# Patient Record
Sex: Female | Born: 1960 | Race: Black or African American | Hispanic: No | Marital: Single | Smoking: Former smoker
Health system: Southern US, Community
[De-identification: ages and names within clinical notes are randomized; demographics above are authoritative.]

## PROBLEM LIST (undated history)

## (undated) DIAGNOSIS — L8990 Pressure ulcer of unspecified site, unspecified stage: Secondary | ICD-10-CM

## (undated) DIAGNOSIS — I10 Essential (primary) hypertension: Secondary | ICD-10-CM

## (undated) DIAGNOSIS — G35 Multiple sclerosis: Secondary | ICD-10-CM

## (undated) DIAGNOSIS — F32A Depression, unspecified: Secondary | ICD-10-CM

## (undated) DIAGNOSIS — I428 Other cardiomyopathies: Secondary | ICD-10-CM

## (undated) DIAGNOSIS — E2740 Unspecified adrenocortical insufficiency: Secondary | ICD-10-CM

## (undated) DIAGNOSIS — F329 Major depressive disorder, single episode, unspecified: Secondary | ICD-10-CM

## (undated) DIAGNOSIS — I5020 Unspecified systolic (congestive) heart failure: Secondary | ICD-10-CM

## (undated) DIAGNOSIS — I5022 Chronic systolic (congestive) heart failure: Secondary | ICD-10-CM

## (undated) DIAGNOSIS — E119 Type 2 diabetes mellitus without complications: Secondary | ICD-10-CM

## (undated) DIAGNOSIS — D638 Anemia in other chronic diseases classified elsewhere: Secondary | ICD-10-CM

## (undated) DIAGNOSIS — I471 Supraventricular tachycardia, unspecified: Secondary | ICD-10-CM

## (undated) DIAGNOSIS — I429 Cardiomyopathy, unspecified: Secondary | ICD-10-CM

## (undated) DIAGNOSIS — I700 Atherosclerosis of aorta: Secondary | ICD-10-CM

## (undated) DIAGNOSIS — H409 Unspecified glaucoma: Secondary | ICD-10-CM

## (undated) HISTORY — PX: ABLATION: SHX5711

## (undated) HISTORY — DX: Chronic systolic (congestive) heart failure: I50.22

## (undated) HISTORY — DX: Cardiomyopathy, unspecified: I42.9

---

## 1997-02-03 ENCOUNTER — Encounter (INDEPENDENT_AMBULATORY_CARE_PROVIDER_SITE_OTHER): Payer: Self-pay | Admitting: Nurse Practitioner

## 1997-02-03 LAB — CONVERTED CEMR LAB

## 1997-10-04 ENCOUNTER — Other Ambulatory Visit: Admission: RE | Admit: 1997-10-04 | Discharge: 1997-10-04 | Payer: Self-pay | Admitting: Family Medicine

## 1997-11-01 ENCOUNTER — Other Ambulatory Visit: Admission: RE | Admit: 1997-11-01 | Discharge: 1997-11-01 | Payer: Self-pay

## 1999-05-12 ENCOUNTER — Inpatient Hospital Stay (HOSPITAL_COMMUNITY): Admission: EM | Admit: 1999-05-12 | Discharge: 1999-05-17 | Payer: Self-pay

## 1999-05-13 ENCOUNTER — Encounter: Payer: Self-pay | Admitting: Neurology

## 2000-01-11 ENCOUNTER — Ambulatory Visit (HOSPITAL_COMMUNITY): Admission: RE | Admit: 2000-01-11 | Discharge: 2000-01-11 | Payer: Self-pay | Admitting: Internal Medicine

## 2000-01-18 ENCOUNTER — Encounter: Payer: Self-pay | Admitting: Internal Medicine

## 2000-01-18 ENCOUNTER — Encounter: Admission: RE | Admit: 2000-01-18 | Discharge: 2000-01-18 | Payer: Self-pay | Admitting: Internal Medicine

## 2000-01-30 ENCOUNTER — Emergency Department (HOSPITAL_COMMUNITY): Admission: EM | Admit: 2000-01-30 | Discharge: 2000-01-30 | Payer: Self-pay

## 2000-02-20 ENCOUNTER — Ambulatory Visit (HOSPITAL_COMMUNITY): Admission: RE | Admit: 2000-02-20 | Discharge: 2000-02-20 | Payer: Self-pay | Admitting: *Deleted

## 2000-02-20 ENCOUNTER — Encounter: Payer: Self-pay | Admitting: *Deleted

## 2000-07-22 ENCOUNTER — Encounter: Payer: Self-pay | Admitting: Internal Medicine

## 2000-07-22 ENCOUNTER — Encounter: Admission: RE | Admit: 2000-07-22 | Discharge: 2000-07-22 | Payer: Self-pay | Admitting: Internal Medicine

## 2000-09-10 ENCOUNTER — Encounter: Admission: RE | Admit: 2000-09-10 | Discharge: 2000-09-10 | Payer: Self-pay | Admitting: Obstetrics & Gynecology

## 2000-11-29 ENCOUNTER — Encounter: Admission: RE | Admit: 2000-11-29 | Discharge: 2000-11-29 | Payer: Self-pay | Admitting: Obstetrics & Gynecology

## 2000-12-30 ENCOUNTER — Ambulatory Visit (HOSPITAL_COMMUNITY): Admission: RE | Admit: 2000-12-30 | Discharge: 2000-12-30 | Payer: Self-pay | Admitting: Obstetrics & Gynecology

## 2001-02-13 ENCOUNTER — Encounter: Payer: Self-pay | Admitting: Family Medicine

## 2001-02-13 ENCOUNTER — Encounter: Admission: RE | Admit: 2001-02-13 | Discharge: 2001-02-13 | Payer: Self-pay | Admitting: Family Medicine

## 2002-02-20 ENCOUNTER — Encounter: Admission: RE | Admit: 2002-02-20 | Discharge: 2002-02-20 | Payer: Self-pay | Admitting: Family Medicine

## 2002-02-20 ENCOUNTER — Encounter: Payer: Self-pay | Admitting: Family Medicine

## 2003-04-01 ENCOUNTER — Encounter: Admission: RE | Admit: 2003-04-01 | Discharge: 2003-04-01 | Payer: Self-pay | Admitting: Family Medicine

## 2003-04-01 ENCOUNTER — Encounter: Payer: Self-pay | Admitting: Family Medicine

## 2003-09-13 ENCOUNTER — Encounter: Admission: RE | Admit: 2003-09-13 | Discharge: 2003-09-13 | Payer: Self-pay | Admitting: Obstetrics & Gynecology

## 2004-04-07 ENCOUNTER — Encounter: Admission: RE | Admit: 2004-04-07 | Discharge: 2004-04-07 | Payer: Self-pay | Admitting: Family Medicine

## 2005-01-12 ENCOUNTER — Ambulatory Visit: Payer: Self-pay | Admitting: Internal Medicine

## 2005-01-19 ENCOUNTER — Ambulatory Visit: Payer: Self-pay | Admitting: Internal Medicine

## 2005-03-14 ENCOUNTER — Ambulatory Visit: Payer: Self-pay | Admitting: Nurse Practitioner

## 2005-10-26 ENCOUNTER — Encounter: Admission: RE | Admit: 2005-10-26 | Discharge: 2005-10-26 | Payer: Self-pay | Admitting: Family Medicine

## 2005-11-20 ENCOUNTER — Other Ambulatory Visit: Admission: RE | Admit: 2005-11-20 | Discharge: 2005-11-20 | Payer: Self-pay | Admitting: Obstetrics and Gynecology

## 2006-04-18 ENCOUNTER — Ambulatory Visit: Payer: Self-pay | Admitting: Nurse Practitioner

## 2006-06-01 ENCOUNTER — Ambulatory Visit (HOSPITAL_COMMUNITY): Admission: RE | Admit: 2006-06-01 | Discharge: 2006-06-01 | Payer: Self-pay | Admitting: Neurology

## 2007-04-24 ENCOUNTER — Ambulatory Visit: Payer: Self-pay | Admitting: Family Medicine

## 2007-04-24 ENCOUNTER — Encounter (INDEPENDENT_AMBULATORY_CARE_PROVIDER_SITE_OTHER): Payer: Self-pay | Admitting: Nurse Practitioner

## 2007-04-24 LAB — CONVERTED CEMR LAB
ALT: 17 units/L (ref 0–35)
AST: 15 units/L (ref 0–37)
Albumin: 4.1 g/dL (ref 3.5–5.2)
Alkaline Phosphatase: 88 units/L (ref 39–117)
BUN: 12 mg/dL (ref 6–23)
Basophils Absolute: 0.1 10*3/uL (ref 0.0–0.1)
Chloride: 105 meq/L (ref 96–112)
Eosinophils Relative: 6 % — ABNORMAL HIGH (ref 0–5)
HCT: 32.4 % — ABNORMAL LOW (ref 36.0–46.0)
MCHC: 34.3 g/dL (ref 30.0–36.0)
MCV: 79 fL (ref 78.0–100.0)
Monocytes Relative: 10 % (ref 3–11)
Neutro Abs: 3.2 10*3/uL (ref 1.7–7.7)
Neutrophils Relative %: 64 % (ref 43–77)
Sodium: 142 meq/L (ref 135–145)
Total Protein: 7.4 g/dL (ref 6.0–8.3)
WBC: 5.1 10*3/uL (ref 4.0–10.5)

## 2007-06-16 ENCOUNTER — Encounter: Admission: RE | Admit: 2007-06-16 | Discharge: 2007-06-16 | Payer: Self-pay | Admitting: Family Medicine

## 2008-04-11 ENCOUNTER — Emergency Department (HOSPITAL_COMMUNITY): Admission: EM | Admit: 2008-04-11 | Discharge: 2008-04-11 | Payer: Self-pay | Admitting: Emergency Medicine

## 2008-07-23 ENCOUNTER — Encounter: Admission: RE | Admit: 2008-07-23 | Discharge: 2008-07-23 | Payer: Self-pay | Admitting: Nephrology

## 2008-08-11 ENCOUNTER — Encounter (INDEPENDENT_AMBULATORY_CARE_PROVIDER_SITE_OTHER): Payer: Self-pay | Admitting: Nurse Practitioner

## 2008-08-16 ENCOUNTER — Encounter (INDEPENDENT_AMBULATORY_CARE_PROVIDER_SITE_OTHER): Payer: Self-pay | Admitting: Nurse Practitioner

## 2008-10-12 ENCOUNTER — Encounter: Admission: RE | Admit: 2008-10-12 | Discharge: 2009-01-10 | Payer: Self-pay | Admitting: Neurology

## 2008-11-30 ENCOUNTER — Encounter (INDEPENDENT_AMBULATORY_CARE_PROVIDER_SITE_OTHER): Payer: Self-pay | Admitting: Nurse Practitioner

## 2008-11-30 DIAGNOSIS — D539 Nutritional anemia, unspecified: Secondary | ICD-10-CM

## 2008-11-30 DIAGNOSIS — G35 Multiple sclerosis: Secondary | ICD-10-CM | POA: Insufficient documentation

## 2008-11-30 DIAGNOSIS — L259 Unspecified contact dermatitis, unspecified cause: Secondary | ICD-10-CM | POA: Insufficient documentation

## 2008-11-30 DIAGNOSIS — I110 Hypertensive heart disease with heart failure: Secondary | ICD-10-CM

## 2008-11-30 DIAGNOSIS — E669 Obesity, unspecified: Secondary | ICD-10-CM

## 2009-05-19 ENCOUNTER — Emergency Department (HOSPITAL_COMMUNITY): Admission: EM | Admit: 2009-05-19 | Discharge: 2009-05-19 | Payer: Self-pay | Admitting: Emergency Medicine

## 2009-08-12 ENCOUNTER — Encounter: Admission: RE | Admit: 2009-08-12 | Discharge: 2009-08-12 | Payer: Self-pay | Admitting: Nephrology

## 2009-10-10 ENCOUNTER — Ambulatory Visit (HOSPITAL_COMMUNITY): Admission: RE | Admit: 2009-10-10 | Discharge: 2009-10-10 | Payer: Self-pay | Admitting: Obstetrics and Gynecology

## 2010-03-31 ENCOUNTER — Encounter: Admission: RE | Admit: 2010-03-31 | Discharge: 2010-04-14 | Payer: Self-pay | Admitting: Neurology

## 2010-07-23 ENCOUNTER — Encounter: Payer: Self-pay | Admitting: Obstetrics & Gynecology

## 2010-07-31 ENCOUNTER — Other Ambulatory Visit: Payer: Self-pay | Admitting: Nephrology

## 2010-07-31 DIAGNOSIS — Z1239 Encounter for other screening for malignant neoplasm of breast: Secondary | ICD-10-CM

## 2010-08-21 ENCOUNTER — Ambulatory Visit: Payer: Self-pay

## 2010-09-05 ENCOUNTER — Ambulatory Visit (HOSPITAL_COMMUNITY)
Admission: RE | Admit: 2010-09-05 | Discharge: 2010-09-05 | Disposition: A | Discharge status: Home / Self Care | Payer: Medicare Other | Source: Ambulatory Visit | Attending: Nephrology | Admitting: Nephrology

## 2010-09-05 DIAGNOSIS — Z79899 Other long term (current) drug therapy: Secondary | ICD-10-CM | POA: Insufficient documentation

## 2010-09-05 DIAGNOSIS — E785 Hyperlipidemia, unspecified: Secondary | ICD-10-CM | POA: Insufficient documentation

## 2010-09-05 DIAGNOSIS — I10 Essential (primary) hypertension: Secondary | ICD-10-CM | POA: Insufficient documentation

## 2010-09-05 DIAGNOSIS — G35 Multiple sclerosis: Secondary | ICD-10-CM | POA: Insufficient documentation

## 2010-09-20 LAB — HCG, SERUM, QUALITATIVE: Preg, Serum: NEGATIVE

## 2010-09-20 LAB — CBC
HCT: 33.6 % — ABNORMAL LOW (ref 36.0–46.0)
MCV: 78.6 fL (ref 78.0–100.0)
Platelets: 271 10*3/uL (ref 150–400)

## 2010-10-04 LAB — CBC
HCT: 31 % — ABNORMAL LOW (ref 36.0–46.0)
RDW: 16.9 % — ABNORMAL HIGH (ref 11.5–15.5)

## 2010-10-04 LAB — COMPREHENSIVE METABOLIC PANEL
ALT: 14 U/L (ref 0–35)
Alkaline Phosphatase: 94 U/L (ref 39–117)
BUN: 8 mg/dL (ref 6–23)
Calcium: 8.7 mg/dL (ref 8.4–10.5)
Creatinine, Ser: 0.78 mg/dL (ref 0.4–1.2)
GFR calc non Af Amer: >60 mL/min (ref >60)
Glucose, Bld: 152 mg/dL — ABNORMAL HIGH (ref 70–99)
Sodium: 138 mEq/L (ref 135–145)
Total Bilirubin: 0.4 mg/dL (ref 0.3–1.2)

## 2010-10-04 LAB — URINALYSIS, ROUTINE W REFLEX MICROSCOPIC
Bilirubin Urine: NEGATIVE
Glucose, UA: NEGATIVE mg/dL
Hgb urine dipstick: NEGATIVE
Specific Gravity, Urine: 1.014 (ref 1.005–1.030)
Urobilinogen, UA: 0.2 mg/dL (ref 0.0–1.0)
pH: 6.5 (ref 5.0–8.0)

## 2010-10-04 LAB — CK TOTAL AND CKMB (NOT AT ARMC)
CK, MB: 2 ng/mL (ref 0.3–4.0)
Relative Index: 1.9 (ref 0.0–2.5)

## 2010-10-04 LAB — DIFFERENTIAL
Eosinophils Relative: 8 % — ABNORMAL HIGH (ref 0–5)
Lymphs Abs: 1.3 10*3/uL (ref 0.7–4.0)
Monocytes Absolute: 0.5 10*3/uL (ref 0.1–1.0)
Monocytes Relative: 7 % (ref 3–12)
Neutrophils Relative %: 64 % (ref 43–77)

## 2010-12-27 ENCOUNTER — Ambulatory Visit (HOSPITAL_COMMUNITY)
Admission: RE | Admit: 2010-12-27 | Discharge: 2010-12-27 | Disposition: A | Discharge status: Home / Self Care | Payer: PRIVATE HEALTH INSURANCE | Source: Ambulatory Visit | Attending: Neurology | Admitting: Neurology

## 2010-12-27 DIAGNOSIS — C50919 Malignant neoplasm of unspecified site of unspecified female breast: Secondary | ICD-10-CM | POA: Insufficient documentation

## 2010-12-27 DIAGNOSIS — I10 Essential (primary) hypertension: Secondary | ICD-10-CM | POA: Insufficient documentation

## 2010-12-27 DIAGNOSIS — I359 Nonrheumatic aortic valve disorder, unspecified: Secondary | ICD-10-CM

## 2010-12-27 DIAGNOSIS — Z01818 Encounter for other preprocedural examination: Secondary | ICD-10-CM | POA: Insufficient documentation

## 2010-12-27 DIAGNOSIS — I080 Rheumatic disorders of both mitral and aortic valves: Secondary | ICD-10-CM | POA: Insufficient documentation

## 2011-04-03 LAB — POCT I-STAT, CHEM 8
BUN: 7
Creatinine, Ser: 0.9
Glucose, Bld: 91
HCT: 36
Potassium: 3.2 — ABNORMAL LOW
Sodium: 142
TCO2: 30

## 2011-04-03 LAB — URINALYSIS, ROUTINE W REFLEX MICROSCOPIC
Bilirubin Urine: NEGATIVE
Hgb urine dipstick: NEGATIVE
Ketones, ur: NEGATIVE
Protein, ur: NEGATIVE
Specific Gravity, Urine: 1.015
pH: 6

## 2011-04-03 LAB — URINE CULTURE

## 2011-04-03 LAB — URINE MICROSCOPIC-ADD ON

## 2011-04-18 ENCOUNTER — Emergency Department (HOSPITAL_COMMUNITY)
Admission: EM | Admit: 2011-04-18 | Discharge: 2011-04-18 | Disposition: A | Discharge status: Home / Self Care | Payer: PRIVATE HEALTH INSURANCE | Attending: Emergency Medicine | Admitting: Emergency Medicine

## 2011-04-18 ENCOUNTER — Emergency Department (HOSPITAL_COMMUNITY): Payer: PRIVATE HEALTH INSURANCE

## 2011-04-18 DIAGNOSIS — Y92009 Unspecified place in unspecified non-institutional (private) residence as the place of occurrence of the external cause: Secondary | ICD-10-CM | POA: Insufficient documentation

## 2011-04-18 DIAGNOSIS — Z79899 Other long term (current) drug therapy: Secondary | ICD-10-CM | POA: Insufficient documentation

## 2011-04-18 DIAGNOSIS — I10 Essential (primary) hypertension: Secondary | ICD-10-CM | POA: Insufficient documentation

## 2011-04-18 DIAGNOSIS — S82899A Other fracture of unspecified lower leg, initial encounter for closed fracture: Secondary | ICD-10-CM | POA: Insufficient documentation

## 2011-04-18 DIAGNOSIS — W19XXXA Unspecified fall, initial encounter: Secondary | ICD-10-CM | POA: Insufficient documentation

## 2011-04-18 DIAGNOSIS — Y998 Other external cause status: Secondary | ICD-10-CM | POA: Insufficient documentation

## 2011-04-18 LAB — COMPREHENSIVE METABOLIC PANEL
ALT: 20 U/L (ref 0–35)
AST: 20 U/L (ref 0–37)
Albumin: 3.3 g/dL — ABNORMAL LOW (ref 3.5–5.2)
Alkaline Phosphatase: 131 U/L — ABNORMAL HIGH (ref 39–117)
Potassium: 3 mEq/L — ABNORMAL LOW (ref 3.5–5.1)
Sodium: 141 mEq/L (ref 135–145)
Total Protein: 7 g/dL (ref 6.0–8.3)

## 2011-04-18 LAB — DIFFERENTIAL
Basophils Absolute: 0 10*3/uL (ref 0.0–0.1)
Lymphocytes Relative: 12 % (ref 12–46)
Lymphs Abs: 0.6 10*3/uL — ABNORMAL LOW (ref 0.7–4.0)
Monocytes Absolute: 0.3 10*3/uL (ref 0.1–1.0)
Neutro Abs: 3.6 10*3/uL (ref 1.7–7.7)

## 2011-04-18 LAB — CBC
HCT: 33.8 % — ABNORMAL LOW (ref 36.0–46.0)
Hemoglobin: 11.4 g/dL — ABNORMAL LOW (ref 12.0–15.0)
MCV: 79.7 fL (ref 78.0–100.0)
WBC: 4.9 10*3/uL (ref 4.0–10.5)

## 2011-04-18 LAB — URINALYSIS, ROUTINE W REFLEX MICROSCOPIC
Glucose, UA: NEGATIVE mg/dL
Hgb urine dipstick: NEGATIVE
Specific Gravity, Urine: 1.008 (ref 1.005–1.030)

## 2011-04-18 LAB — CK TOTAL AND CKMB (NOT AT ARMC)
CK, MB: 3.4 ng/mL (ref 0.3–4.0)
Relative Index: 1.1 (ref 0.0–2.5)
Total CK: 303 U/L — ABNORMAL HIGH (ref 7–177)

## 2011-06-01 ENCOUNTER — Encounter: Payer: Self-pay | Admitting: Emergency Medicine

## 2011-06-01 ENCOUNTER — Emergency Department (HOSPITAL_COMMUNITY)
Admission: EM | Admit: 2011-06-01 | Discharge: 2011-06-01 | Disposition: A | Discharge status: Home / Self Care | Payer: Medicare Other | Attending: Emergency Medicine | Admitting: Emergency Medicine

## 2011-06-01 ENCOUNTER — Emergency Department (HOSPITAL_COMMUNITY): Payer: Medicare Other

## 2011-06-01 DIAGNOSIS — G35 Multiple sclerosis: Secondary | ICD-10-CM | POA: Insufficient documentation

## 2011-06-01 DIAGNOSIS — M25569 Pain in unspecified knee: Secondary | ICD-10-CM | POA: Insufficient documentation

## 2011-06-01 DIAGNOSIS — W19XXXA Unspecified fall, initial encounter: Secondary | ICD-10-CM

## 2011-06-01 DIAGNOSIS — W06XXXA Fall from bed, initial encounter: Secondary | ICD-10-CM | POA: Insufficient documentation

## 2011-06-01 DIAGNOSIS — M25579 Pain in unspecified ankle and joints of unspecified foot: Secondary | ICD-10-CM | POA: Insufficient documentation

## 2011-06-01 DIAGNOSIS — S82409A Unspecified fracture of shaft of unspecified fibula, initial encounter for closed fracture: Secondary | ICD-10-CM

## 2011-06-01 DIAGNOSIS — R51 Headache: Secondary | ICD-10-CM | POA: Insufficient documentation

## 2011-06-01 DIAGNOSIS — S82899A Other fracture of unspecified lower leg, initial encounter for closed fracture: Secondary | ICD-10-CM | POA: Insufficient documentation

## 2011-06-01 MED ORDER — OXYCODONE-ACETAMINOPHEN 5-325 MG PO TABS
2.0000 | ORAL_TABLET | Freq: Once | ORAL | Status: AC
  Administered 2011-06-01: 2 via ORAL
  Filled 2011-06-01: qty 2

## 2011-06-01 MED ORDER — HYDROCODONE-ACETAMINOPHEN 5-325 MG PO TABS: 2.0000 | ORAL_TABLET | ORAL | Status: AC | PRN

## 2011-06-01 NOTE — ED Notes (Signed)
Left Knee immobilizer applied--Pt. Tolerated well

## 2011-06-01 NOTE — ED Notes (Signed)
Pt. Has MS and per home health nurse-she fell trying to get out of bed--pt. Is alert and oriented---c/o left knee, right ankle pain and headache from hitting head on the furniture during fall.  No obvious areas of injury and no LOC

## 2011-06-01 NOTE — ED Notes (Signed)
Patient transported to X-ray 

## 2011-06-01 NOTE — ED Notes (Signed)
Ambulance called to transport pt home

## 2011-06-01 NOTE — ED Provider Notes (Signed)
History     CSN: [id number] Arrival date & time: 06/01/2011  2:43 PM   First MD Initiated Contact with Patient 06/01/11 1453      Chief Complaint  Patient presents with  . Fall    (Consider location/radiation/quality/duration/timing/severity/associated sxs/prior treatment) HPI Comments: Presents after mechanical fall trying to get out of bed. She has multiple sclerosis. Her aide Asked her to sit down but she lost balance and she fell before she could do so. She hit her head on the dresser is uncertain if he lost consciousness. His pain the back of her head, left knee and right ankle.  She denies any nausea, vomiting, chest pain, shortness of breath, lightheadedness or dizziness. She is minimally ambulatory at baseline with a walker and scooter.  She is not using any Coumadin or aspirin.  Denies any weakness, numbness, tingling, bowel or bladder incontinence. She does not have any C-spine pain, back pain, abdominal pain or chest pain. She has not tried to walk since the fall. She took some naproxen for pain which was minimal relief.  She has a previous fracture of R distal fibula.  The history is provided by the patient and the EMS personnel.    History reviewed. No pertinent past medical history.  History reviewed. No pertinent past surgical history.  History reviewed. No pertinent family history.  History  Substance Use Topics  . Smoking status: Not on file  . Smokeless tobacco: Not on file  . Alcohol Use: Not on file    OB History    Grav Para Term Preterm Abortions TAB SAB Ect Mult Living                  Review of Systems  All other systems reviewed and are negative.    Allergies  Sulfonamide derivatives and Penicillins  Home Medications   Current Outpatient Rx  Name Route Sig Dispense Refill  . AMANTADINE HCL 100 MG PO CAPS Oral Take 100 mg by mouth 2 (two) times daily.      Marland Kitchen VITAMIN D3 PO Oral Take 1 tablet by mouth daily.      Marland Kitchen CLONAZEPAM 1 MG PO TABS  Oral Take 2 mg by mouth at bedtime.      Marland Kitchen VITAMIN B12 PO Oral Take 1 tablet by mouth daily.      . FUROSEMIDE 40 MG PO TABS Oral Take 40 mg by mouth daily.      Marland Kitchen GABAPENTIN 600 MG PO TABS Oral Take 600 mg by mouth 3 (three) times daily.      . IBUPROFEN 200 MG PO TABS Oral Take 400 mg by mouth every 8 (eight) hours as needed. For pain.     . IMIPRAMINE HCL 50 MG PO TABS Oral Take 100 mg by mouth at bedtime.      . SOLIFENACIN SUCCINATE 10 MG PO TABS Oral Take 5 mg by mouth 2 (two) times daily.      . TRAVOPROST (BAK FREE) 0.004 % OP SOLN  1 drop at bedtime.      Marland Kitchen ZOLPIDEM TARTRATE 5 MG PO TABS Oral Take 5 mg by mouth at bedtime.      Marland Kitchen HYDROCODONE-ACETAMINOPHEN 5-325 MG PO TABS Oral Take 2 tablets by mouth every 4 (four) hours as needed for pain. 10 tablet 0    BP 160/103  Pulse 89  Temp(Src) 98.4 F (36.9 C) (Oral)  Resp 22  SpO2 98%  Physical Exam  Constitutional: She is oriented to person, place, and  time. She appears well-developed and well-nourished. No distress.  HENT:  Head: Normocephalic and atraumatic.  Right Ear: External ear normal.  Left Ear: External ear normal.  Mouth/Throat: Oropharynx is clear and moist. No oropharyngeal exudate.       No septal hematoma or hemotympanum  Eyes: Conjunctivae and EOM are normal. Pupils are equal, round, and reactive to light.  Neck: Normal range of motion. Neck supple.       No C-spine pain, step-off or deformity  Cardiovascular: Normal rate, regular rhythm and normal heart sounds.   Pulmonary/Chest: Effort normal and breath sounds normal. No respiratory distress.  Abdominal: Soft. There is no tenderness. There is no rebound and no guarding.  Musculoskeletal: Normal range of motion. She exhibits tenderness.       Tenderness to palpation diffusely over left patella. Her range of motion is limited at baseline given her MS. She is able to passively flex and extend the knee and she is able to actively extend it. +2 DP and PT pulses.  No  appreciable ligament instability but limited by body habitus.  Tenderness palpation of the right lateral malleolus without deformity. His foot is neurovascularly intact with +2 DP and PT pulses  Neurological: She is alert and oriented to person, place, and time. No cranial nerve deficit.  Skin: Skin is warm.    ED Course  Procedures (including critical care time)  Labs Reviewed - No data to display Dg Ankle Complete Right  06/01/2011  *RADIOLOGY REPORT*  Clinical Data: Larey Seat today with pain  RIGHT ANKLE - COMPLETE 3+ VIEW  Comparison: Right ankle films of 04/18/2011  Findings: There appears be widening of the fracture line through the distal right fibula with slight displacement.  This may indicate refracture through the previous fracture site.  The ankle joint appears normal.  IMPRESSION: Suspect refracture through the oblique distal right fibular fracture site.  Original Report Authenticated By: Juline Patch, M.D.   Ct Head Wo Contrast  06/01/2011  *RADIOLOGY REPORT*  Clinical Data: Fall striking back of head, headache, weakness, history of multiple sclerosis  CT HEAD WITHOUT CONTRAST  Technique:  Contiguous axial images were obtained from the base of the skull through the vertex without contrast.  Comparison: None Correlation:  MRI brain 06/01/2006  Findings: Minimal atrophy. Normal ventricular morphology. No midline shift or mass effect. Extensive low attenuation throughout deep cerebral white matter. This could be related to small vessel chronic ischemic changes or multiple sclerosis.  No intracranial hemorrhage, mass lesion or evidence of acute infarction. No extra-axial fluid collections. Visualized paranasal sinuses and mastoid air cells clear. Skull intact.  IMPRESSION: Extensive white matter hypoattenuation which may represent small vessel chronic ischemic changes or could be related to patient's history of multiple sclerosis, with similar changes noted on prior MR. No other intracranial  abnormalities.  Original Report Authenticated By: Lollie Marrow, M.D.   Dg Knee Complete 4 Views Left  06/01/2011  *RADIOLOGY REPORT*  Clinical Data: Fall, left knee pain.  LEFT KNEE - COMPLETE 4+ VIEW  Comparison: None  Findings: No acute bony abnormality.  Specifically, no fracture, subluxation, or dislocation.  Soft tissues are intact.  No joint effusion.  IMPRESSION: No acute bony abnormality.  Original Report Authenticated By: Cyndie Chime, M.D.     1. Fibula fracture   2. Fall       MDM  Mechanical fall with questionable loss of consciousness, knee pain and ankle pain. Patient is alert and oriented and at her neurological  baseline. Check plain films of her knee and ankle and treat her pain.   Results discussed with patient including refracture of R fibula with slight displacement.  Patient has seen Dr. Elvin So of podiatry regarding this fracture.  I discussed this with Dr. Elvin So. As patient is minimally ambulatory at baseline will not pursue aggressive fracture management.  He agrees with fracture boot and outpatient followup.  Patient has followup appointment on Dec 12.   Glynn Octave, MD 06/01/11 [phone]

## 2011-11-19 ENCOUNTER — Encounter (HOSPITAL_COMMUNITY): Payer: Self-pay | Admitting: Emergency Medicine

## 2011-11-19 ENCOUNTER — Emergency Department (HOSPITAL_COMMUNITY)
Admission: EM | Admit: 2011-11-19 | Discharge: 2011-11-19 | Disposition: A | Discharge status: Home / Self Care | Payer: Medicare Other | Attending: Emergency Medicine | Admitting: Emergency Medicine

## 2011-11-19 ENCOUNTER — Emergency Department (HOSPITAL_COMMUNITY): Payer: Medicare Other

## 2011-11-19 DIAGNOSIS — K0889 Other specified disorders of teeth and supporting structures: Secondary | ICD-10-CM

## 2011-11-19 DIAGNOSIS — H409 Unspecified glaucoma: Secondary | ICD-10-CM | POA: Insufficient documentation

## 2011-11-19 DIAGNOSIS — K029 Dental caries, unspecified: Secondary | ICD-10-CM | POA: Insufficient documentation

## 2011-11-19 DIAGNOSIS — G35 Multiple sclerosis: Secondary | ICD-10-CM | POA: Insufficient documentation

## 2011-11-19 DIAGNOSIS — K1370 Unspecified lesions of oral mucosa: Secondary | ICD-10-CM | POA: Insufficient documentation

## 2011-11-19 DIAGNOSIS — R221 Localized swelling, mass and lump, neck: Secondary | ICD-10-CM | POA: Insufficient documentation

## 2011-11-19 DIAGNOSIS — E876 Hypokalemia: Secondary | ICD-10-CM | POA: Insufficient documentation

## 2011-11-19 DIAGNOSIS — K089 Disorder of teeth and supporting structures, unspecified: Secondary | ICD-10-CM | POA: Insufficient documentation

## 2011-11-19 DIAGNOSIS — I10 Essential (primary) hypertension: Secondary | ICD-10-CM | POA: Insufficient documentation

## 2011-11-19 DIAGNOSIS — R220 Localized swelling, mass and lump, head: Secondary | ICD-10-CM | POA: Insufficient documentation

## 2011-11-19 HISTORY — DX: Multiple sclerosis: G35

## 2011-11-19 HISTORY — DX: Major depressive disorder, single episode, unspecified: F32.9

## 2011-11-19 HISTORY — DX: Depression, unspecified: F32.A

## 2011-11-19 HISTORY — DX: Essential (primary) hypertension: I10

## 2011-11-19 LAB — DIFFERENTIAL
Basophils Absolute: 0 10*3/uL (ref 0.0–0.1)
Basophils Relative: 1 % (ref 0–1)
Eosinophils Relative: 5 % (ref 0–5)
Monocytes Absolute: 0.2 10*3/uL (ref 0.1–1.0)
Monocytes Relative: 5 % (ref 3–12)

## 2011-11-19 LAB — CBC
HCT: 34 % — ABNORMAL LOW (ref 36.0–46.0)
MCH: 28.1 pg (ref 26.0–34.0)
MCHC: 34.4 g/dL (ref 30.0–36.0)
MCV: 81.7 fL (ref 78.0–100.0)
RDW: 14.9 % (ref 11.5–15.5)

## 2011-11-19 LAB — BASIC METABOLIC PANEL
BUN: 14 mg/dL (ref 6–23)
Calcium: 8.1 mg/dL — ABNORMAL LOW (ref 8.4–10.5)
Creatinine, Ser: 0.83 mg/dL (ref 0.50–1.10)
GFR calc Af Amer: >90 mL/min (ref >90)

## 2011-11-19 MED ORDER — OXYCODONE-ACETAMINOPHEN 5-325 MG PO TABS
1.0000 | ORAL_TABLET | Freq: Once | ORAL | Status: AC
  Administered 2011-11-19: 1 via ORAL
  Filled 2011-11-19: qty 1

## 2011-11-19 MED ORDER — CLINDAMYCIN HCL 150 MG PO CAPS: 150.0000 mg | ORAL_CAPSULE | Freq: Four times a day (QID) | ORAL | Status: AC

## 2011-11-19 MED ORDER — IOHEXOL 300 MG/ML  SOLN
100.0000 mL | Freq: Once | INTRAMUSCULAR | Status: AC | PRN
  Administered 2011-11-19: 100 mL via INTRAVENOUS

## 2011-11-19 MED ORDER — SODIUM CHLORIDE 0.9 % IV SOLN
Freq: Once | INTRAVENOUS | Status: AC
  Administered 2011-11-19: 18:00:00 via INTRAVENOUS

## 2011-11-19 MED ORDER — CLINDAMYCIN PHOSPHATE 600 MG/50ML IV SOLN
600.0000 mg | Freq: Once | INTRAVENOUS | Status: AC
  Administered 2011-11-19: 600 mg via INTRAVENOUS
  Filled 2011-11-19: qty 50

## 2011-11-19 MED ORDER — POTASSIUM CHLORIDE CRYS ER 20 MEQ PO TBCR
40.0000 meq | EXTENDED_RELEASE_TABLET | Freq: Once | ORAL | Status: AC
  Administered 2011-11-19: 40 meq via ORAL
  Filled 2011-11-19: qty 2

## 2011-11-19 MED ORDER — OXYCODONE-ACETAMINOPHEN 5-325 MG PO TABS: 1.0000 | ORAL_TABLET | ORAL | Status: AC | PRN

## 2011-11-19 MED ORDER — DEXAMETHASONE SODIUM PHOSPHATE 10 MG/ML IJ SOLN
10.0000 mg | Freq: Once | INTRAMUSCULAR | Status: AC
  Administered 2011-11-19: 10 mg via INTRAVENOUS
  Filled 2011-11-19: qty 1

## 2011-11-19 NOTE — ED Notes (Signed)
Social called informed that pt having difficulty getting home d/t pt has motorized w/c. Spoke with social work and stated they would work on getting pt home.

## 2011-11-19 NOTE — ED Provider Notes (Signed)
History     CSN: [id number]  Arrival date & time 11/19/11  1527   First MD Initiated Contact with Patient 11/19/11 1705      Chief Complaint  Patient presents with  . Oral Swelling    pt reports 3 day hx of swelling on l/side of mouth    (Consider location/radiation/quality/duration/timing/severity/associated sxs/prior treatment) Patient is a 51 y.o. female presenting with tooth pain. The history is provided by the patient.  Dental PainThe primary symptoms include mouth pain and oral lesions. Primary symptoms do not include dental injury, headaches, fever, shortness of breath, sore throat, angioedema or cough. The symptoms began 3 to 5 days ago. The symptoms are worsening. The symptoms are new. The symptoms occur constantly.  Affected locations include: cheek(s).  Additional symptoms include: gum tenderness, trismus and facial swelling. Additional symptoms do not include: trouble swallowing, pain with swallowing, drooling and swollen glands. Medical issues include: periodontal disease.    Past Medical History  Diagnosis Date  . MS (multiple sclerosis)   . Hypertension   . Depression   . Glaucoma     Past Surgical History  Procedure Date  . Oblation     uterine    Family History  Problem Relation Age of Onset  . Hypertension Mother     History  Substance Use Topics  . Smoking status: Never Smoker   . Smokeless tobacco: Not on file  . Alcohol Use: Yes    OB History    Grav Para Term Preterm Abortions TAB SAB Ect Mult Living                  Review of Systems  Constitutional: Negative for fever and chills.  HENT: Positive for facial swelling and dental problem. Negative for sore throat, drooling and trouble swallowing.   Respiratory: Negative for cough and shortness of breath.   Gastrointestinal: Negative for nausea and vomiting.  Skin: Negative.   Neurological: Negative for headaches.    Allergies  Sulfonamide derivatives and Penicillins  Home  Medications   Current Outpatient Rx  Name Route Sig Dispense Refill  . AMANTADINE HCL 100 MG PO CAPS Oral Take 100 mg by mouth 2 (two) times daily.      Marland Kitchen VITAMIN D3 PO Oral Take 1 tablet by mouth daily.      Marland Kitchen CLONAZEPAM 1 MG PO TABS Oral Take 2 mg by mouth at bedtime.      Marland Kitchen VITAMIN B12 PO Oral Take 1 tablet by mouth daily.      . FUROSEMIDE 40 MG PO TABS Oral Take 40 mg by mouth daily.      Marland Kitchen GABAPENTIN 600 MG PO TABS Oral Take 600 mg by mouth 3 (three) times daily.      . IMIPRAMINE HCL 50 MG PO TABS Oral Take 100 mg by mouth at bedtime.      Marland Kitchen PRESCRIPTION MEDICATION Oral Take 1 tablet by mouth 2 (two) times daily. Tecfidera 240mg     . SOLIFENACIN SUCCINATE 10 MG PO TABS Oral Take 5 mg by mouth 2 (two) times daily.      . TRAVOPROST (BAK FREE) 0.004 % OP SOLN Both Eyes Place 1 drop into both eyes at bedtime.     Marland Kitchen ZOLPIDEM TARTRATE 5 MG PO TABS Oral Take 5 mg by mouth at bedtime.        BP 155/68  Pulse 80  Temp(Src) 97.8 F (36.6 C) (Oral)  Resp 20  SpO2 96%  Physical Exam  Nursing  note and vitals reviewed. Constitutional: She appears well-developed and well-nourished. No distress.       afebrile  HENT:  Head: Normocephalic and atraumatic.  Mouth/Throat:         Significant dental decay; tooth in question is broken No post oropharynx/tongue edema, no obvious abscess Slight trismus - pt only able to open mouth about 3 fingerbreadths Slight tenderness to palpation to floor of mouth Significant swelling to L cheek with possible small area of fluctuance along jaw line Mildly tender to palpation to submental area Nontender to palpation of neck without abscess    Eyes: EOM are normal. Pupils are equal, round, and reactive to light.  Neck: Normal range of motion.  Cardiovascular: Normal rate, regular rhythm and normal heart sounds.   Pulmonary/Chest: Effort normal and breath sounds normal.  Musculoskeletal: Normal range of motion.  Lymphadenopathy:    She has no  cervical adenopathy.  Neurological: She is alert.  Skin: Skin is warm and dry. She is not diaphoretic.  Psychiatric: She has a normal mood and affect.    ED Course  Procedures (including critical care time)  Labs Reviewed  CBC - Abnormal; Notable for the following:    Hemoglobin 11.7 (*)    HCT 34.0 (*)    All other components within normal limits  BASIC METABOLIC PANEL - Abnormal; Notable for the following:    Potassium 2.8 (*)    Glucose, Bld 167 (*)    Calcium 8.1 (*)    GFR calc non Af Amer 81 (*)    All other components within normal limits  DIFFERENTIAL  LAB REPORT - SCANNED   Ct Soft Tissue Neck W Contrast  11/19/2011  *RADIOLOGY REPORT*  Clinical Data: Left cheek swelling and tenderness of the oral cavity.  CT NECK WITH CONTRAST  Technique:  Multidetector CT imaging of the neck was performed with intravenous contrast.  Contrast: OMNIPAQUE IOHEXOL 300 MG/ML  SOLN  Comparison: None.  Findings: No inflammatory process or abnormal fluid collection is identified.  Scattered small bilateral cervical lymph nodes are within normal limits.  No focal mass lesions.  The visualized airway shows normal patency and no evidence of localized soft tissue abnormality.  Opacified vasculature is unremarkable.  The visualized cervical spine demonstrates moderate spondylosis. Paranasal sinuses and mastoid air cells are normally aerated.  IMPRESSION: No acute findings.  Original Report Authenticated By: Reola Calkins, M.D.     1. Pain, dental   2. Facial swelling   3. Hypokalemia       MDM  Pt with significant swelling to L cheek and slight trismus/tenderness to floor of mouth. She is afebrile, handling secretions. Was concerned for large abscess so obtained imaging which shows no acute findings. No leukocytosis. Pt given IV clindamycin and decadron and stated she felt better with this. Labs significant for hypoK (which is likely 2/2 lasix) - repleted. Instructed to call PCP to inquire  re: need for supplementation as she has been hypoK on previous visits as well. Dental resources given. Strict return precautions discussed.        Grant Fontana, [place] 11/20/11 1920

## 2011-11-19 NOTE — ED Notes (Signed)
Pt reports swelling and pain on l/side of lower mouth

## 2011-11-19 NOTE — ED Notes (Signed)
ED CSW contacted [facility] in [place] who stated that they have dispatched a driver to the main entrance of the hospital and will transport the pt home. No further CSW needs identified at this time.

## 2011-11-19 NOTE — Discharge Instructions (Signed)
Your imaging did not show signs of a worrisome abscess at this time. You have been given a prescription for an antibiotic and pain medication. Please take ALL of the antibiotic until gone. Please see the attached dental resources.  Your potassium was low. You have been given medicine to treat this. You may need to be on a daily potassium medication as your Lasix can cause your potassium to get low. Please call your doctor in the morning about this. Please try to eat potassium rich foods over the next several days. Return to the ED for worsening condition.  RESOURCE GUIDE  Dental Problems  Patients with Medicaid: [facility] [phone]                                           [phone] [address] [employer] [address] Phone:  [phone]                                                  Phone:  [phone]  If unable to pay or uninsured, contact:  [facility] or [facility]. to become qualified for the adult dental clinic.  Chronic Pain Problems Contact [facility]  [phone] Patients need to be referred by their primary care doctor.  Insufficient Money for Medicine Contact [employer]:  call "211" or [facility] [phone].  No Primary Care Doctor Call [facility]  [phone] Other agencies that provide inexpensive medical care    [facility]  [phone]    [facility]  [phone]    [facility]  [phone]    [facility]  [phone]    [facility]  [phone]    [facility]  [phone]  [facility]  [phone] [facility]  [phone] [facility]   [phone] (emergency services [phone])  Substance Abuse Resources [facility]  ([phone] [facility] (201)113-2639 [facility] 838-046-8025 Floydene Flock ([phone] [phone] Residential & Outpatient Substance Abuse  Program  ([phone]  Abuse/Neglect [facility] Child Abuse Hotline 463-833-3617 [facility] Child Abuse Hotline 720-530-4638 (After Hours)  Emergency Shelter [facility] 6605132283  M[facility] [phone] [facility] [phone]  MRSA Hotline #:   309-640-6888    [facility] Resources  [facility]     [employer]                          [facility] Dept. [address] [place]                       [address]      [address]  [address]  Cristobal Goldmann Phone:  [phone]                                   Phone:  [phone]                 Phone:  [phone]  [facility] Phone:  [phone]  [facility] Child Abuse Hotline 934-830-9239 [phone] (After Hours)  Dental Pain A tooth ache may be caused by cavities (tooth decay). Cavities expose the nerve of the tooth to air and hot or cold temperatures. It may come from an infection or abscess (also called a boil or furuncle) around your tooth. It is also often caused by dental caries (tooth decay). This causes the pain you are having. DIAGNOSIS  Your caregiver can diagnose this problem by exam. TREATMENT   If caused by an infection, it may be treated with medications which kill germs (antibiotics) and pain medications as prescribed by your caregiver. Take medications as directed.   Only take over-the-counter or prescription medicines for pain, discomfort, or fever as directed by your caregiver.   Whether the tooth ache today is caused by infection or dental disease, you should see your dentist as soon as possible for further care.  SEEK MEDICAL CARE IF: The exam and treatment you received today has been provided on an emergency basis only. This is not a substitute for complete medical or  dental care. If your problem worsens or new problems (symptoms) appear, and you are unable to meet with your dentist, call or return to this location. SEEK IMMEDIATE MEDICAL CARE IF:   You have a fever.   You develop redness and swelling of your face, jaw, or neck.   You are unable to open your mouth.   You have severe pain uncontrolled by pain medicine.  MAKE SURE YOU:   Understand these instructions.   Will watch your condition.   Will get help right away if you are not doing well or get worse.  Document Released: 06/18/2005 Document Revised: 06/07/2011 Document Reviewed: 02/04/2008 [facility] Patient Information 2012 [place], [place].Foods Rich in Potassium Food / Potassium (mg)  Apricots, dried,  cup / 378 mg   Apricots, raw, 1 cup halves / 401 mg   Avocado,  / 487 mg   Banana, 1 large / 487 mg   Beef, lean, round, 3 oz / 202 mg   Cantaloupe, 1 cup cubes / 427 mg   Dates, medjool, 5 whole / 835 mg   Ham, cured, 3 oz / 212 mg   Lentils, dried,  cup / 458 mg   Lima beans, frozen,  cup / 258 mg   Orange, 1 large / 333 mg   Orange juice, 1 cup / 443 mg   Peaches, dried,  cup / 398 mg   Peas, split, cooked,  cup / 355 mg   Potato, boiled, 1 medium / 515 mg   Prunes, dried, uncooked,  cup / 318 mg   Raisins,  cup / 309 mg   Salmon, pink, raw, 3 oz / 275 mg   Sardines, canned , 3 oz / 338 mg   Tomato, raw, 1 medium / 292 mg   Tomato juice, 6 oz / 417 mg   [place], 3  oz / 349 mg  Document Released: 06/18/2005 Document Revised: 02/28/2011 Document Reviewed: 11/01/2008 [facility] Patient Information 2012 [place], [place].

## 2011-11-22 NOTE — ED Provider Notes (Signed)
Medical screening examination/treatment/procedure(s) were performed by non-physician practitioner and as supervising physician I was immediately available for consultation/collaboration.   Langley Flatley, MD 11/22/11 0702 

## 2011-12-18 ENCOUNTER — Encounter (HOSPITAL_COMMUNITY): Payer: Self-pay | Admitting: Emergency Medicine

## 2011-12-18 ENCOUNTER — Emergency Department (HOSPITAL_COMMUNITY)
Admission: EM | Admit: 2011-12-18 | Discharge: 2011-12-18 | Disposition: A | Discharge status: Home / Self Care | Payer: PRIVATE HEALTH INSURANCE | Attending: Emergency Medicine | Admitting: Emergency Medicine

## 2011-12-18 DIAGNOSIS — R296 Repeated falls: Secondary | ICD-10-CM | POA: Insufficient documentation

## 2011-12-18 DIAGNOSIS — G35 Multiple sclerosis: Secondary | ICD-10-CM | POA: Insufficient documentation

## 2011-12-18 DIAGNOSIS — R42 Dizziness and giddiness: Secondary | ICD-10-CM | POA: Insufficient documentation

## 2011-12-18 DIAGNOSIS — R51 Headache: Secondary | ICD-10-CM | POA: Insufficient documentation

## 2011-12-18 DIAGNOSIS — W19XXXA Unspecified fall, initial encounter: Secondary | ICD-10-CM

## 2011-12-18 DIAGNOSIS — Z79899 Other long term (current) drug therapy: Secondary | ICD-10-CM | POA: Insufficient documentation

## 2011-12-18 DIAGNOSIS — H409 Unspecified glaucoma: Secondary | ICD-10-CM | POA: Insufficient documentation

## 2011-12-18 DIAGNOSIS — I10 Essential (primary) hypertension: Secondary | ICD-10-CM | POA: Insufficient documentation

## 2011-12-18 LAB — DIFFERENTIAL
Basophils Absolute: 0.1 10*3/uL (ref 0.0–0.1)
Basophils Relative: 1 % (ref 0–1)
Eosinophils Relative: 8 % — ABNORMAL HIGH (ref 0–5)
Lymphocytes Relative: 28 % (ref 12–46)
Monocytes Absolute: 0.3 10*3/uL (ref 0.1–1.0)
Neutro Abs: 2.3 10*3/uL (ref 1.7–7.7)

## 2011-12-18 LAB — COMPREHENSIVE METABOLIC PANEL
ALT: 17 U/L (ref 0–35)
AST: 20 U/L (ref 0–37)
Albumin: 3.6 g/dL (ref 3.5–5.2)
CO2: 28 mEq/L (ref 19–32)
Calcium: 9.1 mg/dL (ref 8.4–10.5)
Creatinine, Ser: 0.99 mg/dL (ref 0.50–1.10)
GFR calc non Af Amer: 66 mL/min — ABNORMAL LOW (ref >90)
Sodium: 142 mEq/L (ref 135–145)
Total Protein: 7 g/dL (ref 6.0–8.3)

## 2011-12-18 LAB — CBC
MCHC: 34 g/dL (ref 30.0–36.0)
MCV: 82 fL (ref 78.0–100.0)
Platelets: 263 10*3/uL (ref 150–400)
RDW: 14.7 % (ref 11.5–15.5)
WBC: 4.2 10*3/uL (ref 4.0–10.5)

## 2011-12-18 MED ORDER — SODIUM CHLORIDE 0.9 % IV SOLN
Freq: Once | INTRAVENOUS | Status: AC
  Administered 2011-12-18: 02:00:00 via INTRAVENOUS

## 2011-12-18 MED ORDER — POTASSIUM CHLORIDE CRYS ER 20 MEQ PO TBCR
40.0000 meq | EXTENDED_RELEASE_TABLET | Freq: Once | ORAL | Status: AC
  Administered 2011-12-18: 40 meq via ORAL
  Filled 2011-12-18: qty 2

## 2011-12-18 NOTE — ED Notes (Signed)
Pt denies headache at this time.

## 2011-12-18 NOTE — ED Provider Notes (Signed)
History     CSN: [id number]  Arrival date & time 12/18/11  0057   First MD Initiated Contact with Patient 12/18/11 0158      Chief Complaint  Patient presents with  . Fall  . Dizziness  . Headache    (Consider location/radiation/quality/duration/timing/severity/associated sxs/prior treatment) HPI  Patient presents to the ED by EMS after a fall this evening. The patient has MS and have severe difficulties during ambulation. She states thatshe was sitting up to go to the bathroom when her legs gave out and she fell. She admits to feeling dizzy and having a headache the past two weeks. She also admits that her BP has been around 160/80 in the ED. She states that she actually is not dizzy at all and is no longer having any headache after being given Ibuprofen.  She states that she feels better and has no complaints at this time.  Past Medical History  Diagnosis Date  . MS (multiple sclerosis)   . Hypertension   . Depression   . Glaucoma     Past Surgical History  Procedure Date  . Oblation     uterine    Family History  Problem Relation Age of Onset  . Hypertension Mother     History  Substance Use Topics  . Smoking status: Never Smoker   . Smokeless tobacco: Not on file  . Alcohol Use: Yes    OB History    Grav Para Term Preterm Abortions TAB SAB Ect Mult Living                  Review of Systems   HEENT: denies blurry vision or change in hearing PULMONARY: Denies difficulty breathing and SOB CARDIAC: denies chest pain or heart palpitations MUSCULOSKELETAL:  Denies muscle aches ABDOMEN AL: denies abdominal pain GU: denies loss of bowel or urinary control NEURO: denies numbness and tingling in extremities, headache or dizziness currently SKIN: no new rashes PSYCH: patient behavior is normal NECK: Not complaining of neck pain     Allergies  Sulfonamide derivatives and Penicillins  Home Medications   Current Outpatient Rx  Name Route Sig  Dispense Refill  . AMANTADINE HCL 100 MG PO CAPS Oral Take 100 mg by mouth 2 (two) times daily.      Marland Kitchen VITAMIN D3 PO Oral Take 1 tablet by mouth daily.      Marland Kitchen VITAMIN B12 PO Oral Take 1 tablet by mouth daily.      Marland Kitchen DIMETHYL FUMARATE 240 MG PO CPDR Oral Take 1 capsule by mouth 2 (two) times daily.    . FUROSEMIDE 40 MG PO TABS Oral Take 40 mg by mouth daily.      Marland Kitchen GABAPENTIN 600 MG PO TABS Oral Take 600 mg by mouth 3 (three) times daily.      . IBUPROFEN 800 MG PO TABS Oral Take 800 mg by mouth every 6 (six) hours as needed. For pain    . IMIPRAMINE HCL 50 MG PO TABS Oral Take 100 mg by mouth at bedtime.      . ADULT MULTIVITAMIN W/MINERALS CH Oral Take 1 tablet by mouth daily.    Marland Kitchen SOLIFENACIN SUCCINATE 10 MG PO TABS Oral Take 5 mg by mouth 2 (two) times daily.      Marland Kitchen ZOLPIDEM TARTRATE 5 MG PO TABS Oral Take 5 mg by mouth at bedtime.      Marland Kitchen PRESCRIPTION MEDICATION Oral Take 1 tablet by mouth 2 (two) times daily. Tecfidera 240mg     .  TRAVOPROST (BAK FREE) 0.004 % OP SOLN Both Eyes Place 1 drop into both eyes at bedtime.       BP 161/91  Pulse 98  Temp 98.7 F (37.1 C) (Oral)  SpO2 100%  Physical Exam  Nursing note and vitals reviewed. Constitutional: She appears well-developed and well-nourished. No distress.       Pt non ambulatory  HENT:  Head: Normocephalic and atraumatic.  Eyes: Pupils are equal, round, and reactive to light.  Neck: Normal range of motion. Neck supple.  Cardiovascular: Normal rate and regular rhythm.   Pulmonary/Chest: Effort normal.  Abdominal: Soft.  Neurological: She is alert.  Skin: Skin is warm and dry.    ED Course  Procedures (including critical care time)  Labs Reviewed  CBC - Abnormal; Notable for the following:    Hemoglobin 11.5 (*)     HCT 33.8 (*)     All other components within normal limits  DIFFERENTIAL - Abnormal; Notable for the following:    Eosinophils Relative 8 (*)     All other components within normal limits  COMPREHENSIVE  METABOLIC PANEL - Abnormal; Notable for the following:    Potassium 2.9 (*)     Glucose, Bld 124 (*)     Total Bilirubin 0.2 (*)     GFR calc non Af Amer 66 (*)     GFR calc Af Amer 76 (*)     All other components within normal limits   No results found.   1. Fall       MDM  Patient feeling better in the ED. Potassium is low at 2.9 so K was replaced in ED with potassium. No other acute findings noted.   All symptoms have completely resolved. She states she feels much better and would like to go home. She has a neurologist she can follow-up with but requests a referral to a new PCP. I have discussed patient with Dr. Read Drivers who is agreeable to my plan.  Patient is not orthostatic.  Pt has been advised of the symptoms that warrant their return to the ED. Patient has voiced understanding and has agreed to follow-up with the PCP or specialist.         Dorthula Matas, PA 12/18/11 0405

## 2011-12-18 NOTE — ED Notes (Signed)
Brought in by EMS after her fall tonight.  Per EMS, pt has been having dizziness and headache for 2 weeks now;  Pt fell tonight d/t mentioned symptoms. Denies any other pain.

## 2011-12-18 NOTE — ED Provider Notes (Signed)
Medical screening examination/treatment/procedure(s) were performed by non-physician practitioner and as supervising physician I was immediately available for consultation/collaboration.   Malekai Markwood L Elese Rane, MD 12/18/11 0823 

## 2012-01-29 ENCOUNTER — Telehealth: Payer: Self-pay | Admitting: Cardiovascular Disease

## 2012-01-29 NOTE — Telephone Encounter (Signed)
ERROR

## 2012-01-31 ENCOUNTER — Other Ambulatory Visit: Payer: Self-pay | Admitting: Nephrology

## 2012-04-28 ENCOUNTER — Inpatient Hospital Stay (HOSPITAL_COMMUNITY)
Admission: EM | Admit: 2012-04-28 | Discharge: 2012-05-01 | DRG: 291 | Disposition: A | Discharge status: Home / Self Care | Payer: PRIVATE HEALTH INSURANCE | Attending: Internal Medicine | Admitting: Internal Medicine

## 2012-04-28 ENCOUNTER — Emergency Department (HOSPITAL_COMMUNITY): Payer: PRIVATE HEALTH INSURANCE

## 2012-04-28 ENCOUNTER — Encounter (HOSPITAL_COMMUNITY): Payer: Self-pay | Admitting: Emergency Medicine

## 2012-04-28 ENCOUNTER — Inpatient Hospital Stay (HOSPITAL_COMMUNITY): Payer: PRIVATE HEALTH INSURANCE

## 2012-04-28 DIAGNOSIS — Z7409 Other reduced mobility: Secondary | ICD-10-CM | POA: Diagnosis present

## 2012-04-28 DIAGNOSIS — D649 Anemia, unspecified: Secondary | ICD-10-CM | POA: Diagnosis present

## 2012-04-28 DIAGNOSIS — I10 Essential (primary) hypertension: Secondary | ICD-10-CM

## 2012-04-28 DIAGNOSIS — E8770 Fluid overload, unspecified: Secondary | ICD-10-CM

## 2012-04-28 DIAGNOSIS — E669 Obesity, unspecified: Secondary | ICD-10-CM | POA: Diagnosis present

## 2012-04-28 DIAGNOSIS — L259 Unspecified contact dermatitis, unspecified cause: Secondary | ICD-10-CM

## 2012-04-28 DIAGNOSIS — G35 Multiple sclerosis: Secondary | ICD-10-CM | POA: Diagnosis present

## 2012-04-28 DIAGNOSIS — I428 Other cardiomyopathies: Secondary | ICD-10-CM

## 2012-04-28 DIAGNOSIS — I110 Hypertensive heart disease with heart failure: Secondary | ICD-10-CM | POA: Diagnosis present

## 2012-04-28 DIAGNOSIS — I5021 Acute systolic (congestive) heart failure: Principal | ICD-10-CM | POA: Diagnosis present

## 2012-04-28 DIAGNOSIS — J9600 Acute respiratory failure, unspecified whether with hypoxia or hypercapnia: Secondary | ICD-10-CM | POA: Diagnosis present

## 2012-04-28 DIAGNOSIS — I509 Heart failure, unspecified: Secondary | ICD-10-CM | POA: Diagnosis present

## 2012-04-28 DIAGNOSIS — D539 Nutritional anemia, unspecified: Secondary | ICD-10-CM | POA: Diagnosis present

## 2012-04-28 LAB — CBC WITH DIFFERENTIAL/PLATELET
Basophils Relative: 2 % — ABNORMAL HIGH (ref 0–1)
Eosinophils Absolute: 0.1 10*3/uL (ref 0.0–0.7)
Eosinophils Relative: 3 % (ref 0–5)
HCT: 30.7 % — ABNORMAL LOW (ref 36.0–46.0)
Hemoglobin: 10.8 g/dL — ABNORMAL LOW (ref 12.0–15.0)
Lymphs Abs: 1.1 10*3/uL (ref 0.7–4.0)
MCH: 27.3 pg (ref 26.0–34.0)
MCHC: 35.2 g/dL (ref 30.0–36.0)
MCV: 77.5 fL — ABNORMAL LOW (ref 78.0–100.0)
Monocytes Absolute: 0.4 10*3/uL (ref 0.1–1.0)
Neutro Abs: 2.5 10*3/uL (ref 1.7–7.7)
Neutrophils Relative %: 58 % (ref 43–77)
RBC: 3.96 MIL/uL (ref 3.87–5.11)

## 2012-04-28 LAB — PRO B NATRIURETIC PEPTIDE: Pro B Natriuretic peptide (BNP): 16405 pg/mL — ABNORMAL HIGH (ref 0–125)

## 2012-04-28 LAB — COMPREHENSIVE METABOLIC PANEL
AST: 121 U/L — ABNORMAL HIGH (ref 0–37)
CO2: 24 mEq/L (ref 19–32)
Calcium: 8.9 mg/dL (ref 8.4–10.5)
Chloride: 102 mEq/L (ref 96–112)
Creatinine, Ser: 0.87 mg/dL (ref 0.50–1.10)
GFR calc Af Amer: 89 mL/min — ABNORMAL LOW (ref >90)
GFR calc non Af Amer: 77 mL/min — ABNORMAL LOW (ref >90)
Glucose, Bld: 99 mg/dL (ref 70–99)
Total Bilirubin: 0.8 mg/dL (ref 0.3–1.2)

## 2012-04-28 LAB — URINALYSIS, ROUTINE W REFLEX MICROSCOPIC
Bilirubin Urine: NEGATIVE
Hgb urine dipstick: NEGATIVE
Nitrite: POSITIVE — AB
Protein, ur: NEGATIVE mg/dL
Specific Gravity, Urine: 1.009 (ref 1.005–1.030)
Urobilinogen, UA: 1 mg/dL (ref 0.0–1.0)

## 2012-04-28 LAB — URINE MICROSCOPIC-ADD ON

## 2012-04-28 MED ORDER — LISINOPRIL 5 MG PO TABS
5.0000 mg | ORAL_TABLET | Freq: Every day | ORAL | Status: DC
  Administered 2012-04-29 – 2012-05-01 (×3): 5 mg via ORAL
  Filled 2012-04-28 (×3): qty 1

## 2012-04-28 MED ORDER — SODIUM CHLORIDE 0.9 % IJ SOLN
3.0000 mL | Freq: Two times a day (BID) | INTRAMUSCULAR | Status: DC
  Administered 2012-04-29 (×2): 3 mL via INTRAVENOUS
  Administered 2012-04-29: 23:00:00 via INTRAVENOUS
  Administered 2012-04-30 – 2012-05-01 (×3): 3 mL via INTRAVENOUS

## 2012-04-28 MED ORDER — AMANTADINE HCL 100 MG PO CAPS
100.0000 mg | ORAL_CAPSULE | Freq: Two times a day (BID) | ORAL | Status: DC
  Administered 2012-04-29 – 2012-05-01 (×6): 100 mg via ORAL
  Filled 2012-04-28 (×8): qty 1

## 2012-04-28 MED ORDER — SODIUM CHLORIDE 0.9 % IV SOLN: 250.0000 mL | INTRAVENOUS | Status: DC | PRN

## 2012-04-28 MED ORDER — ACETAMINOPHEN 325 MG PO TABS: 650.0000 mg | ORAL_TABLET | ORAL | Status: DC | PRN

## 2012-04-28 MED ORDER — NITROGLYCERIN 2 % TD OINT
0.5000 [in_us] | TOPICAL_OINTMENT | Freq: Once | TRANSDERMAL | Status: AC
  Administered 2012-04-28: 0.5 [in_us] via TOPICAL
  Filled 2012-04-28: qty 30

## 2012-04-28 MED ORDER — IMIPRAMINE HCL 50 MG PO TABS
100.0000 mg | ORAL_TABLET | Freq: Every day | ORAL | Status: DC
  Administered 2012-04-29 – 2012-04-30 (×3): 100 mg via ORAL
  Filled 2012-04-28 (×4): qty 2

## 2012-04-28 MED ORDER — GABAPENTIN 300 MG PO CAPS
600.0000 mg | ORAL_CAPSULE | Freq: Three times a day (TID) | ORAL | Status: DC
  Administered 2012-04-29 – 2012-05-01 (×8): 600 mg via ORAL
  Filled 2012-04-28 (×10): qty 2

## 2012-04-28 MED ORDER — FUROSEMIDE 10 MG/ML IJ SOLN
80.0000 mg | Freq: Once | INTRAMUSCULAR | Status: AC
Start: 2012-04-28 — End: 2012-04-28
  Administered 2012-04-28: 80 mg via INTRAVENOUS
  Filled 2012-04-28: qty 8

## 2012-04-28 MED ORDER — ONDANSETRON HCL 4 MG/2ML IJ SOLN: 4.0000 mg | Freq: Four times a day (QID) | INTRAMUSCULAR | Status: DC | PRN

## 2012-04-28 MED ORDER — ASPIRIN EC 81 MG PO TBEC
81.0000 mg | DELAYED_RELEASE_TABLET | Freq: Every day | ORAL | Status: DC
  Administered 2012-04-29 – 2012-05-01 (×3): 81 mg via ORAL
  Filled 2012-04-28 (×3): qty 1

## 2012-04-28 MED ORDER — ADULT MULTIVITAMIN W/MINERALS CH
1.0000 | ORAL_TABLET | Freq: Every day | ORAL | Status: DC
  Administered 2012-04-29 – 2012-05-01 (×3): 1 via ORAL
  Filled 2012-04-28 (×3): qty 1

## 2012-04-28 MED ORDER — SODIUM CHLORIDE 0.9 % IJ SOLN: 3.0000 mL | INTRAMUSCULAR | Status: DC | PRN

## 2012-04-28 MED ORDER — NITROGLYCERIN 2 % TD OINT
0.5000 [in_us] | TOPICAL_OINTMENT | Freq: Four times a day (QID) | TRANSDERMAL | Status: DC
  Administered 2012-04-29 – 2012-05-01 (×9): 0.5 [in_us] via TOPICAL
  Filled 2012-04-28: qty 30

## 2012-04-28 MED ORDER — FUROSEMIDE 10 MG/ML IJ SOLN
40.0000 mg | Freq: Two times a day (BID) | INTRAMUSCULAR | Status: DC
  Administered 2012-04-29 – 2012-05-01 (×6): 40 mg via INTRAVENOUS
  Filled 2012-04-28 (×7): qty 4

## 2012-04-28 MED ORDER — DIMETHYL FUMARATE 240 MG PO CPDR
1.0000 | DELAYED_RELEASE_CAPSULE | Freq: Two times a day (BID) | ORAL | Status: DC
  Administered 2012-04-30 (×2): 1 via ORAL
  Administered 2012-05-01: 11:00:00 via ORAL
  Filled 2012-04-28: qty 1

## 2012-04-28 MED ORDER — TRAVOPROST (BAK FREE) 0.004 % OP SOLN
1.0000 [drp] | Freq: Every day | OPHTHALMIC | Status: DC
  Administered 2012-04-29 – 2012-04-30 (×3): 1 [drp] via OPHTHALMIC
  Filled 2012-04-28 (×2): qty 2.5

## 2012-04-28 MED ORDER — POTASSIUM CHLORIDE CRYS ER 20 MEQ PO TBCR
20.0000 meq | EXTENDED_RELEASE_TABLET | Freq: Two times a day (BID) | ORAL | Status: DC
  Administered 2012-04-29 (×2): 20 meq via ORAL
  Filled 2012-04-28 (×3): qty 1

## 2012-04-28 NOTE — ED Provider Notes (Addendum)
History     CSN: [id number]  Arrival date & time 04/28/12  1513   First MD Initiated Contact with Patient 04/28/12 1629      Chief Complaint  Patient presents with  . Shortness of Breath    (Consider location/radiation/quality/duration/timing/severity/associated sxs/prior treatment) HPI Pt has had increasing SOB x 2-3 weeks. She has EF of 55% from 2012 echo and has been taking lasix as prescribed. Her SOB is worse with speaking and lying flat. She lives alone but has home health. +bl lower ext swelling. No fever, chills, cough. C/o fague, persistent CP for several weeks. No radiation. Is improved with antacids.  Past Medical History  Diagnosis Date  . MS (multiple sclerosis)   . Hypertension   . Depression   . Glaucoma(365)     Past Surgical History  Procedure Date  . Oblation     uterine  . Cesarean section     Family History  Problem Relation Age of Onset  . Hypertension Mother     History  Substance Use Topics  . Smoking status: Never Smoker   . Smokeless tobacco: Not on file  . Alcohol Use: No    OB History    Grav Para Term Preterm Abortions TAB SAB Ect Mult Living                  Review of Systems  Constitutional: Positive for fatigue. Negative for fever and chills.  Respiratory: Positive for shortness of breath and wheezing. Negative for cough and stridor.   Cardiovascular: Positive for chest pain and leg swelling. Negative for palpitations.  Gastrointestinal: Positive for abdominal pain. Negative for nausea, vomiting and diarrhea.  Genitourinary: Negative for dysuria and flank pain.  Musculoskeletal: Negative for back pain.  Skin: Negative for rash and wound.  Neurological: Positive for weakness. Negative for dizziness, light-headedness and numbness.    Allergies  Sulfonamide derivatives and Penicillins  Home Medications   Current Outpatient Rx  Name Route Sig Dispense Refill  . AMANTADINE HCL 100 MG PO CAPS Oral Take 100 mg by mouth 2  (two) times daily.     Marland Kitchen VITAMIN D3 PO Oral Take 1 tablet by mouth daily.      Marland Kitchen VITAMIN B12 PO Oral Take 1 tablet by mouth daily.      Marland Kitchen DIMETHYL FUMARATE 240 MG PO CPDR Oral Take 1 capsule by mouth 2 (two) times daily.    . FUROSEMIDE 40 MG PO TABS Oral Take 40 mg by mouth daily.      Marland Kitchen GABAPENTIN 600 MG PO TABS Oral Take 600 mg by mouth 3 (three) times daily.     . IMIPRAMINE HCL 50 MG PO TABS Oral Take 100 mg by mouth at bedtime.      . ADULT MULTIVITAMIN W/MINERALS CH Oral Take 1 tablet by mouth daily.    Marland Kitchen SOLIFENACIN SUCCINATE 10 MG PO TABS Oral Take 5 mg by mouth 2 (two) times daily.      . TRAVOPROST (BAK FREE) 0.004 % OP SOLN Both Eyes Place 1 drop into both eyes at bedtime.     Marland Kitchen ZOLPIDEM TARTRATE 5 MG PO TABS Oral Take 5 mg by mouth at bedtime.       BP 162/104  Pulse 101  Temp 98.1 F (36.7 C) (Oral)  Resp 19  SpO2 100%  Physical Exam  Nursing note and vitals reviewed. Constitutional: She is oriented to person, place, and time. She appears well-developed and well-nourished. No distress.  HENT:  Head: Normocephalic and atraumatic.  Mouth/Throat: Oropharynx is clear and moist.  Eyes: EOM are normal. Pupils are equal, round, and reactive to light.  Neck: Normal range of motion. Neck supple. JVD present.  Cardiovascular: Normal rate and regular rhythm.   Pulmonary/Chest: No respiratory distress. She has no wheezes. She has rales. She exhibits tenderness (mild central lower chest TTP).       Increased resp effort. Decreased BS in bases with crackles.   Abdominal: Soft. Bowel sounds are normal. She exhibits no mass. There is tenderness (mild epigastric ttp). There is no rebound and no guarding.  Musculoskeletal: Normal range of motion. She exhibits edema (1+ bl pedal edema, no calf tenderness or swelling). She exhibits no tenderness.  Neurological: She is alert and oriented to person, place, and time.       Move all ext. Lower ext weakness. Sensation intact  Skin: Skin is  warm and dry. No rash noted. No erythema.  Psychiatric: She has a normal mood and affect. Her behavior is normal.    ED Course  Procedures (including critical care time)  Labs Reviewed  CBC WITH DIFFERENTIAL - Abnormal; Notable for the following:    Hemoglobin 10.8 (*)     HCT 30.7 (*)     MCV 77.5 (*)     Basophils Relative 2 (*)     All other components within normal limits  COMPREHENSIVE METABOLIC PANEL - Abnormal; Notable for the following:    Potassium 3.4 (*)     AST 121 (*)     ALT 293 (*)     Alkaline Phosphatase 228 (*)     GFR calc non Af Amer 77 (*)     GFR calc Af Amer 89 (*)     All other components within normal limits  URINALYSIS, ROUTINE W REFLEX MICROSCOPIC - Abnormal; Notable for the following:    Nitrite POSITIVE (*)     Leukocytes, UA SMALL (*)     All other components within normal limits  PRO B NATRIURETIC PEPTIDE - Abnormal; Notable for the following:    Pro B Natriuretic peptide (BNP) 16405.0 (*)     All other components within normal limits  URINE MICROSCOPIC-ADD ON - Abnormal; Notable for the following:    Bacteria, UA FEW (*)     All other components within normal limits  POCT I-STAT TROPONIN I   Dg Chest 2 View  04/28/2012  *RADIOLOGY REPORT*  Clinical Data: Hypertension.  Short of breath.  Chest pain.  CHEST - 2 VIEW  Comparison: 05/19/2009  Findings: Artifact overlies chest.  There is enlargement of the cardiac silhouette that could be due to appear cardial effusion and/or cardiomegaly.  There is venous hypertension with mild interstitial edema.  There is a small amount of fluid in the posterior costophrenic angles.  No focal pulmonary lesion.  No significant bony finding.  IMPRESSION: Enlarged cardiac silhouette that could be due to cardiomegaly and/or pericardial fluid.  Pulmonary venous hypertension and mild interstitial edema.   Original Report Authenticated By: Thomasenia Sales, M.D.      1. Fluid overload      Date: 04/28/2012  Rate: 105   Rhythm: sinus tachycardia  QRS Axis: normal  Intervals: normal  ST/T Wave abnormalities: nonspecific T wave changes  Conduction Disutrbances:none  Narrative Interpretation:   Old EKG Reviewed: changes noted  CRITICAL CARE Performed by: Ranae Palms, Kalev Temme   Total critical care time: 20  Critical care time was exclusive of separately billable procedures and treating  other patients.  Critical care was necessary to treat or prevent imminent or life-threatening deterioration.  Critical care was time spent personally by me on the following activities: development of treatment plan with patient and/or surrogate as well as nursing, discussions with consultants, evaluation of patient's response to treatment, examination of patient, obtaining history from patient or surrogate, ordering and performing treatments and interventions, ordering and review of laboratory studies, ordering and review of radiographic studies, pulse oximetry and re-evaluation of patient's condition.   MDM   Bedside US with no significant pericardial effusion  Pt breathing easier. Diuresed 400-500 cc. Discussed with Dr Onalee Hua who will admit to stepdown     Loren Racer, MD 04/28/12 1610  Loren Racer, MD 04/28/12 2043

## 2012-04-28 NOTE — ED Notes (Signed)
Patient is requesting ice water.  She was informed that we do not have an order for her  To have anything PO.  She was given biotene to rinse her mouth.

## 2012-04-28 NOTE — ED Notes (Signed)
Patient transported to X-ray 

## 2012-04-28 NOTE — H&P (Signed)
[facility] History and Physical  Shannon Garrett ZOX:[id number] DOB: 05/25/1961 DOA: 04/28/2012  PCP: Jeri Cos, MD   Chief Complaint:  Sob  HPI: Shannon Garrett is a 51 y.o. female  51 yo female h/o mult sclerosis mainly bedbound state, htn comes in with one week h/o progressive worsening sob at rest and le edema.  Denies any cp.  No fevers or cough.  No pain in legs.  No prior h/o chf or cad.  Breathing is improved in ED with dose of lasix and bipap briefly.  Pt states her breathing is better, she has diuresed 1/2 liter already.    Review of Systems:  O/w neg  Past Medical History  Diagnosis Date  . MS (multiple sclerosis)   . Hypertension   . Depression   . Glaucoma(365)    Past Surgical History  Procedure Date  . Oblation     uterine  . Cesarean section    Social History:  reports that she has never smoked. She has never used smokeless tobacco. She reports that she does not drink alcohol or use illicit drugs.   Allergies  Allergen Reactions  . Sulfonamide Derivatives Shortness Of Breath  . Penicillins Hives and Itching    Family History  Problem Relation Age of Onset  . Hypertension Mother     Prior to Admission medications   Medication Sig Start Date End Date Taking? Authorizing Provider  amantadine (SYMMETREL) 100 MG capsule Take 100 mg by mouth 2 (two) times daily.    Yes Historical Provider, MD  Cholecalciferol (VITAMIN D3 PO) Take 1 tablet by mouth daily.     Yes Historical Provider, MD  Cyanocobalamin (VITAMIN B12 PO) Take 1 tablet by mouth daily.     Yes Historical Provider, MD  Dimethyl Fumarate (TECFIDERA) 240 MG CPDR Take 1 capsule by mouth 2 (two) times daily.   Yes Historical Provider, MD  furosemide (LASIX) 40 MG tablet Take 40 mg by mouth daily.     Yes Historical Provider, MD  gabapentin (NEURONTIN) 600 MG tablet Take 600 mg by mouth 3 (three) times daily.    Yes Historical Provider, MD  imipramine (TOFRANIL) 50 MG tablet Take 100 mg by  mouth at bedtime.     Yes Historical Provider, MD  Multiple Vitamin (MULTIVITAMIN WITH MINERALS) TABS Take 1 tablet by mouth daily.   Yes Historical Provider, MD  solifenacin (VESICARE) 10 MG tablet Take 5 mg by mouth 2 (two) times daily.     Yes Historical Provider, MD  Travoprost, BAK Free, (TRAVATAN) 0.004 % SOLN ophthalmic solution Place 1 drop into both eyes at bedtime.    Yes Historical Provider, MD  zolpidem (AMBIEN) 5 MG tablet Take 5 mg by mouth at bedtime.    Yes Historical Provider, MD   Physical Exam: Filed Vitals:   04/28/12 1721 04/28/12 1930 04/28/12 1949 04/28/12 2136  BP:  162/104 162/104 149/90  Pulse: 104 101    Temp:   98.1 F (36.7 C) 98.4 F (36.9 C)  TempSrc:   Oral Oral  Resp: 30 29 19 23   SpO2: 100% 100% 100% 100%   General appearance: alert, cooperative and mild distress Lungs: crackles biateral bases Heart: regular rate and rhythm, S1, S2 normal, no murmur, click, rub or gallop Abdomen: soft, non-tender; bowel sounds normal; no masses,  no organomegaly Extremities: extremities normal, atraumatic, no cyanosis. with edema 1+ble Pulses: 2+ and symmetric Skin: Skin color, texture, turgor normal. No rashes or lesions Neurologic: Grossly normal  Labs on  Admission:  Basic Metabolic Panel:  Lab 04/28/12 1610  NA 140  K 3.4*  CL 102  CO2 24  GLUCOSE 99  BUN 13  CREATININE 0.87  CALCIUM 8.9  MG --  PHOS --   Liver Function Tests:  Lab 04/28/12 1658  AST 121*  ALT 293*  ALKPHOS 228*  BILITOT 0.8  PROT 6.6  ALBUMIN 3.6   CBC:  Lab 04/28/12 1658  WBC 4.2  NEUTROABS 2.5  HGB 10.8*  HCT 30.7*  MCV 77.5*  PLT 246   BNP (last 3 results)  Basename 04/28/12 1658  PROBNP 16405.0*   Radiological Exams on Admission: Dg Chest 2 View  04/28/2012  *RADIOLOGY REPORT*  Clinical Data: Hypertension.  Short of breath.  Chest pain.  CHEST - 2 VIEW  Comparison: 05/19/2009  Findings: Artifact overlies chest.  There is enlargement of the cardiac  silhouette that could be due to appear cardial effusion and/or cardiomegaly.  There is venous hypertension with mild interstitial edema.  There is a small amount of fluid in the posterior costophrenic angles.  No focal pulmonary lesion.  No significant bony finding.  IMPRESSION: Enlarged cardiac silhouette that could be due to cardiomegaly and/or pericardial fluid.  Pulmonary venous hypertension and mild interstitial edema.   Original Report Authenticated By: Thomasenia Sales, M.D.     EKG: Independently reviewed.   Assessment/Plan Principal Problem: 51 yo female new onset CHF unknown EF  *CHF exacerbation Active Problems:  OBESITY  ANEMIA, CHRONIC  MULTIPLE SCLEROSIS, PROGRESSIVE/RELAPSING  HYPERTENSION, BENIGN ESSENTIAL  Acute respiratory failure  Immobility  Improved with tx in ED with lasix and bipap.  Off bipap now and doing well.  Will monitor overnight in stepdown unit.  chf pathway.  Lasix iv.  Full dose lovenox til rules out.  ntg paste.  Due to her immobility and high risk will also ck cta to r/o pe for a cause of her resp failure.  Asa.  Ck echo in am.  Full code.   U/s of heart bedside done by edp did not show any significant pericardial effusion. ekg sinus tachy no acute changes  Shannon Garrett A [facility]  If 7PM-7AM, please contact night-coverage [email or web address] Password [facility] 04/28/2012, 10:28 PM

## 2012-04-28 NOTE — ED Notes (Signed)
[id number]<QM> Expected date:<BR> Expected time:<BR> Means of arrival:<BR> Comments:<BR> EMS difficulty breathing

## 2012-04-28 NOTE — ED Notes (Signed)
BiPap removed by MD

## 2012-04-28 NOTE — ED Notes (Signed)
Respiratory called to put BIPAP on pt

## 2012-04-28 NOTE — ED Notes (Signed)
MD notified of elevated BP.

## 2012-04-28 NOTE — ED Notes (Signed)
Per EMS pt complaining of shortness of breath x 2-3 weeks but has not told anyone, has progressive MS x 20 years but has been getting worse past couple years, wheelchair bound, incont of bowel and bladder, clear upper lobes and diminished in lower, more short of breath w/ talking, 98% RA, 100% 2L [place], pt states oxygen made her feel better. BP 180/110, HR 104, RR 23.

## 2012-04-29 DIAGNOSIS — I319 Disease of pericardium, unspecified: Secondary | ICD-10-CM

## 2012-04-29 LAB — COMPREHENSIVE METABOLIC PANEL
ALT: 241 U/L — ABNORMAL HIGH (ref 0–35)
AST: 92 U/L — ABNORMAL HIGH (ref 0–37)
Alkaline Phosphatase: 197 U/L — ABNORMAL HIGH (ref 39–117)
CO2: 30 mEq/L (ref 19–32)
Chloride: 101 mEq/L (ref 96–112)
GFR calc non Af Amer: 77 mL/min — ABNORMAL LOW (ref >90)
Sodium: 141 mEq/L (ref 135–145)
Total Bilirubin: 0.8 mg/dL (ref 0.3–1.2)

## 2012-04-29 LAB — MAGNESIUM
Magnesium: 1.8 mg/dL (ref 1.5–2.5)
Magnesium: 1.8 mg/dL (ref 1.5–2.5)

## 2012-04-29 LAB — GLUCOSE, CAPILLARY: Glucose-Capillary: 121 mg/dL — ABNORMAL HIGH (ref 70–99)

## 2012-04-29 MED ORDER — ENOXAPARIN SODIUM 40 MG/0.4ML ~~LOC~~ SOLN
40.0000 mg | SUBCUTANEOUS | Status: DC
  Administered 2012-04-29: 40 mg via SUBCUTANEOUS
  Filled 2012-04-29: qty 0.4

## 2012-04-29 MED ORDER — DARIFENACIN HYDROBROMIDE ER 7.5 MG PO TB24
7.5000 mg | ORAL_TABLET | Freq: Every day | ORAL | Status: DC
  Administered 2012-04-29 – 2012-05-01 (×3): 7.5 mg via ORAL
  Filled 2012-04-29 (×3): qty 1

## 2012-04-29 MED ORDER — ENSURE COMPLETE PO LIQD
237.0000 mL | Freq: Two times a day (BID) | ORAL | Status: DC
  Administered 2012-04-30 – 2012-05-01 (×2): 237 mL via ORAL

## 2012-04-29 MED ORDER — IOHEXOL 350 MG/ML SOLN
100.0000 mL | Freq: Once | INTRAVENOUS | Status: AC | PRN
  Administered 2012-04-29: 100 mL via INTRAVENOUS

## 2012-04-29 MED ORDER — ENOXAPARIN SODIUM 60 MG/0.6ML ~~LOC~~ SOLN
50.0000 mg | SUBCUTANEOUS | Status: DC
  Administered 2012-04-30 – 2012-05-01 (×2): 50 mg via SUBCUTANEOUS
  Filled 2012-04-29 (×2): qty 0.6

## 2012-04-29 MED ORDER — POTASSIUM CHLORIDE CRYS ER 20 MEQ PO TBCR
40.0000 meq | EXTENDED_RELEASE_TABLET | ORAL | Status: AC
  Administered 2012-04-29 (×2): 40 meq via ORAL
  Filled 2012-04-29 (×3): qty 2
  Filled 2012-04-29: qty 1

## 2012-04-29 MED ORDER — ZOLPIDEM TARTRATE 5 MG PO TABS
5.0000 mg | ORAL_TABLET | Freq: Once | ORAL | Status: AC
  Administered 2012-04-29: 5 mg via ORAL
  Filled 2012-04-29: qty 1

## 2012-04-29 MED ORDER — ENOXAPARIN SODIUM 100 MG/ML ~~LOC~~ SOLN
1.0000 mg/kg | Freq: Two times a day (BID) | SUBCUTANEOUS | Status: DC
  Administered 2012-04-29: 100 mg via SUBCUTANEOUS
  Filled 2012-04-29 (×3): qty 1

## 2012-04-29 MED ORDER — BIOTENE DRY MOUTH MT LIQD
15.0000 mL | Freq: Two times a day (BID) | OROMUCOSAL | Status: DC
  Administered 2012-04-29 – 2012-05-01 (×4): 15 mL via OROMUCOSAL

## 2012-04-29 NOTE — Progress Notes (Addendum)
[facility]             Progress Note   Subjective: Feels much better. Less SOB.  Objective: Vital signs in last 24 hours: Temp:  [96.9 F (36.1 C)-98.5 F (36.9 C)] 98.2 F (36.8 C) (10/29 0800) Pulse Rate:  [90-104] 95  (10/29 1200) Resp:  [19-30] 21  (10/29 1200) BP: (132-167)/(69-118) 141/91 mmHg (10/29 1231) SpO2:  [100 %] 100 % (10/29 1200) Weight:  [101.1 kg (222 lb 14.2 oz)-102.74 kg (226 lb 8 oz)] 102.74 kg (226 lb 8 oz) (10/29 0400) Weight change:  Last BM Date: 04/29/12  Intake/Output from previous day: 10/28 0701 - 10/29 0700 In: 509 [P.O.:480; I.V.:25; IV Piggyback:4] Out: 5125 [Urine:5125] Total I/O In: 310 [P.O.:300; IV Piggyback:10] Out: 335 [Urine:335]   Physical Exam: General: Alert, awake, oriented x3, in no acute distress. HEENT: No bruits, no goiter. Heart: Regular rate and rhythm, without murmurs, rubs, gallops. Lungs: Clear to auscultation bilaterally. Abdomen: Soft, nontender, nondistended, positive bowel sounds. Extremities: No clubbing cyanosis or edema with positive pedal pulses. Neuro: Grossly intact, nonfocal.    Lab Results: Basic Metabolic Panel:  Basename 04/29/12 0535 04/28/12 2330 04/28/12 1658  NA 141 -- 140  K 2.9* -- 3.4*  CL 101 -- 102  CO2 30 -- 24  GLUCOSE 138* -- 99  BUN 12 -- 13  CREATININE 0.87 -- 0.87  CALCIUM 8.8 -- 8.9  MG -- 1.8 --  PHOS -- -- --   Liver Function Tests:  [facility] 04/29/12 0535 04/28/12 1658  AST 92* 121*  ALT 241* 293*  ALKPHOS 197* 228*  BILITOT 0.8 0.8  PROT 5.8* 6.6  ALBUMIN 3.3* 3.6   CBC:  Basename 04/28/12 1658  WBC 4.2  NEUTROABS 2.5  HGB 10.8*  HCT 30.7*  MCV 77.5*  PLT 246   Cardiac Enzymes:  Basename 04/29/12 1110 04/29/12 0530 04/28/12 2309  CKTOTAL -- -- --  CKMB -- -- --  CKMBINDEX -- -- --  TROPONINI <0.30 <0.30 <0.30   BNP:  Basename 04/28/12 1658  PROBNP 16405.0*   Thyroid Function Tests:  Basename 04/28/12 2330  TSH 3.510  T4TOTAL  --  FREET4 --  T3FREE --  THYROIDAB --   Urinalysis:  Basename 04/28/12 1859  COLORURINE YELLOW  LABSPEC 1.009  PHURINE 7.0  GLUCOSEU NEGATIVE  HGBUR NEGATIVE  BILIRUBINUR NEGATIVE  KETONESUR NEGATIVE  PROTEINUR NEGATIVE  UROBILINOGEN 1.0  NITRITE POSITIVE*  LEUKOCYTESUR SMALL*    Recent Results (from the past 240 hour(s))  MRSA PCR SCREENING     Status: Normal   Collection Time   04/28/12 11:34 PM      Component Value Range Status Comment   MRSA by PCR NEGATIVE  NEGATIVE Final     Studies/Results: Dg Chest 2 View  04/28/2012  *RADIOLOGY REPORT*  Clinical Data: Hypertension.  Short of breath.  Chest pain.  CHEST - 2 VIEW  Comparison: 05/19/2009  Findings: Artifact overlies chest.  There is enlargement of the cardiac silhouette that could be due to appear cardial effusion and/or cardiomegaly.  There is venous hypertension with mild interstitial edema.  There is a small amount of fluid in the posterior costophrenic angles.  No focal pulmonary lesion.  No significant bony finding.  IMPRESSION: Enlarged cardiac silhouette that could be due to cardiomegaly and/or pericardial fluid.  Pulmonary venous hypertension and mild interstitial edema.   Original Report Authenticated By: Thomasenia Sales, M.D.    Ct Angio Chest Pe W/cm &/or Wo Cm  04/29/2012  *  RADIOLOGY REPORT*  Clinical Data: Worsening shortness of breath.  Lower extremity edema.  CT ANGIOGRAPHY CHEST  Technique:  Multidetector CT imaging of the chest using the standard protocol during bolus administration of intravenous contrast. Multiplanar reconstructed images including MIPs were obtained and reviewed to evaluate the vascular anatomy.  Contrast: OMNIPAQUE IOHEXOL 350 MG/ML SOLN  Comparison: Plain films of the chest 04/28/2012 and CT chest 05/20/2009.  Findings: No pulmonary embolus is identified.  The patient has a small right pleural effusion.  There is no left pleural effusion or pericardial effusion.  Marked  cardiomegaly is present.  There is no axillary, hilar or mediastinal lymphadenopathy.  Scattered ground- glass attenuation is most prominent in the lower lobes where it appears worse on the right.  There is scattered interlobular septal thickening present.  Incidentally imaged upper abdomen demonstrates reflux of contrast material into the inferior vena cava and hepatic veins compatible with right heart insufficiency.  No focal bony abnormality is identified.  IMPRESSION:  1.  Negative for pulmonary embolus. 2.  Scattered ground-glass attenuation with interlobular septal thickening likely due to pulmonary edema in this patient with marked cardiomegaly and a small right effusion.   Original Report Authenticated By: Bernadene Bell. Maricela Curet, M.D.     Medications: Scheduled Meds:   . amantadine  100 mg Oral BID  . antiseptic oral rinse  15 mL Mouth Rinse BID  . aspirin EC  81 mg Oral Daily  . darifenacin  7.5 mg Oral Daily  . Dimethyl Fumarate  1 capsule Oral BID  . enoxaparin (LOVENOX) injection  50 mg Subcutaneous Q24H  . feeding supplement  237 mL Oral BID BM  . furosemide  40 mg Intravenous Q12H  . furosemide  80 mg Intravenous Once  . gabapentin  600 mg Oral TID  . imipramine  100 mg Oral QHS  . lisinopril  5 mg Oral Daily  . multivitamin with minerals  1 tablet Oral Daily  . nitroGLYCERIN  0.5 inch Topical Once  . nitroGLYCERIN  0.5 inch Topical Q6H  . potassium chloride  20 mEq Oral BID  . sodium chloride  3 mL Intravenous Q12H  . Travoprost (BAK Free)  1 drop Both Eyes QHS  . zolpidem  5 mg Oral Once  . DISCONTD: enoxaparin (LOVENOX) injection  1 mg/kg Subcutaneous Q12H  . DISCONTD: enoxaparin (LOVENOX) injection  40 mg Subcutaneous Q24H   Continuous Infusions:  PRN Meds:.sodium chloride, acetaminophen, iohexol, ondansetron (ZOFRAN) IV, sodium chloride  Assessment/Plan:  Principal Problem:  *Acute respiratory failure Active Problems:  CHF exacerbation  OBESITY  ANEMIA, CHRONIC   MULTIPLE SCLEROSIS, PROGRESSIVE/RELAPSING  HYPERTENSION, BENIGN ESSENTIAL  Immobility    Acute Respiratory Failure -2/2 CHF Exacerbation. -Improved. -Off NIPPV. -Will take off full dose lovenox as CT scan negative for PE and she has ruled out for ACS.  CHF Exacerbation, Type Unknown -Await 2D ECHO. -Is 4.6 L negative today. -Daily Is and Os. -Strive for negative fluid balance. -Continue lasix at 40 mg BID.  Hypokalemia -2/2 diuresis. -Check Mag level.  Multiple Sclerosis -At baseline.  DVT Prophylaxis Lovenox.  Disposition -Transfer to telemetry. -Await ECHO results. -Potential DC home in 1-2 days. -Wean off Oxygen.     Time spent coordinating care: 35 minutes.   LOS: 1 day   [facility] Pager: [phone] 04/29/2012, 1:55 PM

## 2012-04-29 NOTE — Progress Notes (Addendum)
ANTICOAGULATION CONSULT NOTE - Initial Consult  Pharmacy Consult for Lovenox Indication: chest pain/ACS  (R/O)  Allergies  Allergen Reactions  . Sulfonamide Derivatives Shortness Of Breath  . Penicillins Hives and Itching    Patient Measurements: Height: 5\' 5"  (165.1 cm) Weight: 222 lb 14.2 oz (101.1 kg) IBW/kg (Calculated) : 57    Vital Signs: Temp: 98.4 F (36.9 C) (10/28 2136) Temp src: Oral (10/28 2136) BP: 149/90 mmHg (10/28 2136) Pulse Rate: 101  (10/28 1930)  Labs:  Basename 04/28/12 1658  HGB 10.8*  HCT 30.7*  PLT 246  APTT --  LABPROT --  INR --  HEPARINUNFRC --  CREATININE 0.87  CKTOTAL --  CKMB --  TROPONINI --    Estimated Creatinine Clearance: 92.1 ml/min (by C-G formula based on Cr of 0.87).   Medical History: Past Medical History  Diagnosis Date  . MS (multiple sclerosis)   . Hypertension   . Depression   . Glaucoma(365)     Medications:  Scheduled:    . amantadine  100 mg Oral BID  . aspirin EC  81 mg Oral Daily  . Dimethyl Fumarate  1 capsule Oral BID  . enoxaparin (LOVENOX) injection  1 mg/kg Subcutaneous Q12H  . furosemide  40 mg Intravenous Q12H  . furosemide  80 mg Intravenous Once  . gabapentin  600 mg Oral TID  . imipramine  100 mg Oral QHS  . lisinopril  5 mg Oral Daily  . multivitamin with minerals  1 tablet Oral Daily  . nitroGLYCERIN  0.5 inch Topical Once  . nitroGLYCERIN  0.5 inch Topical Q6H  . potassium chloride  20 mEq Oral BID  . sodium chloride  3 mL Intravenous Q12H  . Travoprost (BAK Free)  1 drop Both Eyes QHS  . zolpidem  5 mg Oral Once   Infusions:    Assessment: 51 yo admitted with SOB, CHF, MD ordering full dose Lovenox until ACS and PE r/o. Goal of Therapy:  Anti X = 0.6-1.2 units/ml     Plan:   Lovenox 100mg  sq q12h.  F/U SCr/ levels if needed.  Susanne Greenhouse R 04/29/2012,12:22 AM

## 2012-04-29 NOTE — Progress Notes (Signed)
CARE MANAGEMENT NOTE 04/29/2012  Patient:  CALAH, GERSHMAN   Account Number:  [id number]  Date Initiated:  04/29/2012  Documentation initiated by:  Paislea Hatton  Subjective/Objective Assessment:   patient with hx of ms and bed bound, presented with pulmonary edema and r/o pul. emboli, diuresis with iv lasix and some improvement     Action/Plan:   from home  has home health aide [facility] loving care, needs heart failure program upon dishcarge   Anticipated DC Date:  05/02/2012   Anticipated DC Plan:  HOME W HOME HEALTH SERVICES  In-house referral  NA      DC Planning Services  CM consult      [facility] Choice  HOME HEALTH   Choice offered to / List presented to:             Status of service:  In process, will continue to follow Medicare Important Message given?   (If response is "NO", the following Medicare IM given date fields will be blank) Date Medicare IM given:   Date Additional Medicare IM given:    Discharge Disposition:    Per UR Regulation:  Reviewed for med. necessity/level of care/duration of stay  If discussed at Long Length of Stay Meetings, dates discussed:    Comments:  [id number] Earlene Plater, RN, BSN, CCM: CHART REVIEWED AND UPDATED. NO DISCHARGE NEEDS PRESENT AT THIS TIME. CASE MANAGEMENT [phone]

## 2012-04-29 NOTE — Progress Notes (Signed)
INITIAL ADULT NUTRITION ASSESSMENT Date: 04/29/2012   Time: 11:33 AM Reason for Assessment: Nutrition Risk   ASSESSMENT: Female 51 y.o.  Dx: Acute respiratory failure  Hx:  Past Medical History  Diagnosis Date  . MS (multiple sclerosis)   . Hypertension   . Depression   . Glaucoma(365)     Related Meds:  Scheduled Meds:   . amantadine  100 mg Oral BID  . antiseptic oral rinse  15 mL Mouth Rinse BID  . aspirin EC  81 mg Oral Daily  . darifenacin  7.5 mg Oral Daily  . Dimethyl Fumarate  1 capsule Oral BID  . enoxaparin (LOVENOX) injection  40 mg Subcutaneous Q24H  . furosemide  40 mg Intravenous Q12H  . furosemide  80 mg Intravenous Once  . gabapentin  600 mg Oral TID  . imipramine  100 mg Oral QHS  . lisinopril  5 mg Oral Daily  . multivitamin with minerals  1 tablet Oral Daily  . nitroGLYCERIN  0.5 inch Topical Once  . nitroGLYCERIN  0.5 inch Topical Q6H  . potassium chloride  20 mEq Oral BID  . sodium chloride  3 mL Intravenous Q12H  . Travoprost (BAK Free)  1 drop Both Eyes QHS  . zolpidem  5 mg Oral Once  . DISCONTD: enoxaparin (LOVENOX) injection  1 mg/kg Subcutaneous Q12H   Continuous Infusions:  PRN Meds:.sodium chloride, acetaminophen, iohexol, ondansetron (ZOFRAN) IV, sodium chloride   Ht: 5\' 5"  (165.1 cm)  Wt: 226 lb 8 oz (102.74 kg)  Ideal Wt: 57 kg  % Ideal Wt: 180% Wt Readings from Last 10 Encounters:  04/29/12 226 lb 8 oz (102.74 kg)  Per nutrition risk screen and pt reported 25 lb weight loss over the past few months.   Body mass index is 37.69 kg/(m^2). (Obesity class II)  Food/Nutrition Related Hx: Patient reported a fair appetite. She reported PTA she would eat some of two meals daily, depending on how much food she could get down. From patient report pt likely < 75% of estimated energy needs. Noted per MD, patient with +1 edema. Patient agreed to try Ensure to increase PO intake.   Labs:  CMP     Component Value Date/Time   NA 141  04/29/2012 0535   K 2.9* 04/29/2012 0535   CL 101 04/29/2012 0535   CO2 30 04/29/2012 0535   GLUCOSE 138* 04/29/2012 0535   BUN 12 04/29/2012 0535   CREATININE 0.87 04/29/2012 0535   CALCIUM 8.8 04/29/2012 0535   PROT 5.8* 04/29/2012 0535   ALBUMIN 3.3* 04/29/2012 0535   AST 92* 04/29/2012 0535   ALT 241* 04/29/2012 0535   ALKPHOS 197* 04/29/2012 0535   BILITOT 0.8 04/29/2012 0535   GFRNONAA 77* 04/29/2012 0535   GFRAA 89* 04/29/2012 0535     Intake/Output Summary (Last 24 hours) at 04/29/12 1139 Last data filed at 04/29/12 0600  Gross per 24 hour  Intake    509 ml  Output   5125 ml  Net  -4616 ml      Diet Order: Cardiac  Supplements/Tube Feeding: None at this time   IVF:    Estimated Nutritional Needs:   Kcal: 7846-9629 Protein: 113-123 grams  Fluid: 1 ml per kcal intake   NUTRITION DIAGNOSIS: -Inadequate oral intake (NI-2.1).  Status: Ongoing  RELATED TO: poor appetite and weight loss  AS EVIDENCE BY: pt report of intake meets < 75% of estimated energy needs and weight loss of 25 lb over the  past few months.   MONITORING/EVALUATION(Goals): PO intake, weights, labs 1. PO intake > 75% of estimated energy needs 2. Minimize weight loss  EDUCATION NEEDS: -No education needs identified at this time  INTERVENTION: 1. Will order patient Chocolate Ensure BID, provides 500 kcal and 18 grams of protein daily.  2. RD to follow for nutrition plan of care.   Dietitian [phone]  DOCUMENTATION CODES Per approved criteria  -Obesity Unspecified    Iven Finn [facility] 04/29/2012, 11:33 AM

## 2012-04-29 NOTE — Progress Notes (Signed)
  Echocardiogram 2D Echocardiogram has been performed.  Shannon Garrett 04/29/2012, 3:10 PM

## 2012-04-30 ENCOUNTER — Encounter (HOSPITAL_COMMUNITY): Payer: Self-pay | Admitting: Physician Assistant

## 2012-04-30 DIAGNOSIS — I5021 Acute systolic (congestive) heart failure: Principal | ICD-10-CM

## 2012-04-30 DIAGNOSIS — L259 Unspecified contact dermatitis, unspecified cause: Secondary | ICD-10-CM

## 2012-04-30 DIAGNOSIS — E8779 Other fluid overload: Secondary | ICD-10-CM

## 2012-04-30 DIAGNOSIS — J9600 Acute respiratory failure, unspecified whether with hypoxia or hypercapnia: Secondary | ICD-10-CM

## 2012-04-30 LAB — RAPID URINE DRUG SCREEN, HOSP PERFORMED
Benzodiazepines: NOT DETECTED
Opiates: NOT DETECTED

## 2012-04-30 LAB — CBC
MCV: 78.8 fL (ref 78.0–100.0)
Platelets: 227 10*3/uL (ref 150–400)
RDW: 15.5 % (ref 11.5–15.5)
WBC: 4.1 10*3/uL (ref 4.0–10.5)

## 2012-04-30 LAB — BASIC METABOLIC PANEL
CO2: 32 mEq/L (ref 19–32)
Calcium: 9 mg/dL (ref 8.4–10.5)
Creatinine, Ser: 0.99 mg/dL (ref 0.50–1.10)
GFR calc Af Amer: 76 mL/min — ABNORMAL LOW (ref >90)
GFR calc non Af Amer: 66 mL/min — ABNORMAL LOW (ref >90)

## 2012-04-30 LAB — PRO B NATRIURETIC PEPTIDE: Pro B Natriuretic peptide (BNP): 10618 pg/mL — ABNORMAL HIGH (ref 0–125)

## 2012-04-30 MED ORDER — SPIRONOLACTONE 12.5 MG HALF TABLET
12.5000 mg | ORAL_TABLET | Freq: Every day | ORAL | Status: DC
  Administered 2012-04-30 – 2012-05-01 (×2): 12.5 mg via ORAL
  Filled 2012-04-30 (×2): qty 1

## 2012-04-30 MED ORDER — CARVEDILOL 3.125 MG PO TABS
3.1250 mg | ORAL_TABLET | Freq: Two times a day (BID) | ORAL | Status: DC
  Administered 2012-04-30 – 2012-05-01 (×2): 3.125 mg via ORAL
  Filled 2012-04-30 (×4): qty 1

## 2012-04-30 NOTE — Progress Notes (Signed)
Patient ID: Shannon Garrett, female   DOB: 1960-12-09, 51 y.o.   MRN: [id number]  [facility] PROGRESS NOTE  Shannon Garrett DOB: 1961/04/20 DOA: 04/28/2012 PCP: Jeri Cos, MD  Brief narrative: Pt is 51 y/o female with with hx of multiple sclerosis, HTN and who has no known hx of CAD or CHF. Pt presented to [facility] ED this time for further evaluation of progressively worsening shortness of breath, associated with 3 pillow orthopnea and lower extremity edema, decreased urine output. She reports usually taking Lasix at home 40 mg tablet daily but that did not seem to alleviate the problems. Pt reported similar problem in the past as says 2 D ECHO at that time was unremarkable as far as she knows.  Principal Problem:  *Acute respiratory failure - secondary to systolic CHF exacerbation - 2 D ECHO indicates severely reduced systolic function 25% - appreciate cardiology input - will continue Lasix IV as recommended and will plan to transition to PO in AM - continue to monitor daily weights and I's/O's - BMP in AM Active Problems:  OBESITY - nutrition recommendations provided  ANEMIA, CHRONIC - Hg and Hct are stable and at pt's baseline  MULTIPLE SCLEROSIS, PROGRESSIVE/RELAPSING - currently stable  HYPERTENSION, BENIGN ESSENTIAL - stable - will continue to monitor vitals per floor protocol  CHF exacerbation - secondary to systolic CHF - continue Lasix as noted above - will follow upon cardiology recommendations Transaminitis - LFT's trending down - will repeat CMET in AM  Consultants:  Cardiology  Procedures/Studies: Ct Angio Chest Pe W/cm &/or Wo Cm 04/29/2012   IMPRESSION:   1.  Negative for pulmonary embolus.  2.  Scattered ground-glass attenuation with interlobular septal thickening likely due to pulmonary edema in this patient with marked cardiomegaly and a small right effusion.    Antibiotics:  None  Code Status: Full Family Communication: Pt at  bedside Disposition Plan: Home when medically stable  HPI/Subjective: No events overnight.   Objective: Filed Vitals:   04/29/12 2000 04/30/12 0512 04/30/12 0931 04/30/12 1506  BP: 134/86 140/88 132/68 129/70  Pulse: 103 102 96 98  Temp: 98.1 F (36.7 C) 97.7 F (36.5 C) 98.8 F (37.1 C) 99.3 F (37.4 C)  TempSrc: Oral Oral Oral Axillary  Resp: 20 22 24 18   Height: 5\' 5"  (1.651 m)     Weight: 98.839 kg (217 lb 14.4 oz)     SpO2: 100% 100% 100% 100%    Intake/Output Summary (Last 24 hours) at 04/30/12 1729 Last data filed at 04/30/12 1507  Gross per 24 hour  Intake   1320 ml  Output   5975 ml  Net  -4655 ml    Exam:   General:  Pt is alert, follows commands appropriately, not in acute distress  Cardiovascular: Regular rhythm, tachycardic, S1/S2, no murmurs, no rubs, no gallops  Respiratory: Clear to auscultation bilaterally with bibasilar crackels  Abdomen: Soft, non tender, non distended, bowel sounds present, no guarding  Extremities: Bilateral lower extremity edema, pulses DP and PT palpable bilaterally  Neuro: Grossly nonfocal  Data Reviewed: Basic Metabolic Panel:  Lab 04/30/12 5784 04/29/12 1110 04/29/12 0535 04/28/12 2330 04/28/12 1658  NA 142 -- 141 -- 140  K 4.2 -- 2.9* -- 3.4*  CL 103 -- 101 -- 102  CO2 32 -- 30 -- 24  GLUCOSE 101* -- 138* -- 99  BUN 11 -- 12 -- 13  CREATININE 0.99 -- 0.87 -- 0.87  CALCIUM 9.0 -- 8.8 -- 8.9  MG --  1.8 -- 1.8 --  PHOS -- -- -- -- --   Liver Function Tests:  Lab 04/29/12 0535 04/28/12 1658  AST 92* 121*  ALT 241* 293*  ALKPHOS 197* 228*  BILITOT 0.8 0.8  PROT 5.8* 6.6  ALBUMIN 3.3* 3.6   No results found for this basename: LIPASE:5,AMYLASE:5 in the last 168 hours No results found for this basename: AMMONIA:5 in the last 168 hours CBC:  Lab 04/30/12 0455 04/28/12 1658  WBC 4.1 4.2  NEUTROABS -- 2.5  HGB 10.3* 10.8*  HCT 28.6* 30.7*  MCV 78.8 77.5*  PLT 227 246   Cardiac Enzymes:  Lab 04/29/12  1110 04/29/12 0530 04/28/12 2309  CKTOTAL -- -- --  CKMB -- -- --  CKMBINDEX -- -- --  TROPONINI <0.30 <0.30 <0.30   BNP: No components found with this basename: POCBNP:5 CBG:  Lab 04/29/12 1626  GLUCAP 121*    Recent Results (from the past 240 hour(s))  MRSA PCR SCREENING     Status: Normal   Collection Time   04/28/12 11:34 PM      Component Value Range Status Comment   MRSA by PCR NEGATIVE  NEGATIVE Final      Scheduled Meds:   . amantadine  100 mg Oral BID  . antiseptic oral rinse  15 mL Mouth Rinse BID  . aspirin EC  81 mg Oral Daily  . carvedilol  3.125 mg Oral BID WC  . darifenacin  7.5 mg Oral Daily  . Dimethyl Fumarate  1 capsule Oral BID  . enoxaparin (LOVENOX) injection  50 mg Subcutaneous Q24H  . feeding supplement  237 mL Oral BID BM  . furosemide  40 mg Intravenous Q12H  . gabapentin  600 mg Oral TID  . imipramine  100 mg Oral QHS  . lisinopril  5 mg Oral Daily  . multivitamin with minerals  1 tablet Oral Daily  . nitroGLYCERIN  0.5 inch Topical Q6H  . potassium chloride  40 mEq Oral Q4H  . sodium chloride  3 mL Intravenous Q12H  . spironolactone  12.5 mg Oral Daily  . Travoprost (BAK Free)  1 drop Both Eyes QHS   Continuous Infusions:    Debbora Presto, MD  [facility] Pager [phone]  If 7PM-7AM, please contact night-coverage [email or web address] Password [email or web address] 04/30/2012, 5:29 PM   LOS: 2 days

## 2012-04-30 NOTE — Consult Note (Signed)
CARDIOLOGY CONSULT NOTE  Patient ID: Shannon Garrett, MRN: [id number], DOB/AGE: February 13, 1961 51 y.o. Admit date: 04/28/2012   Date of Consult: 04/30/2012 Primary Physician: Jeri Cos, MD Primary Cardiologist: new  Chief Complaint: SOB Reason for Consult: CHF, EF now 20-25% in setting of patient with MS previously treated with chemotherapy  HPI: 50 y/o F with hx of multiple sclerosis mostly scooter-bound, HTN who has no known hx of CAD or CHF. 2D echo 12/2010 showed normal LV/RV (pt states done for SOB). She was admitted to [facility] 04/28/12 with 2-week hx of progressive worsening SOB, LE edema, orthopnea, and decreased UOP. Usually takes Lasix 40mg  at home but felt this wasn't working. BP 156/104 on admit, P generally around 100. She was treated with bipap in the ED. CT angio showed no PE but +ground-glass attenuation with interlobular septal thickening likely due to pulmonary edema with marked cardiomegaly and a small right effusion; incidentally imaged upper abdomen demonstrates reflux of contrast material into IVC & hepatic veins compatible with right heart insufficiency. LFTs elevated, Hgb 10.3, CEs neg x 4. She was started on Lasix 40mg  IV BID - neg 8L so far (weights inconsistent in computer).She feels markedly better and is asking if she can go home tomorrow. She does endorse a sense of chest soreness when she was struggling to breathe at home, but no major precipitating event leading up to onset of her SOB. She does not exert herself due to her MS.   She was previously treated with Novantrone, Tysabri, and Copaxone but she believes this was before her June 2012 echo. In the interim she's been on Symmetrel and Dimethyl Fumerate.  Past Medical History  Diagnosis Date  . MS (multiple sclerosis)     a. Dx'd late 20's. b. Tx with Novantrone, Tysabri, Copaxone previously.  Marland Kitchen Hypertension   . Depression   . Glaucoma(365)       Most Recent Cardiac Studies: 2D Echo 04/29/12 Study Conclusions -  Left ventricle: The cavity size was mildly dilated. Wall thickness was increased in a pattern of mild LVH. Systolic function was severely reduced. The estimated ejection fraction was in the range of 20% to 25%. Diffuse hypokinesis. Doppler parameters are consistent with high ventricular filling pressure. - Aortic valve: Mild regurgitation. - Mitral valve: Moderate regurgitation. - Left atrium: The atrium was moderately dilated. - Right ventricle: The cavity size was mildly dilated. - Right atrium: The atrium was mildly dilated. - Tricuspid valve: Moderate regurgitation. - Pulmonary arteries: Systolic pressure was mildly increased. PA peak pressure: 51mm Hg (S). - Pericardium, extracardiac: A small pericardial effusion was identified.  12/2010 Echo Study Conclusions - Left ventricle: The cavity size was normal. Wall thickness was   normal. Systolic function was normal. The estimated ejection fraction was 55%. Although no diagnostic regional wall motion   abnormality was identified, this possibility cannot be completely   excluded on the basis of this study. Doppler parameters are consistent with abnormal left ventricular relaxation (grade 1   diastolic dysfunction). - Aortic valve: Mild regurgitation. - Mitral valve: Trivial regurgitation. - Left atrium: The atrium was mildly dilated. - Right ventricle: The cavity size was normal. Systolic function was normal. - Pulmonary arteries: No complete TR doppler jet so unable to estimate PA systolic pressure. - Inferior vena cava: The vessel was normal in size; the respirophasic diameter changes were in the normal range (= 50%); findings are consistent with normal central venous pressure. Impressions:- Normal LV size and systolic function, EF 55%. Normal RV size  and systolic function. Mild aortic insufficiency.   Surgical History:  Past Surgical History  Procedure Date  . Ablation     uterine  . Cesarean section      Home Meds: Prior to  Admission medications   Medication Sig Start Date End Date Taking? Authorizing Provider  amantadine (SYMMETREL) 100 MG capsule Take 100 mg by mouth 2 (two) times daily.    Yes Historical Provider, MD  Cholecalciferol (VITAMIN D3 PO) Take 1 tablet by mouth daily.     Yes Historical Provider, MD  Cyanocobalamin (VITAMIN B12 PO) Take 1 tablet by mouth daily.     Yes Historical Provider, MD  Dimethyl Fumarate (TECFIDERA) 240 MG CPDR Take 1 capsule by mouth 2 (two) times daily.   Yes Historical Provider, MD  furosemide (LASIX) 40 MG tablet Take 40 mg by mouth daily.     Yes Historical Provider, MD  gabapentin (NEURONTIN) 600 MG tablet Take 600 mg by mouth 3 (three) times daily.    Yes Historical Provider, MD  imipramine (TOFRANIL) 50 MG tablet Take 100 mg by mouth at bedtime.     Yes Historical Provider, MD  Multiple Vitamin (MULTIVITAMIN WITH MINERALS) TABS Take 1 tablet by mouth daily.   Yes Historical Provider, MD  solifenacin (VESICARE) 10 MG tablet Take 5 mg by mouth 2 (two) times daily.     Yes Historical Provider, MD  Travoprost, BAK Free, (TRAVATAN) 0.004 % SOLN ophthalmic solution Place 1 drop into both eyes at bedtime.    Yes Historical Provider, MD  zolpidem (AMBIEN) 5 MG tablet Take 5 mg by mouth at bedtime.    Yes Historical Provider, MD    Inpatient Medications:     . amantadine  100 mg Oral BID  . antiseptic oral rinse  15 mL Mouth Rinse BID  . aspirin EC  81 mg Oral Daily  . darifenacin  7.5 mg Oral Daily  . Dimethyl Fumarate  1 capsule Oral BID  . enoxaparin (LOVENOX) injection  50 mg Subcutaneous Q24H  . feeding supplement  237 mL Oral BID BM  . furosemide  40 mg Intravenous Q12H  . gabapentin  600 mg Oral TID  . imipramine  100 mg Oral QHS  . lisinopril  5 mg Oral Daily  . multivitamin with minerals  1 tablet Oral Daily  . nitroGLYCERIN  0.5 inch Topical Q6H  . potassium chloride  40 mEq Oral Q4H  . sodium chloride  3 mL Intravenous Q12H  . Travoprost (BAK Free)  1 drop  Both Eyes QHS    Allergies:  Allergies  Allergen Reactions  . Sulfonamide Derivatives Shortness Of Breath  . Penicillins Hives and Itching    History   Social History  . Marital Status: Single    Spouse Name: N/A    Number of Children: N/A  . Years of Education: N/A   Occupational History  . Not on file.   Social History Main Topics  . Smoking status: Never Smoker   . Smokeless tobacco: Never Used  . Alcohol Use: No  . Drug Use: No  . Sexually Active:    Other Topics Concern  . Not on file   Social History Narrative  . No narrative on file     Family History  Problem Relation Age of Onset  . Hypertension Mother      Review of Systems General: negative for chills, fever, night sweats Cardiovascular: see above. No palpitations.  Dermatological: negative for rash Respiratory: negative for cough  or wheezing Urologic: negative for hematuria Abdominal: negative for nausea, vomiting, diarrhea, bright red blood per rectum, melena, or hematemesis Neurologic: negative for visual changes, syncope, or dizziness. General weakness from MS All other systems reviewed and are otherwise negative except as noted above.  Labs:  [facility] 04/29/12 1110 04/29/12 0530 04/28/12 2309  CKTOTAL -- -- --  CKMB -- -- --  TROPONINI <0.30 <0.30 <0.30   Lab Results  Component Value Date   WBC 4.1 04/30/2012   HGB 10.3* 04/30/2012   HCT 28.6* 04/30/2012   MCV 78.8 04/30/2012   PLT 227 04/30/2012     Lab 04/30/12 0455 04/29/12 0535  NA 142 --  K 4.2 --  CL 103 --  CO2 32 --  BUN 11 --  CREATININE 0.99 --  CALCIUM 9.0 --  PROT -- 5.8*  BILITOT -- 0.8  ALKPHOS -- 197*  ALT -- 241*  AST -- 92*  GLUCOSE 101* --    Radiology/Studies:   See above for echo  Dg Chest 2 View10/28/2013  *RADIOLOGY REPORT*  Clinical Data: Hypertension.  Short of breath.  Chest pain.  CHEST - 2 VIEW  Comparison: 05/19/2009  Findings: Artifact overlies chest.  There is enlargement of the cardiac  silhouette that could be due to appear cardial effusion and/or cardiomegaly.  There is venous hypertension with mild interstitial edema.  There is a small amount of fluid in the posterior costophrenic angles.  No focal pulmonary lesion.  No significant bony finding.  IMPRESSION: Enlarged cardiac silhouette that could be due to cardiomegaly and/or pericardial fluid.  Pulmonary venous hypertension and mild interstitial edema.   Original Report Authenticated By: Thomasenia Sales, M.D.    Ct Angio Chest Pe W/cm &/or Wo Cm 04/29/2012  *RADIOLOGY REPORT*  Clinical Data: Worsening shortness of breath.  Lower extremity edema.  CT ANGIOGRAPHY CHEST  Technique:  Multidetector CT imaging of the chest using the standard protocol during bolus administration of intravenous contrast. Multiplanar reconstructed images including MIPs were obtained and reviewed to evaluate the vascular anatomy.  Contrast: OMNIPAQUE IOHEXOL 350 MG/ML SOLN  Comparison: Plain films of the chest 04/28/2012 and CT chest 05/20/2009.  Findings: No pulmonary embolus is identified.  The patient has a small right pleural effusion.  There is no left pleural effusion or pericardial effusion.  Marked cardiomegaly is present.  There is no axillary, hilar or mediastinal lymphadenopathy.  Scattered ground- glass attenuation is most prominent in the lower lobes where it appears worse on the right.  There is scattered interlobular septal thickening present.  Incidentally imaged upper abdomen demonstrates reflux of contrast material into the inferior vena cava and hepatic veins compatible with right heart insufficiency.  No focal bony abnormality is identified.  IMPRESSION:  1.  Negative for pulmonary embolus. 2.  Scattered ground-glass attenuation with interlobular septal thickening likely due to pulmonary edema in this patient with marked cardiomegaly and a small right effusion.   Original Report Authenticated By: Bernadene Bell. D'ALESSIO, M.D.    EKG: sinus tach  105bpm, biatrial abnormalities, nonspecific ST-T changes No prior to compare to  Physical Exam: Blood pressure 132/68, pulse 96, temperature 98.8 F (37.1 C), temperature source Oral, resp. rate 24, height 5\' 5"  (1.651 m), weight 217 lb 14.4 oz (98.839 kg), SpO2 100.00%. General: Well developed AAF in no acute distress. Head: Normocephalic, atraumatic, sclera non-icteric, no xanthomas, nares are without discharge.  Neck: Negative for carotid bruits. JVD not elevated. Lungs: Clear bilaterally to auscultation without wheezes, rales, or  rhonchi. Breathing is unlabored. Heart: RRR with S1 S2. No murmurs, rubs, or gallops appreciated. Abdomen: Soft, non-tender, non-distended with normoactive bowel sounds. No hepatomegaly. No rebound/guarding. No obvious abdominal masses. Msk:  Strength and tone appear normal for age. Extremities: No clubbing or cyanosis. 1+ soft LE (L>R) edema. Distal pedal pulses are 2+ and equal bilaterally. Neuro: Alert and oriented X 3. No facial asymmetry. No focal deficit. Moves all extremities spontaneously. Psych:  Responds to questions appropriately with a normal affect.   Assessment and Plan:  1. Acute respiratory failure, improved 2. Acute systolic CHF with EF 20-25%, global hypokinesis 3. Multiple sclerosis, previously tx with multiple chemotherapeutic agents including novantrone 4. Small pericardial effusion by echo - will follow clinically 5. Pulm HTN 6. Nitrite positive UTI - will defer to IM 7. Anemia, appears chronic  CHF is likely nonischemic --she has been exposed to multiple chemotherapeutic agents which can precipitate heart failure, including novantrone which can cause CHF months to years after discontinuation. She is also on symmetrel which has been implicated thus would recommend f/u with neurology to discuss changing to an alternative agent if possible. She has had no symptoms to suggest angina although she does not exert herself - will discuss ischemic  eval with MD. Check lipid panel for completeness of risk stratification. Continue ACE. Would initiate BB therapy now that she is no longer acutely decompensated. Would continue IV Lasix through tonight and consider changing to oral in AM. Add low dose spironolactone. Follow BP.  Signed, Ronie Spies PA-C 04/30/2012, 2:16 PM  Patient seen, examined. Available data reviewed. Agree with findings, assessment, and plan as outlined by Ronie Spies, PA-C. Pt independently interviewed and examined. Physical exam is pertinent for an obese woman in NAD. Cardiac exam shows tachycardia with an S3 gallop. As outlined above, I think this patient has a high-probability of NICM based on chemo exposure, global LV dysfunction, and lack of CAD risk factors. She will be treated medically with addition of carvedilol, spironolactone, and continuation of IV lasix. She is close to being able to convert to oral furosemide and will require continued med titration as an outpatient. I would not recommend an eval for ischemia as per discussion above. Findings and plan discussed with patient who understands.  Tonny Bollman, M.D. 04/30/2012 2:45 PM

## 2012-05-01 DIAGNOSIS — D539 Nutritional anemia, unspecified: Secondary | ICD-10-CM

## 2012-05-01 DIAGNOSIS — I428 Other cardiomyopathies: Secondary | ICD-10-CM

## 2012-05-01 LAB — LIPID PANEL
HDL: 23 mg/dL — ABNORMAL LOW (ref >39)
Total CHOL/HDL Ratio: 6.3 RATIO
Triglycerides: 72 mg/dL (ref <150)
VLDL: 14 mg/dL (ref 0–40)

## 2012-05-01 LAB — CBC
HCT: 30.4 % — ABNORMAL LOW (ref 36.0–46.0)
Hemoglobin: 10.3 g/dL — ABNORMAL LOW (ref 12.0–15.0)
WBC: 4.7 10*3/uL (ref 4.0–10.5)

## 2012-05-01 LAB — COMPREHENSIVE METABOLIC PANEL
BUN: 10 mg/dL (ref 6–23)
Calcium: 9.1 mg/dL (ref 8.4–10.5)
GFR calc Af Amer: 74 mL/min — ABNORMAL LOW (ref >90)
Glucose, Bld: 92 mg/dL (ref 70–99)
Sodium: 140 mEq/L (ref 135–145)
Total Protein: 5.8 g/dL — ABNORMAL LOW (ref 6.0–8.3)

## 2012-05-01 MED ORDER — SPIRONOLACTONE 12.5 MG HALF TABLET: 12.5000 mg | ORAL_TABLET | Freq: Every day | ORAL | Status: DC

## 2012-05-01 MED ORDER — ASPIRIN 81 MG PO TBEC: 81.0000 mg | DELAYED_RELEASE_TABLET | Freq: Every day | ORAL | Status: DC

## 2012-05-01 MED ORDER — CARVEDILOL 3.125 MG PO TABS: 3.1250 mg | ORAL_TABLET | Freq: Two times a day (BID) | ORAL | Status: DC

## 2012-05-01 MED ORDER — LISINOPRIL 5 MG PO TABS: 5.0000 mg | ORAL_TABLET | Freq: Every day | ORAL | Status: DC

## 2012-05-01 MED ORDER — FUROSEMIDE 40 MG PO TABS: 40.0000 mg | ORAL_TABLET | Freq: Two times a day (BID) | ORAL | Status: DC

## 2012-05-01 NOTE — Discharge Summary (Signed)
Physician Discharge Summary  Shannon Garrett WUJ:[id number] DOB: 1960-11-26 DOA: 04/28/2012  PCP: Jeri Cos, MD  Admit date: 04/28/2012 Discharge date: 05/01/2012  Recommendations for Outpatient Follow-up:  1. Pt will need to follow up with PCP in 2-3 weeks post discharge 2. Please obtain BMP to evaluate electrolytes and kidney function 3. Please also check CBC to evaluate Hg and Hct levels 4. Please note that pt was advised to follow up with cardiologist in 1 week post discharge and the information was provided  Discharge Diagnoses: Acute respiratory allure secondary to systolic CHF failure Principal Problem:  *Acute respiratory failure Active Problems:  OBESITY  ANEMIA, CHRONIC  MULTIPLE SCLEROSIS, PROGRESSIVE/RELAPSING  HYPERTENSION, BENIGN ESSENTIAL  CHF exacerbation  Immobility  Other primary cardiomyopathies   Discharge Condition: Stable  Diet recommendation: Heart healthy diet discussed in details   Brief narrative:  Pt is 51 y/o female with with hx of multiple sclerosis, HTN and who has no known hx of CAD or CHF. Pt presented to [facility] ED this time for further evaluation of progressively worsening shortness of breath, associated with 3 pillow orthopnea and lower extremity edema, decreased urine output. She reports usually taking Lasix at home 40 mg tablet daily but that did not seem to alleviate the problems. Pt reported similar problem in the past as says 2 D ECHO at that time was unremarkable as far as she knows.   Principal Problem:  *Acute respiratory failure  - secondary to systolic CHF exacerbation  - 2 D ECHO indicates severely reduced systolic function 25%  - appreciate cardiology input  - we continued Lasix IV as recommended and transitioned to PO  - continued to monitor daily weights and I's/O's  - pt tolerating diuresis well and with no shortness of breath this AM Active Problems:  OBESITY  - nutrition recommendations provided  ANEMIA, CHRONIC  - Hg  and Hct are stable and at pt's baseline  MULTIPLE SCLEROSIS, PROGRESSIVE/RELAPSING  - currently stable  HYPERTENSION, BENIGN ESSENTIAL  - stable  - will continue to monitor vitals per floor protocol  CHF exacerbation  - secondary to systolic CHF  - continue Lasix as noted above  - cardiology recommendations followed Transaminitis  - LFT's trending down  - will repeat CMET in AM   Consultants:  Cardiology  Procedures/Studies:  Ct Angio Chest Pe W/cm &/or Wo Cm  04/29/2012  IMPRESSION:  1. Negative for pulmonary embolus.  2. Scattered ground-glass attenuation with interlobular septal thickening likely due to pulmonary edema in this patient with marked cardiomegaly and a small right effusion.   Antibiotics:  None  Code Status: Full  Family Communication: Pt at bedside   Discharge Exam: Filed Vitals:   05/01/12 1052  BP: 132/66  Pulse:   Temp:   Resp:    Filed Vitals:   04/30/12 1506 04/30/12 1900 05/01/12 0529 05/01/12 1052  BP: 129/70 130/95 110/76 132/66  Pulse: 98 97 96   Temp: 99.3 F (37.4 C) 98.7 F (37.1 C) 97.7 F (36.5 C)   TempSrc: Axillary Oral Oral   Resp: 18 20 20    Height:      Weight:   97.1 kg (214 lb 1.1 oz)   SpO2: 100% 100% 100%     General: Pt is alert, follows commands appropriately, not in acute distress Cardiovascular: Regular rate and rhythm, S1/S2 +, no murmurs, no rubs, no gallops Respiratory: Clear to auscultation bilaterally, no wheezing, no crackles, no rhonchi Abdominal: Soft, non tender, non distended, bowel sounds +, no guarding  Extremities: no edema, no cyanosis, pulses palpable bilaterally DP and PT Neuro: Grossly nonfocal  Discharge Instructions  Discharge Orders    Future Orders Please Complete By Expires   Diet - low sodium heart healthy      Increase activity slowly          Medication List     As of 05/01/2012 11:18 AM    TAKE these medications         amantadine 100 MG capsule   Commonly known as:  SYMMETREL   Take 100 mg by mouth 2 (two) times daily.      aspirin 81 MG EC tablet   Take 1 tablet (81 mg total) by mouth daily.      carvedilol 3.125 MG tablet   Commonly known as: COREG   Take 1 tablet (3.125 mg total) by mouth 2 (two) times daily with a meal.      furosemide 40 MG tablet   Commonly known as: LASIX   Take 1 tablet (40 mg total) by mouth 2 (two) times daily.      gabapentin 600 MG tablet   Commonly known as: NEURONTIN   Take 600 mg by mouth 3 (three) times daily.      imipramine 50 MG tablet   Commonly known as: TOFRANIL   Take 100 mg by mouth at bedtime.      lisinopril 5 MG tablet   Commonly known as: PRINIVIL,ZESTRIL   Take 1 tablet (5 mg total) by mouth daily.      multivitamin with minerals Tabs   Take 1 tablet by mouth daily.      solifenacin 10 MG tablet   Commonly known as: VESICARE   Take 5 mg by mouth 2 (two) times daily.      spironolactone 12.5 mg Tabs   Commonly known as: ALDACTONE   Take 0.5 tablets (12.5 mg total) by mouth daily.      TECFIDERA 240 MG Cpdr   Generic drug: Dimethyl Fumarate   Take 1 capsule by mouth 2 (two) times daily.      Travoprost (BAK Free) 0.004 % Soln ophthalmic solution   Commonly known as: TRAVATAN   Place 1 drop into both eyes at bedtime.      VITAMIN B12 PO   Take 1 tablet by mouth daily.      VITAMIN D3 PO   Take 1 tablet by mouth daily.      zolpidem 5 MG tablet   Commonly known as: AMBIEN   Take 5 mg by mouth at bedtime.           Follow-up Information    Follow up with Jeri Cos, MD.   Contact information:   [address] [place] [address] [phone]       Follow up with Tonny Bollman, MD.   Contact information:   [address] [place] [address] [phone]           The results of significant diagnostics from this hospitalization (including imaging, microbiology, ancillary and laboratory) are listed below for  reference.     Microbiology: Recent Results (from the past 240 hour(s))  MRSA PCR SCREENING     Status: Normal   Collection Time   04/28/12 11:34 PM      Component Value Range Status Comment   MRSA by PCR NEGATIVE  NEGATIVE Final      Labs: Basic Metabolic Panel:  Lab 05/01/12 5784  04/30/12 0455 04/29/12 1110 04/29/12 0535 04/28/12 2330 04/28/12 1658  NA 140 142 -- 141 -- 140  K 3.9 4.2 -- 2.9* -- 3.4*  CL 100 103 -- 101 -- 102  CO2 32 32 -- 30 -- 24  GLUCOSE 92 101* -- 138* -- 99  BUN 10 11 -- 12 -- 13  CREATININE 1.02 0.99 -- 0.87 -- 0.87  CALCIUM 9.1 9.0 -- 8.8 -- 8.9  MG -- -- 1.8 -- 1.8 --  PHOS -- -- -- -- -- --   Liver Function Tests:  Lab 05/01/12 0510 04/29/12 0535 04/28/12 1658  AST 47* 92* 121*  ALT 150* 241* 293*  ALKPHOS 174* 197* 228*  BILITOT 0.5 0.8 0.8  PROT 5.8* 5.8* 6.6  ALBUMIN 3.0* 3.3* 3.6   No results found for this basename: LIPASE:5,AMYLASE:5 in the last 168 hours No results found for this basename: AMMONIA:5 in the last 168 hours CBC:  Lab 05/01/12 0510 04/30/12 0455 04/28/12 1658  WBC 4.7 4.1 4.2  NEUTROABS -- -- 2.5  HGB 10.3* 10.3* 10.8*  HCT 30.4* 28.6* 30.7*  MCV 80.9 78.8 77.5*  PLT 235 227 246   Cardiac Enzymes:  Lab 04/29/12 1110 04/29/12 0530 04/28/12 2309  CKTOTAL -- -- --  CKMB -- -- --  CKMBINDEX -- -- --  TROPONINI <0.30 <0.30 <0.30   BNP: BNP (last 3 results)  Basename 04/30/12 0455 04/28/12 1658  PROBNP 10618.0* 16405.0*   CBG:  Lab 04/29/12 1626  GLUCAP 121*     SIGNED: Time coordinating discharge: Over 30 minutes  Debbora Presto, MD  [facility] 05/01/2012, 11:18 AM Pager [phone]  If 7PM-7AM, please contact night-coverage [email or web address] Password [email or web address]

## 2012-05-01 NOTE — Progress Notes (Signed)
   Subjective:  Denies CP or dyspnea   Objective:  Filed Vitals:   04/30/12 0931 04/30/12 1506 04/30/12 1900 05/01/12 0529  BP: 132/68 129/70 130/95 110/76  Pulse: 96 98 97 96  Temp: 98.8 F (37.1 C) 99.3 F (37.4 C) 98.7 F (37.1 C) 97.7 F (36.5 C)  TempSrc: Oral Axillary Oral Oral  Resp: 24 18 20 20   Height:      Weight:    214 lb 1.1 oz (97.1 kg)  SpO2: 100% 100% 100% 100%    Intake/Output from previous day:  Intake/Output Summary (Last 24 hours) at 05/01/12 0755 Last data filed at 05/01/12 0533  Gross per 24 hour  Intake    732 ml  Output   3900 ml  Net  -3168 ml    Physical Exam: Physical exam: Well-developed well-nourished in no acute distress.  Skin is warm and dry.  HEENT is normal.  Neck is supple.  Chest is clear to auscultation with normal expansion.  Cardiovascular exam is regular rate and rhythm.  Abdominal exam nontender or distended. No masses palpated. Extremities show no edema. neuro grossly intact    Lab Results: Basic Metabolic Panel:  Basename 05/01/12 0510 04/30/12 0455 04/29/12 1110 04/28/12 2330  NA 140 142 -- --  K 3.9 4.2 -- --  CL 100 103 -- --  CO2 32 32 -- --  GLUCOSE 92 101* -- --  BUN 10 11 -- --  CREATININE 1.02 0.99 -- --  CALCIUM 9.1 9.0 -- --  MG -- -- 1.8 1.8  PHOS -- -- -- --   CBC:  Basename 05/01/12 0510 04/30/12 0455 04/28/12 1658  WBC 4.7 4.1 --  NEUTROABS -- -- 2.5  HGB 10.3* 10.3* --  HCT 30.4* 28.6* --  MCV 80.9 78.8 --  PLT 235 227 --   Cardiac Enzymes:  Basename 04/29/12 1110 04/29/12 0530 04/28/12 2309  CKTOTAL -- -- --  CKMB -- -- --  CKMBINDEX -- -- --  TROPONINI <0.30 <0.30 <0.30     Assessment/Plan:  1 acute systolic congestive heart failure-volume status is much improved. Would change Lasix to 40 mg by mouth twice a day. Continue spironolactone. Check BMET 4 days after DC. 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 Continue ACE inhibitor and beta blocker. Followup with Dr. Excell Seltzer 2 weeks  following discharge. She will need continued titration of medications and once fully titrated repeat echocardiogram 3 months later. If LV function remains less than 35% would need to consider ICD. I would favor nuclear study after discharge to screen for ischemia but she will discuss this further with Dr. Excell Seltzer. The patient also needs to followup with neurology for considering alternative medications 3 Hypertension-blood pressure is presently controlled.  4 multiple sclerosis-management per primary care.  Shannon Garrett 05/01/2012, 7:55 AM

## 2012-05-01 NOTE — Care Management Note (Signed)
    Page 1 of 2   05/01/2012     2:29:13 PM   CARE MANAGEMENT NOTE 05/01/2012  Patient:  Shannon Garrett, Shannon Garrett   Account Number:  [id number]  Date Initiated:  04/29/2012  Documentation initiated by:  DAVIS,RHONDA  Subjective/Objective Assessment:   patient with hx of ms and bed bound, presented with pulmonary edema and r/o pul. emboli, diuresis with iv lasix and some improvement     Action/Plan:   from home  has home health aide [facility], needs heart failure program upon dishcarge   Anticipated DC Date:  05/01/2012   Anticipated DC Plan:  HOME W HOME HEALTH SERVICES  In-house referral  NA      DC Planning Services  CM consult      [facility]  HOME HEALTH   Choice offered to / List presented to:  [name]        HH arranged  HH-1 RN  HH-10 DISEASE MANAGEMENT      HH agency  [facility]   Status of service:  Completed, signed off Medicare Important Message given?   (If response is "NO", the following Medicare IM given date fields will be blank) Date Medicare IM given:   Date Additional Medicare IM given:    Discharge Disposition:  HOME W HOME HEALTH SERVICES  Per UR Regulation:  Reviewed for med. necessity/level of care/duration of stay  If discussed at Long Length of Stay Meetings, dates discussed:    Comments:  05/01/12 Khelani Kops MAHBIR RN,BSN NCM [phone] [facility] SUSAN(LIAS[place]) INFORMED OF D/C & HH CHF PROTOCAL.[facility] TEL#[phone],FAX#[phone],W/COFIRMATION OF D/C SUMMARY FOR NURSE'S AIDE SERVICES TO BE RESUMED.  [id number] Earlene Plater, RN, BSN, CCM: CHART REVIEWED AND UPDATED. NO DISCHARGE NEEDS PRESENT AT THIS TIME. CASE MANAGEMENT [phone]

## 2012-05-15 ENCOUNTER — Encounter: Payer: Self-pay | Admitting: Physician Assistant

## 2012-05-15 ENCOUNTER — Ambulatory Visit (INDEPENDENT_AMBULATORY_CARE_PROVIDER_SITE_OTHER): Payer: PRIVATE HEALTH INSURANCE | Admitting: Physician Assistant

## 2012-05-15 VITALS — BP 144/89 | HR 96 | Ht 65.0 in | Wt 219.0 lb

## 2012-05-15 DIAGNOSIS — I5023 Acute on chronic systolic (congestive) heart failure: Secondary | ICD-10-CM

## 2012-05-15 DIAGNOSIS — I428 Other cardiomyopathies: Secondary | ICD-10-CM

## 2012-05-15 DIAGNOSIS — D649 Anemia, unspecified: Secondary | ICD-10-CM

## 2012-05-15 DIAGNOSIS — G35 Multiple sclerosis: Secondary | ICD-10-CM

## 2012-05-15 DIAGNOSIS — I10 Essential (primary) hypertension: Secondary | ICD-10-CM

## 2012-05-15 DIAGNOSIS — R7989 Other specified abnormal findings of blood chemistry: Secondary | ICD-10-CM

## 2012-05-15 LAB — HEPATIC FUNCTION PANEL
ALT: 49 U/L — ABNORMAL HIGH (ref 0–35)
Total Bilirubin: 0.7 mg/dL (ref 0.3–1.2)

## 2012-05-15 LAB — CBC WITH DIFFERENTIAL/PLATELET
Basophils Absolute: 0.1 10*3/uL (ref 0.0–0.1)
Eosinophils Relative: 6.9 % — ABNORMAL HIGH (ref 0.0–5.0)
HCT: 32.5 % — ABNORMAL LOW (ref 36.0–46.0)
Lymphs Abs: 0.8 10*3/uL (ref 0.7–4.0)
MCV: 83.4 fl (ref 78.0–100.0)
Monocytes Absolute: 0.3 10*3/uL (ref 0.1–1.0)
Platelets: 222 10*3/uL (ref 150.0–400.0)
RDW: 17.2 % — ABNORMAL HIGH (ref 11.5–14.6)

## 2012-05-15 LAB — BASIC METABOLIC PANEL
BUN: 13 mg/dL (ref 6–23)
CO2: 29 mEq/L (ref 19–32)
Chloride: 104 mEq/L (ref 96–112)
Glucose, Bld: 115 mg/dL — ABNORMAL HIGH (ref 70–99)
Potassium: 3.2 mEq/L — ABNORMAL LOW (ref 3.5–5.1)

## 2012-05-15 MED ORDER — FUROSEMIDE 40 MG PO TABS: 60.0000 mg | ORAL_TABLET | Freq: Two times a day (BID) | ORAL | Status: DC

## 2012-05-15 MED ORDER — SPIRONOLACTONE 25 MG PO TABS: 25.0000 mg | ORAL_TABLET | Freq: Every day | ORAL | Status: DC

## 2012-05-15 NOTE — Progress Notes (Signed)
[address] [place], [place]  [address] Phone: [phone], Fax:  [phone]  Date:  05/15/2012   Name:  Shannon Garrett   DOB:  1960/11/29   MRN:  [id number]  PCP:  Jeri Cos, MD  Primary Cardiologist:  Dr. Tonny Bollman  Primary Electrophysiologist:  None    History of Present Illness: Shannon Garrett is a 51 y.o. female returns for a recent admission to the hospital and on acute systolic congestive heart.  History of multiple sclerosis, HTN and depression. She is limited by her multiple sclerosis and is "scooter-bound."  Echo in 6/12 demonstrated normal LVF.  She has been treated with Novantrone, Tysabri, Copaxone, Symmetrel and Dimethyl Fumerate for her MS.  she was admitted 10/28-10/31. She presented with a two-week history of progressively worsening shortness of breath, LE edema, orthopnea and decreased urine output. CT was negative for pulmonary embolism. Findings were suggestive of pulmonary edema. Echo 04/29/12: Mild LVH, EF 20-25%, mild AI, moderate MR, moderate LAE, mild RAE, mild RVE, moderate TR, PASP 51, small pericardial effusion. She was evaluated by cardiology. 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 She has been exposed to multiple chemotherapeutic agents in the past including Novantrone which can cause CHF months to years after discontinuation. Medical therapy was advanced for her congestive heart failure. She was diuresed. Ischemic evaluation was not felt to be necessary.    Patient is doing ok since d/c.  She is here with her caregiver.  She does not weigh herself at home.  She notes DOE.  She is quite limited from her MS but does note dyspnea with upper body exercise.  She still notes orthopnea, but this is improved.  She also notes PND.  LE edema is improved.  LLE is always > RLE.  No chest pain.  No syncope.    Labs (10/13):    K 3.9, creatinine 1.02, ALT 293 => 150, Alb 3, TP 5.8, BNP 10,618, LDL 108, Hgb 10.3,  TSH 3.510  Wt Readings from Last 3 Encounters:  05/15/12 219 lb (99.338 kg)  05/01/12 214 lb 1.1 oz (97.1 kg)     Past Medical History  Diagnosis Date  . MS (multiple sclerosis)     a. Dx'd late 20's. b. Tx with Novantrone, Tysabri, Copaxone previously.  Marland Kitchen Hypertension   . Depression   . Glaucoma(365)   . Cardiomyopathy     a.  Echo 04/29/12: Mild LVH, EF 20-25%, mild AI, moderate MR, moderate LAE, mild RAE, mild RVE, moderate TR, PASP 51, small pericardial effusion;   b. probably non-ischemic given multiple chemo-Tx agents used for MS and global LV dysfn on echo  . Chronic systolic heart failure     Current Outpatient Prescriptions  Medication Sig Dispense Refill  . amantadine (SYMMETREL) 100 MG capsule Take 100 mg by mouth 2 (two) times daily.       Marland Kitchen aspirin EC 81 MG EC tablet Take 1 tablet (81 mg total) by mouth daily.  30 tablet  1  . carvedilol (COREG) 3.125 MG tablet Take 1 tablet (3.125 mg total) by mouth 2 (two) times daily with a meal.  60 tablet  1  . Cholecalciferol (VITAMIN D3 PO) Take 1 tablet by mouth daily.        . Cyanocobalamin (VITAMIN B12 PO) Take 1 tablet by mouth daily.        . Dimethyl Fumarate (TECFIDERA) 240 MG CPDR Take 1 capsule by mouth 2 (two) times daily.      Marland Kitchen  furosemide (LASIX) 40 MG tablet Take 1 tablet (40 mg total) by mouth 2 (two) times daily.  60 tablet  1  . gabapentin (NEURONTIN) 600 MG tablet Take 600 mg by mouth 3 (three) times daily.       Marland Kitchen imipramine (TOFRANIL) 50 MG tablet Take 100 mg by mouth at bedtime.        Marland Kitchen lisinopril (PRINIVIL,ZESTRIL) 5 MG tablet Take 1 tablet (5 mg total) by mouth daily.  30 tablet  1  . Multiple Vitamin (MULTIVITAMIN WITH MINERALS) TABS Take 1 tablet by mouth daily.      . solifenacin (VESICARE) 10 MG tablet Take 5 mg by mouth 2 (two) times daily.        Marland Kitchen spironolactone (ALDACTONE) 12.5 mg TABS Take 6.25 mg by mouth daily.      . Travoprost, BAK Free, (TRAVATAN) 0.004 % SOLN ophthalmic solution Place 1 drop  into both eyes at bedtime.       Marland Kitchen zolpidem (AMBIEN) 5 MG tablet Take 5 mg by mouth at bedtime.       . [DISCONTINUED] spironolactone (ALDACTONE) 12.5 mg TABS Take 0.5 tablets (12.5 mg total) by mouth daily.  30 tablet  1    Allergies: Allergies  Allergen Reactions  . Sulfonamide Derivatives Shortness Of Breath  . Penicillins Hives and Itching    Social History:  The patient  reports that she has never smoked. She has never used smokeless tobacco. She reports that she does not drink alcohol or use illicit drugs.   ROS:  Please see the history of present illness.   She notes dark urine today.  She has no sensation and therefore cannot report dysuria.   All other systems reviewed and negative.   PHYSICAL EXAM: VS:  BP 144/89  Pulse 96  Ht 5\' 5"  (1.651 m)  Wt 219 lb (99.338 kg)  BMI 36.44 kg/m2 Well nourished, well developed, in no acute distress HEENT: normal Neck: minimal JVD at 90 Cardiac:  normal S1, S2; RRR; no murmur Lungs:  clear to auscultation bilaterally, no wheezing, rhonchi or rales Abd: soft, nontender Ext: Trace to 1+ bilateral LE edema, L>R Skin: warm and dry Neuro:  CNs 2-12 intact, no focal abnormalities noted  EKG:  NSR, HR 92, normal axis, interventricular conduction delay, nonspecific ST-T wave changes, no change from prior tracing      ASSESSMENT AND PLAN:  1. Acute on Chronic Systolic CHF:   She is still volume overloaded. I will adjust her Lasix to 60 mg twice daily. I will also adjust her spironolactone to 25 mg daily. Check a basic metabolic panel today. She will have a repeat basic metabolic panel in one week. I will see her back in 2 weeks.  2. Cardiomyopathy:   Likely nonischemic. I reviewed her case today with Dr. Excell Seltzer. Given her significant comorbid illness, she is not a good candidate for ICD therapy. At this point we also do not plan pursuing ischemic workup. Medical therapy appears to be the best option. Continue current dose of carvedilol and  lisinopril. Adjust diuretics above as noted. Attempt titration of medications and followup in 2 weeks.  3. Normocytic Anemia:   This appears to be chronic. I will obtain a repeat CBC today in followup.  4. Transaminitis:   This was likely related to hepatic congestion from CHF. Obtain followup hepatic panel today.  5. Multiple Sclerosis:   Followup with neurology.  Patient was under the impression she was to have HHOT.  We  will request an eval with [facility].    6. Neurogenic Bladder:   Patient notes change in urine color. She follows with urology chronically. I have asked her to followup with her urologist should her urine discoloration continued. She has a history of frequent UTIs.  Signed, Tereso Newcomer, PA-C  2:18 PM 05/15/2012

## 2012-05-15 NOTE — Patient Instructions (Addendum)
INCREASE LASIX TO 60 MG TWICE DAILY  INCREASE ALDACTONE 25 MG DAILY  LAB TODAY BMET, CBC W/DIFF, LFT'S  REPEAT BMET IN 1 WEEK  FOLLOW UP IN 2 WEEKS WITH EITHER DR. Excell Seltzer, IF NOT AVAILABLE THEN WITH SCOTT WEAVER, PAC SAME DAY DR. Excell Seltzer IS IN THE OFFICE

## 2012-05-16 ENCOUNTER — Telehealth: Payer: Self-pay | Admitting: *Deleted

## 2012-05-16 DIAGNOSIS — I5023 Acute on chronic systolic (congestive) heart failure: Secondary | ICD-10-CM

## 2012-05-16 MED ORDER — POTASSIUM CHLORIDE CRYS ER 20 MEQ PO TBCR: 20.0000 meq | EXTENDED_RELEASE_TABLET | Freq: Every day | ORAL | Status: DC

## 2012-05-16 NOTE — Telephone Encounter (Signed)
Message copied by Tarri Fuller on Fri May 16, 2012  8:34 AM ------      Message from: Alex[place], [place] T      Created: Thu May 15, 2012  5:03 PM       Hgb stable.      LFTs improved.      K+ low.      Add K+ 20 mEq QD.      Check BMET again in 4 days instead of one week.      Tereso Newcomer, PA-C  5:02 PM 05/15/2012

## 2012-05-16 NOTE — Telephone Encounter (Signed)
lmptcb to go over lab results and med change w/repeat bmet to be done

## 2012-05-16 NOTE — Telephone Encounter (Signed)
pt notified about lab results and to start K+ 20 qd, pt will come in 11/19 for repeat bmet instead of 11/22 but will still f/u 11/22 w/PA; pt verbalized understanding

## 2012-05-20 ENCOUNTER — Other Ambulatory Visit: Payer: PRIVATE HEALTH INSURANCE

## 2012-05-22 ENCOUNTER — Telehealth: Payer: Self-pay | Admitting: Physician Assistant

## 2012-05-22 ENCOUNTER — Ambulatory Visit (HOSPITAL_COMMUNITY)
Admission: RE | Admit: 2012-05-22 | Discharge: 2012-05-22 | Disposition: A | Discharge status: Home / Self Care | Payer: PRIVATE HEALTH INSURANCE | Source: Ambulatory Visit | Attending: Internal Medicine | Admitting: Internal Medicine

## 2012-05-22 ENCOUNTER — Encounter (HOSPITAL_COMMUNITY): Payer: Self-pay

## 2012-05-22 VITALS — BP 146/108 | HR 94 | Wt 221.0 lb

## 2012-05-22 DIAGNOSIS — I5042 Chronic combined systolic (congestive) and diastolic (congestive) heart failure: Secondary | ICD-10-CM | POA: Insufficient documentation

## 2012-05-22 DIAGNOSIS — I10 Essential (primary) hypertension: Secondary | ICD-10-CM | POA: Insufficient documentation

## 2012-05-22 DIAGNOSIS — I5022 Chronic systolic (congestive) heart failure: Secondary | ICD-10-CM | POA: Insufficient documentation

## 2012-05-22 DIAGNOSIS — I5023 Acute on chronic systolic (congestive) heart failure: Secondary | ICD-10-CM

## 2012-05-22 LAB — BASIC METABOLIC PANEL
CO2: 30 mEq/L (ref 19–32)
Calcium: 9 mg/dL (ref 8.4–10.5)
Chloride: 99 mEq/L (ref 96–112)
Creatinine, Ser: 0.99 mg/dL (ref 0.50–1.10)
Glucose, Bld: 99 mg/dL (ref 70–99)

## 2012-05-22 MED ORDER — LISINOPRIL 5 MG PO TABS: 5.0000 mg | ORAL_TABLET | Freq: Two times a day (BID) | ORAL | Status: DC

## 2012-05-22 MED ORDER — FUROSEMIDE 40 MG PO TABS: 80.0000 mg | ORAL_TABLET | Freq: Two times a day (BID) | ORAL | Status: DC

## 2012-05-22 NOTE — Progress Notes (Signed)
Referring Physician: Lorin Picket , PA at [facility] Primary Care: Dr. Jeri Cos  Primary Cardiologist: Dr Tonny Bollman  Weight Range: Baseline proBNP:  HPI: Shannon Garrett is a 51 y.o. female with history of multiple sclerosis (diagnosed in late 72s).  She has been treated with Novantrone, Tysabri, Copaxone, Symmetrel and Dimethyl Fumerate for her MS.  She is scooter-bound with long distances but can ambulate short distances around the house.  She also has systolic heart failure, EF 25% no prior ischemic work up.  She has hypertension and depression as well.      Admitted 10/28-10/31 to [facility] for a two-week history of progressively worsening shortness of breath, LE edema, orthopnea and decreased urine output. CT was negative for pulmonary embolism. Findings were suggestive of pulmonary edema. Echo 04/29/12: Mild LVH, EF 20-25%, mild AI, moderate MR, moderate LAE, mild RAE, mild RVE, moderate TR, PASP 51, small pericardial effusion.  She was diuresed and discharged home.  Last week she saw Tereso Newcomer, PA at [facility] and lasix increased 60 mg BID and spiro 25 added due to fluid on board.  She has been referred to the HF clinic for further treatment.  She is is here with her caregiver.  She currently is not weighing because she is waiting on scales to come in the mail from [facility].  She has been watching her fluid intake but unsure sodium restrictions.  She has L>R lower extremity edema.  Dyspnea with exertion is minimal.  No orthopnea/PND.  Her caregiver fixes meals for her.      Review of Systems: [y] = yes, [ ]  = no   General: Weight gain [place].Etienne ]; Weight loss [ ] ; Anorexia [ ] ; Fatigue [ ] ; Fever [ ] ; Chills [ ] ; Weakness [ ]   Cardiac: Chest pain/pressure [ ] ; Resting SOB [ ] ; Exertional SOB [ ] ; Orthopnea [ ] ; Pedal Edema [ ] ; Palpitations [ ] ; Syncope [ ] ; Presyncope [ ] ; Paroxysmal nocturnal dyspnea[ ]   Pulmonary: Cough [ ] ; Wheezing[ ] ; Hemoptysis[ ] ; Sputum [ ] ; Snoring [ ]   GI: Vomiting[ ] ; Dysphagia[  ]; Melena[ ] ; Hematochezia [ ] ; Heartburn[ ] ; Abdominal pain [ ] ; Constipation [ ] ; Diarrhea [ ] ; BRBPR [ ]   GU: Hematuria[ ] ; Dysuria [ ] ; Nocturia[ ]   Vascular: Pain in legs with walking [ ] ; Pain in feet with lying flat [ ] ; Non-healing sores [ ] ; Stroke [ ] ; TIA [ ] ; Slurred speech [ ] ;  Neuro: Headaches[ ] ; Vertigo[ ] ; Seizures[ ] ; Paresthesias[ ] ;Blurred vision [ ] ; Diplopia [ ] ; Vision changes [ ]   Ortho/Skin: Arthritis [ ] ; Joint pain [ ] ; Muscle pain [ ] ; Joint swelling [ ] ; Back Pain [ ] ; Rash [ ]   Psych: Depression[ ] ; Anxiety[ ]   Heme: Bleeding problems [ ] ; Clotting disorders [ ] ; Anemia [ ]   Endocrine: Diabetes [ ] ; Thyroid dysfunction[ ]    Past Medical History  Diagnosis Date  . MS (multiple sclerosis)     a. Dx'd late 20's. b. Tx with Novantrone, Tysabri, Copaxone previously.  Marland Kitchen Hypertension   . Depression   . Glaucoma(365)   . Cardiomyopathy     a.  Echo 04/29/12: Mild LVH, EF 20-25%, mild AI, moderate MR, moderate LAE, mild RAE, mild RVE, moderate TR, PASP 51, small pericardial effusion;   b. probably non-ischemic given multiple chemo-Tx agents used for MS and global LV dysfn on echo  . Chronic systolic heart failure     Current Outpatient Prescriptions  Medication Sig Dispense Refill  . amantadine (  SYMMETREL) 100 MG capsule Take 100 mg by mouth 2 (two) times daily.       Marland Kitchen aspirin EC 81 MG EC tablet Take 1 tablet (81 mg total) by mouth daily.  30 tablet  1  . carvedilol (COREG) 3.125 MG tablet Take 1 tablet (3.125 mg total) by mouth 2 (two) times daily with a meal.  60 tablet  1  . Cholecalciferol (VITAMIN D3 PO) Take 1 tablet by mouth daily.        . Cyanocobalamin (VITAMIN B12 PO) Take 1 tablet by mouth daily.        . Dimethyl Fumarate (TECFIDERA) 240 MG CPDR Take 1 capsule by mouth 2 (two) times daily.      . furosemide (LASIX) 40 MG tablet Take 1.5 tablets (60 mg total) by mouth 2 (two) times daily.  90 tablet  11  . gabapentin (NEURONTIN) 600 MG tablet Take 600  mg by mouth 3 (three) times daily.       Marland Kitchen imipramine (TOFRANIL) 50 MG tablet Take 100 mg by mouth at bedtime.        Marland Kitchen lisinopril (PRINIVIL,ZESTRIL) 5 MG tablet Take 1 tablet (5 mg total) by mouth daily.  30 tablet  1  . Multiple Vitamin (MULTIVITAMIN WITH MINERALS) TABS Take 1 tablet by mouth daily.      . potassium chloride SA (K-DUR,KLOR-CON) 20 MEQ tablet Take 1 tablet (20 mEq total) by mouth daily.  30 tablet  11  . solifenacin (VESICARE) 10 MG tablet Take 5 mg by mouth 2 (two) times daily.        Marland Kitchen spironolactone (ALDACTONE) 25 MG tablet Take 1 tablet (25 mg total) by mouth daily.  30 tablet  11  . Travoprost, BAK Free, (TRAVATAN) 0.004 % SOLN ophthalmic solution Place 1 drop into both eyes at bedtime.       Marland Kitchen zolpidem (AMBIEN) 5 MG tablet Take 5 mg by mouth at bedtime.         Allergies  Allergen Reactions  . Sulfonamide Derivatives Shortness Of Breath  . Penicillins Hives and Itching    History   Social History  . Marital Status: Single    Spouse Name: N/A    Number of Children: N/A  . Years of Education: N/A   Occupational History  . Not on file.   Social History Main Topics  . Smoking status: Never Smoker   . Smokeless tobacco: Never Used  . Alcohol Use: No  . Drug Use: No  . Sexually Active:    Other Topics Concern  . Not on file   Social History Narrative  . No narrative on file    Family History  Problem Relation Age of Onset  . Hypertension Mother     PHYSICAL EXAM: Filed Vitals:   05/22/12 1139  BP: 146/108  Pulse: 94  Weight: 221 lb (100.245 kg)  SpO2: 100%    General:  Well appearing. No respiratory difficulty.  In wheelchair  HEENT: normal Neck: supple. JVP 9 with +HJR. Carotids 2+ bilat; no bruits. No lymphadenopathy or thryomegaly appreciated. Cor: PMI nondisplaced. Regular rate & rhythm. No rubs, gallops or murmurs. Lungs: clear Abdomen: soft, nontender, nondistended. No hepatosplenomegaly. No bruits or masses. Good bowel  sounds. Extremities: no cyanosis, clubbing, rash, 1+ edema on left Neuro: alert & oriented x 3, cranial nerves grossly intact. moves all 4 extremities w/o difficulty. Affect pleasant.   ASSESSMENT & PLAN:

## 2012-05-22 NOTE — Telephone Encounter (Signed)
Hi Scott,  See message below from [facility]. Let me know what you want to do.  Thank you Jene Every 05/22/2012 3:46 PM Signed  Selena Batten home health nurse, needs additional orders to see pt another 3 weeks once a week, Also pt was to have bmet tomorrow but was cxl if needs to be done if she can get order she can draw when/if due

## 2012-05-22 NOTE — Patient Instructions (Addendum)
Increase lisinopril 5 mg twice daily.  Increase lasix 80 mg twice daily.  Follow up 2 weeks.  Labs today.

## 2012-05-22 NOTE — Assessment & Plan Note (Signed)
As above, increase lisinopril 5 mg BID.

## 2012-05-22 NOTE — Telephone Encounter (Signed)
She is now seen in CHF clinic - seen today for 1st visit. They checked labs. Probably need to have them renew [facility] and manage blood draws. Tereso Newcomer, PA-C  5:44 PM 05/22/2012

## 2012-05-22 NOTE — Telephone Encounter (Signed)
Kim home health nurse, needs additional orders to see pt another 3 weeks once a week,  Also pt was to have bmet tomorrow but was cxl if needs to be done if she can get order she can draw when/if due

## 2012-05-22 NOTE — Assessment & Plan Note (Addendum)
Have explained the role of the heart failure clinic in full.  NYHA III.  Volume status elevated will increase lasix 80 mg BID, have discussed sliding scale lasix and she will begin weighing once her scale arrives.  Will also increase afterload reduction with lisinopril 5 mg BID.  Labs today.  Will defer ischemic work up at this time unless she has ischemic symptoms. Follow up 2 weeks for further med titration.

## 2012-05-23 ENCOUNTER — Other Ambulatory Visit: Payer: PRIVATE HEALTH INSURANCE

## 2012-05-23 NOTE — Telephone Encounter (Signed)
Left message for Kim to call back.  

## 2012-05-23 NOTE — Telephone Encounter (Signed)
Hi Heather,  See message below from [place] in reference to message from [facility]. Let me know if you need anything else.

## 2012-05-27 MED ORDER — POTASSIUM CHLORIDE CRYS ER 20 MEQ PO TBCR: 40.0000 meq | EXTENDED_RELEASE_TABLET | Freq: Every day | ORAL | Status: DC

## 2012-05-27 NOTE — Telephone Encounter (Signed)
Spoke w/Kim gave verbal to continue visits, also she will advise pt to increase KCL to 40 and will recheck bmet next week

## 2012-05-28 ENCOUNTER — Other Ambulatory Visit: Payer: PRIVATE HEALTH INSURANCE

## 2012-05-30 ENCOUNTER — Other Ambulatory Visit: Payer: PRIVATE HEALTH INSURANCE

## 2012-06-02 ENCOUNTER — Ambulatory Visit: Payer: PRIVATE HEALTH INSURANCE | Admitting: Physician Assistant

## 2012-06-03 ENCOUNTER — Encounter: Payer: Self-pay | Admitting: Cardiovascular Disease

## 2012-06-04 ENCOUNTER — Other Ambulatory Visit: Payer: Self-pay

## 2012-06-05 ENCOUNTER — Ambulatory Visit (HOSPITAL_COMMUNITY)
Admission: RE | Admit: 2012-06-05 | Discharge: 2012-06-05 | Disposition: A | Discharge status: Home / Self Care | Payer: PRIVATE HEALTH INSURANCE | Source: Ambulatory Visit | Attending: Internal Medicine | Admitting: Internal Medicine

## 2012-06-05 VITALS — BP 142/68 | HR 99 | Wt 200.0 lb

## 2012-06-05 DIAGNOSIS — I5022 Chronic systolic (congestive) heart failure: Secondary | ICD-10-CM | POA: Insufficient documentation

## 2012-06-05 DIAGNOSIS — I10 Essential (primary) hypertension: Secondary | ICD-10-CM

## 2012-06-05 MED ORDER — CARVEDILOL 3.125 MG PO TABS: 6.2500 mg | ORAL_TABLET | Freq: Two times a day (BID) | ORAL | Status: DC

## 2012-06-05 NOTE — Progress Notes (Signed)
Referring Physician: Lorin Picket , PA at [facility] Primary Care: Dr. Jeri Cos  Primary Cardiologist: Dr Tonny Bollman  Weight Range: Baseline proBNP:  HPI: Ms. Garretson is a 51 y.o. female with history of multiple sclerosis (diagnosed in late 50s).  She has been treated with Novantrone, Tysabri, Copaxone, Symmetrel and Dimethyl Fumerate for her MS.  She is scooter-bound with long distances but can ambulate short distances around the house.  She also has systolic heart failure, EF 25% no prior ischemic work up.  She has hypertension and depression as well.      Admitted 10/28-10/31 to [facility] for a two-week history of progressively worsening shortness of breath, LE edema, orthopnea and decreased urine output. CT was negative for pulmonary embolism. Findings were suggestive of pulmonary edema. Echo 04/29/12: Mild LVH, EF 20-25%, mild AI, moderate MR, moderate LAE, mild RAE, mild RVE, moderate TR, PASP 51, small pericardial effusion.  She was diuresed and discharged home.   She returns for follow up today with her caregiver.  She feels pretty good.  She has just gotten her scale in the mail and will set it up today or tomorrow.  She is trying to watch her fluid intake as well as her sodium intake.  Her caregiver prepares her meals.  She has noted improvement in her edema with increased lasix 80 mg BID at last visit.  She denies orthopnea/PND.  No chest pain.      Review of Systems: All pertinent positives and negatives as in HPI, otherwise negative.   Past Medical History  Diagnosis Date  . MS (multiple sclerosis)     a. Dx'd late 20's. b. Tx with Novantrone, Tysabri, Copaxone previously.  Marland Kitchen Hypertension   . Depression   . Glaucoma(365)   . Cardiomyopathy     a.  Echo 04/29/12: Mild LVH, EF 20-25%, mild AI, moderate MR, moderate LAE, mild RAE, mild RVE, moderate TR, PASP 51, small pericardial effusion;   b. probably non-ischemic given multiple chemo-Tx agents used for MS and global LV dysfn on echo   . Chronic systolic heart failure     Current Outpatient Prescriptions  Medication Sig Dispense Refill  . amantadine (SYMMETREL) 100 MG capsule Take 100 mg by mouth 2 (two) times daily.       Marland Kitchen aspirin EC 81 MG EC tablet Take 1 tablet (81 mg total) by mouth daily.  30 tablet  1  . carvedilol (COREG) 3.125 MG tablet Take 1 tablet (3.125 mg total) by mouth 2 (two) times daily with a meal.  60 tablet  1  . Cholecalciferol (VITAMIN D3 PO) Take 1 tablet by mouth daily.        . Cyanocobalamin (VITAMIN B12 PO) Take 1 tablet by mouth daily.        . Dimethyl Fumarate (TECFIDERA) 240 MG CPDR Take 1 capsule by mouth 2 (two) times daily.      . furosemide (LASIX) 40 MG tablet Take 2 tablets (80 mg total) by mouth 2 (two) times daily.  90 tablet  11  . gabapentin (NEURONTIN) 600 MG tablet Take 600 mg by mouth 3 (three) times daily.       Marland Kitchen imipramine (TOFRANIL) 50 MG tablet Take 100 mg by mouth at bedtime.        Marland Kitchen lisinopril (PRINIVIL,ZESTRIL) 5 MG tablet Take 1 tablet (5 mg total) by mouth 2 (two) times daily.  30 tablet  1  . Multiple Vitamin (MULTIVITAMIN WITH MINERALS) TABS Take 1 tablet by mouth daily.      Marland Kitchen  potassium chloride SA (K-DUR,KLOR-CON) 20 MEQ tablet Take 3 tablets (60 mEq total) by mouth daily.  1 tablet  0  . solifenacin (VESICARE) 10 MG tablet Take 5 mg by mouth 2 (two) times daily.        Marland Kitchen spironolactone (ALDACTONE) 25 MG tablet Take 1 tablet (25 mg total) by mouth daily.  30 tablet  11  . Travoprost, BAK Free, (TRAVATAN) 0.004 % SOLN ophthalmic solution Place 1 drop into both eyes at bedtime.       Marland Kitchen zolpidem (AMBIEN) 5 MG tablet Take 5 mg by mouth at bedtime.         Allergies  Allergen Reactions  . Sulfonamide Derivatives Shortness Of Breath  . Penicillins Hives and Itching     PHYSICAL EXAM: Filed Vitals:   06/05/12 1229  BP: 142/68  Pulse: 99  Weight: 200 lb (90.719 kg)  SpO2: 100%    General:  Well appearing. No respiratory difficulty.  In wheelchair  HEENT:  normal Neck: supple. JVP 9 with +HJR. Carotids 2+ bilat; no bruits. No lymphadenopathy or thryomegaly appreciated. Cor: PMI nondisplaced. Regular rate & rhythm. No rubs, gallops or murmurs. Lungs: clear Abdomen: soft, nontender, nondistended. No hepatosplenomegaly. No bruits or masses. Good bowel sounds. Extremities: no cyanosis, clubbing, rash, trace-1+ edema on left Neuro: alert & oriented x 3, cranial nerves grossly intact. moves all 4 extremities w/o difficulty. Affect pleasant.   ASSESSMENT & PLAN:

## 2012-06-05 NOTE — Patient Instructions (Addendum)
Increase carvedilol 2 tabs (6.25 mg) twice daily.  Continue potassium 3 tablets daily.    Follow up 1 month.

## 2012-06-06 ENCOUNTER — Encounter (HOSPITAL_COMMUNITY): Payer: Self-pay | Admitting: *Deleted

## 2012-06-06 ENCOUNTER — Emergency Department (HOSPITAL_COMMUNITY): Payer: PRIVATE HEALTH INSURANCE

## 2012-06-06 ENCOUNTER — Emergency Department (HOSPITAL_COMMUNITY)
Admission: EM | Admit: 2012-06-06 | Discharge: 2012-06-07 | Disposition: A | Discharge status: Home / Self Care | Payer: PRIVATE HEALTH INSURANCE | Attending: Dermatology | Admitting: Dermatology

## 2012-06-06 DIAGNOSIS — F3289 Other specified depressive episodes: Secondary | ICD-10-CM | POA: Insufficient documentation

## 2012-06-06 DIAGNOSIS — R51 Headache: Secondary | ICD-10-CM | POA: Insufficient documentation

## 2012-06-06 DIAGNOSIS — F329 Major depressive disorder, single episode, unspecified: Secondary | ICD-10-CM | POA: Insufficient documentation

## 2012-06-06 DIAGNOSIS — R42 Dizziness and giddiness: Secondary | ICD-10-CM | POA: Insufficient documentation

## 2012-06-06 DIAGNOSIS — Z8679 Personal history of other diseases of the circulatory system: Secondary | ICD-10-CM | POA: Insufficient documentation

## 2012-06-06 DIAGNOSIS — R531 Weakness: Secondary | ICD-10-CM

## 2012-06-06 DIAGNOSIS — E876 Hypokalemia: Secondary | ICD-10-CM | POA: Insufficient documentation

## 2012-06-06 DIAGNOSIS — Z79899 Other long term (current) drug therapy: Secondary | ICD-10-CM | POA: Insufficient documentation

## 2012-06-06 DIAGNOSIS — I10 Essential (primary) hypertension: Secondary | ICD-10-CM | POA: Insufficient documentation

## 2012-06-06 DIAGNOSIS — G35 Multiple sclerosis: Secondary | ICD-10-CM | POA: Insufficient documentation

## 2012-06-06 DIAGNOSIS — R5381 Other malaise: Secondary | ICD-10-CM | POA: Insufficient documentation

## 2012-06-06 DIAGNOSIS — I5022 Chronic systolic (congestive) heart failure: Secondary | ICD-10-CM | POA: Insufficient documentation

## 2012-06-06 LAB — CBC WITH DIFFERENTIAL/PLATELET
Basophils Absolute: 0.1 10*3/uL (ref 0.0–0.1)
Eosinophils Relative: 6 % — ABNORMAL HIGH (ref 0–5)
HCT: 36.8 % (ref 36.0–46.0)
Lymphocytes Relative: 29 % (ref 12–46)
Lymphs Abs: 1.1 10*3/uL (ref 0.7–4.0)
MCV: 77.1 fL — ABNORMAL LOW (ref 78.0–100.0)
Monocytes Relative: 13 % — ABNORMAL HIGH (ref 3–12)
Neutro Abs: 2 10*3/uL (ref 1.7–7.7)
RBC: 4.77 MIL/uL (ref 3.87–5.11)
RDW: 15.7 % — ABNORMAL HIGH (ref 11.5–15.5)
WBC: 3.9 10*3/uL — ABNORMAL LOW (ref 4.0–10.5)

## 2012-06-06 LAB — URINALYSIS, ROUTINE W REFLEX MICROSCOPIC
Glucose, UA: NEGATIVE mg/dL
Hgb urine dipstick: NEGATIVE
Ketones, ur: NEGATIVE mg/dL
Protein, ur: NEGATIVE mg/dL
Urobilinogen, UA: 0.2 mg/dL (ref 0.0–1.0)

## 2012-06-06 LAB — COMPREHENSIVE METABOLIC PANEL
AST: 24 U/L (ref 0–37)
Albumin: 3.8 g/dL (ref 3.5–5.2)
Alkaline Phosphatase: 105 U/L (ref 39–117)
BUN: 14 mg/dL (ref 6–23)
CO2: 28 mEq/L (ref 19–32)
Chloride: 100 mEq/L (ref 96–112)
Creatinine, Ser: 0.92 mg/dL (ref 0.50–1.10)
GFR calc non Af Amer: 72 mL/min — ABNORMAL LOW (ref >90)
Potassium: 3.1 mEq/L — ABNORMAL LOW (ref 3.5–5.1)
Total Bilirubin: 0.4 mg/dL (ref 0.3–1.2)

## 2012-06-06 MED ORDER — POTASSIUM CHLORIDE CRYS ER 20 MEQ PO TBCR
40.0000 meq | EXTENDED_RELEASE_TABLET | Freq: Once | ORAL | Status: AC
  Administered 2012-06-07: 40 meq via ORAL
  Filled 2012-06-06: qty 2

## 2012-06-06 MED ORDER — SODIUM CHLORIDE 0.9 % IV BOLUS (SEPSIS): 250.0000 mL | Freq: Once | INTRAVENOUS | Status: DC

## 2012-06-06 NOTE — ED Notes (Signed)
Patient transported to CT 

## 2012-06-06 NOTE — ED Notes (Signed)
ZOX:WRUE<AV> Expected date:06/06/12<BR> Expected time: 5:24 PM<BR> Means of arrival:Ambulance<BR> Comments:<BR> ems

## 2012-06-06 NOTE — ED Notes (Addendum)
Pt arrives by [facility] c/o generalized weakness. States pt has not been able to walk today. Pt has hx of MS.

## 2012-06-06 NOTE — ED Provider Notes (Signed)
History     CSN: [id number]  Arrival date & time 06/06/12  1727   First MD Initiated Contact with Patient 06/06/12 2126      Chief Complaint  Patient presents with  . Weakness    (Consider location/radiation/quality/duration/timing/severity/associated sxs/prior treatment) Patient is a 51 y.o. female presenting with weakness. The history is provided by the patient.  Weakness The primary symptoms include headaches and dizziness. Primary symptoms do not include fever, nausea or vomiting. The symptoms began 6 to 12 hours ago. The symptoms are improving.  The headache is associated with weakness.  Dizziness also occurs with weakness. Dizziness does not occur with nausea or vomiting.   Additional symptoms include weakness. Associated symptoms comments: She presents for evaluation of weakness that started today. She stood after sitting in her chair, felt dizzy and weak and fell. She is unsure if she passed out or not, but states she hit her head and currently has a headache. No nausea or vomiting. She was admitted with new diagnosis of CHF in October of this year with follow up approximately one week ago during which her Lasix dose was increased from 40 mg BID to 80 mg BID. She reports she had been having SOB and extremity edema prior to increasing her Lasix, both of which have resolved over the week. She went to [employer] today and states it is the first time she has been that active since her hospitalization. It was after she returned home and got out of her mobilization chair and stood that she became weak. She denies chest pain, SOB, abdominal pain.. Associated medical issues comments: She has a history of MS and uses a mobilization chair to get around. .    Past Medical History  Diagnosis Date  . MS (multiple sclerosis)     a. Dx'd late 20's. b. Tx with Novantrone, Tysabri, Copaxone previously.  Marland Kitchen Hypertension   . Depression   . Glaucoma(365)   . Cardiomyopathy     a.  Echo  04/29/12: Mild LVH, EF 20-25%, mild AI, moderate MR, moderate LAE, mild RAE, mild RVE, moderate TR, PASP 51, small pericardial effusion;   b. probably non-ischemic given multiple chemo-Tx agents used for MS and global LV dysfn on echo  . Chronic systolic heart failure     Past Surgical History  Procedure Date  . Ablation     uterine  . Cesarean section     Family History  Problem Relation Age of Onset  . Hypertension Mother     History  Substance Use Topics  . Smoking status: Never Smoker   . Smokeless tobacco: Never Used  . Alcohol Use: No    OB History    Grav Para Term Preterm Abortions TAB SAB Ect Mult Living                  Review of Systems  Constitutional: Positive for fatigue. Negative for fever and chills.  HENT: Negative for neck pain.   Eyes: Negative for visual disturbance.  Respiratory: Negative for shortness of breath.   Cardiovascular: Negative for chest pain.  Gastrointestinal: Negative for nausea, vomiting and abdominal pain.  Genitourinary: Negative for dysuria.  Musculoskeletal: Negative for back pain.  Neurological: Positive for dizziness, weakness and headaches.       See HPI.  Psychiatric/Behavioral: Negative for confusion.    Allergies  Sulfonamide derivatives and Penicillins  Home Medications   Current Outpatient Rx  Name  Route  Sig  Dispense  Refill  .  AMANTADINE HCL 100 MG PO CAPS   Oral   Take 100 mg by mouth 2 (two) times daily.          Marland Kitchen CARVEDILOL 3.125 MG PO TABS   Oral   Take 2 tablets (6.25 mg total) by mouth 2 (two) times daily with a meal.   60 tablet   1   . VITAMIN D3 PO   Oral   Take 1 tablet by mouth daily.           Marland Kitchen VITAMIN B12 PO   Oral   Take 1 tablet by mouth daily.           Marland Kitchen DIMETHYL FUMARATE 240 MG PO CPDR   Oral   Take 1 capsule by mouth 2 (two) times daily.         . FUROSEMIDE 40 MG PO TABS   Oral   Take 80 mg by mouth 2 (two) times daily.         Marland Kitchen GABAPENTIN 600 MG PO TABS    Oral   Take 600 mg by mouth 3 (three) times daily.          . IMIPRAMINE HCL 50 MG PO TABS   Oral   Take 100 mg by mouth at bedtime.           . ADULT MULTIVITAMIN W/MINERALS CH   Oral   Take 1 tablet by mouth daily.         Marland Kitchen POTASSIUM CHLORIDE CRYS ER 20 MEQ PO TBCR   Oral   Take 40 mEq by mouth daily.    1 tablet   0   . SOLIFENACIN SUCCINATE 10 MG PO TABS   Oral   Take 5 mg by mouth 2 (two) times daily.           Marland Kitchen SPIRONOLACTONE 25 MG PO TABS   Oral   Take 1 tablet (25 mg total) by mouth daily.   30 tablet   11   . TRAVOPROST (BAK FREE) 0.004 % OP SOLN   Both Eyes   Place 1 drop into both eyes at bedtime.          Marland Kitchen ZOLPIDEM TARTRATE 5 MG PO TABS   Oral   Take 5 mg by mouth at bedtime.            BP 161/85  Pulse 105  Temp 97.6 F (36.4 C) (Oral)  Resp 16  SpO2 100%  Physical Exam  Constitutional: She appears well-developed and well-nourished. No distress.  HENT:  Head: Normocephalic.  Neck: Normal range of motion. Neck supple.  Cardiovascular: Regular rhythm.  Tachycardia present.   No murmur heard. Pulmonary/Chest: Effort normal and breath sounds normal. She has no rales.  Abdominal: Soft. Bowel sounds are normal. There is no tenderness. There is no rebound and no guarding.  Musculoskeletal: Normal range of motion.  Neurological: She is alert. No cranial nerve deficit.  Skin: Skin is warm and dry. No rash noted.  Psychiatric: She has a normal mood and affect.    ED Course  Procedures (including critical care time)  Labs Reviewed  CBC WITH DIFFERENTIAL - Abnormal; Notable for the following:    WBC 3.9 (*)     MCV 77.1 (*)     RDW 15.7 (*)     Monocytes Relative 13 (*)     Eosinophils Relative 6 (*)     Basophils Relative 3 (*)     All other components within normal  limits  COMPREHENSIVE METABOLIC PANEL - Abnormal; Notable for the following:    Potassium 3.1 (*)     GFR calc non Af Amer 72 (*)     GFR calc Af Amer 83 (*)      All other components within normal limits  GLUCOSE, CAPILLARY - Abnormal; Notable for the following:    Glucose-Capillary 108 (*)     All other components within normal limits  URINALYSIS, ROUTINE W REFLEX MICROSCOPIC   Results for orders placed during the hospital encounter of 06/06/12  CBC WITH DIFFERENTIAL      Component Value Range   WBC 3.9 (*) 4.0 - 10.5 K/uL   RBC 4.77  3.87 - 5.11 MIL/uL   Hemoglobin 13.1  12.0 - 15.0 g/dL   HCT 30.8  65.7 - 84.6 %   MCV 77.1 (*) 78.0 - 100.0 fL   MCH 27.5  26.0 - 34.0 pg   MCHC 35.6  30.0 - 36.0 g/dL   RDW 96.2 (*) 95.2 - 84.1 %   Platelets 199  150 - 400 K/uL   Neutrophils Relative 49  43 - 77 %   Lymphocytes Relative 29  12 - 46 %   Monocytes Relative 13 (*) 3 - 12 %   Eosinophils Relative 6 (*) 0 - 5 %   Basophils Relative 3 (*) 0 - 1 %   Neutro Abs 2.0  1.7 - 7.7 K/uL   Lymphs Abs 1.1  0.7 - 4.0 K/uL   Monocytes Absolute 0.5  0.1 - 1.0 K/uL   Eosinophils Absolute 0.2  0.0 - 0.7 K/uL   Basophils Absolute 0.1  0.0 - 0.1 K/uL   Smear Review MORPHOLOGY UNREMARKABLE    COMPREHENSIVE METABOLIC PANEL      Component Value Range   Sodium 141  135 - 145 mEq/L   Potassium 3.1 (*) 3.5 - 5.1 mEq/L   Chloride 100  96 - 112 mEq/L   CO2 28  19 - 32 mEq/L   Glucose, Bld 97  70 - 99 mg/dL   BUN 14  6 - 23 mg/dL   Creatinine, Ser 3.24  0.50 - 1.10 mg/dL   Calcium 9.7  8.4 - 40.1 mg/dL   Total Protein 7.5  6.0 - 8.3 g/dL   Albumin 3.8  3.5 - 5.2 g/dL   AST 24  0 - 37 U/L   ALT 22  0 - 35 U/L   Alkaline Phosphatase 105  39 - 117 U/L   Total Bilirubin 0.4  0.3 - 1.2 mg/dL   GFR calc non Af Amer 72 (*) >90 mL/min   GFR calc Af Amer 83 (*) >90 mL/min  URINALYSIS, ROUTINE W REFLEX MICROSCOPIC      Component Value Range   Color, Urine YELLOW  YELLOW   APPearance CLEAR  CLEAR   Specific Gravity, Urine 1.011  1.005 - 1.030   pH 7.0  5.0 - 8.0   Glucose, UA NEGATIVE  NEGATIVE mg/dL   Hgb urine dipstick NEGATIVE  NEGATIVE   Bilirubin Urine  NEGATIVE  NEGATIVE   Ketones, ur NEGATIVE  NEGATIVE mg/dL   Protein, ur NEGATIVE  NEGATIVE mg/dL   Urobilinogen, UA 0.2  0.0 - 1.0 mg/dL   Nitrite NEGATIVE  NEGATIVE   Leukocytes, UA NEGATIVE  NEGATIVE  GLUCOSE, CAPILLARY      Component Value Range   Glucose-Capillary 108 (*) 70 - 99 mg/dL   Dg Chest 2 View  08/08/2534  *RADIOLOGY REPORT*  Clinical Data: Weakness,  hypertension.  CHEST - 2 VIEW  Comparison: 04/28/2012.  Chest CT 06/29/2012.  Findings: Heart is borderline enlarged.  This has improved since prior study.  No pulmonary edema or effusions.  Nodular density projects over the right mid lung.  No pulmonary nodules were seen on recent chest CT.  No confluent airspace opacities otherwise.  No acute bony abnormality.  IMPRESSION: Cardiomegaly, improved since prior study.  Rounded nodular density projects over the right mid lung.  No pulmonary nodule was noted on prior chest CT.  This could represent overlapping shadows or external artifact given the short term since prior study.  Confirmation that this is not a pulmonary nodule could be performed with chest CT if felt clinically indicated.   Original Report Authenticated By: Charlett Nose, M.D.    Ct Head Wo Contrast  06/06/2012  *RADIOLOGY REPORT*  Clinical Data: Generalized weakness.  History of multiple sclerosis.  CT HEAD WITHOUT CONTRAST  Technique:  Contiguous axial images were obtained from the base of the skull through the vertex without contrast.  Comparison: 06/01/2011.  Findings: No significant change in diffuse enlargement of the ventricles and subarachnoid spaces.  No significant change in patchy white matter low density in both cerebral hemispheres.  No intracranial hemorrhage, mass lesion or CT evidence of acute infarction.  Unremarkable bones and included paranasal sinuses.  IMPRESSION:  1.  No acute abnormality. 2.  Stable atrophy. 3.  Stable chronic small vessel white matter ischemic changes and/or changes due to the patients known  multiple sclerosis.   Original Report Authenticated By: Beckie Salts, M.D.    Date: 06/07/2012  Rate: 96  Rhythm: normal sinus rhythm  QRS Axis: normal  Intervals: normal  ST/T Wave abnormalities: normal  Conduction Disutrbances:nonspecific intraventricular conduction delay  Narrative Interpretation:   Old EKG Reviewed: unchanged   No results found.   No diagnosis found.  1. Weakness 2. H/o MS 3. Hypokalemia   MDM  The patient is feeling better over time. She reports increasing right sided weakness associated with MS but that she is no worse than usual. She ambulated with assistance and is per usual according to patient. She is comfortable returning home. Symptoms felt to be multifactorial including over-exertion today, h/o MS and recent increase in Lasix as possibly contributing, however, she feels improved, is ambulatory and reports she wants to return home.        Rodena Medin, PA-C 06/07/12 0015  Rodena Medin, PA-C 06/07/12 0020

## 2012-06-07 MED ORDER — WHITE PETROLATUM GEL
Status: AC
  Administered 2012-06-07: 1
  Filled 2012-06-07: qty 5

## 2012-06-07 NOTE — ED Notes (Signed)
Pt also reports frequent falls at home at night when she is by herself, pt noted to be tearful at times when speaking of her situation.

## 2012-06-07 NOTE — ED Provider Notes (Signed)
Received signout on this patient from Langley Adie, PA-C.  Patient was discharged last night, but was unable to enter her home.  She apparently was not able to access her key.  I will get in touch with social work later this morning.  7:55 AM Patient states that the woman that she lives with, leaves for work at around 7:00. This means that the screen door will be unlocked, and she will be able to access the lock box and enter her home. She is stable and ready for discharge, and will be transported home again.  Roxy Horseman, PA-C 06/07/12 [phone]

## 2012-06-07 NOTE — ED Provider Notes (Signed)
Pt has access back again to her house. She is ready to go home. She does not need to speak with social work. Stable for discharge  Shannon Co, MD 06/07/12 [phone]

## 2012-06-07 NOTE — ED Provider Notes (Signed)
Medical screening examination/treatment/procedure(s) were conducted as a shared visit with non-physician practitioner(s) and myself.  I personally evaluated the patient during the encounter  Toy Baker, MD 06/07/12 [phone]

## 2012-06-07 NOTE — ED Notes (Signed)
Patient is alert and oriented x3.  She was given DC instructions and follow up visit instructions.  Patient gave verbal understanding. She was DC via [facility] to home.  V/S stable.  He was not showing any signs of distress on DC

## 2012-06-07 NOTE — ED Notes (Signed)
Per [facility], unable to get into pt's home after DC from ED, no one answered the door or phone after several attempts so pt brought back to ED. Per Janine Limbo, Charge RN, social work consult to be ordered and pt to be discharge-readmit which was done.

## 2012-06-07 NOTE — ED Notes (Signed)
[facility] called to transport pt back home awaiting arrival

## 2012-06-07 NOTE — ED Notes (Signed)
Called [facility] and stated they were running behind pt is still on list for transport home.

## 2012-06-07 NOTE — ED Notes (Signed)
[facility] called to pick pt up, pt reports that person staying with her at night will be up getting ready to go to work and that she will be able to get in her house, Vance Gather, oncoming RN aware.

## 2012-06-09 ENCOUNTER — Other Ambulatory Visit: Payer: PRIVATE HEALTH INSURANCE

## 2012-06-09 NOTE — Assessment & Plan Note (Signed)
Increase carvedilol 6.25 mg BID.

## 2012-06-09 NOTE — Assessment & Plan Note (Signed)
Weight improved with increased lasix, but I do not feel she has improved 20 pounds.  She will start weighing at home once she sets up her scale today.  Will continue current diuretics.  Hypokalemia on last week labs, continue increased potassium 60 meq daily.  Follow up labs scheduled for end of week.  Will follow.  Titrate carvedilol 6.25 mg BID.  Follow closely in HF clinic for fluid management and further med titration.

## 2012-06-10 ENCOUNTER — Ambulatory Visit (INDEPENDENT_AMBULATORY_CARE_PROVIDER_SITE_OTHER): Payer: PRIVATE HEALTH INSURANCE | Admitting: Physician Assistant

## 2012-06-10 ENCOUNTER — Encounter: Payer: Self-pay | Admitting: Physician Assistant

## 2012-06-10 VITALS — BP 122/88 | HR 106 | Ht 65.0 in | Wt 200.0 lb

## 2012-06-10 DIAGNOSIS — I10 Essential (primary) hypertension: Secondary | ICD-10-CM

## 2012-06-10 DIAGNOSIS — I5022 Chronic systolic (congestive) heart failure: Secondary | ICD-10-CM

## 2012-06-10 DIAGNOSIS — N319 Neuromuscular dysfunction of bladder, unspecified: Secondary | ICD-10-CM

## 2012-06-10 DIAGNOSIS — G35D Multiple sclerosis, unspecified: Secondary | ICD-10-CM

## 2012-06-10 DIAGNOSIS — I428 Other cardiomyopathies: Secondary | ICD-10-CM

## 2012-06-10 DIAGNOSIS — R609 Edema, unspecified: Secondary | ICD-10-CM

## 2012-06-10 DIAGNOSIS — G35 Multiple sclerosis: Secondary | ICD-10-CM

## 2012-06-10 DIAGNOSIS — Z79899 Other long term (current) drug therapy: Secondary | ICD-10-CM

## 2012-06-10 LAB — BASIC METABOLIC PANEL
BUN: 14 mg/dL (ref 6–23)
CO2: 32 mEq/L (ref 19–32)
Calcium: 9.3 mg/dL (ref 8.4–10.5)
Chloride: 100 mEq/L (ref 96–112)
Creatinine, Ser: 0.9 mg/dL (ref 0.4–1.2)
Glucose, Bld: 120 mg/dL — ABNORMAL HIGH (ref 70–99)

## 2012-06-10 MED ORDER — FUROSEMIDE 40 MG PO TABS: ORAL_TABLET | ORAL | Status: DC

## 2012-06-10 NOTE — ED Provider Notes (Signed)
Medical screening examination/treatment/procedure(s) were conducted as a shared visit with non-physician practitioner(s) and myself.  I personally evaluated the patient during the encounter Pt with hx ms, felt weak, mildly lightheaded on standing earlier. A/o x 3. Chest cta. Rrr. abd soft nt. Labs.   Suzi Roots, MD 06/10/12 [phone]

## 2012-06-10 NOTE — Patient Instructions (Addendum)
Your physician recommends that you schedule a follow-up appointment in:  3 - 4 MONTHS WITH DR COOPER KEEP APPT WITH CHF  Your physician has recommended you make the following change in your medication: DECREASE FUROSEMIDE TO 80 MG EVERY AM AND 40 MG EVERY PM Your physician recommends that you return for lab work in: TODAY  BMET  AND IN 1 WEEK  DX V58.69  Your physician recommends that you weigh, daily, at the same time every day, and in the same amount of clothing. Please record your daily weights on the handout provided and bring it to your next appointment.    IF UP 3 LB IN 1 DAY OR 5 LB IN WEEK CALL OFFICE FOLLOW UP WITH UROLOGISTS

## 2012-06-10 NOTE — Progress Notes (Signed)
[address], [address] [place], [place]  [address] Phone: [phone], Fax:  [phone]  Date:  06/10/2012   Name:  Shannon Garrett   DOB:  06-11-1961   MRN:  [id number]  PCP:  Jeri Cos, MD  Primary Cardiologist:  Dr. Tonny Bollman  Primary Electrophysiologist:  None    History of Present Illness: Shannon Garrett is a 51 y.o. female returns for followup.  She has a hx of multiple sclerosis, HTN and depression. She is limited by her multiple sclerosis and is "scooter-bound."  Echo in 6/12 demonstrated normal LVF.  She has been treated with Novantrone, Tysabri, Copaxone, Symmetrel and Dimethyl Fumerate for her MS.  She was admitted 04/2012 with new onset systolic CHF. CT was negative for pulmonary embolism. Findings were suggestive of pulmonary edema. Echo 04/29/12: Mild LVH, EF 20-25%, mild AI, moderate MR, moderate LAE, mild RAE, mild RVE, moderate TR, PASP 51, small pericardial effusion.  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 She has been exposed to multiple chemotherapeutic agents in the past including Novantrone which can cause CHF months to years after discontinuation. Medical therapy recommended for CHF.  Ischemic evaluation was not felt to be necessary.    I saw her in followup 05/15/12. Her volume still appeared to be elevated and I adjusted her diuretics. I reviewed at that time with Dr. Excell Seltzer who recommended medical therapy only. In the meantime, she has been established with the CHF clinic. Her Lasix has been adjusted on one occasion. She's had difficulty with hypokalemia. She is having significant difficulty with frequent urination due to diuretics. She is unable to make it to the bathroom at times. She is quite limited by her MS. Her weight appears to be down 19 pounds. She notes that her breathing is better. Her LE edema is better. She denies syncope. She has occasional atypical chest pains. These are chronic without change.  Labs  (10/13):    K 3.9, creatinine 1.02, ALT 293 => 150, Alb 3, TP 5.8, BNP 10,618, LDL 108, Hgb 10.3, TSH 3.510 Labs (11/13):    K 3.2, creatinine 0.9, Hgb 10.6, ALT 49  Wt Readings from Last 3 Encounters:  06/10/12 200 lb (90.719 kg)  06/05/12 200 lb (90.719 kg)  05/22/12 221 lb (100.245 kg)     Past Medical History  Diagnosis Date  . MS (multiple sclerosis)     a. Dx'd late 20's. b. Tx with Novantrone, Tysabri, Copaxone previously.  Marland Kitchen Hypertension   . Depression   . Glaucoma(365)   . Cardiomyopathy     a.  Echo 04/29/12: Mild LVH, EF 20-25%, mild AI, moderate MR, moderate LAE, mild RAE, mild RVE, moderate TR, PASP 51, small pericardial effusion;   b. probably non-ischemic given multiple chemo-Tx agents used for MS and global LV dysfn on echo  . Chronic systolic heart failure     Current Outpatient Prescriptions  Medication Sig Dispense Refill  . amantadine (SYMMETREL) 100 MG capsule Take 100 mg by mouth 2 (two) times daily.       . carvedilol (COREG) 3.125 MG tablet Take 2 tablets (6.25 mg total) by mouth 2 (two) times daily with a meal.  60 tablet  1  . Cholecalciferol (VITAMIN D3 PO) Take 1 tablet by mouth daily.        . Cyanocobalamin (VITAMIN B12 PO) Take 1 tablet by mouth daily.        . Dimethyl Fumarate (TECFIDERA) 240 MG CPDR Take 1 capsule by  mouth 2 (two) times daily.      . furosemide (LASIX) 40 MG tablet Take 80 mg by mouth 2 (two) times daily.      Marland Kitchen gabapentin (NEURONTIN) 600 MG tablet Take 600 mg by mouth 3 (three) times daily.       Marland Kitchen imipramine (TOFRANIL) 50 MG tablet Take 100 mg by mouth at bedtime.        . Multiple Vitamin (MULTIVITAMIN WITH MINERALS) TABS Take 1 tablet by mouth daily.      . potassium chloride SA (K-DUR,KLOR-CON) 20 MEQ tablet Take 40 mEq by mouth daily.   1 tablet  0  . solifenacin (VESICARE) 10 MG tablet Take 5 mg by mouth 2 (two) times daily.        Marland Kitchen spironolactone (ALDACTONE) 25 MG tablet Take 1 tablet (25 mg total) by mouth daily.  30  tablet  11  . Travoprost, BAK Free, (TRAVATAN) 0.004 % SOLN ophthalmic solution Place 1 drop into both eyes at bedtime.       Marland Kitchen zolpidem (AMBIEN) 5 MG tablet Take 5 mg by mouth at bedtime.         Allergies: Allergies  Allergen Reactions  . Sulfonamide Derivatives Shortness Of Breath  . Penicillins Hives and Itching    Social History:  The patient  reports that she has never smoked. She has never used smokeless tobacco. She reports that she does not drink alcohol or use illicit drugs.   ROS:  Please see the history of present illness.  All other systems reviewed and negative.   PHYSICAL EXAM: VS:  BP 122/88  Pulse 106  Ht 5\' 5"  (1.651 m)  Wt 200 lb (90.719 kg)  BMI 33.28 kg/m2 Well nourished, well developed, in no acute distress HEENT: normal Neck: no JVD at 90 Cardiac:  normal S1, S2; RRR; no murmur Lungs:  clear to auscultation bilaterally, no wheezing, rhonchi or rales Abd: soft, nontender Ext: Very trace bilateral LE edema, L>R Skin: warm and dry Neuro:  CNs 2-12 intact, no focal abnormalities noted  EKG:   sinus tachycardia, HR 106, nonspecific ST-T wave changes, no change from prior tracing   ASSESSMENT AND PLAN:  1. Chronic Systolic CHF:   Her volume appears to be stable. She is significantly debilitated by frequent urination. She is quite frustrated by this. She did not take several of her medications today as well. This likely explains her increased heart rate. We had a long discussion about reducing the dose of her furosemide. I believe she should be able to tolerate this.  I will decrease her furosemide to 80 mg in the morning and 40 mg in the afternoon. Check a basic metabolic panel today. Repeat a basic metabolic panel in one week. She knows that if she develops increased dyspnea, increased edema or weight gain of 3 pounds or more in one day, she should call us.   2. Cardiomyopathy:   Likely nonischemic. Continue beta blocker, ACE inhibitor and spironolactone.    3. Multiple Sclerosis:   Followup with neurology.    4. Neurogenic Bladder:   She has a urologist. I have asked her to followup with her urologist to see if he has any suggestions (i.e. whether or not she is a candidate for indwelling Foley catheter).  5. Disposition:   She is now established with the CHF clinic. I have recommended that she continue followup with the CHF clinic for management and medication titration for her heart failure. She is in agreement with  this. I will bring her back in routine followup with Dr. Excell Seltzer in 3-4 months.  Signed, Tereso Newcomer, PA-C  1:56 PM 06/10/2012

## 2012-06-11 ENCOUNTER — Telehealth: Payer: Self-pay | Admitting: *Deleted

## 2012-06-11 NOTE — Telephone Encounter (Signed)
lmom labs ok and I will call HHRN to have bmet next week

## 2012-06-11 NOTE — Telephone Encounter (Signed)
Message copied by Tarri Fuller on Wed Jun 11, 2012  9:30 AM ------      Message from: [place], [place] T      Created: Tue Jun 10, 2012  4:27 PM       Potassium and kidney function look good.      Continue with current treatment plan.      Should have repeat BMET pending next week (to be drawn by [facility]?)      Tereso Newcomer, PA-C  4:49 PM 05/15/2012

## 2012-06-12 ENCOUNTER — Telehealth: Payer: Self-pay | Admitting: *Deleted

## 2012-06-12 NOTE — Telephone Encounter (Signed)
Message copied by Tarri Fuller on Thu Jun 12, 2012  8:55 AM ------      Message from: [place], [place] T      Created: Tue Jun 10, 2012  4:27 PM       Potassium and kidney function look good.      Continue with current treatment plan.      Should have repeat BMET pending next week (to be drawn by [facility]?)      Tereso Newcomer, PA-C  4:49 PM 05/15/2012

## 2012-06-12 NOTE — Telephone Encounter (Signed)
s/w HHRN and gave v/o for bmet 06/17/12 with results to be faxed to [place], [facility] [phone]. HHRN verbalized understanding for orders given today

## 2012-06-17 ENCOUNTER — Encounter: Payer: Self-pay | Admitting: Cardiovascular Disease

## 2012-07-10 ENCOUNTER — Ambulatory Visit (HOSPITAL_COMMUNITY)
Admission: RE | Admit: 2012-07-10 | Discharge: 2012-07-10 | Disposition: A | Discharge status: Home / Self Care | Payer: PRIVATE HEALTH INSURANCE | Source: Ambulatory Visit | Attending: Internal Medicine | Admitting: Internal Medicine

## 2012-07-10 VITALS — BP 120/70 | HR 93 | Wt 208.0 lb

## 2012-07-10 DIAGNOSIS — R609 Edema, unspecified: Secondary | ICD-10-CM | POA: Insufficient documentation

## 2012-07-10 DIAGNOSIS — I5022 Chronic systolic (congestive) heart failure: Secondary | ICD-10-CM | POA: Insufficient documentation

## 2012-07-10 MED ORDER — FUROSEMIDE 40 MG PO TABS: 40.0000 mg | ORAL_TABLET | Freq: Two times a day (BID) | ORAL | Status: DC

## 2012-07-10 NOTE — Patient Instructions (Addendum)
Follow up in 1 month  Take an extra lasix 40 mg if your weight is 210 pounds or greater.   Do the following things EVERYDAY: 1) Weigh yourself in the morning before breakfast. Write it down and keep it in a log. 2) Take your medicines as prescribed 3) Eat low salt foods-Limit salt (sodium) to 2000 mg per day.  4) Stay as active as you can everyday 5) Limit all fluids for the day to less than 2 liters

## 2012-07-10 NOTE — Assessment & Plan Note (Addendum)
NYHA III. Volume status stable. Will not titrate HF medications due to dizziness. She is unable to tolerate 80 mg of lasix in am due to incontinence. Continue Lasix 40 mg po bid. I have instructed her to take extra 40 mg of lasix if her weight is 210 or greater. She refuses foley catheter. Continue [facility] for HF management. Follow up in 1 month.

## 2012-07-10 NOTE — Progress Notes (Signed)
Patient ID: Shannon Garrett, female   DOB: [birth year], 52 y.o.   MRN: [id number] Referring Physician: Tereso Newcomer, PA at [facility] Primary Care: Dr. Jeri Cos  Primary Cardiologist: Dr Tonny Bollman  Weight Range: 214 pounds  Baseline proBNP:  HPI: Shannon Garrett is a 52 y.o. female with history of multiple sclerosis (diagnosed in late 24s).  She has been treated with Novantrone, Tysabri, Copaxone, Symmetrel and Dimethyl Fumerate for her MS.  She is scooter-bound with long distances but can ambulate short distances around the house.  She also has systolic heart failure, EF 25% no prior ischemic work up.  She has hypertension and depression as well.      Admitted 10/28-10/31 to [facility] for a two-week history of progressively worsening shortness of breath, LE edema, orthopnea and decreased urine output. CT was negative for pulmonary embolism. Findings were suggestive of pulmonary edema. Echo 04/29/12: Mild LVH, EF 20-25%, mild AI, moderate MR, moderate LAE, mild RAE, mild RVE, moderate TR, PASP 51, small pericardial effusion.  She was diuresed and discharged home. Discharge weight 214 pounds.    She returns for follow up today. Last visit carvedilol increased to 6.25 mg twice a day. Denies SOB/PND. + orthopnea. Complains dizziness when standing. Wheelchair bound, but can take a few steps. Only able to weigh 2 times a week because of difficulty standing. Only taking lasix 40 mg twice a day due to incontinence. Does not want foley. Weight at home 205-210. Lives at home alone but has an aide from 10-5:00 daily. After 5 pm she is bed bound. Compliant with medications. Appetite ok. Nurse Aide picks up medications and prepares pills. Followed by [facility].    Review of Systems: All pertinent positives and negatives as in HPI, otherwise negative.   Past Medical History  Diagnosis Date  . MS (multiple sclerosis)     a. Dx'd late 20's. b. Tx with Novantrone, Tysabri, Copaxone previously.  Marland Kitchen Hypertension   .  Depression   . Glaucoma(365)   . Cardiomyopathy     a.  Echo 04/29/12: Mild LVH, EF 20-25%, mild AI, moderate MR, moderate LAE, mild RAE, mild RVE, moderate TR, PASP 51, small pericardial effusion;   b. probably non-ischemic given multiple chemo-Tx agents used for MS and global LV dysfn on echo  . Chronic systolic heart failure     Current Outpatient Prescriptions  Medication Sig Dispense Refill  . amantadine (SYMMETREL) 100 MG capsule Take 100 mg by mouth 2 (two) times daily.       . carvedilol (COREG) 3.125 MG tablet Take 2 tablets (6.25 mg total) by mouth 2 (two) times daily with a meal.  60 tablet  1  . Cholecalciferol (VITAMIN D3 PO) Take 1 tablet by mouth daily.        . Cyanocobalamin (VITAMIN B12 PO) Take 1 tablet by mouth daily.        . Dimethyl Fumarate (TECFIDERA) 240 MG CPDR Take 1 capsule by mouth 2 (two) times daily.      . furosemide (LASIX) 40 MG tablet 80 MG EVERY AM AND 40 MG EVERY PM  30 tablet    . gabapentin (NEURONTIN) 600 MG tablet Take 600 mg by mouth 3 (three) times daily.       Marland Kitchen imipramine (TOFRANIL) 50 MG tablet Take 100 mg by mouth at bedtime.        Marland Kitchen lisinopril (PRINIVIL,ZESTRIL) 5 MG tablet Take 5 mg by mouth daily.      . Multiple Vitamin (MULTIVITAMIN WITH MINERALS)  TABS Take 1 tablet by mouth daily.      . potassium chloride SA (K-DUR,KLOR-CON) 20 MEQ tablet Take 40 mEq by mouth daily.   1 tablet  0  . solifenacin (VESICARE) 10 MG tablet Take 5 mg by mouth 2 (two) times daily.        Marland Kitchen spironolactone (ALDACTONE) 25 MG tablet Take 1 tablet (25 mg total) by mouth daily.  30 tablet  11  . Travoprost, BAK Free, (TRAVATAN) 0.004 % SOLN ophthalmic solution Place 1 drop into both eyes at bedtime.       Marland Kitchen zolpidem (AMBIEN) 5 MG tablet Take 5 mg by mouth at bedtime.         Allergies  Allergen Reactions  . Sulfonamide Derivatives Shortness Of Breath  . Penicillins Hives and Itching     PHYSICAL EXAM: Filed Vitals:   07/10/12 1336  BP: 120/70  Pulse: 93    Weight: 208 lb (94.348 kg)  SpO2: 96%    General:  Chronically ill  appearing. No respiratory difficulty.  In wheelchair  HEENT: normal Neck: supple. JVP 6-7 with +HJR. Carotids 2+ bilat; no bruits. No lymphadenopathy or thryomegaly appreciated. Cor: PMI nondisplaced. Regular rate & rhythm. No rubs, gallops or murmurs. Lungs: clear Abdomen: soft, nontender, nondistended. No hepatosplenomegaly. No bruits or masses. Good bowel sounds. Extremities: no cyanosis, clubbing, rash, trace edema on left Neuro: alert & oriented x 3, cranial nerves grossly intact. moves all 4 extremities w/o difficulty. Affect pleasant.   ASSESSMENT & PLAN:

## 2012-07-15 ENCOUNTER — Telehealth (HOSPITAL_COMMUNITY): Payer: Self-pay | Admitting: Cardiology

## 2012-07-15 NOTE — Telephone Encounter (Signed)
Spoke w/pt she states she is having LE edema, she reports wt was 215 today which is up, she will take an extra lasix & potassium tonight and in the AM and let me know if wt and edema are not getting better, discussed importance of taking meds daily

## 2012-07-15 NOTE — Telephone Encounter (Signed)
[facility] called to report increase in pts weight today 218. Pt was not weighed yesterday. The last time [facility] weighed her was Friday, 07/11/12= 210 Pt does have some L LE edma no SOB. Pt did skip her am meds 07/14/12 as they are still in her pill box per [employer]

## 2012-07-31 ENCOUNTER — Other Ambulatory Visit (HOSPITAL_COMMUNITY): Payer: Self-pay | Admitting: Cardiology

## 2012-07-31 DIAGNOSIS — I10 Essential (primary) hypertension: Secondary | ICD-10-CM

## 2012-07-31 DIAGNOSIS — I5023 Acute on chronic systolic (congestive) heart failure: Secondary | ICD-10-CM

## 2012-07-31 MED ORDER — CARVEDILOL 3.125 MG PO TABS: 6.2500 mg | ORAL_TABLET | Freq: Two times a day (BID) | ORAL | Status: DC

## 2012-07-31 MED ORDER — SPIRONOLACTONE 25 MG PO TABS: 25.0000 mg | ORAL_TABLET | Freq: Every day | ORAL | Status: DC

## 2012-07-31 NOTE — Telephone Encounter (Signed)
Pt called to request a refill on Spironolactone and Carvedilol  [employer]

## 2012-08-01 ENCOUNTER — Other Ambulatory Visit (HOSPITAL_COMMUNITY): Payer: Self-pay | Admitting: *Deleted

## 2012-08-01 MED ORDER — CARVEDILOL 6.25 MG PO TABS: 6.2500 mg | ORAL_TABLET | Freq: Two times a day (BID) | ORAL | Status: DC

## 2012-08-11 ENCOUNTER — Ambulatory Visit (HOSPITAL_COMMUNITY)
Admission: RE | Admit: 2012-08-11 | Discharge: 2012-08-11 | Disposition: A | Discharge status: Home / Self Care | Payer: PRIVATE HEALTH INSURANCE | Source: Ambulatory Visit | Attending: Internal Medicine | Admitting: Internal Medicine

## 2012-08-11 VITALS — BP 114/78 | HR 100 | Wt 203.2 lb

## 2012-08-11 DIAGNOSIS — I5022 Chronic systolic (congestive) heart failure: Secondary | ICD-10-CM

## 2012-08-11 DIAGNOSIS — I10 Essential (primary) hypertension: Secondary | ICD-10-CM

## 2012-08-11 MED ORDER — DIGOXIN 250 MCG PO TABS: 0.2500 mg | ORAL_TABLET | Freq: Every day | ORAL | Status: DC

## 2012-08-11 NOTE — Patient Instructions (Addendum)
Start Digoxin 0.25 mg daily  Your physician recommends that you schedule a follow-up appointment in: 2-3 weeks

## 2012-08-11 NOTE — Progress Notes (Signed)
Referring Physician: Tereso Newcomer, PA at [facility] Primary Care: Dr. Jeri Cos  Primary Cardiologist: Dr Tonny Bollman  Weight Range: 214 pounds  Baseline proBNP: 10618 on 04/30/12  HPI: Ms. Wesenberg is a 52 y.o. female with history of multiple sclerosis (diagnosed in late 46s).  She has been treated with Novantrone, Tysabri, Copaxone, Symmetrel and Dimethyl Fumerate for her MS.  She is scooter-bound with long distances but can ambulate short distances around the house.  She also has systolic heart failure, EF 25% no prior ischemic work up.  She has hypertension and depression as well.      Admitted 10/28-10/31 to [facility] for a two-week history of progressively worsening shortness of breath, LE edema, orthopnea and decreased urine output. CT was negative for pulmonary embolism. Findings were suggestive of pulmonary edema. Echo 04/29/12: Mild LVH, EF 20-25%, mild AI, moderate MR, moderate LAE, mild RAE, mild RVE, moderate TR, PASP 51, small pericardial effusion.  She was diuresed and discharged home. Discharge weight 214 pounds.   She returns for follow up today with her aid. Last visit no med titration due to dizziness.  She says she ok.  She continues to have dizziness with standing.  No syncope.  Noted increased fatigue and dyspnea with movement over the last month.  She does take an extra fluid pill when she swells and it improves symptoms.  Chronic orthopnea.  She is wheelchair bound most of the time but can ambulate some at home.  Compliant with meds.  Nurse Aide there during the day for several hours.     Review of Systems: All pertinent positives and negatives as in HPI, otherwise negative.   Past Medical History  Diagnosis Date  . MS (multiple sclerosis)     a. Dx'd late 20's. b. Tx with Novantrone, Tysabri, Copaxone previously.  Marland Kitchen Hypertension   . Depression   . Glaucoma(365)   . Cardiomyopathy     a.  Echo 04/29/12: Mild LVH, EF 20-25%, mild AI, moderate MR, moderate LAE, mild RAE,  mild RVE, moderate TR, PASP 51, small pericardial effusion;   b. probably non-ischemic given multiple chemo-Tx agents used for MS and global LV dysfn on echo  . Chronic systolic heart failure     Current Outpatient Prescriptions  Medication Sig Dispense Refill  . amantadine (SYMMETREL) 100 MG capsule Take 100 mg by mouth 2 (two) times daily.       . carvedilol (COREG) 6.25 MG tablet Take 1 tablet (6.25 mg total) by mouth 2 (two) times daily with a meal.  60 tablet  3  . Cholecalciferol (VITAMIN D3 PO) Take 1 tablet by mouth daily.        . Cyanocobalamin (VITAMIN B12 PO) Take 1 tablet by mouth daily.        . Dimethyl Fumarate (TECFIDERA) 240 MG CPDR Take 1 capsule by mouth 2 (two) times daily.      . furosemide (LASIX) 40 MG tablet Take 1 tablet (40 mg total) by mouth 2 (two) times daily.  60 tablet  6  . gabapentin (NEURONTIN) 600 MG tablet Take 600 mg by mouth 3 (three) times daily.       Marland Kitchen imipramine (TOFRANIL) 50 MG tablet Take 100 mg by mouth at bedtime.        Marland Kitchen lisinopril (PRINIVIL,ZESTRIL) 5 MG tablet Take 5 mg by mouth daily.      . Multiple Vitamin (MULTIVITAMIN WITH MINERALS) TABS Take 1 tablet by mouth daily.      . potassium  chloride SA (K-DUR,KLOR-CON) 20 MEQ tablet Take 40 mEq by mouth daily.   1 tablet  0  . solifenacin (VESICARE) 10 MG tablet Take 5 mg by mouth 2 (two) times daily.        Marland Kitchen spironolactone (ALDACTONE) 25 MG tablet Take 1 tablet (25 mg total) by mouth daily.  30 tablet  3  . Travoprost, BAK Free, (TRAVATAN) 0.004 % SOLN ophthalmic solution Place 1 drop into both eyes at bedtime.       Marland Kitchen zolpidem (AMBIEN) 5 MG tablet Take 5 mg by mouth at bedtime.        No current facility-administered medications for this encounter.    Allergies  Allergen Reactions  . Sulfonamide Derivatives Shortness Of Breath  . Penicillins Hives and Itching     PHYSICAL EXAM: Filed Vitals:   08/11/12 1402  BP: 114/78  Pulse: 100  Weight: 203 lb 4 oz (92.194 kg)  SpO2: 100%     General:  Chronically ill  appearing. No respiratory difficulty.  In wheelchair  HEENT: normal Neck: supple. JVP 6-7 with +HJR. Carotids 2+ bilat; no bruits. No lymphadenopathy or thryomegaly appreciated. Cor: PMI nondisplaced. Regular rate & rhythm. No rubs, gallops or murmurs. Lungs: clear Abdomen: soft, nontender, nondistended. No hepatosplenomegaly. No bruits or masses. Good bowel sounds. Extremities: no cyanosis, clubbing, rash, trace edema Neuro: alert & oriented x 3, cranial nerves grossly intact. moves all 4 extremities w/o difficulty. Affect pleasant.   ASSESSMENT & PLAN:

## 2012-08-12 NOTE — Assessment & Plan Note (Addendum)
Volume status stable but she feels she is more fatigued with minimal activity over the last month.  Will start digoxin 0.25 mg daily.  Will reassess in 2-3 weeks.  Continue carvedilol, lisinopril, spiro  And lasix.  Have re-educated on sliding scale lasix.

## 2012-08-12 NOTE — Assessment & Plan Note (Signed)
Well controlled, continue current therapy. 

## 2012-08-27 ENCOUNTER — Ambulatory Visit (HOSPITAL_COMMUNITY)
Admission: RE | Admit: 2012-08-27 | Discharge: 2012-08-27 | Disposition: A | Discharge status: Home / Self Care | Payer: PRIVATE HEALTH INSURANCE | Source: Ambulatory Visit | Attending: Internal Medicine | Admitting: Internal Medicine

## 2012-08-27 VITALS — BP 112/76 | HR 82 | Wt 205.2 lb

## 2012-08-27 DIAGNOSIS — I10 Essential (primary) hypertension: Secondary | ICD-10-CM | POA: Insufficient documentation

## 2012-08-27 DIAGNOSIS — I5022 Chronic systolic (congestive) heart failure: Secondary | ICD-10-CM | POA: Insufficient documentation

## 2012-08-27 DIAGNOSIS — G35 Multiple sclerosis: Secondary | ICD-10-CM | POA: Insufficient documentation

## 2012-08-27 MED ORDER — LISINOPRIL 5 MG PO TABS: 5.0000 mg | ORAL_TABLET | Freq: Two times a day (BID) | ORAL | Status: DC

## 2012-08-27 NOTE — Progress Notes (Signed)
Referring Physician: Tereso Newcomer, PA at [facility] Primary Care: Dr. Jeri Cos  Primary Cardiologist: Dr Tonny Bollman  Weight Range: 214 pounds  Baseline proBNP: 10618 on 04/30/12  HPI: Shannon Garrett is a 52 y.o. female with history of multiple sclerosis (diagnosed in late 43s).  She has been treated with Novantrone, Tysabri, Copaxone, Symmetrel and Dimethyl Fumerate for her MS.  She is scooter-bound with long distances but can ambulate short distances around the house.  She also has systolic heart failure, EF 25% no prior ischemic work up.  She has hypertension and depression as well.      Admitted 10/28-10/31 to [facility] for a two-week history of progressively worsening shortness of breath, LE edema, orthopnea and decreased urine output. CT was negative for pulmonary embolism. Findings were suggestive of pulmonary edema. Echo 04/29/12: Mild LVH, EF 20-25%, mild AI, moderate MR, moderate LAE, mild RAE, mild RVE, moderate TR, PASP 51, small pericardial effusion.  She was diuresed and discharged home. Discharge weight 214 pounds.   She returns for follow up today with her caregiver.  She has recently started seeing a new neurologist, Dr. Lin Givens in [place].  He has added 2 new medications that she is unsure of the names and is going to give her 5 days of solumedrol to try and help her get around a little better.  Last visit digoxin was added and she felt that this may have had some benefit.  She missed all her fluid pills yesterday because she went out and has had problems with incontinence on lasix.  She feels her fluid is up some today because of this.  She has been seeing an urologist for her bladder incontinence.  She tried a catheter on Friday but said it was uncomfortable and didn't want to wear it.  She said there is a surgical option she is considering.  She denies orthopnea/PND.      Review of Systems: All pertinent positives and negatives as in HPI, otherwise negative.   Past Medical  History  Diagnosis Date  . MS (multiple sclerosis)     a. Dx'd late 20's. b. Tx with Novantrone, Tysabri, Copaxone previously.  Marland Kitchen Hypertension   . Depression   . Glaucoma(365)   . Cardiomyopathy     a.  Echo 04/29/12: Mild LVH, EF 20-25%, mild AI, moderate MR, moderate LAE, mild RAE, mild RVE, moderate TR, PASP 51, small pericardial effusion;   b. probably non-ischemic given multiple chemo-Tx agents used for MS and global LV dysfn on echo  . Chronic systolic heart failure     Current Outpatient Prescriptions  Medication Sig Dispense Refill  . amantadine (SYMMETREL) 100 MG capsule Take 100 mg by mouth 2 (two) times daily.       . carvedilol (COREG) 6.25 MG tablet Take 1 tablet (6.25 mg total) by mouth 2 (two) times daily with a meal.  60 tablet  3  . Cholecalciferol (VITAMIN D3 PO) Take 1 tablet by mouth daily.        . Cyanocobalamin (VITAMIN B12 PO) Take 1 tablet by mouth daily.        . digoxin (LANOXIN) 0.25 MG tablet Take 1 tablet (0.25 mg total) by mouth daily.      . Dimethyl Fumarate (TECFIDERA) 240 MG CPDR Take 1 capsule by mouth 2 (two) times daily.      . furosemide (LASIX) 40 MG tablet 80 mg in AM and 40 mg in PM      . gabapentin (NEURONTIN) 600 MG  tablet Take 600 mg by mouth 3 (three) times daily.       Marland Kitchen imipramine (TOFRANIL) 50 MG tablet Take 100 mg by mouth at bedtime.        Marland Kitchen lisinopril (PRINIVIL,ZESTRIL) 5 MG tablet Take 5 mg by mouth daily.      . Multiple Vitamin (MULTIVITAMIN WITH MINERALS) TABS Take 1 tablet by mouth daily.      . potassium chloride SA (K-DUR,KLOR-CON) 20 MEQ tablet Take 40 mEq by mouth daily.   1 tablet  0  . solifenacin (VESICARE) 10 MG tablet Take 5 mg by mouth 2 (two) times daily.        Marland Kitchen spironolactone (ALDACTONE) 25 MG tablet Take 1 tablet (25 mg total) by mouth daily.  30 tablet  3  . Travoprost, BAK Free, (TRAVATAN) 0.004 % SOLN ophthalmic solution Place 1 drop into both eyes at bedtime.       Marland Kitchen zolpidem (AMBIEN) 5 MG tablet Take 5 mg by  mouth at bedtime.        No current facility-administered medications for this encounter.    Allergies  Allergen Reactions  . Sulfonamide Derivatives Shortness Of Breath  . Penicillins Hives and Itching     PHYSICAL EXAM: Filed Vitals:   08/27/12 1355  BP: 112/76  Pulse: 82  Weight: 205 lb 4 oz (93.101 kg)  SpO2: 100%    General:  Chronically ill  appearing. No respiratory difficulty.  In wheelchair  HEENT: normal Neck: supple. JVP 6-7 with +HJR. Carotids 2+ bilat; no bruits. No lymphadenopathy or thryomegaly appreciated. Cor: PMI nondisplaced. Regular rate & rhythm. No rubs, gallops or murmurs. Lungs: clear Abdomen: soft, nontender, nondistended. No hepatosplenomegaly. No bruits or masses. Good bowel sounds. Extremities: no cyanosis, clubbing, rash, trace edema Neuro: alert & oriented x 3, cranial nerves grossly intact. moves all 4 extremities w/o difficulty. Affect pleasant.   ASSESSMENT & PLAN:

## 2012-08-27 NOTE — Assessment & Plan Note (Addendum)
Volume status mildly elevated due to missing lasix dose yesterday.  Will have her double up lasix dose today, 80/80 mg.  Will titrated afterload reduction with lisinopril 5 mg BID.  Have discussed fluid restrictions again as this has been difficult to control.  Will recheck echo at follow up.

## 2012-08-27 NOTE — Assessment & Plan Note (Signed)
Well-controlled, continue current regimen 

## 2012-08-27 NOTE — Assessment & Plan Note (Signed)
She will call with new MS medication.  Will discuss options with provider for more comfortable catheter insertion as this allows her to continue to complete her day to day activities.

## 2012-08-27 NOTE — Patient Instructions (Addendum)
Increase lisinopril 5 mg twice daily.  Follow up 2-3 weeks with echo.

## 2012-09-04 ENCOUNTER — Emergency Department (HOSPITAL_COMMUNITY): Payer: PRIVATE HEALTH INSURANCE

## 2012-09-04 ENCOUNTER — Other Ambulatory Visit: Payer: Self-pay

## 2012-09-04 ENCOUNTER — Encounter (HOSPITAL_COMMUNITY): Payer: Self-pay | Admitting: *Deleted

## 2012-09-04 ENCOUNTER — Inpatient Hospital Stay (HOSPITAL_COMMUNITY): Payer: PRIVATE HEALTH INSURANCE

## 2012-09-04 ENCOUNTER — Inpatient Hospital Stay (HOSPITAL_COMMUNITY)
Admission: EM | Admit: 2012-09-04 | Discharge: 2012-09-08 | DRG: 059 | Disposition: A | Discharge status: Home Health | Payer: PRIVATE HEALTH INSURANCE | Attending: Internal Medicine | Admitting: Internal Medicine

## 2012-09-04 DIAGNOSIS — I110 Hypertensive heart disease with heart failure: Secondary | ICD-10-CM | POA: Diagnosis present

## 2012-09-04 DIAGNOSIS — G35 Multiple sclerosis: Principal | ICD-10-CM

## 2012-09-04 DIAGNOSIS — D638 Anemia in other chronic diseases classified elsewhere: Secondary | ICD-10-CM | POA: Diagnosis present

## 2012-09-04 DIAGNOSIS — I5022 Chronic systolic (congestive) heart failure: Secondary | ICD-10-CM

## 2012-09-04 DIAGNOSIS — I5023 Acute on chronic systolic (congestive) heart failure: Secondary | ICD-10-CM

## 2012-09-04 DIAGNOSIS — H409 Unspecified glaucoma: Secondary | ICD-10-CM | POA: Diagnosis present

## 2012-09-04 DIAGNOSIS — I5042 Chronic combined systolic (congestive) and diastolic (congestive) heart failure: Secondary | ICD-10-CM | POA: Diagnosis present

## 2012-09-04 DIAGNOSIS — F329 Major depressive disorder, single episode, unspecified: Secondary | ICD-10-CM | POA: Diagnosis present

## 2012-09-04 DIAGNOSIS — Z79899 Other long term (current) drug therapy: Secondary | ICD-10-CM

## 2012-09-04 DIAGNOSIS — R5383 Other fatigue: Secondary | ICD-10-CM

## 2012-09-04 DIAGNOSIS — W19XXXA Unspecified fall, initial encounter: Secondary | ICD-10-CM

## 2012-09-04 DIAGNOSIS — IMO0002 Reserved for concepts with insufficient information to code with codable children: Secondary | ICD-10-CM | POA: Diagnosis present

## 2012-09-04 DIAGNOSIS — R5381 Other malaise: Secondary | ICD-10-CM

## 2012-09-04 DIAGNOSIS — E43 Unspecified severe protein-calorie malnutrition: Secondary | ICD-10-CM

## 2012-09-04 DIAGNOSIS — M6282 Rhabdomyolysis: Secondary | ICD-10-CM | POA: Diagnosis present

## 2012-09-04 DIAGNOSIS — R821 Myoglobinuria: Secondary | ICD-10-CM

## 2012-09-04 DIAGNOSIS — J9600 Acute respiratory failure, unspecified whether with hypoxia or hypercapnia: Secondary | ICD-10-CM

## 2012-09-04 DIAGNOSIS — R509 Fever, unspecified: Secondary | ICD-10-CM

## 2012-09-04 DIAGNOSIS — D352 Benign neoplasm of pituitary gland: Secondary | ICD-10-CM | POA: Diagnosis present

## 2012-09-04 DIAGNOSIS — S0083XA Contusion of other part of head, initial encounter: Secondary | ICD-10-CM

## 2012-09-04 DIAGNOSIS — R531 Weakness: Secondary | ICD-10-CM

## 2012-09-04 DIAGNOSIS — I428 Other cardiomyopathies: Secondary | ICD-10-CM

## 2012-09-04 DIAGNOSIS — F3289 Other specified depressive episodes: Secondary | ICD-10-CM | POA: Diagnosis present

## 2012-09-04 DIAGNOSIS — G8220 Paraplegia, unspecified: Secondary | ICD-10-CM

## 2012-09-04 DIAGNOSIS — E669 Obesity, unspecified: Secondary | ICD-10-CM

## 2012-09-04 DIAGNOSIS — L259 Unspecified contact dermatitis, unspecified cause: Secondary | ICD-10-CM

## 2012-09-04 DIAGNOSIS — Z9181 History of falling: Secondary | ICD-10-CM

## 2012-09-04 DIAGNOSIS — E860 Dehydration: Secondary | ICD-10-CM | POA: Diagnosis present

## 2012-09-04 DIAGNOSIS — G35D Multiple sclerosis, unspecified: Principal | ICD-10-CM

## 2012-09-04 DIAGNOSIS — Z7409 Other reduced mobility: Secondary | ICD-10-CM

## 2012-09-04 DIAGNOSIS — I10 Essential (primary) hypertension: Secondary | ICD-10-CM

## 2012-09-04 DIAGNOSIS — I509 Heart failure, unspecified: Secondary | ICD-10-CM

## 2012-09-04 DIAGNOSIS — D539 Nutritional anemia, unspecified: Secondary | ICD-10-CM | POA: Diagnosis present

## 2012-09-04 LAB — COMPREHENSIVE METABOLIC PANEL
Alkaline Phosphatase: 111 U/L (ref 39–117)
BUN: 13 mg/dL (ref 6–23)
GFR calc Af Amer: >90 mL/min (ref >90)
Glucose, Bld: 122 mg/dL — ABNORMAL HIGH (ref 70–99)
Potassium: 3.3 mEq/L — ABNORMAL LOW (ref 3.5–5.1)
Total Bilirubin: 0.4 mg/dL (ref 0.3–1.2)
Total Protein: 7.7 g/dL (ref 6.0–8.3)

## 2012-09-04 LAB — URINALYSIS, MICROSCOPIC ONLY
Bilirubin Urine: NEGATIVE
Glucose, UA: NEGATIVE mg/dL
Ketones, ur: NEGATIVE mg/dL
Leukocytes, UA: NEGATIVE
Nitrite: POSITIVE — AB
Protein, ur: NEGATIVE mg/dL
Specific Gravity, Urine: 1.012 (ref 1.005–1.030)
Urobilinogen, UA: 0.2 mg/dL (ref 0.0–1.0)
pH: 7 (ref 5.0–8.0)

## 2012-09-04 LAB — CBC WITH DIFFERENTIAL/PLATELET
Eosinophils Absolute: 0 10*3/uL (ref 0.0–0.7)
Eosinophils Relative: 0 % (ref 0–5)
HCT: 37.3 % (ref 36.0–46.0)
Hemoglobin: 13.6 g/dL (ref 12.0–15.0)
Lymphs Abs: 0.6 10*3/uL — ABNORMAL LOW (ref 0.7–4.0)
MCH: 29.2 pg (ref 26.0–34.0)
MCV: 80.2 fL (ref 78.0–100.0)
Monocytes Relative: 7 % (ref 3–12)
Neutrophils Relative %: 87 % — ABNORMAL HIGH (ref 43–77)
RBC: 4.65 MIL/uL (ref 3.87–5.11)

## 2012-09-04 LAB — CBC
HCT: 39.5 % (ref 36.0–46.0)
Hemoglobin: 14.1 g/dL (ref 12.0–15.0)
MCH: 29.4 pg (ref 26.0–34.0)
MCHC: 35.7 g/dL (ref 30.0–36.0)
RBC: 4.8 MIL/uL (ref 3.87–5.11)

## 2012-09-04 LAB — TROPONIN I
Troponin I: <0.3 ng/mL (ref <0.30)
Troponin I: <0.3 ng/mL (ref <0.30)

## 2012-09-04 LAB — CREATININE, SERUM: GFR calc non Af Amer: >90 mL/min (ref >90)

## 2012-09-04 LAB — CK: Total CK: 2396 U/L — ABNORMAL HIGH (ref 7–177)

## 2012-09-04 MED ORDER — HEPARIN SODIUM (PORCINE) 5000 UNIT/ML IJ SOLN
5000.0000 [IU] | Freq: Three times a day (TID) | INTRAMUSCULAR | Status: DC
  Administered 2012-09-04 – 2012-09-08 (×13): 5000 [IU] via SUBCUTANEOUS
  Filled 2012-09-04 (×15): qty 1

## 2012-09-04 MED ORDER — TEMAZEPAM 15 MG PO CAPS
15.0000 mg | ORAL_CAPSULE | Freq: Every day | ORAL | Status: DC
  Administered 2012-09-05 – 2012-09-07 (×3): 15 mg via ORAL
  Filled 2012-09-04 (×3): qty 1

## 2012-09-04 MED ORDER — SODIUM CHLORIDE 0.9 % IV SOLN
INTRAVENOUS | Status: DC
  Administered 2012-09-04: 23:00:00 via INTRAVENOUS

## 2012-09-04 MED ORDER — MORPHINE SULFATE 4 MG/ML IJ SOLN
4.0000 mg | Freq: Once | INTRAMUSCULAR | Status: AC
Start: 2012-09-04 — End: 2012-09-04
  Administered 2012-09-04: 4 mg via INTRAVENOUS
  Filled 2012-09-04: qty 1

## 2012-09-04 MED ORDER — AMANTADINE HCL 100 MG PO CAPS
100.0000 mg | ORAL_CAPSULE | Freq: Two times a day (BID) | ORAL | Status: DC
  Administered 2012-09-05 – 2012-09-08 (×7): 100 mg via ORAL
  Filled 2012-09-04 (×9): qty 1

## 2012-09-04 MED ORDER — SODIUM CHLORIDE 0.9 % IV BOLUS (SEPSIS)
1000.0000 mL | Freq: Once | INTRAVENOUS | Status: AC
  Administered 2012-09-04: 1000 mL via INTRAVENOUS

## 2012-09-04 MED ORDER — DARIFENACIN HYDROBROMIDE ER 7.5 MG PO TB24
7.5000 mg | ORAL_TABLET | Freq: Every day | ORAL | Status: DC
  Administered 2012-09-04 – 2012-09-08 (×5): 7.5 mg via ORAL
  Filled 2012-09-04 (×5): qty 1

## 2012-09-04 MED ORDER — MORPHINE SULFATE 2 MG/ML IJ SOLN
2.0000 mg | INTRAMUSCULAR | Status: AC | PRN
  Administered 2012-09-05 – 2012-09-07 (×3): 2 mg via INTRAVENOUS
  Filled 2012-09-04 (×3): qty 1

## 2012-09-04 MED ORDER — GADOBENATE DIMEGLUMINE 529 MG/ML IV SOLN
20.0000 mL | Freq: Once | INTRAVENOUS | Status: AC
  Administered 2012-09-04: 20 mL via INTRAVENOUS

## 2012-09-04 MED ORDER — DIGOXIN 250 MCG PO TABS
0.2500 mg | ORAL_TABLET | Freq: Every day | ORAL | Status: DC
  Administered 2012-09-04 – 2012-09-08 (×5): 0.25 mg via ORAL
  Filled 2012-09-04 (×5): qty 1

## 2012-09-04 MED ORDER — CARVEDILOL 6.25 MG PO TABS
6.2500 mg | ORAL_TABLET | Freq: Two times a day (BID) | ORAL | Status: DC
Start: 2012-09-04 — End: 2012-09-08
  Administered 2012-09-04 – 2012-09-08 (×8): 6.25 mg via ORAL
  Filled 2012-09-04 (×10): qty 1

## 2012-09-04 MED ORDER — ZOLPIDEM TARTRATE 5 MG PO TABS: 5.0000 mg | ORAL_TABLET | Freq: Every day | ORAL | Status: DC

## 2012-09-04 MED ORDER — IMIPRAMINE HCL 50 MG PO TABS
100.0000 mg | ORAL_TABLET | Freq: Every day | ORAL | Status: DC
  Administered 2012-09-05 – 2012-09-07 (×3): 100 mg via ORAL
  Filled 2012-09-04 (×5): qty 2

## 2012-09-04 MED ORDER — SPIRONOLACTONE 12.5 MG HALF TABLET
12.5000 mg | ORAL_TABLET | Freq: Every morning | ORAL | Status: DC
  Administered 2012-09-05 – 2012-09-07 (×3): 12.5 mg via ORAL
  Filled 2012-09-04 (×3): qty 1

## 2012-09-04 MED ORDER — TRAVOPROST (BAK FREE) 0.004 % OP SOLN
1.0000 [drp] | Freq: Every day | OPHTHALMIC | Status: DC
  Administered 2012-09-04 – 2012-09-07 (×4): 1 [drp] via OPHTHALMIC
  Filled 2012-09-04: qty 2.5

## 2012-09-04 MED ORDER — GABAPENTIN 600 MG PO TABS
600.0000 mg | ORAL_TABLET | Freq: Three times a day (TID) | ORAL | Status: DC
  Administered 2012-09-04 – 2012-09-08 (×12): 600 mg via ORAL
  Filled 2012-09-04 (×14): qty 1

## 2012-09-04 NOTE — ED Notes (Addendum)
Per EMS pt from home with c/o fall last night around 10pm. Pt has hx of MS. Pt fell, stayed on floor all night, found by nurse aid this am. Pt states she felt weaker on right side prior to fall. Alert and oriented x 3. Hematoma to left eye, small laceration to left thigh from hitting wheelchair. VS BP 158/100 HR 78. IV 22G R Hand. CBG 94.  Pt arrived on KED, c-collar in place. Pt denies neck/back pain.

## 2012-09-04 NOTE — Progress Notes (Signed)
PT has an order to be npo. rn paged np on call [facility]. to see if it's ok to give po meds. rn will hold meds and await further orders.

## 2012-09-04 NOTE — Progress Notes (Signed)
Occupational Therapy Evaluation Patient Details Name: Shannon Garrett MRN: [id number] DOB: Nov 15, 1960 Today's Date: 09/04/2012 Time: 1732- 1705  33 min    OT Assessment / Plan / Recommendation Clinical Impression  52 yo with MS who fell at home and was on floor @ 10 hrs before she was found. Pt has PCA @ 8 hrs/day, who found pt. Pt is unable to return home at this time. Pt is agreeable to going to SNF for rehab and then determine appropriate D/C plan at that time. Pt discussed difficulty accepting the progression of her disease. Rec Palliative care consult for education and support regarding her disease process. OT will follow acutely to max independence with ADL to faciliate D/C to SNF.    OT Assessment  Patient needs continued OT Services    Follow Up Recommendations  SNF    Barriers to Discharge Decreased caregiver support    Equipment Recommendations  None recommended by OT    Recommendations for Other Services  Palliative Care consult for support  Frequency  Min 2X/week    Precautions / Restrictions Precautions Precautions: Fall   Pertinent Vitals/Pain C/o general pain from fall    ADL  Eating/Feeding: Minimal assistance (has been having difficulty lately) Where Assessed - Eating/Feeding: Bed level Grooming: Moderate assistance Where Assessed - Grooming: Supported sitting Upper Body Bathing: Moderate assistance Where Assessed - Upper Body Bathing: Supported sitting Lower Body Bathing: +1 Total assistance Where Assessed - Lower Body Bathing: Rolling right and/or left Upper Body Dressing: Maximal assistance Where Assessed - Upper Body Dressing: Supported sitting Lower Body Dressing: +1 Total assistance Where Assessed - Lower Body Dressing: Rolling right and/or left Toilet Transfer: +2 Total assistance Transfers/Ambulation Related to ADLs: +2 ADL Comments: unable to fully assess transfers. Pt +2    OT Diagnosis: Generalized weakness;Acute pain;Cognitive deficits  OT  Problem List: Decreased strength;Decreased range of motion;Decreased activity tolerance;Impaired balance (sitting and/or standing);Decreased coordination;Decreased safety awareness;Decreased knowledge of use of DME or AE;Decreased knowledge of precautions;Impaired sensation;Impaired tone;Obesity;Impaired UE functional use;Pain OT Treatment Interventions: Self-care/ADL training;Therapeutic exercise;Neuromuscular education;Energy conservation;DME and/or AE instruction;Therapeutic activities;Cognitive remediation/compensation;Patient/family education;Balance training   OT Goals Acute Rehab OT Goals OT Goal Formulation: With patient Time For Goal Achievement: 09/18/12 Potential to Achieve Goals: Good ADL Goals Pt Will Perform Eating: with modified independence;Sitting, chair;with adaptive utensils ADL Goal: Eating - Progress: Goal set today Pt Will Perform Grooming: with min assist;with adaptive equipment;Supported ADL Goal: Grooming - Progress: Goal set today Pt Will Perform Upper Body Bathing: with set-up;with supervision;with adaptive equipment;Supported ADL Goal: Upper Body Bathing - Progress: Goal set today Pt Will Transfer to Toilet: with DME;3-in-1;Drop arm 3-in-1;with max assist ADL Goal: Toilet Transfer - Progress: Goal set today Additional ADL Goal #1: Pt will maintain sitting balance EOB x 10 min with s in prep for ADL ADL Goal: Additional Goal #1 - Progress: Goal set today Arm Goals Pt Will Complete Theraputty Exer: with supervision, verbal cues required/provided;to increase strength;Bilateral upper extremities;Min resistance putty Arm Goal: Theraputty Exercises - Progress: Goal set today  Visit Information  Last OT Received On: 09/04/12 Assistance Needed: +2    Subjective Data  Patient Stated Goal: To go home   Prior Functioning     Home Living Lives With:  (housemate there only during thenite) Available Help at Discharge: Personal care attendant (7 days/wk 8 hrs per  day.) Type of Home: House Home Access: Level entry Home Layout: One level Bathroom Shower/Tub: Engineer, manufacturing systems: Handicapped height Bathroom Accessibility: No Home Adaptive Equipment: Environmental consultant -  rolling;Wheelchair - powered;Bedside commode/3-in-1;Tub transfer bench Prior Function Level of Independence: Independent with assistive device(s);Needs assistance (insure of level. pt inconsistent) Needs Assistance: Bathing;Dressing;Toileting;Meal Prep;Light Housekeeping;Transfers Bath: Minimal Dressing: Moderate Toileting: Moderate Meal Prep: Total Light Housekeeping: Total Transfer Assistance: min A to S Able to Take Stairs?: No Driving: No Comments: Pt states she fell walking from the hall to the bedroom. PCA usually leaves snacks and did not leave snacks so she was going to get something to eat. Dominant Hand: Right         Vision/Perception  will assess   Cognition  Cognition Overall Cognitive Status: No family/caregiver present to determine baseline cognitive functioning Arousal/Alertness: Awake/alert Orientation Level: Appears intact for tasks assessed Behavior During Session: [facility] for tasks performed Cognition - Other Comments: appears impaired. decreased safety/judgement.     Extremity/Trunk Assessment Right Upper Extremity Assessment RUE ROM/Strength/Tone: Deficits RUE ROM/Strength/Tone Deficits: limitedAROM shoulder. unable to complete any shoulder movement. elbow WFL for ROM 3+/5 strength. Hand 3/5. poor in hand manipulation skills RUE Sensation: Deficits RUE Coordination: Deficits RUE Coordination Deficits: diffiuclty holding objects. frequent drops Left Upper Extremity Assessment LUE ROM/Strength/Tone: Deficits LUE ROM/Strength/Tone Deficits: same as R Right Lower Extremity Assessment RLE ROM/Strength/Tone: Deficits (unable to elicit any AROM BLE) Left Lower Extremity Assessment LLE ROM/Strength/Tone: Deficits Trunk Assessment Trunk Assessment:  Other exceptions Trunk Exceptions: poor trunk control Requires support to assist with ADL     Mobility Bed Mobility Bed Mobility: Supine to Sit Supine to Sit: 1: +1 Total assist Transfers Transfers: Not assessed Details for Transfer Assistance: needs +2     Exercise     Balance  sitting - poor   End of Session OT - End of Session Activity Tolerance: Patient tolerated treatment well Patient left: in bed;with call bell/phone within reach Nurse Communication: Mobility status  GO     WARD,HILLARY 09/04/2012, 6:11 PM [facility], OTR/L  [phone] 09/04/2012

## 2012-09-04 NOTE — H&P (Signed)
[facility] History and Physical  Shannon Garrett ZOX:[id number] DOB: Oct 28, 1960 DOA: 09/04/2012  Referring physician: Oletta Lamas PCP: Jeri Cos, MD  Specialists: none currently  Chief Complaint:   HPI: Shannon Garrett is a 52 y.o. female with a history of MS who was at home yeaterday evening when she got dizzy on standing.  SHe was gettign up out of her wheelchair to get to her bed when she got up.  THIs happens occasioanlly.  SHe didnnot have any CP at the time.  NO Los of conciousness.  SHe that she had fallebn.  SHe couldn't gte up and her aide found her laying there 09:00 this am and she was brought her over to the ED for an evalaution . SHe was recently Rx digoxin by her Heart docs-he does take diuretics and is supposed to have a Urostomy placed to help her due to the fact that she cannot ambulate to getn up [facility] did have some issues with SOB initally when she fell but this is better now.  The last time she thinks she had fallen was maybe a few weeks ago but did not seek medical attention, as she usually can get up on her own, patient usually prefers to ambulate with the help of a wheelchair and has an aide that comes to the house/physical therapist who she works with her.  Review of Systems: The patient denies fever, no chilsl, no dysuria, no dark stol, no tarry stool, nop vommit, no sick contacts, no headache, + blurred vision 2/2 to glaucoma, no wekaness out of the ordinary- and is feeling much better now   Past Medical History  Diagnosis Date  . MS (multiple sclerosis)     a. Dx'd late 20's. b. Tx with Novantrone, Tysabri, Copaxone previously.  Marland Kitchen Hypertension   . Depression   . Glaucoma(365)   . Cardiomyopathy     a.  Echo 04/29/12: Mild LVH, EF 20-25%, mild AI, moderate MR, moderate LAE, mild RAE, mild RVE, moderate TR, PASP 51, small pericardial effusion;   b. probably non-ischemic given multiple chemo-Tx agents used for MS and global LV dysfn on echo  . Chronic systolic heart  failure    Chart review   Admission 10.28.13 for SOB/orthopneoa/Relapsing prog MS  Past Surgical History  Procedure Laterality Date  . Ablation      uterine  . Cesarean section     Social History:  reports that she has never smoked. She has never used smokeless tobacco. She reports that she does not drink alcohol or use illicit drugs. Patient lives at home  Allergies  Allergen Reactions  . Sulfonamide Derivatives Shortness Of Breath  . Penicillins Hives and Itching    Family History  Problem Relation Age of Onset  . Hypertension Mother      Prior to Admission medications   Medication Sig Start Date End Date Taking? Authorizing Provider  amantadine (SYMMETREL) 100 MG capsule Take 100 mg by mouth 2 (two) times daily.    Yes Historical Provider, MD  carvedilol (COREG) 6.25 MG tablet Take 1 tablet (6.25 mg total) by mouth 2 (two) times daily with a meal. 08/01/12  Yes Dolores Patty, MD  digoxin (LANOXIN) 0.25 MG tablet Take 1 tablet (0.25 mg total) by mouth daily. 08/11/12  Yes Hadassah Pais, PA-C  furosemide (LASIX) 40 MG tablet Take 60 mg by mouth 2 (two) times daily. 80 mg in AM and 40 mg in PM 07/10/12  Yes Amy D Clegg, NP  gabapentin (NEURONTIN) 600  MG tablet Take 600 mg by mouth 3 (three) times daily.    Yes Historical Provider, MD  imipramine (TOFRANIL) 50 MG tablet Take 100 mg by mouth at bedtime.     Yes Historical Provider, MD  nitrofurantoin (MACRODANTIN) 100 MG capsule Take 100 mg by mouth daily.   Yes Historical Provider, MD  potassium chloride SA (K-DUR,KLOR-CON) 20 MEQ tablet Take 40 mEq by mouth daily.  06/04/12  Yes Tonny Bollman, MD  solifenacin (VESICARE) 10 MG tablet Take 10 mg by mouth 2 (two) times daily.    Yes Historical Provider, MD  spironolactone (ALDACTONE) 25 MG tablet Take 25 mg by mouth every morning.   Yes Historical Provider, MD  temazepam (RESTORIL) 15 MG capsule Take 15 mg by mouth at bedtime.   Yes Historical Provider, MD  Travoprost, BAK  Free, (TRAVATAN) 0.004 % SOLN ophthalmic solution Place 1 drop into both eyes at bedtime.    Yes Historical Provider, MD  zolpidem (AMBIEN) 5 MG tablet Take 5 mg by mouth at bedtime.    Yes Historical Provider, MD   Physical Exam: Filed Vitals:   09/04/12 1149 09/04/12 1332 09/04/12 1423  BP: 144/77 162/81 191/80  Pulse: 95 90 84  Temp: 97.6 F (36.4 C)    TempSrc: Oral    Resp: 18 16 13   SpO2: 100% 100% 100%     General:  Alert pleasant oriented Afro-American female  Eyes: Contusion swelling over left side of face  ENT: Poor dentition throat clear but clear otherwise  Neck: Neck soft supple  Cardiovascular: s1 s2 no m/r/g  Respiratory: cta b, no added sound  Abdomen: soft nt/nd  Skin: no swelling  Musculoskeletal: Range of motion seems to be intact in upper extremities  Psychiatric: Flat affect  Neurologic: Grossly intact flicker movement of the lower extremities in the hips and is able to dorsi and plantar flex but only weakly. Reflexes seem somewhat intact finger-nose-finger is  Labs on Admission:  Basic Metabolic Panel:  Recent Labs Lab 09/04/12 1217  NA 144  K 3.3*  CL 98  CO2 36*  GLUCOSE 122*  BUN 13  CREATININE 0.81  CALCIUM 9.7   Liver Function Tests:  Recent Labs Lab 09/04/12 1217  AST 53*  ALT 100*  ALKPHOS 111  BILITOT 0.4  PROT 7.7  ALBUMIN 3.8   No results found for this basename: LIPASE, AMYLASE,  in the last 168 hours No results found for this basename: AMMONIA,  in the last 168 hours CBC:  Recent Labs Lab 09/04/12 1217  WBC 10.6*  NEUTROABS 9.2*  HGB 13.6  HCT 37.3  MCV 80.2  PLT 241   Cardiac Enzymes:  Recent Labs Lab 09/04/12 1217  CKTOTAL 2396*    BNP (last 3 results)  Recent Labs  04/28/12 1658 04/30/12 0455  PROBNP 16405.0* 10618.0*   CBG: No results found for this basename: GLUCAP,  in the last 168 hours  Radiological Exams on Admission: Dg Chest 2 View  09/04/2012  *RADIOLOGY REPORT*  Clinical  Data: Weakness.  History of fall.  CHEST - 2 VIEW  Comparison: Chest x-ray 06/06/2012.  Findings: Lung volumes are low.  No consolidative airspace disease. No pleural effusions.  No suspicious appearing pulmonary nodules or masses are identified.  No pneumothorax.  Pulmonary vasculature is within normal limits.  Heart size is mildly enlarged (unchanged). The patient is rotated to the left on today's exam, resulting in distortion of the mediastinal contours and reduced diagnostic sensitivity and specificity for mediastinal  pathology.  IMPRESSION: 1.  Low lung volumes without radiographic evidence of acute cardiopulmonary disease. 2.  Mild cardiomegaly.   Original Report Authenticated By: Trudie Reed, M.D.    Ct Maxillofacial Wo Cm  09/04/2012  *RADIOLOGY REPORT*  Clinical Data: History of trauma from a fall with injury to the left eye.  CT MAXILLOFACIAL WITHOUT CONTRAST  Technique:  Multidetector CT imaging of the maxillofacial structures was performed. Multiplanar CT image reconstructions were also generated.  Comparison: Head CT 06/06/2012.  Findings: No acute displaced facial bone fractures are identified. Specifically, the left orbit is intact.  The left globe is intact and retrobulbar soft tissues are within normal limits.  A small amount of soft tissue swelling superficial to the left orbit is compatible with a contusion.  The mandible is intact, and the mandibular condyles are properly located.  Poor dentition with multiple missing teeth incidentally noted.  Small mucosal retention cyst or polyp in the inferior aspect of the right maxillary sinus is incidentally noted. Visualized intracranial contents are remarkable for atrophy and what appear to be chronic ischemic changes in the cerebral white matter (similar to prior).  IMPRESSION: 1.  Soft tissue contusion around the left orbit without evidence of acute displaced facial bone fracture or other signs of significant acute trauma.   Original Report  Authenticated By: Trudie Reed, M.D.     EKG: Independently reviewed. Sinus rhythm with PR interval 0.12 QRS axis 30 potential ST-T wave depressions in contiguous V3 through V6 compared to prior EKG 06/06/2012, currently  Assessment/Plan Active Problems:   ANEMIA, CHRONIC   MULTIPLE SCLEROSIS, PROGRESSIVE/RELAPSING   HYPERTENSION, BENIGN ESSENTIAL   Other primary cardiomyopathies   Chronic systolic heart failure   Fall   EKG abnormalities   1. Fall-seems to be potentially secondary to orthostasis versus primary relapsing remitting MS. Patient was recently started on digoxin which by itself does not have an AV nodal properties however patient also is on diuretics inclusive of Lasix, Aldactone also Coreg. get orthostatic vital signs every shift. 2. EKG changes. We'll cycle cardiac enzymes times 3 repeat EKG in the morning-she's not having any active chest pain harvest contiguous ST depressions are worrisome. He does have a history of cardiomyopathy thoguht to be non-ischemic 3. Cardiomyopathy-likely secondary to medications? Currently stable 4. Potential rhabdomyolysis-patient had on admission CK of 2396 as well as large hemoglobin urine leading me to believe she may be developing some rhabdomyolysis after being found on the ground. She was given IV fluids 1 L and we will continue this at much lower rate of 50 cc an hour as clinically she has some dehydration. I will hold her Lasix however continue her Aldactone at a lower dose of 12.5 daily. 5. Decompensated CHF-continue digoxin 0.25 mg daily, Coreg 6.25 twice a day, hold Lasix as above cut back her spironolactone as above 6. Multiple sclerosis-patient on Symmetrel 100 twice a day.  She states she was recently on a course of Solu-Medrol that was started last week. She currently is not on that-certainly her fall may be a representation of a multiple sclerosis flare-I would defer decision to give high-dose pulse meter prednisone 1000 mg for 5  days to neurology prior tomaking this decision this certainly can be held off of until we get orthostatic vital signs  Neurology consult  Code Status: Full Code  Family Communication: None at bedside  Disposition Plan: Inpatient   Time spent: 70 min  Mahala Menghini [facility] Pager [phone]  If 7PM-7AM, please contact night-coverage  [email or web address] Password [facility] 09/04/2012, 2:46 PM

## 2012-09-04 NOTE — ED Notes (Signed)
Pt remains in xray.

## 2012-09-04 NOTE — Care Management Note (Signed)
    Page 1 of 2   09/08/2012     12:47:02 PM   CARE MANAGEMENT NOTE 09/08/2012  Patient:  Shannon Garrett, Shannon Garrett   Account Number:  [id number]  Date Initiated:  09/04/2012  Documentation initiated by:  Letha Cape  Subjective/Objective Assessment:   dx rhabdo, chf, cardiomyopathy, ms  admit- pt uses w/chair at home, has an aide. patient active with [facility] for RN, PT, OT.     Action/Plan:   pt/ot eval   Anticipated DC Date:  09/08/2012   Anticipated DC Plan:  HOME W HOME HEALTH SERVICES      DC Planning Services  CM consult      [facility] Choice  Resumption Of Svcs/PTA Daine Croker   Choice offered to / List presented to:  [name]        HH arranged  HH-1 RN  HH-2 PT  HH-3 OT      [facility] agency  [facility]   Status of service:  Completed, signed off Medicare Important Message given?   (If response is "NO", the following Medicare IM given date fields will be blank) Date Medicare IM given:   Date Additional Medicare IM given:    Discharge Disposition:  HOME W HOME HEALTH SERVICES  Per UR Regulation:  Reviewed for med. necessity/level of care/duration of stay  If discussed at Long Length of Stay Meetings, dates discussed:    Comments:  09/08/12 12:45 Letha Cape RN, BSN [phone] patient states she would like to continue working with [facility] , referral made to [facility] for [facility], PT, OT ,  patient has an aide with [facility] and she has a motorized w/chair at home.  Patient states she will need an ambulance at discharge, infromed CSW.  Soc will begin 24-48 hrs post discharge.  09/04/12 16:09 Letha Cape RN, BSN [phone] Patient from home, has an aide, await pt/ot eval.  NCM will continue to follow for dc needs.

## 2012-09-04 NOTE — Progress Notes (Signed)
Shannon Garrett [id number] Admission Data: 09/04/2012 9:00 PM Attending Provider: Clydia Llano, MD  WUJ:[id number], Shannon Most, MD Consults/ Treatment Team: Treatment Team:  Kym Groom, MD  Vicki Chaffin is a 52 y.o. female patient admitted from ED awake, alert  & orientated  X 3, Came into ED after fall from wheelchair at home.  Full Code, VSS - Blood pressure 156/75, pulse 88, temperature 98 F (36.7 C), temperature source Oral, resp. rate 18, height 5\' 5"  (1.651 m), weight 91.4 kg (201 lb 8 oz), SpO2 100.00%.,no c/o shortness of breath, no c/o chest pain, no distress noted.   IV site WDL:  hand right, condition patent and no redness with a transparent dsg that's clean dry and intact.  Allergies:   Allergies  Allergen Reactions  . Sulfonamide Derivatives Shortness Of Breath  . Penicillins Hives and Itching     Past Medical History  Diagnosis Date  . MS (multiple sclerosis)     a. Dx'd late 20's. b. Tx with Novantrone, Tysabri, Copaxone previously.  Marland Kitchen Hypertension   . Depression   . Glaucoma(365)   . Cardiomyopathy     a.  Echo 04/29/12: Mild LVH, EF 20-25%, mild AI, moderate MR, moderate LAE, mild RAE, mild RVE, moderate TR, PASP 51, small pericardial effusion;   b. probably non-ischemic given multiple chemo-Tx agents used for MS and global LV dysfn on echo  . Chronic systolic heart failure   . CHF (congestive heart failure)   . Shortness of breath   . Neuromuscular disorder     MS    History:  obtained from the patient.  Pt orientation to unit, room and routine. Information packet given to patient/family and safety video watched.  Admission INP armband ID verified with patient/family, and in place. SR up x 2, fall risk assessment complete with Patient and family verbalizing understanding of risks associated with falls. Pt verbalizes an understanding of how to use the call bell .pt has bruising on right chin and jaw, left eye hematoma, right shoulder and right wrist bruising from  fall from wheelchair. Has a small skin tear on her left buttocks. Pt has history of MS and from home with [facility]  Will cont to monitor and assist as needed.  Cindra Eves, RN 09/04/2012 9:00 PM

## 2012-09-04 NOTE — Consult Note (Signed)
Reason for Consult: fall, multiple sclerosis flair Referring Physician: Dr. Mahala Menghini  CC: weakness  HPI: Shannon Garrett is an 52 y.o. female who fell on 09/03/2012 at 2200 and was found by her aide on 09/04/2012 at 0800 in the floor. She thinks that she was down for 10 hours. The patient says she became weak like she generally does with her MS flairs and fell to the ground. She did mention that she was dizzy. She does "not think" that she hit her head. She is sore "all over". No Brain scans have been done. Her last fall was about 2 weeks ago. She was seen by her MS physician (Dr. Riley Lam) and she was treated at home with home health with solumedrol 1000mg  daily for 5 days. She keeps telling me that she does not want to go into a facility. She does live alone with the exception of the care provided by the home health company.She does also tell me that she has trouble swallowing but is able to feed herself.  Past Medical History  Diagnosis Date  . MS (multiple sclerosis)     a. Dx'd late 20's. b. Tx with Novantrone, Tysabri, Copaxone previously.  Marland Kitchen Hypertension   . Depression   . Glaucoma(365)   . Cardiomyopathy     a.  Echo 04/29/12: Mild LVH, EF 20-25%, mild AI, moderate MR, moderate LAE, mild RAE, mild RVE, moderate TR, PASP 51, small pericardial effusion;   b. probably non-ischemic given multiple chemo-Tx agents used for MS and global LV dysfn on echo  . Chronic systolic heart failure     Past Surgical History  Procedure Laterality Date  . Ablation      uterine  . Cesarean section      Family History  Problem Relation Age of Onset  . Hypertension Mother     Social History:  reports that she has never smoked. She has never used smokeless tobacco. She reports that she does not drink alcohol or use illicit drugs.  Allergies  Allergen Reactions  . Sulfonamide Derivatives Shortness Of Breath  . Penicillins Hives and Itching    Current Facility-Administered Medications  Medication  Dose Route Frequency Provider Last Rate Last Dose  . 0.9 %  sodium chloride infusion   Intravenous Continuous Rhetta Mura, MD      . amantadine (SYMMETREL) capsule 100 mg  100 mg Oral BID Rhetta Mura, MD      . carvedilol (COREG) tablet 6.25 mg  6.25 mg Oral BID WC Rhetta Mura, MD      . darifenacin (ENABLEX) 24 hr tablet 7.5 mg  7.5 mg Oral Daily Rhetta Mura, MD      . digoxin (LANOXIN) tablet 0.25 mg  0.25 mg Oral Daily Rhetta Mura, MD      . gabapentin (NEURONTIN) tablet 600 mg  600 mg Oral TID Rhetta Mura, MD      . heparin injection 5,000 Units  5,000 Units Subcutaneous Q8H Rhetta Mura, MD      . imipramine (TOFRANIL) tablet 100 mg  100 mg Oral QHS Rhetta Mura, MD      . morphine 2 MG/ML injection 2 mg  2 mg Intravenous Q3H PRN Gavin Pound. Ghim, MD      . Melene Muller ON 09/05/2012] spironolactone (ALDACTONE) tablet 12.5 mg  12.5 mg Oral q morning - 10a Rhetta Mura, MD      . temazepam (RESTORIL) capsule 15 mg  15 mg Oral QHS Rhetta Mura, MD      . Travoprost (  BAK Free) (TRAVATAN) 0.004 % ophthalmic solution SOLN 1 drop  1 drop Both Eyes QHS Rhetta Mura, MD      . zolpidem (AMBIEN) tablet 5 mg  5 mg Oral QHS Rhetta Mura, MD        ROS: History obtained from chart review and the patient  General ROS: negative for - chills, fatigue, fever, night sweats, weight gain or weight loss Psychological ROS: negative for - behavioral disorder, hallucinations, memory difficulties, mood swings or suicidal ideation Ophthalmic ROS: negative for - blurry vision, double vision, eye pain or loss of vision ENT ROS: negative for - epistaxis, nasal discharge, oral lesions, sore throat, tinnitus or vertigo Allergy and Immunology ROS: negative for - hives or itchy/watery eyes Hematological and Lymphatic ROS: negative for - bleeding problems, swollen lymph nodes, l+diffuse ecchymosis due to fall Endocrine ROS: negative for -  galactorrhea, hair pattern changes, polydipsia/polyuria or temperature intolerance Respiratory ROS: negative for - cough, hemoptysis, shortness of breath or wheezing Cardiovascular ROS: negative for - chest pain, dyspnea on exertion, edema or irregular heartbeat Gastrointestinal ROS: negative for - abdominal pain, diarrhea, hematemesis, nausea/vomiting or stool incontinence Genito-Urinary ROS: negative for - dysuria, hematuria, incontinence or urinary frequency/urgency Musculoskeletal ROS: negative for - joint swelling, + diffuse muscular weakness Neurological ROS: as noted in HPI Dermatological ROS: negative for rash and skin lesion changes  Physical Examination: Blood pressure 158/82, pulse 85, temperature 98 F (36.7 C), temperature source Oral, resp. rate 18, height 5\' 5"  (1.651 m), weight 91.4 kg (201 lb 8 oz), SpO2 100.00%.  Neurologic Examination Mental Status: Alert, oriented, thought content appropriate. Flat affect. Speech fluent without evidence of aphasia.  Able to follow 3 step commands without difficulty. Cranial Nerves: II: visual fields grossly normal, pupils equal, round, reactive to light and accommodation III,IV, VI: ptosis not present, extra-ocular motions intact bilaterally V,VII: smile asymmetric, minimal right facial droop noted. Facial light touch sensation normal bilaterally VIII: hearing normal bilaterally IX,X: gag reflex present XI: trapezius strength/neck flexion strength normal bilaterally XII: tongue strength normal  Motor: Right : Upper extremity   5/5    Left:     Upper extremity   5/5  Lower extremity   5/5     Lower extremity   5/5 Patient was noted to be tender in all extremities, especially in Upper extremities due to fall. She has multiple bruises.  Sensory: Pinprick and light touch intact throughout, bilaterally Deep Tendon Reflexes: diminished throughout Plantars: equivocal  Cerebellar: patient not ambulated secondary to safety issues SKIN:   Patient has multiple bruises including over left eye, left ear, right face cheek, left upper arm due to fall Heart: regular Lungs: clear bilaterally  Results for orders placed during the hospital encounter of 09/04/12 (from the past 48 hour(s))  CBC WITH DIFFERENTIAL     Status: Abnormal   Collection Time    09/04/12 12:17 PM      Result Value Range   WBC 10.6 (*) 4.0 - 10.5 K/uL   RBC 4.65  3.87 - 5.11 MIL/uL   Hemoglobin 13.6  12.0 - 15.0 g/dL   HCT 16.1  09.6 - 04.5 %   MCV 80.2  78.0 - 100.0 fL   MCH 29.2  26.0 - 34.0 pg   MCHC 36.5 (*) 30.0 - 36.0 g/dL   RDW 40.9 (*) 81.1 - 91.4 %   Platelets 241  150 - 400 K/uL   Neutrophils Relative 87 (*) 43 - 77 %   Neutro Abs 9.2 (*)  1.7 - 7.7 K/uL   Lymphocytes Relative 6 (*) 12 - 46 %   Lymphs Abs 0.6 (*) 0.7 - 4.0 K/uL   Monocytes Relative 7  3 - 12 %   Monocytes Absolute 0.8  0.1 - 1.0 K/uL   Eosinophils Relative 0  0 - 5 %   Eosinophils Absolute 0.0  0.0 - 0.7 K/uL   Basophils Relative 0  0 - 1 %   Basophils Absolute 0.0  0.0 - 0.1 K/uL  COMPREHENSIVE METABOLIC PANEL     Status: Abnormal   Collection Time    09/04/12 12:17 PM      Result Value Range   Sodium 144  135 - 145 mEq/L   Potassium 3.3 (*) 3.5 - 5.1 mEq/L   Chloride 98  96 - 112 mEq/L   CO2 36 (*) 19 - 32 mEq/L   Glucose, Bld 122 (*) 70 - 99 mg/dL   BUN 13  6 - 23 mg/dL   Creatinine, Ser 8.29  0.50 - 1.10 mg/dL   Calcium 9.7  8.4 - 56.2 mg/dL   Total Protein 7.7  6.0 - 8.3 g/dL   Albumin 3.8  3.5 - 5.2 g/dL   AST 53 (*) 0 - 37 U/L   ALT 100 (*) 0 - 35 U/L   Alkaline Phosphatase 111  39 - 117 U/L   Total Bilirubin 0.4  0.3 - 1.2 mg/dL   GFR calc non Af Amer 84 (*) >90 mL/min   GFR calc Af Amer >90  >90 mL/min   Comment:            The eGFR has been calculated     using the CKD EPI equation.     This calculation has not been     validated in all clinical     situations.     eGFR's persistently     <90 mL/min signify     possible Chronic Kidney Disease.   CK     Status: Abnormal   Collection Time    09/04/12 12:17 PM      Result Value Range   Total CK 2396 (*) 7 - 177 U/L  URINALYSIS, MICROSCOPIC ONLY     Status: Abnormal   Collection Time    09/04/12  1:29 PM      Result Value Range   Color, Urine YELLOW  YELLOW   APPearance CLEAR  CLEAR   Specific Gravity, Urine 1.012  1.005 - 1.030   pH 7.0  5.0 - 8.0   Glucose, UA NEGATIVE  NEGATIVE mg/dL   Hgb urine dipstick LARGE (*) NEGATIVE   Comment: Repeated to verify   Bilirubin Urine NEGATIVE  NEGATIVE   Ketones, ur NEGATIVE  NEGATIVE mg/dL   Protein, ur NEGATIVE  NEGATIVE mg/dL   Urobilinogen, UA 0.2  0.0 - 1.0 mg/dL   Nitrite POSITIVE (*) NEGATIVE   Leukocytes, UA NEGATIVE  NEGATIVE   WBC, UA 0-2  <3 WBC/hpf   RBC / HPF 0-2  <3 RBC/hpf   Bacteria, UA RARE  RARE   Squamous Epithelial / LPF RARE  RARE   Casts HYALINE CASTS (*) NEGATIVE   Urine-Other AMORPHOUS URATES/PHOSPHATES      No results found for this or any previous visit (from the past 240 hour(s)).  Dg Chest 2 View  09/04/2012  *RADIOLOGY REPORT*  Clinical Data: Weakness.  History of fall.  CHEST - 2 VIEW  Comparison: Chest x-ray 06/06/2012.  Findings: Lung volumes are low.  No  consolidative airspace disease. No pleural effusions.  No suspicious appearing pulmonary nodules or masses are identified.  No pneumothorax.  Pulmonary vasculature is within normal limits.  Heart size is mildly enlarged (unchanged). The patient is rotated to the left on today's exam, resulting in distortion of the mediastinal contours and reduced diagnostic sensitivity and specificity for mediastinal pathology.  IMPRESSION: 1.  Low lung volumes without radiographic evidence of acute cardiopulmonary disease. 2.  Mild cardiomegaly.   Original Report Authenticated By: Trudie Reed, M.D.    Ct Maxillofacial Wo Cm  09/04/2012  *RADIOLOGY REPORT*  Clinical Data: History of trauma from a fall with injury to the left eye.  CT MAXILLOFACIAL WITHOUT CONTRAST   Technique:  Multidetector CT imaging of the maxillofacial structures was performed. Multiplanar CT image reconstructions were also generated.  Comparison: Head CT 06/06/2012.  Findings: No acute displaced facial bone fractures are identified. Specifically, the left orbit is intact.  The left globe is intact and retrobulbar soft tissues are within normal limits.  A small amount of soft tissue swelling superficial to the left orbit is compatible with a contusion.  The mandible is intact, and the mandibular condyles are properly located.  Poor dentition with multiple missing teeth incidentally noted.  Small mucosal retention cyst or polyp in the inferior aspect of the right maxillary sinus is incidentally noted. Visualized intracranial contents are remarkable for atrophy and what appear to be chronic ischemic changes in the cerebral white matter (similar to prior).  IMPRESSION: 1.  Soft tissue contusion around the left orbit without evidence of acute displaced facial bone fracture or other signs of significant acute trauma.   Original Report Authenticated By: Trudie Reed, M.D.      Assessment/Plan: 51yo with end stage multiple sclerosis s/p fall with rhabdomyolysis after lying in the floor~10 hours. She will need an MRI of head to rule out progression of multiple sclerosis, in addition to remote possibility of stroke. Patient has declined significantly over the past year and it may be time to discuss increased care for her, i.e. Facility.  In the meantime, MRI Brain, orthostatics, therapy consult. She will need to remain NPO until a formal speech eval can be done. Further treatment recommendations following MRI results. Case and plan reviewed with Dr. Noel Christmas.  Job Founds, MBA, MHA [facility] Pager [phone]  Patient was evaluated by me and above clinical assessment and management recommendations were rendered by me, as well.  Venetia Maxon M.D. [facility] [phone]  09/04/2012, 4:17 PM

## 2012-09-04 NOTE — ED Notes (Signed)
Patient transported to X-ray 

## 2012-09-04 NOTE — ED Provider Notes (Signed)
History     CSN: [id number]  Arrival date & time 09/04/12  1144   First MD Initiated Contact with Patient 09/04/12 1209      Chief Complaint  Patient presents with  . Fall  . Weakness    (Consider location/radiation/quality/duration/timing/severity/associated sxs/prior treatment) HPI Comments: Patient with a history of MS, mostly wheelchair-bound, can walk very minimally. She lives at home with a roommate who was not at home last night. She reports standing up and around 10 PM and thinks she lost her balance and fell. She is not able to get back into her wheelchair nor get to the telephone to call for help. She laid on the floor all night and into this morning and told her home aide came and found her. MS was called at that time the patient brought here. She complains of pain to the left side of her face where she fell and hit her head. She does not think she blacked out at the time. She denied any preceding chest pain or shortness of breath prior to falling. She reports that she has a history of falling do to her MS and did not find this to unusual. She complains of generalized achiness and pain to both of her lower extremities which he attributes to laying on the floor. She comes by EMS with c-collar in place. She denies any new numbness or weakness subjectively. She denies any recent fever chills, chest pain, abdominal pain, vomiting, diarrhea, bloody stools.  Patient is a 52 y.o. female presenting with fall and weakness. The history is provided by the patient and the EMS personnel.  Fall Associated symptoms include headaches. Pertinent negatives include no abdominal pain, no nausea and no vomiting.  Weakness Associated symptoms include headaches. Pertinent negatives include no chest pain, no abdominal pain and no shortness of breath.    Past Medical History  Diagnosis Date  . MS (multiple sclerosis)     a. Dx'd late 20's. b. Tx with Novantrone, Tysabri, Copaxone previously.  Marland Kitchen  Hypertension   . Depression   . Glaucoma(365)   . Cardiomyopathy     a.  Echo 04/29/12: Mild LVH, EF 20-25%, mild AI, moderate MR, moderate LAE, mild RAE, mild RVE, moderate TR, PASP 51, small pericardial effusion;   b. probably non-ischemic given multiple chemo-Tx agents used for MS and global LV dysfn on echo  . Chronic systolic heart failure     Past Surgical History  Procedure Laterality Date  . Ablation      uterine  . Cesarean section      Family History  Problem Relation Age of Onset  . Hypertension Mother     History  Substance Use Topics  . Smoking status: Never Smoker   . Smokeless tobacco: Never Used  . Alcohol Use: No    OB History   Grav Para Term Preterm Abortions TAB SAB Ect Mult Living                  Review of Systems  HENT: Negative for neck pain and neck stiffness.   Respiratory: Negative for cough and shortness of breath.   Cardiovascular: Negative for chest pain.  Gastrointestinal: Negative for nausea, vomiting, abdominal pain and diarrhea.  Musculoskeletal: Positive for arthralgias. Negative for back pain.  Neurological: Positive for weakness and headaches. Negative for dizziness and syncope.  All other systems reviewed and are negative.    Allergies  Sulfonamide derivatives and Penicillins  Home Medications   Current Outpatient Rx  Name  Route  Sig  Dispense  Refill  . amantadine (SYMMETREL) 100 MG capsule   Oral   Take 100 mg by mouth 2 (two) times daily.          . carvedilol (COREG) 6.25 MG tablet   Oral   Take 1 tablet (6.25 mg total) by mouth 2 (two) times daily with a meal.   60 tablet   3   . digoxin (LANOXIN) 0.25 MG tablet   Oral   Take 1 tablet (0.25 mg total) by mouth daily.         . furosemide (LASIX) 40 MG tablet   Oral   Take 60 mg by mouth 2 (two) times daily. 80 mg in AM and 40 mg in PM         . gabapentin (NEURONTIN) 600 MG tablet   Oral   Take 600 mg by mouth 3 (three) times daily.          Marland Kitchen  imipramine (TOFRANIL) 50 MG tablet   Oral   Take 100 mg by mouth at bedtime.           . nitrofurantoin (MACRODANTIN) 100 MG capsule   Oral   Take 100 mg by mouth daily.         . potassium chloride SA (K-DUR,KLOR-CON) 20 MEQ tablet   Oral   Take 40 mEq by mouth daily.    1 tablet   0   . solifenacin (VESICARE) 10 MG tablet   Oral   Take 10 mg by mouth 2 (two) times daily.          Marland Kitchen spironolactone (ALDACTONE) 25 MG tablet   Oral   Take 25 mg by mouth every morning.         . temazepam (RESTORIL) 15 MG capsule   Oral   Take 15 mg by mouth at bedtime.         . Travoprost, BAK Free, (TRAVATAN) 0.004 % SOLN ophthalmic solution   Both Eyes   Place 1 drop into both eyes at bedtime.          Marland Kitchen zolpidem (AMBIEN) 5 MG tablet   Oral   Take 5 mg by mouth at bedtime.            BP 191/80  Pulse 84  Temp(Src) 97.6 F (36.4 C) (Oral)  Resp 13  SpO2 100%  Physical Exam  Nursing note and vitals reviewed. Constitutional: She appears well-developed and well-nourished.  HENT:  Head: Normocephalic.    Mouth/Throat: Uvula is midline. Mucous membranes are dry. No oropharyngeal exudate or posterior oropharyngeal edema.  Slight periorbital edema over the left upper eyelid with evidence of contusion in the periorbital region and her left forehead. She denies that she was hit. She reports she fell and struck the left side of her face and the floor.  Eyes: EOM are normal.  Neck: Neck supple. No JVD present. No spinous process tenderness and no muscular tenderness present. Normal range of motion present.  Cardiovascular: Normal rate and regular rhythm.   Pulmonary/Chest: Effort normal. No respiratory distress. She has no wheezes.  Abdominal: Soft. She exhibits no distension. There is no tenderness. There is no rebound and no guarding.  Musculoskeletal: She exhibits no edema.       Lumbar back: She exhibits tenderness. She exhibits no bony tenderness and no deformity.    No deformity to either lower extremity. Range of motion of both hips knees, ankles without significant pain  or tenderness.  Neurological: She is alert.  Skin: Skin is warm.    ED Course  Procedures (including critical care time)  Labs Reviewed  CBC WITH DIFFERENTIAL - Abnormal; Notable for the following:    WBC 10.6 (*)    MCHC 36.5 (*)    RDW 16.2 (*)    Neutrophils Relative 87 (*)    Neutro Abs 9.2 (*)    Lymphocytes Relative 6 (*)    Lymphs Abs 0.6 (*)    All other components within normal limits  COMPREHENSIVE METABOLIC PANEL - Abnormal; Notable for the following:    Potassium 3.3 (*)    CO2 36 (*)    Glucose, Bld 122 (*)    AST 53 (*)    ALT 100 (*)    GFR calc non Af Amer 84 (*)    All other components within normal limits  CK - Abnormal; Notable for the following:    Total CK 2396 (*)    All other components within normal limits  URINALYSIS, MICROSCOPIC ONLY - Abnormal; Notable for the following:    Hgb urine dipstick LARGE (*)    Nitrite POSITIVE (*)    Casts HYALINE CASTS (*)    All other components within normal limits   Dg Chest 2 View  09/04/2012  *RADIOLOGY REPORT*  Clinical Data: Weakness.  History of fall.  CHEST - 2 VIEW  Comparison: Chest x-ray 06/06/2012.  Findings: Lung volumes are low.  No consolidative airspace disease. No pleural effusions.  No suspicious appearing pulmonary nodules or masses are identified.  No pneumothorax.  Pulmonary vasculature is within normal limits.  Heart size is mildly enlarged (unchanged). The patient is rotated to the left on today's exam, resulting in distortion of the mediastinal contours and reduced diagnostic sensitivity and specificity for mediastinal pathology.  IMPRESSION: 1.  Low lung volumes without radiographic evidence of acute cardiopulmonary disease. 2.  Mild cardiomegaly.   Original Report Authenticated By: Trudie Reed, M.D.    Ct Maxillofacial Wo Cm  09/04/2012  *RADIOLOGY REPORT*  Clinical Data: History of  trauma from a fall with injury to the left eye.  CT MAXILLOFACIAL WITHOUT CONTRAST  Technique:  Multidetector CT imaging of the maxillofacial structures was performed. Multiplanar CT image reconstructions were also generated.  Comparison: Head CT 06/06/2012.  Findings: No acute displaced facial bone fractures are identified. Specifically, the left orbit is intact.  The left globe is intact and retrobulbar soft tissues are within normal limits.  A small amount of soft tissue swelling superficial to the left orbit is compatible with a contusion.  The mandible is intact, and the mandibular condyles are properly located.  Poor dentition with multiple missing teeth incidentally noted.  Small mucosal retention cyst or polyp in the inferior aspect of the right maxillary sinus is incidentally noted. Visualized intracranial contents are remarkable for atrophy and what appear to be chronic ischemic changes in the cerebral white matter (similar to prior).  IMPRESSION: 1.  Soft tissue contusion around the left orbit without evidence of acute displaced facial bone fracture or other signs of significant acute trauma.   Original Report Authenticated By: Trudie Reed, M.D.      1. Weakness   2. MS (multiple sclerosis)   3. Myoglobinuria   4. Facial contusion, initial encounter     Saturation on nasal cannula oxygenation is 100% and I interpret this to be adequate.  Likely can be weaned off here in emergency department.  EKG performed at time 11:54, shows  sinus rhythm at a rate of 92. LVH with repoll abnormality noted in diffuse leads. Likely left atrial enlargement. Axis is normal. EKG is similar to that from 06/06/2012.  MDM  Patient with history of MS status post fall. She denies this was a syncopal event. She denied any chest pain. EKG shows no ischemic changes and no significant arrhythmias. Patient is clinically dehydrated with evidence of myoglobinuria and at risk for going into rhabdo. She'll be treated  with IV fluids. I spoke to the [facility] hospitalist to admit the patient to the medical floor for continued hydration.        Gavin Pound. Ghim, MD 09/04/12 1453

## 2012-09-05 DIAGNOSIS — E43 Unspecified severe protein-calorie malnutrition: Secondary | ICD-10-CM | POA: Diagnosis present

## 2012-09-05 DIAGNOSIS — G8220 Paraplegia, unspecified: Secondary | ICD-10-CM | POA: Diagnosis present

## 2012-09-05 DIAGNOSIS — I5022 Chronic systolic (congestive) heart failure: Secondary | ICD-10-CM

## 2012-09-05 DIAGNOSIS — E41 Nutritional marasmus: Secondary | ICD-10-CM

## 2012-09-05 LAB — CBC
HCT: 37.2 % (ref 36.0–46.0)
Hemoglobin: 13.3 g/dL (ref 12.0–15.0)
MCH: 29.4 pg (ref 26.0–34.0)
MCV: 82.1 fL (ref 78.0–100.0)
RBC: 4.53 MIL/uL (ref 3.87–5.11)
WBC: 13.6 10*3/uL — ABNORMAL HIGH (ref 4.0–10.5)

## 2012-09-05 LAB — COMPREHENSIVE METABOLIC PANEL
AST: 82 U/L — ABNORMAL HIGH (ref 0–37)
Albumin: 2.8 g/dL — ABNORMAL LOW (ref 3.5–5.2)
Calcium: 9.1 mg/dL (ref 8.4–10.5)
Chloride: 102 mEq/L (ref 96–112)
Creatinine, Ser: 0.67 mg/dL (ref 0.50–1.10)
Total Bilirubin: 0.8 mg/dL (ref 0.3–1.2)
Total Protein: 6.4 g/dL (ref 6.0–8.3)

## 2012-09-05 LAB — TROPONIN I: Troponin I: <0.3 ng/mL (ref <0.30)

## 2012-09-05 MED ORDER — FUROSEMIDE 40 MG PO TABS
40.0000 mg | ORAL_TABLET | Freq: Two times a day (BID) | ORAL | Status: DC
  Administered 2012-09-05 – 2012-09-07 (×5): 40 mg via ORAL
  Filled 2012-09-05 (×8): qty 1

## 2012-09-05 MED ORDER — POTASSIUM CHLORIDE CRYS ER 20 MEQ PO TBCR
40.0000 meq | EXTENDED_RELEASE_TABLET | Freq: Four times a day (QID) | ORAL | Status: AC
  Administered 2012-09-05 (×2): 40 meq via ORAL
  Filled 2012-09-05 (×2): qty 2

## 2012-09-05 NOTE — Progress Notes (Addendum)
  Comment:  Admitted 09/04/12, s/p fall. Appears to have progression of multiple sclerosis. Spiked temp of 100.7-100.8. At close to midnight. U/A is negative.   Plan:  1. Blood cultures x 2. 2. PCXR.   C. Oti. MD, FACP.   Addendum: Temp now 101. Have started empiric Vanc/Primaxin.

## 2012-09-05 NOTE — Progress Notes (Signed)
Subjective: No new complaints. No improvement in lower extremity function. Patient admits to having cognitive deficits but insists she still able to care for self.  Objective: Current vital signs: BP 137/74  Pulse 102  Temp(Src) 99.2 F (37.3 C) (Axillary)  Resp 18  Ht 5\' 5"  (1.651 m)  Wt 89.9 kg (198 lb 3.1 oz)  BMI 32.98 kg/m2  SpO2 97%  Neurologic Exam: Alert and in no acute distress. Patient is disoriented to the current month as well as correct year. She slightly disoriented to place as well. Pupils were equal and reacted normally to light. Extraocular movements were full and conjugate with minimal horizontal nystagmus on right and left lateral gaze. Visual fields were intact and normal. Face was symmetrical. Speech was moderately dysarthric. Strength of upper extremities was normal proximally and distally. Strength of lower extremities was severely impaired with only residual minimal dorsiflexion and plantarflexion of the feet.  Lab Results: Results for orders placed during the hospital encounter of 09/04/12 (from the past 48 hour(s))  CBC WITH DIFFERENTIAL     Status: Abnormal   Collection Time    09/04/12 12:17 PM      Result Value Range   WBC 10.6 (*) 4.0 - 10.5 K/uL   RBC 4.65  3.87 - 5.11 MIL/uL   Hemoglobin 13.6  12.0 - 15.0 g/dL   HCT 16.1  09.6 - 04.5 %   MCV 80.2  78.0 - 100.0 fL   MCH 29.2  26.0 - 34.0 pg   MCHC 36.5 (*) 30.0 - 36.0 g/dL   RDW 40.9 (*) 81.1 - 91.4 %   Platelets 241  150 - 400 K/uL   Neutrophils Relative 87 (*) 43 - 77 %   Neutro Abs 9.2 (*) 1.7 - 7.7 K/uL   Lymphocytes Relative 6 (*) 12 - 46 %   Lymphs Abs 0.6 (*) 0.7 - 4.0 K/uL   Monocytes Relative 7  3 - 12 %   Monocytes Absolute 0.8  0.1 - 1.0 K/uL   Eosinophils Relative 0  0 - 5 %   Eosinophils Absolute 0.0  0.0 - 0.7 K/uL   Basophils Relative 0  0 - 1 %   Basophils Absolute 0.0  0.0 - 0.1 K/uL  COMPREHENSIVE METABOLIC PANEL     Status: Abnormal   Collection Time    09/04/12  12:17 PM      Result Value Range   Sodium 144  135 - 145 mEq/L   Potassium 3.3 (*) 3.5 - 5.1 mEq/L   Chloride 98  96 - 112 mEq/L   CO2 36 (*) 19 - 32 mEq/L   Glucose, Bld 122 (*) 70 - 99 mg/dL   BUN 13  6 - 23 mg/dL   Creatinine, Ser 7.82  0.50 - 1.10 mg/dL   Calcium 9.7  8.4 - 95.6 mg/dL   Total Protein 7.7  6.0 - 8.3 g/dL   Albumin 3.8  3.5 - 5.2 g/dL   AST 53 (*) 0 - 37 U/L   ALT 100 (*) 0 - 35 U/L   Alkaline Phosphatase 111  39 - 117 U/L   Total Bilirubin 0.4  0.3 - 1.2 mg/dL   GFR calc non Af Amer 84 (*) >90 mL/min   GFR calc Af Amer >90  >90 mL/min   Comment:            The eGFR has been calculated     using the CKD EPI equation.     This calculation has  not been     validated in all clinical     situations.     eGFR's persistently     <90 mL/min signify     possible Chronic Kidney Disease.  CK     Status: Abnormal   Collection Time    09/04/12 12:17 PM      Result Value Range   Total CK 2396 (*) 7 - 177 U/L  URINALYSIS, MICROSCOPIC ONLY     Status: Abnormal   Collection Time    09/04/12  1:29 PM      Result Value Range   Color, Urine YELLOW  YELLOW   APPearance CLEAR  CLEAR   Specific Gravity, Urine 1.012  1.005 - 1.030   pH 7.0  5.0 - 8.0   Glucose, UA NEGATIVE  NEGATIVE mg/dL   Hgb urine dipstick LARGE (*) NEGATIVE   Comment: Repeated to verify   Bilirubin Urine NEGATIVE  NEGATIVE   Ketones, ur NEGATIVE  NEGATIVE mg/dL   Protein, ur NEGATIVE  NEGATIVE mg/dL   Urobilinogen, UA 0.2  0.0 - 1.0 mg/dL   Nitrite POSITIVE (*) NEGATIVE   Leukocytes, UA NEGATIVE  NEGATIVE   WBC, UA 0-2  <3 WBC/hpf   RBC / HPF 0-2  <3 RBC/hpf   Bacteria, UA RARE  RARE   Squamous Epithelial / LPF RARE  RARE   Casts HYALINE CASTS (*) NEGATIVE   Urine-Other AMORPHOUS URATES/PHOSPHATES    TROPONIN I     Status: None   Collection Time    09/04/12  5:15 PM      Result Value Range   Troponin I <0.30  <0.30 ng/mL   Comment:            Due to the release kinetics of cTnI,     a  negative result within the first hours     of the onset of symptoms does not rule out     myocardial infarction with certainty.     If myocardial infarction is still suspected,     repeat the test at appropriate intervals.  CBC     Status: Abnormal   Collection Time    09/04/12  5:16 PM      Result Value Range   WBC 9.1  4.0 - 10.5 K/uL   RBC 4.80  3.87 - 5.11 MIL/uL   Hemoglobin 14.1  12.0 - 15.0 g/dL   HCT 16.1  09.6 - 04.5 %   MCV 82.3  78.0 - 100.0 fL   MCH 29.4  26.0 - 34.0 pg   MCHC 35.7  30.0 - 36.0 g/dL   RDW 40.9 (*) 81.1 - 91.4 %   Platelets 228  150 - 400 K/uL  CREATININE, SERUM     Status: None   Collection Time    09/04/12  5:16 PM      Result Value Range   Creatinine, Ser 0.79  0.50 - 1.10 mg/dL   GFR calc non Af Amer >90  >90 mL/min   GFR calc Af Amer >90  >90 mL/min   Comment:            The eGFR has been calculated     using the CKD EPI equation.     This calculation has not been     validated in all clinical     situations.     eGFR's persistently     <90 mL/min signify     possible Chronic Kidney Disease.  TROPONIN I  Status: None   Collection Time    09/04/12  9:49 PM      Result Value Range   Troponin I <0.30  <0.30 ng/mL   Comment:            Due to the release kinetics of cTnI,     a negative result within the first hours     of the onset of symptoms does not rule out     myocardial infarction with certainty.     If myocardial infarction is still suspected,     repeat the test at appropriate intervals.  TROPONIN I     Status: None   Collection Time    09/05/12  7:00 AM      Result Value Range   Troponin I <0.30  <0.30 ng/mL   Comment:            Due to the release kinetics of cTnI,     a negative result within the first hours     of the onset of symptoms does not rule out     myocardial infarction with certainty.     If myocardial infarction is still suspected,     repeat the test at appropriate intervals.  COMPREHENSIVE METABOLIC  PANEL     Status: Abnormal   Collection Time    09/05/12  7:00 AM      Result Value Range   Sodium 143  135 - 145 mEq/L   Potassium 3.3 (*) 3.5 - 5.1 mEq/L   Chloride 102  96 - 112 mEq/L   CO2 30  19 - 32 mEq/L   Glucose, Bld 151 (*) 70 - 99 mg/dL   BUN 11  6 - 23 mg/dL   Creatinine, Ser 4.09  0.50 - 1.10 mg/dL   Calcium 9.1  8.4 - 81.1 mg/dL   Total Protein 6.4  6.0 - 8.3 g/dL   Albumin 2.8 (*) 3.5 - 5.2 g/dL   AST 82 (*) 0 - 37 U/L   ALT 81 (*) 0 - 35 U/L   Alkaline Phosphatase 95  39 - 117 U/L   Total Bilirubin 0.8  0.3 - 1.2 mg/dL   GFR calc non Af Amer >90  >90 mL/min   GFR calc Af Amer >90  >90 mL/min   Comment:            The eGFR has been calculated     using the CKD EPI equation.     This calculation has not been     validated in all clinical     situations.     eGFR's persistently     <90 mL/min signify     possible Chronic Kidney Disease.  CBC     Status: Abnormal   Collection Time    09/05/12  7:00 AM      Result Value Range   WBC 13.6 (*) 4.0 - 10.5 K/uL   RBC 4.53  3.87 - 5.11 MIL/uL   Hemoglobin 13.3  12.0 - 15.0 g/dL   HCT 91.4  78.2 - 95.6 %   MCV 82.1  78.0 - 100.0 fL   MCH 29.4  26.0 - 34.0 pg   MCHC 35.8  30.0 - 36.0 g/dL   RDW 21.3 (*) 08.6 - 57.8 %   Platelets 214  150 - 400 K/uL    Studies/Results: Dg Chest 2 View  09/04/2012  *RADIOLOGY REPORT*  Clinical Data: Weakness.  History of fall.  CHEST - 2 VIEW  Comparison:  Chest x-ray 06/06/2012.  Findings: Lung volumes are low.  No consolidative airspace disease. No pleural effusions.  No suspicious appearing pulmonary nodules or masses are identified.  No pneumothorax.  Pulmonary vasculature is within normal limits.  Heart size is mildly enlarged (unchanged). The patient is rotated to the left on today's exam, resulting in distortion of the mediastinal contours and reduced diagnostic sensitivity and specificity for mediastinal pathology.  IMPRESSION: 1.  Low lung volumes without radiographic evidence of  acute cardiopulmonary disease. 2.  Mild cardiomegaly.   Original Report Authenticated By: Trudie Reed, M.D.    Mr Laqueta Jean Wo Contrast  09/05/2012  *RADIOLOGY REPORT*  Clinical Data: History multiple sclerosis, cardiomyopathy and hypertension. Became weak and fell. Down possibly for 10 hours.  MRI HEAD WITHOUT AND WITH CONTRAST  Technique:  Multiplanar, multiecho pulse sequences of the brain and surrounding structures were obtained according to standard protocol without and with intravenous contrast  Contrast: 20mL MULTIHANCE GADOBENATE DIMEGLUMINE 529 MG/ML IV SOLN  Comparison: 06/01/2006.  Findings: Diffuse patchy white matter type changes most notable in the periventricular region although also seen in the subcortical region.  The number and size of white matter lesions have progressed since the prior examination indicating  progressive multiple sclerosis.  Areas which appear largest include the right parietal lobe and left mid frontal lobe periventricular area. These regions do not have significant associated mass effect as may be seen with PML (as may be seen as a complication of MS treatment). Recommend confirming stability with MR monitoring.  Involvement of the right motor strip by MS plaques once again noted causing T2 shine through on the diffusion sequence.  None of these MS plaques demonstrate restricted motion or enhancement as may be seen with acute areas of demyelination.  No acute infarct.  Right pituitary 1.2 x 1.1 x 1.2 cm mass. With the aid in retrospection, this was barely visualized on the prior examination when this measured 1.2 x 0.7 x 1.1 cm. Minimal suprasellar extension and extension towards the right cavernous sinus.  No compression of the undersurface of the right optic nerves/optic chiasm. Infundibulum is deviated to the left.  This may represent a pituitary adenoma.  Mild exophthalmos otherwise orbital wall structures unremarkable.  No intracranial hemorrhage.  Posterior left  parafalcine and 4.2 mm focal calcification versus tiny meningioma without associated mass effect.  Findings without change.  Global atrophy without hydrocephalus.  Minimal paranasal sinus mucosal thickening with polypoid opacification inferior aspect right maxillary sinus.  Major intracranial vascular structures are patent.  IMPRESSION: Diffuse patchy white matter type changes most notable in the periventricular region although also seen in the subcortical region.  The number and size of white matter lesions have progressed since the prior examination indicating  progressive multiple sclerosis.  Areas which appear largest include the right parietal lobe and left mid frontal lobe periventricular area. These regions do not have significant associated mass effect as may be seen with PML (as may be seen as a complication of MS treatment). Recommend confirming stability with MR monitoring.  Involvement of the right motor strip by MS plaques once again noted causing T2 shine through on the diffusion sequence.  No MRl findings of acute demyelination.  No acute infarct.  Right pituitary 1.2 x 1.1 x 1.2 cm mass. With the aid in retrospection, this was barely visualized on the prior examination when this measured 1.2 x 0.7 x 1.1 cm.  This may represent a pituitary adenoma.  No intracranial hemorrhage.  Posterior left  parafalcine and 4.2 mm focal calcification versus tiny meningioma without associated mass effect without change.  Global atrophy without hydrocephalus.   Original Report Authenticated By: Lacy Duverney, M.D.    Ct Maxillofacial Wo Cm  09/04/2012  *RADIOLOGY REPORT*  Clinical Data: History of trauma from a fall with injury to the left eye.  CT MAXILLOFACIAL WITHOUT CONTRAST  Technique:  Multidetector CT imaging of the maxillofacial structures was performed. Multiplanar CT image reconstructions were also generated.  Comparison: Head CT 06/06/2012.  Findings: No acute displaced facial bone fractures are identified.  Specifically, the left orbit is intact.  The left globe is intact and retrobulbar soft tissues are within normal limits.  A small amount of soft tissue swelling superficial to the left orbit is compatible with a contusion.  The mandible is intact, and the mandibular condyles are properly located.  Poor dentition with multiple missing teeth incidentally noted.  Small mucosal retention cyst or polyp in the inferior aspect of the right maxillary sinus is incidentally noted. Visualized intracranial contents are remarkable for atrophy and what appear to be chronic ischemic changes in the cerebral white matter (similar to prior).  IMPRESSION: 1.  Soft tissue contusion around the left orbit without evidence of acute displaced facial bone fracture or other signs of significant acute trauma.   Original Report Authenticated By: Trudie Reed, M.D.     Medications:  I have reviewed the patient's current medications. Scheduled: . amantadine  100 mg Oral BID  . carvedilol  6.25 mg Oral BID WC  . darifenacin  7.5 mg Oral Daily  . digoxin  0.25 mg Oral Daily  . furosemide  40 mg Oral BID  . gabapentin  600 mg Oral TID  . heparin  5,000 Units Subcutaneous Q8H  . imipramine  100 mg Oral QHS  . potassium chloride  40 mEq Oral Q6H  . spironolactone  12.5 mg Oral q morning - 10a  . temazepam  15 mg Oral QHS  . Travoprost (BAK Free)  1 drop Both Eyes QHS  . zolpidem  5 mg Oral QHS   RUE:[id number] injection  Assessment/Plan: Probable MS exacerbation as etiology for patient's presentation with marked deterioration of lower extremity function and strength, with prolonged immobility and subsequent rhabdomyolysis. She appears to have significant cognitive deficits as well. Patient reportedly just completed a course of steroid treatment which was of no benefit clinically. It is also doubtful that a repeat course of steroids would be of benefit.  It's clear that this point that she cannot live alone and perform  activities of daily living and will need placement in a long-term care facility. Recommend continued physical and occupational therapy intervention as well and is speech therapy evaluation of speech and swallowing.  We will continue to follow this patient with you.  C.R. Roseanne Reno, MD [facility] [phone]  09/05/2012  6:12 PM

## 2012-09-05 NOTE — Progress Notes (Signed)
INITIAL NUTRITION ASSESSMENT  DOCUMENTATION CODES Per approved criteria  -Obesity Unspecified   INTERVENTION: 1. Snack BID   2. RD will continue to follow    NUTRITION DIAGNOSIS: Inadequate oral intake  related to decreased ability to prepare meals as evidenced by weight loss.   Goal: PO intake to meet >/=90% estimated nutrition needs  Monitor:  PO intake, weight trends, I/O's, labs   Reason for Assessment: consult  52 y.o. female  Admitting Dx: Fall  ASSESSMENT: Pt admitted after several recent falls at home. Pt with hx of MS, lives at home alone and has help from an aid daily.   Pt reports that she has had a normal appetite, eats 2 meals daily that are prepared by her aid. Denies any weight loss. Per weight hx pt weight has been trending down since last November. 23 lb weight loss, 10% weight loss in less then 6 months, severe weight loss. Pt with increasing falls, expect some muscle wasting with weight loss. Likely some level of malnutrition, but unable to dx at this time.   Pt does not like Ensure products, is willing to have snacks BID.   Height: Ht Readings from Last 1 Encounters:  09/04/12 5\' 5"  (1.651 m)    Weight: Wt Readings from Last 1 Encounters:  09/05/12 198 lb 3.1 oz (89.9 kg)    Ideal Body Weight: 125 lbs   % Ideal Body Weight: 158%  Wt Readings from Last 10 Encounters:  09/05/12 198 lb 3.1 oz (89.9 kg)  08/27/12 205 lb 4 oz (93.101 kg)  08/11/12 203 lb 4 oz (92.194 kg)  07/10/12 208 lb (94.348 kg)  06/10/12 200 lb (90.719 kg)  06/05/12 200 lb (90.719 kg)  05/22/12 221 lb (100.245 kg)  05/15/12 219 lb (99.338 kg)  05/01/12 214 lb 1.1 oz (97.1 kg)    Usual Body Weight: 221 lbs  % Usual Body Weight: 89%  BMI:  Body mass index is 32.98 kg/(m^2). Obesity class 1  Estimated Nutritional Needs: Kcal: 1700-1900 Protein: 70-80 gm Fluid: 1.7-1.9 L  Skin: skin tear on buttock  Diet Order: Dysphagia  EDUCATION NEEDS: -No education needs  identified at this time   Intake/Output Summary (Last 24 hours) at 09/05/12 1223 Last data filed at 09/05/12 0910  Gross per 24 hour  Intake   1000 ml  Output   1000 ml  Net      0 ml    Last BM: PTA    Labs:   Recent Labs Lab 09/04/12 1217 09/04/12 1716 09/05/12 0700  NA 144  --  143  K 3.3*  --  3.3*  CL 98  --  102  CO2 36*  --  30  BUN 13  --  11  CREATININE 0.81 0.79 0.67  CALCIUM 9.7  --  9.1  GLUCOSE 122*  --  151*    CBG (last 3)  No results found for this basename: GLUCAP,  in the last 72 hours  Scheduled Meds: . amantadine  100 mg Oral BID  . carvedilol  6.25 mg Oral BID WC  . darifenacin  7.5 mg Oral Daily  . digoxin  0.25 mg Oral Daily  . furosemide  40 mg Oral BID  . gabapentin  600 mg Oral TID  . heparin  5,000 Units Subcutaneous Q8H  . imipramine  100 mg Oral QHS  . potassium chloride  40 mEq Oral Q6H  . spironolactone  12.5 mg Oral q morning - 10a  . temazepam  15 mg Oral QHS  . Travoprost (BAK Free)  1 drop Both Eyes QHS  . zolpidem  5 mg Oral QHS    Continuous Infusions:   Past Medical History  Diagnosis Date  . MS (multiple sclerosis)     a. Dx'd late 20's. b. Tx with Novantrone, Tysabri, Copaxone previously.  Marland Kitchen Hypertension   . Depression   . Glaucoma(365)   . Cardiomyopathy     a.  Echo 04/29/12: Mild LVH, EF 20-25%, mild AI, moderate MR, moderate LAE, mild RAE, mild RVE, moderate TR, PASP 51, small pericardial effusion;   b. probably non-ischemic given multiple chemo-Tx agents used for MS and global LV dysfn on echo  . Chronic systolic heart failure   . CHF (congestive heart failure)   . Shortness of breath   . Neuromuscular disorder     MS    Past Surgical History  Procedure Laterality Date  . Ablation      uterine  . Cesarean section      Clarene Duke RD, LDN Pager [phone] After Hours pager [phone]

## 2012-09-05 NOTE — Progress Notes (Signed)
Per Np hold all of pt's po medications at this time

## 2012-09-05 NOTE — Plan of Care (Signed)
Problem: Phase II Progression Outcomes Goal: Walk in hall or up in chair TID Outcome: Not Progressing Unable to tolerate OOB 2* pain

## 2012-09-05 NOTE — Evaluation (Signed)
Physical Therapy Evaluation Patient Details Name: Shannon Garrett MRN: [id number] DOB: May 01, 1961 Today's Date: 09/05/2012 Time: 1130-1150 PT Time Calculation (min): 20 min  PT Assessment / Plan / Recommendation Clinical Impression  Pt is a pleasanat 52 y.o. female with h/o MS admitted after a fall during which she was on the floor for 10 hours. Pt had an aide that assisted her 8* per day. Pt primarily was using motorized WC at home, but reports she did a little walking. At present she is having significant BLE pain and cannot tolerate supine to sit. BLEs very weak, RLE 0/5, LLE 1/5 at hip, 0/5 knee/ankle. Will need SNF upon DC. She would benefit from acute PT to maximize safety and independence with mobility.     PT Assessment  Patient needs continued PT services    Follow Up Recommendations  SNF    Does the patient have the potential to tolerate intense rehabilitation      Barriers to Discharge        Equipment Recommendations  None recommended by PT    Recommendations for Other Services OT consult   Frequency Min 3X/week    Precautions / Restrictions Precautions Precautions: Fall Restrictions Weight Bearing Restrictions: No   Pertinent Vitals/Pain *8/10 BLEs  RN notified**      Mobility  Bed Mobility Supine to Sit: 1: +1 Total assist Details for Bed Mobility Assistance: attempted supine to sit, 1/2 way to sitting pt reported she was in too much pain to continue Transfers Transfers: Not assessed Details for Transfer Assistance: will need mechanical lift Ambulation/Gait Ambulation/Gait Assistance: Not tested (comment)    Exercises Total Joint Exercises Ankle Circles/Pumps: PROM;Both;5 reps Heel Slides: PROM;Both;5 reps Hip ABduction/ADduction: PROM;Both;5 reps   PT Diagnosis: Generalized weakness  PT Problem List: Decreased strength;Decreased activity tolerance;Decreased mobility;Decreased balance;Pain;Obesity;Decreased cognition PT Treatment Interventions:  Therapeutic activities;Functional mobility training;Balance training;Therapeutic exercise;Patient/family education   PT Goals Acute Rehab PT Goals PT Goal Formulation: With patient Time For Goal Achievement: 09/19/12 Potential to Achieve Goals: Fair Pt will go Supine/Side to Sit: with mod assist PT Goal: Supine/Side to Sit - Progress: Goal set today Pt will go Sit to Stand: with mod assist PT Goal: Sit to Stand - Progress: Goal set today Pt will Transfer Bed to Chair/Chair to Bed: with mod assist PT Transfer Goal: Bed to Chair/Chair to Bed - Progress: Goal set today Pt will Perform Home Exercise Program: with min assist PT Goal: Perform Home Exercise Program - Progress: Goal set today  Visit Information  Last PT Received On: 09/05/12 Assistance Needed: +2 Reason Eval/Treat Not Completed: Pain limiting ability to participate    Subjective Data  Subjective: I can get up, I'm just a little weak right now.  Patient Stated Goal: to be able to get up to [facility]   Prior Functioning  Home Living Lives With: Alone (housemate there only during thenite) Available Help at Discharge: Personal care attendant (7 days/wk 8 hrs per day.) Type of Home: House Home Access: Level entry Home Layout: One level Bathroom Shower/Tub: Engineer, manufacturing systems: Handicapped height Bathroom Accessibility: No Home Adaptive Equipment: Walker - rolling;Wheelchair - powered;Bedside commode/3-in-1;Tub transfer bench Prior Function Level of Independence: Independent with assistive device(s);Needs assistance (insure of level. pt inconsistent) Needs Assistance: Bathing;Dressing;Toileting;Meal Prep;Light Housekeeping;Transfers Bath: Minimal Dressing: Moderate Toileting: Moderate Meal Prep: Total Light Housekeeping: Total Transfer Assistance: min A to S Able to Take Stairs?: No Driving: No Communication Communication: No difficulties Dominant Hand: Right    Cognition  Cognition Overall Cognitive  Status: No family/caregiver present to determine baseline cognitive functioning Arousal/Alertness: Awake/alert Orientation Level: Appears intact for tasks assessed Behavior During Session: [facility] for tasks performed Cognition - Other Comments: appears impaired. decreased safety/judgement, forgetful    Extremity/Trunk Assessment Right Upper Extremity Assessment RUE ROM/Strength/Tone: Deficits RUE ROM/Strength/Tone Deficits: limitedAROM shoulder. unable to complete any shoulder movement. elbow WFL for ROM 3+/5 strength. Hand 3/5. poor in hand manipulation skills RUE Sensation: Deficits RUE Coordination: Deficits RUE Coordination Deficits: diffiuclty holding objects. frequent drops Left Upper Extremity Assessment LUE ROM/Strength/Tone: Deficits LUE ROM/Strength/Tone Deficits: same as R Right Lower Extremity Assessment RLE ROM/Strength/Tone: Deficits (unable to elicit any AROM BLE) RLE ROM/Strength/Tone Deficits: no AROM noted RLE -pt noted pain with PROM Left Lower Extremity Assessment LLE ROM/Strength/Tone: Deficits LLE ROM/Strength/Tone Deficits: trace AROM of 1/5 for hip ABD/ADD, 0/5 ankle DF, 0/5 knee -pt reports pain with PROM, esp at knee Trunk Assessment Trunk Assessment: Other exceptions Trunk Exceptions: NT   Balance    End of Session PT - End of Session Activity Tolerance: Patient limited by pain Patient left: in bed;with bed alarm set;with call bell/phone within reach Nurse Communication: Mobility status;Need for lift equipment  GP     Tamala Ser 09/05/2012, 12:03 PM [phone]

## 2012-09-05 NOTE — Evaluation (Signed)
Clinical/Bedside Swallow Evaluation Patient Details  Name: Shannon Garrett MRN: [id number] Date of Birth: 1961-01-22  Today's Date: 09/05/2012 Time: 1020-1051 SLP Time Calculation (min): 31 min  Past Medical History:  Past Medical History  Diagnosis Date  . MS (multiple sclerosis)     a. Dx'd late 20's. b. Tx with Novantrone, Tysabri, Copaxone previously.  Marland Kitchen Hypertension   . Depression   . Glaucoma(365)   . Cardiomyopathy     a.  Echo 04/29/12: Mild LVH, EF 20-25%, mild AI, moderate MR, moderate LAE, mild RAE, mild RVE, moderate TR, PASP 51, small pericardial effusion;   b. probably non-ischemic given multiple chemo-Tx agents used for MS and global LV dysfn on echo  . Chronic systolic heart failure   . CHF (congestive heart failure)   . Shortness of breath   . Neuromuscular disorder     MS   Past Surgical History:  Past Surgical History  Procedure Laterality Date  . Ablation      uterine  . Cesarean section     HPI:  52 year old female admitted 09/04/12 after dizziness and fall from wheelchair at home. Found down after 10 hours on the floor. Pt reports intermittent difficulty swallowing. When questioned further, pt indicates food feels like it gets stuck.   Assessment / Plan / Recommendation Clinical Impression  Oral motor assessment appears within functional limits. Oral cavity was very dry, lips chapped prior to evaluation. Oral care provided.  No overt s/s aspiration observed during this assessment. Pt is impulsive when self-feeding. She reports intermittent globus sensation, raising suspicion for esophageal issues.  Will recommend soft diet with chopped meats (due to impulsivity), thin liquids, meds whole with liquid. Encourage full supervision due to impulsivity and significant cognitive impairment.  ST to follow briefly for diet tolerance.      Aspiration Risk  Mild    Diet Recommendation Dysphagia 3 (Mechanical Soft);Thin liquid (chop meats)   Liquid Administration via:  Cup;Straw Medication Administration: Whole meds with liquid Supervision: Staff feed patient;Full supervision/cueing for compensatory strategies Compensations: Slow rate;Small sips/bites Postural Changes and/or Swallow Maneuvers: Seated upright 90 degrees;Upright 30-60 min after meal    Other  Recommendations Oral Care Recommendations: Oral care QID;Staff/trained caregiver to provide oral care   Follow Up Recommendations  24 hour supervision/assistance;Skilled Nursing facility    Frequency and Duration min 1 x/week  1 week   Pertinent Vitals/Pain Pt reports pain in her right leg.  RN notified.    SLP Swallow Goals Patient will consume recommended diet without observed clinical signs of aspiration with: Moderate assistance Swallow Study Goal #1 - Progress: Progressing toward goal Patient will utilize recommended strategies during swallow to increase swallowing safety with: Moderate assistance Swallow Study Goal #2 - Progress: Progressing toward goal   Swallow Study Prior Functional Status   Lives at home alone, has home health assistance.    General Date of Onset: 09/04/12 HPI: 52 year old female admitted 09/04/12 after dizziness and fall from wheelchair at home. Found down after 10 hours on the floor. Pt reports intermittent difficulty swallowing. When questioned further, pt indicates food feels like it gets stuck. Type of Study: Bedside swallow evaluation Diet Prior to this Study: NPO Temperature Spikes Noted: No Respiratory Status: Room air History of Recent Intubation: No Behavior/Cognition: Alert;Cooperative;Doesn't follow directions;Confused;Impulsive;Distractible;Requires cueing;Decreased sustained attention Oral Cavity - Dentition: Adequate natural dentition Self-Feeding Abilities: Needs assist Patient Positioning: Upright in bed Baseline Vocal Quality: Clear Volitional Cough: Weak Volitional Swallow: Able to elicit  Oral/Motor/Sensory Function Overall Oral  Motor/Sensory Function: Appears within functional limits for tasks assessed   Ice Chips Ice chips: Within functional limits Presentation: Spoon   Thin Liquid Thin Liquid: Within functional limits Presentation: Cup;Self Fed;Straw    Nectar Thick Nectar Thick Liquid: Not tested   Honey Thick Honey Thick Liquid: Not tested   Puree Puree: Within functional limits Presentation: Spoon;Self Fed   Solid    Solid: Within functional limits Presentation: Self Fed Other Comments: Pt tends to be very impulsive when self-feeding      Celia B. Murvin Natal [facility], CCC-SLP [phone] Leigh Aurora 09/05/2012,11:06 AM

## 2012-09-05 NOTE — Progress Notes (Signed)
[facility] PROGRESS NOTE  Shannon Garrett WUJ:[id number] DOB: March 24, 1961 DOA: 09/04/2012 PCP: Jeri Cos, MD  HPI/Subjective: Denies any complaints, patient is cognitively him per and not consistently answering questions   Assessment/Plan:  Fall -Unclear etiology, patient is very poor historian. -Was recently started on digoxin, she is on Lasix and Aldactone as well as Coreg.  -This is might be orthostatic hypotension secondary to the CHF medications. -She is on telemetry, her ejection fraction is less than 30%, cannot rule out tachyarrhythmias.  Multiple sclerosis -Patient has progressive relapsing MS, she has marked decline in her functional status over the past year. -PT/OT evaluated the patient and recommended SNF. -Patient is on Symmetrel 100 mg BID. We will continue urology is evaluating the patient. -Patient had an MRI showed extensive white patches consistent with MS, no acute strokes  Chronic CHF/nonischemic cardiomyopathy -Seems to have severe combined chronic CHF with LVEF of 20-25%, she does not have AICD. -She is on Lasix, Aldactone, Coreg and lisinopril. Digoxin added recently -Patient appears to be euvolemic, no dyspnea orthopnea. -Lasix was held yesterday, she was given IV fluids because of obvious dehydration, stop IV fluids and restart diuretics.  Functional status -As mentioned above evaluated by PT/OT recommended SNF placement. -Also seen by SLP and recommended dysphagia 3 with the liquids. -Patient seems to have some cognitive impairment.  Code Status: Full code Family Communication: Discussed with the family, can access family Disposition Plan: Inpatient, likely to be discharged to rehabilitation facility   Consultants:  Neurology  Procedures:  None  Antibiotics:  None   Objective: Filed Vitals:   09/04/12 1624 09/04/12 1625 09/04/12 2149 09/05/12 0436  BP: 157/64 156/75 146/91 138/75  Pulse: 81 88 97 111  Temp:   98.2 F (36.8  C) 100 F (37.8 C)  TempSrc:   Oral Oral  Resp: 18 18 16 20   Height:      Weight:    89.9 kg (198 lb 3.1 oz)  SpO2: 100% 100% 100% 96%    Intake/Output Summary (Last 24 hours) at 09/05/12 1148 Last data filed at 09/05/12 0910  Gross per 24 hour  Intake   1000 ml  Output   1000 ml  Net      0 ml   Filed Weights   09/04/12 1600 09/05/12 0436  Weight: 91.4 kg (201 lb 8 oz) 89.9 kg (198 lb 3.1 oz)    Exam:  General: Alert and awake, oriented x3, not in any acute distress. HEENT: anicteric sclera, pupils reactive to light and accommodation, EOMI CVS: S1-S2 clear, no murmur rubs or gallops Chest: clear to auscultation bilaterally, no wheezing, rales or rhonchi Abdomen: soft nontender, nondistended, normal bowel sounds, no organomegaly Extremities: no cyanosis, clubbing or edema noted bilaterally Neuro: Cranial nerves II-XII intact, no focal neurological deficits  Data Reviewed: Basic Metabolic Panel:  Recent Labs Lab 09/04/12 1217 09/04/12 1716 09/05/12 0700  NA 144  --  143  K 3.3*  --  3.3*  CL 98  --  102  CO2 36*  --  30  GLUCOSE 122*  --  151*  BUN 13  --  11  CREATININE 0.81 0.79 0.67  CALCIUM 9.7  --  9.1   Liver Function Tests:  Recent Labs Lab 09/04/12 1217 09/05/12 0700  AST 53* 82*  ALT 100* 81*  ALKPHOS 111 95  BILITOT 0.4 0.8  PROT 7.7 6.4  ALBUMIN 3.8 2.8*   No results found for this basename: LIPASE, AMYLASE,  in the last 168  hours No results found for this basename: AMMONIA,  in the last 168 hours CBC:  Recent Labs Lab 09/04/12 1217 09/04/12 1716 09/05/12 0700  WBC 10.6* 9.1 13.6*  NEUTROABS 9.2*  --   --   HGB 13.6 14.1 13.3  HCT 37.3 39.5 37.2  MCV 80.2 82.3 82.1  PLT 241 228 214   Cardiac Enzymes:  Recent Labs Lab 09/04/12 1217 09/04/12 1715 09/04/12 2149 09/05/12 0700  CKTOTAL 2396*  --   --   --   TROPONINI  --  <0.30 <0.30 <0.30   BNP (last 3 results)  Recent Labs  04/28/12 1658 04/30/12 0455  PROBNP  16405.0* 10618.0*   CBG: No results found for this basename: GLUCAP,  in the last 168 hours  No results found for this or any previous visit (from the past 240 hour(s)).   Studies: Dg Chest 2 View  09/04/2012  *RADIOLOGY REPORT*  Clinical Data: Weakness.  History of fall.  CHEST - 2 VIEW  Comparison: Chest x-ray 06/06/2012.  Findings: Lung volumes are low.  No consolidative airspace disease. No pleural effusions.  No suspicious appearing pulmonary nodules or masses are identified.  No pneumothorax.  Pulmonary vasculature is within normal limits.  Heart size is mildly enlarged (unchanged). The patient is rotated to the left on today's exam, resulting in distortion of the mediastinal contours and reduced diagnostic sensitivity and specificity for mediastinal pathology.  IMPRESSION: 1.  Low lung volumes without radiographic evidence of acute cardiopulmonary disease. 2.  Mild cardiomegaly.   Original Report Authenticated By: Trudie Reed, M.D.    Mr Laqueta Jean Wo Contrast  09/05/2012  *RADIOLOGY REPORT*  Clinical Data: History multiple sclerosis, cardiomyopathy and hypertension. Became weak and fell. Down possibly for 10 hours.  MRI HEAD WITHOUT AND WITH CONTRAST  Technique:  Multiplanar, multiecho pulse sequences of the brain and surrounding structures were obtained according to standard protocol without and with intravenous contrast  Contrast: 20mL MULTIHANCE GADOBENATE DIMEGLUMINE 529 MG/ML IV SOLN  Comparison: 06/01/2006.  Findings: Diffuse patchy white matter type changes most notable in the periventricular region although also seen in the subcortical region.  The number and size of white matter lesions have progressed since the prior examination indicating  progressive multiple sclerosis.  Areas which appear largest include the right parietal lobe and left mid frontal lobe periventricular area. These regions do not have significant associated mass effect as may be seen with PML (as may be seen as a  complication of MS treatment). Recommend confirming stability with MR monitoring.  Involvement of the right motor strip by MS plaques once again noted causing T2 shine through on the diffusion sequence.  None of these MS plaques demonstrate restricted motion or enhancement as may be seen with acute areas of demyelination.  No acute infarct.  Right pituitary 1.2 x 1.1 x 1.2 cm mass. With the aid in retrospection, this was barely visualized on the prior examination when this measured 1.2 x 0.7 x 1.1 cm. Minimal suprasellar extension and extension towards the right cavernous sinus.  No compression of the undersurface of the right optic nerves/optic chiasm. Infundibulum is deviated to the left.  This may represent a pituitary adenoma.  Mild exophthalmos otherwise orbital wall structures unremarkable.  No intracranial hemorrhage.  Posterior left parafalcine and 4.2 mm focal calcification versus tiny meningioma without associated mass effect.  Findings without change.  Global atrophy without hydrocephalus.  Minimal paranasal sinus mucosal thickening with polypoid opacification inferior aspect right maxillary sinus.  Major intracranial  vascular structures are patent.  IMPRESSION: Diffuse patchy white matter type changes most notable in the periventricular region although also seen in the subcortical region.  The number and size of white matter lesions have progressed since the prior examination indicating  progressive multiple sclerosis.  Areas which appear largest include the right parietal lobe and left mid frontal lobe periventricular area. These regions do not have significant associated mass effect as may be seen with PML (as may be seen as a complication of MS treatment). Recommend confirming stability with MR monitoring.  Involvement of the right motor strip by MS plaques once again noted causing T2 shine through on the diffusion sequence.  No MRl findings of acute demyelination.  No acute infarct.  Right pituitary  1.2 x 1.1 x 1.2 cm mass. With the aid in retrospection, this was barely visualized on the prior examination when this measured 1.2 x 0.7 x 1.1 cm.  This may represent a pituitary adenoma.  No intracranial hemorrhage.  Posterior left parafalcine and 4.2 mm focal calcification versus tiny meningioma without associated mass effect without change.  Global atrophy without hydrocephalus.   Original Report Authenticated By: Lacy Duverney, M.D.    Ct Maxillofacial Wo Cm  09/04/2012  *RADIOLOGY REPORT*  Clinical Data: History of trauma from a fall with injury to the left eye.  CT MAXILLOFACIAL WITHOUT CONTRAST  Technique:  Multidetector CT imaging of the maxillofacial structures was performed. Multiplanar CT image reconstructions were also generated.  Comparison: Head CT 06/06/2012.  Findings: No acute displaced facial bone fractures are identified. Specifically, the left orbit is intact.  The left globe is intact and retrobulbar soft tissues are within normal limits.  A small amount of soft tissue swelling superficial to the left orbit is compatible with a contusion.  The mandible is intact, and the mandibular condyles are properly located.  Poor dentition with multiple missing teeth incidentally noted.  Small mucosal retention cyst or polyp in the inferior aspect of the right maxillary sinus is incidentally noted. Visualized intracranial contents are remarkable for atrophy and what appear to be chronic ischemic changes in the cerebral white matter (similar to prior).  IMPRESSION: 1.  Soft tissue contusion around the left orbit without evidence of acute displaced facial bone fracture or other signs of significant acute trauma.   Original Report Authenticated By: Trudie Reed, M.D.     Scheduled Meds: . amantadine  100 mg Oral BID  . carvedilol  6.25 mg Oral BID WC  . darifenacin  7.5 mg Oral Daily  . digoxin  0.25 mg Oral Daily  . gabapentin  600 mg Oral TID  . heparin  5,000 Units Subcutaneous Q8H  .  imipramine  100 mg Oral QHS  . potassium chloride  40 mEq Oral Q6H  . spironolactone  12.5 mg Oral q morning - 10a  . temazepam  15 mg Oral QHS  . Travoprost (BAK Free)  1 drop Both Eyes QHS  . zolpidem  5 mg Oral QHS   Continuous Infusions: . sodium chloride 50 mL/hr at 09/04/12 2305    Active Problems:   ANEMIA, CHRONIC   MULTIPLE SCLEROSIS, PROGRESSIVE/RELAPSING   HYPERTENSION, BENIGN ESSENTIAL   Other primary cardiomyopathies   Chronic systolic heart failure   Fall   EKG abnormalities   Severe malnutrition    Time spent: 35 minutes    [facility]  [facility] Pager [phone]. If 7PM-7AM, please contact night-coverage at [email or web address], password [facility] 09/05/2012, 11:48 AM  LOS: 1 day

## 2012-09-06 ENCOUNTER — Inpatient Hospital Stay (HOSPITAL_COMMUNITY): Payer: PRIVATE HEALTH INSURANCE

## 2012-09-06 DIAGNOSIS — G35 Multiple sclerosis: Principal | ICD-10-CM

## 2012-09-06 DIAGNOSIS — G8220 Paraplegia, unspecified: Secondary | ICD-10-CM

## 2012-09-06 DIAGNOSIS — R509 Fever, unspecified: Secondary | ICD-10-CM

## 2012-09-06 LAB — URINE MICROSCOPIC-ADD ON

## 2012-09-06 LAB — URINE CULTURE
Colony Count: NO GROWTH
Culture: NO GROWTH

## 2012-09-06 LAB — URINALYSIS, ROUTINE W REFLEX MICROSCOPIC
Bilirubin Urine: NEGATIVE
Glucose, UA: NEGATIVE mg/dL
Ketones, ur: NEGATIVE mg/dL
Nitrite: NEGATIVE
Specific Gravity, Urine: 1.01 (ref 1.005–1.030)
pH: 7.5 (ref 5.0–8.0)

## 2012-09-06 LAB — CBC
HCT: 33.2 % — ABNORMAL LOW (ref 36.0–46.0)
MCV: 82.8 fL (ref 78.0–100.0)
RBC: 4.01 MIL/uL (ref 3.87–5.11)
WBC: 15.8 10*3/uL — ABNORMAL HIGH (ref 4.0–10.5)

## 2012-09-06 LAB — BASIC METABOLIC PANEL
CO2: 32 mEq/L (ref 19–32)
Chloride: 97 mEq/L (ref 96–112)
GFR calc Af Amer: >90 mL/min (ref >90)
Potassium: 3.8 mEq/L (ref 3.5–5.1)
Sodium: 136 mEq/L (ref 135–145)

## 2012-09-06 MED ORDER — VANCOMYCIN HCL 10 G IV SOLR
2000.0000 mg | Freq: Once | INTRAVENOUS | Status: AC
  Administered 2012-09-06: 2000 mg via INTRAVENOUS
  Filled 2012-09-06: qty 2000

## 2012-09-06 MED ORDER — WHITE PETROLATUM GEL
Status: AC
  Filled 2012-09-06: qty 5

## 2012-09-06 MED ORDER — ACETAMINOPHEN 325 MG PO TABS
650.0000 mg | ORAL_TABLET | Freq: Four times a day (QID) | ORAL | Status: DC | PRN
  Administered 2012-09-06 – 2012-09-07 (×2): 650 mg via ORAL
  Filled 2012-09-06: qty 2
  Filled 2012-09-06: qty 1
  Filled 2012-09-06: qty 2

## 2012-09-06 MED ORDER — VANCOMYCIN HCL IN DEXTROSE 1-5 GM/200ML-% IV SOLN
1000.0000 mg | Freq: Three times a day (TID) | INTRAVENOUS | Status: DC
  Administered 2012-09-06 – 2012-09-08 (×6): 1000 mg via INTRAVENOUS
  Filled 2012-09-06 (×8): qty 200

## 2012-09-06 MED ORDER — SODIUM CHLORIDE 0.9 % IV SOLN
500.0000 mg | Freq: Four times a day (QID) | INTRAVENOUS | Status: DC
  Administered 2012-09-06 – 2012-09-08 (×9): 500 mg via INTRAVENOUS
  Filled 2012-09-06 (×14): qty 500

## 2012-09-06 NOTE — Progress Notes (Signed)
Subjective: The patient reports feeling achy secondary to her recent fall prior to admission. She has been receiving when necessary morphine for pain; however, she reports that the medication wears off quickly and the pain returns. She does not feel any stronger than when she was admitted, although she desperately wants to return home to her previous living situation.  Objective: Current vital signs: BP 131/77  Pulse 107  Temp(Src) 101.4 F (38.6 C) (Axillary)  Resp 20  Ht 5\' 5"  (1.651 m)  Wt 94.3 kg (207 lb 14.3 oz)  BMI 34.6 kg/m2  SpO2 98%  Neurologic Exam: Alert and in no acute distress. The patient appears to be less confused today and is oriented to x3 with minimal cueing. Pupils were equal and reacted normally to light. Extraocular movements were full and conjugate with minimal horizontal nystagmus on right and left lateral gaze. Visual fields were intact and normal. Face was symmetrical. Speech was mildly dysarthric. Strength of upper extremities was normal proximally and distally. Strength of lower extremities was severely impaired with only residual minimal dorsiflexion and plantarflexion of the feet.  Lab Results: Results for orders placed during the hospital encounter of 09/04/12 (from the past 48 hour(s))  TROPONIN I     Status: None   Collection Time    09/04/12  5:15 PM      Result Value Range   Troponin I <0.30  <0.30 ng/mL   Comment:            Due to the release kinetics of cTnI,     a negative result within the first hours     of the onset of symptoms does not rule out     myocardial infarction with certainty.     If myocardial infarction is still suspected,     repeat the test at appropriate intervals.  CBC     Status: Abnormal   Collection Time    09/04/12  5:16 PM      Result Value Range   WBC 9.1  4.0 - 10.5 K/uL   RBC 4.80  3.87 - 5.11 MIL/uL   Hemoglobin 14.1  12.0 - 15.0 g/dL   HCT 57.8  46.9 - 62.9 %   MCV 82.3  78.0 - 100.0 fL   MCH 29.4   26.0 - 34.0 pg   MCHC 35.7  30.0 - 36.0 g/dL   RDW 52.8 (*) 41.3 - 24.4 %   Platelets 228  150 - 400 K/uL  CREATININE, SERUM     Status: None   Collection Time    09/04/12  5:16 PM      Result Value Range   Creatinine, Ser 0.79  0.50 - 1.10 mg/dL   GFR calc non Af Amer >90  >90 mL/min   GFR calc Af Amer >90  >90 mL/min   Comment:            The eGFR has been calculated     using the CKD EPI equation.     This calculation has not been     validated in all clinical     situations.     eGFR's persistently     <90 mL/min signify     possible Chronic Kidney Disease.  TROPONIN I     Status: None   Collection Time    09/04/12  9:49 PM      Result Value Range   Troponin I <0.30  <0.30 ng/mL   Comment:  Due to the release kinetics of cTnI,     a negative result within the first hours     of the onset of symptoms does not rule out     myocardial infarction with certainty.     If myocardial infarction is still suspected,     repeat the test at appropriate intervals.  TROPONIN I     Status: None   Collection Time    09/05/12  7:00 AM      Result Value Range   Troponin I <0.30  <0.30 ng/mL   Comment:            Due to the release kinetics of cTnI,     a negative result within the first hours     of the onset of symptoms does not rule out     myocardial infarction with certainty.     If myocardial infarction is still suspected,     repeat the test at appropriate intervals.  COMPREHENSIVE METABOLIC PANEL     Status: Abnormal   Collection Time    09/05/12  7:00 AM      Result Value Range   Sodium 143  135 - 145 mEq/L   Potassium 3.3 (*) 3.5 - 5.1 mEq/L   Chloride 102  96 - 112 mEq/L   CO2 30  19 - 32 mEq/L   Glucose, Bld 151 (*) 70 - 99 mg/dL   BUN 11  6 - 23 mg/dL   Creatinine, Ser 0.45  0.50 - 1.10 mg/dL   Calcium 9.1  8.4 - 40.9 mg/dL   Total Protein 6.4  6.0 - 8.3 g/dL   Albumin 2.8 (*) 3.5 - 5.2 g/dL   AST 82 (*) 0 - 37 U/L   ALT 81 (*) 0 - 35 U/L   Alkaline  Phosphatase 95  39 - 117 U/L   Total Bilirubin 0.8  0.3 - 1.2 mg/dL   GFR calc non Af Amer >90  >90 mL/min   GFR calc Af Amer >90  >90 mL/min   Comment:            The eGFR has been calculated     using the CKD EPI equation.     This calculation has not been     validated in all clinical     situations.     eGFR's persistently     <90 mL/min signify     possible Chronic Kidney Disease.  CBC     Status: Abnormal   Collection Time    09/05/12  7:00 AM      Result Value Range   WBC 13.6 (*) 4.0 - 10.5 K/uL   RBC 4.53  3.87 - 5.11 MIL/uL   Hemoglobin 13.3  12.0 - 15.0 g/dL   HCT 81.1  91.4 - 78.2 %   MCV 82.1  78.0 - 100.0 fL   MCH 29.4  26.0 - 34.0 pg   MCHC 35.8  30.0 - 36.0 g/dL   RDW 95.6 (*) 21.3 - 08.6 %   Platelets 214  150 - 400 K/uL  URINE CULTURE     Status: None   Collection Time    09/05/12 12:35 PM      Result Value Range   Specimen Description URINE, CATHETERIZED     Special Requests NONE     Culture  Setup Time 09/05/2012 13:14     Colony Count NO GROWTH     Culture NO GROWTH     Report Status  09/06/2012 FINAL    BASIC METABOLIC PANEL     Status: Abnormal   Collection Time    09/06/12  5:30 AM      Result Value Range   Sodium 136  135 - 145 mEq/L   Comment: DELTA CHECK NOTED   Potassium 3.8  3.5 - 5.1 mEq/L   Chloride 97  96 - 112 mEq/L   CO2 32  19 - 32 mEq/L   Glucose, Bld 123 (*) 70 - 99 mg/dL   BUN 12  6 - 23 mg/dL   Creatinine, Ser 4.09  0.50 - 1.10 mg/dL   Calcium 8.9  8.4 - 81.1 mg/dL   GFR calc non Af Amer 85 (*) >90 mL/min   GFR calc Af Amer >90  >90 mL/min   Comment:            The eGFR has been calculated     using the CKD EPI equation.     This calculation has not been     validated in all clinical     situations.     eGFR's persistently     <90 mL/min signify     possible Chronic Kidney Disease.  CBC     Status: Abnormal   Collection Time    09/06/12  5:30 AM      Result Value Range   WBC 15.8 (*) 4.0 - 10.5 K/uL   RBC 4.01   3.87 - 5.11 MIL/uL   Hemoglobin 11.7 (*) 12.0 - 15.0 g/dL   HCT 91.4 (*) 78.2 - 95.6 %   MCV 82.8  78.0 - 100.0 fL   MCH 29.2  26.0 - 34.0 pg   MCHC 35.2  30.0 - 36.0 g/dL   RDW 21.3 (*) 08.6 - 57.8 %   Platelets 231  150 - 400 K/uL  URINALYSIS, ROUTINE W REFLEX MICROSCOPIC     Status: Abnormal   Collection Time    09/06/12 12:25 PM      Result Value Range   Color, Urine YELLOW  YELLOW   APPearance CLEAR  CLEAR   Specific Gravity, Urine 1.010  1.005 - 1.030   pH 7.5  5.0 - 8.0   Glucose, UA NEGATIVE  NEGATIVE mg/dL   Hgb urine dipstick SMALL (*) NEGATIVE   Bilirubin Urine NEGATIVE  NEGATIVE   Ketones, ur NEGATIVE  NEGATIVE mg/dL   Protein, ur NEGATIVE  NEGATIVE mg/dL   Urobilinogen, UA 1.0  0.0 - 1.0 mg/dL   Nitrite NEGATIVE  NEGATIVE   Leukocytes, UA SMALL (*) NEGATIVE  URINE MICROSCOPIC-ADD ON     Status: Abnormal   Collection Time    09/06/12 12:25 PM      Result Value Range   Squamous Epithelial / LPF FEW (*) RARE   WBC, UA 7-10  <3 WBC/hpf   RBC / HPF 0-2  <3 RBC/hpf   Bacteria, UA RARE  RARE    Studies/Results: Mr Lodema Pilot Contrast  09/05/2012 .  MRI HEAD WITHOUT AND WITH CONTRAST   IMPRESSION: Diffuse patchy white matter type changes most notable in the periventricular region although also seen in the subcortical region.  The number and size of white matter lesions have progressed since the prior examination indicating  progressive multiple sclerosis.  Areas which appear largest include the right parietal lobe and left mid frontal lobe periventricular area. These regions do not have significant associated mass effect as may be seen with PML (as may be seen as a complication of MS  treatment). Recommend confirming stability with MR monitoring.  Involvement of the right motor strip by MS plaques once again noted causing T2 shine through on the diffusion sequence.  No MRl findings of acute demyelination.  No acute infarct.  Right pituitary 1.2 x 1.1 x 1.2 cm mass. With the  aid in retrospection, this was barely visualized on the prior examination when this measured 1.2 x 0.7 x 1.1 cm.  This may represent a pituitary adenoma.  No intracranial hemorrhage.  Posterior left parafalcine and 4.2 mm focal calcification versus tiny meningioma without associated mass effect without change.  Global atrophy without hydrocephalus.    Dg Chest Port 1 View 09/06/12  IMPRESSION:  1.  Bibasilar subsegmental atelectasis. 2.  Cardiomegaly.      Medications:  I have reviewed the patient's current medications. Scheduled: . amantadine  100 mg Oral BID  . carvedilol  6.25 mg Oral BID WC  . darifenacin  7.5 mg Oral Daily  . digoxin  0.25 mg Oral Daily  . furosemide  40 mg Oral BID  . gabapentin  600 mg Oral TID  . heparin  5,000 Units Subcutaneous Q8H  . imipenem-cilastatin  500 mg Intravenous Q6H  . imipramine  100 mg Oral QHS  . spironolactone  12.5 mg Oral q morning - 10a  . temazepam  15 mg Oral QHS  . Travoprost (BAK Free)  1 drop Both Eyes QHS  . vancomycin  1,000 mg Intravenous Q8H   ZOX:[id number], morphine injection  Assessment/Plan: Probable MS exacerbation as etiology for patient's presentation with marked deterioration of lower extremity function and strength, with prolonged immobility and subsequent rhabdomyolysis. She appears to have significant cognitive deficits as well. Patient reportedly just completed a course of steroid treatment which was of no benefit clinically. It is also doubtful that a repeat course of steroids would be of benefit.  It's clear that this point that she cannot live alone and perform activities of daily living and will need placement in a long-term care facility. Recommend continued physical and occupational therapy intervention as well and  speech therapy evaluation of speech and swallowing. The physical, occupational, and speech therapists have all recommended skilled nursing facility placement. Her cognitive status appears improved  today.  We will continue to follow this patient with you.  Delton See PA-C [facility] Pager [phone]  I personally participate in this patient's care including formulating the above assessment and management recommendations.  Venetia Maxon M.D. [facility] [phone]  09/06/2012, 4:04 PM

## 2012-09-06 NOTE — Progress Notes (Signed)
Pt's temp=100.8;DR.OTI made aware & ordered blood culture & port.cxr.

## 2012-09-06 NOTE — Progress Notes (Signed)
ANTIBIOTIC CONSULT NOTE - INITIAL  Pharmacy Consult for vancomycin, Primaxin Indication: Empiric  Allergies  Allergen Reactions  . Sulfonamide Derivatives Shortness Of Breath  . Penicillins Hives and Itching    Patient Measurements: Height: 5\' 5"  (165.1 cm) Weight: 207 lb 14.3 oz (94.3 kg) IBW/kg (Calculated) : 57  Vital Signs: Temp: 101 F (38.3 C) (03/08 0500) Temp src: Oral (03/08 0500) BP: 111/74 mmHg (03/08 0500) Pulse Rate: 121 (03/08 0500) Intake/Output from previous day: 03/07 0701 - 03/08 0700 In: 1200 [P.O.:1200] Out: 1600 [Urine:1600] Intake/Output from this shift: Total I/O In: 480 [P.O.:480] Out: 1100 [Urine:1100]  Labs:  Recent Labs  09/04/12 1217 09/04/12 1716 09/05/12 0700  WBC 10.6* 9.1 13.6*  HGB 13.6 14.1 13.3  PLT 241 228 214  CREATININE 0.81 0.79 0.67   Estimated Creatinine Clearance: 96.6 ml/min (by C-G formula based on Cr of 0.67). No results found for this basename: VANCOTROUGH, VANCOPEAK, VANCORANDOM, GENTTROUGH, GENTPEAK, GENTRANDOM, TOBRATROUGH, TOBRAPEAK, TOBRARND, AMIKACINPEAK, AMIKACINTROU, AMIKACIN,  in the last 72 hours   Microbiology: No results found for this or any previous visit (from the past 720 hour(s)).  Medical History: Past Medical History  Diagnosis Date  . MS (multiple sclerosis)     a. Dx'd late 20's. b. Tx with Novantrone, Tysabri, Copaxone previously.  Marland Kitchen Hypertension   . Depression   . Glaucoma(365)   . Cardiomyopathy     a.  Echo 04/29/12: Mild LVH, EF 20-25%, mild AI, moderate MR, moderate LAE, mild RAE, mild RVE, moderate TR, PASP 51, small pericardial effusion;   b. probably non-ischemic given multiple chemo-Tx agents used for MS and global LV dysfn on echo  . Chronic systolic heart failure   . CHF (congestive heart failure)   . Shortness of breath   . Neuromuscular disorder     MS    Medications:  Scheduled:  . amantadine  100 mg Oral BID  . carvedilol  6.25 mg Oral BID WC  . darifenacin  7.5 mg  Oral Daily  . digoxin  0.25 mg Oral Daily  . furosemide  40 mg Oral BID  . gabapentin  600 mg Oral TID  . heparin  5,000 Units Subcutaneous Q8H  . imipramine  100 mg Oral QHS  . [COMPLETED] potassium chloride  40 mEq Oral Q6H  . spironolactone  12.5 mg Oral q morning - 10a  . temazepam  15 mg Oral QHS  . Travoprost (BAK Free)  1 drop Both Eyes QHS  . [DISCONTINUED] zolpidem  5 mg Oral QHS   Assessment: 52 yo female with h/o MS admitted after fall. Patient has been febrile overnight. Pharmacy to manage vancomycin and Primaxin for empiric therapy.   Goal of Therapy:  Vancomycin trough 10 - 20 mcg/mL  Plan:  1. Vancomycin 2gm IV x 1, then 1gm IV Q8H.  2. Primaxin 500mg  IV Q6H.   Emeline Gins 09/06/2012,5:25 AM

## 2012-09-06 NOTE — Progress Notes (Signed)
[facility] PROGRESS NOTE  Shannon Garrett YQM:[id number] DOB: 1961-06-01 DOA: 09/04/2012 PCP: Jeri Cos, MD  HPI/Subjective: More awake and alert today, wants to go home not to SNF   Assessment/Plan:  Fall -Unclear etiology, patient is very poor historian. -Was recently started on digoxin, she is on Lasix and Aldactone as well as Coreg.  -This is might be orthostatic hypotension secondary to the CHF medications. -She is on telemetry, her ejection fraction is less than 30%, cannot rule out tachyarrhythmias.  Fever -Of unclear etiology, patient has clear urine, chest x-ray is okay. Blood cultures obtained. -Started empirically on vancomycin and Primaxin. Follow blood cultures and fever curve.  Multiple sclerosis -Patient has progressive relapsing MS, she has marked decline in her functional status over the past year. -PT/OT evaluated the patient and recommended SNF. -Patient is on Symmetrel 100 mg BID. We will continue urology is evaluating the patient. -Patient had an MRI showed extensive white patches consistent with MS, no acute strokes  Chronic CHF/nonischemic cardiomyopathy -Seems to have severe combined chronic CHF with LVEF of 20-25%, she does not have AICD. -She is on Lasix, Aldactone, Coreg and lisinopril. Digoxin added recently -Patient appears to be euvolemic, no dyspnea orthopnea. -Lasix was held yesterday, she was given IV fluids because of obvious dehydration, stop IV fluids and restart diuretics.  Functional status -As mentioned above evaluated by PT/OT recommended SNF placement. -Also seen by SLP and recommended dysphagia 3 with the liquids. -Cognitive function improved since yesterday, wonder if that was secondary to the steroids she was recently got.  Code Status: Full code Family Communication: Discussed with the family, can access family Disposition Plan: Inpatient, likely to be discharged to rehabilitation  facility   Consultants:  Neurology  Procedures:  None  Antibiotics:  None   Objective: Filed Vitals:   09/05/12 1429 09/05/12 2154 09/05/12 2346 09/06/12 0500  BP: 137/74 151/76  111/74  Pulse: 102 94  121  Temp: 99.2 F (37.3 C) 100.7 F (38.2 C) 100.8 F (38.2 C) 101 F (38.3 C)  TempSrc: Axillary Axillary Oral Oral  Resp: 18 20  18   Height:      Weight:    94.3 kg (207 lb 14.3 oz)  SpO2: 97% 98%  98%    Intake/Output Summary (Last 24 hours) at 09/06/12 1025 Last data filed at 09/06/12 0646  Gross per 24 hour  Intake   1410 ml  Output   1350 ml  Net     60 ml   Filed Weights   09/04/12 1600 09/05/12 0436 09/06/12 0500  Weight: 91.4 kg (201 lb 8 oz) 89.9 kg (198 lb 3.1 oz) 94.3 kg (207 lb 14.3 oz)    Exam:  General: Alert and awake, oriented x3, not in any acute distress. HEENT: anicteric sclera, pupils reactive to light and accommodation, EOMI CVS: S1-S2 clear, no murmur rubs or gallops Chest: clear to auscultation bilaterally, no wheezing, rales or rhonchi Abdomen: soft nontender, nondistended, normal bowel sounds, no organomegaly Extremities: no cyanosis, clubbing or edema noted bilaterally Neuro: Cranial nerves II-XII intact, no focal neurological deficits  Data Reviewed: Basic Metabolic Panel:  Recent Labs Lab 09/04/12 1217 09/04/12 1716 09/05/12 0700 09/06/12 0530  NA 144  --  143 136  K 3.3*  --  3.3* 3.8  CL 98  --  102 97  CO2 36*  --  30 32  GLUCOSE 122*  --  151* 123*  BUN 13  --  11 12  CREATININE 0.81 0.79 0.67 0.80  CALCIUM 9.7  --  9.1 8.9   Liver Function Tests:  Recent Labs Lab 09/04/12 1217 09/05/12 0700  AST 53* 82*  ALT 100* 81*  ALKPHOS 111 95  BILITOT 0.4 0.8  PROT 7.7 6.4  ALBUMIN 3.8 2.8*   No results found for this basename: LIPASE, AMYLASE,  in the last 168 hours No results found for this basename: AMMONIA,  in the last 168 hours CBC:  Recent Labs Lab 09/04/12 1217 09/04/12 1716 09/05/12 0700  09/06/12 0530  WBC 10.6* 9.1 13.6* 15.8*  NEUTROABS 9.2*  --   --   --   HGB 13.6 14.1 13.3 11.7*  HCT 37.3 39.5 37.2 33.2*  MCV 80.2 82.3 82.1 82.8  PLT 241 228 214 231   Cardiac Enzymes:  Recent Labs Lab 09/04/12 1217 09/04/12 1715 09/04/12 2149 09/05/12 0700  CKTOTAL 2396*  --   --   --   TROPONINI  --  <0.30 <0.30 <0.30   BNP (last 3 results)  Recent Labs  04/28/12 1658 04/30/12 0455  PROBNP 16405.0* 10618.0*   CBG: No results found for this basename: GLUCAP,  in the last 168 hours  No results found for this or any previous visit (from the past 240 hour(s)).   Studies: Dg Chest 2 View  09/04/2012  *RADIOLOGY REPORT*  Clinical Data: Weakness.  History of fall.  CHEST - 2 VIEW  Comparison: Chest x-ray 06/06/2012.  Findings: Lung volumes are low.  No consolidative airspace disease. No pleural effusions.  No suspicious appearing pulmonary nodules or masses are identified.  No pneumothorax.  Pulmonary vasculature is within normal limits.  Heart size is mildly enlarged (unchanged). The patient is rotated to the left on today's exam, resulting in distortion of the mediastinal contours and reduced diagnostic sensitivity and specificity for mediastinal pathology.  IMPRESSION: 1.  Low lung volumes without radiographic evidence of acute cardiopulmonary disease. 2.  Mild cardiomegaly.   Original Report Authenticated By: Trudie Reed, M.D.    Mr Laqueta Jean Wo Contrast  09/05/2012  *RADIOLOGY REPORT*  Clinical Data: History multiple sclerosis, cardiomyopathy and hypertension. Became weak and fell. Down possibly for 10 hours.  MRI HEAD WITHOUT AND WITH CONTRAST  Technique:  Multiplanar, multiecho pulse sequences of the brain and surrounding structures were obtained according to standard protocol without and with intravenous contrast  Contrast: 20mL MULTIHANCE GADOBENATE DIMEGLUMINE 529 MG/ML IV SOLN  Comparison: 06/01/2006.  Findings: Diffuse patchy white matter type changes most notable in  the periventricular region although also seen in the subcortical region.  The number and size of white matter lesions have progressed since the prior examination indicating  progressive multiple sclerosis.  Areas which appear largest include the right parietal lobe and left mid frontal lobe periventricular area. These regions do not have significant associated mass effect as may be seen with PML (as may be seen as a complication of MS treatment). Recommend confirming stability with MR monitoring.  Involvement of the right motor strip by MS plaques once again noted causing T2 shine through on the diffusion sequence.  None of these MS plaques demonstrate restricted motion or enhancement as may be seen with acute areas of demyelination.  No acute infarct.  Right pituitary 1.2 x 1.1 x 1.2 cm mass. With the aid in retrospection, this was barely visualized on the prior examination when this measured 1.2 x 0.7 x 1.1 cm. Minimal suprasellar extension and extension towards the right cavernous sinus.  No compression of the undersurface of the right optic  nerves/optic chiasm. Infundibulum is deviated to the left.  This may represent a pituitary adenoma.  Mild exophthalmos otherwise orbital wall structures unremarkable.  No intracranial hemorrhage.  Posterior left parafalcine and 4.2 mm focal calcification versus tiny meningioma without associated mass effect.  Findings without change.  Global atrophy without hydrocephalus.  Minimal paranasal sinus mucosal thickening with polypoid opacification inferior aspect right maxillary sinus.  Major intracranial vascular structures are patent.  IMPRESSION: Diffuse patchy white matter type changes most notable in the periventricular region although also seen in the subcortical region.  The number and size of white matter lesions have progressed since the prior examination indicating  progressive multiple sclerosis.  Areas which appear largest include the right parietal lobe and left mid  frontal lobe periventricular area. These regions do not have significant associated mass effect as may be seen with PML (as may be seen as a complication of MS treatment). Recommend confirming stability with MR monitoring.  Involvement of the right motor strip by MS plaques once again noted causing T2 shine through on the diffusion sequence.  No MRl findings of acute demyelination.  No acute infarct.  Right pituitary 1.2 x 1.1 x 1.2 cm mass. With the aid in retrospection, this was barely visualized on the prior examination when this measured 1.2 x 0.7 x 1.1 cm.  This may represent a pituitary adenoma.  No intracranial hemorrhage.  Posterior left parafalcine and 4.2 mm focal calcification versus tiny meningioma without associated mass effect without change.  Global atrophy without hydrocephalus.   Original Report Authenticated By: Lacy Duverney, M.D.    Dg Chest Port 1 View  09/06/2012  *RADIOLOGY REPORT*  Clinical Data: Fever.  PORTABLE CHEST - 1 VIEW  Comparison: PA and lateral chest 04/28/2012 and 09/04/2012.  CT chest 04/29/2012.  Findings: The patient is rotated on the study.  There is cardiomegaly.  Linear subsegmental atelectasis is seen in the lung bases.  Lungs otherwise clear.  No pneumothorax or pleural effusion.  IMPRESSION:  1.  Bibasilar subsegmental atelectasis. 2.  Cardiomegaly.   Original Report Authenticated By: Holley Dexter, M.D.    Ct Maxillofacial Wo Cm  09/04/2012  *RADIOLOGY REPORT*  Clinical Data: History of trauma from a fall with injury to the left eye.  CT MAXILLOFACIAL WITHOUT CONTRAST  Technique:  Multidetector CT imaging of the maxillofacial structures was performed. Multiplanar CT image reconstructions were also generated.  Comparison: Head CT 06/06/2012.  Findings: No acute displaced facial bone fractures are identified. Specifically, the left orbit is intact.  The left globe is intact and retrobulbar soft tissues are within normal limits.  A small amount of soft tissue swelling  superficial to the left orbit is compatible with a contusion.  The mandible is intact, and the mandibular condyles are properly located.  Poor dentition with multiple missing teeth incidentally noted.  Small mucosal retention cyst or polyp in the inferior aspect of the right maxillary sinus is incidentally noted. Visualized intracranial contents are remarkable for atrophy and what appear to be chronic ischemic changes in the cerebral white matter (similar to prior).  IMPRESSION: 1.  Soft tissue contusion around the left orbit without evidence of acute displaced facial bone fracture or other signs of significant acute trauma.   Original Report Authenticated By: Trudie Reed, M.D.     Scheduled Meds: . amantadine  100 mg Oral BID  . carvedilol  6.25 mg Oral BID WC  . darifenacin  7.5 mg Oral Daily  . digoxin  0.25 mg Oral Daily  .  furosemide  40 mg Oral BID  . gabapentin  600 mg Oral TID  . heparin  5,000 Units Subcutaneous Q8H  . imipenem-cilastatin  500 mg Intravenous Q6H  . imipramine  100 mg Oral QHS  . spironolactone  12.5 mg Oral q morning - 10a  . temazepam  15 mg Oral QHS  . Travoprost (BAK Free)  1 drop Both Eyes QHS  . vancomycin  2,000 mg Intravenous Once  . vancomycin  1,000 mg Intravenous Q8H   Continuous Infusions:    Active Problems:   ANEMIA, CHRONIC   MULTIPLE SCLEROSIS, PROGRESSIVE/RELAPSING   HYPERTENSION, BENIGN ESSENTIAL   Other primary cardiomyopathies   Chronic systolic heart failure   Fall   EKG abnormalities   Severe malnutrition   Paraplegia    Time spent: 35 minutes    [facility]  [facility] Pager [phone]. If 7PM-7AM, please contact night-coverage at [email or web address], password [facility] 09/06/2012, 10:25 AM  LOS: 2 days

## 2012-09-07 DIAGNOSIS — I10 Essential (primary) hypertension: Secondary | ICD-10-CM

## 2012-09-07 LAB — BASIC METABOLIC PANEL
BUN: 11 mg/dL (ref 6–23)
Chloride: 98 mEq/L (ref 96–112)
Creatinine, Ser: 0.79 mg/dL (ref 0.50–1.10)
GFR calc Af Amer: >90 mL/min (ref >90)
GFR calc non Af Amer: >90 mL/min (ref >90)

## 2012-09-07 LAB — CBC
MCHC: 36.3 g/dL — ABNORMAL HIGH (ref 30.0–36.0)
Platelets: 207 10*3/uL (ref 150–400)
RDW: 15.6 % — ABNORMAL HIGH (ref 11.5–15.5)
WBC: 14.3 10*3/uL — ABNORMAL HIGH (ref 4.0–10.5)

## 2012-09-07 MED ORDER — SENNOSIDES-DOCUSATE SODIUM 8.6-50 MG PO TABS: 1.0000 | ORAL_TABLET | Freq: Every evening | ORAL | Status: DC | PRN

## 2012-09-07 MED ORDER — ACETAMINOPHEN 325 MG PO TABS
650.0000 mg | ORAL_TABLET | Freq: Four times a day (QID) | ORAL | Status: DC | PRN
  Administered 2012-09-07: 650 mg via ORAL

## 2012-09-07 MED ORDER — SPIRONOLACTONE 25 MG PO TABS
25.0000 mg | ORAL_TABLET | Freq: Every morning | ORAL | Status: DC
  Administered 2012-09-08: 25 mg via ORAL
  Filled 2012-09-07: qty 1

## 2012-09-07 NOTE — Clinical Social Work Psychosocial (Signed)
Clinical Social Work Department BRIEF PSYCHOSOCIAL ASSESSMENT 09/07/2012  Patient:  Shannon Garrett, Shannon Garrett     Account Number:  [id number]     Admit date:  09/04/2012  Clinical Social Worker:  Oswaldo Done  Date/Time:  09/07/2012 04:29 PM  Referred by:  CSW  Date Referred:  09/05/2012 Referred for  SNF Placement   Other Referral:   Interview type:  Patient Other interview type:    PSYCHOSOCIAL DATA Living Status:  ALONE Admitted from facility:   Level of care:   Primary support name:  Gwenith Spitz Primary support relationship to patient:  FAMILY Degree of support available:    CURRENT CONCERNS Current Concerns  Post-Acute Placement   Other Concerns:    SOCIAL WORK ASSESSMENT / PLAN CSW met patient at bedside. Patient lives at home alone. Has [facility] come in for physical therapy and aide through [facility]. Provided edcuation to patient regarding SNF services, patient better understood purpose and reason for placement and is open to a [facility] bed search, with goal to return home after completing therapy.   Assessment/plan status:  Information/Referral to [facility] Other assessment/ plan:   Information/referral to community resources:   SNF List    PATIENT'S/FAMILY'S RESPONSE TO PLAN OF CARE: Patient thanked CSW for SNF list and for facilitating SNF bed search. Weekday CSW to follow up.        Ricke Hey, [place] [phone] (weekend)

## 2012-09-07 NOTE — Progress Notes (Signed)
[facility] PROGRESS NOTE  Shannon Garrett ZDG:[id number] DOB: 1961/04/19 DOA: 09/04/2012 PCP: Jeri Cos, MD  HPI/Subjective: Continued to have fever all day yesterday, she is on meropenem and   Assessment/Plan:  Fall -Unclear etiology, patient is very poor historian. -Was recently started on digoxin, she is on Lasix and Aldactone as well as Coreg.  -This is might be orthostatic hypotension secondary to the CHF medications. -She is on telemetry, her ejection fraction is less than 30%, cannot rule out tachyarrhythmias.  Fever -Of unclear etiology, patient has clear urine, chest x-ray is okay. Blood cultures obtained. -She has leukocytosis, improved slightly from 15.8 to 14.3 after initiation of antibiotics. -Started empirically on vancomycin and Primaxin. Follow blood cultures and fever curve.  Multiple sclerosis -Patient has progressive relapsing MS, she has marked decline in her functional status over the past year. -PT/OT evaluated the patient and recommended SNF. -Patient is on Symmetrel 100 mg BID. We will continue urology is evaluating the patient. -Patient had an MRI showed extensive white patches consistent with MS, no acute strokes  Chronic CHF/nonischemic cardiomyopathy -Seems to have severe combined chronic CHF with LVEF of 20-25%, she does not have AICD. -She is on Lasix, Aldactone, Coreg and lisinopril. Digoxin added recently -Patient appears to be euvolemic, no dyspnea orthopnea. -Lasix was held yesterday, she was given IV fluids because of obvious dehydration, stop IV fluids and restart diuretics.  Functional status -As mentioned above evaluated by PT/OT recommended SNF placement. -Also seen by SLP and recommended dysphagia 3 with the liquids. -Cognitive function improved since yesterday, wonder if that was secondary to the steroids she was recently got.  Code Status: Full code Family Communication: Discussed with the family, can access  family Disposition Plan: Inpatient, PT recommended SNF, patient wants to go home.   Consultants:  Neurology  Procedures:  None  Antibiotics:  None   Objective: Filed Vitals:   09/06/12 1611 09/06/12 2119 09/06/12 2244 09/07/12 0525  BP: 112/69 112/64  105/65  Pulse: 98 103  105  Temp: 100.4 F (38 C) 101.3 F (38.5 C) 99.4 F (37.4 C) 97.4 F (36.3 C)  TempSrc: Oral Oral Oral Axillary  Resp:  18  20  Height:      Weight:    97.8 kg (215 lb 9.8 oz)  SpO2:  96%  96%    Intake/Output Summary (Last 24 hours) at 09/07/12 1050 Last data filed at 09/07/12 1039  Gross per 24 hour  Intake    480 ml  Output   2250 ml  Net  -1770 ml   Filed Weights   09/05/12 0436 09/06/12 0500 09/07/12 0525  Weight: 89.9 kg (198 lb 3.1 oz) 94.3 kg (207 lb 14.3 oz) 97.8 kg (215 lb 9.8 oz)    Exam:  General: Alert and awake, oriented x3, not in any acute distress. HEENT: anicteric sclera, pupils reactive to light and accommodation, EOMI CVS: S1-S2 clear, no murmur rubs or gallops Chest: clear to auscultation bilaterally, no wheezing, rales or rhonchi Abdomen: soft nontender, nondistended, normal bowel sounds, no organomegaly Extremities: no cyanosis, clubbing or edema noted bilaterally Neuro: Cranial nerves II-XII intact, no focal neurological deficits  Data Reviewed: Basic Metabolic Panel:  Recent Labs Lab 09/04/12 1217 09/04/12 1716 09/05/12 0700 09/06/12 0530 09/07/12 0530  NA 144  --  143 136 135  K 3.3*  --  3.3* 3.8 3.5  CL 98  --  102 97 98  CO2 36*  --  30 32 30  GLUCOSE 122*  --  151* 123* 105*  BUN 13  --  11 12 11   CREATININE 0.81 0.79 0.67 0.80 0.79  CALCIUM 9.7  --  9.1 8.9 8.5   Liver Function Tests:  Recent Labs Lab 09/04/12 1217 09/05/12 0700  AST 53* 82*  ALT 100* 81*  ALKPHOS 111 95  BILITOT 0.4 0.8  PROT 7.7 6.4  ALBUMIN 3.8 2.8*   No results found for this basename: LIPASE, AMYLASE,  in the last 168 hours No results found for this  basename: AMMONIA,  in the last 168 hours CBC:  Recent Labs Lab 09/04/12 1217 09/04/12 1716 09/05/12 0700 09/06/12 0530 09/07/12 0530  WBC 10.6* 9.1 13.6* 15.8* 14.3*  NEUTROABS 9.2*  --   --   --   --   HGB 13.6 14.1 13.3 11.7* 9.7*  HCT 37.3 39.5 37.2 33.2* 26.7*  MCV 80.2 82.3 82.1 82.8 80.4  PLT 241 228 214 231 207   Cardiac Enzymes:  Recent Labs Lab 09/04/12 1217 09/04/12 1715 09/04/12 2149 09/05/12 0700  CKTOTAL 2396*  --   --   --   TROPONINI  --  <0.30 <0.30 <0.30   BNP (last 3 results)  Recent Labs  04/28/12 1658 04/30/12 0455  PROBNP 16405.0* 10618.0*   CBG: No results found for this basename: GLUCAP,  in the last 168 hours  Recent Results (from the past 240 hour(s))  URINE CULTURE     Status: None   Collection Time    09/05/12 12:35 PM      Result Value Range Status   Specimen Description URINE, CATHETERIZED   Final   Special Requests NONE   Final   Culture  Setup Time 09/05/2012 13:14   Final   Colony Count NO GROWTH   Final   Culture NO GROWTH   Final   Report Status 09/06/2012 FINAL   Final     Studies: Dg Chest Port 1 View  09/06/2012  *RADIOLOGY REPORT*  Clinical Data: Fever.  PORTABLE CHEST - 1 VIEW  Comparison: PA and lateral chest 04/28/2012 and 09/04/2012.  CT chest 04/29/2012.  Findings: The patient is rotated on the study.  There is cardiomegaly.  Linear subsegmental atelectasis is seen in the lung bases.  Lungs otherwise clear.  No pneumothorax or pleural effusion.  IMPRESSION:  1.  Bibasilar subsegmental atelectasis. 2.  Cardiomegaly.   Original Report Authenticated By: Holley Dexter, M.D.     Scheduled Meds: . amantadine  100 mg Oral BID  . carvedilol  6.25 mg Oral BID WC  . darifenacin  7.5 mg Oral Daily  . digoxin  0.25 mg Oral Daily  . furosemide  40 mg Oral BID  . gabapentin  600 mg Oral TID  . heparin  5,000 Units Subcutaneous Q8H  . imipenem-cilastatin  500 mg Intravenous Q6H  . imipramine  100 mg Oral QHS  .  spironolactone  12.5 mg Oral q morning - 10a  . temazepam  15 mg Oral QHS  . Travoprost (BAK Free)  1 drop Both Eyes QHS  . vancomycin  1,000 mg Intravenous Q8H   Continuous Infusions:    Active Problems:   ANEMIA, CHRONIC   MULTIPLE SCLEROSIS, PROGRESSIVE/RELAPSING   HYPERTENSION, BENIGN ESSENTIAL   Other primary cardiomyopathies   Chronic systolic heart failure   Fall   Fever, unspecified   Severe malnutrition   Paraplegia    Time spent: 35 minutes    [facility] A  [facility] Pager [phone]. If 7PM-7AM, please contact night-coverage at [email or web address],  password [email or web address] 09/07/2012, 10:50 AM  LOS: 3 days

## 2012-09-07 NOTE — Clinical Social Work Placement (Signed)
Clinical Social Work Department CLINICAL SOCIAL WORK PLACEMENT NOTE 09/07/2012  Patient:  Shannon Garrett, Shannon Garrett  Account Number:  [id number] Admit date:  09/04/2012  Clinical Social Worker:  Oswaldo Done  Date/time:  09/07/2012 04:40 PM  Clinical Social Work is seeking post-discharge placement for this patient at the following level of care:   SKILLED NURSING   (*CSW will update this form in Epic as items are completed)   09/07/2012  Patient/family provided with [facility] Department of Clinical Social Work's list of facilities offering this level of care within the geographic area requested by the patient (or if unable, by the patient's family).  09/07/2012  Patient/family informed of their freedom to choose among providers that offer the needed level of care, that participate in Medicare, Medicaid or managed care program needed by the patient, have an available bed and are willing to accept the patient.  09/07/2012  Patient/family informed of [facility]' ownership interest in [facility], as well as of the fact that they are under no obligation to receive care at this facility.  PASARR submitted to EDS on  PASARR number received from EDS on   FL2 transmitted to all facilities in geographic area requested by pt/family on  09/07/2012 FL2 transmitted to all facilities within larger geographic area on   Patient informed that his/her managed care company has contracts with or will negotiate with  certain facilities, including the following:     Patient/family informed of bed offers received:   Patient chooses bed at  Physician recommends and patient chooses bed at    Patient to be transferred to  on   Patient to be transferred to facility by   The following physician request were entered in Epic:   Additional Comments:  Ricke Hey, Theresia Majors [phone] (weekend)

## 2012-09-08 LAB — CBC
HCT: 27.9 % — ABNORMAL LOW (ref 36.0–46.0)
Hemoglobin: 10.3 g/dL — ABNORMAL LOW (ref 12.0–15.0)
MCH: 29.8 pg (ref 26.0–34.0)
MCHC: 36.9 g/dL — ABNORMAL HIGH (ref 30.0–36.0)
MCV: 80.6 fL (ref 78.0–100.0)
Platelets: 258 K/uL (ref 150–400)
RBC: 3.46 MIL/uL — ABNORMAL LOW (ref 3.87–5.11)
RDW: 15.2 % (ref 11.5–15.5)
WBC: 14.4 K/uL — ABNORMAL HIGH (ref 4.0–10.5)

## 2012-09-08 LAB — BASIC METABOLIC PANEL WITH GFR
BUN: 11 mg/dL (ref 6–23)
CO2: 30 meq/L (ref 19–32)
Calcium: 8.8 mg/dL (ref 8.4–10.5)
Chloride: 97 meq/L (ref 96–112)
Creatinine, Ser: 0.73 mg/dL (ref 0.50–1.10)
GFR calc Af Amer: >90 mL/min
GFR calc non Af Amer: >90 mL/min
Glucose, Bld: 105 mg/dL — ABNORMAL HIGH (ref 70–99)
Potassium: 3.6 meq/L (ref 3.5–5.1)
Sodium: 136 meq/L (ref 135–145)

## 2012-09-08 MED ORDER — FLEET ENEMA 7-19 GM/118ML RE ENEM
1.0000 | ENEMA | Freq: Once | RECTAL | Status: AC
  Administered 2012-09-08: 1 via RECTAL
  Filled 2012-09-08: qty 1

## 2012-09-08 MED ORDER — FUROSEMIDE 40 MG PO TABS: 40.0000 mg | ORAL_TABLET | Freq: Every day | ORAL | Status: DC

## 2012-09-08 MED ORDER — FUROSEMIDE 40 MG PO TABS
60.0000 mg | ORAL_TABLET | Freq: Two times a day (BID) | ORAL | Status: DC
  Administered 2012-09-08: 60 mg via ORAL
  Filled 2012-09-08 (×2): qty 1

## 2012-09-08 MED ORDER — LEVOFLOXACIN 750 MG PO TABS: 750.0000 mg | ORAL_TABLET | Freq: Every day | ORAL | Status: DC

## 2012-09-08 MED ORDER — POTASSIUM CHLORIDE CRYS ER 20 MEQ PO TBCR
40.0000 meq | EXTENDED_RELEASE_TABLET | Freq: Every day | ORAL | Status: DC
  Administered 2012-09-08: 40 meq via ORAL
  Filled 2012-09-08: qty 2

## 2012-09-08 MED ORDER — SENNOSIDES-DOCUSATE SODIUM 8.6-50 MG PO TABS
2.0000 | ORAL_TABLET | Freq: Once | ORAL | Status: AC
  Administered 2012-09-08: 2 via ORAL
  Filled 2012-09-08 (×2): qty 2

## 2012-09-08 NOTE — Clinical Social Work Note (Signed)
CSW received consult for SNF. Per MD notes and the patient, patient will go home with PT. RNCM is aware of home health needs. CSW signing off, no other psychosocial concerns identified. Please re-consult as needed.  Lia Foyer, LCSWA [facility] Clinical Social Worker Contact #: [phone]

## 2012-09-08 NOTE — Progress Notes (Signed)
1600 Discharge instructions reviewed with patient verbalize and understands. Prescriptions given to patient . Skin with old healed area to left buttock.

## 2012-09-08 NOTE — Progress Notes (Signed)
1740 Patient discharged to home per order via ambulance accompanied by two attendants. Skin with old healed area to buttock.

## 2012-09-08 NOTE — Discharge Summary (Signed)
Physician Discharge Summary  Shannon Garrett ZOX:[id number] DOB: Jun 12, 1961 DOA: 09/04/2012  PCP: Jeri Cos, MD Neurologist:  Dr. Harriette Bouillon, [place], [place]  Admit date: 09/04/2012 Discharge date: 09/08/2012  Time spent:  45 minutes  Recommendations for Outpatient Follow-up:  1. Shannon. Garrett refused skilled nursing facility placement and insisted upon returning to her own home. She will be sent with home health RN, PT, OT, Child psychotherapist. 2. PCP follow up in 1 week:  Check CK, CBC, volume status and CHF status.  Diuretics have been decreased due to possible orthostasis.  Refer to Neurologist for follow up of possible pituitary mass on MRI. 3. Neurology follow up recommended for progressively worsening Shannon and possible right pituitary adenoma.   Discharge Diagnoses:  Principal Problem:   Fever, unspecified Active Problems:   ANEMIA, CHRONIC   MULTIPLE SCLEROSIS, PROGRESSIVE/RELAPSING   HYPERTENSION, BENIGN ESSENTIAL   Other primary cardiomyopathies   Chronic systolic heart failure   Fall   Severe malnutrition   Paraplegia   Discharge Condition: Stable, altered mental status resolved.  Afebrile for over 24 hours.  Diet recommendation: Soft diet due to mild dysphagia  Filed Weights   09/06/12 0500 09/07/12 0525 09/08/12 0545  Weight: 94.3 kg (207 lb 14.3 oz) 97.8 kg (215 lb 9.8 oz) 97.3 kg (214 lb 8.1 oz)    History of present illness:  Shannon Garrett is a 52 year old female with a history of multiple sclerosis. She became dizzy and fell when attempting to stand up while getting out of her wheelchair. She remained on the floor for an uncertain period of time until her aide found her at 9 AM the next morning and brought her to the emergency department for evaluation. The patient complained of some issues with shortness of breath but this resolved in the emergency department. She also mentioned she was recently prescribed digoxin by her cardiologist. She has been on Lasix and Aldactone.  She was also recently prescribed a course of steroids for a possible multiple sclerosis flare. In the emergency department she was found to have EKG changes, and mild rhabdomyolysis with CK of 2396.  Hospital Course:  Fall  -Unclear etiology, patient is very poor historian. Probable orthostasis. -Was recently started on digoxin, she is on Lasix and Aldactone as well as Coreg.  -This is might be orthostatic hypotension secondary to the CHF medications.  -She will be discharged on 40 mg of Lasix daily (decreased dose) as well as her regular dose of spironolactone.   Fever  -Of unclear etiology, patient has clear urine, chest x-ray is okay. Blood cultures obtained and showed no growth to date -She has leukocytosis, improved slightly from 15.8 to 14.3 after initiation of antibiotics.  -Started empirically on vancomycin and Primaxin. She will be discharged on oral Levaquin for 5 more days.   Multiple sclerosis  -Patient has progressive relapsing Shannon, she has marked decline in her functional status over the past year.  -She was seen by neurology as an inpatient, who felt her symptoms were related to a multiple sclerosis exacerbation. They felt steroids were of no benefit. 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 -PT/OT evaluated the patient and recommended Skilled nursing facility, but unfortunately the patient refused. -Patient is on Symmetrel 100 mg BID. We will continuethis,  urology is evaluating the patient In outpatient followup. -Patient had an MRI showed extensive white patches consistent with Shannon, no acute strokes   Chronic CHF/nonischemic cardiomyopathy  -Seems to have  severe combined chronic CHF with LVEF of 20-25%, she does not have AICD.  -She is on Lasix, Aldactone, Coreg and lisinopril. Digoxin added recently  -Patient appears to be euvolemic, no dyspnea orthopnea.  -Lasix was held yesterday, she was given IV fluids because of obvious dehydration, stop IV  fluids and restart diuretics at decreased dose. She will need to be followed for her severe CHF closely.  Functional status  -As mentioned above evaluated by PT/OT recommended SNF placement. Patient refuses -Also seen by speech therapy and recommended dysphagia 3 (soft diet)  with thin liquids.  -Cognitive function improved since yesterday, wonder if that was secondary to the steroids She took recently.      Consultations:   neurology, Dr. Roseanne Reno  Discharge Exam: Filed Vitals:   09/07/12 1954 09/08/12 0502 09/08/12 0545 09/08/12 1000  BP: 123/75 125/73    Pulse: 94 99  98  Temp: 99.8 F (37.7 C) 98.4 F (36.9 C)    TempSrc: Oral Oral    Resp: 20 18    Height:      Weight:   97.3 kg (214 lb 8.1 oz)   SpO2: 100% 100%      General: Alert and oriented, no apparent distress, cooperative, smiling  Cardiovascular: Regular rate and rhythm, no murmurs rubs or gallops  Respiratory: Clear to auscultation, no wheezes crackles or rales Abdomen: Soft, nontender, nondistended, positive bowel sounds no masses  Discharge Instructions      Discharge Orders   Future Appointments [facility] Department Dept Phone   09/09/2012 2:15 PM Tonny Bollman, MD [facility] Main Office [place]) [phone]   09/18/2012 2:00 PM [facility] [id number]   09/29/2012 1:45 PM [facility] [phone]   Future Orders Complete By Expires     Diet - low sodium heart healthy  As directed     Increase activity slowly  As directed         Medication List    STOP taking these medications       zolpidem 5 MG tablet  Commonly known as:  AMBIEN      TAKE these medications       amantadine 100 MG capsule  Commonly known as:  SYMMETREL  Take 100 mg by mouth 2 (two) times daily.     carvedilol 6.25 MG tablet  Commonly known as:  COREG  Take 1 tablet (6.25 mg total) by mouth 2 (two) times daily  with a meal.     digoxin 0.25 MG tablet  Commonly known as:  LANOXIN  Take 1 tablet (0.25 mg total) by mouth daily.     furosemide 40 MG tablet  Commonly known as:  LASIX  Take 1 tablet (40 mg total) by mouth daily.     gabapentin 600 MG tablet  Commonly known as:  NEURONTIN  Take 600 mg by mouth 3 (three) times daily.     imipramine 50 MG tablet  Commonly known as:  TOFRANIL  Take 100 mg by mouth at bedtime.     levofloxacin 750 MG tablet  Commonly known as:  LEVAQUIN  Take 1 tablet (750 mg total) by mouth daily.  Start taking on:  09/09/2012     nitrofurantoin 100 MG capsule  Commonly known as:  MACRODANTIN  Take 100 mg by mouth daily.     potassium chloride SA 20 MEQ tablet  Commonly known as:  K-DUR,KLOR-CON  Take 40 mEq by  mouth daily.     solifenacin 10 MG tablet  Commonly known as:  VESICARE  Take 10 mg by mouth 2 (two) times daily.     spironolactone 25 MG tablet  Commonly known as:  ALDACTONE  Take 25 mg by mouth every morning.     temazepam 15 MG capsule  Commonly known as:  RESTORIL  Take 15 mg by mouth at bedtime.     Travoprost (BAK Free) 0.004 % Soln ophthalmic solution  Commonly known as:  TRAVATAN  Place 1 drop into both eyes at bedtime.       Follow-up Information   Follow up with Jeri Cos, MD. Schedule an appointment as soon as possible for a visit in 1 week.   Contact information:   [address] [place] [address] [phone]        The results of significant diagnostics from this hospitalization (including imaging, microbiology, ancillary and laboratory) are listed below for reference.    Significant Diagnostic Studies: Dg Chest 2 View  09/04/2012  *RADIOLOGY REPORT*  Clinical Data: Weakness.  History of fall.  CHEST - 2 VIEW  Comparison: Chest x-ray 06/06/2012.  Findings: Lung volumes are low.  No consolidative airspace disease. No pleural effusions.  No suspicious appearing pulmonary  nodules or masses are identified.  No pneumothorax.  Pulmonary vasculature is within normal limits.  Heart size is mildly enlarged (unchanged). The patient is rotated to the left on today's exam, resulting in distortion of the mediastinal contours and reduced diagnostic sensitivity and specificity for mediastinal pathology.  IMPRESSION: 1.  Low lung volumes without radiographic evidence of acute cardiopulmonary disease. 2.  Mild cardiomegaly.   Original Report Authenticated By: Trudie Reed, M.D.    Mr Laqueta Jean Wo Contrast  09/05/2012  *RADIOLOGY REPORT*  Clinical Data: History multiple sclerosis, cardiomyopathy and hypertension. Became weak and fell. Down possibly for 10 hours.  MRI HEAD WITHOUT AND WITH CONTRAST  Technique:  Multiplanar, multiecho pulse sequences of the brain and surrounding structures were obtained according to standard protocol without and with intravenous contrast  Contrast: 20mL MULTIHANCE GADOBENATE DIMEGLUMINE 529 MG/ML IV SOLN  Comparison: 06/01/2006.  Findings: Diffuse patchy white matter type changes most notable in the periventricular region although also seen in the subcortical region.  The number and size of white matter lesions have progressed since the prior examination indicating  progressive multiple sclerosis.  Areas which appear largest include the right parietal lobe and left mid frontal lobe periventricular area. These regions do not have significant associated mass effect as may be seen with PML (as may be seen as a complication of Shannon treatment). Recommend confirming stability with MR monitoring.  Involvement of the right motor strip by Shannon plaques once again noted causing T2 shine through on the diffusion sequence.  None of these Shannon plaques demonstrate restricted motion or enhancement as may be seen with acute areas of demyelination.  No acute infarct.  Right pituitary 1.2 x 1.1 x 1.2 cm mass. With the aid in retrospection, this was barely visualized on the prior  examination when this measured 1.2 x 0.7 x 1.1 cm. Minimal suprasellar extension and extension towards the right cavernous sinus.  No compression of the undersurface of the right optic nerves/optic chiasm. Infundibulum is deviated to the left.  This may represent a pituitary adenoma.  Mild exophthalmos otherwise orbital wall structures unremarkable.  No intracranial hemorrhage.  Posterior left parafalcine and 4.2 mm focal calcification versus tiny meningioma  without associated mass effect.  Findings without change.  Global atrophy without hydrocephalus.  Minimal paranasal sinus mucosal thickening with polypoid opacification inferior aspect right maxillary sinus.  Major intracranial vascular structures are patent.  IMPRESSION: Diffuse patchy white matter type changes most notable in the periventricular region although also seen in the subcortical region.  The number and size of white matter lesions have progressed since the prior examination indicating  progressive multiple sclerosis.  Areas which appear largest include the right parietal lobe and left mid frontal lobe periventricular area. These regions do not have significant associated mass effect as may be seen with PML (as may be seen as a complication of Shannon treatment). Recommend confirming stability with MR monitoring.  Involvement of the right motor strip by Shannon plaques once again noted causing T2 shine through on the diffusion sequence.  No MRl findings of acute demyelination.  No acute infarct.  Right pituitary 1.2 x 1.1 x 1.2 cm mass. With the aid in retrospection, this was barely visualized on the prior examination when this measured 1.2 x 0.7 x 1.1 cm.  This may represent a pituitary adenoma.  No intracranial hemorrhage.  Posterior left parafalcine and 4.2 mm focal calcification versus tiny meningioma without associated mass effect without change.  Global atrophy without hydrocephalus.   Original Report Authenticated By: Lacy Duverney, M.D.    Dg Chest  Port 1 View  09/06/2012  *RADIOLOGY REPORT*  Clinical Data: Fever.  PORTABLE CHEST - 1 VIEW  Comparison: PA and lateral chest 04/28/2012 and 09/04/2012.  CT chest 04/29/2012.  Findings: The patient is rotated on the study.  There is cardiomegaly.  Linear subsegmental atelectasis is seen in the lung bases.  Lungs otherwise clear.  No pneumothorax or pleural effusion.  IMPRESSION:  1.  Bibasilar subsegmental atelectasis. 2.  Cardiomegaly.   Original Report Authenticated By: Holley Dexter, M.D.    Ct Maxillofacial Wo Cm  09/04/2012  *RADIOLOGY REPORT*  Clinical Data: History of trauma from a fall with injury to the left eye.  CT MAXILLOFACIAL WITHOUT CONTRAST  Technique:  Multidetector CT imaging of the maxillofacial structures was performed. Multiplanar CT image reconstructions were also generated.  Comparison: Head CT 06/06/2012.  Findings: No acute displaced facial bone fractures are identified. Specifically, the left orbit is intact.  The left globe is intact and retrobulbar soft tissues are within normal limits.  A small amount of soft tissue swelling superficial to the left orbit is compatible with a contusion.  The mandible is intact, and the mandibular condyles are properly located.  Poor dentition with multiple missing teeth incidentally noted.  Small mucosal retention cyst or polyp in the inferior aspect of the right maxillary sinus is incidentally noted. Visualized intracranial contents are remarkable for atrophy and what appear to be chronic ischemic changes in the cerebral white matter (similar to prior).  IMPRESSION: 1.  Soft tissue contusion around the left orbit without evidence of acute displaced facial bone fracture or other signs of significant acute trauma.   Original Report Authenticated By: Trudie Reed, M.D.     Microbiology: Recent Results (from the past 240 hour(s))  URINE CULTURE     Status: None   Collection Time    09/05/12 12:35 PM      Result Value Range Status   Specimen  Description URINE, CATHETERIZED   Final   Special Requests NONE   Final   Culture  Setup Time 09/05/2012 13:14   Final   Colony Count NO GROWTH   Final  Culture NO GROWTH   Final   Report Status 09/06/2012 FINAL   Final  CULTURE, BLOOD (ROUTINE X 2)     Status: None   Collection Time    09/06/12 12:10 AM      Result Value Range Status   Specimen Description BLOOD LEFT HAND   Final   Special Requests BOTTLES DRAWN AEROBIC AND ANAEROBIC 10CC EACH   Final   Culture  Setup Time 09/06/2012 12:00   Final   Culture     Final   Value:        BLOOD CULTURE RECEIVED NO GROWTH TO DATE CULTURE WILL BE HELD FOR 5 DAYS BEFORE ISSUING A FINAL NEGATIVE REPORT   Report Status PENDING   Incomplete  CULTURE, BLOOD (ROUTINE X 2)     Status: None   Collection Time    09/06/12 12:20 AM      Result Value Range Status   Specimen Description BLOOD RIGHT ARM   Final   Special Requests BOTTLES DRAWN AEROBIC AND ANAEROBIC 10CC EACH   Final   Culture  Setup Time 09/06/2012 12:00   Final   Culture     Final   Value:        BLOOD CULTURE RECEIVED NO GROWTH TO DATE CULTURE WILL BE HELD FOR 5 DAYS BEFORE ISSUING A FINAL NEGATIVE REPORT   Report Status PENDING   Incomplete     Labs: Basic Metabolic Panel:  Recent Labs Lab 09/04/12 1217 09/04/12 1716 09/05/12 0700 09/06/12 0530 09/07/12 0530  NA 144  --  143 136 135  K 3.3*  --  3.3* 3.8 3.5  CL 98  --  102 97 98  CO2 36*  --  30 32 30  GLUCOSE 122*  --  151* 123* 105*  BUN 13  --  11 12 11   CREATININE 0.81 0.79 0.67 0.80 0.79  CALCIUM 9.7  --  9.1 8.9 8.5   Liver Function Tests:  Recent Labs Lab 09/04/12 1217 09/05/12 0700  AST 53* 82*  ALT 100* 81*  ALKPHOS 111 95  BILITOT 0.4 0.8  PROT 7.7 6.4  ALBUMIN 3.8 2.8*   CBC:  Recent Labs Lab 09/04/12 1217 09/04/12 1716 09/05/12 0700 09/06/12 0530 09/07/12 0530 09/08/12 1317  WBC 10.6* 9.1 13.6* 15.8* 14.3* 14.4*  NEUTROABS 9.2*  --   --   --   --   --   HGB 13.6 14.1 13.3 11.7* 9.7*  10.3*  HCT 37.3 39.5 37.2 33.2* 26.7* 27.9*  MCV 80.2 82.3 82.1 82.8 80.4 80.6  PLT 241 228 214 231 207 258   Cardiac Enzymes:  Recent Labs Lab 09/04/12 1217 09/04/12 1715 09/04/12 2149 09/05/12 0700  CKTOTAL 2396*  --   --   --   TROPONINI  --  <0.30 <0.30 <0.30     Signed:  Stephani Police, PA-C [facility] 09/08/2012, 2:38 PM

## 2012-09-08 NOTE — Clinical Social Work Note (Signed)
CSW consulted by RN re: patient need for ambulance. This CSW completed transportation form and called non emergent [facility] for patient transport home. CSW signing off, no other psychosocial concerns identified. Please re-consult as needed.  Adarrius Graeff, LCSWA [facility] Clinical Social Worker Contact #: [phone]  

## 2012-09-08 NOTE — Progress Notes (Signed)
Occupational Therapy Treatment Patient Details Name: Shannon Garrett MRN: [id number] DOB: 17-Jun-1961 Today's Date: 09/08/2012 Time: 6295-2841 OT Time Calculation (min): 23 min  OT Assessment / Plan / Recommendation Comments on Treatment Session This 52 yo female who has MS and fell at home and was on floor @ 10 hrs before she was found presents to acute OT with slow progress. Will benefit from continued OT at SNF.    Follow Up Recommendations  SNF       Equipment Recommendations  None recommended by OT       Frequency Min 2X/week   Plan Discharge plan remains appropriate    Precautions / Restrictions Precautions Precautions: Fall Restrictions Weight Bearing Restrictions: No       ADL  Eating/Feeding: Performed;Set up Where Assessed - Eating/Feeding: Bed level ADL Comments: Assessed pt for need of adaptive utensils for eating. Hand strength is good (4-4+/5); however arm strength is poor for sustained positioning (2- at shoulder). Used one pillow behind eat shoulder, one pillow under left arm folded, one pillow under right arm folded with towel folded underneath pillow--this gave her upper arms enough support for her to be able to feed herself with increased ease. Posted picture with instructions above pt's bed and talked with pt's nurse and NT today      OT Goals ADL Goals Pt Will Perform Eating: with modified independence;Supported;Supine, head of bed up;Sitting, chair;with adaptive utensils (or positioning) ADL Goal: Eating - Progress: Progressing toward goals  Visit Information  Last OT Received On: 09/08/12 Assistance Needed: +2    Subjective Data  Subjective: I wish I had seen you when I first got here (this works much better).     I propped her arms up at elbows to give her the support she needs to increase independence with self feeding      Cognition  Cognition Overall Cognitive Status: Appears within functional limits for tasks  assessed/performed Arousal/Alertness: Awake/alert Orientation Level: Appears intact for tasks assessed Behavior During Session: [name] for tasks performed       Exercises  Other Exercises Other Exercises: Worked with pt on performing 5 reps each of resistance exercises BIL UEs (shoulder flexion/extension, elbow flexion/extension, forearm supination/pronation)--could not due Bil wrists due to Bil IVs.      End of Session OT - End of Session Activity Tolerance: Patient tolerated treatment well Patient left: in bed;with call bell/phone within reach Nurse Communication:  (nurse and NT: positioning for meals)       Evette Georges [phone] 09/08/2012, 11:42 AM

## 2012-09-08 NOTE — Progress Notes (Signed)
1530 Foley catheter d/c as ordered.

## 2012-09-08 NOTE — Discharge Summary (Signed)
Addendum  Patient seen and examined, chart and data base reviewed.  I agree with the above assessment and discharge plan.  For full details please see Mrs. Algis Downs PA note.  Multiple sclerosis flareup with fever.  Treated empirically with antibiotics, discharged on Levaquin for 5 more days.   Clint Lipps, MD [facility] Pager: [phone] 09/08/2012, 3:48 PM

## 2012-09-08 NOTE — Progress Notes (Signed)
Physical Therapy Treatment Patient Details Name: Geoffrey Mankin MRN: [id number] DOB: [birth year] Today's Date: 09/08/2012 Time: 4098-1191 PT Time Calculation (min): 27 min  PT Assessment / Plan / Recommendation Comments on Treatment Session  Pt able to participate today but limited by extreme mm weakness, difficulty with trunk control and posture, pt motivated to stand but unable to hold trunk upright without assist at this time.    Follow Up Recommendations  SNF              Recommendations for Other Services OT consult  Frequency Min 3X/week   Plan Discharge plan remains appropriate;Frequency remains appropriate    Precautions / Restrictions Precautions Precautions: Fall Restrictions Weight Bearing Restrictions: No   Pertinent Vitals/Pain No c/o pain    Mobility  Bed Mobility Supine to Sit: 2: Max assist Details for Bed Mobility Assistance: able to use bar to assist wtih rolling, requires assist for B LEs and trunk    Exercises Other Exercises Other Exercises: Worked with pt on performing 5 reps each of resistance exercises BIL UEs (shoulder flexion/extension, elbow flexion/extension, forearm supination/pronation)--could not due Bil wrists due to Bil IVs.    PT Goals Acute Rehab PT Goals PT Goal: Supine/Side to Sit - Progress: Progressing toward goal PT Goal: Sit to Stand - Progress: Progressing toward goal PT Transfer Goal: Bed to Chair/Chair to Bed - Progress: Progressing toward goal  Visit Information  Last PT Received On: 09/08/12 Assistance Needed: +2 Reason Eval/Treat Not Completed: Fatigue/lethargy limiting ability to participate    Subjective Data  Subjective: I feel better than I did Patient Stated Goal: Get out of here   Cognition  Cognition Overall Cognitive Status: Appears within functional limits for tasks assessed/performed Arousal/Alertness: Awake/alert Orientation Level: Appears intact for tasks assessed Behavior During Session: [facility] for tasks  performed Cognition - Other Comments: decreased problem solving and impaired awareness of deficits    Balance  Dynamic Sitting Balance Dynamic Sitting - Balance Support: During functional activity Dynamic Sitting - Level of Assistance: 3: Mod assist Dynamic Sitting - Comments: Pt sat edge of bed x 20 minutes with supervision-mod A for exercises and self feeding task.  Pt performed wt shifts all directions with min-mod A for reaching task, scooting edge of bed with max-total A.  Seated for self feeding with min-mod A needed when pt required to use B UEs for self feeding/drinking tasks.  End of Session PT - End of Session Activity Tolerance: Patient limited by fatigue Patient left: in bed;with call bell/phone within reach;Other (comment);with bed alarm set (with pillows set up for self feeding) Nurse Communication: Mobility status        Eily Louvier 09/08/2012, 2:13 PM

## 2012-09-09 ENCOUNTER — Ambulatory Visit: Payer: PRIVATE HEALTH INSURANCE | Admitting: Cardiovascular Disease

## 2012-09-09 ENCOUNTER — Inpatient Hospital Stay (HOSPITAL_COMMUNITY)
Admission: EM | Admit: 2012-09-09 | Discharge: 2012-09-11 | DRG: 603 | Disposition: A | Discharge status: Skilled Nursing Facility | Payer: PRIVATE HEALTH INSURANCE | Attending: Internal Medicine | Admitting: Internal Medicine

## 2012-09-09 ENCOUNTER — Encounter (HOSPITAL_COMMUNITY): Payer: Self-pay | Admitting: Emergency Medicine

## 2012-09-09 DIAGNOSIS — E860 Dehydration: Secondary | ICD-10-CM | POA: Diagnosis present

## 2012-09-09 DIAGNOSIS — I5022 Chronic systolic (congestive) heart failure: Secondary | ICD-10-CM | POA: Diagnosis present

## 2012-09-09 DIAGNOSIS — Y92009 Unspecified place in unspecified non-institutional (private) residence as the place of occurrence of the external cause: Secondary | ICD-10-CM

## 2012-09-09 DIAGNOSIS — I5032 Chronic diastolic (congestive) heart failure: Secondary | ICD-10-CM | POA: Diagnosis present

## 2012-09-09 DIAGNOSIS — W1809XA Striking against other object with subsequent fall, initial encounter: Secondary | ICD-10-CM | POA: Diagnosis present

## 2012-09-09 DIAGNOSIS — L0390 Cellulitis, unspecified: Secondary | ICD-10-CM

## 2012-09-09 DIAGNOSIS — W19XXXA Unspecified fall, initial encounter: Secondary | ICD-10-CM

## 2012-09-09 DIAGNOSIS — Z79899 Other long term (current) drug therapy: Secondary | ICD-10-CM

## 2012-09-09 DIAGNOSIS — I5042 Chronic combined systolic (congestive) and diastolic (congestive) heart failure: Secondary | ICD-10-CM | POA: Diagnosis present

## 2012-09-09 DIAGNOSIS — D649 Anemia, unspecified: Secondary | ICD-10-CM | POA: Diagnosis present

## 2012-09-09 DIAGNOSIS — I428 Other cardiomyopathies: Secondary | ICD-10-CM | POA: Diagnosis present

## 2012-09-09 DIAGNOSIS — I10 Essential (primary) hypertension: Secondary | ICD-10-CM

## 2012-09-09 DIAGNOSIS — L03113 Cellulitis of right upper limb: Secondary | ICD-10-CM

## 2012-09-09 DIAGNOSIS — E43 Unspecified severe protein-calorie malnutrition: Secondary | ICD-10-CM

## 2012-09-09 DIAGNOSIS — D638 Anemia in other chronic diseases classified elsewhere: Secondary | ICD-10-CM | POA: Diagnosis present

## 2012-09-09 DIAGNOSIS — F3289 Other specified depressive episodes: Secondary | ICD-10-CM | POA: Diagnosis present

## 2012-09-09 DIAGNOSIS — D352 Benign neoplasm of pituitary gland: Secondary | ICD-10-CM | POA: Diagnosis present

## 2012-09-09 DIAGNOSIS — Z7409 Other reduced mobility: Secondary | ICD-10-CM

## 2012-09-09 DIAGNOSIS — I509 Heart failure, unspecified: Secondary | ICD-10-CM | POA: Diagnosis present

## 2012-09-09 DIAGNOSIS — I5023 Acute on chronic systolic (congestive) heart failure: Secondary | ICD-10-CM

## 2012-09-09 DIAGNOSIS — D539 Nutritional anemia, unspecified: Secondary | ICD-10-CM | POA: Diagnosis present

## 2012-09-09 DIAGNOSIS — G35D Multiple sclerosis, unspecified: Secondary | ICD-10-CM | POA: Diagnosis present

## 2012-09-09 DIAGNOSIS — IMO0002 Reserved for concepts with insufficient information to code with codable children: Principal | ICD-10-CM | POA: Diagnosis present

## 2012-09-09 DIAGNOSIS — R509 Fever, unspecified: Secondary | ICD-10-CM

## 2012-09-09 DIAGNOSIS — G8220 Paraplegia, unspecified: Secondary | ICD-10-CM

## 2012-09-09 DIAGNOSIS — R748 Abnormal levels of other serum enzymes: Secondary | ICD-10-CM

## 2012-09-09 DIAGNOSIS — W19XXXD Unspecified fall, subsequent encounter: Secondary | ICD-10-CM

## 2012-09-09 DIAGNOSIS — G35 Multiple sclerosis: Secondary | ICD-10-CM

## 2012-09-09 DIAGNOSIS — R799 Abnormal finding of blood chemistry, unspecified: Secondary | ICD-10-CM | POA: Diagnosis present

## 2012-09-09 DIAGNOSIS — F329 Major depressive disorder, single episode, unspecified: Secondary | ICD-10-CM | POA: Diagnosis present

## 2012-09-09 DIAGNOSIS — L259 Unspecified contact dermatitis, unspecified cause: Secondary | ICD-10-CM

## 2012-09-09 DIAGNOSIS — E669 Obesity, unspecified: Secondary | ICD-10-CM

## 2012-09-09 DIAGNOSIS — R071 Chest pain on breathing: Secondary | ICD-10-CM | POA: Diagnosis present

## 2012-09-09 DIAGNOSIS — D353 Benign neoplasm of craniopharyngeal duct: Secondary | ICD-10-CM | POA: Diagnosis present

## 2012-09-09 DIAGNOSIS — H409 Unspecified glaucoma: Secondary | ICD-10-CM | POA: Diagnosis present

## 2012-09-09 DIAGNOSIS — J9600 Acute respiratory failure, unspecified whether with hypoxia or hypercapnia: Secondary | ICD-10-CM

## 2012-09-09 LAB — BASIC METABOLIC PANEL
CO2: 31 mEq/L (ref 19–32)
Calcium: 9.5 mg/dL (ref 8.4–10.5)
Chloride: 96 mEq/L (ref 96–112)
Creatinine, Ser: 0.7 mg/dL (ref 0.50–1.10)
Glucose, Bld: 105 mg/dL — ABNORMAL HIGH (ref 70–99)
Sodium: 138 mEq/L (ref 135–145)

## 2012-09-09 LAB — URINALYSIS, ROUTINE W REFLEX MICROSCOPIC
Glucose, UA: NEGATIVE mg/dL
Leukocytes, UA: NEGATIVE
Nitrite: NEGATIVE
Specific Gravity, Urine: 1.016 (ref 1.005–1.030)
pH: 5.5 (ref 5.0–8.0)

## 2012-09-09 LAB — CBC
Hemoglobin: 10.8 g/dL — ABNORMAL LOW (ref 12.0–15.0)
MCH: 29.4 pg (ref 26.0–34.0)
MCV: 83.9 fL (ref 78.0–100.0)
Platelets: 376 10*3/uL (ref 150–400)
RBC: 3.67 MIL/uL — ABNORMAL LOW (ref 3.87–5.11)
WBC: 18.7 10*3/uL — ABNORMAL HIGH (ref 4.0–10.5)

## 2012-09-09 LAB — CK: Total CK: 5511 U/L — ABNORMAL HIGH (ref 7–177)

## 2012-09-09 MED ORDER — SODIUM CHLORIDE 0.9 % IV SOLN
INTRAVENOUS | Status: DC
  Administered 2012-09-09: 23:00:00 via INTRAVENOUS

## 2012-09-09 MED ORDER — ACETAMINOPHEN 325 MG PO TABS: 650.0000 mg | ORAL_TABLET | Freq: Four times a day (QID) | ORAL | Status: DC | PRN

## 2012-09-09 MED ORDER — HYDROCODONE-ACETAMINOPHEN 5-325 MG PO TABS
1.0000 | ORAL_TABLET | Freq: Four times a day (QID) | ORAL | Status: DC | PRN
  Administered 2012-09-09 – 2012-09-10 (×2): 1 via ORAL
  Filled 2012-09-09 (×2): qty 1

## 2012-09-09 MED ORDER — ONDANSETRON HCL 4 MG/2ML IJ SOLN: 4.0000 mg | Freq: Three times a day (TID) | INTRAMUSCULAR | Status: DC | PRN

## 2012-09-09 MED ORDER — MORPHINE SULFATE 2 MG/ML IJ SOLN: 2.0000 mg | INTRAMUSCULAR | Status: DC | PRN

## 2012-09-09 MED ORDER — SODIUM CHLORIDE 0.9 % IV BOLUS (SEPSIS)
1000.0000 mL | Freq: Once | INTRAVENOUS | Status: AC
  Administered 2012-09-09: 1000 mL via INTRAVENOUS

## 2012-09-09 NOTE — H&P (Signed)
History and Physical  Shannon Garrett XLK:[id number] DOB: 04-Jun-1961 DOA: 09/09/2012  Referring physician: Renne Crigler, PA-C PCP: Marletta Lor, NP   Chief Complaint: Fall  HPI:  52 year old woman just discharged one day prior presented with history of fall at home. Initial workup notable for elevated CK, leukocytosis, suspected developing cellulitis right upper extremity.  Patient with history of multiple sclerosis and recently hospitalized for fever of unclear etiology treated with antibiotics and discharged on Levaquin as well as a fall. Skilled nursing facility was recommended the patient declined. She was discharged 3/10 and on home. At home she fell in her bedroom near her bed landing on a trapeze bar that extends under the bed. She was unable to get up and laid there all night. This morning her aide came in and the patient was subsequently transported to the emergency department.  She complains of pain under her left breast where she was lying on the trapeze railing. No pain in her right arm. No new complaints otherwise. She would like to go to a skilled nursing facility.  In ED  Afebrile, VSS  Screening labs--WBC 18.7  IVF given  Chart Review:  Discharged 3/10: fall, orthostasis?, fever treated with Levaquin  Review of Systems:  Negative for fever, visual changes, sore throat, rash, chest pain, SOB, dysuria, bleeding, n/v/abdominal pain.  Positive for pain in her left breast. No right arm pain.  Past Medical History  Diagnosis Date  . MS (multiple sclerosis)     a. Dx'd late 20's. b. Tx with Novantrone, Tysabri, Copaxone previously.  Marland Kitchen Hypertension   . Depression   . Glaucoma(365)   . Cardiomyopathy     a.  Echo 04/29/12: Mild LVH, EF 20-25%, mild AI, moderate MR, moderate LAE, mild RAE, mild RVE, moderate TR, PASP 51, small pericardial effusion;   b. probably non-ischemic given multiple chemo-Tx agents used for MS and global LV dysfn on echo  . Chronic systolic heart  failure   . CHF (congestive heart failure)   . Shortness of breath   . Neuromuscular disorder     MS    Past Surgical History  Procedure Laterality Date  . Ablation      uterine  . Cesarean section      Social History:  reports that she has never smoked. She has never used smokeless tobacco. She reports that she does not drink alcohol or use illicit drugs.  Allergies  Allergen Reactions  . Sulfonamide Derivatives Shortness Of Breath  . Penicillins Hives and Itching    Family History  Problem Relation Age of Onset  . Hypertension Mother      Prior to Admission medications   Medication Sig Start Date End Date Taking? Authorizing Provider  amantadine (SYMMETREL) 100 MG capsule Take 100 mg by mouth 2 (two) times daily.    Yes Historical Provider, MD  carvedilol (COREG) 6.25 MG tablet Take 1 tablet (6.25 mg total) by mouth 2 (two) times daily with a meal. 08/01/12  Yes Dolores Patty, MD  digoxin (LANOXIN) 0.25 MG tablet Take 1 tablet (0.25 mg total) by mouth daily. 08/11/12  Yes Hadassah Pais, PA-C  furosemide (LASIX) 40 MG tablet Take 1 tablet (40 mg total) by mouth daily. 09/08/12  Yes Marianne L York, PA-C  gabapentin (NEURONTIN) 600 MG tablet Take 600 mg by mouth 3 (three) times daily.    Yes Historical Provider, MD  imipramine (TOFRANIL) 50 MG tablet Take 100 mg by mouth at bedtime.     Yes  Historical Provider, MD  nitrofurantoin (MACRODANTIN) 100 MG capsule Take 100 mg by mouth daily.   Yes Historical Provider, MD  potassium chloride SA (K-DUR,KLOR-CON) 20 MEQ tablet Take 40 mEq by mouth daily.  06/04/12  Yes Tonny Bollman, MD  solifenacin (VESICARE) 10 MG tablet Take 10 mg by mouth 2 (two) times daily.    Yes Historical Provider, MD  spironolactone (ALDACTONE) 25 MG tablet Take 25 mg by mouth every morning.   Yes Historical Provider, MD  Travoprost, BAK Free, (TRAVATAN) 0.004 % SOLN ophthalmic solution Place 1 drop into both eyes at bedtime.    Yes Historical Provider,  MD  levofloxacin (LEVAQUIN) 750 MG tablet Take 1 tablet (750 mg total) by mouth daily. 09/09/12   Tora Kindred York, PA-C  predniSONE (DELTASONE) 20 MG tablet Take 10-100 mg by mouth daily. Take 5 tablets daily for 2 days; take 3 tablets daily for 2 days; take 2 tablets daily for 2 days; take 1 tablet daily for 2 days; take 1/2 tablet daily for 2 days.    Historical Provider, MD  temazepam (RESTORIL) 15 MG capsule Take 15 mg by mouth at bedtime.    Historical Provider, MD   Physical Exam: Filed Vitals:   09/09/12 1519 09/09/12 1838  BP: 129/70 113/96  Pulse: 96 95  Temp: 99.2 F (37.3 C) 99.7 F (37.6 C)  TempSrc: Oral Rectal  Resp: 16 11  SpO2: 100% 100%    General:   Appears calm and comfortable Eyes: PERRL, normal lids, irises ENT: grossly normal hearing, lips & tongue Neck: no LAD, masses or thyromegaly Cardiovascular: RRR, no m/r/g. No LE edema. Respiratory: CTA bilaterally, no w/r/r. Normal respiratory effort. Abdomen: soft, ntnd Skin: Macerated skin in an abrasion on her left breast with early bruising laterally extending around to the back. No erythema or edema. Musculoskeletal: grossly normal tone BUE. Bilateral lower extremities weak. Psychiatric: grossly normal mood and affect, speech fluent and appropriate Neurologic: grossly non-focal.  Wt Readings from Last 3 Encounters:  09/08/12 97.3 kg (214 lb 8.1 oz)  08/27/12 93.101 kg (205 lb 4 oz)  08/11/12 92.194 kg (203 lb 4 oz)    Labs on Admission:  Basic Metabolic Panel:  Recent Labs Lab 09/05/12 0700 09/06/12 0530 09/07/12 0530 09/08/12 1317 09/09/12 1721  NA 143 136 135 136 138  K 3.3* 3.8 3.5 3.6 3.7  CL 102 97 98 97 96  CO2 30 32 30 30 31   GLUCOSE 151* 123* 105* 105* 105*  BUN 11 12 11 11 18   CREATININE 0.67 0.80 0.79 0.73 0.70  CALCIUM 9.1 8.9 8.5 8.8 9.5    Liver Function Tests:  Recent Labs Lab 09/04/12 1217 09/05/12 0700  AST 53* 82*  ALT 100* 81*  ALKPHOS 111 95  BILITOT 0.4 0.8  PROT  7.7 6.4  ALBUMIN 3.8 2.8*    CBC:  Recent Labs Lab 09/04/12 1217  09/05/12 0700 09/06/12 0530 09/07/12 0530 09/08/12 1317 09/09/12 1721  WBC 10.6*  < > 13.6* 15.8* 14.3* 14.4* 18.7*  NEUTROABS 9.2*  --   --   --   --   --   --   HGB 13.6  < > 13.3 11.7* 9.7* 10.3* 10.8*  HCT 37.3  < > 37.2 33.2* 26.7* 27.9* 30.8*  MCV 80.2  < > 82.1 82.8 80.4 80.6 83.9  PLT 241  < > 214 231 207 258 376  < > = values in this interval not displayed.  Cardiac Enzymes:  Recent Labs Lab  09/04/12 1217 09/04/12 1715 09/04/12 2149 09/05/12 0700 09/09/12 1720  CKTOTAL 2396*  --   --   --  5511*  TROPONINI  --  <0.30 <0.30 <0.30  --      Recent Labs  04/28/12 1658 04/30/12 0455  PROBNP 16405.0* 10618.0*     Principal Problem:   Fall Active Problems:   ANEMIA, CHRONIC   MULTIPLE SCLEROSIS, PROGRESSIVE/RELAPSING   Chronic systolic heart failure   Elevated CK   Cellulitis of right upper extremity   Assessment/Plan 1. Fall at home: Secondary to acute weakness from multiple sclerosis. Will need skilled nursing facility. 2. Elevated CK: Creatinine currently preserved. Gentle IV fluids. Recheck CK in the morning. 3. Suspected right upper extremity cellulitis: IV antibiotics. Mark border.  4. Wound under left breast with associated chest wall pain: Local wound care. Check rib films. 5. Anemia: etiology unclear. Stable. 6. Multiple sclerosis 7. Nonischemic cardiomyopathy: Appears compensated. Continue digoxin, Aldactone, Coreg. Echo 04/29/12: Mild LVH, EF 20-25%, mild AI, moderate MR, moderate LAE, mild RAE, mild RVE, moderate TR, PASP 51, small pericardial effusion; b. probably non-ischemic given multiple chemo-Tx agents used for MS and global LV dysfn on echo  8. Possible pituitary adenoma discovered last admission: Followup as an outpatient.   Code Status: Full code  Family Communication: None present  Disposition Plan/Anticipated LOS: Admit, 1-2 days. Skilled nursing  facility  Time spent: 50 minutes  Brendia Sacks, MD  [facility] Pager [phone] 09/09/2012, 7:52 PM

## 2012-09-09 NOTE — ED Notes (Signed)
[id number]<AV> Expected date:<BR> Expected time:<BR> Means of arrival:<BR> Comments:<BR> gen weakness

## 2012-09-09 NOTE — ED Provider Notes (Signed)
Medical screening examination/treatment/procedure(s) were performed by non-physician practitioner and as supervising physician I was immediately available for consultation/collaboration.  Anthony T Allen, MD 09/09/12 2338 

## 2012-09-09 NOTE — ED Provider Notes (Signed)
History     CSN: [id number]  Arrival date & time 09/09/12  1510   First MD Initiated Contact with Patient 09/09/12 1709      Chief Complaint  Patient presents with  . Fatigue    (Consider location/radiation/quality/duration/timing/severity/associated sxs/prior treatment) HPI Comments: Patient with history of multiple sclerosis with rapid decline over the past several months presents with complaints of fatigue, weakness, fall. Patient was admitted to the hospital until yesterday after she had a fall and subsequent fever. She was offered skilled nursing however refused. Patient states that she lives by herself and when she went home last night she had a fall onto metal bars that she uses to help pull herself to standing. She was unable to get up and lied on it long until she was found by her aide this morning. She currently has some chest wall pain from where she was lying on the bar. She states that she wants to be considered for skilled nursing. Patient denies other symptoms however has developed some redness and warmth of her right upper arm. She denies fever, nausea, vomiting or diarrhea. She's not been able to eat or drink anything because she fell. Onset of symptoms acute. Course is constant. Nothing makes symptoms better or worse.  The history is provided by the patient.    Past Medical History  Diagnosis Date  . MS (multiple sclerosis)     a. Dx'd late 20's. b. Tx with Novantrone, Tysabri, Copaxone previously.  Marland Kitchen Hypertension   . Depression   . Glaucoma(365)   . Cardiomyopathy     a.  Echo 04/29/12: Mild LVH, EF 20-25%, mild AI, moderate MR, moderate LAE, mild RAE, mild RVE, moderate TR, PASP 51, small pericardial effusion;   b. probably non-ischemic given multiple chemo-Tx agents used for MS and global LV dysfn on echo  . Chronic systolic heart failure   . CHF (congestive heart failure)   . Shortness of breath   . Neuromuscular disorder     MS    Past Surgical History   Procedure Laterality Date  . Ablation      uterine  . Cesarean section      Family History  Problem Relation Age of Onset  . Hypertension Mother     History  Substance Use Topics  . Smoking status: Never Smoker   . Smokeless tobacco: Never Used  . Alcohol Use: No    OB History   Grav Para Term Preterm Abortions TAB SAB Ect Mult Living                  Review of Systems  Constitutional: Positive for fatigue. Negative for fever.  HENT: Negative for sore throat and rhinorrhea.   Eyes: Negative for redness.  Respiratory: Negative for cough.   Cardiovascular: Positive for chest pain (chest wall pain). Negative for leg swelling.  Gastrointestinal: Negative for nausea, vomiting, abdominal pain and diarrhea.  Genitourinary: Negative for dysuria.  Musculoskeletal: Negative for myalgias.  Skin: Positive for color change and rash.  Neurological: Positive for weakness. Negative for headaches.    Allergies  Sulfonamide derivatives and Penicillins  Home Medications   Current Outpatient Rx  Name  Route  Sig  Dispense  Refill  . amantadine (SYMMETREL) 100 MG capsule   Oral   Take 100 mg by mouth 2 (two) times daily.          . carvedilol (COREG) 6.25 MG tablet   Oral   Take 1 tablet (6.25 mg total)  by mouth 2 (two) times daily with a meal.   60 tablet   3   . digoxin (LANOXIN) 0.25 MG tablet   Oral   Take 1 tablet (0.25 mg total) by mouth daily.         . furosemide (LASIX) 40 MG tablet   Oral   Take 1 tablet (40 mg total) by mouth daily.   30 tablet   1   . gabapentin (NEURONTIN) 600 MG tablet   Oral   Take 600 mg by mouth 3 (three) times daily.          Marland Kitchen imipramine (TOFRANIL) 50 MG tablet   Oral   Take 100 mg by mouth at bedtime.           . nitrofurantoin (MACRODANTIN) 100 MG capsule   Oral   Take 100 mg by mouth daily.         . potassium chloride SA (K-DUR,KLOR-CON) 20 MEQ tablet   Oral   Take 40 mEq by mouth daily.    1 tablet   0    . solifenacin (VESICARE) 10 MG tablet   Oral   Take 10 mg by mouth 2 (two) times daily.          Marland Kitchen spironolactone (ALDACTONE) 25 MG tablet   Oral   Take 25 mg by mouth every morning.         . Travoprost, BAK Free, (TRAVATAN) 0.004 % SOLN ophthalmic solution   Both Eyes   Place 1 drop into both eyes at bedtime.          Marland Kitchen levofloxacin (LEVAQUIN) 750 MG tablet   Oral   Take 1 tablet (750 mg total) by mouth daily.   5 tablet   0   . predniSONE (DELTASONE) 20 MG tablet   Oral   Take 10-100 mg by mouth daily. Take 5 tablets daily for 2 days; take 3 tablets daily for 2 days; take 2 tablets daily for 2 days; take 1 tablet daily for 2 days; take 1/2 tablet daily for 2 days.         . temazepam (RESTORIL) 15 MG capsule   Oral   Take 15 mg by mouth at bedtime.           BP 129/70  Pulse 96  Temp(Src) 99.2 F (37.3 C) (Oral)  Resp 16  SpO2 100%  Physical Exam  Nursing note and vitals reviewed. Constitutional: She appears well-developed and well-nourished.  HENT:  Head: Normocephalic and atraumatic.  Mouth/Throat: Mucous membranes are not pale and dry.  Eyes: Conjunctivae are normal. Right eye exhibits no discharge. Left eye exhibits no discharge.  Neck: Normal range of motion. Neck supple.  Cardiovascular: Normal rate, regular rhythm and normal heart sounds.   Pulmonary/Chest: Effort normal and breath sounds normal.  Abdominal: Soft. There is no tenderness.  Neurological: She is alert.  Skin: Skin is warm and dry.  Ill-defined area of erythema several centimeters in diameter on right upper arm with warmth and mild tenderness to palpation.  Psychiatric: She has a normal mood and affect.    ED Course  Procedures (including critical care time)  Labs Reviewed  CBC - Abnormal; Notable for the following:    WBC 18.7 (*)    RBC 3.67 (*)    Hemoglobin 10.8 (*)    HCT 30.8 (*)    All other components within normal limits  BASIC METABOLIC PANEL - Abnormal;  Notable for the following:  Glucose, Bld 105 (*)    All other components within normal limits  CK - Abnormal; Notable for the following:    Total CK 5511 (*)    All other components within normal limits  URINALYSIS, ROUTINE W REFLEX MICROSCOPIC   No results found.   1. Fall, initial encounter   2. Dehydration   3. Cellulitis    Patient seen and examined. Work-up initiated.   Vital signs reviewed and are as follows: Filed Vitals:   09/09/12 1838  BP: 113/96  Pulse: 95  Temp: 99.7 F (37.6 C)  Resp: 11   Spoke with Dr. Freida Busman. SW has seen. Patient can likely be placed tomorrow.   Spoke with Dr. Irene Limbo who will see and admit.    MDM  Patient with dehydration, weakness, fall. Will need SNF when stabilized.         Renne Crigler, PA-C 09/09/12 2022

## 2012-09-09 NOTE — ED Notes (Signed)
Per EMS: was just discharged from [facility] on yesterday now she states she is weak and cannot walk. Fell earlier this morning but did not seek medical attention. Pt states she wants rehab.

## 2012-09-09 NOTE — Progress Notes (Signed)
CSW met with pt at bedside to discuss pt current csw needs. Pt reports that she was discharged from [facility] yesterday and wanted to return home. Pt reports she did not realize how difficult caring for herself was going tobe. Pt reports fall at home last night and unable to get up until her aid arrived this morning. Pt reports that she is interested in going to skilled nursing. Per chart review, pt insurance will be able to assist with payment for short term rehab. CSW discussed with EDP who feels that patient is not safe to return home. CSW will initiate snf process for placement. CSW awaiting futther evaluation to determine pt admission status.

## 2012-09-09 NOTE — Clinical Social Work Placement (Addendum)
    Clinical Social Work Department CLINICAL SOCIAL WORK PLACEMENT NOTE 09/09/2012  Patient:  Shannon Garrett, Shannon Garrett  Account Number:  [id number] Admit date:  09/09/2012  Clinical Social Worker:  Doree Albee  Date/time:  09/09/2012 07:00 PM  Clinical Social Work is seeking post-discharge placement for this patient at the following level of care:   SKILLED NURSING   (*CSW will update this form in Epic as items are completed)   09/09/2012  Patient/family provided with [facility] Department of Clinical Social Work's list of facilities offering this level of care within the geographic area requested by the patient (or if unable, by the patient's family).  09/09/2012  Patient/family informed of their freedom to choose among providers that offer the needed level of care, that participate in Medicare, Medicaid or managed care program needed by the patient, have an available bed and are willing to accept the patient.  09/09/2012  Patient/family informed of [facility]' ownership interest in [facility], as well as of the fact that they are under no obligation to receive care at this facility.  PASARR submitted to EDS on 09/10/2012 PASARR number received from EDS on   FL2 transmitted to all facilities in geographic area requested by pt/family on 09/10/2012  FL2 transmitted to all facilities within larger geographic area on   Patient informed that his/her managed care company has contracts with or will negotiate with  certain facilities, including the following:     Patient/family informed of bed offers received: 09/10/2012  Patient chooses bed at [facility] Physician recommends and patient chooses bed at    Patient to be transferred to  on  [facility] on 09/11/2012 Patient to be transferred to facility by ambulance Sharin Mons)  The following physician request were entered in Epic:   Additional Comments:  Jacklynn Lewis, MSW, LCSWA  Clinical Social Work [phone]

## 2012-09-09 NOTE — ED Notes (Signed)
Pt given cup of gingerale °

## 2012-09-10 ENCOUNTER — Inpatient Hospital Stay (HOSPITAL_COMMUNITY): Payer: PRIVATE HEALTH INSURANCE

## 2012-09-10 DIAGNOSIS — R748 Abnormal levels of other serum enzymes: Secondary | ICD-10-CM

## 2012-09-10 DIAGNOSIS — I5023 Acute on chronic systolic (congestive) heart failure: Secondary | ICD-10-CM

## 2012-09-10 DIAGNOSIS — L03113 Cellulitis of right upper limb: Secondary | ICD-10-CM

## 2012-09-10 LAB — CBC
HCT: 27.6 % — ABNORMAL LOW (ref 36.0–46.0)
MCV: 84.4 fL (ref 78.0–100.0)
Platelets: 349 10*3/uL (ref 150–400)
RBC: 3.27 MIL/uL — ABNORMAL LOW (ref 3.87–5.11)
RDW: 14.5 % (ref 11.5–15.5)
WBC: 16 10*3/uL — ABNORMAL HIGH (ref 4.0–10.5)

## 2012-09-10 LAB — BASIC METABOLIC PANEL
CO2: 30 mEq/L (ref 19–32)
Chloride: 97 mEq/L (ref 96–112)
Creatinine, Ser: 0.66 mg/dL (ref 0.50–1.10)
GFR calc Af Amer: >90 mL/min (ref >90)
Sodium: 135 mEq/L (ref 135–145)

## 2012-09-10 LAB — CK: Total CK: 3475 U/L — ABNORMAL HIGH (ref 7–177)

## 2012-09-10 MED ORDER — TEMAZEPAM 15 MG PO CAPS
15.0000 mg | ORAL_CAPSULE | Freq: Every day | ORAL | Status: DC
  Administered 2012-09-10: 15 mg via ORAL
  Filled 2012-09-10: qty 1

## 2012-09-10 MED ORDER — SODIUM CHLORIDE 0.9 % IV SOLN
INTRAVENOUS | Status: AC
  Administered 2012-09-10: 04:00:00 via INTRAVENOUS

## 2012-09-10 MED ORDER — CARVEDILOL 6.25 MG PO TABS
6.2500 mg | ORAL_TABLET | Freq: Two times a day (BID) | ORAL | Status: DC
  Administered 2012-09-10 – 2012-09-11 (×3): 6.25 mg via ORAL
  Filled 2012-09-10 (×5): qty 1

## 2012-09-10 MED ORDER — SPIRONOLACTONE 25 MG PO TABS
25.0000 mg | ORAL_TABLET | Freq: Every morning | ORAL | Status: DC
  Administered 2012-09-10: 25 mg via ORAL
  Filled 2012-09-10: qty 1

## 2012-09-10 MED ORDER — DARIFENACIN HYDROBROMIDE ER 7.5 MG PO TB24
7.5000 mg | ORAL_TABLET | Freq: Every day | ORAL | Status: DC
  Administered 2012-09-10 – 2012-09-11 (×2): 7.5 mg via ORAL
  Filled 2012-09-10 (×2): qty 1

## 2012-09-10 MED ORDER — SODIUM CHLORIDE 0.9 % IV SOLN: 250.0000 mL | INTRAVENOUS | Status: DC | PRN

## 2012-09-10 MED ORDER — IMIPRAMINE HCL 50 MG PO TABS
100.0000 mg | ORAL_TABLET | Freq: Every day | ORAL | Status: DC
  Administered 2012-09-10: 100 mg via ORAL
  Filled 2012-09-10 (×2): qty 2

## 2012-09-10 MED ORDER — ONDANSETRON HCL 4 MG/2ML IJ SOLN: 4.0000 mg | Freq: Four times a day (QID) | INTRAMUSCULAR | Status: DC | PRN

## 2012-09-10 MED ORDER — SPIRONOLACTONE 25 MG PO TABS
25.0000 mg | ORAL_TABLET | Freq: Every day | ORAL | Status: DC
  Administered 2012-09-11: 25 mg via ORAL
  Filled 2012-09-10: qty 1

## 2012-09-10 MED ORDER — SODIUM CHLORIDE 0.9 % IJ SOLN
3.0000 mL | Freq: Two times a day (BID) | INTRAMUSCULAR | Status: DC
  Administered 2012-09-10: 3 mL via INTRAVENOUS

## 2012-09-10 MED ORDER — POTASSIUM CHLORIDE CRYS ER 20 MEQ PO TBCR
40.0000 meq | EXTENDED_RELEASE_TABLET | Freq: Once | ORAL | Status: AC
  Administered 2012-09-10: 40 meq via ORAL
  Filled 2012-09-10: qty 2

## 2012-09-10 MED ORDER — DIGOXIN 250 MCG PO TABS
0.2500 mg | ORAL_TABLET | Freq: Every day | ORAL | Status: DC
  Administered 2012-09-10 – 2012-09-11 (×2): 0.25 mg via ORAL
  Filled 2012-09-10 (×2): qty 1

## 2012-09-10 MED ORDER — POTASSIUM CHLORIDE CRYS ER 20 MEQ PO TBCR
40.0000 meq | EXTENDED_RELEASE_TABLET | Freq: Every day | ORAL | Status: DC
  Administered 2012-09-10 – 2012-09-11 (×2): 40 meq via ORAL
  Filled 2012-09-10 (×2): qty 2

## 2012-09-10 MED ORDER — VANCOMYCIN HCL 10 G IV SOLR
1250.0000 mg | Freq: Two times a day (BID) | INTRAVENOUS | Status: DC
  Administered 2012-09-10 – 2012-09-11 (×3): 1250 mg via INTRAVENOUS
  Filled 2012-09-10 (×4): qty 1250

## 2012-09-10 MED ORDER — TRAVOPROST (BAK FREE) 0.004 % OP SOLN
1.0000 [drp] | Freq: Every day | OPHTHALMIC | Status: DC
  Administered 2012-09-10: 1 [drp] via OPHTHALMIC
  Filled 2012-09-10: qty 2.5

## 2012-09-10 MED ORDER — VANCOMYCIN HCL IN DEXTROSE 1-5 GM/200ML-% IV SOLN
1000.0000 mg | Freq: Once | INTRAVENOUS | Status: AC
  Administered 2012-09-10: 1000 mg via INTRAVENOUS
  Filled 2012-09-10: qty 200

## 2012-09-10 MED ORDER — GABAPENTIN 300 MG PO CAPS
600.0000 mg | ORAL_CAPSULE | Freq: Three times a day (TID) | ORAL | Status: DC
  Administered 2012-09-10 – 2012-09-11 (×4): 600 mg via ORAL
  Filled 2012-09-10 (×6): qty 2

## 2012-09-10 MED ORDER — ONDANSETRON HCL 4 MG PO TABS: 4.0000 mg | ORAL_TABLET | Freq: Four times a day (QID) | ORAL | Status: DC | PRN

## 2012-09-10 MED ORDER — SODIUM CHLORIDE 0.9 % IJ SOLN: 3.0000 mL | INTRAMUSCULAR | Status: DC | PRN

## 2012-09-10 MED ORDER — VANCOMYCIN HCL IN DEXTROSE 1-5 GM/200ML-% IV SOLN: 1000.0000 mg | Freq: Three times a day (TID) | INTRAVENOUS | Status: DC

## 2012-09-10 MED ORDER — AMANTADINE HCL 100 MG PO CAPS
100.0000 mg | ORAL_CAPSULE | Freq: Two times a day (BID) | ORAL | Status: DC
  Administered 2012-09-10 – 2012-09-11 (×3): 100 mg via ORAL
  Filled 2012-09-10 (×4): qty 1

## 2012-09-10 MED ORDER — ENOXAPARIN SODIUM 40 MG/0.4ML ~~LOC~~ SOLN
40.0000 mg | SUBCUTANEOUS | Status: DC
  Administered 2012-09-10 – 2012-09-11 (×2): 40 mg via SUBCUTANEOUS
  Filled 2012-09-10 (×2): qty 0.4

## 2012-09-10 NOTE — Care Management (Signed)
UR complete 

## 2012-09-10 NOTE — Clinical Social Work Psychosocial (Addendum)
    Clinical Social Work Department BRIEF PSYCHOSOCIAL ASSESSMENT 09/10/2012  Patient:  Shannon Garrett, Shannon Garrett     Account Number:  [id number]     Admit date:  09/09/2012  Clinical Social Worker:  Doree Albee  Date/Time:  09/09/2012 06:50 PM  Referred by:    Date Referred:  09/09/2012 Referred for  SNF Placement   Other Referral:   Interview type:  Patient Other interview type:    PSYCHOSOCIAL DATA Living Status:  ALONE Admitted from facility:   Level of care:   Primary support name:   Primary support relationship to patient:   Degree of support available:   none    CURRENT CONCERNS Current Concerns  Post-Acute Placement   Other Concerns:    SOCIAL WORK ASSESSMENT / PLAN CSW recieved referral from PA regarding [facility] being [facility] after refusing [facility]f placement. Pt fell on the floor duing the night and was found by aid the next morning, unsure of how long patient was on floor.    CSW met with pt at bedside to complete psychosocial assessment. CSW introduced self and CSW role. Pt shared that she felt she could return home alone however realizes that she needs more assistance. Pt states she is open to snf placement in [employer]. Pt apologizes for not going to skilled nursing when discharged yesterdya.    CSW will initiate snf search please see placement note for progress of placement.   Assessment/plan status:   Other assessment/ plan:   Information/referral to community resources:   skilled nursing facilities    PATIENT'S/FAMILY'S RESPONSE TO PLAN OF CARE: Pt thanked csw for concern and support. Pt plans to discharge to short term rehab before returning home.

## 2012-09-10 NOTE — Progress Notes (Signed)
ANTIBIOTIC CONSULT NOTE - INITIAL  Pharmacy Consult for vancomycin Indication: cellulitis right upper extremity   Allergies  Allergen Reactions  . Sulfonamide Derivatives Shortness Of Breath  . Penicillins Hives and Itching    Patient Measurements: Height: 5\' 5"  (165.1 cm) Weight: 203 lb 0.7 oz (92.1 kg) IBW/kg (Calculated) : 57 Adjusted Body Weight:   Vital Signs: Temp: 98.3 F (36.8 C) (03/11 2110) Temp src: Rectal (03/11 1838) BP: 111/77 mmHg (03/11 2110) Pulse Rate: 83 (03/11 2110) Intake/Output from previous day:   Intake/Output from this shift:    Labs:  Recent Labs  09/07/12 0530 09/08/12 1317 09/09/12 1721  WBC 14.3* 14.4* 18.7*  HGB 9.7* 10.3* 10.8*  PLT 207 258 376  CREATININE 0.79 0.73 0.70   Estimated Creatinine Clearance: 95.3 ml/min (by C-G formula based on Cr of 0.7). No results found for this basename: VANCOTROUGH, VANCOPEAK, VANCORANDOM, GENTTROUGH, GENTPEAK, GENTRANDOM, TOBRATROUGH, TOBRAPEAK, TOBRARND, AMIKACINPEAK, AMIKACINTROU, AMIKACIN,  in the last 72 hours   Microbiology: Recent Results (from the past 720 hour(s))  URINE CULTURE     Status: None   Collection Time    09/05/12 12:35 PM      Result Value Range Status   Specimen Description URINE, CATHETERIZED   Final   Special Requests NONE   Final   Culture  Setup Time 09/05/2012 13:14   Final   Colony Count NO GROWTH   Final   Culture NO GROWTH   Final   Report Status 09/06/2012 FINAL   Final  CULTURE, BLOOD (ROUTINE X 2)     Status: None   Collection Time    09/06/12 12:10 AM      Result Value Range Status   Specimen Description BLOOD LEFT HAND   Final   Special Requests BOTTLES DRAWN AEROBIC AND ANAEROBIC 10CC EACH   Final   Culture  Setup Time 09/06/2012 12:00   Final   Culture     Final   Value:        BLOOD CULTURE RECEIVED NO GROWTH TO DATE CULTURE WILL BE HELD FOR 5 DAYS BEFORE ISSUING A FINAL NEGATIVE REPORT   Report Status PENDING   Incomplete  CULTURE, BLOOD (ROUTINE X  2)     Status: None   Collection Time    09/06/12 12:20 AM      Result Value Range Status   Specimen Description BLOOD RIGHT ARM   Final   Special Requests BOTTLES DRAWN AEROBIC AND ANAEROBIC 10CC EACH   Final   Culture  Setup Time 09/06/2012 12:00   Final   Culture     Final   Value:        BLOOD CULTURE RECEIVED NO GROWTH TO DATE CULTURE WILL BE HELD FOR 5 DAYS BEFORE ISSUING A FINAL NEGATIVE REPORT   Report Status PENDING   Incomplete    Medical History: Past Medical History  Diagnosis Date  . MS (multiple sclerosis)     a. Dx'd late 20's. b. Tx with Novantrone, Tysabri, Copaxone previously.  Marland Kitchen Hypertension   . Depression   . Glaucoma(365)   . Cardiomyopathy     a.  Echo 04/29/12: Mild LVH, EF 20-25%, mild AI, moderate MR, moderate LAE, mild RAE, mild RVE, moderate TR, PASP 51, small pericardial effusion;   b. probably non-ischemic given multiple chemo-Tx agents used for MS and global LV dysfn on echo  . Chronic systolic heart failure   . CHF (congestive heart failure)   . Shortness of breath   . Neuromuscular  disorder     MS    Medications:  Anti-infectives   Start     Dose/Rate Route Frequency Ordered Stop   09/10/12 1400  vancomycin (VANCOCIN) IVPB 1000 mg/200 mL premix  Status:  Discontinued     1,000 mg 200 mL/hr over 60 Minutes Intravenous Every 8 hours 09/10/12 0307 09/10/12 0308   09/10/12 1200  vancomycin (VANCOCIN) 1,250 mg in sodium chloride 0.9 % 250 mL IVPB     1,250 mg 166.7 mL/hr over 90 Minutes Intravenous Every 12 hours 09/10/12 0309     09/10/12 0315  vancomycin (VANCOCIN) IVPB 1000 mg/200 mL premix     1,000 mg 200 mL/hr over 60 Minutes Intravenous  Once 09/10/12 0306       Assessment: Patient with cellulitis right upper extremity.    Goal of Therapy:  Vancomycin trough level 10-15 mcg/ml  Plan:  Measure antibiotic drug levels at steady state Follow up culture results Vancomycin 1gm x1, then 1250mg  iv q12hr  Aleene Davidson  [place] 09/10/2012,3:08 AM

## 2012-09-10 NOTE — Progress Notes (Signed)
CSW received notification from ED CSW that pt interested in rehab at SNF at discharge.  CSW completed FL2 and initiated SNF search to [facility].  CSW followed up with pt at bedside and provided SNF bed offers.  Pt chooses bed at [facility].  CSW notified facility of acceptance of bed offer.   CSW to continue to follow and assist with pt discharge to SNF when pt medically stable for discharge.  Jacklynn Lewis, MSW, LCSWA  Clinical Social Work [phone]

## 2012-09-10 NOTE — Progress Notes (Signed)
[facility]                                                                                Patient Demographics  Shannon Garrett, is a 52 y.o. female, DOB - [birth year], ZOX:[id number], WJX:[id number]  Admit date - 09/09/2012  Admitting Physician Standley Brooking, MD  Outpatient Primary MD for the patient is Marletta Lor, NP  LOS - 1   Chief Complaint  Patient presents with  . Fatigue        Assessment & Plan    Summary  52 year old woman just discharged one day prior presented with history of fall at home. Initial workup notable for elevated CK, leukocytosis, suspected developing cellulitis right upper extremity.   Patient with history of multiple sclerosis and recently hospitalized for fever of unclear etiology treated with antibiotics and discharged on Levaquin as well as a fall. Skilled nursing facility was recommended the patient declined. She was discharged 3/10 and on home. At home she fell in her bedroom near her bed landing on a trapeze bar that extends under the bed. She was unable to get up and laid there all night. This morning her aide came in and the patient was subsequently transported to the emergency department. She complains of pain under her left breast where she was lying on the trapeze railing. No pain in her right arm. No new complaints otherwise. She would like to go to a skilled nursing facility.     1. Fall at home: Secondary to acute weakness from multiple sclerosis. Will need skilled nursing facility. We'll place PT and social work consult.    2. Elevated CK: Her renal function currently preserved. Continue Gentle IV fluids. Recheck CK in the morning.    3. Suspected right upper extremity cellulitis: IV antibiotics. Mark border. Stable for now.    4. Wound under left breast with associated chest wall pain: Local wound care. Stable rib films.   5. Anemia: Anemia of chronic disease. Stable. Outpatient followup and  monitor.    6. Multiple sclerosis- right side more weak than the left on Baseline, outpatient followup with PCP and neurologist upon discharge. PT here.    7. Nonischemic cardiomyopathy: Appears compensated. Continue Coreg, digoxin and Aldactone, . Echo 04/29/12: Mild LVH, EF 20-25%, mild AI, moderate MR, moderate LAE, mild RAE, mild RVE, moderate TR, PASP 51, small pericardial effusion; b. probably non-ischemic given multiple chemo-Tx agents used for MS and global LV dysfn on echo. Outpatient followup with primary cardiologist upon discharge.    8. Possible pituitary adenoma discovered last admission: Followup as an outpatient.     Code Status: Full  Family Communication: Discussed with the patient no family around  Disposition Plan: TBD   Procedures     Consults      DVT Prophylaxis  Lovenox    Lab Results  Component Value Date   PLT 349 09/10/2012    Medications  Scheduled Meds: . amantadine  100 mg Oral BID  . carvedilol  6.25 mg Oral BID WC  . darifenacin  7.5 mg Oral Daily  . digoxin  0.25 mg Oral Daily  . enoxaparin (LOVENOX) injection  40 mg Subcutaneous Q24H  .  gabapentin  600 mg Oral TID  . imipramine  100 mg Oral QHS  . potassium chloride SA  40 mEq Oral Daily  . sodium chloride  3 mL Intravenous Q12H  . temazepam  15 mg Oral QHS  . Travoprost (BAK Free)  1 drop Both Eyes QHS  . vancomycin  1,250 mg Intravenous Q12H   Continuous Infusions:  PRN Meds:.sodium chloride, acetaminophen, HYDROcodone-acetaminophen, morphine injection, ondansetron (ZOFRAN) IV, ondansetron, sodium chloride  Antibiotics    Anti-infectives   Start     Dose/Rate Route Frequency Ordered Stop   09/10/12 1400  vancomycin (VANCOCIN) IVPB 1000 mg/200 mL premix  Status:  Discontinued     1,000 mg 200 mL/hr over 60 Minutes Intravenous Every 8 hours 09/10/12 0307 09/10/12 0308   09/10/12 1200  vancomycin (VANCOCIN) 1,250 mg in sodium chloride 0.9 % 250 mL IVPB     1,250 mg 166.7  mL/hr over 90 Minutes Intravenous Every 12 hours 09/10/12 0309     09/10/12 0315  vancomycin (VANCOCIN) IVPB 1000 mg/200 mL premix     1,000 mg 200 mL/hr over 60 Minutes Intravenous  Once 09/10/12 0306 09/10/12 0527       Time Spent in minutes   35   SINGH,PRASHANT K M.D on 09/10/2012 at 12:23 PM  Between 7am to 7pm - Pager - [phone]  After 7pm go to [email or web address] - password [email or web address]  And look for the night coverage person covering for me after hours  [facility] Office  [phone]    Subjective:   Shannon Garrett today has, No headache, No chest pain, No abdominal pain - No Nausea, No new weakness tingling or numbness, No Cough - SOB.    Objective:   Filed Vitals:   09/09/12 1519 09/09/12 1838 09/09/12 2110 09/10/12 0601  BP: 129/70 113/96 111/77 113/50  Pulse: 96 95 83 90  Temp: 99.2 F (37.3 C) 99.7 F (37.6 C) 98.3 F (36.8 C) 98.6 F (37 C)  TempSrc: Oral Rectal  Oral  Resp: 16 11 15 16   Height:   5\' 5"  (1.651 m)   Weight:   92.1 kg (203 lb 0.7 oz)   SpO2: 100% 100% 99% 100%    Wt Readings from Last 3 Encounters:  09/09/12 92.1 kg (203 lb 0.7 oz)  09/08/12 97.3 kg (214 lb 8.1 oz)  08/27/12 93.101 kg (205 lb 4 oz)     Intake/Output Summary (Last 24 hours) at 09/10/12 1223 Last data filed at 09/10/12 1206  Gross per 24 hour  Intake      0 ml  Output   1000 ml  Net  -1000 ml    Exam Awake Alert, Oriented X 3, No new F.N deficits, Normal affect, chronic diffuse weakness right side worse than the left. [place].AT,PERRAL Supple Neck,No JVD, No cervical lymphadenopathy appriciated.  Symmetrical Chest wall movement, Good air movement bilaterally, CTAB RRR,No Gallops,Rubs or new Murmurs, No Parasternal Heave +ve B.Sounds, Abd Soft, Non tender, No organomegaly appriciated, No rebound - guarding or rigidity. No Cyanosis, Clubbing or edema, No new Rash or bruise  Rt arm redness stable, border noted   Data Review   Micro Results Recent Results  (from the past 240 hour(s))  URINE CULTURE     Status: None   Collection Time    09/05/12 12:35 PM      Result Value Range Status   Specimen Description URINE, CATHETERIZED   Final   Special Requests NONE   Final   Culture  Setup  Time 09/05/2012 13:14   Final   Colony Count NO GROWTH   Final   Culture NO GROWTH   Final   Report Status 09/06/2012 FINAL   Final  CULTURE, BLOOD (ROUTINE X 2)     Status: None   Collection Time    09/06/12 12:10 AM      Result Value Range Status   Specimen Description BLOOD LEFT HAND   Final   Special Requests BOTTLES DRAWN AEROBIC AND ANAEROBIC 10CC EACH   Final   Culture  Setup Time 09/06/2012 12:00   Final   Culture     Final   Value:        BLOOD CULTURE RECEIVED NO GROWTH TO DATE CULTURE WILL BE HELD FOR 5 DAYS BEFORE ISSUING A FINAL NEGATIVE REPORT   Report Status PENDING   Incomplete  CULTURE, BLOOD (ROUTINE X 2)     Status: None   Collection Time    09/06/12 12:20 AM      Result Value Range Status   Specimen Description BLOOD RIGHT ARM   Final   Special Requests BOTTLES DRAWN AEROBIC AND ANAEROBIC 10CC EACH   Final   Culture  Setup Time 09/06/2012 12:00   Final   Culture     Final   Value:        BLOOD CULTURE RECEIVED NO GROWTH TO DATE CULTURE WILL BE HELD FOR 5 DAYS BEFORE ISSUING A FINAL NEGATIVE REPORT   Report Status PENDING   Incomplete    Radiology Reports    Dg Ribs Unilateral Left  09/10/2012  *RADIOLOGY REPORT*  Clinical Data: Left rib pain secondary to recent fall.  LEFT RIBS - 2 VIEW  Comparison: Chest x-rays dated 09/06/2012 and 06/06/2012  Findings: There are no definite left rib fractures.  There is minimal cortical irregularity of the anterior aspect of the left fourth rib and at the anterior aspect of the left eighth rib without a discrete fracture line.  No pneumothorax or lung contusion or pleural effusion.  IMPRESSION: No definitive rib fractures.  Slight cortical irregularity of the anterior aspects of the left 4th and 8th  ribs.   Original Report Authenticated By: Francene Boyers, M.D.        [facility]  Recent Labs Lab 09/04/12 1217  09/06/12 0530 09/07/12 0530 09/08/12 1317 09/09/12 1721 09/10/12 0416  WBC 10.6*  < > 15.8* 14.3* 14.4* 18.7* 16.0*  HGB 13.6  < > 11.7* 9.7* 10.3* 10.8* 9.7*  HCT 37.3  < > 33.2* 26.7* 27.9* 30.8* 27.6*  PLT 241  < > 231 207 258 376 349  MCV 80.2  < > 82.8 80.4 80.6 83.9 84.4  MCH 29.2  < > 29.2 29.2 29.8 29.4 29.7  MCHC 36.5*  < > 35.2 36.3* 36.9* 35.1 35.1  RDW 16.2*  < > 15.6* 15.6* 15.2 14.6 14.5  LYMPHSABS 0.6*  --   --   --   --   --   --   MONOABS 0.8  --   --   --   --   --   --   EOSABS 0.0  --   --   --   --   --   --   BASOSABS 0.0  --   --   --   --   --   --   < > = values in this interval not displayed.  Chemistries   Recent Labs Lab 09/04/12 1217  09/05/12 0700 09/06/12 0530 09/07/12 0530 09/08/12  1317 09/09/12 1721 09/10/12 0416  NA 144  --  143 136 135 136 138 135  K 3.3*  --  3.3* 3.8 3.5 3.6 3.7 3.4*  CL 98  --  102 97 98 97 96 97  CO2 36*  --  30 32 30 30 31 30   GLUCOSE 122*  --  151* 123* 105* 105* 105* 101*  BUN 13  --  11 12 11 11 18 15   CREATININE 0.81  < > 0.67 0.80 0.79 0.73 0.70 0.66  CALCIUM 9.7  --  9.1 8.9 8.5 8.8 9.5 8.6  AST 53*  --  82*  --   --   --   --   --   ALT 100*  --  81*  --   --   --   --   --   ALKPHOS 111  --  95  --   --   --   --   --   BILITOT 0.4  --  0.8  --   --   --   --   --   < > = values in this interval not displayed. ------------------------------------------------------------------------------------------------------------------ estimated creatinine clearance is 95.3 ml/min (by C-G formula based on Cr of 0.66). ------------------------------------------------------------------------------------------------------------------ No results found for this basename: HGBA1C,  in the last 72  hours ------------------------------------------------------------------------------------------------------------------ No results found for this basename: CHOL, HDL, LDLCALC, TRIG, CHOLHDL, LDLDIRECT,  in the last 72 hours ------------------------------------------------------------------------------------------------------------------ No results found for this basename: TSH, T4TOTAL, FREET3, T3FREE, THYROIDAB,  in the last 72 hours ------------------------------------------------------------------------------------------------------------------ No results found for this basename: VITAMINB12, FOLATE, FERRITIN, TIBC, IRON, RETICCTPCT,  in the last 72 hours  Coagulation profile No results found for this basename: INR, PROTIME,  in the last 168 hours  No results found for this basename: DDIMER,  in the last 72 hours  Cardiac Enzymes  Recent Labs Lab 09/04/12 1715 09/04/12 2149 09/05/12 0700  TROPONINI <0.30 <0.30 <0.30   ------------------------------------------------------------------------------------------------------------------ No components found with this basename: POCBNP,

## 2012-09-11 DIAGNOSIS — J9600 Acute respiratory failure, unspecified whether with hypoxia or hypercapnia: Secondary | ICD-10-CM

## 2012-09-11 DIAGNOSIS — L0291 Cutaneous abscess, unspecified: Secondary | ICD-10-CM

## 2012-09-11 LAB — CBC
HCT: 26.9 % — ABNORMAL LOW (ref 36.0–46.0)
Hemoglobin: 9.2 g/dL — ABNORMAL LOW (ref 12.0–15.0)
MCV: 84.6 fL (ref 78.0–100.0)
Platelets: 318 10*3/uL (ref 150–400)
RBC: 3.18 MIL/uL — ABNORMAL LOW (ref 3.87–5.11)
WBC: 10.9 10*3/uL — ABNORMAL HIGH (ref 4.0–10.5)

## 2012-09-11 LAB — BASIC METABOLIC PANEL
CO2: 26 mEq/L (ref 19–32)
Chloride: 102 mEq/L (ref 96–112)
GFR calc non Af Amer: >90 mL/min (ref >90)
Glucose, Bld: 89 mg/dL (ref 70–99)
Potassium: 4 mEq/L (ref 3.5–5.1)
Sodium: 136 mEq/L (ref 135–145)

## 2012-09-11 MED ORDER — SODIUM CHLORIDE 0.9 % IV SOLN: 250.0000 mL | INTRAVENOUS | Status: DC | PRN

## 2012-09-11 MED ORDER — TEMAZEPAM 15 MG PO CAPS: 15.0000 mg | ORAL_CAPSULE | Freq: Every evening | ORAL | Status: DC | PRN

## 2012-09-11 MED ORDER — VITAMINS A & D EX OINT
TOPICAL_OINTMENT | CUTANEOUS | Status: AC
  Administered 2012-09-11: 06:00:00
  Filled 2012-09-11: qty 5

## 2012-09-11 MED ORDER — DOXYCYCLINE HYCLATE 50 MG PO CAPS: 50.0000 mg | ORAL_CAPSULE | Freq: Two times a day (BID) | ORAL | Status: DC

## 2012-09-11 NOTE — Progress Notes (Signed)
Pt for discharge to [facility].   CSW faxed pt discharge information via [facility], discussed with pt at bedside, provided RN phone number report, and arranged ambulance transportation for pt to [facility].   No further social work needs identified at this time.  CSW signing off.   Jacklynn Lewis, MSW, LCSWA  Clinical Social Work [phone]

## 2012-09-11 NOTE — Evaluation (Signed)
Physical Therapy Evaluation Patient Details Name: Shannon Garrett MRN: [id number] DOB: 02-04-1961 Today's Date: 09/11/2012 Time: 9629-5284 PT Time Calculation (min): 55 min  PT Assessment / Plan / Recommendation Clinical Impression  Pt. is 52 yo female with h/o MS who was admitted  to hospital 1 day after DC due to fall and inability to care for self. Pt. now amenable to SNF. Continue PT while in acute care for strengthening and functional mobility. Bilateral LE weakness profound with inabiltiy to stand with use of equipment.    PT Assessment  Patient needs continued PT services    Follow Up Recommendations  SNF    Does the patient have the potential to tolerate intense rehabilitation      Barriers to Discharge Decreased caregiver support      Equipment Recommendations  None recommended by PT    Recommendations for Other Services     Frequency Min 3X/week    Precautions / Restrictions Precautions Precautions: Fall   Pertinent Vitals/Pain      Mobility  Bed Mobility Bed Mobility: Supine to Sit Supine to Sit: 4: Min assist;HOB flat;With rails Details for Bed Mobility Assistance: able to use bar to assist wtih rolling, pt was able to fet RLE over edge and used Sheet to slide LLE to edge. pt was able to push self into upright. extra time required for activity Transfers Transfers: Sit to Stand;Stand to Sit Sit to Stand: 1: +2 Total assist;From bed;From chair/3-in-1 Sit to Stand: Patient Percentage: 30% Stand to Sit: 1: +2 Total assist;To chair/3-in-1;To bed Transfer via Lift Equipment: Stedy Details for Transfer Assistance: pt requires lifting assistance for all standing, esp. from low surfaces. Pt progressively improved in standing more upright while knees were supported on stedy crossbar, O/W knees would buckle. Pt. stood  for practice4 trials. Ambulation/Gait Ambulation/Gait Assistance: Not tested (comment)    Exercises     PT Diagnosis: Generalized weakness  PT  Problem List: Decreased strength;Decreased activity tolerance;Decreased mobility;Decreased balance;Pain;Obesity;Decreased cognition PT Treatment Interventions: Therapeutic activities;Functional mobility training;Balance training;Therapeutic exercise;Patient/family education   PT Goals Acute Rehab PT Goals PT Goal Formulation: With patient Time For Goal Achievement: 09/25/12 Potential to Achieve Goals: Fair Pt will go Supine/Side to Sit: with modified independence PT Goal: Supine/Side to Sit - Progress: Goal set today Pt will go Sit to Supine/Side: with modified independence PT Goal: Sit to Supine/Side - Progress: Goal set today Pt will go Sit to Stand: with +2 total assist;with upper extremity assist PT Goal: Sit to Stand - Progress: Goal set today Pt will go Stand to Sit: with min assist PT Goal: Stand to Sit - Progress: Goal set today Pt will Stand: with +2 total assist;1 - 2 min;with bilateral upper extremity support PT Goal: Stand - Progress: Goal set today Pt will Propel Wheelchair: 51 - 150 feet;with supervision PT Goal: Propel Wheelchair - Progress: Goal set today Additional Goals Additional Goal #1: transfer bed to/fro WC with sliding board + 2 assist-pt = 50% PT Goal: Additional Goal #1 - Progress: Goal set today  Visit Information  Last PT Received On: 09/11/12 Assistance Needed: +2    Subjective Data  Subjective: If I could get up and walk to the trash can and to the bathroom i think I can go home. Patient Stated Goal: to go home.   Prior Functioning  Home Living Lives With: Alone Available Help at Discharge: Personal care attendant (8 hours/ day) Type of Home: House Home Access: Level entry Home Layout: One level Bathroom Shower/Tub: Tub/shower  unit Bathroom Toilet: Handicapped height Home Adaptive Equipment: Walker - rolling;Wheelchair - powered;Bedside commode/3-in-1;Tub transfer bench Prior Function Level of Independence: Needs assistance Needs Assistance:  Bathing;Dressing;Toileting;Meal Prep;Light Housekeeping;Transfers Bath: Moderate Dressing: Moderate Toileting: Moderate Meal Prep: Total Light Housekeeping: Total Transfer Assistance: total since last admit in past week. Comments: pt was DC to home 3/10 and fell and returned to  ed with inability to care for self. Communication Communication: No difficulties    Cognition  Cognition Overall Cognitive Status: Impaired Area of Impairment: Safety/judgement;Awareness of deficits Arousal/Alertness: Awake/alert Orientation Level: Appears intact for tasks assessed Behavior During Session: [facility] for tasks performed Safety/Judgement: Decreased awareness of safety precautions;Decreased safety judgement for tasks assessed;Decreased awareness of need for assistance Cognition - Other Comments: decreased problem solving and impaired awareness of deficits    Extremity/Trunk Assessment Right Upper Extremity Assessment RUE ROM/Strength/Tone: Deficits RUE ROM/Strength/Tone Deficits: limitedAROM shoulder. unable to complete any shoulder movement. elbow WFL for ROM 3+/5 strength. Hand 3/5. poor in hand manipulation skills RUE Sensation: Deficits RUE Coordination Deficits: diffiuclty holding objects. frequent drops Left Upper Extremity Assessment LUE ROM/Strength/Tone: Deficits LUE ROM/Strength/Tone Deficits: same as R Right Lower Extremity Assessment RLE ROM/Strength/Tone: Deficits RLE ROM/Strength/Tone Deficits: grossly 2/5 for hip flex, knee ext., trace abd/add. dorsiflexion trace. RLE Sensation: Deficits RLE Sensation Deficits: impaired LT Left Lower Extremity Assessment LLE ROM/Strength/Tone: Deficits LLE ROM/Strength/Tone Deficits: trace AROM of 1/5 for hip ABD/ADD, 0/5 ankle DF, 0/5 knee -pt reports pain with PROM, esp at knee LLE Sensation: Deficits LLE Sensation Deficits: impaired LT Trunk Assessment Trunk Assessment: Other exceptions;Normal   Balance Balance Balance Assessed: Yes Static  Sitting Balance Static Sitting - Balance Support: No upper extremity supported;Feet supported Static Sitting - Level of Assistance: 5: Stand by assistance Static Sitting - Comment/# of Minutes: pt sat x 10 minutes on bed and 5 minutes on BSC with no assistance/ only supervision. Dynamic Sitting Balance Dynamic Sitting - Balance Support: During functional activity Dynamic Sitting - Level of Assistance: 3: Mod assist  End of Session PT - End of Session Activity Tolerance: Patient tolerated treatment well Patient left: in bed;with call bell/phone within reach;with bed alarm set Nurse Communication: Mobility status;Need for lift equipment  GP     Rada Hay 09/11/2012, 11:31 AM  [phone]

## 2012-09-11 NOTE — Discharge Summary (Signed)
[facility]                                                                                   Shannon Garrett, is a 52 y.o. female  DOB Aug 30, 1960  MRN [id number].  Admission date:  09/09/2012  Discharge Date:  09/11/2012  Primary MD  Marletta Lor, NP  Admitting Physician  Standley Brooking, MD  Admission Diagnosis  Dehydration [276.51] Cellulitis [682.9] Fall, initial encounter [E888.9]  Discharge Diagnosis     Principal Problem:   Fall Active Problems:   ANEMIA, CHRONIC   MULTIPLE SCLEROSIS, PROGRESSIVE/RELAPSING   Chronic systolic heart failure   Elevated CK   Cellulitis of right upper extremity    Past Medical History  Diagnosis Date  . MS (multiple sclerosis)     a. Dx'd late 20's. b. Tx with Novantrone, Tysabri, Copaxone previously.  Marland Kitchen Hypertension   . Depression   . Glaucoma(365)   . Cardiomyopathy     a.  Echo 04/29/12: Mild LVH, EF 20-25%, mild AI, moderate MR, moderate LAE, mild RAE, mild RVE, moderate TR, PASP 51, small pericardial effusion;   b. probably non-ischemic given multiple chemo-Tx agents used for MS and global LV dysfn on echo  . Chronic systolic heart failure   . CHF (congestive heart failure)   . Shortness of breath   . Neuromuscular disorder     MS    Past Surgical History  Procedure Laterality Date  . Ablation      uterine  . Cesarean section       Recommendations for primary care physician for things to follow:       Discharge Diagnoses:   Principal Problem:   Fall Active Problems:   ANEMIA, CHRONIC   MULTIPLE SCLEROSIS, PROGRESSIVE/RELAPSING   Chronic systolic heart failure   Elevated CK   Cellulitis of right upper extremity    Discharge Condition: Stable   Diet recommendation: See Discharge Instructions below   Consults      History of present illness and  Hospital Course:     Kindly see H&P for history of present illness and admission details, please review complete Labs, Consult reports and  Test reports for all details in brief Shannon Garrett, is a 52 y.o. female, patient he of multiple sclerosis and generalized weakness right side worse than the left, who was recently hospitalized to [facility] for falls and fever of unclear etiology, presented again to [facility] with reoccurrence of fall, low-grade fevers and leukocytosis, his time the source was identified to be right arm cellulitis, patient also had mildly elevated CK due to her falls, she was treated with IV vancomycin and IV fluids, she was seen by PT, her right upper with the cellulitis is clinically and physically much improved, she will be transitioned to oral doxycycline as she is allergic to sulfa, will request M.D. at nursing home to keep a close eye on her right upper extremity, if cellulitis starts to get worse please transition her to Zyvox from doxycycline. Antibiotic should be given for 1 more week.  With IV fluids with elevated CKs are trending down nicely, from 5000 there down to 1000 range,  creatinine is stable, at this time oral hydration is recommended.  Patient due to her fall had left-sided chest wall pain, x-rays did not reveal any rib fractures she has just soft tissue injury.   Patient also has history of nonischemic cardiomyopathy with chronic systolic CHF with EF between 20-25%, she has no acute issues this admission from this standpoint, she will continue to follow with her cardiologist Dr. Excell Seltzer post discharge. 1.8 L total daily fluid restriction.   During her last admission at [facility] CT scans of the brain suggestive of questionable pituitary adenoma. We'll request one time neurology followup post discharge she can either follow with her primary neurologist or neurologist recommended by me in discharge instructions below.  For her underlying mass she should follow with her neurologist all of our recommended neurologist one time.    Today   Subjective:   Shannon Garrett today has no  headache,no chest abdominal pain,no new weakness tingling or numbness, feels much better.  Objective:   Blood pressure 120/50, pulse 84, temperature 97.7 F (36.5 C), temperature source Oral, resp. rate 16, height 5\' 5"  (1.651 m), weight 92.1 kg (203 lb 0.7 oz), SpO2 100.00%.   Intake/Output Summary (Last 24 hours) at 09/11/12 1210 Last data filed at 09/11/12 0636  Gross per 24 hour  Intake    930 ml  Output   1300 ml  Net   -370 ml    Exam Awake Alert, Oriented *3, No new F.N deficits, Normal affect [place].AT,PERRAL Supple Neck,No JVD, No cervical lymphadenopathy appriciated.  Symmetrical Chest wall movement, Good air movement bilaterally, CTAB RRR,No Gallops,Rubs or new Murmurs, No Parasternal Heave +ve B.Sounds, Abd Soft, Non tender, No organomegaly appriciated, No rebound -guarding or rigidity. No Cyanosis, Clubbing or edema, No new Rash or bruise, R arm cellulitis almost resolved  Data Review   Major procedures and Radiology Reports - PLEASE review detailed and final reports for all details in brief - none     Micro Results      Recent Results (from the past 240 hour(s))  URINE CULTURE     Status: None   Collection Time    09/05/12 12:35 PM      Result Value Range Status   Specimen Description URINE, CATHETERIZED   Final   Special Requests NONE   Final   Culture  Setup Time 09/05/2012 13:14   Final   Colony Count NO GROWTH   Final   Culture NO GROWTH   Final   Report Status 09/06/2012 FINAL   Final  CULTURE, BLOOD (ROUTINE X 2)     Status: None   Collection Time    09/06/12 12:10 AM      Result Value Range Status   Specimen Description BLOOD LEFT HAND   Final   Special Requests BOTTLES DRAWN AEROBIC AND ANAEROBIC 10CC EACH   Final   Culture  Setup Time 09/06/2012 12:00   Final   Culture     Final   Value:        BLOOD CULTURE RECEIVED NO GROWTH TO DATE CULTURE WILL BE HELD FOR 5 DAYS BEFORE ISSUING A FINAL NEGATIVE REPORT   Report Status PENDING   Incomplete   CULTURE, BLOOD (ROUTINE X 2)     Status: None   Collection Time    09/06/12 12:20 AM      Result Value Range Status   Specimen Description BLOOD RIGHT ARM   Final   Special Requests BOTTLES DRAWN AEROBIC AND ANAEROBIC 10CC  EACH   Final   Culture  Setup Time 09/06/2012 12:00   Final   Culture     Final   Value:        BLOOD CULTURE RECEIVED NO GROWTH TO DATE CULTURE WILL BE HELD FOR 5 DAYS BEFORE ISSUING A FINAL NEGATIVE REPORT   Report Status PENDING   Incomplete     CBC w Diff: Lab Results  Component Value Date   WBC 10.9* 09/11/2012   HGB 9.2* 09/11/2012   HCT 26.9* 09/11/2012   PLT 318 09/11/2012   LYMPHOPCT 6* 09/04/2012   MONOPCT 7 09/04/2012   EOSPCT 0 09/04/2012   BASOPCT 0 09/04/2012    CMP: Lab Results  Component Value Date   NA 136 09/11/2012   K 4.0 09/11/2012   CL 102 09/11/2012   CO2 26 09/11/2012   BUN 14 09/11/2012   CREATININE 0.63 09/11/2012   PROT 6.4 09/05/2012   ALBUMIN 2.8* 09/05/2012   BILITOT 0.8 09/05/2012   ALKPHOS 95 09/05/2012   AST 82* 09/05/2012   ALT 81* 09/05/2012  .   Discharge Instructions     Follow with Primary MD Marletta Lor, NP in 4 days   Get CBC, CMP, checked 4 days by Primary MD and again as instructed by your Primary MD.   Get Medicines reviewed and adjusted.  Please request your Prim.MD to go over all Hospital Tests and Procedure/Radiological results at the follow up, please get all Hospital records sent to your Prim MD by signing hospital release before you go home.  Activity: As tolerated with Full fall precautions use walker/cane & assistance as needed   Diet:  Heart Healthy  For Heart failure patients - Check your Weight same time everyday, if you gain over 2 pounds, or you develop in leg swelling, experience more shortness of breath or chest pain, call your Primary MD immediately. Follow Cardiac Low Salt Diet and 1.8 lit/day fluid restriction.  Disposition SNF  If you experience worsening of your admission symptoms, develop shortness of  breath, life threatening emergency, suicidal or homicidal thoughts you must seek medical attention immediately by calling 911 or calling your MD immediately  if symptoms less severe.  You Must read complete instructions/literature along with all the possible adverse reactions/side effects for all the Medicines you take and that have been prescribed to you. Take any new Medicines after you have completely understood and accpet all the possible adverse reactions/side effects.   Do not drive and provide baby sitting services if your were admitted for syncope or siezures until you have seen by Primary MD or a Neurologist and advised to do so again.  Do not drive when taking Pain medications.    Do not take more than prescribed Pain, Sleep and Anxiety Medications  Special Instructions: If you have smoked or chewed Tobacco  in the last 2 yrs please stop smoking, stop any regular Alcohol  and or any Recreational drug use.  Wear Seat belts while driving.  Follow-up Information   Follow up with Marletta Lor, NP. Schedule an appointment as soon as possible for a visit in 3 days. (Also follow with her primary neurologist)    Contact information:   [address] [place] [address] [phone]       Follow up with Tonny Bollman, MD. Schedule an appointment as soon as possible for a visit in 1 week.   Contact information:   [address] [place] [address] [phone]  Follow up with Gates Rigg, MD. Schedule an appointment as soon as possible for a visit in 2 weeks. (for your multiple sclerosis and possible pituitary adenoma)    Contact information:   [address] [facility] [place] [address] [phone]         Discharge Medications     Medication List    STOP taking these medications       levofloxacin 750 MG tablet  Commonly known as:  LEVAQUIN     predniSONE 20 MG tablet  Commonly known as:   DELTASONE      TAKE these medications       amantadine 100 MG capsule  Commonly known as:  SYMMETREL  Take 100 mg by mouth 2 (two) times daily.     carvedilol 6.25 MG tablet  Commonly known as:  COREG  Take 1 tablet (6.25 mg total) by mouth 2 (two) times daily with a meal.     digoxin 0.25 MG tablet  Commonly known as:  LANOXIN  Take 1 tablet (0.25 mg total) by mouth daily.     doxycycline 50 MG capsule  Commonly known as:  VIBRAMYCIN  Take 1 capsule (50 mg total) by mouth 2 (two) times daily. 1 more week     furosemide 40 MG tablet  Commonly known as:  LASIX  Take 1 tablet (40 mg total) by mouth daily.     gabapentin 600 MG tablet  Commonly known as:  NEURONTIN  Take 600 mg by mouth 3 (three) times daily.     imipramine 50 MG tablet  Commonly known as:  TOFRANIL  Take 100 mg by mouth at bedtime.     nitrofurantoin 100 MG capsule  Commonly known as:  MACRODANTIN  Take 100 mg by mouth daily.     potassium chloride SA 20 MEQ tablet  Commonly known as:  K-DUR,KLOR-CON  Take 40 mEq by mouth daily.     solifenacin 10 MG tablet  Commonly known as:  VESICARE  Take 10 mg by mouth 2 (two) times daily.     spironolactone 25 MG tablet  Commonly known as:  ALDACTONE  Take 25 mg by mouth every morning.     temazepam 15 MG capsule  Commonly known as:  RESTORIL  Take 15 mg by mouth at bedtime.     Travoprost (BAK Free) 0.004 % Soln ophthalmic solution  Commonly known as:  TRAVATAN  Place 1 drop into both eyes at bedtime.           Total Time in preparing paper work, data evaluation and todays exam - 35 minutes  Leroy Sea M.D on 09/11/2012 at 12:10 PM  [facility] Office  [phone]

## 2012-09-11 NOTE — Progress Notes (Signed)
At 1445 attempted to report off to nurse at [facility] Left phone number with secretary for nurse to call me.

## 2012-09-11 NOTE — Progress Notes (Addendum)
Pt received discharge education and had no further questions. Pt was stable at time of discharge. Pt's IV was removed. Attempted to call nurse at [facility], left my phone number, and did not receive a response to report. Pt had an enema scheduled here, but did not want pt to worry about having a bowel movement mid-transfer. Received permission from Dr. Thedore Mins to hold off.

## 2012-09-12 ENCOUNTER — Encounter: Payer: Self-pay | Admitting: Cardiovascular Disease

## 2012-09-12 LAB — CULTURE, BLOOD (ROUTINE X 2): Culture: NO GROWTH

## 2012-09-18 ENCOUNTER — Ambulatory Visit (HOSPITAL_COMMUNITY)
Admission: RE | Admit: 2012-09-18 | Discharge: 2012-09-18 | Disposition: A | Discharge status: Home / Self Care | Payer: PRIVATE HEALTH INSURANCE | Source: Ambulatory Visit | Attending: Internal Medicine | Admitting: Internal Medicine

## 2012-09-18 DIAGNOSIS — E669 Obesity, unspecified: Secondary | ICD-10-CM | POA: Insufficient documentation

## 2012-09-18 DIAGNOSIS — I5022 Chronic systolic (congestive) heart failure: Secondary | ICD-10-CM

## 2012-09-18 DIAGNOSIS — I359 Nonrheumatic aortic valve disorder, unspecified: Secondary | ICD-10-CM

## 2012-09-18 DIAGNOSIS — I509 Heart failure, unspecified: Secondary | ICD-10-CM | POA: Insufficient documentation

## 2012-09-18 DIAGNOSIS — I059 Rheumatic mitral valve disease, unspecified: Secondary | ICD-10-CM | POA: Insufficient documentation

## 2012-09-18 DIAGNOSIS — I079 Rheumatic tricuspid valve disease, unspecified: Secondary | ICD-10-CM | POA: Insufficient documentation

## 2012-09-18 DIAGNOSIS — I10 Essential (primary) hypertension: Secondary | ICD-10-CM | POA: Insufficient documentation

## 2012-09-18 DIAGNOSIS — M7989 Other specified soft tissue disorders: Secondary | ICD-10-CM | POA: Insufficient documentation

## 2012-09-18 NOTE — Progress Notes (Signed)
  Echocardiogram 2D Echocardiogram has been performed.  Cathie Beams 09/18/2012, 3:18 PM

## 2012-09-22 ENCOUNTER — Ambulatory Visit (INDEPENDENT_AMBULATORY_CARE_PROVIDER_SITE_OTHER): Payer: PRIVATE HEALTH INSURANCE | Admitting: Nurse Practitioner

## 2012-09-22 ENCOUNTER — Encounter: Payer: Self-pay | Admitting: Nurse Practitioner

## 2012-09-22 VITALS — BP 110/78 | HR 68 | Ht 65.0 in

## 2012-09-22 DIAGNOSIS — R0609 Other forms of dyspnea: Secondary | ICD-10-CM

## 2012-09-22 DIAGNOSIS — R7989 Other specified abnormal findings of blood chemistry: Secondary | ICD-10-CM

## 2012-09-22 DIAGNOSIS — R0600 Dyspnea, unspecified: Secondary | ICD-10-CM

## 2012-09-22 DIAGNOSIS — D619 Aplastic anemia, unspecified: Secondary | ICD-10-CM

## 2012-09-22 DIAGNOSIS — I5022 Chronic systolic (congestive) heart failure: Secondary | ICD-10-CM

## 2012-09-22 LAB — HEPATIC FUNCTION PANEL
ALT: 25 U/L (ref 0–35)
AST: 18 U/L (ref 0–37)
Albumin: 3.2 g/dL — ABNORMAL LOW (ref 3.5–5.2)
Alkaline Phosphatase: 96 U/L (ref 39–117)
Bilirubin, Direct: 0 mg/dL (ref 0.0–0.3)
Total Bilirubin: 0.4 mg/dL (ref 0.3–1.2)
Total Protein: 6.6 g/dL (ref 6.0–8.3)

## 2012-09-22 LAB — BASIC METABOLIC PANEL
BUN: 13 mg/dL (ref 6–23)
CO2: 29 mEq/L (ref 19–32)
Calcium: 9 mg/dL (ref 8.4–10.5)
Chloride: 102 mEq/L (ref 96–112)
Creatinine, Ser: 0.9 mg/dL (ref 0.4–1.2)
GFR: 89.88 mL/min (ref >60.00)
Glucose, Bld: 89 mg/dL (ref 70–99)
Potassium: 4.3 mEq/L (ref 3.5–5.1)
Sodium: 139 mEq/L (ref 135–145)

## 2012-09-22 LAB — BRAIN NATRIURETIC PEPTIDE: Pro B Natriuretic peptide (BNP): 7 pg/mL (ref 0.0–100.0)

## 2012-09-22 LAB — CBC WITH DIFFERENTIAL/PLATELET
Basophils Absolute: 0.1 10*3/uL (ref 0.0–0.1)
Basophils Relative: 1.3 % (ref 0.0–3.0)
Eosinophils Absolute: 0.4 10*3/uL (ref 0.0–0.7)
Eosinophils Relative: 6.9 % — ABNORMAL HIGH (ref 0.0–5.0)
HCT: 30.7 % — ABNORMAL LOW (ref 36.0–46.0)
Hemoglobin: 10.3 g/dL — ABNORMAL LOW (ref 12.0–15.0)
Lymphocytes Relative: 22.5 % (ref 12.0–46.0)
Lymphs Abs: 1.1 10*3/uL (ref 0.7–4.0)
MCHC: 33.5 g/dL (ref 30.0–36.0)
MCV: 90.2 fl (ref 78.0–100.0)
Monocytes Absolute: 0.5 10*3/uL (ref 0.1–1.0)
Monocytes Relative: 9 % (ref 3.0–12.0)
Neutro Abs: 3.1 10*3/uL (ref 1.4–7.7)
Neutrophils Relative %: 60.3 % (ref 43.0–77.0)
Platelets: 458 10*3/uL — ABNORMAL HIGH (ref 150.0–400.0)
RBC: 3.41 Mil/uL — ABNORMAL LOW (ref 3.87–5.11)
RDW: 15.9 % — ABNORMAL HIGH (ref 11.5–14.6)
WBC: 5.1 10*3/uL (ref 4.5–10.5)

## 2012-09-22 NOTE — Progress Notes (Signed)
Jay Schlichter Date of Birth: [birth year] Medical Record #[id number]  History of Present Illness: Ms. Treto is seen back today for a post hospital visit. She is seen for Dr. Excell Seltzer. She has multiple medical issues - resides at [facility]. She has a hx of multiple sclerosis, HTN and depression. She is limited by her multiple sclerosis and is "scooter-bound." Echo in 6/12 demonstrated normal LVF. She has been treated with Novantrone, Tysabri, Copaxone, Symmetrel and Dimethyl Fumerate for her MS. She was admitted 04/2012 with new onset systolic CHF. CT was negative for pulmonary embolism. Findings were suggestive of pulmonary edema. Echo 04/29/12: Mild LVH, EF 20-25%, mild AI, moderate MR, moderate LAE, mild RAE, mild RVE, moderate TR, PASP 51, small pericardial effusion. 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 She has been exposed to multiple chemotherapeutic agents in the past including Novantrone which can cause CHF months to years after discontinuation. Medical therapy recommended for CHF. Ischemic evaluation was not felt to be necessary. She has been referred to the CHF clinic and has been seen there a few times this year. Apparently has had issues regarding a questionable pituitary adenoma.   She was most recently in the hospital with dehydration, cellulitis of the right arm and a subsequent fall. 2 D echo was obtained. Her EF has now normalized.   She comes in today. She is here with her aide. She is in a wheelchair. Says her breathing is ok. Feels bloated but is also constipated. Not able to weigh. Wants a suppository. Most recent labs reviewed and her echo. Patient was unaware of her echo results. EF is now normal.    Current Outpatient Prescriptions on File Prior to Visit  Medication Sig Dispense Refill  . amantadine (SYMMETREL) 100 MG capsule Take 100 mg by mouth 2 (two) times daily.       . carvedilol (COREG) 6.25 MG tablet Take 1 tablet (6.25 mg total) by mouth 2 (two)  times daily with a meal.  60 tablet  3  . digoxin (LANOXIN) 0.25 MG tablet Take 1 tablet (0.25 mg total) by mouth daily.      Marland Kitchen doxycycline (VIBRAMYCIN) 50 MG capsule Take 1 capsule (50 mg total) by mouth 2 (two) times daily. 1 more week      . furosemide (LASIX) 40 MG tablet Take 1 tablet (40 mg total) by mouth daily.  30 tablet  1  . gabapentin (NEURONTIN) 600 MG tablet Take 600 mg by mouth 3 (three) times daily.       Marland Kitchen imipramine (TOFRANIL) 50 MG tablet Take 100 mg by mouth at bedtime.        . nitrofurantoin (MACRODANTIN) 100 MG capsule Take 100 mg by mouth daily.      . potassium chloride SA (K-DUR,KLOR-CON) 20 MEQ tablet Take 40 mEq by mouth daily.   1 tablet  0  . solifenacin (VESICARE) 10 MG tablet Take 10 mg by mouth 2 (two) times daily.       Marland Kitchen spironolactone (ALDACTONE) 25 MG tablet Take 25 mg by mouth every morning.      . temazepam (RESTORIL) 15 MG capsule Take 1 capsule (15 mg total) by mouth at bedtime as needed for sleep.  10 capsule  0  . Travoprost, BAK Free, (TRAVATAN) 0.004 % SOLN ophthalmic solution Place 1 drop into both eyes at bedtime.        No current facility-administered medications on file prior to visit.    Allergies  Allergen Reactions  .  Sulfonamide Derivatives Shortness Of Breath  . Penicillins Hives and Itching    Past Medical History  Diagnosis Date  . MS (multiple sclerosis)     a. Dx'd late 20's. b. Tx with Novantrone, Tysabri, Copaxone previously.  Marland Kitchen Hypertension   . Depression   . Glaucoma(365)   . Cardiomyopathy     a.  Echo 04/29/12: Mild LVH, EF 20-25%, mild AI, moderate MR, moderate LAE, mild RAE, mild RVE, moderate TR, PASP 51, small pericardial effusion;   b. probably non-ischemic given multiple chemo-Tx agents used for MS and global LV dysfn on echo  . Chronic systolic heart failure   . CHF (congestive heart failure)   . Shortness of breath   . Neuromuscular disorder     MS    Past Surgical History  Procedure Laterality Date  .  Ablation      uterine  . Cesarean section      History  Smoking status  . Never Smoker   Smokeless tobacco  . Never Used    History  Alcohol Use No    Family History  Problem Relation Age of Onset  . Hypertension Mother     Review of Systems: The review of systems is per the HPI.  All other systems were reviewed and are negative.  Physical Exam: BP 110/78  Pulse 68  Ht 5\' 5"  (1.651 m) Patient is alert and in no acute distress.Affect is quite flat. Seems depressed to me. Skin is warm and dry. Color is normal.  HEENT is unremarkable. Normocephalic/atraumatic. PERRL. Sclera are nonicteric. Neck is supple. No masses. No JVD. Lungs are clear. Cardiac exam shows a regular rate and rhythm. Heart tones distant. Abdomen is soft. Extremities are without significant edema. Gait is not tested. ROM appears intact. No gross neurologic deficits noted. Right arm looks ok.   LABORATORY DATA: PENDING  Lab Results  Component Value Date   WBC 10.9* 09/11/2012   HGB 9.2* 09/11/2012   HCT 26.9* 09/11/2012   PLT 318 09/11/2012   GLUCOSE 89 09/11/2012   CHOL 145 05/01/2012   TRIG 72 05/01/2012   HDL 23* 05/01/2012   LDLCALC 108* 05/01/2012   ALT 81* 09/05/2012   AST 82* 09/05/2012   NA 136 09/11/2012   K 4.0 09/11/2012   CL 102 09/11/2012   CREATININE 0.63 09/11/2012   BUN 14 09/11/2012   CO2 26 09/11/2012   TSH 3.510 04/28/2012   Echo Study Conclusions from March 2014  - Left ventricle: The cavity size was normal. Wall thickness was increased in a pattern of mild LVH. Systolic function was normal. The estimated ejection fraction was in the range of 50% to 55%. Regional wall motion abnormalities cannot be excluded. Doppler parameters are consistent with abnormal left ventricular relaxation (grade 1 diastolic dysfunction). - Aortic valve: Mild regurgitation. - Mitral valve: Calcified annulus. - Left atrium: The atrium was mildly dilated.  Assessment / Plan: 1. Recent admission for  cellulitis of the right arm, falls, dehydration - this seems to have resolved.   2. Chronic systolic heart failure - EF has improved. I think her complaint of bloating is more related to constipation. I am rechecking labs today. She is to follow up in the CHF clinic as planned next week.   3. MS - this is her most debilitating issue.   4. Lab abnormality - rechecking today.   I have left her on her current regimen for now. No changes except she may use Dulcolax suppository x 1  today.   Patient is agreeable to this plan and will call if any problems develop in the interim.   Rosalio Macadamia, RN, ANP-C  [facility] [address] [place], [place]  [address]

## 2012-09-22 NOTE — Patient Instructions (Addendum)
We need to check labs today  Please give patient one time dose Dulocolax suppository  Follow up in the heart failure clinic as planned  Call the [facility] office at [phone] if you have any questions, problems or concerns.

## 2012-09-29 ENCOUNTER — Ambulatory Visit (HOSPITAL_COMMUNITY)
Admission: RE | Admit: 2012-09-29 | Discharge: 2012-09-29 | Disposition: A | Discharge status: Home / Self Care | Payer: PRIVATE HEALTH INSURANCE | Source: Ambulatory Visit | Attending: Internal Medicine | Admitting: Internal Medicine

## 2012-09-29 VITALS — BP 132/84 | HR 86 | Resp 18 | Ht 65.0 in | Wt 204.8 lb

## 2012-09-29 DIAGNOSIS — I5022 Chronic systolic (congestive) heart failure: Secondary | ICD-10-CM | POA: Insufficient documentation

## 2012-09-29 DIAGNOSIS — I509 Heart failure, unspecified: Secondary | ICD-10-CM | POA: Insufficient documentation

## 2012-09-29 DIAGNOSIS — I5032 Chronic diastolic (congestive) heart failure: Secondary | ICD-10-CM

## 2012-09-29 NOTE — Assessment & Plan Note (Addendum)
EF has recovered per last echo.  Have reviewed with patient.  Volume status looks good.  Will continue lasix daily.  Stop digoxin with recovery in EF.  Have discussed addition of lisinopril but she wants to hold off at this time.  With recovery of EF will have her follow up in the HF clinic as needed.  She will return to her primary cardiologist Dr. Excell Seltzer.  Patient seen and examined with Ulyess Blossom, PA-C. We discussed all aspects of the encounter. I agree with the assessment and plan as stated above. Main limitation now appears to be MS. EF improved. We tried to get her to start lisinopril but she refuses. She appears depressed about ongoing limitation due to MS - feels rehab not helping much. Wants to f/u with Dr. Excell Seltzer. We will arrange. Please refer back to HF Clinic if we can help.

## 2012-09-29 NOTE — Progress Notes (Signed)
Referring Physician: Tereso Newcomer, PA at [facility] Primary Care: Dr. Jeri Cos  Primary Cardiologist: Dr Tonny Bollman Neurologist: Dr. Lin Givens   Weight Range: 214 pounds  Baseline proBNP: 10618 on 04/30/12  HPI: Shannon Garrett is a 52 y.o. female with history of multiple sclerosis (diagnosed in late 82s).  She has been treated with Novantrone, Tysabri, Copaxone, Symmetrel and Dimethyl Fumerate for her MS.  She is scooter-bound with long distances but can ambulate short distances around the house.  She also has systolic heart failure, EF 25% no prior ischemic work up.  She has hypertension and depression as well.      Admitted 10/28-10/31 to [facility] for a two-week history of progressively worsening shortness of breath, LE edema, orthopnea and decreased urine output. CT was negative for pulmonary embolism. Findings were suggestive of pulmonary edema. Echo 04/29/12: Mild LVH, EF 20-25%, mild AI, moderate MR, moderate LAE, mild RAE, mild RVE, moderate TR, PASP 51, small pericardial effusion.  She was diuresed and discharged home. Discharge weight 214 pounds.   She returns for follow up today.  She feels very well.  She recently admitted to [facility] for hypotension/weakness and Rt arm cellulitis.  She was treated with abx.  Discharged to [facility].  Her weight has remained stable.  She denies orthopnea or PND.  No chest pain.  She is ready to go home.  She is working with rehab but does not feel that she is making much progress.     Review of Systems: All pertinent positives and negatives as in HPI, otherwise negative.   Past Medical History  Diagnosis Date  . MS (multiple sclerosis)     a. Dx'd late 20's. b. Tx with Novantrone, Tysabri, Copaxone previously.  Marland Kitchen Hypertension   . Depression   . Glaucoma(365)   . Cardiomyopathy     a.  Echo 04/29/12: Mild LVH, EF 20-25%, mild AI, moderate MR, moderate LAE, mild RAE, mild RVE, moderate TR, PASP 51, small pericardial effusion;   b. probably  non-ischemic given multiple chemo-Tx agents used for MS and global LV dysfn on echo  . Chronic systolic heart failure   . CHF (congestive heart failure)   . Shortness of breath   . Neuromuscular disorder     MS    Current Outpatient Prescriptions  Medication Sig Dispense Refill  . amantadine (SYMMETREL) 100 MG capsule Take 100 mg by mouth 2 (two) times daily.       . carvedilol (COREG) 6.25 MG tablet Take 1 tablet (6.25 mg total) by mouth 2 (two) times daily with a meal.  60 tablet  3  . digoxin (LANOXIN) 0.25 MG tablet Take 1 tablet (0.25 mg total) by mouth daily.      Marland Kitchen doxycycline (VIBRAMYCIN) 50 MG capsule Take 1 capsule (50 mg total) by mouth 2 (two) times daily. 1 more week      . furosemide (LASIX) 40 MG tablet Take 1 tablet (40 mg total) by mouth daily.  30 tablet  1  . gabapentin (NEURONTIN) 600 MG tablet Take 600 mg by mouth 3 (three) times daily.       Marland Kitchen imipramine (TOFRANIL) 50 MG tablet Take 100 mg by mouth at bedtime.        . nitrofurantoin (MACRODANTIN) 100 MG capsule Take 100 mg by mouth daily.      . potassium chloride SA (K-DUR,KLOR-CON) 20 MEQ tablet Take 40 mEq by mouth daily.   1 tablet  0  . solifenacin (VESICARE) 10 MG tablet  Take 10 mg by mouth 2 (two) times daily.       Marland Kitchen spironolactone (ALDACTONE) 25 MG tablet Take 25 mg by mouth every morning.      . temazepam (RESTORIL) 15 MG capsule Take 1 capsule (15 mg total) by mouth at bedtime as needed for sleep.  10 capsule  0  . Travoprost, BAK Free, (TRAVATAN) 0.004 % SOLN ophthalmic solution Place 1 drop into both eyes at bedtime.        No current facility-administered medications for this encounter.    Allergies  Allergen Reactions  . Sulfonamide Derivatives Shortness Of Breath  . Penicillins Hives and Itching     PHYSICAL EXAM: Filed Vitals:   09/29/12 1401  BP: 132/84  Pulse: 86  Resp: 18  Height: 5\' 5"  (1.651 m)  Weight: 204 lb 12.8 oz (92.897 kg)  SpO2: 97%    General:  Chronically ill   appearing. No respiratory difficulty.  In wheelchair  HEENT: normal Neck: supple. JVP 5-6. Carotids 2+ bilat; no bruits. No lymphadenopathy or thryomegaly appreciated. Cor: PMI nondisplaced. Regular rate & rhythm. Soft LVOT murmur noted. Lungs: clear Abdomen: soft, nontender, nondistended. No hepatosplenomegaly. No bruits or masses. Good bowel sounds. Extremities: no cyanosis, clubbing, rash, trace edema Neuro: alert & oriented x 3, cranial nerves grossly intact. moves all 4 extremities w/o difficulty. Affect pleasant.   ASSESSMENT & PLAN:

## 2012-09-30 ENCOUNTER — Ambulatory Visit: Payer: Self-pay | Admitting: Neurology

## 2012-10-11 ENCOUNTER — Emergency Department (HOSPITAL_COMMUNITY)
Admission: EM | Admit: 2012-10-11 | Discharge: 2012-10-12 | Disposition: A | Discharge status: Home / Self Care | Payer: Medicare Other | Attending: Emergency Medicine | Admitting: Emergency Medicine

## 2012-10-11 DIAGNOSIS — I10 Essential (primary) hypertension: Secondary | ICD-10-CM | POA: Insufficient documentation

## 2012-10-11 DIAGNOSIS — Z79899 Other long term (current) drug therapy: Secondary | ICD-10-CM | POA: Insufficient documentation

## 2012-10-11 DIAGNOSIS — R5381 Other malaise: Secondary | ICD-10-CM | POA: Insufficient documentation

## 2012-10-11 DIAGNOSIS — Z8679 Personal history of other diseases of the circulatory system: Secondary | ICD-10-CM | POA: Insufficient documentation

## 2012-10-11 DIAGNOSIS — Z8739 Personal history of other diseases of the musculoskeletal system and connective tissue: Secondary | ICD-10-CM | POA: Insufficient documentation

## 2012-10-11 DIAGNOSIS — I5022 Chronic systolic (congestive) heart failure: Secondary | ICD-10-CM | POA: Insufficient documentation

## 2012-10-11 DIAGNOSIS — F329 Major depressive disorder, single episode, unspecified: Secondary | ICD-10-CM | POA: Insufficient documentation

## 2012-10-11 DIAGNOSIS — F3289 Other specified depressive episodes: Secondary | ICD-10-CM | POA: Insufficient documentation

## 2012-10-11 DIAGNOSIS — R5383 Other fatigue: Secondary | ICD-10-CM | POA: Insufficient documentation

## 2012-10-11 DIAGNOSIS — Z8669 Personal history of other diseases of the nervous system and sense organs: Secondary | ICD-10-CM | POA: Insufficient documentation

## 2012-10-11 DIAGNOSIS — R531 Weakness: Secondary | ICD-10-CM

## 2012-10-11 LAB — BASIC METABOLIC PANEL
BUN: 10 mg/dL (ref 6–23)
CO2: 31 mEq/L (ref 19–32)
Calcium: 8.8 mg/dL (ref 8.4–10.5)
Chloride: 104 mEq/L (ref 96–112)
Creatinine, Ser: 0.91 mg/dL (ref 0.50–1.10)
GFR calc Af Amer: 84 mL/min — ABNORMAL LOW (ref >90)
GFR calc non Af Amer: 73 mL/min — ABNORMAL LOW (ref >90)
Glucose, Bld: 115 mg/dL — ABNORMAL HIGH (ref 70–99)
Potassium: 3 mEq/L — ABNORMAL LOW (ref 3.5–5.1)
Sodium: 144 mEq/L (ref 135–145)

## 2012-10-11 LAB — URINALYSIS, ROUTINE W REFLEX MICROSCOPIC
Bilirubin Urine: NEGATIVE
Glucose, UA: NEGATIVE mg/dL
Hgb urine dipstick: NEGATIVE
Ketones, ur: NEGATIVE mg/dL
Leukocytes, UA: NEGATIVE
Nitrite: NEGATIVE
Protein, ur: NEGATIVE mg/dL
Specific Gravity, Urine: 1.017 (ref 1.005–1.030)
Urobilinogen, UA: 0.2 mg/dL (ref 0.0–1.0)
pH: 6 (ref 5.0–8.0)

## 2012-10-11 LAB — CBC WITH DIFFERENTIAL/PLATELET
Basophils Absolute: 0.1 10*3/uL (ref 0.0–0.1)
Basophils Relative: 1 % (ref 0–1)
Eosinophils Absolute: 0.3 10*3/uL (ref 0.0–0.7)
Eosinophils Relative: 5 % (ref 0–5)
HCT: 32.6 % — ABNORMAL LOW (ref 36.0–46.0)
Hemoglobin: 11.3 g/dL — ABNORMAL LOW (ref 12.0–15.0)
Lymphocytes Relative: 20 % (ref 12–46)
Lymphs Abs: 1.2 10*3/uL (ref 0.7–4.0)
MCH: 29.8 pg (ref 26.0–34.0)
MCHC: 34.7 g/dL (ref 30.0–36.0)
MCV: 86 fL (ref 78.0–100.0)
Monocytes Absolute: 0.6 10*3/uL (ref 0.1–1.0)
Monocytes Relative: 10 % (ref 3–12)
Neutro Abs: 3.7 10*3/uL (ref 1.7–7.7)
Neutrophils Relative %: 64 % (ref 43–77)
Platelets: 232 10*3/uL (ref 150–400)
RBC: 3.79 MIL/uL — ABNORMAL LOW (ref 3.87–5.11)
RDW: 14.2 % (ref 11.5–15.5)
WBC: 5.8 10*3/uL (ref 4.0–10.5)

## 2012-10-11 LAB — CG4 I-STAT (LACTIC ACID): Lactic Acid, Venous: 0.98 mmol/L (ref 0.5–2.2)

## 2012-10-11 MED ORDER — OXYCODONE-ACETAMINOPHEN 5-325 MG PO TABS
1.0000 | ORAL_TABLET | Freq: Once | ORAL | Status: AC
  Administered 2012-10-11: 1 via ORAL
  Filled 2012-10-11: qty 1

## 2012-10-11 MED ORDER — POTASSIUM CHLORIDE CRYS ER 20 MEQ PO TBCR
40.0000 meq | EXTENDED_RELEASE_TABLET | Freq: Once | ORAL | Status: AC
  Administered 2012-10-11: 40 meq via ORAL
  Filled 2012-10-11: qty 2

## 2012-10-11 NOTE — ED Notes (Signed)
Pt arrives from home with c/o back pain hx of MS, reports increased falls and weakness in legs. Also reports odorous urine. Hx of UTI.

## 2012-10-11 NOTE — ED Notes (Signed)
[id number]<AV> Expected date:10/11/12<BR> Expected time: 6:09 PM<BR> Means of arrival:Ambulance<BR> Comments:<BR> UTI/Falls

## 2012-10-11 NOTE — ED Notes (Signed)
Pt states she woke up on the floor, does not remember or know if she fell. States she may have been sleeping and woke up to drink water and fell. States someone was home to help her and call ems. C/o lower abd pain, hx of UTIs

## 2012-10-12 NOTE — ED Notes (Signed)
Patient is alert and oriented x3.  She was given DC instructions and follow up visit instructions.  Patient gave verbal understanding. She was DC via [facility] to home.  V/S stable.  He was not showing any signs of distress on DC

## 2012-10-12 NOTE — ED Notes (Signed)
[facility] called for transport.  

## 2012-10-14 NOTE — ED Provider Notes (Signed)
History    51yF with multiple complaints. Primarily seems to be generalized weakness. Ongoing for at least days. Unable to give exact onset. Feel yesterday because legs weak. Hx of MS and says no acute change in any focal weakness, just that generally feels fatigued. No fever or chills. No v/d. Urine recently dark in color and strong smelling. No dysuria.  No cp or sob.   CSN: [id number]  Arrival date & time 10/11/12  1815   First MD Initiated Contact with Patient 10/11/12 1854      Chief Complaint  Patient presents with  . Weakness    (Consider location/radiation/quality/duration/timing/severity/associated sxs/prior treatment) HPI  Past Medical History  Diagnosis Date  . MS (multiple sclerosis)     a. Dx'd late 20's. b. Tx with Novantrone, Tysabri, Copaxone previously.  Marland Kitchen Hypertension   . Depression   . Glaucoma(365)   . Cardiomyopathy     a.  Echo 04/29/12: Mild LVH, EF 20-25%, mild AI, moderate MR, moderate LAE, mild RAE, mild RVE, moderate TR, PASP 51, small pericardial effusion;   b. probably non-ischemic given multiple chemo-Tx agents used for MS and global LV dysfn on echo  . Chronic systolic heart failure   . CHF (congestive heart failure)   . Shortness of breath   . Neuromuscular disorder     MS    Past Surgical History  Procedure Laterality Date  . Ablation      uterine  . Cesarean section      Family History  Problem Relation Age of Onset  . Hypertension Mother     History  Substance Use Topics  . Smoking status: Never Smoker   . Smokeless tobacco: Never Used  . Alcohol Use: No    OB History   Grav Para Term Preterm Abortions TAB SAB Ect Mult Living                  Review of Systems  All systems reviewed and negative, other than as noted in HPI.   Allergies  Sulfonamide derivatives and Penicillins  Home Medications   Current Outpatient Rx  Name  Route  Sig  Dispense  Refill  . amantadine (SYMMETREL) 100 MG capsule   Oral   Take 100  mg by mouth 2 (two) times daily.          . carvedilol (COREG) 6.25 MG tablet   Oral   Take 1 tablet (6.25 mg total) by mouth 2 (two) times daily with a meal.   60 tablet   3   . digoxin (LANOXIN) 0.25 MG tablet   Oral   Take 1 tablet (0.25 mg total) by mouth daily.         . furosemide (LASIX) 40 MG tablet   Oral   Take 1 tablet (40 mg total) by mouth daily.   30 tablet   1   . gabapentin (NEURONTIN) 600 MG tablet   Oral   Take 600 mg by mouth 3 (three) times daily.          Marland Kitchen imipramine (TOFRANIL) 50 MG tablet   Oral   Take 100 mg by mouth at bedtime.           . nitrofurantoin (MACRODANTIN) 100 MG capsule   Oral   Take 100 mg by mouth daily.         . potassium chloride SA (K-DUR,KLOR-CON) 20 MEQ tablet   Oral   Take 40 mEq by mouth daily.    1 tablet  0   . solifenacin (VESICARE) 10 MG tablet   Oral   Take 10 mg by mouth 2 (two) times daily.          Marland Kitchen spironolactone (ALDACTONE) 25 MG tablet   Oral   Take 25 mg by mouth every morning.         . temazepam (RESTORIL) 15 MG capsule   Oral   Take 1 capsule (15 mg total) by mouth at bedtime as needed for sleep.   10 capsule   0   . Travoprost, BAK Free, (TRAVATAN) 0.004 % SOLN ophthalmic solution   Both Eyes   Place 1 drop into both eyes at bedtime.          Marland Kitchen zolpidem (AMBIEN) 10 MG tablet   Oral   Take 10 mg by mouth at bedtime as needed for sleep.           BP 150/77  Pulse 104  Temp(Src) 99 F (37.2 C) (Rectal)  Resp 18  SpO2 100%  Physical Exam  Nursing note and vitals reviewed. Constitutional: She is oriented to person, place, and time. She appears well-developed and well-nourished. No distress.  Laying in bed. NAD. Obese.  HENT:  Head: Normocephalic and atraumatic.  Eyes: Conjunctivae are normal. Right eye exhibits no discharge. Left eye exhibits no discharge.  Neck: Neck supple.  Cardiovascular: Normal rate, regular rhythm and normal heart sounds.  Exam reveals no  gallop and no friction rub.   No murmur heard. Pulmonary/Chest: Effort normal and breath sounds normal. No respiratory distress.  Abdominal: Soft. She exhibits no distension. There is no tenderness.  Genitourinary:  No cva tenderness  Musculoskeletal: She exhibits no edema and no tenderness.  Neurological: She is alert and oriented to person, place, and time. No cranial nerve deficit. She exhibits normal muscle tone. Coordination normal.  Skin: Skin is warm and dry.  Psychiatric: She has a normal mood and affect. Her behavior is normal. Thought content normal.    ED Course  Procedures (including critical care time)  Labs Reviewed  CBC WITH DIFFERENTIAL - Abnormal; Notable for the following:    RBC 3.79 (*)    Hemoglobin 11.3 (*)    HCT 32.6 (*)    All other components within normal limits  BASIC METABOLIC PANEL - Abnormal; Notable for the following:    Potassium 3.0 (*)    Glucose, Bld 115 (*)    GFR calc non Af Amer 73 (*)    GFR calc Af Amer 84 (*)    All other components within normal limits  URINALYSIS, ROUTINE W REFLEX MICROSCOPIC  CG4 I-STAT (LACTIC ACID)   No results found.  EKG:  Rhythm: normal sinus Vent. rate 98 BPM PR interval 172 ms QRS duration 96 ms QT/QTc 368/470 ms ST segments: NS ST changes   1. Generalized weakness       MDM  51yF with generalized weakness. W/u fairly unremarkable. HD stable. Low suspicion for urgent/emergent etiology. I feel safe for DC at this time for outpt FU.       Raeford Razor, MD 10/14/12 1046

## 2012-11-05 ENCOUNTER — Emergency Department (HOSPITAL_COMMUNITY): Payer: PRIVATE HEALTH INSURANCE

## 2012-11-05 ENCOUNTER — Encounter (HOSPITAL_COMMUNITY): Payer: Self-pay | Admitting: Emergency Medicine

## 2012-11-05 ENCOUNTER — Emergency Department (HOSPITAL_COMMUNITY)
Admission: EM | Admit: 2012-11-05 | Discharge: 2012-11-05 | Disposition: A | Discharge status: Home / Self Care | Payer: PRIVATE HEALTH INSURANCE | Attending: Emergency Medicine | Admitting: Emergency Medicine

## 2012-11-05 DIAGNOSIS — G35 Multiple sclerosis: Secondary | ICD-10-CM | POA: Insufficient documentation

## 2012-11-05 DIAGNOSIS — I5022 Chronic systolic (congestive) heart failure: Secondary | ICD-10-CM | POA: Insufficient documentation

## 2012-11-05 DIAGNOSIS — R609 Edema, unspecified: Secondary | ICD-10-CM | POA: Insufficient documentation

## 2012-11-05 DIAGNOSIS — I509 Heart failure, unspecified: Secondary | ICD-10-CM | POA: Insufficient documentation

## 2012-11-05 DIAGNOSIS — H409 Unspecified glaucoma: Secondary | ICD-10-CM | POA: Insufficient documentation

## 2012-11-05 DIAGNOSIS — F3289 Other specified depressive episodes: Secondary | ICD-10-CM | POA: Insufficient documentation

## 2012-11-05 DIAGNOSIS — Z79899 Other long term (current) drug therapy: Secondary | ICD-10-CM | POA: Insufficient documentation

## 2012-11-05 DIAGNOSIS — R0602 Shortness of breath: Secondary | ICD-10-CM | POA: Insufficient documentation

## 2012-11-05 DIAGNOSIS — I428 Other cardiomyopathies: Secondary | ICD-10-CM | POA: Insufficient documentation

## 2012-11-05 DIAGNOSIS — R600 Localized edema: Secondary | ICD-10-CM

## 2012-11-05 DIAGNOSIS — R51 Headache: Secondary | ICD-10-CM | POA: Insufficient documentation

## 2012-11-05 DIAGNOSIS — I10 Essential (primary) hypertension: Secondary | ICD-10-CM | POA: Insufficient documentation

## 2012-11-05 DIAGNOSIS — F329 Major depressive disorder, single episode, unspecified: Secondary | ICD-10-CM | POA: Insufficient documentation

## 2012-11-05 LAB — COMPREHENSIVE METABOLIC PANEL
Alkaline Phosphatase: 105 U/L (ref 39–117)
BUN: 13 mg/dL (ref 6–23)
CO2: 33 mEq/L — ABNORMAL HIGH (ref 19–32)
Chloride: 98 mEq/L (ref 96–112)
Creatinine, Ser: 1.07 mg/dL (ref 0.50–1.10)
GFR calc non Af Amer: 60 mL/min — ABNORMAL LOW (ref >90)
Total Bilirubin: 0.3 mg/dL (ref 0.3–1.2)

## 2012-11-05 LAB — CBC WITH DIFFERENTIAL/PLATELET
HCT: 31.5 % — ABNORMAL LOW (ref 36.0–46.0)
Hemoglobin: 10.9 g/dL — ABNORMAL LOW (ref 12.0–15.0)
Lymphocytes Relative: 16 % (ref 12–46)
Lymphs Abs: 0.9 10*3/uL (ref 0.7–4.0)
Monocytes Absolute: 0.5 10*3/uL (ref 0.1–1.0)
Monocytes Relative: 10 % (ref 3–12)
Neutro Abs: 3.7 10*3/uL (ref 1.7–7.7)
RBC: 3.73 MIL/uL — ABNORMAL LOW (ref 3.87–5.11)
WBC: 5.3 10*3/uL (ref 4.0–10.5)

## 2012-11-05 LAB — DIGOXIN LEVEL: Digoxin Level: 0.8 ng/mL (ref 0.8–2.0)

## 2012-11-05 LAB — PRO B NATRIURETIC PEPTIDE: Pro B Natriuretic peptide (BNP): 72.2 pg/mL (ref 0–125)

## 2012-11-05 MED ORDER — FUROSEMIDE 40 MG PO TABS: 40.0000 mg | ORAL_TABLET | Freq: Two times a day (BID) | ORAL | Status: DC

## 2012-11-05 MED ORDER — POTASSIUM CHLORIDE CRYS ER 20 MEQ PO TBCR
40.0000 meq | EXTENDED_RELEASE_TABLET | Freq: Once | ORAL | Status: AC
  Administered 2012-11-05: 40 meq via ORAL
  Filled 2012-11-05: qty 2

## 2012-11-05 MED ORDER — FUROSEMIDE 10 MG/ML IJ SOLN
40.0000 mg | INTRAMUSCULAR | Status: AC
  Administered 2012-11-05: 40 mg via INTRAVENOUS
  Filled 2012-11-05: qty 4

## 2012-11-05 NOTE — ED Notes (Signed)
[id number]<JY> Expected date:<BR> Expected time:<BR> Means of arrival:<BR> Comments:<BR> ems pitting edema in foot

## 2012-11-05 NOTE — ED Provider Notes (Signed)
History     CSN: [id number]  Arrival date & time 11/05/12  1409   First MD Initiated Contact with Patient 11/05/12 1504      Chief Complaint  Patient presents with  . Foot Pain    (Consider location/radiation/quality/duration/timing/severity/associated sxs/prior treatment) HPI  Patient is a 52 year old female past medical history significant for CHF, HTN, MS presenting for bilateral leg swelling w/o erythema or wound 6/10 pain that began four days ago despite taking home Lasix as prescribed, 40mg  daily. Pt had some associated shortness of breath but has passed and is not short of breath in ED and denies any SOB today. Pt last echo done in March 2014 with EF 50-50% improvement from echo done in 04/2012. Denies cough, fevers, chills, nausea, vomiting, urinary symptoms.   Past Medical History  Diagnosis Date  . MS (multiple sclerosis)     a. Dx'd late 20's. b. Tx with Novantrone, Tysabri, Copaxone previously.  Marland Kitchen Hypertension   . Depression   . Glaucoma(365)   . Cardiomyopathy     a.  Echo 04/29/12: Mild LVH, EF 20-25%, mild AI, moderate MR, moderate LAE, mild RAE, mild RVE, moderate TR, PASP 51, small pericardial effusion;   b. probably non-ischemic given multiple chemo-Tx agents used for MS and global LV dysfn on echo  . Chronic systolic heart failure   . CHF (congestive heart failure)   . Shortness of breath   . Neuromuscular disorder     MS    Past Surgical History  Procedure Laterality Date  . Ablation      uterine  . Cesarean section      Family History  Problem Relation Age of Onset  . Hypertension Mother     History  Substance Use Topics  . Smoking status: Never Smoker   . Smokeless tobacco: Never Used  . Alcohol Use: No    OB History   Grav Para Term Preterm Abortions TAB SAB Ect Mult Living                  Review of Systems  Constitutional: Negative for fever and chills.  HENT: Negative for neck pain.   Eyes: Negative for visual disturbance.   Respiratory: Negative for cough and shortness of breath.   Cardiovascular: Positive for leg swelling. Negative for chest pain.  Gastrointestinal: Negative for nausea, vomiting, abdominal pain and diarrhea.  Genitourinary: Negative for dysuria.  Musculoskeletal: Negative for back pain.  Skin: Negative for color change and wound.  Neurological: Positive for headaches.    Allergies  Sulfonamide derivatives and Penicillins  Home Medications   Current Outpatient Rx  Name  Route  Sig  Dispense  Refill  . amantadine (SYMMETREL) 100 MG capsule   Oral   Take 100 mg by mouth 2 (two) times daily.          . carvedilol (COREG) 6.25 MG tablet   Oral   Take 6.25 mg by mouth 2 (two) times daily with a meal.         . digoxin (LANOXIN) 0.25 MG tablet   Oral   Take 0.25 mg by mouth daily.         . furosemide (LASIX) 40 MG tablet   Oral   Take 40 mg by mouth daily.         Marland Kitchen gabapentin (NEURONTIN) 600 MG tablet   Oral   Take 600 mg by mouth 3 (three) times daily.          Marland Kitchen imipramine (  TOFRANIL) 50 MG tablet   Oral   Take 100 mg by mouth at bedtime.           . nitrofurantoin (MACRODANTIN) 100 MG capsule   Oral   Take 100 mg by mouth daily.         . potassium chloride SA (K-DUR,KLOR-CON) 20 MEQ tablet   Oral   Take 40 mEq by mouth daily.    1 tablet   0   . solifenacin (VESICARE) 10 MG tablet   Oral   Take 10 mg by mouth 2 (two) times daily.          Marland Kitchen spironolactone (ALDACTONE) 25 MG tablet   Oral   Take 25 mg by mouth every morning.         . temazepam (RESTORIL) 15 MG capsule   Oral   Take 15 mg by mouth at bedtime as needed for sleep.         . Travoprost, BAK Free, (TRAVATAN) 0.004 % SOLN ophthalmic solution   Both Eyes   Place 1 drop into both eyes at bedtime.          Marland Kitchen zolpidem (AMBIEN) 10 MG tablet   Oral   Take 10 mg by mouth at bedtime as needed for sleep.         . furosemide (LASIX) 40 MG tablet   Oral   Take 1 tablet (40  mg total) by mouth 2 (two) times daily.   6 tablet   0     BP 125/61  Pulse 91  Temp(Src) 98.4 F (36.9 C) (Oral)  Resp 17  Wt 196 lb 9 oz (89.16 kg)  BMI 32.71 kg/m2  SpO2 97%  Physical Exam  Constitutional: She is oriented to person, place, and time. She appears well-developed and well-nourished. No distress.  HENT:  Head: Normocephalic and atraumatic.  Mouth/Throat: Oropharynx is clear and moist.  Eyes: EOM are normal. Pupils are equal, round, and reactive to light.  Neck: Normal range of motion. Neck supple.  Cardiovascular: Normal rate, regular rhythm, normal heart sounds and intact distal pulses.   1+ pitting bilateral edema Dopplerable bilateral DP and PT  Pulmonary/Chest: Effort normal and breath sounds normal. No respiratory distress. She has no wheezes. She exhibits no tenderness.  Abdominal: Soft. Bowel sounds are normal. There is no tenderness.  Neurological: She is alert and oriented to person, place, and time.  Skin: Skin is warm and dry. No rash noted. She is not diaphoretic. No erythema. No pallor.  Psychiatric: She has a normal mood and affect.    ED Course  Procedures (including critical care time)   Date: 11/05/2012  Rate: 89  Rhythm: normal sinus rhythm  QRS Axis: normal  Intervals: PR prolonged  ST/T Wave abnormalities: scooped ST segment  Conduction Disutrbances:first-degree A-V block   Narrative Interpretation:   Old EKG Reviewed: changes noted   Labs Reviewed  CBC WITH DIFFERENTIAL - Abnormal; Notable for the following:    RBC 3.73 (*)    Hemoglobin 10.9 (*)    HCT 31.5 (*)    All other components within normal limits  COMPREHENSIVE METABOLIC PANEL - Abnormal; Notable for the following:    Potassium 3.3 (*)    CO2 33 (*)    Glucose, Bld 119 (*)    Albumin 3.1 (*)    GFR calc non Af Amer 60 (*)    GFR calc Af Amer 69 (*)    All other components within normal limits  PRO B NATRIURETIC PEPTIDE  DIGOXIN LEVEL   Dg Chest 2  View  11/05/2012  *RADIOLOGY REPORT*  Clinical Data: Shortness of breath, chest pain  CHEST - 2 VIEW  Comparison: 09/06/2012  Findings: Cardiomediastinal silhouette is stable.  Mild elevation of the left hemidiaphragm.  No segmental infiltrate or pulmonary edema.  IMPRESSION: No active disease.  Mild elevation of the left hemidiaphragm.   Original Report Authenticated By: Natasha Mead, M.D.      1. Edema of both legs       MDM  Pt presenting w/ bilateral LE edema that began four days ago despite taking Lasix as prescribed. On PE 1+ edema noted on bilateral LE w/o erythema or open wounds, dopplerable bilateral DP and PTs. Rest of examination benign. Pt given IV Lasix and PO potassium in ED. Labs wnl, EKG stable, and CXR w/o acute findings. Patient advised to increased Lasix dose for the next three days and to follow up with her PCP. Patient agreeable to plan. Edema most likely dependent edema rather than due to acute exacerbation of CHF. Patient d/w with Dr. Juleen China, agrees with plan. Patient is stable at time of discharge.          Jeannetta Ellis, PA-C 11/06/12 0136

## 2012-11-05 NOTE — ED Notes (Signed)
PER EMS-pt picked up from home with c/o bilateral feet pain and pitting edema x4 days.  Pt has hx of CHF,MS, and HTN.  Reports pain 6/10. Alert and oriented.

## 2012-11-11 NOTE — ED Provider Notes (Signed)
Medical screening examination/treatment/procedure(s) were conducted as a shared visit with non-physician practitioner(s) and myself.  I personally evaluated the patient during the encounter.  51yF with LE edema. Doubt DVT. Not consistent with infectious process. Renal function ok. No respiratory distress. Plan brief increase in lasix.   Raeford Razor, MD 11/11/12 [id number]

## 2012-11-28 ENCOUNTER — Ambulatory Visit: Payer: PRIVATE HEALTH INSURANCE | Admitting: Physician Assistant

## 2012-12-15 ENCOUNTER — Ambulatory Visit (INDEPENDENT_AMBULATORY_CARE_PROVIDER_SITE_OTHER): Payer: PRIVATE HEALTH INSURANCE | Admitting: Physician Assistant

## 2012-12-15 ENCOUNTER — Encounter: Payer: Self-pay | Admitting: Physician Assistant

## 2012-12-15 VITALS — BP 116/72 | HR 82

## 2012-12-15 DIAGNOSIS — I10 Essential (primary) hypertension: Secondary | ICD-10-CM

## 2012-12-15 DIAGNOSIS — I5032 Chronic diastolic (congestive) heart failure: Secondary | ICD-10-CM

## 2012-12-15 DIAGNOSIS — R0602 Shortness of breath: Secondary | ICD-10-CM

## 2012-12-15 DIAGNOSIS — R609 Edema, unspecified: Secondary | ICD-10-CM

## 2012-12-15 DIAGNOSIS — N319 Neuromuscular dysfunction of bladder, unspecified: Secondary | ICD-10-CM

## 2012-12-15 DIAGNOSIS — G35 Multiple sclerosis: Secondary | ICD-10-CM

## 2012-12-15 NOTE — Progress Notes (Signed)
[address] [place], [place]  [address] Phone: [phone] Fax:  [phone]  Date:  12/15/2012   ID:  Shannon Garrett, DOB [birth year], MRN [id number]  PCP:  Marletta Lor, NP  Cardiologist:  Dr. Tonny Bollman     History of Present Illness: Shannon Garrett is a 52 y.o. female who returns for follow up.  She has a hx of multiple sclerosis, HTN and depression. She is limited by her multiple sclerosis and is "scooter-bound." Echo in 6/12 demonstrated normal LVF. She has been treated with Novantrone, Tysabri, Copaxone, Symmetrel and Dimethyl Fumerate for her MS. She was admitted 04/2012 with new onset systolic CHF. CT was negative for pulmonary embolism. Findings were suggestive of pulmonary edema. Echo 04/29/12: Mild LVH, EF 20-25%, mild AI, moderate MR, moderate LAE, mild RAE, mild RVE, moderate TR, PASP 51, small pericardial effusion. 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 She has been exposed to multiple chemotherapeutic agents in the past including Novantrone which can cause CHF months to years after discontinuation. Medical therapy recommended for CHF. Ischemic evaluation was not felt to be necessary.  Medications were titrated for CHF.  She was followed by the CHF clinic until 08/2012.  She had a f/u echo 3/14: mild LVH, EF 50-55%, grade 1 diastolic dysfunction, mild AI, MAC, mild LAE.  With her normalized LV function, her digoxin was stopped and it was recommended that she follow up in the CHF clinic as needed.  She is somewhat of a poor historian.  She notes some increased dyspnea in the mornings.  This is not consistent.  She denies chest pain.  No syncope.  She sleeps on an incline in her hospital bed.  She does not feel like she is urinating as much as she did in the past.  I believe she has a neurogenic bladder.  She does not do in and out caths.  She has a urologist.  She notes her LE edema is worse.    Labs (10/13):  K 3.9, creatinine 1.02,  ALT 293 => 150, Alb 3, TP 5.8, BNP 10,618, LDL 108, Hgb 10.3, TSH 3.510  Labs (11/13):  K 3.2, creatinine 0.9, Hgb 10.6, ALT 49 Labs (5/14):    K 3.3, Cr 1.07, ALT 11, Alb 3.1, proBNP 72.2, Hgb 10.9   Wt Readings from Last 3 Encounters:  11/05/12 196 lb 9 oz (89.16 kg)  09/29/12 204 lb 12.8 oz (92.897 kg)  09/09/12 203 lb 0.7 oz (92.1 kg)     Past Medical History  Diagnosis Date  . MS (multiple sclerosis)     a. Dx'd late 20's. b. Tx with Novantrone, Tysabri, Copaxone previously.  Marland Kitchen Hypertension   . Depression   . Glaucoma   . Cardiomyopathy     a.  Echo 04/29/12: Mild LVH, EF 20-25%, mild AI, moderate MR, moderate LAE, mild RAE, mild RVE, moderate TR, PASP 51, small pericardial effusion;   b. probably non-ischemic given multiple chemo-Tx agents used for MS and global LV dysfn on echo  . Chronic systolic heart failure   . CHF (congestive heart failure)   . Shortness of breath   . Neuromuscular disorder     MS    Current Outpatient Prescriptions  Medication Sig Dispense Refill  . amantadine (SYMMETREL) 100 MG capsule Take 100 mg by mouth 2 (two) times daily.       . carvedilol (COREG) 6.25 MG tablet Take 6.25 mg by mouth 2 (two) times daily with a  meal.      . digoxin (LANOXIN) 0.25 MG tablet Take 0.25 mg by mouth daily.      . furosemide (LASIX) 40 MG tablet Take 1 tablet (40 mg total) by mouth 2 (two) times daily.  6 tablet  0  . gabapentin (NEURONTIN) 600 MG tablet Take 600 mg by mouth 3 (three) times daily.       Marland Kitchen imipramine (TOFRANIL) 50 MG tablet Take 100 mg by mouth at bedtime.        . nitrofurantoin (MACRODANTIN) 100 MG capsule Take 100 mg by mouth daily.      . potassium chloride SA (K-DUR,KLOR-CON) 20 MEQ tablet Take 40 mEq by mouth daily.   1 tablet  0  . solifenacin (VESICARE) 10 MG tablet Take 10 mg by mouth 2 (two) times daily.       Marland Kitchen spironolactone (ALDACTONE) 25 MG tablet Take 25 mg by mouth every morning.      . temazepam (RESTORIL) 15 MG capsule Take 15 mg by  mouth at bedtime as needed for sleep.      . Travoprost, BAK Free, (TRAVATAN) 0.004 % SOLN ophthalmic solution Place 1 drop into both eyes at bedtime.       Marland Kitchen zolpidem (AMBIEN) 10 MG tablet Take 10 mg by mouth at bedtime as needed for sleep.       No current facility-administered medications for this visit.    Allergies:    Allergies  Allergen Reactions  . Sulfonamide Derivatives Shortness Of Breath  . Penicillins Hives and Itching    Social History:  The patient  reports that she has never smoked. She has never used smokeless tobacco. She reports that she does not drink alcohol or use illicit drugs.   ROS:  Please see the history of present illness.      All other systems reviewed and negative.   PHYSICAL EXAM: VS:  BP 116/72  Pulse 82 Chronically ill appearing female in a wheelchair in no acute distress HEENT: normal Neck: no JVD at 90 degrees Cardiac:  normal S1, S2; RRR; no murmur Lungs:  clear to auscultation bilaterally, no wheezing, rhonchi or rales Abd: soft, nontender  Ext: 1+ bilateral LE edema; few small blisters noted on right LE Skin: warm and dry Neuro:  CNs 2-12 intact, no focal abnormalities noted  EKG:  NSR, HR 82, diffuse ST changes (? Digitalis effect)     ASSESSMENT AND PLAN:  1. Chronic Diastolic CHF:  She has had recovery of her EF to normal.  She does have mild diastolic dysfunction.  I suspect her LE edema is secondary to a combination of diastolic dysfunction in the setting of dependent edema and inactivity as well as protein calorie malnutrition from chronic disease.  I have recommended she increase her Lasix to 80 mg in the AM and 40 mg in the PM for 3 days and increase K+ to 40 mEq in the AM and 20 mEq in the PM for 3 days.  Check BMET and BNP today and repeat BMET in 1 week.  She is still taking Digoxin.  As she now has normal LVF, she can stop this as there is a higher rate of mortality in patients with normal LVF on digoxin.   2. Neurogenic Bladder:   She will f/u with her urologist.   3. Hypertension:  Controlled. 4. Multiple Sclerosis:  She follows with neurology in [place]. 5. Disposition:  F/u with me in 1 month.   Signed, Tereso Newcomer, PA-C  12/15/2012 3:58 PM

## 2012-12-15 NOTE — Patient Instructions (Addendum)
Stop Digoxin   Increase Lasix 80 mg in morning 40 mg in afternoon for 3 days then resume 40 mg twice a day.   Increase Potassium 40 meq in morning 20 meq in afternoon for 3 days then resume 40 meq daily.   Lab work today ( bmet,bnp )   Repeat lab work  In 1 week ( bmet )   Your physician recommends that you schedule a follow-up appointment in: 1 month

## 2012-12-16 ENCOUNTER — Telehealth: Payer: Self-pay | Admitting: *Deleted

## 2012-12-16 LAB — BASIC METABOLIC PANEL
BUN: 12 mg/dL (ref 6–23)
CO2: 35 mEq/L — ABNORMAL HIGH (ref 19–32)
Calcium: 9.1 mg/dL (ref 8.4–10.5)
Creatinine, Ser: 1.1 mg/dL (ref 0.4–1.2)

## 2012-12-16 LAB — BRAIN NATRIURETIC PEPTIDE: Pro B Natriuretic peptide (BNP): 15 pg/mL (ref 0.0–100.0)

## 2012-12-16 NOTE — Telephone Encounter (Signed)
lmptcb to go over lab results, tried cell # but not taking calls right now. I will try again tomorrow. I then re-tried home # and pt answered and notw notified about lab results and dose change on K+ x 3 days, bmet 6/23, pt verbalized understanding  INCREASE POTASSIUM TO 40 MEQ BID X 3 DAYS THEN RESUME 40 MEQ DAILY AFTER 3 DAYS

## 2012-12-16 NOTE — Telephone Encounter (Signed)
Message copied by Tarri Fuller on Tue Dec 16, 2012  5:43 PM ------      Message from: [place], [place] T      Created: Tue Dec 16, 2012  5:42 PM       Increase K+ 40 mEq twice a day x3 days      Then continue on K+ 60 mEq daily      Check a basic metabolic panel in one week ------

## 2012-12-22 ENCOUNTER — Telehealth: Payer: Self-pay | Admitting: Internal Medicine

## 2012-12-22 ENCOUNTER — Other Ambulatory Visit (INDEPENDENT_AMBULATORY_CARE_PROVIDER_SITE_OTHER): Payer: PRIVATE HEALTH INSURANCE

## 2012-12-22 DIAGNOSIS — R7989 Other specified abnormal findings of blood chemistry: Secondary | ICD-10-CM

## 2012-12-22 DIAGNOSIS — D649 Anemia, unspecified: Secondary | ICD-10-CM

## 2012-12-22 DIAGNOSIS — R0602 Shortness of breath: Secondary | ICD-10-CM

## 2012-12-22 DIAGNOSIS — I5023 Acute on chronic systolic (congestive) heart failure: Secondary | ICD-10-CM

## 2012-12-22 DIAGNOSIS — I10 Essential (primary) hypertension: Secondary | ICD-10-CM

## 2012-12-22 LAB — BASIC METABOLIC PANEL
Calcium: 9.5 mg/dL (ref 8.4–10.5)
Creatinine, Ser: 1.1 mg/dL (ref 0.4–1.2)
GFR: 66.88 mL/min (ref >60.00)
Glucose, Bld: 126 mg/dL — ABNORMAL HIGH (ref 70–99)
Sodium: 139 mEq/L (ref 135–145)

## 2012-12-22 NOTE — Telephone Encounter (Signed)
Pt signed ROI, picked up copy Of Discharge Summary  12/22/12/KM

## 2012-12-23 ENCOUNTER — Other Ambulatory Visit: Payer: Self-pay | Admitting: *Deleted

## 2012-12-23 DIAGNOSIS — E876 Hypokalemia: Secondary | ICD-10-CM

## 2013-01-06 ENCOUNTER — Other Ambulatory Visit: Payer: PRIVATE HEALTH INSURANCE

## 2013-01-14 ENCOUNTER — Other Ambulatory Visit: Payer: Self-pay | Admitting: *Deleted

## 2013-01-14 MED ORDER — DIGOXIN 250 MCG PO TABS: 0.2500 mg | ORAL_TABLET | Freq: Every day | ORAL | Status: DC

## 2013-02-12 ENCOUNTER — Other Ambulatory Visit (HOSPITAL_COMMUNITY): Payer: Self-pay | Admitting: Cardiology

## 2013-02-12 ENCOUNTER — Other Ambulatory Visit: Payer: Self-pay | Admitting: *Deleted

## 2013-02-12 DIAGNOSIS — I5032 Chronic diastolic (congestive) heart failure: Secondary | ICD-10-CM

## 2013-02-12 DIAGNOSIS — G35 Multiple sclerosis: Secondary | ICD-10-CM | POA: Insufficient documentation

## 2013-02-12 MED ORDER — DIGOXIN 250 MCG PO TABS: 0.2500 mg | ORAL_TABLET | Freq: Every day | ORAL | Status: DC

## 2013-02-12 NOTE — Telephone Encounter (Signed)
Pt called to request refills °

## 2013-02-13 ENCOUNTER — Other Ambulatory Visit: Payer: Self-pay | Admitting: *Deleted

## 2013-03-03 NOTE — Telephone Encounter (Signed)
In reviewing the pt's chart I noticed that the pt's digoxin was discontinued at 12/15/12 office visit with Tereso Newcomer PA-C.  I attempted to reach the pt to stop this medication but she did not answer and I could not leave a voicemail.  I called the pt's pharmacy and discontinued this medication from her profile.

## 2013-03-03 NOTE — Addendum Note (Signed)
Addended by: Iona Coach on: 03/03/2013 04:59 PM   Modules accepted: Orders, Medications

## 2013-03-20 ENCOUNTER — Emergency Department (HOSPITAL_COMMUNITY): Payer: PRIVATE HEALTH INSURANCE

## 2013-03-20 ENCOUNTER — Emergency Department (HOSPITAL_COMMUNITY)
Admission: EM | Admit: 2013-03-20 | Discharge: 2013-03-20 | Disposition: A | Discharge status: Home / Self Care | Payer: PRIVATE HEALTH INSURANCE | Attending: Emergency Medicine | Admitting: Emergency Medicine

## 2013-03-20 ENCOUNTER — Encounter (HOSPITAL_COMMUNITY): Payer: Self-pay | Admitting: *Deleted

## 2013-03-20 DIAGNOSIS — F3289 Other specified depressive episodes: Secondary | ICD-10-CM | POA: Insufficient documentation

## 2013-03-20 DIAGNOSIS — Z8669 Personal history of other diseases of the nervous system and sense organs: Secondary | ICD-10-CM | POA: Insufficient documentation

## 2013-03-20 DIAGNOSIS — H409 Unspecified glaucoma: Secondary | ICD-10-CM | POA: Insufficient documentation

## 2013-03-20 DIAGNOSIS — Z880 Allergy status to penicillin: Secondary | ICD-10-CM | POA: Insufficient documentation

## 2013-03-20 DIAGNOSIS — Z79899 Other long term (current) drug therapy: Secondary | ICD-10-CM | POA: Insufficient documentation

## 2013-03-20 DIAGNOSIS — S4980XA Other specified injuries of shoulder and upper arm, unspecified arm, initial encounter: Secondary | ICD-10-CM | POA: Insufficient documentation

## 2013-03-20 DIAGNOSIS — F329 Major depressive disorder, single episode, unspecified: Secondary | ICD-10-CM | POA: Insufficient documentation

## 2013-03-20 DIAGNOSIS — S4992XA Unspecified injury of left shoulder and upper arm, initial encounter: Secondary | ICD-10-CM

## 2013-03-20 DIAGNOSIS — Y9389 Activity, other specified: Secondary | ICD-10-CM | POA: Insufficient documentation

## 2013-03-20 DIAGNOSIS — I5022 Chronic systolic (congestive) heart failure: Secondary | ICD-10-CM | POA: Insufficient documentation

## 2013-03-20 DIAGNOSIS — S46909A Unspecified injury of unspecified muscle, fascia and tendon at shoulder and upper arm level, unspecified arm, initial encounter: Secondary | ICD-10-CM | POA: Insufficient documentation

## 2013-03-20 DIAGNOSIS — R296 Repeated falls: Secondary | ICD-10-CM | POA: Insufficient documentation

## 2013-03-20 DIAGNOSIS — I10 Essential (primary) hypertension: Secondary | ICD-10-CM | POA: Insufficient documentation

## 2013-03-20 DIAGNOSIS — Y929 Unspecified place or not applicable: Secondary | ICD-10-CM | POA: Insufficient documentation

## 2013-03-20 MED ORDER — OXYCODONE-ACETAMINOPHEN 5-325 MG PO TABS: 1.0000 | ORAL_TABLET | Freq: Four times a day (QID) | ORAL | Status: DC | PRN

## 2013-03-20 MED ORDER — MORPHINE SULFATE 4 MG/ML IJ SOLN
4.0000 mg | Freq: Once | INTRAMUSCULAR | Status: AC
  Administered 2013-03-20: 4 mg via INTRAMUSCULAR
  Filled 2013-03-20: qty 1

## 2013-03-20 MED ORDER — ONDANSETRON 4 MG PO TBDP
4.0000 mg | ORAL_TABLET | Freq: Once | ORAL | Status: AC
  Administered 2013-03-20: 4 mg via ORAL
  Filled 2013-03-20: qty 1

## 2013-03-20 NOTE — ED Notes (Signed)
Pt has a hx of MS and has been having increase of falls recently. Pt states that she has pain to lt shoulder from fall states that she is not able to lift her arm. brusing to upper shoulder area.

## 2013-03-20 NOTE — ED Notes (Signed)
Pt alert and oriented x4. Respirations even and unlabored, bilateral symmetrical rise and fall of chest. Skin warm and dry. In no acute distress. Denies needs.   

## 2013-03-20 NOTE — ED Notes (Signed)
Pt to xray

## 2013-03-20 NOTE — ED Provider Notes (Signed)
Medical screening examination/treatment/procedure(s) were performed by non-physician practitioner and as supervising physician I was immediately available for consultation/collaboration.   Lyanne Co, MD 03/20/13 1440

## 2013-03-20 NOTE — ED Notes (Signed)
RN called pt pcp Marletta Lor [phone]. PCP reports she will see pt next week like scheduled.

## 2013-03-20 NOTE — ED Provider Notes (Signed)
CSN: [id number]     Arrival date & time 03/20/13  1259 History   First MD Initiated Contact with Patient 03/20/13 1311     Chief Complaint  Patient presents with  . Arm Pain   (Consider location/radiation/quality/duration/timing/severity/associated sxs/prior Treatment) HPI  Shannon Garrett is a 52 y.o.female with a significant PMH of MS, hypertension, depression, glaucoma, cardiomyopathy, CHF presents to the ER with complaints of left shoulder pain. The patient has a motorized wheel chair that she mainly uses to get around. She can walk a few steps and typically walks from chair to bed or chair to toilet. Recently she has been falling more. She reports falling repeatedly on her left shoulder for the past few months. A week ago she fell on her left shoulder so hard that it has been hurting severely since then. She is being managed by her neurologist for her MS. She denies having head/neck injury or pain and denies LOC. She denies being on blood thinners.   Past Medical History  Diagnosis Date  . MS (multiple sclerosis)     a. Dx'd late 20's. b. Tx with Novantrone, Tysabri, Copaxone previously.  Marland Kitchen Hypertension   . Depression   . Glaucoma   . Cardiomyopathy     a.  Echo 04/29/12: Mild LVH, EF 20-25%, mild AI, moderate MR, moderate LAE, mild RAE, mild RVE, moderate TR, PASP 51, small pericardial effusion;   b. probably non-ischemic given multiple chemo-Tx agents used for MS and global LV dysfn on echo  . Chronic systolic heart failure   . CHF (congestive heart failure)   . Shortness of breath   . Neuromuscular disorder     MS   Past Surgical History  Procedure Laterality Date  . Ablation      uterine  . Cesarean section     Family History  Problem Relation Age of Onset  . Hypertension Mother    History  Substance Use Topics  . Smoking status: Never Smoker   . Smokeless tobacco: Never Used  . Alcohol Use: No   OB History   Grav Para Term Preterm Abortions TAB SAB Ect Mult Living                  Review of Systems  All other systems reviewed and are negative.    Allergies  Sulfonamide derivatives and Penicillins  Home Medications   Current Outpatient Rx  Name  Route  Sig  Dispense  Refill  . amantadine (SYMMETREL) 100 MG capsule   Oral   Take 100 mg by mouth 2 (two) times daily.          . carvedilol (COREG) 6.25 MG tablet   Oral   Take 6.25 mg by mouth 2 (two) times daily with a meal.         . furosemide (LASIX) 40 MG tablet   Oral   Take 1 tablet (40 mg total) by mouth 2 (two) times daily.   6 tablet   0   . gabapentin (NEURONTIN) 600 MG tablet   Oral   Take 600 mg by mouth 3 (three) times daily.          Marland Kitchen imipramine (TOFRANIL) 50 MG tablet   Oral   Take 100 mg by mouth at bedtime.           Marland Kitchen imipramine (TOFRANIL) 50 MG tablet   Oral   Take 100 mg by mouth at bedtime.         . nitrofurantoin (  MACRODANTIN) 100 MG capsule   Oral   Take 100 mg by mouth daily.         . potassium chloride SA (K-DUR,KLOR-CON) 20 MEQ tablet   Oral   Take 40 mEq by mouth daily.    1 tablet   0   . solifenacin (VESICARE) 10 MG tablet   Oral   Take 10 mg by mouth 2 (two) times daily.          Marland Kitchen spironolactone (ALDACTONE) 25 MG tablet   Oral   Take 25 mg by mouth every morning.         . temazepam (RESTORIL) 15 MG capsule   Oral   Take 15 mg by mouth at bedtime as needed for sleep.         . Travoprost, BAK Free, (TRAVATAN) 0.004 % SOLN ophthalmic solution   Both Eyes   Place 1 drop into both eyes at bedtime.          Marland Kitchen zolpidem (AMBIEN) 10 MG tablet   Oral   Take 10 mg by mouth at bedtime as needed for sleep.         Marland Kitchen oxyCODONE-acetaminophen (PERCOCET/ROXICET) 5-325 MG per tablet   Oral   Take 1 tablet by mouth every 6 (six) hours as needed for pain.   20 tablet   0    BP 119/76  Pulse 85  Temp(Src) 98.6 F (37 C) (Oral)  Resp 18  SpO2 100% Physical Exam  Nursing note and vitals reviewed. Constitutional:  She appears well-developed and well-nourished. No distress.  HENT:  Head: Normocephalic and atraumatic.  Eyes: Pupils are equal, round, and reactive to light.  Neck: Normal range of motion. Neck supple.  Cardiovascular: Normal rate and regular rhythm.   Pulmonary/Chest: Effort normal.  Abdominal: Soft.  Musculoskeletal:       Left shoulder: She exhibits decreased range of motion, tenderness, bony tenderness, swelling and spasm. She exhibits no effusion, no crepitus, no deformity, no laceration, no pain, normal pulse and normal strength (pts strength is symmetrical).       Arms: Neurological: She is alert.  Skin: Skin is warm and dry.    ED Course  Procedures (including critical care time) Labs Review Labs Reviewed - No data to display Imaging Review Dg Shoulder Left  03/20/2013   CLINICAL DATA:  Left shoulder and arm pain. Multiple sclerosis with multiple falls.  EXAM: LEFT SHOULDER - 2+ VIEW  COMPARISON:  11/05/2012 and 09/04/2012  FINDINGS: No fracture or dislocation. Visualized lungs clear.  IMPRESSION: No fracture.   Electronically Signed   By: Bridgett Larsson   On: 03/20/2013 14:02    MDM   1. Shoulder injury, left, initial encounter    Shoulder xray negative for abnormality. Pt shoulder appears to be bruised. Pain medication given in ED has really helped.  Will give pt small Rx for pain medication. I advised that she ice shoulder multiple times a day and follow-up with Ortho referral given.  To decrease the falls, pt plans to have home aid start coming to her house all day 6-7 times a week. Currently its 2 hours a day.  52 y.o.Shannon Garrett's evaluation in the Emergency Department is complete. It has been determined that no acute conditions requiring further emergency intervention are present at this time. The patient/guardian have been advised of the diagnosis and plan. We have discussed signs and symptoms that warrant return to the ED, such as changes or worsening in  symptoms.  Vital signs are stable at discharge. Filed Vitals:   03/20/13 1317  BP: 119/76  Pulse: 85  Temp: 98.6 F (37 C)  Resp: 18    Patient/guardian has voiced understanding and agreed to follow-up with the PCP or specialist.     Dorthula Matas, PA-C 03/20/13 1425

## 2013-05-13 ENCOUNTER — Ambulatory Visit: Payer: PRIVATE HEALTH INSURANCE | Admitting: Physician Assistant

## 2013-05-20 ENCOUNTER — Telehealth: Payer: Self-pay | Admitting: *Deleted

## 2013-05-20 ENCOUNTER — Encounter: Payer: Self-pay | Admitting: Physician Assistant

## 2013-05-20 ENCOUNTER — Ambulatory Visit (INDEPENDENT_AMBULATORY_CARE_PROVIDER_SITE_OTHER): Payer: PRIVATE HEALTH INSURANCE | Admitting: Physician Assistant

## 2013-05-20 VITALS — BP 122/64 | HR 81 | Ht 65.0 in | Wt 211.0 lb

## 2013-05-20 DIAGNOSIS — I10 Essential (primary) hypertension: Secondary | ICD-10-CM

## 2013-05-20 DIAGNOSIS — I428 Other cardiomyopathies: Secondary | ICD-10-CM

## 2013-05-20 DIAGNOSIS — I5023 Acute on chronic systolic (congestive) heart failure: Secondary | ICD-10-CM

## 2013-05-20 DIAGNOSIS — G35 Multiple sclerosis: Secondary | ICD-10-CM

## 2013-05-20 DIAGNOSIS — R0602 Shortness of breath: Secondary | ICD-10-CM

## 2013-05-20 DIAGNOSIS — I5032 Chronic diastolic (congestive) heart failure: Secondary | ICD-10-CM

## 2013-05-20 LAB — BRAIN NATRIURETIC PEPTIDE: Pro B Natriuretic peptide (BNP): 5 pg/mL (ref 0.0–100.0)

## 2013-05-20 LAB — BASIC METABOLIC PANEL
Chloride: 100 mEq/L (ref 96–112)
Creatinine, Ser: 1.2 mg/dL (ref 0.4–1.2)
GFR: 61.62 mL/min (ref >60.00)

## 2013-05-20 MED ORDER — POTASSIUM CHLORIDE CRYS ER 20 MEQ PO TBCR: 40.0000 meq | EXTENDED_RELEASE_TABLET | Freq: Two times a day (BID) | ORAL | Status: DC

## 2013-05-20 NOTE — Progress Notes (Signed)
[address] [place], [place]  [address] Phone: [phone] Fax:  [phone]  Date:  05/20/2013   ID:  Shannon Garrett, DOB 01/24/1961, MRN [id number]  PCP:  Marletta Lor, NP  Cardiologist:  Dr. Tonny Bollman     History of Present Illness: Shannon Garrett is a 52 y.o. female with a hx of multiple sclerosis, HTN and depression. She is limited by her multiple sclerosis and is "scooter-bound." Echo in 6/12 demonstrated normal LVF. She has been treated with Novantrone, Tysabri, Copaxone, Symmetrel and Dimethyl Fumerate for her MS. She was admitted 04/2012 with new onset systolic CHF. CT was negative for pulmonary embolism. Findings were suggestive of pulmonary edema. Echo 04/29/12: Mild LVH, EF 20-25%, mild AI, moderate MR, moderate LAE, mild RAE, mild RVE, moderate TR, PASP 51, small pericardial effusion. Overall it has been felt that her cardiomyopathy is likely nonischemic. She has been exposed to multiple chemotherapeutic agents in the past  which can cause CHF months to years after discontinuation. Medical therapy was recommended for CHF. Ischemic evaluation was not felt to be necessary. Her EF has improved.  Echo 3/14: mild LVH, EF 50-55%, grade 1 diastolic dysfunction, mild AI, MAC, mild LAE.   Last seen by me 11/2012.  I stopped her digoxin.  She remains significantly debilitated from her MS.  She tells me she was admitted for UTI a few mos ago.  She denies chest pain or syncope.  She notes occasional dyspnea. She has days when her LE edema is worse.  She sleeps on 1 pillow.  No PND.   Recent Labs: 11/05/2012: ALT 11; Hemoglobin 10.9*  12/15/2012: Pro B Natriuretic peptide (BNP) 15.0  12/22/2012: Creatinine 1.1; Potassium 3.5   Wt Readings from Last 3 Encounters:  05/20/13 211 lb (95.709 kg)  11/05/12 196 lb 9 oz (89.16 kg)  09/29/12 204 lb 12.8 oz (92.897 kg)     Past Medical History  Diagnosis Date  . MS (multiple sclerosis)     a. Dx'd late 20's. b. Tx with Novantrone,  Tysabri, Copaxone previously.  Marland Kitchen Hypertension   . Depression   . Glaucoma   . Cardiomyopathy     a.  Echo 04/29/12: Mild LVH, EF 20-25%, mild AI, moderate MR, moderate LAE, mild RAE, mild RVE, moderate TR, PASP 51, small pericardial effusion;   b. probably non-ischemic given multiple chemo-Tx agents used for MS and global LV dysfn on echo  . Chronic systolic heart failure   . CHF (congestive heart failure)   . Shortness of breath   . Neuromuscular disorder     MS    Current Outpatient Prescriptions  Medication Sig Dispense Refill  . amantadine (SYMMETREL) 100 MG capsule Take 100 mg by mouth 2 (two) times daily.       . carvedilol (COREG) 6.25 MG tablet Take 6.25 mg by mouth 2 (two) times daily with a meal.      . furosemide (LASIX) 40 MG tablet Take 1 tablet (40 mg total) by mouth 2 (two) times daily.  6 tablet  0  . gabapentin (NEURONTIN) 600 MG tablet Take 600 mg by mouth 3 (three) times daily.       Marland Kitchen imipramine (TOFRANIL) 50 MG tablet Take 100 mg by mouth at bedtime.      . nitrofurantoin (MACRODANTIN) 100 MG capsule Take 100 mg by mouth daily.      Marland Kitchen oxyCODONE-acetaminophen (PERCOCET/ROXICET) 5-325 MG per tablet Take 1 tablet by mouth every 6 (six) hours as needed  for pain.  20 tablet  0  . potassium chloride SA (K-DUR,KLOR-CON) 20 MEQ tablet Take 40 mEq by mouth daily.   1 tablet  0  . solifenacin (VESICARE) 10 MG tablet Take 10 mg by mouth 2 (two) times daily.       Marland Kitchen spironolactone (ALDACTONE) 25 MG tablet Take 25 mg by mouth every morning.      Marland Kitchen terconazole (TERAZOL 7) 0.4 % vaginal cream as needed.      . Travoprost, BAK Free, (TRAVATAN) 0.004 % SOLN ophthalmic solution Place 1 drop into both eyes at bedtime.       . triamcinolone cream (KENALOG) 0.1 % as needed.      . zolpidem (AMBIEN) 10 MG tablet Take 10 mg by mouth at bedtime as needed for sleep.       No current facility-administered medications for this visit.    Allergies:   Sulfonamide derivatives and Penicillins     Social History:  The patient  reports that she has never smoked. She has never used smokeless tobacco. She reports that she does not drink alcohol or use illicit drugs.   Family History:  The patient's family history includes Hypertension in her mother.   ROS:  Please see the history of present illness.   All other systems reviewed and negative.   PHYSICAL EXAM: VS:  BP 122/64  Pulse 81  Ht 5\' 5"  (1.651 m)  Wt 211 lb (95.709 kg)  BMI 35.11 kg/m2 Well nourished, well developed, in no acute distress HEENT: normal Neck: no JVD at 90 Cardiac:  normal S1, S2; RRR; no murmur Lungs:  clear to auscultation bilaterally, no wheezing, rhonchi or rales Abd: soft, nontender, no hepatomegaly Ext: trace bilateral LE edema Skin: warm and dry Neuro:  CNs 2-12 intact, no focal abnormalities noted  EKG:  NSR, HR 81, NSSTTW changes     ASSESSMENT AND PLAN:  1. Cardiomyopathy:  As noted, she has had recovery of LVF.  Continue current Rx.   2. Chronic Diastolic CHF:  Volume appears stable.  She has occasional increases in her LE edema. Her edema is likely multifactorial. She does note occasional dyspnea. I will check a basic metabolic panel and BNP today.  I advised her to take an extra Lasix on days when her edema is worse.  3. Multiple Sclerosis:  F/u with neurology. 4. Hypertension:  Controlled.  5. Disposition:  F/u with Dr. Tonny Bollman in 6 mos.    Signed, Tereso Newcomer, PA-C  05/20/2013 2:14 PM

## 2013-05-20 NOTE — Telephone Encounter (Signed)
pt  notified about lab results and to increase K+ to 40 meq bid; bmet 05/27/13; pt verbalized understanding to Plan of Care

## 2013-05-20 NOTE — Patient Instructions (Signed)
Your physician recommends that you return for lab work in: TODAY BMET, BNP   Your physician wants you to follow-up in: 6 MONTHS DR. Excell Seltzer. You will receive a reminder letter in the mail two months in advance. If you don't receive a letter, please call our office to schedule the follow-up appointment.

## 2013-05-24 ENCOUNTER — Emergency Department (HOSPITAL_COMMUNITY): Payer: PRIVATE HEALTH INSURANCE

## 2013-05-24 ENCOUNTER — Inpatient Hospital Stay (HOSPITAL_COMMUNITY)
Admission: EM | Admit: 2013-05-24 | Discharge: 2013-05-26 | DRG: 558 | Disposition: A | Discharge status: Skilled Nursing Facility | Payer: PRIVATE HEALTH INSURANCE | Attending: Internal Medicine | Admitting: Internal Medicine

## 2013-05-24 ENCOUNTER — Encounter (HOSPITAL_COMMUNITY): Payer: Self-pay | Admitting: Emergency Medicine

## 2013-05-24 DIAGNOSIS — R531 Weakness: Secondary | ICD-10-CM

## 2013-05-24 DIAGNOSIS — I10 Essential (primary) hypertension: Secondary | ICD-10-CM

## 2013-05-24 DIAGNOSIS — M6282 Rhabdomyolysis: Principal | ICD-10-CM | POA: Diagnosis present

## 2013-05-24 DIAGNOSIS — E43 Unspecified severe protein-calorie malnutrition: Secondary | ICD-10-CM

## 2013-05-24 DIAGNOSIS — R509 Fever, unspecified: Secondary | ICD-10-CM

## 2013-05-24 DIAGNOSIS — I428 Other cardiomyopathies: Secondary | ICD-10-CM | POA: Diagnosis present

## 2013-05-24 DIAGNOSIS — I110 Hypertensive heart disease with heart failure: Secondary | ICD-10-CM | POA: Diagnosis present

## 2013-05-24 DIAGNOSIS — Z7409 Other reduced mobility: Secondary | ICD-10-CM

## 2013-05-24 DIAGNOSIS — L03113 Cellulitis of right upper limb: Secondary | ICD-10-CM

## 2013-05-24 DIAGNOSIS — IMO0002 Reserved for concepts with insufficient information to code with codable children: Secondary | ICD-10-CM | POA: Diagnosis present

## 2013-05-24 DIAGNOSIS — I5032 Chronic diastolic (congestive) heart failure: Secondary | ICD-10-CM

## 2013-05-24 DIAGNOSIS — E669 Obesity, unspecified: Secondary | ICD-10-CM

## 2013-05-24 DIAGNOSIS — D539 Nutritional anemia, unspecified: Secondary | ICD-10-CM

## 2013-05-24 DIAGNOSIS — I5022 Chronic systolic (congestive) heart failure: Secondary | ICD-10-CM | POA: Diagnosis present

## 2013-05-24 DIAGNOSIS — Y92009 Unspecified place in unspecified non-institutional (private) residence as the place of occurrence of the external cause: Secondary | ICD-10-CM

## 2013-05-24 DIAGNOSIS — D649 Anemia, unspecified: Secondary | ICD-10-CM | POA: Diagnosis present

## 2013-05-24 DIAGNOSIS — I509 Heart failure, unspecified: Secondary | ICD-10-CM | POA: Diagnosis present

## 2013-05-24 DIAGNOSIS — W19XXXA Unspecified fall, initial encounter: Secondary | ICD-10-CM

## 2013-05-24 DIAGNOSIS — F3289 Other specified depressive episodes: Secondary | ICD-10-CM | POA: Diagnosis present

## 2013-05-24 DIAGNOSIS — I5023 Acute on chronic systolic (congestive) heart failure: Secondary | ICD-10-CM

## 2013-05-24 DIAGNOSIS — S8011XA Contusion of right lower leg, initial encounter: Secondary | ICD-10-CM

## 2013-05-24 DIAGNOSIS — R5381 Other malaise: Secondary | ICD-10-CM | POA: Diagnosis present

## 2013-05-24 DIAGNOSIS — G8220 Paraplegia, unspecified: Secondary | ICD-10-CM

## 2013-05-24 DIAGNOSIS — F329 Major depressive disorder, single episode, unspecified: Secondary | ICD-10-CM | POA: Diagnosis present

## 2013-05-24 DIAGNOSIS — J9600 Acute respiratory failure, unspecified whether with hypoxia or hypercapnia: Secondary | ICD-10-CM

## 2013-05-24 DIAGNOSIS — G35 Multiple sclerosis: Secondary | ICD-10-CM | POA: Diagnosis present

## 2013-05-24 DIAGNOSIS — W010XXA Fall on same level from slipping, tripping and stumbling without subsequent striking against object, initial encounter: Secondary | ICD-10-CM | POA: Diagnosis present

## 2013-05-24 DIAGNOSIS — M79609 Pain in unspecified limb: Secondary | ICD-10-CM | POA: Diagnosis present

## 2013-05-24 DIAGNOSIS — L259 Unspecified contact dermatitis, unspecified cause: Secondary | ICD-10-CM

## 2013-05-24 DIAGNOSIS — R112 Nausea with vomiting, unspecified: Secondary | ICD-10-CM | POA: Diagnosis present

## 2013-05-24 DIAGNOSIS — Z6835 Body mass index (BMI) 35.0-35.9, adult: Secondary | ICD-10-CM

## 2013-05-24 DIAGNOSIS — H409 Unspecified glaucoma: Secondary | ICD-10-CM | POA: Diagnosis present

## 2013-05-24 DIAGNOSIS — Z8249 Family history of ischemic heart disease and other diseases of the circulatory system: Secondary | ICD-10-CM

## 2013-05-24 DIAGNOSIS — R748 Abnormal levels of other serum enzymes: Secondary | ICD-10-CM | POA: Diagnosis present

## 2013-05-24 DIAGNOSIS — G35D Multiple sclerosis, unspecified: Secondary | ICD-10-CM | POA: Diagnosis present

## 2013-05-24 LAB — COMPREHENSIVE METABOLIC PANEL
BUN: 18 mg/dL (ref 6–23)
CO2: 32 mEq/L (ref 19–32)
Chloride: 100 mEq/L (ref 96–112)
Creatinine, Ser: 0.93 mg/dL (ref 0.50–1.10)
GFR calc Af Amer: 82 mL/min — ABNORMAL LOW (ref >90)
GFR calc non Af Amer: 70 mL/min — ABNORMAL LOW (ref >90)
Glucose, Bld: 135 mg/dL — ABNORMAL HIGH (ref 70–99)
Potassium: 4 mEq/L (ref 3.5–5.1)
Total Bilirubin: 0.5 mg/dL (ref 0.3–1.2)

## 2013-05-24 LAB — URINALYSIS, ROUTINE W REFLEX MICROSCOPIC
Bilirubin Urine: NEGATIVE
Glucose, UA: NEGATIVE mg/dL
Ketones, ur: NEGATIVE mg/dL
Nitrite: NEGATIVE
Protein, ur: NEGATIVE mg/dL
pH: 5.5 (ref 5.0–8.0)

## 2013-05-24 LAB — CBC
Hemoglobin: 11.9 g/dL — ABNORMAL LOW (ref 12.0–15.0)
MCH: 28.1 pg (ref 26.0–34.0)
Platelets: 157 10*3/uL (ref 150–400)
RBC: 4.23 MIL/uL (ref 3.87–5.11)

## 2013-05-24 LAB — LIPASE, BLOOD: Lipase: 17 U/L (ref 11–59)

## 2013-05-24 LAB — POCT I-STAT TROPONIN I

## 2013-05-24 LAB — URINE MICROSCOPIC-ADD ON

## 2013-05-24 MED ORDER — MORPHINE SULFATE 4 MG/ML IJ SOLN
4.0000 mg | Freq: Once | INTRAMUSCULAR | Status: AC
  Administered 2013-05-24: 4 mg via INTRAVENOUS
  Filled 2013-05-24: qty 1

## 2013-05-24 NOTE — ED Notes (Signed)
Bed: JX91 Expected date:  Expected time:  Means of arrival:  Comments: Bed 22, EMS, 52 F, Fall/Weakness/Emesis/Fever
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
## 2013-05-24 NOTE — ED Notes (Signed)
Pt called EMS d/t nausea, weakness and body aches.
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
## 2013-05-25 ENCOUNTER — Inpatient Hospital Stay (HOSPITAL_COMMUNITY): Payer: PRIVATE HEALTH INSURANCE

## 2013-05-25 ENCOUNTER — Encounter (HOSPITAL_COMMUNITY): Payer: Self-pay | Admitting: Internal Medicine

## 2013-05-25 DIAGNOSIS — W19XXXA Unspecified fall, initial encounter: Secondary | ICD-10-CM

## 2013-05-25 DIAGNOSIS — I5022 Chronic systolic (congestive) heart failure: Secondary | ICD-10-CM

## 2013-05-25 DIAGNOSIS — M6282 Rhabdomyolysis: Principal | ICD-10-CM

## 2013-05-25 LAB — COMPREHENSIVE METABOLIC PANEL
AST: 39 U/L — ABNORMAL HIGH (ref 0–37)
Albumin: 3.1 g/dL — ABNORMAL LOW (ref 3.5–5.2)
Alkaline Phosphatase: 108 U/L (ref 39–117)
BUN: 16 mg/dL (ref 6–23)
Calcium: 8.8 mg/dL (ref 8.4–10.5)
Chloride: 100 mEq/L (ref 96–112)
GFR calc Af Amer: 90 mL/min — ABNORMAL LOW (ref >90)
GFR calc non Af Amer: 77 mL/min — ABNORMAL LOW (ref >90)
Glucose, Bld: 132 mg/dL — ABNORMAL HIGH (ref 70–99)
Potassium: 2.6 mEq/L — CL (ref 3.5–5.1)
Sodium: 140 mEq/L (ref 135–145)
Total Protein: 6.2 g/dL (ref 6.0–8.3)

## 2013-05-25 LAB — CBC WITH DIFFERENTIAL/PLATELET
Basophils Absolute: 0 10*3/uL (ref 0.0–0.1)
Basophils Relative: 1 % (ref 0–1)
Eosinophils Relative: 7 % — ABNORMAL HIGH (ref 0–5)
HCT: 32.2 % — ABNORMAL LOW (ref 36.0–46.0)
Hemoglobin: 11 g/dL — ABNORMAL LOW (ref 12.0–15.0)
Lymphocytes Relative: 24 % (ref 12–46)
Lymphs Abs: 2.1 10*3/uL (ref 0.7–4.0)
MCV: 81.3 fL (ref 78.0–100.0)
Monocytes Absolute: 0.6 10*3/uL (ref 0.1–1.0)
Monocytes Relative: 8 % (ref 3–12)
Neutro Abs: 5.3 10*3/uL (ref 1.7–7.7)
RDW: 14.6 % (ref 11.5–15.5)
WBC: 8.6 10*3/uL (ref 4.0–10.5)

## 2013-05-25 LAB — MAGNESIUM: Magnesium: 1.8 mg/dL (ref 1.5–2.5)

## 2013-05-25 MED ORDER — ONDANSETRON HCL 4 MG PO TABS: 4.0000 mg | ORAL_TABLET | Freq: Four times a day (QID) | ORAL | Status: DC | PRN

## 2013-05-25 MED ORDER — OXYCODONE-ACETAMINOPHEN 5-325 MG PO TABS
1.0000 | ORAL_TABLET | Freq: Four times a day (QID) | ORAL | Status: DC | PRN
  Administered 2013-05-25 – 2013-05-26 (×3): 1 via ORAL
  Filled 2013-05-25 (×3): qty 1

## 2013-05-25 MED ORDER — ACETAMINOPHEN 650 MG RE SUPP: 650.0000 mg | Freq: Four times a day (QID) | RECTAL | Status: DC | PRN

## 2013-05-25 MED ORDER — IMIPRAMINE HCL 50 MG PO TABS
100.0000 mg | ORAL_TABLET | Freq: Every day | ORAL | Status: DC
  Administered 2013-05-25 (×2): 100 mg via ORAL
  Filled 2013-05-25 (×3): qty 2

## 2013-05-25 MED ORDER — SODIUM CHLORIDE 0.9 % IV BOLUS (SEPSIS)
1000.0000 mL | Freq: Once | INTRAVENOUS | Status: AC
  Administered 2013-05-25: 1000 mL via INTRAVENOUS

## 2013-05-25 MED ORDER — NITROFURANTOIN MACROCRYSTAL 100 MG PO CAPS
100.0000 mg | ORAL_CAPSULE | Freq: Every day | ORAL | Status: DC
  Administered 2013-05-25 – 2013-05-26 (×2): 100 mg via ORAL
  Filled 2013-05-25 (×2): qty 1

## 2013-05-25 MED ORDER — AMANTADINE HCL 100 MG PO CAPS
100.0000 mg | ORAL_CAPSULE | Freq: Two times a day (BID) | ORAL | Status: DC
  Administered 2013-05-25 – 2013-05-26 (×3): 100 mg via ORAL
  Filled 2013-05-25 (×4): qty 1

## 2013-05-25 MED ORDER — POTASSIUM CHLORIDE 10 MEQ/100ML IV SOLN
10.0000 meq | INTRAVENOUS | Status: AC
  Administered 2013-05-25 (×4): 10 meq via INTRAVENOUS
  Filled 2013-05-25 (×4): qty 100

## 2013-05-25 MED ORDER — CARVEDILOL 6.25 MG PO TABS
6.2500 mg | ORAL_TABLET | Freq: Two times a day (BID) | ORAL | Status: DC
  Administered 2013-05-25 – 2013-05-26 (×3): 6.25 mg via ORAL
  Filled 2013-05-25 (×5): qty 1

## 2013-05-25 MED ORDER — IOHEXOL 300 MG/ML  SOLN
50.0000 mL | Freq: Once | INTRAMUSCULAR | Status: AC | PRN
  Administered 2013-05-25: 50 mL via ORAL

## 2013-05-25 MED ORDER — DARIFENACIN HYDROBROMIDE ER 7.5 MG PO TB24
7.5000 mg | ORAL_TABLET | Freq: Every day | ORAL | Status: DC
  Administered 2013-05-25 – 2013-05-26 (×2): 7.5 mg via ORAL
  Filled 2013-05-25 (×2): qty 1

## 2013-05-25 MED ORDER — TRIAMCINOLONE ACETONIDE 0.1 % EX CREA
1.0000 "application " | TOPICAL_CREAM | CUTANEOUS | Status: DC | PRN
  Filled 2013-05-25: qty 15

## 2013-05-25 MED ORDER — LATANOPROST 0.005 % OP SOLN
1.0000 [drp] | Freq: Every day | OPHTHALMIC | Status: DC
  Administered 2013-05-25: 1 [drp] via OPHTHALMIC
  Filled 2013-05-25 (×2): qty 2.5

## 2013-05-25 MED ORDER — ACETAMINOPHEN 325 MG PO TABS
650.0000 mg | ORAL_TABLET | Freq: Four times a day (QID) | ORAL | Status: DC | PRN
Start: 2013-05-25 — End: 2013-05-26

## 2013-05-25 MED ORDER — ZOLPIDEM TARTRATE 10 MG PO TABS: 10.0000 mg | ORAL_TABLET | Freq: Every evening | ORAL | Status: DC | PRN

## 2013-05-25 MED ORDER — HYDROCODONE-ACETAMINOPHEN 5-325 MG PO TABS: 1.0000 | ORAL_TABLET | ORAL | Status: DC | PRN

## 2013-05-25 MED ORDER — ADULT MULTIVITAMIN W/MINERALS CH
1.0000 | ORAL_TABLET | Freq: Every day | ORAL | Status: DC
  Administered 2013-05-25 – 2013-05-26 (×2): 1 via ORAL
  Filled 2013-05-25 (×2): qty 1

## 2013-05-25 MED ORDER — HYDROMORPHONE HCL PF 1 MG/ML IJ SOLN
0.5000 mg | INTRAMUSCULAR | Status: DC | PRN
  Administered 2013-05-25 (×2): 0.5 mg via INTRAVENOUS
  Filled 2013-05-25 (×3): qty 1

## 2013-05-25 MED ORDER — ONDANSETRON HCL 4 MG/2ML IJ SOLN
4.0000 mg | Freq: Four times a day (QID) | INTRAMUSCULAR | Status: DC | PRN
  Administered 2013-05-25: 4 mg via INTRAVENOUS
  Filled 2013-05-25 (×2): qty 2

## 2013-05-25 MED ORDER — ENSURE COMPLETE PO LIQD
237.0000 mL | Freq: Two times a day (BID) | ORAL | Status: DC
  Administered 2013-05-25 – 2013-05-26 (×2): 237 mL via ORAL

## 2013-05-25 MED ORDER — SODIUM CHLORIDE 0.9 % IV SOLN
INTRAVENOUS | Status: AC
  Administered 2013-05-25: 700 mL via INTRAVENOUS

## 2013-05-25 MED ORDER — ZOLPIDEM TARTRATE 5 MG PO TABS: 5.0000 mg | ORAL_TABLET | Freq: Every evening | ORAL | Status: DC | PRN

## 2013-05-25 NOTE — Evaluation (Signed)
Physical Therapy Evaluation Patient Details Name: Shannon Garrett MRN: [id number] DOB: 1960-11-08 Today's Date: 05/25/2013 Time: 7829-5621 PT Time Calculation (min): 13 min  PT Assessment / Plan / Recommendation History of Present Illness  52 y.o. female with known history of multiple sclerosis, nonischemic cardiomyopathy and chronic anemia was brought to the ER after patient's home health aide found patient to be on the floor at her house. Patient states that night before this patient was trying to walk when she suddenly slipped and fell in front of her scooter and was lost in between the scooter and the wall and was unable to raise. She was in that position until the home health aide saw her next day. Patient states she did not head or lose consciousness.  Clinical Impression  Pt admitted with rhabdomyolysis.  Pt currently with functional limitations due to the deficits listed below (see PT Problem List).  Pt will benefit from skilled PT to increase their independence and safety with mobility to allow discharge to the venue listed below.  Pt required +2 assist for bed mobility today and too weak to stand.  Recommend nsg staff use lift if getting pt OOB.     PT Assessment  Patient needs continued PT services    Follow Up Recommendations  Supervision/Assistance - 24 hour;SNF    Does the patient have the potential to tolerate intense rehabilitation      Barriers to Discharge        Equipment Recommendations  None recommended by PT    Recommendations for Other Services     Frequency Min 3X/week    Precautions / Restrictions Precautions Precautions: Fall   Pertinent Vitals/Pain Pt reports increase in R LE pain with mobility however states RN aware of request for pain meds      Mobility  Bed Mobility Bed Mobility: Supine to Sit;Sit to Supine Supine to Sit: 1: +2 Total assist Supine to Sit: Patient Percentage: 0% Sit to Supine: 1: +2 Total assist Sit to Supine: Patient  Percentage: 0% Details for Bed Mobility Assistance: verbal cues and increased time given for pt to assist however pt unable and required +2 Transfers Transfers: Not assessed Details for Transfer Assistance: discussed pt being too weak to attempt however she wished to try so knees blocked to attempt standing however pt unable so returned to supine Ambulation/Gait Ambulation/Gait Assistance: Not tested (comment)    Exercises     PT Diagnosis: Generalized weakness;Difficulty walking  PT Problem List: Decreased strength;Decreased balance;Decreased mobility;Decreased safety awareness PT Treatment Interventions: DME instruction;Gait training;Functional mobility training;Therapeutic activities;Therapeutic exercise;Wheelchair mobility training;Patient/family education;Neuromuscular re-education;Balance training     PT Goals(Current goals can be found in the care plan section) Acute Rehab PT Goals PT Goal Formulation: With patient Time For Goal Achievement: 06/08/13 Potential to Achieve Goals: Fair  Visit Information  Last PT Received On: 05/25/13 Assistance Needed: +2 History of Present Illness: 52 y.o. female with known history of multiple sclerosis, nonischemic cardiomyopathy and chronic anemia was brought to the ER after patient's home health aide found patient to be on the floor at her house. Patient states that night before this patient was trying to walk when she suddenly slipped and fell in front of her scooter and was lost in between the scooter and the wall and was unable to raise. She was in that position until the home health aide saw her next day. Patient states she did not head or lose consciousness.       Prior Functioning  Home Living Family/patient  expects to be discharged to:: Private residence Living Arrangements: Alone Available Help at Discharge: Home health Type of Home: House Home Access: Level entry Home Layout: One level Home Equipment: Environmental consultant - 2 wheels;Electric  scooter;Bedside commode;Tub bench Prior Function Level of Independence: Needs assistance Gait / Transfers Assistance Needed: pt states she is able to take a couple steps with RW at baseline, uses electric scooter for mobility ADL's / Homemaking Assistance Needed: pt reports assist required  Communication Communication: No difficulties    Cognition  Cognition Arousal/Alertness: Awake/alert Behavior During Therapy: WFL for tasks assessed/performed Overall Cognitive Status: Within Functional Limits for tasks assessed    Extremity/Trunk Assessment Lower Extremity Assessment Lower Extremity Assessment: RLE deficits/detail;LLE deficits/detail RLE Deficits / Details: able to perform ankle pumps otherwise unable to move against gravity requiring assist, pt reports bruising on R hip from fall at home LLE Deficits / Details: no active movement of L LE (except ankle pumps) observed during session, pt states this is her weaker leg however she is usually able to move it   Balance Balance Balance Assessed: Yes Static Sitting Balance Static Sitting - Balance Support: Bilateral upper extremity supported Static Sitting - Level of Assistance: 2: Max assist;4: Min assist Static Sitting - Comment/# of Minutes: increased assist sitting EOB initially however progressed to min with cues for posture, pt reports weak trunk musculature at baseline  End of Session PT - End of Session Activity Tolerance: Patient tolerated treatment well Patient left: in bed;with call bell/phone within reach;with bed alarm set Nurse Communication: Need for lift equipment  GP     Weslee Prestage,KATHrine E 05/25/2013, 4:50 PM Zenovia Jarred, PT, DPT 05/25/2013 Pager: [phone]

## 2013-05-25 NOTE — Clinical Social Work Psychosocial (Signed)
      Clinical Social Work Department BRIEF PSYCHOSOCIAL ASSESSMENT 05/25/2013  Patient:  Shannon Garrett, Shannon Garrett     Account Number:  [id number]     Admit date:  05/24/2013  Clinical Social Worker:  Hattie Perch  Date/Time:  05/25/2013 12:00 M  Referred by:  Physician  Date Referred:  05/25/2013 Referred for  SNF Placement   Other Referral:   Interview type:  Patient Other interview type:    PSYCHOSOCIAL DATA Living Status:  FAMILY Admitted from facility:   Level of care:   Primary support name:  Gwenith Spitz Primary support relationship to patient:  FAMILY Degree of support available:   fair    CURRENT CONCERNS Current Concerns  Post-Acute Placement   Other Concerns:    SOCIAL WORK ASSESSMENT / PLAN CSW met with patient. patient is alert and oriented X3. patient in need of snf placement. patient reports that she has previously been at rehab at [facility] and had a bad experience there. she states that she has heard that [facility] is nice and hopes she can get a bed there. she is hopeful that this rehab experience will be better this time.   Assessment/plan status:   Other assessment/ plan:   Information/referral to community resources:    PATIENTS/FAMILYS RESPONSE TO PLAN OF CARE: patient is disappointed in the fact that she needs rehab but understands that it is currently necessary. she is agreeable to being faxed out and providing bed offers.

## 2013-05-25 NOTE — H&P (Addendum)
[facility] History and Physical  Shannon Garrett ZOX:[id number] DOB: 11-Sep-1960 DOA: 05/24/2013  Referring physician: ER physician. PCP: Marletta Lor, NP   Chief Complaint: Fall.  HPI: Shannon Garrett is a 52 y.o. female with known history of multiple sclerosis, nonischemic cardiomyopathy and chronic anemia was brought to the ER after patient's home health aide found patient to be on the floor at her house. Patient states that night before this patient was trying to walk when she suddenly slipped and fell in front of her scooter and was lost in between the scooter and the wall and was unable to raise. She was in that position until the home health aide saw her next day. Patient states she did not head or lose consciousness. In the ER patient was found to have significant tenderness in the right thigh area with some skin blistering and swelling. Patient is able to move her hips without pain. In addition patient has been having some nausea and patient states that earlier today she did vomit once and she has some generalized abdominal discomfort. Patient otherwise denies any chest pain or shortness of breath. In the ER labs reveal elevated CK levels. Patient has been started IV fluids and admitted for further management.   Review of Systems: As presented in the history of presenting illness, rest negative.  Past Medical History  Diagnosis Date  . MS (multiple sclerosis)     a. Dx'd late 20's. b. Tx with Novantrone, Tysabri, Copaxone previously.  Marland Kitchen Hypertension   . Depression   . Glaucoma   . Cardiomyopathy     a.  Echo 04/29/12: Mild LVH, EF 20-25%, mild AI, moderate MR, moderate LAE, mild RAE, mild RVE, moderate TR, PASP 51, small pericardial effusion;   b. probably non-ischemic given multiple chemo-Tx agents used for MS and global LV dysfn on echo  . Chronic systolic heart failure   . CHF (congestive heart failure)   . Shortness of breath   . Neuromuscular disorder     MS   Past  Surgical History  Procedure Laterality Date  . Ablation      uterine  . Cesarean section     Social History:  reports that she has never smoked. She has never used smokeless tobacco. She reports that she does not drink alcohol or use illicit drugs. Where does patient live home. Can patient participate in ADLs? Not sure.  Allergies  Allergen Reactions  . Sulfonamide Derivatives Shortness Of Breath  . Penicillins Hives and Itching    Family History:  Family History  Problem Relation Age of Onset  . Hypertension Mother       Prior to Admission medications   Medication Sig Start Date End Date Taking? Authorizing Provider  amantadine (SYMMETREL) 100 MG capsule Take 100 mg by mouth 2 (two) times daily.    Yes Historical Provider, MD  carvedilol (COREG) 6.25 MG tablet Take 6.25 mg by mouth 2 (two) times daily with a meal.   Yes Historical Provider, MD  furosemide (LASIX) 40 MG tablet Take 1 tablet (40 mg total) by mouth 2 (two) times daily. 11/05/12  Yes Jennifer L Piepenbrink, PA-C  gabapentin (NEURONTIN) 600 MG tablet Take 600 mg by mouth 3 (three) times daily.    Yes Historical Provider, MD  imipramine (TOFRANIL) 50 MG tablet Take 100 mg by mouth at bedtime.   Yes Historical Provider, MD  Multiple Vitamin (MULTIVITAMIN WITH MINERALS) TABS tablet Take 1 tablet by mouth daily.   Yes Historical Provider, MD  nitrofurantoin (MACRODANTIN) 100 MG capsule Take 100 mg by mouth daily.   Yes Historical Provider, MD  oxyCODONE-acetaminophen (PERCOCET/ROXICET) 5-325 MG per tablet Take 1 tablet by mouth every 6 (six) hours as needed for pain. 03/20/13  Yes Tiffany Irine Seal, PA-C  potassium chloride SA (K-DUR,KLOR-CON) 20 MEQ tablet Take 2 tablets (40 mEq total) by mouth 2 (two) times daily. 05/20/13  Yes Beatrice Lecher, PA-C  solifenacin (VESICARE) 10 MG tablet Take 10 mg by mouth 2 (two) times daily.    Yes Historical Provider, MD  spironolactone (ALDACTONE) 25 MG tablet Take 25 mg by mouth every  morning.   Yes Historical Provider, MD  terconazole (TERAZOL 7) 0.4 % vaginal cream Place 1 applicator vaginally as needed (yeast infection).  04/28/13  Yes Historical Provider, MD  Travoprost, BAK Free, (TRAVATAN) 0.004 % SOLN ophthalmic solution Place 1 drop into both eyes at bedtime.    Yes Historical Provider, MD  triamcinolone cream (KENALOG) 0.1 % Apply 1 application topically as needed (inflammation, itching).  04/28/13  Yes Historical Provider, MD  zolpidem (AMBIEN) 10 MG tablet Take 10 mg by mouth at bedtime as needed for sleep.   Yes Historical Provider, MD  HYDROcodone-acetaminophen (NORCO/VICODIN) 5-325 MG per tablet Take 1-2 tablets by mouth every 4 (four) hours as needed. 05/25/13   Trevor Mace, PA-C    Physical Exam: Filed Vitals:   05/24/13 2056  BP: 142/74  Pulse: 102  Temp: 98.4 F (36.9 C)  TempSrc: Oral  Resp: 17  SpO2: 100%     General:  Well-developed well-nourished.  Eyes: Anicteric no pallor.  ENT: No discharge from ears eyes nose mouth.  Neck: No mass felt.  Cardiovascular: S1-S2 heard.  Respiratory: No rhonchi or crepitations.  Abdomen: Soft nontender bowel sounds present.  Skin: Skin blistering in the right anterior thigh.  Musculoskeletal: Mild swelling of the right anterior thigh with tenderness.  Psychiatric: Appears normal.  Neurologic: Alert awake oriented to time place and person. Weakness of the lower extremities.  Labs on Admission:  Basic Metabolic Panel:  Recent Labs Lab 05/20/13 1430 05/24/13 2300  NA 140 141  K 2.9* 4.0  CL 100 100  CO2 34* 32  GLUCOSE 98 135*  BUN 19 18  CREATININE 1.2 0.93  CALCIUM 9.2 9.0   Liver Function Tests:  Recent Labs Lab 05/24/13 2300  AST 66*  ALT 28  ALKPHOS 110  BILITOT 0.5  PROT 7.0  ALBUMIN 3.6    Recent Labs Lab 05/24/13 2300  LIPASE 17   No results found for this basename: AMMONIA,  in the last 168 hours CBC:  Recent Labs Lab 05/24/13 2215  WBC 8.8  HGB  11.9*  HCT 34.5*  MCV 81.6  PLT 157   Cardiac Enzymes:  Recent Labs Lab 05/24/13 2300  CKTOTAL 2512*    BNP (last 3 results)  Recent Labs  12/15/12 1639 05/20/13 1430 05/24/13 2215  PROBNP 15.0 5.0 296.7*   CBG: No results found for this basename: GLUCAP,  in the last 168 hours  Radiological Exams on Admission: Dg Chest 2 View  05/24/2013   CLINICAL DATA:  Weakness nausea and hyper 10  EXAM: CHEST  2 VIEW  COMPARISON:  11/05/2012  FINDINGS: Lateral radiograph is of limited utility due to superimposition of the patient's arms. Heart size is upper normal. Vascular pattern is normal. Lungs are clear.  IMPRESSION: Limited study, appears normal.   Electronically Signed   By: Esperanza Heir M.D.   On:  05/24/2013 21:56   Dg Femur Right  05/24/2013   CLINICAL DATA:  Fall.  Right femur pain.  Palpable hematoma.  EXAM: RIGHT FEMUR - 2 VIEW  COMPARISON:  None.  FINDINGS: There is no evidence of fracture or other focal bone lesions. Mild soft tissue swelling is seen involving the proximal thigh.  IMPRESSION: Proximal thigh soft tissue swelling.  No evidence of fracture.   Electronically Signed   By: Myles Rosenthal M.D.   On: 05/24/2013 23:08    EKG: Independently reviewed. Normal sinus rhythm.  Assessment/Plan Principal Problem:   Rhabdomyolysis Active Problems:   MULTIPLE SCLEROSIS, PROGRESSIVE/RELAPSING   HYPERTENSION, BENIGN ESSENTIAL   Fall   1. Rhabdomyolysis with right thigh pain status post fall - at this time patient has been placed on normal saline. Continue with pain relief medications. Closely follow CK levels along with metabolic panel. Closely observe for any development of compartment syndrome. Patient is has good sensation and is able to move legs without pain. X-ray of the pelvis is pending. PT consult. If pain does not improve may consider MRI left thigh. 2. Nausea vomiting with abdominal discomfort - patient had one episode of vomiting and states that she has  abdominal discomfort. AST is mildly elevated. Check CT abdomen. 3. Nonischemic cardiomyopathy - presently holding off diuretics for gentle hydration for rhabdomyolysis. Closely follow intake output and respiratory status. 4. Chronic anemia - closely follow CBC. 5. History of multiple sclerosis - continue present medications.    Code Status: Full code.  Family Communication: None.  Disposition Plan: Admit to inpatient.    Ozan Maclay N. [facility] Pager [phone].  If 7PM-7AM, please contact night-coverage [email or web address] Password [email or web address] 05/25/2013, 1:50 AM

## 2013-05-25 NOTE — Progress Notes (Signed)
Patient admitted earlier today for rhabdomyolysis after being on the ground for >12 hours after she fell and was unable to stand. Has a h/o multiple sclerosis. Was found the next day by her home health aide. Patient is reluctant about SNF, but understands that she needs it. Awaiting PT/OT evaluations, but per RN assessment requires 2 people to even get her to the commode. Continue IVF at 100 cc/hr today. Has some blisters over the right thigh. No clinical signs of infection. Continue to monitor. Recheck CPK levels in am.  Peggye Pitt, MD [facility] Pager: [phone]

## 2013-05-25 NOTE — Progress Notes (Signed)
INITIAL NUTRITION ASSESSMENT  DOCUMENTATION CODES Per approved criteria  -Obesity Unspecified   INTERVENTION: - Antiemetics per MD - Encouraged increased PO intake as nausea resolves - Ensure Complete BID - Will continue to monitor   NUTRITION DIAGNOSIS: Inadequate oral intake related to nausea as evidenced by 0% meal intake.   Goal: 1. Resolution of nausea 2. Pt to consume >90% of meals/supplements  Monitor:  Weights, labs, intake, nausea  Reason for Assessment: MST  52 y.o. female  Admitting Dx: Rhabdomyolysis  ASSESSMENT: Pt is a 53 y.o. female with known history of multiple sclerosis, nonischemic cardiomyopathy and chronic anemia was brought to the ER after patient's home health aide found patient to be on the floor at her house.   Met with pt who reports eating good PTA, doesn't know how many meals she was eating, but reports she follows a low sodium diet r/t heart failure. C/o nausea today, had 1 episode of emesis yesterday. Pt's weight up 2 pounds from earlier this month. Had not ordered anything to eat yet today.   Potassium low, getting IV replacement and daily multivitamin  AST elevated slightly   Height: Ht Readings from Last 1 Encounters:  05/20/13 5\' 5"  (1.651 m)    Weight: Wt Readings from Last 1 Encounters:  05/25/13 213 lb (96.616 kg)    Ideal Body Weight: 125 lb   % Ideal Body Weight: 170%  Wt Readings from Last 10 Encounters:  05/25/13 213 lb (96.616 kg)  05/20/13 211 lb (95.709 kg)  11/05/12 196 lb 9 oz (89.16 kg)  09/29/12 204 lb 12.8 oz (92.897 kg)  09/09/12 203 lb 0.7 oz (92.1 kg)  09/08/12 214 lb 8.1 oz (97.3 kg)  08/27/12 205 lb 4 oz (93.101 kg)  08/11/12 203 lb 4 oz (92.194 kg)  07/10/12 208 lb (94.348 kg)  06/10/12 200 lb (90.719 kg)    Usual Body Weight: 211 lb earlier this month  % Usual Body Weight: 101%  BMI:  Body mass index is 35.44 kg/(m^2). Class II obesity  Estimated Nutritional Needs: Kcal: 1600-1700 Protein:  60-70g Fluid: 1.6-1.7L/day  Skin: intact   Diet Order: Cardiac  EDUCATION NEEDS: -No education needs identified at this time   Intake/Output Summary (Last 24 hours) at 05/25/13 1100 Last data filed at 05/25/13 0700  Gross per 24 hour  Intake 638.33 ml  Output      0 ml  Net 638.33 ml    Last BM: 11/24  Labs:   Recent Labs Lab 05/20/13 1430 05/24/13 2300 05/25/13 0510  NA 140 141 140  K 2.9* 4.0 2.6*  CL 100 100 100  CO2 34* 32 30  BUN 19 18 16   CREATININE 1.2 0.93 0.86  CALCIUM 9.2 9.0 8.8  MG  --   --  1.8  GLUCOSE 98 135* 132*    CBG (last 3)  No results found for this basename: GLUCAP,  in the last 72 hours  Scheduled Meds: . amantadine  100 mg Oral BID  . carvedilol  6.25 mg Oral BID WC  . darifenacin  7.5 mg Oral Daily  . imipramine  100 mg Oral QHS  . latanoprost  1 drop Both Eyes QHS  . multivitamin with minerals  1 tablet Oral Daily  . nitrofurantoin  100 mg Oral Daily    Continuous Infusions: . sodium chloride 700 mL (05/25/13 0301)    Past Medical History  Diagnosis Date  . MS (multiple sclerosis)     a. Dx'd late 20's. b.  Tx with Novantrone, Tysabri, Copaxone previously.  Marland Kitchen Hypertension   . Depression   . Glaucoma   . Cardiomyopathy     a.  Echo 04/29/12: Mild LVH, EF 20-25%, mild AI, moderate MR, moderate LAE, mild RAE, mild RVE, moderate TR, PASP 51, small pericardial effusion;   b. probably non-ischemic given multiple chemo-Tx agents used for MS and global LV dysfn on echo  . Chronic systolic heart failure   . CHF (congestive heart failure)   . Shortness of breath   . Neuromuscular disorder     MS    Past Surgical History  Procedure Laterality Date  . Ablation      uterine  . Cesarean section      Levon Hedger MS, RD, LDN [phone] Pager [phone] After Hours Pager

## 2013-05-25 NOTE — Progress Notes (Signed)
Attempted to weight patient with scales. Patient is unable to stand with assist at this time. Will transfer to wgt bed asap.

## 2013-05-26 DIAGNOSIS — J9600 Acute respiratory failure, unspecified whether with hypoxia or hypercapnia: Secondary | ICD-10-CM

## 2013-05-26 DIAGNOSIS — IMO0002 Reserved for concepts with insufficient information to code with codable children: Secondary | ICD-10-CM

## 2013-05-26 DIAGNOSIS — I5023 Acute on chronic systolic (congestive) heart failure: Secondary | ICD-10-CM

## 2013-05-26 DIAGNOSIS — D539 Nutritional anemia, unspecified: Secondary | ICD-10-CM

## 2013-05-26 DIAGNOSIS — R5381 Other malaise: Secondary | ICD-10-CM

## 2013-05-26 LAB — CBC
Hemoglobin: 9.5 g/dL — ABNORMAL LOW (ref 12.0–15.0)
MCH: 28.1 pg (ref 26.0–34.0)
MCV: 82.5 fL (ref 78.0–100.0)
RBC: 3.38 MIL/uL — ABNORMAL LOW (ref 3.87–5.11)

## 2013-05-26 LAB — BASIC METABOLIC PANEL
BUN: 14 mg/dL (ref 6–23)
CO2: 29 mEq/L (ref 19–32)
Calcium: 8.4 mg/dL (ref 8.4–10.5)
GFR calc Af Amer: 77 mL/min — ABNORMAL LOW (ref >90)
Glucose, Bld: 103 mg/dL — ABNORMAL HIGH (ref 70–99)
Potassium: 2.9 mEq/L — ABNORMAL LOW (ref 3.5–5.1)
Sodium: 141 mEq/L (ref 135–145)

## 2013-05-26 LAB — CK: Total CK: 1681 U/L — ABNORMAL HIGH (ref 7–177)

## 2013-05-26 MED ORDER — OXYCODONE-ACETAMINOPHEN 5-325 MG PO TABS: 1.0000 | ORAL_TABLET | Freq: Four times a day (QID) | ORAL | Status: DC | PRN

## 2013-05-26 NOTE — Discharge Summary (Signed)
Physician Discharge Summary  Shannon Garrett ZOX:[id number] DOB: 01/23/1961 DOA: 05/24/2013  PCP: Shannon Lor, NP  Admit date: 05/24/2013 Discharge date: 05/26/2013  Time spent: 45 minutes  Recommendations for Outpatient Follow-up:  -Will be discharged to SNF today for ST rehab.   Discharge Diagnoses:  Principal Problem:   Rhabdomyolysis Active Problems:   MULTIPLE SCLEROSIS, PROGRESSIVE/RELAPSING   HYPERTENSION, BENIGN ESSENTIAL   Fall   Discharge Condition: Stable and improved  Filed Weights   05/25/13 0700  Weight: 96.616 kg (213 lb)    History of present illness:  Shannon Garrett is a 52 y.o. female with known history of multiple sclerosis, nonischemic cardiomyopathy and chronic anemia was brought to the ER after patient's home health aide found patient to be on the floor at her house. Patient states that night before this patient was trying to walk when she suddenly slipped and fell in front of her scooter and was lost in between the scooter and the wall and was unable to raise. She was in that position until the home health aide saw her next day. Patient states she did not head or lose consciousness. In the ER patient was found to have significant tenderness in the right thigh area with some skin blistering and swelling. Patient is able to move her hips without pain. In addition patient has been having some nausea and patient states that earlier today she did vomit once and she has some generalized abdominal discomfort. Patient otherwise denies any chest pain or shortness of breath. In the ER labs reveal elevated CK levels. Patient has been started IV fluids and admitted for further management.    Hospital Course:   Rhabdomyolysis -Following fall with prolonged period on the ground. -CK levels have trended down. -Ready to DC today. -Advised her to continue copious PO fluid intake while at SNF.  Fall -Has severe Multiple Sclerosis. -Has been evaluated by PT with  recommendations for SNF. -Will be going to SNF today for ST-rehab.  Blisters over right leg/hip -Over area that rubbed over her scooter when she fell PTA at home. -No signs of infection. -Blisters may open at some point, at which time local wound care will be needed.  Procedures:  None   Consultations:  None  Discharge Instructions  Discharge Orders   Future Appointments Provider Department Dept Phone   05/27/2013 1:00 PM [facility] [place] [facility] [phone]   Future Orders Complete By Expires   Diet - low sodium heart healthy  As directed    Discontinue IV  As directed    Increase activity slowly  As directed        Medication List    STOP taking these medications       furosemide 40 MG tablet  Commonly known as:  LASIX     nitrofurantoin 100 MG capsule  Commonly known as:  MACRODANTIN     potassium chloride SA 20 MEQ tablet  Commonly known as:  K-DUR,KLOR-CON     spironolactone 25 MG tablet  Commonly known as:  ALDACTONE     terconazole 0.4 % vaginal cream  Commonly known as:  TERAZOL 7      TAKE these medications       amantadine 100 MG capsule  Commonly known as:  SYMMETREL  Take 100 mg by mouth 2 (two) times daily.     carvedilol 6.25 MG tablet  Commonly known as:  COREG  Take 6.25 mg by mouth 2 (two) times daily with a meal.  gabapentin 600 MG tablet  Commonly known as:  NEURONTIN  Take 600 mg by mouth 3 (three) times daily.     HYDROcodone-acetaminophen 5-325 MG per tablet  Commonly known as:  NORCO/VICODIN  Take 1-2 tablets by mouth every 4 (four) hours as needed.     imipramine 50 MG tablet  Commonly known as:  TOFRANIL  Take 100 mg by mouth at bedtime.     multivitamin with minerals Tabs tablet  Take 1 tablet by mouth daily.     oxyCODONE-acetaminophen 5-325 MG per tablet  Commonly known as:  PERCOCET/ROXICET  Take 1 tablet by mouth every 6 (six) hours as needed.     solifenacin 10 MG tablet  Commonly  known as:  VESICARE  Take 10 mg by mouth 2 (two) times daily.     Travoprost (BAK Free) 0.004 % Soln ophthalmic solution  Commonly known as:  TRAVATAN  Place 1 drop into both eyes at bedtime.     triamcinolone cream 0.1 %  Commonly known as:  KENALOG  Apply 1 application topically as needed (inflammation, itching).     zolpidem 10 MG tablet  Commonly known as:  AMBIEN  Take 10 mg by mouth at bedtime as needed for sleep.       Allergies  Allergen Reactions  . Sulfonamide Derivatives Shortness Of Breath  . Penicillins Hives and Itching       Follow-up Information   [facility] with  [facility] (If symptoms worsen)    Specialty:  Emergency Medicine   Contact information:   [address] [place] [id number] [place] [address] [phone]      Follow up with Shannon Lor, NP.   Specialty:  Nurse Practitioner   Contact information:   [facility] [address] [place] [address] [phone]        The results of significant diagnostics from this hospitalization (including imaging, microbiology, ancillary and laboratory) are listed below for reference.    Significant Diagnostic Studies: Ct Abdomen Pelvis Wo Contrast  05/25/2013   CLINICAL DATA:  Nausea and vomiting. Generalized pain and weakness. White cell count 8.8. White cells in urine. History of hypertension, multiple sclerosis, C-section, and uterine ablation.  EXAM: CT ABDOMEN AND PELVIS WITHOUT CONTRAST  TECHNIQUE: Multidetector CT imaging of the abdomen and pelvis was performed following the standard protocol without intravenous contrast.  COMPARISON:  None.  FINDINGS: Dependent atelectasis in the lung bases. Small nodular opacity demonstrated in the left breast. Correlation with mammography is recommended.  Visualization of solid organs and vascular structures is limited without contrast material. However, there is no gross enlargement or focal  lesion demonstrated in the liver, spleen, kidneys, or pancreas. Adrenal glands are normal size. Multiple stones are demonstrated in the gallbladder. No definitive bile duct dilatation. The colon is diffusely stool-filled without distention. Stomach and small bowel are not distended. No free air or free fluid in the abdomen. No significant lymphadenopathy in the abdomen.  Pelvis: Calcification in the uterus consistent with a fibroid. No abnormal adnexal masses. Appendix is normal. No evidence of diverticulitis. No free or loculated pelvic fluid collections. Soft tissue infiltration in the subcutaneous fat over the right hip consistent with edema or contusions. Degenerative changes of the lumbar spine with mild anterior subluxation of L4 on L5, likely degenerative. No displaced pelvic or hip fractures identified.  IMPRESSION: Cholelithiasis.  No evidence of bowel obstruction or inflammation.   Electronically Signed   By: Chrissie Noa  Andria Meuse M.D.   On: 05/25/2013 04:04   Dg Chest 2 View  05/24/2013   CLINICAL DATA:  Weakness nausea and hyper 10  EXAM: CHEST  2 VIEW  COMPARISON:  11/05/2012  FINDINGS: Lateral radiograph is of limited utility due to superimposition of the patient's arms. Heart size is upper normal. Vascular pattern is normal. Lungs are clear.  IMPRESSION: Limited study, appears normal.   Electronically Signed   By: Esperanza Heir M.D.   On: 05/24/2013 21:56   Dg Femur Right  05/24/2013   CLINICAL DATA:  Fall.  Right femur pain.  Palpable hematoma.  EXAM: RIGHT FEMUR - 2 VIEW  COMPARISON:  None.  FINDINGS: There is no evidence of fracture or other focal bone lesions. Mild soft tissue swelling is seen involving the proximal thigh.  IMPRESSION: Proximal thigh soft tissue swelling.  No evidence of fracture.   Electronically Signed   By: Myles Rosenthal M.D.   On: 05/24/2013 23:08   Dg Pelvis Portable  05/25/2013   CLINICAL DATA:  Right hip pain after a fall. Unable to internally rotate the right leg.   EXAM: PORTABLE PELVIS 1-2 VIEWS  COMPARISON:  None.  FINDINGS: The right hip is externally rotated, limiting visualization and evaluation of the femoral neck. No displaced fractures are identified as visualized. There are degenerative changes in the right hip. The pelvis appears intact. Calcification in the pelvis consistent with a fibroid.  IMPRESSION: No displaced fractures identified in the right hip. However, positioning of the right hip limits evaluation of the femoral neck.   Electronically Signed   By: Burman Nieves M.D.   On: 05/25/2013 02:09    Microbiology: No results found for this or any previous visit (from the past 240 hour(s)).   Labs: Basic Metabolic Panel:  Recent Labs Lab 05/20/13 1430 05/24/13 2300 05/25/13 0510 05/26/13 0513  NA 140 141 140 141  K 2.9* 4.0 2.6* 2.9*  CL 100 100 100 104  CO2 34* 32 30 29  GLUCOSE 98 135* 132* 103*  BUN 19 18 16 14   CREATININE 1.2 0.93 0.86 0.98  CALCIUM 9.2 9.0 8.8 8.4  MG  --   --  1.8  --    Liver Function Tests:  Recent Labs Lab 05/24/13 2300 05/25/13 0510  AST 66* 39*  ALT 28 23  ALKPHOS 110 108  BILITOT 0.5 0.4  PROT 7.0 6.2  ALBUMIN 3.6 3.1*    Recent Labs Lab 05/24/13 2300  LIPASE 17   No results found for this basename: AMMONIA,  in the last 168 hours CBC:  Recent Labs Lab 05/24/13 2215 05/25/13 0510 05/26/13 0513  WBC 8.8 8.6 7.5  NEUTROABS  --  5.3  --   HGB 11.9* 11.0* 9.5*  HCT 34.5* 32.2* 27.9*  MCV 81.6 81.3 82.5  PLT 157 202 183   Cardiac Enzymes:  Recent Labs Lab 05/24/13 2300 05/25/13 0510 05/26/13 0513  CKTOTAL 2512* 1990* 1681*   BNP: BNP (last 3 results)  Recent Labs  12/15/12 1639 05/20/13 1430 05/24/13 2215  PROBNP 15.0 5.0 296.7*   CBG: No results found for this basename: GLUCAP,  in the last 168 hours     Signed:  Chaya Jan  [facility] Pager: [phone] 05/26/2013, 11:18 AM

## 2013-05-26 NOTE — Progress Notes (Signed)
CSW provided patient with bed offers. She is requesting [facility] and is excited that they have a bed available. She thanked CSW for her assistance.  Dierre Crevier C. Shaletta Hinostroza MSW, LCSW [phone]

## 2013-05-26 NOTE — Clinical Social Work Placement (Signed)
     Clinical Social Work Department CLINICAL SOCIAL WORK PLACEMENT NOTE 05/26/2013  Patient:  Shannon Garrett, Shannon Garrett  Account Number:  [id number] Admit date:  05/24/2013  Clinical Social Worker:  Becky Sax, LCSW  Date/time:  05/26/2013 12:00 M  Clinical Social Work is seeking post-discharge placement for this patient at the following level of care:   SKILLED NURSING   (*CSW will update this form in Epic as items are completed)   05/26/2013  Patient/family provided with [facility] Department of Clinical Social Works list of facilities offering this level of care within the geographic area requested by the patient (or if unable, by the patients family).  05/26/2013  Patient/family informed of their freedom to choose among providers that offer the needed level of care, that participate in Medicare, Medicaid or managed care program needed by the patient, have an available bed and are willing to accept the patient.  05/26/2013  Patient/family informed of [facility] ownership interest in [facility], as well as of the fact that they are under no obligation to receive care at this facility.  PASARR submitted to EDS on 05/26/2013 PASARR number received from EDS on 05/26/2013  FL2 transmitted to all facilities in geographic area requested by pt/family on  05/26/2013 FL2 transmitted to all facilities within larger geographic area on   Patient informed that his/her managed care company has contracts with or will negotiate with  certain facilities, including the following:     Patient/family informed of bed offers received:  05/26/2013 Patient chooses bed at [facility] Physician recommends and patient chooses bed at  [facility]  Patient to be transferred to [facility] on  05/26/2013 Patient to be transferred to facility by ptar  The following physician request were entered in Epic:   Additional Comments:

## 2013-05-26 NOTE — Progress Notes (Signed)
Telephone report given to [facility] staff member.

## 2013-05-27 ENCOUNTER — Other Ambulatory Visit: Payer: PRIVATE HEALTH INSURANCE

## 2013-05-27 ENCOUNTER — Other Ambulatory Visit: Payer: Self-pay

## 2013-05-27 MED ORDER — OXYCODONE-ACETAMINOPHEN 5-325 MG PO TABS: 1.0000 | ORAL_TABLET | Freq: Four times a day (QID) | ORAL | Status: DC | PRN

## 2013-05-27 MED ORDER — HYDROCODONE-ACETAMINOPHEN 5-325 MG PO TABS: ORAL_TABLET | ORAL | Status: DC

## 2013-05-27 MED ORDER — ZOLPIDEM TARTRATE 10 MG PO TABS: 10.0000 mg | ORAL_TABLET | Freq: Every evening | ORAL | Status: DC | PRN

## 2013-05-27 NOTE — Addendum Note (Signed)
Addended by: Maurice Small on: 05/27/2013 09:46 AM   Modules accepted: Orders

## 2013-05-27 NOTE — ED Provider Notes (Signed)
Pt discussed with me, prior to end of shift   Medical screening examination/treatment/procedure(s) were performed by non-physician practitioner and as supervising physician I was immediately available for consultation/collaboration.  EKG Interpretation    Date/Time:  Sunday May 24 2013 22:06:15 EST Ventricular Rate:  97 PR Interval:  193 QRS Duration: 94 QT Interval:  350 QTC Calculation: 445 R Axis:   35 Text Interpretation:  Sinus rhythm Ventricular premature complex Nonspecific T abnormalities, lateral leads Electrode noise No significant change since last tracing Confirmed by Jacquelina Hewins  MD-I, Quint Chestnut ([id number]) on 05/24/2013 10:11:37 PM            Devoria Albe, MD, Armando Gang   Ward Givens, MD 05/27/13 [phone]

## 2013-06-01 ENCOUNTER — Non-Acute Institutional Stay (SKILLED_NURSING_FACILITY): Payer: PRIVATE HEALTH INSURANCE | Admitting: Internal Medicine

## 2013-06-01 DIAGNOSIS — G35 Multiple sclerosis: Secondary | ICD-10-CM

## 2013-06-01 DIAGNOSIS — R5381 Other malaise: Secondary | ICD-10-CM

## 2013-06-01 DIAGNOSIS — I10 Essential (primary) hypertension: Secondary | ICD-10-CM

## 2013-06-01 DIAGNOSIS — R531 Weakness: Secondary | ICD-10-CM

## 2013-06-01 DIAGNOSIS — G4700 Insomnia, unspecified: Secondary | ICD-10-CM

## 2013-06-01 DIAGNOSIS — N319 Neuromuscular dysfunction of bladder, unspecified: Secondary | ICD-10-CM

## 2013-06-01 NOTE — Progress Notes (Signed)
Patient ID: Shannon Garrett, female   DOB: [birth year], 52 y.o.   MRN: [id number]  [facility]    PCP: Marletta Lor, NP  Code Status: full code  Allergies  Allergen Reactions  . Sulfonamide Derivatives Shortness Of Breath  . Penicillins Hives and Itching    Chief Complaint: new admit  HPI:  52 y/o female patient with history of multiple sclerosis, chronic anemia and nonischemic cardiomyopathy was admitted to the hospital after a fall and lying in floor for a day. 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 She was then sent to SNF for STR Patient was seen in her room today.she is in no distress. Her pain is under control. She is getting skin care for the blister in her thigh. She is working with therapy team. No concerns this visit.  Review of Systems  Constitutional: Negative for fever, chills, weight loss, malaise/fatigue and diaphoresis.  HENT: Negative for congestion, hearing loss and sore throat.   Eyes: Negative for blurred vision, double vision and discharge.  Respiratory: Negative for cough, sputum production, shortness of breath and wheezing.   Cardiovascular: Negative for chest pain, palpitations, orthopnea and leg swelling.  Gastrointestinal: Negative for heartburn, nausea, vomiting, abdominal pain, diarrhea and constipation.  Genitourinary: Negative for dysuria, urgency, frequency and flank pain.  Musculoskeletal: Negative for back pain but has generalized aches and joint pain. Skin: Negative for itching and rash.  Neurological: Positive for weakness. Negative for dizziness, tingling, focal weakness and headaches.  Psychiatric/Behavioral: Negative for depression and memory loss. The patient is not nervous/anxious.     Past Medical History  Diagnosis Date  . MS (multiple sclerosis)     a. Dx'd late 20's. b. Tx with Novantrone, Tysabri, Copaxone previously.  Marland Kitchen Hypertension   . Depression   . Glaucoma   . Cardiomyopathy     a.  Echo  04/29/12: Mild LVH, EF 20-25%, mild AI, moderate MR, moderate LAE, mild RAE, mild RVE, moderate TR, PASP 51, small pericardial effusion;   b. probably non-ischemic given multiple chemo-Tx agents used for MS and global LV dysfn on echo  . Chronic systolic heart failure   . CHF (congestive heart failure)   . Shortness of breath   . Neuromuscular disorder     MS   Past Surgical History  Procedure Laterality Date  . Ablation      uterine  . Cesarean section     Social History:   reports that she has never smoked. She has never used smokeless tobacco. She reports that she does not drink alcohol or use illicit drugs.  Family History  Problem Relation Age of Onset  . Hypertension Mother     Medications: Patient's Medications  New Prescriptions   No medications on file  Previous Medications   AMANTADINE (SYMMETREL) 100 MG CAPSULE    Take 100 mg by mouth 2 (two) times daily.    CARVEDILOL (COREG) 6.25 MG TABLET    Take 6.25 mg by mouth 2 (two) times daily with a meal.   GABAPENTIN (NEURONTIN) 600 MG TABLET    Take 600 mg by mouth 3 (three) times daily.    HYDROCODONE-ACETAMINOPHEN (NORCO/VICODIN) 5-325 MG PER TABLET    Take 1-2 tablets by mouth every 4 (four) hours as needed.   HYDROCODONE-ACETAMINOPHEN (NORCO/VICODIN) 5-325 MG PER TABLET    1 by mouth every 4 hours as needed for mild pain DO NOT EXCEED 4 GM OF TYLENOL IN 24 HOURS, take 2 by  mouth every 4 hours as needed for moderate to severe pain. DO NOT EXCEED 4 GM OF TYLENOL IN 24 HOURS   IMIPRAMINE (TOFRANIL) 50 MG TABLET    Take 100 mg by mouth at bedtime.   MULTIPLE VITAMIN (MULTIVITAMIN WITH MINERALS) TABS TABLET    Take 1 tablet by mouth daily.   OXYCODONE-ACETAMINOPHEN (PERCOCET/ROXICET) 5-325 MG PER TABLET    Take 1 tablet by mouth every 6 (six) hours as needed.   OXYCODONE-ACETAMINOPHEN (PERCOCET/ROXICET) 5-325 MG PER TABLET    Take 1 tablet by mouth every 6 (six) hours as needed (DO NOT EXCEED 4 GM OF TYLENOL IN 24 HOURS).    SOLIFENACIN (VESICARE) 10 MG TABLET    Take 10 mg by mouth 2 (two) times daily.    TRAVOPROST, BAK FREE, (TRAVATAN) 0.004 % SOLN OPHTHALMIC SOLUTION    Place 1 drop into both eyes at bedtime.    TRIAMCINOLONE CREAM (KENALOG) 0.1 %    Apply 1 application topically as needed (inflammation, itching).    ZOLPIDEM (AMBIEN) 10 MG TABLET    Take 1 tablet (10 mg total) by mouth at bedtime as needed for sleep.  Modified Medications   No medications on file  Discontinued Medications   No medications on file     Physical Exam: Filed Vitals:   06/01/13 1052  BP: 140/83  Pulse: 88  Temp: 97.5 F (36.4 C)  Resp: 18   General- adult female in no acute distress Head- atraumatic, normocephalic Eyes- PERRLA, EOMI, no pallor, no icterus, no discharge Neck- no lymphadenopathy, no thyromegaly, no jugular vein distension, no carotid bruit Chest- no chest wall deformities, no chest wall tenderness Cardiovascular- normal s1,s2, no murmurs/ rubs/ gallops Respiratory- bilateral clear to auscultation, no wheeze, no rhonchi, no crackles Abdomen- bowel sounds present, soft, non tender, no organomegaly Musculoskeletal- wheelchair bound, weakness of lower extremities Neurological- no focal deficit, normal reflexes, normal muscle strength, normal sensation to fine touch and vibration Skin- blister has drained in right anterior thigh, dressing clean and dry Psychiatry- alert and oriented to person, place and time, normal mood and affect   Labs reviewed: Basic Metabolic Panel:  Recent Labs  96/04/54 0411  05/24/13 2300 05/25/13 0510 05/26/13 0513  NA 136  < > 141 140 141  K 4.0  < > 4.0 2.6* 2.9*  CL 102  < > 100 100 104  CO2 26  < > 32 30 29  GLUCOSE 89  < > 135* 132* 103*  BUN 14  < > 18 16 14   CREATININE 0.63  < > 0.93 0.86 0.98  CALCIUM 8.6  < > 9.0 8.8 8.4  MG 2.0  --   --  1.8  --   < > = values in this interval not displayed. Liver Function Tests:  Recent Labs  11/05/12 1618  05/24/13 2300 05/25/13 0510  AST 13 66* 39*  ALT 11 28 23   ALKPHOS 105 110 108  BILITOT 0.3 0.5 0.4  PROT 6.4 7.0 6.2  ALBUMIN 3.1* 3.6 3.1*    Recent Labs  05/24/13 2300  LIPASE 17   No results found for this basename: AMMONIA,  in the last 8760 hours CBC:  Recent Labs  10/11/12 2017 11/05/12 1618 05/24/13 2215 05/25/13 0510 05/26/13 0513  WBC 5.8 5.3 8.8 8.6 7.5  NEUTROABS 3.7 3.7  --  5.3  --   HGB 11.3* 10.9* 11.9* 11.0* 9.5*  HCT 32.6* 31.5* 34.5* 32.2* 27.9*  MCV 86.0 84.5 81.6 81.3 82.5  PLT  232 215 157 202 183   Cardiac Enzymes:  Recent Labs  09/04/12 1715 09/04/12 2149 09/05/12 0700  05/24/13 2300 05/25/13 0510 05/26/13 0513  CKTOTAL  --   --   --   < > 2512* 1990* 1681*  TROPONINI <0.30 <0.30 <0.30  --   --   --   --   < > = values in this interval not displayed. BNP: No components found with this basename: POCBNP,  CBG:  Recent Labs  06/06/12 1805  GLUCAP 108*    Radiological Exams: Ct Abdomen Pelvis Wo Contrast  05/25/2013   CLINICAL DATA:  Nausea and vomiting. Generalized pain and weakness. White cell count 8.8. White cells in urine. History of hypertension, multiple sclerosis, C-section, and uterine ablation.  EXAM: CT ABDOMEN AND PELVIS WITHOUT CONTRAST  TECHNIQUE: Multidetector CT imaging of the abdomen and pelvis was performed following the standard protocol without intravenous contrast.  COMPARISON:  None.  FINDINGS: Dependent atelectasis in the lung bases. Small nodular opacity demonstrated in the left breast. Correlation with mammography is recommended.  Visualization of solid organs and vascular structures is limited without contrast material. However, there is no gross enlargement or focal lesion demonstrated in the liver, spleen, kidneys, or pancreas. Adrenal glands are normal size. Multiple stones are demonstrated in the gallbladder. No definitive bile duct dilatation. The colon is diffusely stool-filled without distention. Stomach  and small bowel are not distended. No free air or free fluid in the abdomen. No significant lymphadenopathy in the abdomen.  Pelvis: Calcification in the uterus consistent with a fibroid. No abnormal adnexal masses. Appendix is normal. No evidence of diverticulitis. No free or loculated pelvic fluid collections. Soft tissue infiltration in the subcutaneous fat over the right hip consistent with edema or contusions. Degenerative changes of the lumbar spine with mild anterior subluxation of L4 on L5, likely degenerative. No displaced pelvic or hip fractures identified.  IMPRESSION: Cholelithiasis.  No evidence of bowel obstruction or inflammation.  Electronically Signed   By: Burman Nieves M.D.   On: 05/25/2013 04:04   Dg Chest 2 View  05/24/2013   CLINICAL DATA:  Weakness nausea and hyper 10  EXAM: CHEST  2 VIEW  COMPARISON:  11/05/2012  FINDINGS: Lateral radiograph is of limited utility due to superimposition of the patient's arms. Heart size is upper normal. Vascular pattern is normal. Lungs are clear.  IMPRESSION: Limited study, appears normal.   Electronically Signed   By: Esperanza Heir M.D.   On: 05/24/2013 21:56   Dg Femur Right  05/24/2013   CLINICAL DATA:  Fall.  Right femur pain.  Palpable hematoma.  EXAM: RIGHT FEMUR - 2 VIEW  COMPARISON:  None.  FINDINGS: There is no evidence of fracture or other focal bone lesions. Mild soft tissue swelling is seen involving the proximal thigh.  IMPRESSION: Proximal thigh soft tissue swelling.  No evidence of fracture.   Electronically Signed   By: Myles Rosenthal M.D.   On: 05/24/2013 23:08   Dg Pelvis Portable  05/25/2013   CLINICAL DATA:  Right hip pain after a fall. Unable to internally rotate the right leg.  EXAM: PORTABLE PELVIS 1-2 VIEWS  COMPARISON:  None.  FINDINGS: The right hip is externally rotated, limiting visualization and evaluation of the femoral neck. No displaced fractures are identified as visualized. There are degenerative changes in the  right hip. The pelvis appears intact. Calcification in the pelvis consistent with a fibroid.  IMPRESSION: No displaced fractures identified in the right hip.  However, positioning of the right hip limits evaluation of the femoral neck.   Electronically Signed   By: Burman Nieves M.D.   On: 05/25/2013 02:09    Assessment/Plan  Generalized weakness- from recent rhabdomylosis in setting of her MS and anemia. Will have her work with therapy team. Fall precautions. Monitor nutrtional intake. Continue skin care. Fall precautions  Hypertension- continue her coreg 6.25 mg bid, monitor clinically  Chronic anemia- no reported bleed, low hb/hct. Monitor cbc  MS-On both percocet and norco for pain,. Will stop norco and have her on percocet prn only. ontinue gabapentin 600 mg tid and imipramine 100 mg at bedtime, monitor clinically. Continue the amantadine   Neurogenic bladder-Continue solifenacin 10 mg bid  Insomnia- continue zolpidem 10 mg daily prn for sleep  Family/ staff Communication: reviewed care plan with patient and nursing supervisor   Goals of care: STR and then return home   Labs/tests ordered: cbc, cmp

## 2013-06-03 DIAGNOSIS — G4700 Insomnia, unspecified: Secondary | ICD-10-CM | POA: Insufficient documentation

## 2013-06-03 DIAGNOSIS — R531 Weakness: Secondary | ICD-10-CM | POA: Insufficient documentation

## 2013-06-03 DIAGNOSIS — N319 Neuromuscular dysfunction of bladder, unspecified: Secondary | ICD-10-CM | POA: Insufficient documentation

## 2013-06-05 ENCOUNTER — Other Ambulatory Visit: Payer: Self-pay | Admitting: *Deleted

## 2013-06-05 MED ORDER — OXYCODONE-ACETAMINOPHEN 5-325 MG PO TABS: ORAL_TABLET | ORAL | Status: DC

## 2013-06-09 ENCOUNTER — Other Ambulatory Visit: Payer: Self-pay | Admitting: *Deleted

## 2013-06-09 MED ORDER — ZOLPIDEM TARTRATE 10 MG PO TABS: 10.0000 mg | ORAL_TABLET | Freq: Every evening | ORAL | Status: DC | PRN

## 2013-06-10 ENCOUNTER — Other Ambulatory Visit: Payer: Self-pay | Admitting: *Deleted

## 2013-06-10 MED ORDER — ZOLPIDEM TARTRATE 5 MG PO TABS: ORAL_TABLET | ORAL | Status: DC

## 2013-06-11 ENCOUNTER — Non-Acute Institutional Stay (SKILLED_NURSING_FACILITY): Payer: PRIVATE HEALTH INSURANCE | Admitting: Nurse Practitioner

## 2013-06-11 DIAGNOSIS — R531 Weakness: Secondary | ICD-10-CM

## 2013-06-11 DIAGNOSIS — R5381 Other malaise: Secondary | ICD-10-CM

## 2013-06-11 DIAGNOSIS — G4700 Insomnia, unspecified: Secondary | ICD-10-CM

## 2013-06-11 DIAGNOSIS — G35 Multiple sclerosis: Secondary | ICD-10-CM

## 2013-06-16 DIAGNOSIS — Z5189 Encounter for other specified aftercare: Secondary | ICD-10-CM

## 2013-06-16 DIAGNOSIS — G609 Hereditary and idiopathic neuropathy, unspecified: Secondary | ICD-10-CM

## 2013-06-16 DIAGNOSIS — G35D Multiple sclerosis, unspecified: Secondary | ICD-10-CM

## 2013-06-16 DIAGNOSIS — M6282 Rhabdomyolysis: Secondary | ICD-10-CM

## 2013-06-16 DIAGNOSIS — I10 Essential (primary) hypertension: Secondary | ICD-10-CM

## 2013-06-16 DIAGNOSIS — R269 Unspecified abnormalities of gait and mobility: Secondary | ICD-10-CM

## 2013-06-16 DIAGNOSIS — G35 Multiple sclerosis: Secondary | ICD-10-CM

## 2013-07-14 NOTE — Progress Notes (Signed)
CODE STATUS full code [facility] CODE STATUS full code Date of visit 06/11/2013  Patient ID: Shannon Garrett, female   DOB: 30-Nov-1960, 53 y.o.   MRN: [id number]   Allergies  Allergen Reactions  . Sulfonamide Derivatives Shortness Of Breath  . Penicillins Hives and Itching    Chief Complaint  Patient presents with  . Discharge Note    HPI:  53 y/o female patient with history of multiple sclerosis, chronic anemia and nonischemic cardiomyopathy was admitted to the hospital after a fall and lying in floor for a day. 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   Patient is now ready for discharge to home.  Patient was seen in her room today.she is in no distress. Her pain is under control. She is getting skin care for the blister in her thigh. She is working with therapy team. No concerns this visit.  Review of Systems  Constitutional: Negative for fever, chills, weight loss, malaise/fatigue and diaphoresis.  HENT: Negative for congestion, hearing loss and sore throat.   Eyes: Negative for blurred vision, double vision and discharge.  Respiratory: Negative for cough, sputum production, shortness of breath and wheezing.   Cardiovascular: Negative for chest pain, palpitations, orthopnea and leg swelling.  Gastrointestinal: Negative for heartburn, nausea, vomiting, abdominal pain, diarrhea and constipation.  Genitourinary: Negative for dysuria, urgency, frequency and flank pain.  Musculoskeletal: Negative for back pain but has generalized aches and joint pain. Skin: Negative for itching and rash.  Neurological: Positive for weakness. Negative for dizziness, tingling, focal weakness and headaches.  Psychiatric/Behavioral: Negative for depression and memory loss. The patient is not nervous/anxious.     Past Medical History  Diagnosis Date  . MS (multiple sclerosis)     a. Dx'd late 20's. b. Tx with Novantrone, Tysabri, Copaxone previously.  Marland Kitchen  Hypertension   . Depression   . Glaucoma   . Cardiomyopathy     a.  Echo 04/29/12: Mild LVH, EF 20-25%, mild AI, moderate MR, moderate LAE, mild RAE, mild RVE, moderate TR, PASP 51, small pericardial effusion;   b. probably non-ischemic given multiple chemo-Tx agents used for MS and global LV dysfn on echo  . Chronic systolic heart failure   . CHF (congestive heart failure)   . Shortness of breath   . Neuromuscular disorder     MS   Past Surgical History  Procedure Laterality Date  . Ablation      uterine  . Cesarean section     Social History:   reports that she has never smoked. She has never used smokeless tobacco. She reports that she does not drink alcohol or use illicit drugs.  Family History  Problem Relation Age of Onset  . Hypertension Mother     Medications: Patient's Medications  New Prescriptions   No medications on file  Previous Medications   AMANTADINE (SYMMETREL) 100 MG CAPSULE    Take 100 mg by mouth 2 (two) times daily.    CARVEDILOL (COREG) 6.25 MG TABLET    Take 6.25 mg by mouth 2 (two) times daily with a meal.   GABAPENTIN (NEURONTIN) 600 MG TABLET    Take 600 mg by mouth 3 (three) times daily.    HYDROCODONE-ACETAMINOPHEN (NORCO/VICODIN) 5-325 MG PER TABLET    Take 1-2 tablets by mouth every 4 (four) hours as needed.   HYDROCODONE-ACETAMINOPHEN (NORCO/VICODIN) 5-325 MG PER TABLET    1 by mouth every 4 hours as needed for mild  pain DO NOT EXCEED 4 GM OF TYLENOL IN 24 HOURS, take 2 by mouth every 4 hours as needed for moderate to severe pain. DO NOT EXCEED 4 GM OF TYLENOL IN 24 HOURS   IMIPRAMINE (TOFRANIL) 50 MG TABLET    Take 100 mg by mouth at bedtime.   MULTIPLE VITAMIN (MULTIVITAMIN WITH MINERALS) TABS TABLET    Take 1 tablet by mouth daily.   OXYCODONE-ACETAMINOPHEN (PERCOCET/ROXICET) 5-325 MG PER TABLET    Take 1 tablet by mouth every 6 (six) hours as needed.   OXYCODONE-ACETAMINOPHEN (PERCOCET/ROXICET) 5-325 MG PER TABLET    Take one tablet by mouth  every 6 hours as needed for pain 1-5; Take two tablets by mouth every 6 hours as needed for pain 6-10   SOLIFENACIN (VESICARE) 10 MG TABLET    Take 10 mg by mouth 2 (two) times daily.    TRAVOPROST, BAK FREE, (TRAVATAN) 0.004 % SOLN OPHTHALMIC SOLUTION    Place 1 drop into both eyes at bedtime.    TRIAMCINOLONE CREAM (KENALOG) 0.1 %    Apply 1 application topically as needed (inflammation, itching).    ZOLPIDEM (AMBIEN) 10 MG TABLET    Take 1 tablet (10 mg total) by mouth at bedtime as needed for sleep.   ZOLPIDEM (AMBIEN) 5 MG TABLET    Take one tablet by mouth every night at bedtime as needed for sleep  Modified Medications   No medications on file  Discontinued Medications   No medications on file   Vital Signs: Blood pressure 142/76 Pulse 80 Respiratory rate 20 Temp 98 1 Pulse oximetry 99%  Physical Exam:  General- adult female in no acute distress Head- atraumatic, normocephalic Eyes- PERRLA, EOMI, no pallor, no icterus, no discharge Neck- no lymphadenopathy, no thyromegaly, no jugular vein distension, no carotid bruit Chest- no chest wall deformities, no chest wall tenderness Cardiovascular- normal s1,s2, no murmurs/ rubs/ gallops Respiratory- bilateral clear to auscultation, no wheeze, no rhonchi, no crackles Abdomen- bowel sounds present, soft, non tender, no organomegaly Musculoskeletal- wheelchair bound, weakness of lower extremities Neurological- no focal deficit, normal reflexes, normal muscle strength, normal sensation to fine touch and vibration Skin- blistered area has healed  Psychiatry- alert and oriented to person, place and time, normal mood and affect   Labs reviewed: Basic Metabolic Panel:  Recent Labs  09/11/12 0411  05/24/13 2300 05/25/13 0510 05/26/13 0513  NA 136  < > 141 140 141  K 4.0  < > 4.0 2.6* 2.9*  CL 102  < > 100 100 104  CO2 26  < > 32 30 29  GLUCOSE 89  < > 135* 132* 103*  BUN 14  < > 18 16 14   CREATININE 0.63  < > 0.93 0.86 0.98    CALCIUM 8.6  < > 9.0 8.8 8.4  MG 2.0  --   --  1.8  --   < > = values in this interval not displayed. Liver Function Tests:  Recent Labs  11/05/12 1618 05/24/13 2300 05/25/13 0510  AST 13 66* 39*  ALT 11 28 23   ALKPHOS 105 110 108  BILITOT 0.3 0.5 0.4  PROT 6.4 7.0 6.2  ALBUMIN 3.1* 3.6 3.1*    Recent Labs  05/24/13 2300  LIPASE 17   No results found for this basename: AMMONIA,  in the last 8760 hours CBC:  Recent Labs  10/11/12 2017 11/05/12 1618 05/24/13 2215 05/25/13 0510 05/26/13 0513  WBC 5.8 5.3 8.8 8.6 7.5  NEUTROABS 3.7 3.7  --  5.3  --   HGB 11.3* 10.9* 11.9* 11.0* 9.5*  HCT 32.6* 31.5* 34.5* 32.2* 27.9*  MCV 86.0 84.5 81.6 81.3 82.5  PLT 232 215 157 202 183   Cardiac Enzymes:  Recent Labs  09/04/12 1715 09/04/12 2149 09/05/12 0700  05/24/13 2300 05/25/13 0510 05/26/13 0513  CKTOTAL  --   --   --   < > 2512* 1990* 1681*  TROPONINI <0.30 <0.30 <0.30  --   --   --   --   < > = values in this interval not displayed. BNP: No components found with this basename: POCBNP,  CBG: No results found for this basename: GLUCAP,  in the last 8760 hours  Labs from 06/08/2013 Sodium 140 Potassium 3.9 Chloride 105 CO2 31 AGAP 5 Glucose 146 BUN 10 Creatinine 0.9 Calcium 8.6  Radiological Exams: Ct Abdomen Pelvis Wo Contrast  05/25/2013   CLINICAL DATA:  Nausea and vomiting. Generalized pain and weakness. White cell count 8.8. White cells in urine. History of hypertension, multiple sclerosis, C-section, and uterine ablation.  EXAM: CT ABDOMEN AND PELVIS WITHOUT CONTRAST  TECHNIQUE: Multidetector CT imaging of the abdomen and pelvis was performed following the standard protocol without intravenous contrast.  COMPARISON:  None.  FINDINGS: Dependent atelectasis in the lung bases. Small nodular opacity demonstrated in the left breast. Correlation with mammography is recommended.  Visualization of solid organs and vascular structures is limited without  contrast material. However, there is no gross enlargement or focal lesion demonstrated in the liver, spleen, kidneys, or pancreas. Adrenal glands are normal size. Multiple stones are demonstrated in the gallbladder. No definitive bile duct dilatation. The colon is diffusely stool-filled without distention. Stomach and small bowel are not distended. No free air or free fluid in the abdomen. No significant lymphadenopathy in the abdomen.  Pelvis: Calcification in the uterus consistent with a fibroid. No abnormal adnexal masses. Appendix is normal. No evidence of diverticulitis. No free or loculated pelvic fluid collections. Soft tissue infiltration in the subcutaneous fat over the right hip consistent with edema or contusions. Degenerative changes of the lumbar spine with mild anterior subluxation of L4 on L5, likely degenerative. No displaced pelvic or hip fractures identified.  IMPRESSION: Cholelithiasis.  No evidence of bowel obstruction or inflammation.  Electronically Signed   By: Lucienne Capers M.D.   On: 05/25/2013 04:04   Dg Chest 2 View  05/24/2013   CLINICAL DATA:  Weakness nausea and hyper 10  EXAM: CHEST  2 VIEW  COMPARISON:  11/05/2012  FINDINGS: Lateral radiograph is of limited utility due to superimposition of the patient's arms. Heart size is upper normal. Vascular pattern is normal. Lungs are clear.  IMPRESSION: Limited study, appears normal.   Electronically Signed   By: Skipper Cliche M.D.   On: 05/24/2013 21:56   Dg Femur Right  05/24/2013   CLINICAL DATA:  Fall.  Right femur pain.  Palpable hematoma.  EXAM: RIGHT FEMUR - 2 VIEW  COMPARISON:  None.  FINDINGS: There is no evidence of fracture or other focal bone lesions. Mild soft tissue swelling is seen involving the proximal thigh.  IMPRESSION: Proximal thigh soft tissue swelling.  No evidence of fracture.   Electronically Signed   By: Earle Gell M.D.   On: 05/24/2013 23:08   Dg Pelvis Portable  05/25/2013   CLINICAL DATA:  Right  hip pain after a fall. Unable to internally rotate the right leg.  EXAM: PORTABLE PELVIS 1-2 VIEWS  COMPARISON:  None.  FINDINGS:  The right hip is externally rotated, limiting visualization and evaluation of the femoral neck. No displaced fractures are identified as visualized. There are degenerative changes in the right hip. The pelvis appears intact. Calcification in the pelvis consistent with a fibroid.  IMPRESSION: No displaced fractures identified in the right hip. However, positioning of the right hip limits evaluation of the femoral neck.   Electronically Signed   By: Lucienne Capers M.D.   On: 05/25/2013 02:09    Assessment/Plan  Generalized weakness- from recent rhabdomylosis in setting of her MS and anemia. patient is to be discharged home she will need home health services of physical/occupational therapy. Focus will be on strengthening, mobility, safety, and adaptation for ADLs.   Hypertension/cardiomyopathy - continue her coreg 6.25 mg bid, monitor clinically, will have followup by cardiology  Chronic anemia- no reported bleed, low hb/hct. Monitor cbc  MS-On both percocet and norco for pain,. Will stop norco and have her on percocet prn only. ontinue gabapentin 600 mg tid and imipramine 100 mg at bedtime, monitor clinically. Continue the amantadine   Neurogenic bladder-Continue solifenacin 10 mg bid  Insomnia- continue zolpidem 10 mg daily prn for sleep  Patient will have ongoing and careful followup by her primary care provider and other specialists.

## 2013-07-31 ENCOUNTER — Encounter (HOSPITAL_COMMUNITY): Payer: Self-pay | Admitting: Emergency Medicine

## 2013-07-31 ENCOUNTER — Observation Stay (HOSPITAL_COMMUNITY)
Admission: EM | Admit: 2013-07-31 | Discharge: 2013-08-02 | Disposition: A | Discharge status: Home / Self Care | Payer: PRIVATE HEALTH INSURANCE | Attending: Internal Medicine | Admitting: Internal Medicine

## 2013-07-31 DIAGNOSIS — I080 Rheumatic disorders of both mitral and aortic valves: Secondary | ICD-10-CM | POA: Insufficient documentation

## 2013-07-31 DIAGNOSIS — R627 Adult failure to thrive: Principal | ICD-10-CM | POA: Insufficient documentation

## 2013-07-31 DIAGNOSIS — Z7409 Other reduced mobility: Secondary | ICD-10-CM

## 2013-07-31 DIAGNOSIS — I110 Hypertensive heart disease with heart failure: Secondary | ICD-10-CM | POA: Diagnosis present

## 2013-07-31 DIAGNOSIS — R531 Weakness: Secondary | ICD-10-CM

## 2013-07-31 DIAGNOSIS — I509 Heart failure, unspecified: Secondary | ICD-10-CM | POA: Insufficient documentation

## 2013-07-31 DIAGNOSIS — D649 Anemia, unspecified: Secondary | ICD-10-CM | POA: Insufficient documentation

## 2013-07-31 DIAGNOSIS — Z91199 Patient's noncompliance with other medical treatment and regimen due to unspecified reason: Secondary | ICD-10-CM

## 2013-07-31 DIAGNOSIS — G8220 Paraplegia, unspecified: Secondary | ICD-10-CM | POA: Insufficient documentation

## 2013-07-31 DIAGNOSIS — Z23 Encounter for immunization: Secondary | ICD-10-CM | POA: Insufficient documentation

## 2013-07-31 DIAGNOSIS — I10 Essential (primary) hypertension: Secondary | ICD-10-CM | POA: Insufficient documentation

## 2013-07-31 DIAGNOSIS — I5022 Chronic systolic (congestive) heart failure: Secondary | ICD-10-CM | POA: Insufficient documentation

## 2013-07-31 DIAGNOSIS — I428 Other cardiomyopathies: Secondary | ICD-10-CM | POA: Insufficient documentation

## 2013-07-31 DIAGNOSIS — I5042 Chronic combined systolic (congestive) and diastolic (congestive) heart failure: Secondary | ICD-10-CM

## 2013-07-31 DIAGNOSIS — R238 Other skin changes: Secondary | ICD-10-CM | POA: Diagnosis present

## 2013-07-31 DIAGNOSIS — G35 Multiple sclerosis: Secondary | ICD-10-CM | POA: Insufficient documentation

## 2013-07-31 DIAGNOSIS — F3289 Other specified depressive episodes: Secondary | ICD-10-CM | POA: Insufficient documentation

## 2013-07-31 DIAGNOSIS — M6282 Rhabdomyolysis: Secondary | ICD-10-CM | POA: Diagnosis present

## 2013-07-31 DIAGNOSIS — IMO0002 Reserved for concepts with insufficient information to code with codable children: Secondary | ICD-10-CM | POA: Diagnosis present

## 2013-07-31 DIAGNOSIS — F329 Major depressive disorder, single episode, unspecified: Secondary | ICD-10-CM | POA: Insufficient documentation

## 2013-07-31 DIAGNOSIS — L988 Other specified disorders of the skin and subcutaneous tissue: Secondary | ICD-10-CM | POA: Insufficient documentation

## 2013-07-31 DIAGNOSIS — I5032 Chronic diastolic (congestive) heart failure: Secondary | ICD-10-CM

## 2013-07-31 DIAGNOSIS — E876 Hypokalemia: Secondary | ICD-10-CM | POA: Insufficient documentation

## 2013-07-31 DIAGNOSIS — T148XXA Other injury of unspecified body region, initial encounter: Secondary | ICD-10-CM

## 2013-07-31 DIAGNOSIS — Z9119 Patient's noncompliance with other medical treatment and regimen: Secondary | ICD-10-CM | POA: Insufficient documentation

## 2013-07-31 LAB — URINALYSIS, ROUTINE W REFLEX MICROSCOPIC
Bilirubin Urine: NEGATIVE
Glucose, UA: NEGATIVE mg/dL
Hgb urine dipstick: NEGATIVE
Ketones, ur: NEGATIVE mg/dL
Nitrite: NEGATIVE
PROTEIN: NEGATIVE mg/dL
SPECIFIC GRAVITY, URINE: 1.012 (ref 1.005–1.030)
UROBILINOGEN UA: 0.2 mg/dL (ref 0.0–1.0)
pH: 7 (ref 5.0–8.0)

## 2013-07-31 LAB — CBC WITH DIFFERENTIAL/PLATELET
BASOS ABS: 0.1 10*3/uL (ref 0.0–0.1)
Basophils Relative: 2 % — ABNORMAL HIGH (ref 0–1)
EOS ABS: 0.5 10*3/uL (ref 0.0–0.7)
EOS PCT: 9 % — AB (ref 0–5)
HCT: 33.4 % — ABNORMAL LOW (ref 36.0–46.0)
Hemoglobin: 11.1 g/dL — ABNORMAL LOW (ref 12.0–15.0)
Lymphocytes Relative: 31 % (ref 12–46)
Lymphs Abs: 2 10*3/uL (ref 0.7–4.0)
MCH: 27 pg (ref 26.0–34.0)
MCHC: 33.2 g/dL (ref 30.0–36.0)
MCV: 81.3 fL (ref 78.0–100.0)
Monocytes Absolute: 0.6 10*3/uL (ref 0.1–1.0)
Monocytes Relative: 9 % (ref 3–12)
NEUTROS PCT: 49 % (ref 43–77)
Neutro Abs: 3.1 10*3/uL (ref 1.7–7.7)
Platelets: 272 10*3/uL (ref 150–400)
RBC: 4.11 MIL/uL (ref 3.87–5.11)
RDW: 15.2 % (ref 11.5–15.5)
WBC: 6.2 10*3/uL (ref 4.0–10.5)

## 2013-07-31 LAB — BASIC METABOLIC PANEL
BUN: 9 mg/dL (ref 6–23)
CALCIUM: 10.1 mg/dL (ref 8.4–10.5)
CO2: 35 mEq/L — ABNORMAL HIGH (ref 19–32)
Chloride: 97 mEq/L (ref 96–112)
Creatinine, Ser: 1.14 mg/dL — ABNORMAL HIGH (ref 0.50–1.10)
GFR calc Af Amer: 64 mL/min — ABNORMAL LOW (ref >90)
GFR, EST NON AFRICAN AMERICAN: 55 mL/min — AB (ref >90)
Glucose, Bld: 116 mg/dL — ABNORMAL HIGH (ref 70–99)
POTASSIUM: 3.3 meq/L — AB (ref 3.7–5.3)
SODIUM: 143 meq/L (ref 137–147)

## 2013-07-31 LAB — URINE MICROSCOPIC-ADD ON

## 2013-07-31 MED ORDER — CARVEDILOL 6.25 MG PO TABS
6.2500 mg | ORAL_TABLET | Freq: Two times a day (BID) | ORAL | Status: DC
  Administered 2013-08-01 – 2013-08-02 (×2): 6.25 mg via ORAL
  Filled 2013-07-31 (×5): qty 1

## 2013-07-31 MED ORDER — ASPIRIN EC 81 MG PO TBEC
81.0000 mg | DELAYED_RELEASE_TABLET | Freq: Every day | ORAL | Status: DC
  Administered 2013-08-01 – 2013-08-02 (×2): 81 mg via ORAL
  Filled 2013-07-31 (×3): qty 1

## 2013-07-31 MED ORDER — LATANOPROST 0.005 % OP SOLN
1.0000 [drp] | Freq: Every day | OPHTHALMIC | Status: DC
  Administered 2013-08-01 (×2): 1 [drp] via OPHTHALMIC
  Filled 2013-07-31: qty 2.5

## 2013-07-31 MED ORDER — ADULT MULTIVITAMIN W/MINERALS CH
1.0000 | ORAL_TABLET | Freq: Every day | ORAL | Status: DC
  Administered 2013-08-01 – 2013-08-02 (×2): 1 via ORAL
  Filled 2013-07-31 (×2): qty 1

## 2013-07-31 MED ORDER — IMIPRAMINE HCL 50 MG PO TABS
100.0000 mg | ORAL_TABLET | Freq: Every day | ORAL | Status: DC
  Administered 2013-08-01 (×2): 100 mg via ORAL
  Filled 2013-07-31 (×3): qty 2

## 2013-07-31 MED ORDER — GABAPENTIN 300 MG PO CAPS
600.0000 mg | ORAL_CAPSULE | Freq: Three times a day (TID) | ORAL | Status: DC
  Administered 2013-08-01 – 2013-08-02 (×5): 600 mg via ORAL
  Filled 2013-07-31 (×7): qty 2

## 2013-07-31 MED ORDER — INFLUENZA VAC SPLIT QUAD 0.5 ML IM SUSP
0.5000 mL | INTRAMUSCULAR | Status: AC
  Administered 2013-08-01: 0.5 mL via INTRAMUSCULAR
  Filled 2013-07-31 (×2): qty 0.5

## 2013-07-31 MED ORDER — ACETAMINOPHEN 325 MG PO TABS: 650.0000 mg | ORAL_TABLET | Freq: Four times a day (QID) | ORAL | Status: DC | PRN

## 2013-07-31 MED ORDER — POTASSIUM CHLORIDE CRYS ER 20 MEQ PO TBCR
40.0000 meq | EXTENDED_RELEASE_TABLET | Freq: Once | ORAL | Status: AC
  Administered 2013-07-31: 40 meq via ORAL
  Filled 2013-07-31: qty 2

## 2013-07-31 MED ORDER — HYDROCODONE-ACETAMINOPHEN 5-325 MG PO TABS
1.0000 | ORAL_TABLET | ORAL | Status: DC | PRN
  Administered 2013-08-01 (×3): 2 via ORAL
  Filled 2013-07-31 (×3): qty 2

## 2013-07-31 MED ORDER — ENOXAPARIN SODIUM 40 MG/0.4ML ~~LOC~~ SOLN
40.0000 mg | SUBCUTANEOUS | Status: DC
  Administered 2013-08-01 (×2): 40 mg via SUBCUTANEOUS
  Filled 2013-07-31 (×3): qty 0.4

## 2013-07-31 MED ORDER — OXYCODONE-ACETAMINOPHEN 5-325 MG PO TABS
2.0000 | ORAL_TABLET | Freq: Once | ORAL | Status: AC
  Administered 2013-07-31: 2 via ORAL
  Filled 2013-07-31: qty 2

## 2013-07-31 MED ORDER — DARIFENACIN HYDROBROMIDE ER 15 MG PO TB24
15.0000 mg | ORAL_TABLET | Freq: Every day | ORAL | Status: DC
  Administered 2013-08-01 – 2013-08-02 (×2): 15 mg via ORAL
  Filled 2013-07-31 (×2): qty 1

## 2013-07-31 MED ORDER — ACETAMINOPHEN 650 MG RE SUPP: 650.0000 mg | Freq: Four times a day (QID) | RECTAL | Status: DC | PRN

## 2013-07-31 MED ORDER — ZOLPIDEM TARTRATE 10 MG PO TABS: 10.0000 mg | ORAL_TABLET | Freq: Every evening | ORAL | Status: DC | PRN

## 2013-07-31 MED ORDER — GABAPENTIN 600 MG PO TABS
600.0000 mg | ORAL_TABLET | Freq: Three times a day (TID) | ORAL | Status: DC
  Filled 2013-07-31: qty 1

## 2013-07-31 MED ORDER — SODIUM CHLORIDE 0.9 % IV SOLN
INTRAVENOUS | Status: DC
  Administered 2013-08-01 – 2013-08-02 (×4): via INTRAVENOUS

## 2013-07-31 MED ORDER — AMANTADINE HCL 100 MG PO CAPS
100.0000 mg | ORAL_CAPSULE | Freq: Two times a day (BID) | ORAL | Status: DC
  Administered 2013-08-01 – 2013-08-02 (×4): 100 mg via ORAL
  Filled 2013-07-31 (×5): qty 1

## 2013-07-31 MED ORDER — TRIAMCINOLONE ACETONIDE 0.1 % EX CREA
1.0000 "application " | TOPICAL_CREAM | Freq: Two times a day (BID) | CUTANEOUS | Status: DC
  Administered 2013-08-01 – 2013-08-02 (×3): 1 via TOPICAL
  Filled 2013-07-31: qty 15

## 2013-07-31 MED ORDER — PNEUMOCOCCAL VAC POLYVALENT 25 MCG/0.5ML IJ INJ
0.5000 mL | INJECTION | INTRAMUSCULAR | Status: AC
  Administered 2013-08-01: 0.5 mL via INTRAMUSCULAR
  Filled 2013-07-31 (×2): qty 0.5

## 2013-07-31 NOTE — ED Notes (Signed)
Per EMS- Patient has had large blisters on bilateral lower extremities x 3 days and some are open. Patient has a history of MS and is wheelchair bound.

## 2013-07-31 NOTE — ED Provider Notes (Signed)
CSN: [id number]     Arrival date & time 07/31/13  1343 History   First MD Initiated Contact with Patient 07/31/13 1359     Chief Complaint  Patient presents with  . open areas on lower extremities    (Consider location/radiation/quality/duration/timing/severity/associated sxs/prior Treatment) HPI Shannon Garrett is a 53 y.o. female with known history of multiple sclerosis, nonischemic cardiomyopathy and chronic anemia was brought to the ER by EMS for BL LE Bullae on legs x3 days. Patient states that she has had bullous erptions previously, but never to this extent. She c/o pain in her legs and chills. She denies any change in lotions, soaps, or detergents. No new medications.  The patient is having no social problems her Medicaid was cut off.  She is unable to get around her house without her shoulder.  She complains that she is having difficulty taking care of herself and cleaning ADLs.  Patient appears disheveled.Denies fevers, chills, myalgias, arthralgias. Denies DOE, SOB, chest tightness or pressure, radiation to left arm, jaw or back, or diaphoresis. Denies dysuria, flank pain, suprapubic pain, frequency, urgency, or hematuria. Denies headaches, light headedness, weakness, visual disturbances. Denies abdominal pain, nausea, vomiting, diarrhea or constipation.    Past Medical History  Diagnosis Date  . MS (multiple sclerosis)     a. Dx'd late 20's. b. Tx with Novantrone, Tysabri, Copaxone previously.  Marland Kitchen Hypertension   . Depression   . Glaucoma   . Cardiomyopathy     a.  Echo 04/29/12: Mild LVH, EF 20-25%, mild AI, moderate MR, moderate LAE, mild RAE, mild RVE, moderate TR, PASP 51, small pericardial effusion;   b. probably non-ischemic given multiple chemo-Tx agents used for MS and global LV dysfn on echo  . Chronic systolic heart failure   . CHF (congestive heart failure)   . Shortness of breath   . Neuromuscular disorder     MS   Past Surgical History  Procedure Laterality Date  .  Ablation      uterine  . Cesarean section     Family History  Problem Relation Age of Onset  . Hypertension Mother    History  Substance Use Topics  . Smoking status: Never Smoker   . Smokeless tobacco: Never Used  . Alcohol Use: No   OB History   Grav Para Term Preterm Abortions TAB SAB Ect Mult Living                 Review of Systems Ten systems reviewed and are negative for acute change, except as noted in the HPI.   Allergies  Sulfonamide derivatives and Penicillins  Home Medications   Current Outpatient Rx  Name  Route  Sig  Dispense  Refill  . amantadine (SYMMETREL) 100 MG capsule   Oral   Take 100 mg by mouth 2 (two) times daily.          . carvedilol (COREG) 6.25 MG tablet   Oral   Take 6.25 mg by mouth 2 (two) times daily with a meal.         . gabapentin (NEURONTIN) 600 MG tablet   Oral   Take 600 mg by mouth 3 (three) times daily.          Marland Kitchen HYDROcodone-acetaminophen (NORCO/VICODIN) 5-325 MG per tablet   Oral   Take 1-2 tablets by mouth every 4 (four) hours as needed.   10 tablet   0   . imipramine (TOFRANIL) 50 MG tablet   Oral   Take  100 mg by mouth at bedtime.         . Multiple Vitamin (MULTIVITAMIN WITH MINERALS) TABS tablet   Oral   Take 1 tablet by mouth daily.         . natalizumab (TYSABRI) 300 MG/15ML injection   Intravenous   Inject into the vein. 07/08/13 - for chemo         . oxyCODONE-acetaminophen (PERCOCET/ROXICET) 5-325 MG per tablet   Oral   Take 1 tablet by mouth every 6 (six) hours as needed.   20 tablet   0   . solifenacin (VESICARE) 10 MG tablet   Oral   Take 10 mg by mouth 2 (two) times daily.          . Travoprost, BAK Free, (TRAVATAN) 0.004 % SOLN ophthalmic solution   Both Eyes   Place 1 drop into both eyes at bedtime.          . triamcinolone cream (KENALOG) 0.1 %   Topical   Apply 1 application topically as needed (inflammation, itching).          . zolpidem (AMBIEN) 10 MG tablet    Oral   Take 1 tablet (10 mg total) by mouth at bedtime as needed for sleep.   30 tablet   5    BP 123/76  Pulse 82  Temp(Src) 97.8 F (36.6 C) (Oral)  Resp 16  SpO2 100% Physical Exam Physical Exam  Nursing note and vitals reviewed. Constitutional: She is oriented to person, place, and time. Appears unkempt. HENT:  Head: Normocephalic and atraumatic.  Eyes: Conjunctivae normal and EOM are normal. Pupils are equal, round, and reactive to light. No scleral icterus.  Neck: Normal range of motion.  Cardiovascular: Normal rate, regular rhythm and normal heart sounds.  Exam reveals no gallop and no friction rub.   No murmur heard. Pulmonary/Chest: Effort normal and breath sounds normal. No respiratory distress.  Abdominal: Soft. Bowel sounds are normal. She exhibits no distension and no mass. There is no tenderness. There is no guarding.  Neurological: She is alert and oriented to person, place, and time.  Skin: Largte bollous erruptions , 1 open  On the R distal extremity, 1 small bulla on the left leg. No signs of secondary infection.   ED Course  Procedures (including critical care time) Labs Review Labs Reviewed - No data to display Imaging Review No results found.  EKG Interpretation   None       MDM  No diagnosis found. Patient here for painful bullous eruption of the lower extrepity  No signs of secondary infection. Lab work pending.  Patient seen in shared visit with Dr. Eulis Foster. She has been unable to take care of herself at home. She is unable to ambulate and has been unable to take care of herself. She statesthat her medicaid ran out and no one is coming to help her.    I feel the patient needs inpatient admission for placement due to her inability to care for herself at home.  Patient is agreeable. i have givne the patient to [facility] in care handoff  Margarita Mail, PA-C 07/31/13 1606

## 2013-07-31 NOTE — ED Provider Notes (Signed)
  Face-to-face evaluation   History: She presents for evaluation of blisters on her legs.   Physical exam: Alert, elderly, appearing female in no apparent distress. She is eating a sandwich. Legs moderately swollen bilaterally with a few superficial blisters of the right leg, partially denuded. No significant drainage. No significant erythema.  Medical screening examination/treatment/procedure(s) were conducted as a shared visit with non-physician practitioner(s) and myself.  I personally evaluated the patient during the encounter  Richarda Blade, MD 08/03/13 2029

## 2013-07-31 NOTE — H&P (Signed)
[facility] History and Physical  Shannon Garrett KGY:[id number] DOB: 1960-09-22 DOA: 07/31/2013  Referring physician: PCP: Shannon Chou, NP  Specialists:  Chief Complaint: Noncompliance, failure to thrive  HPI: Shannon Garrett is a 53 y.o. BF PMHx  multiple sclerosis progressive/relapsing (treated with Novantrone, Tysabri, Copaxone Symmetrel and Dimethyl Fumerate) scooter-bound Systolic CHF, HTN, nonischemic cardiomyopathy, chronic anemia presents with bullous eruption on her leg x3 days. Patient reports she is unable to care for herself at home. She is refusing SNF. She is unable to ambulate, complete her ADLs and has not fed herself in 2 days. Patient recently fired by [facility] secondary to noncompliance. Patient has been disenrolled from [facility] secondary to failure to provide proper paperwork.   Review of Systems: The patient denies anorexia, fever, weight loss,, vision loss, decreased hearing, hoarseness, chest pain, syncope, dyspnea on exertion, peripheral edema, balance deficits, hemoptysis, abdominal pain, melena, hematochezia, severe indigestion/heartburn, hematuria, incontinence, genital sores, muscle weakness, suspicious skin lesions, transient blindness, difficulty walking, depression, unusual weight change, abnormal bleeding, enlarged lymph nodes, angioedema, and breast masses.    TRAVEL HISTORY: None   Procedure Echocardiogram 09/18/2012 - Left ventricle: mild LVH. Systolic function was normal. -LVEF= 50% to 55%. - (grade 1 diastolic dysfunction). - Aortic valve: Mild regurgitation. - Mitral valve: Calcified annulus. - Left atrium: The atrium was mildly dilated.   Antibiotics   Consultation Psychiatry pending   Past Medical History  Diagnosis Date  . MS (multiple sclerosis)     a. Dx'd late 20's. b. Tx with Novantrone, Tysabri, Copaxone previously.  Marland Kitchen Hypertension   . Depression   . Glaucoma   . Cardiomyopathy     a.  Echo 04/29/12: Mild  LVH, EF 20-25%, mild AI, moderate MR, moderate LAE, mild RAE, mild RVE, moderate TR, PASP 51, small pericardial effusion;   b. probably non-ischemic given multiple chemo-Tx agents used for MS and global LV dysfn on echo  . Chronic systolic heart failure   . CHF (congestive heart failure)   . Shortness of breath   . Neuromuscular disorder     MS   Past Surgical History  Procedure Laterality Date  . Ablation      uterine  . Cesarean section     Social History:  reports that she has never smoked. She has never used smokeless tobacco. She reports that she does not drink alcohol or use illicit drugs. where does patient live--home, ALF, SNF? Home alone  Can patient participate in ADLs? No  Allergies  Allergen Reactions  . Sulfonamide Derivatives Hives and Shortness Of Breath  . Penicillins Hives and Itching    Family History  Problem Relation Age of Onset  . Hypertension Mother     Prior to Admission medications   Medication Sig Start Date End Date Taking? Authorizing Provider  amantadine (SYMMETREL) 100 MG capsule Take 100 mg by mouth 2 (two) times daily.    Yes Historical Provider, MD  carvedilol (COREG) 6.25 MG tablet Take 6.25 mg by mouth 2 (two) times daily with a meal.   Yes Historical Provider, MD  gabapentin (NEURONTIN) 600 MG tablet Take 600 mg by mouth 3 (three) times daily.    Yes Historical Provider, MD  HYDROcodone-acetaminophen (NORCO/VICODIN) 5-325 MG per tablet Take 1-2 tablets by mouth every 4 (four) hours as needed. 05/25/13  Yes Illene Labrador, PA-C  imipramine (TOFRANIL) 50 MG tablet Take 100 mg by mouth at bedtime.   Yes Historical Provider, MD  Multiple Vitamin (MULTIVITAMIN WITH MINERALS)  TABS tablet Take 1 tablet by mouth daily.   Yes Historical Provider, MD  natalizumab (TYSABRI) 300 MG/15ML injection Inject into the vein. 07/08/13 - for chemo   Yes Historical Provider, MD  oxyCODONE-acetaminophen (PERCOCET/ROXICET) 5-325 MG per tablet Take 1 tablet by mouth  every 6 (six) hours as needed. 05/26/13  Yes Erline Hau, MD  solifenacin (VESICARE) 10 MG tablet Take 10 mg by mouth 2 (two) times daily.    Yes Historical Provider, MD  Travoprost, BAK Free, (TRAVATAN) 0.004 % SOLN ophthalmic solution Place 1 drop into both eyes at bedtime.    Yes Historical Provider, MD  triamcinolone cream (KENALOG) 0.1 % Apply 1 application topically as needed (inflammation, itching).  04/28/13  Yes Historical Provider, MD  zolpidem (AMBIEN) 10 MG tablet Take 1 tablet (10 mg total) by mouth at bedtime as needed for sleep. 06/09/13  Yes Estill Dooms, MD   Physical Exam: Filed Vitals:   07/31/13 1411 07/31/13 1812 07/31/13 1837  BP: 123/76 140/76   Pulse: 82 81   Temp: 97.8 F (36.6 C) 97.8 F (36.6 C)   TempSrc: Oral    Resp: 16    Height:   5\' 4"  (1.626 m)  Weight:   95.5 kg (210 lb 8.6 oz)  SpO2: 100% 99%      General:  A./O. x4, NAD, poor grasp of medical condition  Eyes: Pupils equal react to light and accommodation  Neck: Negative JVD, negative lymphadenopathy  Cardiovascular: Regular rhythm and rate, negative murmurs rubs or gallops, DP/PT pulse +1 by the  Respiratory: Clear to auscultation bilateral  Abdomen: Moderately obese, nontender, nondistended, plus bowel sound  Skin: Multiple large blisters on the right lower extremity, one has ruptured within last 24 hours, small healing blister lateral aspect of left lower extremity  Musculoskeletal: +1 pedal edema bilateral  Psychiatric: Poor understanding of disease process, poor lifestyle choices   Neurologic: Cranial nerves II through XII intact, tongue/uvula midline, upper stringy strength 5/5, lower extremity strength 2/5, diminished sensation lower kidneys, sensation intact waist up. Did not ambulate patient  Labs on Admission:  Basic Metabolic Panel:  Recent Labs Lab 07/31/13 1532  NA 143  K 3.3*  CL 97  CO2 35*  GLUCOSE 116*  BUN 9  CREATININE 1.14*  CALCIUM 10.1    Liver Function Tests: No results found for this basename: AST, ALT, ALKPHOS, BILITOT, PROT, ALBUMIN,  in the last 168 hours No results found for this basename: LIPASE, AMYLASE,  in the last 168 hours No results found for this basename: AMMONIA,  in the last 168 hours CBC:  Recent Labs Lab 07/31/13 1532  WBC 6.2  NEUTROABS 3.1  HGB 11.1*  HCT 33.4*  MCV 81.3  PLT 272   Cardiac Enzymes: No results found for this basename: CKTOTAL, CKMB, CKMBINDEX, TROPONINI,  in the last 168 hours  BNP (last 3 results)  Recent Labs  12/15/12 1639 05/20/13 1430 05/24/13 2215  PROBNP 15.0 5.0 296.7*   CBG: No results found for this basename: GLUCAP,  in the last 168 hours  Radiological Exams on Admission: No results found.  EKG: Independently reviewed.   Assessment/Plan Active Problems:   MULTIPLE SCLEROSIS, PROGRESSIVE/RELAPSING   HYPERTENSION, BENIGN ESSENTIAL   Chronic systolic heart failure   Paraplegia   Rhabdomyolysis   FTT (failure to thrive) in adult   Medically noncompliant   Failure to thrive   Blisters of multiple sites  Failure to thrive -NCM Joellyn Quails and PA Orvil Feil  explained at length to patient that she will be discharged in the a.m. regardless of if another home health agency will accept her. -NCM Joellyn Quails and PA Orvil Feil setup a telemetry psychiatry consultation to evaluate for competency  HTN -Continue home medication  Combined Systolic and diastolic CHF -Continue home medication  Multiple sclerosis progressive/relapsing -Continue home medication -Patient's neurologist is Dr. Delphia Grates in [place], will research records in the a.m. for previous visits  Paraplegia -Per patient chronic however do not have ready access to Dr. Delphia Grates (nephrologist), records  Medically noncompliant -Discussed at length a sequela of continuing to be noncompliant to include infection, death  Blisters -Discussed wound care with RN, blisters and not infected  so will use wet to dry, Telfa, antibiotic cream.   Code Status: Full Family Communication: No family available Disposition Plan: Discharge in a.m. after meeting with CSW and NCM  Time spent: 90 minutes  Allie Bossier [facility] Pager [phone]  If 7PM-7AM, please contact night-coverage [email or web address] Password [email or web address] 07/31/2013, 8:15 PM

## 2013-07-31 NOTE — ED Notes (Signed)
Patient refuses to change into a hospital gown. Patient asked several times dueing her course of stay in the ED.

## 2013-07-31 NOTE — Progress Notes (Addendum)
CARE MANAGEMENT ED NOTE 07/31/2013  Patient:  Shannon Garrett, Shannon Garrett   Account Number:  [id number]  Date Initiated:  07/31/2013  Documentation initiated by:  Jackelyn Poling  Subjective/Objective Assessment:   53 yr old [employer] pt largefluid filled blisters on BLE x 3 days & some open. Hx of MS & w/c bound [place] provided HHRN/PT/OT/aide per Kathline Magic at 1423 (referral obtained from [facility] in mid 06/2013) wbc wnl     Subjective/Objective Assessment Detail:   Jackelyn Poling of [facility] reports her staff states the pt lives alone pcp is julie Sales promotion account executive from [facility] staff that pt gets in her chair and does not move, with various decubitus HHRN sent pt to ED 07/31/13  1655 ED Cm received a call from [place] stating [place] Administrator states [facility] would not be receiving pt back as a client r/t potential injury to [facility] employees Pt was lowered to floor 07/31/13 when pt was about to fall  1700 ED PA Jarrett Soho stated EDP Eulis Foster has request admission for pt  Pt chooses [facility], [facility] (had both before) and [facility] for CM to send referrals on 07/31/13  Pt not old enough for PACE per pt previous attempt Pt states she does not using SCAT services but has access to them (completed SCAT application) "I like to be alone" response to adult care centers.  "I can not afford" response to private duty nursing (PDN) Lost medicaid services when did not re certify on time while in snf Has completed forms after 07/02/13 now pending 45 days for medicaid responses (possible response mid 02/15) Had a ? CAP "aide" previously  Has made attempt to get a room mate without success per pt. Denies support from family, friends or church members     Action/Plan:   ED CM spoke with Debbie at [facility] x 2 and left message once informing her of possible d/c home Unable to find outstanding abnormal labs/reason for admission after speaking with ED PA/NP   Action/Plan Detail:   CM updated Jarrett Soho ED PA/NP  about [facility] response & admission status.  CM spoke with pt to assess her, to discuss d/c of [facility] services, Offered pt choice of home health agencies, Left her with  list, Offered PACE, SCAT, PDN, [facility] referral   Anticipated DC Date:  08/01/2013     Status Recommendation to Physician:   Result of Recommendation:    Other ED Services  Consult [facility] [place]  Other  Outpatient Services - Pt will follow up   [place]   Choice offered to / List presented to:  [name]     [place] arranged  HH-2 PT  HH-1 RN  [place]    Status of service:  Completed, signed off  ED Comments:  CM recommended x 2 to pt that she consider short term rehab at an ALF or snf. Pt informed CM she is aware from Dr Aris Lot that "No one can make me go to a nursing home if I don't want to go"  ED Comments Detail:  07/31/13 1808 Cm spoke with Dr Sherral Hammers, [facility] to review pt plan of care Cm discussed with Dr Sherral Hammers the conversations with [facility] concluding with them not receiving her a client. Discussed resources offered to  pt with denial of various resources. Discussed pt stating she would not go to a facility Discussed pt informing CM that Dr Aris Lot came to her home to complete competency and asked me a long list of questions" concluding with pt being found competent. Discussed need for further competency testing via telepsych (IVC if found incompetent), admission status and d/c status. Conclusion ED PA/NP agreed to order telepsych to assist with a competency check.  CM reviewed in details medicare guidelines, home health [facility]) (length of stay in home, types of [facility] staff available, coverage, primary caregiver, up to 24 hrs before services may be started), Private duty nursing (PDN-coverage, length of stay in the home types of staff  available), assisted living (ASL- coverage, services offered) and Skilled nursing facilities (snf- coverage and services offered)  CM reviewed availability of HH SW to assist pcp to get pt to snf (if desired disposition) from the community level. CM provided pt with a list of [place] & PDN.  Dr Aris Lot is not a [facility] associated provider, therefore these services possibly not available but [facility] evaluation/referral requested of [facility] staff  Confirmation faxes received at 1908, [place] for [facility], [facility], [facility] respectively Sent face sheet, [place] orders (no face to face yet) & ED PA progress notes

## 2013-07-31 NOTE — ED Provider Notes (Signed)
Care assumed from [place], [place].  Kalynne Womac is a 53 y.o. female with Hx of MS, CHF, HTN, nonischemic cardiomyopathy, chronic anemia presents with bullous eruption on her leg x3 days.  Patient reports she is unable to care for herself at home. She is refusing SNF.  She is unable to ambulate, complete her ADLs and has not fed herself in 2 days.  Maudie Mercury has been consulted and is working with the case for placement.    Face to face Exam:   General: Awake, unkempt  HEENT: Atraumatic  Resp: Normal effort, clear and equal breath sounds Abd: Nondistended, nontender  Neuro: Unable to move her legs, moves upper extremities without difficulty  Lymph: No adenopathy Skin: Several large bulla noted to the medial right lower leg, 2 intact and 1 ruptured; 1 ruptured gallbladder the left lower leg; no erythema, induration or evidence of abscess    5:02 PM Discussed the case with Maudie Mercury, caseworker who reports that patient's home health will no longer be willing to care for her.    Labs largely unremarkable. Mild hypokalemia placed in the department. Mild elevation of serum creatinine to 1.14 from 0.9.  No evidence of infection around the bulla on her legs.  No evidence of urinary tract infection.  Discussed the patient again with Dr. Eulis Foster he reports that patient is unable to care for herself and therefore will need medical admission for failure to thrive.  5:37 PM Discussed with Dr. Sherral Hammers who will admit for obs and discussion of placement.     Jarrett Soho Makaila Windle, PA-C 07/31/13 1737

## 2013-07-31 NOTE — ED Provider Notes (Signed)
Medical screening examination/treatment/procedure(s) were performed by non-physician practitioner and as supervising physician I was immediately available for consultation/collaboration.    Rolland Porter, MD, Abram Sander   Janice Norrie, MD 07/31/13 [phone]

## 2013-07-31 NOTE — Progress Notes (Deleted)
CM reviewed in details medicare guidelines, home health [facility]) (length of stay in home, types of [facility] staff available, coverage, primary caregiver, up to 24 hrs before services may be started), Private duty nursing (PDN-coverage, length of stay in the home types of staff available), assisted living (ASL- coverage, services offered) and Skilled nursing facilities (snf- coverage and services offered)  CM reviewed availability of HH SW to assist pcp to get pt to snf (if desired disposition) from the community level. CM provided family with a list of [place] home health agencies, PDN, and snfs.   Discussed pt to be further evaluated by unit therapists (PT/OT) for recommendation of level of care and share this with attending MD and unit CM

## 2013-07-31 NOTE — Progress Notes (Signed)
07/31/2013 A. Joseandres Mazer RNCM 1947pm EDCM spoke to [place] of [facility] at phone number [phone].  EDCM explained that patient needs telepsych for competency.  As per Marijean Bravo, they do not perform telepsych on the floor/unit only in the ED.  Ford explained to [facility] that patient will be marked as a regular psych consult and someone will see the patient tomorrow.  Dr. Sherral Hammers made aware.  No furhter EDCM needs at this time.

## 2013-08-01 DIAGNOSIS — I509 Heart failure, unspecified: Secondary | ICD-10-CM

## 2013-08-01 DIAGNOSIS — I5042 Chronic combined systolic (congestive) and diastolic (congestive) heart failure: Secondary | ICD-10-CM

## 2013-08-01 DIAGNOSIS — E876 Hypokalemia: Secondary | ICD-10-CM

## 2013-08-01 LAB — CBC WITH DIFFERENTIAL/PLATELET
BASOS ABS: 0.1 10*3/uL (ref 0.0–0.1)
Basophils Relative: 1 % (ref 0–1)
EOS PCT: 8 % — AB (ref 0–5)
Eosinophils Absolute: 0.5 10*3/uL (ref 0.0–0.7)
HCT: 30.2 % — ABNORMAL LOW (ref 36.0–46.0)
HEMOGLOBIN: 9.8 g/dL — AB (ref 12.0–15.0)
Lymphocytes Relative: 35 % (ref 12–46)
Lymphs Abs: 2.1 10*3/uL (ref 0.7–4.0)
MCH: 26.4 pg (ref 26.0–34.0)
MCHC: 32.5 g/dL (ref 30.0–36.0)
MCV: 81.4 fL (ref 78.0–100.0)
MONO ABS: 0.5 10*3/uL (ref 0.1–1.0)
MONOS PCT: 9 % (ref 3–12)
NEUTROS ABS: 2.8 10*3/uL (ref 1.7–7.7)
Neutrophils Relative %: 47 % (ref 43–77)
Platelets: 216 10*3/uL (ref 150–400)
RBC: 3.71 MIL/uL — ABNORMAL LOW (ref 3.87–5.11)
RDW: 15.3 % (ref 11.5–15.5)
WBC: 6 10*3/uL (ref 4.0–10.5)

## 2013-08-01 LAB — COMPREHENSIVE METABOLIC PANEL
ALBUMIN: 2.7 g/dL — AB (ref 3.5–5.2)
ALK PHOS: 103 U/L (ref 39–117)
ALT: 15 U/L (ref 0–35)
AST: 18 U/L (ref 0–37)
BUN: 11 mg/dL (ref 6–23)
CO2: 32 mEq/L (ref 19–32)
Calcium: 8.9 mg/dL (ref 8.4–10.5)
Chloride: 98 mEq/L (ref 96–112)
Creatinine, Ser: 1.19 mg/dL — ABNORMAL HIGH (ref 0.50–1.10)
GFR calc Af Amer: 61 mL/min — ABNORMAL LOW (ref >90)
GFR calc non Af Amer: 52 mL/min — ABNORMAL LOW (ref >90)
Glucose, Bld: 121 mg/dL — ABNORMAL HIGH (ref 70–99)
Potassium: 2.9 mEq/L — CL (ref 3.7–5.3)
Sodium: 141 mEq/L (ref 137–147)
TOTAL PROTEIN: 5.7 g/dL — AB (ref 6.0–8.3)
Total Bilirubin: <0.2 mg/dL — ABNORMAL LOW (ref 0.3–1.2)

## 2013-08-01 LAB — POTASSIUM: Potassium: 3.3 mEq/L — ABNORMAL LOW (ref 3.7–5.3)

## 2013-08-01 LAB — MAGNESIUM: MAGNESIUM: 1.7 mg/dL (ref 1.5–2.5)

## 2013-08-01 MED ORDER — POTASSIUM CHLORIDE CRYS ER 20 MEQ PO TBCR
60.0000 meq | EXTENDED_RELEASE_TABLET | Freq: Two times a day (BID) | ORAL | Status: DC
  Administered 2013-08-01 – 2013-08-02 (×3): 60 meq via ORAL
  Filled 2013-08-01 (×4): qty 3

## 2013-08-01 NOTE — Progress Notes (Signed)
Chart review.  Per psych MD, Pt has capacity to make her own decisions.  Per RN, Pt to d/c home tomorrow with [facility].  Notified RN.  Bernita Raisin, [place] Work [phone]

## 2013-08-01 NOTE — Discharge Summary (Addendum)
Physician Discharge Summary  Zeta Bucy WUJ:[id number] DOB: January 20, 1961 DOA: 07/31/2013  PCP: Alvester Chou, NP  Admit date: 07/31/2013 Discharge date: 08/01/2013  Time spent: 40 minutes  Recommendations for Outpatient Follow-up:  Failure to thrive  -NCM Joellyn Quails and PA Orvil Feil explained at length to patient that she will be discharged on 1/31  regardless of if another home health agency will accept her. ADDENDUM; advanced home care has agreed to accept patient -NCM Joellyn Quails and PA Orvil Feil setup a telemetry psychiatry consultation to evaluate for competency  -Seen this a.m. by [facility] (psychiatrist); and ruled competent. Discharge home  HTN  -Continue home medication   Hypokalemia -Resolved with administration of K-Dur 42meq   Combined Systolic and diastolic CHF  -Continue home medication   Multiple sclerosis progressive/relapsing  -Continue home medication  -Patient's neurologist is Dr. Delphia Grates in [place], unable to view records through care everywhere  -Home health care, physical therapy arranged for patient  Paraplegia  -Per patient chronic however do not have ready access to Dr. Delphia Grates (neurologist), records  -Instructed to followup with Dr. Delphia Grates (neurologist), one week post discharge  Medically noncompliant  -Discussed at length sequela of continuing to be noncompliant to include infection, death  -Patient refuses recommendation of SNF made by all medical staff members during this visit  Blisters  -Discussed wound care with RN, blisters and not infected so will use wet to dry, Telfa, antibiotic cream. -Patient's blisters were dressed on 1/30, ready for discharge     Discharge Diagnoses:  Active Problems:   MULTIPLE SCLEROSIS, PROGRESSIVE/RELAPSING   HYPERTENSION, BENIGN ESSENTIAL   Chronic systolic heart failure   Paraplegia   Rhabdomyolysis   FTT (failure to thrive) in adult   Medically noncompliant   Failure to  thrive   Blisters of multiple sites   Discharge Condition: Stable  Diet recommendation: Heart healthy  Filed Weights   07/31/13 1837  Weight: 95.5 kg (210 lb 8.6 oz)    History of present illness:  Shannon Garrett Anamika Kueker is a 53 y.o. BF PMHx multiple sclerosis progressive/relapsing (treated with Novantrone, Tysabri, Copaxone Symmetrel and Dimethyl Fumerate) scooter-bound  Systolic CHF, HTN, nonischemic cardiomyopathy, chronic anemia presents with bullous eruption on her leg x3 days. Patient reports she is unable to care for herself at home. She is refusing SNF. She is unable to ambulate, complete her ADLs and has not fed herself in 2 days. Patient recently fired by [employer] [facility] secondary to noncompliance. Patient has been disenrolled from [facility] secondary to failure to provide proper paperwork. 1/31 patient sitting in bed comfortably eating her Demands to be discharged 2/1 no events overnight ready for discharge   Procedures: Echocardiogram 09/18/2012  - Left ventricle: mild LVH. Systolic function was normal.  -LVEF= 50% to 55%.  - (grade 1 diastolic dysfunction). - Aortic valve: Mild regurgitation. - Mitral valve: Calcified annulus. - Left atrium: The atrium was mildly dilated.   Consultations: DrJONNALAGADDA,JANARDHAHA R (psychiatrist);   Antibiotics    Discharge Exam: Filed Vitals:   07/31/13 1812 07/31/13 1837 07/31/13 2121 08/01/13 0534  BP: 140/76  118/69 121/69  Pulse: 81  88 103  Temp: 97.8 F (36.6 C)  98 F (36.7 C) 98.2 F (36.8 C)  TempSrc:   Oral Oral  Resp:   20 20  Height:  5\' 4"  (1.626 m)    Weight:  95.5 kg (210 lb 8.6 oz)    SpO2: 99%  100% 100%   General: A./O. x4, NAD, poor grasp  of medical condition  Neck: Negative JVD, negative lymphadenopathy  Cardiovascular: Regular rhythm and rate, negative murmurs rubs or gallops, DP/PT pulse +1 by the  Respiratory: Clear to auscultation bilateral  Abdomen: Moderately obese, nontender, nondistended,  plus bowel sound  Skin: Multiple large blisters on the right lower extremity, one has ruptured within last 24 hours, small healing blister lateral aspect of left lower extremity; have all been cleaned covered and dressed  Musculoskeletal: +1 pedal edema bilateral  Discharge Instructions     Medication List    ASK your doctor about these medications       amantadine 100 MG capsule  Commonly known as:  SYMMETREL  Take 100 mg by mouth 2 (two) times daily.     carvedilol 6.25 MG tablet  Commonly known as:  COREG  Take 6.25 mg by mouth 2 (two) times daily with a meal.     gabapentin 600 MG tablet  Commonly known as:  NEURONTIN  Take 600 mg by mouth 3 (three) times daily.     HYDROcodone-acetaminophen 5-325 MG per tablet  Commonly known as:  NORCO/VICODIN  Take 1-2 tablets by mouth every 4 (four) hours as needed.     imipramine 50 MG tablet  Commonly known as:  TOFRANIL  Take 100 mg by mouth at bedtime.     multivitamin with minerals Tabs tablet  Take 1 tablet by mouth daily.     natalizumab 300 MG/15ML injection  Commonly known as:  TYSABRI  Inject into the vein. 07/08/13 - for chemo     oxyCODONE-acetaminophen 5-325 MG per tablet  Commonly known as:  PERCOCET/ROXICET  Take 1 tablet by mouth every 6 (six) hours as needed.     solifenacin 10 MG tablet  Commonly known as:  VESICARE  Take 10 mg by mouth 2 (two) times daily.     Travoprost (BAK Free) 0.004 % Soln ophthalmic solution  Commonly known as:  TRAVATAN  Place 1 drop into both eyes at bedtime.     triamcinolone cream 0.1 %  Commonly known as:  KENALOG  Apply 1 application topically as needed (inflammation, itching).     zolpidem 10 MG tablet  Commonly known as:  AMBIEN  Take 1 tablet (10 mg total) by mouth at bedtime as needed for sleep.       Allergies  Allergen Reactions  . Sulfonamide Derivatives Hives and Shortness Of Breath  . Penicillins Hives and Itching      The results of significant  diagnostics from this hospitalization (including imaging, microbiology, ancillary and laboratory) are listed below for reference.    Significant Diagnostic Studies: No results found.  Microbiology: No results found for this or any previous visit (from the past 240 hour(s)).   Labs: Basic Metabolic Panel:  Recent Labs Lab 07/31/13 1532 08/01/13 0543  NA 143 141  K 3.3* 2.9*  CL 97 98  CO2 35* 32  GLUCOSE 116* 121*  BUN 9 11  CREATININE 1.14* 1.19*  CALCIUM 10.1 8.9  MG  --  1.7   Liver Function Tests:  Recent Labs Lab 08/01/13 0543  AST 18  ALT 15  ALKPHOS 103  BILITOT <0.2*  PROT 5.7*  ALBUMIN 2.7*   No results found for this basename: LIPASE, AMYLASE,  in the last 168 hours No results found for this basename: AMMONIA,  in the last 168 hours CBC:  Recent Labs Lab 07/31/13 1532 08/01/13 0543  WBC 6.2 6.0  NEUTROABS 3.1 2.8  HGB 11.1* 9.8*  HCT 33.4* 30.2*  MCV 81.3 81.4  PLT 272 216   Cardiac Enzymes: No results found for this basename: CKTOTAL, CKMB, CKMBINDEX, TROPONINI,  in the last 168 hours BNP: BNP (last 3 results)  Recent Labs  12/15/12 1639 05/20/13 1430 05/24/13 2215  PROBNP 15.0 5.0 296.7*   CBG: No results found for this basename: GLUCAP,  in the last 168 hours     Signed:  Dia Crawford, MD [facility] [phone] pager

## 2013-08-01 NOTE — Progress Notes (Signed)
Utilization Review Completed.   Ashlin Kreps, RN, BSN Nurse Case Manager  

## 2013-08-01 NOTE — Consult Note (Signed)
Reason for Consult: Competency evaluation Referring Physician: Dr. Doloris Hall is an 53 y.o. female.  HPI: Patient was seen and chart reviewed. Patient stated her Medicaid was pending at this time. She is hoping that Medicaid will reinstate in 15 days and that she will be able to get home health care which was stopped about a month ago. Patient stated that she has no help at home, she has been tired off taking care of her self, which is making her more depressed and anxious. Patient stated that I also medical problems her swellings and bruises needed medical attention. Patient is requesting to be cared for in hospital at least for a few days. Patient stated she was in nursing home but did not like it the way stuff is treating other patients who has limited mental capacity. Patient also reported I have on my mental faculties intact and at the same time I am stubborn and does not want to be dependent on somebody else. Patient has complete knowledge about her medical conditions and mental condition and required treatment needs. Patient has a good orientation, concentration, language and abstract thinking.  Medical history colon Ziya Deol Eisa Conaway is a 53 y.o. BF PMHx multiple sclerosis progressive/relapsing (treated with Novantrone, Tysabri, Copaxone Symmetrel and Dimethyl Fumerate) scooter-bound, Systolic CHF, HTN, nonischemic cardiomyopathy, chronic anemia presents with bullous eruption on her leg x3 days. Patient reports she is unable to care for herself at home. She is refusing SNF. She is unable to ambulate, complete her ADLs and has not fed herself in 2 days. Patient recently fired by [employer] [facility] secondary to noncompliance. Patient has been disenrolled from [facility] secondary to failure to provide proper paperwork.   Review of Systems: The patient denies anorexia, fever, weight loss,, vision loss, decreased hearing, hoarseness, chest pain, syncope, dyspnea on exertion, peripheral edema,  balance deficits, hemoptysis, abdominal pain, melena, hematochezia, severe indigestion/heartburn, hematuria, incontinence, genital sores, muscle weakness, suspicious skin lesions, transient blindness, difficulty walking, depression, unusual weight change, abnormal bleeding, enlarged lymph nodes, angioedema, and breast masses.   Mental Status Examination: Patient appeared as per his stated age, fairly groomed, and has fair eye contact. Patient has a decrease in psychomotor activity because of her depression and her chronic medical condition MS. Patient has sad mood and her affect was constricted. She has normal rate, rhythm, and no volume of speech and has good language. Patient has good fund of knowledge. Her thought process is linear and goal directed. Patient has denied suicidal, homicidal ideations, intentions or plans. Patient has no evidence of auditory or visual hallucinations, delusions, and paranoia. Patient has fair insight judgment and impulse control.  Past Medical History  Diagnosis Date  . MS (multiple sclerosis)     a. Dx'd late 20's. b. Tx with Novantrone, Tysabri, Copaxone previously.  Marland Kitchen Hypertension   . Depression   . Glaucoma   . Cardiomyopathy     a.  Echo 04/29/12: Mild LVH, EF 20-25%, mild AI, moderate MR, moderate LAE, mild RAE, mild RVE, moderate TR, PASP 51, small pericardial effusion;   b. probably non-ischemic given multiple chemo-Tx agents used for MS and global LV dysfn on echo  . Chronic systolic heart failure   . CHF (congestive heart failure)   . Shortness of breath   . Neuromuscular disorder     MS    Past Surgical History  Procedure Laterality Date  . Ablation      uterine  . Cesarean section  Family History  Problem Relation Age of Onset  . Hypertension Mother     Social History:  reports that she has never smoked. She has never used smokeless tobacco. She reports that she does not drink alcohol or use illicit drugs.  Allergies:  Allergies   Allergen Reactions  . Sulfonamide Derivatives Hives and Shortness Of Breath  . Penicillins Hives and Itching    Medications: I have reviewed the patient's current medications.  Results for orders placed during the hospital encounter of 07/31/13 (from the past 48 hour(s))  CBC WITH DIFFERENTIAL     Status: Abnormal   Collection Time    07/31/13  3:32 PM      Result Value Range   WBC 6.2  4.0 - 10.5 K/uL   RBC 4.11  3.87 - 5.11 MIL/uL   Hemoglobin 11.1 (*) 12.0 - 15.0 g/dL   HCT 33.4 (*) 36.0 - 46.0 %   MCV 81.3  78.0 - 100.0 fL   MCH 27.0  26.0 - 34.0 pg   MCHC 33.2  30.0 - 36.0 g/dL   RDW 15.2  11.5 - 15.5 %   Platelets 272  150 - 400 K/uL   Neutrophils Relative % 49  43 - 77 %   Neutro Abs 3.1  1.7 - 7.7 K/uL   Lymphocytes Relative 31  12 - 46 %   Lymphs Abs 2.0  0.7 - 4.0 K/uL   Monocytes Relative 9  3 - 12 %   Monocytes Absolute 0.6  0.1 - 1.0 K/uL   Eosinophils Relative 9 (*) 0 - 5 %   Eosinophils Absolute 0.5  0.0 - 0.7 K/uL   Basophils Relative 2 (*) 0 - 1 %   Basophils Absolute 0.1  0.0 - 0.1 K/uL  BASIC METABOLIC PANEL     Status: Abnormal   Collection Time    07/31/13  3:32 PM      Result Value Range   Sodium 143  137 - 147 mEq/L   Potassium 3.3 (*) 3.7 - 5.3 mEq/L   Chloride 97  96 - 112 mEq/L   CO2 35 (*) 19 - 32 mEq/L   Glucose, Bld 116 (*) 70 - 99 mg/dL   BUN 9  6 - 23 mg/dL   Creatinine, Ser 1.14 (*) 0.50 - 1.10 mg/dL   Calcium 10.1  8.4 - 10.5 mg/dL   GFR calc non Af Amer 55 (*) >90 mL/min   GFR calc Af Amer 64 (*) >90 mL/min   Comment: (NOTE)     The eGFR has been calculated using the CKD EPI equation.     This calculation has not been validated in all clinical situations.     eGFR's persistently <90 mL/min signify possible Chronic Kidney     Disease.  URINALYSIS, ROUTINE W REFLEX MICROSCOPIC     Status: Abnormal   Collection Time    07/31/13  4:05 PM      Result Value Range   Color, Urine YELLOW  YELLOW   APPearance CLEAR  CLEAR   Specific  Gravity, Urine 1.012  1.005 - 1.030   pH 7.0  5.0 - 8.0   Glucose, UA NEGATIVE  NEGATIVE mg/dL   Hgb urine dipstick NEGATIVE  NEGATIVE   Bilirubin Urine NEGATIVE  NEGATIVE   Ketones, ur NEGATIVE  NEGATIVE mg/dL   Protein, ur NEGATIVE  NEGATIVE mg/dL   Urobilinogen, UA 0.2  0.0 - 1.0 mg/dL   Nitrite NEGATIVE  NEGATIVE   Leukocytes, UA SMALL (*)  NEGATIVE  URINE MICROSCOPIC-ADD ON     Status: Abnormal   Collection Time    07/31/13  4:05 PM      Result Value Range   Squamous Epithelial / LPF FEW (*) RARE   WBC, UA 0-2  <3 WBC/hpf  COMPREHENSIVE METABOLIC PANEL     Status: Abnormal   Collection Time    08/01/13  5:43 AM      Result Value Range   Sodium 141  137 - 147 mEq/L   Potassium 2.9 (*) 3.7 - 5.3 mEq/L   Comment: CRITICAL RESULT CALLED TO, READ BACK BY AND VERIFIED WITH:     S PICKETT AT 0702 ON 01.31.2015 BY NBROOKS   Chloride 98  96 - 112 mEq/L   CO2 32  19 - 32 mEq/L   Glucose, Bld 121 (*) 70 - 99 mg/dL   BUN 11  6 - 23 mg/dL   Creatinine, Ser 1.19 (*) 0.50 - 1.10 mg/dL   Calcium 8.9  8.4 - 10.5 mg/dL   Total Protein 5.7 (*) 6.0 - 8.3 g/dL   Albumin 2.7 (*) 3.5 - 5.2 g/dL   AST 18  0 - 37 U/L   ALT 15  0 - 35 U/L   Alkaline Phosphatase 103  39 - 117 U/L   Total Bilirubin <0.2 (*) 0.3 - 1.2 mg/dL   GFR calc non Af Amer 52 (*) >90 mL/min   GFR calc Af Amer 61 (*) >90 mL/min   Comment: (NOTE)     The eGFR has been calculated using the CKD EPI equation.     This calculation has not been validated in all clinical situations.     eGFR's persistently <90 mL/min signify possible Chronic Kidney     Disease.  MAGNESIUM     Status: None   Collection Time    08/01/13  5:43 AM      Result Value Range   Magnesium 1.7  1.5 - 2.5 mg/dL  CBC WITH DIFFERENTIAL     Status: Abnormal   Collection Time    08/01/13  5:43 AM      Result Value Range   WBC 6.0  4.0 - 10.5 K/uL   RBC 3.71 (*) 3.87 - 5.11 MIL/uL   Hemoglobin 9.8 (*) 12.0 - 15.0 g/dL   HCT 30.2 (*) 36.0 - 46.0 %   MCV  81.4  78.0 - 100.0 fL   MCH 26.4  26.0 - 34.0 pg   MCHC 32.5  30.0 - 36.0 g/dL   RDW 15.3  11.5 - 15.5 %   Platelets 216  150 - 400 K/uL   Neutrophils Relative % 47  43 - 77 %   Neutro Abs 2.8  1.7 - 7.7 K/uL   Lymphocytes Relative 35  12 - 46 %   Lymphs Abs 2.1  0.7 - 4.0 K/uL   Monocytes Relative 9  3 - 12 %   Monocytes Absolute 0.5  0.1 - 1.0 K/uL   Eosinophils Relative 8 (*) 0 - 5 %   Eosinophils Absolute 0.5  0.0 - 0.7 K/uL   Basophils Relative 1  0 - 1 %   Basophils Absolute 0.1  0.0 - 0.1 K/uL    No results found.  Positive for bad mood, depression and sleep disturbance Blood pressure 121/69, pulse 103, temperature 98.2 F (36.8 C), temperature source Oral, resp. rate 20, height $RemoveBe'5\' 4"'[id number]$  (1.626 m), weight 95.5 kg (210 lb 8.6 oz), SpO2 100.00%.   Assessment/Plan: Major depressive  disorder, recurrent  Recommendation:  Patient has capacity to make her medical decisions and living arrangements.  Appreciate psychiatric consultation and will sign off at this time   Jaydy Fitzhenry,JANARDHAHA R. 08/01/2013, 10:05 AM

## 2013-08-01 NOTE — Progress Notes (Signed)
   CARE MANAGEMENT NOTE 08/01/2013  Patient:  Shannon Garrett, Shannon Garrett   Account Number:  [id number]  Date Initiated:  08/01/2013  Documentation initiated by:  [facility]  Subjective/Objective Assessment:   MS     Action/Plan:   lives alone   Anticipated DC Date:  08/02/2013   Anticipated DC Plan:  [place]  CM consult      [facility] Choice  HOME HEALTH   Choice offered to / List presented to:  [name]           Status of service:  In process, will continue to follow Medicare Important Message given?   (If response is "NO", the following Medicare IM given date fields will be blank) Date Medicare IM given:   Date Additional Medicare IM given:    Discharge Disposition:    Per UR Regulation:    If discussed at Long Length of Stay Meetings, dates discussed:    Comments:  08/01/2013 1130 NCM spoke to pt and states she lives alone. States she has adequate caregivers to assist at home. She has hospital bed, RW and bedside commode. NCM explained PT will follow up with recommendations for home. States she was not going to SNF. Her desire is to return home. Offered choice for [facility] and pt agreeable to [facility] for [facility]. Explained to pt that insurance will does not cover custodial care at home. Verbalized understanding. She does not have a LTC policy or veteran's benefits. Shannon Finner RN CCM Case Mgmt phone [phone]

## 2013-08-01 NOTE — Evaluation (Signed)
Physical Therapy Evaluation Patient Details Name: Shannon Garrett MRN: [id number] DOB: January 20, 1961 Today's Date: 08/01/2013 Time: 4403-4742 PT Time Calculation (min): 44 min  PT Assessment / Plan / Recommendation History of Present Illness  her H & P on 1/30 "Shannon Garrett is a 53 y.o. BF PMHx  multiple sclerosis progressive/relapsing (treated with Novantrone, Tysabri, Copaxone Symmetrel and Dimethyl Fumerate) scooter-bound"  Clinical Impression  Pt worked hard today, just very weak in BLES and her arms as well. We had an at length conversation about her honest assessment of her safety at home. She admits it is very risky and hard for her to manage, and just struggling with realization that she may not be in home many more months due to her diagnosis. She is hoping to make it to mid February when she will be allowed to have her aide back (?).  Also doesn't like the nursing home with older olks who do not carry on conversation, etc. She struggles  with managing to empty her BSC bucket, getting her legs on the bed , etc. However she stated " I am a bulldog and want to give it one more shot". I was honest with her and stated ," I don't think that is a safe decision, but understand your point of view and your decision". Will continue to follow her while she is her in acute care and fell she will need continue therapy after acute care as well. She cannot care for her wounds on her legs as well.    PT Assessment  Patient needs continued PT services    Follow Up Recommendations  SNF (* see note in Treatment comments. pt refusing SNF, then would need as much HH as she can get. )    Does the patient have the potential to tolerate intense rehabilitation      Barriers to Discharge        Equipment Recommendations  None recommended by PT (pt has equipment)    Recommendations for Other Services OT consult   Frequency Min 3X/week    Precautions / Restrictions Precautions Precautions: Fall (pt has  fallen at home within past year. )   Pertinent Vitals/Pain No pain     Mobility  Bed Mobility Overal bed mobility: Needs Assistance;+ 2 for safety/equipment Bed Mobility: Sit to Supine Sit to supine: Mod assist General bed mobility comments: Pt unable to lift her legs into bed. She says this happens at home sometimes and she has to lie sideway across bed and makes swelling in her legs worse.  Transfers Overall transfer level: Needs assistance Equipment used: Rolling walker (2 wheeled) Transfers: Sit to/from Stand Sit to Stand: +2 physical assistance;+2 safety/equipment;From elevated surface General transfer comment: Sit to stand from recliner first time with min assist of 1 (2nd person just for safety) , however second time, difficult to rise from recliner height and needed modA of 2 people.  Ambulation/Gait Ambulation/Gait assistance: +2 safety/equipment;Min guard Ambulation Distance (Feet): 7 Feet (7 feet twice) Assistive device: Rolling walker (2 wheeled) Gait velocity: slow General Gait Details: shuffled gait, no ability for foot clearance on eiether side, B feet exernal rotation, valgus knees, and flexed. Very labored and weak no room for self correction or pivoting, very difficult.     Exercises     PT Diagnosis: Difficulty walking  PT Problem List: Decreased strength;Decreased activity tolerance;Decreased balance;Decreased mobility;Decreased knowledge of use of DME PT Treatment Interventions: DME instruction;Gait training;Functional mobility training;Therapeutic activities;Therapeutic exercise;Patient/family education     PT Goals(Current goals  can be found in the care plan section)    Visit Information  Last PT Received On: 08/01/13 Assistance Needed: +2 History of Present Illness: her H & P on 1/30 "Shannon Garrett is a 53 y.o. BF PMHx  multiple sclerosis progressive/relapsing (treated with Novantrone, Tysabri, Copaxone Symmetrel and Dimethyl Fumerate) scooter-bound"        Prior [place] expects to be discharged to:: Private residence Living Arrangements: Alone Available Help at Discharge: Home health;Friend(s) (possibly could get friend to check on her 3x/week) Type of Home: House Home Access: Level entry;Ramped entrance Home Layout: One [place]: [place] - 2 wheels;Electric scooter;Bedside commode;Tub bench;Hospital bed;Other (comment) (scooter has seat taht can be elevated for sit to stand. ) Prior Function Level of Independence: Needs assistance Gait / Transfers Assistance Needed: pt states she is able to take a couple steps with RW at baseline. Has to take about 5 steps with RW from bed to her bedroom threshold to then pivot to her scooter secondary to her scooter does not fit into her bedroom.  uses electric scooter for mobility, and BSC for toileting.  ADL's / Homemaking Assistance Needed: pt reported she used to have aide daily to help with this, but it has been extremely hard since she does not have an aide now. Very challenging.  Comments: We had an at length conversation about her honest assessment of her safety at home. She admits it is very risky and hard for her to manage, and just struggling with realization that she may not be in home many more months. Also doesn't like the nursing home with older olks who do not carry on conversation, etc. She struggles ith managing to empty her BSC bucket, getting her legs on the bed , etc. However she stated " I am a bulldog and want to give it one more shot". I was honest with her and stated ," I don't think that is a safe decision, but understand your point of view and your decision".   Communication Communication: No difficulties    Cognition  Cognition Arousal/Alertness: Awake/alert Behavior During Therapy: WFL for tasks assessed/performed Overall Cognitive Status: Within Functional Limits for tasks assessed    Extremity/Trunk Assessment Lower Extremity  Assessment Lower Extremity Assessment: RLE deficits/detail;LLE deficits/detail RLE Deficits / Details: ankle DF 1+/5 and very tight DF PROM, knee ext 2/5, hip flexion 2-/5, hip add 2+/5, hip ABD 3/5 LLE Deficits / Details: ankle DF 1+/5 and very tight DF PROM, knee ext 2/5, hip flexion 2-/5, hip add 2+/5, hip ABD 3/5   Balance    End of Session PT - End of Session Equipment Utilized During Treatment: Gait belt Activity Tolerance: Patient tolerated treatment well Patient left: in bed;with call bell/phone within reach;with nursing/sitter in room Nurse Communication: Mobility status  GP Functional Assessment Tool Used: clincal judgement Functional Limitation: Mobility: Walking and moving around Mobility: Walking and Moving Around Current Status ([id number]): At least 60 percent but less than 80 percent impaired, limited or restricted Mobility: Walking and Moving Around Goal Status [phone]): At least 1 percent but less than 20 percent impaired, limited or restricted   Clide Dales 08/01/2013, 6:31 PM Clide Dales, PT Pager: [phone] 08/01/2013

## 2013-08-01 NOTE — Progress Notes (Deleted)
Pt unable to void.  Bladder scan reveals 548ml in bladder.  MD notified and foley catheter replaced per order. Andre Lefort

## 2013-08-02 DIAGNOSIS — R69 Illness, unspecified: Secondary | ICD-10-CM

## 2013-08-02 LAB — COMPREHENSIVE METABOLIC PANEL
ALBUMIN: 2.5 g/dL — AB (ref 3.5–5.2)
ALK PHOS: 97 U/L (ref 39–117)
ALT: 16 U/L (ref 0–35)
AST: 21 U/L (ref 0–37)
BUN: 9 mg/dL (ref 6–23)
CHLORIDE: 106 meq/L (ref 96–112)
CO2: 29 mEq/L (ref 19–32)
Calcium: 8.1 mg/dL — ABNORMAL LOW (ref 8.4–10.5)
Creatinine, Ser: 1.05 mg/dL (ref 0.50–1.10)
GFR calc Af Amer: 71 mL/min — ABNORMAL LOW (ref >90)
GFR calc non Af Amer: 61 mL/min — ABNORMAL LOW (ref >90)
Glucose, Bld: 89 mg/dL (ref 70–99)
POTASSIUM: 4.2 meq/L (ref 3.7–5.3)
SODIUM: 141 meq/L (ref 137–147)
TOTAL PROTEIN: 5.3 g/dL — AB (ref 6.0–8.3)

## 2013-08-02 LAB — CBC WITH DIFFERENTIAL/PLATELET
BASOS ABS: 0.1 10*3/uL (ref 0.0–0.1)
BASOS PCT: 1 % (ref 0–1)
EOS PCT: 8 % — AB (ref 0–5)
Eosinophils Absolute: 0.5 10*3/uL (ref 0.0–0.7)
HEMATOCRIT: 27.6 % — AB (ref 36.0–46.0)
HEMOGLOBIN: 9.4 g/dL — AB (ref 12.0–15.0)
Lymphocytes Relative: 35 % (ref 12–46)
Lymphs Abs: 2 10*3/uL (ref 0.7–4.0)
MCH: 27.6 pg (ref 26.0–34.0)
MCHC: 34.1 g/dL (ref 30.0–36.0)
MCV: 81.2 fL (ref 78.0–100.0)
MONOS PCT: 10 % (ref 3–12)
Monocytes Absolute: 0.5 10*3/uL (ref 0.1–1.0)
NEUTROS ABS: 2.6 10*3/uL (ref 1.7–7.7)
Neutrophils Relative %: 46 % (ref 43–77)
Platelets: 221 10*3/uL (ref 150–400)
RBC: 3.4 MIL/uL — ABNORMAL LOW (ref 3.87–5.11)
RDW: 15.4 % (ref 11.5–15.5)
WBC: 5.6 10*3/uL (ref 4.0–10.5)

## 2013-08-02 LAB — MAGNESIUM: Magnesium: 1.8 mg/dL (ref 1.5–2.5)

## 2013-08-02 MED ORDER — VITAMINS A & D EX OINT
TOPICAL_OINTMENT | CUTANEOUS | Status: AC
  Administered 2013-08-02: 5
  Filled 2013-08-02: qty 5

## 2013-08-02 NOTE — Progress Notes (Signed)
Asked by RN to arrange for ambulance transport home for after 1300.  Verified home address with Pt.  Arranged for ambulance transport home after 1300.  Bernita Raisin, [place] Work [phone]

## 2013-08-02 NOTE — Progress Notes (Signed)
08/02/2013 1045 NCM spoke pt and states she want to go home. Arranged HH with [facility] for scheduled dc today. Jonnie Finner RN CCM Case Mgmt phone [phone]

## 2013-08-02 NOTE — Progress Notes (Signed)
Physical Therapy Treatment Patient Details Name: Shannon Garrett MRN: [id number] DOB: 05/23/1961 Today's Date: 08/02/2013 Time: 1000-1032 PT Time Calculation (min): 32 min  PT Assessment / Plan / Recommendation  History of Present Illness her H & P on 1/30 "Shannon Garrett is a 53 y.o. BF PMHx  multiple sclerosis progressive/relapsing (treated with Novantrone, Tysabri, Copaxone Symmetrel and Dimethyl Fumerate) scooter-bound"   PT Comments   Pt performed better today and really adamant about trying to conquer this on her own today. Stated she has someone coming by to check on her today. We discussed safety of planning out movement and consequences prior to attempting something unsafe. She also stated she has her life line active. Pt was very appreciative of our services.   Follow Up Recommendations  SNF (I know pt choosing to go home and have [facility] follow her. More settle with that today than yeserday due to her performance better today. )     Does the patient have the potential to tolerate intense rehabilitation     Barriers to Discharge        Equipment Recommendations  None recommended by PT    Recommendations for Other Services OT consult  Frequency Min 3X/week   Progress towards PT Goals Progress towards PT goals: Progressing toward goals  Plan Current plan remains appropriate    Precautions / Restrictions Precautions Precautions: Fall   Pertinent Vitals/Pain No pain    Mobility  Bed Mobility Overal bed mobility: Needs Assistance Bed Mobility: Supine to Sit;Sit to Supine Supine to sit: Modified independent (Device/Increase time);HOB elevated (pt raises bed up to about 80 degrees and uses rails . She has hospital bed at home to do this as well. ) Sit to supine: Min assist General bed mobility comments: tried keeping bed at 80 degrees flexion and using different step stool integration to help get her LEs on bed, however still very challenging to get LEs into bed safely. she was very  near the eadge after getting LEs in bed and had to have +2 help to scoot hips over and get safely in bed. She stated the OT worked on this a lot at home and she feels she can accomplish this again at home. Still worries me.  Transfers Overall transfer level: Needs assistance Equipment used: Rolling walker (2 wheeled) Sit to Stand: Supervision General transfer comment: today, pt seemed much stronger and she staed so herself. able to perfrom sit to stand without much elevatio of surface and no physical assistance as well.  Ambulation/Gait Ambulation/Gait assistance: Supervision Ambulation Distance (Feet): 10 Feet Assistive device: Rolling walker (2 wheeled) Gait Pattern/deviations: WFL(Within Functional Limits) Gait velocity: slow General Gait Details: today , much more fluid with steps and speed and able to walk to door, turn and walk back to bed and turn and pivot.     Exercises     PT Diagnosis:    PT Problem List:   PT Treatment Interventions:     PT Goals (current goals can now be found in the care plan section)    Visit Information  Last PT Received On: 08/02/13 Assistance Needed: +2 History of Present Illness: her H & P on 1/30 "Shannon Garrett is a 53 y.o. BF PMHx  multiple sclerosis progressive/relapsing (treated with Novantrone, Tysabri, Copaxone Symmetrel and Dimethyl Fumerate) scooter-bound"    Subjective Data      Cognition  Cognition Arousal/Alertness: Awake/alert Behavior During Therapy: WFL for tasks assessed/performed Overall Cognitive Status: Within Functional Limits for tasks assessed  Balance     End of Session PT - End of Session Equipment Utilized During Treatment:  (pt refused for me to put one on her , " I have this today" ) Activity Tolerance: Patient tolerated treatment well Patient left: in bed;with nursing/sitter in room Nurse Communication: Mobility status   GP     Clide Dales 08/02/2013, 10:46 AM Clide Dales, PT Pager:  [phone] 08/02/2013

## 2013-08-03 NOTE — Progress Notes (Signed)
Received referral for [facility] Care Management services. Patient is not eligible for [facility] Care Management at this time as her Primary Care Provider, Dr Aris Lot, is not a [facility] Provider. Will make ED RNCM aware. Marthenia Rolling, MSN, RN,BSN- [facility] [phone]

## 2013-08-03 NOTE — Progress Notes (Signed)
[facility] ED CM received voice message from Ms Luiz Blare of [place] from [place]. Zachary with [facility] who states she was informed by NP, Alvester Chou that the pt was being followed by [facility] CM Corrected her to inform her that [facility] was no longer providing services as of 07/31/13.  Malachy Mood states she would not be able to provide services for pt (unsafe environment)  1315 CM called [facility] [phone]) and was informed b female staff to contact Alvester Chou at [phone].  Wallace spoke with Ms Luiz Blare at [facility] to review referral and to update her that pt d/chome with another home health agency (per EPIC notes)  1321 Cm spoke with the pt to see how she was doing She reports she is well reports EMS brought her home and placed her in her chair She reports being able to get to her bed She reports speaking with [facility] staff who will be visiting her 08/03/13 per pt She inquired about her Medicaid application status Cm referred pt to [facility] Cm left J Barr a message updating her on pt's ED visit & Observation stay with Change of home health agency to [facility] as confirmed by Cyril Mourning of [facility] at 1319. She responded to Cm message voicing appreciation for updated information

## 2013-10-14 ENCOUNTER — Emergency Department (HOSPITAL_COMMUNITY)
Admission: EM | Admit: 2013-10-14 | Discharge: 2013-10-14 | Disposition: A | Discharge status: Home / Self Care | Payer: PRIVATE HEALTH INSURANCE | Attending: Emergency Medicine | Admitting: Emergency Medicine

## 2013-10-14 ENCOUNTER — Emergency Department (HOSPITAL_COMMUNITY): Payer: PRIVATE HEALTH INSURANCE

## 2013-10-14 ENCOUNTER — Encounter (HOSPITAL_COMMUNITY): Payer: Self-pay | Admitting: Emergency Medicine

## 2013-10-14 DIAGNOSIS — G8929 Other chronic pain: Secondary | ICD-10-CM | POA: Insufficient documentation

## 2013-10-14 DIAGNOSIS — F3289 Other specified depressive episodes: Secondary | ICD-10-CM | POA: Insufficient documentation

## 2013-10-14 DIAGNOSIS — H409 Unspecified glaucoma: Secondary | ICD-10-CM | POA: Insufficient documentation

## 2013-10-14 DIAGNOSIS — M79609 Pain in unspecified limb: Secondary | ICD-10-CM | POA: Insufficient documentation

## 2013-10-14 DIAGNOSIS — M7989 Other specified soft tissue disorders: Secondary | ICD-10-CM

## 2013-10-14 DIAGNOSIS — I10 Essential (primary) hypertension: Secondary | ICD-10-CM | POA: Insufficient documentation

## 2013-10-14 DIAGNOSIS — Z79899 Other long term (current) drug therapy: Secondary | ICD-10-CM | POA: Insufficient documentation

## 2013-10-14 DIAGNOSIS — G709 Myoneural disorder, unspecified: Secondary | ICD-10-CM | POA: Insufficient documentation

## 2013-10-14 DIAGNOSIS — Z792 Long term (current) use of antibiotics: Secondary | ICD-10-CM | POA: Insufficient documentation

## 2013-10-14 DIAGNOSIS — F329 Major depressive disorder, single episode, unspecified: Secondary | ICD-10-CM | POA: Insufficient documentation

## 2013-10-14 DIAGNOSIS — R609 Edema, unspecified: Secondary | ICD-10-CM | POA: Insufficient documentation

## 2013-10-14 DIAGNOSIS — M79606 Pain in leg, unspecified: Secondary | ICD-10-CM

## 2013-10-14 DIAGNOSIS — I5022 Chronic systolic (congestive) heart failure: Secondary | ICD-10-CM | POA: Insufficient documentation

## 2013-10-14 LAB — COMPREHENSIVE METABOLIC PANEL
ALT: 18 U/L (ref 0–35)
AST: 14 U/L (ref 0–37)
Albumin: 3.4 g/dL — ABNORMAL LOW (ref 3.5–5.2)
Alkaline Phosphatase: 108 U/L (ref 39–117)
BUN: 22 mg/dL (ref 6–23)
CO2: 29 mEq/L (ref 19–32)
Calcium: 9.3 mg/dL (ref 8.4–10.5)
Chloride: 100 mEq/L (ref 96–112)
Creatinine, Ser: 1.21 mg/dL — ABNORMAL HIGH (ref 0.50–1.10)
GFR calc non Af Amer: 51 mL/min — ABNORMAL LOW (ref >90)
GFR, EST AFRICAN AMERICAN: 59 mL/min — AB (ref >90)
GLUCOSE: 99 mg/dL (ref 70–99)
Potassium: 3.3 mEq/L — ABNORMAL LOW (ref 3.7–5.3)
SODIUM: 142 meq/L (ref 137–147)
TOTAL PROTEIN: 6.9 g/dL (ref 6.0–8.3)
Total Bilirubin: <0.2 mg/dL — ABNORMAL LOW (ref 0.3–1.2)

## 2013-10-14 LAB — CBC
HEMATOCRIT: 30.8 % — AB (ref 36.0–46.0)
HEMOGLOBIN: 10.4 g/dL — AB (ref 12.0–15.0)
MCH: 27.7 pg (ref 26.0–34.0)
MCHC: 33.8 g/dL (ref 30.0–36.0)
MCV: 81.9 fL (ref 78.0–100.0)
Platelets: 223 10*3/uL (ref 150–400)
RBC: 3.76 MIL/uL — ABNORMAL LOW (ref 3.87–5.11)
RDW: 15.5 % (ref 11.5–15.5)
WBC: 5.1 10*3/uL (ref 4.0–10.5)

## 2013-10-14 LAB — PRO B NATRIURETIC PEPTIDE: PRO B NATRI PEPTIDE: 57.4 pg/mL (ref 0–125)

## 2013-10-14 MED ORDER — OXYCODONE-ACETAMINOPHEN 5-325 MG PO TABS
2.0000 | ORAL_TABLET | Freq: Once | ORAL | Status: AC
  Administered 2013-10-14: 2 via ORAL
  Filled 2013-10-14: qty 2

## 2013-10-14 MED ORDER — OXYCODONE-ACETAMINOPHEN 5-325 MG PO TABS: 1.0000 | ORAL_TABLET | Freq: Four times a day (QID) | ORAL | Status: DC | PRN

## 2013-10-14 NOTE — ED Notes (Signed)
Pt states that she has MS.  Wheelchair bound.  Pt c/o bilateral leg pain x 2 wks.  Has been taking hydrocodone but it is not helping.  States they took her off lasix and thinks this may be related.

## 2013-10-14 NOTE — Progress Notes (Signed)
*  PRELIMINARY RESULTS* Vascular Ultrasound Lower extremity venous duplex has been completed.  Preliminary findings: no evidence of DVT in visualized veins.   Chapman Fitch RVT 10/14/2013, 4:42 PM

## 2013-10-14 NOTE — ED Notes (Signed)
Pt alert and oriented x4. Respirations even and unlabored, bilateral symmetrical rise and fall of chest. Skin warm and dry. In no acute distress. Denies needs.   

## 2013-10-14 NOTE — Discharge Instructions (Signed)
Musculoskeletal Pain °Musculoskeletal pain is muscle and boney aches and pains. These pains can occur in any part of the body. Your caregiver may treat you without knowing the cause of the pain. They may treat you if blood or urine tests, X-rays, and other tests were normal.  °CAUSES °There is often not a definite cause or reason for these pains. These pains may be caused by a type of germ (virus). The discomfort may also come from overuse. Overuse includes working out too hard when your body is not fit. Boney aches also come from weather changes. Bone is sensitive to atmospheric pressure changes. °HOME CARE INSTRUCTIONS  °· Ask when your test results will be ready. Make sure you get your test results. °· Only take over-the-counter or prescription medicines for pain, discomfort, or fever as directed by your caregiver. If you were given medications for your condition, do not drive, operate machinery or power tools, or sign legal documents for 24 hours. Do not drink alcohol. Do not take sleeping pills or other medications that may interfere with treatment. °· Continue all activities unless the activities cause more pain. When the pain lessens, slowly resume normal activities. Gradually increase the intensity and duration of the activities or exercise. °· During periods of severe pain, bed rest may be helpful. Lay or sit in any position that is comfortable. °· Putting ice on the injured area. °· Put ice in a bag. °· Place a towel between your skin and the bag. °· Leave the ice on for 15 to 20 minutes, 3 to 4 times a day. °· Follow up with your caregiver for continued problems and no reason can be found for the pain. If the pain becomes worse or does not go away, it may be necessary to repeat tests or do additional testing. Your caregiver may need to look further for a possible cause. °SEEK IMMEDIATE MEDICAL CARE IF: °· You have pain that is getting worse and is not relieved by medications. °· You develop chest pain  that is associated with shortness or breath, sweating, feeling sick to your stomach (nauseous), or throw up (vomit). °· Your pain becomes localized to the abdomen. °· You develop any new symptoms that seem different or that concern you. °MAKE SURE YOU:  °· Understand these instructions. °· Will watch your condition. °· Will get help right away if you are not doing well or get worse. °Document Released: 06/18/2005 Document Revised: 09/10/2011 Document Reviewed: 02/20/2013 °ExitCare® Patient Information ©2014 [employer]. ° °

## 2013-10-14 NOTE — ED Provider Notes (Signed)
CSN: [id number]     Arrival date & time 10/14/13  1421 History   First MD Initiated Contact with Patient 10/14/13 1527     Chief Complaint  Patient presents with  . Leg Pain     (Consider location/radiation/quality/duration/timing/severity/associated sxs/prior Treatment) Patient is a 53 y.o. female presenting with leg pain. The history is provided by the patient.  Leg Pain Location:  Leg Time since incident:  2 weeks Injury: no   Leg location:  R lower leg Pain details:    Quality:  Aching   Radiates to:  Does not radiate   Severity:  Moderate   Onset quality:  Gradual   Duration:  2 weeks   Timing:  Constant   Progression:  Worsening Chronicity:  Recurrent Dislocation: no   Associated symptoms: no fever     Past Medical History  Diagnosis Date  . MS (multiple sclerosis)     a. Dx'd late 20's. b. Tx with Novantrone, Tysabri, Copaxone previously.  Marland Kitchen Hypertension   . Depression   . Glaucoma   . Cardiomyopathy     a.  Echo 04/29/12: Mild LVH, EF 20-25%, mild AI, moderate MR, moderate LAE, mild RAE, mild RVE, moderate TR, PASP 51, small pericardial effusion;   b. probably non-ischemic given multiple chemo-Tx agents used for MS and global LV dysfn on echo  . Chronic systolic heart failure   . CHF (congestive heart failure)   . Shortness of breath   . Neuromuscular disorder     MS   Past Surgical History  Procedure Laterality Date  . Ablation      uterine  . Cesarean section     Family History  Problem Relation Age of Onset  . Hypertension Mother    History  Substance Use Topics  . Smoking status: Never Smoker   . Smokeless tobacco: Never Used  . Alcohol Use: No   OB History   Grav Para Term Preterm Abortions TAB SAB Ect Mult Living                 Review of Systems  Constitutional: Negative for fever and chills.  Respiratory: Negative for cough and shortness of breath.   Gastrointestinal: Negative for vomiting and abdominal pain.  All other systems  reviewed and are negative.     Allergies  Sulfonamide derivatives and Penicillins  Home Medications   Prior to Admission medications   Medication Sig Start Date End Date Taking? Authorizing Provider  amantadine (SYMMETREL) 100 MG capsule Take 100 mg by mouth 2 (two) times daily.    Yes Historical Provider, MD  carvedilol (COREG) 25 MG tablet Take 25 mg by mouth 2 (two) times daily with a meal.   Yes Historical Provider, MD  furosemide (LASIX) 80 MG tablet Take 80-160 mg by mouth 2 (two) times daily. Take 160 mg every morning and 80 mg every evening.   Yes Historical Provider, MD  gabapentin (NEURONTIN) 600 MG tablet Take 600 mg by mouth 3 (three) times daily.    Yes Historical Provider, MD  HYDROcodone-acetaminophen (NORCO/VICODIN) 5-325 MG per tablet Take 1-2 tablets by mouth every 4 (four) hours as needed. 05/25/13  Yes Illene Labrador, PA-C  imipramine (TOFRANIL) 50 MG tablet Take 100 mg by mouth at bedtime.   Yes Historical Provider, MD  Multiple Vitamin (MULTIVITAMIN WITH MINERALS) TABS tablet Take 1 tablet by mouth daily.   Yes Historical Provider, MD  nitrofurantoin (MACRODANTIN) 100 MG capsule Take 100 mg by mouth daily.  Yes Historical Provider, MD  solifenacin (VESICARE) 10 MG tablet Take 10 mg by mouth 2 (two) times daily.    Yes Historical Provider, MD  spironolactone (ALDACTONE) 25 MG tablet Take 25 mg by mouth daily.   Yes Historical Provider, MD  Travoprost, BAK Free, (TRAVATAN) 0.004 % SOLN ophthalmic solution Place 1 drop into both eyes at bedtime.    Yes Historical Provider, MD  triamcinolone cream (KENALOG) 0.1 % Apply 1 application topically as needed (inflammation, itching).  04/28/13  Yes Historical Provider, MD  zolpidem (AMBIEN) 5 MG tablet Take 5 mg by mouth at bedtime.   Yes Historical Provider, MD   BP 138/88  Pulse 88  Temp(Src) 98 F (36.7 C) (Oral)  Resp 18  SpO2 100% Physical Exam  Nursing note and vitals reviewed. Constitutional: She is oriented to  person, place, and time. She appears well-developed and well-nourished. No distress.  HENT:  Head: Normocephalic and atraumatic.  Eyes: EOM are normal. Pupils are equal, round, and reactive to light.  Neck: Normal range of motion. Neck supple.  Cardiovascular: Normal rate and regular rhythm.  Exam reveals no friction rub.   No murmur heard. Pulmonary/Chest: Effort normal and breath sounds normal. No respiratory distress. She has no wheezes. She has no rales.  Abdominal: Soft. She exhibits no distension. There is no tenderness. There is no rebound.  Musculoskeletal: Normal range of motion. She exhibits edema (2+ up to knee, bilaterally).  Neurological: She is alert and oriented to person, place, and time.  Skin: She is not diaphoretic.    ED Course  Procedures (including critical care time) Labs Review Labs Reviewed  CBC  COMPREHENSIVE METABOLIC PANEL  PRO B NATRIURETIC PEPTIDE    Imaging Review Dg Chest 1 View  10/14/2013   CLINICAL DATA:  Shortness of breath, hypertension  EXAM: CHEST - 1 VIEW  COMPARISON:  05/24/2013  FINDINGS: The heart size and mediastinal contours are within normal limits. Both lungs are clear. The visualized skeletal structures are unremarkable.  IMPRESSION: No active disease.   Electronically Signed   By: Daryll Brod M.D.   On: 10/14/2013 17:16   Dg Tibia/fibula Left  10/14/2013   CLINICAL DATA:  LEG PAIN  EXAM: LEFT TIBIA AND FIBULA - 2 VIEW  COMPARISON:  None.  FINDINGS: There is no evidence of fracture or other focal bone lesions. Soft tissues are unremarkable. Dystrophic appearing calcification within anterior soft tissues of the distal left lower extremity.  IMPRESSION: Negative.   Electronically Signed   By: Margaree Mackintosh M.D.   On: 10/14/2013 17:19   Dg Tibia/fibula Right  10/14/2013   CLINICAL DATA:  LEG PAIN  EXAM: RIGHT TIBIA AND FIBULA - 2 VIEW  COMPARISON:  DG FEMUR*R* dated 05/24/2013; DG ANKLE COMPLETE*R* dated 06/01/2011  FINDINGS: There is no  evidence of fracture or other focal bone lesions. Soft tissues are unremarkable. Healed remote fracture distal fibula.  IMPRESSION: No acute osseous abnormalities.   Electronically Signed   By: Margaree Mackintosh M.D.   On: 10/14/2013 17:22     EKG Interpretation None      *PRELIMINARY RESULTS*  Vascular Ultrasound  Lower extremity venous duplex has been completed. Preliminary findings: no evidence of DVT in visualized veins.  Chapman Fitch RVT  10/14/2013, 4:42 PM   MDM   Final diagnoses:  Leg pain    58F with hx of paraplegia, scooter bound, chronic pain presents with worsening bilateral leg pain for 2 weeks. No hx of blood clots listed.  No falls out of chair. States hx of leg pain, thinks it's related to her being off her lasix. No fevers, vomiting, coughing, SOB, diarrhea. Hydrocodone not helping. Here AFVSS. Bilateral legs with pitting edema up to knee.  Listed as having lasix in her medications, but she reports not taking it. Will wheelchair bound, will [place] legs for DVT. Will also check labs to look for possible CHF exacerbation, hypoalbuminemia. No DVT, labs ok, CXR ok. Likely acute on chronic pain. Given small amount of percocet. Stable for discharge.  Osvaldo Shipper, MD 10/15/13 [phone]

## 2013-10-14 NOTE — ED Notes (Signed)
Pt escorted to discharge window. Pt verbalized understanding discharge instructions. In no acute distress.  

## 2013-10-14 NOTE — ED Notes (Signed)
Pt c/o legs feeling tight x 2 wks; states has been taking her meds as prescribed; pt states NP gave her hydrocortisone but it is not helping; told she had to come to ER due to increased pain; states 2 hydrocodone pills not helping pain

## 2013-10-26 ENCOUNTER — Encounter (INDEPENDENT_AMBULATORY_CARE_PROVIDER_SITE_OTHER): Payer: Self-pay | Admitting: General Surgery

## 2013-10-26 ENCOUNTER — Ambulatory Visit (INDEPENDENT_AMBULATORY_CARE_PROVIDER_SITE_OTHER): Payer: PRIVATE HEALTH INSURANCE | Admitting: General Surgery

## 2013-10-26 VITALS — BP 120/90 | HR 80 | Temp 97.6°F | Resp 14 | Wt 205.0 lb

## 2013-10-26 DIAGNOSIS — G35 Multiple sclerosis: Secondary | ICD-10-CM

## 2013-10-26 NOTE — Progress Notes (Signed)
Patient ID: Shannon Garrett, female   DOB: 11-Aug-1960, 53 y.o.   MRN: [id number] History: This patient has an office visit today, stating that she needs a central line or Port-A-Cath and that she is referred by Alvester Chou, RN. Apparently [name] helps her with home care needs. The patient is a very poor historian. There may be some IV access issues for multiple sclerosis. She states that her neurologist is Dr. Delphia Grates in [place],  [place]. She apparently was hospitalized recently for rhabdomyolysis and was in a skilled nursing facility for a short period of time and was managed by [employer] [facility]. She is being discharged and apparently they are not her PCP. The patient does not know who her primary care physician is. I talked with [name]  for about 10 minutes and we attempted to call Alvester Chou and left a message on her answeringmachine.  Plan  I told the patient that we could not perform any type of IV access unless her managing physician discussed this with [place] or sent [place] a letter stating the intended purpose and duration of need. Alternatively, we also told her that interventional radiology could place these IVs and catheters upon referral by a physician and that might be easier to schedule I will standby should we need to intervene or discuss her care with her managing physicians. Discharge from office without charge.   Edsel Petrin. Dalbert Batman, M.D., [facility] General and Minimally invasive Surgery Breast and Colorectal Surgery Office:   [phone]

## 2013-10-26 NOTE — Patient Instructions (Signed)
It is not clear to me why Alvester Chou, RN,  referred you to me.  We cannot put in a Port-A-Cath or a central line unless you are referred by your managing physician who determines that it is indicated.  We have tried to call Alvester Chou, and we left a message on her answering machine.  Please have your managing physician contact us to discuss you medical problems, and the indication for central line or Port-A-Cath insertion and we will be happy to discuss that.  These IVs can actually be put in by the interventional radiologist as an outpatient, and that might save you some time.

## 2013-10-29 ENCOUNTER — Telehealth: Payer: Self-pay | Admitting: Cardiovascular Disease

## 2013-10-29 NOTE — Telephone Encounter (Signed)
New message          Pt wants to know if there is something else that she can take to relieve the fluid in her legs besides the water pills. She also has blisters on her legs and tightness. Please call pt back.

## 2013-10-29 NOTE — Telephone Encounter (Signed)
I spoke with the pt and she is no longer in a SNF, she is back at home.  The pt complains of swelling in her legs and her skin is tight.  The pt has developed blisters and they are weeping.  The pt does not apply dressings to her legs.  Home health use to come into the home for dressing changes but the pt's PCP did not renew home health orders. The pt would like to know if we can help arrange home health again to dress her legs.  I made the pt aware that at this time she needs to continue furosemide.  I will forward this message to Dr Burt Knack to review.

## 2013-11-02 NOTE — Telephone Encounter (Signed)
Agree with plan to continue lasix and follow-up with Richardson Dopp as planned

## 2013-11-03 ENCOUNTER — Encounter (HOSPITAL_COMMUNITY): Payer: Self-pay | Admitting: Emergency Medicine

## 2013-11-03 ENCOUNTER — Emergency Department (HOSPITAL_COMMUNITY): Payer: PRIVATE HEALTH INSURANCE

## 2013-11-03 ENCOUNTER — Emergency Department (HOSPITAL_COMMUNITY)
Admission: EM | Admit: 2013-11-03 | Discharge: 2013-11-04 | Disposition: A | Discharge status: Home / Self Care | Payer: PRIVATE HEALTH INSURANCE | Attending: Emergency Medicine | Admitting: Emergency Medicine

## 2013-11-03 DIAGNOSIS — F329 Major depressive disorder, single episode, unspecified: Secondary | ICD-10-CM | POA: Insufficient documentation

## 2013-11-03 DIAGNOSIS — R1084 Generalized abdominal pain: Secondary | ICD-10-CM | POA: Insufficient documentation

## 2013-11-03 DIAGNOSIS — R5381 Other malaise: Secondary | ICD-10-CM | POA: Insufficient documentation

## 2013-11-03 DIAGNOSIS — F3289 Other specified depressive episodes: Secondary | ICD-10-CM | POA: Insufficient documentation

## 2013-11-03 DIAGNOSIS — I5022 Chronic systolic (congestive) heart failure: Secondary | ICD-10-CM | POA: Insufficient documentation

## 2013-11-03 DIAGNOSIS — R5383 Other fatigue: Secondary | ICD-10-CM

## 2013-11-03 DIAGNOSIS — Z87891 Personal history of nicotine dependence: Secondary | ICD-10-CM | POA: Insufficient documentation

## 2013-11-03 DIAGNOSIS — G35 Multiple sclerosis: Secondary | ICD-10-CM

## 2013-11-03 DIAGNOSIS — I10 Essential (primary) hypertension: Secondary | ICD-10-CM | POA: Insufficient documentation

## 2013-11-03 DIAGNOSIS — Z880 Allergy status to penicillin: Secondary | ICD-10-CM | POA: Insufficient documentation

## 2013-11-03 DIAGNOSIS — IMO0001 Reserved for inherently not codable concepts without codable children: Secondary | ICD-10-CM | POA: Insufficient documentation

## 2013-11-03 DIAGNOSIS — Z79899 Other long term (current) drug therapy: Secondary | ICD-10-CM | POA: Insufficient documentation

## 2013-11-03 DIAGNOSIS — H409 Unspecified glaucoma: Secondary | ICD-10-CM | POA: Insufficient documentation

## 2013-11-03 LAB — COMPREHENSIVE METABOLIC PANEL
ALK PHOS: 133 U/L — AB (ref 39–117)
ALT: 29 U/L (ref 0–35)
AST: 34 U/L (ref 0–37)
Albumin: 3.5 g/dL (ref 3.5–5.2)
BUN: 18 mg/dL (ref 6–23)
CALCIUM: 9.5 mg/dL (ref 8.4–10.5)
CO2: 33 mEq/L — ABNORMAL HIGH (ref 19–32)
Chloride: 97 mEq/L (ref 96–112)
Creatinine, Ser: 1.11 mg/dL — ABNORMAL HIGH (ref 0.50–1.10)
GFR calc non Af Amer: 57 mL/min — ABNORMAL LOW (ref >90)
GFR, EST AFRICAN AMERICAN: 66 mL/min — AB (ref >90)
GLUCOSE: 131 mg/dL — AB (ref 70–99)
POTASSIUM: 3.4 meq/L — AB (ref 3.7–5.3)
Sodium: 143 mEq/L (ref 137–147)
Total Bilirubin: 0.3 mg/dL (ref 0.3–1.2)
Total Protein: 7.5 g/dL (ref 6.0–8.3)

## 2013-11-03 LAB — CBC WITH DIFFERENTIAL/PLATELET
Basophils Absolute: 0 10*3/uL (ref 0.0–0.1)
Basophils Relative: 0 % (ref 0–1)
EOS ABS: 0.5 10*3/uL (ref 0.0–0.7)
EOS PCT: 5 % (ref 0–5)
HCT: 31.1 % — ABNORMAL LOW (ref 36.0–46.0)
HEMOGLOBIN: 10.6 g/dL — AB (ref 12.0–15.0)
LYMPHS ABS: 2.3 10*3/uL (ref 0.7–4.0)
Lymphocytes Relative: 24 % (ref 12–46)
MCH: 27.7 pg (ref 26.0–34.0)
MCHC: 34.1 g/dL (ref 30.0–36.0)
MCV: 81.4 fL (ref 78.0–100.0)
MONO ABS: 1 10*3/uL (ref 0.1–1.0)
Monocytes Relative: 10 % (ref 3–12)
Neutro Abs: 5.9 10*3/uL (ref 1.7–7.7)
Neutrophils Relative %: 61 % (ref 43–77)
Platelets: 254 10*3/uL (ref 150–400)
RBC: 3.82 MIL/uL — AB (ref 3.87–5.11)
RDW: 15.3 % (ref 11.5–15.5)
WBC: 9.7 10*3/uL (ref 4.0–10.5)

## 2013-11-03 LAB — CK: CK TOTAL: 1258 U/L — AB (ref 7–177)

## 2013-11-03 MED ORDER — LATANOPROST 0.005 % OP SOLN
1.0000 [drp] | Freq: Every day | OPHTHALMIC | Status: DC
  Administered 2013-11-03: 1 [drp] via OPHTHALMIC
  Filled 2013-11-03 (×2): qty 2.5

## 2013-11-03 MED ORDER — SPIRONOLACTONE 25 MG PO TABS
25.0000 mg | ORAL_TABLET | Freq: Every day | ORAL | Status: DC
  Administered 2013-11-03 – 2013-11-04 (×2): 25 mg via ORAL
  Filled 2013-11-03 (×2): qty 1

## 2013-11-03 MED ORDER — CARVEDILOL 25 MG PO TABS
25.0000 mg | ORAL_TABLET | Freq: Two times a day (BID) | ORAL | Status: DC
Start: 2013-11-03 — End: 2013-11-04
  Administered 2013-11-03 – 2013-11-04 (×3): 25 mg via ORAL
  Filled 2013-11-03 (×5): qty 1

## 2013-11-03 MED ORDER — DARIFENACIN HYDROBROMIDE ER 15 MG PO TB24
15.0000 mg | ORAL_TABLET | Freq: Two times a day (BID) | ORAL | Status: DC
  Administered 2013-11-03 – 2013-11-04 (×3): 15 mg via ORAL
  Filled 2013-11-03 (×5): qty 1

## 2013-11-03 MED ORDER — ADULT MULTIVITAMIN W/MINERALS CH
1.0000 | ORAL_TABLET | Freq: Every day | ORAL | Status: DC
  Administered 2013-11-03 – 2013-11-04 (×2): 1 via ORAL
  Filled 2013-11-03 (×2): qty 1

## 2013-11-03 MED ORDER — GABAPENTIN 600 MG PO TABS
600.0000 mg | ORAL_TABLET | Freq: Three times a day (TID) | ORAL | Status: DC
  Administered 2013-11-03 – 2013-11-04 (×4): 600 mg via ORAL
  Filled 2013-11-03 (×7): qty 1

## 2013-11-03 MED ORDER — ZOLPIDEM TARTRATE 5 MG PO TABS
5.0000 mg | ORAL_TABLET | Freq: Every day | ORAL | Status: DC
  Administered 2013-11-03: 5 mg via ORAL
  Filled 2013-11-03: qty 1

## 2013-11-03 MED ORDER — POTASSIUM CHLORIDE CRYS ER 20 MEQ PO TBCR
20.0000 meq | EXTENDED_RELEASE_TABLET | Freq: Every day | ORAL | Status: DC
  Administered 2013-11-03 – 2013-11-04 (×2): 20 meq via ORAL
  Filled 2013-11-03 (×2): qty 1

## 2013-11-03 MED ORDER — FUROSEMIDE 20 MG PO TABS
80.0000 mg | ORAL_TABLET | Freq: Every evening | ORAL | Status: DC
  Administered 2013-11-03: 80 mg via ORAL
  Filled 2013-11-03: qty 4

## 2013-11-03 MED ORDER — OXYCODONE-ACETAMINOPHEN 5-325 MG PO TABS
1.0000 | ORAL_TABLET | Freq: Four times a day (QID) | ORAL | Status: DC | PRN
  Administered 2013-11-03 – 2013-11-04 (×4): 1 via ORAL
  Filled 2013-11-03 (×4): qty 1

## 2013-11-03 MED ORDER — FUROSEMIDE 20 MG PO TABS
160.0000 mg | ORAL_TABLET | Freq: Once | ORAL | Status: AC
  Administered 2013-11-04: 160 mg via ORAL
  Filled 2013-11-03: qty 8

## 2013-11-03 MED ORDER — IMIPRAMINE PAMOATE 100 MG PO CAPS
100.0000 mg | ORAL_CAPSULE | Freq: Every day | ORAL | Status: DC
  Administered 2013-11-03: 100 mg via ORAL
  Filled 2013-11-03 (×3): qty 1

## 2013-11-03 MED ORDER — AMANTADINE HCL 100 MG PO CAPS
100.0000 mg | ORAL_CAPSULE | Freq: Two times a day (BID) | ORAL | Status: DC
  Administered 2013-11-03 – 2013-11-04 (×3): 100 mg via ORAL
  Filled 2013-11-03 (×5): qty 1

## 2013-11-03 MED ORDER — SODIUM CHLORIDE 0.9 % IV BOLUS (SEPSIS)
1000.0000 mL | Freq: Once | INTRAVENOUS | Status: AC
  Administered 2013-11-03: 1000 mL via INTRAVENOUS

## 2013-11-03 MED ORDER — MORPHINE SULFATE 4 MG/ML IJ SOLN
4.0000 mg | Freq: Once | INTRAMUSCULAR | Status: AC
  Administered 2013-11-03: 4 mg via INTRAVENOUS

## 2013-11-03 MED ORDER — NITROFURANTOIN MONOHYD MACRO 100 MG PO CAPS
100.0000 mg | ORAL_CAPSULE | Freq: Every day | ORAL | Status: DC
  Administered 2013-11-03 – 2013-11-04 (×2): 100 mg via ORAL
  Filled 2013-11-03 (×2): qty 1

## 2013-11-03 MED ORDER — DEXTROSE 5 % IV SOLN
160.0000 mg | Freq: Every day | INTRAVENOUS | Status: DC
  Filled 2013-11-03: qty 16

## 2013-11-03 NOTE — ED Notes (Signed)
WOUND NURSE IN TO EVALUATE PATIENT

## 2013-11-03 NOTE — ED Notes (Signed)
[facility] number not secured today.  Dayshift ED CSW will f/u in am re: number and facilitate NH tx.

## 2013-11-03 NOTE — ED Provider Notes (Signed)
CSN: [id number]     Arrival date & time 11/03/13  0136 History   First MD Initiated Contact with Patient 11/03/13 0150     Chief Complaint  Patient presents with  . Multiple Sclerosis     (Consider location/radiation/quality/duration/timing/severity/associated sxs/prior Treatment) HPI Patient has a past medical history of progressive multiple sclerosis. She states she lives alone but has an aid who comes out daily to help. She continues to have progressive weakness and pain diffusely. She slumped out of her chair onto the floor and was able was unable to get up. She states she's on the floor for 2-3 hours before EMS arrived. She states she was unable to take her pain medications and is complaining of her chronic pain. She has no new pain. She denies any head or neck injury. She is requesting placement in a rehabilitation facility. Past Medical History  Diagnosis Date  . MS (multiple sclerosis)     a. Dx'd late 20's. b. Tx with Novantrone, Tysabri, Copaxone previously.  Marland Kitchen Hypertension   . Depression   . Glaucoma   . Cardiomyopathy     a.  Echo 04/29/12: Mild LVH, EF 20-25%, mild AI, moderate MR, moderate LAE, mild RAE, mild RVE, moderate TR, PASP 51, small pericardial effusion;   b. probably non-ischemic given multiple chemo-Tx agents used for MS and global LV dysfn on echo  . Chronic systolic heart failure   . CHF (congestive heart failure)   . Shortness of breath   . Neuromuscular disorder     MS   Past Surgical History  Procedure Laterality Date  . Ablation      uterine  . Cesarean section     Family History  Problem Relation Age of Onset  . Hypertension Mother    History  Substance Use Topics  . Smoking status: Former Research scientist (life sciences)  . Smokeless tobacco: Never Used  . Alcohol Use: No   OB History   Grav Para Term Preterm Abortions TAB SAB Ect Mult Living                 Review of Systems  Constitutional: Negative for fever and chills.  Respiratory: Negative for shortness  of breath.   Cardiovascular: Negative for chest pain.  Gastrointestinal: Positive for abdominal pain. Negative for nausea, vomiting and diarrhea.  Musculoskeletal: Positive for arthralgias and myalgias. Negative for back pain, neck pain and neck stiffness.  Skin: Negative for rash and wound.  Neurological: Positive for weakness. Negative for dizziness, light-headedness, numbness and headaches.  All other systems reviewed and are negative.     Allergies  Sulfonamide derivatives and Penicillins  Home Medications   Prior to Admission medications   Medication Sig Start Date End Date Taking? Authorizing Provider  amantadine (SYMMETREL) 100 MG capsule Take 100 mg by mouth 2 (two) times daily.    Yes Historical Provider, MD  carvedilol (COREG) 25 MG tablet Take 25 mg by mouth 2 (two) times daily with a meal.   Yes Historical Provider, MD  furosemide (LASIX) 80 MG tablet Take 80-160 mg by mouth 2 (two) times daily. Take 160 mg every morning and 80 mg every evening.   Yes Historical Provider, MD  gabapentin (NEURONTIN) 600 MG tablet Take 600 mg by mouth 3 (three) times daily.    Yes Historical Provider, MD  Multiple Vitamin (MULTIVITAMIN WITH MINERALS) TABS tablet Take 1 tablet by mouth daily.   Yes Historical Provider, MD  nitrofurantoin (MACRODANTIN) 100 MG capsule Take 100 mg by mouth daily.  Yes Historical Provider, MD  oxyCODONE-acetaminophen (PERCOCET) 5-325 MG per tablet Take 1 tablet by mouth every 6 (six) hours as needed for moderate pain. 10/14/13  Yes Osvaldo Shipper, MD  potassium chloride SA (K-DUR,KLOR-CON) 20 MEQ tablet  10/13/13  Yes Historical Provider, MD  solifenacin (VESICARE) 10 MG tablet Take 10 mg by mouth 2 (two) times daily.    Yes Historical Provider, MD  spironolactone (ALDACTONE) 25 MG tablet Take 25 mg by mouth daily.   Yes Historical Provider, MD  imipramine (TOFRANIL) 50 MG tablet Take 100 mg by mouth at bedtime.    Historical Provider, MD  Travoprost, BAK Free,  (TRAVATAN) 0.004 % SOLN ophthalmic solution Place 1 drop into both eyes at bedtime.     Historical Provider, MD  zolpidem (AMBIEN) 5 MG tablet Take 5 mg by mouth at bedtime.    Historical Provider, MD   BP 128/80  Pulse 95  Temp(Src) 98 F (36.7 C) (Oral)  Resp 21  SpO2 100% Physical Exam  Nursing note and vitals reviewed. Constitutional: She is oriented to person, place, and time. She appears well-developed and well-nourished. No distress.  HENT:  Head: Normocephalic and atraumatic.  Mouth/Throat: Oropharynx is clear and moist.  No evidence of any trauma.  Eyes: EOM are normal. Pupils are equal, round, and reactive to light.  Neck: Normal range of motion. Neck supple.  No posterior midline cervical tenderness to palpation.  Cardiovascular: Normal rate and regular rhythm.   Pulmonary/Chest: Effort normal and breath sounds normal. No respiratory distress. She has no wheezes. She has no rales. She exhibits no tenderness.  Abdominal: Soft. Bowel sounds are normal. There is tenderness (mild diffuse tenderness to palpation without focality.).  Musculoskeletal: Normal range of motion. She exhibits tenderness (diffuse tenderness to all extremities.). She exhibits no edema.  No calf swelling  Neurological: She is alert and oriented to person, place, and time.  1/5 motor in left upper extremity, left lower extremity and right lower extremity. 4/5 motor in the right upper extremity. Sensation is grossly intact.  Skin: Skin is warm and dry. No rash noted. No erythema.  Psychiatric: She has a normal mood and affect. Her behavior is normal.    ED Course  Procedures (including critical care time) Labs Review Labs Reviewed  CBC WITH DIFFERENTIAL - Abnormal; Notable for the following:    RBC 3.82 (*)    Hemoglobin 10.6 (*)    HCT 31.1 (*)    All other components within normal limits  COMPREHENSIVE METABOLIC PANEL - Abnormal; Notable for the following:    Potassium 3.4 (*)    CO2 33 (*)     Glucose, Bld 131 (*)    Creatinine, Ser 1.11 (*)    Alkaline Phosphatase 133 (*)    GFR calc non Af Amer 57 (*)    GFR calc Af Amer 66 (*)    All other components within normal limits  CK - Abnormal; Notable for the following:    Total CK 1258 (*)    All other components within normal limits  URINALYSIS, ROUTINE W REFLEX MICROSCOPIC    Imaging Review Ct Head Wo Contrast  11/03/2013   CLINICAL DATA:  Multiple sclerosis.  Fall.  EXAM: CT HEAD WITHOUT CONTRAST  TECHNIQUE: Contiguous axial images were obtained from the base of the skull through the vertex without intravenous contrast.  COMPARISON:  06/06/2012  FINDINGS: Skull and Sinuses:Right parietal scalp swelling without underlying fracture.  Orbits: No acute abnormality.  Brain: No evidence of  acute abnormality, such as acute infarction, hemorrhage, hydrocephalus, or mass lesion/mass effect. Stable pattern of extensive deep and subcortical white matter disease in this patient with known multiple sclerosis.  IMPRESSION: 1. No evidence of acute intracranial injury. 2. Extensive white matter disease in this patient with history of multiple sclerosis.   Electronically Signed   By: Jorje Guild M.D.   On: 11/03/2013 03:23     EKG Interpretation None      MDM   Final diagnoses:  None    Discussed with Dr. Leonel Ramsay. Given the fact patient's weakness this is chronic and progressive, no acute treatment is indicated at this point. Patient signed out to oncoming emergency physician pending social work consult.   Julianne Rice, MD 11/10/13 [phone]

## 2013-11-03 NOTE — ED Notes (Signed)
MEDS REQUESTED FROM PHARMACY 

## 2013-11-03 NOTE — Evaluation (Signed)
Physical Therapy Evaluation Patient Details Name: Shannon Garrett MRN: [id number] DOB: 1960-07-23 Today's Date: 11/03/2013   History of Present Illness  Brought to ED 11/03/13 due to fall to floor (slid off her bed) with EMS called. Pt reports she has been getting weaker recently and fallen numerous times with EMS called to get her off the floor. This time she was too weak to get up with their assist. PMHx-MS, cardiomyopathy with CHF, depression  Clinical Impression  Pt brought to ED after fall OOB (see above). Lengthy discussion with pt and she realizes home alone is not a safe option for her at this time. She is agreeable to continued therapies with her goal being to return home with assist of an aide. Pt currently with functional limitations due to the deficits listed below (see PT Problem List).  Pt will benefit from skilled PT to increase their independence and safety with mobility to allow discharge to the venue listed below.      Follow Up Recommendations SNF;Supervision/Assistance - 24 hour    Equipment Recommendations  None recommended by PT    Recommendations for Other Services       Precautions / Restrictions Precautions Precautions: Fall      Mobility  Bed Mobility Overal bed mobility: Needs Assistance Bed Mobility: Rolling;Sidelying to Sit;Sit to Supine Rolling: Mod assist Sidelying to sit: Mod assist   Sit to supine: Max assist;+2 for physical assistance   General bed mobility comments: rolling with rail; to get to EOB, most of assist provided to lower body/legs due to weakness; on stand to sit at EOB, pt with decr control of descent, sat quickly with shoulders rocking behind her hips and she fell backwards onto bed. Could not pull up to sit due to hips slid forward too close to edge of mattress. +2 to pick her legs up and rotate her shoulders to supine.  Transfers Overall transfer level: Needs assistance Equipment used: Rolling walker (2 wheeled) Transfers: Sit  to/from [facility] Sit to Stand: Min assist;From elevated surface Stand pivot transfers: Min assist;+2 physical assistance;From elevated surface       General transfer comment: pt uses elevated hospital bed and elevated BSC at home (simulated here); pt with difficulty standing from [facility] (never achieved full hip/knee extension) and pivoted with RW in this flexed position (appeared very unsafe with pt near-fall as she "flopped" to sit down on the bed  Ambulation/Gait             General Gait Details: not safe; pivotal steps only  Science writer    Modified Rankin (Stroke Patients Only)       Balance Overall balance assessment: Needs assistance Sitting-balance support: Single extremity supported;Feet supported Sitting balance-Leahy Scale: Poor     Standing balance support: Bilateral upper extremity supported Standing balance-Leahy Scale: Poor                               Pertinent Vitals/Pain Reports upper back and shoulders sore from trying to get off the floor after fall; pt had pain medicine 1 hr prior to session    Home Living Family/patient expects to be discharged to:: Skilled nursing facility Living Arrangements: Alone Available Help at Discharge: Friend(s) Type of Home: House Home Access: Level entry;Ramped entrance     Home Layout: One level Home Equipment: Walker - 2 wheels;Electric scooter;Bedside commode;Tub bench;Hospital bed  Prior Function Level of Independence: Needs assistance   Gait / Transfers Assistance Needed: pt states she is able to take a couple steps with RW at baseline. Has to take about 5 steps with RW from bed to her bedroom threshold to then pivot to her scooter secondary to her scooter does not fit into her bedroom.  uses electric scooter for mobility, and BSC for toileting.   ADL's / Homemaking Assistance Needed: pt reported she used to have aide daily to help with this,  but it has been extremely hard since she does not have an aide now. difficulty emptying BSC and difficulty with urge incontinence due to lasix        Hand Dominance   Dominant Hand: Right    Extremity/Trunk Assessment   Upper Extremity Assessment: Defer to OT evaluation (triceps Rt 4/5, Lt 3+/5)           Lower Extremity Assessment: RLE deficits/detail;LLE deficits/detail RLE Deficits / Details: tested in supine; AAROM hip, knee, ankle WFL; hip flexion 2+, extension 3+, knee extension 3+ LLE Deficits / Details: tested in supine; AAROM hip, knee, ankle WFL; hip flexion 2, extension 3, knee extension 3+  Cervical / Trunk Assessment: Other exceptions  Communication   Communication: No difficulties  Cognition Arousal/Alertness: Awake/alert Behavior During Therapy: Flat affect Overall Cognitive Status: Within Functional Limits for tasks assessed                      General Comments      Exercises        Assessment/Plan    PT Assessment Patient needs continued PT services  PT Diagnosis Generalized weakness   PT Problem List Decreased strength;Decreased activity tolerance;Decreased balance;Decreased mobility;Decreased knowledge of use of DME  PT Treatment Interventions DME instruction;Gait training;Functional mobility training;Therapeutic activities;Therapeutic exercise;Balance training;Patient/family education   PT Goals (Current goals can be found in the Care Plan section) Acute Rehab PT Goals Patient Stated Goal: wants to regain strength and independence to ultimately return home PT Goal Formulation: With patient Time For Goal Achievement: 11/17/13 Potential to Achieve Goals: Good    Frequency Min 2X/week   Barriers to discharge Decreased caregiver support does not have aide or necessary support; repeated falls to floor    Co-evaluation               End of Session Equipment Utilized During Treatment: Gait belt Activity Tolerance: Patient  limited by fatigue Patient left: in bed;with call bell/phone within reach;with nursing/sitter in room Nurse Communication: Mobility status;Need for lift equipment (legs fatigue quickly with lift increasing safety)    Functional Assessment Tool Used: clinical judgement Functional Limitation: Mobility: Walking and moving around Mobility: Walking and Moving Around Current Status (H4742): At least 40 percent but less than 60 percent impaired, limited or restricted Mobility: Walking and Moving Around Goal Status [phone]): At least 1 percent but less than 20 percent impaired, limited or restricted    Time: 1111-1203 PT Time Calculation (min): 52 min   Charges:   PT Evaluation $Initial PT Evaluation Tier I: 1 Procedure PT Treatments $Therapeutic Activity: 38-52 mins   PT G Codes:   Functional Assessment Tool Used: clinical judgement Functional Limitation: Mobility: Walking and moving around    [employer] 11/03/2013, 12:28 PM Pager [phone]

## 2013-11-03 NOTE — ED Notes (Signed)
Wound Care RN in w/pt. 

## 2013-11-03 NOTE — ED Notes (Signed)
Becky RN came to get pt

## 2013-11-03 NOTE — ED Notes (Signed)
PHYSICAL THERAPY AT BEDSIDE

## 2013-11-03 NOTE — ED Notes (Signed)
All requested info sent to [facility] for the assignment of pt's [facility] number.  Pt can d/c to [facility] once we receive this number, hopefully by 5:00 pm today.

## 2013-11-03 NOTE — ED Notes (Signed)
Per ems-- pt reports she slid out of bed onto floor and was there 2 hours prior to ems arrival.  Pt wants placement for rehabilitation. ems reports they frequently have to come out to her house to get her off the floor. Vs stable.

## 2013-11-03 NOTE — ED Notes (Signed)
Social worker at bedside.

## 2013-11-03 NOTE — Consult Note (Addendum)
WOC wound consult note Reason for Consult: Consult requested for left leg wound.  Pt states she developed a blister last week and home health applied Profore compression wraps which are being changed 2X week. Wound type: Left calf with ruptured blister which has evolved into a full thickness wound. Measurement:6X6X.2cm Wound bed: Loose outer skin removed, revealing a layer of yellow gelatinous tissue which removes easily with scissors and forceps. Wound bed then evolved into 20% yellow, 80% red and moist. Drainage (amount, consistency, odor) Mod amt thick tan drainage from blister until nonviable skin removed, then small amt pink drainage, no odor. Periwound: Intact skin surrounding. Dressing procedure/placement/frequency: Applied foam dressing and Profore compression wraps since this was the previous plan of care.  Plan to change Friday if still in the hospital at that time. Julien Girt MSN, RN, [place], [place], [place]

## 2013-11-04 ENCOUNTER — Encounter: Payer: Self-pay | Admitting: Internal Medicine

## 2013-11-04 ENCOUNTER — Non-Acute Institutional Stay (SKILLED_NURSING_FACILITY): Payer: PRIVATE HEALTH INSURANCE | Admitting: Internal Medicine

## 2013-11-04 DIAGNOSIS — I10 Essential (primary) hypertension: Secondary | ICD-10-CM

## 2013-11-04 DIAGNOSIS — S91009A Unspecified open wound, unspecified ankle, initial encounter: Secondary | ICD-10-CM

## 2013-11-04 DIAGNOSIS — Z91199 Patient's noncompliance with other medical treatment and regimen due to unspecified reason: Secondary | ICD-10-CM

## 2013-11-04 DIAGNOSIS — D539 Nutritional anemia, unspecified: Secondary | ICD-10-CM

## 2013-11-04 DIAGNOSIS — S81802A Unspecified open wound, left lower leg, initial encounter: Secondary | ICD-10-CM | POA: Insufficient documentation

## 2013-11-04 DIAGNOSIS — S81009A Unspecified open wound, unspecified knee, initial encounter: Secondary | ICD-10-CM

## 2013-11-04 DIAGNOSIS — G35 Multiple sclerosis: Secondary | ICD-10-CM

## 2013-11-04 DIAGNOSIS — N319 Neuromuscular dysfunction of bladder, unspecified: Secondary | ICD-10-CM

## 2013-11-04 DIAGNOSIS — Z9119 Patient's noncompliance with other medical treatment and regimen: Secondary | ICD-10-CM

## 2013-11-04 DIAGNOSIS — I5022 Chronic systolic (congestive) heart failure: Secondary | ICD-10-CM

## 2013-11-04 DIAGNOSIS — S81809A Unspecified open wound, unspecified lower leg, initial encounter: Secondary | ICD-10-CM

## 2013-11-04 MED ORDER — NITROFURANTOIN MONOHYD MACRO 100 MG PO CAPS
100.0000 mg | ORAL_CAPSULE | Freq: Every day | ORAL | Status: DC
  Filled 2013-11-04: qty 1

## 2013-11-04 NOTE — ED Notes (Signed)
Per Education officer, museum they have received pt passar score and she has a bed at [facility]. sw to start discharge process .

## 2013-11-04 NOTE — Assessment & Plan Note (Signed)
Continue BBlocker , lasix, aldactone

## 2013-11-04 NOTE — Assessment & Plan Note (Signed)
Can no longer care for herself at home; gets treatment at outpt clinic; admitted for PT/OT

## 2013-11-04 NOTE — Progress Notes (Signed)
CSW has secure a [facility] number. [place] contacted. EDP informed discharged process started. Pt informed. PTAR arranged. CSW will fax information to [facility].   [address] [place]

## 2013-11-04 NOTE — Assessment & Plan Note (Signed)
Continue Coreg 25 BID, Lasix BID and aldactone daily

## 2013-11-04 NOTE — Progress Notes (Signed)
MRN: [id number] Name: Shannon Garrett  Sex: female Age: 53 y.o. DOB: 1960-07-14  [place] #: [facility] Facility/Room: 112P Level Of Care: SNF Provider: Hennie Duos Emergency Contacts: Extended Emergency Contact Information Primary Emergency Contact: Bennett,Debra          [place], [place] [address] [place] Phone: [phone] Relation: Sister  Code Status: FULL  Allergies: Sulfonamide derivatives and Penicillins  Chief Complaint  Patient presents with  . nursing home admission    HPI: Patient is 53 y.o. female who has MS and chronic pain, who can no longer take care of herself at home and is being admitted to SNF out of the ED for OT/PT.  Past Medical History  Diagnosis Date  . MS (multiple sclerosis)     a. Dx'd late 20's. b. Tx with Novantrone, Tysabri, Copaxone previously.  Marland Kitchen Hypertension   . Depression   . Glaucoma   . Cardiomyopathy     a.  Echo 04/29/12: Mild LVH, EF 20-25%, mild AI, moderate MR, moderate LAE, mild RAE, mild RVE, moderate TR, PASP 51, small pericardial effusion;   b. probably non-ischemic given multiple chemo-Tx agents used for MS and global LV dysfn on echo  . Chronic systolic heart failure   . CHF (congestive heart failure)   . Shortness of breath   . Neuromuscular disorder     MS    Past Surgical History  Procedure Laterality Date  . Ablation      uterine  . Cesarean section        Medication List       This list is accurate as of: 11/04/13  1:53 PM.  Always use your most recent med list.               amantadine 100 MG capsule  Commonly known as:  SYMMETREL  Take 100 mg by mouth 2 (two) times daily.     carvedilol 25 MG tablet  Commonly known as:  COREG  Take 25 mg by mouth 2 (two) times daily with a meal.     darifenacin 15 MG 24 hr tablet  Commonly known as:  ENABLEX  Take 15 mg by mouth 2 (two) times daily.     furosemide 80 MG tablet  Commonly known as:  LASIX  Take 160 mg by mouth daily. Take 160 mg  every morning and 80 mg po q HS     gabapentin 600 MG tablet  Commonly known as:  NEURONTIN  Take 600 mg by mouth 3 (three) times daily.     imipramine 100 MG capsule  Commonly known as:  TOFRANIL-PM  Take 100 mg by mouth at bedtime.     imipramine 50 MG tablet  Commonly known as:  TOFRANIL  Take 100 mg by mouth at bedtime.     multivitamin with minerals Tabs tablet  Take 1 tablet by mouth daily.     nitrofurantoin 100 MG capsule  Commonly known as:  MACRODANTIN  Take 100 mg by mouth daily.     oxyCODONE-acetaminophen 5-325 MG per tablet  Commonly known as:  PERCOCET  Take 1 tablet by mouth every 6 (six) hours as needed for moderate pain.     potassium chloride SA 20 MEQ tablet  Commonly known as:  K-DUR,KLOR-CON  20 mEq daily.     solifenacin 10 MG tablet  Commonly known as:  VESICARE  Take by mouth 2 (two) times daily.     spironolactone 25 MG tablet  Commonly  known as:  ALDACTONE  Take 25 mg by mouth daily.     Travoprost (BAK Free) 0.004 % Soln ophthalmic solution  Commonly known as:  TRAVATAN  Place 1 drop into both eyes at bedtime.     zolpidem 5 MG tablet  Commonly known as:  AMBIEN  Take 5 mg by mouth at bedtime.        Meds ordered this encounter  Medications  . darifenacin (ENABLEX) 15 MG 24 hr tablet    Sig: Take 15 mg by mouth 2 (two) times daily.  . solifenacin (VESICARE) 10 MG tablet    Sig: Take by mouth 2 (two) times daily.  Marland Kitchen imipramine (TOFRANIL-PM) 100 MG capsule    Sig: Take 100 mg by mouth at bedtime.    Immunization History  Administered Date(s) Administered  . Influenza,inj,Quad PF,36+ Mos 08/01/2013  . Pneumococcal Polysaccharide-23 08/01/2013    History  Substance Use Topics  . Smoking status: Former Research scientist (life sciences)  . Smokeless tobacco: Never Used  . Alcohol Use: No    Family history is noncontributory    Review of Systems  DATA OBTAINED: from patient, nurse GENERAL: Feels well no fevers, fatigue, appetite changes SKIN: No  itching, rash; L leg wound that started with a blister EYES: No eye pain, redness, discharge EARS: No earache, tinnitus, change in hearing NOSE: No congestion, drainage or bleeding  MOUTH/THROAT: No mouth or tooth pain, No sore throat, No difficulty chewing or swallowing  RESPIRATORY: No cough, wheezing, SOB CARDIAC: No chest pain, palpitations, lower extremity edema  GI: No abdominal pain, No N/V/D or constipation, No heartburn or reflux  GU: No dysuria, frequency or urgency, or incontinence  MUSCULOSKELETAL: No unrelieved bone/joint pain NEUROLOGIC: No headache, dizziness or focal weakness PSYCHIATRIC: No overt anxiety or sadness. Sleeps well. No behavior issue.   Filed Vitals:   11/04/13 1350  BP: 103/72  Pulse: 94  Temp: 97.8 F (36.6 C)  Resp: 20    Physical Exam  GENERAL APPEARANCE: Alert, conversant. Appropriately groomed. No acute distress.  SKIN: No diaphoresis rash; L leg -full thickness wound reported 6X6X 0.2, dressed HEAD: Normocephalic, atraumatic  EYES: Conjunctiva/lids clear. Pupils round, reactive. EOMs intact.  EARS: External exam WNL, canals clear. Hearing grossly normal.  NOSE: No deformity or discharge.  MOUTH/THROAT: Lips w/o lesions RESPIRATORY: Breathing is even, unlabored. Lung sounds are clear   CARDIOVASCULAR: Heart RRR no murmurs, rubs or gallops. 1+ R ankle peripheral edema.  GASTROINTESTINAL: Abdomen is soft, non-tender, not distended w/ normal bowel sounds GENITOURINARY: Bladder non tender, not distended  MUSCULOSKELETAL: MS NEUROLOGIC: Oriented X3. Cranial nerves 2-12 grossly intact; 1/5 LUE, LLE; 4/5 RUE PSYCHIATRIC: Mood and affect appropriate to situation, no behavioral issues  Patient Active Problem List   Diagnosis Date Noted  . Leg wound, left 11/04/2013  . FTT (failure to thrive) in adult 07/31/2013  . Medically noncompliant 07/31/2013  . Failure to thrive 07/31/2013  . Blisters of multiple sites 07/31/2013  . Generalized weakness  06/03/2013  . Insomnia 06/03/2013  . Neurogenic bladder 06/03/2013  . Rhabdomyolysis 05/25/2013  . Chronic diastolic heart failure 93/26/7124  . Elevated CK 09/10/2012  . Cellulitis of right upper extremity 09/10/2012  . Severe malnutrition 09/05/2012  . Paraplegia 09/05/2012  . Fall 09/04/2012  . Fever, unspecified 09/04/2012  . Chronic combined systolic and diastolic congestive heart failure 05/22/2012  . Acute on chronic systolic heart failure 58/03/9832  . Other primary cardiomyopathies 05/01/2012  . Acute respiratory failure 04/28/2012  . CHF exacerbation 04/28/2012  .  Immobility 04/28/2012  . OBESITY 11/30/2008  . ANEMIA, CHRONIC 11/30/2008  . MULTIPLE SCLEROSIS, PROGRESSIVE/RELAPSING 11/30/2008  . HYPERTENSION, BENIGN ESSENTIAL 11/30/2008  . ECZEMA 11/30/2008    CBC    Component Value Date/Time   WBC 9.7 11/03/2013 0244   RBC 3.82* 11/03/2013 0244   HGB 10.6* 11/03/2013 0244   HCT 31.1* 11/03/2013 0244   PLT 254 11/03/2013 0244   MCV 81.4 11/03/2013 0244   LYMPHSABS 2.3 11/03/2013 0244   MONOABS 1.0 11/03/2013 0244   EOSABS 0.5 11/03/2013 0244   BASOSABS 0.0 11/03/2013 0244    CMP     Component Value Date/Time   NA 143 11/03/2013 0244   K 3.4* 11/03/2013 0244   CL 97 11/03/2013 0244   CO2 33* 11/03/2013 0244   GLUCOSE 131* 11/03/2013 0244   BUN 18 11/03/2013 0244   CREATININE 1.11* 11/03/2013 0244   CALCIUM 9.5 11/03/2013 0244   PROT 7.5 11/03/2013 0244   ALBUMIN 3.5 11/03/2013 0244   AST 34 11/03/2013 0244   ALT 29 11/03/2013 0244   ALKPHOS 133* 11/03/2013 0244   BILITOT 0.3 11/03/2013 0244   GFRNONAA 57* 11/03/2013 0244   GFRAA 66* 11/03/2013 0244    Assessment and Plan  MULTIPLE SCLEROSIS, PROGRESSIVE/RELAPSING Can no longer care for herself at home; gets treatment at outpt clinic; admitted for PT/OT  Chronic combined systolic and diastolic congestive heart failure Continue BBlocker , lasix, aldactone  HYPERTENSION, BENIGN ESSENTIAL Continue Coreg 25 BID, Lasix BID and aldactone  daily  Neurogenic bladder Pt is on vesicare 10 mg BID  Medically noncompliant Hopefully will improve in SNF environment  Leg wound, left Was debrided in ED and dressed. Will continue wound care.  ANEMIA, CHRONIC Hb 10.g appears to be at baseline    Hennie Duos, MD

## 2013-11-04 NOTE — Assessment & Plan Note (Signed)
Hopefully will improve in SNF environment

## 2013-11-04 NOTE — Assessment & Plan Note (Signed)
Was debrided in ED and dressed. Will continue wound care.

## 2013-11-04 NOTE — Assessment & Plan Note (Signed)
Hb 10.g appears to be at baseline

## 2013-11-04 NOTE — ED Notes (Signed)
ptar here to transport 

## 2013-11-04 NOTE — ED Provider Notes (Signed)
Social Worker has made arrangements for pt to go to SNF. Will d/c.   Alfonzo Feller, DO 11/04/13 5732

## 2013-11-04 NOTE — Assessment & Plan Note (Signed)
Pt is on vesicare 10 mg BID

## 2013-11-17 ENCOUNTER — Ambulatory Visit: Payer: Self-pay | Admitting: Physician Assistant

## 2013-12-07 ENCOUNTER — Other Ambulatory Visit: Payer: Self-pay | Admitting: *Deleted

## 2013-12-07 MED ORDER — ZOLPIDEM TARTRATE 5 MG PO TABS: ORAL_TABLET | ORAL | Status: DC

## 2013-12-07 NOTE — Telephone Encounter (Signed)
[facility] 

## 2013-12-09 ENCOUNTER — Non-Acute Institutional Stay (SKILLED_NURSING_FACILITY): Payer: PRIVATE HEALTH INSURANCE | Admitting: Internal Medicine

## 2013-12-09 ENCOUNTER — Encounter: Payer: Self-pay | Admitting: Physician Assistant

## 2013-12-09 DIAGNOSIS — G35D Multiple sclerosis, unspecified: Secondary | ICD-10-CM

## 2013-12-09 DIAGNOSIS — G35 Multiple sclerosis: Secondary | ICD-10-CM

## 2013-12-09 DIAGNOSIS — I509 Heart failure, unspecified: Secondary | ICD-10-CM

## 2013-12-09 DIAGNOSIS — I5042 Chronic combined systolic (congestive) and diastolic (congestive) heart failure: Secondary | ICD-10-CM

## 2013-12-09 DIAGNOSIS — Z7409 Other reduced mobility: Secondary | ICD-10-CM

## 2013-12-09 DIAGNOSIS — N319 Neuromuscular dysfunction of bladder, unspecified: Secondary | ICD-10-CM

## 2013-12-09 DIAGNOSIS — I10 Essential (primary) hypertension: Secondary | ICD-10-CM

## 2013-12-09 DIAGNOSIS — R69 Illness, unspecified: Secondary | ICD-10-CM

## 2013-12-09 NOTE — Progress Notes (Signed)
Patient ID: Shannon Garrett, female   DOB: Mar 11, 1961, 53 y.o.   MRN: [id number]   This is an acute visit.  Level of care skilled.  Facility AF.  Chief complaint-acute visit to assess possible discharge tomorrow  HPI: Patient is 53 y.o. female who has MS and chronic pain, who had difficulty taking  care of herself at home and was being admitted to SNF out of the ED for OT/PT.--apparently she has gained strength and is able to do transfers-although she still has significant weakness and will need continued PT and OT--therapy still continues to have concerns about her ambulatory status-.  Patient is motivated to go home apparently at one point was thought to have 24-hour care but per discussion with her today this does not appear to be the case at the moment.  She will have a roommate although unclear exactly how much he would be around-she also will have CNA  support and will need home health nursing support as well-- She also will need primary care followup-apparently there has been some history of medical noncompliance in the past-this will have to be arranged before her discharge-she  does have a history of MS and apparently had been on medications previously including injectable although unclear exactly when this was last administered- this will need expedient follow up with her primary care provider This is somewhat complicated apparently her primary care provider has told her she will need to find a new provider secondary to past issues with noncompliance-staff here has been working on this and apparently has arranged an appointment on the 23rd of this month-I also discussed this with Dr. Sheppard Coil via phone and she will see patient tomorrow for followup.    Staff has noted some low blood pressures in the morning and have held her diuretics Aldactone and Lasix at times as well as Coreg-systolics have been as low in the high 80's at times--- this morning it was 107/68 and I took her blood  pressure early this afternoon and it was 142/82-  Per chart review it appears she is on current  doses of diuretics secondary to a history of combined CHF-most recent echo I see in March of 2014 showed an ejection fraction of 08-65%-HQI grade 1 diastolic dysfunction--apparently an earlier echo in 2013 showed an ejection fraction 20-25%  She also has a lower left leg wound that appears to be healing unremarkably she is on Macrobid once a day per wound care  does not appear to be infected    Past Medical History   Diagnosis  Date   .  MS (multiple sclerosis)         a. Dx'd late 20's. b. Tx with Novantrone, Tysabri, Copaxone previously.   Marland Kitchen  Hypertension     .  Depression     .  Glaucoma     .  Cardiomyopathy         a.  Echo 04/29/12: Mild LVH, EF 20-25%, mild AI, moderate MR, moderate LAE, mild RAE, mild RVE, moderate TR, PASP 51, small pericardial effusion;   b. probably non-ischemic given multiple chemo-Tx agents used for MS and global LV dysfn on echo   .  Chronic systolic heart failure     .  CHF (congestive heart failure)     .  Shortness of breath     .  Neuromuscular disorder         MS         Past Surgical History   Procedure  Laterality  Date   .  Ablation           uterine   .  Cesarean section                Medications    amantadine 100 MG capsule   Commonly known as:  SYMMETREL   Take 100 mg by mouth 2 (two) times daily.         carvedilol 25 MG tablet   Commonly known as:  COREG   Take 25 mg by mouth 2 (two) times daily with a meal.         darifenacin 15 MG 24 hr tablet   Commonly known as:  ENABLEX   Take 15 mg by mouth 2 (two) times daily.         furosemide 80 MG tablet   Commonly known as:  LASIX   Take 160 mg by mouth daily. Take 160 mg every morning and 80 mg po q HS         gabapentin 600 MG tablet   Commonly known as:  NEURONTIN   Take 600 mg by mouth 3 (three) times daily.         imipramine 100 MG capsule   Commonly known as:   TOFRANIL-PM   Take 100 mg by mouth at bedtime.         imipramine 50 MG tablet   Commonly known as:  TOFRANIL   Take 100 mg by mouth at bedtime.         multivitamin with minerals Tabs tablet   Take 1 tablet by mouth daily.         nitrofurantoin 100 MG capsule   Commonly known as:  MACRODANTIN   Take 100 mg by mouth daily.         oxyCODONE-acetaminophen 5-325 MG per tablet   Commonly known as:  PERCOCET   Take 1 tablet by mouth every 6 (six) hours as needed for moderate pain.         potassium chloride SA 20 MEQ tablet   Commonly known as:  K-DUR,KLOR-CON   20 mEq daily.         solifenacin 10 MG tablet   Commonly known as:  VESICARE   Take by mouth 2 (two) times daily.         spironolactone 25 MG tablet   Commonly known as:  ALDACTONE   Take 25 mg by mouth daily.         Travoprost (BAK Free) 0.004 % Soln ophthalmic solution   Commonly known as:  TRAVATAN   Place 1 drop into both eyes at bedtime.         zolpidem 5 MG tablet   Commonly known as:  AMBIEN   Take 5 mg by mouth at bedtime.prn insomnia        .  solifenacin (VESICARE) 10 MG tablet       Sig: Take by mouth 2 (two) times daily.   Marland Kitchen  imipramine (TOFRANIL-PM) 100 MG capsule       Sig: Take 100 mg by mouth at bedtime.                History   Substance Use Topics   .  Smoking status:  Former Research scientist (life sciences)   .  Smokeless tobacco:  Never Used   .  Alcohol Use:  No        Family history is noncontributory     Review of Systems  GENERAL: Feels well no fevers, fatigue, appetite changes SKIN: No itching, rash; has a wound in left shin area that is healing unremarkably per wound care EYES: No eye pain, redness, discharge EARS: No earache, tinnitus, change in hearing NOSE: No congestion, drainage or bleeding   MOUTH/THROAT: No mouth or tooth pain, No sore throat, No difficulty chewing or swallowing   RESPIRATORY: No cough, wheezing, SOB CARDIAC: No chest pain, palpitations,  baseline-improved  lower extremity edema   GI: No abdominal pain, No N/V/D or constipation, No heartburn or reflux   GU: No dysuria, frequency or urgency, or incontinence   MUSCULOSKELETAL: No unrelieved bone/joint pain NEUROLOGIC: No headache, dizziness or focal weakness PSYCHIATRIC: No overt anxiety or sadness. Sleeps well. No behavior issue.          Physical Exam Temperature 97.8 pulse 71 respirations 16 blood pressure 107/64 week has been relatively stable at 207.6   GENERAL APPEARANCE: Alert, conversant. Appropriately groomed. No acute distress.   SKIN: No diaphoresis rash; L leg -wound assessed with wound care there is granulation tissue no drainage bleeding this does not appear to be infected   EYES: Conjunctiva/lids clear. Pupils round, reactive. EOMs intact.   EARS: External exam WNL, canals clear. Hearing grossly normal.   NOSE: No deformity or discharge.   MOUTH/THROAT: Lips w/o lesions RESPIRATORY: Breathing is even, unlabored. Lung sounds are clear    CARDIOVASCULAR: Heart RRR no murmurs, rubs or gallops. 1+ R ankle peripheral edema.   GASTROINTESTINAL: Abdomen is  obese soft, non-tender, not distended w/ normal bowel sound   MUSCULOSKELETAL: MS NEUROLOGIC: Oriented X3. Cranial nerves 2-12 grossly intact; continues with lower extremity weakness is able to transfer PSYCHIATRIC: Mood and affect appropriate to situation, no behavioral issues    Patient Active Problem List     Diagnosis  Date Noted   .  Leg wound, left  11/04/2013   .  FTT (failure to thrive) in adult  07/31/2013   .  Medically noncompliant  07/31/2013   .  Failure to thrive  07/31/2013   .  Blisters of multiple sites  07/31/2013   .  Generalized weakness  06/03/2013   .  Insomnia  06/03/2013   .  Neurogenic bladder  06/03/2013   .  Rhabdomyolysis  05/25/2013   .  Chronic diastolic heart failure  40/34/7425   .  Elevated CK  09/10/2012   .  Cellulitis of right upper extremity  09/10/2012   .   Severe malnutrition  09/05/2012   .  Paraplegia  09/05/2012   .  Fall  09/04/2012   .  Fever, unspecified  09/04/2012   .  Chronic combined systolic and diastolic congestive heart failure  05/22/2012   .  Acute on chronic systolic heart failure  95/63/8756   .  Other primary cardiomyopathies  05/01/2012   .  Acute respiratory failure  04/28/2012   .  CHF exacerbation  04/28/2012   .  Immobility  04/28/2012   .  OBESITY  11/30/2008   .  ANEMIA, CHRONIC  11/30/2008   .  MULTIPLE SCLEROSIS, PROGRESSIVE/RELAPSING  11/30/2008   .  HYPERTENSION, BENIGN ESSENTIAL  11/30/2008   .  ECZEMA  11/30/2008        CBC     Component  Value  Date/Time     WBC  9.7  11/03/2013 0244     RBC  3.82*  11/03/2013 0244     HGB  10.6*  11/03/2013 0244  HCT  31.1*  11/03/2013 0244     PLT  254  11/03/2013 0244     MCV  81.4  11/03/2013 0244     LYMPHSABS  2.3  11/03/2013 0244     MONOABS  1.0  11/03/2013 0244     EOSABS  0.5  11/03/2013 0244     BASOSABS  0.0  11/03/2013 0244        CMP         Component  Value  Date/Time     NA  143  11/03/2013 0244     K  3.4*  11/03/2013 0244     CL  97  11/03/2013 0244     CO2  33*  11/03/2013 0244     GLUCOSE  131*  11/03/2013 0244     BUN  18  11/03/2013 0244     CREATININE  1.11*  11/03/2013 0244     CALCIUM  9.5  11/03/2013 0244     PROT  7.5  11/03/2013 0244     ALBUMIN  3.5  11/03/2013 0244     AST  34  11/03/2013 0244     ALT  29  11/03/2013 0244     ALKPHOS  133*  11/03/2013 0244     BILITOT  0.3  11/03/2013 0244     GFRNONAA  57*  11/03/2013 0244     GFRAA  66*  11/03/2013 0244        Assessment and Plan   MULTIPLE SCLEROSIS, PROGRESSIVE/RELAPSING admitted for PT/OT--she still has significant weakness and would benefit from continued therapy here--this was discussed with the therapy department-- apparently there are insurance issues however that is prompting this discharge- some trepidation here by the therapy department- this will need followup by Dr. Sheppard Coil tomorrow before  any possible discharge   Chronic combined systolic and diastolic congestive heart failure Continue BBlocker , lasix, aldactone   HYPERTENSION, BENIGN ESSENTIAL For now--continue , Lasix BID and aldactone daily--decrease Coreg to 12.5 mg twice a day-this plan was discussed with Dr. Sheppard Coil via phone--concerns with hypotensive readings at times in the morning-  History of CHF-as noted above will continue current diuretics for now with followup by Dr. Sheppard Coil tomorrow will decrease her Coreg to 12.5 mg twice a day   Neurogenic bladder Pt is on vesicare 10 mg BID-this has been stable   Medically noncompliant This has not really been an issue during her stay here-however there is concern with her going back home but this again could be an issue-I feel she needs to be follow up with primary care provider but apparently there have been issues with noncompliance in the past and she will have to find a new primary care provider per previous provider--- this may complicate her eventual discharge   Leg wound, left Was debrided in ED and dressed. Will continue wound care.--I suspect this will need to be done by home health if she is discharged--we'll discontinue Macrobid   ANEMIA, CHRONIC Hb 10.g appears to be at baseline--will need to update this before discharge  We'll need to update CBC and CMP before discharge-she will need PT and OT support certainly for continued weakness as well CNA to help with activities of daily living-also hold health support to followup her medical issues including the leg wound-also will need expedient followup by her primary care provider this will have to be arranged before discharge--  At this point secondary to numerous issues as stated above will not write discharge  orders but will await Dr. Redgie Grayer input tomorrow.  Clinically she does appear to be stable although there certainly concerns here with primary care followup-her limited ambulation--question  home support--as well as some low blood pressure readings that appear to be relatively asymptomatic.  [id number] note greater than 50 minutes spent assessing patient-discussing patient's status with her as well as with nursing staff and therapy staff and social work-and coordinating a plan of care-of note greater than 50% of time spent coordinating plan of care  .

## 2013-12-10 ENCOUNTER — Non-Acute Institutional Stay (SKILLED_NURSING_FACILITY): Payer: PRIVATE HEALTH INSURANCE | Admitting: Internal Medicine

## 2013-12-10 ENCOUNTER — Encounter: Payer: Self-pay | Admitting: Internal Medicine

## 2013-12-10 DIAGNOSIS — G35 Multiple sclerosis: Secondary | ICD-10-CM

## 2013-12-10 DIAGNOSIS — I10 Essential (primary) hypertension: Secondary | ICD-10-CM

## 2013-12-10 DIAGNOSIS — D539 Nutritional anemia, unspecified: Secondary | ICD-10-CM

## 2013-12-10 DIAGNOSIS — I5042 Chronic combined systolic (congestive) and diastolic (congestive) heart failure: Secondary | ICD-10-CM

## 2013-12-10 DIAGNOSIS — Z9119 Patient's noncompliance with other medical treatment and regimen: Secondary | ICD-10-CM

## 2013-12-10 DIAGNOSIS — N319 Neuromuscular dysfunction of bladder, unspecified: Secondary | ICD-10-CM

## 2013-12-10 DIAGNOSIS — I509 Heart failure, unspecified: Secondary | ICD-10-CM

## 2013-12-10 DIAGNOSIS — Z91199 Patient's noncompliance with other medical treatment and regimen due to unspecified reason: Secondary | ICD-10-CM

## 2013-12-10 NOTE — Progress Notes (Signed)
MRN: [id number] Name: Shannon Garrett  Sex: female Age: 53 y.o. DOB: 11-25-1960  [place] #:  Facility/Room: Level Of Care: SNF Provider: Inocencio Homes D Emergency Contacts: Extended Emergency Contact Information Primary Emergency Contact: Starr, [place] [address] [place] Phone: [phone] Relation: Sister Secondary Emergency Contact: Jefm Bryant, [place] Phone: [phone] Relation: Friend  Code Status:FULL   Allergies: Sulfonamide derivatives and Penicillins  Chief Complaint  Patient presents with  . Discharge Note    HPI: Patient is 53 y.o. female with MS who was admitted for OT/PT who is now ready to be d/c to home.  Past Medical History  Diagnosis Date  . MS (multiple sclerosis)     a. Dx'd late 20's. b. Tx with Novantrone, Tysabri, Copaxone previously.  Marland Kitchen Hypertension   . Depression   . Glaucoma   . Cardiomyopathy     a.  Echo 04/29/12: Mild LVH, EF 20-25%, mild AI, moderate MR, moderate LAE, mild RAE, mild RVE, moderate TR, PASP 51, small pericardial effusion;   b. probably non-ischemic given multiple chemo-Tx agents used for MS and global LV dysfn on echo  . Chronic systolic heart failure   . CHF (congestive heart failure)   . Shortness of breath   . Neuromuscular disorder     MS    Past Surgical History  Procedure Laterality Date  . Ablation      uterine  . Cesarean section        Medication List       This list is accurate as of: 12/10/13  1:05 PM.  Always use your most recent med list.               amantadine 100 MG capsule  Commonly known as:  SYMMETREL  Take 100 mg by mouth 2 (two) times daily.     carvedilol 25 MG tablet  Commonly known as:  COREG  Take 12.5 mg by mouth 2 (two) times daily with a meal.     docusate sodium 100 MG capsule  Commonly known as:  COLACE  Take 100 mg by mouth 2 (two) times daily.     furosemide 80 MG tablet  Commonly  known as:  LASIX  Take 160 mg by mouth daily. Take 160 mg every morning and 80 mg po q HS     gabapentin 600 MG tablet  Commonly known as:  NEURONTIN  Take 600 mg by mouth 3 (three) times daily.     imipramine 100 MG capsule  Commonly known as:  TOFRANIL-PM  Take 100 mg by mouth at bedtime.     multivitamin with minerals Tabs tablet  Take 1 tablet by mouth daily.     oxyCODONE-acetaminophen 5-325 MG per tablet  Commonly known as:  PERCOCET  Take 1 tablet by mouth every 6 (six) hours as needed for moderate pain.     polyethylene glycol packet  Commonly known as:  MIRALAX / GLYCOLAX  Take 17 g by mouth daily.     potassium chloride SA 20 MEQ tablet  Commonly known as:  K-DUR,KLOR-CON  20 mEq daily.     solifenacin 10 MG tablet  Commonly known as:  VESICARE  Take by mouth 2 (two) times daily.     spironolactone 25 MG tablet  Commonly known as:  ALDACTONE  Take 25 mg by mouth daily.  XALATAN 0.005 % ophthalmic solution  Generic drug:  latanoprost  Place 1 drop into both eyes at bedtime.     zolpidem 5 MG tablet  Commonly known as:  AMBIEN  Take one tablet by mouth at bedtime as needed for rest        Meds ordered this encounter  Medications  . latanoprost (XALATAN) 0.005 % ophthalmic solution    Sig: Place 1 drop into both eyes at bedtime.    Immunization History  Administered Date(s) Administered  . Influenza,inj,Quad PF,36+ Mos 08/01/2013  . Pneumococcal Polysaccharide-23 08/01/2013    History  Substance Use Topics  . Smoking status: Former Research scientist (life sciences)  . Smokeless tobacco: Never Used  . Alcohol Use: No    Filed Vitals:   12/10/13 1223  BP: 125/81  Pulse: 78  Temp: 97.8 F (36.6 C)  Resp: 20    Physical Exam  GENERAL APPEARANCE: Alert, conversant. Appropriately groomed. No acute distress.  HEENT: Unremarkable. RESPIRATORY: Breathing is even, unlabored. Lung sounds are clear   CARDIOVASCULAR: Heart RRR no murmurs, rubs or gallops. No peripheral  edema.  GASTROINTESTINAL: Abdomen is soft, non-tender, not distended w/ normal bowel sounds.  NEUROLOGIC: Cranial nerves 2-12 grossly intact. Moves UE  Patient Active Problem List   Diagnosis Date Noted  . Leg wound, left 11/04/2013  . FTT (failure to thrive) in adult 07/31/2013  . Medically noncompliant 07/31/2013  . Failure to thrive 07/31/2013  . Blisters of multiple sites 07/31/2013  . Generalized weakness 06/03/2013  . Insomnia 06/03/2013  . Neurogenic bladder 06/03/2013  . Rhabdomyolysis 05/25/2013  . Chronic diastolic heart failure 86/76/1950  . Elevated CK 09/10/2012  . Cellulitis of right upper extremity 09/10/2012  . Severe malnutrition 09/05/2012  . Paraplegia 09/05/2012  . Fall 09/04/2012  . Fever, unspecified 09/04/2012  . Chronic combined systolic and diastolic congestive heart failure 05/22/2012  . Acute on chronic systolic heart failure 93/26/7124  . Other primary cardiomyopathies 05/01/2012  . Acute respiratory failure 04/28/2012  . CHF exacerbation 04/28/2012  . Immobility 04/28/2012  . OBESITY 11/30/2008  . ANEMIA, CHRONIC 11/30/2008  . MULTIPLE SCLEROSIS, PROGRESSIVE/RELAPSING 11/30/2008  . HYPERTENSION, BENIGN ESSENTIAL 11/30/2008  . ECZEMA 11/30/2008    CBC    Component Value Date/Time   WBC 9.7 11/03/2013 0244   RBC 3.82* 11/03/2013 0244   HGB 10.6* 11/03/2013 0244   HCT 31.1* 11/03/2013 0244   PLT 254 11/03/2013 0244   MCV 81.4 11/03/2013 0244   LYMPHSABS 2.3 11/03/2013 0244   MONOABS 1.0 11/03/2013 0244   EOSABS 0.5 11/03/2013 0244   BASOSABS 0.0 11/03/2013 0244    CMP     Component Value Date/Time   NA 143 11/03/2013 0244   K 3.4* 11/03/2013 0244   CL 97 11/03/2013 0244   CO2 33* 11/03/2013 0244   GLUCOSE 131* 11/03/2013 0244   BUN 18 11/03/2013 0244   CREATININE 1.11* 11/03/2013 0244   CALCIUM 9.5 11/03/2013 0244   PROT 7.5 11/03/2013 0244   ALBUMIN 3.5 11/03/2013 0244   AST 34 11/03/2013 0244   ALT 29 11/03/2013 0244   ALKPHOS 133* 11/03/2013 0244   BILITOT 0.3  11/03/2013 0244   GFRNONAA 57* 11/03/2013 0244   GFRAA 66* 11/03/2013 0244    Assessment and Plan  Pt is in improved and stable condition to be discharged to home.  Hennie Duos, MD

## 2013-12-15 DIAGNOSIS — I5042 Chronic combined systolic (congestive) and diastolic (congestive) heart failure: Secondary | ICD-10-CM

## 2013-12-15 DIAGNOSIS — L97809 Non-pressure chronic ulcer of other part of unspecified lower leg with unspecified severity: Secondary | ICD-10-CM

## 2013-12-15 DIAGNOSIS — F329 Major depressive disorder, single episode, unspecified: Secondary | ICD-10-CM

## 2013-12-15 DIAGNOSIS — I10 Essential (primary) hypertension: Secondary | ICD-10-CM

## 2013-12-15 DIAGNOSIS — F3289 Other specified depressive episodes: Secondary | ICD-10-CM

## 2013-12-15 DIAGNOSIS — G35 Multiple sclerosis: Secondary | ICD-10-CM

## 2013-12-15 DIAGNOSIS — D649 Anemia, unspecified: Secondary | ICD-10-CM

## 2014-01-18 ENCOUNTER — Ambulatory Visit: Payer: Self-pay | Admitting: Physician Assistant

## 2014-02-09 ENCOUNTER — Encounter: Payer: Self-pay | Admitting: Physician Assistant

## 2014-02-09 ENCOUNTER — Ambulatory Visit (INDEPENDENT_AMBULATORY_CARE_PROVIDER_SITE_OTHER): Payer: PRIVATE HEALTH INSURANCE | Admitting: Physician Assistant

## 2014-02-09 VITALS — BP 130/80 | HR 86 | Ht 64.0 in | Wt 205.0 lb

## 2014-02-09 DIAGNOSIS — I428 Other cardiomyopathies: Secondary | ICD-10-CM

## 2014-02-09 DIAGNOSIS — I10 Essential (primary) hypertension: Secondary | ICD-10-CM

## 2014-02-09 DIAGNOSIS — S80822A Blister (nonthermal), left lower leg, initial encounter: Secondary | ICD-10-CM

## 2014-02-09 DIAGNOSIS — I5032 Chronic diastolic (congestive) heart failure: Secondary | ICD-10-CM

## 2014-02-09 DIAGNOSIS — R0602 Shortness of breath: Secondary | ICD-10-CM

## 2014-02-09 DIAGNOSIS — IMO0002 Reserved for concepts with insufficient information to code with codable children: Secondary | ICD-10-CM

## 2014-02-09 MED ORDER — DOXYCYCLINE HYCLATE 100 MG PO CAPS: 100.0000 mg | ORAL_CAPSULE | Freq: Two times a day (BID) | ORAL | Status: DC

## 2014-02-09 NOTE — Progress Notes (Signed)
Cardiology Office Note   Date:  02/09/2014   ID:  Shannon Garrett, DOB 1961-01-12, MRN [id number]  PCP:  Shannon Chou, NP  Cardiologist:  Dr. Sherren Garrett     History of Present Illness: Shannon Garrett is a 53 y.o. female with a hx of multiple sclerosis, HTN and depression. She is limited by her multiple sclerosis and is "scooter-bound." Echo in 6/12 demonstrated normal LVF. She has been treated with Novantrone, Tysabri, Copaxone, Symmetrel and Dimethyl Fumerate for her MS. She was admitted 04/2012 with new onset systolic CHF.  Echo 04/29/12: Mild LVH, EF 20-25%, mild AI, moderate MR, moderate LAE, mild RAE, mild RVE, moderate TR, PASP 51, small pericardial effusion. Overall it has been felt that her cardiomyopathy is likely nonischemic. She has been exposed to multiple chemotherapeutic agents in the past  which can cause CHF months to years after discontinuation. Medical therapy was recommended for CHF. Ischemic evaluation was not felt to be necessary. Her EF has improved with med Rx.  Echo 3/14: mild LVH, EF 54-65%, grade 1 diastolic dysfunction, mild AI, MAC, mild LAE.  Last seen here in 05/2013.  She returns for FU.  She arrived 1 hour late today.  She is here alone.  She relies on medical transport.  She was seen in the ED in 10/2013 for FTT and stayed in SNF for some time.  She has had difficulty with LE edema.  She is now on a mega dose of Lasix (160 in A and 80 in P). She has had LE wounds and was seen in a wound clinic at one time.  R LE wound is healed.  She has a large blister on the L leg.  She notes dyspnea with transfers.  She denies orthopnea, PND.  Denies chest pain.  Denies syncope.    Recent Labs: 05/25/2013: TSH 3.187  10/14/2013: Pro B Natriuretic peptide (BNP) 57.4  11/03/2013: ALT 29; Creatinine 1.11*; Hemoglobin 10.6*; Potassium 3.4*   Wt Readings from Last 3 Encounters:  02/09/14 205 lb (92.987 kg)  11/04/13 205 lb 0.4 oz (93 kg)  10/26/13 205 lb (92.987 kg)     Past  Medical History  Diagnosis Date  . MS (multiple sclerosis)     a. Dx'd late 20's. b. Tx with Novantrone, Tysabri, Copaxone previously.  Marland Kitchen Hypertension   . Depression   . Glaucoma   . Cardiomyopathy     a.  Echo 04/29/12: Mild LVH, EF 20-25%, mild AI, moderate MR, moderate LAE, mild RAE, mild RVE, moderate TR, PASP 51, small pericardial effusion;   b. probably non-ischemic given multiple chemo-Tx agents used for MS and global LV dysfn on echo  . Chronic systolic heart failure   . CHF (congestive heart failure)   . Shortness of breath   . Neuromuscular disorder     MS    Current Outpatient Prescriptions  Medication Sig Dispense Refill  . amantadine (SYMMETREL) 100 MG capsule Take 100 mg by mouth 2 (two) times daily.       . carvedilol (COREG) 25 MG tablet Take 12.5 mg by mouth 2 (two) times daily with a meal.       . docusate sodium (COLACE) 100 MG capsule Take 100 mg by mouth 2 (two) times daily.      . furosemide (LASIX) 80 MG tablet Take 160 mg by mouth daily. Take 160 mg every morning and 80 mg po q HS      . gabapentin (NEURONTIN) 600 MG tablet Take 600 mg  by mouth 3 (three) times daily.       Marland Kitchen imipramine (TOFRANIL-PM) 100 MG capsule Take 100 mg by mouth at bedtime.      Marland Kitchen latanoprost (XALATAN) 0.005 % ophthalmic solution Place 1 drop into both eyes at bedtime.      . Multiple Vitamin (MULTIVITAMIN WITH MINERALS) TABS tablet Take 1 tablet by mouth daily.      Marland Kitchen oxyCODONE-acetaminophen (PERCOCET) 5-325 MG per tablet Take 1 tablet by mouth every 6 (six) hours as needed for moderate pain.  20 tablet  0  . polyethylene glycol (MIRALAX / GLYCOLAX) packet Take 17 g by mouth daily.      . potassium chloride SA (K-DUR,KLOR-CON) 20 MEQ tablet 20 mEq daily.       . solifenacin (VESICARE) 10 MG tablet Take by mouth 2 (two) times daily.      Marland Kitchen spironolactone (ALDACTONE) 25 MG tablet Take 25 mg by mouth daily.      Marland Kitchen zolpidem (AMBIEN) 5 MG tablet Take one tablet by mouth at bedtime as needed  for rest  30 tablet  5   No current facility-administered medications for this visit.    Allergies:   Sulfonamide derivatives and Penicillins   Social History:  The patient  reports that she has quit smoking. She has never used smokeless tobacco. She reports that she does not drink alcohol or use illicit drugs.   Family History:  The patient's family history includes Hypertension in her mother. There is no history of Heart attack.   ROS:  Please see the history of present illness.  She notes recent symptoms of UTI.  She takes nitrofurantoin for frequent UTIs.  All other systems reviewed and negative.   PHYSICAL EXAM: VS:  BP 130/80  Pulse 86  Ht 5\' 4"  (1.626 m)  Wt 205 lb (92.987 kg)  BMI 35.17 kg/m2 Well nourished, well developed, in no acute distress HEENT: normal Neck: no JVD at 90 Cardiac:  normal S1, S2; RRR; 0-3/4 systolic murmur RUSB Lungs:  clear to auscultation bilaterally, no wheezing, rhonchi or rales Abd: soft, nontender, no hepatomegaly Ext: 1+ bilateral LE edemavery Lg blister (golf ball size) on LLE with surrounding erythema and pain Skin: warm and dry Neuro:  CNs 2-12 intact, no focal abnormalities noted  EKG:  NSR, HR 86, normal axis, no ST changes, QTc 478      ASSESSMENT AND PLAN:  Edema:  Her edema is likely related to venous insufficiency and poor muscle tone from MS.  She is developing signs of venous stasis.  She had a wound on the R leg that healed.  She now has a large blister on the left leg.  I am concerned she may have some cellulitis as well. She is allergic to PCN and sulfa.  I will start her on Doxycycline 100 bid x 1 week.  I will refer her to the wound clinic.    Cardiomyopathy:   As noted, she has had recovery of LVF on current Rx.  She is having more trouble with dyspnea and LE edema.  I will repeat her echo to reassess her EF.      Chronic Diastolic CHF:  As noted, I suspect her edema is related to venous insufficiency more than HF.  Repeat  echo as noted.  She is on a very high dose of Lasix at this time.  Check BMET and BNP.  If her BNP is high, consider changing Lasix to Demadex 40 bid.    Multiple  Sclerosis:  I am not certain she has FU with neurology.  She tells me she had a neurologist in [place].  Then she says she sees someone at [facility].    Hypertension:  Controlled.   Disposition:  She is not clear if she has a PCP.  She wants an order signed to continue her aid.  She plans to re-establish with someone she has in mind.  I have asked her to go ahead and do this.  F/u with me in 1 month.    Signed, Versie Starks, MHS 02/09/2014 3:58 PM    [facility] [place], [place], [place]  [address] Phone: [phone]; Fax: [phone]

## 2014-02-09 NOTE — Patient Instructions (Signed)
Your physician recommends that you schedule a follow-up appointment in: [place], [place] OFFICE  Your physician has requested that you have an echocardiogram. Echocardiography is a painless test that uses sound waves to create images of your heart. It provides your doctor with information about the size and shape of your heart and how well your heart's chambers and valves are working. This procedure takes approximately one hour. There are no restrictions for this procedure.  You have been referred to WOUND CLINIC DX LLE BLISTER/WOUND  LAB WORK TODAY; BMET, BNP

## 2014-02-10 ENCOUNTER — Telehealth: Payer: Self-pay | Admitting: *Deleted

## 2014-02-10 DIAGNOSIS — I5023 Acute on chronic systolic (congestive) heart failure: Secondary | ICD-10-CM

## 2014-02-10 DIAGNOSIS — I5032 Chronic diastolic (congestive) heart failure: Secondary | ICD-10-CM

## 2014-02-10 LAB — BRAIN NATRIURETIC PEPTIDE: PRO B NATRI PEPTIDE: 34 pg/mL (ref 0.0–100.0)

## 2014-02-10 LAB — BASIC METABOLIC PANEL
BUN: 9 mg/dL (ref 6–23)
CALCIUM: 9.1 mg/dL (ref 8.4–10.5)
CO2: 32 mEq/L (ref 19–32)
CREATININE: 1 mg/dL (ref 0.4–1.2)
Chloride: 99 mEq/L (ref 96–112)
GFR: 78.72 mL/min (ref >60.00)
Glucose, Bld: 108 mg/dL — ABNORMAL HIGH (ref 70–99)
Potassium: 3.4 mEq/L — ABNORMAL LOW (ref 3.5–5.1)
Sodium: 138 mEq/L (ref 135–145)

## 2014-02-10 MED ORDER — POTASSIUM CHLORIDE CRYS ER 20 MEQ PO TBCR: 40.0000 meq | EXTENDED_RELEASE_TABLET | Freq: Every day | ORAL | Status: DC

## 2014-02-10 NOTE — Telephone Encounter (Signed)
lmptcb for lab results and to increase K+ to 40 meq daily. I will try again tomorrow to reach pt.

## 2014-02-11 NOTE — Telephone Encounter (Signed)
s/w pt's sister [name] since I could not reach pt on her home # and no machine to lm. [name] gave me cell # for pt [phone], I updated pt's chart and I will call pt on cell w/results and changes.

## 2014-02-12 NOTE — Telephone Encounter (Signed)
I left detailed message on pt's cell that I just got last night from pt's sister [name]. To increase K+ to 40 meq daily, need bmet 2 weeks. Lmom for ptcb 8/17 to go over results and to make sure she did rcv message to increase K+ today.

## 2014-02-14 ENCOUNTER — Encounter (HOSPITAL_COMMUNITY): Payer: Self-pay | Admitting: Emergency Medicine

## 2014-02-14 ENCOUNTER — Emergency Department (HOSPITAL_COMMUNITY)
Admission: EM | Admit: 2014-02-14 | Discharge: 2014-02-14 | Disposition: A | Discharge status: Home / Self Care | Payer: PRIVATE HEALTH INSURANCE | Attending: Emergency Medicine | Admitting: Emergency Medicine

## 2014-02-14 DIAGNOSIS — I10 Essential (primary) hypertension: Secondary | ICD-10-CM | POA: Insufficient documentation

## 2014-02-14 DIAGNOSIS — Z792 Long term (current) use of antibiotics: Secondary | ICD-10-CM | POA: Diagnosis not present

## 2014-02-14 DIAGNOSIS — L98499 Non-pressure chronic ulcer of skin of other sites with unspecified severity: Secondary | ICD-10-CM | POA: Diagnosis not present

## 2014-02-14 DIAGNOSIS — F329 Major depressive disorder, single episode, unspecified: Secondary | ICD-10-CM | POA: Insufficient documentation

## 2014-02-14 DIAGNOSIS — M79609 Pain in unspecified limb: Secondary | ICD-10-CM | POA: Insufficient documentation

## 2014-02-14 DIAGNOSIS — Z8669 Personal history of other diseases of the nervous system and sense organs: Secondary | ICD-10-CM | POA: Diagnosis not present

## 2014-02-14 DIAGNOSIS — F3289 Other specified depressive episodes: Secondary | ICD-10-CM | POA: Insufficient documentation

## 2014-02-14 DIAGNOSIS — Z880 Allergy status to penicillin: Secondary | ICD-10-CM | POA: Insufficient documentation

## 2014-02-14 DIAGNOSIS — I5022 Chronic systolic (congestive) heart failure: Secondary | ICD-10-CM | POA: Insufficient documentation

## 2014-02-14 DIAGNOSIS — I872 Venous insufficiency (chronic) (peripheral): Secondary | ICD-10-CM | POA: Insufficient documentation

## 2014-02-14 DIAGNOSIS — Z87891 Personal history of nicotine dependence: Secondary | ICD-10-CM | POA: Insufficient documentation

## 2014-02-14 DIAGNOSIS — Z79899 Other long term (current) drug therapy: Secondary | ICD-10-CM | POA: Diagnosis not present

## 2014-02-14 DIAGNOSIS — IMO0001 Reserved for inherently not codable concepts without codable children: Secondary | ICD-10-CM

## 2014-02-14 MED ORDER — OXYCODONE-ACETAMINOPHEN 5-325 MG PO TABS: 1.0000 | ORAL_TABLET | ORAL | Status: DC | PRN

## 2014-02-14 MED ORDER — OXYCODONE-ACETAMINOPHEN 5-325 MG PO TABS
1.0000 | ORAL_TABLET | Freq: Once | ORAL | Status: AC
  Administered 2014-02-14: 1 via ORAL
  Filled 2014-02-14: qty 1

## 2014-02-14 NOTE — ED Notes (Signed)
PT c/o of left leg pain. Wound care was suppose to visit this week. Edema, pain worse. Weeping fluids.

## 2014-02-14 NOTE — ED Provider Notes (Signed)
TIME SEEN: 2:00 PM  CHIEF COMPLAINT: Left leg ulcer  HPI: Patient is a 53 y.o. F with history of multiple sclerosis, hypertension, cardiomyopathy, peripheral vascular disease who presents to the emergency department with a left leg ulcer that is been present for the past week. She states she has a history of chronic lower extremity ulcers. She was seen by her cardiologist who recommended home wound care. She states that no one wound care has come to see her this week. She states that she is having pain and swelling at this area. It is leaking clear fluid. No erythema, warmth, or purulent drainage. No fever.  ROS: See HPI Constitutional: no fever  Eyes: no drainage  ENT: no runny nose   Cardiovascular:  no chest pain  Resp: no SOB  GI: no vomiting GU: no dysuria Integumentary: no rash  Allergy: no hives  Musculoskeletal: no leg swelling  Neurological: no slurred speech ROS otherwise negative  PAST MEDICAL HISTORY/PAST SURGICAL HISTORY:  Past Medical History  Diagnosis Date  . MS (multiple sclerosis)     a. Dx'd late 20's. b. Tx with Novantrone, Tysabri, Copaxone previously.  Marland Kitchen Hypertension   . Depression   . Glaucoma   . Cardiomyopathy     a.  Echo 04/29/12: Mild LVH, EF 20-25%, mild AI, moderate MR, moderate LAE, mild RAE, mild RVE, moderate TR, PASP 51, small pericardial effusion;   b. probably non-ischemic given multiple chemo-Tx agents used for MS and global LV dysfn on echo  . Chronic systolic heart failure   . CHF (congestive heart failure)   . Shortness of breath   . Neuromuscular disorder     MS    MEDICATIONS:  Prior to Admission medications   Medication Sig Start Date End Date Taking? Authorizing Provider  amantadine (SYMMETREL) 100 MG capsule Take 100 mg by mouth 2 (two) times daily.    Yes Historical Provider, MD  polyethylene glycol (MIRALAX / GLYCOLAX) packet Take 17 g by mouth daily.   Yes Historical Provider, MD  carvedilol (COREG) 25 MG tablet Take 12.5 mg by  mouth 2 (two) times daily with a meal.     Historical Provider, MD  docusate sodium (COLACE) 100 MG capsule Take 100 mg by mouth 2 (two) times daily.    Historical Provider, MD  doxycycline (VIBRAMYCIN) 100 MG capsule Take 1 capsule (100 mg total) by mouth 2 (two) times daily. 02/09/14   Liliane Shi, PA-C  furosemide (LASIX) 80 MG tablet Take 160 mg by mouth daily. Take 160 mg every morning and 80 mg po q HS    Historical Provider, MD  gabapentin (NEURONTIN) 600 MG tablet Take 600 mg by mouth 3 (three) times daily.     Historical Provider, MD  imipramine (TOFRANIL-PM) 100 MG capsule Take 100 mg by mouth at bedtime.    Historical Provider, MD  latanoprost (XALATAN) 0.005 % ophthalmic solution Place 1 drop into both eyes at bedtime.    Historical Provider, MD  Multiple Vitamin (MULTIVITAMIN WITH MINERALS) TABS tablet Take 1 tablet by mouth daily.    Historical Provider, MD  oxyCODONE-acetaminophen (PERCOCET) 5-325 MG per tablet Take 1 tablet by mouth every 6 (six) hours as needed for moderate pain. 10/14/13   Evelina Bucy, MD  potassium chloride SA (K-DUR,KLOR-CON) 20 MEQ tablet Take 2 tablets (40 mEq total) by mouth daily. 02/10/14   Liliane Shi, PA-C  solifenacin (VESICARE) 10 MG tablet Take by mouth 2 (two) times daily.    Historical Provider,  MD  spironolactone (ALDACTONE) 25 MG tablet Take 25 mg by mouth daily.    Historical Provider, MD  zolpidem (AMBIEN) 5 MG tablet Take one tablet by mouth at bedtime as needed for rest 12/07/13   Gayland Curry, DO    ALLERGIES:  Allergies  Allergen Reactions  . Sulfonamide Derivatives Hives and Shortness Of Breath  . Penicillins Hives and Itching    SOCIAL HISTORY:  History  Substance Use Topics  . Smoking status: Former Research scientist (life sciences)  . Smokeless tobacco: Never Used  . Alcohol Use: No    FAMILY HISTORY: Family History  Problem Relation Age of Onset  . Hypertension Mother   . Heart attack Neg Hx     EXAM: BP 150/46  Pulse 105  Temp(Src) 99.2  F (37.3 C) (Oral)  Resp 16  SpO2 100% CONSTITUTIONAL: Alert and oriented and responds appropriately to questions. Well-appearing; well-nourished HEAD: Normocephalic EYES: Conjunctivae clear, PERRL ENT: normal nose; no rhinorrhea; moist mucous membranes; pharynx without lesions noted NECK: Supple, no meningismus, no LAD  CARD: RRR; S1 and S2 appreciated; no murmurs, no clicks, no rubs, no gallops RESP: Normal chest excursion without splinting or tachypnea; breath sounds clear and equal bilaterally; no wheezes, no rhonchi, no rales,  ABD/GI: Normal bowel sounds; non-distended; soft, non-tender, no rebound, no guarding BACK:  The back appears normal and is non-tender to palpation, there is no CVA tenderness EXT: Normal ROM in all joints; non-tender to palpation; no edema; normal capillary refill; no cyanosis; patient has a 4 x 3 cm open blister to her distal left lateral lower extremity with no purulent drainage, no surrounding erythema or warmth, no induration, no fluctuance, 2+ DP pulses bilaterally; no calf tenderness, no obvious swelling of her left lower extremity when compared to the right    SKIN: Normal color for age and race; warm NEURO: Moves all extremities equally PSYCH: The patient's mood and manner are appropriate. Grooming and personal hygiene are appropriate.  MEDICAL DECISION MAKING: Patient here with a venous stasis ulcer. She states she's had any in the past. She is here because she wants her wound evaluated and once home health wound care set up. We'll have case management see patient for outpatient resources. I do not feel she needs admission or labs at this time. There is no sign of infection. She is currently on doxycycline. We'll give pain medication. Anticipate discharge home with outpatient resources. Will clean and dress her wound. Patient comfortable with this plan. Discussed return precautions.     [place], DO 02/14/14 [id number]

## 2014-02-14 NOTE — Progress Notes (Signed)
CARE MANAGEMENT NOTE 02/14/2014  Patient:  Shannon Garrett, Shannon Garrett   Account Number:  [id number]  Date Initiated:  02/14/2014  Documentation initiated by:  [facility]  Subjective/Objective Assessment:   MS     Action/Plan:   lives at home alone   Anticipated DC Date:  02/14/2014   Anticipated DC Plan:  [place]  CM consult      [facility] Choice  HOME HEALTH   Choice offered to / List presented to:  [name]        [place] arranged  HH-1 RN  [place]      [place].   Status of service:  Completed, signed off Medicare Important Message given?   (If response is "NO", the following Medicare IM given date fields will be blank) Date Medicare IM given:   Medicare IM given by:   Date Additional Medicare IM given:   Additional Medicare IM given by:    Discharge Disposition:  [place]  Per UR Regulation:    If discussed at Long Length of Stay Meetings, dates discussed:    Comments:  02/14/2014 1500 NCM spoke to pt and offered choice for [facility]. Pt agreeable to [facility] for HH. States she has a personal care assistance with [employer] sponsored by [employer]. States she has hospital bed, RW and bedside commode. She was in SNF and did not like the facilities. They would let her stay wet for long periods of time and she developed skin breakdown. She reports needing additional hours for pca. Does not want to return to a SNF. She receives T[facility]ri at [facility]. States her MS is progressing. Jonnie Finner RN CCM Case Mgmt phone [phone]

## 2014-02-14 NOTE — Discharge Instructions (Signed)
Stasis Ulcer Stasis ulcers occur in the legs when the circulation is damaged. An ulcer may look like a small hole in the skin.  CAUSES Stasis ulcers occur because your veins do not work properly. Veins have valves that help the blood return to the heart. If these valves do not work right, blood flows backwards and backs up into the veins near the skin. This condition causes the veins to become larger because of increased pressure and may lead to a stasis ulcer. SYMPTOMS   Shallow (superficial) sore on the leg.  Clear drainage or weeping from the sore.  Leg pain or a feeling of heaviness. This may be worse at the end of the day.  Leg swelling.  Skin color changes. DIAGNOSIS  Your caregiver will make a diagnosis by examining your leg. Your caregiver may order tests such as an ultrasound or other studies to evaluate the blood flow of the leg. HOME CARE INSTRUCTIONS   Do not stand or sit in one position for long periods of time. Do not sit with your legs crossed. Rest with your legs raised during the day. If possible, it is best if you can elevate your legs above your heart for 30 minutes, 3 to 4 times a day.  Wear elastic stockings or support hose. Do not wear other tight encircling garments around legs, pelvis, or waist. This causes increased pressure in your veins. If your caregiver has applied compressive medicated wraps, use them as instructed.  Walk as much as possible to increase blood flow. If you are taking long rides in a car or plane, take a break to walk around every 2 hours. If not already on aspirin, take a baby aspirin before long trips unless you have medical reasons that prohibit this.  Raise the foot of your bed at night with 2-inch blocks if approved by your caregiver. This may not be desirable if you have heart failure or breathing problems.  If you get a cut in the skin over the vein and the vein bleeds, lie down with your leg raised and gently clean the area with a clean  cloth. Apply pressure on the cut until the bleeding stops. Then place a dressing on the cut. See your caregiver if it continues to bleed or needs stitches. Also, see your caregiver if you develop an infection.Signs of an infection include a fever, redness, increased pain, and drainage of pus.  If your caregiver has given you a follow-up appointment, it is very important to keep that appointment. Not keeping the appointment could result in a chronic or permanent injury, pain, and disability. If there is any problem keeping the appointment, call your caregiver for assistance. SEEK IMMEDIATE MEDICAL CARE IF:  The ulcer area starts to break down.  You have pain, redness, tenderness, pus, or hard swelling in your leg over a vein or near the ulcer.  Your leg pain is uncomfortable.  You develop an unexplained fever.  You develop chest pain or shortness of breath. Document Released: 03/13/2001 Document Revised: 09/10/2011 Document Reviewed: 10/08/2010 Exit[employer] Patient Information 2015 [employer]. This information is not intended to replace advice given to you by your health care provider. Make sure you discuss any questions you have with your health care provider.  

## 2014-02-14 NOTE — ED Notes (Signed)
Bed: WA10 Expected date: 02/14/14 Expected time: 12:56 PM Means of arrival: Ambulance Comments: Cellulitus left lower leg

## 2014-02-16 ENCOUNTER — Telehealth (HOSPITAL_BASED_OUTPATIENT_CLINIC_OR_DEPARTMENT_OTHER): Payer: Self-pay | Admitting: Emergency Medicine

## 2014-02-16 ENCOUNTER — Other Ambulatory Visit (HOSPITAL_COMMUNITY): Payer: Self-pay

## 2014-02-16 ENCOUNTER — Encounter: Payer: Self-pay | Admitting: *Deleted

## 2014-02-16 NOTE — Telephone Encounter (Signed)
Letter sent out today.

## 2014-02-16 NOTE — Telephone Encounter (Signed)
lmptcb x 4 to reach pt about lab results and to increase K+ to 40 meq daily, needs BMET 2 weeks. I will mail out letter today for ptcb to discuss labs.

## 2014-02-17 NOTE — Telephone Encounter (Signed)
pt notified about lab results and said she got my messages yesterday and she started her increase dose of K+ 40 meq yesterday. I scheduled pt for BMET 9/4, pt verbalized understanding to Plan of Care.

## 2014-02-19 ENCOUNTER — Observation Stay (HOSPITAL_COMMUNITY)
Admission: EM | Admit: 2014-02-19 | Discharge: 2014-02-22 | Disposition: A | Discharge status: Skilled Nursing Facility | Payer: PRIVATE HEALTH INSURANCE | Attending: Internal Medicine | Admitting: Internal Medicine

## 2014-02-19 ENCOUNTER — Encounter (HOSPITAL_COMMUNITY): Payer: Self-pay | Admitting: Emergency Medicine

## 2014-02-19 ENCOUNTER — Emergency Department (HOSPITAL_COMMUNITY): Payer: PRIVATE HEALTH INSURANCE

## 2014-02-19 DIAGNOSIS — R531 Weakness: Secondary | ICD-10-CM

## 2014-02-19 DIAGNOSIS — I5042 Chronic combined systolic (congestive) and diastolic (congestive) heart failure: Secondary | ICD-10-CM

## 2014-02-19 DIAGNOSIS — E669 Obesity, unspecified: Secondary | ICD-10-CM

## 2014-02-19 DIAGNOSIS — L97909 Non-pressure chronic ulcer of unspecified part of unspecified lower leg with unspecified severity: Secondary | ICD-10-CM | POA: Insufficient documentation

## 2014-02-19 DIAGNOSIS — I10 Essential (primary) hypertension: Secondary | ICD-10-CM | POA: Diagnosis not present

## 2014-02-19 DIAGNOSIS — I5032 Chronic diastolic (congestive) heart failure: Secondary | ICD-10-CM

## 2014-02-19 DIAGNOSIS — F329 Major depressive disorder, single episode, unspecified: Secondary | ICD-10-CM | POA: Diagnosis not present

## 2014-02-19 DIAGNOSIS — I509 Heart failure, unspecified: Secondary | ICD-10-CM | POA: Insufficient documentation

## 2014-02-19 DIAGNOSIS — I872 Venous insufficiency (chronic) (peripheral): Secondary | ICD-10-CM | POA: Diagnosis not present

## 2014-02-19 DIAGNOSIS — Z79899 Other long term (current) drug therapy: Secondary | ICD-10-CM | POA: Diagnosis not present

## 2014-02-19 DIAGNOSIS — R000 Tachycardia, unspecified: Secondary | ICD-10-CM | POA: Diagnosis present

## 2014-02-19 DIAGNOSIS — E860 Dehydration: Secondary | ICD-10-CM | POA: Insufficient documentation

## 2014-02-19 DIAGNOSIS — G35 Multiple sclerosis: Principal | ICD-10-CM | POA: Insufficient documentation

## 2014-02-19 DIAGNOSIS — Z880 Allergy status to penicillin: Secondary | ICD-10-CM | POA: Insufficient documentation

## 2014-02-19 DIAGNOSIS — H409 Unspecified glaucoma: Secondary | ICD-10-CM | POA: Insufficient documentation

## 2014-02-19 DIAGNOSIS — E43 Unspecified severe protein-calorie malnutrition: Secondary | ICD-10-CM

## 2014-02-19 DIAGNOSIS — Z87891 Personal history of nicotine dependence: Secondary | ICD-10-CM | POA: Insufficient documentation

## 2014-02-19 DIAGNOSIS — I5022 Chronic systolic (congestive) heart failure: Secondary | ICD-10-CM | POA: Diagnosis not present

## 2014-02-19 DIAGNOSIS — G8220 Paraplegia, unspecified: Secondary | ICD-10-CM | POA: Diagnosis not present

## 2014-02-19 DIAGNOSIS — L03113 Cellulitis of right upper limb: Secondary | ICD-10-CM

## 2014-02-19 DIAGNOSIS — Z882 Allergy status to sulfonamides status: Secondary | ICD-10-CM | POA: Insufficient documentation

## 2014-02-19 DIAGNOSIS — I110 Hypertensive heart disease with heart failure: Secondary | ICD-10-CM | POA: Diagnosis present

## 2014-02-19 DIAGNOSIS — F3289 Other specified depressive episodes: Secondary | ICD-10-CM | POA: Diagnosis not present

## 2014-02-19 DIAGNOSIS — R627 Adult failure to thrive: Secondary | ICD-10-CM

## 2014-02-19 DIAGNOSIS — Z7409 Other reduced mobility: Secondary | ICD-10-CM

## 2014-02-19 DIAGNOSIS — S81802D Unspecified open wound, left lower leg, subsequent encounter: Secondary | ICD-10-CM

## 2014-02-19 DIAGNOSIS — I5023 Acute on chronic systolic (congestive) heart failure: Secondary | ICD-10-CM

## 2014-02-19 DIAGNOSIS — N319 Neuromuscular dysfunction of bladder, unspecified: Secondary | ICD-10-CM

## 2014-02-19 DIAGNOSIS — I83009 Varicose veins of unspecified lower extremity with ulcer of unspecified site: Secondary | ICD-10-CM | POA: Diagnosis present

## 2014-02-19 DIAGNOSIS — I428 Other cardiomyopathies: Secondary | ICD-10-CM | POA: Diagnosis present

## 2014-02-19 LAB — COMPREHENSIVE METABOLIC PANEL
ALBUMIN: 3.2 g/dL — AB (ref 3.5–5.2)
ALK PHOS: 120 U/L — AB (ref 39–117)
ALT: 18 U/L (ref 0–35)
ANION GAP: 12 (ref 5–15)
AST: 28 U/L (ref 0–37)
BUN: 16 mg/dL (ref 6–23)
CHLORIDE: 108 meq/L (ref 96–112)
CO2: 23 meq/L (ref 19–32)
Calcium: 9.3 mg/dL (ref 8.4–10.5)
Creatinine, Ser: 0.88 mg/dL (ref 0.50–1.10)
GFR calc Af Amer: 87 mL/min — ABNORMAL LOW (ref >90)
GFR, EST NON AFRICAN AMERICAN: 75 mL/min — AB (ref >90)
Glucose, Bld: 121 mg/dL — ABNORMAL HIGH (ref 70–99)
POTASSIUM: 4.5 meq/L (ref 3.7–5.3)
Sodium: 143 mEq/L (ref 137–147)
Total Bilirubin: <0.2 mg/dL — ABNORMAL LOW (ref 0.3–1.2)
Total Protein: 6.9 g/dL (ref 6.0–8.3)

## 2014-02-19 LAB — CBC WITH DIFFERENTIAL/PLATELET
Basophils Absolute: 0.1 10*3/uL (ref 0.0–0.1)
Basophils Relative: 1 % (ref 0–1)
Eosinophils Absolute: 0.6 10*3/uL (ref 0.0–0.7)
Eosinophils Relative: 8 % — ABNORMAL HIGH (ref 0–5)
HEMATOCRIT: 29 % — AB (ref 36.0–46.0)
HEMOGLOBIN: 9.9 g/dL — AB (ref 12.0–15.0)
Lymphocytes Relative: 35 % (ref 12–46)
Lymphs Abs: 2.4 10*3/uL (ref 0.7–4.0)
MCH: 27.3 pg (ref 26.0–34.0)
MCHC: 34.1 g/dL (ref 30.0–36.0)
MCV: 80.1 fL (ref 78.0–100.0)
Monocytes Absolute: 0.6 10*3/uL (ref 0.1–1.0)
Monocytes Relative: 8 % (ref 3–12)
NEUTROS PCT: 48 % (ref 43–77)
Neutro Abs: 3.4 10*3/uL (ref 1.7–7.7)
Platelets: 299 10*3/uL (ref 150–400)
RBC: 3.62 MIL/uL — AB (ref 3.87–5.11)
RDW: 15.5 % (ref 11.5–15.5)
WBC: 7 10*3/uL (ref 4.0–10.5)

## 2014-02-19 LAB — I-STAT CG4 LACTIC ACID, ED: Lactic Acid, Venous: 0.76 mmol/L (ref 0.5–2.2)

## 2014-02-19 MED ORDER — SODIUM CHLORIDE 0.9 % IV BOLUS (SEPSIS)
500.0000 mL | Freq: Once | INTRAVENOUS | Status: AC
  Administered 2014-02-19: 500 mL via INTRAVENOUS

## 2014-02-19 MED ORDER — HYDROMORPHONE HCL PF 1 MG/ML IJ SOLN
1.0000 mg | Freq: Once | INTRAMUSCULAR | Status: AC
  Administered 2014-02-19: 1 mg via INTRAVENOUS
  Filled 2014-02-19: qty 1

## 2014-02-19 MED ORDER — ONDANSETRON HCL 4 MG/2ML IJ SOLN
4.0000 mg | Freq: Once | INTRAMUSCULAR | Status: AC
  Administered 2014-02-19: 4 mg via INTRAVENOUS
  Filled 2014-02-19: qty 2

## 2014-02-19 NOTE — ED Notes (Signed)
EKG given to EDP, Yao, MD. For review. 

## 2014-02-19 NOTE — ED Notes (Signed)
Pt is usually weak due to MS and she is weaker today after receiving Chemo treatment for MS.  Pt is wheelchair bound

## 2014-02-19 NOTE — ED Provider Notes (Signed)
CSN: [id number]     Arrival date & time 02/19/14  2129 History   First MD Initiated Contact with Patient 02/19/14 2213     Chief Complaint  Patient presents with  . Weakness     (Consider location/radiation/quality/duration/timing/severity/associated sxs/prior Treatment) HPI Comments: Patient with history of multiple sclerosis, CHF/non-ischemic cardiomyopathy -- presents with severe weakness after receiving chemotherapy for her MS today. Patient states that it is not unusual for her to feel weak however she is not able to do her baseline activities, transfer from her bed to toilet. She complains of baseline left-sided pain. She has had several falls over the past several days at home and has needed to call EMS several times to help her get up off the floor. She's been having urinary and fecal incontinence. Patient has a left lower extremity venous stasis ulcer for which she just finished a course of antibiotics. She is found to be tachycardic and tachypneic in emergency department. Patient complains of some wheezing. She has had a runny nose for several days but no sore throat. No chest pain or abdominal pain. No dysuria but patient has a history of urinary tract infection. No diarrhea. The onset of this condition was acute. The course is constant. Aggravating factors: none. Alleviating factors: none.    Patient is a 53 y.o. female presenting with weakness. The history is provided by the patient and medical records.  Weakness Associated symptoms include weakness. Pertinent negatives include no abdominal pain, chest pain, coughing, fever, headaches, myalgias, nausea, rash, sore throat or vomiting.    Past Medical History  Diagnosis Date  . MS (multiple sclerosis)     a. Dx'd late 20's. b. Tx with Novantrone, Tysabri, Copaxone previously.  Marland Kitchen Hypertension   . Depression   . Glaucoma   . Cardiomyopathy     a.  Echo 04/29/12: Mild LVH, EF 20-25%, mild AI, moderate MR, moderate LAE, mild RAE,  mild RVE, moderate TR, PASP 51, small pericardial effusion;   b. probably non-ischemic given multiple chemo-Tx agents used for MS and global LV dysfn on echo  . Chronic systolic heart failure   . CHF (congestive heart failure)   . Shortness of breath   . Neuromuscular disorder     MS   Past Surgical History  Procedure Laterality Date  . Ablation      uterine  . Cesarean section     Family History  Problem Relation Age of Onset  . Hypertension Mother   . Heart attack Neg Hx    History  Substance Use Topics  . Smoking status: Former Research scientist (life sciences)  . Smokeless tobacco: Never Used  . Alcohol Use: No   OB History   Grav Para Term Preterm Abortions TAB SAB Ect Mult Living                 Review of Systems  Constitutional: Negative for fever.  HENT: Negative for rhinorrhea and sore throat.   Eyes: Negative for redness.  Respiratory: Positive for shortness of breath. Negative for cough.   Cardiovascular: Negative for chest pain and leg swelling.  Gastrointestinal: Negative for nausea, vomiting, abdominal pain and diarrhea.  Genitourinary: Negative for dysuria.  Musculoskeletal: Negative for myalgias.  Skin: Positive for wound. Negative for rash.  Neurological: Positive for weakness. Negative for headaches.    Allergies  Sulfonamide derivatives and Penicillins  Home Medications   Prior to Admission medications   Medication Sig Start Date End Date Taking? Authorizing Provider  amantadine (SYMMETREL) 100 MG  capsule Take 100 mg by mouth 2 (two) times daily.    Yes Historical Provider, MD  carvedilol (COREG) 25 MG tablet Take 12.5 mg by mouth 2 (two) times daily with a meal.    Yes Historical Provider, MD  spironolactone (ALDACTONE) 25 MG tablet Take 25 mg by mouth daily.   Yes Historical Provider, MD  docusate sodium (COLACE) 100 MG capsule Take 100 mg by mouth 2 (two) times daily.    Historical Provider, MD  doxycycline (VIBRAMYCIN) 100 MG capsule Take 1 capsule (100 mg total) by  mouth 2 (two) times daily. 02/09/14   Liliane Shi, PA-C  furosemide (LASIX) 80 MG tablet Take 160 mg by mouth daily. Take 160 mg every morning and 80 mg po q HS    Historical Provider, MD  gabapentin (NEURONTIN) 600 MG tablet Take 600 mg by mouth 3 (three) times daily.     Historical Provider, MD  imipramine (TOFRANIL-PM) 100 MG capsule Take 100 mg by mouth at bedtime.    Historical Provider, MD  latanoprost (XALATAN) 0.005 % ophthalmic solution Place 1 drop into both eyes at bedtime.    Historical Provider, MD  Multiple Vitamin (MULTIVITAMIN WITH MINERALS) TABS tablet Take 1 tablet by mouth daily.    Historical Provider, MD  oxyCODONE-acetaminophen (PERCOCET) 5-325 MG per tablet Take 1 tablet by mouth every 6 (six) hours as needed for moderate pain. 10/14/13   Evelina Bucy, MD  oxyCODONE-acetaminophen (PERCOCET/ROXICET) 5-325 MG per tablet Take 1 tablet by mouth every 4 (four) hours as needed. 02/14/14   Kristen N Ward, DO  polyethylene glycol (MIRALAX / GLYCOLAX) packet Take 17 g by mouth daily.    Historical Provider, MD  potassium chloride SA (K-DUR,KLOR-CON) 20 MEQ tablet Take 2 tablets (40 mEq total) by mouth daily. 02/10/14   Liliane Shi, PA-C  solifenacin (VESICARE) 10 MG tablet Take by mouth 2 (two) times daily.    Historical Provider, MD  zolpidem (AMBIEN) 5 MG tablet Take one tablet by mouth at bedtime as needed for rest 12/07/13   Tiffany L Reed, DO   BP 145/94  Pulse 124  Temp(Src) 99.1 F (37.3 C) (Oral)  Resp 26  SpO2 100%  Physical Exam  Nursing note and vitals reviewed. Constitutional: She appears well-developed and well-nourished.  HENT:  Head: Normocephalic and atraumatic.  Right Ear: External ear normal.  Left Ear: External ear normal.  Nose: Nose normal.  Mouth/Throat: Oropharynx is clear and moist.  Eyes: Conjunctivae are normal. Pupils are equal, round, and reactive to light. Right eye exhibits no discharge. Left eye exhibits no discharge.  Neck: Normal range of  motion. Neck supple.  Cardiovascular: Normal heart sounds.  Tachycardia present.   No murmur heard. Pulmonary/Chest: Tachypnea noted. No respiratory distress. She has no decreased breath sounds. She has wheezes (slight scattered). She has no rhonchi. She has no rales.  Abdominal: Soft. There is no tenderness. There is no rebound and no guarding.  Musculoskeletal: She exhibits no edema and no tenderness.  Neurological: She is alert.  Skin: Skin is warm and dry. There is erythema.  See picture. Venous stasis ulceration with mild surrounding erythema.   Psychiatric: She has a normal mood and affect.        ED Course  Procedures (including critical care time) Labs Review Labs Reviewed  CBC WITH DIFFERENTIAL - Abnormal; Notable for the following:    RBC 3.62 (*)    Hemoglobin 9.9 (*)    HCT 29.0 (*)  Eosinophils Relative 8 (*)    All other components within normal limits  COMPREHENSIVE METABOLIC PANEL - Abnormal; Notable for the following:    Glucose, Bld 121 (*)    Albumin 3.2 (*)    Alkaline Phosphatase 120 (*)    Total Bilirubin <0.2 (*)    GFR calc non Af Amer 75 (*)    GFR calc Af Amer 87 (*)    All other components within normal limits  URINALYSIS, ROUTINE W REFLEX MICROSCOPIC - Abnormal; Notable for the following:    APPearance CLOUDY (*)    Leukocytes, UA TRACE (*)    All other components within normal limits  PRO B NATRIURETIC PEPTIDE - Abnormal; Notable for the following:    Pro B Natriuretic peptide (BNP) 275.9 (*)    All other components within normal limits  URINE MICROSCOPIC-ADD ON - Abnormal; Notable for the following:    Squamous Epithelial / LPF MANY (*)    All other components within normal limits  CK - Abnormal; Notable for the following:    Total CK 411 (*)    All other components within normal limits  CULTURE, BLOOD (ROUTINE X 2)  CULTURE, BLOOD (ROUTINE X 2)  URINE CULTURE  I-STAT CG4 LACTIC ACID, ED    Imaging Review Dg Chest 2  View  02/20/2014   CLINICAL DATA:  Weakness.  EXAM: CHEST  2 VIEW  COMPARISON:  10/14/2013  FINDINGS: Borderline cardiomegaly, with heart size accentuated by low lung volumes. Negative upper mediastinal contours.  Interstitial crowding from hypoaeration. There is no edema, consolidation, effusion, or pneumothorax.  IMPRESSION: No active cardiopulmonary disease.   Electronically Signed   By: Jorje Guild M.D.   On: 02/20/2014 01:04     EKG Interpretation   Date/Time:  Friday February 19 2014 23:14:15 EDT Ventricular Rate:  125 PR Interval:  103 QRS Duration: 88 QT Interval:  341 QTC Calculation: 492 R Axis:   22 Text Interpretation:  Sinus tachycardia Multiform ventricular premature  complexes Aberrant conduction of SV complex(es) Borderline T wave  abnormalities Borderline prolonged QT interval Since last tracing rate  faster Confirmed by YAO  MD, DAVID ([id number]) on 02/19/2014 11:16:37 PM      11:04 PM Patient seen and examined. Work-up initiated. Concern for sepsis. Will carefully hydrate. Discussed with Dr. Darl Householder who will see.   Vital signs reviewed and are as follows: BP 145/94  Pulse 124  Temp(Src) 99.1 F (37.3 C) (Oral)  Resp 26  SpO2 100%  1:21 AM Pt seen by Dr. Darl Householder who agrees with plan.   No great source of infection found on work-up. Will give IV clinda for leg wound. Will get chest CT to r/o PE. She is slightly less tachy (120) after 1L fluids. Her pain is well controlled now. Will need admission. PCP Dr. Fredderick Phenix.   BP 152/74  Pulse 118  Temp(Src) 99.1 F (37.3 C) (Oral)  Resp 20  SpO2 99%  1:38 AM Discussed with Dr. Roel Cluck who will admit.    MDM   Final diagnoses:  Generalized weakness  Tachycardia  Leg wound, left, subsequent encounter   Admit for above issues.     Carlisle Cater, PA-C 02/20/14 0140

## 2014-02-19 NOTE — ED Notes (Signed)
Bed: PF29 Expected date: 02/19/14 Expected time: 9:13 PM Means of arrival: Ambulance Comments: weakness

## 2014-02-19 NOTE — ED Notes (Signed)
Pt has to travel to [name] for chemo and she states that it is hard on her and painful,  Pt states 10/10 pain on left side all day long

## 2014-02-20 ENCOUNTER — Emergency Department (HOSPITAL_COMMUNITY): Payer: PRIVATE HEALTH INSURANCE

## 2014-02-20 ENCOUNTER — Encounter (HOSPITAL_COMMUNITY): Payer: Self-pay | Admitting: Internal Medicine

## 2014-02-20 DIAGNOSIS — R5383 Other fatigue: Secondary | ICD-10-CM

## 2014-02-20 DIAGNOSIS — E669 Obesity, unspecified: Secondary | ICD-10-CM

## 2014-02-20 DIAGNOSIS — I10 Essential (primary) hypertension: Secondary | ICD-10-CM

## 2014-02-20 DIAGNOSIS — R5381 Other malaise: Secondary | ICD-10-CM

## 2014-02-20 DIAGNOSIS — Z5189 Encounter for other specified aftercare: Secondary | ICD-10-CM

## 2014-02-20 DIAGNOSIS — E860 Dehydration: Secondary | ICD-10-CM

## 2014-02-20 DIAGNOSIS — R000 Tachycardia, unspecified: Secondary | ICD-10-CM | POA: Diagnosis present

## 2014-02-20 DIAGNOSIS — G35 Multiple sclerosis: Secondary | ICD-10-CM | POA: Diagnosis not present

## 2014-02-20 DIAGNOSIS — S81809A Unspecified open wound, unspecified lower leg, initial encounter: Secondary | ICD-10-CM

## 2014-02-20 DIAGNOSIS — I5023 Acute on chronic systolic (congestive) heart failure: Secondary | ICD-10-CM

## 2014-02-20 DIAGNOSIS — I5032 Chronic diastolic (congestive) heart failure: Secondary | ICD-10-CM

## 2014-02-20 DIAGNOSIS — L97909 Non-pressure chronic ulcer of unspecified part of unspecified lower leg with unspecified severity: Secondary | ICD-10-CM | POA: Diagnosis present

## 2014-02-20 DIAGNOSIS — N319 Neuromuscular dysfunction of bladder, unspecified: Secondary | ICD-10-CM

## 2014-02-20 DIAGNOSIS — E41 Nutritional marasmus: Secondary | ICD-10-CM

## 2014-02-20 DIAGNOSIS — R627 Adult failure to thrive: Secondary | ICD-10-CM

## 2014-02-20 DIAGNOSIS — I83009 Varicose veins of unspecified lower extremity with ulcer of unspecified site: Secondary | ICD-10-CM | POA: Diagnosis present

## 2014-02-20 LAB — COMPREHENSIVE METABOLIC PANEL
ALBUMIN: 3.1 g/dL — AB (ref 3.5–5.2)
ALT: 16 U/L (ref 0–35)
AST: 21 U/L (ref 0–37)
Alkaline Phosphatase: 115 U/L (ref 39–117)
Anion gap: 10 (ref 5–15)
BUN: 12 mg/dL (ref 6–23)
CALCIUM: 8.9 mg/dL (ref 8.4–10.5)
CO2: 24 mEq/L (ref 19–32)
Chloride: 109 mEq/L (ref 96–112)
Creatinine, Ser: 0.89 mg/dL (ref 0.50–1.10)
GFR calc non Af Amer: 74 mL/min — ABNORMAL LOW (ref >90)
GFR, EST AFRICAN AMERICAN: 86 mL/min — AB (ref >90)
GLUCOSE: 93 mg/dL (ref 70–99)
Potassium: 4.2 mEq/L (ref 3.7–5.3)
SODIUM: 143 meq/L (ref 137–147)
Total Bilirubin: 0.2 mg/dL — ABNORMAL LOW (ref 0.3–1.2)
Total Protein: 6.5 g/dL (ref 6.0–8.3)

## 2014-02-20 LAB — URINALYSIS, ROUTINE W REFLEX MICROSCOPIC
Bilirubin Urine: NEGATIVE
GLUCOSE, UA: NEGATIVE mg/dL
Hgb urine dipstick: NEGATIVE
KETONES UR: NEGATIVE mg/dL
Nitrite: NEGATIVE
PROTEIN: NEGATIVE mg/dL
Specific Gravity, Urine: 1.014 (ref 1.005–1.030)
Urobilinogen, UA: 0.2 mg/dL (ref 0.0–1.0)
pH: 7 (ref 5.0–8.0)

## 2014-02-20 LAB — PRO B NATRIURETIC PEPTIDE: Pro B Natriuretic peptide (BNP): 275.9 pg/mL — ABNORMAL HIGH (ref 0–125)

## 2014-02-20 LAB — CBC
HEMATOCRIT: 28.8 % — AB (ref 36.0–46.0)
HEMOGLOBIN: 9.9 g/dL — AB (ref 12.0–15.0)
MCH: 27.8 pg (ref 26.0–34.0)
MCHC: 34.4 g/dL (ref 30.0–36.0)
MCV: 80.9 fL (ref 78.0–100.0)
Platelets: 261 10*3/uL (ref 150–400)
RBC: 3.56 MIL/uL — AB (ref 3.87–5.11)
RDW: 15.4 % (ref 11.5–15.5)
WBC: 7.1 10*3/uL (ref 4.0–10.5)

## 2014-02-20 LAB — URINE MICROSCOPIC-ADD ON

## 2014-02-20 LAB — CK: Total CK: 411 U/L — ABNORMAL HIGH (ref 7–177)

## 2014-02-20 LAB — MAGNESIUM: Magnesium: 2 mg/dL (ref 1.5–2.5)

## 2014-02-20 LAB — TSH: TSH: 5.22 u[IU]/mL — ABNORMAL HIGH (ref 0.350–4.500)

## 2014-02-20 LAB — PHOSPHORUS: PHOSPHORUS: 3.5 mg/dL (ref 2.3–4.6)

## 2014-02-20 MED ORDER — IMIPRAMINE PAMOATE 100 MG PO CAPS
100.0000 mg | ORAL_CAPSULE | Freq: Every day | ORAL | Status: DC
Start: 2014-02-20 — End: 2014-02-20
  Filled 2014-02-20: qty 1

## 2014-02-20 MED ORDER — DOXYCYCLINE HYCLATE 100 MG PO TABS
100.0000 mg | ORAL_TABLET | Freq: Two times a day (BID) | ORAL | Status: DC
  Administered 2014-02-20 – 2014-02-22 (×5): 100 mg via ORAL
  Filled 2014-02-20 (×7): qty 1

## 2014-02-20 MED ORDER — POLYETHYLENE GLYCOL 3350 17 G PO PACK
17.0000 g | PACK | Freq: Every day | ORAL | Status: DC
  Administered 2014-02-20: 17 g via ORAL
  Filled 2014-02-20 (×3): qty 1

## 2014-02-20 MED ORDER — SODIUM CHLORIDE 0.9 % IV BOLUS (SEPSIS)
500.0000 mL | Freq: Once | INTRAVENOUS | Status: AC
  Administered 2014-02-20: 500 mL via INTRAVENOUS

## 2014-02-20 MED ORDER — DARIFENACIN HYDROBROMIDE ER 7.5 MG PO TB24
7.5000 mg | ORAL_TABLET | Freq: Every day | ORAL | Status: DC
Start: 2014-02-20 — End: 2014-02-22
  Administered 2014-02-20 – 2014-02-22 (×3): 7.5 mg via ORAL
  Filled 2014-02-20 (×3): qty 1

## 2014-02-20 MED ORDER — LATANOPROST 0.005 % OP SOLN
1.0000 [drp] | Freq: Every day | OPHTHALMIC | Status: DC
  Administered 2014-02-20 – 2014-02-21 (×2): 1 [drp] via OPHTHALMIC
  Filled 2014-02-20: qty 2.5

## 2014-02-20 MED ORDER — ENOXAPARIN SODIUM 40 MG/0.4ML ~~LOC~~ SOLN
40.0000 mg | SUBCUTANEOUS | Status: DC
  Administered 2014-02-20 – 2014-02-22 (×3): 40 mg via SUBCUTANEOUS
  Filled 2014-02-20 (×3): qty 0.4

## 2014-02-20 MED ORDER — CLINDAMYCIN PHOSPHATE 600 MG/50ML IV SOLN
600.0000 mg | Freq: Once | INTRAVENOUS | Status: AC
  Administered 2014-02-20: 600 mg via INTRAVENOUS
  Filled 2014-02-20: qty 50

## 2014-02-20 MED ORDER — CARVEDILOL 12.5 MG PO TABS
12.5000 mg | ORAL_TABLET | Freq: Two times a day (BID) | ORAL | Status: DC
  Administered 2014-02-20 – 2014-02-21 (×3): 12.5 mg via ORAL
  Filled 2014-02-20 (×5): qty 1

## 2014-02-20 MED ORDER — GABAPENTIN 300 MG PO CAPS
600.0000 mg | ORAL_CAPSULE | Freq: Three times a day (TID) | ORAL | Status: DC
  Administered 2014-02-20 – 2014-02-22 (×8): 600 mg via ORAL
  Filled 2014-02-20 (×9): qty 2

## 2014-02-20 MED ORDER — SPIRONOLACTONE 25 MG PO TABS
25.0000 mg | ORAL_TABLET | Freq: Every day | ORAL | Status: DC
  Administered 2014-02-20 – 2014-02-22 (×3): 25 mg via ORAL
  Filled 2014-02-20 (×3): qty 1

## 2014-02-20 MED ORDER — AMANTADINE HCL 100 MG PO CAPS
100.0000 mg | ORAL_CAPSULE | Freq: Two times a day (BID) | ORAL | Status: DC
  Administered 2014-02-20 – 2014-02-22 (×5): 100 mg via ORAL
  Filled 2014-02-20 (×6): qty 1

## 2014-02-20 MED ORDER — SODIUM CHLORIDE 0.9 % IJ SOLN
3.0000 mL | Freq: Two times a day (BID) | INTRAMUSCULAR | Status: DC
  Administered 2014-02-20 – 2014-02-22 (×4): 3 mL via INTRAVENOUS

## 2014-02-20 MED ORDER — SODIUM CHLORIDE 0.9 % IV SOLN
INTRAVENOUS | Status: DC
  Administered 2014-02-20: 05:00:00 via INTRAVENOUS

## 2014-02-20 MED ORDER — HYDROMORPHONE HCL PF 1 MG/ML IJ SOLN
1.0000 mg | INTRAMUSCULAR | Status: DC | PRN
  Administered 2014-02-20 – 2014-02-22 (×5): 1 mg via INTRAVENOUS
  Filled 2014-02-20 (×5): qty 1

## 2014-02-20 MED ORDER — ACETAMINOPHEN 650 MG RE SUPP: 650.0000 mg | Freq: Four times a day (QID) | RECTAL | Status: DC | PRN

## 2014-02-20 MED ORDER — ONDANSETRON HCL 4 MG/2ML IJ SOLN
4.0000 mg | Freq: Four times a day (QID) | INTRAMUSCULAR | Status: DC | PRN
Start: 2014-02-20 — End: 2014-02-22

## 2014-02-20 MED ORDER — IOHEXOL 350 MG/ML SOLN
100.0000 mL | Freq: Once | INTRAVENOUS | Status: AC | PRN
  Administered 2014-02-20: 100 mL via INTRAVENOUS

## 2014-02-20 MED ORDER — LEVALBUTEROL HCL 0.63 MG/3ML IN NEBU: 0.6300 mg | INHALATION_SOLUTION | Freq: Four times a day (QID) | RESPIRATORY_TRACT | Status: DC | PRN

## 2014-02-20 MED ORDER — OXYCODONE-ACETAMINOPHEN 5-325 MG PO TABS
1.0000 | ORAL_TABLET | ORAL | Status: DC | PRN
  Administered 2014-02-21: 1 via ORAL
  Filled 2014-02-20: qty 1

## 2014-02-20 MED ORDER — ONDANSETRON HCL 4 MG PO TABS: 4.0000 mg | ORAL_TABLET | Freq: Four times a day (QID) | ORAL | Status: DC | PRN

## 2014-02-20 MED ORDER — IMIPRAMINE HCL 50 MG PO TABS
100.0000 mg | ORAL_TABLET | Freq: Every day | ORAL | Status: DC
  Administered 2014-02-20 – 2014-02-21 (×2): 100 mg via ORAL
  Filled 2014-02-20 (×3): qty 2

## 2014-02-20 MED ORDER — ZOLPIDEM TARTRATE 5 MG PO TABS: 5.0000 mg | ORAL_TABLET | Freq: Every evening | ORAL | Status: DC | PRN

## 2014-02-20 MED ORDER — NYSTATIN 100000 UNIT/GM EX POWD
Freq: Three times a day (TID) | CUTANEOUS | Status: DC
  Administered 2014-02-20 – 2014-02-22 (×6): via TOPICAL
  Filled 2014-02-20: qty 15

## 2014-02-20 MED ORDER — DOCUSATE SODIUM 100 MG PO CAPS
100.0000 mg | ORAL_CAPSULE | Freq: Two times a day (BID) | ORAL | Status: DC
  Administered 2014-02-20 – 2014-02-22 (×5): 100 mg via ORAL
  Filled 2014-02-20 (×6): qty 1

## 2014-02-20 MED ORDER — ACETAMINOPHEN 325 MG PO TABS: 650.0000 mg | ORAL_TABLET | Freq: Four times a day (QID) | ORAL | Status: DC | PRN

## 2014-02-20 NOTE — Progress Notes (Signed)
Clinical Social Work Department CLINICAL SOCIAL WORK PLACEMENT NOTE 02/20/2014  Patient:  Shannon Garrett, Shannon Garrett  Account Number:  [id number] Admit date:  02/19/2014  Clinical Social Worker:  Renold Genta  Date/time:  02/20/2014 01:37 PM  Clinical Social Work is seeking post-discharge placement for this patient at the following level of care:   [facility]   (*CSW will update this form in Epic as items are completed)   02/20/2014  Patient/family provided with [facility] Department of Clinical Social Work's list of facilities offering this level of care within the geographic area requested by the patient (or if unable, by the patient's family).  02/20/2014  Patient/family informed of their freedom to choose among providers that offer the needed level of care, that participate in Medicare, Medicaid or managed care program needed by the patient, have an available bed and are willing to accept the patient.  02/20/2014  Patient/family informed of [facility]' ownership interest in [facility], as well as of the fact that they are under no obligation to receive care at this facility.  PASARR submitted to EDS on 02/20/2014 PASARR number received on 02/20/2014  FL2 transmitted to all facilities in geographic area requested by pt/family on  02/20/2014 FL2 transmitted to all facilities within larger geographic area on   Patient informed that his/her managed care company has contracts with or will negotiate with  certain facilities, including the following:     Patient/family informed of bed offers received:   Patient chooses bed at  Physician recommends and patient chooses bed at    Patient to be transferred to  on   Patient to be transferred to facility by  Patient and family notified of transfer on  Name of family member notified:    The following physician request were entered in Epic:   Additional Comments:   Raynaldo Opitz, [place] Social Worker cell #: [phone]

## 2014-02-20 NOTE — Progress Notes (Signed)
Patient seen earlier today by my colleague Dr. do to the. Patient seen and examined, and a base reviewed, she is known to me from previous hospital visits. Came in to the hospital because of failure to thrive and generalized weakness. Mentioned more than once in the chart, has history of noncompliance with treatment or home health service. Had prolonged discussion with her, wants to go home when she improves.  Shannon Garrett Pager: [phone] 02/20/2014, 12:15 PM

## 2014-02-20 NOTE — H&P (Signed)
PCP: Dr. Shona Simpson   Chief Complaint:  Generalized fatigue  HPI: Shannon Garrett is a 53 y.o. female   has a past medical history of MS (multiple sclerosis); Hypertension; Depression; Glaucoma; Cardiomyopathy; Chronic systolic heart failure; CHF (congestive heart failure); Shortness of breath; and Neuromuscular disorder.   Presented with  Patient had undergone her usual MS treatment with Teasabrin at [facility]. She have been weak and unwell after worse which is typical. Patient fell off the commode and could not get up for few hours. She has an Aid that come 3 hours a day who has found her on the floor. She has a hospital bed and a bedside commode and usually able to transfer. Patient states she gets worse skin care in SNF.  She presented to ER and was found to be tachycardic. Denies any cough or chest pain. She has been having some chronic leg swelling that is not helped by lasix. She has been having pain on left side thinks this is due to being uncomfortable while laying on the floor.   Hospitalist was called for admission for dehydration  Review of Systems:    Pertinent positives include: fatigue, frequent falls  Constitutional:  No weight loss, night sweats, Fevers, chills,  weight loss  HEENT:  No headaches, Difficulty swallowing,Tooth/dental problems,Sore throat,  No sneezing, itching, ear ache, nasal congestion, post nasal drip,  Cardio-vascular:  No chest pain, Orthopnea, PND, anasarca, dizziness, palpitations.no Bilateral lower extremity swelling  GI:  No heartburn, indigestion, abdominal pain, nausea, vomiting, diarrhea, change in bowel habits, loss of appetite, melena, blood in stool, hematemesis Resp:  no shortness of breath at rest. No dyspnea on exertion, No excess mucus, no productive cough, No non-productive cough, No coughing up of blood.No change in color of mucus.No wheezing. Skin:  no rash or lesions. No jaundice GU:  no dysuria, change in color of  urine, no urgency or frequency. No straining to urinate.  No flank pain.  Musculoskeletal:  No joint pain or no joint swelling. No decreased range of motion. No back pain.  Psych:  No change in mood or affect. No depression or anxiety. No memory loss.  Neuro: no localizing neurological complaints, no tingling, no weakness, no double vision, no gait abnormality, no slurred speech, no confusion  Otherwise ROS are negative except for above, 10 systems were reviewed  Past Medical History: Past Medical History  Diagnosis Date  . MS (multiple sclerosis)     a. Dx'd late 20's. b. Tx with Novantrone, Tysabri, Copaxone previously.  Marland Kitchen Hypertension   . Depression   . Glaucoma   . Cardiomyopathy     a.  Echo 04/29/12: Mild LVH, EF 20-25%, mild AI, moderate MR, moderate LAE, mild RAE, mild RVE, moderate TR, PASP 51, small pericardial effusion;   b. probably non-ischemic given multiple chemo-Tx agents used for MS and global LV dysfn on echo  . Chronic systolic heart failure   . CHF (congestive heart failure)   . Shortness of breath   . Neuromuscular disorder     MS   Past Surgical History  Procedure Laterality Date  . Ablation      uterine  . Cesarean section       Medications: Prior to Admission medications   Medication Sig Start Date End Date Taking? Authorizing Provider  amantadine (SYMMETREL) 100 MG capsule Take 100 mg by mouth 2 (two) times daily.    Yes Historical Provider, MD  carvedilol (COREG) 25 MG tablet Take 12.5 mg  by mouth 2 (two) times daily with a meal.    Yes Historical Provider, MD  spironolactone (ALDACTONE) 25 MG tablet Take 25 mg by mouth daily.   Yes Historical Provider, MD  docusate sodium (COLACE) 100 MG capsule Take 100 mg by mouth 2 (two) times daily.    Historical Provider, MD  furosemide (LASIX) 80 MG tablet Take 160 mg by mouth daily. Take 160 mg every morning and 80 mg po q HS    Historical Provider, MD  gabapentin (NEURONTIN) 600 MG tablet Take 600 mg by  mouth 3 (three) times daily.     Historical Provider, MD  imipramine (TOFRANIL-PM) 100 MG capsule Take 100 mg by mouth at bedtime.    Historical Provider, MD  latanoprost (XALATAN) 0.005 % ophthalmic solution Place 1 drop into both eyes at bedtime.    Historical Provider, MD  Multiple Vitamin (MULTIVITAMIN WITH MINERALS) TABS tablet Take 1 tablet by mouth daily.    Historical Provider, MD  oxyCODONE-acetaminophen (PERCOCET) 5-325 MG per tablet Take 1 tablet by mouth every 6 (six) hours as needed for moderate pain. 10/14/13   Evelina Bucy, MD  oxyCODONE-acetaminophen (PERCOCET/ROXICET) 5-325 MG per tablet Take 1 tablet by mouth every 4 (four) hours as needed. 02/14/14   Kristen N Ward, DO  polyethylene glycol (MIRALAX / GLYCOLAX) packet Take 17 g by mouth daily.    Historical Provider, MD  potassium chloride SA (K-DUR,KLOR-CON) 20 MEQ tablet Take 2 tablets (40 mEq total) by mouth daily. 02/10/14   Liliane Shi, PA-C  solifenacin (VESICARE) 10 MG tablet Take by mouth 2 (two) times daily.    Historical Provider, MD  zolpidem (AMBIEN) 5 MG tablet Take one tablet by mouth at bedtime as needed for rest 12/07/13   Gayland Curry, DO    Allergies:   Allergies  Allergen Reactions  . Sulfonamide Derivatives Hives and Shortness Of Breath  . Penicillins Hives and Itching    Social History:  Ambulatory  wheelchair bound,  Lives at home alone,      reports that she has quit smoking. She has never used smokeless tobacco. She reports that she does not drink alcohol or use illicit drugs.    Family History: family history includes Hypertension in her mother. There is no history of Heart attack.    Physical Exam: Patient Vitals for the past 24 hrs:  BP Temp Temp src Pulse Resp SpO2  02/20/14 0100 152/74 mmHg - - 118 20 99 %  02/20/14 0030 - - - 124 16 98 %  02/20/14 0000 155/101 mmHg - - 131 21 97 %  02/19/14 2345 146/78 mmHg - - - 19 -  02/19/14 2137 145/94 mmHg 99.1 F (37.3 C) Oral 124 26 100 %   02/19/14 2130 - - - - - 98 %    1. General: tearful 2. Psychological: Alert and  Oriented 3. Head/ENT:     Dry Mucous Membranes                          Head Non traumatic, neck supple                          Normal   Dentition 4. SKIN: decreased Skin turgor,  Skin clean Dry 6x7 cm shallow ulcer on left leg without drainage with slight redness at the border.  5. Heart: rapid Regular rate and rhythm no Murmur, Rub or gallop 6. Lungs:  no wheezes or crackles   7. Abdomen: Soft, non-tender, Non distended 8. Lower extremities: no clubbing, cyanosis, or edema 9. Neurologically diminished srtength in lower ext.  10. MSK: Normal range of motion  body mass index is unknown because there is no weight on file.   Labs on Admission:   Recent Labs  02/19/14 2312  NA 143  K 4.5  CL 108  CO2 23  GLUCOSE 121*  BUN 16  CREATININE 0.88  CALCIUM 9.3    Recent Labs  02/19/14 2312  AST 28  ALT 18  ALKPHOS 120*  BILITOT <0.2*  PROT 6.9  ALBUMIN 3.2*   No results found for this basename: LIPASE, AMYLASE,  in the last 72 hours  Recent Labs  02/19/14 2312  WBC 7.0  NEUTROABS 3.4  HGB 9.9*  HCT 29.0*  MCV 80.1  PLT 299    Recent Labs  02/19/14 2312  CKTOTAL 411*   No results found for this basename: TSH, T4TOTAL, FREET3, T3FREE, THYROIDAB,  in the last 72 hours No results found for this basename: VITAMINB12, FOLATE, FERRITIN, TIBC, IRON, RETICCTPCT,  in the last 72 hours No results found for this basename: HGBA1C    The CrCl is unknown because both a height and weight (above a minimum accepted value) are required for this calculation. ABG    Component Value Date/Time   TCO2 30 04/11/2008 1514     Lab Results  Component Value Date   DDIMER  Value: 0.61        AT THE INHOUSE ESTABLISHED CUTOFF VALUE OF 0.48 ug/mL FEU, THIS ASSAY HAS BEEN DOCUMENTED IN THE LITERATURE TO HAVE A SENSITIVITY AND NEGATIVE PREDICTIVE VALUE OF AT LEAST 98 TO 99%.  THE TEST RESULT SHOULD BE  CORRELATED WITH AN ASSESSMENT OF THE CLINICAL PROBABILITY OF DVT / VTE.* 05/19/2009     Other results:  I have pearsonaly reviewed this: ECG REPORT  Rate 125  Rhythm: ST ST&T Change: no ischemic changes   UA no evidence of UTI  BNP (last 3 results)  Recent Labs  10/14/13 1625 02/09/14 1630 02/19/14 2312  PROBNP 57.4 34.0 275.9*    There were no vitals filed for this visit.   Cultures:    Component Value Date/Time   SDES BLOOD RIGHT ARM 09/06/2012 0020   SPECREQUEST BOTTLES DRAWN AEROBIC AND ANAEROBIC 10CC EACH 09/06/2012 0020   CULT NO GROWTH 5 DAYS 09/06/2012 0020   REPTSTATUS 09/12/2012 FINAL 09/06/2012 0020   Radiological Exams on Admission: Dg Chest 2 View  02/20/2014   CLINICAL DATA:  Weakness.  EXAM: CHEST  2 VIEW  COMPARISON:  10/14/2013  FINDINGS: Borderline cardiomegaly, with heart size accentuated by low lung volumes. Negative upper mediastinal contours.  Interstitial crowding from hypoaeration. There is no edema, consolidation, effusion, or pneumothorax.  IMPRESSION: No active cardiopulmonary disease.   Electronically Signed   By: Jorje Guild M.D.   On: 02/20/2014 01:04    Chart has been reviewed  Assessment/Plan  53 yo F with hx of MS and debility here with mild dehydration after a fall  Present on Admission:  . Dehydration - for now hold  Lasix and give gentle IVF. Will need to restart to avoid fluid overload . MULTIPLE SCLEROSIS, PROGRESSIVE/RELAPSING - patient has undergone recent treatment, PT/OT eval for safe discharge . HYPERTENSION, BENIGN ESSENTIAL - cont home meds . Tachycardia- in the setting of mild dehdyartion, she have had tachycardia ithe past could be contributed by autonomic instability . Venous stasis ulcers -  mildly infected, doxycyline PO    Prophylaxis:  Lovenox, Protonix  CODE STATUS:  DNR/DNI as per patient wishes  Other plan as per orders.  I have spent a total of 55 min on this admission  Shannon Garrett 02/20/2014,  2:49 AM  [facility]  Pager [phone]   If 7AM-7PM, please contact the day team taking care of the patient  [email or web address]  Password [email or web address]

## 2014-02-20 NOTE — Evaluation (Addendum)
Physical Therapy Evaluation Patient Details Name: Shannon Garrett MRN: [id number] DOB: [birth year] Today's Date: 02/20/2014   History of Present Illness  53 y.o. female with h/o multiple sclerosis, HTN, CHF, glaucoma, depression who on 02/19/14  had undergone her usual MS treatment with Teasabrin at [facility]. She have been weak and unwell after worse which is typical. Patient fell off the commode and could not get up for few hours.   Clinical Impression  *Pt admitted with multiple sclerosis, fall, weakness*. Pt currently with functional limitations due to the deficits listed below (see PT Problem List).  Pt will benefit from skilled PT to increase their independence and safety with mobility to allow discharge to the venue listed below.   At present pt requires +2 assist for bed to recliner transfer with RW. She notes that she's been having increased difficulty at home getting up out of her scooter and feels more fatigued after trips to [place] for her MS treatments every 3 weeks. Marland Kitchen ST-SNF recommended.   **    Follow Up Recommendations SNF (pt wants to try [facility])    Equipment Recommendations  None recommended by PT    Recommendations for Other Services OT consult     Precautions / Restrictions Precautions Precautions: Fall Precaution Comments: h/o falls      Mobility  Bed Mobility Overal bed mobility: Needs Assistance Bed Mobility: Supine to Sit     Supine to sit: Min assist;HOB elevated     General bed mobility comments: HOB up 60*, assist to advance BLEs, pt pushed up on bedrail with BUEs  Transfers Overall transfer level: Needs assistance Equipment used: Rolling walker (2 wheeled) Transfers: Sit to/from [facility] Sit to Stand: +2 physical assistance;+2 safety/equipment;From elevated surface;Mod assist Stand pivot transfers: +2 physical assistance;Mod assist       General transfer comment: Sit to stand from elevated bed and from  recliner, +2 to rise, heavy use of BUEs on RW to maintain standing  Ambulation/Gait                Stairs            Wheelchair Mobility    Modified Rankin (Stroke Patients Only)       Balance Overall balance assessment: Needs assistance;History of Falls   Sitting balance-Leahy Scale: Fair     Standing balance support: Bilateral upper extremity supported Standing balance-Leahy Scale: Poor Standing balance comment: heavy use of BUEs on RW to maintain standing balance                             Pertinent Vitals/Pain Pain Assessment: 0-10 Pain Location: all over left side, pt didn't rate Pain Intervention(s): Monitored during session    Home Living Family/patient expects to be discharged to:: Unsure Living Arrangements: Alone Available Help at Discharge: Home health (Has aide 7 days/week for 3 hours) Type of Home: House Home Access: Ramped entrance     Home Layout: One level Home Equipment: Walker - 2 wheels;Electric scooter;Bedside commode;Tub bench;Hospital bed      Prior Function Level of Independence: Needs assistance   Gait / Transfers Assistance Needed: pt was independently transfering from bed to [facility] or to scooter, but that this is getting more difficult  ADL's / Homemaking Assistance Needed: aide comes daily  Comments: Pt stated her legs are getting weaker and she's having trouble getting out of her motorized scooter.      Hand Dominance  Dominant Hand: Right    Extremity/Trunk Assessment   Upper Extremity Assessment: Generalized weakness           Lower Extremity Assessment: Generalized weakness;RLE deficits/detail;LLE deficits/detail RLE Deficits / Details: knee extension -2/5, ankle DF 2/5, hip flexion/ABDuction 2/5 LLE Deficits / Details: knee extension +3/5, ankle -2/5, hip -2/5 (pt states L side is generally weaker than R)  Cervical / Trunk Assessment: Normal  Communication   Communication: No difficulties   Cognition Arousal/Alertness: Awake/alert Behavior During Therapy: WFL for tasks assessed/performed Overall Cognitive Status: Within Functional Limits for tasks assessed                      General Comments      Exercises        Assessment/Plan    PT Assessment Patient needs continued PT services  PT Diagnosis Generalized weakness;Difficulty walking   PT Problem List Decreased strength;Decreased activity tolerance;Decreased balance;Impaired sensation;Decreased mobility;Obesity;Pain  PT Treatment Interventions Functional mobility training;Therapeutic activities;Patient/family education;Balance training;Therapeutic exercise   PT Goals (Current goals can be found in the Care Plan section) Acute Rehab PT Goals Patient Stated Goal: to get back to independence with transfers PT Goal Formulation: With patient Time For Goal Achievement: 03/06/14 Potential to Achieve Goals: Good    Frequency Min 3X/week   Barriers to discharge Decreased caregiver support pt has aide 3*/day, at present she needs 24* assist    Co-evaluation               End of Session Equipment Utilized During Treatment: Gait belt Activity Tolerance: Patient limited by fatigue Patient left: in chair;with call bell/phone within reach Nurse Communication: Mobility status;Need for lift equipment         Time: [phone] PT Time Calculation (min): 41 min   Charges:   PT Evaluation $Initial PT Evaluation Tier I: 1 Procedure PT Treatments $Therapeutic Activity: 23-37 mins   PT G Codes:       PT G-Codes **NOT FOR INPATIENT CLASS** Functional Assessment Tool Used clinical judgement clinical judgement at 1057 on 02/20/14 by Lucile Crater, PT Functional Limitation Mobility: Walking and moving around Mobility: Walking and moving around at 1057 on 02/20/14 by Lucile Crater, PT Mobility: Walking and Moving Around Current Status [phone]) At least 60 percent but less than 80 percent  impaired, limited or restricted CL at 1057 on 02/20/14 by Lucile Crater, PT Mobility: Walking and Moving Around Goal Status [phone]) At least 20 percent but less than 40 percent impaired, limited or restricted CJ at 1057 on 02/20/14 by Lucile Crater, PT Mobility: Walking and Moving Around Discharge Status [phone])      Philomena Doheny 02/20/2014, 10:58 AM [phone]

## 2014-02-20 NOTE — ED Notes (Signed)
Assisted [place] NT with in and out cath,  Emptied bladder into bedpan then emptied bedpan and cleaned pt

## 2014-02-20 NOTE — Consult Note (Signed)
WOC wound consult note Reason for Consult: Open full thickness wound on left lateral LE, chronic, non-healing.  Present on Admission. Wound type:Venous insufficiency with pressure component.  Patient rotates laterally and wound makes continuous contact with the bed.  Area distal to current wound has recently healed. Pressure Ulcer POA: Yes Measurement:5.5cm x 5.0cm x 0.2cm with darkened tissue in center measuring 1cm x 0.5cm. Wound bed: Light pink, non-granulating, moist with darkened center tissue (measured above). Drainage (amount, consistency, odor) scant serous Periwound: intact, dry.  Distal lesion previously and recently healed/closed. Dressing procedure/placement/frequency: I will provide pressure redistribution boots to both elevate the heel and prevent pressure ulceration but to correct the lateral rotation and provide pressure redistribution to the LLE. Nutritional assessment by an RD and elevation of the extremity would be beneficial additions to the POC. [place] nursing team will not follow, but will remain available to this patient, the nursing and medical teams.  Please re-consult if needed. Thanks, Maudie Flakes, MSN, RN, [place], [place], [place] [phone])

## 2014-02-20 NOTE — Progress Notes (Signed)
Clinical Social Work Department BRIEF PSYCHOSOCIAL ASSESSMENT 02/20/2014  Patient:  Shannon Garrett, Shannon Garrett     Account Number:  [id number]     Admit date:  02/19/2014  Clinical Social Worker:  Renold Genta  Date/Time:  02/20/2014 01:31 PM  Referred by:  Physician  Date Referred:  02/20/2014 Referred for  SNF Placement   Other Referral:   Interview type:  Patient Other interview type:    PSYCHOSOCIAL DATA Living Status:  ALONE Admitted from facility:   Level of care:   Primary support name:  Shannon Garrett (sister) ph#: [phone] Primary support relationship to patient:  S[place] Degree of support available:   good    CURRENT CONCERNS Current Concerns  Post-Acute Placement   Other Concerns:    SOCIAL WORK ASSESSMENT / PLAN CSW receviewed PT evaluation recommending SNF for patient at discharge.   Assessment/plan status:  Information/Referral to [employer] Other assessment/ plan:   Information/referral to community resources:   CSW completed FL2 and faxed information out to [facility] - will follow-up with bed offers Monday.    PATIENT'S/FAMILY'S RESPONSE TO PLAN OF CARE: Patient informed CSW that she had recently been to [facility] for 3 weeks and discharged home. Patient requested [facility], CSW awaiting call back re: availability.       Raynaldo Opitz, [facility] Clinical Social Worker cell #: [phone]

## 2014-02-20 NOTE — Evaluation (Signed)
Occupational Therapy Evaluation Patient Details Name: Shannon Garrett MRN: [id number] DOB: Dec 14, 1960 Today's Date: 02/20/2014    History of Present Illness 53 y.o. female with h/o multiple sclerosis, HTN, CHF, glaucoma, depression who on 02/19/14  had undergone her usual MS treatment with [place] at [facility]. She have been weak and unwell after worse which is typical. Patient fell off the commode and could not get up for few hours.    Clinical Impression   Pt presents to OT with decreased I with ADL activity due to problems listed below. Pt will benefit from skilled OT to increase I with ADL activity and return to PLOF    Follow Up Recommendations  SNF    Equipment Recommendations  None recommended by OT       Precautions / Restrictions Precautions Precautions: Fall Precaution Comments: h/o falls      Mobility Bed Mobility Overal bed mobility: Needs Assistance Bed Mobility: Supine to Sit     Supine to sit: Min assist;HOB elevated        Transfers Overall transfer level: Needs assistance Equipment used: Rolling walker (2 wheeled)   Sit to Stand: +2 physical assistance;+2 safety/equipment;From elevated surface;Mod assist Stand pivot transfers: +2 physical assistance;Mod assist       General transfer comment: OT and RN used Steady to A pt back to bed as pt not standing upright enough to attempt a step.  Steady worked very well for this patient    Balance     Sitting balance-Leahy Scale: Fair       Standing balance-Leahy Scale: Poor                              ADL Overall ADL's : Needs assistance/impaired Eating/Feeding: Set up;Sitting   Grooming: Minimal assistance;Sitting   Upper Body Bathing: Moderate assistance;Sitting   Lower Body Bathing: Maximal assistance;Sit to/from stand;+2 for safety/equipment   Upper Body Dressing : Moderate assistance;Sitting   Lower Body Dressing: Sit to/from stand;Maximal assistance;+2 for  physical assistance                 General ADL Comments: pt had urinated in chair.  Pt very sleep stating she had a pain killer. RN confirmed.       Vision                            Pertinent Vitals/Pain Pain Assessment: No/denies pain     Hand Dominance Right   Extremity/Trunk Assessment Upper Extremity Assessment Upper Extremity Assessment: Generalized weakness (observed pt feed self with BUE)           Communication Communication Communication: No difficulties   Cognition Arousal/Alertness: Awake/alert Behavior During Therapy: WFL for tasks assessed/performed Overall Cognitive Status: Within Functional Limits for tasks assessed                     General Comments   OT used STEADY to get pt back to bed            Home Living Family/patient expects to be discharged to:: Unsure Living Arrangements: Alone Available Help at Discharge: Home health (Has aide 7 days/week for 3 hours) Type of Home: House Home Access: Ramped entrance     Home Layout: One level               Home Equipment: Walker - 2 wheels;Electric scooter;Bedside commode;Tub bench;Hospital bed  Prior Functioning/Environment Level of Independence: Needs assistance  Gait / Transfers Assistance Needed: pt was independently transfering from bed to [facility] or to scooter, but that this is getting more difficult ADL's / Homemaking Assistance Needed: aide comes daily   Comments: Pt stated her legs are getting weaker and she's having trouble getting out of her motorized scooter.     OT Diagnosis: Generalized weakness   OT Problem List: Decreased strength;Decreased activity tolerance;Impaired balance (sitting and/or standing)   OT Treatment/Interventions: Self-care/ADL training;DME and/or AE instruction;Patient/family education;Therapeutic activities    OT Goals(Current goals can be found in the care plan section) Acute Rehab OT Goals Patient Stated Goal: to get  back to independence with transfers OT Goal Formulation: With patient Time For Goal Achievement: 03/06/14 Potential to Achieve Goals: Good  OT Frequency: Min 2X/week    End of Session Nurse Communication: Mobility status  Activity Tolerance: Patient limited by fatigue Patient left: in bed;with call bell/phone within reach   Time: 1230-1300 OT Time Calculation (min): 30 min Charges:  OT General Charges $OT Visit: 1 Procedure OT Evaluation $Initial OT Evaluation Tier I: 1 Procedure OT Treatments $Self Care/Home Management : 8-22 mins G-Codes:    Payton Mccallum D 03/07/2014, 4:05 PM

## 2014-02-21 DIAGNOSIS — G35 Multiple sclerosis: Secondary | ICD-10-CM | POA: Diagnosis not present

## 2014-02-21 DIAGNOSIS — I5042 Chronic combined systolic (congestive) and diastolic (congestive) heart failure: Secondary | ICD-10-CM

## 2014-02-21 DIAGNOSIS — IMO0002 Reserved for concepts with insufficient information to code with codable children: Secondary | ICD-10-CM

## 2014-02-21 DIAGNOSIS — I509 Heart failure, unspecified: Secondary | ICD-10-CM

## 2014-02-21 LAB — URINE CULTURE
COLONY COUNT: NO GROWTH
Culture: NO GROWTH

## 2014-02-21 LAB — BASIC METABOLIC PANEL
Anion gap: 9 (ref 5–15)
BUN: 8 mg/dL (ref 6–23)
CHLORIDE: 109 meq/L (ref 96–112)
CO2: 26 meq/L (ref 19–32)
CREATININE: 0.93 mg/dL (ref 0.50–1.10)
Calcium: 9.1 mg/dL (ref 8.4–10.5)
GFR calc Af Amer: 81 mL/min — ABNORMAL LOW (ref >90)
GFR calc non Af Amer: 70 mL/min — ABNORMAL LOW (ref >90)
GLUCOSE: 86 mg/dL (ref 70–99)
Potassium: 3.8 mEq/L (ref 3.7–5.3)
Sodium: 144 mEq/L (ref 137–147)

## 2014-02-21 MED ORDER — FUROSEMIDE 40 MG PO TABS
40.0000 mg | ORAL_TABLET | Freq: Two times a day (BID) | ORAL | Status: DC
  Administered 2014-02-21 – 2014-02-22 (×3): 40 mg via ORAL
  Filled 2014-02-21 (×5): qty 1

## 2014-02-21 MED ORDER — CARVEDILOL 25 MG PO TABS
25.0000 mg | ORAL_TABLET | Freq: Two times a day (BID) | ORAL | Status: DC
  Administered 2014-02-21 – 2014-02-22 (×3): 25 mg via ORAL
  Filled 2014-02-21 (×5): qty 1

## 2014-02-21 MED ORDER — LEVOTHYROXINE SODIUM 50 MCG PO TABS
50.0000 ug | ORAL_TABLET | Freq: Every day | ORAL | Status: DC
  Administered 2014-02-22: 50 ug via ORAL
  Filled 2014-02-21 (×2): qty 1

## 2014-02-21 NOTE — Progress Notes (Signed)
[facility] PROGRESS NOTE   Ailene Royal TOI:[id number] DOB: 1961-05-16 DOA: 02/19/2014 PCP: Alvester Chou, NP  HPI/Subjective: Feels much better today, seen by physical therapy. Patient change her mind and wants to go to skilled nursing facility.  Assessment/Plan: Active Problems:   MULTIPLE SCLEROSIS, PROGRESSIVE/RELAPSING   HYPERTENSION, BENIGN ESSENTIAL   Other primary cardiomyopathies   Paraplegia   Tachycardia   Venous stasis ulcers   Dehydration    . Dehydration -Mild, Lasix was on hold and patient was given IV fluids for hydration. -Now IV fluids discontinued, restarted all her heart failure medications.   . MULTIPLE SCLEROSIS, PROGRESSIVE/RELAPSING -Patient has undergone recent treatment, PT/OT eval for safe discharge. -Patient reported that she always gets very weak after treatments.   Marland Kitchen HYPERTENSION, BENIGN ESSENTIAL  -Continue home meds   . Tachycardia -In the setting of mild dehdyartion, patient is on Corag. -I will increase her blocker dose.  . Venous stasis ulcers -Mildly infected, started on oral doxycycline. -Patient seen by wound care team.   Code Status: Full code Family Communication: Plan discussed with the patient. Disposition Plan: Remains inpatient   Consultants:  None  Procedures:  None  Antibiotics:  Oral doxycycline   Objective: Filed Vitals:   02/21/14 0851  BP: 142/66  Pulse: 119  Temp:   Resp:     Intake/Output Summary (Last 24 hours) at 02/21/14 1113 Last data filed at 02/21/14 1055  Gross per 24 hour  Intake    810 ml  Output      4 ml  Net    806 ml   Filed Weights   02/20/14 0422 02/21/14 0438  Weight: 99.5 kg (219 lb 5.7 oz) 97.5 kg (214 lb 15.2 oz)    Exam: General: Alert and awake, oriented x3, not in any acute distress. HEENT: anicteric sclera, pupils reactive to light and accommodation, EOMI CVS: S1-S2 clear, no murmur rubs or gallops Chest: clear to auscultation bilaterally, no wheezing,  rales or rhonchi Abdomen: soft nontender, nondistended, normal bowel sounds, no organomegaly Extremities: no cyanosis, clubbing or edema noted bilaterally Neuro: Cranial nerves II-XII intact, no focal neurological deficits  Data Reviewed: Basic Metabolic Panel:  Recent Labs Lab 02/19/14 2312 02/20/14 0826 02/21/14 0532  NA 143 143 144  K 4.5 4.2 3.8  CL 108 109 109  CO2 23 24 26   GLUCOSE 121* 93 86  BUN 16 12 8   CREATININE 0.88 0.89 0.93  CALCIUM 9.3 8.9 9.1  MG  --  2.0  --   PHOS  --  3.5  --    Liver Function Tests:  Recent Labs Lab 02/19/14 2312 02/20/14 0826  AST 28 21  ALT 18 16  ALKPHOS 120* 115  BILITOT <0.2* 0.2*  PROT 6.9 6.5  ALBUMIN 3.2* 3.1*   No results found for this basename: LIPASE, AMYLASE,  in the last 168 hours No results found for this basename: AMMONIA,  in the last 168 hours CBC:  Recent Labs Lab 02/19/14 2312 02/20/14 0826  WBC 7.0 7.1  NEUTROABS 3.4  --   HGB 9.9* 9.9*  HCT 29.0* 28.8*  MCV 80.1 80.9  PLT 299 261   Cardiac Enzymes:  Recent Labs Lab 02/19/14 2312  CKTOTAL 411*   BNP (last 3 results)  Recent Labs  10/14/13 1625 02/09/14 1630 02/19/14 2312  PROBNP 57.4 34.0 275.9*   CBG: No results found for this basename: GLUCAP,  in the last 168 hours  Micro Recent Results (from the past 240 hour(s))  URINE  CULTURE     Status: None   Collection Time    02/19/14 11:45 PM      Result Value Ref Range Status   Specimen Description URINE, CATHETERIZED   Final   Special Requests NONE   Final   Culture  Setup Time     Final   Value: 02/20/2014 03:41     Performed at [place]     Final   Value: NO GROWTH     Performed at [facility]   Culture     Final   Value: NO GROWTH     Performed at [facility]   Report Status 02/21/2014 FINAL   Final     Studies: Dg Chest 2 View  02/20/2014   CLINICAL DATA:  Weakness.  EXAM: CHEST  2 VIEW  COMPARISON:  10/14/2013  FINDINGS:  Borderline cardiomegaly, with heart size accentuated by low lung volumes. Negative upper mediastinal contours.  Interstitial crowding from hypoaeration. There is no edema, consolidation, effusion, or pneumothorax.  IMPRESSION: No active cardiopulmonary disease.   Electronically Signed   By: Jorje Guild M.D.   On: 02/20/2014 01:04   Ct Angio Chest Pe W/cm &/or Wo Cm  02/20/2014   CLINICAL DATA:  Persistent tachycardia  EXAM: CT ANGIOGRAPHY CHEST WITH CONTRAST  TECHNIQUE: Multidetector CT imaging of the chest was performed using the standard protocol during bolus administration of intravenous contrast. Multiplanar CT image reconstructions and MIPs were obtained to evaluate the vascular anatomy.  CONTRAST:  169mL OMNIPAQUE IOHEXOL 350 MG/ML SOLN  COMPARISON:  04/29/2012  FINDINGS: THORACIC INLET/BODY WALL:  No acute abnormality.  MEDIASTINUM:  Normal heart size. No pericardial effusion. Cardiac motion, bolus dispersion, and soft tissue attenuation decreases the density of the pulmonary arterial tree and decreases sensitivity. No evidence of pulmonary embolism to the segmental levels. No adenopathy.  LUNG WINDOWS:  No consolidation.  No effusion.  No suspicious pulmonary nodule.  UPPER ABDOMEN:  No acute findings.  OSSEOUS:  No acute fracture.  No suspicious lytic or blastic lesions.  Review of the MIP images confirms the above findings.  IMPRESSION: No evidence of pulmonary embolism.   Electronically Signed   By: Jorje Guild M.D.   On: 02/20/2014 02:55    Scheduled Meds: . amantadine  100 mg Oral BID  . carvedilol  12.5 mg Oral BID WC  . darifenacin  7.5 mg Oral Daily  . docusate sodium  100 mg Oral BID  . doxycycline  100 mg Oral Q12H  . enoxaparin (LOVENOX) injection  40 mg Subcutaneous Q24H  . gabapentin  600 mg Oral TID  . imipramine  100 mg Oral QHS  . latanoprost  1 drop Both Eyes QHS  . nystatin   Topical TID  . polyethylene glycol  17 g Oral Daily  . sodium chloride  3 mL Intravenous  Q12H  . spironolactone  25 mg Oral Daily   Continuous Infusions:      Time spent: 35 minutes    [facility] A  [facility] Pager [phone] If 7PM-7AM, please contact night-coverage at [email or web address], password [facility] 02/21/2014, 11:13 AM  LOS: 2 days

## 2014-02-22 DIAGNOSIS — G35 Multiple sclerosis: Secondary | ICD-10-CM | POA: Diagnosis not present

## 2014-02-22 DIAGNOSIS — R69 Illness, unspecified: Secondary | ICD-10-CM

## 2014-02-22 LAB — BASIC METABOLIC PANEL
ANION GAP: 11 (ref 5–15)
BUN: 10 mg/dL (ref 6–23)
CHLORIDE: 104 meq/L (ref 96–112)
CO2: 26 meq/L (ref 19–32)
Calcium: 8.8 mg/dL (ref 8.4–10.5)
Creatinine, Ser: 0.96 mg/dL (ref 0.50–1.10)
GFR calc non Af Amer: 67 mL/min — ABNORMAL LOW (ref >90)
GFR, EST AFRICAN AMERICAN: 78 mL/min — AB (ref >90)
Glucose, Bld: 84 mg/dL (ref 70–99)
Potassium: 3.6 mEq/L — ABNORMAL LOW (ref 3.7–5.3)
Sodium: 141 mEq/L (ref 137–147)

## 2014-02-22 MED ORDER — GABAPENTIN 600 MG PO TABS: 600.0000 mg | ORAL_TABLET | Freq: Three times a day (TID) | ORAL | Status: DC

## 2014-02-22 MED ORDER — OXYCODONE-ACETAMINOPHEN 5-325 MG PO TABS: 1.0000 | ORAL_TABLET | ORAL | Status: DC | PRN

## 2014-02-22 MED ORDER — POTASSIUM CHLORIDE CRYS ER 20 MEQ PO TBCR
40.0000 meq | EXTENDED_RELEASE_TABLET | Freq: Once | ORAL | Status: AC
  Administered 2014-02-22: 40 meq via ORAL
  Filled 2014-02-22: qty 2

## 2014-02-22 MED ORDER — DOXYCYCLINE HYCLATE 100 MG PO TABS: 100.0000 mg | ORAL_TABLET | Freq: Two times a day (BID) | ORAL | Status: DC

## 2014-02-22 MED ORDER — CARVEDILOL 25 MG PO TABS: 25.0000 mg | ORAL_TABLET | Freq: Two times a day (BID) | ORAL | Status: DC

## 2014-02-22 MED ORDER — FUROSEMIDE 80 MG PO TABS: 80.0000 mg | ORAL_TABLET | Freq: Two times a day (BID) | ORAL | Status: DC

## 2014-02-22 NOTE — Progress Notes (Signed)
Patient is set to discharge to [facility] today. Patient aware & requested that her family members not be notified "they can be too much sometimes." Discharge packet given to RN, Cyril Mourning. [facility] called for 2:30p pickup.   Clinical Social Work Department CLINICAL SOCIAL WORK PLACEMENT NOTE 02/22/2014  Patient:  Shannon Garrett, Shannon Garrett  Account Number:  [id number] Admit date:  02/19/2014  Clinical Social Worker:  Renold Genta  Date/time:  02/20/2014 01:37 PM  Clinical Social Work is seeking post-discharge placement for this patient at the following level of care:   [facility]   (*CSW will update this form in Epic as items are completed)   02/20/2014  Patient/family provided with [place] Department of Clinical Social Work's list of facilities offering this level of care within the geographic area requested by the patient (or if unable, by the patient's family).  02/20/2014  Patient/family informed of their freedom to choose among providers that offer the needed level of care, that participate in Medicare, Medicaid or managed care program needed by the patient, have an available bed and are willing to accept the patient.  02/20/2014  Patient/family informed of [facility]' ownership interest in [facility], as well as of the fact that they are under no obligation to receive care at this facility.  PASARR submitted to EDS on 02/20/2014 PASARR number received on 02/20/2014  FL2 transmitted to all facilities in geographic area requested by pt/family on  02/20/2014 FL2 transmitted to all facilities within larger geographic area on   Patient informed that his/her managed care company has contracts with or will negotiate with  certain facilities, including the following:     Patient/family informed of bed offers received:  02/22/2014 Patient chooses bed at [facility] [place] Physician recommends and patient chooses bed at    Patient to be  transferred to [facility] [place] on  02/22/2014 Patient to be transferred to facility by [facility] Patient and family notified of transfer on 02/22/2014 Name of family member notified:  patient requested that CSW not inform her family members  The following physician request were entered in Epic:   Additional Comments:   Raynaldo Opitz, [place] Social Worker cell #: [phone]

## 2014-02-22 NOTE — Discharge Summary (Addendum)
Physician Discharge Summary  Shannon Garrett GGY:[id number] DOB: Sep 13, 1960 DOA: 02/19/2014  PCP: Alvester Chou, NP  Admit date: 02/19/2014 Discharge date: 02/22/2014  Time spent: 40 minutes  Recommendations for Outpatient Follow-up:  1. Followup with primary care physician within one week.  Discharge Diagnoses:  Active Problems:   MULTIPLE SCLEROSIS, PROGRESSIVE/RELAPSING   HYPERTENSION, BENIGN ESSENTIAL   Other primary cardiomyopathies   Paraplegia   Tachycardia   Venous stasis ulcers   Dehydration   Discharge Condition: Stable  Diet recommendation: Heart Healthy  Filed Weights   02/20/14 0422 02/21/14 0438 02/22/14 0556  Weight: 99.5 kg (219 lb 5.7 oz) 97.5 kg (214 lb 15.2 oz) 96.8 kg (213 lb 6.5 oz)    History of present illness:  Shannon Garrett is a 53 y.o. female  has a past medical history of MS (multiple sclerosis); Hypertension; Depression; Glaucoma; Cardiomyopathy; Chronic systolic heart failure; CHF (congestive heart failure); Shortness of breath; and Neuromuscular disorder.  Presented with  Patient had undergone her usual MS treatment with [facility] at [facility]. She have been weak and unwell after worse which is typical. Patient fell off the commode and could not get up for few hours. She has an Aid that come 3 hours a day who has found her on the floor. She has a hospital bed and a bedside commode and usually able to transfer. Patient states she gets worse skin care in SNF. She presented to ER and was found to be tachycardic. Denies any cough or chest pain. She has been having some chronic leg swelling that is not helped by lasix. She has been having pain on left side thinks this is due to being uncomfortable while laying on the floor.  Hospitalist was called for admission for dehydration  Hospital Course:   . Dehydration  -Mild, Lasix was on hold and patient was given IV fluids for hydration.  -Now IV fluids discontinued, restarted all her heart failure  medications including Lasix and Coreg.  -This is mild, resolved, on discharge Lasix decreased from 160 a.m. and 80 at p.m. to 80 twice a day.   Generalized weakness -Patient reports gener severe generalized weakness, deconditioning every time she gets MS treatment. -Patient is bedbound, cannot walk. -PT/OT recommend a skilled nursing facility. Patient to be discharged today.  . MULTIPLE SCLEROSIS, PROGRESSIVE/RELAPSING  -Patient has undergone recent treatment, PT/OT eval for safe discharge.  -Patient reported that she always gets very weak after treatments.   Marland Kitchen HYPERTENSION, BENIGN ESSENTIAL  -Continue home meds   . Tachycardia  -In the setting of mild dehdyartion, patient is on Coreg.  -Her carvedilol increased to 25 mg twice a day, monitor heart rate and blood pressure closely.  . Venous stasis ulcers  -Mildly infected, started on oral doxycycline, continue for 7 more days on discharge.  -Patient seen by wound care team.    Chronic diastolic CHF -Her carvedilol increased to 25 mg twice a day. -Lasix decreased to 80 mg twice a day, patient is on Aldactone as well.  Procedures:  None  Consultations:  None  Discharge Exam: Filed Vitals:   02/22/14 0452  BP: 113/58  Pulse: 93  Temp: 98.6 F (37 C)  Resp: 18   General: Alert and awake, oriented x3, not in any acute distress. HEENT: anicteric sclera, pupils reactive to light and accommodation, EOMI CVS: S1-S2 clear, no murmur rubs or gallops Chest: clear to auscultation bilaterally, no wheezing, rales or rhonchi Abdomen: soft nontender, nondistended, normal bowel sounds, no organomegaly Extremities: no  cyanosis, left lower extremity venous stasis ulcer on the lateral aspect of the leg. Neuro: Cranial nerves II-XII intact, no focal neurological deficits  Discharge Instructions You were cared for by a hospitalist during your hospital stay. If you have any questions about your discharge medications or the care you  received while you were in the hospital after you are discharged, you can call the unit and asked to speak with the hospitalist on call if the hospitalist that took care of you is not available. Once you are discharged, your primary care physician will handle any further medical issues. Please note that NO REFILLS for any discharge medications will be authorized once you are discharged, as it is imperative that you return to your primary care physician (or establish a relationship with a primary care physician if you do not have one) for your aftercare needs so that they can reassess your need for medications and monitor your lab values.      Discharge Instructions   Diet - low sodium heart healthy    Complete by:  As directed      Increase activity slowly    Complete by:  As directed             Medication List         amantadine 100 MG capsule  Commonly known as:  SYMMETREL  Take 100 mg by mouth 2 (two) times daily.     carvedilol 25 MG tablet  Commonly known as:  COREG  Take 1 tablet (25 mg total) by mouth 2 (two) times daily with a meal.     docusate sodium 100 MG capsule  Commonly known as:  COLACE  Take 100 mg by mouth 2 (two) times daily.     doxycycline 100 MG tablet  Commonly known as:  VIBRA-TABS  Take 1 tablet (100 mg total) by mouth every 12 (twelve) hours.     furosemide 80 MG tablet  Commonly known as:  LASIX  Take 1 tablet (80 mg total) by mouth 2 (two) times daily. Take 160 mg every morning and 80 mg po q HS     gabapentin 600 MG tablet  Commonly known as:  NEURONTIN  Take 1 tablet (600 mg total) by mouth 3 (three) times daily.     imipramine 50 MG tablet  Commonly known as:  TOFRANIL  Take 50 mg by mouth 2 (two) times daily.     metroNIDAZOLE 0.75 % gel  Commonly known as:  METROGEL  Apply 1 application topically 2 (two) times daily.     multivitamin with minerals Tabs tablet  Take 1 tablet by mouth daily.     oxyCODONE-acetaminophen 5-325 MG per  tablet  Commonly known as:  PERCOCET/ROXICET  Take 1 tablet by mouth every 4 (four) hours as needed for moderate pain.     polyethylene glycol packet  Commonly known as:  MIRALAX / GLYCOLAX  Take 17 g by mouth daily.     potassium chloride SA 20 MEQ tablet  Commonly known as:  K-DUR,KLOR-CON  Take 2 tablets (40 mEq total) by mouth daily.     solifenacin 10 MG tablet  Commonly known as:  VESICARE  Take by mouth 2 (two) times daily.     spironolactone 25 MG tablet  Commonly known as:  ALDACTONE  Take 25 mg by mouth daily.     XALATAN 0.005 % ophthalmic solution  Generic drug:  latanoprost  Place 1 drop into both eyes at bedtime.  zolpidem 5 MG tablet  Commonly known as:  AMBIEN  Take 5 mg by mouth at bedtime as needed for sleep.       Allergies  Allergen Reactions  . Sulfonamide Derivatives Hives and Shortness Of Breath  . Penicillins Hives and Itching   Follow-up Information   Follow up with Alvester Chou, NP In 1 week.   Specialty:  Nurse Practitioner   Contact information:   [facility] [place] [address] [phone]        The results of significant diagnostics from this hospitalization (including imaging, microbiology, ancillary and laboratory) are listed below for reference.    Significant Diagnostic Studies: Dg Chest 2 View  02/20/2014   CLINICAL DATA:  Weakness.  EXAM: CHEST  2 VIEW  COMPARISON:  10/14/2013  FINDINGS: Borderline cardiomegaly, with heart size accentuated by low lung volumes. Negative upper mediastinal contours.  Interstitial crowding from hypoaeration. There is no edema, consolidation, effusion, or pneumothorax.  IMPRESSION: No active cardiopulmonary disease.   Electronically Signed   By: Jorje Guild M.D.   On: 02/20/2014 01:04   Ct Angio Chest Pe W/cm &/or Wo Cm  02/20/2014   CLINICAL DATA:  Persistent tachycardia  EXAM: CT ANGIOGRAPHY CHEST WITH CONTRAST  TECHNIQUE: Multidetector CT  imaging of the chest was performed using the standard protocol during bolus administration of intravenous contrast. Multiplanar CT image reconstructions and MIPs were obtained to evaluate the vascular anatomy.  CONTRAST:  168mL OMNIPAQUE IOHEXOL 350 MG/ML SOLN  COMPARISON:  04/29/2012  FINDINGS: THORACIC INLET/BODY WALL:  No acute abnormality.  MEDIASTINUM:  Normal heart size. No pericardial effusion. Cardiac motion, bolus dispersion, and soft tissue attenuation decreases the density of the pulmonary arterial tree and decreases sensitivity. No evidence of pulmonary embolism to the segmental levels. No adenopathy.  LUNG WINDOWS:  No consolidation.  No effusion.  No suspicious pulmonary nodule.  UPPER ABDOMEN:  No acute findings.  OSSEOUS:  No acute fracture.  No suspicious lytic or blastic lesions.  Review of the MIP images confirms the above findings.  IMPRESSION: No evidence of pulmonary embolism.   Electronically Signed   By: Jorje Guild M.D.   On: 02/20/2014 02:55    Microbiology: Recent Results (from the past 240 hour(s))  CULTURE, BLOOD (ROUTINE X 2)     Status: None   Collection Time    02/19/14 11:16 PM      Result Value Ref Range Status   Specimen Description BLOOD RIGHT HAND   Final   Special Requests BOTTLES DRAWN AEROBIC AND ANAEROBIC 5CC   Final   Culture  Setup Time     Final   Value: 02/20/2014 03:30     Performed at [facility]   Culture     Final   Value:        BLOOD CULTURE RECEIVED NO GROWTH TO DATE CULTURE WILL BE HELD FOR 5 DAYS BEFORE ISSUING A FINAL NEGATIVE REPORT     Performed at [facility]   Report Status PENDING   Incomplete  CULTURE, BLOOD (ROUTINE X 2)     Status: None   Collection Time    02/19/14 11:22 PM      Result Value Ref Range Status   Specimen Description BLOOD RIGHT HAND   Final   Special Requests BOTTLES DRAWN AEROBIC AND ANAEROBIC 6 CC EACH   Final   Culture  Setup Time     Final   Value: 02/20/2014 03:31  Performed at  [facility]     Final   Value:        BLOOD CULTURE RECEIVED NO GROWTH TO DATE CULTURE WILL BE HELD FOR 5 DAYS BEFORE ISSUING A FINAL NEGATIVE REPORT     Performed at [facility]   Report Status PENDING   Incomplete  URINE CULTURE     Status: None   Collection Time    02/19/14 11:45 PM      Result Value Ref Range Status   Specimen Description URINE, CATHETERIZED   Final   Special Requests NONE   Final   Culture  Setup Time     Final   Value: 02/20/2014 03:41     Performed at [place]     Final   Value: NO GROWTH     Performed at [facility]   Culture     Final   Value: NO GROWTH     Performed at [facility]   Report Status 02/21/2014 FINAL   Final     Labs: Basic Metabolic Panel:  Recent Labs Lab 02/19/14 2312 02/20/14 0826 02/21/14 0532 02/22/14 0436  NA 143 143 144 141  K 4.5 4.2 3.8 3.6*  CL 108 109 109 104  CO2 23 24 26 26   GLUCOSE 121* 93 86 84  BUN 16 12 8 10   CREATININE 0.88 0.89 0.93 0.96  CALCIUM 9.3 8.9 9.1 8.8  MG  --  2.0  --   --   PHOS  --  3.5  --   --    Liver Function Tests:  Recent Labs Lab 02/19/14 2312 02/20/14 0826  AST 28 21  ALT 18 16  ALKPHOS 120* 115  BILITOT <0.2* 0.2*  PROT 6.9 6.5  ALBUMIN 3.2* 3.1*   No results found for this basename: LIPASE, AMYLASE,  in the last 168 hours No results found for this basename: AMMONIA,  in the last 168 hours CBC:  Recent Labs Lab 02/19/14 2312 02/20/14 0826  WBC 7.0 7.1  NEUTROABS 3.4  --   HGB 9.9* 9.9*  HCT 29.0* 28.8*  MCV 80.1 80.9  PLT 299 261   Cardiac Enzymes:  Recent Labs Lab 02/19/14 2312  CKTOTAL 411*   BNP: BNP (last 3 results)  Recent Labs  10/14/13 1625 02/09/14 1630 02/19/14 2312  PROBNP 57.4 34.0 275.9*   CBG: No results found for this basename: GLUCAP,  in the last 168 hours     Signed:  Marchelle Rinella A  [facility] 02/22/2014, 11:35 AM

## 2014-02-22 NOTE — Progress Notes (Signed)
Report called to RN at [facility]. Hospital course and discharge reviewed. All questions answered. Callie Fielding RN

## 2014-02-22 NOTE — Progress Notes (Signed)
Have received report from Rubin Payor.  I agree with her assessment and will continue POC

## 2014-02-23 ENCOUNTER — Non-Acute Institutional Stay (SKILLED_NURSING_FACILITY): Payer: PRIVATE HEALTH INSURANCE | Admitting: Internal Medicine

## 2014-02-23 ENCOUNTER — Other Ambulatory Visit: Payer: Self-pay | Admitting: *Deleted

## 2014-02-23 ENCOUNTER — Encounter: Payer: Self-pay | Admitting: Internal Medicine

## 2014-02-23 DIAGNOSIS — R5383 Other fatigue: Secondary | ICD-10-CM

## 2014-02-23 DIAGNOSIS — L97909 Non-pressure chronic ulcer of unspecified part of unspecified lower leg with unspecified severity: Secondary | ICD-10-CM

## 2014-02-23 DIAGNOSIS — R5381 Other malaise: Secondary | ICD-10-CM

## 2014-02-23 DIAGNOSIS — R000 Tachycardia, unspecified: Secondary | ICD-10-CM

## 2014-02-23 DIAGNOSIS — I10 Essential (primary) hypertension: Secondary | ICD-10-CM

## 2014-02-23 DIAGNOSIS — R531 Weakness: Secondary | ICD-10-CM

## 2014-02-23 DIAGNOSIS — I5032 Chronic diastolic (congestive) heart failure: Secondary | ICD-10-CM

## 2014-02-23 DIAGNOSIS — I83009 Varicose veins of unspecified lower extremity with ulcer of unspecified site: Secondary | ICD-10-CM

## 2014-02-23 DIAGNOSIS — IMO0001 Reserved for inherently not codable concepts without codable children: Secondary | ICD-10-CM

## 2014-02-23 DIAGNOSIS — E860 Dehydration: Secondary | ICD-10-CM

## 2014-02-23 DIAGNOSIS — G35 Multiple sclerosis: Secondary | ICD-10-CM

## 2014-02-23 MED ORDER — ZOLPIDEM TARTRATE 5 MG PO TABS: 5.0000 mg | ORAL_TABLET | Freq: Every evening | ORAL | Status: DC | PRN

## 2014-02-23 MED ORDER — OXYCODONE-ACETAMINOPHEN 5-325 MG PO TABS: 1.0000 | ORAL_TABLET | ORAL | Status: DC | PRN

## 2014-02-23 NOTE — Assessment & Plan Note (Signed)
-  In the setting of mild dehdyartion, patient is on Coreg.  -Her carvedilol increased to 25 mg twice a day, monitor heart rate and blood pressure closely

## 2014-02-23 NOTE — Assessment & Plan Note (Signed)
Continue coreg, Lasix,spironolactone

## 2014-02-23 NOTE — Assessment & Plan Note (Signed)
Patient reports gener severe generalized weakness, deconditioning every time she gets MS treatment.  -Patient is bedbound, cannot walk.  -PT/OT recommend a skilled nursing facility. Patient to be discharged today

## 2014-02-23 NOTE — Progress Notes (Signed)
MRN: [id number] Name: Shannon Garrett  Sex: female Age: 54 y.o. DOB: [birth year]  [place] #: [facility] Facility/Room: 121 Level Of Care: SNF Provider: Inocencio Homes D Emergency Contacts: Extended Emergency Contact Information Primary Emergency Contact: [place], [place] [address] [place] Phone: [phone] Relation: Sister Secondary Emergency Contact: Jefm Bryant, [place] Phone: [phone] Relation: Friend  Code Status: DNR  Allergies: Sulfonamide derivatives and Penicillins  Chief Complaint  Patient presents with  . nursing home admission    HPI: Patient is 53 y.o. female who is admitted for exacerbation of MS for OT/PT for generalized weakness.  Past Medical History  Diagnosis Date  . MS (multiple sclerosis)     a. Dx'd late 20's. b. Tx with Novantrone, Tysabri, Copaxone previously.  Marland Kitchen Hypertension   . Depression   . Glaucoma   . Cardiomyopathy     a.  Echo 04/29/12: Mild LVH, EF 20-25%, mild AI, moderate MR, moderate LAE, mild RAE, mild RVE, moderate TR, PASP 51, small pericardial effusion;   b. probably non-ischemic given multiple chemo-Tx agents used for MS and global LV dysfn on echo  . Chronic systolic heart failure   . CHF (congestive heart failure)   . Shortness of breath   . Neuromuscular disorder     MS    Past Surgical History  Procedure Laterality Date  . Ablation      uterine  . Cesarean section        Medication List       This list is accurate as of: 02/23/14 10:26 PM.  Always use your most recent med list.               amantadine 100 MG capsule  Commonly known as:  SYMMETREL  Take 100 mg by mouth 2 (two) times daily.     carvedilol 25 MG tablet  Commonly known as:  COREG  Take 1 tablet (25 mg total) by mouth 2 (two) times daily with a meal.     docusate sodium 100 MG capsule  Commonly known as:  COLACE  Take 100 mg by mouth 2 (two) times daily.      doxycycline 100 MG tablet  Commonly known as:  VIBRA-TABS  Take 1 tablet (100 mg total) by mouth every 12 (twelve) hours.     furosemide 80 MG tablet  Commonly known as:  LASIX  Take 1 tablet (80 mg total) by mouth 2 (two) times daily. Take 160 mg every morning and 80 mg po q HS     gabapentin 600 MG tablet  Commonly known as:  NEURONTIN  Take 1 tablet (600 mg total) by mouth 3 (three) times daily.     imipramine 50 MG tablet  Commonly known as:  TOFRANIL  Take 50 mg by mouth 2 (two) times daily.     metroNIDAZOLE 0.75 % gel  Commonly known as:  METROGEL  Apply 1 application topically 2 (two) times daily.     multivitamin with minerals Tabs tablet  Take 1 tablet by mouth daily.     oxyCODONE-acetaminophen 5-325 MG per tablet  Commonly known as:  PERCOCET/ROXICET  Take 1 tablet by mouth every 4 (four) hours as needed for moderate pain.     polyethylene glycol packet  Commonly known as:  MIRALAX / GLYCOLAX  Take 17 g by mouth daily.  potassium chloride SA 20 MEQ tablet  Commonly known as:  K-DUR,KLOR-CON  Take 2 tablets (40 mEq total) by mouth daily.     solifenacin 10 MG tablet  Commonly known as:  VESICARE  Take by mouth 2 (two) times daily.     spironolactone 25 MG tablet  Commonly known as:  ALDACTONE  Take 25 mg by mouth daily.     XALATAN 0.005 % ophthalmic solution  Generic drug:  latanoprost  Place 1 drop into both eyes at bedtime.     zolpidem 5 MG tablet  Commonly known as:  AMBIEN  Take 1 tablet (5 mg total) by mouth at bedtime as needed for sleep.        No orders of the defined types were placed in this encounter.    Immunization History  Administered Date(s) Administered  . Influenza,inj,Quad PF,36+ Mos 08/01/2013  . Pneumococcal Polysaccharide-23 08/01/2013    History  Substance Use Topics  . Smoking status: Former Research scientist (life sciences)  . Smokeless tobacco: Never Used  . Alcohol Use: No    Family history is noncontributory    Review of  Systems  DATA OBTAINED: from patient; c/o poor treatment by tech GENERAL: Feels well no fevers, fatigue, appetite changes SKIN: No itching, rash or wounds EYES: No eye pain, redness, discharge EARS: No earache, tinnitus, change in hearing NOSE: No congestion, drainage or bleeding  MOUTH/THROAT: No mouth or tooth pain, No sore throat  RESPIRATORY: No cough, wheezing, SOB CARDIAC: No chest pain, palpitations, lower extremity edema  GI: No abdominal pain, No N/V/D or constipation, No heartburn or reflux  GU: No dysuria, frequency or urgency, or incontinence  MUSCULOSKELETAL: No unrelieved bone/joint pain NEUROLOGIC: No headache, dizziness  PSYCHIATRIC: No overt anxiety or sadness. Sleeps well. No behavior issue.   Filed Vitals:   02/23/14 1738  BP: 114/78  Pulse: 72  Temp: 96 F (35.6 C)  Resp: 18    Physical Exam  GENERAL APPEARANCE: Alert, modconversant. Appropriately groomed. No acute distress.  SKIN: No diaphoresis ; dressings LLE HEAD: Normocephalic, atraumatic  EYES: Conjunctiva/lids clear. Pupils round, reactive. EOMs intact.  EARS: External exam WNL MOUTH/THROAT: Lips w/o lesions.  RESPIRATORY: Breathing is even, unlabored. Lung sounds are clear   CARDIOVASCULAR: Heart RRR no murmurs, rubs or gallops. No peripheral edema.  GASTROINTESTINAL: Abdomen is soft, non-tender, not distended w/ normal bowel sounds GENITOURINARY: Bladder non tender, not distended  MUSCULOSKELETAL: contractures NEUROLOGIC: Oriented X3; difficulty with moving all extremities, bed and chair bound PSYCHIATRIC: Pt is upset today 2/2 aid being so unkind she has very tender feelings, no behavioral issues  Patient Active Problem List   Diagnosis Date Noted  . Tachycardia 02/20/2014  . Venous stasis ulcers 02/20/2014  . Dehydration 02/20/2014  . Leg wound, left 11/04/2013  . FTT (failure to thrive) in adult 07/31/2013  . Medically noncompliant 07/31/2013  . Failure to thrive 07/31/2013  .  Blisters of multiple sites 07/31/2013  . Generalized weakness 06/03/2013  . Insomnia 06/03/2013  . Neurogenic bladder 06/03/2013  . Rhabdomyolysis 05/25/2013  . Chronic diastolic heart failure 29/51/8841  . Elevated CK 09/10/2012  . Cellulitis of right upper extremity 09/10/2012  . Severe malnutrition 09/05/2012  . Paraplegia 09/05/2012  . Fall 09/04/2012  . Fever, unspecified 09/04/2012  . Chronic combined systolic and diastolic congestive heart failure 05/22/2012  . Acute on chronic systolic heart failure 66/12/3014  . Other primary cardiomyopathies 05/01/2012  . Acute respiratory failure 04/28/2012  . CHF exacerbation 04/28/2012  . Immobility 04/28/2012  .  OBESITY 11/30/2008  . ANEMIA, CHRONIC 11/30/2008  . MULTIPLE SCLEROSIS, PROGRESSIVE/RELAPSING 11/30/2008  . HYPERTENSION, BENIGN ESSENTIAL 11/30/2008  . ECZEMA 11/30/2008    CBC    Component Value Date/Time   WBC 7.1 02/20/2014 0826   RBC 3.56* 02/20/2014 0826   HGB 9.9* 02/20/2014 0826   HCT 28.8* 02/20/2014 0826   PLT 261 02/20/2014 0826   MCV 80.9 02/20/2014 0826   LYMPHSABS 2.4 02/19/2014 2312   MONOABS 0.6 02/19/2014 2312   EOSABS 0.6 02/19/2014 2312   BASOSABS 0.1 02/19/2014 2312    CMP     Component Value Date/Time   NA 141 02/22/2014 0436   K 3.6* 02/22/2014 0436   CL 104 02/22/2014 0436   CO2 26 02/22/2014 0436   GLUCOSE 84 02/22/2014 0436   BUN 10 02/22/2014 0436   CREATININE 0.96 02/22/2014 0436   CALCIUM 8.8 02/22/2014 0436   PROT 6.5 02/20/2014 0826   ALBUMIN 3.1* 02/20/2014 0826   AST 21 02/20/2014 0826   ALT 16 02/20/2014 0826   ALKPHOS 115 02/20/2014 0826   BILITOT 0.2* 02/20/2014 0826   GFRNONAA 67* 02/22/2014 0436   GFRAA 78* 02/22/2014 0436    Assessment and Plan  MULTIPLE SCLEROSIS, PROGRESSIVE/RELAPSING Patient had undergone her usual MS treatment with Teasabrin at [facility]. She have been weak and unwell after worse which is typical. Patient fell off the commode and could not get up for  few hours.PT/OT eval for safe discharge     Dehydration -This is mild, resolved, on discharge Lasix decreased from 160 a.m. and 80 at p.m. to 80 twice a day   Generalized weakness Patient reports gener severe generalized weakness, deconditioning every time she gets MS treatment.  -Patient is bedbound, cannot walk.  -PT/OT recommend a skilled nursing facility. Patient to be discharged today   HYPERTENSION, BENIGN ESSENTIAL Continue coreg, Lasix,spironolactone  Tachycardia -In the setting of mild dehdyartion, patient is on Coreg.  -Her carvedilol increased to 25 mg twice a day, monitor heart rate and blood pressure closely   Venous stasis ulcers Mildly infected, started on oral doxycycline, continue for 7 more days on discharge   Chronic diastolic heart failure Her carvedilol increased to 25 mg twice a day.  -Lasix decreased to 80 mg twice a day, patient is on Aldactone as well     Hennie Duos, MD

## 2014-02-23 NOTE — Telephone Encounter (Signed)
[facility] 

## 2014-02-23 NOTE — Assessment & Plan Note (Signed)
Patient had undergone her usual MS treatment with Teasabrin at [facility]. She have been weak and unwell after worse which is typical. Patient fell off the commode and could not get up for few hours.PT/OT eval for safe discharge

## 2014-02-23 NOTE — Assessment & Plan Note (Signed)
Her carvedilol increased to 25 mg twice a day.  -Lasix decreased to 80 mg twice a day, patient is on Aldactone as well

## 2014-02-23 NOTE — Assessment & Plan Note (Signed)
-  This is mild, resolved, on discharge Lasix decreased from 160 a.m. and 80 at p.m. to 80 twice a day

## 2014-02-23 NOTE — Assessment & Plan Note (Signed)
Mildly infected, started on oral doxycycline, continue for 7 more days on discharge

## 2014-02-24 ENCOUNTER — Encounter (HOSPITAL_BASED_OUTPATIENT_CLINIC_OR_DEPARTMENT_OTHER): Payer: PRIVATE HEALTH INSURANCE

## 2014-02-25 ENCOUNTER — Other Ambulatory Visit (HOSPITAL_COMMUNITY): Payer: Self-pay

## 2014-02-25 NOTE — ED Provider Notes (Signed)
Medical screening examination/treatment/procedure(s) were conducted as a shared visit with non-physician practitioner(s) and myself.  I personally evaluated the patient during the encounter.   EKG Interpretation   Date/Time:  Friday February 19 2014 23:14:15 EDT Ventricular Rate:  125 PR Interval:  103 QRS Duration: 88 QT Interval:  341 QTC Calculation: 492 R Axis:   22 Text Interpretation:  Sinus tachycardia Multiform ventricular premature  complexes Aberrant conduction of SV complex(es) Borderline T wave  abnormalities Borderline prolonged QT interval Since last tracing rate  faster Confirmed by Jaris Kohles  MD, [place]ot [place]ie ([id number]) on 02/19/2014 11:16:37 PM      Shannon Garrett is a 53 y.o. female hx of multiple sclerosis, CHF here with weakness. More weak than usual. Received chemo yesterday for MS. Has chronic venous ulcers. On exam, tachycardic. Tachypneic, + diffuse wheezing. Abdomen soft and nontender. Some venous ulcers, ? Cellulitis. Sepsis workup initiated. Labs showed nl WBC. However, still tachy 120s even after 1L NS. CT angio showed no PE. Given clinda for cellulitis. Will admit.    [name], MD 02/25/14 [phone]

## 2014-02-26 LAB — CULTURE, BLOOD (ROUTINE X 2)
CULTURE: NO GROWTH
Culture: NO GROWTH

## 2014-03-02 ENCOUNTER — Non-Acute Institutional Stay (SKILLED_NURSING_FACILITY): Payer: PRIVATE HEALTH INSURANCE | Admitting: Internal Medicine

## 2014-03-02 ENCOUNTER — Encounter: Payer: Self-pay | Admitting: Internal Medicine

## 2014-03-02 DIAGNOSIS — G35D Multiple sclerosis, unspecified: Secondary | ICD-10-CM

## 2014-03-02 DIAGNOSIS — G35 Multiple sclerosis: Secondary | ICD-10-CM

## 2014-03-02 DIAGNOSIS — D539 Nutritional anemia, unspecified: Secondary | ICD-10-CM

## 2014-03-02 DIAGNOSIS — I5032 Chronic diastolic (congestive) heart failure: Secondary | ICD-10-CM

## 2014-03-02 DIAGNOSIS — R5381 Other malaise: Secondary | ICD-10-CM

## 2014-03-02 DIAGNOSIS — Z91199 Patient's noncompliance with other medical treatment and regimen due to unspecified reason: Secondary | ICD-10-CM

## 2014-03-02 DIAGNOSIS — Z9119 Patient's noncompliance with other medical treatment and regimen: Secondary | ICD-10-CM

## 2014-03-02 DIAGNOSIS — I10 Essential (primary) hypertension: Secondary | ICD-10-CM

## 2014-03-02 DIAGNOSIS — R000 Tachycardia, unspecified: Secondary | ICD-10-CM

## 2014-03-02 DIAGNOSIS — R531 Weakness: Secondary | ICD-10-CM

## 2014-03-02 DIAGNOSIS — R5383 Other fatigue: Secondary | ICD-10-CM

## 2014-03-02 NOTE — Progress Notes (Addendum)
MRN: [id number] Name: Shannon Garrett  Sex: female Age: 53 y.o. DOB: [birth year]  [place] #: [facility] Facility/Room: 131B Level Of Care: SNF Provider: Inocencio Homes D Emergency Contacts: Extended Emergency Contact Information Primary Emergency Contact: [place], [place] [address] [place] Phone: [phone] Relation: Sister Secondary Emergency Contact: Jefm Bryant, [place] Phone: [phone] Relation: Friend  Code Status: DNR  Allergies: Sulfonamide derivatives and Penicillins  Chief Complaint  Patient presents with  . Discharge Note    HPI: Patient is 53 y.o. female who was admitted to SNF s/p acute exacerbation of MS who now wants to go home.  Past Medical History  Diagnosis Date  . MS (multiple sclerosis)     a. Dx'd late 20's. b. Tx with Novantrone, Tysabri, Copaxone previously.  Marland Kitchen Hypertension   . Depression   . Glaucoma   . Cardiomyopathy     a.  Echo 04/29/12: Mild LVH, EF 20-25%, mild AI, moderate MR, moderate LAE, mild RAE, mild RVE, moderate TR, PASP 51, small pericardial effusion;   b. probably non-ischemic given multiple chemo-Tx agents used for MS and global LV dysfn on echo  . Chronic systolic heart failure   . CHF (congestive heart failure)   . Shortness of breath   . Neuromuscular disorder     MS    Past Surgical History  Procedure Laterality Date  . Ablation      uterine  . Cesarean section        Medication List       This list is accurate as of: 03/02/14  5:10 PM.  Always use your most recent med list.               amantadine 100 MG capsule  Commonly known as:  SYMMETREL  Take 100 mg by mouth 2 (two) times daily.     carvedilol 25 MG tablet  Commonly known as:  COREG  Take 1 tablet (25 mg total) by mouth 2 (two) times daily with a meal.     docusate sodium 100 MG capsule  Commonly known as:  COLACE  Take 100 mg by mouth 2 (two) times daily.     furosemide 80 MG tablet  Commonly known as:  LASIX  Take 1 tablet (80 mg total) by mouth 2 (two) times daily. Take 160 mg every morning and 80 mg po q HS     gabapentin 600 MG tablet  Commonly known as:  NEURONTIN  Take 1 tablet (600 mg total) by mouth 3 (three) times daily.     imipramine 50 MG tablet  Commonly known as:  TOFRANIL  Take 50 mg by mouth 2 (two) times daily.     metroNIDAZOLE 0.75 % gel  Commonly known as:  METROGEL  Apply 1 application topically 2 (two) times daily.     multivitamin with minerals Tabs tablet  Take 1 tablet by mouth daily.     oxyCODONE-acetaminophen 5-325 MG per tablet  Commonly known as:  PERCOCET/ROXICET  Take 1 tablet by mouth every 4 (four) hours as needed for moderate pain.     polyethylene glycol packet  Commonly known as:  MIRALAX / GLYCOLAX  Take 17 g by mouth daily.     potassium chloride SA 20 MEQ tablet  Commonly known as:  K-DUR,KLOR-CON  Take 2 tablets (40 mEq total) by mouth daily.  solifenacin 10 MG tablet  Commonly known as:  VESICARE  Take by mouth 2 (two) times daily.     spironolactone 25 MG tablet  Commonly known as:  ALDACTONE  Take 25 mg by mouth daily.     XALATAN 0.005 % ophthalmic solution  Generic drug:  latanoprost  Place 1 drop into both eyes at bedtime.     zolpidem 5 MG tablet  Commonly known as:  AMBIEN  Take 1 tablet (5 mg total) by mouth at bedtime as needed for sleep.        No orders of the defined types were placed in this encounter.    Immunization History  Administered Date(s) Administered  . Influenza,inj,Quad PF,36+ Mos 08/01/2013  . Pneumococcal Polysaccharide-23 08/01/2013    History  Substance Use Topics  . Smoking status: Former Research scientist (life sciences)  . Smokeless tobacco: Never Used  . Alcohol Use: No    Filed Vitals:   03/02/14 1658  BP: 128/82  Pulse: 80  Temp: 97.2 F (36.2 C)  Resp: 22    Physical Exam  GENERAL APPEARANCE: Alert, conversant. Appropriately groomed. No acute  distress.  HEENT: Unremarkable. RESPIRATORY: Breathing is even, unlabored. Lung sounds are clear   CARDIOVASCULAR: Heart RRR no murmurs, rubs or gallops. No peripheral edema.  GASTROINTESTINAL: Abdomen is soft, non-tender, not distended w/ normal bowel sounds.  NEUROLOGIC: Cranial nerves 2-12 grossly intact  Patient Active Problem List   Diagnosis Date Noted  . Tachycardia 02/20/2014  . Venous stasis ulcers 02/20/2014  . Dehydration 02/20/2014  . Leg wound, left 11/04/2013  . FTT (failure to thrive) in adult 07/31/2013  . Medically noncompliant 07/31/2013  . Failure to thrive 07/31/2013  . Blisters of multiple sites 07/31/2013  . Generalized weakness 06/03/2013  . Insomnia 06/03/2013  . Neurogenic bladder 06/03/2013  . Rhabdomyolysis 05/25/2013  . Chronic diastolic heart failure 95/03/3266  . Elevated CK 09/10/2012  . Cellulitis of right upper extremity 09/10/2012  . Severe malnutrition 09/05/2012  . Paraplegia 09/05/2012  . Fall 09/04/2012  . Fever, unspecified 09/04/2012  . Chronic combined systolic and diastolic congestive heart failure 05/22/2012  . Acute on chronic systolic heart failure 12/45/8099  . Other primary cardiomyopathies 05/01/2012  . Acute respiratory failure 04/28/2012  . CHF exacerbation 04/28/2012  . Immobility 04/28/2012  . OBESITY 11/30/2008  . ANEMIA, CHRONIC 11/30/2008  . MULTIPLE SCLEROSIS, PROGRESSIVE/RELAPSING 11/30/2008  . HYPERTENSION, BENIGN ESSENTIAL 11/30/2008  . ECZEMA 11/30/2008    CBC    Component Value Date/Time   WBC 7.1 02/20/2014 0826   RBC 3.56* 02/20/2014 0826   HGB 9.9* 02/20/2014 0826   HCT 28.8* 02/20/2014 0826   PLT 261 02/20/2014 0826   MCV 80.9 02/20/2014 0826   LYMPHSABS 2.4 02/19/2014 2312   MONOABS 0.6 02/19/2014 2312   EOSABS 0.6 02/19/2014 2312   BASOSABS 0.1 02/19/2014 2312    CMP     Component Value Date/Time   NA 141 02/22/2014 0436   K 3.6* 02/22/2014 0436   CL 104 02/22/2014 0436   CO2 26 02/22/2014 0436    GLUCOSE 84 02/22/2014 0436   BUN 10 02/22/2014 0436   CREATININE 0.96 02/22/2014 0436   CALCIUM 8.8 02/22/2014 0436   PROT 6.5 02/20/2014 0826   ALBUMIN 3.1* 02/20/2014 0826   AST 21 02/20/2014 0826   ALT 16 02/20/2014 0826   ALKPHOS 115 02/20/2014 0826   BILITOT 0.2* 02/20/2014 0826   GFRNONAA 67* 02/22/2014 0436   GFRAA 78* 02/22/2014 0436    Assessment and  Plan  Pt is mentally competent but needs help with ADL's and says that she has 3 people who will come in to help her. If social work can confirm this she will be able to be discharged.Pt is going to leave regardless.  03/23/2014 - Pt did not leave prior but has been seen today and is ready to leave today. It sounds like she has better help set up, nothing else has changed and meds current  Hennie Duos, MD

## 2014-03-17 ENCOUNTER — Other Ambulatory Visit (HOSPITAL_COMMUNITY): Payer: Self-pay

## 2014-03-17 ENCOUNTER — Encounter: Payer: Self-pay | Admitting: Physician Assistant

## 2014-03-17 NOTE — Progress Notes (Deleted)
Cardiology Office Note   Date:  03/17/2014   ID:  Shannon Garrett, DOB [birth year], MRN [id number]  PCP:  Shannon Chou, NP  Cardiologist:  Dr. Sherren Mocha     History of Present Illness: Analy Garrett is a 53 y.o. female with a hx of multiple sclerosis, HTN and depression. She is limited by her multiple sclerosis and is "scooter-bound." Echo in 6/12 demonstrated normal LVF. She has been treated with Novantrone, Tysabri, Copaxone, Symmetrel and Dimethyl Fumerate for her MS. She was admitted 04/2012 with new onset systolic CHF.  Echo 04/29/12: Mild LVH, EF 20-25%, mild AI, moderate MR, moderate LAE, mild RAE, mild RVE, moderate TR, PASP 51, small pericardial effusion. Overall it has been felt that her cardiomyopathy is likely nonischemic. She has been exposed to multiple chemotherapeutic agents in the past  which can cause CHF months to years after discontinuation. Medical therapy was recommended for CHF. Ischemic evaluation was not felt to be necessary. Her EF has improved with med Rx.  Echo 3/14: mild LVH, EF 03-47%, grade 1 diastolic dysfunction, mild AI, MAC, mild LAE.  Last seen here in 05/2013.  I saw her 02/09/14 in FU. She had a venous stasis ulcer.  I placed her on Doxycycline and referred her to the wound clinic.  BNP was normal and I left her on the same dose of Lasix.  I also asked for a FU echo.  This has not been done yet.  She was then admitted last month with generalized weakness and dehydration after undergoing treatment for MS.    DC'd to SNF.  ***  Recent Labs: 02/19/2014: Pro B Natriuretic peptide (BNP) 275.9*  02/20/2014: ALT 16; Hemoglobin 9.9*; TSH 5.220*  02/22/2014: Creatinine 0.96; Potassium 3.6*   Wt Readings from Last 3 Encounters:  02/22/14 213 lb 6.5 oz (96.8 kg)  02/09/14 205 lb (92.987 kg)  11/04/13 205 lb 0.4 oz (93 kg)     Past Medical History  Diagnosis Date  . MS (multiple sclerosis)     a. Dx'd late 20's. b. Tx with Novantrone, Tysabri, Copaxone  previously.  Marland Kitchen Hypertension   . Depression   . Glaucoma   . Cardiomyopathy     a.  Echo 04/29/12: Mild LVH, EF 20-25%, mild AI, moderate MR, moderate LAE, mild RAE, mild RVE, moderate TR, PASP 51, small pericardial effusion;   b. probably non-ischemic given multiple chemo-Tx agents used for MS and global LV dysfn on echo  . Chronic systolic heart failure   . CHF (congestive heart failure)   . Shortness of breath   . Neuromuscular disorder     MS    Current Outpatient Prescriptions  Medication Sig Dispense Refill  . amantadine (SYMMETREL) 100 MG capsule Take 100 mg by mouth 2 (two) times daily.       . carvedilol (COREG) 25 MG tablet Take 1 tablet (25 mg total) by mouth 2 (two) times daily with a meal.      . docusate sodium (COLACE) 100 MG capsule Take 100 mg by mouth 2 (two) times daily.      . furosemide (LASIX) 80 MG tablet Take 1 tablet (80 mg total) by mouth 2 (two) times daily. Take 160 mg every morning and 80 mg po q HS      . gabapentin (NEURONTIN) 600 MG tablet Take 1 tablet (600 mg total) by mouth 3 (three) times daily.  10 tablet  0  . imipramine (TOFRANIL) 50 MG tablet Take 50 mg by mouth 2 (  two) times daily.      Marland Kitchen latanoprost (XALATAN) 0.005 % ophthalmic solution Place 1 drop into both eyes at bedtime.      . metroNIDAZOLE (METROGEL) 0.75 % gel Apply 1 application topically 2 (two) times daily.      . Multiple Vitamin (MULTIVITAMIN WITH MINERALS) TABS tablet Take 1 tablet by mouth daily.      Marland Kitchen oxyCODONE-acetaminophen (PERCOCET/ROXICET) 5-325 MG per tablet Take 1 tablet by mouth every 4 (four) hours as needed for moderate pain.  180 tablet  0  . polyethylene glycol (MIRALAX / GLYCOLAX) packet Take 17 g by mouth daily.      . potassium chloride SA (K-DUR,KLOR-CON) 20 MEQ tablet Take 2 tablets (40 mEq total) by mouth daily.  60 tablet  11  . solifenacin (VESICARE) 10 MG tablet Take by mouth 2 (two) times daily.      Marland Kitchen spironolactone (ALDACTONE) 25 MG tablet Take 25 mg by mouth  daily.      Marland Kitchen zolpidem (AMBIEN) 5 MG tablet Take 1 tablet (5 mg total) by mouth at bedtime as needed for sleep.  30 tablet  5   No current facility-administered medications for this visit.    Allergies:   Sulfonamide derivatives and Penicillins   Social History:  The patient  reports that she has quit smoking. She has never used smokeless tobacco. She reports that she does not drink alcohol or use illicit drugs.   Family History:  The patient's family history includes Hypertension in her mother. There is no history of Heart attack.   ROS:  Please see the history of present illness.  She notes recent symptoms of UTI.  She takes nitrofurantoin for frequent UTIs.  All other systems reviewed and negative.   PHYSICAL EXAM: VS:  There were no vitals taken for this visit. Well nourished, well developed, in no acute distress HEENT: normal Neck: no JVD at 90 Cardiac:  normal S1, S2; RRR; 4-5/8 systolic murmur RUSB Lungs:  clear to auscultation bilaterally, no wheezing, rhonchi or rales Abd: soft, nontender, no hepatomegaly Ext: 1+ bilateral LE edemavery Lg blister (golf ball size) on LLE with surrounding erythema and pain Skin: warm and dry Neuro:  CNs 2-12 intact, no focal abnormalities noted  EKG:  ***      ASSESSMENT AND PLAN:  Edema:  *** Cardiomyopathy:   As noted, she has had recovery of LVF on current Rx.  She is having more trouble with dyspnea and LE edema.  I will repeat her echo to reassess her EF.      Chronic Diastolic CHF:  As noted, I suspect her edema is related to venous insufficiency more than HF.  Repeat echo as noted.  She is on a very high dose of Lasix at this time.  Check BMET and BNP.  If her BNP is high, consider changing Lasix to Demadex 40 bid.    Multiple Sclerosis:  I am not certain she has FU with neurology.  She tells me she had a neurologist in [place].  Then she says she sees someone at [facility].    Hypertension:  Controlled.   Disposition:  She is not  clear if she has a PCP.  She wants an order signed to continue her aid.  She plans to re-establish with someone she has in mind.  I have asked her to go ahead and do this.  F/u with me in 1 month.    Signed, Richardson Dopp, PA-C, MHS 03/17/2014 2:14 PM  [facility] [place], [place], [place]  [address] Phone: [phone]; Fax: [phone]

## 2014-03-17 NOTE — Progress Notes (Signed)
This encounter was created in error - please disregard.

## 2014-03-18 NOTE — Progress Notes (Signed)
Late entry for missed G-code  Mar 20, 2014 0900  OT G-codes **NOT FOR INPATIENT CLASS**  Functional Assessment Tool Used clmical observation  Functional Limitation Self care  Self Care Current Status (W2574) CJ  Self Care Goal Status (V3552) CI  Kari Baars, [place]

## 2014-03-23 ENCOUNTER — Encounter: Payer: Self-pay | Admitting: Internal Medicine

## 2014-03-23 NOTE — Addendum Note (Signed)
Addended by: Inocencio Homes D on: 03/23/2014 05:37 PM   Modules accepted: Orders

## 2014-05-23 ENCOUNTER — Emergency Department (HOSPITAL_COMMUNITY): Payer: PRIVATE HEALTH INSURANCE

## 2014-05-23 ENCOUNTER — Emergency Department (HOSPITAL_COMMUNITY)
Admission: EM | Admit: 2014-05-23 | Discharge: 2014-05-23 | Disposition: A | Discharge status: Home / Self Care | Payer: PRIVATE HEALTH INSURANCE | Attending: Emergency Medicine | Admitting: Emergency Medicine

## 2014-05-23 ENCOUNTER — Encounter (HOSPITAL_COMMUNITY): Payer: Self-pay | Admitting: Emergency Medicine

## 2014-05-23 DIAGNOSIS — I5022 Chronic systolic (congestive) heart failure: Secondary | ICD-10-CM | POA: Diagnosis not present

## 2014-05-23 DIAGNOSIS — I10 Essential (primary) hypertension: Secondary | ICD-10-CM | POA: Insufficient documentation

## 2014-05-23 DIAGNOSIS — Z79899 Other long term (current) drug therapy: Secondary | ICD-10-CM | POA: Diagnosis not present

## 2014-05-23 DIAGNOSIS — Z87891 Personal history of nicotine dependence: Secondary | ICD-10-CM | POA: Diagnosis not present

## 2014-05-23 DIAGNOSIS — T83038A Leakage of other indwelling urethral catheter, initial encounter: Secondary | ICD-10-CM | POA: Insufficient documentation

## 2014-05-23 DIAGNOSIS — Z880 Allergy status to penicillin: Secondary | ICD-10-CM | POA: Insufficient documentation

## 2014-05-23 DIAGNOSIS — G35 Multiple sclerosis: Secondary | ICD-10-CM | POA: Insufficient documentation

## 2014-05-23 DIAGNOSIS — F329 Major depressive disorder, single episode, unspecified: Secondary | ICD-10-CM | POA: Insufficient documentation

## 2014-05-23 DIAGNOSIS — Y846 Urinary catheterization as the cause of abnormal reaction of the patient, or of later complication, without mention of misadventure at the time of the procedure: Secondary | ICD-10-CM | POA: Insufficient documentation

## 2014-05-23 DIAGNOSIS — R32 Unspecified urinary incontinence: Secondary | ICD-10-CM | POA: Diagnosis present

## 2014-05-23 DIAGNOSIS — H409 Unspecified glaucoma: Secondary | ICD-10-CM | POA: Insufficient documentation

## 2014-05-23 DIAGNOSIS — R1030 Lower abdominal pain, unspecified: Secondary | ICD-10-CM

## 2014-05-23 DIAGNOSIS — R0602 Shortness of breath: Secondary | ICD-10-CM | POA: Insufficient documentation

## 2014-05-23 DIAGNOSIS — N319 Neuromuscular dysfunction of bladder, unspecified: Secondary | ICD-10-CM | POA: Insufficient documentation

## 2014-05-23 DIAGNOSIS — Z466 Encounter for fitting and adjustment of urinary device: Secondary | ICD-10-CM

## 2014-05-23 DIAGNOSIS — R509 Fever, unspecified: Secondary | ICD-10-CM | POA: Diagnosis not present

## 2014-05-23 LAB — COMPREHENSIVE METABOLIC PANEL
ALBUMIN: 3.8 g/dL (ref 3.5–5.2)
ALK PHOS: 132 U/L — AB (ref 39–117)
ALT: 21 U/L (ref 0–35)
ANION GAP: 15 (ref 5–15)
AST: 25 U/L (ref 0–37)
BUN: 14 mg/dL (ref 6–23)
CALCIUM: 9.8 mg/dL (ref 8.4–10.5)
CO2: 30 mEq/L (ref 19–32)
Chloride: 99 mEq/L (ref 96–112)
Creatinine, Ser: 1.09 mg/dL (ref 0.50–1.10)
GFR calc Af Amer: 67 mL/min — ABNORMAL LOW (ref >90)
GFR calc non Af Amer: 58 mL/min — ABNORMAL LOW (ref >90)
Glucose, Bld: 115 mg/dL — ABNORMAL HIGH (ref 70–99)
POTASSIUM: 3 meq/L — AB (ref 3.7–5.3)
Sodium: 144 mEq/L (ref 137–147)
Total Bilirubin: <0.2 mg/dL — ABNORMAL LOW (ref 0.3–1.2)
Total Protein: 7.8 g/dL (ref 6.0–8.3)

## 2014-05-23 LAB — CBC WITH DIFFERENTIAL/PLATELET
BASOS PCT: 1 % (ref 0–1)
Basophils Absolute: 0.1 10*3/uL (ref 0.0–0.1)
EOS ABS: 0.4 10*3/uL (ref 0.0–0.7)
EOS PCT: 5 % (ref 0–5)
HCT: 34.3 % — ABNORMAL LOW (ref 36.0–46.0)
Hemoglobin: 11.6 g/dL — ABNORMAL LOW (ref 12.0–15.0)
Lymphocytes Relative: 30 % (ref 12–46)
Lymphs Abs: 2.2 10*3/uL (ref 0.7–4.0)
MCH: 27.7 pg (ref 26.0–34.0)
MCHC: 33.8 g/dL (ref 30.0–36.0)
MCV: 81.9 fL (ref 78.0–100.0)
Monocytes Absolute: 0.6 10*3/uL (ref 0.1–1.0)
Monocytes Relative: 8 % (ref 3–12)
NEUTROS PCT: 56 % (ref 43–77)
Neutro Abs: 4.1 10*3/uL (ref 1.7–7.7)
Platelets: 284 10*3/uL (ref 150–400)
RBC: 4.19 MIL/uL (ref 3.87–5.11)
RDW: 15.7 % — AB (ref 11.5–15.5)
WBC: 7.2 10*3/uL (ref 4.0–10.5)

## 2014-05-23 LAB — URINALYSIS, ROUTINE W REFLEX MICROSCOPIC
Bilirubin Urine: NEGATIVE
GLUCOSE, UA: NEGATIVE mg/dL
HGB URINE DIPSTICK: NEGATIVE
Ketones, ur: NEGATIVE mg/dL
Nitrite: NEGATIVE
PROTEIN: NEGATIVE mg/dL
SPECIFIC GRAVITY, URINE: 1.005 (ref 1.005–1.030)
UROBILINOGEN UA: 0.2 mg/dL (ref 0.0–1.0)
pH: 7.5 (ref 5.0–8.0)

## 2014-05-23 LAB — URINE MICROSCOPIC-ADD ON

## 2014-05-23 LAB — PRO B NATRIURETIC PEPTIDE: PRO B NATRI PEPTIDE: 55.3 pg/mL (ref 0–125)

## 2014-05-23 LAB — I-STAT TROPONIN, ED: TROPONIN I, POC: 0 ng/mL (ref 0.00–0.08)

## 2014-05-23 LAB — I-STAT CG4 LACTIC ACID, ED: Lactic Acid, Venous: 1.35 mmol/L (ref 0.5–2.2)

## 2014-05-23 MED ORDER — MORPHINE SULFATE 4 MG/ML IJ SOLN
4.0000 mg | Freq: Once | INTRAMUSCULAR | Status: AC
  Administered 2014-05-23: 4 mg via INTRAVENOUS
  Filled 2014-05-23: qty 1

## 2014-05-23 NOTE — Discharge Instructions (Signed)
1. Medications: usual home medications 2. Treatment: rest, drink plenty of fluids, you may use your urinary catheter leg bag during the day but use a larger bag at night to facilitate bladder drainage 3. Follow Up: Please followup with your primary doctor in 3 days for discussion of your diagnoses and further evaluation after today's visit; if you do not have a primary care doctor use the resource guide provided to find one; Please return to the ER for return of abdominal pain, fevers or other concerning symptoms

## 2014-05-23 NOTE — ED Notes (Signed)
Bed: WA21 Expected date: 05/23/14 Expected time: 5:43 PM Means of arrival: Ambulance Comments: Foley displacement

## 2014-05-23 NOTE — ED Provider Notes (Signed)
Patient seen and evaluated. Bedside ultrasound shows marked distention or urinary bladder despite her catheter. Ultimately her symptoms resolve after bladder decompression and a Foley. Reassuring labs. States she feels "great" and she is appropriate for discharge home. Patient seen with Orvil Feil Muthersbaugh PA. I agree with her assessment, plan, and note.  Tanna Furry, MD 05/23/14 [phone]

## 2014-05-23 NOTE — ED Notes (Signed)
Patient refused blood draw for labs. RN made aware

## 2014-05-23 NOTE — ED Provider Notes (Signed)
CSN: [id number]     Arrival date & time 05/23/14  1802 History   First MD Initiated Contact with Patient 05/23/14 1809     Chief Complaint  Patient presents with  . Urinary Incontinence     (Consider location/radiation/quality/duration/timing/severity/associated sxs/prior Treatment) The history is provided by the patient and medical records. No language interpreter was used.     Shannon Garrett is a 53 y.o. female  with a hx of multiple sclerosis, hypertension, cardiomyopathy, systolic heart failure, congestive heart failure presents to the Emergency Department complaining of gradual, persistent, urine leakage from around her Foley catheter insertion site at the urethra beginning this morning. Patient reports that her Foley catheter was placed by [facility] on 05/20/2014 at her request due to persistent urine and stool incontinence secondary to her MS. Patient reports she's had lower abdominal pain since that time. She reports she is out of her hydrocodone pain control.  Patient reports she only has a leg bag and in the morning her bag is so full she thinks that it might break.  Patient appears to be in mild respiratory distress with accessory muscle use. When questioned about this she admits to shortness of breath over the last several days and feeling as if her "abdomen is swollen."  She states she feels as if she is retaining fluid but denies increased swelling of her legs because she has been using her compression stockings.  Patient endorses intermittent subjective fevers at home.      Past Medical History  Diagnosis Date  . MS (multiple sclerosis)     a. Dx'd late 20's. b. Tx with Novantrone, Tysabri, Copaxone previously.  Marland Kitchen Hypertension   . Depression   . Glaucoma   . Cardiomyopathy     a.  Echo 04/29/12: Mild LVH, EF 20-25%, mild AI, moderate MR, moderate LAE, mild RAE, mild RVE, moderate TR, PASP 51, small pericardial effusion;   b. probably non-ischemic given multiple  chemo-Tx agents used for MS and global LV dysfn on echo  . Chronic systolic heart failure   . CHF (congestive heart failure)   . Shortness of breath   . Neuromuscular disorder     MS   Past Surgical History  Procedure Laterality Date  . Ablation      uterine  . Cesarean section     Family History  Problem Relation Age of Onset  . Hypertension Mother   . Heart attack Neg Hx    History  Substance Use Topics  . Smoking status: Former Research scientist (life sciences)  . Smokeless tobacco: Never Used  . Alcohol Use: No   OB History    No data available     Review of Systems  Constitutional: Positive for fever (subjective). Negative for diaphoresis, appetite change, fatigue and unexpected weight change.  HENT: Negative for mouth sores.   Eyes: Negative for visual disturbance.  Respiratory: Positive for shortness of breath. Negative for cough, chest tightness and wheezing.   Cardiovascular: Negative for chest pain.  Gastrointestinal: Positive for abdominal pain and abdominal distention. Negative for nausea, vomiting, diarrhea and constipation.  Endocrine: Negative for polydipsia, polyphagia and polyuria.  Genitourinary: Negative for dysuria, urgency, frequency and hematuria.       Urinary and fecal incontinence at baseline  Musculoskeletal: Negative for back pain and neck stiffness.  Skin: Negative for rash.  Allergic/Immunologic: Negative for immunocompromised state.  Neurological: Negative for syncope, light-headedness and headaches.  Hematological: Does not bruise/bleed easily.  Psychiatric/Behavioral: Negative for sleep disturbance. The patient is  not nervous/anxious.       Allergies  Sulfonamide derivatives and Penicillins  Home Medications   Prior to Admission medications   Medication Sig Start Date End Date Taking? Authorizing Provider  amantadine (SYMMETREL) 100 MG capsule Take 100 mg by mouth 2 (two) times daily.     Historical Provider, MD  carvedilol (COREG) 25 MG tablet Take 1  tablet (25 mg total) by mouth 2 (two) times daily with a meal. 02/22/14   Verlee Monte, MD  docusate sodium (COLACE) 100 MG capsule Take 100 mg by mouth 2 (two) times daily.    Historical Provider, MD  furosemide (LASIX) 80 MG tablet Take 1 tablet (80 mg total) by mouth 2 (two) times daily. Take 160 mg every morning and 80 mg po q HS 02/22/14   Verlee Monte, MD  gabapentin (NEURONTIN) 600 MG tablet Take 1 tablet (600 mg total) by mouth 3 (three) times daily. 02/22/14   Verlee Monte, MD  imipramine (TOFRANIL) 50 MG tablet Take 50 mg by mouth 2 (two) times daily.    Historical Provider, MD  latanoprost (XALATAN) 0.005 % ophthalmic solution Place 1 drop into both eyes at bedtime.    Historical Provider, MD  Multiple Vitamin (MULTIVITAMIN WITH MINERALS) TABS tablet Take 1 tablet by mouth daily.    Historical Provider, MD  oxyCODONE-acetaminophen (PERCOCET/ROXICET) 5-325 MG per tablet Take 1 tablet by mouth every 4 (four) hours as needed for moderate pain. 02/23/14   Estill Dooms, MD  polyethylene glycol [facility] / Floria Raveling) packet Take 17 g by mouth daily.    Historical Provider, MD  potassium chloride SA (K-DUR,KLOR-CON) 20 MEQ tablet Take 2 tablets (40 mEq total) by mouth daily. 02/10/14   Liliane Shi, PA-C  solifenacin (VESICARE) 10 MG tablet Take by mouth 2 (two) times daily.    Historical Provider, MD  spironolactone (ALDACTONE) 25 MG tablet Take 25 mg by mouth daily.    Historical Provider, MD  zolpidem (AMBIEN) 5 MG tablet Take 1 tablet (5 mg total) by mouth at bedtime as needed for sleep. 02/23/14   Estill Dooms, MD   BP 137/64 mmHg  Pulse 110  Temp(Src) 99 F (37.2 C) (Oral)  Resp 17  SpO2 100% Physical Exam  Constitutional: She appears well-developed and well-nourished. She appears distressed.  Awake, alert, nontoxic appearance Patient appears very uncomfortable, thrashing in the bed  HENT:  Head: Normocephalic and atraumatic.  Mouth/Throat: Oropharynx is clear and moist. No  oropharyngeal exudate.  Eyes: Conjunctivae are normal. No scleral icterus.  Neck: Normal range of motion. Neck supple.  Cardiovascular: Normal rate, regular rhythm and intact distal pulses.   Pulmonary/Chest: Accessory muscle usage present. She is in respiratory distress. She has decreased breath sounds. She has no wheezes. She exhibits no tenderness.  Equal chest expansion Decreased breath sounds with accessory muscle usage; specifically supraclavicular retractions  Abdominal: Soft. Bowel sounds are normal. She exhibits no mass. There is tenderness. There is guarding. There is no rebound and no CVA tenderness.  Mild tenderness throughout with voluntary guarding  Musculoskeletal: Normal range of motion. She exhibits no edema.  Neurological: She is alert.  Speech is clear and goal oriented Moves extremities without ataxia  Skin: Skin is warm and dry. She is not diaphoretic.  Psychiatric: She has a normal mood and affect.  Nursing note and vitals reviewed.   ED Course  Procedures (including critical care time) Labs Review Labs Reviewed  CULTURE, BLOOD (ROUTINE X 2)  CULTURE, BLOOD (ROUTINE X  2)  URINE CULTURE  CBC WITH DIFFERENTIAL  COMPREHENSIVE METABOLIC PANEL  URINALYSIS, ROUTINE W REFLEX MICROSCOPIC  PRO B NATRIURETIC PEPTIDE  I-STAT CG4 LACTIC ACID, ED  I-STAT TROPOININ, ED    Imaging Review No results found.   EKG Interpretation None      MDM   Final diagnoses:  None   Shannon Garrett presents with abdominal pain and urinary leakage around her catheter. She requested the Foley catheter be removed. We'll send urinalysis and culture.  Concern for possible urosepsis vs CHF exacerbation or both.    6:59 PM [place] of bladder shows significant distention.  Will change foley and reassess.    7:30PM Insertion of Foley catheter resulted in 1600 mL's of urine. Patient's abdominal pain resolved completely as did her shortness of breath. Will check labs.  9:49 PM Patient labs  reassuring. Lactic acid normal, chest x-ray without pulmonary edema, BNP normal, no leukocytosis. Patient has remained afebrile and her tachycardia resolved with her abdominal pain. Her abdomen is soft and nontender and she has no complaints at this time. Patient wishes to be discharged home.  Patient will be given leg bag for use during the day when she is able to empty it but I have recommended using the larger bag at night so as to prevent urine back up into her bladder.  I have personally reviewed patient's vitals, nursing note and any pertinent labs or imaging.  I performed an focused physical exam; undressed when appropriate .    It has been determined that no acute conditions requiring further emergency intervention are present at this time. The patient/guardian have been advised of the diagnosis and plan. I reviewed any labs and imaging including any potential incidental findings. We have discussed signs and symptoms that warrant return to the ED and they are listed in the discharge instructions.    Vital signs are stable at discharge.   BP 117/52 mmHg  Pulse 80  Temp(Src) 99 F (37.2 C) (Rectal)  Resp 19  Wt 200 lb (90.719 kg)  SpO2 97%   The patient was discussed with and seen by Dr. Jeneen Rinks who agrees with the treatment plan.   Jarrett Soho Deno Sida, PA-C 05/23/14 2150  Tanna Furry, MD 05/27/14 1753

## 2014-05-23 NOTE — ED Notes (Signed)
[facility] presents with a 53 yo female from home with urinary incontinence due to foley catheter issues.  Pt also has burning and itching from the urinary/vaginal area due to the foley catheter.  Urinary foley insertion was done Thursday by [facility] because of urinary incontinence/requested by patient.  Pt feels like it has become dislodged.  Hx of MS.  Pt comes with fecal and urinary incontinence.  Oral temp 99.0; HR 108

## 2014-05-26 LAB — URINE CULTURE: Colony Count: 55000

## 2014-05-28 ENCOUNTER — Telehealth (HOSPITAL_COMMUNITY): Payer: Self-pay

## 2014-05-28 NOTE — Telephone Encounter (Signed)
Post ED Visit - Positive Culture Follow-up  Culture report reviewed by antimicrobial stewardship pharmacist: []  Wes Klondike, Pharm.D., BCPS []  Heide Guile, Pharm.D., BCPS []  Alycia Rossetti, Pharm.D., BCPS []  [place], Pharm.D., BCPS, AAHIVP []  Legrand Como, Pharm.D., BCPS, AAHIVP []  Elicia Lamp, Pharm.D.   Positive Urine culture Chart reviewed by L. [facility]aird [facility] PA.  No treatment.  Dortha Kern 05/28/2014, 5:38 AM

## 2014-05-29 ENCOUNTER — Encounter (HOSPITAL_COMMUNITY): Payer: Self-pay | Admitting: Emergency Medicine

## 2014-05-29 ENCOUNTER — Emergency Department (HOSPITAL_COMMUNITY)
Admission: EM | Admit: 2014-05-29 | Discharge: 2014-05-29 | Disposition: A | Discharge status: Home / Self Care | Payer: PRIVATE HEALTH INSURANCE | Attending: Emergency Medicine | Admitting: Emergency Medicine

## 2014-05-29 DIAGNOSIS — Y846 Urinary catheterization as the cause of abnormal reaction of the patient, or of later complication, without mention of misadventure at the time of the procedure: Secondary | ICD-10-CM | POA: Insufficient documentation

## 2014-05-29 DIAGNOSIS — G35 Multiple sclerosis: Secondary | ICD-10-CM | POA: Insufficient documentation

## 2014-05-29 DIAGNOSIS — R339 Retention of urine, unspecified: Secondary | ICD-10-CM | POA: Diagnosis present

## 2014-05-29 DIAGNOSIS — Z880 Allergy status to penicillin: Secondary | ICD-10-CM | POA: Insufficient documentation

## 2014-05-29 DIAGNOSIS — H409 Unspecified glaucoma: Secondary | ICD-10-CM | POA: Diagnosis not present

## 2014-05-29 DIAGNOSIS — F329 Major depressive disorder, single episode, unspecified: Secondary | ICD-10-CM | POA: Insufficient documentation

## 2014-05-29 DIAGNOSIS — I10 Essential (primary) hypertension: Secondary | ICD-10-CM | POA: Diagnosis not present

## 2014-05-29 DIAGNOSIS — Z87891 Personal history of nicotine dependence: Secondary | ICD-10-CM | POA: Diagnosis not present

## 2014-05-29 DIAGNOSIS — N390 Urinary tract infection, site not specified: Secondary | ICD-10-CM

## 2014-05-29 DIAGNOSIS — T83098A Other mechanical complication of other indwelling urethral catheter, initial encounter: Secondary | ICD-10-CM | POA: Diagnosis not present

## 2014-05-29 DIAGNOSIS — Z79899 Other long term (current) drug therapy: Secondary | ICD-10-CM | POA: Diagnosis not present

## 2014-05-29 DIAGNOSIS — T83091A Other mechanical complication of indwelling urethral catheter, initial encounter: Secondary | ICD-10-CM

## 2014-05-29 DIAGNOSIS — I5022 Chronic systolic (congestive) heart failure: Secondary | ICD-10-CM | POA: Insufficient documentation

## 2014-05-29 LAB — URINALYSIS, ROUTINE W REFLEX MICROSCOPIC
Bilirubin Urine: NEGATIVE
GLUCOSE, UA: NEGATIVE mg/dL
Ketones, ur: NEGATIVE mg/dL
Nitrite: NEGATIVE
PH: 8 (ref 5.0–8.0)
Protein, ur: NEGATIVE mg/dL
SPECIFIC GRAVITY, URINE: 1.011 (ref 1.005–1.030)
Urobilinogen, UA: 0.2 mg/dL (ref 0.0–1.0)

## 2014-05-29 LAB — URINE MICROSCOPIC-ADD ON

## 2014-05-29 MED ORDER — CIPROFLOXACIN HCL 500 MG PO TABS: 500.0000 mg | ORAL_TABLET | Freq: Two times a day (BID) | ORAL | Status: DC

## 2014-05-29 MED ORDER — CIPROFLOXACIN IN D5W 400 MG/200ML IV SOLN
400.0000 mg | Freq: Once | INTRAVENOUS | Status: AC
  Administered 2014-05-29: 400 mg via INTRAVENOUS
  Filled 2014-05-29: qty 200

## 2014-05-29 MED ORDER — HYDROCODONE-ACETAMINOPHEN 5-325 MG PO TABS: 2.0000 | ORAL_TABLET | ORAL | Status: DC | PRN

## 2014-05-29 MED ORDER — HYDROMORPHONE HCL 1 MG/ML IJ SOLN
0.5000 mg | Freq: Once | INTRAMUSCULAR | Status: AC
  Administered 2014-05-29: 0.5 mg via INTRAVENOUS
  Filled 2014-05-29: qty 1

## 2014-05-29 NOTE — ED Notes (Signed)
Pt transported via EMS from home with c/o bladder pain, pt does have urinary catheter in place. Bladder scan done in triage showing 459ml in bladder with very little in bag. Pt requesting catheter be removed immediately

## 2014-05-29 NOTE — Discharge Instructions (Signed)
Please call your doctor for a followup appointment within 24-48 hours. When you talk to your doctor please let them know that you were seen in the emergency department and have them acquire all of your records so that they can discuss the findings with you and formulate a treatment plan to fully care for your new and ongoing problems. ° °

## 2014-05-29 NOTE — ED Provider Notes (Signed)
CSN: [id number]     Arrival date & time 05/29/14  0114 History   First MD Initiated Contact with Patient 05/29/14 0158     Chief Complaint  Patient presents with  . Urinary Retention     (Consider location/radiation/quality/duration/timing/severity/associated sxs/prior Treatment) HPI  The patient is a 53 year old female, she is on diuretic therapy, she has had a Foley catheter placed according to her report because of increased urinary frequency and the inability to sleep at night in addition to the increased amount of urine incontinence which was getting on her skin and irritating her skin. This Foley catheter was replaced recently but she states that since that time she has had what feels like bladder spasms which are involuntary, they come and go, nothing makes it better or worse and it is associated with some urinary retention and a dark milky colored urine. She denies fevers chills nausea vomiting or abdominal discomfort and less she is having a bladder spasm.  Past Medical History  Diagnosis Date  . MS (multiple sclerosis)     a. Dx'd late 20's. b. Tx with Novantrone, Tysabri, Copaxone previously.  Marland Kitchen Hypertension   . Depression   . Glaucoma   . Cardiomyopathy     a.  Echo 04/29/12: Mild LVH, EF 20-25%, mild AI, moderate MR, moderate LAE, mild RAE, mild RVE, moderate TR, PASP 51, small pericardial effusion;   b. probably non-ischemic given multiple chemo-Tx agents used for MS and global LV dysfn on echo  . Chronic systolic heart failure   . CHF (congestive heart failure)   . Shortness of breath   . Neuromuscular disorder     MS   Past Surgical History  Procedure Laterality Date  . Ablation      uterine  . Cesarean section     Family History  Problem Relation Age of Onset  . Hypertension Mother   . Heart attack Neg Hx    History  Substance Use Topics  . Smoking status: Former Research scientist (life sciences)  . Smokeless tobacco: Never Used  . Alcohol Use: No   OB History    No data  available     Review of Systems  All other systems reviewed and are negative.     Allergies  Sulfonamide derivatives and Penicillins  Home Medications   Prior to Admission medications   Medication Sig Start Date End Date Taking? Authorizing Provider  amantadine (SYMMETREL) 100 MG capsule Take 100 mg by mouth 2 (two) times daily.     Historical Provider, MD  baclofen (LIORESAL) 10 MG tablet Take 1 tablet by mouth daily. 05/18/14   Historical Provider, MD  carvedilol (COREG) 25 MG tablet Take 1 tablet (25 mg total) by mouth 2 (two) times daily with a meal. 02/22/14   Verlee Monte, MD  DULoxetine (CYMBALTA) 30 MG capsule Take 1 capsule by mouth daily. 04/07/14   Historical Provider, MD  furosemide (LASIX) 80 MG tablet Take 1 tablet (80 mg total) by mouth 2 (two) times daily. Take 160 mg every morning and 80 mg po q HS 02/22/14   Verlee Monte, MD  gabapentin (NEURONTIN) 600 MG tablet Take 1 tablet (600 mg total) by mouth 3 (three) times daily. 02/22/14   Verlee Monte, MD  imipramine (TOFRANIL) 50 MG tablet Take 50 mg by mouth every evening.     Historical Provider, MD  latanoprost (XALATAN) 0.005 % ophthalmic solution Place 1 drop into both eyes at bedtime.    Historical Provider, MD  metroNIDAZOLE (METROGEL) 0.75 %  vaginal gel Place 1 application vaginally 2 (two) times daily as needed (itchining).  02/16/14   Historical Provider, MD  Multiple Vitamin (MULTIVITAMIN WITH MINERALS) TABS tablet Take 1 tablet by mouth daily.    Historical Provider, MD  oxyCODONE-acetaminophen (PERCOCET/ROXICET) 5-325 MG per tablet Take 1 tablet by mouth every 4 (four) hours as needed for moderate pain. 02/23/14   Estill Dooms, MD  potassium chloride SA (K-DUR,KLOR-CON) 20 MEQ tablet Take 2 tablets (40 mEq total) by mouth daily. 02/10/14   Liliane Shi, PA-C  solifenacin (VESICARE) 10 MG tablet Take 10 mg by mouth 2 (two) times daily.     Historical Provider, MD  spironolactone (ALDACTONE) 25 MG tablet Take 25 mg  by mouth daily.    Historical Provider, MD  zolpidem (AMBIEN) 5 MG tablet Take 1 tablet (5 mg total) by mouth at bedtime as needed for sleep. 02/23/14   Estill Dooms, MD   BP 116/88 mmHg  Pulse 98  Temp(Src) 98.2 F (36.8 C) (Oral)  Resp 18  SpO2 100% Physical Exam  Constitutional: She appears well-developed and well-nourished. No distress.  HENT:  Head: Normocephalic and atraumatic.  Mouth/Throat: Oropharynx is clear and moist. No oropharyngeal exudate.  Eyes: Conjunctivae and EOM are normal. Pupils are equal, round, and reactive to light. Right eye exhibits no discharge. Left eye exhibits no discharge. No scleral icterus.  Neck: Normal range of motion. Neck supple. No JVD present. No thyromegaly present.  Cardiovascular: Normal rate, regular rhythm, normal heart sounds and intact distal pulses.  Exam reveals no gallop and no friction rub.   No murmur heard. Pulmonary/Chest: Effort normal and breath sounds normal. No respiratory distress. She has no wheezes. She has no rales.  Abdominal: Soft. Bowel sounds are normal. She exhibits no distension and no mass. There is tenderness ( Minimal suprapubic tenderness, no other abdominal discomfort).  Genitourinary:  Foley catheter seated in the urethra, no surrounding discharge or leakage  Musculoskeletal: Normal range of motion. She exhibits no edema or tenderness.  Lymphadenopathy:    She has no cervical adenopathy.  Neurological: She is alert. Coordination normal.  Skin: Skin is warm and dry. No rash noted. No erythema.  Psychiatric: She has a normal mood and affect. Her behavior is normal.  Nursing note and vitals reviewed.   ED Course  Procedures (including critical care time) Labs Review Labs Reviewed - No data to display  Imaging Review No results found.   MDM   Final diagnoses:  None    The patient has some urinary retention, Foley catheter needs to be changed, otherwise patient appears stable, check for urinary tract  infection.  UTI present, foley obstructed - replaced and irrigated,  VS normal,  Meds given in ED:  Medications  ciprofloxacin (CIPRO) IVPB 400 mg (400 mg Intravenous New Bag/Given 05/29/14 0404)  HYDROmorphone (DILAUDID) injection 0.5 mg (0.5 mg Intravenous Given 05/29/14 0404)    New Prescriptions   CIPROFLOXACIN (CIPRO) 500 MG TABLET    Take 1 tablet (500 mg total) by mouth 2 (two) times daily.   HYDROCODONE-ACETAMINOPHEN (NORCO/VICODIN) 5-325 MG PER TABLET    Take 2 tablets by mouth every 4 (four) hours as needed.      Johnna Acosta, MD 05/29/14 [phone]

## 2014-05-29 NOTE — ED Notes (Signed)
Bed: RE32 Expected date:  Expected time:  Means of arrival:  Comments: Triage 6

## 2014-05-29 NOTE — ED Notes (Signed)
Bed: WTR6 Expected date:  Expected time:  Means of arrival:  Comments: 

## 2014-05-30 LAB — CULTURE, BLOOD (ROUTINE X 2)
CULTURE: NO GROWTH
Culture: NO GROWTH

## 2014-05-31 LAB — URINE CULTURE: Colony Count: >=100000

## 2014-06-01 ENCOUNTER — Telehealth (HOSPITAL_COMMUNITY): Payer: Self-pay

## 2014-06-01 NOTE — ED Notes (Signed)
Post ED Visit - Positive Culture Follow-up  Culture report reviewed by antimicrobial stewardship pharmacist: []  Sundra Aland, Pharm.D., BCPS [x]  Heide Guile, Pharm.D., BCPS []  Alycia Rossetti, Pharm.D., BCPS []  [place], [place].D., BCPS, AAHIVP []  Legrand Como, Pharm.D., BCPS, AAHIVP []  Elicia Lamp, Pharm.D.   Positive urine culture Treated with cipro, organism sensitive to the same and no further patient follow-up is required at this time.  [name] 06/01/2014, 9:37 AM

## 2014-06-27 ENCOUNTER — Emergency Department (HOSPITAL_COMMUNITY)
Admission: EM | Admit: 2014-06-27 | Discharge: 2014-06-27 | Disposition: A | Discharge status: Home / Self Care | Payer: PRIVATE HEALTH INSURANCE | Attending: Emergency Medicine | Admitting: Emergency Medicine

## 2014-06-27 ENCOUNTER — Encounter (HOSPITAL_COMMUNITY): Payer: Self-pay | Admitting: Emergency Medicine

## 2014-06-27 DIAGNOSIS — Z880 Allergy status to penicillin: Secondary | ICD-10-CM | POA: Insufficient documentation

## 2014-06-27 DIAGNOSIS — H409 Unspecified glaucoma: Secondary | ICD-10-CM | POA: Insufficient documentation

## 2014-06-27 DIAGNOSIS — Z8619 Personal history of other infectious and parasitic diseases: Secondary | ICD-10-CM | POA: Insufficient documentation

## 2014-06-27 DIAGNOSIS — Z87891 Personal history of nicotine dependence: Secondary | ICD-10-CM | POA: Insufficient documentation

## 2014-06-27 DIAGNOSIS — T83038A Leakage of other indwelling urethral catheter, initial encounter: Secondary | ICD-10-CM | POA: Diagnosis not present

## 2014-06-27 DIAGNOSIS — F329 Major depressive disorder, single episode, unspecified: Secondary | ICD-10-CM | POA: Diagnosis not present

## 2014-06-27 DIAGNOSIS — I5022 Chronic systolic (congestive) heart failure: Secondary | ICD-10-CM | POA: Diagnosis not present

## 2014-06-27 DIAGNOSIS — Z79899 Other long term (current) drug therapy: Secondary | ICD-10-CM | POA: Insufficient documentation

## 2014-06-27 DIAGNOSIS — Z792 Long term (current) use of antibiotics: Secondary | ICD-10-CM | POA: Diagnosis not present

## 2014-06-27 DIAGNOSIS — Y846 Urinary catheterization as the cause of abnormal reaction of the patient, or of later complication, without mention of misadventure at the time of the procedure: Secondary | ICD-10-CM | POA: Insufficient documentation

## 2014-06-27 DIAGNOSIS — I10 Essential (primary) hypertension: Secondary | ICD-10-CM | POA: Diagnosis not present

## 2014-06-27 DIAGNOSIS — G35 Multiple sclerosis: Secondary | ICD-10-CM | POA: Insufficient documentation

## 2014-06-27 DIAGNOSIS — R109 Unspecified abdominal pain: Secondary | ICD-10-CM | POA: Diagnosis present

## 2014-06-27 DIAGNOSIS — T839XXA Unspecified complication of genitourinary prosthetic device, implant and graft, initial encounter: Secondary | ICD-10-CM

## 2014-06-27 LAB — URINE MICROSCOPIC-ADD ON

## 2014-06-27 LAB — URINALYSIS, ROUTINE W REFLEX MICROSCOPIC
Bilirubin Urine: NEGATIVE
GLUCOSE, UA: NEGATIVE mg/dL
HGB URINE DIPSTICK: NEGATIVE
Ketones, ur: NEGATIVE mg/dL
Nitrite: NEGATIVE
PH: 6 (ref 5.0–8.0)
PROTEIN: NEGATIVE mg/dL
SPECIFIC GRAVITY, URINE: 1.014 (ref 1.005–1.030)
Urobilinogen, UA: 1 mg/dL (ref 0.0–1.0)

## 2014-06-27 LAB — CBC WITH DIFFERENTIAL/PLATELET
BASOS PCT: 1 % (ref 0–1)
Basophils Absolute: 0 10*3/uL (ref 0.0–0.1)
EOS ABS: 0.3 10*3/uL (ref 0.0–0.7)
Eosinophils Relative: 7 % — ABNORMAL HIGH (ref 0–5)
HCT: 31.6 % — ABNORMAL LOW (ref 36.0–46.0)
Hemoglobin: 10.6 g/dL — ABNORMAL LOW (ref 12.0–15.0)
Lymphocytes Relative: 28 % (ref 12–46)
Lymphs Abs: 1.3 10*3/uL (ref 0.7–4.0)
MCH: 27.2 pg (ref 26.0–34.0)
MCHC: 33.5 g/dL (ref 30.0–36.0)
MCV: 81 fL (ref 78.0–100.0)
Monocytes Absolute: 0.5 10*3/uL (ref 0.1–1.0)
Monocytes Relative: 11 % (ref 3–12)
NEUTROS PCT: 53 % (ref 43–77)
Neutro Abs: 2.6 10*3/uL (ref 1.7–7.7)
PLATELETS: 227 10*3/uL (ref 150–400)
RBC: 3.9 MIL/uL (ref 3.87–5.11)
RDW: 15.1 % (ref 11.5–15.5)
WBC: 4.8 10*3/uL (ref 4.0–10.5)

## 2014-06-27 LAB — COMPREHENSIVE METABOLIC PANEL
ALBUMIN: 3.7 g/dL (ref 3.5–5.2)
ALT: 15 U/L (ref 0–35)
AST: 30 U/L (ref 0–37)
Alkaline Phosphatase: 114 U/L (ref 39–117)
Anion gap: 6 (ref 5–15)
BILIRUBIN TOTAL: 0.6 mg/dL (ref 0.3–1.2)
BUN: 11 mg/dL (ref 6–23)
CO2: 30 mmol/L (ref 19–32)
Calcium: 8.7 mg/dL (ref 8.4–10.5)
Chloride: 104 mEq/L (ref 96–112)
Creatinine, Ser: 1.1 mg/dL (ref 0.50–1.10)
GFR calc Af Amer: 66 mL/min — ABNORMAL LOW (ref >90)
GFR calc non Af Amer: 57 mL/min — ABNORMAL LOW (ref >90)
Glucose, Bld: 103 mg/dL — ABNORMAL HIGH (ref 70–99)
POTASSIUM: 3.7 mmol/L (ref 3.5–5.1)
SODIUM: 140 mmol/L (ref 135–145)
TOTAL PROTEIN: 7 g/dL (ref 6.0–8.3)

## 2014-06-27 LAB — RAPID URINE DRUG SCREEN, HOSP PERFORMED
AMPHETAMINES: NOT DETECTED
Barbiturates: NOT DETECTED
Benzodiazepines: NOT DETECTED
Cocaine: NOT DETECTED
OPIATES: NOT DETECTED
Tetrahydrocannabinol: POSITIVE — AB

## 2014-06-27 LAB — ETHANOL: Alcohol, Ethyl (B): <5 mg/dL (ref 0–9)

## 2014-06-27 LAB — LIPASE, BLOOD: Lipase: 20 U/L (ref 11–59)

## 2014-06-27 MED ORDER — ZINC OXIDE 40 % EX OINT
TOPICAL_OINTMENT | Freq: Once | CUTANEOUS | Status: AC
  Administered 2014-06-27: 22:00:00 via TOPICAL
  Filled 2014-06-27: qty 114

## 2014-06-27 MED ORDER — OXYBUTYNIN CHLORIDE 5 MG PO TABS: 5.0000 mg | ORAL_TABLET | Freq: Three times a day (TID) | ORAL | Status: DC | PRN

## 2014-06-27 NOTE — ED Notes (Signed)
Bed: WA08 Expected date:  Expected time:  Means of arrival:  Comments: 76 F uti, abd pain, cath in place

## 2014-06-27 NOTE — Discharge Instructions (Signed)

## 2014-06-27 NOTE — ED Notes (Signed)
Pt sts that she is not really suicidal and does not want to harm herself.

## 2014-06-27 NOTE — ED Notes (Signed)
Awake. Verbally responsive. A/O x4. Resp even and unlabored. No audible adventitious breath sounds noted. ABC's intact. NAD noted. 

## 2014-06-27 NOTE — ED Provider Notes (Signed)
CSN: [id number]     Arrival date & time 06/27/14  1606 History   First MD Initiated Contact with Patient 06/27/14 1619     CC: abd pain HPI Pt was diagnosed with a UTI a few weeks ago.  She has been taking abx.   She doesn't feel tlike it has gotten better.  In the last few days she has noticed a foul odor to the urine.  She has also been having episodes of urine leaking outside the cather.  She has an indwelling foley catheter for the last 3-4 weeks because of urinary retention.  Pt has MS and also has noticed that her movements have been more difficult.  This often happens when she has a uti.  No fevers.  No vomiting or diarrhea.   She also has burning and discomfort in the vaginal area assoicated with a yeast infection.  This has been a chronic issue whenever she uses abx.  She thinks she might need to be in a nursing home.  She is having trouble caring for herself at home.  She lives alone.  Pt did not say anything to me but she told the nurse she has had suicidal ideations as well.  She is thinking of taking pills. Past Medical History  Diagnosis Date  . MS (multiple sclerosis)     a. Dx'd late 20's. b. Tx with Novantrone, Tysabri, Copaxone previously.  Marland Kitchen Hypertension   . Depression   . Glaucoma   . Cardiomyopathy     a.  Echo 04/29/12: Mild LVH, EF 20-25%, mild AI, moderate MR, moderate LAE, mild RAE, mild RVE, moderate TR, PASP 51, small pericardial effusion;   b. probably non-ischemic given multiple chemo-Tx agents used for MS and global LV dysfn on echo  . Chronic systolic heart failure   . CHF (congestive heart failure)   . Shortness of breath   . Neuromuscular disorder     MS   Past Surgical History  Procedure Laterality Date  . Ablation      uterine  . Cesarean section     Family History  Problem Relation Age of Onset  . Hypertension Mother   . Heart attack Neg Hx    History  Substance Use Topics  . Smoking status: Former Research scientist (life sciences)  . Smokeless tobacco: Never Used  .  Alcohol Use: No   OB History    No data available     Review of Systems  All other systems reviewed and are negative.     Allergies  Sulfonamide derivatives and Penicillins  Home Medications   Prior to Admission medications   Medication Sig Start Date End Date Taking? Authorizing Provider  amantadine (SYMMETREL) 100 MG capsule Take 100 mg by mouth 2 (two) times daily.    Yes Historical Provider, MD  baclofen (LIORESAL) 10 MG tablet Take 1 tablet by mouth daily. 05/18/14  Yes Historical Provider, MD  carvedilol (COREG) 25 MG tablet Take 1 tablet (25 mg total) by mouth 2 (two) times daily with a meal. 02/22/14  Yes Verlee Monte, MD  Cholecalciferol (VITAMIN D-3 PO) Take 1 tablet by mouth daily.   Yes Historical Provider, MD  Cyanocobalamin (VITAMIN B-12 PO) Take 1 tablet by mouth daily.   Yes Historical Provider, MD  DULoxetine (CYMBALTA) 30 MG capsule Take 1 capsule by mouth daily. 04/07/14  Yes Historical Provider, MD  furosemide (LASIX) 80 MG tablet Take 1 tablet (80 mg total) by mouth 2 (two) times daily. Take 160 mg every morning  and 80 mg po q HS 02/22/14  Yes Verlee Monte, MD  gabapentin (NEURONTIN) 600 MG tablet Take 1 tablet (600 mg total) by mouth 3 (three) times daily. 02/22/14  Yes Verlee Monte, MD  imipramine (TOFRANIL) 50 MG tablet Take 50 mg by mouth every evening.    Yes Historical Provider, MD  latanoprost (XALATAN) 0.005 % ophthalmic solution Place 1 drop into both eyes at bedtime.   Yes Historical Provider, MD  Multiple Vitamin (MULTIVITAMIN WITH MINERALS) TABS tablet Take 1 tablet by mouth daily.   Yes Historical Provider, MD  potassium chloride SA (K-DUR,KLOR-CON) 20 MEQ tablet Take 2 tablets (40 mEq total) by mouth daily. 02/10/14  Yes Liliane Shi, PA-C  solifenacin (VESICARE) 10 MG tablet Take 10 mg by mouth 2 (two) times daily.    Yes Historical Provider, MD  spironolactone (ALDACTONE) 25 MG tablet Take 25 mg by mouth daily.   Yes Historical Provider, MD  zolpidem  (AMBIEN) 5 MG tablet Take 1 tablet (5 mg total) by mouth at bedtime as needed for sleep. 02/23/14  Yes Estill Dooms, MD  ciprofloxacin (CIPRO) 500 MG tablet Take 1 tablet (500 mg total) by mouth 2 (two) times daily. Patient not taking: Reported on 06/27/2014 05/29/14   Johnna Acosta, MD  HYDROcodone-acetaminophen (NORCO/VICODIN) 5-325 MG per tablet Take 2 tablets by mouth every 4 (four) hours as needed. Patient not taking: Reported on 06/27/2014 05/29/14   Johnna Acosta, MD  oxyCODONE-acetaminophen (PERCOCET/ROXICET) 5-325 MG per tablet Take 1 tablet by mouth every 4 (four) hours as needed for moderate pain. Patient not taking: Reported on 06/27/2014 02/23/14   Estill Dooms, MD   BP 136/74 mmHg  Pulse 97  Temp(Src) 98.2 F (36.8 C) (Oral)  Resp 18  SpO2 100% Physical Exam  Constitutional: No distress.  HENT:  Head: Normocephalic and atraumatic.  Right Ear: External ear normal.  Left Ear: External ear normal.  Eyes: Conjunctivae are normal. Right eye exhibits no discharge. Left eye exhibits no discharge. No scleral icterus.  Neck: Neck supple. No tracheal deviation present.  Cardiovascular: Normal rate, regular rhythm and intact distal pulses.   Pulmonary/Chest: Effort normal and breath sounds normal. No stridor. No respiratory distress. She has no wheezes. She has no rales.  Abdominal: Soft. Bowel sounds are normal. She exhibits no distension. There is tenderness in the suprapubic area. There is no rigidity, no rebound, no guarding and no CVA tenderness. No hernia.  Genitourinary:  Indwelling Foley catheter, no rash noted, the tissue in the inner thigh region is moist and wet  Musculoskeletal: She exhibits edema (edema of extremities). She exhibits no tenderness.  Neurological: She is alert. No cranial nerve deficit (no facial droop, extraocular movements intact, no slurred speech) or sensory deficit. She exhibits normal muscle tone. She displays no seizure activity. Coordination  normal.  Difficulty lifting legs off the bed, sensation intact  Skin: Skin is warm and dry. No rash noted. She is not diaphoretic.  Psychiatric: She is withdrawn. She exhibits a depressed mood. She expresses suicidal ideation.  Nursing note and vitals reviewed.   ED Course  Procedures (including critical care time) Labs Review Labs Reviewed  CBC WITH DIFFERENTIAL - Abnormal; Notable for the following:    Hemoglobin 10.6 (*)    HCT 31.6 (*)    Eosinophils Relative 7 (*)    All other components within normal limits  COMPREHENSIVE METABOLIC PANEL - Abnormal; Notable for the following:    Glucose, Bld 103 (*)  GFR calc non Af Amer 57 (*)    GFR calc Af Amer 66 (*)    All other components within normal limits  URINE RAPID DRUG SCREEN (HOSP PERFORMED) - Abnormal; Notable for the following:    Tetrahydrocannabinol POSITIVE (*)    All other components within normal limits  URINALYSIS, ROUTINE W REFLEX MICROSCOPIC - Abnormal; Notable for the following:    Leukocytes, UA TRACE (*)    All other components within normal limits  URINE CULTURE  LIPASE, BLOOD  ETHANOL  URINE MICROSCOPIC-ADD ON      MDM   Final diagnoses:  MS (multiple sclerosis)  Complication of Foley catheter, initial encounter    The patient's laboratory tests are reassuring. Urinalysis does not suggest urinary tract infection. Foley catheter was changed and the patient now has clear urine. Does not appear to be leaking.  I think the patient's irritation to her skin is primarily related to the moisture in the area. She does not wear any underwear she says. Recommended a barrier cream but did not think she has a fungal infection at this time. The patient is scheduled to see a urologist for a possible suprapubic catheter. This may help her with her hygiene.  The patient did speak to the case manager. Arrangements were made for some home health assistance. He is feeling much better after that. I asked her about her  thoughts of suicide. Patient states she does not feel that way now. She is much more hopeful after being offered assistance at home and after her treatment in the emergency department. She was very stressed about her healthcare issues prior to coming into the emergency department.    Dorie Rank, MD 06/27/14 2102

## 2014-06-27 NOTE — ED Notes (Signed)
Care Management called and sts that she has arranged home health for pt that will start tomorrow.

## 2014-06-27 NOTE — ED Notes (Signed)
Awake. Verbally responsive. A/O x4. Resp even and unlabored. No audible adventitious breath sounds noted. ABC's intact. F/C draining clear yellow urine without visible sediment noted.

## 2014-06-27 NOTE — ED Notes (Signed)
Awake. Verbally responsive. A/O x4. Resp even and unlabored. No audible adventitious breath sounds noted. ABC's intact. F/C patent and intact. 

## 2014-06-27 NOTE — ED Notes (Signed)
Dr Tomi Bamberger and Charge RN made aware that pt expressed suicidal ideation

## 2014-06-27 NOTE — ED Notes (Addendum)
Pt from home c/o abd pain, possible UTI, and catheter issue cia EMS-Per EMS, pt had UTI a few weeks ago. Pt finished abx, but has noticed urinary odor and increased abd pain. Pt is A&O and in NAD. Pt adds that she has R lower extremity swelling. Pt has hx of MS and CHF

## 2014-06-27 NOTE — Progress Notes (Addendum)
  CARE MANAGEMENT ED NOTE 06/27/2014  Patient:  Shannon Garrett, Shannon Garrett   Account Number:  [id number]  Date Initiated:  06/27/2014  Documentation initiated by:  [facility]  Subjective/Objective Assessment:     Subjective/Objective Assessment Detail:   Patient presented to [facility] ED with c/o  a foul odor to the urine.  She has also been having episodes of urine leaking outside the cather.  She has an indwelling foley catheter for the last 3-4 weeks because of urinary retention.  Pt has MS and also has noticed that her movements have been more difficult.  This often happens when she has a uti.  No fevers.  No vomiting or diarrhea.        Action/Plan:   Referral for [facility] Services    Action/Plan Detail:   Anticipated DC Date:  06/27/2014     Status Recommendation to Physician:   Result of Recommendation:  Agreed  Other ED Services  Consult Working [place]  CM consult   [facility] Choice  HOME HEALTH   Choice offered to / List presented to:  [name]     [place] arranged  HH-1 RN  [place]      [place].    Status of service:  Completed, signed off  ED Comments:   ED Comments Detail:  ED CM received consult from [facility] ED concerning home health services reviewed patient's record, Patient has [facility] Medicare, Spoke with patient by phone. Patient reports Dr. Fredderick Phenix is her PCP and she has a  HHA that comes in 3 hours from [place] per day. She would like to get more hours. Discussed she would need to speak with her PCP for a referral to increase hours.  Patient states, she is not interested in placement.  Discussed HH services RN/PT/HHA/SW  recommmendation patient is agreeable with dsiposition plan. Offered choice, selceted [facility]. verified address, patient states that she will need to contact [place] in the am with her updated phone number Referral faxed via Epic. Informed patient that she will be contacted by [facility] within  24-48hrs. Patient verbalized understanding, and was appreciative. Updated Dr. Tomi Bamberger and Manuela Schwartz RN with disposition plan they are agreeable. No further ED CM needs identified.

## 2014-06-27 NOTE — ED Notes (Signed)
Notify communications to arrange for transport back home.

## 2014-06-27 NOTE — ED Notes (Signed)
Pt sts that she is here for abd pain and burning at catheter site. Pt expresses ideation of self harm. Pt sts that she has plan that if she would harm herself, she would take pills. Pt adds that it is hard for her to get pills out of the blister packs and sts that that is the only thing that keeps her safe. Pt in NAD

## 2014-06-29 LAB — URINE CULTURE: Colony Count: 40000

## 2014-07-16 ENCOUNTER — Encounter (HOSPITAL_COMMUNITY): Payer: Self-pay | Admitting: Emergency Medicine

## 2014-07-16 ENCOUNTER — Emergency Department (HOSPITAL_COMMUNITY)
Admission: EM | Admit: 2014-07-16 | Discharge: 2014-07-16 | Disposition: A | Discharge status: Home / Self Care | Payer: Medicare Other | Attending: Emergency Medicine | Admitting: Emergency Medicine

## 2014-07-16 DIAGNOSIS — H409 Unspecified glaucoma: Secondary | ICD-10-CM | POA: Diagnosis not present

## 2014-07-16 DIAGNOSIS — W19XXXA Unspecified fall, initial encounter: Secondary | ICD-10-CM

## 2014-07-16 DIAGNOSIS — Z8673 Personal history of transient ischemic attack (TIA), and cerebral infarction without residual deficits: Secondary | ICD-10-CM | POA: Diagnosis not present

## 2014-07-16 DIAGNOSIS — F329 Major depressive disorder, single episode, unspecified: Secondary | ICD-10-CM | POA: Insufficient documentation

## 2014-07-16 DIAGNOSIS — Y9289 Other specified places as the place of occurrence of the external cause: Secondary | ICD-10-CM | POA: Insufficient documentation

## 2014-07-16 DIAGNOSIS — Z880 Allergy status to penicillin: Secondary | ICD-10-CM | POA: Insufficient documentation

## 2014-07-16 DIAGNOSIS — W06XXXA Fall from bed, initial encounter: Secondary | ICD-10-CM | POA: Insufficient documentation

## 2014-07-16 DIAGNOSIS — Z87891 Personal history of nicotine dependence: Secondary | ICD-10-CM | POA: Diagnosis not present

## 2014-07-16 DIAGNOSIS — I5022 Chronic systolic (congestive) heart failure: Secondary | ICD-10-CM | POA: Diagnosis not present

## 2014-07-16 DIAGNOSIS — Y9389 Activity, other specified: Secondary | ICD-10-CM | POA: Insufficient documentation

## 2014-07-16 DIAGNOSIS — Z043 Encounter for examination and observation following other accident: Secondary | ICD-10-CM | POA: Insufficient documentation

## 2014-07-16 DIAGNOSIS — Z79899 Other long term (current) drug therapy: Secondary | ICD-10-CM | POA: Diagnosis not present

## 2014-07-16 DIAGNOSIS — I10 Essential (primary) hypertension: Secondary | ICD-10-CM | POA: Diagnosis not present

## 2014-07-16 DIAGNOSIS — Y998 Other external cause status: Secondary | ICD-10-CM | POA: Diagnosis not present

## 2014-07-16 LAB — CBC WITH DIFFERENTIAL/PLATELET
Basophils Absolute: 0 10*3/uL (ref 0.0–0.1)
Basophils Relative: 1 % (ref 0–1)
EOS ABS: 0.2 10*3/uL (ref 0.0–0.7)
Eosinophils Relative: 4 % (ref 0–5)
HEMATOCRIT: 34 % — AB (ref 36.0–46.0)
Hemoglobin: 11.6 g/dL — ABNORMAL LOW (ref 12.0–15.0)
Lymphocytes Relative: 28 % (ref 12–46)
Lymphs Abs: 1.5 10*3/uL (ref 0.7–4.0)
MCH: 27.6 pg (ref 26.0–34.0)
MCHC: 34.1 g/dL (ref 30.0–36.0)
MCV: 80.8 fL (ref 78.0–100.0)
MONOS PCT: 11 % (ref 3–12)
Monocytes Absolute: 0.6 10*3/uL (ref 0.1–1.0)
Neutro Abs: 3 10*3/uL (ref 1.7–7.7)
Neutrophils Relative %: 56 % (ref 43–77)
Platelets: 273 10*3/uL (ref 150–400)
RBC: 4.21 MIL/uL (ref 3.87–5.11)
RDW: 15.4 % (ref 11.5–15.5)
WBC: 5.3 10*3/uL (ref 4.0–10.5)

## 2014-07-16 LAB — BASIC METABOLIC PANEL
ANION GAP: 8 (ref 5–15)
BUN: 13 mg/dL (ref 6–23)
CO2: 31 mmol/L (ref 19–32)
Calcium: 8.5 mg/dL (ref 8.4–10.5)
Chloride: 101 mEq/L (ref 96–112)
Creatinine, Ser: 1.01 mg/dL (ref 0.50–1.10)
GFR calc non Af Amer: 63 mL/min — ABNORMAL LOW (ref >90)
GFR, EST AFRICAN AMERICAN: 73 mL/min — AB (ref >90)
Glucose, Bld: 86 mg/dL (ref 70–99)
Potassium: 3.9 mmol/L (ref 3.5–5.1)
Sodium: 140 mmol/L (ref 135–145)

## 2014-07-16 LAB — URINALYSIS, ROUTINE W REFLEX MICROSCOPIC
BILIRUBIN URINE: NEGATIVE
GLUCOSE, UA: NEGATIVE mg/dL
Hgb urine dipstick: NEGATIVE
Ketones, ur: NEGATIVE mg/dL
Nitrite: NEGATIVE
PH: 7 (ref 5.0–8.0)
PROTEIN: NEGATIVE mg/dL
SPECIFIC GRAVITY, URINE: 1.012 (ref 1.005–1.030)
Urobilinogen, UA: 1 mg/dL (ref 0.0–1.0)

## 2014-07-16 LAB — URINE MICROSCOPIC-ADD ON

## 2014-07-16 LAB — CK: Total CK: 376 U/L — ABNORMAL HIGH (ref 7–177)

## 2014-07-16 MED ORDER — ONDANSETRON 4 MG PO TBDP
4.0000 mg | ORAL_TABLET | Freq: Once | ORAL | Status: AC
  Administered 2014-07-16: 4 mg via ORAL
  Filled 2014-07-16: qty 1

## 2014-07-16 MED ORDER — HYDROCODONE-ACETAMINOPHEN 5-325 MG PO TABS
1.0000 | ORAL_TABLET | Freq: Once | ORAL | Status: AC
  Administered 2014-07-16: 1 via ORAL
  Filled 2014-07-16: qty 1

## 2014-07-16 MED ORDER — SODIUM CHLORIDE 0.9 % IV BOLUS (SEPSIS)
500.0000 mL | Freq: Once | INTRAVENOUS | Status: AC
  Administered 2014-07-16: 500 mL via INTRAVENOUS

## 2014-07-16 NOTE — ED Notes (Signed)
[facility] called for transport at this time 

## 2014-07-16 NOTE — ED Provider Notes (Signed)
CSN: [id number]     Arrival date & time 07/16/14  1600 History   First MD Initiated Contact with Patient 07/16/14 1601     Chief Complaint  Patient presents with  . Fall     (Consider location/radiation/quality/duration/timing/severity/associated sxs/prior Treatment) HPI   This is a 54 year old female who presents from home for evaluation of a fall. She has a past medical history of multiple sclerosis, CHF, hypertension. Patient was in her bed last night and rolled to grab her urine bag to change it. She states that it was heavy and she lost her balance, falling onto the floor. Patient is unsure how long she was on the floor. EMS estimates approximately 10 hours. Patient was scheduled to be taken to [facility] nursing [facility] today. The nursing home could not get in touch with the patient and came to the house and found her on the floor. She is unable to take any of her regular medications today. She is complaining of thirst and hunger. She has diffuse myalgia. She also complains of pain in her back from the position she was laying out. She is unable to move from her position as she has no use of her legs. She denies hitting her head or losing consciousness. Her fall was approximately 2 feet. The patient also has a chronic indwelling Foley catheter. She is unsure of the last time it was changed.   Past Medical History  Diagnosis Date  . MS (multiple sclerosis)     a. Dx'd late 20's. b. Tx with Novantrone, Tysabri, Copaxone previously.  Marland Kitchen Hypertension   . Depression   . Glaucoma   . Cardiomyopathy     a.  Echo 04/29/12: Mild LVH, EF 20-25%, mild AI, moderate MR, moderate LAE, mild RAE, mild RVE, moderate TR, PASP 51, small pericardial effusion;   b. probably non-ischemic given multiple chemo-Tx agents used for MS and global LV dysfn on echo  . Chronic systolic heart failure   . CHF (congestive heart failure)   . Shortness of breath   . Neuromuscular disorder     MS   Past Surgical  History  Procedure Laterality Date  . Ablation      uterine  . Cesarean section     Family History  Problem Relation Age of Onset  . Hypertension Mother   . Heart attack Neg Hx    History  Substance Use Topics  . Smoking status: Former Research scientist (life sciences)  . Smokeless tobacco: Never Used  . Alcohol Use: No   OB History    No data available     Review of Systems Ten systems reviewed and are negative for acute change, except as noted in the HPI.     Allergies  Sulfonamide derivatives and Penicillins  Home Medications   Prior to Admission medications   Medication Sig Start Date End Date Taking? Authorizing Provider  amantadine (SYMMETREL) 100 MG capsule Take 100 mg by mouth 2 (two) times daily.    Yes Historical Provider, MD  baclofen (LIORESAL) 10 MG tablet Take 1 tablet by mouth daily. 05/18/14  Yes Historical Provider, MD  carvedilol (COREG) 25 MG tablet Take 1 tablet (25 mg total) by mouth 2 (two) times daily with a meal. 02/22/14  Yes Verlee Monte, MD  Cholecalciferol (VITAMIN D-3 PO) Take 1 tablet by mouth daily.   Yes Historical Provider, MD  Cyanocobalamin (VITAMIN B-12 PO) Take 1 tablet by mouth daily.   Yes Historical Provider, MD  DULoxetine (CYMBALTA) 30 MG capsule Take  1 capsule by mouth daily. 04/07/14  Yes Historical Provider, MD  furosemide (LASIX) 80 MG tablet Take 1 tablet (80 mg total) by mouth 2 (two) times daily. Take 160 mg every morning and 80 mg po q HS 02/22/14  Yes Verlee Monte, MD  gabapentin (NEURONTIN) 600 MG tablet Take 1 tablet (600 mg total) by mouth 3 (three) times daily. 02/22/14  Yes Verlee Monte, MD  imipramine (TOFRANIL) 50 MG tablet Take 50 mg by mouth every evening.    Yes Historical Provider, MD  latanoprost (XALATAN) 0.005 % ophthalmic solution Place 1 drop into both eyes at bedtime.   Yes Historical Provider, MD  Multiple Vitamin (MULTIVITAMIN WITH MINERALS) TABS tablet Take 1 tablet by mouth daily.   Yes Historical Provider, MD  oxybutynin (DITROPAN)  5 MG tablet Take 1 tablet (5 mg total) by mouth every 8 (eight) hours as needed for bladder spasms. 06/27/14  Yes Dorie Rank, MD  potassium chloride SA (K-DUR,KLOR-CON) 20 MEQ tablet Take 2 tablets (40 mEq total) by mouth daily. 02/10/14  Yes Liliane Shi, PA-C  solifenacin (VESICARE) 10 MG tablet Take 10 mg by mouth 2 (two) times daily.    Yes Historical Provider, MD  spironolactone (ALDACTONE) 25 MG tablet Take 25 mg by mouth daily.   Yes Historical Provider, MD  zolpidem (AMBIEN) 5 MG tablet Take 1 tablet (5 mg total) by mouth at bedtime as needed for sleep. 02/23/14  Yes Estill Dooms, MD   BP 137/74 mmHg  Pulse 91  Temp(Src) 98.8 F (37.1 C) (Oral)  Resp 18  SpO2 98% Physical Exam  Constitutional: She is oriented to person, place, and time. She appears well-developed and well-nourished. No distress.  HENT:  Head: Normocephalic and atraumatic.  Eyes: Conjunctivae are normal. No scleral icterus.  Very dry oral mucosa  Neck: Normal range of motion.  Cardiovascular: Normal rate, regular rhythm and normal heart sounds.  Exam reveals no gallop and no friction rub.   No murmur heard. Pulmonary/Chest: Effort normal and breath sounds normal. No respiratory distress.  Abdominal: Soft. Bowel sounds are normal. She exhibits no distension and no mass. There is no tenderness. There is no guarding.  Neurological: She is alert and oriented to person, place, and time.  Skin: Skin is warm and dry. She is not diaphoretic.  Nursing note and vitals reviewed.   ED Course  Procedures (including critical care time) Labs Review Labs Reviewed  URINALYSIS, ROUTINE W REFLEX MICROSCOPIC - Abnormal; Notable for the following:    Leukocytes, UA SMALL (*)    All other components within normal limits  CBC WITH DIFFERENTIAL - Abnormal; Notable for the following:    Hemoglobin 11.6 (*)    HCT 34.0 (*)    All other components within normal limits  BASIC METABOLIC PANEL - Abnormal; Notable for the following:     GFR calc non Af Amer 63 (*)    GFR calc Af Amer 73 (*)    All other components within normal limits  CK - Abnormal; Notable for the following:    Total CK 376 (*)    All other components within normal limits  URINE MICROSCOPIC-ADD ON    Imaging Review No results found.   EKG Interpretation None      MDM   Final diagnoses:  Fall, initial encounter    Labs without acute abnormality.  No concern for Rhabdomyolysis. The patient will be discharged to her Nursing home. She appers safe for discharge at this time.  Margarita Mail, PA-C 07/18/14 5189  Pamella Pert, MD 07/18/14 1357

## 2014-07-16 NOTE — Discharge Instructions (Signed)
Your lab work was negative for any abnormalities. He appears safe for discharge to a nursing facility. Fall Prevention and Home Safety Falls cause injuries and can affect all age groups. It is possible to use preventive measures to significantly decrease the likelihood of falls. There are many simple measures which can make your home safer and prevent falls. OUTDOORS  Repair cracks and edges of walkways and driveways.  Remove high doorway thresholds.  Trim shrubbery on the main path into your home.  Have good outside lighting.  Clear walkways of tools, rocks, debris, and clutter.  Check that handrails are not broken and are securely fastened. Both sides of steps should have handrails.  Have leaves, snow, and ice cleared regularly.  Use sand or salt on walkways during winter months.  In the garage, clean up grease or oil spills. BATHROOM  Install night lights.  Install grab bars by the toilet and in the tub and shower.  Use non-skid mats or decals in the tub or shower.  Place a plastic non-slip stool in the shower to sit on, if needed.  Keep floors dry and clean up all water on the floor immediately.  Remove soap buildup in the tub or shower on a regular basis.  Secure bath mats with non-slip, double-sided rug tape.  Remove throw rugs and tripping hazards from the floors. BEDROOMS  Install night lights.  Make sure a bedside light is easy to reach.  Do not use oversized bedding.  Keep a telephone by your bedside.  Have a firm chair with side arms to use for getting dressed.  Remove throw rugs and tripping hazards from the floor. KITCHEN  Keep handles on pots and pans turned toward the center of the stove. Use back burners when possible.  Clean up spills quickly and allow time for drying.  Avoid walking on wet floors.  Avoid hot utensils and knives.  Position shelves so they are not too high or low.  Place commonly used objects within easy reach.  If  necessary, use a sturdy step stool with a grab bar when reaching.  Keep electrical cables out of the way.  Do not use floor polish or wax that makes floors slippery. If you must use wax, use non-skid floor wax.  Remove throw rugs and tripping hazards from the floor. STAIRWAYS  Never leave objects on stairs.  Place handrails on both sides of stairways and use them. Fix any loose handrails. Make sure handrails on both sides of the stairways are as long as the stairs.  Check carpeting to make sure it is firmly attached along stairs. Make repairs to worn or loose carpet promptly.  Avoid placing throw rugs at the top or bottom of stairways, or properly secure the rug with carpet tape to prevent slippage. Get rid of throw rugs, if possible.  Have an electrician put in a light switch at the top and bottom of the stairs. OTHER FALL PREVENTION TIPS  Wear low-heel or rubber-soled shoes that are supportive and fit well. Wear closed toe shoes.  When using a stepladder, make sure it is fully opened and both spreaders are firmly locked. Do not climb a closed stepladder.  Add color or contrast paint or tape to grab bars and handrails in your home. Place contrasting color strips on first and last steps.  Learn and use mobility aids as needed. Install an electrical emergency response system.  Turn on lights to avoid dark areas. Replace light bulbs that burn out immediately. Get  light switches that glow.  Arrange furniture to create clear pathways. Keep furniture in the same place.  Firmly attach carpet with non-skid or double-sided tape.  Eliminate uneven floor surfaces.  Select a carpet pattern that does not visually hide the edge of steps.  Be aware of all pets. OTHER HOME SAFETY TIPS  Set the water temperature for 120 F (48.8 C).  Keep emergency numbers on or near the telephone.  Keep smoke detectors on every level of the home and near sleeping areas. Document Released: 06/08/2002  Document Revised: 12/18/2011 Document Reviewed: 09/07/2011 [facility] Patient Information 2015 [place], [place]. This information is not intended to replace advice given to you by your health care provider. Make sure you discuss any questions you have with your health care provider.

## 2014-07-16 NOTE — ED Notes (Signed)
Bed: IP77 Expected date:  Expected time:  Means of arrival:  Comments: Ems- fall elderly

## 2014-07-16 NOTE — ED Notes (Signed)
[place], SW has spoken with [facility] and is still wait to hear back if pt can be transferred to their facility tonight.

## 2014-07-16 NOTE — ED Notes (Signed)
Per EMS comes from home for fall. Pt was on ground for unknown hours, just knows it was dark outside when she fell.  Pt c/o back pain. Pt was scheduled to go to [facility] ([place], [place]) yesterday but didn't.  Pt's home health nurse was the one that was trying to contact her today about transferring her to the assisted living facility but wasn't able to get pt on the phone.  Pt was wanting to on to facility but RN stated that pt had to come to ED for clearance before she could go to facility after the fall.  Pt nurse name, Genevie Cheshire, [phone].

## 2014-07-16 NOTE — ED Notes (Signed)
Gave pt [place] sandwich and diet ginger ale.

## 2014-07-16 NOTE — Progress Notes (Signed)
CSW was notified by nurse that the location where patient would return to upon discharge was not certain. CSW reached out to [facility] and spoke with Lynelle Smoke, who is a Dentist for [employer]. CSW also reached out to [facility] (Admission Mudlogger). Both representatives from [facility] state that the patient is welcomed to their facility upon discharge.  CSW ensured that nurse was given a hard copy of discharge paperwork with a medication list that was signed by doctor.  Per Admission Director/Latasha, she found the patient at home on the floor this week. Admission director informed CSW that the patient has been demonstrating unsafe behaviors and believes the patient would not be safe at home alone. Also, she states that the patient does not have family that would be able to care for her at this time id discharged home. She says the patient has a sister. However, sister is elderly and would not be able to assist patient.  CSW consulted with nurse, who states that she has called [facility]. Patient is ready for discharge. Patient will be transported to the following [facility] location tonight : [place], [place].  [facility] Living Admission Director/Latasha Hunter [phone] [place] [phone]  Willette Brace [phone] ED CSW 07/16/2014 9:07 PM

## 2014-07-16 NOTE — ED Notes (Addendum)
Brittney with SW is looking into pt's case and helping to set up discharge arrangements

## 2014-07-16 NOTE — ED Notes (Signed)
Call placed to Genevie Cheshire at [phone]. No answer. Voicemail left for call back asap.

## 2014-07-19 ENCOUNTER — Encounter: Payer: Self-pay | Admitting: Internal Medicine

## 2014-07-19 ENCOUNTER — Non-Acute Institutional Stay (SKILLED_NURSING_FACILITY): Payer: Medicare Other | Admitting: Internal Medicine

## 2014-07-19 DIAGNOSIS — I5042 Chronic combined systolic (congestive) and diastolic (congestive) heart failure: Secondary | ICD-10-CM

## 2014-07-19 DIAGNOSIS — G894 Chronic pain syndrome: Secondary | ICD-10-CM

## 2014-07-19 DIAGNOSIS — I10 Essential (primary) hypertension: Secondary | ICD-10-CM

## 2014-07-19 DIAGNOSIS — R531 Weakness: Secondary | ICD-10-CM

## 2014-07-19 DIAGNOSIS — N319 Neuromuscular dysfunction of bladder, unspecified: Secondary | ICD-10-CM

## 2014-07-19 DIAGNOSIS — G35 Multiple sclerosis: Secondary | ICD-10-CM

## 2014-07-19 NOTE — Progress Notes (Signed)
Patient ID: Shannon Garrett, female   DOB: 1961-06-15, 54 y.o.   MRN: [id number]    HISTORY AND PHYSICAL  Location:  [place] [facility] Service: SNF [phone])   Extended Emergency Contact Information Primary Emergency Contact: [place], [place] [address] [place] Phone: [phone] Relation: Sister Secondary Emergency Contact: Jefm Bryant, [place] Phone: [phone] Relation: Friend  Advanced Directive information  Full Code  Chief Complaint  Patient presents with  . New Admit To SNF    deconditioning, MS, chronic pain syndrome, MDD, insomnia, recurrent falls, hx CHF with cardiomyopathy, HTN, edema, OAB, glaucoma    HPI:   54 yo female seen today as a new admission to SNF for above. Her pain is uncontrolled. The pain is sharp intermittent in her bladder and is 7-8/10 on scale currently. She has a chronic foley x 1 yr but hopes to have a suprapubic cath placed soon. She is on a 1800 fluid restriction due to CHF. No other concerns. No nursing issues. Appetite is okay. She has chronic weakness due to MS but was able to transfer from bed-chair without assistance prior to her hospitalization. She would like to return to her baseline functional status  Past Medical History  Diagnosis Date  . MS (multiple sclerosis)     a. Dx'd late 20's. b. Tx with Novantrone, Tysabri, Copaxone previously.  Marland Kitchen Hypertension   . Depression   . Glaucoma   . Cardiomyopathy     a.  Echo 04/29/12: Mild LVH, EF 20-25%, mild AI, moderate MR, moderate LAE, mild RAE, mild RVE, moderate TR, PASP 51, small pericardial effusion;   b. probably non-ischemic given multiple chemo-Tx agents used for MS and global LV dysfn on echo  . Chronic systolic heart failure   . CHF (congestive heart failure)   . Shortness of breath   . Neuromuscular disorder     MS    Past Surgical History  Procedure Laterality Date  .  Ablation      uterine  . Cesarean section      Patient Care Team: Reymundo Poll, MD as PCP - General (Family Medicine) Jacky Kindle, MD as Referring Physician (Neurology)  History   Social History  . Marital Status: Single    Spouse Name: N/A    Number of Children: N/A  . Years of Education: N/A   Occupational History  . Not on file.   Social History Main Topics  . Smoking status: Former Research scientist (life sciences)  . Smokeless tobacco: Never Used  . Alcohol Use: No  . Drug Use: No  . Sexual Activity: Not Currently   Other Topics Concern  . Not on file   Social History Narrative     reports that she has quit smoking. She has never used smokeless tobacco. She reports that she does not drink alcohol or use illicit drugs.  Family History  Problem Relation Age of Onset  . Hypertension Mother   . Heart attack Neg Hx    Family Status  Relation Status Death Age  . Mother Alive   . Father Deceased     Immunization History  Administered Date(s) Administered  . Influenza,inj,Quad PF,36+ Mos 08/01/2013  . Pneumococcal Polysaccharide-23 08/01/2013    Allergies  Allergen Reactions  . Sulfonamide Derivatives Hives and Shortness Of Breath  . Penicillins Hives and Itching  Medications: Patient's Medications  New Prescriptions   No medications on file  Previous Medications   AMANTADINE (SYMMETREL) 100 MG CAPSULE    Take 100 mg by mouth 2 (two) times daily.    BACLOFEN (LIORESAL) 10 MG TABLET    Take 1 tablet by mouth daily.   CARVEDILOL (COREG) 25 MG TABLET    Take 1 tablet (25 mg total) by mouth 2 (two) times daily with a meal.   CHOLECALCIFEROL (VITAMIN D-3 PO)    Take 1 tablet by mouth daily.   CYANOCOBALAMIN (VITAMIN B-12 PO)    Take 1 tablet by mouth daily.   DULOXETINE (CYMBALTA) 30 MG CAPSULE    Take 1 capsule by mouth daily.   FUROSEMIDE (LASIX) 80 MG TABLET    Take 1 tablet (80 mg total) by mouth 2 (two) times daily. Take 160 mg every morning and 80 mg po q HS    GABAPENTIN (NEURONTIN) 600 MG TABLET    Take 1 tablet (600 mg total) by mouth 3 (three) times daily.   IMIPRAMINE (TOFRANIL) 50 MG TABLET    Take 50 mg by mouth every evening.    LATANOPROST (XALATAN) 0.005 % OPHTHALMIC SOLUTION    Place 1 drop into both eyes at bedtime.   MULTIPLE VITAMIN (MULTIVITAMIN WITH MINERALS) TABS TABLET    Take 1 tablet by mouth daily.   OXYBUTYNIN (DITROPAN) 5 MG TABLET    Take 1 tablet (5 mg total) by mouth every 8 (eight) hours as needed for bladder spasms.   POTASSIUM CHLORIDE SA (K-DUR,KLOR-CON) 20 MEQ TABLET    Take 2 tablets (40 mEq total) by mouth daily.   SOLIFENACIN (VESICARE) 10 MG TABLET    Take 10 mg by mouth 2 (two) times daily.    SPIRONOLACTONE (ALDACTONE) 25 MG TABLET    Take 25 mg by mouth daily.   ZOLPIDEM (AMBIEN) 5 MG TABLET    Take 1 tablet (5 mg total) by mouth at bedtime as needed for sleep.  Modified Medications   No medications on file  Discontinued Medications   No medications on file   **Past medical, surgical, family and social history reviewed   Review of Systems As above. All other systems reviewed are negative.   Filed Vitals:   07/19/14 1644  BP: 120/60  Pulse: 80  Temp: 97.8 F (36.6 C)  Weight: 203 lb (92.08 kg)  SpO2: 98%   Body mass index is 34.83 kg/(m^2).  Physical Exam CONSTITUTIONAL: Looks frail in NAD. Awake, alert and oriented x 3. Lying in bed HEENT: PERRLA. No scleral icterus. Oropharynx clear and without exudate. MMdry NECK: Supple. Nontender. No palpable cervical or supraclavicular lymph nodes. No carotid bruit b/l.   CVS: Regular rate without murmur, gallop or rub. LUNGS: CTA b/l no wheezing, rales or rhonchi. ABDOMEN: Bowel sounds present x 4. Soft, nondistended. No palpable mass or bruit. (+) suprapubic TTP but no r/g/r. EXTREMITIES: Trace LE edema b/l. Distal pulses palpable. No calf tenderness PSYCH: Affect, behavior and mood normal GU: foley cath intact and yellow clear urine DTG    Labs  reviewed: Admission on 07/16/2014, Discharged on 07/16/2014  Component Date Value Ref Range Status  . Color, Urine 07/16/2014 YELLOW  YELLOW Final  . APPearance 07/16/2014 CLEAR  CLEAR Final  . Specific Gravity, Urine 07/16/2014 1.012  1.005 - 1.030 Final  . pH 07/16/2014 7.0  5.0 - 8.0 Final  . Glucose, UA 07/16/2014 NEGATIVE  NEGATIVE mg/dL Final  . Hgb urine dipstick 07/16/2014 NEGATIVE  NEGATIVE Final  . Bilirubin Urine 07/16/2014 NEGATIVE  NEGATIVE Final  . Ketones, ur 07/16/2014 NEGATIVE  NEGATIVE mg/dL Final  . Protein, ur 18/79/9117 NEGATIVE  NEGATIVE mg/dL Final  . Urobilinogen, UA 07/16/2014 1.0  0.0 - 1.0 mg/dL Final  . Nitrite 99/40/5003 NEGATIVE  NEGATIVE Final  . Leukocytes, UA 07/16/2014 SMALL* NEGATIVE Final  . WBC 07/16/2014 5.3  4.0 - 10.5 K/uL Final  . RBC 07/16/2014 4.21  3.87 - 5.11 MIL/uL Final  . Hemoglobin 07/16/2014 11.6* 12.0 - 15.0 g/dL Final  . HCT 60/05/2685 34.0* 36.0 - 46.0 % Final  . MCV 07/16/2014 80.8  78.0 - 100.0 fL Final  . MCH 07/16/2014 27.6  26.0 - 34.0 pg Final  . MCHC 07/16/2014 34.1  30.0 - 36.0 g/dL Final  . RDW 48/59/8488 15.4  11.5 - 15.5 % Final  . Platelets 07/16/2014 273  150 - 400 K/uL Final  . Neutrophils Relative % 07/16/2014 56  43 - 77 % Final  . Neutro Abs 07/16/2014 3.0  1.7 - 7.7 K/uL Final  . Lymphocytes Relative 07/16/2014 28  12 - 46 % Final  . Lymphs Abs 07/16/2014 1.5  0.7 - 4.0 K/uL Final  . Monocytes Relative 07/16/2014 11  3 - 12 % Final  . Monocytes Absolute 07/16/2014 0.6  0.1 - 1.0 K/uL Final  . Eosinophils Relative 07/16/2014 4  0 - 5 % Final  . Eosinophils Absolute 07/16/2014 0.2  0.0 - 0.7 K/uL Final  . Basophils Relative 07/16/2014 1  0 - 1 % Final  . Basophils Absolute 07/16/2014 0.0  0.0 - 0.1 K/uL Final  . Sodium 07/16/2014 140  135 - 145 mmol/L Final   Please note change in reference range.  . Potassium 07/16/2014 3.9  3.5 - 5.1 mmol/L Final   Please note change in reference range.  . Chloride 07/16/2014  101  96 - 112 mEq/L Final  . CO2 07/16/2014 31  19 - 32 mmol/L Final  . Glucose, Bld 07/16/2014 86  70 - 99 mg/dL Final  . BUN 58/41/8672 13  6 - 23 mg/dL Final  . Creatinine, Ser 07/16/2014 1.01  0.50 - 1.10 mg/dL Final  . Calcium 92/67/2820 8.5  8.4 - 10.5 mg/dL Final  . GFR calc non Af Amer 07/16/2014 63* >90 mL/min Final  . GFR calc Af Amer 07/16/2014 73* >90 mL/min Final   Comment: (NOTE) The eGFR has been calculated using the CKD EPI equation. This calculation has not been validated in all clinical situations. eGFR's persistently <90 mL/min signify possible Chronic Kidney Disease.   . Anion gap 07/16/2014 8  5 - 15 Final  . Total CK 07/16/2014 376* 7 - 177 U/L Final  . Squamous Epithelial / LPF 07/16/2014 RARE  RARE Final  . WBC, UA 07/16/2014 3-6  <3 WBC/hpf Final  Admission on 06/27/2014, Discharged on 06/27/2014  Component Date Value Ref Range Status  . WBC 06/27/2014 4.8  4.0 - 10.5 K/uL Final  . RBC 06/27/2014 3.90  3.87 - 5.11 MIL/uL Final  . Hemoglobin 06/27/2014 10.6* 12.0 - 15.0 g/dL Final  . HCT 54/04/9272 31.6* 36.0 - 46.0 % Final  . MCV 06/27/2014 81.0  78.0 - 100.0 fL Final  . MCH 06/27/2014 27.2  26.0 - 34.0 pg Final  . MCHC 06/27/2014 33.5  30.0 - 36.0 g/dL Final  . RDW 31/12/8809 15.1  11.5 - 15.5 % Final  . Platelets 06/27/2014 227  150 - 400 K/uL Final  . Neutrophils Relative %  06/27/2014 53  43 - 77 % Final  . Neutro Abs 06/27/2014 2.6  1.7 - 7.7 K/uL Final  . Lymphocytes Relative 06/27/2014 28  12 - 46 % Final  . Lymphs Abs 06/27/2014 1.3  0.7 - 4.0 K/uL Final  . Monocytes Relative 06/27/2014 11  3 - 12 % Final  . Monocytes Absolute 06/27/2014 0.5  0.1 - 1.0 K/uL Final  . Eosinophils Relative 06/27/2014 7* 0 - 5 % Final  . Eosinophils Absolute 06/27/2014 0.3  0.0 - 0.7 K/uL Final  . Basophils Relative 06/27/2014 1  0 - 1 % Final  . Basophils Absolute 06/27/2014 0.0  0.0 - 0.1 K/uL Final  . Sodium 06/27/2014 140  135 - 145 mmol/L Final   Please note  change in reference range.  . Potassium 06/27/2014 3.7  3.5 - 5.1 mmol/L Final   Comment: Please note change in reference range. DELTA CHECK NOTED   . Chloride 06/27/2014 104  96 - 112 mEq/L Final  . CO2 06/27/2014 30  19 - 32 mmol/L Final  . Glucose, Bld 06/27/2014 103* 70 - 99 mg/dL Final  . BUN 07/37/1062 11  6 - 23 mg/dL Final  . Creatinine, Ser 06/27/2014 1.10  0.50 - 1.10 mg/dL Final  . Calcium 69/48/5462 8.7  8.4 - 10.5 mg/dL Final  . Total Protein 06/27/2014 7.0  6.0 - 8.3 g/dL Final  . Albumin 70/35/0093 3.7  3.5 - 5.2 g/dL Final  . AST 81/82/9937 30  0 - 37 U/L Final  . ALT 06/27/2014 15  0 - 35 U/L Final  . Alkaline Phosphatase 06/27/2014 114  39 - 117 U/L Final  . Total Bilirubin 06/27/2014 0.6  0.3 - 1.2 mg/dL Final  . GFR calc non Af Amer 06/27/2014 57* >90 mL/min Final  . GFR calc Af Amer 06/27/2014 66* >90 mL/min Final   Comment: (NOTE) The eGFR has been calculated using the CKD EPI equation. This calculation has not been validated in all clinical situations. eGFR's persistently <90 mL/min signify possible Chronic Kidney Disease.   . Anion gap 06/27/2014 6  5 - 15 Final  . Lipase 06/27/2014 20  11 - 59 U/L Final  . Specimen Description 06/27/2014 URINE, CATHETERIZED   Final  . Special Requests 06/27/2014 NONE   Final  . Colony Count 06/27/2014    Final                   Value:40,000 COLONIES/ML Performed at [facility]   . Culture 06/27/2014    Final                   Value:Multiple bacterial morphotypes present, none predominant. Suggest appropriate recollection if clinically indicated. Performed at [facility]   . Report Status 06/27/2014 06/29/2014 FINAL   Final  . Alcohol, Ethyl (B) 06/27/2014 <5  0 - 9 mg/dL Final   Comment:        LOWEST DETECTABLE LIMIT FOR SERUM ALCOHOL IS 11 mg/dL FOR MEDICAL PURPOSES ONLY   . Opiates 06/27/2014 NONE DETECTED  NONE DETECTED Final  . Cocaine 06/27/2014 NONE DETECTED  NONE DETECTED Final  .  Benzodiazepines 06/27/2014 NONE DETECTED  NONE DETECTED Final  . Amphetamines 06/27/2014 NONE DETECTED  NONE DETECTED Final  . Tetrahydrocannabinol 06/27/2014 POSITIVE* NONE DETECTED Final  . Barbiturates 06/27/2014 NONE DETECTED  NONE DETECTED Final   Comment:        DRUG SCREEN FOR MEDICAL PURPOSES ONLY.  IF CONFIRMATION IS NEEDED FOR  ANY PURPOSE, NOTIFY LAB WITHIN 5 DAYS.        LOWEST DETECTABLE LIMITS FOR URINE DRUG SCREEN Drug Class       Cutoff (ng/mL) Amphetamine      1000 Barbiturate      200 Benzodiazepine   458 Tricyclics       099 Opiates          300 Cocaine          300 THC              50   . Color, Urine 06/27/2014 YELLOW  YELLOW Final  . APPearance 06/27/2014 CLEAR  CLEAR Final  . Specific Gravity, Urine 06/27/2014 1.014  1.005 - 1.030 Final  . pH 06/27/2014 6.0  5.0 - 8.0 Final  . Glucose, UA 06/27/2014 NEGATIVE  NEGATIVE mg/dL Final  . Hgb urine dipstick 06/27/2014 NEGATIVE  NEGATIVE Final  . Bilirubin Urine 06/27/2014 NEGATIVE  NEGATIVE Final  . Ketones, ur 06/27/2014 NEGATIVE  NEGATIVE mg/dL Final  . Protein, ur 06/27/2014 NEGATIVE  NEGATIVE mg/dL Final  . Urobilinogen, UA 06/27/2014 1.0  0.0 - 1.0 mg/dL Final  . Nitrite 06/27/2014 NEGATIVE  NEGATIVE Final  . Leukocytes, UA 06/27/2014 TRACE* NEGATIVE Final  . WBC, UA 06/27/2014 0-2  <3 WBC/hpf Final  Admission on 05/29/2014, Discharged on 05/29/2014  Component Date Value Ref Range Status  . Color, Urine 05/29/2014 YELLOW  YELLOW Final  . APPearance 05/29/2014 CLOUDY* CLEAR Final  . Specific Gravity, Urine 05/29/2014 1.011  1.005 - 1.030 Final  . pH 05/29/2014 8.0  5.0 - 8.0 Final  . Glucose, UA 05/29/2014 NEGATIVE  NEGATIVE mg/dL Final  . Hgb urine dipstick 05/29/2014 SMALL* NEGATIVE Final  . Bilirubin Urine 05/29/2014 NEGATIVE  NEGATIVE Final  . Ketones, ur 05/29/2014 NEGATIVE  NEGATIVE mg/dL Final  . Protein, ur 05/29/2014 NEGATIVE  NEGATIVE mg/dL Final  . Urobilinogen, UA 05/29/2014 0.2  0.0 - 1.0  mg/dL Final  . Nitrite 05/29/2014 NEGATIVE  NEGATIVE Final  . Leukocytes, UA 05/29/2014 LARGE* NEGATIVE Final  . Squamous Epithelial / LPF 05/29/2014 RARE  RARE Final  . WBC, UA 05/29/2014 TOO NUMEROUS TO COUNT  <3 WBC/hpf Final  . RBC / HPF 05/29/2014 0-2  <3 RBC/hpf Final  . Bacteria, UA 05/29/2014 MANY* RARE Final  . Crystals 05/29/2014 TRIPLE PHOSPHATE CRYSTALS* NEGATIVE Final  . Specimen Description 05/29/2014 URINE, CATHETERIZED   Final  . Special Requests 05/29/2014 NONE   Final  . Culture  Setup Time 05/29/2014    Final                   Value:05/29/2014 18:07 Performed at [facility]   . Colony Count 05/29/2014    Final                   Value:>=100,000 COLONIES/ML Performed at [facility]   . Culture 05/29/2014    Final                   Value:PROTEUS MIRABILIS Performed at [facility]   . Report Status 05/29/2014 05/31/2014 FINAL   Final  . Organism ID, Bacteria 05/29/2014 PROTEUS MIRABILIS   Final  Admission on 05/23/2014, Discharged on 05/23/2014  Component Date Value Ref Range Status  . Specimen Description 05/23/2014 BLOOD RIGHT ARM  5 ML IN [facility] BOTTLE   Final  . Special Requests 05/23/2014 NONE   Final  . Culture  Setup Time 05/23/2014    Final  Value:05/24/2014 02:57 Performed at [facility]   . Culture 05/23/2014    Final                   Value:NO GROWTH 5 DAYS Performed at [facility]   . Report Status 05/23/2014 05/30/2014 FINAL   Final  . Specimen Description 05/23/2014 BLOOD RIGHT HAND  3 ML IN [facility] BOTTLE   Final  . Special Requests 05/23/2014 NONE   Final  . Culture  Setup Time 05/23/2014    Final                   Value:05/24/2014 02:57 Performed at [facility]   . Culture 05/23/2014    Final                   Value:NO GROWTH 5 DAYS Performed at [facility]   . Report Status 05/23/2014 05/30/2014 FINAL   Final  . WBC 05/23/2014 7.2  4.0 - 10.5 K/uL Final  . RBC  05/23/2014 4.19  3.87 - 5.11 MIL/uL Final  . Hemoglobin 05/23/2014 11.6* 12.0 - 15.0 g/dL Final  . HCT 01/33/9788 34.3* 36.0 - 46.0 % Final  . MCV 05/23/2014 81.9  78.0 - 100.0 fL Final  . MCH 05/23/2014 27.7  26.0 - 34.0 pg Final  . MCHC 05/23/2014 33.8  30.0 - 36.0 g/dL Final  . RDW 50/93/6648 15.7* 11.5 - 15.5 % Final  . Platelets 05/23/2014 284  150 - 400 K/uL Final  . Neutrophils Relative % 05/23/2014 56  43 - 77 % Final  . Neutro Abs 05/23/2014 4.1  1.7 - 7.7 K/uL Final  . Lymphocytes Relative 05/23/2014 30  12 - 46 % Final  . Lymphs Abs 05/23/2014 2.2  0.7 - 4.0 K/uL Final  . Monocytes Relative 05/23/2014 8  3 - 12 % Final  . Monocytes Absolute 05/23/2014 0.6  0.1 - 1.0 K/uL Final  . Eosinophils Relative 05/23/2014 5  0 - 5 % Final  . Eosinophils Absolute 05/23/2014 0.4  0.0 - 0.7 K/uL Final  . Basophils Relative 05/23/2014 1  0 - 1 % Final  . Basophils Absolute 05/23/2014 0.1  0.0 - 0.1 K/uL Final  . Sodium 05/23/2014 144  137 - 147 mEq/L Final  . Potassium 05/23/2014 3.0* 3.7 - 5.3 mEq/L Final  . Chloride 05/23/2014 99  96 - 112 mEq/L Final  . CO2 05/23/2014 30  19 - 32 mEq/L Final  . Glucose, Bld 05/23/2014 115* 70 - 99 mg/dL Final  . BUN 22/40/1104 14  6 - 23 mg/dL Final  . Creatinine, Ser 05/23/2014 1.09  0.50 - 1.10 mg/dL Final  . Calcium 65/32/8329 9.8  8.4 - 10.5 mg/dL Final  . Total Protein 05/23/2014 7.8  6.0 - 8.3 g/dL Final  . Albumin 99/92/0949 3.8  3.5 - 5.2 g/dL Final  . AST 90/83/2478 25  0 - 37 U/L Final  . ALT 05/23/2014 21  0 - 35 U/L Final  . Alkaline Phosphatase 05/23/2014 132* 39 - 117 U/L Final  . Total Bilirubin 05/23/2014 <0.2* 0.3 - 1.2 mg/dL Final  . GFR calc non Af Amer 05/23/2014 58* >90 mL/min Final  . GFR calc Af Amer 05/23/2014 67* >90 mL/min Final   Comment: (NOTE) The eGFR has been calculated using the CKD EPI equation. This calculation has not been validated in all clinical situations. eGFR's persistently <90 mL/min signify possible  Chronic Kidney Disease.   . Anion gap 05/23/2014 15  5 - 15 Final  . Lactic Acid, Venous 05/23/2014 1.35  0.5 - 2.2 mmol/L Final  . Color, Urine 05/23/2014 YELLOW  YELLOW Final  . APPearance 05/23/2014 CLOUDY* CLEAR Final  . Specific Gravity, Urine 05/23/2014 1.005  1.005 - 1.030 Final  . pH 05/23/2014 7.5  5.0 - 8.0 Final  . Glucose, UA 05/23/2014 NEGATIVE  NEGATIVE mg/dL Final  . Hgb urine dipstick 05/23/2014 NEGATIVE  NEGATIVE Final  . Bilirubin Urine 05/23/2014 NEGATIVE  NEGATIVE Final  . Ketones, ur 05/23/2014 NEGATIVE  NEGATIVE mg/dL Final  . Protein, ur 05/23/2014 NEGATIVE  NEGATIVE mg/dL Final  . Urobilinogen, UA 05/23/2014 0.2  0.0 - 1.0 mg/dL Final  . Nitrite 05/23/2014 NEGATIVE  NEGATIVE Final  . Leukocytes, UA 05/23/2014 TRACE* NEGATIVE Final  . Specimen Description 05/23/2014 URINE, CATHETERIZED   Final  . Special Requests 05/23/2014 NONE   Final  . Culture  Setup Time 05/23/2014    Final                   Value:05/24/2014 03:58 Performed at [facility]   . Colony Count 05/23/2014    Final                   Value:55,000 COLONIES/ML Performed at [facility]   . Culture 05/23/2014    Final                   Value:PROTEUS MIRABILIS Performed at [facility]   . Report Status 05/23/2014 05/26/2014 FINAL   Final  . Organism ID, Bacteria 05/23/2014 PROTEUS MIRABILIS   Final  . Pro B Natriuretic peptide (BNP) 05/23/2014 55.3  0 - 125 pg/mL Final  . Troponin i, poc 05/23/2014 0.00  0.00 - 0.08 ng/mL Final  . Comment 3 05/23/2014          Final   Comment: Due to the release kinetics of cTnI, a negative result within the first hours of the onset of symptoms does not rule out myocardial infarction with certainty. If myocardial infarction is still suspected, repeat the test at appropriate intervals.   . Squamous Epithelial / LPF 05/23/2014 RARE  RARE Final  . WBC, UA 05/23/2014 0-2  <3 WBC/hpf Final  . RBC / HPF 05/23/2014 0-2  <3 RBC/hpf Final      No results found.  [facility] records reviewed  Assessment/Plan   ICD-9-CM ICD-10-CM   1. Chronic pain syndrome- uncontrolled 338.4 G89.4   2. MULTIPLE SCLEROSIS, PROGRESSIVE/RELAPSING 340 G35   3. Neurogenic bladder with chronic foley 596.54 N31.9   4. Essential hypertension - stable 401.9 I10   5. Chronic combined systolic and diastolic congestive heart failure- stable 428.42 I50.42    428.0    6. Generalized weakness due to MS 780.79 R53.1     --increase percocet 7.5/325 TID  --f/u with urology for possible suprapubic cath insertion  --continue 1800 cc fluid restriction. Monitor weight daily and call for any weight gain of 2 lbs in 1 day or 5 lbs in 1 week.  --continue all other medications as ordered  --PT as tolerated  --f/u with specialists as scheduled  GOAL: short term rehab for deconditioning. She needs to get back to her baseline functional status. Will d/c home once medically appropriate   Maisee Vollman S. Perlie Gold  [facility] [address] [place], [place] [address] [phone] Office (Wednesdays and Fridays 8 AM - 5 PM) [phone]  Cell (Monday-Friday 8 AM - 5 PM)  --

## 2014-07-24 MED ORDER — NATALIZUMAB 300 MG/15ML IV CONC: 300.0000 mg | Freq: Once | INTRAVENOUS | Status: DC

## 2014-08-11 ENCOUNTER — Other Ambulatory Visit: Payer: Self-pay | Admitting: *Deleted

## 2014-08-11 MED ORDER — OXYCODONE-ACETAMINOPHEN 7.5-325 MG PO TABS: ORAL_TABLET | ORAL | Status: DC

## 2014-08-11 NOTE — Telephone Encounter (Signed)
[facility] 

## 2014-08-16 ENCOUNTER — Non-Acute Institutional Stay (SKILLED_NURSING_FACILITY): Payer: Medicare Other | Admitting: Internal Medicine

## 2014-08-16 ENCOUNTER — Encounter: Payer: Self-pay | Admitting: Internal Medicine

## 2014-08-16 DIAGNOSIS — I5042 Chronic combined systolic (congestive) and diastolic (congestive) heart failure: Secondary | ICD-10-CM | POA: Diagnosis not present

## 2014-08-16 DIAGNOSIS — G894 Chronic pain syndrome: Secondary | ICD-10-CM

## 2014-08-16 DIAGNOSIS — G35 Multiple sclerosis: Secondary | ICD-10-CM

## 2014-08-16 DIAGNOSIS — R531 Weakness: Secondary | ICD-10-CM

## 2014-08-16 DIAGNOSIS — I10 Essential (primary) hypertension: Secondary | ICD-10-CM | POA: Diagnosis not present

## 2014-09-06 NOTE — Progress Notes (Signed)
Patient ID: Shannon Garrett, female   DOB: 06-Sep-1960, 54 y.o.   MRN: [id number]    [place]     Place of Service: [facility] ([facility])    Allergies  Allergen Reactions  . Sulfonamide Derivatives Hives and Shortness Of Breath  . Penicillins Hives and Itching    Chief Complaint  Patient presents with  . Medical Management of Chronic Issues    HPI:  54 yo female seen today for f/u. She has MS and has difficulty performing ADLs without assistance. She receives tysabri infusions and takes amantidine for MS. She cannot raise her arms above her head without great pain and effort. Her appetite is okay but she does not get a lot of exercise due to weakness in extremities. She has a hard time with PT. She c/o inability to make to restroom in time to void and has frequent accidents. She does not have adult briefs. Takes vesicare and oxybutynin. She also has muscle spasms and chronic pain and takes percocet and baclofen  HTN is stable on coreg and furosemide.  She has depression but not well controlled on cymbalta and imipramine. No SI/HI at this time  Heart failure controlled on lasix, coreg and spironolactone. No new SOB or CP.  CODE STATUS: DNR  Medications: Patient's Medications  New Prescriptions   No medications on file  Previous Medications   AMANTADINE (SYMMETREL) 100 MG CAPSULE    Take 100 mg by mouth 2 (two) times daily.    BACLOFEN (LIORESAL) 10 MG TABLET    Take 1 tablet by mouth daily.   CARVEDILOL (COREG) 25 MG TABLET    Take 1 tablet (25 mg total) by mouth 2 (two) times daily with a meal.   CHOLECALCIFEROL (VITAMIN D-3 PO)    Take 1 tablet by mouth daily.   CYANOCOBALAMIN (VITAMIN B-12 PO)    Take 1 tablet by mouth daily.   DULOXETINE (CYMBALTA) 30 MG CAPSULE    Take 1 capsule by mouth daily.   FUROSEMIDE (LASIX) 80 MG TABLET    Take 1 tablet (80 mg total) by mouth 2 (two) times daily. Take 160 mg every morning and 80 mg po q HS   GABAPENTIN (NEURONTIN)  600 MG TABLET    Take 1 tablet (600 mg total) by mouth 3 (three) times daily.   IMIPRAMINE (TOFRANIL) 50 MG TABLET    Take 50 mg by mouth every evening.    LATANOPROST (XALATAN) 0.005 % OPHTHALMIC SOLUTION    Place 1 drop into both eyes at bedtime.   MULTIPLE VITAMIN (MULTIVITAMIN WITH MINERALS) TABS TABLET    Take 1 tablet by mouth daily.   NATALIZUMAB (TYSABRI) 300 MG/15ML INJECTION    Inject 15 mLs (300 mg total) into the vein once.   OXYBUTYNIN (DITROPAN) 5 MG TABLET    Take 1 tablet (5 mg total) by mouth every 8 (eight) hours as needed for bladder spasms.   OXYCODONE-ACETAMINOPHEN (PERCOCET) 7.5-325 MG PER TABLET    Take one tablet by mouth three times daily for pain. Do not exceed 3gm of APAP from all sources/24hr   POTASSIUM CHLORIDE SA (K-DUR,KLOR-CON) 20 MEQ TABLET    Take 2 tablets (40 mEq total) by mouth daily.   SOLIFENACIN (VESICARE) 10 MG TABLET    Take 10 mg by mouth 2 (two) times daily.    SPIRONOLACTONE (ALDACTONE) 25 MG TABLET    Take 25 mg by mouth daily.   ZOLPIDEM (AMBIEN) 5 MG TABLET    Take 1 tablet (  5 mg total) by mouth at bedtime as needed for sleep.  Modified Medications   No medications on file  Discontinued Medications   No medications on file     Review of Systems  Constitutional: Positive for activity change, appetite change and fatigue. Negative for fever, chills and diaphoresis.  HENT: Negative for ear pain, sore throat and trouble swallowing.   Eyes: Negative for visual disturbance.  Respiratory: Negative for cough, chest tightness and shortness of breath.   Cardiovascular: Negative for chest pain, palpitations and leg swelling.  Gastrointestinal: Negative for nausea, vomiting, abdominal pain, diarrhea, constipation and blood in stool.  Genitourinary: Negative for dysuria.  Musculoskeletal: Positive for myalgias, arthralgias and gait problem.  Skin: Negative for rash.  Neurological: Positive for weakness. Negative for dizziness, tremors, numbness and  headaches.  Psychiatric/Behavioral: Positive for sleep disturbance. The patient is not nervous/anxious.     Filed Vitals:   08/16/14 1642  BP: 122/76  Pulse: 88  Temp: 98.1 F (36.7 C)  SpO2: 98%   There is no weight on file to calculate BMI.  Physical Exam  Constitutional: She is oriented to person, place, and time. No distress.  Frail appearing in NAD. Awake  HENT:  Mouth/Throat: Oropharynx is clear and moist. Mucous membranes are dry. No oropharyngeal exudate.  Eyes: Pupils are equal, round, and reactive to light. No scleral icterus.  Neck: Neck supple. No tracheal deviation present. No thyromegaly present.  Cardiovascular: Normal rate, regular rhythm, normal heart sounds and intact distal pulses.  Exam reveals no gallop and no friction rub.   No murmur heard. Trace LE edema b/l. no calf TTP. B/l LE boots intact. No carotid bruit b/l  Pulmonary/Chest: Effort normal and breath sounds normal. No stridor. No respiratory distress. She has no wheezes. She has no rales.  Abdominal: Soft. Bowel sounds are normal. She exhibits no distension and no mass. There is no tenderness. There is no rebound and no guarding.  Musculoskeletal: She exhibits edema and tenderness.  Lymphadenopathy:    She has no cervical adenopathy.  Neurological: She is alert and oriented to person, place, and time. She displays atrophy. She displays no tremor.  (+) LUE weakness  Skin: Skin is warm and dry. No rash noted.  Psychiatric: Her speech is normal. Judgment and thought content normal. She is withdrawn. She exhibits a depressed mood.     Labs reviewed: Admission on 07/16/2014, Discharged on 07/16/2014  Component Date Value Ref Range Status  . Color, Urine 07/16/2014 YELLOW  YELLOW Final  . APPearance 07/16/2014 CLEAR  CLEAR Final  . Specific Gravity, Urine 07/16/2014 1.012  1.005 - 1.030 Final  . pH 07/16/2014 7.0  5.0 - 8.0 Final  . Glucose, UA 07/16/2014 NEGATIVE  NEGATIVE mg/dL Final  . Hgb urine  dipstick 07/16/2014 NEGATIVE  NEGATIVE Final  . Bilirubin Urine 07/16/2014 NEGATIVE  NEGATIVE Final  . Ketones, ur 07/16/2014 NEGATIVE  NEGATIVE mg/dL Final  . Protein, ur 07/16/2014 NEGATIVE  NEGATIVE mg/dL Final  . Urobilinogen, UA 07/16/2014 1.0  0.0 - 1.0 mg/dL Final  . Nitrite 07/16/2014 NEGATIVE  NEGATIVE Final  . Leukocytes, UA 07/16/2014 SMALL* NEGATIVE Final  . WBC 07/16/2014 5.3  4.0 - 10.5 K/uL Final  . RBC 07/16/2014 4.21  3.87 - 5.11 MIL/uL Final  . Hemoglobin 07/16/2014 11.6* 12.0 - 15.0 g/dL Final  . HCT 07/16/2014 34.0* 36.0 - 46.0 % Final  . MCV 07/16/2014 80.8  78.0 - 100.0 fL Final  . MCH 07/16/2014 27.6  26.0 -  34.0 pg Final  . MCHC 07/16/2014 34.1  30.0 - 36.0 g/dL Final  . RDW 07/16/2014 15.4  11.5 - 15.5 % Final  . Platelets 07/16/2014 273  150 - 400 K/uL Final  . Neutrophils Relative % 07/16/2014 56  43 - 77 % Final  . Neutro Abs 07/16/2014 3.0  1.7 - 7.7 K/uL Final  . Lymphocytes Relative 07/16/2014 28  12 - 46 % Final  . Lymphs Abs 07/16/2014 1.5  0.7 - 4.0 K/uL Final  . Monocytes Relative 07/16/2014 11  3 - 12 % Final  . Monocytes Absolute 07/16/2014 0.6  0.1 - 1.0 K/uL Final  . Eosinophils Relative 07/16/2014 4  0 - 5 % Final  . Eosinophils Absolute 07/16/2014 0.2  0.0 - 0.7 K/uL Final  . Basophils Relative 07/16/2014 1  0 - 1 % Final  . Basophils Absolute 07/16/2014 0.0  0.0 - 0.1 K/uL Final  . Sodium 07/16/2014 140  135 - 145 mmol/L Final   Please note change in reference range.  . Potassium 07/16/2014 3.9  3.5 - 5.1 mmol/L Final   Please note change in reference range.  . Chloride 07/16/2014 101  96 - 112 mEq/L Final  . CO2 07/16/2014 31  19 - 32 mmol/L Final  . Glucose, Bld 07/16/2014 86  70 - 99 mg/dL Final  . BUN 07/16/2014 13  6 - 23 mg/dL Final  . Creatinine, Ser 07/16/2014 1.01  0.50 - 1.10 mg/dL Final  . Calcium 07/16/2014 8.5  8.4 - 10.5 mg/dL Final  . GFR calc non Af Amer 07/16/2014 63* >90 mL/min Final  . GFR calc Af Amer 07/16/2014 73*  >90 mL/min Final   Comment: (NOTE) The eGFR has been calculated using the CKD EPI equation. This calculation has not been validated in all clinical situations. eGFR's persistently <90 mL/min signify possible Chronic Kidney Disease.   . Anion gap 07/16/2014 8  5 - 15 Final  . Total CK 07/16/2014 376* 7 - 177 U/L Final  . Squamous Epithelial / LPF 07/16/2014 RARE  RARE Final  . WBC, UA 07/16/2014 3-6  <3 WBC/hpf Final  Admission on 06/27/2014, Discharged on 06/27/2014  Component Date Value Ref Range Status  . WBC 06/27/2014 4.8  4.0 - 10.5 K/uL Final  . RBC 06/27/2014 3.90  3.87 - 5.11 MIL/uL Final  . Hemoglobin 06/27/2014 10.6* 12.0 - 15.0 g/dL Final  . HCT 06/27/2014 31.6* 36.0 - 46.0 % Final  . MCV 06/27/2014 81.0  78.0 - 100.0 fL Final  . MCH 06/27/2014 27.2  26.0 - 34.0 pg Final  . MCHC 06/27/2014 33.5  30.0 - 36.0 g/dL Final  . RDW 06/27/2014 15.1  11.5 - 15.5 % Final  . Platelets 06/27/2014 227  150 - 400 K/uL Final  . Neutrophils Relative % 06/27/2014 53  43 - 77 % Final  . Neutro Abs 06/27/2014 2.6  1.7 - 7.7 K/uL Final  . Lymphocytes Relative 06/27/2014 28  12 - 46 % Final  . Lymphs Abs 06/27/2014 1.3  0.7 - 4.0 K/uL Final  . Monocytes Relative 06/27/2014 11  3 - 12 % Final  . Monocytes Absolute 06/27/2014 0.5  0.1 - 1.0 K/uL Final  . Eosinophils Relative 06/27/2014 7* 0 - 5 % Final  . Eosinophils Absolute 06/27/2014 0.3  0.0 - 0.7 K/uL Final  . Basophils Relative 06/27/2014 1  0 - 1 % Final  . Basophils Absolute 06/27/2014 0.0  0.0 - 0.1 K/uL Final  . Sodium 06/27/2014 140  135 - 145 mmol/L Final   Please note change in reference range.  . Potassium 06/27/2014 3.7  3.5 - 5.1 mmol/L Final   Comment: Please note change in reference range. DELTA CHECK NOTED   . Chloride 06/27/2014 104  96 - 112 mEq/L Final  . CO2 06/27/2014 30  19 - 32 mmol/L Final  . Glucose, Bld 06/27/2014 103* 70 - 99 mg/dL Final  . BUN 06/27/2014 11  6 - 23 mg/dL Final  . Creatinine, Ser 06/27/2014  1.10  0.50 - 1.10 mg/dL Final  . Calcium 06/27/2014 8.7  8.4 - 10.5 mg/dL Final  . Total Protein 06/27/2014 7.0  6.0 - 8.3 g/dL Final  . Albumin 06/27/2014 3.7  3.5 - 5.2 g/dL Final  . AST 06/27/2014 30  0 - 37 U/L Final  . ALT 06/27/2014 15  0 - 35 U/L Final  . Alkaline Phosphatase 06/27/2014 114  39 - 117 U/L Final  . Total Bilirubin 06/27/2014 0.6  0.3 - 1.2 mg/dL Final  . GFR calc non Af Amer 06/27/2014 57* >90 mL/min Final  . GFR calc Af Amer 06/27/2014 66* >90 mL/min Final   Comment: (NOTE) The eGFR has been calculated using the CKD EPI equation. This calculation has not been validated in all clinical situations. eGFR's persistently <90 mL/min signify possible Chronic Kidney Disease.   . Anion gap 06/27/2014 6  5 - 15 Final  . Lipase 06/27/2014 20  11 - 59 U/L Final  . Specimen Description 06/27/2014 URINE, CATHETERIZED   Final  . Special Requests 06/27/2014 NONE   Final  . Colony Count 06/27/2014    Final                   Value:40,000 COLONIES/ML Performed at [facility]   . Culture 06/27/2014    Final                   Value:Multiple bacterial morphotypes present, none predominant. Suggest appropriate recollection if clinically indicated. Performed at [facility]   . Report Status 06/27/2014 06/29/2014 FINAL   Final  . Alcohol, Ethyl (B) 06/27/2014 <5  0 - 9 mg/dL Final   Comment:        LOWEST DETECTABLE LIMIT FOR SERUM ALCOHOL IS 11 mg/dL FOR MEDICAL PURPOSES ONLY   . Opiates 06/27/2014 NONE DETECTED  NONE DETECTED Final  . Cocaine 06/27/2014 NONE DETECTED  NONE DETECTED Final  . Benzodiazepines 06/27/2014 NONE DETECTED  NONE DETECTED Final  . Amphetamines 06/27/2014 NONE DETECTED  NONE DETECTED Final  . Tetrahydrocannabinol 06/27/2014 POSITIVE* NONE DETECTED Final  . Barbiturates 06/27/2014 NONE DETECTED  NONE DETECTED Final   Comment:        DRUG SCREEN FOR MEDICAL PURPOSES ONLY.  IF CONFIRMATION IS NEEDED FOR ANY PURPOSE, NOTIFY LAB WITHIN  5 DAYS.        LOWEST DETECTABLE LIMITS FOR URINE DRUG SCREEN Drug Class       Cutoff (ng/mL) Amphetamine      1000 Barbiturate      200 Benzodiazepine   195 Tricyclics       093 Opiates          300 Cocaine          300 THC              50   . Color, Urine 06/27/2014 YELLOW  YELLOW Final  . APPearance 06/27/2014 CLEAR  CLEAR Final  . Specific Gravity, Urine 06/27/2014 1.014  1.005 -  1.030 Final  . pH 06/27/2014 6.0  5.0 - 8.0 Final  . Glucose, UA 06/27/2014 NEGATIVE  NEGATIVE mg/dL Final  . Hgb urine dipstick 06/27/2014 NEGATIVE  NEGATIVE Final  . Bilirubin Urine 06/27/2014 NEGATIVE  NEGATIVE Final  . Ketones, ur 06/27/2014 NEGATIVE  NEGATIVE mg/dL Final  . Protein, ur 06/27/2014 NEGATIVE  NEGATIVE mg/dL Final  . Urobilinogen, UA 06/27/2014 1.0  0.0 - 1.0 mg/dL Final  . Nitrite 06/27/2014 NEGATIVE  NEGATIVE Final  . Leukocytes, UA 06/27/2014 TRACE* NEGATIVE Final  . WBC, UA 06/27/2014 0-2  <3 WBC/hpf Final     Assessment/Plan   ICD-9-CM ICD-10-CM   1. MULTIPLE SCLEROSIS, PROGRESSIVE/RELAPSING - stable on tysabri and amantidine 340 G35   2. Chronic pain syndrome - controlled on gabapentin, cymbalta and percocet 338.4 G89.4   3. Essential hypertension - controlled on lasix, coreg 401.9 I10   4. Generalized weakness - stable 780.79 R53.1   5. Chronic combined systolic and diastolic congestive heart failure - stable on lasix, coreg, spironolactone 428.42 I50.42    428.0    6. Hypothyroidism - stable on current dose of levothyroxine  --maintain fluid restriction 1800cc per day  --f/u with neurology Dr Trippmarch 28th as scheduled  --Pt is medically stable on current tx plan. Continue current medications as ordered. PT/OT/ST as indicated. CBC w diff and CMP in April 2016. Will follow    Medhansh Brinkmeier S. Perlie Gold  [facility] [address] [place], [place] [address] [phone] Office (Wednesdays and Fridays 8 AM - 5  PM) [phone] Cell (Monday-Friday 8 AM - 5 PM)

## 2014-09-16 ENCOUNTER — Non-Acute Institutional Stay (SKILLED_NURSING_FACILITY): Payer: Medicare Other | Admitting: Adult Health

## 2014-09-16 DIAGNOSIS — I10 Essential (primary) hypertension: Secondary | ICD-10-CM

## 2014-09-16 DIAGNOSIS — N319 Neuromuscular dysfunction of bladder, unspecified: Secondary | ICD-10-CM | POA: Diagnosis not present

## 2014-09-16 DIAGNOSIS — I5042 Chronic combined systolic (congestive) and diastolic (congestive) heart failure: Secondary | ICD-10-CM | POA: Diagnosis not present

## 2014-09-16 DIAGNOSIS — G8220 Paraplegia, unspecified: Secondary | ICD-10-CM

## 2014-09-16 DIAGNOSIS — G35 Multiple sclerosis: Secondary | ICD-10-CM

## 2014-09-16 DIAGNOSIS — G8929 Other chronic pain: Secondary | ICD-10-CM

## 2014-09-27 ENCOUNTER — Ambulatory Visit (INDEPENDENT_AMBULATORY_CARE_PROVIDER_SITE_OTHER): Payer: Medicare Other | Admitting: Neurology

## 2014-09-27 ENCOUNTER — Encounter: Payer: Self-pay | Admitting: Neurology

## 2014-09-27 VITALS — BP 140/72 | HR 68 | Resp 20 | Ht 65.0 in | Wt 207.0 lb

## 2014-09-27 DIAGNOSIS — G3184 Mild cognitive impairment, so stated: Secondary | ICD-10-CM

## 2014-09-27 DIAGNOSIS — G35 Multiple sclerosis: Secondary | ICD-10-CM | POA: Diagnosis not present

## 2014-09-27 DIAGNOSIS — G894 Chronic pain syndrome: Secondary | ICD-10-CM

## 2014-09-27 DIAGNOSIS — G35C0 Secondary progressive multiple sclerosis, unspecified: Secondary | ICD-10-CM

## 2014-09-27 MED ORDER — GABAPENTIN 300 MG PO CAPS: 900.0000 mg | ORAL_CAPSULE | Freq: Three times a day (TID) | ORAL | Status: DC

## 2014-09-27 MED ORDER — BACLOFEN 10 MG PO TABS: 10.0000 mg | ORAL_TABLET | Freq: Three times a day (TID) | ORAL | Status: DC

## 2014-09-27 NOTE — Patient Instructions (Addendum)
1.   Check CBC w/diff, CMP, and JC virus antibody 2.  Check MRI of brain and cervical spine with and without contrast  [facility] 10/08/14 at 12:45pm  3.  Increase gabapentin to 900mg  three times daily and increase baclofen to 10mg  three times daily 4.  Pending results of labs, will set up for Tysabri 5.  Follow up in 4 months.

## 2014-09-27 NOTE — Progress Notes (Addendum)
NEUROLOGY CONSULTATION NOTE  Shannon Garrett MRN: [id number] DOB: 03-24-1961  Referring provider: Dr. Fredderick Phenix Primary care provider: Dr. 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  Records, labs and MRI from 2014 reviewed.  She was diagnosed with MS in her late 46s.  At that time, she was experiencing vision loss, numbness in the legs and vertigo.  She was diagnosed via MRI.  She did not have a lumbar puncture.   Her previous disease modifying agents include Avonex and Tecfedra.  She was then started on Tysabri, but has not been on it for about 6-7 months.  She says she kept missing appointments because she did not have reliable transportation.  At first, she had a relapsing-remitting course, but subsequently had a gradual decline.  She needed to resort using a walker about 10 years ago.    Over the past several months, she continued to decline.  She moved from her home into [facility] 2 months ago.  She has been unable to walk since then.  She suffers from chronic pain.  She reports bladder spasms and incomplete emptying of her bladder.  She has had UTIs due to this.    She has a urologist.   Other medications include: Amantadine 100mg  twice daily Baclofen 10mg  daily for spasticity Gabapentin 600mg  three times daily for pain Percocet 7.5-325mg  three times daily for pain Imipramine 50mg  every evening Cymbalta 30mg  daily for pain and depression Oxybutynin 5mg  every 8 hours as needed for bladder spasms Ambien 5mg  at bedtime as needed for insomnia Vitamin D3 1000 units daily Vitamin B12 1000 mcg daily  She has significant history of depression.  She has been hospitalized for her depression on several occasions.  She has history of suicidal ideation  but not currently.  Most recent MRI of the brain with and without contrast to review is from 09/05/12.  Imaging shows global atrophy with diffuse patchy white matter hyperintensities in the perventircular and subcortical region, with largest lesions involving the right parietal and left mid frontal periventricular area.  There is also a right pituitary 1.2 x 1.1 x 1.2 cm mass, which may be a pituitary adenoma.  Most recent labs are from 06/27/14, which shows WBC 4.8 and normal differential, and normal LFTs.    PAST MEDICAL HISTORY: Past Medical History  Diagnosis Date  . MS (multiple sclerosis)     a. Dx'd late 20's. b. Tx with Novantrone, Tysabri, Copaxone previously.  Marland Kitchen Hypertension   . Depression   . Glaucoma   . Cardiomyopathy     a.  Echo 04/29/12: Mild LVH, EF 20-25%, mild AI, moderate MR, moderate LAE, mild RAE, mild RVE, moderate TR, PASP 51, small pericardial effusion;   b. probably non-ischemic given multiple chemo-Tx agents used for MS and global LV dysfn on echo  . Chronic systolic heart failure   . CHF (congestive heart failure)   . Shortness of breath   . Neuromuscular disorder     MS    PAST SURGICAL HISTORY: Past Surgical History  Procedure Laterality Date  . Ablation      uterine  . Cesarean section      MEDICATIONS: Current Outpatient Prescriptions on File Prior to Visit  Medication Sig Dispense Refill  . amantadine (SYMMETREL) 100 MG capsule Take 100 mg by mouth  2 (two) times daily.     . carvedilol (COREG) 25 MG tablet Take 1 tablet (25 mg total) by mouth 2 (two) times daily with a meal.    . Cholecalciferol (VITAMIN D-3 PO) Take 1 tablet by mouth daily.    . Cyanocobalamin (VITAMIN B-12 PO) Take 1 tablet by mouth daily.    . DULoxetine (CYMBALTA) 30 MG capsule Take 1 capsule by mouth daily.    . furosemide (LASIX) 80 MG tablet Take 1 tablet (80 mg total) by mouth 2 (two) times daily. Take 160 mg every morning and 80 mg po q HS    . imipramine (TOFRANIL) 50  MG tablet Take 50 mg by mouth every evening.     . latanoprost (XALATAN) 0.005 % ophthalmic solution Place 1 drop into both eyes at bedtime.    . Multiple Vitamin (MULTIVITAMIN WITH MINERALS) TABS tablet Take 1 tablet by mouth daily.    Marland Kitchen oxybutynin (DITROPAN) 5 MG tablet Take 1 tablet (5 mg total) by mouth every 8 (eight) hours as needed for bladder spasms. 21 tablet 0  . oxyCODONE-acetaminophen (PERCOCET) 7.5-325 MG per tablet Take one tablet by mouth three times daily for pain. Do not exceed 3gm of APAP from all sources/24hr 90 tablet 0  . potassium chloride SA (K-DUR,KLOR-CON) 20 MEQ tablet Take 2 tablets (40 mEq total) by mouth daily. 60 tablet 11  . solifenacin (VESICARE) 10 MG tablet Take 10 mg by mouth 2 (two) times daily.     Marland Kitchen spironolactone (ALDACTONE) 25 MG tablet Take 25 mg by mouth daily.    Marland Kitchen zolpidem (AMBIEN) 5 MG tablet Take 1 tablet (5 mg total) by mouth at bedtime as needed for sleep. 30 tablet 5  . natalizumab (TYSABRI) 300 MG/15ML injection Inject 15 mLs (300 mg total) into the vein once. (Patient not taking: Reported on 09/27/2014) 15 mL    No current facility-administered medications on file prior to visit.    ALLERGIES: Allergies  Allergen Reactions  . Sulfonamide Derivatives Hives and Shortness Of Breath  . Penicillins Hives and Itching    FAMILY HISTORY: Family History  Problem Relation Age of Onset  . Hypertension Mother   . Heart attack Neg Hx   . Cancer Mother     breast   . Cancer Father     prostate  . Multiple sclerosis Sister     SOCIAL HISTORY: History   Social History  . Marital Status: Single    Spouse Name: N/A  . Number of Children: N/A  . Years of Education: N/A   Occupational History  . Not on file.   Social History Main Topics  . Smoking status: Former Research scientist (life sciences)  . Smokeless tobacco: Never Used  . Alcohol Use: No  . Drug Use: No  . Sexual Activity: Not Currently   Other Topics Concern  . Not on file   Social History Narrative     REVIEW OF SYSTEMS: Constitutional: No fevers, chills, or sweats, no generalized fatigue, change in appetite Eyes: No visual changes, double vision, eye pain Ear, nose and throat: No hearing loss, ear pain, nasal congestion, sore throat Cardiovascular: No chest pain, palpitations Respiratory:  No shortness of breath at rest or with exertion, wheezes GastrointestinaI: No nausea, vomiting, diarrhea, abdominal pain, fecal incontinence Genitourinary:  Bladder spasms, pain Musculoskeletal:  Neck and back pain Integumentary: No rash, pruritus, skin lesions Neurological: as above Psychiatric: Depression, insomnia Endocrine: No palpitations, fatigue, diaphoresis, mood swings, change in appetite, change in weight, increased thirst  Hematologic/Lymphatic:  No anemia, purpura, petechiae. Allergic/Immunologic: no itchy/runny eyes, nasal congestion, recent allergic reactions, rashes  PHYSICAL EXAM: Filed Vitals:   09/27/14 0813  BP: 140/72  Pulse: 68  Resp: 20   General: No acute distress Head:  Normocephalic/atraumatic Eyes:  Unable to visualize fundi on exam Neck: supple, bilateral paraspinal tenderness. Back: No paraspinal tenderness Heart: regular rate and rhythm Lungs: Clear to auscultation bilaterally. Vascular: No carotid bruits. Neurological Exam: Mental status: alert and oriented to person, place, and time, recent poor and remote memory intact, fund of knowledge intact, attention and concentration impaired, speech fluent and not dysarthric, language intact. Montreal Cognitive Assessment  09/27/2014  Visuospatial/ Executive (0/5) 2  Naming (0/3) 3  Attention: Read list of digits (0/2) 2  Attention: Read list of letters (0/1) 1  Attention: Serial 7 subtraction starting at 100 (0/3) 1  Language: Repeat phrase (0/2) 1  Language : Fluency (0/1) 0  Abstraction (0/2) 2  Delayed Recall (0/5) 0  Orientation (0/6) 6  Total 18  Adjusted Score (based on education) 18   Cranial  nerves: CN I: not tested CN II: pupils equal, round and reactive to light, visual fields intact CN III, IV, VI:  full range of motion, no nystagmus, no ptosis CN V: facial sensation intact CN VII: upper and lower face symmetric CN VIII: hearing intact CN IX, X: gag intact, uvula midline CN XI: sternocleidomastoid and trapezius muscles intact CN XII: tongue midline Bulk & Tone: increased tone in all extremities with decreased range of motion of left arm and flexion of wrists. Motor:  4+/5 left deltoid, 3+ left triceps, 3+ bilateral interossei, 4- bilateral wrists flexion/extension, 2/5 lower extremities Sensation:  Pinprick and vibration intact Deep Tendon Reflexes:  2+ throughout, toes equivocal Finger to nose testing:  No dysmetria Heel to shin:  Unable to perform Gait:  Unable to stand or walk.  IMPRESSION: Secondary progressive multiple sclerosis  PLAN: 1.  Will check CBC with diff, LFTs and JC virus antibodies 2.  Pending results, may restart Tysabri 3.  MRI of brain and cervical spine with and without contrast. 4.  Increase gabapentin to 900mg  three times daily and increase baclofen to 10mg  three times daily 5.  Get notes from prior neurologist and more complete notes from PCP 6.  Follow up in 4 months.  Thank you for allowing me to take part in the care of this patient.  Metta Clines, DO  CC: Reymundo Poll, MD  ADDENDUM:  Received notes from her prior neurologist, who turns out to be Dr. Felecia Shelling.  He now works here in [place], and it would likely be best that she remain under his care.

## 2014-09-28 LAB — BASIC METABOLIC PANEL
BUN: 16 mg/dL (ref 4–21)
Glucose: 87 mg/dL
Potassium: 3.5 mmol/L (ref 3.4–5.3)
SODIUM: 142 mmol/L (ref 137–147)

## 2014-09-28 LAB — CBC AND DIFFERENTIAL
HCT: 32 % — AB (ref 36–46)
Hemoglobin: 10 g/dL — AB (ref 12.0–16.0)
Platelets: 323 10*3/uL (ref 150–399)
WBC: 5.7 10^3/mL

## 2014-10-05 ENCOUNTER — Encounter (HOSPITAL_COMMUNITY): Payer: Self-pay | Admitting: Emergency Medicine

## 2014-10-05 ENCOUNTER — Emergency Department (HOSPITAL_COMMUNITY)
Admission: EM | Admit: 2014-10-05 | Discharge: 2014-10-05 | Disposition: A | Discharge status: Home / Self Care | Payer: Medicare Other | Attending: Emergency Medicine | Admitting: Emergency Medicine

## 2014-10-05 ENCOUNTER — Emergency Department (HOSPITAL_COMMUNITY): Payer: Medicare Other

## 2014-10-05 DIAGNOSIS — N390 Urinary tract infection, site not specified: Secondary | ICD-10-CM | POA: Diagnosis not present

## 2014-10-05 DIAGNOSIS — I10 Essential (primary) hypertension: Secondary | ICD-10-CM | POA: Diagnosis not present

## 2014-10-05 DIAGNOSIS — Z87891 Personal history of nicotine dependence: Secondary | ICD-10-CM | POA: Diagnosis not present

## 2014-10-05 DIAGNOSIS — R1032 Left lower quadrant pain: Secondary | ICD-10-CM | POA: Diagnosis present

## 2014-10-05 DIAGNOSIS — I5022 Chronic systolic (congestive) heart failure: Secondary | ICD-10-CM | POA: Insufficient documentation

## 2014-10-05 DIAGNOSIS — Z880 Allergy status to penicillin: Secondary | ICD-10-CM | POA: Insufficient documentation

## 2014-10-05 DIAGNOSIS — R110 Nausea: Secondary | ICD-10-CM | POA: Diagnosis not present

## 2014-10-05 DIAGNOSIS — F329 Major depressive disorder, single episode, unspecified: Secondary | ICD-10-CM | POA: Insufficient documentation

## 2014-10-05 DIAGNOSIS — Z79899 Other long term (current) drug therapy: Secondary | ICD-10-CM | POA: Diagnosis not present

## 2014-10-05 DIAGNOSIS — E876 Hypokalemia: Secondary | ICD-10-CM | POA: Diagnosis not present

## 2014-10-05 DIAGNOSIS — R1030 Lower abdominal pain, unspecified: Secondary | ICD-10-CM

## 2014-10-05 DIAGNOSIS — H409 Unspecified glaucoma: Secondary | ICD-10-CM | POA: Insufficient documentation

## 2014-10-05 LAB — COMPREHENSIVE METABOLIC PANEL
ALBUMIN: 3.7 g/dL (ref 3.5–5.2)
ALK PHOS: 120 U/L — AB (ref 39–117)
ALT: 13 U/L (ref 0–35)
AST: 16 U/L (ref 0–37)
Anion gap: 11 (ref 5–15)
BUN: 24 mg/dL — AB (ref 6–23)
CHLORIDE: 99 mmol/L (ref 96–112)
CO2: 29 mmol/L (ref 19–32)
Calcium: 9.2 mg/dL (ref 8.4–10.5)
Creatinine, Ser: 1.02 mg/dL (ref 0.50–1.10)
GFR calc Af Amer: 73 mL/min — ABNORMAL LOW (ref >90)
GFR calc non Af Amer: 63 mL/min — ABNORMAL LOW (ref >90)
Glucose, Bld: 94 mg/dL (ref 70–99)
POTASSIUM: 3.3 mmol/L — AB (ref 3.5–5.1)
Sodium: 139 mmol/L (ref 135–145)
Total Bilirubin: 0.3 mg/dL (ref 0.3–1.2)
Total Protein: 7.2 g/dL (ref 6.0–8.3)

## 2014-10-05 LAB — URINALYSIS, ROUTINE W REFLEX MICROSCOPIC
Bilirubin Urine: NEGATIVE
Glucose, UA: NEGATIVE mg/dL
KETONES UR: NEGATIVE mg/dL
NITRITE: POSITIVE — AB
PH: 5.5 (ref 5.0–8.0)
Protein, ur: NEGATIVE mg/dL
Specific Gravity, Urine: 1.014 (ref 1.005–1.030)
Urobilinogen, UA: 1 mg/dL (ref 0.0–1.0)

## 2014-10-05 LAB — CBC WITH DIFFERENTIAL/PLATELET
BASOS ABS: 0.1 10*3/uL (ref 0.0–0.1)
BASOS PCT: 1 % (ref 0–1)
EOS ABS: 0.7 10*3/uL (ref 0.0–0.7)
EOS PCT: 13 % — AB (ref 0–5)
HEMATOCRIT: 32.2 % — AB (ref 36.0–46.0)
HEMOGLOBIN: 10.8 g/dL — AB (ref 12.0–15.0)
Lymphocytes Relative: 25 % (ref 12–46)
Lymphs Abs: 1.3 10*3/uL (ref 0.7–4.0)
MCH: 27.8 pg (ref 26.0–34.0)
MCHC: 33.5 g/dL (ref 30.0–36.0)
MCV: 82.8 fL (ref 78.0–100.0)
MONOS PCT: 10 % (ref 3–12)
Monocytes Absolute: 0.5 10*3/uL (ref 0.1–1.0)
Neutro Abs: 2.6 10*3/uL (ref 1.7–7.7)
Neutrophils Relative %: 51 % (ref 43–77)
Platelets: 309 10*3/uL (ref 150–400)
RBC: 3.89 MIL/uL (ref 3.87–5.11)
RDW: 15.7 % — ABNORMAL HIGH (ref 11.5–15.5)
WBC: 5 10*3/uL (ref 4.0–10.5)

## 2014-10-05 LAB — URINE MICROSCOPIC-ADD ON

## 2014-10-05 LAB — LIPASE, BLOOD: Lipase: 17 U/L (ref 11–59)

## 2014-10-05 MED ORDER — SODIUM CHLORIDE 0.9 % IV SOLN
Freq: Once | INTRAVENOUS | Status: AC
  Administered 2014-10-05: 08:00:00 via INTRAVENOUS

## 2014-10-05 MED ORDER — DEXTROSE 5 % IV SOLN
1.0000 g | Freq: Once | INTRAVENOUS | Status: AC
  Administered 2014-10-05: 1 g via INTRAVENOUS
  Filled 2014-10-05: qty 10

## 2014-10-05 MED ORDER — CEPHALEXIN 500 MG PO CAPS: 500.0000 mg | ORAL_CAPSULE | Freq: Two times a day (BID) | ORAL | Status: DC

## 2014-10-05 MED ORDER — IOHEXOL 300 MG/ML  SOLN
50.0000 mL | Freq: Once | INTRAMUSCULAR | Status: AC | PRN
  Administered 2014-10-05: 50 mL via ORAL

## 2014-10-05 MED ORDER — HYDROMORPHONE HCL 1 MG/ML IJ SOLN
1.0000 mg | Freq: Once | INTRAMUSCULAR | Status: AC
  Administered 2014-10-05: 1 mg via INTRAVENOUS
  Filled 2014-10-05: qty 1

## 2014-10-05 NOTE — ED Provider Notes (Signed)
CSN: [id number]     Arrival date & time 10/05/14  5621 History   First MD Initiated Contact with Patient 10/05/14 [phone]     Chief Complaint  Patient presents with  . Abdominal Pain     (Consider location/radiation/quality/duration/timing/severity/associated sxs/prior Treatment) HPI Comments: 54 year old female with history of obesity, multiple sclerosis, high blood pressure, heart failure, malnutrition, recent nursing home placement presents with severe lower abdominal pain bilateral and central worsening for the past week. Patient per her report was supposed to see local doctor however has not.  No history of similar pain. Patient has had urine infection the past but denies urinary symptoms. Pain is sharp severe nonradiating.  Patient is a 54 y.o. female presenting with abdominal pain. The history is provided by the patient.  Abdominal Pain Associated symptoms: nausea   Associated symptoms: no chest pain, no chills, no diarrhea, no dysuria, no fever, no shortness of breath and no vomiting     Past Medical History  Diagnosis Date  . MS (multiple sclerosis)     a. Dx'd late 20's. b. Tx with Novantrone, Tysabri, Copaxone previously.  Marland Kitchen Hypertension   . Depression   . Glaucoma   . Cardiomyopathy     a.  Echo 04/29/12: Mild LVH, EF 20-25%, mild AI, moderate MR, moderate LAE, mild RAE, mild RVE, moderate TR, PASP 51, small pericardial effusion;   b. probably non-ischemic given multiple chemo-Tx agents used for MS and global LV dysfn on echo  . Chronic systolic heart failure   . CHF (congestive heart failure)   . Shortness of breath   . Neuromuscular disorder     MS   Past Surgical History  Procedure Laterality Date  . Ablation      uterine  . Cesarean section     Family History  Problem Relation Age of Onset  . Hypertension Mother   . Heart attack Neg Hx   . Cancer Mother     breast   . Cancer Father     prostate  . Multiple sclerosis Sister    History  Substance Use Topics   . Smoking status: Former Research scientist (life sciences)  . Smokeless tobacco: Never Used  . Alcohol Use: No   OB History    No data available     Review of Systems  Constitutional: Negative for fever and chills.  HENT: Negative for congestion.   Eyes: Negative for visual disturbance.  Respiratory: Negative for shortness of breath.   Cardiovascular: Negative for chest pain.  Gastrointestinal: Positive for nausea and abdominal pain. Negative for vomiting and diarrhea.  Genitourinary: Negative for dysuria and flank pain.  Musculoskeletal: Negative for back pain, neck pain and neck stiffness.  Skin: Negative for rash.  Neurological: Negative for light-headedness and headaches.      Allergies  Sulfonamide derivatives and Penicillins  Home Medications   Prior to Admission medications   Medication Sig Start Date End Date Taking? Authorizing Provider  amantadine (SYMMETREL) 100 MG capsule Take 100 mg by mouth 2 (two) times daily.    Yes Historical Provider, MD  baclofen (LIORESAL) 10 MG tablet Take 1 tablet (10 mg total) by mouth 3 (three) times daily. Patient taking differently: Take 10 mg by mouth 3 (three) times daily. Muscle spasms 09/27/14  Yes Pieter Partridge, DO  carvedilol (COREG) 25 MG tablet Take 1 tablet (25 mg total) by mouth 2 (two) times daily with a meal. 02/22/14  Yes Verlee Monte, MD  Cholecalciferol (VITAMIN D-3 PO) Take 1 tablet by  mouth daily.   Yes Historical Provider, MD  Cyanocobalamin (VITAMIN B-12 PO) Take 1 tablet by mouth daily.   Yes Historical Provider, MD  docusate sodium (COLACE) 100 MG capsule Take 100 mg by mouth 2 (two) times daily.   Yes Historical Provider, MD  DULoxetine (CYMBALTA) 60 MG capsule Take 60 mg by mouth daily.   Yes Historical Provider, MD  furosemide (LASIX) 80 MG tablet Take 1 tablet (80 mg total) by mouth 2 (two) times daily. Take 160 mg every morning and 80 mg po q HS Patient taking differently: Take 80-160 mg by mouth 2 (two) times daily. 160mg  by mouth every  morning and 80mg  by mouth every evening. 02/22/14  Yes Verlee Monte, MD  gabapentin (NEURONTIN) 300 MG capsule Take 3 capsules (900 mg total) by mouth 3 (three) times daily. 09/27/14  Yes Adam Telford Nab, DO  gabapentin (NEURONTIN) 600 MG tablet Take 600 mg by mouth 3 (three) times daily.   Yes Historical Provider, MD  imipramine (TOFRANIL) 50 MG tablet Take 50 mg by mouth every evening.    Yes Historical Provider, MD  latanoprost (XALATAN) 0.005 % ophthalmic solution Place 1 drop into both eyes at bedtime.   Yes Historical Provider, MD  magnesium hydroxide (MILK OF MAGNESIA) 400 MG/5ML suspension Take 30 mLs by mouth every 8 (eight) hours as needed for mild constipation or moderate constipation.   Yes Historical Provider, MD  Multiple Vitamin (MULTIVITAMIN WITH MINERALS) TABS tablet Take 1 tablet by mouth daily.   Yes Historical Provider, MD  natalizumab (TYSABRI) 300 MG/15ML injection Inject 15 mLs (300 mg total) into the vein once. Patient taking differently: Inject 300 mg into the vein every 30 (thirty) days.  07/24/14  Yes Gildardo Cranker, DO  oxyCODONE-acetaminophen (PERCOCET) 7.5-325 MG per tablet Take one tablet by mouth three times daily for pain. Do not exceed 3gm of APAP from all sources/24hr Patient taking differently: Take 1 tablet by mouth 3 (three) times daily. (for pain) Do not exceed 3gm of APAP from all sources/24hr 08/11/14  Yes Estill Dooms, MD  phenazopyridine (PYRIDIUM) 100 MG tablet Take 100 mg by mouth 3 (three) times daily as needed for pain (bladder spasms).   Yes Historical Provider, MD  potassium chloride (KLOR-CON) 20 MEQ packet Take 40 mEq by mouth daily.   Yes Historical Provider, MD  solifenacin (VESICARE) 10 MG tablet Take 10 mg by mouth daily.    Yes Historical Provider, MD  spironolactone (ALDACTONE) 25 MG tablet Take 25 mg by mouth daily.   Yes Historical Provider, MD  zolpidem (AMBIEN) 5 MG tablet Take 1 tablet (5 mg total) by mouth at bedtime as needed for sleep. 02/23/14   Yes Estill Dooms, MD  cephALEXin (KEFLEX) 500 MG capsule Take 1 capsule (500 mg total) by mouth 2 (two) times daily. 10/05/14   Elnora Morrison, MD  oxybutynin (DITROPAN) 5 MG tablet Take 1 tablet (5 mg total) by mouth every 8 (eight) hours as needed for bladder spasms. 06/27/14   Dorie Rank, MD  potassium chloride SA (K-DUR,KLOR-CON) 20 MEQ tablet Take 2 tablets (40 mEq total) by mouth daily. 02/10/14   Scott T Weaver, PA-C   BP 123/52 mmHg  Pulse 70  Temp(Src) 98.1 F (36.7 C) (Oral)  Resp 15  SpO2 97% Physical Exam  Constitutional: She is oriented to person, place, and time. She appears well-developed and well-nourished.  HENT:  Head: Normocephalic and atraumatic.  Dry mm  Eyes: Conjunctivae are normal. Right eye exhibits no  discharge. Left eye exhibits no discharge.  Neck: Normal range of motion. Neck supple. No tracheal deviation present.  Cardiovascular: Normal rate and regular rhythm.   Pulmonary/Chest: Effort normal and breath sounds normal.  Abdominal: Soft. She exhibits no distension. There is tenderness (mild tenderness suprapubic and left/right lower abdomen no peritonitis). There is no guarding.  Musculoskeletal: She exhibits no edema.  Neurological: She is alert and oriented to person, place, and time.  Skin: Skin is warm. No rash noted.  Psychiatric: She has a normal mood and affect.  Nursing note and vitals reviewed.   ED Course  Procedures (including critical care time) Labs Review Labs Reviewed  CBC WITH DIFFERENTIAL/PLATELET - Abnormal; Notable for the following:    Hemoglobin 10.8 (*)    HCT 32.2 (*)    RDW 15.7 (*)    Eosinophils Relative 13 (*)    All other components within normal limits  COMPREHENSIVE METABOLIC PANEL - Abnormal; Notable for the following:    Potassium 3.3 (*)    BUN 24 (*)    Alkaline Phosphatase 120 (*)    GFR calc non Af Amer 63 (*)    GFR calc Af Amer 73 (*)    All other components within normal limits  URINALYSIS, ROUTINE W REFLEX  MICROSCOPIC - Abnormal; Notable for the following:    Color, Urine ORANGE (*)    APPearance CLOUDY (*)    Hgb urine dipstick MODERATE (*)    Nitrite POSITIVE (*)    Leukocytes, UA MODERATE (*)    All other components within normal limits  URINE MICROSCOPIC-ADD ON - Abnormal; Notable for the following:    Squamous Epithelial / LPF MANY (*)    Bacteria, UA MANY (*)    Casts HYALINE CASTS (*)    All other components within normal limits  URINE CULTURE  LIPASE, BLOOD    Imaging Review No results found.   EKG Interpretation None      MDM   Final diagnoses:  Suprapubic pain, unspecified laterality  UTI (lower urinary tract infection)  Anemia Hypokalemia  Patient presents with worsening lower abdominal pain concern for urine infection versus colitis/diverticulitis with worsening pain for 1 week. Plan for an and CT scan if unremarkable. Patient improved on recheck him a no abdominal pain. Cath urine concerning for infection with suprapubic pain CT scan canceled and plan for treatment for urine infection close follow-up outpatient.  Results and differential diagnosis were discussed with the patient/parent/guardian. Close follow up outpatient was discussed, comfortable with the plan.   Medications  cefTRIAXone (ROCEPHIN) 1 g in dextrose 5 % 50 mL IVPB (not administered)  HYDROmorphone (DILAUDID) injection 1 mg (1 mg Intravenous Given 10/05/14 0753)  0.9 %  sodium chloride infusion ( Intravenous New Bag/Given 10/05/14 0753)  iohexol (OMNIPAQUE) 300 MG/ML solution 50 mL (50 mLs Oral Contrast Given 10/05/14 0744)    Filed Vitals:   10/05/14 0648 10/05/14 0908  BP: 122/73 123/52  Pulse: 72 70  Temp: 98.1 F (36.7 C)   TempSrc: Oral   Resp: 16 15  SpO2: 97% 97%    Final diagnoses:  Suprapubic pain, unspecified laterality  UTI (lower urinary tract infection)        Elnora Morrison, MD 10/05/14 [phone]

## 2014-10-05 NOTE — Discharge Instructions (Signed)
If you were given medicines take as directed.  If you are on coumadin or contraceptives realize their levels and effectiveness is altered by many different medicines.  If you have any reaction (rash, tongues swelling, other) to the medicines stop taking and see a physician.   Please follow up as directed and return to the ER or see a physician for new or worsening symptoms.  Thank you. Filed Vitals:   10/05/14 0648 10/05/14 0908  BP: 122/73 123/52  Pulse: 72 70  Temp: 98.1 F (36.7 C)   TempSrc: Oral   Resp: 16 15  SpO2: 97% 97%

## 2014-10-05 NOTE — ED Notes (Signed)
Awaiting ptar for d/c

## 2014-10-05 NOTE — ED Notes (Signed)
Bed: WA01 Expected date:  Expected time:  Means of arrival:  Comments: EMS 

## 2014-10-05 NOTE — ED Notes (Signed)
Per EMS- from [facility]. Reports sharp lower abdominal pain x1 week. Says "the nursing staff won't do anything about it." Denies N/V/D. Pain is intermittent and described as "piercing." VS: BP 134/90 HR 96 RR 20. Rates pain 10/10.

## 2014-10-08 ENCOUNTER — Ambulatory Visit (HOSPITAL_COMMUNITY)
Admission: RE | Admit: 2014-10-08 | Discharge: 2014-10-08 | Disposition: A | Discharge status: Home / Self Care | Payer: Medicare Other | Source: Ambulatory Visit | Attending: Neurology | Admitting: Neurology

## 2014-10-08 DIAGNOSIS — I509 Heart failure, unspecified: Secondary | ICD-10-CM | POA: Insufficient documentation

## 2014-10-08 DIAGNOSIS — G319 Degenerative disease of nervous system, unspecified: Secondary | ICD-10-CM | POA: Insufficient documentation

## 2014-10-08 DIAGNOSIS — I429 Cardiomyopathy, unspecified: Secondary | ICD-10-CM | POA: Insufficient documentation

## 2014-10-08 DIAGNOSIS — R262 Difficulty in walking, not elsewhere classified: Secondary | ICD-10-CM | POA: Diagnosis not present

## 2014-10-08 DIAGNOSIS — G35 Multiple sclerosis: Secondary | ICD-10-CM

## 2014-10-08 DIAGNOSIS — I10 Essential (primary) hypertension: Secondary | ICD-10-CM | POA: Insufficient documentation

## 2014-10-08 LAB — URINE CULTURE: Colony Count: >=100000

## 2014-10-08 MED ORDER — GADOBENATE DIMEGLUMINE 529 MG/ML IV SOLN
20.0000 mL | Freq: Once | INTRAVENOUS | Status: AC | PRN
  Administered 2014-10-08: 20 mL via INTRAVENOUS

## 2014-10-09 NOTE — Progress Notes (Signed)
ED Antimicrobial Stewardship Positive Culture Follow Up   [name] is an 54 y.o. female who presented to [facility] on 10/05/2014 with a chief complaint of  Chief Complaint  Patient presents with  . Abdominal Pain    Recent Results (from the past 720 hour(s))  Urine culture     Status: None   Collection Time: 10/05/14  7:54 AM  Result Value Ref Range Status   Specimen Description URINE, CATHETERIZED  Final   Special Requests NONE  Final   Colony Count   Final    >=100,000 COLONIES/ML Performed at [facility]    Culture   Final    ENTEROCOCCUS SPECIES Performed at [facility]    Report Status 10/08/2014 FINAL  Final   Organism ID, Bacteria ENTEROCOCCUS SPECIES  Final      Susceptibility   Enterococcus species - KIRBY BAUER*    AMPICILLIN SENSITIVE Sensitive     LEVOFLOXACIN RESISTANT Resistant     NITROFURANTOIN SENSITIVE Sensitive     VANCOMYCIN SENSITIVE Sensitive     * ENTEROCOCCUS SPECIES    [x]  Treated with Keflex, organism not covered by the prescribed antimicrobial  New antibiotic prescription: Macrobid 100 mg po bid x 7 days  ED Provider: Waynetta Pean, PA-C  Lawson Radar 10/09/2014, 5:53 PM Infectious Diseases Pharmacist Phone# [phone]

## 2014-10-10 ENCOUNTER — Telehealth: Payer: Self-pay | Admitting: Emergency Medicine

## 2014-10-10 NOTE — Telephone Encounter (Signed)
Post ED Visit - Positive Culture Follow-up: Successful Patient Follow-Up  Culture assessed and recommendations reviewed by: []  Wes [place], Pharm.D., BCPS []  Heide Guile, Pharm.D., BCPS [x]  Alycia Rossetti, Pharm.D., BCPS []  [place], [place].D., BCPS, AAHIVP []  Legrand Como, Pharm.D., BCPS, AAHIVP []  Hassie Bruce, Pharm.D. []  Milus Glazier, Pharm.D.  Positive urine culture  []  Patient discharged without antimicrobial prescription and treatment is now indicated [x]  Organism is resistant to prescribed ED discharge antimicrobial []  Patient with positive blood cultures  Changes discussed with ED provider: Will Dansie PA New antibiotic prescription Macrobid 100 mg BID x seven days, D/C keflex Faxed to [facility] [phone]   pt resident @ [place]. Hinton Dyer RN @ [facility] notified of positive urine culture and need for change in antibiotic. Urine culture result with new med order faxed to [facility] [phone].  Ernesta Amble 10/10/2014, 12:17 PM

## 2014-10-16 NOTE — Progress Notes (Signed)
Patient ID: Shannon Garrett, female   DOB: 1961-02-07, 54 y.o.   MRN: [id number]  [facility]     Allergies  Allergen Reactions  . Sulfonamide Derivatives Hives and Shortness Of Breath  . Penicillins Hives and Itching       Chief Complaint  Patient presents with  . Medical Management of Chronic Issues    HPI:  She is a resident of this facility being seen for the management of her chronic illnesses. Overall her status is without change. She is not voicing any complaints or concerns today. There are no nursing concerns today.    Past Medical History  Diagnosis Date  . MS (multiple sclerosis)     a. Dx'd late 20's. b. Tx with Novantrone, Tysabri, Copaxone previously.  Marland Kitchen Hypertension   . Depression   . Glaucoma   . Cardiomyopathy     a.  Echo 04/29/12: Mild LVH, EF 20-25%, mild AI, moderate MR, moderate LAE, mild RAE, mild RVE, moderate TR, PASP 51, small pericardial effusion;   b. probably non-ischemic given multiple chemo-Tx agents used for MS and global LV dysfn on echo  . Chronic systolic heart failure   . CHF (congestive heart failure)   . Shortness of breath   . Neuromuscular disorder     MS    Past Surgical History  Procedure Laterality Date  . Ablation      uterine  . Cesarean section      VITAL SIGNS BP 108/66 mmHg  Pulse 87  Ht 5\' 5"  (1.651 m)  Wt 205 lb (92.987 kg)  BMI 34.11 kg/m2  SpO2 97%   Outpatient Encounter Prescriptions as of 09/16/2014  Medication Sig   amantadine 100 mg  Take 100 mg daily    Baclofen 10 mg  Take 10 mg nightly    Colace 100 mg  Take twice daily   . carvedilol (COREG) 25 MG tablet Take 1 tablet (25 mg total) by mouth 2 (two) times daily with a meal.   cymbalta 60 mg  Take daily   . Cholecalciferol (VITAMIN D-3 PO) Take 1 tablet by mouth daily. 1000 units   . Cyanocobalamin (VITAMIN B-12 PO) Take 1 tablet by mouth daily.  . furosemide (LASIX) 80 MG tablet  160mg  by mouth every morning and 80mg  by mouth every evening.   Gabapentin  600 mg three times daily   . imipramine (TOFRANIL) 50 MG tablet Take 50 mg by mouth every evening.   . latanoprost (XALATAN) 0.005 % ophthalmic solution Place 1 drop into both eyes at bedtime.  . Multiple Vitamin (MULTIVITAMIN WITH MINERALS) TABS tablet Take 1 tablet by mouth daily.  . natalizumab (TYSABRI) 300 MG/15ML injection  Inject 300 mg into the vein every 30 (thirty) days.   Marland Kitchen oxybutynin (DITROPAN) xl 5 MG tablet Take 1 tablet (5 mg total) daily   . oxyCODONE-acetaminophen   (PERCOCET) 7.5-325 MG per tablet  Take 1 tablet by mouth 3 (three) times daily.  . potassium chloride SA (K-DUR,KLOR-CON) 20 MEQ tablet Take 2 tablets (40 mEq total) by mouth daily.  . solifenacin (VESICARE) 10 MG tablet Take 10 mg by mouth daily.   Marland Kitchen spironolactone (ALDACTONE) 25 MG tablet Take 25 mg by mouth daily.  Marland Kitchen zolpidem (AMBIEN) 5 MG tablet Take 1 tablet (5 mg total) by mouth at bedtime as needed for sleep.     SIGNIFICANT DIAGNOSTIC EXAMS    LABS REVIEWED:   07-16-14: wbc 5.3; hgb 11.6; hct 34.0; mcv 80.8 ;plt 273; glucose 86;  bun 13; creat 1.01; k+3.9; na++140 08-14-14: tsh 2.03     Review of Systems  Constitutional: Positive for malaise/fatigue.  Respiratory: Negative for cough and shortness of breath.   Cardiovascular: Negative for chest pain, palpitations and leg swelling.  Gastrointestinal: Negative for abdominal pain and constipation.  Musculoskeletal: Positive for myalgias and joint pain.       Pain is managed   Skin: Negative.   Psychiatric/Behavioral: The patient is not nervous/anxious.      Physical Exam  Constitutional: She is oriented to person, place, and time. No distress.  Overweight   Neck: Neck supple. No JVD present. No thyromegaly present.  Cardiovascular: Normal rate, regular rhythm and intact distal pulses.   Respiratory: Effort normal and breath sounds normal. No respiratory distress.  GI: Soft. Bowel sounds are normal. She exhibits no distension. There  is no tenderness.  Musculoskeletal: She exhibits no edema.  Has limited range of motion in all extremities Unable to stand  Neurological: She is alert and oriented to person, place, and time.  Skin: Skin is warm and dry. She is not diaphoretic.       ASSESSMENT/ PLAN:  1. MS: no significant change in her status: has paraplegia  will continue amantadine 100 mg daily baclofen 10 mg nightly for muscle spasms; tysabri 300 mg IV monthly and will monitor her status.   2. Hypertension: will continue coreg 25 mg twice daily and will monitor   3. Chronic combined systolic and diastolic heart failure: is stable will continue lasix 160 mg in the am and 80 mg in the pm; with k+ 40 meq daily; will continue aldactone 25 mg daily   4. Neurogenic bladder: takes oxybutrin xl 5 mg daily and vesicare 10 mg daily   5. Depression: will continue cymbalta 60 mg daily and ambien 5 mg nightly as needed for sleep   6. Chronic pain: her pain is presently being managed; will continue cymbalta 60 mg daily; tofrinil 50 mg nightly; neurontin 600 mg three times daily; percocet 7.5/325 mg three times daily     Ok Edwards NP [facility]  Contact [phone] Monday through Friday 8am- 5pm  After hours call [phone]

## 2014-10-17 DIAGNOSIS — G8929 Other chronic pain: Secondary | ICD-10-CM | POA: Insufficient documentation

## 2014-10-21 ENCOUNTER — Non-Acute Institutional Stay (SKILLED_NURSING_FACILITY): Payer: Medicare Other | Admitting: Adult Health

## 2014-10-21 DIAGNOSIS — I10 Essential (primary) hypertension: Secondary | ICD-10-CM | POA: Diagnosis not present

## 2014-10-21 DIAGNOSIS — K5900 Constipation, unspecified: Secondary | ICD-10-CM

## 2014-10-21 DIAGNOSIS — F32A Depression, unspecified: Secondary | ICD-10-CM

## 2014-10-21 DIAGNOSIS — F329 Major depressive disorder, single episode, unspecified: Secondary | ICD-10-CM

## 2014-10-21 DIAGNOSIS — G8929 Other chronic pain: Secondary | ICD-10-CM | POA: Diagnosis not present

## 2014-10-21 DIAGNOSIS — N319 Neuromuscular dysfunction of bladder, unspecified: Secondary | ICD-10-CM

## 2014-10-21 DIAGNOSIS — I5042 Chronic combined systolic (congestive) and diastolic (congestive) heart failure: Secondary | ICD-10-CM

## 2014-10-21 DIAGNOSIS — G35 Multiple sclerosis: Secondary | ICD-10-CM | POA: Diagnosis not present

## 2014-10-21 DIAGNOSIS — K5909 Other constipation: Secondary | ICD-10-CM

## 2014-10-25 ENCOUNTER — Other Ambulatory Visit: Payer: Self-pay | Admitting: *Deleted

## 2014-10-25 DIAGNOSIS — G35 Multiple sclerosis: Secondary | ICD-10-CM

## 2014-11-01 ENCOUNTER — Other Ambulatory Visit: Payer: Self-pay | Admitting: *Deleted

## 2014-11-01 MED ORDER — OXYCODONE-ACETAMINOPHEN 7.5-325 MG PO TABS: ORAL_TABLET | ORAL | Status: DC

## 2014-11-01 NOTE — Telephone Encounter (Signed)
[facility]

## 2014-11-02 ENCOUNTER — Encounter: Payer: Self-pay | Admitting: *Deleted

## 2014-11-17 ENCOUNTER — Other Ambulatory Visit: Payer: Self-pay

## 2014-11-17 MED ORDER — OXYCODONE-ACETAMINOPHEN 7.5-325 MG PO TABS: ORAL_TABLET | ORAL | Status: DC

## 2014-11-17 NOTE — Telephone Encounter (Signed)
RX faxed to [facility] @ [phone], phone number [phone] 

## 2014-11-19 ENCOUNTER — Other Ambulatory Visit: Payer: Self-pay | Admitting: *Deleted

## 2014-11-19 MED ORDER — OXYCODONE-ACETAMINOPHEN 7.5-325 MG PO TABS: ORAL_TABLET | ORAL | Status: DC

## 2014-11-19 NOTE — Telephone Encounter (Signed)
[facility] 

## 2014-11-22 ENCOUNTER — Other Ambulatory Visit: Payer: Self-pay | Admitting: *Deleted

## 2014-11-22 MED ORDER — ZOLPIDEM TARTRATE 5 MG PO TABS: 5.0000 mg | ORAL_TABLET | Freq: Every evening | ORAL | Status: DC | PRN

## 2014-11-22 NOTE — Telephone Encounter (Signed)
[facility] 

## 2014-11-26 ENCOUNTER — Encounter: Payer: Self-pay | Admitting: Adult Health

## 2014-11-26 DIAGNOSIS — F329 Major depressive disorder, single episode, unspecified: Secondary | ICD-10-CM | POA: Insufficient documentation

## 2014-11-26 DIAGNOSIS — K5909 Other constipation: Secondary | ICD-10-CM | POA: Insufficient documentation

## 2014-11-26 DIAGNOSIS — F32A Depression, unspecified: Secondary | ICD-10-CM | POA: Insufficient documentation

## 2014-11-26 NOTE — Progress Notes (Signed)
Patient ID: Shannon Garrett, female   DOB: [birth year], 54 y.o.   MRN: [id number]  [facility]     Allergies  Allergen Reactions  . Sulfonamide Derivatives Hives and Shortness Of Breath  . Penicillins Hives and Itching       Chief Complaint  Patient presents with  . Medical Management of Chronic Issues    HPI:  She is a long term resident of this facility being seen for the management of her chronic illnesses. She was treated for a uti recently which has resolved is now having vaginal yeast. She is complaining of constipation. Overall her status remains without change. There are no nursing concerns at this time.    Past Medical History  Diagnosis Date  . MS (multiple sclerosis)     a. Dx'd late 20's. b. Tx with Novantrone, Tysabri, Copaxone previously.  Marland Kitchen Hypertension   . Depression   . Glaucoma   . Cardiomyopathy     a.  Echo 04/29/12: Mild LVH, EF 20-25%, mild AI, moderate MR, moderate LAE, mild RAE, mild RVE, moderate TR, PASP 51, small pericardial effusion;   b. probably non-ischemic given multiple chemo-Tx agents used for MS and global LV dysfn on echo  . Chronic systolic heart failure   . CHF (congestive heart failure)   . Shortness of breath   . Neuromuscular disorder     MS    Past Surgical History  Procedure Laterality Date  . Ablation      uterine  . Cesarean section      VITAL SIGNS BP 120/64 mmHg  Pulse 72  Ht 5\' 5"  (1.651 m)  Wt 210 lb (95.255 kg)  BMI 34.95 kg/m2  SpO2 97%   Outpatient Encounter Prescriptions as of 10/21/2014  Medication Sig  . amantadine (SYMMETREL) 100 MG capsule Take 100 mg by mouth daily.   . baclofen (LIORESAL) 10 MG tablet  Take 10 mg by mouth 4 (four) times daily. Muscle spasms)  . carvedilol (COREG) 25 MG tablet Take 1 tablet (25 mg total) by mouth 2 (two) times daily with a meal.  . Cholecalciferol (VITAMIN D-3 PO) Take 1 tablet by mouth daily.  . Cyanocobalamin (VITAMIN B-12 PO) Take 1 tablet by mouth daily.  Marland Kitchen docusate  sodium (COLACE) 100 MG capsule Take 100 mg by mouth 2 (two) times daily.  . DULoxetine (CYMBALTA) 60 MG capsule Take 60 mg by mouth daily.  . furosemide (LASIX) 80 MG tablet  160mg  by mouth every morning and 80mg  by mouth every evening.  . gabapentin (NEURONTIN) 300 MG capsule Take 3 capsules (900 mg total) by mouth 3 (three) times daily.  Marland Kitchen imipramine (TOFRANIL) 50 MG tablet Take 50 mg by mouth every evening. For depressive disorder  . latanoprost (XALATAN) 0.005 % ophthalmic solution Place 1 drop into both eyes at bedtime.  . magnesium hydroxide (MILK OF MAGNESIA) 400 MG/5ML suspension Take 30 mLs by mouth every 8 (eight) hours as needed for mild constipation or moderate constipation.  . Multiple Vitamin (MULTIVITAMIN WITH MINERALS) TABS tablet Take 1 tablet by mouth daily.  . natalizumab (TYSABRI) 300 MG/15ML injection Inject 15 mLs  Inject 300 mg into the vein every 30 (thirty) days.   Marland Kitchen oxyCODONE-acetaminophen (PERCOCET) 7.5-325 MG per tablet Take one tablet by mouth three times daily for pain.   . phenazopyridine (PYRIDIUM) 100 MG tablet Take 100 mg by mouth 3 (three) times daily as needed for pain (bladder spasms).  . potassium chloride (KLOR-CON) 20 MEQ packet Take 40 mEq by  mouth daily.  . solifenacin (VESICARE) 10 MG tablet Take 10 mg by mouth daily. For bladder spasms  . spironolactone (ALDACTONE) 25 MG tablet Take 25 mg by mouth daily. For CHF  . zolpidem (AMBIEN) 5 MG tablet Take 1 tablet (5 mg total) by mouth at bedtime as needed for sleep.      SIGNIFICANT DIAGNOSTIC EXAMS   LABS REVIEWED:   07-16-14: wbc 5.3; hgb 11.6; hct 34.0; mcv 80.8 ;plt 273; glucose 86; bun 13; creat 1.01; k+3.9; na++140 08-14-14: tsh 2.03  09-28-14: wbc 5.7 ;hgb 10.0; hct 31.6; mcv 84.5; plt 323; glucose 87; bun 16.4; creat 1.00; k+3.5; na++142; liver normal albumin 3.7; vit d 33.41     ROS Constitutional: Positive for malaise/fatigue.  Respiratory: Negative for cough and shortness of breath.     Cardiovascular: Negative for chest pain, palpitations and leg swelling.  Gastrointestinal: Negative for abdominal pain has constipation.  Musculoskeletal: Positive for myalgias and joint pain.       Pain is managed   Skin: Negative.  has vaginal yeast  Psychiatric/Behavioral: The patient is not nervous/anxious.     Physical Exam Constitutional: She is oriented to person, place, and time. No distress.  Overweight   Neck: Neck supple. No JVD present. No thyromegaly present.  Cardiovascular: Normal rate, regular rhythm and intact distal pulses.   Respiratory: Effort normal and breath sounds normal. No respiratory distress.  GI: Soft. Bowel sounds are normal. She exhibits no distension. There is no tenderness.  Musculoskeletal: She exhibits no edema.  Has limited range of motion in all extremities Unable to stand  Neurological: She is alert and oriented to person, place, and time.  Skin: Skin is warm and dry. She is not diaphoretic.  Has yeast vaginal drainage        ASSESSMENT/ PLAN:   1. MS: no significant change in her status: has paraplegia  will continue amantadine 100 mg daily baclofen 10 mg four times daily for muscle spasms; tysabri 300 mg IV monthly and will monitor her status. Is due to see Dr. Tomi Likens in July 2016  2. Hypertension: will continue coreg 25 mg twice daily and will monitor   3. Chronic combined systolic and diastolic heart failure: is stable will continue lasix 160 mg in the am and 80 mg in the pm; with k+ 40 meq daily; will continue aldactone 25 mg daily   4. Neurogenic bladder: takes vesicare 10 mg daily   5. Depression: will continue cymbalta 60 mg daily and ambien 5 mg nightly as needed for sleep   6. Chronic pain: her pain is presently being managed; will continue cymbalta 60 mg daily; tofrinil 50 mg nightly; neurontin 900 mg three times daily; percocet 7.5/325 mg three times daily   7. Vaginal yeast: diflucan 150 mg po one time  8. Constipation:  will begin miralax daily      Ok Edwards NP [facility]  Contact [phone] Monday through Friday 8am- 5pm  After hours call [phone]

## 2014-11-30 ENCOUNTER — Other Ambulatory Visit: Payer: Self-pay | Admitting: *Deleted

## 2014-11-30 MED ORDER — OXYCODONE-ACETAMINOPHEN 7.5-325 MG PO TABS: ORAL_TABLET | ORAL | Status: DC

## 2014-11-30 NOTE — Telephone Encounter (Signed)
[facility] 

## 2014-12-09 ENCOUNTER — Non-Acute Institutional Stay (SKILLED_NURSING_FACILITY): Payer: Medicare Other | Admitting: Adult Health

## 2014-12-09 DIAGNOSIS — G35 Multiple sclerosis: Secondary | ICD-10-CM

## 2014-12-09 DIAGNOSIS — G8220 Paraplegia, unspecified: Secondary | ICD-10-CM

## 2014-12-09 DIAGNOSIS — I5042 Chronic combined systolic (congestive) and diastolic (congestive) heart failure: Secondary | ICD-10-CM

## 2014-12-09 DIAGNOSIS — K5900 Constipation, unspecified: Secondary | ICD-10-CM

## 2014-12-09 DIAGNOSIS — F329 Major depressive disorder, single episode, unspecified: Secondary | ICD-10-CM

## 2014-12-09 DIAGNOSIS — G8929 Other chronic pain: Secondary | ICD-10-CM

## 2014-12-09 DIAGNOSIS — F32A Depression, unspecified: Secondary | ICD-10-CM

## 2014-12-09 DIAGNOSIS — K5909 Other constipation: Secondary | ICD-10-CM

## 2014-12-09 DIAGNOSIS — N319 Neuromuscular dysfunction of bladder, unspecified: Secondary | ICD-10-CM

## 2014-12-10 ENCOUNTER — Telehealth: Payer: Self-pay | Admitting: Neurology

## 2014-12-10 NOTE — Telephone Encounter (Signed)
Debbie Reynolds called from [facility] in regards to this pt/Dawn CB# [phone]

## 2014-12-13 NOTE — Telephone Encounter (Signed)
Please advise on note below.   

## 2014-12-13 NOTE — Telephone Encounter (Signed)
Spoke with Shannon Garrett she is following up on labs drawn in March per Dr. Starr Lake note she needs to know if she would be continuing her Iv infusions please advise

## 2014-12-13 NOTE — Telephone Encounter (Signed)
I spoke with Lonzo Candy regarding this patient she should be seeing Dr Felecia Shelling at this time .

## 2014-12-13 NOTE — Telephone Encounter (Signed)
This patient was supposed to follow up with Dr. Felecia Shelling, as he was her long-term neurologist.

## 2014-12-15 ENCOUNTER — Non-Acute Institutional Stay (SKILLED_NURSING_FACILITY): Payer: Medicare Other | Admitting: Adult Health

## 2014-12-15 DIAGNOSIS — G8929 Other chronic pain: Secondary | ICD-10-CM

## 2014-12-15 DIAGNOSIS — G35 Multiple sclerosis: Secondary | ICD-10-CM

## 2014-12-27 ENCOUNTER — Other Ambulatory Visit: Payer: Self-pay | Admitting: *Deleted

## 2014-12-27 MED ORDER — OXYCODONE-ACETAMINOPHEN 7.5-325 MG PO TABS: ORAL_TABLET | ORAL | Status: DC

## 2014-12-27 NOTE — Telephone Encounter (Signed)
[facility] 

## 2015-01-05 ENCOUNTER — Non-Acute Institutional Stay (SKILLED_NURSING_FACILITY): Payer: Medicare Other | Admitting: Adult Health

## 2015-01-05 ENCOUNTER — Encounter: Payer: Self-pay | Admitting: Adult Health

## 2015-01-05 DIAGNOSIS — G35 Multiple sclerosis: Secondary | ICD-10-CM

## 2015-01-05 MED ORDER — IMIPRAMINE HCL 50 MG PO TABS: 75.0000 mg | ORAL_TABLET | Freq: Every evening | ORAL | Status: DC

## 2015-01-05 MED ORDER — POLYETHYLENE GLYCOL 3350 17 G PO PACK: 17.0000 g | PACK | Freq: Two times a day (BID) | ORAL | Status: DC

## 2015-01-05 NOTE — Progress Notes (Signed)
Patient ID: Shannon Garrett, female   DOB: 07-24-1960, 54 y.o.   MRN: [id number]  [facility]     Allergies  Allergen Reactions  . Sulfonamide Derivatives Hives and Shortness Of Breath  . Penicillins Hives and Itching       Chief Complaint  Patient presents with  . Medical Management of Chronic Issues    HPI:  She is a long term resident of this facility being seen for the management of her chronic illnesses. She continues to have pain management issues. She is due to restart her tysabri injections as she has seen neurology. She does complain of constipation. There are no nursing concerns at this time.      Past Medical History  Diagnosis Date  . MS (multiple sclerosis)     a. Dx'd late 20's. b. Tx with Novantrone, Tysabri, Copaxone previously.  Marland Kitchen Hypertension   . Depression   . Glaucoma   . Cardiomyopathy     a.  Echo 04/29/12: Mild LVH, EF 20-25%, mild AI, moderate MR, moderate LAE, mild RAE, mild RVE, moderate TR, PASP 51, small pericardial effusion;   b. probably non-ischemic given multiple chemo-Tx agents used for MS and global LV dysfn on echo  . Chronic systolic heart failure   . CHF (congestive heart failure)   . Shortness of breath   . Neuromuscular disorder     MS    Past Surgical History  Procedure Laterality Date  . Ablation      uterine  . Cesarean section      VITAL SIGNS BP 132/64 mmHg  Pulse 76  Ht 5\' 5"  (1.651 m)  Wt 216 lb (97.977 kg)  BMI 35.94 kg/m2  SpO2 98%   Outpatient Encounter Prescriptions as of 12/09/2014  Medication Sig  . amantadine (SYMMETREL) 100 MG capsule Take 100 mg by mouth daily.   . baclofen (LIORESAL) 10 MG tablet Take 1 tablet (10 mg total) by mouth 3 (three) times daily.  . carvedilol (COREG) 25 MG tablet Take 1 tablet (25 mg total) by mouth 2 (two) times daily with a meal.  . Cholecalciferol (VITAMIN D-3 PO) Take 1 tablet by mouth daily.  . Cyanocobalamin (VITAMIN B-12 PO) Take 1 tablet by mouth daily.  Marland Kitchen docusate  sodium (COLACE) 100 MG capsule Take 100 mg by mouth 2 (two) times daily.  . DULoxetine (CYMBALTA) 60 MG capsule Take 60 mg by mouth daily.  . furosemide (LASIX) 80 MG tablet Take 1 tablet (80 mg total) by mouth 2 (two) times daily. Take 160 mg every morning and 80 mg po q HS (Patient taking differently: Take 80-160 mg by mouth 2 (two) times daily. 160mg  by mouth every morning and 80mg  by mouth every evening.)  . gabapentin (NEURONTIN) 300 MG capsule Take 3 capsules (900 mg total) by mouth 3 (three) times daily.  Marland Kitchen imipramine (TOFRANIL) 50 MG tablet Take 50 mg by mouth every evening. For depressive disorder  . latanoprost (XALATAN) 0.005 % ophthalmic solution Place 1 drop into both eyes at bedtime.  . magnesium hydroxide (MILK OF MAGNESIA) 400 MG/5ML suspension Take 30 mLs by mouth every 8 (eight) hours as needed for mild constipation or moderate constipation.  . Multiple Vitamin (MULTIVITAMIN WITH MINERALS) TABS tablet Take 1 tablet by mouth daily.  Marland Kitchen oxyCODONE-acetaminophen (PERCOCET) 7.5-325 MG per tablet Take one tablet by mouth three times daily for pain. Not to exceed 3gm from all sources/24h  . phenazopyridine (PYRIDIUM) 100 MG tablet Take 100 mg by mouth 3 (three) times  daily as needed for pain (bladder spasms).  . polyethylene glycol (MIRALAX / GLYCOLAX) packet Take 17 g by mouth daily.  . potassium chloride (KLOR-CON) 20 MEQ packet Take 40 mEq by mouth daily.  . solifenacin (VESICARE) 10 MG tablet Take 10 mg by mouth daily. For bladder spasms  . spironolactone (ALDACTONE) 25 MG tablet Take 25 mg by mouth daily. For CHF  . zolpidem (AMBIEN) 5 MG tablet Take 1 tablet (5 mg total) by mouth at bedtime as needed for sleep.  . natalizumab (TYSABRI) 300 MG/15ML injection Inject 15 mLs (300 mg total) into the vein once. (Patient not taking: Reported on 12/09/2014)      SIGNIFICANT DIAGNOSTIC EXAMS  10-08-14: MRI brain: No significant change in diffuse white matter lesions consistent with patient's  history of multiple sclerosis. None of these areas demonstrate abnormal restriction or enhancement as may be seen with areas of acute demyelination. Global atrophy. 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 Right parotid 1 cm lesion does not have typical appearance of an intra parotid lymph node and therefore primary parotid lesion is a possibility. ENT consultation may be considered for further delineation.  10-08-14: mri of cervical; spine: Scattered new areas of altered signal intensity (compared to 2007) throughout the cervical cord as detailed above consistent with patient's history of multiple sclerosis. None of these areas demonstrate enhancement as may be seen with areas of active demyelination. Cervical spondylotic changes as detailed above most prominent C5-6 level.   LABS REVIEWED:   07-16-14: wbc 5.3; hgb 11.6; hct 34.0; mcv 80.8 ;plt 273; glucose 86; bun 13; creat 1.01; k+3.9; na++140 08-14-14: tsh 2.03  09-28-14: wbc 5.7 ;hgb 10.0; hct 31.6; mcv 84.5; plt 323; glucose 87; bun 16.4; creat 1.00; k+3.5; na++142; liver normal albumin 3.7; vit d 33.41 10-05-14: wbc 5.0; hgb 10.8; hct 32.2; mcv 82.8; plt 309; bun 94; bun 24; creat 1.02; k+3.3; na++139; liver normal albumin 3.7; urine culture: enterococcus     ROS Constitutional: Positive for malaise/fatigue.  Respiratory: Negative for cough and shortness of breath.   Cardiovascular: Negative for chest pain, palpitations and leg swelling.  Gastrointestinal: Negative for abdominal pain has constipation.  Musculoskeletal: Positive for myalgias and joint pain.       Pain is  NOT managed   Skin: Negative. Has ulcer right outer ankle  Psychiatric/Behavioral: The patient is not nervous/anxious.     Physical Exam Constitutional: She is oriented to person, place, and time. No distress.  Overweight   Neck: Neck supple. No JVD present. No thyromegaly present.  Cardiovascular:  Normal rate, regular rhythm and intact distal pulses.   Respiratory: Effort normal and breath sounds normal. No respiratory distress.  GI: Soft. Bowel sounds are normal. She exhibits no distension. There is no tenderness.  Musculoskeletal: She exhibits no edema.  Has limited range of motion in all extremities Unable to stand  Neurological: She is alert and oriented to person, place, and time.  Skin: Skin is warm and dry. She is not diaphoretic.  Small ulceration right outer ankle is resolving no drainage present.       ASSESSMENT/ PLAN:  1. MS: no significant change in her status: has paraplegia  will continue amantadine 100 mg daily baclofen 10 mg three times daily for muscle spasms; is currently not taking Tysabri facility will follow up with neurology to set up injections  2. Hypertension: will continue coreg 25 mg twice daily  and will monitor   3. Chronic combined systolic and diastolic heart failure: is stable will continue lasix 160 mg in the am and 80 mg in the pm; with k+ 40 meq daily; will continue aldactone 25 mg daily   4. Neurogenic bladder: takes vesicare 10 mg daily and take pyridium 100 mg three times daily   5. Depression: will continue cymbalta 60 mg daily and ambien 5 mg nightly as needed for sleep   6. Chronic pain: she is currently having pain:  will continue cymbalta 60 mg daily;; neurontin 900 mg three times daily; percocet 7.5/325 mg three times daily will increase tofranil to 75 mg nightly   9. Constipation: will increase  miralax to twice  daily      Ok Edwards NP [facility]  Contact [phone] Monday through Friday 8am- 5pm  After hours call [phone]

## 2015-01-05 NOTE — Progress Notes (Signed)
Patient ID: Shannon Garrett, female   DOB: 1961-05-16, 54 y.o.   MRN: [id number]  [facility]     Allergies  Allergen Reactions  . Sulfonamide Derivatives Hives and Shortness Of Breath  . Penicillins Hives and Itching       Chief Complaint  Patient presents with  . Acute Visit    pain management     HPI:  She continues to have pain management issues. She is not getting any type of relief with her current regimen. She has yet to start the injections for her MS. She is getting weaker; states that she is more lethargic and is having increased difficult time focusing on her conversations.   Past Medical History  Diagnosis Date  . MS (multiple sclerosis)     a. Dx'd late 20's. b. Tx with Novantrone, Tysabri, Copaxone previously.  Marland Kitchen Hypertension   . Depression   . Glaucoma   . Cardiomyopathy     a.  Echo 04/29/12: Mild LVH, EF 20-25%, mild AI, moderate MR, moderate LAE, mild RAE, mild RVE, moderate TR, PASP 51, small pericardial effusion;   b. probably non-ischemic given multiple chemo-Tx agents used for MS and global LV dysfn on echo  . Chronic systolic heart failure   . CHF (congestive heart failure)   . Shortness of breath   . Neuromuscular disorder     MS    Past Surgical History  Procedure Laterality Date  . Ablation      uterine  . Cesarean section      VITAL SIGNS BP 124/60 mmHg  Pulse 70  Ht 5\' 5"  (1.651 m)  Wt 216 lb (97.977 kg)  BMI 35.94 kg/m2  SpO2 99%   Outpatient Encounter Prescriptions as of 12/15/2014  Medication Sig  . amantadine (SYMMETREL) 100 MG capsule Take 100 mg by mouth daily.   . baclofen (LIORESAL) 10 MG tablet Take 1 tablet (10 mg total) by mouth 3 (three) times daily.  . carvedilol (COREG) 25 MG tablet Take 1 tablet (25 mg total) by mouth 2 (two) times daily with a meal.  . Cholecalciferol (VITAMIN D-3 PO) Take 1 tablet by mouth daily.  . Cyanocobalamin (VITAMIN B-12 PO) Take 1 tablet by mouth daily.  Marland Kitchen docusate sodium (COLACE) 100 MG  capsule Take 100 mg by mouth 2 (two) times daily.  . DULoxetine (CYMBALTA) 60 MG capsule Take 60 mg by mouth daily.  . furosemide (LASIX) 80 MG tablet Take 1 tablet (80 mg total) by mouth 2 (two) times daily. Take 160 mg every morning and 80 mg po q HS (Patient taking differently: Take 80-160 mg by mouth 2 (two) times daily. 160mg  by mouth every morning and 80mg  by mouth every evening.)  . gabapentin (NEURONTIN) 300 MG capsule Take 3 capsules (900 mg total) by mouth 3 (three) times daily.  Marland Kitchen imipramine (TOFRANIL) 50 MG tablet Take 1.5 tablets (75 mg total) by mouth every evening. For depressive disorder  . latanoprost (XALATAN) 0.005 % ophthalmic solution Place 1 drop into both eyes at bedtime.  . magnesium hydroxide (MILK OF MAGNESIA) 400 MG/5ML suspension Take 30 mLs by mouth every 8 (eight) hours as needed for mild constipation or moderate constipation.  . Multiple Vitamin (MULTIVITAMIN WITH MINERALS) TABS tablet Take 1 tablet by mouth daily.  . natalizumab (TYSABRI) 300 MG/15ML injection Inject 15 mLs (300 mg total) into the vein once. (Patient not taking: Reported on 12/09/2014)  . phenazopyridine (PYRIDIUM) 100 MG tablet Take 100 mg by mouth 3 (three) times daily  as needed for pain (bladder spasms).  . polyethylene glycol (MIRALAX / GLYCOLAX) packet Take 17 g by mouth 2 (two) times daily.  . potassium chloride (KLOR-CON) 20 MEQ packet Take 40 mEq by mouth daily.  . solifenacin (VESICARE) 10 MG tablet Take 10 mg by mouth daily. For bladder spasms  . spironolactone (ALDACTONE) 25 MG tablet Take 25 mg by mouth daily. For CHF  . zolpidem (AMBIEN) 5 MG tablet Take 1 tablet (5 mg total) by mouth at bedtime as needed for sleep.      SIGNIFICANT DIAGNOSTIC EXAMS   10-08-14: MRI brain: No significant change in diffuse white matter lesions consistent with patient's history of multiple sclerosis. None of these areas demonstrate abnormal restriction or enhancement as may be seen with areas of acute  demyelination. Global atrophy. 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 Right parotid 1 cm lesion does not have typical appearance of an intra parotid lymph node and therefore primary parotid lesion is a possibility. ENT consultation may be considered for further delineation.  10-08-14: mri of cervical; spine: Scattered new areas of altered signal intensity (compared to 2007) throughout the cervical cord as detailed above consistent with patient's history of multiple sclerosis. None of these areas demonstrate enhancement as may be seen with areas of active demyelination. Cervical spondylotic changes as detailed above most prominent C5-6 level.  12-13-14: ABI: right: mild atherosclerotic disease    LABS REVIEWED:   07-16-14: wbc 5.3; hgb 11.6; hct 34.0; mcv 80.8 ;plt 273; glucose 86; bun 13; creat 1.01; k+3.9; na++140 08-14-14: tsh 2.03  09-28-14: wbc 5.7 ;hgb 10.0; hct 31.6; mcv 84.5; plt 323; glucose 87; bun 16.4; creat 1.00; k+3.5; na++142; liver normal albumin 3.7; vit d 33.41 10-05-14: wbc 5.0; hgb 10.8; hct 32.2; mcv 82.8; plt 309; bun 94; bun 24; creat 1.02; k+3.3; na++139; liver normal albumin 3.7; urine culture: enterococcus      ROS Constitutional: Positive for malaise/fatigue.  Respiratory: Negative for cough and shortness of breath.   Cardiovascular: Negative for chest pain, palpitations and leg swelling.  Gastrointestinal: Negative for abdominal pain has constipation.  Musculoskeletal: Positive for myalgias and joint pain.       Pain is  NOT managed   Skin: Negative. Has ulcer right outer ankle  Psychiatric/Behavioral: is lethargic difficulty focusing       Physical Exam Constitutional: She is oriented to person, place, and time. No distress.  Overweight   Neck: Neck supple. No JVD present. No thyromegaly present.  Cardiovascular: Normal rate, regular rhythm and intact distal pulses.   Respiratory: Effort  normal and breath sounds normal. No respiratory distress.  GI: Soft. Bowel sounds are normal. She exhibits no distension. There is no tenderness.  Musculoskeletal: She exhibits no edema.  Has limited range of motion in all extremities Unable to stand  Neurological: She is alert .  Skin: Skin is warm and dry. She is not diaphoretic.  Small ulceration right outer ankle is resolving no drainage present.       ASSESSMENT/ PLAN:   1. MS: no significant change in her status: has paraplegia  will continue amantadine 100 mg daily baclofen 10 mg three times daily for muscle spasms; is currently not taking Tysabri facility will follow up with neurology to set up injections    6. Chronic pain: she is currently having pain:  will continue cymbalta 60 mg daily; neurontin 900 mg three times daily; percocet  7.5/325 mg three times daily will continue tofranil  75 mg nightly   Will begin prednisone 60 mg daily for 7 days Will make the percocet every 8 hours for better coverage of her pain needs Will check cbc; cmp and sed rate Will have her return to neurology as soon as possible.    Ok Edwards NP [facility]  Contact [phone] Monday through Friday 8am- 5pm  After hours call [phone]

## 2015-01-06 ENCOUNTER — Non-Acute Institutional Stay (SKILLED_NURSING_FACILITY): Payer: Medicare Other | Admitting: Internal Medicine

## 2015-01-06 DIAGNOSIS — I10 Essential (primary) hypertension: Secondary | ICD-10-CM | POA: Diagnosis not present

## 2015-01-06 DIAGNOSIS — I5042 Chronic combined systolic (congestive) and diastolic (congestive) heart failure: Secondary | ICD-10-CM | POA: Diagnosis not present

## 2015-01-06 DIAGNOSIS — K5909 Other constipation: Secondary | ICD-10-CM

## 2015-01-06 DIAGNOSIS — K5900 Constipation, unspecified: Secondary | ICD-10-CM | POA: Diagnosis not present

## 2015-01-06 DIAGNOSIS — G8220 Paraplegia, unspecified: Secondary | ICD-10-CM | POA: Diagnosis not present

## 2015-01-06 DIAGNOSIS — F332 Major depressive disorder, recurrent severe without psychotic features: Secondary | ICD-10-CM | POA: Diagnosis not present

## 2015-01-06 DIAGNOSIS — G894 Chronic pain syndrome: Secondary | ICD-10-CM

## 2015-01-06 DIAGNOSIS — G35 Multiple sclerosis: Secondary | ICD-10-CM

## 2015-01-07 ENCOUNTER — Encounter: Payer: Self-pay | Admitting: Neurology

## 2015-01-07 ENCOUNTER — Ambulatory Visit (INDEPENDENT_AMBULATORY_CARE_PROVIDER_SITE_OTHER): Payer: Medicare Other | Admitting: Neurology

## 2015-01-07 VITALS — BP 118/60 | HR 70 | Resp 18 | Ht 63.0 in | Wt 195.0 lb

## 2015-01-07 DIAGNOSIS — G4700 Insomnia, unspecified: Secondary | ICD-10-CM | POA: Diagnosis not present

## 2015-01-07 DIAGNOSIS — G35 Multiple sclerosis: Secondary | ICD-10-CM

## 2015-01-07 DIAGNOSIS — F329 Major depressive disorder, single episode, unspecified: Secondary | ICD-10-CM | POA: Diagnosis not present

## 2015-01-07 DIAGNOSIS — G8220 Paraplegia, unspecified: Secondary | ICD-10-CM

## 2015-01-07 DIAGNOSIS — N3946 Mixed incontinence: Secondary | ICD-10-CM | POA: Diagnosis not present

## 2015-01-07 DIAGNOSIS — G8929 Other chronic pain: Secondary | ICD-10-CM

## 2015-01-07 DIAGNOSIS — F32A Depression, unspecified: Secondary | ICD-10-CM

## 2015-01-07 DIAGNOSIS — R32 Unspecified urinary incontinence: Secondary | ICD-10-CM | POA: Insufficient documentation

## 2015-01-07 NOTE — Progress Notes (Addendum)
[facility]  PATIENT: Shannon Garrett DOB: Feb 10, 1961  REFERRING DOCTOR OR PCP:  Gildardo Cranker SOURCE: Patient, records from EMR, MRI images on PACS.  _________________________________   HISTORICAL  CHIEF COMPLAINT:  Chief Complaint  Patient presents with  . Multiple Sclerosis    Former pt. of Dr. Garth Bigness from [facility], treated with Tysabri and Novantrone.  She sts. after Novantrone she developed CHF.  She was last seen by Dr. Shellee Milo. she has not seen a neurologist since he moved.  Sts. he was treating her with Tysabri, but she has not had an infusion in at least 6 mos.  She is currently a resident at [facility]. facility here in [place].  Phone # [phone].  She was sent here today with only a demographic sheet, no med or allergy list.  I have spoken with   . Gait Disturbance    [facility] and requested this info.  Sts. she is not able to walk at all--until recently she was able to stand and pivot to help with transfers.  Difficulty with incontinence and left sided (from shoulder to foot) pain.  She would like to discuss resuming Tysabri infusions/fim    HISTORY OF PRESENT ILLNESS:  I had the pleasure of seeing your patient, Shannon Garrett, at [facility] for neurologic referral regarding her multiple sclerosis.  I last saw her at [facility] neurology about 4 years ago.  MS history:  Records from [facility] and Dr. Jacqulynn Cadet are not available.   She was diagnosed with multiple sclerosis in the 1980s after presenting with optic neuritis, numbness in her legs and vertigo MRI was consistent with MS. Initially she was not placed on any medication as none were approved. She had multiple exacerbations and by the 1990s was having difficulty with her gait initially she was placed on Avonex and remain on that medication for many years.   She thinks I first saw her about 8 years ago.   She thinks she was switched to Tysabri but continued to have  some progression. Therefore, Novantrone was tried.   She feels that Novantrone did slow her progression down significantly. However, this needed to be stopped after she developed congestive heart failure.  She switched her care to Dr. Jacqulynn Cadet and went back on Tysabri therapy but stopped seeing him when he moved to the [place] area about 1-1/2 years ago.  However, she continue Tysabri into 2015.  She saw Dr. Tomi Likens once earlier this year.   A JCV antibody test was performed but I don't have those results.   I'll try to get old records to confirm the above history.  Gait/strength/sensation: She is unable to walk and has been bedbound for about a year. When I last saw her, she was able to take some steps with a walker and transferred easily however, she did spend most of her time in her electric chair. He has weakness in all extremities, worse on the left associated with spasticity that is also worse on the left.  She denies much numbness and occasionally will get some tingling in the left arm and leg.  Bladder:   She notes bladder spasms that are only partially helped by oxybutynin. She has urinary incontinence. She wears diapers at her current nursing home.  Vision: She denies any major problem with her vision.  Fatigue/sleep: She feels tired most days. She has difficulty falling asleep and staying asleep. The sleep onset insomnia is worse than the sleep maintenance.  Mood/cognition: She notes depression but  no anxiety. She gets frustrated easily. She denies any major problems with cognition but feels she was able to think and process information better in the past.  Pain:  She reports pain in all 4 limbs and in her back. The pain is worse in the left arm. Percocet helped mildly.  I personally reviewed MRI images of the brain dated 10/08/2014 and 09/06/2014 and MRI cervical spine 10/08/2014.    I concur with the official readings.  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     REVIEW OF SYSTEMS: Constitutional: No fevers, chills, sweats, or change in appetite.  Notes fatigue and insomnia Eyes: No visual changes, double vision, eye pain Ear, nose and throat: No hearing loss, ear pain, nasal congestion, sore throat Cardiovascular: No chest pain, palpitations Respiratory: No shortness of breath at rest or with exertion.   No wheezes GastrointestinaI: No nausea, vomiting, diarrhea, abdominal pain, fecal incontinence Genitourinary: as above Musculoskeletal: Some neck pain, back pain Integumentary: No rash, pruritus, skin lesions Neurological: as above Psychiatric: as above Endocrine: No palpitations, diaphoresis, change in appetite, change in weigh or increased thirst Hematologic/Lymphatic: No anemia, purpura, petechiae. Allergic/Immunologic: No itchy/runny eyes, nasal congestion, recent allergic reactions, rashes  ALLERGIES: Allergies  Allergen Reactions  . Sulfonamide Derivatives Hives and Shortness Of Breath  . Penicillins Hives and Itching    HOME MEDICATIONS:  Current outpatient prescriptions:  .  amantadine (SYMMETREL) 100 MG capsule, Take 100 mg by mouth daily. , Disp: , Rfl:  .  baclofen (LIORESAL) 10 MG tablet, Take 1 tablet (10 mg total) by mouth 3 (three) times daily., Disp: 90 each, Rfl: 5 .  carvedilol (COREG) 25 MG tablet, Take 1 tablet (25 mg total) by mouth 2 (two) times daily with a meal., Disp: , Rfl:  .  Cholecalciferol (VITAMIN D-3 PO), Take 1 tablet by mouth daily., Disp: , Rfl:  .  Cyanocobalamin (VITAMIN B-12 PO), Take 1 tablet by mouth daily., Disp: , Rfl:  .  docusate sodium (COLACE) 100 MG capsule, Take 100 mg by mouth 2 (two) times daily., Disp: , Rfl:  .  DULoxetine (CYMBALTA) 60 MG capsule, Take 60 mg by mouth daily., Disp: , Rfl:  .  furosemide (LASIX) 80 MG tablet, Take 1 tablet (80 mg total) by mouth 2 (two) times daily. Take 160 mg every morning and 80 mg po q HS  (Patient taking differently: Take 80-160 mg by mouth 2 (two) times daily. 160mg  by mouth every morning and 80mg  by mouth every evening.), Disp: , Rfl:  .  gabapentin (NEURONTIN) 300 MG capsule, Take 3 capsules (900 mg total) by mouth 3 (three) times daily., Disp: 270 capsule, Rfl: 5 .  imipramine (TOFRANIL) 50 MG tablet, Take 1.5 tablets (75 mg total) by mouth every evening. For depressive disorder, Disp: 90 tablet, Rfl: 11 .  latanoprost (XALATAN) 0.005 % ophthalmic solution, Place 1 drop into both eyes at bedtime., Disp: , Rfl:  .  magnesium hydroxide (MILK OF MAGNESIA) 400 MG/5ML suspension, Take 30 mLs by mouth every 8 (eight) hours as needed for mild constipation or moderate constipation., Disp: , Rfl:  .  Multiple Vitamin (MULTIVITAMIN WITH MINERALS) TABS tablet, Take 1 tablet by mouth daily., Disp: , Rfl:  .  natalizumab (TYSABRI) 300 MG/15ML injection, Inject 15 mLs (300 mg total) into the vein once. (Patient not taking: Reported on 01/05/2015), Disp: 15 mL, Rfl:  .  oxyCODONE-acetaminophen (PERCOCET) 7.5-325 MG per tablet, Take one tablet by mouth three times daily for pain. Not to exceed 3gm from all sources/24h, Disp: 90 tablet, Rfl: 0 .  phenazopyridine (PYRIDIUM) 100 MG tablet, Take 100 mg by mouth 3 (three) times daily as needed for pain (bladder spasms)., Disp: , Rfl:  .  polyethylene glycol (MIRALAX / GLYCOLAX) packet, Take 17 g by mouth 2 (two) times daily., Disp: 120 packet, Rfl: 11 .  potassium chloride (KLOR-CON) 20 MEQ packet, Take 40 mEq by mouth daily., Disp: , Rfl:  .  solifenacin (VESICARE) 10 MG tablet, Take 10 mg by mouth daily. For bladder spasms, Disp: , Rfl:  .  spironolactone (ALDACTONE) 25 MG tablet, Take 25 mg by mouth daily. For CHF, Disp: , Rfl:  .  zolpidem (AMBIEN) 5 MG tablet, Take 1 tablet (5 mg total) by mouth at bedtime as needed for sleep., Disp: 30 tablet, Rfl: 5  PAST MEDICAL HISTORY: Past Medical History  Diagnosis Date  . MS (multiple sclerosis)     a.  Dx'd late 20's. b. Tx with Novantrone, Tysabri, Copaxone previously.  Marland Kitchen Hypertension   . Depression   . Glaucoma   . Cardiomyopathy     a.  Echo 04/29/12: Mild LVH, EF 20-25%, mild AI, moderate MR, moderate LAE, mild RAE, mild RVE, moderate TR, PASP 51, small pericardial effusion;   b. probably non-ischemic given multiple chemo-Tx agents used for MS and global LV dysfn on echo  . Chronic systolic heart failure   . CHF (congestive heart failure)   . Shortness of breath   . Neuromuscular disorder     MS    PAST SURGICAL HISTORY: Past Surgical History  Procedure Laterality Date  . Ablation      uterine  . Cesarean section      FAMILY HISTORY: Family History  Problem Relation Age of Onset  . Hypertension Mother   . Heart attack Neg Hx   . Cancer Mother     breast   . Cancer Father     prostate  . Multiple sclerosis Sister     SOCIAL HISTORY:  History   Social History  . Marital Status: Single    Spouse Name: N/A  . Number of Children: N/A  . Years of Education: N/A   Occupational History  . Not on file.   Social History Main Topics  . Smoking status: Former Research scientist (life sciences)  . Smokeless tobacco: Never Used  . Alcohol Use: No  . Drug Use: No  . Sexual Activity: Not Currently   Other Topics Concern  . Not on file   Social History Narrative     PHYSICAL EXAM  Filed Vitals:   01/07/15 1015  BP: 118/60  Pulse: 70  Resp: 18  Height: 5\' 3"  (1.6 m)  Weight: 195 lb (88.451 kg)    Body mass index is 34.55 kg/(m^2).   General: The patient is well-developed and well-nourished and in no acute distress  Eyes:  Funduscopic exam shows normal optic discs and retinal vessels.  Neck: The neck is supple, no carotid bruits are noted.  The neck is nontender.  Cardiovascular: The heart has a regular rate and rhythm with a normal S1 and S2. There were no murmurs, gallops or rubs.    Skin: Extremities show minimal ankle edema.    Neurologic Exam  Mental status: The  patient is alert and oriented x 3 at the time of the examination. The patient has apparent normal recent and remote memory,  with an apparently normal attention span and concentration ability.   Speech is normal.  Cranial nerves: Extraocular movements are full. Pupils are equal, round, and reactive to light and accomodation.  Visual fields are full.  Facial symmetry is present. There is good facial sensation to soft touch bilaterally.Facial strength is normal.  Trapezius and sternocleidomastoid strength is normal. No dysarthria is noted.  The tongue is midline, and the patient has symmetric elevation of the soft palate. No obvious hearing deficits are noted.  Motor:  Muscle bulk is normal.   Tone is increase in the legs greater than the arms, left worse than right. Ankle is 4+/5 in the right arm, 4 minus/5 in the left arm, 2/5 in the right leg and 2 minus/5 in the left leg..   Sensory: Sensory testing is intact to touch and vibration sensation in all 4 extremities.  Coordination: Cerebellar testing reveals good left and poor right finger-nose-finger bilaterally.  Gait and station: she is bedridden.   Reflexes: Deep tendon reflexes are increased, left more than right.    DIAGNOSTIC DATA (LABS, IMAGING, TESTING) - I reviewed patient records, labs, notes, testing and imaging myself where available.  Lab Results  Component Value Date   WBC 5.0 10/05/2014   HGB 10.8* 10/05/2014   HCT 32.2* 10/05/2014   MCV 82.8 10/05/2014   PLT 309 10/05/2014      Component Value Date/Time   NA 139 10/05/2014 0720   NA 142 09/28/2014   K 3.3* 10/05/2014 0720   CL 99 10/05/2014 0720   CO2 29 10/05/2014 0720   GLUCOSE 94 10/05/2014 0720   BUN 24* 10/05/2014 0720   BUN 16 09/28/2014   CREATININE 1.02 10/05/2014 0720   CALCIUM 9.2 10/05/2014 0720   PROT 7.2 10/05/2014 0720   ALBUMIN 3.7 10/05/2014 0720   AST 16 10/05/2014 0720   ALT 13 10/05/2014 0720   ALKPHOS 120* 10/05/2014 0720   BILITOT 0.3  10/05/2014 0720   GFRNONAA 63* 10/05/2014 0720   GFRAA 73* 10/05/2014 0720   Lab Results  Component Value Date   CHOL 145 05/01/2012   HDL 23* 05/01/2012   LDLCALC 108* 05/01/2012   TRIG 72 05/01/2012   CHOLHDL 6.3 05/01/2012   No results found for: HGBA1C No results found for: VITAMINB12 Lab Results  Component Value Date   TSH 5.220* 02/20/2014       ASSESSMENT AND PLAN  MULTIPLE SCLEROSIS, PROGRESSIVE/RELAPSING  Paraplegia  Chronic pain  Depression  Insomnia   In summary, Shannon Garrett is a 53 year oldwoman with a relapsing form of secondary progressive MS with multiple neurologic impairments as detailed above. I had her sign a service request form and we will try to get her back on Tysabri therapy. To help with her spasticity and pain, I will increase the baclofen to 20 mg by mouth 3 times a day and increase her Percocet pills from 7.5 to 10. Additionally, she has a lot of insomnia and I will increase the Ambien to 10 mg. I'll ask her nursing home to get her out of bed to a Geri chair daily.  She will return in about 4 months or sooner if she has new or worsening neurologic symptoms.   Salimata Christenson A. Felecia Shelling, MD, PhD 8/0/9983, 38:25 AM Certified in Neurology, Clinical Neurophysiology, Sleep Medicine, Pain Medicine and Neuroimaging  [facility] [address], [place], [place] [address] [phone]

## 2015-01-10 ENCOUNTER — Emergency Department (HOSPITAL_COMMUNITY): Payer: Medicare Other

## 2015-01-10 ENCOUNTER — Encounter (HOSPITAL_COMMUNITY): Payer: Self-pay

## 2015-01-10 ENCOUNTER — Other Ambulatory Visit: Payer: Self-pay | Admitting: *Deleted

## 2015-01-10 ENCOUNTER — Inpatient Hospital Stay (HOSPITAL_COMMUNITY)
Admission: EM | Admit: 2015-01-10 | Discharge: 2015-01-17 | DRG: 071 | Disposition: A | Discharge status: Skilled Nursing Facility | Payer: Medicare Other | Attending: Internal Medicine | Admitting: Internal Medicine

## 2015-01-10 DIAGNOSIS — Z882 Allergy status to sulfonamides status: Secondary | ICD-10-CM

## 2015-01-10 DIAGNOSIS — R945 Abnormal results of liver function studies: Secondary | ICD-10-CM | POA: Diagnosis present

## 2015-01-10 DIAGNOSIS — R7989 Other specified abnormal findings of blood chemistry: Secondary | ICD-10-CM | POA: Diagnosis present

## 2015-01-10 DIAGNOSIS — G35 Multiple sclerosis: Secondary | ICD-10-CM | POA: Diagnosis present

## 2015-01-10 DIAGNOSIS — I959 Hypotension, unspecified: Secondary | ICD-10-CM | POA: Diagnosis present

## 2015-01-10 DIAGNOSIS — F329 Major depressive disorder, single episode, unspecified: Secondary | ICD-10-CM | POA: Diagnosis present

## 2015-01-10 DIAGNOSIS — Z880 Allergy status to penicillin: Secondary | ICD-10-CM

## 2015-01-10 DIAGNOSIS — G9340 Encephalopathy, unspecified: Secondary | ICD-10-CM | POA: Diagnosis not present

## 2015-01-10 DIAGNOSIS — L89152 Pressure ulcer of sacral region, stage 2: Secondary | ICD-10-CM | POA: Diagnosis present

## 2015-01-10 DIAGNOSIS — L89502 Pressure ulcer of unspecified ankle, stage 2: Secondary | ICD-10-CM | POA: Diagnosis present

## 2015-01-10 DIAGNOSIS — R32 Unspecified urinary incontinence: Secondary | ICD-10-CM | POA: Diagnosis present

## 2015-01-10 DIAGNOSIS — I420 Dilated cardiomyopathy: Secondary | ICD-10-CM | POA: Diagnosis present

## 2015-01-10 DIAGNOSIS — R4182 Altered mental status, unspecified: Secondary | ICD-10-CM | POA: Diagnosis present

## 2015-01-10 DIAGNOSIS — Z79891 Long term (current) use of opiate analgesic: Secondary | ICD-10-CM

## 2015-01-10 DIAGNOSIS — R471 Dysarthria and anarthria: Secondary | ICD-10-CM | POA: Diagnosis present

## 2015-01-10 DIAGNOSIS — B952 Enterococcus as the cause of diseases classified elsewhere: Secondary | ICD-10-CM | POA: Diagnosis present

## 2015-01-10 DIAGNOSIS — H409 Unspecified glaucoma: Secondary | ICD-10-CM | POA: Diagnosis present

## 2015-01-10 DIAGNOSIS — M21372 Foot drop, left foot: Secondary | ICD-10-CM | POA: Diagnosis present

## 2015-01-10 DIAGNOSIS — I429 Cardiomyopathy, unspecified: Secondary | ICD-10-CM | POA: Diagnosis present

## 2015-01-10 DIAGNOSIS — I5042 Chronic combined systolic (congestive) and diastolic (congestive) heart failure: Secondary | ICD-10-CM | POA: Diagnosis present

## 2015-01-10 DIAGNOSIS — R6510 Systemic inflammatory response syndrome (SIRS) of non-infectious origin without acute organ dysfunction: Secondary | ICD-10-CM | POA: Diagnosis present

## 2015-01-10 DIAGNOSIS — Z79899 Other long term (current) drug therapy: Secondary | ICD-10-CM

## 2015-01-10 DIAGNOSIS — A419 Sepsis, unspecified organism: Secondary | ICD-10-CM

## 2015-01-10 DIAGNOSIS — K5900 Constipation, unspecified: Secondary | ICD-10-CM | POA: Diagnosis present

## 2015-01-10 DIAGNOSIS — G8929 Other chronic pain: Secondary | ICD-10-CM | POA: Diagnosis present

## 2015-01-10 DIAGNOSIS — E860 Dehydration: Secondary | ICD-10-CM | POA: Diagnosis present

## 2015-01-10 DIAGNOSIS — R410 Disorientation, unspecified: Secondary | ICD-10-CM | POA: Diagnosis present

## 2015-01-10 DIAGNOSIS — E861 Hypovolemia: Secondary | ICD-10-CM | POA: Diagnosis present

## 2015-01-10 DIAGNOSIS — N3289 Other specified disorders of bladder: Secondary | ICD-10-CM | POA: Diagnosis present

## 2015-01-10 DIAGNOSIS — I110 Hypertensive heart disease with heart failure: Secondary | ICD-10-CM | POA: Diagnosis present

## 2015-01-10 DIAGNOSIS — M21371 Foot drop, right foot: Secondary | ICD-10-CM | POA: Diagnosis present

## 2015-01-10 DIAGNOSIS — I10 Essential (primary) hypertension: Secondary | ICD-10-CM | POA: Diagnosis present

## 2015-01-10 DIAGNOSIS — N390 Urinary tract infection, site not specified: Secondary | ICD-10-CM

## 2015-01-10 DIAGNOSIS — L8990 Pressure ulcer of unspecified site, unspecified stage: Secondary | ICD-10-CM | POA: Diagnosis present

## 2015-01-10 DIAGNOSIS — Z87891 Personal history of nicotine dependence: Secondary | ICD-10-CM

## 2015-01-10 DIAGNOSIS — I5032 Chronic diastolic (congestive) heart failure: Secondary | ICD-10-CM | POA: Diagnosis present

## 2015-01-10 LAB — URINALYSIS, ROUTINE W REFLEX MICROSCOPIC
Bilirubin Urine: NEGATIVE
GLUCOSE, UA: NEGATIVE mg/dL
Hgb urine dipstick: NEGATIVE
KETONES UR: NEGATIVE mg/dL
Nitrite: POSITIVE — AB
PH: 5.5 (ref 5.0–8.0)
Protein, ur: NEGATIVE mg/dL
Specific Gravity, Urine: 1.013 (ref 1.005–1.030)
Urobilinogen, UA: 1 mg/dL (ref 0.0–1.0)

## 2015-01-10 LAB — CBC WITH DIFFERENTIAL/PLATELET
BASOS PCT: 0 % (ref 0–1)
Basophils Absolute: 0 10*3/uL (ref 0.0–0.1)
EOS PCT: 6 % — AB (ref 0–5)
Eosinophils Absolute: 0.4 10*3/uL (ref 0.0–0.7)
HEMATOCRIT: 34 % — AB (ref 36.0–46.0)
Hemoglobin: 11.3 g/dL — ABNORMAL LOW (ref 12.0–15.0)
Lymphocytes Relative: 15 % (ref 12–46)
Lymphs Abs: 0.9 10*3/uL (ref 0.7–4.0)
MCH: 28 pg (ref 26.0–34.0)
MCHC: 33.2 g/dL (ref 30.0–36.0)
MCV: 84.2 fL (ref 78.0–100.0)
MONO ABS: 0.5 10*3/uL (ref 0.1–1.0)
MONOS PCT: 8 % (ref 3–12)
Neutro Abs: 4.4 10*3/uL (ref 1.7–7.7)
Neutrophils Relative %: 71 % (ref 43–77)
PLATELETS: 291 10*3/uL (ref 150–400)
RBC: 4.04 MIL/uL (ref 3.87–5.11)
RDW: 15.9 % — ABNORMAL HIGH (ref 11.5–15.5)
WBC: 6.2 10*3/uL (ref 4.0–10.5)

## 2015-01-10 LAB — URINE MICROSCOPIC-ADD ON

## 2015-01-10 LAB — COMPREHENSIVE METABOLIC PANEL
ALT: 19 U/L (ref 14–54)
AST: 36 U/L (ref 15–41)
Albumin: 3.4 g/dL — ABNORMAL LOW (ref 3.5–5.0)
Alkaline Phosphatase: 128 U/L — ABNORMAL HIGH (ref 38–126)
Anion gap: 9 (ref 5–15)
BUN: 21 mg/dL — AB (ref 6–20)
CHLORIDE: 102 mmol/L (ref 101–111)
CO2: 29 mmol/L (ref 22–32)
CREATININE: 0.89 mg/dL (ref 0.44–1.00)
Calcium: 9.2 mg/dL (ref 8.9–10.3)
GFR calc Af Amer: >60 mL/min (ref >60)
Glucose, Bld: 101 mg/dL — ABNORMAL HIGH (ref 65–99)
Potassium: 4.5 mmol/L (ref 3.5–5.1)
SODIUM: 140 mmol/L (ref 135–145)
TOTAL PROTEIN: 6.6 g/dL (ref 6.5–8.1)
Total Bilirubin: 1.3 mg/dL — ABNORMAL HIGH (ref 0.3–1.2)

## 2015-01-10 LAB — I-STAT CG4 LACTIC ACID, ED: LACTIC ACID, VENOUS: 1 mmol/L (ref 0.5–2.0)

## 2015-01-10 LAB — I-STAT TROPONIN, ED: TROPONIN I, POC: 0 ng/mL (ref 0.00–0.08)

## 2015-01-10 MED ORDER — OXYCODONE-ACETAMINOPHEN 10-325 MG PO TABS: ORAL_TABLET | ORAL | Status: DC

## 2015-01-10 MED ORDER — SODIUM CHLORIDE 0.9 % IV BOLUS (SEPSIS)
1000.0000 mL | Freq: Once | INTRAVENOUS | Status: AC
  Administered 2015-01-10: 1000 mL via INTRAVENOUS

## 2015-01-10 MED ORDER — ZOLPIDEM TARTRATE 10 MG PO TABS: ORAL_TABLET | ORAL | Status: DC

## 2015-01-10 MED ORDER — NALOXONE HCL 0.4 MG/ML IJ SOLN
0.4000 mg | Freq: Once | INTRAMUSCULAR | Status: AC
  Administered 2015-01-10: 0.4 mg via INTRAVENOUS
  Filled 2015-01-10: qty 1

## 2015-01-10 MED ORDER — DEXTROSE 5 % IV SOLN
1.0000 g | Freq: Once | INTRAVENOUS | Status: AC
  Administered 2015-01-11: 1 g via INTRAVENOUS
  Filled 2015-01-10: qty 10

## 2015-01-10 NOTE — ED Provider Notes (Signed)
CSN: [id number]     Arrival date & time 01/10/15  2158 History   First MD Initiated Contact with Patient 01/10/15 2201     Chief Complaint  Patient presents with  . Altered Mental Status     (Consider location/radiation/quality/duration/timing/severity/associated sxs/prior Treatment) HPI Comments: Patient with history of MS -- presents from [facility] with altered mental status. Level V caveat 2/2 altered mental status. History obtained from EMS. Patient usually able to respond to questioning, now just repeating same words over and over. Nurse at facility was on duty for 2 hours and she was the one who noticed that patient was not acting like herself. It is unclear when her mental status changed. Patient did receive Percocet earlier in the evening.   Patient is a 54 y.o. female presenting with altered mental status. The history is provided by the patient and medical records.  Altered Mental Status   Past Medical History  Diagnosis Date  . MS (multiple sclerosis)     a. Dx'd late 20's. b. Tx with Novantrone, Tysabri, Copaxone previously.  Marland Kitchen Hypertension   . Depression   . Glaucoma   . Cardiomyopathy     a.  Echo 04/29/12: Mild LVH, EF 20-25%, mild AI, moderate MR, moderate LAE, mild RAE, mild RVE, moderate TR, PASP 51, small pericardial effusion;   b. probably non-ischemic given multiple chemo-Tx agents used for MS and global LV dysfn on echo  . Chronic systolic heart failure   . CHF (congestive heart failure)   . Shortness of breath   . Neuromuscular disorder     MS   Past Surgical History  Procedure Laterality Date  . Ablation      uterine  . Cesarean section     Family History  Problem Relation Age of Onset  . Hypertension Mother   . Heart attack Neg Hx   . Cancer Mother     breast   . Cancer Father     prostate  . Multiple sclerosis Sister    History  Substance Use Topics  . Smoking status: Former Research scientist (life sciences)  . Smokeless tobacco: Never Used  . Alcohol Use: No    OB History    No data available     Review of Systems  Unable to perform ROS: Mental status change      Allergies  Sulfonamide derivatives and Penicillins  Home Medications   Prior to Admission medications   Medication Sig Start Date End Date Taking? Authorizing Provider  amantadine (SYMMETREL) 100 MG capsule Take 100 mg by mouth daily.     Historical Provider, MD  baclofen (LIORESAL) 10 MG tablet Take 1 tablet (10 mg total) by mouth 3 (three) times daily. 09/27/14   Pieter Partridge, DO  carvedilol (COREG) 25 MG tablet Take 1 tablet (25 mg total) by mouth 2 (two) times daily with a meal. 02/22/14   Verlee Monte, MD  Cholecalciferol (VITAMIN D-3 PO) Take 1 tablet by mouth daily.    Historical Provider, MD  Cyanocobalamin (VITAMIN B-12 PO) Take 1 tablet by mouth daily.    Historical Provider, MD  docusate sodium (COLACE) 100 MG capsule Take 100 mg by mouth 2 (two) times daily.    Historical Provider, MD  DULoxetine (CYMBALTA) 60 MG capsule Take 60 mg by mouth daily.    Historical Provider, MD  furosemide (LASIX) 80 MG tablet Take 1 tablet (80 mg total) by mouth 2 (two) times daily. Take 160 mg every morning and 80 mg po q HS  Patient taking differently: Take 80-160 mg by mouth 2 (two) times daily. 160mg  by mouth every morning and 80mg  by mouth every evening. 02/22/14   Verlee Monte, MD  gabapentin (NEURONTIN) 300 MG capsule Take 3 capsules (900 mg total) by mouth 3 (three) times daily. 09/27/14   Pieter Partridge, DO  imipramine (TOFRANIL) 50 MG tablet Take 1.5 tablets (75 mg total) by mouth every evening. For depressive disorder 01/05/15   Gerlene Fee, NP  latanoprost (XALATAN) 0.005 % ophthalmic solution Place 1 drop into both eyes at bedtime.    Historical Provider, MD  magnesium hydroxide (MILK OF MAGNESIA) 400 MG/5ML suspension Take 30 mLs by mouth every 8 (eight) hours as needed for mild constipation or moderate constipation.    Historical Provider, MD  Multiple Vitamin (MULTIVITAMIN WITH  MINERALS) TABS tablet Take 1 tablet by mouth daily.    Historical Provider, MD  natalizumab (TYSABRI) 300 MG/15ML injection Inject 15 mLs (300 mg total) into the vein once. Patient not taking: Reported on 01/05/2015 07/24/14   Gildardo Cranker, DO  oxyCODONE-acetaminophen (PERCOCET) 10-325 MG per tablet Take one tablet by mouth every 6 hours as needed for pain. Not to exceed 3gm APAP from all sources/24hours 01/10/15   Tiffany L Reed, DO  oxyCODONE-acetaminophen (PERCOCET) 7.5-325 MG per tablet Take one tablet by mouth three times daily for pain. Not to exceed 3gm from all sources/24h 12/27/14   Tiffany L Reed, DO  phenazopyridine (PYRIDIUM) 100 MG tablet Take 100 mg by mouth 3 (three) times daily as needed for pain (bladder spasms).    Historical Provider, MD  polyethylene glycol (MIRALAX / GLYCOLAX) packet Take 17 g by mouth 2 (two) times daily. 01/05/15   Gerlene Fee, NP  potassium chloride (KLOR-CON) 20 MEQ packet Take 40 mEq by mouth daily.    Historical Provider, MD  solifenacin (VESICARE) 10 MG tablet Take 10 mg by mouth daily. For bladder spasms    Historical Provider, MD  spironolactone (ALDACTONE) 25 MG tablet Take 25 mg by mouth daily. For CHF    Historical Provider, MD  zolpidem (AMBIEN) 10 MG tablet Take one tablet by mouth at bedtime for rest 01/10/15   Tiffany L Reed, DO  zolpidem (AMBIEN) 5 MG tablet Take 1 tablet (5 mg total) by mouth at bedtime as needed for sleep. 11/22/14   Tiffany L Reed, DO   BP 73/50 mmHg  Pulse 78  Temp(Src) 98.5 F (36.9 C) (Rectal)  Resp 14  SpO2 89%   Physical Exam  Constitutional: She appears well-developed and well-nourished.  HENT:  Head: Normocephalic and atraumatic.  Mouth/Throat: Oropharynx is clear and moist.  Eyes: Conjunctivae are normal. Right eye exhibits no discharge. Left eye exhibits no discharge.  Bilateral dilated pupils.   Neck: Normal range of motion. Neck supple.  Cardiovascular: Normal rate, regular rhythm and normal heart sounds.    Pulmonary/Chest: Effort normal and breath sounds normal. No respiratory distress. She has no wheezes. She has no rales.  Abdominal: Soft. There is no tenderness. There is no rebound and no guarding.  Musculoskeletal: She exhibits no edema or tenderness.  Previous venous stasis ulcer has healed. No LE edema.   Neurological: She is alert.  Patient rouses to voice. She mumbles repeating garbled speech.   Skin: Skin is warm and dry.  Psychiatric: She is noncommunicative.  Unable to evaluate. Patient is altered.   Nursing note and vitals reviewed.   ED Course  Procedures (including critical care time) Labs Review Labs Reviewed  CBC WITH DIFFERENTIAL/PLATELET - Abnormal; Notable for the following:    Hemoglobin 11.3 (*)    HCT 34.0 (*)    RDW 15.9 (*)    Eosinophils Relative 6 (*)    All other components within normal limits  COMPREHENSIVE METABOLIC PANEL - Abnormal; Notable for the following:    Glucose, Bld 101 (*)    BUN 21 (*)    Albumin 3.4 (*)    Alkaline Phosphatase 128 (*)    Total Bilirubin 1.3 (*)    All other components within normal limits  URINALYSIS, ROUTINE W REFLEX MICROSCOPIC (NOT AT [facility]) - Abnormal; Notable for the following:    Color, Urine ORANGE (*)    Nitrite POSITIVE (*)    Leukocytes, UA SMALL (*)    All other components within normal limits  URINE MICROSCOPIC-ADD ON - Abnormal; Notable for the following:    Bacteria, UA MANY (*)    Casts HYALINE CASTS (*)    All other components within normal limits  CULTURE, BLOOD (ROUTINE X 2)  CULTURE, BLOOD (ROUTINE X 2)  URINE CULTURE  I-STAT CG4 LACTIC ACID, ED  Randolm Idol, ED    Imaging Review Dg Chest Portable 1 View  01/10/2015   CLINICAL DATA:  54 year old female with shortness of breath  EXAM: PORTABLE CHEST - 1 VIEW  COMPARISON:  Chest radiograph dated 05/23/2014  FINDINGS: The heart size and mediastinal contours are within normal limits. Both lungs are clear. The visualized skeletal structures  are unremarkable.  IMPRESSION: No active disease.   Electronically Signed   By: Anner Crete M.D.   On: 01/10/2015 22:33     EKG Interpretation   Date/Time:  Monday January 10 2015 22:25:59 EDT Ventricular Rate:  87 PR Interval:  181 QRS Duration: 96 QT Interval:  380 QTC Calculation: 457 R Axis:   13 Text Interpretation:  Sinus rhythm Nonspecific T wave abnormality Abnormal  ekg Since last tracing rate slower Confirmed by MILLER  MD, BRIAN ([id number])  on 01/10/2015 10:53:35 PM       10:15 PM Patient seen and examined. Work-up initiated. Medications ordered.   Vital signs reviewed and are as follows: BP 73/50 mmHg  Pulse 78  Temp(Src) 98.5 F (36.9 C) (Rectal)  Resp 14  SpO2 89%  11:08 PM Narcan ordered. Seen with Dr. Sabra Heck. Patient has responded to fluid bolus and BP is improved. No change in mental status with Narcan. Friends at bedside confirm that patient is usually conversant.   12:07 AM Patient exam unchanged. He is lying in bed repeating 'Please help me'. Her BP is 122/101.   12:28 AM Spoke with Dr. Humphrey Rolls who will admit.   MDM   Final diagnoses:  Altered mental state  UTI (lower urinary tract infection)   Admit.     Carlisle Cater, PA-C 01/11/15 1517  Noemi Chapel, MD 01/11/15 [phone]

## 2015-01-10 NOTE — ED Provider Notes (Signed)
Pt has hx of severe MS - is bed bound  Presents with hypotension and altered MS On exam is repeating the same words over and over, unable to answer questions BP is 70's, improved with fluids CXR without acute findings Blood work unremarkable UA pending,  Anticipate admission due to altered and hypotension - fluids and abx  Medical screening examination/treatment/procedure(s) were conducted as a shared visit with non-physician practitioner(s) and myself.  I personally evaluated the patient during the encounter.  Clinical Impression:   Final diagnoses:  Altered mental state  UTI (lower urinary tract infection)        EKG Interpretation  Date/Time:  Monday January 10 2015 22:25:59 EDT Ventricular Rate:  87 PR Interval:  181 QRS Duration: 96 QT Interval:  380 QTC Calculation: 457 R Axis:   13 Text Interpretation:  Sinus rhythm Nonspecific T wave abnormality Abnormal ekg Since last tracing rate slower Confirmed by Sabra Heck  MD, Orlondo Holycross ([id number]) on 01/10/2015 10:53:35 PM        Noemi Chapel, MD 01/11/15 1614

## 2015-01-10 NOTE — ED Notes (Signed)
Per EMS, the staff at pt's SNF rounded on her at 2000 and noticed that she was altered and not acting herself.  They gave her her night time percocet and baclofen.  When they rounded on her at 2200, pt was still altered and so they called 911 to have her checked out.  Pt is normally weak on the right side, but she normally is talking and alert and currently is not answering questions.  The staff denies any UTI symptoms, denies fever unknown time of last known well.

## 2015-01-10 NOTE — ED Notes (Signed)
Aura Fey friend of the family-cell: [phone] Home: [phone]. Pt's sister Chelby Salata: [phone] Call these numbers to update on pt status please.

## 2015-01-10 NOTE — Telephone Encounter (Signed)
[facility]

## 2015-01-11 ENCOUNTER — Encounter (HOSPITAL_COMMUNITY): Payer: Self-pay | Admitting: Family Medicine

## 2015-01-11 DIAGNOSIS — G35 Multiple sclerosis: Secondary | ICD-10-CM

## 2015-01-11 DIAGNOSIS — G8929 Other chronic pain: Secondary | ICD-10-CM | POA: Diagnosis present

## 2015-01-11 DIAGNOSIS — I10 Essential (primary) hypertension: Secondary | ICD-10-CM | POA: Diagnosis present

## 2015-01-11 DIAGNOSIS — R32 Unspecified urinary incontinence: Secondary | ICD-10-CM | POA: Diagnosis present

## 2015-01-11 DIAGNOSIS — R4182 Altered mental status, unspecified: Secondary | ICD-10-CM | POA: Diagnosis present

## 2015-01-11 DIAGNOSIS — I5042 Chronic combined systolic (congestive) and diastolic (congestive) heart failure: Secondary | ICD-10-CM | POA: Diagnosis present

## 2015-01-11 DIAGNOSIS — F329 Major depressive disorder, single episode, unspecified: Secondary | ICD-10-CM | POA: Diagnosis present

## 2015-01-11 DIAGNOSIS — I429 Cardiomyopathy, unspecified: Secondary | ICD-10-CM

## 2015-01-11 DIAGNOSIS — N3289 Other specified disorders of bladder: Secondary | ICD-10-CM | POA: Diagnosis present

## 2015-01-11 DIAGNOSIS — N390 Urinary tract infection, site not specified: Secondary | ICD-10-CM | POA: Diagnosis present

## 2015-01-11 DIAGNOSIS — I5032 Chronic diastolic (congestive) heart failure: Secondary | ICD-10-CM | POA: Diagnosis not present

## 2015-01-11 DIAGNOSIS — H409 Unspecified glaucoma: Secondary | ICD-10-CM | POA: Diagnosis present

## 2015-01-11 DIAGNOSIS — Z882 Allergy status to sulfonamides status: Secondary | ICD-10-CM | POA: Diagnosis not present

## 2015-01-11 DIAGNOSIS — G9340 Encephalopathy, unspecified: Secondary | ICD-10-CM | POA: Diagnosis present

## 2015-01-11 DIAGNOSIS — I959 Hypotension, unspecified: Secondary | ICD-10-CM | POA: Diagnosis present

## 2015-01-11 DIAGNOSIS — R7989 Other specified abnormal findings of blood chemistry: Secondary | ICD-10-CM | POA: Diagnosis present

## 2015-01-11 DIAGNOSIS — Z880 Allergy status to penicillin: Secondary | ICD-10-CM | POA: Diagnosis not present

## 2015-01-11 DIAGNOSIS — E860 Dehydration: Secondary | ICD-10-CM | POA: Diagnosis present

## 2015-01-11 DIAGNOSIS — R410 Disorientation, unspecified: Secondary | ICD-10-CM

## 2015-01-11 DIAGNOSIS — A419 Sepsis, unspecified organism: Secondary | ICD-10-CM | POA: Diagnosis not present

## 2015-01-11 DIAGNOSIS — E861 Hypovolemia: Secondary | ICD-10-CM | POA: Diagnosis present

## 2015-01-11 DIAGNOSIS — B952 Enterococcus as the cause of diseases classified elsewhere: Secondary | ICD-10-CM | POA: Diagnosis present

## 2015-01-11 DIAGNOSIS — R6510 Systemic inflammatory response syndrome (SIRS) of non-infectious origin without acute organ dysfunction: Secondary | ICD-10-CM | POA: Diagnosis present

## 2015-01-11 DIAGNOSIS — Z79891 Long term (current) use of opiate analgesic: Secondary | ICD-10-CM | POA: Diagnosis not present

## 2015-01-11 DIAGNOSIS — I420 Dilated cardiomyopathy: Secondary | ICD-10-CM | POA: Diagnosis present

## 2015-01-11 DIAGNOSIS — M21372 Foot drop, left foot: Secondary | ICD-10-CM | POA: Diagnosis present

## 2015-01-11 DIAGNOSIS — L89502 Pressure ulcer of unspecified ankle, stage 2: Secondary | ICD-10-CM | POA: Diagnosis present

## 2015-01-11 DIAGNOSIS — Z79899 Other long term (current) drug therapy: Secondary | ICD-10-CM | POA: Diagnosis not present

## 2015-01-11 DIAGNOSIS — L89152 Pressure ulcer of sacral region, stage 2: Secondary | ICD-10-CM | POA: Diagnosis present

## 2015-01-11 DIAGNOSIS — M21371 Foot drop, right foot: Secondary | ICD-10-CM | POA: Diagnosis present

## 2015-01-11 DIAGNOSIS — R471 Dysarthria and anarthria: Secondary | ICD-10-CM | POA: Diagnosis present

## 2015-01-11 DIAGNOSIS — Z87891 Personal history of nicotine dependence: Secondary | ICD-10-CM | POA: Diagnosis not present

## 2015-01-11 DIAGNOSIS — I428 Other cardiomyopathies: Secondary | ICD-10-CM | POA: Diagnosis not present

## 2015-01-11 DIAGNOSIS — K5900 Constipation, unspecified: Secondary | ICD-10-CM | POA: Diagnosis present

## 2015-01-11 LAB — COMPREHENSIVE METABOLIC PANEL
ALBUMIN: 3.1 g/dL — AB (ref 3.5–5.0)
ALT: 18 U/L (ref 14–54)
AST: 18 U/L (ref 15–41)
Alkaline Phosphatase: 121 U/L (ref 38–126)
Anion gap: 8 (ref 5–15)
BUN: 17 mg/dL (ref 6–20)
CO2: 31 mmol/L (ref 22–32)
CREATININE: 0.79 mg/dL (ref 0.44–1.00)
Calcium: 9.3 mg/dL (ref 8.9–10.3)
Chloride: 106 mmol/L (ref 101–111)
GFR calc Af Amer: >60 mL/min (ref >60)
GFR calc non Af Amer: >60 mL/min (ref >60)
Glucose, Bld: 112 mg/dL — ABNORMAL HIGH (ref 65–99)
Potassium: 3.9 mmol/L (ref 3.5–5.1)
Sodium: 145 mmol/L (ref 135–145)
Total Bilirubin: 0.3 mg/dL (ref 0.3–1.2)
Total Protein: 6.4 g/dL — ABNORMAL LOW (ref 6.5–8.1)

## 2015-01-11 LAB — APTT: APTT: 35 s (ref 24–37)

## 2015-01-11 LAB — TSH: TSH: 1.209 u[IU]/mL (ref 0.350–4.500)

## 2015-01-11 LAB — CBC
HCT: 31.2 % — ABNORMAL LOW (ref 36.0–46.0)
Hemoglobin: 10.2 g/dL — ABNORMAL LOW (ref 12.0–15.0)
MCH: 27.4 pg (ref 26.0–34.0)
MCHC: 32.7 g/dL (ref 30.0–36.0)
MCV: 83.9 fL (ref 78.0–100.0)
Platelets: 256 10*3/uL (ref 150–400)
RBC: 3.72 MIL/uL — ABNORMAL LOW (ref 3.87–5.11)
RDW: 15.8 % — ABNORMAL HIGH (ref 11.5–15.5)
WBC: 6.3 10*3/uL (ref 4.0–10.5)

## 2015-01-11 LAB — PROCALCITONIN: Procalcitonin: <0.1 ng/mL

## 2015-01-11 LAB — MRSA PCR SCREENING: MRSA by PCR: NEGATIVE

## 2015-01-11 LAB — PROTIME-INR
INR: 1.21 (ref 0.00–1.49)
Prothrombin Time: 15.4 seconds — ABNORMAL HIGH (ref 11.6–15.2)

## 2015-01-11 LAB — LACTIC ACID, PLASMA: LACTIC ACID, VENOUS: 1.7 mmol/L (ref 0.5–2.0)

## 2015-01-11 LAB — GLUCOSE, CAPILLARY: Glucose-Capillary: 102 mg/dL — ABNORMAL HIGH (ref 65–99)

## 2015-01-11 MED ORDER — DARIFENACIN HYDROBROMIDE ER 7.5 MG PO TB24
7.5000 mg | ORAL_TABLET | Freq: Every day | ORAL | Status: DC
  Administered 2015-01-11 – 2015-01-17 (×6): 7.5 mg via ORAL
  Filled 2015-01-11 (×8): qty 1

## 2015-01-11 MED ORDER — PHENAZOPYRIDINE HCL 100 MG PO TABS
100.0000 mg | ORAL_TABLET | Freq: Three times a day (TID) | ORAL | Status: AC
  Administered 2015-01-11 – 2015-01-13 (×8): 100 mg via ORAL
  Filled 2015-01-11 (×9): qty 1

## 2015-01-11 MED ORDER — DULOXETINE HCL 60 MG PO CPEP
60.0000 mg | ORAL_CAPSULE | Freq: Every day | ORAL | Status: DC
  Administered 2015-01-11 – 2015-01-17 (×6): 60 mg via ORAL
  Filled 2015-01-11 (×8): qty 1

## 2015-01-11 MED ORDER — GABAPENTIN 300 MG PO CAPS
900.0000 mg | ORAL_CAPSULE | Freq: Three times a day (TID) | ORAL | Status: DC
  Administered 2015-01-11 – 2015-01-17 (×18): 900 mg via ORAL
  Filled 2015-01-11 (×10): qty 3
  Filled 2015-01-11: qty 9
  Filled 2015-01-11 (×9): qty 3

## 2015-01-11 MED ORDER — PRO-STAT SUGAR FREE PO LIQD
30.0000 mL | Freq: Two times a day (BID) | ORAL | Status: DC
  Administered 2015-01-11 – 2015-01-17 (×12): 30 mL via ORAL
  Filled 2015-01-11 (×12): qty 30

## 2015-01-11 MED ORDER — MORPHINE SULFATE 2 MG/ML IJ SOLN
2.0000 mg | INTRAMUSCULAR | Status: DC | PRN
  Administered 2015-01-11 – 2015-01-13 (×7): 2 mg via INTRAVENOUS
  Filled 2015-01-11 (×7): qty 1

## 2015-01-11 MED ORDER — POLYETHYLENE GLYCOL 3350 17 G PO PACK
17.0000 g | PACK | Freq: Two times a day (BID) | ORAL | Status: DC
  Administered 2015-01-11 – 2015-01-17 (×6): 17 g via ORAL
  Filled 2015-01-11 (×10): qty 1

## 2015-01-11 MED ORDER — DOCUSATE SODIUM 100 MG PO CAPS
100.0000 mg | ORAL_CAPSULE | Freq: Two times a day (BID) | ORAL | Status: DC
  Administered 2015-01-11 – 2015-01-17 (×8): 100 mg via ORAL
  Filled 2015-01-11 (×11): qty 1

## 2015-01-11 MED ORDER — AMANTADINE HCL 100 MG PO CAPS
100.0000 mg | ORAL_CAPSULE | Freq: Every day | ORAL | Status: DC
  Administered 2015-01-11 – 2015-01-17 (×6): 100 mg via ORAL
  Filled 2015-01-11 (×8): qty 1

## 2015-01-11 MED ORDER — SODIUM CHLORIDE 0.9 % IV BOLUS (SEPSIS): 500.0000 mL | INTRAVENOUS | Status: DC

## 2015-01-11 MED ORDER — ADULT MULTIVITAMIN W/MINERALS CH
1.0000 | ORAL_TABLET | Freq: Every day | ORAL | Status: DC
  Administered 2015-01-11 – 2015-01-17 (×6): 1 via ORAL
  Filled 2015-01-11 (×7): qty 1

## 2015-01-11 MED ORDER — LATANOPROST 0.005 % OP SOLN
1.0000 [drp] | Freq: Every day | OPHTHALMIC | Status: DC
  Administered 2015-01-11 – 2015-01-16 (×6): 1 [drp] via OPHTHALMIC
  Filled 2015-01-11: qty 2.5

## 2015-01-11 MED ORDER — BACLOFEN 20 MG PO TABS
20.0000 mg | ORAL_TABLET | Freq: Three times a day (TID) | ORAL | Status: DC
  Administered 2015-01-11 – 2015-01-17 (×18): 20 mg via ORAL
  Filled 2015-01-11 (×19): qty 1

## 2015-01-11 MED ORDER — VITAMIN D3 25 MCG (1000 UNIT) PO TABS
1000.0000 [IU] | ORAL_TABLET | Freq: Every day | ORAL | Status: DC
  Administered 2015-01-11 – 2015-01-17 (×6): 1000 [IU] via ORAL
  Filled 2015-01-11 (×10): qty 1

## 2015-01-11 MED ORDER — ONDANSETRON HCL 4 MG/2ML IJ SOLN: 4.0000 mg | Freq: Four times a day (QID) | INTRAMUSCULAR | Status: DC | PRN

## 2015-01-11 MED ORDER — SODIUM CHLORIDE 0.9 % IV BOLUS (SEPSIS): 1000.0000 mL | INTRAVENOUS | Status: DC

## 2015-01-11 MED ORDER — ACETAMINOPHEN 650 MG RE SUPP
650.0000 mg | Freq: Four times a day (QID) | RECTAL | Status: DC | PRN
Start: 2015-01-11 — End: 2015-01-17

## 2015-01-11 MED ORDER — SODIUM CHLORIDE 0.9 % IV SOLN
INTRAVENOUS | Status: DC
  Administered 2015-01-13 – 2015-01-14 (×2): via INTRAVENOUS

## 2015-01-11 MED ORDER — MAGNESIUM HYDROXIDE 400 MG/5ML PO SUSP: 30.0000 mL | Freq: Three times a day (TID) | ORAL | Status: DC | PRN

## 2015-01-11 MED ORDER — ONDANSETRON HCL 4 MG PO TABS: 4.0000 mg | ORAL_TABLET | Freq: Four times a day (QID) | ORAL | Status: DC | PRN

## 2015-01-11 MED ORDER — DOCUSATE SODIUM 100 MG PO CAPS: 100.0000 mg | ORAL_CAPSULE | Freq: Two times a day (BID) | ORAL | Status: DC

## 2015-01-11 MED ORDER — PIPERACILLIN-TAZOBACTAM 3.375 G IVPB
3.3750 g | Freq: Three times a day (TID) | INTRAVENOUS | Status: DC
  Administered 2015-01-11 – 2015-01-14 (×11): 3.375 g via INTRAVENOUS
  Filled 2015-01-11 (×14): qty 50

## 2015-01-11 MED ORDER — IMIPRAMINE HCL 50 MG PO TABS
75.0000 mg | ORAL_TABLET | Freq: Every evening | ORAL | Status: DC
  Administered 2015-01-11 – 2015-01-16 (×6): 75 mg via ORAL
  Filled 2015-01-11 (×7): qty 1

## 2015-01-11 MED ORDER — ACETAMINOPHEN 325 MG PO TABS
650.0000 mg | ORAL_TABLET | Freq: Four times a day (QID) | ORAL | Status: DC | PRN
  Administered 2015-01-11 – 2015-01-17 (×6): 650 mg via ORAL
  Filled 2015-01-11 (×6): qty 2

## 2015-01-11 MED ORDER — HEPARIN SODIUM (PORCINE) 5000 UNIT/ML IJ SOLN
5000.0000 [IU] | Freq: Three times a day (TID) | INTRAMUSCULAR | Status: DC
  Administered 2015-01-11 – 2015-01-17 (×19): 5000 [IU] via SUBCUTANEOUS
  Filled 2015-01-11 (×22): qty 1

## 2015-01-11 MED ORDER — SODIUM CHLORIDE 0.9 % IJ SOLN
3.0000 mL | Freq: Two times a day (BID) | INTRAMUSCULAR | Status: DC
  Administered 2015-01-11 – 2015-01-16 (×8): 3 mL via INTRAVENOUS

## 2015-01-11 NOTE — Progress Notes (Signed)
ANTIBIOTIC CONSULT NOTE - INITIAL  Pharmacy Consult for Zosyn  Indication: rule out sepsis, ?urinary source  Allergies  Allergen Reactions  . Sulfonamide Derivatives Hives and Shortness Of Breath  . Penicillins Hives and Itching    Vital Signs: Temp: 98.5 F (36.9 C) (07/11 2244) Temp Source: Rectal (07/11 2244) BP: 111/53 mmHg (07/12 0200) Pulse Rate: 80 (07/12 0200)  Labs:  Recent Labs  01/10/15 2244  WBC 6.2  HGB 11.3*  PLT 291  CREATININE 0.89   Estimated Creatinine Clearance: 78 mL/min (by C-G formula based on Cr of 0.89).   Microbiology: Recent Results (from the past 720 hour(s))  Culture, blood (routine x 2)     Status: None (Preliminary result)   Collection Time: 01/11/15 12:39 AM  Result Value Ref Range Status   Specimen Description BLOOD LEFT ARM  Final   Special Requests BOTTLES DRAWN AEROBIC AND ANAEROBIC 5CC EA  Final   Culture PENDING  Incomplete   Report Status PENDING  Incomplete    Medical History: Past Medical History  Diagnosis Date  . MS (multiple sclerosis)     a. Dx'd late 20's. b. Tx with Novantrone, Tysabri, Copaxone previously.  Marland Kitchen Hypertension   . Depression   . Glaucoma   . Cardiomyopathy     a.  Echo 04/29/12: Mild LVH, EF 20-25%, mild AI, moderate MR, moderate LAE, mild RAE, mild RVE, moderate TR, PASP 51, small pericardial effusion;   b. probably non-ischemic given multiple chemo-Tx agents used for MS and global LV dysfn on echo  . Chronic systolic heart failure   . CHF (congestive heart failure)   . Shortness of breath   . Neuromuscular disorder     MS   Assessment: 54 y/o F with MS presents from [facility] with altered mental status, WBC WNL, renal function ok, lactic acid WNL, U/A with +nitrites/small leukocytes, imaging unremarkable, other labs as above.   Plan:  Zosyn 3.375G IV q8h to be infused over 4 hours F/U cultures  Narda Bonds 01/11/2015,2:56 AM

## 2015-01-11 NOTE — Evaluation (Signed)
Clinical/Bedside Swallow Evaluation Patient Details  Name: Krystan Northrop MRN: [id number] Date of Birth: [birth year]  Today's Date: 01/11/2015 Time: SLP Start Time (ACUTE ONLY): 0930 SLP Stop Time (ACUTE ONLY): 0940 SLP Time Calculation (min) (ACUTE ONLY): 10 min  Past Medical History:  Past Medical History  Diagnosis Date  . MS (multiple sclerosis)     a. Dx'd late 20's. b. Tx with Novantrone, Tysabri, Copaxone previously.  Marland Kitchen Hypertension   . Depression   . Glaucoma   . Cardiomyopathy     a.  Echo 04/29/12: Mild LVH, EF 20-25%, mild AI, moderate MR, moderate LAE, mild RAE, mild RVE, moderate TR, PASP 51, small pericardial effusion;   b. probably non-ischemic given multiple chemo-Tx agents used for MS and global LV dysfn on echo  . Chronic systolic heart failure   . CHF (congestive heart failure)   . Shortness of breath   . Neuromuscular disorder     MS   Past Surgical History:  Past Surgical History  Procedure Laterality Date  . Ablation      uterine  . Cesarean section     HPI:  Josiah Wojtaszek is a 54 y.o. female with history of MS residing at [facility] presents with altered mental status. By history patient is normally awake and is able to carry on conversations. She has been noted to have decreased mentation according to the staff at the facility. She had received percocet and also had received baclofen. On arrival to the ED she was noted to be hypotensive. She had good response to fluid bolus in the ED. Patient is also on diuretics and her BUN was slightly elevated. The main concern in the ED was that she may have early SIRS or a UTI and so is being admitted for further antibiotics therapy and monitoring. Her urine shows Nitrite positive with small amount of leukocytes.   Assessment / Plan / Recommendation Clinical Impression  Pt demonstrates normal swallow function though awareness of PO is impacted by AMS. No signs of aspiration seen, but pt needs tactile cues for feeding.  Mastication also slow and prolonged. Recommend pt initiate a dys 3 (mechanical soft) diet with thin liquids. SLP will f/u for upgrade as mentation improves.     Aspiration Risk  Mild    Diet Recommendation Dysphagia 3 (Mech soft);Thin   Medication Administration: Whole meds with liquid    Other  Recommendations     Follow Up Recommendations       Frequency and Duration min 1 x/week  1 week   Pertinent Vitals/Pain NA    SLP Swallow Goals     Swallow Study Prior Functional Status       General Other Pertinent Information: Lydiann Bonifas is a 54 y.o. female with history of MS residing at [facility] presents with altered mental status. By history patient is normally awake and is able to carry on conversations. She has been noted to have decreased mentation according to the staff at the facility. She had received percocet and also had received baclofen. On arrival to the ED she was noted to be hypotensive. She had good response to fluid bolus in the ED. Patient is also on diuretics and her BUN was slightly elevated. The main concern in the ED was that she may have early SIRS or a UTI and so is being admitted for further antibiotics therapy and monitoring. Her urine shows Nitrite positive with small amount of leukocytes. Type of Study: Bedside swallow evaluation Previous Swallow Assessment: none  Diet Prior to this Study: NPO Temperature Spikes Noted: No Respiratory Status: Room air History of Recent Intubation: No Behavior/Cognition: Alert;Confused;Requires cueing Oral Cavity - Dentition: Adequate natural dentition/normal for age Self-Feeding Abilities: Needs assist Patient Positioning: Upright in bed Baseline Vocal Quality: Normal Volitional Cough: Cognitively unable to elicit Volitional Swallow: Unable to elicit    Oral/Motor/Sensory Function Overall Oral Motor/Sensory Function:  (appears WNL, doesnt follow oral motor commands)   Ice Chips     Thin Liquid Thin Liquid: Within  functional limits Presentation: Cup;Straw    Nectar Thick Nectar Thick Liquid: Not tested   Honey Thick Honey Thick Liquid: Not tested   Puree Puree: Within functional limits   Solid   GO    Solid: Impaired Oral Phase Impairments: Poor awareness of bolus      Herbie Baltimore, MA CCC-SLP [phone]  Ambar Raphael, Katherene Ponto 01/11/2015,10:27 AM

## 2015-01-11 NOTE — Progress Notes (Signed)
UR COMPLETED  

## 2015-01-11 NOTE — Progress Notes (Signed)
[place] TEAM 1 - Stepdown/ICU TEAM Progress Note  Shannon Garrett [id number] DOB: 1960-12-11 DOA: 01/10/2015 PCP: Gildardo Cranker, DO  Admit HPI / Brief Narrative: Shannon Garrett is a 54 y.o. BF PMHx Depression, Multiple Sclerosis, Cardiomyopathy (04/29/12 EF 20-25%), HTN, residing at [facility] with altered mental status. By history patient is normally awake and is able to carry on conversations. She has been noted to have decreased mentation according to the staff at the facility. She had received percocet and also had received baclofen. On arrival to the ED she was noted to be hypotensive. She had good response to fluid bolus in the ED. Patient is also on diuretics and her BUN was slightly elevated. The main concern in the ED was that she may have early SIRS or a UTI and so is being admitted for further antibiotics therapy and monitoring. Her urine shows Nitrite positive with small amount of leukocytes.  HPI/Subjective: 7/12 alert, positive dysarthria, states having pain abdomen.  Assessment/Plan: Urinary tract infection uncomplicated -cultures have been ordered including Blood and Urine -Continue empiric antibiotics antibiotics for possible UTI -Normal saline at 50 ml/hr -Monitor closely for fluid overload  Cardiomyopathy/Chronic Diastolic Heart Failure -Echocardiogram pending -Strict in and out; since admission + 1 L -Daily a.m. weights on standing scale; admission weight= 88.4 kg,      7/12 bed weight= 98.1 kg -Monitor closely for fluid overload  Altered Mental Status -Improved, however patient still has dysarthria (baseline?)   Hypovolemia -See UTI -will hold lasix and spironolactone  HTN -she was hypotensive on presentation -will hold diuretics and coreg for now and resume once she is hemodynamically stable  Multiple Sclerosis -Patient's admission MAR does not show patient to currently be on medication  -However monitor for decompensation secondary to  acute illness  Abnormal LFT -Resolved   Code Status: FULL Family Communication: no family present at time of exam Disposition Plan:     Consultants: NA   Procedure/Significant Events: 7/12 CT head without contrast; No acute intracranial abnormalities. Mild atrophy. White matter disease compatible with history of MS   Culture NA  Antibiotics: Ceftriaxone 7/11>>  DVT prophylaxis: Heparin subcutaneous   Devices NA   LINES / TUBES:      Continuous Infusions: . sodium chloride 50 mL/hr at 01/11/15 1545    Objective: VITAL SIGNS: Temp: 98.3 F (36.8 C) (07/12 1528) Temp Source: Oral (07/12 1528) BP: 125/61 mmHg (07/12 0725) Pulse Rate: 81 (07/12 0600) SPO2; FIO2:   Intake/Output Summary (Last 24 hours) at 01/11/15 1555 Last data filed at 01/11/15 0600  Gross per 24 hour  Intake 1037.5 ml  Output      0 ml  Net 1037.5 ml     Exam: General:  alert, positive dysarthria, states having pain abdomen., No acute respiratory distress Eyes: Negative headache, eye pain, double vision, negative scleral hemorrhage ENT: Negative Runny nose, negative ear pain, negative tinnitus, negative gingival bleeding Neck:  Negative scars, masses, torticollis, lymphadenopathy, JVD Lungs: Clear to auscultation bilaterally without wheezes or crackles Cardiovascular: Regular rate and rhythm without murmur gallop or rub normal S1 and S2 Abdomen: Morbidly obese, positive suprapubic abdominal pain, bilateral CVA tenderness, negative dysphagia,  soft, bowel sounds positive, no rebound, no ascites, no appreciable mass Extremities: No significant cyanosis, clubbing, or edema bilateral lower extremities Psychiatric:  Negative depression, negative anxiety, negative fatigue, negative mania  Neurologic:  Cranial nerves II through XII intact, tongue/uvula midline, all extremities muscle strength 5/5, sensation intact throughout, positive dysarthria, positive  expressive aphasia, negative  receptive aphasia.      Data Reviewed: Basic Metabolic Panel:  Recent Labs Lab 01/10/15 2244 01/11/15 0333  NA 140 145  K 4.5 3.9  CL 102 106  CO2 29 31  GLUCOSE 101* 112*  BUN 21* 17  CREATININE 0.89 0.79  CALCIUM 9.2 9.3   Liver Function Tests:  Recent Labs Lab 01/10/15 2244 01/11/15 0333  AST 36 18  ALT 19 18  ALKPHOS 128* 121  BILITOT 1.3* 0.3  PROT 6.6 6.4*  ALBUMIN 3.4* 3.1*   No results for input(s): LIPASE, AMYLASE in the last 168 hours. No results for input(s): AMMONIA in the last 168 hours. CBC:  Recent Labs Lab 01/10/15 2244 01/11/15 0333  WBC 6.2 6.3  NEUTROABS 4.4  --   HGB 11.3* 10.2*  HCT 34.0* 31.2*  MCV 84.2 83.9  PLT 291 256   Cardiac Enzymes: No results for input(s): CKTOTAL, CKMB, CKMBINDEX, TROPONINI in the last 168 hours. BNP (last 3 results) No results for input(s): BNP in the last 8760 hours.  ProBNP (last 3 results)  Recent Labs  02/09/14 1630 02/19/14 2312 05/23/14 1935  PROBNP 34.0 275.9* 55.3    CBG:  Recent Labs Lab 01/11/15 0357  GLUCAP 102*    Recent Results (from the past 240 hour(s))  Culture, blood (routine x 2)     Status: None (Preliminary result)   Collection Time: 01/11/15 12:39 AM  Result Value Ref Range Status   Specimen Description BLOOD LEFT ARM  Final   Special Requests BOTTLES DRAWN AEROBIC AND ANAEROBIC 5CC EA  Final   Culture PENDING  Incomplete   Report Status PENDING  Incomplete  MRSA PCR Screening     Status: None   Collection Time: 01/11/15  2:45 AM  Result Value Ref Range Status   MRSA by PCR NEGATIVE NEGATIVE Final    Comment:        The GeneXpert MRSA Assay (FDA approved for NASAL specimens only), is one component of a comprehensive MRSA colonization surveillance program. It is not intended to diagnose MRSA infection nor to guide or monitor treatment for MRSA infections.      Studies:  Recent x-ray studies have been reviewed in detail by the Attending  Physician  Scheduled Meds:  Scheduled Meds: . amantadine  100 mg Oral Daily  . baclofen  20 mg Oral TID  . cholecalciferol  1,000 Units Oral Daily  . darifenacin  7.5 mg Oral Daily  . docusate sodium  100 mg Oral BID  . DULoxetine  60 mg Oral Daily  . feeding supplement (PRO-STAT SUGAR FREE 64)  30 mL Oral BID  . gabapentin  900 mg Oral TID  . heparin  5,000 Units Subcutaneous 3 times per day  . imipramine  75 mg Oral QPM  . latanoprost  1 drop Both Eyes QHS  . multivitamin with minerals  1 tablet Oral Daily  . phenazopyridine  100 mg Oral TID  . piperacillin-tazobactam  3.375 g Intravenous 3 times per day  . polyethylene glycol  17 g Oral BID  . sodium chloride  3 mL Intravenous Q12H    Time spent on care of this patient: 40 mins   WOODS, Geraldo Docker , MD  [facility] Office  [phone] Pager - [phone]  On-Call/Text Page:      [email or web address]      password [email or web address]  If 7PM-7AM, please contact night-coverage [email or web address] Password [email or web address] 01/11/2015, 3:55 PM   LOS: 0 days  Care during the described time interval was provided by me .  I have reviewed this patient's available data, including medical history, events of note, physical examination, and all test results as part of my evaluation. I have personally reviewed and interpreted all radiology studies.   Dia Crawford, MD [phone] Pager

## 2015-01-11 NOTE — H&P (Signed)
[facility] History and Physical  Nakkia Mackiewicz [id number]:[id number] DOB: [birth year] DOA: 01/10/2015  Referring physician: Carlisle Cater, PA PCP: Gildardo Cranker, DO   Chief Complaint: Altered Mental Status  HPI: Shannon Garrett is a 54 y.o. female with history of MS residing at [facility] presents with altered mental status. By history patient is normally awake and is able to carry on conversations. She has been noted to have decreased mentation according to the staff at the facility. She had received percocet and also had received baclofen. On arrival to the ED she was noted to be hypotensive. She had good response to fluid bolus in the ED. Patient is also on diuretics and her BUN was slightly elevated. The main concern in the ED was that she may have early SIRS or a UTI and so is being admitted for further antibiotics therapy and monitoring. Her urine shows Nitrite positive with small amount of leukocytes.   Review of Systems:  Patient is not able to provide a ROS  Past Medical History  Diagnosis Date  . MS (multiple sclerosis)     a. Dx'd late 20's. b. Tx with Novantrone, Tysabri, Copaxone previously.  Marland Kitchen Hypertension   . Depression   . Glaucoma   . Cardiomyopathy     a.  Echo 04/29/12: Mild LVH, EF 20-25%, mild AI, moderate MR, moderate LAE, mild RAE, mild RVE, moderate TR, PASP 51, small pericardial effusion;   b. probably non-ischemic given multiple chemo-Tx agents used for MS and global LV dysfn on echo  . Chronic systolic heart failure   . CHF (congestive heart failure)   . Shortness of breath   . Neuromuscular disorder     MS   Past Surgical History  Procedure Laterality Date  . Ablation      uterine  . Cesarean section     Social History:  reports that she has quit smoking. She has never used smokeless tobacco. She reports that she does not drink alcohol or use illicit drugs.  Allergies  Allergen Reactions  . Sulfonamide Derivatives Hives and Shortness Of Breath  .  Penicillins Hives and Itching    Family History  Problem Relation Age of Onset  . Hypertension Mother   . Heart attack Neg Hx   . Cancer Mother     breast   . Cancer Father     prostate  . Multiple sclerosis Sister      Prior to Admission medications   Medication Sig Start Date End Date Taking? Authorizing Provider  amantadine (SYMMETREL) 100 MG capsule Take 100 mg by mouth daily.    Yes Historical Provider, MD  Amino Acids-Protein Hydrolys (FEEDING SUPPLEMENT, PRO-STAT SUGAR FREE 64,) LIQD Take 30 mLs by mouth 2 (two) times daily.   Yes Historical Provider, MD  baclofen (LIORESAL) 10 MG tablet Take 1 tablet (10 mg total) by mouth 3 (three) times daily. Patient taking differently: Take 20 mg by mouth 3 (three) times daily.  09/27/14  Yes Adam Telford Nab, DO  carvedilol (COREG) 25 MG tablet Take 1 tablet (25 mg total) by mouth 2 (two) times daily with a meal. 02/22/14  Yes Verlee Monte, MD  Cholecalciferol (VITAMIN D-3 PO) Take 1 tablet by mouth daily.   Yes Historical Provider, MD  Cyanocobalamin (VITAMIN B-12 PO) Take 1 tablet by mouth daily.   Yes Historical Provider, MD  docusate sodium (COLACE) 100 MG capsule Take 100 mg by mouth 2 (two) times daily.   Yes Historical Provider, MD  DULoxetine (CYMBALTA)  60 MG capsule Take 60 mg by mouth daily.   Yes Historical Provider, MD  furosemide (LASIX) 80 MG tablet Take 1 tablet (80 mg total) by mouth 2 (two) times daily. Take 160 mg every morning and 80 mg po q HS Patient taking differently: Take 80 mg by mouth daily.  02/22/14  Yes Verlee Monte, MD  gabapentin (NEURONTIN) 300 MG capsule Take 3 capsules (900 mg total) by mouth 3 (three) times daily. 09/27/14  Yes Adam Telford Nab, DO  imipramine (TOFRANIL) 50 MG tablet Take 1.5 tablets (75 mg total) by mouth every evening. For depressive disorder 01/05/15  Yes Gerlene Fee, NP  latanoprost (XALATAN) 0.005 % ophthalmic solution Place 1 drop into both eyes at bedtime.   Yes Historical Provider, MD    magnesium hydroxide (MILK OF MAGNESIA) 400 MG/5ML suspension Take 30 mLs by mouth every 8 (eight) hours as needed for mild constipation or moderate constipation.   Yes Historical Provider, MD  Multiple Vitamin (MULTIVITAMIN WITH MINERALS) TABS tablet Take 1 tablet by mouth daily.   Yes Historical Provider, MD  oxyCODONE-acetaminophen (PERCOCET) 10-325 MG per tablet Take one tablet by mouth every 6 hours as needed for pain. Not to exceed 3gm APAP from all sources/24hours 01/10/15  Yes Tiffany L Reed, DO  oxyCODONE-acetaminophen (PERCOCET) 7.5-325 MG per tablet Take one tablet by mouth three times daily for pain. Not to exceed 3gm from all sources/24h 12/27/14  Yes Tiffany L Reed, DO  phenazopyridine (PYRIDIUM) 100 MG tablet Take 100 mg by mouth 3 (three) times daily.    Yes Historical Provider, MD  polyethylene glycol (MIRALAX / GLYCOLAX) packet Take 17 g by mouth 2 (two) times daily. 01/05/15  Yes Gerlene Fee, NP  potassium chloride (KLOR-CON) 20 MEQ packet Take 40 mEq by mouth daily.   Yes Historical Provider, MD  solifenacin (VESICARE) 10 MG tablet Take 10 mg by mouth daily. For bladder spasms   Yes Historical Provider, MD  spironolactone (ALDACTONE) 25 MG tablet Take 25 mg by mouth daily. For CHF   Yes Historical Provider, MD  zolpidem (AMBIEN) 10 MG tablet Take one tablet by mouth at bedtime for rest 01/10/15  Yes Gayland Curry, DO   Physical Exam: Filed Vitals:   01/10/15 2300 01/10/15 2348 01/10/15 2349 01/11/15 0015  BP: 113/49 127/68  123/53  Pulse: 85  81 85  Temp:      TempSrc:      Resp: 26  31 16   SpO2: 99%  100% 97%    Wt Readings from Last 3 Encounters:  01/07/15 88.451 kg (195 lb)  12/15/14 97.977 kg (216 lb)  12/09/14 97.977 kg (216 lb)    General:  Appears comfortable Eyes: PERRL, normal lids ENT: grossly mucus membranes are dry Neck: no LAD, masses Cardiovascular: RRR, no m/r/g. No LE edema. Respiratory: CTA bilaterally, no w/r/r Abdomen: soft, ntnd Skin: no  rash or induration seen Musculoskeletal: grossly normal tone BUE/BLE Psychiatric: unable to assess Neurologic: unable to assess.          Labs on Admission:  Basic Metabolic Panel:  Recent Labs Lab 01/10/15 2244  NA 140  K 4.5  CL 102  CO2 29  GLUCOSE 101*  BUN 21*  CREATININE 0.89  CALCIUM 9.2   Liver Function Tests:  Recent Labs Lab 01/10/15 2244  AST 36  ALT 19  ALKPHOS 128*  BILITOT 1.3*  PROT 6.6  ALBUMIN 3.4*   No results for input(s): LIPASE, AMYLASE in the last 168  hours. No results for input(s): AMMONIA in the last 168 hours. CBC:  Recent Labs Lab 01/10/15 2244  WBC 6.2  NEUTROABS 4.4  HGB 11.3*  HCT 34.0*  MCV 84.2  PLT 291   Cardiac Enzymes: No results for input(s): CKTOTAL, CKMB, CKMBINDEX, TROPONINI in the last 168 hours.  BNP (last 3 results) No results for input(s): BNP in the last 8760 hours.  ProBNP (last 3 results)  Recent Labs  02/09/14 1630 02/19/14 2312 05/23/14 1935  PROBNP 34.0 275.9* 55.3    CBG: No results for input(s): GLUCAP in the last 168 hours.  Radiological Exams on Admission: Ct Head Wo Contrast  01/11/2015   CLINICAL DATA:  Altered mental status.  EXAM: CT HEAD WITHOUT CONTRAST  TECHNIQUE: Contiguous axial images were obtained from the base of the skull through the vertex without intravenous contrast.  COMPARISON:  11/03/2013  FINDINGS: Technically limited study due to motion artifact. Diffuse cerebral atrophy. Mild ventricular dilatation. Patchy low-attenuation changes in the deep white matter consistent with known history of multiple sclerosis. No mass effect or midline shift. No abnormal extra-axial fluid collections. Gray-white matter junctions are distinct. Basal cisterns are not effaced. No evidence of acute intracranial hemorrhage. No depressed skull fractures. Visualized paranasal sinuses and mastoid air cells are not opacified.  IMPRESSION: No acute intracranial abnormalities. Mild atrophy. White matter  disease compatible with history of MS.   Electronically Signed   By: Lucienne Capers M.D.   On: 01/11/2015 00:06   Dg Chest Portable 1 View  01/10/2015   CLINICAL DATA:  54 year old female with shortness of breath  EXAM: PORTABLE CHEST - 1 VIEW  COMPARISON:  Chest radiograph dated 05/23/2014  FINDINGS: The heart size and mediastinal contours are within normal limits. Both lungs are clear. The visualized skeletal structures are unremarkable.  IMPRESSION: No active disease.   Electronically Signed   By: Anner Crete M.D.   On: 01/10/2015 22:33      Assessment/Plan Principal Problem:   SIRS (systemic inflammatory response syndrome) Active Problems:   HYPERTENSION, BENIGN ESSENTIAL   Chronic diastolic heart failure   DS (disseminated sclerosis)   Altered mental status   UTI (lower urinary tract infection)   Hypovolemia   1. Possible SIRS -cultures have been ordered including Blood and Urine -will start on empiric antibiotics for possible UTI -will start on aggressive fluid resuscitation  2. Altered Mental Status -likely related to acute infectious process and dehydration  3. Possible UTI -will start on empiric antibiotics  4. Hypovolemia -Will start on IVF -monitor pressures -will hold lasix and spironolactone  5. HTN -she was hypotensive on presentation -will hold diuretics and coreg for now and resume once she is hemodynamically stable  6. Chronic Diastolic Heart Failure -currently she is stable -will monitor  7. Multiple Sclerosis -continue supportive care  8.Abnormal LFT -will repeat Labs -consider abdominal US   Code Status: Full code (must indicate code status--if unknown or must be presumed, indicate so) DVT Prophylaxis:Heparin Family Communication: none (indicate person spoken with, if applicable, with phone number if by telephone) Disposition Plan: home (indicate anticipated LOS)  Time spent: 60min  Mosiah Bastin A [facility] Pager  [phone]

## 2015-01-12 ENCOUNTER — Inpatient Hospital Stay (HOSPITAL_COMMUNITY): Payer: Medicare Other

## 2015-01-12 DIAGNOSIS — I428 Other cardiomyopathies: Secondary | ICD-10-CM

## 2015-01-12 DIAGNOSIS — L8990 Pressure ulcer of unspecified site, unspecified stage: Secondary | ICD-10-CM | POA: Diagnosis present

## 2015-01-12 LAB — RETICULOCYTES
RBC.: 3.7 MIL/uL — ABNORMAL LOW (ref 3.87–5.11)
Retic Count, Absolute: 70.3 10*3/uL (ref 19.0–186.0)
Retic Ct Pct: 1.9 % (ref 0.4–3.1)

## 2015-01-12 LAB — IRON AND TIBC
IRON: 50 ug/dL (ref 28–170)
Saturation Ratios: 20 % (ref 10.4–31.8)
TIBC: 253 ug/dL (ref 250–450)
UIBC: 203 ug/dL

## 2015-01-12 LAB — FOLATE: FOLATE: 24.2 ng/mL (ref >5.9)

## 2015-01-12 LAB — HEMOGLOBIN A1C
Hgb A1c MFr Bld: 5.3 % (ref 4.8–5.6)
MEAN PLASMA GLUCOSE: 105 mg/dL

## 2015-01-12 LAB — FERRITIN: Ferritin: 121 ng/mL (ref 11–307)

## 2015-01-12 LAB — VITAMIN B12: Vitamin B-12: 2487 pg/mL — ABNORMAL HIGH (ref 180–914)

## 2015-01-12 LAB — AMMONIA: Ammonia: 30 umol/L (ref 9–35)

## 2015-01-12 MED ORDER — SALINE SPRAY 0.65 % NA SOLN
1.0000 | NASAL | Status: DC | PRN
  Administered 2015-01-12 – 2015-01-14 (×2): 1 via NASAL
  Filled 2015-01-12: qty 44

## 2015-01-12 NOTE — Progress Notes (Signed)
Pt asked for pain medicine, upon providing pain medicine along with morning medicines, pt spit every pill out no matter how RN would provided, with water or applesauce. MAR is documented as pt refused medicines.

## 2015-01-12 NOTE — Care Management Note (Signed)
Case Management Note  Patient Details  Name: Joelynn Dust MRN: [id number] Date of Birth: August 18, 1960  Subjective/Objective:                  Pt from  [facility]  presents with altered mental status, with hx of MS.    Action/Plan: Return to SNF when medically stable. CM to f/u with d/c needs.  Expected Discharge Date:                  Expected Discharge Plan:  Skilled Nursing Facility  In-House Referral:  Clinical Social Work  Discharge planning Services  CM Consult  Post Acute Care Choice:    Choice offered to:     DME Arranged:    DME Agency:     HH Arranged:    [place] Agency:     Status of Service:  In process, will continue to follow  Medicare Important Message Given:    Date Medicare IM Given:    Medicare IM give by:    Date Additional Medicare IM Given:    Additional Medicare Important Message give by:     If discussed at [place] of Stay Meetings, dates discussed:    Additional Comments: Geetika Laborde (Sister)  [phone]  Sharin Mons, RN 01/12/2015, 11:14 AM

## 2015-01-12 NOTE — Progress Notes (Signed)
  Echocardiogram 2D Echocardiogram has been performed.  Shannon Garrett 01/12/2015, 11:37 AM

## 2015-01-12 NOTE — Progress Notes (Signed)
Speech Language Pathology Treatment: Dysphagia  Patient Details Name: Shannon Garrett MRN: [id number] DOB: April 23, 1961 Today's Date: 01/12/2015 Time: 3559-7416 SLP Time Calculation (min) (ACUTE ONLY): 17 min  Assessment / Plan / Recommendation Clinical Impression  Provided trials of PO given reported difficulty by RN. Pts attention more significantly impaired today, requires max cues for focused attention. Eye contact and saying her name with a command was most effective for feeding pudding and sips of water today. Discussed with RN who reports similar work, but pt very resistant to pills. Yesterday was better. Hopeful that mentation will improve. For now, downgrade to puree/thin and offer pills crushed in sweet foods, like ice cream or chocolate pudding.    HPI Other Pertinent Information: Shannon Garrett is a 54 y.o. female with history of MS residing at [facility] presents with altered mental status. By history patient is normally awake and is able to carry on conversations. She has been noted to have decreased mentation according to the staff at the facility. She had received percocet and also had received baclofen. On arrival to the ED she was noted to be hypotensive. She had good response to fluid bolus in the ED. Patient is also on diuretics and her BUN was slightly elevated. The main concern in the ED was that she may have early SIRS or a UTI and so is being admitted for further antibiotics therapy and monitoring. Her urine shows Nitrite positive with small amount of leukocytes.   Pertinent Vitals    SLP Plan  Continue with current plan of care    Recommendations Diet recommendations: Dysphagia 1 (puree);Thin liquid Liquids provided via: Cup;Straw Medication Administration: Crushed with puree (ice cream if needed. ok to try whole ) Supervision: Staff to assist with self feeding;Full supervision/cueing for compensatory strategies Compensations: Small sips/bites;Slow rate Postural Changes  and/or Swallow Maneuvers: Seated upright 90 degrees              Oral Care Recommendations: Oral care BID Follow up Recommendations: Skilled Nursing facility Plan: Continue with current plan of care    GO     Shannon Garrett, Katherene Ponto 01/12/2015, 10:52 AM

## 2015-01-12 NOTE — Clinical Social Work Note (Signed)
Clinical Social Work Assessment  Patient Details  Name: Shannon Garrett MRN: [id number] Date of Birth: [birth year]  Date of referral:  01/12/15               Reason for consult:  Facility Placement ([place] )              Housing/Transportation Living arrangements for the past 2 months:  [place] Information:   Shannon Garrett, sister ) Patient Interpreter Needed:  None Criminal Activity/Legal Involvement Pertinent to Current Situation/Hospitalization:  No - Comment as needed Significant Relationships:  Siblings Shannon Garrett) Lives with:  Facility Resident Do you feel safe going back to the place where you live?  Yes Need for family participation in patient care:  Yes (Comment)  Care giving concerns: N/A   Social Worker assessment / plan:  CSW spoke pt's sister Shannon Garrett. CSW introduced self and purpose of the call.  Shannon Garrett confirmed that the pt is a resident at [facility]. Shannon Garrett agreed that the pt can return back to SNF.  CSW answered all questions in which Shannon Garrett inquired about. CSW will continue to follow this pt and assist with discharge as needed.   Employment status:  Unemployed Nurse, adult PT Recommendations:  [place] / Referral to community resources:   (Return back to [facility])  Patient/Family's Response to care:  Shannon Garrett reported that the nursing staff has treated the pt well.   Patient/Family's Understanding of and Emotional Response to Diagnosis, Current Treatment, and Prognosis: Shannon Garrett reported that she will be meeting with the MD to discuss the pt's condition.   Emotional Assessment Appearance:   (Unable to Assess ) Attitude/Demeanor/Rapport:  Unable to Assess Affect (typically observed):  Unable to Assess Orientation:  Oriented to Self Alcohol / Substance use:  Not Applicable Psych involvement (Current and /or in the community):  No  (Comment)  Discharge Needs  Concerns to be addressed:  Denies Needs/Concerns at this time Readmission within the last 30 days:  No Current discharge risk:  None Barriers to Discharge:  No Barriers Identified   Shannon Mcclatchy, LCSW 01/12/2015, 11:31 AM

## 2015-01-12 NOTE — Progress Notes (Signed)
Initial Nutrition Assessment  DOCUMENTATION CODES:   Obesity unspecified  INTERVENTION:    Kozy Shack Pudding BID between meals.  Magic cup TID with meals, each supplement provides 290 kcal and 9 grams of protein.  NUTRITION DIAGNOSIS:   Inadequate oral intake related to lethargy/confusion as evidenced by meal completion < 25%.  GOAL:   Patient will meet greater than or equal to 90% of their needs  MONITOR:   PO intake, Supplement acceptance, Diet advancement, Weight trends, Labs  REASON FOR ASSESSMENT:   Low [place]    ASSESSMENT:   Patient with history of MS residing at [facility], admitted on 7/11 with altered mental status.   Per discussion with RN, patient spit out her food and medications this AM when RN was feeding her breakfast. S/P eval with SLP this AM; diet was downgraded to dysphagia 1 (pureed food) with thin liquids. Patient unable to provide RD with any nutrition hx. Nutrition focused physical exam completed.  No muscle or subcutaneous fat depletion noticed.  Diet Order:  DIET - DYS 1 Room service appropriate?: Yes; Fluid consistency:: Thin  Skin:  Wound (see comment) (stage II pressure ulcers to ankle and sacrum)  Last BM:  7/12  Height:   Ht Readings from Last 1 Encounters:  01/11/15 5\' 3"  (1.6 m)    Weight:   Wt Readings from Last 1 Encounters:  01/12/15 218 lb 0.6 oz (98.9 kg)    Ideal Body Weight:  52.3 kg  Wt Readings from Last 10 Encounters:  01/12/15 218 lb 0.6 oz (98.9 kg)  01/07/15 195 lb (88.451 kg)  12/15/14 216 lb (97.977 kg)  12/09/14 216 lb (97.977 kg)  10/21/14 210 lb (95.255 kg)  09/27/14 207 lb (93.895 kg)  09/16/14 205 lb (92.987 kg)  07/19/14 203 lb (92.08 kg)  05/23/14 200 lb (90.719 kg)  02/22/14 213 lb 6.5 oz (96.8 kg)    BMI:  Body mass index is 38.63 kg/(m^2).  Estimated Nutritional Needs:   Kcal:  1800-2000  Protein:  100-110 gm  Fluid:  2 L  EDUCATION NEEDS:   No education needs identified at  this time   Molli Barrows, [place], [place], [place] Pager [phone] After Hours Pager [phone]

## 2015-01-12 NOTE — Progress Notes (Signed)
[facility] TEAM 1 - Stepdown/ICU TEAM Progress Note  Shannon Garrett OEU:[id number] DOB: 1961-06-25 DOA: 01/10/2015 PCP: Gildardo Cranker, DO  Admit HPI / Brief Narrative: 54 y.o. F w/ a Hx of Depression, Multiple Sclerosis, Cardiomyopathy (04/29/12 EF 20-25%), and HTN, who resides at [facility] who presented with altered mental status. By history patient is normally able to carry on a conversation. She had received percocet and baclofen.   On arrival to the ED she was noted to be hypotensive. Her BUN was slightly elevated. Her urine noted Nitrite positive with small amount of leukocytes.  HPI/Subjective: The patient is alert but remains quite altered.  At times she simply repeats her name.  She will not follow simple commands.  She does intermittently move all 4 extremities.  She will not provide a full review of systems due to her confusion.  Assessment/Plan:  Altered Mental Status The patient is alert but remains quite altered - the exact etiology is not clear this time - a CT of the head was without acute findings but confirmed white matter disease consistent with MS - there is no compelling evidence of a significant infection - her neurologic exam does not suggest a focal CVA but nonetheless she cannot cooperate with an MRI at the present time - TSH is normal - I will check B 12, folic acid, ammonia, and RPR levels - if her mental status has not improved much more we will consult her neurologist  Possible Urinary tract infection uncomplicated -cultures have not yet been helpful  -Continue empiric antibiotics antibiotics for possible UTI  Dysarthria Unclear of baseline - SLP suggests D1 diet w/ thin liquids appropriate    Cardiomyopathy / Chronic Diastolic Heart Failure Appears well compensated at the present time - follow daily weights and I's and O's  Hypovolemia -hold lasix and spironolactone until oral intake improves  HTN -hypotensive on presentation - blood pressure now  stabilizing - follow trend without change in treatment today  Multiple Sclerosis -Patient's admission MAR does not show MS medication - is followed at [facility] - per office notes she is not ambulatory at baseline - she has frequent bladder spasms and urinary incontinence - plans are currently underway to resume Tysabri - her baclofen and percocet were increased on 01/07/2015  Abnormal LFT -Resolved  Code Status: FULL Family Communication: no family present at time of exam Disposition Plan: SDU  Consultants: NA  Procedures: 7/12 CT head without contrast No acute intracranial abnormalities. Mild atrophy. White matter disease compatible with history of MS  Antibiotics: Ceftriaxone 7/11 >  DVT prophylaxis: Heparin subcutaneous  Objective: Blood pressure 130/62, pulse 74, temperature 98.7 F (37.1 C), temperature source Oral, resp. rate 24, height 5\' 3"  (1.6 m), weight 98.9 kg (218 lb 0.6 oz), SpO2 93 %.  Intake/Output Summary (Last 24 hours) at 01/12/15 1006 Last data filed at 01/12/15 0900  Gross per 24 hour  Intake  662.5 ml  Output      0 ml  Net  662.5 ml   Exam: General: No acute respiratory distress Lungs: Clear to auscultation bilaterally without wheezes or crackles Cardiovascular: Regular rate and rhythm without murmur gallop or rub normal S1 and S2 Abdomen: Nontender, nondistended, soft, bowel sounds positive, no rebound, no ascites, no appreciable mass Extremities: No significant cyanosis, clubbing, or edema bilateral lower extremities Neuro:  There is no facial drooping appreciated, the patient will follow the examiner with her eyes, the patient will only very occasionally answer questions with simple 1-3 word responses,  the majority of the time the patient is noncommunicative, though she is moving all 4 extremities spontaneously she does not follow commands  Data Reviewed: Basic Metabolic Panel:  Recent Labs Lab 01/10/15 2244 01/11/15 0333  NA 140 145  K 4.5 3.9    CL 102 106  CO2 29 31  GLUCOSE 101* 112*  BUN 21* 17  CREATININE 0.89 0.79  CALCIUM 9.2 9.3   Liver Function Tests:  Recent Labs Lab 01/10/15 2244 01/11/15 0333  AST 36 18  ALT 19 18  ALKPHOS 128* 121  BILITOT 1.3* 0.3  PROT 6.6 6.4*  ALBUMIN 3.4* 3.1*   CBC:  Recent Labs Lab 01/10/15 2244 01/11/15 0333  WBC 6.2 6.3  NEUTROABS 4.4  --   HGB 11.3* 10.2*  HCT 34.0* 31.2*  MCV 84.2 83.9  PLT 291 256   CBG:  Recent Labs Lab 01/11/15 0357  GLUCAP 102*    Recent Results (from the past 240 hour(s))  Culture, blood (routine x 2)     Status: None (Preliminary result)   Collection Time: 01/11/15 12:39 AM  Result Value Ref Range Status   Specimen Description BLOOD LEFT ARM  Final   Special Requests BOTTLES DRAWN AEROBIC AND ANAEROBIC 5CC EA  Final   Culture PENDING  Incomplete   Report Status PENDING  Incomplete  MRSA PCR Screening     Status: None   Collection Time: 01/11/15  2:45 AM  Result Value Ref Range Status   MRSA by PCR NEGATIVE NEGATIVE Final    Comment:        The GeneXpert MRSA Assay (FDA approved for NASAL specimens only), is one component of a comprehensive MRSA colonization surveillance program. It is not intended to diagnose MRSA infection nor to guide or monitor treatment for MRSA infections.      Studies:  Recent x-ray studies have been reviewed in detail by the Attending Physician  Scheduled Meds:  Scheduled Meds: . amantadine  100 mg Oral Daily  . baclofen  20 mg Oral TID  . cholecalciferol  1,000 Units Oral Daily  . darifenacin  7.5 mg Oral Daily  . docusate sodium  100 mg Oral BID  . DULoxetine  60 mg Oral Daily  . feeding supplement (PRO-STAT SUGAR FREE 64)  30 mL Oral BID  . gabapentin  900 mg Oral TID  . heparin  5,000 Units Subcutaneous 3 times per day  . imipramine  75 mg Oral QPM  . latanoprost  1 drop Both Eyes QHS  . multivitamin with minerals  1 tablet Oral Daily  . phenazopyridine  100 mg Oral TID  .  piperacillin-tazobactam  3.375 g Intravenous 3 times per day  . polyethylene glycol  17 g Oral BID  . sodium chloride  3 mL Intravenous Q12H    Time spent on care of this patient: 35 mins  Cherene Altes, MD [facility] For Consults/Admissions - Flow Manager - [phone] Office  [phone]  Contact MD directly via text page:      [email or web address]      password [facility]  01/12/2015, 10:06 AM   LOS: 1 day

## 2015-01-13 DIAGNOSIS — I10 Essential (primary) hypertension: Secondary | ICD-10-CM | POA: Diagnosis present

## 2015-01-13 DIAGNOSIS — M21372 Foot drop, left foot: Secondary | ICD-10-CM

## 2015-01-13 DIAGNOSIS — I5032 Chronic diastolic (congestive) heart failure: Secondary | ICD-10-CM | POA: Diagnosis present

## 2015-01-13 DIAGNOSIS — M21371 Foot drop, right foot: Secondary | ICD-10-CM | POA: Diagnosis present

## 2015-01-13 DIAGNOSIS — R945 Abnormal results of liver function studies: Secondary | ICD-10-CM | POA: Diagnosis present

## 2015-01-13 DIAGNOSIS — R7989 Other specified abnormal findings of blood chemistry: Secondary | ICD-10-CM

## 2015-01-13 DIAGNOSIS — R410 Disorientation, unspecified: Secondary | ICD-10-CM | POA: Diagnosis present

## 2015-01-13 LAB — CBC
HCT: 30.8 % — ABNORMAL LOW (ref 36.0–46.0)
HEMOGLOBIN: 10.1 g/dL — AB (ref 12.0–15.0)
MCH: 28.1 pg (ref 26.0–34.0)
MCHC: 32.8 g/dL (ref 30.0–36.0)
MCV: 85.6 fL (ref 78.0–100.0)
Platelets: 262 10*3/uL (ref 150–400)
RBC: 3.6 MIL/uL — ABNORMAL LOW (ref 3.87–5.11)
RDW: 15.9 % — AB (ref 11.5–15.5)
WBC: 5.4 10*3/uL (ref 4.0–10.5)

## 2015-01-13 LAB — BASIC METABOLIC PANEL
ANION GAP: 8 (ref 5–15)
BUN: 13 mg/dL (ref 6–20)
CHLORIDE: 103 mmol/L (ref 101–111)
CO2: 29 mmol/L (ref 22–32)
Calcium: 8.8 mg/dL — ABNORMAL LOW (ref 8.9–10.3)
Creatinine, Ser: 1.02 mg/dL — ABNORMAL HIGH (ref 0.44–1.00)
GFR calc Af Amer: >60 mL/min (ref >60)
GFR calc non Af Amer: >60 mL/min (ref >60)
Glucose, Bld: 92 mg/dL (ref 65–99)
POTASSIUM: 3.3 mmol/L — AB (ref 3.5–5.1)
Sodium: 140 mmol/L (ref 135–145)

## 2015-01-13 LAB — URINE CULTURE: Culture: >=100000

## 2015-01-13 LAB — GLUCOSE, CAPILLARY: GLUCOSE-CAPILLARY: 82 mg/dL (ref 65–99)

## 2015-01-13 LAB — RPR: RPR Ser Ql: NONREACTIVE

## 2015-01-13 LAB — HIV ANTIBODY (ROUTINE TESTING W REFLEX): HIV Screen 4th Generation wRfx: NONREACTIVE

## 2015-01-13 MED ORDER — WHITE PETROLATUM GEL
Status: AC
  Filled 2015-01-13: qty 1

## 2015-01-13 MED ORDER — POTASSIUM CHLORIDE CRYS ER 20 MEQ PO TBCR
40.0000 meq | EXTENDED_RELEASE_TABLET | Freq: Once | ORAL | Status: AC
  Administered 2015-01-13: 40 meq via ORAL
  Filled 2015-01-13: qty 2

## 2015-01-13 MED ORDER — CARVEDILOL 12.5 MG PO TABS
12.5000 mg | ORAL_TABLET | Freq: Two times a day (BID) | ORAL | Status: DC
  Administered 2015-01-13 – 2015-01-17 (×8): 12.5 mg via ORAL
  Filled 2015-01-13 (×9): qty 1

## 2015-01-13 NOTE — Progress Notes (Signed)
Patient ID: Shannon Garrett, female   DOB: 05-31-1961, 54 y.o.   MRN: [id number]    DATE: 01/06/15  Location:  [facility]    Place of Service: SNF [phone])   Extended Emergency Contact Information Primary Emergency Contact: [place], [place] [address] [place] Phone: [phone] Relation: Sister Secondary Emergency Contact: Jefm Bryant, [place] Phone: [phone] Mobile Phone: [phone] Relation: Friend  Advanced Directive information  DNR  Chief Complaint  Patient presents with  . Medical Management of Chronic Issues    HPI:  54 yo female long term resident seen today for f/u. She c/o feeling depressed today. She is a poor historian due to psych issues. Hx obtained from chart. No nursing issues. No falls. Appetite reduced. Sleeping poorly.  She has MS and has difficulty performing ADLs without assistance. She receives tysabri infusions and takes amantidine for MS. She cannot raise her arms above her head without great pain and effort. Her appetite is okay but she does not get a lot of exercise due to weakness in extremities. She has a hard time with PT. She c/o inability to make to restroom in time to void and has frequent accidents. She does not have adult briefs. Takes vesicare and oxybutynin. She also has muscle spasms and chronic pain and takes percocet and baclofen. She also takes gabapentin  HTN is stable on coreg and furosemide.  She has depression but not well controlled on cymbalta and imipramine. No SI/HI at this time. Poor sleep habits despite taking ambien  Heart failure controlled on lasix, coreg and spironolactone. No new SOB or CP.  seborrhea dermatitis of scalp - uses fluocinolone shampo weekly  Past Medical History  Diagnosis Date  . MS (multiple sclerosis) ([place])     a. Dx'd late 20's. b. Tx with Novantrone, Tysabri, Copaxone previously.  Marland Kitchen Hypertension     . Depression   . Glaucoma   . Cardiomyopathy ([place])     a.  Echo 04/29/12: Mild LVH, EF 20-25%, mild AI, moderate MR, moderate LAE, mild RAE, mild RVE, moderate TR, PASP 51, small pericardial effusion;   b. probably non-ischemic given multiple chemo-Tx agents used for MS and global LV dysfn on echo  . Chronic systolic heart failure [facility])     Past Surgical History  Procedure Laterality Date  . Ablation      uterine  . Cesarean section      Patient Care Team: Hennie Duos, MD as PCP - General (Internal Medicine) Gerlene Fee, NP as Nurse Practitioner (Nurse Practitioner)  Social History   Social History  . Marital Status: Single    Spouse Name: N/A  . Number of Children: N/A  . Years of Education: N/A   Occupational History  . Not on file.   Social History Main Topics  . Smoking status: Former Research scientist (life sciences)  . Smokeless tobacco: Never Used  . Alcohol Use: No  . Drug Use: No  . Sexual Activity: Not Currently   Other Topics Concern  . Not on file   Social History Narrative     reports that she has quit smoking. She has never used smokeless tobacco. She reports that she does not drink alcohol or use illicit drugs.  Immunization History  Administered Date(s) Administered  . Influenza,inj,Quad PF,36+ Mos 08/01/2013  . Influenza-Unspecified 08/31/2014  . Pneumococcal Polysaccharide-23 08/01/2013  .  Pneumococcal-Unspecified 08/30/2013, 07/29/2014    Allergies  Allergen Reactions  . Sulfonamide Derivatives Hives and Shortness Of Breath  . Penicillins Hives and Itching    Has patient had a PCN reaction causing immediate rash, facial/tongue/throat swelling, SOB or lightheadedness with hypotension: unknown Has patient had a PCN reaction causing severe rash involving mucus membranes or skin necrosis: unknown Has patient had a PCN reaction that required hospitalization: unknown Has patient had a PCN reaction occurring within the last 10 years: unknown If all of the above  answers are "NO", then may proceed with Cephalosporin use. Prior course of rocephin charted 05/2015    Medications: Patient's Medications  New Prescriptions   ACETAMINOPHEN (TYLENOL) 325 MG TABLET    Take 2 tablets (650 mg total) by mouth every 6 (six) hours as needed for mild pain or fever (or Fever >/= 101).   CIPROFLOXACIN (CIPRO) 500 MG TABLET    Take 1 tablet (500 mg total) by mouth 2 (two) times daily.   FEEDING SUPPLEMENT, ENSURE ENLIVE, (ENSURE ENLIVE) LIQD    Take 237 mLs by mouth 2 (two) times daily as needed (Please provide to pt if she she eats less than 50% of breakfast and dinner).   FUROSEMIDE (LASIX) 80 MG TABLET    Take 1 tablet (80 mg total) by mouth 2 (two) times daily.   ONDANSETRON (ZOFRAN) 4 MG TABLET    Take 1 tablet (4 mg total) by mouth every 6 (six) hours as needed for nausea.  Previous Medications   AMANTADINE (SYMMETREL) 100 MG CAPSULE    Take 100 mg by mouth daily.    BACLOFEN (LIORESAL) 20 MG TABLET    Take 20 mg by mouth 3 (three) times daily.   CHOLECALCIFEROL (VITAMIN D-3 PO)    Take 1 tablet by mouth daily.   CRANBERRY PO    Take 100 mg by mouth daily.   CYANOCOBALAMIN (VITAMIN B-12 PO)    Take 1 tablet by mouth daily.   DOCUSATE SODIUM (COLACE) 100 MG CAPSULE    Take 100 mg by mouth 2 (two) times daily.   DULOXETINE (CYMBALTA) 60 MG CAPSULE    Take 60 mg by mouth daily.   IMIPRAMINE (TOFRANIL) 50 MG TABLET    Take 1.5 tablets (75 mg total) by mouth every evening. For depressive disorder   LATANOPROST (XALATAN) 0.005 % OPHTHALMIC SOLUTION    Place 1 drop into both eyes at bedtime.   MAGNESIUM HYDROXIDE (MILK OF MAGNESIA) 400 MG/5ML SUSPENSION    Take 30 mLs by mouth every 8 (eight) hours as needed for mild constipation or moderate constipation.   POLYETHYLENE GLYCOL (MIRALAX / GLYCOLAX) PACKET    Take 17 g by mouth 2 (two) times daily.   POTASSIUM CHLORIDE (KLOR-CON) 20 MEQ PACKET    Take 20 mEq by mouth daily.   PROPYLENE GLYCOL (SYSTANE BALANCE) 0.6 % SOLN     Place 1 drop into both eyes 2 (two) times daily.   SOLIFENACIN (VESICARE) 10 MG TABLET    Take 10 mg by mouth daily. For bladder spasms   SPIRONOLACTONE (ALDACTONE) 25 MG TABLET    Take 25 mg by mouth daily. For CHF   TAMSULOSIN (FLOMAX) 0.4 MG CAPS CAPSULE    Take 0.4 mg by mouth daily. For incontinence  Modified Medications   Modified Medication Previous Medication   CARVEDILOL (COREG) 12.5 MG TABLET carvedilol (COREG) 25 MG tablet      Take 1 tablet (12.5 mg total) by mouth 2 (two) times daily with  a meal.    Take 1 tablet (25 mg total) by mouth 2 (two) times daily with a meal.   GABAPENTIN (NEURONTIN) 300 MG CAPSULE gabapentin (NEURONTIN) 300 MG capsule      Take 3 capsules (900 mg total) by mouth 3 (three) times daily.    Take 3 capsules (900 mg total) by mouth 3 (three) times daily.  Discontinued Medications   BACLOFEN (LIORESAL) 10 MG TABLET    Take 1 tablet (10 mg total) by mouth 3 (three) times daily.   FUROSEMIDE (LASIX) 80 MG TABLET    Take 1 tablet (80 mg total) by mouth 2 (two) times daily. Take 160 mg every morning and 80 mg po q HS   MULTIPLE VITAMIN (MULTIVITAMIN WITH MINERALS) TABS TABLET    Take 1 tablet by mouth daily.   OXYCODONE-ACETAMINOPHEN (PERCOCET) 7.5-325 MG PER TABLET    Take one tablet by mouth three times daily for pain. Not to exceed 3gm from all sources/24h   PHENAZOPYRIDINE (PYRIDIUM) 100 MG TABLET    Take 100 mg by mouth 3 (three) times daily.    POTASSIUM CHLORIDE (KLOR-CON) 20 MEQ PACKET    Take 40 mEq by mouth daily.    Review of Systems  Unable to perform ROS: Psychiatric disorder    Filed Vitals:   01/06/15 2301  BP: 105/67  Pulse: 89  Temp: 96.6 F (35.9 C)  Weight: 215 lb (97.523 kg)  SpO2: 97%   Body mass index is 39.31 kg/(m^2).  Physical Exam  Constitutional: She appears well-developed and well-nourished.  Sitting up in bed in NAD  HENT:  Mouth/Throat: Oropharynx is clear and moist. No oropharyngeal exudate.  Eyes: Pupils are  equal, round, and reactive to light. No scleral icterus.  Neck: Neck supple. Carotid bruit is not present. No tracheal deviation present. No thyromegaly present.  Cardiovascular: Normal rate, regular rhythm and intact distal pulses.  Exam reveals no gallop and no friction rub.   Murmur (1/6 SEM) heard. No LE edema b/l. no calf TTP.   Pulmonary/Chest: Effort normal and breath sounds normal. No stridor. No respiratory distress. She has no wheezes. She has no rales.  Abdominal: Soft. Bowel sounds are normal. She exhibits distension. She exhibits no mass. There is no hepatomegaly. There is no tenderness. There is no rebound and no guarding.  Musculoskeletal: She exhibits edema and tenderness.  Lymphadenopathy:    She has no cervical adenopathy.  Neurological: She is alert.  Skin: Skin is warm and dry. No rash noted.  Left ankle with dry small ulcer; soft shoe intact  Psychiatric: Her behavior is normal. She exhibits a depressed mood.     Labs reviewed: Abstract on 11/02/2014  Component Date Value Ref Range Status  . Hemoglobin 09/28/2014 10.0* 12.0 - 16.0 g/dL Final  . HCT 09/28/2014 32* 36 - 46 % Final  . Platelets 09/28/2014 323  150 - 399 K/L Final  . WBC 09/28/2014 5.7   Final  . Glucose 09/28/2014 87   Final  . BUN 09/28/2014 16  4 - 21 mg/dL Final  . Potassium 09/28/2014 3.5  3.4 - 5.3 mmol/L Final  . Sodium 09/28/2014 142  137 - 147 mmol/L Final    12-16-14: wbc 5.9; hgb 10.8; hct 35.6; mcv 87.4; plt 273; glucose 111; bun 39.1; creat 1.30; k+3.9; na++139; liver normal albumin 3.9; sed rate 64    Assessment/Plan   ICD-9-CM ICD-10-CM   1. Severe episode of recurrent major depressive disorder, without psychotic features ([place]) 296.33 F33.2  2. Chronic pain syndrome 338.4 G89.4   3. MULTIPLE SCLEROSIS, PROGRESSIVE/RELAPSING 340 G35   4. Paraplegia (HCC) 344.1 G82.20   5. Chronic combined systolic and diastolic congestive heart failure (HCC) 428.42 I50.42    428.0    6.  HYPERTENSION, BENIGN ESSENTIAL 401.1 I10   7. Chronic constipation 564.00 K59.00     refer to psych for MDD  F/u with neurology as scheduled  cont current meds as ordered  Wound care as indicated  Fluid restrict 1800 cc per day  PT/OT/ST as indicated  Will follow  Xavyer Steenson S. Perlie Gold  [facility] and Adult Medicine [address] [place], [place] [address] [phone] Cell (Monday-Friday 8 AM - 5 PM) [phone] After 5 PM and follow prompts

## 2015-01-13 NOTE — Care Management Important Message (Signed)
Important Message  Patient Details  Name: Shannon Garrett MRN: [id number] Date of Birth: 1961-05-07   Medicare Important Message Given:  Yes-second notification given    Delorse Lek 01/13/2015, 2:01 PM

## 2015-01-13 NOTE — Progress Notes (Signed)
Orthopedic Tech Progress Note Patient Details:  Shannon Garrett May 23, 1961 [id number]  Ortho Devices Type of Ortho Device: Postop shoe/boot Ortho Device/Splint Location: (B) prafo boots Ortho Device/Splint Interventions: Ordered, Application   Braulio Bosch 01/13/2015, 10:15 PM

## 2015-01-13 NOTE — Progress Notes (Signed)
[place] TEAM 1 - Stepdown/ICU TEAM Progress Note  Shannon Garrett WEX:[id number] DOB: [birth year] DOA: 01/10/2015 PCP: Gildardo Cranker, DO  Admit HPI / Brief Narrative: Shannon Garrett is a 54 y.o. BF PMHx Depression, Multiple Sclerosis, Cardiomyopathy (04/29/12 EF 20-25%), HTN, residing at [facility] with altered mental status. By history patient is normally awake and is able to carry on conversations. She has been noted to have decreased mentation according to the staff at the facility. She had received percocet and also had received baclofen. On arrival to the ED she was noted to be hypotensive. She had good response to fluid bolus in the ED. Patient is also on diuretics and her BUN was slightly elevated. The main concern in the ED was that she may have early SIRS or a UTI and so is being admitted for further antibiotics therapy and monitoring. Her urine shows Nitrite positive with small amount of leukocytes.  HPI/Subjective: 7/14 patient significantly improved from 7/12, A/O 4. Patient states her regular neurologist is Dr. Kerman Passey.  Negative dysarthria, negative in/V, negative CP, negative SOB, negative abdominal pain . Patient stated that she stays at [facility]. States that she is now unable to ambulate on her all and uses the [place] lift to get into chair/wheelchair. States initially she was able to ambulate with a walker ~1 year ago when she first entered SNF    Assessment/Plan: Urinary tract infection uncomplicated (enterococcus ) -cultures have been ordered including Blood and Urine -Continue empiric antibiotics antibiotics for possible UTI -Normal saline at 50 ml/hr -Monitor closely for fluid overload -Enterococcus not susceptible to Cephalosporins, and patient allergic to ampicillin. Will finish off antibiotics regimen on Zosyn.  Cardiomyopathy/Chronic Diastolic Heart Failure -Echocardiogram pending -Strict in and out; since admission + 3.6L -Daily a.m. weights on  standing scale; admission weight= 88.4 kg,      7/14 bed weight= 99.2 kg -Monitor closely for fluid overload  Altered Mental Status -Resolved  Hypovolemia -See UTI -will hold lasix and spironolactone  HTN -BP trending up will restart patient on lower dose Coreg 12.5 mg  BID (home dose 25 mg BID)  Multiple Sclerosis -Patient's admission MAR does not show patient to currently be on medication  -However monitor for decompensation secondary to acute illness  Abnormal LFT -Resolved  Foot drop bilateral -Ordered foot drop boot   Code Status: FULL Family Communication: no family present at time of exam Disposition Plan: Back to [place] [facility] SNF    Consultants: NA   Procedure/Significant Events: 7/12 CT head without contrast; No acute intracranial abnormalities. Mild atrophy. White matter disease compatible with history of MS   Culture 7/11 urine positive enterococcus  7/12 blood left arm/hand NGTD 7/12 MRSA by PCR negative    Antibiotics: Ceftriaxone 7/11>> stopped 7/12 Zosyn 7/12>>    DVT prophylaxis: Heparin subcutaneous   Devices NA   LINES / TUBES:      Continuous Infusions: . sodium chloride 50 mL/hr at 01/13/15 1800    Objective: VITAL SIGNS: Temp: 98.3 F (36.8 C) (07/14 1500) Temp Source: Oral (07/14 1500) BP: 162/75 mmHg (07/14 1100) Pulse Rate: 99 (07/14 1103) SPO2; FIO2:   Intake/Output Summary (Last 24 hours) at 01/13/15 1951 Last data filed at 01/13/15 1800  Gross per 24 hour  Intake   1390 ml  Output      0 ml  Net   1390 ml     Exam: General:  A/O 4, NAD, No acute respiratory distress Eyes: Negative headache, eye  pain, double vision, negative scleral hemorrhage ENT: Negative Runny nose, negative ear pain, negative tinnitus, negative gingival bleeding Neck:  Negative scars, masses, torticollis, lymphadenopathy, JVD Lungs: Clear to auscultation bilaterally without wheezes or crackles Cardiovascular: Regular rate and  rhythm without murmur gallop or rub normal S1 and S2 Abdomen: Morbidly obese, positive suprapubic abdominal pain, bilateral CVA tenderness, negative dysphagia,  soft, bowel sounds positive, no rebound, no ascites, no appreciable mass Extremities: No significant cyanosis, clubbing, or edema bilateral lower extremities, bilateral foot drop Psychiatric:  Negative depression, negative anxiety, negative fatigue, negative mania  Neurologic:  Cranial nerves II through XII intact, tongue/uvula midline, all extremities muscle strength 5/5, sensation intact throughout, negative dysarthria, negative expressive aphasia, negative receptive aphasia.      Data Reviewed: Basic Metabolic Panel:  Recent Labs Lab 01/10/15 2244 01/11/15 0333 01/13/15 0306  NA 140 145 140  K 4.5 3.9 3.3*  CL 102 106 103  CO2 29 31 29   GLUCOSE 101* 112* 92  BUN 21* 17 13  CREATININE 0.89 0.79 1.02*  CALCIUM 9.2 9.3 8.8*   Liver Function Tests:  Recent Labs Lab 01/10/15 2244 01/11/15 0333  AST 36 18  ALT 19 18  ALKPHOS 128* 121  BILITOT 1.3* 0.3  PROT 6.6 6.4*  ALBUMIN 3.4* 3.1*   No results for input(s): LIPASE, AMYLASE in the last 168 hours.  Recent Labs Lab 01/12/15 2111  AMMONIA 30   CBC:  Recent Labs Lab 01/10/15 2244 01/11/15 0333 01/13/15 0306  WBC 6.2 6.3 5.4  NEUTROABS 4.4  --   --   HGB 11.3* 10.2* 10.1*  HCT 34.0* 31.2* 30.8*  MCV 84.2 83.9 85.6  PLT 291 256 262   Cardiac Enzymes: No results for input(s): CKTOTAL, CKMB, CKMBINDEX, TROPONINI in the last 168 hours. BNP (last 3 results) No results for input(s): BNP in the last 8760 hours.  ProBNP (last 3 results)  Recent Labs  02/09/14 1630 02/19/14 2312 05/23/14 1935  PROBNP 34.0 275.9* 55.3    CBG:  Recent Labs Lab 01/11/15 0357 01/13/15 0835  GLUCAP 102* 82    Recent Results (from the past 240 hour(s))  Urine culture     Status: None   Collection Time: 01/10/15 11:11 PM  Result Value Ref Range Status    Specimen Description URINE, RANDOM  Final   Special Requests ADDED 01/11/15 0144  Final   Culture >=100,000 COLONIES/mL ENTEROCOCCUS SPECIES  Final   Report Status 01/13/2015 FINAL  Final   Organism ID, Bacteria ENTEROCOCCUS SPECIES  Final      Susceptibility   Enterococcus species - MIC*    AMPICILLIN <=2 SENSITIVE Sensitive     LEVOFLOXACIN >=8 RESISTANT Resistant     NITROFURANTOIN <=16 SENSITIVE Sensitive     VANCOMYCIN 1 SENSITIVE Sensitive     LINEZOLID 1 SENSITIVE Sensitive     * >=100,000 COLONIES/mL ENTEROCOCCUS SPECIES  Culture, blood (routine x 2)     Status: None (Preliminary result)   Collection Time: 01/11/15 12:39 AM  Result Value Ref Range Status   Specimen Description BLOOD LEFT ARM  Final   Special Requests BOTTLES DRAWN AEROBIC AND ANAEROBIC 5CC EA  Final   Culture NO GROWTH 2 DAYS  Final   Report Status PENDING  Incomplete  Culture, blood (routine x 2)     Status: None (Preliminary result)   Collection Time: 01/11/15 12:45 AM  Result Value Ref Range Status   Specimen Description BLOOD LEFT HAND  Final   Special Requests BOTTLES DRAWN  AEROBIC ONLY 10CC  Final   Culture NO GROWTH 2 DAYS  Final   Report Status PENDING  Incomplete  MRSA PCR Screening     Status: None   Collection Time: 01/11/15  2:45 AM  Result Value Ref Range Status   MRSA by PCR NEGATIVE NEGATIVE Final    Comment:        The GeneXpert MRSA Assay (FDA approved for NASAL specimens only), is one component of a comprehensive MRSA colonization surveillance program. It is not intended to diagnose MRSA infection nor to guide or monitor treatment for MRSA infections.      Studies:  Recent x-ray studies have been reviewed in detail by the Attending Physician  Scheduled Meds:  Scheduled Meds: . amantadine  100 mg Oral Daily  . baclofen  20 mg Oral TID  . carvedilol  12.5 mg Oral BID WC  . cholecalciferol  1,000 Units Oral Daily  . darifenacin  7.5 mg Oral Daily  . docusate sodium  100 mg  Oral BID  . DULoxetine  60 mg Oral Daily  . feeding supplement (PRO-STAT SUGAR FREE 64)  30 mL Oral BID  . gabapentin  900 mg Oral TID  . heparin  5,000 Units Subcutaneous 3 times per day  . imipramine  75 mg Oral QPM  . latanoprost  1 drop Both Eyes QHS  . multivitamin with minerals  1 tablet Oral Daily  . phenazopyridine  100 mg Oral TID  . piperacillin-tazobactam  3.375 g Intravenous 3 times per day  . polyethylene glycol  17 g Oral BID  . potassium chloride  40 mEq Oral Once  . sodium chloride  3 mL Intravenous Q12H    Time spent on care of this patient: 40 mins   Barbi Kumagai, Geraldo Docker , MD  [facility] Office  [phone] Pager - [phone]  On-Call/Text Page:      [email or web address]      password [email or web address]  If 7PM-7AM, please contact night-coverage [email or web address] Password [email or web address] 01/13/2015, 7:51 PM   LOS: 2 days   Care during the described time interval was provided by me .  I have reviewed this patient's available data, including medical history, events of note, physical examination, and all test results as part of my evaluation. I have personally reviewed and interpreted all radiology studies.   Dia Crawford, MD [phone] Pager

## 2015-01-14 ENCOUNTER — Encounter: Payer: Self-pay | Admitting: *Deleted

## 2015-01-14 LAB — CBC WITH DIFFERENTIAL/PLATELET
BASOS PCT: 1 % (ref 0–1)
Basophils Absolute: 0 10*3/uL (ref 0.0–0.1)
Eosinophils Absolute: 0.2 10*3/uL (ref 0.0–0.7)
Eosinophils Relative: 4 % (ref 0–5)
HEMATOCRIT: 29.2 % — AB (ref 36.0–46.0)
Hemoglobin: 9.7 g/dL — ABNORMAL LOW (ref 12.0–15.0)
LYMPHS ABS: 0.7 10*3/uL (ref 0.7–4.0)
Lymphocytes Relative: 12 % (ref 12–46)
MCH: 27.9 pg (ref 26.0–34.0)
MCHC: 33.2 g/dL (ref 30.0–36.0)
MCV: 83.9 fL (ref 78.0–100.0)
MONOS PCT: 9 % (ref 3–12)
Monocytes Absolute: 0.5 10*3/uL (ref 0.1–1.0)
NEUTROS ABS: 4.7 10*3/uL (ref 1.7–7.7)
NEUTROS PCT: 74 % (ref 43–77)
Platelets: 245 10*3/uL (ref 150–400)
RBC: 3.48 MIL/uL — ABNORMAL LOW (ref 3.87–5.11)
RDW: 15.8 % — ABNORMAL HIGH (ref 11.5–15.5)
WBC: 6.3 10*3/uL (ref 4.0–10.5)

## 2015-01-14 LAB — MAGNESIUM: Magnesium: 2.2 mg/dL (ref 1.7–2.4)

## 2015-01-14 LAB — COMPREHENSIVE METABOLIC PANEL
ALK PHOS: 119 U/L (ref 38–126)
ALT: 23 U/L (ref 14–54)
AST: 23 U/L (ref 15–41)
Albumin: 2.5 g/dL — ABNORMAL LOW (ref 3.5–5.0)
Anion gap: 7 (ref 5–15)
BUN: 12 mg/dL (ref 6–20)
CO2: 26 mmol/L (ref 22–32)
Calcium: 8.4 mg/dL — ABNORMAL LOW (ref 8.9–10.3)
Chloride: 110 mmol/L (ref 101–111)
Creatinine, Ser: 0.92 mg/dL (ref 0.44–1.00)
GFR calc Af Amer: >60 mL/min (ref >60)
Glucose, Bld: 111 mg/dL — ABNORMAL HIGH (ref 65–99)
Potassium: 3.7 mmol/L (ref 3.5–5.1)
Sodium: 143 mmol/L (ref 135–145)
TOTAL PROTEIN: 5.4 g/dL — AB (ref 6.5–8.1)
Total Bilirubin: 0.6 mg/dL (ref 0.3–1.2)

## 2015-01-14 LAB — GLUCOSE, CAPILLARY: GLUCOSE-CAPILLARY: 104 mg/dL — AB (ref 65–99)

## 2015-01-14 MED ORDER — TRAMADOL HCL 50 MG PO TABS
50.0000 mg | ORAL_TABLET | Freq: Four times a day (QID) | ORAL | Status: DC | PRN
  Administered 2015-01-15 – 2015-01-17 (×6): 50 mg via ORAL
  Filled 2015-01-14 (×6): qty 1

## 2015-01-14 MED ORDER — SODIUM CHLORIDE 0.9 % IV BOLUS (SEPSIS)
500.0000 mL | Freq: Once | INTRAVENOUS | Status: AC
Start: 2015-01-14 — End: 2015-01-14
  Administered 2015-01-14: 500 mL via INTRAVENOUS

## 2015-01-14 MED ORDER — NITROFURANTOIN MONOHYD MACRO 100 MG PO CAPS
100.0000 mg | ORAL_CAPSULE | Freq: Two times a day (BID) | ORAL | Status: DC
  Administered 2015-01-14 – 2015-01-17 (×6): 100 mg via ORAL
  Filled 2015-01-14 (×8): qty 1

## 2015-01-14 MED ORDER — LORAZEPAM 2 MG/ML IJ SOLN
1.0000 mg | Freq: Once | INTRAMUSCULAR | Status: AC
  Administered 2015-01-14: 1 mg via INTRAVENOUS
  Filled 2015-01-14: qty 1

## 2015-01-14 MED ORDER — WHITE PETROLATUM GEL
Status: AC
Start: 2015-01-14 — End: 2015-01-14
  Administered 2015-01-14: 23:00:00
  Filled 2015-01-14: qty 1

## 2015-01-14 NOTE — Progress Notes (Signed)
Speech Language Pathology Treatment: Dysphagia  Patient Details Name: Shannon Garrett MRN: [id number] DOB: [birth year] Today's Date: 01/14/2015 Time: 1572-6203 SLP Time Calculation (min) (ACUTE ONLY): 20 min  Assessment / Plan / Recommendation Clinical Impression  Pt had moderate oral residue in oral cavity when SLP arrived; oral care was completed as pt was unaware of residue; skilled observation with tactile/verbal cues required to increase oral manipulation and A->P transit time with puree and 1/5 of cracker consistency with clearance noted without s/s of aspiration; pt also consumed liquids via straw/cup during observation without difficulty noted; pt was speaking in jargon or unrelated topics during treatment session and nursing stated this is her baseline; recommend continue Dysphagia 1/thin d/t mentation and decreased awareness of residue at present time; ST will continue to f/u for possible diet upgrade   HPI Other Pertinent Information: Maevyn Riordan is a 54 y.o. female with history of MS residing at [facility] presents with altered mental status. By history patient is normally awake and is able to carry on conversations. She has been noted to have decreased mentation according to the staff at the facility. She had received percocet and also had received baclofen. On arrival to the ED she was noted to be hypotensive. She had good response to fluid bolus in the ED. Patient is also on diuretics and her BUN was slightly elevated. The main concern in the ED was that she may have early SIRS or a UTI and so is being admitted for further antibiotics therapy and monitoring. Her urine shows Nitrite positive with small amount of leukocytes.   Pertinent Vitals Pain Assessment: No/denies pain  SLP Plan  Continue with current plan of care    Recommendations Diet recommendations: Dysphagia 1 (puree);Thin liquid Liquids provided via: Cup;Straw Medication Administration: Crushed with puree Supervision:  Staff to assist with self feeding Compensations: Slow rate;Small sips/bites Postural Changes and/or Swallow Maneuvers: Seated upright 90 degrees              Oral Care Recommendations: Oral care BID Follow up Recommendations: Skilled Nursing facility Plan: Continue with current plan of care         Rainn Bullinger,PAT, M.S., CCC-SLP 01/14/2015, 2:38 PM

## 2015-01-14 NOTE — Progress Notes (Signed)
[place] TEAM 1 - Stepdown/ICU TEAM Progress Note  Shannon Garrett CWC:[id number] DOB: [birth year] DOA: 01/10/2015 PCP: Gildardo Cranker, DO  Admit HPI / Brief Narrative: 54 y.o. F w/ a Hx of Depression, Multiple Sclerosis, Cardiomyopathy (04/29/12 EF 20-25%), and HTN, who resides at [facility] who presented with altered mental status. By history patient is normally able to carry on a conversation. She had received percocet and baclofen.   On arrival to the ED she was noted to be hypotensive. Her BUN was slightly elevated. Her urine noted Nitrite positive with small amount of leukocytes.  HPI/Subjective: The patient is much more alert today but she is not yet back to baseline.  With extended conversation it is clear that she remains mildly confused but she is in fact dramatically improved compared to 7/13.  She denies chest pain nausea vomiting shortness of breath or abdominal pain.  Assessment/Plan:  Altered Mental Status The patient is improving rapidly with treatment of her urinary tract infection suggesting TME as the etiology - a CT of the head was without acute findings but confirmed white matter disease consistent with MS - her neurologic exam does not suggest a focal CVA - TSH is normal - V61, folic acid, ammonia, and RPR levels are all normal/negative   Enterococcus Urinary tract infection -pt is allergic to PCN - organism susceptible to macrodantin, vanc, linezolid but resistant to cipro/leva - transition to Macrodantin and follow clinically  Dysarthria Unclear of baseline - SLP suggests D1 diet w/ thin liquids appropriate - improving with treatment of above  Chronic Diastolic Heart Failure Appears well compensated at the present time - follow daily weights and I's and O's  Hypovolemia -hold lasix and spironolactone until oral intake improves - currently appears euvolemic  HTN -hypotensive on presentation - blood pressure now stabilizing - follow trend without change in  treatment today  Multiple Sclerosis -Patient's admission MAR does not show MS medication - is followed at [facility] - per office notes she is not ambulatory at baseline - she has frequent bladder spasms and urinary incontinence - plans are currently underway to resume Tysabri - her baclofen and percocet were increased on 01/07/2015  Abnormal LFT -Resolved  Code Status: FULL Family Communication: Spoke with patient and her family member at the bedside Disposition Plan: Transfer to medical bed - transition to oral antibiotics - continue to follow mental status - possible candidate for return to SNF in 48 hours  Consultants: NA  Procedures: 7/12 CT head without contrast No acute intracranial abnormalities. Mild atrophy. White matter disease compatible with history of MS 7/13 TTE - EF 60-65 % - no regional wall motion abnormalities - grade 1 diastolic dysfunction  Antibiotics: Ceftriaxone 7/11 Zosyn 7/11 > 7/15 Macrodantin 7/15 >  DVT prophylaxis: Heparin subcutaneous  Objective: Blood pressure 119/63, pulse 101, temperature 98.3 F (36.8 C), temperature source Oral, resp. rate 15, height 5\' 3"  (1.6 m), weight 99.383 kg (219 lb 1.6 oz), SpO2 98 %.  Intake/Output Summary (Last 24 hours) at 01/14/15 1621 Last data filed at 01/14/15 1300  Gross per 24 hour  Intake   1345 ml  Output      0 ml  Net   1345 ml   Exam: General: No acute respiratory distress - alert and conversant and jovial Lungs: Clear to auscultation bilaterally without wheezes or crackles Cardiovascular: Regular rate and rhythm without murmur gallop or rub  Abdomen: Nontender, nondistended, soft, bowel sounds positive, no rebound, no ascites, no appreciable mass Extremities: No significant  cyanosis, clubbing, edema bilateral lower extremities  Data Reviewed: Basic Metabolic Panel:  Recent Labs Lab 01/10/15 2244 01/11/15 0333 01/13/15 0306 01/14/15 0912  NA 140 145 140 143  K 4.5 3.9 3.3* 3.7  CL 102 106 103 110   CO2 29 31 29 26   GLUCOSE 101* 112* 92 111*  BUN 21* 17 13 12   CREATININE 0.89 0.79 1.02* 0.92  CALCIUM 9.2 9.3 8.8* 8.4*  MG  --   --   --  2.2   Liver Function Tests:  Recent Labs Lab 01/10/15 2244 01/11/15 0333 01/14/15 0912  AST 36 18 23  ALT 19 18 23   ALKPHOS 128* 121 119  BILITOT 1.3* 0.3 0.6  PROT 6.6 6.4* 5.4*  ALBUMIN 3.4* 3.1* 2.5*   CBC:  Recent Labs Lab 01/10/15 2244 01/11/15 0333 01/13/15 0306 01/14/15 0912  WBC 6.2 6.3 5.4 6.3  NEUTROABS 4.4  --   --  4.7  HGB 11.3* 10.2* 10.1* 9.7*  HCT 34.0* 31.2* 30.8* 29.2*  MCV 84.2 83.9 85.6 83.9  PLT 291 256 262 245   CBG:  Recent Labs Lab 01/11/15 0357 01/13/15 0835 01/14/15 0800  GLUCAP 102* 82 104*    Recent Results (from the past 240 hour(s))  Urine culture     Status: None   Collection Time: 01/10/15 11:11 PM  Result Value Ref Range Status   Specimen Description URINE, RANDOM  Final   Special Requests ADDED 01/11/15 0144  Final   Culture >=100,000 COLONIES/mL ENTEROCOCCUS SPECIES  Final   Report Status 01/13/2015 FINAL  Final   Organism ID, Bacteria ENTEROCOCCUS SPECIES  Final      Susceptibility   Enterococcus species - MIC*    AMPICILLIN <=2 SENSITIVE Sensitive     LEVOFLOXACIN >=8 RESISTANT Resistant     NITROFURANTOIN <=16 SENSITIVE Sensitive     VANCOMYCIN 1 SENSITIVE Sensitive     LINEZOLID 1 SENSITIVE Sensitive     * >=100,000 COLONIES/mL ENTEROCOCCUS SPECIES  Culture, blood (routine x 2)     Status: None (Preliminary result)   Collection Time: 01/11/15 12:39 AM  Result Value Ref Range Status   Specimen Description BLOOD LEFT ARM  Final   Special Requests BOTTLES DRAWN AEROBIC AND ANAEROBIC 5CC EA  Final   Culture NO GROWTH 3 DAYS  Final   Report Status PENDING  Incomplete  Culture, blood (routine x 2)     Status: None (Preliminary result)   Collection Time: 01/11/15 12:45 AM  Result Value Ref Range Status   Specimen Description BLOOD LEFT HAND  Final   Special Requests BOTTLES  DRAWN AEROBIC ONLY 10CC  Final   Culture NO GROWTH 3 DAYS  Final   Report Status PENDING  Incomplete  MRSA PCR Screening     Status: None   Collection Time: 01/11/15  2:45 AM  Result Value Ref Range Status   MRSA by PCR NEGATIVE NEGATIVE Final    Comment:        The GeneXpert MRSA Assay (FDA approved for NASAL specimens only), is one component of a comprehensive MRSA colonization surveillance program. It is not intended to diagnose MRSA infection nor to guide or monitor treatment for MRSA infections.      Studies:  Recent x-ray studies have been reviewed in detail by the Attending Physician  Scheduled Meds:  Scheduled Meds: . amantadine  100 mg Oral Daily  . baclofen  20 mg Oral TID  . carvedilol  12.5 mg Oral BID WC  . cholecalciferol  1,000 Units Oral Daily  . darifenacin  7.5 mg Oral Daily  . docusate sodium  100 mg Oral BID  . DULoxetine  60 mg Oral Daily  . feeding supplement (PRO-STAT SUGAR FREE 64)  30 mL Oral BID  . gabapentin  900 mg Oral TID  . heparin  5,000 Units Subcutaneous 3 times per day  . imipramine  75 mg Oral QPM  . latanoprost  1 drop Both Eyes QHS  . multivitamin with minerals  1 tablet Oral Daily  . piperacillin-tazobactam  3.375 g Intravenous 3 times per day  . polyethylene glycol  17 g Oral BID  . sodium chloride  3 mL Intravenous Q12H    Time spent on care of this patient: 35 mins  Cherene Altes, MD [facility] For Consults/Admissions - Flow Manager - [phone] Office  [phone]  Contact MD directly via text page:      [email or web address]      password [email or web address]  01/14/2015, 4:21 PM   LOS: 3 days

## 2015-01-14 NOTE — Clinical Social Work Note (Signed)
CSW reviewed chart. Pt is hallucinating. Pt come from [facility]. Once pt is medically stable she can return back to [facility].   [place], MSW, [place]
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
## 2015-01-14 NOTE — Progress Notes (Signed)
PT Cancellation Note  Patient Details Name: Shannon Garrett MRN: [id number] DOB: Oct 20, 1960   Cancelled Treatment:    Reason Eval/Treat Not Completed: Other (comment) (Pt. on strict bedrest, will need increased activity orders before proceeding with PT eval.  I have amion  texted Dr. Thereasa Solo to request increasing activity when appropriate.     Ladona Ridgel 01/14/2015, 11:09 AM Gerlean Ren PT Acute Rehab Services [phone] [place] [phone]

## 2015-01-14 NOTE — Progress Notes (Signed)
OT Cancellation Note  Patient Details Name: Shannon Garrett MRN: [id number] DOB: 1960-09-04   Cancelled Treatment:    Reason Eval/Treat Not Completed: Other (comment). Pt on strict bedrest, per PT note they have already notified attending about possibility to increasing activity orders. Will await clarification.  Almon Register [phone] 01/14/2015, 12:08 PM

## 2015-01-14 NOTE — Progress Notes (Signed)
ANTIBIOTIC CONSULT NOTE - FOLLOW UP  Pharmacy Consult for Zosyn Indication: UTI  Allergies  Allergen Reactions  . Sulfonamide Derivatives Hives and Shortness Of Breath  . Penicillins Hives and Itching    Patient Measurements: Height: 5\' 3"  (160 cm) Weight: 219 lb 1.6 oz (99.383 kg) IBW/kg (Calculated) : 52.4  Vital Signs: Temp: 98.3 F (36.8 C) (07/15 0749) Temp Source: Oral (07/15 0749) BP: 119/63 mmHg (07/15 0749) Pulse Rate: 101 (07/15 0751) Intake/Output from previous day: 07/14 0701 - 07/15 0700 In: 1705 [P.O.:440; I.V.:1165; IV Piggyback:100] Out: -  Intake/Output from this shift:    Labs:  Recent Labs  01/13/15 0306  WBC 5.4  HGB 10.1*  PLT 262  CREATININE 1.02*   Estimated Creatinine Clearance: 72.5 mL/min (by C-G formula based on Cr of 1.02).  Assessment: 54yof continues on day #4 zosyn for an enterococcal UTI. Sensitivities are back however she is allergic to ampicillin so will complete antibiotic course with zosyn. Renal function is overall stable and dose remains appropriate.  7/12 Zosyn>> 7/11 urine>> enterococcus (S- ampicillin, linezolid, vanc, nitro, R-levaquin) 7/12 blood x2>> ngtd  Goal of Therapy:  Appropriate dosing  Plan:  1) Continue zosyn 3.375g IV q8 (4 hour infusion) 2) Follow up LOT  Deboraha Sprang 01/14/2015,8:54 AM

## 2015-01-14 NOTE — Progress Notes (Signed)
Report called to RN, [address], all questions answered, no complaints.

## 2015-01-15 DIAGNOSIS — G9340 Encephalopathy, unspecified: Principal | ICD-10-CM

## 2015-01-15 DIAGNOSIS — I10 Essential (primary) hypertension: Secondary | ICD-10-CM

## 2015-01-15 DIAGNOSIS — N390 Urinary tract infection, site not specified: Secondary | ICD-10-CM

## 2015-01-15 NOTE — Progress Notes (Signed)
Progress Note  Shannon Garrett YQI:[id number] DOB: Dec 27, 1960 DOA: 01/10/2015 PCP: Gildardo Cranker, DO  Brief Narrative: 54 y.o. F w/ a Hx of Depression, Multiple Sclerosis, Cardiomyopathy (04/29/12 EF 20-25%), and HTN, who resides at [facility] who presented with altered mental status. By history patient is normally able to carry on a conversation. She had received percocet and baclofen.   On arrival to the ED she was noted to be hypotensive. Her BUN was slightly elevated. Her urine noted Nitrite positive with small amount of leukocytes.  Subjective: Patient more alert today. She complains of pain but unable to tell me the location of her pain.   Assessment/Plan:  Acute encephalopathy Likely secondary to UTI. She seems to be improving. May not be quite yet at baseline. CT of the head was without acute findings but confirmed white matter disease consistent with MS. Her neurologic exam did not suggest a focal CVA. TSH is normal. L87, folic acid, ammonia, and RPR levels are all normal/negative.  Enterococcus Urinary tract infection Patient was initially on Zosyn and was transitioned to Macrodantin based on sensitivities and patient's allergies.  Transient Dysarthria Seems to have improved as well. On dysphagia 1 diet per SLP.  Chronic Diastolic Heart Failure Appears well compensated at the present time - follow daily weights and I's and O's  Hypovolemia Holding lasix and spironolactone until oral intake improves. Currently appears euvolemic  History of essential HTN Hypotensive on presentation. Blood pressures are now stable.   History of Multiple Sclerosis Patient's admission MAR does not show MS medication - is followed at [place] - per office notes she is not ambulatory at baseline - she has frequent bladder spasms and urinary incontinence - plans are currently underway to resume Tysabri - her baclofen and percocet were increased on 01/07/2015.  Abnormal LFT Resolved  DVT  prophylaxis: Heparin subcutaneous Code Status: FULL Family Communication: No family at bedside. Tried calling family with no response. Disposition Plan: Continue current management. Anticipate discharge to skilled nursing facility on Monday.   Consultants: NA  Procedures: 7/12 CT head without contrast No acute intracranial abnormalities. Mild atrophy. White matter disease compatible with history of MS 7/13 TTE - EF 60-65 % - no regional wall motion abnormalities - grade 1 diastolic dysfunction  Antibiotics: Ceftriaxone 7/11 Zosyn 7/11 > 7/15 Macrodantin 7/15 >    Objective: Blood pressure 120/68, pulse 84, temperature 98.4 F (36.9 C), temperature source Axillary, resp. rate 17, height 5\' 3"  (1.6 m), weight 99.29 kg (218 lb 14.3 oz), SpO2 99 %.  Intake/Output Summary (Last 24 hours) at 01/15/15 0849 Last data filed at 01/15/15 5643  Gross per 24 hour  Intake   1180 ml  Output      0 ml  Net   1180 ml   Exam: General: No acute respiratory distress - alert and conversant and jovial Lungs: Diminished air entry at the bases but clear to auscultation.  Cardiovascular: Regular rate and rhythm without murmur gallop or rub  Abdomen: Nontender, nondistended, soft, bowel sounds positive, no rebound, no ascites, no appreciable mass Extremities: No significant cyanosis, clubbing, edema bilateral lower extremities  Data Reviewed: Basic Metabolic Panel:  Recent Labs Lab 01/10/15 2244 01/11/15 0333 01/13/15 0306 01/14/15 0912  NA 140 145 140 143  K 4.5 3.9 3.3* 3.7  CL 102 106 103 110  CO2 29 31 29 26   GLUCOSE 101* 112* 92 111*  BUN 21* 17 13 12   CREATININE 0.89 0.79 1.02* 0.92  CALCIUM 9.2 9.3 8.8* 8.4*  MG  --   --   --  2.2   Liver Function Tests:  Recent Labs Lab 01/10/15 2244 01/11/15 0333 01/14/15 0912  AST 36 18 23  ALT 19 18 23   ALKPHOS 128* 121 119  BILITOT 1.3* 0.3 0.6  PROT 6.6 6.4* 5.4*  ALBUMIN 3.4* 3.1* 2.5*   CBC:  Recent Labs Lab  01/10/15 2244 01/11/15 0333 01/13/15 0306 01/14/15 0912  WBC 6.2 6.3 5.4 6.3  NEUTROABS 4.4  --   --  4.7  HGB 11.3* 10.2* 10.1* 9.7*  HCT 34.0* 31.2* 30.8* 29.2*  MCV 84.2 83.9 85.6 83.9  PLT 291 256 262 245   CBG:  Recent Labs Lab 01/11/15 0357 01/13/15 0835 01/14/15 0800  GLUCAP 102* 82 104*    Recent Results (from the past 240 hour(s))  Urine culture     Status: None   Collection Time: 01/10/15 11:11 PM  Result Value Ref Range Status   Specimen Description URINE, RANDOM  Final   Special Requests ADDED 01/11/15 0144  Final   Culture >=100,000 COLONIES/mL ENTEROCOCCUS SPECIES  Final   Report Status 01/13/2015 FINAL  Final   Organism ID, Bacteria ENTEROCOCCUS SPECIES  Final      Susceptibility   Enterococcus species - MIC*    AMPICILLIN <=2 SENSITIVE Sensitive     LEVOFLOXACIN >=8 RESISTANT Resistant     NITROFURANTOIN <=16 SENSITIVE Sensitive     VANCOMYCIN 1 SENSITIVE Sensitive     LINEZOLID 1 SENSITIVE Sensitive     * >=100,000 COLONIES/mL ENTEROCOCCUS SPECIES  Culture, blood (routine x 2)     Status: None (Preliminary result)   Collection Time: 01/11/15 12:39 AM  Result Value Ref Range Status   Specimen Description BLOOD LEFT ARM  Final   Special Requests BOTTLES DRAWN AEROBIC AND ANAEROBIC 5CC EA  Final   Culture NO GROWTH 3 DAYS  Final   Report Status PENDING  Incomplete  Culture, blood (routine x 2)     Status: None (Preliminary result)   Collection Time: 01/11/15 12:45 AM  Result Value Ref Range Status   Specimen Description BLOOD LEFT HAND  Final   Special Requests BOTTLES DRAWN AEROBIC ONLY 10CC  Final   Culture NO GROWTH 3 DAYS  Final   Report Status PENDING  Incomplete  MRSA PCR Screening     Status: None   Collection Time: 01/11/15  2:45 AM  Result Value Ref Range Status   MRSA by PCR NEGATIVE NEGATIVE Final    Comment:        The GeneXpert MRSA Assay (FDA approved for NASAL specimens only), is one component of a comprehensive MRSA  colonization surveillance program. It is not intended to diagnose MRSA infection nor to guide or monitor treatment for MRSA infections.        Scheduled Meds:  Scheduled Meds: . amantadine  100 mg Oral Daily  . baclofen  20 mg Oral TID  . carvedilol  12.5 mg Oral BID WC  . cholecalciferol  1,000 Units Oral Daily  . darifenacin  7.5 mg Oral Daily  . docusate sodium  100 mg Oral BID  . DULoxetine  60 mg Oral Daily  . feeding supplement (PRO-STAT SUGAR FREE 64)  30 mL Oral BID  . gabapentin  900 mg Oral TID  . heparin  5,000 Units Subcutaneous 3 times per day  . imipramine  75 mg Oral QPM  . latanoprost  1 drop Both Eyes QHS  . multivitamin with minerals  1 tablet  Oral Daily  . nitrofurantoin (macrocrystal-monohydrate)  100 mg Oral Q12H  . polyethylene glycol  17 g Oral BID  . sodium chloride  3 mL Intravenous Q12H    Time spent on care of this patient: 35 mins  [place] - [phone] Office  [phone]  Contact MD directly via text page:      [email or web address]      password [email or web address]  01/15/2015, 8:49 AM   LOS: 4 days

## 2015-01-16 DIAGNOSIS — I5032 Chronic diastolic (congestive) heart failure: Secondary | ICD-10-CM

## 2015-01-16 DIAGNOSIS — B952 Enterococcus as the cause of diseases classified elsewhere: Secondary | ICD-10-CM

## 2015-01-16 LAB — CULTURE, BLOOD (ROUTINE X 2)
Culture: NO GROWTH
Culture: NO GROWTH

## 2015-01-16 MED ORDER — NITROFURANTOIN MONOHYD MACRO 100 MG PO CAPS: 100.0000 mg | ORAL_CAPSULE | Freq: Two times a day (BID) | ORAL | Status: DC

## 2015-01-16 MED ORDER — CARVEDILOL 12.5 MG PO TABS: 12.5000 mg | ORAL_TABLET | Freq: Two times a day (BID) | ORAL | Status: DC

## 2015-01-16 MED ORDER — FUROSEMIDE 40 MG PO TABS: 40.0000 mg | ORAL_TABLET | Freq: Every day | ORAL | Status: DC

## 2015-01-16 MED ORDER — POTASSIUM CHLORIDE 20 MEQ PO PACK: 20.0000 meq | PACK | Freq: Every day | ORAL | Status: DC

## 2015-01-16 NOTE — Progress Notes (Signed)
Progress Note  Phuong Hillary UGQ:[id number] DOB: Aug 08, 1960 DOA: 01/10/2015 PCP: Gildardo Cranker, DO  Brief Narrative: 54 y.o. F w/ a Hx of Depression, Multiple Sclerosis, Cardiomyopathy (04/29/12 EF 20-25%), and HTN, who resides at [facility] who presented with altered mental status. By history patient is normally able to carry on a conversation. She had received percocet and baclofen. On arrival to the ED she was noted to be hypotensive. Her BUN was slightly elevated. Her urine noted Nitrite positive with small amount of leukocytes.  Subjective: Patient continues to feel better. She wants to eat more solid food.   Assessment/Plan:  Acute encephalopathy Likely secondary to UTI. She seems to be improving and could be close to baseline. CT of the head was without acute findings but confirmed white matter disease consistent with MS. Her neurologic exam did not suggest a focal CVA. TSH is normal. U82, folic acid, ammonia, and RPR levels are all normal/negative.  Enterococcus Urinary tract infection Patient was initially on Zosyn and was transitioned to Macrodantin based on sensitivities and patient's allergies.  Transient Dysarthria Seems to have improved as well. On dysphagia 1 diet per SLP.  Chronic Diastolic Heart Failure Appears well compensated at the present time - follow daily weights and I's and O's  Hypovolemia Holding lasix and spironolactone until oral intake improves. Currently appears euvolemic  History of essential HTN Hypotensive on presentation. Blood pressures are now stable.   History of Multiple Sclerosis Patient's admission MAR does not show MS medication - is followed at [place] - per office notes she is not ambulatory at baseline - she has frequent bladder spasms and urinary incontinence - plans are currently underway to resume Tysabri - her baclofen and percocet were increased on 01/07/2015.  Abnormal LFT Resolved  DVT prophylaxis: Heparin subcutaneous Code  Status: FULL Family Communication: No family at bedside. Tried calling family with no response. Disposition Plan: Continue current management. SLP to advance diet if possible. Anticipate discharge to skilled nursing facility on Monday.   Consultants: NA  Procedures: 7/12 CT head without contrast No acute intracranial abnormalities. Mild atrophy. White matter disease compatible with history of MS 7/13 TTE - EF 60-65 % - no regional wall motion abnormalities - grade 1 diastolic dysfunction  Antibiotics: Ceftriaxone 7/11 Zosyn 7/11 > 7/15 Macrodantin 7/15 >    Objective: Blood pressure 132/76, pulse 87, temperature 98.2 F (36.8 C), temperature source Oral, resp. rate 17, height 5\' 3"  (1.6 m), weight 101.243 kg (223 lb 3.2 oz), SpO2 97 %.  Intake/Output Summary (Last 24 hours) at 01/16/15 0917 Last data filed at 01/16/15 8003  Gross per 24 hour  Intake    810 ml  Output      0 ml  Net    810 ml   Exam: General: No acute distress. Awake and alert. Lungs: Diminished air entry at the bases but clear to auscultation.  Cardiovascular: Regular rate and rhythm without murmur gallop or rub  Abdomen: Nontender, nondistended, soft, bowel sounds positive, no rebound, no ascites, no appreciable mass Extremities: No significant cyanosis, clubbing, edema bilateral lower extremities  Data Reviewed: Basic Metabolic Panel:  Recent Labs Lab 01/10/15 2244 01/11/15 0333 01/13/15 0306 01/14/15 0912  NA 140 145 140 143  K 4.5 3.9 3.3* 3.7  CL 102 106 103 110  CO2 29 31 29 26   GLUCOSE 101* 112* 92 111*  BUN 21* 17 13 12   CREATININE 0.89 0.79 1.02* 0.92  CALCIUM 9.2 9.3 8.8* 8.4*  MG  --   --   --  2.2   Liver Function Tests:  Recent Labs Lab 01/10/15 2244 01/11/15 0333 01/14/15 0912  AST 36 18 23  ALT 19 18 23   ALKPHOS 128* 121 119  BILITOT 1.3* 0.3 0.6  PROT 6.6 6.4* 5.4*  ALBUMIN 3.4* 3.1* 2.5*   CBC:  Recent Labs Lab 01/10/15 2244 01/11/15 0333 01/13/15 0306  01/14/15 0912  WBC 6.2 6.3 5.4 6.3  NEUTROABS 4.4  --   --  4.7  HGB 11.3* 10.2* 10.1* 9.7*  HCT 34.0* 31.2* 30.8* 29.2*  MCV 84.2 83.9 85.6 83.9  PLT 291 256 262 245   CBG:  Recent Labs Lab 01/11/15 0357 01/13/15 0835 01/14/15 0800  GLUCAP 102* 82 104*    Recent Results (from the past 240 hour(s))  Urine culture     Status: None   Collection Time: 01/10/15 11:11 PM  Result Value Ref Range Status   Specimen Description URINE, RANDOM  Final   Special Requests ADDED 01/11/15 0144  Final   Culture >=100,000 COLONIES/mL ENTEROCOCCUS SPECIES  Final   Report Status 01/13/2015 FINAL  Final   Organism ID, Bacteria ENTEROCOCCUS SPECIES  Final      Susceptibility   Enterococcus species - MIC*    AMPICILLIN <=2 SENSITIVE Sensitive     LEVOFLOXACIN >=8 RESISTANT Resistant     NITROFURANTOIN <=16 SENSITIVE Sensitive     VANCOMYCIN 1 SENSITIVE Sensitive     LINEZOLID 1 SENSITIVE Sensitive     * >=100,000 COLONIES/mL ENTEROCOCCUS SPECIES  Culture, blood (routine x 2)     Status: None (Preliminary result)   Collection Time: 01/11/15 12:39 AM  Result Value Ref Range Status   Specimen Description BLOOD LEFT ARM  Final   Special Requests BOTTLES DRAWN AEROBIC AND ANAEROBIC 5CC EA  Final   Culture NO GROWTH 4 DAYS  Final   Report Status PENDING  Incomplete  Culture, blood (routine x 2)     Status: None (Preliminary result)   Collection Time: 01/11/15 12:45 AM  Result Value Ref Range Status   Specimen Description BLOOD LEFT HAND  Final   Special Requests BOTTLES DRAWN AEROBIC ONLY 10CC  Final   Culture NO GROWTH 4 DAYS  Final   Report Status PENDING  Incomplete  MRSA PCR Screening     Status: None   Collection Time: 01/11/15  2:45 AM  Result Value Ref Range Status   MRSA by PCR NEGATIVE NEGATIVE Final    Comment:        The GeneXpert MRSA Assay (FDA approved for NASAL specimens only), is one component of a comprehensive MRSA colonization surveillance program. It is not intended  to diagnose MRSA infection nor to guide or monitor treatment for MRSA infections.        Scheduled Meds:  Scheduled Meds: . amantadine  100 mg Oral Daily  . baclofen  20 mg Oral TID  . carvedilol  12.5 mg Oral BID WC  . cholecalciferol  1,000 Units Oral Daily  . darifenacin  7.5 mg Oral Daily  . docusate sodium  100 mg Oral BID  . DULoxetine  60 mg Oral Daily  . feeding supplement (PRO-STAT SUGAR FREE 64)  30 mL Oral BID  . gabapentin  900 mg Oral TID  . heparin  5,000 Units Subcutaneous 3 times per day  . imipramine  75 mg Oral QPM  . latanoprost  1 drop Both Eyes QHS  . multivitamin with minerals  1 tablet Oral Daily  . nitrofurantoin (macrocrystal-monohydrate)  100 mg Oral  Q12H  . polyethylene glycol  17 g Oral BID  . sodium chloride  3 mL Intravenous Q12H      Bonnielee Haff [phone]  [facility] For Consults/Admissions - Flow Manager - [phone] Office  [phone]  Contact MD directly via text page:      [email or web address]      password [facility]  01/16/2015, 9:17 AM   LOS: 5 days

## 2015-01-16 NOTE — Discharge Instructions (Signed)

## 2015-01-17 MED ORDER — OXYCODONE-ACETAMINOPHEN 7.5-325 MG PO TABS: 1.0000 | ORAL_TABLET | Freq: Four times a day (QID) | ORAL | Status: DC | PRN

## 2015-01-17 NOTE — Care Management Important Message (Signed)
Important Message  Patient Details  Name: Shannon Garrett MRN: [id number] Date of Birth: 1960/08/22   Medicare Important Message Given:  Yes-third notification given    Delorse Lek 01/17/2015, 2:02 PM

## 2015-01-17 NOTE — Progress Notes (Addendum)
Patient discharged to [facility] via as ordered, transported via [place]; report was given to nurse Lynn Ito.

## 2015-01-17 NOTE — Discharge Summary (Signed)
[facility]  Physician Discharge Summary   Patient ID: Shannon Garrett MRN: [id number] DOB/AGE: [birth year] 54 y.o.  Admit date: 01/10/2015 Discharge date: 01/17/2015  PCP: Gildardo Cranker, DO  DISCHARGE DIAGNOSES:  Principal Problem:   SIRS (systemic inflammatory response syndrome) Active Problems:   HYPERTENSION, BENIGN ESSENTIAL   Chronic diastolic heart failure   DS (disseminated sclerosis)   Altered mental status   UTI (lower urinary tract infection)   Hypovolemia   Altered mental state   UTI (urinary tract infection), uncomplicated   Cardiomyopathy   Pressure ulcer   Urinary tract infection due to Enterococcus   Chronic diastolic CHF (congestive heart failure)   Delirium   Essential hypertension   Abnormal LFTs   Foot drop, bilateral   RECOMMENDATIONS FOR OUTPATIENT FOLLOW UP: Avoid narcotics as much as possible.Have speech therapy evaluate patient to see if diet can be advanced.   DISCHARGE CONDITION: fair  Diet recommendation: Dysphagia 1 diet with thin liquids. Can be advanced once seen by speech therapist  Filed Weights   01/15/15 0616 01/16/15 0626 01/17/15 0622  Weight: 99.29 kg (218 lb 14.3 oz) 101.243 kg (223 lb 3.2 oz) 104.6 kg (230 lb 9.6 oz)    INITIAL HISTORY: 54 y.o. F w/ a Hx of Depression, Multiple Sclerosis, Cardiomyopathy (04/29/12 EF 20-25%), and HTN, who resides at [facility] who presented with altered mental status. By history patient is normally able to carry on a conversation. She had received percocet and baclofen. On arrival to the ED she was noted to be hypotensive. Her BUN was slightly elevated. Her urine noted Nitrite positive with small amount of leukocytes.   Procedures: 7/13 TTE - EF 60-65 % - no regional wall motion abnormalities - grade 1 diastolic dysfunction  HOSPITAL COURSE:   Acute encephalopathy This was likely secondary to UTI. She seems to be improving and is close to baseline. CT of the head was without acute  findings but confirmed white matter disease consistent with MS. Her neurologic exam did not suggest a focal CVA. TSH is normal. K91, folic acid, ammonia, and RPR levels are all normal/negative.  Enterococcus Urinary tract infection Patient was initially on Zosyn and was transitioned to Macrodantin based on sensitivities and patient's allergies.  Transient Dysarthria Seems to have improved as well. On dysphagia 1 diet per SLP. Can be advanced once seen by speech therapist at the skilled nursing facility.  Chronic Diastolic Heart Failure Appears well compensated at the present time. Okay to resume her diuretics.  Hypovolemia Initially, Lasix, and Spironolactone was held. She was gently hydrated.   History of essential HTN Patient was hypotensive on presentation. Blood pressures are now stable.   History of Multiple Sclerosis Patient is followed at [facility]. Per office notes she is not ambulatory at baseline. She has frequent bladder spasms and urinary incontinence. Plans are currently underway to resume Tysabri. Her baclofen and percocet were increased on 01/07/2015. Avoid narcotics as much as possible.  Abnormal LFT Resolved  Overall, stable and improved. Back to baseline from a mental standpoint. Complete course of antibiotics. Okay for discharge back to her SNF.  PERTINENT LABS:  The results of significant diagnostics from this hospitalization (including imaging, microbiology, ancillary and laboratory) are listed below for reference.    Microbiology: Recent Results (from the past 240 hour(s))  Urine culture     Status: None   Collection Time: 01/10/15 11:11 PM  Result Value Ref Range Status   Specimen Description URINE, RANDOM  Final   Special  Requests ADDED 01/11/15 0144  Final   Culture >=100,000 COLONIES/mL ENTEROCOCCUS SPECIES  Final   Report Status 01/13/2015 FINAL  Final   Organism ID, Bacteria ENTEROCOCCUS SPECIES  Final      Susceptibility    Enterococcus species - MIC*    AMPICILLIN <=2 SENSITIVE Sensitive     LEVOFLOXACIN >=8 RESISTANT Resistant     NITROFURANTOIN <=16 SENSITIVE Sensitive     VANCOMYCIN 1 SENSITIVE Sensitive     LINEZOLID 1 SENSITIVE Sensitive     * >=100,000 COLONIES/mL ENTEROCOCCUS SPECIES  Culture, blood (routine x 2)     Status: None   Collection Time: 01/11/15 12:39 AM  Result Value Ref Range Status   Specimen Description BLOOD LEFT ARM  Final   Special Requests BOTTLES DRAWN AEROBIC AND ANAEROBIC 5CC EA  Final   Culture NO GROWTH 5 DAYS  Final   Report Status 01/16/2015 FINAL  Final  Culture, blood (routine x 2)     Status: None   Collection Time: 01/11/15 12:45 AM  Result Value Ref Range Status   Specimen Description BLOOD LEFT HAND  Final   Special Requests BOTTLES DRAWN AEROBIC ONLY 10CC  Final   Culture NO GROWTH 5 DAYS  Final   Report Status 01/16/2015 FINAL  Final  MRSA PCR Screening     Status: None   Collection Time: 01/11/15  2:45 AM  Result Value Ref Range Status   MRSA by PCR NEGATIVE NEGATIVE Final    Comment:        The GeneXpert MRSA Assay (FDA approved for NASAL specimens only), is one component of a comprehensive MRSA colonization surveillance program. It is not intended to diagnose MRSA infection nor to guide or monitor treatment for MRSA infections.      Labs: Basic Metabolic Panel:  Recent Labs Lab 01/10/15 2244 01/11/15 0333 01/13/15 0306 01/14/15 0912  NA 140 145 140 143  K 4.5 3.9 3.3* 3.7  CL 102 106 103 110  CO2 29 31 29 26   GLUCOSE 101* 112* 92 111*  BUN 21* 17 13 12   CREATININE 0.89 0.79 1.02* 0.92  CALCIUM 9.2 9.3 8.8* 8.4*  MG  --   --   --  2.2   Liver Function Tests:  Recent Labs Lab 01/10/15 2244 01/11/15 0333 01/14/15 0912  AST 36 18 23  ALT 19 18 23   ALKPHOS 128* 121 119  BILITOT 1.3* 0.3 0.6  PROT 6.6 6.4* 5.4*  ALBUMIN 3.4* 3.1* 2.5*    Recent Labs Lab 01/12/15 2111  AMMONIA 30   CBC:  Recent Labs Lab 01/10/15 2244  01/11/15 0333 01/13/15 0306 01/14/15 0912  WBC 6.2 6.3 5.4 6.3  NEUTROABS 4.4  --   --  4.7  HGB 11.3* 10.2* 10.1* 9.7*  HCT 34.0* 31.2* 30.8* 29.2*  MCV 84.2 83.9 85.6 83.9  PLT 291 256 262 245    CBG:  Recent Labs Lab 01/11/15 0357 01/13/15 0835 01/14/15 0800  GLUCAP 102* 82 104*     IMAGING STUDIES Ct Head Wo Contrast  01/11/2015   CLINICAL DATA:  Altered mental status.  EXAM: CT HEAD WITHOUT CONTRAST  TECHNIQUE: Contiguous axial images were obtained from the base of the skull through the vertex without intravenous contrast.  COMPARISON:  11/03/2013  FINDINGS: Technically limited study due to motion artifact. Diffuse cerebral atrophy. Mild ventricular dilatation. Patchy low-attenuation changes in the deep white matter consistent with known history of multiple sclerosis. No mass effect or midline shift. No abnormal extra-axial fluid  collections. Gray-white matter junctions are distinct. Basal cisterns are not effaced. No evidence of acute intracranial hemorrhage. No depressed skull fractures. Visualized paranasal sinuses and mastoid air cells are not opacified.  IMPRESSION: No acute intracranial abnormalities. Mild atrophy. White matter disease compatible with history of MS.   Electronically Signed   By: Lucienne Capers M.D.   On: 01/11/2015 00:06   Dg Chest Portable 1 View  01/10/2015   CLINICAL DATA:  54 year old female with shortness of breath  EXAM: PORTABLE CHEST - 1 VIEW  COMPARISON:  Chest radiograph dated 05/23/2014  FINDINGS: The heart size and mediastinal contours are within normal limits. Both lungs are clear. The visualized skeletal structures are unremarkable.  IMPRESSION: No active disease.   Electronically Signed   By: Anner Crete M.D.   On: 01/10/2015 22:33    DISCHARGE EXAMINATION: Filed Vitals:   01/16/15 0626 01/16/15 1340 01/16/15 2114 01/17/15 0622  BP: 132/76 122/72 118/49 108/69  Pulse: 87 87 83 82  Temp: 98.2 F (36.8 C) 98.4 F (36.9 C) 98.2 F  (36.8 C) 98.4 F (36.9 C)  TempSrc: Oral Oral Oral Oral  Resp: 17 18 18 18   Height:      Weight: 101.243 kg (223 lb 3.2 oz)   104.6 kg (230 lb 9.6 oz)  SpO2: 97% 98% 99% 98%   General appearance: alert, appears stated age, distracted and no distress Resp: clear to auscultation bilaterally Cardio: regular rate and rhythm, S1, S2 normal, no murmur, click, rub or gallop GI: soft, non-tender; bowel sounds normal; no masses,  no organomegaly  DISPOSITION: SNF  Discharge Instructions    Call MD for:  difficulty breathing, headache or visual disturbances    Complete by:  As directed      Call MD for:  extreme fatigue    Complete by:  As directed      Call MD for:  persistant dizziness or light-headedness    Complete by:  As directed      Call MD for:  persistant nausea and vomiting    Complete by:  As directed      Call MD for:  severe uncontrolled pain    Complete by:  As directed      Call MD for:  temperature >100.4    Complete by:  As directed      Discharge instructions    Complete by:  As directed   Avoid narcotics as much as possible.Have speech therapy evaluate patient to see if diet can be advanced.  You were cared for by a hospitalist during your hospital stay. If you have any questions about your discharge medications or the care you received while you were in the hospital after you are discharged, you can call the unit and asked to speak with the hospitalist on call if the hospitalist that took care of you is not available. Once you are discharged, your primary care physician will handle any further medical issues. Please note that NO REFILLS for any discharge medications will be authorized once you are discharged, as it is imperative that you return to your primary care physician (or establish a relationship with a primary care physician if you do not have one) for your aftercare needs so that they can reassess your need for medications and monitor your lab values. If you do not  have a primary care physician, you can call [phone] for a physician referral.     Increase activity slowly    Complete by:  As directed  ALLERGIES:  Allergies  Allergen Reactions  . Sulfonamide Derivatives Hives and Shortness Of Breath  . Penicillins Hives and Itching     Current Discharge Medication List    START taking these medications   Details  nitrofurantoin, macrocrystal-monohydrate, (MACROBID) 100 MG capsule Take 1 capsule (100 mg total) by mouth every 12 (twelve) hours. For 5 more days.      CONTINUE these medications which have CHANGED   Details  carvedilol (COREG) 12.5 MG tablet Take 1 tablet (12.5 mg total) by mouth 2 (two) times daily with a meal.    furosemide (LASIX) 40 MG tablet Take 1 tablet (40 mg total) by mouth daily. Take 160 mg every morning and 80 mg po q HS    oxyCODONE-acetaminophen (PERCOCET) 7.5-325 MG per tablet Take 1 tablet by mouth every 6 (six) hours as needed for severe pain. Qty: 30 tablet, Refills: 0    potassium chloride (KLOR-CON) 20 MEQ packet Take 20 mEq by mouth daily.      CONTINUE these medications which have NOT CHANGED   Details  amantadine (SYMMETREL) 100 MG capsule Take 100 mg by mouth daily.     Amino Acids-Protein Hydrolys (FEEDING SUPPLEMENT, PRO-STAT SUGAR FREE 64,) LIQD Take 30 mLs by mouth 2 (two) times daily.    baclofen (LIORESAL) 10 MG tablet Take 1 tablet (10 mg total) by mouth 3 (three) times daily. Qty: 90 each, Refills: 5    Cholecalciferol (VITAMIN D-3 PO) Take 1 tablet by mouth daily.    Cyanocobalamin (VITAMIN B-12 PO) Take 1 tablet by mouth daily.    docusate sodium (COLACE) 100 MG capsule Take 100 mg by mouth 2 (two) times daily.    DULoxetine (CYMBALTA) 60 MG capsule Take 60 mg by mouth daily.    gabapentin (NEURONTIN) 300 MG capsule Take 3 capsules (900 mg total) by mouth 3 (three) times daily. Qty: 270 capsule, Refills: 5    imipramine (TOFRANIL) 50 MG tablet Take 1.5 tablets (75 mg  total) by mouth every evening. For depressive disorder Qty: 90 tablet, Refills: 11   Associated Diagnoses: Chronic pain    latanoprost (XALATAN) 0.005 % ophthalmic solution Place 1 drop into both eyes at bedtime.    magnesium hydroxide (MILK OF MAGNESIA) 400 MG/5ML suspension Take 30 mLs by mouth every 8 (eight) hours as needed for mild constipation or moderate constipation.    Multiple Vitamin (MULTIVITAMIN WITH MINERALS) TABS tablet Take 1 tablet by mouth daily.    phenazopyridine (PYRIDIUM) 100 MG tablet Take 100 mg by mouth 3 (three) times daily.     polyethylene glycol (MIRALAX / GLYCOLAX) packet Take 17 g by mouth 2 (two) times daily. Qty: 120 packet, Refills: 11   Associated Diagnoses: Chronic constipation    solifenacin (VESICARE) 10 MG tablet Take 10 mg by mouth daily. For bladder spasms    spironolactone (ALDACTONE) 25 MG tablet Take 25 mg by mouth daily. For CHF    zolpidem (AMBIEN) 10 MG tablet Take one tablet by mouth at bedtime for rest Qty: 30 tablet, Refills: 5    natalizumab (TYSABRI) 300 MG/15ML injection Inject 300 mg into the vein every 30 (thirty) days.      STOP taking these medications     oxyCODONE-acetaminophen (PERCOCET) 10-325 MG per tablet        Follow-up Information    Follow up with Gildardo Cranker, DO. Schedule an appointment as soon as possible for a visit in 1 week.   Specialty:  Internal Medicine   Why:  post hospitalization follow up   Contact information:   [place] [address] [phone]       TOTAL DISCHARGE TIME: 35 minutes  [facility] Pager [phone]  01/17/2015, 12:07 PM

## 2015-01-17 NOTE — Clinical Social Work Note (Signed)
Patient to be d/c'ed today to [facility].  Patient and family agreeable to plans will transport via ems RN to call report.  CSW notified patient's sister Janella Rogala and left message on her phone that patient will be returning to [facility] today.   Evette Cristal, MSW, [place]

## 2015-01-18 ENCOUNTER — Non-Acute Institutional Stay (SKILLED_NURSING_FACILITY): Payer: Medicare Other | Admitting: Adult Health

## 2015-01-18 ENCOUNTER — Encounter: Payer: Self-pay | Admitting: *Deleted

## 2015-01-18 ENCOUNTER — Other Ambulatory Visit: Payer: Self-pay | Admitting: *Deleted

## 2015-01-18 DIAGNOSIS — N390 Urinary tract infection, site not specified: Secondary | ICD-10-CM | POA: Diagnosis not present

## 2015-01-18 DIAGNOSIS — G35 Multiple sclerosis: Secondary | ICD-10-CM | POA: Diagnosis not present

## 2015-01-18 DIAGNOSIS — F329 Major depressive disorder, single episode, unspecified: Secondary | ICD-10-CM | POA: Diagnosis not present

## 2015-01-18 DIAGNOSIS — B952 Enterococcus as the cause of diseases classified elsewhere: Secondary | ICD-10-CM

## 2015-01-18 DIAGNOSIS — G8929 Other chronic pain: Secondary | ICD-10-CM | POA: Diagnosis not present

## 2015-01-18 DIAGNOSIS — G8220 Paraplegia, unspecified: Secondary | ICD-10-CM

## 2015-01-18 DIAGNOSIS — I5032 Chronic diastolic (congestive) heart failure: Secondary | ICD-10-CM

## 2015-01-18 DIAGNOSIS — K5900 Constipation, unspecified: Secondary | ICD-10-CM | POA: Diagnosis not present

## 2015-01-18 DIAGNOSIS — I119 Hypertensive heart disease without heart failure: Secondary | ICD-10-CM | POA: Diagnosis not present

## 2015-01-18 DIAGNOSIS — K5909 Other constipation: Secondary | ICD-10-CM

## 2015-01-18 DIAGNOSIS — F32A Depression, unspecified: Secondary | ICD-10-CM

## 2015-01-18 MED ORDER — ZOLPIDEM TARTRATE 10 MG PO TABS: ORAL_TABLET | ORAL | Status: DC

## 2015-01-18 MED ORDER — OXYCODONE-ACETAMINOPHEN 7.5-325 MG PO TABS
ORAL_TABLET | ORAL | Status: DC
Start: 2015-01-18 — End: 2015-06-04

## 2015-01-18 NOTE — Telephone Encounter (Signed)
[facility]

## 2015-01-18 NOTE — Progress Notes (Signed)
Patient ID: Shannon Garrett, female   DOB: 01/03/1961, 54 y.o.   MRN: [id number]   Facility: [facility]      Allergies  Allergen Reactions  . Sulfonamide Derivatives Hives and Shortness Of Breath  . Penicillins Hives and Itching    Chief Complaint  Patient presents with  . Hospitalization Follow-up    HPI:  She is a long term resident of this facility who was hospitalized for acute encephalopathy due to uti; sirs; and transient dysarthria. She states that she is feeling "ok". She is happy to be back at the facility. The hospital did note that the use of narcotics should be avoided. There are no nursing concerns at this time.    Past Medical History  Diagnosis Date  . MS (multiple sclerosis)     a. Dx'd late 20's. b. Tx with Novantrone, Tysabri, Copaxone previously.  Marland Kitchen Hypertension   . Depression   . Glaucoma   . Cardiomyopathy     a.  Echo 04/29/12: Mild LVH, EF 20-25%, mild AI, moderate MR, moderate LAE, mild RAE, mild RVE, moderate TR, PASP 51, small pericardial effusion;   b. probably non-ischemic given multiple chemo-Tx agents used for MS and global LV dysfn on echo  . Chronic systolic heart failure   . CHF (congestive heart failure)   . Shortness of breath   . Neuromuscular disorder     MS    Past Surgical History  Procedure Laterality Date  . Ablation      uterine  . Cesarean section      VITAL SIGNS BP 134/60 mmHg  Pulse 78  Ht $R'5\' 5"'ef$  (1.651 m)  Wt 215 lb (97.523 kg)  BMI 35.78 kg/m2  SpO2 97%  Patient's Medications  New Prescriptions   No medications on file  Previous Medications   AMANTADINE (SYMMETREL) 100 MG CAPSULE    Take 100 mg by mouth daily.    AMINO ACIDS-PROTEIN HYDROLYS (FEEDING SUPPLEMENT, PRO-STAT SUGAR FREE 64,) LIQD    Take 30 mLs by mouth 2 (two) times daily.   BACLOFEN (LIORESAL) 10 MG TABLET    Take 1 tablet (10 mg total) by mouth 3 (three) times daily.   CARVEDILOL (COREG) 12.5 MG TABLET    Take 1 tablet (12.5 mg total)  by mouth 2 (two) times daily with a meal.   CHOLECALCIFEROL (VITAMIN D-3 PO)    Take 1 tablet by mouth daily.   CYANOCOBALAMIN (VITAMIN B-12 PO)    Take 1 tablet by mouth daily.   DOCUSATE SODIUM (COLACE) 100 MG CAPSULE    Take 100 mg by mouth 2 (two) times daily.   DULOXETINE (CYMBALTA) 60 MG CAPSULE    Take 60 mg by mouth daily.   FUROSEMIDE (LASIX) 40 MG TABLET    Take 1 tablet (40 mg total) by mouth daily. Take 160 mg every morning and 80 mg po q HS   GABAPENTIN (NEURONTIN) 300 MG CAPSULE    Take 3 capsules (900 mg total) by mouth 3 (three) times daily.   IMIPRAMINE (TOFRANIL) 50 MG TABLET    Take 1.5 tablets (75 mg total) by mouth every evening. For depressive disorder   LATANOPROST (XALATAN) 0.005 % OPHTHALMIC SOLUTION    Place 1 drop into both eyes at bedtime.   MAGNESIUM HYDROXIDE (MILK OF MAGNESIA) 400 MG/5ML SUSPENSION    Take 30 mLs by mouth every 8 (eight) hours as needed for mild constipation or moderate constipation.   MULTIPLE VITAMIN (MULTIVITAMIN WITH MINERALS) TABS TABLET  Take 1 tablet by mouth daily.   NATALIZUMAB (TYSABRI) 300 MG/15ML INJECTION    Inject 300 mg into the vein every 30 (thirty) days.   NITROFURANTOIN, MACROCRYSTAL-MONOHYDRATE, (MACROBID) 100 MG CAPSULE    Take 1 capsule (100 mg total) by mouth every 12 (twelve) hours. For 5 more days.   OXYCODONE-ACETAMINOPHEN (PERCOCET) 7.5-325 MG PER TABLET    Take 1 tablet by mouth every 6 (six) hours as needed for severe pain.   PHENAZOPYRIDINE (PYRIDIUM) 100 MG TABLET    Take 100 mg by mouth 3 (three) times daily.    POLYETHYLENE GLYCOL (MIRALAX / GLYCOLAX) PACKET    Take 17 g by mouth 2 (two) times daily.   POTASSIUM CHLORIDE (KLOR-CON) 20 MEQ PACKET    Take 20 mEq by mouth daily.   SOLIFENACIN (VESICARE) 10 MG TABLET    Take 10 mg by mouth daily. For bladder spasms   SPIRONOLACTONE (ALDACTONE) 25 MG TABLET    Take 25 mg by mouth daily. For CHF   ZOLPIDEM (AMBIEN) 10 MG TABLET    Take one tablet by mouth at bedtime for  rest  Modified Medications   No medications on file  Discontinued Medications   No medications on file     SIGNIFICANT DIAGNOSTIC EXAMS  10-08-14: MRI brain: No significant change in diffuse white matter lesions consistent with patient's history of multiple sclerosis. None of these areas demonstrate abnormal restriction or enhancement as may be seen with areas of acute demyelination. Global atrophy. 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 Right parotid 1 cm lesion does not have typical appearance of an intra parotid lymph node and therefore primary parotid lesion is a possibility. ENT consultation may be considered for further delineation.  10-08-14: mri of cervical; spine: Scattered new areas of altered signal intensity (compared to 2007) throughout the cervical cord as detailed above consistent with patient's history of multiple sclerosis. None of these areas demonstrate enhancement as may be seen with areas of active demyelination. Cervical spondylotic changes as detailed above most prominent C5-6 level.  12-13-14: ABI: right: mild atherosclerotic disease   01-10-15: ct of head: No acute intracranial abnormalities. Mild atrophy. White matter disease compatible with history of MS.  01-10-15: chest x-ray: No active disease  01-12-15: 2-d echo: Left ventricle: The cavity size was normal. There was mild concentric hypertrophy. Systolic function was normal. The estimated ejection fraction was in the range of 60% to 65%. Wall motion was normal; there were no regional wall motion abnormalities. Doppler parameters are consistent with abnormal left ventricular relaxation (grade 1 diastolic dysfunction). - Aortic valve: There was mild regurgitation. - Mitral valve: Calcified annulus. There was mild regurgitation.   LABS REVIEWED:   07-16-14: wbc 5.3; hgb 11.6; hct 34.0; mcv 80.8 ;plt 273; glucose 86; bun 13; creat 1.01; k+3.9;  na++140 08-14-14: tsh 2.03  09-28-14: wbc 5.7 ;hgb 10.0; hct 31.6; mcv 84.5; plt 323; glucose 87; bun 16.4; creat 1.00; k+3.5; na++142; liver normal albumin 3.7; vit d 33.41 10-05-14: wbc 5.0; hgb 10.8; hct 32.2; mcv 82.8; plt 309; bun 94; bun 24; creat 1.02; k+3.3; na++139; liver normal albumin 3.7; urine culture: enterococcus  12-16-14: wbc 5.9; hgb 10.8; hct 35.6; mcv 87.4; plt 273; glucose 111; bun 39.1; creat 1.30; k+3.9; na++139; liver normal albumin 3.9  01-10-15: wbc 6.2; hgb 11.3; hct 34.0; mcv 84.2; plt 291; glucose 101; bun 21; creat 0.89; k+4.5; na++140; alk phos 138;  albumin 3.4; ;urine culture: enterococcus 01-11-15: wbc 6.3; hgb 10.2; hct 31.2; mcv 83.9; ;plt 256; glucose 112; bun 17; creat 0.79; k+3.9; na++145; liver normal albumin 3.1; hgb a1c 5.3; tsh 1.209 01-12-15: HIV: nr; RPR: nr; ammonia 30; iron 50; tibc 253; folate 24.2; vit b 12: 2487     Review of Systems  Constitutional: Positive for fatigue. Negative for appetite change.  HENT: Negative for congestion.   Respiratory: Negative for cough, chest tightness and shortness of breath.   Cardiovascular: Negative for chest pain, palpitations and leg swelling.  Gastrointestinal: Negative for nausea, abdominal pain, diarrhea and constipation.  Musculoskeletal: Negative for myalgias and arthralgias.  Skin: Negative.   Neurological: Negative for dizziness.  Psychiatric/Behavioral: The patient is not nervous/anxious.       Physical Exam Constitutional: She is oriented to person, place, and time. No distress.  Overweight   Neck: Neck supple. No JVD present. No thyromegaly present.  Cardiovascular: Normal rate, regular rhythm and intact distal pulses.   Respiratory: Effort normal and breath sounds normal. No respiratory distress.  GI: Soft. Bowel sounds are normal. She exhibits no distension. There is no tenderness.  Musculoskeletal: She exhibits no edema.  Has limited range of motion in all extremities Unable to stand   Neurological: She is alert .  Skin: Skin is warm and dry. She is not diaphoretic.     ASSESSMENT/ PLAN:  1. MS: no change in her status.  Is followed by Dr. Felecia Shelling in neurology;  Has paraplegia.will continue amantadine 100 mg daily baclofen 10 mg three times daily for muscle spasms; is currently not taking Tysabri facility will follow up with neurology to set up injections    2. Hypertension: will continue coreg 25 mg twice daily and will monitor   3. Chronic combined systolic and diastolic heart failure: is stable will continue lasix 160 mg in the am and 80 mg in the pm; with k+ 40 meq daily; will continue aldactone 25 mg daily; her EF is 60-65%.   4. Neurogenic bladder: takes vesicare 10 mg daily and take pyridium 100 mg three times daily   5. Depression: will continue cymbalta 60 mg daily and ambien 10 mg nightly   6. Chronic pain: she is presently stable   will continue cymbalta 60 mg daily; neurontin 900 mg three times daily; percocet 7.5/325 mg every 6 hours as needed and  tofranil  75 mg nightly   9. Constipation: will continue  miralax  twice  daily   10. UTI: will complete macrobid and will monitor her status.    Time spent with patient  50  minutes >50% time spent counseling; reviewing medical record; tests; labs; and developing future plan of care    Ok Edwards NP [facility]  Contact [phone] Monday through Friday 8am- 5pm  After hours call [phone]

## 2015-01-20 ENCOUNTER — Ambulatory Visit: Payer: Self-pay | Admitting: Neurology

## 2015-02-07 ENCOUNTER — Ambulatory Visit: Payer: Self-pay | Admitting: Neurology

## 2015-02-08 ENCOUNTER — Encounter: Payer: Self-pay | Admitting: *Deleted

## 2015-02-09 ENCOUNTER — Encounter: Payer: Self-pay | Admitting: *Deleted

## 2015-02-09 NOTE — Progress Notes (Signed)
I received a fax from [place] today stating they have not been able to contact Jeanean to complete her Touch enrollment and get her started on Tysabri.  I have spoken with Deon at [facility] and advised that Yanett should call any support services coordinator at [phone].  She sts. she will have Audyn call asap/fim

## 2015-02-24 ENCOUNTER — Non-Acute Institutional Stay (SKILLED_NURSING_FACILITY): Payer: Medicare Other | Admitting: Internal Medicine

## 2015-02-24 DIAGNOSIS — K5900 Constipation, unspecified: Secondary | ICD-10-CM

## 2015-02-24 DIAGNOSIS — G8220 Paraplegia, unspecified: Secondary | ICD-10-CM | POA: Diagnosis not present

## 2015-02-24 DIAGNOSIS — G4700 Insomnia, unspecified: Secondary | ICD-10-CM

## 2015-02-24 DIAGNOSIS — G894 Chronic pain syndrome: Secondary | ICD-10-CM | POA: Diagnosis not present

## 2015-02-24 DIAGNOSIS — I5042 Chronic combined systolic (congestive) and diastolic (congestive) heart failure: Secondary | ICD-10-CM

## 2015-02-24 DIAGNOSIS — F32A Depression, unspecified: Secondary | ICD-10-CM

## 2015-02-24 DIAGNOSIS — F329 Major depressive disorder, single episode, unspecified: Secondary | ICD-10-CM

## 2015-02-24 DIAGNOSIS — G35D Multiple sclerosis, unspecified: Secondary | ICD-10-CM

## 2015-02-24 DIAGNOSIS — I10 Essential (primary) hypertension: Secondary | ICD-10-CM | POA: Diagnosis not present

## 2015-02-24 DIAGNOSIS — G35 Multiple sclerosis: Secondary | ICD-10-CM

## 2015-02-24 DIAGNOSIS — L409 Psoriasis, unspecified: Secondary | ICD-10-CM

## 2015-02-24 DIAGNOSIS — N319 Neuromuscular dysfunction of bladder, unspecified: Secondary | ICD-10-CM | POA: Diagnosis not present

## 2015-02-24 DIAGNOSIS — K5909 Other constipation: Secondary | ICD-10-CM

## 2015-03-02 ENCOUNTER — Encounter: Payer: Self-pay | Admitting: *Deleted

## 2015-03-03 ENCOUNTER — Encounter: Payer: Self-pay | Admitting: Internal Medicine

## 2015-03-03 DIAGNOSIS — G894 Chronic pain syndrome: Secondary | ICD-10-CM | POA: Insufficient documentation

## 2015-03-03 NOTE — Progress Notes (Addendum)
Patient ID: Shannon Garrett, female   DOB: [birth year], 54 y.o.   MRN: [id number]    DATE: 02/24/15  Location:  [facility]    Place of Service: SNF [phone])   Extended Emergency Contact Information Primary Emergency Contact: [place], [place] [address] [place] Phone: [phone] Relation: Sister Secondary Emergency Contact: Jefm Bryant, [place] Phone: [phone] Mobile Phone: [phone] Relation: Friend  Advanced Directive information  DNR  Chief Complaint  Patient presents with  . Medical Management of Chronic Issues    HPI:  54 yo female long term resident seen today for f/u. She c/o difficulty falling asleep. She takes ambien 51m qhs. She also c/o dry, flaky scalp. No relief with fluocinolone shampoo. She would like to try derma-smoothe which was recommended by the facility hairstylist. She has a hx psoriasis. No other concerns. No nursing issues  She was in the hospital in July 2016 with acute change in MS due to UTI.  MS - has difficulty performing ADLs without assistance. She resumed tysabri infusions and takes amantidine. She cannot raise her arms above her head without great pain and effort. Her appetite is okay but she does not get a lot of exercise due to weakness in extremities. She is currently receiving OT and ST. She struggles with urinary incontinence due to neurogenic bladder. Takes vesicare. She is mx by neurology  She has muscle spasms and chronic pain and takes percocet, gabapentin and baclofen  HTN is stable on coreg and furosemide.  She has depression but not well controlled on cymbalta and imipramine. No SI/HI at this time  Heart failure controlled on lasix, coreg and spironolactone. No new SOB or CP.  She takes vitamin/minaeral supplements. She follows an 1800cc fluid restriction  Past Medical History  Diagnosis Date  . MS (multiple sclerosis)    a. Dx'd late 20's. b. Tx with Novantrone, Tysabri, Copaxone previously.  .Marland KitchenHypertension   . Depression   . Glaucoma   . Cardiomyopathy     a.  Echo 04/29/12: Mild LVH, EF 20-25%, mild AI, moderate MR, moderate LAE, mild RAE, mild RVE, moderate TR, PASP 51, small pericardial effusion;   b. probably non-ischemic given multiple chemo-Tx agents used for MS and global LV dysfn on echo  . Chronic systolic heart failure   . CHF (congestive heart failure)   . Shortness of breath   . Neuromuscular disorder     MS    Past Surgical History  Procedure Laterality Date  . Ablation      uterine  . Cesarean section      Patient Care Team: MGildardo Cranker DO as PCP - General (Internal Medicine) DGerlene Fee NP as Nurse Practitioner (Nurse Practitioner)  Social History   Social History  . Marital Status: Single    Spouse Name: N/A  . Number of Children: N/A  . Years of Education: N/A   Occupational History  . Not on file.   Social History Main Topics  . Smoking status: Former SResearch scientist (life sciences) . Smokeless tobacco: Never Used  . Alcohol Use: No  . Drug Use: No  . Sexual Activity: Not Currently   Other Topics Concern  . Not on file   Social History Narrative     reports that she has quit smoking. She has never used smokeless tobacco. She reports that she does  not drink alcohol or use illicit drugs.  Immunization History  Administered Date(s) Administered  . Influenza,inj,Quad PF,36+ Mos 08/01/2013  . Pneumococcal Polysaccharide-23 08/01/2013  . Pneumococcal-Unspecified 07/29/2014    Allergies  Allergen Reactions  . Sulfonamide Derivatives Hives and Shortness Of Breath  . Penicillins Hives and Itching    Medications: Patient's Medications  New Prescriptions   No medications on file  Previous Medications   AMANTADINE (SYMMETREL) 100 MG CAPSULE    Take 100 mg by mouth daily.    AMINO ACIDS-PROTEIN HYDROLYS (FEEDING SUPPLEMENT, PRO-STAT SUGAR FREE 64,) LIQD    Take 30 mLs by  mouth 2 (two) times daily.   BACLOFEN (LIORESAL) 10 MG TABLET    Take 1 tablet (10 mg total) by mouth 3 (three) times daily.   CARVEDILOL (COREG) 12.5 MG TABLET    Take 1 tablet (12.5 mg total) by mouth 2 (two) times daily with a meal.   CHOLECALCIFEROL (VITAMIN D-3 PO)    Take 1 tablet by mouth daily.   CYANOCOBALAMIN (VITAMIN B-12 PO)    Take 1 tablet by mouth daily.   DOCUSATE SODIUM (COLACE) 100 MG CAPSULE    Take 100 mg by mouth 2 (two) times daily.   DULOXETINE (CYMBALTA) 60 MG CAPSULE    Take 60 mg by mouth daily.   FUROSEMIDE (LASIX) 40 MG TABLET    Take 1 tablet (40 mg total) by mouth daily. Take 160 mg every morning and 80 mg po q HS   GABAPENTIN (NEURONTIN) 300 MG CAPSULE    Take 3 capsules (900 mg total) by mouth 3 (three) times daily.   IMIPRAMINE (TOFRANIL) 50 MG TABLET    Take 1.5 tablets (75 mg total) by mouth every evening. For depressive disorder   LATANOPROST (XALATAN) 0.005 % OPHTHALMIC SOLUTION    Place 1 drop into both eyes at bedtime.   MAGNESIUM HYDROXIDE (MILK OF MAGNESIA) 400 MG/5ML SUSPENSION    Take 30 mLs by mouth every 8 (eight) hours as needed for mild constipation or moderate constipation.   MULTIPLE VITAMIN (MULTIVITAMIN WITH MINERALS) TABS TABLET    Take 1 tablet by mouth daily.   NATALIZUMAB (TYSABRI) 300 MG/15ML INJECTION    Inject 300 mg into the vein every 30 (thirty) days.   NITROFURANTOIN, MACROCRYSTAL-MONOHYDRATE, (MACROBID) 100 MG CAPSULE    Take 1 capsule (100 mg total) by mouth every 12 (twelve) hours. For 5 more days.   OXYCODONE-ACETAMINOPHEN (PERCOCET) 7.5-325 MG PER TABLET    Take one tablet by mouth every 6 hours as needed for pain. Not to exceed 3gm APAP from all sources/24hr   PHENAZOPYRIDINE (PYRIDIUM) 100 MG TABLET    Take 100 mg by mouth 3 (three) times daily.    POLYETHYLENE GLYCOL (MIRALAX / GLYCOLAX) PACKET    Take 17 g by mouth 2 (two) times daily.   POTASSIUM CHLORIDE (KLOR-CON) 20 MEQ PACKET    Take 20 mEq by mouth daily.   SOLIFENACIN  (VESICARE) 10 MG TABLET    Take 10 mg by mouth daily. For bladder spasms   SPIRONOLACTONE (ALDACTONE) 25 MG TABLET    Take 25 mg by mouth daily. For CHF   ZOLPIDEM (AMBIEN) 10 MG TABLET    Take one tablet by mouth at bedtime for rest  Modified Medications   No medications on file  Discontinued Medications   No medications on file    Review of Systems  Unable to perform ROS: Psychiatric disorder    Filed Vitals:   02/24/15 1430  BP:  102/64  Pulse: 76  Temp: 97.8 F (36.6 C)   There is no weight on file to calculate BMI.  Physical Exam  Constitutional: She appears well-developed. No distress.  Frail appearing in NAD. Lying in bed  HENT:  Mouth/Throat: Mucous membranes are dry. No oropharyngeal exudate or posterior oropharyngeal erythema.  Eyes: Pupils are equal, round, and reactive to light. No scleral icterus.  Neck: Neck supple. Carotid bruit is not present. No tracheal deviation present. No thyromegaly present.  Cardiovascular: Normal rate, regular rhythm, normal heart sounds and intact distal pulses.  Exam reveals no gallop and no friction rub.   No murmur heard. Trace LE edema b/l. No calf TTP. prevalon boots intact b/l  Pulmonary/Chest: Effort normal and breath sounds normal. No stridor. No respiratory distress. She has no wheezes. She has no rales.  Abdominal: Soft. Bowel sounds are normal. She exhibits distension. She exhibits no mass. There is no hepatomegaly. There is no tenderness. There is no rebound and no guarding.  Musculoskeletal: She exhibits edema and tenderness.  Lymphadenopathy:    She has no cervical adenopathy.  Neurological: She is alert.  Paraplegic with b/l foot drop  Skin: Skin is warm and dry. No rash noted.  Psychiatric: Her behavior is normal. Thought content normal. Her speech is slurred. She exhibits a depressed mood.     Labs reviewed: Admission on 01/10/2015, Discharged on 01/17/2015  Component Date Value Ref Range Status  . WBC 01/10/2015  6.2  4.0 - 10.5 K/uL Final  . RBC 01/10/2015 4.04  3.87 - 5.11 MIL/uL Final  . Hemoglobin 01/10/2015 11.3* 12.0 - 15.0 g/dL Final  . HCT 01/10/2015 34.0* 36.0 - 46.0 % Final  . MCV 01/10/2015 84.2  78.0 - 100.0 fL Final  . MCH 01/10/2015 28.0  26.0 - 34.0 pg Final  . MCHC 01/10/2015 33.2  30.0 - 36.0 g/dL Final  . RDW 01/10/2015 15.9* 11.5 - 15.5 % Final  . Platelets 01/10/2015 291  150 - 400 K/uL Final  . Neutrophils Relative % 01/10/2015 71  43 - 77 % Final  . Neutro Abs 01/10/2015 4.4  1.7 - 7.7 K/uL Final  . Lymphocytes Relative 01/10/2015 15  12 - 46 % Final  . Lymphs Abs 01/10/2015 0.9  0.7 - 4.0 K/uL Final  . Monocytes Relative 01/10/2015 8  3 - 12 % Final  . Monocytes Absolute 01/10/2015 0.5  0.1 - 1.0 K/uL Final  . Eosinophils Relative 01/10/2015 6* 0 - 5 % Final  . Eosinophils Absolute 01/10/2015 0.4  0.0 - 0.7 K/uL Final  . Basophils Relative 01/10/2015 0  0 - 1 % Final  . Basophils Absolute 01/10/2015 0.0  0.0 - 0.1 K/uL Final  . Sodium 01/10/2015 140  135 - 145 mmol/L Final  . Potassium 01/10/2015 4.5  3.5 - 5.1 mmol/L Final  . Chloride 01/10/2015 102  101 - 111 mmol/L Final  . CO2 01/10/2015 29  22 - 32 mmol/L Final  . Glucose, Bld 01/10/2015 101* 65 - 99 mg/dL Final  . BUN 01/10/2015 21* 6 - 20 mg/dL Final  . Creatinine, Ser 01/10/2015 0.89  0.44 - 1.00 mg/dL Final  . Calcium 01/10/2015 9.2  8.9 - 10.3 mg/dL Final  . Total Protein 01/10/2015 6.6  6.5 - 8.1 g/dL Final  . Albumin 01/10/2015 3.4* 3.5 - 5.0 g/dL Final  . AST 01/10/2015 36  15 - 41 U/L Final  . ALT 01/10/2015 19  14 - 54 U/L Final  . Alkaline Phosphatase 01/10/2015  128* 38 - 126 U/L Final  . Total Bilirubin 01/10/2015 1.3* 0.3 - 1.2 mg/dL Final  . GFR calc non Af Amer 01/10/2015 >60  >60 mL/min Final  . GFR calc Af Amer 01/10/2015 >60  >60 mL/min Final   Comment: (NOTE) The eGFR has been calculated using the CKD EPI equation. This calculation has not been validated in all clinical situations. eGFR's  persistently <60 mL/min signify possible Chronic Kidney Disease.   . Anion gap 01/10/2015 9  5 - 15 Final  . Lactic Acid, Venous 01/10/2015 1.00  0.5 - 2.0 mmol/L Final  . Troponin i, poc 01/10/2015 0.00  0.00 - 0.08 ng/mL Final  . Comment 3 01/10/2015          Final   Comment: Due to the release kinetics of cTnI, a negative result within the first hours of the onset of symptoms does not rule out myocardial infarction with certainty. If myocardial infarction is still suspected, repeat the test at appropriate intervals.   . Color, Urine 01/10/2015 ORANGE* YELLOW Final   BIOCHEMICALS MAY BE AFFECTED BY COLOR  . APPearance 01/10/2015 CLEAR  CLEAR Final  . Specific Gravity, Urine 01/10/2015 1.013  1.005 - 1.030 Final  . pH 01/10/2015 5.5  5.0 - 8.0 Final  . Glucose, UA 01/10/2015 NEGATIVE  NEGATIVE mg/dL Final  . Hgb urine dipstick 01/10/2015 NEGATIVE  NEGATIVE Final  . Bilirubin Urine 01/10/2015 NEGATIVE  NEGATIVE Final  . Ketones, ur 01/10/2015 NEGATIVE  NEGATIVE mg/dL Final  . Protein, ur 01/10/2015 NEGATIVE  NEGATIVE mg/dL Final  . Urobilinogen, UA 01/10/2015 1.0  0.0 - 1.0 mg/dL Final  . Nitrite 01/10/2015 POSITIVE* NEGATIVE Final  . Leukocytes, UA 01/10/2015 SMALL* NEGATIVE Final  . Specimen Description 01/11/2015 BLOOD LEFT ARM   Final  . Special Requests 01/11/2015 BOTTLES DRAWN AEROBIC AND ANAEROBIC 5CC EA   Final  . Culture 01/11/2015 NO GROWTH 5 DAYS   Final  . Report Status 01/11/2015 01/16/2015 FINAL   Final  . Specimen Description 01/11/2015 BLOOD LEFT HAND   Final  . Special Requests 01/11/2015 BOTTLES DRAWN AEROBIC ONLY 10CC   Final  . Culture 01/11/2015 NO GROWTH 5 DAYS   Final  . Report Status 01/11/2015 01/16/2015 FINAL   Final  . Squamous Epithelial / LPF 01/10/2015 RARE  RARE Final  . WBC, UA 01/10/2015 0-2  <3 WBC/hpf Final  . Bacteria, UA 01/10/2015 MANY* RARE Final  . Casts 01/10/2015 HYALINE CASTS* NEGATIVE Final  . Specimen Description 01/10/2015 URINE,  RANDOM   Final  . Special Requests 01/10/2015 ADDED 01/11/15 0144   Final  . Culture 01/10/2015 >=100,000 COLONIES/mL ENTEROCOCCUS SPECIES   Final  . Report Status 01/10/2015 01/13/2015 FINAL   Final  . Organism ID, Bacteria 01/10/2015 ENTEROCOCCUS SPECIES   Final  . Lactic Acid, Venous 01/11/2015 1.7  0.5 - 2.0 mmol/L Final  . Procalcitonin 01/11/2015 <0.10   Final   Comment:        Interpretation: PCT (Procalcitonin) <= 0.5 ng/mL: Systemic infection (sepsis) is not likely. Local bacterial infection is possible. (NOTE)         ICU PCT Algorithm               Non ICU PCT Algorithm    ----------------------------     ------------------------------         PCT < 0.25 ng/mL                 PCT < 0.1 ng/mL  Stopping of antibiotics            Stopping of antibiotics       strongly encouraged.               strongly encouraged.    ----------------------------     ------------------------------       PCT level decrease by               PCT < 0.25 ng/mL       >= 80% from peak PCT       OR PCT 0.25 - 0.5 ng/mL          Stopping of antibiotics                                             encouraged.     Stopping of antibiotics           encouraged.    ----------------------------     ------------------------------       PCT level decrease by              PCT >= 0.25 ng/mL       < 80% from peak PCT        AND PCT >= 0.5 ng/mL            Continuin                          g antibiotics                                              encouraged.       Continuing antibiotics            encouraged.    ----------------------------     ------------------------------     PCT level increase compared          PCT > 0.5 ng/mL         with peak PCT AND          PCT >= 0.5 ng/mL             Escalation of antibiotics                                          strongly encouraged.      Escalation of antibiotics        strongly encouraged.   . Prothrombin Time 01/11/2015 15.4* 11.6 - 15.2 seconds Final    . INR 01/11/2015 1.21  0.00 - 1.49 Final  . aPTT 01/11/2015 35  24 - 37 seconds Final  . TSH 01/11/2015 1.209  0.350 - 4.500 uIU/mL Final  . Hgb A1c MFr Bld 01/11/2015 5.3  4.8 - 5.6 % Final   Comment: (NOTE)         Pre-diabetes: 5.7 - 6.4         Diabetes: >6.4         Glycemic control for adults with diabetes: <7.0   . Mean Plasma Glucose 01/11/2015 105   Final   Comment: (NOTE) Performed At: [facility] [address] [place], [place] [id number] Lindon Romp  MD [id number]   . Sodium 01/11/2015 145  135 - 145 mmol/L Final  . Potassium 01/11/2015 3.9  3.5 - 5.1 mmol/L Final  . Chloride 01/11/2015 106  101 - 111 mmol/L Final  . CO2 01/11/2015 31  22 - 32 mmol/L Final  . Glucose, Bld 01/11/2015 112* 65 - 99 mg/dL Final  . BUN 01/11/2015 17  6 - 20 mg/dL Final  . Creatinine, Ser 01/11/2015 0.79  0.44 - 1.00 mg/dL Final  . Calcium 01/11/2015 9.3  8.9 - 10.3 mg/dL Final  . Total Protein 01/11/2015 6.4* 6.5 - 8.1 g/dL Final  . Albumin 01/11/2015 3.1* 3.5 - 5.0 g/dL Final  . AST 01/11/2015 18  15 - 41 U/L Final  . ALT 01/11/2015 18  14 - 54 U/L Final  . Alkaline Phosphatase 01/11/2015 121  38 - 126 U/L Final  . Total Bilirubin 01/11/2015 0.3  0.3 - 1.2 mg/dL Final  . GFR calc non Af Amer 01/11/2015 >60  >60 mL/min Final  . GFR calc Af Amer 01/11/2015 >60  >60 mL/min Final   Comment: (NOTE) The eGFR has been calculated using the CKD EPI equation. This calculation has not been validated in all clinical situations. eGFR's persistently <60 mL/min signify possible Chronic Kidney Disease.   . Anion gap 01/11/2015 8  5 - 15 Final  . WBC 01/11/2015 6.3  4.0 - 10.5 K/uL Final  . RBC 01/11/2015 3.72* 3.87 - 5.11 MIL/uL Final  . Hemoglobin 01/11/2015 10.2* 12.0 - 15.0 g/dL Final  . HCT 01/11/2015 31.2* 36.0 - 46.0 % Final  . MCV 01/11/2015 83.9  78.0 - 100.0 fL Final  . MCH 01/11/2015 27.4  26.0 - 34.0 pg Final  . MCHC 01/11/2015 32.7  30.0 - 36.0 g/dL Final  . RDW  01/11/2015 15.8* 11.5 - 15.5 % Final  . Platelets 01/11/2015 256  150 - 400 K/uL Final  . MRSA by PCR 01/11/2015 NEGATIVE  NEGATIVE Final   Comment:        The GeneXpert MRSA Assay (FDA approved for NASAL specimens only), is one component of a comprehensive MRSA colonization surveillance program. It is not intended to diagnose MRSA infection nor to guide or monitor treatment for MRSA infections.   . Glucose-Capillary 01/11/2015 102* 65 - 99 mg/dL Final  . Vitamin B-12 01/12/2015 2487* 180 - 914 pg/mL Final   Comment: (NOTE) This assay is not validated for testing neonatal or myeloproliferative syndrome specimens for Vitamin B12 levels.   . Folate 01/12/2015 24.2  >5.9 ng/mL Final  . Iron 01/12/2015 50  28 - 170 ug/dL Final  . TIBC 01/12/2015 253  250 - 450 ug/dL Final  . Saturation Ratios 01/12/2015 20  10.4 - 31.8 % Final  . UIBC 01/12/2015 203   Final  . Ferritin 01/12/2015 121  11 - 307 ng/mL Final  . Retic Ct Pct 01/12/2015 1.9  0.4 - 3.1 % Final  . RBC. 01/12/2015 3.70* 3.87 - 5.11 MIL/uL Final  . Retic Count, Manual 01/12/2015 70.3  19.0 - 186.0 K/uL Final  . Ammonia 01/12/2015 30  9 - 35 umol/L Final  . RPR Ser Ql 01/12/2015 Non Reactive  Non Reactive Final   Comment: (NOTE) Performed At: [facility] [place], [place] [id number] Lindon Romp MD SJ:[id number]   . HIV Screen 4th Generation wRfx 01/12/2015 Non Reactive  Non Reactive Final   Comment: (NOTE) Performed At: [facility] [place], [place] [id number] Lindon Romp MD  [id number]   . Sodium 01/13/2015 140  135 - 145 mmol/L Final  . Potassium 01/13/2015 3.3* 3.5 - 5.1 mmol/L Final  . Chloride 01/13/2015 103  101 - 111 mmol/L Final  . CO2 01/13/2015 29  22 - 32 mmol/L Final  . Glucose, Bld 01/13/2015 92  65 - 99 mg/dL Final  . BUN 01/13/2015 13  6 - 20 mg/dL Final  . Creatinine, Ser 01/13/2015 1.02* 0.44 - 1.00 mg/dL Final  . Calcium 01/13/2015 8.8* 8.9  - 10.3 mg/dL Final  . GFR calc non Af Amer 01/13/2015 >60  >60 mL/min Final  . GFR calc Af Amer 01/13/2015 >60  >60 mL/min Final   Comment: (NOTE) The eGFR has been calculated using the CKD EPI equation. This calculation has not been validated in all clinical situations. eGFR's persistently <60 mL/min signify possible Chronic Kidney Disease.   . Anion gap 01/13/2015 8  5 - 15 Final  . WBC 01/13/2015 5.4  4.0 - 10.5 K/uL Final  . RBC 01/13/2015 3.60* 3.87 - 5.11 MIL/uL Final  . Hemoglobin 01/13/2015 10.1* 12.0 - 15.0 g/dL Final  . HCT 01/13/2015 30.8* 36.0 - 46.0 % Final  . MCV 01/13/2015 85.6  78.0 - 100.0 fL Final  . MCH 01/13/2015 28.1  26.0 - 34.0 pg Final  . MCHC 01/13/2015 32.8  30.0 - 36.0 g/dL Final  . RDW 01/13/2015 15.9* 11.5 - 15.5 % Final  . Platelets 01/13/2015 262  150 - 400 K/uL Final  . Glucose-Capillary 01/13/2015 82  65 - 99 mg/dL Final  . Comment 1 01/13/2015 Notify RN   Final  . Comment 2 01/13/2015 Document in Chart   Final  . Sodium 01/14/2015 143  135 - 145 mmol/L Final  . Potassium 01/14/2015 3.7  3.5 - 5.1 mmol/L Final  . Chloride 01/14/2015 110  101 - 111 mmol/L Final  . CO2 01/14/2015 26  22 - 32 mmol/L Final  . Glucose, Bld 01/14/2015 111* 65 - 99 mg/dL Final  . BUN 01/14/2015 12  6 - 20 mg/dL Final  . Creatinine, Ser 01/14/2015 0.92  0.44 - 1.00 mg/dL Final  . Calcium 01/14/2015 8.4* 8.9 - 10.3 mg/dL Final  . Total Protein 01/14/2015 5.4* 6.5 - 8.1 g/dL Final  . Albumin 01/14/2015 2.5* 3.5 - 5.0 g/dL Final  . AST 01/14/2015 23  15 - 41 U/L Final  . ALT 01/14/2015 23  14 - 54 U/L Final  . Alkaline Phosphatase 01/14/2015 119  38 - 126 U/L Final  . Total Bilirubin 01/14/2015 0.6  0.3 - 1.2 mg/dL Final  . GFR calc non Af Amer 01/14/2015 >60  >60 mL/min Final  . GFR calc Af Amer 01/14/2015 >60  >60 mL/min Final   Comment: (NOTE) The eGFR has been calculated using the CKD EPI equation. This calculation has not been validated in all clinical  situations. eGFR's persistently <60 mL/min signify possible Chronic Kidney Disease.   . Anion gap 01/14/2015 7  5 - 15 Final  . WBC 01/14/2015 6.3  4.0 - 10.5 K/uL Final  . RBC 01/14/2015 3.48* 3.87 - 5.11 MIL/uL Final  . Hemoglobin 01/14/2015 9.7* 12.0 - 15.0 g/dL Final  . HCT 01/14/2015 29.2* 36.0 - 46.0 % Final  . MCV 01/14/2015 83.9  78.0 - 100.0 fL Final  . MCH 01/14/2015 27.9  26.0 - 34.0 pg Final  . MCHC 01/14/2015 33.2  30.0 - 36.0 g/dL Final  . RDW 01/14/2015 15.8* 11.5 - 15.5 % Final  . Platelets  01/14/2015 245  150 - 400 K/uL Final  . Neutrophils Relative % 01/14/2015 74  43 - 77 % Final  . Neutro Abs 01/14/2015 4.7  1.7 - 7.7 K/uL Final  . Lymphocytes Relative 01/14/2015 12  12 - 46 % Final  . Lymphs Abs 01/14/2015 0.7  0.7 - 4.0 K/uL Final  . Monocytes Relative 01/14/2015 9  3 - 12 % Final  . Monocytes Absolute 01/14/2015 0.5  0.1 - 1.0 K/uL Final  . Eosinophils Relative 01/14/2015 4  0 - 5 % Final  . Eosinophils Absolute 01/14/2015 0.2  0.0 - 0.7 K/uL Final  . Basophils Relative 01/14/2015 1  0 - 1 % Final  . Basophils Absolute 01/14/2015 0.0  0.0 - 0.1 K/uL Final  . Magnesium 01/14/2015 2.2  1.7 - 2.4 mg/dL Final  . Glucose-Capillary 01/14/2015 104* 65 - 99 mg/dL Final  . Comment 1 01/14/2015 Notify RN   Final  . Comment 2 01/14/2015 Document in Chart   Final    No results found.   Assessment/Plan   ICD-9-CM ICD-10-CM   1. Insomnia - uncontrolled 780.52 G47.00   2. Scalp psoriasis - failing to change as expected 696.1 L40.9   3. MULTIPLE SCLEROSIS, PROGRESSIVE/RELAPSING - stable 340 G35   4. Chronic pain syndrome - stable 338.4 G89.4   5. Chronic combined systolic and diastolic congestive heart failure - stable 428.42 I50.42    428.0    6. Depression - stable 311 F32.9   7. Essential hypertension - controlled 401.9 I10   8. Neurogenic bladder due to MS- stable 596.54 N31.9   9. 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 Chronic constipation - stable  564.00 K59.00     --increase ambien 31m qhs  --change shampoo to Derma-Smoothe FS scalp oil - apply thin film to scalp, cover and leave on minimum 4hrs (but preferably overnight), then rinse out. Use weekly  --cont other meds as ordered  --F/u with neurology as scheduled. Cont monthly tysabri infusions per neuro  --maintain fluid restriction 1800 ccper day  --PT/OT/ST as indicated  --will follow  Topacio Cella S. CPerlie Gold PAtlanticare Surgery Center Ocean Countyand Adult Medicine [address] [phone] (Monday-Friday 8 AM - 5 PM) [phone] 5 PM and follow prompts

## 2015-03-04 NOTE — Progress Notes (Signed)
Patient ID: Shannon Garrett, female   DOB: 1960/08/19, 54 y.o.   MRN: [id number]   Facility: [facility]      Allergies  Allergen Reactions  . Sulfonamide Derivatives Hives and Shortness Of Breath  . Penicillins Hives and Itching    Chief Complaint  Patient presents with  . Acute Visit    patient concerns     HPI:   She is concerned about being more lethargic; having difficulty concentrating and having pain management issues. She feels as though no one is listening to her and is taking her issues seriously. We had a prolonged discussion where I reassured her that I am listening to her needs and am doing every thing I can to help her feel and be better. I discussed with her what I am doing that she is not seeing and that  I have been working on getting her to neurology. She is due to go in 2 days. She did verbalize understanding and did not realize what else was going on. We are awaiting her appointment with neurology.    Past Medical History  Diagnosis Date  . MS (multiple sclerosis)     a. Dx'd late 20's. b. Tx with Novantrone, Tysabri, Copaxone previously.  Marland Kitchen Hypertension   . Depression   . Glaucoma   . Cardiomyopathy     a.  Echo 04/29/12: Mild LVH, EF 20-25%, mild AI, moderate MR, moderate LAE, mild RAE, mild RVE, moderate TR, PASP 51, small pericardial effusion;   b. probably non-ischemic given multiple chemo-Tx agents used for MS and global LV dysfn on echo  . Chronic systolic heart failure   . CHF (congestive heart failure)   . Shortness of breath   . Neuromuscular disorder     MS    Past Surgical History  Procedure Laterality Date  . Ablation      uterine  . Cesarean section      VITAL SIGNS BP 118/70 mmHg  Pulse 68  Ht 5\' 5"  (1.651 m)  Wt 215 lb (97.523 kg)  BMI 35.78 kg/m2  Patient's Medications  . amantadine (SYMMETREL) 100 MG capsule Take 100 mg by mouth daily.   . baclofen (LIORESAL) 10 MG tablet Take 1 tablet (10 mg total) by mouth 3  (three) times daily.  . carvedilol (COREG) 25 MG tablet Take 1 tablet (25 mg total) by mouth 2 (two) times daily with a meal.  . Cholecalciferol (VITAMIN D-3 PO) Take 1 tablet by mouth daily.  . Cyanocobalamin (VITAMIN B-12 PO) Take 1 tablet by mouth daily.  Marland Kitchen docusate sodium (COLACE) 100 MG capsule Take 100 mg by mouth 2 (two) times daily.  . DULoxetine (CYMBALTA) 60 MG capsule Take 60 mg by mouth daily.  . furosemide (LASIX) 80 MG tablet  160mg  by mouth every morning and 80mg  by mouth every evening.)  . gabapentin (NEURONTIN) 300 MG capsule Take 3 capsules (900 mg total) by mouth 3 (three) times daily.  Marland Kitchen imipramine (TOFRANIL) 50 MG tablet Take 1.5 tablets (75 mg total) by mouth every evening. For depressive disorder  . latanoprost (XALATAN) 0.005 % ophthalmic solution Place 1 drop into both eyes at bedtime.  . magnesium hydroxide (MILK OF MAGNESIA) 400 MG/5ML suspension Take 30 mLs by mouth every 8 (eight) hours as needed for mild constipation or moderate constipation.  . Multiple Vitamin (MULTIVITAMIN WITH MINERALS) TABS tablet Take 1 tablet by mouth daily.  . natalizumab (TYSABRI) 300 MG/15ML injection Inject 15 mLs (300 mg total) into the  vein once. (Patient not taking: Reported on 12/09/2014)  . phenazopyridine (PYRIDIUM) 100 MG tablet Take 100 mg by mouth 3 (three) times daily as needed for pain (bladder spasms).  . polyethylene glycol (MIRALAX / GLYCOLAX) packet Take 17 g by mouth 2 (two) times daily.  . potassium chloride (KLOR-CON) 20 MEQ packet Take 40 mEq by mouth daily.  . solifenacin (VESICARE) 10 MG tablet Take 10 mg by mouth daily. For bladder spasms  . spironolactone (ALDACTONE) 25 MG tablet Take 25 mg by mouth daily. For CHF  . zolpidem (AMBIEN) 5 MG tablet Take 1 tablet (5 mg total) by mouth at bedtime as needed for sleep.    New Prescriptions   No medications on file  Previous Medications  Modified Medications   No medications on file  Discontinued Medications   No  medications on file     SIGNIFICANT DIAGNOSTIC EXAMS  10-08-14: MRI brain: No significant change in diffuse white matter lesions consistent with patient's history of multiple sclerosis. None of these areas demonstrate abnormal restriction or enhancement as may be seen with areas of acute demyelination. Global atrophy. 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 Right parotid 1 cm lesion does not have typical appearance of an intra parotid lymph node and therefore primary parotid lesion is a possibility. ENT consultation may be considered for further delineation.  10-08-14: mri of cervical; spine: Scattered new areas of altered signal intensity (compared to 2007) throughout the cervical cord as detailed above consistent with patient's history of multiple sclerosis. None of these areas demonstrate enhancement as may be seen with areas of active demyelination. Cervical spondylotic changes as detailed above most prominent C5-6 level.  12-13-14: ABI: right: mild atherosclerotic disease    LABS REVIEWED:   07-16-14: wbc 5.3; hgb 11.6; hct 34.0; mcv 80.8 ;plt 273; glucose 86; bun 13; creat 1.01; k+3.9; na++140 08-14-14: tsh 2.03  09-28-14: wbc 5.7 ;hgb 10.0; hct 31.6; mcv 84.5; plt 323; glucose 87; bun 16.4; creat 1.00; k+3.5; na++142; liver normal albumin 3.7; vit d 33.41 10-05-14: wbc 5.0; hgb 10.8; hct 32.2; mcv 82.8; plt 309; bun 94; bun 24; creat 1.02; k+3.3; na++139; liver normal albumin 3.7; urine culture: enterococcus       Review of Systems Constitutional: Positive for malaise/fatigue.  Respiratory: Negative for cough and shortness of breath.   Cardiovascular: Negative for chest pain, palpitations and leg swelling.  Gastrointestinal: Negative for abdominal pain has constipation.  Musculoskeletal: Positive for myalgias and joint pain.       Pain is  NOT managed   Skin: Negative. Psychiatric/Behavioral: is lethargic difficulty  focusing      Physical Exam Constitutional: She is oriented to person, place, and time. No distress.  Overweight   Neck: Neck supple. No JVD present. No thyromegaly present.  Cardiovascular: Normal rate, regular rhythm and intact distal pulses.   Respiratory: Effort normal and breath sounds normal. No respiratory distress.  GI: Soft. Bowel sounds are normal. She exhibits no distension. There is no tenderness.  Musculoskeletal: She exhibits no edema.  Has limited range of motion in all extremities Unable to stand  Neurological: She is alert .  Skin: Skin is warm and dry. She is not diaphoretic.       ASSESSMENT/ PLAN:  1. MS: no significant change in her status: has paraplegia  will continue amantadine 100 mg daily baclofen 10 mg three times daily for muscle spasms. Is due  to see neurology on 01-07-15. Will not make any further medication changes at this time.  Will await further changes to be made by neurology       Ok Edwards NP [facility]  Contact [phone] Monday through Friday 8am- 5pm  After hours call [phone]

## 2015-03-10 ENCOUNTER — Non-Acute Institutional Stay (SKILLED_NURSING_FACILITY): Payer: Medicare Other | Admitting: Adult Health

## 2015-03-10 DIAGNOSIS — G894 Chronic pain syndrome: Secondary | ICD-10-CM

## 2015-03-16 ENCOUNTER — Encounter: Payer: Self-pay | Admitting: *Deleted

## 2015-03-25 ENCOUNTER — Encounter: Payer: Self-pay | Admitting: Adult Health

## 2015-03-25 NOTE — Progress Notes (Signed)
Patient ID: Shannon Garrett, female   DOB: 1961/05/06, 54 y.o.   MRN: [id number]    Facility: [facility]      Allergies  Allergen Reactions  . Sulfonamide Derivatives Hives and Shortness Of Breath  . Penicillins Hives and Itching    Chief Complaint  Patient presents with  . Acute Visit    pain management     HPI:  She continues to have issues with pain management. She has been on percocet 7.5/325 mg every 6 hours as needed. She tends to forget when the next does is available for her; so her pain tends to get out of control. She would like to have her pain medication scheduled in order to better manage her pain.    Past Medical History  Diagnosis Date  . MS (multiple sclerosis)     a. Dx'd late 20's. b. Tx with Novantrone, Tysabri, Copaxone previously.  Marland Kitchen Hypertension   . Depression   . Glaucoma   . Cardiomyopathy     a.  Echo 04/29/12: Mild LVH, EF 20-25%, mild AI, moderate MR, moderate LAE, mild RAE, mild RVE, moderate TR, PASP 51, small pericardial effusion;   b. probably non-ischemic given multiple chemo-Tx agents used for MS and global LV dysfn on echo  . Chronic systolic heart failure   . CHF (congestive heart failure)   . Shortness of breath   . Neuromuscular disorder     MS    Past Surgical History  Procedure Laterality Date  . Ablation      uterine  . Cesarean section      VITAL SIGNS BP 106/60 mmHg  Pulse 98  Ht $R'5\' 5"'FJ$  (1.651 m)  Wt 211 lb (95.709 kg)  BMI 35.11 kg/m2  SpO2 97%  Patient's Medications  New Prescriptions   No medications on file  Previous Medications   AMANTADINE (SYMMETREL) 100 MG CAPSULE    Take 100 mg by mouth daily.    AMINO ACIDS-PROTEIN HYDROLYS (FEEDING SUPPLEMENT, PRO-STAT SUGAR FREE 64,) LIQD    Take 30 mLs by mouth 2 (two) times daily.   BACLOFEN (LIORESAL) 10 MG TABLET    Take 1 tablet (10 mg total) by mouth 3 (three) times daily.   CARVEDILOL (COREG) 12.5 MG TABLET    Take 1 tablet (12.5 mg total) by mouth 2  (two) times daily with a meal.   CHOLECALCIFEROL (VITAMIN D-3 PO)    Take 1 tablet by mouth daily.   CYANOCOBALAMIN (VITAMIN B-12 PO)    Take 1 tablet by mouth daily.   DOCUSATE SODIUM (COLACE) 100 MG CAPSULE    Take 100 mg by mouth 2 (two) times daily.   DULOXETINE (CYMBALTA) 60 MG CAPSULE    Take 60 mg by mouth daily.   FUROSEMIDE (LASIX) 40 MG TABLET    Take 1 tablet (40 mg total) by mouth daily. Take 160 mg every morning and 80 mg po q HS   GABAPENTIN (NEURONTIN) 300 MG CAPSULE    Take 3 capsules (900 mg total) by mouth 3 (three) times daily.   IMIPRAMINE (TOFRANIL) 50 MG TABLET    Take 1.5 tablets (75 mg total) by mouth every evening. For depressive disorder   LATANOPROST (XALATAN) 0.005 % OPHTHALMIC SOLUTION    Place 1 drop into both eyes at bedtime.   MAGNESIUM HYDROXIDE (MILK OF MAGNESIA) 400 MG/5ML SUSPENSION    Take 30 mLs by mouth every 8 (eight) hours as needed for mild constipation or moderate constipation.   MULTIPLE VITAMIN (  MULTIVITAMIN WITH MINERALS) TABS TABLET    Take 1 tablet by mouth daily.   NATALIZUMAB (TYSABRI) 300 MG/15ML INJECTION    Inject 300 mg into the vein every 30 (thirty) days.   NITROFURANTOIN, MACROCRYSTAL-MONOHYDRATE, (MACROBID) 100 MG CAPSULE    Take 1 capsule (100 mg total) by mouth every 12 (twelve) hours. For 5 more days.   OXYCODONE-ACETAMINOPHEN (PERCOCET) 7.5-325 MG PER TABLET    Take one tablet by mouth every 6 hours as needed for pain. Not to exceed 3gm APAP from all sources/24hr   PHENAZOPYRIDINE (PYRIDIUM) 100 MG TABLET    Take 100 mg by mouth 3 (three) times daily.    POLYETHYLENE GLYCOL (MIRALAX / GLYCOLAX) PACKET    Take 17 g by mouth 2 (two) times daily.   POTASSIUM CHLORIDE (KLOR-CON) 20 MEQ PACKET    Take 20 mEq by mouth daily.   SOLIFENACIN (VESICARE) 10 MG TABLET    Take 10 mg by mouth daily. For bladder spasms   SPIRONOLACTONE (ALDACTONE) 25 MG TABLET    Take 25 mg by mouth daily. For CHF   ZOLPIDEM (AMBIEN) 10 MG TABLET    Take one tablet by  mouth at bedtime for rest  Modified Medications   No medications on file  Discontinued Medications   No medications on file     SIGNIFICANT DIAGNOSTIC EXAMS  10-08-14: MRI brain: No significant change in diffuse white matter lesions consistent with patient's history of multiple sclerosis. None of these areas demonstrate abnormal restriction or enhancement as may be seen with areas of acute demyelination. Global atrophy. 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 Right parotid 1 cm lesion does not have typical appearance of an intra parotid lymph node and therefore primary parotid lesion is a possibility. ENT consultation may be considered for further delineation.  10-08-14: mri of cervical; spine: Scattered new areas of altered signal intensity (compared to 2007) throughout the cervical cord as detailed above consistent with patient's history of multiple sclerosis. None of these areas demonstrate enhancement as may be seen with areas of active demyelination. Cervical spondylotic changes as detailed above most prominent C5-6 level.  12-13-14: ABI: right: mild atherosclerotic disease   01-10-15: ct of head: No acute intracranial abnormalities. Mild atrophy. White matter disease compatible with history of MS.  01-10-15: chest x-ray: No active disease  01-12-15: 2-d echo: Left ventricle: The cavity size was normal. There was mild concentric hypertrophy. Systolic function was normal. The estimated ejection fraction was in the range of 60% to 65%. Wall motion was normal; there were no regional wall motion abnormalities. Doppler parameters are consistent with abnormal left ventricular relaxation (grade 1 diastolic dysfunction). - Aortic valve: There was mild regurgitation. - Mitral valve: Calcified annulus. There was mild regurgitation.   LABS REVIEWED:   07-16-14: wbc 5.3; hgb 11.6; hct 34.0; mcv 80.8 ;plt 273; glucose 86; bun 13; creat  1.01; k+3.9; na++140 08-14-14: tsh 2.03  09-28-14: wbc 5.7 ;hgb 10.0; hct 31.6; mcv 84.5; plt 323; glucose 87; bun 16.4; creat 1.00; k+3.5; na++142; liver normal albumin 3.7; vit d 33.41 10-05-14: wbc 5.0; hgb 10.8; hct 32.2; mcv 82.8; plt 309; bun 94; bun 24; creat 1.02; k+3.3; na++139; liver normal albumin 3.7; urine culture: enterococcus  12-16-14: wbc 5.9; hgb 10.8; hct 35.6; mcv 87.4; plt 273; glucose 111; bun 39.1; creat 1.30; k+3.9; na++139; liver normal albumin 3.9  01-10-15: wbc 6.2; hgb 11.3; hct 34.0; mcv  84.2; plt 291; glucose 101; bun 21; creat 0.89; k+4.5; na++140; alk phos 138; albumin 3.4; ;urine culture: enterococcus 01-11-15: wbc 6.3; hgb 10.2; hct 31.2; mcv 83.9; ;plt 256; glucose 112; bun 17; creat 0.79; k+3.9; na++145; liver normal albumin 3.1; hgb a1c 5.3; tsh 1.209 01-12-15: HIV: nr; RPR: nr; ammonia 30; iron 50; tibc 253; folate 24.2; vit b 12: 2487      Review of Systems Constitutional: Positive for fatigue. Negative for appetite change.  HENT: Negative for congestion.   Respiratory: Negative for cough, chest tightness and shortness of breath.   Cardiovascular: Negative for chest pain, palpitations and leg swelling.  Gastrointestinal: Negative for nausea, abdominal pain, diarrhea and constipation.  Musculoskeletal: has generalized pain all over .  Skin: Negative.   Neurological: Negative for dizziness.  Psychiatric/Behavioral: The patient is not nervous/anxious.     Physical Exam Constitutional: She is oriented to person, place, and time. No distress.  Overweight   Neck: Neck supple. No JVD present. No thyromegaly present.  Cardiovascular: Normal rate, regular rhythm and intact distal pulses.   Respiratory: Effort normal and breath sounds normal. No respiratory distress.  GI: Soft. Bowel sounds are normal. She exhibits no distension. There is no tenderness.  Musculoskeletal: She exhibits no edema.  Is able to move upper extremities Limited movement in her lower  extremities  Neurological: She is alert .  Skin: Skin is warm and dry. She is not diaphoretic.     ASSESSMENT/ PLAN:   1. Chronic pain:she is not receiving adequate pain relief.   will continue cymbalta 60 mg daily; neurontin 900 mg three times daily; tofranil  75 mg nightly and will change her percocet 7.5/325 mg every 6 hours routinely and will monitor her status.     Ok Edwards NP [facility]  Contact [phone] Monday through Friday 8am- 5pm  After hours call [phone]

## 2015-03-31 ENCOUNTER — Non-Acute Institutional Stay (SKILLED_NURSING_FACILITY): Payer: Medicare Other | Admitting: Adult Health

## 2015-03-31 DIAGNOSIS — G8220 Paraplegia, unspecified: Secondary | ICD-10-CM

## 2015-03-31 DIAGNOSIS — I119 Hypertensive heart disease without heart failure: Secondary | ICD-10-CM

## 2015-03-31 DIAGNOSIS — K5900 Constipation, unspecified: Secondary | ICD-10-CM

## 2015-03-31 DIAGNOSIS — N319 Neuromuscular dysfunction of bladder, unspecified: Secondary | ICD-10-CM | POA: Diagnosis not present

## 2015-03-31 DIAGNOSIS — I5042 Chronic combined systolic (congestive) and diastolic (congestive) heart failure: Secondary | ICD-10-CM

## 2015-03-31 DIAGNOSIS — K5909 Other constipation: Secondary | ICD-10-CM

## 2015-03-31 DIAGNOSIS — N390 Urinary tract infection, site not specified: Secondary | ICD-10-CM

## 2015-03-31 DIAGNOSIS — G35 Multiple sclerosis: Secondary | ICD-10-CM | POA: Diagnosis not present

## 2015-04-21 ENCOUNTER — Encounter: Payer: Self-pay | Admitting: Adult Health

## 2015-04-21 ENCOUNTER — Non-Acute Institutional Stay (SKILLED_NURSING_FACILITY): Payer: Medicare Other | Admitting: Adult Health

## 2015-04-21 DIAGNOSIS — I5042 Chronic combined systolic (congestive) and diastolic (congestive) heart failure: Secondary | ICD-10-CM | POA: Diagnosis not present

## 2015-04-21 DIAGNOSIS — G35 Multiple sclerosis: Secondary | ICD-10-CM

## 2015-04-21 DIAGNOSIS — N319 Neuromuscular dysfunction of bladder, unspecified: Secondary | ICD-10-CM | POA: Diagnosis not present

## 2015-04-21 DIAGNOSIS — G8220 Paraplegia, unspecified: Secondary | ICD-10-CM

## 2015-04-21 DIAGNOSIS — N390 Urinary tract infection, site not specified: Secondary | ICD-10-CM | POA: Diagnosis not present

## 2015-04-21 DIAGNOSIS — I119 Hypertensive heart disease without heart failure: Secondary | ICD-10-CM

## 2015-04-21 NOTE — Progress Notes (Signed)
Patient ID: Shannon Garrett, female   DOB: 1961-05-09, 54 y.o.   MRN: [id number]    Facility: [facility]      Allergies  Allergen Reactions  . Sulfonamide Derivatives Hives and Shortness Of Breath  . Penicillins Hives and Itching    Chief Complaint  Patient presents with  . Medical Management of Chronic Issues    HPI:  She is a long term resident of this facility being seen for the management of her chronic illnesses. She has an uti which will require abt. She would like to see urology in order to help prevent further infections. There are no nursing concerns at this time.    Past Medical History  Diagnosis Date  . MS (multiple sclerosis) ([place])     a. Dx'd late 20's. b. Tx with Novantrone, Tysabri, Copaxone previously.  Marland Kitchen Hypertension   . Depression   . Glaucoma   . Cardiomyopathy ([place])     a.  Echo 04/29/12: Mild LVH, EF 20-25%, mild AI, moderate MR, moderate LAE, mild RAE, mild RVE, moderate TR, PASP 51, small pericardial effusion;   b. probably non-ischemic given multiple chemo-Tx agents used for MS and global LV dysfn on echo  . Chronic systolic heart failure ([place])   . CHF (congestive heart failure) ([place])   . Shortness of breath   . Neuromuscular disorder [facility])     MS    Past Surgical History  Procedure Laterality Date  . Ablation      uterine  . Cesarean section      VITAL SIGNS BP 120/64 mmHg  Pulse 87  Ht $R'5\' 5"'cT$  (1.651 m)  Wt 228 lb (103.42 kg)  BMI 37.94 kg/m2  SpO2 98%  Patient's Medications  New Prescriptions   No medications on file  Previous Medications   AMANTADINE (SYMMETREL) 100 MG CAPSULE    Take 100 mg by mouth daily.    BACLOFEN (LIORESAL) 10 MG TABLET    Take 1 tablet (10 mg total) by mouth 3 (three) times daily.   CARVEDILOL (COREG) 12.5 MG TABLET    Take 1 tablet (12.5 mg total) by mouth 2 (two) times daily with a meal.   CHOLECALCIFEROL (VITAMIN D-3 PO)    Take 1 tablet by mouth daily.   CYANOCOBALAMIN (VITAMIN B-12 PO)     Take 1 tablet by mouth daily.   DOCUSATE SODIUM (COLACE) 100 MG CAPSULE    Take 100 mg by mouth 2 (two) times daily.   DULOXETINE (CYMBALTA) 60 MG CAPSULE    Take 60 mg by mouth daily.   FUROSEMIDE (LASIX) 40 MG TABLET    Take 1 tablet (40 mg total) by mouth daily. Take 160 mg every morning and 80 mg po q HS   GABAPENTIN (NEURONTIN) 300 MG CAPSULE    Take 3 capsules (900 mg total) by mouth 3 (three) times daily.   IMIPRAMINE (TOFRANIL) 50 MG TABLET    Take 1.5 tablets (75 mg total) by mouth every evening. For depressive disorder   LATANOPROST (XALATAN) 0.005 % OPHTHALMIC SOLUTION    Place 1 drop into both eyes at bedtime.   MAGNESIUM HYDROXIDE (MILK OF MAGNESIA) 400 MG/5ML SUSPENSION    Take 30 mLs by mouth every 8 (eight) hours as needed for mild constipation or moderate constipation.   MULTIPLE VITAMIN (MULTIVITAMIN WITH MINERALS) TABS TABLET    Take 1 tablet by mouth daily.   NATALIZUMAB (TYSABRI) 300 MG/15ML INJECTION    Inject 300 mg into the vein every 30 (  thirty) days.   NITROFURANTOIN, MACROCRYSTAL-MONOHYDRATE, (MACROBID) 100 MG CAPSULE    Take 1 capsule (100 mg total) by mouth every 12 (twelve) hours. For 5 more days.   OXYCODONE-ACETAMINOPHEN (PERCOCET) 7.5-325 MG PER TABLET    Take one tablet by mouth every 6 hours as needed for pain. Not to exceed 3gm APAP from all sources/24hr   PHENAZOPYRIDINE (PYRIDIUM) 100 MG TABLET    Take 100 mg by mouth 3 (three) times daily.    POLYETHYLENE GLYCOL (MIRALAX / GLYCOLAX) PACKET    Take 17 g by mouth 2 (two) times daily.   POTASSIUM CHLORIDE (KLOR-CON) 20 MEQ PACKET    Take 20 mEq by mouth daily.   SOLIFENACIN (VESICARE) 10 MG TABLET    Take 10 mg by mouth daily. For bladder spasms   SPIRONOLACTONE (ALDACTONE) 25 MG TABLET    Take 25 mg by mouth daily. For CHF   ZOLPIDEM (AMBIEN) 10 MG TABLET    Take one tablet by mouth at bedtime for rest  Modified Medications   No medications on file  Discontinued Medications   No medications on file      SIGNIFICANT DIAGNOSTIC EXAMS   10-08-14: MRI brain: No significant change in diffuse white matter lesions consistent with patient's history of multiple sclerosis. None of these areas demonstrate abnormal restriction or enhancement as may be seen with areas of acute demyelination. Global atrophy. 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 Right parotid 1 cm lesion does not have typical appearance of an intra parotid lymph node and therefore primary parotid lesion is a possibility. ENT consultation may be considered for further delineation.  10-08-14: mri of cervical; spine: Scattered new areas of altered signal intensity (compared to 2007) throughout the cervical cord as detailed above consistent with patient's history of multiple sclerosis. None of these areas demonstrate enhancement as may be seen with areas of active demyelination. Cervical spondylotic changes as detailed above most prominent C5-6 level.  12-13-14: ABI: right: mild atherosclerotic disease   01-10-15: ct of head: No acute intracranial abnormalities. Mild atrophy. White matter disease compatible with history of MS.  01-10-15: chest x-ray: No active disease  01-12-15: 2-d echo: Left ventricle: The cavity size was normal. There was mild concentric hypertrophy. Systolic function was normal. The estimated ejection fraction was in the range of 60% to 65%. Wall motion was normal; there were no regional wall motion abnormalities. Doppler parameters are consistent with abnormal left ventricular relaxation (grade 1 diastolic dysfunction). - Aortic valve: There was mild regurgitation. - Mitral valve: Calcified annulus. There was mild regurgitation.   LABS REVIEWED:   07-16-14: wbc 5.3; hgb 11.6; hct 34.0; mcv 80.8 ;plt 273; glucose 86; bun 13; creat 1.01; k+3.9; na++140 08-14-14: tsh 2.03  09-28-14: wbc 5.7 ;hgb 10.0; hct 31.6; mcv 84.5; plt 323; glucose 87; bun 16.4; creat  1.00; k+3.5; na++142; liver normal albumin 3.7; vit d 33.41 10-05-14: wbc 5.0; hgb 10.8; hct 32.2; mcv 82.8; plt 309; bun 94; bun 24; creat 1.02; k+3.3; na++139; liver normal albumin 3.7; urine culture: enterococcus  12-16-14: wbc 5.9; hgb 10.8; hct 35.6; mcv 87.4; plt 273; glucose 111; bun 39.1; creat 1.30; k+3.9; na++139; liver normal albumin 3.9  01-10-15: wbc 6.2; hgb 11.3; hct 34.0; mcv 84.2; plt 291; glucose 101; bun 21; creat 0.89; k+4.5; na++140; alk phos 138; albumin 3.4; ;urine culture: enterococcus 01-11-15: wbc 6.3; hgb 10.2; hct 31.2; mcv 83.9; ;plt 256; glucose  112; bun 17; creat 0.79; k+3.9; na++145; liver normal albumin 3.1; hgb a1c 5.3; tsh 1.209 01-12-15: HIV: nr; RPR: nr; ammonia 30; iron 50; tibc 253; folate 24.2; vit b 12: 2487  04-18-15: enterococcus faecalis: macrobid     Review of Systems Constitutional: Positive for fatigue. Negative for appetite change.  HENT: Negative for congestion.   Respiratory: Negative for cough, chest tightness and shortness of breath.   Cardiovascular: Negative for chest pain, palpitations and leg swelling.  Gastrointestinal: Negative for nausea, abdominal pain, diarrhea and constipation.  Musculoskeletal: pain is adequately managed .  Skin: Negative.   Neurological: Negative for dizziness.  Psychiatric/Behavioral: The patient is not nervous/anxious.      Physical Exam Constitutional: She is oriented to person, place, and time. No distress.  Overweight   Neck: Neck supple. No JVD present. No thyromegaly present.  Cardiovascular: Normal rate, regular rhythm and intact distal pulses.   Respiratory: Effort normal and breath sounds normal. No respiratory distress.  GI: Soft. Bowel sounds are normal. She exhibits no distension. There is no tenderness.  Musculoskeletal: She exhibits no edema.  Is able to move upper extremities Limited movement in her lower extremities  Neurological: She is alert .  Skin: Skin is warm and dry. She is not  diaphoretic.       ASSESSMENT/ PLAN:   1. MS: no change in her status.  Is followed by  neurology;  Has paraplegia.will continue amantadine 100 mg daily baclofen 10 mg three times daily for muscle spasms; is  taking Tysabri facility    2. Hypertension: will continue coreg 12.5 mg twice daily and will monitor   3. Chronic combined systolic and diastolic heart failure: is stable will continue lasix 160 mg in the am and 80 mg in the pm; with k+ 20 meq daily; will continue aldactone 25 mg daily; her EF is 60-65%.   4. Neurogenic bladder: takes vesicare 10 mg daily and take pyridium 100 mg three times daily   5. Depression: will continue cymbalta 60 mg daily and ambien 10 mg nightly  For sleep   6. Chronic pain: she is presently stable   will continue cymbalta 60 mg daily; neurontin 900 mg three times daily; percocet 7.5/325 mg every 6 hours routinely   tofranil  75 mg nightly   9. Constipation: will continue  miralax  twice  daily and colace twice daily   10. UTI: will begin macrobid 100 mg twice daily for 10 days for uti with florastor twice daily; will begin cranberry 425 mg daily and will setup appointment with urology and will monitor      Ok Edwards NP [facility]  Contact [phone] Monday through Friday 8am- 5pm  After hours call [phone]

## 2015-04-21 NOTE — Progress Notes (Signed)
Patient ID: Shannon Garrett, female   DOB: [birth year], 54 y.o.   MRN: [id number]    Facility: [facility]      Allergies  Allergen Reactions  . Sulfonamide Derivatives Hives and Shortness Of Breath  . Penicillins Hives and Itching    Chief Complaint  Patient presents with  . Medical Management of Chronic Issues    HPI:  She is a long term resident of this facility being seen for the management of her chronic illnesses. Overall her status is without significant change. She is receiving her IV infusions for her MS. She is not voicing any concerns at this time. There are no nursing concerns at this time.    Past Medical History  Diagnosis Date  . MS (multiple sclerosis) ([place])     a. Dx'd late 20's. b. Tx with Novantrone, Tysabri, Copaxone previously.  Marland Kitchen Hypertension   . Depression   . Glaucoma   . Cardiomyopathy ([place])     a.  Echo 04/29/12: Mild LVH, EF 20-25%, mild AI, moderate MR, moderate LAE, mild RAE, mild RVE, moderate TR, PASP 51, small pericardial effusion;   b. probably non-ischemic given multiple chemo-Tx agents used for MS and global LV dysfn on echo  . Chronic systolic heart failure ([place])   . CHF (congestive heart failure) ([place])   . Shortness of breath   . Neuromuscular disorder [facility])     MS    Past Surgical History  Procedure Laterality Date  . Ablation      uterine  . Cesarean section      VITAL SIGNS BP 126/68 mmHg  Pulse 68  Ht $R'5\' 5"'ZS$  (1.651 m)  Wt 217 lb (98.431 kg)  BMI 36.11 kg/m2  SpO2 97%  Patient's Medications  New Prescriptions   No medications on file  Previous Medications   AMANTADINE (SYMMETREL) 100 MG CAPSULE    Take 100 mg by mouth daily.    BACLOFEN (LIORESAL) 10 MG TABLET    Take 1 tablet (10 mg total) by mouth 3 (three) times daily.   CARVEDILOL (COREG) 12.5 MG TABLET    Take 1 tablet (12.5 mg total) by mouth 2 (two) times daily with a meal.   CHOLECALCIFEROL (VITAMIN D-3 PO)    Take 1 tablet by mouth daily.   CYANOCOBALAMIN (VITAMIN B-12 PO)    Take 1 tablet by mouth daily.   DOCUSATE SODIUM (COLACE) 100 MG CAPSULE    Take 100 mg by mouth 2 (two) times daily.   DULOXETINE (CYMBALTA) 60 MG CAPSULE    Take 60 mg by mouth daily.   FUROSEMIDE (LASIX) 40 MG TABLET    Take 1 tablet (40 mg total) by mouth daily. Take 160 mg every morning and 80 mg po q HS   GABAPENTIN (NEURONTIN) 300 MG CAPSULE    Take 3 capsules (900 mg total) by mouth 3 (three) times daily.   IMIPRAMINE (TOFRANIL) 50 MG TABLET    Take 1.5 tablets (75 mg total) by mouth every evening. For depressive disorder   LATANOPROST (XALATAN) 0.005 % OPHTHALMIC SOLUTION    Place 1 drop into both eyes at bedtime.   MAGNESIUM HYDROXIDE (MILK OF MAGNESIA) 400 MG/5ML SUSPENSION    Take 30 mLs by mouth every 8 (eight) hours as needed for mild constipation or moderate constipation.   MULTIPLE VITAMIN (MULTIVITAMIN WITH MINERALS) TABS TABLET    Take 1 tablet by mouth daily.   NATALIZUMAB (TYSABRI) 300 MG/15ML INJECTION    Inject 300 mg into the  vein every 30 (thirty) days.   NITROFURANTOIN, MACROCRYSTAL-MONOHYDRATE, (MACROBID) 100 MG CAPSULE    Take 1 capsule (100 mg total) by mouth every 12 (twelve) hours. For 5 more days.   OXYCODONE-ACETAMINOPHEN (PERCOCET) 7.5-325 MG PER TABLET    Take one tablet by mouth every 6 hours as needed for pain. Not to exceed 3gm APAP from all sources/24hr   PHENAZOPYRIDINE (PYRIDIUM) 100 MG TABLET    Take 100 mg by mouth 3 (three) times daily.    POLYETHYLENE GLYCOL (MIRALAX / GLYCOLAX) PACKET    Take 17 g by mouth 2 (two) times daily.   POTASSIUM CHLORIDE (KLOR-CON) 20 MEQ PACKET    Take 20 mEq by mouth daily.   SOLIFENACIN (VESICARE) 10 MG TABLET    Take 10 mg by mouth daily. For bladder spasms   SPIRONOLACTONE (ALDACTONE) 25 MG TABLET    Take 25 mg by mouth daily. For CHF   ZOLPIDEM (AMBIEN) 10 MG TABLET    Take one tablet by mouth at bedtime for rest  Modified Medications   No medications on file  Discontinued Medications     AMINO ACIDS-PROTEIN HYDROLYS (FEEDING SUPPLEMENT, PRO-STAT SUGAR FREE 64,) LIQD    Take 30 mLs by mouth 2 (two) times daily.     SIGNIFICANT DIAGNOSTIC EXAMS   10-08-14: MRI brain: No significant change in diffuse white matter lesions consistent with patient's history of multiple sclerosis. None of these areas demonstrate abnormal restriction or enhancement as may be seen with areas of acute demyelination. Global atrophy. 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 Right parotid 1 cm lesion does not have typical appearance of an intra parotid lymph node and therefore primary parotid lesion is a possibility. ENT consultation may be considered for further delineation.  10-08-14: mri of cervical; spine: Scattered new areas of altered signal intensity (compared to 2007) throughout the cervical cord as detailed above consistent with patient's history of multiple sclerosis. None of these areas demonstrate enhancement as may be seen with areas of active demyelination. Cervical spondylotic changes as detailed above most prominent C5-6 level.  12-13-14: ABI: right: mild atherosclerotic disease   01-10-15: ct of head: No acute intracranial abnormalities. Mild atrophy. White matter disease compatible with history of MS.  01-10-15: chest x-ray: No active disease  01-12-15: 2-d echo: Left ventricle: The cavity size was normal. There was mild concentric hypertrophy. Systolic function was normal. The estimated ejection fraction was in the range of 60% to 65%. Wall motion was normal; there were no regional wall motion abnormalities. Doppler parameters are consistent with abnormal left ventricular relaxation (grade 1 diastolic dysfunction). - Aortic valve: There was mild regurgitation. - Mitral valve: Calcified annulus. There was mild regurgitation.   LABS REVIEWED:   07-16-14: wbc 5.3; hgb 11.6; hct 34.0; mcv 80.8 ;plt 273; glucose 86; bun 13; creat  1.01; k+3.9; na++140 08-14-14: tsh 2.03  09-28-14: wbc 5.7 ;hgb 10.0; hct 31.6; mcv 84.5; plt 323; glucose 87; bun 16.4; creat 1.00; k+3.5; na++142; liver normal albumin 3.7; vit d 33.41 10-05-14: wbc 5.0; hgb 10.8; hct 32.2; mcv 82.8; plt 309; bun 94; bun 24; creat 1.02; k+3.3; na++139; liver normal albumin 3.7; urine culture: enterococcus  12-16-14: wbc 5.9; hgb 10.8; hct 35.6; mcv 87.4; plt 273; glucose 111; bun 39.1; creat 1.30; k+3.9; na++139; liver normal albumin 3.9  01-10-15: wbc 6.2; hgb 11.3; hct 34.0; mcv 84.2; plt 291; glucose 101; bun 21; creat 0.89;  k+4.5; na++140; alk phos 138; albumin 3.4; ;urine culture: enterococcus 01-11-15: wbc 6.3; hgb 10.2; hct 31.2; mcv 83.9; ;plt 256; glucose 112; bun 17; creat 0.79; k+3.9; na++145; liver normal albumin 3.1; hgb a1c 5.3; tsh 1.209 01-12-15: HIV: nr; RPR: nr; ammonia 30; iron 50; tibc 253; folate 24.2; vit b 12: 2487      Review of Systems Constitutional: Positive for fatigue. Negative for appetite change.  HENT: Negative for congestion.   Respiratory: Negative for cough, chest tightness and shortness of breath.   Cardiovascular: Negative for chest pain, palpitations and leg swelling.  Gastrointestinal: Negative for nausea, abdominal pain, diarrhea and constipation.  Musculoskeletal: pain is adequately managed .  Skin: Negative.   Neurological: Negative for dizziness.  Psychiatric/Behavioral: The patient is not nervous/anxious.       Physical Exam Constitutional: She is oriented to person, place, and time. No distress.  Overweight   Neck: Neck supple. No JVD present. No thyromegaly present.  Cardiovascular: Normal rate, regular rhythm and intact distal pulses.   Respiratory: Effort normal and breath sounds normal. No respiratory distress.  GI: Soft. Bowel sounds are normal. She exhibits no distension. There is no tenderness.  Musculoskeletal: She exhibits no edema.  Is able to move upper extremities Limited movement in her lower  extremities  Neurological: She is alert .  Skin: Skin is warm and dry. She is not diaphoretic.       ASSESSMENT/ PLAN:   1. MS: no change in her status.  Is followed by  neurology;  Has paraplegia.will continue amantadine 100 mg daily baclofen 10 mg three times daily for muscle spasms; is  taking Tysabri facility    2. Hypertension: will continue coreg 12.5 mg twice daily and will monitor   3. Chronic combined systolic and diastolic heart failure: is stable will continue lasix 160 mg in the am and 80 mg in the pm; with k+ 20 meq daily; will continue aldactone 25 mg daily; her EF is 60-65%.   4. Neurogenic bladder: takes vesicare 10 mg daily and take pyridium 100 mg three times daily   5. Depression: will continue cymbalta 60 mg daily and ambien 10 mg nightly  For sleep   6. Chronic pain: she is presently stable   will continue cymbalta 60 mg daily; neurontin 900 mg three times daily; percocet 7.5/325 mg every 6 hours routinely   tofranil  75 mg nightly   9. Constipation: will continue  miralax  twice  daily   10. UTI: no recent infection; will monitor     Ok Edwards NP [facility] Adult [facility]  Contact [phone] Monday through Friday 8am- 5pm  After hours call [phone]

## 2015-05-18 ENCOUNTER — Ambulatory Visit (INDEPENDENT_AMBULATORY_CARE_PROVIDER_SITE_OTHER): Payer: Medicare Other | Admitting: Neurology

## 2015-05-18 ENCOUNTER — Encounter: Payer: Self-pay | Admitting: Neurology

## 2015-05-18 VITALS — BP 118/80 | HR 66 | Resp 14 | Ht 65.0 in | Wt 228.0 lb

## 2015-05-18 DIAGNOSIS — F329 Major depressive disorder, single episode, unspecified: Secondary | ICD-10-CM

## 2015-05-18 DIAGNOSIS — G35 Multiple sclerosis: Secondary | ICD-10-CM

## 2015-05-18 DIAGNOSIS — G8220 Paraplegia, unspecified: Secondary | ICD-10-CM

## 2015-05-18 DIAGNOSIS — N319 Neuromuscular dysfunction of bladder, unspecified: Secondary | ICD-10-CM | POA: Diagnosis not present

## 2015-05-18 DIAGNOSIS — G894 Chronic pain syndrome: Secondary | ICD-10-CM

## 2015-05-18 DIAGNOSIS — F32A Depression, unspecified: Secondary | ICD-10-CM

## 2015-05-18 NOTE — Progress Notes (Signed)
[facility]  PATIENT: Shannon Garrett DOB: February 17, 1961  REFERRING DOCTOR OR PCP:  Gildardo Cranker SOURCE: Patient, records from EMR, MRI images on PACS.  _________________________________   HISTORICAL  CHIEF COMPLAINT:  Chief Complaint  Patient presents with  . Multiple Sclerosis    Sts. she tolerates Tysabri well.  Her last infusion was on 04-21-15, next infusion is tomorrow .  Needs JCV ab checked today.  Sts. she has a harder time sitting straight up--leans to the right more/fim    HISTORY OF PRESENT ILLNESS:  Shannon Garrett is a 54 year old woman with multiple sclerosis.    She feels she is getting weaker in her left arm and legs are about the same.    She is back on Tysabri and tolerates it well. She was on it ofr a couple years but missed several doses this year.    She has had a couple UTIs the past few months and is concerned she might have another one.    Gait/strength/sensation: She is unable to walk and uses a wheelchair much of the day.    He has weakness in all extremities, worse on the left associated with spasticity that is also worse on the left.  She denies much numbness and occasionally will get some tingling in the left arm and leg.   She has spasticity, helped by baclofen a little bit (MJ helped more in past).   Bladder:   She notes bladder spasms that are only partially helped by oxybutynin. She is noting more retention and has had more UTI's lately.   She has urinary incontinence at times and wears diapers at her current nursing home.  Vision: She denies any major problem with her vision.  Fatigue/sleep: She feels tired most days, physical and cognitive fatigue. She has difficulty falling asleep and staying asleep. The sleep onset insomnia is worse than the sleep maintenance.  Mood/cognition: She notes depression but no anxiety and she gets frustrated easily.  She has occasional crying spells.   She is on Cymbalta and tolerates it well.   She  denies any major problems with cognition but feels she was able to think and process information better in the past.  Pain:  She reports pain in all 4 limbs and in her back. The pain is worse in the left arm. Percocet helped mildly.  MS history:     She was diagnosed with multiple sclerosis in the 1980s after presenting with optic neuritis, numbness in her legs and vertigo MRI was consistent with MS. Initially she was not placed on any medication as none were approved. She had multiple exacerbations and by the 1990s was having difficulty with her gait initially she was placed on Avonex and remain on that medication for many years.   She thinks I first saw her about 8 years ago.   She thinks she was switched to Tysabri but continued to have some progression. Therefore, Novantrone was tried.   She feels that Novantrone did slow her progression down significantly. However, this needed to be stopped after she developed congestive heart failure.  She switched her care to Dr. Jacqulynn Cadet and went back on Tysabri therapy but stopped seeing him when he moved to the [place] area about 1-1/2 years ago.       REVIEW OF SYSTEMS: Constitutional: No fevers, chills, sweats, or change in appetite.  Notes fatigue and insomnia Eyes: No visual changes, double vision, eye pain Ear, nose and throat: No hearing loss, ear pain, nasal congestion,  sore throat Cardiovascular: No chest pain, palpitations Respiratory: No shortness of breath at rest or with exertion.   No wheezes GastrointestinaI: No nausea, vomiting, diarrhea, abdominal pain, fecal incontinence Genitourinary: as above Musculoskeletal: Some neck pain, back pain Integumentary: No rash, pruritus, skin lesions Neurological: as above Psychiatric: as above Endocrine: No palpitations, diaphoresis, change in appetite, change in weigh or increased thirst Hematologic/Lymphatic: No anemia, purpura, petechiae. Allergic/Immunologic: No itchy/runny eyes, nasal  congestion, recent allergic reactions, rashes  ALLERGIES: Allergies  Allergen Reactions  . Sulfonamide Derivatives Hives and Shortness Of Breath  . Penicillins Hives and Itching    HOME MEDICATIONS:  Current outpatient prescriptions:  .  amantadine (SYMMETREL) 100 MG capsule, Take 100 mg by mouth daily. , Disp: , Rfl:  .  baclofen (LIORESAL) 10 MG tablet, Take 1 tablet (10 mg total) by mouth 3 (three) times daily., Disp: 90 each, Rfl: 5 .  carvedilol (COREG) 12.5 MG tablet, Take 1 tablet (12.5 mg total) by mouth 2 (two) times daily with a meal., Disp: , Rfl:  .  Cholecalciferol (VITAMIN D-3 PO), Take 1 tablet by mouth daily., Disp: , Rfl:  .  Cyanocobalamin (VITAMIN B-12 PO), Take 1 tablet by mouth daily., Disp: , Rfl:  .  docusate sodium (COLACE) 100 MG capsule, Take 100 mg by mouth 2 (two) times daily., Disp: , Rfl:  .  DULoxetine (CYMBALTA) 60 MG capsule, Take 60 mg by mouth daily., Disp: , Rfl:  .  furosemide (LASIX) 40 MG tablet, Take 1 tablet (40 mg total) by mouth daily. Take 160 mg every morning and 80 mg po q HS, Disp: , Rfl:  .  gabapentin (NEURONTIN) 300 MG capsule, Take 3 capsules (900 mg total) by mouth 3 (three) times daily., Disp: 270 capsule, Rfl: 5 .  imipramine (TOFRANIL) 50 MG tablet, Take 1.5 tablets (75 mg total) by mouth every evening. For depressive disorder, Disp: 90 tablet, Rfl: 11 .  latanoprost (XALATAN) 0.005 % ophthalmic solution, Place 1 drop into both eyes at bedtime., Disp: , Rfl:  .  magnesium hydroxide (MILK OF MAGNESIA) 400 MG/5ML suspension, Take 30 mLs by mouth every 8 (eight) hours as needed for mild constipation or moderate constipation., Disp: , Rfl:  .  Multiple Vitamin (MULTIVITAMIN WITH MINERALS) TABS tablet, Take 1 tablet by mouth daily., Disp: , Rfl:  .  natalizumab (TYSABRI) 300 MG/15ML injection, Inject 300 mg into the vein every 30 (thirty) days., Disp: , Rfl:  .  nitrofurantoin, macrocrystal-monohydrate, (MACROBID) 100 MG capsule, Take 1  capsule (100 mg total) by mouth every 12 (twelve) hours. For 5 more days., Disp: , Rfl:  .  oxyCODONE-acetaminophen (PERCOCET) 7.5-325 MG per tablet, Take one tablet by mouth every 6 hours as needed for pain. Not to exceed 3gm APAP from all sources/24hr, Disp: 120 tablet, Rfl: 0 .  phenazopyridine (PYRIDIUM) 100 MG tablet, Take 100 mg by mouth 3 (three) times daily. , Disp: , Rfl:  .  polyethylene glycol (MIRALAX / GLYCOLAX) packet, Take 17 g by mouth 2 (two) times daily., Disp: 120 packet, Rfl: 11 .  potassium chloride (KLOR-CON) 20 MEQ packet, Take 20 mEq by mouth daily., Disp: , Rfl:  .  solifenacin (VESICARE) 10 MG tablet, Take 10 mg by mouth daily. For bladder spasms, Disp: , Rfl:  .  spironolactone (ALDACTONE) 25 MG tablet, Take 25 mg by mouth daily. For CHF, Disp: , Rfl:  .  zolpidem (AMBIEN) 10 MG tablet, Take one tablet by mouth at bedtime for rest, Disp: 30  tablet, Rfl: 5  PAST MEDICAL HISTORY: Past Medical History  Diagnosis Date  . MS (multiple sclerosis) ([place])     a. Dx'd late 20's. b. Tx with Novantrone, Tysabri, Copaxone previously.  Marland Kitchen Hypertension   . Depression   . Glaucoma   . Cardiomyopathy ([place])     a.  Echo 04/29/12: Mild LVH, EF 20-25%, mild AI, moderate MR, moderate LAE, mild RAE, mild RVE, moderate TR, PASP 51, small pericardial effusion;   b. probably non-ischemic given multiple chemo-Tx agents used for MS and global LV dysfn on echo  . Chronic systolic heart failure ([place])   . CHF (congestive heart failure) ([place])   . Shortness of breath   . Neuromuscular disorder ([place])     MS    PAST SURGICAL HISTORY: Past Surgical History  Procedure Laterality Date  . Ablation      uterine  . Cesarean section      FAMILY HISTORY: Family History  Problem Relation Age of Onset  . Hypertension Mother   . Heart attack Neg Hx   . Cancer Mother     breast   . Cancer Father     prostate  . Multiple sclerosis Sister     SOCIAL HISTORY:  Social History   Social  History  . Marital Status: Single    Spouse Name: N/A  . Number of Children: N/A  . Years of Education: N/A   Occupational History  . Not on file.   Social History Main Topics  . Smoking status: Former Research scientist (life sciences)  . Smokeless tobacco: Never Used  . Alcohol Use: No  . Drug Use: No  . Sexual Activity: Not Currently   Other Topics Concern  . Not on file   Social History Narrative     PHYSICAL EXAM  Filed Vitals:   05/18/15 1534  BP: 118/80  Pulse: 66  Resp: 14  Height: 5\' 5"  (1.651 m)  Weight: 228 lb (103.42 kg)    Body mass index is 37.94 kg/(m^2).   General: The patient is well-developed and well-nourished and in no acute distress  Skin: Extremities show minimal ankle edema.   Neurologic Exam  Mental status: The patient is alert and oriented x 3 at the time of the examination. The patient has apparent normal recent and remote memory, with an apparently normal attention span and concentration ability.   Speech is normal.  Cranial nerves: Extraocular movements are full. Pupils are equal, round, and reactive to light and accomodation.    Facial symmetry is present. There is good facial sensation to soft touch bilaterally.Facial strength is normal.  Trapezius and sternocleidomastoid strength is normal. No dysarthria is noted.  The tongue is midline, and the patient has symmetric elevation of the soft palate. No obvious hearing deficits are noted.  Motor:  Muscle bulk is normal.   Tone is increase in the legs greater than the arms, left worse than right. Strength is 4+/5 in the right arm, 4 minus/5 in the left arm, 2/5 in the right leg and 2 minus/5 in the left leg..   Sensory: Sensory testing is intact to touch and vibration sensation in all 4 extremities.  Coordination: Cerebellar testing reveals good left and poor right finger-nose-finger bilaterally.  Gait and station: she is bedridden.   Reflexes: Deep tendon reflexes are increased in both legs with clonus.     DIAGNOSTIC DATA (LABS, IMAGING, TESTING) - I reviewed patient records, labs, notes, testing and imaging myself where available.  Lab Results  Component  Value Date   WBC 6.3 01/14/2015   HGB 9.7* 01/14/2015   HCT 29.2* 01/14/2015   MCV 83.9 01/14/2015   PLT 245 01/14/2015      Component Value Date/Time   NA 143 01/14/2015 0912   NA 142 09/28/2014   K 3.7 01/14/2015 0912   CL 110 01/14/2015 0912   CO2 26 01/14/2015 0912   GLUCOSE 111* 01/14/2015 0912   BUN 12 01/14/2015 0912   BUN 16 09/28/2014   CREATININE 0.92 01/14/2015 0912   CALCIUM 8.4* 01/14/2015 0912   PROT 5.4* 01/14/2015 0912   ALBUMIN 2.5* 01/14/2015 0912   AST 23 01/14/2015 0912   ALT 23 01/14/2015 0912   ALKPHOS 119 01/14/2015 0912   BILITOT 0.6 01/14/2015 0912   GFRNONAA >60 01/14/2015 0912   GFRAA >60 01/14/2015 0912   Lab Results  Component Value Date   CHOL 145 05/01/2012   HDL 23* 05/01/2012   LDLCALC 108* 05/01/2012   TRIG 72 05/01/2012   CHOLHDL 6.3 05/01/2012   Lab Results  Component Value Date   HGBA1C 5.3 01/11/2015   Lab Results  Component Value Date   VITAMINB12 2487* 01/12/2015   Lab Results  Component Value Date   TSH 1.209 01/11/2015       ASSESSMENT AND PLAN  MULTIPLE SCLEROSIS, PROGRESSIVE/RELAPSING  Paraplegia (HCC)  Chronic pain syndrome  Depression  Neurogenic bladder  1.   Continue Tysabri.   Check JCV Ab 2.   tamsulosin for bladder hesitancy.   Advised to f/u with urology 3.  Out of bed to chair daily.     She will return in about 5-6 months or sooner if she has new or worsening neurologic symptoms.   Richard A. Felecia Shelling, MD, PhD [id number], [id number] PM Certified in Neurology, Clinical Neurophysiology, Sleep Medicine, Pain Medicine and Neuroimaging  [facility] [address], [place], [place] [address] [phone]

## 2015-05-19 LAB — CBC WITH DIFFERENTIAL/PLATELET
Basophils Absolute: 0.1 10*3/uL (ref 0.0–0.2)
Basos: 2 %
EOS (ABSOLUTE): 0.5 10*3/uL — AB (ref 0.0–0.4)
Eos: 8 %
HEMOGLOBIN: 9.6 g/dL — AB (ref 11.1–15.9)
Hematocrit: 29.6 % — ABNORMAL LOW (ref 34.0–46.6)
IMMATURE GRANS (ABS): 0 10*3/uL (ref 0.0–0.1)
IMMATURE GRANULOCYTES: 1 %
LYMPHS: 34 %
Lymphocytes Absolute: 2.1 10*3/uL (ref 0.7–3.1)
MCH: 27 pg (ref 26.6–33.0)
MCHC: 32.4 g/dL (ref 31.5–35.7)
MCV: 83 fL (ref 79–97)
MONOCYTES: 8 %
Monocytes Absolute: 0.5 10*3/uL (ref 0.1–0.9)
NEUTROS ABS: 3 10*3/uL (ref 1.4–7.0)
NEUTROS PCT: 47 %
Platelets: 222 10*3/uL (ref 150–379)
RBC: 3.55 x10E6/uL — AB (ref 3.77–5.28)
RDW: 16.6 % — ABNORMAL HIGH (ref 12.3–15.4)
WBC: 6.2 10*3/uL (ref 3.4–10.8)

## 2015-05-23 ENCOUNTER — Encounter: Payer: Self-pay | Admitting: *Deleted

## 2015-05-23 ENCOUNTER — Non-Acute Institutional Stay (SKILLED_NURSING_FACILITY): Payer: Medicare Other | Admitting: Internal Medicine

## 2015-05-23 ENCOUNTER — Encounter: Payer: Self-pay | Admitting: Internal Medicine

## 2015-05-23 DIAGNOSIS — F329 Major depressive disorder, single episode, unspecified: Secondary | ICD-10-CM

## 2015-05-23 DIAGNOSIS — N319 Neuromuscular dysfunction of bladder, unspecified: Secondary | ICD-10-CM | POA: Diagnosis not present

## 2015-05-23 DIAGNOSIS — G35 Multiple sclerosis: Secondary | ICD-10-CM

## 2015-05-23 DIAGNOSIS — F32A Depression, unspecified: Secondary | ICD-10-CM

## 2015-05-23 NOTE — Progress Notes (Signed)
MRN: [id number] Name: Shannon Garrett  Sex: female Age: 54 y.o. DOB: May 31, 1961  [place] #: [facility] Facility/Room: Level Of Care: SNF Provider: Inocencio Homes D Emergency Contacts: Extended Emergency Contact Information Primary Emergency Contact: [place], [place] [address] [place] Phone: [phone] Relation: Sister Secondary Emergency Contact: Jefm Bryant, [place] Phone: [phone] Mobile Phone: [phone] Relation: Friend  Code Status:   Allergies: Sulfonamide derivatives and Penicillins  Chief Complaint  Patient presents with  . Medical Management of Chronic Issues    HPI: Patient is 54 y.o. female with MS, HTN, depression, CHF, who is being seen for routine issues of MS, neurogenic bladder and depression.   Past Medical History  Diagnosis Date  . MS (multiple sclerosis) ([place])     a. Dx'd late 20's. b. Tx with Novantrone, Tysabri, Copaxone previously.  Marland Kitchen Hypertension   . Depression   . Glaucoma   . Cardiomyopathy ([place])     a.  Echo 04/29/12: Mild LVH, EF 20-25%, mild AI, moderate MR, moderate LAE, mild RAE, mild RVE, moderate TR, PASP 51, small pericardial effusion;   b. probably non-ischemic given multiple chemo-Tx agents used for MS and global LV dysfn on echo  . Chronic systolic heart failure ([place])   . CHF (congestive heart failure) ([place])   . Shortness of breath   . Neuromuscular disorder [facility])     MS    Past Surgical History  Procedure Laterality Date  . Ablation      uterine  . Cesarean section        Medication List       This list is accurate as of: 05/23/15 11:59 PM.  Always use your most recent med list.               amantadine 100 MG capsule  Commonly known as:  SYMMETREL  Take 100 mg by mouth daily.     baclofen 10 MG tablet  Commonly known as:  LIORESAL  Take 1 tablet (10 mg total) by mouth 3 (three) times daily.     carvedilol 12.5 MG tablet   Commonly known as:  COREG  Take 1 tablet (12.5 mg total) by mouth 2 (two) times daily with a meal.     docusate sodium 100 MG capsule  Commonly known as:  COLACE  Take 100 mg by mouth 2 (two) times daily.     DULoxetine 60 MG capsule  Commonly known as:  CYMBALTA  Take 60 mg by mouth daily.     furosemide 40 MG tablet  Commonly known as:  LASIX  Take 1 tablet (40 mg total) by mouth daily. Take 160 mg every morning and 80 mg po q HS     gabapentin 300 MG capsule  Commonly known as:  NEURONTIN  Take 3 capsules (900 mg total) by mouth 3 (three) times daily.     imipramine 50 MG tablet  Commonly known as:  TOFRANIL  Take 1.5 tablets (75 mg total) by mouth every evening. For depressive disorder     magnesium hydroxide 400 MG/5ML suspension  Commonly known as:  MILK OF MAGNESIA  Take 30 mLs by mouth every 8 (eight) hours as needed for mild constipation or moderate constipation.     multivitamin with minerals Tabs tablet  Take 1 tablet by mouth daily.     natalizumab 300 MG/15ML injection  Commonly known as:  TYSABRI  Inject 300 mg into the vein every 30 (thirty) days.     nitrofurantoin (macrocrystal-monohydrate) 100 MG capsule  Commonly known as:  MACROBID  Take 1 capsule (100 mg total) by mouth every 12 (twelve) hours. For 5 more days.     oxyCODONE-acetaminophen 7.5-325 MG tablet  Commonly known as:  PERCOCET  Take one tablet by mouth every 6 hours as needed for pain. Not to exceed 3gm APAP from all sources/24hr     phenazopyridine 100 MG tablet  Commonly known as:  PYRIDIUM  Take 100 mg by mouth 3 (three) times daily.     polyethylene glycol packet  Commonly known as:  MIRALAX / GLYCOLAX  Take 17 g by mouth 2 (two) times daily.     potassium chloride 20 MEQ packet  Commonly known as:  KLOR-CON  Take 20 mEq by mouth daily.     solifenacin 10 MG tablet  Commonly known as:  VESICARE  Take 10 mg by mouth daily. For bladder spasms     spironolactone 25 MG tablet   Commonly known as:  ALDACTONE  Take 25 mg by mouth daily. For CHF     VITAMIN B-12 PO  Take 1 tablet by mouth daily.     VITAMIN D-3 PO  Take 1 tablet by mouth daily.     XALATAN 0.005 % ophthalmic solution  Generic drug:  latanoprost  Place 1 drop into both eyes at bedtime.     zolpidem 10 MG tablet  Commonly known as:  AMBIEN  Take one tablet by mouth at bedtime for rest        No orders of the defined types were placed in this encounter.    Immunization History  Administered Date(s) Administered  . Influenza,inj,Quad PF,36+ Mos 08/01/2013  . Influenza-Unspecified 08/31/2014  . Pneumococcal Polysaccharide-23 08/01/2013  . Pneumococcal-Unspecified 08/30/2013, 07/29/2014    Social History  Substance Use Topics  . Smoking status: Former Research scientist (life sciences)  . Smokeless tobacco: Never Used  . Alcohol Use: No    Review of Systems  DATA OBTAINED: from patient, nurse GENERAL:  no fevers, fatigue, appetite changes SKIN: No itching, rash HEENT: No complaint RESPIRATORY: No cough, wheezing, SOB CARDIAC: No chest pain, palpitations, lower extremity edema  GI: No abdominal pain, No N/V/D or constipation, No heartburn or reflux  GU: No dysuria, frequency or urgency, some  Incontinence, some spasm  MUSCULOSKELETAL:some pain L side, esp L leg NEUROLOGIC: No headache, dizziness  PSYCHIATRIC: No overt anxiety or sadness  Filed Vitals:   05/23/15 1413  BP: 103/65  Pulse: 89  Temp: 97.5 F (36.4 C)  Resp: 18    Physical Exam  GENERAL APPEARANCE: Alert, conversant, No acute distress  SKIN: No diaphoresis rash HEENT: Unremarkable RESPIRATORY: Breathing is even, unlabored. Lung sounds are clear   CARDIOVASCULAR: Heart RRR no murmurs, rubs or gallops. No peripheral edema  GASTROINTESTINAL: Abdomen is soft, non-tender, not distended w/ normal bowel sounds.  GENITOURINARY: Bladder non tender, not distended  MUSCULOSKELETAL: contractures L and LE > UE NEUROLOGIC: Cranial nerves  2-12 grossly intact; L side weakness PSYCHIATRIC: Mood and affect appropriate to situation, no behavioral issues  Patient Active Problem List   Diagnosis Date Noted  . Chronic pain syndrome 03/03/2015  . Benign hypertensive heart disease without heart failure 01/18/2015  . Urinary tract infection due to Enterococcus   . Essential hypertension   . Abnormal LFTs   . Foot drop, bilateral   . Pressure ulcer 01/12/2015  .  UTI (lower urinary tract infection) 01/11/2015  . Hypovolemia 01/11/2015  . UTI (urinary tract infection), uncomplicated   . Cardiomyopathy ([place])   . Urinary incontinence 01/07/2015  . Chronic constipation 11/26/2014  . Depression 11/26/2014  . Tachycardia 02/20/2014  . Venous stasis ulcers ([place]) 02/20/2014  . Leg wound, left 11/04/2013  . FTT (failure to thrive) in adult 07/31/2013  . Medically noncompliant 07/31/2013  . Blisters of multiple sites 07/31/2013  . Generalized weakness 06/03/2013  . Insomnia 06/03/2013  . Neurogenic bladder 06/03/2013  . DS (disseminated sclerosis) ([place]) 02/12/2013  . Chronic diastolic heart failure ([place]) 09/29/2012  . Elevated CK 09/10/2012  . Severe malnutrition ([place]) 09/05/2012  . Paraplegia ([place]) 09/05/2012  . Fall 09/04/2012  . Chronic combined systolic and diastolic congestive heart failure ([place]) 05/22/2012  . Other primary cardiomyopathies 05/01/2012  . Immobility 04/28/2012  . OBESITY 11/30/2008  . ANEMIA, CHRONIC 11/30/2008  . MULTIPLE SCLEROSIS, PROGRESSIVE/RELAPSING 11/30/2008  . HYPERTENSION, BENIGN ESSENTIAL 11/30/2008  . ECZEMA 11/30/2008    CBC    Component Value Date/Time   WBC 6.2 05/18/2015 1626   WBC 6.3 01/14/2015 0912   RBC 3.55* 05/18/2015 1626   RBC 3.48* 01/14/2015 0912   RBC 3.70* 01/12/2015 2111   HGB 9.7* 01/14/2015 0912   HCT 29.6* 05/18/2015 1626   HCT 29.2* 01/14/2015 0912   PLT 245 01/14/2015 0912   MCV 83.9 01/14/2015 0912   LYMPHSABS 2.1 05/18/2015 1626   LYMPHSABS 0.7 01/14/2015  0912   MONOABS 0.5 01/14/2015 0912   EOSABS 0.2 01/14/2015 0912   BASOSABS 0.1 05/18/2015 1626   BASOSABS 0.0 01/14/2015 0912    CMP     Component Value Date/Time   NA 143 01/14/2015 0912   NA 142 09/28/2014   K 3.7 01/14/2015 0912   CL 110 01/14/2015 0912   CO2 26 01/14/2015 0912   GLUCOSE 111* 01/14/2015 0912   BUN 12 01/14/2015 0912   BUN 16 09/28/2014   CREATININE 0.92 01/14/2015 0912   CALCIUM 8.4* 01/14/2015 0912   PROT 5.4* 01/14/2015 0912   ALBUMIN 2.5* 01/14/2015 0912   AST 23 01/14/2015 0912   ALT 23 01/14/2015 0912   ALKPHOS 119 01/14/2015 0912   BILITOT 0.6 01/14/2015 0912   GFRNONAA >60 01/14/2015 0912   GFRAA >60 01/14/2015 0912    Assessment and Plan  MULTIPLE SCLEROSIS, PROGRESSIVE/RELAPSING Seen by neuro recently, she is on Tysabri infusions most recent 11/17 she is tolerating well; pt has c/o some pain L side, esp legs; there is not much to improve upon except add more for pain which I don't want to at this time; will inc baclofen to 20 mg TID  Neurogenic bladder Pt c/o still spasms and incontinence at times; flomax added to regimen; will f/u with urology as needed  Depression Situational and therefore won't be changing;plan - cont cymbalta and tofranil    Hennie Duos, MD

## 2015-05-23 NOTE — Assessment & Plan Note (Addendum)
Seen by neuro recently, she is on Tysabri infusions most recent 11/17 she is tolerating well; pt has c/o some pain L side, esp legs; there is not much to improve upon except add more for pain which I don't want to at this time; will inc baclofen to 20 mg TID

## 2015-05-28 NOTE — Assessment & Plan Note (Signed)
Pt c/o still spasms and incontinence at times; flomax added to regimen; will f/u with urology as needed

## 2015-05-28 NOTE — Assessment & Plan Note (Signed)
Situational and therefore won't be changing;plan - cont cymbalta and tofranil

## 2015-05-30 ENCOUNTER — Non-Acute Institutional Stay (SKILLED_NURSING_FACILITY): Payer: Medicare Other | Admitting: Internal Medicine

## 2015-05-30 ENCOUNTER — Encounter (HOSPITAL_COMMUNITY): Payer: Self-pay | Admitting: Nurse Practitioner

## 2015-05-30 ENCOUNTER — Inpatient Hospital Stay (HOSPITAL_COMMUNITY)
Admission: EM | Admit: 2015-05-30 | Discharge: 2015-06-04 | DRG: 871 | Disposition: A | Discharge status: Skilled Nursing Facility | Payer: Medicare Other | Source: Skilled Nursing Facility | Attending: Internal Medicine | Admitting: Internal Medicine

## 2015-05-30 ENCOUNTER — Emergency Department (HOSPITAL_COMMUNITY): Payer: Medicare Other

## 2015-05-30 DIAGNOSIS — Z6835 Body mass index (BMI) 35.0-35.9, adult: Secondary | ICD-10-CM | POA: Diagnosis not present

## 2015-05-30 DIAGNOSIS — N390 Urinary tract infection, site not specified: Secondary | ICD-10-CM | POA: Diagnosis present

## 2015-05-30 DIAGNOSIS — D638 Anemia in other chronic diseases classified elsewhere: Secondary | ICD-10-CM | POA: Diagnosis present

## 2015-05-30 DIAGNOSIS — R6520 Severe sepsis without septic shock: Secondary | ICD-10-CM | POA: Diagnosis present

## 2015-05-30 DIAGNOSIS — G894 Chronic pain syndrome: Secondary | ICD-10-CM | POA: Diagnosis present

## 2015-05-30 DIAGNOSIS — N179 Acute kidney failure, unspecified: Secondary | ICD-10-CM | POA: Diagnosis present

## 2015-05-30 DIAGNOSIS — D649 Anemia, unspecified: Secondary | ICD-10-CM | POA: Diagnosis not present

## 2015-05-30 DIAGNOSIS — E860 Dehydration: Secondary | ICD-10-CM | POA: Diagnosis present

## 2015-05-30 DIAGNOSIS — R4182 Altered mental status, unspecified: Secondary | ICD-10-CM

## 2015-05-30 DIAGNOSIS — G9340 Encephalopathy, unspecified: Secondary | ICD-10-CM | POA: Diagnosis not present

## 2015-05-30 DIAGNOSIS — E46 Unspecified protein-calorie malnutrition: Secondary | ICD-10-CM | POA: Diagnosis present

## 2015-05-30 DIAGNOSIS — E876 Hypokalemia: Secondary | ICD-10-CM | POA: Diagnosis present

## 2015-05-30 DIAGNOSIS — G35 Multiple sclerosis: Secondary | ICD-10-CM | POA: Diagnosis present

## 2015-05-30 DIAGNOSIS — F329 Major depressive disorder, single episode, unspecified: Secondary | ICD-10-CM | POA: Diagnosis present

## 2015-05-30 DIAGNOSIS — E870 Hyperosmolality and hypernatremia: Secondary | ICD-10-CM | POA: Diagnosis present

## 2015-05-30 DIAGNOSIS — I5022 Chronic systolic (congestive) heart failure: Secondary | ICD-10-CM | POA: Diagnosis present

## 2015-05-30 DIAGNOSIS — I428 Other cardiomyopathies: Secondary | ICD-10-CM | POA: Diagnosis present

## 2015-05-30 DIAGNOSIS — G9341 Metabolic encephalopathy: Secondary | ICD-10-CM | POA: Diagnosis present

## 2015-05-30 DIAGNOSIS — Z87891 Personal history of nicotine dependence: Secondary | ICD-10-CM

## 2015-05-30 DIAGNOSIS — F32A Depression, unspecified: Secondary | ICD-10-CM | POA: Diagnosis present

## 2015-05-30 DIAGNOSIS — I10 Essential (primary) hypertension: Secondary | ICD-10-CM | POA: Diagnosis not present

## 2015-05-30 DIAGNOSIS — I110 Hypertensive heart disease with heart failure: Secondary | ICD-10-CM | POA: Diagnosis present

## 2015-05-30 DIAGNOSIS — R69 Illness, unspecified: Secondary | ICD-10-CM

## 2015-05-30 DIAGNOSIS — A419 Sepsis, unspecified organism: Principal | ICD-10-CM | POA: Diagnosis present

## 2015-05-30 LAB — CBC
HCT: 32.5 % — ABNORMAL LOW (ref 36.0–46.0)
Hemoglobin: 10.7 g/dL — ABNORMAL LOW (ref 12.0–15.0)
MCH: 27.8 pg (ref 26.0–34.0)
MCHC: 32.9 g/dL (ref 30.0–36.0)
MCV: 84.4 fL (ref 78.0–100.0)
PLATELETS: 268 10*3/uL (ref 150–400)
RBC: 3.85 MIL/uL — AB (ref 3.87–5.11)
RDW: 16.2 % — ABNORMAL HIGH (ref 11.5–15.5)
WBC: 7 10*3/uL (ref 4.0–10.5)

## 2015-05-30 LAB — COMPREHENSIVE METABOLIC PANEL
ALK PHOS: 135 U/L — AB (ref 38–126)
ALT: 14 U/L (ref 14–54)
ALT: 13 U/L — ABNORMAL LOW (ref 14–54)
ANION GAP: 11 (ref 5–15)
AST: 15 U/L (ref 15–41)
AST: 18 U/L (ref 15–41)
Albumin: 3.9 g/dL (ref 3.5–5.0)
Albumin: 4 g/dL (ref 3.5–5.0)
Alkaline Phosphatase: 140 U/L — ABNORMAL HIGH (ref 38–126)
Anion gap: 11 (ref 5–15)
BILIRUBIN TOTAL: 0.6 mg/dL (ref 0.3–1.2)
BUN: 17 mg/dL (ref 6–20)
BUN: 18 mg/dL (ref 6–20)
CALCIUM: 9.7 mg/dL (ref 8.9–10.3)
CHLORIDE: 104 mmol/L (ref 101–111)
CO2: 31 mmol/L (ref 22–32)
CO2: 31 mmol/L (ref 22–32)
CREATININE: 1.1 mg/dL — AB (ref 0.44–1.00)
Calcium: 9.4 mg/dL (ref 8.9–10.3)
Chloride: 105 mmol/L (ref 101–111)
Creatinine, Ser: 1.06 mg/dL — ABNORMAL HIGH (ref 0.44–1.00)
GFR calc Af Amer: >60 mL/min (ref >60)
GFR, EST NON AFRICAN AMERICAN: 57 mL/min — AB (ref >60)
GFR, EST NON AFRICAN AMERICAN: 59 mL/min — AB (ref >60)
Glucose, Bld: 102 mg/dL — ABNORMAL HIGH (ref 65–99)
Glucose, Bld: 101 mg/dL — ABNORMAL HIGH (ref 65–99)
POTASSIUM: 3.6 mmol/L (ref 3.5–5.1)
POTASSIUM: 3.8 mmol/L (ref 3.5–5.1)
Sodium: 146 mmol/L — ABNORMAL HIGH (ref 135–145)
Sodium: 147 mmol/L — ABNORMAL HIGH (ref 135–145)
TOTAL PROTEIN: 7.3 g/dL (ref 6.5–8.1)
Total Bilirubin: 0.7 mg/dL (ref 0.3–1.2)
Total Protein: 7 g/dL (ref 6.5–8.1)

## 2015-05-30 LAB — CBG MONITORING, ED: Glucose-Capillary: 101 mg/dL — ABNORMAL HIGH (ref 65–99)

## 2015-05-30 LAB — URINALYSIS, ROUTINE W REFLEX MICROSCOPIC
Bilirubin Urine: NEGATIVE
Glucose, UA: NEGATIVE mg/dL
KETONES UR: NEGATIVE mg/dL
NITRITE: POSITIVE — AB
PROTEIN: NEGATIVE mg/dL
Specific Gravity, Urine: 1.011 (ref 1.005–1.030)
pH: 7 (ref 5.0–8.0)

## 2015-05-30 LAB — CBC WITH DIFFERENTIAL/PLATELET
BASOS ABS: 0.1 10*3/uL (ref 0.0–0.1)
BASOS PCT: 1 %
EOS PCT: 5 %
Eosinophils Absolute: 0.3 10*3/uL (ref 0.0–0.7)
HCT: 33.5 % — ABNORMAL LOW (ref 36.0–46.0)
Hemoglobin: 10.9 g/dL — ABNORMAL LOW (ref 12.0–15.0)
LYMPHS PCT: 30 %
Lymphs Abs: 2.3 10*3/uL (ref 0.7–4.0)
MCH: 27.5 pg (ref 26.0–34.0)
MCHC: 32.5 g/dL (ref 30.0–36.0)
MCV: 84.6 fL (ref 78.0–100.0)
MONO ABS: 0.6 10*3/uL (ref 0.1–1.0)
Monocytes Relative: 8 %
Neutro Abs: 4.3 10*3/uL (ref 1.7–7.7)
Neutrophils Relative %: 56 %
PLATELETS: 290 10*3/uL (ref 150–400)
RBC: 3.96 MIL/uL (ref 3.87–5.11)
RDW: 16.1 % — AB (ref 11.5–15.5)
WBC: 7.6 10*3/uL (ref 4.0–10.5)

## 2015-05-30 LAB — URINE MICROSCOPIC-ADD ON

## 2015-05-30 LAB — I-STAT CG4 LACTIC ACID, ED: LACTIC ACID, VENOUS: 1.03 mmol/L (ref 0.5–2.0)

## 2015-05-30 MED ORDER — VANCOMYCIN HCL IN DEXTROSE 1-5 GM/200ML-% IV SOLN
1000.0000 mg | Freq: Two times a day (BID) | INTRAVENOUS | Status: DC
  Administered 2015-05-31: 1000 mg via INTRAVENOUS
  Filled 2015-05-30: qty 200

## 2015-05-30 MED ORDER — FLUCONAZOLE 150 MG PO TABS
150.0000 mg | ORAL_TABLET | Freq: Once | ORAL | Status: DC
  Filled 2015-05-30: qty 1

## 2015-05-30 MED ORDER — LEVOFLOXACIN IN D5W 750 MG/150ML IV SOLN
750.0000 mg | Freq: Once | INTRAVENOUS | Status: AC
  Administered 2015-05-30: 750 mg via INTRAVENOUS
  Filled 2015-05-30: qty 150

## 2015-05-30 MED ORDER — NYSTATIN 100000 UNIT/GM EX CREA: 1.0000 "application " | TOPICAL_CREAM | Freq: Two times a day (BID) | CUTANEOUS | Status: DC

## 2015-05-30 NOTE — ED Provider Notes (Signed)
CSN: [id number]     Arrival date & time 05/30/15  1924 History   First MD Initiated Contact with Patient 05/30/15 2028     Chief Complaint  Patient presents with  . Altered Mental Status     (Consider location/radiation/quality/duration/timing/severity/associated sxs/prior Treatment) HPI For 2 days the patient reportedly has not been at her baseline. She has had decreased mental status. At baseline the patient is verbally interactive. She is significantly disabled by advanced MS. She is nonambulatory with multiple medical comorbidities. The patient's sister reports that the last time she had a similar episode, she had a urinary tract infection. Fever of 101 is reported from EMS. The patient cannot provide any additional history. Past Medical History  Diagnosis Date  . MS (multiple sclerosis) ([place])     a. Dx'd late 20's. b. Tx with Novantrone, Tysabri, Copaxone previously.  Marland Kitchen Hypertension   . Depression   . Glaucoma   . Cardiomyopathy ([place])     a.  Echo 04/29/12: Mild LVH, EF 20-25%, mild AI, moderate MR, moderate LAE, mild RAE, mild RVE, moderate TR, PASP 51, small pericardial effusion;   b. probably non-ischemic given multiple chemo-Tx agents used for MS and global LV dysfn on echo  . Chronic systolic heart failure ([place])   . CHF (congestive heart failure) ([place])   . Shortness of breath   . Neuromuscular disorder [facility])     MS   Past Surgical History  Procedure Laterality Date  . Ablation      uterine  . Cesarean section     Family History  Problem Relation Age of Onset  . Hypertension Mother   . Heart attack Neg Hx   . Cancer Mother     breast   . Cancer Father     prostate  . Multiple sclerosis Sister    Social History  Substance Use Topics  . Smoking status: Former Research scientist (life sciences)  . Smokeless tobacco: Never Used  . Alcohol Use: No   OB History    No data available     Review of Systems  Patient cannot provide review of systems level V caveat  Allergies  Sulfonamide  derivatives and Penicillins  Home Medications   Prior to Admission medications   Medication Sig Start Date End Date Taking? Authorizing Provider  amantadine (SYMMETREL) 100 MG capsule Take 100 mg by mouth daily.    Yes Historical Provider, MD  baclofen (LIORESAL) 10 MG tablet Take 1 tablet (10 mg total) by mouth 3 (three) times daily. 09/27/14  Yes Pieter Partridge, DO  carvedilol (COREG) 12.5 MG tablet Take 1 tablet (12.5 mg total) by mouth 2 (two) times daily with a meal. 01/16/15  Yes Bonnielee Haff, MD  Cholecalciferol (VITAMIN D-3 PO) Take 1 tablet by mouth daily.   Yes Historical Provider, MD  Cyanocobalamin (VITAMIN B-12 PO) Take 1 tablet by mouth daily.   Yes Historical Provider, MD  docusate sodium (COLACE) 100 MG capsule Take 100 mg by mouth 2 (two) times daily.   Yes Historical Provider, MD  DULoxetine (CYMBALTA) 60 MG capsule Take 60 mg by mouth daily.   Yes Historical Provider, MD  furosemide (LASIX) 40 MG tablet Take 1 tablet (40 mg total) by mouth daily. Take 160 mg every morning and 80 mg po q HS 01/16/15  Yes Bonnielee Haff, MD  gabapentin (NEURONTIN) 300 MG capsule Take 3 capsules (900 mg total) by mouth 3 (three) times daily. 09/27/14  Yes Adam Telford Nab, DO  imipramine (TOFRANIL) 50 MG  tablet Take 1.5 tablets (75 mg total) by mouth every evening. For depressive disorder 01/05/15  Yes Gerlene Fee, NP  latanoprost (XALATAN) 0.005 % ophthalmic solution Place 1 drop into both eyes at bedtime.   Yes Historical Provider, MD  magnesium hydroxide (MILK OF MAGNESIA) 400 MG/5ML suspension Take 30 mLs by mouth every 8 (eight) hours as needed for mild constipation or moderate constipation.   Yes Historical Provider, MD  Multiple Vitamin (MULTIVITAMIN WITH MINERALS) TABS tablet Take 1 tablet by mouth daily.   Yes Historical Provider, MD  natalizumab (TYSABRI) 300 MG/15ML injection Inject 300 mg into the vein every 30 (thirty) days.   Yes Historical Provider, MD  oxyCODONE-acetaminophen (PERCOCET)  7.5-325 MG per tablet Take one tablet by mouth every 6 hours as needed for pain. Not to exceed 3gm APAP from all sources/24hr 01/18/15  Yes Estill Dooms, MD  phenazopyridine (PYRIDIUM) 100 MG tablet Take 100 mg by mouth 3 (three) times daily.    Yes Historical Provider, MD  polyethylene glycol (MIRALAX / GLYCOLAX) packet Take 17 g by mouth 2 (two) times daily. Patient taking differently: Take 17 g by mouth every 8 (eight) hours as needed for mild constipation.  01/05/15  Yes Gerlene Fee, NP  potassium chloride (KLOR-CON) 20 MEQ packet Take 20 mEq by mouth daily. 01/16/15  Yes Bonnielee Haff, MD  solifenacin (VESICARE) 10 MG tablet Take 10 mg by mouth daily. For bladder spasms   Yes Historical Provider, MD  spironolactone (ALDACTONE) 25 MG tablet Take 25 mg by mouth daily. For CHF   Yes Historical Provider, MD  zolpidem (AMBIEN) 10 MG tablet Take one tablet by mouth at bedtime for rest 01/18/15  Yes Estill Dooms, MD  nitrofurantoin, macrocrystal-monohydrate, (MACROBID) 100 MG capsule Take 1 capsule (100 mg total) by mouth every 12 (twelve) hours. For 5 more days. 01/16/15   Bonnielee Haff, MD   BP 118/52 mmHg  Pulse 84  Temp(Src) 98.3 F (36.8 C) (Rectal)  Resp 23  SpO2 95% Physical Exam  Constitutional:  Patient is awake and alert without apparent restaurant stress. She does appear significantly deconditioned and is lying in the stretcher unable to follow any commands. She does appear well maintained.  HENT:  Head: Normocephalic and atraumatic.  Mucous membranes slightly dry.  Eyes: EOM are normal. Pupils are equal, round, and reactive to light.  Cardiovascular: Normal rate, regular rhythm, normal heart sounds and intact distal pulses.   Pulmonary/Chest: Effort normal and breath sounds normal.  Abdominal:  Patient's abdomen seems distended and firm. She does not express pain with palpation. Condition of the abdominal skin is good. There is no rash or maceration within the inguinal skin  folds.  Musculoskeletal:  Patient has extension contractures of bilateral lower extremities. There is muscle atrophy. Skin condition is good without significant edema or any cellulitis. The patient has flexion contractures of her upper extremities. Not appear to have any significant use.  Neurological:  Patient is awake and having eye contact. She can make very minimal vocalization response to my questions. Based on flexion contractures of the upper extremities and extension contractures lower extremity is patient has significant neurologic disability. She will follow commands to take deep inspiration.  Skin: Skin is warm and dry.    ED Course  Procedures (including critical care time) Labs Review Labs Reviewed  COMPREHENSIVE METABOLIC PANEL - Abnormal; Notable for the following:    Sodium 146 (*)    Glucose, Bld 102 (*)    Creatinine,  Ser 1.10 (*)    Alkaline Phosphatase 140 (*)    GFR calc non Af Amer 57 (*)    All other components within normal limits  CBC - Abnormal; Notable for the following:    RBC 3.85 (*)    Hemoglobin 10.7 (*)    HCT 32.5 (*)    RDW 16.2 (*)    All other components within normal limits  COMPREHENSIVE METABOLIC PANEL - Abnormal; Notable for the following:    Sodium 147 (*)    Glucose, Bld 101 (*)    Creatinine, Ser 1.06 (*)    ALT 13 (*)    Alkaline Phosphatase 135 (*)    GFR calc non Af Amer 59 (*)    All other components within normal limits  CBC WITH DIFFERENTIAL/PLATELET - Abnormal; Notable for the following:    Hemoglobin 10.9 (*)    HCT 33.5 (*)    RDW 16.1 (*)    All other components within normal limits  URINALYSIS, ROUTINE W REFLEX MICROSCOPIC (NOT AT [facility]) - Abnormal; Notable for the following:    Color, Urine ORANGE (*)    APPearance TURBID (*)    Hgb urine dipstick MODERATE (*)    Nitrite POSITIVE (*)    Leukocytes, UA LARGE (*)    All other components within normal limits  URINE MICROSCOPIC-ADD ON - Abnormal; Notable for the  following:    Squamous Epithelial / LPF FIELD OBSCURED BY WBC'S (*)    Bacteria, UA FIELD OBSCURED BY WBC'S (*)    All other components within normal limits  CBG MONITORING, ED - Abnormal; Notable for the following:    Glucose-Capillary 101 (*)    All other components within normal limits  CULTURE, BLOOD (ROUTINE X 2)  CULTURE, BLOOD (ROUTINE X 2)  URINE CULTURE  I-STAT CG4 LACTIC ACID, ED    Imaging Review Dg Chest Port 1 View  05/30/2015  CLINICAL DATA:  Lethargy and altered mental status, 2 days duration. Febrile. EXAM: PORTABLE CHEST 1 VIEW COMPARISON:  01/10/2015 FINDINGS: A single AP portable view of the chest demonstrates no focal airspace consolidation or alveolar edema. The lungs are grossly clear. There is no large effusion or pneumothorax. Cardiac and mediastinal contours appear unremarkable. IMPRESSION: No active disease. Electronically Signed   By: Andreas Newport M.D.   On: 05/30/2015 21:35   I have personally reviewed and evaluated these images and lab results as part of my medical decision-making.   EKG Interpretation None      MDM   Final diagnoses:  UTI (lower urinary tract infection)  Altered mental status, unspecified altered mental status type  Severe comorbid illness       Charlesetta Shanks, MD 05/30/15 2240

## 2015-05-30 NOTE — ED Notes (Signed)
Dr. Johnney Killian informed of possible sepsis. She requested that we enter a possible sepsis order set.

## 2015-05-30 NOTE — H&P (Signed)
[facility] History and Physical  Shannon Garrett [id number] DOB: January 09, 1961 DOA: 05/30/2015  Referring physician: Charlesetta Shanks, MD PCP: Shannon Cranker, DO   Chief Complaint: Altered Mental Status.  HPI: Shannon Garrett is a 54 y.o. female with a past medical history of advanced multiple sclerosis, hypertension, depression, glaucoma, systolic CHF, chronic pain syndrome who is brought to the emergency department via EMS from [facility] due to altered mental status and lethargy that began 2 days ago. At baseline, the patient is usually nonambulatory due to lower extremity weakness, but awake alert oriented 3 and able to talk and interact with the facilities personnel and relatives. Per patient's sister, who was at bedside earlier, she states that the patient has the symptoms when she gets and UTI. Workup in the emergency department is consistent with sepsis due to UTI.  When seen, the patient was only oriented to name and unable to provide further history.  Review of Systems:  Unable to obtain due to the patient's mental status.   Past Medical History  Diagnosis Date  . MS (multiple sclerosis) ([place])     a. Dx'd late 20's. b. Tx with Novantrone, Tysabri, Copaxone previously.  Marland Kitchen Hypertension   . Depression   . Glaucoma   . Cardiomyopathy ([place])     a.  Echo 04/29/12: Mild LVH, EF 20-25%, mild AI, moderate MR, moderate LAE, mild RAE, mild RVE, moderate TR, PASP 51, small pericardial effusion;   b. probably non-ischemic given multiple chemo-Tx agents used for MS and global LV dysfn on echo  . Chronic systolic heart failure ([place])   . CHF (congestive heart failure) ([place])   . Shortness of breath   . Neuromuscular disorder [facility])     MS   Past Surgical History  Procedure Laterality Date  . Ablation      uterine  . Cesarean section     Social History:  reports that she has quit smoking. She has never used smokeless tobacco. She reports that she does not drink alcohol  or use illicit drugs.  Allergies  Allergen Reactions  . Sulfonamide Derivatives Hives and Shortness Of Breath  . Penicillins Hives and Itching    Family History  Problem Relation Age of Onset  . Hypertension Mother   . Heart attack Neg Hx   . Cancer Mother     breast   . Cancer Father     prostate  . Multiple sclerosis Sister     Prior to Admission medications   Medication Sig Start Date End Date Taking? Authorizing Provider  amantadine (SYMMETREL) 100 MG capsule Take 100 mg by mouth daily.    Yes Historical Provider, MD  baclofen (LIORESAL) 10 MG tablet Take 1 tablet (10 mg total) by mouth 3 (three) times daily. 09/27/14  Yes Shannon Partridge, DO  carvedilol (COREG) 12.5 MG tablet Take 1 tablet (12.5 mg total) by mouth 2 (two) times daily with a meal. 01/16/15  Yes Shannon Haff, MD  Cholecalciferol (VITAMIN D-3 PO) Take 1 tablet by mouth daily.   Yes Historical Provider, MD  Cyanocobalamin (VITAMIN B-12 PO) Take 1 tablet by mouth daily.   Yes Historical Provider, MD  docusate sodium (COLACE) 100 MG capsule Take 100 mg by mouth 2 (two) times daily.   Yes Historical Provider, MD  DULoxetine (CYMBALTA) 60 MG capsule Take 60 mg by mouth daily.   Yes Historical Provider, MD  furosemide (LASIX) 40 MG tablet Take 1 tablet (40 mg total) by mouth daily. Take 160 mg  every morning and 80 mg po q HS 01/16/15  Yes Shannon Haff, MD  gabapentin (NEURONTIN) 300 MG capsule Take 3 capsules (900 mg total) by mouth 3 (three) times daily. 09/27/14  Yes Shannon Telford Nab, DO  imipramine (TOFRANIL) 50 MG tablet Take 1.5 tablets (75 mg total) by mouth every evening. For depressive disorder 01/05/15  Yes Shannon Fee, NP  latanoprost (XALATAN) 0.005 % ophthalmic solution Place 1 drop into both eyes at bedtime.   Yes Historical Provider, MD  magnesium hydroxide (MILK OF MAGNESIA) 400 MG/5ML suspension Take 30 mLs by mouth every 8 (eight) hours as needed for mild constipation or moderate constipation.   Yes Historical  Provider, MD  Multiple Vitamin (MULTIVITAMIN WITH MINERALS) TABS tablet Take 1 tablet by mouth daily.   Yes Historical Provider, MD  natalizumab (TYSABRI) 300 MG/15ML injection Inject 300 mg into the vein every 30 (thirty) days.   Yes Historical Provider, MD  oxyCODONE-acetaminophen (PERCOCET) 7.5-325 MG per tablet Take one tablet by mouth every 6 hours as needed for pain. Not to exceed 3gm APAP from all sources/24hr 01/18/15  Yes Shannon Dooms, MD  phenazopyridine (PYRIDIUM) 100 MG tablet Take 100 mg by mouth 3 (three) times daily.    Yes Historical Provider, MD  polyethylene glycol (MIRALAX / GLYCOLAX) packet Take 17 g by mouth 2 (two) times daily. Patient taking differently: Take 17 g by mouth every 8 (eight) hours as needed for mild constipation.  01/05/15  Yes Shannon Fee, NP  potassium chloride (KLOR-CON) 20 MEQ packet Take 20 mEq by mouth daily. 01/16/15  Yes Shannon Haff, MD  solifenacin (VESICARE) 10 MG tablet Take 10 mg by mouth daily. For bladder spasms   Yes Historical Provider, MD  spironolactone (ALDACTONE) 25 MG tablet Take 25 mg by mouth daily. For CHF   Yes Historical Provider, MD  zolpidem (AMBIEN) 10 MG tablet Take one tablet by mouth at bedtime for rest 01/18/15  Yes Shannon Dooms, MD  nitrofurantoin, macrocrystal-monohydrate, (MACROBID) 100 MG capsule Take 1 capsule (100 mg total) by mouth every 12 (twelve) hours. For 5 more days. 01/16/15   Shannon Haff, MD   Physical Exam: Filed Vitals:   05/30/15 1936 05/30/15 1942 05/30/15 2125  BP:  106/71 118/52  Pulse:  84 84  Temp:   98.3 F (36.8 C)  TempSrc:   Rectal  Resp:  26 23  SpO2: 96% 98% 95%    Wt Readings from Last 3 Encounters:  05/18/15 103.42 kg (228 lb)  04/21/15 103.42 kg (228 lb)  03/31/15 98.431 kg (217 lb)    General:  Disoriented and looks acutely ill. Eyes: PERRL, normal lids, irises & conjunctiva ENT: grossly normal hearing, lips and oral mucosae are dry. Neck: no LAD, masses or  thyromegaly Cardiovascular: RRR, no m/r/g. No LE edema. Telemetry: SR, no arrhythmias  Respiratory: CTA bilaterally, no w/r/r. Decreased respiratory effort. Abdomen: soft, ntnd Skin:  Musculoskeletal: Increased muscle tone and atrophy BLE/BUE. Psychiatric: Confused. Neurologic: Only oriented to name. Flexion contractures in upper extremities and extension contractures on lower extremities. Unable to evaluate due to confusion.           Labs on Admission:  Basic Metabolic Panel:  Recent Labs Lab 05/30/15 2024 05/30/15 2140  NA 146* 147*  K 3.6 3.8  CL 104 105  CO2 31 31  GLUCOSE 102* 101*  BUN 17 18  CREATININE 1.10* 1.06*  CALCIUM 9.4 9.7   Liver Function Tests:  Recent Labs Lab 05/30/15  2024 05/30/15 2140  AST 15 18  ALT 14 13*  ALKPHOS 140* 135*  BILITOT 0.7 0.6  PROT 7.0 7.3  ALBUMIN 3.9 4.0   CBC:  Recent Labs Lab 05/30/15 2024 05/30/15 2140  WBC 7.0 7.6  NEUTROABS  --  4.3  HGB 10.7* 10.9*  HCT 32.5* 33.5*  MCV 84.4 84.6  PLT 268 290    CBG:  Recent Labs Lab 05/30/15 1950  GLUCAP 101*    Radiological Exams on Admission: Dg Chest Port 1 View  05/30/2015  CLINICAL DATA:  Lethargy and altered mental status, 2 days duration. Febrile. EXAM: PORTABLE CHEST 1 VIEW COMPARISON:  01/10/2015 FINDINGS: A single AP portable view of the chest demonstrates no focal airspace consolidation or alveolar edema. The lungs are grossly clear. There is no large effusion or pneumothorax. Cardiac and mediastinal contours appear unremarkable. IMPRESSION: No active disease. Electronically Signed   By: Andreas Newport M.D.   On: 05/30/2015 21:35   LV EF: 60% -  65%  ------------------------------------------------------------------- Indications:   425.4 Other primary Cardiomyopathies.  ------------------------------------------------------------------- History:  PMH: Multiple Sclerosis. Dyspnea. Congestive heart failure. Risk factors:  Hypertension.  ------------------------------------------------------------------- Study Conclusions  - Left ventricle: The cavity size was normal. There was mild concentric hypertrophy. Systolic function was normal. The estimated ejection fraction was in the range of 60% to 65%. Wall motion was normal; there were no regional wall motion abnormalities. Doppler parameters are consistent with abnormal left ventricular relaxation (grade 1 diastolic dysfunction). - Aortic valve: There was mild regurgitation. - Mitral valve: Calcified annulus. There was mild regurgitation.  Impressions:  - When compared to prior echocardiogram, EF has increased.    Assessment/Plan Principal Problem:   Sepsis secondary to UTI ([place]) Admit to a stepdown. Continue IV hydration. Continue IV Levaquin to cover for gram negatives. Start vancomycin since the patient has had previous UTIs secondary to enterococcus.  Active Problems:   MULTIPLE SCLEROSIS, PROGRESSIVE/RELAPSING Continue monthly injections as per neurology. Resume analgesics and muscle relaxants once the patient is more alert.    Depression Resume Cymbalta and imipramine once the patient is alert enough to swallow pills.    Essential hypertension Monitor blood pressure closely. Resume carvedilol 12.5 mg by mouth twice a day and spironolactone 25 mg by mouth daily once the patient is more alert. Metoprolol IV when necessary for hypertension or tachycardia until the patient's mental status improves.    Chronic pain syndrome Resume analgesics orally once the patient is alert enough to swallow.    Mental status alteration Supportive care, neuro checks and treatment of sepsis.    Hypernatremia   Dehydration Continue IV hydration with 0.45% sodium chloride solution. Follow-up sodium level in the morning.    Chronic anemia Follow-up hematocrit and hemoglobin periodically.   Code Status: Full code. DVT Prophylaxis: Lovenox  SQ. Family Communication: Disposition Plan: Admit to stepdown for IV antibiotic therapy and close monitoring.  Time spent: Over 70 minutes were spent during the process of this admission.  Reubin Milan [facility] Pager [phone].

## 2015-05-30 NOTE — ED Notes (Signed)
INITIAL ASSESSMENT COMPLETED. PT RECEIVED VIA EMS FOR LETHARGY X2 DAYS. PER THE NURSING FACILITY. PT NON-VERBAL, BUT RESPONDS TO PAIN. SISTER AT THE BEDSIDE, AND STATES THAT THE LAST TIME THE PT WAS IN THIS STATE, SHE HAD A UTI. AWAITING FURTHER ORDERS.

## 2015-05-30 NOTE — ED Notes (Signed)
Patient arrives to [facility] ED with [facility] from [place] where she resides. Staff referred to ED for complaints of altered mental status and lethargy that began 2 days ago.

## 2015-05-30 NOTE — ED Notes (Signed)
Bed: WA07 Expected date:  Expected time:  Means of arrival:  Comments: 54 yo F  Altered LOC

## 2015-05-31 ENCOUNTER — Encounter: Payer: Self-pay | Admitting: Internal Medicine

## 2015-05-31 DIAGNOSIS — D649 Anemia, unspecified: Secondary | ICD-10-CM

## 2015-05-31 DIAGNOSIS — F329 Major depressive disorder, single episode, unspecified: Secondary | ICD-10-CM

## 2015-05-31 LAB — COMPREHENSIVE METABOLIC PANEL
ALT: 14 U/L (ref 14–54)
AST: 15 U/L (ref 15–41)
Albumin: 3.9 g/dL (ref 3.5–5.0)
Alkaline Phosphatase: 147 U/L — ABNORMAL HIGH (ref 38–126)
Anion gap: 7 (ref 5–15)
BUN: 18 mg/dL (ref 6–20)
CHLORIDE: 103 mmol/L (ref 101–111)
CO2: 35 mmol/L — AB (ref 22–32)
Calcium: 9.6 mg/dL (ref 8.9–10.3)
Creatinine, Ser: 1.28 mg/dL — ABNORMAL HIGH (ref 0.44–1.00)
GFR, EST AFRICAN AMERICAN: 55 mL/min — AB (ref >60)
GFR, EST NON AFRICAN AMERICAN: 47 mL/min — AB (ref >60)
Glucose, Bld: 119 mg/dL — ABNORMAL HIGH (ref 65–99)
POTASSIUM: 3.4 mmol/L — AB (ref 3.5–5.1)
SODIUM: 145 mmol/L (ref 135–145)
Total Bilirubin: 0.7 mg/dL (ref 0.3–1.2)
Total Protein: 7.5 g/dL (ref 6.5–8.1)

## 2015-05-31 LAB — CBC WITH DIFFERENTIAL/PLATELET
Basophils Absolute: 0.1 10*3/uL (ref 0.0–0.1)
Basophils Relative: 1 %
Eosinophils Absolute: 0.3 10*3/uL (ref 0.0–0.7)
Eosinophils Relative: 4 %
HCT: 32.9 % — ABNORMAL LOW (ref 36.0–46.0)
HEMOGLOBIN: 10.9 g/dL — AB (ref 12.0–15.0)
LYMPHS ABS: 2 10*3/uL (ref 0.7–4.0)
LYMPHS PCT: 28 %
MCH: 28.7 pg (ref 26.0–34.0)
MCHC: 33.1 g/dL (ref 30.0–36.0)
MCV: 86.6 fL (ref 78.0–100.0)
Monocytes Absolute: 0.8 10*3/uL (ref 0.1–1.0)
Monocytes Relative: 11 %
NEUTROS PCT: 56 %
Neutro Abs: 4 10*3/uL (ref 1.7–7.7)
Platelets: 275 10*3/uL (ref 150–400)
RBC: 3.8 MIL/uL — AB (ref 3.87–5.11)
RDW: 16.5 % — ABNORMAL HIGH (ref 11.5–15.5)
WBC: 7.2 10*3/uL (ref 4.0–10.5)

## 2015-05-31 LAB — MRSA PCR SCREENING: MRSA by PCR: NEGATIVE

## 2015-05-31 LAB — I-STAT CG4 LACTIC ACID, ED: LACTIC ACID, VENOUS: 0.66 mmol/L (ref 0.5–2.0)

## 2015-05-31 MED ORDER — ADULT MULTIVITAMIN W/MINERALS CH
1.0000 | ORAL_TABLET | Freq: Every day | ORAL | Status: DC
  Administered 2015-05-31 – 2015-06-04 (×5): 1 via ORAL
  Filled 2015-05-31 (×6): qty 1

## 2015-05-31 MED ORDER — ENSURE ENLIVE PO LIQD
237.0000 mL | Freq: Two times a day (BID) | ORAL | Status: DC
  Administered 2015-05-31 – 2015-06-03 (×5): 237 mL via ORAL

## 2015-05-31 MED ORDER — LEVOFLOXACIN IN D5W 750 MG/150ML IV SOLN: 750.0000 mg | INTRAVENOUS | Status: DC

## 2015-05-31 MED ORDER — ACETAMINOPHEN 325 MG PO TABS
650.0000 mg | ORAL_TABLET | Freq: Four times a day (QID) | ORAL | Status: DC | PRN
  Administered 2015-05-31 – 2015-06-01 (×2): 650 mg via ORAL
  Filled 2015-05-31 (×2): qty 2

## 2015-05-31 MED ORDER — VANCOMYCIN HCL 10 G IV SOLR
1250.0000 mg | Freq: Two times a day (BID) | INTRAVENOUS | Status: DC
  Administered 2015-05-31: 1250 mg via INTRAVENOUS
  Filled 2015-05-31: qty 1250

## 2015-05-31 MED ORDER — ACETAMINOPHEN 650 MG RE SUPP: 650.0000 mg | Freq: Four times a day (QID) | RECTAL | Status: DC | PRN

## 2015-05-31 MED ORDER — SODIUM CHLORIDE 0.9 % IV SOLN
INTRAVENOUS | Status: DC
  Administered 2015-05-31 – 2015-06-01 (×4): via INTRAVENOUS
  Filled 2015-05-31: qty 1000

## 2015-05-31 MED ORDER — METOPROLOL TARTRATE 1 MG/ML IV SOLN
5.0000 mg | Freq: Four times a day (QID) | INTRAVENOUS | Status: DC | PRN
  Administered 2015-06-01: 5 mg via INTRAVENOUS
  Filled 2015-05-31: qty 5

## 2015-05-31 MED ORDER — DEXTROSE 5 % IV SOLN
1.0000 g | INTRAVENOUS | Status: DC
  Administered 2015-05-31 – 2015-06-03 (×4): 1 g via INTRAVENOUS
  Filled 2015-05-31 (×6): qty 10

## 2015-05-31 MED ORDER — ENOXAPARIN SODIUM 40 MG/0.4ML ~~LOC~~ SOLN
40.0000 mg | SUBCUTANEOUS | Status: DC
  Administered 2015-05-31 – 2015-06-04 (×5): 40 mg via SUBCUTANEOUS
  Filled 2015-05-31 (×5): qty 0.4

## 2015-05-31 MED ORDER — ONDANSETRON HCL 4 MG PO TABS: 4.0000 mg | ORAL_TABLET | Freq: Four times a day (QID) | ORAL | Status: DC | PRN

## 2015-05-31 MED ORDER — POTASSIUM CHLORIDE IN NACL 20-0.45 MEQ/L-% IV SOLN
INTRAVENOUS | Status: DC
  Administered 2015-05-31: 04:00:00 via INTRAVENOUS
  Filled 2015-05-31 (×2): qty 1000

## 2015-05-31 MED ORDER — SODIUM CHLORIDE 0.9 % IV SOLN: INTRAVENOUS | Status: DC

## 2015-05-31 MED ORDER — POTASSIUM CHLORIDE CRYS ER 20 MEQ PO TBCR
40.0000 meq | EXTENDED_RELEASE_TABLET | Freq: Four times a day (QID) | ORAL | Status: AC
  Administered 2015-05-31: 40 meq via ORAL
  Filled 2015-05-31 (×3): qty 2

## 2015-05-31 MED ORDER — VANCOMYCIN HCL 10 G IV SOLR
1500.0000 mg | INTRAVENOUS | Status: DC
  Administered 2015-06-01 – 2015-06-03 (×3): 1500 mg via INTRAVENOUS
  Filled 2015-05-31 (×3): qty 1500

## 2015-05-31 MED ORDER — ONDANSETRON HCL 4 MG/2ML IJ SOLN: 4.0000 mg | Freq: Four times a day (QID) | INTRAMUSCULAR | Status: DC | PRN

## 2015-05-31 NOTE — Progress Notes (Signed)
ANTIBIOTIC CONSULT NOTE - INITIAL  Pharmacy Consult for Vancomyin  Indication: sepsis/UTI  Allergies  Allergen Reactions  . Sulfonamide Derivatives Hives and Shortness Of Breath  . Penicillins Hives and Itching    Patient Measurements: Height: 5\' 5"  (165.1 cm) Weight: 216 lb 4.3 oz (98.1 kg) IBW/kg (Calculated) : 57 Adjusted Body Weight:   Vital Signs: Temp: 98.4 F (36.9 C) (11/29 0322) Temp Source: Oral (11/29 0322) BP: 111/52 mmHg (11/29 0200) Pulse Rate: 88 (11/29 0400) Intake/Output from previous day: 11/28 0701 - 11/29 0700 In: 325 [I.V.:125; IV Piggyback:200] Out: 100 [Urine:100] Intake/Output from this shift: Total I/O In: 325 [I.V.:125; IV Piggyback:200] Out: 100 [Urine:100]  Labs:  Recent Labs  05/30/15 2024 05/30/15 2140 05/31/15 0430  WBC 7.0 7.6 7.2  HGB 10.7* 10.9* 10.9*  PLT 268 290 275  CREATININE 1.10* 1.06* 1.28*   Estimated Creatinine Clearance: 59.6 mL/min (by C-G formula based on Cr of 1.28). No results for input(s): VANCOTROUGH, VANCOPEAK, VANCORANDOM, GENTTROUGH, GENTPEAK, GENTRANDOM, TOBRATROUGH, TOBRAPEAK, TOBRARND, AMIKACINPEAK, AMIKACINTROU, AMIKACIN in the last 72 hours.   Microbiology: Recent Results (from the past 720 hour(s))  MRSA PCR Screening     Status: None   Collection Time: 05/31/15  4:30 AM  Result Value Ref Range Status   MRSA by PCR NEGATIVE NEGATIVE Final    Comment:        The GeneXpert MRSA Assay (FDA approved for NASAL specimens only), is one component of a comprehensive MRSA colonization surveillance program. It is not intended to diagnose MRSA infection nor to guide or monitor treatment for MRSA infections.     Medical History: Past Medical History  Diagnosis Date  . MS (multiple sclerosis) ([place])     a. Dx'd late 20's. b. Tx with Novantrone, Tysabri, Copaxone previously.  Marland Kitchen Hypertension   . Depression   . Glaucoma   . Cardiomyopathy ([place])     a.  Echo 04/29/12: Mild LVH, EF 20-25%, mild AI,  moderate MR, moderate LAE, mild RAE, mild RVE, moderate TR, PASP 51, small pericardial effusion;   b. probably non-ischemic given multiple chemo-Tx agents used for MS and global LV dysfn on echo  . Chronic systolic heart failure ([place])   . CHF (congestive heart failure) ([place])   . Shortness of breath   . Neuromuscular disorder ([place])     MS    Medications:  Anti-infectives    Start     Dose/Rate Route Frequency Ordered Stop   05/31/15 2200  levofloxacin (LEVAQUIN) IVPB 750 mg     750 mg 100 mL/hr over 90 Minutes Intravenous Every 24 hours 05/31/15 0334     05/31/15 0800  vancomycin (VANCOCIN) 1,250 mg in sodium chloride 0.9 % 250 mL IVPB     1,250 mg 166.7 mL/hr over 90 Minutes Intravenous Every 12 hours 05/31/15 0316     05/30/15 2330  vancomycin (VANCOCIN) IVPB 1000 mg/200 mL premix  Status:  Discontinued     1,000 mg 200 mL/hr over 60 Minutes Intravenous Every 12 hours 05/30/15 2319 05/31/15 0316   05/30/15 2230  levofloxacin (LEVAQUIN) IVPB 750 mg     750 mg 100 mL/hr over 90 Minutes Intravenous  Once 05/30/15 2215 05/31/15 0120   05/30/15 2130  fluconazole (DIFLUCAN) tablet 150 mg  Status:  Discontinued     150 mg Oral  Once 05/30/15 2119 05/30/15 2123     Assessment: Patient with sepsis and UTI.  Patient with Enterococcus UTI history.    Goal of Therapy:  Vancomycin trough  level 15-20 mcg/ml  Plan:  Measure antibiotic drug levels at steady state Follow up culture results Vancomycin 1250mg  iv q12hr    Nani Skillern [place] 05/31/2015,6:02 AM

## 2015-05-31 NOTE — Plan of Care (Signed)
Problem: Education: Goal: Knowledge of [facility] Education information/materials will improve Outcome: Not Progressing Delayed response.  Patient sometimes respond appropriately to simple questions.  Problem: Safety: Goal: Ability to remain free from injury will improve Outcome: Completed/Met Date Met:  05/31/15 Patient doesn't move unless instructed to.  No attempt to get out of bed.  Problem: Skin Integrity: Goal: Risk for impaired skin integrity will decrease Outcome: Progressing Turned and repositioned and checked for incontinence q1h-q2h.  Bilateral heel off the bed.

## 2015-05-31 NOTE — Progress Notes (Signed)
MD is aware that patient refused her Potassium PO and foley cath.

## 2015-05-31 NOTE — Progress Notes (Signed)
Initial Nutrition Assessment  DOCUMENTATION CODES:   Non-severe (moderate) malnutrition in context of acute illness/injury, Obesity unspecified  INTERVENTION:   -Provide Ensure Enlive po BID, each supplement provides 350 kcal and 20 grams of protein -Multivitamin with minerals daily -Encourage PO intake -RD to continue to monitor  NUTRITION DIAGNOSIS:   Malnutrition related to acute illness, lethargy/confusion as evidenced by percent weight loss, energy intake < 75% for > 7 days.  GOAL:   Patient will meet greater than or equal to 90% of their needs  MONITOR:   PO intake, Supplement acceptance, Labs, Weight trends, Skin, I & O's  REASON FOR ASSESSMENT:   Low [place]    ASSESSMENT:   54 y.o. female with a past medical history of advanced multiple sclerosis, hypertension, depression, glaucoma, systolic CHF, chronic pain syndrome who is brought to the emergency department via EMS from [place] due to altered mental status and lethargy that began 2 days ago.  Patient unable to provide history during RD visit d/t AMS. Pt did nod her head when she was offered nutritional supplements. RD to order Ensure supplements and multi-vitamin. PO intake: 0%. Pt with 12 lb of weight loss over the last 2 weeks, uncertain if this is fluid related. 5% weight loss x 2 weeks.  Nutrition focused physical exam shows no sign of depletion of muscle mass or body fat.  Labs reviewed: Low K Elevated Creatinine  Diet Order:  Diet Heart Room service appropriate?: Yes; Fluid consistency:: Thin  Skin:   buttocks wound  Last BM:  11/29  Height:   Ht Readings from Last 1 Encounters:  05/31/15 5\' 5"  (1.651 m)    Weight:   Wt Readings from Last 1 Encounters:  05/31/15 216 lb 4.3 oz (98.1 kg)    Ideal Body Weight:  56.8 kg  BMI:  Body mass index is 35.99 kg/(m^2).  Estimated Nutritional Needs:   Kcal:  1650-1850  Protein:  65-75g  Fluid:  1.6L/day  EDUCATION NEEDS:    No education needs identified at this time  Clayton Bibles, MS, RD, LDN Pager: [phone] After Hours Pager: [phone]

## 2015-05-31 NOTE — Progress Notes (Signed)
ANTIBIOTIC CONSULT NOTE - Follow-up  Pharmacy Consult for Vancomyin  Indication: sepsis/UTI  Allergies  Allergen Reactions  . Sulfonamide Derivatives Hives and Shortness Of Breath  . Penicillins Hives and Itching    Patient Measurements: Height: 5\' 5"  (165.1 cm) Weight: 216 lb 4.3 oz (98.1 kg) IBW/kg (Calculated) : 57 Adjusted Body Weight:   Vital Signs: Temp: 98.4 F (36.9 C) (11/29 0322) Temp Source: Oral (11/29 0322) BP: 147/73 mmHg (11/29 0600) Pulse Rate: 93 (11/29 0730) Intake/Output from previous day: 11/28 0701 - 11/29 0700 In: 450 [I.V.:250; IV Piggyback:200] Out: 100 [Urine:100] Intake/Output from this shift:    Labs:  Recent Labs  05/30/15 2024 05/30/15 2140 05/31/15 0430  WBC 7.0 7.6 7.2  HGB 10.7* 10.9* 10.9*  PLT 268 290 275  CREATININE 1.10* 1.06* 1.28*   Estimated Creatinine Clearance: 59.6 mL/min (by C-G formula based on Cr of 1.28). No results for input(s): VANCOTROUGH, VANCOPEAK, VANCORANDOM, GENTTROUGH, GENTPEAK, GENTRANDOM, TOBRATROUGH, TOBRAPEAK, TOBRARND, AMIKACINPEAK, AMIKACINTROU, AMIKACIN in the last 72 hours.   Microbiology: Recent Results (from the past 720 hour(s))  MRSA PCR Screening     Status: None   Collection Time: 05/31/15  4:30 AM  Result Value Ref Range Status   MRSA by PCR NEGATIVE NEGATIVE Final    Comment:        The GeneXpert MRSA Assay (FDA approved for NASAL specimens only), is one component of a comprehensive MRSA colonization surveillance program. It is not intended to diagnose MRSA infection nor to guide or monitor treatment for MRSA infections.     Medications:  Anti-infectives    Start     Dose/Rate Route Frequency Ordered Stop   05/31/15 2200  levofloxacin (LEVAQUIN) IVPB 750 mg     750 mg 100 mL/hr over 90 Minutes Intravenous Every 24 hours 05/31/15 0334     05/31/15 0800  vancomycin (VANCOCIN) 1,250 mg in sodium chloride 0.9 % 250 mL IVPB     1,250 mg 166.7 mL/hr over 90 Minutes Intravenous  Every 12 hours 05/31/15 0316     05/30/15 2330  vancomycin (VANCOCIN) IVPB 1000 mg/200 mL premix  Status:  Discontinued     1,000 mg 200 mL/hr over 60 Minutes Intravenous Every 12 hours 05/30/15 2319 05/31/15 0316   05/30/15 2230  levofloxacin (LEVAQUIN) IVPB 750 mg     750 mg 100 mL/hr over 90 Minutes Intravenous  Once 05/30/15 2215 05/31/15 0120   05/30/15 2130  fluconazole (DIFLUCAN) tablet 150 mg  Status:  Discontinued     150 mg Oral  Once 05/30/15 2119 05/30/15 2123     Assessment: Patient with sepsis and UTI.  Patient with Enterococcus UTI history.    11/29  >> levofloxacin  >> 11/30  >> vancomycin >>    July 2016 urine cx: enterococcus: amp sensitive, quinolone resistant 11/28 blood: 11/28 urine:   Renal: Scr trending up - concern SCr overestimates actual renal fx d/t non-ambulatory state  Goal of Therapy:  Vancomycin trough level 10-15 mcg/ml for UTI  Plan:   Based on worsening renal function with advanced MS, change vancomycin to 1500gm IV q24h  Adjust antibiotics according to pending culture results   Doreene Eland, PharmD, BCPS.   Pager: [phone] 05/31/2015 8:54 AM

## 2015-05-31 NOTE — Progress Notes (Signed)
MRN: [id number] Name: Shannon Garrett  Sex: female Age: 54 y.o. DOB: Apr 27, 1961  [facility] #: Karren Burly Facility/Room: Level Of Care: SNF Provider: Inocencio Homes D Emergency Contacts: Extended Emergency Contact Information Primary Emergency Contact: [place], [place] [address] [place] Phone: [phone] Relation: Sister Secondary Emergency Contact: Jefm Bryant, [place] Phone: [phone] Mobile Phone: [phone] Relation: Friend  Code Status:   Allergies: Sulfonamide derivatives and Penicillins  Chief Complaint  Patient presents with  . Acute Visit    HPI: Patient is 54 y.o. female with MS, HTN, cardiomyopathy, depression who nursing asked me to see urgently for a change in condition. She reported that pt had been sleeping more over past 2 days but with normal sensorium but this evening pt is awake ,sittting up in bed but unable to to answers questions,  can not follow commands. No reports of fever,resp sx or urinary sx. Nothing is making condition better or worse.  Past Medical History  Diagnosis Date  . MS (multiple sclerosis) ([place])     a. Dx'd late 20's. b. Tx with Novantrone, Tysabri, Copaxone previously.  Marland Kitchen Hypertension   . Depression   . Glaucoma   . Cardiomyopathy ([place])     a.  Echo 04/29/12: Mild LVH, EF 20-25%, mild AI, moderate MR, moderate LAE, mild RAE, mild RVE, moderate TR, PASP 51, small pericardial effusion;   b. probably non-ischemic given multiple chemo-Tx agents used for MS and global LV dysfn on echo  . Chronic systolic heart failure ([place])   . CHF (congestive heart failure) ([place])   . Shortness of breath   . Neuromuscular disorder [facility])     MS    Past Surgical History  Procedure Laterality Date  . Ablation      uterine  . Cesarean section        Medication List    Notice    This visit is during an admission. Changes to the med list made in this visit will be  reflected in the After Visit Summary of the admission.     Current Facility-Administered Medications on File Prior to Visit  Medication Dose Route Frequency Provider Last Rate Last Dose  . acetaminophen (TYLENOL) tablet 650 mg  650 mg Oral Q6H PRN Reubin Milan, MD   650 mg at 06/01/15 0008   Or  . acetaminophen (TYLENOL) suppository 650 mg  650 mg Rectal Q6H PRN Reubin Milan, MD      . carvedilol (COREG) tablet 12.5 mg  12.5 mg Oral BID WC Nishant Dhungel, MD      . cefTRIAXone (ROCEPHIN) 1 g in dextrose 5 % 50 mL IVPB  1 g Intravenous Q24H Verlee Monte, MD   1 g at 05/31/15 1235  . enoxaparin (LOVENOX) injection 40 mg  40 mg Subcutaneous Q24H Reubin Milan, MD   40 mg at 05/31/15 0931  . feeding supplement (ENSURE ENLIVE) (ENSURE ENLIVE) liquid 237 mL  237 mL Oral BID BM Clayton Bibles, RD   237 mL at 05/31/15 1614  . hydrALAZINE (APRESOLINE) injection 10 mg  10 mg Intravenous Q6H PRN Nishant Dhungel, MD      . multivitamin with minerals tablet 1 tablet  1 tablet Oral Daily Clayton Bibles, RD   1 tablet at 05/31/15 1303  . ondansetron (ZOFRAN) tablet 4 mg  4 mg Oral Q6H PRN Reubin Milan,  MD       Or  . ondansetron (ZOFRAN) injection 4 mg  4 mg Intravenous Q6H PRN Reubin Milan, MD      . sodium chloride 0.9 % 1,000 mL infusion   Intravenous Continuous Verlee Monte, MD 100 mL/hr at 05/31/15 2213    . vancomycin (VANCOCIN) 1,500 mg in sodium chloride 0.9 % 500 mL IVPB  1,500 mg Intravenous Q24H Berton Mount, RPH   1,500 mg at 06/01/15 0540   Current Outpatient Prescriptions on File Prior to Visit  Medication Sig Dispense Refill  . amantadine (SYMMETREL) 100 MG capsule Take 100 mg by mouth daily.     . baclofen (LIORESAL) 20 MG tablet Take 20 mg by mouth 3 (three) times daily.    . carvedilol (COREG) 12.5 MG tablet Take 1 tablet (12.5 mg total) by mouth 2 (two) times daily with a meal.    . Cholecalciferol (VITAMIN D-3 PO) Take 1 tablet by mouth daily.    Marland Kitchen  CRANBERRY PO Take 100 mg by mouth daily.    . Cyanocobalamin (VITAMIN B-12 PO) Take 1 tablet by mouth daily.    Marland Kitchen docusate sodium (COLACE) 100 MG capsule Take 100 mg by mouth 2 (two) times daily.    . DULoxetine (CYMBALTA) 60 MG capsule Take 60 mg by mouth daily.    . Fluocinolone Acetonide (DERMA-SMOOTHE/FS SCALP) 0.01 % OIL Apply 1 application topically every Wednesday.    . furosemide (LASIX) 80 MG tablet Take 80-160 mg by mouth 2 (two) times daily. Takes 160mg  in the morning and 80mg  at bedtime    . gabapentin (NEURONTIN) 300 MG capsule Take 3 capsules (900 mg total) by mouth 3 (three) times daily. 270 capsule 5  . imipramine (TOFRANIL) 50 MG tablet Take 1.5 tablets (75 mg total) by mouth every evening. For depressive disorder 90 tablet 11  . latanoprost (XALATAN) 0.005 % ophthalmic solution Place 1 drop into both eyes at bedtime.    . magnesium hydroxide (MILK OF MAGNESIA) 400 MG/5ML suspension Take 30 mLs by mouth every 8 (eight) hours as needed for mild constipation or moderate constipation.    . natalizumab (TYSABRI) 300 MG/15ML injection Inject 300 mg into the vein every 30 (thirty) days.    Marland Kitchen oxyCODONE-acetaminophen (PERCOCET) 7.5-325 MG per tablet Take one tablet by mouth every 6 hours as needed for pain. Not to exceed 3gm APAP from all sources/24hr (Patient taking differently: Take 1 tablet by mouth every 6 (six) hours. ) 120 tablet 0  . phenazopyridine (PYRIDIUM) 100 MG tablet Take 100 mg by mouth 3 (three) times daily.     . polyethylene glycol (MIRALAX / GLYCOLAX) packet Take 17 g by mouth 2 (two) times daily. 120 packet 11  . potassium chloride (KLOR-CON) 20 MEQ packet Take 20 mEq by mouth daily.    Marland Kitchen Propylene Glycol (SYSTANE BALANCE) 0.6 % SOLN Place 1 drop into both eyes 2 (two) times daily.    . solifenacin (VESICARE) 10 MG tablet Take 10 mg by mouth daily. For bladder spasms    . spironolactone (ALDACTONE) 25 MG tablet Take 25 mg by mouth daily. For CHF    . tamsulosin (FLOMAX)  0.4 MG CAPS capsule Take 0.4 mg by mouth daily. For incontinence    . zolpidem (AMBIEN) 10 MG tablet Take one tablet by mouth at bedtime for rest 30 tablet 5     No orders of the defined types were placed in this encounter.    Immunization History  Administered  Date(s) Administered  . Influenza,inj,Quad PF,36+ Mos 08/01/2013  . Influenza-Unspecified 08/31/2014  . Pneumococcal Polysaccharide-23 08/01/2013  . Pneumococcal-Unspecified 08/30/2013, 07/29/2014    Social History  Substance Use Topics  . Smoking status: Former Research scientist (life sciences)  . Smokeless tobacco: Never Used  . Alcohol Use: No    Review of Systems - UTO from pt; nursing as per HPI     Filed Vitals:   05/31/15 2248  BP: 109/57  Pulse: 94  Temp: 98.3 F (36.8 C)  Resp: 20  98% RA  Physical Exam  GENERAL APPEARANCE: Alert, non conversant SKIN: No diaphoresis rash HEENT: Unremarkable RESPIRATORY: Breathing is even, unlabored. Lung sounds are clear   CARDIOVASCULAR: Heart RRR no murmurs, rubs or gallops. No peripheral edema  GASTROINTESTINAL: Abdomen is soft, non-tender, not distended w/ normal bowel sounds.  GENITOURINARY: Bladder non tender, not distended  MUSCULOSKELETAL: contractures NEUROLOGIC: Cranial nerves 2-12 grossly intact, can move BUE as baseline; there are no new focal deficits PSYCHIATRIC: flat  Patient Active Problem List   Diagnosis Date Noted  . Mental status alteration 05/30/2015  . Sepsis secondary to UTI ([place]) 05/30/2015  . Hypernatremia 05/30/2015  . Dehydration 05/30/2015  . Chronic anemia 05/30/2015  . Chronic pain syndrome 03/03/2015  . Benign hypertensive heart disease without heart failure 01/18/2015  . Urinary tract infection due to Enterococcus   . Essential hypertension   . Abnormal LFTs   . Foot drop, bilateral   . Pressure ulcer 01/12/2015  . UTI (lower urinary tract infection) 01/11/2015  . Hypovolemia 01/11/2015  . UTI (urinary tract infection), uncomplicated   .  Cardiomyopathy ([place])   . Urinary incontinence 01/07/2015  . Chronic constipation 11/26/2014  . Depression 11/26/2014  . Tachycardia 02/20/2014  . Venous stasis ulcers ([place]) 02/20/2014  . Leg wound, left 11/04/2013  . FTT (failure to thrive) in adult 07/31/2013  . Medically noncompliant 07/31/2013  . Blisters of multiple sites 07/31/2013  . Generalized weakness 06/03/2013  . Insomnia 06/03/2013  . Neurogenic bladder 06/03/2013  . DS (disseminated sclerosis) ([place]) 02/12/2013  . Chronic diastolic heart failure ([place]) 09/29/2012  . Elevated CK 09/10/2012  . Severe malnutrition ([place]) 09/05/2012  . Paraplegia ([place]) 09/05/2012  . Fall 09/04/2012  . Chronic combined systolic and diastolic congestive heart failure ([place]) 05/22/2012  . Other primary cardiomyopathies 05/01/2012  . Immobility 04/28/2012  . OBESITY 11/30/2008  . ANEMIA, CHRONIC 11/30/2008  . MULTIPLE SCLEROSIS, PROGRESSIVE/RELAPSING 11/30/2008  . HYPERTENSION, BENIGN ESSENTIAL 11/30/2008  . ECZEMA 11/30/2008    CBC    Component Value Date/Time   WBC 10.7* 06/01/2015 0340   WBC 6.2 05/18/2015 1626   RBC 3.79* 06/01/2015 0340   RBC 3.55* 05/18/2015 1626   RBC 3.70* 01/12/2015 2111   HGB 10.4* 06/01/2015 0340   HCT 31.9* 06/01/2015 0340   HCT 29.6* 05/18/2015 1626   PLT 268 06/01/2015 0340   MCV 84.2 06/01/2015 0340   LYMPHSABS 2.0 05/31/2015 0430   LYMPHSABS 2.1 05/18/2015 1626   MONOABS 0.8 05/31/2015 0430   EOSABS 0.3 05/31/2015 0430   BASOSABS 0.1 05/31/2015 0430   BASOSABS 0.1 05/18/2015 1626    CMP     Component Value Date/Time   NA 145 06/01/2015 0340   NA 142 09/28/2014   K 3.7 06/01/2015 0340   CL 108 06/01/2015 0340   CO2 28 06/01/2015 0340   GLUCOSE 133* 06/01/2015 0340   BUN 20 06/01/2015 0340   BUN 16 09/28/2014   CREATININE 1.10* 06/01/2015 0340   CALCIUM 9.3 06/01/2015 0340  PROT 7.1 06/01/2015 0340   ALBUMIN 3.6 06/01/2015 0340   AST 25 06/01/2015 0340   ALT 23 06/01/2015 0340    ALKPHOS 126 06/01/2015 0340   BILITOT 0.9 06/01/2015 0340   GFRNONAA 57* 06/01/2015 0340   GFRAA >60 06/01/2015 0340    Assessment and Plan  MENTAL STATUS CHANGE - profound;  CVA vs  infectious vs other; send to hospital for comprehensive work-up   Time spent > 35 min;> 50% of time with patient was spent reviewing records, labs, tests and studies, counseling and developing plan of care  Hennie Duos, MD

## 2015-05-31 NOTE — Care Management Note (Signed)
Case Management Note  Patient Details  Name: Shannon Garrett MRN: [id number] Date of Birth: [birth year]  Subjective/Objective:            Sepsis versus neruo deficit        Action/Plan: Home   Expected Discharge Date:   (UNKNOWN)               Expected Discharge Plan:  Home/Self Care  In-House Referral:  NA  Discharge planning Services  CM Consult  Post Acute Care Choice:  NA Choice offered to:  NA  DME Arranged:    DME Agency:     HH Arranged:    HH Agency:     Status of Service:  In process, will continue to follow  Medicare Important Message Given:    Date Medicare IM Given:    Medicare IM give by:    Date Additional Medicare IM Given:    Additional Medicare Important Message give by:     If discussed at [place] of Stay Meetings, dates discussed:    Additional Comments:  Leeroy Cha, RN 05/31/2015, 10:43 AM

## 2015-05-31 NOTE — Progress Notes (Signed)
[facility] PROGRESS NOTE   Shannon Garrett [id number] DOB: Dec 06, 1960 DOA: 05/30/2015 PCP: Hennie Duos, MD  HPI/Subjective: Patient is slow, unable to answer multiple questions. Still awake and alert but disoriented..   Assessment/Plan: Principal Problem:   Sepsis secondary to UTI [facility]) Active Problems:   MULTIPLE SCLEROSIS, PROGRESSIVE/RELAPSING   Depression   Essential hypertension   Chronic pain syndrome   Mental status alteration   Hypernatremia   Dehydration   Chronic anemia    Sepsis secondary to UTI [facility]) Med sepsis criteria with respiratory rate of 24 and heart rate of 95 the presence of UTI. Aggressive hydration with IV fluids, patient has mild acute renal failure. Started on levofloxacin, switched to Rocephin. Patient is on vancomycin because of history of enterococcus.  Acute renal failure Mild acute renal failure, baseline creatinine is 0.92 and creatinine today is 1.28. Patient was on half normal saline, fluids changed to normal saline at 100.  MULTIPLE SCLEROSIS, PROGRESSIVE/RELAPSING Continue monthly injections as per neurology. Resume analgesics and muscle relaxants once the patient is more alert.  Depression Resume Cymbalta and imipramine once the patient is alert enough to swallow pills.  Essential hypertension Monitor blood pressure closely. Resume carvedilol 12.5 mg by mouth twice a day and spironolactone 25 mg by mouth daily once the patient is more alert. Metoprolol IV when necessary for hypertension or tachycardia until the patient's mental status improves.  Chronic pain syndrome Resume analgesics orally once the patient is alert enough to swallow.  Mental status alteration Supportive care, neuro checks and treatment of sepsis.  Hypernatremia  Dehydration Continue the normal saline.  Chronic anemia Follow-up hematocrit and hemoglobin periodically.  Hypokalemia Replete with oral supplements.  Code Status: Full  Code Family Communication: Plan discussed with the patient. Disposition Plan: Remains inpatient Diet: Diet Heart Room service appropriate?: Yes; Fluid consistency:: Thin  Consultants:  None  Procedures:  None  Antibiotics:  Rocephin and vancomycin   Objective: Filed Vitals:   05/31/15 0600 05/31/15 0730  BP: 147/73   Pulse: 89 93  Temp:  98.1 F (36.7 C)  Resp: 28 39    Intake/Output Summary (Last 24 hours) at 05/31/15 1218 Last data filed at 05/31/15 1200  Gross per 24 hour  Intake   1075 ml  Output    100 ml  Net    975 ml   Filed Weights   05/31/15 0322  Weight: 98.1 kg (216 lb 4.3 oz)    Exam: General: Alert and awake, oriented x3, not in any acute distress. HEENT: anicteric sclera, pupils reactive to light and accommodation, EOMI CVS: S1-S2 clear, no murmur rubs or gallops Chest: clear to auscultation bilaterally, no wheezing, rales or rhonchi Abdomen: soft nontender, nondistended, normal bowel sounds, no organomegaly Extremities: no cyanosis, clubbing or edema noted bilaterally Neuro: Cranial nerves II-XII intact, no focal neurological deficits  Data Reviewed: Basic Metabolic Panel:  Recent Labs Lab 05/30/15 2024 05/30/15 2140 05/31/15 0430  NA 146* 147* 145  K 3.6 3.8 3.4*  CL 104 105 103  CO2 31 31 35*  GLUCOSE 102* 101* 119*  BUN 17 18 18   CREATININE 1.10* 1.06* 1.28*  CALCIUM 9.4 9.7 9.6   Liver Function Tests:  Recent Labs Lab 05/30/15 2024 05/30/15 2140 05/31/15 0430  AST 15 18 15   ALT 14 13* 14  ALKPHOS 140* 135* 147*  BILITOT 0.7 0.6 0.7  PROT 7.0 7.3 7.5  ALBUMIN 3.9 4.0 3.9   No results for input(s): LIPASE, AMYLASE in the last 168  hours. No results for input(s): AMMONIA in the last 168 hours. CBC:  Recent Labs Lab 05/30/15 2024 05/30/15 2140 05/31/15 0430  WBC 7.0 7.6 7.2  NEUTROABS  --  4.3 4.0  HGB 10.7* 10.9* 10.9*  HCT 32.5* 33.5* 32.9*  MCV 84.4 84.6 86.6  PLT 268 290 275   Cardiac Enzymes: No  results for input(s): CKTOTAL, CKMB, CKMBINDEX, TROPONINI in the last 168 hours. BNP (last 3 results) No results for input(s): BNP in the last 8760 hours.  ProBNP (last 3 results) No results for input(s): PROBNP in the last 8760 hours.  CBG:  Recent Labs Lab 05/30/15 1950  GLUCAP 101*    Micro Recent Results (from the past 240 hour(s))  MRSA PCR Screening     Status: None   Collection Time: 05/31/15  4:30 AM  Result Value Ref Range Status   MRSA by PCR NEGATIVE NEGATIVE Final    Comment:        The GeneXpert MRSA Assay (FDA approved for NASAL specimens only), is one component of a comprehensive MRSA colonization surveillance program. It is not intended to diagnose MRSA infection nor to guide or monitor treatment for MRSA infections.      Studies: Dg Chest Port 1 View  05/30/2015  CLINICAL DATA:  Lethargy and altered mental status, 2 days duration. Febrile. EXAM: PORTABLE CHEST 1 VIEW COMPARISON:  01/10/2015 FINDINGS: A single AP portable view of the chest demonstrates no focal airspace consolidation or alveolar edema. The lungs are grossly clear. There is no large effusion or pneumothorax. Cardiac and mediastinal contours appear unremarkable. IMPRESSION: No active disease. Electronically Signed   By: Andreas Newport M.D.   On: 05/30/2015 21:35    Scheduled Meds: . cefTRIAXone (ROCEPHIN)  IV  1 g Intravenous Q24H  . enoxaparin (LOVENOX) injection  40 mg Subcutaneous Q24H  . feeding supplement (ENSURE ENLIVE)  237 mL Oral BID BM  . multivitamin with minerals  1 tablet Oral Daily  . potassium chloride  40 mEq Oral Q6H  . [START ON 06/01/2015] vancomycin  1,500 mg Intravenous Q24H   Continuous Infusions: . sodium chloride 0.9 % 1,000 mL infusion         Time spent: 35 minutes    [facility]  [facility] Pager [phone] If 7PM-7AM, please contact night-coverage at [email or web address], password [facility] 05/31/2015, 12:18 PM  LOS: 1 day

## 2015-05-31 NOTE — Progress Notes (Signed)
Potassium offered again and patient refused to take it but she did take the Multivitamin.  She also refused foley cath and she stated that she had one before.  I will notify MD.

## 2015-05-31 NOTE — Progress Notes (Signed)
Patient refused to take her med, Potassium and multivitamin.  Offered three times.  She also refused to for foley cath insertion.  I will try to ask her again later.

## 2015-05-31 NOTE — ED Notes (Signed)
[facility] RN called about ready bed assigned. Hold on transporting pt per 2 west charge RN due to possible alternate placement. Lattie Haw, ED charge RN, aware.

## 2015-05-31 NOTE — ED Notes (Signed)
Pt repositioned on left side in bed.

## 2015-06-01 DIAGNOSIS — G9340 Encephalopathy, unspecified: Secondary | ICD-10-CM

## 2015-06-01 DIAGNOSIS — I10 Essential (primary) hypertension: Secondary | ICD-10-CM

## 2015-06-01 DIAGNOSIS — N179 Acute kidney failure, unspecified: Secondary | ICD-10-CM

## 2015-06-01 DIAGNOSIS — G35 Multiple sclerosis: Secondary | ICD-10-CM

## 2015-06-01 LAB — COMPREHENSIVE METABOLIC PANEL
ALBUMIN: 3.6 g/dL (ref 3.5–5.0)
ALT: 23 U/L (ref 14–54)
ANION GAP: 9 (ref 5–15)
AST: 25 U/L (ref 15–41)
Alkaline Phosphatase: 126 U/L (ref 38–126)
BUN: 20 mg/dL (ref 6–20)
CHLORIDE: 108 mmol/L (ref 101–111)
CO2: 28 mmol/L (ref 22–32)
CREATININE: 1.1 mg/dL — AB (ref 0.44–1.00)
Calcium: 9.3 mg/dL (ref 8.9–10.3)
GFR calc non Af Amer: 57 mL/min — ABNORMAL LOW (ref >60)
Glucose, Bld: 133 mg/dL — ABNORMAL HIGH (ref 65–99)
Potassium: 3.7 mmol/L (ref 3.5–5.1)
SODIUM: 145 mmol/L (ref 135–145)
Total Bilirubin: 0.9 mg/dL (ref 0.3–1.2)
Total Protein: 7.1 g/dL (ref 6.5–8.1)

## 2015-06-01 LAB — CBC
HCT: 31.9 % — ABNORMAL LOW (ref 36.0–46.0)
HEMOGLOBIN: 10.4 g/dL — AB (ref 12.0–15.0)
MCH: 27.4 pg (ref 26.0–34.0)
MCHC: 32.6 g/dL (ref 30.0–36.0)
MCV: 84.2 fL (ref 78.0–100.0)
Platelets: 268 10*3/uL (ref 150–400)
RBC: 3.79 MIL/uL — AB (ref 3.87–5.11)
RDW: 16.3 % — ABNORMAL HIGH (ref 11.5–15.5)
WBC: 10.7 10*3/uL — ABNORMAL HIGH (ref 4.0–10.5)

## 2015-06-01 MED ORDER — TAMSULOSIN HCL 0.4 MG PO CAPS
0.4000 mg | ORAL_CAPSULE | Freq: Every day | ORAL | Status: DC
  Administered 2015-06-01 – 2015-06-04 (×4): 0.4 mg via ORAL
  Filled 2015-06-01 (×4): qty 1

## 2015-06-01 MED ORDER — AMANTADINE HCL 100 MG PO CAPS
100.0000 mg | ORAL_CAPSULE | Freq: Every day | ORAL | Status: DC
  Administered 2015-06-01 – 2015-06-04 (×4): 100 mg via ORAL
  Filled 2015-06-01 (×5): qty 1

## 2015-06-01 MED ORDER — HYDRALAZINE HCL 20 MG/ML IJ SOLN
10.0000 mg | Freq: Four times a day (QID) | INTRAMUSCULAR | Status: DC | PRN
  Administered 2015-06-01: 10 mg via INTRAVENOUS
  Filled 2015-06-01: qty 1

## 2015-06-01 MED ORDER — CARVEDILOL 12.5 MG PO TABS
12.5000 mg | ORAL_TABLET | Freq: Two times a day (BID) | ORAL | Status: DC
  Administered 2015-06-01 – 2015-06-04 (×7): 12.5 mg via ORAL
  Filled 2015-06-01 (×10): qty 1

## 2015-06-01 NOTE — Clinical Documentation Improvement (Signed)
Hospitalist  Can the diagnosis of Malnutrition be further specified?   Document Severity - Severe(third degree), Moderate (second degree), Mild (first degree)  Other condition  Unable to clinically determine  Document any associated diagnoses/conditions Please update your documentation within the medical record to reflect your response to this query. Thank you  Supporting Information: Initial Nutrition Assessment by Clayton Bibles, RD at 05/31/2015 12:35 PM  DOCUMENTATION CODES:  Non-severe (moderate) malnutrition in context of acute illness/injury, Obesity unspecified   INTERVENTION:  -Provide Ensure Enlive po BID, each supplement provides 350 kcal and 20 grams of protein  -Multivitamin with minerals daily  -Encourage PO intake  -RD to continue to monitor   NUTRITION DIAGNOSIS:  Malnutrition related to acute illness, lethargy/confusion as evidenced by percent weight loss, energy intake < 75% for > 7 days.  Please exercise your independent, professional judgment when responding. A specific answer is not anticipated or expected.  Thank You, Alessandra Grout, RN, BSN, CCDS,Clinical Documentation Specialist:  [phone]  [phone]=Cell - Health Information Management

## 2015-06-01 NOTE — Clinical Social Work Note (Signed)
Clinical Social Work Assessment  Patient Details  Name: Shannon Garrett MRN: [id number] Date of Birth: 01-25-1961  Date of referral:  06/01/15               Reason for consult:  Facility Placement, Discharge Planning                Permission sought to share information with:  Facility Art therapist granted to share information::  Yes, Verbal Permission Granted  Name::        Agency::     Relationship::     Contact Information:     Housing/Transportation Living arrangements for the past 2 months:  [place] of Information:  Facility Patient Interpreter Needed:  None Criminal Activity/Legal Involvement Pertinent to Current Situation/Hospitalization:  No - Comment as needed Significant Relationships:    Lives with:  Siblings Do you feel safe going back to the place where you live?   (Pt did not respond to question.) Need for family participation in patient care:  Yes (Comment)  Care giving concerns:  Pt's sister, Shannon Garrett ( [phone] ) has reported care giving concerns at [facility]. A complaint has been filed at facility by pt / family.  Pt / family are looking into possible transfer to a different SNF.   Social Worker assessment / plan:  Pt is a LTC resident from [facility]. She was hospitalized on 05/30/15 with Sepsis secondary to a UTI. CSW contacted pt's sister, to assist with d/c planning. CSW met with pt but pt did not respond to CSW's questions. Pt's sister reported that pt will return to [facility] care at d/c. SNF as been contacted and d/c plan has been confirmed. CSW will continue to follow to assist with d/c planning to SNF.   Employment status:  Disabled (Comment on whether or not currently receiving Disability) Insurance information:  Programmer, applications, Medicaid [place] PT Recommendations:  Not assessed at this time Information / Referral to community resources:     Patient/Family's Response to  care:  Sister is in agreement with d/c plan to SNF when medically stable.  Patient/Family's Understanding of and Emotional Response to Diagnosis, Current Treatment, and Prognosis:  Sister reports that she as been looking at facilities that accept medicaid for a possible transfer. " My sister and I  have not been pleased wit the care at [facility]. I have reported our concerns. Shannon Garrett will return to [place] but I may need to transfer her to a different nursing home. "  Emotional Assessment Appearance:  Appears stated age Attitude/Demeanor/Rapport:  Other (cooperative) Affect (typically observed):  Calm Orientation:  Oriented to Self Alcohol / Substance use:  Not Applicable Psych involvement (Current and /or in the community):  No (Comment)  Discharge Needs  Concerns to be addressed:  Discharge Planning Concerns Readmission within the last 30 days:  No Current discharge risk:  None Barriers to Discharge:  No Barriers Identified   Luretha Rued, [place] 06/01/2015, 2:54 PM

## 2015-06-01 NOTE — Progress Notes (Signed)
[facility] PROGRESS NOTE  Shannon Garrett [id number] DOB: 1961/01/19 DOA: 05/30/2015 PCP: Hennie Duos, MD  Brief narrative 54 year old female with history of advanced multiple sclerosis, hypertension, depression, glaucoma, systolic CHF, chronic pain syndrome and chronic anemia , resident of [facility] living [place] [facility] facility was brought in by EMS for change in her mental status and lethargy for past 2 days. Patient baseline is nonambulatory but will oriented and interactive and well. Patient septic on admission with tachypnea , fever of 101 per EMS, acute encephalopathy and acute kidney injury with workup suggestive of UTI. Admitted to stepdown unit.   Assessment/Plan: Sepsis secondary to UTI Continue IV hydration. Empiric Rocephin and vancomycin (given history of enterococcus). Urine culture pending. Remains afebrile but mental status still not back to baseline.  Acute kidney injury Possibly severe to sepsis from UTI and dehydration. Improving with aggressive hydration.  Acute metabolic encephalopathy Secondary to sepsis. Still appears confused and very slow to respond. Continue to monitor. Continue neuro checks.  Uncontrolled hypertension Continue Coreg. Added when necessary hydralazine.  Multiple sclerosis, progressive since relapsing Resume baclofen once patient more oriented. Continue amantadine. Continue monthly injections as outpatient.  Depression Hold Cymbalta and imipramine until mental status improves.  Chronic pain syndrome Holding analgesics given her encephalopathy. Resume once improved.  Hypokalemia Replenish  Protein  calorie malnutrition Added supplement  Anemia of chronic disease  Code Status: Full code Family Communication: None at bedside Disposition Plan: Continue step down monitoring for today   Consultants:  None  Procedures:  None  Antibiotics:  IV vancomycin and Rocephin  HPI/Subjective: Seen and  examined. Overnight issues. Patient tachypnic episodically and has elevated blood pressure. Denies any pain.  Objective: Filed Vitals:   06/01/15 1200 06/01/15 1400  BP: 158/97 157/87  Pulse: 96 96  Temp:    Resp: 26 26    Intake/Output Summary (Last 24 hours) at 06/01/15 1558 Last data filed at 06/01/15 1400  Gross per 24 hour  Intake   1950 ml  Output      0 ml  Net   1950 ml   Filed Weights   05/31/15 0322  Weight: 98.1 kg (216 lb 4.3 oz)    Exam:   General:  Middle aged female not in distress, confused  HEENT: Moist oral mucosa  Chest: Clear to auscultation bilaterally, no added sounds  CVS: Normal S1 and S2, no murmurs or gallop  GI: Soft, nondistended, nontender, bowel sounds present  Musculoskeletal: Warm, no edema  CNS: Awake and alert poorly communicative and responds to a few simple questions only   Data Reviewed: Basic Metabolic Panel:  Recent Labs Lab 05/30/15 2024 05/30/15 2140 05/31/15 0430 06/01/15 0340  NA 146* 147* 145 145  K 3.6 3.8 3.4* 3.7  CL 104 105 103 108  CO2 31 31 35* 28  GLUCOSE 102* 101* 119* 133*  BUN 17 18 18 20   CREATININE 1.10* 1.06* 1.28* 1.10*  CALCIUM 9.4 9.7 9.6 9.3   Liver Function Tests:  Recent Labs Lab 05/30/15 2024 05/30/15 2140 05/31/15 0430 06/01/15 0340  AST 15 18 15 25   ALT 14 13* 14 23  ALKPHOS 140* 135* 147* 126  BILITOT 0.7 0.6 0.7 0.9  PROT 7.0 7.3 7.5 7.1  ALBUMIN 3.9 4.0 3.9 3.6   No results for input(s): LIPASE, AMYLASE in the last 168 hours. No results for input(s): AMMONIA in the last 168 hours. CBC:  Recent Labs Lab 05/30/15 2024 05/30/15 2140 05/31/15 0430 06/01/15 0340  WBC 7.0  7.6 7.2 10.7*  NEUTROABS  --  4.3 4.0  --   HGB 10.7* 10.9* 10.9* 10.4*  HCT 32.5* 33.5* 32.9* 31.9*  MCV 84.4 84.6 86.6 84.2  PLT 268 290 275 268   Cardiac Enzymes: No results for input(s): CKTOTAL, CKMB, CKMBINDEX, TROPONINI in the last 168 hours. BNP (last 3 results) No results for input(s):  BNP in the last 8760 hours.  ProBNP (last 3 results) No results for input(s): PROBNP in the last 8760 hours.  CBG:  Recent Labs Lab 05/30/15 1950  GLUCAP 101*    Recent Results (from the past 240 hour(s))  Culture, blood (routine x 2)     Status: None (Preliminary result)   Collection Time: 05/30/15  8:15 PM  Result Value Ref Range Status   Specimen Description BLOOD LEFT HAND  Final   Special Requests BOTTLES DRAWN AEROBIC AND ANAEROBIC 5CC  Final   Culture   Final    NO GROWTH 1 DAY Performed at [facility]    Report Status PENDING  Incomplete  Urine culture     Status: None (Preliminary result)   Collection Time: 05/30/15  9:17 PM  Result Value Ref Range Status   Specimen Description URINE, CATHETERIZED  Final   Special Requests NONE  Final   Culture   Final    CULTURE REINCUBATED FOR BETTER GROWTH Performed at [facility]    Report Status PENDING  Incomplete  Culture, blood (routine x 2)     Status: None (Preliminary result)   Collection Time: 05/30/15  9:40 PM  Result Value Ref Range Status   Specimen Description BLOOD RIGHT ANTECUBITAL  Final   Special Requests BOTTLES DRAWN AEROBIC AND ANAEROBIC 5CC  Final   Culture   Final    NO GROWTH 1 DAY Performed at [facility]    Report Status PENDING  Incomplete  MRSA PCR Screening     Status: None   Collection Time: 05/31/15  4:30 AM  Result Value Ref Range Status   MRSA by PCR NEGATIVE NEGATIVE Final    Comment:        The GeneXpert MRSA Assay (FDA approved for NASAL specimens only), is one component of a comprehensive MRSA colonization surveillance program. It is not intended to diagnose MRSA infection nor to guide or monitor treatment for MRSA infections.      Studies: Dg Chest Port 1 View  05/30/2015  CLINICAL DATA:  Lethargy and altered mental status, 2 days duration. Febrile. EXAM: PORTABLE CHEST 1 VIEW COMPARISON:  01/10/2015 FINDINGS: A single AP portable view of the chest  demonstrates no focal airspace consolidation or alveolar edema. The lungs are grossly clear. There is no large effusion or pneumothorax. Cardiac and mediastinal contours appear unremarkable. IMPRESSION: No active disease. Electronically Signed   By: Andreas Newport M.D.   On: 05/30/2015 21:35    Scheduled Meds: . carvedilol  12.5 mg Oral BID WC  . cefTRIAXone (ROCEPHIN)  IV  1 g Intravenous Q24H  . enoxaparin (LOVENOX) injection  40 mg Subcutaneous Q24H  . feeding supplement (ENSURE ENLIVE)  237 mL Oral BID BM  . multivitamin with minerals  1 tablet Oral Daily  . vancomycin  1,500 mg Intravenous Q24H   Continuous Infusions: . sodium chloride 0.9 % 1,000 mL infusion 100 mL/hr at 06/01/15 1400      Time spent: 25 minutes   Kaileena Obi, [place]  [facility] Pager [phone]. If 7PM-7AM, please contact night-coverage at [email or web address], password [facility] 06/01/2015, 3:58  PM  LOS: 2 days

## 2015-06-01 NOTE — NC FL2 (Signed)
[place] LEVEL OF CARE SCREENING TOOL     IDENTIFICATION  Patient Name: Shannon Garrett Birthdate: [birth year] Sex: female Admission Date (Current Location): 05/30/2015  [facility] [place] Number:     Facility and Address:  [facility],  [place] [address], [place]      Provider Number: [id number]  Attending Physician Name and Address:  Louellen Molder, MD  Relative Name and Phone Number:       Current Level of Care: Hospital Recommended Level of Care: [place] Prior Approval Number:    Date Approved/Denied:   PASRR Number:    Discharge Plan: SNF    Current Diagnoses: Patient Active Problem List   Diagnosis Date Noted  . Mental status alteration 05/30/2015  . Sepsis secondary to UTI ([place]) 05/30/2015  . Hypernatremia 05/30/2015  . Dehydration 05/30/2015  . Chronic anemia 05/30/2015  . Chronic pain syndrome 03/03/2015  . Benign hypertensive heart disease without heart failure 01/18/2015  . Urinary tract infection due to Enterococcus   . Essential hypertension   . Abnormal LFTs   . Foot drop, bilateral   . Pressure ulcer 01/12/2015  . UTI (lower urinary tract infection) 01/11/2015  . Hypovolemia 01/11/2015  . UTI (urinary tract infection), uncomplicated   . Cardiomyopathy ([place])   . Urinary incontinence 01/07/2015  . Chronic constipation 11/26/2014  . Depression 11/26/2014  . Tachycardia 02/20/2014  . Venous stasis ulcers ([place]) 02/20/2014  . Leg wound, left 11/04/2013  . FTT (failure to thrive) in adult 07/31/2013  . Medically noncompliant 07/31/2013  . Blisters of multiple sites 07/31/2013  . Generalized weakness 06/03/2013  . Insomnia 06/03/2013  . Neurogenic bladder 06/03/2013  . DS (disseminated sclerosis) ([place]) 02/12/2013  . Chronic diastolic heart failure ([place]) 09/29/2012  . Elevated CK 09/10/2012  . Severe malnutrition ([place]) 09/05/2012  . Paraplegia ([place]) 09/05/2012  . Fall 09/04/2012  . Chronic  combined systolic and diastolic congestive heart failure ([place]) 05/22/2012  . Other primary cardiomyopathies 05/01/2012  . Immobility 04/28/2012  . OBESITY 11/30/2008  . ANEMIA, CHRONIC 11/30/2008  . MULTIPLE SCLEROSIS, PROGRESSIVE/RELAPSING 11/30/2008  . HYPERTENSION, BENIGN ESSENTIAL 11/30/2008  . ECZEMA 11/30/2008    Orientation ACTIVITIES/SOCIAL BLADDER RESPIRATION    Self  Passive Incontinent    BEHAVIORAL SYMPTOMS/MOOD NEUROLOGICAL BOWEL NUTRITION STATUS   (no behaviors)   Incontinent    PHYSICIAN VISITS COMMUNICATION OF NEEDS Height & Weight Skin     (minimal communication) 5\' 5"  (165.1 cm) 216 lbs. Other (Comment) (wound on buttocks)          AMBULATORY STATUS RESPIRATION    Assist extensive        Personal Care Assistance Level of Assistance  Bathing, Feeding, Dressing Bathing Assistance: Maximum assistance Feeding assistance: Limited assistance Dressing Assistance: Maximum assistance      Functional Limitations Info  Sight, Hearing, Speech Sight Info: Adequate Hearing Info: Adequate Speech Info: Adequate       SPECIAL CARE FACTORS FREQUENCY                      Additional Factors Info  Code Status Code Status Info: Full Code             Current Medications (06/01/2015):  This is the current hospital active medication list Current Facility-Administered Medications  Medication Dose Route Frequency Provider Last Rate Last Dose  . acetaminophen (TYLENOL) tablet 650 mg  650 mg Oral Q6H PRN Reubin Milan, MD   650 mg at 06/01/15  0008   Or  . acetaminophen (TYLENOL) suppository 650 mg  650 mg Rectal Q6H PRN Reubin Milan, MD      . carvedilol (COREG) tablet 12.5 mg  12.5 mg Oral BID WC Nishant Dhungel, MD   12.5 mg at 06/01/15 0917  . cefTRIAXone (ROCEPHIN) 1 g in dextrose 5 % 50 mL IVPB  1 g Intravenous Q24H Verlee Monte, MD   1 g at 06/01/15 1314  . enoxaparin (LOVENOX) injection 40 mg  40 mg Subcutaneous Q24H Reubin Milan, MD    40 mg at 06/01/15 [id number]  . feeding supplement (ENSURE ENLIVE) (ENSURE ENLIVE) liquid 237 mL  237 mL Oral BID BM Clayton Bibles, RD   237 mL at 06/01/15 1400  . hydrALAZINE (APRESOLINE) injection 10 mg  10 mg Intravenous Q6H PRN Nishant Dhungel, MD      . multivitamin with minerals tablet 1 tablet  1 tablet Oral Daily Clayton Bibles, RD   1 tablet at 06/01/15 0917  . ondansetron (ZOFRAN) tablet 4 mg  4 mg Oral Q6H PRN Reubin Milan, MD       Or  . ondansetron [facility]) injection 4 mg  4 mg Intravenous Q6H PRN Reubin Milan, MD      . sodium chloride 0.9 % 1,000 mL infusion   Intravenous Continuous Verlee Monte, MD 100 mL/hr at 06/01/15 1400    . vancomycin (VANCOCIN) 1,500 mg in sodium chloride 0.9 % 500 mL IVPB  1,500 mg Intravenous Q24H Berton Mount, RPH   1,500 mg at 06/01/15 0540     Discharge Medications: Please see discharge summary for a list of discharge medications.  Relevant Imaging Results:  Relevant Lab Results:  Recent Labs    Additional Information SS # [id number]  Randee Huston, Randall An, LCSW

## 2015-06-02 DIAGNOSIS — E870 Hyperosmolality and hypernatremia: Secondary | ICD-10-CM

## 2015-06-02 DIAGNOSIS — E860 Dehydration: Secondary | ICD-10-CM

## 2015-06-02 LAB — COMPREHENSIVE METABOLIC PANEL
ALT: 20 U/L (ref 14–54)
AST: 21 U/L (ref 15–41)
Albumin: 3.3 g/dL — ABNORMAL LOW (ref 3.5–5.0)
Alkaline Phosphatase: 123 U/L (ref 38–126)
Anion gap: 11 (ref 5–15)
BILIRUBIN TOTAL: 0.8 mg/dL (ref 0.3–1.2)
BUN: 18 mg/dL (ref 6–20)
CO2: 25 mmol/L (ref 22–32)
CREATININE: 0.99 mg/dL (ref 0.44–1.00)
Calcium: 9.1 mg/dL (ref 8.9–10.3)
Chloride: 114 mmol/L — ABNORMAL HIGH (ref 101–111)
GFR calc Af Amer: >60 mL/min (ref >60)
GLUCOSE: 119 mg/dL — AB (ref 65–99)
Potassium: 3.5 mmol/L (ref 3.5–5.1)
Sodium: 150 mmol/L — ABNORMAL HIGH (ref 135–145)
TOTAL PROTEIN: 6.5 g/dL (ref 6.5–8.1)

## 2015-06-02 LAB — CBC
HCT: 32 % — ABNORMAL LOW (ref 36.0–46.0)
Hemoglobin: 10.4 g/dL — ABNORMAL LOW (ref 12.0–15.0)
MCH: 27.5 pg (ref 26.0–34.0)
MCHC: 32.5 g/dL (ref 30.0–36.0)
MCV: 84.7 fL (ref 78.0–100.0)
PLATELETS: 257 10*3/uL (ref 150–400)
RBC: 3.78 MIL/uL — ABNORMAL LOW (ref 3.87–5.11)
RDW: 16.3 % — AB (ref 11.5–15.5)
WBC: 12.2 10*3/uL — AB (ref 4.0–10.5)

## 2015-06-02 LAB — URINE CULTURE

## 2015-06-02 MED ORDER — FREE WATER
200.0000 mL | Freq: Three times a day (TID) | Status: DC
  Administered 2015-06-02 – 2015-06-03 (×6): 200 mL

## 2015-06-02 MED ORDER — DULOXETINE HCL 60 MG PO CPEP
60.0000 mg | ORAL_CAPSULE | Freq: Every day | ORAL | Status: DC
  Administered 2015-06-02 – 2015-06-04 (×3): 60 mg via ORAL
  Filled 2015-06-02: qty 1
  Filled 2015-06-02: qty 2
  Filled 2015-06-02: qty 1

## 2015-06-02 MED ORDER — DEXTROSE 5 % IV SOLN
INTRAVENOUS | Status: DC
  Administered 2015-06-02 – 2015-06-04 (×3): via INTRAVENOUS

## 2015-06-02 MED ORDER — FUROSEMIDE 40 MG PO TABS: 40.0000 mg | ORAL_TABLET | Freq: Two times a day (BID) | ORAL | Status: DC

## 2015-06-02 MED ORDER — IMIPRAMINE HCL 25 MG PO TABS
75.0000 mg | ORAL_TABLET | Freq: Every evening | ORAL | Status: DC
Start: 2015-06-02 — End: 2015-06-04
  Administered 2015-06-02 – 2015-06-03 (×2): 75 mg via ORAL
  Filled 2015-06-02 (×4): qty 1

## 2015-06-02 NOTE — Progress Notes (Signed)
[facility] PROGRESS NOTE  Shannon Garrett [id number] DOB: 1961-03-29 DOA: 05/30/2015 PCP: Hennie Duos, MD  Brief narrative 54 year old female with history of advanced multiple sclerosis, hypertension, depression, glaucoma, systolic CHF, chronic pain syndrome and chronic anemia , resident of [facility] living [place] skilled nursing facility was brought in by EMS for change in her mental status and lethargy for past 2 days. Patient baseline is nonambulatory but will oriented and interactive and well. Patient septic on admission with tachypnea , fever of 101 per EMS, acute encephalopathy and acute kidney injury with workup suggestive of UTI. Admitted to stepdown unit.   Assessment/Plan: Sepsis secondary to UTI Continue IV hydration. Empiric Rocephin and vancomycin (given history of enterococcus). Urine culture pending. Remains afebrile but mental status still not back to baseline. Has low grade temp and wbc trending up.  Acute kidney injury Possibly severe to sepsis from UTI and dehydration. Improving with aggressive hydration. Held lasix and aldactone.  Acute metabolic encephalopathy Secondary to sepsis. on multiple meds including baclofen, Neurontin, antidepressants and Ambien which are held. Still appears confused and very slow to respond although talking better today.. Continue to monitor.   Uncontrolled hypertension Continue Coreg. BP stable this am. Added when necessary hydralazine.  Multiple sclerosis, progressive since relapsing Resume baclofen once patient more oriented. Continue amantadine. Continue monthly injections as outpatient.  Depression Resume cymbalta and imipramine.  Chronic pain syndrome Holding analgesics given her encephalopathy. Resume once improved.  Hypokalemia Replenish  Protein  calorie malnutrition Added supplement  Anemia of chronic disease  Code Status: Full code Family Communication: None at bedside Disposition Plan: transfer to  med surg. D/c back to SNF once mental status better. possibly yin 2-3 days   Consultants:  None  Procedures:  None  Antibiotics:  IV vancomycin and Rocephin  HPI/Subjective: Seen and examined. Overnight issues. Patient tachypnic episodically and has elevated blood pressure. Denies any pain.  Objective: Filed Vitals:   06/02/15 0400 06/02/15 0600  BP: 136/93 147/82  Pulse:  105  Temp: 99.4 F (37.4 C)   Resp: 35 35    Intake/Output Summary (Last 24 hours) at 06/02/15 0815 Last data filed at 06/02/15 [id number]  Gross per 24 hour  Intake 3118.33 ml  Output      8 ml  Net 3110.33 ml   Filed Weights   05/31/15 0322  Weight: 98.1 kg (216 lb 4.3 oz)    Exam:   General:  Middle aged female not in distress, confused  HEENT: Moist oral mucosa  Chest: Clear to auscultation bilaterally, no added sounds  CVS: Normal S1 and S2, no murmurs or gallop  GI: Soft, nondistended, nontender, bowel sounds present  Musculoskeletal: Warm, no edema  CNS: Awake and alert poorly communicative and responds to a few simple questions only   Data Reviewed: Basic Metabolic Panel:  Recent Labs Lab 05/30/15 2024 05/30/15 2140 05/31/15 0430 06/01/15 0340 06/02/15 0347  NA 146* 147* 145 145 150*  K 3.6 3.8 3.4* 3.7 3.5  CL 104 105 103 108 114*  CO2 31 31 35* 28 25  GLUCOSE 102* 101* 119* 133* 119*  BUN 17 18 18 20 18   CREATININE 1.10* 1.06* 1.28* 1.10* 0.99  CALCIUM 9.4 9.7 9.6 9.3 9.1   Liver Function Tests:  Recent Labs Lab 05/30/15 2024 05/30/15 2140 05/31/15 0430 06/01/15 0340 06/02/15 0347  AST 15 18 15 25 21   ALT 14 13* 14 23 20   ALKPHOS 140* 135* 147* 126 123  BILITOT 0.7 0.6 0.7 0.9 0.8  PROT 7.0 7.3 7.5 7.1 6.5  ALBUMIN 3.9 4.0 3.9 3.6 3.3*   No results for input(s): LIPASE, AMYLASE in the last 168 hours. No results for input(s): AMMONIA in the last 168 hours. CBC:  Recent Labs Lab 05/30/15 2024 05/30/15 2140 05/31/15 0430 06/01/15 0340  06/02/15 0347  WBC 7.0 7.6 7.2 10.7* 12.2*  NEUTROABS  --  4.3 4.0  --   --   HGB 10.7* 10.9* 10.9* 10.4* 10.4*  HCT 32.5* 33.5* 32.9* 31.9* 32.0*  MCV 84.4 84.6 86.6 84.2 84.7  PLT 268 290 275 268 257   Cardiac Enzymes: No results for input(s): CKTOTAL, CKMB, CKMBINDEX, TROPONINI in the last 168 hours. BNP (last 3 results) No results for input(s): BNP in the last 8760 hours.  ProBNP (last 3 results) No results for input(s): PROBNP in the last 8760 hours.  CBG:  Recent Labs Lab 05/30/15 1950  GLUCAP 101*    Recent Results (from the past 240 hour(s))  Culture, blood (routine x 2)     Status: None (Preliminary result)   Collection Time: 05/30/15  8:15 PM  Result Value Ref Range Status   Specimen Description BLOOD LEFT HAND  Final   Special Requests BOTTLES DRAWN AEROBIC AND ANAEROBIC 5CC  Final   Culture   Final    NO GROWTH 1 DAY Performed at [facility]    Report Status PENDING  Incomplete  Urine culture     Status: None (Preliminary result)   Collection Time: 05/30/15  9:17 PM  Result Value Ref Range Status   Specimen Description URINE, CATHETERIZED  Final   Special Requests NONE  Final   Culture   Final    CULTURE REINCUBATED FOR BETTER GROWTH Performed at [facility]    Report Status PENDING  Incomplete  Culture, blood (routine x 2)     Status: None (Preliminary result)   Collection Time: 05/30/15  9:40 PM  Result Value Ref Range Status   Specimen Description BLOOD RIGHT ANTECUBITAL  Final   Special Requests BOTTLES DRAWN AEROBIC AND ANAEROBIC 5CC  Final   Culture   Final    NO GROWTH 1 DAY Performed at [facility]    Report Status PENDING  Incomplete  MRSA PCR Screening     Status: None   Collection Time: 05/31/15  4:30 AM  Result Value Ref Range Status   MRSA by PCR NEGATIVE NEGATIVE Final    Comment:        The GeneXpert MRSA Assay (FDA approved for NASAL specimens only), is one component of a comprehensive MRSA  colonization surveillance program. It is not intended to diagnose MRSA infection nor to guide or monitor treatment for MRSA infections.      Studies: No results found.  Scheduled Meds: . amantadine  100 mg Oral Daily  . carvedilol  12.5 mg Oral BID WC  . cefTRIAXone (ROCEPHIN)  IV  1 g Intravenous Q24H  . enoxaparin (LOVENOX) injection  40 mg Subcutaneous Q24H  . feeding supplement (ENSURE ENLIVE)  237 mL Oral BID BM  . multivitamin with minerals  1 tablet Oral Daily  . tamsulosin  0.4 mg Oral Daily  . vancomycin  1,500 mg Intravenous Q24H   Continuous Infusions: . dextrose 75 mL/hr at 06/02/15 0800      Time spent: 25 minutes   Queena Monrreal, [place]  [facility] Pager [phone]. If 7PM-7AM, please contact night-coverage at [email or web address], password [facility] 06/02/2015, 8:15 AM  LOS: 3 days

## 2015-06-02 NOTE — Care Management Important Message (Signed)
Important Message  Patient Details  Name: Ayani Hackl MRN: [id number] Date of Birth: May 10, 1961   Medicare Important Message Given:  Yes    Camillo Flaming 06/02/2015, 12:15 [place] Message  Patient Details  Name: Jimi Teets MRN: [id number] Date of Birth: Oct 18, 1960   Medicare Important Message Given:  Yes    Camillo Flaming 06/02/2015, 12:15 PM

## 2015-06-03 DIAGNOSIS — N179 Acute kidney failure, unspecified: Secondary | ICD-10-CM | POA: Diagnosis present

## 2015-06-03 DIAGNOSIS — G894 Chronic pain syndrome: Secondary | ICD-10-CM

## 2015-06-03 DIAGNOSIS — E876 Hypokalemia: Secondary | ICD-10-CM

## 2015-06-03 DIAGNOSIS — G9340 Encephalopathy, unspecified: Secondary | ICD-10-CM | POA: Diagnosis present

## 2015-06-03 DIAGNOSIS — N390 Urinary tract infection, site not specified: Secondary | ICD-10-CM

## 2015-06-03 LAB — BASIC METABOLIC PANEL
ANION GAP: 9 (ref 5–15)
BUN: 13 mg/dL (ref 6–20)
CHLORIDE: 108 mmol/L (ref 101–111)
CO2: 24 mmol/L (ref 22–32)
Calcium: 8.8 mg/dL — ABNORMAL LOW (ref 8.9–10.3)
Creatinine, Ser: 0.81 mg/dL (ref 0.44–1.00)
GFR calc Af Amer: >60 mL/min (ref >60)
GFR calc non Af Amer: >60 mL/min (ref >60)
Glucose, Bld: 108 mg/dL — ABNORMAL HIGH (ref 65–99)
POTASSIUM: 3.1 mmol/L — AB (ref 3.5–5.1)
SODIUM: 141 mmol/L (ref 135–145)

## 2015-06-03 LAB — CBC
HEMATOCRIT: 29.5 % — AB (ref 36.0–46.0)
HEMOGLOBIN: 9.9 g/dL — AB (ref 12.0–15.0)
MCH: 28 pg (ref 26.0–34.0)
MCHC: 33.6 g/dL (ref 30.0–36.0)
MCV: 83.3 fL (ref 78.0–100.0)
Platelets: 228 10*3/uL (ref 150–400)
RBC: 3.54 MIL/uL — AB (ref 3.87–5.11)
RDW: 16 % — ABNORMAL HIGH (ref 11.5–15.5)
WBC: 10.1 10*3/uL (ref 4.0–10.5)

## 2015-06-03 LAB — VANCOMYCIN, TROUGH: Vancomycin Tr: 14 ug/mL (ref 10.0–20.0)

## 2015-06-03 MED ORDER — POTASSIUM CHLORIDE CRYS ER 20 MEQ PO TBCR
40.0000 meq | EXTENDED_RELEASE_TABLET | Freq: Once | ORAL | Status: AC
  Administered 2015-06-03: 40 meq via ORAL
  Filled 2015-06-03: qty 2

## 2015-06-03 MED ORDER — FUROSEMIDE 80 MG PO TABS: 80.0000 mg | ORAL_TABLET | Freq: Two times a day (BID) | ORAL | Status: DC

## 2015-06-03 MED ORDER — CIPROFLOXACIN HCL 500 MG PO TABS: 500.0000 mg | ORAL_TABLET | Freq: Two times a day (BID) | ORAL | Status: DC

## 2015-06-03 NOTE — Progress Notes (Signed)
CSW contacted Tammy Programmer, multimedia for [employer]) and Pt is ok to transfer to facility over the weekend.   MD will need to update the D/C summary if there are any changes with Pt status or medications. (per facility)  [facility]  [phone]

## 2015-06-03 NOTE — Evaluation (Signed)
Clinical/Bedside Swallow Evaluation Patient Details  Name: Shannon Garrett MRN: [id number] Date of Birth: 12-Apr-1961  Today's Date: 06/03/2015 Time: SLP Start Time (ACUTE ONLY): [id number] SLP Stop Time (ACUTE ONLY): 1010 SLP Time Calculation (min) (ACUTE ONLY): 31 min  Past Medical History:  Past Medical History  Diagnosis Date  . MS (multiple sclerosis) ([place])     a. Dx'd late 20's. b. Tx with Novantrone, Tysabri, Copaxone previously.  Marland Kitchen Hypertension   . Depression   . Glaucoma   . Cardiomyopathy ([place])     a.  Echo 04/29/12: Mild LVH, EF 20-25%, mild AI, moderate MR, moderate LAE, mild RAE, mild RVE, moderate TR, PASP 51, small pericardial effusion;   b. probably non-ischemic given multiple chemo-Tx agents used for MS and global LV dysfn on echo  . Chronic systolic heart failure ([place])   . CHF (congestive heart failure) ([place])   . Shortness of breath   . Neuromuscular disorder [facility])     MS   Past Surgical History:  Past Surgical History  Procedure Laterality Date  . Ablation      uterine  . Cesarean section     HPI:  54 yo female adm to [facility] with AMS, encephalopathy, ? UTI.  Pt has MS and resides at [facility].  Swallow evaluation ordered.  CXR 05/30/15 negative, multiple CXRs reviewed by SLP dating back to 2013 and were negative.     Assessment / Plan / Recommendation Clinical Impression  Pt presents with mild oral dysphagia likely due to her mental status and generalized weakness.  No indications of aspiration with po intake observed.  Slow but effective mastication noted.   Pt able to self feed with effort.  Pt noted to lean to the right which she reports to be baseline - SLP assisted to reposition pt.   Ms Bath admits to baseline dysphagia due to her MS resulting in occasional coughing with water.  Review of all CXRs completed - that were all negative.  Reviwed aspiration precautions with pt to mitigate her dysphagia and aspiration risk.  Recommend continue regular/thin diet with general  precautions.       Aspiration Risk  Mild aspiration risk    Diet Recommendation Regular;Thin liquid   Liquid Administration via: Cup;Straw Medication Administration: Whole meds with puree Supervision: Patient able to self feed Compensations: Small sips/bites;Slow rate    Other  Recommendations Oral Care Recommendations: Oral care BID   Follow up Recommendations       Frequency and Duration            Prognosis        Swallow Study   General Date of Onset: 06/03/15 HPI: 54 yo female adm to [facility] with AMS, encephalopathy, ? UTI.  Pt has MS and resides at [facility].  Swallow evaluation ordered.  CXR 05/30/15 negative, multiple CXRs reviewed by SLP dating back to 2013 and were negative.   Type of Study: Bedside Swallow Evaluation Diet Prior to this Study: Regular;Thin liquids Temperature Spikes Noted: No Respiratory Status: Room air History of Recent Intubation: No Behavior/Cognition: Alert;Cooperative Oral Cavity Assessment: Within Functional Limits Oral Care Completed by SLP: No Oral Cavity - Dentition: Adequate natural dentition Vision: Functional for self-feeding Self-Feeding Abilities: Able to feed self Patient Positioning: Upright in bed Baseline Vocal Quality: Normal Volitional Cough: Strong Volitional Swallow: Able to elicit    Oral/Motor/Sensory Function Overall Oral Motor/Sensory Function: Generalized oral weakness (pt leaning to the right - reports this to be baseline)   Ice Chips Ice chips:  Not tested   Thin Liquid Thin Liquid: Within functional limits Presentation: Straw    Nectar Thick Nectar Thick Liquid: Not tested   Honey Thick Honey Thick Liquid: Not tested   Puree Puree: Within functional limits Presentation: Spoon;Self Fed   Solid Other Comments: slow but effective mastication       Janett Labella [place], [place] [facility] SLP [phone]

## 2015-06-03 NOTE — Discharge Summary (Signed)
Physician Discharge Summary  Shannon Garrett [id number] DOB: 1961/02/22 DOA: 05/30/2015  PCP: Hennie Duos, MD  Admit date: 05/30/2015 Discharge date: 06/04/2015  Time spent: 35 minutes  Recommendations for Outpatient Follow-up:  1. Discharged to skilled nursing facility. Patient completed total 10 day course of antibiotic on 12/8. Please introduce her pain medications slowly. (Have discontinued upon discharge)   Discharge Diagnoses:  Principal Problem:   Sepsis secondary to UTI [facility])   Active Problems:   MULTIPLE SCLEROSIS, PROGRESSIVE/RELAPSING   Depression   Essential hypertension   Chronic pain syndrome   Hypernatremia   Dehydration   Chronic anemia   Acute encephalopathy   Acute kidney injury ([place])   Hypokalemia   Discharge Condition: Fair  Diet recommendation: Heart healthy CODE STATUS: Full code  [facility] Weights   05/31/15 0322  Weight: 98.1 kg (216 lb 4.3 oz)    History of present illness:  Please refer to admission H&P for details, in brief,54 year old female with history of advanced multiple sclerosis, hypertension, depression, glaucoma, systolic CHF, chronic pain syndrome and chronic anemia , resident of [facility] living [place] skilled nursing facility was brought in by EMS for change in her mental status and lethargy for past 2 days. Patient baseline is nonambulatory but will oriented and interactive and well. Patient septic on admission with tachypnea , fever of 101 per EMS, acute encephalopathy and acute kidney injury with workup suggestive of UTI. Admitted to stepdown unit.  Hospital Course:  Sepsis secondary to UTI Patient admitted to stepdown and given aggressive IV hydration. Placed on empiric Rocephin and vancomycin (given history of enterococcus). Urine culture with mixed bacteria. . Leukocytosis and fever improved. Mental status much improved  and answering appropriately. Antibiotic narrowed to IV Rocephin. Will discharge her on oral  ciprofloxacin to complete a total 10 day course of antibiotic. Stop date 06/09/2015.   Acute kidney injury Possibly severe to sepsis from UTI and dehydration. Improving with aggressive hydration. Held lasix and aldactone. Will resume upon discharge.  Acute metabolic encephalopathy Secondary to sepsis. on multiple meds including baclofen, Neurontin, antidepressants and Ambien which are held. Mental status much improved  and patient also to communicate is will oriented and answering questions appropriately.  Uncontrolled hypertension Continue Coreg. BP stable  Received when necessary hydralazine while in the hospital.  Multiple sclerosis, progressive since relapsing Resume baclofen upon discharge. Continue amantadine. Continue monthly injections as outpatient.  Depression Resume cymbalta and imipramine.  Chronic pain syndrome Holding analgesics given her encephalopathy. Has not required medications while in the hospital. Resume as outpatient once fully recovered.  Hypokalemia Replenished  Protein calorie malnutrition Added supplement  Anemia of chronic disease Hemoglobin stable.  Family Communication: None at bedside Disposition Plan: Discharge to skilled nursing facility   Consultants:  None  Procedures:  None  Antibiotics:  IV vancomycin and Rocephin  Discharge Exam: Filed Vitals:   06/02/15 2100 06/03/15 0500  BP: 130/77 152/90  Pulse: 101 98  Temp: 100.2 F (37.9 C) 99.2 F (37.3 C)  Resp: 20 20    General: Middle aged female not in distress  HEENT: Moist oral mucosa  Chest: Clear to auscultation bilaterally, no added sounds  CVS: Normal S1 and S2, no murmurs or gallop  GI: Soft, nondistended, nontender, bowel sounds present  Musculoskeletal: Warm, no edema  CNS: Awake and alert, slow communication but much better oriented.  Discharge Instructions    Current Discharge Medication List    START taking these medications   Details   ciprofloxacin (CIPRO) 500  MG tablet Take 1 tablet (500 mg total) by mouth 2 (two) times daily. Qty: 12 tablet, Refills: 0      CONTINUE these medications which have CHANGED   Details  furosemide (LASIX) 80 MG tablet Take 1 tablet (80 mg total) by mouth 2 (two) times daily. Takes 160mg  in the morning and 80mg  at bedtime Qty: 30 tablet, Refills: 0      CONTINUE these medications which have NOT CHANGED   Details  amantadine (SYMMETREL) 100 MG capsule Take 100 mg by mouth daily.     baclofen (LIORESAL) 20 MG tablet Take 20 mg by mouth 3 (three) times daily.    carvedilol (COREG) 12.5 MG tablet Take 1 tablet (12.5 mg total) by mouth 2 (two) times daily with a meal.    Cholecalciferol (VITAMIN D-3 PO) Take 1 tablet by mouth daily.    CRANBERRY PO Take 100 mg by mouth daily.    Cyanocobalamin (VITAMIN B-12 PO) Take 1 tablet by mouth daily.    docusate sodium (COLACE) 100 MG capsule Take 100 mg by mouth 2 (two) times daily.    DULoxetine (CYMBALTA) 60 MG capsule Take 60 mg by mouth daily.    Fluocinolone Acetonide (DERMA-SMOOTHE/FS SCALP) 0.01 % OIL Apply 1 application topically every Wednesday.    gabapentin (NEURONTIN) 300 MG capsule Take 3 capsules (900 mg total) by mouth 3 (three) times daily. Qty: 270 capsule, Refills: 5    imipramine (TOFRANIL) 50 MG tablet Take 1.5 tablets (75 mg total) by mouth every evening. For depressive disorder Qty: 90 tablet, Refills: 11   Associated Diagnoses: Chronic pain    latanoprost (XALATAN) 0.005 % ophthalmic solution Place 1 drop into both eyes at bedtime.    magnesium hydroxide (MILK OF MAGNESIA) 400 MG/5ML suspension Take 30 mLs by mouth every 8 (eight) hours as needed for mild constipation or moderate constipation.    natalizumab (TYSABRI) 300 MG/15ML injection Inject 300 mg into the vein every 30 (thirty) days.    phenazopyridine (PYRIDIUM) 100 MG tablet Take 100 mg by mouth 3 (three) times daily.     polyethylene glycol (MIRALAX /  GLYCOLAX) packet Take 17 g by mouth 2 (two) times daily. Qty: 120 packet, Refills: 11   Associated Diagnoses: Chronic constipation    potassium chloride (KLOR-CON) 20 MEQ packet Take 20 mEq by mouth daily.    Propylene Glycol (SYSTANE BALANCE) 0.6 % SOLN Place 1 drop into both eyes 2 (two) times daily.    solifenacin (VESICARE) 10 MG tablet Take 10 mg by mouth daily. For bladder spasms    spironolactone (ALDACTONE) 25 MG tablet Take 25 mg by mouth daily. For CHF    tamsulosin (FLOMAX) 0.4 MG CAPS capsule Take 0.4 mg by mouth daily. For incontinence      STOP taking these medications     oxyCODONE-acetaminophen (PERCOCET) 7.5-325 MG per tablet      zolpidem (AMBIEN) 10 MG tablet        Allergies  Allergen Reactions  . Sulfonamide Derivatives Hives and Shortness Of Breath  . Penicillins Hives and Itching    Has patient had a PCN reaction causing immediate rash, facial/tongue/throat swelling, SOB or lightheadedness with hypotension: unknown Has patient had a PCN reaction causing severe rash involving mucus membranes or skin necrosis: unknown Has patient had a PCN reaction that required hospitalization: unknown Has patient had a PCN reaction occurring within the last 10 years: unknown If all of the above answers are "NO", then may proceed with Cephalosporin use.  Follow-up Information    Please follow up.   Why:  MD at SNF in 1 week       The results of significant diagnostics from this hospitalization (including imaging, microbiology, ancillary and laboratory) are listed below for reference.    Significant Diagnostic Studies: Dg Chest Port 1 View  05/30/2015  CLINICAL DATA:  Lethargy and altered mental status, 2 days duration. Febrile. EXAM: PORTABLE CHEST 1 VIEW COMPARISON:  01/10/2015 FINDINGS: A single AP portable view of the chest demonstrates no focal airspace consolidation or alveolar edema. The lungs are grossly clear. There is no large effusion or pneumothorax.  Cardiac and mediastinal contours appear unremarkable. IMPRESSION: No active disease. Electronically Signed   By: Andreas Newport M.D.   On: 05/30/2015 21:35    Microbiology: Recent Results (from the past 240 hour(s))  Culture, blood (routine x 2)     Status: None (Preliminary result)   Collection Time: 05/30/15  8:15 PM  Result Value Ref Range Status   Specimen Description BLOOD LEFT HAND  Final   Special Requests BOTTLES DRAWN AEROBIC AND ANAEROBIC 5CC  Final   Culture   Final    NO GROWTH 3 DAYS Performed at [facility]    Report Status PENDING  Incomplete  Urine culture     Status: None   Collection Time: 05/30/15  9:17 PM  Result Value Ref Range Status   Specimen Description URINE, CATHETERIZED  Final   Special Requests NONE  Final   Culture   Final    MULTIPLE SPECIES PRESENT, SUGGEST RECOLLECTION Performed at [facility]    Report Status 06/02/2015 FINAL  Final  Culture, blood (routine x 2)     Status: None (Preliminary result)   Collection Time: 05/30/15  9:40 PM  Result Value Ref Range Status   Specimen Description BLOOD RIGHT ANTECUBITAL  Final   Special Requests BOTTLES DRAWN AEROBIC AND ANAEROBIC 5CC  Final   Culture   Final    NO GROWTH 3 DAYS Performed at [facility]    Report Status PENDING  Incomplete  MRSA PCR Screening     Status: None   Collection Time: 05/31/15  4:30 AM  Result Value Ref Range Status   MRSA by PCR NEGATIVE NEGATIVE Final    Comment:        The GeneXpert MRSA Assay (FDA approved for NASAL specimens only), is one component of a comprehensive MRSA colonization surveillance program. It is not intended to diagnose MRSA infection nor to guide or monitor treatment for MRSA infections.      Labs: Basic Metabolic Panel:  Recent Labs Lab 05/30/15 2140 05/31/15 0430 06/01/15 0340 06/02/15 0347 06/03/15 0515  NA 147* 145 145 150* 141  K 3.8 3.4* 3.7 3.5 3.1*  CL 105 103 108 114* 108  CO2 31 35* 28 25 24    GLUCOSE 101* 119* 133* 119* 108*  BUN 18 18 20 18 13   CREATININE 1.06* 1.28* 1.10* 0.99 0.81  CALCIUM 9.7 9.6 9.3 9.1 8.8*   Liver Function Tests:  Recent Labs Lab 05/30/15 2024 05/30/15 2140 05/31/15 0430 06/01/15 0340 06/02/15 0347  AST 15 18 15 25 21   ALT 14 13* 14 23 20   ALKPHOS 140* 135* 147* 126 123  BILITOT 0.7 0.6 0.7 0.9 0.8  PROT 7.0 7.3 7.5 7.1 6.5  ALBUMIN 3.9 4.0 3.9 3.6 3.3*   No results for input(s): LIPASE, AMYLASE in the last 168 hours. No results for input(s): AMMONIA in the last  168 hours. CBC:  Recent Labs Lab 05/30/15 2140 05/31/15 0430 06/01/15 0340 06/02/15 0347 06/03/15 0515  WBC 7.6 7.2 10.7* 12.2* 10.1  NEUTROABS 4.3 4.0  --   --   --   HGB 10.9* 10.9* 10.4* 10.4* 9.9*  HCT 33.5* 32.9* 31.9* 32.0* 29.5*  MCV 84.6 86.6 84.2 84.7 83.3  PLT 290 275 268 257 228   Cardiac Enzymes: No results for input(s): CKTOTAL, CKMB, CKMBINDEX, TROPONINI in the last 168 hours. BNP: BNP (last 3 results) No results for input(s): BNP in the last 8760 hours.  ProBNP (last 3 results) No results for input(s): PROBNP in the last 8760 hours.  CBG:  Recent Labs Lab 05/30/15 1950  GLUCAP 101*       Signed:  Barby Colvard  [facility] 06/03/2015, 1:48 PM

## 2015-06-03 NOTE — Progress Notes (Signed)
[facility] PROGRESS NOTE  Shannon Garrett [id number] DOB: May 15, 1961 DOA: 05/30/2015 PCP: Hennie Duos, MD  Brief narrative 55 year old female with history of advanced multiple sclerosis, hypertension, depression, glaucoma, systolic CHF, chronic pain syndrome and chronic anemia , resident of [facility] living [place] skilled nursing facility was brought in by EMS for change in her mental status and lethargy for past 2 days. Patient baseline is nonambulatory but will oriented and interactive and well. Patient septic on admission with tachypnea , fever of 101 per EMS, acute encephalopathy and acute kidney injury with workup suggestive of UTI. Admitted to stepdown unit.   Assessment/Plan: Sepsis secondary to UTI Continue IV hydration. Empiric Rocephin and vancomycin (given history of enterococcus). Urine culture with mixed bacteria. Has low-grade temperature. Leukocytosis improved. Mental status much improved today and answering appropriately. We will narrow antibiotic to IV Rocephin only.  Acute kidney injury Possibly severe to sepsis from UTI and dehydration. Improving with aggressive hydration. Held lasix and aldactone.  Acute metabolic encephalopathy Secondary to sepsis. on multiple meds including baclofen, Neurontin, antidepressants and Ambien which are held. Mental status much improved today and patient also to communicate is will oriented and answering questions appropriately.  Uncontrolled hypertension Continue Coreg. BP stable  Added when necessary hydralazine.  Multiple sclerosis, progressive since relapsing Resume baclofen upon discharge. Continue amantadine. Continue monthly injections as outpatient.  Depression Resume cymbalta and imipramine.  Chronic pain syndrome Holding analgesics given her encephalopathy. Resume once improved.  Hypokalemia Replenished  Protein  calorie malnutrition Added supplement  Anemia of chronic disease  Code Status: Full  code Family Communication: None at bedside Disposition Plan: Discharge to skilled nursing facility tomorrow.   Consultants:  None  Procedures:  None  Antibiotics:  IV vancomycin and Rocephin  HPI/Subjective: Seen and examined. No overnight issues. Patient much oriented to day and communicating well (slow to respond).  Objective: Filed Vitals:   06/02/15 2100 06/03/15 0500  BP: 130/77 152/90  Pulse: 101 98  Temp: 100.2 F (37.9 C) 99.2 F (37.3 C)  Resp: 20 20    Intake/Output Summary (Last 24 hours) at 06/03/15 1326 Last data filed at 06/03/15 [id number]  Gross per 24 hour  Intake    480 ml  Output      5 ml  Net    475 ml   Filed Weights   05/31/15 0322  Weight: 98.1 kg (216 lb 4.3 oz)    Exam:   General:  Middle aged female not in distress  HEENT: Moist oral mucosa  Chest: Clear to auscultation bilaterally, no added sounds  CVS: Normal S1 and S2, no murmurs or gallop  GI: Soft, nondistended, nontender, bowel sounds present  Musculoskeletal: Warm, no edema  CNS: Awake and alert, slow communication but much better oriented.   Data Reviewed: Basic Metabolic Panel:  Recent Labs Lab 05/30/15 2140 05/31/15 0430 06/01/15 0340 06/02/15 0347 06/03/15 0515  NA 147* 145 145 150* 141  K 3.8 3.4* 3.7 3.5 3.1*  CL 105 103 108 114* 108  CO2 31 35* 28 25 24   GLUCOSE 101* 119* 133* 119* 108*  BUN 18 18 20 18 13   CREATININE 1.06* 1.28* 1.10* 0.99 0.81  CALCIUM 9.7 9.6 9.3 9.1 8.8*   Liver Function Tests:  Recent Labs Lab 05/30/15 2024 05/30/15 2140 05/31/15 0430 06/01/15 0340 06/02/15 0347  AST 15 18 15 25 21   ALT 14 13* 14 23 20   ALKPHOS 140* 135* 147* 126 123  BILITOT 0.7 0.6 0.7 0.9 0.8  PROT  7.0 7.3 7.5 7.1 6.5  ALBUMIN 3.9 4.0 3.9 3.6 3.3*   No results for input(s): LIPASE, AMYLASE in the last 168 hours. No results for input(s): AMMONIA in the last 168 hours. CBC:  Recent Labs Lab 05/30/15 2140 05/31/15 0430 06/01/15 0340  06/02/15 0347 06/03/15 0515  WBC 7.6 7.2 10.7* 12.2* 10.1  NEUTROABS 4.3 4.0  --   --   --   HGB 10.9* 10.9* 10.4* 10.4* 9.9*  HCT 33.5* 32.9* 31.9* 32.0* 29.5*  MCV 84.6 86.6 84.2 84.7 83.3  PLT 290 275 268 257 228   Cardiac Enzymes: No results for input(s): CKTOTAL, CKMB, CKMBINDEX, TROPONINI in the last 168 hours. BNP (last 3 results) No results for input(s): BNP in the last 8760 hours.  ProBNP (last 3 results) No results for input(s): PROBNP in the last 8760 hours.  CBG:  Recent Labs Lab 05/30/15 1950  GLUCAP 101*    Recent Results (from the past 240 hour(s))  Culture, blood (routine x 2)     Status: None (Preliminary result)   Collection Time: 05/30/15  8:15 PM  Result Value Ref Range Status   Specimen Description BLOOD LEFT HAND  Final   Special Requests BOTTLES DRAWN AEROBIC AND ANAEROBIC 5CC  Final   Culture   Final    NO GROWTH 2 DAYS Performed at [facility]    Report Status PENDING  Incomplete  Urine culture     Status: None   Collection Time: 05/30/15  9:17 PM  Result Value Ref Range Status   Specimen Description URINE, CATHETERIZED  Final   Special Requests NONE  Final   Culture   Final    MULTIPLE SPECIES PRESENT, SUGGEST RECOLLECTION Performed at [facility]    Report Status 06/02/2015 FINAL  Final  Culture, blood (routine x 2)     Status: None (Preliminary result)   Collection Time: 05/30/15  9:40 PM  Result Value Ref Range Status   Specimen Description BLOOD RIGHT ANTECUBITAL  Final   Special Requests BOTTLES DRAWN AEROBIC AND ANAEROBIC 5CC  Final   Culture   Final    NO GROWTH 2 DAYS Performed at [facility]    Report Status PENDING  Incomplete  MRSA PCR Screening     Status: None   Collection Time: 05/31/15  4:30 AM  Result Value Ref Range Status   MRSA by PCR NEGATIVE NEGATIVE Final    Comment:        The GeneXpert MRSA Assay (FDA approved for NASAL specimens only), is one component of a comprehensive MRSA  colonization surveillance program. It is not intended to diagnose MRSA infection nor to guide or monitor treatment for MRSA infections.      Studies: No results found.  Scheduled Meds: . amantadine  100 mg Oral Daily  . carvedilol  12.5 mg Oral BID WC  . cefTRIAXone (ROCEPHIN)  IV  1 g Intravenous Q24H  . DULoxetine  60 mg Oral Daily  . enoxaparin (LOVENOX) injection  40 mg Subcutaneous Q24H  . feeding supplement (ENSURE ENLIVE)  237 mL Oral BID BM  . free water  200 mL Per Tube 3 times per day  . imipramine  75 mg Oral QPM  . multivitamin with minerals  1 tablet Oral Daily  . tamsulosin  0.4 mg Oral Daily  . vancomycin  1,500 mg Intravenous Q24H   Continuous Infusions: . dextrose 75 mL/hr at 06/02/15 2236      Time spent: 25 minutes  Louellen Molder  [facility] Pager [phone]. If 7PM-7AM, please contact night-coverage at [email or web address], password [email or web address] 06/03/2015, 1:26 PM  LOS: 4 days

## 2015-06-03 NOTE — Progress Notes (Signed)
ANTIBIOTIC CONSULT NOTE - Follow-up  Pharmacy Consult for Vancomyin  Indication: sepsis/UTI  Allergies  Allergen Reactions  . Sulfonamide Derivatives Hives and Shortness Of Breath  . Penicillins Hives and Itching    Has patient had a PCN reaction causing immediate rash, facial/tongue/throat swelling, SOB or lightheadedness with hypotension: unknown Has patient had a PCN reaction causing severe rash involving mucus membranes or skin necrosis: unknown Has patient had a PCN reaction that required hospitalization: unknown Has patient had a PCN reaction occurring within the last 10 years: unknown If all of the above answers are "NO", then may proceed with Cephalosporin use.     Patient Measurements: Height: 5\' 5"  (165.1 cm) Weight: 216 lb 4.3 oz (98.1 kg) IBW/kg (Calculated) : 57 Adjusted Body Weight:   Vital Signs: Temp: 99.2 F (37.3 C) (12/02 0500) Temp Source: Axillary (12/02 0500) BP: 152/90 mmHg (12/02 0500) Pulse Rate: 98 (12/02 0500) Intake/Output from previous day: 12/01 0701 - 12/02 0700 In: 630 [P.O.:480; I.V.:150] Out: 7 [Urine:4; Stool:3] Intake/Output from this shift: Total I/O In: -  Out: 4 [Urine:2; Stool:2]  Labs:  Recent Labs  06/01/15 0340 06/02/15 0347 06/03/15 0515  WBC 10.7* 12.2* 10.1  HGB 10.4* 10.4* 9.9*  PLT 268 257 228  CREATININE 1.10* 0.99  --    Estimated Creatinine Clearance: 77 mL/min (by C-G formula based on Cr of 0.99).  Recent Labs  06/03/15 0515  [place] 14     Microbiology: Recent Results (from the past 720 hour(s))  Culture, blood (routine x 2)     Status: None (Preliminary result)   Collection Time: 05/30/15  8:15 PM  Result Value Ref Range Status   Specimen Description BLOOD LEFT HAND  Final   Special Requests BOTTLES DRAWN AEROBIC AND ANAEROBIC 5CC  Final   Culture   Final    NO GROWTH 2 DAYS Performed at [facility]    Report Status PENDING  Incomplete  Urine culture     Status: None   Collection  Time: 05/30/15  9:17 PM  Result Value Ref Range Status   Specimen Description URINE, CATHETERIZED  Final   Special Requests NONE  Final   Culture   Final    MULTIPLE SPECIES PRESENT, SUGGEST RECOLLECTION Performed at [facility]    Report Status 06/02/2015 FINAL  Final  Culture, blood (routine x 2)     Status: None (Preliminary result)   Collection Time: 05/30/15  9:40 PM  Result Value Ref Range Status   Specimen Description BLOOD RIGHT ANTECUBITAL  Final   Special Requests BOTTLES DRAWN AEROBIC AND ANAEROBIC 5CC  Final   Culture   Final    NO GROWTH 2 DAYS Performed at [facility]    Report Status PENDING  Incomplete  MRSA PCR Screening     Status: None   Collection Time: 05/31/15  4:30 AM  Result Value Ref Range Status   MRSA by PCR NEGATIVE NEGATIVE Final    Comment:        The GeneXpert MRSA Assay (FDA approved for NASAL specimens only), is one component of a comprehensive MRSA colonization surveillance program. It is not intended to diagnose MRSA infection nor to guide or monitor treatment for MRSA infections.     Medications:  Anti-infectives    Start     Dose/Rate Route Frequency Ordered Stop   06/01/15 0600  vancomycin (VANCOCIN) 1,500 mg in sodium chloride 0.9 % 500 mL IVPB     1,500 mg 250 mL/hr over  120 Minutes Intravenous Every 24 hours 05/31/15 0856     05/31/15 2200  levofloxacin (LEVAQUIN) IVPB 750 mg  Status:  Discontinued     750 mg 100 mL/hr over 90 Minutes Intravenous Every 24 hours 05/31/15 0334 05/31/15 1216   05/31/15 1300  cefTRIAXone (ROCEPHIN) 1 g in dextrose 5 % 50 mL IVPB     1 g 100 mL/hr over 30 Minutes Intravenous Every 24 hours 05/31/15 1216     05/31/15 0800  vancomycin (VANCOCIN) 1,250 mg in sodium chloride 0.9 % 250 mL IVPB  Status:  Discontinued     1,250 mg 166.7 mL/hr over 90 Minutes Intravenous Every 12 hours 05/31/15 0316 05/31/15 0856   05/30/15 2330  vancomycin (VANCOCIN) IVPB 1000 mg/200 mL premix  Status:   Discontinued     1,000 mg 200 mL/hr over 60 Minutes Intravenous Every 12 hours 05/30/15 2319 05/31/15 0316   05/30/15 2230  levofloxacin (LEVAQUIN) IVPB 750 mg     750 mg 100 mL/hr over 90 Minutes Intravenous  Once 05/30/15 2215 05/31/15 0120   05/30/15 2130  fluconazole (DIFLUCAN) tablet 150 mg  Status:  Discontinued     150 mg Oral  Once 05/30/15 2119 05/30/15 2123     Assessment: Patient with sepsis and UTI.  Patient with Enterococcus UTI history.    11/29  >> levofloxacin  >> 11/30  >> vancomycin >>    July 2016 urine cx: enterococcus: amp sensitive, quinolone resistant 11/28 blood: 11/28 urine:   Renal: Scr trending up - concern SCr overestimates actual renal fx d/t non-ambulatory state  12/2 0515 VT=14 mcg/ml (at goal)  Goal of Therapy:  Vancomycin trough level 10-15 mcg/ml for UTI  Plan:   Continue Vancomycin 1500mg  q24h  Adjust antibiotics according to pending culture results   Lawana Pai R] 06/03/2015 6:45 AM

## 2015-06-04 DIAGNOSIS — A419 Sepsis, unspecified organism: Principal | ICD-10-CM

## 2015-06-04 DIAGNOSIS — E876 Hypokalemia: Secondary | ICD-10-CM

## 2015-06-04 NOTE — Progress Notes (Signed)
Called report to [facility] LPN at [employer].  Patient left hospital via EMS. Virginia Rochester, RN

## 2015-06-04 NOTE — Progress Notes (Signed)
[facility] PROGRESS NOTE  Shannon Garrett [id number] DOB: 01-Jan-1961 DOA: 05/30/2015 PCP: Hennie Duos, MD  Assessment/Plan: Sepsis secondary to UTI Patient admitted to stepdown and given aggressive IV hydration. Placed on empiric Rocephin and vancomycin (given history of enterococcus). Urine culture with mixed bacteria. . Leukocytosis and fever improved. Mental status much improved and answering appropriately. Antibiotic narrowed to IV Rocephin. Will discharge her on oral ciprofloxacin to complete a total 10 day course of antibiotic. Stop date 06/09/2015.   Acute kidney injury Possibly severe to sepsis from UTI and dehydration. Improving with aggressive hydration. Held lasix and aldactone.  resume upon discharge.  Acute metabolic encephalopathy Secondary to sepsis. on multiple meds including baclofen, Neurontin, antidepressants and Ambien which are held. Mental status improved to baseline  Uncontrolled hypertension Continue Coreg. BP stable.  Multiple sclerosis, progressive since relapsing Resume baclofen Continue amantadine. Continue monthly injections as outpatient.  Depression Resume cymbalta and imipramine.  Chronic pain syndrome Holding analgesics given her encephalopathy. Has not required medications while in the hospital. Resume as outpatient once fully recovered.    Family Communication: None at bedside Disposition Plan: Discharge to skilled nursing facility   Consultants:  None  Procedures:  None  Antibiotics:  IV vancomycin and Rocephin    HPI/Subjective: No overnight issues. C/o some bladder pain on urination  Objective: Filed Vitals:   06/03/15 2056 06/04/15 0551  BP: 149/92 151/81  Pulse: 99 92  Temp: 98.9 F (37.2 C) 98.4 F (36.9 C)  Resp: 18 18    Intake/Output Summary (Last 24 hours) at 06/04/15 0945 Last data filed at 06/04/15 0450  Gross per 24 hour  Intake   1643 ml  Output      1 ml  Net   1642 ml   Filed Weights    05/31/15 0322  Weight: 98.1 kg (216 lb 4.3 oz)    Exam:   General: not in distress  HEENT: Moist oral mucosa  Chest: Clear to auscultation bilaterally,  CVS: Normal S1 and S2,   GI: Soft, nondistended, nontender, bowel sounds present  CNS: Awake and alert,   Data Reviewed: Basic Metabolic Panel:  Recent Labs Lab 05/30/15 2140 05/31/15 0430 06/01/15 0340 06/02/15 0347 06/03/15 0515  NA 147* 145 145 150* 141  K 3.8 3.4* 3.7 3.5 3.1*  CL 105 103 108 114* 108  CO2 31 35* 28 25 24   GLUCOSE 101* 119* 133* 119* 108*  BUN 18 18 20 18 13   CREATININE 1.06* 1.28* 1.10* 0.99 0.81  CALCIUM 9.7 9.6 9.3 9.1 8.8*   Liver Function Tests:  Recent Labs Lab 05/30/15 2024 05/30/15 2140 05/31/15 0430 06/01/15 0340 06/02/15 0347  AST 15 18 15 25 21   ALT 14 13* 14 23 20   ALKPHOS 140* 135* 147* 126 123  BILITOT 0.7 0.6 0.7 0.9 0.8  PROT 7.0 7.3 7.5 7.1 6.5  ALBUMIN 3.9 4.0 3.9 3.6 3.3*   No results for input(s): LIPASE, AMYLASE in the last 168 hours. No results for input(s): AMMONIA in the last 168 hours. CBC:  Recent Labs Lab 05/30/15 2140 05/31/15 0430 06/01/15 0340 06/02/15 0347 06/03/15 0515  WBC 7.6 7.2 10.7* 12.2* 10.1  NEUTROABS 4.3 4.0  --   --   --   HGB 10.9* 10.9* 10.4* 10.4* 9.9*  HCT 33.5* 32.9* 31.9* 32.0* 29.5*  MCV 84.6 86.6 84.2 84.7 83.3  PLT 290 275 268 257 228   Cardiac Enzymes: No results for input(s): CKTOTAL, CKMB, CKMBINDEX, TROPONINI in the last 168 hours.  BNP (last 3 results) No results for input(s): BNP in the last 8760 hours.  ProBNP (last 3 results) No results for input(s): PROBNP in the last 8760 hours.  CBG:  Recent Labs Lab 05/30/15 1950  GLUCAP 101*    Recent Results (from the past 240 hour(s))  Culture, blood (routine x 2)     Status: None (Preliminary result)   Collection Time: 05/30/15  8:15 PM  Result Value Ref Range Status   Specimen Description BLOOD LEFT HAND  Final   Special Requests BOTTLES DRAWN AEROBIC  AND ANAEROBIC 5CC  Final   Culture   Final    NO GROWTH 3 DAYS Performed at [facility]    Report Status PENDING  Incomplete  Urine culture     Status: None   Collection Time: 05/30/15  9:17 PM  Result Value Ref Range Status   Specimen Description URINE, CATHETERIZED  Final   Special Requests NONE  Final   Culture   Final    MULTIPLE SPECIES PRESENT, SUGGEST RECOLLECTION Performed at [facility]    Report Status 06/02/2015 FINAL  Final  Culture, blood (routine x 2)     Status: None (Preliminary result)   Collection Time: 05/30/15  9:40 PM  Result Value Ref Range Status   Specimen Description BLOOD RIGHT ANTECUBITAL  Final   Special Requests BOTTLES DRAWN AEROBIC AND ANAEROBIC 5CC  Final   Culture   Final    NO GROWTH 3 DAYS Performed at [facility]    Report Status PENDING  Incomplete  MRSA PCR Screening     Status: None   Collection Time: 05/31/15  4:30 AM  Result Value Ref Range Status   MRSA by PCR NEGATIVE NEGATIVE Final    Comment:        The GeneXpert MRSA Assay (FDA approved for NASAL specimens only), is one component of a comprehensive MRSA colonization surveillance program. It is not intended to diagnose MRSA infection nor to guide or monitor treatment for MRSA infections.      Studies: No results found.  Scheduled Meds: . amantadine  100 mg Oral Daily  . carvedilol  12.5 mg Oral BID WC  . cefTRIAXone (ROCEPHIN)  IV  1 g Intravenous Q24H  . DULoxetine  60 mg Oral Daily  . enoxaparin (LOVENOX) injection  40 mg Subcutaneous Q24H  . feeding supplement (ENSURE ENLIVE)  237 mL Oral BID BM  . free water  200 mL Per Tube 3 times per day  . imipramine  75 mg Oral QPM  . multivitamin with minerals  1 tablet Oral Daily  . tamsulosin  0.4 mg Oral Daily   Continuous Infusions: . dextrose 75 mL/hr at 06/04/15 0450      Time spent: 20 minutes    Shannon Garrett, [place]  [facility] Pager [phone]. If 7PM-7AM, please contact  night-coverage at [email or web address], password [facility] 06/04/2015, 9:45 AM  LOS: 5 days

## 2015-06-04 NOTE — Clinical Social Work Note (Signed)
CSW coordinated discharge back to [place].  CSW met with pt and she wants to be transported by ambulance.   Discharge packet provided to RN and PTAR called.  Dede Query, LCSW [place] Worker - Weekend Coverage cell #: [phone]

## 2015-06-05 LAB — CULTURE, BLOOD (ROUTINE X 2)
CULTURE: NO GROWTH
CULTURE: NO GROWTH

## 2015-06-06 ENCOUNTER — Non-Acute Institutional Stay (SKILLED_NURSING_FACILITY): Payer: Medicare Other | Admitting: Internal Medicine

## 2015-06-06 ENCOUNTER — Encounter: Payer: Self-pay | Admitting: Internal Medicine

## 2015-06-06 DIAGNOSIS — D649 Anemia, unspecified: Secondary | ICD-10-CM | POA: Diagnosis not present

## 2015-06-06 DIAGNOSIS — F32A Depression, unspecified: Secondary | ICD-10-CM

## 2015-06-06 DIAGNOSIS — G9340 Encephalopathy, unspecified: Secondary | ICD-10-CM | POA: Diagnosis not present

## 2015-06-06 DIAGNOSIS — A419 Sepsis, unspecified organism: Secondary | ICD-10-CM | POA: Diagnosis not present

## 2015-06-06 DIAGNOSIS — G35 Multiple sclerosis: Secondary | ICD-10-CM

## 2015-06-06 DIAGNOSIS — N390 Urinary tract infection, site not specified: Secondary | ICD-10-CM

## 2015-06-06 DIAGNOSIS — N319 Neuromuscular dysfunction of bladder, unspecified: Secondary | ICD-10-CM

## 2015-06-06 DIAGNOSIS — F329 Major depressive disorder, single episode, unspecified: Secondary | ICD-10-CM | POA: Diagnosis not present

## 2015-06-06 DIAGNOSIS — N179 Acute kidney failure, unspecified: Secondary | ICD-10-CM

## 2015-06-06 NOTE — Progress Notes (Signed)
MRN: [id number] Name: Shannon Garrett  Sex: female Age: 54 y.o. DOB: March 01, 1961  [place] #: Karren Burly Facility/Room:230 Level Of Care: SNF Provider: Inocencio Homes D Emergency Contacts: Extended Emergency Contact Information Primary Emergency Contact: [place], [place] [address] [place] Phone: [phone] Relation: Sister Secondary Emergency Contact: Jefm Bryant,  [place] Phone: [phone] Mobile Phone: [phone] Relation: Friend  Code Status:   Allergies: Sulfonamide derivatives and Penicillins  Chief Complaint  Patient presents with  . Readmit To SNF    HPI: Patient is 54 y.o. female with advanced multiple sclerosis, hypertension, depression, glaucoma, systolic CHF, chronic pain syndrome and chronic anemia , resident of [facility] [place] skilled nursing facility was brought in by EMS for change in her mental status and lethargy for past 2 days.Patient septic on admission with tachypnea , fever of 101 per EMS, acute encephalopathy.Pt was admitted to [facility] from 11/28-12/3 for urosepsis complicated by AKI and hypernatremia. Pt is admitted to SNF for generalized weakness and for residential care for her advanced MS. While at SNF pt will be followed for depression, tx with cymbalta and imipramne, anemia, tx with B12 and neurogenic bladder, tx with flimax and vesicare.  Past Medical History  Diagnosis Date  . MS (multiple sclerosis) ([place])     a. Dx'd late 20's. b. Tx with Novantrone, Tysabri, Copaxone previously.  [place] Kitchen Hypertension   . Depression   . Glaucoma   . Cardiomyopathy ([place])     a.  Echo 04/29/12: Mild LVH, EF 20-25%, mild AI, moderate MR, moderate LAE, mild RAE, mild RVE, moderate TR, PASP 51, small pericardial effusion;   b. probably non-ischemic given multiple chemo-Tx agents used for MS and global LV dysfn on echo  . Chronic systolic heart failure [facility])     Past Surgical History   Procedure Laterality Date  . Ablation      uterine  . Cesarean section        Medication List       This list is accurate as of: 06/06/15 11:59 PM.  Always use your most recent med list.               amantadine 100 MG capsule  Commonly known as:  SYMMETREL  Take 100 mg by mouth daily.     baclofen 20 MG tablet  Commonly known as:  LIORESAL  Take 20 mg by mouth 3 (three) times daily.     carvedilol 12.5 MG tablet  Commonly known as:  COREG  Take 1 tablet (12.5 mg total) by mouth 2 (two) times daily with a meal.     ciprofloxacin 500 MG tablet  Commonly known as:  CIPRO  Take 1 tablet (500 mg total) by mouth 2 (two) times daily.     CRANBERRY PO  Take 100 mg by mouth daily.     DERMA-SMOOTHE/FS SCALP 0.01 % Oil  Apply 1 application topically every Wednesday.     docusate sodium 100 MG capsule  Commonly known as:  COLACE  Take 100 mg by mouth 2 (two) times daily.     DULoxetine 60 MG capsule  Commonly known as:  CYMBALTA  Take 60 mg by mouth daily.     furosemide 80 MG tablet  Commonly known as:  LASIX  Take 1 tablet (80 mg total) by mouth 2 (two) times daily. Takes 160mg  in the morning and  80mg  at bedtime     gabapentin 300 MG capsule  Commonly known as:  NEURONTIN  Take 3 capsules (900 mg total) by mouth 3 (three) times daily.     imipramine 50 MG tablet  Commonly known as:  TOFRANIL  Take 1.5 tablets (75 mg total) by mouth every evening. For depressive disorder     magnesium hydroxide 400 MG/5ML suspension  Commonly known as:  MILK OF MAGNESIA  Take 30 mLs by mouth every 8 (eight) hours as needed for mild constipation or moderate constipation.     natalizumab 300 MG/15ML injection  Commonly known as:  TYSABRI  Inject 300 mg into the vein every 30 (thirty) days.     phenazopyridine 100 MG tablet  Commonly known as:  PYRIDIUM  Take 100 mg by mouth 3 (three) times daily.     polyethylene glycol packet  Commonly known as:  MIRALAX / GLYCOLAX  Take  17 g by mouth 2 (two) times daily.     potassium chloride 20 MEQ packet  Commonly known as:  KLOR-CON  Take 20 mEq by mouth daily.     solifenacin 10 MG tablet  Commonly known as:  VESICARE  Take 10 mg by mouth daily. For bladder spasms     spironolactone 25 MG tablet  Commonly known as:  ALDACTONE  Take 25 mg by mouth daily. For CHF     SYSTANE BALANCE 0.6 % Soln  Generic drug:  Propylene Glycol  Place 1 drop into both eyes 2 (two) times daily.     tamsulosin 0.4 MG Caps capsule  Commonly known as:  FLOMAX  Take 0.4 mg by mouth daily. For incontinence     VITAMIN B-12 PO  Take 1 tablet by mouth daily.     VITAMIN D-3 PO  Take 1 tablet by mouth daily.     XALATAN 0.005 % ophthalmic solution  Generic drug:  latanoprost  Place 1 drop into both eyes at bedtime.        No orders of the defined types were placed in this encounter.    Immunization History  Administered Date(s) Administered  . Influenza,inj,Quad PF,36+ Mos 08/01/2013  . Influenza-Unspecified 08/31/2014  . Pneumococcal Polysaccharide-23 08/01/2013  . Pneumococcal-Unspecified 08/30/2013, 07/29/2014    Social History  Substance Use Topics  . Smoking status: Former Research scientist (life sciences)  . Smokeless tobacco: Never Used  . Alcohol Use: No    Family history is + CA, HTN, MS   Review of Systems  DATA OBTAINED: from patient, nurse GENERAL:  no fevers, fatigue, appetite changes SKIN: No itching, rash or wounds EYES: No eye pain, redness, discharge EARS: No earache, tinnitus, change in hearing NOSE: No congestion, drainage or bleeding  MOUTH/THROAT: No mouth or tooth pain, No sore throat RESPIRATORY: No cough, wheezing, SOB CARDIAC: No chest pain, palpitations, lower extremity edema  GI: No abdominal pain, No N/V/D or constipation, No heartburn or reflux  GU: No dysuria, frequency or urgency, or incontinence  MUSCULOSKELETAL: No unrelieved bone/joint pain NEUROLOGIC: No headache, dizziness or focal  weakness PSYCHIATRIC: No c/o anxiety or sadness   Filed Vitals:   06/18/15 0005  BP: 108/72  Pulse: 86  Temp: 98 F (36.7 C)  Resp: 20    SpO2 Readings from Last 1 Encounters:  06/12/15 100%        Physical Exam  GENERAL APPEARANCE: Alert, conversant,  No acute distress.  SKIN: No diaphoresis rash HEAD: Normocephalic, atraumatic  EYES: Conjunctiva/lids clear. Pupils round, reactive. EOMs intact.  EARS: External exam WNL, canals clear. Hearing grossly normal.  NOSE: No deformity or discharge.  MOUTH/THROAT: Lips w/o lesions  RESPIRATORY: Breathing is even, unlabored. Lung sounds are clear   CARDIOVASCULAR: Heart RRR no murmurs, rubs or gallops. No peripheral edema.   GASTROINTESTINAL: Abdomen is soft, non-tender, not distended w/ normal bowel sounds. GENITOURINARY: Bladder non tender, not distended  MUSCULOSKELETAL: wasting and contractures NEUROLOGIC:  Cranial nerves 2-12 grossly intact; some movement of BUE PSYCHIATRIC: Mood and affect appropriate to situation, no behavioral issues  Patient Active Problem List   Diagnosis Date Noted  . Multiple sclerosis exacerbation ([place]) 06/08/2015  . Weakness 06/08/2015  . Chronic systolic (congestive) heart failure ([place]) 06/08/2015  . Acute encephalopathy 06/03/2015  . Acute kidney injury ([place]) 06/03/2015  . Hypokalemia 06/03/2015  . Sepsis secondary to UTI ([place]) 05/30/2015  . Hypernatremia 05/30/2015  . Dehydration 05/30/2015  . Chronic anemia 05/30/2015  . Chronic pain syndrome 03/03/2015  . Benign hypertensive heart disease without heart failure 01/18/2015  . Urinary tract infection due to Enterococcus   . Essential hypertension   . Abnormal LFTs   . Foot drop, bilateral   . Pressure ulcer 01/12/2015  . UTI (lower urinary tract infection) 01/11/2015  . Hypovolemia 01/11/2015  . UTI (urinary tract infection), uncomplicated   . Cardiomyopathy ([place])   . Urinary incontinence 01/07/2015  . Chronic constipation 11/26/2014   . Depression 11/26/2014  . Tachycardia 02/20/2014  . Venous stasis ulcers ([place]) 02/20/2014  . Leg wound, left 11/04/2013  . FTT (failure to thrive) in adult 07/31/2013  . Medically noncompliant 07/31/2013  . Blisters of multiple sites 07/31/2013  . Generalized weakness 06/03/2013  . Insomnia 06/03/2013  . Neurogenic bladder 06/03/2013  . DS (disseminated sclerosis) ([place]) 02/12/2013  . Chronic diastolic heart failure ([place]) 09/29/2012  . Elevated CK 09/10/2012  . Severe malnutrition ([place]) 09/05/2012  . Paraplegia ([place]) 09/05/2012  . Fall 09/04/2012  . Chronic combined systolic and diastolic congestive heart failure ([place]) 05/22/2012  . Other primary cardiomyopathies 05/01/2012  . Immobility 04/28/2012  . OBESITY 11/30/2008  . ANEMIA, CHRONIC 11/30/2008  . MULTIPLE SCLEROSIS, PROGRESSIVE/RELAPSING 11/30/2008  . HYPERTENSION, BENIGN ESSENTIAL 11/30/2008  . ECZEMA 11/30/2008    CBC    Component Value Date/Time   WBC 7.3 06/09/2015 0540   WBC 6.2 05/18/2015 1626   RBC 3.63* 06/09/2015 0540   RBC 3.55* 05/18/2015 1626   RBC 3.70* 01/12/2015 2111   HGB 9.7* 06/09/2015 0540   HCT 29.9* 06/09/2015 0540   HCT 29.6* 05/18/2015 1626   PLT 293 06/09/2015 0540   MCV 82.4 06/09/2015 0540   LYMPHSABS 2.5 06/08/2015 1326   LYMPHSABS 2.1 05/18/2015 1626   MONOABS 0.6 06/08/2015 1326   EOSABS 0.5 06/08/2015 1326   BASOSABS 0.1 06/08/2015 1326   BASOSABS 0.1 05/18/2015 1626    CMP     Component Value Date/Time   NA 140 06/09/2015 0540   NA 142 09/28/2014   K 3.1* 06/09/2015 0540   CL 105 06/09/2015 0540   CO2 28 06/09/2015 0540   GLUCOSE 100* 06/09/2015 0540   BUN 10 06/09/2015 0540   BUN 16 09/28/2014   CREATININE 0.93 06/09/2015 0540   CALCIUM 9.3 06/09/2015 0540   PROT 6.9 06/08/2015 1326   ALBUMIN 3.9 06/08/2015 1326   AST 33 06/08/2015 1326   ALT 99* 06/08/2015 1326   ALKPHOS 191* 06/08/2015 1326   BILITOT 0.4 06/08/2015 1326   GFRNONAA >60 06/09/2015 0540   GFRAA  >60 06/09/2015 0540  Lab Results  Component Value Date   HGBA1C 5.3 01/11/2015     Dg Chest Port 1 View  05/30/2015  CLINICAL DATA:  Lethargy and altered mental status, 2 days duration. Febrile. EXAM: PORTABLE CHEST 1 VIEW COMPARISON:  01/10/2015 FINDINGS: A single AP portable view of the chest demonstrates no focal airspace consolidation or alveolar edema. The lungs are grossly clear. There is no large effusion or pneumothorax. Cardiac and mediastinal contours appear unremarkable. IMPRESSION: No active disease. Electronically Signed   By: Andreas Newport M.D.   On: 05/30/2015 21:35    Not all labs, radiology exams or other studies done during hospitalization come through on my EPIC note; however they are reviewed by me.    Assessment and Plan  Sepsis secondary to UTI [facility]) Patient admitted to stepdown and given aggressive IV hydration. Placed on empiric Rocephin and vancomycin (given history of enterococcus). Urine culture with mixed bacteria. . Leukocytosis and fever improved. Mental status much improved and answering appropriately. Antibiotic narrowed to IV Rocephin. SNF - oral ciprofloxacin to complete a total 10 day course of antibiotic. Stop date 06/09/2015.    Acute kidney injury ([place]) Possibly severe to sepsis from UTI and dehydration. Improving with aggressive hydration. Held lasix and aldactone. SNF - lasix and aldactone restarted; monitor with BMP  Acute encephalopathy Secondary to sepsis. on multiple meds including baclofen, Neurontin, antidepressants and Ambien which are held. Mental status much improved and patient also to communicate is will oriented and answering questions appropriately.   MULTIPLE SCLEROSIS, PROGRESSIVE/RELAPSING SNF - Resume baclofen,Continue amantadine, Continue monthly injections as outpatient.   Depression SNF - Resume cymbalta and imipramine.  Chronic anemia SNF - cont Vit B12; follow CBC  Neurogenic bladder SNF - cont flomax and  vesicare   Time spent > 45 min;> 50% of time with patient was spent reviewing records, labs, tests and studies, counseling and developing plan of care  Hennie Duos, MD

## 2015-06-08 ENCOUNTER — Encounter (HOSPITAL_COMMUNITY): Payer: Self-pay | Admitting: Internal Medicine

## 2015-06-08 ENCOUNTER — Emergency Department (HOSPITAL_COMMUNITY): Payer: Medicare Other

## 2015-06-08 ENCOUNTER — Non-Acute Institutional Stay (SKILLED_NURSING_FACILITY): Payer: Medicare Other | Admitting: Adult Health

## 2015-06-08 ENCOUNTER — Encounter: Payer: Self-pay | Admitting: Adult Health

## 2015-06-08 ENCOUNTER — Inpatient Hospital Stay (HOSPITAL_COMMUNITY)
Admission: EM | Admit: 2015-06-08 | Discharge: 2015-06-12 | DRG: 059 | Disposition: A | Discharge status: Skilled Nursing Facility | Payer: Medicare Other | Attending: Family Medicine | Admitting: Family Medicine

## 2015-06-08 DIAGNOSIS — F329 Major depressive disorder, single episode, unspecified: Secondary | ICD-10-CM | POA: Diagnosis present

## 2015-06-08 DIAGNOSIS — Z803 Family history of malignant neoplasm of breast: Secondary | ICD-10-CM

## 2015-06-08 DIAGNOSIS — K5909 Other constipation: Secondary | ICD-10-CM | POA: Diagnosis present

## 2015-06-08 DIAGNOSIS — Z820 Family history of epilepsy and other diseases of the nervous system: Secondary | ICD-10-CM | POA: Diagnosis not present

## 2015-06-08 DIAGNOSIS — I429 Cardiomyopathy, unspecified: Secondary | ICD-10-CM | POA: Diagnosis present

## 2015-06-08 DIAGNOSIS — R531 Weakness: Secondary | ICD-10-CM | POA: Diagnosis not present

## 2015-06-08 DIAGNOSIS — Z79899 Other long term (current) drug therapy: Secondary | ICD-10-CM

## 2015-06-08 DIAGNOSIS — Z880 Allergy status to penicillin: Secondary | ICD-10-CM | POA: Diagnosis not present

## 2015-06-08 DIAGNOSIS — Z8249 Family history of ischemic heart disease and other diseases of the circulatory system: Secondary | ICD-10-CM | POA: Diagnosis not present

## 2015-06-08 DIAGNOSIS — R32 Unspecified urinary incontinence: Secondary | ICD-10-CM | POA: Diagnosis present

## 2015-06-08 DIAGNOSIS — Z882 Allergy status to sulfonamides status: Secondary | ICD-10-CM

## 2015-06-08 DIAGNOSIS — N390 Urinary tract infection, site not specified: Secondary | ICD-10-CM | POA: Diagnosis present

## 2015-06-08 DIAGNOSIS — G35 Multiple sclerosis: Principal | ICD-10-CM | POA: Diagnosis present

## 2015-06-08 DIAGNOSIS — Z87891 Personal history of nicotine dependence: Secondary | ICD-10-CM | POA: Diagnosis not present

## 2015-06-08 DIAGNOSIS — R4182 Altered mental status, unspecified: Secondary | ICD-10-CM | POA: Diagnosis not present

## 2015-06-08 DIAGNOSIS — H409 Unspecified glaucoma: Secondary | ICD-10-CM | POA: Diagnosis present

## 2015-06-08 DIAGNOSIS — N3289 Other specified disorders of bladder: Secondary | ICD-10-CM | POA: Diagnosis present

## 2015-06-08 DIAGNOSIS — I5022 Chronic systolic (congestive) heart failure: Secondary | ICD-10-CM | POA: Diagnosis present

## 2015-06-08 DIAGNOSIS — G8929 Other chronic pain: Secondary | ICD-10-CM | POA: Diagnosis present

## 2015-06-08 DIAGNOSIS — I10 Essential (primary) hypertension: Secondary | ICD-10-CM | POA: Diagnosis present

## 2015-06-08 DIAGNOSIS — I639 Cerebral infarction, unspecified: Secondary | ICD-10-CM

## 2015-06-08 DIAGNOSIS — I110 Hypertensive heart disease with heart failure: Secondary | ICD-10-CM | POA: Diagnosis present

## 2015-06-08 LAB — CBC WITH DIFFERENTIAL/PLATELET
BASOS PCT: 1 %
Basophils Absolute: 0.1 10*3/uL (ref 0.0–0.1)
EOS ABS: 0.5 10*3/uL (ref 0.0–0.7)
EOS PCT: 8 %
HCT: 30.9 % — ABNORMAL LOW (ref 36.0–46.0)
Hemoglobin: 10 g/dL — ABNORMAL LOW (ref 12.0–15.0)
LYMPHS ABS: 2.5 10*3/uL (ref 0.7–4.0)
Lymphocytes Relative: 40 %
MCH: 27 pg (ref 26.0–34.0)
MCHC: 32.4 g/dL (ref 30.0–36.0)
MCV: 83.5 fL (ref 78.0–100.0)
MONOS PCT: 9 %
Monocytes Absolute: 0.6 10*3/uL (ref 0.1–1.0)
NEUTROS PCT: 42 %
Neutro Abs: 2.7 10*3/uL (ref 1.7–7.7)
PLATELETS: 282 10*3/uL (ref 150–400)
RBC: 3.7 MIL/uL — ABNORMAL LOW (ref 3.87–5.11)
RDW: 16.2 % — ABNORMAL HIGH (ref 11.5–15.5)
WBC: 6.4 10*3/uL (ref 4.0–10.5)

## 2015-06-08 LAB — URINALYSIS, ROUTINE W REFLEX MICROSCOPIC
BILIRUBIN URINE: NEGATIVE
Glucose, UA: NEGATIVE mg/dL
HGB URINE DIPSTICK: NEGATIVE
KETONES UR: NEGATIVE mg/dL
NITRITE: POSITIVE — AB
PROTEIN: NEGATIVE mg/dL
Specific Gravity, Urine: 1.015 (ref 1.005–1.030)
pH: 5.5 (ref 5.0–8.0)

## 2015-06-08 LAB — COMPREHENSIVE METABOLIC PANEL
ALK PHOS: 191 U/L — AB (ref 38–126)
ALT: 99 U/L — AB (ref 14–54)
AST: 33 U/L (ref 15–41)
Albumin: 3.9 g/dL (ref 3.5–5.0)
Anion gap: 9 (ref 5–15)
BUN: 11 mg/dL (ref 6–20)
CALCIUM: 9.3 mg/dL (ref 8.9–10.3)
CHLORIDE: 105 mmol/L (ref 101–111)
CO2: 31 mmol/L (ref 22–32)
CREATININE: 1.02 mg/dL — AB (ref 0.44–1.00)
Glucose, Bld: 98 mg/dL (ref 65–99)
Potassium: 3.4 mmol/L — ABNORMAL LOW (ref 3.5–5.1)
Sodium: 145 mmol/L (ref 135–145)
Total Bilirubin: 0.4 mg/dL (ref 0.3–1.2)
Total Protein: 6.9 g/dL (ref 6.5–8.1)

## 2015-06-08 LAB — I-STAT CG4 LACTIC ACID, ED
LACTIC ACID, VENOUS: 0.72 mmol/L (ref 0.5–2.0)
Lactic Acid, Venous: 0.56 mmol/L (ref 0.5–2.0)

## 2015-06-08 LAB — URINE MICROSCOPIC-ADD ON

## 2015-06-08 LAB — CBG MONITORING, ED: Glucose-Capillary: 82 mg/dL (ref 65–99)

## 2015-06-08 MED ORDER — ENOXAPARIN SODIUM 40 MG/0.4ML ~~LOC~~ SOLN
40.0000 mg | Freq: Every day | SUBCUTANEOUS | Status: DC
  Administered 2015-06-08 – 2015-06-11 (×4): 40 mg via SUBCUTANEOUS
  Filled 2015-06-08 (×4): qty 0.4

## 2015-06-08 MED ORDER — BACLOFEN 20 MG PO TABS
20.0000 mg | ORAL_TABLET | Freq: Three times a day (TID) | ORAL | Status: DC
  Administered 2015-06-09 – 2015-06-12 (×8): 20 mg via ORAL
  Filled 2015-06-08 (×13): qty 1

## 2015-06-08 MED ORDER — ALUM & MAG HYDROXIDE-SIMETH 200-200-20 MG/5ML PO SUSP: 30.0000 mL | Freq: Four times a day (QID) | ORAL | Status: DC | PRN

## 2015-06-08 MED ORDER — DOCUSATE SODIUM 100 MG PO CAPS
100.0000 mg | ORAL_CAPSULE | Freq: Two times a day (BID) | ORAL | Status: DC
  Administered 2015-06-09 – 2015-06-12 (×5): 100 mg via ORAL
  Filled 2015-06-08 (×7): qty 1

## 2015-06-08 MED ORDER — SPIRONOLACTONE 25 MG PO TABS
25.0000 mg | ORAL_TABLET | Freq: Every day | ORAL | Status: DC
  Administered 2015-06-09 – 2015-06-12 (×3): 25 mg via ORAL
  Filled 2015-06-08 (×4): qty 1

## 2015-06-08 MED ORDER — NATALIZUMAB 300 MG/15ML IV CONC: 300.0000 mg | INTRAVENOUS | Status: DC

## 2015-06-08 MED ORDER — LATANOPROST 0.005 % OP SOLN
1.0000 [drp] | Freq: Every day | OPHTHALMIC | Status: DC
  Administered 2015-06-08 – 2015-06-11 (×4): 1 [drp] via OPHTHALMIC
  Filled 2015-06-08: qty 2.5

## 2015-06-08 MED ORDER — SODIUM CHLORIDE 0.9 % IV SOLN
INTRAVENOUS | Status: DC
  Administered 2015-06-08 – 2015-06-12 (×4): via INTRAVENOUS

## 2015-06-08 MED ORDER — SODIUM CHLORIDE 0.9 % IJ SOLN
3.0000 mL | Freq: Two times a day (BID) | INTRAMUSCULAR | Status: DC
  Administered 2015-06-08 – 2015-06-11 (×2): 3 mL via INTRAVENOUS

## 2015-06-08 MED ORDER — ONDANSETRON HCL 4 MG/2ML IJ SOLN: 4.0000 mg | Freq: Four times a day (QID) | INTRAMUSCULAR | Status: DC | PRN

## 2015-06-08 MED ORDER — SODIUM CHLORIDE 0.9 % IV BOLUS (SEPSIS)
1000.0000 mL | Freq: Once | INTRAVENOUS | Status: AC
  Administered 2015-06-08: 1000 mL via INTRAVENOUS

## 2015-06-08 MED ORDER — VITAMIN D 1000 UNITS PO TABS
1000.0000 [IU] | ORAL_TABLET | Freq: Every day | ORAL | Status: DC
  Administered 2015-06-09 – 2015-06-12 (×3): 1000 [IU] via ORAL
  Filled 2015-06-08 (×4): qty 1

## 2015-06-08 MED ORDER — ACETAMINOPHEN 650 MG RE SUPP: 650.0000 mg | Freq: Four times a day (QID) | RECTAL | Status: DC | PRN

## 2015-06-08 MED ORDER — DEXTROSE 5 % IV SOLN
1.0000 g | Freq: Once | INTRAVENOUS | Status: AC
  Administered 2015-06-08: 1 g via INTRAVENOUS
  Filled 2015-06-08: qty 10

## 2015-06-08 MED ORDER — TAMSULOSIN HCL 0.4 MG PO CAPS
0.4000 mg | ORAL_CAPSULE | Freq: Every day | ORAL | Status: DC
  Administered 2015-06-09 – 2015-06-12 (×3): 0.4 mg via ORAL
  Filled 2015-06-08 (×4): qty 1

## 2015-06-08 MED ORDER — DULOXETINE HCL 60 MG PO CPEP
60.0000 mg | ORAL_CAPSULE | Freq: Every day | ORAL | Status: DC
  Administered 2015-06-09 – 2015-06-12 (×3): 60 mg via ORAL
  Filled 2015-06-08 (×4): qty 1

## 2015-06-08 MED ORDER — FUROSEMIDE 40 MG PO TABS
80.0000 mg | ORAL_TABLET | Freq: Two times a day (BID) | ORAL | Status: DC
  Administered 2015-06-09 – 2015-06-12 (×5): 80 mg via ORAL
  Filled 2015-06-08 (×7): qty 2

## 2015-06-08 MED ORDER — HYDROMORPHONE HCL 1 MG/ML IJ SOLN
0.5000 mg | INTRAMUSCULAR | Status: DC | PRN
  Administered 2015-06-09: 1 mg via INTRAVENOUS
  Filled 2015-06-08: qty 1

## 2015-06-08 MED ORDER — IMIPRAMINE HCL 50 MG PO TABS
75.0000 mg | ORAL_TABLET | Freq: Every evening | ORAL | Status: DC
  Administered 2015-06-10 – 2015-06-11 (×2): 75 mg via ORAL
  Filled 2015-06-08 (×5): qty 1

## 2015-06-08 MED ORDER — AMANTADINE HCL 100 MG PO CAPS
100.0000 mg | ORAL_CAPSULE | Freq: Every day | ORAL | Status: DC
  Administered 2015-06-09 – 2015-06-12 (×3): 100 mg via ORAL
  Filled 2015-06-08 (×4): qty 1

## 2015-06-08 MED ORDER — POLYETHYLENE GLYCOL 3350 17 G PO PACK
17.0000 g | PACK | Freq: Two times a day (BID) | ORAL | Status: DC
  Administered 2015-06-09 – 2015-06-12 (×6): 17 g via ORAL
  Filled 2015-06-08 (×7): qty 1

## 2015-06-08 MED ORDER — OXYCODONE HCL 5 MG PO TABS: 5.0000 mg | ORAL_TABLET | ORAL | Status: DC | PRN

## 2015-06-08 MED ORDER — ONDANSETRON HCL 4 MG PO TABS: 4.0000 mg | ORAL_TABLET | Freq: Four times a day (QID) | ORAL | Status: DC | PRN

## 2015-06-08 MED ORDER — CARVEDILOL 12.5 MG PO TABS
12.5000 mg | ORAL_TABLET | Freq: Two times a day (BID) | ORAL | Status: DC
  Administered 2015-06-09 – 2015-06-12 (×5): 12.5 mg via ORAL
  Filled 2015-06-08 (×6): qty 1

## 2015-06-08 MED ORDER — POLYVINYL ALCOHOL 1.4 % OP SOLN
1.0000 [drp] | Freq: Two times a day (BID) | OPHTHALMIC | Status: DC
  Administered 2015-06-08 – 2015-06-12 (×7): 1 [drp] via OPHTHALMIC
  Filled 2015-06-08: qty 15

## 2015-06-08 MED ORDER — POTASSIUM CHLORIDE CRYS ER 20 MEQ PO TBCR
20.0000 meq | EXTENDED_RELEASE_TABLET | Freq: Every day | ORAL | Status: DC
  Administered 2015-06-09 – 2015-06-12 (×3): 20 meq via ORAL
  Filled 2015-06-08 (×6): qty 1

## 2015-06-08 MED ORDER — DEXTROSE 5 % IV SOLN
1.0000 g | INTRAVENOUS | Status: DC
  Administered 2015-06-09 – 2015-06-11 (×3): 1 g via INTRAVENOUS
  Filled 2015-06-08 (×4): qty 10

## 2015-06-08 MED ORDER — ACETAMINOPHEN 325 MG PO TABS: 650.0000 mg | ORAL_TABLET | Freq: Four times a day (QID) | ORAL | Status: DC | PRN

## 2015-06-08 MED ORDER — GABAPENTIN 300 MG PO CAPS
900.0000 mg | ORAL_CAPSULE | Freq: Three times a day (TID) | ORAL | Status: DC
Start: 2015-06-08 — End: 2015-06-12
  Administered 2015-06-09: 600 mg via ORAL
  Administered 2015-06-09 – 2015-06-12 (×7): 900 mg via ORAL
  Filled 2015-06-08 (×12): qty 3

## 2015-06-08 MED ORDER — DARIFENACIN HYDROBROMIDE ER 15 MG PO TB24
15.0000 mg | ORAL_TABLET | Freq: Every day | ORAL | Status: DC
  Administered 2015-06-09 – 2015-06-12 (×3): 15 mg via ORAL
  Filled 2015-06-08 (×4): qty 1

## 2015-06-08 NOTE — ED Notes (Signed)
Patient transported to MRI 

## 2015-06-08 NOTE — H&P (Signed)
[facility] Admission History and Physical       Shannon Garrett [id number] DOB: 01/02/1961 DOA: 06/08/2015  Referring physician: EDP PCP: Hennie Duos, MD  Specialists:   Chief Complaint: Increased Weakness  HPI: Shannon Garrett is a 54 y.o. female with a history of Multiple Sclerosis , Systolic CHf, HTN, Glaucoma who was sent for the [facility] to the ED due to increasing Lethargy over the past 2 days.   In the ED she was evaluated and sent for a CT scan and an MRI which were negative for acute findings, and a urinalysis was positive, however she has been on Pyridium and Cipro Rx so a urine culture was sent and she was placed on empiric IV Rocephin and referred for admission.   Neurology was consulted.      Review of Systems:  Constitutional: No Weight Loss, No Weight Gain, Night Sweats, Fevers, Chills, Dizziness, Light Headedness, Fatigue, +Generalized Weakness HEENT: No Headaches, Difficulty Swallowing,Tooth/Dental Problems,Sore Throat,  No Sneezing, Rhinitis, Ear Ache, Nasal Congestion, or Post Nasal Drip,  Cardio-vascular:  No Chest pain, Orthopnea, PND, Edema in Lower Extremities, Anasarca, Dizziness, Palpitations  Resp: No Dyspnea, No DOE, No Productive Cough, No Non-Productive Cough, No Hemoptysis, No Wheezing.    GI: No Heartburn, Indigestion, Abdominal Pain, Nausea, Vomiting, Diarrhea, Constipation, Hematemesis, Hematochezia, Melena, Change in Bowel Habits,  Loss of Appetite  GU: No Dysuria, No Change in Color of Urine, No Urgency or Urinary Frequency, No Flank pain.  Musculoskeletal: No Joint Pain or Swelling, No Decreased Range of Motion, No Back Pain.  Neurologic: No Syncope, No Seizures, Muscle Weakness, Paresthesia, Vision Disturbance or Loss, No Diplopia, No Vertigo, No Difficulty Walking,  Skin: No Rash or Lesions. Psych: No Change in Mood or Affect, No Depression or Anxiety, No Memory loss, No Confusion, or Hallucinations   Past Medical History    Diagnosis Date  . MS (multiple sclerosis) ([place])     a. Dx'd late 20's. b. Tx with Novantrone, Tysabri, Copaxone previously.  Marland Kitchen Hypertension   . Depression   . Glaucoma   . Cardiomyopathy ([place])     a.  Echo 04/29/12: Mild LVH, EF 20-25%, mild AI, moderate MR, moderate LAE, mild RAE, mild RVE, moderate TR, PASP 51, small pericardial effusion;   b. probably non-ischemic given multiple chemo-Tx agents used for MS and global LV dysfn on echo  . Chronic systolic heart failure [facility])      Past Surgical History  Procedure Laterality Date  . Ablation      uterine  . Cesarean section        Prior to Admission medications   Medication Sig Start Date End Date Taking? Authorizing Provider  amantadine (SYMMETREL) 100 MG capsule Take 100 mg by mouth daily.    Yes Historical Provider, MD  baclofen (LIORESAL) 20 MG tablet Take 20 mg by mouth 3 (three) times daily.   Yes Historical Provider, MD  carvedilol (COREG) 12.5 MG tablet Take 1 tablet (12.5 mg total) by mouth 2 (two) times daily with a meal. 01/16/15  Yes Bonnielee Haff, MD  Cholecalciferol (VITAMIN D-3 PO) Take 1 tablet by mouth daily.   Yes Historical Provider, MD  ciprofloxacin (CIPRO) 500 MG tablet Take 1 tablet (500 mg total) by mouth 2 (two) times daily. 06/03/15 06/09/15 Yes Nishant Dhungel, MD  CRANBERRY PO Take 100 mg by mouth daily.   Yes Historical Provider, MD  Cyanocobalamin (VITAMIN B-12 PO) Take 1 tablet by mouth daily.   Yes Historical Provider,  MD  docusate sodium (COLACE) 100 MG capsule Take 100 mg by mouth 2 (two) times daily.   Yes Historical Provider, MD  DULoxetine (CYMBALTA) 60 MG capsule Take 60 mg by mouth daily.   Yes Historical Provider, MD  Fluocinolone Acetonide (DERMA-SMOOTHE/FS SCALP) 0.01 % OIL Apply 1 application topically every Wednesday.   Yes Historical Provider, MD  furosemide (LASIX) 80 MG tablet Take 1 tablet (80 mg total) by mouth 2 (two) times daily. Takes 160mg  in the morning and 80mg  at bedtime Patient  taking differently: Take 80-160 mg by mouth 2 (two) times daily. Takes 160mg  in the morning and 80mg  at bedtime 06/03/15  Yes Nishant Dhungel, MD  gabapentin (NEURONTIN) 300 MG capsule Take 3 capsules (900 mg total) by mouth 3 (three) times daily. 09/27/14  Yes Adam Telford Nab, DO  imipramine (TOFRANIL) 50 MG tablet Take 1.5 tablets (75 mg total) by mouth every evening. For depressive disorder 01/05/15  Yes Gerlene Fee, NP  latanoprost (XALATAN) 0.005 % ophthalmic solution Place 1 drop into both eyes at bedtime.   Yes Historical Provider, MD  magnesium hydroxide (MILK OF MAGNESIA) 400 MG/5ML suspension Take 30 mLs by mouth every 8 (eight) hours as needed for mild constipation or moderate constipation.   Yes Historical Provider, MD  natalizumab (TYSABRI) 300 MG/15ML injection Inject 300 mg into the vein every 30 (thirty) days.   Yes Historical Provider, MD  oxyCODONE-acetaminophen (PERCOCET) 7.5-325 MG tablet Take 1 tablet by mouth every 6 (six) hours as needed for moderate pain or severe pain.   Yes Historical Provider, MD  phenazopyridine (PYRIDIUM) 100 MG tablet Take 100 mg by mouth 3 (three) times daily.    Yes Historical Provider, MD  polyethylene glycol (MIRALAX / GLYCOLAX) packet Take 17 g by mouth 2 (two) times daily. 01/05/15  Yes Gerlene Fee, NP  potassium chloride (KLOR-CON) 20 MEQ packet Take 20 mEq by mouth daily. 01/16/15  Yes Bonnielee Haff, MD  Propylene Glycol (SYSTANE BALANCE) 0.6 % SOLN Place 1 drop into both eyes 2 (two) times daily.   Yes Historical Provider, MD  solifenacin (VESICARE) 10 MG tablet Take 10 mg by mouth daily. For bladder spasms   Yes Historical Provider, MD  spironolactone (ALDACTONE) 25 MG tablet Take 25 mg by mouth daily. For CHF   Yes Historical Provider, MD  tamsulosin (FLOMAX) 0.4 MG CAPS capsule Take 0.4 mg by mouth daily. For incontinence   Yes Historical Provider, MD  zolpidem (AMBIEN) 10 MG tablet Take 10 mg by mouth at bedtime.   Yes Historical Provider, MD       Allergies  Allergen Reactions  . Sulfonamide Derivatives Hives and Shortness Of Breath  . Penicillins Hives and Itching    Has patient had a PCN reaction causing immediate rash, facial/tongue/throat swelling, SOB or lightheadedness with hypotension: unknown Has patient had a PCN reaction causing severe rash involving mucus membranes or skin necrosis: unknown Has patient had a PCN reaction that required hospitalization: unknown Has patient had a PCN reaction occurring within the last 10 years: unknown If all of the above answers are "NO", then may proceed with Cephalosporin use.     Social History:  reports that she has quit smoking. She has never used smokeless tobacco. She reports that she does not drink alcohol or use illicit drugs.    Family History  Problem Relation Age of Onset  . Hypertension Mother   . Heart attack Neg Hx   . Cancer Mother  breast   . Cancer Father     prostate  . Multiple sclerosis Sister        Physical Exam:  GEN:  Pleasant  Obese 54 y.o. African American female examined and in no acute distress; cooperative with exam Filed Vitals:   06/08/15 1615 06/08/15 1624 06/08/15 1811 06/08/15 1935  BP:  139/65 158/66 150/62  Pulse: 79 77 78 82  Temp:  98.9 F (37.2 C)    TempSrc:  Oral    Resp: 23 12 17 16   Weight:      SpO2: 99% 98% 100% 98%   Blood pressure 150/62, pulse 82, temperature 98.9 F (37.2 C), temperature source Oral, resp. rate 16, weight 103.42 kg (228 lb), SpO2 98 %. PSYCH: She is alert and oriented x4; does not appear anxious does not appear depressed; affect is normal HEENT: Normocephalic and Atraumatic, Mucous membranes pink; PERRLA; EOM intact; Fundi:  Benign;  No scleral icterus, Nares: Patent, Oropharynx: Clear,  Fair Dentition,    Neck:  FROM, No Cervical Lymphadenopathy nor Thyromegaly or Carotid Bruit; No JVD; Breasts:: Not examined CHEST WALL: No tenderness CHEST: Normal respiration, clear to auscultation  bilaterally HEART: Regular rate and rhythm; no murmurs rubs or gallops BACK: No kyphosis or scoliosis; No CVA tenderness ABDOMEN: Positive Bowel Sounds, Obese, Soft Non-Tender, No Rebound or Guarding; No Masses, No Organomegaly. Rectal Exam: Not done EXTREMITIES: No Cyanosis, Clubbing, or Edema; No Ulcerations.  BLEs in [employer] Genitalia: not examined PULSES: 2+ and symmetric SKIN: Normal hydration no rash or ulceration CNS:  Alert and Oriented x 4, Generalized Weakness Vascular: pulses palpable throughout    Labs on Admission:  Basic Metabolic Panel:  Recent Labs Lab 06/02/15 0347 06/03/15 0515 06/08/15 1326  NA 150* 141 145  K 3.5 3.1* 3.4*  CL 114* 108 105  CO2 25 24 31   GLUCOSE 119* 108* 98  BUN 18 13 11   CREATININE 0.99 0.81 1.02*  CALCIUM 9.1 8.8* 9.3   Liver Function Tests:  Recent Labs Lab 06/02/15 0347 06/08/15 1326  AST 21 33  ALT 20 99*  ALKPHOS 123 191*  BILITOT 0.8 0.4  PROT 6.5 6.9  ALBUMIN 3.3* 3.9   No results for input(s): LIPASE, AMYLASE in the last 168 hours. No results for input(s): AMMONIA in the last 168 hours. CBC:  Recent Labs Lab 06/02/15 0347 06/03/15 0515 06/08/15 1326  WBC 12.2* 10.1 6.4  NEUTROABS  --   --  2.7  HGB 10.4* 9.9* 10.0*  HCT 32.0* 29.5* 30.9*  MCV 84.7 83.3 83.5  PLT 257 228 282   Cardiac Enzymes: No results for input(s): CKTOTAL, CKMB, CKMBINDEX, TROPONINI in the last 168 hours.  BNP (last 3 results) No results for input(s): BNP in the last 8760 hours.  ProBNP (last 3 results) No results for input(s): PROBNP in the last 8760 hours.  CBG:  Recent Labs Lab 06/08/15 1304  GLUCAP 82    Radiological Exams on Admission: Ct Head Wo Contrast  06/08/2015  CLINICAL DATA:  Sepsis.  Lethargy and confusion. EXAM: CT HEAD WITHOUT CONTRAST TECHNIQUE: Contiguous axial images were obtained from the base of the skull through the vertex without intravenous contrast. COMPARISON:  01/10/2015 FINDINGS: Mild global  atrophy. There are prominent chronic ischemic changes in the periventricular white matter. These findings are stable. No mass effect, midline shift, or acute hemorrhage. Mastoid air cells are clear. Cranium is intact. IMPRESSION: No acute intracranial pathology. Electronically Signed   By: Rodena Goldmann.D.  On: 06/08/2015 16:16   Mr Brain Wo Contrast  06/08/2015  CLINICAL DATA:  Altered mental status. Right-sided weakness. Lethargy and increased confusion. EXAM: MRI HEAD WITHOUT CONTRAST TECHNIQUE: Multiplanar, multiecho pulse sequences of the brain and surrounding structures were obtained without intravenous contrast. COMPARISON:  CT head without contrast from the same day. MRI brain 10/08/2014 FINDINGS: Extensive periventricular and subcortical T2 changes are stable bilaterally. There is extensive involvement of the colossoseptal margin. No new lesions are present. There is no restricted diffusion. The ventricles are proportionate to the degree of atrophy. No significant extra-axial fluid collection is present. The brainstem and cerebellum are within normal limits. The internal auditory canals are normal. Flow is present in the major intracranial arteries. The globes and orbits are intact. The paranasal sinuses and mastoid air cells are clear. IMPRESSION: 1. Stable diffuse white matter changes compatible with the given diagnosis of multiple sclerosis. 2. No acute intracranial abnormalities. Electronically Signed   By: San Morelle M.D.   On: 06/08/2015 19:10   Dg Chest Port 1 View  06/08/2015  CLINICAL DATA:  Sepsis, weakness EXAM: PORTABLE CHEST 1 VIEW COMPARISON:  05/30/2015 FINDINGS: Cardiomediastinal silhouette is stable. No acute infiltrate or pleural effusion. No pulmonary edema. Study is limited by poor inspiration. IMPRESSION: No active disease. Electronically Signed   By: Lahoma Crocker M.D.   On: 06/08/2015 15:42      Assessment/Plan:      54 y.o. female with  Principal Problem:   1.      Multiple sclerosis exacerbation [facility])   Neurology Consultation   Continue    Active Problems:   2.     UTI (lower urinary tract infection)   Urine C+S sent   IV Rocephin     3.     Weakness- due to #1, and #2        4.     HYPERTENSION, BENIGN ESSENTIAL   Continue Lasix, Carvedilol and    PRN IV Hydralazine   Monitor BPs       5.     Chronic systolic (congestive) heart failure (HCC)   Continue Lasix BID, and Carvedilol Rx as BP tolerates     6.     DVT Prophylaxis   Lovenox    Code Status:     FULL CODE  Family Communication:   No Family Present    Disposition Plan:    Inpatient Status        Time spent: [facility] Pager [phone]   If [place] Please Contact the Day Rounding Team MD for [facility]  If 7PM-7AM, Please Contact Night-Floor Coverage  [email or web address] Password [email or web address] 06/08/2015, 9:19 PM     ADDENDUM:   Patient was seen and examined on 06/08/2015

## 2015-06-08 NOTE — ED Provider Notes (Signed)
CSN: [id number]     Arrival date & time 06/08/15  1257 History   First MD Initiated Contact with Patient 06/08/15 1504     Chief Complaint  Patient presents with  . Lethargic      (Consider location/radiation/quality/duration/timing/severity/associated sxs/prior Treatment) Patient is a 54 y.o. female presenting with general illness. The history is provided by the patient.  Illness Severity:  Moderate Onset quality:  Gradual Duration:  2 days Timing:  Constant Progression:  Worsening Chronicity:  Recurrent Associated symptoms: fatigue   Associated symptoms: no chest pain, no congestion, no fever, no headaches, no myalgias, no nausea, no rhinorrhea, no shortness of breath, no vomiting and no wheezing    54 yo F with a significant past medical history of MS comes in with a chief complaint of lethargy. Per the nursing home has been getting worse over the past couple days. Patient with multiple metastases with similar presentations usually related to a urinary tract infection. Was seen by her nurse practitioner at her nursing home today is practitioner concern for possible stroke with one-sided weakness. Looking back at prior medical documentation multiple differing neuro exams. Difficult to determine if this is new weakness or not. Patient states is old that is mildly confused on exam. Denies fever chills. Patient has been on Pyridium for the past 6 days.  Past Medical History  Diagnosis Date  . MS (multiple sclerosis) ([place])     a. Dx'd late 20's. b. Tx with Novantrone, Tysabri, Copaxone previously.  Marland Kitchen Hypertension   . Depression   . Glaucoma   . Cardiomyopathy ([place])     a.  Echo 04/29/12: Mild LVH, EF 20-25%, mild AI, moderate MR, moderate LAE, mild RAE, mild RVE, moderate TR, PASP 51, small pericardial effusion;   b. probably non-ischemic given multiple chemo-Tx agents used for MS and global LV dysfn on echo  . Chronic systolic heart failure [facility])    Past Surgical History  Procedure  Laterality Date  . Ablation      uterine  . Cesarean section     Family History  Problem Relation Age of Onset  . Hypertension Mother   . Heart attack Neg Hx   . Cancer Mother     breast   . Cancer Father     prostate  . Multiple sclerosis Sister    Social History  Substance Use Topics  . Smoking status: Former Research scientist (life sciences)  . Smokeless tobacco: Never Used  . Alcohol Use: No   OB History    No data available     Review of Systems  Constitutional: Positive for fatigue. Negative for fever and chills.  HENT: Negative for congestion and rhinorrhea.   Eyes: Negative for redness and visual disturbance.  Respiratory: Negative for shortness of breath and wheezing.   Cardiovascular: Negative for chest pain and palpitations.  Gastrointestinal: Negative for nausea and vomiting.  Genitourinary: Negative for dysuria and urgency.  Musculoskeletal: Negative for myalgias and arthralgias.  Skin: Negative for pallor and wound.  Neurological: Positive for weakness. Negative for dizziness and headaches.      Allergies  Sulfonamide derivatives and Penicillins  Home Medications   Prior to Admission medications   Medication Sig Start Date End Date Taking? Authorizing Provider  amantadine (SYMMETREL) 100 MG capsule Take 100 mg by mouth daily.    Yes Historical Provider, MD  baclofen (LIORESAL) 20 MG tablet Take 20 mg by mouth 3 (three) times daily.   Yes Historical Provider, MD  carvedilol (COREG) 12.5 MG tablet  Take 1 tablet (12.5 mg total) by mouth 2 (two) times daily with a meal. 01/16/15  Yes Bonnielee Haff, MD  Cholecalciferol (VITAMIN D-3 PO) Take 1 tablet by mouth daily.   Yes Historical Provider, MD  ciprofloxacin (CIPRO) 500 MG tablet Take 1 tablet (500 mg total) by mouth 2 (two) times daily. 06/03/15 06/09/15 Yes Nishant Dhungel, MD  CRANBERRY PO Take 100 mg by mouth daily.   Yes Historical Provider, MD  Cyanocobalamin (VITAMIN B-12 PO) Take 1 tablet by mouth daily.   Yes Historical  Provider, MD  docusate sodium (COLACE) 100 MG capsule Take 100 mg by mouth 2 (two) times daily.   Yes Historical Provider, MD  DULoxetine (CYMBALTA) 60 MG capsule Take 60 mg by mouth daily.   Yes Historical Provider, MD  Fluocinolone Acetonide (DERMA-SMOOTHE/FS SCALP) 0.01 % OIL Apply 1 application topically every Wednesday.   Yes Historical Provider, MD  furosemide (LASIX) 80 MG tablet Take 1 tablet (80 mg total) by mouth 2 (two) times daily. Takes 160mg  in the morning and 80mg  at bedtime Patient taking differently: Take 80-160 mg by mouth 2 (two) times daily. Takes 160mg  in the morning and 80mg  at bedtime 06/03/15  Yes Nishant Dhungel, MD  gabapentin (NEURONTIN) 300 MG capsule Take 3 capsules (900 mg total) by mouth 3 (three) times daily. 09/27/14  Yes Adam Telford Nab, DO  imipramine (TOFRANIL) 50 MG tablet Take 1.5 tablets (75 mg total) by mouth every evening. For depressive disorder 01/05/15  Yes Gerlene Fee, NP  latanoprost (XALATAN) 0.005 % ophthalmic solution Place 1 drop into both eyes at bedtime.   Yes Historical Provider, MD  magnesium hydroxide (MILK OF MAGNESIA) 400 MG/5ML suspension Take 30 mLs by mouth every 8 (eight) hours as needed for mild constipation or moderate constipation.   Yes Historical Provider, MD  natalizumab (TYSABRI) 300 MG/15ML injection Inject 300 mg into the vein every 30 (thirty) days.   Yes Historical Provider, MD  oxyCODONE-acetaminophen (PERCOCET) 7.5-325 MG tablet Take 1 tablet by mouth every 6 (six) hours as needed for moderate pain or severe pain.   Yes Historical Provider, MD  phenazopyridine (PYRIDIUM) 100 MG tablet Take 100 mg by mouth 3 (three) times daily.    Yes Historical Provider, MD  polyethylene glycol (MIRALAX / GLYCOLAX) packet Take 17 g by mouth 2 (two) times daily. 01/05/15  Yes Gerlene Fee, NP  potassium chloride (KLOR-CON) 20 MEQ packet Take 20 mEq by mouth daily. 01/16/15  Yes Bonnielee Haff, MD  Propylene Glycol (SYSTANE BALANCE) 0.6 % SOLN Place  1 drop into both eyes 2 (two) times daily.   Yes Historical Provider, MD  solifenacin (VESICARE) 10 MG tablet Take 10 mg by mouth daily. For bladder spasms   Yes Historical Provider, MD  spironolactone (ALDACTONE) 25 MG tablet Take 25 mg by mouth daily. For CHF   Yes Historical Provider, MD  tamsulosin (FLOMAX) 0.4 MG CAPS capsule Take 0.4 mg by mouth daily. For incontinence   Yes Historical Provider, MD  zolpidem (AMBIEN) 10 MG tablet Take 10 mg by mouth at bedtime.   Yes Historical Provider, MD   BP 170/86 mmHg  Pulse 88  Temp(Src) 98.5 F (36.9 C) (Oral)  Resp 20  Wt 223 lb 8.7 oz (101.4 kg)  SpO2 95% Physical Exam  Constitutional: She appears well-developed and well-nourished. No distress.  HENT:  Head: Normocephalic and atraumatic.  Eyes: EOM are normal. Pupils are equal, round, and reactive to light.  Neck: Normal range of motion.  Neck supple.  Cardiovascular: Normal rate and regular rhythm.  Exam reveals no gallop and no friction rub.   No murmur heard. Pulmonary/Chest: Effort normal. She has no wheezes. She has no rales.  Abdominal: Soft. She exhibits no distension. There is no tenderness. There is no rebound and no guarding.  Musculoskeletal: She exhibits no edema or tenderness.  Neurological: She is alert.  Confuse on exam initially only able to repeat I don't feel well after being in the ED for about 30 minutes patient now more responsive though seems confused to some questioning.  Right upper extremity weaker than left upper extremity 4 out of 5. Since states that this is chronic though with her confusion is difficulty to determine. Unable to move bilateral lower extremities.  Skin: Skin is warm and dry. She is not diaphoretic.  Psychiatric: She has a normal mood and affect. Her behavior is normal.  Nursing note and vitals reviewed.   ED Course  Procedures (including critical care time) Labs Review Labs Reviewed  COMPREHENSIVE METABOLIC PANEL - Abnormal; Notable for  the following:    Potassium 3.4 (*)    Creatinine, Ser 1.02 (*)    ALT 99 (*)    Alkaline Phosphatase 191 (*)    All other components within normal limits  CBC WITH DIFFERENTIAL/PLATELET - Abnormal; Notable for the following:    RBC 3.70 (*)    Hemoglobin 10.0 (*)    HCT 30.9 (*)    RDW 16.2 (*)    All other components within normal limits  URINALYSIS, ROUTINE W REFLEX MICROSCOPIC (NOT AT [facility]) - Abnormal; Notable for the following:    Color, Urine ORANGE (*)    APPearance CLOUDY (*)    Nitrite POSITIVE (*)    Leukocytes, UA TRACE (*)    All other components within normal limits  URINE MICROSCOPIC-ADD ON - Abnormal; Notable for the following:    Squamous Epithelial / LPF 6-30 (*)    Bacteria, UA RARE (*)    Casts HYALINE CASTS (*)    All other components within normal limits  CULTURE, BLOOD (ROUTINE X 2)  CULTURE, BLOOD (ROUTINE X 2)  URINE CULTURE  BASIC METABOLIC PANEL  CBC  CBG MONITORING, ED  I-STAT CG4 LACTIC ACID, ED  I-STAT CG4 LACTIC ACID, ED    Imaging Review Ct Head Wo Contrast  06/08/2015  CLINICAL DATA:  Sepsis.  Lethargy and confusion. EXAM: CT HEAD WITHOUT CONTRAST TECHNIQUE: Contiguous axial images were obtained from the base of the skull through the vertex without intravenous contrast. COMPARISON:  01/10/2015 FINDINGS: Mild global atrophy. There are prominent chronic ischemic changes in the periventricular white matter. These findings are stable. No mass effect, midline shift, or acute hemorrhage. Mastoid air cells are clear. Cranium is intact. IMPRESSION: No acute intracranial pathology. Electronically Signed   By: Marybelle Killings M.D.   On: 06/08/2015 16:16   Mr Brain Wo Contrast  06/08/2015  CLINICAL DATA:  Altered mental status. Right-sided weakness. Lethargy and increased confusion. EXAM: MRI HEAD WITHOUT CONTRAST TECHNIQUE: Multiplanar, multiecho pulse sequences of the brain and surrounding structures were obtained without intravenous contrast. COMPARISON:  CT  head without contrast from the same day. MRI brain 10/08/2014 FINDINGS: Extensive periventricular and subcortical T2 changes are stable bilaterally. There is extensive involvement of the colossoseptal margin. No new lesions are present. There is no restricted diffusion. The ventricles are proportionate to the degree of atrophy. No significant extra-axial fluid collection is present. The brainstem and cerebellum are within normal limits.  The internal auditory canals are normal. Flow is present in the major intracranial arteries. The globes and orbits are intact. The paranasal sinuses and mastoid air cells are clear. IMPRESSION: 1. Stable diffuse white matter changes compatible with the given diagnosis of multiple sclerosis. 2. No acute intracranial abnormalities. Electronically Signed   By: San Morelle M.D.   On: 06/08/2015 19:10   Dg Chest Port 1 View  06/08/2015  CLINICAL DATA:  Sepsis, weakness EXAM: PORTABLE CHEST 1 VIEW COMPARISON:  05/30/2015 FINDINGS: Cardiomediastinal silhouette is stable. No acute infiltrate or pleural effusion. No pulmonary edema. Study is limited by poor inspiration. IMPRESSION: No active disease. Electronically Signed   By: Lahoma Crocker M.D.   On: 06/08/2015 15:42   I have personally reviewed and evaluated these images and lab results as part of my medical decision-making.   EKG Interpretation None      MDM   Final diagnoses:  Weakness  Multiple sclerosis exacerbation (HCC)  UTI (lower urinary tract infection)    54 yo F with a chief complaint of lethargy. Difficult to ascertain whether or not she has a urinary tract infection based on ongoing Pyridium use. Will treat with Rocephin. With patient's acute mental status change will admit to the hospital. Likely need MRI when admitted.  Discussed the case with the hospitalist recommended MRI now to ensure proper disposition see their calendar here. Discussed the case with Dr. Nicole Kindred, neurology recommended MRI as  well. MRI negative for stroke. Admit. Consult to neurology for possible MS exacerbation.  The patients results and plan were reviewed and discussed.   Any x-rays performed were independently reviewed by myself.   Differential diagnosis were considered with the presenting HPI.  Medications  carvedilol (COREG) tablet 12.5 mg (not administered)  baclofen (LIORESAL) tablet 20 mg (not administered)  furosemide (LASIX) tablet 80 mg (not administered)  potassium chloride SA (K-DUR,KLOR-CON) CR tablet 20 mEq (not administered)  tamsulosin (FLOMAX) capsule 0.4 mg (not administered)  polyvinyl alcohol (LIQUIFILM TEARS) 1.4 % ophthalmic solution 1 drop (not administered)  imipramine (TOFRANIL) tablet 75 mg (not administered)  polyethylene glycol (MIRALAX / GLYCOLAX) packet 17 g (not administered)  amantadine (SYMMETREL) capsule 100 mg (not administered)  docusate sodium (COLACE) capsule 100 mg (not administered)  DULoxetine (CYMBALTA) DR capsule 60 mg (not administered)  gabapentin (NEURONTIN) capsule 900 mg (not administered)  latanoprost (XALATAN) 0.005 % ophthalmic solution 1 drop (not administered)  darifenacin (ENABLEX) 24 hr tablet 15 mg (not administered)  spironolactone (ALDACTONE) tablet 25 mg (not administered)  cholecalciferol (VITAMIN D) tablet 1,000 Units (not administered)  0.9 %  sodium chloride infusion (not administered)  acetaminophen (TYLENOL) tablet 650 mg (not administered)    Or  acetaminophen (TYLENOL) suppository 650 mg (not administered)  oxyCODONE (Oxy IR/ROXICODONE) immediate release tablet 5 mg (not administered)  HYDROmorphone (DILAUDID) injection 0.5-1 mg (not administered)  ondansetron (ZOFRAN) tablet 4 mg (not administered)    Or  ondansetron (ZOFRAN) injection 4 mg (not administered)  alum & mag hydroxide-simeth (MAALOX/MYLANTA) 200-200-20 MG/5ML suspension 30 mL (not administered)  enoxaparin (LOVENOX) injection 40 mg (not administered)  sodium chloride 0.9 %  injection 3 mL (not administered)  cefTRIAXone (ROCEPHIN) 1 g in dextrose 5 % 50 mL IVPB (not administered)  sodium chloride 0.9 % bolus 1,000 mL (0 mLs Intravenous Stopped 06/08/15 1814)  cefTRIAXone (ROCEPHIN) 1 g in dextrose 5 % 50 mL IVPB (0 g Intravenous Stopped 06/08/15 1611)    Filed Vitals:   06/08/15 2000 06/08/15 2100 06/08/15 2106  06/08/15 2210  BP: 135/63 150/70  170/86  Pulse: 87 90 90 88  Temp:    98.5 F (36.9 C)  TempSrc:    Oral  Resp:    20  Weight:    223 lb 8.7 oz (101.4 kg)  SpO2: 100% 99% 99% 95%    Final diagnoses:  Weakness  Multiple sclerosis exacerbation (HCC)  UTI (lower urinary tract infection)    Admission/ observation were discussed with the admitting physician, patient and/or family and they are comfortable with the plan.      Deno Etienne, DO 06/08/15 2323

## 2015-06-08 NOTE — ED Notes (Signed)
Bed: [facility] Expected date:  Expected time:  Means of arrival:  Comments: EMS- 54yo F, lethargic

## 2015-06-08 NOTE — ED Notes (Addendum)
Per ems pt is from [place], [place]. Pt hx of MS, was admitted for sepsis/ UTI on 11/28-12/2. Today staff reported pt is lethargic and increased confusion. Conflicting documentation on if pt is nonverbal or verbal. At present pt not speaking. Pt did say "I don't feel good". Pt unable to follow directions.

## 2015-06-08 NOTE — Progress Notes (Signed)
Patient ID: Shannon Garrett, female   DOB: June 19, 1961, 54 y.o.   MRN: [id number]    Facility:  [facility]      Allergies  Allergen Reactions  . Sulfonamide Derivatives Hives and Shortness Of Breath  . Penicillins Hives and Itching    Has patient had a PCN reaction causing immediate rash, facial/tongue/throat swelling, SOB or lightheadedness with hypotension: unknown Has patient had a PCN reaction causing severe rash involving mucus membranes or skin necrosis: unknown Has patient had a PCN reaction that required hospitalization: unknown Has patient had a PCN reaction occurring within the last 10 years: unknown If all of the above answers are "NO", then may proceed with Cephalosporin use.     Chief Complaint  Patient presents with  . Acute Visit    change in patient status     HPI:  She is a long term resident of this facility being been by the request of the staff as she is not alert. She is very lethargic; can be aroused is disoriented. The only phrase she would say to questions; "I'm tired, I need to sleep". Her CBG is 113.    Past Medical History  Diagnosis Date  . MS (multiple sclerosis) ([place])     a. Dx'd late 20's. b. Tx with Novantrone, Tysabri, Copaxone previously.  Marland Kitchen Hypertension   . Depression   . Glaucoma   . Cardiomyopathy ([place])     a.  Echo 04/29/12: Mild LVH, EF 20-25%, mild AI, moderate MR, moderate LAE, mild RAE, mild RVE, moderate TR, PASP 51, small pericardial effusion;   b. probably non-ischemic given multiple chemo-Tx agents used for MS and global LV dysfn on echo  . Chronic systolic heart failure ([place])   . CHF (congestive heart failure) ([place])   . Shortness of breath   . Neuromuscular disorder [facility])     MS    Past Surgical History  Procedure Laterality Date  . Ablation      uterine  . Cesarean section      VITAL SIGNS BP 114/83 mmHg  Pulse 80  Resp 16  Ht _0  (1.651 m)  Wt 228 lb (103.42 kg)  BMI 37.94 kg/m2  SpO2 98%  Patient's  Medications  New Prescriptions   No medications on file  Previous Medications   AMANTADINE (SYMMETREL) 100 MG CAPSULE    Take 100 mg by mouth daily.    BACLOFEN (LIORESAL) 20 MG TABLET    Take 20 mg by mouth 3 (three) times daily.   CARVEDILOL (COREG) 12.5 MG TABLET    Take 1 tablet (12.5 mg total) by mouth 2 (two) times daily with a meal.   CHOLECALCIFEROL (VITAMIN D-3 PO)    Take 1 tablet by mouth daily.   CIPROFLOXACIN (CIPRO) 500 MG TABLET    Take 1 tablet (500 mg total) by mouth 2 (two) times daily.   CRANBERRY PO    Take 100 mg by mouth daily.   CYANOCOBALAMIN (VITAMIN B-12 PO)    Take 1 tablet by mouth daily.   DOCUSATE SODIUM (COLACE) 100 MG CAPSULE    Take 100 mg by mouth 2 (two) times daily.   DULOXETINE (CYMBALTA) 60 MG CAPSULE    Take 60 mg by mouth daily.   FLUOCINOLONE ACETONIDE (DERMA-SMOOTHE/FS SCALP) 0.01 % OIL    Apply 1 application topically every Wednesday.   FUROSEMIDE (LASIX) 80 MG TABLET    Take 1 tablet (80 mg total) by mouth 2 (two) times daily. Takes 169m in the morning  and 53m at bedtime   GABAPENTIN (NEURONTIN) 300 MG CAPSULE    Take 3 capsules (900 mg total) by mouth 3 (three) times daily.   IMIPRAMINE (TOFRANIL) 50 MG TABLET    Take 1.5 tablets (75 mg total) by mouth every evening. For depressive disorder   LATANOPROST (XALATAN) 0.005 % OPHTHALMIC SOLUTION    Place 1 drop into both eyes at bedtime.   MAGNESIUM HYDROXIDE (MILK OF MAGNESIA) 400 MG/5ML SUSPENSION    Take 30 mLs by mouth every 8 (eight) hours as needed for mild constipation or moderate constipation.   NATALIZUMAB (TYSABRI) 300 MG/15ML INJECTION    Inject 300 mg into the vein every 30 (thirty) days.   PHENAZOPYRIDINE (PYRIDIUM) 100 MG TABLET    Take 100 mg by mouth 3 (three) times daily.    POLYETHYLENE GLYCOL (MIRALAX / GLYCOLAX) PACKET    Take 17 g by mouth 2 (two) times daily.   POTASSIUM CHLORIDE (KLOR-CON) 20 MEQ PACKET    Take 20 mEq by mouth daily.   PROPYLENE GLYCOL (SYSTANE BALANCE) 0.6 %  SOLN    Place 1 drop into both eyes 2 (two) times daily.   SOLIFENACIN (VESICARE) 10 MG TABLET    Take 10 mg by mouth daily. For bladder spasms   SPIRONOLACTONE (ALDACTONE) 25 MG TABLET    Take 25 mg by mouth daily. For CHF   TAMSULOSIN (FLOMAX) 0.4 MG CAPS CAPSULE    Take 0.4 mg by mouth daily. For incontinence  Modified Medications   No medications on file  Discontinued Medications   No medications on file     SIGNIFICANT DIAGNOSTIC EXAMS   10-08-14: MRI brain: No significant change in diffuse white matter lesions consistent with patient's history of multiple sclerosis. None of these areas demonstrate abnormal restriction or enhancement as may be seen with areas of acute demyelination. Global atrophy. 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 Right parotid 1 cm lesion does not have typical appearance of an intra parotid lymph node and therefore primary parotid lesion is a possibility. ENT consultation may be considered for further delineation.  10-08-14: mri of cervical; spine: Scattered new areas of altered signal intensity (compared to 2007) throughout the cervical cord as detailed above consistent with patient's history of multiple sclerosis. None of these areas demonstrate enhancement as may be seen with areas of active demyelination. Cervical spondylotic changes as detailed above most prominent C5-6 level.  12-13-14: ABI: right: mild atherosclerotic disease   01-10-15: ct of head: No acute intracranial abnormalities. Mild atrophy. White matter disease compatible with history of MS.  01-10-15: chest x-ray: No active disease  01-12-15: 2-d echo: Left ventricle: The cavity size was normal. There was mild concentric hypertrophy. Systolic function was normal. The estimated ejection fraction was in the range of 60% to 65%. Wall motion was normal; there were no regional wall motion abnormalities. Doppler parameters are consistent  with abnormal left ventricular relaxation (grade 1 diastolic dysfunction). - Aortic valve: There was mild regurgitation. - Mitral valve: Calcified annulus. There was mild regurgitation.  05-30-15: chest x-ray: No active disease.    LABS REVIEWED:   07-16-14: wbc 5.3; hgb 11.6; hct 34.0; mcv 80.8 ;plt 273; glucose 86; bun 13; creat 1.01; k+3.9; na++140 08-14-14: tsh 2.03  09-28-14: wbc 5.7 ;hgb 10.0; hct 31.6; mcv 84.5; plt 323; glucose 87; bun 16.4; creat 1.00; k+3.5; na++142; liver normal albumin 3.7; vit d 33.41 10-05-14: wbc  5.0; hgb 10.8; hct 32.2; mcv 82.8; plt 309; bun 94; bun 24; creat 1.02; k+3.3; na++139; liver normal albumin 3.7; urine culture: enterococcus  12-16-14: wbc 5.9; hgb 10.8; hct 35.6; mcv 87.4; plt 273; glucose 111; bun 39.1; creat 1.30; k+3.9; na++139; liver normal albumin 3.9  01-10-15: wbc 6.2; hgb 11.3; hct 34.0; mcv 84.2; plt 291; glucose 101; bun 21; creat 0.89; k+4.5; na++140; alk phos 138; albumin 3.4; ;urine culture: enterococcus 01-11-15: wbc 6.3; hgb 10.2; hct 31.2; mcv 83.9; ;plt 256; glucose 112; bun 17; creat 0.79; k+3.9; na++145; liver normal albumin 3.1; hgb a1c 5.3; tsh 1.209 01-12-15: HIV: nr; RPR: nr; ammonia 30; iron 50; tibc 253; folate 24.2; vit b 12: 2487  04-18-15: enterococcus faecalis: macrobid  05-30-15: urine culture: multiple bacteria  06-02-15: wbc 12.2; hgb 10.4; hct 32.0; mcv 84.7; plt 257; glucose 119; bun 18; creat 0.99; k+ 3.5; na++150; liver normal albumin 3.3     Review of Systems  Unable to perform ROS: medical condition      Physical Exam Constitutional: She is oriented to person, place, and time. No distress.  Overweight   Neck: Neck supple. No JVD present. No thyromegaly present.  Cardiovascular: Normal rate, regular rhythm and intact distal pulses.   Respiratory: Effort normal and breath sounds normal. No respiratory distress.  GI: Soft. Bowel sounds are normal. She exhibits no distension. There is no tenderness.    Musculoskeletal: She exhibits no edema.  Is able to move right  upper extremity Limited movement in her lower extremities  and left upper extremity  Neurological: disoriented  .  Skin: Skin is warm and dry. She is not diaphoretic.      ASSESSMENT/ PLAN:  1. MS 2. Question CVA Will send her to the ED for further evaluation and treatment options.    Time spent with patient  45  minutes >50% time spent counseling; reviewing medical record; tests; labs; and developing future plan of care   Ok Edwards NP [facility]  Contact [phone] Monday through Friday 8am- 5pm  After hours call [phone]

## 2015-06-09 ENCOUNTER — Encounter (HOSPITAL_COMMUNITY): Payer: Self-pay | Admitting: *Deleted

## 2015-06-09 DIAGNOSIS — R4182 Altered mental status, unspecified: Secondary | ICD-10-CM

## 2015-06-09 DIAGNOSIS — G35 Multiple sclerosis: Principal | ICD-10-CM

## 2015-06-09 LAB — CBC
HEMATOCRIT: 29.9 % — AB (ref 36.0–46.0)
Hemoglobin: 9.7 g/dL — ABNORMAL LOW (ref 12.0–15.0)
MCH: 26.7 pg (ref 26.0–34.0)
MCHC: 32.4 g/dL (ref 30.0–36.0)
MCV: 82.4 fL (ref 78.0–100.0)
PLATELETS: 293 10*3/uL (ref 150–400)
RBC: 3.63 MIL/uL — ABNORMAL LOW (ref 3.87–5.11)
RDW: 15.9 % — AB (ref 11.5–15.5)
WBC: 7.3 10*3/uL (ref 4.0–10.5)

## 2015-06-09 LAB — BASIC METABOLIC PANEL
Anion gap: 7 (ref 5–15)
BUN: 10 mg/dL (ref 6–20)
CHLORIDE: 105 mmol/L (ref 101–111)
CO2: 28 mmol/L (ref 22–32)
CREATININE: 0.93 mg/dL (ref 0.44–1.00)
Calcium: 9.3 mg/dL (ref 8.9–10.3)
GFR calc Af Amer: >60 mL/min (ref >60)
GFR calc non Af Amer: >60 mL/min (ref >60)
Glucose, Bld: 100 mg/dL — ABNORMAL HIGH (ref 65–99)
POTASSIUM: 3.1 mmol/L — AB (ref 3.5–5.1)
SODIUM: 140 mmol/L (ref 135–145)

## 2015-06-09 LAB — URINE CULTURE: CULTURE: NO GROWTH

## 2015-06-09 MED ORDER — HYDRALAZINE HCL 20 MG/ML IJ SOLN: 10.0000 mg | Freq: Four times a day (QID) | INTRAMUSCULAR | Status: DC | PRN

## 2015-06-09 NOTE — NC FL2 (Signed)
[place] LEVEL OF CARE SCREENING TOOL     IDENTIFICATION  Patient Name: Shannon Garrett Birthdate: 10/20/1960 Sex: female Admission Date (Current Location): 06/08/2015  [facility] and [place] Number:     Facility and Address:         Provider Number: [id number]  Attending Physician Name and Address:  Velvet Bathe, MD  Relative Name and Phone Number:       Current Level of Care:   Recommended Level of Care:   Prior Approval Number:    Date Approved/Denied:   PASRR Number:    Discharge Plan:      Current Diagnoses: Patient Active Problem List   Diagnosis Date Noted  . Multiple sclerosis exacerbation ([place]) 06/08/2015  . Weakness 06/08/2015  . Chronic systolic (congestive) heart failure ([place]) 06/08/2015  . Acute encephalopathy 06/03/2015  . Acute kidney injury ([place]) 06/03/2015  . Hypokalemia 06/03/2015  . Sepsis secondary to UTI ([place]) 05/30/2015  . Hypernatremia 05/30/2015  . Dehydration 05/30/2015  . Chronic anemia 05/30/2015  . Chronic pain syndrome 03/03/2015  . Benign hypertensive heart disease without heart failure 01/18/2015  . Urinary tract infection due to Enterococcus   . Essential hypertension   . Abnormal LFTs   . Foot drop, bilateral   . Pressure ulcer 01/12/2015  . UTI (lower urinary tract infection) 01/11/2015  . Hypovolemia 01/11/2015  . UTI (urinary tract infection), uncomplicated   . Cardiomyopathy ([place])   . Urinary incontinence 01/07/2015  . Chronic constipation 11/26/2014  . Depression 11/26/2014  . Tachycardia 02/20/2014  . Venous stasis ulcers ([place]) 02/20/2014  . Leg wound, left 11/04/2013  . FTT (failure to thrive) in adult 07/31/2013  . Medically noncompliant 07/31/2013  . Blisters of multiple sites 07/31/2013  . Generalized weakness 06/03/2013  . Insomnia 06/03/2013  . Neurogenic bladder 06/03/2013  . DS (disseminated sclerosis) ([place]) 02/12/2013  . Chronic diastolic heart failure ([place]) 09/29/2012  . Elevated CK  09/10/2012  . Severe malnutrition ([place]) 09/05/2012  . Paraplegia ([place]) 09/05/2012  . Fall 09/04/2012  . Chronic combined systolic and diastolic congestive heart failure ([place]) 05/22/2012  . Other primary cardiomyopathies 05/01/2012  . Immobility 04/28/2012  . OBESITY 11/30/2008  . ANEMIA, CHRONIC 11/30/2008  . MULTIPLE SCLEROSIS, PROGRESSIVE/RELAPSING 11/30/2008  . HYPERTENSION, BENIGN ESSENTIAL 11/30/2008  . ECZEMA 11/30/2008    Orientation ACTIVITIES/SOCIAL BLADDER RESPIRATION    Self  Passive Continent, Incontinent    BEHAVIORAL SYMPTOMS/MOOD NEUROLOGICAL BOWEL NUTRITION STATUS      Incontinent    PHYSICIAN VISITS COMMUNICATION OF NEEDS Height & Weight Skin    Verbally 5\' 5"  (165.1 cm) 216 lbs. Other (Comment)          AMBULATORY STATUS RESPIRATION             Personal Care Assistance Level of Assistance  Bathing, Feeding, Dressing Bathing Assistance: Maximum assistance Feeding assistance: Limited assistance Dressing Assistance: Maximum assistance      Functional Limitations Info    Sight Info: Adequate Hearing Info: Adequate Speech Info: Adequate       SPECIAL CARE FACTORS FREQUENCY                      Additional Factors Info                  Current Medications (06/09/2015):  This is the current hospital active medication list Current Facility-Administered Medications  Medication Dose Route Frequency Provider Last Rate Last Dose  . 0.9 %  sodium chloride infusion  Intravenous Continuous Theressa Millard, MD 50 mL/hr at 06/08/15 2334    . acetaminophen (TYLENOL) tablet 650 mg  650 mg Oral Q6H PRN Theressa Millard, MD       Or  . acetaminophen (TYLENOL) suppository 650 mg  650 mg Rectal Q6H PRN Theressa Millard, MD      . alum & mag hydroxide-simeth (MAALOX/MYLANTA) 200-200-20 MG/5ML suspension 30 mL  30 mL Oral Q6H PRN Theressa Millard, MD      . amantadine (SYMMETREL) capsule 100 mg  100 mg Oral Daily Theressa Millard, MD   100 mg  at 06/09/15 1136  . baclofen (LIORESAL) tablet 20 mg  20 mg Oral TID Theressa Millard, MD   20 mg at 06/09/15 1121  . carvedilol (COREG) tablet 12.5 mg  12.5 mg Oral BID WC Theressa Millard, MD   12.5 mg at 06/09/15 [id number]  . cefTRIAXone (ROCEPHIN) 1 g in dextrose 5 % 50 mL IVPB  1 g Intravenous Q24H Theressa Millard, MD      . cholecalciferol (VITAMIN D) tablet 1,000 Units  1,000 Units Oral Daily Theressa Millard, MD   1,000 Units at 06/09/15 1128  . darifenacin (ENABLEX) 24 hr tablet 15 mg  15 mg Oral Daily Theressa Millard, MD   15 mg at 06/09/15 1121  . docusate sodium (COLACE) capsule 100 mg  100 mg Oral BID Theressa Millard, MD   100 mg at 06/09/15 1126  . DULoxetine (CYMBALTA) DR capsule 60 mg  60 mg Oral Daily Theressa Millard, MD   60 mg at 06/09/15 1123  . enoxaparin (LOVENOX) injection 40 mg  40 mg Subcutaneous QHS Theressa Millard, MD   40 mg at 06/08/15 2335  . furosemide (LASIX) tablet 80 mg  80 mg Oral BID Theressa Millard, MD   80 mg at 06/09/15 0824  . gabapentin (NEURONTIN) capsule 900 mg  900 mg Oral TID Theressa Millard, MD   900 mg at 06/09/15 1127  . hydrALAZINE (APRESOLINE) injection 10 mg  10 mg Intravenous Q6H PRN Theressa Millard, MD      . HYDROmorphone (DILAUDID) injection 0.5-1 mg  0.5-1 mg Intravenous Q3H PRN Theressa Millard, MD      . imipramine (TOFRANIL) tablet 75 mg  75 mg Oral QPM Theressa Millard, MD   75 mg at 06/08/15 2336  . latanoprost (XALATAN) 0.005 % ophthalmic solution 1 drop  1 drop Both Eyes QHS Theressa Millard, MD   1 drop at 06/08/15 2336  . ondansetron (ZOFRAN) tablet 4 mg  4 mg Oral Q6H PRN Theressa Millard, MD       Or  . ondansetron (ZOFRAN) injection 4 mg  4 mg Intravenous Q6H PRN Theressa Millard, MD      . oxyCODONE (Oxy IR/ROXICODONE) immediate release tablet 5 mg  5 mg Oral Q4H PRN Theressa Millard, MD      . polyethylene glycol (MIRALAX / GLYCOLAX) packet 17 g  17 g Oral BID Theressa Millard, MD   17 g at  06/09/15 1126  . polyvinyl alcohol (LIQUIFILM TEARS) 1.4 % ophthalmic solution 1 drop  1 drop Both Eyes BID Theressa Millard, MD   1 drop at 06/09/15 1111  . potassium chloride SA (K-DUR,KLOR-CON) CR tablet 20 mEq  20 mEq Oral Daily Theressa Millard, MD   20 mEq at 06/09/15 1128  . sodium chloride 0.9 % injection 3 mL  3 mL Intravenous Q12H Theressa Millard, MD   3 mL at 06/08/15 2337  . spironolactone (ALDACTONE) tablet 25 mg  25 mg Oral Daily Theressa Millard, MD   25 mg at 06/09/15 1125  . tamsulosin (FLOMAX) capsule 0.4 mg  0.4 mg Oral Daily Theressa Millard, MD   0.4 mg at 06/09/15 1124     Discharge Medications: Please see discharge summary for a list of discharge medications.  Relevant Imaging Results:  Relevant Lab Results:  Recent Labs    Additional Information SS # [id number]  Ludwig Clarks, LCSW

## 2015-06-09 NOTE — Consult Note (Signed)
Admission H&P    Chief Complaint: Altered mental status and reduced movements of right extremities.  HPI: Shannon Garrett is an 54 y.o. female with a history of multiple sclerosis, hypertension and cardiomyopathy, brought to the emergency room from the SNF where she resides for increasing lethargy as well as reduced movements of right extremities. Patient at baseline is nonambulatory, but this typically alert, conversant and oriented. Symptoms started 2 days prior to admission. She had similar presentation about 10 days ago with acute urinary tract infection. MRI of her brain was obtained which showed no acute intracranial abnormality. White matter changes attributed to multiple sclerosis were unchanged from study obtained on 10/08/2014. Urinalysis showed findings indicative of recurrent acute urinary tract infection. Patient has been on Cipro and Pyridium at the SNF. Urine cultures as well as blood cultures were obtained and results are pending. She was started on IV Rocephin. Patient has been afebrile.  Past Medical History  Diagnosis Date  . MS (multiple sclerosis) ([place])     a. Dx'd late 20's. b. Tx with Novantrone, Tysabri, Copaxone previously.  Marland Kitchen Hypertension   . Depression   . Glaucoma   . Cardiomyopathy ([place])     a.  Echo 04/29/12: Mild LVH, EF 20-25%, mild AI, moderate MR, moderate LAE, mild RAE, mild RVE, moderate TR, PASP 51, small pericardial effusion;   b. probably non-ischemic given multiple chemo-Tx agents used for MS and global LV dysfn on echo  . Chronic systolic heart failure [facility])     Past Surgical History  Procedure Laterality Date  . Ablation      uterine  . Cesarean section      Family History  Problem Relation Age of Onset  . Hypertension Mother   . Heart attack Neg Hx   . Cancer Mother     breast   . Cancer Father     prostate  . Multiple sclerosis Sister    Social History:  reports that she has quit smoking. She has never used smokeless tobacco. She reports  that she does not drink alcohol or use illicit drugs.  Allergies:  Allergies  Allergen Reactions  . Sulfonamide Derivatives Hives and Shortness Of Breath  . Penicillins Hives and Itching    Has patient had a PCN reaction causing immediate rash, facial/tongue/throat swelling, SOB or lightheadedness with hypotension: unknown Has patient had a PCN reaction causing severe rash involving mucus membranes or skin necrosis: unknown Has patient had a PCN reaction that required hospitalization: unknown Has patient had a PCN reaction occurring within the last 10 years: unknown If all of the above answers are "NO", then may proceed with Cephalosporin use. Prior course of rocephin charted 05/2015    Medications Prior to Admission  Medication Sig Dispense Refill  . amantadine (SYMMETREL) 100 MG capsule Take 100 mg by mouth daily.     . baclofen (LIORESAL) 20 MG tablet Take 20 mg by mouth 3 (three) times daily.    . carvedilol (COREG) 12.5 MG tablet Take 1 tablet (12.5 mg total) by mouth 2 (two) times daily with a meal.    . Cholecalciferol (VITAMIN D-3 PO) Take 1 tablet by mouth daily.    . ciprofloxacin (CIPRO) 500 MG tablet Take 1 tablet (500 mg total) by mouth 2 (two) times daily. 12 tablet 0  . CRANBERRY PO Take 100 mg by mouth daily.    . Cyanocobalamin (VITAMIN B-12 PO) Take 1 tablet by mouth daily.    Marland Kitchen docusate sodium (COLACE) 100 MG capsule  Take 100 mg by mouth 2 (two) times daily.    . DULoxetine (CYMBALTA) 60 MG capsule Take 60 mg by mouth daily.    . Fluocinolone Acetonide (DERMA-SMOOTHE/FS SCALP) 0.01 % OIL Apply 1 application topically every Wednesday.    . furosemide (LASIX) 80 MG tablet Take 1 tablet (80 mg total) by mouth 2 (two) times daily. Takes 169m in the morning and 864mat bedtime (Patient taking differently: Take 80-160 mg by mouth 2 (two) times daily. Takes 16023mn the morning and 71m40m bedtime) 30 tablet 0  . gabapentin (NEURONTIN) 300 MG capsule Take 3 capsules (900 mg  total) by mouth 3 (three) times daily. 270 capsule 5  . imipramine (TOFRANIL) 50 MG tablet Take 1.5 tablets (75 mg total) by mouth every evening. For depressive disorder 90 tablet 11  . latanoprost (XALATAN) 0.005 % ophthalmic solution Place 1 drop into both eyes at bedtime.    . magnesium hydroxide (MILK OF MAGNESIA) 400 MG/5ML suspension Take 30 mLs by mouth every 8 (eight) hours as needed for mild constipation or moderate constipation.    . natalizumab (TYSABRI) 300 MG/15ML injection Inject 300 mg into the vein every 30 (thirty) days.    . oxMarland KitchenCODONE-acetaminophen (PERCOCET) 7.5-325 MG tablet Take 1 tablet by mouth every 6 (six) hours as needed for moderate pain or severe pain.    . phenazopyridine (PYRIDIUM) 100 MG tablet Take 100 mg by mouth 3 (three) times daily.     . polyethylene glycol (MIRALAX / GLYCOLAX) packet Take 17 g by mouth 2 (two) times daily. 120 packet 11  . potassium chloride (KLOR-CON) 20 MEQ packet Take 20 mEq by mouth daily.    . PrMarland Kitchenpylene Glycol (SYSTANE BALANCE) 0.6 % SOLN Place 1 drop into both eyes 2 (two) times daily.    . solifenacin (VESICARE) 10 MG tablet Take 10 mg by mouth daily. For bladder spasms    . spironolactone (ALDACTONE) 25 MG tablet Take 25 mg by mouth daily. For CHF    . tamsulosin (FLOMAX) 0.4 MG CAPS capsule Take 0.4 mg by mouth daily. For incontinence    . zolpidem (AMBIEN) 10 MG tablet Take 10 mg by mouth at bedtime.      ROS: Source: Chart reviewed Constitutional: No Weight Loss, No Weight Gain, Night Sweats, Fevers, Chills, Dizziness, Light Headedness, Fatigue, +Generalized Weakness HEENT: No Headaches, Difficulty Swallowing,Tooth/Dental Problems,Sore Throat,  No Sneezing, Rhinitis, Ear Ache, Nasal Congestion, or Post Nasal Drip,  Cardio-vascular: No Chest pain, Orthopnea, PND, Edema in Lower Extremities, Anasarca, Dizziness, Palpitations  Resp: No Dyspnea, No DOE, No Productive Cough, No Non-Productive Cough, No Hemoptysis, No Wheezing.   GI: No Heartburn, Indigestion, Abdominal Pain, Nausea, Vomiting, Diarrhea, Constipation, Hematemesis, Hematochezia, Melena, Change in Bowel Habits, Loss of Appetite  GU: No Dysuria, No Change in Color of Urine, No Urgency or Urinary Frequency, No Flank pain.  Musculoskeletal: No Joint Pain or Swelling, No Decreased Range of Motion, No Back Pain.  Neurologic: No Syncope, No Seizures, Muscle Weakness, Paresthesia, Vision Disturbance or Loss, No Diplopia, No Vertigo, No Difficulty Walking,  Skin: No Rash or Lesions. Psych: No Change in Mood or Affect, No Depression or Anxiety, No Memory loss, No Confusion, or Hallucinations   Physical Examination: Blood pressure 163/89, pulse 101, temperature 98.6 F (37 C), temperature source Oral, resp. rate 20, weight 101.4 kg (223 lb 8.7 oz), SpO2 100 %.  HEENT-  Normocephalic, no lesions, without obvious abnormality.  Normal external eye and conjunctiva.  Normal TM's bilaterally.  Normal auditory canals and external ears. Normal external nose, mucus membranes and septum.  Normal pharynx. Neck supple with no masses, nodes, nodules or enlargement. Cardiovascular - regular rate and rhythm, S1, S2 normal, no murmur, click, rub or gallop Lungs - chest clear, no wheezing, rales, normal symmetric air entry Abdomen - soft, non-tender; bowel sounds normal; no masses,  no organomegaly  Neurologic Examination: Patient was awake but confused. She was disoriented to time as well as place. She also had visual and auditory hallucinations. Pupils were somewhat dilated and right pupil was slightly larger than the left. Both pupils reacted readily to light. Extraocular movements were full and conjugate. Visual fields were intact and normal. No facial weakness was noted. Speech was moderately slurred. Patient had moderately severe weakness of upper extremities, proximally more than distally in the left slightly more so than right. She had no voluntary movement of  lower extremities other than movement of toes bilaterally. Muscle tone was increased diffusely, left greater than right. Deep tendon reflexes were 1+ and symmetrical in upper extremities and trace only at the knees. Plantar responses were mute bilaterally.  Results for orders placed or performed during the hospital encounter of 06/08/15 (from the past 48 hour(s))  CBG monitoring, ED     Status: None   Collection Time: 06/08/15  1:04 PM  Result Value Ref Range   Glucose-Capillary 82 65 - 99 mg/dL   Comment 1 Notify RN   Comprehensive metabolic panel     Status: Abnormal   Collection Time: 06/08/15  1:26 PM  Result Value Ref Range   Sodium 145 135 - 145 mmol/L   Potassium 3.4 (L) 3.5 - 5.1 mmol/L   Chloride 105 101 - 111 mmol/L   CO2 31 22 - 32 mmol/L   Glucose, Bld 98 65 - 99 mg/dL   BUN 11 6 - 20 mg/dL   Creatinine, Ser 1.02 (H) 0.44 - 1.00 mg/dL   Calcium 9.3 8.9 - 10.3 mg/dL   Total Protein 6.9 6.5 - 8.1 g/dL   Albumin 3.9 3.5 - 5.0 g/dL   AST 33 15 - 41 U/L   ALT 99 (H) 14 - 54 U/L   Alkaline Phosphatase 191 (H) 38 - 126 U/L   Total Bilirubin 0.4 0.3 - 1.2 mg/dL   GFR calc non Af Amer >60 >60 mL/min   GFR calc Af Amer >60 >60 mL/min    Comment: (NOTE) The eGFR has been calculated using the CKD EPI equation. This calculation has not been validated in all clinical situations. eGFR's persistently <60 mL/min signify possible Chronic Kidney Disease.    Anion gap 9 5 - 15  CBC with Differential     Status: Abnormal   Collection Time: 06/08/15  1:26 PM  Result Value Ref Range   WBC 6.4 4.0 - 10.5 K/uL   RBC 3.70 (L) 3.87 - 5.11 MIL/uL   Hemoglobin 10.0 (L) 12.0 - 15.0 g/dL   HCT 30.9 (L) 36.0 - 46.0 %   MCV 83.5 78.0 - 100.0 fL   MCH 27.0 26.0 - 34.0 pg   MCHC 32.4 30.0 - 36.0 g/dL   RDW 16.2 (H) 11.5 - 15.5 %   Platelets 282 150 - 400 K/uL   Neutrophils Relative % 42 %   Neutro Abs 2.7 1.7 - 7.7 K/uL   Lymphocytes Relative 40 %   Lymphs Abs 2.5 0.7 - 4.0 K/uL    Monocytes Relative 9 %   Monocytes Absolute 0.6 0.1 - 1.0 K/uL  Eosinophils Relative 8 %   Eosinophils Absolute 0.5 0.0 - 0.7 K/uL   Basophils Relative 1 %   Basophils Absolute 0.1 0.0 - 0.1 K/uL  I-Stat CG4 Lactic Acid, ED (Not at [facility])     Status: None   Collection Time: 06/08/15  2:03 PM  Result Value Ref Range   Lactic Acid, Venous 0.72 0.5 - 2.0 mmol/L  Urinalysis, Routine w reflex microscopic (not [facility])     Status: Abnormal   Collection Time: 06/08/15  2:04 PM  Result Value Ref Range   Color, Urine ORANGE (A) YELLOW    Comment: BIOCHEMICALS MAY BE AFFECTED BY COLOR   APPearance CLOUDY (A) CLEAR   Specific Gravity, Urine 1.015 1.005 - 1.030   pH 5.5 5.0 - 8.0   Glucose, UA NEGATIVE NEGATIVE mg/dL   Hgb urine dipstick NEGATIVE NEGATIVE   Bilirubin Urine NEGATIVE NEGATIVE   Ketones, ur NEGATIVE NEGATIVE mg/dL   Protein, ur NEGATIVE NEGATIVE mg/dL   Nitrite POSITIVE (A) NEGATIVE   Leukocytes, UA TRACE (A) NEGATIVE  Urine culture     Status: None (Preliminary result)   Collection Time: 06/08/15  2:04 PM  Result Value Ref Range   Specimen Description URINE, CATHETERIZED    Special Requests NONE    Culture      NO GROWTH < 24 HOURS Performed at [facility]    Report Status PENDING   Urine microscopic-add on     Status: Abnormal   Collection Time: 06/08/15  2:04 PM  Result Value Ref Range   Squamous Epithelial / LPF 6-30 (A) NONE SEEN   WBC, UA 0-5 0 - 5 WBC/hpf   RBC / HPF 0-5 0 - 5 RBC/hpf   Bacteria, UA RARE (A) NONE SEEN   Casts HYALINE CASTS (A) NEGATIVE   Urine-Other MUCOUS PRESENT   I-Stat CG4 Lactic Acid, ED (Not at [facility])     Status: None   Collection Time: 06/08/15  4:42 PM  Result Value Ref Range   Lactic Acid, Venous 0.56 0.5 - 2.0 mmol/L  Basic metabolic panel     Status: Abnormal   Collection Time: 06/09/15  5:40 AM  Result Value Ref Range   Sodium 140 135 - 145 mmol/L   Potassium 3.1 (L) 3.5 - 5.1 mmol/L   Chloride 105 101 - 111 mmol/L    CO2 28 22 - 32 mmol/L   Glucose, Bld 100 (H) 65 - 99 mg/dL   BUN 10 6 - 20 mg/dL   Creatinine, Ser 0.93 0.44 - 1.00 mg/dL   Calcium 9.3 8.9 - 10.3 mg/dL   GFR calc non Af Amer >60 >60 mL/min   GFR calc Af Amer >60 >60 mL/min    Comment: (NOTE) The eGFR has been calculated using the CKD EPI equation. This calculation has not been validated in all clinical situations. eGFR's persistently <60 mL/min signify possible Chronic Kidney Disease.    Anion gap 7 5 - 15  CBC     Status: Abnormal   Collection Time: 06/09/15  5:40 AM  Result Value Ref Range   WBC 7.3 4.0 - 10.5 K/uL   RBC 3.63 (L) 3.87 - 5.11 MIL/uL   Hemoglobin 9.7 (L) 12.0 - 15.0 g/dL   HCT 29.9 (L) 36.0 - 46.0 %   MCV 82.4 78.0 - 100.0 fL   MCH 26.7 26.0 - 34.0 pg   MCHC 32.4 30.0 - 36.0 g/dL   RDW 15.9 (H) 11.5 - 15.5 %   Platelets 293  150 - 400 K/uL   Ct Head Wo Contrast  06/08/2015  CLINICAL DATA:  Sepsis.  Lethargy and confusion. EXAM: CT HEAD WITHOUT CONTRAST TECHNIQUE: Contiguous axial images were obtained from the base of the skull through the vertex without intravenous contrast. COMPARISON:  01/10/2015 FINDINGS: Mild global atrophy. There are prominent chronic ischemic changes in the periventricular white matter. These findings are stable. No mass effect, midline shift, or acute hemorrhage. Mastoid air cells are clear. Cranium is intact. IMPRESSION: No acute intracranial pathology. Electronically Signed   By: Marybelle Killings M.D.   On: 06/08/2015 16:16   Mr Brain Wo Contrast  06/08/2015  CLINICAL DATA:  Altered mental status. Right-sided weakness. Lethargy and increased confusion. EXAM: MRI HEAD WITHOUT CONTRAST TECHNIQUE: Multiplanar, multiecho pulse sequences of the brain and surrounding structures were obtained without intravenous contrast. COMPARISON:  CT head without contrast from the same day. MRI brain 10/08/2014 FINDINGS: Extensive periventricular and subcortical T2 changes are stable bilaterally. There is extensive  involvement of the colossoseptal margin. No new lesions are present. There is no restricted diffusion. The ventricles are proportionate to the degree of atrophy. No significant extra-axial fluid collection is present. The brainstem and cerebellum are within normal limits. The internal auditory canals are normal. Flow is present in the major intracranial arteries. The globes and orbits are intact. The paranasal sinuses and mastoid air cells are clear. IMPRESSION: 1. Stable diffuse white matter changes compatible with the given diagnosis of multiple sclerosis. 2. No acute intracranial abnormalities. Electronically Signed   By: San Morelle M.D.   On: 06/08/2015 19:10   Dg Chest Port 1 View  06/08/2015  CLINICAL DATA:  Sepsis, weakness EXAM: PORTABLE CHEST 1 VIEW COMPARISON:  05/30/2015 FINDINGS: Cardiomediastinal silhouette is stable. No acute infiltrate or pleural effusion. No pulmonary edema. Study is limited by poor inspiration. IMPRESSION: No active disease. Electronically Signed   By: Lahoma Crocker M.D.   On: 06/08/2015 15:42    Assessment/Plan 54 year old lady with multiple sclerosis with debilitated state with probable mental status changes and exacerbation of weakness due to intercurrent urinary tract infection and possible sepsis. Acute MS exacerbation is less likely, with no clear new focal deficit, as well as no acute lesion on MRI study of her brain. Patient has signs of acute delirium, which is probably related to her acute infectious process.  Recommendations: 1. No changes in current management, including continuing maintenance and supportive medications for multiple sclerosis. 2. No indication for short-term high-dose steroid treatment, as acute MS exacerbation is unlikely.  3. No further neurodiagnostic studies are indicated at this point. 4. Physical therapy and occupational therapy consults  C.R. Nicole Kindred, MD [facility] [phone]  06/09/2015, 9:44 AM

## 2015-06-09 NOTE — Progress Notes (Signed)
Patient refusing to take her evening medicaitons (coreg, lasix and tofranil).  Spitting out medications when put in her mouth.  Educated patient on importance of taking medications, patient continuing to refused.  MD made aware.

## 2015-06-09 NOTE — Progress Notes (Signed)
[facility] PROGRESS NOTE  Shannon Garrett [id number] DOB: 04-05-1961 DOA: 06/08/2015 PCP: Hennie Duos, MD  Assessment/Plan: Principal Problem:   Multiple sclerosis exacerbation [facility]) - Neurology on board who is currently not recommending any changes in current management. - No further neurodiagnostic studies indicated at this point.  Active Problems:   HYPERTENSION, BENIGN ESSENTIAL - Patient currently on carvedilol and Aldactone.    UTI (lower urinary tract infection) - Pt is currently on Rocephin. - will f/u with urine culture    Weakness - Secondary to UTI and MS exacerbation    Chronic systolic (congestive) heart failure (HCC) - stable continue current regimen   Code Status: full Family Communication: discussed with patient Disposition Plan: Pending improvement in condition   Consultants:  Neurology  Procedures:  None  Antibiotics:  Rocephin  HPI/Subjective: Pt has no new complaints. No acute issues reported overnight.  Objective: Filed Vitals:   06/09/15 0531 06/09/15 1341  BP: 163/89 147/97  Pulse: 101 96  Temp: 98.6 F (37 C) 98.5 F (36.9 C)  Resp: 20 19    Intake/Output Summary (Last 24 hours) at 06/09/15 1451 Last data filed at 06/09/15 1250  Gross per 24 hour  Intake    180 ml  Output      0 ml  Net    180 ml   Filed Weights   06/08/15 1316 06/08/15 2210  Weight: 103.42 kg (228 lb) 101.4 kg (223 lb 8.7 oz)    Exam: Patient refused physical exam. Evaluation visual  General:  Pt in nad, alert and awake  Cardiovascular: No cyanosis  Respiratory: No increased wob, equal chest rise  Abdomen: nd, no guarding  Musculoskeletal: no cyanosis on limited exam.   Data Reviewed: Basic Metabolic Panel:  Recent Labs Lab 06/03/15 0515 06/08/15 1326 06/09/15 0540  NA 141 145 140  K 3.1* 3.4* 3.1*  CL 108 105 105  CO2 24 31 28   GLUCOSE 108* 98 100*  BUN 13 11 10   CREATININE 0.81 1.02* 0.93  CALCIUM 8.8* 9.3 9.3    Liver Function Tests:  Recent Labs Lab 06/08/15 1326  AST 33  ALT 99*  ALKPHOS 191*  BILITOT 0.4  PROT 6.9  ALBUMIN 3.9   No results for input(s): LIPASE, AMYLASE in the last 168 hours. No results for input(s): AMMONIA in the last 168 hours. CBC:  Recent Labs Lab 06/03/15 0515 06/08/15 1326 06/09/15 0540  WBC 10.1 6.4 7.3  NEUTROABS  --  2.7  --   HGB 9.9* 10.0* 9.7*  HCT 29.5* 30.9* 29.9*  MCV 83.3 83.5 82.4  PLT 228 282 293   Cardiac Enzymes: No results for input(s): CKTOTAL, CKMB, CKMBINDEX, TROPONINI in the last 168 hours. BNP (last 3 results) No results for input(s): BNP in the last 8760 hours.  ProBNP (last 3 results) No results for input(s): PROBNP in the last 8760 hours.  CBG:  Recent Labs Lab 06/08/15 1304  GLUCAP 82    Recent Results (from the past 240 hour(s))  Culture, blood (routine x 2)     Status: None   Collection Time: 05/30/15  8:15 PM  Result Value Ref Range Status   Specimen Description BLOOD LEFT HAND  Final   Special Requests BOTTLES DRAWN AEROBIC AND ANAEROBIC 5CC  Final   Culture   Final    NO GROWTH 5 DAYS Performed at [facility]    Report Status 06/05/2015 FINAL  Final  Urine culture     Status: None  Collection Time: 05/30/15  9:17 PM  Result Value Ref Range Status   Specimen Description URINE, CATHETERIZED  Final   Special Requests NONE  Final   Culture   Final    MULTIPLE SPECIES PRESENT, SUGGEST RECOLLECTION Performed at [name]    Report Status 06/02/2015 FINAL  Final  Culture, blood (routine x 2)     Status: None   Collection Time: 05/30/15  9:40 PM  Result Value Ref Range Status   Specimen Description BLOOD RIGHT ANTECUBITAL  Final   Special Requests BOTTLES DRAWN AEROBIC AND ANAEROBIC 5CC  Final   Culture   Final    NO GROWTH 5 DAYS Performed at [facility]    Report Status 06/05/2015 FINAL  Final  MRSA PCR Screening     Status: None   Collection Time: 05/31/15  4:30 AM   Result Value Ref Range Status   MRSA by PCR NEGATIVE NEGATIVE Final    Comment:        The GeneXpert MRSA Assay (FDA approved for NASAL specimens only), is one component of a comprehensive MRSA colonization surveillance program. It is not intended to diagnose MRSA infection nor to guide or monitor treatment for MRSA infections.   Culture, blood (routine x 2)     Status: None (Preliminary result)   Collection Time: 06/08/15  1:00 PM  Result Value Ref Range Status   Specimen Description BLOOD LEFT FOREARM  Final   Special Requests BOTTLES DRAWN AEROBIC ONLY 6ML  Final   Culture   Final    NO GROWTH < 24 HOURS Performed at [facility]    Report Status PENDING  Incomplete  Culture, blood (routine x 2)     Status: None (Preliminary result)   Collection Time: 06/08/15  1:55 PM  Result Value Ref Range Status   Specimen Description BLOOD RIGHT ANTECUBITAL  Final   Special Requests BOTTLES DRAWN AEROBIC ONLY 8ML  Final   Culture   Final    NO GROWTH < 24 HOURS Performed at [facility]    Report Status PENDING  Incomplete  Urine culture     Status: None (Preliminary result)   Collection Time: 06/08/15  2:04 PM  Result Value Ref Range Status   Specimen Description URINE, CATHETERIZED  Final   Special Requests NONE  Final   Culture   Final    NO GROWTH < 24 HOURS Performed at [facility]    Report Status PENDING  Incomplete     Studies: Ct Head Wo Contrast  06/08/2015  CLINICAL DATA:  Sepsis.  Lethargy and confusion. EXAM: CT HEAD WITHOUT CONTRAST TECHNIQUE: Contiguous axial images were obtained from the base of the skull through the vertex without intravenous contrast. COMPARISON:  01/10/2015 FINDINGS: Mild global atrophy. There are prominent chronic ischemic changes in the periventricular white matter. These findings are stable. No mass effect, midline shift, or acute hemorrhage. Mastoid air cells are clear. Cranium is intact. IMPRESSION: No acute  intracranial pathology. Electronically Signed   By: Marybelle Killings M.D.   On: 06/08/2015 16:16   Mr Brain Wo Contrast  06/08/2015  CLINICAL DATA:  Altered mental status. Right-sided weakness. Lethargy and increased confusion. EXAM: MRI HEAD WITHOUT CONTRAST TECHNIQUE: Multiplanar, multiecho pulse sequences of the brain and surrounding structures were obtained without intravenous contrast. COMPARISON:  CT head without contrast from the same day. MRI brain 10/08/2014 FINDINGS: Extensive periventricular and subcortical T2 changes are stable bilaterally. There is extensive involvement of the  colossoseptal margin. No new lesions are present. There is no restricted diffusion. The ventricles are proportionate to the degree of atrophy. No significant extra-axial fluid collection is present. The brainstem and cerebellum are within normal limits. The internal auditory canals are normal. Flow is present in the major intracranial arteries. The globes and orbits are intact. The paranasal sinuses and mastoid air cells are clear. IMPRESSION: 1. Stable diffuse white matter changes compatible with the given diagnosis of multiple sclerosis. 2. No acute intracranial abnormalities. Electronically Signed   By: San Morelle M.D.   On: 06/08/2015 19:10   Dg Chest Port 1 View  06/08/2015  CLINICAL DATA:  Sepsis, weakness EXAM: PORTABLE CHEST 1 VIEW COMPARISON:  05/30/2015 FINDINGS: Cardiomediastinal silhouette is stable. No acute infiltrate or pleural effusion. No pulmonary edema. Study is limited by poor inspiration. IMPRESSION: No active disease. Electronically Signed   By: Lahoma Crocker M.D.   On: 06/08/2015 15:42    Scheduled Meds: . amantadine  100 mg Oral Daily  . baclofen  20 mg Oral TID  . carvedilol  12.5 mg Oral BID WC  . cefTRIAXone (ROCEPHIN)  IV  1 g Intravenous Q24H  . cholecalciferol  1,000 Units Oral Daily  . darifenacin  15 mg Oral Daily  . docusate sodium  100 mg Oral BID  . DULoxetine  60 mg Oral  Daily  . enoxaparin (LOVENOX) injection  40 mg Subcutaneous QHS  . furosemide  80 mg Oral BID  . gabapentin  900 mg Oral TID  . imipramine  75 mg Oral QPM  . latanoprost  1 drop Both Eyes QHS  . polyethylene glycol  17 g Oral BID  . polyvinyl alcohol  1 drop Both Eyes BID  . potassium chloride SA  20 mEq Oral Daily  . sodium chloride  3 mL Intravenous Q12H  . spironolactone  25 mg Oral Daily  . tamsulosin  0.4 mg Oral Daily   Continuous Infusions: . sodium chloride 50 mL/hr at 06/08/15 2334    Principal Problem:   Multiple sclerosis exacerbation (HCC) Active Problems:   HYPERTENSION, BENIGN ESSENTIAL   UTI (lower urinary tract infection)   Essential hypertension   Weakness   Chronic systolic (congestive) heart failure ([place])    Time spent: > 35 minutes    Velvet Bathe  [facility] Pager [phone] If 7PM-7AM, please contact night-coverage at [email or web address], password [facility] 06/09/2015, 2:51 PM  LOS: 1 day

## 2015-06-10 MED ORDER — LABETALOL HCL 5 MG/ML IV SOLN
5.0000 mg | Freq: Four times a day (QID) | INTRAVENOUS | Status: DC | PRN
Start: 2015-06-10 — End: 2015-06-10

## 2015-06-10 MED ORDER — POTASSIUM CHLORIDE 10 MEQ/100ML IV SOLN
INTRAVENOUS | Status: AC
  Filled 2015-06-10: qty 100

## 2015-06-10 MED ORDER — LABETALOL HCL 5 MG/ML IV SOLN
5.0000 mg | Freq: Four times a day (QID) | INTRAVENOUS | Status: DC | PRN
  Filled 2015-06-10: qty 4

## 2015-06-10 MED ORDER — POTASSIUM CHLORIDE 10 MEQ/100ML IV SOLN
10.0000 meq | INTRAVENOUS | Status: AC
  Administered 2015-06-10 (×3): 10 meq via INTRAVENOUS
  Filled 2015-06-10 (×2): qty 100

## 2015-06-10 NOTE — Progress Notes (Addendum)
[facility] PROGRESS NOTE  Shannon Garrett [id number] DOB: 1960-10-14 DOA: 06/08/2015 PCP: Hennie Duos, MD  Assessment/Plan: Principal Problem:   Multiple sclerosis exacerbation [facility]) - Neurology on board who is currently not recommending any changes in current management. - No further neurodiagnostic studies indicated at this point. - Pt continues to refuse medications and does not interact with examiner appropriately. Will consult psychiatry  Active Problems:   HYPERTENSION, BENIGN ESSENTIAL - Patient currently on carvedilol and Aldactone.    UTI (lower urinary tract infection) - Pt is currently on Rocephin. - will f/u with urine culture - Could be contributing to AMS although mental status does not seem to improve and patient continues to refuse treatment.    Weakness - Secondary to UTI and MS exacerbation    Chronic systolic (congestive) heart failure (HCC) - stable continue current regimen   Code Status: full Family Communication: discussed with patient Disposition Plan: Will consult psychiatry to get evaluation and recommendations as well as disposition recommendations given current medical condition. Addendum: Barrier to discharge: Pt continues with AMS and refusal of medications if new baseline for patient then she may need higher level of care.   Consultants:  Neurology  Procedures:  None  Antibiotics:  Rocephin  HPI/Subjective: Pt has no new complaints. No acute issues reported overnight.  Objective: Filed Vitals:   06/10/15 0601 06/10/15 0621  BP: 175/85 131/88  Pulse: 111 122  Temp: 99 F (37.2 C) 99 F (37.2 C)  Resp: 20 20    Intake/Output Summary (Last 24 hours) at 06/10/15 0941 Last data filed at 06/10/15 0845  Gross per 24 hour  Intake 1811.67 ml  Output      0 ml  Net 1811.67 ml   Filed Weights   06/09/15 1854 06/10/15 0601 06/10/15 0621  Weight: 101.5 kg (223 lb 12.3 oz) 99.3 kg (218 lb 14.7 oz) 58.242 kg (128 lb 6.4  oz)    Exam: Patient refused physical exam. Evaluation visual  General:  Pt in nad, alert and awake  Cardiovascular: No cyanosis  Respiratory: No increased wob, equal chest rise  Abdomen: nd, no guarding  Musculoskeletal: no cyanosis on limited exam.   Data Reviewed: Basic Metabolic Panel:  Recent Labs Lab 06/08/15 1326 06/09/15 0540  NA 145 140  K 3.4* 3.1*  CL 105 105  CO2 31 28  GLUCOSE 98 100*  BUN 11 10  CREATININE 1.02* 0.93  CALCIUM 9.3 9.3   Liver Function Tests:  Recent Labs Lab 06/08/15 1326  AST 33  ALT 99*  ALKPHOS 191*  BILITOT 0.4  PROT 6.9  ALBUMIN 3.9   No results for input(s): LIPASE, AMYLASE in the last 168 hours. No results for input(s): AMMONIA in the last 168 hours. CBC:  Recent Labs Lab 06/08/15 1326 06/09/15 0540  WBC 6.4 7.3  NEUTROABS 2.7  --   HGB 10.0* 9.7*  HCT 30.9* 29.9*  MCV 83.5 82.4  PLT 282 293   Cardiac Enzymes: No results for input(s): CKTOTAL, CKMB, CKMBINDEX, TROPONINI in the last 168 hours. BNP (last 3 results) No results for input(s): BNP in the last 8760 hours.  ProBNP (last 3 results) No results for input(s): PROBNP in the last 8760 hours.  CBG:  Recent Labs Lab 06/08/15 1304  GLUCAP 82    Recent Results (from the past 240 hour(s))  Culture, blood (routine x 2)     Status: None (Preliminary result)   Collection Time: 06/08/15  1:00 PM  Result Value Ref Range  Status   Specimen Description BLOOD LEFT FOREARM  Final   Special Requests BOTTLES DRAWN AEROBIC ONLY 6ML  Final   Culture   Final    NO GROWTH < 24 HOURS Performed at [facility]    Report Status PENDING  Incomplete  Culture, blood (routine x 2)     Status: None (Preliminary result)   Collection Time: 06/08/15  1:55 PM  Result Value Ref Range Status   Specimen Description BLOOD RIGHT ANTECUBITAL  Final   Special Requests BOTTLES DRAWN AEROBIC ONLY 8ML  Final   Culture   Final    NO GROWTH < 24 HOURS Performed at [facility]    Report Status PENDING  Incomplete  Urine culture     Status: None   Collection Time: 06/08/15  2:04 PM  Result Value Ref Range Status   Specimen Description URINE, CATHETERIZED  Final   Special Requests NONE  Final   Culture   Final    NO GROWTH 1 DAY Performed at [facility]    Report Status 06/09/2015 FINAL  Final     Studies: Ct Head Wo Contrast  06/08/2015  CLINICAL DATA:  Sepsis.  Lethargy and confusion. EXAM: CT HEAD WITHOUT CONTRAST TECHNIQUE: Contiguous axial images were obtained from the base of the skull through the vertex without intravenous contrast. COMPARISON:  01/10/2015 FINDINGS: Mild global atrophy. There are prominent chronic ischemic changes in the periventricular white matter. These findings are stable. No mass effect, midline shift, or acute hemorrhage. Mastoid air cells are clear. Cranium is intact. IMPRESSION: No acute intracranial pathology. Electronically Signed   By: Marybelle Killings M.D.   On: 06/08/2015 16:16   Mr Brain Wo Contrast  06/08/2015  CLINICAL DATA:  Altered mental status. Right-sided weakness. Lethargy and increased confusion. EXAM: MRI HEAD WITHOUT CONTRAST TECHNIQUE: Multiplanar, multiecho pulse sequences of the brain and surrounding structures were obtained without intravenous contrast. COMPARISON:  CT head without contrast from the same day. MRI brain 10/08/2014 FINDINGS: Extensive periventricular and subcortical T2 changes are stable bilaterally. There is extensive involvement of the colossoseptal margin. No new lesions are present. There is no restricted diffusion. The ventricles are proportionate to the degree of atrophy. No significant extra-axial fluid collection is present. The brainstem and cerebellum are within normal limits. The internal auditory canals are normal. Flow is present in the major intracranial arteries. The globes and orbits are intact. The paranasal sinuses and mastoid air cells are clear. IMPRESSION: 1. Stable  diffuse white matter changes compatible with the given diagnosis of multiple sclerosis. 2. No acute intracranial abnormalities. Electronically Signed   By: San Morelle M.D.   On: 06/08/2015 19:10   Dg Chest Port 1 View  06/08/2015  CLINICAL DATA:  Sepsis, weakness EXAM: PORTABLE CHEST 1 VIEW COMPARISON:  05/30/2015 FINDINGS: Cardiomediastinal silhouette is stable. No acute infiltrate or pleural effusion. No pulmonary edema. Study is limited by poor inspiration. IMPRESSION: No active disease. Electronically Signed   By: Lahoma Crocker M.D.   On: 06/08/2015 15:42    Scheduled Meds: . amantadine  100 mg Oral Daily  . baclofen  20 mg Oral TID  . carvedilol  12.5 mg Oral BID WC  . cefTRIAXone (ROCEPHIN)  IV  1 g Intravenous Q24H  . cholecalciferol  1,000 Units Oral Daily  . darifenacin  15 mg Oral Daily  . docusate sodium  100 mg Oral BID  . DULoxetine  60 mg Oral Daily  . enoxaparin (LOVENOX) injection  40 mg Subcutaneous QHS  . furosemide  80 mg Oral BID  . gabapentin  900 mg Oral TID  . imipramine  75 mg Oral QPM  . latanoprost  1 drop Both Eyes QHS  . polyethylene glycol  17 g Oral BID  . polyvinyl alcohol  1 drop Both Eyes BID  . potassium chloride SA  20 mEq Oral Daily  . sodium chloride  3 mL Intravenous Q12H  . spironolactone  25 mg Oral Daily  . tamsulosin  0.4 mg Oral Daily   Continuous Infusions: . sodium chloride 50 mL/hr at 06/09/15 1909    Principal Problem:   Multiple sclerosis exacerbation ([place]) Active Problems:   HYPERTENSION, BENIGN ESSENTIAL   UTI (lower urinary tract infection)   Essential hypertension   Weakness   Chronic systolic (congestive) heart failure ([place])    Time spent: > 35 minutes    Velvet Bathe  [facility] Pager [phone] If 7PM-7AM, please contact night-coverage at [email or web address], password [facility] 06/10/2015, 9:41 AM  LOS: 2 days

## 2015-06-10 NOTE — Consult Note (Signed)
[place] Psychiatry Consult   Reason for Consult:  Psychomotor retardation, multiple sclerosis and UTI Referring Physician:  Dr. Wendee Beavers Patient Identification: Shannon Garrett MRN:  [id number] Principal Diagnosis: Multiple sclerosis exacerbation [facility]) Diagnosis:   Patient Active Problem List   Diagnosis Date Noted  . Multiple sclerosis exacerbation ([place]) [G35] 06/08/2015  . Weakness [R53.1] 06/08/2015  . Chronic systolic (congestive) heart failure ([place]) [I50.22] 06/08/2015  . Acute encephalopathy [G93.40] 06/03/2015  . Acute kidney injury ([place]) [N17.9] 06/03/2015  . Hypokalemia [E87.6] 06/03/2015  . Sepsis secondary to UTI ([place]) [A41.9, N39.0] 05/30/2015  . Hypernatremia [E87.0] 05/30/2015  . Dehydration [E86.0] 05/30/2015  . Chronic anemia [D64.9] 05/30/2015  . Chronic pain syndrome [G89.4] 03/03/2015  . Benign hypertensive heart disease without heart failure [I11.9] 01/18/2015  . Urinary tract infection due to Enterococcus [N39.0, B95.2]   . Essential hypertension [I10]   . Abnormal LFTs [R79.89]   . Foot drop, bilateral [M21.371, M21.372]   . Pressure ulcer [L89.90] 01/12/2015  . UTI (lower urinary tract infection) [N39.0] 01/11/2015  . Hypovolemia [E86.1] 01/11/2015  . UTI (urinary tract infection), uncomplicated [M42.6]   . Cardiomyopathy ([place]) [I42.9]   . Urinary incontinence [R32] 01/07/2015  . Chronic constipation [K59.00] 11/26/2014  . Depression [F32.9] 11/26/2014  . Tachycardia [R00.0] 02/20/2014  . Venous stasis ulcers ([place]) [I83.009, L97.909] 02/20/2014  . Leg wound, left [S81.802A] 11/04/2013  . FTT (failure to thrive) in adult [R62.7] 07/31/2013  . Medically noncompliant [Z91.19] 07/31/2013  . Blisters of multiple sites [R23.8] 07/31/2013  . Generalized weakness [R53.1] 06/03/2013  . Insomnia [G47.00] 06/03/2013  . Neurogenic bladder [N31.9] 06/03/2013  . DS (disseminated sclerosis) ([place]) [G35] 02/12/2013  . Chronic diastolic heart failure ([place]) [I50.32]  09/29/2012  . Elevated CK [R74.8] 09/10/2012  . Severe malnutrition ([place]) [E43] 09/05/2012  . Paraplegia ([place]) [G82.20] 09/05/2012  . Fall [W19.XXXA] 09/04/2012  . Chronic combined systolic and diastolic congestive heart failure ([place]) [I50.42] 05/22/2012  . Other primary cardiomyopathies [I42.8] 05/01/2012  . Immobility [Z74.09] 04/28/2012  . OBESITY [E66.9] 11/30/2008  . ANEMIA, CHRONIC [D53.9] 11/30/2008  . MULTIPLE SCLEROSIS, PROGRESSIVE/RELAPSING [G35] 11/30/2008  . HYPERTENSION, BENIGN ESSENTIAL [I10] 11/30/2008  . ECZEMA [L25.9] 11/30/2008    Total Time spent with patient: 30 minutes  Subjective:   Shannon Garrett is a 54 y.o. female patient admitted with increasing lethargy 2 days.  HPI:  Shannon Garrett is a 54 y.o. female seen, chart reviewed for the face-to-face psychiatric consultation and evaluation of decreased psychomotor activity and increased lethargy for the last 2 days and has a limited communication with the providers in the hospital. Patient is a resident [facility] and has been suffering with a history of Multiple Sclerosis , Systolic CHF, HTN, Glaucoma. Patient has no family members or guardians at bedside. Patient seen sitting in her bed with head end was elevated and has a dry lips. Patient will open her eyes and try to communicate verbally but no worse came out except some sounds which has no specific meanings. Reportedly patient has no acute neurologic event as per the CT scan and the MRI scan of the brain done in the emergency department. In the ED she has urinalysis was positive, however she has been on Pyridium and Cipro Rx so a urine culture was sent and she was placed on empiric IV Rocephin and referred for admission. Neurology was consulted who ruled out exacerbation of the multiple sclerosis and recommended treatment for the UTI. Patient appeared to be doing dehydrated  may needed supportive therapy. Will ask the unit social worker to  contact facility for collateral information regarding safety issues. Patient not able to communicate current safety issues and does not appear to be responding to internal stimuli. Patient has no known irritability, agitation or aggressive behavior.  Past Psychiatric History: Patient has been suffering with multiple sclerosis, depression but does not have history of psychiatric hospitalization.  Risk to Self: Is patient at risk for suicide?: No Risk to Others:   Prior Inpatient Therapy:   Prior Outpatient Therapy:    Past Medical History:  Past Medical History  Diagnosis Date  . MS (multiple sclerosis) ([place])     a. Dx'd late 20's. b. Tx with Novantrone, Tysabri, Copaxone previously.  Marland Kitchen Hypertension   . Depression   . Glaucoma   . Cardiomyopathy ([place])     a.  Echo 04/29/12: Mild LVH, EF 20-25%, mild AI, moderate MR, moderate LAE, mild RAE, mild RVE, moderate TR, PASP 51, small pericardial effusion;   b. probably non-ischemic given multiple chemo-Tx agents used for MS and global LV dysfn on echo  . Chronic systolic heart failure [facility])     Past Surgical History  Procedure Laterality Date  . Ablation      uterine  . Cesarean section     Family History:  Family History  Problem Relation Age of Onset  . Hypertension Mother   . Heart attack Neg Hx   . Cancer Mother     breast   . Cancer Father     prostate  . Multiple sclerosis Sister    Family Psychiatric History: unknown Social History:  History  Alcohol Use No     History  Drug Use No    Social History   Social History  . Marital Status: Single    Spouse Name: N/A  . Number of Children: N/A  . Years of Education: N/A   Social History Main Topics  . Smoking status: Former Research scientist (life sciences)  . Smokeless tobacco: Never Used  . Alcohol Use: No  . Drug Use: No  . Sexual Activity: Not Currently   Other Topics Concern  . None   Social History Narrative   Additional Social History:                           Allergies:   Allergies  Allergen Reactions  . Sulfonamide Derivatives Hives and Shortness Of Breath  . Penicillins Hives and Itching    Has patient had a PCN reaction causing immediate rash, facial/tongue/throat swelling, SOB or lightheadedness with hypotension: unknown Has patient had a PCN reaction causing severe rash involving mucus membranes or skin necrosis: unknown Has patient had a PCN reaction that required hospitalization: unknown Has patient had a PCN reaction occurring within the last 10 years: unknown If all of the above answers are "NO", then may proceed with Cephalosporin use. Prior course of rocephin charted 05/2015    Labs:  Results for orders placed or performed during the hospital encounter of 06/08/15 (from the past 48 hour(s))  Culture, blood (routine x 2)     Status: None (Preliminary result)   Collection Time: 06/08/15  1:00 PM  Result Value Ref Range   Specimen Description BLOOD LEFT FOREARM    Special Requests BOTTLES DRAWN AEROBIC ONLY 6ML    Culture      NO GROWTH < 24 HOURS Performed at [facility]    Report Status PENDING   CBG monitoring,  ED     Status: None   Collection Time: 06/08/15  1:04 PM  Result Value Ref Range   Glucose-Capillary 82 65 - 99 mg/dL   Comment 1 Notify RN   Comprehensive metabolic panel     Status: Abnormal   Collection Time: 06/08/15  1:26 PM  Result Value Ref Range   Sodium 145 135 - 145 mmol/L   Potassium 3.4 (L) 3.5 - 5.1 mmol/L   Chloride 105 101 - 111 mmol/L   CO2 31 22 - 32 mmol/L   Glucose, Bld 98 65 - 99 mg/dL   BUN 11 6 - 20 mg/dL   Creatinine, Ser 1.02 (H) 0.44 - 1.00 mg/dL   Calcium 9.3 8.9 - 10.3 mg/dL   Total Protein 6.9 6.5 - 8.1 g/dL   Albumin 3.9 3.5 - 5.0 g/dL   AST 33 15 - 41 U/L   ALT 99 (H) 14 - 54 U/L   Alkaline Phosphatase 191 (H) 38 - 126 U/L   Total Bilirubin 0.4 0.3 - 1.2 mg/dL   GFR calc non Af Amer >60 >60 mL/min   GFR calc Af Amer >60 >60 mL/min    Comment: (NOTE) The eGFR has  been calculated using the CKD EPI equation. This calculation has not been validated in all clinical situations. eGFR's persistently <60 mL/min signify possible Chronic Kidney Disease.    Anion gap 9 5 - 15  CBC with Differential     Status: Abnormal   Collection Time: 06/08/15  1:26 PM  Result Value Ref Range   WBC 6.4 4.0 - 10.5 K/uL   RBC 3.70 (L) 3.87 - 5.11 MIL/uL   Hemoglobin 10.0 (L) 12.0 - 15.0 g/dL   HCT 30.9 (L) 36.0 - 46.0 %   MCV 83.5 78.0 - 100.0 fL   MCH 27.0 26.0 - 34.0 pg   MCHC 32.4 30.0 - 36.0 g/dL   RDW 16.2 (H) 11.5 - 15.5 %   Platelets 282 150 - 400 K/uL   Neutrophils Relative % 42 %   Neutro Abs 2.7 1.7 - 7.7 K/uL   Lymphocytes Relative 40 %   Lymphs Abs 2.5 0.7 - 4.0 K/uL   Monocytes Relative 9 %   Monocytes Absolute 0.6 0.1 - 1.0 K/uL   Eosinophils Relative 8 %   Eosinophils Absolute 0.5 0.0 - 0.7 K/uL   Basophils Relative 1 %   Basophils Absolute 0.1 0.0 - 0.1 K/uL  Culture, blood (routine x 2)     Status: None (Preliminary result)   Collection Time: 06/08/15  1:55 PM  Result Value Ref Range   Specimen Description BLOOD RIGHT ANTECUBITAL    Special Requests BOTTLES DRAWN AEROBIC ONLY 8ML    Culture      NO GROWTH < 24 HOURS Performed at [facility]    Report Status PENDING   I-Stat CG4 Lactic Acid, ED (Not at [facility])     Status: None   Collection Time: 06/08/15  2:03 PM  Result Value Ref Range   Lactic Acid, Venous 0.72 0.5 - 2.0 mmol/L  Urinalysis, Routine w reflex microscopic (not at [facility])     Status: Abnormal   Collection Time: 06/08/15  2:04 PM  Result Value Ref Range   Color, Urine ORANGE (A) YELLOW    Comment: BIOCHEMICALS MAY BE AFFECTED BY COLOR   APPearance CLOUDY (A) CLEAR   Specific Gravity, Urine 1.015 1.005 - 1.030   pH 5.5 5.0 - 8.0   Glucose, UA NEGATIVE NEGATIVE mg/dL  Hgb urine dipstick NEGATIVE NEGATIVE   Bilirubin Urine NEGATIVE NEGATIVE   Ketones, ur NEGATIVE NEGATIVE mg/dL   Protein, ur NEGATIVE NEGATIVE mg/dL    Nitrite POSITIVE (A) NEGATIVE   Leukocytes, UA TRACE (A) NEGATIVE  Urine culture     Status: None   Collection Time: 06/08/15  2:04 PM  Result Value Ref Range   Specimen Description URINE, CATHETERIZED    Special Requests NONE    Culture      NO GROWTH 1 DAY Performed at [facility]    Report Status 06/09/2015 FINAL   Urine microscopic-add on     Status: Abnormal   Collection Time: 06/08/15  2:04 PM  Result Value Ref Range   Squamous Epithelial / LPF 6-30 (A) NONE SEEN   WBC, UA 0-5 0 - 5 WBC/hpf   RBC / HPF 0-5 0 - 5 RBC/hpf   Bacteria, UA RARE (A) NONE SEEN   Casts HYALINE CASTS (A) NEGATIVE   Urine-Other MUCOUS PRESENT   I-Stat CG4 Lactic Acid, ED (Not at [facility])     Status: None   Collection Time: 06/08/15  4:42 PM  Result Value Ref Range   Lactic Acid, Venous 0.56 0.5 - 2.0 mmol/L  Basic metabolic panel     Status: Abnormal   Collection Time: 06/09/15  5:40 AM  Result Value Ref Range   Sodium 140 135 - 145 mmol/L   Potassium 3.1 (L) 3.5 - 5.1 mmol/L   Chloride 105 101 - 111 mmol/L   CO2 28 22 - 32 mmol/L   Glucose, Bld 100 (H) 65 - 99 mg/dL   BUN 10 6 - 20 mg/dL   Creatinine, Ser 0.93 0.44 - 1.00 mg/dL   Calcium 9.3 8.9 - 10.3 mg/dL   GFR calc non Af Amer >60 >60 mL/min   GFR calc Af Amer >60 >60 mL/min    Comment: (NOTE) The eGFR has been calculated using the CKD EPI equation. This calculation has not been validated in all clinical situations. eGFR's persistently <60 mL/min signify possible Chronic Kidney Disease.    Anion gap 7 5 - 15  CBC     Status: Abnormal   Collection Time: 06/09/15  5:40 AM  Result Value Ref Range   WBC 7.3 4.0 - 10.5 K/uL   RBC 3.63 (L) 3.87 - 5.11 MIL/uL   Hemoglobin 9.7 (L) 12.0 - 15.0 g/dL   HCT 29.9 (L) 36.0 - 46.0 %   MCV 82.4 78.0 - 100.0 fL   MCH 26.7 26.0 - 34.0 pg   MCHC 32.4 30.0 - 36.0 g/dL   RDW 15.9 (H) 11.5 - 15.5 %   Platelets 293 150 - 400 K/uL    Current Facility-Administered Medications  Medication  Dose Route Frequency Provider Last Rate Last Dose  . 0.9 %  sodium chloride infusion   Intravenous Continuous Theressa Millard, MD 50 mL/hr at 06/09/15 1909    . acetaminophen (TYLENOL) tablet 650 mg  650 mg Oral Q6H PRN Theressa Millard, MD       Or  . acetaminophen (TYLENOL) suppository 650 mg  650 mg Rectal Q6H PRN Theressa Millard, MD      . alum & mag hydroxide-simeth (MAALOX/MYLANTA) 200-200-20 MG/5ML suspension 30 mL  30 mL Oral Q6H PRN Theressa Millard, MD      . amantadine (SYMMETREL) capsule 100 mg  100 mg Oral Daily Theressa Millard, MD   100 mg at 06/09/15 1136  . baclofen (LIORESAL) tablet 20  mg  20 mg Oral TID Theressa Millard, MD   20 mg at 06/09/15 1656  . carvedilol (COREG) tablet 12.5 mg  12.5 mg Oral BID WC Theressa Millard, MD   12.5 mg at 06/09/15 2706  . cefTRIAXone (ROCEPHIN) 1 g in dextrose 5 % 50 mL IVPB  1 g Intravenous Q24H Theressa Millard, MD   1 g at 06/09/15 1609  . cholecalciferol (VITAMIN D) tablet 1,000 Units  1,000 Units Oral Daily Theressa Millard, MD   1,000 Units at 06/09/15 1128  . darifenacin (ENABLEX) 24 hr tablet 15 mg  15 mg Oral Daily Theressa Millard, MD   15 mg at 06/09/15 1121  . docusate sodium (COLACE) capsule 100 mg  100 mg Oral BID Theressa Millard, MD   100 mg at 06/09/15 1126  . DULoxetine (CYMBALTA) DR capsule 60 mg  60 mg Oral Daily Theressa Millard, MD   60 mg at 06/09/15 1123  . enoxaparin (LOVENOX) injection 40 mg  40 mg Subcutaneous QHS Theressa Millard, MD   40 mg at 06/09/15 2317  . furosemide (LASIX) tablet 80 mg  80 mg Oral BID Theressa Millard, MD   80 mg at 06/09/15 0824  . gabapentin (NEURONTIN) capsule 900 mg  900 mg Oral TID Theressa Millard, MD   600 mg at 06/09/15 1656  . hydrALAZINE (APRESOLINE) injection 10 mg  10 mg Intravenous Q6H PRN Theressa Millard, MD      . HYDROmorphone (DILAUDID) injection 0.5-1 mg  0.5-1 mg Intravenous Q3H PRN Theressa Millard, MD   1 mg at 06/09/15 2250  .  imipramine (TOFRANIL) tablet 75 mg  75 mg Oral QPM Theressa Millard, MD   75 mg at 06/08/15 2336  . latanoprost (XALATAN) 0.005 % ophthalmic solution 1 drop  1 drop Both Eyes QHS Theressa Millard, MD   1 drop at 06/09/15 2318  . ondansetron (ZOFRAN) tablet 4 mg  4 mg Oral Q6H PRN Theressa Millard, MD       Or  . ondansetron (ZOFRAN) injection 4 mg  4 mg Intravenous Q6H PRN Theressa Millard, MD      . oxyCODONE (Oxy IR/ROXICODONE) immediate release tablet 5 mg  5 mg Oral Q4H PRN Theressa Millard, MD      . polyethylene glycol (MIRALAX / GLYCOLAX) packet 17 g  17 g Oral BID Theressa Millard, MD   17 g at 06/10/15 1034  . polyvinyl alcohol (LIQUIFILM TEARS) 1.4 % ophthalmic solution 1 drop  1 drop Both Eyes BID Theressa Millard, MD   1 drop at 06/09/15 2310  . potassium chloride 10 mEq in 100 mL IVPB  10 mEq Intravenous Q1 Hr x 3 Velvet Bathe, MD   10 mEq at 06/10/15 1035  . potassium chloride SA (K-DUR,KLOR-CON) CR tablet 20 mEq  20 mEq Oral Daily Theressa Millard, MD   20 mEq at 06/09/15 1128  . sodium chloride 0.9 % injection 3 mL  3 mL Intravenous Q12H Theressa Millard, MD   3 mL at 06/08/15 2337  . spironolactone (ALDACTONE) tablet 25 mg  25 mg Oral Daily Theressa Millard, MD   25 mg at 06/09/15 1125  . tamsulosin (FLOMAX) capsule 0.4 mg  0.4 mg Oral Daily Theressa Millard, MD   0.4 mg at 06/09/15 1124    Musculoskeletal: Strength & Muscle Tone: decreased Gait & Station: unable to stand Patient leans: N/A  Psychiatric Specialty Exam: ROS unable to obtain secondary to extremely poor communication skills   Blood pressure 131/88, pulse 122, temperature 99 F (37.2 C), temperature source Oral, resp. rate 20, height _0  (1.651 m), weight 58.242 kg (128 lb 6.4 oz), SpO2 98 %.Body mass index is 21.37 kg/(m^2).  General Appearance: Casual and Guarded  Eye Contact::  Fair  Speech:  Blocked and Slurred  Volume:  Decreased  Mood:  Depressed  Affect:  Depressed and Flat   Thought Process:  Loose  Orientation:  NA  Thought Content:  NA  Suicidal Thoughts:  No  Homicidal Thoughts:  No  Memory:  Immediate;   Poor Recent;   Poor  Judgement:  Poor  Insight:  Lacking  Psychomotor Activity:  Decreased  Concentration:  Poor  Recall:  Poor  Fund of Knowledge:NA  Language: Poor  Akathisia:  Negative  Handed:  Right  AIMS (if indicated):     Assets:  Others:  deffered  ADL's:  Impaired  Cognition: Impaired,  Severe  Sleep:      Treatment Plan Summary: Daily contact with patient to assess and evaluate symptoms and progress in treatment and Medication management  Review of neurological consultation indicated patient does not have exacerbated multiple sclerosis and patient does not appear to be responding to internal stimuli. We believe the recent increase psychomotor retardation probably secondary to urinary tract infection or excessive medication.  Refer to the unit social worker to contact facility regarding safety concerns and additional psychosocial information Recommended continue her current medication regimen without any changes Patient does not appear to be required psychiatric services at this time and Psychiatric consultation will sign off at this time  Disposition: Patient does not meet criteria for psychiatric inpatient admission. Supportive therapy provided about ongoing stressors.  Trudi Morgenthaler,JANARDHAHA R. 06/10/2015 10:50 AM

## 2015-06-10 NOTE — Progress Notes (Signed)
Patient refusing medications and food.  MD made aware.  Will continue to monitor closely.

## 2015-06-10 NOTE — Progress Notes (Addendum)
Patient ate some plastic/paper from her IV tubing.  Will not spit out.  Patient growling/attempting to bite, etc.  MD made aware.  Will continue to monitor closely.

## 2015-06-10 NOTE — Clinical Social Work Note (Signed)
Clinical Social Work Assessment  Patient Details  Name: Shannon Garrett MRN: [id number] Date of Birth: Feb 17, 1961  Date of referral:  06/09/15               Reason for consult:  Facility Placement, Discharge Planning                Permission sought to share information with:    Permission granted to share information::  No (Pt disoriented)  Name::     sister Shannon Garrett  Agency::     Relationship::     Contact Information:     Housing/Transportation Living arrangements for the past 2 months:  [place] of Information:  Other (Comment Required) (Sister) Patient Interpreter Needed:  None Criminal Activity/Legal Involvement Pertinent to Current Situation/Hospitalization:  No - Comment as needed Significant Relationships:  Adult Children, Siblings Lives with:  Facility Resident Do you feel safe going back to the place where you live?  No Need for family participation in patient care:  Yes (Comment)  Care giving concerns:  Pt and sister have not been pleased with the care at [facility].   Social Worker assessment / plan:  Pt is currently disoriented and unable to complete assessment.  CSW spoke to pt's sister Shannon Garrett.  Shannon Garrett explained that pt has been a resident at [facility] for close to a year.  Pt was hospitalized last week and discharged back to the facility although they would prefer a different facility.  Pt has an 52 y/o daughter who lives in [place].  Pt does not have a HCPOA.    Sister stated she talked to staff at [facility] in [facility] last week who stated they had available Medicaid beds.  CSW explained that she would contact the facility to see if they still have medicaid beds available.   Employment status:  Disabled (Comment on whether or not currently receiving Disability) Insurance information:  Medicaid [place], Managed Care PT Recommendations:  Not assessed at this time Information / Referral to community resources:  [place]  Patient/Family's Response to care:  Sister was Patent attorney of CSW assistance  Patient/Family's Understanding of and Emotional Response to Diagnosis, Current Treatment, and Prognosis:  CSW explained that she will contact the facility and keep sister informed.  Sister is hopeful that pt will be able to move to a new facility but understands that pt may have to return to [facility] if she does not receive additional offers.  Emotional Assessment Appearance:  Appears older than stated age Attitude/Demeanor/Rapport:  Unable to Assess (Disoriented) Affect (typically observed):  Stable Orientation:  Oriented to Self Alcohol / Substance use:  Not Applicable Psych involvement (Current and /or in the community):  No (Comment)  Discharge Needs  Concerns to be addressed:  Discharge Planning Concerns Readmission within the last 30 days:  Yes Current discharge risk:  None Barriers to Discharge:  Continued Medical Work up   [place], Troy D, [place] 06/10/2015, 9:49 AM

## 2015-06-10 NOTE — Progress Notes (Addendum)
Patient refusing to let staff preform any care.  Still refusing medications, food and drink.  Patient attempting to bite and hit.  Patient chewing on telemetry wires and pulling them off.  For patient safety I removed telemetry. Paged MD and made aware, no new orders.

## 2015-06-10 NOTE — Progress Notes (Signed)
Patient sister arrived to visit patient.  Once sister arrived patient started following commands, eating, drinking and taking her medications.  Patient appears much more at ease when sister is around.

## 2015-06-11 MED ORDER — FUROSEMIDE 80 MG PO TABS: 80.0000 mg | ORAL_TABLET | Freq: Two times a day (BID) | ORAL | Status: DC

## 2015-06-11 MED ORDER — CEFDINIR 300 MG PO CAPS
300.0000 mg | ORAL_CAPSULE | Freq: Two times a day (BID) | ORAL | Status: DC
Start: 2015-06-11 — End: 2015-07-04

## 2015-06-11 NOTE — Evaluation (Signed)
Physical Therapy Evaluation Patient Details Name: Shannon Garrett MRN: [id number] DOB: [birth year] Today's Date: 06/11/2015   History of Present Illness  Shannon Garrett is a 54 y.o. female with a past medical history of advanced multiple sclerosis, hypertension, depression, glaucoma, systolic CHF, chronic pain syndrome who is brought to the emergency department via EMS from [place] due to altered mental status and lethargy x  2 days. At baseline, the patient is usually nonambulatory due to lower extremity weakness, but awake alert oriented 3 and able to talk and interact with the facilities personnel and relatives. Per patient's sister, who was at bedside earlier, she states that the patient has the symptoms when she gets and UTI. Workup in the emergency department is consistent with sepsis due to UTI.  Clinical Impression  Pt admitted with above diagnosis. Pt currently with functional limitations due to the deficits listed below (see PT Problem List).  Pt will benefit from skilled PT to increase their independence and safety with mobility to allow discharge to the venue listed below.  Limited evaluation today with rolling at MAX to TOT A of 1 person.  Recommend +2 with attempting EOB.  At this time recommend mechanical lift for OOB activities. Pt was using stand up loft at SNF, but do not she could use this one at this time and pt is in agreement. Recommend returning to SNF with PT to work towards returning to [facility].     Follow Up Recommendations SNF    Equipment Recommendations  None recommended by PT    Recommendations for Other Services       Precautions / Restrictions Precautions Precautions: Fall      Mobility  Bed Mobility Overal bed mobility: Needs Assistance Bed Mobility: Rolling Rolling: Max assist;Total assist         General bed mobility comments: Pt attempting to A with upper body, but unable to help with hips/lower body.    Transfers                     Ambulation/Gait                Stairs            Wheelchair Mobility    Modified Rankin (Stroke Patients Only)       Balance                                             Pertinent Vitals/Pain Pain Assessment: Faces Faces Pain Scale: Hurts little more Pain Location: R trunk Pain Intervention(s): Repositioned    Home Living Family/patient expects to be discharged to:: Skilled nursing facility                      Prior Function Level of Independence: Needs assistance   Gait / Transfers Assistance Needed: Using stand up lift with 2 person assist for transfers into w/c.  Unable to propel w/c. States she can sit EOB without support.     Comments: Pt reports her w/c has lateral supports on R side due to trunk weakness.     Hand Dominance        Extremity/Trunk Assessment   Upper Extremity Assessment:  (not formally assessed, but limited movement with all joints)           Lower Extremity Assessment: RLE deficits/detail;LLE deficits/detail RLE Deficits /  Details: trace muscle movement, but not functional LLE Deficits / Details: trace muscle movement, but not functional     Communication   Communication: No difficulties  Cognition Arousal/Alertness: Awake/alert Behavior During Therapy: Flat affect Overall Cognitive Status: No family/caregiver present to determine baseline cognitive functioning                      General Comments General comments (skin integrity, edema, etc.): Deferred today due to weakness and fatigue, but pt states R side is her weak side and has lateral trunk supports on w/c.    Exercises General Exercises - Lower Extremity Ankle Circles/Pumps: PROM;Both;10 reps Heel Slides: PROM;Both;5 reps      Assessment/Plan    PT Assessment Patient needs continued PT services  PT Diagnosis Generalized weakness;Quadraplegia   PT Problem List Decreased mobility;Decreased strength;Decreased  balance  PT Treatment Interventions Functional mobility training;Therapeutic activities;Therapeutic exercise;Balance training   PT Goals (Current goals can be found in the Care Plan section) Acute Rehab PT Goals Patient Stated Goal: To be able to use stand up lift again PT Goal Formulation: With patient Time For Goal Achievement: 06/18/15 Potential to Achieve Goals: Fair    Frequency Min 2X/week   Barriers to discharge        Co-evaluation               End of Session   Activity Tolerance: Patient limited by fatigue Patient left: in bed;with call bell/phone within reach;with bed alarm set Nurse Communication: Need for lift equipment         Time: 1343-1400 PT Time Calculation (min) (ACUTE ONLY): 17 min   Charges:   PT Evaluation $Initial PT Evaluation Tier I: 1 Procedure     PT G Codes:        Lynee Rosenbach LUBECK 06/11/2015, 2:13 PM

## 2015-06-11 NOTE — Discharge Summary (Addendum)
Physician Discharge Summary  Shannon Garrett [id number] DOB: [birth year] DOA: 06/08/2015  PCP: Hennie Duos, MD  Admit date: 06/08/2015 Discharge date: 06/12/2015  Time spent: > 35 minutes  Recommendations for Outpatient Follow-up:  1. Monitor K levels 2. Pt's mental status improved with Rocephin. Will be discharged on omnicef for 6 more days to complete a 10 day treatment course.   Discharge Diagnoses:  Principal Problem:   Multiple sclerosis exacerbation ([place]) Active Problems:   HYPERTENSION, BENIGN ESSENTIAL   UTI (lower urinary tract infection)   Essential hypertension   Weakness   Chronic systolic (congestive) heart failure ([place])   Discharge Condition: stable  Diet recommendation: Heart healthy  Filed Weights   06/10/15 0621 06/11/15 0538 06/12/15 0500  Weight: 58.242 kg (128 lb 6.4 oz) 98.9 kg (218 lb 0.6 oz) 99.9 kg (220 lb 3.8 oz)    History of present illness:  Based on initial H and P: 54 y.o. female with a history of Multiple Sclerosis , Systolic CHf, HTN, Glaucoma who was sent for the [facility] to the ED due to increasing Lethargy over the past 2 days. In the ED she was evaluated and sent for a CT scan and an MRI which were negative for acute findings, and a urinalysis was positive, however she has been on Pyridium and Cipro Rx so a urine culture was sent and she was placed on empiric IV Rocephin and referred for admission. Neurology was consulted.   Hospital Course:  Principal Problem:  Multiple sclerosis exacerbation [facility]) - Neurology on board who is currently not recommending any changes in current management. - No further neurodiagnostic studies indicated at this point. - AMS improving with antibiotic therapy  Active Problems:  HYPERTENSION, BENIGN ESSENTIAL - Patient currently on carvedilol and Aldactone.   UTI (lower urinary tract infection) - Pt improved on Rocephin. Will d/c on omnicef - urine culture reporting no growth. May  have been because patient was on cipro prior to collecting.   Weakness - Secondary to UTI and MS exacerbation - Recommend continue PT at SNF   Chronic systolic (congestive) heart failure (H[facility]) - stable continue current regimen   Procedures:  None  Consultations:  Psychiatry  Neurology  Discharge Exam: Filed Vitals:   06/11/15 2208 06/12/15 0409  BP: 102/59 100/56  Pulse: 87 91  Temp: 98.5 F (36.9 C) 98.6 F (37 C)  Resp: 20 20    General: Pt in nad, alert and awake Cardiovascular: rrr, no mrg Respiratory: cta bl, no wheezes  Discharge Instructions   Discharge Instructions    Call MD for:  severe uncontrolled pain    Complete by:  As directed      Call MD for:  temperature >100.4    Complete by:  As directed      Diet - low sodium heart healthy    Complete by:  As directed      Discharge instructions    Complete by:  As directed   Please be sure to follow up with your primary care physician at the facility. You will need to have your potassium levels rechecked.     Increase activity slowly    Complete by:  As directed           Current Discharge Medication List    START taking these medications   Details  cefdinir (OMNICEF) 300 MG capsule Take 1 capsule (300 mg total) by mouth 2 (two) times daily. Qty: 12 capsule, Refills: 0  CONTINUE these medications which have CHANGED   Details  furosemide (LASIX) 80 MG tablet Take 1 tablet (80 mg total) by mouth 2 (two) times daily. Qty: 60 tablet, Refills: 0      CONTINUE these medications which have NOT CHANGED   Details  amantadine (SYMMETREL) 100 MG capsule Take 100 mg by mouth daily.     baclofen (LIORESAL) 20 MG tablet Take 20 mg by mouth 3 (three) times daily.    carvedilol (COREG) 12.5 MG tablet Take 1 tablet (12.5 mg total) by mouth 2 (two) times daily with a meal.    Cholecalciferol (VITAMIN D-3 PO) Take 1 tablet by mouth daily.    CRANBERRY PO Take 100 mg by mouth daily.     Cyanocobalamin (VITAMIN B-12 PO) Take 1 tablet by mouth daily.    docusate sodium (COLACE) 100 MG capsule Take 100 mg by mouth 2 (two) times daily.    DULoxetine (CYMBALTA) 60 MG capsule Take 60 mg by mouth daily.    gabapentin (NEURONTIN) 300 MG capsule Take 3 capsules (900 mg total) by mouth 3 (three) times daily. Qty: 270 capsule, Refills: 5    imipramine (TOFRANIL) 50 MG tablet Take 1.5 tablets (75 mg total) by mouth every evening. For depressive disorder Qty: 90 tablet, Refills: 11   Associated Diagnoses: Chronic pain    latanoprost (XALATAN) 0.005 % ophthalmic solution Place 1 drop into both eyes at bedtime.    magnesium hydroxide (MILK OF MAGNESIA) 400 MG/5ML suspension Take 30 mLs by mouth every 8 (eight) hours as needed for mild constipation or moderate constipation.    natalizumab (TYSABRI) 300 MG/15ML injection Inject 300 mg into the vein every 30 (thirty) days.    polyethylene glycol (MIRALAX / GLYCOLAX) packet Take 17 g by mouth 2 (two) times daily. Qty: 120 packet, Refills: 11   Associated Diagnoses: Chronic constipation    potassium chloride (KLOR-CON) 20 MEQ packet Take 20 mEq by mouth daily.    Propylene Glycol (SYSTANE BALANCE) 0.6 % SOLN Place 1 drop into both eyes 2 (two) times daily.    solifenacin (VESICARE) 10 MG tablet Take 10 mg by mouth daily. For bladder spasms    spironolactone (ALDACTONE) 25 MG tablet Take 25 mg by mouth daily. For CHF    tamsulosin (FLOMAX) 0.4 MG CAPS capsule Take 0.4 mg by mouth daily. For incontinence      STOP taking these medications     ciprofloxacin (CIPRO) 500 MG tablet      Fluocinolone Acetonide (DERMA-SMOOTHE/FS SCALP) 0.01 % OIL      oxyCODONE-acetaminophen (PERCOCET) 7.5-325 MG tablet      phenazopyridine (PYRIDIUM) 100 MG tablet      zolpidem (AMBIEN) 10 MG tablet        Allergies  Allergen Reactions  . Sulfonamide Derivatives Hives and Shortness Of Breath  . Penicillins Hives and Itching    Has patient  had a PCN reaction causing immediate rash, facial/tongue/throat swelling, SOB or lightheadedness with hypotension: unknown Has patient had a PCN reaction causing severe rash involving mucus membranes or skin necrosis: unknown Has patient had a PCN reaction that required hospitalization: unknown Has patient had a PCN reaction occurring within the last 10 years: unknown If all of the above answers are "NO", then may proceed with Cephalosporin use. Prior course of rocephin charted 05/2015   Follow-up Information    Follow up with [facility] .   Specialty:  Skilled Nursing Facility   Contact information:  [address] [place] [phone]       The results of significant diagnostics from this hospitalization (including imaging, microbiology, ancillary and laboratory) are listed below for reference.    Significant Diagnostic Studies: Ct Head Wo Contrast  06/08/2015  CLINICAL DATA:  Sepsis.  Lethargy and confusion. EXAM: CT HEAD WITHOUT CONTRAST TECHNIQUE: Contiguous axial images were obtained from the base of the skull through the vertex without intravenous contrast. COMPARISON:  01/10/2015 FINDINGS: Mild global atrophy. There are prominent chronic ischemic changes in the periventricular white matter. These findings are stable. No mass effect, midline shift, or acute hemorrhage. Mastoid air cells are clear. Cranium is intact. IMPRESSION: No acute intracranial pathology. Electronically Signed   By: Marybelle Killings M.D.   On: 06/08/2015 16:16   Mr Brain Wo Contrast  06/08/2015  CLINICAL DATA:  Altered mental status. Right-sided weakness. Lethargy and increased confusion. EXAM: MRI HEAD WITHOUT CONTRAST TECHNIQUE: Multiplanar, multiecho pulse sequences of the brain and surrounding structures were obtained without intravenous contrast. COMPARISON:  CT head without contrast from the same day. MRI brain 10/08/2014 FINDINGS: Extensive  periventricular and subcortical T2 changes are stable bilaterally. There is extensive involvement of the colossoseptal margin. No new lesions are present. There is no restricted diffusion. The ventricles are proportionate to the degree of atrophy. No significant extra-axial fluid collection is present. The brainstem and cerebellum are within normal limits. The internal auditory canals are normal. Flow is present in the major intracranial arteries. The globes and orbits are intact. The paranasal sinuses and mastoid air cells are clear. IMPRESSION: 1. Stable diffuse white matter changes compatible with the given diagnosis of multiple sclerosis. 2. No acute intracranial abnormalities. Electronically Signed   By: San Morelle M.D.   On: 06/08/2015 19:10   Dg Chest Port 1 View  06/08/2015  CLINICAL DATA:  Sepsis, weakness EXAM: PORTABLE CHEST 1 VIEW COMPARISON:  05/30/2015 FINDINGS: Cardiomediastinal silhouette is stable. No acute infiltrate or pleural effusion. No pulmonary edema. Study is limited by poor inspiration. IMPRESSION: No active disease. Electronically Signed   By: Lahoma Crocker M.D.   On: 06/08/2015 15:42   Dg Chest Port 1 View  05/30/2015  CLINICAL DATA:  Lethargy and altered mental status, 2 days duration. Febrile. EXAM: PORTABLE CHEST 1 VIEW COMPARISON:  01/10/2015 FINDINGS: A single AP portable view of the chest demonstrates no focal airspace consolidation or alveolar edema. The lungs are grossly clear. There is no large effusion or pneumothorax. Cardiac and mediastinal contours appear unremarkable. IMPRESSION: No active disease. Electronically Signed   By: Andreas Newport M.D.   On: 05/30/2015 21:35    Microbiology: Recent Results (from the past 240 hour(s))  Culture, blood (routine x 2)     Status: None (Preliminary result)   Collection Time: 06/08/15  1:00 PM  Result Value Ref Range Status   Specimen Description BLOOD LEFT FOREARM  Final   Special Requests BOTTLES DRAWN AEROBIC  ONLY 6ML  Final   Culture   Final    NO GROWTH 4 DAYS Performed at [facility]    Report Status PENDING  Incomplete  Culture, blood (routine x 2)     Status: None (Preliminary result)   Collection Time: 06/08/15  1:55 PM  Result Value Ref Range Status   Specimen Description BLOOD RIGHT ANTECUBITAL  Final   Special Requests BOTTLES DRAWN AEROBIC ONLY 8ML  Final   Culture   Final    NO GROWTH 4 DAYS Performed at [facility]    Report Status PENDING  Incomplete  Urine culture     Status: None   Collection Time: 06/08/15  2:04 PM  Result Value Ref Range Status   Specimen Description URINE, CATHETERIZED  Final   Special Requests NONE  Final   Culture   Final    NO GROWTH 1 DAY Performed at [facility]    Report Status 06/09/2015 FINAL  Final     Labs: Basic Metabolic Panel:  Recent Labs Lab 06/08/15 1326 06/09/15 0540  NA 145 140  K 3.4* 3.1*  CL 105 105  CO2 31 28  GLUCOSE 98 100*  BUN 11 10  CREATININE 1.02* 0.93  CALCIUM 9.3 9.3   Liver Function Tests:  Recent Labs Lab 06/08/15 1326  AST 33  ALT 99*  ALKPHOS 191*  BILITOT 0.4  PROT 6.9  ALBUMIN 3.9   No results for input(s): LIPASE, AMYLASE in the last 168 hours. No results for input(s): AMMONIA in the last 168 hours. CBC:  Recent Labs Lab 06/08/15 1326 06/09/15 0540  WBC 6.4 7.3  NEUTROABS 2.7  --   HGB 10.0* 9.7*  HCT 30.9* 29.9*  MCV 83.5 82.4  PLT 282 293   Cardiac Enzymes: No results for input(s): CKTOTAL, CKMB, CKMBINDEX, TROPONINI in the last 168 hours. BNP: BNP (last 3 results) No results for input(s): BNP in the last 8760 hours.  ProBNP (last 3 results) No results for input(s): PROBNP in the last 8760 hours.  CBG:  Recent Labs Lab 06/08/15 1304  GLUCAP 82       Signed:  Velvet Bathe  [facility] 06/12/2015, 11:16 AM   Chart reviewed and ok for discharge. VSS

## 2015-06-11 NOTE — Care Management Important Message (Signed)
Important Message  Patient Details  Name: Shannon Garrett MRN: [id number] Date of Birth: 1961-01-09   Medicare Important Message Given:  Yes    Erenest Rasher, RN 06/11/2015, 4:43 PM

## 2015-06-11 NOTE — Progress Notes (Signed)
[facility] is reviewing pt's d/c information and PT eval and will likely be prepared to accept pt in am.  RN and sister updated.  Creta Levin, LCSW (Coverage) [phone]

## 2015-06-12 NOTE — Clinical Social Work Note (Addendum)
Patient alert and oriented x4 this morning. She states she is agreeable to return to [place] but does want her sister [name] included in Dc plans. Unable to reach [facility] by phone, friend Sunday Spillers was contacted. Sunday Spillers states that [name] had decided that facility offering in [facility] was not an option they wanted to pursue. Patient's only option is to return to [facility] until arrangements can be made for the patient to move to a different facility. Sunday Spillers will pass information along to [name] and have [name] call CSW ASAP.    UPDATE: CSW received call from sister [name], she is agreeable to patient returning to [place] today.   Liz Beach MSW, [place], [place], JI:[id number]

## 2015-06-12 NOTE — Clinical Social Work Note (Signed)
Per MD patient ready to DC back to [place], patient/family, and facility notified of patient's DC. RN given number for report. DC packet on patient's chart. Ambulance transport requested for patient for 1PM. CSW signing off at this time.   Liz Beach MSW, [place], [place], JI:[id number]

## 2015-06-12 NOTE — Care Management Note (Signed)
Case Management Note  Patient Details  Name: Shannon Garrett MRN: [id number] Date of Birth: [birth year]  Subjective/Objective:   Multiple sclerosis exacerbation                  Action/Plan: Scheduled dc back to SNF. Pt states she wishes she can go to another facility. States she was at [facility] but they will not accept her back. CSW following for SNF placement.   Expected Discharge Date:  06/12/2015               Expected Discharge Plan:  Skilled Nursing Facility  In-House Referral:  Clinical Social Work  Discharge planning Services  CM Consult  Post Acute Care Choice:  NA Choice offered to:  NA  DME Arranged:  N/A DME Agency:  NA  HH Arranged:  NA HH Agency:  NA  Status of Service:  Completed, signed off  Medicare Important Message Given:  Yes Date Medicare IM Given:    Medicare IM give by:    Date Additional Medicare IM Given:    Additional Medicare Important Message give by:     If discussed at [place] of Stay Meetings, dates discussed:    Additional Comments:  Erenest Rasher, RN 06/12/2015, 12:01 PM

## 2015-06-13 ENCOUNTER — Encounter: Payer: Self-pay | Admitting: Internal Medicine

## 2015-06-13 ENCOUNTER — Non-Acute Institutional Stay (SKILLED_NURSING_FACILITY): Payer: Medicare Other | Admitting: Internal Medicine

## 2015-06-13 DIAGNOSIS — N390 Urinary tract infection, site not specified: Secondary | ICD-10-CM

## 2015-06-13 DIAGNOSIS — I110 Hypertensive heart disease with heart failure: Secondary | ICD-10-CM

## 2015-06-13 DIAGNOSIS — G35 Multiple sclerosis: Secondary | ICD-10-CM

## 2015-06-13 DIAGNOSIS — N319 Neuromuscular dysfunction of bladder, unspecified: Secondary | ICD-10-CM

## 2015-06-13 DIAGNOSIS — I5042 Chronic combined systolic (congestive) and diastolic (congestive) heart failure: Secondary | ICD-10-CM

## 2015-06-13 DIAGNOSIS — E559 Vitamin D deficiency, unspecified: Secondary | ICD-10-CM

## 2015-06-13 LAB — CULTURE, BLOOD (ROUTINE X 2)
Culture: NO GROWTH
Culture: NO GROWTH

## 2015-06-13 NOTE — Progress Notes (Signed)
MRN: [id number] Name: Shannon Garrett  Sex: female Age: 54 y.o. DOB: 1960/09/28  [place] #: Karren Burly Facility/Room: Level Of Care: SNF Provider: Inocencio Homes D Emergency Contacts: Extended Emergency Contact Information Primary Emergency Contact: [place], [place] [address] [place] Phone: [phone] Relation: Sister Secondary Emergency Contact: Jefm Bryant, [place] Phone: [phone] Mobile Phone: [phone] Relation: Friend  Code Status:   Allergies: Sulfonamide derivatives and Penicillins  Chief Complaint  Patient presents with  . Readmit To SNF    HPI: Patient is 54 y.o. female with history of Multiple Sclerosis , Systolic CHf, HTN, Glaucoma who was sent for the [facility] SNF to the ED due to increasing Lethargy over the past 2 days. In the ED she was evaluated and sent for a CT scan and an MRI which were negative for acute findings, and a urinalysis was positive. Pt was admitted to [facility] from 12/7-10 where she was treated for an ongoing UTI. Neuro was consulted and felt her sx were due to an exacerbation of MS but no new treatment or med changes were made.Pt is admitted to SNF for generalized weakness and residential care. While at SNF pt will be followed for HTN, tx with coreg, lasix and spironolactone, Vit D def, tx with supplement, and neurogenic bladder tx with flomax and vesicare.  Past Medical History  Diagnosis Date  . MS (multiple sclerosis) ([place])     a. Dx'd late 20's. b. Tx with Novantrone, Tysabri, Copaxone previously.  Marland Kitchen Hypertension   . Depression   . Glaucoma   . Cardiomyopathy ([place])     a.  Echo 04/29/12: Mild LVH, EF 20-25%, mild AI, moderate MR, moderate LAE, mild RAE, mild RVE, moderate TR, PASP 51, small pericardial effusion;   b. probably non-ischemic given multiple chemo-Tx agents used for MS and global LV dysfn on echo  . Chronic systolic heart failure [facility])      Past Surgical History  Procedure Laterality Date  . Ablation      uterine  . Cesarean section        Medication List       This list is accurate as of: 06/13/15 11:59 PM.  Always use your most recent med list.               amantadine 100 MG capsule  Commonly known as:  SYMMETREL  Take 100 mg by mouth daily.     baclofen 20 MG tablet  Commonly known as:  LIORESAL  Take 20 mg by mouth 3 (three) times daily.     carvedilol 12.5 MG tablet  Commonly known as:  COREG  Take 1 tablet (12.5 mg total) by mouth 2 (two) times daily with a meal.     cefdinir 300 MG capsule  Commonly known as:  OMNICEF  Take 1 capsule (300 mg total) by mouth 2 (two) times daily.     CRANBERRY PO  Take 100 mg by mouth daily.     docusate sodium 100 MG capsule  Commonly known as:  COLACE  Take 100 mg by mouth 2 (two) times daily.     DULoxetine 60 MG capsule  Commonly known as:  CYMBALTA  Take 60 mg by mouth daily.     furosemide 80 MG tablet  Commonly known as:  LASIX  Take 1 tablet (80 mg total) by mouth 2 (two)  times daily.     gabapentin 300 MG capsule  Commonly known as:  NEURONTIN  Take 3 capsules (900 mg total) by mouth 3 (three) times daily.     imipramine 50 MG tablet  Commonly known as:  TOFRANIL  Take 1.5 tablets (75 mg total) by mouth every evening. For depressive disorder     magnesium hydroxide 400 MG/5ML suspension  Commonly known as:  MILK OF MAGNESIA  Take 30 mLs by mouth every 8 (eight) hours as needed for mild constipation or moderate constipation.     natalizumab 300 MG/15ML injection  Commonly known as:  TYSABRI  Inject 300 mg into the vein every 30 (thirty) days.     polyethylene glycol packet  Commonly known as:  MIRALAX / GLYCOLAX  Take 17 g by mouth 2 (two) times daily.     potassium chloride 20 MEQ packet  Commonly known as:  KLOR-CON  Take 20 mEq by mouth daily.     solifenacin 10 MG tablet  Commonly known as:  VESICARE  Take 10 mg by mouth  daily. For bladder spasms     spironolactone 25 MG tablet  Commonly known as:  ALDACTONE  Take 25 mg by mouth daily. For CHF     SYSTANE BALANCE 0.6 % Soln  Generic drug:  Propylene Glycol  Place 1 drop into both eyes 2 (two) times daily.     tamsulosin 0.4 MG Caps capsule  Commonly known as:  FLOMAX  Take 0.4 mg by mouth daily. For incontinence     VITAMIN B-12 PO  Take 1 tablet by mouth daily.     VITAMIN D-3 PO  Take 1 tablet by mouth daily.     XALATAN 0.005 % ophthalmic solution  Generic drug:  latanoprost  Place 1 drop into both eyes at bedtime.        No orders of the defined types were placed in this encounter.    Immunization History  Administered Date(s) Administered  . Influenza,inj,Quad PF,36+ Mos 08/01/2013  . Influenza-Unspecified 08/31/2014  . Pneumococcal Polysaccharide-23 08/01/2013  . Pneumococcal-Unspecified 08/30/2013, 07/29/2014    Social History  Substance Use Topics  . Smoking status: Former Research scientist (life sciences)  . Smokeless tobacco: Never Used  . Alcohol Use: No    Family history is + HTN, CA, MS    Review of Systems  DATA OBTAINED: from patient, nurse GENERAL:  no fevers, fatigue, appetite changes SKIN: No itching, rash or wounds EYES: No eye pain, redness, discharge EARS: No earache, tinnitus, change in hearing NOSE: No congestion, drainage or bleeding  MOUTH/THROAT: No mouth or tooth pain, No sore throat RESPIRATORY: No cough, wheezing, SOB CARDIAC: No chest pain, palpitations, lower extremity edema  GI: No abdominal pain, No N/V/D or constipation, No heartburn or reflux  GU: No dysuria, frequency or urgency, or incontinence  MUSCULOSKELETAL: No unrelieved bone/joint pain NEUROLOGIC: No headache, dizziness or focal weakness PSYCHIATRIC: No c/o anxiety or sadness   Filed Vitals:   06/18/15 1928  BP: 117/49  Pulse: 97  Temp: 98.5 F (36.9 C)  Resp: 22    SpO2 Readings from Last 1 Encounters:  06/12/15 100%        Physical  Exam  GENERAL APPEARANCE: Alert, conversant,  No acute distress.  SKIN: No diaphoresis rash HEAD: Normocephalic, atraumatic  EYES: Conjunctiva/lids clear. Pupils round, reactive. EOMs intact.  EARS: External exam WNL, canals clear. Hearing grossly normal.  NOSE: No deformity or discharge.  MOUTH/THROAT: Lips w/o lesions  RESPIRATORY: Breathing is  even, unlabored. Lung sounds are clear   CARDIOVASCULAR: Heart RRR no murmurs, rubs or gallops. No peripheral edema.   GASTROINTESTINAL: Abdomen is soft, non-tender, not distended w/ normal bowel sounds. GENITOURINARY: Bladder non tender, not distended  MUSCULOSKELETAL: wasting and contractures NEUROLOGIC:  Cranial nerves 2-12 grossly intact; some movement of BUE extremities  PSYCHIATRIC: Mood and affect appropriate to situation, no behavioral issues  Patient Active Problem List   Diagnosis Date Noted  . Vitamin D deficiency 06/18/2015  . Multiple sclerosis exacerbation ([place]) 06/08/2015  . Weakness 06/08/2015  . Chronic systolic (congestive) heart failure ([place]) 06/08/2015  . Acute encephalopathy 06/03/2015  . Acute kidney injury ([place]) 06/03/2015  . Hypokalemia 06/03/2015  . Sepsis secondary to UTI ([place]) 05/30/2015  . Hypernatremia 05/30/2015  . Dehydration 05/30/2015  . Chronic anemia 05/30/2015  . Chronic pain syndrome 03/03/2015  . Benign hypertensive heart disease without heart failure 01/18/2015  . Urinary tract infection due to Enterococcus   . Essential hypertension   . Abnormal LFTs   . Foot drop, bilateral   . Pressure ulcer 01/12/2015  . UTI (lower urinary tract infection) 01/11/2015  . Hypovolemia 01/11/2015  . UTI (urinary tract infection), uncomplicated   . Cardiomyopathy ([place])   . Urinary incontinence 01/07/2015  . Chronic constipation 11/26/2014  . Depression 11/26/2014  . Tachycardia 02/20/2014  . Venous stasis ulcers ([place]) 02/20/2014  . Leg wound, left 11/04/2013  . FTT (failure to thrive) in adult 07/31/2013   . Medically noncompliant 07/31/2013  . Blisters of multiple sites 07/31/2013  . Generalized weakness 06/03/2013  . Insomnia 06/03/2013  . Neurogenic bladder 06/03/2013  . DS (disseminated sclerosis) ([place]) 02/12/2013  . Chronic diastolic heart failure ([place]) 09/29/2012  . Elevated CK 09/10/2012  . Severe malnutrition ([place]) 09/05/2012  . Paraplegia ([place]) 09/05/2012  . Fall 09/04/2012  . Chronic combined systolic and diastolic congestive heart failure ([place]) 05/22/2012  . Other primary cardiomyopathies 05/01/2012  . Immobility 04/28/2012  . OBESITY 11/30/2008  . ANEMIA, CHRONIC 11/30/2008  . MULTIPLE SCLEROSIS, PROGRESSIVE/RELAPSING 11/30/2008  . Hypertensive heart disease with CHF (congestive heart failure) ([place]) 11/30/2008  . ECZEMA 11/30/2008    CBC    Component Value Date/Time   WBC 7.3 06/09/2015 0540   WBC 6.2 05/18/2015 1626   RBC 3.63* 06/09/2015 0540   RBC 3.55* 05/18/2015 1626   RBC 3.70* 01/12/2015 2111   HGB 9.7* 06/09/2015 0540   HCT 29.9* 06/09/2015 0540   HCT 29.6* 05/18/2015 1626   PLT 293 06/09/2015 0540   MCV 82.4 06/09/2015 0540   LYMPHSABS 2.5 06/08/2015 1326   LYMPHSABS 2.1 05/18/2015 1626   MONOABS 0.6 06/08/2015 1326   EOSABS 0.5 06/08/2015 1326   BASOSABS 0.1 06/08/2015 1326   BASOSABS 0.1 05/18/2015 1626    CMP     Component Value Date/Time   NA 140 06/09/2015 0540   NA 142 09/28/2014   K 3.1* 06/09/2015 0540   CL 105 06/09/2015 0540   CO2 28 06/09/2015 0540   GLUCOSE 100* 06/09/2015 0540   BUN 10 06/09/2015 0540   BUN 16 09/28/2014   CREATININE 0.93 06/09/2015 0540   CALCIUM 9.3 06/09/2015 0540   PROT 6.9 06/08/2015 1326   ALBUMIN 3.9 06/08/2015 1326   AST 33 06/08/2015 1326   ALT 99* 06/08/2015 1326   ALKPHOS 191* 06/08/2015 1326   BILITOT 0.4 06/08/2015 1326   GFRNONAA >60 06/09/2015 0540   GFRAA >60 06/09/2015 0540    Lab Results  Component Value Date   HGBA1C  5.3 01/11/2015     Ct Head Wo Contrast  06/08/2015  CLINICAL  DATA:  Sepsis.  Lethargy and confusion. EXAM: CT HEAD WITHOUT CONTRAST TECHNIQUE: Contiguous axial images were obtained from the base of the skull through the vertex without intravenous contrast. COMPARISON:  01/10/2015 FINDINGS: Mild global atrophy. There are prominent chronic ischemic changes in the periventricular white matter. These findings are stable. No mass effect, midline shift, or acute hemorrhage. Mastoid air cells are clear. Cranium is intact. IMPRESSION: No acute intracranial pathology. Electronically Signed   By: Marybelle Killings M.D.   On: 06/08/2015 16:16   Mr Brain Wo Contrast  06/08/2015  CLINICAL DATA:  Altered mental status. Right-sided weakness. Lethargy and increased confusion. EXAM: MRI HEAD WITHOUT CONTRAST TECHNIQUE: Multiplanar, multiecho pulse sequences of the brain and surrounding structures were obtained without intravenous contrast. COMPARISON:  CT head without contrast from the same day. MRI brain 10/08/2014 FINDINGS: Extensive periventricular and subcortical T2 changes are stable bilaterally. There is extensive involvement of the colossoseptal margin. No new lesions are present. There is no restricted diffusion. The ventricles are proportionate to the degree of atrophy. No significant extra-axial fluid collection is present. The brainstem and cerebellum are within normal limits. The internal auditory canals are normal. Flow is present in the major intracranial arteries. The globes and orbits are intact. The paranasal sinuses and mastoid air cells are clear. IMPRESSION: 1. Stable diffuse white matter changes compatible with the given diagnosis of multiple sclerosis. 2. No acute intracranial abnormalities. Electronically Signed   By: San Morelle M.D.   On: 06/08/2015 19:10   Dg Chest Port 1 View  06/08/2015  CLINICAL DATA:  Sepsis, weakness EXAM: PORTABLE CHEST 1 VIEW COMPARISON:  05/30/2015 FINDINGS: Cardiomediastinal silhouette is stable. No acute infiltrate or pleural  effusion. No pulmonary edema. Study is limited by poor inspiration. IMPRESSION: No active disease. Electronically Signed   By: Lahoma Crocker M.D.   On: 06/08/2015 15:42    Not all labs, radiology exams or other studies done during hospitalization come through on my EPIC note; however they are reviewed by me.    Assessment and Plan  Multiple sclerosis exacerbation [facility]) Neurology on board who is currently not recommending any changes in current management. - No further neurodiagnostic studies indicated at this point. - AMS improving with antibiotic therapy   UTI (lower urinary tract infection) - Pt improved on Rocephin. Will d/c on omnicef - urine culture reporting no growth. May have been because patient was on cipro prior to collecting. SNF - continue omnicef for 3 more days  Hypertensive heart disease with CHF (congestive heart failure) (HCC) SNF - controlled on Coreg and spironolactone and lasix;plan - cont all  Chronic combined systolic and diastolic congestive heart failure SNF - stable on coreg and lasix  Neurogenic bladder SNF - cint vesicare and flomax  Vitamin D deficiency SNF - cont vit D 1000 u daily   Time spent > 45 min;> 50% of time with patient was spent reviewing records, labs, tests and studies, counseling and developing plan of care  Hennie Duos, MD

## 2015-06-18 ENCOUNTER — Encounter: Payer: Self-pay | Admitting: Internal Medicine

## 2015-06-18 DIAGNOSIS — E559 Vitamin D deficiency, unspecified: Secondary | ICD-10-CM | POA: Insufficient documentation

## 2015-06-18 NOTE — Assessment & Plan Note (Signed)
Secondary to sepsis. on multiple meds including baclofen, Neurontin, antidepressants and Ambien which are held. Mental status much improved and patient also to communicate is will oriented and answering questions appropriately.

## 2015-06-18 NOTE — Assessment & Plan Note (Signed)
Patient admitted to stepdown and given aggressive IV hydration. Placed on empiric Rocephin and vancomycin (given history of enterococcus). Urine culture with mixed bacteria. . Leukocytosis and fever improved. Mental status much improved and answering appropriately. Antibiotic narrowed to IV Rocephin. SNF - oral ciprofloxacin to complete a total 10 day course of antibiotic. Stop date 06/09/2015.

## 2015-06-18 NOTE — Assessment & Plan Note (Signed)
SNF - cont flomax and vesicare

## 2015-06-18 NOTE — Assessment & Plan Note (Signed)
Possibly severe to sepsis from UTI and dehydration. Improving with aggressive hydration. Held lasix and aldactone. SNF - lasix and aldactone restarted; monitor with BMP

## 2015-06-18 NOTE — Assessment & Plan Note (Signed)
SNF - cont vit D 1000 u daily

## 2015-06-18 NOTE — Assessment & Plan Note (Signed)
SNF - cont Vit B12; follow CBC

## 2015-06-18 NOTE — Assessment & Plan Note (Signed)
SNF - stable on coreg and lasix

## 2015-06-18 NOTE — Assessment & Plan Note (Signed)
SNF - controlled on Coreg and spironolactone and lasix;plan - cont all

## 2015-06-18 NOTE — Assessment & Plan Note (Signed)
-   Pt improved on Rocephin. Will d/c on omnicef - urine culture reporting no growth. May have been because patient was on cipro prior to collecting. SNF - continue omnicef for 3 more days

## 2015-06-18 NOTE — Assessment & Plan Note (Signed)
SNF - Resume baclofen,Continue amantadine, Continue monthly injections as outpatient.

## 2015-06-18 NOTE — Assessment & Plan Note (Signed)
SNF - Resume cymbalta and imipramine.

## 2015-06-18 NOTE — Assessment & Plan Note (Signed)
Neurology on board who is currently not recommending any changes in current management. - No further neurodiagnostic studies indicated at this point. - AMS improving with antibiotic therapy

## 2015-06-18 NOTE — Assessment & Plan Note (Signed)
SNF - cint vesicare and flomax

## 2015-07-04 ENCOUNTER — Emergency Department (HOSPITAL_COMMUNITY): Payer: Medicare Other

## 2015-07-04 ENCOUNTER — Encounter (HOSPITAL_COMMUNITY): Payer: Self-pay

## 2015-07-04 ENCOUNTER — Inpatient Hospital Stay (HOSPITAL_COMMUNITY)
Admission: EM | Admit: 2015-07-04 | Discharge: 2015-07-08 | DRG: 871 | Disposition: A | Discharge status: Skilled Nursing Facility | Payer: Medicare Other | Attending: Internal Medicine | Admitting: Internal Medicine

## 2015-07-04 DIAGNOSIS — N390 Urinary tract infection, site not specified: Secondary | ICD-10-CM | POA: Diagnosis not present

## 2015-07-04 DIAGNOSIS — G9341 Metabolic encephalopathy: Secondary | ICD-10-CM | POA: Diagnosis present

## 2015-07-04 DIAGNOSIS — Z87891 Personal history of nicotine dependence: Secondary | ICD-10-CM

## 2015-07-04 DIAGNOSIS — Z6841 Body Mass Index (BMI) 40.0 and over, adult: Secondary | ICD-10-CM | POA: Diagnosis not present

## 2015-07-04 DIAGNOSIS — G9340 Encephalopathy, unspecified: Secondary | ICD-10-CM | POA: Diagnosis not present

## 2015-07-04 DIAGNOSIS — Z8249 Family history of ischemic heart disease and other diseases of the circulatory system: Secondary | ICD-10-CM

## 2015-07-04 DIAGNOSIS — Z809 Family history of malignant neoplasm, unspecified: Secondary | ICD-10-CM | POA: Diagnosis not present

## 2015-07-04 DIAGNOSIS — G35 Multiple sclerosis: Secondary | ICD-10-CM | POA: Diagnosis present

## 2015-07-04 DIAGNOSIS — F329 Major depressive disorder, single episode, unspecified: Secondary | ICD-10-CM | POA: Diagnosis present

## 2015-07-04 DIAGNOSIS — I5042 Chronic combined systolic (congestive) and diastolic (congestive) heart failure: Secondary | ICD-10-CM | POA: Diagnosis present

## 2015-07-04 DIAGNOSIS — Z880 Allergy status to penicillin: Secondary | ICD-10-CM | POA: Diagnosis not present

## 2015-07-04 DIAGNOSIS — I110 Hypertensive heart disease with heart failure: Secondary | ICD-10-CM | POA: Diagnosis present

## 2015-07-04 DIAGNOSIS — R6520 Severe sepsis without septic shock: Secondary | ICD-10-CM | POA: Diagnosis present

## 2015-07-04 DIAGNOSIS — A419 Sepsis, unspecified organism: Secondary | ICD-10-CM | POA: Diagnosis present

## 2015-07-04 DIAGNOSIS — I119 Hypertensive heart disease without heart failure: Secondary | ICD-10-CM | POA: Diagnosis not present

## 2015-07-04 DIAGNOSIS — I5032 Chronic diastolic (congestive) heart failure: Secondary | ICD-10-CM | POA: Diagnosis present

## 2015-07-04 DIAGNOSIS — G894 Chronic pain syndrome: Secondary | ICD-10-CM | POA: Diagnosis present

## 2015-07-04 DIAGNOSIS — Z820 Family history of epilepsy and other diseases of the nervous system: Secondary | ICD-10-CM

## 2015-07-04 DIAGNOSIS — I429 Cardiomyopathy, unspecified: Secondary | ICD-10-CM | POA: Diagnosis present

## 2015-07-04 DIAGNOSIS — Z882 Allergy status to sulfonamides status: Secondary | ICD-10-CM

## 2015-07-04 DIAGNOSIS — E669 Obesity, unspecified: Secondary | ICD-10-CM | POA: Diagnosis present

## 2015-07-04 DIAGNOSIS — I4581 Long QT syndrome: Secondary | ICD-10-CM | POA: Diagnosis present

## 2015-07-04 DIAGNOSIS — H409 Unspecified glaucoma: Secondary | ICD-10-CM | POA: Diagnosis present

## 2015-07-04 DIAGNOSIS — E870 Hyperosmolality and hypernatremia: Secondary | ICD-10-CM | POA: Diagnosis present

## 2015-07-04 DIAGNOSIS — Z781 Physical restraint status: Secondary | ICD-10-CM

## 2015-07-04 DIAGNOSIS — A499 Bacterial infection, unspecified: Secondary | ICD-10-CM | POA: Diagnosis present

## 2015-07-04 DIAGNOSIS — N179 Acute kidney failure, unspecified: Secondary | ICD-10-CM

## 2015-07-04 LAB — CBC WITH DIFFERENTIAL/PLATELET
BASOS PCT: 0 %
Basophils Absolute: 0 10*3/uL (ref 0.0–0.1)
EOS ABS: 0.1 10*3/uL (ref 0.0–0.7)
EOS PCT: 1 %
HCT: 36.5 % (ref 36.0–46.0)
HEMOGLOBIN: 12 g/dL (ref 12.0–15.0)
Lymphocytes Relative: 12 %
Lymphs Abs: 1.1 10*3/uL (ref 0.7–4.0)
MCH: 27.3 pg (ref 26.0–34.0)
MCHC: 32.9 g/dL (ref 30.0–36.0)
MCV: 83 fL (ref 78.0–100.0)
MONOS PCT: 7 %
Monocytes Absolute: 0.6 10*3/uL (ref 0.1–1.0)
NEUTROS PCT: 80 %
Neutro Abs: 7.6 10*3/uL (ref 1.7–7.7)
Platelets: 368 10*3/uL (ref 150–400)
RBC: 4.4 MIL/uL (ref 3.87–5.11)
RDW: 16 % — ABNORMAL HIGH (ref 11.5–15.5)
WBC: 9.4 10*3/uL (ref 4.0–10.5)

## 2015-07-04 LAB — COMPREHENSIVE METABOLIC PANEL
ALBUMIN: 4.1 g/dL (ref 3.5–5.0)
ALK PHOS: 216 U/L — AB (ref 38–126)
ALT: 30 U/L (ref 14–54)
ANION GAP: 14 (ref 5–15)
AST: 20 U/L (ref 15–41)
BUN: 20 mg/dL (ref 6–20)
CALCIUM: 10 mg/dL (ref 8.9–10.3)
CO2: 29 mmol/L (ref 22–32)
CREATININE: 1.21 mg/dL — AB (ref 0.44–1.00)
Chloride: 105 mmol/L (ref 101–111)
GFR calc Af Amer: 59 mL/min — ABNORMAL LOW (ref >60)
GFR calc non Af Amer: 51 mL/min — ABNORMAL LOW (ref >60)
GLUCOSE: 140 mg/dL — AB (ref 65–99)
Potassium: 3.8 mmol/L (ref 3.5–5.1)
SODIUM: 148 mmol/L — AB (ref 135–145)
Total Bilirubin: 0.8 mg/dL (ref 0.3–1.2)
Total Protein: 8.1 g/dL (ref 6.5–8.1)

## 2015-07-04 LAB — APTT: APTT: 36 s (ref 24–37)

## 2015-07-04 LAB — PROTIME-INR
INR: 1.27 (ref 0.00–1.49)
Prothrombin Time: 16.1 seconds — ABNORMAL HIGH (ref 11.6–15.2)

## 2015-07-04 LAB — URINALYSIS, ROUTINE W REFLEX MICROSCOPIC
BILIRUBIN URINE: NEGATIVE
Glucose, UA: NEGATIVE mg/dL
KETONES UR: NEGATIVE mg/dL
NITRITE: NEGATIVE
PH: 7 (ref 5.0–8.0)
Protein, ur: 30 mg/dL — AB
Specific Gravity, Urine: 1.013 (ref 1.005–1.030)

## 2015-07-04 LAB — PROCALCITONIN: Procalcitonin: <0.1 ng/mL

## 2015-07-04 LAB — LACTIC ACID, PLASMA
Lactic Acid, Venous: 1.5 mmol/L (ref 0.5–2.0)
Lactic Acid, Venous: 1.9 mmol/L (ref 0.5–2.0)

## 2015-07-04 LAB — URINE MICROSCOPIC-ADD ON

## 2015-07-04 LAB — I-STAT TROPONIN, ED: TROPONIN I, POC: 0.01 ng/mL (ref 0.00–0.08)

## 2015-07-04 LAB — MRSA PCR SCREENING: MRSA by PCR: NEGATIVE

## 2015-07-04 MED ORDER — SODIUM CHLORIDE 0.9 % IV SOLN: INTRAVENOUS | Status: DC

## 2015-07-04 MED ORDER — SODIUM CHLORIDE 0.9 % IJ SOLN
3.0000 mL | Freq: Two times a day (BID) | INTRAMUSCULAR | Status: DC
  Administered 2015-07-04 – 2015-07-08 (×3): 3 mL via INTRAVENOUS

## 2015-07-04 MED ORDER — ONDANSETRON HCL 4 MG PO TABS: 4.0000 mg | ORAL_TABLET | Freq: Four times a day (QID) | ORAL | Status: DC | PRN

## 2015-07-04 MED ORDER — SODIUM CHLORIDE 0.9 % IV SOLN
INTRAVENOUS | Status: DC
  Administered 2015-07-04: 17:00:00 via INTRAVENOUS

## 2015-07-04 MED ORDER — SODIUM CHLORIDE 0.9 % IV BOLUS (SEPSIS)
1000.0000 mL | Freq: Once | INTRAVENOUS | Status: AC
  Administered 2015-07-04: 1000 mL via INTRAVENOUS

## 2015-07-04 MED ORDER — CEFEPIME HCL 2 G IJ SOLR: 2.0000 g | Freq: Once | INTRAMUSCULAR | Status: DC

## 2015-07-04 MED ORDER — ACETAMINOPHEN 650 MG RE SUPP: 650.0000 mg | Freq: Four times a day (QID) | RECTAL | Status: DC | PRN

## 2015-07-04 MED ORDER — ACETAMINOPHEN 325 MG PO TABS
650.0000 mg | ORAL_TABLET | Freq: Four times a day (QID) | ORAL | Status: DC | PRN
  Administered 2015-07-05: 650 mg via ORAL
  Filled 2015-07-04: qty 2

## 2015-07-04 MED ORDER — SODIUM CHLORIDE 0.9 % IV BOLUS (SEPSIS)
500.0000 mL | Freq: Once | INTRAVENOUS | Status: AC
  Administered 2015-07-04: 500 mL via INTRAVENOUS

## 2015-07-04 MED ORDER — ENOXAPARIN SODIUM 40 MG/0.4ML ~~LOC~~ SOLN
40.0000 mg | SUBCUTANEOUS | Status: DC
  Administered 2015-07-05 – 2015-07-07 (×3): 40 mg via SUBCUTANEOUS
  Filled 2015-07-04 (×5): qty 0.4

## 2015-07-04 MED ORDER — ACETAMINOPHEN 325 MG PO TABS
650.0000 mg | ORAL_TABLET | Freq: Once | ORAL | Status: AC
  Administered 2015-07-04: 650 mg via ORAL
  Filled 2015-07-04: qty 2

## 2015-07-04 MED ORDER — SODIUM CHLORIDE 0.9 % IV SOLN
Freq: Once | INTRAVENOUS | Status: AC
  Administered 2015-07-04: 14:00:00 via INTRAVENOUS

## 2015-07-04 MED ORDER — METOPROLOL TARTRATE 1 MG/ML IV SOLN
5.0000 mg | Freq: Three times a day (TID) | INTRAVENOUS | Status: DC
  Administered 2015-07-04 – 2015-07-05 (×3): 5 mg via INTRAVENOUS
  Filled 2015-07-04 (×3): qty 5

## 2015-07-04 MED ORDER — ONDANSETRON HCL 4 MG/2ML IJ SOLN: 4.0000 mg | Freq: Four times a day (QID) | INTRAMUSCULAR | Status: DC | PRN

## 2015-07-04 MED ORDER — VANCOMYCIN HCL IN DEXTROSE 750-5 MG/150ML-% IV SOLN
750.0000 mg | Freq: Two times a day (BID) | INTRAVENOUS | Status: DC
  Administered 2015-07-05: 750 mg via INTRAVENOUS
  Filled 2015-07-04: qty 150

## 2015-07-04 MED ORDER — VANCOMYCIN HCL 10 G IV SOLR
2000.0000 mg | INTRAVENOUS | Status: AC
  Administered 2015-07-04: 2000 mg via INTRAVENOUS
  Filled 2015-07-04: qty 2000

## 2015-07-04 MED ORDER — DEXTROSE 5 % IV SOLN
1.0000 g | Freq: Once | INTRAVENOUS | Status: AC
  Administered 2015-07-04: 1 g via INTRAVENOUS
  Filled 2015-07-04: qty 10

## 2015-07-04 MED ORDER — LATANOPROST 0.005 % OP SOLN
1.0000 [drp] | Freq: Every day | OPHTHALMIC | Status: DC
  Administered 2015-07-04 – 2015-07-07 (×4): 1 [drp] via OPHTHALMIC
  Filled 2015-07-04: qty 2.5

## 2015-07-04 MED ORDER — DEXTROSE 5 % IV SOLN
1.0000 g | Freq: Two times a day (BID) | INTRAVENOUS | Status: DC
  Administered 2015-07-04: 1 g via INTRAVENOUS
  Filled 2015-07-04 (×2): qty 1

## 2015-07-04 MED ORDER — HALOPERIDOL LACTATE 5 MG/ML IJ SOLN
5.0000 mg | Freq: Once | INTRAMUSCULAR | Status: AC
  Administered 2015-07-04: 5 mg via INTRAMUSCULAR
  Filled 2015-07-04: qty 1

## 2015-07-04 MED ORDER — LORAZEPAM 2 MG/ML IJ SOLN: 1.0000 mg | Freq: Four times a day (QID) | INTRAMUSCULAR | Status: DC | PRN

## 2015-07-04 NOTE — ED Notes (Signed)
Pt would not allow me to take any vitals at this time. Nurse have been info

## 2015-07-04 NOTE — ED Notes (Signed)
Report to given to [facility]
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
## 2015-07-04 NOTE — Progress Notes (Signed)
Nurse called MD Dhungel and notified of patient condition. Patient currently removed IV, has removed monitor leads including SPO2 monitor and BP cuff. Patient is currently refusing any medications, refusing to have any IV's placed, refusing to have monitor leads and accessories placed on body. Patient is alert to self, situation, and place.Per MD Dhungel place patient in bilateral wrist restraints and soft waist belt. Restart IV access and then give IV ativan and place foley for urinary retention. Nurse reinforced that patient was alert to self, situation and place. Will continue to monitor

## 2015-07-04 NOTE — ED Notes (Signed)
Pt remains struggling with staff.  Will try for labs and additional procedures once patient is calmer.
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
## 2015-07-04 NOTE — ED Notes (Signed)
Bed: [id number] Expected date:  Expected time:  Means of arrival:  Comments: EMS-confused/UTI

## 2015-07-04 NOTE — ED Notes (Signed)
Main Lab going to come attempt to get cultures

## 2015-07-04 NOTE — Progress Notes (Signed)
ANTIBIOTIC CONSULT NOTE - INITIAL  Pharmacy Consult for Vancomycin / Cefepime Indication: UTI / Sepsis  Allergies  Allergen Reactions  . Sulfonamide Derivatives Hives and Shortness Of Breath  . Penicillins Hives and Itching    Has patient had a PCN reaction causing immediate rash, facial/tongue/throat swelling, SOB or lightheadedness with hypotension: unknown Has patient had a PCN reaction causing severe rash involving mucus membranes or skin necrosis: unknown Has patient had a PCN reaction that required hospitalization: unknown Has patient had a PCN reaction occurring within the last 10 years: unknown If all of the above answers are "NO", then may proceed with Cephalosporin use. Prior course of rocephin charted 05/2015    Patient Measurements:   Adjusted Body Weight:   Vital Signs: Temp: 100 F (37.8 C) (01/02 1537) Temp Source: Oral (01/02 1537) BP: 135/93 mmHg (01/02 1537) Pulse Rate: 120 (01/02 1537) Intake/Output from previous day:   Intake/Output from this shift:    Labs:  Recent Labs  07/04/15 1227  WBC 9.4  HGB 12.0  PLT 368  CREATININE 1.21*   CrCl cannot be calculated (Unknown ideal weight.). No results for input(s): VANCOTROUGH, VANCOPEAK, VANCORANDOM, GENTTROUGH, GENTPEAK, GENTRANDOM, TOBRATROUGH, TOBRAPEAK, TOBRARND, AMIKACINPEAK, AMIKACINTROU, AMIKACIN in the last 72 hours.   Microbiology: Recent Results (from the past 720 hour(s))  Culture, blood (routine x 2)     Status: None   Collection Time: 06/08/15  1:00 PM  Result Value Ref Range Status   Specimen Description BLOOD LEFT FOREARM  Final   Special Requests BOTTLES DRAWN AEROBIC ONLY 6ML  Final   Culture   Final    NO GROWTH 5 DAYS Performed at [facility]    Report Status 06/13/2015 FINAL  Final  Culture, blood (routine x 2)     Status: None   Collection Time: 06/08/15  1:55 PM  Result Value Ref Range Status   Specimen Description BLOOD RIGHT ANTECUBITAL  Final   Special Requests  BOTTLES DRAWN AEROBIC ONLY 8ML  Final   Culture   Final    NO GROWTH 5 DAYS Performed at [facility]    Report Status 06/13/2015 FINAL  Final  Urine culture     Status: None   Collection Time: 06/08/15  2:04 PM  Result Value Ref Range Status   Specimen Description URINE, CATHETERIZED  Final   Special Requests NONE  Final   Culture   Final    NO GROWTH 1 DAY Performed at [facility]    Report Status 06/09/2015 FINAL  Final    Medical History: Past Medical History  Diagnosis Date  . MS (multiple sclerosis) ([place])     a. Dx'd late 20's. b. Tx with Novantrone, Tysabri, Copaxone previously.  Marland Kitchen Hypertension   . Depression   . Glaucoma   . Cardiomyopathy ([place])     a.  Echo 04/29/12: Mild LVH, EF 20-25%, mild AI, moderate MR, moderate LAE, mild RAE, mild RVE, moderate TR, PASP 51, small pericardial effusion;   b. probably non-ischemic given multiple chemo-Tx agents used for MS and global LV dysfn on echo  . Chronic systolic heart failure ([facility])    Assessment: 42 yoF with hx progressive/relapsing MS, HTN, depression, cardiomyopathy, chronic HF, and recurrent UTIs (Enterococcus UTI 4/'16, 7/'16) presents to [facility] on 1/2 with agitation, found to be septic secondary to UTI.  U/A shows many bacteria (squamous epithelial cells 6-30), turbid appearance, with large leukocytes and WBC.  Pharmacy consulted to start Vancomycin and Cefepime.  Allergies noted  to sulfa (hives, SOB) and penicillins (hives, itching).  Ceftriaxone x 1 given in ED. Last documented weight = 99.9kg (06/12/15).    Anti-infectives 1/2 >> Ceftriaxone x1 1/2 >> Cefepime  >>  1/2 >> Vancomycin >>   Vitals/Labs WBC: WNL Tm24h: 100.5 Renal: SCr 1.21 (increased from Dec '16), CrCl estimated ~61 normalized  Cultures 1/2 bloodx2: Collected  1/2 urine: Collected   Previous vancomycin trough - 14 on 12/2 on Vanc 1500mg  IV q24h, SCr 0.8-1.1  Goal of Therapy:  Vancomycin trough level 15-20 mcg/ml  Eradication  of infection  Plan:  Vancomycin 2g IV x1, then 750mg  IV q12h Cefepime 1g IV q12h F/u renal fxn closely to adjust doses F/u vanc level at Css as warranted F/u cultures, clinical course  Ralene Bathe, PharmD, BCPS 07/04/2015, 4:22 PM  Pager: [phone]

## 2015-07-04 NOTE — ED Notes (Signed)
Delay in lab draw,  Pt not in room at this time. 
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
## 2015-07-05 DIAGNOSIS — G35 Multiple sclerosis: Secondary | ICD-10-CM

## 2015-07-05 LAB — BASIC METABOLIC PANEL
ANION GAP: 10 (ref 5–15)
BUN: 19 mg/dL (ref 6–20)
CALCIUM: 8.5 mg/dL — AB (ref 8.9–10.3)
CHLORIDE: 107 mmol/L (ref 101–111)
CO2: 26 mmol/L (ref 22–32)
Creatinine, Ser: 0.95 mg/dL (ref 0.44–1.00)
GFR calc Af Amer: >60 mL/min (ref >60)
GFR calc non Af Amer: >60 mL/min (ref >60)
GLUCOSE: 119 mg/dL — AB (ref 65–99)
Potassium: 3.6 mmol/L (ref 3.5–5.1)
Sodium: 143 mmol/L (ref 135–145)

## 2015-07-05 LAB — CBC
HEMATOCRIT: 33.2 % — AB (ref 36.0–46.0)
HEMOGLOBIN: 10.9 g/dL — AB (ref 12.0–15.0)
MCH: 27.1 pg (ref 26.0–34.0)
MCHC: 32.8 g/dL (ref 30.0–36.0)
MCV: 82.6 fL (ref 78.0–100.0)
Platelets: 307 10*3/uL (ref 150–400)
RBC: 4.02 MIL/uL (ref 3.87–5.11)
RDW: 16 % — ABNORMAL HIGH (ref 11.5–15.5)
WBC: 7.1 10*3/uL (ref 4.0–10.5)

## 2015-07-05 LAB — INFLUENZA PANEL BY PCR (TYPE A & B)
H1N1FLUPCR: NOT DETECTED
INFLBPCR: NEGATIVE
Influenza A By PCR: NEGATIVE

## 2015-07-05 MED ORDER — TAMSULOSIN HCL 0.4 MG PO CAPS
0.4000 mg | ORAL_CAPSULE | Freq: Every day | ORAL | Status: DC
  Administered 2015-07-05 – 2015-07-08 (×4): 0.4 mg via ORAL
  Filled 2015-07-05 (×4): qty 1

## 2015-07-05 MED ORDER — VITAMINS A & D EX OINT
TOPICAL_OINTMENT | CUTANEOUS | Status: AC
  Administered 2015-07-05: 11:00:00
  Filled 2015-07-05: qty 5

## 2015-07-05 MED ORDER — VANCOMYCIN HCL IN DEXTROSE 1-5 GM/200ML-% IV SOLN
1000.0000 mg | Freq: Two times a day (BID) | INTRAVENOUS | Status: DC
  Administered 2015-07-05 – 2015-07-06 (×2): 1000 mg via INTRAVENOUS
  Filled 2015-07-05 (×2): qty 200

## 2015-07-05 MED ORDER — SPIRONOLACTONE 25 MG PO TABS
25.0000 mg | ORAL_TABLET | Freq: Every day | ORAL | Status: DC
  Administered 2015-07-05 – 2015-07-08 (×4): 25 mg via ORAL
  Filled 2015-07-05 (×4): qty 1

## 2015-07-05 MED ORDER — AMANTADINE HCL 100 MG PO CAPS
100.0000 mg | ORAL_CAPSULE | Freq: Every day | ORAL | Status: DC
  Administered 2015-07-05 – 2015-07-08 (×4): 100 mg via ORAL
  Filled 2015-07-05 (×4): qty 1

## 2015-07-05 MED ORDER — CEFEPIME HCL 2 G IJ SOLR
2.0000 g | Freq: Two times a day (BID) | INTRAMUSCULAR | Status: DC
  Administered 2015-07-05 – 2015-07-08 (×6): 2 g via INTRAVENOUS
  Filled 2015-07-05 (×7): qty 2

## 2015-07-05 MED ORDER — FUROSEMIDE 80 MG PO TABS
80.0000 mg | ORAL_TABLET | Freq: Two times a day (BID) | ORAL | Status: DC
  Administered 2015-07-05 – 2015-07-08 (×7): 80 mg via ORAL
  Filled 2015-07-05: qty 2
  Filled 2015-07-05: qty 1
  Filled 2015-07-05 (×2): qty 2
  Filled 2015-07-05 (×2): qty 1
  Filled 2015-07-05: qty 2
  Filled 2015-07-05 (×2): qty 1

## 2015-07-05 MED ORDER — CARVEDILOL 12.5 MG PO TABS
12.5000 mg | ORAL_TABLET | Freq: Two times a day (BID) | ORAL | Status: DC
  Administered 2015-07-05 – 2015-07-08 (×7): 12.5 mg via ORAL
  Filled 2015-07-05: qty 1
  Filled 2015-07-05: qty 2
  Filled 2015-07-05 (×7): qty 1
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
## 2015-07-05 NOTE — Progress Notes (Signed)
Transfer from ICU, agree with previous nurses assessment with the following exception:  Integumentary:  Patient has moisture associated skin breakdown located on right and left side of buttocks.  Right side appears to have a healed area with possible new breakdown.

## 2015-07-05 NOTE — Progress Notes (Signed)
Date: July 05, 2015 Chart reviewed for concurrent status and case management needs. Will continue to follow patient for changes and needs: Rhonda Davis, RN, BSN, CCM   [phone] 

## 2015-07-05 NOTE — Progress Notes (Signed)
[facility] P[facility]M ICU medical director rounds  Patient: Shannon Garrett MR: [id number]  After reviewing the chart and discussing the patient with nursing staff in the ICU, it does not appear that the patient has a condition that requires ICU or step down level of care.  In an effort to best utilize system resources I have written orders to transfer the patient to a non-ICU/SDU inpatient unit.  Roselie Awkward, MD [place] [facility] Pager: [phone] Cell: [phone] After 3pm or if no response, call [phone]  07/05/2015@8 :35 AM

## 2015-07-05 NOTE — Progress Notes (Signed)
[place] for Vancomycin / Cefepime Indication: UTI / Sepsis  Allergies  Allergen Reactions  . Sulfonamide Derivatives Hives and Shortness Of Breath  . Penicillins Hives and Itching    Has patient had a PCN reaction causing immediate rash, facial/tongue/throat swelling, SOB or lightheadedness with hypotension: unknown Has patient had a PCN reaction causing severe rash involving mucus membranes or skin necrosis: unknown Has patient had a PCN reaction that required hospitalization: unknown Has patient had a PCN reaction occurring within the last 10 years: unknown If all of the above answers are "NO", then may proceed with Cephalosporin use. Prior course of rocephin charted 05/2015    Patient Measurements: Height: 5\' 2"  (157.5 cm) Weight: 212 lb 4.9 oz (96.3 kg) IBW/kg (Calculated) : 50.1  Vital Signs: Temp: 99.1 F (37.3 C) (01/03 0400) Temp Source: Axillary (01/03 0400) BP: 166/89 mmHg (01/03 0800) Intake/Output from previous day: 01/02 0701 - 01/03 0700 In: 4680.5 [P.O.:240; I.V.:1740.5; IV Piggyback:1700] Out: 865 [Urine:865] Intake/Output from this shift: Total I/O In: 125 [I.V.:125] Out: -   Labs:  Recent Labs  07/04/15 1227 07/05/15 0355  WBC 9.4 7.1  HGB 12.0 10.9*  PLT 368 307  CREATININE 1.21* 0.95   Estimated Creatinine Clearance: 75 mL/min (by C-G formula based on Cr of 0.95). No results for input(s): VANCOTROUGH, VANCOPEAK, VANCORANDOM, GENTTROUGH, GENTPEAK, GENTRANDOM, TOBRATROUGH, TOBRAPEAK, TOBRARND, AMIKACINPEAK, AMIKACINTROU, AMIKACIN in the last 72 hours.   Microbiology: Recent Results (from the past 720 hour(s))  Culture, blood (routine x 2)     Status: None   Collection Time: 06/08/15  1:00 PM  Result Value Ref Range Status   Specimen Description BLOOD LEFT FOREARM  Final   Special Requests BOTTLES DRAWN AEROBIC ONLY 6ML  Final   Culture   Final    NO GROWTH 5 DAYS Performed at [facility]    Report  Status 06/13/2015 FINAL  Final  Culture, blood (routine x 2)     Status: None   Collection Time: 06/08/15  1:55 PM  Result Value Ref Range Status   Specimen Description BLOOD RIGHT ANTECUBITAL  Final   Special Requests BOTTLES DRAWN AEROBIC ONLY 8ML  Final   Culture   Final    NO GROWTH 5 DAYS Performed at [facility]    Report Status 06/13/2015 FINAL  Final  Urine culture     Status: None   Collection Time: 06/08/15  2:04 PM  Result Value Ref Range Status   Specimen Description URINE, CATHETERIZED  Final   Special Requests NONE  Final   Culture   Final    NO GROWTH 1 DAY Performed at [facility]    Report Status 06/09/2015 FINAL  Final  MRSA PCR Screening     Status: None   Collection Time: 07/04/15  5:15 PM  Result Value Ref Range Status   MRSA by PCR NEGATIVE NEGATIVE Final    Comment:        The GeneXpert MRSA Assay (FDA approved for NASAL specimens only), is one component of a comprehensive MRSA colonization surveillance program. It is not intended to diagnose MRSA infection nor to guide or monitor treatment for MRSA infections.     Medical History: Past Medical History  Diagnosis Date  . MS (multiple sclerosis) ([place])     a. Dx'd late 20's. b. Tx with Novantrone, Tysabri, Copaxone previously.  Marland Kitchen Hypertension   . Depression   . Glaucoma   . Cardiomyopathy ([place])  a.  Echo 04/29/12: Mild LVH, EF 20-25%, mild AI, moderate MR, moderate LAE, mild RAE, mild RVE, moderate TR, PASP 51, small pericardial effusion;   b. probably non-ischemic given multiple chemo-Tx agents used for MS and global LV dysfn on echo  . Chronic systolic heart failure (HCC)    Assessment: 10 yoF with hx progressive/relapsing MS, HTN, depression, cardiomyopathy, chronic HF, and recurrent UTIs (Enterococcus UTI 4/'16, 7/'16) presents to [facility] on 1/2 with agitation, found to be septic secondary to UTI.  U/A shows many bacteria (squamous epithelial cells 6-30), turbid appearance,  with large leukocytes and WBC.  Pharmacy consulted to start Vancomycin and Cefepime.  Allergies noted to sulfa (hives, SOB) and penicillins (hives, itching).  Ceftriaxone x 1 given in ED. Last documented weight = 99.9kg (06/12/15).    Anti-infectives 1/2 >> Ceftriaxone x1 1/2 >> Cefepime  >>  1/2 >> Vancomycin >>   Vitals/Labs WBC: WNL Tm24h: 99.1 Renal: SCr improved 0.95, CrCl 75 ml/min  Cultures 1/2 bloodx2: Collected  1/2 urine: Collected  1/2 influenza panel: neg  Previous vancomycin trough - 14 on 12/2 on Vanc 1500mg  IV q24h, SCr 0.8-1.1  Goal of Therapy:  Vancomycin trough level 15-20 mcg/ml  Eradication of infection  Plan: Based on updated weight and renal function:  Increase Vancomycin to 1g IV q12h  Increase Cefepime to 2g IV q12h  F/u renal fxn closely to adjust doses  F/u vanc level at C[facility] as warranted  F/u cultures, clinical course  Peggyann Juba, PharmD, BCPS Pager: [phone]  07/05/2015, 8:16 AM

## 2015-07-06 DIAGNOSIS — A499 Bacterial infection, unspecified: Secondary | ICD-10-CM | POA: Diagnosis present

## 2015-07-06 MED ORDER — ENSURE ENLIVE PO LIQD: 237.0000 mL | Freq: Two times a day (BID) | ORAL | Status: DC | PRN

## 2015-07-06 NOTE — Progress Notes (Deleted)
CRITICAL VALUE ALERT  Critical value received lactic acid  Date of notification:  07/06/2015  Time of notification:  1312  Critical value read back: yes  Nurse who received alert:  D Mateo Flow RN  MD notified (1st page):  devine  Time of first page:  51  MD notified (2nd page):  Time of second page:  Responding MD:    Time MD responded:
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
## 2015-07-06 NOTE — Clinical Social Work Note (Signed)
Clinical Social Work Assessment  Patient Details  Name: Shannon Garrett MRN: [id number] Date of Birth: Sep 25, 1960  Date of referral:  07/06/15               Reason for consult:  Facility Placement                Permission sought to share information with:  Facility Art therapist granted to share information::  Yes, Verbal Permission Granted  Name::        Agency::     Relationship::     Contact Information:     Housing/Transportation Living arrangements for the past 2 months:  [place] of Information:  Patient (sister, [name] via phone) Patient Interpreter Needed:  None Criminal Activity/Legal Involvement Pertinent to Current Situation/Hospitalization:  No - Comment as needed Significant Relationships:  Siblings Lives with:  Facility Resident Do you feel safe going back to the place where you live?  Yes Need for family participation in patient care:  Yes (Comment)  Care giving concerns:  CSW received consult that patient was admitted from SNF.    Social Worker assessment / plan:  CSW confirmed with patient & sister, [name] (cell#: [phone]) that patient is from [facility] and plan is to return there at discharge.   Employment status:  Retired Nurse, adult, Medicaid In [place] PT Recommendations:  Not assessed at this time [place] / Referral to community resources:     Patient/Family's Response to care:  Patient states that she is pleased with the care she receives at [facility].   Patient/Family's Understanding of and Emotional Response to Diagnosis, Current Treatment, and Prognosis:    Emotional Assessment Appearance:  Appears stated age Attitude/Demeanor/Rapport:    Affect (typically observed):    Orientation:  Oriented to Self, Oriented to Place, Oriented to  Time Alcohol / Substance use:    Psych involvement (Current and /or in the community):     Discharge Needs  Concerns to be addressed:     Readmission within the last 30 days:    Current discharge risk:    Barriers to Discharge:      Standley Brooking, LCSW 07/06/2015, 10:06 AM

## 2015-07-06 NOTE — Progress Notes (Signed)
Assessed and transferred to 1613. Report called to nurse. RN transferred patient to the room.

## 2015-07-06 NOTE — NC FL2 (Signed)
[place] LEVEL OF CARE SCREENING TOOL     IDENTIFICATION  Patient Name: Shannon Garrett Birthdate: 27-Sep-1960 Sex: female Admission Date (Current Location): 07/04/2015  [facility] and [place] Number:  Herbalist and Address:  [facility],  [place] [address] [place]      Provider Number: [id number]  Attending Physician Name and Address:  Robbie Lis, MD  Relative Name and Phone Number:       Current Level of Care: Hospital Recommended Level of Care: [place] Prior Approval Number:    Date Approved/Denied:   PASRR Number:  ([id number])  Discharge Plan: SNF    Current Diagnoses: Patient Active Problem List   Diagnosis Date Noted  . Sepsis ([place]) 07/04/2015  . Vitamin D deficiency 06/18/2015  . Multiple sclerosis exacerbation ([place]) 06/08/2015  . Weakness 06/08/2015  . Chronic systolic (congestive) heart failure ([place]) 06/08/2015  . Acute encephalopathy 06/03/2015  . Acute kidney injury ([place]) 06/03/2015  . Hypokalemia 06/03/2015  . Sepsis secondary to UTI ([place]) 05/30/2015  . Hypernatremia 05/30/2015  . Dehydration 05/30/2015  . Chronic anemia 05/30/2015  . Chronic pain syndrome 03/03/2015  . Benign hypertensive heart disease without heart failure 01/18/2015  . Urinary tract infection due to Enterococcus   . Essential hypertension   . Abnormal LFTs   . Foot drop, bilateral   . Pressure ulcer 01/12/2015  . UTI (lower urinary tract infection) 01/11/2015  . Hypovolemia 01/11/2015  . UTI (urinary tract infection), uncomplicated   . Cardiomyopathy ([place])   . Urinary incontinence 01/07/2015  . Chronic constipation 11/26/2014  . Depression 11/26/2014  . Tachycardia 02/20/2014  . Venous stasis ulcers ([place]) 02/20/2014  . Leg wound, left 11/04/2013  . FTT (failure to thrive) in adult 07/31/2013  . Medically noncompliant 07/31/2013  . Blisters of multiple sites 07/31/2013  . Generalized weakness 06/03/2013  .  Insomnia 06/03/2013  . Neurogenic bladder 06/03/2013  . DS (disseminated sclerosis) ([place]) 02/12/2013  . Chronic diastolic heart failure ([place]) 09/29/2012  . Elevated CK 09/10/2012  . Severe malnutrition ([place]) 09/05/2012  . Paraplegia ([place]) 09/05/2012  . Fall 09/04/2012  . Chronic combined systolic and diastolic congestive heart failure ([place]) 05/22/2012  . Other primary cardiomyopathies 05/01/2012  . Immobility 04/28/2012  . OBESITY 11/30/2008  . ANEMIA, CHRONIC 11/30/2008  . MULTIPLE SCLEROSIS, PROGRESSIVE/RELAPSING 11/30/2008  . Hypertensive heart disease with CHF (congestive heart failure) ([place]) 11/30/2008  . ECZEMA 11/30/2008    Orientation RESPIRATION BLADDER Height & Weight    Self, Place  Normal Incontinent, Indwelling catheter 5\' 2"  (157.5 cm) 220 lbs.  BEHAVIORAL SYMPTOMS/MOOD NEUROLOGICAL BOWEL NUTRITION STATUS      Continent Diet (Heart)  AMBULATORY STATUS COMMUNICATION OF NEEDS Skin   Extensive Assist Verbally Normal                       Personal Care Assistance Level of Assistance  Bathing, Feeding, Dressing Bathing Assistance: Limited assistance Feeding assistance: Limited assistance Dressing Assistance: Limited assistance     Functional Limitations Info             SPECIAL CARE FACTORS FREQUENCY                       Contractures      Additional Factors Info  Code Status, Allergies Code Status Info: Fullcode Allergies Info: Allergies:  Sulfonamide Derivatives, Penicillins           Current Medications (07/06/2015):  This is the current hospital active medication list Current Facility-Administered Medications  Medication Dose Route Frequency Provider Last Rate Last Dose  . acetaminophen (TYLENOL) tablet 650 mg  650 mg Oral Q6H PRN Nishant Dhungel, MD   650 mg at 07/05/15 2214   Or  . acetaminophen (TYLENOL) suppository 650 mg  650 mg Rectal Q6H PRN Nishant Dhungel, MD      . amantadine (SYMMETREL) capsule 100 mg  100 mg Oral Daily  Nishant Dhungel, MD   100 mg at 07/06/15 0920  . carvedilol (COREG) tablet 12.5 mg  12.5 mg Oral BID WC Nishant Dhungel, MD   12.5 mg at 07/06/15 0802  . ceFEPIme (MAXIPIME) 2 g in dextrose 5 % 50 mL IVPB  2 g Intravenous Q12H Emiliano Dyer, RPH   2 g at 07/06/15 [id number]  . enoxaparin (LOVENOX) injection 40 mg  40 mg Subcutaneous Q24H Nishant Dhungel, MD   40 mg at 07/05/15 1807  . furosemide (LASIX) tablet 80 mg  80 mg Oral BID Nishant Dhungel, MD   80 mg at 07/06/15 0802  . latanoprost (XALATAN) 0.005 % ophthalmic solution 1 drop  1 drop Both Eyes QHS Nishant Dhungel, MD   1 drop at 07/05/15 2207  . LORazepam (ATIVAN) injection 1 mg  1 mg Intravenous Q6H PRN Nishant Dhungel, MD      . ondansetron (ZOFRAN) tablet 4 mg  4 mg Oral Q6H PRN Nishant Dhungel, MD       Or  . ondansetron (ZOFRAN) injection 4 mg  4 mg Intravenous Q6H PRN Nishant Dhungel, MD      . sodium chloride 0.9 % injection 3 mL  3 mL Intravenous Q12H Nishant Dhungel, MD   3 mL at 07/04/15 2231  . spironolactone (ALDACTONE) tablet 25 mg  25 mg Oral Daily Nishant Dhungel, MD   25 mg at 07/06/15 0920  . tamsulosin (FLOMAX) capsule 0.4 mg  0.4 mg Oral Daily Nishant Dhungel, MD   0.4 mg at 07/06/15 0920  . vancomycin (VANCOCIN) IVPB 1000 mg/200 mL premix  1,000 mg Intravenous Q12H Emiliano Dyer, RPH   1,000 mg at 07/06/15 [id number]     Discharge Medications: Please see discharge summary for a list of discharge medications.  Relevant Imaging Results:  Relevant Lab Results:   Additional Information SS # [id number]  Standley Brooking, LCSW
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
## 2015-07-07 DIAGNOSIS — A4159 Other Gram-negative sepsis: Secondary | ICD-10-CM

## 2015-07-07 LAB — URINE CULTURE

## 2015-07-07 MED ORDER — ACETAMINOPHEN 325 MG PO TABS: 650.0000 mg | ORAL_TABLET | Freq: Four times a day (QID) | ORAL | Status: DC | PRN

## 2015-07-07 MED ORDER — CIPROFLOXACIN HCL 500 MG PO TABS: 500.0000 mg | ORAL_TABLET | Freq: Two times a day (BID) | ORAL | Status: DC

## 2015-07-07 MED ORDER — ENSURE ENLIVE PO LIQD: 237.0000 mL | Freq: Two times a day (BID) | ORAL | Status: DC | PRN

## 2015-07-07 MED ORDER — GABAPENTIN 300 MG PO CAPS: 900.0000 mg | ORAL_CAPSULE | Freq: Three times a day (TID) | ORAL | Status: DC

## 2015-07-07 MED ORDER — ONDANSETRON HCL 4 MG PO TABS: 4.0000 mg | ORAL_TABLET | Freq: Four times a day (QID) | ORAL | Status: DC | PRN

## 2015-07-07 NOTE — Evaluation (Signed)
Physical Therapy Evaluation Patient Details Name: Shannon Garrett MRN: [id number] DOB: April 20, 1961 Today's Date: 07/07/2015   History of Present Illness  Shannon Garrett is a 55 y.o. female with a past medical history of advanced multiple sclerosis, hypertension, depression, glaucoma, systolic CHF, chronic pain syndrome who is brought to the hospital from [place] due to UTI, AMS, sepsis. Pt has had multiple recent UTIs.  At baseline, the patient is usually nonambulatory due to lower extremity weakness (mechanical lifts are used to get pt into WC daily, she's not able to propel WC due to LUE weakness), but awake alert oriented 3 and able to talk and interact with the facilities personnel and relatives.   Clinical Impression  Pt admitted with above diagnosis. Pt currently with functional limitations due to the deficits listed below (see PT Problem List). +2 total assist for bed mobility. Performed BLE ROM exercises in bed and worked on sitting balance at EOB where pt sat for 20 min with supervision to max A for balance. Attempted mechanical lift for transfer to chair however multiple batteries failed, nursing notified and stated they'd request repair.  Pt will benefit from skilled PT to increase their independence and safety with mobility to allow discharge to the venue listed below.       Follow Up Recommendations SNF;Supervision/Assistance - 24 hour    Equipment Recommendations  None recommended by PT    Recommendations for Other Services       Precautions / Restrictions Precautions Precautions: Fall Restrictions Weight Bearing Restrictions: No      Mobility  Bed Mobility Overal bed mobility: +2 for physical assistance;Needs Assistance Bed Mobility: Rolling;Sit to Supine;Supine to Sit Rolling: +2 for physical assistance;Total assist   Supine to sit: +2 for physical assistance;Total assist Sit to supine: +2 for physical assistance;Total assist   General bed mobility  comments: pt 5%; assist for trunk and BLEs  Transfers                 General transfer comment: attempted bed to recliner transfer with [place], however multiple batteries failed  Ambulation/Gait                Stairs            Wheelchair Mobility    Modified Rankin (Stroke Patients Only)       Balance Overall balance assessment: Needs assistance Sitting-balance support: Bilateral upper extremity supported;Feet supported Sitting balance-Leahy Scale: Zero Sitting balance - Comments: pt sat on EOB x 20 min, at times required max assist due to posterior LOB, at times was able to maintain neutral; performed lateral and ant/posterior weight shifts in sitting and reaching forward over BOS with RUE                                     Pertinent Vitals/Pain Pain Assessment: No/denies pain    Home Living Family/patient expects to be discharged to:: Skilled nursing facility                      Prior Function Level of Independence: Needs assistance   Gait / Transfers Assistance Needed: Using stand up lift with 2 person assist for transfers into w/c.  Unable to propel w/c due to LUE weakness.     Comments: Pt reports her w/c has lateral supports on R side due to trunk weakness.     Hand Dominance  Extremity/Trunk Assessment   Upper Extremity Assessment: RUE deficits/detail;LUE deficits/detail RUE Deficits / Details: shoulder elevation AAROM 90*, elbow +3/5, grip -4/5     LUE Deficits / Details: shoulder elevation AAROM 50*, elbow +2/5, grip 3/5   Lower Extremity Assessment: RLE deficits/detail;LLE deficits/detail RLE Deficits / Details: ankle DF PROM 0*, quad set -2/5, hip ABD/flexion -2/5 LLE Deficits / Details: foot drop, ankle DF PROM -10*, no AROM in ankle, 1/5 quad set, hip ABD/flexion +1/5  Cervical / Trunk Assessment: Kyphotic (poor trunk control sitting on EOB)  Communication   Communication: Expressive  difficulties (low volume)  Cognition Arousal/Alertness: Awake/alert Behavior During Therapy: WFL for tasks assessed/performed Overall Cognitive Status: Within Functional Limits for tasks assessed                      General Comments      Exercises General Exercises - Lower Extremity Ankle Circles/Pumps: PROM;Both;10 reps;Supine Quad Sets: AROM;Both;5 reps;Supine Long Arc Quad: PROM;Both;5 reps;Seated Heel Slides: AAROM;Both;10 reps;Supine Hip ABduction/ADduction: AAROM;Both;10 reps;Supine      Assessment/Plan    PT Assessment Patient needs continued PT services  PT Diagnosis Generalized weakness   PT Problem List Decreased strength;Decreased range of motion;Decreased activity tolerance;Decreased balance;Decreased mobility  PT Treatment Interventions Functional mobility training;Therapeutic activities;Therapeutic exercise   PT Goals (Current goals can be found in the Care Plan section) Acute Rehab PT Goals Patient Stated Goal: to be able to stand again, to get stronger PT Goal Formulation: With patient Time For Goal Achievement: 07/21/15 Potential to Achieve Goals: Fair    Frequency Min 2X/week   Barriers to discharge        Co-evaluation               End of Session Equipment Utilized During Treatment: Other (comment) (sara plus) Activity Tolerance: Patient tolerated treatment well Patient left: in bed;with call bell/phone within reach Nurse Communication: Mobility status;Need for lift equipment (need for functional batteries for lift equipment)         Time: [id number] PT Time Calculation (min) (ACUTE ONLY): 57 min   Charges:   PT Evaluation $PT Eval High Complexity: 1 Procedure PT Treatments $Therapeutic Exercise: 8-22 mins $Therapeutic Activity: 23-37 mins   PT G Codes:        Philomena Doheny 07/07/2015, 11:48 AM [phone]

## 2015-07-07 NOTE — Care Management Important Message (Signed)
Important Message  Patient Details  Name: Domini Dhillon MRN: [id number] Date of Birth: [birth year]   Medicare Important Message Given:  Yes, No voiced questions or concerns.     Delrae Sawyers, RN 07/07/2015, 11:54 AM

## 2015-07-07 NOTE — Discharge Instructions (Signed)
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
## 2015-07-07 NOTE — Care Management Note (Signed)
Case Management Note  Patient Details  Name: Shannon Garrett MRN: [id number] Date of Birth: 18-Nov-1960  Subjective/Objective: 55 y.o. Admitted 07/04/2015 with sepsis From SNF. Marland Kitchen Anticipate discharge back to [facility]. Will defer discharge planning to [place].                    Action/Plan:CM will continue to follow for Discharge/Disposition needs.   Expected Discharge Date:   (unknown)               Expected Discharge Plan:  Skilled Nursing Facility  In-House Referral:  NA, Clinical Social Work  Discharge planning Services  CM Consult  Post Acute Care Choice:    Choice offered to:     DME Arranged:    DME Agency:     HH Arranged:    [place] Agency:     Status of Service:  In process, will continue to follow  Medicare Important Message Given:    Date Medicare IM Given:    Medicare IM give by:    Date Additional Medicare IM Given:    Additional Medicare Important Message give by:     If discussed at [place] of Stay Meetings, dates discussed:    Additional Comments:  Delrae Sawyers, RN 07/07/2015, 8:25 AM

## 2015-07-07 NOTE — Care Management Important Message (Signed)
Important Message  Patient Details IM Letter given to Jeannie/Case Manager to present to Patient Name: Sandar Rodenbeck MRN: [id number] Date of Birth: 1960-08-15   Medicare Important Message Given:  Yes    Camillo Flaming 07/07/2015, 2:02 [place] Message  Patient Details  Name: Toneisha Stranger MRN: [id number] Date of Birth: 12-May-1961   Medicare Important Message Given:  Yes    Camillo Flaming 07/07/2015, 2:01 PM
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
## 2015-07-08 NOTE — Progress Notes (Signed)
Paged MD about foley catheter and status of catheter staying or being d/ced at her discharge to facility. MD verbalized that she wanted to have the catheter stay in place at discharge.

## 2015-07-08 NOTE — Progress Notes (Signed)
Report called to facility. Update given on pt as this facility had her prior to her admission to hospital. All questions answered. Pt leaving with [facility] at this time. Pt alert and in NAD at time of discharge.

## 2015-07-08 NOTE — Progress Notes (Signed)
Pt / sister are in agreement with d/c to [facility] today. [facility] transport is needed. Pt / sister are aware out of pocket costs may be associated with [facility] transport. NSG reviewed d/c summary, scripts, avs. Scripts included in d/c packet. D/C summary sent to SNF for review prior to d/c.  Werner Lean LCSW [phone]

## 2015-07-08 NOTE — Progress Notes (Signed)
Pt stable for discharge to SNF today No changes in medical management since 07/07/2015 Please refer to discharge summary completed 07/07/2015  Leisa Lenz [facility] [id number]

## 2015-07-09 ENCOUNTER — Encounter: Payer: Self-pay | Admitting: Internal Medicine

## 2015-07-09 LAB — CULTURE, BLOOD (ROUTINE X 2)
CULTURE: NO GROWTH
Culture: NO GROWTH

## 2015-07-10 ENCOUNTER — Emergency Department (HOSPITAL_COMMUNITY): Payer: Medicare Other

## 2015-07-10 ENCOUNTER — Encounter (HOSPITAL_COMMUNITY): Payer: Self-pay

## 2015-07-10 ENCOUNTER — Inpatient Hospital Stay (HOSPITAL_COMMUNITY)
Admission: EM | Admit: 2015-07-10 | Discharge: 2015-07-25 | DRG: 207 | Disposition: A | Discharge status: Skilled Nursing Facility | Payer: Medicare Other | Attending: Internal Medicine | Admitting: Internal Medicine

## 2015-07-10 DIAGNOSIS — R4182 Altered mental status, unspecified: Secondary | ICD-10-CM | POA: Insufficient documentation

## 2015-07-10 DIAGNOSIS — G8254 Quadriplegia, C5-C7 incomplete: Secondary | ICD-10-CM | POA: Diagnosis present

## 2015-07-10 DIAGNOSIS — Z79899 Other long term (current) drug therapy: Secondary | ICD-10-CM | POA: Diagnosis not present

## 2015-07-10 DIAGNOSIS — G9340 Encephalopathy, unspecified: Secondary | ICD-10-CM | POA: Diagnosis not present

## 2015-07-10 DIAGNOSIS — E870 Hyperosmolality and hypernatremia: Secondary | ICD-10-CM | POA: Diagnosis not present

## 2015-07-10 DIAGNOSIS — E876 Hypokalemia: Secondary | ICD-10-CM | POA: Diagnosis not present

## 2015-07-10 DIAGNOSIS — I119 Hypertensive heart disease without heart failure: Secondary | ICD-10-CM | POA: Diagnosis present

## 2015-07-10 DIAGNOSIS — H409 Unspecified glaucoma: Secondary | ICD-10-CM | POA: Diagnosis present

## 2015-07-10 DIAGNOSIS — N179 Acute kidney failure, unspecified: Secondary | ICD-10-CM | POA: Diagnosis present

## 2015-07-10 DIAGNOSIS — E2740 Unspecified adrenocortical insufficiency: Secondary | ICD-10-CM | POA: Diagnosis present

## 2015-07-10 DIAGNOSIS — J9602 Acute respiratory failure with hypercapnia: Secondary | ICD-10-CM

## 2015-07-10 DIAGNOSIS — R739 Hyperglycemia, unspecified: Secondary | ICD-10-CM | POA: Diagnosis present

## 2015-07-10 DIAGNOSIS — F329 Major depressive disorder, single episode, unspecified: Secondary | ICD-10-CM | POA: Diagnosis present

## 2015-07-10 DIAGNOSIS — Z978 Presence of other specified devices: Secondary | ICD-10-CM

## 2015-07-10 DIAGNOSIS — Z820 Family history of epilepsy and other diseases of the nervous system: Secondary | ICD-10-CM | POA: Diagnosis not present

## 2015-07-10 DIAGNOSIS — Z87440 Personal history of urinary (tract) infections: Secondary | ICD-10-CM | POA: Diagnosis not present

## 2015-07-10 DIAGNOSIS — G35 Multiple sclerosis: Secondary | ICD-10-CM | POA: Diagnosis present

## 2015-07-10 DIAGNOSIS — E669 Obesity, unspecified: Secondary | ICD-10-CM | POA: Diagnosis present

## 2015-07-10 DIAGNOSIS — N319 Neuromuscular dysfunction of bladder, unspecified: Secondary | ICD-10-CM | POA: Diagnosis present

## 2015-07-10 DIAGNOSIS — Z6836 Body mass index (BMI) 36.0-36.9, adult: Secondary | ICD-10-CM | POA: Diagnosis not present

## 2015-07-10 DIAGNOSIS — I5022 Chronic systolic (congestive) heart failure: Secondary | ICD-10-CM | POA: Diagnosis present

## 2015-07-10 DIAGNOSIS — Z882 Allergy status to sulfonamides status: Secondary | ICD-10-CM

## 2015-07-10 DIAGNOSIS — Z66 Do not resuscitate: Secondary | ICD-10-CM | POA: Diagnosis present

## 2015-07-10 DIAGNOSIS — R531 Weakness: Secondary | ICD-10-CM | POA: Diagnosis not present

## 2015-07-10 DIAGNOSIS — K5909 Other constipation: Secondary | ICD-10-CM

## 2015-07-10 DIAGNOSIS — Z87891 Personal history of nicotine dependence: Secondary | ICD-10-CM

## 2015-07-10 DIAGNOSIS — N390 Urinary tract infection, site not specified: Secondary | ICD-10-CM

## 2015-07-10 DIAGNOSIS — Z8249 Family history of ischemic heart disease and other diseases of the circulatory system: Secondary | ICD-10-CM | POA: Diagnosis not present

## 2015-07-10 DIAGNOSIS — Z880 Allergy status to penicillin: Secondary | ICD-10-CM | POA: Diagnosis not present

## 2015-07-10 DIAGNOSIS — J9600 Acute respiratory failure, unspecified whether with hypoxia or hypercapnia: Secondary | ICD-10-CM

## 2015-07-10 DIAGNOSIS — J9601 Acute respiratory failure with hypoxia: Secondary | ICD-10-CM | POA: Diagnosis present

## 2015-07-10 DIAGNOSIS — R1310 Dysphagia, unspecified: Secondary | ICD-10-CM | POA: Diagnosis present

## 2015-07-10 LAB — HEPATIC FUNCTION PANEL
ALBUMIN: 3.6 g/dL (ref 3.5–5.0)
ALT: 31 U/L (ref 14–54)
AST: 17 U/L (ref 15–41)
Alkaline Phosphatase: 166 U/L — ABNORMAL HIGH (ref 38–126)
Bilirubin, Direct: 0.1 mg/dL (ref 0.1–0.5)
Indirect Bilirubin: 0.5 mg/dL (ref 0.3–0.9)
Total Bilirubin: 0.6 mg/dL (ref 0.3–1.2)
Total Protein: 7.5 g/dL (ref 6.5–8.1)

## 2015-07-10 LAB — BASIC METABOLIC PANEL
ANION GAP: 13 (ref 5–15)
BUN: 31 mg/dL — ABNORMAL HIGH (ref 6–20)
CO2: 27 mmol/L (ref 22–32)
Calcium: 9.7 mg/dL (ref 8.9–10.3)
Chloride: 106 mmol/L (ref 101–111)
Creatinine, Ser: 1.84 mg/dL — ABNORMAL HIGH (ref 0.44–1.00)
GFR calc Af Amer: 35 mL/min — ABNORMAL LOW (ref >60)
GFR calc non Af Amer: 30 mL/min — ABNORMAL LOW (ref >60)
GLUCOSE: 130 mg/dL — AB (ref 65–99)
POTASSIUM: 3.5 mmol/L (ref 3.5–5.1)
Sodium: 146 mmol/L — ABNORMAL HIGH (ref 135–145)

## 2015-07-10 LAB — I-STAT ARTERIAL BLOOD GAS, ED
ACID-BASE EXCESS: 2 mmol/L (ref 0.0–2.0)
Acid-Base Excess: 3 mmol/L — ABNORMAL HIGH (ref 0.0–2.0)
Bicarbonate: 30.3 mEq/L — ABNORMAL HIGH (ref 20.0–24.0)
Bicarbonate: 26.7 mEq/L — ABNORMAL HIGH (ref 20.0–24.0)
O2 SAT: 100 %
O2 Saturation: 100 %
PCO2 ART: 56.7 mmHg — AB (ref 35.0–45.0)
PH ART: 7.336 — AB (ref 7.350–7.450)
PH ART: 7.42 (ref 7.350–7.450)
PO2 ART: 412 mmHg — AB (ref 80.0–100.0)
Patient temperature: 98.6
TCO2: 32 mmol/L (ref 0–100)
TCO2: 28 mmol/L (ref 0–100)
pCO2 arterial: 41.2 mmHg (ref 35.0–45.0)
pO2, Arterial: 417 mmHg — ABNORMAL HIGH (ref 80.0–100.0)

## 2015-07-10 LAB — CK: CK TOTAL: 91 U/L (ref 38–234)

## 2015-07-10 LAB — URINALYSIS, ROUTINE W REFLEX MICROSCOPIC
BILIRUBIN URINE: NEGATIVE
GLUCOSE, UA: NEGATIVE mg/dL
Hgb urine dipstick: NEGATIVE
KETONES UR: NEGATIVE mg/dL
Nitrite: NEGATIVE
PH: 5 (ref 5.0–8.0)
Protein, ur: NEGATIVE mg/dL
Specific Gravity, Urine: 1.015 (ref 1.005–1.030)

## 2015-07-10 LAB — CBC WITH DIFFERENTIAL/PLATELET
Basophils Absolute: 0.1 10*3/uL (ref 0.0–0.1)
Basophils Relative: 1 %
Eosinophils Absolute: 0.2 10*3/uL (ref 0.0–0.7)
Eosinophils Relative: 4 %
HEMATOCRIT: 36 % (ref 36.0–46.0)
Hemoglobin: 11.7 g/dL — ABNORMAL LOW (ref 12.0–15.0)
LYMPHS PCT: 25 %
Lymphs Abs: 1.5 10*3/uL (ref 0.7–4.0)
MCH: 26.8 pg (ref 26.0–34.0)
MCHC: 32.5 g/dL (ref 30.0–36.0)
MCV: 82.6 fL (ref 78.0–100.0)
MONO ABS: 0.5 10*3/uL (ref 0.1–1.0)
MONOS PCT: 9 %
NEUTROS ABS: 3.8 10*3/uL (ref 1.7–7.7)
Neutrophils Relative %: 61 %
Platelets: 276 10*3/uL (ref 150–400)
RBC: 4.36 MIL/uL (ref 3.87–5.11)
RDW: 16.1 % — AB (ref 11.5–15.5)
WBC: 6.1 10*3/uL (ref 4.0–10.5)

## 2015-07-10 LAB — RAPID URINE DRUG SCREEN, HOSP PERFORMED
Amphetamines: NOT DETECTED
Barbiturates: NOT DETECTED
Benzodiazepines: NOT DETECTED
COCAINE: NOT DETECTED
OPIATES: NOT DETECTED
Tetrahydrocannabinol: NOT DETECTED

## 2015-07-10 LAB — URINE MICROSCOPIC-ADD ON

## 2015-07-10 LAB — PROCALCITONIN

## 2015-07-10 LAB — ETHANOL: Alcohol, Ethyl (B): <5 mg/dL (ref <5)

## 2015-07-10 LAB — VITAMIN B12: Vitamin B-12: 2449 pg/mL — ABNORMAL HIGH (ref 180–914)

## 2015-07-10 LAB — TSH: TSH: 0.557 u[IU]/mL (ref 0.350–4.500)

## 2015-07-10 LAB — AMMONIA: Ammonia: 24 umol/L (ref 9–35)

## 2015-07-10 LAB — LACTIC ACID, PLASMA
LACTIC ACID, VENOUS: 1.1 mmol/L (ref 0.5–2.0)
Lactic Acid, Venous: 0.7 mmol/L (ref 0.5–2.0)

## 2015-07-10 LAB — CORTISOL: Cortisol, Plasma: 7.6 ug/dL

## 2015-07-10 MED ORDER — FAMOTIDINE IN NACL 20-0.9 MG/50ML-% IV SOLN
20.0000 mg | Freq: Two times a day (BID) | INTRAVENOUS | Status: DC
  Administered 2015-07-10 – 2015-07-20 (×21): 20 mg via INTRAVENOUS
  Filled 2015-07-10 (×20): qty 50

## 2015-07-10 MED ORDER — ANTISEPTIC ORAL RINSE SOLUTION (CORINZ)
7.0000 mL | OROMUCOSAL | Status: DC
  Administered 2015-07-10 – 2015-07-19 (×90): 7 mL via OROMUCOSAL

## 2015-07-10 MED ORDER — DEXAMETHASONE SODIUM PHOSPHATE 4 MG/ML IJ SOLN
4.0000 mg | Freq: Four times a day (QID) | INTRAMUSCULAR | Status: DC
  Administered 2015-07-10 – 2015-07-12 (×5): 4 mg via INTRAVENOUS
  Filled 2015-07-10 (×10): qty 1

## 2015-07-10 MED ORDER — VANCOMYCIN HCL IN DEXTROSE 1-5 GM/200ML-% IV SOLN
1000.0000 mg | Freq: Once | INTRAVENOUS | Status: AC
  Administered 2015-07-10: 1000 mg via INTRAVENOUS
  Filled 2015-07-10: qty 200

## 2015-07-10 MED ORDER — PHENYTOIN SODIUM 50 MG/ML IJ SOLN
100.0000 mg | Freq: Three times a day (TID) | INTRAMUSCULAR | Status: DC
  Administered 2015-07-11 – 2015-07-17 (×18): 100 mg via INTRAVENOUS
  Filled 2015-07-10 (×18): qty 2

## 2015-07-10 MED ORDER — SODIUM CHLORIDE 0.9 % IV BOLUS (SEPSIS)
500.0000 mL | Freq: Once | INTRAVENOUS | Status: AC
  Administered 2015-07-10: 500 mL via INTRAVENOUS

## 2015-07-10 MED ORDER — SODIUM CHLORIDE 0.9 % IV SOLN
INTRAVENOUS | Status: DC
  Administered 2015-07-10: 16:00:00 via INTRAVENOUS
  Administered 2015-07-12: 100 mL/h via INTRAVENOUS
  Administered 2015-07-15: 09:00:00 via INTRAVENOUS

## 2015-07-10 MED ORDER — SODIUM CHLORIDE 0.9 % IV SOLN
500.0000 mg | INTRAVENOUS | Status: AC
  Administered 2015-07-10: 500 mg via INTRAVENOUS
  Filled 2015-07-10 (×2): qty 5

## 2015-07-10 MED ORDER — LATANOPROST 0.005 % OP SOLN
1.0000 [drp] | Freq: Every day | OPHTHALMIC | Status: DC
  Administered 2015-07-10 – 2015-07-24 (×15): 1 [drp] via OPHTHALMIC
  Filled 2015-07-10 (×2): qty 2.5

## 2015-07-10 MED ORDER — HEPARIN SODIUM (PORCINE) 5000 UNIT/ML IJ SOLN
5000.0000 [IU] | Freq: Three times a day (TID) | INTRAMUSCULAR | Status: DC
  Administered 2015-07-10 – 2015-07-11 (×2): 5000 [IU] via SUBCUTANEOUS
  Filled 2015-07-10 (×2): qty 1

## 2015-07-10 MED ORDER — ETOMIDATE 2 MG/ML IV SOLN
INTRAVENOUS | Status: AC | PRN
  Administered 2015-07-10: 20 mg via INTRAVENOUS

## 2015-07-10 MED ORDER — DOCUSATE SODIUM 100 MG PO CAPS
100.0000 mg | ORAL_CAPSULE | Freq: Two times a day (BID) | ORAL | Status: DC
  Administered 2015-07-10 – 2015-07-25 (×16): 100 mg via ORAL
  Filled 2015-07-10 (×17): qty 1

## 2015-07-10 MED ORDER — VANCOMYCIN HCL IN DEXTROSE 750-5 MG/150ML-% IV SOLN
750.0000 mg | Freq: Two times a day (BID) | INTRAVENOUS | Status: DC
  Administered 2015-07-11 – 2015-07-12 (×3): 750 mg via INTRAVENOUS
  Filled 2015-07-10 (×3): qty 150

## 2015-07-10 MED ORDER — FENTANYL CITRATE (PF) 100 MCG/2ML IJ SOLN
100.0000 ug | INTRAMUSCULAR | Status: DC | PRN
  Administered 2015-07-12: 50 ug via INTRAVENOUS
  Administered 2015-07-12: 100 ug via INTRAVENOUS
  Administered 2015-07-12: 25 ug via INTRAVENOUS
  Administered 2015-07-13 – 2015-07-18 (×17): 100 ug via INTRAVENOUS
  Filled 2015-07-10 (×19): qty 2

## 2015-07-10 MED ORDER — DEXTROSE 5 % IV SOLN
10.0000 mg/kg | Freq: Three times a day (TID) | INTRAVENOUS | Status: DC
Start: 2015-07-10 — End: 2015-07-11
  Administered 2015-07-10 – 2015-07-11 (×4): 500 mg via INTRAVENOUS
  Filled 2015-07-10 (×6): qty 10

## 2015-07-10 MED ORDER — SUCCINYLCHOLINE CHLORIDE 20 MG/ML IJ SOLN
INTRAMUSCULAR | Status: AC | PRN
  Administered 2015-07-10: 150 mg via INTRAVENOUS

## 2015-07-10 MED ORDER — BACLOFEN 20 MG PO TABS
20.0000 mg | ORAL_TABLET | Freq: Three times a day (TID) | ORAL | Status: DC
  Administered 2015-07-10 – 2015-07-11 (×4): 20 mg via ORAL
  Filled 2015-07-10 (×4): qty 1

## 2015-07-10 MED ORDER — POLYVINYL ALCOHOL 1.4 % OP SOLN
1.0000 [drp] | Freq: Two times a day (BID) | OPHTHALMIC | Status: DC
  Administered 2015-07-10 – 2015-07-25 (×29): 1 [drp] via OPHTHALMIC
  Filled 2015-07-10 (×2): qty 15

## 2015-07-10 MED ORDER — FENTANYL CITRATE (PF) 100 MCG/2ML IJ SOLN
100.0000 ug | INTRAMUSCULAR | Status: DC | PRN
  Administered 2015-07-13: 100 ug via INTRAVENOUS
  Filled 2015-07-10 (×2): qty 2

## 2015-07-10 MED ORDER — PROPOFOL 1000 MG/100ML IV EMUL
5.0000 ug/kg/min | INTRAVENOUS | Status: DC
  Administered 2015-07-10: 20 ug/kg/min via INTRAVENOUS

## 2015-07-10 MED ORDER — DEXTROSE 5 % IV SOLN
1.0000 g | Freq: Once | INTRAVENOUS | Status: AC
  Administered 2015-07-10: 1 g via INTRAVENOUS
  Filled 2015-07-10: qty 10

## 2015-07-10 MED ORDER — SODIUM CHLORIDE 0.9 % IV SOLN: 100.0000 mg | Freq: Three times a day (TID) | INTRAVENOUS | Status: DC

## 2015-07-10 MED ORDER — DEXTROSE 5 % IV SOLN
2.0000 g | Freq: Two times a day (BID) | INTRAVENOUS | Status: DC
  Administered 2015-07-10 – 2015-07-11 (×3): 2 g via INTRAVENOUS
  Filled 2015-07-10 (×4): qty 2

## 2015-07-10 MED ORDER — ACYCLOVIR SODIUM 50 MG/ML IV SOLN: 10.0000 mg/kg | Freq: Three times a day (TID) | INTRAVENOUS | Status: DC

## 2015-07-10 MED ORDER — SODIUM CHLORIDE 0.9 % IV SOLN
1500.0000 mg | Freq: Once | INTRAVENOUS | Status: AC
  Administered 2015-07-10: 1500 mg via INTRAVENOUS
  Filled 2015-07-10: qty 30

## 2015-07-10 MED ORDER — DEXAMETHASONE SODIUM PHOSPHATE 10 MG/ML IJ SOLN
10.0000 mg | Freq: Once | INTRAMUSCULAR | Status: AC
  Administered 2015-07-10: 10 mg via INTRAVENOUS
  Filled 2015-07-10: qty 1

## 2015-07-10 MED ORDER — CHLORHEXIDINE GLUCONATE 0.12% ORAL RINSE (MEDLINE KIT)
15.0000 mL | Freq: Two times a day (BID) | OROMUCOSAL | Status: DC
  Administered 2015-07-10 – 2015-07-25 (×28): 15 mL via OROMUCOSAL

## 2015-07-10 MED ORDER — CARVEDILOL 12.5 MG PO TABS
12.5000 mg | ORAL_TABLET | Freq: Two times a day (BID) | ORAL | Status: DC
  Administered 2015-07-11 (×2): 12.5 mg via ORAL
  Filled 2015-07-10 (×2): qty 1

## 2015-07-10 MED ORDER — PROPOFOL 1000 MG/100ML IV EMUL
INTRAVENOUS | Status: AC
  Filled 2015-07-10: qty 100

## 2015-07-10 MED ORDER — DARIFENACIN HYDROBROMIDE ER 15 MG PO TB24
15.0000 mg | ORAL_TABLET | Freq: Every day | ORAL | Status: DC
  Administered 2015-07-11 – 2015-07-25 (×12): 15 mg via ORAL
  Filled 2015-07-10 (×16): qty 1

## 2015-07-10 MED ORDER — SODIUM CHLORIDE 0.9 % IV SOLN: 250.0000 mL | INTRAVENOUS | Status: DC | PRN

## 2015-07-10 NOTE — ED Notes (Signed)
Pt.s daughters father, Starling Manns, Lives in [place] and pt.s daughter lives in [place] [phone].  Dr. Stark Jock spoke with Gerald Stabs an update him on pt. 's plan of care.  Gerald Stabs stated, "Eynon's mental status has been declining."

## 2015-07-10 NOTE — Progress Notes (Signed)
EEG completed, results pending. 

## 2015-07-10 NOTE — ED Notes (Signed)
Neurologist at the bedside 

## 2015-07-10 NOTE — ED Notes (Signed)
Dr. Stark Jock is aware of pt.s breathing / resp. Status.   Sats are 100 % on RA,  Her respirations increase with movement and stimulation

## 2015-07-10 NOTE — ED Notes (Signed)
Pt intermittently opening eyes and bucking ETT.  Dr. Stark Jock notified for sedation orders.

## 2015-07-10 NOTE — ED Notes (Signed)
[facility] [phone]

## 2015-07-10 NOTE — ED Notes (Signed)
Family did not want pt. To receive Keppra until they talk to Neuro

## 2015-07-10 NOTE — ED Notes (Signed)
Dr. Stark Jock order for intubation after     Speaking with pt.s sister [name] and talking with the neurologist.  Dr. Stark Jock also updated pt.s family that is present Shannon Garrett.

## 2015-07-10 NOTE — ED Notes (Signed)
Dr. Stark Jock updated family that has arrived to the room

## 2015-07-10 NOTE — ED Provider Notes (Signed)
CSN: [id number]     Arrival date & time 07/10/15  1003 History   First MD Initiated Contact with Patient 07/10/15 1006     Chief Complaint  Patient presents with  . Altered Mental Status     (Consider location/radiation/quality/duration/timing/severity/associated sxs/prior Treatment) HPI  Past Medical History  Diagnosis Date  . MS (multiple sclerosis) ([place])     a. Dx'd late 20's. b. Tx with Novantrone, Tysabri, Copaxone previously.  Marland Kitchen Hypertension   . Depression   . Glaucoma   . Cardiomyopathy ([place])     a.  Echo 04/29/12: Mild LVH, EF 20-25%, mild AI, moderate MR, moderate LAE, mild RAE, mild RVE, moderate TR, PASP 51, small pericardial effusion;   b. probably non-ischemic given multiple chemo-Tx agents used for MS and global LV dysfn on echo  . Chronic systolic heart failure [facility])    Past Surgical History  Procedure Laterality Date  . Ablation      uterine  . Cesarean section     Family History  Problem Relation Age of Onset  . Hypertension Mother   . Heart attack Neg Hx   . Cancer Mother     breast   . Cancer Father     prostate  . Multiple sclerosis Sister    Social History  Substance Use Topics  . Smoking status: Former Research scientist (life sciences)  . Smokeless tobacco: Never Used  . Alcohol Use: No   OB History    No data available     Review of Systems    Allergies  Sulfonamide derivatives and Penicillins  Home Medications   Prior to Admission medications   Medication Sig Start Date End Date Taking? Authorizing Provider  amantadine (SYMMETREL) 100 MG capsule Take 100 mg by mouth daily.    Yes Historical Provider, MD  baclofen (LIORESAL) 20 MG tablet Take 20 mg by mouth 3 (three) times daily.   Yes Historical Provider, MD  carvedilol (COREG) 12.5 MG tablet Take 1 tablet (12.5 mg total) by mouth 2 (two) times daily with a meal. 01/16/15  Yes Bonnielee Haff, MD  Cholecalciferol (VITAMIN D-3 PO) Take 1 tablet by mouth daily.   Yes Historical Provider, MD  ciprofloxacin  (CIPRO) 500 MG tablet Take 1 tablet (500 mg total) by mouth 2 (two) times daily. 07/07/15  Yes Robbie Lis, MD  CRANBERRY PO Take 100 mg by mouth daily.   Yes Historical Provider, MD  Cyanocobalamin (VITAMIN B-12 PO) Take 1 tablet by mouth daily.   Yes Historical Provider, MD  docusate sodium (COLACE) 100 MG capsule Take 100 mg by mouth 2 (two) times daily.   Yes Historical Provider, MD  DULoxetine (CYMBALTA) 60 MG capsule Take 60 mg by mouth daily.   Yes Historical Provider, MD  furosemide (LASIX) 80 MG tablet Take 1 tablet (80 mg total) by mouth 2 (two) times daily. 06/11/15  Yes Velvet Bathe, MD  imipramine (TOFRANIL) 50 MG tablet Take 1.5 tablets (75 mg total) by mouth every evening. For depressive disorder 01/05/15  Yes Gerlene Fee, NP  oxyCODONE-acetaminophen (PERCOCET) 7.5-325 MG tablet Take 1 tablet by mouth every 4 (four) hours as needed for severe pain.   Yes Historical Provider, MD  Propylene Glycol (SYSTANE BALANCE) 0.6 % SOLN Place 1 drop into both eyes 2 (two) times daily.   Yes Historical Provider, MD  solifenacin (VESICARE) 10 MG tablet Take 10 mg by mouth daily. For bladder spasms   Yes Historical Provider, MD  spironolactone (ALDACTONE) 25 MG tablet Take 25  mg by mouth daily. For CHF   Yes Historical Provider, MD  tamsulosin (FLOMAX) 0.4 MG CAPS capsule Take 0.4 mg by mouth daily. For incontinence   Yes Historical Provider, MD  acetaminophen (TYLENOL) 325 MG tablet Take 2 tablets (650 mg total) by mouth every 6 (six) hours as needed for mild pain or fever (or Fever >/= 101). 07/07/15   Robbie Lis, MD  feeding supplement, ENSURE ENLIVE, (ENSURE ENLIVE) LIQD Take 237 mLs by mouth 2 (two) times daily as needed (Please provide to pt if she she eats less than 50% of breakfast and dinner). 07/07/15   Robbie Lis, MD  gabapentin (NEURONTIN) 300 MG capsule Take 3 capsules (900 mg total) by mouth 3 (three) times daily. Patient not taking: Reported on 07/10/2015 07/07/15   Robbie Lis, MD   latanoprost (XALATAN) 0.005 % ophthalmic solution Place 1 drop into both eyes at bedtime.    Historical Provider, MD  magnesium hydroxide (MILK OF MAGNESIA) 400 MG/5ML suspension Take 30 mLs by mouth every 8 (eight) hours as needed for mild constipation or moderate constipation.    Historical Provider, MD  ondansetron (ZOFRAN) 4 MG tablet Take 1 tablet (4 mg total) by mouth every 6 (six) hours as needed for nausea. 07/07/15   Robbie Lis, MD  polyethylene glycol (MIRALAX / Floria Raveling) packet Take 17 g by mouth 2 (two) times daily. Patient taking differently: Take 17 g by mouth daily as needed for mild constipation.  01/05/15   Gerlene Fee, NP  potassium chloride (KLOR-CON) 20 MEQ packet Take 20 mEq by mouth daily. Patient not taking: Reported on 07/10/2015 01/16/15   Bonnielee Haff, MD   BP 110/76 mmHg  Pulse 100  Temp(Src) 97.9 F (36.6 C) (Rectal)  Resp 16  SpO2 100% Physical Exam  ED Course  .Lumbar Puncture Date/Time: 07/10/2015 6:24 PM Performed by: Blanchie Dessert Authorized by: Blanchie Dessert Consent: The procedure was performed in an emergent situation. Relevant documents: relevant documents present and verified Site marked: the operative site was marked Imaging studies: imaging studies available Required items: required blood products, implants, devices, and special equipment available Patient identity confirmed: arm band Time out: Immediately prior to procedure a "time out" was called to verify the correct patient, procedure, equipment, support staff and site/side marked as required. Indications: evaluation for altered mental status Anesthesia: local infiltration Local anesthetic: lidocaine 1% without epinephrine Anesthetic total: 4 ml Patient sedated: yes Sedatives: propofol Preparation: Patient was prepped and draped in the usual sterile fashion. Lumbar space: L3-L4 interspace Patient's position: left lateral decubitus Needle gauge: 18 Needle type: diamond  point Number of attempts: 3 Patient tolerance: Patient tolerated the procedure well with no immediate complications Comments: Unable to get fluid   (including critical care time) Labs Review Labs Reviewed  BASIC METABOLIC PANEL - Abnormal; Notable for the following:    Sodium 146 (*)    Glucose, Bld 130 (*)    BUN 31 (*)    Creatinine, Ser 1.84 (*)    GFR calc non Af Amer 30 (*)    GFR calc Af Amer 35 (*)    All other components within normal limits  CBC WITH DIFFERENTIAL/PLATELET - Abnormal; Notable for the following:    Hemoglobin 11.7 (*)    RDW 16.1 (*)    All other components within normal limits  URINALYSIS, ROUTINE W REFLEX MICROSCOPIC (NOT AT [facility]) - Abnormal; Notable for the following:    Leukocytes, UA MODERATE (*)    All  other components within normal limits  URINE MICROSCOPIC-ADD ON - Abnormal; Notable for the following:    Squamous Epithelial / LPF 0-5 (*)    Bacteria, UA FEW (*)    All other components within normal limits  HEPATIC FUNCTION PANEL - Abnormal; Notable for the following:    Alkaline Phosphatase 166 (*)    All other components within normal limits  I-STAT ARTERIAL BLOOD GAS, ED - Abnormal; Notable for the following:    pH, Arterial 7.336 (*)    pCO2 arterial 56.7 (*)    pO2, Arterial 412.0 (*)    Bicarbonate 30.3 (*)    Acid-Base Excess 3.0 (*)    All other components within normal limits  I-STAT ARTERIAL BLOOD GAS, ED - Abnormal; Notable for the following:    pO2, Arterial 417.0 (*)    Bicarbonate 26.7 (*)    All other components within normal limits  CULTURE, BLOOD (ROUTINE X 2)  CULTURE, BLOOD (ROUTINE X 2)  CSF CULTURE  GRAM STAIN  HERPES SIMPLEX VIRUS CULTURE  URINE RAPID DRUG SCREEN, HOSP PERFORMED  LACTIC ACID, PLASMA  CK  AMMONIA  ETHANOL  LACTIC ACID, PLASMA  TSH  VITAMIN B12  CSF CELL COUNT WITH DIFFERENTIAL  CSF CELL COUNT WITH DIFFERENTIAL  GLUCOSE, CSF  PROTEIN, CSF  HERPES SIMPLEX VIRUS(HSV) DNA BY PCR  BLOOD GAS,  ARTERIAL    Imaging Review Ct Head Wo Contrast  07/10/2015  CLINICAL DATA:  Patient with possible aspiration. Possible change in mental status. EXAM: CT HEAD WITHOUT CONTRAST TECHNIQUE: Contiguous axial images were obtained from the base of the skull through the vertex without intravenous contrast. COMPARISON:  Brain CT 07/04/2015. FINDINGS: Ventricles and sulci are prominent compatible with atrophy. Periventricular and subcortical white matter hypodensity compatible with chronic white matter disease. No evidence for acute cortically based infarct, intracranial hemorrhage, mass lesion or mass-effect. Orbits are unremarkable. Mucosal thickening within the ethmoid air cells and frontal sinus. Mastoid air cells are unremarkable. Calvarium is intact. IMPRESSION: No acute intracranial process. Atrophy and chronic white matter disease. Electronically Signed   By: Lovey Newcomer M.D.   On: 07/10/2015 12:16   Dg Chest Port 1 View  07/10/2015  CLINICAL DATA:  Post intubation with history of hypertension EXAM: PORTABLE CHEST 1 VIEW COMPARISON:  07/10/2015 FINDINGS: Endotracheal tube tip is 5.8 cm above the carina. Tip dislocated above the clavicle level. Lungs are clear. Mild cardiac enlargement stable. IMPRESSION: Endotracheal tube as described Electronically Signed   By: Skipper Cliche M.D.   On: 07/10/2015 16:27   Dg Chest Port 1 View  07/10/2015  CLINICAL DATA:  Loss of consciousness.  Possible aspiration. EXAM: PORTABLE CHEST 1 VIEW COMPARISON:  Chest radiograph 07/04/2015. FINDINGS: Patient is rotated. Stable cardiac and mediastinal contours. Low lung volumes. No consolidative pulmonary opacities. No pleural effusion or pneumothorax. Regional skeleton is unremarkable. IMPRESSION: No acute cardiopulmonary process. Electronically Signed   By: Lovey Newcomer M.D.   On: 07/10/2015 10:48   I have personally reviewed and evaluated these images and lab results as part of my medical decision-making.   EKG  Interpretation   Date/Time:  Sunday July 10 2015 10:49:16 EST Ventricular Rate:  82 PR Interval:  170 QRS Duration: 100 QT Interval:  427 QTC Calculation: 499 R Axis:   31 Text Interpretation:  Sinus rhythm Borderline prolonged QT interval  Confirmed by Stark Jock  MD, DOUGLAS ([id number]) on 07/10/2015 10:53:33 AM      MDM   Final diagnoses:  Acute encephalopathy  Acute respiratory failure, unspecified whether with hypoxia or hypercapnia ([place])  Family history of MS (multiple sclerosis)    Neurology requested an LP. Patient is completely sedated on propofol and currently on the ventilator. She was placed in the left lateral decubitus position but unable to obtain any fluid. Patient will require interventional radiology for LP.    Blanchie Dessert, MD 07/10/15 1825

## 2015-07-10 NOTE — H&P (Addendum)
PULMONARY / CRITICAL CARE MEDICINE   Name: Shannon Garrett MRN: [id number] DOB: 04-15-1961    ADMISSION DATE:  07/10/2015 CONSULTATION DATE:  07/10/2015  REFERRING MD:  Stark Jock  CHIEF COMPLAINT:  Acute encephalopathy  HISTORY OF PRESENT ILLNESS:   55 y/o female with a history of MS and multiple admissions for foley catheter associated UTI was admitted from [facility] hospital on 07/09/2014 after being brought in by EMS from her SNF.  She was just discharged from [facility] on 1/6 after being treated for a UTI from acinetobacter.  She was discharged on Cipro.  Today she was found nearly unresponsive in her SNF.  No family at bedside, no one available to provide history.    PAST MEDICAL HISTORY :  She  has a past medical history of MS (multiple sclerosis) ([place]); Hypertension; Depression; Glaucoma; Cardiomyopathy ([place]); and Chronic systolic heart failure ([place]).  PAST SURGICAL HISTORY: She  has past surgical history that includes Ablation and Cesarean section.  Allergies  Allergen Reactions  . Sulfonamide Derivatives Hives and Shortness Of Breath  . Penicillins Hives and Itching    Has patient had a PCN reaction causing immediate rash, facial/tongue/throat swelling, SOB or lightheadedness with hypotension: unknown Has patient had a PCN reaction causing severe rash involving mucus membranes or skin necrosis: unknown Has patient had a PCN reaction that required hospitalization: unknown Has patient had a PCN reaction occurring within the last 10 years: unknown If all of the above answers are "NO", then may proceed with Cephalosporin use. Prior course of rocephin charted 05/2015    No current facility-administered medications on file prior to encounter.   Current Outpatient Prescriptions on File Prior to Encounter  Medication Sig  . amantadine (SYMMETREL) 100 MG capsule Take 100 mg by mouth daily.   . baclofen (LIORESAL) 20 MG tablet Take 20 mg by mouth 3 (three) times daily.  . carvedilol  (COREG) 12.5 MG tablet Take 1 tablet (12.5 mg total) by mouth 2 (two) times daily with a meal.  . Cholecalciferol (VITAMIN D-3 PO) Take 1 tablet by mouth daily.  . ciprofloxacin (CIPRO) 500 MG tablet Take 1 tablet (500 mg total) by mouth 2 (two) times daily.  Marland Kitchen CRANBERRY PO Take 100 mg by mouth daily.  . Cyanocobalamin (VITAMIN B-12 PO) Take 1 tablet by mouth daily.  Marland Kitchen docusate sodium (COLACE) 100 MG capsule Take 100 mg by mouth 2 (two) times daily.  . DULoxetine (CYMBALTA) 60 MG capsule Take 60 mg by mouth daily.  . furosemide (LASIX) 80 MG tablet Take 1 tablet (80 mg total) by mouth 2 (two) times daily.  Marland Kitchen imipramine (TOFRANIL) 50 MG tablet Take 1.5 tablets (75 mg total) by mouth every evening. For depressive disorder  . Propylene Glycol (SYSTANE BALANCE) 0.6 % SOLN Place 1 drop into both eyes 2 (two) times daily.  . solifenacin (VESICARE) 10 MG tablet Take 10 mg by mouth daily. For bladder spasms  . spironolactone (ALDACTONE) 25 MG tablet Take 25 mg by mouth daily. For CHF  . tamsulosin (FLOMAX) 0.4 MG CAPS capsule Take 0.4 mg by mouth daily. For incontinence  . acetaminophen (TYLENOL) 325 MG tablet Take 2 tablets (650 mg total) by mouth every 6 (six) hours as needed for mild pain or fever (or Fever >/= 101).  . feeding supplement, ENSURE ENLIVE, (ENSURE ENLIVE) LIQD Take 237 mLs by mouth 2 (two) times daily as needed (Please provide to pt if she she eats less than 50% of breakfast and dinner).  Marland Kitchen  gabapentin (NEURONTIN) 300 MG capsule Take 3 capsules (900 mg total) by mouth 3 (three) times daily. (Patient not taking: Reported on 07/10/2015)  . latanoprost (XALATAN) 0.005 % ophthalmic solution Place 1 drop into both eyes at bedtime.  . magnesium hydroxide (MILK OF MAGNESIA) 400 MG/5ML suspension Take 30 mLs by mouth every 8 (eight) hours as needed for mild constipation or moderate constipation.  . ondansetron (ZOFRAN) 4 MG tablet Take 1 tablet (4 mg total) by mouth every 6 (six) hours as needed for  nausea.  . polyethylene glycol (MIRALAX / GLYCOLAX) packet Take 17 g by mouth 2 (two) times daily. (Patient taking differently: Take 17 g by mouth daily as needed for mild constipation. )  . potassium chloride (KLOR-CON) 20 MEQ packet Take 20 mEq by mouth daily. (Patient not taking: Reported on 07/10/2015)    FAMILY HISTORY: SOCIAL HISTORY: REVIEW OF SYSTEMS:   Cannot obtain due to intubation   SUBJECTIVE:  As above  VITAL SIGNS: BP 135/76 mmHg  Pulse 100  Temp(Src) 97.9 F (36.6 C) (Rectal)  Resp 16  SpO2 100%  HEMODYNAMICS:    VENTILATOR SETTINGS: Vent Mode:  [-] PRVC FiO2 (%):  [100 %] 100 % Set Rate:  [16 bmp] 16 bmp Vt Set:  [450 mL] 450 mL PEEP:  [5 cmH20] 5 cmH20 Plateau Pressure:  [16 cmH20] 16 cmH20  INTAKE / OUTPUT:    PHYSICAL EXAMINATION: General:  Obese, on vent Neuro:  Sedated on propofol, no withdrawal to pain HEENT:  NCAT, ETTin place, Pupils sluggish, minimal response to light Cardiovascular:  RRR, no mgr Lungs:  CTA B Abdomen:  BS+, soft, non tender  Musculoskeletal:  Diminished tone but sedated Skin:  Edema all over, no breakdown anteriorly, could not examine sacrum  LABS:  BMET  Recent Labs Lab 07/04/15 1227 07/05/15 0355 07/10/15 1124  NA 148* 143 146*  K 3.8 3.6 3.5  CL 105 107 106  CO2 29 26 27   BUN 20 19 31*  CREATININE 1.21* 0.95 1.84*  GLUCOSE 140* 119* 130*    Electrolytes  Recent Labs Lab 07/04/15 1227 07/05/15 0355 07/10/15 1124  CALCIUM 10.0 8.5* 9.7    CBC  Recent Labs Lab 07/04/15 1227 07/05/15 0355 07/10/15 1124  WBC 9.4 7.1 6.1  HGB 12.0 10.9* 11.7*  HCT 36.5 33.2* 36.0  PLT 368 307 276    Coag's  Recent Labs Lab 07/04/15 1852  APTT 36  INR 1.27    Sepsis Markers  Recent Labs Lab 07/04/15 1227 07/04/15 1800 07/04/15 2150 07/10/15 1507  LATICACIDVEN  --  1.5 1.9 0.7  PROCALCITON <0.10  --   --   --     ABG  Recent Labs Lab 07/10/15 1524  PHART 7.336*  PCO2ART 56.7*  PO2ART  412.0*    Liver Enzymes  Recent Labs Lab 07/04/15 1227 07/10/15 1507  AST 20 17  ALT 30 31  ALKPHOS 216* 166*  BILITOT 0.8 0.6  ALBUMIN 4.1 3.6    Cardiac Enzymes No results for input(s): TROPONINI, PROBNP in the last 168 hours.  Glucose No results for input(s): GLUCAP in the last 168 hours.  Imaging Ct Head Wo Contrast  07/10/2015  CLINICAL DATA:  Patient with possible aspiration. Possible change in mental status. EXAM: CT HEAD WITHOUT CONTRAST TECHNIQUE: Contiguous axial images were obtained from the base of the skull through the vertex without intravenous contrast. COMPARISON:  Brain CT 07/04/2015. FINDINGS: Ventricles and sulci are prominent compatible with atrophy. Periventricular and subcortical white matter  hypodensity compatible with chronic white matter disease. No evidence for acute cortically based infarct, intracranial hemorrhage, mass lesion or mass-effect. Orbits are unremarkable. Mucosal thickening within the ethmoid air cells and frontal sinus. Mastoid air cells are unremarkable. Calvarium is intact. IMPRESSION: No acute intracranial process. Atrophy and chronic white matter disease. Electronically Signed   By: Lovey Newcomer M.D.   On: 07/10/2015 12:16   Dg Chest Port 1 View  07/10/2015  CLINICAL DATA:  Post intubation with history of hypertension EXAM: PORTABLE CHEST 1 VIEW COMPARISON:  07/10/2015 FINDINGS: Endotracheal tube tip is 5.8 cm above the carina. Tip dislocated above the clavicle level. Lungs are clear. Mild cardiac enlargement stable. IMPRESSION: Endotracheal tube as described Electronically Signed   By: Skipper Cliche M.D.   On: 07/10/2015 16:27   Dg Chest Port 1 View  07/10/2015  CLINICAL DATA:  Loss of consciousness.  Possible aspiration. EXAM: PORTABLE CHEST 1 VIEW COMPARISON:  Chest radiograph 07/04/2015. FINDINGS: Patient is rotated. Stable cardiac and mediastinal contours. Low lung volumes. No consolidative pulmonary opacities. No pleural effusion or  pneumothorax. Regional skeleton is unremarkable. IMPRESSION: No acute cardiopulmonary process. Electronically Signed   By: Lovey Newcomer M.D.   On: 07/10/2015 10:48     STUDIES:  1/8 CT head >   CULTURES: 1/8 Blood X2 >   ANTIBIOTICS: Vanc 1/8 >  Ceftriaxone x1 1/8 Ceftaz 1/8 >  Acyclovir 1/8 >   SIGNIFICANT EVENTS:   LINES/TUBES: 1/8 ETT >   DISCUSSION: 55 y.o female with multiple sclerosis who lives in a SNF and has recurrent UTI's was admitted from the [facility] ER with acute encephalopathy of uncertain etiology.  Ddx includes polypharmacy, cipro effect, non-convulsive status epilepticus, no clear etiology right now.  Doesn't appear septic.  Neuro work up pending.  ASSESSMENT / PLAN:  PULMONARY A: Acute respiratory failure with hypoxemia P:   Full vent support vap bundle  CARDIOVASCULAR A:  No acute issues P:  Tele   RENAL A:   Hypernatremia AKI uncertain etiology P:   Renal dose meds IVF Monitor BMET and UOP Replace electrolytes as needed   GASTROINTESTINAL A:   No acute issues P:   OG tube pepcid Tube feedings  HEMATOLOGIC A:   No acute issues P:  Monitor for bleeding dvt prophylaxis  INFECTIOUS A:   Recurrent UTI, pyuria on admission, recent acetobacter P:   Cultures/antibiotics as above LP per neuro  ENDOCRINE A:   No acute issues   P:   Monitor glucose  NEUROLOGIC A:   Acute encephalopathy, see discussion above; favor polypharmacy at play here but picture not clear P:   Hold most home meds Keep baclofen  EEG per neuro LP per neuro Imaging per neuro RASS goal: 0 Intermittent fentanyl    FAMILY  - Updates: none bedside  - Inter-disciplinary family meet or Palliative Care meeting due by:  day 7  My cc time 35 minutes  Roselie Awkward, MD [place] [facility] Pager: [phone] Cell: [phone] After 3pm or if no response, call [phone]   07/10/2015, 5:12 PM

## 2015-07-10 NOTE — Progress Notes (Signed)
Verified order for load of fosphenytoin.  Maintenance dosing changed to phenytoin as fosphenytoin has a 5 day limit on its use d/t accumulation of formate which is converted to formaldehyde. Since there is no reason to continue fosphenytoin in this patient for those 5 days, will save the patient money by converting to less expensive agent.   Shannon Garrett D. Pesach Frisch, PharmD, BCPS Clinical Pharmacist 07/10/2015 5:30 PM

## 2015-07-10 NOTE — ED Notes (Signed)
EDP at bedside to attempt LP

## 2015-07-10 NOTE — Consult Note (Signed)
Requesting Physician: Dr.  Stark Jock, Er    Reason fo consultation:  Altered mental status  HPI:                                                                                                                                         Crickett Stocksdale is an 55 y.o. female patient who presented with altered mental status from the skilled nursing facility. Patient has multiple sclerosis. Per review of EMR, she had multiple admissions for foley catheter associated UTI was admitted from [facility] hospital on 07/09/2014 after being brought in by EMS from her SNF. She was just discharged from [facility] on 1/6 after being treated for a UTI from acinetobacter. She was discharged on Cipro. Today she was found nearly unresponsive in her SNF. Some family members were at bedside but they do not know any further history regarding onset of her altered mental status.   Past Medical History  Diagnosis Date  . MS (multiple sclerosis) ([place])     a. Dx'd late 20's. b. Tx with Novantrone, Tysabri, Copaxone previously.  Marland Kitchen Hypertension   . Depression   . Glaucoma   . Cardiomyopathy ([place])     a.  Echo 04/29/12: Mild LVH, EF 20-25%, mild AI, moderate MR, moderate LAE, mild RAE, mild RVE, moderate TR, PASP 51, small pericardial effusion;   b. probably non-ischemic given multiple chemo-Tx agents used for MS and global LV dysfn on echo  . Chronic systolic heart failure [facility])     Past Surgical History  Procedure Laterality Date  . Ablation      uterine  . Cesarean section      Family History  Problem Relation Age of Onset  . Hypertension Mother   . Heart attack Neg Hx   . Cancer Mother     breast   . Cancer Father     prostate  . Multiple sclerosis Sister    Social History:  reports that she has quit smoking. She has never used smokeless tobacco. She reports that she does not drink alcohol or use illicit drugs.  Allergies:  Allergies  Allergen Reactions  . Sulfonamide Derivatives Hives and Shortness Of  Breath  . Penicillins Hives and Itching    Has patient had a PCN reaction causing immediate rash, facial/tongue/throat swelling, SOB or lightheadedness with hypotension: unknown Has patient had a PCN reaction causing severe rash involving mucus membranes or skin necrosis: unknown Has patient had a PCN reaction that required hospitalization: unknown Has patient had a PCN reaction occurring within the last 10 years: unknown If all of the above answers are "NO", then may proceed with Cephalosporin use. Prior course of rocephin charted 05/2015    Medications:  Current facility-administered medications:  .  acyclovir (ZOVIRAX) 500 mg in dextrose 5 % 100 mL IVPB, 10 mg/kg (Ideal), Intravenous, 3 times per day, Veryl Speak, MD .  cefTRIAXone (ROCEPHIN) 1 g in dextrose 5 % 50 mL IVPB, 1 g, Intravenous, Once, Veryl Speak, MD .  fosPHENYtoin (CEREBYX) 1,497 mg PE in sodium chloride 0.9 % 50 mL IVPB, 15 mg PE/kg, Intravenous, Once, Jane Broughton Fuller Mandril, MD .  Derrill Memo ON 07/11/2015] fosPHENYtoin (CEREBYX) 100 mg PE in sodium chloride 0.9 % 25 mL IVPB, 100 mg PE, Intravenous, 3 times per day, Briena Swingler Fuller Mandril, MD .  propofol (DIPRIVAN) 1000 MG/100ML infusion, 5-70 mcg/kg/min, Intravenous, Titrated, Veryl Speak, MD, Last Rate: 12 mL/hr at 07/10/15 1650, 20 mcg/kg/min at 07/10/15 1650 .  vancomycin (VANCOCIN) IVPB 1000 mg/200 mL premix, 1,000 mg, Intravenous, Once, Veryl Speak, MD  Current outpatient prescriptions:  .  amantadine (SYMMETREL) 100 MG capsule, Take 100 mg by mouth daily. , Disp: , Rfl:  .  baclofen (LIORESAL) 20 MG tablet, Take 20 mg by mouth 3 (three) times daily., Disp: , Rfl:  .  carvedilol (COREG) 12.5 MG tablet, Take 1 tablet (12.5 mg total) by mouth 2 (two) times daily with a meal., Disp: , Rfl:  .  Cholecalciferol (VITAMIN D-3 PO), Take 1 tablet by  mouth daily., Disp: , Rfl:  .  ciprofloxacin (CIPRO) 500 MG tablet, Take 1 tablet (500 mg total) by mouth 2 (two) times daily., Disp: 14 tablet, Rfl: 0 .  CRANBERRY PO, Take 100 mg by mouth daily., Disp: , Rfl:  .  Cyanocobalamin (VITAMIN B-12 PO), Take 1 tablet by mouth daily., Disp: , Rfl:  .  docusate sodium (COLACE) 100 MG capsule, Take 100 mg by mouth 2 (two) times daily., Disp: , Rfl:  .  DULoxetine (CYMBALTA) 60 MG capsule, Take 60 mg by mouth daily., Disp: , Rfl:  .  furosemide (LASIX) 80 MG tablet, Take 1 tablet (80 mg total) by mouth 2 (two) times daily., Disp: 60 tablet, Rfl: 0 .  imipramine (TOFRANIL) 50 MG tablet, Take 1.5 tablets (75 mg total) by mouth every evening. For depressive disorder, Disp: 90 tablet, Rfl: 11 .  oxyCODONE-acetaminophen (PERCOCET) 7.5-325 MG tablet, Take 1 tablet by mouth every 4 (four) hours as needed for severe pain., Disp: , Rfl:  .  Propylene Glycol (SYSTANE BALANCE) 0.6 % SOLN, Place 1 drop into both eyes 2 (two) times daily., Disp: , Rfl:  .  solifenacin (VESICARE) 10 MG tablet, Take 10 mg by mouth daily. For bladder spasms, Disp: , Rfl:  .  spironolactone (ALDACTONE) 25 MG tablet, Take 25 mg by mouth daily. For CHF, Disp: , Rfl:  .  tamsulosin (FLOMAX) 0.4 MG CAPS capsule, Take 0.4 mg by mouth daily. For incontinence, Disp: , Rfl:  .  acetaminophen (TYLENOL) 325 MG tablet, Take 2 tablets (650 mg total) by mouth every 6 (six) hours as needed for mild pain or fever (or Fever >/= 101)., Disp: 30 tablet, Rfl: 0 .  feeding supplement, ENSURE ENLIVE, (ENSURE ENLIVE) LIQD, Take 237 mLs by mouth 2 (two) times daily as needed (Please provide to pt if she she eats less than 50% of breakfast and dinner)., Disp: 237 mL, Rfl: 12 .  gabapentin (NEURONTIN) 300 MG capsule, Take 3 capsules (900 mg total) by mouth 3 (three) times daily. (Patient not taking: Reported on 07/10/2015), Disp: 90 capsule, Rfl: 5 .  latanoprost (XALATAN) 0.005 % ophthalmic solution, Place 1 drop into  both eyes  at bedtime., Disp: , Rfl:  .  magnesium hydroxide (MILK OF MAGNESIA) 400 MG/5ML suspension, Take 30 mLs by mouth every 8 (eight) hours as needed for mild constipation or moderate constipation., Disp: , Rfl:  .  ondansetron (ZOFRAN) 4 MG tablet, Take 1 tablet (4 mg total) by mouth every 6 (six) hours as needed for nausea., Disp: 20 tablet, Rfl: 0 .  polyethylene glycol (MIRALAX / GLYCOLAX) packet, Take 17 g by mouth 2 (two) times daily. (Patient taking differently: Take 17 g by mouth daily as needed for mild constipation. ), Disp: 120 packet, Rfl: 11 .  potassium chloride (KLOR-CON) 20 MEQ packet, Take 20 mEq by mouth daily. (Patient not taking: Reported on 07/10/2015), Disp: , Rfl:    ROS:                                                                                                                                       History obtained from unobtainable from patient due to mental status  Neurologic Examination:                                                                                                      Blood pressure 135/76, pulse 100, temperature 97.9 F (36.6 C), temperature source Rectal, resp. rate 16, SpO2 100 %. Limited neurological examination.  Patient is somnolent, altered, eyes closed , no specific gaze deviation or preference. Noted to blink intermittently. Pupils equal reactive Nonverbal, does not follow commands. Facial grimace appears symmetric. She had apneic episodes with impending respiratory failure and was eventually intubated in the ER.  Minimal withdrawal to stimulus in all 4 extremities. No abnormal involuntary movements or tremors or  twitches were seen.    Lab Results: Basic Metabolic Panel:  Recent Labs Lab 07/04/15 1227 07/05/15 0355 07/10/15 1124  NA 148* 143 146*  K 3.8 3.6 3.5  CL 105 107 106  CO2 29 26 27   GLUCOSE 140* 119* 130*  BUN 20 19 31*  CREATININE 1.21* 0.95 1.84*  CALCIUM 10.0 8.5* 9.7    Liver Function Tests:  Recent  Labs Lab 07/04/15 1227 07/10/15 1507  AST 20 17  ALT 30 31  ALKPHOS 216* 166*  BILITOT 0.8 0.6  PROT 8.1 7.5  ALBUMIN 4.1 3.6   No results for input(s): LIPASE, AMYLASE in the last 168 hours.  Recent Labs Lab 07/10/15 1507  AMMONIA 24    CBC:  Recent Labs Lab 07/04/15 1227 07/05/15 0355 07/10/15 1124  WBC 9.4  7.1 6.1  NEUTROABS 7.6  --  3.8  HGB 12.0 10.9* 11.7*  HCT 36.5 33.2* 36.0  MCV 83.0 82.6 82.6  PLT 368 307 276    Cardiac Enzymes:  Recent Labs Lab 07/10/15 1507  CKTOTAL 91    Lipid Panel: No results for input(s): CHOL, TRIG, HDL, CHOLHDL, VLDL, LDLCALC in the last 168 hours.  CBG: No results for input(s): GLUCAP in the last 168 hours.  Microbiology: Results for orders placed or performed during the hospital encounter of 07/04/15  Urine culture     Status: None   Collection Time: 07/04/15  2:00 PM  Result Value Ref Range Status   Specimen Description URINE, CATHETERIZED  Final   Special Requests NONE  Final   Culture   Final    >=100,000 COLONIES/mL ACINETOBACTER CALCOACETICUS/BAUMANNII COMPLEX Performed at [facility]    Report Status 07/07/2015 FINAL  Final   Organism ID, Bacteria ACINETOBACTER CALCOACETICUS/BAUMANNII COMPLEX  Final      Susceptibility   Acinetobacter calcoaceticus/baumannii complex - MIC*    CEFTAZIDIME 4 SENSITIVE Sensitive     CEFTRIAXONE 16 INTERMEDIATE Intermediate     CIPROFLOXACIN <=0.25 SENSITIVE Sensitive     GENTAMICIN <=1 SENSITIVE Sensitive     IMIPENEM <=0.25 SENSITIVE Sensitive     PIP/TAZO <=4 SENSITIVE Sensitive     TRIMETH/SULFA <=20 SENSITIVE Sensitive     AMPICILLIN/SULBACTAM <=2 SENSITIVE Sensitive     * >=100,000 COLONIES/mL ACINETOBACTER CALCOACETICUS/BAUMANNII COMPLEX  Blood culture (routine x 2)     Status: None   Collection Time: 07/04/15  2:50 PM  Result Value Ref Range Status   Specimen Description BLOOD LEFT ARM  Final   Special Requests IN PEDIATRIC BOTTLE  3CC  Final   Culture    Final    NO GROWTH 5 DAYS Performed at [facility]    Report Status 07/09/2015 FINAL  Final  Blood culture (routine x 2)     Status: None   Collection Time: 07/04/15  2:55 PM  Result Value Ref Range Status   Specimen Description BLOOD LEFT ARM  Final   Special Requests BOTTLES DRAWN AEROBIC AND ANAEROBIC  10CC  Final   Culture   Final    NO GROWTH 5 DAYS Performed at [facility]    Report Status 07/09/2015 FINAL  Final  MRSA PCR Screening     Status: None   Collection Time: 07/04/15  5:15 PM  Result Value Ref Range Status   MRSA by PCR NEGATIVE NEGATIVE Final    Comment:        The GeneXpert MRSA Assay (FDA approved for NASAL specimens only), is one component of a comprehensive MRSA colonization surveillance program. It is not intended to diagnose MRSA infection nor to guide or monitor treatment for MRSA infections.      Imaging: Ct Head Wo Contrast  07/10/2015  CLINICAL DATA:  Patient with possible aspiration. Possible change in mental status. EXAM: CT HEAD WITHOUT CONTRAST TECHNIQUE: Contiguous axial images were obtained from the base of the skull through the vertex without intravenous contrast. COMPARISON:  Brain CT 07/04/2015. FINDINGS: Ventricles and sulci are prominent compatible with atrophy. Periventricular and subcortical white matter hypodensity compatible with chronic white matter disease. No evidence for acute cortically based infarct, intracranial hemorrhage, mass lesion or mass-effect. Orbits are unremarkable. Mucosal thickening within the ethmoid air cells and frontal sinus. Mastoid air cells are unremarkable. Calvarium is intact. IMPRESSION: No acute intracranial process. Atrophy and chronic white matter disease. Electronically  Signed   By: Lovey Newcomer M.D.   On: 07/10/2015 12:16   Dg Chest Port 1 View  07/10/2015  CLINICAL DATA:  Post intubation with history of hypertension EXAM: PORTABLE CHEST 1 VIEW COMPARISON:  07/10/2015 FINDINGS: Endotracheal  tube tip is 5.8 cm above the carina. Tip dislocated above the clavicle level. Lungs are clear. Mild cardiac enlargement stable. IMPRESSION: Endotracheal tube as described Electronically Signed   By: Skipper Cliche M.D.   On: 07/10/2015 16:27   Dg Chest Port 1 View  07/10/2015  CLINICAL DATA:  Loss of consciousness.  Possible aspiration. EXAM: PORTABLE CHEST 1 VIEW COMPARISON:  Chest radiograph 07/04/2015. FINDINGS: Patient is rotated. Stable cardiac and mediastinal contours. Low lung volumes. No consolidative pulmonary opacities. No pleural effusion or pneumothorax. Regional skeleton is unremarkable. IMPRESSION: No acute cardiopulmonary process. Electronically Signed   By: Lovey Newcomer M.D.   On: 07/10/2015 10:48       Assessment and plan:   Sydell Ontiveros is an 55 y.o. female patient who presented with altered mental status that nursing facility . Etiology of this is unclear at this time. She has history of recurrent UTIs, recently discharged with a UTI treated with ciprofloxacin.  A stat EEG was done in the ER. Evidence of moderate to severe encephalopathy with generalized background slowing and intermittent theta range is seen with intermittent periods of backwards suppression. Abnormal Discharges Are Also Noted Intermittently without Evidence of Electrographic Seizures.    Due to respiratory failure with intermittent apneic episodes, patient was intubated in the ER . She'll be admitted to the ICU for further care.  The differential for her altered mental status would include a CNS infection. She has been empirically covered with antibiotics vancomycin and ceftriaxone as well as iv acyclovir for possible HSV infection. Ordered emperic Decadron 10 mg one dose followed by maintenance dose of 4 mg every 6 hours. LP will be done in the ER by the ER physician assistant, Lattie Haw.  She was initially given Keppra 500 mg when she presented to the ER with altered mental status. EEG was done after completing the  Keppra dose. As she continued to show abnormal epileptiform discharges on EEG, a loading dose of IV fosphenytoin 15 mg/kg was ordered following the EEG. She'll be started on a maintenance dose of 100 g every 8 hours starting tomorrow morning. She is on propofol currently, after intubation.  We'll plan to repeat EEG tomorrow morning.  MRI of the brain when stable for the study Metabolic and infectious workup has been ordered, unrevealing so far.   Neurology service will continue to follow up, please call for any further questions

## 2015-07-10 NOTE — ED Notes (Signed)
EEG being done at the bedside. Family updated and they went to see another family member

## 2015-07-10 NOTE — ED Notes (Signed)
Attempted to call Nursing staff at [place], no answer,.   , Will attempt to call again

## 2015-07-10 NOTE — ED Notes (Addendum)
Did rectal temp. Pt.s bottom was clean, dressing covering decubitis above her buttocks.

## 2015-07-10 NOTE — ED Notes (Signed)
Pt transported to 2S13 by Racheal Patches, RN

## 2015-07-10 NOTE — Procedures (Signed)
EEG report   Current facility-administered medications:  .  0.9 %  sodium chloride infusion, , Intravenous, Continuous, Juanito Doom, MD, Last Rate: 100 mL/hr at 07/10/15 1610 .  acyclovir (ZOVIRAX) 500 mg in dextrose 5 % 100 mL IVPB, 10 mg/kg (Ideal), Intravenous, 3 times per day, Veryl Speak, MD, Stopped at 07/10/15 1710 .  [START ON 07/11/2015] dexamethasone (DECADRON) injection 4 mg, 4 mg, Intravenous, 4 times per day, Kedarius Aloisi Fuller Mandril, MD .  Derrill Memo ON 07/11/2015] phenytoin (DILANTIN) injection 100 mg, 100 mg, Intravenous, 3 times per day, Lauren D Bajbus, RPH .  propofol (DIPRIVAN) 1000 MG/100ML infusion, 5-70 mcg/kg/min, Intravenous, Titrated, Veryl Speak, MD, Last Rate: 12 mL/hr at 07/10/15 1734, 20 mcg/kg/min at 07/10/15 1734 .  vancomycin (VANCOCIN) IVPB 1000 mg/200 mL premix, 1,000 mg, Intravenous, Once, Veryl Speak, MD, Last Rate: 200 mL/hr at 07/10/15 1745, 1,000 mg at 07/10/15 1745  Current outpatient prescriptions:  .  amantadine (SYMMETREL) 100 MG capsule, Take 100 mg by mouth daily. , Disp: , Rfl:  .  baclofen (LIORESAL) 20 MG tablet, Take 20 mg by mouth 3 (three) times daily., Disp: , Rfl:  .  carvedilol (COREG) 12.5 MG tablet, Take 1 tablet (12.5 mg total) by mouth 2 (two) times daily with a meal., Disp: , Rfl:  .  Cholecalciferol (VITAMIN D-3 PO), Take 1 tablet by mouth daily., Disp: , Rfl:  .  ciprofloxacin (CIPRO) 500 MG tablet, Take 1 tablet (500 mg total) by mouth 2 (two) times daily., Disp: 14 tablet, Rfl: 0 .  CRANBERRY PO, Take 100 mg by mouth daily., Disp: , Rfl:  .  Cyanocobalamin (VITAMIN B-12 PO), Take 1 tablet by mouth daily., Disp: , Rfl:  .  docusate sodium (COLACE) 100 MG capsule, Take 100 mg by mouth 2 (two) times daily., Disp: , Rfl:  .  DULoxetine (CYMBALTA) 60 MG capsule, Take 60 mg by mouth daily., Disp: , Rfl:  .  furosemide (LASIX) 80 MG tablet, Take 1 tablet (80 mg total) by mouth 2 (two) times daily., Disp: 60 tablet, Rfl: 0 .   imipramine (TOFRANIL) 50 MG tablet, Take 1.5 tablets (75 mg total) by mouth every evening. For depressive disorder, Disp: 90 tablet, Rfl: 11 .  oxyCODONE-acetaminophen (PERCOCET) 7.5-325 MG tablet, Take 1 tablet by mouth every 4 (four) hours as needed for severe pain., Disp: , Rfl:  .  Propylene Glycol (SYSTANE BALANCE) 0.6 % SOLN, Place 1 drop into both eyes 2 (two) times daily., Disp: , Rfl:  .  solifenacin (VESICARE) 10 MG tablet, Take 10 mg by mouth daily. For bladder spasms, Disp: , Rfl:  .  spironolactone (ALDACTONE) 25 MG tablet, Take 25 mg by mouth daily. For CHF, Disp: , Rfl:  .  tamsulosin (FLOMAX) 0.4 MG CAPS capsule, Take 0.4 mg by mouth daily. For incontinence, Disp: , Rfl:  .  acetaminophen (TYLENOL) 325 MG tablet, Take 2 tablets (650 mg total) by mouth every 6 (six) hours as needed for mild pain or fever (or Fever >/= 101)., Disp: 30 tablet, Rfl: 0 .  feeding supplement, ENSURE ENLIVE, (ENSURE ENLIVE) LIQD, Take 237 mLs by mouth 2 (two) times daily as needed (Please provide to pt if she she eats less than 50% of breakfast and dinner)., Disp: 237 mL, Rfl: 12 .  gabapentin (NEURONTIN) 300 MG capsule, Take 3 capsules (900 mg total) by mouth 3 (three) times daily. (Patient not taking: Reported on 07/10/2015), Disp: 90 capsule, Rfl: 5 .  latanoprost (XALATAN) 0.005 %  ophthalmic solution, Place 1 drop into both eyes at bedtime., Disp: , Rfl:  .  magnesium hydroxide (MILK OF MAGNESIA) 400 MG/5ML suspension, Take 30 mLs by mouth every 8 (eight) hours as needed for mild constipation or moderate constipation., Disp: , Rfl:  .  ondansetron (ZOFRAN) 4 MG tablet, Take 1 tablet (4 mg total) by mouth every 6 (six) hours as needed for nausea., Disp: 20 tablet, Rfl: 0 .  polyethylene glycol (MIRALAX / GLYCOLAX) packet, Take 17 g by mouth 2 (two) times daily. (Patient taking differently: Take 17 g by mouth daily as needed for mild constipation. ), Disp: 120 packet, Rfl: 11 .  potassium chloride (KLOR-CON) 20  MEQ packet, Take 20 mEq by mouth daily. (Patient not taking: Reported on 07/10/2015), Disp: , Rfl:   Introduction:  This is a 19 channel routine scalp EEG performed at the bedside with bipolar and monopolar montages arranged in accordance to the international 10/20 system of electrode placement. One channel was dedicated to EKG recording.   Findings:  Severe diffuse background slowing in the range of 4-5 Hz with intermittent periods of generalized background suppression was noted. Frequent intermittent sharply contoured generalized abnormal discharges with triphasic morphology were also seen, with no definite evidence of electrographic seizures were noted during this recording.   Impression:  This is an abnormal routine inpatient EEG, suggestive of moderate to severe encephalopathy. Frequent generalized intermittent abnormal discharges with triphasic morphology were noted, likely associated with encephalopathy. No definite evidence of electrographic seizures were seen during this recording.  Clinical correlation is recommended .

## 2015-07-10 NOTE — ED Notes (Signed)
Called [facility] to update them on pt.s status. Spoke with [employer]

## 2015-07-10 NOTE — ED Notes (Signed)
Paramedic's did not have any report on pt.  They came to [facility] to pick up pt. But did not receive a report form any staff .   Another paramedic team there told them they think they were feeding her oatmeal and she possibly did not swallow appropriately.  Do not see any oatmeal in pt,'s mouth . She does have bad oral care and attempted to clean her mouth and she closed her lips.  Pt. Will not open her eyes she does respond to  Tactile stimuli and pain.  She arrived with a foley and paramedics were using BVM upon arrival to our ED.

## 2015-07-10 NOTE — ED Provider Notes (Addendum)
CSN: [id number]     Arrival date & time 07/10/15  1003 History   First MD Initiated Contact with Patient 07/10/15 1006     Chief Complaint  Patient presents with  . Altered Mental Status     (Consider location/radiation/quality/duration/timing/severity/associated sxs/prior Treatment) HPI Comments: Patient is a 55 year old female with a history of advanced multiple sclerosis, neurogenic bladder with indwelling Foley catheter, and recent admissions for UTI/urosepsis. She is brought by EMS after being found minimally responsive at the extended care facility at which she resides. She had gurgling respirations and nasal trumpets were placed in field and the patient was brought here being ventilated by bag valve mask. She has no additional history due to severity of medical illness.  Patient is a 55 y.o. female presenting with altered mental status. The history is provided by the patient.  Altered Mental Status Presenting symptoms: lethargy and partial responsiveness   Severity:  Moderate Most recent episode:  Today Timing:  Constant Progression:  Unchanged Chronicity:  Recurrent   Past Medical History  Diagnosis Date  . MS (multiple sclerosis) ([place])     a. Dx'd late 20's. b. Tx with Novantrone, Tysabri, Copaxone previously.  Marland Kitchen Hypertension   . Depression   . Glaucoma   . Cardiomyopathy ([place])     a.  Echo 04/29/12: Mild LVH, EF 20-25%, mild AI, moderate MR, moderate LAE, mild RAE, mild RVE, moderate TR, PASP 51, small pericardial effusion;   b. probably non-ischemic given multiple chemo-Tx agents used for MS and global LV dysfn on echo  . Chronic systolic heart failure [facility])    Past Surgical History  Procedure Laterality Date  . Ablation      uterine  . Cesarean section     Family History  Problem Relation Age of Onset  . Hypertension Mother   . Heart attack Neg Hx   . Cancer Mother     breast   . Cancer Father     prostate  . Multiple sclerosis Sister    Social History   Substance Use Topics  . Smoking status: Former Research scientist (life sciences)  . Smokeless tobacco: Never Used  . Alcohol Use: No   OB History    No data available     Review of Systems  Unable to perform ROS: Acuity of condition      Allergies  Sulfonamide derivatives and Penicillins  Home Medications   Prior to Admission medications   Medication Sig Start Date End Date Taking? Authorizing Provider  acetaminophen (TYLENOL) 325 MG tablet Take 2 tablets (650 mg total) by mouth every 6 (six) hours as needed for mild pain or fever (or Fever >/= 101). 07/07/15   Robbie Lis, MD  amantadine (SYMMETREL) 100 MG capsule Take 100 mg by mouth daily.     Historical Provider, MD  baclofen (LIORESAL) 20 MG tablet Take 20 mg by mouth 3 (three) times daily.    Historical Provider, MD  carvedilol (COREG) 12.5 MG tablet Take 1 tablet (12.5 mg total) by mouth 2 (two) times daily with a meal. 01/16/15   Bonnielee Haff, MD  Cholecalciferol (VITAMIN D-3 PO) Take 1 tablet by mouth daily.    Historical Provider, MD  ciprofloxacin (CIPRO) 500 MG tablet Take 1 tablet (500 mg total) by mouth 2 (two) times daily. 07/07/15   Robbie Lis, MD  CRANBERRY PO Take 100 mg by mouth daily.    Historical Provider, MD  Cyanocobalamin (VITAMIN B-12 PO) Take 1 tablet by mouth daily.  Historical Provider, MD  docusate sodium (COLACE) 100 MG capsule Take 100 mg by mouth 2 (two) times daily.    Historical Provider, MD  DULoxetine (CYMBALTA) 60 MG capsule Take 60 mg by mouth daily.    Historical Provider, MD  feeding supplement, ENSURE ENLIVE, (ENSURE ENLIVE) LIQD Take 237 mLs by mouth 2 (two) times daily as needed (Please provide to pt if she she eats less than 50% of breakfast and dinner). 07/07/15   Robbie Lis, MD  furosemide (LASIX) 80 MG tablet Take 1 tablet (80 mg total) by mouth 2 (two) times daily. 06/11/15   Velvet Bathe, MD  gabapentin (NEURONTIN) 300 MG capsule Take 3 capsules (900 mg total) by mouth 3 (three) times daily. 07/07/15    Robbie Lis, MD  imipramine (TOFRANIL) 50 MG tablet Take 1.5 tablets (75 mg total) by mouth every evening. For depressive disorder 01/05/15   Gerlene Fee, NP  latanoprost (XALATAN) 0.005 % ophthalmic solution Place 1 drop into both eyes at bedtime.    Historical Provider, MD  magnesium hydroxide (MILK OF MAGNESIA) 400 MG/5ML suspension Take 30 mLs by mouth every 8 (eight) hours as needed for mild constipation or moderate constipation.    Historical Provider, MD  ondansetron (ZOFRAN) 4 MG tablet Take 1 tablet (4 mg total) by mouth every 6 (six) hours as needed for nausea. 07/07/15   Robbie Lis, MD  polyethylene glycol (MIRALAX / Floria Raveling) packet Take 17 g by mouth 2 (two) times daily. 01/05/15   Gerlene Fee, NP  potassium chloride (KLOR-CON) 20 MEQ packet Take 20 mEq by mouth daily. 01/16/15   Bonnielee Haff, MD  Propylene Glycol (SYSTANE BALANCE) 0.6 % SOLN Place 1 drop into both eyes 2 (two) times daily.    Historical Provider, MD  solifenacin (VESICARE) 10 MG tablet Take 10 mg by mouth daily. For bladder spasms    Historical Provider, MD  spironolactone (ALDACTONE) 25 MG tablet Take 25 mg by mouth daily. For CHF    Historical Provider, MD  tamsulosin (FLOMAX) 0.4 MG CAPS capsule Take 0.4 mg by mouth daily. For incontinence    Historical Provider, MD   There were no vitals taken for this visit. Physical Exam  Constitutional: She is oriented to person, place, and time. She appears well-developed and well-nourished.  Patient is a 55 year old female who appears chronically ill. She is minimally responsive.  HENT:  Head: Normocephalic and atraumatic.  Eyes: Pupils are equal, round, and reactive to light.  Neck: Normal range of motion. Neck supple.  Cardiovascular: Normal rate, regular rhythm and normal heart sounds.   No murmur heard. Pulmonary/Chest: Effort normal.  Respirations are shallow and breath sounds are somewhat coarse.  Abdominal: Soft. Bowel sounds are normal. She exhibits no  distension. There is no tenderness.  Musculoskeletal: She exhibits no edema.  Neurological: She is alert and oriented to person, place, and time.  Patient is minimally responsive. She will make an attempt to open her eyes to loud voice. She does not follow commands or respond to noxious stimuli.  Skin: Skin is warm and dry.  Nursing note and vitals reviewed.   ED Course  Procedures (including critical care time) Labs Review Labs Reviewed  BASIC METABOLIC PANEL  CBC WITH DIFFERENTIAL/PLATELET  URINALYSIS, ROUTINE W REFLEX MICROSCOPIC (NOT AT [facility])    Imaging Review No results found. I have personally reviewed and evaluated these images and lab results as part of my medical decision-making.   EKG Interpretation  Date/Time:  Sunday July 10 2015 10:49:16 EST Ventricular Rate:  82 PR Interval:  170 QRS Duration: 100 QT Interval:  427 QTC Calculation: 499 R Axis:   31 Text Interpretation:  Sinus rhythm Borderline prolonged QT interval  Confirmed by Stark Jock  MD, Delquan Poucher ([id number]) on 07/10/2015 10:53:33 AM      MDM   Final diagnoses:  None    Patient is a 55 year old female with history of multiple sclerosis. She has recently been in and out of the hospital with urinary tract infections and alterations in her mental status. This morning she was found at her extended care facility to be unresponsive and having difficulty breathing. EMS was called and nasal trumpets were placed. She was brought here being ventilated by bag valve mask. This ventilation was discontinued shortly after she arrived here and she was initially breathing and protecting her airway. She remained minimally responsive to voice and noxious stimuli, and added no useful history.  Workup was initiated including basic laboratory studies. This revealed only a minor worsening of her renal function, but were otherwise unremarkable. She is afebrile and has no white count. She's had recent UTIs, however the urinalysis today  was not impressive. Chest x-ray was clear.  She remained minimally responsive with slow respirations. She was continued on a nonrebreather. A neurology consult was requested and the patient was evaluated by Dr. Silverio Decamp. He had several studies to the workup and elected to order an EEG. This revealed significant slowing of her brain activity consistent with encephalopathy. The cause of this encephalopathy has not been identified at this time, however the patient will be given antibiotics for presumed infection. She will also be given acyclovir for presumed encephalitis. Ammonia level was normal and remainder the metabolic workup was unremarkable as well. Her urine drug screen was negative.  Due to the worsening of her respiratory status and findings on EEG, the decision was made to proceed with intubation. This was performed using a #3 MacIntosh blade with RSI consisting of etomidate and succinylcholine. The cords were visualized and the endotracheal tube was easily placed. Tube confirmation was confirmed with end-tidal CO2 and auscultation over the lungs and stomach. Critical care will be called to admit the patient.  While the patient was in the emergency department, she arrived with a DO NOT RESUSCITATE order which was dated August 2016. I spoke to the patient's sister, Neoma Laming and multiple family members who were present at bedside. None of them were aware of this DO NOT RESUSCITATE order. Also when I reviewed the patient's record, there was no mention of DO NOT RESUSCITATE. On each H&P and discharge summary, codes status was documented as "full".  CRITICAL CARE Performed by: Veryl Speak Total critical care time: 70 minutes Critical care time was exclusive of separately billable procedures and treating other patients. Critical care was necessary to treat or prevent imminent or life-threatening deterioration. Critical care was time spent personally by me on the following activities: development of  treatment plan with patient and/or surrogate as well as nursing, discussions with consultants, evaluation of patient's response to treatment, examination of patient, obtaining history from patient or surrogate, ordering and performing treatments and interventions, ordering and review of laboratory studies, ordering and review of radiographic studies, pulse oximetry and re-evaluation of patient's condition.   Veryl Speak, MD 07/10/15 1623  Veryl Speak, MD 07/10/15 1635  Veryl Speak, MD 07/10/15 [phone]

## 2015-07-10 NOTE — Progress Notes (Signed)
ANTIBIOTIC CONSULT NOTE - INITIAL  Pharmacy Consult for Fortaz/Vanco Indication: UTI  Allergies  Allergen Reactions  . Sulfonamide Derivatives Hives and Shortness Of Breath  . Penicillins Hives and Itching    Has patient had a PCN reaction causing immediate rash, facial/tongue/throat swelling, SOB or lightheadedness with hypotension: unknown Has patient had a PCN reaction causing severe rash involving mucus membranes or skin necrosis: unknown Has patient had a PCN reaction that required hospitalization: unknown Has patient had a PCN reaction occurring within the last 10 years: unknown If all of the above answers are "NO", then may proceed with Cephalosporin use. Prior course of rocephin charted 05/2015    Patient Measurements:   Adjusted Body Weight:    Vital Signs: Temp: 97.9 F (36.6 C) (01/08 1048) Temp Source: Rectal (01/08 1048) BP: 113/65 mmHg (01/08 1830) Pulse Rate: 100 (01/08 1608) Intake/Output from previous day:   Intake/Output from this shift:    Labs:  Recent Labs  07/10/15 1124  WBC 6.1  HGB 11.7*  PLT 276  CREATININE 1.84*   Estimated Creatinine Clearance: 39.5 mL/min (by C-G formula based on Cr of 1.84). No results for input(s): VANCOTROUGH, VANCOPEAK, VANCORANDOM, GENTTROUGH, GENTPEAK, GENTRANDOM, TOBRATROUGH, TOBRAPEAK, TOBRARND, AMIKACINPEAK, AMIKACINTROU, AMIKACIN in the last 72 hours.   Microbiology: Recent Results (from the past 720 hour(s))  Urine culture     Status: None   Collection Time: 07/04/15  2:00 PM  Result Value Ref Range Status   Specimen Description URINE, CATHETERIZED  Final   Special Requests NONE  Final   Culture   Final    >=100,000 COLONIES/mL ACINETOBACTER CALCOACETICUS/BAUMANNII COMPLEX Performed at [facility]    Report Status 07/07/2015 FINAL  Final   Organism ID, Bacteria ACINETOBACTER CALCOACETICUS/BAUMANNII COMPLEX  Final      Susceptibility   Acinetobacter calcoaceticus/baumannii complex - MIC*     CEFTAZIDIME 4 SENSITIVE Sensitive     CEFTRIAXONE 16 INTERMEDIATE Intermediate     CIPROFLOXACIN <=0.25 SENSITIVE Sensitive     GENTAMICIN <=1 SENSITIVE Sensitive     IMIPENEM <=0.25 SENSITIVE Sensitive     PIP/TAZO <=4 SENSITIVE Sensitive     TRIMETH/SULFA <=20 SENSITIVE Sensitive     AMPICILLIN/SULBACTAM <=2 SENSITIVE Sensitive     * >=100,000 COLONIES/mL ACINETOBACTER CALCOACETICUS/BAUMANNII COMPLEX  Blood culture (routine x 2)     Status: None   Collection Time: 07/04/15  2:50 PM  Result Value Ref Range Status   Specimen Description BLOOD LEFT ARM  Final   Special Requests IN PEDIATRIC BOTTLE  3CC  Final   Culture   Final    NO GROWTH 5 DAYS Performed at [facility]    Report Status 07/09/2015 FINAL  Final  Blood culture (routine x 2)     Status: None   Collection Time: 07/04/15  2:55 PM  Result Value Ref Range Status   Specimen Description BLOOD LEFT ARM  Final   Special Requests BOTTLES DRAWN AEROBIC AND ANAEROBIC  10CC  Final   Culture   Final    NO GROWTH 5 DAYS Performed at [facility]    Report Status 07/09/2015 FINAL  Final  MRSA PCR Screening     Status: None   Collection Time: 07/04/15  5:15 PM  Result Value Ref Range Status   MRSA by PCR NEGATIVE NEGATIVE Final    Comment:        The GeneXpert MRSA Assay (FDA approved for NASAL specimens only), is one component of a comprehensive MRSA colonization surveillance program.  It is not intended to diagnose MRSA infection nor to guide or monitor treatment for MRSA infections.     Medical History: Past Medical History  Diagnosis Date  . MS (multiple sclerosis) ([place])     a. Dx'd late 20's. b. Tx with Novantrone, Tysabri, Copaxone previously.  Marland Kitchen Hypertension   . Depression   . Glaucoma   . Cardiomyopathy ([place])     a.  Echo 04/29/12: Mild LVH, EF 20-25%, mild AI, moderate MR, moderate LAE, mild RAE, mild RVE, moderate TR, PASP 51, small pericardial effusion;   b. probably non-ischemic given  multiple chemo-Tx agents used for MS and global LV dysfn on echo  . Chronic systolic heart failure ([facility])     Medications:  Prescriptions prior to admission  Medication Sig Dispense Refill Last Dose  . amantadine (SYMMETREL) 100 MG capsule Take 100 mg by mouth daily.    07/09/2015 at Unknown time  . baclofen (LIORESAL) 20 MG tablet Take 20 mg by mouth 3 (three) times daily.   07/09/2015 at Unknown time  . carvedilol (COREG) 12.5 MG tablet Take 1 tablet (12.5 mg total) by mouth 2 (two) times daily with a meal.   07/07/2015 at Unknown time  . Cholecalciferol (VITAMIN D-3 PO) Take 1 tablet by mouth daily.   07/09/2015 at Unknown time  . ciprofloxacin (CIPRO) 500 MG tablet Take 1 tablet (500 mg total) by mouth 2 (two) times daily. 14 tablet 0 07/09/2015 at Unknown time  . CRANBERRY PO Take 100 mg by mouth daily.   07/09/2015 at Unknown time  . Cyanocobalamin (VITAMIN B-12 PO) Take 1 tablet by mouth daily.   07/09/2015 at Unknown time  . docusate sodium (COLACE) 100 MG capsule Take 100 mg by mouth 2 (two) times daily.   07/09/2015 at Unknown time  . DULoxetine (CYMBALTA) 60 MG capsule Take 60 mg by mouth daily.   07/09/2015 at Unknown time  . furosemide (LASIX) 80 MG tablet Take 1 tablet (80 mg total) by mouth 2 (two) times daily. 60 tablet 0 07/09/2015 at Unknown time  . imipramine (TOFRANIL) 50 MG tablet Take 1.5 tablets (75 mg total) by mouth every evening. For depressive disorder 90 tablet 11 07/09/2015 at Unknown time  . oxyCODONE-acetaminophen (PERCOCET) 7.5-325 MG tablet Take 1 tablet by mouth every 4 (four) hours as needed for severe pain.   07/08/2015 at Unknown time  . Propylene Glycol (SYSTANE BALANCE) 0.6 % SOLN Place 1 drop into both eyes 2 (two) times daily.   07/09/2015 at Unknown time  . solifenacin (VESICARE) 10 MG tablet Take 10 mg by mouth daily. For bladder spasms   07/09/2015 at Unknown time  . spironolactone (ALDACTONE) 25 MG tablet Take 25 mg by mouth daily. For CHF   07/09/2015 at Unknown time  .  tamsulosin (FLOMAX) 0.4 MG CAPS capsule Take 0.4 mg by mouth daily. For incontinence   07/09/2015 at Unknown time  . acetaminophen (TYLENOL) 325 MG tablet Take 2 tablets (650 mg total) by mouth every 6 (six) hours as needed for mild pain or fever (or Fever >/= 101). 30 tablet 0 unknown at unknown  . feeding supplement, ENSURE ENLIVE, (ENSURE ENLIVE) LIQD Take 237 mLs by mouth 2 (two) times daily as needed (Please provide to pt if she she eats less than 50% of breakfast and dinner). 237 mL 12 unknown at unknown  . gabapentin (NEURONTIN) 300 MG capsule Take 3 capsules (900 mg total) by mouth 3 (three) times daily. (Patient not taking: Reported on  07/10/2015) 90 capsule 5   . latanoprost (XALATAN) 0.005 % ophthalmic solution Place 1 drop into both eyes at bedtime.   07/03/2015 at Unknown time  . magnesium hydroxide (MILK OF MAGNESIA) 400 MG/5ML suspension Take 30 mLs by mouth every 8 (eight) hours as needed for mild constipation or moderate constipation.   unknown at unknown  . ondansetron (ZOFRAN) 4 MG tablet Take 1 tablet (4 mg total) by mouth every 6 (six) hours as needed for nausea. 20 tablet 0 unknown at unknown  . polyethylene glycol (MIRALAX / GLYCOLAX) packet Take 17 g by mouth 2 (two) times daily. (Patient taking differently: Take 17 g by mouth daily as needed for mild constipation. ) 120 packet 11 unknown at unknown  . potassium chloride (KLOR-CON) 20 MEQ packet Take 20 mEq by mouth daily. (Patient not taking: Reported on 07/10/2015)   07/03/2015 at Unknown time   Assessment: Acute encephalopathy  55 y/o F with a h/o MS and multiple admissions for CAUTI admitted from NH nearly unresponsive with acute encephalopathy. She was just discharged from [facility] on 1/6 after being treated for a UTI from acinetobacter (d/c'd on Cipro).   ID: Afebrile. WBC 6.1. Scr 0.95>>1.84 in the last 5 days. Patient will require interventional radiology for LP.  Acyclovir 1/8>> Rocephin 1/8 x 1 Vancomycin  1/8>> Tressie Ellis 1/8>>   Goal of Therapy:  Vancomycin trough level 15-20 mcg/ml  Eradication of infection   Plan:  Vancomycin 1g in ED, then 750mg  IV q12h. Vanco trough after 3-5 doses at steady state. Fortaz 2g IV q12h Monitor renal function and UOP for dose adjustments   Devann Cribb S. Alford Highland, PharmD, BCPS Clinical Staff Pharmacist Pager [phone]  Eilene Ghazi Stillinger 07/10/2015,8:27 PM

## 2015-07-10 NOTE — ED Notes (Signed)
Dr. Stark Jock attempted to call family , he left a message.

## 2015-07-10 NOTE — ED Notes (Signed)
Patient transported to CT 

## 2015-07-11 ENCOUNTER — Inpatient Hospital Stay (HOSPITAL_COMMUNITY): Payer: Medicare Other

## 2015-07-11 ENCOUNTER — Other Ambulatory Visit: Payer: Self-pay | Admitting: *Deleted

## 2015-07-11 DIAGNOSIS — J9601 Acute respiratory failure with hypoxia: Secondary | ICD-10-CM | POA: Insufficient documentation

## 2015-07-11 DIAGNOSIS — J9600 Acute respiratory failure, unspecified whether with hypoxia or hypercapnia: Secondary | ICD-10-CM

## 2015-07-11 LAB — CSF CELL COUNT WITH DIFFERENTIAL
RBC Count, CSF: 129 /mm3 — ABNORMAL HIGH
TUBE #: 1
WBC, CSF: 1 /mm3 (ref 0–5)

## 2015-07-11 LAB — PROTEIN, CSF: TOTAL PROTEIN, CSF: 41 mg/dL (ref 15–45)

## 2015-07-11 LAB — CBC
HEMATOCRIT: 34.2 % — AB (ref 36.0–46.0)
HEMOGLOBIN: 11.2 g/dL — AB (ref 12.0–15.0)
MCH: 27.1 pg (ref 26.0–34.0)
MCHC: 32.7 g/dL (ref 30.0–36.0)
MCV: 82.6 fL (ref 78.0–100.0)
Platelets: 263 10*3/uL (ref 150–400)
RBC: 4.14 MIL/uL (ref 3.87–5.11)
RDW: 16 % — ABNORMAL HIGH (ref 11.5–15.5)
WBC: 9.9 10*3/uL (ref 4.0–10.5)

## 2015-07-11 LAB — MAGNESIUM: Magnesium: 1.9 mg/dL (ref 1.7–2.4)

## 2015-07-11 LAB — BASIC METABOLIC PANEL
ANION GAP: 13 (ref 5–15)
BUN: 28 mg/dL — ABNORMAL HIGH (ref 6–20)
CO2: 23 mmol/L (ref 22–32)
Calcium: 9.1 mg/dL (ref 8.9–10.3)
Chloride: 112 mmol/L — ABNORMAL HIGH (ref 101–111)
Creatinine, Ser: 1.4 mg/dL — ABNORMAL HIGH (ref 0.44–1.00)
GFR calc non Af Amer: 42 mL/min — ABNORMAL LOW (ref >60)
GFR, EST AFRICAN AMERICAN: 49 mL/min — AB (ref >60)
GLUCOSE: 166 mg/dL — AB (ref 65–99)
POTASSIUM: 3.2 mmol/L — AB (ref 3.5–5.1)
Sodium: 148 mmol/L — ABNORMAL HIGH (ref 135–145)

## 2015-07-11 LAB — GLUCOSE, CSF: GLUCOSE CSF: 88 mg/dL — AB (ref 40–70)

## 2015-07-11 LAB — GLUCOSE, CAPILLARY
GLUCOSE-CAPILLARY: 123 mg/dL — AB (ref 65–99)
GLUCOSE-CAPILLARY: 115 mg/dL — AB (ref 65–99)

## 2015-07-11 LAB — PHOSPHORUS: PHOSPHORUS: 2.9 mg/dL (ref 2.5–4.6)

## 2015-07-11 LAB — PHENYTOIN LEVEL, TOTAL: Phenytoin Lvl: 13.8 ug/mL (ref 10.0–20.0)

## 2015-07-11 LAB — MRSA PCR SCREENING: MRSA by PCR: NEGATIVE

## 2015-07-11 MED ORDER — HEPARIN SODIUM (PORCINE) 5000 UNIT/ML IJ SOLN
5000.0000 [IU] | Freq: Three times a day (TID) | INTRAMUSCULAR | Status: DC
  Administered 2015-07-11 – 2015-07-25 (×41): 5000 [IU] via SUBCUTANEOUS
  Filled 2015-07-11 (×41): qty 1

## 2015-07-11 MED ORDER — SODIUM CHLORIDE 0.9 % IV SOLN
400.0000 mg | INTRAVENOUS | Status: AC
  Administered 2015-07-11: 400 mg via INTRAVENOUS
  Filled 2015-07-11: qty 40

## 2015-07-11 MED ORDER — OXYCODONE-ACETAMINOPHEN 5-325 MG PO TABS: ORAL_TABLET | ORAL | Status: DC

## 2015-07-11 MED ORDER — SODIUM CHLORIDE 0.9 % IV SOLN
200.0000 mg | Freq: Two times a day (BID) | INTRAVENOUS | Status: DC
  Administered 2015-07-11: 200 mg via INTRAVENOUS
  Filled 2015-07-11 (×2): qty 20

## 2015-07-11 MED ORDER — VITAL HIGH PROTEIN PO LIQD
1000.0000 mL | ORAL | Status: DC
  Administered 2015-07-12 – 2015-07-16 (×5): 1000 mL

## 2015-07-11 MED ORDER — LORAZEPAM 2 MG/ML IJ SOLN
2.0000 mg | Freq: Once | INTRAMUSCULAR | Status: AC
  Administered 2015-07-11: 2 mg via INTRAVENOUS

## 2015-07-11 MED ORDER — DEXTROSE 5 % IV SOLN
10.0000 mg/kg | Freq: Three times a day (TID) | INTRAVENOUS | Status: DC
  Administered 2015-07-12: 500 mg via INTRAVENOUS
  Filled 2015-07-11 (×3): qty 10

## 2015-07-11 MED ORDER — LORAZEPAM 2 MG/ML IJ SOLN
INTRAMUSCULAR | Status: AC
  Filled 2015-07-11: qty 1

## 2015-07-11 MED ORDER — DEXTROSE 5 % IV SOLN
10.0000 mg/kg | Freq: Three times a day (TID) | INTRAVENOUS | Status: DC
  Filled 2015-07-11: qty 10

## 2015-07-11 MED ORDER — LORAZEPAM 2 MG/ML IJ SOLN: 2.0000 mg | Freq: Once | INTRAMUSCULAR | Status: DC

## 2015-07-11 MED ORDER — POTASSIUM CHLORIDE 10 MEQ/100ML IV SOLN
10.0000 meq | INTRAVENOUS | Status: AC
  Administered 2015-07-11 (×3): 10 meq via INTRAVENOUS
  Filled 2015-07-11 (×3): qty 100

## 2015-07-11 MED ORDER — SODIUM CHLORIDE 0.9 % IV SOLN
250.0000 mg | Freq: Once | INTRAVENOUS | Status: AC
  Administered 2015-07-11: 250 mg via INTRAVENOUS
  Filled 2015-07-11: qty 5

## 2015-07-11 NOTE — Progress Notes (Signed)
ETT advanced 1.5cm per NP verbal order. Tube is now placed 25 at the lips. RT will continue to monitor.

## 2015-07-11 NOTE — Telephone Encounter (Signed)
[place]

## 2015-07-11 NOTE — Progress Notes (Signed)
Initial Nutrition Assessment  DOCUMENTATION CODES:   Obesity unspecified  INTERVENTION:   Initiate Vital High Protein at 20 ml/hr and increase by 10 ml every 4 hours to goal rate of 50 ml/hr  TF regimen to provide 1200 kcals, 105 gm protein, 1003 ml of free water  NUTRITION DIAGNOSIS:   Inadequate oral intake related to inability to eat as evidenced by NPO status  GOAL:   Provide needs based on ASPEN/SCCM guidelines  MONITOR:   TF tolerance, Vent status, Labs, Weight trends, I & O's  REASON FOR ASSESSMENT:   Consult Enteral/tube feeding initiation and management  ASSESSMENT:   55 y/o Female with a history of MS and multiple admissions for foley catheter associated UTI was admitted from [facility] hospital on 07/09/2014 after being brought in by EMS from her SNF. She was just discharged from [facility] on 1/6 after being treated for a UTI from acinetobacter. She was discharged on Cipro. Today she was found nearly unresponsive in her SNF. No family at bedside, no one available to provide history.   Patient is currently intubated on ventilator support -- OGT in place Temp (24hrs), Avg:96.7 F (35.9 C), Min:95.7 F (35.4 C), Max:97.6 F (36.4 C)   Nutrition focused physical exam completed.  No muscle or subcutaneous fat depletion noticed.  CCM note reviewed.  No plans for extubation until MS clears.  Neurology note reviewed.  LP per Radiology today.  Diet Order:   NPO  Skin:  Wound (see comment) (MSAD to bilateral buttocks)  Last BM:  N/A  Height:   Ht Readings from Last 1 Encounters:  07/10/15 5\' 2"  (1.575 m)    Weight:   Wt Readings from Last 1 Encounters:  07/11/15 201 lb 1 oz (91.2 kg)    Ideal Body Weight:  50 kg  BMI:  Body mass index is 36.76 kg/(m^2).  Estimated Nutritional Needs:   Kcal:  [id number]  Protein:  100-110 gm  Fluid:  per MD  EDUCATION NEEDS:   No education needs identified at this time  Arthur Holms, RD, LDN Pager  #: [phone] After-Hours Pager #: [phone]

## 2015-07-11 NOTE — Procedures (Signed)
History: 55 yo F with encephalopathy  Sedation: Ativan during the EEG  Technique: This is a 21 channel routine scalp EEG performed at the bedside with bipolar and monopolar montages arranged in accordance to the international 10/20 system of electrode placement. One channel was dedicated to EKG recording.    Background: There are frequent bifrontally predominant sharp wave discharges, occasionally with triphasic morphology. These at times achieve a frequency of 2.5 - 3Hz . These have a shifting bifrontal predominance, but at times appear to be unilateral, either right or left sided.  Following ativan and vimpat administration, these discharges become much less frequent. There are some periods of rhythmic delta activity which   Photic stimulation: Physiologic driving is not performed  EEG Abnormalities:  1) Generalized periodic discharges as described above most consistent with triphasic waves.  2) Generalized irregular slow activity.   Clinical Interpretation: This EEG is most consistent with a moderate - severe generalized cerebral dysfunction(encephalopathy).   Triphasic waves are typically non-epileptic in nature.  The frequency of the generalized discharges as well as the sharp wave morphology at times are concerning that this might instead have an epileptiform nature, however. Improvement following treatment unfortunately can occur with wither non-epileptic triphasic waves or improvement in epileptiform discharges. Clinical correlation is recommended.   Roland Rack, MD [facility] [phone]  If 7pm- 7am, please page neurology on call as listed in [place].

## 2015-07-11 NOTE — Progress Notes (Signed)
Prolonged EEG completed.  Results pending. 

## 2015-07-11 NOTE — Procedures (Signed)
Witnessed informed consent was obtained from the patient's sister prior to the procedure (by phone as patient not able to give consent and patient is daughter is not available), including potential complications of headache, bleeding, allergy, and pain. With the patient prone and on a ventilator, the lower back was prepped with Betadine. 1% Lidocaine was used for local anesthesia. Lumbar puncture was performed at the L2-3 level using a 22 gauge needle with return of clear CSF with an opening pressure of 11 cm water. Nine ml of CSF were obtained for laboratory studies. The patient tolerated the procedure well and there were no apparent complications.

## 2015-07-11 NOTE — Progress Notes (Signed)
[facility] ADULT ICU REPLACEMENT PROTOCOL FOR AM LAB REPLACEMENT ONLY  The patient does not apply for the [facility] Adult ICU Electrolyte Replacment Protocol based on the criteria listed below:   1. Is GFR >/= 40 ml/min? Yes.    Patient's GFR today is 49 2. Is urine output >/= 0.5 ml/kg/hr for the last 6 hours? Yes.   Patient's UOP is 0.2 ml/kg/hr 3. Is BUN < 60 mg/dL? Yes.    Patient's BUN today is 28 4. Abnormal electrolyte(s): K+3.2 5. Ordered repletion with: NA 6. If a panic level lab has been reported, has the CCM MD in charge been notified? Yes.  .   Physician:  Arlester Marker, [place] 07/11/2015 3:53 AM

## 2015-07-11 NOTE — Progress Notes (Signed)
Subjective: Not following commands.   Exam: Filed Vitals:   07/11/15 0700 07/11/15 0800  BP: 136/86 147/83  Pulse: 85 88  Temp:    Resp: 17 18   Gen: In bed, NAD Resp: non-labored breathing, no acute distress Abd: soft, nt  Neuro: MS: opens eyes to voice. Does nto follow commands.  [id number] to threat from the left, not right. Blinks to eyelid stim bilaterally.  Motor: increased tone L> R flexion vs withdrawal in all 4 ext.  Sensory:as above.   Pertinent Labs: Mild hypernatremia Hyperglycemia(166) Mildly elevated creatinine  Impression: 55 yo F with acute encephalopathy after recent discharge with treatment for acinetobacter UTI. She had epileptiform discharges(not seizure) and was started on dilantin as well as received a single dose of keppra. I agree with attempting CSF sampling, though lack of fever does lower concern for infection some, with history of chronic illness, may not mount as robust a response to infection.   Recommendations: 1) LP by radiology(attempted at bedside by ED physician) 2) dilantin level 3) continue empiric abx pending CSF sampling.  4) MRI brain w/wo contrast.   Roland Rack, MD [facility] [phone]  If 7pm- 7am, please page neurology on call as listed in [place].

## 2015-07-11 NOTE — Progress Notes (Signed)
PULMONARY / CRITICAL CARE MEDICINE   Name: Shannon Garrett MRN: [id number] DOB: 27-Feb-1961    ADMISSION DATE:  07/10/2015 CONSULTATION DATE:  07/10/2015  REFERRING MD:  Stark Jock  CHIEF COMPLAINT:  Acute encephalopathy  HISTORY OF PRESENT ILLNESS:   55 y/o female with a history of MS and multiple admissions for foley catheter associated UTI was admitted from [facility] hospital on 07/09/2014 after being brought in by EMS from her SNF.  She was just discharged from [facility] on 1/6 after being treated for a UTI from acinetobacter.  She was discharged on Cipro.  Today she was found nearly unresponsive in her SNF.  No family at bedside, no one available to provide history.     SUBJECTIVE:  No significant changes in her MS reported.  No sedation given  VITAL SIGNS: BP 147/83 mmHg  Pulse 81  Temp(Src) 97 F (36.1 C) (Axillary)  Resp 19  Ht 5\' 2"  (1.575 m)  Wt 91.2 kg (201 lb 1 oz)  BMI 36.76 kg/m2  SpO2 100%  HEMODYNAMICS:    VENTILATOR SETTINGS: Vent Mode:  [-] PRVC FiO2 (%):  [40 %-100 %] 40 % Set Rate:  [16 bmp] 16 bmp Vt Set:  [450 mL] 450 mL PEEP:  [5 cmH20] 5 cmH20 Plateau Pressure:  [14 cmH20-19 cmH20] 14 cmH20  INTAKE / OUTPUT: I/O last 3 completed shifts: In: 2826 [I.V.:2116; NG/GT:90; IV Piggyback:620] Out: 920 [Urine:770; Emesis/NG output:150]  PHYSICAL EXAMINATION: General:  Obese, on vent Neuro:  Off propofol, will grimace w pain, opens eyes, no spontaneous movement, not following commands.  HEENT:  NCAT, ETTin place, Pupils sluggish, minimal response to light Cardiovascular:  RRR, no mgr Lungs:  CTA B Abdomen:  BS+, soft, non tender  Musculoskeletal:  Diminished tone but sedated Skin:  Edema all over, no breakdown anteriorly, could not examine sacrum  LABS:  BMET  Recent Labs Lab 07/05/15 0355 07/10/15 1124 07/11/15 0240  NA 143 146* 148*  K 3.6 3.5 3.2*  CL 107 106 112*  CO2 26 27 23   BUN 19 31* 28*  CREATININE 0.95 1.84* 1.40*  GLUCOSE 119*  130* 166*    Electrolytes  Recent Labs Lab 07/05/15 0355 07/10/15 1124 07/11/15 0240  CALCIUM 8.5* 9.7 9.1  MG  --   --  1.9  PHOS  --   --  2.9    CBC  Recent Labs Lab 07/05/15 0355 07/10/15 1124 07/11/15 0240  WBC 7.1 6.1 9.9  HGB 10.9* 11.7* 11.2*  HCT 33.2* 36.0 34.2*  PLT 307 276 263    Coag's  Recent Labs Lab 07/04/15 1852  APTT 36  INR 1.27    Sepsis Markers  Recent Labs Lab 07/04/15 1227  07/04/15 2150 07/10/15 1507 07/10/15 2020  LATICACIDVEN  --   < > 1.9 0.7 1.1  PROCALCITON <0.10  --   --   --  <0.10  < > = values in this interval not displayed.  ABG  Recent Labs Lab 07/10/15 1524 07/10/15 1801  PHART 7.336* 7.420  PCO2ART 56.7* 41.2  PO2ART 412.0* 417.0*    Liver Enzymes  Recent Labs Lab 07/04/15 1227 07/10/15 1507  AST 20 17  ALT 30 31  ALKPHOS 216* 166*  BILITOT 0.8 0.6  ALBUMIN 4.1 3.6    Cardiac Enzymes No results for input(s): TROPONINI, PROBNP in the last 168 hours.  Glucose No results for input(s): GLUCAP in the last 168 hours.  Imaging Ct Head Wo Contrast  07/10/2015  CLINICAL DATA:  Patient with possible aspiration. Possible change in mental status. EXAM: CT HEAD WITHOUT CONTRAST TECHNIQUE: Contiguous axial images were obtained from the base of the skull through the vertex without intravenous contrast. COMPARISON:  Brain CT 07/04/2015. FINDINGS: Ventricles and sulci are prominent compatible with atrophy. Periventricular and subcortical white matter hypodensity compatible with chronic white matter disease. No evidence for acute cortically based infarct, intracranial hemorrhage, mass lesion or mass-effect. Orbits are unremarkable. Mucosal thickening within the ethmoid air cells and frontal sinus. Mastoid air cells are unremarkable. Calvarium is intact. IMPRESSION: No acute intracranial process. Atrophy and chronic white matter disease. Electronically Signed   By: Lovey Newcomer M.D.   On: 07/10/2015 12:16   Dg Chest  Port 1 View  07/10/2015  CLINICAL DATA:  Post intubation with history of hypertension EXAM: PORTABLE CHEST 1 VIEW COMPARISON:  07/10/2015 FINDINGS: Endotracheal tube tip is 5.8 cm above the carina. Tip dislocated above the clavicle level. Lungs are clear. Mild cardiac enlargement stable. IMPRESSION: Endotracheal tube as described Electronically Signed   By: Skipper Cliche M.D.   On: 07/10/2015 16:27   Dg Chest Port 1 View  07/10/2015  CLINICAL DATA:  Loss of consciousness.  Possible aspiration. EXAM: PORTABLE CHEST 1 VIEW COMPARISON:  Chest radiograph 07/04/2015. FINDINGS: Patient is rotated. Stable cardiac and mediastinal contours. Low lung volumes. No consolidative pulmonary opacities. No pleural effusion or pneumothorax. Regional skeleton is unremarkable. IMPRESSION: No acute cardiopulmonary process. Electronically Signed   By: Lovey Newcomer M.D.   On: 07/10/2015 10:48     STUDIES:  1/8 CT head > no acute changes  CULTURES: 1/8 Blood X2 >   ANTIBIOTICS: Vanc 1/8 >  Ceftriaxone x1 1/8 Ceftaz 1/8 >  Acyclovir 1/8 >   SIGNIFICANT EVENTS:   LINES/TUBES: 1/8 ETT >   DISCUSSION: 55 y.o female with multiple sclerosis who lives in a SNF and has recurrent UTI's was admitted from the [facility] ER with acute encephalopathy of uncertain etiology.  Ddx includes polypharmacy, cipro effect, non-convulsive status epilepticus, no clear etiology right now.  Doesn't appear septic.  Neuro work up pending.  ASSESSMENT / PLAN:  PULMONARY A: Acute respiratory failure with hypoxemia P:   Full vent support vap bundle No plans extubate until MS clears. Her weakness due to multiple sclerosis will complicate weaning and extubation.   CARDIOVASCULAR A:  No acute issues P:  Tele   RENAL A:   Hypernatremia AKI uncertain etiology, improving P:   Renal dose meds IVF Monitor BMET and UOP Replace electrolytes as needed   GASTROINTESTINAL A:   No acute issues P:   OG tube pepcid Tube  feedings  HEMATOLOGIC A:   No acute issues P:  Monitor for bleeding dvt prophylaxis  INFECTIOUS A:   Recurrent UTI, pyuria on admission, recent acetobacter P:   Cultures/antibiotics as above LP planned per neuro  ENDOCRINE A:   No acute issues   P:   Monitor glucose  NEUROLOGIC A:   Acute encephalopathy, see discussion above; favor polypharmacy at play here but picture not clear P:   Hold most home meds Keep baclofen  EEG per neuro LP per neuro, planned for 1/9 MRI ordered and pending RASS goal: 0 Intermittent fentanyl    FAMILY  - Updates: Updated at bedside 1/9  - Inter-disciplinary family meet or Palliative Care meeting due by:  07/16/14  My cc time 35 minutes  Baltazar Apo, MD, PhD 07/11/2015, 10:57 AM [facility] [phone] or if no answer [phone]

## 2015-07-11 NOTE — Progress Notes (Signed)
Prolonged EEG started. 

## 2015-07-11 NOTE — Progress Notes (Signed)
[facility] Progress Note Patient Name: Shannon Garrett DOB: 14-Nov-1960 MRN: [id number]   Date of Service  07/11/2015  HPI/Events of Note  RN thinks picc line needed  eICU Interventions  picco     Intervention Category Intermediate Interventions: Other:  Sherion Dooly 07/11/2015, 7:40 PM

## 2015-07-11 NOTE — Progress Notes (Signed)
I discussed with Dr. Jeannine Kitten of radiology who indicates that her next of kin has not been able to be reached. Given the abnormal findings on EEG and concern for possible infectious or other inflammatory causes of her presentation, I do feel that this is an emergent procedure. I discussed this with Dr. Jeannine Kitten and absent consent, I think that proceeding would be in the best interest of the patient.   Roland Rack, MD [facility] [phone]  If 7pm- 7am, please page neurology on call as listed in [place].

## 2015-07-11 NOTE — Progress Notes (Signed)
[facility] Progress Note Patient Name: Sameka Meenach DOB: Aug 31, 1960 MRN: [id number]   Date of Service  07/11/2015  HPI/Events of Note  K+ = 3.2 and Creatinine = 1.4.  eICU Interventions  Will replete K+.     Intervention Category Intermediate Interventions: Electrolyte abnormality - evaluation and management  Sommer,Steven Eugene 07/11/2015, 4:07 AM

## 2015-07-12 ENCOUNTER — Inpatient Hospital Stay (HOSPITAL_COMMUNITY): Payer: Medicare Other

## 2015-07-12 DIAGNOSIS — R4182 Altered mental status, unspecified: Secondary | ICD-10-CM | POA: Insufficient documentation

## 2015-07-12 LAB — CBC
HEMATOCRIT: 36.1 % (ref 36.0–46.0)
HEMOGLOBIN: 11.9 g/dL — AB (ref 12.0–15.0)
MCH: 27 pg (ref 26.0–34.0)
MCHC: 33 g/dL (ref 30.0–36.0)
MCV: 81.9 fL (ref 78.0–100.0)
Platelets: 273 10*3/uL (ref 150–400)
RBC: 4.41 MIL/uL (ref 3.87–5.11)
RDW: 16.3 % — ABNORMAL HIGH (ref 11.5–15.5)
WBC: 10 10*3/uL (ref 4.0–10.5)

## 2015-07-12 LAB — CORTISOL: Cortisol, Plasma: 1.4 ug/dL

## 2015-07-12 LAB — GLUCOSE, CAPILLARY
GLUCOSE-CAPILLARY: 117 mg/dL — AB (ref 65–99)
GLUCOSE-CAPILLARY: 125 mg/dL — AB (ref 65–99)
GLUCOSE-CAPILLARY: 136 mg/dL — AB (ref 65–99)
GLUCOSE-CAPILLARY: 139 mg/dL — AB (ref 65–99)
Glucose-Capillary: 87 mg/dL (ref 65–99)
Glucose-Capillary: 116 mg/dL — ABNORMAL HIGH (ref 65–99)

## 2015-07-12 LAB — BASIC METABOLIC PANEL
Anion gap: 15 (ref 5–15)
BUN: 23 mg/dL — AB (ref 6–20)
CHLORIDE: 113 mmol/L — AB (ref 101–111)
CO2: 20 mmol/L — AB (ref 22–32)
Calcium: 9.3 mg/dL (ref 8.9–10.3)
Creatinine, Ser: 1.15 mg/dL — ABNORMAL HIGH (ref 0.44–1.00)
GFR calc Af Amer: >60 mL/min (ref >60)
GFR, EST NON AFRICAN AMERICAN: 54 mL/min — AB (ref >60)
GLUCOSE: 107 mg/dL — AB (ref 65–99)
Potassium: 3.5 mmol/L (ref 3.5–5.1)
Sodium: 148 mmol/L — ABNORMAL HIGH (ref 135–145)

## 2015-07-12 LAB — HERPES SIMPLEX VIRUS(HSV) DNA BY PCR
HSV 1 DNA: NEGATIVE
HSV 2 DNA: NEGATIVE

## 2015-07-12 MED ORDER — PIPERACILLIN-TAZOBACTAM 3.375 G IVPB
3.3750 g | Freq: Three times a day (TID) | INTRAVENOUS | Status: DC
  Administered 2015-07-12 – 2015-07-16 (×12): 3.375 g via INTRAVENOUS
  Filled 2015-07-12 (×13): qty 50

## 2015-07-12 MED ORDER — BACLOFEN 10 MG PO TABS
10.0000 mg | ORAL_TABLET | Freq: Three times a day (TID) | ORAL | Status: DC
  Administered 2015-07-12 – 2015-07-15 (×12): 10 mg via ORAL
  Filled 2015-07-12 (×12): qty 1

## 2015-07-12 MED ORDER — HYDRALAZINE HCL 20 MG/ML IJ SOLN
20.0000 mg | Freq: Once | INTRAMUSCULAR | Status: AC
  Administered 2015-07-12: 20 mg via INTRAVENOUS
  Filled 2015-07-12: qty 1

## 2015-07-12 MED ORDER — CARVEDILOL 6.25 MG PO TABS
18.7500 mg | ORAL_TABLET | Freq: Two times a day (BID) | ORAL | Status: DC
  Administered 2015-07-12 – 2015-07-15 (×8): 18.75 mg via ORAL
  Filled 2015-07-12 (×8): qty 1

## 2015-07-12 MED ORDER — LACOSAMIDE 200 MG/20ML IV SOLN
100.0000 mg | Freq: Two times a day (BID) | INTRAVENOUS | Status: DC
  Administered 2015-07-12 – 2015-07-14 (×6): 100 mg via INTRAVENOUS
  Filled 2015-07-12 (×12): qty 10

## 2015-07-12 MED ORDER — SPIRONOLACTONE 25 MG PO TABS
25.0000 mg | ORAL_TABLET | Freq: Every day | ORAL | Status: DC
  Administered 2015-07-12 – 2015-07-22 (×11): 25 mg
  Filled 2015-07-12 (×11): qty 1

## 2015-07-12 NOTE — Progress Notes (Signed)
Pharmacy Antibiotic Follow-up Note  Shannon Garrett is a 55 y.o. year-old female admitted on 07/10/2015.  The patient is currently on day 3 of fortaz for recurrent UTI.She is noted with encephalopathy and pharmacy consulted to change to zosyn (avoiding cephalosporins and quinolones).  Blood culture noted w/ CONS 1/2- likely contaminant. -WBC= 10, afeb, SCr= 1.15, CrCl ~ 60   Plan: -Zosyn 3.357gm IV q8h -Will follow renal function, cultures and clinical progress    Temp (24hrs), Avg:97.4 F (36.3 C), Min:96.9 F (36.1 C), Max:98 F (36.7 C)   Recent Labs Lab 07/10/15 1124 07/11/15 0240 07/12/15 0320  WBC 6.1 9.9 10.0    Recent Labs Lab 07/10/15 1124 07/11/15 0240 07/12/15 0320  CREATININE 1.84* 1.40* 1.15*   Estimated Creatinine Clearance: 60 mL/min (by C-G formula based on Cr of 1.15).    Allergies  Allergen Reactions  . Sulfonamide Derivatives Hives and Shortness Of Breath  . Penicillins Hives and Itching    Has patient had a PCN reaction causing immediate rash, facial/tongue/throat swelling, SOB or lightheadedness with hypotension: unknown Has patient had a PCN reaction causing severe rash involving mucus membranes or skin necrosis: unknown Has patient had a PCN reaction that required hospitalization: unknown Has patient had a PCN reaction occurring within the last 10 years: unknown If all of the above answers are "NO", then may proceed with Cephalosporin use. Prior course of rocephin charted 05/2015    Antimicrobials this admission: Acyclovir 1/8>> 1/10 Rocephin 1/8 x 1 Vancomycin 1/8>> 1/10  Tressie Ellis 1/8>> 1/10  Levels/dose changes this admission: n/a  Microbiology results: 1/8 BC x 2- CONS x1 1/9 LP pending  Thank you for allowing pharmacy to be a part of this patient's care.  Dareen Piano PharmD 07/12/2015 10:23 AM

## 2015-07-12 NOTE — Progress Notes (Signed)
[facility] Progress Note Patient Name: Makyrah Weech DOB: Feb 11, 1961 MRN: [id number]   Date of Service  07/12/2015  HPI/Events of Note  Continued rise in sbp to the 170s  eICU Interventions  Hydralazine 20mg  x 1 given     Intervention Category Intermediate Interventions: Hypertension - evaluation and management  Jessa Stinson 07/12/2015, 3:49 PM

## 2015-07-12 NOTE — Progress Notes (Signed)
PULMONARY / CRITICAL CARE MEDICINE   Name: Shannon Garrett MRN: [id number] DOB: 1960/08/15    ADMISSION DATE:  07/10/2015 CONSULTATION DATE:  07/10/2015  REFERRING MD:  Stark Jock  CHIEF COMPLAINT:  Acute encephalopathy  HISTORY OF PRESENT ILLNESS:   55 y/o female with a history of MS and multiple admissions for foley catheter associated UTI was admitted from [facility] hospital on 07/09/2014 after being brought in by EMS from her SNF.  She was just discharged from [facility] on 1/6 after being treated for a UTI from acinetobacter.  She was discharged on Cipro.  Today she was found nearly unresponsive in her SNF.  No family at bedside, no one available to provide history.     SUBJECTIVE:  No change in her neuro exam am 1/9 No clear seizure activity on her EEG LP bland   VITAL SIGNS: BP 164/88 mmHg  Pulse 80  Temp(Src) 98 F (36.7 C) (Axillary)  Resp 16  Ht 5\' 2"  (1.575 m)  Wt 90.9 kg (200 lb 6.4 oz)  BMI 36.64 kg/m2  SpO2 100%  HEMODYNAMICS:    VENTILATOR SETTINGS: Vent Mode:  [-] PRVC FiO2 (%):  [40 %] 40 % Set Rate:  [16 bmp] 16 bmp Vt Set:  [450 mL] 450 mL PEEP:  [5 cmH20] 5 cmH20 Plateau Pressure:  [13 cmH20-21 cmH20] 19 cmH20  INTAKE / OUTPUT: I/O last 3 completed shifts: In: [id number] [I.V.:3416; NG/GT:160; IV Piggyback:1225] Out: [id number] [Urine:3490; Emesis/NG output:150]  PHYSICAL EXAMINATION: General:  Obese, on vent Neuro:  will grimace w pain, opens eyes, no spontaneous movement, not following commands.  HEENT:  NCAT, ETTin place, Pupils sluggish, minimal response to light Cardiovascular:  RRR, no mgr Lungs:  CTA B Abdomen:  BS+, soft, non tender  Musculoskeletal:  Diminished tone but sedated Skin:  Edema all over, no breakdown anteriorly, could not examine sacrum  LABS:  BMET  Recent Labs Lab 07/10/15 1124 07/11/15 0240 07/12/15 0320  NA 146* 148* 148*  K 3.5 3.2* 3.5  CL 106 112* 113*  CO2 27 23 20*  BUN 31* 28* 23*  CREATININE 1.84* 1.40* 1.15*   GLUCOSE 130* 166* 107*    Electrolytes  Recent Labs Lab 07/10/15 1124 07/11/15 0240 07/12/15 0320  CALCIUM 9.7 9.1 9.3  MG  --  1.9  --   PHOS  --  2.9  --     CBC  Recent Labs Lab 07/10/15 1124 07/11/15 0240 07/12/15 0320  WBC 6.1 9.9 10.0  HGB 11.7* 11.2* 11.9*  HCT 36.0 34.2* 36.1  PLT 276 263 273    Coag's No results for input(s): APTT, INR in the last 168 hours.  Sepsis Markers  Recent Labs Lab 07/10/15 1507 07/10/15 2020  LATICACIDVEN 0.7 1.1  PROCALCITON  --  <0.10    ABG  Recent Labs Lab 07/10/15 1524 07/10/15 1801  PHART 7.336* 7.420  PCO2ART 56.7* 41.2  PO2ART 412.0* 417.0*    Liver Enzymes  Recent Labs Lab 07/10/15 1507  AST 17  ALT 31  ALKPHOS 166*  BILITOT 0.6  ALBUMIN 3.6    Cardiac Enzymes No results for input(s): TROPONINI, PROBNP in the last 168 hours.  Glucose  Recent Labs Lab 07/11/15 1256 07/11/15 1649 07/12/15 0030 07/12/15 0406  GLUCAP 123* 115* 117* 72    Imaging Mr Brain W Wo Contrast  07/11/2015  CLINICAL DATA:  55 year old hypertensive female with multiple sclerosis and mental status changes. Subsequent encounter. EXAM: MRI HEAD WITHOUT AND WITH CONTRAST TECHNIQUE: Multiplanar, multiecho  pulse sequences of the brain and surrounding structures were obtained without and with intravenous contrast. CONTRAST:  19 cc MultiHance. COMPARISON:  07/10/2015 head CT. 06/08/2015, 10/08/2014 and 09/04/2012 brain MR. FINDINGS: Exam is motion degraded. No acute infarct or intracranial hemorrhage. Prominent white matter changes consistent with patient's history multiple sclerosis. No significant change from prior exams. No interval growth to suggest progressive multifocal leukoencephalopathy. No restricted motion or enhancement to suggest active demyelination. 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 Infundibulum remains deviated to the left. No other intracranial mass or abnormal enhancement. Nonspecific 1  cm right parotid lesion unchanged. Global atrophy without hydrocephalus. Mild exophthalmos. Minimal mucosal thickening ethmoid sinus air cells. Major intracranial vascular structures are patent. IMPRESSION: Exam is motion degraded. No acute infarct or intracranial hemorrhage. Prominent white matter changes consistent with patient's history of multiple sclerosis. No significant change from prior exams. No interval growth to suggest progressive multifocal leukoencephalopathy. No restricted motion or enhancement to suggest active demyelination. 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 Infundibulum remains deviated to the left. No other intracranial mass or abnormal enhancement. Global atrophy without hydrocephalus. Nonspecific 1 cm right parotid lesion unchanged. Electronically Signed   By: Genia Del M.D.   On: 07/11/2015 14:51   Dg Chest Port 1 View  07/12/2015  CLINICAL DATA:  Acute respiratory failure. EXAM: PORTABLE CHEST 1 VIEW COMPARISON:  07/11/2015. FINDINGS: Endotracheal tube and NG tube noted in good anatomic position. Mediastinum hilar structures normal. Low lung volumes with mild bibasilar subsegmental atelectasis and/or infiltrates slightly progressed from prior exam. Heart size normal. No pleural effusion or pneumothorax . IMPRESSION: 1. Lines and tubes in stable position. 2. Low lung volumes with mild bibasilar subsegmental atelectasis and/or infiltrates with slight progression from prior exam . Electronically Signed   By: [place]   On: 07/12/2015 07:58   Dg Chest Port 1 View  07/11/2015  CLINICAL DATA:  Endotracheal tube placement EXAM: PORTABLE CHEST 1 VIEW COMPARISON:  Portable exam 1610 hours compared to 07/10/2015 FINDINGS: Tip of endotracheal tube projects 6.8 cm above carina. Nasogastric tube extends into stomach. Normal heart size, mediastinal contours and pulmonary vascularity for technique and slight rotation. Minimal bibasilar atelectasis greater on  RIGHT. Lungs otherwise clear. No pleural effusion or pneumothorax. IMPRESSION: Satisfactory endotracheal tube position. Minimal bibasilar atelectasis greater on RIGHT. Electronically Signed   By: Lavonia Dana M.D.   On: 07/11/2015 16:18   Dg Fluoro Guide Lumbar Puncture  07/11/2015  CLINICAL DATA:  55 year old female with history of multiple sclerosis. Altered mental status. EXAM: DIAGNOSTIC LUMBAR PUNCTURE UNDER FLUOROSCOPIC GUIDANCE FLUOROSCOPY TIME:  Radiation Exposure Index (as provided by the fluoroscopic device): 37.34 micro Gray PROCEDURE: Witnessed informed consent was obtained from the patient's sister prior to the procedure (by phone as patient not able to give consent and patient is daughter is not available), including potential complications of headache, bleeding, allergy, and pain. With the patient prone and on a ventilator, the lower back was prepped with Betadine. 1% Lidocaine was used for local anesthesia. Lumbar puncture was performed at the L2-3 level with a single pass of a 22 gauge needle with return of clear CSF with an opening pressure of 11 cm water. Nine ml of CSF were obtained for laboratory studies. The patient tolerated the procedure well and there were no apparent complications. IMPRESSION: Successful fluoroscopic guided L2-3 lumbar puncture with collection of 9 cc of clear cerebral spinal fluid which was sent for labs as  per request. Electronically Signed   By: Genia Del M.D.   On: 07/11/2015 15:59     STUDIES:  1/8 CT head > no acute changes 1/9 MRI Brain >> chronic MS changes but nothing acute  CULTURES: 1/8 Blood X2 > 1 of 2 coag neg staph >>  CSF 1/9 >> normal Urine 1/10 >>   ANTIBIOTICS: Vanc 1/8 > 1/10 Ceftriaxone x1 1/8 Ceftaz 1/8 > 1/10 Acyclovir 1/8 > 1/10 Zosyn 1/10 >>    SIGNIFICANT EVENTS:   LINES/TUBES: 1/8 ETT >   DISCUSSION: 55 y.o female with multiple sclerosis who lives in a SNF and has recurrent UTI's was admitted from the [facility] ER with  acute encephalopathy of uncertain etiology.  Ddx includes polypharmacy, cipro effect, non-convulsive status epilepticus, no clear etiology right now.  Doesn't appear septic.  Neuro work up ongoing  ASSESSMENT / PLAN:  PULMONARY A: Acute respiratory failure with hypoxemia P:   Full vent support vap bundle No plans extubate until MS clears. Her weakness due to multiple sclerosis will complicate weaning and extubation.   CARDIOVASCULAR A:  HTN P:  Tele Increase coreg to 18.75 bid on 1/10 Add home spironolactone 1/10   RENAL A:   Hypernatremia AKI uncertain etiology, improving P:   Renal dose meds IVF Monitor BMET and UOP Replace electrolytes as needed   GASTROINTESTINAL A:   No acute issues P:   OG tube pepcid Tube feedings  HEMATOLOGIC A:   No acute issues P:  Monitor for bleeding dvt prophylaxis  INFECTIOUS A:   Recurrent UTI, pyuria on admission, recent acetobacter Coag neg staph 1 of 2, likely contaminant P:   D/c vanco + acyclovir on 1/10 D/c ceftaz 1/10 given potential for neuro effects Re-send urine cx now Add zosyn 1/10 for presumed residual acinetobacter UTI; does not have neuro effects that can be seen w cephalosporins.    ENDOCRINE A:   No acute issues   P:   Monitor glucose  NEUROLOGIC A:   Acute encephalopathy, LP, MRI and EEG non-diagnostic. Consider UTI / sepsis, effects of meds including cephalosporin encephalopathy  ? Seizures > EEG unclear P:   Antiepileptics per Neuro  Hold most home meds Changed abx to zosyn on 1/10 Keep baclofen > dose to half on 1/10 RASS goal: 0 Intermittent fentanyl    FAMILY  - Updates: Updated at bedside 1/9  - Inter-disciplinary family meet or Palliative Care meeting due by:  07/16/14  My cc time 35 minutes  Baltazar Apo, MD, PhD 07/12/2015, 8:03 AM [facility] [facility] [phone] or if no answer [phone]

## 2015-07-12 NOTE — Progress Notes (Signed)
[facility] ADULT ICU REPLACEMENT PROTOCOL FOR AM LAB REPLACEMENT ONLY  The patient does not apply for the [facility] Adult ICU Electrolyte Replacment Protocol based on the criteria listed below:     Is urine output >/= 0.5 ml/kg/hr for the last 6 hours? No. Patient's UOP is unknown  Abnormal electrolyte(s): K3.5   If a panic level lab has been reported, has the CCM MD in charge been notified? Yes.  .   Physician:  S Sommer,MD  [facility] Clock 07/12/2015 4:59 AM

## 2015-07-12 NOTE — Progress Notes (Signed)
Peripherally Inserted Central Catheter/Midline Placement  The IV Nurse has discussed with the patient and/or persons authorized to consent for the patient, the purpose of this procedure and the potential benefits and risks involved with this procedure.  The benefits include less needle sticks, lab draws from the catheter and patient may be discharged home with the catheter.  Risks include, but not limited to, infection, bleeding, blood clot (thrombus formation), and puncture of an artery; nerve damage and irregular heat beat.  Alternatives to this procedure were also discussed.  PICC/Midline Placement Documentation  PICC Double Lumen [id number] PICC Right Basilic 41 cm 2 cm (Active)  Indication for Insertion or Continuance of Line Poor Vasculature-patient has had multiple peripheral attempts or PIVs lasting less than 24 hours 07/11/2015  7:09 PM  Exposed Catheter (cm) 2 cm 07/11/2015  7:09 PM  Dressing Change Due 07/19/15 07/11/2015  7:09 PM       Jule Economy Horton 07/12/2015, 11:14 AM

## 2015-07-12 NOTE — Progress Notes (Signed)
In to see patient at this time, patient has had rising b/p's over last hours since medication modification this am per CCM team. Increased in Coreg and Aldactone added to morning medication with no effect on patients BP. Fentanyl given 34mcg IVP x1 per MD Mungal with no effect. Will continue to monitor the patient. BP prior to administration 160's. Status post administration bp rising still to 170's. Will continue to monitor the patient closely.

## 2015-07-12 NOTE — Progress Notes (Signed)
eLink Physician-Brief Progress Note Patient Name: Shannon Garrett DOB: 01-29-1961 MRN: [id number]   Date of Service  07/12/2015  HPI/Events of Note  Patient with Bp in the 160s, already had increase in the coreg and aldactone  eICU Interventions  Fentanyl 43mcg x 1, re-eval     Intervention Category Intermediate Interventions: Hypertension - evaluation and management  Vern Guerette 07/12/2015, 3:13 PM

## 2015-07-12 NOTE — Progress Notes (Signed)
EEG completed, results pending. 

## 2015-07-12 NOTE — Procedures (Signed)
ELECTROENCEPHALOGRAM REPORT  Patient: Shannon Garrett       Room #: [id number] EEG No. ID: [id number] Age: 55 y.o.        Sex: female Referring Physician: Lake Bells, D Report Date:  07/12/2015        Interpreting Physician: Anthony Sar  History: [name] is an 55 y.o. female with acute encephalopathy associated with urinary tract infection and sepsis. EEG on 07/11/2015 showed generalized slowing with triphasic waves which are not thought to be typical of epileptiform activity.  Indications for study:  Compared with previous study; rule out seizure activity.  Technique: This is an 18 channel routine scalp EEG performed at the bedside with bipolar and monopolar montages arranged in accordance to the international 10/20 system of electrode placement.   Description: Patient was intubated and on mechanical ventilation at the time of this study. Background activity consisted of low to moderate amplitude diffuse delta and theta activity with frequent triphasic discharges, as noted on previous study, which appeared to be of high amplitude and with sharp and contours at times. There were also occasional runs of approximately 5 Hz moderate to slightly high amplitude rhythmic discharges with slightly sharpened contours as well. Photic stimulation was not performed. No clear, no unequivocal epileptiform discharges were recorded.  Interpretation: This EEG recording was abnormal with moderately severe generalized slowing of cerebral activity with triphasic discharges as well as rhythmic theta activity as described above. Significance is unclear, as this activity is not unequivocal for epileptic phenomena. Clinical correlation is advised.   Rush Farmer M.D. [facility] [phone]

## 2015-07-12 NOTE — Progress Notes (Signed)
Subjective: No significant changes  Exam: Filed Vitals:   07/12/15 0736 07/12/15 0800  BP:  159/86  Pulse:  90  Temp: 98 F (36.7 C)   Resp:  22   Gen: In bed, NAD Resp: non-labored breathing, no acute distress Abd: soft, nt  Neuro: MS: Opens eyes to voice, does not follow commands.  [id number] pupil slightly larger than left. Both reactive. Blinks to threat from the right, not clearly from the left.  Motor: Increased tone throughout, she withdraws to noxious stimuli x 4, though less in LE.  Sensory:as above.   Pertinent Labs: UA 07/04/15- acinetobacter sensitive to mulitple drugs BC - 1/2 positive for coag-negative staph.   Impression: 55 yo F with acute encephalopathy several days after starting treatment for acinetobacter UTI. She does not have any evidence for CNS inflammation on LP. She has triphasic waves on EEG, which are not typically epileptic, but did have a fast frequency at times therefore prompting continued AED treatment.   She was treated with cefepime as inpatient, then switched to cipro at discharge. [place] both of these agents can cause encephalopathy it is typically in patients with more severe renal dysfunction, but given the lack of other causes I think that antibiotic associated encephalopathy may need to be considered. Given that her infection is sensitive to zosyn, I would favor this agent for now.   If her UTI has developed resistance, then gram negative infection(e.g. Uti) can certainly cause encephalopathy as well.   Recommendations: 1) Change abx to non-fluoroquinalone and non-cephalosporin agent.  2) decrease vimpat to 100mg  BID 3) continue dilantin 4) check cortisol 5) will continue to follow.   Roland Rack, MD [facility] [phone]  If 7pm- 7am, please page neurology on call as listed in [place].

## 2015-07-13 ENCOUNTER — Inpatient Hospital Stay (HOSPITAL_COMMUNITY): Payer: Medicare Other

## 2015-07-13 LAB — BASIC METABOLIC PANEL
ANION GAP: 8 (ref 5–15)
Anion gap: 9 (ref 5–15)
BUN: 24 mg/dL — AB (ref 6–20)
BUN: 24 mg/dL — ABNORMAL HIGH (ref 6–20)
CALCIUM: 8.2 mg/dL — AB (ref 8.9–10.3)
CALCIUM: 9 mg/dL (ref 8.9–10.3)
CO2: 22 mmol/L (ref 22–32)
CO2: 25 mmol/L (ref 22–32)
CREATININE: 0.88 mg/dL (ref 0.44–1.00)
Chloride: 118 mmol/L — ABNORMAL HIGH (ref 101–111)
Chloride: 121 mmol/L — ABNORMAL HIGH (ref 101–111)
Creatinine, Ser: 0.86 mg/dL (ref 0.44–1.00)
GFR calc Af Amer: >60 mL/min (ref >60)
GFR calc Af Amer: >60 mL/min (ref >60)
GFR calc non Af Amer: >60 mL/min (ref >60)
GLUCOSE: 156 mg/dL — AB (ref 65–99)
GLUCOSE: 154 mg/dL — AB (ref 65–99)
POTASSIUM: 2.6 mmol/L — AB (ref 3.5–5.1)
POTASSIUM: 3.9 mmol/L (ref 3.5–5.1)
Sodium: 149 mmol/L — ABNORMAL HIGH (ref 135–145)
Sodium: 154 mmol/L — ABNORMAL HIGH (ref 135–145)

## 2015-07-13 LAB — CBC
HCT: 31.4 % — ABNORMAL LOW (ref 36.0–46.0)
Hemoglobin: 10.8 g/dL — ABNORMAL LOW (ref 12.0–15.0)
MCH: 27.4 pg (ref 26.0–34.0)
MCHC: 34.4 g/dL (ref 30.0–36.0)
MCV: 79.7 fL (ref 78.0–100.0)
PLATELETS: 288 10*3/uL (ref 150–400)
RBC: 3.94 MIL/uL (ref 3.87–5.11)
RDW: 16 % — ABNORMAL HIGH (ref 11.5–15.5)
WBC: 8.1 10*3/uL (ref 4.0–10.5)

## 2015-07-13 LAB — URINE CULTURE: CULTURE: NO GROWTH

## 2015-07-13 LAB — CULTURE, BLOOD (ROUTINE X 2)

## 2015-07-13 LAB — GLUCOSE, CAPILLARY
GLUCOSE-CAPILLARY: 145 mg/dL — AB (ref 65–99)
GLUCOSE-CAPILLARY: 114 mg/dL — AB (ref 65–99)
Glucose-Capillary: 110 mg/dL — ABNORMAL HIGH (ref 65–99)
Glucose-Capillary: 121 mg/dL — ABNORMAL HIGH (ref 65–99)
Glucose-Capillary: 137 mg/dL — ABNORMAL HIGH (ref 65–99)
Glucose-Capillary: 157 mg/dL — ABNORMAL HIGH (ref 65–99)
Glucose-Capillary: 160 mg/dL — ABNORMAL HIGH (ref 65–99)

## 2015-07-13 LAB — MAGNESIUM: Magnesium: 1.9 mg/dL (ref 1.7–2.4)

## 2015-07-13 LAB — ACTH STIMULATION, 3 TIME POINTS
CORTISOL 30 MIN: 24.5 ug/dL
CORTISOL 60 MIN: 25.2 ug/dL
Cortisol, Base: 21.5 ug/dL

## 2015-07-13 LAB — PHOSPHORUS: Phosphorus: 2.6 mg/dL (ref 2.5–4.6)

## 2015-07-13 MED ORDER — FREE WATER
200.0000 mL | Freq: Four times a day (QID) | Status: DC
  Administered 2015-07-13 – 2015-07-14 (×5): 200 mL

## 2015-07-13 MED ORDER — HYDRALAZINE HCL 20 MG/ML IJ SOLN
10.0000 mg | INTRAMUSCULAR | Status: DC | PRN
  Administered 2015-07-13 – 2015-07-14 (×2): 10 mg via INTRAVENOUS
  Filled 2015-07-13 (×2): qty 1

## 2015-07-13 MED ORDER — HYDROCORTISONE NA SUCCINATE PF 100 MG IJ SOLR
50.0000 mg | Freq: Four times a day (QID) | INTRAMUSCULAR | Status: DC
  Administered 2015-07-13 – 2015-07-19 (×23): 50 mg via INTRAVENOUS
  Filled 2015-07-13 (×23): qty 2

## 2015-07-13 MED ORDER — POTASSIUM CHLORIDE 20 MEQ/15ML (10%) PO SOLN
40.0000 meq | ORAL | Status: AC
  Administered 2015-07-13 (×3): 40 meq
  Filled 2015-07-13 (×3): qty 30

## 2015-07-13 NOTE — Progress Notes (Addendum)
Subjective: Appears to be improved.   Exam: Filed Vitals:   07/13/15 0900 07/13/15 1000  BP: 152/70 165/72  Pulse: 89 99  Temp:    Resp: 28 18   Gen: In bed, NAD Resp: non-labored breathing, no acute distress Abd: soft, nt  Neuro: MS: awake, fixating and tracking. Doe snot clearly follow commands, but does squeeze left hand, not clear if to physical or verbal prompting.  CN:R pupil slightly larger than left, unchanged.  Motor: spastic quadripareses, does withdraw in all four, increased tone.  Sensory:as above  Pertinent Labs: Hypokalemia Improving creatinine.   Impression: 55 yo F with acute encephalopathy several days after starting treatment for acinetobacter UTI. She does not have any evidence for CNS inflammation on LP. She has triphasic waves on EEG, which are not typically epileptic, but did have a fast frequency at times therefore prompting continued AED treatment. Still not clear that seizure has truly played a role in her current presentation.   She was treated with cefepime as inpatient, then switched to cipro at discharge. [place] both of these agents can cause encephalopathy it is typically in patients with more severe renal dysfunction, but given the lack of other causes I think that antibiotic associated encephalopathy may need to be considered. Given that her infection is sensitive to zosyn, I would favor this agent for now.   If her UTI has developed resistance, then gram negative infection(e.g. Uti) can certainly cause encephalopathy as well.   She appears to be improving, so I would favor continuing present management for now. Th[place] not following commands, she does appear awake enough that if pulmonary-wise she is able to be extubated, I feel that this may be appropriate.   Also, cortisol is low on random check.  Recommendations: 1) Continue vimpat/dilantin 2) acth stim test.  3) will continue to follow.   Roland Rack, MD [facility] [phone]  If 7pm- 7am, please page neurology on call as listed in [place].  Given low am cortisol, after discussion with Dr. Lamonte Sakai, will start stress dose steroids.   Hydrocortisone 50mg  IV Q6H  Roland Rack, MD [facility] [phone]  If 7pm- 7am, please page neurology on call as listed in [place].

## 2015-07-13 NOTE — Progress Notes (Signed)
Spoke with Joycelyn Schmid in Infection Prevention. Patient does NOT need to be on contact isolation for acinetobacter in her urine as it is sensitive to antibiotics.

## 2015-07-13 NOTE — Progress Notes (Signed)
[facility] Progress Note Patient Name: Shannon Garrett DOB: [birth year] MRN: [id number]   Date of Service  07/13/2015  HPI/Events of Note  Hypertension, SBP 163  eICU Interventions  Hydralazine PRN ordered     Intervention Category Intermediate Interventions: Hypertension - evaluation and management  Kitrina Maurin 07/13/2015, 3:12 AM

## 2015-07-13 NOTE — Progress Notes (Addendum)
[facility] ADULT ICU REPLACEMENT PROTOCOL FOR AM LAB REPLACEMENT ONLY  The patient does apply for the [facility] Adult ICU Electrolyte Replacment Protocol based on the criteria listed below:   1. Is GFR >/= 40 ml/min? Yes.    Patient's GFR today is >60 2. Is urine output >/= 0.5 ml/kg/hr for the last 6 hours? Yes.   Patient's UOP is 0.7 ml/kg/hr 3. Is BUN < 60 mg/dL? Yes.    Patient's BUN today is 24 4. Abnormal electrolyte(s):  K - 2.6 5. Ordered repletion PER PROTOCOL 6. If a panic level lab has been reported, has the CCM MD in charge been notified? Yes.  .   Physician:  Dr. Erasmo Score 07/13/2015 5:02 AM

## 2015-07-13 NOTE — Progress Notes (Signed)
PULMONARY / CRITICAL CARE MEDICINE   Name: Shannon Garrett MRN: [id number] DOB: 10/16/1960    ADMISSION DATE:  07/10/2015 CONSULTATION DATE:  07/10/2015  REFERRING MD:  Stark Jock  CHIEF COMPLAINT:  Acute encephalopathy  HISTORY OF PRESENT ILLNESS:   55 y/o female with a history of MS and multiple admissions for foley catheter associated UTI was admitted from [facility] hospital on 07/09/2014 after being brought in by EMS from her SNF.  She was just discharged from [facility] on 1/6 after being treated for a UTI from acinetobacter.  She was discharged on Cipro.  Today she was found nearly unresponsive in her SNF.  No family at bedside, no one available to provide history.     SUBJECTIVE:  Tracking consistently RN reports she may have followed commands overnight, is not currently   VITAL SIGNS: BP 165/72 mmHg  Pulse 99  Temp(Src) 98.5 F (36.9 C) (Axillary)  Resp 18  Ht 5\' 2"  (1.575 m)  Wt 92.5 kg (203 lb 14.8 oz)  BMI 37.29 kg/m2  SpO2 100%  HEMODYNAMICS:    VENTILATOR SETTINGS: Vent Mode:  [-] PSV;CPAP FiO2 (%):  [40 %] 40 % Set Rate:  [16 bmp] 16 bmp Vt Set:  [450 mL] 450 mL PEEP:  [5 cmH20] 5 cmH20 Pressure Support:  [20 cmH20] 20 cmH20 Plateau Pressure:  [15 cmH20-22 cmH20] 22 cmH20  INTAKE / OUTPUT: I/O last 3 completed shifts: In: [id number] [I.V.:3500; NG/GT:1280; IV Piggyback:630] Out: 2525 [Urine:2525]  PHYSICAL EXAMINATION: General:  Obese, on vent Neuro:  Eyes open, tracks and turns to voice, able to squeeze hand but unclear if this is to command.  HEENT:  NCAT, ETTin place, Pupils sluggish Cardiovascular:  RRR, no mgr Lungs:  CTA B Abdomen:  BS+, soft, non tender  Musculoskeletal:  Diminished tone but sedated Skin:  Edema all over  LABS:  BMET  Recent Labs Lab 07/11/15 0240 07/12/15 0320 07/13/15 0330  NA 148* 148* 149*  K 3.2* 3.5 2.6*  CL 112* 113* 118*  CO2 23 20* 22  BUN 28* 23* 24*  CREATININE 1.40* 1.15* 0.88  GLUCOSE 166* 107* 156*     Electrolytes  Recent Labs Lab 07/11/15 0240 07/12/15 0320 07/13/15 0330  CALCIUM 9.1 9.3 8.2*  MG 1.9  --  1.9  PHOS 2.9  --  2.6    CBC  Recent Labs Lab 07/11/15 0240 07/12/15 0320 07/13/15 0330  WBC 9.9 10.0 8.1  HGB 11.2* 11.9* 10.8*  HCT 34.2* 36.1 31.4*  PLT 263 273 288    Coag's No results for input(s): APTT, INR in the last 168 hours.  Sepsis Markers  Recent Labs Lab 07/10/15 1507 07/10/15 2020  LATICACIDVEN 0.7 1.1  PROCALCITON  --  <0.10    ABG  Recent Labs Lab 07/10/15 1524 07/10/15 1801  PHART 7.336* 7.420  PCO2ART 56.7* 41.2  PO2ART 412.0* 417.0*    Liver Enzymes  Recent Labs Lab 07/10/15 1507  AST 17  ALT 31  ALKPHOS 166*  BILITOT 0.6  ALBUMIN 3.6    Cardiac Enzymes No results for input(s): TROPONINI, PROBNP in the last 168 hours.  Glucose  Recent Labs Lab 07/12/15 1322 07/12/15 1516 07/12/15 1918 07/12/15 2343 07/13/15 0340 07/13/15 0759  GLUCAP 139* 116* 137* 157* 160* 110*    Imaging Dg Chest Port 1 View  07/13/2015  CLINICAL DATA:  Ventilator dependent respiratory failure, acute encephalopathy. EXAM: PORTABLE CHEST 1 VIEW COMPARISON:  Portable chest x-ray of July 12, 2015, July 04, 2015, and  PA and lateral chest of February 20, 2014. FINDINGS: The lungs are adequately inflated. There is no focal infiltrate. There is stable nodule projecting over the posterior aspect of the right eighth rib. The heart is top-normal in size. The pulmonary vascularity is normal. There is no pleural effusion or pneumothorax. The endotracheal tube tip lies 4.9 cm above the carina. The esophagogastric tube tip projects below the inferior margin of the image. The right-sided PICC line tip projects over the midportion of the SVC. IMPRESSION: There is no evidence of pneumonia. Minimal atelectasis suspected at both lung bases less conspicuous today. The support tubes are in reasonable position. Persistent nodularity projecting over the  posterior aspect of the right eighth rib. Electronically Signed   By: David  Martinique M.D.   On: 07/13/2015 08:14     STUDIES:  1/8 CT head > no acute changes 1/9 MRI Brain >> chronic MS changes but nothing acute  CULTURES: 1/8 Blood X2 > 1 of 2 coag neg staph >>  CSF 1/9 >> normal Urine 1/10 >>   ANTIBIOTICS: Vanc 1/8 > 1/10 Ceftriaxone x1 1/8 Ceftaz 1/8 > 1/10 Acyclovir 1/8 > 1/10 Zosyn 1/10 >>    SIGNIFICANT EVENTS:   LINES/TUBES: 1/8 ETT >   DISCUSSION: 55 y.o female with multiple sclerosis who lives in a SNF and has recurrent UTI's was admitted from the [facility] ER with acute encephalopathy of uncertain etiology.  Ddx includes polypharmacy, cipro effect, non-convulsive status epilepticus, no clear etiology right now.  Doesn't appear septic.  Neuro work up ongoing  ASSESSMENT / PLAN:  PULMONARY A: Acute respiratory failure with hypoxemia P:   Full vent support vap bundle No plans extubate until MS clears. Her weakness due to multiple sclerosis will complicate weaning and extubation. Will likely need to broach subject of tracheostomy  CARDIOVASCULAR A:  HTN P:  Tele Increased coreg to 18.75 bid on 1/10 Added home spironolactone 1/10   RENAL A:   Hypernatremia AKI uncertain etiology, improving P:   Renal dose meds IVF Monitor BMET and UOP Replace electrolytes as needed   GASTROINTESTINAL A:   No acute issues P:   OG tube pepcid Tube feedings  HEMATOLOGIC A:   No acute issues P:  Monitor for bleeding dvt prophylaxis  INFECTIOUS A:   Recurrent UTI, pyuria on admission, recent acetobacter Coag neg staph 1 of 2, likely contaminant P:   D/c vanco + acyclovir on 1/10 D/c ceftaz 1/10 given potential for neuro effects Re-send urine cx now Started zosyn 1/10 for presumed residual acinetobacter UTI; does not have the neuro effects that can be seen w cephalosporins.    ENDOCRINE A:   No acute issues   P:   Monitor glucose  NEUROLOGIC A:    Acute encephalopathy, LP, MRI and EEG non-diagnostic. Consider UTI / sepsis, effects of meds including cephalosporin encephalopathy. Appears to be slowly improving 1/11.  ? Seizures > EEG unclear P:   Antiepileptics per Neuro  Hold most home meds Changed abx to zosyn on 1/10 Keep baclofen > dose to half on 1/10 RASS goal: 0 Intermittent fentanyl    FAMILY  - Updates: Updated at bedside 1/9  - Inter-disciplinary family meet or Palliative Care meeting due by:  07/16/14  My cc time 83 minutes  Baltazar Apo, MD, PhD 07/13/2015, 10:42 AM [facility] [phone] or if no answer [phone]

## 2015-07-13 NOTE — Progress Notes (Signed)
CRITICAL VALUE ALERT  Critical value received:  K 2.6   Date of notification:  07/13/2015  Time of notification:  0452  Critical value read back:Yes.    Nurse who received alert:  Eleonore Chiquito RN   MD notified (1st page):  E-Link  Time of first page:  [phone]  MD notified (2nd page):  Time of second page:  Responding MD:  Suezanne Jacquet RN- Warren Lacy  Time MD responded:  [phone]

## 2015-07-14 ENCOUNTER — Inpatient Hospital Stay (HOSPITAL_COMMUNITY): Payer: Medicare Other

## 2015-07-14 LAB — GLUCOSE, CAPILLARY
GLUCOSE-CAPILLARY: 128 mg/dL — AB (ref 65–99)
GLUCOSE-CAPILLARY: 129 mg/dL — AB (ref 65–99)
GLUCOSE-CAPILLARY: 138 mg/dL — AB (ref 65–99)
Glucose-Capillary: 137 mg/dL — ABNORMAL HIGH (ref 65–99)
Glucose-Capillary: 152 mg/dL — ABNORMAL HIGH (ref 65–99)
Glucose-Capillary: 148 mg/dL — ABNORMAL HIGH (ref 65–99)

## 2015-07-14 LAB — CBC
HCT: 32.6 % — ABNORMAL LOW (ref 36.0–46.0)
Hemoglobin: 10.7 g/dL — ABNORMAL LOW (ref 12.0–15.0)
MCH: 26.2 pg (ref 26.0–34.0)
MCHC: 32.8 g/dL (ref 30.0–36.0)
MCV: 79.9 fL (ref 78.0–100.0)
PLATELETS: 270 10*3/uL (ref 150–400)
RBC: 4.08 MIL/uL (ref 3.87–5.11)
RDW: 16.4 % — AB (ref 11.5–15.5)
WBC: 9 10*3/uL (ref 4.0–10.5)

## 2015-07-14 LAB — BASIC METABOLIC PANEL
Anion gap: 7 (ref 5–15)
BUN: 26 mg/dL — AB (ref 6–20)
CHLORIDE: 123 mmol/L — AB (ref 101–111)
CO2: 25 mmol/L (ref 22–32)
CREATININE: 0.85 mg/dL (ref 0.44–1.00)
Calcium: 9 mg/dL (ref 8.9–10.3)
GFR calc Af Amer: >60 mL/min (ref >60)
GFR calc non Af Amer: >60 mL/min (ref >60)
Glucose, Bld: 156 mg/dL — ABNORMAL HIGH (ref 65–99)
Potassium: 3.5 mmol/L (ref 3.5–5.1)
SODIUM: 155 mmol/L — AB (ref 135–145)

## 2015-07-14 MED ORDER — POTASSIUM CHLORIDE 20 MEQ/15ML (10%) PO SOLN
20.0000 meq | ORAL | Status: AC
Start: 2015-07-14 — End: 2015-07-14
  Administered 2015-07-14 (×2): 20 meq
  Filled 2015-07-14 (×2): qty 15

## 2015-07-14 MED ORDER — FREE WATER
400.0000 mL | Freq: Four times a day (QID) | Status: DC
  Administered 2015-07-14 – 2015-07-15 (×3): 400 mL

## 2015-07-14 NOTE — Progress Notes (Signed)
PULMONARY / CRITICAL CARE MEDICINE   Name: Shannon Garrett MRN: [id number] DOB: [birth year]    ADMISSION DATE:  07/10/2015 CONSULTATION DATE:  07/10/2015  REFERRING MD:  Stark Jock  CHIEF COMPLAINT:  Acute encephalopathy  HISTORY OF PRESENT ILLNESS:   55 y/o female with a history of MS and multiple admissions for foley catheter associated UTI was admitted from [facility] hospital on 07/09/2014 after being brought in by EMS from her SNF.  She was just discharged from [facility] on 1/6 after being treated for a UTI from acinetobacter.  She was discharged on Cipro.  Today she was found nearly unresponsive in her SNF.  No family at bedside, no one available to provide history.     SUBJECTIVE:  Tracking consistently She squeezes hands hydrocort started for suspected adrenal insuff on 1/11   VITAL SIGNS: BP 151/70 mmHg  Pulse 111  Temp(Src) 99.9 F (37.7 C) (Axillary)  Resp 15  Ht 5\' 2"  (1.575 m)  Wt 92.6 kg (204 lb 2.3 oz)  BMI 37.33 kg/m2  SpO2 100%  HEMODYNAMICS:    VENTILATOR SETTINGS: Vent Mode:  [-] PSV;CPAP FiO2 (%):  [40 %] 40 % Set Rate:  [16 bmp] 16 bmp Vt Set:  [450 mL] 450 mL PEEP:  [5 cmH20] 5 cmH20 Pressure Support:  [12 cmH20-18 cmH20] 18 cmH20 Plateau Pressure:  [14 cmH20-25 cmH20] 14 cmH20  INTAKE / OUTPUT: I/O last 3 completed shifts: In: [id number] [I.V.:3450; NG/GT:2470; IV Piggyback:335] Out: [id number] [Urine:3720]  PHYSICAL EXAMINATION: General:  Obese, on vent Neuro:  Eyes open, tracks and turns to voice, able to squeeze hand but unclear if this is to command.  HEENT:  NCAT, ETTin place, Pupils sluggish Cardiovascular:  RRR, no mgr Lungs:  CTA B Abdomen:  BS+, soft, non tender  Musculoskeletal:  Diminished tone but sedated Skin:  Edema all over  LABS:  BMET  Recent Labs Lab 07/13/15 0330 07/13/15 2005 07/14/15 0415  NA 149* 154* 155*  K 2.6* 3.9 3.5  CL 118* 121* 123*  CO2 22 25 25   BUN 24* 24* 26*  CREATININE 0.88 0.86 0.85  GLUCOSE 156* 154* 156*     Electrolytes  Recent Labs Lab 07/11/15 0240  07/13/15 0330 07/13/15 2005 07/14/15 0415  CALCIUM 9.1  < > 8.2* 9.0 9.0  MG 1.9  --  1.9  --   --   PHOS 2.9  --  2.6  --   --   < > = values in this interval not displayed.  CBC  Recent Labs Lab 07/12/15 0320 07/13/15 0330 07/14/15 0415  WBC 10.0 8.1 9.0  HGB 11.9* 10.8* 10.7*  HCT 36.1 31.4* 32.6*  PLT 273 288 270    Coag's No results for input(s): APTT, INR in the last 168 hours.  Sepsis Markers  Recent Labs Lab 07/10/15 1507 07/10/15 2020  LATICACIDVEN 0.7 1.1  PROCALCITON  --  <0.10    ABG  Recent Labs Lab 07/10/15 1524 07/10/15 1801  PHART 7.336* 7.420  PCO2ART 56.7* 41.2  PO2ART 412.0* 417.0*    Liver Enzymes  Recent Labs Lab 07/10/15 1507  AST 17  ALT 31  ALKPHOS 166*  BILITOT 0.6  ALBUMIN 3.6    Cardiac Enzymes No results for input(s): TROPONINI, PROBNP in the last 168 hours.  Glucose  Recent Labs Lab 07/13/15 0759 07/13/15 1220 07/13/15 1623 07/13/15 1922 07/13/15 2337 07/14/15 0342  GLUCAP 110* 145* 121* 114* 138* 137*    Imaging No results found.   STUDIES:  1/8 CT head > no acute changes 1/9 MRI Brain >> chronic MS changes but nothing acute  CULTURES: 1/8 Blood X2 > 1 of 2 coag neg staph >>  CSF 1/9 >> normal Urine 1/10 >>   ANTIBIOTICS: Vanc 1/8 > 1/10 Ceftriaxone x1 1/8 Ceftaz 1/8 > 1/10 Acyclovir 1/8 > 1/10 Zosyn 1/10 >>    SIGNIFICANT EVENTS:   LINES/TUBES: 1/8 ETT >   DISCUSSION: 55 y.o female with multiple sclerosis who lives in a SNF and has recurrent UTI's was admitted from the [facility] ER with acute encephalopathy of uncertain etiology.  Ddx includes polypharmacy, cipro effect, non-convulsive status epilepticus, no clear etiology right now.  Doesn't appear septic.  Neuro work up ongoing  ASSESSMENT / PLAN:  PULMONARY A: Acute respiratory failure with hypoxemia P:   Full vent support vap bundle No plans extubate until MS clears. Her  weakness due to multiple sclerosis will complicate weaning and extubation. Will likely need to broach subject of tracheostomy if no improvement in next few days  CARDIOVASCULAR A:  HTN P:  Tele Increased coreg to 18.75 bid on 1/10 Added home spironolactone 1/10   RENAL A:   Hypernatremia AKI uncertain etiology, improving P:   Renal dose meds IVF Monitor BMET and UOP Replace electrolytes as needed Free water   GASTROINTESTINAL A:   No acute issues P:   OG tube pepcid Tube feedings  HEMATOLOGIC A:   No acute issues P:  Monitor for bleeding dvt prophylaxis  INFECTIOUS A:   Recurrent UTI, pyuria on admission, recent acetobacter Coag neg staph 1 of 2, likely contaminant P:   D/c vanco + acyclovir on 1/10 D/c ceftaz 1/10 given potential for neuro effects Re-send urine cx now Started zosyn 1/10 for presumed residual acinetobacter UTI; does not have the neuro effects that can be seen w cephalosporins.    ENDOCRINE A:   No acute issues   P:   Monitor glucose  NEUROLOGIC A:   Acute encephalopathy, LP, MRI and EEG non-diagnostic. Consider UTI / sepsis, effects of meds including cephalosporin encephalopathy. Appears to be slowly improving 1/11. Also considering adrenal insufficiency following recent infxn. ? Seizures > EEG unclear P:   Antiepileptics per Neuro, probably start to wean them down if EEG improving, discussed with Dr Leonel Ramsay Hold most home meds Changed abx to zosyn on 1/10 Empiric hydrocort 60 q6h added 1/11 Keep baclofen > dose to half on 1/10 RASS goal: 0 Intermittent fentanyl    FAMILY  - Updates: Updated at bedside 1/9  - Inter-disciplinary family meet or Palliative Care meeting due by:  07/16/14  My cc time 26 minutes  Baltazar Apo, MD, PhD 07/14/2015, 12:00 PM [place] [facility] [phone] or if no answer [phone]

## 2015-07-14 NOTE — Progress Notes (Signed)
Subjective: Continues to appear more awake  Exam: Filed Vitals:   07/14/15 0700 07/14/15 0735  BP: 159/67 159/67  Pulse: 110 110  Temp:    Resp: 14 30   Gen: In bed, NAD Resp: non-labored breathing, no acute distress Abd: soft, nt  Neuro: MS: awake, fixating and tracking. Squeezes hands when presented to either side, but does nto reliably follow other commands.  CN:R pupil slightly larger than left, unchanged.  Motor: spastic quadripareses Sensory:as above  Pertinent Labs: Hypokalemia Improving creatinine.   Impression: 55 yo F with acute encephalopathy several days after starting treatment for acinetobacter UTI. She does not have any evidence for CNS inflammation on LP. She has triphasic waves on EEG, which are not typically epileptic, but did have a fast frequency at times therefore prompting continued AED treatment. Still not clear that seizure has truly played a role in her current presentation. Clinically, she does not appear to be seizing.   Both cipro and cefepime can be associated with encephalopathy as well. She has been changed to zosyn.   If her UTI has developed resistance, then gram negative infection(e.g. Uti) can certainly cause encephalopathy as well.   Cortisol is low on random check. Stim test negative, I wonder about secondary or tertiary insufficiency. This could explain her   Recommendations: 1) Continue vimpat/dilantin for now.  2) Continue steroids 3) repeat EEG 4) Will continue to follow.   Roland Rack, MD [facility] [phone]  If 7pm- 7am, please page neurology on call as listed in [place].

## 2015-07-14 NOTE — Progress Notes (Signed)
[facility] ADULT ICU REPLACEMENT PROTOCOL FOR AM LAB REPLACEMENT ONLY  The patient does apply for the [facility] Adult ICU Electrolyte Replacment Protocol based on the criteria listed below:   1. Is GFR >/= 40 ml/min? Yes.    Patient's GFR today is >60 2. Is urine output >/= 0.5 ml/kg/hr for the last 6 hours? Yes.   Patient's UOP is 2.08 ml/kg/hr 3. Is BUN < 60 mg/dL? Yes.    Patient's BUN today is 26 4. Abnormal electrolyte(s): k3.5 5. Ordered repletion with: per protocol 6. If a panic level lab has been reported, has the CCM MD in charge been notified? Yes.  .   Physician:  Paris Lore 07/14/2015 5:56 AM

## 2015-07-14 NOTE — Procedures (Signed)
History: 55 yo F with altered mental status  Sedation: None  Technique: This is a 21 channel routine scalp EEG performed at the bedside with bipolar and monopolar montages arranged in accordance to the international 10/20 system of electrode placement. One channel was dedicated to EKG recording.    Background: There is a posterior dominant rhythm of 9 Hz that is seen. In addition, there is mild irregular generalized delta activity. There is occasional bifrontally predominant triphasic discharges without periodicity. No sleep is observed.  Photic stimulation: Physiologic driving is now performed  EEG Abnormalities: 1) rare triphasic waves 2) mild slow activity, generalized irregular  Clinical Interpretation: This EEG is consistent with a mild generalized non-specific cerebral dysfunction(encephalopathy).   Compared to her previous recordings, this EEG represents marked improvement.  Roland Rack, MD [facility] [phone]  If 7pm- 7am, please page neurology on call as listed in [place].

## 2015-07-14 NOTE — Progress Notes (Signed)
Bedside EEG completed, results pending. 

## 2015-07-15 LAB — BASIC METABOLIC PANEL
Anion gap: 8 (ref 5–15)
Anion gap: 10 (ref 5–15)
BUN: 27 mg/dL — AB (ref 6–20)
BUN: 24 mg/dL — AB (ref 6–20)
CHLORIDE: 125 mmol/L — AB (ref 101–111)
CHLORIDE: 123 mmol/L — AB (ref 101–111)
CO2: 25 mmol/L (ref 22–32)
CO2: 25 mmol/L (ref 22–32)
CREATININE: 0.89 mg/dL (ref 0.44–1.00)
CREATININE: 0.8 mg/dL (ref 0.44–1.00)
Calcium: 8.9 mg/dL (ref 8.9–10.3)
Calcium: 9.1 mg/dL (ref 8.9–10.3)
GFR calc Af Amer: >60 mL/min (ref >60)
GFR calc non Af Amer: >60 mL/min (ref >60)
GLUCOSE: 164 mg/dL — AB (ref 65–99)
Glucose, Bld: 145 mg/dL — ABNORMAL HIGH (ref 65–99)
POTASSIUM: 3.2 mmol/L — AB (ref 3.5–5.1)
Potassium: 3.6 mmol/L (ref 3.5–5.1)
SODIUM: 158 mmol/L — AB (ref 135–145)
SODIUM: 158 mmol/L — AB (ref 135–145)

## 2015-07-15 LAB — GLUCOSE, CAPILLARY
GLUCOSE-CAPILLARY: 138 mg/dL — AB (ref 65–99)
Glucose-Capillary: 126 mg/dL — ABNORMAL HIGH (ref 65–99)
Glucose-Capillary: 133 mg/dL — ABNORMAL HIGH (ref 65–99)
Glucose-Capillary: 139 mg/dL — ABNORMAL HIGH (ref 65–99)
Glucose-Capillary: 142 mg/dL — ABNORMAL HIGH (ref 65–99)
Glucose-Capillary: 147 mg/dL — ABNORMAL HIGH (ref 65–99)

## 2015-07-15 LAB — CBC
HEMATOCRIT: 30.2 % — AB (ref 36.0–46.0)
Hemoglobin: 9.9 g/dL — ABNORMAL LOW (ref 12.0–15.0)
MCH: 26.6 pg (ref 26.0–34.0)
MCHC: 32.8 g/dL (ref 30.0–36.0)
MCV: 81.2 fL (ref 78.0–100.0)
Platelets: 232 10*3/uL (ref 150–400)
RBC: 3.72 MIL/uL — AB (ref 3.87–5.11)
RDW: 16.7 % — AB (ref 11.5–15.5)
WBC: 9.2 10*3/uL (ref 4.0–10.5)

## 2015-07-15 LAB — CULTURE, BLOOD (ROUTINE X 2): Culture: NO GROWTH

## 2015-07-15 MED ORDER — POTASSIUM CHLORIDE 20 MEQ/15ML (10%) PO SOLN
30.0000 meq | ORAL | Status: AC
Start: 2015-07-15 — End: 2015-07-15
  Administered 2015-07-15 (×2): 30 meq
  Filled 2015-07-15 (×2): qty 30

## 2015-07-15 MED ORDER — FREE WATER
400.0000 mL | Status: DC
  Administered 2015-07-15 – 2015-07-17 (×11): 400 mL

## 2015-07-15 MED ORDER — DEXTROSE 5 % IV SOLN
INTRAVENOUS | Status: DC
  Administered 2015-07-15 – 2015-07-20 (×7): via INTRAVENOUS

## 2015-07-15 NOTE — Progress Notes (Signed)
Subjective: Continues to improve, now following commands.   Exam: Filed Vitals:   07/15/15 0700 07/15/15 0725  BP: 156/75 156/75  Pulse: 93 100  Temp:    Resp: 18 14   Gen: In bed, NAD Resp: non-labored breathing, no acute distress Abd: soft, nt  Neuro: MS: awake, fixating and tracking. Shows thumbs and closes eyes to command.  CN:R pupil slightly larger than left, unchanged.  Motor: spastic quadripareses Sensory:as above  Pertinent Labs: Hypokalemia Improving creatinine.   Impression: 55 yo F with acute encephalopathy several days after starting treatment for acinetobacter UTI.   Both cipro and cefepime can be associated with encephalopathy as well. She has been changed to zosyn.   At this point, my suspicion is for secondary or tertiary cortisol deficiency in the setting of infection as a cause for her encephalopathy. It is difficult to rule out antibiotic related encephalopathy, however.   Though her EEGs were concerning, there has not been any definite evidence for seizure, and I will begin titrating down her AEDs. Her repeat EEG was much improved. If she were to relapse, then this may need to be revisited.   Recommendations: 1) Continue dilantin for now. Will stop vimpat.  2) stress dose steroids.  3) Will continue to follow.   Roland Rack, MD [facility] [phone]  If 7pm- 7am, please page neurology on call as listed in [place].

## 2015-07-15 NOTE — Progress Notes (Signed)
Pharmacy Antibiotic Follow-up Note  Shannon Garrett is a 55 y.o. year-old female admitted on 07/10/2015.  The patient is currently on day 5 of zosyn  for recurrent (? Acinetobacter)UTI (avoiding cephalosporins and quinolones due to potential of antibiotic associated encephalopathy).  Blood culture noted w/ CONS 1/2- likely contaminant. -WBC= 9.2, afeb, SCr= 0.89, CrCl ~80   Plan: -Zosyn 3.357gm IV q8h -Will sign off. Please call pharmacy with any other needs  Thank you, Hildred Laser, Pharm D 07/15/2015 12:52 PM    Temp (24hrs), Avg:99.4 F (37.4 C), Min:98.6 F (37 C), Max:100.3 F (37.9 C)   Recent Labs Lab 07/11/15 0240 07/12/15 0320 07/13/15 0330 07/14/15 0415 07/15/15 0515  WBC 9.9 10.0 8.1 9.0 9.2     Recent Labs Lab 07/12/15 0320 07/13/15 0330 07/13/15 2005 07/14/15 0415 07/15/15 0515  CREATININE 1.15* 0.88 0.86 0.85 0.89   Estimated Creatinine Clearance: 79 mL/min (by C-G formula based on Cr of 0.89).    Allergies  Allergen Reactions  . Sulfonamide Derivatives Hives and Shortness Of Breath  . Penicillins Hives and Itching    Has patient had a PCN reaction causing immediate rash, facial/tongue/throat swelling, SOB or lightheadedness with hypotension: unknown Has patient had a PCN reaction causing severe rash involving mucus membranes or skin necrosis: unknown Has patient had a PCN reaction that required hospitalization: unknown Has patient had a PCN reaction occurring within the last 10 years: unknown If all of the above answers are "NO", then may proceed with Cephalosporin use. Prior course of rocephin charted 05/2015    Antimicrobials this admission: Acyclovir 1/8>> 1/10 Rocephin 1/8 x 1 Vancomycin 1/8>> 1/10  Tressie Ellis 1/8>> 1/10 Zosyn 1/10>>  Levels/dose changes this admission: n/a  Microbiology results: 1/8 BCx: 1/2 CONS 1/9 LP - ngtd 1/10 urine- neg 1/2 urine acinetobacter (intermed to rocephin; sens to zosyn)  Thank you for allowing pharmacy  to be a part of this patient's care.  Dareen Piano PharmD 07/15/2015 12:49 PM

## 2015-07-15 NOTE — Progress Notes (Signed)
PULMONARY / CRITICAL CARE MEDICINE   Name: Shannon Garrett MRN: [id number] DOB: 1961/04/09    ADMISSION DATE:  07/10/2015 CONSULTATION DATE:  07/10/2015  REFERRING MD:  Stark Jock  CHIEF COMPLAINT:  Acute encephalopathy  HISTORY OF PRESENT ILLNESS:   55 y/o female with a history of MS and multiple admissions for foley catheter associated UTI was admitted from [facility] hospital on 07/09/2014 after being brought in by EMS from her SNF.  She was just discharged from [facility] on 1/6 after being treated for a UTI from acinetobacter.  She was discharged on Cipro.  Today she was found nearly unresponsive in her SNF.  No family at bedside, no one available to provide history.     SUBJECTIVE:  Consistently following commands 1/13 am > improved   VITAL SIGNS: BP 153/78 mmHg  Pulse 96  Temp(Src) 98.6 F (37 C) (Oral)  Resp 21  Ht 5\' 2"  (1.575 m)  Wt 94.1 kg (207 lb 7.3 oz)  BMI 37.93 kg/m2  SpO2 100%  HEMODYNAMICS:    VENTILATOR SETTINGS: Vent Mode:  [-] PSV;CPAP FiO2 (%):  [40 %] 40 % Set Rate:  [16 bmp] 16 bmp Vt Set:  [450 mL] 450 mL PEEP:  [5 cmH20] 5 cmH20 Pressure Support:  [10 cmH20-18 cmH20] 10 cmH20 Plateau Pressure:  [16 cmH20-21 cmH20] 19 cmH20  INTAKE / OUTPUT: I/O last 3 completed shifts: In: [id number] [I.V.:3425; NG/GT:2810; IV Piggyback:505] Out: 4810 [Urine:4810]  PHYSICAL EXAMINATION: General:  Obese, on vent Neuro:  Eyes open, tracks and turns to voice, follows commands, weak cough HEENT:  NCAT, ETTin place, Pupils sluggish Cardiovascular:  RRR, no mgr Lungs:  CTA B Abdomen:  BS+, soft, non tender  Musculoskeletal:  Diminished tone but sedated Skin:  Edema all over  LABS:  BMET  Recent Labs Lab 07/13/15 2005 07/14/15 0415 07/15/15 0515  NA 154* 155* 158*  K 3.9 3.5 3.2*  CL 121* 123* 125*  CO2 25 25 25   BUN 24* 26* 27*  CREATININE 0.86 0.85 0.89  GLUCOSE 154* 156* 145*    Electrolytes  Recent Labs Lab 07/11/15 0240  07/13/15 0330  07/13/15 2005 07/14/15 0415 07/15/15 0515  CALCIUM 9.1  < > 8.2* 9.0 9.0 8.9  MG 1.9  --  1.9  --   --   --   PHOS 2.9  --  2.6  --   --   --   < > = values in this interval not displayed.  CBC  Recent Labs Lab 07/13/15 0330 07/14/15 0415 07/15/15 0515  WBC 8.1 9.0 9.2  HGB 10.8* 10.7* 9.9*  HCT 31.4* 32.6* 30.2*  PLT 288 270 232    Coag's No results for input(s): APTT, INR in the last 168 hours.  Sepsis Markers  Recent Labs Lab 07/10/15 1507 07/10/15 2020  LATICACIDVEN 0.7 1.1  PROCALCITON  --  <0.10    ABG  Recent Labs Lab 07/10/15 1524 07/10/15 1801  PHART 7.336* 7.420  PCO2ART 56.7* 41.2  PO2ART 412.0* 417.0*    Liver Enzymes  Recent Labs Lab 07/10/15 1507  AST 17  ALT 31  ALKPHOS 166*  BILITOT 0.6  ALBUMIN 3.6    Cardiac Enzymes No results for input(s): TROPONINI, PROBNP in the last 168 hours.  Glucose  Recent Labs Lab 07/14/15 1147 07/14/15 1544 07/14/15 1915 07/14/15 2356 07/15/15 0341 07/15/15 0814  GLUCAP 152* 129* 148* 139* 133* 126*    Imaging No results found.   STUDIES:  1/8 CT head > no  acute changes 1/9 MRI Brain >> chronic MS changes but nothing acute  CULTURES: 1/8 Blood X2 > 1 of 2 coag neg staph >>  CSF 1/9 >> normal Urine 1/10 >>   ANTIBIOTICS: Vanc 1/8 > 1/10 Ceftriaxone x1 1/8 Ceftaz 1/8 > 1/10 Acyclovir 1/8 > 1/10 Zosyn 1/10 >>    SIGNIFICANT EVENTS:   LINES/TUBES: 1/8 ETT >   DISCUSSION: 55 y.o female with multiple sclerosis who lives in a SNF and has recurrent UTI's was admitted from the [facility] ER with acute encephalopathy of uncertain etiology.  Ddx includes polypharmacy, cipro effect, non-convulsive status epilepticus, no clear etiology right now.  Doesn't appear septic.  Neuro work up ongoing  ASSESSMENT / PLAN:  PULMONARY A: Acute respiratory failure with hypoxemia P:   Full vent support vap bundle No plans extubate until MS clears. Her weakness due to multiple sclerosis will  complicate weaning and extubation. Will likely need to broach subject of tracheostomy if no improvement in next few days  CARDIOVASCULAR A:  HTN P:  Tele Increased coreg to 18.75 bid on 1/10 Added home spironolactone 1/10   RENAL A:   Hypernatremia AKI uncertain etiology, improving P:   Renal dose meds IVF Monitor BMET and UOP Replace electrolytes as needed Free water > increase on 1/13   GASTROINTESTINAL A:   No acute issues P:   OG tube pepcid Tube feedings  HEMATOLOGIC A:   No acute issues P:  Monitor for bleeding dvt prophylaxis  INFECTIOUS A:   Recurrent UTI, pyuria on admission, recent acetobacter Coag neg staph 1 of 2, likely contaminant P:   D/c vanco + acyclovir on 1/10 D/c ceftaz 1/10 given potential for neuro effects Re-send urine cx now Started zosyn 1/10 for presumed residual acinetobacter UTI; does not have the neuro effects that can be seen w cephalosporins.    ENDOCRINE A:   No acute issues   P:   Monitor glucose  NEUROLOGIC A:   Acute encephalopathy, LP, MRI and EEG non-diagnostic. Consider UTI / sepsis, effects of meds including cephalosporin encephalopathy. Appears to be slowly improving 1/11 thru 1/13. Also considering adrenal insufficiency following recent infxn. ? Seizures > EEG unclear P:   Antiepileptics per Neuro, probably start to wean them down if EEG improving, discussed with Dr Leonel Ramsay Hold most home meds Need to get Na corrected  Changed abx to zosyn on 1/10 Empiric hydrocort 60 q6h added 1/11 Keep baclofen > dose to half on 1/10 RASS goal: 0 Intermittent fentanyl    FAMILY  - Updates: Updated at bedside 1/9  - Inter-disciplinary family meet or Palliative Care meeting due by:  07/16/14  My cc time 110 minutes  Baltazar Apo, MD, PhD 07/15/2015, 10:17 AM [facility] [phone] or if no answer [phone]

## 2015-07-15 NOTE — Progress Notes (Signed)
Nutrition Follow Up  DOCUMENTATION CODES:   Obesity unspecified  INTERVENTION:   Continue Vital High Protein formula at goal rate of 50 ml/hr  TF regimen providing 1200 kcals, 105 gm protein, 1003 ml of free water  NUTRITION DIAGNOSIS:   Inadequate oral intake related to inability to eat as evidenced by NPO status, ongoing  GOAL:   Provide needs based on ASPEN/SCCM guidelines, met  MONITOR:   TF tolerance, Vent status, Labs, Weight trends, I & O's  ASSESSMENT:   55 y/o Female with a history of MS and multiple admissions for foley catheter associated UTI was admitted from [facility] hospital on 07/09/2014 after being brought in by EMS from her SNF. She was just discharged from [facility] on 1/6 after being treated for a UTI from acinetobacter. She was discharged on Cipro. Today she was found nearly unresponsive in her SNF. No family at bedside, no one available to provide history.    Patient s/p procedure 1/9: DG FLUORO FOR LUMBAR PUNCTURE   Patient is currently intubated on ventilator support Temp (24hrs), Avg:99.4 F (37.4 C), Min:98.6 F (37 C), Max:100.3 F (37.9 C)   EEG showed moderate - severe generalized cerebral dysfunction (encephalopathy).   Vital High Protein formula currently infusing via OGT at goal rate of 50 ml/hr providing 1200 kcals, 105 gm protein, 1003 ml of free water.  Tolerating well.   Diet Order:  Diet NPO time specified  Skin:  Wound (see comment) (MSAD to bilateral buttocks)  Last BM:  1/12  Height:   Ht Readings from Last 1 Encounters:  07/10/15 _0  (1.575 m)    Weight:   Wt Readings from Last 1 Encounters:  07/15/15 207 lb 7.3 oz (94.1 kg)    Ideal Body Weight:  50 kg  BMI:  Body mass index is 37.93 kg/(m^2).  Estimated Nutritional Needs:   Kcal:  4584-8350  Protein:  100-110 gm  Fluid:  per MD  EDUCATION NEEDS:   No education needs identified at this time  Arthur Holms, RD, LDN Pager #:  [phone] After-Hours Pager #: [phone]

## 2015-07-15 NOTE — Progress Notes (Signed)
[facility] ADULT ICU REPLACEMENT PROTOCOL FOR AM LAB REPLACEMENT ONLY  The patient does apply for the [facility] Adult ICU Electrolyte Replacment Protocol based on the criteria listed below:   1. Is GFR >/= 40 ml/min? Yes.    Patient's GFR today is >60 2. Is urine output >/= 0.5 ml/kg/hr for the last 6 hours? Yes.   Patient's UOP is 1.59 ml/kg/hr 3. Is BUN < 60 mg/dL? Yes.    Patient's BUN today is 27 4. Abnormal electrolyte K 3.2 5. Ordered repletion with: per protocol 6. If a panic level lab has been reported, has the CCM MD in charge been notified? Yes.  .   Physician:  E Deterding  Christeen Douglas 07/15/2015 6:40 AM

## 2015-07-16 ENCOUNTER — Inpatient Hospital Stay (HOSPITAL_COMMUNITY): Payer: Medicare Other

## 2015-07-16 DIAGNOSIS — E870 Hyperosmolality and hypernatremia: Secondary | ICD-10-CM

## 2015-07-16 LAB — BASIC METABOLIC PANEL
Anion gap: 11 (ref 5–15)
BUN: 24 mg/dL — ABNORMAL HIGH (ref 6–20)
CALCIUM: 9.2 mg/dL (ref 8.9–10.3)
CHLORIDE: 118 mmol/L — AB (ref 101–111)
CO2: 27 mmol/L (ref 22–32)
Creatinine, Ser: 0.8 mg/dL (ref 0.44–1.00)
GFR calc non Af Amer: >60 mL/min (ref >60)
Glucose, Bld: 171 mg/dL — ABNORMAL HIGH (ref 65–99)
POTASSIUM: 3.2 mmol/L — AB (ref 3.5–5.1)
Sodium: 156 mmol/L — ABNORMAL HIGH (ref 135–145)

## 2015-07-16 LAB — CSF CULTURE

## 2015-07-16 LAB — GLUCOSE, CAPILLARY
GLUCOSE-CAPILLARY: 141 mg/dL — AB (ref 65–99)
GLUCOSE-CAPILLARY: 143 mg/dL — AB (ref 65–99)
GLUCOSE-CAPILLARY: 151 mg/dL — AB (ref 65–99)
Glucose-Capillary: 122 mg/dL — ABNORMAL HIGH (ref 65–99)
Glucose-Capillary: 155 mg/dL — ABNORMAL HIGH (ref 65–99)
Glucose-Capillary: 149 mg/dL — ABNORMAL HIGH (ref 65–99)

## 2015-07-16 LAB — CBC
HEMATOCRIT: 29 % — AB (ref 36.0–46.0)
HEMOGLOBIN: 9.5 g/dL — AB (ref 12.0–15.0)
MCH: 27.1 pg (ref 26.0–34.0)
MCHC: 32.8 g/dL (ref 30.0–36.0)
MCV: 82.6 fL (ref 78.0–100.0)
Platelets: 207 10*3/uL (ref 150–400)
RBC: 3.51 MIL/uL — AB (ref 3.87–5.11)
RDW: 16.9 % — ABNORMAL HIGH (ref 11.5–15.5)
WBC: 9.5 10*3/uL (ref 4.0–10.5)

## 2015-07-16 LAB — CSF CULTURE W GRAM STAIN: Culture: NO GROWTH

## 2015-07-16 MED ORDER — POTASSIUM CHLORIDE 20 MEQ/15ML (10%) PO SOLN
30.0000 meq | ORAL | Status: AC
  Administered 2015-07-16 (×2): 30 meq
  Filled 2015-07-16 (×2): qty 30

## 2015-07-16 MED ORDER — CARVEDILOL 25 MG PO TABS
25.0000 mg | ORAL_TABLET | Freq: Two times a day (BID) | ORAL | Status: DC
  Administered 2015-07-16 – 2015-07-22 (×12): 25 mg
  Filled 2015-07-16 (×13): qty 1

## 2015-07-16 MED ORDER — BACLOFEN 10 MG PO TABS
5.0000 mg | ORAL_TABLET | Freq: Three times a day (TID) | ORAL | Status: DC
  Administered 2015-07-16 – 2015-07-25 (×25): 5 mg via ORAL
  Filled 2015-07-16 (×27): qty 1

## 2015-07-16 NOTE — Progress Notes (Signed)
PULMONARY / CRITICAL CARE MEDICINE   Name: Shannon Garrett MRN: [id number] DOB: 1960-10-29    ADMISSION DATE:  07/10/2015 CONSULTATION DATE:  07/10/2015  REFERRING MD:  Stark Jock  CHIEF COMPLAINT:  Acute encephalopathy  HISTORY OF PRESENT ILLNESS:   55 y/o female with a history of MS and multiple admissions for foley catheter associated UTI was admitted from [facility] hospital on 07/09/2014 after being brought in by EMS from her SNF.  She was just discharged from [facility] on 1/6 after being treated for a UTI from acinetobacter.  She was discharged on Cipro.  Today she was found nearly unresponsive in her SNF.  No family at bedside, no one available to provide history.     SUBJECTIVE:  Continues to improve > more active this am, stronger. Followed commands quickly   VITAL SIGNS: BP 157/94 mmHg  Pulse 106  Temp(Src) 98.6 F (37 C) (Oral)  Resp 17  Ht 5\' 2"  (1.575 m)  Wt 93.7 kg (206 lb 9.1 oz)  BMI 37.77 kg/m2  SpO2 100%  HEMODYNAMICS:    VENTILATOR SETTINGS: Vent Mode:  [-] PRVC FiO2 (%):  [40 %] 40 % Set Rate:  [16 bmp] 16 bmp Vt Set:  [450 mL] 450 mL PEEP:  [5 cmH20] 5 cmH20 Pressure Support:  [10 cmH20] 10 cmH20 Plateau Pressure:  [17 cmH20-23 cmH20] 17 cmH20  INTAKE / OUTPUT: I/O last 3 completed shifts: In: 7284.3 [I.V.:2639.3; NG/GT:4260; IV Piggyback:385] Out: 4610 [Urine:4610]  PHYSICAL EXAMINATION: General:  Obese, on vent Neuro:  Eyes open, tracks and turns to voice, follows commands, weak cough HEENT:  NCAT, ETTin place, Pupils sluggish Cardiovascular:  RRR, no mgr Lungs:  CTA B Abdomen:  BS+, soft, non tender  Musculoskeletal:  Diminished tone but sedated Skin:  Edema all over  LABS:  BMET  Recent Labs Lab 07/15/15 0515 07/15/15 1800 07/16/15 0500  NA 158* 158* 156*  K 3.2* 3.6 3.2*  CL 125* 123* 118*  CO2 25 25 27   BUN 27* 24* 24*  CREATININE 0.89 0.80 0.80  GLUCOSE 145* 164* 171*    Electrolytes  Recent Labs Lab 07/11/15 0240   07/13/15 0330  07/15/15 0515 07/15/15 1800 07/16/15 0500  CALCIUM 9.1  < > 8.2*  < > 8.9 9.1 9.2  MG 1.9  --  1.9  --   --   --   --   PHOS 2.9  --  2.6  --   --   --   --   < > = values in this interval not displayed.  CBC  Recent Labs Lab 07/14/15 0415 07/15/15 0515 07/16/15 0500  WBC 9.0 9.2 9.5  HGB 10.7* 9.9* 9.5*  HCT 32.6* 30.2* 29.0*  PLT 270 232 207    Coag's No results for input(s): APTT, INR in the last 168 hours.  Sepsis Markers  Recent Labs Lab 07/10/15 1507 07/10/15 2020  LATICACIDVEN 0.7 1.1  PROCALCITON  --  <0.10    ABG  Recent Labs Lab 07/10/15 1524 07/10/15 1801  PHART 7.336* 7.420  PCO2ART 56.7* 41.2  PO2ART 412.0* 417.0*    Liver Enzymes  Recent Labs Lab 07/10/15 1507  AST 17  ALT 31  ALKPHOS 166*  BILITOT 0.6  ALBUMIN 3.6    Cardiac Enzymes No results for input(s): TROPONINI, PROBNP in the last 168 hours.  Glucose  Recent Labs Lab 07/15/15 0814 07/15/15 1205 07/15/15 1543 07/15/15 1955 07/16/15 0014 07/16/15 0352  GLUCAP 126* 138* 142* 147* 151* 143*  Imaging No results found.   STUDIES:  1/8 CT head > no acute changes 1/9 MRI Brain >> chronic MS changes but nothing acute  CULTURES: 1/8 Blood X2 > 1 of 2 coag neg staph >>  CSF 1/9 >> normal Urine 1/10 >> negative  ANTIBIOTICS: Vanc 1/8 > 1/10 Ceftriaxone x1 1/8 Ceftaz 1/8 > 1/10 Acyclovir 1/8 > 1/10 Zosyn 1/10 >> 1/14   SIGNIFICANT EVENTS:   LINES/TUBES: 1/8 ETT >   DISCUSSION: 55 y.o female with multiple sclerosis who lives in a SNF and has recurrent UTI's was admitted from the [facility] ER with acute encephalopathy of uncertain etiology. Improving  ASSESSMENT / PLAN:  PULMONARY A: Acute respiratory failure with hypoxemia P:   Full vent support vap bundle No plans extubate until MS clears. Her weakness due to multiple sclerosis will complicate weaning and extubation. Will likely need to broach subject of tracheostomy if no improvement  in next few days > possible extubation 1/14 or 15  CARDIOVASCULAR A:  HTN P:  Tele Increase coreg to 25 bid on 1/14 Added home spironolactone 1/10   RENAL A:   Hypernatremia AKI uncertain etiology, improving P:   Renal dose meds IVF Monitor BMET and UOP Replace electrolytes as needed Free water > increased on 1/13   GASTROINTESTINAL A:   No acute issues P:   OG tube pepcid Tube feedings  HEMATOLOGIC A:   No acute issues P:  Monitor for bleeding dvt prophylaxis  INFECTIOUS A:   Recurrent UTI, pyuria on admission, recent acetobacter Coag neg staph 1 of 2, likely contaminant P:   D/c vanco + acyclovir on 1/10 D/c ceftaz 1/10 given potential for neuro effects Re-send urine cx now Started zosyn 1/10 for presumed residual acinetobacter UTI; does not have the neuro effects that can be seen w cephalosporins. Plan d/c on 1/14 (7 days total abx)   ENDOCRINE A:   Possible adrenal insuff s/p recent infection P:   Monitor glucose hydrocort  NEUROLOGIC A:   Acute encephalopathy, LP, MRI and EEG non-diagnostic. Consider UTI / sepsis, effects of meds including cephalosporin encephalopathy. Appears to be slowly improving 1/11 thru 1/13. Also considering adrenal insufficiency following recent infxn. ? Seizures > EEG unclear P:   Antiepileptics per Neuro, weaning per Neuro, discussed with Dr Leonel Ramsay Hold most home meds Need to get Na corrected  Changed abx to zosyn on 1/10, completed Empiric hydrocort 60 q6h added 1/11 Keep baclofen > dose to half on 1/10 RASS goal: 0 Intermittent fentanyl    FAMILY  - Updates: Updated at bedside 1/9  - Inter-disciplinary family meet or Palliative Care meeting due by:  07/16/14  My cc time 14 minutes  Baltazar Apo, MD, PhD 07/16/2015, 7:01 AM [place] [facility] [phone] or if no answer [phone]

## 2015-07-16 NOTE — Progress Notes (Signed)
[facility] ADULT ICU REPLACEMENT PROTOCOL FOR AM LAB REPLACEMENT ONLY  The patient does apply for the [facility] Adult ICU Electrolyte Replacment Protocol based on the criteria listed below:   1. Is GFR >/= 40 ml/min? Yes.    Patient's GFR today is >60 2. Is urine output >/= 0.5 ml/kg/hr for the last 6 hours? Yes.   Patient's UOP is 0.9 ml/kg/hr 3. Is BUN < 60 mg/dL? Yes.    Patient's BUN today is 24 4. Abnormal electrolyte(s): K+3.2 5. Ordered repletion with: Protocol 6. If a panic level lab has been reported, has the CCM MD in charge been notified? No..   Physician:  E Deterding  Concepcion Living [facility] 07/16/2015 6:14 AM

## 2015-07-17 LAB — BASIC METABOLIC PANEL
ANION GAP: 6 (ref 5–15)
BUN: 19 mg/dL (ref 6–20)
CALCIUM: 8.8 mg/dL — AB (ref 8.9–10.3)
CHLORIDE: 112 mmol/L — AB (ref 101–111)
CO2: 28 mmol/L (ref 22–32)
Creatinine, Ser: 0.62 mg/dL (ref 0.44–1.00)
GFR calc Af Amer: >60 mL/min (ref >60)
GFR calc non Af Amer: >60 mL/min (ref >60)
GLUCOSE: 161 mg/dL — AB (ref 65–99)
POTASSIUM: 3.2 mmol/L — AB (ref 3.5–5.1)
Sodium: 146 mmol/L — ABNORMAL HIGH (ref 135–145)

## 2015-07-17 LAB — CBC
HCT: 27.9 % — ABNORMAL LOW (ref 36.0–46.0)
HEMOGLOBIN: 9.3 g/dL — AB (ref 12.0–15.0)
MCH: 27 pg (ref 26.0–34.0)
MCHC: 33.3 g/dL (ref 30.0–36.0)
MCV: 81.1 fL (ref 78.0–100.0)
Platelets: 194 10*3/uL (ref 150–400)
RBC: 3.44 MIL/uL — AB (ref 3.87–5.11)
RDW: 16.7 % — ABNORMAL HIGH (ref 11.5–15.5)
WBC: 11.8 10*3/uL — AB (ref 4.0–10.5)

## 2015-07-17 LAB — GLUCOSE, CAPILLARY
GLUCOSE-CAPILLARY: 124 mg/dL — AB (ref 65–99)
GLUCOSE-CAPILLARY: 138 mg/dL — AB (ref 65–99)
GLUCOSE-CAPILLARY: 138 mg/dL — AB (ref 65–99)
Glucose-Capillary: 144 mg/dL — ABNORMAL HIGH (ref 65–99)
Glucose-Capillary: 148 mg/dL — ABNORMAL HIGH (ref 65–99)
Glucose-Capillary: 155 mg/dL — ABNORMAL HIGH (ref 65–99)

## 2015-07-17 LAB — PHOSPHORUS: Phosphorus: 2.5 mg/dL (ref 2.5–4.6)

## 2015-07-17 LAB — MAGNESIUM: Magnesium: 1.8 mg/dL (ref 1.7–2.4)

## 2015-07-17 MED ORDER — FREE WATER
300.0000 mL | Status: DC
  Administered 2015-07-17 – 2015-07-19 (×14): 300 mL

## 2015-07-17 MED ORDER — POTASSIUM CHLORIDE 20 MEQ/15ML (10%) PO SOLN
30.0000 meq | ORAL | Status: AC
  Administered 2015-07-17 (×2): 30 meq
  Filled 2015-07-17 (×2): qty 30

## 2015-07-17 NOTE — Progress Notes (Signed)
[facility] ADULT ICU REPLACEMENT PROTOCOL FOR AM LAB REPLACEMENT ONLY  The patient does apply for the [facility] Adult ICU Electrolyte Replacment Protocol based on the criteria listed below:   1. Is GFR >/= 40 ml/min? Yes.    Patient's GFR today is >60 2. Is urine output >/= 0.5 ml/kg/hr for the last 6 hours? Yes.   Patient's UOP is 1.2 ml/kg/hr 3. Is BUN < 60 mg/dL? Yes.    Patient's BUN today is 19 4. Abnormal electrolyte(s): K+3.2 5. Ordered repletion with: protocol 6. If a panic level lab has been reported, has the CCM MD in charge been notified? No..   Physician:  Colon Flattery 07/17/2015 6:27 AM

## 2015-07-17 NOTE — Progress Notes (Signed)
PULMONARY / CRITICAL CARE MEDICINE   Name: Shannon Garrett MRN: [id number] DOB: Jun 28, 1961    ADMISSION DATE:  07/10/2015 CONSULTATION DATE:  07/10/2015  REFERRING MD:  Stark Jock  CHIEF COMPLAINT:  Acute encephalopathy  HISTORY OF PRESENT ILLNESS:   55 y/o female with a history of MS and multiple admissions for foley catheter associated UTI was admitted from [facility] hospital on 07/09/2014 after being brought in by EMS from her SNF.  She was just discharged from [facility] on 1/6 after being treated for a UTI from acinetobacter.  She was discharged on Cipro.  Today she was found nearly unresponsive in her SNF.  No family at bedside, no one available to provide history.     SUBJECTIVE:  Awake and following commands Did some PSV 1/14 but weak cough, lots of secretions.    VITAL SIGNS: BP 155/94 mmHg  Pulse 102  Temp(Src) 99.8 F (37.7 C) (Axillary)  Resp 20  Ht 5\' 2"  (1.575 m)  Wt 96 kg (211 lb 10.3 oz)  BMI 38.70 kg/m2  SpO2 100%  HEMODYNAMICS:    VENTILATOR SETTINGS: Vent Mode:  [-] PRVC FiO2 (%):  [40 %] 40 % Set Rate:  [12 bmp] 12 bmp Vt Set:  [450 mL] 450 mL PEEP:  [5 cmH20] 5 cmH20 Pressure Support:  [5 cmH20-10 cmH20] 10 cmH20 Plateau Pressure:  [18 cmH20-21 cmH20] 18 cmH20  INTAKE / OUTPUT: I/O last 3 completed shifts: In: 15 [I.V.:2738; NG/GT:4420; IV Piggyback:250] Out: [id number] [Urine:4845]  PHYSICAL EXAMINATION: General:  Obese, on vent Neuro:  Eyes open, tracks and turns to voice, follows commands, weak cough HEENT:  NCAT, ETTin place, Pupils sluggish Cardiovascular:  RRR, no mgr Lungs:  CTA B Abdomen:  BS+, soft, non tender  Musculoskeletal:  Diminished tone but sedated Skin:  Edema all over  LABS:  BMET  Recent Labs Lab 07/15/15 1800 07/16/15 0500 07/17/15 0520  NA 158* 156* 146*  K 3.6 3.2* 3.2*  CL 123* 118* 112*  CO2 25 27 28   BUN 24* 24* 19  CREATININE 0.80 0.80 0.62  GLUCOSE 164* 171* 161*    Electrolytes  Recent Labs Lab  07/11/15 0240  07/13/15 0330  07/15/15 1800 07/16/15 0500 07/17/15 0520  CALCIUM 9.1  < > 8.2*  < > 9.1 9.2 8.8*  MG 1.9  --  1.9  --   --   --  1.8  PHOS 2.9  --  2.6  --   --   --  2.5  < > = values in this interval not displayed.  CBC  Recent Labs Lab 07/15/15 0515 07/16/15 0500 07/17/15 0520  WBC 9.2 9.5 11.8*  HGB 9.9* 9.5* 9.3*  HCT 30.2* 29.0* 27.9*  PLT 232 207 194    Coag's No results for input(s): APTT, INR in the last 168 hours.  Sepsis Markers  Recent Labs Lab 07/10/15 1507 07/10/15 2020  LATICACIDVEN 0.7 1.1  PROCALCITON  --  <0.10    ABG  Recent Labs Lab 07/10/15 1524 07/10/15 1801  PHART 7.336* 7.420  PCO2ART 56.7* 41.2  PO2ART 412.0* 417.0*    Liver Enzymes  Recent Labs Lab 07/10/15 1507  AST 17  ALT 31  ALKPHOS 166*  BILITOT 0.6  ALBUMIN 3.6    Cardiac Enzymes No results for input(s): TROPONINI, PROBNP in the last 168 hours.  Glucose  Recent Labs Lab 07/16/15 0838 07/16/15 1248 07/16/15 1627 07/16/15 1946 07/17/15 0027 07/17/15 0411  GLUCAP 122* 155* 149* 141* 138* 144*  Imaging No results found.   STUDIES:  1/8 CT head > no acute changes 1/9 MRI Brain >> chronic MS changes but nothing acute  CULTURES: 1/8 Blood X2 > 1 of 2 coag neg staph >>  CSF 1/9 >> normal Urine 1/10 >> negative  ANTIBIOTICS: Vanc 1/8 > 1/10 Ceftriaxone x1 1/8 Ceftaz 1/8 > 1/10 Acyclovir 1/8 > 1/10 Zosyn 1/10 >> 1/14   SIGNIFICANT EVENTS:   LINES/TUBES: 1/8 ETT >   DISCUSSION: 55 y.o female with multiple sclerosis who lives in a SNF and has recurrent UTI's was admitted from the [facility] ER with acute encephalopathy of uncertain etiology. Improved significantly by 1/13-1/14  ASSESSMENT / PLAN:  PULMONARY A: Acute respiratory failure with hypoxemia P:   Full vent support vap bundle Encephalopathy much improved.  Her weakness due to multiple sclerosis will complicate weaning and extubation. Will likely need to broach  subject of tracheostomy if no improvement by 1/16  CARDIOVASCULAR A:  HTN P:  Tele Increased coreg to 25 bid on 1/14 Added home spironolactone 1/10   RENAL A:   Hypernatremia Hypokalemia AKI uncertain etiology, improving P:   Renal dose meds IVF Monitor BMET and UOP Replace electrolytes as needed Free water > decreased on 1/15   GASTROINTESTINAL A:   No acute issues P:   OG tube pepcid Tube feedings  HEMATOLOGIC A:   No acute issues P:  Monitor for bleeding dvt prophylaxis  INFECTIOUS A:   Recurrent UTI, pyuria on admission, recent acetobacter Coag neg staph 1 of 2, likely contaminant P:   D/c vanco + acyclovir on 1/10 D/c ceftaz 1/10 given potential for neuro effects Re-send urine cx now Completed zosyn 1/10 - 1/14 for presumed residual acinetobacter UTI; does not have the neuro effects that can be seen w cephalosporins.    ENDOCRINE A:   Possible adrenal insuff s/p recent infection P:   Monitor glucose hydrocort  NEUROLOGIC A:   Acute encephalopathy, LP, MRI and EEG non-diagnostic. Consider UTI / sepsis, effects of meds including cephalosporin encephalopathy. Also considering adrenal insufficiency following recent infxn, treated w steroids ? Seizures > EEG unclear P:   Antiepileptics per Neuro, weaning per Neuro, discussed with Dr Leonel Ramsay, goal to off Hold most home meds Correcting hypernatremia Changed abx to zosyn on 1/10, completed Empiric hydrocort 60 q6h added 1/11 Keep baclofen > decreased dose RASS goal: 0 Intermittent fentanyl    FAMILY  - Updates: Updated at bedside 1/9  - Inter-disciplinary family meet or Palliative Care meeting due by:  07/16/14  My cc time 37 minutes  Baltazar Apo, MD, PhD 07/17/2015, 7:11 AM  Pulmonary and Critical Care [phone] or if no answer [phone]

## 2015-07-17 NOTE — Progress Notes (Addendum)
Subjective: Markedly improved,   Exam: Filed Vitals:   07/17/15 0805 07/17/15 0833  BP: 152/88   Pulse: 107   Temp:  100.4 F (38 C)  Resp: 20    Gen: In bed, NAD Resp: non-labored breathing, no acute distress Abd: soft, nt  Neuro: MS: awake, following commands and mouthign words.  R pupil larger than left.  She has a spastic quadripareiss, but is able to wiggle toes, and is able to squeeze hands bialterally.    Impression: 55 yo F with acute encephalopathy several days after starting treatment for acinetobacter UTI.   Both cipro and cefepime can be associated with encephalopathy as well. She has been changed to zosyn.   At this point, my suspicion is for secondary or tertiary cortisol deficiency in the setting of infection as a cause for her encephalopathy. It is difficult to rule out antibiotic related encephalopathy, however.   Though her EEGs were concerning, there has not been any definite evidence for seizure, and I will discontinue AEDs. Her repeat EEG was much improved. If she were to relapse, then this may need to be revisited.   Recommendations: 1) Stop dilantin 2) stress dose steroids per CCM 3) Neurology to sign off at this time. Please call with any further questions or concerns.   Roland Rack, MD [facility] [phone]  If 7pm- 7am, please page neurology on call as listed in [place].

## 2015-07-18 DIAGNOSIS — R4182 Altered mental status, unspecified: Secondary | ICD-10-CM

## 2015-07-18 LAB — BASIC METABOLIC PANEL
ANION GAP: 6 (ref 5–15)
BUN: 18 mg/dL (ref 6–20)
CO2: 27 mmol/L (ref 22–32)
Calcium: 8.6 mg/dL — ABNORMAL LOW (ref 8.9–10.3)
Chloride: 110 mmol/L (ref 101–111)
Creatinine, Ser: 0.58 mg/dL (ref 0.44–1.00)
Glucose, Bld: 152 mg/dL — ABNORMAL HIGH (ref 65–99)
POTASSIUM: 3.4 mmol/L — AB (ref 3.5–5.1)
SODIUM: 143 mmol/L (ref 135–145)

## 2015-07-18 LAB — GLUCOSE, CAPILLARY
GLUCOSE-CAPILLARY: 137 mg/dL — AB (ref 65–99)
GLUCOSE-CAPILLARY: 141 mg/dL — AB (ref 65–99)
GLUCOSE-CAPILLARY: 141 mg/dL — AB (ref 65–99)
Glucose-Capillary: 134 mg/dL — ABNORMAL HIGH (ref 65–99)
Glucose-Capillary: 115 mg/dL — ABNORMAL HIGH (ref 65–99)
Glucose-Capillary: 116 mg/dL — ABNORMAL HIGH (ref 65–99)

## 2015-07-18 MED ORDER — POTASSIUM CHLORIDE 20 MEQ/15ML (10%) PO SOLN
20.0000 meq | ORAL | Status: AC
  Administered 2015-07-18 (×2): 20 meq
  Filled 2015-07-18 (×2): qty 15

## 2015-07-18 NOTE — Progress Notes (Signed)
Utilization review completed.  

## 2015-07-18 NOTE — Progress Notes (Signed)
[facility] ADULT ICU REPLACEMENT PROTOCOL FOR AM LAB REPLACEMENT ONLY  The patient does apply for the [facility] Adult ICU Electrolyte Replacment Protocol based on the criteria listed below:   1. Is GFR >/= 40 ml/min? Yes.    Patient's GFR today is >60 2. Is urine output >/= 0.5 ml/kg/hr for the last 6 hours? Yes.   Patient's UOP is 0.5 ml/kg/hr 3. Is BUN < 60 mg/dL? Yes.    Patient's BUN today is 18 4. Abnormal electrolyte(s): K+3.4 5. Ordered repletion with: Protocol 6. If a panic level lab has been reported, has the CCM MD in charge been notified? No..   Physician:  Rozelle Logan, [place] 07/18/2015 5:57 AM

## 2015-07-18 NOTE — Care Management Important Message (Signed)
Important Message  Patient Details  Name: Shannon Garrett MRN: [id number] Date of Birth: [birth year]   Medicare Important Message Given:  Yes    Loann Quill 07/18/2015, 12:35 PM

## 2015-07-18 NOTE — Progress Notes (Signed)
PULMONARY / CRITICAL CARE MEDICINE   Name: Shannon Garrett MRN: [id number] DOB: March 25, 1961    ADMISSION DATE:  07/10/2015 CONSULTATION DATE:  07/10/2015  REFERRING MD:  Stark Jock  CHIEF COMPLAINT:  Acute encephalopathy  HISTORY OF PRESENT ILLNESS:   55 y/o female with a history of MS and multiple admissions for foley catheter associated UTI was admitted from [facility] hospital on 07/09/2014 after being brought in by EMS from her SNF.  She was just discharged from [facility] on 1/6 after being treated for a UTI from acinetobacter.  She was discharged on Cipro.  Today she was found nearly unresponsive in her SNF.  No family at bedside, no one available to provide history.     SUBJECTIVE:  Awake and following commands Did some PSV 1/14 but weak cough, lots of secretions.    VITAL SIGNS: BP 145/80 mmHg  Pulse 94  Temp(Src) 100.3 F (37.9 C) (Axillary)  Resp 25  Ht 5\' 2"  (1.575 m)  Wt 210 lb 5.1 oz (95.4 kg)  BMI 38.46 kg/m2  SpO2 100%  HEMODYNAMICS:    VENTILATOR SETTINGS: Vent Mode:  [-] PSV;CPAP FiO2 (%):  [40 %] 40 % Set Rate:  [14 bmp] 14 bmp Vt Set:  [450 mL] 450 mL PEEP:  [5 cmH20] 5 cmH20 Pressure Support:  [5 cmH20] 5 cmH20 Plateau Pressure:  [10 [id number] cmH20] 21 cmH20  INTAKE / OUTPUT: I/O last 3 completed shifts: In: 6515 [I.V.:2775; NG/GT:3590; IV Piggyback:150] Out: [id number] [Urine:3890]  PHYSICAL EXAMINATION: General:  Obese, on vent Neuro:  Eyes open, tracks and turns to voice, follows commands, weak cough HEENT:  NCAT, ETTin place, Pupils sluggish Cardiovascular:  RRR, no mgr Lungs:  CTA B Abdomen:  BS+, soft, non tender  Musculoskeletal:  Diminished tone but sedated Skin:  Edema all over  LABS:  BMET  Recent Labs Lab 07/16/15 0500 07/17/15 0520 07/18/15 0457  NA 156* 146* 143  K 3.2* 3.2* 3.4*  CL 118* 112* 110  CO2 27 28 27   BUN 24* 19 18  CREATININE 0.80 0.62 0.58  GLUCOSE 171* 161* 152*    Electrolytes  Recent Labs Lab 07/13/15 0330   07/16/15 0500 07/17/15 0520 07/18/15 0457  CALCIUM 8.2*  < > 9.2 8.8* 8.6*  MG 1.9  --   --  1.8  --   PHOS 2.6  --   --  2.5  --   < > = values in this interval not displayed.  CBC  Recent Labs Lab 07/15/15 0515 07/16/15 0500 07/17/15 0520  WBC 9.2 9.5 11.8*  HGB 9.9* 9.5* 9.3*  HCT 30.2* 29.0* 27.9*  PLT 232 207 194    Coag's No results for input(s): APTT, INR in the last 168 hours.  Sepsis Markers No results for input(s): LATICACIDVEN, PROCALCITON, O2SATVEN in the last 168 hours.  ABG No results for input(s): PHART, PCO2ART, PO2ART in the last 168 hours.  Liver Enzymes No results for input(s): AST, ALT, ALKPHOS, BILITOT, ALBUMIN in the last 168 hours.  Cardiac Enzymes No results for input(s): TROPONINI, PROBNP in the last 168 hours.  Glucose  Recent Labs Lab 07/17/15 0831 07/17/15 1208 07/17/15 1603 07/17/15 2002 07/18/15 0016 07/18/15 0407  GLUCAP 148* 124* 138* 155* 141* 137*    Imaging No results found.   STUDIES:  1/8 CT head > no acute changes 1/9 MRI Brain >> chronic MS changes but nothing acute  CULTURES: 1/8 Blood X2 > 1 of 2 coag neg staph >>  CSF 1/9 >>  normal Urine 1/10 >> negative  ANTIBIOTICS: Vanc 1/8 > 1/10 Ceftriaxone x1 1/8 Ceftaz 1/8 > 1/10 Acyclovir 1/8 > 1/10 Zosyn 1/10 >> 1/14   SIGNIFICANT EVENTS:   LINES/TUBES: 1/8 ETT >   DISCUSSION: 55 y.o female with multiple sclerosis who lives in a SNF and has recurrent UTI's was admitted from the [facility] ER with acute encephalopathy of uncertain etiology. Improved significantly by 1/13-1/14  ASSESSMENT / PLAN:  PULMONARY A: Acute respiratory failure with hypoxemia P:   Full vent support vap bundle Encephalopathy much improved.  Her weakness due to multiple sclerosis will complicate weaning and extubation. Will likely need to broach subject of tracheostomy if no improvement.  CARDIOVASCULAR A:  HTN P:  Tele Increased coreg to 25 bid on 1/14 Added home  spironolactone 1/10  RENAL A:   Hypernatremia Hypokalemia AKI uncertain etiology, improving P:   Renal dose meds IVF Monitor BMET and UOP Replace electrolytes as needed Free water > decreased on 1/15   GASTROINTESTINAL A:   No acute issues P:   OG tube pepcid Tube feedings  HEMATOLOGIC A:   No acute issues P:  Monitor for bleeding dvt prophylaxis  INFECTIOUS A:   Recurrent UTI, pyuria on admission, recent acetobacter Coag neg staph 1 of 2, likely contaminant P:   D/c vanco + acyclovir on 1/10 D/c ceftaz 1/10 given potential for neuro effects Completed zosyn 1/10 - 1/14 for presumed residual acinetobacter UTI; does not have the neuro effects that can be seen w cephalosporins.    ENDOCRINE A:   Possible adrenal insuff s/p recent infection P:   Monitor glucose Hydrocortisone. Will start weaning to 50 mg q12.  NEUROLOGIC A:   Acute encephalopathy, LP, MRI and EEG non-diagnostic. Consider UTI / sepsis, effects of meds including cephalosporin encephalopathy. Also considering adrenal insufficiency following recent infxn, treated w steroids ? Seizures > EEG unclear P:   Antiepileptics per Neuro, weaning per Neuro, discussed with Dr Leonel Ramsay, goal to off Hold most home meds Correcting hypernatremia Changed abx to zosyn on 1/10, completed Empiric hydrocort added 1/11 Keep baclofen > decreased dose RASS goal: 0 Intermittent fentanyl    FAMILY  - Updates: Updated at bedside 1/9. No family at bedside 1/16 and no answer on phone.  - Inter-disciplinary family meet or Palliative Care meeting due by:  07/16/14  Critical care time time 35 minutes.  Marshell Garfinkel MD [facility] Pager [phone] If no answer or after 3pm call: [phone] 07/18/2015, 9:36 AM

## 2015-07-19 ENCOUNTER — Inpatient Hospital Stay (HOSPITAL_COMMUNITY): Payer: Medicare Other

## 2015-07-19 LAB — BASIC METABOLIC PANEL
ANION GAP: 7 (ref 5–15)
BUN: 15 mg/dL (ref 6–20)
CHLORIDE: 106 mmol/L (ref 101–111)
CO2: 27 mmol/L (ref 22–32)
Calcium: 8.7 mg/dL — ABNORMAL LOW (ref 8.9–10.3)
Creatinine, Ser: 0.56 mg/dL (ref 0.44–1.00)
GFR calc non Af Amer: >60 mL/min (ref >60)
Glucose, Bld: 145 mg/dL — ABNORMAL HIGH (ref 65–99)
POTASSIUM: 3.2 mmol/L — AB (ref 3.5–5.1)
Sodium: 140 mmol/L (ref 135–145)

## 2015-07-19 LAB — GLUCOSE, CAPILLARY
GLUCOSE-CAPILLARY: 154 mg/dL — AB (ref 65–99)
GLUCOSE-CAPILLARY: 102 mg/dL — AB (ref 65–99)
GLUCOSE-CAPILLARY: 91 mg/dL (ref 65–99)
GLUCOSE-CAPILLARY: 91 mg/dL (ref 65–99)
Glucose-Capillary: 113 mg/dL — ABNORMAL HIGH (ref 65–99)
Glucose-Capillary: 118 mg/dL — ABNORMAL HIGH (ref 65–99)
Glucose-Capillary: 132 mg/dL — ABNORMAL HIGH (ref 65–99)

## 2015-07-19 LAB — PHOSPHORUS: PHOSPHORUS: 2.5 mg/dL (ref 2.5–4.6)

## 2015-07-19 LAB — CBC
HEMATOCRIT: 25.1 % — AB (ref 36.0–46.0)
HEMOGLOBIN: 8.7 g/dL — AB (ref 12.0–15.0)
MCH: 27.6 pg (ref 26.0–34.0)
MCHC: 34.7 g/dL (ref 30.0–36.0)
MCV: 79.7 fL (ref 78.0–100.0)
Platelets: 175 10*3/uL (ref 150–400)
RBC: 3.15 MIL/uL — AB (ref 3.87–5.11)
RDW: 16.4 % — ABNORMAL HIGH (ref 11.5–15.5)
WBC: 11.4 10*3/uL — ABNORMAL HIGH (ref 4.0–10.5)

## 2015-07-19 LAB — MAGNESIUM: MAGNESIUM: 1.8 mg/dL (ref 1.7–2.4)

## 2015-07-19 MED ORDER — HYDROCORTISONE NA SUCCINATE PF 100 MG IJ SOLR
50.0000 mg | Freq: Two times a day (BID) | INTRAMUSCULAR | Status: DC
  Administered 2015-07-19 – 2015-07-22 (×6): 50 mg via INTRAVENOUS
  Filled 2015-07-19 (×7): qty 2

## 2015-07-19 MED ORDER — CETYLPYRIDINIUM CHLORIDE 0.05 % MT LIQD
7.0000 mL | Freq: Two times a day (BID) | OROMUCOSAL | Status: DC
  Administered 2015-07-19 – 2015-07-25 (×11): 7 mL via OROMUCOSAL

## 2015-07-19 MED ORDER — POTASSIUM CHLORIDE 20 MEQ/15ML (10%) PO SOLN
30.0000 meq | ORAL | Status: AC
  Administered 2015-07-19 (×2): 30 meq
  Filled 2015-07-19 (×2): qty 30

## 2015-07-19 NOTE — Progress Notes (Signed)
PULMONARY / CRITICAL CARE MEDICINE   Name: Shannon Garrett MRN: [id number] DOB: 1960-12-16    ADMISSION DATE:  07/10/2015 CONSULTATION DATE:  07/10/2015  REFERRING MD:  Stark Jock  CHIEF COMPLAINT:  Acute encephalopathy  HISTORY OF PRESENT ILLNESS:   55 y/o female with a history of MS and multiple admissions for foley catheter associated UTI was admitted from [facility] hospital on 07/09/2014 after being brought in by EMS from her SNF.  She was just discharged from [facility] on 1/6 after being treated for a UTI from acinetobacter.  She was discharged on Cipro.  Today she was found nearly unresponsive in her SNF.  No family at bedside, no one available to provide history.    SUBJECTIVE:  Awake and following commands Doing well on weaning trial. lots of secretions.   VITAL SIGNS: BP 144/77 mmHg  Pulse 114  Temp(Src) 100.7 F (38.2 C) (Axillary)  Resp 19  Ht 5\' 2"  (1.575 m)  Wt 207 lb 3.7 oz (94 kg)  BMI 37.89 kg/m2  SpO2 100%  HEMODYNAMICS:    VENTILATOR SETTINGS: Vent Mode:  [-] PSV;CPAP FiO2 (%):  [40 %] 40 % Set Rate:  [14 bmp] 14 bmp Vt Set:  [450 mL] 450 mL PEEP:  [5 cmH20] 5 cmH20 Pressure Support:  [5 cmH20-8 cmH20] 5 cmH20 Plateau Pressure:  [15 cmH20-20 cmH20] 16 cmH20  INTAKE / OUTPUT: I/O last 3 completed shifts: In: 5410 [I.V.:2550; NG/GT:2710; IV Piggyback:150] Out: [id number] [Urine:3545]  PHYSICAL EXAMINATION: General:  Obese, on vent Neuro:  Eyes open, tracks and turns to voice, follows commands, weak cough HEENT:  Moist mucus membranes. ETT in place. Cardiovascular:  RRR, no MRG Lungs:  CTA B Abdomen:  BS+, soft, non tender  Musculoskeletal:  Diminished tone but sedated Skin:  Edema all over  LABS:  BMET  Recent Labs Lab 07/17/15 0520 07/18/15 0457 07/19/15 0454  NA 146* 143 140  K 3.2* 3.4* 3.2*  CL 112* 110 106  CO2 28 27 27   BUN 19 18 15   CREATININE 0.62 0.58 0.56  GLUCOSE 161* 152* 145*    Electrolytes  Recent Labs Lab 07/13/15 0330   07/17/15 0520 07/18/15 0457 07/19/15 0454  CALCIUM 8.2*  < > 8.8* 8.6* 8.7*  MG 1.9  --  1.8  --  1.8  PHOS 2.6  --  2.5  --  2.5  < > = values in this interval not displayed.  CBC  Recent Labs Lab 07/16/15 0500 07/17/15 0520 07/19/15 0454  WBC 9.5 11.8* 11.4*  HGB 9.5* 9.3* 8.7*  HCT 29.0* 27.9* 25.1*  PLT 207 194 175    Coag's No results for input(s): APTT, INR in the last 168 hours.  Sepsis Markers No results for input(s): LATICACIDVEN, PROCALCITON, O2SATVEN in the last 168 hours.  ABG No results for input(s): PHART, PCO2ART, PO2ART in the last 168 hours.  Liver Enzymes No results for input(s): AST, ALT, ALKPHOS, BILITOT, ALBUMIN in the last 168 hours.  Cardiac Enzymes No results for input(s): TROPONINI, PROBNP in the last 168 hours.  Glucose  Recent Labs Lab 07/18/15 1210 07/18/15 1614 07/18/15 2027 07/18/15 2359 07/19/15 0328 07/19/15 0845  GLUCAP 115* 141* 116* 118* 154* 132*    Imaging Dg Chest Port 1 View  07/19/2015  CLINICAL DATA:  Respiratory failure EXAM: PORTABLE CHEST 1 VIEW COMPARISON:  07/16/2015 FINDINGS: Cardiomediastinal silhouette is stable. Endotracheal tube and NG tube are unchanged in position. Right arm PICC line is stable in position. Persistent nodular sclerotic focus  right fourth rib anteriorly. No acute infiltrate or pulmonary edema. Minimal left basilar atelectasis. IMPRESSION: Stable support apparatus. Minimal left basilar atelectasis. No infiltrate or pulmonary edema. Electronically Signed   By: Lahoma Crocker M.D.   On: 07/19/2015 08:17     STUDIES:  1/8 CT head > no acute changes 1/9 MRI Brain >> chronic MS changes but nothing acute CXR 1/17>> Reviewed. No acute infiltrate.  CULTURES: 1/8 Blood X2 > 1 of 2 coag neg staph >>  CSF 1/9 >> normal Urine 1/10 >> negative  ANTIBIOTICS: Vanc 1/8 > 1/10 Ceftriaxone x1 1/8 Ceftaz 1/8 > 1/10 Acyclovir 1/8 > 1/10 Zosyn 1/10 >> 1/14  SIGNIFICANT EVENTS:  LINES/TUBES: 1/8 ETT  >   DISCUSSION: 55 y.o female with multiple sclerosis who lives in a SNF and has recurrent UTI's was admitted from the [facility] ER with acute encephalopathy of uncertain etiology. Improved significantly by 1/13-1/14  ASSESSMENT / PLAN:  PULMONARY A: Acute respiratory failure with hypoxemia P:   Full vent support vap bundle Encephalopathy much improved.  Her weakness due to multiple sclerosis will complicate weaning and extubation.  Doing well on the weaning trial today. Will attempt extubation. If she fails then we will need to discuss trach with the family.  CARDIOVASCULAR A:  HTN P:  Tele Increased coreg to 25 bid on 1/14 Added home spironolactone 1/10  RENAL A:   Hypernatremia Hypokalemia AKI uncertain etiology, improving P:   Renal dose meds IVF Monitor BMET and UOP Replace electrolytes as needed Free water > decreased on 1/15   GASTROINTESTINAL A:   No acute issues P:   OG tube pepcid Tube feedings  HEMATOLOGIC A:   No acute issues P:  Monitor for bleeding dvt prophylaxis  INFECTIOUS A:   Recurrent UTI, pyuria on admission, recent acetobacter Coag neg staph 1 of 2, likely contaminant P:   D/c vanco + acyclovir on 1/10 D/c ceftaz 1/10 given potential for neuro effects Completed zosyn 1/10 - 1/14 for presumed residual acinetobacter UTI; does not have the neuro effects that can be seen w cephalosporins.    ENDOCRINE A:   Possible adrenal insuff s/p recent infection P:   Monitor glucose Hydrocortisone. Will start weaning to 50 mg q12.  NEUROLOGIC A:   Acute encephalopathy, LP, MRI and EEG non-diagnostic. Consider UTI / sepsis, effects of meds including cephalosporin encephalopathy. Also considering adrenal insufficiency following recent infxn, treated w steroids ? Seizures > EEG unclear P:   Off dilantin as per neuro. Hold most home meds Changed abx to zosyn on 1/10, completed Empiric hydrocort added 1/11 Keep baclofen > decreased dose RASS  goal: 0 Intermittent fentanyl    FAMILY  - Updates: Updated at bedside 1/9. No family at bedside 1/16 and no answer on phone.  - Inter-disciplinary family meet or Palliative Care meeting due by:  07/16/14  Critical care time time 35 minutes.  Marshell Garfinkel MD [facility] Pager [phone] If no answer or after 3pm call: [phone] 07/19/2015, 9:17 AM

## 2015-07-19 NOTE — Procedures (Signed)
Extubation Procedure Note  Patient Details:   Name: Leialoha Cease DOB: 04/20/1961 MRN: [id number]   Airway Documentation:     Evaluation  O2 sats: stable throughout Complications: No apparent complications Patient did tolerate procedure well. Bilateral Breath Sounds: Rhonchi Suctioning: Airway Yes   Pt. Was extubated to a 3L [place] with RN at the bedside without any complications, dyspnea or stridor noted.   Maylea Soria, Eddie [place] 07/19/2015, 9:40 AM

## 2015-07-20 ENCOUNTER — Inpatient Hospital Stay (HOSPITAL_COMMUNITY): Payer: Medicare Other

## 2015-07-20 LAB — GLUCOSE, CAPILLARY
GLUCOSE-CAPILLARY: 102 mg/dL — AB (ref 65–99)
Glucose-Capillary: 109 mg/dL — ABNORMAL HIGH (ref 65–99)
Glucose-Capillary: 92 mg/dL (ref 65–99)
Glucose-Capillary: 92 mg/dL (ref 65–99)
Glucose-Capillary: 118 mg/dL — ABNORMAL HIGH (ref 65–99)

## 2015-07-20 LAB — CBC
HCT: 24.4 % — ABNORMAL LOW (ref 36.0–46.0)
HEMOGLOBIN: 8.2 g/dL — AB (ref 12.0–15.0)
MCH: 27.2 pg (ref 26.0–34.0)
MCHC: 33.6 g/dL (ref 30.0–36.0)
MCV: 81.1 fL (ref 78.0–100.0)
PLATELETS: 187 10*3/uL (ref 150–400)
RBC: 3.01 MIL/uL — AB (ref 3.87–5.11)
RDW: 16.6 % — ABNORMAL HIGH (ref 11.5–15.5)
WBC: 10.4 10*3/uL (ref 4.0–10.5)

## 2015-07-20 LAB — BASIC METABOLIC PANEL
ANION GAP: 8 (ref 5–15)
BUN: 7 mg/dL (ref 6–20)
CO2: 25 mmol/L (ref 22–32)
Calcium: 8.6 mg/dL — ABNORMAL LOW (ref 8.9–10.3)
Chloride: 108 mmol/L (ref 101–111)
Creatinine, Ser: 0.54 mg/dL (ref 0.44–1.00)
GFR calc Af Amer: >60 mL/min (ref >60)
Glucose, Bld: 107 mg/dL — ABNORMAL HIGH (ref 65–99)
POTASSIUM: 3.1 mmol/L — AB (ref 3.5–5.1)
SODIUM: 141 mmol/L (ref 135–145)

## 2015-07-20 LAB — PHOSPHORUS: Phosphorus: 2.5 mg/dL (ref 2.5–4.6)

## 2015-07-20 LAB — MAGNESIUM: MAGNESIUM: 1.8 mg/dL (ref 1.7–2.4)

## 2015-07-20 MED ORDER — POTASSIUM CHLORIDE 10 MEQ/50ML IV SOLN
10.0000 meq | INTRAVENOUS | Status: AC
  Administered 2015-07-20 (×4): 10 meq via INTRAVENOUS
  Filled 2015-07-20 (×3): qty 50

## 2015-07-20 NOTE — Progress Notes (Signed)
[facility] ICU Electrolyte Replacement Protocol  Patient Name: Shannon Garrett DOB: [birth year] MRN: [id number]  Date of Service  07/20/2015   HPI/Events of Note    Recent Labs Lab 07/16/15 0500 07/17/15 0520 07/18/15 0457 07/19/15 0454 07/20/15 0503  NA 156* 146* 143 140 141  K 3.2* 3.2* 3.4* 3.2* 3.1*  CL 118* 112* 110 106 108  CO2 _0 GLUCOSE 171* 161* 152* 145* 107*  BUN 24* _1 CREATININE 0.80 0.62 0.58 0.56 0.54  CALCIUM 9.2 8.8* 8.6* 8.7* 8.6*  MG  --  1.8  --  1.8 1.8  PHOS  --  2.5  --  2.5 2.5    Estimated Creatinine Clearance: 87.5 mL/min (by C-G formula based on Cr of 0.54).  Intake/Output      01/17 0701 - 01/18 0700   I.V. (mL/kg) 1725 (18.5)   NG/GT 100   IV Piggyback 100   Total Intake(mL/kg) 1925 (20.6)   Urine (mL/kg/hr) 2600 (1.2)   Total Output 2600   Net -675        - I/O DETAILED x24h    Total I/O In: 875 [I.V.:825; IV Piggyback:50] Out: 1175 [Urine:1175] - I/O THIS SHIFT    ASSESSMENT   eICURN Interventions   ICU Electrolyte Replacement Protocol criteria met. Labs Replaced per protocol. MD notified   ASSESSMENT: MAJOR ELECTROLYTE    Lorene Dy 07/20/2015, 6:15 AM

## 2015-07-20 NOTE — Progress Notes (Signed)
Pt arrived to [place] at 2035. All VS's WNL, pt is alert/Oriented to self but confused otherwise. RN will continue to monitor.

## 2015-07-20 NOTE — Progress Notes (Addendum)
PULMONARY / CRITICAL CARE MEDICINE   Name: Shannon Garrett MRN: [id number] DOB: 09-Aug-1960    ADMISSION DATE:  07/10/2015 CONSULTATION DATE:  07/10/2015  REFERRING MD:  Stark Jock  CHIEF COMPLAINT:  Acute encephalopathy  HISTORY OF PRESENT ILLNESS:   55 y/o female with a history of MS and multiple admissions for foley catheter associated UTI was admitted from [facility] hospital on 07/09/2014 after being brought in by EMS from her SNF.  She was just discharged from [facility] on 1/6 after being treated for a UTI from acinetobacter.  She was discharged on Cipro.  Today she was found nearly unresponsive in her SNF.  No family at bedside, no one available to provide history.    SUBJECTIVE:  Extubated yesterday, MS still poor.  VITAL SIGNS: BP 121/73 mmHg  Pulse 112  Temp(Src) 99.4 F (37.4 C) (Oral)  Resp 24  Ht 5\' 2"  (1.575 m)  Wt 205 lb 14.6 oz (93.4 kg)  BMI 37.65 kg/m2  SpO2 100%  HEMODYNAMICS:    VENTILATOR SETTINGS:    INTAKE / OUTPUT: I/O last 3 completed shifts: In: 3500 [I.V.:2550; NG/GT:750; IV Piggyback:200] Out: [id number] [Urine:4025]  PHYSICAL EXAMINATION: General:  Obese, No distress Neuro:  Follows commands, Moves all 4 extremities HEENT:  Moist mucus membranes. E Cardiovascular:  RRR, no MRG Lungs:  CTA B Abdomen:  BS+, soft, non tender  Musculoskeletal:  Diminished tone but sedated Skin:  1+ Edema.  LABS:  BMET  Recent Labs Lab 07/18/15 0457 07/19/15 0454 07/20/15 0503  NA 143 140 141  K 3.4* 3.2* 3.1*  CL 110 106 108  CO2 27 27 25   BUN 18 15 7   CREATININE 0.58 0.56 0.54  GLUCOSE 152* 145* 107*    Electrolytes  Recent Labs Lab 07/17/15 0520 07/18/15 0457 07/19/15 0454 07/20/15 0503  CALCIUM 8.8* 8.6* 8.7* 8.6*  MG 1.8  --  1.8 1.8  PHOS 2.5  --  2.5 2.5    CBC  Recent Labs Lab 07/17/15 0520 07/19/15 0454 07/20/15 0503  WBC 11.8* 11.4* 10.4  HGB 9.3* 8.7* 8.2*  HCT 27.9* 25.1* 24.4*  PLT 194 175 187    Coag's No results for  input(s): APTT, INR in the last 168 hours.  Sepsis Markers No results for input(s): LATICACIDVEN, PROCALCITON, O2SATVEN in the last 168 hours.  ABG No results for input(s): PHART, PCO2ART, PO2ART in the last 168 hours.  Liver Enzymes No results for input(s): AST, ALT, ALKPHOS, BILITOT, ALBUMIN in the last 168 hours.  Cardiac Enzymes No results for input(s): TROPONINI, PROBNP in the last 168 hours.  Glucose  Recent Labs Lab 07/19/15 1236 07/19/15 1615 07/19/15 1919 07/19/15 2328 07/20/15 0344 07/20/15 0741  GLUCAP 102* 91 113* 91 102* 109*    Imaging Dg Chest Port 1 View  07/20/2015  CLINICAL DATA:  Follow-up respiratory failure EXAM: PORTABLE CHEST 1 VIEW COMPARISON:  07/19/2015 FINDINGS: Interval extubation and removal of NG tube. Right PICC line remains in place, unchanged. Mild cardiomegaly. Decreasing lung volumes with increasing bibasilar opacities, likely atelectasis. No visible significant effusions or acute bony abnormality. IMPRESSION: Interval extubation. Decreasing lung volumes with increasing bibasilar opacities, likely atelectasis. Electronically Signed   By: Rolm Baptise M.D.   On: 07/20/2015 08:17     STUDIES:  1/8 CT head > no acute changes 1/9 MRI Brain >> chronic MS changes but nothing acute CXR 1/17>> Reviewed. No acute infiltrate.  CULTURES: 1/8 Blood X2 > 1 of 2 coag neg staph >>  CSF 1/9 >>  normal Urine 1/10 >> negative  ANTIBIOTICS: Vanc 1/8 > 1/10 Ceftriaxone x1 1/8 Ceftaz 1/8 > 1/10 Acyclovir 1/8 > 1/10 Zosyn 1/10 >> 1/14  SIGNIFICANT EVENTS:  LINES/TUBES: 1/8 ETT >   DISCUSSION: 55 y.o female with multiple sclerosis who lives in a SNF and has recurrent UTI's was admitted from the [facility] ER with acute encephalopathy of uncertain etiology.  ASSESSMENT / PLAN:  PULMONARY A: Acute respiratory failure with hypoxemia P:   Extubated. Concern about ability to protect airway given the encephalopahty and weakness.  Will wean down O2 as  tolerated. Incentive spirometer Get OOB, mobilize, PT  CARDIOVASCULAR A:  HTN P:  Tele On Coreg  25 bid  spironolactone  RENAL A:   Hypernatremia Hypokalemia AKI uncertain etiology, improving P:   Renal dose meds IVF Monitor BMET and UOP Replace electrolytes as needed Free water- Held as she does not have a feeding tube.   GASTROINTESTINAL A:   No acute issues P:   Swallow eval today. Keep NPO  HEMATOLOGIC A:   No acute issues P:  Monitor for bleeding dvt prophylaxis  INFECTIOUS A:   Recurrent UTI, pyuria on admission, recent acetobacter Coag neg staph 1 of 2, likely contaminant P:   D/c vanco + acyclovir on 1/10 D/c ceftaz 1/10 given potential for neuro effects Completed zosyn 1/10 - 1/14 for presumed residual acinetobacter UTI; does not have the neuro effects that can be seen w cephalosporins.    ENDOCRINE A:   Possible adrenal insuff s/p recent infection P:   Monitor glucose Hydrocortisone. Wean to 25 q12.  NEUROLOGIC A:   Acute encephalopathy, LP, MRI and EEG non-diagnostic. Consider UTI / sepsis, effects of meds including cephalosporin encephalopathy. Also considering adrenal insufficiency following recent infxn, treated w steroids ? Seizures > EEG unclear P:   Off dilantin as per neuro. Hold most home meds Changed abx to zosyn on 1/10, completed Empiric hydrocort added 1/11 Keep baclofen > decreased dose Intermittent fentanyl    FAMILY  - Updates: Updated at bedside 1/9. No family at bedside 1/17 and no answer on phone.  - Inter-disciplinary family meet or Palliative Care meeting due by:  07/16/14  Critical care time time 35 minutes.  Marshell Garfinkel MD [place] [facility] Pager [phone] If no answer or after 3pm call: [phone] 07/20/2015, 10:03 AM

## 2015-07-20 NOTE — Evaluation (Signed)
Clinical/Bedside Swallow Evaluation Patient Details  Name: Shannon Garrett MRN: [id number] Date of Birth: 11-13-1960  Today's Date: 07/20/2015 Time: SLP Start Time (ACUTE ONLY): 26 SLP Stop Time (ACUTE ONLY): 1050 SLP Time Calculation (min) (ACUTE ONLY): 14 min  Past Medical History:  Past Medical History  Diagnosis Date  . MS (multiple sclerosis) ([place])     a. Dx'd late 20's. b. Tx with Novantrone, Tysabri, Copaxone previously.  Marland Kitchen Hypertension   . Depression   . Glaucoma   . Cardiomyopathy ([place])     a.  Echo 04/29/12: Mild LVH, EF 20-25%, mild AI, moderate MR, moderate LAE, mild RAE, mild RVE, moderate TR, PASP 51, small pericardial effusion;   b. probably non-ischemic given multiple chemo-Tx agents used for MS and global LV dysfn on echo  . Chronic systolic heart failure [facility])    Past Surgical History:  Past Surgical History  Procedure Laterality Date  . Ablation      uterine  . Cesarean section     HPI:  55 y/o female with a history of MS and multiple admissions for foley catheter associated UTI was admitted from [facility] hospital on 07/09/2014 after being brought in by EMS from her SNF. She was just discharged from [facility] on 1/6 after being treated for a UTI from acinetobacter. She was discharged on Cipro. She was found nearly unresponsive in her SNF. Intubated from 1/8 to 1/17. Acute encephalopathy of uncertain etiology. Seen on 06/03/15, found to have adequate ariway protection, mild oral dysphagia due to AMS. Recommended to consume regualr thin. Prior visits have observed the same. CXR clear on admission.    Assessment / Plan / Recommendation Clinical Impression  Pt does not show any overt signs of aspiration with PO trials. Voice is clear with cues to increase volume. Mastication is quite slow and pts awareness of POs is impaired. Cough is also weak and pt has a congested cough at baseline. Given these findings, recomend pt intiate a dys 2 (fine chip) diet with thin  liquids with close supervision from RN. Meds whole in puree. Will follow for tolerance.     Aspiration Risk  Mild aspiration risk    Diet Recommendation Dysphagia 2 (Fine chop);Thin liquid   Liquid Administration via: Cup;Straw Medication Administration: Whole meds with puree Supervision: Staff to assist with self feeding Compensations: Minimize environmental distractions;Slow rate;Small sips/bites Postural Changes: Seated upright at 90 degrees    Other  Recommendations Oral Care Recommendations: Oral care BID   Follow up Recommendations  Skilled Nursing facility    Frequency and Duration min 2x/week  1 week       Prognosis Prognosis for Safe Diet Advancement: Fair      Swallow Study   General HPI: 54 y/o female with a history of MS and multiple admissions for foley catheter associated UTI was admitted from [facility] hospital on 07/09/2014 after being brought in by EMS from her SNF. She was just discharged from [facility] on 1/6 after being treated for a UTI from acinetobacter. She was discharged on Cipro. She was found nearly unresponsive in her SNF. Intubated from 1/8 to 1/17. Acute encephalopathy of uncertain etiology. Seen on 06/03/15, found to have adequate ariway protection, mild oral dysphagia due to AMS. Recommended to consume regualr thin. Prior visits have observed the same. CXR clear on admission.  Type of Study: Bedside Swallow Evaluation Previous Swallow Assessment: see HPI Diet Prior to this Study: NPO Temperature Spikes Noted: No Respiratory Status: Nasal cannula History of  Recent Intubation: Yes Length of Intubations (days): 10 days Date extubated: 07/19/15 Behavior/Cognition: Confused Oral Cavity Assessment: Within Functional Limits Oral Care Completed by SLP: No Oral Cavity - Dentition: Adequate natural dentition Vision: Impaired for self-feeding Self-Feeding Abilities: Needs assist Patient Positioning: Upright in bed Baseline Vocal Quality: Low vocal  intensity Volitional Cough: Congested;Weak Volitional Swallow: Unable to elicit    Oral/Motor/Sensory Function Overall Oral Motor/Sensory Function: Within functional limits   Ice Chips     Thin Liquid Thin Liquid: Within functional limits Presentation: Cup;Straw    Nectar Thick Nectar Thick Liquid: Not tested   Honey Thick Honey Thick Liquid: Not tested   Puree Puree: Within functional limits   Solid   GO   Solid: Impaired Oral Phase Impairments: Impaired mastication Oral Phase Functional Implications: Impaired mastication;Prolonged oral transit (slow)       Herbie Baltimore, MA CCC-SLP [phone]  Keidrick Murty, Katherene Ponto 07/20/2015,11:21 AM

## 2015-07-21 DIAGNOSIS — R1310 Dysphagia, unspecified: Secondary | ICD-10-CM

## 2015-07-21 DIAGNOSIS — R531 Weakness: Secondary | ICD-10-CM

## 2015-07-21 LAB — CBC
HCT: 23 % — ABNORMAL LOW (ref 36.0–46.0)
HEMOGLOBIN: 7.8 g/dL — AB (ref 12.0–15.0)
MCH: 26.5 pg (ref 26.0–34.0)
MCHC: 33.9 g/dL (ref 30.0–36.0)
MCV: 78.2 fL (ref 78.0–100.0)
Platelets: 219 10*3/uL (ref 150–400)
RBC: 2.94 MIL/uL — ABNORMAL LOW (ref 3.87–5.11)
RDW: 16.4 % — AB (ref 11.5–15.5)
WBC: 9.4 10*3/uL (ref 4.0–10.5)

## 2015-07-21 LAB — BASIC METABOLIC PANEL
Anion gap: 7 (ref 5–15)
CALCIUM: 8.4 mg/dL — AB (ref 8.9–10.3)
CHLORIDE: 105 mmol/L (ref 101–111)
CO2: 23 mmol/L (ref 22–32)
CREATININE: 0.59 mg/dL (ref 0.44–1.00)
GFR calc non Af Amer: >60 mL/min (ref >60)
Glucose, Bld: 296 mg/dL — ABNORMAL HIGH (ref 65–99)
Potassium: 3 mmol/L — ABNORMAL LOW (ref 3.5–5.1)
SODIUM: 135 mmol/L (ref 135–145)

## 2015-07-21 LAB — GLUCOSE, CAPILLARY
GLUCOSE-CAPILLARY: 122 mg/dL — AB (ref 65–99)
GLUCOSE-CAPILLARY: 126 mg/dL — AB (ref 65–99)
GLUCOSE-CAPILLARY: 132 mg/dL — AB (ref 65–99)
Glucose-Capillary: 118 mg/dL — ABNORMAL HIGH (ref 65–99)
Glucose-Capillary: 95 mg/dL (ref 65–99)

## 2015-07-21 LAB — MAGNESIUM: MAGNESIUM: 1.7 mg/dL (ref 1.7–2.4)

## 2015-07-21 LAB — PHOSPHORUS: Phosphorus: 2.6 mg/dL (ref 2.5–4.6)

## 2015-07-21 MED ORDER — POTASSIUM CHLORIDE CRYS ER 20 MEQ PO TBCR
40.0000 meq | EXTENDED_RELEASE_TABLET | Freq: Three times a day (TID) | ORAL | Status: AC
  Administered 2015-07-21 (×2): 40 meq via ORAL
  Filled 2015-07-21 (×2): qty 2

## 2015-07-21 MED ORDER — FUROSEMIDE 10 MG/ML IJ SOLN
20.0000 mg | Freq: Three times a day (TID) | INTRAMUSCULAR | Status: AC
  Administered 2015-07-21 (×2): 20 mg via INTRAVENOUS
  Filled 2015-07-21 (×2): qty 2

## 2015-07-21 MED ORDER — FAMOTIDINE 20 MG PO TABS
20.0000 mg | ORAL_TABLET | Freq: Two times a day (BID) | ORAL | Status: DC
  Administered 2015-07-21 – 2015-07-25 (×9): 20 mg via ORAL
  Filled 2015-07-21 (×9): qty 1

## 2015-07-21 MED ORDER — ENSURE ENLIVE PO LIQD
237.0000 mL | Freq: Two times a day (BID) | ORAL | Status: DC
  Administered 2015-07-22 – 2015-07-25 (×8): 237 mL via ORAL
  Filled 2015-07-21 (×6): qty 237

## 2015-07-21 NOTE — Progress Notes (Signed)
Patient transferred from [facility] to [facility]. CSW provided handoff. Per [place], patient is able to return at discharge.   Percell Locus Ciel Yanes LCSWA [phone]

## 2015-07-21 NOTE — Progress Notes (Signed)
Case management obtained DNR form.  Patient is agreeable.  Will make a full DNR at this point.  Rush Farmer, M.D. [facility] Pulmonary/Critical Care Medicine. Pager: [phone]. After hours pager: [phone].

## 2015-07-21 NOTE — Clinical Social Work Note (Signed)
Clinical Social Work Assessment  Patient Details  Name: Shannon Garrett MRN: [id number] Date of Birth: 10-Dec-1960  Date of referral:  07/21/15               Reason for consult:  Facility Placement                Permission sought to share information with:  Family Supports, Chartered certified accountant granted to share information::  Yes, Verbal Permission Granted  Name::     Advertising copywriter::  [facility]  Relationship::  sister  Contact Information:     Housing/Transportation Living arrangements for the past 2 months:  [place] of Information:  Other (Comment Required) (sibling) Patient Interpreter Needed:  None Criminal Activity/Legal Involvement Pertinent to Current Situation/Hospitalization:  No - Comment as needed Significant Relationships:  Siblings Lives with:  Facility Resident Do you feel safe going back to the place where you live?  Yes Need for family participation in patient care:  Yes (Comment)  Care giving concerns: none pt is long term care resident at [facility] [place] Worker assessment / plan:  CSW spoke with pt sister, Shannon Garrett, concerning return to [facility] when medically stable.  CSW explained CSW role in coordinating return to facility  Employment status:  Retired Nurse, adult, Medicaid In [place] PT Recommendations:  Not assessed at this time Information / Referral to community resources:  [place]  Patient/Family's Response to care:  Pt sister is agreeable to pt return to [facility]- no concerns with care there at this time.  Patient/Family's Understanding of and Emotional Response to Diagnosis, Current Treatment, and Prognosis: no questions or concerns at this time.  Emotional Assessment Appearance:  Appears stated age Attitude/Demeanor/Rapport:  Unable to Assess Affect (typically observed):  Unable to Assess Orientation:   Oriented to Self, Oriented to Place, Oriented to  Time Alcohol / Substance use:  Not Applicable Psych involvement (Current and /or in the community):  No (Comment)  Discharge Needs  Concerns to be addressed:  Care Coordination Readmission within the last 30 days:  Yes Current discharge risk:  None Barriers to Discharge:  Continued Medical Work up   [employer], LCSW 07/21/2015, 4:22 PM

## 2015-07-21 NOTE — Progress Notes (Signed)
Nutrition Follow Up  DOCUMENTATION CODES:   Obesity unspecified  INTERVENTION:   Ensure Enlive po BID, each supplement provides 350 kcal and 20 grams of protein  NUTRITION DIAGNOSIS:   Inadequate oral intake now related to dysphagia as evidenced by meal completion < 25% (bedside swallow evaluation), ongoing  GOAL:   Patient will meet greater than or equal to 90% of their needs, currently unmet  MONITOR:   PO intake, Supplement acceptance, Labs, Weight trends, I & O's  ASSESSMENT:   55 y/o Female with a history of MS and multiple admissions for foley catheter associated UTI was admitted from [facility] hospital on 07/09/2014 after being brought in by EMS from her SNF. She was just discharged from [facility] on 1/6 after being treated for a UTI from acinetobacter. She was discharged on Cipro. Today she was found nearly unresponsive in her SNF. No family at bedside, no one available to provide history.   Patient s/p procedure 1/9: DG FLUORO FOR LUMBAR PUNCTURE   EEG showed moderate - severe generalized cerebral dysfunction (encephalopathy).  Patient extubated 1/17.  S/p bedside swallow evaluation 1/18.  Diet advanced to Dys 2, thin liquid.  PO intake very poor at 10% per flowsheet records.  Will add oral nutrition supplements to help optimize kcal, protein intake.    Diet Order:  DIET DYS 2 Room service appropriate?: Yes; Fluid consistency:: Thin  Skin:  Wound (see comment) (MSAD to bilateral buttocks)  Last BM:  1/17  Height:   Ht Readings from Last 1 Encounters:  07/10/15 5\' 2"  (1.575 m)    Weight:   Wt Readings from Last 1 Encounters:  07/21/15 217 lb 1.6 oz (98.476 kg)    Ideal Body Weight:  50 kg  BMI:  Body mass index is 39.7 kg/(m^2).  Estimated Nutritional Needs:   Kcal:  [id number]  Protein:  100-110 gm  Fluid:  1.7-1.9 L  EDUCATION NEEDS:   No education needs identified at this time  Arthur Holms, RD, LDN Pager #: [phone] After-Hours  Pager #: [phone]

## 2015-07-21 NOTE — Evaluation (Signed)
Physical Therapy Evaluation Patient Details Name: [name] MRN: [id number] DOB: [birth year] Today's Date: 07/21/2015   History of Present Illness  Shannon Garrett is a 55 y.o. female with a past medical history of advanced multiple sclerosis, hypertension, depression, glaucoma, systolic CHF, chronic pain syndrome who is brought to [facility] hospital from [place] due to acute encephalopathy and VDRF 1/8-1/17/17. . At baseline, Shannon patient is usually nonambulatory due to lower extremity weakness (mechanical lifts are used to get pt into WC daily, she's not able to propel WC due to LUE weakness), but awake alert oriented 3 and able to talk and interact with [facility] facilities personnel and relatives.   Clinical Impression  Pt admitted with above diagnosis. Pt currently with functional limitations due to Shannon deficits listed below (see PT Problem List). Pt was able to sit EOB but needed total to min assist for seconds at a time.  Will follow to maintain pts function that she had at SNF.  She was able to use Clarise Cruz plus for stand pivot transfers at [facility].  Will follow acutely.  Pt will benefit from skilled PT to increase their independence and safety with mobility to allow discharge to [facility] venue listed below.      Follow Up Recommendations SNF;Supervision/Assistance - 24 hour    Equipment Recommendations  None recommended by PT    Recommendations for Other Services       Precautions / Restrictions Precautions Precautions: Fall Restrictions Weight Bearing Restrictions: No      Mobility  Bed Mobility Overal bed mobility: +2 for physical assistance;Needs Assistance Bed Mobility: Supine to Sit     Supine to sit: +2 for physical assistance;Total assist     General bed mobility comments: pt 5%; assist for trunk and BLEs  Transfers                    Ambulation/Gait                Stairs            Wheelchair Mobility    Modified Rankin (Stroke  Patients Only)       Balance Overall balance assessment: Needs assistance Sitting-balance support: Bilateral upper extremity supported;Feet supported Sitting balance-Leahy Scale: Zero Sitting balance - Comments: pt sat on EOB x 29min, at times required max assist due to posterior LOB, at times was able to sit with min assist for only seconds; performed lateral and ant/posterior weight shifts in sitting and reaching forward over BOS with RUE                                     Pertinent Vitals/Pain Pain Assessment: No/denies pain  VSS    Home Living Family/patient expects to be discharged to:: Skilled nursing facility                      Prior Function Level of Independence: Needs assistance   Gait / Transfers Assistance Needed: Using stand up lift with 2 person assist for transfers into w/c.  Unable to propel w/c due to LUE weakness.  ADL's / Homemaking Assistance Needed: aide - total assist  Comments: Pt reports her w/c has lateral supports on R side due to trunk weakness.     Hand Dominance   Dominant Hand: Right    Extremity/Trunk Assessment   Upper Extremity Assessment: RUE deficits/detail;LUE deficits/detail RUE Deficits / Details: shoulder  elevation AAROM 90*, elbow +3/5, grip -4/5     LUE Deficits / Details: shoulder elevation AAROM 50*, elbow +2/5, grip 3/5   Lower Extremity Assessment: RLE deficits/detail;LLE deficits/detail RLE Deficits / Details: ankle DF PROM 0*, quad set 2-/5, hip ABD/flexion 2-/5 LLE Deficits / Details: foot drop, ankle DF PROM -10*, no AROM in ankle, 0/5 quad set, hip ABD/flexion 0/5  Cervical / Trunk Assessment: Kyphotic (poor trunk control sitting on EOB)  Communication   Communication: Expressive difficulties (low volume)  Cognition Arousal/Alertness: Awake/alert Behavior During Therapy: WFL for tasks assessed/performed Overall Cognitive Status: Within Functional Limits for tasks assessed                       General Comments      Exercises General Exercises - Lower Extremity Ankle Circles/Pumps: PROM;Both;10 reps;Supine Long Arc Quad: AROM;Right;5 reps;Seated      Assessment/Plan    PT Assessment Patient needs continued PT services  PT Diagnosis Generalized weakness   PT Problem List Decreased strength;Decreased range of motion;Decreased activity tolerance;Decreased balance;Decreased mobility  PT Treatment Interventions Functional mobility training;Therapeutic activities;Therapeutic exercise   PT Goals (Current goals can be found in Shannon Care Plan section) Acute Rehab PT Goals Patient Stated Goal: to get better PT Goal Formulation: With patient Time For Goal Achievement: 08/04/15 Potential to Achieve Goals: Fair    Frequency Min 2X/week   Barriers to discharge Decreased caregiver support      Co-evaluation               End of Session   Activity Tolerance: Patient limited by fatigue Patient left: in bed;with call bell/phone within reach;with bed alarm set Nurse Communication: Mobility status;Need for lift equipment         Time: [place]:[id number] PT Time Calculation (min) (ACUTE ONLY): 23 min   Charges:   PT Evaluation $PT Eval Moderate Complexity: 1 Procedure PT Treatments $Therapeutic Activity: 8-22 mins   PT G Codes:        Giulian Goldring F 08-14-2015, 1:32 PM Arun Herrod,PT Acute Rehabilitation [phone] (pager)

## 2015-07-21 NOTE — Progress Notes (Signed)
PULMONARY / CRITICAL CARE MEDICINE   Name: Shannon Garrett MRN: [id number] DOB: April 06, 1961    ADMISSION DATE:  07/10/2015 CONSULTATION DATE:  07/10/2015  REFERRING MD:  Stark Jock  CHIEF COMPLAINT:  Acute encephalopathy  HISTORY OF PRESENT ILLNESS:   55 y/o female with a history of MS and multiple admissions for foley catheter associated UTI was admitted from [facility] hospital on 07/09/2014 after being brought in by EMS from her SNF.  She was just discharged from [facility] on 1/6 after being treated for a UTI from acinetobacter.  She was discharged on Cipro.  Today she was found nearly unresponsive in her SNF.  No family at bedside, no one available to provide history.    SUBJECTIVE:  Extubated yesterday, MS still poor.  VITAL SIGNS: BP 104/60 mmHg  Pulse 98  Temp(Src) 98.8 F (37.1 C) (Oral)  Resp 29  Ht 5\' 2"  (1.575 m)  Wt 98.476 kg (217 lb 1.6 oz)  BMI 39.70 kg/m2  SpO2 100%  HEMODYNAMICS:    VENTILATOR SETTINGS:    INTAKE / OUTPUT: I/O last 3 completed shifts: In: 2925 [I.V.:2625; IV Piggyback:300] Out: 3025 [Urine:3025]  PHYSICAL EXAMINATION: General:  Obese, No distress Neuro:  Follows commands, Moves all 4 extremities but slowly and weakly. HEENT:  [place]/AT, PERRL, EOM-I and Moist mucus membranes. Cardiovascular:  RRR, no MRG and Nl S1/S2. Lungs:  Bibasilar crackles noted. Abdomen:  BS+, soft, non tender and ND. Musculoskeletal:  +1 edema and -tenderness. Skin:  1+ Edema.  LABS:  BMET  Recent Labs Lab 07/19/15 0454 07/20/15 0503 07/21/15 0450  NA 140 141 135  K 3.2* 3.1* 3.0*  CL 106 108 105  CO2 27 25 23   BUN 15 7 <5*  CREATININE 0.56 0.54 0.59  GLUCOSE 145* 107* 296*    Electrolytes  Recent Labs Lab 07/19/15 0454 07/20/15 0503 07/21/15 0450  CALCIUM 8.7* 8.6* 8.4*  MG 1.8 1.8 1.7  PHOS 2.5 2.5 2.6    CBC  Recent Labs Lab 07/19/15 0454 07/20/15 0503 07/21/15 0450  WBC 11.4* 10.4 9.4  HGB 8.7* 8.2* 7.8*  HCT 25.1* 24.4* 23.0*  PLT  175 187 219    Coag's No results for input(s): APTT, INR in the last 168 hours.  Sepsis Markers No results for input(s): LATICACIDVEN, PROCALCITON, O2SATVEN in the last 168 hours.  ABG No results for input(s): PHART, PCO2ART, PO2ART in the last 168 hours.  Liver Enzymes No results for input(s): AST, ALT, ALKPHOS, BILITOT, ALBUMIN in the last 168 hours.  Cardiac Enzymes No results for input(s): TROPONINI, PROBNP in the last 168 hours.  Glucose  Recent Labs Lab 07/20/15 1230 07/20/15 1643 07/20/15 1929 07/20/15 2349 07/21/15 0421 07/21/15 0758  GLUCAP 92 92 118* 118* 95 122*    Imaging I reviewed CXR myself, bibasilar atelectasis noted.  STUDIES:  1/8 CT head > no acute changes 1/9 MRI Brain >> chronic MS changes but nothing acute CXR 1/17>> Reviewed. No acute infiltrate.  CULTURES: 1/8 Blood X2 > 1 of 2 coag neg staph >>  CSF 1/9 >> normal Urine 1/10 >> negative  ANTIBIOTICS: Vanc 1/8 > 1/10 Ceftriaxone x1 1/8 Ceftaz 1/8 > 1/10 Acyclovir 1/8 > 1/10 Zosyn 1/10 >> 1/14  SIGNIFICANT EVENTS:  LINES/TUBES: 1/8 ETT >   DISCUSSION: 55 y.o female with multiple sclerosis who lives in a SNF and has recurrent UTI's was admitted from the [facility] ER with acute encephalopathy of uncertain etiology.  ASSESSMENT / PLAN:  PULMONARY A: Acute respiratory failure with  hypoxemia P:   Extubated and passed swallow evaluation, dysphagia 2.  Will wean down O2 as tolerated. Incentive spirometer Get OOB, mobilize, PT Give lasix 20 mg IV q8 x2 doses.  CARDIOVASCULAR A:  HTN P:  Tele Continue Coreg  25 bid  Spironolactone  RENAL A:   Hypernatremia Hypokalemia AKI uncertain etiology, improving P:   Renal dose meds. KVO IVF. Monitor BMET and UOP. Replace electrolytes as needed. D/C free water.   GASTROINTESTINAL A:   No acute issues P:   Swallow eval dysphagia 2. PPI.  HEMATOLOGIC A:   No acute issues P:  Monitor for bleeding DVT  prophylaxis  INFECTIOUS A:   Recurrent UTI, pyuria on admission, recent acetobacter Coag neg staph 1 of 2, likely contaminant P:   D/c vanco + acyclovir on 1/10 D/c ceftaz 1/10 given potential for neuro effects Completed zosyn 1/10 - 1/14 for presumed residual acinetobacter UTI; does not have the neuro effects that can be seen w cephalosporins.   ENDOCRINE A:   Possible adrenal insuff s/p recent infection P:   Monitor glucose Hydrocortisone. Wean to 25 q12 then will likely d/c over the next 48 hours.  NEUROLOGIC A:   Acute encephalopathy, LP, MRI and EEG non-diagnostic. Consider UTI / sepsis, effects of meds including cephalosporin encephalopathy. Also considering adrenal insufficiency following recent infxn, treated w steroids ? Seizures > EEG unclear P:   Off dilantin as per neuro. Continue to hold most home meds. Changed abx to zosyn on 1/10, completed Empiric hydrocort added 1/11 Keep baclofen > decreased dose Intermittent fentanyl    FAMILY  - Updates: Spoke with the patient, she is relatively slow to process information, but reports she has a DNR in the SNF, case management to address obtaining that DNR. - Inter-disciplinary family meet or Palliative Care meeting due by:  07/16/14  Discussed with TRH-MD.  Will transfer care to [facility], PCCM will sign off, please call back if needed.  Rush Farmer, M.D. [facility] Pulmonary/Critical Care Medicine. Pager: [phone]. After hours pager: [phone].

## 2015-07-21 NOTE — NC FL2 (Signed)
[place] MEDICAID FL2 LEVEL OF CARE SCREENING TOOL     IDENTIFICATION  Patient Name: Shannon Garrett Birthdate: 07-08-1960 Sex: female Admission Date (Current Location): 07/10/2015  [facility] and [place] Number:  Herbalist and Address:  [facility], [place] [address], [place], [place] [address]      Provider Number: [id number]  Attending Physician Name and Address:  Juanito Doom, MD  Relative Name and Phone Number:  Lawson Fiscal [phone]    Current Level of Care: Hospital Recommended Level of Care: [place] Prior Approval Number:    Date Approved/Denied:   PASRR Number:    Discharge Plan: SNF    Current Diagnoses: Patient Active Problem List   Diagnosis Date Noted  . Altered mental status   . Acute respiratory failure ([place])   . Gram-negative infection 07/06/2015  . Sepsis ([place]) 07/04/2015  . Vitamin D deficiency 06/18/2015  . Multiple sclerosis exacerbation ([place]) 06/08/2015  . Weakness 06/08/2015  . Chronic systolic (congestive) heart failure ([place]) 06/08/2015  . Acute encephalopathy 06/03/2015  . Acute kidney injury ([place]) 06/03/2015  . Hypokalemia 06/03/2015  . Sepsis secondary to UTI ([place]) 05/30/2015  . Hypernatremia 05/30/2015  . Dehydration 05/30/2015  . Chronic anemia 05/30/2015  . Chronic pain syndrome 03/03/2015  . Benign hypertensive heart disease without heart failure 01/18/2015  . Urinary tract infection due to Enterococcus   . Essential hypertension   . Abnormal LFTs   . Foot drop, bilateral   . Pressure ulcer 01/12/2015  . UTI (lower urinary tract infection) 01/11/2015  . Hypovolemia 01/11/2015  . UTI (urinary tract infection), uncomplicated   . Cardiomyopathy ([place])   . Urinary incontinence 01/07/2015  . Chronic constipation 11/26/2014  . Depression 11/26/2014  . Tachycardia 02/20/2014  . Venous stasis ulcers ([place]) 02/20/2014  . Leg wound, left 11/04/2013  . FTT (failure to thrive) in  adult 07/31/2013  . Medically noncompliant 07/31/2013  . Blisters of multiple sites 07/31/2013  . Generalized weakness 06/03/2013  . Insomnia 06/03/2013  . Neurogenic bladder 06/03/2013  . DS (disseminated sclerosis) ([place]) 02/12/2013  . Chronic diastolic heart failure ([place]) 09/29/2012  . Elevated CK 09/10/2012  . Severe malnutrition ([place]) 09/05/2012  . Paraplegia ([place]) 09/05/2012  . Fall 09/04/2012  . Chronic combined systolic and diastolic congestive heart failure ([place]) 05/22/2012  . Other primary cardiomyopathies 05/01/2012  . Immobility 04/28/2012  . OBESITY 11/30/2008  . ANEMIA, CHRONIC 11/30/2008  . MULTIPLE SCLEROSIS, PROGRESSIVE/RELAPSING 11/30/2008  . Hypertensive heart disease with CHF (congestive heart failure) ([place]) 11/30/2008  . ECZEMA 11/30/2008    Orientation RESPIRATION BLADDER Height & Weight    Self, Situation  Normal Indwelling catheter 5\' 2"  (157.5 cm) 217 lbs.  BEHAVIORAL SYMPTOMS/MOOD NEUROLOGICAL BOWEL NUTRITION STATUS      Continent Diet (dysphasia II)  AMBULATORY STATUS COMMUNICATION OF NEEDS Skin   Total Care Verbally Other (Comment) (deep tissue injury)                       Personal Care Assistance Level of Assistance  Bathing, Feeding, Dressing Bathing Assistance: Maximum assistance Feeding assistance: Maximum assistance Dressing Assistance: Maximum assistance     Functional Limitations Info             SPECIAL CARE FACTORS FREQUENCY  PT (By licensed PT)     PT Frequency: 5/wk              Contractures      Additional Factors Info  Code Status, Allergies Code Status Info: DNR Allergies Info: Sulfonamide Derivatives, Penicillins           Current Medications (07/21/2015):  This is the current hospital active medication list Current Facility-Administered Medications  Medication Dose Route Frequency Provider Last Rate Last Dose  . 0.9 %  sodium chloride infusion  250 mL Intravenous PRN Juanito Doom, MD      .  antiseptic oral rinse (CPC / CETYLPYRIDINIUM CHLORIDE 0.05%) solution 7 mL  7 mL Mouth Rinse BID Juanito Doom, MD   7 mL at 07/20/15 2200  . baclofen (LIORESAL) tablet 5 mg  5 mg Oral TID Collene Gobble, MD   5 mg at 07/21/15 0941  . carvedilol (COREG) tablet 25 mg  25 mg Per Tube BID WC Collene Gobble, MD   25 mg at 07/21/15 0941  . chlorhexidine gluconate (PERIDEX) 0.12 % solution 15 mL  15 mL Mouth Rinse BID Juanito Doom, MD   15 mL at 07/20/15 2000  . darifenacin (ENABLEX) 24 hr tablet 15 mg  15 mg Oral Daily Juanito Doom, MD   15 mg at 07/21/15 1350  . docusate sodium (COLACE) capsule 100 mg  100 mg Oral BID Juanito Doom, MD   100 mg at 07/21/15 0942  . famotidine (PEPCID) tablet 20 mg  20 mg Oral BID Jaquita Folds, RPH   20 mg at 07/21/15 0942  . feeding supplement (ENSURE ENLIVE) (ENSURE ENLIVE) liquid 237 mL  237 mL Oral BID BM Rogue Bussing, RD      . fentaNYL (SUBLIMAZE) injection 100 mcg  100 mcg Intravenous Q2H PRN Juanito Doom, MD   100 mcg at 07/18/15 1238  . furosemide (LASIX) injection 20 mg  20 mg Intravenous Q8H Rush Farmer, MD   20 mg at 07/21/15 1349  . heparin injection 5,000 Units  5,000 Units Subcutaneous 3 times per day Greta Doom, MD   5,000 Units at 07/21/15 1349  . hydrALAZINE (APRESOLINE) injection 10 mg  10 mg Intravenous Q4H PRN Praveen Mannam, MD   10 mg at 07/14/15 1714  . hydrocortisone sodium succinate (SOLU-CORTEF) 100 MG injection 50 mg  50 mg Intravenous Q12H Praveen Mannam, MD   50 mg at 07/21/15 0540  . latanoprost (XALATAN) 0.005 % ophthalmic solution 1 drop  1 drop Both Eyes QHS Juanito Doom, MD   1 drop at 07/20/15 2207  . polyvinyl alcohol (LIQUIFILM TEARS) 1.4 % ophthalmic solution 1 drop  1 drop Both Eyes BID Juanito Doom, MD   1 drop at 07/20/15 2207  . potassium chloride SA (K-DUR,KLOR-CON) CR tablet 40 mEq  40 mEq Oral TID Rush Farmer, MD      . spironolactone (ALDACTONE) tablet 25 mg  25 mg  Per Tube Daily Collene Gobble, MD   25 mg at 07/21/15 1349     Discharge Medications: Please see discharge summary for a list of discharge medications.  Relevant Imaging Results:  Relevant Lab Results:   Additional Information SS # [id number]  Cranford Mon, [place]

## 2015-07-22 DIAGNOSIS — G35 Multiple sclerosis: Secondary | ICD-10-CM

## 2015-07-22 LAB — BASIC METABOLIC PANEL
ANION GAP: 8 (ref 5–15)
BUN: 5 mg/dL — ABNORMAL LOW (ref 6–20)
CALCIUM: 8.3 mg/dL — AB (ref 8.9–10.3)
CO2: 24 mmol/L (ref 22–32)
Chloride: 110 mmol/L (ref 101–111)
Creatinine, Ser: 0.64 mg/dL (ref 0.44–1.00)
GFR calc Af Amer: >60 mL/min (ref >60)
GLUCOSE: 118 mg/dL — AB (ref 65–99)
Potassium: 3.2 mmol/L — ABNORMAL LOW (ref 3.5–5.1)
Sodium: 142 mmol/L (ref 135–145)

## 2015-07-22 LAB — CBC
HCT: 25 % — ABNORMAL LOW (ref 36.0–46.0)
Hemoglobin: 8.1 g/dL — ABNORMAL LOW (ref 12.0–15.0)
MCH: 26 pg (ref 26.0–34.0)
MCHC: 32.4 g/dL (ref 30.0–36.0)
MCV: 80.4 fL (ref 78.0–100.0)
PLATELETS: 251 10*3/uL (ref 150–400)
RBC: 3.11 MIL/uL — ABNORMAL LOW (ref 3.87–5.11)
RDW: 16.6 % — AB (ref 11.5–15.5)
WBC: 6 10*3/uL (ref 4.0–10.5)

## 2015-07-22 LAB — PHOSPHORUS: Phosphorus: 2.9 mg/dL (ref 2.5–4.6)

## 2015-07-22 LAB — MAGNESIUM: Magnesium: 1.6 mg/dL — ABNORMAL LOW (ref 1.7–2.4)

## 2015-07-22 MED ORDER — ONDANSETRON HCL 4 MG/2ML IJ SOLN
4.0000 mg | Freq: Four times a day (QID) | INTRAMUSCULAR | Status: DC | PRN
  Administered 2015-07-22: 4 mg via INTRAVENOUS
  Filled 2015-07-22: qty 2

## 2015-07-22 MED ORDER — POTASSIUM CHLORIDE CRYS ER 20 MEQ PO TBCR
40.0000 meq | EXTENDED_RELEASE_TABLET | Freq: Once | ORAL | Status: AC
  Administered 2015-07-22: 40 meq via ORAL
  Filled 2015-07-22: qty 2

## 2015-07-22 MED ORDER — MAGNESIUM SULFATE 2 GM/50ML IV SOLN
2.0000 g | Freq: Once | INTRAVENOUS | Status: AC
  Administered 2015-07-22: 2 g via INTRAVENOUS
  Filled 2015-07-22: qty 50

## 2015-07-22 MED ORDER — SPIRONOLACTONE 25 MG PO TABS
25.0000 mg | ORAL_TABLET | Freq: Every day | ORAL | Status: DC
  Administered 2015-07-23 – 2015-07-25 (×3): 25 mg via ORAL
  Filled 2015-07-22 (×3): qty 1

## 2015-07-22 MED ORDER — PREDNISONE 20 MG PO TABS
20.0000 mg | ORAL_TABLET | Freq: Two times a day (BID) | ORAL | Status: AC
  Administered 2015-07-22 – 2015-07-23 (×3): 20 mg via ORAL
  Filled 2015-07-22 (×3): qty 1

## 2015-07-22 MED ORDER — CARVEDILOL 12.5 MG PO TABS
25.0000 mg | ORAL_TABLET | Freq: Two times a day (BID) | ORAL | Status: DC
  Administered 2015-07-22 – 2015-07-25 (×7): 25 mg via ORAL
  Filled 2015-07-22: qty 2
  Filled 2015-07-22 (×2): qty 1
  Filled 2015-07-22 (×3): qty 2
  Filled 2015-07-22: qty 1

## 2015-07-22 NOTE — Care Management Important Message (Signed)
Important Message  Patient Details  Name: Quillie Muratori MRN: [id number] Date of Birth: 11/13/1960   Medicare Important Message Given:  Yes    Allsion Nogales Abena 07/22/2015, 3:11 PM

## 2015-07-22 NOTE — Progress Notes (Signed)
[place] TEAM 1 - Stepdown/ICU TEAM PROGRESS NOTE  Shannon Garrett [id number] DOB: 1961-04-25 DOA: 07/10/2015 PCP: Hennie Duos, MD  Admit HPI / Brief Narrative: 55 y/o female with a history of MS and multiple admissions for foley catheter associated UTI admitted 07/09/2014 after being brought in by EMS from her SNF. She was discharged from [facility] on 1/6 after being treated for a UTI w/ Acinetobacter w/ on Cipro. The day of her admit she was found nearly unresponsive in her SNF.   Significant Events: 1/8 admitted - intubated  1/8 CT head > no acute changes 1/9 LP 1/9 MRI Brain > chronic MS changes but nothing acute 1/18 extubated   HPI/Subjective: The patient is alert but confused.  She cannot provide a reliable history.  She does not appear to be in acute distress.  Assessment/Plan:  Acute encephalopathy LP, MRI and EEG non-diagnostic - ?UTI / sepsis v/s effects of meds including cephalosporin - ?adrenal insufficiency following recent infxn - ammonia normal - B12 normal/high - TSH ok - continue to follow closely  Acute respiratory failure with hypoxemia Extubated - passed swallow evaluation, dysphagia 2- no longer hypoxemic  Recurrent UTI pyuria on admission, recent acetobacter - completed zosyn 1/10 - 1/14 for presumed residual acinetobacter UTI- no new culture data  AKI uncertain etiology - resolved with normal renal function at present  HTN Blood pressure currently recently controlled  Hypernatremia Resolved   Hypomagnesemia  Replace and follow   Hypokalemia Replace and follow   Coag neg staph 1 of 2 likely contaminant - no persisting symptoms to suggest infection  Possible adrenal insuff s/p recent infection Cortisol low at 1.4 1/10 - muted response on ACTH stim testing - taper steroids and retest in outpatient setting  Code Status: NO CODE  Family Communication: no family present at time of exam Disposition Plan:  SDU  Consultants: PCCM Neurology   Antibiotics: Vanc 1/8 > 1/10 Ceftriaxone 1/8 Ceftaz 1/8 > 1/10 Acyclovir 1/8 > 1/10 Zosyn 1/10 >> 1/14  DVT prophylaxis: SQ heparin   Objective: Blood pressure 117/78, pulse 88, temperature 98.7 F (37.1 C), temperature source Oral, resp. rate 20, height 5\' 2"  (1.575 m), weight 95.754 kg (211 lb 1.6 oz), SpO2 99 %.  Intake/Output Summary (Last 24 hours) at 07/22/15 0903 Last data filed at 07/22/15 0410  Gross per 24 hour  Intake    480 ml  Output   2525 ml  Net  -2045 ml   Exam: General: No acute respiratory distress at rest in bed Lungs: Clear to auscultation bilaterally without wheezes or crackles Cardiovascular: Regular rate and rhythm without murmur gallop or rub normal S1 and S2 Abdomen: Nontender, nondistended, soft, bowel sounds positive, no rebound, no ascites, no appreciable mass Extremities: No significant cyanosis, clubbing, or edema bilateral lower extremities  Data Reviewed: Basic Metabolic Panel:  Recent Labs Lab 07/17/15 0520 07/18/15 0457 07/19/15 0454 07/20/15 0503 07/21/15 0450 07/22/15 0540  NA 146* 143 140 141 135 142  K 3.2* 3.4* 3.2* 3.1* 3.0* 3.2*  CL 112* 110 106 108 105 110  CO2 28 27 27 25 23 24   GLUCOSE 161* 152* 145* 107* 296* 118*  BUN 19 18 15 7  <5* 5*  CREATININE 0.62 0.58 0.56 0.54 0.59 0.64  CALCIUM 8.8* 8.6* 8.7* 8.6* 8.4* 8.3*  MG 1.8  --  1.8 1.8 1.7 1.6*  PHOS 2.5  --  2.5 2.5 2.6 2.9    CBC:  Recent Labs Lab 07/17/15 0520 07/19/15 0454  07/20/15 0503 07/21/15 0450 07/22/15 0540  WBC 11.8* 11.4* 10.4 9.4 6.0  HGB 9.3* 8.7* 8.2* 7.8* 8.1*  HCT 27.9* 25.1* 24.4* 23.0* 25.0*  MCV 81.1 79.7 81.1 78.2 80.4  PLT 194 175 187 219 251    Liver Function Tests: No results for input(s): AST, ALT, ALKPHOS, BILITOT, PROT, ALBUMIN in the last 168 hours. No results for input(s): LIPASE, AMYLASE in the last 168 hours. No results for input(s): AMMONIA in the last 168  hours.  CBG:  Recent Labs Lab 07/20/15 2349 07/21/15 0421 07/21/15 0758 07/21/15 1137 07/21/15 1551  GLUCAP 118* 95 122* 126* 132*    Recent Results (from the past 240 hour(s))  Culture, Urine     Status: None   Collection Time: 07/12/15  3:50 PM  Result Value Ref Range Status   Specimen Description URINE, CATHETERIZED  Final   Special Requests NONE  Final   Culture NO GROWTH 1 DAY  Final   Report Status 07/13/2015 FINAL  Final     Studies:   Recent x-ray studies have been reviewed in detail by the Attending Physician  Scheduled Meds:  Scheduled Meds: . antiseptic oral rinse  7 mL Mouth Rinse BID  . baclofen  5 mg Oral TID  . carvedilol  25 mg Per Tube BID WC  . chlorhexidine gluconate  15 mL Mouth Rinse BID  . darifenacin  15 mg Oral Daily  . docusate sodium  100 mg Oral BID  . famotidine  20 mg Oral BID  . feeding supplement (ENSURE ENLIVE)  237 mL Oral BID BM  . heparin  5,000 Units Subcutaneous 3 times per day  . hydrocortisone sod succinate (SOLU-CORTEF) inj  50 mg Intravenous Q12H  . latanoprost  1 drop Both Eyes QHS  . polyvinyl alcohol  1 drop Both Eyes BID  . spironolactone  25 mg Per Tube Daily    Time spent on care of this patient: 35 mins   MCCLUNG,JEFFREY T , MD   [facility] Office  [phone] Pager - Text Page per Shea Evans as per below:  On-Call/Text Page:      [email or web address]      password [email or web address]  If 7PM-7AM, please contact night-coverage [email or web address] Password [email or web address] 07/22/2015, 9:03 AM   LOS: 12 days

## 2015-07-23 LAB — BASIC METABOLIC PANEL
ANION GAP: 8 (ref 5–15)
BUN: 7 mg/dL (ref 6–20)
CALCIUM: 9.1 mg/dL (ref 8.9–10.3)
CO2: 25 mmol/L (ref 22–32)
Chloride: 109 mmol/L (ref 101–111)
Creatinine, Ser: 0.67 mg/dL (ref 0.44–1.00)
Glucose, Bld: 135 mg/dL — ABNORMAL HIGH (ref 65–99)
Potassium: 4.9 mmol/L (ref 3.5–5.1)
Sodium: 142 mmol/L (ref 135–145)

## 2015-07-23 LAB — CBC
HCT: 25.5 % — ABNORMAL LOW (ref 36.0–46.0)
Hemoglobin: 8.7 g/dL — ABNORMAL LOW (ref 12.0–15.0)
MCH: 27.1 pg (ref 26.0–34.0)
MCHC: 34.1 g/dL (ref 30.0–36.0)
MCV: 79.4 fL (ref 78.0–100.0)
PLATELETS: 304 10*3/uL (ref 150–400)
RBC: 3.21 MIL/uL — AB (ref 3.87–5.11)
RDW: 16.8 % — ABNORMAL HIGH (ref 11.5–15.5)
WBC: 7.9 10*3/uL (ref 4.0–10.5)

## 2015-07-23 LAB — MAGNESIUM: Magnesium: 2.2 mg/dL (ref 1.7–2.4)

## 2015-07-23 MED ORDER — VITAMIN B-12 1000 MCG PO TABS
1000.0000 ug | ORAL_TABLET | Freq: Every day | ORAL | Status: DC
  Administered 2015-07-23 – 2015-07-25 (×3): 1000 ug via ORAL
  Filled 2015-07-23 (×3): qty 1

## 2015-07-23 MED ORDER — AMANTADINE HCL 100 MG PO CAPS
100.0000 mg | ORAL_CAPSULE | Freq: Every day | ORAL | Status: DC
  Administered 2015-07-23 – 2015-07-25 (×3): 100 mg via ORAL
  Filled 2015-07-23 (×3): qty 1

## 2015-07-23 MED ORDER — PREDNISONE 5 MG PO TABS
10.0000 mg | ORAL_TABLET | Freq: Two times a day (BID) | ORAL | Status: DC
  Administered 2015-07-24 – 2015-07-25 (×4): 10 mg via ORAL
  Filled 2015-07-23 (×4): qty 2

## 2015-07-23 NOTE — Progress Notes (Signed)
[place] TEAM 1 - Stepdown/ICU TEAM PROGRESS NOTE  Shannon Garrett [id number] DOB: 07/18/1960 DOA: 07/10/2015 PCP: Hennie Duos, MD  Admit HPI / Brief Narrative: 55 y/o female with a history of MS and multiple admissions for foley catheter associated UTI admitted 07/09/2014 after being brought in by EMS from her SNF. She was discharged from [facility] on 1/6 after being treated for a UTI w/ Acinetobacter w/ on Cipro. The day of her admit she was found nearly unresponsive in her SNF.   Significant Events: 1/8 admitted - intubated  1/8 CT head > no acute changes 1/9 LP 1/9 MRI Brain > chronic MS changes but nothing acute 1/18 extubated   HPI/Subjective: The patient is alert but remains somewhat lethargic and confused, though she is in no apparent acute distress.  She is able to answer my questions, but it takes her an unusually long period of time to do so.    Assessment/Plan:  Acute encephalopathy LP, MRI and EEG non-diagnostic - ?UTI / sepsis v/s effects of meds including cephalosporin - ?adrenal insufficiency following recent infxn - ammonia normal - B12 normal/high - TSH ok - continue to follow closely - ?ICU delirium at this point - transfer to med bed and follow mental state  Acute respiratory failure with hypoxemia - resolved Extubated - passed swallow evaluation, dysphagia 2- no longer hypoxemic  Recurrent UTI pyuria on admission, recent acetobacter - completed zosyn 1/10 - 1/14 for presumed residual acinetobacter UTI- no new organisms noted on cultures   AKI uncertain etiology - resolved   HTN Blood pressure currently controlled  Hypernatremia Resolved   Hypomagnesemia  Replaced to normal   Hypokalemia Replaced to normal   Coag neg staph 1 of 2 likely contaminant - no persisting symptoms to suggest infection  Possible adrenal insuff s/p recent infection Cortisol low at 1.4 1/10 - muted response on ACTH stim testing - taper steroids and  retest in outpatient setting  Obesity - Body mass index is 38.02 kg/(m^2).  Code Status: NO CODE  Family Communication: no family present at time of exam Disposition Plan: transfer to medical bed - follow mental status - eventual return to SNF   Consultants:  PCCM Neurology   Antibiotics: Vanc 1/8 > 1/10 Ceftriaxone 1/8 Ceftaz 1/8 > 1/10 Acyclovir 1/8 > 1/10 Zosyn 1/10 >> 1/14  DVT prophylaxis: SQ heparin   Objective: Blood pressure 123/80, pulse 82, temperature 99.1 F (37.3 C), temperature source Oral, resp. rate 16, height 5\' 2"  (1.575 m), weight 94.303 kg (207 lb 14.4 oz), SpO2 100 %.  Intake/Output Summary (Last 24 hours) at 07/23/15 1501 Last data filed at 07/23/15 1300  Gross per 24 hour  Intake    540 ml  Output   2250 ml  Net  -1710 ml   Exam: General: No acute respiratory distress  Lungs: Clear to auscultation bilaterally without wheezes/crackles Cardiovascular: Regular rate and rhythm without murmur gallop or rub  Abdomen: Nontender, obese, soft, bowel sounds positive, no rebound, no ascites, no appreciable mass Extremities: No significant cyanosis, or clubbing; 1+ edema bilateral lower extremities  Data Reviewed: Basic Metabolic Panel:  Recent Labs Lab 07/17/15 0520  07/19/15 0454 07/20/15 0503 07/21/15 0450 07/22/15 0540 07/23/15 0450  NA 146*  < > 140 141 135 142 142  K 3.2*  < > 3.2* 3.1* 3.0* 3.2* 4.9  CL 112*  < > 106 108 105 110 109  CO2 28  < > 27 25 23 24 25   GLUCOSE 161*  < >  145* 107* 296* 118* 135*  BUN 19  < > 15 7 <5* 5* 7  CREATININE 0.62  < > 0.56 0.54 0.59 0.64 0.67  CALCIUM 8.8*  < > 8.7* 8.6* 8.4* 8.3* 9.1  MG 1.8  --  1.8 1.8 1.7 1.6* 2.2  PHOS 2.5  --  2.5 2.5 2.6 2.9  --   < > = values in this interval not displayed.  CBC:  Recent Labs Lab 07/19/15 0454 07/20/15 0503 07/21/15 0450 07/22/15 0540 07/23/15 0450  WBC 11.4* 10.4 9.4 6.0 7.9  HGB 8.7* 8.2* 7.8* 8.1* 8.7*  HCT 25.1* 24.4* 23.0* 25.0* 25.5*  MCV 79.7  81.1 78.2 80.4 79.4  PLT 175 187 219 251 304    Liver Function Tests: No results for input(s): AST, ALT, ALKPHOS, BILITOT, PROT, ALBUMIN in the last 168 hours. No results for input(s): LIPASE, AMYLASE in the last 168 hours. No results for input(s): AMMONIA in the last 168 hours.  CBG:  Recent Labs Lab 07/20/15 2349 07/21/15 0421 07/21/15 0758 07/21/15 1137 07/21/15 1551  GLUCAP 118* 95 122* 126* 132*    Studies:   Recent x-ray studies have been reviewed in detail by the Attending Physician  Scheduled Meds:  Scheduled Meds: . antiseptic oral rinse  7 mL Mouth Rinse BID  . baclofen  5 mg Oral TID  . carvedilol  25 mg Oral BID WC  . chlorhexidine gluconate  15 mL Mouth Rinse BID  . darifenacin  15 mg Oral Daily  . docusate sodium  100 mg Oral BID  . famotidine  20 mg Oral BID  . feeding supplement (ENSURE ENLIVE)  237 mL Oral BID BM  . heparin  5,000 Units Subcutaneous 3 times per day  . latanoprost  1 drop Both Eyes QHS  . polyvinyl alcohol  1 drop Both Eyes BID  . predniSONE  20 mg Oral BID WC  . spironolactone  25 mg Oral Daily    Time spent on care of this patient: 35 mins   MCCLUNG,JEFFREY T , MD   [facility] Office  [phone] Pager - Text Page per Shea Evans as per below:  On-Call/Text Page:      [email or web address]      password [email or web address]  If 7PM-7AM, please contact night-coverage [email or web address] Password [email or web address] 07/23/2015, 3:01 PM   LOS: 13 days

## 2015-07-23 NOTE — Progress Notes (Signed)
07/23/2015 Rn went to check on patient on 07/22/2015 she had one cigarette in her mouth and the other in her hand. Rico Sheehan RN

## 2015-07-24 LAB — GLUCOSE, CAPILLARY
Glucose-Capillary: 145 mg/dL — ABNORMAL HIGH (ref 65–99)
Glucose-Capillary: 97 mg/dL (ref 65–99)
Glucose-Capillary: 87 mg/dL (ref 65–99)

## 2015-07-24 NOTE — Progress Notes (Signed)
Received from step down.  Transferred in bed.  Disoriented to situation, hospital.  Able to name month, day.

## 2015-07-24 NOTE — Progress Notes (Signed)
[facility]                                                                              Patient Demographics  Shannon Garrett, is a 55 y.o. female, DOB - [birth year], [id number]  Admit date - 07/10/2015   Admitting Physician Juanito Doom, MD  Outpatient Primary MD for the patient is Hennie Duos, MD  LOS - 14   Chief Complaint  Patient presents with  . Altered Mental Status       Brief HPI   55 y/o female with a history of MS and multiple admissions for foley catheter associated UTI admitted 07/09/2014 after being brought in by EMS from her SNF. She was discharged from [facility] on 1/6 after being treated for a UTI w/ Acinetobacter w/ on Cipro. The day of her admit she was found nearly unresponsive in her SNF.   Significant Events: 1/8 admitted - intubated  1/8 CT head > no acute changes 1/9 LP 1/9 MRI Brain > chronic MS changes but nothing acute 1/18 extubated   Assessment & Plan   Acute encephalopathy- still appears to be somewhat confused - Patient underwent extensive neurological testing including LP, MRI and EEG non-diagnostic  - Urine culture showed Acinetobacter, completed Zosyn 1/10-1/14, repeat urine culture negative so far - Ammonia level normal, B12 normal, TSH normal - PTOT evaluation recommended skilled nursing facility  Acute respiratory failure with hypoxemia - Resolved, extubated on 1/18  - Passed a swallow testing, on dysphagia 2 diet   AKI uncertain etiology - resolved with normal renal function at present  HTN Blood pressure currently recently controlled  Hypernatremia Resolved   Hypomagnesemia  Resolved  Hypokalemia Resolved  Coag neg staph 1 of 2 likely contaminant - no persisting symptoms to suggest infection  Possible adrenal insuff s/p recent infection Cortisol low at 1.4 1/10 - muted response on ACTH stim testing - taper steroids and retest in outpatient setting  Code Status: DO NOT  RESUSCITATE status  Family Communication: Discussed in detail with the patient, all imaging results, lab results explained to the patient. No family member at the bedside   Disposition Plan:  Skilled nursing facility when medically ready  Time Spent in minutes  25 minutes  Procedures  Intubation Lumbar puncture MRI brain EEG  Consults   PCCM Neurology   DVT Prophylaxis   heparin   Medications  Scheduled Meds: . amantadine  100 mg Oral Daily  . antiseptic oral rinse  7 mL Mouth Rinse BID  . baclofen  5 mg Oral TID  . carvedilol  25 mg Oral BID WC  . chlorhexidine gluconate  15 mL Mouth Rinse BID  . darifenacin  15 mg Oral Daily  . docusate sodium  100 mg Oral BID  . famotidine  20 mg Oral BID  . feeding supplement (ENSURE ENLIVE)  237 mL Oral BID BM  . heparin  5,000 Units Subcutaneous 3 times per day  . latanoprost  1 drop Both Eyes QHS  . polyvinyl alcohol  1 drop Both Eyes BID  . predniSONE  10 mg Oral BID WC  . spironolactone  25 mg Oral Daily  . vitamin B-12  1,000 mcg Oral Daily   Continuous Infusions:  PRN Meds:.hydrALAZINE, ondansetron (ZOFRAN) IV   Antibiotics   Anti-infectives    Start     Dose/Rate Route Frequency Ordered Stop   07/12/15 1200  piperacillin-tazobactam (ZOSYN) IVPB 3.375 g  Status:  Discontinued     3.375 g 12.5 mL/hr over 240 Minutes Intravenous Every 8 hours 07/12/15 1032 07/16/15 0708   07/12/15 0200  acyclovir (ZOVIRAX) 500 mg in dextrose 5 % 100 mL IVPB  Status:  Discontinued     10 mg/kg  50.1 kg (Ideal) 110 mL/hr over 60 Minutes Intravenous Every 8 hours 07/11/15 1800 07/11/15 1801   07/11/15 1830  acyclovir (ZOVIRAX) 500 mg in dextrose 5 % 100 mL IVPB  Status:  Discontinued     10 mg/kg  50.1 kg (Ideal) 110 mL/hr over 60 Minutes Intravenous Every 8 hours 07/11/15 1801 07/12/15 0808   07/11/15 0600  vancomycin (VANCOCIN) IVPB 750 mg/150 ml premix  Status:  Discontinued     750 mg 150 mL/hr over 60 Minutes Intravenous Every  12 hours 07/10/15 2045 07/12/15 0808   07/10/15 2200  cefTAZidime (FORTAZ) 2 g in dextrose 5 % 50 mL IVPB  Status:  Discontinued     2 g 100 mL/hr over 30 Minutes Intravenous Every 12 hours 07/10/15 2045 07/12/15 0808   07/10/15 1630  acyclovir (ZOVIRAX) 500 mg in dextrose 5 % 100 mL IVPB  Status:  Discontinued     10 mg/kg  50.1 kg (Ideal) 110 mL/hr over 60 Minutes Intravenous 3 times per day 07/10/15 1540 07/11/15 1800   07/10/15 1545  vancomycin (VANCOCIN) IVPB 1000 mg/200 mL premix     1,000 mg 200 mL/hr over 60 Minutes Intravenous  Once 07/10/15 1538 07/10/15 1847   07/10/15 1545  cefTRIAXone (ROCEPHIN) 1 g in dextrose 5 % 50 mL IVPB     1 g 100 mL/hr over 30 Minutes Intravenous  Once 07/10/15 1538 07/10/15 1745   07/10/15 1545  acyclovir (ZOVIRAX) 1,000 mg in dextrose 5 % 150 mL IVPB  Status:  Discontinued     10 mg/kg  99.8 kg 170 mL/hr over 60 Minutes Intravenous 3 times per day 07/10/15 1538 07/10/15 1539        Subjective:   Shannon Garrett was seen and examined today. Alert but still somewhat confused. Knows that she is in the hospital and month.  Patient denies dizziness, chest pain, shortness of breath, abdominal pain. No acute events overnight.    Objective:   Blood pressure 142/74, pulse 72, temperature 98.9 F (37.2 C), temperature source Axillary, resp. rate 20, height 5\' 2"  (1.575 m), weight 88.905 kg (196 lb), SpO2 100 %.  Wt Readings from Last 3 Encounters:  07/24/15 88.905 kg (196 lb)  07/07/15 99.783 kg (219 lb 15.7 oz)  06/12/15 99.9 kg (220 lb 3.8 oz)     Intake/Output Summary (Last 24 hours) at 07/24/15 1139 Last data filed at 07/24/15 1015  Gross per 24 hour  Intake    480 ml  Output   1375 ml  Net   -895 ml    Exam  General: Alert and oriented x2, NAD  HEENT:  PERRLA, EOMI, Anicteric Sclera, mucous membranes moist.   Neck: Supple, no JVD, no masses  CVS: S1 S2 auscultated, no rubs, murmurs or gallops. Regular rate and  rhythm.  Respiratory: Clear to auscultation bilaterally, no wheezing, rales or rhonchi  Abdomen: Soft, nontender, nondistended, +  bowel sounds  Ext: no cyanosis clubbing or edema  Neuro: AAOx2, Cr N's II- XII. Strength 5/5 upper extremities, 3-4 symmetrically lower extremities, poor effort  skin: No rashes  Psych: somewhat confused  , alert and oriented x2   Data Review   Micro Results No results found for this or any previous visit (from the past 240 hour(s)).  Radiology Reports Dg Chest 2 View  07/04/2015  CLINICAL DATA:  New altered mental status. EXAM: CHEST  2 VIEW COMPARISON:  06/08/2015 and 02/20/2014 FINDINGS: The heart size and mediastinal contours are within normal limits. Both lungs are clear. The visualized skeletal structures are unremarkable. IMPRESSION: Normal exam. Electronically Signed   By: Lorriane Shire M.D.   On: 07/04/2015 12:23   Ct Head Wo Contrast  07/10/2015  CLINICAL DATA:  Patient with possible aspiration. Possible change in mental status. EXAM: CT HEAD WITHOUT CONTRAST TECHNIQUE: Contiguous axial images were obtained from the base of the skull through the vertex without intravenous contrast. COMPARISON:  Brain CT 07/04/2015. FINDINGS: Ventricles and sulci are prominent compatible with atrophy. Periventricular and subcortical white matter hypodensity compatible with chronic white matter disease. No evidence for acute cortically based infarct, intracranial hemorrhage, mass lesion or mass-effect. Orbits are unremarkable. Mucosal thickening within the ethmoid air cells and frontal sinus. Mastoid air cells are unremarkable. Calvarium is intact. IMPRESSION: No acute intracranial process. Atrophy and chronic white matter disease. Electronically Signed   By: Lovey Newcomer M.D.   On: 07/10/2015 12:16   Ct Head Wo Contrast  07/04/2015  CLINICAL DATA:  55 year old female with acute altered mental status and confusion. History of multiple sclerosis. EXAM: CT HEAD WITHOUT  CONTRAST TECHNIQUE: Contiguous axial images were obtained from the base of the skull through the vertex without intravenous contrast. COMPARISON:  06/08/2015 and prior exams FINDINGS: Mild atrophy and unchanged white matter disease again noted. No acute intracranial abnormalities are identified, including mass lesion or mass effect, hydrocephalus, extra-axial fluid collection, midline shift, hemorrhage, or acute infarction. The visualized bony calvarium is unremarkable. IMPRESSION: No evidence of acute intracranial abnormality. Atrophy and unchanged white matter disease. Electronically Signed   By: Margarette Canada M.D.   On: 07/04/2015 12:16   Mr Jeri Cos [id number] Contrast  07/12/2015  ADDENDUM REPORT: 07/12/2015 08:29 ADDENDUM: Progressive opacification left petrous apex without surrounding abnormal enhancement. No obstructing lesion of the eustachian tube identified. Electronically Signed   By: Genia Del M.D.   On: 07/12/2015 08:29  07/12/2015  CLINICAL DATA:  55 year old hypertensive female with multiple sclerosis and mental status changes. Subsequent encounter. EXAM: MRI HEAD WITHOUT AND WITH CONTRAST TECHNIQUE: Multiplanar, multiecho pulse sequences of the brain and surrounding structures were obtained without and with intravenous contrast. CONTRAST:  19 cc MultiHance. COMPARISON:  07/10/2015 head CT. 06/08/2015, 10/08/2014 and 09/04/2012 brain MR. FINDINGS: Exam is motion degraded. No acute infarct or intracranial hemorrhage. Prominent white matter changes consistent with patient's history multiple sclerosis. No significant change from prior exams. No interval growth to suggest progressive multifocal leukoencephalopathy. No restricted motion or enhancement to suggest active demyelination. 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 Infundibulum remains deviated to the left. No other intracranial mass or abnormal enhancement. Nonspecific 1 cm right parotid lesion unchanged. Global atrophy without  hydrocephalus. Mild exophthalmos. Minimal mucosal thickening ethmoid sinus air cells. Major intracranial vascular structures are patent. IMPRESSION: Exam is motion degraded. No acute infarct or intracranial hemorrhage. Prominent white matter changes consistent with patient's history of multiple sclerosis. No significant  change from prior exams. No interval growth to suggest progressive multifocal leukoencephalopathy. No restricted motion or enhancement to suggest active demyelination. 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 Infundibulum remains deviated to the left. No other intracranial mass or abnormal enhancement. Global atrophy without hydrocephalus. Nonspecific 1 cm right parotid lesion unchanged. Electronically Signed: By: Genia Del M.D. On: 07/11/2015 14:51   Dg Chest Port 1 View  07/20/2015  CLINICAL DATA:  Follow-up respiratory failure EXAM: PORTABLE CHEST 1 VIEW COMPARISON:  07/19/2015 FINDINGS: Interval extubation and removal of NG tube. Right PICC line remains in place, unchanged. Mild cardiomegaly. Decreasing lung volumes with increasing bibasilar opacities, likely atelectasis. No visible significant effusions or acute bony abnormality. IMPRESSION: Interval extubation. Decreasing lung volumes with increasing bibasilar opacities, likely atelectasis. Electronically Signed   By: Rolm Baptise M.D.   On: 07/20/2015 08:17   Dg Chest Port 1 View  07/19/2015  CLINICAL DATA:  Respiratory failure EXAM: PORTABLE CHEST 1 VIEW COMPARISON:  07/16/2015 FINDINGS: Cardiomediastinal silhouette is stable. Endotracheal tube and NG tube are unchanged in position. Right arm PICC line is stable in position. Persistent nodular sclerotic focus right fourth rib anteriorly. No acute infiltrate or pulmonary edema. Minimal left basilar atelectasis. IMPRESSION: Stable support apparatus. Minimal left basilar atelectasis. No infiltrate or pulmonary edema. Electronically Signed   By: Lahoma Crocker M.D.    On: 07/19/2015 08:17   Dg Chest Port 1 View  07/16/2015  CLINICAL DATA:  Acute respiratory failure. Subsequent encounter. Intubated patient. EXAM: PORTABLE CHEST 1 VIEW COMPARISON:  07/13/2015 FINDINGS: Small nodules described in the lower lung zone on the prior study is less well-defined, now projecting closer to mid lung to differences in patient positioning. Atelectasis at the lung bases described previously has improved. No pleural effusion or pneumothorax. Cardiac silhouette is normal in size. No mediastinal or hilar masses. Endotracheal tube tip projects 3.5 cm above the carina. Nasogastric tube passes below the diaphragm well into the stomach. Right PICC tip projects in the lower superior vena cava. These are all stable. IMPRESSION: 1. Improved lung base atelectasis. Small right lung nodule described on the prior exam is less well-defined currently but otherwise unchanged. Lungs otherwise clear. 2. Support apparatus is stable and well positioned. Electronically Signed   By: Lajean Manes M.D.   On: 07/16/2015 08:00   Dg Chest Port 1 View  07/13/2015  CLINICAL DATA:  Ventilator dependent respiratory failure, acute encephalopathy. EXAM: PORTABLE CHEST 1 VIEW COMPARISON:  Portable chest x-ray of July 12, 2015, July 04, 2015, and [place] and lateral chest of February 20, 2014. FINDINGS: The lungs are adequately inflated. There is no focal infiltrate. There is stable nodule projecting over the posterior aspect of the right eighth rib. The heart is top-normal in size. The pulmonary vascularity is normal. There is no pleural effusion or pneumothorax. The endotracheal tube tip lies 4.9 cm above the carina. The esophagogastric tube tip projects below the inferior margin of the image. The right-sided PICC line tip projects over the midportion of the SVC. IMPRESSION: There is no evidence of pneumonia. Minimal atelectasis suspected at both lung bases less conspicuous today. The support tubes are in reasonable  position. Persistent nodularity projecting over the posterior aspect of the right eighth rib. Electronically Signed   By: David  Martinique M.D.   On: 07/13/2015 08:14   Dg Chest Port 1 View  07/12/2015  CLINICAL DATA:  Acute respiratory failure. EXAM: PORTABLE CHEST 1 VIEW COMPARISON:  07/11/2015. FINDINGS: Endotracheal  tube and NG tube noted in good anatomic position. Mediastinum hilar structures normal. Low lung volumes with mild bibasilar subsegmental atelectasis and/or infiltrates slightly progressed from prior exam. Heart size normal. No pleural effusion or pneumothorax . IMPRESSION: 1. Lines and tubes in stable position. 2. Low lung volumes with mild bibasilar subsegmental atelectasis and/or infiltrates with slight progression from prior exam . Electronically Signed   By: [place]   On: 07/12/2015 07:58   Dg Chest Port 1 View  07/11/2015  CLINICAL DATA:  Endotracheal tube placement EXAM: PORTABLE CHEST 1 VIEW COMPARISON:  Portable exam 1610 hours compared to 07/10/2015 FINDINGS: Tip of endotracheal tube projects 6.8 cm above carina. Nasogastric tube extends into stomach. Normal heart size, mediastinal contours and pulmonary vascularity for technique and slight rotation. Minimal bibasilar atelectasis greater on RIGHT. Lungs otherwise clear. No pleural effusion or pneumothorax. IMPRESSION: Satisfactory endotracheal tube position. Minimal bibasilar atelectasis greater on RIGHT. Electronically Signed   By: Lavonia Dana M.D.   On: 07/11/2015 16:18   Dg Chest Port 1 View  07/10/2015  CLINICAL DATA:  Post intubation with history of hypertension EXAM: PORTABLE CHEST 1 VIEW COMPARISON:  07/10/2015 FINDINGS: Endotracheal tube tip is 5.8 cm above the carina. Tip dislocated above the clavicle level. Lungs are clear. Mild cardiac enlargement stable. IMPRESSION: Endotracheal tube as described Electronically Signed   By: Skipper Cliche M.D.   On: 07/10/2015 16:27   Dg Chest Port 1 View  07/10/2015  CLINICAL  DATA:  Loss of consciousness.  Possible aspiration. EXAM: PORTABLE CHEST 1 VIEW COMPARISON:  Chest radiograph 07/04/2015. FINDINGS: Patient is rotated. Stable cardiac and mediastinal contours. Low lung volumes. No consolidative pulmonary opacities. No pleural effusion or pneumothorax. Regional skeleton is unremarkable. IMPRESSION: No acute cardiopulmonary process. Electronically Signed   By: Lovey Newcomer M.D.   On: 07/10/2015 10:48   Dg Fluoro Guide Lumbar Puncture  07/11/2015  CLINICAL DATA:  55 year old female with history of multiple sclerosis. Altered mental status. EXAM: DIAGNOSTIC LUMBAR PUNCTURE UNDER FLUOROSCOPIC GUIDANCE FLUOROSCOPY TIME:  Radiation Exposure Index (as provided by the fluoroscopic device): 37.34 micro Gray PROCEDURE: Witnessed informed consent was obtained from the patient's sister prior to the procedure (by phone as patient not able to give consent and patient is daughter is not available), including potential complications of headache, bleeding, allergy, and pain. With the patient prone and on a ventilator, the lower back was prepped with Betadine. 1% Lidocaine was used for local anesthesia. Lumbar puncture was performed at the L2-3 level with a single pass of a 22 gauge needle with return of clear CSF with an opening pressure of 11 cm water. Nine ml of CSF were obtained for laboratory studies. The patient tolerated the procedure well and there were no apparent complications. IMPRESSION: Successful fluoroscopic guided L2-3 lumbar puncture with collection of 9 cc of clear cerebral spinal fluid which was sent for labs as per request. Electronically Signed   By: Genia Del M.D.   On: 07/11/2015 15:59    CBC  Recent Labs Lab 07/19/15 0454 07/20/15 0503 07/21/15 0450 07/22/15 0540 07/23/15 0450  WBC 11.4* 10.4 9.4 6.0 7.9  HGB 8.7* 8.2* 7.8* 8.1* 8.7*  HCT 25.1* 24.4* 23.0* 25.0* 25.5*  PLT 175 187 219 251 304  MCV 79.7 81.1 78.2 80.4 79.4  MCH 27.6 27.2 26.5 26.0 27.1    MCHC 34.7 33.6 33.9 32.4 34.1  RDW 16.4* 16.6* 16.4* 16.6* 16.8*    Chemistries   Recent Labs Lab 07/19/15 0454 07/20/15  0503 07/21/15 0450 07/22/15 0540 07/23/15 0450  NA 140 141 135 142 142  K 3.2* 3.1* 3.0* 3.2* 4.9  CL 106 108 105 110 109  CO2 27 25 23 24 25   GLUCOSE 145* 107* 296* 118* 135*  BUN 15 7 <5* 5* 7  CREATININE 0.56 0.54 0.59 0.64 0.67  CALCIUM 8.7* 8.6* 8.4* 8.3* 9.1  MG 1.8 1.8 1.7 1.6* 2.2   ------------------------------------------------------------------------------------------------------------------ estimated creatinine clearance is 85.2 mL/min (by C-G formula based on Cr of 0.67). ------------------------------------------------------------------------------------------------------------------ No results for input(s): HGBA1C in the last 72 hours. ------------------------------------------------------------------------------------------------------------------ No results for input(s): CHOL, HDL, LDLCALC, TRIG, CHOLHDL, LDLDIRECT in the last 72 hours. ------------------------------------------------------------------------------------------------------------------ No results for input(s): TSH, T4TOTAL, T3FREE, THYROIDAB in the last 72 hours.  Invalid input(s): FREET3 ------------------------------------------------------------------------------------------------------------------ No results for input(s): VITAMINB12, FOLATE, FERRITIN, TIBC, IRON, RETICCTPCT in the last 72 hours.  Coagulation profile No results for input(s): INR, PROTIME in the last 168 hours.  No results for input(s): DDIMER in the last 72 hours.  Cardiac Enzymes No results for input(s): CKMB, TROPONINI, MYOGLOBIN in the last 168 hours.  Invalid input(s): CK ------------------------------------------------------------------------------------------------------------------ Invalid input(s): POCBNP   Recent Labs  07/21/15 1551 07/24/15 0648  GLUCAP 132* 145*      Anik Wesch M.D. [facility] 07/24/2015, 11:39 AM  Pager: [phone] Between 7am to 7pm - call Pager - [phone]  After 7pm go to [email or web address] - password [email or web address]  Call night coverage person covering after 7pm

## 2015-07-25 LAB — GLUCOSE, CAPILLARY
GLUCOSE-CAPILLARY: 141 mg/dL — AB (ref 65–99)
GLUCOSE-CAPILLARY: 103 mg/dL — AB (ref 65–99)

## 2015-07-25 MED ORDER — FUROSEMIDE 80 MG PO TABS
80.0000 mg | ORAL_TABLET | Freq: Every day | ORAL | Status: DC
Start: 2015-07-25 — End: 2015-07-25
  Administered 2015-07-25: 80 mg via ORAL

## 2015-07-25 MED ORDER — FAMOTIDINE 20 MG PO TABS: 20.0000 mg | ORAL_TABLET | Freq: Two times a day (BID) | ORAL | Status: DC

## 2015-07-25 MED ORDER — POLYETHYLENE GLYCOL 3350 17 G PO PACK: 17.0000 g | PACK | Freq: Every day | ORAL | Status: DC | PRN

## 2015-07-25 MED ORDER — BACLOFEN 10 MG PO TABS: 5.0000 mg | ORAL_TABLET | Freq: Three times a day (TID) | ORAL | Status: DC

## 2015-07-25 MED ORDER — FUROSEMIDE 80 MG PO TABS: 80.0000 mg | ORAL_TABLET | Freq: Every day | ORAL | Status: DC

## 2015-07-25 MED ORDER — PREDNISONE 10 MG PO TABS: 10.0000 mg | ORAL_TABLET | Freq: Every day | ORAL | Status: DC

## 2015-07-25 MED ORDER — SODIUM CHLORIDE 0.9 % IJ SOLN: 10.0000 mL | INTRAMUSCULAR | Status: DC | PRN

## 2015-07-25 MED ORDER — CARVEDILOL 25 MG PO TABS: 25.0000 mg | ORAL_TABLET | Freq: Two times a day (BID) | ORAL | Status: DC

## 2015-07-25 NOTE — Discharge Summary (Signed)
Physician Discharge Summary   Patient ID: Shannon Garrett MRN: [id number] DOB/AGE: [birth year] 53 y.o.  Admit date: 07/10/2015 Discharge date: 07/25/2015  Primary Care Physician:  Hennie Duos, MD  Discharge Diagnoses:    . Acute encephalopathy likely due to polypharmacy Acute respiratory failure with hypoxia requiring intubation Dysphagia, currently on dysphagia 2 diet Acute kidney injury Hypertension Hypernatremia Hypomagnesemia, hypokalemia Severe multiple sclerosis Possible adrenal insufficiency status post recent infection  Consults:   Patient was admitted by critical care service Neurology  Recommendations for Outpatient Follow-up:  1. Please note the medication changes 2. Please repeat CBC/BMET at next visit 3. Prednisone 10 mg daily for 3 days then off, recheck ACTH stim test outpatient 4. Baclofen decreased to 5 mg 3 times a day 5. Lasix decreased to 80 mg daily 6. Coreg increased to 25 mg twice a day 7. Please note Neurontin, ciprofloxacin, narcotics, imipramine Cymbalta discontinued   DIET: Dysphagia 2 diet with thin liquids    Allergies:   Allergies  Allergen Reactions  . Sulfonamide Derivatives Hives and Shortness Of Breath  . Penicillins Hives and Itching    Has patient had a PCN reaction causing immediate rash, facial/tongue/throat swelling, SOB or lightheadedness with hypotension: unknown Has patient had a PCN reaction causing severe rash involving mucus membranes or skin necrosis: unknown Has patient had a PCN reaction that required hospitalization: unknown Has patient had a PCN reaction occurring within the last 10 years: unknown If all of the above answers are "NO", then may proceed with Cephalosporin use. Prior course of rocephin charted 05/2015     DISCHARGE MEDICATIONS: Current Discharge Medication List    START taking these medications   Details  famotidine (PEPCID) 20 MG tablet Take 1 tablet (20 mg total) by mouth 2 (two) times daily.     predniSONE (DELTASONE) 10 MG tablet Take 1 tablet (10 mg total) by mouth daily with breakfast. X 3days, then off      CONTINUE these medications which have CHANGED   Details  baclofen (LIORESAL) 10 MG tablet Take 0.5 tablets (5 mg total) by mouth 3 (three) times daily. Qty: 30 each, Refills: 0    carvedilol (COREG) 25 MG tablet Take 1 tablet (25 mg total) by mouth 2 (two) times daily with a meal.    furosemide (LASIX) 80 MG tablet Take 1 tablet (80 mg total) by mouth daily. Qty: 60 tablet, Refills: 0    polyethylene glycol (MIRALAX / GLYCOLAX) packet Take 17 g by mouth daily as needed for mild constipation. Qty: 120 packet, Refills: 11   Associated Diagnoses: Chronic constipation      CONTINUE these medications which have NOT CHANGED   Details  amantadine (SYMMETREL) 100 MG capsule Take 100 mg by mouth daily.     Cholecalciferol (VITAMIN D-3 PO) Take 1 tablet by mouth daily.    CRANBERRY PO Take 100 mg by mouth daily.    Cyanocobalamin (VITAMIN B-12 PO) Take 1 tablet by mouth daily.    docusate sodium (COLACE) 100 MG capsule Take 100 mg by mouth 2 (two) times daily.    Propylene Glycol (SYSTANE BALANCE) 0.6 % SOLN Place 1 drop into both eyes 2 (two) times daily.    solifenacin (VESICARE) 10 MG tablet Take 10 mg by mouth daily. For bladder spasms    spironolactone (ALDACTONE) 25 MG tablet Take 25 mg by mouth daily. For CHF    tamsulosin (FLOMAX) 0.4 MG CAPS capsule Take 0.4 mg by mouth daily. For incontinence    acetaminophen (  TYLENOL) 325 MG tablet Take 2 tablets (650 mg total) by mouth every 6 (six) hours as needed for mild pain or fever (or Fever >/= 101). Qty: 30 tablet, Refills: 0    feeding supplement, ENSURE ENLIVE, (ENSURE ENLIVE) LIQD Take 237 mLs by mouth 2 (two) times daily as needed (Please provide to pt if she she eats less than 50% of breakfast and dinner). Qty: 237 mL, Refills: 12    latanoprost (XALATAN) 0.005 % ophthalmic solution Place 1 drop into  both eyes at bedtime.    magnesium hydroxide (MILK OF MAGNESIA) 400 MG/5ML suspension Take 30 mLs by mouth every 8 (eight) hours as needed for mild constipation or moderate constipation.    ondansetron (ZOFRAN) 4 MG tablet Take 1 tablet (4 mg total) by mouth every 6 (six) hours as needed for nausea. Qty: 20 tablet, Refills: 0      STOP taking these medications     ciprofloxacin (CIPRO) 500 MG tablet      DULoxetine (CYMBALTA) 60 MG capsule      imipramine (TOFRANIL) 50 MG tablet      oxyCODONE-acetaminophen (PERCOCET) 7.5-325 MG tablet      gabapentin (NEURONTIN) 300 MG capsule      oxyCODONE-acetaminophen (PERCOCET/ROXICET) 5-325 MG tablet      potassium chloride (KLOR-CON) 20 MEQ packet          Brief H and P: For complete details please refer to admission H and P, but in brief 55 y/o female with a history of MS and multiple admissions for foley catheter associated UTI admitted 07/09/2014 after being brought in by EMS from her SNF. She was discharged from [facility] on 1/6 after being treated for a UTI w/ Acinetobacter w/ on Cipro. The day of her admit she was found nearly unresponsive in her SNF.  Significant Events: 1/8 admitted - intubated  1/8 CT head > no acute changes 1/9 LP 1/9 MRI Brain > chronic MS changes but nothing acute 1/18 extubated   Hospital Course:   Acute encephalopathy- improving, close to baseline - Patient was admitted to the intensive care unit as she was intubated on the time of admission, extubated on 1/18. Neurology was consulted. Patient underwent extensive neurological testing including LP, MRI and EEG non-diagnostic. 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 EEG on 1/10 was abnormal with moderate severe generalized slowing of cerebral activity with triphasic discharges as well as rhythmic theta  activity. Patient was placed on Vimpat, Dilantin. Stress dose steroids were tapered. Repeat EEG was done on 1/12 which showed rare triphasic waves with mild slowing, consistent with nonspecific cerebral dysfunction/encephalopathy. Per neurology there has not been any definite evidence for seizure and hence AED's were titrated down and stopped. Patient remained stable and had no seizure activity after stopping AED's. If she were to relapse and then this may need to be revisited again. - Urine culture showed Acinetobacter, completed Zosyn 1/10-1/14, repeat urine culture negative so far. - Ammonia level normal, B12 normal, TSH normal - PTOT evaluation recommended skilled nursing facility - Per critical care, likely symptoms could be from polypharmacy, ciprofloxacin and suggested holding most of her medications. Baclofen is decreased, Neurontin, Cymbalta, narcotics, and imipramine has been placed on hold.  Acute respiratory failure with hypoxemia - Resolved, extubated on 1/18  - Passed a swallow testing, on dysphagia 2 diet   AKI uncertain etiology - resolved  with normal renal function at present  HTN Blood pressure currently recently controlled  Hypernatremia Resolved   Hypomagnesemia  Resolved  Hypokalemia Resolved  Coag neg staph 1 of 2 likely contaminant - no persisting symptoms to suggest infection  Possible adrenal insuff s/p recent infection Cortisol low at 1.4 1/10 - muted response on ACTH stim testing - taper steroids and retest in outpatient setting  Day of Discharge BP 143/73 mmHg  Pulse 76  Temp(Src) 98.3 F (36.8 C) (Oral)  Resp 18  Ht 5\' 2"  (1.575 m)  Wt 88.905 kg (196 lb)  BMI 35.84 kg/m2  SpO2 99%  Physical Exam: General: Alert and awake oriented, not in any acute distress. HEENT: anicteric sclera, pupils reactive to light and accommodation CVS: S1-S2 clear no murmur rubs or gallops Chest: clear to auscultation bilaterally, no wheezing rales or  rhonchi Abdomen: soft nontender, nondistended, normal bowel sounds Extremities: no cyanosis, clubbing or edema noted bilaterally    The results of significant diagnostics from this hospitalization (including imaging, microbiology, ancillary and laboratory) are listed below for reference.    LAB RESULTS: Basic Metabolic Panel:  Recent Labs Lab 07/22/15 0540 07/23/15 0450  NA 142 142  K 3.2* 4.9  CL 110 109  CO2 24 25  GLUCOSE 118* 135*  BUN 5* 7  CREATININE 0.64 0.67  CALCIUM 8.3* 9.1  MG 1.6* 2.2  PHOS 2.9  --    Liver Function Tests: No results for input(s): AST, ALT, ALKPHOS, BILITOT, PROT, ALBUMIN in the last 168 hours. No results for input(s): LIPASE, AMYLASE in the last 168 hours. No results for input(s): AMMONIA in the last 168 hours. CBC:  Recent Labs Lab 07/22/15 0540 07/23/15 0450  WBC 6.0 7.9  HGB 8.1* 8.7*  HCT 25.0* 25.5*  MCV 80.4 79.4  PLT 251 304   Cardiac Enzymes: No results for input(s): CKTOTAL, CKMB, CKMBINDEX, TROPONINI in the last 168 hours. BNP: Invalid input(s): POCBNP CBG:  Recent Labs Lab 07/25/15 0659 07/25/15 1130  GLUCAP 141* 103*    Significant Diagnostic Studies:  Ct Head Wo Contrast  07/10/2015  CLINICAL DATA:  Patient with possible aspiration. Possible change in mental status. EXAM: CT HEAD WITHOUT CONTRAST TECHNIQUE: Contiguous axial images were obtained from the base of the skull through the vertex without intravenous contrast. COMPARISON:  Brain CT 07/04/2015. FINDINGS: Ventricles and sulci are prominent compatible with atrophy. Periventricular and subcortical white matter hypodensity compatible with chronic white matter disease. No evidence for acute cortically based infarct, intracranial hemorrhage, mass lesion or mass-effect. Orbits are unremarkable. Mucosal thickening within the ethmoid air cells and frontal sinus. Mastoid air cells are unremarkable. Calvarium is intact. IMPRESSION: No acute intracranial process. Atrophy  and chronic white matter disease. Electronically Signed   By: Lovey Newcomer M.D.   On: 07/10/2015 12:16   Mr Jeri Cos [id number] Contrast  07/12/2015  ADDENDUM REPORT: 07/12/2015 08:29 ADDENDUM: Progressive opacification left petrous apex without surrounding abnormal enhancement. No obstructing lesion of the eustachian tube identified. Electronically Signed   By: Genia Del M.D.   On: 07/12/2015 08:29  07/12/2015  CLINICAL DATA:  55 year old hypertensive female with multiple sclerosis and mental status changes. Subsequent encounter. EXAM: MRI HEAD WITHOUT AND WITH CONTRAST TECHNIQUE: Multiplanar, multiecho pulse sequences of the brain and surrounding structures were obtained without and with intravenous contrast. CONTRAST:  19 cc MultiHance. COMPARISON:  07/10/2015 head CT. 06/08/2015, 10/08/2014 and 09/04/2012 brain MR. FINDINGS: Exam is motion degraded. No acute infarct or intracranial hemorrhage. Prominent white matter  changes consistent with patient's history multiple sclerosis. No significant change from prior exams. No interval growth to suggest progressive multifocal leukoencephalopathy. No restricted motion or enhancement to suggest active demyelination. 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 Infundibulum remains deviated to the left. No other intracranial mass or abnormal enhancement. Nonspecific 1 cm right parotid lesion unchanged. Global atrophy without hydrocephalus. Mild exophthalmos. Minimal mucosal thickening ethmoid sinus air cells. Major intracranial vascular structures are patent. IMPRESSION: Exam is motion degraded. No acute infarct or intracranial hemorrhage. Prominent white matter changes consistent with patient's history of multiple sclerosis. No significant change from prior exams. No interval growth to suggest progressive multifocal leukoencephalopathy. No restricted motion or enhancement to suggest active demyelination. 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 Infundibulum remains deviated to the left. No other intracranial mass or abnormal enhancement. Global atrophy without hydrocephalus. Nonspecific 1 cm right parotid lesion unchanged. Electronically Signed: By: Genia Del M.D. On: 07/11/2015 14:51   Dg Chest Port 1 View  07/11/2015  CLINICAL DATA:  Endotracheal tube placement EXAM: PORTABLE CHEST 1 VIEW COMPARISON:  Portable exam 1610 hours compared to 07/10/2015 FINDINGS: Tip of endotracheal tube projects 6.8 cm above carina. Nasogastric tube extends into stomach. Normal heart size, mediastinal contours and pulmonary vascularity for technique and slight rotation. Minimal bibasilar atelectasis greater on RIGHT. Lungs otherwise clear. No pleural effusion or pneumothorax. IMPRESSION: Satisfactory endotracheal tube position. Minimal bibasilar atelectasis greater on RIGHT. Electronically Signed   By: Lavonia Dana M.D.   On: 07/11/2015 16:18   Dg Chest Port 1 View  07/10/2015  CLINICAL DATA:  Post intubation with history of hypertension EXAM: PORTABLE CHEST 1 VIEW COMPARISON:  07/10/2015 FINDINGS: Endotracheal tube tip is 5.8 cm above the carina. Tip dislocated above the clavicle level. Lungs are clear. Mild cardiac enlargement stable. IMPRESSION: Endotracheal tube as described Electronically Signed   By: Skipper Cliche M.D.   On: 07/10/2015 16:27   Dg Chest Port 1 View  07/10/2015  CLINICAL DATA:  Loss of consciousness.  Possible aspiration. EXAM: PORTABLE CHEST 1 VIEW COMPARISON:  Chest radiograph 07/04/2015. FINDINGS: Patient is rotated. Stable cardiac and mediastinal contours. Low lung volumes. No consolidative pulmonary opacities. No pleural effusion or pneumothorax. Regional skeleton is unremarkable. IMPRESSION: No acute cardiopulmonary process. Electronically Signed   By: Lovey Newcomer M.D.   On: 07/10/2015 10:48   Dg Fluoro Guide Lumbar Puncture  07/11/2015  CLINICAL DATA:  55 year old female with history of multiple sclerosis. Altered mental  status. EXAM: DIAGNOSTIC LUMBAR PUNCTURE UNDER FLUOROSCOPIC GUIDANCE FLUOROSCOPY TIME:  Radiation Exposure Index (as provided by the fluoroscopic device): 37.34 micro Gray PROCEDURE: Witnessed informed consent was obtained from the patient's sister prior to the procedure (by phone as patient not able to give consent and patient is daughter is not available), including potential complications of headache, bleeding, allergy, and pain. With the patient prone and on a ventilator, the lower back was prepped with Betadine. 1% Lidocaine was used for local anesthesia. Lumbar puncture was performed at the L2-3 level with a single pass of a 22 gauge needle with return of clear CSF with an opening pressure of 11 cm water. Nine ml of CSF were obtained for laboratory studies. The patient tolerated the procedure well and there were no apparent complications. IMPRESSION: Successful fluoroscopic guided L2-3 lumbar puncture with collection of 9 cc of clear cerebral spinal fluid which was sent for labs as per request. Electronically Signed   By: Remo Lipps  Jeannine Kitten M.D.   On: 07/11/2015 15:59    2D ECHO:   Disposition and Follow-up: Discharge Instructions    Discharge instructions    Complete by:  As directed   Diet: Dysphagia 2 with thin liquids     Increase activity slowly    Complete by:  As directed             DISPOSITION: Skilled nursing facility   DISCHARGE FOLLOW-UP Follow-up Information    Follow up with SATER,RICHARD A, MD. Schedule an appointment as soon as possible for a visit in 10 days.   Specialty:  Neurology   Why:  for hospital follow-up   Contact information:   [address] [place] [address] [phone]       Follow up with Hennie Duos, MD. Schedule an appointment as soon as possible for a visit in 1 week.   Specialty:  Internal Medicine   Why:  for hospital follow-up   Contact information:   [place] [id number] [phone]        Time spent on  Discharge: 40 minutes  Signed:   Bralin Garry M.D. [facility] 07/25/2015, 12:42 PM Pager: [phone]

## 2015-07-25 NOTE — Care Management Important Message (Signed)
Important Message  Patient Details  Name: Shannon Garrett MRN: [id number] Date of Birth: 1960-09-22   Medicare Important Message Given:  Yes    [name] May 07/25/2015, 3:44 PM

## 2015-07-25 NOTE — Progress Notes (Signed)
Patient will be transferred back to [facility]. Facility has been informed that patient is medically stable and ready for discharge. Per facility representative, patient can arrive to facility at anytime. CSW informed patient and RN of transfer time. Patient was appreciative of CSW services.   Patient to be transported via [facility] at 5:30pm. D/C Summary sent via Hub system. No further needs were requested at this time. CSW to sign off.   Please re-consult if further CSW needs arise.   Lucius Conn, [place] Worker [facility] Ph: [phone]

## 2015-07-25 NOTE — Progress Notes (Signed)
Patient is discharged from room 5C20 at this time. Alert and in stable condition. Attempts made twice to give report to receiving facility without any success. Phone kept ringing after being transferred. Transported out of facility via stretcher by PTAR with all belongings at side.

## 2015-07-26 ENCOUNTER — Non-Acute Institutional Stay (SKILLED_NURSING_FACILITY): Payer: Medicare Other | Admitting: Adult Health

## 2015-07-26 DIAGNOSIS — G894 Chronic pain syndrome: Secondary | ICD-10-CM

## 2015-07-26 DIAGNOSIS — I110 Hypertensive heart disease with heart failure: Secondary | ICD-10-CM | POA: Diagnosis not present

## 2015-07-26 DIAGNOSIS — I5042 Chronic combined systolic (congestive) and diastolic (congestive) heart failure: Secondary | ICD-10-CM

## 2015-07-26 DIAGNOSIS — G35 Multiple sclerosis: Secondary | ICD-10-CM | POA: Diagnosis not present

## 2015-07-27 ENCOUNTER — Non-Acute Institutional Stay (SKILLED_NURSING_FACILITY): Payer: Medicare Other | Admitting: Internal Medicine

## 2015-07-27 DIAGNOSIS — N179 Acute kidney failure, unspecified: Secondary | ICD-10-CM

## 2015-07-27 DIAGNOSIS — E271 Primary adrenocortical insufficiency: Secondary | ICD-10-CM | POA: Diagnosis not present

## 2015-07-27 DIAGNOSIS — I110 Hypertensive heart disease with heart failure: Secondary | ICD-10-CM | POA: Diagnosis not present

## 2015-07-27 DIAGNOSIS — N319 Neuromuscular dysfunction of bladder, unspecified: Secondary | ICD-10-CM | POA: Diagnosis not present

## 2015-07-27 DIAGNOSIS — G9340 Encephalopathy, unspecified: Secondary | ICD-10-CM | POA: Diagnosis not present

## 2015-07-27 DIAGNOSIS — J9601 Acute respiratory failure with hypoxia: Secondary | ICD-10-CM | POA: Diagnosis not present

## 2015-07-27 DIAGNOSIS — I5042 Chronic combined systolic (congestive) and diastolic (congestive) heart failure: Secondary | ICD-10-CM

## 2015-07-27 DIAGNOSIS — G35 Multiple sclerosis: Secondary | ICD-10-CM | POA: Diagnosis not present

## 2015-07-28 ENCOUNTER — Telehealth: Payer: Self-pay | Admitting: Neurology

## 2015-07-28 NOTE — Telephone Encounter (Signed)
I have spoken with Moji and advised that [facility] has not taken pt. off of Tysabri.  She is in a nsg. facility, has missed her last 2 infusions.  Tina (infusion nurse) is reaching out to her to r/s/fim

## 2015-07-28 NOTE — Telephone Encounter (Signed)
Moji with [facility] x [phone] calling inquiring if provider has taken pt off tysabri.

## 2015-08-01 ENCOUNTER — Encounter: Payer: Self-pay | Admitting: Internal Medicine

## 2015-08-01 NOTE — Progress Notes (Signed)
MRN: [id number] Name: Shannon Garrett  Sex: female Age: 55 y.o. DOB: 22-May-1961  [place] #: Karren Burly Facility/Room:230 Level Of Care: SNF Provider: Inocencio Homes D Emergency Contacts: Extended Emergency Contact Information Primary Emergency Contact: [place], [place] [address] [place] Phone: [phone] Relation: Sister Secondary Emergency Contact: Jefm Bryant, [place] Phone: [phone] Mobile Phone: [phone] Relation: Friend  Code Status:   Allergies: Sulfonamide derivatives and Penicillins  Chief Complaint  Patient presents with  . Readmit To SNF    HPI: Patient is 55 y.o. female with history of MS and multiple admissions for foley catheter associated UTI admitted 07/09/2014 after being brought in by EMS from her SNF. She was discharged from [facility] on 1/6 after being treated for a UTI w/ Acinetobacter w/ on Cipro. The day of her admit she was found nearly unresponsive in her SNF. Pt was admitted to [facility] from 1/8-23 where she was treated foracute encephalopathy and acute respiratory failure requiring intubation and ICU.She had no infections this hospitalization. She is admitted to SNF for residential care. While at SNF pt will be followed for HTN, tx with lasix, spironolactone and coreg; CHF, tx with lasix and coreg and spironolactone and neurogenic bladder tx with flomax and vesicare.  Past Medical History  Diagnosis Date  . MS (multiple sclerosis) ([place])     a. Dx'd late 20's. b. Tx with Novantrone, Tysabri, Copaxone previously.  Marland Kitchen Hypertension   . Depression   . Glaucoma   . Cardiomyopathy ([place])     a.  Echo 04/29/12: Mild LVH, EF 20-25%, mild AI, moderate MR, moderate LAE, mild RAE, mild RVE, moderate TR, PASP 51, small pericardial effusion;   b. probably non-ischemic given multiple chemo-Tx agents used for MS and global LV dysfn on echo  . Chronic systolic heart failure  [facility])     Past Surgical History  Procedure Laterality Date  . Ablation      uterine  . Cesarean section        Medication List       This list is accurate as of: 07/27/15 11:59 PM.  Always use your most recent med list.               acetaminophen 325 MG tablet  Commonly known as:  TYLENOL  Take 2 tablets (650 mg total) by mouth every 6 (six) hours as needed for mild pain or fever (or Fever >/= 101).     amantadine 100 MG capsule  Commonly known as:  SYMMETREL  Take 100 mg by mouth daily.     baclofen 10 MG tablet  Commonly known as:  LIORESAL  Take 0.5 tablets (5 mg total) by mouth 3 (three) times daily.     carvedilol 25 MG tablet  Commonly known as:  COREG  Take 1 tablet (25 mg total) by mouth 2 (two) times daily with a meal.     CRANBERRY PO  Take 100 mg by mouth daily.     docusate sodium 100 MG capsule  Commonly known as:  COLACE  Take 100 mg by mouth 2 (two) times daily.     famotidine 20 MG tablet  Commonly known as:  PEPCID  Take 1 tablet (20 mg total) by mouth 2 (two) times daily.     feeding supplement (ENSURE ENLIVE) Liqd  Take 237 mLs by mouth 2 (two)  times daily as needed (Please provide to pt if she she eats less than 50% of breakfast and dinner).     furosemide 80 MG tablet  Commonly known as:  LASIX  Take 1 tablet (80 mg total) by mouth daily.     magnesium hydroxide 400 MG/5ML suspension  Commonly known as:  MILK OF MAGNESIA  Take 30 mLs by mouth every 8 (eight) hours as needed for mild constipation or moderate constipation.     ondansetron 4 MG tablet  Commonly known as:  ZOFRAN  Take 1 tablet (4 mg total) by mouth every 6 (six) hours as needed for nausea.     polyethylene glycol packet  Commonly known as:  MIRALAX / GLYCOLAX  Take 17 g by mouth daily as needed for mild constipation.     predniSONE 10 MG tablet  Commonly known as:  DELTASONE  Take 1 tablet (10 mg total) by mouth daily with breakfast. X 3days, then off      solifenacin 10 MG tablet  Commonly known as:  VESICARE  Take 10 mg by mouth daily. For bladder spasms     spironolactone 25 MG tablet  Commonly known as:  ALDACTONE  Take 25 mg by mouth daily. For CHF     SYSTANE BALANCE 0.6 % Soln  Generic drug:  Propylene Glycol  Place 1 drop into both eyes 2 (two) times daily.     tamsulosin 0.4 MG Caps capsule  Commonly known as:  FLOMAX  Take 0.4 mg by mouth daily. For incontinence     VITAMIN B-12 PO  Take 1 tablet by mouth daily.     VITAMIN D-3 PO  Take 1 tablet by mouth daily.     XALATAN 0.005 % ophthalmic solution  Generic drug:  latanoprost  Place 1 drop into both eyes at bedtime.        No orders of the defined types were placed in this encounter.    Immunization History  Administered Date(s) Administered  . Influenza,inj,Quad PF,36+ Mos 08/01/2013  . Influenza-Unspecified 08/31/2014  . Pneumococcal Polysaccharide-23 08/01/2013  . Pneumococcal-Unspecified 08/30/2013, 07/29/2014    Social History  Substance Use Topics  . Smoking status: Former Research scientist (life sciences)  . Smokeless tobacco: Never Used  . Alcohol Use: No    Family history is + MS, HTN, CA  Review of Systems  DATA OBTAINED: from patient, nurse GENERAL:  no fevers,+ fatigue, appetite changes SKIN: No itching, rash or wounds EYES: No eye pain, redness, discharge EARS: No earache, tinnitus, change in hearing NOSE: No congestion, drainage or bleeding  MOUTH/THROAT: No mouth or tooth pain, No sore throat RESPIRATORY: No cough, wheezing, SOB CARDIAC: No chest pain, palpitations, lower extremity edema  GI: No abdominal pain, No N/V/D or constipation, No heartburn or reflux  GU: No dysuria, frequency or urgency, or incontinence  MUSCULOSKELETAL: No unrelieved bone/joint pain NEUROLOGIC: No headache, dizziness or new focal weakness PSYCHIATRIC: tired and feeling ready to go  Filed Vitals:   08/01/15 1116  BP: 121/62  Pulse: 89  Temp: 97.2 F (36.2 C)  Resp: 16     SpO2 Readings from Last 1 Encounters:  08/01/15 99%        Physical Exam  GENERAL APPEARANCE: Alert, conversant,  No acute distress.  SKIN: No diaphoresis rash HEAD: Normocephalic, atraumatic  EYES: Conjunctiva/lids clear. Pupils round, reactive. EOMs intact.  EARS: External exam WNL, canals clear. Hearing grossly normal.  NOSE: No deformity or discharge.  MOUTH/THROAT: Lips w/o lesions  RESPIRATORY: Breathing  is even, unlabored. Lung sounds are clear   CARDIOVASCULAR: Heart RRR no murmurs, rubs or gallops. No peripheral edema.   GASTROINTESTINAL: Abdomen is soft, non-tender, not distended w/ normal bowel sounds. GENITOURINARY: Bladder non tender, not distended  MUSCULOSKELETAL: wasting and contractures NEUROLOGIC:  Cranial nerves 2-12 grossly intact; quadriplegia but some movement of BUE PSYCHIATRIC: depressed, no behavioral issues  Patient Active Problem List   Diagnosis Date Noted  . Adrenal insufficiency (Addison's disease) ([place]) 08/12/2015  . Altered mental status   . Acute respiratory failure ([place])   . Gram-negative infection 07/06/2015  . Sepsis ([place]) 07/04/2015  . Vitamin D deficiency 06/18/2015  . Multiple sclerosis exacerbation ([place]) 06/08/2015  . Weakness 06/08/2015  . Chronic systolic (congestive) heart failure ([place]) 06/08/2015  . Acute encephalopathy 06/03/2015  . Acute kidney injury ([place]) 06/03/2015  . Hypokalemia 06/03/2015  . Sepsis secondary to UTI ([place]) 05/30/2015  . Hypernatremia 05/30/2015  . Dehydration 05/30/2015  . Chronic anemia 05/30/2015  . Chronic pain syndrome 03/03/2015  . Benign hypertensive heart disease without heart failure 01/18/2015  . Urinary tract infection due to Enterococcus   . Essential hypertension   . Abnormal LFTs   . Foot drop, bilateral   . Pressure ulcer 01/12/2015  . UTI (lower urinary tract infection) 01/11/2015  . Hypovolemia 01/11/2015  . UTI (urinary tract infection), uncomplicated   . Cardiomyopathy ([place])   .  Urinary incontinence 01/07/2015  . Chronic constipation 11/26/2014  . Depression 11/26/2014  . Tachycardia 02/20/2014  . Venous stasis ulcers ([place]) 02/20/2014  . Leg wound, left 11/04/2013  . FTT (failure to thrive) in adult 07/31/2013  . Medically noncompliant 07/31/2013  . Blisters of multiple sites 07/31/2013  . Generalized weakness 06/03/2013  . Insomnia 06/03/2013  . Neurogenic bladder 06/03/2013  . DS (disseminated sclerosis) ([place]) 02/12/2013  . Chronic diastolic heart failure ([place]) 09/29/2012  . Elevated CK 09/10/2012  . Severe malnutrition ([place]) 09/05/2012  . Paraplegia ([place]) 09/05/2012  . Fall 09/04/2012  . Chronic combined systolic and diastolic congestive heart failure ([place]) 05/22/2012  . Other primary cardiomyopathies 05/01/2012  . Immobility 04/28/2012  . OBESITY 11/30/2008  . ANEMIA, CHRONIC 11/30/2008  . MULTIPLE SCLEROSIS, PROGRESSIVE/RELAPSING 11/30/2008  . Hypertensive heart disease with CHF (congestive heart failure) ([place]) 11/30/2008  . ECZEMA 11/30/2008    CBC    Component Value Date/Time   WBC 7.9 07/23/2015 0450   WBC 6.2 05/18/2015 1626   RBC 3.21* 07/23/2015 0450   RBC 3.55* 05/18/2015 1626   RBC 3.70* 01/12/2015 2111   HGB 8.7* 07/23/2015 0450   HCT 25.5* 07/23/2015 0450   HCT 29.6* 05/18/2015 1626   PLT 304 07/23/2015 0450   PLT 222 05/18/2015 1626   MCV 79.4 07/23/2015 0450   MCV 83 05/18/2015 1626   LYMPHSABS 1.5 07/10/2015 1124   LYMPHSABS 2.1 05/18/2015 1626   MONOABS 0.5 07/10/2015 1124   EOSABS 0.2 07/10/2015 1124   EOSABS 0.5* 05/18/2015 1626   BASOSABS 0.1 07/10/2015 1124   BASOSABS 0.1 05/18/2015 1626    CMP     Component Value Date/Time   NA 142 07/23/2015 0450   NA 142 09/28/2014   K 4.9 07/23/2015 0450   CL 109 07/23/2015 0450   CO2 25 07/23/2015 0450   GLUCOSE 135* 07/23/2015 0450   BUN 7 07/23/2015 0450   BUN 16 09/28/2014   CREATININE 0.67 07/23/2015 0450   CALCIUM 9.1 07/23/2015 0450   PROT 7.5 07/10/2015 1507    ALBUMIN 3.6 07/10/2015 1507  AST 17 07/10/2015 1507   ALT 31 07/10/2015 1507   ALKPHOS 166* 07/10/2015 1507   BILITOT 0.6 07/10/2015 1507   GFRNONAA >60 07/23/2015 0450   GFRAA >60 07/23/2015 0450    Lab Results  Component Value Date   HGBA1C 5.3 01/11/2015     Ct Head Wo Contrast  07/10/2015  CLINICAL DATA:  Patient with possible aspiration. Possible change in mental status. EXAM: CT HEAD WITHOUT CONTRAST TECHNIQUE: Contiguous axial images were obtained from the base of the skull through the vertex without intravenous contrast. COMPARISON:  Brain CT 07/04/2015. FINDINGS: Ventricles and sulci are prominent compatible with atrophy. Periventricular and subcortical white matter hypodensity compatible with chronic white matter disease. No evidence for acute cortically based infarct, intracranial hemorrhage, mass lesion or mass-effect. Orbits are unremarkable. Mucosal thickening within the ethmoid air cells and frontal sinus. Mastoid air cells are unremarkable. Calvarium is intact. IMPRESSION: No acute intracranial process. Atrophy and chronic white matter disease. Electronically Signed   By: Lovey Newcomer M.D.   On: 07/10/2015 12:16   Mr Jeri Cos [id number] Contrast  07/12/2015  ADDENDUM REPORT: 07/12/2015 08:29 ADDENDUM: Progressive opacification left petrous apex without surrounding abnormal enhancement. No obstructing lesion of the eustachian tube identified. Electronically Signed   By: Genia Del M.D.   On: 07/12/2015 08:29  07/12/2015  CLINICAL DATA:  55 year old hypertensive female with multiple sclerosis and mental status changes. Subsequent encounter. EXAM: MRI HEAD WITHOUT AND WITH CONTRAST TECHNIQUE: Multiplanar, multiecho pulse sequences of the brain and surrounding structures were obtained without and with intravenous contrast. CONTRAST:  19 cc MultiHance. COMPARISON:  07/10/2015 head CT. 06/08/2015, 10/08/2014 and 09/04/2012 brain MR. FINDINGS: Exam is motion degraded. No acute infarct or  intracranial hemorrhage. Prominent white matter changes consistent with patient's history multiple sclerosis. No significant change from prior exams. No interval growth to suggest progressive multifocal leukoencephalopathy. No restricted motion or enhancement to suggest active demyelination. 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 Infundibulum remains deviated to the left. No other intracranial mass or abnormal enhancement. Nonspecific 1 cm right parotid lesion unchanged. Global atrophy without hydrocephalus. Mild exophthalmos. Minimal mucosal thickening ethmoid sinus air cells. Major intracranial vascular structures are patent. IMPRESSION: Exam is motion degraded. No acute infarct or intracranial hemorrhage. Prominent white matter changes consistent with patient's history of multiple sclerosis. No significant change from prior exams. No interval growth to suggest progressive multifocal leukoencephalopathy. No restricted motion or enhancement to suggest active demyelination. 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 Infundibulum remains deviated to the left. No other intracranial mass or abnormal enhancement. Global atrophy without hydrocephalus. Nonspecific 1 cm right parotid lesion unchanged. Electronically Signed: By: Genia Del M.D. On: 07/11/2015 14:51   Dg Chest Port 1 View  07/11/2015  CLINICAL DATA:  Endotracheal tube placement EXAM: PORTABLE CHEST 1 VIEW COMPARISON:  Portable exam 1610 hours compared to 07/10/2015 FINDINGS: Tip of endotracheal tube projects 6.8 cm above carina. Nasogastric tube extends into stomach. Normal heart size, mediastinal contours and pulmonary vascularity for technique and slight rotation. Minimal bibasilar atelectasis greater on RIGHT. Lungs otherwise clear. No pleural effusion or pneumothorax. IMPRESSION: Satisfactory endotracheal tube position. Minimal bibasilar atelectasis greater on RIGHT. Electronically Signed   By: Lavonia Dana  M.D.   On: 07/11/2015 16:18   Dg Chest Port 1 View  07/10/2015  CLINICAL DATA:  Post intubation with history of hypertension EXAM: PORTABLE CHEST 1 VIEW COMPARISON:  07/10/2015 FINDINGS: Endotracheal tube tip is 5.8  cm above the carina. Tip dislocated above the clavicle level. Lungs are clear. Mild cardiac enlargement stable. IMPRESSION: Endotracheal tube as described Electronically Signed   By: Skipper Cliche M.D.   On: 07/10/2015 16:27   Dg Chest Port 1 View  07/10/2015  CLINICAL DATA:  Loss of consciousness.  Possible aspiration. EXAM: PORTABLE CHEST 1 VIEW COMPARISON:  Chest radiograph 07/04/2015. FINDINGS: Patient is rotated. Stable cardiac and mediastinal contours. Low lung volumes. No consolidative pulmonary opacities. No pleural effusion or pneumothorax. Regional skeleton is unremarkable. IMPRESSION: No acute cardiopulmonary process. Electronically Signed   By: Lovey Newcomer M.D.   On: 07/10/2015 10:48   Dg Fluoro Guide Lumbar Puncture  07/11/2015  CLINICAL DATA:  55 year old female with history of multiple sclerosis. Altered mental status. EXAM: DIAGNOSTIC LUMBAR PUNCTURE UNDER FLUOROSCOPIC GUIDANCE FLUOROSCOPY TIME:  Radiation Exposure Index (as provided by the fluoroscopic device): 37.34 micro Gray PROCEDURE: Witnessed informed consent was obtained from the patient's sister prior to the procedure (by phone as patient not able to give consent and patient is daughter is not available), including potential complications of headache, bleeding, allergy, and pain. With the patient prone and on a ventilator, the lower back was prepped with Betadine. 1% Lidocaine was used for local anesthesia. Lumbar puncture was performed at the L2-3 level with a single pass of a 22 gauge needle with return of clear CSF with an opening pressure of 11 cm water. Nine ml of CSF were obtained for laboratory studies. The patient tolerated the procedure well and there were no apparent complications. IMPRESSION: Successful  fluoroscopic guided L2-3 lumbar puncture with collection of 9 cc of clear cerebral spinal fluid which was sent for labs as per request. Electronically Signed   By: Genia Del M.D.   On: 07/11/2015 15:59    Not all labs, radiology exams or other studies done during hospitalization come through on my EPIC note; however they are reviewed by me.    Assessment and Plan  Acute encephalopathy Patient was admitted to the intensive care unit as she was intubated on the time of admission, extubated on 1/18. Neurology was consulted. Patient underwent extensive neurological testing including LP, MRI and EEG non-diagnostic. 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 EEG on 1/10 was abnormal with moderate severe generalized slowing of cerebral activity with triphasic discharges as well as rhythmic theta activity. Patient was placed on Vimpat, Dilantin. Stress dose steroids were tapered. Repeat EEG was done on 1/12 which showed rare triphasic waves with mild slowing, consistent with nonspecific cerebral dysfunction/encephalopathy. Per neurology there has not been any definite evidence for seizure and hence AED's were titrated down and stopped. Patient remained stable and had no seizure activity after stopping AED's. If she were to relapse and then this may need to be revisited again. - Urine culture showed Acinetobacter, completed Zosyn 1/10-1/14, repeat urine culture negative so far. - Ammonia level normal, B12 normal, TSH normal - PTOT evaluation recommended skilled nursing facility - Per critical care, likely symptoms could be from polypharmacy, ciprofloxacin and suggested holding most of her medications. Baclofen is decreased, Neurontin, Cymbalta, narcotics, and imipramine has been placed on hold. SNF - will monitor but pt has been on these meds for years so why  now  Acute respiratory failure ([place]) Resolved, extubated on 1/18  - Passed a swallow testing, on dysphagia 2 diet SNF - pt stated did not want  to be intubated again,she is tired; the only thing keeping her here is her daughter;we talked for a while; I have consulted palliative to speak with her   Acute kidney injury ([place]) uncertain etiology - resolved with normal renal function at present   Hypertensive heart disease with CHF (congestive heart failure) ([place]) SNF ; controlled on lasix, aldactone and coreg 25 mg BID  Chronic combined systolic and diastolic congestive heart failure ([facility]) SNF - stable; cont coreg, lasix and spironolactone  Adrenal insufficiency (Addison's disease) ([facility]) Cortisol low at 1.4 1/10 - muted response on ACTH stim testing -SNF -  taper steroids; refer to endocrine it pt allows  Neurogenic bladder SNF - chronic and stable; cont vesicare and flomax  MULTIPLE SCLEROSIS, PROGRESSIVE/RELAPSING SNF - Pt who is normally stable with her chronic illness is now unstable; I think this is underlying problem and pt feels it is too; she is tired and if it wasn't for her daughter she would leave   Time spent > 45 moin;> 50% of time with patient was spent reviewing records, labs, tests and studies, counseling and developing plan of care  Hennie Duos, MD

## 2015-08-12 ENCOUNTER — Encounter: Payer: Self-pay | Admitting: Internal Medicine

## 2015-08-12 DIAGNOSIS — E271 Primary adrenocortical insufficiency: Secondary | ICD-10-CM | POA: Insufficient documentation

## 2015-08-12 NOTE — Assessment & Plan Note (Signed)
Resolved, extubated on 1/18  - Passed a swallow testing, on dysphagia 2 diet SNF - pt stated did not want to be intubated again,she is tired; the only thing keeping her here is her daughter;we talked for a while; I have consulted palliative to speak with her

## 2015-08-12 NOTE — Assessment & Plan Note (Signed)
SNF - chronic and stable; cont vesicare and flomax

## 2015-08-12 NOTE — Assessment & Plan Note (Signed)
SNF - stable; cont coreg, lasix and spironolactone

## 2015-08-12 NOTE — Assessment & Plan Note (Signed)
uncertain etiology - resolved with normal renal function at present

## 2015-08-12 NOTE — Assessment & Plan Note (Signed)
Patient was admitted to the intensive care unit as she was intubated on the time of admission, extubated on 1/18. Neurology was consulted. Patient underwent extensive neurological testing including LP, MRI and EEG non-diagnostic. 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 EEG on 1/10 was abnormal with moderate severe generalized slowing of cerebral activity with triphasic discharges as well as rhythmic theta activity. Patient was placed on Vimpat, Dilantin. Stress dose steroids were tapered. Repeat EEG was done on 1/12 which showed rare triphasic waves with mild slowing, consistent with nonspecific cerebral dysfunction/encephalopathy. Per neurology there has not been any definite evidence for seizure and hence AED's were titrated down and stopped. Patient remained stable and had no seizure activity after stopping AED's. If she were to relapse and then this may need to be revisited again. - Urine culture showed Acinetobacter, completed Zosyn 1/10-1/14, repeat urine culture negative so far. - Ammonia level normal, B12 normal, TSH normal - PTOT evaluation recommended skilled nursing facility - Per critical care, likely symptoms could be from polypharmacy, ciprofloxacin and suggested holding most of her medications. Baclofen is decreased, Neurontin, Cymbalta, narcotics, and imipramine has been placed on hold. SNF - will monitor but pt has been on these meds for years so why now

## 2015-08-12 NOTE — Assessment & Plan Note (Signed)
Cortisol low at 1.4 1/10 - muted response on ACTH stim testing -SNF -  taper steroids; refer to endocrine it pt allows

## 2015-08-12 NOTE — Assessment & Plan Note (Signed)
SNF ; controlled on lasix, aldactone and coreg 25 mg BID

## 2015-08-12 NOTE — Assessment & Plan Note (Addendum)
SNF - Pt who is normally stable with her chronic illness is now unstable;pt was seen by neuro who felt everything to be from exacerbation of MS but no med changes; I too  think this is underlying problem and pt feels it is too; she is tired and if it wasn't for her daughter she would leave

## 2015-08-16 ENCOUNTER — Encounter: Payer: Self-pay | Admitting: Adult Health

## 2015-08-16 NOTE — Progress Notes (Signed)
Patient ID: Shannon Garrett, female   DOB: 01-04-1961, 55 y.o.   MRN: [id number]    Facility:  [facility]       Allergies  Allergen Reactions  . Sulfonamide Derivatives Hives and Shortness Of Breath  . Penicillins Hives and Itching    Has patient had a PCN reaction causing immediate rash, facial/tongue/throat swelling, SOB or lightheadedness with hypotension: unknown Has patient had a PCN reaction causing severe rash involving mucus membranes or skin necrosis: unknown Has patient had a PCN reaction that required hospitalization: unknown Has patient had a PCN reaction occurring within the last 10 years: unknown If all of the above answers are "NO", then may proceed with Cephalosporin use. Prior course of rocephin charted 05/2015    Chief Complaint  Patient presents with  . Hospitalization Follow-up    HPI:  She is a long term resident of this facility who has been hospitalized for acute encephalopathy due to polypharmacy. Today she is lethargic and is unable to fully participate in the hpi or ros. Her medications were cut back significantly while she was hospitalized. There are no nursing concerns at this time.    Past Medical History  Diagnosis Date  . MS (multiple sclerosis) ([place])     a. Dx'd late 20's. b. Tx with Novantrone, Tysabri, Copaxone previously.  Marland Kitchen Hypertension   . Depression   . Glaucoma   . Cardiomyopathy ([place])     a.  Echo 04/29/12: Mild LVH, EF 20-25%, mild AI, moderate MR, moderate LAE, mild RAE, mild RVE, moderate TR, PASP 51, small pericardial effusion;   b. probably non-ischemic given multiple chemo-Tx agents used for MS and global LV dysfn on echo  . Chronic systolic heart failure [facility])     Past Surgical History  Procedure Laterality Date  . Ablation      uterine  . Cesarean section      VITAL SIGNS BP 122/68 mmHg  Pulse 78  Temp(Src) 97.2 F (36.2 C)  Resp 16  Ht '5\' 5"'$  (1.651 m)  Wt 196 lb (88.905 kg)  BMI 32.62 kg/m2  SpO2 99%  Patient's  Medications  New Prescriptions   No medications on file  Previous Medications   ACETAMINOPHEN (TYLENOL) 325 MG TABLET    Take 2 tablets (650 mg total) by mouth every 6 (six) hours as needed for mild pain or fever (or Fever >/= 101).   AMANTADINE (SYMMETREL) 100 MG CAPSULE    Take 100 mg by mouth daily.    BACLOFEN (LIORESAL) 10 MG TABLET    Take 0.5 tablets (5 mg total) by mouth 3 (three) times daily.   CARVEDILOL (COREG) 25 MG TABLET    Take 1 tablet (25 mg total) by mouth 2 (two) times daily with a meal.   CHOLECALCIFEROL (VITAMIN D-3 PO)    Take 1 tablet by mouth daily.   CRANBERRY PO    Take 100 mg by mouth daily.   CYANOCOBALAMIN (VITAMIN B-12 PO)    Take 1 tablet by mouth daily.   DOCUSATE SODIUM (COLACE) 100 MG CAPSULE    Take 100 mg by mouth 2 (two) times daily.   FAMOTIDINE (PEPCID) 20 MG TABLET    Take 1 tablet (20 mg total) by mouth 2 (two) times daily.   FEEDING SUPPLEMENT, ENSURE ENLIVE, (ENSURE ENLIVE) LIQD    Take 237 mLs by mouth 2 (two) times daily as needed (Please provide to pt if she she eats less than 50% of breakfast and dinner).   FUROSEMIDE (LASIX)  80 MG TABLET    Take 1 tablet (80 mg total) by mouth daily.   LATANOPROST (XALATAN) 0.005 % OPHTHALMIC SOLUTION    Place 1 drop into both eyes at bedtime.   MAGNESIUM HYDROXIDE (MILK OF MAGNESIA) 400 MG/5ML SUSPENSION    Take 30 mLs by mouth every 8 (eight) hours as needed for mild constipation or moderate constipation.   ONDANSETRON (ZOFRAN) 4 MG TABLET    Take 1 tablet (4 mg total) by mouth every 6 (six) hours as needed for nausea.   POLYETHYLENE GLYCOL (MIRALAX / GLYCOLAX) PACKET    Take 17 g by mouth daily as needed for mild constipation.   PREDNISONE (DELTASONE) 10 MG TABLET    Take 1 tablet (10 mg total) by mouth daily with breakfast. X 3days, then off   PROPYLENE GLYCOL (SYSTANE BALANCE) 0.6 % SOLN    Place 1 drop into both eyes 2 (two) times daily.   SOLIFENACIN (VESICARE) 10 MG TABLET    Take 10 mg by mouth daily. For  bladder spasms   SPIRONOLACTONE (ALDACTONE) 25 MG TABLET    Take 25 mg by mouth daily. For CHF   TAMSULOSIN (FLOMAX) 0.4 MG CAPS CAPSULE    Take 0.4 mg by mouth daily. For incontinence  Modified Medications   No medications on file  Discontinued Medications   No medications on file     SIGNIFICANT DIAGNOSTIC EXAMS   10-08-14: MRI brain: No significant change in diffuse white matter lesions consistent with patient's history of multiple sclerosis. None of these areas demonstrate abnormal restriction or enhancement as may be seen with areas of acute demyelination. Global atrophy. 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 Right parotid 1 cm lesion does not have typical appearance of an intra parotid lymph node and therefore primary parotid lesion is a possibility. ENT consultation may be considered for further delineation.  10-08-14: mri of cervical; spine: Scattered new areas of altered signal intensity (compared to 2007) throughout the cervical cord as detailed above consistent with patient's history of multiple sclerosis. None of these areas demonstrate enhancement as may be seen with areas of active demyelination. Cervical spondylotic changes as detailed above most prominent C5-6 level.  12-13-14: ABI: right: mild atherosclerotic disease   01-10-15: ct of head: No acute intracranial abnormalities. Mild atrophy. White matter disease compatible with history of MS.  01-10-15: chest x-ray: No active disease  01-12-15: 2-d echo: Left ventricle: The cavity size was normal. There was mild concentric hypertrophy. Systolic function was normal. The estimated ejection fraction was in the range of 60% to 65%. Wall motion was normal; there were no regional wall motion abnormalities. Doppler parameters are consistent with abnormal left ventricular relaxation (grade 1 diastolic dysfunction). - Aortic valve: There was mild regurgitation. - Mitral  valve: Calcified annulus. There was mild regurgitation.  05-30-15: chest x-ray: No active disease.    LABS REVIEWED:   08-14-14: tsh 2.03  09-28-14: wbc 5.7 ;hgb 10.0; hct 31.6; mcv 84.5; plt 323; glucose 87; bun 16.4; creat 1.00; k+3.5; na++142; liver normal albumin 3.7; vit d 33.41 10-05-14: wbc 5.0; hgb 10.8; hct 32.2; mcv 82.8; plt 309; bun 94; bun 24; creat 1.02; k+3.3; na++139; liver normal albumin 3.7; urine culture: enterococcus  12-16-14: wbc 5.9; hgb 10.8; hct 35.6; mcv 87.4; plt 273; glucose 111; bun 39.1; creat 1.30; k+3.9; na++139; liver normal albumin 3.9  01-10-15: wbc 6.2; hgb 11.3; hct 34.0; mcv  84.2; plt 291; glucose 101; bun 21; creat 0.89; k+4.5; na++140; alk phos 138; albumin 3.4; ;urine culture: enterococcus 01-11-15: wbc 6.3; hgb 10.2; hct 31.2; mcv 83.9; ;plt 256; glucose 112; bun 17; creat 0.79; k+3.9; na++145; liver normal albumin 3.1; hgb a1c 5.3; tsh 1.209 01-12-15: HIV: nr; RPR: nr; ammonia 30; iron 50; tibc 253; folate 24.2; vit b 12: 2487  04-18-15: enterococcus faecalis: macrobid  05-30-15: urine culture: multiple bacteria  06-02-15: wbc 12.2; hgb 10.4; hct 32.0; mcv 84.7; plt 257; glucose 119; bun 18; creat 0.99; k+ 3.5; na++150; liver normal albumin 3.3  07-10-15: tsh 0.557; vit B12: 2449 07-23-15: wbc 7.9; hgb 8.7; hct 25.5; mcv 79.4; plt 304; glucose 135; bun 7; creat 0.67; k+ 4.9; na++142; mag 2.2     Review of Systems  Unable to perform ROS: medical condition is lethargic      Physical Exam Constitutional: . No distress.  Overweight   Neck: Neck supple. No JVD present. No thyromegaly present.  Cardiovascular: Normal rate, regular rhythm and intact distal pulses.   Respiratory: Effort normal and breath sounds normal. No respiratory distress.  GI: Soft. Bowel sounds are normal. She exhibits no distension. There is no tenderness.  Musculoskeletal: She exhibits no edema.  Is able to move right  upper extremity Limited movement in her lower extremities  and  left upper extremity  Neurological: lethargic  .  Skin: Skin is warm and dry. She is not diaphoretic.       ASSESSMENT/ PLAN:   1. MS: is slowly declining   Has been  followed by  neurology;  Has paraplegia.will continue amantadine 100 mg daily baclofen 5 mg three times daily for muscle spasms;    We need to consider a palliative care consult    2. Hypertension: will continue coreg 25 mg twice daily and will monitor   3. Chronic combined systolic and diastolic heart failure: is stable will continue lasix 80 mg daily; with k+ 20 meq daily; will continue aldactone 25 mg daily; her EF is 60-65%.   4. Neurogenic bladder: takes vesicare 10 mg daily and flomax 0.4 mg daily    5. Depression: is currently not on medications; will not make changes will monitor   6. Chronic pain: she is presently stable   will continue  neurontin 900 mg three times daily; she had been hospitalized due to polypharmacy; she will require close monitoring for further medications for pain management.   9. Constipation: will continue  miralax daily as needed  and colace twice daily       Time spent with patient  50   minutes >50% time spent counseling; reviewing medical record; tests; labs; and developing future plan of care   Ok Edwards NP [facility]  Contact [phone] Monday through Friday 8am- 5pm  After hours call [phone]

## 2015-08-18 ENCOUNTER — Encounter: Payer: Self-pay | Admitting: Internal Medicine

## 2015-08-18 ENCOUNTER — Non-Acute Institutional Stay (SKILLED_NURSING_FACILITY): Payer: Medicare Other | Admitting: Internal Medicine

## 2015-08-18 DIAGNOSIS — G8220 Paraplegia, unspecified: Secondary | ICD-10-CM

## 2015-08-18 DIAGNOSIS — G35 Multiple sclerosis: Secondary | ICD-10-CM

## 2015-08-18 NOTE — Progress Notes (Signed)
MRN: [id number] Name: Shannon Garrett  Sex: female Age: 55 y.o. DOB: 1961-07-02  [place] #: Shannon Garrett Facility/Room:230 Level Of Care: SNF Provider: Inocencio Homes D Emergency Contacts: Extended Emergency Contact Information Primary Emergency Contact: [place], [place] [address] [place] Phone: [phone] Relation: Sister Secondary Emergency Contact: Jefm Bryant, [place] Phone: [phone] Mobile Phone: [phone] Relation: Friend  Code Status:   Allergies: Sulfonamide derivatives and Penicillins  Chief Complaint  Patient presents with  . Medical Management of Chronic Issues    HPI: Patient is 55 y.o. female with end stage MS who has been hospitalized 3 times in the past few months. The last hospitalization was especially hard for her. I intended to see her for a routine visit but we had an end of life discussion instead.  Past Medical History  Diagnosis Date  . MS (multiple sclerosis) ([place])     a. Dx'd late 20's. b. Tx with Novantrone, Tysabri, Copaxone previously.  Marland Kitchen Hypertension   . Depression   . Glaucoma   . Cardiomyopathy ([place])     a.  Echo 04/29/12: Mild LVH, EF 20-25%, mild AI, moderate MR, moderate LAE, mild RAE, mild RVE, moderate TR, PASP 51, small pericardial effusion;   b. probably non-ischemic given multiple chemo-Tx agents used for MS and global LV dysfn on echo  . Chronic systolic heart failure [facility])     Past Surgical History  Procedure Laterality Date  . Ablation      uterine  . Cesarean section        Medication List       This list is accurate as of: 08/18/15 10:09 PM.  Always use your most recent med list.               acetaminophen 325 MG tablet  Commonly known as:  TYLENOL  Take 2 tablets (650 mg total) by mouth every 6 (six) hours as needed for mild pain or fever (or Fever >/= 101).     amantadine 100 MG capsule  Commonly known as:  SYMMETREL  Take  100 mg by mouth daily.     baclofen 10 MG tablet  Commonly known as:  LIORESAL  Take 0.5 tablets (5 mg total) by mouth 3 (three) times daily.     carvedilol 25 MG tablet  Commonly known as:  COREG  Take 1 tablet (25 mg total) by mouth 2 (two) times daily with a meal.     CRANBERRY PO  Take 100 mg by mouth daily.     docusate sodium 100 MG capsule  Commonly known as:  COLACE  Take 100 mg by mouth 2 (two) times daily.     famotidine 20 MG tablet  Commonly known as:  PEPCID  Take 1 tablet (20 mg total) by mouth 2 (two) times daily.     feeding supplement (ENSURE ENLIVE) Liqd  Take 237 mLs by mouth 2 (two) times daily as needed (Please provide to pt if she she eats less than 50% of breakfast and dinner).     furosemide 80 MG tablet  Commonly known as:  LASIX  Take 1 tablet (80 mg total) by mouth daily.     magnesium hydroxide 400 MG/5ML suspension  Commonly known as:  MILK OF MAGNESIA  Take 30 mLs by mouth every 8 (eight) hours as needed for mild constipation or moderate constipation.  ondansetron 4 MG tablet  Commonly known as:  ZOFRAN  Take 1 tablet (4 mg total) by mouth every 6 (six) hours as needed for nausea.     polyethylene glycol packet  Commonly known as:  MIRALAX / GLYCOLAX  Take 17 g by mouth daily as needed for mild constipation.     predniSONE 10 MG tablet  Commonly known as:  DELTASONE  Take 1 tablet (10 mg total) by mouth daily with breakfast. X 3days, then off     solifenacin 10 MG tablet  Commonly known as:  VESICARE  Take 10 mg by mouth daily. For bladder spasms     spironolactone 25 MG tablet  Commonly known as:  ALDACTONE  Take 25 mg by mouth daily. For CHF     SYSTANE BALANCE 0.6 % Soln  Generic drug:  Propylene Glycol  Place 1 drop into both eyes 2 (two) times daily.     tamsulosin 0.4 MG Caps capsule  Commonly known as:  FLOMAX  Take 0.4 mg by mouth daily. For incontinence     VITAMIN B-12 PO  Take 1 tablet by mouth daily.      VITAMIN D-3 PO  Take 1 tablet by mouth daily.     XALATAN 0.005 % ophthalmic solution  Generic drug:  latanoprost  Place 1 drop into both eyes at bedtime.        No orders of the defined types were placed in this encounter.    Immunization History  Administered Date(s) Administered  . Influenza,inj,Quad PF,36+ Mos 08/01/2013  . Influenza-Unspecified 08/31/2014  . Pneumococcal Polysaccharide-23 08/01/2013  . Pneumococcal-Unspecified 08/30/2013, 07/29/2014    Social History  Substance Use Topics  . Smoking status: Former Research scientist (life sciences)  . Smokeless tobacco: Never Used  . Alcohol Use: No    Review of Systems  DATA OBTAINED: from patient GENERAL:  no fevers, fatigue, appetite changes- pt asked for an air matt; she can not turn herself and get very uncomfortable when waiting to be turned.ress SKIN: No itching, rash HEENT: No complaint RESPIRATORY: No cough, wheezing, SOB CARDIAC: No chest pain, palpitations, lower extremity edema  GI: No abdominal pain, No N/V/D or constipation, No heartburn or reflux  GU: No dysuria, frequency or urgency, or incontinence  MUSCULOSKELETAL: No unrelieved bone/joint pain NEUROLOGIC: No headache, dizziness  PSYCHIATRIC: flat  Filed Vitals:   08/18/15 1358  BP: 105/48  Pulse: 80  Temp: 97.2 F (36.2 C)  Resp: 16    Physical Exam  GENERAL APPEARANCE: Alert, conversant, No acute distress  SKIN: No diaphoresis rash HEENT: Unremarkable RESPIRATORY: Breathing is even, unlabored. Lung sounds are clear   CARDIOVASCULAR: Heart RRR no murmurs, rubs or gallops. No peripheral edema  GASTROINTESTINAL: Abdomen is soft, non-tender, not distended w/ normal bowel sounds.  GENITOURINARY: Bladder non tender, not distended  MUSCULOSKELETAL: BLE contractures NEUROLOGIC: Cranial nerves 2-12 grossly intact. Moves BUE some PSYCHIATRIC: Mood and affect appropriate to situation, no behavioral issues  Patient Active Problem List   Diagnosis Date Noted  .  Adrenal insufficiency (Addison's disease) ([place]) 08/12/2015  . Altered mental status   . Acute respiratory failure ([place])   . Gram-negative infection 07/06/2015  . Sepsis ([place]) 07/04/2015  . Vitamin D deficiency 06/18/2015  . Multiple sclerosis exacerbation ([place]) 06/08/2015  . Weakness 06/08/2015  . Chronic systolic (congestive) heart failure ([place]) 06/08/2015  . Acute encephalopathy 06/03/2015  . Acute kidney injury ([place]) 06/03/2015  . Hypokalemia 06/03/2015  . Sepsis secondary to UTI ([place]) 05/30/2015  . Hypernatremia  05/30/2015  . Dehydration 05/30/2015  . Chronic anemia 05/30/2015  . Chronic pain syndrome 03/03/2015  . Benign hypertensive heart disease without heart failure 01/18/2015  . Urinary tract infection due to Enterococcus   . Essential hypertension   . Abnormal LFTs   . Foot drop, bilateral   . Pressure ulcer 01/12/2015  . UTI (lower urinary tract infection) 01/11/2015  . Hypovolemia 01/11/2015  . UTI (urinary tract infection), uncomplicated   . Cardiomyopathy ([place])   . Urinary incontinence 01/07/2015  . Chronic constipation 11/26/2014  . Depression 11/26/2014  . Tachycardia 02/20/2014  . Venous stasis ulcers ([place]) 02/20/2014  . Leg wound, left 11/04/2013  . FTT (failure to thrive) in adult 07/31/2013  . Medically noncompliant 07/31/2013  . Blisters of multiple sites 07/31/2013  . Generalized weakness 06/03/2013  . Insomnia 06/03/2013  . Neurogenic bladder 06/03/2013  . DS (disseminated sclerosis) ([place]) 02/12/2013  . Chronic diastolic heart failure ([place]) 09/29/2012  . Elevated CK 09/10/2012  . Severe malnutrition ([place]) 09/05/2012  . Paraplegia ([place]) 09/05/2012  . Fall 09/04/2012  . Chronic combined systolic and diastolic congestive heart failure ([place]) 05/22/2012  . Other primary cardiomyopathies 05/01/2012  . Immobility 04/28/2012  . OBESITY 11/30/2008  . ANEMIA, CHRONIC 11/30/2008  . MULTIPLE SCLEROSIS, PROGRESSIVE/RELAPSING 11/30/2008  . Hypertensive  heart disease with CHF (congestive heart failure) ([place]) 11/30/2008  . ECZEMA 11/30/2008    CBC    Component Value Date/Time   WBC 7.9 07/23/2015 0450   WBC 6.2 05/18/2015 1626   RBC 3.21* 07/23/2015 0450   RBC 3.55* 05/18/2015 1626   RBC 3.70* 01/12/2015 2111   HGB 8.7* 07/23/2015 0450   HCT 25.5* 07/23/2015 0450   HCT 29.6* 05/18/2015 1626   PLT 304 07/23/2015 0450   PLT 222 05/18/2015 1626   MCV 79.4 07/23/2015 0450   MCV 83 05/18/2015 1626   LYMPHSABS 1.5 07/10/2015 1124   LYMPHSABS 2.1 05/18/2015 1626   MONOABS 0.5 07/10/2015 1124   EOSABS 0.2 07/10/2015 1124   EOSABS 0.5* 05/18/2015 1626   BASOSABS 0.1 07/10/2015 1124   BASOSABS 0.1 05/18/2015 1626    CMP     Component Value Date/Time   NA 142 07/23/2015 0450   NA 142 09/28/2014   K 4.9 07/23/2015 0450   CL 109 07/23/2015 0450   CO2 25 07/23/2015 0450   GLUCOSE 135* 07/23/2015 0450   BUN 7 07/23/2015 0450   BUN 16 09/28/2014   CREATININE 0.67 07/23/2015 0450   CALCIUM 9.1 07/23/2015 0450   PROT 7.5 07/10/2015 1507   ALBUMIN 3.6 07/10/2015 1507   AST 17 07/10/2015 1507   ALT 31 07/10/2015 1507   ALKPHOS 166* 07/10/2015 1507   BILITOT 0.6 07/10/2015 1507   GFRNONAA >60 07/23/2015 0450   GFRAA >60 07/23/2015 0450    Assessment and Plan  MULTIPLE SCLEROSIS, PROGRESSIVE/RELAPSING We discussed at length about end of life options. She had discussed with her daughter assisted suicide which her daughter, understandably,could not support. However we discussed options like no hospitalizations again, no treatments, no IV's etc. We discussed exactly what DNR meant, as pt is a DNR and she couldn't understand why she was taken to the hospitalization in  the first place. I encouraged her to keep communicating with her daughter her wishes. We plan to talk again.  Paraplegia Have ordered pt air mattress to prevent decubitus ulcers and increase her comfort   Time spent . 25 min;> 50% of time with patient was spent  reviewing records, labs, tests  and studies, counseling and developing plan of care  Hennie Duos, MD

## 2015-08-18 NOTE — Assessment & Plan Note (Signed)
Have ordered pt air mattress to prevent decubitus ulcers and increase her comfort

## 2015-08-18 NOTE — Assessment & Plan Note (Signed)
We discussed at length about end of life options. She had discussed with her daughter assisted suicide which her daughter, understandably,could not support. However we discussed options like no hospitalizations again, no treatments, no IV's etc. We discussed exactly what DNR meant, as pt is a DNR and she couldn't understand why she was taken to the hospitalization in  the first place. I encouraged her to keep communicating with her daughter her wishes. We plan to talk again.

## 2015-08-29 ENCOUNTER — Encounter: Payer: Self-pay | Admitting: *Deleted

## 2015-09-21 ENCOUNTER — Telehealth: Payer: Self-pay | Admitting: Neurology

## 2015-09-21 NOTE — Telephone Encounter (Signed)
[facility] called wanting to know when to ship out natalizumab (TYSABRI) 300 MG/15ML injection. Please call [phone].

## 2015-09-22 NOTE — Telephone Encounter (Signed)
Message printed, given to Otila Kluver in the infusion suite/fim

## 2015-09-23 NOTE — Telephone Encounter (Signed)
[facility] called to ask about shipment of Tysabri, I spoke with Otila Kluver who handles all shipment online.

## 2015-09-26 ENCOUNTER — Non-Acute Institutional Stay (SKILLED_NURSING_FACILITY): Payer: Medicare Other | Admitting: Internal Medicine

## 2015-09-26 DIAGNOSIS — K5909 Other constipation: Secondary | ICD-10-CM

## 2015-09-26 DIAGNOSIS — G35 Multiple sclerosis: Secondary | ICD-10-CM | POA: Diagnosis not present

## 2015-09-26 DIAGNOSIS — R300 Dysuria: Secondary | ICD-10-CM

## 2015-09-26 DIAGNOSIS — K5900 Constipation, unspecified: Secondary | ICD-10-CM | POA: Diagnosis not present

## 2015-10-01 ENCOUNTER — Encounter: Payer: Self-pay | Admitting: Internal Medicine

## 2015-10-01 NOTE — Assessment & Plan Note (Signed)
Have written for robaxin for pt and inquired about timing of her next injection.

## 2015-10-01 NOTE — Progress Notes (Signed)
MRN: [id number] Name: Shannon Garrett  Sex: female Age: 55 y.o. DOB: Jun 21, 1961  [place] #: Karren Burly Facility/Room: Level Of Care: SNF Provider: Inocencio Homes D Emergency Contacts: Extended Emergency Contact Information Primary Emergency Contact: Becker, [place] [address] [place] Phone: [phone] Relation: Sister Secondary Emergency Contact: Jefm Bryant, [place] Phone: [phone] Mobile Phone: [phone] Relation: Friend  Code Status:   Allergies: Sulfonamide derivatives and Penicillins  Chief Complaint  Patient presents with  . Medical Management of Chronic Issues    HPI: Patient is 55 y.o. female with MS who was going to be seen for routine issues but she had 3 c/o : constipation- more gas and bloating, denies reflux sx, desire for muscle relaxers - she's getting close to time for her injections and she feels her muscles getting tighter, she was taken off multiple meds when she was last at the hospital and urinary sx- pain and presure, no fever, no odor, no weakness.  Past Medical History  Diagnosis Date  . MS (multiple sclerosis) ([place])     a. Dx'd late 20's. b. Tx with Novantrone, Tysabri, Copaxone previously.  Marland Kitchen Hypertension   . Depression   . Glaucoma   . Cardiomyopathy ([place])     a.  Echo 04/29/12: Mild LVH, EF 20-25%, mild AI, moderate MR, moderate LAE, mild RAE, mild RVE, moderate TR, PASP 51, small pericardial effusion;   b. probably non-ischemic given multiple chemo-Tx agents used for MS and global LV dysfn on echo  . Chronic systolic heart failure [facility])     Past Surgical History  Procedure Laterality Date  . Ablation      uterine  . Cesarean section        Medication List       This list is accurate as of: 09/26/15 11:59 PM.  Always use your most recent med list.               acetaminophen 325 MG tablet  Commonly known as:  TYLENOL  Take 2 tablets (650 mg  total) by mouth every 6 (six) hours as needed for mild pain or fever (or Fever >/= 101).     amantadine 100 MG capsule  Commonly known as:  SYMMETREL  Take 100 mg by mouth daily.     baclofen 10 MG tablet  Commonly known as:  LIORESAL  Take 0.5 tablets (5 mg total) by mouth 3 (three) times daily.     carvedilol 25 MG tablet  Commonly known as:  COREG  Take 1 tablet (25 mg total) by mouth 2 (two) times daily with a meal.     CRANBERRY PO  Take 100 mg by mouth daily.     docusate sodium 100 MG capsule  Commonly known as:  COLACE  Take 100 mg by mouth 2 (two) times daily.     famotidine 20 MG tablet  Commonly known as:  PEPCID  Take 1 tablet (20 mg total) by mouth 2 (two) times daily.     feeding supplement (ENSURE ENLIVE) Liqd  Take 237 mLs by mouth 2 (two) times daily as needed (Please provide to pt if she she eats less than 50% of breakfast and dinner).     furosemide 80 MG tablet  Commonly known as:  LASIX  Take 1 tablet (80 mg total) by mouth daily.     magnesium hydroxide  400 MG/5ML suspension  Commonly known as:  MILK OF MAGNESIA  Take 30 mLs by mouth every 8 (eight) hours as needed for mild constipation or moderate constipation.     natalizumab 300 MG/15ML injection  Commonly known as:  TYSABRI  Inject into the vein.     ondansetron 4 MG tablet  Commonly known as:  ZOFRAN  Take 1 tablet (4 mg total) by mouth every 6 (six) hours as needed for nausea.     polyethylene glycol packet  Commonly known as:  MIRALAX / GLYCOLAX  Take 17 g by mouth daily as needed for mild constipation.     predniSONE 10 MG tablet  Commonly known as:  DELTASONE  Take 1 tablet (10 mg total) by mouth daily with breakfast. X 3days, then off     solifenacin 10 MG tablet  Commonly known as:  VESICARE  Take 10 mg by mouth daily. For bladder spasms     spironolactone 25 MG tablet  Commonly known as:  ALDACTONE  Take 25 mg by mouth daily. For CHF     SYSTANE BALANCE 0.6 % Soln  Generic  drug:  Propylene Glycol  Place 1 drop into both eyes 2 (two) times daily.     tamsulosin 0.4 MG Caps capsule  Commonly known as:  FLOMAX  Take 0.4 mg by mouth daily. For incontinence     VITAMIN B-12 PO  Take 1 tablet by mouth daily.     VITAMIN D-3 PO  Take 1 tablet by mouth daily.     XALATAN 0.005 % ophthalmic solution  Generic drug:  latanoprost  Place 1 drop into both eyes at bedtime.        No orders of the defined types were placed in this encounter.    Immunization History  Administered Date(s) Administered  . Influenza,inj,Quad PF,36+ Mos 08/01/2013  . Influenza-Unspecified 08/31/2014  . Pneumococcal Polysaccharide-23 08/01/2013  . Pneumococcal-Unspecified 08/30/2013, 07/29/2014    Social History  Substance Use Topics  . Smoking status: Former Research scientist (life sciences)  . Smokeless tobacco: Never Used  . Alcohol Use: No    Review of Systems  DATA OBTAINED: from patient, nurse- per HPI GENERAL:  no fevers, fatigue, appetite changes SKIN: No itching, rash HEENT: No complaint RESPIRATORY: No cough, wheezing, SOB CARDIAC: No chest pain, palpitations, lower extremity edema  GI: No abdominal pain, No N/V/D or constipation, No heartburn or reflux  GU: + dysuria,no  frequency or urgency, or incontinence. + pressure MUSCULOSKELETAL: No unrelieved bone/joint pain; muscles feel tight NEUROLOGIC: No headache, dizziness  PSYCHIATRIC: No overt anxiety or sadness  Filed Vitals:   10/01/15 2105  BP: 117/62  Pulse: 72  Temp: 97.2 F (36.2 C)  Resp: 16    Physical Exam  GENERAL APPEARANCE: Alert, conversant, No acute distress  SKIN: No diaphoresis rash HEENT: Unremarkable RESPIRATORY: Breathing is even, unlabored. Lung sounds are clear   CARDIOVASCULAR: Heart RRR no murmurs, rubs or gallops. No peripheral edema  GASTROINTESTINAL: Abdomen is soft, non-tender, not distended w/ normal bowel sounds.  GENITOURINARY: Bladder non tender, not distended  MUSCULOSKELETAL: wasting and  contractures BLU NEUROLOGIC: Cranial nerves 2-12 grossly intact. Moves BUE some PSYCHIATRIC: Mood and affect appropriate to situation, no behavioral issues  Patient Active Problem List   Diagnosis Date Noted  . Adrenal insufficiency (Addison's disease) ([place]) 08/12/2015  . Altered mental status   . Acute respiratory failure ([place])   . Gram-negative infection 07/06/2015  . Sepsis ([place]) 07/04/2015  . Vitamin D deficiency 06/18/2015  .  Multiple sclerosis exacerbation ([place]) 06/08/2015  . Weakness 06/08/2015  . Chronic systolic (congestive) heart failure ([place]) 06/08/2015  . Acute encephalopathy 06/03/2015  . Acute kidney injury ([place]) 06/03/2015  . Hypokalemia 06/03/2015  . Sepsis secondary to UTI ([place]) 05/30/2015  . Hypernatremia 05/30/2015  . Dehydration 05/30/2015  . Chronic anemia 05/30/2015  . Chronic pain syndrome 03/03/2015  . Benign hypertensive heart disease without heart failure 01/18/2015  . Urinary tract infection due to Enterococcus   . Essential hypertension   . Abnormal LFTs   . Foot drop, bilateral   . Pressure ulcer 01/12/2015  . UTI (lower urinary tract infection) 01/11/2015  . Hypovolemia 01/11/2015  . UTI (urinary tract infection), uncomplicated   . Cardiomyopathy ([place])   . Urinary incontinence 01/07/2015  . Chronic constipation 11/26/2014  . Depression 11/26/2014  . Tachycardia 02/20/2014  . Venous stasis ulcers ([place]) 02/20/2014  . Leg wound, left 11/04/2013  . FTT (failure to thrive) in adult 07/31/2013  . Medically noncompliant 07/31/2013  . Blisters of multiple sites 07/31/2013  . Generalized weakness 06/03/2013  . Insomnia 06/03/2013  . Neurogenic bladder 06/03/2013  . DS (disseminated sclerosis) ([place]) 02/12/2013  . Chronic diastolic heart failure ([place]) 09/29/2012  . Elevated CK 09/10/2012  . Severe malnutrition ([place]) 09/05/2012  . Paraplegia ([place]) 09/05/2012  . Fall 09/04/2012  . Chronic combined systolic and diastolic congestive heart failure  ([place]) 05/22/2012  . Other primary cardiomyopathies 05/01/2012  . Immobility 04/28/2012  . OBESITY 11/30/2008  . ANEMIA, CHRONIC 11/30/2008  . MULTIPLE SCLEROSIS, PROGRESSIVE/RELAPSING 11/30/2008  . Hypertensive heart disease with CHF (congestive heart failure) ([place]) 11/30/2008  . ECZEMA 11/30/2008    CBC    Component Value Date/Time   WBC 7.9 07/23/2015 0450   WBC 6.2 05/18/2015 1626   RBC 3.21* 07/23/2015 0450   RBC 3.55* 05/18/2015 1626   RBC 3.70* 01/12/2015 2111   HGB 8.7* 07/23/2015 0450   HCT 25.5* 07/23/2015 0450   HCT 29.6* 05/18/2015 1626   PLT 304 07/23/2015 0450   PLT 222 05/18/2015 1626   MCV 79.4 07/23/2015 0450   MCV 83 05/18/2015 1626   LYMPHSABS 1.5 07/10/2015 1124   LYMPHSABS 2.1 05/18/2015 1626   MONOABS 0.5 07/10/2015 1124   EOSABS 0.2 07/10/2015 1124   EOSABS 0.5* 05/18/2015 1626   BASOSABS 0.1 07/10/2015 1124   BASOSABS 0.1 05/18/2015 1626    CMP     Component Value Date/Time   NA 142 07/23/2015 0450   NA 142 09/28/2014   K 4.9 07/23/2015 0450   CL 109 07/23/2015 0450   CO2 25 07/23/2015 0450   GLUCOSE 135* 07/23/2015 0450   BUN 7 07/23/2015 0450   BUN 16 09/28/2014   CREATININE 0.67 07/23/2015 0450   CALCIUM 9.1 07/23/2015 0450   PROT 7.5 07/10/2015 1507   ALBUMIN 3.6 07/10/2015 1507   AST 17 07/10/2015 1507   ALT 31 07/10/2015 1507   ALKPHOS 166* 07/10/2015 1507   BILITOT 0.6 07/10/2015 1507   GFRNONAA >60 07/23/2015 0450   GFRAA >60 07/23/2015 0450    Assessment and Plan  MULTIPLE SCLEROSIS, PROGRESSIVE/RELAPSING Have written for robaxin for pt and inquired about timing of her next injection.  Chronic constipation Changed colace to senokot -S BID  PRESSURE,DYSURIA- have ordered U/A; pt has been uroseptic twice recently so ...  Hennie Duos, MD

## 2015-10-01 NOTE — Assessment & Plan Note (Signed)
Changed colace to senokot -S BID

## 2015-10-10 NOTE — Telephone Encounter (Signed)
Patient called to advise, [facility] called wanting to know why patient didn't have infusion last month. Please call [phone].

## 2015-10-10 NOTE — Telephone Encounter (Signed)
Patient also states, "can feel MS tightening up on her"

## 2015-10-11 NOTE — Telephone Encounter (Signed)
Patient returned call, regarding Tysabri infusion, [facility] called nursing home, wanting to know why she didn't have treatment last month, patient asks that nurse call back to [facility], where she is a resident, doesn't know phone number.

## 2015-10-11 NOTE — Telephone Encounter (Signed)
I have spoken with Shannon Garrett in the infusion suite.  She reports pt. has noshowed her last 2 infusion appts.  She will call today to offer another appt/fim

## 2015-10-25 ENCOUNTER — Encounter: Payer: Self-pay | Admitting: Adult Health

## 2015-10-25 ENCOUNTER — Non-Acute Institutional Stay (SKILLED_NURSING_FACILITY): Payer: Medicare Other | Admitting: Adult Health

## 2015-10-25 DIAGNOSIS — K5900 Constipation, unspecified: Secondary | ICD-10-CM

## 2015-10-25 DIAGNOSIS — G894 Chronic pain syndrome: Secondary | ICD-10-CM

## 2015-10-25 DIAGNOSIS — I110 Hypertensive heart disease with heart failure: Secondary | ICD-10-CM | POA: Diagnosis not present

## 2015-10-25 DIAGNOSIS — N319 Neuromuscular dysfunction of bladder, unspecified: Secondary | ICD-10-CM | POA: Diagnosis not present

## 2015-10-25 DIAGNOSIS — I5042 Chronic combined systolic (congestive) and diastolic (congestive) heart failure: Secondary | ICD-10-CM

## 2015-10-25 DIAGNOSIS — F329 Major depressive disorder, single episode, unspecified: Secondary | ICD-10-CM | POA: Diagnosis not present

## 2015-10-25 DIAGNOSIS — G35 Multiple sclerosis: Secondary | ICD-10-CM

## 2015-10-25 DIAGNOSIS — K5909 Other constipation: Secondary | ICD-10-CM

## 2015-10-25 DIAGNOSIS — F32A Depression, unspecified: Secondary | ICD-10-CM

## 2015-10-25 NOTE — Progress Notes (Signed)
Patient ID: Shannon Garrett, female   DOB: Feb 22, 1961, 55 y.o.   MRN: [id number]   Facility:  [facility]       Allergies  Allergen Reactions  . Sulfonamide Derivatives Hives and Shortness Of Breath  . Penicillins Hives and Itching    Has patient had a PCN reaction causing immediate rash, facial/tongue/throat swelling, SOB or lightheadedness with hypotension: unknown Has patient had a PCN reaction causing severe rash involving mucus membranes or skin necrosis: unknown Has patient had a PCN reaction that required hospitalization: unknown Has patient had a PCN reaction occurring within the last 10 years: unknown If all of the above answers are "NO", then may proceed with Cephalosporin use. Prior course of rocephin charted 05/2015    Chief Complaint  Patient presents with  . Medical Management of Chronic Issues    Follow up    HPI:  She is a long term resident of this facility being seen for the management of her chronic illnesses. Overall her status is stale. She is not voicing any complaints or concerns at this time. There are no nursing concerns.    Past Medical History  Diagnosis Date  . MS (multiple sclerosis) ([facility])     a. Dx'd late 20's. b. Tx with Novantrone, Tysabri, Copaxone previously.  Marland Kitchen Hypertension   . Depression   . Glaucoma   . Cardiomyopathy ([facility])     a.  Echo 04/29/12: Mild LVH, EF 20-25%, mild AI, moderate MR, moderate LAE, mild RAE, mild RVE, moderate TR, PASP 51, small pericardial effusion;   b. probably non-ischemic given multiple chemo-Tx agents used for MS and global LV dysfn on echo  . Chronic systolic heart failure [facility])     Past Surgical History  Procedure Laterality Date  . Ablation      uterine  . Cesarean section      VITAL SIGNS BP 132/70 mmHg  Pulse 72  Temp(Src) 97.2 F (36.2 C) (Oral)  Resp 16  Ht 5\' 5"  (1.651 m)  Wt 222 lb 4 oz (100.812 kg)  BMI 36.98 kg/m2  SpO2 99%  Patient's Medications  New Prescriptions   No medications on  file  Previous Medications   ACETAMINOPHEN (TYLENOL) 325 MG TABLET    Take 2 tablets (650 mg total) by mouth every 6 (six) hours as needed for mild pain or fever (or Fever >/= 101).   AMANTADINE (SYMMETREL) 100 MG CAPSULE    Take 100 mg by mouth daily.    BACLOFEN (LIORESAL) 20 MG TABLET    Take 20 mg by mouth 4 (four) times daily.   CARVEDILOL (COREG) 25 MG TABLET    Take 1 tablet (25 mg total) by mouth 2 (two) times daily with a meal.   CHOLECALCIFEROL (VITAMIN D-3 PO)    Take 1 tablet by mouth daily.   CRANBERRY PO    Take 100 mg by mouth daily.   CYANOCOBALAMIN (VITAMIN B-12 PO)    Take 1 tablet by mouth daily.   DULOXETINE (CYMBALTA) 60 MG CAPSULE    Take 60 mg by mouth daily.   FAMOTIDINE (PEPCID) 20 MG TABLET    Take 1 tablet (20 mg total) by mouth 2 (two) times daily.   FEEDING SUPPLEMENT, ENSURE ENLIVE, (ENSURE ENLIVE) LIQD    Take 237 mLs by mouth 2 (two) times daily as needed (Please provide to pt if she she eats less than 50% of breakfast and dinner).   FUROSEMIDE (LASIX) 80 MG TABLET    Take 1 tablet (80  mg total) by mouth daily.   GABAPENTIN (NEURONTIN) 300 MG CAPSULE    Take 300 mg by mouth 3 (three) times daily.   LATANOPROST (XALATAN) 0.005 % OPHTHALMIC SOLUTION    Place 1 drop into both eyes at bedtime.   MAGNESIUM HYDROXIDE (MILK OF MAGNESIA) 400 MG/5ML SUSPENSION    Take 30 mLs by mouth every 8 (eight) hours as needed for mild constipation or moderate constipation.   MIRABEGRON ER (MYRBETRIQ) 25 MG TB24 TABLET    Take 25 mg by mouth at bedtime.   ONDANSETRON (ZOFRAN) 4 MG TABLET    Take 1 tablet (4 mg total) by mouth every 6 (six) hours as needed for nausea.   POLYETHYLENE GLYCOL (MIRALAX / GLYCOLAX) PACKET    Take 17 g by mouth daily as needed for mild constipation.   POTASSIUM CHLORIDE (KLOR-CON) 20 MEQ PACKET    Take by mouth daily.   PROPYLENE GLYCOL (SYSTANE BALANCE) 0.6 % SOLN    Place 1 drop into both eyes 2 (two) times daily.   SENNA-DOCUSATE (SENOKOT S) 8.6-50 MG  TABLET    Take 1 tablet by mouth 2 (two) times daily.   SPIRONOLACTONE (ALDACTONE) 25 MG TABLET    Take 25 mg by mouth daily. For CHF   tysabri Infusion every 2 weeks at [facility]    TAMSULOSIN (FLOMAX) 0.4 MG CAPS CAPSULE    Take 0.4 mg by mouth daily. For incontinence  Modified Medications   No medications on file  Discontinued Medications     SIGNIFICANT DIAGNOSTIC EXAMS  10-08-14: MRI brain: No significant change in diffuse white matter lesions consistent with patient's history of multiple sclerosis. None of these areas demonstrate abnormal restriction or enhancement as may be seen with areas of acute demyelination. Global atrophy. 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 Right parotid 1 cm lesion does not have typical appearance of an intra parotid lymph node and therefore primary parotid lesion is a possibility. ENT consultation may be considered for further delineation.  10-08-14: mri of cervical; spine: Scattered new areas of altered signal intensity (compared to 2007) throughout the cervical cord as detailed above consistent with patient's history of multiple sclerosis. None of these areas demonstrate enhancement as may be seen with areas of active demyelination. Cervical spondylotic changes as detailed above most prominent C5-6 level.  12-13-14: ABI: right: mild atherosclerotic disease   01-10-15: ct of head: No acute intracranial abnormalities. Mild atrophy. White matter disease compatible with history of MS.  01-10-15: chest x-ray: No active disease  01-12-15: 2-d echo: Left ventricle: The cavity size was normal. There was mild concentric hypertrophy. Systolic function was normal. The estimated ejection fraction was in the range of 60% to 65%. Wall motion was normal; there were no regional wall motion abnormalities. Doppler parameters are consistent with abnormal left ventricular relaxation (grade 1 diastolic  dysfunction). - Aortic valve: There was mild regurgitation. - Mitral valve: Calcified annulus. There was mild regurgitation.  05-30-15: chest x-ray: No active disease.    LABS REVIEWED:   12-16-14: wbc 5.9; hgb 10.8; hct 35.6; mcv 87.4; plt 273; glucose 111; bun 39.1; creat 1.30; k+3.9; na++139; liver normal albumin 3.9  01-10-15: wbc 6.2; hgb 11.3; hct 34.0; mcv 84.2; plt 291; glucose 101; bun 21; creat 0.89; k+4.5; na++140; alk phos 138; albumin 3.4; ;urine culture: enterococcus 01-11-15: wbc 6.3; hgb 10.2; hct 31.2; mcv 83.9; ;plt 256; glucose 112; bun 17;  creat 0.79; k+3.9; na++145; liver normal albumin 3.1; hgb a1c 5.3; tsh 1.209 01-12-15: HIV: nr; RPR: nr; ammonia 30; iron 50; tibc 253; folate 24.2; vit b 12: 2487  04-18-15: enterococcus faecalis: macrobid  05-30-15: urine culture: multiple bacteria  06-02-15: wbc 12.2; hgb 10.4; hct 32.0; mcv 84.7; plt 257; glucose 119; bun 18; creat 0.99; k+ 3.5; na++150; liver normal albumin 3.3  07-10-15: tsh 0.557; vit B12: 2449 07-23-15: wbc 7.9; hgb 8.7; hct 25.5; mcv 79.4; plt 304; glucose 135; bun 7; creat 0.67; k+ 4.9; na++142; mag 2.2  08-19-15: wbc 9.9; hgb 10.6; hct 33.2; mcv 81.8; plt 400 glucose 105; bun 11.0; creat 1.13; k+ 4.1; na++141; liver normal albumin 3.7  urine culture: proteus mirabilis: cipro     Review of Systems  Constitutional: Negative for malaise/fatigue.  Respiratory: Negative for cough and shortness of breath.   Cardiovascular: Negative for chest pain, palpitations and leg swelling.  Gastrointestinal: Negative for heartburn, abdominal pain and constipation.  Musculoskeletal: Negative for myalgias, back pain and joint pain.  Skin: Negative.   Neurological: Negative for dizziness.  Psychiatric/Behavioral: The patient is not nervous/anxious.      Physical Exam Constitutional: . No distress.  Overweight   Neck: Neck supple. No JVD present. No thyromegaly present.  Cardiovascular: Normal rate, regular rhythm and intact  distal pulses.   Respiratory: Effort normal and breath sounds normal. No respiratory distress.  GI: Soft. Bowel sounds are normal. She exhibits no distension. There is no tenderness.  Musculoskeletal: She exhibits no edema.  Is able to move right  upper extremity Limited movement in her lower extremities  and left upper extremity  Neurological: lethargic  .  Skin: Skin is warm and dry. She is not diaphoretic.       ASSESSMENT/ PLAN:   1. MS: is slowly declining   Has been  followed by  neurology;  Has paraplegia.will continue amantadine 100 mg daily baclofen 20 mg four times daily for muscle spasms;    tysabri infusions every 2 weeks.    2. Hypertension: will continue coreg 25 mg twice daily and will monitor   3. Chronic combined systolic and diastolic heart failure: is stable will continue lasix 80 mg daily; with k+ 20 meq daily; will continue aldactone 25 mg daily; her EF is 60-65%.   4. Neurogenic bladder: takes myebetriq 25 m  daily and flomax 0.4 mg daily    5. Depression: is presently taking cymbalta 60 mg daily; which also helps with her pain management.    6. Chronic pain: she is presently stable   will continue  neurontin 900 mg three times daily; takes cymbalta 60 mg daily    9. Constipation: will continue  miralax twice  daily and senna s twice daily   10. Gerd: will continue pepcid 20 mg twice daily   11. Glaucoma: will continue xalatan to both eyes.          Ok Edwards NP [facility]  Contact [phone] Monday through Friday 8am- 5pm  After hours call [phone]

## 2015-10-27 ENCOUNTER — Non-Acute Institutional Stay (SKILLED_NURSING_FACILITY): Payer: Medicare Other | Admitting: Internal Medicine

## 2015-10-27 DIAGNOSIS — B3731 Acute candidiasis of vulva and vagina: Secondary | ICD-10-CM

## 2015-10-27 DIAGNOSIS — B373 Candidiasis of vulva and vagina: Secondary | ICD-10-CM | POA: Diagnosis not present

## 2015-10-30 ENCOUNTER — Encounter: Payer: Self-pay | Admitting: Internal Medicine

## 2015-10-30 NOTE — Progress Notes (Signed)
MRN: [id number] Name: Shannon Garrett  Sex: female Age: 55 y.o. DOB: [birth year]  [place] #: Karren Burly Facility/Room:230 Level Of Care: SNF Provider: Inocencio Homes D Emergency Contacts: Extended Emergency Contact Information Primary Emergency Contact: [place], [place] [address] [place] Phone: [phone] Relation: Sister Secondary Emergency Contact: Jefm Bryant, [place] Phone: [phone] Mobile Phone: [phone] Relation: Friend  Code Status:   Allergies: Sulfonamide derivatives and Penicillins  Chief Complaint  Patient presents with  . Acute Visit    HPI: Patient is 55 y.o. female with MS who is being seen acutely at her request. She thinks she may have a yeast infection. She has itching in GU area. She has had prior that felt just like this.  Past Medical History  Diagnosis Date  . MS (multiple sclerosis) ([place])     a. Dx'd late 20's. b. Tx with Novantrone, Tysabri, Copaxone previously.  Marland Kitchen Hypertension   . Depression   . Glaucoma   . Cardiomyopathy ([place])     a.  Echo 04/29/12: Mild LVH, EF 20-25%, mild AI, moderate MR, moderate LAE, mild RAE, mild RVE, moderate TR, PASP 51, small pericardial effusion;   b. probably non-ischemic given multiple chemo-Tx agents used for MS and global LV dysfn on echo  . Chronic systolic heart failure [facility])     Past Surgical History  Procedure Laterality Date  . Ablation      uterine  . Cesarean section        Medication List       This list is accurate as of: 10/27/15 11:59 PM.  Always use your most recent med list.               acetaminophen 325 MG tablet  Commonly known as:  TYLENOL  Take 2 tablets (650 mg total) by mouth every 6 (six) hours as needed for mild pain or fever (or Fever >/= 101).     amantadine 100 MG capsule  Commonly known as:  SYMMETREL  Take 100 mg by mouth daily.     baclofen 20 MG tablet  Commonly known as:   LIORESAL  Take 20 mg by mouth 4 (four) times daily.     carvedilol 25 MG tablet  Commonly known as:  COREG  Take 1 tablet (25 mg total) by mouth 2 (two) times daily with a meal.     CRANBERRY PO  Take 100 mg by mouth daily.     DULoxetine 60 MG capsule  Commonly known as:  CYMBALTA  Take 60 mg by mouth daily.     famotidine 20 MG tablet  Commonly known as:  PEPCID  Take 1 tablet (20 mg total) by mouth 2 (two) times daily.     feeding supplement (ENSURE ENLIVE) Liqd  Take 237 mLs by mouth 2 (two) times daily as needed (Please provide to pt if she she eats less than 50% of breakfast and dinner).     furosemide 80 MG tablet  Commonly known as:  LASIX  Take 1 tablet (80 mg total) by mouth daily.     gabapentin 300 MG capsule  Commonly known as:  NEURONTIN  Take 300 mg by mouth 3 (three) times daily.     magnesium hydroxide 400 MG/5ML suspension  Commonly known as:  MILK OF MAGNESIA  Take 30 mLs by mouth every 8 (eight) hours as needed  for mild constipation or moderate constipation.     mirabegron ER 25 MG Tb24 tablet  Commonly known as:  MYRBETRIQ  Take 25 mg by mouth at bedtime.     ondansetron 4 MG tablet  Commonly known as:  ZOFRAN  Take 1 tablet (4 mg total) by mouth every 6 (six) hours as needed for nausea.     polyethylene glycol packet  Commonly known as:  MIRALAX / GLYCOLAX  Take 17 g by mouth daily as needed for mild constipation.     potassium chloride 20 MEQ packet  Commonly known as:  KLOR-CON  Take by mouth daily.     SENOKOT S 8.6-50 MG tablet  Generic drug:  senna-docusate  Take 1 tablet by mouth 2 (two) times daily.     spironolactone 25 MG tablet  Commonly known as:  ALDACTONE  Take 25 mg by mouth daily. For CHF     SYSTANE BALANCE 0.6 % Soln  Generic drug:  Propylene Glycol  Place 1 drop into both eyes 2 (two) times daily.     tamsulosin 0.4 MG Caps capsule  Commonly known as:  FLOMAX  Take 0.4 mg by mouth daily. For incontinence      VITAMIN B-12 PO  Take 1 tablet by mouth daily.     VITAMIN D-3 PO  Take 1 tablet by mouth daily.     XALATAN 0.005 % ophthalmic solution  Generic drug:  latanoprost  Place 1 drop into both eyes at bedtime.        No orders of the defined types were placed in this encounter.    Immunization History  Administered Date(s) Administered  . Influenza,inj,Quad PF,36+ Mos 08/01/2013  . Influenza-Unspecified 08/31/2014, 08/09/2015  . Pneumococcal Polysaccharide-23 08/01/2013  . Pneumococcal-Unspecified 08/30/2013, 07/29/2014    Social History  Substance Use Topics  . Smoking status: Former Research scientist (life sciences)  . Smokeless tobacco: Never Used  . Alcohol Use: No    Review of Systems  DATA OBTAINED: from patient, nurse, medical record, family member GENERAL:  no fevers, fatigue, appetite changes SKIN: No itching, rash HEENT: No complaint RESPIRATORY: No cough, wheezing, SOB CARDIAC: No chest pain, palpitations, lower extremity edema  GI: No abdominal pain, No N/V/D or constipation, No heartburn or reflux  GU: No dysuria, frequency or urgency, or incontinence; itching in GU area MUSCULOSKELETAL: No unrelieved bone/joint pain NEUROLOGIC: No headache, dizziness  PSYCHIATRIC: No overt anxiety or sadness  Filed Vitals:   10/30/15 1114  BP: 155/84  Pulse: 99  Temp: 97.2 F (36.2 C)  Resp: 16    Physical Exam  GENERAL APPEARANCE: Alert, conversant, No acute distress  SKIN: No diaphoresis rash, or wounds HEENT: Unremarkable RESPIRATORY: Breathing is even, unlabored. Lung sounds are clear   CARDIOVASCULAR: Heart RRR no murmurs, rubs or gallops. No peripheral edema  GASTROINTESTINAL: Abdomen is soft, non-tender, not distended w/ normal bowel sounds.  GENITOURINARY: Bladder non tender, not distended  MUSCULOSKELETAL: wasting and contractures in BLE NEUROLOGIC: Cranial nerves 2-12 grossly intact; no movement BLE; has some movement BUE PSYCHIATRIC: Mood and affect appropriate to situation,  no behavioral issues  Patient Active Problem List   Diagnosis Date Noted  . Adrenal insufficiency (Addison's disease) ([place]) 08/12/2015  . Altered mental status   . Acute respiratory failure ([place])   . Gram-negative infection 07/06/2015  . Sepsis ([place]) 07/04/2015  . Vitamin D deficiency 06/18/2015  . Multiple sclerosis exacerbation ([place]) 06/08/2015  . Weakness 06/08/2015  . Chronic systolic (congestive) heart failure ([place]) 06/08/2015  .  Acute encephalopathy 06/03/2015  . Acute kidney injury ([place]) 06/03/2015  . Hypokalemia 06/03/2015  . Sepsis secondary to UTI ([place]) 05/30/2015  . Hypernatremia 05/30/2015  . Dehydration 05/30/2015  . Chronic anemia 05/30/2015  . Chronic pain syndrome 03/03/2015  . Benign hypertensive heart disease without heart failure 01/18/2015  . Urinary tract infection due to Enterococcus   . Essential hypertension   . Abnormal LFTs   . Foot drop, bilateral   . Pressure ulcer 01/12/2015  . UTI (lower urinary tract infection) 01/11/2015  . Hypovolemia 01/11/2015  . UTI (urinary tract infection), uncomplicated   . Cardiomyopathy ([place])   . Urinary incontinence 01/07/2015  . Chronic constipation 11/26/2014  . Depression 11/26/2014  . Tachycardia 02/20/2014  . Venous stasis ulcers ([place]) 02/20/2014  . Leg wound, left 11/04/2013  . FTT (failure to thrive) in adult 07/31/2013  . Medically noncompliant 07/31/2013  . Blisters of multiple sites 07/31/2013  . Generalized weakness 06/03/2013  . Insomnia 06/03/2013  . Neurogenic bladder 06/03/2013  . DS (disseminated sclerosis) ([place]) 02/12/2013  . Chronic diastolic heart failure ([place]) 09/29/2012  . Elevated CK 09/10/2012  . Severe malnutrition ([place]) 09/05/2012  . Paraplegia ([place]) 09/05/2012  . Fall 09/04/2012  . Chronic combined systolic and diastolic congestive heart failure ([place]) 05/22/2012  . Other primary cardiomyopathies 05/01/2012  . Immobility 04/28/2012  . OBESITY 11/30/2008  . ANEMIA, CHRONIC  11/30/2008  . MULTIPLE SCLEROSIS, PROGRESSIVE/RELAPSING 11/30/2008  . Hypertensive heart disease with CHF (congestive heart failure) ([place]) 11/30/2008  . ECZEMA 11/30/2008    CBC    Component Value Date/Time   WBC 7.9 07/23/2015 0450   WBC 6.2 05/18/2015 1626   RBC 3.21* 07/23/2015 0450   RBC 3.55* 05/18/2015 1626   RBC 3.70* 01/12/2015 2111   HGB 8.7* 07/23/2015 0450   HCT 25.5* 07/23/2015 0450   HCT 29.6* 05/18/2015 1626   PLT 304 07/23/2015 0450   PLT 222 05/18/2015 1626   MCV 79.4 07/23/2015 0450   MCV 83 05/18/2015 1626   LYMPHSABS 1.5 07/10/2015 1124   LYMPHSABS 2.1 05/18/2015 1626   MONOABS 0.5 07/10/2015 1124   EOSABS 0.2 07/10/2015 1124   EOSABS 0.5* 05/18/2015 1626   BASOSABS 0.1 07/10/2015 1124   BASOSABS 0.1 05/18/2015 1626    CMP     Component Value Date/Time   NA 142 07/23/2015 0450   NA 142 09/28/2014   K 4.9 07/23/2015 0450   CL 109 07/23/2015 0450   CO2 25 07/23/2015 0450   GLUCOSE 135* 07/23/2015 0450   BUN 7 07/23/2015 0450   BUN 16 09/28/2014   CREATININE 0.67 07/23/2015 0450   CALCIUM 9.1 07/23/2015 0450   PROT 7.5 07/10/2015 1507   ALBUMIN 3.6 07/10/2015 1507   AST 17 07/10/2015 1507   ALT 31 07/10/2015 1507   ALKPHOS 166* 07/10/2015 1507   BILITOT 0.6 07/10/2015 1507   GFRNONAA >60 07/23/2015 0450   GFRAA >60 07/23/2015 0450    Assessment and Plan  CANDIDIASIS OF FEMALE GU- diflucan 150 mg , 1 po now and 1 po in 72 hours; will monitor response  Hennie Duos, MD

## 2015-11-07 ENCOUNTER — Non-Acute Institutional Stay (SKILLED_NURSING_FACILITY): Payer: Medicare Other | Admitting: Internal Medicine

## 2015-11-07 ENCOUNTER — Encounter (HOSPITAL_COMMUNITY): Payer: Self-pay | Admitting: *Deleted

## 2015-11-07 ENCOUNTER — Emergency Department (HOSPITAL_COMMUNITY)
Admission: EM | Admit: 2015-11-07 | Discharge: 2015-11-07 | Disposition: A | Discharge status: Home / Self Care | Payer: Medicare Other | Attending: Emergency Medicine | Admitting: Emergency Medicine

## 2015-11-07 ENCOUNTER — Emergency Department (HOSPITAL_COMMUNITY): Payer: Medicare Other

## 2015-11-07 ENCOUNTER — Inpatient Hospital Stay (HOSPITAL_COMMUNITY)
Admission: EM | Admit: 2015-11-07 | Discharge: 2015-11-10 | DRG: 689 | Disposition: A | Discharge status: Skilled Nursing Facility | Payer: Medicare Other | Attending: Internal Medicine | Admitting: Internal Medicine

## 2015-11-07 ENCOUNTER — Encounter: Payer: Self-pay | Admitting: Internal Medicine

## 2015-11-07 ENCOUNTER — Encounter (HOSPITAL_COMMUNITY): Payer: Self-pay | Admitting: Emergency Medicine

## 2015-11-07 DIAGNOSIS — R400 Somnolence: Secondary | ICD-10-CM

## 2015-11-07 DIAGNOSIS — Z87891 Personal history of nicotine dependence: Secondary | ICD-10-CM | POA: Insufficient documentation

## 2015-11-07 DIAGNOSIS — F329 Major depressive disorder, single episode, unspecified: Secondary | ICD-10-CM | POA: Diagnosis not present

## 2015-11-07 DIAGNOSIS — R7989 Other specified abnormal findings of blood chemistry: Secondary | ICD-10-CM | POA: Diagnosis not present

## 2015-11-07 DIAGNOSIS — N390 Urinary tract infection, site not specified: Secondary | ICD-10-CM | POA: Diagnosis not present

## 2015-11-07 DIAGNOSIS — N319 Neuromuscular dysfunction of bladder, unspecified: Secondary | ICD-10-CM | POA: Diagnosis present

## 2015-11-07 DIAGNOSIS — I10 Essential (primary) hypertension: Secondary | ICD-10-CM | POA: Diagnosis present

## 2015-11-07 DIAGNOSIS — N179 Acute kidney failure, unspecified: Secondary | ICD-10-CM | POA: Diagnosis not present

## 2015-11-07 DIAGNOSIS — R4182 Altered mental status, unspecified: Secondary | ICD-10-CM | POA: Diagnosis present

## 2015-11-07 DIAGNOSIS — I5022 Chronic systolic (congestive) heart failure: Secondary | ICD-10-CM | POA: Insufficient documentation

## 2015-11-07 DIAGNOSIS — G9340 Encephalopathy, unspecified: Secondary | ICD-10-CM | POA: Diagnosis present

## 2015-11-07 DIAGNOSIS — Z882 Allergy status to sulfonamides status: Secondary | ICD-10-CM

## 2015-11-07 DIAGNOSIS — F0390 Unspecified dementia without behavioral disturbance: Secondary | ICD-10-CM | POA: Insufficient documentation

## 2015-11-07 DIAGNOSIS — K5909 Other constipation: Secondary | ICD-10-CM | POA: Diagnosis present

## 2015-11-07 DIAGNOSIS — H409 Unspecified glaucoma: Secondary | ICD-10-CM | POA: Diagnosis not present

## 2015-11-07 DIAGNOSIS — Z803 Family history of malignant neoplasm of breast: Secondary | ICD-10-CM

## 2015-11-07 DIAGNOSIS — Z79899 Other long term (current) drug therapy: Secondary | ICD-10-CM | POA: Insufficient documentation

## 2015-11-07 DIAGNOSIS — I69391 Dysphagia following cerebral infarction: Secondary | ICD-10-CM

## 2015-11-07 DIAGNOSIS — Z880 Allergy status to penicillin: Secondary | ICD-10-CM | POA: Diagnosis not present

## 2015-11-07 DIAGNOSIS — I429 Cardiomyopathy, unspecified: Secondary | ICD-10-CM | POA: Diagnosis present

## 2015-11-07 DIAGNOSIS — E669 Obesity, unspecified: Secondary | ICD-10-CM | POA: Diagnosis present

## 2015-11-07 DIAGNOSIS — Z820 Family history of epilepsy and other diseases of the nervous system: Secondary | ICD-10-CM

## 2015-11-07 DIAGNOSIS — D649 Anemia, unspecified: Secondary | ICD-10-CM | POA: Diagnosis present

## 2015-11-07 DIAGNOSIS — G35 Multiple sclerosis: Secondary | ICD-10-CM | POA: Diagnosis present

## 2015-11-07 DIAGNOSIS — I5042 Chronic combined systolic (congestive) and diastolic (congestive) heart failure: Secondary | ICD-10-CM | POA: Diagnosis present

## 2015-11-07 DIAGNOSIS — E870 Hyperosmolality and hypernatremia: Secondary | ICD-10-CM | POA: Diagnosis present

## 2015-11-07 DIAGNOSIS — I5032 Chronic diastolic (congestive) heart failure: Secondary | ICD-10-CM | POA: Diagnosis present

## 2015-11-07 DIAGNOSIS — I110 Hypertensive heart disease with heart failure: Secondary | ICD-10-CM | POA: Diagnosis present

## 2015-11-07 DIAGNOSIS — F32A Depression, unspecified: Secondary | ICD-10-CM | POA: Diagnosis present

## 2015-11-07 DIAGNOSIS — R945 Abnormal results of liver function studies: Secondary | ICD-10-CM | POA: Diagnosis present

## 2015-11-07 DIAGNOSIS — Z66 Do not resuscitate: Secondary | ICD-10-CM | POA: Diagnosis present

## 2015-11-07 DIAGNOSIS — Z6837 Body mass index (BMI) 37.0-37.9, adult: Secondary | ICD-10-CM

## 2015-11-07 DIAGNOSIS — Z8249 Family history of ischemic heart disease and other diseases of the circulatory system: Secondary | ICD-10-CM

## 2015-11-07 DIAGNOSIS — Z87440 Personal history of urinary (tract) infections: Secondary | ICD-10-CM

## 2015-11-07 DIAGNOSIS — B964 Proteus (mirabilis) (morganii) as the cause of diseases classified elsewhere: Secondary | ICD-10-CM | POA: Diagnosis present

## 2015-11-07 DIAGNOSIS — N3001 Acute cystitis with hematuria: Secondary | ICD-10-CM

## 2015-11-07 LAB — URINE MICROSCOPIC-ADD ON

## 2015-11-07 LAB — CBC WITH DIFFERENTIAL/PLATELET
BASOS PCT: 1 %
Basophils Absolute: 0.1 10*3/uL (ref 0.0–0.1)
Eosinophils Absolute: 0.8 10*3/uL — ABNORMAL HIGH (ref 0.0–0.7)
Eosinophils Relative: 10 %
HEMATOCRIT: 35.6 % — AB (ref 36.0–46.0)
HEMOGLOBIN: 11.6 g/dL — AB (ref 12.0–15.0)
LYMPHS ABS: 2.3 10*3/uL (ref 0.7–4.0)
LYMPHS PCT: 31 %
MCH: 26.8 pg (ref 26.0–34.0)
MCHC: 32.6 g/dL (ref 30.0–36.0)
MCV: 82.2 fL (ref 78.0–100.0)
MONOS PCT: 7 %
Monocytes Absolute: 0.6 10*3/uL (ref 0.1–1.0)
NEUTROS ABS: 3.8 10*3/uL (ref 1.7–7.7)
NEUTROS PCT: 51 %
Platelets: 346 10*3/uL (ref 150–400)
RBC: 4.33 MIL/uL (ref 3.87–5.11)
RDW: 16.8 % — AB (ref 11.5–15.5)
WBC: 7.5 10*3/uL (ref 4.0–10.5)

## 2015-11-07 LAB — COMPREHENSIVE METABOLIC PANEL
ALBUMIN: 4.2 g/dL (ref 3.5–5.0)
ALK PHOS: 153 U/L — AB (ref 38–126)
ALT: 23 U/L (ref 14–54)
ANION GAP: 11 (ref 5–15)
AST: 22 U/L (ref 15–41)
BILIRUBIN TOTAL: 0.5 mg/dL (ref 0.3–1.2)
BUN: 23 mg/dL — ABNORMAL HIGH (ref 6–20)
CALCIUM: 9.5 mg/dL (ref 8.9–10.3)
CO2: 27 mmol/L (ref 22–32)
Chloride: 104 mmol/L (ref 101–111)
Creatinine, Ser: 1.23 mg/dL — ABNORMAL HIGH (ref 0.44–1.00)
GFR, EST AFRICAN AMERICAN: 57 mL/min — AB (ref >60)
GFR, EST NON AFRICAN AMERICAN: 50 mL/min — AB (ref >60)
GLUCOSE: 116 mg/dL — AB (ref 65–99)
POTASSIUM: 4.5 mmol/L (ref 3.5–5.1)
Sodium: 142 mmol/L (ref 135–145)
TOTAL PROTEIN: 8.4 g/dL — AB (ref 6.5–8.1)

## 2015-11-07 LAB — URINALYSIS, ROUTINE W REFLEX MICROSCOPIC
Bilirubin Urine: NEGATIVE
GLUCOSE, UA: NEGATIVE mg/dL
KETONES UR: NEGATIVE mg/dL
Nitrite: NEGATIVE
PROTEIN: 30 mg/dL — AB
Specific Gravity, Urine: 1.016 (ref 1.005–1.030)
pH: 7.5 (ref 5.0–8.0)

## 2015-11-07 LAB — I-STAT TROPONIN, ED: Troponin i, poc: 0 ng/mL (ref 0.00–0.08)

## 2015-11-07 LAB — I-STAT CG4 LACTIC ACID, ED
LACTIC ACID, VENOUS: 1.68 mmol/L (ref 0.5–2.0)
Lactic Acid, Venous: 1.3 mmol/L (ref 0.5–2.0)

## 2015-11-07 LAB — AMMONIA: Ammonia: 22 umol/L (ref 9–35)

## 2015-11-07 MED ORDER — POTASSIUM CHLORIDE 20 MEQ PO PACK: 20.0000 meq | PACK | Freq: Every day | ORAL | Status: DC

## 2015-11-07 MED ORDER — DEXTROSE 5 % IV SOLN
2.0000 g | Freq: Two times a day (BID) | INTRAVENOUS | Status: AC
  Administered 2015-11-08: 2 g via INTRAVENOUS
  Filled 2015-11-07: qty 2

## 2015-11-07 MED ORDER — SENNOSIDES-DOCUSATE SODIUM 8.6-50 MG PO TABS
1.0000 | ORAL_TABLET | Freq: Two times a day (BID) | ORAL | Status: DC
Start: 2015-11-07 — End: 2015-11-10
  Administered 2015-11-08 – 2015-11-10 (×5): 1 via ORAL
  Filled 2015-11-07 (×7): qty 1

## 2015-11-07 MED ORDER — ONDANSETRON HCL 4 MG PO TABS: 4.0000 mg | ORAL_TABLET | Freq: Four times a day (QID) | ORAL | Status: DC | PRN

## 2015-11-07 MED ORDER — AMANTADINE HCL 100 MG PO CAPS
100.0000 mg | ORAL_CAPSULE | Freq: Every day | ORAL | Status: DC
  Administered 2015-11-08 – 2015-11-10 (×3): 100 mg via ORAL
  Filled 2015-11-07 (×3): qty 1

## 2015-11-07 MED ORDER — ENSURE ENLIVE PO LIQD: 237.0000 mL | Freq: Two times a day (BID) | ORAL | Status: DC | PRN

## 2015-11-07 MED ORDER — FAMOTIDINE 20 MG PO TABS
20.0000 mg | ORAL_TABLET | Freq: Two times a day (BID) | ORAL | Status: DC
  Administered 2015-11-08 – 2015-11-10 (×5): 20 mg via ORAL
  Filled 2015-11-07 (×6): qty 1

## 2015-11-07 MED ORDER — CIPROFLOXACIN HCL 500 MG PO TABS: 500.0000 mg | ORAL_TABLET | Freq: Two times a day (BID) | ORAL | Status: DC

## 2015-11-07 MED ORDER — ALBUTEROL SULFATE (2.5 MG/3ML) 0.083% IN NEBU: 2.5000 mg | INHALATION_SOLUTION | RESPIRATORY_TRACT | Status: DC | PRN

## 2015-11-07 MED ORDER — GABAPENTIN 300 MG PO CAPS: 900.0000 mg | ORAL_CAPSULE | Freq: Three times a day (TID) | ORAL | Status: DC

## 2015-11-07 MED ORDER — DULOXETINE HCL 60 MG PO CPEP
60.0000 mg | ORAL_CAPSULE | Freq: Every day | ORAL | Status: DC
  Administered 2015-11-08 – 2015-11-10 (×3): 60 mg via ORAL
  Filled 2015-11-07 (×3): qty 1

## 2015-11-07 MED ORDER — CARVEDILOL 25 MG PO TABS
25.0000 mg | ORAL_TABLET | Freq: Two times a day (BID) | ORAL | Status: DC
  Administered 2015-11-08 – 2015-11-10 (×4): 25 mg via ORAL
  Filled 2015-11-07 (×7): qty 1

## 2015-11-07 MED ORDER — ENOXAPARIN SODIUM 40 MG/0.4ML ~~LOC~~ SOLN
40.0000 mg | Freq: Every day | SUBCUTANEOUS | Status: DC
  Administered 2015-11-08 – 2015-11-09 (×3): 40 mg via SUBCUTANEOUS
  Filled 2015-11-07 (×5): qty 0.4

## 2015-11-07 MED ORDER — SPIRONOLACTONE 25 MG PO TABS
25.0000 mg | ORAL_TABLET | Freq: Every day | ORAL | Status: DC
  Administered 2015-11-08: 25 mg via ORAL
  Filled 2015-11-07 (×2): qty 1

## 2015-11-07 MED ORDER — LATANOPROST 0.005 % OP SOLN
1.0000 [drp] | Freq: Every day | OPHTHALMIC | Status: DC
  Administered 2015-11-08 – 2015-11-09 (×3): 1 [drp] via OPHTHALMIC
  Filled 2015-11-07: qty 2.5

## 2015-11-07 MED ORDER — ACETAMINOPHEN 325 MG PO TABS
650.0000 mg | ORAL_TABLET | Freq: Four times a day (QID) | ORAL | Status: DC | PRN
  Administered 2015-11-08 – 2015-11-09 (×2): 650 mg via ORAL
  Filled 2015-11-07 (×2): qty 2

## 2015-11-07 MED ORDER — DEXTROSE 5 % IV SOLN: 2.0000 g | Freq: Once | INTRAVENOUS | Status: DC

## 2015-11-07 MED ORDER — SODIUM CHLORIDE 0.9 % IV BOLUS (SEPSIS)
1000.0000 mL | Freq: Once | INTRAVENOUS | Status: AC
  Administered 2015-11-07: 1000 mL via INTRAVENOUS

## 2015-11-07 MED ORDER — VITAMIN B-12 1000 MCG PO TABS
1000.0000 ug | ORAL_TABLET | Freq: Every day | ORAL | Status: DC
  Administered 2015-11-08 – 2015-11-10 (×3): 1000 ug via ORAL
  Filled 2015-11-07 (×3): qty 1

## 2015-11-07 MED ORDER — LEVOFLOXACIN IN D5W 750 MG/150ML IV SOLN: 750.0000 mg | Freq: Once | INTRAVENOUS | Status: DC

## 2015-11-07 MED ORDER — GABAPENTIN 400 MG PO CAPS
700.0000 mg | ORAL_CAPSULE | Freq: Two times a day (BID) | ORAL | Status: DC
  Administered 2015-11-08 – 2015-11-10 (×5): 700 mg via ORAL
  Filled 2015-11-07 (×6): qty 1

## 2015-11-07 MED ORDER — ONDANSETRON HCL 4 MG/2ML IJ SOLN: 4.0000 mg | Freq: Four times a day (QID) | INTRAMUSCULAR | Status: DC | PRN

## 2015-11-07 MED ORDER — POLYVINYL ALCOHOL 1.4 % OP SOLN
1.0000 [drp] | Freq: Two times a day (BID) | OPHTHALMIC | Status: DC
  Administered 2015-11-08 – 2015-11-10 (×6): 1 [drp] via OPHTHALMIC
  Filled 2015-11-07: qty 15

## 2015-11-07 MED ORDER — VITAMIN D 1000 UNITS PO TABS
1000.0000 [IU] | ORAL_TABLET | Freq: Every day | ORAL | Status: DC
  Administered 2015-11-08 – 2015-11-10 (×3): 1000 [IU] via ORAL
  Filled 2015-11-07 (×3): qty 1

## 2015-11-07 MED ORDER — SODIUM CHLORIDE 0.9 % IV SOLN
INTRAVENOUS | Status: DC
  Administered 2015-11-08: 01:00:00 via INTRAVENOUS

## 2015-11-07 MED ORDER — CIPROFLOXACIN IN D5W 400 MG/200ML IV SOLN
400.0000 mg | Freq: Once | INTRAVENOUS | Status: AC
  Administered 2015-11-07: 400 mg via INTRAVENOUS
  Filled 2015-11-07: qty 200

## 2015-11-07 MED ORDER — DEXTROSE 5 % IV SOLN: 1.0000 g | Freq: Once | INTRAVENOUS | Status: DC

## 2015-11-07 MED ORDER — TAMSULOSIN HCL 0.4 MG PO CAPS
0.4000 mg | ORAL_CAPSULE | Freq: Every day | ORAL | Status: DC
  Administered 2015-11-08 – 2015-11-10 (×3): 0.4 mg via ORAL
  Filled 2015-11-07 (×3): qty 1

## 2015-11-07 MED ORDER — POLYETHYLENE GLYCOL 3350 17 G PO PACK
17.0000 g | PACK | Freq: Two times a day (BID) | ORAL | Status: DC
  Administered 2015-11-08 – 2015-11-10 (×5): 17 g via ORAL
  Filled 2015-11-07 (×7): qty 1

## 2015-11-07 NOTE — Discharge Instructions (Signed)

## 2015-11-07 NOTE — Progress Notes (Signed)
MRN: [id number] Name: Shannon Garrett  Sex: female Age: 55 y.o. DOB: 03-Nov-1960  [place] #: Karren Burly Facility/Room: Level Of Care: SNF Provider: Inocencio Homes D Emergency Contacts: Extended Emergency Contact Information Primary Emergency Contact: [place], [place] [address] [place] Phone: [phone] Relation: Sister Secondary Emergency Contact: Jefm Bryant, [place] Phone: [phone] Mobile Phone: [phone] Relation: Friend  Code Status:   Allergies: Sulfonamide derivatives and Penicillins  Chief Complaint  Patient presents with  . Acute Visit    HPI: Patient is 55 y.o. female with MS who nursing asked me to see because she has been minimally response all am. Pt opens yes to voice, stares at me and moans. This was her presentation last 2 hospitalizatons when she was septic. Pt afebrile , O2 sat was 95-97% on RA and BS was nl. The nurse who is taking care of her today has never seen her before today. There was no report of any problems over the weekend.   Past Medical History  Diagnosis Date  . MS (multiple sclerosis) ([place])     a. Dx'd late 20's. b. Tx with Novantrone, Tysabri, Copaxone previously.  Marland Kitchen Hypertension   . Depression   . Glaucoma   . Cardiomyopathy ([place])     a.  Echo 04/29/12: Mild LVH, EF 20-25%, mild AI, moderate MR, moderate LAE, mild RAE, mild RVE, moderate TR, PASP 51, small pericardial effusion;   b. probably non-ischemic given multiple chemo-Tx agents used for MS and global LV dysfn on echo  . Chronic systolic heart failure [facility])     Past Surgical History  Procedure Laterality Date  . Ablation      uterine  . Cesarean section        Medication List       This list is accurate as of: 11/07/15  1:11 PM.  Always use your most recent med list.               acetaminophen 325 MG tablet  Commonly known as:  TYLENOL  Take 2 tablets (650 mg total) by mouth every  6 (six) hours as needed for mild pain or fever (or Fever >/= 101).     amantadine 100 MG capsule  Commonly known as:  SYMMETREL  Take 100 mg by mouth daily.     baclofen 20 MG tablet  Commonly known as:  LIORESAL  Take 20 mg by mouth 4 (four) times daily.     carvedilol 25 MG tablet  Commonly known as:  COREG  Take 1 tablet (25 mg total) by mouth 2 (two) times daily with a meal.     CRANBERRY PO  Take 100 mg by mouth daily.     DULoxetine 60 MG capsule  Commonly known as:  CYMBALTA  Take 60 mg by mouth daily.     famotidine 20 MG tablet  Commonly known as:  PEPCID  Take 1 tablet (20 mg total) by mouth 2 (two) times daily.     feeding supplement (ENSURE ENLIVE) Liqd  Take 237 mLs by mouth 2 (two) times daily as needed (Please provide to pt if she she eats less than 50% of breakfast and dinner).     furosemide 80 MG tablet  Commonly known as:  LASIX  Take 1 tablet (80 mg total) by mouth daily.     gabapentin 300 MG capsule  Commonly known as:  NEURONTIN  Take 300 mg by mouth 3 (three) times daily.     magnesium hydroxide 400 MG/5ML suspension  Commonly known as:  MILK OF MAGNESIA  Take 30 mLs by mouth every 8 (eight) hours as needed for mild constipation or moderate constipation.     mirabegron ER 25 MG Tb24 tablet  Commonly known as:  MYRBETRIQ  Take 25 mg by mouth at bedtime.     ondansetron 4 MG tablet  Commonly known as:  ZOFRAN  Take 1 tablet (4 mg total) by mouth every 6 (six) hours as needed for nausea.     polyethylene glycol packet  Commonly known as:  MIRALAX / GLYCOLAX  Take 17 g by mouth daily as needed for mild constipation.     potassium chloride 20 MEQ packet  Commonly known as:  KLOR-CON  Take by mouth daily.     SENOKOT S 8.6-50 MG tablet  Generic drug:  senna-docusate  Take 1 tablet by mouth 2 (two) times daily.     spironolactone 25 MG tablet  Commonly known as:  ALDACTONE  Take 25 mg by mouth daily. For CHF     SYSTANE BALANCE 0.6 %  Soln  Generic drug:  Propylene Glycol  Place 1 drop into both eyes 2 (two) times daily.     tamsulosin 0.4 MG Caps capsule  Commonly known as:  FLOMAX  Take 0.4 mg by mouth daily. For incontinence     VITAMIN B-12 PO  Take 1 tablet by mouth daily.     VITAMIN D-3 PO  Take 1 tablet by mouth daily.     XALATAN 0.005 % ophthalmic solution  Generic drug:  latanoprost  Place 1 drop into both eyes at bedtime.        No orders of the defined types were placed in this encounter.    Immunization History  Administered Date(s) Administered  . Influenza,inj,Quad PF,36+ Mos 08/01/2013  . Influenza-Unspecified 08/31/2014, 08/09/2015  . Pneumococcal Polysaccharide-23 08/01/2013  . Pneumococcal-Unspecified 08/30/2013, 07/29/2014    Social History  Substance Use Topics  . Smoking status: Former Research scientist (life sciences)  . Smokeless tobacco: Never Used  . Alcohol Use: No    Review of Systems  UTO 2/2 pt's MS; nursing asper HPI    Filed Vitals:   11/07/15 1301  BP: 147/79  Pulse: 66  Temp: 98.2 F (36.8 C)  Resp: 16    Physical Exam  GENERAL APPEARANCE: very somnalent , moans SKIN: No diaphoresis rash HEENT: pupils large, equal and reactive, L is sluggishly reactive RESPIRATORY: Breathing is even, unlabored. Lung sounds are clear   CARDIOVASCULAR: Heart RRR no murmurs, rubs or gallops. No peripheral edema  GASTROINTESTINAL: Abdomen is soft, non-tender, mild  distended w/ dec bowel sounds.  GENITOURINARY: Bladder non tender, not distended  MUSCULOSKELETAL: wasting and contractures NEUROLOGIC: Cranial nerves 2-12 grossly intact; quadriplegia but some movement BUE PSYCHIATRIC: somnalent  Patient Active Problem List   Diagnosis Date Noted  . Adrenal insufficiency (Addison's disease) ([place]) 08/12/2015  . Altered mental status   . Acute respiratory failure ([place])   . Gram-negative infection 07/06/2015  . Sepsis ([place]) 07/04/2015  . Vitamin D deficiency 06/18/2015  . Multiple sclerosis  exacerbation ([place]) 06/08/2015  . Weakness 06/08/2015  . Chronic systolic (congestive) heart failure ([place]) 06/08/2015  . Acute encephalopathy 06/03/2015  . Acute kidney injury ([place]) 06/03/2015  . Hypokalemia 06/03/2015  . Sepsis secondary to UTI ([place]) 05/30/2015  . Hypernatremia 05/30/2015  . Dehydration 05/30/2015  .  Chronic anemia 05/30/2015  . Chronic pain syndrome 03/03/2015  . Benign hypertensive heart disease without heart failure 01/18/2015  . Urinary tract infection due to Enterococcus   . Essential hypertension   . Abnormal LFTs   . Foot drop, bilateral   . Pressure ulcer 01/12/2015  . UTI (lower urinary tract infection) 01/11/2015  . Hypovolemia 01/11/2015  . UTI (urinary tract infection), uncomplicated   . Cardiomyopathy ([place])   . Urinary incontinence 01/07/2015  . Chronic constipation 11/26/2014  . Depression 11/26/2014  . Tachycardia 02/20/2014  . Venous stasis ulcers ([place]) 02/20/2014  . Leg wound, left 11/04/2013  . FTT (failure to thrive) in adult 07/31/2013  . Medically noncompliant 07/31/2013  . Blisters of multiple sites 07/31/2013  . Generalized weakness 06/03/2013  . Insomnia 06/03/2013  . Neurogenic bladder 06/03/2013  . DS (disseminated sclerosis) ([place]) 02/12/2013  . Chronic diastolic heart failure ([place]) 09/29/2012  . Elevated CK 09/10/2012  . Severe malnutrition ([place]) 09/05/2012  . Paraplegia ([place]) 09/05/2012  . Fall 09/04/2012  . Chronic combined systolic and diastolic congestive heart failure ([place]) 05/22/2012  . Other primary cardiomyopathies 05/01/2012  . Immobility 04/28/2012  . OBESITY 11/30/2008  . ANEMIA, CHRONIC 11/30/2008  . MULTIPLE SCLEROSIS, PROGRESSIVE/RELAPSING 11/30/2008  . Hypertensive heart disease with CHF (congestive heart failure) ([place]) 11/30/2008  . ECZEMA 11/30/2008    CBC    Component Value Date/Time   WBC 7.9 07/23/2015 0450   WBC 6.2 05/18/2015 1626   RBC 3.21* 07/23/2015 0450   RBC 3.55* 05/18/2015 1626   RBC  3.70* 01/12/2015 2111   HGB 8.7* 07/23/2015 0450   HCT 25.5* 07/23/2015 0450   HCT 29.6* 05/18/2015 1626   PLT 304 07/23/2015 0450   PLT 222 05/18/2015 1626   MCV 79.4 07/23/2015 0450   MCV 83 05/18/2015 1626   LYMPHSABS 1.5 07/10/2015 1124   LYMPHSABS 2.1 05/18/2015 1626   MONOABS 0.5 07/10/2015 1124   EOSABS 0.2 07/10/2015 1124   EOSABS 0.5* 05/18/2015 1626   BASOSABS 0.1 07/10/2015 1124   BASOSABS 0.1 05/18/2015 1626    CMP     Component Value Date/Time   NA 142 07/23/2015 0450   NA 142 09/28/2014   K 4.9 07/23/2015 0450   CL 109 07/23/2015 0450   CO2 25 07/23/2015 0450   GLUCOSE 135* 07/23/2015 0450   BUN 7 07/23/2015 0450   BUN 16 09/28/2014   CREATININE 0.67 07/23/2015 0450   CALCIUM 9.1 07/23/2015 0450   PROT 7.5 07/10/2015 1507   ALBUMIN 3.6 07/10/2015 1507   AST 17 07/10/2015 1507   ALT 31 07/10/2015 1507   ALKPHOS 166* 07/10/2015 1507   BILITOT 0.6 07/10/2015 1507   GFRNONAA >60 07/23/2015 0450   GFRAA >60 07/23/2015 0450    Assessment and Plan  MENTAL STATUS CHANGE- unfortunately pt's last  presentations to the hospital looked just like this and impossible to get a timely work-up in this SNF; send to hospital.  Time spent > 35 min;> 50% of time with patient was spent reviewing records, labs, tests and studies, counseling and developing plan of care  Hennie Duos, MD

## 2015-11-07 NOTE — ED Notes (Signed)
Bed: WA02 Expected date:  Expected time:  Means of arrival:  Comments: Altered mental status

## 2015-11-07 NOTE — ED Notes (Signed)
Patient is a difficult stick. 3 attempts for peripheral IV. Patient has required PICC lines in the past. IV team consult ordered.

## 2015-11-07 NOTE — ED Notes (Signed)
Bed: [id number] Expected date:  Expected time:  Means of arrival:  Comments: EMS/elderly/lethargic

## 2015-11-07 NOTE — ED Provider Notes (Signed)
CSN: [id number]     Arrival date & time 11/07/15  1313 History   First MD Initiated Contact with Patient 11/07/15 1333     Chief Complaint  Patient presents with  . Altered Mental Status     (Consider location/radiation/quality/duration/timing/severity/associated sxs/prior Treatment) HPI Comments: 55 year old female with history of MS, hypertension was brought from her skilled nursing facility for change in mental status. The patient was found to not be herself mentally this morning when she was first seen. She reportedly is normally conversant and is able to hold normal conversation but this morning was very confused. She is usually oriented but was not. No reported fevers but the facility says that she is often like this when she is septic. No known trauma or falls. Patient has a neurogenic bladder but does not have a Foley in place. Patient is not able to give any history at this time.   Past Medical History  Diagnosis Date  . MS (multiple sclerosis) ([place])     a. Dx'd late 20's. b. Tx with Novantrone, Tysabri, Copaxone previously.  Marland Kitchen Hypertension   . Depression   . Glaucoma   . Cardiomyopathy ([place])     a.  Echo 04/29/12: Mild LVH, EF 20-25%, mild AI, moderate MR, moderate LAE, mild RAE, mild RVE, moderate TR, PASP 51, small pericardial effusion;   b. probably non-ischemic given multiple chemo-Tx agents used for MS and global LV dysfn on echo  . Chronic systolic heart failure [facility])    Past Surgical History  Procedure Laterality Date  . Ablation      uterine  . Cesarean section     Family History  Problem Relation Age of Onset  . Hypertension Mother   . Heart attack Neg Hx   . Cancer Mother     breast   . Cancer Father     prostate  . Multiple sclerosis Sister    Social History  Substance Use Topics  . Smoking status: Former Research scientist (life sciences)  . Smokeless tobacco: Never Used  . Alcohol Use: No   OB History    No data available     Review of Systems  Unable to perform ROS:  Dementia      Allergies  Sulfonamide derivatives and Penicillins  Home Medications   Prior to Admission medications   Medication Sig Start Date End Date Taking? Authorizing Provider  acetaminophen (TYLENOL) 325 MG tablet Take 2 tablets (650 mg total) by mouth every 6 (six) hours as needed for mild pain or fever (or Fever >/= 101). 07/07/15  Yes Robbie Lis, MD  amantadine (SYMMETREL) 100 MG capsule Take 100 mg by mouth daily.    Yes Historical Provider, MD  baclofen (LIORESAL) 20 MG tablet Take 20 mg by mouth 4 (four) times daily.   Yes Historical Provider, MD  carvedilol (COREG) 25 MG tablet Take 1 tablet (25 mg total) by mouth 2 (two) times daily with a meal. 07/25/15  Yes Ripudeep K Rai, MD  Cholecalciferol (VITAMIN D-3 PO) Take 1,000 Int'l Units/day by mouth daily.    Yes Historical Provider, MD  CRANBERRY PO Take 100 mg by mouth daily.   Yes Historical Provider, MD  Cyanocobalamin (VITAMIN B-12 PO) Take 1 tablet by mouth daily.   Yes Historical Provider, MD  DULoxetine (CYMBALTA) 60 MG capsule Take 60 mg by mouth daily.   Yes Historical Provider, MD  famotidine (PEPCID) 20 MG tablet Take 1 tablet (20 mg total) by mouth 2 (two) times daily. 07/25/15  Yes  Ripudeep Krystal Eaton, MD  feeding supplement, ENSURE ENLIVE, (ENSURE ENLIVE) LIQD Take 237 mLs by mouth 2 (two) times daily as needed (Please provide to pt if she she eats less than 50% of breakfast and dinner). 07/07/15  Yes Robbie Lis, MD  fluconazole (DIFLUCAN) 150 MG tablet Take 150 mg by mouth daily.   Yes Historical Provider, MD  furosemide (LASIX) 80 MG tablet Take 1 tablet (80 mg total) by mouth daily. 07/25/15  Yes Ripudeep Krystal Eaton, MD  gabapentin (NEURONTIN) 300 MG capsule Take 900 mg by mouth 3 (three) times daily.    Yes Historical Provider, MD  latanoprost (XALATAN) 0.005 % ophthalmic solution Place 1 drop into both eyes at bedtime.   Yes Historical Provider, MD  magnesium hydroxide (MILK OF MAGNESIA) 400 MG/5ML suspension Take 30  mLs by mouth every 8 (eight) hours as needed for mild constipation or moderate constipation.   Yes Historical Provider, MD  mirabegron ER (MYRBETRIQ) 25 MG TB24 tablet Take 25 mg by mouth at bedtime.   Yes Historical Provider, MD  ondansetron (ZOFRAN) 4 MG tablet Take 1 tablet (4 mg total) by mouth every 6 (six) hours as needed for nausea. 07/07/15  Yes Robbie Lis, MD  polyethylene glycol [facility] / Floria Raveling) packet Take 17 g by mouth daily as needed for mild constipation. Patient taking differently: Take 17 g by mouth 2 (two) times daily.  07/25/15  Yes Ripudeep Krystal Eaton, MD  potassium chloride (KLOR-CON) 20 MEQ packet Take 20 mEq by mouth daily.    Yes Historical Provider, MD  Propylene Glycol (SYSTANE BALANCE) 0.6 % SOLN Place 1 drop into both eyes 2 (two) times daily.   Yes Historical Provider, MD  senna-docusate (SENOKOT S) 8.6-50 MG tablet Take 1 tablet by mouth 2 (two) times daily.   Yes Historical Provider, MD  spironolactone (ALDACTONE) 25 MG tablet Take 25 mg by mouth daily. For CHF   Yes Historical Provider, MD  tamsulosin (FLOMAX) 0.4 MG CAPS capsule Take 0.4 mg by mouth daily. For incontinence   Yes Historical Provider, MD   BP 136/65 mmHg  Pulse 75  Temp(Src) 97.1 F (36.2 C) (Rectal)  Resp 14  SpO2 99% Physical Exam  Constitutional: No distress.  HENT:  Head: Normocephalic and atraumatic.  Right Ear: External ear normal.  Left Ear: External ear normal.  Mouth/Throat: Oropharynx is clear and moist. No oropharyngeal exudate.  Eyes: Pupils are equal, round, and reactive to light.  Neck: Normal range of motion. Neck supple.  Cardiovascular: Normal rate, regular rhythm, normal heart sounds and intact distal pulses.   No murmur heard. Pulmonary/Chest: Effort normal. No respiratory distress. She has no wheezes. She has no rales.  Abdominal: Soft. She exhibits no distension. There is no tenderness. There is no rebound.  Musculoskeletal: Normal range of motion. She exhibits no edema.   Neurological: She is alert.  Patient confused. Not answering simple questions. Not following commands.  Skin: Skin is warm and dry. No rash noted. She is not diaphoretic.  Vitals reviewed.   ED Course  Procedures (including critical care time) Labs Review Labs Reviewed  CBC WITH DIFFERENTIAL/PLATELET - Abnormal; Notable for the following:    Hemoglobin 11.6 (*)    HCT 35.6 (*)    RDW 16.8 (*)    Eosinophils Absolute 0.8 (*)    All other components within normal limits  COMPREHENSIVE METABOLIC PANEL - Abnormal; Notable for the following:    Glucose, Bld 116 (*)    BUN 23 (*)  Creatinine, Ser 1.23 (*)    Total Protein 8.4 (*)    Alkaline Phosphatase 153 (*)    GFR calc non Af Amer 50 (*)    GFR calc Af Amer 57 (*)    All other components within normal limits  URINALYSIS, ROUTINE W REFLEX MICROSCOPIC (NOT AT [facility]) - Abnormal; Notable for the following:    APPearance CLOUDY (*)    Hgb urine dipstick SMALL (*)    Protein, ur 30 (*)    Leukocytes, UA LARGE (*)    All other components within normal limits  URINE MICROSCOPIC-ADD ON - Abnormal; Notable for the following:    Squamous Epithelial / LPF 0-5 (*)    Bacteria, UA MANY (*)    All other components within normal limits  CULTURE, BLOOD (ROUTINE X 2)  CULTURE, BLOOD (ROUTINE X 2)  URINE CULTURE  I-STAT CG4 LACTIC ACID, ED  I-STAT TROPOININ, ED  I-STAT CG4 LACTIC ACID, ED    Imaging Review Dg Chest 2 View  11/07/2015  CLINICAL DATA:  Altered mental status. EXAM: CHEST  2 VIEW COMPARISON:  July 20, 2015. FINDINGS: The heart size and mediastinal contours are within normal limits. Both lungs are clear. No pneumothorax or pleural effusion is noted. The visualized skeletal structures are unremarkable. IMPRESSION: No active cardiopulmonary disease. Electronically Signed   By: Marijo Conception, M.D.   On: 11/07/2015 15:11   Ct Head Wo Contrast  11/07/2015  CLINICAL DATA:  Altered mental status. EXAM: CT HEAD WITHOUT CONTRAST  TECHNIQUE: Contiguous axial images were obtained from the base of the skull through the vertex without contrast. COMPARISON:  07/11/2015 and 07/10/2015 FINDINGS: Stable low-density in the periventricular white matter suggesting chronic changes. No evidence for acute hemorrhage, mass lesion, midline shift, hydrocephalus or large infarct. Extensive mucosal disease in the right maxillary sinus. Visualized mastoid air cells are clear. No calvarial fracture. IMPRESSION: No acute intracranial abnormality. White matter changes are suggestive for chronic small vessel ischemic disease. Right maxillary sinus disease. Electronically Signed   By: Markus Daft M.D.   On: 11/07/2015 15:19   I have personally reviewed and evaluated these images and lab results as part of my medical decision-making.   EKG Interpretation   Date/Time:  Monday Nov 07 2015 14:29:40 EDT Ventricular Rate:  71 PR Interval:  170 QRS Duration: 93 QT Interval:  417 QTC Calculation: 453 R Axis:   10 Text Interpretation:  Sinus rhythm No significant change since last  tracing Confirmed by Rector Devonshire ([id number]) on 11/07/2015 3:58:43 PM      MDM  Patient was seen and evaluated in stable condition. Patient with change in mental status. She is not able to Give any history or on a conversation. Laboratory results and initially unremarkable other than acute kidney injury but UA still pending. CT head and chest x-ray unremarkable.  Case signed out to evening team pending UA results. Final diagnoses:  None    1. Encephalopathy  2. AKI    Harvel Quale, MD 11/07/15 [id number]

## 2015-11-07 NOTE — Progress Notes (Signed)
Pharmacy Antibiotic Note  Shannon Garrett is a 55 y.o. female admitted on 11/07/2015 with UTI.  Pharmacy has been consulted for Cefepime dosing.  Plan: Cefepime 2 gm IV q12h     Temp (24hrs), Avg:98 F (36.7 C), Min:97.1 F (36.2 C), Max:98.6 F (37 C)   Recent Labs Lab 11/07/15 1406 11/07/15 1430 11/07/15 1737  WBC 7.5  --   --   CREATININE 1.23*  --   --   LATICACIDVEN  --  1.68 1.30    Estimated Creatinine Clearance: 62.9 mL/min (by C-G formula based on Cr of 1.23).    Allergies  Allergen Reactions  . Sulfonamide Derivatives Hives and Shortness Of Breath  . Penicillins Hives and Itching    Has patient had a PCN reaction causing immediate rash, facial/tongue/throat swelling, SOB or lightheadedness with hypotension: unknown Has patient had a PCN reaction causing severe rash involving mucus membranes or skin necrosis: unknown Has patient had a PCN reaction that required hospitalization: unknown Has patient had a PCN reaction occurring within the last 10 years: unknown If all of the above answers are "NO", then may proceed with Cephalosporin use. Prior course of rocephin charted 05/2015    Antimicrobials this admission: 5/8 cefepime >>    >>   Dose adjustments this admission:   Microbiology results:  BCx:   UCx:    Sputum:   MRSA PCR:  Thank you for allowing pharmacy to be a part of this patient's care.  Dorrene German 11/07/2015 11:26 PM

## 2015-11-07 NOTE — ED Notes (Signed)
Bed: [facility] Expected date:  Expected time:  Means of arrival:  Comments: 

## 2015-11-07 NOTE — ED Provider Notes (Addendum)
CSN: [id number]     Arrival date & time 11/07/15  2125 History   First MD Initiated Contact with Patient 11/07/15 2205     No chief complaint on file.    (Consider location/radiation/quality/duration/timing/severity/associated sxs/prior Treatment) HPI   55 year old female with altered mental status. Patient was seen by one of my partners in the emergency room earlier today for the same and signed out at change of shift to myself. Urinalysis subsequently came back consistent with UTI. It was felt that urinary tract infection was the most likely cause of her altered mental status. She has had similar presentations previously. She was afebrile. Hemodynamically stable. Creatinine was mildly elevated and about twice her baseline. She was given IV fluids. CT head and CXR were w/o acute findings. Lactic acid was normal. Sulfa and PCN allergies so she was given IV cpiro and discharged with prescription for the same. Although she was not at her baseline, she is coming from a SNF and I felt she could appropriately be further treated there. They apparently felt like she was too confused and were not comfortable with this so subsequently sent her back to the ED. Her exam is currently similar to the time she was previously discharged. She does not provide any further history.   Past Medical History  Diagnosis Date  . MS (multiple sclerosis) ([place])     a. Dx'd late 20's. b. Tx with Novantrone, Tysabri, Copaxone previously.  Marland Kitchen Hypertension   . Depression   . Glaucoma   . Cardiomyopathy ([place])     a.  Echo 04/29/12: Mild LVH, EF 20-25%, mild AI, moderate MR, moderate LAE, mild RAE, mild RVE, moderate TR, PASP 51, small pericardial effusion;   b. probably non-ischemic given multiple chemo-Tx agents used for MS and global LV dysfn on echo  . Chronic systolic heart failure [facility])    Past Surgical History  Procedure Laterality Date  . Ablation      uterine  . Cesarean section     Family History  Problem  Relation Age of Onset  . Hypertension Mother   . Heart attack Neg Hx   . Cancer Mother     breast   . Cancer Father     prostate  . Multiple sclerosis Sister    Social History  Substance Use Topics  . Smoking status: Former Research scientist (life sciences)  . Smokeless tobacco: Never Used  . Alcohol Use: No   OB History    No data available     Review of Systems  Level V caveat because she is essentially nonverbal.  Allergies  Sulfonamide derivatives and Penicillins  Home Medications   Prior to Admission medications   Medication Sig Start Date End Date Taking? Authorizing Provider  acetaminophen (TYLENOL) 325 MG tablet Take 2 tablets (650 mg total) by mouth every 6 (six) hours as needed for mild pain or fever (or Fever >/= 101). 07/07/15   Robbie Lis, MD  amantadine (SYMMETREL) 100 MG capsule Take 100 mg by mouth daily.     Historical Provider, MD  baclofen (LIORESAL) 20 MG tablet Take 20 mg by mouth 4 (four) times daily.    Historical Provider, MD  carvedilol (COREG) 25 MG tablet Take 1 tablet (25 mg total) by mouth 2 (two) times daily with a meal. 07/25/15   Ripudeep K Rai, MD  Cholecalciferol (VITAMIN D-3 PO) Take 1,000 Int'l Units/day by mouth daily.     Historical Provider, MD  ciprofloxacin (CIPRO) 500 MG tablet Take 1 tablet (500  mg total) by mouth every 12 (twelve) hours. 11/07/15   Virgel Manifold, MD  CRANBERRY PO Take 100 mg by mouth daily.    Historical Provider, MD  Cyanocobalamin (VITAMIN B-12 PO) Take 1 tablet by mouth daily.    Historical Provider, MD  DULoxetine (CYMBALTA) 60 MG capsule Take 60 mg by mouth daily.    Historical Provider, MD  famotidine (PEPCID) 20 MG tablet Take 1 tablet (20 mg total) by mouth 2 (two) times daily. 07/25/15   Ripudeep Krystal Eaton, MD  feeding supplement, ENSURE ENLIVE, (ENSURE ENLIVE) LIQD Take 237 mLs by mouth 2 (two) times daily as needed (Please provide to pt if she she eats less than 50% of breakfast and dinner). 07/07/15   Robbie Lis, MD  fluconazole  (DIFLUCAN) 150 MG tablet Take 150 mg by mouth daily.    Historical Provider, MD  furosemide (LASIX) 80 MG tablet Take 1 tablet (80 mg total) by mouth daily. 07/25/15   Ripudeep Krystal Eaton, MD  gabapentin (NEURONTIN) 300 MG capsule Take 900 mg by mouth 3 (three) times daily.     Historical Provider, MD  latanoprost (XALATAN) 0.005 % ophthalmic solution Place 1 drop into both eyes at bedtime.    Historical Provider, MD  magnesium hydroxide (MILK OF MAGNESIA) 400 MG/5ML suspension Take 30 mLs by mouth every 8 (eight) hours as needed for mild constipation or moderate constipation.    Historical Provider, MD  mirabegron ER (MYRBETRIQ) 25 MG TB24 tablet Take 25 mg by mouth at bedtime.    Historical Provider, MD  ondansetron (ZOFRAN) 4 MG tablet Take 1 tablet (4 mg total) by mouth every 6 (six) hours as needed for nausea. 07/07/15   Robbie Lis, MD  polyethylene glycol [facility] / Floria Raveling) packet Take 17 g by mouth daily as needed for mild constipation. Patient taking differently: Take 17 g by mouth 2 (two) times daily.  07/25/15   Ripudeep Krystal Eaton, MD  potassium chloride (KLOR-CON) 20 MEQ packet Take 20 mEq by mouth daily.     Historical Provider, MD  Propylene Glycol (SYSTANE BALANCE) 0.6 % SOLN Place 1 drop into both eyes 2 (two) times daily.    Historical Provider, MD  senna-docusate (SENOKOT S) 8.6-50 MG tablet Take 1 tablet by mouth 2 (two) times daily.    Historical Provider, MD  spironolactone (ALDACTONE) 25 MG tablet Take 25 mg by mouth daily. For CHF    Historical Provider, MD  tamsulosin (FLOMAX) 0.4 MG CAPS capsule Take 0.4 mg by mouth daily. For incontinence    Historical Provider, MD   BP 133/68 mmHg  Pulse 68  Temp(Src) 98.6 F (37 C) (Axillary)  Resp 18  SpO2 97% Physical Exam  HENT:  Head: Normocephalic and atraumatic.  Eyes: Conjunctivae are normal. Right eye exhibits no discharge. Left eye exhibits no discharge.  Neck: Neck supple.  Cardiovascular: Normal rate, regular rhythm and normal  heart sounds.  Exam reveals no gallop and no friction rub.   No murmur heard. Pulmonary/Chest: Effort normal and breath sounds normal. No respiratory distress.  Abdominal: Soft. She exhibits no distension. There is no tenderness.  Musculoskeletal: She exhibits no edema or tenderness.  Neurological: She is alert.  Makes eye contact. Responds to questioning mostly with moaning and sometimes says "yeah." Not following commands.   Skin: Skin is warm and dry.  Psychiatric: She has a normal mood and affect. Her behavior is normal. Thought content normal.  Nursing note and vitals reviewed.   ED Course  Procedures (including critical care time) Labs Review Labs Reviewed - No data to display  Imaging Review Dg Chest 2 View  11/07/2015  CLINICAL DATA:  Altered mental status. EXAM: CHEST  2 VIEW COMPARISON:  July 20, 2015. FINDINGS: The heart size and mediastinal contours are within normal limits. Both lungs are clear. No pneumothorax or pleural effusion is noted. The visualized skeletal structures are unremarkable. IMPRESSION: No active cardiopulmonary disease. Electronically Signed   By: Marijo Conception, M.D.   On: 11/07/2015 15:11   Ct Head Wo Contrast  11/07/2015  CLINICAL DATA:  Altered mental status. EXAM: CT HEAD WITHOUT CONTRAST TECHNIQUE: Contiguous axial images were obtained from the base of the skull through the vertex without contrast. COMPARISON:  07/11/2015 and 07/10/2015 FINDINGS: Stable low-density in the periventricular white matter suggesting chronic changes. No evidence for acute hemorrhage, mass lesion, midline shift, hydrocephalus or large infarct. Extensive mucosal disease in the right maxillary sinus. Visualized mastoid air cells are clear. No calvarial fracture. IMPRESSION: No acute intracranial abnormality. White matter changes are suggestive for chronic small vessel ischemic disease. Right maxillary sinus disease. Electronically Signed   By: Markus Daft M.D.   On: 11/07/2015  15:19   I have personally reviewed and evaluated these images and lab results as part of my medical decision-making.   EKG Interpretation None      MDM   Final diagnoses:  1. Urinary Tract Infection 2. Altered Mental Status    54yF with AMS likely 2/2 UTI. Abx. Admit.     Virgel Manifold, MD 11/10/15 [place], MD 12/08/15 [phone]

## 2015-11-07 NOTE — H&P (Addendum)
History and Physical    Shannon Garrett [id number] DOB: 1961-02-02 DOA: 11/07/2015  Referring MD/NP/PA: Dr. Wilson Singer PCP: Hennie Duos, MD    Outpatient Specialists: Dr. Arlice Colt, Neurology Patient coming from: [facility]   Chief Complaint: Altered mental status  HPI: Shannon Garrett is a 55 y.o. female with medical history significant of MS, HTN, depression, cardiomyopathy, neurogenic blad is asder, and recurrent UTIs; who presents with altered mental status.  History is obtained per review of records that the patient has been nonverbal at this time. Patient was noted to be less conversant at [facility] senior care living facility only noted to be staring off and intermittently moaning.  She was not following commands. At baseline, patient is usually very alert and able to converse normally.  Appears in review of previous records patient's had multiple urinary tract infections in the past.    ED Course: Upon admission patient was evaluated and seen to have vital signs within normal limits now, except for some tachypnea on initial presentation. Lab work revealed WBC 7.5, hemoglobin 11.6, platelets 346, sodium 142, potassium 4.5, chloride 104, bicarbonate 27, BUN 23, creatinine 1.23, calcium 9.5, glucose 116, alkaline phosphatase 158. Chest x-ray showed no acute abnormalities. CT of the brain showed no acute signs of stroke. Patient had been initially discharged back to the facility; however the facility refused acceptance of the patient and the patient was brought back to the hospital. Prior to the patient's initial discharge she was treated with ciprofloxacin. UA came back as positive for many bacteria, large leukocytes, negative nitrites, small hemoglobin, and too numerous to count WBCs.   Review of Systems: As per HPI otherwise 10 point review of systems negative.    Past Medical History  Diagnosis Date  . MS (multiple sclerosis) ([place])     a. Dx'd late 20's. b. Tx with  Novantrone, Tysabri, Copaxone previously.  Marland Kitchen Hypertension   . Depression   . Glaucoma   . Cardiomyopathy ([place])     a.  Echo 04/29/12: Mild LVH, EF 20-25%, mild AI, moderate MR, moderate LAE, mild RAE, mild RVE, moderate TR, PASP 51, small pericardial effusion;   b. probably non-ischemic given multiple chemo-Tx agents used for MS and global LV dysfn on echo  . Chronic systolic heart failure [facility])     Past Surgical History  Procedure Laterality Date  . Ablation      uterine  . Cesarean section       reports that she has quit smoking. She has never used smokeless tobacco. She reports that she does not drink alcohol or use illicit drugs.  Allergies  Allergen Reactions  . Sulfonamide Derivatives Hives and Shortness Of Breath  . Penicillins Hives and Itching    Has patient had a PCN reaction causing immediate rash, facial/tongue/throat swelling, SOB or lightheadedness with hypotension: unknown Has patient had a PCN reaction causing severe rash involving mucus membranes or skin necrosis: unknown Has patient had a PCN reaction that required hospitalization: unknown Has patient had a PCN reaction occurring within the last 10 years: unknown If all of the above answers are "NO", then may proceed with Cephalosporin use. Prior course of rocephin charted 05/2015    Family History  Problem Relation Age of Onset  . Hypertension Mother   . Heart attack Neg Hx   . Cancer Mother     breast   . Cancer Father     prostate  . Multiple sclerosis Sister     Prior to Admission  medications   Medication Sig Start Date End Date Taking? Authorizing Provider  acetaminophen (TYLENOL) 325 MG tablet Take 2 tablets (650 mg total) by mouth every 6 (six) hours as needed for mild pain or fever (or Fever >/= 101). 07/07/15   Robbie Lis, MD  amantadine (SYMMETREL) 100 MG capsule Take 100 mg by mouth daily.     Historical Provider, MD  baclofen (LIORESAL) 20 MG tablet Take 20 mg by mouth 4 (four) times daily.     Historical Provider, MD  carvedilol (COREG) 25 MG tablet Take 1 tablet (25 mg total) by mouth 2 (two) times daily with a meal. 07/25/15   Ripudeep K Rai, MD  Cholecalciferol (VITAMIN D-3 PO) Take 1,000 Int'l Units/day by mouth daily.     Historical Provider, MD  ciprofloxacin (CIPRO) 500 MG tablet Take 1 tablet (500 mg total) by mouth every 12 (twelve) hours. 11/07/15   Virgel Manifold, MD  CRANBERRY PO Take 100 mg by mouth daily.    Historical Provider, MD  Cyanocobalamin (VITAMIN B-12 PO) Take 1 tablet by mouth daily.    Historical Provider, MD  DULoxetine (CYMBALTA) 60 MG capsule Take 60 mg by mouth daily.    Historical Provider, MD  famotidine (PEPCID) 20 MG tablet Take 1 tablet (20 mg total) by mouth 2 (two) times daily. 07/25/15   Ripudeep Krystal Eaton, MD  feeding supplement, ENSURE ENLIVE, (ENSURE ENLIVE) LIQD Take 237 mLs by mouth 2 (two) times daily as needed (Please provide to pt if she she eats less than 50% of breakfast and dinner). 07/07/15   Robbie Lis, MD  fluconazole (DIFLUCAN) 150 MG tablet Take 150 mg by mouth daily.    Historical Provider, MD  furosemide (LASIX) 80 MG tablet Take 1 tablet (80 mg total) by mouth daily. 07/25/15   Ripudeep Krystal Eaton, MD  gabapentin (NEURONTIN) 300 MG capsule Take 900 mg by mouth 3 (three) times daily.     Historical Provider, MD  latanoprost (XALATAN) 0.005 % ophthalmic solution Place 1 drop into both eyes at bedtime.    Historical Provider, MD  magnesium hydroxide (MILK OF MAGNESIA) 400 MG/5ML suspension Take 30 mLs by mouth every 8 (eight) hours as needed for mild constipation or moderate constipation.    Historical Provider, MD  mirabegron ER (MYRBETRIQ) 25 MG TB24 tablet Take 25 mg by mouth at bedtime.    Historical Provider, MD  ondansetron (ZOFRAN) 4 MG tablet Take 1 tablet (4 mg total) by mouth every 6 (six) hours as needed for nausea. 07/07/15   Robbie Lis, MD  polyethylene glycol [facility] Floria Raveling) packet Take 17 g by mouth daily as needed for mild  constipation. Patient taking differently: Take 17 g by mouth 2 (two) times daily.  07/25/15   Ripudeep Krystal Eaton, MD  potassium chloride (KLOR-CON) 20 MEQ packet Take 20 mEq by mouth daily.     Historical Provider, MD  Propylene Glycol (SYSTANE BALANCE) 0.6 % SOLN Place 1 drop into both eyes 2 (two) times daily.    Historical Provider, MD  senna-docusate (SENOKOT S) 8.6-50 MG tablet Take 1 tablet by mouth 2 (two) times daily.    Historical Provider, MD  spironolactone (ALDACTONE) 25 MG tablet Take 25 mg by mouth daily. For CHF    Historical Provider, MD  tamsulosin (FLOMAX) 0.4 MG CAPS capsule Take 0.4 mg by mouth daily. For incontinence    Historical Provider, MD    Physical Exam: Filed Vitals:   11/07/15 2135  BP:  133/68  Pulse: 68  Temp: 98.6 F (37 C)  TempSrc: Axillary  Resp: 18  SpO2: 97%      Constitutional: Alert, not following commands.  Filed Vitals:   11/07/15 2135  BP: 133/68  Pulse: 68  Temp: 98.6 F (37 C)  TempSrc: Axillary  Resp: 18  SpO2: 97%   Eyes: PERRL, lids and conjunctivae normal ENMT: Mucous membranes are Dry. Posterior pharynx clear of any exudate or lesions.Normal dentition.  Neck: normal, supple, no masses, no thyromegaly Respiratory: clear to auscultation bilaterally, no wheezing, no crackles. Normal respiratory effort. No accessory muscle use.  Cardiovascular: Regular rate and rhythm, no murmurs / rubs / gallops. No extremity edema. 2+ pedal pulses. No carotid bruits.   Abdomen:  Tenderness to palpation of the lower abdomen, no masses palpated. No hepatosplenomegaly. Bowel sounds positive.  Musculoskeletal: stiffness of the upper and lower extremities Skin: no rashes, lesions, ulcers. No induration Neurologic: Paraplegia  Psychiatric: Normal judgment and insight. Alert, but nonconversant.  Labs on Admission: I have personally reviewed following labs and imaging studies  CBC:  Recent Labs Lab 11/07/15 1406  WBC 7.5  NEUTROABS 3.8  HGB 11.6*   HCT 35.6*  MCV 82.2  PLT [id number]   Basic Metabolic Panel:  Recent Labs Lab 11/07/15 1406  NA 142  K 4.5  CL 104  CO2 27  GLUCOSE 116*  BUN 23*  CREATININE 1.23*  CALCIUM 9.5   GFR: Estimated Creatinine Clearance: 62.9 mL/min (by C-G formula based on Cr of 1.23). Liver Function Tests:  Recent Labs Lab 11/07/15 1406  AST 22  ALT 23  ALKPHOS 153*  BILITOT 0.5  PROT 8.4*  ALBUMIN 4.2   No results for input(s): LIPASE, AMYLASE in the last 168 hours. No results for input(s): AMMONIA in the last 168 hours. Coagulation Profile: No results for input(s): INR, PROTIME in the last 168 hours. Cardiac Enzymes: No results for input(s): CKTOTAL, CKMB, CKMBINDEX, TROPONINI in the last 168 hours. BNP (last 3 results) No results for input(s): PROBNP in the last 8760 hours. HbA1C: No results for input(s): HGBA1C in the last 72 hours. CBG: No results for input(s): GLUCAP in the last 168 hours. Lipid Profile: No results for input(s): CHOL, HDL, LDLCALC, TRIG, CHOLHDL, LDLDIRECT in the last 72 hours. Thyroid Function Tests: No results for input(s): TSH, T4TOTAL, FREET4, T3FREE, THYROIDAB in the last 72 hours. Anemia Panel: No results for input(s): VITAMINB12, FOLATE, FERRITIN, TIBC, IRON, RETICCTPCT in the last 72 hours. Urine analysis:    Component Value Date/Time   COLORURINE YELLOW 11/07/2015 1605   APPEARANCEUR CLOUDY* 11/07/2015 1605   LABSPEC 1.016 11/07/2015 1605   PHURINE 7.5 11/07/2015 1605   GLUCOSEU NEGATIVE 11/07/2015 1605   HGBUR SMALL* 11/07/2015 [place] 11/07/2015 [place] 11/07/2015 1605   PROTEINUR 30* 11/07/2015 1605   UROBILINOGEN 1.0 01/10/2015 2311   NITRITE NEGATIVE 11/07/2015 1605   LEUKOCYTESUR LARGE* 11/07/2015 1605   Sepsis Labs:  No results found for this or any previous visit (from the past 240 hour(s)).   Radiological Exams on Admission: Dg Chest 2 View  11/07/2015  CLINICAL DATA:  Altered mental status.  EXAM: CHEST  2 VIEW COMPARISON:  July 20, 2015. FINDINGS: The heart size and mediastinal contours are within normal limits. Both lungs are clear. No pneumothorax or pleural effusion is noted. The visualized skeletal structures are unremarkable. IMPRESSION: No active cardiopulmonary disease. Electronically Signed   By: Marijo Conception, M.D.  On: 11/07/2015 15:11   Ct Head Wo Contrast  11/07/2015  CLINICAL DATA:  Altered mental status. EXAM: CT HEAD WITHOUT CONTRAST TECHNIQUE: Contiguous axial images were obtained from the base of the skull through the vertex without contrast. COMPARISON:  07/11/2015 and 07/10/2015 FINDINGS: Stable low-density in the periventricular white matter suggesting chronic changes. No evidence for acute hemorrhage, mass lesion, midline shift, hydrocephalus or large infarct. Extensive mucosal disease in the right maxillary sinus. Visualized mastoid air cells are clear. No calvarial fracture. IMPRESSION: No acute intracranial abnormality. White matter changes are suggestive for chronic small vessel ischemic disease. Right maxillary sinus disease. Electronically Signed   By: Markus Daft M.D.   On: 11/07/2015 15:19    EKG: Independently reviewed. Normal sinus rhythm.   Assessment/Plan Acute encephalopathy: Patient with decreased mentation noted at the nursing facility in which she resides. Question of cause of patient's underlying acute encephalopathy secondary to her underlying urinary tract infection - Admit to MedSurg bed - Check ammonia level and blood cultures x 2 - Nursing to check bedside swallow evaluation, may need to place Dobbhoff tube/NGT if unable to pass  swallow - Continue to monitor mental status  Urinary tract infection: Acute. Likely healthcare associated as the patient currently resides in a nursing facility and has been in the hospital within the last 30 days. - Follow-up urine culture. - Antibiotics of cefepime, and consider adjust antibiotics as  needed.  Acute kidney injury: Patient with a baseline creatinine of 0.67, but acutely elevated on admission to 1.23 with a BUN of 23. Due to the elevated BUN/creatinine ratio suspect prerenal in nature. - Normal saline IV fluids - check FeUr - Held Lasix - Recheck CMP in a.m.  Elevated transaminases: Alkaline phosphatase elevated at 158 on admission. - Repeat CMP in a.m.  Anemia: Chronic. Hemoglobin noted to be 11.6 - Continue to monitor  Essential hypertension: Stable. - Continue Coreg  - Held Lasix and potassium chloride 2/2 AKI  Diastolic dysfunction: Last EF noted to be 60-65% with grade 1 diastolic dysfunction. - Strict ins and outs - continue spironolactone  Multiple sclerosis with paraplegia : Stable.  - Continue gabapentin( adjust dose for renal function), amantadine - Changed to low air mattress   Neurogenic Bladder  - continue Flomax - held Myrbetriq 2/2 AKI   - bladder scan q shift Place Foley if greater than 300 mL of urine present  GERD - continuen Pepcid  Depression - Continue Cymbalta    DVT prophylaxis: Lovenox  Code Status: DO NOT RESUSCITATE Family Communication: None Disposition Plan: Likely discharged back home to nursing facility in 1-2 days Consults called: None Admission status: Observation MedSurg   Norval Morton MD [facility] Pager [phone]  If 7PM-7AM, please contact night-coverage [email or web address] Password [facility]  11/07/2015, 10:29 PM

## 2015-11-07 NOTE — ED Notes (Signed)
[facility] called for transport

## 2015-11-07 NOTE — ED Notes (Signed)
Patient was seen and discharged an hour ago for altered mental status. It was determined at that visit that the patient had a urinary tract infection. The patient was treated here with antibiotics and was discharged with a prescription with antibiotics. Upon arrival to the facility the nursing staff refused to receive the patient due to her mental status.

## 2015-11-07 NOTE — ED Notes (Signed)
Patient from [facility] with altered mental status which started today upon awakening.  Patient is normally alert and oriented but is only moaning and cannot respond verbally now.  Patient is a paraplegic from [place] and has a history of a neurogenic bladder.  History of sepsis and usually presents with altered mental status.  BP-120/64 CBG-140.

## 2015-11-07 NOTE — ED Notes (Signed)
Performed an In & out on Pt and was assisted with NT [place]

## 2015-11-08 DIAGNOSIS — Z87440 Personal history of urinary (tract) infections: Secondary | ICD-10-CM | POA: Diagnosis not present

## 2015-11-08 DIAGNOSIS — Z66 Do not resuscitate: Secondary | ICD-10-CM | POA: Diagnosis present

## 2015-11-08 DIAGNOSIS — R4182 Altered mental status, unspecified: Secondary | ICD-10-CM | POA: Diagnosis present

## 2015-11-08 DIAGNOSIS — G9340 Encephalopathy, unspecified: Secondary | ICD-10-CM | POA: Diagnosis present

## 2015-11-08 DIAGNOSIS — I69391 Dysphagia following cerebral infarction: Secondary | ICD-10-CM | POA: Diagnosis not present

## 2015-11-08 DIAGNOSIS — I429 Cardiomyopathy, unspecified: Secondary | ICD-10-CM | POA: Diagnosis present

## 2015-11-08 DIAGNOSIS — G629 Polyneuropathy, unspecified: Secondary | ICD-10-CM | POA: Diagnosis not present

## 2015-11-08 DIAGNOSIS — Z8249 Family history of ischemic heart disease and other diseases of the circulatory system: Secondary | ICD-10-CM | POA: Diagnosis not present

## 2015-11-08 DIAGNOSIS — E559 Vitamin D deficiency, unspecified: Secondary | ICD-10-CM | POA: Diagnosis not present

## 2015-11-08 DIAGNOSIS — E669 Obesity, unspecified: Secondary | ICD-10-CM | POA: Diagnosis present

## 2015-11-08 DIAGNOSIS — Z6837 Body mass index (BMI) 37.0-37.9, adult: Secondary | ICD-10-CM | POA: Diagnosis not present

## 2015-11-08 DIAGNOSIS — Z882 Allergy status to sulfonamides status: Secondary | ICD-10-CM | POA: Diagnosis not present

## 2015-11-08 DIAGNOSIS — N179 Acute kidney failure, unspecified: Secondary | ICD-10-CM | POA: Diagnosis present

## 2015-11-08 DIAGNOSIS — D649 Anemia, unspecified: Secondary | ICD-10-CM | POA: Diagnosis present

## 2015-11-08 DIAGNOSIS — Z792 Long term (current) use of antibiotics: Secondary | ICD-10-CM | POA: Diagnosis not present

## 2015-11-08 DIAGNOSIS — F919 Conduct disorder, unspecified: Secondary | ICD-10-CM | POA: Diagnosis not present

## 2015-11-08 DIAGNOSIS — R7989 Other specified abnormal findings of blood chemistry: Secondary | ICD-10-CM | POA: Diagnosis present

## 2015-11-08 DIAGNOSIS — N390 Urinary tract infection, site not specified: Secondary | ICD-10-CM | POA: Diagnosis present

## 2015-11-08 DIAGNOSIS — Z820 Family history of epilepsy and other diseases of the nervous system: Secondary | ICD-10-CM | POA: Diagnosis not present

## 2015-11-08 DIAGNOSIS — Z79899 Other long term (current) drug therapy: Secondary | ICD-10-CM | POA: Diagnosis not present

## 2015-11-08 DIAGNOSIS — B964 Proteus (mirabilis) (morganii) as the cause of diseases classified elsewhere: Secondary | ICD-10-CM | POA: Diagnosis present

## 2015-11-08 DIAGNOSIS — K219 Gastro-esophageal reflux disease without esophagitis: Secondary | ICD-10-CM | POA: Diagnosis not present

## 2015-11-08 DIAGNOSIS — N3000 Acute cystitis without hematuria: Secondary | ICD-10-CM | POA: Diagnosis not present

## 2015-11-08 DIAGNOSIS — I5042 Chronic combined systolic (congestive) and diastolic (congestive) heart failure: Secondary | ICD-10-CM

## 2015-11-08 DIAGNOSIS — K5909 Other constipation: Secondary | ICD-10-CM | POA: Diagnosis present

## 2015-11-08 DIAGNOSIS — Z87891 Personal history of nicotine dependence: Secondary | ICD-10-CM | POA: Diagnosis not present

## 2015-11-08 DIAGNOSIS — I5022 Chronic systolic (congestive) heart failure: Secondary | ICD-10-CM | POA: Diagnosis not present

## 2015-11-08 DIAGNOSIS — F329 Major depressive disorder, single episode, unspecified: Secondary | ICD-10-CM | POA: Diagnosis present

## 2015-11-08 DIAGNOSIS — E870 Hyperosmolality and hypernatremia: Secondary | ICD-10-CM | POA: Diagnosis present

## 2015-11-08 DIAGNOSIS — Z7951 Long term (current) use of inhaled steroids: Secondary | ICD-10-CM | POA: Diagnosis not present

## 2015-11-08 DIAGNOSIS — Z803 Family history of malignant neoplasm of breast: Secondary | ICD-10-CM | POA: Diagnosis not present

## 2015-11-08 DIAGNOSIS — N319 Neuromuscular dysfunction of bladder, unspecified: Secondary | ICD-10-CM | POA: Diagnosis present

## 2015-11-08 DIAGNOSIS — I110 Hypertensive heart disease with heart failure: Secondary | ICD-10-CM | POA: Diagnosis present

## 2015-11-08 DIAGNOSIS — Z880 Allergy status to penicillin: Secondary | ICD-10-CM | POA: Diagnosis not present

## 2015-11-08 DIAGNOSIS — G35 Multiple sclerosis: Secondary | ICD-10-CM | POA: Diagnosis present

## 2015-11-08 LAB — COMPREHENSIVE METABOLIC PANEL
ALBUMIN: 4 g/dL (ref 3.5–5.0)
ALT: 20 U/L (ref 14–54)
ANION GAP: 10 (ref 5–15)
AST: 21 U/L (ref 15–41)
Alkaline Phosphatase: 144 U/L — ABNORMAL HIGH (ref 38–126)
BILIRUBIN TOTAL: 0.4 mg/dL (ref 0.3–1.2)
BUN: 23 mg/dL — AB (ref 6–20)
CHLORIDE: 110 mmol/L (ref 101–111)
CO2: 28 mmol/L (ref 22–32)
Calcium: 9.6 mg/dL (ref 8.9–10.3)
Creatinine, Ser: 1.24 mg/dL — ABNORMAL HIGH (ref 0.44–1.00)
GFR calc Af Amer: 57 mL/min — ABNORMAL LOW (ref >60)
GFR, EST NON AFRICAN AMERICAN: 49 mL/min — AB (ref >60)
Glucose, Bld: 130 mg/dL — ABNORMAL HIGH (ref 65–99)
POTASSIUM: 4.3 mmol/L (ref 3.5–5.1)
Sodium: 148 mmol/L — ABNORMAL HIGH (ref 135–145)
TOTAL PROTEIN: 7.8 g/dL (ref 6.5–8.1)

## 2015-11-08 LAB — CBC
HEMATOCRIT: 33.8 % — AB (ref 36.0–46.0)
Hemoglobin: 11 g/dL — ABNORMAL LOW (ref 12.0–15.0)
MCH: 26.6 pg (ref 26.0–34.0)
MCHC: 32.5 g/dL (ref 30.0–36.0)
MCV: 81.8 fL (ref 78.0–100.0)
PLATELETS: 322 10*3/uL (ref 150–400)
RBC: 4.13 MIL/uL (ref 3.87–5.11)
RDW: 16.9 % — ABNORMAL HIGH (ref 11.5–15.5)
WBC: 9.9 10*3/uL (ref 4.0–10.5)

## 2015-11-08 LAB — MRSA PCR SCREENING: MRSA BY PCR: NEGATIVE

## 2015-11-08 MED ORDER — SODIUM CHLORIDE 0.45 % IV SOLN
INTRAVENOUS | Status: DC
  Administered 2015-11-08 – 2015-11-09 (×3): via INTRAVENOUS

## 2015-11-08 MED ORDER — DEXTROSE 5 % IV SOLN
2.0000 g | Freq: Two times a day (BID) | INTRAVENOUS | Status: DC
  Administered 2015-11-08 – 2015-11-10 (×4): 2 g via INTRAVENOUS
  Filled 2015-11-08 (×4): qty 2

## 2015-11-08 NOTE — Progress Notes (Signed)
PROGRESS NOTE  Shannon Garrett [id number] DOB: Nov 09, 1960 DOA: 11/07/2015 PCP: Hennie Duos, MD  HPI/Recap of past 24 hours: Patient is a 55 year old female with past mental history of chronic systolic/diastolic heart failure, MS and previous CVA who resides in a skilled nursing facility and was brought in after noting for 1 day to be confused, not very verbal and intermittently moaning. (Patient's baseline is reportedly very alert and able to converse normally) in the emergency room, patient noted to have mildly elevated creatinine at 1.23 and a large urinary tract infection. CT scan of head unremarkable. Patient started on IV fluids and antibiotics and admitted to the hospitalist service. Follow-up labs on early morning of 5/9 noted increase in sodium to 148.  Patient seen and later morning. Still confused and not very verbal. Unable to give me any kind of review of systems. Patient seen by speech therapy who put her on dysphagia 1 diet  Assessment/Plan: Principal Problem:   Acute encephalopathy: Secondary to urinary tract infection, acute kidney injury and hypernatremia. Changed IV fluids to half-normal saline and treated with antibiotics Active Problems:   OBESITY: Patient meets criteria with BMI greater than 30   MULTIPLE SCLEROSIS, PROGRESSIVE/RELAPSING: Continue home medications   Chronic combined systolic and diastolic congestive heart failure ([place]): Will check BNP, appears euvolemic at this time   Neurogenic bladder   Depression   Essential hypertension   Abnormal LFTs   Chronic anemia   UTI (urinary tract infection): Awaiting cultures. Continue IV cefepime   Dysphagia as late effect of stroke: Seen by speech therapy, on dysphagia 1 diet   Code Status: DO NOT RESUSCITATE  Family Communication: Left message with daughter   Disposition Plan: Return back to skilled nursing once more back at baseline, hopefully by tomorrow    Consultants:  None  Procedures:  None    Antimicrobials:  IV cefepime 5/8-present   DVT prophylaxis: Lovenox   Objective: Filed Vitals:   11/08/15 0405 11/08/15 0438 11/08/15 0645 11/08/15 1356  BP:  133/76  153/97  Pulse: 87 94 95 110  Temp:  98.4 F (36.9 C)  98.7 F (37.1 C)  TempSrc:  Axillary  Oral  Resp: 16 16 18 18   Weight:      SpO2: 100% 100% 100% 100%    Intake/Output Summary (Last 24 hours) at 11/08/15 1626 Last data filed at 11/08/15 1521  Gross per 24 hour  Intake   1045 ml  Output      0 ml  Net   1045 ml   Filed Weights   11/08/15 0018  Weight: 99.1 kg (218 lb 7.6 oz)    Exam:   General:  Delirious, oriented 1?   Cardiovascular: Regular rate and rhythm, S1-S2   Respiratory: Clear to auscultation bilaterally   Abdomen: Soft, nontender, obese, hypoactive bowel sounds   Musculoskeletal: No clubbing or cyanosis or edema   Skin: No skin breaks, tears or lesions  Psychiatry: Delirious    Data Reviewed: CBC:  Recent Labs Lab 11/07/15 1406 11/08/15 0042  WBC 7.5 9.9  NEUTROABS 3.8  --   HGB 11.6* 11.0*  HCT 35.6* 33.8*  MCV 82.2 81.8  PLT 346 [id number]   Basic Metabolic Panel:  Recent Labs Lab 11/07/15 1406 11/08/15 0042  NA 142 148*  K 4.5 4.3  CL 104 110  CO2 27 28  GLUCOSE 116* 130*  BUN 23* 23*  CREATININE 1.23* 1.24*  CALCIUM 9.5 9.6   GFR: Estimated Creatinine Clearance: 61.8 mL/min (by  C-G formula based on Cr of 1.24). Liver Function Tests:  Recent Labs Lab 11/07/15 1406 11/08/15 0042  AST 22 21  ALT 23 20  ALKPHOS 153* 144*  BILITOT 0.5 0.4  PROT 8.4* 7.8  ALBUMIN 4.2 4.0   No results for input(s): LIPASE, AMYLASE in the last 168 hours.  Recent Labs Lab 11/07/15 2244  AMMONIA 22   Coagulation Profile: No results for input(s): INR, PROTIME in the last 168 hours. Cardiac Enzymes: No results for input(s): CKTOTAL, CKMB, CKMBINDEX, TROPONINI in the last 168 hours. BNP (last 3 results) No results for input(s): PROBNP in the last 8760  hours. HbA1C: No results for input(s): HGBA1C in the last 72 hours. CBG: No results for input(s): GLUCAP in the last 168 hours. Lipid Profile: No results for input(s): CHOL, HDL, LDLCALC, TRIG, CHOLHDL, LDLDIRECT in the last 72 hours. Thyroid Function Tests: No results for input(s): TSH, T4TOTAL, FREET4, T3FREE, THYROIDAB in the last 72 hours. Anemia Panel: No results for input(s): VITAMINB12, FOLATE, FERRITIN, TIBC, IRON, RETICCTPCT in the last 72 hours. Urine analysis:    Component Value Date/Time   COLORURINE YELLOW 11/07/2015 1605   APPEARANCEUR CLOUDY* 11/07/2015 1605   LABSPEC 1.016 11/07/2015 1605   PHURINE 7.5 11/07/2015 1605   GLUCOSEU NEGATIVE 11/07/2015 1605   HGBUR SMALL* 11/07/2015 1605   [place] 11/07/2015 [place] 11/07/2015 1605   PROTEINUR 30* 11/07/2015 1605   UROBILINOGEN 1.0 01/10/2015 2311   NITRITE NEGATIVE 11/07/2015 1605   LEUKOCYTESUR LARGE* 11/07/2015 1605   Sepsis Labs: @LABRCNTIP (procalcitonin:4,lacticidven:4)  ) Recent Results (from the past 240 hour(s))  Urine culture     Status: None (Preliminary result)   Collection Time: 11/07/15  4:05 PM  Result Value Ref Range Status   Specimen Description URINE, CATHETERIZED  Final   Special Requests NONE  Final   Culture   Final    TOO YOUNG TO READ Performed at [facility]    Report Status PENDING  Incomplete  MRSA PCR Screening     Status: None   Collection Time: 11/08/15  1:10 AM  Result Value Ref Range Status   MRSA by PCR NEGATIVE NEGATIVE Final    Comment:        The GeneXpert MRSA Assay (FDA approved for NASAL specimens only), is one component of a comprehensive MRSA colonization surveillance program. It is not intended to diagnose MRSA infection nor to guide or monitor treatment for MRSA infections.       Studies: No results found.  Scheduled Meds: . amantadine  100 mg Oral Daily  . carvedilol  25 mg Oral BID WC  . ceFEPime (MAXIPIME)  IV  2 g Intravenous Q12H  . cholecalciferol  1,000 Units Oral Daily  . DULoxetine  60 mg Oral Daily  . enoxaparin (LOVENOX) injection  40 mg Subcutaneous QHS  . famotidine  20 mg Oral BID  . gabapentin  700 mg Oral BID  . latanoprost  1 drop Both Eyes QHS  . polyethylene glycol  17 g Oral BID  . polyvinyl alcohol  1 drop Both Eyes BID  . senna-docusate  1 tablet Oral BID  . spironolactone  25 mg Oral Daily  . tamsulosin  0.4 mg Oral Daily  . vitamin B-12  1,000 mcg Oral Daily    Continuous Infusions: . sodium chloride 100 mL/hr at 11/08/15 [id number]       Time spent: 25 minutes  Annita Brod, MD [facility] Pager [phone]  If 7PM-7AM, please  contact night-coverage [email or web address] Password [email or web address] 11/08/2015, 4:26 PM

## 2015-11-08 NOTE — Plan of Care (Signed)
Problem: Education: Goal: Knowledge of [facility] Education information/materials will improve Outcome: Not Progressing Reviewed with patient. Pt nonverbal and confused at this time. No learning noted.  Problem: Health Behavior/Discharge Planning: Goal: Ability to manage health-related needs will improve Outcome: Not Progressing Reviewed with patient. Pt nonverbal and confused at this time. No learning noted.  Problem: Activity: Goal: Risk for activity intolerance will decrease Outcome: Not Progressing Reviewed with patient. Pt nonverbal and confused at this time. No learning noted.  Problem: Nutrition: Goal: Adequate nutrition will be maintained Outcome: Not Progressing Pt currently NPO. Reviewed with patient. Pt nonverbal and confused at this time. No learning noted.  Problem: Education: Goal: Knowledge of treatment and prevention of UTI/Pyleonephritis will improve Outcome: Not Progressing Reviewed with patient. Pt nonverbal and confused at this time. No learning noted.     Problem: Urinary Elimination: Goal: Signs and symptoms of infection will decrease Outcome: Not Progressing Reviewed with patient. Pt nonverbal and confused at this time. No learning noted.

## 2015-11-08 NOTE — Progress Notes (Signed)
Pt is from [facility]. CSW will follow to assist with d/c planning back to SNF when stable.  Werner Lean LCSW [phone]

## 2015-11-08 NOTE — Evaluation (Addendum)
Clinical/Bedside Swallow Evaluation Patient Details  Name: Shannon Garrett MRN: [id number] Date of Birth: 28-Nov-1960  Today's Date: 11/08/2015 Time: SLP Start Time (ACUTE ONLY): 1210 SLP Stop Time (ACUTE ONLY): 1225 SLP Time Calculation (min) (ACUTE ONLY): 15 min  Past Medical History:  Past Medical History  Diagnosis Date  . MS (multiple sclerosis) ([place])     a. Dx'd late 20's. b. Tx with Novantrone, Tysabri, Copaxone previously.  Marland Kitchen Hypertension   . Depression   . Glaucoma   . Cardiomyopathy ([place])     a.  Echo 04/29/12: Mild LVH, EF 20-25%, mild AI, moderate MR, moderate LAE, mild RAE, mild RVE, moderate TR, PASP 51, small pericardial effusion;   b. probably non-ischemic given multiple chemo-Tx agents used for MS and global LV dysfn on echo  . Chronic systolic heart failure [facility])    Past Surgical History:  Past Surgical History  Procedure Laterality Date  . Ablation      uterine  . Cesarean section     HPI:  55 y.o. female with medical history significant for MS, HTN, depression, cardiomyopathy, neurogenic bladder, and recurrent UTIs; who presents with altered mental status, UTI.    Assessment / Plan / Recommendation Clinical Impression  Pt with hx of transient dysphagia associated with MS changes.  Today, demonstrates inconsistent ability to f/c; nonverbal but phonates occasionally.  Presents with adequate attention to purees and water; brisk swallow response; no overt s/s of aspiration (passed 3 oz water test without deficit). Mild right CN asymmetry at rest.  Did not follow oral/motor commands. For today, recommend initiating a dysphagia 1 diet, thin liquids, meds whole in puree.  SLP will follow for safety/diet advancement as MS improves.       Aspiration Risk  Mild aspiration risk    Diet Recommendation   Dysphagia 1, thin liquids   Medication Administration: Whole meds with puree    Other  Recommendations Oral Care Recommendations: Oral care BID   Follow up  Recommendations   (tba)    Frequency and Duration min 2x/week  1 week       Prognosis Prognosis for Safe Diet Advancement: Good      Swallow Study   General Date of Onset: 11/07/15 HPI: 55 y.o. female with medical history significant for MS, HTN, depression, cardiomyopathy, neurogenic bladder, and recurrent UTIs; who presents with altered mental status, UTI. Type of Study: Bedside Swallow Evaluation Previous Swallow Assessment:  (multiple swallow evals prior admissions no significant findi) Diet Prior to this Study: NPO Temperature Spikes Noted: No Respiratory Status: Room air History of Recent Intubation: No Behavior/Cognition: Alert;Doesn't follow directions Oral Cavity Assessment: Dried secretions Oral Care Completed by SLP: Yes Oral Cavity - Dentition: Adequate natural dentition Self-Feeding Abilities: Total assist Patient Positioning: Upright in bed Baseline Vocal Quality: Not observed Volitional Cough: Cognitively unable to elicit Volitional Swallow: Unable to elicit    Oral/Motor/Sensory Function     Ice Chips Ice chips: Within functional limits Presentation: Spoon   Thin Liquid Thin Liquid: Within functional limits Presentation: Straw    Nectar Thick Nectar Thick Liquid: Not tested   Honey Thick Honey Thick Liquid: Not tested   Puree Puree: Within functional limits Presentation: [place]. [place], [place] CCC/SLP Pager [phone]  Solid: Not tested        Juan Quam Laurice 11/08/2015,12:39 PM

## 2015-11-08 NOTE — Progress Notes (Signed)
Pam, RN-[facility] RR, aware via phone of new order for swallow evaluation.

## 2015-11-08 NOTE — Progress Notes (Signed)
Spoke with daughter recently via phone. Confirmed contact number. Daughter unable to assist with remaining admission data base info. Pt non-verbal. Admission RN, Maudie Mercury, in to call SNF and completed admission.

## 2015-11-09 LAB — BASIC METABOLIC PANEL
ANION GAP: 11 (ref 5–15)
BUN: 22 mg/dL — ABNORMAL HIGH (ref 6–20)
CALCIUM: 9.1 mg/dL (ref 8.9–10.3)
CO2: 24 mmol/L (ref 22–32)
CREATININE: 1.07 mg/dL — AB (ref 0.44–1.00)
Chloride: 108 mmol/L (ref 101–111)
GFR calc Af Amer: >60 mL/min (ref >60)
GFR calc non Af Amer: 59 mL/min — ABNORMAL LOW (ref >60)
Glucose, Bld: 112 mg/dL — ABNORMAL HIGH (ref 65–99)
POTASSIUM: 4.1 mmol/L (ref 3.5–5.1)
SODIUM: 143 mmol/L (ref 135–145)

## 2015-11-09 MED ORDER — FUROSEMIDE 40 MG PO TABS
40.0000 mg | ORAL_TABLET | Freq: Every day | ORAL | Status: DC
  Administered 2015-11-09 – 2015-11-10 (×2): 40 mg via ORAL
  Filled 2015-11-09 (×2): qty 1

## 2015-11-09 MED ORDER — SPIRONOLACTONE 25 MG PO TABS
25.0000 mg | ORAL_TABLET | Freq: Every day | ORAL | Status: DC
  Administered 2015-11-09 – 2015-11-10 (×2): 25 mg via ORAL
  Filled 2015-11-09 (×2): qty 1

## 2015-11-09 NOTE — NC FL2 (Addendum)
[place] LEVEL OF CARE SCREENING TOOL     IDENTIFICATION  Patient Name: Shannon Garrett Birthdate: 1960-10-11 Sex: female Admission Date (Current Location): 11/07/2015  [facility] and [place] Number:  Herbalist and Address:  [facility],  [place] [address] [place]      Provider Number: [id number]  Attending Physician Name and Address:  Elmarie Shiley, MD  Relative Name and Phone Number:  Ronnette Askeland    Current Level of Care: Hospital Recommended Level of Care: [place] Prior Approval Number:    Date Approved/Denied:   PASRR Number:    Discharge Plan: SNF    Current Diagnoses: Patient Active Problem List   Diagnosis Date Noted  . UTI (urinary tract infection) 11/08/2015  . Dysphagia as late effect of stroke 11/08/2015  . Adrenal insufficiency (Addison's disease) ([place]) 08/12/2015  . Altered mental status   . Acute respiratory failure ([place])   . Gram-negative infection 07/06/2015  . Sepsis ([place]) 07/04/2015  . Vitamin D deficiency 06/18/2015  . Multiple sclerosis exacerbation ([place]) 06/08/2015  . Weakness 06/08/2015  . Chronic systolic (congestive) heart failure ([place]) 06/08/2015  . Acute encephalopathy 06/03/2015  . Acute kidney injury ([place]) 06/03/2015  . Hypokalemia 06/03/2015  . Sepsis secondary to UTI ([place]) 05/30/2015  . Hypernatremia 05/30/2015  . Dehydration 05/30/2015  . Chronic anemia 05/30/2015  . Chronic pain syndrome 03/03/2015  . Benign hypertensive heart disease without heart failure 01/18/2015  . Urinary tract infection due to Enterococcus   . Essential hypertension   . Abnormal LFTs   . Foot drop, bilateral   . Pressure ulcer 01/12/2015  . UTI (lower urinary tract infection) 01/11/2015  . Hypovolemia 01/11/2015  . UTI (urinary tract infection), uncomplicated   . Cardiomyopathy ([place])   . Urinary incontinence 01/07/2015  . Chronic constipation 11/26/2014  . Depression 11/26/2014  .  Tachycardia 02/20/2014  . Venous stasis ulcers ([place]) 02/20/2014  . Leg wound, left 11/04/2013  . FTT (failure to thrive) in adult 07/31/2013  . Medically noncompliant 07/31/2013  . Blisters of multiple sites 07/31/2013  . Generalized weakness 06/03/2013  . Insomnia 06/03/2013  . Neurogenic bladder 06/03/2013  . DS (disseminated sclerosis) ([place]) 02/12/2013  . Chronic diastolic heart failure ([place]) 09/29/2012  . Elevated CK 09/10/2012  . Severe malnutrition ([place]) 09/05/2012  . Paraplegia ([place]) 09/05/2012  . Fall 09/04/2012  . Chronic combined systolic and diastolic congestive heart failure ([place]) 05/22/2012  . Other primary cardiomyopathies 05/01/2012  . Immobility 04/28/2012  . OBESITY 11/30/2008  . Deficiency anemia 11/30/2008  . MULTIPLE SCLEROSIS, PROGRESSIVE/RELAPSING 11/30/2008  . Hypertensive heart disease with CHF (congestive heart failure) ([place]) 11/30/2008  . ECZEMA 11/30/2008    Orientation RESPIRATION BLADDER Height & Weight     Self (Confusion )  Normal Incontinent Weight: 223 lb 12.3 oz (101.5 kg) Height:  5\' 5"  (165.1 cm)  BEHAVIORAL SYMPTOMS/MOOD NEUROLOGICAL BOWEL NUTRITION STATUS  Other (Comment) (None)   Incontinent Diet (Dysphagia 3, thin liquids)  AMBULATORY STATUS COMMUNICATION OF NEEDS Skin   Extensive Assist Verbally Normal                       Personal Care Assistance Level of Assistance  Bathing, Feeding, Dressing Bathing Assistance: Limited assistance Feeding assistance: Limited assistance Dressing Assistance: Limited assistance     Functional Limitations Info  Speech, Sight, Hearing Sight Info: Adequate Hearing Info: Adequate Speech Info: Impaired    SPECIAL CARE FACTORS FREQUENCY  Contractures Contractures Info: Not present    Additional Factors Info  Code Status, Allergies, Psychotropic, Insulin Sliding Scale, Isolation Precautions Code Status Info: DNR Allergies Info: Sulfonamide Derivatives,  Penicillins Psychotropic Info: Cymbalta, Neurotin Insulin Sliding Scale Info: None Isolation Precautions Info: None     Current Medications (11/09/2015):  This is the current hospital active medication list Current Facility-Administered Medications  Medication Dose Route Frequency Provider Last Rate Last Dose  . acetaminophen (TYLENOL) tablet 650 mg  650 mg Oral Q6H PRN Norval Morton, MD   650 mg at 11/08/15 2140  . albuterol (PROVENTIL) (2.5 MG/3ML) 0.083% nebulizer solution 2.5 mg  2.5 mg Nebulization Q2H PRN Norval Morton, MD      . amantadine (SYMMETREL) capsule 100 mg  100 mg Oral Daily Norval Morton, MD   100 mg at 11/09/15 1015  . carvedilol (COREG) tablet 25 mg  25 mg Oral BID WC Norval Morton, MD   25 mg at 11/09/15 0850  . ceFEPIme (MAXIPIME) 2 g in dextrose 5 % 50 mL IVPB  2 g Intravenous Q12H Polly Cobia, RPH   2 g at 11/09/15 0550  . cholecalciferol (VITAMIN D) tablet 1,000 Units  1,000 Units Oral Daily Norval Morton, MD   1,000 Units at 11/09/15 1015  . DULoxetine (CYMBALTA) DR capsule 60 mg  60 mg Oral Daily Norval Morton, MD   60 mg at 11/09/15 1015  . enoxaparin (LOVENOX) injection 40 mg  40 mg Subcutaneous QHS Norval Morton, MD   40 mg at 11/08/15 2138  . famotidine (PEPCID) tablet 20 mg  20 mg Oral BID Norval Morton, MD   20 mg at 11/09/15 1015  . feeding supplement (ENSURE ENLIVE) (ENSURE ENLIVE) liquid 237 mL  237 mL Oral BID PRN Norval Morton, MD      . furosemide (LASIX) tablet 40 mg  40 mg Oral Daily Belkys A Regalado, MD   40 mg at 11/09/15 1055  . gabapentin (NEURONTIN) capsule 700 mg  700 mg Oral BID Norval Morton, MD   700 mg at 11/09/15 1015  . latanoprost (XALATAN) 0.005 % ophthalmic solution 1 drop  1 drop Both Eyes QHS Norval Morton, MD   1 drop at 11/08/15 2140  . ondansetron (ZOFRAN) tablet 4 mg  4 mg Oral Q6H PRN Norval Morton, MD       Or  . ondansetron (ZOFRAN) injection 4 mg  4 mg Intravenous Q6H PRN Rondell A Smith, MD      .  polyethylene glycol (MIRALAX / GLYCOLAX) packet 17 g  17 g Oral BID Norval Morton, MD   17 g at 11/09/15 1015  . polyvinyl alcohol (LIQUIFILM TEARS) 1.4 % ophthalmic solution 1 drop  1 drop Both Eyes BID Norval Morton, MD   1 drop at 11/09/15 1015  . senna-docusate (Senokot-S) tablet 1 tablet  1 tablet Oral BID Norval Morton, MD   1 tablet at 11/09/15 1015  . spironolactone (ALDACTONE) tablet 25 mg  25 mg Oral Daily Belkys A Regalado, MD   25 mg at 11/09/15 1056  . tamsulosin (FLOMAX) capsule 0.4 mg  0.4 mg Oral Daily Norval Morton, MD   0.4 mg at 11/09/15 1015  . vitamin B-12 (CYANOCOBALAMIN) tablet 1,000 mcg  1,000 mcg Oral Daily Norval Morton, MD   1,000 mcg at 11/09/15 1015     Discharge Medications: Please see discharge summary for a list of discharge medications.  Relevant Imaging Results:  Relevant Lab Results:   Additional Information    Lilly Cove, LCSW

## 2015-11-09 NOTE — Clinical Social Work Note (Signed)
Clinical Social Work Assessment  Patient Details  Name: Shannon Garrett MRN: [id number] Date of Birth: 1960-07-23  Date of referral:  11/09/15               Reason for consult:  Facility Placement                Permission sought to share information with:  Case Manager, Customer service manager Permission granted to share information::     Name::      Reizy Schurman  Agency::     Relationship::     Contact Information:   [phone]  Housing/Transportation Living arrangements for the past 2 months:  [place] of Information:  Facility Patient Interpreter Needed:  None Criminal Activity/Legal Involvement Pertinent to Current Situation/Hospitalization:  No - Comment as needed Significant Relationships:  Warehouse manager Lives with:   [id number]  Do you feel safe going back to the place where you live?  Yes Need for family participation in patient care:  Yes (Comment)  Care giving concerns: LCSW attempted to assess patient at bedside. Patient is unable to complete assessment at this time not oriented X4. LCSW called [facility] and spoke with Tammy. Tammy reported patient gets around in a wheel chair and needs limited assistance.Tammy reported patient has been at SNF as long term care patient and expect her to return if patient has PICC line for antibiotics.Tammy reported patient is usually alert and oriented. She can usually converse with others well and believes UTI has caused patient confusion.   Social Worker assessment / plan:  LCSW completed assessment due to patient being from facility. FL2 updated and MD to sign. DNR will be placed on chart for transport when medically stable. Contact attempted with family, unable to reach to understand current needs or concerns. Will follow up. Will continue to follow with assistance with disposition.  Employment status:  Retired Nurse, adult PT Recommendations:  No Follow Up (Return  to ALF) Information / Referral to community resources:     None  Patient/Family's Response to care:  Family member called, unable to reach, will follow up. Facility agreeable to accept patient back at [place].   Patient/Family's Understanding of and Emotional Response to Diagnosis, Current Treatment, and Prognosis: Unable to complete assessment with patient at this time due to confusion secondary to medical status.  Emotional Assessment Appearance:  Appears stated age Attitude/Demeanor/Rapport: Confused, only looked at LCSW during assessment, nonverbal Affect (typically observed):  Nonverbal, not following command, confused. Orientation:  Fluctuating Orientation (Suspected and/or reported Sundowners) Alcohol / Substance use:  Not Applicable Psych involvement (Current and /or in the community):  No (Comment)  Discharge Needs  Concerns to be addressed:  No discharge needs identified Readmission within the last 30 days:  Yes Current discharge risk:  None Barriers to Discharge:  No Barriers Identified   Lilly Cove, LCSW 11/09/2015, 10:43 AM

## 2015-11-09 NOTE — Progress Notes (Signed)
PT Cancellation Note  Patient Details Name: Shannon Garrett MRN: [id number] DOB: 03-Jul-1960   Cancelled Treatment:    Reason Eval/Treat Not Completed: Medical issues which prohibited therapy (patient is  very slow today, unable to converse. does not appear at baseline according to SNF notes. Patient requires total care at [facility] and is lifted to  [facility]. Uncertain if she is able to self mobilize  will check back itomorrow for improved MS and ability to participate. Claretha Cooper 11/09/2015, 3:15 PM Tresa Endo PT [phone]

## 2015-11-09 NOTE — Progress Notes (Signed)
Speech Language Pathology Treatment: Dysphagia  Patient Details Name: Mallak Rapier MRN: [id number] DOB: 1960/10/28 Today's Date: 11/09/2015 Time: [id number] SLP Time Calculation (min) (ACUTE ONLY): 25 min  Assessment / Plan / Recommendation Clinical Impression  Pt with limited participation during today's SLP session.  SLP set up tray for pt to eat and observed pt self feeding mashed potatoes, icecream and moistened crackers.  Subtle throat clearing noted post=swallow of thin via straw but no overt indication of airway compromise.  Pt with slow but effective mastication with cracker - minimal oral residual noted with pt benefiting from verbal cues to clear.  Note intake is very poor = suspect diet modification may be factor.  Per RN, pt's mental status has improved significantly today.  Recommend consider advancing diet to dys3/thin with strict precautions.  Set up assistance needed but suspect pt able to feed herself.    SLP to follow up to assure tolerance and educate pt.  Advised pt to recommendation and reviewed aspiration precautions.   Hopeful for swallow function to return to near baseline measure given pt with UTI and no new neuro changes.     HPI HPI: 55 y.o. female with medical history significant for MS, HTN, depression, cardiomyopathy, neurogenic bladder, and recurrent UTIs; who presents with altered mental status, UTI.      SLP Plan  Continue with current plan of care     Recommendations  Diet recommendations: Dysphagia 3 (mechanical soft);Thin liquid Liquids provided via: Cup;Straw Medication Administration: Whole meds with puree Supervision:  (set up assist recommended) Compensations: Minimize environmental distractions;Slow rate;Small sips/bites Postural Changes and/or Swallow Maneuvers: Seated upright 90 degrees;Upright 30-60 min after meal             Follow up Recommendations: None Plan: Continue with current plan of care     [place],  [place] [facility] SLP [phone]

## 2015-11-09 NOTE — Progress Notes (Signed)
PROGRESS NOTE  Shannon Garrett [id number] DOB: Apr 14, 1961 DOA: 11/07/2015 PCP: Hennie Duos, MD  HPI/Recap of past 24 hours: Patient is a 55 year old female with past mental history of chronic systolic/diastolic heart failure, MS and previous CVA who resides in a skilled nursing facility and was brought in after noting for 1 day to be confused, not very verbal and intermittently moaning. (Patient's baseline is reportedly very alert and able to converse normally) in the emergency room, patient noted to have mildly elevated creatinine at 1.23 and a large urinary tract infection. CT scan of head unremarkable. Patient started on IV fluids and antibiotics and admitted to the hospitalist service. Follow-up labs on early morning of 5/9 noted increase in sodium to 148.  Patient appears to be morel alert today. She was able to tell me that she is at [place], her birthday is soon. Patient seen by speech therapy who put her on dysphagia 1 diet.  Subjective;  Patient is more alert, was able to tell me she is at the hospital.  She uses wheel chair. at baseline she is not able to walk, has LE weakness.   Assessment/Plan:  Acute encephalopathy: Secondary to urinary tract infection, acute kidney injury and hypernatremia. Changed IV fluids to half-normal saline and treated with antibiotics  OBESITY: Patient meets criteria with BMI greater than 30      UTI (urinary tract infection): Awaiting cultures, prior to discharge, has prior enterococcus in urine.  Continue IV cefepime  MULTIPLE SCLEROSIS, PROGRESSIVE/RELAPSING: Continue home medications   Chronic combined systolic and diastolic congestive heart failure ([place]): She has wheezing, will stop IV fluids. And resume lasix.  Continue with coreg and spironolactone.     Neurogenic bladder   Depression   Essential hypertension   Abnormal LFTs   Chronic anemia    Dysphagia as late effect of stroke: Seen by speech therapy, on dysphagia 1  diet   Code Status: DO NOT RESUSCITATE  Family Communication: sister updated as patient requested.   Disposition Plan: Return back to skilled nursing once more back at baseline, awaiting urine culture.    Consultants:  None  Procedures:  None   Antimicrobials:  IV cefepime 5/8-present   DVT prophylaxis: Lovenox   Objective: Filed Vitals:   11/08/15 1356 11/08/15 2201 11/09/15 0515 11/09/15 0850  BP: 153/97 126/79 125/75   Pulse: 110 100 100 104  Temp: 98.7 F (37.1 C) 98.3 F (36.8 C) 98 F (36.7 C)   TempSrc: Oral Oral Oral   Resp: 18 20 18    Height: 5\' 5"  (1.651 m)     Weight: 101.5 kg (223 lb 12.3 oz)     SpO2: 100% 96% 98%     Intake/Output Summary (Last 24 hours) at 11/09/15 1039 Last data filed at 11/09/15 0600  Gross per 24 hour  Intake   2480 ml  Output      0 ml  Net   2480 ml   Filed Weights   11/08/15 0018 11/08/15 1356  Weight: 99.1 kg (218 lb 7.6 oz) 101.5 kg (223 lb 12.3 oz)    Exam:   General:  More alert. Oriented to person, place.   Cardiovascular: Regular rate and rhythm, S1-S2   Respiratory: mild wheezing.   Abdomen: Soft, nontender, obese, hypoactive bowel sounds   Musculoskeletal: No clubbing or cyanosis or edema   Skin: No skin breaks, tears or lesions    Data Reviewed: CBC:  Recent Labs Lab 11/07/15 1406 11/08/15 0042  WBC 7.5 9.9  NEUTROABS 3.8  --   HGB 11.6* 11.0*  HCT 35.6* 33.8*  MCV 82.2 81.8  PLT 346 [id number]   Basic Metabolic Panel:  Recent Labs Lab 11/07/15 1406 11/08/15 0042 11/09/15 0441  NA 142 148* 143  K 4.5 4.3 4.1  CL 104 110 108  CO2 27 28 24   GLUCOSE 116* 130* 112*  BUN 23* 23* 22*  CREATININE 1.23* 1.24* 1.07*  CALCIUM 9.5 9.6 9.1   GFR: Estimated Creatinine Clearance: 72.6 mL/min (by C-G formula based on Cr of 1.07). Liver Function Tests:  Recent Labs Lab 11/07/15 1406 11/08/15 0042  AST 22 21  ALT 23 20  ALKPHOS 153* 144*  BILITOT 0.5 0.4  PROT 8.4* 7.8  ALBUMIN 4.2  4.0   No results for input(s): LIPASE, AMYLASE in the last 168 hours.  Recent Labs Lab 11/07/15 2244  AMMONIA 22   Coagulation Profile: No results for input(s): INR, PROTIME in the last 168 hours. Cardiac Enzymes: No results for input(s): CKTOTAL, CKMB, CKMBINDEX, TROPONINI in the last 168 hours. BNP (last 3 results) No results for input(s): PROBNP in the last 8760 hours. HbA1C: No results for input(s): HGBA1C in the last 72 hours. CBG: No results for input(s): GLUCAP in the last 168 hours. Lipid Profile: No results for input(s): CHOL, HDL, LDLCALC, TRIG, CHOLHDL, LDLDIRECT in the last 72 hours. Thyroid Function Tests: No results for input(s): TSH, T4TOTAL, FREET4, T3FREE, THYROIDAB in the last 72 hours. Anemia Panel: No results for input(s): VITAMINB12, FOLATE, FERRITIN, TIBC, IRON, RETICCTPCT in the last 72 hours. Urine analysis:    Component Value Date/Time   COLORURINE YELLOW 11/07/2015 1605   APPEARANCEUR CLOUDY* 11/07/2015 1605   LABSPEC 1.016 11/07/2015 1605   PHURINE 7.5 11/07/2015 1605   GLUCOSEU NEGATIVE 11/07/2015 1605   HGBUR SMALL* 11/07/2015 [place] 11/07/2015 [place] 11/07/2015 1605   PROTEINUR 30* 11/07/2015 1605   UROBILINOGEN 1.0 01/10/2015 2311   NITRITE NEGATIVE 11/07/2015 1605   LEUKOCYTESUR LARGE* 11/07/2015 1605   Sepsis Labs: @LABRCNTIP (procalcitonin:4,lacticidven:4)  ) Recent Results (from the past 240 hour(s))  Blood culture (routine x 2)     Status: None (Preliminary result)   Collection Time: 11/07/15  1:57 PM  Result Value Ref Range Status   Specimen Description BLOOD LEFT ANTECUBITAL  Final   Special Requests BOTTLES DRAWN AEROBIC AND ANAEROBIC 5 ML  Final   Culture   Final    NO GROWTH < 24 HOURS Performed at [facility]    Report Status PENDING  Incomplete  Blood culture (routine x 2)     Status: None (Preliminary result)   Collection Time: 11/07/15  2:00 PM  Result Value Ref Range  Status   Specimen Description BLOOD RIGHT HAND  Final   Special Requests IN PEDIATRIC BOTTLE 1 ML  Final   Culture   Final    NO GROWTH < 24 HOURS Performed at [facility]    Report Status PENDING  Incomplete  Urine culture     Status: Abnormal (Preliminary result)   Collection Time: 11/07/15  4:05 PM  Result Value Ref Range Status   Specimen Description URINE, CATHETERIZED  Final   Special Requests NONE  Final   Culture >=100,000 COLONIES/mL GRAM NEGATIVE RODS (A)  Final   Report Status PENDING  Incomplete  MRSA PCR Screening     Status: None   Collection Time: 11/08/15  1:10 AM  Result Value Ref Range Status   MRSA by  PCR NEGATIVE NEGATIVE Final    Comment:        The GeneXpert MRSA Assay (FDA approved for NASAL specimens only), is one component of a comprehensive MRSA colonization surveillance program. It is not intended to diagnose MRSA infection nor to guide or monitor treatment for MRSA infections.       Studies: No results found.  Scheduled Meds: . amantadine  100 mg Oral Daily  . carvedilol  25 mg Oral BID WC  . ceFEPime (MAXIPIME) IV  2 g Intravenous Q12H  . cholecalciferol  1,000 Units Oral Daily  . DULoxetine  60 mg Oral Daily  . enoxaparin (LOVENOX) injection  40 mg Subcutaneous QHS  . famotidine  20 mg Oral BID  . furosemide  40 mg Oral Daily  . gabapentin  700 mg Oral BID  . latanoprost  1 drop Both Eyes QHS  . polyethylene glycol  17 g Oral BID  . polyvinyl alcohol  1 drop Both Eyes BID  . senna-docusate  1 tablet Oral BID  . tamsulosin  0.4 mg Oral Daily  . vitamin B-12  1,000 mcg Oral Daily    Continuous Infusions: . sodium chloride 100 mL/hr at 11/09/15 0550     LOS: 1 day   Time spent: 25 minutes  Elmarie Shiley, MD [facility] Pager [phone]  If 7PM-7AM, please contact night-coverage [email or web address] Password [email or web address] 11/09/2015, 10:39 AM

## 2015-11-09 NOTE — Progress Notes (Signed)
Bladder scanned for 83 cc

## 2015-11-10 ENCOUNTER — Non-Acute Institutional Stay (SKILLED_NURSING_FACILITY): Payer: Medicare Other | Admitting: Internal Medicine

## 2015-11-10 ENCOUNTER — Encounter: Payer: Self-pay | Admitting: Internal Medicine

## 2015-11-10 ENCOUNTER — Encounter (HOSPITAL_COMMUNITY): Payer: Self-pay

## 2015-11-10 ENCOUNTER — Emergency Department (HOSPITAL_COMMUNITY)
Admission: EM | Admit: 2015-11-10 | Discharge: 2015-11-10 | Disposition: A | Discharge status: Home / Self Care | Payer: Medicare Other | Attending: Emergency Medicine | Admitting: Emergency Medicine

## 2015-11-10 DIAGNOSIS — F329 Major depressive disorder, single episode, unspecified: Secondary | ICD-10-CM

## 2015-11-10 DIAGNOSIS — I69391 Dysphagia following cerebral infarction: Secondary | ICD-10-CM

## 2015-11-10 DIAGNOSIS — F32A Depression, unspecified: Secondary | ICD-10-CM

## 2015-11-10 DIAGNOSIS — N319 Neuromuscular dysfunction of bladder, unspecified: Secondary | ICD-10-CM

## 2015-11-10 DIAGNOSIS — N3000 Acute cystitis without hematuria: Secondary | ICD-10-CM | POA: Diagnosis not present

## 2015-11-10 DIAGNOSIS — I5042 Chronic combined systolic (congestive) and diastolic (congestive) heart failure: Secondary | ICD-10-CM

## 2015-11-10 DIAGNOSIS — G9340 Encephalopathy, unspecified: Secondary | ICD-10-CM | POA: Diagnosis not present

## 2015-11-10 DIAGNOSIS — K219 Gastro-esophageal reflux disease without esophagitis: Secondary | ICD-10-CM

## 2015-11-10 DIAGNOSIS — G35 Multiple sclerosis: Secondary | ICD-10-CM | POA: Insufficient documentation

## 2015-11-10 DIAGNOSIS — F919 Conduct disorder, unspecified: Secondary | ICD-10-CM | POA: Insufficient documentation

## 2015-11-10 DIAGNOSIS — B964 Proteus (mirabilis) (morganii) as the cause of diseases classified elsewhere: Secondary | ICD-10-CM

## 2015-11-10 DIAGNOSIS — N390 Urinary tract infection, site not specified: Secondary | ICD-10-CM | POA: Diagnosis not present

## 2015-11-10 DIAGNOSIS — Z79899 Other long term (current) drug therapy: Secondary | ICD-10-CM | POA: Insufficient documentation

## 2015-11-10 DIAGNOSIS — I110 Hypertensive heart disease with heart failure: Secondary | ICD-10-CM | POA: Diagnosis not present

## 2015-11-10 DIAGNOSIS — E559 Vitamin D deficiency, unspecified: Secondary | ICD-10-CM

## 2015-11-10 DIAGNOSIS — I5022 Chronic systolic (congestive) heart failure: Secondary | ICD-10-CM | POA: Insufficient documentation

## 2015-11-10 DIAGNOSIS — Z7951 Long term (current) use of inhaled steroids: Secondary | ICD-10-CM | POA: Insufficient documentation

## 2015-11-10 DIAGNOSIS — G629 Polyneuropathy, unspecified: Secondary | ICD-10-CM

## 2015-11-10 DIAGNOSIS — Z792 Long term (current) use of antibiotics: Secondary | ICD-10-CM | POA: Insufficient documentation

## 2015-11-10 DIAGNOSIS — Z87891 Personal history of nicotine dependence: Secondary | ICD-10-CM | POA: Insufficient documentation

## 2015-11-10 LAB — URINE CULTURE: Culture: >=100000 — AB

## 2015-11-10 MED ORDER — CIPROFLOXACIN HCL 500 MG PO TABS
500.0000 mg | ORAL_TABLET | Freq: Once | ORAL | Status: AC
  Administered 2015-11-10: 500 mg via ORAL
  Filled 2015-11-10: qty 1

## 2015-11-10 MED ORDER — FUROSEMIDE 40 MG PO TABS: 40.0000 mg | ORAL_TABLET | Freq: Every day | ORAL | Status: DC

## 2015-11-10 MED ORDER — CEPHALEXIN 500 MG PO CAPS: 500.0000 mg | ORAL_CAPSULE | Freq: Two times a day (BID) | ORAL | Status: DC

## 2015-11-10 MED ORDER — BACLOFEN 20 MG PO TABS: 20.0000 mg | ORAL_TABLET | Freq: Three times a day (TID) | ORAL | Status: DC | PRN

## 2015-11-10 MED ORDER — CEPHALEXIN 500 MG PO CAPS
500.0000 mg | ORAL_CAPSULE | Freq: Two times a day (BID) | ORAL | Status: DC
  Filled 2015-11-10: qty 1

## 2015-11-10 MED ORDER — CIPROFLOXACIN HCL 500 MG PO TABS: 500.0000 mg | ORAL_TABLET | Freq: Two times a day (BID) | ORAL | Status: DC

## 2015-11-10 MED ORDER — ALBUTEROL SULFATE (2.5 MG/3ML) 0.083% IN NEBU: 2.5000 mg | INHALATION_SOLUTION | RESPIRATORY_TRACT | Status: DC | PRN

## 2015-11-10 MED ORDER — GABAPENTIN 100 MG PO CAPS: 700.0000 mg | ORAL_CAPSULE | Freq: Two times a day (BID) | ORAL | Status: DC

## 2015-11-10 NOTE — ED Notes (Signed)
Bed: [id number] Expected date:  Expected time:  Means of arrival:  Comments: EMS-UTI--already seen today for the same

## 2015-11-10 NOTE — Progress Notes (Signed)
MRN: [id number] Name: Shannon Garrett  Sex: female Age: 55 y.o. DOB: 30-Nov-1960  [place] #: Karren Burly Facility/Room:230 Level Of Care: SNF Provider: Inocencio Homes D Emergency Contacts: Extended Emergency Contact Information Primary Emergency Contact: [place], [place] [address] [place] Phone: [phone] Relation: Sister Secondary Emergency Contact: Jefm Bryant, [place] Phone: [phone] Mobile Phone: [phone] Relation: Friend  Code Status:   Allergies: Sulfonamide derivatives and Penicillins  Chief Complaint  Patient presents with  . Readmit To SNF    HPI: Patient is 55 y.o. female with  history of chronic systolic/diastolic heart failure, MS and previous CVA who resides in a skilled nursing facility and was brought in after noting for 1 day to be confused, not very verbal and intermittently moaning. (Patient's baseline is reportedly very alert and able to converse normally) in the emergency room, patient noted to have mildly elevated creatinine at 1.23 and a large urinary tract infection. CT scan of head unremarkable. Patient started on IV fluids and antibiotics and admitted to [facility] from 5/8-11 for urosepsis and hypernatremia, resolved with IVF. Pt is admitted back to SNF for residential care. While at SNF pt will be followed for HTN, tc with coreg and lasix, CHF, tx with coreg and lasix and neurogenic bladder, tx with myrbetric and flomax.  Past Medical History  Diagnosis Date  . MS (multiple sclerosis) ([place])     a. Dx'd late 20's. b. Tx with Novantrone, Tysabri, Copaxone previously.  Marland Kitchen Hypertension   . Depression   . Glaucoma   . Cardiomyopathy ([place])     a.  Echo 04/29/12: Mild LVH, EF 20-25%, mild AI, moderate MR, moderate LAE, mild RAE, mild RVE, moderate TR, PASP 51, small pericardial effusion;   b. probably non-ischemic given multiple chemo-Tx agents used for MS and global LV dysfn on  echo  . Chronic systolic heart failure [facility])     Past Surgical History  Procedure Laterality Date  . Ablation      uterine  . Cesarean section        Medication List       This list is accurate as of: 11/10/15 11:59 PM.  Always use your most recent med list.               acetaminophen 325 MG tablet  Commonly known as:  TYLENOL  Take 2 tablets (650 mg total) by mouth every 6 (six) hours as needed for mild pain or fever (or Fever >/= 101).     albuterol (2.5 MG/3ML) 0.083% nebulizer solution  Commonly known as:  PROVENTIL  Take 3 mLs (2.5 mg total) by nebulization every 2 (two) hours as needed for wheezing.     amantadine 100 MG capsule  Commonly known as:  SYMMETREL  Take 100 mg by mouth daily.     baclofen 20 MG tablet  Commonly known as:  LIORESAL  Take 1 tablet (20 mg total) by mouth 3 (three) times daily as needed for muscle spasms.     carvedilol 25 MG tablet  Commonly known as:  COREG  Take 1 tablet (25 mg total) by mouth 2 (two) times daily with a meal.     ciprofloxacin 500 MG tablet  Commonly known as:  CIPRO  Take 1 tablet (500 mg total) by mouth 2 (two) times daily.     CRANBERRY PO  Take 100  mg by mouth daily.     DULoxetine 60 MG capsule  Commonly known as:  CYMBALTA  Take 60 mg by mouth daily.     famotidine 20 MG tablet  Commonly known as:  PEPCID  Take 1 tablet (20 mg total) by mouth 2 (two) times daily.     feeding supplement (ENSURE ENLIVE) Liqd  Take 237 mLs by mouth 2 (two) times daily as needed (Please provide to pt if she she eats less than 50% of breakfast and dinner).     furosemide 40 MG tablet  Commonly known as:  LASIX  Take 1 tablet (40 mg total) by mouth daily.     gabapentin 100 MG capsule  Commonly known as:  NEURONTIN  Take 7 capsules (700 mg total) by mouth 2 (two) times daily.     magnesium hydroxide 400 MG/5ML suspension  Commonly known as:  MILK OF MAGNESIA  Take 30 mLs by mouth every 8 (eight) hours as needed for  mild constipation or moderate constipation.     mirabegron ER 25 MG Tb24 tablet  Commonly known as:  MYRBETRIQ  Take 25 mg by mouth at bedtime.     ondansetron 4 MG tablet  Commonly known as:  ZOFRAN  Take 1 tablet (4 mg total) by mouth every 6 (six) hours as needed for nausea.     polyethylene glycol packet  Commonly known as:  MIRALAX / GLYCOLAX  Take 17 g by mouth daily as needed for mild constipation.     potassium chloride 20 MEQ packet  Commonly known as:  KLOR-CON  Take 20 mEq by mouth daily.     SENOKOT S 8.6-50 MG tablet  Generic drug:  senna-docusate  Take 1 tablet by mouth 2 (two) times daily.     spironolactone 25 MG tablet  Commonly known as:  ALDACTONE  Take 25 mg by mouth daily. For CHF     SYSTANE BALANCE 0.6 % Soln  Generic drug:  Propylene Glycol  Place 1 drop into both eyes 2 (two) times daily.     tamsulosin 0.4 MG Caps capsule  Commonly known as:  FLOMAX  Take 0.4 mg by mouth daily. For incontinence     VITAMIN B-12 PO  Take 1 tablet by mouth daily.     VITAMIN D-3 PO  Take 1,000 Int'l Units/day by mouth daily.     XALATAN 0.005 % ophthalmic solution  Generic drug:  latanoprost  Place 1 drop into both eyes at bedtime.        No orders of the defined types were placed in this encounter.    Immunization History  Administered Date(s) Administered  . Influenza,inj,Quad PF,36+ Mos 08/01/2013  . Influenza-Unspecified 08/31/2014, 08/09/2015  . Pneumococcal Polysaccharide-23 08/01/2013  . Pneumococcal-Unspecified 08/30/2013, 07/29/2014    Social History  Substance Use Topics  . Smoking status: Former Research scientist (life sciences)  . Smokeless tobacco: Never Used  . Alcohol Use: No    Family history is + MS  Review of Systems  DATA OBTAINED: from patient GENERAL:  no fevers, fatigue, appetite changes SKIN: No itching, rash or wounds EYES: No eye pain, redness, discharge EARS: No earache, tinnitus, change in hearing NOSE: No congestion, drainage or  bleeding  MOUTH/THROAT: No mouth or tooth pain, No sore throat RESPIRATORY: No cough, wheezing, SOB CARDIAC: No chest pain, palpitations, lower extremity edema  GI: No abdominal pain, No N/V/D or constipation, No heartburn or reflux  GU: No dysuria, frequency or urgency, or incontinence  MUSCULOSKELETAL: No  unrelieved bone/joint pain NEUROLOGIC: No headache, dizziness or focal weakness PSYCHIATRIC: pt admits feels down on herself  Filed Vitals:   11/10/15 2212  BP: 157/97  Pulse: 105  Temp: 97.9 F (36.6 C)  Resp: 19    SpO2 Readings from Last 1 Encounters:  11/10/15 97%        Physical Exam  GENERAL APPEARANCE: Alert, able to converse but not as well appearing as last time she came back from hospital  SKIN: No diaphoresis rash HEAD: Normocephalic, atraumatic  EYES: Conjunctiva/lids clear. Pupils round, reactive. EOMs intact.  EARS: External exam WNL, canals clear. Hearing grossly normal.  NOSE: No deformity or discharge.  MOUTH/THROAT: Lips w/o lesions  RESPIRATORY: Breathing is even, unlabored. Lung sounds are clear   CARDIOVASCULAR: Heart RRR no murmurs, rubs or gallops. No peripheral edema.   GASTROINTESTINAL: Abdomen is soft, non-tender, not distended w/ normal bowel sounds. GENITOURINARY: Bladder non tender, not distended  MUSCULOSKELETAL: wasting and contractures NEUROLOGIC:  Cranial nerves 2-12 grossly intact; paraplegia PSYCHIATRIC: depressed, no behavioral issues  Patient Active Problem List   Diagnosis Date Noted  . Urinary tract infection due to Proteus 11/13/2015  . Neuropathy ([place]) 11/13/2015  . GERD (gastroesophageal reflux disease) 11/13/2015  . UTI (urinary tract infection) 11/08/2015  . Dysphagia as late effect of stroke 11/08/2015  . Adrenal insufficiency (Addison's disease) ([place]) 08/12/2015  . Altered mental status   . Acute respiratory failure ([place])   . Gram-negative infection 07/06/2015  . Sepsis ([place]) 07/04/2015  . Vitamin D deficiency  06/18/2015  . Multiple sclerosis exacerbation ([place]) 06/08/2015  . Weakness 06/08/2015  . Chronic systolic (congestive) heart failure ([place]) 06/08/2015  . Acute encephalopathy 06/03/2015  . Acute kidney injury ([place]) 06/03/2015  . Hypokalemia 06/03/2015  . Sepsis secondary to UTI ([place]) 05/30/2015  . Hypernatremia 05/30/2015  . Dehydration 05/30/2015  . Chronic anemia 05/30/2015  . Chronic pain syndrome 03/03/2015  . Benign hypertensive heart disease without heart failure 01/18/2015  . Urinary tract infection due to Enterococcus   . Essential hypertension   . Abnormal LFTs   . Foot drop, bilateral   . Pressure ulcer 01/12/2015  . UTI (lower urinary tract infection) 01/11/2015  . Hypovolemia 01/11/2015  . UTI (urinary tract infection), uncomplicated   . Cardiomyopathy ([place])   . Urinary incontinence 01/07/2015  . Chronic constipation 11/26/2014  . Depression 11/26/2014  . Tachycardia 02/20/2014  . Venous stasis ulcers ([place]) 02/20/2014  . Leg wound, left 11/04/2013  . FTT (failure to thrive) in adult 07/31/2013  . Medically noncompliant 07/31/2013  . Blisters of multiple sites 07/31/2013  . Generalized weakness 06/03/2013  . Insomnia 06/03/2013  . Neurogenic bladder 06/03/2013  . DS (disseminated sclerosis) ([place]) 02/12/2013  . Chronic diastolic heart failure ([place]) 09/29/2012  . Elevated CK 09/10/2012  . Severe malnutrition ([place]) 09/05/2012  . Paraplegia ([place]) 09/05/2012  . Fall 09/04/2012  . Chronic combined systolic and diastolic congestive heart failure ([place]) 05/22/2012  . Other primary cardiomyopathies 05/01/2012  . Immobility 04/28/2012  . OBESITY 11/30/2008  . Deficiency anemia 11/30/2008  . MULTIPLE SCLEROSIS, PROGRESSIVE/RELAPSING 11/30/2008  . Hypertensive heart disease with CHF (congestive heart failure) ([place]) 11/30/2008  . ECZEMA 11/30/2008    CBC    Component Value Date/Time   WBC 9.9 11/08/2015 0042   WBC 6.2 05/18/2015 1626   RBC 4.13 11/08/2015 0042    RBC 3.55* 05/18/2015 1626   RBC 3.70* 01/12/2015 2111   HGB 11.0* 11/08/2015 0042   HCT 33.8* 11/08/2015 0042  HCT 29.6* 05/18/2015 1626   PLT 322 11/08/2015 0042   PLT 222 05/18/2015 1626   MCV 81.8 11/08/2015 0042   MCV 83 05/18/2015 1626   LYMPHSABS 2.3 11/07/2015 1406   LYMPHSABS 2.1 05/18/2015 1626   MONOABS 0.6 11/07/2015 1406   EOSABS 0.8* 11/07/2015 1406   EOSABS 0.5* 05/18/2015 1626   BASOSABS 0.1 11/07/2015 1406   BASOSABS 0.1 05/18/2015 1626    CMP     Component Value Date/Time   NA 143 11/09/2015 0441   NA 142 09/28/2014   K 4.1 11/09/2015 0441   CL 108 11/09/2015 0441   CO2 24 11/09/2015 0441   GLUCOSE 112* 11/09/2015 0441   BUN 22* 11/09/2015 0441   BUN 16 09/28/2014   CREATININE 1.07* 11/09/2015 0441   CALCIUM 9.1 11/09/2015 0441   PROT 7.8 11/08/2015 0042   ALBUMIN 4.0 11/08/2015 0042   AST 21 11/08/2015 0042   ALT 20 11/08/2015 0042   ALKPHOS 144* 11/08/2015 0042   BILITOT 0.4 11/08/2015 0042   GFRNONAA 59* 11/09/2015 0441   GFRAA >60 11/09/2015 0441    Lab Results  Component Value Date   HGBA1C 5.3 01/11/2015     No results found.  Not all labs, radiology exams or other studies done during hospitalization come through on my EPIC note; however they are reviewed by me.    Assessment and Plan  Acute encephalopathy Secondary to urinary tract infection, acute kidney injury and hypernatremia. Resolved, back to baseline. Baclofen was on hold during admission. SNF - restatrt 10 mg TID  Urinary tract infection due to Proteus received 3 days of IV cefepime. Urine grew Proteous SNF - d/c with 4 more days of keflex.   Hypertensive heart disease with CHF (congestive heart failure) (HCC) SNF - controlled on coreg 25 mg BID, lasix 40 mg daily and spironolactone 25 mg daily  Chronic combined systolic and diastolic congestive heart failure (HCC) SNF - no exacerbations;cont lasic and goreg 25 mg BID  Dysphagia as late effect of stroke SNF -  institute dysphagia 1 diet  MULTIPLE SCLEROSIS, PROGRESSIVE/RELAPSING SNF - cont symetrel 100 mg daily  Neurogenic bladder SNF - chronic and stable; cont vesicsare and fomax  Vitamin D deficiency SNF - cont replaqcement 1000u daily  Neuropathy (HCC) SNF - chronic ans stable ; cont neurontin 700 mg BID  GERD (gastroesophageal reflux disease) SNF - cont pepcid 20 mg BID  Depression SNF - not controlled at this time ;pt has something very specific on her ming; will consult Psych but pt would benefit from counseling   Time spent > 45 min;> 50% of time with patient was spent reviewing records, labs, tests and studies, counseling and developing plan of care  Hennie Duos, MD

## 2015-11-10 NOTE — ED Provider Notes (Signed)
CSN: [id number]     Arrival date & time 11/10/15  1801 History   First MD Initiated Contact with Patient 11/10/15 1842     Chief Complaint  Patient presents with  . Urinary Tract Infection     (Consider location/radiation/quality/duration/timing/severity/associated sxs/prior Treatment) HPI   Shannon Garrett is a 55 y.o. female, with a history of MS and hypertension, presenting to the ED from [facility] facility due to abnormal behavior. Staff states patient was "acting differently." Pt denies any complaints other than "feeling stressed out." Pt was seen on May 8 for AMS, diagnosed with UTI, and admitted. Pt was discharged this morning with Rx for PO ABX. Facility staff apparently did not give her these antibiotics, but state patient is still confused. She continues to deny complaints.   Past Medical History  Diagnosis Date  . MS (multiple sclerosis) ([place])     a. Dx'd late 20's. b. Tx with Novantrone, Tysabri, Copaxone previously.  Marland Kitchen Hypertension   . Depression   . Glaucoma   . Cardiomyopathy ([place])     a.  Echo 04/29/12: Mild LVH, EF 20-25%, mild AI, moderate MR, moderate LAE, mild RAE, mild RVE, moderate TR, PASP 51, small pericardial effusion;   b. probably non-ischemic given multiple chemo-Tx agents used for MS and global LV dysfn on echo  . Chronic systolic heart failure [facility])    Past Surgical History  Procedure Laterality Date  . Ablation      uterine  . Cesarean section     Family History  Problem Relation Age of Onset  . Hypertension Mother   . Heart attack Neg Hx   . Cancer Mother     breast   . Cancer Father     prostate  . Multiple sclerosis Sister    Social History  Substance Use Topics  . Smoking status: Former Research scientist (life sciences)  . Smokeless tobacco: Never Used  . Alcohol Use: No   OB History    No data available     Review of Systems  Constitutional: Negative for fever and chills.  Gastrointestinal: Negative for nausea, vomiting and abdominal pain.  Genitourinary:  Negative for dysuria and hematuria.  Neurological: Negative for dizziness and light-headedness.  Psychiatric/Behavioral: Positive for behavioral problems (reported).  All other systems reviewed and are negative.      Allergies  Sulfonamide derivatives and Penicillins  Home Medications   Prior to Admission medications   Medication Sig Start Date End Date Taking? Authorizing Provider  acetaminophen (TYLENOL) 325 MG tablet Take 2 tablets (650 mg total) by mouth every 6 (six) hours as needed for mild pain or fever (or Fever >/= 101). 07/07/15   Robbie Lis, MD  albuterol (PROVENTIL) (2.5 MG/3ML) 0.083% nebulizer solution Take 3 mLs (2.5 mg total) by nebulization every 2 (two) hours as needed for wheezing. 11/10/15   Belkys A Regalado, MD  amantadine (SYMMETREL) 100 MG capsule Take 100 mg by mouth daily.     Historical Provider, MD  baclofen (LIORESAL) 20 MG tablet Take 1 tablet (20 mg total) by mouth 3 (three) times daily as needed for muscle spasms. 11/10/15   Belkys A Regalado, MD  carvedilol (COREG) 25 MG tablet Take 1 tablet (25 mg total) by mouth 2 (two) times daily with a meal. 07/25/15   Ripudeep K Rai, MD  Cholecalciferol (VITAMIN D-3 PO) Take 1,000 Int'l Units/day by mouth daily.     Historical Provider, MD  ciprofloxacin (CIPRO) 500 MG tablet Take 1 tablet (500 mg total) by mouth  2 (two) times daily. 11/10/15   Alistar Mcenery C Latravious Levitt, PA-C  CRANBERRY PO Take 100 mg by mouth daily.    Historical Provider, MD  Cyanocobalamin (VITAMIN B-12 PO) Take 1 tablet by mouth daily.    Historical Provider, MD  DULoxetine (CYMBALTA) 60 MG capsule Take 60 mg by mouth daily.    Historical Provider, MD  famotidine (PEPCID) 20 MG tablet Take 1 tablet (20 mg total) by mouth 2 (two) times daily. 07/25/15   Ripudeep Krystal Eaton, MD  feeding supplement, ENSURE ENLIVE, (ENSURE ENLIVE) LIQD Take 237 mLs by mouth 2 (two) times daily as needed (Please provide to pt if she she eats less than 50% of breakfast and dinner). 07/07/15    Robbie Lis, MD  furosemide (LASIX) 40 MG tablet Take 1 tablet (40 mg total) by mouth daily. 11/10/15   Belkys A Regalado, MD  gabapentin (NEURONTIN) 100 MG capsule Take 7 capsules (700 mg total) by mouth 2 (two) times daily. 11/10/15   Belkys A Regalado, MD  latanoprost (XALATAN) 0.005 % ophthalmic solution Place 1 drop into both eyes at bedtime.    Historical Provider, MD  magnesium hydroxide (MILK OF MAGNESIA) 400 MG/5ML suspension Take 30 mLs by mouth every 8 (eight) hours as needed for mild constipation or moderate constipation.    Historical Provider, MD  mirabegron ER (MYRBETRIQ) 25 MG TB24 tablet Take 25 mg by mouth at bedtime.    Historical Provider, MD  ondansetron (ZOFRAN) 4 MG tablet Take 1 tablet (4 mg total) by mouth every 6 (six) hours as needed for nausea. 07/07/15   Robbie Lis, MD  polyethylene glycol [facility] Floria Raveling) packet Take 17 g by mouth daily as needed for mild constipation. Patient taking differently: Take 17 g by mouth 2 (two) times daily.  07/25/15   Ripudeep Krystal Eaton, MD  potassium chloride (KLOR-CON) 20 MEQ packet Take 20 mEq by mouth daily.     Historical Provider, MD  Propylene Glycol (SYSTANE BALANCE) 0.6 % SOLN Place 1 drop into both eyes 2 (two) times daily.    Historical Provider, MD  senna-docusate (SENOKOT S) 8.6-50 MG tablet Take 1 tablet by mouth 2 (two) times daily.    Historical Provider, MD  spironolactone (ALDACTONE) 25 MG tablet Take 25 mg by mouth daily. For CHF    Historical Provider, MD  tamsulosin (FLOMAX) 0.4 MG CAPS capsule Take 0.4 mg by mouth daily. For incontinence    Historical Provider, MD   BP 133/96 mmHg  Pulse 107  Temp(Src) 98.8 F (37.1 C) (Oral)  Resp 18  SpO2 97% Physical Exam  Constitutional: She is oriented to person, place, and time. She appears well-developed and well-nourished. No distress.  HENT:  Head: Normocephalic and atraumatic.  Eyes: Conjunctivae and EOM are normal. Pupils are equal, round, and reactive to light.   Neck: Neck supple.  Cardiovascular: Regular rhythm, normal heart sounds and intact distal pulses.  Tachycardia present.   Pulmonary/Chest: Effort normal and breath sounds normal. No respiratory distress.  Abdominal: Soft. There is no tenderness. There is no guarding.  Musculoskeletal: She exhibits no edema or tenderness.  Lymphadenopathy:    She has no cervical adenopathy.  Neurological: She is alert and oriented to person, place, and time. She has normal reflexes.  Pt is alert to self, place, time, and situation. Pt will hold a conversation and follow commands.  Skin: Skin is warm and dry. She is not diaphoretic.  Psychiatric: She has a normal mood and affect. Her  behavior is normal.  Nursing note and vitals reviewed.   ED Course  Procedures (including critical care time)   MDM   Final diagnoses:  Acute cystitis without hematuria    Shannon Garrett presents with a report of abnormal behavior following a diagnosis of UTI.  Findings and plan of care discussed with Carmin Muskrat, MD.  Patient sent here for abnormal behavior. No on behavior noted. Patient is alert and oriented, carries on a conversation, and follows instructions. Suspect patient may still have UTI. Patient noted to be minorly tachycardic, however, patient was tachycardic upon discharge this morning. Patient is nontoxic appearing, afebrile, not tachypneic, maintains SPO2 of 97% on room air, and is in no apparent distress. Patient has no signs of sepsis or other serious or life-threatening condition. Urine culture shows her UTI is sensitive to Cipro. Patient placed on Cipro and staff was encouraged to fill this prescription. Return precautions discussed. Patient voices understanding of these instructions and is comfortable with discharge.  Filed Vitals:   11/10/15 1816  BP: 133/96  Pulse: 107  Temp: 98.8 F (37.1 C)  TempSrc: Oral  Resp: 18  SpO2: 97%     Lorayne Bender, PA-C 11/10/15 2039  Carmin Muskrat,  MD 11/10/15 2243

## 2015-11-10 NOTE — Discharge Summary (Signed)
Physician Discharge Summary  Shannon Garrett HXT:[id number] DOB: Oct 30, 1960 DOA: 11/07/2015  PCP: Hennie Duos, MD  Admit date: 11/07/2015 Discharge date: 11/10/2015  Time spent: 35 minutes  Recommendations for Outpatient Follow-up:  1. Needs B-met in 24 hours to follow electrolytes. Increase lasix as needed.    Discharge Diagnoses:    Acute encephalopathy  UTI (urinary tract infection), proteus Mirabilis    OBESITY   MULTIPLE SCLEROSIS, PROGRESSIVE/RELAPSING   Chronic combined systolic and diastolic congestive heart failure (HCC)   Neurogenic bladder   Depression   Essential hypertension   Abnormal LFTs   Chronic anemia     Dysphagia as late effect of stroke   Discharge Condition: stable  Diet recommendation: Dysphagia 3 diet thin fluids.   Filed Weights   11/08/15 0018 11/08/15 1356  Weight: 99.1 kg (218 lb 7.6 oz) 101.5 kg (223 lb 12.3 oz)    History of present illness:  Shannon Garrett is a 55 y.o. female with medical history significant of MS, HTN, depression, cardiomyopathy, neurogenic blad is asder, and recurrent UTIs; who presents with altered mental status. History is obtained per review of records that the patient has been nonverbal at this time. Patient was noted to be less conversant at [facility] senior care living facility only noted to be staring off and intermittently moaning. She was not following commands. At baseline, patient is usually very alert and able to converse normally. Appears in review of previous records patient's had multiple urinary tract infections in the past.   Hospital Course:  HPI/Recap of past 24 hours: Patient is a 55 year old female with past mental history of chronic systolic/diastolic heart failure, MS and previous CVA who resides in a skilled nursing facility and was brought in after noting for 1 day to be confused, not very verbal and intermittently moaning. (Patient's baseline is reportedly very alert and able to converse normally) in  the emergency room, patient noted to have mildly elevated creatinine at 1.23 and a large urinary tract infection. CT scan of head unremarkable. Patient started on IV fluids and antibiotics and admitted to the hospitalist service. Follow-up labs on early morning of 5/9 noted increase in sodium to 148.  Patient appears to be morel alert today. She was able to tell me that she is at [place], her birthday is soon. Patient seen by speech therapy who put her on dysphagia 1 diet.  patient is alert, answering questions. Oriented to place, and situation.  She is breathing better. Feeling back to baseline.   Assessment/Plan:  Acute encephalopathy: Secondary to urinary tract infection, acute kidney injury and hypernatremia. Resolved, back to baseline. Baclofen was on hold during admission. Will resume TID PRN , will need to be hold for sedation.   OBESITY: Patient meets criteria with BMI greater than 30   UTI (urinary tract infection): Awaiting cultures, prior to discharge, has prior enterococcus in urine.  received 3 days of IV cefepime. She will be discharge on keflex for 4 more days.  Urine grew Proteous.   MULTIPLE SCLEROSIS, PROGRESSIVE/RELAPSING: Continue home medications  stable.   Chronic combined systolic and diastolic congestive heart failure ([place]): She had  Wheezing 5-10, will stop IV fluids. And resume lasix.  Continue with coreg and spironolactone.  Lasix was decrease to half dose from home due to hypernatremia. Follow B-met and adjust lasix as needed.    Neurogenic bladder  Depression  Essential hypertension  Abnormal LFTs  Chronic anemia   Dysphagia as late effect of stroke: Seen by speech  therapy, on dysphagia 1 diet  Procedures:  none  Consultations:  none  Discharge Exam: Filed Vitals:   11/10/15 0552 11/10/15 0723  BP: 157/97   Pulse: 105 101  Temp: 97.9 F (36.6 C)   Resp: 19     General: Alert, following command Cardiovascular: S 1, S 2  RRR Respiratory: CTA  Discharge Instructions   Discharge Instructions    Diet - low sodium heart healthy    Complete by:  As directed      Increase activity slowly    Complete by:  As directed           Current Discharge Medication List    START taking these medications   Details  albuterol (PROVENTIL) (2.5 MG/3ML) 0.083% nebulizer solution Take 3 mLs (2.5 mg total) by nebulization every 2 (two) hours as needed for wheezing. Qty: 75 mL, Refills: 12    cephALEXin (KEFLEX) 500 MG capsule Take 1 capsule (500 mg total) by mouth every 12 (twelve) hours. Qty: 8 capsule, Refills: 0      CONTINUE these medications which have CHANGED   Details  baclofen (LIORESAL) 20 MG tablet Take 1 tablet (20 mg total) by mouth 3 (three) times daily as needed for muscle spasms. Qty: 30 each, Refills: 0    furosemide (LASIX) 40 MG tablet Take 1 tablet (40 mg total) by mouth daily. Qty: 30 tablet, Refills: 0    gabapentin (NEURONTIN) 100 MG capsule Take 7 capsules (700 mg total) by mouth 2 (two) times daily. Qty: 30 capsule, Refills: 0      CONTINUE these medications which have NOT CHANGED   Details  acetaminophen (TYLENOL) 325 MG tablet Take 2 tablets (650 mg total) by mouth every 6 (six) hours as needed for mild pain or fever (or Fever >/= 101). Qty: 30 tablet, Refills: 0    amantadine (SYMMETREL) 100 MG capsule Take 100 mg by mouth daily.     carvedilol (COREG) 25 MG tablet Take 1 tablet (25 mg total) by mouth 2 (two) times daily with a meal.    Cholecalciferol (VITAMIN D-3 PO) Take 1,000 Int'l Units/day by mouth daily.     CRANBERRY PO Take 100 mg by mouth daily.    Cyanocobalamin (VITAMIN B-12 PO) Take 1 tablet by mouth daily.    DULoxetine (CYMBALTA) 60 MG capsule Take 60 mg by mouth daily.    famotidine (PEPCID) 20 MG tablet Take 1 tablet (20 mg total) by mouth 2 (two) times daily.    feeding supplement, ENSURE ENLIVE, (ENSURE ENLIVE) LIQD Take 237 mLs by mouth 2 (two) times  daily as needed (Please provide to pt if she she eats less than 50% of breakfast and dinner). Qty: 237 mL, Refills: 12    latanoprost (XALATAN) 0.005 % ophthalmic solution Place 1 drop into both eyes at bedtime.    magnesium hydroxide (MILK OF MAGNESIA) 400 MG/5ML suspension Take 30 mLs by mouth every 8 (eight) hours as needed for mild constipation or moderate constipation.    mirabegron ER (MYRBETRIQ) 25 MG TB24 tablet Take 25 mg by mouth at bedtime.    ondansetron (ZOFRAN) 4 MG tablet Take 1 tablet (4 mg total) by mouth every 6 (six) hours as needed for nausea. Qty: 20 tablet, Refills: 0    polyethylene glycol (MIRALAX / GLYCOLAX) packet Take 17 g by mouth daily as needed for mild constipation. Qty: 120 packet, Refills: 11   Associated Diagnoses: Chronic constipation    potassium chloride (KLOR-CON) 20 MEQ packet Take  20 mEq by mouth daily.     Propylene Glycol (SYSTANE BALANCE) 0.6 % SOLN Place 1 drop into both eyes 2 (two) times daily.    senna-docusate (SENOKOT S) 8.6-50 MG tablet Take 1 tablet by mouth 2 (two) times daily.    spironolactone (ALDACTONE) 25 MG tablet Take 25 mg by mouth daily. For CHF    tamsulosin (FLOMAX) 0.4 MG CAPS capsule Take 0.4 mg by mouth daily. For incontinence      STOP taking these medications     ciprofloxacin (CIPRO) 500 MG tablet      fluconazole (DIFLUCAN) 150 MG tablet        Allergies  Allergen Reactions  . Sulfonamide Derivatives Hives and Shortness Of Breath  . Penicillins Hives and Itching    Has patient had a PCN reaction causing immediate rash, facial/tongue/throat swelling, SOB or lightheadedness with hypotension: unknown Has patient had a PCN reaction causing severe rash involving mucus membranes or skin necrosis: unknown Has patient had a PCN reaction that required hospitalization: unknown Has patient had a PCN reaction occurring within the last 10 years: unknown If all of the above answers are "NO", then may proceed with  Cephalosporin use. Prior course of rocephin charted 05/2015   Follow-up Information    Follow up with Margit Hanks, MD In 2 days.   Specialty:  Internal Medicine   Contact information:   [address] [place] [address] [phone]        The results of significant diagnostics from this hospitalization (including imaging, microbiology, ancillary and laboratory) are listed below for reference.    Significant Diagnostic Studies: Dg Chest 2 View  11/07/2015  CLINICAL DATA:  Altered mental status. EXAM: CHEST  2 VIEW COMPARISON:  July 20, 2015. FINDINGS: The heart size and mediastinal contours are within normal limits. Both lungs are clear. No pneumothorax or pleural effusion is noted. The visualized skeletal structures are unremarkable. IMPRESSION: No active cardiopulmonary disease. Electronically Signed   By: Lupita Raider, M.D.   On: 11/07/2015 15:11   Ct Head Wo Contrast  11/07/2015  CLINICAL DATA:  Altered mental status. EXAM: CT HEAD WITHOUT CONTRAST TECHNIQUE: Contiguous axial images were obtained from the base of the skull through the vertex without contrast. COMPARISON:  07/11/2015 and 07/10/2015 FINDINGS: Stable low-density in the periventricular white matter suggesting chronic changes. No evidence for acute hemorrhage, mass lesion, midline shift, hydrocephalus or large infarct. Extensive mucosal disease in the right maxillary sinus. Visualized mastoid air cells are clear. No calvarial fracture. IMPRESSION: No acute intracranial abnormality. White matter changes are suggestive for chronic small vessel ischemic disease. Right maxillary sinus disease. Electronically Signed   By: Richarda Overlie M.D.   On: 11/07/2015 15:19    Microbiology: Recent Results (from the past 240 hour(s))  Blood culture (routine x 2)     Status: None (Preliminary result)   Collection Time: 11/07/15  1:57 PM  Result Value Ref Range Status   Specimen Description BLOOD LEFT ANTECUBITAL  Final    Special Requests BOTTLES DRAWN AEROBIC AND ANAEROBIC 5 ML  Final   Culture   Final    NO GROWTH 2 DAYS Performed at [facility]    Report Status PENDING  Incomplete  Blood culture (routine x 2)     Status: None (Preliminary result)   Collection Time: 11/07/15  2:00 PM  Result Value Ref Range Status   Specimen Description BLOOD RIGHT HAND  Final   Special Requests IN PEDIATRIC BOTTLE  1 ML  Final   Culture   Final    NO GROWTH 2 DAYS Performed at [facility]    Report Status PENDING  Incomplete  Urine culture     Status: Abnormal   Collection Time: 11/07/15  4:05 PM  Result Value Ref Range Status   Specimen Description URINE, CATHETERIZED  Final   Special Requests NONE  Final   Culture >=100,000 COLONIES/mL PROTEUS MIRABILIS (A)  Final   Report Status 11/10/2015 FINAL  Final   Organism ID, Bacteria PROTEUS MIRABILIS (A)  Final      Susceptibility   Proteus mirabilis - MIC*    AMPICILLIN >=32 RESISTANT Resistant     CEFAZOLIN <=4 SENSITIVE Sensitive     CEFTRIAXONE <=1 SENSITIVE Sensitive     CIPROFLOXACIN 1 SENSITIVE Sensitive     GENTAMICIN <=1 SENSITIVE Sensitive     IMIPENEM 1 SENSITIVE Sensitive     NITROFURANTOIN 128 RESISTANT Resistant     TRIMETH/SULFA >=320 RESISTANT Resistant     AMPICILLIN/SULBACTAM 16 INTERMEDIATE Intermediate     PIP/TAZO <=4 SENSITIVE Sensitive     * >=100,000 COLONIES/mL PROTEUS MIRABILIS  MRSA PCR Screening     Status: None   Collection Time: 11/08/15  1:10 AM  Result Value Ref Range Status   MRSA by PCR NEGATIVE NEGATIVE Final    Comment:        The GeneXpert MRSA Assay (FDA approved for NASAL specimens only), is one component of a comprehensive MRSA colonization surveillance program. It is not intended to diagnose MRSA infection nor to guide or monitor treatment for MRSA infections.      Labs: Basic Metabolic Panel:  Recent Labs Lab 11/07/15 1406 11/08/15 0042 11/09/15 0441  NA 142 148* 143  K 4.5 4.3 4.1   CL 104 110 108  CO2 '27 28 24  '$ GLUCOSE 116* 130* 112*  BUN 23* 23* 22*  CREATININE 1.23* 1.24* 1.07*  CALCIUM 9.5 9.6 9.1   Liver Function Tests:  Recent Labs Lab 11/07/15 1406 11/08/15 0042  AST 22 21  ALT 23 20  ALKPHOS 153* 144*  BILITOT 0.5 0.4  PROT 8.4* 7.8  ALBUMIN 4.2 4.0   No results for input(s): LIPASE, AMYLASE in the last 168 hours.  Recent Labs Lab 11/07/15 2244  AMMONIA 22   CBC:  Recent Labs Lab 11/07/15 1406 11/08/15 0042  WBC 7.5 9.9  NEUTROABS 3.8  --   HGB 11.6* 11.0*  HCT 35.6* 33.8*  MCV 82.2 81.8  PLT 346 322   Cardiac Enzymes: No results for input(s): CKTOTAL, CKMB, CKMBINDEX, TROPONINI in the last 168 hours. BNP: BNP (last 3 results) No results for input(s): BNP in the last 8760 hours.  ProBNP (last 3 results) No results for input(s): PROBNP in the last 8760 hours.  CBG: No results for input(s): GLUCAP in the last 168 hours.     Signed:  Elmarie Shiley MD.  [facility] 11/10/2015, 10:48 AM

## 2015-11-10 NOTE — Discharge Instructions (Signed)
You have been seen today for a UTI. Please take all of your antibiotics until finished!   You may develop abdominal discomfort or diarrhea from the antibiotic.  You may help offset this with probiotics which you can buy or get in yogurt. Do not eat or take the probiotics until 2 hours after your antibiotic. If you do not take the prescribed antibiotics, the infection may return. Follow up with PCP as needed. Return to ED should symptoms worsen.

## 2015-11-10 NOTE — ED Notes (Signed)
Per EMS, pt from [facility].  Pt d/c today from [facility] with UTI.  Per staff, pt not behaving normally.  Pt answers all questions.  A/O x4.  Pt prescribed Cipro in facility and it was not started. Vitals:  128/86, hr 100, resp 18, 99% ra, cbg 119.  98.4

## 2015-11-10 NOTE — Progress Notes (Addendum)
Report called to [facility], Vincente Liberty LPN.

## 2015-11-10 NOTE — Progress Notes (Signed)
Speech Language Pathology Treatment:    Patient Details Name: Sreenidhi Ganson MRN: [id number] DOB: 1960-07-20 Today's Date: 11/10/2015 Time: 1991-4445 SLP Time Calculation (min) (ACUTE ONLY): 30 min  Assessment / Plan / Recommendation Clinical Impression  Pt seen to assure tolerance of dietary advancement (dys3/thin) = today pt responding well to SLP questions and reports improvement in palatability of food.  SLP observed pt self feeding cranberry juice x8 ounces.  Swallow was timely with clear voice throughout.  No indications of aspiration with thin liquids nor throat clearing that was observed yesterday.  Pt denies frequent throat clearing with liquids prior to admit and states her swallow function is near baseline.   Pt admits to issues with reflux and SLP advised her to aspiration/reflux precautions.  Recommend continue diet with precautions.  SLP to sign off, please reorder if indicated.    HPI HPI: 55 y.o. female with medical history significant for MS, HTN, depression, cardiomyopathy, neurogenic bladder, and recurrent UTIs; who presents with altered mental status, UTI.      SLP Plan  All goals met     Recommendations  Diet recommendations: Dysphagia 3 (mechanical soft);Thin liquid Liquids provided via: Straw;Cup Medication Administration: Whole meds with puree Supervision: Patient able to self feed Compensations: Slow rate;Small sips/bites Postural Changes and/or Swallow Maneuvers: Upright 30-60 min after meal;Seated upright 90 degrees             Follow up Recommendations: None Plan: All goals met     [place], [facility] SLP [phone]

## 2015-11-10 NOTE — ED Notes (Signed)
[facility] called to take pt back to [facility].

## 2015-11-10 NOTE — ED Notes (Signed)
Charge nurse speaking with facility regarding transfer.

## 2015-11-10 NOTE — ED Notes (Signed)
[facility] here to take pt back to [facility].  

## 2015-11-10 NOTE — Progress Notes (Signed)
[facility] notified facility of DC and confirmed patient room set up.  Notified family and patient of DC.  Sent Clinicals to facility (SNF referrals, FL2, DC Summary and Therapy Notes) Checked DNR, Confirmed Transport with [facility].  [place] signing off at this time.    Kathrin Greathouse, Latanya Presser, MSW Clinical Social Worker 5E and Psychiatric Service Line [phone] 11/10/2015  12:27 PM

## 2015-11-10 NOTE — ED Notes (Signed)
Attempted to call report to [facility]. No answer.

## 2015-11-10 NOTE — ED Notes (Signed)
Per EMS-states patient was just scene for UTI-was prescribed Cipro-returned to facility-states staff was unaware patient was back-states she was not acting right and sent her back to ED-patient did not want to return

## 2015-11-11 ENCOUNTER — Telehealth: Payer: Self-pay | Admitting: *Deleted

## 2015-11-11 NOTE — ED Notes (Signed)
Post ED Visit - Positive Culture Follow-up  Culture report reviewed by antimicrobial stewardship pharmacist:  []  Elenor Quinones, Pharm.D. []  Heide Guile, Pharm.D., BCPS []  Parks Neptune, Pharm.D. [x]  Alycia Rossetti, Pharm.D., BCPS []  Higginson, Pharm.D., BCPS, AAHIVP []  Legrand Como, Pharm.D., BCPS, AAHIVP []  Milus Glazier, Pharm.D. []  Stephens November, [place].D.  Positive urine culture Treated with Ciprofloxacin, organism sensitive to the same and no further patient follow-up is required at this time.  Harlon Flor [facility] 11/11/2015, 11:23 AM

## 2015-11-12 LAB — CULTURE, BLOOD (ROUTINE X 2)
Culture: NO GROWTH
Culture: NO GROWTH

## 2015-11-13 ENCOUNTER — Encounter: Payer: Self-pay | Admitting: Internal Medicine

## 2015-11-13 DIAGNOSIS — B964 Proteus (mirabilis) (morganii) as the cause of diseases classified elsewhere: Secondary | ICD-10-CM | POA: Insufficient documentation

## 2015-11-13 DIAGNOSIS — N390 Urinary tract infection, site not specified: Secondary | ICD-10-CM

## 2015-11-13 DIAGNOSIS — K219 Gastro-esophageal reflux disease without esophagitis: Secondary | ICD-10-CM | POA: Insufficient documentation

## 2015-11-13 DIAGNOSIS — G629 Polyneuropathy, unspecified: Secondary | ICD-10-CM | POA: Insufficient documentation

## 2015-11-13 NOTE — Assessment & Plan Note (Addendum)
SNF - chronic ans stable ; cont neurontin 700 mg BID

## 2015-11-13 NOTE — Assessment & Plan Note (Signed)
SNF - cont pepcid 20 mg BID

## 2015-11-13 NOTE — Assessment & Plan Note (Signed)
SNF - cont symetrel 100 mg daily

## 2015-11-13 NOTE — Assessment & Plan Note (Signed)
SNF - no exacerbations;cont lasic and goreg 25 mg BID

## 2015-11-13 NOTE — Assessment & Plan Note (Signed)
Secondary to urinary tract infection, acute kidney injury and hypernatremia. Resolved, back to baseline. Baclofen was on hold during admission. SNF - restatrt 10 mg TID

## 2015-11-13 NOTE — Assessment & Plan Note (Signed)
SNF - chronic and stable; cont vesicsare and fomax

## 2015-11-13 NOTE — Assessment & Plan Note (Signed)
SNF - controlled on coreg 25 mg BID, lasix 40 mg daily and spironolactone 25 mg daily

## 2015-11-13 NOTE — Assessment & Plan Note (Signed)
received 3 days of IV cefepime. Urine grew Proteous SNF - d/c with 4 more days of keflex.

## 2015-11-13 NOTE — Assessment & Plan Note (Signed)
SNF - not controlled at this time ;pt has something very specific on her ming; will consult Psych but pt would benefit from counseling

## 2015-11-13 NOTE — Assessment & Plan Note (Signed)
SNF - institute dysphagia 1 diet

## 2015-11-13 NOTE — Assessment & Plan Note (Signed)
SNF - cont replaqcement 1000u daily

## 2015-11-16 ENCOUNTER — Telehealth: Payer: Self-pay | Admitting: Neurology

## 2015-11-16 LAB — HEPATIC FUNCTION PANEL
ALT: 13 U/L (ref 7–35)
AST: 13 U/L (ref 13–35)
Alkaline Phosphatase: 126 U/L — AB (ref 25–125)
BILIRUBIN, TOTAL: 0.2 mg/dL

## 2015-11-16 LAB — CBC AND DIFFERENTIAL
HEMATOCRIT: 31 % — AB (ref 36–46)
HEMOGLOBIN: 9.4 g/dL — AB (ref 12.0–16.0)
Platelets: 249 10*3/uL (ref 150–399)
WBC: 8.2 10*3/mL

## 2015-11-16 LAB — BASIC METABOLIC PANEL
BUN: 15 mg/dL (ref 4–21)
Creatinine: 0.8 mg/dL (ref 0.5–1.1)
GLUCOSE: 89 mg/dL
POTASSIUM: 3.9 mmol/L (ref 3.4–5.3)
SODIUM: 141 mmol/L (ref 137–147)

## 2015-11-16 NOTE — Telephone Encounter (Signed)
I have spoken with [employer] and verified ins. info that we have.  They say [employer] is no longer good.  Message printed and given to Shannon Garrett in the infusion suite/fim

## 2015-11-16 NOTE — Telephone Encounter (Signed)
Shannon Garrett/[employer] [phone] needs updated insurance information.

## 2015-11-22 ENCOUNTER — Telehealth: Payer: Self-pay | Admitting: *Deleted

## 2015-11-22 NOTE — Telephone Encounter (Signed)
Message For: OFFICE               Taken 23-MAY-17 at 12:14PM by [facility] ------------------------------------------------------------ [place]    CID [id number]  Patient Shannon Garrett        Pt's Dr Felecia Shelling        Area Code 866 Phone# [phone]                         Disp:Y/N N If Y = C/B If No Response In 3minutes ============================================================ Shannon Garrett has message.

## 2015-12-01 DIAGNOSIS — G35 Multiple sclerosis: Secondary | ICD-10-CM | POA: Diagnosis not present

## 2015-12-01 DIAGNOSIS — R1311 Dysphagia, oral phase: Secondary | ICD-10-CM | POA: Diagnosis not present

## 2015-12-02 DIAGNOSIS — G35 Multiple sclerosis: Secondary | ICD-10-CM | POA: Diagnosis not present

## 2015-12-02 DIAGNOSIS — R1311 Dysphagia, oral phase: Secondary | ICD-10-CM | POA: Diagnosis not present

## 2015-12-05 DIAGNOSIS — R1311 Dysphagia, oral phase: Secondary | ICD-10-CM | POA: Diagnosis not present

## 2015-12-05 DIAGNOSIS — G35 Multiple sclerosis: Secondary | ICD-10-CM | POA: Diagnosis not present

## 2015-12-06 DIAGNOSIS — R1311 Dysphagia, oral phase: Secondary | ICD-10-CM | POA: Diagnosis not present

## 2015-12-06 DIAGNOSIS — G35 Multiple sclerosis: Secondary | ICD-10-CM | POA: Diagnosis not present

## 2015-12-07 DIAGNOSIS — G35 Multiple sclerosis: Secondary | ICD-10-CM | POA: Diagnosis not present

## 2015-12-07 DIAGNOSIS — R1311 Dysphagia, oral phase: Secondary | ICD-10-CM | POA: Diagnosis not present

## 2015-12-08 DIAGNOSIS — R1311 Dysphagia, oral phase: Secondary | ICD-10-CM | POA: Diagnosis not present

## 2015-12-08 DIAGNOSIS — G35 Multiple sclerosis: Secondary | ICD-10-CM | POA: Diagnosis not present

## 2015-12-09 ENCOUNTER — Encounter: Payer: Self-pay | Admitting: Adult Health

## 2015-12-09 ENCOUNTER — Non-Acute Institutional Stay (SKILLED_NURSING_FACILITY): Payer: Medicare Other | Admitting: Adult Health

## 2015-12-09 DIAGNOSIS — E739 Lactose intolerance, unspecified: Secondary | ICD-10-CM

## 2015-12-09 DIAGNOSIS — B373 Candidiasis of vulva and vagina: Secondary | ICD-10-CM | POA: Diagnosis not present

## 2015-12-09 DIAGNOSIS — R1311 Dysphagia, oral phase: Secondary | ICD-10-CM | POA: Diagnosis not present

## 2015-12-09 DIAGNOSIS — G35 Multiple sclerosis: Secondary | ICD-10-CM | POA: Diagnosis not present

## 2015-12-09 DIAGNOSIS — B3731 Acute candidiasis of vulva and vagina: Secondary | ICD-10-CM

## 2015-12-09 NOTE — Progress Notes (Signed)
Patient ID: Shannon Garrett, female   DOB: 1960/08/01, 55 y.o.   MRN: [id number]    Location:  [place] Room Number: 230-B Place of Service:  SNF (31)   CODE STATUS: DNR  Allergies  Allergen Reactions  . Sulfonamide Derivatives Hives and Shortness Of Breath  . Penicillins Hives and Itching    Has patient had a PCN reaction causing immediate rash, facial/tongue/throat swelling, SOB or lightheadedness with hypotension: unknown Has patient had a PCN reaction causing severe rash involving mucus membranes or skin necrosis: unknown Has patient had a PCN reaction that required hospitalization: unknown Has patient had a PCN reaction occurring within the last 10 years: unknown If all of the above answers are "NO", then may proceed with Cephalosporin use. Prior course of rocephin charted 05/2015    Chief Complaint  Patient presents with  . Acute Visit    Yeast Infection    HPI:  She is complaining of vaginal yeast infection. She states that she has had several rounds of abt which is more than likely the cause of her infection. She is also compaining of having lactose intolerance. We did discuss lactaid.   Past Medical History  Diagnosis Date  . MS (multiple sclerosis) ([place])     a. Dx'd late 20's. b. Tx with Novantrone, Tysabri, Copaxone previously.  Marland Kitchen Hypertension   . Depression   . Glaucoma   . Cardiomyopathy ([place])     a.  Echo 04/29/12: Mild LVH, EF 20-25%, mild AI, moderate MR, moderate LAE, mild RAE, mild RVE, moderate TR, PASP 51, small pericardial effusion;   b. probably non-ischemic given multiple chemo-Tx agents used for MS and global LV dysfn on echo  . Chronic systolic heart failure [facility])     Past Surgical History  Procedure Laterality Date  . Ablation      uterine  . Cesarean section      Social History   Social History  . Marital Status: Single    Spouse Name: N/A  . Number of Children: N/A  . Years of Education: N/A    Occupational History  . Not on file.   Social History Main Topics  . Smoking status: Former Research scientist (life sciences)  . Smokeless tobacco: Never Used  . Alcohol Use: No  . Drug Use: No  . Sexual Activity: Not Currently   Other Topics Concern  . Not on file   Social History Narrative   Family History  Problem Relation Age of Onset  . Hypertension Mother   . Heart attack Neg Hx   . Cancer Mother     breast   . Cancer Father     prostate  . Multiple sclerosis Sister       VITAL SIGNS BP 118/57 mmHg  Pulse 101  Temp(Src) 97.1 F (36.2 C) (Oral)  Resp 24  Ht '5\' 5"'$  (1.651 m)  Wt 224 lb (101.606 kg)  BMI 37.28 kg/m2  SpO2 99%  Patient's Medications  New Prescriptions   No medications on file  Previous Medications   ACETAMINOPHEN (TYLENOL) 325 MG TABLET    Take 2 tablets (650 mg total) by mouth every 6 (six) hours as needed for mild pain or fever (or Fever >/= 101).   ALBUTEROL (PROVENTIL) (2.5 MG/3ML) 0.083% NEBULIZER SOLUTION    Take 3 mLs (2.5 mg total) by nebulization every 2 (two) hours as needed for wheezing.   AMANTADINE (SYMMETREL) 100 MG CAPSULE    Take 100 mg by mouth daily.  BACLOFEN (LIORESAL) 20 MG TABLET    Take 1 tablet (20 mg total) by mouth 3 (three) times daily as needed for muscle spasms.   CARVEDILOL (COREG) 25 MG TABLET    Take 1 tablet (25 mg total) by mouth 2 (two) times daily with a meal.   CHOLECALCIFEROL (VITAMIN D-3 PO)    Take 1,000 Int'l Units/day by mouth daily.    CRANBERRY PO    Take 100 mg by mouth daily.   CYANOCOBALAMIN (VITAMIN B-12 PO)    Take 1 tablet by mouth daily.   DULOXETINE (CYMBALTA) 60 MG CAPSULE    Take 60 mg by mouth daily.   FAMOTIDINE (PEPCID) 20 MG TABLET    Take 1 tablet (20 mg total) by mouth 2 (two) times daily.   FUROSEMIDE (LASIX) 40 MG TABLET    Take 1 tablet (40 mg total) by mouth daily.   GABAPENTIN (NEURONTIN) 100 MG CAPSULE    Take 7 capsules (700 mg total) by mouth 2 (two) times daily.   LATANOPROST (XALATAN) 0.005 %  OPHTHALMIC SOLUTION    Place 1 drop into both eyes at bedtime.   MAGNESIUM HYDROXIDE (MILK OF MAGNESIA) 400 MG/5ML SUSPENSION    Take 30 mLs by mouth every 8 (eight) hours as needed for mild constipation or moderate constipation.   MIRABEGRON ER (MYRBETRIQ) 25 MG TB24 TABLET    Take 25 mg by mouth at bedtime.   ONDANSETRON (ZOFRAN) 4 MG TABLET    Take 1 tablet (4 mg total) by mouth every 6 (six) hours as needed for nausea.   POLYETHYLENE GLYCOL (MIRALAX / GLYCOLAX) PACKET    Take 17 g by mouth daily as needed for mild constipation.   POTASSIUM CHLORIDE (KLOR-CON) 20 MEQ PACKET    Take 20 mEq by mouth daily.    PROPYLENE GLYCOL (SYSTANE BALANCE) 0.6 % SOLN    Place 1 drop into both eyes 2 (two) times daily.   SENNA-DOCUSATE (SENOKOT S) 8.6-50 MG TABLET    Take 1 tablet by mouth 2 (two) times daily.   SPIRONOLACTONE (ALDACTONE) 25 MG TABLET    Take 25 mg by mouth daily. For CHF   TAMSULOSIN (FLOMAX) 0.4 MG CAPS CAPSULE    Take 0.4 mg by mouth daily. For incontinence   ZOLPIDEM (AMBIEN) 5 MG TABLET    Take 5 mg by mouth at bedtime.  Modified Medications   No medications on file  Discontinued Medications   CIPROFLOXACIN (CIPRO) 500 MG TABLET    Take 1 tablet (500 mg total) by mouth 2 (two) times daily.   FEEDING SUPPLEMENT, ENSURE ENLIVE, (ENSURE ENLIVE) LIQD    Take 237 mLs by mouth 2 (two) times daily as needed (Please provide to pt if she she eats less than 50% of breakfast and dinner).   LATANOPROST (XALATAN) 0.005 % OPHTHALMIC SOLUTION    Place 1 drop into both eyes at bedtime. Reported on 12/09/2015     SIGNIFICANT DIAGNOSTIC EXAMS  10-08-14: MRI brain: No significant change in diffuse white matter lesions consistent with patient's history of multiple sclerosis. None of these areas demonstrate abnormal restriction or enhancement as may be seen with areas of acute demyelination. Global atrophy. Decrease in size right pituitary mass now measuring 1.2 x 0.5 x 0.9 cm versus prior 1.2 x 0.7 x 1.1  cm. Right parotid 1 cm lesion does not have typical appearance of an intra parotid lymph node and therefore primary parotid lesion is a possibility. ENT consultation may be considered for further delineation.  10-08-14: mri of cervical; spine: Scattered new areas of altered signal intensity (compared to 2007) throughout the cervical cord as detailed above consistent with patient's history of multiple sclerosis. None of these areas demonstrate enhancement as may be seen with areas of active demyelination. Cervical spondylotic changes as detailed above most prominent C5-6 level.  12-13-14: ABI: right: mild atherosclerotic disease   01-10-15: ct of head: No acute intracranial abnormalities. Mild atrophy. White matter disease compatible with history of MS.  01-10-15: chest x-ray: No active disease  01-12-15: 2-d echo: Left ventricle: The cavity size was normal. There was mild concentric hypertrophy. Systolic function was normal. The estimated ejection fraction was in the range of 60% to 65%. Wall motion was normal; there were no regional wall motion abnormalities. Doppler parameters are consistent with abnormal left ventricular relaxation (grade 1 diastolic dysfunction). - Aortic valve: There was mild regurgitation. - Mitral valve: Calcified annulus. There was mild regurgitation.  05-30-15: chest x-ray: No active disease.    LABS REVIEWED:   01-10-15: wbc 6.2; hgb 11.3; hct 34.0; mcv 84.2; plt 291; glucose 101; bun 21; creat 0.89; k+4.5; na++140; alk phos 138; albumin 3.4; ;urine culture: enterococcus 01-11-15: wbc 6.3; hgb 10.2; hct 31.2; mcv 83.9; ;plt 256; glucose 112; bun 17; creat 0.79; k+3.9; na++145; liver normal albumin 3.1; hgb a1c 5.3; tsh 1.209 01-12-15: HIV: nr; RPR: nr; ammonia 30; iron 50; tibc 253; folate 24.2; vit b 12: 2487  04-18-15: enterococcus faecalis: macrobid  05-30-15: urine culture: multiple bacteria  06-02-15: wbc 12.2; hgb 10.4; hct 32.0; mcv 84.7; plt 257; glucose 119; bun  18; creat 0.99; k+ 3.5; na++150; liver normal albumin 3.3  07-10-15: tsh 0.557; vit B12: 2449 07-23-15: wbc 7.9; hgb 8.7; hct 25.5; mcv 79.4; plt 304; glucose 135; bun 7; creat 0.67; k+ 4.9; na++142; mag 2.2  08-19-15: wbc 9.9; hgb 10.6; hct 33.2; mcv 81.8; plt 400 glucose 105; bun 11.0; creat 1.13; k+ 4.1; na++141; liver normal albumin 3.7  urine culture: proteus mirabilis: cipro  11-16-15: wbc 8.2; hgb 9.4; hct 30.9; mcv 82.5 ;plt 249; glucose 89; bun 20.0; creat 0.75; k+ 3.9; na++ 141; liver normal albumin 3.5     Review of Systems  Constitutional: Negative for malaise/fatigue.  Respiratory: Negative for cough and shortness of breath.   Cardiovascular: Negative for chest pain, palpitations and leg swelling.  Gastrointestinal: Negative for heartburn, abdominal pain and constipation.  Musculoskeletal: Negative for myalgias, back pain and joint pain.  Skin: Negative.   Neurological: Negative for dizziness.  Psychiatric/Behavioral: The patient is not nervous/anxious.      Physical Exam Constitutional: . No distress.  Overweight   Neck: Neck supple. No JVD present. No thyromegaly present.  Cardiovascular: Normal rate, regular rhythm and intact distal pulses.   Respiratory: Effort normal and breath sounds normal. No respiratory distress.  GI: Soft. Bowel sounds are normal. She exhibits no distension. There is no tenderness. Has vaginal yeast infection drainage   Musculoskeletal: She exhibits no edema.  Is able to move right  upper extremity Limited movement in her lower extremities  and left upper extremity  Neurological: alert and oriented  Skin: Skin is warm and dry. She is not diaphoretic.       ASSESSMENT/ PLAN:  1. Vaginal yeast infection: will begin diflucan 150 mg today and repeat in 72 hours  2. Lactose intolerance: will begin lactaid three times daily with meals.     Ok Edwards NP [facility]  Contact [phone] Monday through Friday 8am- 5pm  After  hours call [phone]

## 2015-12-11 DIAGNOSIS — R1311 Dysphagia, oral phase: Secondary | ICD-10-CM | POA: Diagnosis not present

## 2015-12-11 DIAGNOSIS — G35 Multiple sclerosis: Secondary | ICD-10-CM | POA: Diagnosis not present

## 2015-12-12 DIAGNOSIS — R1311 Dysphagia, oral phase: Secondary | ICD-10-CM | POA: Diagnosis not present

## 2015-12-12 DIAGNOSIS — G35 Multiple sclerosis: Secondary | ICD-10-CM | POA: Diagnosis not present

## 2015-12-13 DIAGNOSIS — R1311 Dysphagia, oral phase: Secondary | ICD-10-CM | POA: Diagnosis not present

## 2015-12-13 DIAGNOSIS — G35 Multiple sclerosis: Secondary | ICD-10-CM | POA: Diagnosis not present

## 2015-12-14 DIAGNOSIS — R1311 Dysphagia, oral phase: Secondary | ICD-10-CM | POA: Diagnosis not present

## 2015-12-14 DIAGNOSIS — G35 Multiple sclerosis: Secondary | ICD-10-CM | POA: Diagnosis not present

## 2015-12-16 DIAGNOSIS — R1311 Dysphagia, oral phase: Secondary | ICD-10-CM | POA: Diagnosis not present

## 2015-12-16 DIAGNOSIS — G35 Multiple sclerosis: Secondary | ICD-10-CM | POA: Diagnosis not present

## 2015-12-19 DIAGNOSIS — R1311 Dysphagia, oral phase: Secondary | ICD-10-CM | POA: Diagnosis not present

## 2015-12-19 DIAGNOSIS — G35 Multiple sclerosis: Secondary | ICD-10-CM | POA: Diagnosis not present

## 2015-12-20 ENCOUNTER — Encounter: Payer: Self-pay | Admitting: Adult Health

## 2015-12-20 ENCOUNTER — Non-Acute Institutional Stay (SKILLED_NURSING_FACILITY): Payer: Medicare Other | Admitting: Adult Health

## 2015-12-20 DIAGNOSIS — R1311 Dysphagia, oral phase: Secondary | ICD-10-CM | POA: Diagnosis not present

## 2015-12-20 DIAGNOSIS — N319 Neuromuscular dysfunction of bladder, unspecified: Secondary | ICD-10-CM

## 2015-12-20 DIAGNOSIS — G35 Multiple sclerosis: Secondary | ICD-10-CM | POA: Diagnosis not present

## 2015-12-20 DIAGNOSIS — G629 Polyneuropathy, unspecified: Secondary | ICD-10-CM

## 2015-12-20 DIAGNOSIS — I110 Hypertensive heart disease with heart failure: Secondary | ICD-10-CM | POA: Diagnosis not present

## 2015-12-20 DIAGNOSIS — R627 Adult failure to thrive: Secondary | ICD-10-CM | POA: Diagnosis not present

## 2015-12-20 DIAGNOSIS — I69391 Dysphagia following cerebral infarction: Secondary | ICD-10-CM

## 2015-12-20 DIAGNOSIS — D539 Nutritional anemia, unspecified: Secondary | ICD-10-CM

## 2015-12-20 DIAGNOSIS — I5042 Chronic combined systolic (congestive) and diastolic (congestive) heart failure: Secondary | ICD-10-CM

## 2015-12-20 NOTE — Progress Notes (Signed)
Location:  [place] Room Number: 230 B Place of Service:  SNF (31)   CODE STATUS: DNR  Allergies  Allergen Reactions  . Sulfonamide Derivatives Hives and Shortness Of Breath  . Penicillins Hives and Itching    Has patient had a PCN reaction causing immediate rash, facial/tongue/throat swelling, SOB or lightheadedness with hypotension: unknown Has patient had a PCN reaction causing severe rash involving mucus membranes or skin necrosis: unknown Has patient had a PCN reaction that required hospitalization: unknown Has patient had a PCN reaction occurring within the last 10 years: unknown If all of the above answers are "NO", then may proceed with Cephalosporin use. Prior course of rocephin charted 05/2015    Chief Complaint  Patient presents with  . Medical Management of Chronic Issues    Routine Visit    HPI:  She is a long term resident of this facility being seen for the management of her chronic illnesses. Overall there is little change in her status. She is not voicing any concerns or complaints at this time. There are no nursing concerns at this time.  She does get out of bed on most days.    Past Medical History  Diagnosis Date  . MS (multiple sclerosis) ([place])     a. Dx'd late 20's. b. Tx with Novantrone, Tysabri, Copaxone previously.  Marland Kitchen Hypertension   . Depression   . Glaucoma   . Cardiomyopathy ([place])     a.  Echo 04/29/12: Mild LVH, EF 20-25%, mild AI, moderate MR, moderate LAE, mild RAE, mild RVE, moderate TR, PASP 51, small pericardial effusion;   b. probably non-ischemic given multiple chemo-Tx agents used for MS and global LV dysfn on echo  . Chronic systolic heart failure [facility])     Past Surgical History  Procedure Laterality Date  . Ablation      uterine  . Cesarean section      Social History   Social History  . Marital Status: Single    Spouse Name: N/A  . Number of Children: N/A  . Years of Education: N/A     Occupational History  . Not on file.   Social History Main Topics  . Smoking status: Former Research scientist (life sciences)  . Smokeless tobacco: Never Used  . Alcohol Use: No  . Drug Use: No  . Sexual Activity: Not Currently   Other Topics Concern  . Not on file   Social History Narrative   Family History  Problem Relation Age of Onset  . Hypertension Mother   . Heart attack Neg Hx   . Cancer Mother     breast   . Cancer Father     prostate  . Multiple sclerosis Sister       VITAL SIGNS BP 118/57 mmHg  Pulse 101  Temp(Src) 97.1 F (36.2 C) (Oral)  Resp 24  Ht '5\' 5"'$  (1.651 m)  Wt 224 lb (101.606 kg)  BMI 37.28 kg/m2  Patient's Medications  New Prescriptions   No medications on file  Previous Medications   ACETAMINOPHEN (TYLENOL) 325 MG TABLET    Take 2 tablets (650 mg total) by mouth every 6 (six) hours as needed for mild pain or fever (or Fever >/= 101).   ALBUTEROL (PROVENTIL) (2.5 MG/3ML) 0.083% NEBULIZER SOLUTION    Take 3 mLs (2.5 mg total) by nebulization every 2 (two) hours as needed for wheezing.   AMANTADINE (SYMMETREL) 100 MG CAPSULE    Take 100 mg by mouth  daily.    BACLOFEN (LIORESAL) 20 MG TABLET    Take 1 tablet (20 mg total) by mouth 3 (three) times daily as needed for muscle spasms.   CARVEDILOL (COREG) 25 MG TABLET    Take 1 tablet (25 mg total) by mouth 2 (two) times daily with a meal.   CHOLECALCIFEROL (VITAMIN D-3 PO)    Take 1,000 Int'l Units/day by mouth daily.    CRANBERRY PO    Take 100 mg by mouth daily.   CYANOCOBALAMIN (VITAMIN B-12 PO)    Take 1 tablet by mouth daily.   DULOXETINE (CYMBALTA) 60 MG CAPSULE    Take 60 mg by mouth daily.   FAMOTIDINE (PEPCID) 20 MG TABLET    Take 1 tablet (20 mg total) by mouth 2 (two) times daily.   FUROSEMIDE (LASIX) 40 MG TABLET    Take 1 tablet (40 mg total) by mouth daily.   GABAPENTIN (NEURONTIN) 100 MG CAPSULE    Take 7 capsules (700 mg total) by mouth 2 (two) times daily.   LACTASE (LACTAID PO)    Take 1 tablet by  mouth daily.   LATANOPROST (XALATAN) 0.005 % OPHTHALMIC SOLUTION    Place 1 drop into both eyes at bedtime.   MAGNESIUM HYDROXIDE (MILK OF MAGNESIA) 400 MG/5ML SUSPENSION    Take 30 mLs by mouth every 8 (eight) hours as needed for mild constipation or moderate constipation.   MIRABEGRON ER (MYRBETRIQ) 25 MG TB24 TABLET    Take 25 mg by mouth at bedtime.   ONDANSETRON (ZOFRAN) 4 MG TABLET    Take 1 tablet (4 mg total) by mouth every 6 (six) hours as needed for nausea.   POLYETHYLENE GLYCOL (MIRALAX / GLYCOLAX) PACKET    Take 17 g by mouth daily as needed for mild constipation.   POTASSIUM CHLORIDE (KLOR-CON) 20 MEQ PACKET    Take 20 mEq by mouth daily.    PROPYLENE GLYCOL (SYSTANE BALANCE) 0.6 % SOLN    Place 1 drop into both eyes 2 (two) times daily.   SENNA-DOCUSATE (SENOKOT S) 8.6-50 MG TABLET    Take 1 tablet by mouth 2 (two) times daily.   SPIRONOLACTONE (ALDACTONE) 25 MG TABLET    Take 25 mg by mouth daily. For CHF   TAMSULOSIN (FLOMAX) 0.4 MG CAPS CAPSULE    Take 0.4 mg by mouth daily. For incontinence   ZOLPIDEM (AMBIEN) 5 MG TABLET    Take 5 mg by mouth at bedtime.  Modified Medications   No medications on file  Discontinued Medications   No medications on file     SIGNIFICANT DIAGNOSTIC EXAMS    10-08-14: MRI brain: No significant change in diffuse white matter lesions consistent with patient's history of multiple sclerosis. None of these areas demonstrate abnormal restriction or enhancement as may be seen with areas of acute demyelination. Global atrophy. 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 Right parotid 1 cm lesion does not have typical appearance of an intra parotid lymph node and therefore primary parotid lesion is a possibility. ENT consultation may be considered for further delineation.  10-08-14: mri of cervical; spine: Scattered new areas of altered signal intensity (compared to 2007) throughout the  cervical cord as detailed above consistent with patient's history of multiple sclerosis. None of these areas demonstrate enhancement as may be seen with areas of active demyelination. Cervical spondylotic changes as detailed above most prominent C5-6 level.  12-13-14: ABI: right: mild atherosclerotic disease   01-10-15: ct of head: No acute intracranial abnormalities. Mild atrophy. White matter disease compatible with history of MS.  01-10-15: chest x-ray: No active disease  01-12-15: 2-d echo: Left ventricle: The cavity size was normal. There was mild concentric hypertrophy. Systolic function was normal. The estimated ejection fraction was in the range of 60% to 65%. Wall motion was normal; there were no regional wall motion abnormalities. Doppler parameters are consistent with abnormal left ventricular relaxation (grade 1 diastolic dysfunction). - Aortic valve: There was mild regurgitation. - Mitral valve: Calcified annulus. There was mild regurgitation.  05-30-15: chest x-ray: No active disease.  07-14-15: EEG: This EEG is consistent with a mild generalized non-specific cerebral dysfunction(encephalopathy).   07-20-15: chest x-ray; Interval extubation. Decreasing lung volumes with increasing bibasilar opacities, likely atelectasis.  11-07-15: ct of head; No acute intracranial abnormality. White matter changes are suggestive for chronic small vessel ischemic disease. Right maxillary sinus disease.     LABS REVIEWED:   01-10-15: wbc 6.2; hgb 11.3; hct 34.0; mcv 84.2; plt 291; glucose 101; bun 21; creat 0.89; k+4.5; na++140; alk phos 138; albumin 3.4; ;urine culture: enterococcus 01-11-15: wbc 6.3; hgb 10.2; hct 31.2; mcv 83.9; ;plt 256; glucose 112; bun 17; creat 0.79; k+3.9; na++145; liver normal albumin 3.1; hgb a1c 5.3; tsh 1.209 01-12-15: HIV: nr; RPR: nr; ammonia 30; iron 50; tibc 253; folate 24.2; vit b 12: 2487  04-18-15: enterococcus faecalis: macrobid  05-30-15: urine culture: multiple  bacteria  06-02-15: wbc 12.2; hgb 10.4; hct 32.0; mcv 84.7; plt 257; glucose 119; bun 18; creat 0.99; k+ 3.5; na++150; liver normal albumin 3.3  07-10-15: tsh 0.557; vit B12: 2449 07-23-15: wbc 7.9; hgb 8.7; hct 25.5; mcv 79.4; plt 304; glucose 135; bun 7; creat 0.67; k+ 4.9; na++142; mag 2.2  08-19-15: wbc 9.9; hgb 10.6; hct 33.2; mcv 81.8; plt 400 glucose 105; bun 11.0; creat 1.13; k+ 4.1; na++141; liver normal albumin 3.7  urine culture: proteus mirabilis: cipro  11-16-15: wbc 8.2; hgb 9.4; hct 30.9; mcv 82.5; plt 249 glucose 89; bun 15.0; creat 0.75; k+ 3.9; na++ 141; liver normal albumin 3.5     Review of Systems  Constitutional: Negative for malaise/fatigue.  Respiratory: Negative for cough and shortness of breath.   Cardiovascular: Negative for chest pain, palpitations and leg swelling.  Gastrointestinal: Negative for heartburn, abdominal pain and constipation.  Musculoskeletal: Negative for myalgias, back pain and joint pain.  Skin: Negative.   Neurological: Negative for dizziness.  Psychiatric/Behavioral: The patient is not nervous/anxious.      Physical Exam Constitutional: . No distress.  Overweight   Neck: Neck supple. No JVD present. No thyromegaly present.  Cardiovascular: Normal rate, regular rhythm and intact distal pulses.   Respiratory: Effort normal and breath sounds normal. No respiratory distress.  GI: Soft. Bowel sounds are normal. She exhibits no distension. There is no tenderness.  Musculoskeletal: She exhibits no edema.  Is able to move right  upper extremity Limited movement in her lower extremities  and left upper extremity  Neurological: lethargic  .  Skin: Skin is warm and dry. She is not diaphoretic.       ASSESSMENT/ PLAN: 1. MS: is slowly declining   Has been  followed by  neurology;  Has paraplegia.will continue amantadine 100 mg daily baclofen 20 mg tid prn  for muscle spasms;   Not certain if she is on the   tysabri infusions every 2 weeks.will have  staff contact neurology regarding  this medication    2. Hypertension: will continue coreg 25 mg twice daily and will monitor   3. Chronic combined systolic and diastolic heart failure: is stable will continue lasix 40 mg daily; with k+ 20 meq daily; will continue aldactone 25 mg daily; her EF is 60-65%.   4. Neurogenic bladder: takes myebetriq 25 m  daily and flomax 0.4 mg daily    5. Depression: is presently taking cymbalta 60 mg daily; which also helps with her pain management.  Is taking ambien 5 mg nightly for sleep   6. Chronic pain: she is presently stable   will continue  neurontin 700 mg twice  daily; takes cymbalta 60 mg daily    9. Constipation: will continue  miralax twice  daily as needed and senna s twice daily   10. Gerd: will continue pepcid 20 mg twice daily   11. Glaucoma: will continue xalatan to both eyes.           Ok Edwards NP [facility]  Contact [phone] Monday through Friday 8am- 5pm  After hours call [phone]

## 2015-12-21 DIAGNOSIS — R1311 Dysphagia, oral phase: Secondary | ICD-10-CM | POA: Diagnosis not present

## 2015-12-21 DIAGNOSIS — G35 Multiple sclerosis: Secondary | ICD-10-CM | POA: Diagnosis not present

## 2015-12-22 DIAGNOSIS — R1311 Dysphagia, oral phase: Secondary | ICD-10-CM | POA: Diagnosis not present

## 2015-12-22 DIAGNOSIS — G35 Multiple sclerosis: Secondary | ICD-10-CM | POA: Diagnosis not present

## 2015-12-23 DIAGNOSIS — R1311 Dysphagia, oral phase: Secondary | ICD-10-CM | POA: Diagnosis not present

## 2015-12-23 DIAGNOSIS — G35 Multiple sclerosis: Secondary | ICD-10-CM | POA: Diagnosis not present

## 2015-12-26 DIAGNOSIS — R1311 Dysphagia, oral phase: Secondary | ICD-10-CM | POA: Diagnosis not present

## 2015-12-26 DIAGNOSIS — G35 Multiple sclerosis: Secondary | ICD-10-CM | POA: Diagnosis not present

## 2015-12-27 DIAGNOSIS — G35 Multiple sclerosis: Secondary | ICD-10-CM | POA: Diagnosis not present

## 2015-12-27 DIAGNOSIS — R1311 Dysphagia, oral phase: Secondary | ICD-10-CM | POA: Diagnosis not present

## 2015-12-28 DIAGNOSIS — G35 Multiple sclerosis: Secondary | ICD-10-CM | POA: Diagnosis not present

## 2015-12-28 DIAGNOSIS — R1311 Dysphagia, oral phase: Secondary | ICD-10-CM | POA: Diagnosis not present

## 2015-12-29 ENCOUNTER — Telehealth: Payer: Self-pay | Admitting: Neurology

## 2015-12-29 DIAGNOSIS — R1311 Dysphagia, oral phase: Secondary | ICD-10-CM | POA: Diagnosis not present

## 2015-12-29 DIAGNOSIS — G35 Multiple sclerosis: Secondary | ICD-10-CM | POA: Diagnosis not present

## 2015-12-29 NOTE — Telephone Encounter (Signed)
Moji with [employer] called to see if pt is still on Tysabri, Please call [phone] ext : [phone]

## 2015-12-29 NOTE — Telephone Encounter (Signed)
I have spoken with Shannon Garrett and advised that pt. is still on Tysabri, however she lives in a nsg. facility and it has been difficult to get her in for infusions.  Appt's are made, but she often misses them.  Her last infusion was 10-25-15/fim

## 2015-12-30 DIAGNOSIS — G35 Multiple sclerosis: Secondary | ICD-10-CM | POA: Diagnosis not present

## 2015-12-30 DIAGNOSIS — R1311 Dysphagia, oral phase: Secondary | ICD-10-CM | POA: Diagnosis not present

## 2016-01-02 DIAGNOSIS — R1311 Dysphagia, oral phase: Secondary | ICD-10-CM | POA: Diagnosis not present

## 2016-01-02 DIAGNOSIS — G35 Multiple sclerosis: Secondary | ICD-10-CM | POA: Diagnosis not present

## 2016-01-03 DIAGNOSIS — G35 Multiple sclerosis: Secondary | ICD-10-CM | POA: Diagnosis not present

## 2016-01-03 DIAGNOSIS — R1311 Dysphagia, oral phase: Secondary | ICD-10-CM | POA: Diagnosis not present

## 2016-01-04 DIAGNOSIS — R1311 Dysphagia, oral phase: Secondary | ICD-10-CM | POA: Diagnosis not present

## 2016-01-04 DIAGNOSIS — G35 Multiple sclerosis: Secondary | ICD-10-CM | POA: Diagnosis not present

## 2016-01-05 DIAGNOSIS — R1311 Dysphagia, oral phase: Secondary | ICD-10-CM | POA: Diagnosis not present

## 2016-01-05 DIAGNOSIS — G35 Multiple sclerosis: Secondary | ICD-10-CM | POA: Diagnosis not present

## 2016-01-06 DIAGNOSIS — R1311 Dysphagia, oral phase: Secondary | ICD-10-CM | POA: Diagnosis not present

## 2016-01-06 DIAGNOSIS — G35 Multiple sclerosis: Secondary | ICD-10-CM | POA: Diagnosis not present

## 2016-01-09 DIAGNOSIS — R1311 Dysphagia, oral phase: Secondary | ICD-10-CM | POA: Diagnosis not present

## 2016-01-09 DIAGNOSIS — G35 Multiple sclerosis: Secondary | ICD-10-CM | POA: Diagnosis not present

## 2016-01-10 DIAGNOSIS — R1311 Dysphagia, oral phase: Secondary | ICD-10-CM | POA: Diagnosis not present

## 2016-01-10 DIAGNOSIS — G35 Multiple sclerosis: Secondary | ICD-10-CM | POA: Diagnosis not present

## 2016-01-11 ENCOUNTER — Non-Acute Institutional Stay (SKILLED_NURSING_FACILITY): Payer: Medicare Other | Admitting: Adult Health

## 2016-01-11 ENCOUNTER — Encounter: Payer: Self-pay | Admitting: Adult Health

## 2016-01-11 DIAGNOSIS — R635 Abnormal weight gain: Secondary | ICD-10-CM

## 2016-01-11 DIAGNOSIS — N3946 Mixed incontinence: Secondary | ICD-10-CM

## 2016-01-11 DIAGNOSIS — R1311 Dysphagia, oral phase: Secondary | ICD-10-CM | POA: Diagnosis not present

## 2016-01-11 DIAGNOSIS — G35 Multiple sclerosis: Secondary | ICD-10-CM | POA: Diagnosis not present

## 2016-01-11 DIAGNOSIS — N390 Urinary tract infection, site not specified: Secondary | ICD-10-CM

## 2016-01-11 NOTE — Progress Notes (Signed)
Patient ID: Shannon Garrett, female   DOB: 06-23-1961, 55 y.o.   MRN: [id number]   Location:   [employer] Room Number: 230-B Place of Service:  SNF (31)   CODE STATUS: DNR  Allergies  Allergen Reactions  . Sulfonamide Derivatives Hives and Shortness Of Breath  . Penicillins Hives and Itching    Has patient had a PCN reaction causing immediate rash, facial/tongue/throat swelling, SOB or lightheadedness with hypotension: unknown Has patient had a PCN reaction causing severe rash involving mucus membranes or skin necrosis: unknown Has patient had a PCN reaction that required hospitalization: unknown Has patient had a PCN reaction occurring within the last 10 years: unknown If all of the above answers are "NO", then may proceed with Cephalosporin use. Prior course of rocephin charted 05/2015    Chief Complaint  Patient presents with  . Acute Visit    Right shoulder pain    HPI:  She as several complaints. She is having some chronic pain issues; her pain is mainly focused in her abdomen; she feels like she is having bladder spasms. She is gaining weight and is interested in gastric bypass in order to help with weight loss. We did discuss her weight gain; she tells me that she is only eating the meals given to her; but there are sodas on her table. We have decided to talk with the dietician first to see what options there are before trying a drastic measure like gastric bypass.    Past Medical History  Diagnosis Date  . MS (multiple sclerosis) ([place])     a. Dx'd late 20's. b. Tx with Novantrone, Tysabri, Copaxone previously.  Marland Kitchen Hypertension   . Depression   . Glaucoma   . Cardiomyopathy ([place])     a.  Echo 04/29/12: Mild LVH, EF 20-25%, mild AI, moderate MR, moderate LAE, mild RAE, mild RVE, moderate TR, PASP 51, small pericardial effusion;   b. probably non-ischemic given multiple chemo-Tx agents used for MS and global LV dysfn on echo  . Chronic systolic heart failure  [facility])     Past Surgical History  Procedure Laterality Date  . Ablation      uterine  . Cesarean section      Social History   Social History  . Marital Status: Single    Spouse Name: N/A  . Number of Children: N/A  . Years of Education: N/A   Occupational History  . Not on file.   Social History Main Topics  . Smoking status: Former Research scientist (life sciences)  . Smokeless tobacco: Never Used  . Alcohol Use: No  . Drug Use: No  . Sexual Activity: Not Currently   Other Topics Concern  . Not on file   Social History Narrative   Family History  Problem Relation Age of Onset  . Hypertension Mother   . Heart attack Neg Hx   . Cancer Mother     breast   . Cancer Father     prostate  . Multiple sclerosis Sister       VITAL SIGNS BP 118/85 mmHg  Pulse 72  Temp(Src) 97.1 F (36.2 C) (Oral)  Resp 18  Ht '5\' 5"'$  (1.651 m)  Wt 233 lb 3 oz (105.773 kg)  BMI 38.80 kg/m2  SpO2 99%  Patient's Medications  New Prescriptions   No medications on file  Previous Medications   ACETAMINOPHEN (TYLENOL) 325 MG TABLET    Take 2 tablets (650 mg total) by mouth every 6 (six) hours as needed  for mild pain or fever (or Fever >/= 101).   ALBUTEROL (PROVENTIL) (2.5 MG/3ML) 0.083% NEBULIZER SOLUTION    Take 3 mLs (2.5 mg total) by nebulization every 2 (two) hours as needed for wheezing.   AMANTADINE (SYMMETREL) 100 MG CAPSULE    Take 100 mg by mouth daily.    BACLOFEN (LIORESAL) 20 MG TABLET    Take 1 tablet (20 mg total) by mouth 3 (three) times daily as needed for muscle spasms.   CARVEDILOL (COREG) 25 MG TABLET    Take 1 tablet (25 mg total) by mouth 2 (two) times daily with a meal.   CHOLECALCIFEROL (VITAMIN D-3 PO)    Take 1,000 Int'l Units/day by mouth daily.    CRANBERRY PO    Take 100 mg by mouth daily.   CYANOCOBALAMIN (VITAMIN B-12 PO)    Take 1 tablet by mouth daily.   DULOXETINE (CYMBALTA) 60 MG CAPSULE    Take 60 mg by mouth daily.   FAMOTIDINE (PEPCID) 20 MG TABLET    Take 1 tablet (20 mg  total) by mouth 2 (two) times daily.   FUROSEMIDE (LASIX) 40 MG TABLET    Take 1 tablet (40 mg total) by mouth daily.   GABAPENTIN (NEURONTIN) 100 MG CAPSULE    Take 7 capsules (700 mg total) by mouth 2 (two) times daily.   LACTASE (LACTAID PO)    Take 1 tablet by mouth daily.   LATANOPROST (XALATAN) 0.005 % OPHTHALMIC SOLUTION    Place 1 drop into both eyes at bedtime.   MAGNESIUM HYDROXIDE (MILK OF MAGNESIA) 400 MG/5ML SUSPENSION    Take 30 mLs by mouth every 8 (eight) hours as needed for mild constipation or moderate constipation.   MIRABEGRON ER (MYRBETRIQ) 25 MG TB24 TABLET    Take 25 mg by mouth at bedtime.   ONDANSETRON (ZOFRAN) 4 MG TABLET    Take 1 tablet (4 mg total) by mouth every 6 (six) hours as needed for nausea.   POLYETHYLENE GLYCOL (MIRALAX / GLYCOLAX) PACKET    Take 17 g by mouth daily as needed for mild constipation.   POTASSIUM CHLORIDE (KLOR-CON) 20 MEQ PACKET    Take 20 mEq by mouth daily.    PROPYLENE GLYCOL (SYSTANE BALANCE) 0.6 % SOLN    Place 1 drop into both eyes 2 (two) times daily.   SENNA-DOCUSATE (SENOKOT S) 8.6-50 MG TABLET    Take 1 tablet by mouth 2 (two) times daily.   SPIRONOLACTONE (ALDACTONE) 25 MG TABLET    Take 25 mg by mouth daily. For CHF   TAMSULOSIN (FLOMAX) 0.4 MG CAPS CAPSULE    Take 0.4 mg by mouth daily. For incontinence   ZOLPIDEM (AMBIEN) 5 MG TABLET    Take 5 mg by mouth at bedtime.  Modified Medications   No medications on file  Discontinued Medications   No medications on file     SIGNIFICANT DIAGNOSTIC EXAMS  10-08-14: MRI brain: No significant change in diffuse white matter lesions consistent with patient's history of multiple sclerosis. None of these areas demonstrate abnormal restriction or enhancement as may be seen with areas of acute demyelination. Global atrophy. 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 Right parotid 1 cm lesion does not have typical appearance of an  intra parotid lymph node and therefore primary parotid lesion is a possibility. ENT consultation may be considered for further delineation.  10-08-14: mri of cervical; spine:  Scattered new areas of altered signal intensity (compared to 2007) throughout the cervical cord as detailed above consistent with patient's history of multiple sclerosis. None of these areas demonstrate enhancement as may be seen with areas of active demyelination. Cervical spondylotic changes as detailed above most prominent C5-6 level.  12-13-14: ABI: right: mild atherosclerotic disease   01-12-15: 2-d echo: Left ventricle: The cavity size was normal. There was mild concentric hypertrophy. Systolic function was normal. The estimated ejection fraction was in the range of 60% to 65%. Wall motion was normal; there were no regional wall motion abnormalities. Doppler parameters are consistent with abnormal left ventricular relaxation (grade 1 diastolic dysfunction). - Aortic valve: There was mild regurgitation. - Mitral valve: Calcified annulus. There was mild regurgitation.  05-30-15: chest x-ray: No active disease.  07-14-15: EEG: This EEG is consistent with a mild generalized non-specific cerebral dysfunction(encephalopathy).   07-20-15: chest x-ray; Interval extubation. Decreasing lung volumes with increasing bibasilar opacities, likely atelectasis.  11-07-15: ct of head; No acute intracranial abnormality. White matter changes are suggestive for chronic small vessel ischemic disease. Right maxillary sinus disease.     LABS REVIEWED:   01-10-15: wbc 6.2; hgb 11.3; hct 34.0; mcv 84.2; plt 291; glucose 101; bun 21; creat 0.89; k+4.5; na++140; alk phos 138; albumin 3.4; ;urine culture: enterococcus 01-11-15: wbc 6.3; hgb 10.2; hct 31.2; mcv 83.9; ;plt 256; glucose 112; bun 17; creat 0.79; k+3.9; na++145; liver normal albumin 3.1; hgb a1c 5.3; tsh 1.209 01-12-15: HIV: nr; RPR: nr; ammonia 30; iron 50; tibc 253; folate 24.2; vit b  12: 2487  04-18-15: enterococcus faecalis: macrobid  05-30-15: urine culture: multiple bacteria  06-02-15: wbc 12.2; hgb 10.4; hct 32.0; mcv 84.7; plt 257; glucose 119; bun 18; creat 0.99; k+ 3.5; na++150; liver normal albumin 3.3  07-10-15: tsh 0.557; vit B12: 2449 07-23-15: wbc 7.9; hgb 8.7; hct 25.5; mcv 79.4; plt 304; glucose 135; bun 7; creat 0.67; k+ 4.9; na++142; mag 2.2  08-19-15: wbc 9.9; hgb 10.6; hct 33.2; mcv 81.8; plt 400 glucose 105; bun 11.0; creat 1.13; k+ 4.1; na++141; liver normal albumin 3.7  urine culture: proteus mirabilis: cipro  11-16-15: wbc 8.2; hgb 9.4; hct 30.9; mcv 82.5; plt 249 glucose 89; bun 15.0; creat 0.75; k+ 3.9; na++ 141; liver normal albumin 3.5     Review of Systems  Constitutional: Negative for malaise/fatigue. has weight gain  Respiratory: Negative for cough and shortness of breath.   Cardiovascular: Negative for chest pain, palpitations and leg swelling.  Gastrointestinal: Negative for heartburn constipation. has left side abdominal pain  Musculoskeletal: Negative for myalgias, back pain and joint pain.  Skin: Negative.   Neurological: Negative for dizziness.  Psychiatric/Behavioral: The patient is not nervous/anxious.      Physical Exam Constitutional: . No distress.  Obese Neck: Neck supple. No JVD present. No thyromegaly present.  Cardiovascular: Normal rate, regular rhythm and intact distal pulses.   Respiratory: Effort normal and breath sounds normal. No respiratory distress.  GI: Soft. Bowel sounds are normal. She exhibits no distension. There is no tenderness.  Musculoskeletal: She exhibits no edema.  Is able to move right  upper extremity Limited movement in her lower extremities  and left upper extremity  Neurological: alert and oriented   Skin: Skin is warm and dry. She is not diaphoretic.       ASSESSMENT/ PLAN:  1. Overactive bladder 2. UTI 3. Weight gain  Will increase myrbetric to 50 mg daily  Will get UA/C&S Will have  dietician  come talk with her regarding weight loss   Time spent with patient  30  minutes >50% time spent counseling; reviewing medical record; tests; labs; and developing future plan of care     Ok Edwards NP [facility]  Contact [phone] Monday through Friday 8am- 5pm  After hours call [phone]

## 2016-01-12 DIAGNOSIS — R319 Hematuria, unspecified: Secondary | ICD-10-CM | POA: Diagnosis not present

## 2016-01-12 DIAGNOSIS — G35 Multiple sclerosis: Secondary | ICD-10-CM | POA: Diagnosis not present

## 2016-01-12 DIAGNOSIS — R1311 Dysphagia, oral phase: Secondary | ICD-10-CM | POA: Diagnosis not present

## 2016-01-12 DIAGNOSIS — N390 Urinary tract infection, site not specified: Secondary | ICD-10-CM | POA: Diagnosis not present

## 2016-01-13 DIAGNOSIS — R1311 Dysphagia, oral phase: Secondary | ICD-10-CM | POA: Diagnosis not present

## 2016-01-13 DIAGNOSIS — G35 Multiple sclerosis: Secondary | ICD-10-CM | POA: Diagnosis not present

## 2016-01-16 DIAGNOSIS — R1311 Dysphagia, oral phase: Secondary | ICD-10-CM | POA: Diagnosis not present

## 2016-01-16 DIAGNOSIS — G35 Multiple sclerosis: Secondary | ICD-10-CM | POA: Diagnosis not present

## 2016-01-17 DIAGNOSIS — G35 Multiple sclerosis: Secondary | ICD-10-CM | POA: Diagnosis not present

## 2016-01-17 DIAGNOSIS — R1311 Dysphagia, oral phase: Secondary | ICD-10-CM | POA: Diagnosis not present

## 2016-01-18 ENCOUNTER — Encounter: Payer: Self-pay | Admitting: Adult Health

## 2016-01-18 ENCOUNTER — Non-Acute Institutional Stay (SKILLED_NURSING_FACILITY): Payer: Medicare Other | Admitting: Adult Health

## 2016-01-18 DIAGNOSIS — R1311 Dysphagia, oral phase: Secondary | ICD-10-CM | POA: Diagnosis not present

## 2016-01-18 DIAGNOSIS — M25511 Pain in right shoulder: Secondary | ICD-10-CM | POA: Diagnosis not present

## 2016-01-18 DIAGNOSIS — M25512 Pain in left shoulder: Secondary | ICD-10-CM | POA: Diagnosis not present

## 2016-01-18 DIAGNOSIS — G35 Multiple sclerosis: Secondary | ICD-10-CM | POA: Diagnosis not present

## 2016-01-18 NOTE — Progress Notes (Signed)
Patient ID: Shannon Garrett, female   DOB: 14-Jun-1961, 55 y.o.   MRN: [id number]   Location:   [place] Room Number: 230-B Place of Service:  SNF (31)   CODE STATUS: DNR  Allergies  Allergen Reactions  . Sulfonamide Derivatives Hives and Shortness Of Breath  . Penicillins Hives and Itching    Has patient had a PCN reaction causing immediate rash, facial/tongue/throat swelling, SOB or lightheadedness with hypotension: unknown Has patient had a PCN reaction causing severe rash involving mucus membranes or skin necrosis: unknown Has patient had a PCN reaction that required hospitalization: unknown Has patient had a PCN reaction occurring within the last 10 years: unknown If all of the above answers are "NO", then may proceed with Cephalosporin use. Prior course of rocephin charted 05/2015    Chief Complaint  Patient presents with  . Acute Visit    Pain Management    HPI:  She is having pain management issues. She is complaining of bilateral shoulder pain which is not being adequately controlled. She does not want any narcotics; but is willing to try other medications. We did discuss the use of lidoderm patch on both her shoulders.    Past Medical History  Diagnosis Date  . MS (multiple sclerosis) ([place])     a. Dx'd late 20's. b. Tx with Novantrone, Tysabri, Copaxone previously.  Marland Kitchen Hypertension   . Depression   . Glaucoma   . Cardiomyopathy ([place])     a.  Echo 04/29/12: Mild LVH, EF 20-25%, mild AI, moderate MR, moderate LAE, mild RAE, mild RVE, moderate TR, PASP 51, small pericardial effusion;   b. probably non-ischemic given multiple chemo-Tx agents used for MS and global LV dysfn on echo  . Chronic systolic heart failure [facility])     Past Surgical History  Procedure Laterality Date  . Ablation      uterine  . Cesarean section      Social History   Social History  . Marital Status: Single    Spouse Name: N/A  . Number of Children: N/A  . Years of  Education: N/A   Occupational History  . Not on file.   Social History Main Topics  . Smoking status: Former Research scientist (life sciences)  . Smokeless tobacco: Never Used  . Alcohol Use: No  . Drug Use: No  . Sexual Activity: Not Currently   Other Topics Concern  . Not on file   Social History Narrative   Family History  Problem Relation Age of Onset  . Hypertension Mother   . Heart attack Neg Hx   . Cancer Mother     breast   . Cancer Father     prostate  . Multiple sclerosis Sister       VITAL SIGNS BP 123/73 mmHg  Pulse 74  Temp(Src) 97.8 F (36.6 C) (Oral)  Resp 18  Ht 5\' 5"  (1.651 m)  Wt 233 lb 3 oz (105.773 kg)  BMI 38.80 kg/m2  SpO2 99%  Patient's Medications  New Prescriptions   No medications on file  Previous Medications   ACETAMINOPHEN (TYLENOL) 325 MG TABLET    Take 2 tablets (650 mg total) by mouth every 6 (six) hours as needed for mild pain or fever (or Fever >/= 101).   ALBUTEROL (PROVENTIL) (2.5 MG/3ML) 0.083% NEBULIZER SOLUTION    Take 3 mLs (2.5 mg total) by nebulization every 2 (two) hours as needed for wheezing.   AMANTADINE (SYMMETREL) 100 MG CAPSULE    Take 100 mg  by mouth daily.    BACLOFEN (LIORESAL) 20 MG TABLET    Take 1 tablet (20 mg total) by mouth 3 (three) times daily as needed for muscle spasms.   CARVEDILOL (COREG) 25 MG TABLET    Take 1 tablet (25 mg total) by mouth 2 (two) times daily with a meal.   CHOLECALCIFEROL (VITAMIN D-3 PO)    Take 1,000 Int'l Units/day by mouth daily.    CRANBERRY PO    Take 100 mg by mouth daily.   CYANOCOBALAMIN (VITAMIN B-12 PO)    Take 1 tablet by mouth daily.   DULOXETINE (CYMBALTA) 60 MG CAPSULE    Take 60 mg by mouth daily.   FAMOTIDINE (PEPCID) 20 MG TABLET    Take 1 tablet (20 mg total) by mouth 2 (two) times daily.   FUROSEMIDE (LASIX) 40 MG TABLET    Take 1 tablet (40 mg total) by mouth daily.   GABAPENTIN (NEURONTIN) 100 MG CAPSULE    Take 7 capsules (700 mg total) by mouth 2 (two) times daily.   LACTASE  (LACTAID PO)    Take 1 tablet by mouth daily.   LATANOPROST (XALATAN) 0.005 % OPHTHALMIC SOLUTION    Place 1 drop into both eyes at bedtime.   MAGNESIUM HYDROXIDE (MILK OF MAGNESIA) 400 MG/5ML SUSPENSION    Take 30 mLs by mouth every 8 (eight) hours as needed for mild constipation or moderate constipation.   MIRABEGRON ER (MYRBETRIQ) 25 MG TB24 TABLET    Take 25 mg by mouth at bedtime.   ONDANSETRON (ZOFRAN) 4 MG TABLET    Take 1 tablet (4 mg total) by mouth every 6 (six) hours as needed for nausea.   POLYETHYLENE GLYCOL (MIRALAX / GLYCOLAX) PACKET    Take 17 g by mouth daily as needed for mild constipation.   POTASSIUM CHLORIDE (KLOR-CON) 20 MEQ PACKET    Take 20 mEq by mouth daily.    PROPYLENE GLYCOL (SYSTANE BALANCE) 0.6 % SOLN    Place 1 drop into both eyes 2 (two) times daily.   SENNA-DOCUSATE (SENOKOT S) 8.6-50 MG TABLET    Take 1 tablet by mouth 2 (two) times daily.   SPIRONOLACTONE (ALDACTONE) 25 MG TABLET    Take 25 mg by mouth daily. For CHF   TAMSULOSIN (FLOMAX) 0.4 MG CAPS CAPSULE    Take 0.4 mg by mouth daily. For incontinence   ZOLPIDEM (AMBIEN) 5 MG TABLET    Take 5 mg by mouth at bedtime.  Modified Medications   No medications on file  Discontinued Medications   No medications on file     SIGNIFICANT DIAGNOSTIC EXAMS  10-08-14: MRI brain: No significant change in diffuse white matter lesions consistent with patient's history of multiple sclerosis. None of these areas demonstrate abnormal restriction or enhancement as may be seen with areas of acute demyelination. Global atrophy. 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 Right parotid 1 cm lesion does not have typical appearance of an intra parotid lymph node and therefore primary parotid lesion is a possibility. ENT consultation may be considered for further delineation.  10-08-14: mri of cervical; spine: Scattered new areas of altered signal intensity (compared to  2007) throughout the cervical cord as detailed above consistent with patient's history of multiple sclerosis. None of these areas demonstrate enhancement as may be seen with areas of active demyelination. Cervical spondylotic changes as detailed above most prominent C5-6 level.  12-13-14: ABI: right: mild atherosclerotic disease   01-10-15: ct of head: No acute intracranial abnormalities. Mild atrophy. White matter disease compatible with history of MS.  01-10-15: chest x-ray: No active disease  01-12-15: 2-d echo: Left ventricle: The cavity size was normal. There was mild concentric hypertrophy. Systolic function was normal. The estimated ejection fraction was in the range of 60% to 65%. Wall motion was normal; there were no regional wall motion abnormalities. Doppler parameters are consistent with abnormal left ventricular relaxation (grade 1 diastolic dysfunction). - Aortic valve: There was mild regurgitation. - Mitral valve: Calcified annulus. There was mild regurgitation.  05-30-15: chest x-ray: No active disease.  07-14-15: EEG: This EEG is consistent with a mild generalized non-specific cerebral dysfunction(encephalopathy).   07-20-15: chest x-ray; Interval extubation. Decreasing lung volumes with increasing bibasilar opacities, likely atelectasis.  11-07-15: ct of head; No acute intracranial abnormality. White matter changes are suggestive for chronic small vessel ischemic disease. Right maxillary sinus disease.     LABS REVIEWED:   04-18-15: enterococcus faecalis: macrobid  05-30-15: urine culture: multiple bacteria  06-02-15: wbc 12.2; hgb 10.4; hct 32.0; mcv 84.7; plt 257; glucose 119; bun 18; creat 0.99; k+ 3.5; na++150; liver normal albumin 3.3  07-10-15: tsh 0.557; vit B12: 2449 07-23-15: wbc 7.9; hgb 8.7; hct 25.5; mcv 79.4; plt 304; glucose 135; bun 7; creat 0.67; k+ 4.9; na++142; mag 2.2  08-19-15: wbc 9.9; hgb 10.6; hct 33.2; mcv 81.8; plt 400 glucose 105; bun 11.0; creat 1.13;  k+ 4.1; na++141; liver normal albumin 3.7  urine culture: proteus mirabilis: cipro  11-16-15: wbc 8.2; hgb 9.4; hct 30.9; mcv 82.5; plt 249 glucose 89; bun 15.0; creat 0.75; k+ 3.9; na++ 141; liver normal albumin 3.5  01-12-16: urine culture: no growth    Review of Systems  Constitutional: Negative for malaise/fatigue.  Respiratory: Negative for cough and shortness of breath.   Cardiovascular: Negative for chest pain, palpitations and leg swelling.  Gastrointestinal: Negative for heartburn, abdominal pain and constipation.  Musculoskeletal: Negative for back pain has bilateral shoulder pain  Skin: Negative.   Neurological: Negative for dizziness.  Psychiatric/Behavioral: The patient is not nervous/anxious.      Physical Exam Constitutional: . No distress.  Overweight   Neck: Neck supple. No JVD present. No thyromegaly present.  Cardiovascular: Normal rate, regular rhythm and intact distal pulses.   Respiratory: Effort normal and breath sounds normal. No respiratory distress.  GI: Soft. Bowel sounds are normal. She exhibits no distension. There is no tenderness.  Musculoskeletal: She exhibits no edema.  Is able to move right  upper extremity No movement in her lower extremities  and left upper extremity  Neurological: lethargic  .  Skin: Skin is warm and dry. She is not diaphoretic.       ASSESSMENT/ PLAN:  1. Chronic pain:   will continue  neurontin 700 mg twice  daily; takes cymbalta 60 mg daily  Her bilateral shoulder pain is getting worse; will begin 1/2 lidocaine patch to both shoulders on in the AM and off in the PM  Will monitor     Ok Edwards NP [facility]  Contact [phone] Monday through Friday 8am- 5pm  After hours call [phone]

## 2016-01-19 NOTE — Progress Notes (Signed)
Order(s) created erroneously. Erroneous order ID: [id number]  Order moved by: Alfonso Patten  Order move date/time: 01/19/2016 2:01 PM  Source Patient: [id number]  Source Contact: 01/18/2016  Destination Patient: [id number]  Destination Contact: 09/16/2012  Erroneous order ID: [id number]  Order moved by: Alfonso Patten  Order move date/time: 01/19/2016 2:01 PM  Source Patient: [id number]  Source Contact: 01/18/2016  Destination Patient: [id number]  Destination Contact: 09/16/2012  Erroneous order ID: [id number]  Order moved by: Alfonso Patten  Order move date/time: 01/19/2016 2:01 PM  Source Patient: [id number]  Source Contact: 01/18/2016  Destination Patient: [id number]  Destination Contact: 09/16/2012

## 2016-01-25 ENCOUNTER — Non-Acute Institutional Stay (SKILLED_NURSING_FACILITY): Payer: Medicare Other | Admitting: Adult Health

## 2016-01-25 ENCOUNTER — Encounter: Payer: Self-pay | Admitting: Neurology

## 2016-01-25 ENCOUNTER — Encounter: Payer: Self-pay | Admitting: Adult Health

## 2016-01-25 ENCOUNTER — Encounter: Payer: Self-pay | Admitting: *Deleted

## 2016-01-25 ENCOUNTER — Ambulatory Visit (INDEPENDENT_AMBULATORY_CARE_PROVIDER_SITE_OTHER): Payer: Medicare Other | Admitting: Neurology

## 2016-01-25 VITALS — BP 126/64 | Resp 20 | Wt 230.0 lb

## 2016-01-25 DIAGNOSIS — I110 Hypertensive heart disease with heart failure: Secondary | ICD-10-CM | POA: Diagnosis not present

## 2016-01-25 DIAGNOSIS — I5042 Chronic combined systolic (congestive) and diastolic (congestive) heart failure: Secondary | ICD-10-CM

## 2016-01-25 DIAGNOSIS — N319 Neuromuscular dysfunction of bladder, unspecified: Secondary | ICD-10-CM

## 2016-01-25 DIAGNOSIS — Z79899 Other long term (current) drug therapy: Secondary | ICD-10-CM

## 2016-01-25 DIAGNOSIS — G8220 Paraplegia, unspecified: Secondary | ICD-10-CM

## 2016-01-25 DIAGNOSIS — G894 Chronic pain syndrome: Secondary | ICD-10-CM | POA: Diagnosis not present

## 2016-01-25 DIAGNOSIS — K219 Gastro-esophageal reflux disease without esophagitis: Secondary | ICD-10-CM

## 2016-01-25 DIAGNOSIS — F329 Major depressive disorder, single episode, unspecified: Secondary | ICD-10-CM | POA: Diagnosis not present

## 2016-01-25 DIAGNOSIS — E559 Vitamin D deficiency, unspecified: Secondary | ICD-10-CM

## 2016-01-25 DIAGNOSIS — R5383 Other fatigue: Secondary | ICD-10-CM | POA: Diagnosis not present

## 2016-01-25 DIAGNOSIS — K5900 Constipation, unspecified: Secondary | ICD-10-CM

## 2016-01-25 DIAGNOSIS — F32A Depression, unspecified: Secondary | ICD-10-CM

## 2016-01-25 DIAGNOSIS — G35D Multiple sclerosis, unspecified: Secondary | ICD-10-CM

## 2016-01-25 DIAGNOSIS — G35 Multiple sclerosis: Secondary | ICD-10-CM

## 2016-01-25 DIAGNOSIS — K5909 Other constipation: Secondary | ICD-10-CM

## 2016-01-25 MED ORDER — PHENTERMINE HCL 37.5 MG PO CAPS: 37.5000 mg | ORAL_CAPSULE | ORAL | 5 refills | Status: DC

## 2016-01-25 NOTE — Progress Notes (Signed)
Patient ID: Shannon Garrett, female   DOB: Aug 23, 1960, 55 y.o.   MRN: [id number]   Location:    [facility] Room Number: 230-B Place of Service:  SNF (31)   CODE STATUS: DNR  Allergies  Allergen Reactions  . Sulfonamide Derivatives Hives and Shortness Of Breath  . Penicillins Hives and Itching    Has patient had a PCN reaction causing immediate rash, facial/tongue/throat swelling, SOB or lightheadedness with hypotension: unknown Has patient had a PCN reaction causing severe rash involving mucus membranes or skin necrosis: unknown Has patient had a PCN reaction that required hospitalization: unknown Has patient had a PCN reaction occurring within the last 10 years: unknown If all of the above answers are "NO", then may proceed with Cephalosporin use. Prior course of rocephin charted 05/2015    Chief Complaint  Patient presents with  . Medical Management of Chronic Issues    Follow up    HPI:  She is a long term resident of this facility being seen for the management of her chronic illnesses. Overall her status is stable. She tells me that her pain is being adequately managed at this time. There are no nursing concerns at this time. She is wanting to lose weight; she is awaiting meeting with dietary for weight loss.   Past Medical History:  Diagnosis Date  . Cardiomyopathy ([place])    a.  Echo 04/29/12: Mild LVH, EF 20-25%, mild AI, moderate MR, moderate LAE, mild RAE, mild RVE, moderate TR, PASP 51, small pericardial effusion;   b. probably non-ischemic given multiple chemo-Tx agents used for MS and global LV dysfn on echo  . Chronic systolic heart failure ([place])   . Depression   . Glaucoma   . Hypertension   . MS (multiple sclerosis) ([place])    a. Dx'd late 20's. b. Tx with Novantrone, Tysabri, Copaxone previously.    Past Surgical History:  Procedure Laterality Date  . ABLATION     uterine  . CESAREAN SECTION      Social History   Social History  . Marital  status: Single    Spouse name: N/A  . Number of children: N/A  . Years of education: N/A   Occupational History  . Not on file.   Social History Main Topics  . Smoking status: Former Research scientist (life sciences)  . Smokeless tobacco: Never Used  . Alcohol use No  . Drug use: No  . Sexual activity: Not Currently   Other Topics Concern  . Not on file   Social History Narrative  . No narrative on file   Family History  Problem Relation Age of Onset  . Hypertension Mother   . Cancer Mother     breast   . Cancer Father     prostate  . Multiple sclerosis Sister   . Heart attack Neg Hx       VITAL SIGNS BP 104/67   Pulse 77   Temp 98.4 F (36.9 C) (Oral)   Resp 16   Ht '5\' 5"'$  (1.651 m)   Wt 233 lb 3 oz (105.8 kg)   SpO2 98%   BMI 38.80 kg/m   Patient's Medications  New Prescriptions   No medications on file  Previous Medications   ACETAMINOPHEN (TYLENOL) 325 MG TABLET    Take 2 tablets (650 mg total) by mouth every 6 (six) hours as needed for mild pain or fever (or Fever >/= 101).   ALBUTEROL (PROVENTIL) (2.5 MG/3ML) 0.083% NEBULIZER SOLUTION    Take  3 mLs (2.5 mg total) by nebulization every 2 (two) hours as needed for wheezing.   AMANTADINE (SYMMETREL) 100 MG CAPSULE    Take 100 mg by mouth daily.    BACLOFEN (LIORESAL) 20 MG TABLET    Take 1 tablet (20 mg total) by mouth 3 (three) times daily as needed for muscle spasms.   CARVEDILOL (COREG) 25 MG TABLET    Take 1 tablet (25 mg total) by mouth 2 (two) times daily with a meal.   CHOLECALCIFEROL (VITAMIN D-3 PO)    Take 1,000 Int'l Units/day by mouth daily.    CRANBERRY PO    Take 100 mg by mouth daily.   CYANOCOBALAMIN (VITAMIN B-12 PO)    Take 1 tablet by mouth daily.   DULOXETINE (CYMBALTA) 60 MG CAPSULE    Take 60 mg by mouth daily.   FAMOTIDINE (PEPCID) 20 MG TABLET    Take 1 tablet (20 mg total) by mouth 2 (two) times daily.   FUROSEMIDE (LASIX) 40 MG TABLET    Take 1 tablet (40 mg total) by mouth daily.   GABAPENTIN (NEURONTIN)  100 MG CAPSULE    Take 7 capsules (700 mg total) by mouth 2 (two) times daily.   LACTASE (LACTAID PO)    Take 1 tablet by mouth daily.   LATANOPROST (XALATAN) 0.005 % OPHTHALMIC SOLUTION    Place 1 drop into both eyes at bedtime.   MAGNESIUM HYDROXIDE (MILK OF MAGNESIA) 400 MG/5ML SUSPENSION    Take 30 mLs by mouth every 8 (eight) hours as needed for mild constipation or moderate constipation.   MIRABEGRON ER (MYRBETRIQ) 25 MG TB24 TABLET    Take 25 mg by mouth at bedtime.   ONDANSETRON (ZOFRAN) 4 MG TABLET    Take 1 tablet (4 mg total) by mouth every 6 (six) hours as needed for nausea.   POLYETHYLENE GLYCOL (MIRALAX / GLYCOLAX) PACKET    Take 17 g by mouth daily as needed for mild constipation.   POTASSIUM CHLORIDE (KLOR-CON) 20 MEQ PACKET    Take 20 mEq by mouth daily.    PROPYLENE GLYCOL (SYSTANE BALANCE) 0.6 % SOLN    Place 1 drop into both eyes 2 (two) times daily.   SENNA-DOCUSATE (SENOKOT S) 8.6-50 MG TABLET    Take 1 tablet by mouth 2 (two) times daily.   SPIRONOLACTONE (ALDACTONE) 25 MG TABLET    Take 25 mg by mouth daily. For CHF   TAMSULOSIN (FLOMAX) 0.4 MG CAPS CAPSULE    Take 0.4 mg by mouth daily. For incontinence   ZOLPIDEM (AMBIEN) 5 MG TABLET    Take 5 mg by mouth at bedtime.  Modified Medications   No medications on file  Discontinued Medications   No medications on file     SIGNIFICANT DIAGNOSTIC EXAMS   10-08-14: MRI brain: No significant change in diffuse white matter lesions consistent with patient's history of multiple sclerosis. None of these areas demonstrate abnormal restriction or enhancement as may be seen with areas of acute demyelination. Global atrophy. 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 Right parotid 1 cm lesion does not have typical appearance of an intra parotid lymph node and therefore primary parotid lesion is a possibility. ENT consultation may be considered for  further delineation.  10-08-14: mri of cervical; spine: Scattered new areas of altered signal intensity (compared to 2007) throughout the cervical cord as detailed above consistent with patient's history  of multiple sclerosis. None of these areas demonstrate enhancement as may be seen with areas of active demyelination. Cervical spondylotic changes as detailed above most prominent C5-6 level.  12-13-14: ABI: right: mild atherosclerotic disease   01-10-15: ct of head: No acute intracranial abnormalities. Mild atrophy. White matter disease compatible with history of MS.  01-10-15: chest x-ray: No active disease  01-12-15: 2-d echo: Left ventricle: The cavity size was normal. There was mild concentric hypertrophy. Systolic function was normal. The estimated ejection fraction was in the range of 60% to 65%. Wall motion was normal; there were no regional wall motion abnormalities. Doppler parameters are consistent with abnormal left ventricular relaxation (grade 1 diastolic dysfunction). - Aortic valve: There was mild regurgitation. - Mitral valve: Calcified annulus. There was mild regurgitation.  05-30-15: chest x-ray: No active disease.  07-14-15: EEG: This EEG is consistent with a mild generalized non-specific cerebral dysfunction(encephalopathy).   07-20-15: chest x-ray; Interval extubation. Decreasing lung volumes with increasing bibasilar opacities, likely atelectasis.  11-07-15: ct of head; No acute intracranial abnormality. White matter changes are suggestive for chronic small vessel ischemic disease. Right maxillary sinus disease.     LABS REVIEWED:   01-10-15: wbc 6.2; hgb 11.3; hct 34.0; mcv 84.2; plt 291; glucose 101; bun 21; creat 0.89; k+4.5; na++140; alk phos 138; albumin 3.4; ;urine culture: enterococcus 01-11-15: wbc 6.3; hgb 10.2; hct 31.2; mcv 83.9; ;plt 256; glucose 112; bun 17; creat 0.79; k+3.9; na++145; liver normal albumin 3.1; hgb a1c 5.3; tsh 1.209 01-12-15: HIV: nr; RPR: nr;  ammonia 30; iron 50; tibc 253; folate 24.2; vit b 12: 2487  04-18-15: enterococcus faecalis: macrobid  05-30-15: urine culture: multiple bacteria  06-02-15: wbc 12.2; hgb 10.4; hct 32.0; mcv 84.7; plt 257; glucose 119; bun 18; creat 0.99; k+ 3.5; na++150; liver normal albumin 3.3  07-10-15: tsh 0.557; vit B12: 2449 07-23-15: wbc 7.9; hgb 8.7; hct 25.5; mcv 79.4; plt 304; glucose 135; bun 7; creat 0.67; k+ 4.9; na++142; mag 2.2  08-19-15: wbc 9.9; hgb 10.6; hct 33.2; mcv 81.8; plt 400 glucose 105; bun 11.0; creat 1.13; k+ 4.1; na++141; liver normal albumin 3.7  urine culture: proteus mirabilis: cipro  11-16-15: wbc 8.2; hgb 9.4; hct 30.9; mcv 82.5; plt 249 glucose 89; bun 15.0; creat 0.75; k+ 3.9; na++ 141; liver normal albumin 3.5     Review of Systems  Constitutional: Negative for malaise/fatigue.  Respiratory: Negative for cough and shortness of breath.   Cardiovascular: Negative for chest pain, palpitations and leg swelling.  Gastrointestinal: Negative for heartburn, abdominal pain and constipation.  Musculoskeletal: Negative for myalgias, back pain and joint pain.  Skin: Negative.   Neurological: Negative for dizziness.  Psychiatric/Behavioral: The patient is not nervous/anxious.      Physical Exam Constitutional: . No distress.  Overweight   Neck: Neck supple. No JVD present. No thyromegaly present.  Cardiovascular: Normal rate, regular rhythm and intact distal pulses.   Respiratory: Effort normal and breath sounds normal. No respiratory distress.  GI: Soft. Bowel sounds are normal. She exhibits no distension. There is no tenderness.  Musculoskeletal: She exhibits no edema.  Is able to move right  upper extremity Limited movement in her lower extremities  and left upper extremity  Neurological: alert and oriented X 3   .  Skin: Skin is warm and dry. She is not diaphoretic.       ASSESSMENT/ PLAN: 1. MS: is slowly declining   Has been  followed by  neurology;  Has  paraplegia.will continue  amantadine 100 mg daily baclofen 20 mg tid prn  for muscle spasms;   She has not received her tysabri on 10-25-15; will have her see neurology for further treatment options.    2. Hypertension: will continue coreg 25 mg twice daily and will monitor   3. Chronic combined systolic and diastolic heart failure: is stable will continue lasix 40 mg daily; with k+ 20 meq daily; will continue aldactone 25 mg daily; her EF is 60-65%.   4. Neurogenic bladder: takes flomax 0.4 mg daily  myrbetriq 25 mg daily    5. Depression: is presently taking cymbalta 60 mg daily; which also helps with her pain management.  Is taking ambien 5 mg nightly for sleep   6. Chronic pain: she is presently stable   will continue  neurontin 700 mg twice  daily; takes cymbalta 60 mg daily    9. Constipation: will continue  miralax  daily as needed and senna s twice daily   10. Gerd: will continue pepcid 20 mg twice daily   11. Glaucoma: will continue xalatan to both eyes.               Ok Edwards NP [facility]  Contact [phone] Monday through Friday 8am- 5pm  After hours call [phone]

## 2016-01-25 NOTE — Progress Notes (Addendum)
[facility]  PATIENT: Shannon Garrett DOB: [birth year]  REFERRING DOCTOR OR PCP:  Gildardo Cranker SOURCE: Patient, records from EMR, MRI images on PACS.  _________________________________   HISTORICAL  CHIEF COMPLAINT:  Chief Complaint  Patient presents with  . Multiple Sclerosis    For various reasons, she  has only had 3 Tysabri infusions in the last 8-9 mos.  Last infusion was in April.  Before that, she had an infusion in Feb. 2017, and before that, infusion was in October 2016.  She lives at [facility] and transportation there has been unreliable.  She has also had mult. hospitalizations, so has been in the hospital when some infusions were due.  Needs to discuss other tx. options for MS/fim    HISTORY OF PRESENT ILLNESS:  Shannon Garrett is a 55 year old woman with multiple sclerosis.    She is feeling weaker in her left arm and legs are about the same.    She is on Tysabri and tolerates it well. However, she has missed some doses due to a hospitalization for UTI and NH not bringing her to last scheduled infusion.     Gait/strength/sensation: She is unable to walk and uses a wheelchair much of the day.    She has weakness in all extremities, worse on the left associated with spasticity that is also worse on the left.  She denies  numbness but occasionally will get some tingling in the left arm and leg.   She has spasticity, helped by baclofen a little bit.      Bladder:   She notes bladder spasms that are only partially helped by Myrbetriq. She has some retention and has some benefit from tamsulosin.  and has had more UTI's lately.   She has urinary incontinence at times and wears diapers at her current nursing home.  Vision: She denies any major problem with her vision.  Fatigue/sleep: She has physical and cognitive fatigue daily.   She denies excessive sleepiness.. She has difficulty falling asleep and staying asleep. The sleep onset insomnia is  worse than the sleep maintenance.  Ambien has helped her sleep.  Mood/cognition: She notes depression but no anxiety and she gets frustrated easily.  She has occasional crying spells.   She is on Cymbalta and tolerates it well.   She denies any major problems with cognition but feels she was able to think and process information better in the past.  Pain:  She reports pain in all 4 limbs and in her back. The pain is worse in the left arm. Percocet helped mildly.  MS history:     She was diagnosed with multiple sclerosis in the 1980s after presenting with optic neuritis, numbness in her legs and vertigo MRI was consistent with MS. Initially she was not placed on any medication as none were approved. She had multiple exacerbations and by the 1990s was having difficulty with her gait initially she was placed on Avonex and remain on that medication for many years.   She thinks I first saw her about 8 years ago.   She thinks she was switched to Tysabri but continued to have some progression. Therefore, Novantrone was tried.   She feels that Novantrone did slow her progression down significantly. However, this needed to be stopped after she developed congestive heart failure.  She switched her care to Dr. Jacqulynn Cadet and went back on Tysabri therapy but stopped seeing him when he moved to the [place] area about 1-1/2 years ago.  REVIEW OF SYSTEMS: Constitutional: No fevers, chills, sweats, or change in appetite.  Notes fatigue and insomnia Eyes: No visual changes, double vision, eye pain Ear, nose and throat: No hearing loss, ear pain, nasal congestion, sore throat Cardiovascular: No chest pain, palpitations Respiratory: No shortness of breath at rest or with exertion.   No wheezes GastrointestinaI: No nausea, vomiting, diarrhea, abdominal pain, fecal incontinence Genitourinary: as above Musculoskeletal: Some neck pain, back pain Integumentary: No rash, pruritus, skin lesions Neurological: as  above Psychiatric: as above Endocrine: No palpitations, diaphoresis, change in appetite, change in weigh or increased thirst Hematologic/Lymphatic: No anemia, purpura, petechiae. Allergic/Immunologic: No itchy/runny eyes, nasal congestion, recent allergic reactions, rashes  ALLERGIES: Allergies  Allergen Reactions  . Sulfonamide Derivatives Hives and Shortness Of Breath  . Penicillins Hives and Itching    Has patient had a PCN reaction causing immediate rash, facial/tongue/throat swelling, SOB or lightheadedness with hypotension: unknown Has patient had a PCN reaction causing severe rash involving mucus membranes or skin necrosis: unknown Has patient had a PCN reaction that required hospitalization: unknown Has patient had a PCN reaction occurring within the last 10 years: unknown If all of the above answers are "NO", then may proceed with Cephalosporin use. Prior course of rocephin charted 05/2015    HOME MEDICATIONS:  Current Outpatient Prescriptions:  .  acetaminophen (TYLENOL) 325 MG tablet, Take 2 tablets (650 mg total) by mouth every 6 (six) hours as needed for mild pain or fever (or Fever >/= 101)., Disp: 30 tablet, Rfl: 0 .  albuterol (PROVENTIL) (2.5 MG/3ML) 0.083% nebulizer solution, Take 3 mLs (2.5 mg total) by nebulization every 2 (two) hours as needed for wheezing., Disp: 75 mL, Rfl: 12 .  amantadine (SYMMETREL) 100 MG capsule, Take 100 mg by mouth daily. , Disp: , Rfl:  .  baclofen (LIORESAL) 20 MG tablet, Take 1 tablet (20 mg total) by mouth 3 (three) times daily as needed for muscle spasms., Disp: 30 each, Rfl: 0 .  carvedilol (COREG) 25 MG tablet, Take 1 tablet (25 mg total) by mouth 2 (two) times daily with a meal., Disp: , Rfl:  .  Cholecalciferol (VITAMIN D-3 PO), Take 1,000 Int'l Units/day by mouth daily. , Disp: , Rfl:  .  CRANBERRY PO, Take 100 mg by mouth daily., Disp: , Rfl:  .  Cyanocobalamin (VITAMIN B-12 PO), Take 1 tablet by mouth daily., Disp: , Rfl:  .   DULoxetine (CYMBALTA) 60 MG capsule, Take 60 mg by mouth daily., Disp: , Rfl:  .  famotidine (PEPCID) 20 MG tablet, Take 1 tablet (20 mg total) by mouth 2 (two) times daily., Disp: , Rfl:  .  furosemide (LASIX) 40 MG tablet, Take 1 tablet (40 mg total) by mouth daily., Disp: 30 tablet, Rfl: 0 .  gabapentin (NEURONTIN) 100 MG capsule, Take 7 capsules (700 mg total) by mouth 2 (two) times daily., Disp: 30 capsule, Rfl: 0 .  Lactase (LACTAID PO), Take 1 tablet by mouth daily., Disp: , Rfl:  .  latanoprost (XALATAN) 0.005 % ophthalmic solution, Place 1 drop into both eyes at bedtime., Disp: , Rfl:  .  magnesium hydroxide (MILK OF MAGNESIA) 400 MG/5ML suspension, Take 30 mLs by mouth every 8 (eight) hours as needed for mild constipation or moderate constipation., Disp: , Rfl:  .  mirabegron ER (MYRBETRIQ) 25 MG TB24 tablet, Take 25 mg by mouth at bedtime., Disp: , Rfl:  .  ondansetron (ZOFRAN) 4 MG tablet, Take 1 tablet (4 mg total) by mouth every  6 (six) hours as needed for nausea., Disp: 20 tablet, Rfl: 0 .  polyethylene glycol (MIRALAX / GLYCOLAX) packet, Take 17 g by mouth daily as needed for mild constipation., Disp: 120 packet, Rfl: 11 .  potassium chloride (KLOR-CON) 20 MEQ packet, Take 20 mEq by mouth daily. , Disp: , Rfl:  .  Propylene Glycol (SYSTANE BALANCE) 0.6 % SOLN, Place 1 drop into both eyes 2 (two) times daily., Disp: , Rfl:  .  senna-docusate (SENOKOT S) 8.6-50 MG tablet, Take 1 tablet by mouth 2 (two) times daily., Disp: , Rfl:  .  spironolactone (ALDACTONE) 25 MG tablet, Take 25 mg by mouth daily. For CHF, Disp: , Rfl:  .  tamsulosin (FLOMAX) 0.4 MG CAPS capsule, Take 0.4 mg by mouth daily. For incontinence, Disp: , Rfl:  .  zolpidem (AMBIEN) 5 MG tablet, Take 5 mg by mouth at bedtime., Disp: , Rfl:   PAST MEDICAL HISTORY: Past Medical History:  Diagnosis Date  . Cardiomyopathy ([place])    a.  Echo 04/29/12: Mild LVH, EF 20-25%, mild AI, moderate MR, moderate LAE, mild RAE, mild  RVE, moderate TR, PASP 51, small pericardial effusion;   b. probably non-ischemic given multiple chemo-Tx agents used for MS and global LV dysfn on echo  . Chronic systolic heart failure ([place])   . Depression   . Glaucoma   . Hypertension   . MS (multiple sclerosis) ([place])    a. Dx'd late 20's. b. Tx with Novantrone, Tysabri, Copaxone previously.    PAST SURGICAL HISTORY: Past Surgical History:  Procedure Laterality Date  . ABLATION     uterine  . CESAREAN SECTION      FAMILY HISTORY: Family History  Problem Relation Age of Onset  . Hypertension Mother   . Cancer Mother     breast   . Cancer Father     prostate  . Multiple sclerosis Sister   . Heart attack Neg Hx     SOCIAL HISTORY:  Social History   Social History  . Marital status: Single    Spouse name: N/A  . Number of children: N/A  . Years of education: N/A   Occupational History  . Not on file.   Social History Main Topics  . Smoking status: Former Research scientist (life sciences)  . Smokeless tobacco: Never Used  . Alcohol use No  . Drug use: No  . Sexual activity: Not Currently   Other Topics Concern  . Not on file   Social History Narrative  . No narrative on file     PHYSICAL EXAM  Vitals:   01/25/16 1537  BP: 126/64  Resp: 20  Weight: 230 lb (104.3 kg)    Body mass index is 38.27 kg/m.   General: The patient is well-developed and well-nourished and in no acute distress  Skin: Extremities show minimal ankle edema.   Neurologic Exam  Mental status: The patient is alert and oriented x 3 at the time of the examination. The patient has apparent normal recent and remote memory, with an apparently normal attention span and concentration ability.   Speech is normal.  Cranial nerves: Extraocular movements are full.  There is good facial sensation to soft touch bilaterally.Facial strength is normal.  Trapezius and sternocleidomastoid strength is normal. No dysarthria is noted.  The tongue is midline, and the  patient has symmetric elevation of the soft palate. No obvious hearing deficits are noted.  Motor:  Muscle bulk is normal.   Tone is increase in the legs greater  than the arms, left worse than right. Strength is 4+/5 in the right arm, 4 minus/5 in the left arm, 2/5 in the right leg and 2 minus/5 in the left leg..   Sensory: Sensory testing is intact to touch and vibration sensation in all 4 extremities.  Coordination: Cerebellar testing reveals good left and poor right finger-nose-finger bilaterally.  Gait and station: she is bedridden.   Reflexes: Deep tendon reflexes are increased in both legs with clonus.      DIAGNOSTIC DATA (LABS, IMAGING, TESTING) - I reviewed patient records, labs, notes, testing and imaging myself where available.  Lab Results  Component Value Date   WBC 8.2 11/16/2015   HGB 9.4 (A) 11/16/2015   HCT 31 (A) 11/16/2015   MCV 81.8 11/08/2015   PLT 249 11/16/2015      Component Value Date/Time   NA 141 11/16/2015   K 3.9 11/16/2015   CL 108 11/09/2015 0441   CO2 24 11/09/2015 0441   GLUCOSE 112 (H) 11/09/2015 0441   BUN 15 11/16/2015   CREATININE 0.8 11/16/2015   CREATININE 1.07 (H) 11/09/2015 0441   CALCIUM 9.1 11/09/2015 0441   PROT 7.8 11/08/2015 0042   ALBUMIN 4.0 11/08/2015 0042   AST 13 11/16/2015   ALT 13 11/16/2015   ALKPHOS 126 (A) 11/16/2015   BILITOT 0.4 11/08/2015 0042   GFRNONAA 59 (L) 11/09/2015 0441   GFRAA >60 11/09/2015 0441   Lab Results  Component Value Date   CHOL 145 05/01/2012   HDL 23 (L) 05/01/2012   LDLCALC 108 (H) 05/01/2012   TRIG 72 05/01/2012   CHOLHDL 6.3 05/01/2012   Lab Results  Component Value Date   HGBA1C 5.3 01/11/2015   Lab Results  Component Value Date   VITAMINB12 2,449 (H) 07/10/2015   Lab Results  Component Value Date   TSH 0.557 07/10/2015       ASSESSMENT AND PLAN  MULTIPLE SCLEROSIS, PROGRESSIVE/RELAPSING - Plan: CBC with Differential/Platelet, Hepatitis B surface antibody,  Hepatitis B core antibody, total, Quantiferon tb gold assay (blood), Comprehensive metabolic panel, Hepatic function panel  Paraplegia (HCC)  Neurogenic bladder  Chronic pain syndrome  Vitamin D deficiency  High risk medication use - Plan: CBC with Differential/Platelet, Hepatitis B surface antibody, Hepatitis B core antibody, total, Quantiferon tb gold assay (blood), Comprehensive metabolic panel, Hepatic function panel    1.   Due to issues with transportation, she might do better on Ocrelizumab than Tysabri (only had 3 doses in last 7 months) for her relapsing form of SP MS..   We discussed the risks and benefits. We'll go ahead and have her sign a service request form and we will check labs for hepatitis B and TB. 2.  Continue tamsulosin.  3.   She asked about weight loss surgery. Due to her impairments, I don't think she would be a good candidate but she can always discuss it with a bariatric Center. Trial of phentermine for weight.  May also help focus and fatigue. 4.   Continue other meds.     She will return in about 5-6 months or sooner if she has new or worsening neurologic symptoms.  45 minute face-to-face evaluation with greater than one half of the time counseling and coordinating care about her MS and treatment options.  Eitan Doubleday A. 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   Labs for hepatitis B and TB labs are normal or negative. She is cleared to get ocrelizumab therapy, FDA approved for relapsing form of MS.  Shannon Garrett A. Felecia Shelling, MD, PhD Certified in Neurology, Clinical Neurophysiology, Sleep Medicine, Pain Medicine and Neuroimaging

## 2016-01-26 LAB — COMPREHENSIVE METABOLIC PANEL
ALK PHOS: 138 IU/L — AB (ref 39–117)
ALT: 12 IU/L (ref 0–32)
AST: 12 IU/L (ref 0–40)
Albumin/Globulin Ratio: 1.3 (ref 1.2–2.2)
Albumin: 3.8 g/dL (ref 3.5–5.5)
BUN/Creatinine Ratio: 13 (ref 9–23)
BUN: 16 mg/dL (ref 6–24)
CHLORIDE: 103 mmol/L (ref 96–106)
CO2: 23 mmol/L (ref 18–29)
CREATININE: 1.2 mg/dL — AB (ref 0.57–1.00)
Calcium: 9 mg/dL (ref 8.7–10.2)
GFR calc Af Amer: 60 mL/min/{1.73_m2} (ref >59)
GFR calc non Af Amer: 52 mL/min/{1.73_m2} — ABNORMAL LOW (ref >59)
GLUCOSE: 113 mg/dL — AB (ref 65–99)
Globulin, Total: 3 g/dL (ref 1.5–4.5)
Potassium: 4.2 mmol/L (ref 3.5–5.2)
Sodium: 142 mmol/L (ref 134–144)
TOTAL PROTEIN: 6.8 g/dL (ref 6.0–8.5)

## 2016-01-26 LAB — CBC WITH DIFFERENTIAL/PLATELET
BASOS: 1 %
Basophils Absolute: 0.1 10*3/uL (ref 0.0–0.2)
EOS (ABSOLUTE): 0.3 10*3/uL (ref 0.0–0.4)
EOS: 6 %
HEMATOCRIT: 31.9 % — AB (ref 34.0–46.6)
HEMOGLOBIN: 10.3 g/dL — AB (ref 11.1–15.9)
Immature Grans (Abs): 0 10*3/uL (ref 0.0–0.1)
Immature Granulocytes: 0 %
LYMPHS ABS: 1.5 10*3/uL (ref 0.7–3.1)
Lymphs: 29 %
MCH: 26.4 pg — ABNORMAL LOW (ref 26.6–33.0)
MCHC: 32.3 g/dL (ref 31.5–35.7)
MCV: 82 fL (ref 79–97)
MONOCYTES: 8 %
MONOS ABS: 0.4 10*3/uL (ref 0.1–0.9)
NEUTROS ABS: 3 10*3/uL (ref 1.4–7.0)
Neutrophils: 56 %
Platelets: 281 10*3/uL (ref 150–379)
RBC: 3.9 x10E6/uL (ref 3.77–5.28)
RDW: 17.5 % — AB (ref 12.3–15.4)
WBC: 5.3 10*3/uL (ref 3.4–10.8)

## 2016-01-26 LAB — HEPATIC FUNCTION PANEL: Bilirubin, Direct: 0.06 mg/dL (ref 0.00–0.40)

## 2016-01-26 LAB — HEPATITIS B SURFACE ANTIBODY,QUALITATIVE: Hep B Surface Ab, Qual: NONREACTIVE

## 2016-01-26 LAB — HEPATITIS B CORE ANTIBODY, TOTAL: HEP B C TOTAL AB: NEGATIVE

## 2016-01-26 NOTE — Progress Notes (Signed)
Phentermine rx. that was printed today has been faxed to [facility] fax# [phone], with fax confirmation received/fim

## 2016-01-29 DIAGNOSIS — M25511 Pain in right shoulder: Secondary | ICD-10-CM | POA: Insufficient documentation

## 2016-01-29 DIAGNOSIS — M25512 Pain in left shoulder: Principal | ICD-10-CM

## 2016-01-29 LAB — QUANTIFERON IN TUBE
QFT TB AG MINUS NIL VALUE: 0 IU/mL
QUANTIFERON MITOGEN VALUE: >10 IU/mL
QUANTIFERON NIL VALUE: 0.03 [IU]/mL
QUANTIFERON TB AG VALUE: 0.03 IU/mL
QUANTIFERON TB GOLD: NEGATIVE

## 2016-01-29 LAB — QUANTIFERON TB GOLD ASSAY (BLOOD)

## 2016-02-01 NOTE — Telephone Encounter (Signed)
Shannon Garrett/[place] [phone] calling to to get new RX before med can be shipped. I read the msg from 12/29/15 to her. FYI Please call

## 2016-02-01 NOTE — Telephone Encounter (Signed)
Shannon Garrett is a buy & bill medication - it is not ordered through specialty pharmacy.  Shannon Garrett has been notified.

## 2016-02-03 DIAGNOSIS — G35 Multiple sclerosis: Secondary | ICD-10-CM | POA: Diagnosis not present

## 2016-02-03 DIAGNOSIS — I739 Peripheral vascular disease, unspecified: Secondary | ICD-10-CM | POA: Diagnosis not present

## 2016-02-03 DIAGNOSIS — B351 Tinea unguium: Secondary | ICD-10-CM | POA: Diagnosis not present

## 2016-02-03 DIAGNOSIS — M79671 Pain in right foot: Secondary | ICD-10-CM | POA: Diagnosis not present

## 2016-02-08 DIAGNOSIS — E039 Hypothyroidism, unspecified: Secondary | ICD-10-CM | POA: Diagnosis not present

## 2016-02-08 DIAGNOSIS — G35 Multiple sclerosis: Secondary | ICD-10-CM | POA: Diagnosis not present

## 2016-02-08 DIAGNOSIS — I10 Essential (primary) hypertension: Secondary | ICD-10-CM | POA: Diagnosis not present

## 2016-02-09 ENCOUNTER — Encounter: Payer: Self-pay | Admitting: Internal Medicine

## 2016-02-09 ENCOUNTER — Non-Acute Institutional Stay (SKILLED_NURSING_FACILITY): Payer: Medicare Other | Admitting: Internal Medicine

## 2016-02-09 DIAGNOSIS — G35 Multiple sclerosis: Secondary | ICD-10-CM

## 2016-02-09 DIAGNOSIS — H18892 Other specified disorders of cornea, left eye: Secondary | ICD-10-CM

## 2016-02-09 DIAGNOSIS — N319 Neuromuscular dysfunction of bladder, unspecified: Secondary | ICD-10-CM | POA: Diagnosis not present

## 2016-02-09 DIAGNOSIS — H18062 Stromal corneal pigmentations, left eye: Secondary | ICD-10-CM

## 2016-02-09 DIAGNOSIS — B3731 Acute candidiasis of vulva and vagina: Secondary | ICD-10-CM

## 2016-02-09 DIAGNOSIS — B373 Candidiasis of vulva and vagina: Secondary | ICD-10-CM | POA: Diagnosis not present

## 2016-02-09 NOTE — Progress Notes (Signed)
Patient ID: Shannon Garrett, female   DOB: Mar 14, 1961, 55 y.o.   MRN: [id number]    DATE: 02/09/16  Location:    [facility] Room Number: 230 B Place of Service: SNF (31)   Extended Emergency Contact Information Primary Emergency Contact: [place], [place] [address] [place] Phone: [phone] Relation: Sister Secondary Emergency Contact: Jefm Bryant, [place] Phone: [phone] Mobile Phone: [phone] Relation: Friend  Advanced Directive information Does patient have an advance directive?: Yes, Type of Advance Directive: Out of facility DNR (pink MOST or yellow form), Does patient want to make changes to advanced directive?: No - Patient declined  Chief Complaint  Patient presents with  . Acute Visit    Left eye red    HPI:  55 yo female long term resident seen today for OS redness. She has a hx MS, adrenal insufficiency, paraplegic and severe malnutrition and FTT. She noticed OS red this AM. No pain or d/c. No known trauma. No change in vision. She does have a hx glaucoma and uses eye gtts.  She also c/o vaginal itching but has not seen a d/c. She was tx recently for vaginal yeast infection with diflucan and she requests another round of tx.  She has a neurogenic bladder which increases her likelihood of developing vaginal yeast infections.  Denies sexual activity.   She is a poor historian due to psych d/o. Hx obtained from chart  MS - slowly declining and is followed by neurology;  She has paraplegia. Takes amantadine 100 mg daily, baclofen 20 mg tid prn  for muscle spasms.  She last received her tysabri on 10-25-15. She was seen by neurology Dr Felecia Shelling in July and tx options discussed due to missed tysabri infusions. She was checked for TB and hepB which were neg. Recommended ocrelizumab tx (FDA approved for relapsing form of MS). Neuro also rx Phentermine trial to assist with weight  loss for morbid obesity  Neurogenic bladder - due to MS. She takes flomax 0.4 mg daily;  myrbetriq 25 mg daily    Depression/insomnia - mood stable on cymbalta 60 mg daily; ambien 5 mg nightly for sleep   Glaucoma - uses xalatan to both eyes.     Past Medical History:  Diagnosis Date  . Cardiomyopathy ([place])    a.  Echo 04/29/12: Mild LVH, EF 20-25%, mild AI, moderate MR, moderate LAE, mild RAE, mild RVE, moderate TR, PASP 51, small pericardial effusion;   b. probably non-ischemic given multiple chemo-Tx agents used for MS and global LV dysfn on echo  . Chronic systolic heart failure ([place])   . Depression   . Glaucoma   . Hypertension   . MS (multiple sclerosis) ([place])    a. Dx'd late 20's. b. Tx with Novantrone, Tysabri, Copaxone previously.    Past Surgical History:  Procedure Laterality Date  . ABLATION     uterine  . CESAREAN SECTION      Patient Care Team: Gildardo Cranker, DO as PCP - General (Internal Medicine) Gerlene Fee, NP as Nurse Practitioner (Nurse Practitioner)  Social History   Social History  . Marital status: Single    Spouse name: N/A  . Number of children: N/A  . Years of education: N/A   Occupational History  . Not on file.   Social History Main Topics  . Smoking status: Former  Smoker  . Smokeless tobacco: Never Used  . Alcohol use No  . Drug use: No  . Sexual activity: Not Currently   Other Topics Concern  . Not on file   Social History Narrative  . No narrative on file     reports that she has quit smoking. She has never used smokeless tobacco. She reports that she does not drink alcohol or use drugs.  Family History  Problem Relation Age of Onset  . Hypertension Mother   . Cancer Mother     breast   . Cancer Father     prostate  . Multiple sclerosis Sister   . Heart attack Neg Hx    Family Status  Relation Status  . Mother Alive  . Father Deceased  . Sister   . Neg Hx     Immunization History  Administered Date(s)  Administered  . Influenza,inj,Quad PF,36+ Mos 08/01/2013  . Influenza-Unspecified 08/31/2014, 08/09/2015  . Pneumococcal Polysaccharide-23 08/01/2013  . Pneumococcal-Unspecified 08/30/2013, 07/29/2014    Allergies  Allergen Reactions  . Sulfonamide Derivatives Hives and Shortness Of Breath  . Penicillins Hives and Itching    Has patient had a PCN reaction causing immediate rash, facial/tongue/throat swelling, SOB or lightheadedness with hypotension: unknown Has patient had a PCN reaction causing severe rash involving mucus membranes or skin necrosis: unknown Has patient had a PCN reaction that required hospitalization: unknown Has patient had a PCN reaction occurring within the last 10 years: unknown If all of the above answers are "NO", then may proceed with Cephalosporin use. Prior course of rocephin charted 05/2015    Medications: Patient's Medications  New Prescriptions   No medications on file  Previous Medications   ACETAMINOPHEN (TYLENOL) 325 MG TABLET    Take 2 tablets (650 mg total) by mouth every 6 (six) hours as needed for mild pain or fever (or Fever >/= 101).   ALBUTEROL (PROVENTIL) (2.5 MG/3ML) 0.083% NEBULIZER SOLUTION    Take 3 mLs (2.5 mg total) by nebulization every 2 (two) hours as needed for wheezing.   AMANTADINE (SYMMETREL) 100 MG CAPSULE    Take 100 mg by mouth daily.    BACLOFEN (LIORESAL) 20 MG TABLET    Take 1 tablet (20 mg total) by mouth 3 (three) times daily as needed for muscle spasms.   CARVEDILOL (COREG) 25 MG TABLET    Take 1 tablet (25 mg total) by mouth 2 (two) times daily with a meal.   CHOLECALCIFEROL (VITAMIN D-3 PO)    Take 1,000 Int'l Units/day by mouth daily.    CRANBERRY PO    Take 100 mg by mouth daily.   CYANOCOBALAMIN (VITAMIN B-12 PO)    Take 1 tablet by mouth daily.   DULOXETINE (CYMBALTA) 60 MG CAPSULE    Take 60 mg by mouth daily.   FAMOTIDINE (PEPCID) 20 MG TABLET    Take 1 tablet (20 mg total) by mouth 2 (two) times daily.    FUROSEMIDE (LASIX) 40 MG TABLET    Take 1 tablet (40 mg total) by mouth daily.   GABAPENTIN (NEURONTIN) 100 MG CAPSULE    Take 7 capsules (700 mg total) by mouth 2 (two) times daily.   LACTASE (LACTAID PO)    Take 1 tablet by mouth daily.   LATANOPROST (XALATAN) 0.005 % OPHTHALMIC SOLUTION    Place 1 drop into both eyes at bedtime.   MAGNESIUM HYDROXIDE (MILK OF MAGNESIA) 400 MG/5ML SUSPENSION    Take 30 mLs by mouth every 8 (eight)  hours as needed for mild constipation or moderate constipation.   MIRABEGRON ER (MYRBETRIQ) 25 MG TB24 TABLET    Take 50 mg by mouth at bedtime.    ONDANSETRON (ZOFRAN) 4 MG TABLET    Take 1 tablet (4 mg total) by mouth every 6 (six) hours as needed for nausea.   PHENTERMINE 37.5 MG CAPSULE    Take 1 capsule (37.5 mg total) by mouth every morning.   POLYETHYLENE GLYCOL (MIRALAX / GLYCOLAX) PACKET    Take 17 g by mouth daily as needed for mild constipation.   POTASSIUM CHLORIDE (KLOR-CON) 20 MEQ PACKET    Take 20 mEq by mouth daily.    PROPYLENE GLYCOL (SYSTANE BALANCE) 0.6 % SOLN    Place 1 drop into both eyes 2 (two) times daily.   SENNA-DOCUSATE (SENOKOT S) 8.6-50 MG TABLET    Take 1 tablet by mouth 2 (two) times daily.   SPIRONOLACTONE (ALDACTONE) 25 MG TABLET    Take 25 mg by mouth daily. For CHF   TAMSULOSIN (FLOMAX) 0.4 MG CAPS CAPSULE    Take 0.4 mg by mouth daily. For incontinence   ZOLPIDEM (AMBIEN) 5 MG TABLET    Take 5 mg by mouth at bedtime.  Modified Medications   No medications on file  Discontinued Medications   OCRELIZUMAB 600 MG IN SODIUM CHLORIDE 0.9 % 500 ML    Inject 600 mg into the vein once.    Review of Systems  Unable to perform ROS: Psychiatric disorder    Vitals:   02/09/16 1257  BP: 137/75  Pulse: 77  Resp: 18  Temp: (!) 96 F (35.6 C)  TempSrc: Oral  Weight: 235 lb (106.6 kg)  Height: 5' 5" (1.651 m)   Body mass index is 39.11 kg/m.  Physical Exam  Constitutional: She appears well-developed.  Frail appearing in NAD,  lying in bed  HENT:  Mouth/Throat: Oropharynx is clear and moist. No oropharyngeal exudate.  Eyes: Conjunctivae and EOM are normal. Pupils are equal, round, and reactive to light. Right eye exhibits no discharge. Left eye exhibits no discharge. No scleral icterus.  OS corneal hemorrhage. No left orbital TTP  Neck: Neck supple.  Musculoskeletal: She exhibits edema and tenderness.  Neurological: She is alert.  Skin: Skin is warm and dry. No rash noted.  Psychiatric: Her behavior is normal. Thought content normal. She exhibits a depressed mood.     Labs reviewed: Office Visit on 01/25/2016  Component Date Value Ref Range Status  . WBC 01/26/2016 5.3  3.4 - 10.8 x10E3/uL Final  . RBC 01/26/2016 3.90  3.77 - 5.28 x10E6/uL Final  . Hemoglobin 01/26/2016 10.3* 11.1 - 15.9 g/dL Final  . Hematocrit 01/26/2016 31.9* 34.0 - 46.6 % Final  . MCV 01/26/2016 82  79 - 97 fL Final  . MCH 01/26/2016 26.4* 26.6 - 33.0 pg Final  . MCHC 01/26/2016 32.3  31.5 - 35.7 g/dL Final  . RDW 01/26/2016 17.5* 12.3 - 15.4 % Final  . Platelets 01/26/2016 281  150 - 379 x10E3/uL Final  . Neutrophils 01/26/2016 56  % Final  . Lymphs 01/26/2016 29  % Final  . Monocytes 01/26/2016 8  % Final  . Eos 01/26/2016 6  % Final  . Basos 01/26/2016 1  % Final  . Neutrophils Absolute 01/26/2016 3.0  1.4 - 7.0 x10E3/uL Final  . Lymphocytes Absolute 01/26/2016 1.5  0.7 - 3.1 x10E3/uL Final  . Monocytes Absolute 01/26/2016 0.4  0.1 - 0.9 x10E3/uL Final  . EOS (  ABSOLUTE) 01/26/2016 0.3  0.0 - 0.4 x10E3/uL Final  . Basophils Absolute 01/26/2016 0.1  0.0 - 0.2 x10E3/uL Final  . Immature Granulocytes 01/26/2016 0  % Final  . Immature Grans (Abs) 01/26/2016 0.0  0.0 - 0.1 x10E3/uL Final  . Hep B Surface Ab, Qual 01/26/2016 Non Reactive   Final   Comment:               Non Reactive: Inconsistent with immunity,                             less than 10 mIU/mL               Reactive:     Consistent with immunity,                              greater than 9.9 mIU/mL   . Hep B Core Total Ab 01/26/2016 Negative  Negative Final  . QUANTIFERON INCUBATION 01/29/2016 Comment   Final  . Glucose 01/26/2016 113* 65 - 99 mg/dL Final  . BUN 01/26/2016 16  6 - 24 mg/dL Final  . Creatinine, Ser 01/26/2016 1.20* 0.57 - 1.00 mg/dL Final  . GFR calc non Af Amer 01/26/2016 52* >59 mL/min/1.73 Final  . GFR calc Af Amer 01/26/2016 60  >59 mL/min/1.73 Final  . BUN/Creatinine Ratio 01/26/2016 13  9 - 23 Final  . Sodium 01/26/2016 142  134 - 144 mmol/L Final  . Potassium 01/26/2016 4.2  3.5 - 5.2 mmol/L Final  . Chloride 01/26/2016 103  96 - 106 mmol/L Final  . CO2 01/26/2016 23  18 - 29 mmol/L Final  . Calcium 01/26/2016 9.0  8.7 - 10.2 mg/dL Final  . Total Protein 01/26/2016 6.8  6.0 - 8.5 g/dL Final  . Albumin 01/26/2016 3.8  3.5 - 5.5 g/dL Final  . Globulin, Total 01/26/2016 3.0  1.5 - 4.5 g/dL Final  . Albumin/Globulin Ratio 01/26/2016 1.3  1.2 - 2.2 Final  . Bilirubin Total 01/26/2016 <0.2  0.0 - 1.2 mg/dL Final  . Alkaline Phosphatase 01/26/2016 138* 39 - 117 IU/L Final  . AST 01/26/2016 12  0 - 40 IU/L Final  . ALT 01/26/2016 12  0 - 32 IU/L Final  . Bilirubin, Direct 01/26/2016 0.06  0.00 - 0.40 mg/dL Final  . QUANTIFERON TB GOLD 01/29/2016 Negative  Negative Final  . QUANTIFERON CRITERIA 01/29/2016 Comment   Final   Comment: To be considered positive a specimen should have a TB Ag minus Nil value greater than or equal to 0.35 IU/mL and in addition the TB Ag minus Nil value must be greater than or equal to 25% of the Nil value. There may be insufficient information in these values to differentiate between some negative and some indeterminate test values.   . QUANTIFERON TB AG VALUE 01/29/2016 0.03  IU/mL Final  . Quantiferon Nil Value 01/29/2016 0.03  IU/mL Final  . QUANTIFERON MITOGEN VALUE 01/29/2016 >10.00  IU/mL Final  . QFT TB AG MINUS NIL VALUE 01/29/2016 0.00  IU/mL Final  . Interpretation: 01/29/2016 Comment   Final    Comment: The QuantiFERON TB Gold (in Tube) assay is intended for use as an aid in the diagnosis of TB infection. Negative results suggest that there is no TB infection. In patients with high suspicion of exposure, a negative test should be repeated. A positive test indicates infection with Mycobacterium  tuberculosis. Among individuals without tuberculosis infection, a positive test may be due to exposure to [place], M. szulgai or M. marinum. On the Internet, go to https://figueroa-lambert.info/ for further details.   Nursing Home on 12/09/2015  Component Date Value Ref Range Status  . Hemoglobin 11/16/2015 9.4* 12.0 - 16.0 g/dL Final  . HCT 11/16/2015 31* 36 - 46 % Final  . Platelets 11/16/2015 249  150 - 399 K/L Final  . WBC 11/16/2015 8.2  10^3/mL Final  . Glucose 11/16/2015 89  mg/dL Final  . BUN 11/16/2015 15  4 - 21 mg/dL Final  . Creatinine 11/16/2015 0.8  0.5 - 1.1 mg/dL Final  . Potassium 11/16/2015 3.9  3.4 - 5.3 mmol/L Final  . Sodium 11/16/2015 141  137 - 147 mmol/L Final  . Alkaline Phosphatase 11/16/2015 126* 25 - 125 U/L Final  . ALT 11/16/2015 13  7 - 35 U/L Final  . AST 11/16/2015 13  13 - 35 U/L Final  . Bilirubin, Total 11/16/2015 0.2  mg/dL Final  Admission on 11/07/2015, Discharged on 11/10/2015  Component Date Value Ref Range Status  . Ammonia 11/07/2015 22  9 - 35 umol/L Final  . WBC 11/08/2015 9.9  4.0 - 10.5 K/uL Final  . RBC 11/08/2015 4.13  3.87 - 5.11 MIL/uL Final  . Hemoglobin 11/08/2015 11.0* 12.0 - 15.0 g/dL Final  . HCT 11/08/2015 33.8* 36.0 - 46.0 % Final  . MCV 11/08/2015 81.8  78.0 - 100.0 fL Final  . MCH 11/08/2015 26.6  26.0 - 34.0 pg Final  . MCHC 11/08/2015 32.5  30.0 - 36.0 g/dL Final  . RDW 11/08/2015 16.9* 11.5 - 15.5 % Final  . Platelets 11/08/2015 322  150 - 400 K/uL Final  . Sodium 11/08/2015 148* 135 - 145 mmol/L Final  . Potassium 11/08/2015 4.3  3.5 - 5.1 mmol/L Final  . Chloride 11/08/2015 110  101 - 111 mmol/L Final  . CO2 11/08/2015 28  22  - 32 mmol/L Final  . Glucose, Bld 11/08/2015 130* 65 - 99 mg/dL Final  . BUN 11/08/2015 23* 6 - 20 mg/dL Final  . Creatinine, Ser 11/08/2015 1.24* 0.44 - 1.00 mg/dL Final  . Calcium 11/08/2015 9.6  8.9 - 10.3 mg/dL Final  . Total Protein 11/08/2015 7.8  6.5 - 8.1 g/dL Final  . Albumin 11/08/2015 4.0  3.5 - 5.0 g/dL Final  . AST 11/08/2015 21  15 - 41 U/L Final  . ALT 11/08/2015 20  14 - 54 U/L Final  . Alkaline Phosphatase 11/08/2015 144* 38 - 126 U/L Final  . Total Bilirubin 11/08/2015 0.4  0.3 - 1.2 mg/dL Final  . GFR calc non Af Amer 11/08/2015 49* >60 mL/min Final  . GFR calc Af Amer 11/08/2015 57* >60 mL/min Final   Comment: (NOTE) The eGFR has been calculated using the CKD EPI equation. This calculation has not been validated in all clinical situations. eGFR's persistently <60 mL/min signify possible Chronic Kidney Disease.   . Anion gap 11/08/2015 10  5 - 15 Final  . MRSA by PCR 11/08/2015 NEGATIVE  NEGATIVE Final   Comment:        The GeneXpert MRSA Assay (FDA approved for NASAL specimens only), is one component of a comprehensive MRSA colonization surveillance program. It is not intended to diagnose MRSA infection nor to guide or monitor treatment for MRSA infections.   . Sodium 11/09/2015 143  135 - 145 mmol/L Final  . Potassium 11/09/2015 4.1  3.5 - 5.1 mmol/L  Final  . Chloride 11/09/2015 108  101 - 111 mmol/L Final  . CO2 11/09/2015 24  22 - 32 mmol/L Final  . Glucose, Bld 11/09/2015 112* 65 - 99 mg/dL Final  . BUN 11/09/2015 22* 6 - 20 mg/dL Final  . Creatinine, Ser 11/09/2015 1.07* 0.44 - 1.00 mg/dL Final  . Calcium 11/09/2015 9.1  8.9 - 10.3 mg/dL Final  . GFR calc non Af Amer 11/09/2015 59* >60 mL/min Final  . GFR calc Af Amer 11/09/2015 >60  >60 mL/min Final   Comment: (NOTE) The eGFR has been calculated using the CKD EPI equation. This calculation has not been validated in all clinical situations. eGFR's persistently <60 mL/min signify possible Chronic  Kidney Disease.   . Anion gap 11/09/2015 11  5 - 15 Final    No results found.   Assessment/Plan   ICD-9-CM ICD-10-CM   1. Corneal hemorrhage of left eye 371.12 H18.892   2. Yeast vaginitis 112.1 B37.3   3. MULTIPLE SCLEROSIS, PROGRESSIVE/RELAPSING 340 G35   4. Neurogenic bladder 596.54 N31.9     Reassurance given for corneal hemorrhage. May take 2 or more weeks to resolve  Rx diflucan 112m po today and repeat in 1 week  Cont other meds as ordered  PT/OT/ST as indicated  F/u with specialists as scheduled  Will follow  Raeshawn Vo S. CPerlie Gold PUcsd-[place] [address] [id number]([phone] (Monday-Friday 8 AM - 5 PM) ([phone] 5 PM and follow prompts

## 2016-02-22 DIAGNOSIS — I10 Essential (primary) hypertension: Secondary | ICD-10-CM | POA: Diagnosis not present

## 2016-02-24 ENCOUNTER — Encounter: Payer: Self-pay | Admitting: Internal Medicine

## 2016-02-24 ENCOUNTER — Non-Acute Institutional Stay (SKILLED_NURSING_FACILITY): Payer: Medicare Other | Admitting: Internal Medicine

## 2016-02-24 DIAGNOSIS — N319 Neuromuscular dysfunction of bladder, unspecified: Secondary | ICD-10-CM | POA: Diagnosis not present

## 2016-02-24 DIAGNOSIS — I5042 Chronic combined systolic (congestive) and diastolic (congestive) heart failure: Secondary | ICD-10-CM

## 2016-02-24 DIAGNOSIS — G35 Multiple sclerosis: Secondary | ICD-10-CM

## 2016-02-24 DIAGNOSIS — R600 Localized edema: Secondary | ICD-10-CM

## 2016-02-24 DIAGNOSIS — G35D Multiple sclerosis, unspecified: Secondary | ICD-10-CM

## 2016-02-24 NOTE — Progress Notes (Signed)
Patient ID: Shannon Garrett, female   DOB: 05-Jul-1960, 55 y.o.   MRN: [id number]   Location:   [facility] Room Number: 230-B Place of Service:  SNF (31) Provider:  Granville Lewis, PA-C  Gildardo Cranker, DO  Patient Care Team: Gildardo Cranker, DO as PCP - General (Internal Medicine) Gerlene Fee, NP as Nurse Practitioner (Nurse Practitioner)  Extended Emergency Contact Information Primary Emergency Contact: [place], [place] [address] [place] Phone: [phone] Relation: Sister Secondary Emergency Contact: Jefm Bryant, [place] Phone: [phone] Mobile Phone: [phone] Relation: Friend  Goals of care: Advanced Directive information Advanced Directives 02/24/2016  Does patient have an advance directive? Yes  Type of Advance Directive Out of facility DNR (pink MOST or yellow form)  Does patient want to make changes to advanced directive? -  Copy of advanced directive(s) in chart? Yes  Would patient like information on creating an advanced directive? -  Pre-existing out of facility DNR order (yellow form or pink MOST form) -     Chief Complaint  Patient presents with  . Acute Visit    Edema    HPI:  Pt is a 55 y.o. female seen today for an acute visit foFollow-up of edema.-Nursing staff left a note about possible increased edema.  However speaking with patient she says her edema has grossly gotten better here recently-her weight currently is 232.8  which is down from 235 previously.  She is not complaining of any shortness of breath or chest pain.  She does have a history of combined diastolic and systolic CHF-ejection fraction most recently noted to be 60-65 percent.  She is on Aldactone 25 mg a day as well as torsemide 20 mg a day.   She does have a history of multiple sclerosis appears to be slowly dressing per review previous notes-she has neurogenic bladder-has Flomax  0.4 mg a day and  myrbetriq 25 mg a day-she says she is voiding although at times feels she isn't voiding as well as sheused to.  Currently her vital signs are stable  r    Past Medical History:  Diagnosis Date  . Cardiomyopathy ([place])    a.  Echo 04/29/12: Mild LVH, EF 20-25%, mild AI, moderate MR, moderate LAE, mild RAE, mild RVE, moderate TR, PASP 51, small pericardial effusion;   b. probably non-ischemic given multiple chemo-Tx agents used for MS and global LV dysfn on echo  . Chronic systolic heart failure ([place])   . Depression   . Glaucoma   . Hypertension   . MS (multiple sclerosis) ([place])    a. Dx'd late 20's. b. Tx with Novantrone, Tysabri, Copaxone previously.   Past Surgical History:  Procedure Laterality Date  . ABLATION     uterine  . CESAREAN SECTION      Allergies  Allergen Reactions  . Sulfonamide Derivatives Hives and Shortness Of Breath  . Penicillins Hives and Itching    Has patient had a PCN reaction causing immediate rash, facial/tongue/throat swelling, SOB or lightheadedness with hypotension: unknown Has patient had a PCN reaction causing severe rash involving mucus membranes or skin necrosis: unknown Has patient had a PCN reaction that required hospitalization: unknown Has patient had a PCN reaction occurring within the last 10 years: unknown If all of the above answers are "NO", then may proceed with Cephalosporin use. Prior course of rocephin  charted 05/2015      Medication List       Accurate as of 02/24/16  2:21 PM. Always use your most recent med list.          acetaminophen 325 MG tablet Commonly known as:  TYLENOL Take 2 tablets (650 mg total) by mouth every 6 (six) hours as needed for mild pain or fever (or Fever >/= 101).   albuterol (2.5 MG/3ML) 0.083% nebulizer solution Commonly known as:  PROVENTIL Take 3 mLs (2.5 mg total) by nebulization every 2 (two) hours as needed for wheezing.   amantadine 100 MG capsule Commonly known as:   SYMMETREL Take 100 mg by mouth daily.   baclofen 20 MG tablet Commonly known as:  LIORESAL Take 1 tablet (20 mg total) by mouth 3 (three) times daily as needed for muscle spasms.   carvedilol 25 MG tablet Commonly known as:  COREG Take 1 tablet (25 mg total) by mouth 2 (two) times daily with a meal.   CRANBERRY PO Take 100 mg by mouth daily.   DULoxetine 60 MG capsule Commonly known as:  CYMBALTA Take 60 mg by mouth daily.   famotidine 20 MG tablet Commonly known as:  PEPCID Take 1 tablet (20 mg total) by mouth 2 (two) times daily.   gabapentin 100 MG capsule Commonly known as:  NEURONTIN Take 7 capsules (700 mg total) by mouth 2 (two) times daily.   LACTAID PO Take 1 tablet by mouth daily.   latanoprost 0.005 % ophthalmic solution Commonly known as:  XALATAN Place 1 drop into both eyes at bedtime.   magnesium hydroxide 400 MG/5ML suspension Commonly known as:  MILK OF MAGNESIA Take 30 mLs by mouth every 8 (eight) hours as needed for mild constipation or moderate constipation.   mirabegron ER 25 MG Tb24 tablet Commonly known as:  MYRBETRIQ Take 50 mg by mouth at bedtime.   ondansetron 4 MG tablet Commonly known as:  ZOFRAN Take 1 tablet (4 mg total) by mouth every 6 (six) hours as needed for nausea.   phentermine 37.5 MG capsule Take 1 capsule (37.5 mg total) by mouth every morning.   polyethylene glycol packet Commonly known as:  MIRALAX / GLYCOLAX Take 17 g by mouth daily as needed for mild constipation.   potassium chloride 20 MEQ packet Commonly known as:  KLOR-CON Take 20 mEq by mouth daily.   SENOKOT S 8.6-50 MG tablet Generic drug:  senna-docusate Take 1 tablet by mouth 2 (two) times daily.   spironolactone 25 MG tablet Commonly known as:  ALDACTONE Take 25 mg by mouth daily. For CHF   SYSTANE BALANCE 0.6 % Soln Generic drug:  Propylene Glycol Place 1 drop into both eyes 2 (two) times daily.   tamsulosin 0.4 MG Caps capsule Commonly known  as:  FLOMAX Take 0.4 mg by mouth daily. For incontinence   torsemide 20 MG tablet Commonly known as:  DEMADEX Take 20 mg by mouth daily.   VITAMIN B-12 PO Take 1 tablet by mouth daily.   VITAMIN D-3 PO Take 1,000 Int'l Units/day by mouth daily.   zolpidem 5 MG tablet Commonly known as:  AMBIEN Take 5 mg by mouth at bedtime.       Review of Systems   Constitutional: Negative for malaise/fatigue.  Respiratory: Negative for cough and shortness of breath.   Cardiovascular: Negative for chest pain, palpitations and leg swelling. Edema t appears to be slightly improved per patient  Gastrointestinal: Negative for heartburn, abdominal pain  and constipation.  Musculoskeletal: Negative for myalgias, back pain and joint pain GU history of neurogenic bladder as noted above not complaining of burning with urination.  Skin: Negative.   Neurological: Negative for dizziness.  Psychiatric/Behavioral: The patient is not nervous/anxious Immunization History  Administered Date(s) Administered  . Influenza,inj,Quad PF,36+ Mos 08/01/2013  . Influenza-Unspecified 08/31/2014, 08/09/2015  . Pneumococcal Polysaccharide-23 08/01/2013  . Pneumococcal-Unspecified 08/30/2013, 07/29/2014   Pertinent  Health Maintenance Due  Topic Date Due  . PAP SMEAR  02/04/2000  . MAMMOGRAM  11/11/2012  . COLONOSCOPY  11/11/2012  . INFLUENZA VACCINE  01/31/2016   No flowsheet data found. Functional Status Survey:    Vitals:   02/24/16 1409  BP: 137/90  Pulse: 80  Resp: 18  Temp: 97.3 F (36.3 C)  TempSrc: Oral  SpO2: 98%  Weight: 232 lb 8 oz (105.5 kg)  Height: 5\' 5"  (1.651 m)   Body mass index is 38.69 kg/m. Physical Exam Constitutional: . No distress.  Overweight      Cardiovascular: Normal rate, regular rhythm  she has 1+- lower extremity edema Respiratory: Effort normal and breath sounds normal. No respiratory distress.  GI: Soft. Bowel sounds are normal. She exhibits no distension.  There is no tenderness.  Musculoskeletal:  Is able to move right  upper extremity Limited movement in her lower extremities  and left upper extremity  Neurological: alert and oriented X 3   .  Skin: Skin is warm and dry. She is not diaphoretic.  Labs reviewed:  Recent Labs  07/20/15 0503 07/21/15 0450 07/22/15 0540 07/23/15 0450  11/08/15 0042 11/09/15 0441 11/16/15 01/25/16 1641  NA 141 135 142 142  < > 148* 143 141 142  K 3.1* 3.0* 3.2* 4.9  < > 4.3 4.1 3.9 4.2  CL 108 105 110 109  < > 110 108  --  103  CO2 25 23 24 25   < > 28 24  --  23  GLUCOSE 107* 296* 118* 135*  < > 130* 112*  --  113*  BUN 7 <5* 5* 7  < > 23* 22* 15 16  CREATININE 0.54 0.59 0.64 0.67  < > 1.24* 1.07* 0.8 1.20*  CALCIUM 8.6* 8.4* 8.3* 9.1  < > 9.6 9.1  --  9.0  MG 1.8 1.7 1.6* 2.2  --   --   --   --   --   PHOS 2.5 2.6 2.9  --   --   --   --   --   --   < > = values in this interval not displayed.  Recent Labs  11/07/15 1406 11/08/15 0042 11/16/15 01/25/16 1641  AST 22 21 13 12   ALT 23 20 13 12   ALKPHOS 153* 144* 126* 138*  BILITOT 0.5 0.4  --  <0.2  PROT 8.4* 7.8  --  6.8  ALBUMIN 4.2 4.0  --  3.8    Recent Labs  07/10/15 1124  11/07/15 1406 11/08/15 0042 11/16/15 01/25/16 1641  WBC 6.1  < > 7.5 9.9 8.2 5.3  NEUTROABS 3.8  --  3.8  --   --  3.0  HGB 11.7*  < > 11.6* 11.0* 9.4*  --   HCT 36.0  < > 35.6* 33.8* 31* 31.9*  MCV 82.6  < > 82.2 81.8  --  82  PLT 276  < > 346 322 249 281  < > = values in this interval not displayed. Lab Results  Component Value Date  TSH 0.557 07/10/2015   Lab Results  Component Value Date   HGBA1C 5.3 01/11/2015   Lab Results  Component Value Date   CHOL 145 05/01/2012   HDL 23 (L) 05/01/2012   LDLCALC 108 (H) 05/01/2012   TRIG 72 05/01/2012   CHOLHDL 6.3 05/01/2012    Significant Diagnostic Results in last 30 days:  No results found.  Assessment/Plan  #1 edema with history of combined systolic and diastolic CHF-her weight actually  appears to be going down-and feels her edema is slowly improving-continue to monitor her weights closely-she is not complaining of any chest pain or shortness of breath but this will have to be monitored.  She continues on torsemide 20 mg a day with potassium supplementation-she is also on Aldactone 25 mg a day.  We will need an updated BMP on Monday, August 28 to keep an eye on her electrolytes.  #2 renal insufficiency this appears to be variable most recent creatinine unknown 01/25/2016 was 1.2 which appears to be on the higher end of her baseline again will await laboratory results clinically she appears to be doing well however.  #3 history of multiple sclerosis-at this point continue supportive care she does have a neurogenic bladder she says she is voiding but this will have to be watched as well.   continues on amantadine 100 mg a day-baclofen 20 mg 3 times a day when necessary   [id number]

## 2016-02-27 DIAGNOSIS — Z79899 Other long term (current) drug therapy: Secondary | ICD-10-CM | POA: Diagnosis not present

## 2016-03-01 ENCOUNTER — Encounter: Payer: Self-pay | Admitting: *Deleted

## 2016-03-01 ENCOUNTER — Telehealth: Payer: Self-pay | Admitting: Neurology

## 2016-03-01 NOTE — Telephone Encounter (Signed)
Pt called wanting to know the status of ocrevus. Please call

## 2016-03-01 NOTE — Telephone Encounter (Signed)
Message printed and placed in Shannon Garrett's in-box in the infusion suite/fim 

## 2016-03-02 ENCOUNTER — Encounter: Payer: Self-pay | Admitting: Internal Medicine

## 2016-03-02 ENCOUNTER — Non-Acute Institutional Stay (SKILLED_NURSING_FACILITY): Payer: Medicare Other | Admitting: Internal Medicine

## 2016-03-02 DIAGNOSIS — G629 Polyneuropathy, unspecified: Secondary | ICD-10-CM | POA: Diagnosis not present

## 2016-03-02 DIAGNOSIS — G35 Multiple sclerosis: Secondary | ICD-10-CM

## 2016-03-02 DIAGNOSIS — R350 Frequency of micturition: Secondary | ICD-10-CM | POA: Diagnosis not present

## 2016-03-02 DIAGNOSIS — G35D Multiple sclerosis, unspecified: Secondary | ICD-10-CM

## 2016-03-02 DIAGNOSIS — I5032 Chronic diastolic (congestive) heart failure: Secondary | ICD-10-CM | POA: Diagnosis not present

## 2016-03-02 DIAGNOSIS — D649 Anemia, unspecified: Secondary | ICD-10-CM

## 2016-03-02 DIAGNOSIS — R232 Flushing: Secondary | ICD-10-CM | POA: Diagnosis not present

## 2016-03-02 NOTE — Progress Notes (Signed)
Location:   [facility] Room Number: 230/B Place of Service:  SNF (31) Provider:  Si Gaul, DO  Patient Care Team: Gildardo Cranker, DO as PCP - General (Internal Medicine) Gerlene Fee, NP as Nurse Practitioner (Nurse Practitioner)  Extended Emergency Contact Information Primary Emergency Contact: [place], [place] [address] [place] Phone: [phone] Relation: Sister Secondary Emergency Contact: Jefm Bryant, [place] Phone: [phone] Mobile Phone: [phone] Relation: Friend  Code Status:  DNR Goals of care: Advanced Directive information Advanced Directives 03/22/2016  Does patient have an advance directive? Yes  Type of Advance Directive Out of facility DNR (pink MOST or yellow form)  Does patient want to make changes to advanced directive? No - Patient declined  Copy of advanced directive(s) in chart? Yes  Would patient like information on creating an advanced directive? -  Pre-existing out of facility DNR order (yellow form or pink MOST form) -     Chief Complaint  Patient presents with  . Medical Management of Chronic Issues    Routine Visit  Medical management of chronic medical conditions including multiple sclerosis-CHF-depression-GERD-neuropathy-neurogenic bladder-  Also acute visit follow-up on facial flushing earlier today  HPI:  Pt is a 55 y.o. female seen today for medical management of chronic diseases. As noted above.  Nursing also states she had a short episode of facial flushing this morning-apparently she had put on a cream on her face that apparently had some sort of reaction apparently there was no sign of anaphylaxis or lip swelling no swelling or respiratory distress.  Vital signs are stable she states she would not uses cream again-apparently was provided by family.  Her other medical issues appear to be relatively  stable although she appears to be having some decline with her multiple sclerosis she is on amantadine as well as baclofen and is followed closely by neurology.  Regards to CHF she is on Demadex 20 mg a day now as well as Aldactone 25 mg a day appears to have moderated appears her weights are relatively stable most recent weight listed on August 29 was 234.4 this is actually up 2 pounds from her weight on August 25 but still relatively her baseline Will order frequent weights and notify provider of any significant weight gain she thinks her edema is improved somewhat she does not complain of any shortness of breath or chest pain.  She also continues on Coreg in addition to the Demadex.  She also has a history of depression continues on Cymbalta in this appears to be relatively stable.  Regards to urinary frequency at times this is an issue she is on Myrbetriq  as well as Flomax in neurology is following this as well  Currently she has no acute complaints she says the facial swelling was transitory and was not an allergic reaction she is visiting with her sister today and appears to be at her baseline.  Vital signs continued to be stable.         Past Medical History:  Diagnosis Date  . Cardiomyopathy ([place])    a.  Echo 04/29/12: Mild LVH, EF 20-25%, mild AI, moderate MR, moderate LAE, mild RAE, mild RVE, moderate TR, PASP 51, small pericardial effusion;   b. probably non-ischemic given multiple chemo-Tx agents used for MS and global LV dysfn on echo  .  Chronic systolic heart failure ([place])   . Depression   . Glaucoma   . Hypertension   . MS (multiple sclerosis) ([place])    a. Dx'd late 20's. b. Tx with Novantrone, Tysabri, Copaxone previously.   Past Surgical History:  Procedure Laterality Date  . ABLATION     uterine  . CESAREAN SECTION      Allergies  Allergen Reactions  . Sulfonamide Derivatives Hives and Shortness Of Breath  . Penicillins Hives and Itching      Medication  List       Accurate as of 03/02/16 11:59 PM. Always use your most recent med list.          acetaminophen 325 MG tablet Commonly known as:  TYLENOL Take 2 tablets (650 mg total) by mouth every 6 (six) hours as needed for mild pain or fever (or Fever >/= 101).   albuterol (2.5 MG/3ML) 0.083% nebulizer solution Commonly known as:  PROVENTIL Take 3 mLs (2.5 mg total) by nebulization every 2 (two) hours as needed for wheezing.   amantadine 100 MG capsule Commonly known as:  SYMMETREL Take 100 mg by mouth daily.   baclofen 20 MG tablet Commonly known as:  LIORESAL Take 1 tablet (20 mg total) by mouth 3 (three) times daily as needed for muscle spasms.   carvedilol 25 MG tablet Commonly known as:  COREG Take 1 tablet (25 mg total) by mouth 2 (two) times daily with a meal.   CRANBERRY PO Take 100 mg by mouth daily.   DULoxetine 60 MG capsule Commonly known as:  CYMBALTA Take 60 mg by mouth daily.   famotidine 20 MG tablet Commonly known as:  PEPCID Take 1 tablet (20 mg total) by mouth 2 (two) times daily.   gabapentin 100 MG capsule Commonly known as:  NEURONTIN Take 7 capsules (700 mg total) by mouth 2 (two) times daily.   LACTAID PO Take 1 tablet by mouth daily.   latanoprost 0.005 % ophthalmic solution Commonly known as:  XALATAN Place 1 drop into both eyes at bedtime.   magnesium hydroxide 400 MG/5ML suspension Commonly known as:  MILK OF MAGNESIA Take 30 mLs by mouth every 8 (eight) hours as needed for mild constipation or moderate constipation.   Melatonin 3 MG Tabs Give 2 tablets by mouth at bedtime for Insomnia   Menthol (Topical Analgesic) 154 MG Pads Apply to ( L ) shoulder topically every 8 hours as needed for pain.   mirabegron ER 25 MG Tb24 tablet Commonly known as:  MYRBETRIQ Take 50 mg by mouth at bedtime.   ondansetron 4 MG tablet Commonly known as:  ZOFRAN Take 1 tablet (4 mg total) by mouth every 6 (six) hours as needed for nausea.     phentermine 37.5 MG capsule Take 1 capsule (37.5 mg total) by mouth every morning.   polyethylene glycol packet Commonly known as:  MIRALAX / GLYCOLAX Take 17 g by mouth daily as needed for mild constipation.   potassium chloride 20 MEQ packet Commonly known as:  KLOR-CON Take 20 mEq by mouth daily.   SENOKOT S 8.6-50 MG tablet Generic drug:  senna-docusate Take 1 tablet by mouth 2 (two) times daily.   spironolactone 25 MG tablet Commonly known as:  ALDACTONE Take 25 mg by mouth daily. For CHF   SYSTANE BALANCE 0.6 % Soln Generic drug:  Propylene Glycol Place 1 drop into both eyes 2 (two) times daily.   tamsulosin 0.4 MG Caps capsule Commonly known as:  FLOMAX Take 0.4 mg by mouth daily. For incontinence   torsemide 20 MG tablet Commonly known as:  DEMADEX Take 20 mg by mouth daily.   VITAMIN B-12 PO Take 1 tablet by mouth daily.   VITAMIN D-3 PO Take 1,000 Int'l Units/day by mouth daily.   zolpidem 5 MG tablet Commonly known as:  AMBIEN Take 5 mg by mouth at bedtime.       Review of Systems  Constitutional: Negative for malaise/fatigue.  Respiratory: Negative for cough and shortness of breath.   Cardiovascular: Negative for chest pain, palpitations has had some leg swelling but this has moderated  somewhat improved according to patient weights appear to be relatively stable  Gastrointestinal: Negative for heartburn, abdominal pain and constipation.  Musculoskeletal: Negative for myalgias, back pain and joint pain.  Skin: Negative did have some facial flushing earlier today that apparently was of short duration.   Neurological: Negative for dizziness headache or syncopal type episodes has weakness with history of multiple sclerosis.  Psychiatric/Behavioral: The patient is not nervous/anxious  Immunization History  Administered Date(s) Administered  . Influenza,inj,Quad PF,36+ Mos 08/01/2013  . Influenza-Unspecified 08/31/2014, 08/09/2015  . Pneumococcal  Polysaccharide-23 08/01/2013  . Pneumococcal-Unspecified 08/30/2013, 07/29/2014   Pertinent  Health Maintenance Due  Topic Date Due  . PAP SMEAR  02/04/2000  . MAMMOGRAM  11/11/2012  . COLONOSCOPY  11/11/2012  . INFLUENZA VACCINE  06/01/2016 (Originally 01/31/2016)   No flowsheet data found. Functional Status Survey    Vitals:   03/02/16 1550  BP: 105/70  Pulse: 80  Resp: 20  Temp: 97.1 F (36.2 C)  TempSrc: Oral  SpO2: 98%  Weight: 234 lb 6.4 oz (106.3 kg)  Height: 5\' 5"  (1.651 m)   Body mass index is 39.01 kg/m. Physical Exam   Constitutional: no distress.  sitting Comfortably in her wheelchair  Overweight   Neck: Neck supple. No JVD present. No thyromegaly present.  Cardiovascular: Normal rate, regular rhythm and intact distal pulses.   Respiratory: Effort normal and breath sounds normal. No respiratory distress.  GI: Soft. Bowel sounds are normal. She exhibits no distension. There is no tenderness. Abdomen is obese  Musculoskeletal: She exhibits mild edema this is slightly more on the right versus the left and she states this is chronic pedal pulses are palpable bilaterally there is no erythema or tenderness or warmth  Is able to move right  upper extremity Limited movement in her lower extremities  and left upper extremity is not new  Neurological: alert and oriented X 3   .  Skin: Skin is warm and dry. She is not diaphoretic. Did not note any erythema or flushing.  Not note any swelling lipedema or tongue edema  Labs reviewed  02/27/2016.  Sodium 142 potassium 3.7 BUN 17.5 creatinine 1.37.  02/22/2016.  Sodium 143 potassium 3.8 BUN 18.3 creatinine 1.34  02/22/2011.    Recent Labs  07/20/15 0503 07/21/15 0450 07/22/15 0540 07/23/15 0450  11/08/15 0042 11/09/15 0441 11/16/15 01/25/16 1641 03/03/16  NA 141 135 142 142  < > 148* 143 141 142 142  K 3.1* 3.0* 3.2* 4.9  < > 4.3 4.1 3.9 4.2 3.4  CL 108 105 110 109  < > 110 108  --  103  --   CO2 25 23  24 25   < > 28 24  --  23  --   GLUCOSE 107* 296* 118* 135*  < > 130* 112*  --  113*  --   BUN 7 <  5* 5* 7  < > 23* 22* 15 16 19   CREATININE 0.54 0.59 0.64 0.67  < > 1.24* 1.07* 0.8 1.20* 1.3*  CALCIUM 8.6* 8.4* 8.3* 9.1  < > 9.6 9.1  --  9.0  --   MG 1.8 1.7 1.6* 2.2  --   --   --   --   --   --   PHOS 2.5 2.6 2.9  --   --   --   --   --   --   --   < > = values in this interval not displayed.  Recent Labs  11/07/15 1406 11/08/15 0042 11/16/15 01/25/16 1641 03/03/16  AST 22 21 13 12 13   ALT 23 20 13 12 11   ALKPHOS 153* 144* 126* 138* 140*  BILITOT 0.5 0.4  --  <0.2  --   PROT 8.4* 7.8  --  6.8  --   ALBUMIN 4.2 4.0  --  3.8  --     Recent Labs  07/10/15 1124  11/07/15 1406 11/08/15 0042 11/16/15 01/25/16 1641 03/03/16  WBC 6.1  < > 7.5 9.9 8.2 5.3 5.3  NEUTROABS 3.8  --  3.8  --   --  3.0  --   HGB 11.7*  < > 11.6* 11.0* 9.4*  --  10.0*  HCT 36.0  < > 35.6* 33.8* 31* 31.9* 33*  MCV 82.6  < > 82.2 81.8  --  82  --   PLT 276  < > 346 322 249 281 242  < > = values in this interval not displayed. Lab Results  Component Value Date     03/03/2016   Lab Results  Component Value Date   HGBA1C 5.3 01/11/2015   Lab Results  Component Value Date   CHOL 145 05/01/2012   HDL 23 (L) 05/01/2012   LDLCALC 108 (H) 05/01/2012   TRIG 72 05/01/2012   CHOLHDL 6.3 05/01/2012    Significant Diagnostic Results in last 30 days:  No results found.  Assessment/Plan  1. MS: is slowly declining   Has been  followed by  neurology;  Has paraplegia.will continue amantadine 100 mg daily baclofen 20 mg tid prn  for muscle spasms;    She is followed by neurology.  I she also is on Neurontin 700 mg twice a day for pain    2. Hypertension: will continue coreg 25 mg twice daily and will monitor recent blood pressure stable-112/76-105/70  3. Chronic combined systolic and diastolic heart failure: is stable will continue lasix 40 mg daily; with k+ 20 meq daily; will continue aldactone 25 mg  daily; her EF is 60-65%. --Her weights appear to be relatively stable on edema appears to have stabilized but this will have to be watched closely weights monitored closely and notify provider of significant weight gain will order weight t for tomorrow notify  provider of gain greater than 3 pounds clinically she appears stable with no increased shortness of breath or chest pain She appears to have some mild renal insufficiency with a creatinine of 1.37 which is on the higher end of her baseline Will update a metabolic panel next lab day  4. Neurogenic bladder: takes flomax 0.4 mg daily  myrbetriq 25 mg daily   this apparently is relatively stable although still is an issue at times per review of neurology note  July 2017  5. Depression: is presently taking cymbalta 60 mg daily; which also helps with her pain management.  Is taking  ambien 5 mg nightly for sleep and melatonin was recently added patient would like to be on both apparently this is helping in combination  6. Chronic pain: she is presently stable   will continue  neurontin 700 mg twice  daily; takes cymbalta 60 mg daily  ---   9. Constipation: will continue  miralax  daily as needed and senna s twice daily   10. Gerd: will continue pepcid 20 mg twice daily   11. Glaucoma: will continue xalatan to both eyes.  #12 facial flushing again this apparently was transitory and appears to be more of a contact dermatitis issue-will update a TSH however to monitor also will obtain a B12 and vitamin D level since she is on supplementation.     #13 anemia-l Last HCT was 31.9 in late July this is comparable with the May value will update this since it appears to be down somewhat from values   [id number]-of note greater than 40 minutes spent assessing patient-discussing her status with nursing staff-reviewing her chart-reviewing her labs-and coordinating and formulating a plan of care for numerous diagnoses-of note greater than 50% of time  spent coordinating plan of care

## 2016-03-03 DIAGNOSIS — G35 Multiple sclerosis: Secondary | ICD-10-CM | POA: Diagnosis not present

## 2016-03-03 DIAGNOSIS — D519 Vitamin B12 deficiency anemia, unspecified: Secondary | ICD-10-CM | POA: Diagnosis not present

## 2016-03-03 DIAGNOSIS — I10 Essential (primary) hypertension: Secondary | ICD-10-CM | POA: Diagnosis not present

## 2016-03-03 DIAGNOSIS — E039 Hypothyroidism, unspecified: Secondary | ICD-10-CM | POA: Diagnosis not present

## 2016-03-03 LAB — HEPATIC FUNCTION PANEL
ALT: 11 U/L (ref 7–35)
AST: 13 U/L (ref 13–35)
Alkaline Phosphatase: 140 U/L — AB (ref 25–125)
BILIRUBIN, TOTAL: 0.2 mg/dL

## 2016-03-03 LAB — CBC AND DIFFERENTIAL
HEMATOCRIT: 33 % — AB (ref 36–46)
HEMOGLOBIN: 10 g/dL — AB (ref 12.0–16.0)
Platelets: 242 10*3/uL (ref 150–399)
WBC: 5.3 10^3/mL

## 2016-03-03 LAB — BASIC METABOLIC PANEL
BUN: 19 mg/dL (ref 4–21)
Creatinine: 1.3 mg/dL — AB (ref 0.5–1.1)
GLUCOSE: 149 mg/dL
Potassium: 3.4 mmol/L (ref 3.4–5.3)
Sodium: 142 mmol/L (ref 137–147)

## 2016-03-03 LAB — TSH: TSH: 2.87 u[IU]/mL (ref 0.41–5.90)

## 2016-03-07 NOTE — Telephone Encounter (Signed)
Pt called wanting update on appt for Ocreavus Pt transferred to [facility].

## 2016-03-07 NOTE — Telephone Encounter (Signed)
Message printed and given to Shannon Garrett in the infusion suite.  Tina sts. she will call her with an update/fim

## 2016-03-14 NOTE — Telephone Encounter (Signed)
Heather/Briova [phone] called to advise the medication needs PA. Please call [facility] @ [phone]. Please call Nira Conn after speaking with the insurance company.

## 2016-03-14 NOTE — Telephone Encounter (Signed)
Message printed and given to Tina in the infusion suite/fim 

## 2016-03-21 DIAGNOSIS — R531 Weakness: Secondary | ICD-10-CM | POA: Diagnosis not present

## 2016-03-22 ENCOUNTER — Non-Acute Institutional Stay (SKILLED_NURSING_FACILITY): Payer: Medicare Other | Admitting: Internal Medicine

## 2016-03-22 ENCOUNTER — Encounter: Payer: Self-pay | Admitting: Internal Medicine

## 2016-03-22 DIAGNOSIS — R339 Retention of urine, unspecified: Secondary | ICD-10-CM

## 2016-03-22 DIAGNOSIS — I5042 Chronic combined systolic (congestive) and diastolic (congestive) heart failure: Secondary | ICD-10-CM | POA: Diagnosis not present

## 2016-03-22 DIAGNOSIS — L853 Xerosis cutis: Secondary | ICD-10-CM

## 2016-03-22 DIAGNOSIS — E876 Hypokalemia: Secondary | ICD-10-CM | POA: Diagnosis not present

## 2016-03-22 LAB — VITAMIN B12

## 2016-03-22 NOTE — Progress Notes (Signed)
Patient ID: Shannon Garrett, female   DOB: 12-30-1960, 55 y.o.   MRN: [id number]   Location:  [place] Room Number: 230-B Place of Service:  SNF (31) Provider:  Granville Lewis, PA-C  Gildardo Cranker, DO  Patient Care Team: Gildardo Cranker, DO as PCP - General (Internal Medicine) Gerlene Fee, NP as Nurse Practitioner (Nurse Practitioner)  Extended Emergency Contact Information Primary Emergency Contact: [place], [place] [address] [place] Phone: [phone] Relation: Sister Secondary Emergency Contact: Jefm Bryant,  [place] Phone: [phone] Mobile Phone: [phone] Relation: Friend  Code Status:  DNR Goals of care: Advanced Directive information Advanced Directives 03/22/2016  Does patient have an advance directive? Yes  Type of Advance Directive Out of facility DNR (pink MOST or yellow form)  Does patient want to make changes to advanced directive? No - Patient declined  Copy of advanced directive(s) in chart? Yes  Would patient like information on creating an advanced directive? -  Pre-existing out of facility DNR order (yellow form or pink MOST form) -     Chief Complaint  Patient presents with  . Acute Visit    Dry Skin   Also feels that time she is retaining urine HPI:  Pt is a 55 y.o. female seen today for an acute visit for   Complaints of dry skin on her face.  She also states at times she thinks she is retaining urine although she says she is urinating fairly decent volumes here does not complain of any burning.  She does have a history of multiple sclerosis she is followed by neurology continues on Myrbetriq as well as Flomax  Patient also feels she is having some weight gain she appears to have variable weights today was 238.8-if scales  are accurate was 234.8 yesterday-back on September 16 was 236-back on September 11 was 240 there does not  appear to be in a consistent trend here  She does have a history CHF is on Demadex 20 mg a day Aldactone 25 mg a day.  She does not complain of any shortness of breath or chest pain Past Medical History:  Diagnosis Date  . Cardiomyopathy ([place])    a.  Echo 04/29/12: Mild LVH, EF 20-25%, mild AI, moderate MR, moderate LAE, mild RAE, mild RVE, moderate TR, PASP 51, small pericardial effusion;   b. probably non-ischemic given multiple chemo-Tx agents used for MS and global LV dysfn on echo  . Chronic systolic heart failure ([place])   . Depression   . Glaucoma   . Hypertension   . MS (multiple sclerosis) ([place])    a. Dx'd late 20's. b. Tx with Novantrone, Tysabri, Copaxone previously.   Past Surgical History:  Procedure Laterality Date  . ABLATION     uterine  . CESAREAN SECTION      Allergies  Allergen Reactions  . Sulfonamide Derivatives Hives and Shortness Of Breath  . Penicillins Hives and Itching    Has patient had a PCN reaction causing immediate rash, facial/tongue/throat swelling, SOB or lightheadedness with hypotension: unknown Has patient had a PCN reaction causing severe rash involving mucus membranes or skin necrosis: unknown Has patient had a PCN reaction that required hospitalization: unknown Has patient had a PCN reaction occurring within the last 10 years: unknown If all of the above answers are "NO", then may proceed with Cephalosporin  use. Prior course of rocephin charted 05/2015      Medication List       Accurate as of 03/22/16  1:19 PM. Always use your most recent med list.          acetaminophen 325 MG tablet Commonly known as:  TYLENOL Take 2 tablets (650 mg total) by mouth every 6 (six) hours as needed for mild pain or fever (or Fever >/= 101).   albuterol (2.5 MG/3ML) 0.083% nebulizer solution Commonly known as:  PROVENTIL Take 3 mLs (2.5 mg total) by nebulization every 2 (two) hours as needed for wheezing.   amantadine 100 MG capsule Commonly known  as:  SYMMETREL Take 100 mg by mouth daily.   baclofen 20 MG tablet Commonly known as:  LIORESAL Take 1 tablet (20 mg total) by mouth 3 (three) times daily as needed for muscle spasms.   carvedilol 25 MG tablet Commonly known as:  COREG Take 1 tablet (25 mg total) by mouth 2 (two) times daily with a meal.   CRANBERRY PO Take 100 mg by mouth daily.   DULoxetine 60 MG capsule Commonly known as:  CYMBALTA Take 60 mg by mouth daily.   famotidine 20 MG tablet Commonly known as:  PEPCID Take 1 tablet (20 mg total) by mouth 2 (two) times daily.   gabapentin 100 MG capsule Commonly known as:  NEURONTIN Take 7 capsules (700 mg total) by mouth 2 (two) times daily.   LACTAID PO Take 1 tablet by mouth daily.   Lactulose 20 GM/30ML Soln Take 30 mLs by mouth daily.   latanoprost 0.005 % ophthalmic solution Commonly known as:  XALATAN Place 1 drop into both eyes at bedtime.   magnesium hydroxide 400 MG/5ML suspension Commonly known as:  MILK OF MAGNESIA Take 30 mLs by mouth every 8 (eight) hours as needed for mild constipation or moderate constipation.   Melatonin 3 MG Tabs Give 2 tablets by mouth at bedtime for Insomnia   Menthol (Topical Analgesic) 154 MG Pads Apply to ( L ) shoulder topically every 8 hours as needed for pain.   mirabegron ER 25 MG Tb24 tablet Commonly known as:  MYRBETRIQ Take 50 mg by mouth at bedtime.   ondansetron 4 MG tablet Commonly known as:  ZOFRAN Take 1 tablet (4 mg total) by mouth every 6 (six) hours as needed for nausea.   phentermine 37.5 MG capsule Take 1 capsule (37.5 mg total) by mouth every morning.   polyethylene glycol packet Commonly known as:  MIRALAX / GLYCOLAX Take 17 g by mouth daily as needed for mild constipation.   potassium chloride 20 MEQ packet Commonly known as:  KLOR-CON Take 20 mEq by mouth daily.   SENOKOT S 8.6-50 MG tablet Generic drug:  senna-docusate Take 1 tablet by mouth 2 (two) times daily.     spironolactone 25 MG tablet Commonly known as:  ALDACTONE Take 25 mg by mouth daily. For CHF   SYSTANE BALANCE 0.6 % Soln Generic drug:  Propylene Glycol Place 1 drop into both eyes 2 (two) times daily.   tamsulosin 0.4 MG Caps capsule Commonly known as:  FLOMAX Take 0.4 mg by mouth daily. For incontinence   torsemide 20 MG tablet Commonly known as:  DEMADEX Take 20 mg by mouth daily.   VITAMIN D-3 PO Take 1,000 Int'l Units/day by mouth daily.   zolpidem 5 MG tablet Commonly known as:  AMBIEN Take 5 mg by mouth at bedtime.       Review  of Systems  Constitutional: Negative for malaise/fatigue.  Respiratory: Negative for cough and shortness of breath.  Cardiovascular: Negative for chest pain, palpitations has had some leg swelling but this does not appear grossly changed from baseline-- patient weights appear to be relatively stable  Gastrointestinal: Negative for heartburn, abdominal pain and constipation.  Musculoskeletal: Negative for myalgias, back pain and joint pain.  Skin:  Complains of dry skin on her face cheeks Neurological: Negative for dizziness headache or syncopal type episodes has weakness with history of multiple sclerosis.  Psychiatric/Behavioral: The patient is not nervous/anxious   Immunization History  Administered Date(s) Administered  . Influenza,inj,Quad PF,36+ Mos 08/01/2013  . Influenza-Unspecified 08/31/2014, 08/09/2015  . Pneumococcal Polysaccharide-23 08/01/2013  . Pneumococcal-Unspecified 08/30/2013, 07/29/2014   Pertinent  Health Maintenance Due  Topic Date Due  . PAP SMEAR  02/04/2000  . MAMMOGRAM  11/11/2012  . COLONOSCOPY  11/11/2012  . INFLUENZA VACCINE  06/01/2016 (Originally 01/31/2016)   No flowsheet data found. Functional Status Survey:    Vitals:   03/22/16 1305  BP: 113/70  Pulse: 66  Resp: 18  Temp: 97.3 F (36.3 C)  TempSrc: Oral  SpO2: 98%  Weight: 234 lb 8 oz (106.4 kg)  Height: 5\' 5"  (1.651 m)  Again  weight today on September 21 238.8 Body mass index is 39.02 kg/m. Physical Exam Constitutional: . No distress lying comfortably in bed Overweight  Neck: Neck supple. No JVD present. No thyromegaly present.  Cardiovascular: Normal rate, regular rhythm and intact distal pulses. She has trace lower extremity edema bilaterally Respiratory: Effort normal and breath sounds normal. No respiratory distress.  GI: Soft. Bowel sounds are normal. She exhibits no distension. There is no tenderness. Abdomen is obese GU-cannot really appreciate any significant suprapubic distention or tenderness to palpation  Musculoskeletal: She exhibits mild edema this is slightly more on the right versus the left and she states this is chronic pedal pulses are palpable bilaterally there is no erythema or tenderness or warmth  Is able to move right upper extremity Limited movement in her lower extremities and left upper extremity is not new  Neurological: alert and oriented X 3 .  Skin: Skin is warm and dry. She is not diaphoretic. Did not note any erythema or flushing. Does complain of dryness of her cheeks  Not note any swelling lipedema or tongue edema Labs reviewed:  Recent Labs  07/20/15 0503 07/21/15 0450 07/22/15 0540 07/23/15 0450  11/08/15 0042 11/09/15 0441 11/16/15 01/25/16 1641 03/03/16  NA 141 135 142 142  < > 148* 143 141 142 142  K 3.1* 3.0* 3.2* 4.9  < > 4.3 4.1 3.9 4.2 3.4  CL 108 105 110 109  < > 110 108  --  103  --   CO2 25 23 24 25   < > 28 24  --  23  --   GLUCOSE 107* 296* 118* 135*  < > 130* 112*  --  113*  --   BUN 7 <5* 5* 7  < > 23* 22* 15 16 19   CREATININE 0.54 0.59 0.64 0.67  < > 1.24* 1.07* 0.8 1.20* 1.3*  CALCIUM 8.6* 8.4* 8.3* 9.1  < > 9.6 9.1  --  9.0  --   MG 1.8 1.7 1.6* 2.2  --   --   --   --   --   --   PHOS 2.5 2.6 2.9  --   --   --   --   --   --   --   < > =  values in this interval not displayed.  Recent Labs  11/07/15 1406 11/08/15 0042 11/16/15  01/25/16 1641 03/03/16  AST 22 21 13 12 13   ALT 23 20 13 12 11   ALKPHOS 153* 144* 126* 138* 140*  BILITOT 0.5 0.4  --  <0.2  --   PROT 8.4* 7.8  --  6.8  --   ALBUMIN 4.2 4.0  --  3.8  --     Recent Labs  07/10/15 1124  11/07/15 1406 11/08/15 0042 11/16/15 01/25/16 1641 03/03/16  WBC 6.1  < > 7.5 9.9 8.2 5.3 5.3  NEUTROABS 3.8  --  3.8  --   --  3.0  --   HGB 11.7*  < > 11.6* 11.0* 9.4*  --  10.0*  HCT 36.0  < > 35.6* 33.8* 31* 31.9* 33*  MCV 82.6  < > 82.2 81.8  --  82  --   PLT 276  < > 346 322 249 281 242  < > = values in this interval not displayed. Lab Results  Component Value Date   TSH 2.87 03/03/2016   Lab Results  Component Value Date   HGBA1C 5.3 01/11/2015   Lab Results  Component Value Date   CHOL 145 05/01/2012   HDL 23 (L) 05/01/2012   LDLCALC 108 (H) 05/01/2012   TRIG 72 05/01/2012   CHOLHDL 6.3 05/01/2012    Significant Diagnostic Results in last 30 days:  No results found.  Assessment/Plan T #1-history of dry skin shakes will order hydrocortisone 0.5% cream apply twice a day when necessary to cheeks and monitor avoid eyes.  #2 history of CHF she is again on Demadex as well as Aldactone as well as a beta blocker this appears to be stable clinically she has trace lower extremity edema weights appear to be relatively stable although variable-I do not see any consiistent trend up but this will have to be watched I also note her potassium was minimally low at 3.4 on lab done September 2-will update a BMP to assure stability here.--She is on potassium supplementation  #3 complaints of feeling that possibly she is retaining urine although she states she is urinating quite well-will order an in and out cath notify provider if greater than 200 mL obtained she is not complaining of any burning or dysuria but this will have to be watched as well a  [id number]

## 2016-03-23 DIAGNOSIS — I10 Essential (primary) hypertension: Secondary | ICD-10-CM | POA: Diagnosis not present

## 2016-03-23 LAB — BASIC METABOLIC PANEL
BUN: 19 mg/dL (ref 4–21)
CREATININE: 1.4 mg/dL — AB (ref 0.5–1.1)
GLUCOSE: 94 mg/dL
POTASSIUM: 4.1 mmol/L (ref 3.4–5.3)
Sodium: 142 mmol/L (ref 137–147)

## 2016-03-28 DIAGNOSIS — G35 Multiple sclerosis: Secondary | ICD-10-CM | POA: Diagnosis not present

## 2016-03-28 DIAGNOSIS — R1311 Dysphagia, oral phase: Secondary | ICD-10-CM | POA: Diagnosis not present

## 2016-03-29 DIAGNOSIS — R1311 Dysphagia, oral phase: Secondary | ICD-10-CM | POA: Diagnosis not present

## 2016-03-29 DIAGNOSIS — G35 Multiple sclerosis: Secondary | ICD-10-CM | POA: Diagnosis not present

## 2016-03-30 DIAGNOSIS — R1311 Dysphagia, oral phase: Secondary | ICD-10-CM | POA: Diagnosis not present

## 2016-03-30 DIAGNOSIS — G35 Multiple sclerosis: Secondary | ICD-10-CM | POA: Diagnosis not present

## 2016-04-02 DIAGNOSIS — R1311 Dysphagia, oral phase: Secondary | ICD-10-CM | POA: Diagnosis not present

## 2016-04-02 DIAGNOSIS — G35 Multiple sclerosis: Secondary | ICD-10-CM | POA: Diagnosis not present

## 2016-04-03 DIAGNOSIS — R1311 Dysphagia, oral phase: Secondary | ICD-10-CM | POA: Diagnosis not present

## 2016-04-03 DIAGNOSIS — G35 Multiple sclerosis: Secondary | ICD-10-CM | POA: Diagnosis not present

## 2016-04-04 DIAGNOSIS — G35 Multiple sclerosis: Secondary | ICD-10-CM | POA: Diagnosis not present

## 2016-04-04 DIAGNOSIS — R1311 Dysphagia, oral phase: Secondary | ICD-10-CM | POA: Diagnosis not present

## 2016-04-05 DIAGNOSIS — R1311 Dysphagia, oral phase: Secondary | ICD-10-CM | POA: Diagnosis not present

## 2016-04-05 DIAGNOSIS — G35 Multiple sclerosis: Secondary | ICD-10-CM | POA: Diagnosis not present

## 2016-04-06 DIAGNOSIS — R1311 Dysphagia, oral phase: Secondary | ICD-10-CM | POA: Diagnosis not present

## 2016-04-06 DIAGNOSIS — G35 Multiple sclerosis: Secondary | ICD-10-CM | POA: Diagnosis not present

## 2016-04-09 DIAGNOSIS — G35 Multiple sclerosis: Secondary | ICD-10-CM | POA: Diagnosis not present

## 2016-04-09 DIAGNOSIS — R1311 Dysphagia, oral phase: Secondary | ICD-10-CM | POA: Diagnosis not present

## 2016-04-10 ENCOUNTER — Non-Acute Institutional Stay (SKILLED_NURSING_FACILITY): Payer: Medicare Other | Admitting: Adult Health

## 2016-04-10 ENCOUNTER — Encounter: Payer: Self-pay | Admitting: Adult Health

## 2016-04-10 DIAGNOSIS — I110 Hypertensive heart disease with heart failure: Secondary | ICD-10-CM

## 2016-04-10 DIAGNOSIS — I5042 Chronic combined systolic (congestive) and diastolic (congestive) heart failure: Secondary | ICD-10-CM

## 2016-04-10 DIAGNOSIS — K219 Gastro-esophageal reflux disease without esophagitis: Secondary | ICD-10-CM

## 2016-04-10 DIAGNOSIS — N319 Neuromuscular dysfunction of bladder, unspecified: Secondary | ICD-10-CM

## 2016-04-10 DIAGNOSIS — Z6839 Body mass index (BMI) 39.0-39.9, adult: Secondary | ICD-10-CM

## 2016-04-10 DIAGNOSIS — E6609 Other obesity due to excess calories: Secondary | ICD-10-CM | POA: Diagnosis not present

## 2016-04-10 DIAGNOSIS — G35 Multiple sclerosis: Secondary | ICD-10-CM

## 2016-04-10 DIAGNOSIS — IMO0001 Reserved for inherently not codable concepts without codable children: Secondary | ICD-10-CM

## 2016-04-10 DIAGNOSIS — R1311 Dysphagia, oral phase: Secondary | ICD-10-CM | POA: Diagnosis not present

## 2016-04-10 NOTE — Progress Notes (Signed)
Patient ID: Shannon Garrett, female   DOB: [birth year], 55 y.o.   MRN: [id number]   Location:   [place] Room Number: 230-A Place of Service:  SNF (31)   CODE STATUS: DNR  Allergies  Allergen Reactions  . Sulfonamide Derivatives Hives and Shortness Of Breath  . Penicillins Hives and Itching    Has patient had a PCN reaction causing immediate rash, facial/tongue/throat swelling, SOB or lightheadedness with hypotension: unknown Has patient had a PCN reaction causing severe rash involving mucus membranes or skin necrosis: unknown Has patient had a PCN reaction that required hospitalization: unknown Has patient had a PCN reaction occurring within the last 10 years: unknown If all of the above answers are "NO", then may proceed with Cephalosporin use. Prior course of rocephin charted 05/2015    Chief Complaint  Patient presents with  . Medical Management of Chronic Issues    Follow up    HPI:  She is a long term resident of this facility being seen for the management of her chronic illnesses. She has been on adipex to help her lose weight without success. She is having issues with depression and insomnia. There are no nursing concerns at this time. She does get out of bed for activities.    Past Medical History:  Diagnosis Date  . Cardiomyopathy ([place])    a.  Echo 04/29/12: Mild LVH, EF 20-25%, mild AI, moderate MR, moderate LAE, mild RAE, mild RVE, moderate TR, PASP 51, small pericardial effusion;   b. probably non-ischemic given multiple chemo-Tx agents used for MS and global LV dysfn on echo  . Chronic systolic heart failure ([place])   . Depression   . Glaucoma   . Hypertension   . MS (multiple sclerosis) ([place])    a. Dx'd late 20's. b. Tx with Novantrone, Tysabri, Copaxone previously.    Past Surgical History:  Procedure Laterality Date  . ABLATION     uterine  . CESAREAN SECTION      Social History   Social History  . Marital status: Single    Spouse name:  N/A  . Number of children: N/A  . Years of education: N/A   Occupational History  . Not on file.   Social History Main Topics  . Smoking status: Former Research scientist (life sciences)  . Smokeless tobacco: Never Used  . Alcohol use No  . Drug use: No  . Sexual activity: Not Currently   Other Topics Concern  . Not on file   Social History Narrative  . No narrative on file   Family History  Problem Relation Age of Onset  . Hypertension Mother   . Cancer Mother     breast   . Cancer Father     prostate  . Multiple sclerosis Sister   . Heart attack Neg Hx       VITAL SIGNS BP 128/78   Pulse 82   Temp 97.1 F (36.2 C) (Oral)   Resp 18   Ht 5\' 5"  (1.651 m)   Wt 237 lb (107.5 kg)   SpO2 99%   BMI 39.44 kg/m   Patient's Medications  New Prescriptions   No medications on file  Previous Medications   ACETAMINOPHEN (TYLENOL) 325 MG TABLET    Take 2 tablets (650 mg total) by mouth every 6 (six) hours as needed for mild pain or fever (or Fever >/= 101).   ALBUTEROL (PROVENTIL) (2.5 MG/3ML) 0.083% NEBULIZER SOLUTION    Take 3 mLs (2.5 mg total) by nebulization  every 2 (two) hours as needed for wheezing.   AMANTADINE (SYMMETREL) 100 MG CAPSULE    Take 100 mg by mouth daily.    BACLOFEN (LIORESAL) 20 MG TABLET    Take 1 tablet (20 mg total) by mouth 3 (three) times daily as needed for muscle spasms.   CARVEDILOL (COREG) 25 MG TABLET    Take 1 tablet (25 mg total) by mouth 2 (two) times daily with a meal.   CHOLECALCIFEROL (VITAMIN D-3 PO)    Take 1,000 Int'l Units/day by mouth daily.    CRANBERRY PO    Take 100 mg by mouth daily.   DULOXETINE (CYMBALTA) 60 MG CAPSULE    Take 60 mg by mouth daily.   FAMOTIDINE (PEPCID) 20 MG TABLET    Take 1 tablet (20 mg total) by mouth 2 (two) times daily.   GABAPENTIN (NEURONTIN) 100 MG CAPSULE    Take 7 capsules (700 mg total) by mouth 2 (two) times daily.   HYDROCORTISONE CREAM 0.5 %    Apply 1 application topically 2 (two) times daily.   LACTASE (LACTAID PO)     Take 1 tablet by mouth daily.   LACTULOSE 20 GM/30ML SOLN    Take 30 mLs by mouth daily.   LATANOPROST (XALATAN) 0.005 % OPHTHALMIC SOLUTION    Place 1 drop into both eyes at bedtime.   MAGNESIUM HYDROXIDE (MILK OF MAGNESIA) 400 MG/5ML SUSPENSION    Take 30 mLs by mouth every 8 (eight) hours as needed for mild constipation or moderate constipation.   MELATONIN 3 MG TABS    Give 2 tablets by mouth at bedtime for Insomnia   MENTHOL, TOPICAL ANALGESIC, 154 MG PADS    Apply to ( L ) shoulder topically every 8 hours as needed for pain.   MIRABEGRON ER (MYRBETRIQ) 25 MG TB24 TABLET    Take 50 mg by mouth at bedtime.    ONDANSETRON (ZOFRAN) 4 MG TABLET    Take 1 tablet (4 mg total) by mouth every 6 (six) hours as needed for nausea.   PHENTERMINE 37.5 MG CAPSULE    Take 1 capsule (37.5 mg total) by mouth every morning.   POLYETHYLENE GLYCOL (MIRALAX / GLYCOLAX) PACKET    Take 17 g by mouth daily as needed for mild constipation.   POTASSIUM CHLORIDE (KLOR-CON) 20 MEQ PACKET    Take 20 mEq by mouth daily.    PROPYLENE GLYCOL (SYSTANE BALANCE) 0.6 % SOLN    Place 1 drop into both eyes 2 (two) times daily.   SENNA-DOCUSATE (SENOKOT S) 8.6-50 MG TABLET    Take 1 tablet by mouth 2 (two) times daily.   SPIRONOLACTONE (ALDACTONE) 25 MG TABLET    Take 25 mg by mouth daily. For CHF   TAMSULOSIN (FLOMAX) 0.4 MG CAPS CAPSULE    Take 0.4 mg by mouth daily. For incontinence   TORSEMIDE (DEMADEX) 20 MG TABLET    Take 20 mg by mouth daily.   ZOLPIDEM (AMBIEN) 5 MG TABLET    Take 5 mg by mouth at bedtime.  Modified Medications   No medications on file  Discontinued Medications   No medications on file     SIGNIFICANT DIAGNOSTIC EXAMS  10-08-14: MRI brain: No significant change in diffuse white matter lesions consistent with patient's history of multiple sclerosis. None of these areas demonstrate abnormal restriction or enhancement as may be seen with areas of acute demyelination. Global atrophy. Decrease in size  right pituitary mass now measuring 1.2 x 0.5 x  0.9 cm versus prior 1.2 x 0.7 x 1.1 cm. Right parotid 1 cm lesion does not have typical appearance of an intra parotid lymph node and therefore primary parotid lesion is a possibility. ENT consultation may be considered for further delineation.  10-08-14: mri of cervical; spine: Scattered new areas of altered signal intensity (compared to 2007) throughout the cervical cord as detailed above consistent with patient's history of multiple sclerosis. None of these areas demonstrate enhancement as may be seen with areas of active demyelination. Cervical spondylotic changes as detailed above most prominent C5-6 level.  12-13-14: ABI: right: mild atherosclerotic disease   01-10-15: ct of head: No acute intracranial abnormalities. Mild atrophy. White matter disease compatible with history of MS.  01-10-15: chest x-ray: No active disease  01-12-15: 2-d echo: Left ventricle: The cavity size was normal. There was mild concentric hypertrophy. Systolic function was normal. The estimated ejection fraction was in the range of 60% to 65%. Wall motion was normal; there were no regional wall motion abnormalities. Doppler parameters are consistent with abnormal left ventricular relaxation (grade 1 diastolic dysfunction). - Aortic valve: There was mild regurgitation. - Mitral valve: Calcified annulus. There was mild regurgitation.  05-30-15: chest x-ray: No active disease.  07-14-15: EEG: This EEG is consistent with a mild generalized non-specific cerebral dysfunction(encephalopathy).   07-20-15: chest x-ray; Interval extubation. Decreasing lung volumes with increasing bibasilar opacities, likely atelectasis.  11-07-15: ct of head; No acute intracranial abnormality. White matter changes are suggestive for chronic small vessel ischemic disease. Right maxillary sinus disease.     LABS REVIEWED:   04-18-15: enterococcus faecalis: macrobid  05-30-15: urine culture:  multiple bacteria  06-02-15: wbc 12.2; hgb 10.4; hct 32.0; mcv 84.7; plt 257; glucose 119; bun 18; creat 0.99; k+ 3.5; na++150; liver normal albumin 3.3  07-10-15: tsh 0.557; vit B12: 2449 07-23-15: wbc 7.9; hgb 8.7; hct 25.5; mcv 79.4; plt 304; glucose 135; bun 7; creat 0.67; k+ 4.9; na++142; mag 2.2  08-19-15: wbc 9.9; hgb 10.6; hct 33.2; mcv 81.8; plt 400 glucose 105; bun 11.0; creat 1.13; k+ 4.1; na++141; liver normal albumin 3.7  urine culture: proteus mirabilis: cipro  11-16-15: wbc 8.2; hgb 9.4; hct 30.9; mcv 82.5; plt 249 glucose 89; bun 15.0; creat 0.75; k+ 3.9; na++ 141; liver normal albumin 3.5  03-03-16: wbc 5.3; hgb 10.0; hct 32.6; mcv 81.9; plt 242; glucose 149; bun 15.2; creat 1.26; k+ 3.4; na++ 143; ast 140; albumin 3.9; vit B 12: >2000 03-23-16: glucose 94; bun 18.5; creat 1.42; k+ 4.1; na++ 142     Review of Systems  Constitutional: Negative for malaise/fatigue.  Respiratory: Negative for cough and shortness of breath.   Cardiovascular: Negative for chest pain, palpitations and leg swelling.  Gastrointestinal: Negative for heartburn, abdominal pain and constipation.  Musculoskeletal: Negative for myalgias, back pain and joint pain.  Skin: Negative.   Neurological: Negative for dizziness.  Psychiatric/Behavioral: does have depression and insomnia.      Physical Exam Constitutional: . No distress.  Obese   Neck: Neck supple. No JVD present. No thyromegaly present.  Cardiovascular: Normal rate, regular rhythm and intact distal pulses.   Respiratory: Effort normal and breath sounds normal. No respiratory distress.  GI: Soft. Bowel sounds are normal. She exhibits no distension. There is no tenderness.  Musculoskeletal: She exhibits no edema.  Is able to move right  upper extremity Limited movement in her lower extremities  and left upper extremity  Neurological: alert and oriented X 3   .  Skin: Skin is warm and dry. She is not diaphoretic.    ASSESSMENT/ PLAN: 1. MS: is  slowly declining   Has been  followed by  neurology;  Has paraplegia.will continue amantadine 100 mg daily baclofen 20 mg tid prn  for muscle spasms;      2. Hypertension: will continue coreg 25 mg twice daily and will monitor   3. Chronic combined systolic and diastolic heart failure: is stable will continue demadex 20  mg daily; with k+ 20 meq daily; will continue aldactone 25 mg daily; her EF is 60-65%.   4. Neurogenic bladder: takes flomax 0.4 mg daily  myrbetriq 50 mg daily    5. Depression: is presently taking cymbalta 60 mg daily; which also helps with her pain management.  Is taking ambien 5 mg nightly for sleep and melatonin 6 mg nightly   6. Chronic pain: she is presently stable   will continue  neurontin 700 mg twice  daily; takes cymbalta 60 mg daily    9. Constipation: will continue  miralax  daily as needed and senna s twice daily   10. Gerd: will continue pepcid 20 mg twice daily   11. Glaucoma: will continue xalatan to both eyes.    12. Obesity: will stop the adipex as this medication has provided any benefit; will monitor   Will have her see psych services for depression and insomnia     MD is aware of resident's narcotic use and is in agreement with current plan of care. We will attempt to wean resident as appropriate.      Ok Edwards NP [facility]  Contact [phone] Monday through Friday 8am- 5pm  After hours call [phone]

## 2016-04-11 DIAGNOSIS — R1311 Dysphagia, oral phase: Secondary | ICD-10-CM | POA: Diagnosis not present

## 2016-04-11 DIAGNOSIS — G35 Multiple sclerosis: Secondary | ICD-10-CM | POA: Diagnosis not present

## 2016-04-12 DIAGNOSIS — R1311 Dysphagia, oral phase: Secondary | ICD-10-CM | POA: Diagnosis not present

## 2016-04-12 DIAGNOSIS — G35 Multiple sclerosis: Secondary | ICD-10-CM | POA: Diagnosis not present

## 2016-04-13 DIAGNOSIS — R1311 Dysphagia, oral phase: Secondary | ICD-10-CM | POA: Diagnosis not present

## 2016-04-13 DIAGNOSIS — G35 Multiple sclerosis: Secondary | ICD-10-CM | POA: Diagnosis not present

## 2016-04-16 DIAGNOSIS — G35 Multiple sclerosis: Secondary | ICD-10-CM | POA: Diagnosis not present

## 2016-04-16 DIAGNOSIS — R1311 Dysphagia, oral phase: Secondary | ICD-10-CM | POA: Diagnosis not present

## 2016-04-17 DIAGNOSIS — R1311 Dysphagia, oral phase: Secondary | ICD-10-CM | POA: Diagnosis not present

## 2016-04-17 DIAGNOSIS — G35 Multiple sclerosis: Secondary | ICD-10-CM | POA: Diagnosis not present

## 2016-04-18 DIAGNOSIS — G35 Multiple sclerosis: Secondary | ICD-10-CM | POA: Diagnosis not present

## 2016-04-18 DIAGNOSIS — R1311 Dysphagia, oral phase: Secondary | ICD-10-CM | POA: Diagnosis not present

## 2016-04-19 DIAGNOSIS — G35 Multiple sclerosis: Secondary | ICD-10-CM | POA: Diagnosis not present

## 2016-04-19 DIAGNOSIS — R1311 Dysphagia, oral phase: Secondary | ICD-10-CM | POA: Diagnosis not present

## 2016-04-20 DIAGNOSIS — G35 Multiple sclerosis: Secondary | ICD-10-CM | POA: Diagnosis not present

## 2016-04-20 DIAGNOSIS — R1311 Dysphagia, oral phase: Secondary | ICD-10-CM | POA: Diagnosis not present

## 2016-04-23 DIAGNOSIS — G35 Multiple sclerosis: Secondary | ICD-10-CM | POA: Diagnosis not present

## 2016-04-23 DIAGNOSIS — R1311 Dysphagia, oral phase: Secondary | ICD-10-CM | POA: Diagnosis not present

## 2016-04-24 DIAGNOSIS — G35 Multiple sclerosis: Secondary | ICD-10-CM | POA: Diagnosis not present

## 2016-04-24 DIAGNOSIS — R1311 Dysphagia, oral phase: Secondary | ICD-10-CM | POA: Diagnosis not present

## 2016-04-25 DIAGNOSIS — G35 Multiple sclerosis: Secondary | ICD-10-CM | POA: Diagnosis not present

## 2016-04-25 DIAGNOSIS — R1311 Dysphagia, oral phase: Secondary | ICD-10-CM | POA: Diagnosis not present

## 2016-04-26 DIAGNOSIS — G35 Multiple sclerosis: Secondary | ICD-10-CM | POA: Diagnosis not present

## 2016-04-26 DIAGNOSIS — R1311 Dysphagia, oral phase: Secondary | ICD-10-CM | POA: Diagnosis not present

## 2016-04-27 DIAGNOSIS — G35 Multiple sclerosis: Secondary | ICD-10-CM | POA: Diagnosis not present

## 2016-04-27 DIAGNOSIS — R1311 Dysphagia, oral phase: Secondary | ICD-10-CM | POA: Diagnosis not present

## 2016-04-29 DIAGNOSIS — G35 Multiple sclerosis: Secondary | ICD-10-CM | POA: Diagnosis not present

## 2016-04-29 DIAGNOSIS — R1311 Dysphagia, oral phase: Secondary | ICD-10-CM | POA: Diagnosis not present

## 2016-05-01 DIAGNOSIS — G35 Multiple sclerosis: Secondary | ICD-10-CM | POA: Diagnosis not present

## 2016-05-02 DIAGNOSIS — R1311 Dysphagia, oral phase: Secondary | ICD-10-CM | POA: Diagnosis not present

## 2016-05-02 DIAGNOSIS — G35 Multiple sclerosis: Secondary | ICD-10-CM | POA: Diagnosis not present

## 2016-05-03 DIAGNOSIS — G35 Multiple sclerosis: Secondary | ICD-10-CM | POA: Diagnosis not present

## 2016-05-03 DIAGNOSIS — R1311 Dysphagia, oral phase: Secondary | ICD-10-CM | POA: Diagnosis not present

## 2016-05-07 DIAGNOSIS — G35 Multiple sclerosis: Secondary | ICD-10-CM | POA: Diagnosis not present

## 2016-05-07 DIAGNOSIS — R1311 Dysphagia, oral phase: Secondary | ICD-10-CM | POA: Diagnosis not present

## 2016-05-08 ENCOUNTER — Non-Acute Institutional Stay (SKILLED_NURSING_FACILITY): Payer: Medicare Other | Admitting: Internal Medicine

## 2016-05-08 DIAGNOSIS — N319 Neuromuscular dysfunction of bladder, unspecified: Secondary | ICD-10-CM | POA: Diagnosis not present

## 2016-05-08 DIAGNOSIS — I10 Essential (primary) hypertension: Secondary | ICD-10-CM | POA: Diagnosis not present

## 2016-05-08 DIAGNOSIS — G35 Multiple sclerosis: Secondary | ICD-10-CM | POA: Diagnosis not present

## 2016-05-08 DIAGNOSIS — I5042 Chronic combined systolic (congestive) and diastolic (congestive) heart failure: Secondary | ICD-10-CM

## 2016-05-08 NOTE — Progress Notes (Signed)
This is a routine visit.  Level care skilled.  Facility is Psych[facility], sport and exercise Complaint  Patient presents with  . Medical Management of Chronic Issues    Routine Visit  Medical management of chronic medical conditions including multiple sclerosis-CHF-depression-GERD-neuropathy-neurogenic bladder-    HPI:  Pt is a 55 y.o. female seen today for medical management of chronic diseases. As noted above.    Her medical issues appear to be relatively stable although she appears to continue to have  some decline with her multiple sclerosis she is on amantadine as well as baclofen and is followed closely by neurology.  Regards to CHF she is on Demadex 20 mg a day now as well as Aldactone 25 mg a day appears to have moderated appears her weights are relatively stable most recent weight 237.7   she does not complain of any shortness of breath or chest pain.  She also continues on Coreg in addition to the Demadex.  She had been on an appetite suppressant but this was not really effective and has been discontinued-she states the medication was keeping her up at night as well  She also has a history of depression continues on Cymbalta in this appears to be relatively stable.  Regards to urinary frequency at times this is an issue she is on Myrbetriq  as well as Flomax -- neurology is following this as well     Vital signs continue to be stable.         Past Medical History:  Diagnosis Date  . Cardiomyopathy ([place])    a.  Echo 04/29/12: Mild LVH, EF 20-25%, mild AI, moderate MR, moderate LAE, mild RAE, mild RVE, moderate TR, PASP 51, small pericardial effusion;   b. probably non-ischemic given multiple chemo-Tx agents used for MS and global LV dysfn on echo  . Chronic systolic heart failure ([place])   . Depression   . Glaucoma   . Hypertension   . MS (multiple sclerosis) ([place])    a. Dx'd late 20's. b. Tx with Novantrone, Tysabri, Copaxone  previously.        Past Surgical History:  Procedure Laterality Date  . ABLATION     uterine  . CESAREAN SECTION          Allergies  Allergen Reactions  . Sulfonamide Derivatives Hives and Shortness Of Breath  . Penicillins Hives and Itching          Medication List               acetaminophen 325 MG tablet Commonly known as:  TYLENOL Take 2 tablets (650 mg total) by mouth every 6 (six) hours as needed for mild pain or fever (or Fever >/= 101).  albuterol (2.5 MG/3ML) 0.083% nebulizer solution Commonly known as:  PROVENTIL Take 3 mLs (2.5 mg total) by nebulization every 2 (two) hours as needed for wheezing.  amantadine 100 MG capsule Commonly known as:  SYMMETREL Take 100 mg by mouth daily.  baclofen 20 MG tablet Commonly known as:  LIORESAL Take 1 tablet (20 mg total) by mouth 3 (three) times daily as needed for muscle spasms.  carvedilol 25 MG tablet Commonly known as:  COREG Take 1 tablet (25 mg total) by mouth 2 (two) times daily with a meal.  CRANBERRY PO Take 100 mg by mouth daily.  DULoxetine 60 MG capsule Commonly known as:  CYMBALTA Take 60 mg by mouth daily.  famotidine 20 MG tablet Commonly known as:  PEPCID Take 1  tablet (20 mg total) by mouth 2 (two) times daily.  gabapentin 100 MG capsule Commonly known as:  NEURONTIN Take 7 capsules (700 mg total) by mouth 2 (two) times daily.  LACTAID PO Take 1 tablet by mouth daily.  latanoprost 0.005 % ophthalmic solution Commonly known as:  XALATAN Place 1 drop into both eyes at bedtime.  magnesium hydroxide 400 MG/5ML suspension Commonly known as:  MILK OF MAGNESIA Take 30 mLs by mouth every 8 (eight) hours as needed for mild constipation or moderate constipation.  Melatonin 3 MG Tabs Give 2 tablets by mouth at bedtime for Insomnia  Menthol (Topical Analgesic) 154 MG Pads Apply to ( L ) shoulder topically every 8 hours as needed for pain.  mirabegron ER 25 MG Tb24 tablet Commonly known  as:  MYRBETRIQ Take 50 mg by mouth at bedtime.  ondansetron 4 MG tablet Commonly known as:  ZOFRAN Take 1 tablet (4 mg total) by mouth every 6 (six) hours as needed for nausea.     polyethylene glycol packet Commonly known as:  MIRALAX / GLYCOLAX Take 17 g by mouth daily as needed for mild constipation.  potassium chloride 20 MEQ packet Commonly known as:  KLOR-CON Take 20 mEq by mouth daily.  SENOKOT S 8.6-50 MG tablet Generic drug:  senna-docusate Take 1 tablet by mouth 2 (two) times daily.  spironolactone 25 MG tablet Commonly known as:  ALDACTONE Take 25 mg by mouth daily. For CHF  SYSTANE BALANCE 0.6 % Soln Generic drug:  Propylene Glycol Place 1 drop into both eyes 2 (two) times daily.  tamsulosin 0.4 MG Caps capsule Commonly known as:  FLOMAX Take 0.4 mg by mouth daily. For incontinence  torsemide 20 MG tablet Commonly known as:  DEMADEX Take 20 mg by mouth daily.  VITAMIN B-12 PO Take 1 tablet by mouth daily.  VITAMIN D-3 PO Take 1,000 Int'l Units/day by mouth daily.  zolpidem 5 MG tablet Commonly known as:  AMBIEN Take 5 mg by mouth at bedtime.      Review of Systems  Constitutional: Negative for malaise/fatigue.  Respiratory: Negative for cough and shortness of breath.  Cardiovascular: Negative for chest pain, palpitations  Edema appears to be relatively baseline Gastrointestinal: Negative for heartburn, abdominal pain and constipation.  Musculoskeletal: Negative for myalgias, back pain and joint pain.  Skin:  . does not complain of rashes or itching Neurological: Negative for dizziness headache or syncopal type episodes has weakness with history of multiple sclerosis.  Psychiatric/Behavioral: The patient is not nervous/anxious      Immunization History  Administered Date(s) Administered  . Influenza,inj,Quad PF,36+ Mos 08/01/2013  . Influenza-Unspecified 08/31/2014, 08/09/2015  . Pneumococcal Polysaccharide-23 08/01/2013  .  Pneumococcal-Unspecified 08/30/2013, 07/29/2014       Pertinent  Health Maintenance Due  Topic Date Due  . PAP SMEAR  02/04/2000  . MAMMOGRAM  11/11/2012  . COLONOSCOPY  11/11/2012  . INFLUENZA VACCINE  06/01/2016 (Originally 01/31/2016)   No flowsheet data found. Functional Status Survey     Body mass index is 39.01 kg/m. Physical Exam  T-97.3 P-79 R18 BP- 123/61 wt-237.7  Constitutional: no distress.  sitting Comfortably in her wheelchair  Overweight  Neck: Neck supple. No JVD present. No thyromegaly present.  Cardiovascular: Normal rate, regular rhythm and intact distal pulses.  Respiratory: Effort normal and breath sounds normal. No respiratory distress.  GI: Soft. Bowel sounds are normal. She exhibits no distension. There is no tenderness. Abdomen is obese  Musculoskeletal: She exhibits mild edema this  is slightly more on the right versus the left and she states this is chronic there is no erythema or tenderness or warmth  Is able to move right upper extremity Limited movement in her lower extremities and left upper extremity is not new  Neurological: alert and oriented X 3 .  Skin: Skin is warm and dry. She is not diaphoretic.    Labs reviewed  03-23-16  Na-142---k-4.1.  Creat-1.42  Bun 18.5  \\03-03-16  WBC-5.3--Hbg-10.0  Plt-242      02/27/2016.  Sodium 142 potassium 3.7 BUN 17.5 creatinine 1.37.  02/22/2016.  Sodium 143 potassium 3.8 BUN 18.3 creatinine 1.34  02/22/2011.    Recent Labs (within last 365 days)   Recent Labs  07/20/15 0503 07/21/15 0450 07/22/15 0540 07/23/15 0450  11/08/15 0042 11/09/15 0441 11/16/15 01/25/16 1641 03/03/16  NA 141 135 142 142  < > 148* 143 141 142 142  K 3.1* 3.0* 3.2* 4.9  < > 4.3 4.1 3.9 4.2 3.4  CL 108 105 110 109  < > 110 108  --  103  --   CO2 25 23 24 25   < > 28 24  --  23  --   GLUCOSE 107* 296* 118* 135*  < > 130* 112*  --  113*  --   BUN 7 <5* 5* 7  < > 23* 22* 15 16 19     CREATININE 0.54 0.59 0.64 0.67  < > 1.24* 1.07* 0.8 1.20* 1.3*  CALCIUM 8.6* 8.4* 8.3* 9.1  < > 9.6 9.1  --  9.0  --   MG 1.8 1.7 1.6* 2.2  --   --   --   --   --   --   PHOS 2.5 2.6 2.9  --   --   --   --   --   --   --   < > = values in this interval not displayed.    Recent Labs (within last 365 days)   Recent Labs  11/07/15 1406 11/08/15 0042 11/16/15 01/25/16 1641 03/03/16  AST 22 21 13 12 13   ALT 23 20 13 12 11   ALKPHOS 153* 144* 126* 138* 140*  BILITOT 0.5 0.4  --  <0.2  --   PROT 8.4* 7.8  --  6.8  --   ALBUMIN 4.2 4.0  --  3.8  --       Recent Labs (within last 365 days)   Recent Labs  07/10/15 1124  11/07/15 1406 11/08/15 0042 11/16/15 01/25/16 1641 03/03/16  WBC 6.1  < > 7.5 9.9 8.2 5.3 5.3  NEUTROABS 3.8  --  3.8  --   --  3.0  --   HGB 11.7*  < > 11.6* 11.0* 9.4*  --  10.0*  HCT 36.0  < > 35.6* 33.8* 31* 31.9* 33*  MCV 82.6  < > 82.2 81.8  --  82  --   PLT 276  < > 346 322 249 281 242  < > = values in this interval not displayed.        Lab Results  Component Value Date     03/03/2016   Recent Labs       Lab Results  Component Value Date   HGBA1C 5.3 01/11/2015     Recent Labs       Lab Results  Component Value Date   CHOL 145 05/01/2012   HDL 23 (L) 05/01/2012   LDLCALC 108 (H) 05/01/2012   TRIG  72 05/01/2012   CHOLHDL 6.3 05/01/2012      Significant Diagnostic Results in last 30 days:  Imaging Results  No results found.    Assessment/Plan  1. MS: is slowly declining Has been followed by neurology; Has paraplegia.will continue amantadine 100 mg daily baclofen 20 mg tid prn for muscle spasms;  She is followed by neurology.  I she also is on Neurontin 700 mg twice a day for pain   2. Hypertension: will continue coreg 25 mg twice daily and will monitor recent blood pressure stable-  3. Chronic combined systolic and diastolic heart failure: is stable will continue lToresmide mg daily; with k+ 20  meq daily; will continue aldactone 25 mg daily; her EF is 60-65%. --Her weights appear to be relatively stable on edema appears to have stabilized    4. Neurogenic bladder: takes flomax 0.4 mg daily myrbetriq 25 mg daily  this apparently is relatively stable   5. Depression: is presently taking cymbalta 60 mg daily; which also helps with her pain management. Is taking ambien 5 mg nightly for sleep and melatonin was  added patient would like to be on both apparently this is helping in combination  6. Chronic pain: she is presently stable will continue neurontin 700 mg twice daily; takes cymbalta 60 mg daily ---   9. Constipation: will continue miralax daily as needed and senna s twice daily   10. Gerd: will continue pepcid 20 mg twice daily   11. Glaucoma: will continue xalatan to both eyes.      #12 anemia-l  Hgb relatively baseline at 10 on lab in September  Will update  #13--renal insuff--creat of 1.42 on slightly higher end of baseline--will update  [phone]

## 2016-05-09 DIAGNOSIS — R1311 Dysphagia, oral phase: Secondary | ICD-10-CM | POA: Diagnosis not present

## 2016-05-09 DIAGNOSIS — I10 Essential (primary) hypertension: Secondary | ICD-10-CM | POA: Diagnosis not present

## 2016-05-09 DIAGNOSIS — G35 Multiple sclerosis: Secondary | ICD-10-CM | POA: Diagnosis not present

## 2016-05-09 LAB — CBC AND DIFFERENTIAL
HEMATOCRIT: 34 % — AB (ref 36–46)
Hemoglobin: 10.7 g/dL — AB (ref 12.0–16.0)
Platelets: 238 10*3/uL (ref 150–399)
WBC: 4.9 10*3/mL

## 2016-05-09 LAB — BASIC METABOLIC PANEL
BUN: 15 mg/dL (ref 4–21)
Creatinine: 1.2 mg/dL — AB (ref 0.5–1.1)
GLUCOSE: 108 mg/dL
POTASSIUM: 4 mmol/L (ref 3.4–5.3)
SODIUM: 141 mmol/L (ref 137–147)

## 2016-05-10 DIAGNOSIS — G35 Multiple sclerosis: Secondary | ICD-10-CM | POA: Diagnosis not present

## 2016-05-10 DIAGNOSIS — R1311 Dysphagia, oral phase: Secondary | ICD-10-CM | POA: Diagnosis not present

## 2016-05-11 DIAGNOSIS — R1311 Dysphagia, oral phase: Secondary | ICD-10-CM | POA: Diagnosis not present

## 2016-05-11 DIAGNOSIS — G35 Multiple sclerosis: Secondary | ICD-10-CM | POA: Diagnosis not present

## 2016-05-14 DIAGNOSIS — G35 Multiple sclerosis: Secondary | ICD-10-CM | POA: Diagnosis not present

## 2016-05-14 DIAGNOSIS — R1311 Dysphagia, oral phase: Secondary | ICD-10-CM | POA: Diagnosis not present

## 2016-05-15 DIAGNOSIS — G35 Multiple sclerosis: Secondary | ICD-10-CM | POA: Diagnosis not present

## 2016-05-15 DIAGNOSIS — R1311 Dysphagia, oral phase: Secondary | ICD-10-CM | POA: Diagnosis not present

## 2016-05-16 DIAGNOSIS — G35 Multiple sclerosis: Secondary | ICD-10-CM | POA: Diagnosis not present

## 2016-05-16 DIAGNOSIS — R1311 Dysphagia, oral phase: Secondary | ICD-10-CM | POA: Diagnosis not present

## 2016-05-17 DIAGNOSIS — R1311 Dysphagia, oral phase: Secondary | ICD-10-CM | POA: Diagnosis not present

## 2016-05-17 DIAGNOSIS — G35 Multiple sclerosis: Secondary | ICD-10-CM | POA: Diagnosis not present

## 2016-05-21 DIAGNOSIS — G35 Multiple sclerosis: Secondary | ICD-10-CM | POA: Diagnosis not present

## 2016-05-21 DIAGNOSIS — R1311 Dysphagia, oral phase: Secondary | ICD-10-CM | POA: Diagnosis not present

## 2016-05-23 DIAGNOSIS — G35 Multiple sclerosis: Secondary | ICD-10-CM | POA: Diagnosis not present

## 2016-05-23 DIAGNOSIS — R1311 Dysphagia, oral phase: Secondary | ICD-10-CM | POA: Diagnosis not present

## 2016-05-24 DIAGNOSIS — G35 Multiple sclerosis: Secondary | ICD-10-CM | POA: Diagnosis not present

## 2016-05-24 DIAGNOSIS — R1311 Dysphagia, oral phase: Secondary | ICD-10-CM | POA: Diagnosis not present

## 2016-05-25 DIAGNOSIS — R1311 Dysphagia, oral phase: Secondary | ICD-10-CM | POA: Diagnosis not present

## 2016-05-25 DIAGNOSIS — G35 Multiple sclerosis: Secondary | ICD-10-CM | POA: Diagnosis not present

## 2016-05-28 DIAGNOSIS — R1311 Dysphagia, oral phase: Secondary | ICD-10-CM | POA: Diagnosis not present

## 2016-05-28 DIAGNOSIS — G35 Multiple sclerosis: Secondary | ICD-10-CM | POA: Diagnosis not present

## 2016-05-30 DIAGNOSIS — R1311 Dysphagia, oral phase: Secondary | ICD-10-CM | POA: Diagnosis not present

## 2016-05-30 DIAGNOSIS — G35 Multiple sclerosis: Secondary | ICD-10-CM | POA: Diagnosis not present

## 2016-05-31 DIAGNOSIS — R1311 Dysphagia, oral phase: Secondary | ICD-10-CM | POA: Diagnosis not present

## 2016-05-31 DIAGNOSIS — G35 Multiple sclerosis: Secondary | ICD-10-CM | POA: Diagnosis not present

## 2016-06-01 DIAGNOSIS — G35 Multiple sclerosis: Secondary | ICD-10-CM | POA: Diagnosis not present

## 2016-06-01 DIAGNOSIS — R1311 Dysphagia, oral phase: Secondary | ICD-10-CM | POA: Diagnosis not present

## 2016-06-04 DIAGNOSIS — G35 Multiple sclerosis: Secondary | ICD-10-CM | POA: Diagnosis not present

## 2016-06-04 DIAGNOSIS — R1311 Dysphagia, oral phase: Secondary | ICD-10-CM | POA: Diagnosis not present

## 2016-06-05 DIAGNOSIS — E039 Hypothyroidism, unspecified: Secondary | ICD-10-CM | POA: Diagnosis not present

## 2016-06-05 DIAGNOSIS — R1311 Dysphagia, oral phase: Secondary | ICD-10-CM | POA: Diagnosis not present

## 2016-06-05 DIAGNOSIS — N390 Urinary tract infection, site not specified: Secondary | ICD-10-CM | POA: Diagnosis not present

## 2016-06-05 DIAGNOSIS — R319 Hematuria, unspecified: Secondary | ICD-10-CM | POA: Diagnosis not present

## 2016-06-05 DIAGNOSIS — G35 Multiple sclerosis: Secondary | ICD-10-CM | POA: Diagnosis not present

## 2016-06-05 DIAGNOSIS — Z79899 Other long term (current) drug therapy: Secondary | ICD-10-CM | POA: Diagnosis not present

## 2016-06-06 DIAGNOSIS — G35 Multiple sclerosis: Secondary | ICD-10-CM | POA: Diagnosis not present

## 2016-06-06 DIAGNOSIS — R1311 Dysphagia, oral phase: Secondary | ICD-10-CM | POA: Diagnosis not present

## 2016-06-07 DIAGNOSIS — G35 Multiple sclerosis: Secondary | ICD-10-CM | POA: Diagnosis not present

## 2016-06-07 DIAGNOSIS — R1311 Dysphagia, oral phase: Secondary | ICD-10-CM | POA: Diagnosis not present

## 2016-06-08 DIAGNOSIS — R1311 Dysphagia, oral phase: Secondary | ICD-10-CM | POA: Diagnosis not present

## 2016-06-08 DIAGNOSIS — G35 Multiple sclerosis: Secondary | ICD-10-CM | POA: Diagnosis not present

## 2016-06-12 ENCOUNTER — Non-Acute Institutional Stay (SKILLED_NURSING_FACILITY): Payer: Medicare Other | Admitting: Internal Medicine

## 2016-06-12 ENCOUNTER — Encounter: Payer: Self-pay | Admitting: Internal Medicine

## 2016-06-12 DIAGNOSIS — F329 Major depressive disorder, single episode, unspecified: Secondary | ICD-10-CM

## 2016-06-12 DIAGNOSIS — K5909 Other constipation: Secondary | ICD-10-CM

## 2016-06-12 DIAGNOSIS — N319 Neuromuscular dysfunction of bladder, unspecified: Secondary | ICD-10-CM

## 2016-06-12 DIAGNOSIS — I10 Essential (primary) hypertension: Secondary | ICD-10-CM | POA: Diagnosis not present

## 2016-06-12 DIAGNOSIS — I5042 Chronic combined systolic (congestive) and diastolic (congestive) heart failure: Secondary | ICD-10-CM | POA: Diagnosis not present

## 2016-06-12 DIAGNOSIS — R1311 Dysphagia, oral phase: Secondary | ICD-10-CM | POA: Diagnosis not present

## 2016-06-12 DIAGNOSIS — F32A Depression, unspecified: Secondary | ICD-10-CM

## 2016-06-12 DIAGNOSIS — G35 Multiple sclerosis: Secondary | ICD-10-CM

## 2016-06-12 NOTE — Progress Notes (Signed)
Patient ID: Shannon Garrett, female   DOB: 05/14/1961, 55 y.o.   MRN: [id number]   Location:  [facility] Room Number: 230-B Place of Service:  SNF [phone]) Provider:  Granville Lewis, PA-C  Gildardo Cranker, DO  Patient Care Team: Gildardo Cranker, DO as PCP - General (Internal Medicine) Gerlene Fee, NP as Nurse Practitioner (Nurse Practitioner)  Extended Emergency Contact Information Primary Emergency Contact: [place], [place] [address] [place] Phone: [phone] Relation: Sister Secondary Emergency Contact: Jefm Bryant, [place] Phone: [phone] Mobile Phone: [phone] Relation: Friend  Code Status:  DNR Goals of care: Advanced Directive information Advanced Directives 06/12/2016  Does Patient Have a Medical Advance Directive? Yes  Type of Advance Directive Out of facility DNR (pink MOST or yellow form)  Does patient want to make changes to medical advance directive? -  Copy of [place] in Chart? -  Would patient like information on creating a medical advance directive? -  Pre-existing out of facility DNR order (yellow form or pink MOST form) -     Chief Complaint  Patient presents with  . Medical Management of Chronic Issues    Follow up   Medical management of chronic medical conditions including multiple sclerosis-CHF-depression-GERD-neuropathy-neurogenic bladder HPI:  Pt is a 55 y.o. female seen today for medical management of chronic diseases. As noted above.  She does have his see history of multiple sclerosis and appears to be having a gradual decline she is on amantadine as well as baclofen and is followed by neurology.  Regards to CHF she continues on Demadex 20 mg a day as well as Aldactone 25 mg a day-she has had some weight fluctuations but most current weight of 242 appears to be relatively stable-she feels she's had some increased  edema at times but does not complain of any increased shortness of breath or chest pain.  She is on a beta blocker Coreg as well.  Previously she had been on an appetite suppressant but that was not effective and was discontinued. She is hoping for and possibly a gastric procedure-but this is a challenge with her history of multiple sclerosis  She also was seen recently by psychiatric services for insomnia and has been started on Rozerem  Apparently this is helping some she is not complaining of insomnia.   She is completing course of Cipro for a UTI does not complaining of dysuria today she does have a history  Neurogenic bladder  Her vital signs appear to be stable blood pressures appear to be generally in the 07/22/1928 range with occasionally spikes slightly above that            Past Medical History:  Diagnosis Date  . Cardiomyopathy ([place])    a.  Echo 04/29/12: Mild LVH, EF 20-25%, mild AI, moderate MR, moderate LAE, mild RAE, mild RVE, moderate TR, PASP 51, small pericardial effusion;   b. probably non-ischemic given multiple chemo-Tx agents used for MS and global LV dysfn on echo  . Chronic systolic heart failure ([place])   . Depression   . Glaucoma   . Hypertension   . MS (multiple sclerosis) ([place])    a. Dx'd late 20's. b. Tx with Novantrone, Tysabri, Copaxone previously.   Past Surgical History:  Procedure Laterality Date  . ABLATION     uterine  . CESAREAN  SECTION      Allergies  Allergen Reactions  . Sulfonamide Derivatives Hives and Shortness Of Breath  . Penicillins Hives and Itching    Has patient had a PCN reaction causing immediate rash, facial/tongue/throat swelling, SOB or lightheadedness with hypotension: unknown Has patient had a PCN reaction causing severe rash involving mucus membranes or skin necrosis: unknown Has patient had a PCN reaction that required hospitalization: unknown Has patient had a PCN reaction occurring within the last 10 years:  unknown If all of the above answers are "NO", then may proceed with Cephalosporin use. Prior course of rocephin charted 05/2015      Medication List       Accurate as of 06/12/16 11:31 AM. Always use your most recent med list.          acetaminophen 325 MG tablet Commonly known as:  TYLENOL Take 2 tablets (650 mg total) by mouth every 6 (six) hours as needed for mild pain or fever (or Fever >/= 101).   albuterol (2.5 MG/3ML) 0.083% nebulizer solution Commonly known as:  PROVENTIL Take 3 mLs (2.5 mg total) by nebulization every 2 (two) hours as needed for wheezing.   amantadine 100 MG capsule Commonly known as:  SYMMETREL Take 100 mg by mouth daily.   baclofen 20 MG tablet Commonly known as:  LIORESAL Take 1 tablet (20 mg total) by mouth 3 (three) times daily as needed for muscle spasms.   carvedilol 25 MG tablet Commonly known as:  COREG Take 1 tablet (25 mg total) by mouth 2 (two) times daily with a meal.   ciprofloxacin 500 MG tablet Commonly known as:  CIPRO Take 500 mg by mouth 2 (two) times daily.   CRANBERRY PO Take 100 mg by mouth daily.   DULoxetine 60 MG capsule Commonly known as:  CYMBALTA Take 60 mg by mouth daily.   famotidine 20 MG tablet Commonly known as:  PEPCID Take 1 tablet (20 mg total) by mouth 2 (two) times daily.   gabapentin 100 MG capsule Commonly known as:  NEURONTIN Take 7 capsules (700 mg total) by mouth 2 (two) times daily.   hydrocortisone cream 0.5 % Apply 1 application topically 2 (two) times daily.   LACTAID PO Take 1 tablet by mouth daily.   Lactulose 20 GM/30ML Soln Take 30 mLs by mouth daily.   latanoprost 0.005 % ophthalmic solution Commonly known as:  XALATAN Place 1 drop into both eyes at bedtime.   magnesium hydroxide 400 MG/5ML suspension Commonly known as:  MILK OF MAGNESIA Take 30 mLs by mouth every 8 (eight) hours as needed for mild constipation or moderate constipation.   mirabegron ER 25 MG Tb24  tablet Commonly known as:  MYRBETRIQ Take 50 mg by mouth at bedtime.   ondansetron 4 MG tablet Commonly known as:  ZOFRAN Take 1 tablet (4 mg total) by mouth every 6 (six) hours as needed for nausea.   polyethylene glycol packet Commonly known as:  MIRALAX / GLYCOLAX Take 17 g by mouth daily as needed for mild constipation.   potassium chloride 20 MEQ packet Commonly known as:  KLOR-CON Take 20 mEq by mouth daily.   ramelteon 8 MG tablet Commonly known as:  ROZEREM Take 8 mg by mouth at bedtime.   SENOKOT S 8.6-50 MG tablet Generic drug:  senna-docusate Take 1 tablet by mouth 2 (two) times daily.   spironolactone 25 MG tablet Commonly known as:  ALDACTONE Take 25 mg by mouth daily. For CHF  SYSTANE BALANCE 0.6 % Soln Generic drug:  Propylene Glycol Place 1 drop into both eyes 2 (two) times daily.   tamsulosin 0.4 MG Caps capsule Commonly known as:  FLOMAX Take 0.4 mg by mouth daily. For incontinence   torsemide 20 MG tablet Commonly known as:  DEMADEX Take 20 mg by mouth daily.   VITAMIN D-3 PO Take 1,000 Int'l Units/day by mouth daily.     Of note she has completed the Cipro for history of UTI  Review of Systems  Constitutional: Negative for malaise/fatigue.  Respiratory: Negative for cough and shortness of breath.  Cardiovascular: Negative for chest pain, palpitations Continued to have some mild edema more so on the left Gastrointestinal: Negative for heartburn, abdominal pain and constipation. Says she has gas discomfort at times Musculoskeletal: Negative for myalgias, back pain and joint pain.  Skin:  . does not complain of rashes or itching Neurological: Negative for dizzinessheadache or syncopal type episodes has weakness with history of multiple sclerosis.  Psychiatric/Behavioral: The patient is not nervous/anxious    Immunization History  Administered Date(s) Administered  . Influenza,inj,Quad PF,36+ Mos 08/01/2013  . Influenza-Unspecified  08/31/2014, 08/09/2015  . Pneumococcal Polysaccharide-23 08/01/2013  . Pneumococcal-Unspecified 08/30/2013, 07/29/2014   Pertinent  Health Maintenance Due  Topic Date Due  . PAP SMEAR  02/04/2000  . MAMMOGRAM  11/11/2012  . COLONOSCOPY  11/11/2012  . INFLUENZA VACCINE  01/31/2016   No flowsheet data found. Functional Status Survey:    Vitals:   06/12/16 1107  BP: (!) 144/78  Pulse: 76  Resp: 18  Temp: 98.1 F (36.7 C)  TempSrc: Oral  SpO2: 98%  Weight: 242 lb (109.8 kg)  Height: 5\' 5"  (1.651 m)  Baseline blood pressures appear to be 123456  systolically and  the [id number] diastolic Body mass index is 40.27 kg/m. Physical Exam   Constitutional: no distress. sitting Comfortably in her wheelchair  Overweight  Neck: Neck supple. No JVD present. No thyromegaly present.  Cardiovascular: Normal rate, regular rhythm and intact distal pulses.  Respiratory: Effort normal and breath sounds normal. No respiratory distress.  GI: Soft. Bowel sounds are normal. She exhibits no distension. There is no tenderness. Abdomen is obese  Musculoskeletal: She exhibitsmild edema this is slightly increased on the left  there is no erythema or tenderness or warmth  Is able to move right upper extremity Limited movement in her lower extremities and left upper extremity is not new  Neurological: alert and oriented X 3 .  Skin: Skin is warm and dry. She is not diaphoretic  Psych continues to be pleasant and appropriate.  Labs reviewed  05/09/2016.  WBC 4.9 hemoglobin 10.7 platelets 238.  Sodium 141 potassium 4.0 BUN 14.9 creatinine 1.2.  :  Recent Labs  07/20/15 0503 07/21/15 0450 07/22/15 0540 07/23/15 0450  11/08/15 0042 11/09/15 0441  01/25/16 1641 03/03/16 03/23/16  NA 141 135 142 142  < > 148* 143  < > 142 142 142  K 3.1* 3.0* 3.2* 4.9  < > 4.3 4.1  < > 4.2 3.4 4.1  CL 108 105 110 109  < > 110 108  --  103  --   --   CO2 25 23 24 25   < > 28 24  --  23  --   --     GLUCOSE 107* 296* 118* 135*  < > 130* 112*  --  113*  --   --   BUN 7 <5* 5* 7  < > 23* 22*  < >  16 19 19   CREATININE 0.54 0.59 0.64 0.67  < > 1.24* 1.07*  < > 1.20* 1.3* 1.4*  CALCIUM 8.6* 8.4* 8.3* 9.1  < > 9.6 9.1  --  9.0  --   --   MG 1.8 1.7 1.6* 2.2  --   --   --   --   --   --   --   PHOS 2.5 2.6 2.9  --   --   --   --   --   --   --   --   < > = values in this interval not displayed.  Recent Labs  11/07/15 1406 11/08/15 0042 11/16/15 01/25/16 1641 03/03/16  AST 22 21 13 12 13   ALT 23 20 13 12 11   ALKPHOS 153* 144* 126* 138* 140*  BILITOT 0.5 0.4  --  <0.2  --   PROT 8.4* 7.8  --  6.8  --   ALBUMIN 4.2 4.0  --  3.8  --     Recent Labs  07/10/15 1124  11/07/15 1406 11/08/15 0042 11/16/15 01/25/16 1641 03/03/16  WBC 6.1  < > 7.5 9.9 8.2 5.3 5.3  NEUTROABS 3.8  --  3.8  --   --  3.0  --   HGB 11.7*  < > 11.6* 11.0* 9.4*  --  10.0*  HCT 36.0  < > 35.6* 33.8* 31* 31.9* 33*  MCV 82.6  < > 82.2 81.8  --  82  --   PLT 276  < > 346 322 249 281 242  < > = values in this interval not displayed. Lab Results  Component Value Date   TSH 2.87 03/03/2016   Lab Results  Component Value Date   HGBA1C 5.3 01/11/2015   Lab Results  Component Value Date   CHOL 145 05/01/2012   HDL 23 (L) 05/01/2012   LDLCALC 108 (H) 05/01/2012   TRIG 72 05/01/2012   CHOLHDL 6.3 05/01/2012    Significant Diagnostic Results in last 30 days:  No results found.  Assessment/Plan  *1. MS: is slowly declining Has been followed by neurology; Has paraplegia.will continue amantadine 100 mg daily baclofen 20 mg tid prn for muscle spasms;  She is followed by neurology.  I she also is on Neurontin 700 mg twice a day for pain  2. Hypertension: will continue coreg 25 mg twice daily and will monitor recent blood pressure stable as noted above I do not see consistent elevations-  3. Chronic combined systolic and diastolic heart failure: is stable will continue lToresmide mg daily; with  k+ 20 meq daily; will continue aldactone 25 mg daily; her EF is 60-65%. --Her weights appear to be relatively stable up about 5 pounds over the past several weeks if scales are accurate -- slightly increased edema on the left-will order a BNP as well as a BMP also obtain a venous Doppler of the left leg rule out any DVT I suspect the likelihood is fairly low however We'll order more frequent weights for now and notify provider of gain greater than 3 pounds   4. Neurogenic bladder: takes flomax 0.4 mg daily myrbetriq 25 mg daily this  is relatively stable-did have a recent UTI treated with Cipro   5. Depression: is presently taking cymbalta 60 mg daily; which also helps with her pain management. I For insomnia she is now on Rozerem   6. Chronic pain: she is presently stable will continue neurontin 700 mg twice daily; takes cymbalta 60  mg daily ---   9. Constipation: will continue miralax daily as needed and senna s twice daily also continues on lactulose  10. Gerd: will continue pepcid 20 mg twice daily   11. Glaucoma: will continue xalatan to both eyes.    #12 anemia-l   Hemoglobin of 10.7 appears to be baseline  #13--renal insuff--creat of 1.2 on lab 05/09/2016 is relatively baseline Will update this as noted above  #14 history of gas pain Will start simethicone 40 mg 4 times a day when necessary and monitor-  [id number]-of note greater than 40 minutes spent assessing patient-reviewing her chart-reviewing her labs-and coordinating and formulating a plan of care for numerous diagnoses-of note greater than 50% of time spent coordinating plan of care

## 2016-06-13 DIAGNOSIS — G35 Multiple sclerosis: Secondary | ICD-10-CM | POA: Diagnosis not present

## 2016-06-13 DIAGNOSIS — R600 Localized edema: Secondary | ICD-10-CM | POA: Diagnosis not present

## 2016-06-13 DIAGNOSIS — R1311 Dysphagia, oral phase: Secondary | ICD-10-CM | POA: Diagnosis not present

## 2016-06-14 DIAGNOSIS — R1311 Dysphagia, oral phase: Secondary | ICD-10-CM | POA: Diagnosis not present

## 2016-06-14 DIAGNOSIS — Z79899 Other long term (current) drug therapy: Secondary | ICD-10-CM | POA: Diagnosis not present

## 2016-06-14 DIAGNOSIS — R0602 Shortness of breath: Secondary | ICD-10-CM | POA: Diagnosis not present

## 2016-06-14 DIAGNOSIS — G35 Multiple sclerosis: Secondary | ICD-10-CM | POA: Diagnosis not present

## 2016-06-14 DIAGNOSIS — I10 Essential (primary) hypertension: Secondary | ICD-10-CM | POA: Diagnosis not present

## 2016-06-14 LAB — BASIC METABOLIC PANEL
BUN: 27 mg/dL — AB (ref 4–21)
CREATININE: 1.5 mg/dL — AB (ref 0.5–1.1)
Glucose: 133 mg/dL
Potassium: 3.5 mmol/L (ref 3.4–5.3)
Sodium: 140 mmol/L (ref 137–147)

## 2016-06-15 DIAGNOSIS — G35 Multiple sclerosis: Secondary | ICD-10-CM | POA: Diagnosis not present

## 2016-06-15 DIAGNOSIS — R1311 Dysphagia, oral phase: Secondary | ICD-10-CM | POA: Diagnosis not present

## 2016-06-18 DIAGNOSIS — G35 Multiple sclerosis: Secondary | ICD-10-CM | POA: Diagnosis not present

## 2016-06-18 DIAGNOSIS — R1311 Dysphagia, oral phase: Secondary | ICD-10-CM | POA: Diagnosis not present

## 2016-06-19 DIAGNOSIS — G35 Multiple sclerosis: Secondary | ICD-10-CM | POA: Diagnosis not present

## 2016-06-19 DIAGNOSIS — R1311 Dysphagia, oral phase: Secondary | ICD-10-CM | POA: Diagnosis not present

## 2016-06-20 DIAGNOSIS — G35 Multiple sclerosis: Secondary | ICD-10-CM | POA: Diagnosis not present

## 2016-06-20 DIAGNOSIS — R1311 Dysphagia, oral phase: Secondary | ICD-10-CM | POA: Diagnosis not present

## 2016-07-09 DIAGNOSIS — R1311 Dysphagia, oral phase: Secondary | ICD-10-CM | POA: Diagnosis not present

## 2016-07-09 DIAGNOSIS — G35 Multiple sclerosis: Secondary | ICD-10-CM | POA: Diagnosis not present

## 2016-07-09 DIAGNOSIS — R531 Weakness: Secondary | ICD-10-CM | POA: Diagnosis not present

## 2016-07-11 DIAGNOSIS — G35 Multiple sclerosis: Secondary | ICD-10-CM | POA: Diagnosis not present

## 2016-07-11 DIAGNOSIS — R531 Weakness: Secondary | ICD-10-CM | POA: Diagnosis not present

## 2016-07-11 DIAGNOSIS — R1311 Dysphagia, oral phase: Secondary | ICD-10-CM | POA: Diagnosis not present

## 2016-07-12 DIAGNOSIS — R1311 Dysphagia, oral phase: Secondary | ICD-10-CM | POA: Diagnosis not present

## 2016-07-12 DIAGNOSIS — R531 Weakness: Secondary | ICD-10-CM | POA: Diagnosis not present

## 2016-07-12 DIAGNOSIS — G35 Multiple sclerosis: Secondary | ICD-10-CM | POA: Diagnosis not present

## 2016-07-13 DIAGNOSIS — R1311 Dysphagia, oral phase: Secondary | ICD-10-CM | POA: Diagnosis not present

## 2016-07-13 DIAGNOSIS — R531 Weakness: Secondary | ICD-10-CM | POA: Diagnosis not present

## 2016-07-13 DIAGNOSIS — G35 Multiple sclerosis: Secondary | ICD-10-CM | POA: Diagnosis not present

## 2016-07-14 DIAGNOSIS — R1311 Dysphagia, oral phase: Secondary | ICD-10-CM | POA: Diagnosis not present

## 2016-07-14 DIAGNOSIS — R531 Weakness: Secondary | ICD-10-CM | POA: Diagnosis not present

## 2016-07-14 DIAGNOSIS — G35 Multiple sclerosis: Secondary | ICD-10-CM | POA: Diagnosis not present

## 2016-07-16 DIAGNOSIS — G35 Multiple sclerosis: Secondary | ICD-10-CM | POA: Diagnosis not present

## 2016-07-16 DIAGNOSIS — R1311 Dysphagia, oral phase: Secondary | ICD-10-CM | POA: Diagnosis not present

## 2016-07-16 DIAGNOSIS — R531 Weakness: Secondary | ICD-10-CM | POA: Diagnosis not present

## 2016-07-17 DIAGNOSIS — R1311 Dysphagia, oral phase: Secondary | ICD-10-CM | POA: Diagnosis not present

## 2016-07-17 DIAGNOSIS — R531 Weakness: Secondary | ICD-10-CM | POA: Diagnosis not present

## 2016-07-17 DIAGNOSIS — G35 Multiple sclerosis: Secondary | ICD-10-CM | POA: Diagnosis not present

## 2016-07-19 DIAGNOSIS — G35 Multiple sclerosis: Secondary | ICD-10-CM | POA: Diagnosis not present

## 2016-07-19 DIAGNOSIS — R531 Weakness: Secondary | ICD-10-CM | POA: Diagnosis not present

## 2016-07-19 DIAGNOSIS — R1311 Dysphagia, oral phase: Secondary | ICD-10-CM | POA: Diagnosis not present

## 2016-07-20 DIAGNOSIS — G35 Multiple sclerosis: Secondary | ICD-10-CM | POA: Diagnosis not present

## 2016-07-20 DIAGNOSIS — R531 Weakness: Secondary | ICD-10-CM | POA: Diagnosis not present

## 2016-07-20 DIAGNOSIS — R1311 Dysphagia, oral phase: Secondary | ICD-10-CM | POA: Diagnosis not present

## 2016-07-21 DIAGNOSIS — R531 Weakness: Secondary | ICD-10-CM | POA: Diagnosis not present

## 2016-07-21 DIAGNOSIS — G35 Multiple sclerosis: Secondary | ICD-10-CM | POA: Diagnosis not present

## 2016-07-21 DIAGNOSIS — R1311 Dysphagia, oral phase: Secondary | ICD-10-CM | POA: Diagnosis not present

## 2016-07-23 DIAGNOSIS — G35 Multiple sclerosis: Secondary | ICD-10-CM | POA: Diagnosis not present

## 2016-07-23 DIAGNOSIS — R531 Weakness: Secondary | ICD-10-CM | POA: Diagnosis not present

## 2016-07-23 DIAGNOSIS — R1311 Dysphagia, oral phase: Secondary | ICD-10-CM | POA: Diagnosis not present

## 2016-07-24 DIAGNOSIS — G35 Multiple sclerosis: Secondary | ICD-10-CM | POA: Diagnosis not present

## 2016-07-24 DIAGNOSIS — R1311 Dysphagia, oral phase: Secondary | ICD-10-CM | POA: Diagnosis not present

## 2016-07-24 DIAGNOSIS — R531 Weakness: Secondary | ICD-10-CM | POA: Diagnosis not present

## 2016-07-25 DIAGNOSIS — R531 Weakness: Secondary | ICD-10-CM | POA: Diagnosis not present

## 2016-07-25 DIAGNOSIS — G35 Multiple sclerosis: Secondary | ICD-10-CM | POA: Diagnosis not present

## 2016-07-25 DIAGNOSIS — R1311 Dysphagia, oral phase: Secondary | ICD-10-CM | POA: Diagnosis not present

## 2016-07-26 DIAGNOSIS — R531 Weakness: Secondary | ICD-10-CM | POA: Diagnosis not present

## 2016-07-26 DIAGNOSIS — G35 Multiple sclerosis: Secondary | ICD-10-CM | POA: Diagnosis not present

## 2016-07-26 DIAGNOSIS — R1311 Dysphagia, oral phase: Secondary | ICD-10-CM | POA: Diagnosis not present

## 2016-07-27 DIAGNOSIS — G35 Multiple sclerosis: Secondary | ICD-10-CM | POA: Diagnosis not present

## 2016-07-27 DIAGNOSIS — R1311 Dysphagia, oral phase: Secondary | ICD-10-CM | POA: Diagnosis not present

## 2016-07-27 DIAGNOSIS — R531 Weakness: Secondary | ICD-10-CM | POA: Diagnosis not present

## 2016-07-30 DIAGNOSIS — R531 Weakness: Secondary | ICD-10-CM | POA: Diagnosis not present

## 2016-07-30 DIAGNOSIS — G35 Multiple sclerosis: Secondary | ICD-10-CM | POA: Diagnosis not present

## 2016-07-30 DIAGNOSIS — R1311 Dysphagia, oral phase: Secondary | ICD-10-CM | POA: Diagnosis not present

## 2016-07-31 ENCOUNTER — Ambulatory Visit: Payer: Medicare Other | Admitting: Neurology

## 2016-08-01 DIAGNOSIS — R531 Weakness: Secondary | ICD-10-CM | POA: Diagnosis not present

## 2016-08-01 DIAGNOSIS — G35 Multiple sclerosis: Secondary | ICD-10-CM | POA: Diagnosis not present

## 2016-08-01 DIAGNOSIS — R1311 Dysphagia, oral phase: Secondary | ICD-10-CM | POA: Diagnosis not present

## 2016-08-02 ENCOUNTER — Encounter: Payer: Self-pay | Admitting: Neurology

## 2016-08-02 DIAGNOSIS — R531 Weakness: Secondary | ICD-10-CM | POA: Diagnosis not present

## 2016-08-02 DIAGNOSIS — R1311 Dysphagia, oral phase: Secondary | ICD-10-CM | POA: Diagnosis not present

## 2016-08-02 DIAGNOSIS — G35 Multiple sclerosis: Secondary | ICD-10-CM | POA: Diagnosis not present

## 2016-08-04 DIAGNOSIS — G35 Multiple sclerosis: Secondary | ICD-10-CM | POA: Diagnosis not present

## 2016-08-04 DIAGNOSIS — R531 Weakness: Secondary | ICD-10-CM | POA: Diagnosis not present

## 2016-08-04 DIAGNOSIS — R1311 Dysphagia, oral phase: Secondary | ICD-10-CM | POA: Diagnosis not present

## 2016-08-06 DIAGNOSIS — R1311 Dysphagia, oral phase: Secondary | ICD-10-CM | POA: Diagnosis not present

## 2016-08-06 DIAGNOSIS — R531 Weakness: Secondary | ICD-10-CM | POA: Diagnosis not present

## 2016-08-06 DIAGNOSIS — G35 Multiple sclerosis: Secondary | ICD-10-CM | POA: Diagnosis not present

## 2016-08-07 DIAGNOSIS — R531 Weakness: Secondary | ICD-10-CM | POA: Diagnosis not present

## 2016-08-07 DIAGNOSIS — R1311 Dysphagia, oral phase: Secondary | ICD-10-CM | POA: Diagnosis not present

## 2016-08-07 DIAGNOSIS — G35 Multiple sclerosis: Secondary | ICD-10-CM | POA: Diagnosis not present

## 2016-08-08 ENCOUNTER — Encounter: Payer: Self-pay | Admitting: Adult Health

## 2016-08-08 ENCOUNTER — Non-Acute Institutional Stay (SKILLED_NURSING_FACILITY): Payer: Medicare Other | Admitting: Adult Health

## 2016-08-08 DIAGNOSIS — D539 Nutritional anemia, unspecified: Secondary | ICD-10-CM

## 2016-08-08 DIAGNOSIS — G894 Chronic pain syndrome: Secondary | ICD-10-CM

## 2016-08-08 DIAGNOSIS — G8220 Paraplegia, unspecified: Secondary | ICD-10-CM | POA: Diagnosis not present

## 2016-08-08 DIAGNOSIS — I5042 Chronic combined systolic (congestive) and diastolic (congestive) heart failure: Secondary | ICD-10-CM

## 2016-08-08 DIAGNOSIS — E271 Primary adrenocortical insufficiency: Secondary | ICD-10-CM

## 2016-08-08 DIAGNOSIS — I119 Hypertensive heart disease without heart failure: Secondary | ICD-10-CM

## 2016-08-08 DIAGNOSIS — R319 Hematuria, unspecified: Secondary | ICD-10-CM | POA: Diagnosis not present

## 2016-08-08 DIAGNOSIS — N390 Urinary tract infection, site not specified: Secondary | ICD-10-CM | POA: Diagnosis not present

## 2016-08-08 DIAGNOSIS — G35 Multiple sclerosis: Secondary | ICD-10-CM | POA: Diagnosis not present

## 2016-08-08 NOTE — Progress Notes (Signed)
Location:  [facility] Room Number: [place] of Service:  SNF (31)   CODE STATUS: DNR  Allergies  Allergen Reactions  . Sulfonamide Derivatives Hives and Shortness Of Breath  . Penicillins Hives and Itching    Has patient had a PCN reaction causing immediate rash, facial/tongue/throat swelling, SOB or lightheadedness with hypotension: unknown Has patient had a PCN reaction causing severe rash involving mucus membranes or skin necrosis: unknown Has patient had a PCN reaction that required hospitalization: unknown Has patient had a PCN reaction occurring within the last 10 years: unknown If all of the above answers are "NO", then may proceed with Cephalosporin use. Prior course of rocephin charted 05/2015    Chief Complaint  Patient presents with  . Medical Management of Chronic Issues    Routine Visit    HPI:  She is a long term resident of this facility being seen for the management of her chronic illnesses. She continues to be followed by neurology for her MS. She did state that she did miss a neurology appointment due to the facility. I am not certain about this.  She does have generalized aches and pain. She is wanting to see she qualifies for a bariatric sleeve procedure. There are no nursing concerns at this time.   Past Medical History:  Diagnosis Date  . Cardiomyopathy ([place])    a.  Echo 04/29/12: Mild LVH, EF 20-25%, mild AI, moderate MR, moderate LAE, mild RAE, mild RVE, moderate TR, PASP 51, small pericardial effusion;   b. probably non-ischemic given multiple chemo-Tx agents used for MS and global LV dysfn on echo  . Chronic systolic heart failure ([place])   . Depression   . Glaucoma   . Hypertension   . MS (multiple sclerosis) ([place])    a. Dx'd late 20's. b. Tx with Novantrone, Tysabri, Copaxone previously.    Past Surgical History:  Procedure Laterality Date  . ABLATION     uterine  . CESAREAN SECTION      Social History   Social History  .  Marital status: Single    Spouse name: N/A  . Number of children: N/A  . Years of education: N/A   Occupational History  . Not on file.   Social History Main Topics  . Smoking status: Former Research scientist (life sciences)  . Smokeless tobacco: Never Used  . Alcohol use No  . Drug use: No  . Sexual activity: Not Currently   Other Topics Concern  . Not on file   Social History Narrative  . No narrative on file   Family History  Problem Relation Age of Onset  . Hypertension Mother   . Cancer Mother     breast   . Cancer Father     prostate  . Multiple sclerosis Sister   . Heart attack Neg Hx       VITAL SIGNS BP 127/86   Pulse 91   Temp 98 F (36.7 C)   Resp 18   Ht 5\' 5"  (1.651 m)   Wt 235 lb 11.2 oz (106.9 kg)   SpO2 98%   BMI 39.22 kg/m   Patient's Medications  New Prescriptions   No medications on file  Previous Medications   ACETAMINOPHEN (TYLENOL) 325 MG TABLET    Take 2 tablets (650 mg total) by mouth every 6 (six) hours as needed for mild pain or fever (or Fever >/= 101).   ALBUTEROL (PROVENTIL) (2.5 MG/3ML) 0.083% NEBULIZER SOLUTION    Take 3 mLs (  2.5 mg total) by nebulization every 2 (two) hours as needed for wheezing.   AMANTADINE (SYMMETREL) 100 MG CAPSULE    Take 100 mg by mouth daily.    BACLOFEN (LIORESAL) 20 MG TABLET    Take 1 tablet (20 mg total) by mouth 3 (three) times daily as needed for muscle spasms.   CARVEDILOL (COREG) 25 MG TABLET    Take 1 tablet (25 mg total) by mouth 2 (two) times daily with a meal.   CHOLECALCIFEROL (VITAMIN D-3 PO)    Take 1,000 Int'l Units/day by mouth daily.    CRANBERRY PO    Take 100 mg by mouth daily.   DULOXETINE (CYMBALTA) 60 MG CAPSULE    Take 60 mg by mouth daily.   FAMOTIDINE (PEPCID) 20 MG TABLET    Take 1 tablet (20 mg total) by mouth 2 (two) times daily.   GABAPENTIN (NEURONTIN) 100 MG CAPSULE    Take 7 capsules (700 mg total) by mouth 2 (two) times daily.   HYDROCORTISONE CREAM 0.5 %    Apply 1 application topically 2  (two) times daily.   LACTASE (LACTAID PO)    Take 1 tablet by mouth daily.   LACTULOSE 20 GM/30ML SOLN    Take 30 mLs by mouth daily.   LATANOPROST (XALATAN) 0.005 % OPHTHALMIC SOLUTION    Place 1 drop into both eyes at bedtime.   MAGNESIUM HYDROXIDE (MILK OF MAGNESIA) 400 MG/5ML SUSPENSION    Take 30 mLs by mouth every 8 (eight) hours as needed for mild constipation or moderate constipation.   MENTHOL, TOPICAL ANALGESIC, 154 MG PADS    Icy Hot Patch: Apply to left shoulder topically every 8 hours as needed for pain.   MIRABEGRON ER (MYRBETRIQ) 25 MG TB24 TABLET    Take 50 mg by mouth at bedtime.    ONDANSETRON (ZOFRAN) 4 MG TABLET    Take 1 tablet (4 mg total) by mouth every 6 (six) hours as needed for nausea.   POLYETHYLENE GLYCOL (MIRALAX / GLYCOLAX) PACKET    Take 17 g by mouth daily as needed for mild constipation.   POTASSIUM CHLORIDE (KLOR-CON) 20 MEQ PACKET    Take 20 mEq by mouth daily.    PROPYLENE GLYCOL (SYSTANE BALANCE) 0.6 % SOLN    Place 1 drop into both eyes 2 (two) times daily.   RAMELTEON (ROZEREM) 8 MG TABLET    Take 8 mg by mouth at bedtime.   SENNA-DOCUSATE (SENOKOT S) 8.6-50 MG TABLET    Take 1 tablet by mouth 2 (two) times daily.   SIMETHICONE PO    Take 40 mg by mouth every 6 (six) hours.   SPIRONOLACTONE (ALDACTONE) 25 MG TABLET    Take 25 mg by mouth daily. For CHF   TAMSULOSIN (FLOMAX) 0.4 MG CAPS CAPSULE    Take 0.4 mg by mouth daily. For incontinence   TORSEMIDE (DEMADEX) 20 MG TABLET    Take 20 mg by mouth daily.  Modified Medications   No medications on file  Discontinued Medications   No medications on file     SIGNIFICANT DIAGNOSTIC EXAMS   10-08-14: MRI brain: No significant change in diffuse white matter lesions consistent with patient's history of multiple sclerosis. None of these areas demonstrate abnormal restriction or enhancement as may be seen with areas of acute demyelination. Global atrophy. Decrease in size right pituitary mass now measuring 1.2  x 0.5 x 0.9 cm versus prior 1.2 x 0.7 x 1.1 cm. Right parotid 1 cm  lesion does not have typical appearance of an intra parotid lymph node and therefore primary parotid lesion is a possibility. ENT consultation may be considered for further delineation.  10-08-14: mri of cervical; spine: Scattered new areas of altered signal intensity (compared to 2007) throughout the cervical cord as detailed above consistent with patient's history of multiple sclerosis. None of these areas demonstrate enhancement as may be seen with areas of active demyelination. Cervical spondylotic changes as detailed above most prominent C5-6 level.  12-13-14: ABI: right: mild atherosclerotic disease   01-10-15: ct of head: No acute intracranial abnormalities. Mild atrophy. White matter disease compatible with history of MS.  01-10-15: chest x-ray: No active disease  01-12-15: 2-d echo: Left ventricle: The cavity size was normal. There was mild concentric hypertrophy. Systolic function was normal. The estimated ejection fraction was in the range of 60% to 65%. Wall motion was normal; there were no regional wall motion abnormalities. Doppler parameters are consistent with abnormal left ventricular relaxation (grade 1 diastolic dysfunction). - Aortic valve: There was mild regurgitation. - Mitral valve: Calcified annulus. There was mild regurgitation.  05-30-15: chest x-ray: No active disease.  07-14-15: EEG: This EEG is consistent with a mild generalized non-specific cerebral dysfunction(encephalopathy).   07-20-15: chest x-ray; Interval extubation. Decreasing lung volumes with increasing bibasilar opacities, likely atelectasis.  11-07-15: ct of head; No acute intracranial abnormality. White matter changes are suggestive for chronic small vessel ischemic disease. Right maxillary sinus disease.     LABS REVIEWED:   04-18-15: enterococcus faecalis: macrobid  05-30-15: urine culture: multiple bacteria  06-02-15: wbc 12.2; hgb  10.4; hct 32.0; mcv 84.7; plt 257; glucose 119; bun 18; creat 0.99; k+ 3.5; na++150; liver normal albumin 3.3  07-10-15: tsh 0.557; vit B12: 2449 07-23-15: wbc 7.9; hgb 8.7; hct 25.5; mcv 79.4; plt 304; glucose 135; bun 7; creat 0.67; k+ 4.9; na++142; mag 2.2  08-19-15: wbc 9.9; hgb 10.6; hct 33.2; mcv 81.8; plt 400 glucose 105; bun 11.0; creat 1.13; k+ 4.1; na++141; liver normal albumin 3.7  urine culture: proteus mirabilis: cipro  11-16-15: wbc 8.2; hgb 9.4; hct 30.9; mcv 82.5; plt 249 glucose 89; bun 15.0; creat 0.75; k+ 3.9; na++ 141; liver normal albumin 3.5  03-03-16: wbc 5.3; hgb 10.0; hct 32.6; mcv 81.9; plt 242; glucose 149; bun 15.2; creat 1.26; k+ 3.4; na++ 143; ast 140; albumin 3.9; vit B 12: >2000 03-23-16: glucose 94; bun 18.5; creat 1.42; k+ 4.1; na++ 142     Review of Systems  Constitutional: Negative for malaise/fatigue.  Respiratory: Negative for cough and shortness of breath.   Cardiovascular: Negative for chest pain, palpitations and leg swelling.  Gastrointestinal: Negative for heartburn, abdominal pain and constipation.  Feels as though she cannot empty her bladder Musculoskeletal: has generalized aches and pains.   Skin: Negative.   Neurological: Negative for dizziness.  Psychiatric/Behavioral: does have depression and insomnia.      Physical Exam Constitutional: . No distress.  Obese   Neck: Neck supple. No JVD present. No thyromegaly present.  Cardiovascular: Normal rate, regular rhythm and intact distal pulses.   Respiratory: Effort normal and breath sounds normal. No respiratory distress.  GI: Soft. Bowel sounds are normal. She exhibits no distension. There is no tenderness.  Musculoskeletal: She exhibits no edema.  Is able to move right  upper extremity Limited movement in her lower extremities  and left upper extremity  Neurological: alert and oriented X 3   .  Skin: Skin is warm and dry. She is  not diaphoretic.    ASSESSMENT/ PLAN:  1. MS: is slowly  declining   Has been  followed by  neurology;  Has paraplegia.will continue amantadine 100 mg daily baclofen 20 mg tid prn  for muscle spasms;      2. Hypertension: will continue coreg 25 mg twice daily and will monitor   3. Chronic combined systolic and diastolic heart failure:EF is 60-65%(01-12-15)    is stable will continue demadex 20  mg daily; with k+ 20 meq daily; will continue aldactone 25 mg daily;    4. Neurogenic bladder: takes flomax 0.4 mg daily  myrbetriq 50 mg daily  Will I/O cath every 6 hours as needed   5. Depression: is presently taking cymbalta 60 mg daily; which also helps with her pain management.  rozerem 6 mg nightly    6. Chronic pain:   will continue  neurontin 700 mg twice  daily; takes cymbalta 60 mg daily  Will give her tylenol 650 mg three times daily routinely   9. Constipation: will continue  miralax  daily as needed and senna s twice daily   10. Gerd: will continue pepcid 20 mg twice daily   11. Glaucoma: will continue xalatan to both eyes.    12. Obesity: will refer her to the bariatric center for possible bariatric sleeve. I seriously doubt is she is appropriate for the surgical procedure.   13. Addison disease:  Will check cortisol level   Will check lipids    Ok Edwards NP [facility]  Contact [phone] Monday through Friday 8am- 5pm  After hours call [phone]

## 2016-08-09 DIAGNOSIS — G35 Multiple sclerosis: Secondary | ICD-10-CM | POA: Diagnosis not present

## 2016-08-09 DIAGNOSIS — E785 Hyperlipidemia, unspecified: Secondary | ICD-10-CM | POA: Diagnosis not present

## 2016-08-09 LAB — LIPID PANEL
CHOLESTEROL: 159 mg/dL (ref 0–200)
HDL: 28 mg/dL — AB (ref 35–70)
LDL Cholesterol: 109 mg/dL
Triglycerides: 109 mg/dL (ref 40–160)

## 2016-08-10 DIAGNOSIS — G35 Multiple sclerosis: Secondary | ICD-10-CM | POA: Diagnosis not present

## 2016-08-10 DIAGNOSIS — R1311 Dysphagia, oral phase: Secondary | ICD-10-CM | POA: Diagnosis not present

## 2016-08-10 DIAGNOSIS — R531 Weakness: Secondary | ICD-10-CM | POA: Diagnosis not present

## 2016-08-13 DIAGNOSIS — R1311 Dysphagia, oral phase: Secondary | ICD-10-CM | POA: Diagnosis not present

## 2016-08-13 DIAGNOSIS — R531 Weakness: Secondary | ICD-10-CM | POA: Diagnosis not present

## 2016-08-13 DIAGNOSIS — G35 Multiple sclerosis: Secondary | ICD-10-CM | POA: Diagnosis not present

## 2016-08-15 DIAGNOSIS — R531 Weakness: Secondary | ICD-10-CM | POA: Diagnosis not present

## 2016-08-15 DIAGNOSIS — R1311 Dysphagia, oral phase: Secondary | ICD-10-CM | POA: Diagnosis not present

## 2016-08-15 DIAGNOSIS — G35 Multiple sclerosis: Secondary | ICD-10-CM | POA: Diagnosis not present

## 2016-08-17 DIAGNOSIS — R1311 Dysphagia, oral phase: Secondary | ICD-10-CM | POA: Diagnosis not present

## 2016-08-17 DIAGNOSIS — R531 Weakness: Secondary | ICD-10-CM | POA: Diagnosis not present

## 2016-08-17 DIAGNOSIS — G35 Multiple sclerosis: Secondary | ICD-10-CM | POA: Diagnosis not present

## 2016-08-18 DIAGNOSIS — R531 Weakness: Secondary | ICD-10-CM | POA: Diagnosis not present

## 2016-08-18 DIAGNOSIS — G35 Multiple sclerosis: Secondary | ICD-10-CM | POA: Diagnosis not present

## 2016-08-18 DIAGNOSIS — R1311 Dysphagia, oral phase: Secondary | ICD-10-CM | POA: Diagnosis not present

## 2016-08-23 DIAGNOSIS — G35 Multiple sclerosis: Secondary | ICD-10-CM | POA: Diagnosis not present

## 2016-08-23 DIAGNOSIS — R1311 Dysphagia, oral phase: Secondary | ICD-10-CM | POA: Diagnosis not present

## 2016-08-23 DIAGNOSIS — R531 Weakness: Secondary | ICD-10-CM | POA: Diagnosis not present

## 2016-08-29 ENCOUNTER — Non-Acute Institutional Stay (SKILLED_NURSING_FACILITY): Payer: Medicare Other | Admitting: Adult Health

## 2016-08-29 ENCOUNTER — Encounter: Payer: Self-pay | Admitting: Adult Health

## 2016-08-29 DIAGNOSIS — I5042 Chronic combined systolic (congestive) and diastolic (congestive) heart failure: Secondary | ICD-10-CM | POA: Diagnosis not present

## 2016-08-29 DIAGNOSIS — I110 Hypertensive heart disease with heart failure: Secondary | ICD-10-CM | POA: Diagnosis not present

## 2016-08-29 DIAGNOSIS — G35 Multiple sclerosis: Secondary | ICD-10-CM

## 2016-08-29 DIAGNOSIS — G894 Chronic pain syndrome: Secondary | ICD-10-CM | POA: Diagnosis not present

## 2016-08-29 DIAGNOSIS — G629 Polyneuropathy, unspecified: Secondary | ICD-10-CM | POA: Diagnosis not present

## 2016-08-29 DIAGNOSIS — D649 Anemia, unspecified: Secondary | ICD-10-CM

## 2016-08-29 DIAGNOSIS — G8220 Paraplegia, unspecified: Secondary | ICD-10-CM

## 2016-08-29 DIAGNOSIS — E271 Primary adrenocortical insufficiency: Secondary | ICD-10-CM | POA: Diagnosis not present

## 2016-08-29 NOTE — Progress Notes (Signed)
Location:   [place] Room Number: [place] of Service:  SNF (31)   CODE STATUS: DNR  Allergies  Allergen Reactions  . Sulfonamide Derivatives Hives and Shortness Of Breath  . Penicillins Hives and Itching    Chief Complaint  Patient presents with  . Medical Management of Chronic Issues    Routine Visit    HPI:  She is a long term resident of this facility being seen for the management of her chronic illnesses. Overall there is little change in her status. She is complaining of dysuria and feels as though her bladder is not emptying completely. There are no nursing concerns at this time.   Past Medical History:  Diagnosis Date  . Cardiomyopathy ([place])    a.  Echo 04/29/12: Mild LVH, EF 20-25%, mild AI, moderate MR, moderate LAE, mild RAE, mild RVE, moderate TR, PASP 51, small pericardial effusion;   b. probably non-ischemic given multiple chemo-Tx agents used for MS and global LV dysfn on echo  . Chronic systolic heart failure ([place])   . Depression   . Glaucoma   . Hypertension   . MS (multiple sclerosis) ([place])    a. Dx'd late 20's. b. Tx with Novantrone, Tysabri, Copaxone previously.    Past Surgical History:  Procedure Laterality Date  . ABLATION     uterine  . CESAREAN SECTION      Social History   Social History  . Marital status: Single    Spouse name: N/A  . Number of children: N/A  . Years of education: N/A   Occupational History  . Not on file.   Social History Main Topics  . Smoking status: Former Research scientist (life sciences)  . Smokeless tobacco: Never Used  . Alcohol use No  . Drug use: No  . Sexual activity: Not Currently   Other Topics Concern  . Not on file   Social History Narrative  . No narrative on file   Family History  Problem Relation Age of Onset  . Hypertension Mother   . Cancer Mother     breast   . Cancer Father     prostate  . Multiple sclerosis Sister   . Heart attack Neg Hx       VITAL SIGNS BP 132/78   Pulse 88    Temp 98.3 F (36.8 C)   Resp 18   Ht 5\' 5"  (1.651 m)   Wt 236 lb 1.6 oz (107.1 kg)   SpO2 98%   BMI 39.29 kg/m   Patient's Medications  New Prescriptions   No medications on file  Previous Medications   ACETAMINOPHEN (TYLENOL) 325 MG TABLET    Take 650 mg by mouth 3 (three) times daily.   ALBUTEROL (PROVENTIL) (2.5 MG/3ML) 0.083% NEBULIZER SOLUTION    Take 3 mLs (2.5 mg total) by nebulization every 2 (two) hours as needed for wheezing.   AMANTADINE (SYMMETREL) 100 MG CAPSULE    Take 100 mg by mouth daily.    BACLOFEN (LIORESAL) 20 MG TABLET    Take 1 tablet (20 mg total) by mouth 3 (three) times daily as needed for muscle spasms.   CARVEDILOL (COREG) 25 MG TABLET    Take 1 tablet (25 mg total) by mouth 2 (two) times daily with a meal.   CHOLECALCIFEROL (VITAMIN D-3 PO)    Take 1,000 Int'l Units/day by mouth daily.    CRANBERRY PO    Take 100 mg by mouth daily.   DULOXETINE (CYMBALTA) 60 MG CAPSULE  Take 60 mg by mouth daily.   FAMOTIDINE (PEPCID) 20 MG TABLET    Take 1 tablet (20 mg total) by mouth 2 (two) times daily.   GABAPENTIN (NEURONTIN) 100 MG CAPSULE    Take 7 capsules (700 mg total) by mouth 2 (two) times daily.   HYDROCORTISONE CREAM 0.5 %    Apply 1 application topically 2 (two) times daily.   LACTASE (LACTAID PO)    Take 1 tablet by mouth daily.   LACTULOSE 20 GM/30ML SOLN    Take 30 mLs by mouth daily.   LATANOPROST (XALATAN) 0.005 % OPHTHALMIC SOLUTION    Place 1 drop into both eyes at bedtime.   MAGNESIUM HYDROXIDE (MILK OF MAGNESIA) 400 MG/5ML SUSPENSION    Take 30 mLs by mouth every 8 (eight) hours as needed for mild constipation or moderate constipation.   MENTHOL, TOPICAL ANALGESIC, 154 MG PADS    Icy Hot Patch: Apply to left shoulder topically every 8 hours as needed for pain.   MIRABEGRON ER (MYRBETRIQ) 25 MG TB24 TABLET    Take 50 mg by mouth at bedtime.    ONDANSETRON (ZOFRAN) 4 MG TABLET    Take 1 tablet (4 mg total) by mouth every 6 (six) hours as needed for  nausea.   POLYETHYLENE GLYCOL (MIRALAX / GLYCOLAX) PACKET    Take 17 g by mouth daily as needed for mild constipation.   POTASSIUM CHLORIDE (KLOR-CON) 20 MEQ PACKET    Take 20 mEq by mouth daily.    PROPYLENE GLYCOL (SYSTANE BALANCE) 0.6 % SOLN    Place 1 drop into both eyes 2 (two) times daily.   RAMELTEON (ROZEREM) 8 MG TABLET    Take 8 mg by mouth at bedtime.   SENNA-DOCUSATE (SENOKOT S) 8.6-50 MG TABLET    Take 1 tablet by mouth 2 (two) times daily.   SIMETHICONE PO    Take 40 mg by mouth every 6 (six) hours.   SPIRONOLACTONE (ALDACTONE) 25 MG TABLET    Take 25 mg by mouth daily. For CHF   TAMSULOSIN (FLOMAX) 0.4 MG CAPS CAPSULE    Take 0.4 mg by mouth daily. For incontinence   TORSEMIDE (DEMADEX) 20 MG TABLET    Take 20 mg by mouth daily.  Modified Medications   No medications on file  Discontinued Medications   ACETAMINOPHEN (TYLENOL) 325 MG TABLET    Take 2 tablets (650 mg total) by mouth every 6 (six) hours as needed for mild pain or fever (or Fever >/= 101).     SIGNIFICANT DIAGNOSTIC EXAMS  10-08-14: MRI brain: No significant change in diffuse white matter lesions consistent with patient's history of multiple sclerosis. None of these areas demonstrate abnormal restriction or enhancement as may be seen with areas of acute demyelination. Global atrophy. 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 Right parotid 1 cm lesion does not have typical appearance of an intra parotid lymph node and therefore primary parotid lesion is a possibility. ENT consultation may be considered for further delineation.  10-08-14: mri of cervical; spine: Scattered new areas of altered signal intensity (compared to 2007) throughout the cervical cord as detailed above consistent with patient's history of multiple sclerosis. None of these areas demonstrate enhancement as may be seen with areas of active demyelination. Cervical spondylotic changes as  detailed above most prominent C5-6 level.  12-13-14: ABI: right: mild atherosclerotic disease   01-10-15: chest x-ray: No  active disease  01-12-15: 2-d echo: Left ventricle: The cavity size was normal. There was mild concentric hypertrophy. Systolic function was normal. The estimated ejection fraction was in the range of 60% to 65%. Wall motion was normal; there were no regional wall motion abnormalities. Doppler parameters are consistent with abnormal left ventricular relaxation (grade 1 diastolic dysfunction). - Aortic valve: There was mild regurgitation. - Mitral valve: Calcified annulus. There was mild regurgitation.  05-30-15: chest x-ray: No active disease.  07-14-15: EEG: This EEG is consistent with a mild generalized non-specific cerebral dysfunction(encephalopathy).   07-20-15: chest x-ray; Interval extubation. Decreasing lung volumes with increasing bibasilar opacities, likely atelectasis.  11-07-15: ct of head; No acute intracranial abnormality. White matter changes are suggestive for chronic small vessel ischemic disease. Right maxillary sinus disease.     LABS REVIEWED:   08-19-15: wbc 9.9; hgb 10.6; hct 33.2; mcv 81.8; plt 400 glucose 105; bun 11.0; creat 1.13; k+ 4.1; na++141; liver normal albumin 3.7  urine culture: proteus mirabilis: cipro  11-16-15: wbc 8.2; hgb 9.4; hct 30.9; mcv 82.5; plt 249 glucose 89; bun 15.0; creat 0.75; k+ 3.9; na++ 141; liver normal albumin 3.5  03-03-16: wbc 5.3; hgb 10.0; hct 32.6; mcv 81.9; plt 242; glucose 149; bun 15.2; creat 1.26; k+ 3.4; na++ 143; ast 140; albumin 3.9; vit B 12: >2000 03-23-16: glucose 94; bun 18.5; creat 1.42; k+ 4.1; na++ 142     Review of Systems  Constitutional: Negative for malaise/fatigue.  Respiratory: Negative for cough and shortness of breath.   Cardiovascular: Negative for chest pain, palpitations and leg swelling.  Gastrointestinal: Negative for heartburn, abdominal pain and constipation.  Feels as though she  cannot empty her bladder has dysuria  Musculoskeletal: has generalized aches and pains.   Skin: Negative.   Neurological: Negative for dizziness.  Psychiatric/Behavioral: does have depression and insomnia.      Physical Exam Constitutional: . No distress.  Obese   Neck: Neck supple. No JVD present. No thyromegaly present.  Cardiovascular: Normal rate, regular rhythm and intact distal pulses.   Respiratory: Effort normal and breath sounds normal. No respiratory distress.  GI: Soft. Bowel sounds are normal. She exhibits no distension. There is no tenderness.  Musculoskeletal: She exhibits no edema.  Is able to move right  upper extremity Limited movement in her lower extremities  and left upper extremity  Neurological: alert and oriented X 3   .  Skin: Skin is warm and dry. She is not diaphoretic.    ASSESSMENT/ PLAN:  1. MS: is slowly declining   Has been  followed by  neurology;  Has paraplegia.will continue amantadine 100 mg daily baclofen 20 mg tid prn  for muscle spasms;      2. Hypertension: will continue coreg 25 mg twice daily and will monitor   3. Chronic combined systolic and diastolic heart failure:EF is 60-65%(01-12-15)    is stable will continue demadex 20  mg daily; with k+ 20 meq daily; will continue aldactone 25 mg daily;    4. Neurogenic bladder: takes flomax 0.4 mg daily  myrbetriq 50 mg daily  Will I/O cath every 6 hours as needed   5. Depression: is presently taking cymbalta 60 mg daily; which also helps with her pain management.  rozerem 6 mg nightly    6. Chronic pain:   will continue  neurontin 700 mg twice  daily; takes cymbalta 60 mg daily  Will give her tylenol 650 mg three times daily routinely   9. Constipation: will  continue  miralax  daily as needed and senna s twice daily   10. Gerd: will continue pepcid 20 mg twice daily   11. Glaucoma: will continue xalatan to both eyes.    12. Obesity: will refer her to the bariatric center for possible  bariatric sleeve. I seriously doubt is she is appropriate for the surgical procedure.   13. Addison disease:  Will monitor  Will get ua/c&s; will I/O every 6 hours as needed and will infection as indicated.   MD is aware of resident's narcotic use and is in agreement with current plan of care. We will attempt to wean resident as apropriate     Ok Edwards NP [facility]  Contact [phone] Monday through Friday 8am- 5pm  After hours call [phone]

## 2016-09-07 ENCOUNTER — Encounter: Payer: Self-pay | Admitting: Adult Health

## 2016-09-07 ENCOUNTER — Non-Acute Institutional Stay (SKILLED_NURSING_FACILITY): Payer: Medicare Other | Admitting: Adult Health

## 2016-09-07 DIAGNOSIS — IMO0001 Reserved for inherently not codable concepts without codable children: Secondary | ICD-10-CM

## 2016-09-07 DIAGNOSIS — G8220 Paraplegia, unspecified: Secondary | ICD-10-CM | POA: Diagnosis not present

## 2016-09-07 DIAGNOSIS — I5042 Chronic combined systolic (congestive) and diastolic (congestive) heart failure: Secondary | ICD-10-CM

## 2016-09-07 DIAGNOSIS — G35 Multiple sclerosis: Secondary | ICD-10-CM

## 2016-09-07 DIAGNOSIS — E876 Hypokalemia: Secondary | ICD-10-CM | POA: Diagnosis not present

## 2016-09-07 DIAGNOSIS — D649 Anemia, unspecified: Secondary | ICD-10-CM | POA: Diagnosis not present

## 2016-09-07 DIAGNOSIS — I110 Hypertensive heart disease with heart failure: Secondary | ICD-10-CM | POA: Diagnosis not present

## 2016-09-07 DIAGNOSIS — K5909 Other constipation: Secondary | ICD-10-CM | POA: Diagnosis not present

## 2016-09-07 DIAGNOSIS — N319 Neuromuscular dysfunction of bladder, unspecified: Secondary | ICD-10-CM | POA: Diagnosis not present

## 2016-09-07 DIAGNOSIS — G894 Chronic pain syndrome: Secondary | ICD-10-CM

## 2016-09-07 DIAGNOSIS — Z6839 Body mass index (BMI) 39.0-39.9, adult: Secondary | ICD-10-CM | POA: Diagnosis not present

## 2016-09-07 DIAGNOSIS — K219 Gastro-esophageal reflux disease without esophagitis: Secondary | ICD-10-CM | POA: Diagnosis not present

## 2016-09-07 DIAGNOSIS — E6609 Other obesity due to excess calories: Secondary | ICD-10-CM

## 2016-09-07 NOTE — Progress Notes (Signed)
Location:   [facility] Room Number: [place] of Service:  SNF (31)   CODE STATUS: DNR  Allergies  Allergen Reactions  . Sulfonamide Derivatives Hives and Shortness Of Breath  . Penicillins Hives and Itching    Has patient had a PCN reaction causing immediate rash, facial/tongue/throat swelling, SOB or lightheadedness with hypotension: unknown Has patient had a PCN reaction causing severe rash involving mucus membranes or skin necrosis: unknown Has patient had a PCN reaction that required hospitalization: unknown Has patient had a PCN reaction occurring within the last 10 years: unknown If all of the above answers are "NO", then may proceed with Cephalosporin use. Prior course of rocephin charted 05/2015    Chief Complaint  Patient presents with  . Medical Management of Chronic Issues    Routine Visit    HPI:  She is a long term resident of this facility being seen for the management of her chronic illnesses. Her status is without significant change. She is complaining again of a vaginal yeast infection. There are no nursing concerns at this time.    Past Medical History:  Diagnosis Date  . Cardiomyopathy ([place])    a.  Echo 04/29/12: Mild LVH, EF 20-25%, mild AI, moderate MR, moderate LAE, mild RAE, mild RVE, moderate TR, PASP 51, small pericardial effusion;   b. probably non-ischemic given multiple chemo-Tx agents used for MS and global LV dysfn on echo  . Chronic systolic heart failure ([place])   . Depression   . Glaucoma   . Hypertension   . MS (multiple sclerosis) ([place])    a. Dx'd late 20's. b. Tx with Novantrone, Tysabri, Copaxone previously.    Past Surgical History:  Procedure Laterality Date  . ABLATION     uterine  . CESAREAN SECTION      Social History   Social History  . Marital status: Single    Spouse name: N/A  . Number of children: N/A  . Years of education: N/A   Occupational History  . Not on file.   Social History Main Topics    . Smoking status: Former Research scientist (life sciences)  . Smokeless tobacco: Never Used  . Alcohol use No  . Drug use: No  . Sexual activity: Not Currently   Other Topics Concern  . Not on file   Social History Narrative  . No narrative on file   Family History  Problem Relation Age of Onset  . Hypertension Mother   . Cancer Mother     breast   . Cancer Father     prostate  . Multiple sclerosis Sister   . Heart attack Neg Hx       VITAL SIGNS BP 132/78   Pulse 88   Temp 98.3 F (36.8 C)   Resp 18   Ht 5\' 5"  (1.651 m)   Wt 236 lb (107 kg)   SpO2 98%   BMI 39.27 kg/m   Patient's Medications  New Prescriptions   No medications on file  Previous Medications   ACETAMINOPHEN (TYLENOL) 325 MG TABLET    Take 650 mg by mouth 3 (three) times daily.   ALBUTEROL (PROVENTIL) (2.5 MG/3ML) 0.083% NEBULIZER SOLUTION    Take 3 mLs (2.5 mg total) by nebulization every 2 (two) hours as needed for wheezing.   AMANTADINE (SYMMETREL) 100 MG CAPSULE    Take 100 mg by mouth daily.    BACLOFEN (LIORESAL) 20 MG TABLET    Take 1 tablet (20 mg total) by mouth  3 (three) times daily as needed for muscle spasms.   CARVEDILOL (COREG) 25 MG TABLET    Take 1 tablet (25 mg total) by mouth 2 (two) times daily with a meal.   CHOLECALCIFEROL (VITAMIN D-3 PO)    Take 1,000 Int'l Units/day by mouth daily.    CRANBERRY PO    Take 100 mg by mouth daily.   DULOXETINE (CYMBALTA) 60 MG CAPSULE    Take 60 mg by mouth daily.   FAMOTIDINE (PEPCID) 20 MG TABLET    Take 1 tablet (20 mg total) by mouth 2 (two) times daily.   GABAPENTIN (NEURONTIN) 100 MG CAPSULE    Take 7 capsules (700 mg total) by mouth 2 (two) times daily.   HYDROCORTISONE CREAM 0.5 %    Apply 1 application topically 2 (two) times daily.   LACTASE (LACTAID PO)    Take 1 tablet by mouth daily.   LACTULOSE 20 GM/30ML SOLN    Take 30 mLs by mouth daily.   LATANOPROST (XALATAN) 0.005 % OPHTHALMIC SOLUTION    Place 1 drop into both eyes at bedtime.   MAGNESIUM  HYDROXIDE (MILK OF MAGNESIA) 400 MG/5ML SUSPENSION    Take 30 mLs by mouth every 8 (eight) hours as needed for mild constipation or moderate constipation.   MENTHOL, TOPICAL ANALGESIC, 154 MG PADS    Icy Hot Patch: Apply to left shoulder topically every 8 hours as needed for pain.   MIRABEGRON ER (MYRBETRIQ) 25 MG TB24 TABLET    Take 50 mg by mouth at bedtime.    ONDANSETRON (ZOFRAN) 4 MG TABLET    Take 1 tablet (4 mg total) by mouth every 6 (six) hours as needed for nausea.   POLYETHYLENE GLYCOL (MIRALAX / GLYCOLAX) PACKET    Take 17 g by mouth daily as needed for mild constipation.   POTASSIUM CHLORIDE (KLOR-CON) 20 MEQ PACKET    Take 20 mEq by mouth daily.    PROPYLENE GLYCOL (SYSTANE BALANCE) 0.6 % SOLN    Place 1 drop into both eyes 2 (two) times daily.   RAMELTEON (ROZEREM) 8 MG TABLET    Take 8 mg by mouth at bedtime.   SENNA-DOCUSATE (SENOKOT S) 8.6-50 MG TABLET    Take 1 tablet by mouth 2 (two) times daily.   SIMETHICONE PO    Take 40 mg by mouth every 6 (six) hours.   SPIRONOLACTONE (ALDACTONE) 25 MG TABLET    Take 25 mg by mouth daily. For CHF   TAMSULOSIN (FLOMAX) 0.4 MG CAPS CAPSULE    Take 0.4 mg by mouth daily. For incontinence   TORSEMIDE (DEMADEX) 20 MG TABLET    Take 20 mg by mouth daily.  Modified Medications   No medications on file  Discontinued Medications   ACETAMINOPHEN (TYLENOL) 325 MG TABLET    Take 2 tablets (650 mg total) by mouth every 6 (six) hours as needed for mild pain or fever (or Fever >/= 101).     SIGNIFICANT DIAGNOSTIC EXAMS  10-08-14: MRI brain: No significant change in diffuse white matter lesions consistent with patient's history of multiple sclerosis. None of these areas demonstrate abnormal restriction or enhancement as may be seen with areas of acute demyelination. Global atrophy. 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 Right parotid 1 cm lesion does not have typical appearance of an  intra parotid lymph node and therefore primary parotid lesion is a possibility. ENT consultation  may be considered for further delineation.  10-08-14: mri of cervical; spine: Scattered new areas of altered signal intensity (compared to 2007) throughout the cervical cord as detailed above consistent with patient's history of multiple sclerosis. None of these areas demonstrate enhancement as may be seen with areas of active demyelination. Cervical spondylotic changes as detailed above most prominent C5-6 level.  12-13-14: ABI: right: mild atherosclerotic disease   01-10-15: ct of head: No acute intracranial abnormalities. Mild atrophy. White matter disease compatible with history of MS.  01-10-15: chest x-ray: No active disease  01-12-15: 2-d echo: Left ventricle: The cavity size was normal. There was mild concentric hypertrophy. Systolic function was normal. The estimated ejection fraction was in the range of 60% to 65%. Wall motion was normal; there were no regional wall motion abnormalities. Doppler parameters are consistent with abnormal left ventricular relaxation (grade 1 diastolic dysfunction). - Aortic valve: There was mild regurgitation. - Mitral valve: Calcified annulus. There was mild regurgitation.  05-30-15: chest x-ray: No active disease.  07-14-15: EEG: This EEG is consistent with a mild generalized non-specific cerebral dysfunction(encephalopathy).   07-20-15: chest x-ray; Interval extubation. Decreasing lung volumes with increasing bibasilar opacities, likely atelectasis.  11-07-15: ct of head; No acute intracranial abnormality. White matter changes are suggestive for chronic small vessel ischemic disease. Right maxillary sinus disease.     LABS REVIEWED:   11-16-15: wbc 8.2; hgb 9.4; hct 30.9; mcv 82.5; plt 249 glucose 89; bun 15.0; creat 0.75; k+ 3.9; na++ 141; liver normal albumin 3.5  03-03-16: wbc 5.3; hgb 10.0; hct 32.6; mcv 81.9; plt 242; glucose 149; bun 15.2; creat 1.26; k+  3.4; na++ 143; ast 140; albumin 3.9; vit B 12: >2000 03-23-16: glucose 94; bun 18.5; creat 1.42; k+ 4.1; na++ 142     Review of Systems  Constitutional: Negative for malaise/fatigue.  Respiratory: Negative for cough and shortness of breath.   Cardiovascular: Negative for chest pain, palpitations and leg swelling.  Gastrointestinal: Negative for heartburn, abdominal pain and constipation.  Feels as though she cannot empty her bladder Complaining of yeast infection  Musculoskeletal: has generalized aches and pains.   Skin: Negative.   Neurological: Negative for dizziness.  Psychiatric/Behavioral: does have depression and insomnia.      Physical Exam Constitutional: . No distress.  Obese   Neck: Neck supple. No JVD present. No thyromegaly present.  Cardiovascular: Normal rate, regular rhythm and intact distal pulses.   Respiratory: Effort normal and breath sounds normal. No respiratory distress.  GI: Soft. Bowel sounds are normal. She exhibits no distension. There is no tenderness.  Musculoskeletal: She exhibits no edema.  Is able to move right  upper extremity Limited movement in her lower extremities  and left upper extremity  Neurological: alert and oriented X 3   .  Skin: Skin is warm and dry. She is not diaphoretic.    ASSESSMENT/ PLAN:  1. MS: is slowly declining   Has been  followed by  neurology;  Has paraplegia.will continue amantadine 100 mg daily baclofen 20 mg tid prn  for muscle spasms;      2. Hypertension: will continue coreg 25 mg twice daily and will monitor   3. Chronic combined systolic and diastolic heart failure:EF is 60-65%(01-12-15)    is stable will continue demadex 20  mg daily; with k+ 20 meq daily; will continue aldactone 25 mg daily;    4. Neurogenic bladder: takes flomax 0.4 mg daily  myrbetriq 50 mg daily   I/O cath every 6  hours as needed   5. Depression: is presently taking cymbalta 60 mg daily; which also helps with her pain management.  rozerem  6 mg nightly    6. Chronic pain:   will continue  neurontin 700 mg twice  daily; takes cymbalta 60 mg daily   tylenol 650 mg three times daily routinely   9. Constipation: will continue  miralax  daily as needed and senna s twice daily   10. Gerd: will continue pepcid 20 mg twice daily   11. Glaucoma: will continue xalatan to both eyes.    12. Obesity: awaiting bariatric consult   13. Addison disease:  Will check cortisol level   For her vaginal yeast infections will setup gyn consult Screening mammogram Cbc; cmp; lipids; fbot    MD is aware of resident's narcotic use and is in agreement with current plan of care. We will attempt to wean resident as apropriate   Ok Edwards NP [facility]  Contact [phone] Monday through Friday 8am- 5pm  After hours call [phone]

## 2016-09-10 DIAGNOSIS — G35 Multiple sclerosis: Secondary | ICD-10-CM | POA: Diagnosis not present

## 2016-09-10 LAB — LIPID PANEL
Cholesterol: 200 mg/dL (ref 0–200)
HDL: 29 mg/dL — AB (ref 35–70)
LDL Cholesterol: 142 mg/dL
TRIGLYCERIDES: 144 mg/dL (ref 40–160)

## 2016-09-10 LAB — CBC AND DIFFERENTIAL
HCT: 33 % — AB (ref 36–46)
Hemoglobin: 10.5 g/dL — AB (ref 12.0–16.0)
PLATELETS: 253 10*3/uL (ref 150–399)
WBC: 4.9 10^3/mL

## 2016-09-10 LAB — HEPATIC FUNCTION PANEL
ALK PHOS: 139 U/L — AB (ref 25–125)
ALT: 17 U/L (ref 7–35)
AST: 16 U/L (ref 13–35)
Bilirubin, Total: 0.2 mg/dL

## 2016-09-10 LAB — BASIC METABOLIC PANEL
BUN: 14 mg/dL (ref 4–21)
CREATININE: 1.1 mg/dL (ref 0.5–1.1)
Glucose: 164 mg/dL
Potassium: 3.8 mmol/L (ref 3.4–5.3)
Sodium: 143 mmol/L (ref 137–147)

## 2016-09-20 LAB — HM MAMMOGRAPHY

## 2016-09-21 ENCOUNTER — Encounter: Payer: Self-pay | Admitting: *Deleted

## 2016-09-21 NOTE — Progress Notes (Unsigned)
Received Mammogram from [facility] [phone]. Faxed to [facility].

## 2016-09-25 DIAGNOSIS — R1311 Dysphagia, oral phase: Secondary | ICD-10-CM | POA: Diagnosis not present

## 2016-09-25 DIAGNOSIS — R531 Weakness: Secondary | ICD-10-CM | POA: Diagnosis not present

## 2016-09-25 DIAGNOSIS — G35 Multiple sclerosis: Secondary | ICD-10-CM | POA: Diagnosis not present

## 2016-09-26 ENCOUNTER — Ambulatory Visit (INDEPENDENT_AMBULATORY_CARE_PROVIDER_SITE_OTHER): Payer: Medicare Other | Admitting: Neurology

## 2016-09-26 ENCOUNTER — Encounter: Payer: Self-pay | Admitting: Neurology

## 2016-09-26 VITALS — BP 128/81 | HR 74 | Resp 20 | Ht 65.0 in | Wt 236.0 lb

## 2016-09-26 DIAGNOSIS — F32A Depression, unspecified: Secondary | ICD-10-CM

## 2016-09-26 DIAGNOSIS — N319 Neuromuscular dysfunction of bladder, unspecified: Secondary | ICD-10-CM | POA: Diagnosis not present

## 2016-09-26 DIAGNOSIS — R531 Weakness: Secondary | ICD-10-CM | POA: Diagnosis not present

## 2016-09-26 DIAGNOSIS — Z6839 Body mass index (BMI) 39.0-39.9, adult: Secondary | ICD-10-CM

## 2016-09-26 DIAGNOSIS — F329 Major depressive disorder, single episode, unspecified: Secondary | ICD-10-CM

## 2016-09-26 DIAGNOSIS — G4700 Insomnia, unspecified: Secondary | ICD-10-CM | POA: Diagnosis not present

## 2016-09-26 DIAGNOSIS — G35 Multiple sclerosis: Secondary | ICD-10-CM | POA: Diagnosis not present

## 2016-09-26 DIAGNOSIS — IMO0001 Reserved for inherently not codable concepts without codable children: Secondary | ICD-10-CM

## 2016-09-26 DIAGNOSIS — G8220 Paraplegia, unspecified: Secondary | ICD-10-CM

## 2016-09-26 DIAGNOSIS — R1311 Dysphagia, oral phase: Secondary | ICD-10-CM | POA: Diagnosis not present

## 2016-09-26 DIAGNOSIS — E6609 Other obesity due to excess calories: Secondary | ICD-10-CM

## 2016-09-26 MED ORDER — ZOLPIDEM TARTRATE 5 MG PO TABS: 5.0000 mg | ORAL_TABLET | Freq: Every evening | ORAL | 5 refills | Status: DC | PRN

## 2016-09-26 NOTE — Progress Notes (Signed)
[facility]  PATIENT: Shannon Garrett DOB: [birth year]  REFERRING DOCTOR OR PCP:  Gildardo Cranker SOURCE: Patient, records from EMR, MRI images on PACS.  _________________________________   HISTORICAL  CHIEF COMPLAINT:  Chief Complaint  Patient presents with  . Multiple Sclerosis    Has had parts A and B of first Ocrelizumab infusion.  Sts. she believes she is stronger and has more ROM in extremities  Today would like to discuss difficulty sleeping, increased generalized pain/fim    HISTORY OF PRESENT ILLNESS:  Shannon Garrett is a 56 year old woman with multiple sclerosis.      MS:   She had the first split dose of Ocrelizumab in October.   She tolerated it well and there were no complications.   She feels she may be a little bit stronger.   Recent bloodwork in EMR.     She is feeling weaker in her left arm and legs are about the same.    She is on Tysabri and tolerates it well. However, she has missed some doses due to a hospitalization for UTI and NH not bringing her to last scheduled infusion.     Gait/strength/sensation: She is unable to walk and uses a wheelchair much of the day.  She cannot transfer but can assist.   Her left leg is weaker than her right.   She also has milder weakness in her hands, left > right .   She has left > right, lefg > arm spasticity.    She denies  numbness but occasionally will get some tingling in the left arm and leg.   She has spasticity, helped by baclofen a little bit.      Bladder/bowel:   She notes bladder spasms that are only partially helped by Myrbetriq. She has some retention and has some benefit from tamsulosin.  and has had more UTI's lately.   She has urinary incontinence at times and wears diapers at her current nursing home.   She has bad constipation.     Miralax has nothelped  Vision: She denies any major problem with her vision.  Fatigue/sleep: She has physical and cognitive fatigue daily.   She denies excessive  sleepiness..   Her sleep onset insomnia is worse than sleep maintenance.    Ambien helped her sleep a lot more than ramelteon.  Mood/cognition: She notes depression.  She gets irritable and gets frustrated easily.    This is worse if she sleeps poorly.   She denies anxiety.  She sometimes has crying spells.   She is on Cymbalta and tolerates it well.   She denies any major problems with cognition but feels she was able to think and process information better in the past.  Pain:  She reports pain in all 4 limbs and in her back. The pain is worse in the left arm.Percocet helped her in the past.    MS history:     She was diagnosed with multiple sclerosis in the 1980s after presenting with optic neuritis, numbness in her legs and vertigo MRI was consistent with MS. Initially she was not placed on any medication as none were approved. She had multiple exacerbations and by the 1990s was having difficulty with her gait initially she was placed on Avonex and remain on that medication for many years.   She thinks I first saw her about 8 years ago.   She thinks she was switched to Tysabri but continued to have some progression. Therefore, Novantrone was tried.  She feels that Novantrone did slow her progression down significantly. However, this needed to be stopped after she developed congestive heart failure.  She switched her care to Dr. Jacqulynn Cadet and went back on Tysabri therapy but stopped seeing him when he moved to the [place] area about 1-1/2 years ago.       REVIEW OF SYSTEMS: Constitutional: No fevers, chills, sweats, or change in appetite.  Notes fatigue and insomnia Eyes: No visual changes, double vision, eye pain Ear, nose and throat: No hearing loss, ear pain, nasal congestion, sore throat Cardiovascular: No chest pain, palpitations Respiratory: No shortness of breath at rest or with exertion.   No wheezes GastrointestinaI: No nausea, vomiting, diarrhea, abdominal pain, fecal  incontinence Genitourinary: as above Musculoskeletal: Some neck pain, back pain Integumentary: No rash, pruritus, skin lesions Neurological: as above Psychiatric: as above Endocrine: No palpitations, diaphoresis, change in appetite, change in weigh or increased thirst Hematologic/Lymphatic: No anemia, purpura, petechiae. Allergic/Immunologic: No itchy/runny eyes, nasal congestion, recent allergic reactions, rashes  ALLERGIES: Allergies  Allergen Reactions  . Sulfonamide Derivatives Hives and Shortness Of Breath  . Penicillins Hives and Itching    Has patient had a PCN reaction causing immediate rash, facial/tongue/throat swelling, SOB or lightheadedness with hypotension: unknown Has patient had a PCN reaction causing severe rash involving mucus membranes or skin necrosis: unknown Has patient had a PCN reaction that required hospitalization: unknown Has patient had a PCN reaction occurring within the last 10 years: unknown If all of the above answers are "NO", then may proceed with Cephalosporin use. Prior course of rocephin charted 05/2015    HOME MEDICATIONS:  Current Outpatient Prescriptions:  .  acetaminophen (TYLENOL) 325 MG tablet, Take 650 mg by mouth 3 (three) times daily., Disp: , Rfl:  .  albuterol (PROVENTIL) (2.5 MG/3ML) 0.083% nebulizer solution, Take 3 mLs (2.5 mg total) by nebulization every 2 (two) hours as needed for wheezing., Disp: 75 mL, Rfl: 12 .  amantadine (SYMMETREL) 100 MG capsule, Take 100 mg by mouth daily. , Disp: , Rfl:  .  baclofen (LIORESAL) 20 MG tablet, Take 1 tablet (20 mg total) by mouth 3 (three) times daily as needed for muscle spasms., Disp: 30 each, Rfl: 0 .  carvedilol (COREG) 25 MG tablet, Take 1 tablet (25 mg total) by mouth 2 (two) times daily with a meal., Disp: , Rfl:  .  Cholecalciferol (VITAMIN D-3 PO), Take 1,000 Int'l Units/day by mouth daily. , Disp: , Rfl:  .  CRANBERRY PO, Take 100 mg by mouth daily., Disp: , Rfl:  .  DULoxetine  (CYMBALTA) 60 MG capsule, Take 60 mg by mouth daily., Disp: , Rfl:  .  famotidine (PEPCID) 20 MG tablet, Take 1 tablet (20 mg total) by mouth 2 (two) times daily., Disp: , Rfl:  .  gabapentin (NEURONTIN) 100 MG capsule, Take 7 capsules (700 mg total) by mouth 2 (two) times daily., Disp: 30 capsule, Rfl: 0 .  hydrocortisone cream 0.5 %, Apply 1 application topically 2 (two) times daily., Disp: , Rfl:  .  Lactase (LACTAID PO), Take 1 tablet by mouth daily., Disp: , Rfl:  .  Lactulose 20 GM/30ML SOLN, Take 30 mLs by mouth daily., Disp: , Rfl:  .  latanoprost (XALATAN) 0.005 % ophthalmic solution, Place 1 drop into both eyes at bedtime., Disp: , Rfl:  .  magnesium hydroxide (MILK OF MAGNESIA) 400 MG/5ML suspension, Take 30 mLs by mouth every 8 (eight) hours as needed for mild constipation or moderate constipation., Disp: ,  Rfl:  .  Menthol, Topical Analgesic, 154 MG PADS, Icy Hot Patch: Apply to left shoulder topically every 8 hours as needed for pain., Disp: , Rfl:  .  mirabegron ER (MYRBETRIQ) 25 MG TB24 tablet, Take 50 mg by mouth at bedtime. , Disp: , Rfl:  .  ondansetron (ZOFRAN) 4 MG tablet, Take 1 tablet (4 mg total) by mouth every 6 (six) hours as needed for nausea., Disp: 20 tablet, Rfl: 0 .  polyethylene glycol (MIRALAX / GLYCOLAX) packet, Take 17 g by mouth daily as needed for mild constipation., Disp: 120 packet, Rfl: 11 .  potassium chloride (KLOR-CON) 20 MEQ packet, Take 20 mEq by mouth daily. , Disp: , Rfl:  .  Propylene Glycol (SYSTANE BALANCE) 0.6 % SOLN, Place 1 drop into both eyes 2 (two) times daily., Disp: , Rfl:  .  ramelteon (ROZEREM) 8 MG tablet, Take 8 mg by mouth at bedtime., Disp: , Rfl:  .  senna-docusate (SENOKOT S) 8.6-50 MG tablet, Take 1 tablet by mouth 2 (two) times daily., Disp: , Rfl:  .  SIMETHICONE PO, Take 40 mg by mouth every 6 (six) hours., Disp: , Rfl:  .  spironolactone (ALDACTONE) 25 MG tablet, Take 25 mg by mouth daily. For CHF, Disp: , Rfl:  .  tamsulosin  (FLOMAX) 0.4 MG CAPS capsule, Take 0.4 mg by mouth daily. For incontinence, Disp: , Rfl:  .  torsemide (DEMADEX) 20 MG tablet, Take 20 mg by mouth daily., Disp: , Rfl:  .  zolpidem (AMBIEN) 5 MG tablet, Take 1 tablet (5 mg total) by mouth at bedtime as needed for sleep., Disp: 30 tablet, Rfl: 5  PAST MEDICAL HISTORY: Past Medical History:  Diagnosis Date  . Cardiomyopathy ([place])    a.  Echo 04/29/12: Mild LVH, EF 20-25%, mild AI, moderate MR, moderate LAE, mild RAE, mild RVE, moderate TR, PASP 51, small pericardial effusion;   b. probably non-ischemic given multiple chemo-Tx agents used for MS and global LV dysfn on echo  . Chronic systolic heart failure ([place])   . Depression   . Glaucoma   . Hypertension   . MS (multiple sclerosis) ([place])    a. Dx'd late 20's. b. Tx with Novantrone, Tysabri, Copaxone previously.    PAST SURGICAL HISTORY: Past Surgical History:  Procedure Laterality Date  . ABLATION     uterine  . CESAREAN SECTION      FAMILY HISTORY: Family History  Problem Relation Age of Onset  . Hypertension Mother   . Cancer Mother     breast   . Cancer Father     prostate  . Multiple sclerosis Sister   . Heart attack Neg Hx     SOCIAL HISTORY:  Social History   Social History  . Marital status: Single    Spouse name: N/A  . Number of children: N/A  . Years of education: N/A   Occupational History  . Not on file.   Social History Main Topics  . Smoking status: Former Research scientist (life sciences)  . Smokeless tobacco: Never Used  . Alcohol use No  . Drug use: No  . Sexual activity: Not Currently   Other Topics Concern  . Not on file   Social History Narrative  . No narrative on file     PHYSICAL EXAM  Vitals:   09/26/16 1519  BP: 128/81  Pulse: 74  Resp: 20  Weight: 236 lb (107 kg)  Height: 5\' 5"  (1.651 m)    Body mass index is 39.27  kg/m.   General: The patient is well-developed and well-nourished and in no acute distress  Skin: Extremities show minimal  ankle edema.   Neurologic Exam  Mental status: The patient is alert and oriented x 3 at the time of the examination. The patient has apparent normal recent and remote memory, with an apparently normal attention span and concentration ability.   Speech is normal.  Cranial nerves: Extraocular movements are full.  There is good facial sensation to soft touch bilaterally.Facial strength is normal.  Trapezius and sternocleidomastoid strength is normal. No dysarthria is noted.  The tongue is midline, and the patient has symmetric elevation of the soft palate. No obvious hearing deficits are noted.  Motor:  Muscle bulk is normal.   Tone is increase in the legs greater than the arms, left worse than right. Strength is 4+/5 in the right arm, 4 minus/5 in the left arm, 2/5 in the right leg and 2-/5 in the left leg..   Sensory: Sensory testing is intact to touch and vibration sensation in all 4 extremities.  Coordination: Cerebellar testing reveals good left and poor right finger-nose-finger bilaterally.  Gait and station: Wheelchair bound   DTRs:   Deep tendon reflexes are increased in all 4 limbs. She has clonus at the ankles. There is spread at the knees.  DIAGNOSTIC DATA (LABS, IMAGING, TESTING) - I reviewed patient records, labs, notes, testing and imaging myself where available.  Lab Results  Component Value Date   WBC 4.9 09/10/2016   HGB 10.5 (A) 09/10/2016   HCT 33 (A) 09/10/2016   MCV 82 01/25/2016   PLT 253 09/10/2016      Component Value Date/Time   NA 143 09/10/2016   K 3.8 09/10/2016   CL 103 01/25/2016 1641   CO2 23 01/25/2016 1641   GLUCOSE 113 (H) 01/25/2016 1641   GLUCOSE 112 (H) 11/09/2015 0441   BUN 14 09/10/2016   CREATININE 1.1 09/10/2016   CREATININE 1.20 (H) 01/25/2016 1641   CALCIUM 9.0 01/25/2016 1641   PROT 6.8 01/25/2016 1641   ALBUMIN 3.8 01/25/2016 1641   AST 16 09/10/2016   ALT 17 09/10/2016   ALKPHOS 139 (A) 09/10/2016   BILITOT <0.2 01/25/2016 1641    GFRNONAA 52 (L) 01/25/2016 1641   GFRAA 60 01/25/2016 1641   Lab Results  Component Value Date   CHOL 200 09/10/2016   HDL 29 (A) 09/10/2016   LDLCALC 142 09/10/2016   TRIG 144 09/10/2016   CHOLHDL 6.3 05/01/2012   Lab Results  Component Value Date   HGBA1C 5.3 01/11/2015   Lab Results  Component Value Date   VITAMINB12 2,449 (H) 07/10/2015   Lab Results  Component Value Date   TSH 2.87 03/03/2016       ASSESSMENT AND PLAN  MULTIPLE SCLEROSIS, PROGRESSIVE/RELAPSING  Paraplegia (HCC)  Depression, unspecified depression type  Neurogenic bladder  Class 2 obesity due to excess calories with serious comorbidity and body mass index (BMI) of 39.0 to 39.9 in adult  Insomnia, unspecified type   1.   Continue Ocrevus.   Next infusion will be in April  2.   Change Rozerem to Ambien.  3.   Continue other meds.    4.   Script for electric wheelchair 5.   She will return in about 5-6 months or sooner if she has new or worsening neurologic symptoms.  Richard A. Felecia Shelling, MD, PhD 0/93/8182, 9:93 PM Certified in Neurology, Clinical Neurophysiology, Sleep Medicine, Pain Medicine and Neuroimaging  [facility] [address], [place],  [address] [phone]

## 2016-09-27 DIAGNOSIS — R531 Weakness: Secondary | ICD-10-CM | POA: Diagnosis not present

## 2016-09-27 DIAGNOSIS — G35 Multiple sclerosis: Secondary | ICD-10-CM | POA: Diagnosis not present

## 2016-09-27 DIAGNOSIS — R1311 Dysphagia, oral phase: Secondary | ICD-10-CM | POA: Diagnosis not present

## 2016-09-28 DIAGNOSIS — G35 Multiple sclerosis: Secondary | ICD-10-CM | POA: Diagnosis not present

## 2016-09-28 DIAGNOSIS — R1311 Dysphagia, oral phase: Secondary | ICD-10-CM | POA: Diagnosis not present

## 2016-09-28 DIAGNOSIS — R531 Weakness: Secondary | ICD-10-CM | POA: Diagnosis not present

## 2016-10-01 DIAGNOSIS — R531 Weakness: Secondary | ICD-10-CM | POA: Diagnosis not present

## 2016-10-01 DIAGNOSIS — R1311 Dysphagia, oral phase: Secondary | ICD-10-CM | POA: Diagnosis not present

## 2016-10-01 DIAGNOSIS — G35 Multiple sclerosis: Secondary | ICD-10-CM | POA: Diagnosis not present

## 2016-10-02 DIAGNOSIS — G35 Multiple sclerosis: Secondary | ICD-10-CM | POA: Diagnosis not present

## 2016-10-02 DIAGNOSIS — R1311 Dysphagia, oral phase: Secondary | ICD-10-CM | POA: Diagnosis not present

## 2016-10-02 DIAGNOSIS — R531 Weakness: Secondary | ICD-10-CM | POA: Diagnosis not present

## 2016-10-03 DIAGNOSIS — R1311 Dysphagia, oral phase: Secondary | ICD-10-CM | POA: Diagnosis not present

## 2016-10-03 DIAGNOSIS — R531 Weakness: Secondary | ICD-10-CM | POA: Diagnosis not present

## 2016-10-03 DIAGNOSIS — G35 Multiple sclerosis: Secondary | ICD-10-CM | POA: Diagnosis not present

## 2016-10-04 DIAGNOSIS — R531 Weakness: Secondary | ICD-10-CM | POA: Diagnosis not present

## 2016-10-04 DIAGNOSIS — R1311 Dysphagia, oral phase: Secondary | ICD-10-CM | POA: Diagnosis not present

## 2016-10-04 DIAGNOSIS — G35 Multiple sclerosis: Secondary | ICD-10-CM | POA: Diagnosis not present

## 2016-10-05 DIAGNOSIS — G35 Multiple sclerosis: Secondary | ICD-10-CM | POA: Diagnosis not present

## 2016-10-05 DIAGNOSIS — R531 Weakness: Secondary | ICD-10-CM | POA: Diagnosis not present

## 2016-10-05 DIAGNOSIS — R1311 Dysphagia, oral phase: Secondary | ICD-10-CM | POA: Diagnosis not present

## 2016-10-08 DIAGNOSIS — R1311 Dysphagia, oral phase: Secondary | ICD-10-CM | POA: Diagnosis not present

## 2016-10-08 DIAGNOSIS — G35 Multiple sclerosis: Secondary | ICD-10-CM | POA: Diagnosis not present

## 2016-10-08 DIAGNOSIS — R531 Weakness: Secondary | ICD-10-CM | POA: Diagnosis not present

## 2016-10-09 DIAGNOSIS — R1311 Dysphagia, oral phase: Secondary | ICD-10-CM | POA: Diagnosis not present

## 2016-10-09 DIAGNOSIS — G35 Multiple sclerosis: Secondary | ICD-10-CM | POA: Diagnosis not present

## 2016-10-09 DIAGNOSIS — R531 Weakness: Secondary | ICD-10-CM | POA: Diagnosis not present

## 2016-10-10 DIAGNOSIS — G35 Multiple sclerosis: Secondary | ICD-10-CM | POA: Diagnosis not present

## 2016-10-10 DIAGNOSIS — R531 Weakness: Secondary | ICD-10-CM | POA: Diagnosis not present

## 2016-10-10 DIAGNOSIS — R1311 Dysphagia, oral phase: Secondary | ICD-10-CM | POA: Diagnosis not present

## 2016-10-11 DIAGNOSIS — R531 Weakness: Secondary | ICD-10-CM | POA: Diagnosis not present

## 2016-10-11 DIAGNOSIS — G35 Multiple sclerosis: Secondary | ICD-10-CM | POA: Diagnosis not present

## 2016-10-11 DIAGNOSIS — R1311 Dysphagia, oral phase: Secondary | ICD-10-CM | POA: Diagnosis not present

## 2016-10-12 ENCOUNTER — Encounter: Payer: Self-pay | Admitting: Adult Health

## 2016-10-12 ENCOUNTER — Non-Acute Institutional Stay (SKILLED_NURSING_FACILITY): Payer: Medicare Other | Admitting: Adult Health

## 2016-10-12 DIAGNOSIS — K219 Gastro-esophageal reflux disease without esophagitis: Secondary | ICD-10-CM

## 2016-10-12 DIAGNOSIS — G35 Multiple sclerosis: Secondary | ICD-10-CM

## 2016-10-12 DIAGNOSIS — R531 Weakness: Secondary | ICD-10-CM | POA: Diagnosis not present

## 2016-10-12 DIAGNOSIS — G8220 Paraplegia, unspecified: Secondary | ICD-10-CM | POA: Diagnosis not present

## 2016-10-12 DIAGNOSIS — G894 Chronic pain syndrome: Secondary | ICD-10-CM

## 2016-10-12 DIAGNOSIS — R1311 Dysphagia, oral phase: Secondary | ICD-10-CM | POA: Diagnosis not present

## 2016-10-12 DIAGNOSIS — E271 Primary adrenocortical insufficiency: Secondary | ICD-10-CM

## 2016-10-12 DIAGNOSIS — I5042 Chronic combined systolic (congestive) and diastolic (congestive) heart failure: Secondary | ICD-10-CM | POA: Diagnosis not present

## 2016-10-12 DIAGNOSIS — N319 Neuromuscular dysfunction of bladder, unspecified: Secondary | ICD-10-CM

## 2016-10-12 DIAGNOSIS — I110 Hypertensive heart disease with heart failure: Secondary | ICD-10-CM | POA: Diagnosis not present

## 2016-10-12 NOTE — Progress Notes (Signed)
Location:   [place] Room Number: [place] of Service:  SNF (31)   CODE STATUS: DNR  Allergies  Allergen Reactions  . Sulfonamide Derivatives Hives and Shortness Of Breath  . Penicillins Hives and Itching    Has patient had a PCN reaction causing immediate rash, facial/tongue/throat swelling, SOB or lightheadedness with hypotension: unknown Has patient had a PCN reaction causing severe rash involving mucus membranes or skin necrosis: unknown Has patient had a PCN reaction that required hospitalization: unknown Has patient had a PCN reaction occurring within the last 10 years: unknown If all of the above answers are "NO", then may proceed with Cephalosporin use. Prior course of rocephin charted 05/2015    Chief Complaint  Patient presents with  . Medical Management of Chronic Issues    1 month follow up    HPI:  She is a long term resident of this facility being seen for the management of her chronic illnesses. Overall her status is stable. She does get out of bed daily. She is not voicing any complaints at this time. There are no nursing concerns at this time.    Past Medical History:  Diagnosis Date  . Cardiomyopathy ([place])    a.  Echo 04/29/12: Mild LVH, EF 20-25%, mild AI, moderate MR, moderate LAE, mild RAE, mild RVE, moderate TR, PASP 51, small pericardial effusion;   b. probably non-ischemic given multiple chemo-Tx agents used for MS and global LV dysfn on echo  . Chronic systolic heart failure ([place])   . Depression   . Glaucoma   . Hypertension   . MS (multiple sclerosis) ([place])    a. Dx'd late 20's. b. Tx with Novantrone, Tysabri, Copaxone previously.    Past Surgical History:  Procedure Laterality Date  . ABLATION     uterine  . CESAREAN SECTION      Social History   Social History  . Marital status: Single    Spouse name: N/A  . Number of children: N/A  . Years of education: N/A   Occupational History  . Not on file.   Social  History Main Topics  . Smoking status: Former Research scientist (life sciences)  . Smokeless tobacco: Never Used  . Alcohol use No  . Drug use: No  . Sexual activity: Not Currently   Other Topics Concern  . Not on file   Social History Narrative  . No narrative on file   Family History  Problem Relation Age of Onset  . Hypertension Mother   . Cancer Mother     breast   . Cancer Father     prostate  . Multiple sclerosis Sister   . Heart attack Neg Hx       VITAL SIGNS BP (!) 110/58   Pulse 72   Temp 97 F (36.1 C)   Resp 18   Ht 5\' 5"  (1.651 m)   Wt 233 lb (105.7 kg)   SpO2 97%   BMI 38.77 kg/m   Patient's Medications  New Prescriptions   No medications on file  Previous Medications   ACETAMINOPHEN (TYLENOL) 325 MG TABLET    Take 650 mg by mouth 3 (three) times daily.   ALBUTEROL (PROVENTIL) (2.5 MG/3ML) 0.083% NEBULIZER SOLUTION    Take 3 mLs (2.5 mg total) by nebulization every 2 (two) hours as needed for wheezing.   AMANTADINE (SYMMETREL) 100 MG CAPSULE    Take 100 mg by mouth daily.    BACLOFEN (LIORESAL) 20 MG TABLET  Take 1 tablet (20 mg total) by mouth 3 (three) times daily as needed for muscle spasms.   CARVEDILOL (COREG) 25 MG TABLET    Take 1 tablet (25 mg total) by mouth 2 (two) times daily with a meal.   CHOLECALCIFEROL (VITAMIN D-3 PO)    Take 1,000 Int'l Units/day by mouth daily.    CRANBERRY PO    Take 100 mg by mouth daily.   DULOXETINE (CYMBALTA) 60 MG CAPSULE    Take 60 mg by mouth daily.   FAMOTIDINE (PEPCID) 20 MG TABLET    Take 1 tablet (20 mg total) by mouth 2 (two) times daily.   GABAPENTIN (NEURONTIN) 100 MG CAPSULE    Take 7 capsules (700 mg total) by mouth 2 (two) times daily.   HYDROCORTISONE CREAM 0.5 %    Apply 1 application topically 2 (two) times daily.   LACTASE (LACTAID PO)    Take 1 tablet by mouth daily.   LACTULOSE 20 GM/30ML SOLN    Take 30 mLs by mouth daily.   LATANOPROST (XALATAN) 0.005 % OPHTHALMIC SOLUTION    Place 1 drop into both eyes at  bedtime.   MAGNESIUM HYDROXIDE (MILK OF MAGNESIA) 400 MG/5ML SUSPENSION    Take 30 mLs by mouth every 8 (eight) hours as needed for mild constipation or moderate constipation.   MENTHOL, TOPICAL ANALGESIC, 154 MG PADS    Icy Hot Patch: Apply to left shoulder topically every 8 hours as needed for pain.   MIRABEGRON ER (MYRBETRIQ) 25 MG TB24 TABLET    Take 50 mg by mouth at bedtime.    ONDANSETRON (ZOFRAN) 4 MG TABLET    Take 1 tablet (4 mg total) by mouth every 6 (six) hours as needed for nausea.   POLYETHYLENE GLYCOL (MIRALAX / GLYCOLAX) PACKET    Take 17 g by mouth daily as needed for mild constipation.   POTASSIUM CHLORIDE (KLOR-CON) 20 MEQ PACKET    Take 20 mEq by mouth daily.    PROPYLENE GLYCOL (SYSTANE BALANCE) 0.6 % SOLN    Place 1 drop into both eyes 2 (two) times daily.   RAMELTEON (ROZEREM) 8 MG TABLET    Take 8 mg by mouth at bedtime.   SENNA-DOCUSATE (SENOKOT S) 8.6-50 MG TABLET    Take 1 tablet by mouth 2 (two) times daily.   SIMETHICONE PO    Take 40 mg by mouth every 6 (six) hours.   SPIRONOLACTONE (ALDACTONE) 25 MG TABLET    Take 25 mg by mouth daily. For CHF   TAMSULOSIN (FLOMAX) 0.4 MG CAPS CAPSULE    Take 0.4 mg by mouth daily. For incontinence   TORSEMIDE (DEMADEX) 20 MG TABLET    Take 20 mg by mouth daily.   ZOLPIDEM (AMBIEN) 5 MG TABLET    Take 1 tablet (5 mg total) by mouth at bedtime as needed for sleep.  Modified Medications   No medications on file  Discontinued Medications   No medications on file     SIGNIFICANT DIAGNOSTIC EXAMS  10-08-14: MRI brain: No significant change in diffuse white matter lesions consistent with patient's history of multiple sclerosis. None of these areas demonstrate abnormal restriction or enhancement as may be seen with areas of acute demyelination. Global atrophy. 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 Right parotid 1 cm lesion does not have typical appearance of an intra  parotid lymph node and therefore primary parotid lesion  is a possibility. ENT consultation may be considered for further delineation.  10-08-14: mri of cervical; spine: Scattered new areas of altered signal intensity (compared to 2007) throughout the cervical cord as detailed above consistent with patient's history of multiple sclerosis. None of these areas demonstrate enhancement as may be seen with areas of active demyelination. Cervical spondylotic changes as detailed above most prominent C5-6 level.  12-13-14: ABI: right: mild atherosclerotic disease   01-10-15: ct of head: No acute intracranial abnormalities. Mild atrophy. White matter disease compatible with history of MS.  01-10-15: chest x-ray: No active disease  01-12-15: 2-d echo: Left ventricle: The cavity size was normal. There was mild concentric hypertrophy. Systolic function was normal. The estimated ejection fraction was in the range of 60% to 65%. Wall motion was normal; there were no regional wall motion abnormalities. Doppler parameters are consistent with abnormal left ventricular relaxation (grade 1 diastolic dysfunction). - Aortic valve: There was mild regurgitation. - Mitral valve: Calcified annulus. There was mild regurgitation.  05-30-15: chest x-ray: No active disease.  07-14-15: EEG: This EEG is consistent with a mild generalized non-specific cerebral dysfunction(encephalopathy).   07-20-15: chest x-ray; Interval extubation. Decreasing lung volumes with increasing bibasilar opacities, likely atelectasis.  11-07-15: ct of head; No acute intracranial abnormality. White matter changes are suggestive for chronic small vessel ischemic disease. Right maxillary sinus disease.     LABS REVIEWED:   11-16-15: wbc 8.2; hgb 9.4; hct 30.9; mcv 82.5; plt 249 glucose 89; bun 15.0; creat 0.75; k+ 3.9; na++ 141; liver normal albumin 3.5  03-03-16: wbc 5.3; hgb 10.0; hct 32.6; mcv 81.9; plt 242; glucose 149; bun 15.2; creat 1.26; k+ 3.4;  na++ 143; ast 140; albumin 3.9; vit B 12: >2000 03-23-16: glucose 94; bun 18.5; creat 1.42; k+ 4.1; na++ 142  08-09-16: chol 159; ldl 109; trig 109; hdl 28  09-10-16: wbc 4.9; hgb 10.5; hct 33.2; mcv 86.6; plt 253  glucose 164; bun 9.0; creat 1.12; k+ 3.8; na++ 14; liver normal albumin 3.8 chol 200; ldl 142; trig 144; hdl 29    Review of Systems  Constitutional: Negative for malaise/fatigue.  Respiratory: Negative for cough and shortness of breath.   Cardiovascular: Negative for chest pain, palpitations and leg swelling.  Gastrointestinal: Negative for heartburn, abdominal pain and constipation.  Musculoskeletal: has generalized aches and pains.   Skin: Negative.   Neurological: Negative for dizziness.  Psychiatric/Behavioral: no depression      Physical Exam Constitutional: . No distress.  Obese   Neck: Neck supple. No JVD present. No thyromegaly present.  Cardiovascular: Normal rate, regular rhythm and intact distal pulses.   Respiratory: Effort normal and breath sounds normal. No respiratory distress.  GI: Soft. Bowel sounds are normal. She exhibits no distension. There is no tenderness.  Musculoskeletal: She exhibits no edema.  Is able to move right  upper extremity Limited movement in her lower extremities  and left upper extremity  Neurological: alert and oriented X 3   .  Skin: Skin is warm and dry. She is not diaphoretic.    ASSESSMENT/ PLAN:  1. MS: is slowly declining   Has been  followed by  neurology;  Has paraplegia.will continue amantadine 100 mg daily baclofen 20 mg tid prn  for muscle spasms;   Saw neurology on 09-26-16; is due for ocrevus injection in April 2018.    2. Hypertension: will lower coreg 12. 5 mg twice daily and will monitor   3. Chronic combined systolic and diastolic heart failure:EF is  60-65%(01-12-15)    is stable will continue demadex 20  mg daily; with k+ 20 meq daily; will continue aldactone 25 mg daily;    4. Neurogenic bladder: takes flomax 0.4  mg daily  myrbetriq 50 mg daily   I/O cath every 6 hours as needed   5. Depression: is presently taking cymbalta 60 mg daily; which also helps with her pain management.  rozerem 6 mg nightly for insomnia    6. Chronic pain:   will continue  neurontin 700 mg twice  daily; takes cymbalta 60 mg daily   tylenol 650 mg three times daily routinely   9. Constipation: will continue  miralax  daily as needed and senna s twice daily   10. Gerd: will continue pepcid 20 mg twice daily   11. Glaucoma: will continue xalatan to both eyes.    12. Obesity: awaiting bariatric consult   13. Addison disease:  Will check cortisol level    MD is aware of resident's narcotic use and is in agreement with current plan of care. We will attempt to wean resident as apropriate    Ok Edwards NP [facility]  Contact [phone] Monday through Friday 8am- 5pm  After hours call [phone]

## 2016-10-15 DIAGNOSIS — R531 Weakness: Secondary | ICD-10-CM | POA: Diagnosis not present

## 2016-10-15 DIAGNOSIS — G35 Multiple sclerosis: Secondary | ICD-10-CM | POA: Diagnosis not present

## 2016-10-15 DIAGNOSIS — R1311 Dysphagia, oral phase: Secondary | ICD-10-CM | POA: Diagnosis not present

## 2016-10-16 DIAGNOSIS — R1311 Dysphagia, oral phase: Secondary | ICD-10-CM | POA: Diagnosis not present

## 2016-10-16 DIAGNOSIS — G35 Multiple sclerosis: Secondary | ICD-10-CM | POA: Diagnosis not present

## 2016-10-16 DIAGNOSIS — R531 Weakness: Secondary | ICD-10-CM | POA: Diagnosis not present

## 2016-10-17 DIAGNOSIS — R1311 Dysphagia, oral phase: Secondary | ICD-10-CM | POA: Diagnosis not present

## 2016-10-17 DIAGNOSIS — G35 Multiple sclerosis: Secondary | ICD-10-CM | POA: Diagnosis not present

## 2016-10-17 DIAGNOSIS — R531 Weakness: Secondary | ICD-10-CM | POA: Diagnosis not present

## 2016-10-18 DIAGNOSIS — G35 Multiple sclerosis: Secondary | ICD-10-CM | POA: Diagnosis not present

## 2016-10-18 DIAGNOSIS — R1311 Dysphagia, oral phase: Secondary | ICD-10-CM | POA: Diagnosis not present

## 2016-10-18 DIAGNOSIS — R531 Weakness: Secondary | ICD-10-CM | POA: Diagnosis not present

## 2016-10-19 DIAGNOSIS — R531 Weakness: Secondary | ICD-10-CM | POA: Diagnosis not present

## 2016-10-19 DIAGNOSIS — G35 Multiple sclerosis: Secondary | ICD-10-CM | POA: Diagnosis not present

## 2016-10-19 DIAGNOSIS — R1311 Dysphagia, oral phase: Secondary | ICD-10-CM | POA: Diagnosis not present

## 2016-10-22 DIAGNOSIS — G35 Multiple sclerosis: Secondary | ICD-10-CM | POA: Diagnosis not present

## 2016-10-22 DIAGNOSIS — R1311 Dysphagia, oral phase: Secondary | ICD-10-CM | POA: Diagnosis not present

## 2016-10-22 DIAGNOSIS — R531 Weakness: Secondary | ICD-10-CM | POA: Diagnosis not present

## 2016-10-23 DIAGNOSIS — G35 Multiple sclerosis: Secondary | ICD-10-CM | POA: Diagnosis not present

## 2016-10-23 DIAGNOSIS — R531 Weakness: Secondary | ICD-10-CM | POA: Diagnosis not present

## 2016-10-23 DIAGNOSIS — R1311 Dysphagia, oral phase: Secondary | ICD-10-CM | POA: Diagnosis not present

## 2016-10-24 DIAGNOSIS — G35 Multiple sclerosis: Secondary | ICD-10-CM | POA: Diagnosis not present

## 2016-10-24 DIAGNOSIS — R531 Weakness: Secondary | ICD-10-CM | POA: Diagnosis not present

## 2016-10-24 DIAGNOSIS — R1311 Dysphagia, oral phase: Secondary | ICD-10-CM | POA: Diagnosis not present

## 2016-10-25 DIAGNOSIS — R1311 Dysphagia, oral phase: Secondary | ICD-10-CM | POA: Diagnosis not present

## 2016-10-25 DIAGNOSIS — R531 Weakness: Secondary | ICD-10-CM | POA: Diagnosis not present

## 2016-10-25 DIAGNOSIS — G35 Multiple sclerosis: Secondary | ICD-10-CM | POA: Diagnosis not present

## 2016-10-26 DIAGNOSIS — R531 Weakness: Secondary | ICD-10-CM | POA: Diagnosis not present

## 2016-10-26 DIAGNOSIS — R1311 Dysphagia, oral phase: Secondary | ICD-10-CM | POA: Diagnosis not present

## 2016-10-26 DIAGNOSIS — G35 Multiple sclerosis: Secondary | ICD-10-CM | POA: Diagnosis not present

## 2016-10-28 DIAGNOSIS — G35 Multiple sclerosis: Secondary | ICD-10-CM | POA: Diagnosis not present

## 2016-10-28 DIAGNOSIS — R1311 Dysphagia, oral phase: Secondary | ICD-10-CM | POA: Diagnosis not present

## 2016-10-28 DIAGNOSIS — R531 Weakness: Secondary | ICD-10-CM | POA: Diagnosis not present

## 2016-10-29 DIAGNOSIS — G35 Multiple sclerosis: Secondary | ICD-10-CM | POA: Diagnosis not present

## 2016-10-29 DIAGNOSIS — R531 Weakness: Secondary | ICD-10-CM | POA: Diagnosis not present

## 2016-10-29 DIAGNOSIS — R1311 Dysphagia, oral phase: Secondary | ICD-10-CM | POA: Diagnosis not present

## 2016-10-30 DIAGNOSIS — G35 Multiple sclerosis: Secondary | ICD-10-CM | POA: Diagnosis not present

## 2016-10-30 DIAGNOSIS — R531 Weakness: Secondary | ICD-10-CM | POA: Diagnosis not present

## 2016-10-30 DIAGNOSIS — R1311 Dysphagia, oral phase: Secondary | ICD-10-CM | POA: Diagnosis not present

## 2016-10-31 ENCOUNTER — Encounter: Payer: Self-pay | Admitting: Adult Health

## 2016-10-31 ENCOUNTER — Non-Acute Institutional Stay (SKILLED_NURSING_FACILITY): Payer: Medicare Other | Admitting: Adult Health

## 2016-10-31 DIAGNOSIS — G4700 Insomnia, unspecified: Secondary | ICD-10-CM | POA: Diagnosis not present

## 2016-10-31 DIAGNOSIS — G35 Multiple sclerosis: Secondary | ICD-10-CM | POA: Diagnosis not present

## 2016-10-31 DIAGNOSIS — R1311 Dysphagia, oral phase: Secondary | ICD-10-CM | POA: Diagnosis not present

## 2016-10-31 DIAGNOSIS — F329 Major depressive disorder, single episode, unspecified: Secondary | ICD-10-CM | POA: Diagnosis not present

## 2016-10-31 DIAGNOSIS — F32A Depression, unspecified: Secondary | ICD-10-CM

## 2016-10-31 DIAGNOSIS — R531 Weakness: Secondary | ICD-10-CM | POA: Diagnosis not present

## 2016-10-31 NOTE — Progress Notes (Signed)
Location:   [place] Room Number: [place] of Service:  SNF (31)   CODE STATUS: DNR  Allergies  Allergen Reactions  . Sulfonamide Derivatives Hives and Shortness Of Breath  . Penicillins Hives and Itching    Has patient had a PCN reaction causing immediate rash, facial/tongue/throat swelling, SOB or lightheadedness with hypotension: unknown Has patient had a PCN reaction causing severe rash involving mucus membranes or skin necrosis: unknown Has patient had a PCN reaction that required hospitalization: unknown Has patient had a PCN reaction occurring within the last 10 years: unknown If all of the above answers are "NO", then may proceed with Cephalosporin use. Prior course of rocephin charted 05/2015    Chief Complaint  Patient presents with  . Acute Visit    Insomnia    HPI:  She is complaining of insomnia. She states that she cannot get to sleep and cannot stay asleep. She had tried numerous medications without success. She tells me that ambien 10 mg worked for her in the past. We did discuss her treatment options and she is willing to try ambien cr.   Past Medical History:  Diagnosis Date  . Cardiomyopathy ([place])    a.  Echo 04/29/12: Mild LVH, EF 20-25%, mild AI, moderate MR, moderate LAE, mild RAE, mild RVE, moderate TR, PASP 51, small pericardial effusion;   b. probably non-ischemic given multiple chemo-Tx agents used for MS and global LV dysfn on echo  . Chronic systolic heart failure ([place])   . Depression   . Glaucoma   . Hypertension   . MS (multiple sclerosis) ([place])    a. Dx'd late 20's. b. Tx with Novantrone, Tysabri, Copaxone previously.    Past Surgical History:  Procedure Laterality Date  . ABLATION     uterine  . CESAREAN SECTION      Social History   Social History  . Marital status: Single    Spouse name: N/A  . Number of children: N/A  . Years of education: N/A   Occupational History  . Not on file.   Social History Main  Topics  . Smoking status: Former Research scientist (life sciences)  . Smokeless tobacco: Never Used  . Alcohol use No  . Drug use: No  . Sexual activity: Not Currently   Other Topics Concern  . Not on file   Social History Narrative  . No narrative on file   Family History  Problem Relation Age of Onset  . Hypertension Mother   . Cancer Mother     breast   . Cancer Father     prostate  . Multiple sclerosis Sister   . Heart attack Neg Hx       VITAL SIGNS Ht 5\' 5"  (1.651 m)   Wt 233 lb (105.7 kg)   BMI 38.77 kg/m  No other vital signs available   Patient's Medications  New Prescriptions   No medications on file  Previous Medications   ACETAMINOPHEN (TYLENOL) 325 MG TABLET    Take 650 mg by mouth 3 (three) times daily.   ALBUTEROL (PROVENTIL) (2.5 MG/3ML) 0.083% NEBULIZER SOLUTION    Take 3 mLs (2.5 mg total) by nebulization every 2 (two) hours as needed for wheezing.   AMANTADINE (SYMMETREL) 100 MG CAPSULE    Take 100 mg by mouth daily.    BACLOFEN (LIORESAL) 20 MG TABLET    Take 1 tablet (20 mg total) by mouth 3 (three) times daily as needed for muscle spasms.   CARVEDILOL (COREG)  25 MG TABLET    Take 1 tablet (25 mg total) by mouth 2 (two) times daily with a meal.   CHOLECALCIFEROL (VITAMIN D-3 PO)    Take 1,000 Int'l Units/day by mouth daily.    CRANBERRY PO    Take 100 mg by mouth daily.   DULOXETINE (CYMBALTA) 60 MG CAPSULE    Take 60 mg by mouth daily.   FAMOTIDINE (PEPCID) 20 MG TABLET    Take 1 tablet (20 mg total) by mouth 2 (two) times daily.   GABAPENTIN (NEURONTIN) 100 MG CAPSULE    Take 7 capsules (700 mg total) by mouth 2 (two) times daily.   HYDROCORTISONE CREAM 0.5 %    Apply 1 application topically 2 (two) times daily.   LACTASE (LACTAID PO)    Take 1 tablet by mouth daily.   LACTULOSE 20 GM/30ML SOLN    Take 30 mLs by mouth daily.   LATANOPROST (XALATAN) 0.005 % OPHTHALMIC SOLUTION    Place 1 drop into both eyes at bedtime.   MAGNESIUM HYDROXIDE (MILK OF MAGNESIA) 400 MG/5ML  SUSPENSION    Take 30 mLs by mouth every 8 (eight) hours as needed for mild constipation or moderate constipation.   MENTHOL, TOPICAL ANALGESIC, 154 MG PADS    Icy Hot Patch: Apply to left shoulder topically every 8 hours as needed for pain.   MIRABEGRON ER (MYRBETRIQ) 25 MG TB24 TABLET    Take 50 mg by mouth at bedtime.    ONDANSETRON (ZOFRAN) 4 MG TABLET    Take 1 tablet (4 mg total) by mouth every 6 (six) hours as needed for nausea.   POLYETHYLENE GLYCOL (MIRALAX / GLYCOLAX) PACKET    Take 17 g by mouth daily as needed for mild constipation.   POTASSIUM CHLORIDE (KLOR-CON) 20 MEQ PACKET    Take 20 mEq by mouth daily.    PROPYLENE GLYCOL (SYSTANE BALANCE) 0.6 % SOLN    Place 1 drop into both eyes 2 (two) times daily.   RAMELTEON (ROZEREM) 8 MG TABLET    Take 8 mg by mouth at bedtime.   SENNA-DOCUSATE (SENOKOT S) 8.6-50 MG TABLET    Take 1 tablet by mouth 2 (two) times daily.   SIMETHICONE PO    Take 40 mg by mouth every 6 (six) hours.   SPIRONOLACTONE (ALDACTONE) 25 MG TABLET    Take 25 mg by mouth daily. For CHF   TAMSULOSIN (FLOMAX) 0.4 MG CAPS CAPSULE    Take 0.4 mg by mouth daily. For incontinence   TORSEMIDE (DEMADEX) 20 MG TABLET    Take 20 mg by mouth daily.   ZOLPIDEM (AMBIEN) 5 MG TABLET    Take 1 tablet (5 mg total) by mouth at bedtime as needed for sleep.  Modified Medications   No medications on file  Discontinued Medications   No medications on file     SIGNIFICANT DIAGNOSTIC EXAMS  10-08-14: MRI brain: No significant change in diffuse white matter lesions consistent with patient's history of multiple sclerosis. None of these areas demonstrate abnormal restriction or enhancement as may be seen with areas of acute demyelination. Global atrophy. 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 Right parotid 1 cm lesion does not have typical appearance of an intra parotid lymph node and therefore primary parotid lesion is a  possibility. ENT consultation may be considered for further delineation.  10-08-14: mri of cervical; spine: Scattered new areas of  altered signal intensity (compared to 2007) throughout the cervical cord as detailed above consistent with patient's history of multiple sclerosis. None of these areas demonstrate enhancement as may be seen with areas of active demyelination. Cervical spondylotic changes as detailed above most prominent C5-6 level.  12-13-14: ABI: right: mild atherosclerotic disease   01-10-15: ct of head: No acute intracranial abnormalities. Mild atrophy. White matter disease compatible with history of MS.  01-10-15: chest x-ray: No active disease  01-12-15: 2-d echo: Left ventricle: The cavity size was normal. There was mild concentric hypertrophy. Systolic function was normal. The estimated ejection fraction was in the range of 60% to 65%. Wall motion was normal; there were no regional wall motion abnormalities. Doppler parameters are consistent with abnormal left ventricular relaxation (grade 1 diastolic dysfunction). - Aortic valve: There was mild regurgitation. - Mitral valve: Calcified annulus. There was mild regurgitation.  05-30-15: chest x-ray: No active disease.  07-14-15: EEG: This EEG is consistent with a mild generalized non-specific cerebral dysfunction(encephalopathy).   07-20-15: chest x-ray; Interval extubation. Decreasing lung volumes with increasing bibasilar opacities, likely atelectasis.  11-07-15: ct of head; No acute intracranial abnormality. White matter changes are suggestive for chronic small vessel ischemic disease. Right maxillary sinus disease.     LABS REVIEWED:   11-16-15: wbc 8.2; hgb 9.4; hct 30.9; mcv 82.5; plt 249 glucose 89; bun 15.0; creat 0.75; k+ 3.9; na++ 141; liver normal albumin 3.5  03-03-16: wbc 5.3; hgb 10.0; hct 32.6; mcv 81.9; plt 242; glucose 149; bun 15.2; creat 1.26; k+ 3.4; na++ 143; ast 140; albumin 3.9; vit B 12: >2000 03-23-16:  glucose 94; bun 18.5; creat 1.42; k+ 4.1; na++ 142  08-09-16: chol 159; ldl 109; trig 109; hdl 28  09-10-16: wbc 4.9; hgb 10.5; hct 33.2; mcv 86.6; plt 253  glucose 164; bun 9.0; creat 1.12; k+ 3.8; na++ 14; liver normal albumin 3.8 chol 200; ldl 142; trig 144; hdl 29    Review of Systems  Constitutional: Negative for malaise/fatigue.  Respiratory: Negative for cough and shortness of breath.   Cardiovascular: Negative for chest pain, palpitations and leg swelling.  Gastrointestinal: Negative for heartburn, abdominal pain and constipation.  Musculoskeletal: has generalized aches and pains.   Skin: Negative.   Neurological: Negative for dizziness.  Psychiatric/Behavioral: no depression  has insomnia     Physical Exam Constitutional: . No distress.  Obese   Neck: Neck supple. No JVD present. No thyromegaly present.  Cardiovascular: Normal rate, regular rhythm and intact distal pulses.   Respiratory: Effort normal and breath sounds normal. No respiratory distress.  GI: Soft. Bowel sounds are normal. She exhibits no distension. There is no tenderness.  Musculoskeletal: She exhibits no edema.  Is able to move right  upper extremity Limited movement in her lower extremities  and left upper extremity  Neurological: alert and oriented X 3   .  Skin: Skin is warm and dry. She is not diaphoretic.    ASSESSMENT/ PLAN:  1. MS: is slowly declining   Has been  followed by  neurology;  Has paraplegia.will continue amantadine 100 mg daily baclofen 20 mg tid prn  for muscle spasms;   Saw neurology on 09-26-16; is due for ocrevus injection in April 2018.    2. Depression: is presently taking cymbalta 60 mg daily; which also helps with her pain management.    3. Insomnia: will stop rozerum will begin ambien cr 6.25 mg nightly and will monitor    MD is aware of resident's narcotic  use and is in agreement with current plan of care. We will attempt to wean resident as apropriate   Ok Edwards  NP [facility]  Contact [phone] Monday through Friday 8am- 5pm  After hours call [phone]

## 2016-11-01 DIAGNOSIS — R531 Weakness: Secondary | ICD-10-CM | POA: Diagnosis not present

## 2016-11-01 DIAGNOSIS — R1311 Dysphagia, oral phase: Secondary | ICD-10-CM | POA: Diagnosis not present

## 2016-11-01 DIAGNOSIS — G35 Multiple sclerosis: Secondary | ICD-10-CM | POA: Diagnosis not present

## 2016-11-02 DIAGNOSIS — R1311 Dysphagia, oral phase: Secondary | ICD-10-CM | POA: Diagnosis not present

## 2016-11-02 DIAGNOSIS — G35 Multiple sclerosis: Secondary | ICD-10-CM | POA: Diagnosis not present

## 2016-11-02 DIAGNOSIS — R531 Weakness: Secondary | ICD-10-CM | POA: Diagnosis not present

## 2016-11-05 DIAGNOSIS — R1311 Dysphagia, oral phase: Secondary | ICD-10-CM | POA: Diagnosis not present

## 2016-11-05 DIAGNOSIS — G35 Multiple sclerosis: Secondary | ICD-10-CM | POA: Diagnosis not present

## 2016-11-05 DIAGNOSIS — R531 Weakness: Secondary | ICD-10-CM | POA: Diagnosis not present

## 2016-11-06 DIAGNOSIS — R1311 Dysphagia, oral phase: Secondary | ICD-10-CM | POA: Diagnosis not present

## 2016-11-06 DIAGNOSIS — R531 Weakness: Secondary | ICD-10-CM | POA: Diagnosis not present

## 2016-11-06 DIAGNOSIS — G35 Multiple sclerosis: Secondary | ICD-10-CM | POA: Diagnosis not present

## 2016-11-07 DIAGNOSIS — G35 Multiple sclerosis: Secondary | ICD-10-CM | POA: Diagnosis not present

## 2016-11-07 DIAGNOSIS — R531 Weakness: Secondary | ICD-10-CM | POA: Diagnosis not present

## 2016-11-07 DIAGNOSIS — R1311 Dysphagia, oral phase: Secondary | ICD-10-CM | POA: Diagnosis not present

## 2016-11-08 DIAGNOSIS — G35 Multiple sclerosis: Secondary | ICD-10-CM | POA: Diagnosis not present

## 2016-11-08 DIAGNOSIS — R1311 Dysphagia, oral phase: Secondary | ICD-10-CM | POA: Diagnosis not present

## 2016-11-08 DIAGNOSIS — R531 Weakness: Secondary | ICD-10-CM | POA: Diagnosis not present

## 2016-11-09 DIAGNOSIS — R531 Weakness: Secondary | ICD-10-CM | POA: Diagnosis not present

## 2016-11-09 DIAGNOSIS — R1311 Dysphagia, oral phase: Secondary | ICD-10-CM | POA: Diagnosis not present

## 2016-11-09 DIAGNOSIS — G35 Multiple sclerosis: Secondary | ICD-10-CM | POA: Diagnosis not present

## 2016-11-12 DIAGNOSIS — R1311 Dysphagia, oral phase: Secondary | ICD-10-CM | POA: Diagnosis not present

## 2016-11-12 DIAGNOSIS — G35 Multiple sclerosis: Secondary | ICD-10-CM | POA: Diagnosis not present

## 2016-11-12 DIAGNOSIS — R531 Weakness: Secondary | ICD-10-CM | POA: Diagnosis not present

## 2016-11-13 DIAGNOSIS — R531 Weakness: Secondary | ICD-10-CM | POA: Diagnosis not present

## 2016-11-13 DIAGNOSIS — R1311 Dysphagia, oral phase: Secondary | ICD-10-CM | POA: Diagnosis not present

## 2016-11-13 DIAGNOSIS — G35 Multiple sclerosis: Secondary | ICD-10-CM | POA: Diagnosis not present

## 2016-11-14 DIAGNOSIS — G35 Multiple sclerosis: Secondary | ICD-10-CM | POA: Diagnosis not present

## 2016-11-14 DIAGNOSIS — R1311 Dysphagia, oral phase: Secondary | ICD-10-CM | POA: Diagnosis not present

## 2016-11-14 DIAGNOSIS — R531 Weakness: Secondary | ICD-10-CM | POA: Diagnosis not present

## 2016-11-15 DIAGNOSIS — G35 Multiple sclerosis: Secondary | ICD-10-CM | POA: Diagnosis not present

## 2016-11-15 DIAGNOSIS — R1311 Dysphagia, oral phase: Secondary | ICD-10-CM | POA: Diagnosis not present

## 2016-11-15 DIAGNOSIS — R531 Weakness: Secondary | ICD-10-CM | POA: Diagnosis not present

## 2016-11-16 DIAGNOSIS — G35 Multiple sclerosis: Secondary | ICD-10-CM | POA: Diagnosis not present

## 2016-11-16 DIAGNOSIS — R1311 Dysphagia, oral phase: Secondary | ICD-10-CM | POA: Diagnosis not present

## 2016-11-16 DIAGNOSIS — R531 Weakness: Secondary | ICD-10-CM | POA: Diagnosis not present

## 2016-11-19 ENCOUNTER — Non-Acute Institutional Stay (SKILLED_NURSING_FACILITY): Payer: Medicare Other | Admitting: Internal Medicine

## 2016-11-19 ENCOUNTER — Encounter: Payer: Self-pay | Admitting: Internal Medicine

## 2016-11-19 DIAGNOSIS — E6609 Other obesity due to excess calories: Secondary | ICD-10-CM | POA: Diagnosis not present

## 2016-11-19 DIAGNOSIS — G35 Multiple sclerosis: Secondary | ICD-10-CM

## 2016-11-19 DIAGNOSIS — G4700 Insomnia, unspecified: Secondary | ICD-10-CM

## 2016-11-19 DIAGNOSIS — G8220 Paraplegia, unspecified: Secondary | ICD-10-CM

## 2016-11-19 DIAGNOSIS — G894 Chronic pain syndrome: Secondary | ICD-10-CM

## 2016-11-19 DIAGNOSIS — I5042 Chronic combined systolic (congestive) and diastolic (congestive) heart failure: Secondary | ICD-10-CM

## 2016-11-19 DIAGNOSIS — R531 Weakness: Secondary | ICD-10-CM | POA: Diagnosis not present

## 2016-11-19 DIAGNOSIS — Z6838 Body mass index (BMI) 38.0-38.9, adult: Secondary | ICD-10-CM | POA: Diagnosis not present

## 2016-11-19 DIAGNOSIS — R1311 Dysphagia, oral phase: Secondary | ICD-10-CM | POA: Diagnosis not present

## 2016-11-19 DIAGNOSIS — E271 Primary adrenocortical insufficiency: Secondary | ICD-10-CM | POA: Diagnosis not present

## 2016-11-19 DIAGNOSIS — N760 Acute vaginitis: Secondary | ICD-10-CM

## 2016-11-19 DIAGNOSIS — Z0001 Encounter for general adult medical examination with abnormal findings: Secondary | ICD-10-CM | POA: Diagnosis not present

## 2016-11-19 DIAGNOSIS — N319 Neuromuscular dysfunction of bladder, unspecified: Secondary | ICD-10-CM

## 2016-11-19 DIAGNOSIS — E66812 Obesity, class 2: Secondary | ICD-10-CM

## 2016-11-19 NOTE — Progress Notes (Signed)
Patient ID: Shannon Garrett, female   DOB: [birth year], 56 y.o.   MRN: [id number]    DATE: 11/19/2016  Location:    [facility] Room Number: 230 B Place of Service: SNF (31)   Extended Emergency Contact Information Primary Emergency Contact: [place], [place] [address] [place] Phone: [phone] Relation: Sister Secondary Emergency Contact: Jefm Bryant, [place] Phone: [phone] Mobile Phone: [phone] Relation: Friend  Advanced Directive information Does Patient Have a Medical Advance Directive?: Yes, Type of Advance Directive: Out of facility DNR (pink MOST or yellow form), Pre-existing out of facility DNR order (yellow form or pink MOST form): Yellow form placed in chart (order not valid for inpatient use);Pink MOST form placed in chart (order not valid for inpatient use), Does patient want to make changes to medical advance directive?: No - Patient declined  Chief Complaint  Patient presents with  . Annual Exam    Yearly Exam    HPI:  56 yo female long term resident seen today for comprehensive exam. She is c/a itchiness in vaginal area and urinary retention. She does not feel cream helping itching. She has seen urology in the past and was told her tx was at the optimal level. She has bladder spasms. She is c/a continued weight gain and is interested in gastric sleeve procedure. She reports she "hardly" eats anything. Unable to exercise due to paraplegia.  MS/paraplegia/neurogenic bladder - progressively slowly declining; followed by neurology Dr Felecia Shelling;  takes amantadine 100 mg daily; baclofen 20 mg tid prn for muscle spasms;  Ocrevus infusions    Hypertension - stable on coreg 12. 5 mg twice daily  Chronic combined systolic and diastolic heart failure (EF 60-65% in July 2016); stable on demadex 20  mg daily with k+ 20 meq daily; aldactone 25 mg daily    Neurogenic bladder - due  to MS. Takes flomax 0.4 mg daily;  myrbetriq 50 mg daily;  I/O cath every 6 hours as needed   Depression/insomnia - mood stable on cymbalta 60 mg daily; sleep stable on ambien CR 6.25 mg nightly   Chronic pain syndrome - stable on neurontin 700 mg twice  daily; cymbalta 60 mg daily;  tylenol 650 mg TID  Constipation - stable on miralax  daily as needed and senna s twice daily   GERD - stable on pepcid 20 mg twice daily   Glaucoma - stable on xalatan to both eyes; followed by eye specialist  Obesity - bariatric consult placed some time ago. Her current BMI is 38.94 (prev 38.77)  Addison disease (adrenal insufficiency) - stable. Cortisol level checked in Apr 2018   Past Medical History:  Diagnosis Date  . Cardiomyopathy ([place])    a.  Echo 04/29/12: Mild LVH, EF 20-25%, mild AI, moderate MR, moderate LAE, mild RAE, mild RVE, moderate TR, PASP 51, small pericardial effusion;   b. probably non-ischemic given multiple chemo-Tx agents used for MS and global LV dysfn on echo  . Chronic systolic heart failure ([place])   . Depression   . Glaucoma   . Hypertension   . MS (multiple sclerosis) ([place] [facility])    a. Dx'd late 20's. b. Tx with Novantrone, Tysabri, Copaxone previously.    Past Surgical History:  Procedure Laterality Date  . ABLATION     uterine  . CESAREAN SECTION      Patient  Care Team: Gildardo Cranker, DO as PCP - General (Internal Medicine) Nyoka Cowden Phylis Bougie, NP as Nurse Practitioner (Nurse Practitioner)  Social History   Social History  . Marital status: Single    Spouse name: N/A  . Number of children: N/A  . Years of education: N/A   Occupational History  . Not on file.   Social History Main Topics  . Smoking status: Former Research scientist (life sciences)  . Smokeless tobacco: Never Used  . Alcohol use No  . Drug use: No  . Sexual activity: Not Currently   Other Topics Concern  . Not on file   Social History Narrative  . No narrative on file     reports that she has quit smoking. She  has never used smokeless tobacco. She reports that she does not drink alcohol or use drugs.  Family History  Problem Relation Age of Onset  . Hypertension Mother   . Cancer Mother        breast   . Cancer Father        prostate  . Multiple sclerosis Sister   . Heart attack Neg Hx    Family Status  Relation Status  . Mother Alive  . Father Deceased  . Sister (Not Specified)  . Neg Hx (Not Specified)    Immunization History  Administered Date(s) Administered  . Influenza,inj,Quad PF,36+ Mos 08/01/2013  . Influenza-Unspecified 08/31/2014, 08/09/2015, 05/19/2016  . PPD Test 03/23/2016, 03/30/2016  . Pneumococcal Polysaccharide-23 08/01/2013  . Pneumococcal-Unspecified 08/30/2013, 07/29/2014, 08/31/2014    Allergies  Allergen Reactions  . Sulfonamide Derivatives Hives and Shortness Of Breath  . Penicillins Hives and Itching    Has patient had a PCN reaction causing immediate rash, facial/tongue/throat swelling, SOB or lightheadedness with hypotension: unknown Has patient had a PCN reaction causing severe rash involving mucus membranes or skin necrosis: unknown Has patient had a PCN reaction that required hospitalization: unknown Has patient had a PCN reaction occurring within the last 10 years: unknown If all of the above answers are "NO", then may proceed with Cephalosporin use. Prior course of rocephin charted 05/2015    Medications: Patient's Medications  New Prescriptions   No medications on file  Previous Medications   ACETAMINOPHEN (TYLENOL) 325 MG TABLET    Take 650 mg by mouth 3 (three) times daily.   ALBUTEROL (PROVENTIL) (2.5 MG/3ML) 0.083% NEBULIZER SOLUTION    Take 3 mLs (2.5 mg total) by nebulization every 2 (two) hours as needed for wheezing.   AMANTADINE (SYMMETREL) 100 MG CAPSULE    Take 100 mg by mouth daily.    BACLOFEN (LIORESAL) 20 MG TABLET    Take 1 tablet (20 mg total) by mouth 3 (three) times daily as needed for muscle spasms.   CARVEDILOL (COREG)  25 MG TABLET    Take 1 tablet (25 mg total) by mouth 2 (two) times daily with a meal.   CHOLECALCIFEROL (VITAMIN D-3 PO)    Take 1,000 Int'l Units/day by mouth daily.    CRANBERRY PO    Take 100 mg by mouth daily.   DULOXETINE (CYMBALTA) 60 MG CAPSULE    Take 60 mg by mouth daily.   FAMOTIDINE (PEPCID) 20 MG TABLET    Take 1 tablet (20 mg total) by mouth 2 (two) times daily.   GABAPENTIN (NEURONTIN) 100 MG CAPSULE    Take 7 capsules (700 mg total) by mouth 2 (two) times daily.   HYDROCORTISONE CREAM 0.5 %    Apply 1 application topically 2 (two) times  daily.   LACTASE (LACTAID PO)    Take 1 tablet by mouth daily.   LACTULOSE 20 GM/30ML SOLN    Take 30 mLs by mouth daily.   LATANOPROST (XALATAN) 0.005 % OPHTHALMIC SOLUTION    Place 1 drop into both eyes at bedtime.   MAGNESIUM HYDROXIDE (MILK OF MAGNESIA) 400 MG/5ML SUSPENSION    Take 30 mLs by mouth every 8 (eight) hours as needed for mild constipation or moderate constipation.   MENTHOL, TOPICAL ANALGESIC, 154 MG PADS    Icy Hot Patch: Apply to left shoulder topically every 8 hours as needed for pain.   MIRABEGRON ER (MYRBETRIQ) 25 MG TB24 TABLET    Take 50 mg by mouth at bedtime.    ONDANSETRON (ZOFRAN) 4 MG TABLET    Take 1 tablet (4 mg total) by mouth every 6 (six) hours as needed for nausea.   POLYETHYLENE GLYCOL (MIRALAX / GLYCOLAX) PACKET    Take 17 g by mouth daily as needed for mild constipation.   POTASSIUM CHLORIDE (KLOR-CON) 20 MEQ PACKET    Take 20 mEq by mouth daily.    PROPYLENE GLYCOL (SYSTANE BALANCE) 0.6 % SOLN    Place 1 drop into both eyes 2 (two) times daily.   SENNA-DOCUSATE (SENOKOT S) 8.6-50 MG TABLET    Take 1 tablet by mouth 2 (two) times daily.   SIMETHICONE PO    Take 40 mg by mouth every 6 (six) hours.   SPIRONOLACTONE (ALDACTONE) 25 MG TABLET    Take 25 mg by mouth daily. For CHF   TAMSULOSIN (FLOMAX) 0.4 MG CAPS CAPSULE    Take 0.4 mg by mouth daily. For incontinence   TORSEMIDE (DEMADEX) 20 MG TABLET    Take 20 mg  by mouth daily.   ZOLPIDEM (AMBIEN) 5 MG TABLET    Take 1 tablet (5 mg total) by mouth at bedtime as needed for sleep.  Modified Medications   No medications on file  Discontinued Medications   RAMELTEON (ROZEREM) 8 MG TABLET    Take 8 mg by mouth at bedtime.    Review of Systems  Unable to perform ROS: Psychiatric disorder    Vitals:   11/19/16 1157  BP: 122/74  Pulse: 92  Resp: (!) 22  Temp: 98.6 F (37 C)  TempSrc: Oral  SpO2: 95%  Weight: 234 lb (106.1 kg)  Height: 5\' 5"  (1.651 m)   Body mass index is 38.94 kg/m.  Physical Exam  Constitutional: She appears well-developed. No distress.  Frail appearing in NAD. Lying in bed  HENT:  Mouth/Throat: Oropharynx is clear and moist. Mucous membranes are dry. No oropharyngeal exudate or posterior oropharyngeal erythema.  MMM  Eyes: EOM are normal. Pupils are equal, round, and reactive to light. No scleral icterus.  Neck: Neck supple. Carotid bruit is not present. No tracheal deviation present. No thyromegaly present.  Cardiovascular: Normal rate, regular rhythm and intact distal pulses.  Exam reveals no gallop and no friction rub.   Murmur (1/6 SEM) heard. Trace LE edema b/l. No calf TTP.   Pulmonary/Chest: Effort normal and breath sounds normal. No stridor. No respiratory distress. She has no wheezes. She has no rhonchi. She has no rales.    Abdominal: Soft. Bowel sounds are normal. She exhibits distension. She exhibits no mass. There is no hepatomegaly. There is no tenderness. There is no rebound and no guarding.  Genitourinary: No vaginal discharge found.  Genitourinary Comments: Deferred due to pt preference  Musculoskeletal: She exhibits edema and  tenderness.  Lymphadenopathy:    She has no cervical adenopathy.  Neurological: She is alert.  Paraplegic with b/l foot drop  Skin: Skin is warm and dry. No rash noted.  Psychiatric: Her behavior is normal. Thought content normal. Her speech is slurred. She exhibits a  depressed mood.     Labs reviewed: Nursing Home on 10/12/2016  Component Date Value Ref Range Status  . Triglycerides 08/09/2016 109  40 - 160 mg/dL Final  . Cholesterol 08/09/2016 159  0 - 200 mg/dL Final  . HDL 08/09/2016 28* 35 - 70 mg/dL Final  . LDL Cholesterol 08/09/2016 109  mg/dL Final  Abstract on 09/21/2016  Component Date Value Ref Range Status  . HM Mammogram 09/20/2016 0-4 Bi-Rad  0-4 Bi-Rad, Self Reported Normal Final   [facility]:The 1cm oval mass in the left breast in indeterminate.  Nursing Home on 08/29/2016  Component Date Value Ref Range Status  . Hemoglobin 09/10/2016 10.5* 12.0 - 16.0 g/dL Final  . HCT 09/10/2016 33* 36 - 46 % Final  . Platelets 09/10/2016 253  150 - 399 K/L Final  . WBC 09/10/2016 4.9  10^3/mL Final  . Glucose 09/10/2016 164  mg/dL Final  . BUN 09/10/2016 14  4 - 21 mg/dL Final  . Creatinine 09/10/2016 1.1  0.5 - 1.1 mg/dL Final  . Potassium 09/10/2016 3.8  3.4 - 5.3 mmol/L Final  . Sodium 09/10/2016 143  137 - 147 mmol/L Final  . Triglycerides 09/10/2016 144  40 - 160 mg/dL Final  . Cholesterol 09/10/2016 200  0 - 200 mg/dL Final  . HDL 09/10/2016 29* 35 - 70 mg/dL Final  . LDL Cholesterol 09/10/2016 142  mg/dL Final  . Alkaline Phosphatase 09/10/2016 139* 25 - 125 U/L Final  . ALT 09/10/2016 17  7 - 35 U/L Final  . AST 09/10/2016 16  13 - 35 U/L Final  . Bilirubin, Total 09/10/2016 0.2  mg/dL Final   <    No results found.   Assessment/Plan   ICD-10-CM   1. Encounter for well adult exam with abnormal findings Z00.01   2. Acute vaginitis N76.0   3. Neurogenic bladder N31.9    with malodorous urine  4. MULTIPLE SCLEROSIS, PROGRESSIVE/RELAPSING G35   5. Class 2 obesity due to excess calories with body mass index (BMI) of 38.0 to 38.9 in adult, unspecified whether serious comorbidity present E66.09    Z68.38   6. Adrenal insufficiency (Addison's disease) ([facility]) E27.1   7. Chronic combined systolic and diastolic congestive heart  failure ([facility]) I50.42   8. Insomnia, unspecified type G47.00   9. Paraplegia ([facility]) G82.20   10. Chronic pain syndrome G89.4     Diflucan 150mg  po today and repeat in 1 week for vaginitis  Check UA via straight cath  Cont current meds as ordered  Discussed obesity and pt's difficulty in losing weight as she is paraplegic. She has poor exercise tolerance. Discussed whether she is a candidate for weight loss sx. She likely is not due to her multiple comorbidities, especially MS. May need to seek bariatric sx for further discussion about wt loss options that would be best for her. She will think more about it  PT/OT/ST as indicated  Nutritional supplements as indicated  Will follow   Michele Judy S. Perlie Gold  [facility] [address] [place], [place] [address] [phone] Cell (Monday-Friday 8 AM - 5 PM) [phone] After 5 PM and follow  prompts

## 2016-11-20 DIAGNOSIS — R1311 Dysphagia, oral phase: Secondary | ICD-10-CM | POA: Diagnosis not present

## 2016-11-20 DIAGNOSIS — Z79899 Other long term (current) drug therapy: Secondary | ICD-10-CM | POA: Diagnosis not present

## 2016-11-20 DIAGNOSIS — G35 Multiple sclerosis: Secondary | ICD-10-CM | POA: Diagnosis not present

## 2016-11-20 DIAGNOSIS — N390 Urinary tract infection, site not specified: Secondary | ICD-10-CM | POA: Diagnosis not present

## 2016-11-20 DIAGNOSIS — R319 Hematuria, unspecified: Secondary | ICD-10-CM | POA: Diagnosis not present

## 2016-11-20 DIAGNOSIS — R531 Weakness: Secondary | ICD-10-CM | POA: Diagnosis not present

## 2016-11-20 DIAGNOSIS — E039 Hypothyroidism, unspecified: Secondary | ICD-10-CM | POA: Diagnosis not present

## 2016-11-21 DIAGNOSIS — R1311 Dysphagia, oral phase: Secondary | ICD-10-CM | POA: Diagnosis not present

## 2016-11-21 DIAGNOSIS — G35 Multiple sclerosis: Secondary | ICD-10-CM | POA: Diagnosis not present

## 2016-11-21 DIAGNOSIS — R531 Weakness: Secondary | ICD-10-CM | POA: Diagnosis not present

## 2016-11-22 DIAGNOSIS — G35 Multiple sclerosis: Secondary | ICD-10-CM | POA: Diagnosis not present

## 2016-11-22 DIAGNOSIS — R1311 Dysphagia, oral phase: Secondary | ICD-10-CM | POA: Diagnosis not present

## 2016-11-22 DIAGNOSIS — R531 Weakness: Secondary | ICD-10-CM | POA: Diagnosis not present

## 2016-11-23 DIAGNOSIS — R531 Weakness: Secondary | ICD-10-CM | POA: Diagnosis not present

## 2016-11-23 DIAGNOSIS — G35 Multiple sclerosis: Secondary | ICD-10-CM | POA: Diagnosis not present

## 2016-11-23 DIAGNOSIS — R1311 Dysphagia, oral phase: Secondary | ICD-10-CM | POA: Diagnosis not present

## 2016-11-26 DIAGNOSIS — G35 Multiple sclerosis: Secondary | ICD-10-CM | POA: Diagnosis not present

## 2016-11-26 DIAGNOSIS — R1311 Dysphagia, oral phase: Secondary | ICD-10-CM | POA: Diagnosis not present

## 2016-11-26 DIAGNOSIS — R531 Weakness: Secondary | ICD-10-CM | POA: Diagnosis not present

## 2016-11-27 DIAGNOSIS — R531 Weakness: Secondary | ICD-10-CM | POA: Diagnosis not present

## 2016-11-27 DIAGNOSIS — R1311 Dysphagia, oral phase: Secondary | ICD-10-CM | POA: Diagnosis not present

## 2016-11-27 DIAGNOSIS — G35 Multiple sclerosis: Secondary | ICD-10-CM | POA: Diagnosis not present

## 2016-12-04 ENCOUNTER — Other Ambulatory Visit: Payer: Self-pay | Admitting: Neurology

## 2016-12-04 DIAGNOSIS — G35 Multiple sclerosis: Secondary | ICD-10-CM | POA: Diagnosis not present

## 2016-12-04 DIAGNOSIS — G8220 Paraplegia, unspecified: Secondary | ICD-10-CM

## 2016-12-04 MED ORDER — CLONAZEPAM 0.5 MG PO TABS: ORAL_TABLET | ORAL | 5 refills | Status: DC

## 2016-12-11 IMAGING — MR MR HEAD WO/W CM
11 of 14 series · 34 of 48 positions shown · IV contrast (multihance)
Comparison: 07/10/2015 head CT. 06/08/2015, 10/08/2014 and
09/04/2012 brain MR.

ADDENDUM:
Progressive opacification left petrous apex without surrounding
abnormal enhancement. No obstructing lesion of the eustachian tube
identified.
CLINICAL DATA: 54-year-old hypertensive female with multiple
sclerosis and mental status changes. Subsequent encounter.

EXAM:
MRI HEAD WITHOUT AND WITH CONTRAST
TECHNIQUE: Multiplanar, multiecho pulse sequences of the brain and surrounding
structures were obtained without and with intravenous contrast.
CONTRAST:  19 cc MultiHance.

[Series 3: T1 · sagittal · 5.0mm · 0.47mm/px · 2 of 23 slices shown]
[im 1/23]
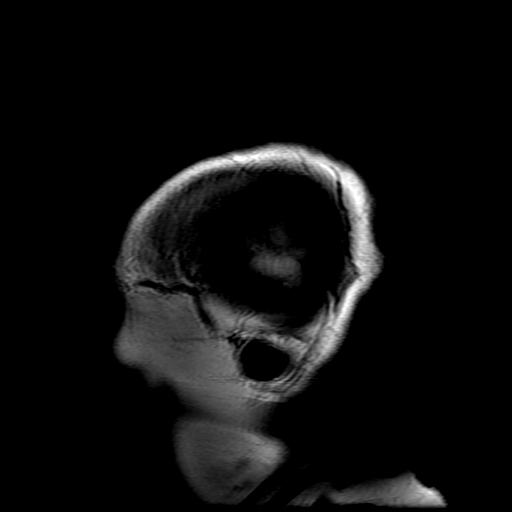
[im 23/23]
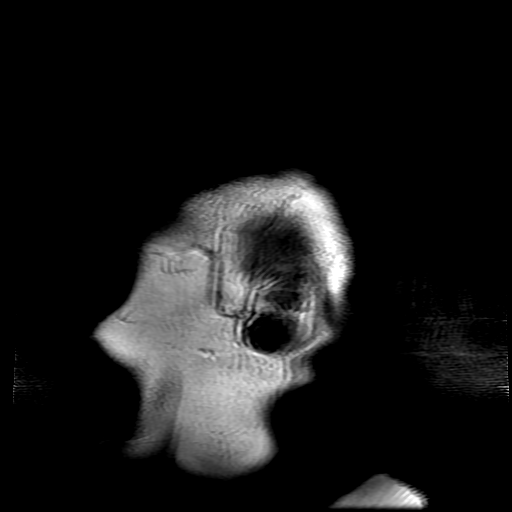

[Series 4: DWI · axial · 3.0mm · 1.09mm/px · z∈[-51,+84]mm · 7 of 92 slices shown (1 of 5)]
[im 1/92]
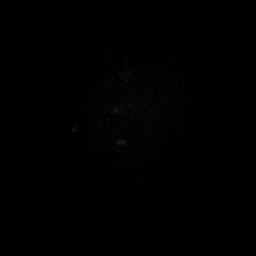
[im 16/92]
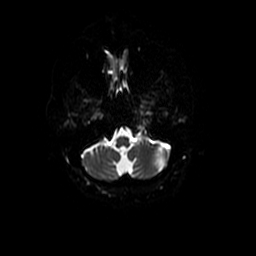
[im 31/92]
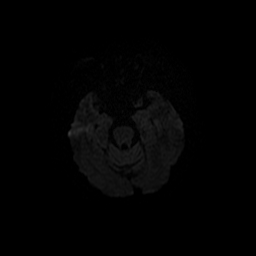
[im 46/92]
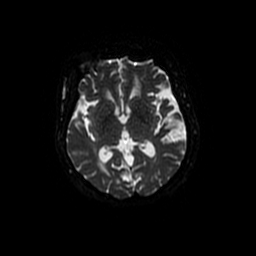
[im 61/92]
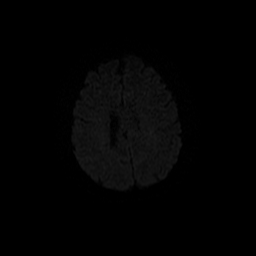
[im 76/92]
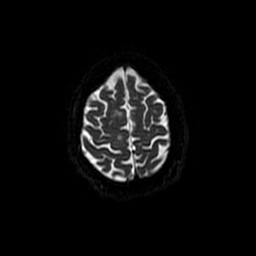
[im 92/92]
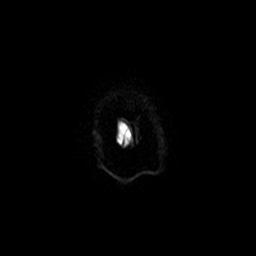

[Series 5: T2 · axial · 5.0mm · 0.43mm/px · z∈[-53,+85]mm · 2 of 24 slices shown (1 of 2)]
[im 1/24]
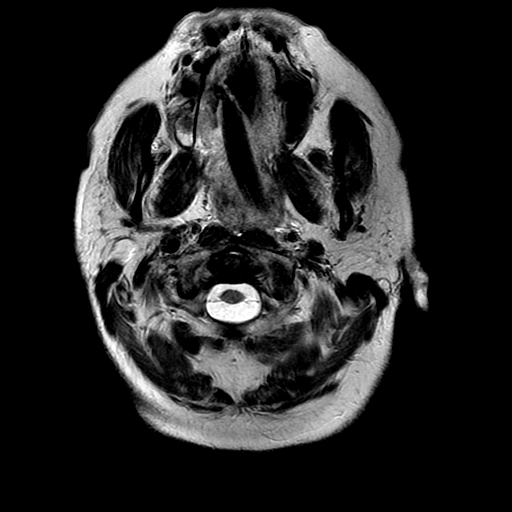
[im 24/24]
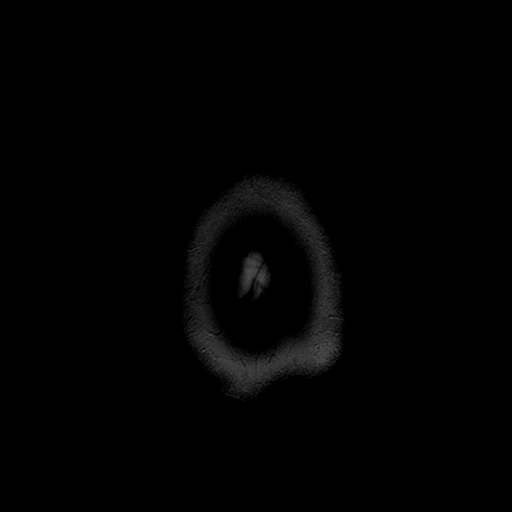

[Series 7: FLAIR · axial · 5.0mm · 0.43mm/px · z∈[-57,+80]mm · 2 of 24 slices shown]
[im 1/24]
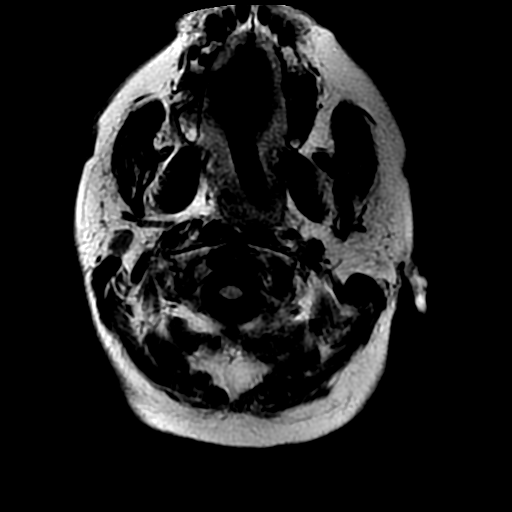
[im 24/24]
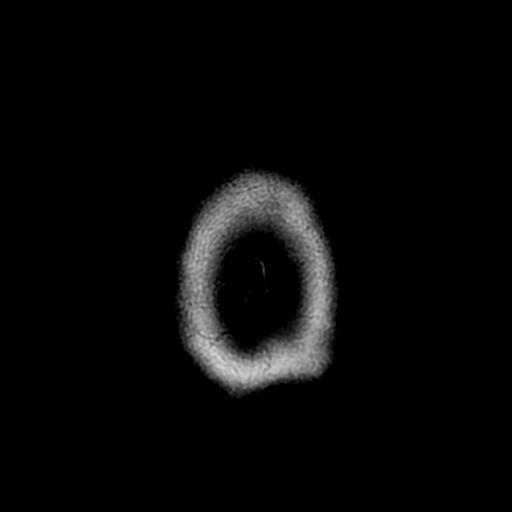

[Series 11: DWI · coronal · 5.0mm · 1.09mm/px · 5 of 66 slices shown (2 of 5)]
[im 1/66]
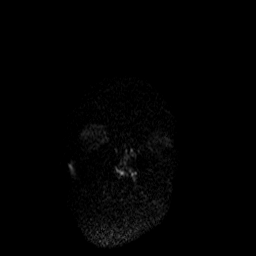
[im 17/66]
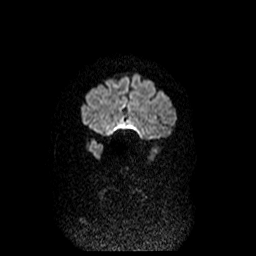
[im 33/66]
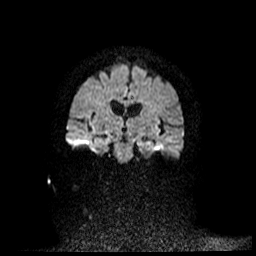
[im 49/66]
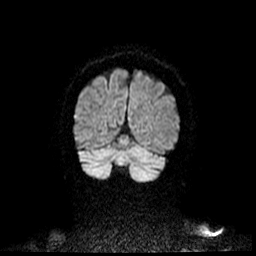
[im 66/66]
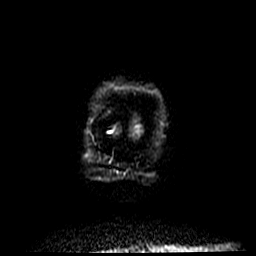

[Series 12: T2 · coronal · 5.0mm · 0.43mm/px · 2 of 27 slices shown (2 of 2)]
[im 1/27]
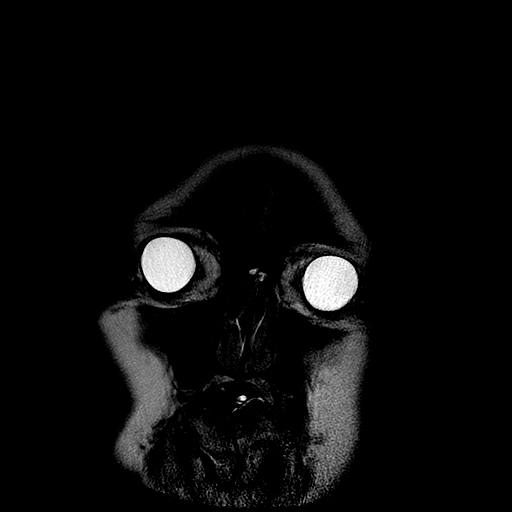
[im 27/27]
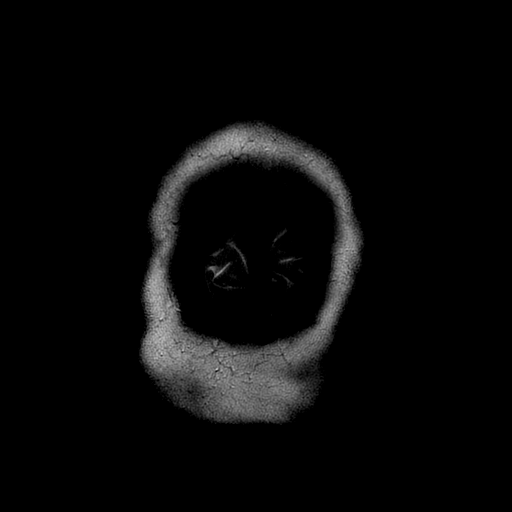

[Series 14: T1 post-contrast · coronal · 5.0mm · 0.47mm/px · 2 of 25 slices shown (1 of 2)]
[im 1/25]
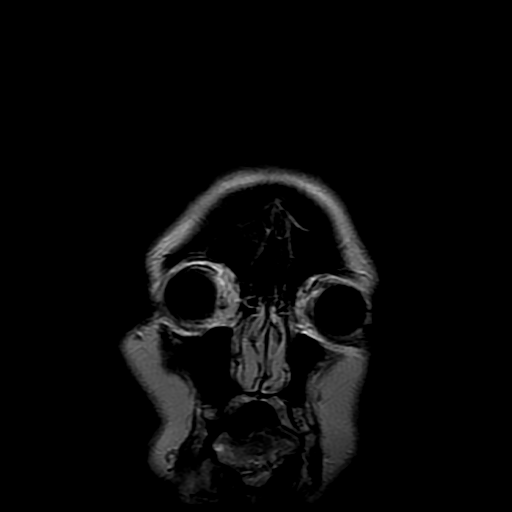
[im 25/25]
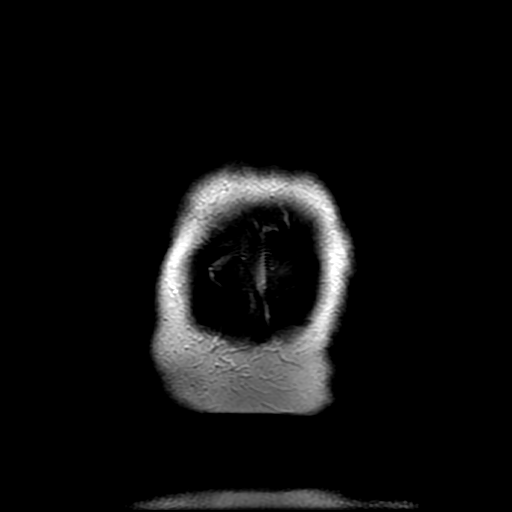

[Series 15: T1 post-contrast · sagittal · 5.0mm · 0.47mm/px · 2 of 23 slices shown (2 of 2)]
[im 1/23]
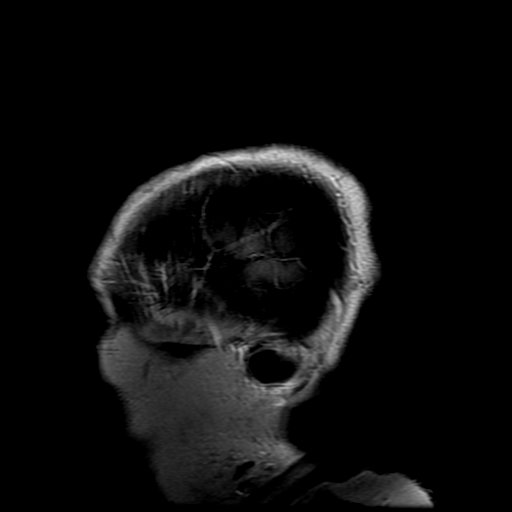
[im 23/23]
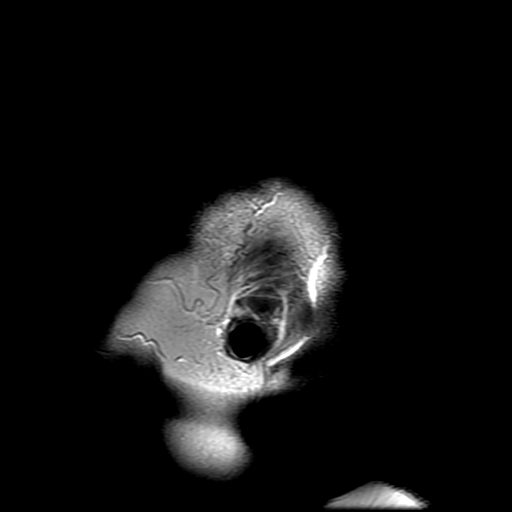

[Series 400: DWI · axial · 3.0mm · 1.09mm/px · z∈[-51,+84]mm · 4 of 46 slices shown (3 of 5)]
[im 1/46]
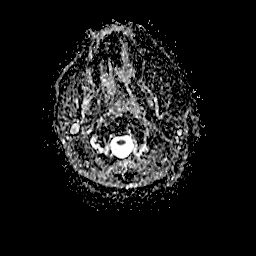
[im 16/46]
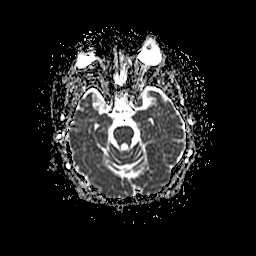
[im 31/46]
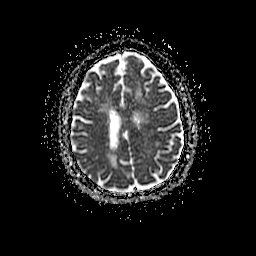
[im 46/46]
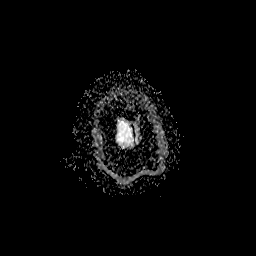

[Series 600: DWI · coronal · 5.0mm · 1.09mm/px · 3 of 33 slices shown (4 of 5)]
[im 1/33]
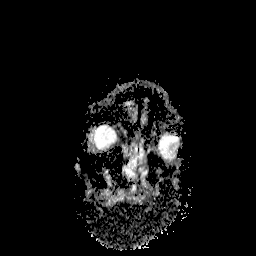
[im 17/33]
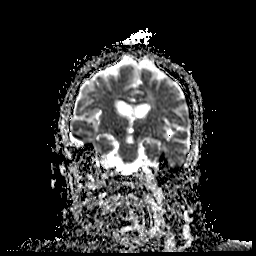
[im 33/33]
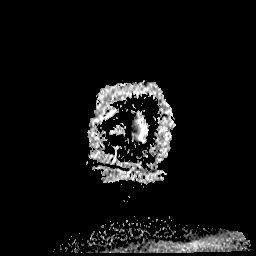

[Series 1100: DWI · coronal · 5.0mm · 1.09mm/px · 3 of 33 slices shown (5 of 5)]
[im 1/33]
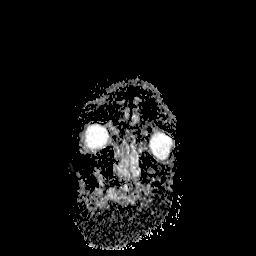
[im 17/33]
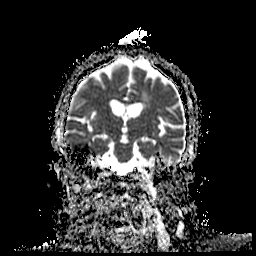
[im 33/33]
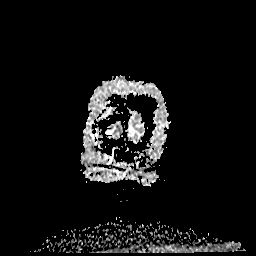

[34 of 48 positions shown; findings below may reference images not displayed]

FINDINGS: Exam is motion degraded.

No acute infarct or intracranial hemorrhage.

Prominent white matter changes consistent with patient's history
multiple sclerosis. No significant change from prior exams. No
interval growth to suggest progressive multifocal
leukoencephalopathy. No restricted motion or enhancement to suggest
active demyelination.

1.2 x 0.5 x 0.9 cm right-sided pituitary mass without change.
Infundibulum remains deviated to the left. No other intracranial
mass or abnormal enhancement.

Nonspecific 1 cm right parotid lesion unchanged.

Global atrophy without hydrocephalus.

Mild exophthalmos.

Minimal mucosal thickening ethmoid sinus air cells.

Major intracranial vascular structures are patent.
IMPRESSION: Exam is motion degraded.

No acute infarct or intracranial hemorrhage.

Prominent white matter changes consistent with patient's history of
multiple sclerosis. No significant change from prior exams. No
interval growth to suggest progressive multifocal
leukoencephalopathy. No restricted motion or enhancement to suggest
active demyelination.

1.2 x 0.5 x 0.9 cm right-sided pituitary mass without change.
Infundibulum remains deviated to the left. No other intracranial
mass or abnormal enhancement.

Global atrophy without hydrocephalus.

Nonspecific 1 cm right parotid lesion unchanged.

## 2016-12-11 IMAGING — RF DG FLUORO GUIDE LUMBAR PUNCTURE
1 series · 1 of 1 positions shown · non-contrast
Comparison: none

CLINICAL DATA: 54-year-old female with history of multiple
sclerosis. Altered mental status.

[Series 1: cp_standard · 0.29mm/px · 1 of 1 slices shown]
[im 1/1]
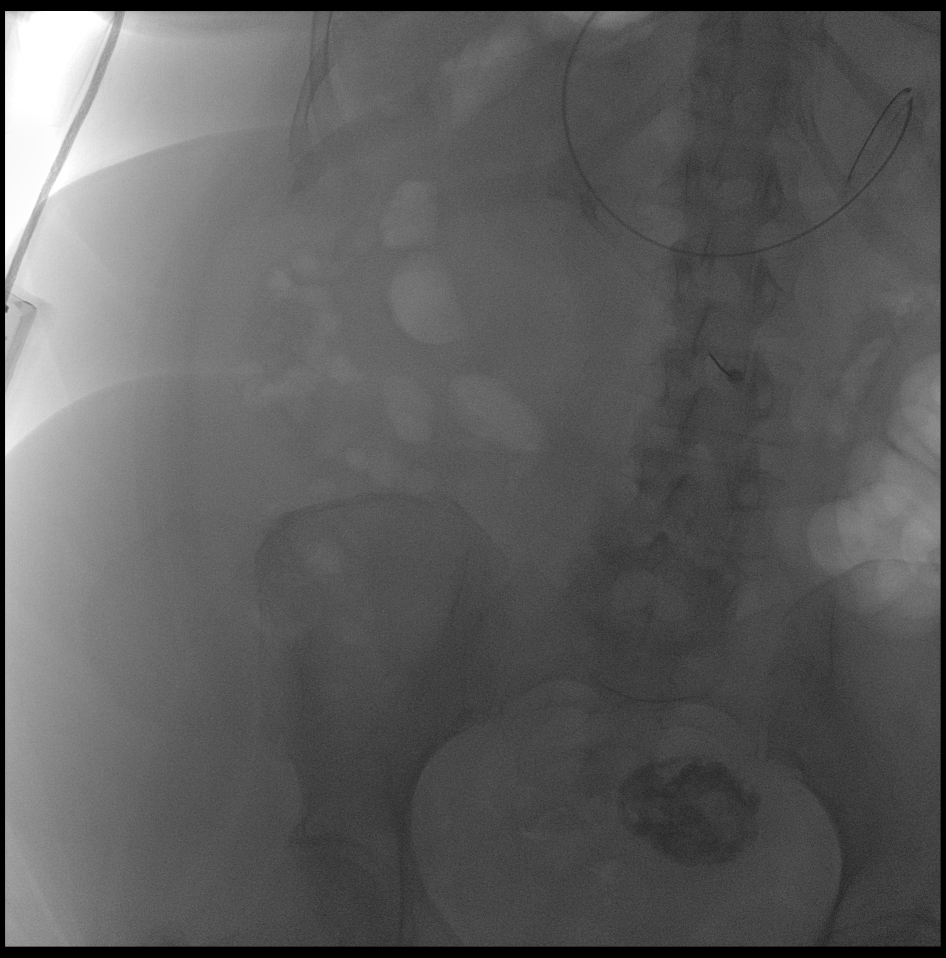

[1 of 1 positions shown; findings below may reference images not displayed]

EXAM:
DIAGNOSTIC LUMBAR PUNCTURE UNDER FLUOROSCOPIC GUIDANCE

FLUOROSCOPY TIME:  Radiation Exposure Index (as provided by the
fluoroscopic device): 37.34 micro Gray

PROCEDURE:
Witnessed informed consent was obtained from the patient's sister
prior to the procedure (by phone as patient not able to give consent
and patient is daughter is not available), including potential
complications of headache, bleeding, allergy, and pain. With the
patient prone and on a ventilator, the lower back was prepped with
Betadine. 1% Lidocaine was used for local anesthesia. Lumbar
puncture was performed at the L2-3 level with a single pass of a 22
gauge needle with return of clear CSF with an opening pressure of 11
cm water. Nine ml of CSF were obtained for laboratory studies. The
patient tolerated the procedure well and there were no apparent
complications.
IMPRESSION: Successful fluoroscopic guided L2-3 lumbar puncture with collection
of 9 cc of clear cerebral spinal fluid which was sent for labs as
per request.

## 2016-12-12 ENCOUNTER — Encounter: Payer: Self-pay | Admitting: Adult Health

## 2016-12-12 ENCOUNTER — Non-Acute Institutional Stay (SKILLED_NURSING_FACILITY): Payer: Medicare Other | Admitting: Adult Health

## 2016-12-12 DIAGNOSIS — Z79899 Other long term (current) drug therapy: Secondary | ICD-10-CM | POA: Diagnosis not present

## 2016-12-12 DIAGNOSIS — E039 Hypothyroidism, unspecified: Secondary | ICD-10-CM | POA: Diagnosis not present

## 2016-12-12 DIAGNOSIS — G35 Multiple sclerosis: Secondary | ICD-10-CM | POA: Diagnosis not present

## 2016-12-12 DIAGNOSIS — R319 Hematuria, unspecified: Secondary | ICD-10-CM | POA: Diagnosis not present

## 2016-12-12 DIAGNOSIS — N390 Urinary tract infection, site not specified: Secondary | ICD-10-CM | POA: Diagnosis not present

## 2016-12-12 DIAGNOSIS — N3000 Acute cystitis without hematuria: Secondary | ICD-10-CM

## 2016-12-12 IMAGING — CR DG CHEST 1V PORT
1 series · 1 of 1 positions shown · non-contrast
Comparison: 07/11/2015.

CLINICAL DATA: Acute respiratory failure.

EXAM:
PORTABLE CHEST 1 VIEW

[AP]
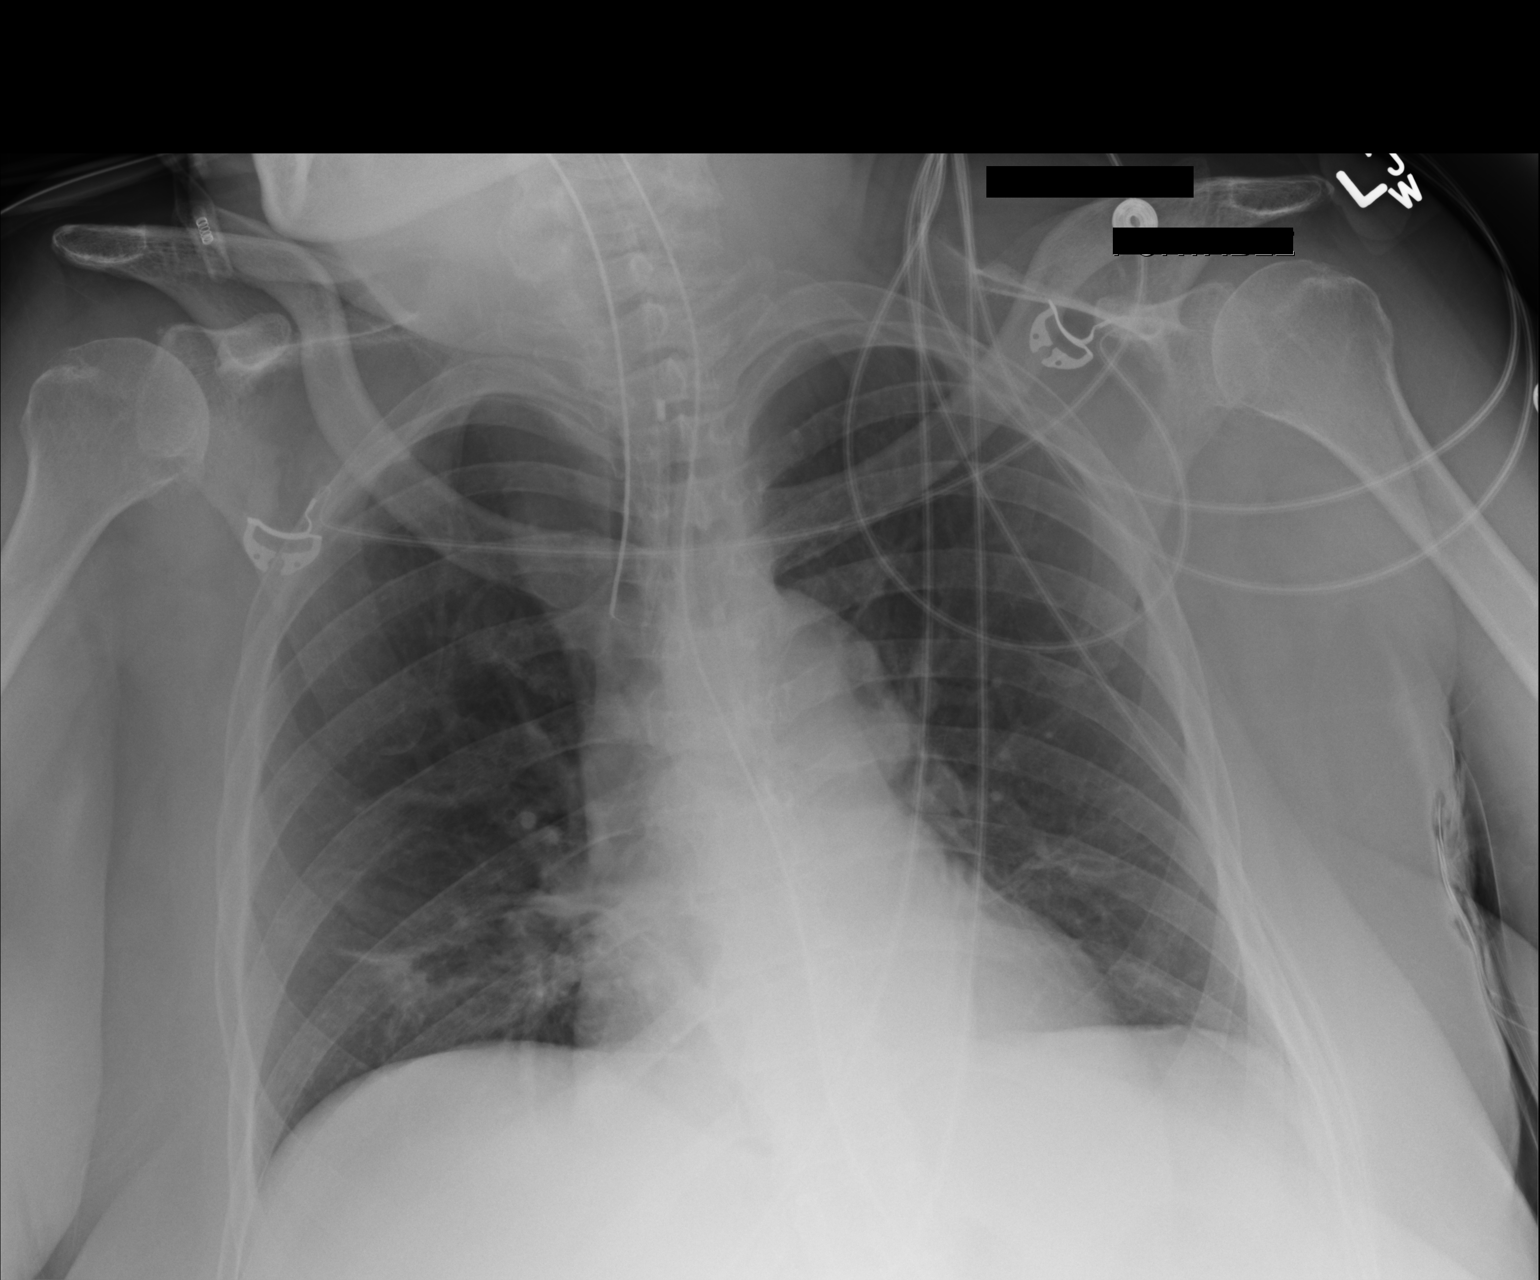

[1 of 1 positions shown; findings below may reference images not displayed]

FINDINGS: Endotracheal tube and NG tube noted in good anatomic position.
Mediastinum hilar structures normal. Low lung volumes with mild
bibasilar subsegmental atelectasis and/or infiltrates slightly
progressed from prior exam. Heart size normal. No pleural effusion
or pneumothorax .
IMPRESSION: 1. Lines and tubes in stable position.
2. Low lung volumes with mild bibasilar subsegmental atelectasis
and/or infiltrates with slight progression from prior exam .

## 2016-12-12 NOTE — Progress Notes (Signed)
Location:   [facility] Room Number: [place] of Service:  SNF (31)   CODE STATUS: DNR  Allergies  Allergen Reactions  . Sulfonamide Derivatives Hives and Shortness Of Breath  . Penicillins Hives and Itching    Has patient had a PCN reaction causing immediate rash, facial/tongue/throat swelling, SOB or lightheadedness with hypotension: unknown Has patient had a PCN reaction causing severe rash involving mucus membranes or skin necrosis: unknown Has patient had a PCN reaction that required hospitalization: unknown Has patient had a PCN reaction occurring within the last 10 years: unknown If all of the above answers are "NO", then may proceed with Cephalosporin use. Prior course of rocephin charted 05/2015    Chief Complaint  Patient presents with  . Acute Visit    Bladder Pain    HPI:  She is complaining of bladder pain and tells me that her urine has a foul odor. There are no reports of fever present. She tells me that she had recently received treatment of an uti. She is concerned that she could have another infection.    Past Medical History:  Diagnosis Date  . Cardiomyopathy ([place])    a.  Echo 04/29/12: Mild LVH, EF 20-25%, mild AI, moderate MR, moderate LAE, mild RAE, mild RVE, moderate TR, PASP 51, small pericardial effusion;   b. probably non-ischemic given multiple chemo-Tx agents used for MS and global LV dysfn on echo  . Chronic systolic heart failure ([place])   . Depression   . Glaucoma   . Hypertension   . MS (multiple sclerosis) ([place])    a. Dx'd late 20's. b. Tx with Novantrone, Tysabri, Copaxone previously.    Past Surgical History:  Procedure Laterality Date  . ABLATION     uterine  . CESAREAN SECTION      Social History   Social History  . Marital status: Single    Spouse name: N/A  . Number of children: N/A  . Years of education: N/A   Occupational History  . Not on file.   Social History Main Topics  . Smoking status: Former  Research scientist (life sciences)  . Smokeless tobacco: Never Used  . Alcohol use No  . Drug use: No  . Sexual activity: Not Currently   Other Topics Concern  . Not on file   Social History Narrative  . No narrative on file   Family History  Problem Relation Age of Onset  . Hypertension Mother   . Cancer Mother        breast   . Cancer Father        prostate  . Multiple sclerosis Sister   . Heart attack Neg Hx       VITAL SIGNS BP 136/80   Pulse 85   Temp 98.7 F (37.1 C)   Resp 17   Ht 5\' 5"  (1.651 m)   Wt 248 lb 8 oz (112.7 kg)   SpO2 95%   BMI 41.35 kg/m   Patient's Medications  New Prescriptions   No medications on file  Previous Medications   ACETAMINOPHEN (TYLENOL) 325 MG TABLET    Take 650 mg by mouth 3 (three) times daily.   ALBUTEROL (PROVENTIL) (2.5 MG/3ML) 0.083% NEBULIZER SOLUTION    Take 3 mLs (2.5 mg total) by nebulization every 2 (two) hours as needed for wheezing.   AMANTADINE (SYMMETREL) 100 MG CAPSULE    Take 100 mg by mouth daily.    BACLOFEN (LIORESAL) 20 MG TABLET    Take  1 tablet (20 mg total) by mouth 3 (three) times daily as needed for muscle spasms.   CARVEDILOL (COREG) 25 MG TABLET    Take 1 tablet (25 mg total) by mouth 2 (two) times daily with a meal.   CHOLECALCIFEROL (VITAMIN D-3 PO)    Take 1,000 Int'l Units/day by mouth at bedtime.    CRANBERRY PO    Take 100 mg by mouth at bedtime.    DULOXETINE (CYMBALTA) 60 MG CAPSULE    Take 60 mg by mouth daily.   FAMOTIDINE (PEPCID) 20 MG TABLET    Take 1 tablet (20 mg total) by mouth 2 (two) times daily.   GABAPENTIN (NEURONTIN) 100 MG CAPSULE    Take 7 capsules (700 mg total) by mouth 2 (two) times daily.   LACTASE (LACTAID PO)    Take 1 tablet by mouth daily.   LACTULOSE 20 GM/30ML SOLN    Take 30 mLs by mouth daily.   LATANOPROST (XALATAN) 0.005 % OPHTHALMIC SOLUTION    Place 1 drop into both eyes at bedtime.   MAGNESIUM HYDROXIDE (MILK OF MAGNESIA) 400 MG/5ML SUSPENSION    Take 30 mLs by mouth every 8 (eight)  hours as needed for mild constipation or moderate constipation.   MENTHOL, TOPICAL ANALGESIC, 154 MG PADS    Icy Hot Patch: Apply to left shoulder topically every 8 hours as needed for pain.   MIRABEGRON ER (MYRBETRIQ) 25 MG TB24 TABLET    Take 50 mg by mouth at bedtime.    ONDANSETRON (ZOFRAN) 4 MG TABLET    Take 1 tablet (4 mg total) by mouth every 6 (six) hours as needed for nausea.   POLYETHYLENE GLYCOL (MIRALAX / GLYCOLAX) PACKET    Take 17 g by mouth daily as needed for mild constipation.   POTASSIUM CHLORIDE (KLOR-CON) 20 MEQ PACKET    Take 20 mEq by mouth daily.    SENNA-DOCUSATE (SENOKOT S) 8.6-50 MG TABLET    Take 1 tablet by mouth 2 (two) times daily.   SIMETHICONE PO    Take 40 mg by mouth every 6 (six) hours.   SPIRONOLACTONE (ALDACTONE) 25 MG TABLET    Take 25 mg by mouth daily. For CHF   TAMSULOSIN (FLOMAX) 0.4 MG CAPS CAPSULE    Take 0.4 mg by mouth daily. For incontinence   TORSEMIDE (DEMADEX) 20 MG TABLET    Take 20 mg by mouth daily.   ZOLPIDEM (AMBIEN CR) 6.25 MG CR TABLET    Take 6.25 mg by mouth at bedtime.  Modified Medications   No medications on file  Discontinued Medications   CLONAZEPAM (KLONOPIN) 0.5 MG TABLET    One po tid prn spasticity   HYDROCORTISONE CREAM 0.5 %    Apply 1 application topically 2 (two) times daily.   PROPYLENE GLYCOL (SYSTANE BALANCE) 0.6 % SOLN    Place 1 drop into both eyes 2 (two) times daily.     SIGNIFICANT DIAGNOSTIC EXAMS  10-08-14: MRI brain: No significant change in diffuse white matter lesions consistent with patient's history of multiple sclerosis. None of these areas demonstrate abnormal restriction or enhancement as may be seen with areas of acute demyelination. Global atrophy. 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 Right parotid 1 cm lesion does not have typical appearance of an intra parotid lymph node and therefore primary parotid lesion is a possibility. ENT  consultation may be considered for further delineation.  10-08-14: mri of cervical; spine: Scattered new areas of altered signal intensity (compared to 2007) throughout the cervical cord as detailed above consistent with patient's history of multiple sclerosis. None of these areas demonstrate enhancement as may be seen with areas of active demyelination. Cervical spondylotic changes as detailed above most prominent C5-6 level.  12-13-14: ABI: right: mild atherosclerotic disease   01-10-15: ct of head: No acute intracranial abnormalities. Mild atrophy. White matter disease compatible with history of MS.  01-10-15: chest x-ray: No active disease  01-12-15: 2-d echo: Left ventricle: The cavity size was normal. There was mild concentric hypertrophy. Systolic function was normal. The estimated ejection fraction was in the range of 60% to 65%. Wall motion was normal; there were no regional wall motion abnormalities. Doppler parameters are consistent with abnormal left ventricular relaxation (grade 1 diastolic dysfunction). - Aortic valve: There was mild regurgitation. - Mitral valve: Calcified annulus. There was mild regurgitation.  05-30-15: chest x-ray: No active disease.  07-14-15: EEG: This EEG is consistent with a mild generalized non-specific cerebral dysfunction(encephalopathy).   07-20-15: chest x-ray; Interval extubation. Decreasing lung volumes with increasing bibasilar opacities, likely atelectasis.  11-07-15: ct of head; No acute intracranial abnormality. White matter changes are suggestive for chronic small vessel ischemic disease. Right maxillary sinus disease.     LABS REVIEWED:   03-03-16: wbc 5.3; hgb 10.0; hct 32.6; mcv 81.9; plt 242; glucose 149; bun 15.2; creat 1.26; k+ 3.4; na++ 143; ast 140; albumin 3.9; vit B 12: >2000 03-23-16: glucose 94; bun 18.5; creat 1.42; k+ 4.1; na++ 142  08-09-16: chol 159; ldl 109; trig 109; hdl 28  09-10-16: wbc 4.9; hgb 10.5; hct 33.2; mcv 86.6; plt 253   glucose 164; bun 9.0; creat 1.12; k+ 3.8; na++ 14; liver normal albumin 3.8 chol 200; ldl 142; trig 144; hdl 29  11-20-16: urine culture: providencia stuartii: fosfomycin 3 gm   Review of Systems  Constitutional: Negative for malaise/fatigue.  Respiratory: Negative for cough and shortness of breath.   Cardiovascular: Negative for chest pain, palpitations and leg swelling.  Gastrointestinal: Negative for heartburn, abdominal pain and constipation.  Has bladder pain  Musculoskeletal: has generalized aches and pains.   Skin: Negative.   Neurological: Negative for dizziness.  Psychiatric/Behavioral: no depression  has insomnia     Physical Exam Constitutional: . No distress.  Obese   Neck: Neck supple. No JVD present. No thyromegaly present.  Cardiovascular: Normal rate, regular rhythm and intact distal pulses.   Respiratory: Effort normal and breath sounds normal. No respiratory distress.  GI: Soft. Bowel sounds are normal. She exhibits no distension. There is no tenderness.  Musculoskeletal: She exhibits no edema.  Is able to move right  upper extremity Limited movement in her lower extremities  and left upper extremity  Neurological: alert and oriented X 3   .  Skin: Skin is warm and dry. She is not diaphoretic.    ASSESSMENT/ PLAN:  1. UTI: will collect UA/C&S after specimen collected will begin pyridium 100 mg every 8 hours as needed and will begin rocephin 1 gm IM daily for 10 days pending urine culture results.   MD is aware of resident's narcotic use and is in agreement with current plan of care. We will attempt to wean resident as apropriate     Ok Edwards NP [facility]  Contact [phone] Monday through Friday 8am- 5pm  After hours call [phone]

## 2016-12-28 ENCOUNTER — Encounter: Payer: Self-pay | Admitting: Adult Health

## 2016-12-28 ENCOUNTER — Non-Acute Institutional Stay (SKILLED_NURSING_FACILITY): Payer: Medicare Other | Admitting: Adult Health

## 2016-12-28 DIAGNOSIS — G35 Multiple sclerosis: Secondary | ICD-10-CM

## 2016-12-28 DIAGNOSIS — I5042 Chronic combined systolic (congestive) and diastolic (congestive) heart failure: Secondary | ICD-10-CM | POA: Diagnosis not present

## 2016-12-28 DIAGNOSIS — K219 Gastro-esophageal reflux disease without esophagitis: Secondary | ICD-10-CM

## 2016-12-28 DIAGNOSIS — G894 Chronic pain syndrome: Secondary | ICD-10-CM

## 2016-12-28 DIAGNOSIS — N319 Neuromuscular dysfunction of bladder, unspecified: Secondary | ICD-10-CM

## 2016-12-28 DIAGNOSIS — I110 Hypertensive heart disease with heart failure: Secondary | ICD-10-CM

## 2016-12-28 NOTE — Progress Notes (Signed)
Location:   [place] Room Number: [place] of Service:  SNF (31)   CODE STATUS: DNR  Allergies  Allergen Reactions  . Sulfonamide Derivatives Hives and Shortness Of Breath  . Penicillins Hives and Itching    Has patient had a PCN reaction causing immediate rash, facial/tongue/throat swelling, SOB or lightheadedness with hypotension: unknown Has patient had a PCN reaction causing severe rash involving mucus membranes or skin necrosis: unknown Has patient had a PCN reaction that required hospitalization: unknown Has patient had a PCN reaction occurring within the last 10 years: unknown If all of the above answers are "NO", then may proceed with Cephalosporin use. Prior course of rocephin charted 05/2015    Chief Complaint  Patient presents with  . Medical Management of Chronic Issues    1 month follow up    HPI:  She is a 56 year old long term resident of this facility being seen for the management of her chronic illnesses.  She is not voicing any complaints at this time. She would like to have a GI consult and a female exam.   Past Medical History:  Diagnosis Date  . Cardiomyopathy ([place])    a.  Echo 04/29/12: Mild LVH, EF 20-25%, mild AI, moderate MR, moderate LAE, mild RAE, mild RVE, moderate TR, PASP 51, small pericardial effusion;   b. probably non-ischemic given multiple chemo-Tx agents used for MS and global LV dysfn on echo  . Chronic systolic heart failure ([place])   . Depression   . Glaucoma   . Hypertension   . MS (multiple sclerosis) ([place])    a. Dx'd late 20's. b. Tx with Novantrone, Tysabri, Copaxone previously.    Past Surgical History:  Procedure Laterality Date  . ABLATION     uterine  . CESAREAN SECTION      Social History   Social History  . Marital status: Single    Spouse name: N/A  . Number of children: N/A  . Years of education: N/A   Occupational History  . Not on file.   Social History Main Topics  . Smoking status:  Former Research scientist (life sciences)  . Smokeless tobacco: Never Used  . Alcohol use No  . Drug use: No  . Sexual activity: Not Currently   Other Topics Concern  . Not on file   Social History Narrative  . No narrative on file   Family History  Problem Relation Age of Onset  . Hypertension Mother   . Cancer Mother        breast   . Cancer Father        prostate  . Multiple sclerosis Sister   . Heart attack Neg Hx       VITAL SIGNS BP (!) 144/74   Pulse 76   Temp 98.1 F (36.7 C)   Resp 18   Ht 5\' 4"  (1.626 m)   Wt 248 lb 6.4 oz (112.7 kg)   BMI 42.64 kg/m   Patient's Medications  New Prescriptions   No medications on file  Previous Medications   ACETAMINOPHEN (TYLENOL) 325 MG TABLET    Take 650 mg by mouth 3 (three) times daily.   ALBUTEROL (PROVENTIL) (2.5 MG/3ML) 0.083% NEBULIZER SOLUTION    Take 3 mLs (2.5 mg total) by nebulization every 2 (two) hours as needed for wheezing.   AMANTADINE (SYMMETREL) 100 MG CAPSULE    Take 100 mg by mouth daily.    BACLOFEN (LIORESAL) 20 MG TABLET    Take  1 tablet (20 mg total) by mouth 3 (three) times daily as needed for muscle spasms.   CARVEDILOL (COREG) 25 MG TABLET    Take 1 tablet (25 mg total) by mouth 2 (two) times daily with a meal.   CHOLECALCIFEROL (VITAMIN D-3 PO)    Take 1,000 Int'l Units/day by mouth at bedtime.    CRANBERRY PO    Take 100 mg by mouth at bedtime.    DULOXETINE (CYMBALTA) 60 MG CAPSULE    Take 60 mg by mouth daily.   FAMOTIDINE (PEPCID) 20 MG TABLET    Take 1 tablet (20 mg total) by mouth 2 (two) times daily.   GABAPENTIN (NEURONTIN) 100 MG CAPSULE    Take 7 capsules (700 mg total) by mouth 2 (two) times daily.   HYDROCORTISONE CREAM 0.5 %    Apply to cheeks topically two times a day for dry areas, avoid eyes   LACTASE (LACTAID PO)    Take 1 tablet by mouth daily.   LACTULOSE 20 GM/30ML SOLN    Take 30 mLs by mouth daily as needed.    LATANOPROST (XALATAN) 0.005 % OPHTHALMIC SOLUTION    Place 1 drop into both eyes at  bedtime.   MAGNESIUM HYDROXIDE (MILK OF MAGNESIA) 400 MG/5ML SUSPENSION    Take 30 mLs by mouth every 8 (eight) hours as needed for mild constipation or moderate constipation.   MENTHOL, TOPICAL ANALGESIC, 154 MG PADS    Icy Hot Patch: Apply to left shoulder topically every 8 hours as needed for pain.   MIRABEGRON ER (MYRBETRIQ) 50 MG TB24 TABLET    Take 50 mg by mouth at bedtime.    ONDANSETRON (ZOFRAN) 4 MG TABLET    Take 1 tablet (4 mg total) by mouth every 6 (six) hours as needed for nausea.   PHENAZOPYRIDINE (PYRIDIUM) 100 MG TABLET    Take 100 mg by mouth every 8 (eight) hours as needed for pain.   POLYETHYLENE GLYCOL (MIRALAX / GLYCOLAX) PACKET    Take 17 g by mouth daily as needed for mild constipation.   POTASSIUM CHLORIDE (KLOR-CON) 20 MEQ PACKET    Take 20 mEq by mouth daily.    PROPYLENE GLYCOL (SYSTANE BALANCE) 0.6 % SOLN    Instill 1 drop in both eyes two times a day for dry eyes   SACCHAROMYCES BOULARDII (FLORASTOR) 250 MG CAPSULE    Take 250 mg by mouth 2 (two) times daily.   SENNA-DOCUSATE (SENOKOT S) 8.6-50 MG TABLET    Take 1 tablet by mouth 2 (two) times daily.   SIMETHICONE PO    Take 40 mg by mouth every 6 (six) hours.   SPIRONOLACTONE (ALDACTONE) 25 MG TABLET    Take 25 mg by mouth daily. For CHF   TAMSULOSIN (FLOMAX) 0.4 MG CAPS CAPSULE    Take 0.4 mg by mouth daily. For incontinence   TORSEMIDE (DEMADEX) 20 MG TABLET    Take 20 mg by mouth daily.   ZOLPIDEM (AMBIEN CR) 6.25 MG CR TABLET    Take 6.25 mg by mouth at bedtime.  Modified Medications   No medications on file  Discontinued Medications   No medications on file     SIGNIFICANT DIAGNOSTIC EXAMS   10-08-14: MRI brain: No significant change in diffuse white matter lesions consistent with patient's history of multiple sclerosis. None of these areas demonstrate abnormal restriction or enhancement as may be seen with areas of acute demyelination. Global atrophy. Decrease in size right pituitary mass now measuring  1.2 x 0.5 x 0.9 cm versus prior 1.2 x 0.7 x 1.1 cm. Right parotid 1 cm lesion does not have typical appearance of an intra parotid lymph node and therefore primary parotid lesion is a possibility. ENT consultation may be considered for further delineation.  10-08-14: mri of cervical; spine: Scattered new areas of altered signal intensity (compared to 2007) throughout the cervical cord as detailed above consistent with patient's history of multiple sclerosis. None of these areas demonstrate enhancement as may be seen with areas of active demyelination. Cervical spondylotic changes as detailed above most prominent C5-6 level.  12-13-14: ABI: right: mild atherosclerotic disease   01-10-15: ct of head: No acute intracranial abnormalities. Mild atrophy. White matter disease compatible with history of MS.  01-10-15: chest x-ray: No active disease  01-12-15: 2-d echo: Left ventricle: The cavity size was normal. There was mild concentric hypertrophy. Systolic function was normal. The estimated ejection fraction was in the range of 60% to 65%. Wall motion was normal; there were no regional wall motion abnormalities. Doppler parameters are consistent with abnormal left ventricular relaxation (grade 1 diastolic dysfunction). - Aortic valve: There was mild regurgitation. - Mitral valve: Calcified annulus. There was mild regurgitation.  05-30-15: chest x-ray: No active disease.  07-14-15: EEG: This EEG is consistent with a mild generalized non-specific cerebral dysfunction(encephalopathy).   07-20-15: chest x-ray; Interval extubation. Decreasing lung volumes with increasing bibasilar opacities, likely atelectasis.  11-07-15: ct of head; No acute intracranial abnormality. White matter changes are suggestive for chronic small vessel ischemic disease. Right maxillary sinus disease.     LABS REVIEWED:   03-03-16: wbc 5.3; hgb 10.0; hct 32.6; mcv 81.9; plt 242; glucose 149; bun 15.2; creat 1.26; k+ 3.4; na++ 143;  ast 140; albumin 3.9; vit B 12: >2000 03-23-16: glucose 94; bun 18.5; creat 1.42; k+ 4.1; na++ 142  08-09-16: chol 159; ldl 109; trig 109; hdl 28  09-10-16: wbc 4.9; hgb 10.5; hct 33.2; mcv 86.6; plt 253  glucose 164; bun 9.0; creat 1.12; k+ 3.8; na++ 14; liver normal albumin 3.8 chol 200; ldl 142; trig 144; hdl 29  11-20-16: urine culture: providencia stuartii: fosfomycin 3 gm 12-12-16: urine culture: providencia stuartii: rocephin    Review of Systems  Constitutional: Negative for malaise/fatigue.  Respiratory: Negative for cough and shortness of breath.   Cardiovascular: Negative for chest pain, palpitations and leg swelling.  Gastrointestinal: Negative for heartburn, abdominal pain and constipation.   Musculoskeletal: has generalized aches and pains which are managed.   Skin: Negative.   Neurological: Negative for dizziness.  Psychiatric/Behavioral: no depression     Physical Exam Constitutional: . No distress.  Obese   Neck: Neck supple. No JVD present. No thyromegaly present.  Cardiovascular: Normal rate, regular rhythm and intact distal pulses.   Respiratory: Effort normal and breath sounds normal. No respiratory distress.  GI: Soft. Bowel sounds are normal. She exhibits no distension. There is no tenderness.  Musculoskeletal: She exhibits no edema.  Is able to move right  upper extremity Limited movement in her lower extremities  and left upper extremity  Neurological: alert and oriented X 3   .  Skin: Skin is warm and dry. She is not diaphoretic.    ASSESSMENT/ PLAN:  1. MS: is slowly declining   Has been  followed by  neurology;  Has paraplegia.will continue amantadine 100 mg daily baclofen 20 mg tid prn  for muscle spasms;   Saw neurology on 09-26-16   2. Hypertension b/p144/74 : will continue  coreg 25 mg twice daily and will monitor   3. Chronic combined systolic and diastolic heart failure:EF is 60-65%(01-12-15)    is stable will continue demadex 20  mg daily; with k+ 20  meq daily; will continue aldactone 25 mg daily;    4. Neurogenic bladder: takes flomax 0.4 mg daily  myrbetriq 50 mg daily   I/O cath every 6 hours as needed   5. Depression: is presently taking cymbalta 60 mg daily; which also helps with her pain management.  ambien cr 6.25 mg nightly   6. Chronic pain:   will continue  neurontin 700 mg twice  daily; takes cymbalta 60 mg daily   tylenol 650 mg three times daily routinely   9. Constipation: will continue  miralax  daily as needed and senna s twice daily   10. Gerd: will continue pepcid 20 mg twice daily   11. Glaucoma: will continue xalatan to both eyes.       MD is aware of resident's narcotic use and is in agreement with current plan of care. We will attempt to wean resident as apropriate   Ok Edwards NP [facility]  Contact [phone] Monday through Friday 8am- 5pm  After hours call [phone]

## 2017-01-02 DIAGNOSIS — N390 Urinary tract infection, site not specified: Secondary | ICD-10-CM | POA: Insufficient documentation

## 2017-01-17 ENCOUNTER — Non-Acute Institutional Stay (SKILLED_NURSING_FACILITY): Payer: Medicare Other

## 2017-01-17 DIAGNOSIS — Z0000 Encounter for general adult medical examination without abnormal findings: Secondary | ICD-10-CM

## 2017-01-17 NOTE — Patient Instructions (Signed)
Shannon Garrett , Thank you for taking time to come for your Medicare Wellness Visit. I appreciate your ongoing commitment to your health goals. Please review the following plan we discussed and let me know if I can assist you in the future.   Screening recommendations/referrals: Colonoscopy up to date, long term pt Mammogram up to date, long term pt Bone Density due age 56 Recommended yearly ophthalmology/optometry visit for glaucoma screening and checkup Recommended yearly dental visit for hygiene and checkup  Vaccinations: Influenza vaccine up to date. Due 05/19/17 Pneumococcal vaccine up to date Tdap vaccine due Shingles vaccine not in records    Advanced directives: DNR in chart, need HCPOA and living will for chart  Conditions/risks identified: None  Next appointment: Dr .Eulas Post makes rounds  Preventive Care 40-64 Years, Female Preventive care refers to lifestyle choices and visits with your health care provider that can promote health and wellness. What does preventive care include?  A yearly physical exam. This is also called an annual well check.  Dental exams once or twice a year.  Routine eye exams. Ask your health care provider how often you should have your eyes checked.  Personal lifestyle choices, including:  Daily care of your teeth and gums.  Regular physical activity.  Eating a healthy diet.  Avoiding tobacco and drug use.  Limiting alcohol use.  Practicing safe sex.  Taking low-dose aspirin daily starting at age 62.  Taking vitamin and mineral supplements as recommended by your health care provider. What happens during an annual well check? The services and screenings done by your health care provider during your annual well check will depend on your age, overall health, lifestyle risk factors, and family history of disease. Counseling  Your health care provider may ask you questions about your:  Alcohol use.  Tobacco use.  Drug  use.  Emotional well-being.  Home and relationship well-being.  Sexual activity.  Eating habits.  Work and work Statistician.  Method of birth control.  Menstrual cycle.  Pregnancy history. Screening  You may have the following tests or measurements:  Height, weight, and BMI.  Blood pressure.  Lipid and cholesterol levels. These may be checked every 5 years, or more frequently if you are over 65 years old.  Skin check.  Lung cancer screening. You may have this screening every year starting at age 38 if you have a 30-pack-year history of smoking and currently smoke or have quit within the past 15 years.  Fecal occult blood test (FOBT) of the stool. You may have this test every year starting at age 54.  Flexible sigmoidoscopy or colonoscopy. You may have a sigmoidoscopy every 5 years or a colonoscopy every 10 years starting at age 64.  Hepatitis C blood test.  Hepatitis B blood test.  Sexually transmitted disease (STD) testing.  Diabetes screening. This is done by checking your blood sugar (glucose) after you have not eaten for a while (fasting). You may have this done every 1-3 years.  Mammogram. This may be done every 1-2 years. Talk to your health care provider about when you should start having regular mammograms. This may depend on whether you have a family history of breast cancer.  BRCA-related cancer screening. This may be done if you have a family history of breast, ovarian, tubal, or peritoneal cancers.  Pelvic exam and Pap test. This may be done every 3 years starting at age 54. Starting at age 71, this may be done every 5 years if you have a  Pap test in combination with an HPV test.  Bone density scan. This is done to screen for osteoporosis. You may have this scan if you are at high risk for osteoporosis. Discuss your test results, treatment options, and if necessary, the need for more tests with your health care provider. Vaccines  Your health care  provider may recommend certain vaccines, such as:  Influenza vaccine. This is recommended every year.  Tetanus, diphtheria, and acellular pertussis (Tdap, Td) vaccine. You may need a Td booster every 10 years.  Zoster vaccine. You may need this after age 6.  Pneumococcal 13-valent conjugate (PCV13) vaccine. You may need this if you have certain conditions and were not previously vaccinated.  Pneumococcal polysaccharide (PPSV23) vaccine. You may need one or two doses if you smoke cigarettes or if you have certain conditions. Talk to your health care provider about which screenings and vaccines you need and how often you need them. This information is not intended to replace advice given to you by your health care provider. Make sure you discuss any questions you have with your health care provider. Document Released: 07/15/2015 Document Revised: 03/07/2016 Document Reviewed: 04/19/2015 Elsevier Interactive Patient Education  2017 [place] Prevention in the Home Falls can cause injuries. They can happen to people of all ages. There are many things you can do to make your home safe and to help prevent falls. What can I do on the outside of my home?  Regularly fix the edges of walkways and driveways and fix any cracks.  Remove anything that might make you trip as you walk through a door, such as a raised step or threshold.  Trim any bushes or trees on the path to your home.  Use bright outdoor lighting.  Clear any walking paths of anything that might make someone trip, such as rocks or tools.  Regularly check to see if handrails are loose or broken. Make sure that both sides of any steps have handrails.  Any raised decks and porches should have guardrails on the edges.  Have any leaves, snow, or ice cleared regularly.  Use sand or salt on walking paths during winter.  Clean up any spills in your garage right away. This includes oil or grease spills. What can I do  in the bathroom?  Use night lights.  Install grab bars by the toilet and in the tub and shower. Do not use towel bars as grab bars.  Use non-skid mats or decals in the tub or shower.  If you need to sit down in the shower, use a plastic, non-slip stool.  Keep the floor dry. Clean up any water that spills on the floor as soon as it happens.  Remove soap buildup in the tub or shower regularly.  Attach bath mats securely with double-sided non-slip rug tape.  Do not have throw rugs and other things on the floor that can make you trip. What can I do in the bedroom?  Use night lights.  Make sure that you have a light by your bed that is easy to reach.  Do not use any sheets or blankets that are too big for your bed. They should not hang down onto the floor.  Have a firm chair that has side arms. You can use this for support while you get dressed.  Do not have throw rugs and other things on the floor that can make you trip. What can I do in the kitchen?  Clean  up any spills right away.  Avoid walking on wet floors.  Keep items that you use a lot in easy-to-reach places.  If you need to reach something above you, use a strong step stool that has a grab bar.  Keep electrical cords out of the way.  Do not use floor polish or wax that makes floors slippery. If you must use wax, use non-skid floor wax.  Do not have throw rugs and other things on the floor that can make you trip. What can I do with my stairs?  Do not leave any items on the stairs.  Make sure that there are handrails on both sides of the stairs and use them. Fix handrails that are broken or loose. Make sure that handrails are as long as the stairways.  Check any carpeting to make sure that it is firmly attached to the stairs. Fix any carpet that is loose or worn.  Avoid having throw rugs at the top or bottom of the stairs. If you do have throw rugs, attach them to the floor with carpet tape.  Make sure that you  have a light switch at the top of the stairs and the bottom of the stairs. If you do not have them, ask someone to add them for you. What else can I do to help prevent falls?  Wear shoes that:  Do not have high heels.  Have rubber bottoms.  Are comfortable and fit you well.  Are closed at the toe. Do not wear sandals.  If you use a stepladder:  Make sure that it is fully opened. Do not climb a closed stepladder.  Make sure that both sides of the stepladder are locked into place.  Ask someone to hold it for you, if possible.  Clearly mark and make sure that you can see:  Any grab bars or handrails.  First and last steps.  Where the edge of each step is.  Use tools that help you move around (mobility aids) if they are needed. These include:  Canes.  Walkers.  Scooters.  Crutches.  Turn on the lights when you go into a dark area. Replace any light bulbs as soon as they burn out.  Set up your furniture so you have a clear path. Avoid moving your furniture around.  If any of your floors are uneven, fix them.  If there are any pets around you, be aware of where they are.  Review your medicines with your doctor. Some medicines can make you feel dizzy. This can increase your chance of falling. Ask your doctor what other things that you can do to help prevent falls. This information is not intended to replace advice given to you by your health care provider. Make sure you discuss any questions you have with your health care provider. Document Released: 04/14/2009 Document Revised: 11/24/2015 Document Reviewed: 07/23/2014 Elsevier Interactive Patient Education  2017 [employer].

## 2017-01-17 NOTE — Progress Notes (Signed)
Subjective:   Shannon Garrett is a 56 y.o. female who presents for an Initial Medicare Annual Wellness Visit at [facility]       Objective:    Today's Vitals   01/17/17 1432  BP: 130/74  Temp: 97.7 F (36.5 C)  TempSrc: Oral  SpO2: 98%  Weight: 248 lb (112.5 kg)  Height: 5\' 4"  (1.626 m)   Body mass index is 42.57 kg/m.   Current Medications (verified) Outpatient Encounter Prescriptions as of 01/17/2017  Medication Sig  . acetaminophen (TYLENOL) 325 MG tablet Take 650 mg by mouth 3 (three) times daily.  Marland Kitchen albuterol (PROVENTIL) (2.5 MG/3ML) 0.083% nebulizer solution Take 3 mLs (2.5 mg total) by nebulization every 2 (two) hours as needed for wheezing.  Marland Kitchen amantadine (SYMMETREL) 100 MG capsule Take 100 mg by mouth daily.   . baclofen (LIORESAL) 20 MG tablet Take 1 tablet (20 mg total) by mouth 3 (three) times daily as needed for muscle spasms.  . carvedilol (COREG) 25 MG tablet Take 1 tablet (25 mg total) by mouth 2 (two) times daily with a meal.  . Cholecalciferol (VITAMIN D-3 PO) Take 1,000 Int'l Units/day by mouth at bedtime.   Marland Kitchen CRANBERRY PO Take 100 mg by mouth at bedtime.   . DULoxetine (CYMBALTA) 60 MG capsule Take 60 mg by mouth daily.  . famotidine (PEPCID) 20 MG tablet Take 1 tablet (20 mg total) by mouth 2 (two) times daily.  Marland Kitchen gabapentin (NEURONTIN) 100 MG capsule Take 7 capsules (700 mg total) by mouth 2 (two) times daily.  . hydrocortisone cream 0.5 % Apply to cheeks topically two times a day for dry areas, avoid eyes  . Lactase (LACTAID PO) Take 1 tablet by mouth daily.  . Lactulose 20 GM/30ML SOLN Take 30 mLs by mouth daily as needed.   . latanoprost (XALATAN) 0.005 % ophthalmic solution Place 1 drop into both eyes at bedtime.  . magnesium hydroxide (MILK OF MAGNESIA) 400 MG/5ML suspension Take 30 mLs by mouth every 8 (eight) hours as needed for mild constipation or moderate constipation.  . Menthol, Topical Analgesic, 154 MG PADS Icy Hot Patch: Apply  to left shoulder topically every 8 hours as needed for pain.  . mirabegron ER (MYRBETRIQ) 50 MG TB24 tablet Take 50 mg by mouth at bedtime.   . ondansetron (ZOFRAN) 4 MG tablet Take 1 tablet (4 mg total) by mouth every 6 (six) hours as needed for nausea.  . phenazopyridine (PYRIDIUM) 100 MG tablet Take 100 mg by mouth every 8 (eight) hours as needed for pain.  . polyethylene glycol (MIRALAX / GLYCOLAX) packet Take 17 g by mouth daily as needed for mild constipation.  . potassium chloride (KLOR-CON) 20 MEQ packet Take 20 mEq by mouth daily.   Marland Kitchen Propylene Glycol (SYSTANE BALANCE) 0.6 % SOLN Instill 1 drop in both eyes two times a day for dry eyes  . saccharomyces boulardii (FLORASTOR) 250 MG capsule Take 250 mg by mouth 2 (two) times daily.  Marland Kitchen senna-docusate (SENOKOT S) 8.6-50 MG tablet Take 1 tablet by mouth 2 (two) times daily.  . simethicone (MYLICON) 80 MG chewable tablet Chew 80 mg by mouth every 6 (six) hours as needed for flatulence.  Marland Kitchen spironolactone (ALDACTONE) 25 MG tablet Take 25 mg by mouth daily. For CHF  . tamsulosin (FLOMAX) 0.4 MG CAPS capsule Take 0.4 mg by mouth daily. For incontinence  . torsemide (DEMADEX) 20 MG tablet Take 20 mg by mouth daily.  Marland Kitchen zolpidem (AMBIEN  CR) 6.25 MG CR tablet Take 6.25 mg by mouth at bedtime.  . [DISCONTINUED] SIMETHICONE PO Take 40 mg by mouth every 6 (six) hours.   No facility-administered encounter medications on file as of 01/17/2017.     Allergies (verified) Sulfonamide derivatives and Penicillins   History: Past Medical History:  Diagnosis Date  . Cardiomyopathy ([place])    a.  Echo 04/29/12: Mild LVH, EF 20-25%, mild AI, moderate MR, moderate LAE, mild RAE, mild RVE, moderate TR, PASP 51, small pericardial effusion;   b. probably non-ischemic given multiple chemo-Tx agents used for MS and global LV dysfn on echo  . Chronic systolic heart failure ([place])   . Depression   . Glaucoma   . Hypertension   . MS (multiple sclerosis) ([place])    a.  Dx'd late 20's. b. Tx with Novantrone, Tysabri, Copaxone previously.   Past Surgical History:  Procedure Laterality Date  . ABLATION     uterine  . CESAREAN SECTION     Family History  Problem Relation Age of Onset  . Hypertension Mother   . Cancer Mother        breast   . Cancer Father        prostate  . Multiple sclerosis Sister   . Heart attack Neg Hx    Social History   Occupational History  . Not on file.   Social History Main Topics  . Smoking status: Former Smoker    Packs/day: 0.50    Years: 5.00  . Smokeless tobacco: Never Used  . Alcohol use No  . Drug use: No  . Sexual activity: Not Currently    Tobacco Counseling Counseling given: Not Answered   Activities of Daily Living In your present state of health, do you have any difficulty performing the following activities: 01/17/2017  Hearing? N  Vision? Y  Difficulty concentrating or making decisions? N  Walking or climbing stairs? Y  Dressing or bathing? Y  Doing errands, shopping? Y  Preparing Food and eating ? Y  Using the Toilet? Y  In the past six months, have you accidently leaked urine? Y  Do you have problems with loss of bowel control? Y  Managing your Medications? Y  Managing your Finances? Y  Housekeeping or managing your Housekeeping? Y  Some recent data might be hidden    Immunizations and Health Maintenance Immunization History  Administered Date(s) Administered  . Influenza,inj,Quad PF,36+ Mos 08/01/2013  . Influenza-Unspecified 08/31/2014, 08/09/2015, 05/19/2016  . PPD Test 03/23/2016, 03/30/2016  . Pneumococcal Polysaccharide-23 08/01/2013  . Pneumococcal-Unspecified 08/30/2013, 07/29/2014, 08/31/2014   There are no preventive care reminders to display for this patient.  Patient Care Team: Gildardo Cranker, DO as PCP - General (Internal Medicine) Nyoka Cowden Phylis Bougie, NP as Nurse Practitioner (Nurse Practitioner) [facility] [place] ([place])  Indicate any  recent Medical Services you may have received from other than [facility] providers in the past year (date may be approximate).     Assessment:   This is a routine wellness examination for Shannon Garrett.   Hearing/Vision screen No exam data present  Dietary issues and exercise activities discussed: Current Exercise Habits: The patient does not participate in regular exercise at present, Exercise limited by: orthopedic condition(s)  Goals    None     Depression Screen PHQ 2/9 Scores 01/17/2017  PHQ - 2 Score 6  PHQ- 9 Score 16    Fall Risk Fall Risk  01/17/2017  Falls in the past year? Yes  Number falls  in past yr: 1  Injury with Fall? No    Cognitive Function:   Montreal Cognitive Assessment  09/27/2014  Visuospatial/ Executive (0/5) 2  Naming (0/3) 3  Attention: Read list of digits (0/2) 2  Attention: Read list of letters (0/1) 1  Attention: Serial 7 subtraction starting at 100 (0/3) 1  Language: Repeat phrase (0/2) 1  Language : Fluency (0/1) 0  Abstraction (0/2) 2  Delayed Recall (0/5) 0  Orientation (0/6) 6  Total 18  Adjusted Score (based on education) 18   6CIT Screen 01/17/2017  What Year? 0 points  What month? 0 points  What time? 0 points  Count back from 20 0 points  Months in reverse 0 points  Repeat phrase 2 points  Total Score 2    Screening Tests Health Maintenance  Topic Date Due  . INFLUENZA VACCINE  08/29/2017 (Originally 01/30/2017)  . PAP SMEAR  08/29/2017 (Originally 02/04/2000)  . COLONOSCOPY  08/29/2017 (Originally 11/11/2012)  . TETANUS/TDAP  10/31/2017 (Originally 11/11/1981)  . Hepatitis C Screening  10/31/2017 (Originally [birth year])  . MAMMOGRAM  09/21/2018  . HIV Screening  Completed      Plan:    I have personally reviewed and addressed the Medicare Annual Wellness questionnaire and have noted the following in the patient's chart:  A. Medical and social history B. Use of alcohol, tobacco or illicit drugs  C. Current medications and  supplements D. Functional ability and status E.  Nutritional status F.  Physical activity G. Advance directives H. List of other physicians I.  Hospitalizations, surgeries, and ER visits in previous 12 months J.  [place] to include hearing, vision, cognitive, depression L. Referrals and appointments - none  In addition, I have reviewed and discussed with patient certain preventive protocols, quality metrics, and best practice recommendations. A written personalized care plan for preventive services as well as general preventive health recommendations were provided to patient.  See attached scanned questionnaire for additional information.   Signed,   Rich Reining, RN Nurse Health Advisor   Quick Notes    Health Maintenance: TDAP and Hep C due     Abnormal Screen: 6 Cit-2     Patient Concerns: C/o hurt to touch waist down; confusion about sleep meds     Nurse Concerns: None

## 2017-01-24 ENCOUNTER — Encounter: Payer: Self-pay | Admitting: Internal Medicine

## 2017-01-24 DIAGNOSIS — R531 Weakness: Secondary | ICD-10-CM | POA: Diagnosis not present

## 2017-01-24 DIAGNOSIS — R1311 Dysphagia, oral phase: Secondary | ICD-10-CM | POA: Diagnosis not present

## 2017-01-24 DIAGNOSIS — G35 Multiple sclerosis: Secondary | ICD-10-CM | POA: Diagnosis not present

## 2017-01-24 NOTE — Progress Notes (Signed)
DATE:  January 24, 2017  Location:   [facility] Room Number: 230 B Place of Service: SNF (31)   Extended Emergency Contact Information Primary Emergency Contact: [place], [place] [address] [place] Phone: [phone] Relation: Sister Secondary Emergency Contact: Jefm Bryant,  [place] Phone: [phone] Mobile Phone: [phone] Relation: Friend  Advanced Directive information Does Patient Have a Medical Advance Directive?: Yes, Type of Advance Directive: Out of facility DNR (pink MOST or yellow form), Pre-existing out of facility DNR order (yellow form or pink MOST form): Yellow form placed in chart (order not valid for inpatient use);Pink MOST form placed in chart (order not valid for inpatient use), Does patient want to make changes to medical advance directive?: No - Patient declined  Chief Complaint  Patient presents with  . Medical Management of Chronic Issues    1 month follow up    HPI:    Past Medical History:  Diagnosis Date  . Cardiomyopathy ([place])    a.  Echo 04/29/12: Mild LVH, EF 20-25%, mild AI, moderate MR, moderate LAE, mild RAE, mild RVE, moderate TR, PASP 51, small pericardial effusion;   b. probably non-ischemic given multiple chemo-Tx agents used for MS and global LV dysfn on echo  . Chronic systolic heart failure ([place])   . Depression   . Glaucoma   . Hypertension   . MS (multiple sclerosis) ([place])    a. Dx'd late 20's. b. Tx with Novantrone, Tysabri, Copaxone previously.    Past Surgical History:  Procedure Laterality Date  . ABLATION     uterine  . CESAREAN SECTION      Patient Care Team: Gildardo Cranker, DO as PCP - General (Internal Medicine) Nyoka Cowden Phylis Bougie, NP as Nurse Practitioner (Nurse Practitioner) [facility]  Social History   Social History  . Marital status: Single    Spouse name: N/A  .  Number of children: N/A  . Years of education: N/A   Occupational History  . Not on file.   Social History Main Topics  . Smoking status: Former Smoker    Packs/day: 0.50    Years: 5.00  . Smokeless tobacco: Never Used  . Alcohol use No  . Drug use: No  . Sexual activity: Not Currently   Other Topics Concern  . Not on file   Social History Narrative  . No narrative on file     reports that she has quit smoking. She has a 2.50 pack-year smoking history. She has never used smokeless tobacco. She reports that she does not drink alcohol or use drugs.  Family History  Problem Relation Age of Onset  . Hypertension Mother   . Cancer Mother        breast   . Cancer Father        prostate  . Multiple sclerosis Sister   . Heart attack Neg Hx    Family Status  Relation Status  . Mother Alive  . Father Deceased  . Sister (Not Specified)  . Neg Hx (Not Specified)    Immunization History  Administered Date(s) Administered  . Influenza,inj,Quad PF,36+ Mos 08/01/2013  . Influenza-Unspecified 08/31/2014, 08/09/2015, 05/19/2016  . PPD Test 03/23/2016, 03/30/2016  . Pneumococcal Polysaccharide-23 08/01/2013  . Pneumococcal-Unspecified 08/30/2013, 07/29/2014, 08/31/2014    Allergies  Allergen Reactions  . Sulfonamide Derivatives Hives  and Shortness Of Breath  . Penicillins Hives and Itching    Has patient had a PCN reaction causing immediate rash, facial/tongue/throat swelling, SOB or lightheadedness with hypotension: unknown Has patient had a PCN reaction causing severe rash involving mucus membranes or skin necrosis: unknown Has patient had a PCN reaction that required hospitalization: unknown Has patient had a PCN reaction occurring within the last 10 years: unknown If all of the above answers are "NO", then may proceed with Cephalosporin use. Prior course of rocephin charted 05/2015    Medications: Patient's Medications  New Prescriptions   No medications on file    Previous Medications   ACETAMINOPHEN (TYLENOL) 325 MG TABLET    Take 650 mg by mouth 3 (three) times daily.   ALBUTEROL (PROVENTIL) (2.5 MG/3ML) 0.083% NEBULIZER SOLUTION    Take 3 mLs (2.5 mg total) by nebulization every 2 (two) hours as needed for wheezing.   AMANTADINE (SYMMETREL) 100 MG CAPSULE    Take 100 mg by mouth daily.    BACLOFEN (LIORESAL) 20 MG TABLET    Take 1 tablet (20 mg total) by mouth 3 (three) times daily as needed for muscle spasms.   CARVEDILOL (COREG) 25 MG TABLET    Take 1 tablet (25 mg total) by mouth 2 (two) times daily with a meal.   CHOLECALCIFEROL (VITAMIN D-3 PO)    Take 1,000 Int'l Units/day by mouth at bedtime.    CRANBERRY PO    Take 100 mg by mouth at bedtime.    DULOXETINE (CYMBALTA) 60 MG CAPSULE    Take 60 mg by mouth daily.   FAMOTIDINE (PEPCID) 20 MG TABLET    Take 1 tablet (20 mg total) by mouth 2 (two) times daily.   GABAPENTIN (NEURONTIN) 100 MG CAPSULE    Take 7 capsules (700 mg total) by mouth 2 (two) times daily.   HYDROCORTISONE CREAM 0.5 %    Apply to cheeks topically two times a day for dry areas, avoid eyes   LACTASE (LACTAID PO)    Take 1 tablet by mouth daily.   LACTULOSE 20 GM/30ML SOLN    Take 30 mLs by mouth daily as needed.    LATANOPROST (XALATAN) 0.005 % OPHTHALMIC SOLUTION    Place 1 drop into both eyes at bedtime.   MAGNESIUM HYDROXIDE (MILK OF MAGNESIA) 400 MG/5ML SUSPENSION    Take 30 mLs by mouth every 8 (eight) hours as needed for mild constipation or moderate constipation.   MENTHOL, TOPICAL ANALGESIC, 154 MG PADS    Icy Hot Patch: Apply to left shoulder topically every 8 hours as needed for pain.   MIRABEGRON ER (MYRBETRIQ) 50 MG TB24 TABLET    Take 50 mg by mouth at bedtime.    ONDANSETRON (ZOFRAN) 4 MG TABLET    Take 1 tablet (4 mg total) by mouth every 6 (six) hours as needed for nausea.   PHENAZOPYRIDINE (PYRIDIUM) 100 MG TABLET    Take 100 mg by mouth every 8 (eight) hours as needed for pain.   POLYETHYLENE GLYCOL (MIRALAX /  GLYCOLAX) PACKET    Take 17 g by mouth daily as needed for mild constipation.   POTASSIUM CHLORIDE (KLOR-CON) 20 MEQ PACKET    Take 20 mEq by mouth daily.    PROPYLENE GLYCOL (SYSTANE BALANCE) 0.6 % SOLN    Instill 1 drop in both eyes two times a day for dry eyes   SENNA-DOCUSATE (SENOKOT S) 8.6-50 MG TABLET    Take 1 tablet by mouth 2 (two)  times daily.   SIMETHICONE (MYLICON) 80 MG CHEWABLE TABLET    Chew 80 mg by mouth every 6 (six) hours as needed for flatulence.   SPIRONOLACTONE (ALDACTONE) 25 MG TABLET    Take 25 mg by mouth daily. For CHF   TAMSULOSIN (FLOMAX) 0.4 MG CAPS CAPSULE    Take 0.4 mg by mouth daily. For incontinence   TORSEMIDE (DEMADEX) 20 MG TABLET    Take 20 mg by mouth daily.   ZOLPIDEM (AMBIEN CR) 6.25 MG CR TABLET    Take 6.25 mg by mouth at bedtime.  Modified Medications   No medications on file  Discontinued Medications   SACCHAROMYCES BOULARDII (FLORASTOR) 250 MG CAPSULE    Take 250 mg by mouth 2 (two) times daily.    Review of Systems  Vitals:   01/24/17 1147  BP: (!) 142/76  Pulse: 78  Resp: 19  Temp: 98.5 F (36.9 C)  SpO2: 95%  Weight: 238 lb 12.8 oz (108.3 kg)  Height: 5\' 4"  (1.626 m)   Body mass index is 40.99 kg/m.  Physical Exam   Labs reviewed: No visits with results within 3 Month(s) from this visit.  Latest known visit with results is:  Nursing Home on 10/12/2016  Component Date Value Ref Range Status  . Triglycerides 08/09/2016 109  40 - 160 mg/dL Final  . Cholesterol 08/09/2016 159  0 - 200 mg/dL Final  . HDL 08/09/2016 28* 35 - 70 mg/dL Final  . LDL Cholesterol 08/09/2016 109  mg/dL Final    No results found.   Assessment/Plan    Monica S. Perlie Gold  [facility] [place], [place] [address] [phone] Cell (Monday-Friday 8 AM - 5 PM) [phone] After 5 PM and follow prompts  This encounter was created in error - please disregard.

## 2017-01-25 DIAGNOSIS — R1311 Dysphagia, oral phase: Secondary | ICD-10-CM | POA: Diagnosis not present

## 2017-01-25 DIAGNOSIS — R531 Weakness: Secondary | ICD-10-CM | POA: Diagnosis not present

## 2017-01-25 DIAGNOSIS — G35 Multiple sclerosis: Secondary | ICD-10-CM | POA: Diagnosis not present

## 2017-01-28 ENCOUNTER — Non-Acute Institutional Stay (SKILLED_NURSING_FACILITY): Payer: Medicare Other | Admitting: Internal Medicine

## 2017-01-28 ENCOUNTER — Encounter: Payer: Self-pay | Admitting: Internal Medicine

## 2017-01-28 DIAGNOSIS — I10 Essential (primary) hypertension: Secondary | ICD-10-CM | POA: Diagnosis not present

## 2017-01-28 DIAGNOSIS — K5909 Other constipation: Secondary | ICD-10-CM

## 2017-01-28 DIAGNOSIS — G4700 Insomnia, unspecified: Secondary | ICD-10-CM

## 2017-01-28 DIAGNOSIS — G894 Chronic pain syndrome: Secondary | ICD-10-CM

## 2017-01-28 DIAGNOSIS — R531 Weakness: Secondary | ICD-10-CM | POA: Diagnosis not present

## 2017-01-28 DIAGNOSIS — F329 Major depressive disorder, single episode, unspecified: Secondary | ICD-10-CM | POA: Diagnosis not present

## 2017-01-28 DIAGNOSIS — G35 Multiple sclerosis: Secondary | ICD-10-CM

## 2017-01-28 DIAGNOSIS — I5042 Chronic combined systolic (congestive) and diastolic (congestive) heart failure: Secondary | ICD-10-CM

## 2017-01-28 DIAGNOSIS — N319 Neuromuscular dysfunction of bladder, unspecified: Secondary | ICD-10-CM | POA: Diagnosis not present

## 2017-01-28 DIAGNOSIS — F32A Depression, unspecified: Secondary | ICD-10-CM

## 2017-01-28 DIAGNOSIS — R1311 Dysphagia, oral phase: Secondary | ICD-10-CM | POA: Diagnosis not present

## 2017-01-28 NOTE — Progress Notes (Signed)
Patient ID: Shannon Garrett, female   DOB: 02-19-1961, 56 y.o.   MRN: [id number]    DATE:  01/28/2017 Location:    [place] Room Number: 230 B Place of Service: SNF (31)   Extended Emergency Contact Information Primary Emergency Contact: [place], [place] [address] [place] Phone: [phone] Relation: Sister Secondary Emergency Contact: Jefm Bryant, [place] Phone: [phone] Mobile Phone: [phone] Relation: Friend  Advanced Directive information Does Patient Have a Medical Advance Directive?: Yes, Type of Advance Directive: Out of facility DNR (pink MOST or yellow form), Pre-existing out of facility DNR order (yellow form or pink MOST form): Yellow form placed in chart (order not valid for inpatient use);Pink MOST form placed in chart (order not valid for inpatient use), Does patient want to make changes to medical advance directive?: No - Patient declined  Chief Complaint  Patient presents with  . Medical Management of Chronic Issues    Routine Visit    HPI:  56 yo female long term resident seen today for f/u. She states she is sleeping better. She would like gabapentin changed as her pain is generalized and not well controlled. She has fibromyalgia. She would like to see GYN as she has not had an annual female exam in a while  MS - slowly declining; she is paraplegic. Followed by  Neurology. Currently takes amantadine 100 mg daily; baclofen 20 mg tid prn for muscle spasms   Hypertension  - stable on coreg 25 mg twice daily  Chronic combined systolic and diastolic heart failure - EF 60-65%(01-12-15). Stable on demadex 20  mg daily with k+ 20 meq daily; aldactone 25 mg daily;  Coreg BID  Neurogenic bladder - stable on flomax 0.4 mg daily; myrbetriq 50 mg daily; she gets  I/O cath every 6 hours as needed   Depression/insomnia - mood stable on cymbalta 60 mg daily; ambien cr 6.25  mg nightly   Chronic pain syndrome/FMS - current regimen consists of neurontin 700 mg twice  daily; cymbalta 60 mg daily; tylenol 650 mg three times daily   Chronic Constipation - stable on miralax  daily as needed and senna s twice daily   GERD - stable on pepcid 20 mg twice daily   Glaucoma - stable on xalatan to both eyes; followed by eye specialist    Hx Addison's disease - stable  Past Medical History:  Diagnosis Date  . Cardiomyopathy ([place])    a.  Echo 04/29/12: Mild LVH, EF 20-25%, mild AI, moderate MR, moderate LAE, mild RAE, mild RVE, moderate TR, PASP 51, small pericardial effusion;   b. probably non-ischemic given multiple chemo-Tx agents used for MS and global LV dysfn on echo  . Chronic systolic heart failure ([place])   . Depression   . Glaucoma   . Hypertension   . MS (multiple sclerosis) ([place])    a. Dx'd late 20's. b. Tx with Novantrone, Tysabri, Copaxone previously.    Past Surgical History:  Procedure Laterality Date  . ABLATION     uterine  . CESAREAN SECTION      Patient Care Team: Gildardo Cranker, DO as PCP - General (Internal Medicine) Nyoka Cowden Phylis Bougie, NP as Nurse Practitioner (Nurse Practitioner) [facility] [place] ([place])  Social History   Social History  . Marital status: Single    Spouse name: N/A  .  Number of children: N/A  . Years of education: N/A   Occupational History  . Not on file.   Social History Main Topics  . Smoking status: Former Smoker    Packs/day: 0.50    Years: 5.00  . Smokeless tobacco: Never Used  . Alcohol use No  . Drug use: No  . Sexual activity: Not Currently   Other Topics Concern  . Not on file   Social History Narrative  . No narrative on file     reports that she has quit smoking. She has a 2.50 pack-year smoking history. She has never used smokeless tobacco. She reports that she does not drink alcohol or use drugs.  Family History  Problem Relation Age of Onset  .  Hypertension Mother   . Cancer Mother        breast   . Cancer Father        prostate  . Multiple sclerosis Sister   . Heart attack Neg Hx    Family Status  Relation Status  . Mother Alive  . Father Deceased  . Sister (Not Specified)  . Neg Hx (Not Specified)    Immunization History  Administered Date(s) Administered  . Influenza,inj,Quad PF,36+ Mos 08/01/2013  . Influenza-Unspecified 08/31/2014, 08/09/2015, 05/19/2016  . PPD Test 03/23/2016, 03/30/2016  . Pneumococcal Polysaccharide-23 08/01/2013  . Pneumococcal-Unspecified 08/30/2013, 07/29/2014, 08/31/2014    Allergies  Allergen Reactions  . Sulfonamide Derivatives Hives and Shortness Of Breath  . Penicillins Hives and Itching    Has patient had a PCN reaction causing immediate rash, facial/tongue/throat swelling, SOB or lightheadedness with hypotension: unknown Has patient had a PCN reaction causing severe rash involving mucus membranes or skin necrosis: unknown Has patient had a PCN reaction that required hospitalization: unknown Has patient had a PCN reaction occurring within the last 10 years: unknown If all of the above answers are "NO", then may proceed with Cephalosporin use. Prior course of rocephin charted 05/2015    Medications: Patient's Medications  New Prescriptions   No medications on file  Previous Medications   ACETAMINOPHEN (TYLENOL) 325 MG TABLET    Take 650 mg by mouth 3 (three) times daily.   ALBUTEROL (PROVENTIL) (2.5 MG/3ML) 0.083% NEBULIZER SOLUTION    Take 3 mLs (2.5 mg total) by nebulization every 2 (two) hours as needed for wheezing.   AMANTADINE (SYMMETREL) 100 MG CAPSULE    Take 100 mg by mouth daily.    BACLOFEN (LIORESAL) 20 MG TABLET    Take 1 tablet (20 mg total) by mouth 3 (three) times daily as needed for muscle spasms.   CARVEDILOL (COREG) 25 MG TABLET    Take 1 tablet (25 mg total) by mouth 2 (two) times daily with a meal.   CHOLECALCIFEROL (VITAMIN D-3 PO)    Take 1,000 Int'l  Units/day by mouth at bedtime.    CRANBERRY PO    Take 100 mg by mouth at bedtime.    DULOXETINE (CYMBALTA) 60 MG CAPSULE    Take 60 mg by mouth daily.   FAMOTIDINE (PEPCID) 20 MG TABLET    Take 1 tablet (20 mg total) by mouth 2 (two) times daily.   GABAPENTIN (NEURONTIN) 100 MG CAPSULE    Take 7 capsules (700 mg total) by mouth 2 (two) times daily.   HYDROCORTISONE CREAM 0.5 %    Apply to cheeks topically two times a day for dry areas, avoid eyes   LACTASE (LACTAID PO)    Take 1 tablet by mouth daily.  LACTULOSE 20 GM/30ML SOLN    Take 30 mLs by mouth daily as needed.    LATANOPROST (XALATAN) 0.005 % OPHTHALMIC SOLUTION    Place 1 drop into both eyes at bedtime.   MAGNESIUM HYDROXIDE (MILK OF MAGNESIA) 400 MG/5ML SUSPENSION    Take 30 mLs by mouth every 8 (eight) hours as needed for mild constipation or moderate constipation.   MENTHOL, TOPICAL ANALGESIC, 154 MG PADS    Icy Hot Patch: Apply to left shoulder topically every 8 hours as needed for pain.   MIRABEGRON ER (MYRBETRIQ) 50 MG TB24 TABLET    Take 50 mg by mouth at bedtime.    ONDANSETRON (ZOFRAN) 4 MG TABLET    Take 1 tablet (4 mg total) by mouth every 6 (six) hours as needed for nausea.   PHENAZOPYRIDINE (PYRIDIUM) 100 MG TABLET    Take 100 mg by mouth every 8 (eight) hours as needed for pain.   POLYETHYLENE GLYCOL (MIRALAX / GLYCOLAX) PACKET    Take 17 g by mouth daily as needed for mild constipation.   POTASSIUM CHLORIDE (KLOR-CON) 20 MEQ PACKET    Take 20 mEq by mouth daily.    PROPYLENE GLYCOL (SYSTANE BALANCE) 0.6 % SOLN    Instill 1 drop in both eyes two times a day for dry eyes   SENNA-DOCUSATE (SENOKOT S) 8.6-50 MG TABLET    Take 1 tablet by mouth 2 (two) times daily.   SIMETHICONE (MYLICON) 80 MG CHEWABLE TABLET    Chew 80 mg by mouth every 6 (six) hours as needed for flatulence.   SPIRONOLACTONE (ALDACTONE) 25 MG TABLET    Take 25 mg by mouth daily. For CHF   TAMSULOSIN (FLOMAX) 0.4 MG CAPS CAPSULE    Take 0.4 mg by mouth  daily. For incontinence   TORSEMIDE (DEMADEX) 20 MG TABLET    Take 20 mg by mouth daily.   ZOLPIDEM (AMBIEN CR) 6.25 MG CR TABLET    Take 6.25 mg by mouth at bedtime.  Modified Medications   No medications on file  Discontinued Medications   No medications on file    Review of Systems  Gastrointestinal: Positive for constipation.  Genitourinary: Positive for difficulty urinating.  Musculoskeletal: Positive for arthralgias and gait problem.  Psychiatric/Behavioral: Positive for sleep disturbance.  All other systems reviewed and are negative.   Vitals:   01/28/17 1252  BP: 132/82  Pulse: 84  Resp: 19  Temp: 98.4 F (36.9 C)  TempSrc: Oral  SpO2: 95%  Weight: 238 lb 12.8 oz (108.3 kg)  Height: 5\' 4"  (1.626 m)   Body mass index is 40.99 kg/m.  Physical Exam  Constitutional: She appears well-developed. No distress.  Frail appearing in NAD  HENT:  Mouth/Throat: Oropharynx is clear and moist. Mucous membranes are dry. No oropharyngeal exudate or posterior oropharyngeal erythema.  Eyes: Pupils are equal, round, and reactive to light. No scleral icterus.  Neck: Neck supple. Carotid bruit is not present. No tracheal deviation present. No thyromegaly present.  Cardiovascular: Normal rate, regular rhythm, normal heart sounds and intact distal pulses.  Exam reveals no gallop and no friction rub.   No murmur heard. Trace LE edema b/l. No calf TTP.   Pulmonary/Chest: Effort normal and breath sounds normal. No stridor. No respiratory distress. She has no wheezes. She has no rales.  Abdominal: Soft. Bowel sounds are normal. She exhibits distension. She exhibits no mass. There is no hepatomegaly. There is no tenderness. There is no rebound and no guarding.  Musculoskeletal:  She exhibits edema and tenderness.  Lymphadenopathy:    She has no cervical adenopathy.  Neurological: She is alert.  Paraplegic with b/l foot drop  Skin: Skin is warm and dry. No rash noted.  Psychiatric: Her  behavior is normal. Thought content normal. Her speech is slurred. She exhibits a depressed mood.     Labs reviewed: No visits with results within 3 Month(s) from this visit.  Latest known visit with results is:  Nursing Home on 10/12/2016  Component Date Value Ref Range Status  . Triglycerides 08/09/2016 109  40 - 160 mg/dL Final  . Cholesterol 08/09/2016 159  0 - 200 mg/dL Final  . HDL 08/09/2016 28* 35 - 70 mg/dL Final  . LDL Cholesterol 08/09/2016 109  mg/dL Final    No results found.   Assessment/Plan   ICD-10-CM   1. Chronic pain syndrome - uncontrolled G89.4   2. MULTIPLE SCLEROSIS, PROGRESSIVE/RELAPSING G35   3. Neurogenic bladder N31.9   4. Chronic combined systolic and diastolic congestive heart failure (HCC) I50.42   5. Insomnia, unspecified type - improved G47.00   6. Depression, unspecified depression type F32.9   7. Chronic constipation K59.09   8. Essential hypertension I10      Add gabapentin dose 300mg  qHS. Continue 700mg  BID  Refer to GYN for annual exam  Check CMP, CBC w diff  Cont other meds as ordered  PT/OT as indicated  F/u with neurology as scheduled  F/u with eye specialist as scheduled  Will follow   Debhora Titus S. Perlie Gold  [facility] [address] [place], [place] [address] [phone] Cell (Monday-Friday 8 AM - 5 PM) [phone] After 5 PM and follow prompts

## 2017-01-29 DIAGNOSIS — D649 Anemia, unspecified: Secondary | ICD-10-CM | POA: Diagnosis not present

## 2017-01-29 DIAGNOSIS — R531 Weakness: Secondary | ICD-10-CM | POA: Diagnosis not present

## 2017-01-29 DIAGNOSIS — Z79899 Other long term (current) drug therapy: Secondary | ICD-10-CM | POA: Diagnosis not present

## 2017-01-29 DIAGNOSIS — G35 Multiple sclerosis: Secondary | ICD-10-CM | POA: Diagnosis not present

## 2017-01-29 DIAGNOSIS — R1311 Dysphagia, oral phase: Secondary | ICD-10-CM | POA: Diagnosis not present

## 2017-01-29 LAB — CBC AND DIFFERENTIAL
HCT: 32 — AB (ref 36–46)
Hemoglobin: 10.8 — AB (ref 12.0–16.0)
Neutrophils Absolute: 3
Platelets: 262 (ref 150–399)
WBC: 5.1

## 2017-01-29 LAB — BASIC METABOLIC PANEL
BUN: 17 (ref 4–21)
CREATININE: 1.3 — AB (ref 0.5–1.1)
GLUCOSE: 128
POTASSIUM: 4.2 (ref 3.4–5.3)
Sodium: 144 (ref 137–147)

## 2017-01-29 LAB — HEPATIC FUNCTION PANEL
ALT: 10 (ref 7–35)
AST: 14 (ref 13–35)
Alkaline Phosphatase: 138 — AB (ref 25–125)
Bilirubin, Total: 0.2

## 2017-01-30 DIAGNOSIS — G35 Multiple sclerosis: Secondary | ICD-10-CM | POA: Diagnosis not present

## 2017-01-30 DIAGNOSIS — R1311 Dysphagia, oral phase: Secondary | ICD-10-CM | POA: Diagnosis not present

## 2017-01-30 DIAGNOSIS — R531 Weakness: Secondary | ICD-10-CM | POA: Diagnosis not present

## 2017-01-31 ENCOUNTER — Other Ambulatory Visit: Payer: Self-pay

## 2017-01-31 DIAGNOSIS — G35 Multiple sclerosis: Secondary | ICD-10-CM | POA: Diagnosis not present

## 2017-01-31 DIAGNOSIS — R1311 Dysphagia, oral phase: Secondary | ICD-10-CM | POA: Diagnosis not present

## 2017-01-31 DIAGNOSIS — R531 Weakness: Secondary | ICD-10-CM | POA: Diagnosis not present

## 2017-01-31 MED ORDER — ZOLPIDEM TARTRATE ER 6.25 MG PO TBCR: 6.2500 mg | EXTENDED_RELEASE_TABLET | Freq: Every day | ORAL | 0 refills | Status: DC

## 2017-01-31 NOTE — Telephone Encounter (Signed)
RX faxed to [facility] @ [phone], phone number [phone] 

## 2017-02-01 DIAGNOSIS — R1311 Dysphagia, oral phase: Secondary | ICD-10-CM | POA: Diagnosis not present

## 2017-02-01 DIAGNOSIS — R531 Weakness: Secondary | ICD-10-CM | POA: Diagnosis not present

## 2017-02-01 DIAGNOSIS — G35 Multiple sclerosis: Secondary | ICD-10-CM | POA: Diagnosis not present

## 2017-02-04 DIAGNOSIS — R1311 Dysphagia, oral phase: Secondary | ICD-10-CM | POA: Diagnosis not present

## 2017-02-04 DIAGNOSIS — G35 Multiple sclerosis: Secondary | ICD-10-CM | POA: Diagnosis not present

## 2017-02-04 DIAGNOSIS — R531 Weakness: Secondary | ICD-10-CM | POA: Diagnosis not present

## 2017-02-05 DIAGNOSIS — G35 Multiple sclerosis: Secondary | ICD-10-CM | POA: Diagnosis not present

## 2017-02-05 DIAGNOSIS — R1311 Dysphagia, oral phase: Secondary | ICD-10-CM | POA: Diagnosis not present

## 2017-02-05 DIAGNOSIS — R531 Weakness: Secondary | ICD-10-CM | POA: Diagnosis not present

## 2017-02-06 DIAGNOSIS — R1311 Dysphagia, oral phase: Secondary | ICD-10-CM | POA: Diagnosis not present

## 2017-02-06 DIAGNOSIS — G35 Multiple sclerosis: Secondary | ICD-10-CM | POA: Diagnosis not present

## 2017-02-06 DIAGNOSIS — R531 Weakness: Secondary | ICD-10-CM | POA: Diagnosis not present

## 2017-02-07 DIAGNOSIS — R531 Weakness: Secondary | ICD-10-CM | POA: Diagnosis not present

## 2017-02-07 DIAGNOSIS — R1311 Dysphagia, oral phase: Secondary | ICD-10-CM | POA: Diagnosis not present

## 2017-02-07 DIAGNOSIS — G35 Multiple sclerosis: Secondary | ICD-10-CM | POA: Diagnosis not present

## 2017-02-08 DIAGNOSIS — R531 Weakness: Secondary | ICD-10-CM | POA: Diagnosis not present

## 2017-02-08 DIAGNOSIS — R1311 Dysphagia, oral phase: Secondary | ICD-10-CM | POA: Diagnosis not present

## 2017-02-08 DIAGNOSIS — G35 Multiple sclerosis: Secondary | ICD-10-CM | POA: Diagnosis not present

## 2017-02-11 ENCOUNTER — Encounter: Payer: Self-pay | Admitting: Internal Medicine

## 2017-02-11 ENCOUNTER — Non-Acute Institutional Stay (SKILLED_NURSING_FACILITY): Payer: Medicare Other | Admitting: Internal Medicine

## 2017-02-11 DIAGNOSIS — G35 Multiple sclerosis: Secondary | ICD-10-CM | POA: Diagnosis not present

## 2017-02-11 DIAGNOSIS — G629 Polyneuropathy, unspecified: Secondary | ICD-10-CM

## 2017-02-11 DIAGNOSIS — R531 Weakness: Secondary | ICD-10-CM | POA: Diagnosis not present

## 2017-02-11 DIAGNOSIS — G8220 Paraplegia, unspecified: Secondary | ICD-10-CM | POA: Diagnosis not present

## 2017-02-11 DIAGNOSIS — R1311 Dysphagia, oral phase: Secondary | ICD-10-CM | POA: Diagnosis not present

## 2017-02-11 DIAGNOSIS — Z1211 Encounter for screening for malignant neoplasm of colon: Secondary | ICD-10-CM

## 2017-02-11 NOTE — Progress Notes (Signed)
Patient ID: Shannon Garrett, female   DOB: May 23, 1961, 56 y.o.   MRN: [id number]      DATE:  February 11, 2017  Location:   [facility] Room Number: 230 B Place of Service: SNF (31)   Extended Emergency Contact Information Primary Emergency Contact: Dat[place], [place] [address] [place] Phone: [phone] Relation: Sister Secondary Emergency Contact: Jefm Bryant, [place] Phone: [phone] Mobile Phone: [phone] Relation: Friend  Advanced Directive information Does Patient Have a Medical Advance Directive?: Yes, Type of Advance Directive: Out of facility DNR (pink MOST or yellow form), Pre-existing out of facility DNR order (yellow form or pink MOST form): Yellow form placed in chart (order not valid for inpatient use);Pink MOST form placed in chart (order not valid for inpatient use), Does patient want to make changes to medical advance directive?: No - Patient declined  Chief Complaint  Patient presents with  . Follow-up    Exam for Diabetic shoes    HPI:  56 yo female long term resident seen today for foot exam to obtain shoes. 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 She has shoes already. She is NOT DIABETIC. She is a poor historian due to memory loss/psych. Hx obtained from chart. She still has not had a colonoscopy yet and is concerned due to increased gas/bloating.no BRBPR/melena.  Past Medical History:  Diagnosis Date  . Cardiomyopathy ([place])    a.  Echo 04/29/12: Mild LVH, EF 20-25%, mild AI, moderate MR, moderate LAE, mild RAE, mild RVE, moderate TR, PASP 51, small pericardial effusion;   b. probably non-ischemic given multiple chemo-Tx agents used for MS and global LV dysfn on echo  . Chronic systolic heart failure ([place])   . Depression   . Glaucoma   . Hypertension   . MS (multiple sclerosis) ([place])    a. Dx'd late 20's. b. Tx with Novantrone,  Tysabri, Copaxone previously.    Past Surgical History:  Procedure Laterality Date  . ABLATION     uterine  . CESAREAN SECTION      Patient Care Team: Gildardo Cranker, DO as PCP - General (Internal Medicine) Nyoka Cowden Phylis Bougie, NP as Nurse Practitioner (Nurse Practitioner) [facility] [place] ([place])  Social History   Social History  . Marital status: Single    Spouse name: N/A  . Number of children: N/A  . Years of education: N/A   Occupational History  . Not on file.   Social History Main Topics  . Smoking status: Former Smoker    Packs/day: 0.50    Years: 5.00  . Smokeless tobacco: Never Used  . Alcohol use No  . Drug use: No  . Sexual activity: Not Currently   Other Topics Concern  . Not on file   Social History Narrative  . No narrative on file     reports that she has quit smoking. She has a 2.50 pack-year smoking history. She has never used smokeless tobacco. She reports that she does not drink alcohol or use drugs.  Family History  Problem Relation Age of Onset  . Hypertension Mother   . Cancer Mother        breast   . Cancer Father        prostate  . Multiple sclerosis Sister   . Heart attack Neg Hx  Family Status  Relation Status  . Mother Alive  . Father Deceased  . Sister (Not Specified)  . Neg Hx (Not Specified)    Immunization History  Administered Date(s) Administered  . Influenza,inj,Quad PF,36+ Mos 08/01/2013  . Influenza-Unspecified 08/31/2014, 08/09/2015, 05/19/2016  . PPD Test 03/23/2016, 03/30/2016  . Pneumococcal Polysaccharide-23 08/01/2013  . Pneumococcal-Unspecified 08/30/2013, 07/29/2014, 08/31/2014    Allergies  Allergen Reactions  . Sulfonamide Derivatives Hives and Shortness Of Breath  . Penicillins Hives and Itching    Has patient had a PCN reaction causing immediate rash, facial/tongue/throat swelling, SOB or lightheadedness with hypotension: unknown Has patient had a PCN reaction  causing severe rash involving mucus membranes or skin necrosis: unknown Has patient had a PCN reaction that required hospitalization: unknown Has patient had a PCN reaction occurring within the last 10 years: unknown If all of the above answers are "NO", then may proceed with Cephalosporin use. Prior course of rocephin charted 05/2015    Medications: Patient's Medications  New Prescriptions   No medications on file  Previous Medications   ACETAMINOPHEN (TYLENOL) 325 MG TABLET    Take 650 mg by mouth 3 (three) times daily.   ALBUTEROL (PROVENTIL) (2.5 MG/3ML) 0.083% NEBULIZER SOLUTION    Take 3 mLs (2.5 mg total) by nebulization every 2 (two) hours as needed for wheezing.   AMANTADINE (SYMMETREL) 100 MG CAPSULE    Take 100 mg by mouth daily.    BACLOFEN (LIORESAL) 20 MG TABLET    Take 1 tablet (20 mg total) by mouth 3 (three) times daily as needed for muscle spasms.   CARVEDILOL (COREG) 25 MG TABLET    Take 1 tablet (25 mg total) by mouth 2 (two) times daily with a meal.   CHOLECALCIFEROL (VITAMIN D-3 PO)    Take 1,000 Int'l Units/day by mouth at bedtime.    CRANBERRY PO    Take 100 mg by mouth at bedtime.    DULOXETINE (CYMBALTA) 60 MG CAPSULE    Take 60 mg by mouth daily.   FAMOTIDINE (PEPCID) 20 MG TABLET    Take 1 tablet (20 mg total) by mouth 2 (two) times daily.   GABAPENTIN (NEURONTIN) 100 MG CAPSULE    Give 7 tablets (700 mg) by mouth two times daily and 3 tablets (300 mg) by mouth at bedtime   HYDROCORTISONE CREAM 0.5 %    Apply to cheeks topically two times a day for dry areas, avoid eyes   LACTASE (LACTAID PO)    Take 1 tablet by mouth at bedtime.    LACTULOSE 20 GM/30ML SOLN    Take 30 mLs by mouth daily as needed.    LATANOPROST (XALATAN) 0.005 % OPHTHALMIC SOLUTION    Place 1 drop into both eyes at bedtime.   MAGNESIUM HYDROXIDE (MILK OF MAGNESIA) 400 MG/5ML SUSPENSION    Take 30 mLs by mouth every 8 (eight) hours as needed for mild constipation or moderate constipation.    MENTHOL, TOPICAL ANALGESIC, 154 MG PADS    Icy Hot Patch: Apply to left shoulder topically every 8 hours as needed for pain.   MIRABEGRON ER (MYRBETRIQ) 50 MG TB24 TABLET    Take 50 mg by mouth at bedtime.    ONDANSETRON (ZOFRAN) 4 MG TABLET    Take 1 tablet (4 mg total) by mouth every 6 (six) hours as needed for nausea.   PHENAZOPYRIDINE (PYRIDIUM) 100 MG TABLET    Take 100 mg by mouth every 8 (eight) hours as needed for pain.  POLYETHYLENE GLYCOL (MIRALAX / GLYCOLAX) PACKET    Take 17 g by mouth daily as needed for mild constipation.   POTASSIUM CHLORIDE (KLOR-CON) 20 MEQ PACKET    Take 20 mEq by mouth daily.    PROPYLENE GLYCOL (SYSTANE BALANCE) 0.6 % SOLN    Instill 1 drop in both eyes two times a day for dry eyes   SENNA-DOCUSATE (SENOKOT S) 8.6-50 MG TABLET    Take 1 tablet by mouth 2 (two) times daily.   SIMETHICONE (MYLICON) 80 MG CHEWABLE TABLET    Chew 80 mg by mouth every 6 (six) hours as needed for flatulence.   SPIRONOLACTONE (ALDACTONE) 25 MG TABLET    Take 25 mg by mouth daily. For CHF   TAMSULOSIN (FLOMAX) 0.4 MG CAPS CAPSULE    Take 0.4 mg by mouth daily. For incontinence   TORSEMIDE (DEMADEX) 20 MG TABLET    Take 20 mg by mouth daily.   ZOLPIDEM (AMBIEN CR) 6.25 MG CR TABLET    Take 1 tablet (6.25 mg total) by mouth at bedtime.  Modified Medications   No medications on file  Discontinued Medications   GABAPENTIN (NEURONTIN) 100 MG CAPSULE    Take 7 capsules (700 mg total) by mouth 2 (two) times daily.    Review of Systems  Unable to perform ROS: Other (memory loss/psych d/o)    Vitals:   02/11/17 1047  BP: 138/80  Pulse: 78  Resp: 19  Temp: 98.1 F (36.7 C)  SpO2: 95%  Weight: 242 lb 12.8 oz (110.1 kg)  Height: 5\' 5"  (1.651 m)   Body mass index is 40.4 kg/m.  Physical Exam  Constitutional: She appears well-developed and well-nourished.  Cardiovascular:  No LE edema b/l. No calf TTP. Intact DP/PT pulse b/l  Musculoskeletal: She exhibits edema.    Neurological: She is alert.  Skin: Skin is warm and dry. No rash noted.  Psychiatric: She has a normal mood and affect. Her behavior is normal. Thought content normal.   Diabetic Foot Exam - Simple   Simple Foot Form Diabetic Foot exam was performed with the following findings:  Yes 02/11/2017  2:11 PM  Visual Inspection See comments:  Yes Sensation Testing See comments:  Yes Pulse Check Posterior Tibialis and Dorsalis pulse intact bilaterally:  Yes Comments Right 5th toe anterior callus but no ulceration; strength 3/5 in foot b/l; reduced monofilament testing b/l       Labs reviewed: Abstract on 02/01/2017  Component Date Value Ref Range Status  . Hemoglobin 01/29/2017 10.8* 12.0 - 16.0 Final  . HCT 01/29/2017 32* 36 - 46 Final  . Neutrophils Absolute 01/29/2017 3   Final  . Platelets 01/29/2017 262  150 - 399 Final  . WBC 01/29/2017 5.1   Final  . Glucose 01/29/2017 128   Final  . BUN 01/29/2017 17  4 - 21 Final  . Creatinine 01/29/2017 1.3* 0.5 - 1.1 Final  . Potassium 01/29/2017 4.2  3.4 - 5.3 Final  . Sodium 01/29/2017 144  137 - 147 Final  . Alkaline Phosphatase 01/29/2017 138* 25 - 125 Final  . ALT 01/29/2017 10  7 - 35 Final  . AST 01/29/2017 14  13 - 35 Final  . Bilirubin, Total 01/29/2017 0.2   Final    No results found.   Assessment/Plan   ICD-10-CM   1. Neuropathy G62.9   2. MULTIPLE SCLEROSIS, PROGRESSIVE/RELAPSING G35   3. Paraplegia ([facility]) G82.20   4. Encounter for screening colonoscopy Z12.11  Form for shoes completed for her neuropathy 2/2 MS  Refer to GI for screening colonoscopy  Cont other meds as ordered  Will follow   Nazier Neyhart S. Perlie Gold  [facility] [address] [place], [place] [address] [phone] Cell (Monday-Friday 8 AM - 5 PM) [phone] After 5 PM and follow prompts

## 2017-02-12 DIAGNOSIS — G35 Multiple sclerosis: Secondary | ICD-10-CM | POA: Diagnosis not present

## 2017-02-12 DIAGNOSIS — R531 Weakness: Secondary | ICD-10-CM | POA: Diagnosis not present

## 2017-02-12 DIAGNOSIS — R1311 Dysphagia, oral phase: Secondary | ICD-10-CM | POA: Diagnosis not present

## 2017-02-13 DIAGNOSIS — G35 Multiple sclerosis: Secondary | ICD-10-CM | POA: Diagnosis not present

## 2017-02-13 DIAGNOSIS — R531 Weakness: Secondary | ICD-10-CM | POA: Diagnosis not present

## 2017-02-13 DIAGNOSIS — R1311 Dysphagia, oral phase: Secondary | ICD-10-CM | POA: Diagnosis not present

## 2017-02-14 DIAGNOSIS — R531 Weakness: Secondary | ICD-10-CM | POA: Diagnosis not present

## 2017-02-14 DIAGNOSIS — R1311 Dysphagia, oral phase: Secondary | ICD-10-CM | POA: Diagnosis not present

## 2017-02-14 DIAGNOSIS — G35 Multiple sclerosis: Secondary | ICD-10-CM | POA: Diagnosis not present

## 2017-02-15 DIAGNOSIS — R531 Weakness: Secondary | ICD-10-CM | POA: Diagnosis not present

## 2017-02-15 DIAGNOSIS — R1311 Dysphagia, oral phase: Secondary | ICD-10-CM | POA: Diagnosis not present

## 2017-02-15 DIAGNOSIS — G35 Multiple sclerosis: Secondary | ICD-10-CM | POA: Diagnosis not present

## 2017-02-18 ENCOUNTER — Other Ambulatory Visit: Payer: Self-pay

## 2017-02-18 DIAGNOSIS — G35 Multiple sclerosis: Secondary | ICD-10-CM | POA: Diagnosis not present

## 2017-02-18 DIAGNOSIS — R1311 Dysphagia, oral phase: Secondary | ICD-10-CM | POA: Diagnosis not present

## 2017-02-18 DIAGNOSIS — R531 Weakness: Secondary | ICD-10-CM | POA: Diagnosis not present

## 2017-02-18 MED ORDER — ZOLPIDEM TARTRATE ER 6.25 MG PO TBCR: 6.2500 mg | EXTENDED_RELEASE_TABLET | Freq: Every day | ORAL | 0 refills | Status: DC

## 2017-02-18 NOTE — Telephone Encounter (Signed)
RX faxed to [facility] @ [phone], phone number [phone] 

## 2017-02-19 DIAGNOSIS — G35 Multiple sclerosis: Secondary | ICD-10-CM | POA: Diagnosis not present

## 2017-02-19 DIAGNOSIS — R1311 Dysphagia, oral phase: Secondary | ICD-10-CM | POA: Diagnosis not present

## 2017-02-19 DIAGNOSIS — R531 Weakness: Secondary | ICD-10-CM | POA: Diagnosis not present

## 2017-02-20 DIAGNOSIS — R1311 Dysphagia, oral phase: Secondary | ICD-10-CM | POA: Diagnosis not present

## 2017-02-20 DIAGNOSIS — R531 Weakness: Secondary | ICD-10-CM | POA: Diagnosis not present

## 2017-02-20 DIAGNOSIS — G35 Multiple sclerosis: Secondary | ICD-10-CM | POA: Diagnosis not present

## 2017-02-21 DIAGNOSIS — R1311 Dysphagia, oral phase: Secondary | ICD-10-CM | POA: Diagnosis not present

## 2017-02-21 DIAGNOSIS — G35 Multiple sclerosis: Secondary | ICD-10-CM | POA: Diagnosis not present

## 2017-02-21 DIAGNOSIS — R531 Weakness: Secondary | ICD-10-CM | POA: Diagnosis not present

## 2017-02-26 ENCOUNTER — Encounter: Payer: Self-pay | Admitting: Adult Health

## 2017-02-26 ENCOUNTER — Non-Acute Institutional Stay (SKILLED_NURSING_FACILITY): Payer: Medicare Other | Admitting: Adult Health

## 2017-02-26 DIAGNOSIS — G894 Chronic pain syndrome: Secondary | ICD-10-CM | POA: Diagnosis not present

## 2017-02-26 DIAGNOSIS — G35 Multiple sclerosis: Secondary | ICD-10-CM

## 2017-02-26 DIAGNOSIS — K5909 Other constipation: Secondary | ICD-10-CM

## 2017-02-26 DIAGNOSIS — I5042 Chronic combined systolic (congestive) and diastolic (congestive) heart failure: Secondary | ICD-10-CM | POA: Diagnosis not present

## 2017-02-26 NOTE — Progress Notes (Signed)
Location:   [facility] Room Number: [place] of Service:  SNF (31)   CODE STATUS: DNR  Allergies  Allergen Reactions  . Sulfonamide Derivatives Hives and Shortness Of Breath  . Penicillins Hives and Itching    Has patient had a PCN reaction causing immediate rash, facial/tongue/throat swelling, SOB or lightheadedness with hypotension: unknown Has patient had a PCN reaction causing severe rash involving mucus membranes or skin necrosis: unknown Has patient had a PCN reaction that required hospitalization: unknown Has patient had a PCN reaction occurring within the last 10 years: unknown If all of the above answers are "NO", then may proceed with Cephalosporin use. Prior course of rocephin charted 05/2015    Chief Complaint  Patient presents with  . Medical Management of Chronic Issues    MS; chornic pain; chf; constipation     HPI:  She is a 56 year old being seen for the management of her chronic illnesses: MS; chronic pain; chf; constipation. She has no complaints of pain; shortness of breath, cough. No reports of fever present. She does complain of constipation. She does get out of bed daily. There are no nursing concerns at this time.   Past Medical History:  Diagnosis Date  . Cardiomyopathy ([place])    a.  Echo 04/29/12: Mild LVH, EF 20-25%, mild AI, moderate MR, moderate LAE, mild RAE, mild RVE, moderate TR, PASP 51, small pericardial effusion;   b. probably non-ischemic given multiple chemo-Tx agents used for MS and global LV dysfn on echo  . Chronic systolic heart failure ([place])   . Depression   . Glaucoma   . Hypertension   . MS (multiple sclerosis) ([facility])    a. Dx'd late 20's. b. Tx with Novantrone, Tysabri, Copaxone previously.    Past Surgical History:  Procedure Laterality Date  . ABLATION     uterine  . CESAREAN SECTION      Social History   Social History  . Marital status: Single    Spouse name: N/A  . Number of children: N/A  . Years  of education: N/A   Occupational History  . Not on file.   Social History Main Topics  . Smoking status: Former Smoker    Packs/day: 0.50    Years: 5.00  . Smokeless tobacco: Never Used  . Alcohol use No  . Drug use: No  . Sexual activity: Not Currently   Other Topics Concern  . Not on file   Social History Narrative  . No narrative on file   Family History  Problem Relation Age of Onset  . Hypertension Mother   . Cancer Mother        breast   . Cancer Father        prostate  . Multiple sclerosis Sister   . Heart attack Neg Hx       VITAL SIGNS BP 136/76   Pulse 74   Temp 98.7 F (37.1 C)   Resp 18   Ht '5\' 5"'$  (1.651 m)   Wt 242 lb 12.8 oz (110.1 kg)   BMI 40.40 kg/m   Patient's Medications  New Prescriptions   No medications on file  Previous Medications   ACETAMINOPHEN (TYLENOL) 325 MG TABLET    Take 650 mg by mouth 3 (three) times daily.   ALBUTEROL (PROVENTIL) (2.5 MG/3ML) 0.083% NEBULIZER SOLUTION    Take 3 mLs (2.5 mg total) by nebulization every 2 (two) hours as needed for wheezing.   AMANTADINE (SYMMETREL) 100  MG CAPSULE    Take 100 mg by mouth daily.    BACLOFEN (LIORESAL) 20 MG TABLET    Take 1 tablet (20 mg total) by mouth 3 (three) times daily as needed for muscle spasms.   CARVEDILOL (COREG) 25 MG TABLET    Take 1 tablet (25 mg total) by mouth 2 (two) times daily with a meal.   CHOLECALCIFEROL (VITAMIN D-3 PO)    Take 1,000 Int'l Units/day by mouth at bedtime.    CRANBERRY PO    Take 100 mg by mouth at bedtime.    DULOXETINE (CYMBALTA) 60 MG CAPSULE    Take 60 mg by mouth daily.   FAMOTIDINE (PEPCID) 20 MG TABLET    Take 1 tablet (20 mg total) by mouth 2 (two) times daily.   GABAPENTIN (NEURONTIN) 100 MG CAPSULE    Give 7 tablets (700 mg) by mouth two times daily and 3 tablets (300 mg) by mouth at bedtime   HYDROCORTISONE CREAM 0.5 %    Apply to cheeks topically two times a day for dry areas, avoid eyes   LACTASE (LACTAID PO)    Take 1 tablet by  mouth at bedtime.    LACTULOSE 20 GM/30ML SOLN    Take 30 mLs by mouth daily as needed.    LATANOPROST (XALATAN) 0.005 % OPHTHALMIC SOLUTION    Place 1 drop into both eyes at bedtime.   MAGNESIUM HYDROXIDE (MILK OF MAGNESIA) 400 MG/5ML SUSPENSION    Take 30 mLs by mouth every 8 (eight) hours as needed for mild constipation or moderate constipation.   MENTHOL, TOPICAL ANALGESIC, 154 MG PADS    Icy Hot Patch: Apply to left shoulder topically every 8 hours as needed for pain.   MIRABEGRON ER (MYRBETRIQ) 50 MG TB24 TABLET    Take 50 mg by mouth at bedtime.    ONDANSETRON (ZOFRAN) 4 MG TABLET    Take 1 tablet (4 mg total) by mouth every 6 (six) hours as needed for nausea.   PHENAZOPYRIDINE (PYRIDIUM) 100 MG TABLET    Take 100 mg by mouth every 8 (eight) hours as needed for pain.   POLYETHYLENE GLYCOL (MIRALAX / GLYCOLAX) PACKET    Take 17 g by mouth daily as needed for mild constipation.   POTASSIUM CHLORIDE (KLOR-CON) 20 MEQ PACKET    Take 20 mEq by mouth daily.    PROPYLENE GLYCOL (SYSTANE BALANCE) 0.6 % SOLN    Instill 1 drop in both eyes two times a day for dry eyes   SENNA-DOCUSATE (SENOKOT S) 8.6-50 MG TABLET    Take 1 tablet by mouth 2 (two) times daily.   SIMETHICONE (MYLICON) 80 MG CHEWABLE TABLET    Chew 80 mg by mouth every 6 (six) hours as needed for flatulence.   SPIRONOLACTONE (ALDACTONE) 25 MG TABLET    Take 25 mg by mouth daily. For CHF   TAMSULOSIN (FLOMAX) 0.4 MG CAPS CAPSULE    Take 0.4 mg by mouth daily. For incontinence   TORSEMIDE (DEMADEX) 20 MG TABLET    Take 20 mg by mouth daily.   ZOLPIDEM (AMBIEN CR) 6.25 MG CR TABLET    Take 1 tablet (6.25 mg total) by mouth at bedtime.  Modified Medications   No medications on file  Discontinued Medications   No medications on file     SIGNIFICANT DIAGNOSTIC EXAMS  10-08-14: MRI brain: No significant change in diffuse white matter lesions consistent with patient's history of multiple sclerosis. None of these areas demonstrate abnormal  restriction or enhancement as may be seen with areas of acute demyelination. Global atrophy. 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 Right parotid 1 cm lesion does not have typical appearance of an intra parotid lymph node and therefore primary parotid lesion is a possibility. ENT consultation may be considered for further delineation.  10-08-14: mri of cervical; spine: Scattered new areas of altered signal intensity (compared to 2007) throughout the cervical cord as detailed above consistent with patient's history of multiple sclerosis. None of these areas demonstrate enhancement as may be seen with areas of active demyelination. Cervical spondylotic changes as detailed above most prominent C5-6 level.  12-13-14: ABI: right: mild atherosclerotic disease   01-10-15: ct of head: No acute intracranial abnormalities. Mild atrophy. White matter disease compatible with history of MS.  01-10-15: chest x-ray: No active disease  01-12-15: 2-d echo: Left ventricle: The cavity size was normal. There was mild concentric hypertrophy. Systolic function was normal. The estimated ejection fraction was in the range of 60% to 65%. Wall motion was normal; there were no regional wall motion abnormalities. Doppler parameters are consistent with abnormal left ventricular relaxation (grade 1 diastolic dysfunction). - Aortic valve: There was mild regurgitation. - Mitral valve: Calcified annulus. There was mild regurgitation.  05-30-15: chest x-ray: No active disease.  07-14-15: EEG: This EEG is consistent with a mild generalized non-specific cerebral dysfunction(encephalopathy).   07-20-15: chest x-ray; Interval extubation. Decreasing lung volumes with increasing bibasilar opacities, likely atelectasis.  11-07-15: ct of head; No acute intracranial abnormality. White matter changes are suggestive for chronic small vessel ischemic disease. Right maxillary  sinus disease.   NO NEW EXAMS   LABS REVIEWED: PREVIOUS    03-03-16: wbc 5.3; hgb 10.0; hct 32.6; mcv 81.9; plt 242; glucose 149; bun 15.2; creat 1.26; k+ 3.4; na++ 143; ast 140; albumin 3.9; vit B 12: >2000 03-23-16: glucose 94; bun 18.5; creat 1.42; k+ 4.1; na++ 142  08-09-16: chol 159; ldl 109; trig 109; hdl 28  09-10-16: wbc 4.9; hgb 10.5; hct 33.2; mcv 86.6; plt 253  glucose 164; bun 9.0; creat 1.12; k+ 3.8; na++ 14; liver normal albumin 3.8 chol 200; ldl 142; trig 144; hdl 29  11-20-16: urine culture: providencia stuartii: fosfomycin 3 gm 12-12-16: urine culture: providencia stuartii: rocephin   TODAY:  01-29-17: wbc 5.1; hgb 10.8; hct 32.1; mcv 85.2; plt 262; glucose 128; bun 17.1; creat 1.25; k+ 4.2; na++ 144; alk phos 138; albumin 3.9     Review of Systems  Constitutional: Negative for malaise/fatigue.  Respiratory: Negative for cough and shortness of breath.   Cardiovascular: Negative for chest pain, palpitations and leg swelling.  Gastrointestinal: Positive for constipation. Negative for abdominal pain and heartburn.  Musculoskeletal: Negative for back pain, joint pain and myalgias.       Has generalized aches and pains which are managed   Skin: Negative.   Neurological: Negative for dizziness.       Unable to move lower extremities   Psychiatric/Behavioral: The patient is not nervous/anxious.    Physical Exam  Constitutional: She is oriented to person, place, and time. No distress.  Obese   Eyes: Conjunctivae are normal.  Neck: Neck supple. No JVD present. No thyromegaly present.  Cardiovascular: Normal rate, regular rhythm and intact distal pulses.   Respiratory: Effort normal and breath sounds normal. No respiratory distress. She has no wheezes.  GI: Soft. Bowel sounds are normal. She exhibits  no distension. There is no tenderness.  Musculoskeletal: She exhibits no edema.  Limited movement of upper extremities  Unable to move lower extremities  Lymphadenopathy:    She  has no cervical adenopathy.  Neurological: She is alert and oriented to person, place, and time.  Skin: Skin is warm and dry. She is not diaphoretic.  Psychiatric: She has a normal mood and affect.    ASSESSMENT/ PLAN:  TODAY:   1. MS: is slowly declining   Has been  followed by  neurology;  Has paraplegia.will continue amantadine 100 mg daily baclofen 20 mg tid prn  for muscle spasms;   Saw neurology on 09-26-16   2. Chronic pain: is stable:   will continue  neurontin 700 mg twice  daily; takes cymbalta 60 mg daily   tylenol 650 mg three times daily routinely  3. Chronic combined systolic and diastolic heart failure:EF is 60-65%(01-12-15)    is stable will continue demadex 20  mg daily; with k+ 20 meq daily; will continue aldactone 25 mg daily;    4. Constipation:  Is worse; will continue senna s twice daily will begin sorbitol 30 cc daily and will monitor    PREVIOUS    5. Depression: is presently stable  taking cymbalta 60 mg daily; which also helps with her pain management.  ambien cr 6.25 mg nightly   6. Hypertension b/p136/76 : is stable  will continue  coreg 25 mg twice daily and will monitor   9. Neurogenic bladder: no change in status  takes flomax 0.4 mg daily  myrbetriq 50 mg daily   I/O cath every 6 hours as needed  10. Gerd: will continue pepcid 20 mg twice daily   11. Glaucoma: will continue xalatan to both eyes.    Will get cbc; cmp lipids; will setup for colonoscopy; female exam and mammogram   MD is aware of resident's narcotic use and is in agreement with current plan of care. We will attempt to wean resident as apropriate     Ok Edwards NP [facility]  Contact [phone] Monday through Friday 8am- 5pm  After hours call [phone]

## 2017-02-27 DIAGNOSIS — N390 Urinary tract infection, site not specified: Secondary | ICD-10-CM | POA: Diagnosis not present

## 2017-02-27 DIAGNOSIS — D649 Anemia, unspecified: Secondary | ICD-10-CM | POA: Diagnosis not present

## 2017-02-27 DIAGNOSIS — G35 Multiple sclerosis: Secondary | ICD-10-CM | POA: Diagnosis not present

## 2017-02-27 LAB — BASIC METABOLIC PANEL
BUN: 21 (ref 4–21)
Creatinine: 1.4 — AB (ref 0.5–1.1)
Glucose: 123
POTASSIUM: 4.8 (ref 3.4–5.3)
Sodium: 141 (ref 137–147)

## 2017-02-27 LAB — LIPID PANEL
Cholesterol: 201 — AB (ref 0–200)
HDL: 34 — AB (ref 35–70)
LDL Cholesterol: 132
Triglycerides: 129 (ref 40–160)

## 2017-02-27 LAB — HEPATIC FUNCTION PANEL
ALT: 28 (ref 7–35)
AST: 26 (ref 13–35)
Alkaline Phosphatase: 137 — AB (ref 25–125)
Bilirubin, Total: 0.2

## 2017-02-27 LAB — CBC AND DIFFERENTIAL
HCT: 30 — AB (ref 36–46)
HEMOGLOBIN: 9.5 — AB (ref 12.0–16.0)
Neutrophils Absolute: 5
Platelets: 254 (ref 150–399)
WBC: 6.5

## 2017-03-01 ENCOUNTER — Encounter: Payer: Self-pay | Admitting: Adult Health

## 2017-03-01 ENCOUNTER — Non-Acute Institutional Stay (SKILLED_NURSING_FACILITY): Payer: Medicare Other | Admitting: Adult Health

## 2017-03-01 DIAGNOSIS — G894 Chronic pain syndrome: Secondary | ICD-10-CM

## 2017-03-01 DIAGNOSIS — B373 Candidiasis of vulva and vagina: Secondary | ICD-10-CM

## 2017-03-01 DIAGNOSIS — B3731 Acute candidiasis of vulva and vagina: Secondary | ICD-10-CM

## 2017-03-01 NOTE — Progress Notes (Signed)
Location:   [facility] Room Number: 076K Place of Service:  SNF (31)   CODE STATUS: full code   Allergies  Allergen Reactions  . Sulfonamide Derivatives Hives and Shortness Of Breath  . Penicillins Hives and Itching    Has patient had a PCN reaction causing immediate rash, facial/tongue/throat swelling, SOB or lightheadedness with hypotension: unknown Has patient had a PCN reaction causing severe rash involving mucus membranes or skin necrosis: unknown Has patient had a PCN reaction that required hospitalization: unknown Has patient had a PCN reaction occurring within the last 10 years: unknown If all of the above answers are "NO", then may proceed with Cephalosporin use. Prior course of rocephin charted 05/2015    Chief Complaint  Patient presents with  . Acute Visit    multiple concerns     HPI:  She declined I/O for her urine specimen. She tells me that she a yeast infection she has changes in body ordor and her skin burns when she urinates.she also is complaining of itching.  She is complaining of left shoulder pain. She would like to know if there is anything that can be done; without using narcotics. We did discuss treatment options and have decided on biofreeze.    Past Medical History:  Diagnosis Date  . Cardiomyopathy ([place])    a.  Echo 04/29/12: Mild LVH, EF 20-25%, mild AI, moderate MR, moderate LAE, mild RAE, mild RVE, moderate TR, PASP 51, small pericardial effusion;   b. probably non-ischemic given multiple chemo-Tx agents used for MS and global LV dysfn on echo  . Chronic systolic heart failure ([place])   . Depression   . Glaucoma   . Hypertension   . MS (multiple sclerosis) ([place])    a. Dx'd late 20's. b. Tx with Novantrone, Tysabri, Copaxone previously.    Past Surgical History:  Procedure Laterality Date  . ABLATION     uterine  . CESAREAN SECTION      Social History   Social History  . Marital status: Single    Spouse name: N/A  .  Number of children: N/A  . Years of education: N/A   Occupational History  . Not on file.   Social History Main Topics  . Smoking status: Former Smoker    Packs/day: 0.50    Years: 5.00  . Smokeless tobacco: Never Used  . Alcohol use No  . Drug use: No  . Sexual activity: Not Currently   Other Topics Concern  . Not on file   Social History Narrative  . No narrative on file   Family History  Problem Relation Age of Onset  . Hypertension Mother   . Cancer Mother        breast   . Cancer Father        prostate  . Multiple sclerosis Sister   . Heart attack Neg Hx       VITAL SIGNS BP 136/80   Pulse 74   Temp 97.8 F (36.6 C)   Resp (!) 61   Ht 5' 5" (1.651 m)   Wt 242 lb (109.8 kg)   BMI 40.27 kg/m    Patient's Medications  New Prescriptions   No medications on file  Previous Medications   ACETAMINOPHEN (TYLENOL) 325 MG TABLET    Take 650 mg by mouth 3 (three) times daily.   ALBUTEROL (PROVENTIL) (2.5 MG/3ML) 0.083% NEBULIZER SOLUTION    Take 3 mLs (2.5 mg total) by nebulization every 2 (two) hours  as needed for wheezing.   AMANTADINE (SYMMETREL) 100 MG CAPSULE    Take 100 mg by mouth daily.    BACLOFEN (LIORESAL) 20 MG TABLET    Take 1 tablet (20 mg total) by mouth 3 (three) times daily as needed for muscle spasms.   CARVEDILOL (COREG) 25 MG TABLET    Take 1 tablet (25 mg total) by mouth 2 (two) times daily with a meal.   CHOLECALCIFEROL (VITAMIN D-3 PO)    Take 1,000 Int'l Units/day by mouth at bedtime.    CRANBERRY PO    Take 100 mg by mouth at bedtime.    DULOXETINE (CYMBALTA) 60 MG CAPSULE    Take 60 mg by mouth daily.   FAMOTIDINE (PEPCID) 20 MG TABLET    Take 1 tablet (20 mg total) by mouth 2 (two) times daily.   GABAPENTIN (NEURONTIN) 100 MG CAPSULE    Give 7 tablets (700 mg) by mouth two times daily and 3 tablets (300 mg) by mouth at bedtime   HYDROCORTISONE CREAM 0.5 %    Apply to cheeks topically two times a day for dry areas, avoid eyes   LACTASE  (LACTAID PO)    Take 1 tablet by mouth at bedtime.    LACTULOSE 20 GM/30ML SOLN    Take 30 mLs by mouth daily as needed.    LATANOPROST (XALATAN) 0.005 % OPHTHALMIC SOLUTION    Place 1 drop into both eyes at bedtime.   MAGNESIUM HYDROXIDE (MILK OF MAGNESIA) 400 MG/5ML SUSPENSION    Take 30 mLs by mouth every 8 (eight) hours as needed for mild constipation or moderate constipation.   MENTHOL, TOPICAL ANALGESIC, 154 MG PADS    Icy Hot Patch: Apply to left shoulder topically every 8 hours as needed for pain.   MIRABEGRON ER (MYRBETRIQ) 50 MG TB24 TABLET    Take 50 mg by mouth at bedtime.    ONDANSETRON (ZOFRAN) 4 MG TABLET    Take 1 tablet (4 mg total) by mouth every 6 (six) hours as needed for nausea.   PHENAZOPYRIDINE (PYRIDIUM) 100 MG TABLET    Take 100 mg by mouth every 8 (eight) hours as needed for pain.   POLYETHYLENE GLYCOL (MIRALAX / GLYCOLAX) PACKET    Take 17 g by mouth daily as needed for mild constipation.   POTASSIUM CHLORIDE (KLOR-CON) 20 MEQ PACKET    Take 20 mEq by mouth daily.    PROPYLENE GLYCOL (SYSTANE BALANCE) 0.6 % SOLN    Instill 1 drop in both eyes two times a day for dry eyes   SENNA-DOCUSATE (SENOKOT S) 8.6-50 MG TABLET    Take 1 tablet by mouth 2 (two) times daily.   SIMETHICONE (MYLICON) 80 MG CHEWABLE TABLET    Chew 80 mg by mouth every 6 (six) hours as needed for flatulence.   SPIRONOLACTONE (ALDACTONE) 25 MG TABLET    Take 25 mg by mouth daily. For CHF   TAMSULOSIN (FLOMAX) 0.4 MG CAPS CAPSULE    Take 0.4 mg by mouth daily. For incontinence   TORSEMIDE (DEMADEX) 20 MG TABLET    Take 20 mg by mouth daily.   ZOLPIDEM (AMBIEN CR) 6.25 MG CR TABLET    Take 1 tablet (6.25 mg total) by mouth at bedtime.  Modified Medications   No medications on file  Discontinued Medications   No medications on file     SIGNIFICANT DIAGNOSTIC EXAMS  PREVIOUS   10-08-14: MRI brain: No significant change in diffuse white matter lesions consistent with  patient's history of multiple  sclerosis. None of these areas demonstrate abnormal restriction or enhancement as may be seen with areas of acute demyelination. Global atrophy. 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 Right parotid 1 cm lesion does not have typical appearance of an intra parotid lymph node and therefore primary parotid lesion is a possibility. ENT consultation may be considered for further delineation.  10-08-14: mri of cervical; spine: Scattered new areas of altered signal intensity (compared to 2007) throughout the cervical cord as detailed above consistent with patient's history of multiple sclerosis. None of these areas demonstrate enhancement as may be seen with areas of active demyelination. Cervical spondylotic changes as detailed above most prominent C5-6 level.  01-12-15: 2-d echo: Left ventricle: The cavity size was normal. There was mild concentric hypertrophy. Systolic function was normal. The estimated ejection fraction was in the range of 60% to 65%. Wall motion was normal; there were no regional wall motion abnormalities. Doppler parameters are consistent with abnormal left ventricular relaxation (grade 1 diastolic dysfunction). - Aortic valve: There was mild regurgitation. - Mitral valve: Calcified annulus. There was mild regurgitation.  07-14-15: EEG: This EEG is consistent with a mild generalized non-specific cerebral dysfunction(encephalopathy).   07-20-15: chest x-ray; Interval extubation. Decreasing lung volumes with increasing bibasilar opacities, likely atelectasis.  11-07-15: ct of head; No acute intracranial abnormality. White matter changes are suggestive for chronic small vessel ischemic disease. Right maxillary sinus disease.   NO NEW EXAMS   LABS REVIEWED: PREVIOUS    03-03-16: wbc 5.3; hgb 10.0; hct 32.6; mcv 81.9; plt 242; glucose 149; bun 15.2; creat 1.26; k+ 3.4; na++ 143; ast 140; albumin 3.9; vit B 12: >2000 03-23-16:  glucose 94; bun 18.5; creat 1.42; k+ 4.1; na++ 142  08-09-16: chol 159; ldl 109; trig 109; hdl 28  09-10-16: wbc 4.9; hgb 10.5; hct 33.2; mcv 86.6; plt 253  glucose 164; bun 9.0; creat 1.12; k+ 3.8; na++ 14; liver normal albumin 3.8 chol 200; ldl 142; trig 144; hdl 29  11-20-16: urine culture: providencia stuartii: fosfomycin 3 gm 12-12-16: urine culture: providencia stuartii: rocephin  01-29-17: wbc 5.1; hgb 10.8; hct 32.1; mcv 85.2; plt 262; glucose 128; bun 17.1; creat 1.25; k+ 4.2; na++ 144; alk phos 138; albumin 3.9  NO NEW LABS    Review of Systems  Constitutional: Negative for malaise/fatigue.  Respiratory: Negative for cough and shortness of breath.   Cardiovascular: Negative for chest pain, palpitations and leg swelling.  Gastrointestinal: Negative for abdominal pain, constipation and heartburn.  Genitourinary:       Has foul vaginal odor   Musculoskeletal: Positive for joint pain. Negative for back pain and myalgias.       Left shoulder pain    Skin: Negative.   Neurological: Negative for dizziness.       Unable to move lower extremities   Psychiatric/Behavioral: The patient is not nervous/anxious.    Physical Exam  Constitutional: She is oriented to person, place, and time. No distress.  Obese   Eyes: Conjunctivae are normal.  Neck: Neck supple. No JVD present. No thyromegaly present.  Cardiovascular: Normal rate, regular rhythm and intact distal pulses.   Respiratory: Effort normal and breath sounds normal. No respiratory distress. She has no wheezes.  GI: Soft. Bowel sounds are normal. She exhibits no distension. There is no tenderness.  Genitourinary: Vaginal discharge found.  Musculoskeletal: She exhibits no edema.  Limited movement  of upper extremities  Unable to move lower extremities Left shoulder pain with movement   Lymphadenopathy:    She has no cervical adenopathy.  Neurological: She is alert and oriented to person, place, and time.  Skin: Skin is warm and dry.  She is not diaphoretic.  Psychiatric: She has a normal mood and affect.    ASSESSMENT/ PLAN:  1.Chronic pain: is worse:   will continue  neurontin 700 mg twice  daily; takes cymbalta 60 mg daily   tylenol 650 mg three times daily routinely   Will begin biofreeze to left shoulder four times daily  2.  Vaginal candidiasis: will give diflucan 150 mg today and repeat in 72 hours. She has had several infections this year; will check hgb a1c   MD is aware of resident's narcotic use and is in agreement with current plan of care. We will attempt to wean resident as apropriate   Ok Edwards NP [facility]  Contact [phone] Monday through Friday 8am- 5pm  After hours call [phone]

## 2017-03-06 ENCOUNTER — Encounter: Payer: Self-pay | Admitting: Adult Health

## 2017-03-06 NOTE — Progress Notes (Signed)
Entered in error

## 2017-03-07 ENCOUNTER — Encounter: Payer: Self-pay | Admitting: Adult Health

## 2017-03-07 ENCOUNTER — Non-Acute Institutional Stay (SKILLED_NURSING_FACILITY): Payer: Medicare Other | Admitting: Adult Health

## 2017-03-07 DIAGNOSIS — K5909 Other constipation: Secondary | ICD-10-CM

## 2017-03-07 DIAGNOSIS — J301 Allergic rhinitis due to pollen: Secondary | ICD-10-CM | POA: Diagnosis not present

## 2017-03-07 DIAGNOSIS — J309 Allergic rhinitis, unspecified: Secondary | ICD-10-CM | POA: Insufficient documentation

## 2017-03-07 NOTE — Progress Notes (Signed)
Location:   [place] Room Number: [place] of Service:  SNF (31)   CODE STATUS: DNR  Allergies  Allergen Reactions  . Sulfonamide Derivatives Hives and Shortness Of Breath  . Penicillins Hives and Itching    Has patient had a PCN reaction causing immediate rash, facial/tongue/throat swelling, SOB or lightheadedness with hypotension: unknown Has patient had a PCN reaction causing severe rash involving mucus membranes or skin necrosis: unknown Has patient had a PCN reaction that required hospitalization: unknown Has patient had a PCN reaction occurring within the last 10 years: unknown If all of the above answers are "NO", then may proceed with Cephalosporin use. Prior course of rocephin charted 05/2015    Chief Complaint  Patient presents with  . Acute Visit    sore throat, abdominal pain    HPI:  She has been complaining of increased sinus congestion for the past several days. There are no reports of fever present. She is denying headaches; chest pain shortness of breath. She does have a sore throat. She has not attempted any treatments.  She is also complaining of abdominal pain due to constipation. She states that she has not a BM in the past 5 days. She denies any nausea of vomiting present.   Past Medical History:  Diagnosis Date  . Cardiomyopathy ([place])    a.  Echo 04/29/12: Mild LVH, EF 20-25%, mild AI, moderate MR, moderate LAE, mild RAE, mild RVE, moderate TR, PASP 51, small pericardial effusion;   b. probably non-ischemic given multiple chemo-Tx agents used for MS and global LV dysfn on echo  . Chronic systolic heart failure ([place])   . Depression   . Glaucoma   . Hypertension   . MS (multiple sclerosis) ([place])    a. Dx'd late 20's. b. Tx with Novantrone, Tysabri, Copaxone previously.    Past Surgical History:  Procedure Laterality Date  . ABLATION     uterine  . CESAREAN SECTION      Social History   Social History  . Marital status: Single     Spouse name: N/A  . Number of children: N/A  . Years of education: N/A   Occupational History  . Not on file.   Social History Main Topics  . Smoking status: Former Smoker    Packs/day: 0.50    Years: 5.00  . Smokeless tobacco: Never Used  . Alcohol use No  . Drug use: No  . Sexual activity: Not Currently   Other Topics Concern  . Not on file   Social History Narrative  . No narrative on file   Family History  Problem Relation Age of Onset  . Hypertension Mother   . Cancer Mother        breast   . Cancer Father        prostate  . Multiple sclerosis Sister   . Heart attack Neg Hx       VITAL SIGNS BP 140/70   Pulse 72   Temp (!) 97 F (36.1 C)   Resp 16   Ht _0  (1.651 m)   Wt 242 lb 12.8 oz (110.1 kg)   SpO2 95%   BMI 40.40 kg/m   Patient's Medications  New Prescriptions   No medications on file  Previous Medications   ACETAMINOPHEN (TYLENOL) 325 MG TABLET    Take 650 mg by mouth 3 (three) times daily.   ALBUTEROL (PROVENTIL) (2.5 MG/3ML) 0.083% NEBULIZER SOLUTION    Take 3 mLs (2.5  mg total) by nebulization every 2 (two) hours as needed for wheezing.   AMANTADINE (SYMMETREL) 100 MG CAPSULE    Take 100 mg by mouth daily.    BACLOFEN (LIORESAL) 20 MG TABLET    Take 1 tablet (20 mg total) by mouth 3 (three) times daily as needed for muscle spasms.   CARVEDILOL (COREG) 25 MG TABLET    Take 1 tablet (25 mg total) by mouth 2 (two) times daily with a meal.   CHOLECALCIFEROL (VITAMIN D-3 PO)    Take 1,000 Int'l Units/day by mouth at bedtime.    CRANBERRY PO    Take 100 mg by mouth at bedtime.    DULOXETINE (CYMBALTA) 60 MG CAPSULE    Take 60 mg by mouth daily.   FAMOTIDINE (PEPCID) 20 MG TABLET    Take 1 tablet (20 mg total) by mouth 2 (two) times daily.   GABAPENTIN (NEURONTIN) 100 MG CAPSULE    Give 7 tablets (700 mg) by mouth two times daily and 3 tablets (300 mg) by mouth at bedtime   HYDROCORTISONE CREAM 0.5 %    Apply to cheeks topically two times a  day for dry areas, avoid eyes   LACTASE (LACTAID PO)    Take 1 tablet by mouth at bedtime.    LACTULOSE 20 GM/30ML SOLN    Take 30 mLs by mouth daily as needed.    LATANOPROST (XALATAN) 0.005 % OPHTHALMIC SOLUTION    Place 1 drop into both eyes at bedtime.   MAGNESIUM HYDROXIDE (MILK OF MAGNESIA) 400 MG/5ML SUSPENSION    Take 30 mLs by mouth every 8 (eight) hours as needed for mild constipation or moderate constipation.   MENTHOL, TOPICAL ANALGESIC, (BIOFREEZE) 4 % GEL    Apply to left shoulder topically four times a day for pain   MIRABEGRON ER (MYRBETRIQ) 50 MG TB24 TABLET    Take 50 mg by mouth at bedtime.    PHENAZOPYRIDINE (PYRIDIUM) 100 MG TABLET    Take 100 mg by mouth every 8 (eight) hours as needed for pain.   POTASSIUM CHLORIDE (KLOR-CON) 20 MEQ PACKET    Take 20 mEq by mouth daily.    PROPYLENE GLYCOL (SYSTANE BALANCE) 0.6 % SOLN    Instill 1 drop in both eyes two times a day for dry eyes   SENNA-DOCUSATE (SENOKOT S) 8.6-50 MG TABLET    Take 1 tablet by mouth 2 (two) times daily.   SIMETHICONE (MYLICON) 80 MG CHEWABLE TABLET    Chew 80 mg by mouth every 6 (six) hours as needed for flatulence.   SPIRONOLACTONE (ALDACTONE) 25 MG TABLET    Take 25 mg by mouth daily. For CHF   TAMSULOSIN (FLOMAX) 0.4 MG CAPS CAPSULE    Take 0.4 mg by mouth daily. For incontinence   TORSEMIDE (DEMADEX) 20 MG TABLET    Take 20 mg by mouth daily.   ZOLPIDEM (AMBIEN CR) 6.25 MG CR TABLET    Take 1 tablet (6.25 mg total) by mouth at bedtime.  Modified Medications   No medications on file  Discontinued Medications   No medications on file     SIGNIFICANT DIAGNOSTIC EXAMS  PREVIOUS   10-08-14: MRI brain: No significant change in diffuse white matter lesions consistent with patient's history of multiple sclerosis. None of these areas demonstrate abnormal restriction or enhancement as may be seen with areas of acute demyelination. Global atrophy. Decrease in size right pituitary mass now measuring 1.2 x 0.5  x 0.9 cm versus prior 1.2  x 0.7 x 1.1 cm. Right parotid 1 cm lesion does not have typical appearance of an intra parotid lymph node and therefore primary parotid lesion is a possibility. ENT consultation may be considered for further delineation.  10-08-14: mri of cervical; spine: Scattered new areas of altered signal intensity (compared to 2007) throughout the cervical cord as detailed above consistent with patient's history of multiple sclerosis. None of these areas demonstrate enhancement as may be seen with areas of active demyelination. Cervical spondylotic changes as detailed above most prominent C5-6 level.  01-12-15: 2-d echo: Left ventricle: The cavity size was normal. There was mild concentric hypertrophy. Systolic function was normal. The estimated ejection fraction was in the range of 60% to 65%. Wall motion was normal; there were no regional wall motion abnormalities. Doppler parameters are consistent with abnormal left ventricular relaxation (grade 1 diastolic dysfunction). - Aortic valve: There was mild regurgitation. - Mitral valve: Calcified annulus. There was mild regurgitation.  07-14-15: EEG: This EEG is consistent with a mild generalized non-specific cerebral dysfunction(encephalopathy).   07-20-15: chest x-ray; Interval extubation. Decreasing lung volumes with increasing bibasilar opacities, likely atelectasis.  11-07-15: ct of head; No acute intracranial abnormality. White matter changes are suggestive for chronic small vessel ischemic disease. Right maxillary sinus disease.   NO NEW EXAMS   LABS REVIEWED: PREVIOUS    03-03-16: wbc 5.3; hgb 10.0; hct 32.6; mcv 81.9; plt 242; glucose 149; bun 15.2; creat 1.26; k+ 3.4; na++ 143; ast 140; albumin 3.9; vit B 12: >2000 03-23-16: glucose 94; bun 18.5; creat 1.42; k+ 4.1; na++ 142  08-09-16: chol 159; ldl 109; trig 109; hdl 28  09-10-16: wbc 4.9; hgb 10.5; hct 33.2; mcv 86.6; plt 253  glucose 164; bun 9.0; creat 1.12; k+ 3.8; na++ 14;  liver normal albumin 3.8 chol 200; ldl 142; trig 144; hdl 29  11-20-16: urine culture: providencia stuartii: fosfomycin 3 gm 12-12-16: urine culture: providencia stuartii: rocephin  01-29-17: wbc 5.1; hgb 10.8; hct 32.1; mcv 85.2; plt 262; glucose 128; bun 17.1; creat 1.25; k+ 4.2; na++ 144; alk phos 138; albumin 3.9  NO NEW LABS    Review of Systems  Constitutional: Negative for malaise/fatigue.  Respiratory: Negative for cough and shortness of breath.   Cardiovascular: Negative for chest pain, palpitations and leg swelling.  Gastrointestinal: Positive for abdominal pain and constipation. Negative for heartburn.  Genitourinary:          Musculoskeletal: Negative for back pain and myalgias.  Skin: Negative.   Neurological: Negative for dizziness.       Unable to move lower extremities   Psychiatric/Behavioral: The patient is not nervous/anxious.     Physical Exam  Constitutional: She is oriented to person, place, and time. No distress.  Obese   Eyes: Conjunctivae are normal.  Neck: Neck supple. No JVD present. No thyromegaly present.  Cardiovascular: Normal rate, regular rhythm and intact distal pulses.   Respiratory: Effort normal and breath sounds normal. No respiratory distress. She has no wheezes.  GI: Soft. Bowel sounds are normal. She exhibits no distension. There is tenderness.  Has tenderness in the epigastric area rectal exam without stool in rectum   Musculoskeletal: She exhibits no edema.  Limited movement of upper extremities  Unable to move lower extremities Left shoulder pain with movement   Lymphadenopathy:    She has no cervical adenopathy.  Neurological: She is alert and oriented to person, place, and time.  Skin: Skin is warm and dry. She is not diaphoretic.  Psychiatric:  She has a normal mood and affect.    ASSESSMENT/ PLAN:  1. Constipation:  Is worse; will increase senna s to 2 tabs twice daily   2. Allergic rhinitis: will begin claritin 10 mg daily    MD is aware of resident's narcotic use and is in agreement with current plan of care. We will attempt to wean resident as apropriate     Ok Edwards NP [facility]  Contact [phone] Monday through Friday 8am- 5pm  After hours call [phone]

## 2017-03-13 ENCOUNTER — Encounter: Payer: Self-pay | Admitting: Adult Health

## 2017-03-13 ENCOUNTER — Non-Acute Institutional Stay (SKILLED_NURSING_FACILITY): Payer: Medicare Other | Admitting: Adult Health

## 2017-03-13 DIAGNOSIS — I5042 Chronic combined systolic (congestive) and diastolic (congestive) heart failure: Secondary | ICD-10-CM

## 2017-03-13 DIAGNOSIS — G35 Multiple sclerosis: Secondary | ICD-10-CM | POA: Diagnosis not present

## 2017-03-13 DIAGNOSIS — G8220 Paraplegia, unspecified: Secondary | ICD-10-CM

## 2017-03-13 NOTE — Progress Notes (Signed)
Location:   [facility] Room Number: [place] of Service:  SNF (31)   CODE STATUS: DNR  Allergies  Allergen Reactions  . Sulfonamide Derivatives Hives and Shortness Of Breath  . Penicillins Hives and Itching    Chief Complaint  Patient presents with  . Acute Visit    Care plan meeting    HPI:  This is her quarterly care plan meeting with the care plan team. She has no medical concerns today. She is going to a luncheon about MS in the next couple of weeks to see if she would benefit from another treatment. She did have nursing complaints which were addressed. She did not have questions or concerns about her medication regimen.    Past Medical History:  Diagnosis Date  . Cardiomyopathy ([place])    a.  Echo 04/29/12: Mild LVH, EF 20-25%, mild AI, moderate MR, moderate LAE, mild RAE, mild RVE, moderate TR, PASP 51, small pericardial effusion;   b. probably non-ischemic given multiple chemo-Tx agents used for MS and global LV dysfn on echo  . Chronic systolic heart failure ([place])   . Depression   . Glaucoma   . Hypertension   . MS (multiple sclerosis) ([place])    a. Dx'd late 20's. b. Tx with Novantrone, Tysabri, Copaxone previously.    Past Surgical History:  Procedure Laterality Date  . ABLATION     uterine  . CESAREAN SECTION      Social History   Social History  . Marital status: Single    Spouse name: N/A  . Number of children: N/A  . Years of education: N/A   Occupational History  . Not on file.   Social History Main Topics  . Smoking status: Former Smoker    Packs/day: 0.50    Years: 5.00  . Smokeless tobacco: Never Used  . Alcohol use No  . Drug use: No  . Sexual activity: Not Currently   Other Topics Concern  . Not on file   Social History Narrative  . No narrative on file   Family History  Problem Relation Age of Onset  . Hypertension Mother   . Cancer Mother        breast   . Cancer Father        prostate  . Multiple sclerosis  Sister   . Heart attack Neg Hx       VITAL SIGNS BP 138/78   Pulse 70   Temp (!) 97.3 F (36.3 C)   Ht _0  (1.651 m)   Wt 243 lb (110.2 kg)   BMI 40.44 kg/m   Patient's Medications  New Prescriptions   No medications on file  Previous Medications   ACETAMINOPHEN (TYLENOL) 325 MG TABLET    Take 650 mg by mouth 3 (three) times daily.   ALBUTEROL (PROVENTIL) (2.5 MG/3ML) 0.083% NEBULIZER SOLUTION    Take 3 mLs (2.5 mg total) by nebulization every 2 (two) hours as needed for wheezing.   AMANTADINE (SYMMETREL) 100 MG CAPSULE    Take 100 mg by mouth daily.    BACLOFEN (LIORESAL) 20 MG TABLET    Take 1 tablet (20 mg total) by mouth 3 (three) times daily as needed for muscle spasms.   CARVEDILOL (COREG) 25 MG TABLET    Take 1 tablet (25 mg total) by mouth 2 (two) times daily with a meal.   CHOLECALCIFEROL (VITAMIN D-3 PO)    Take 1,000 Int'l Units/day by mouth at bedtime.  CRANBERRY PO    Take 100 mg by mouth at bedtime.    DULOXETINE (CYMBALTA) 60 MG CAPSULE    Take 60 mg by mouth daily.   FAMOTIDINE (PEPCID) 20 MG TABLET    Take 1 tablet (20 mg total) by mouth 2 (two) times daily.   GABAPENTIN (NEURONTIN) 100 MG CAPSULE    Give 7 tablets (700 mg) by mouth two times daily and 3 tablets (300 mg) by mouth at bedtime   HYDROCORTISONE CREAM 0.5 %    Apply to cheeks topically two times a day for dry areas, avoid eyes   LACTASE (LACTAID PO)    Take 1 tablet by mouth at bedtime.    LACTULOSE 20 GM/30ML SOLN    Take 30 mLs by mouth daily as needed.    LATANOPROST (XALATAN) 0.005 % OPHTHALMIC SOLUTION    Place 1 drop into both eyes at bedtime.   LORATADINE (CLARITIN) 10 MG TABLET    Take 10 mg by mouth daily.   MAGNESIUM HYDROXIDE (MILK OF MAGNESIA) 400 MG/5ML SUSPENSION    Take 30 mLs by mouth every 8 (eight) hours as needed for mild constipation or moderate constipation.   MENTHOL, TOPICAL ANALGESIC, (BIOFREEZE) 4 % GEL    Apply to left shoulder topically four times a day for pain    MIRABEGRON ER (MYRBETRIQ) 50 MG TB24 TABLET    Take 50 mg by mouth at bedtime.    PHENAZOPYRIDINE (PYRIDIUM) 100 MG TABLET    Take 100 mg by mouth every 8 (eight) hours as needed for pain.   POTASSIUM CHLORIDE (KLOR-CON) 20 MEQ PACKET    Take 20 mEq by mouth daily.    PROPYLENE GLYCOL (SYSTANE BALANCE) 0.6 % SOLN    Instill 1 drop in both eyes two times a day for dry eyes   SENNA-DOCUSATE (SENOKOT S) 8.6-50 MG TABLET    Take 1 tablet by mouth 2 (two) times daily.   SIMETHICONE (MYLICON) 80 MG CHEWABLE TABLET    Chew 80 mg by mouth every 6 (six) hours as needed for flatulence.   SPIRONOLACTONE (ALDACTONE) 25 MG TABLET    Take 25 mg by mouth daily. For CHF   TAMSULOSIN (FLOMAX) 0.4 MG CAPS CAPSULE    Take 0.4 mg by mouth daily. For incontinence   TORSEMIDE (DEMADEX) 20 MG TABLET    Take 20 mg by mouth daily.   ZOLPIDEM (AMBIEN CR) 6.25 MG CR TABLET    Take 1 tablet (6.25 mg total) by mouth at bedtime.  Modified Medications   No medications on file  Discontinued Medications   ONDANSETRON (ZOFRAN) 4 MG TABLET    Take 4 mg by mouth every 6 (six) hours as needed for nausea or vomiting.   POLYETHYLENE GLYCOL (MIRALAX / GLYCOLAX) PACKET    Take 17 g by mouth at bedtime.     SIGNIFICANT DIAGNOSTIC EXAMS  PREVIOUS   10-08-14: MRI brain: No significant change in diffuse white matter lesions consistent with patient's history of multiple sclerosis. None of these areas demonstrate abnormal restriction or enhancement as may be seen with areas of acute demyelination. Global atrophy. 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 Right parotid 1 cm lesion does not have typical appearance of an intra parotid lymph node and therefore primary parotid lesion is a possibility. ENT consultation may be considered for further delineation.  10-08-14: mri of cervical; spine: Scattered new areas of altered signal  intensity (compared to 2007) throughout the  cervical cord as detailed above consistent with patient's history of multiple sclerosis. None of these areas demonstrate enhancement as may be seen with areas of active demyelination. Cervical spondylotic changes as detailed above most prominent C5-6 level.  01-12-15: 2-d echo: Left ventricle: The cavity size was normal. There was mild concentric hypertrophy. Systolic function was normal. The estimated ejection fraction was in the range of 60% to 65%. Wall motion was normal; there were no regional wall motion abnormalities. Doppler parameters are consistent with abnormal left ventricular relaxation (grade 1 diastolic dysfunction). - Aortic valve: There was mild regurgitation. - Mitral valve: Calcified annulus. There was mild regurgitation.  07-14-15: EEG: This EEG is consistent with a mild generalized non-specific cerebral dysfunction(encephalopathy).   07-20-15: chest x-ray; Interval extubation. Decreasing lung volumes with increasing bibasilar opacities, likely atelectasis.  11-07-15: ct of head; No acute intracranial abnormality. White matter changes are suggestive for chronic small vessel ischemic disease. Right maxillary sinus disease.   NO NEW EXAMS   LABS REVIEWED: PREVIOUS    03-03-16: wbc 5.3; hgb 10.0; hct 32.6; mcv 81.9; plt 242; glucose 149; bun 15.2; creat 1.26; k+ 3.4; na++ 143; ast 140; albumin 3.9; vit B 12: >2000 03-23-16: glucose 94; bun 18.5; creat 1.42; k+ 4.1; na++ 142  08-09-16: chol 159; ldl 109; trig 109; hdl 28  09-10-16: wbc 4.9; hgb 10.5; hct 33.2; mcv 86.6; plt 253  glucose 164; bun 9.0; creat 1.12; k+ 3.8; na++ 14; liver normal albumin 3.8 chol 200; ldl 142; trig 144; hdl 29  11-20-16: urine culture: providencia stuartii: fosfomycin 3 gm 12-12-16: urine culture: providencia stuartii: rocephin  01-29-17: wbc 5.1; hgb 10.8; hct 32.1; mcv 85.2; plt 262; glucose 128; bun 17.1; creat 1.25; k+ 4.2; na++ 144; alk phos 138; albumin 3.9  TODAY:  02-27-17:wbc 6.5; hgb 9.5; hct 30.4;  mcv 88.2; plt 254; glucose 123; bun 21; creat 1.40; k+ 4.8; na++ 141; ca 9.1; alk phos 137; albumin 3.6    Review of Systems  Constitutional: Negative for malaise/fatigue.  Respiratory: Negative for cough and shortness of breath.   Cardiovascular: Negative for chest pain, palpitations and leg swelling.  Gastrointestinal: Negative for abdominal pain, constipation and heartburn.  Musculoskeletal: Negative for back pain, joint pain and myalgias.       Unable to move lower extremities   Skin: Negative.   Neurological: Negative for dizziness.  Psychiatric/Behavioral: The patient is not nervous/anxious.    Physical Exam  Constitutional: She is oriented to person, place, and time. No distress.  Eyes: Conjunctivae are normal.  Neck: Neck supple. No JVD present. No thyromegaly present.  Cardiovascular: Normal rate, regular rhythm and intact distal pulses.   Respiratory: Effort normal and breath sounds normal. No respiratory distress. She has no wheezes.  GI: Soft. Bowel sounds are normal. She exhibits no distension. There is no tenderness.  Musculoskeletal: She exhibits no edema.  Limited movement of upper extremities  Unable to move lower extremities Left shoulder pain with movement     Lymphadenopathy:    She has no cervical adenopathy.  Neurological: She is alert and oriented to person, place, and time.  Skin: Skin is warm and dry. She is not diaphoretic.  Psychiatric: She has a normal mood and affect.    ASSESSMENT/ PLAN:  1. MS 2.  Chronic combined systolic and diastolic heart failure 3. Paraplegia:   At this time will continue her current plan of care.  Will continue to encourage her to engage with  those around her.   Time spent with patient 40 minutes: to include disease status and treatment; medications; and future care needs.   Ok Edwards NP [facility]  Contact [phone] Monday through Friday 8am- 5pm  After hours call [phone]

## 2017-03-21 ENCOUNTER — Encounter: Payer: Self-pay | Admitting: Gastroenterology

## 2017-03-21 ENCOUNTER — Other Ambulatory Visit: Payer: Self-pay

## 2017-03-21 MED ORDER — ZOLPIDEM TARTRATE ER 6.25 MG PO TBCR: 6.2500 mg | EXTENDED_RELEASE_TABLET | Freq: Every day | ORAL | 0 refills | Status: DC

## 2017-03-21 NOTE — Telephone Encounter (Signed)
RX faxed to [facility] @ [phone], phone number [phone] 

## 2017-03-27 ENCOUNTER — Other Ambulatory Visit: Payer: Self-pay

## 2017-03-27 MED ORDER — ZOLPIDEM TARTRATE 5 MG PO TABS: 5.0000 mg | ORAL_TABLET | Freq: Every day | ORAL | 0 refills | Status: DC

## 2017-03-27 NOTE — Telephone Encounter (Signed)
RX faxed to [facility] @ [phone], phone number [phone] 

## 2017-03-29 ENCOUNTER — Non-Acute Institutional Stay (SKILLED_NURSING_FACILITY): Payer: Medicare Other | Admitting: Adult Health

## 2017-03-29 ENCOUNTER — Encounter: Payer: Self-pay | Admitting: Adult Health

## 2017-03-29 DIAGNOSIS — F32A Depression, unspecified: Secondary | ICD-10-CM

## 2017-03-29 DIAGNOSIS — I5042 Chronic combined systolic (congestive) and diastolic (congestive) heart failure: Secondary | ICD-10-CM | POA: Diagnosis not present

## 2017-03-29 DIAGNOSIS — K219 Gastro-esophageal reflux disease without esophagitis: Secondary | ICD-10-CM | POA: Diagnosis not present

## 2017-03-29 DIAGNOSIS — F329 Major depressive disorder, single episode, unspecified: Secondary | ICD-10-CM

## 2017-03-29 DIAGNOSIS — I110 Hypertensive heart disease with heart failure: Secondary | ICD-10-CM

## 2017-03-29 DIAGNOSIS — N319 Neuromuscular dysfunction of bladder, unspecified: Secondary | ICD-10-CM

## 2017-03-29 DIAGNOSIS — H401130 Primary open-angle glaucoma, bilateral, stage unspecified: Secondary | ICD-10-CM | POA: Diagnosis not present

## 2017-03-29 NOTE — Progress Notes (Signed)
Location:   [facility] Room Number: [place] of Service:  SNF (31)   CODE STATUS: DNR  Allergies  Allergen Reactions  . Sulfonamide Derivatives Hives and Shortness Of Breath  . Penicillins Hives and Itching    Has patient had a PCN reaction causing immediate rash, facial/tongue/throat swelling, SOB or lightheadedness with hypotension: unknown Has patient had a PCN reaction causing severe rash involving mucus membranes or skin necrosis: unknown Has patient had a PCN reaction that required hospitalization: unknown Has patient had a PCN reaction occurring within the last 10 years: unknown If all of the above answers are "NO", then may proceed with Cephalosporin use. Prior course of rocephin charted 05/2015    Chief Complaint  Patient presents with  . Medical Management of Chronic Issues    Depression; hypertension; neurogenic bladder; gerd and glaucoma    HPI:  She is a 56 year old long term resident of this facility being seen for the management of her chronic illnesses: depression hypertension; neurogenic bladder; gerd and glaucoma. She does spend most of her time in bed. She denies any pain; any changes in appetite; no heart burn. There are no nursing concerns at this time.   Past Medical History:  Diagnosis Date  . Cardiomyopathy ([place])    a.  Echo 04/29/12: Mild LVH, EF 20-25%, mild AI, moderate MR, moderate LAE, mild RAE, mild RVE, moderate TR, PASP 51, small pericardial effusion;   b. probably non-ischemic given multiple chemo-Tx agents used for MS and global LV dysfn on echo  . Chronic systolic heart failure ([place])   . Depression   . Glaucoma   . Hypertension   . MS (multiple sclerosis) ([place])    a. Dx'd late 20's. b. Tx with Novantrone, Tysabri, Copaxone previously.    Past Surgical History:  Procedure Laterality Date  . ABLATION     uterine  . CESAREAN SECTION      Social History   Social History  . Marital status: Single    Spouse name: N/A    . Number of children: N/A  . Years of education: N/A   Occupational History  . Not on file.   Social History Main Topics  . Smoking status: Former Smoker    Packs/day: 0.50    Years: 5.00  . Smokeless tobacco: Never Used  . Alcohol use No  . Drug use: No  . Sexual activity: Not Currently   Other Topics Concern  . Not on file   Social History Narrative  . No narrative on file   Family History  Problem Relation Age of Onset  . Hypertension Mother   . Cancer Mother        breast   . Cancer Father        prostate  . Multiple sclerosis Sister   . Heart attack Neg Hx       VITAL SIGNS BP 118/74   Pulse 74   Temp (!) 97 F (36.1 C)   Resp 17   Ht '5\' 5"'$  (1.651 m)   Wt 243 lb (110.2 kg)   SpO2 95%   BMI 40.44 kg/m   Patient's Medications  New Prescriptions   No medications on file  Previous Medications   ACETAMINOPHEN (TYLENOL) 325 MG TABLET    Take 650 mg by mouth 3 (three) times daily.   ALBUTEROL (PROVENTIL) (2.5 MG/3ML) 0.083% NEBULIZER SOLUTION    Take 3 mLs (2.5 mg total) by nebulization every 2 (two) hours as needed for  wheezing.   AMANTADINE (SYMMETREL) 100 MG CAPSULE    Take 100 mg by mouth daily.    BACLOFEN (LIORESAL) 20 MG TABLET    Take 1 tablet (20 mg total) by mouth 3 (three) times daily as needed for muscle spasms.   CARVEDILOL (COREG) 25 MG TABLET    Take 1 tablet (25 mg total) by mouth 2 (two) times daily with a meal.   CHOLECALCIFEROL (VITAMIN D-3 PO)    Take 1,000 Int'l Units/day by mouth at bedtime.    CRANBERRY PO    Take 100 mg by mouth at bedtime.    DULOXETINE (CYMBALTA) 60 MG CAPSULE    Take 60 mg by mouth daily.   FAMOTIDINE (PEPCID) 20 MG TABLET    Take 1 tablet (20 mg total) by mouth 2 (two) times daily.   GABAPENTIN (NEURONTIN) 100 MG CAPSULE    Give 7 tablets (700 mg) by mouth two times daily and 3 tablets (300 mg) by mouth at bedtime   HYDROCORTISONE CREAM 0.5 %    Apply to cheeks topically two times a day for dry areas, avoid eyes    LACTASE (LACTAID PO)    Take 1 tablet by mouth at bedtime.    LACTULOSE 20 GM/30ML SOLN    Take 30 mLs by mouth daily as needed.    LATANOPROST (XALATAN) 0.005 % OPHTHALMIC SOLUTION    Place 1 drop into both eyes at bedtime.   LORATADINE (CLARITIN) 10 MG TABLET    Take 10 mg by mouth daily.   MAGNESIUM HYDROXIDE (MILK OF MAGNESIA) 400 MG/5ML SUSPENSION    Take 30 mLs by mouth every 8 (eight) hours as needed for mild constipation or moderate constipation.   MENTHOL, TOPICAL ANALGESIC, (BIOFREEZE) 4 % GEL    Apply to left shoulder topically four times a day for pain   MIRABEGRON ER (MYRBETRIQ) 50 MG TB24 TABLET    Take 50 mg by mouth at bedtime.    PHENAZOPYRIDINE (PYRIDIUM) 100 MG TABLET    Take 100 mg by mouth every 8 (eight) hours as needed for pain.   POTASSIUM CHLORIDE (KLOR-CON) 20 MEQ PACKET    Take 20 mEq by mouth daily.    PROPYLENE GLYCOL (SYSTANE BALANCE) 0.6 % SOLN    Instill 1 drop in both eyes two times a day for dry eyes   SENNA-DOCUSATE (SENOKOT S) 8.6-50 MG TABLET    Take 2 tablets by mouth 2 (two) times daily.    SIMETHICONE (MYLICON) 80 MG CHEWABLE TABLET    Chew 80 mg by mouth every 6 (six) hours as needed for flatulence.   SPIRONOLACTONE (ALDACTONE) 25 MG TABLET    Take 25 mg by mouth daily. For CHF   TAMSULOSIN (FLOMAX) 0.4 MG CAPS CAPSULE    Take 0.4 mg by mouth daily. For incontinence   TORSEMIDE (DEMADEX) 20 MG TABLET    Take 20 mg by mouth daily.   ZOLPIDEM (AMBIEN) 5 MG TABLET    Take 1 tablet (5 mg total) by mouth at bedtime.  Modified Medications   No medications on file  Discontinued Medications   No medications on file     SIGNIFICANT DIAGNOSTIC EXAMS  PREVIOUS   10-08-14: MRI brain: No significant change in diffuse white matter lesions consistent with patient's history of multiple sclerosis. None of these areas demonstrate abnormal restriction or enhancement as may be seen with areas of acute demyelination. Global atrophy. Decrease in size right pituitary  mass now measuring 1.2 x 0.5 x 0.9  cm versus prior 1.2 x 0.7 x 1.1 cm. Right parotid 1 cm lesion does not have typical appearance of an intra parotid lymph node and therefore primary parotid lesion is a possibility. ENT consultation may be considered for further delineation.  10-08-14: mri of cervical; spine: Scattered new areas of altered signal intensity (compared to 2007) throughout the cervical cord as detailed above consistent with patient's history of multiple sclerosis. None of these areas demonstrate enhancement as may be seen with areas of active demyelination. Cervical spondylotic changes as detailed above most prominent C5-6 level.  01-12-15: 2-d echo: Left ventricle: The cavity size was normal. There was mild concentric hypertrophy. Systolic function was normal. The estimated ejection fraction was in the range of 60% to 65%. Wall motion was normal; there were no regional wall motion abnormalities. Doppler parameters are consistent with abnormal left ventricular relaxation (grade 1 diastolic dysfunction). - Aortic valve: There was mild regurgitation. - Mitral valve: Calcified annulus. There was mild regurgitation.  07-14-15: EEG: This EEG is consistent with a mild generalized non-specific cerebral dysfunction(encephalopathy).   07-20-15: chest x-ray; Interval extubation. Decreasing lung volumes with increasing bibasilar opacities, likely atelectasis.  11-07-15: ct of head; No acute intracranial abnormality. White matter changes are suggestive for chronic small vessel ischemic disease. Right maxillary sinus disease.   NO NEW EXAMS   LABS REVIEWED: PREVIOUS    03-03-16: wbc 5.3; hgb 10.0; hct 32.6; mcv 81.9; plt 242; glucose 149; bun 15.2; creat 1.26; k+ 3.4; na++ 143; ast 140; albumin 3.9; vit B 12: >2000 03-23-16: glucose 94; bun 18.5; creat 1.42; k+ 4.1; na++ 142  08-09-16: chol 159; ldl 109; trig 109; hdl 28  09-10-16: wbc 4.9; hgb 10.5; hct 33.2; mcv 86.6; plt 253  glucose 164; bun 9.0;  creat 1.12; k+ 3.8; na++ 14; liver normal albumin 3.8 chol 200; ldl 142; trig 144; hdl 29  11-20-16: urine culture: providencia stuartii: fosfomycin 3 gm 12-12-16: urine culture: providencia stuartii: rocephin  01-29-17: wbc 5.1; hgb 10.8; hct 32.1; mcv 85.2; plt 262; glucose 128; bun 17.1; creat 1.25; k+ 4.2; na++ 144; alk phos 138; albumin 3.9  02-27-17:wbc 6.5; hgb 9.5; hct 30.4; mcv 88.2; plt 254; glucose 123; bun 21; creat 1.40; k+ 4.8; na++ 141; ca 9.1; alk phos 137; albumin 3.6  NO NEW LABS     Review of Systems  Constitutional: Negative for malaise/fatigue.  Respiratory: Negative for cough and shortness of breath.   Cardiovascular: Negative for chest pain, palpitations and leg swelling.  Gastrointestinal: Negative for abdominal pain, constipation and heartburn.  Musculoskeletal: Negative for back pain, joint pain and myalgias.  Skin: Negative.   Neurological: Negative for dizziness.  Psychiatric/Behavioral: The patient is not nervous/anxious.    Physical Exam  Constitutional: She is oriented to person, place, and time. She appears well-developed and well-nourished. No distress.  Neck: Neck supple. No thyromegaly present.  Cardiovascular: Normal rate, regular rhythm, normal heart sounds and intact distal pulses.   Pulmonary/Chest: Effort normal and breath sounds normal. No respiratory distress.  Abdominal: Soft. Bowel sounds are normal. She exhibits no distension. There is no tenderness.  Musculoskeletal: She exhibits no edema.  Limited movement of upper extremities  Unable to move lower extremities Left shoulder pain with movement    Lymphadenopathy:    She has no cervical adenopathy.  Neurological: She is alert and oriented to person, place, and time.  Skin: Skin is warm and dry. She is not diaphoretic.  Psychiatric: She has a normal mood and affect.  ASSESSMENT/ PLAN: glaucoma TODAY:   1. Depression: is presently stable  taking cymbalta 60 mg daily; which also helps with  her pain management.  ambien 5  mg nightly   2. Hypertension b/p118/74 : is stable  will continue  coreg 25 mg twice daily and will monitor   3. Neurogenic bladder: no change in status  takes flomax 0.4 mg daily  myrbetriq 50 mg daily   I/O cath every 6 hours as needed  4. Jerrye Bushy: is stable  will continue pepcid 20 mg twice daily   5. Glaucoma:  Is stable will continue xalatan to both eyes.     PREVIOUS    6. MS: is slowly declining   Has been  followed by  neurology;  Has paraplegia.will continue amantadine 100 mg daily baclofen 20 mg tid prn  for muscle spasms;   Saw neurology on 09-26-16   7. Chronic pain: is stable:   will continue  neurontin 700 mg twice  daily; takes cymbalta 60 mg daily   tylenol 650 mg three times daily routinely  8. Chronic combined systolic and diastolic heart failure:EF is 60-65%(01-12-15)    is stable will continue demadex 20  mg daily; with k+ 20 meq daily; will continue aldactone 25 mg daily;    9. Constipation:  Is stable; will continue senna s twice daily and 30 cc daily and will monitor      MD is aware of resident's narcotic use and is in agreement with current plan of care. We will attempt to wean resident as apropriate     Ok Edwards NP [facility]  Contact [phone] Monday through Friday 8am- 5pm  After hours call [phone]

## 2017-04-02 ENCOUNTER — Encounter: Payer: Self-pay | Admitting: Adult Health

## 2017-04-02 ENCOUNTER — Non-Acute Institutional Stay (SKILLED_NURSING_FACILITY): Payer: Medicare Other | Admitting: Adult Health

## 2017-04-02 ENCOUNTER — Ambulatory Visit: Payer: Medicare Other | Admitting: Neurology

## 2017-04-02 DIAGNOSIS — R062 Wheezing: Secondary | ICD-10-CM | POA: Diagnosis not present

## 2017-04-02 DIAGNOSIS — J111 Influenza due to unidentified influenza virus with other respiratory manifestations: Secondary | ICD-10-CM | POA: Diagnosis not present

## 2017-04-02 DIAGNOSIS — I10 Essential (primary) hypertension: Secondary | ICD-10-CM | POA: Diagnosis not present

## 2017-04-02 DIAGNOSIS — D649 Anemia, unspecified: Secondary | ICD-10-CM | POA: Diagnosis not present

## 2017-04-02 DIAGNOSIS — G35 Multiple sclerosis: Secondary | ICD-10-CM | POA: Diagnosis not present

## 2017-04-02 LAB — CBC AND DIFFERENTIAL
HCT: 38 (ref 36–46)
Hemoglobin: 11.7 — AB (ref 12.0–16.0)
NEUTROS ABS: 6
PLATELETS: 285 (ref 150–399)
WBC: 7.2

## 2017-04-02 NOTE — Progress Notes (Signed)
Location:   [place] Room Number: [place] of Service:  SNF (31)   CODE STATUS: DNR  Allergies  Allergen Reactions  . Sulfonamide Derivatives Hives and Shortness Of Breath  . Penicillins Hives and Itching    Has patient had a PCN reaction causing immediate rash, facial/tongue/throat swelling, SOB or lightheadedness with hypotension: unknown Has patient had a PCN reaction causing severe rash involving mucus membranes or skin necrosis: unknown Has patient had a PCN reaction that required hospitalization: unknown Has patient had a PCN reaction occurring within the last 10 years: unknown If all of the above answers are "NO", then may proceed with Cephalosporin use. Prior course of rocephin charted 05/2015    Chief Complaint  Patient presents with  . Acute Visit    flu like symptoms    HPI:  Yesterday she started experiencing body aches and pain; increased fatigue; productive cough; sore throat and fever . Current temp is 99.7. She states that she is feeling extremely bad. Nursing staff is concerned that she may have influenza.   Past Medical History:  Diagnosis Date  . Cardiomyopathy ([place])    a.  Echo 04/29/12: Mild LVH, EF 20-25%, mild AI, moderate MR, moderate LAE, mild RAE, mild RVE, moderate TR, PASP 51, small pericardial effusion;   b. probably non-ischemic given multiple chemo-Tx agents used for MS and global LV dysfn on echo  . Chronic systolic heart failure ([place])   . Depression   . Glaucoma   . Hypertension   . MS (multiple sclerosis) ([place])    a. Dx'd late 20's. b. Tx with Novantrone, Tysabri, Copaxone previously.    Past Surgical History:  Procedure Laterality Date  . ABLATION     uterine  . CESAREAN SECTION      Social History   Social History  . Marital status: Single    Spouse name: N/A  . Number of children: N/A  . Years of education: N/A   Occupational History  . Not on file.   Social History Main Topics  . Smoking status:  Former Smoker    Packs/day: 0.50    Years: 5.00  . Smokeless tobacco: Never Used  . Alcohol use No  . Drug use: No  . Sexual activity: Not Currently   Other Topics Concern  . Not on file   Social History Narrative  . No narrative on file   Family History  Problem Relation Age of Onset  . Hypertension Mother   . Cancer Mother        breast   . Cancer Father        prostate  . Multiple sclerosis Sister   . Heart attack Neg Hx       VITAL SIGNS BP 108/83   Pulse (!) 108   Temp 99.7 F (37.6 C)   Resp 18   Ht _0  (1.651 m)   Wt 243 lb (110.2 kg)   SpO2 96%   BMI 40.44 kg/m    Patient's Medications  New Prescriptions   No medications on file  Previous Medications   ACETAMINOPHEN (TYLENOL) 325 MG TABLET    Take 650 mg by mouth 3 (three) times daily.   ALBUTEROL (PROVENTIL) (2.5 MG/3ML) 0.083% NEBULIZER SOLUTION    Take 3 mLs (2.5 mg total) by nebulization every 2 (two) hours as needed for wheezing.   AMANTADINE (SYMMETREL) 100 MG CAPSULE    Take 100 mg by mouth daily.    BACLOFEN (LIORESAL) 20 MG TABLET  Take 1 tablet (20 mg total) by mouth 3 (three) times daily as needed for muscle spasms.   CARVEDILOL (COREG) 25 MG TABLET    Take 1 tablet (25 mg total) by mouth 2 (two) times daily with a meal.   CHOLECALCIFEROL (VITAMIN D-3 PO)    Take 1,000 Int'l Units/day by mouth at bedtime.    CRANBERRY PO    Take 100 mg by mouth at bedtime.    DULOXETINE (CYMBALTA) 60 MG CAPSULE    Take 60 mg by mouth daily.   FAMOTIDINE (PEPCID) 20 MG TABLET    Take 1 tablet (20 mg total) by mouth 2 (two) times daily.   GABAPENTIN (NEURONTIN) 100 MG CAPSULE    Give 7 tablets (700 mg) by mouth two times daily and 3 tablets (300 mg) by mouth at bedtime   GUAIFENESIN (MUCINEX) 600 MG 12 HR TABLET    Take 600 mg by mouth 2 (two) times daily.   HYDROCORTISONE CREAM 0.5 %    Apply to cheeks topically two times a day for dry areas, avoid eyes   LACTASE (LACTAID PO)    Take 1 tablet by mouth at  bedtime.    LACTULOSE 20 GM/30ML SOLN    Take 30 mLs by mouth daily as needed.    LATANOPROST (XALATAN) 0.005 % OPHTHALMIC SOLUTION    Place 1 drop into both eyes at bedtime.   LORATADINE (CLARITIN) 10 MG TABLET    Take 10 mg by mouth daily.   MAGNESIUM HYDROXIDE (MILK OF MAGNESIA) 400 MG/5ML SUSPENSION    Take 30 mLs by mouth every 8 (eight) hours as needed for mild constipation or moderate constipation.   MENTHOL, TOPICAL ANALGESIC, (BIOFREEZE) 4 % GEL    Apply to left shoulder topically four times a day for pain   MIRABEGRON ER (MYRBETRIQ) 50 MG TB24 TABLET    Take 50 mg by mouth at bedtime.    PHENAZOPYRIDINE (PYRIDIUM) 100 MG TABLET    Take 100 mg by mouth every 8 (eight) hours as needed for pain.   POTASSIUM CHLORIDE (KLOR-CON) 20 MEQ PACKET    Take 20 mEq by mouth daily.    PROPYLENE GLYCOL (SYSTANE BALANCE) 0.6 % SOLN    Instill 1 drop in both eyes two times a day for dry eyes   PSEUDOEPHEDRINE (SUDAFED) 30 MG TABLET    Take 30 mg by mouth 2 (two) times daily.   SENNA-DOCUSATE (SENOKOT S) 8.6-50 MG TABLET    Take 2 tablets by mouth 2 (two) times daily.    SIMETHICONE (MYLICON) 80 MG CHEWABLE TABLET    Chew 80 mg by mouth every 6 (six) hours as needed for flatulence.   SPIRONOLACTONE (ALDACTONE) 25 MG TABLET    Take 25 mg by mouth daily. For CHF   TAMSULOSIN (FLOMAX) 0.4 MG CAPS CAPSULE    Take 0.4 mg by mouth daily. For incontinence   TORSEMIDE (DEMADEX) 20 MG TABLET    Take 20 mg by mouth daily.   ZOLPIDEM (AMBIEN) 5 MG TABLET    Take 1 tablet (5 mg total) by mouth at bedtime.  Modified Medications   No medications on file  Discontinued Medications   No medications on file     SIGNIFICANT DIAGNOSTIC EXAMS  PREVIOUS   10-08-14: MRI brain: No significant change in diffuse white matter lesions consistent with patient's history of multiple sclerosis. None of these areas demonstrate abnormal restriction or enhancement as may be seen with areas of acute demyelination. Global  atrophy. Decrease  in size right pituitary mass now measuring 1.2 x 0.5 x 0.9 cm versus prior 1.2 x 0.7 x 1.1 cm. Right parotid 1 cm lesion does not have typical appearance of an intra parotid lymph node and therefore primary parotid lesion is a possibility. ENT consultation may be considered for further delineation.  10-08-14: mri of cervical; spine: Scattered new areas of altered signal intensity (compared to 2007) throughout the cervical cord as detailed above consistent with patient's history of multiple sclerosis. None of these areas demonstrate enhancement as may be seen with areas of active demyelination. Cervical spondylotic changes as detailed above most prominent C5-6 level.  01-12-15: 2-d echo: Left ventricle: The cavity size was normal. There was mild concentric hypertrophy. Systolic function was normal. The estimated ejection fraction was in the range of 60% to 65%. Wall motion was normal; there were no regional wall motion abnormalities. Doppler parameters are consistent with abnormal left ventricular relaxation (grade 1 diastolic dysfunction). - Aortic valve: There was mild regurgitation. - Mitral valve: Calcified annulus. There was mild regurgitation.  07-14-15: EEG: This EEG is consistent with a mild generalized non-specific cerebral dysfunction(encephalopathy).   07-20-15: chest x-ray; Interval extubation. Decreasing lung volumes with increasing bibasilar opacities, likely atelectasis.  11-07-15: ct of head; No acute intracranial abnormality. White matter changes are suggestive for chronic small vessel ischemic disease. Right maxillary sinus disease.   NO NEW EXAMS   LABS REVIEWED: PREVIOUS    03-03-16: wbc 5.3; hgb 10.0; hct 32.6; mcv 81.9; plt 242; glucose 149; bun 15.2; creat 1.26; k+ 3.4; na++ 143; ast 140; albumin 3.9; vit B 12: >2000 03-23-16: glucose 94; bun 18.5; creat 1.42; k+ 4.1; na++ 142  08-09-16: chol 159; ldl 109; trig 109; hdl 28  09-10-16: wbc 4.9; hgb 10.5; hct 33.2;  mcv 86.6; plt 253  glucose 164; bun 9.0; creat 1.12; k+ 3.8; na++ 14; liver normal albumin 3.8 chol 200; ldl 142; trig 144; hdl 29  11-20-16: urine culture: providencia stuartii: fosfomycin 3 gm 12-12-16: urine culture: providencia stuartii: rocephin  01-29-17: wbc 5.1; hgb 10.8; hct 32.1; mcv 85.2; plt 262; glucose 128; bun 17.1; creat 1.25; k+ 4.2; na++ 144; alk phos 138; albumin 3.9  02-27-17:wbc 6.5; hgb 9.5; hct 30.4; mcv 88.2; plt 254; glucose 123; bun 21; creat 1.40; k+ 4.8; na++ 141; ca 9.1; alk phos 137; albumin 3.6  NO NEW LABS    Review of Systems  Constitutional: Positive for fever and malaise/fatigue.  HENT: Positive for congestion and sore throat.   Respiratory: Positive for cough and sputum production.   Cardiovascular: Negative for chest pain, palpitations and leg swelling.  Gastrointestinal: Negative for abdominal pain, constipation and heartburn.  Musculoskeletal: Negative for back pain, joint pain and myalgias.  Skin: Negative.   Neurological: Positive for weakness. Negative for dizziness.  Psychiatric/Behavioral: The patient is not nervous/anxious.    Physical Exam  Constitutional: She is oriented to person, place, and time. She appears well-developed and well-nourished. No distress.  Obese   Neck: Neck supple. No thyromegaly present.  Cardiovascular: Normal rate, regular rhythm, normal heart sounds and intact distal pulses.   Pulmonary/Chest: Effort normal. No respiratory distress. She has wheezes. She has rales.  Abdominal: Soft. Bowel sounds are normal. She exhibits no distension. There is no tenderness.  Musculoskeletal:  Limited movement of upper extremities  Unable to move lower extremities Left shoulder pain with movement     Lymphadenopathy:    She has no cervical adenopathy.  Neurological: She is alert and oriented to  person, place, and time.  Skin: Skin is warm and dry. She is not diaphoretic.  Psychiatric: She has a normal mood and affect.      ASSESSMENT/ PLAN:  TODAY:   1. Influenza: will do a rapid swab; will being tamiflu 75 mg twice daily for 5 days; will begin duoneb every 6 hours for one week. Will get chest x-ray and cbc.   MD is aware of resident's narcotic use and is in agreement with current plan of care. We will attempt to wean resident as apropriate   Ok Edwards NP [facility]  Contact [phone] Monday through Friday 8am- 5pm  After hours call [phone]

## 2017-04-03 ENCOUNTER — Encounter: Payer: Self-pay | Admitting: Neurology

## 2017-04-06 DIAGNOSIS — H409 Unspecified glaucoma: Secondary | ICD-10-CM | POA: Insufficient documentation

## 2017-04-18 ENCOUNTER — Encounter: Payer: Self-pay | Admitting: Family Medicine

## 2017-04-19 ENCOUNTER — Other Ambulatory Visit: Payer: Self-pay | Admitting: Neurology

## 2017-04-19 DIAGNOSIS — J111 Influenza due to unidentified influenza virus with other respiratory manifestations: Secondary | ICD-10-CM | POA: Insufficient documentation

## 2017-04-30 ENCOUNTER — Other Ambulatory Visit: Payer: Self-pay

## 2017-04-30 MED ORDER — ZOLPIDEM TARTRATE 5 MG PO TABS: 5.0000 mg | ORAL_TABLET | Freq: Every day | ORAL | 0 refills | Status: DC

## 2017-04-30 NOTE — Telephone Encounter (Signed)
RX faxed to [facility] @ [phone], phone number [phone] 

## 2017-05-01 ENCOUNTER — Non-Acute Institutional Stay (SKILLED_NURSING_FACILITY): Payer: Medicare Other | Admitting: Adult Health

## 2017-05-01 ENCOUNTER — Encounter: Payer: Self-pay | Admitting: Adult Health

## 2017-05-01 DIAGNOSIS — G894 Chronic pain syndrome: Secondary | ICD-10-CM | POA: Diagnosis not present

## 2017-05-01 DIAGNOSIS — K5909 Other constipation: Secondary | ICD-10-CM

## 2017-05-01 DIAGNOSIS — G35 Multiple sclerosis: Secondary | ICD-10-CM

## 2017-05-01 DIAGNOSIS — I5042 Chronic combined systolic (congestive) and diastolic (congestive) heart failure: Secondary | ICD-10-CM | POA: Diagnosis not present

## 2017-05-01 NOTE — Progress Notes (Signed)
Location:   [place] Room Number: [place] of Service:  SNF (31)   CODE STATUS: DNR  Allergies  Allergen Reactions  . Sulfonamide Derivatives Hives and Shortness Of Breath  . Penicillins Hives and Itching    Has patient had a PCN reaction causing immediate rash, facial/tongue/throat swelling, SOB or lightheadedness with hypotension: unknown Has patient had a PCN reaction causing severe rash involving mucus membranes or skin necrosis: unknown Has patient had a PCN reaction that required hospitalization: unknown Has patient had a PCN reaction occurring within the last 10 years: unknown If all of the above answers are "NO", then may proceed with Cephalosporin use. Prior course of rocephin charted 05/2015    Chief Complaint  Patient presents with  . Medical Management of Chronic Issues    MS: chronic pain; heart failure; constipation     HPI:  She is a 56 year old long term resident of this facility being seen for the management of her chronic illnesses: chronic combined systolic and diastolic congestive heart failure; multiple sclerosis progressive/relapsing; chronic pain syndrome; chronic constipation. She denies any uncontrolled pain; no constipation; no urinary symptoms; no chest pain or shortness of breath. There are no nursing concerns at this time.   Past Medical History:  Diagnosis Date  . Cardiomyopathy ([place])    a.  Echo 04/29/12: Mild LVH, EF 20-25%, mild AI, moderate MR, moderate LAE, mild RAE, mild RVE, moderate TR, PASP 51, small pericardial effusion;   b. probably non-ischemic given multiple chemo-Tx agents used for MS and global LV dysfn on echo  . Chronic systolic heart failure ([place])   . Depression   . Glaucoma   . Hypertension   . MS (multiple sclerosis) ([place])    a. Dx'd late 20's. b. Tx with Novantrone, Tysabri, Copaxone previously.    Past Surgical History:  Procedure Laterality Date  . ABLATION     uterine  . CESAREAN SECTION       Social History   Social History  . Marital status: Single    Spouse name: N/A  . Number of children: N/A  . Years of education: N/A   Occupational History  . Not on file.   Social History Main Topics  . Smoking status: Former Smoker    Packs/day: 0.50    Years: 5.00  . Smokeless tobacco: Never Used  . Alcohol use No  . Drug use: No  . Sexual activity: Not Currently   Other Topics Concern  . Not on file   Social History Narrative  . No narrative on file   Family History  Problem Relation Age of Onset  . Hypertension Mother   . Cancer Mother        breast   . Cancer Father        prostate  . Multiple sclerosis Sister   . Heart attack Neg Hx       VITAL SIGNS BP 140/83   Pulse 82   Temp 98 F (36.7 C)   Resp 18   Ht 5' 5" (1.651 m)   Wt 239 lb 11.2 oz (108.7 kg)   SpO2 96%   BMI 39.89 kg/m   Patient's Medications  New Prescriptions   No medications on file  Previous Medications   ACETAMINOPHEN (TYLENOL) 325 MG TABLET    Take 650 mg by mouth 3 (three) times daily.   ALBUTEROL (PROVENTIL) (2.5 MG/3ML) 0.083% NEBULIZER SOLUTION    Take 3 mLs (2.5 mg total) by nebulization every  2 (two) hours as needed for wheezing.   AMANTADINE (SYMMETREL) 100 MG CAPSULE    Take 100 mg by mouth daily.    BACLOFEN (LIORESAL) 20 MG TABLET    Take 1 tablet (20 mg total) by mouth 3 (three) times daily as needed for muscle spasms.   CARVEDILOL (COREG) 25 MG TABLET    Take 1 tablet (25 mg total) by mouth 2 (two) times daily with a meal.   CHOLECALCIFEROL (VITAMIN D-3 PO)    Take 1,000 Int'l Units/day by mouth at bedtime.    CRANBERRY PO    Take 100 mg by mouth at bedtime.    DULOXETINE (CYMBALTA) 60 MG CAPSULE    Take 60 mg by mouth daily.   FAMOTIDINE (PEPCID) 20 MG TABLET    Take 1 tablet (20 mg total) by mouth 2 (two) times daily.   FLUTICASONE (FLONASE) 50 MCG/ACT NASAL SPRAY    Place 2 sprays into both nostrils every evening.   GABAPENTIN (NEURONTIN) 100 MG CAPSULE     Give 7 tablets (700 mg) by mouth two times daily and 3 tablets (300 mg) by mouth at bedtime   HYDROCORTISONE CREAM 0.5 %    Apply to cheeks topically two times a day for dry areas, avoid eyes   LACTASE (LACTAID PO)    Take 1 tablet by mouth at bedtime.    LACTULOSE 20 GM/30ML SOLN    Take 30 mLs by mouth daily as needed.    LATANOPROST (XALATAN) 0.005 % OPHTHALMIC SOLUTION    Place 1 drop into both eyes at bedtime.   LORATADINE (CLARITIN) 10 MG TABLET    Take 10 mg by mouth daily.   MAGNESIUM HYDROXIDE (MILK OF MAGNESIA) 400 MG/5ML SUSPENSION    Take 30 mLs by mouth every 8 (eight) hours as needed for mild constipation or moderate constipation.   MENTHOL, TOPICAL ANALGESIC, (BIOFREEZE) 4 % GEL    Apply to left shoulder topically four times a day for pain   MIRABEGRON ER (MYRBETRIQ) 50 MG TB24 TABLET    Take 50 mg by mouth at bedtime.    PHENAZOPYRIDINE (PYRIDIUM) 100 MG TABLET    Take 100 mg by mouth every 8 (eight) hours as needed for pain.   POTASSIUM CHLORIDE (KLOR-CON) 20 MEQ PACKET    Take 20 mEq by mouth daily.    PROPYLENE GLYCOL (SYSTANE BALANCE) 0.6 % SOLN    Instill 1 drop in both eyes two times a day for dry eyes   SENNA-DOCUSATE (SENOKOT S) 8.6-50 MG TABLET    Take 2 tablets by mouth 2 (two) times daily.    SIMETHICONE (MYLICON) 80 MG CHEWABLE TABLET    Chew 80 mg by mouth every 6 (six) hours as needed for flatulence.   SPIRONOLACTONE (ALDACTONE) 25 MG TABLET    Take 25 mg by mouth daily. For CHF   TAMSULOSIN (FLOMAX) 0.4 MG CAPS CAPSULE    Take 0.4 mg by mouth daily. For incontinence   TORSEMIDE (DEMADEX) 20 MG TABLET    Take 20 mg by mouth daily.   ZOLPIDEM (AMBIEN) 5 MG TABLET    Take 1 tablet (5 mg total) by mouth at bedtime.  Modified Medications   No medications on file  Discontinued Medications   No medications on file     SIGNIFICANT DIAGNOSTIC EXAMS  PREVIOUS   10-08-14: MRI brain: No significant change in diffuse white matter lesions consistent with patient's history  of multiple sclerosis. None of these areas demonstrate abnormal restriction or  enhancement as may be seen with areas of acute demyelination. Global atrophy. 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 Right parotid 1 cm lesion does not have typical appearance of an intra parotid lymph node and therefore primary parotid lesion is a possibility. ENT consultation may be considered for further delineation.  10-08-14: mri of cervical; spine: Scattered new areas of altered signal intensity (compared to 2007) throughout the cervical cord as detailed above consistent with patient's history of multiple sclerosis. None of these areas demonstrate enhancement as may be seen with areas of active demyelination. Cervical spondylotic changes as detailed above most prominent C5-6 level.  01-12-15: 2-d echo: Left ventricle: The cavity size was normal. There was mild concentric hypertrophy. Systolic function was normal. The estimated ejection fraction was in the range of 60% to 65%. Wall motion was normal; there were no regional wall motion abnormalities. Doppler parameters are consistent with abnormal left ventricular relaxation (grade 1 diastolic dysfunction). - Aortic valve: There was mild regurgitation. - Mitral valve: Calcified annulus. There was mild regurgitation.  07-14-15: EEG: This EEG is consistent with a mild generalized non-specific cerebral dysfunction(encephalopathy).   07-20-15: chest x-ray; Interval extubation. Decreasing lung volumes with increasing bibasilar opacities, likely atelectasis.  11-07-15: ct of head; No acute intracranial abnormality. White matter changes are suggestive for chronic small vessel ischemic disease. Right maxillary sinus disease.   NO NEW EXAMS   LABS REVIEWED: PREVIOUS    08-09-16: chol 159; ldl 109; trig 109; hdl 28  09-10-16: wbc 4.9; hgb 10.5; hct 33.2; mcv 86.6; plt 253  glucose 164; bun 9.0; creat 1.12; k+ 3.8;  na++ 14; liver normal albumin 3.8 chol 200; ldl 142; trig 144; hdl 29  11-20-16: urine culture: providencia stuartii: fosfomycin 3 gm 12-12-16: urine culture: providencia stuartii: rocephin  01-29-17: wbc 5.1; hgb 10.8; hct 32.1; mcv 85.2; plt 262; glucose 128; bun 17.1; creat 1.25; k+ 4.2; na++ 144; alk phos 138; albumin 3.9  02-27-17:wbc 6.5; hgb 9.5; hct 30.4; mcv 88.2; plt 254; glucose 123; bun 21; creat 1.40; k+ 4.8; na++ 141; ca 9.1; alk phos 137; albumin 3.6  TODAY:   04-02-17: wbc 7.2; hgb 11.7; hct 37.8; mv 88.7; plt 285; flu: A/B: neg     Review of Systems  Constitutional: Positive for malaise/fatigue.  Respiratory: Negative for cough and shortness of breath.   Cardiovascular: Negative for chest pain, palpitations and leg swelling.  Gastrointestinal: Negative for abdominal pain, constipation and heartburn.  Musculoskeletal: Negative for back pain, joint pain and myalgias.  Skin: Negative.   Neurological: Negative for dizziness.  Psychiatric/Behavioral: The patient is not nervous/anxious.    Physical Exam  Constitutional: She is oriented to person, place, and time. She appears well-developed and well-nourished. No distress.  Obese   Neck: Neck supple. No thyromegaly present.  Cardiovascular: Normal rate, regular rhythm, normal heart sounds and intact distal pulses.  Pulmonary/Chest: Effort normal and breath sounds normal. No respiratory distress.  Abdominal: Soft. Bowel sounds are normal. She exhibits no distension.  Musculoskeletal: She exhibits no edema.  Limited movement of upper extremities  Unable to move lower extremities   Lymphadenopathy:    She has no cervical adenopathy.  Neurological: She is alert and oriented to person, place, and time.  Skin: Skin is warm and dry. She is not diaphoretic.  Psychiatric: She has a normal mood and affect.    ASSESSMENT/ PLAN:  TODAY:   1. MS: is  slowly declining   Has been  followed by  neurology;  Has paraplegia.will continue  amantadine 100 mg daily baclofen 20 mg tid prn  for muscle spasms;   Saw neurology on 09-26-16   2. Chronic pain: is stable:   will continue  neurontin 700 mg twice  daily; takes cymbalta 60 mg daily   tylenol 650 mg three times daily routinely  3. Chronic combined systolic and diastolic heart failure:EF is 60-65%(01-12-15)    is stable will continue demadex 20  mg daily; with k+ 20 meq daily; will continue aldactone 25 mg daily;    4. Constipation:  Is stable; will continue senna s twice daily and lactulose  30 cc daily and will monitor   PREVIOUS    5. Depression: is presently stable  taking cymbalta 60 mg daily; which also helps with her pain management.  ambien 5  mg nightly   6. Hypertension b/p140/83 : is stable  will continue  coreg 25 mg twice daily and will monitor   7. Neurogenic bladder: no change in status  takes flomax 0.4 mg daily  myrbetriq 50 mg daily   I/O cath every 6 hours as needed  8. Jerrye Bushy: is stable  will continue pepcid 20 mg twice daily   9. Glaucoma:  Is stable will continue xalatan to both eyes.     MD is aware of resident's narcotic use and is in agreement with current plan of care. We will attempt to wean resident as apropriate     Ok Edwards NP [facility]  Contact [phone] Monday through Friday 8am- 5pm  After hours call [phone]

## 2017-05-06 ENCOUNTER — Non-Acute Institutional Stay (SKILLED_NURSING_FACILITY): Payer: Medicare Other | Admitting: Adult Health

## 2017-05-06 ENCOUNTER — Encounter: Payer: Self-pay | Admitting: Adult Health

## 2017-05-06 DIAGNOSIS — J0110 Acute frontal sinusitis, unspecified: Secondary | ICD-10-CM

## 2017-05-06 NOTE — Progress Notes (Signed)
Location:   [place] Room Number: [place] of Service:  SNF (31)   CODE STATUS: DNR  Allergies  Allergen Reactions  . Sulfonamide Derivatives Hives and Shortness Of Breath  . Penicillins Hives and Itching    Has patient had a PCN reaction causing immediate rash, facial/tongue/throat swelling, SOB or lightheadedness with hypotension: unknown Has patient had a PCN reaction causing severe rash involving mucus membranes or skin necrosis: unknown Has patient had a PCN reaction that required hospitalization: unknown Has patient had a PCN reaction occurring within the last 10 years: unknown If all of the above answers are "NO", then may proceed with Cephalosporin use. Prior course of rocephin charted 05/2015    Chief Complaint  Patient presents with  . Acute Visit    Sinusitis    HPI:  She is complaining of increased sinus pressure with Shannon Garrett nasal drainage present. She does not have any body aches; no sore throat; there are no reports of fever present. She is concerned that she has a sinus infection.   Past Medical History:  Diagnosis Date  . Cardiomyopathy ([place])    a.  Echo 04/29/12: Mild LVH, EF 20-25%, mild AI, moderate MR, moderate LAE, mild RAE, mild RVE, moderate TR, PASP 51, small pericardial effusion;   b. probably non-ischemic given multiple chemo-Tx agents used for MS and global LV dysfn on echo  . Chronic systolic heart failure ([place])   . Depression   . Glaucoma   . Hypertension   . MS (multiple sclerosis) ([place])    a. Dx'd late 20's. b. Tx with Novantrone, Tysabri, Copaxone previously.    Past Surgical History:  Procedure Laterality Date  . ABLATION     uterine  . CESAREAN SECTION      Social History   Socioeconomic History  . Marital status: Single    Spouse name: Not on file  . Number of children: Not on file  . Years of education: Not on file  . Highest education level: Not on file  Social Needs  . Financial resource strain: Not on  file  . Food insecurity - worry: Not on file  . Food insecurity - inability: Not on file  . Transportation needs - medical: Not on file  . Transportation needs - non-medical: Not on file  Occupational History  . Not on file  Tobacco Use  . Smoking status: Former Smoker    Packs/day: 0.50    Years: 5.00    Pack years: 2.50  . Smokeless tobacco: Never Used  Substance and Sexual Activity  . Alcohol use: No  . Drug use: No  . Sexual activity: Not Currently  Other Topics Concern  . Not on file  Social History Narrative  . Not on file   Family History  Problem Relation Age of Onset  . Hypertension Mother   . Cancer Mother        breast   . Cancer Father        prostate  . Multiple sclerosis Sister   . Heart attack Neg Hx       VITAL SIGNS BP 105/77   Pulse 77   Temp 97.9 F (36.6 C)   Resp 18   Ht '5\' 5"'$  (1.651 m)   Wt 239 lb 11.2 oz (108.7 kg)   SpO2 95%   BMI 39.89 kg/m     Medication List        Accurate as of 05/06/17  3:22 PM. Always use your most  recent med list.          acetaminophen 325 MG tablet Commonly known as:  TYLENOL   albuterol (2.5 MG/3ML) 0.083% nebulizer solution Commonly known as:  PROVENTIL Take 3 mLs (2.5 mg total) by nebulization every 2 (two) hours as needed for wheezing.   amantadine 100 MG capsule Commonly known as:  SYMMETREL   baclofen 20 MG tablet Commonly known as:  LIORESAL Take 1 tablet (20 mg total) by mouth 3 (three) times daily as needed for muscle spasms.   BIOFREEZE 4 % Gel Generic drug:  Menthol (Topical Analgesic)   carvedilol 25 MG tablet Commonly known as:  COREG Take 1 tablet (25 mg total) by mouth 2 (two) times daily with a meal.   CRANBERRY PO   DULoxetine 60 MG capsule Commonly known as:  CYMBALTA   famotidine 20 MG tablet Commonly known as:  PEPCID Take 1 tablet (20 mg total) by mouth 2 (two) times daily.   fluticasone 50 MCG/ACT nasal spray Commonly known as:  FLONASE   gabapentin 100 MG  capsule Commonly known as:  NEURONTIN   hydrocortisone cream 0.5 %   LACTAID PO   Lactulose 20 GM/30ML Soln   latanoprost 0.005 % ophthalmic solution Commonly known as:  XALATAN   loratadine 10 MG tablet Commonly known as:  CLARITIN   magnesium hydroxide 400 MG/5ML suspension Commonly known as:  MILK OF MAGNESIA   mirabegron ER 50 MG Tb24 tablet Commonly known as:  MYRBETRIQ   phenazopyridine 100 MG tablet Commonly known as:  PYRIDIUM   potassium chloride 20 MEQ packet Commonly known as:  KLOR-CON   SENOKOT S 8.6-50 MG tablet Generic drug:  senna-docusate   simethicone 80 MG chewable tablet Commonly known as:  MYLICON   spironolactone 25 MG tablet Commonly known as:  ALDACTONE   SYSTANE BALANCE 0.6 % Soln Generic drug:  Propylene Glycol   tamsulosin 0.4 MG Caps capsule Commonly known as:  FLOMAX   torsemide 20 MG tablet Commonly known as:  DEMADEX   VITAMIN D-3 PO   zolpidem 5 MG tablet Commonly known as:  AMBIEN Take 1 tablet (5 mg total) by mouth at bedtime.        SIGNIFICANT DIAGNOSTIC EXAMS    PREVIOUS   10-08-14: MRI brain: No significant change in diffuse white matter lesions consistent with patient's history of multiple sclerosis. None of these areas demonstrate abnormal restriction or enhancement as may be seen with areas of acute demyelination. Global atrophy. 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 Right parotid 1 cm lesion does not have typical appearance of an intra parotid lymph node and therefore primary parotid lesion is a possibility. ENT consultation may be considered for further delineation.  10-08-14: mri of cervical; spine: Scattered new areas of altered signal intensity (compared to 2007) throughout the cervical cord as detailed above consistent with patient's history of multiple sclerosis. None of these areas demonstrate enhancement as may be seen with areas of active  demyelination. Cervical spondylotic changes as detailed above most prominent C5-6 level.  01-12-15: 2-d echo: Left ventricle: The cavity size was normal. There was mild concentric hypertrophy. Systolic function was normal. The estimated ejection fraction was in the range of 60% to 65%. Wall motion was normal; there were no regional wall motion abnormalities. Doppler parameters are consistent with abnormal left ventricular relaxation (grade 1 diastolic dysfunction). - Aortic valve: There was mild regurgitation. -  Mitral valve: Calcified annulus. There was mild regurgitation.  07-14-15: EEG: This EEG is consistent with a mild generalized non-specific cerebral dysfunction(encephalopathy).   07-20-15: chest x-ray; Interval extubation. Decreasing lung volumes with increasing bibasilar opacities, likely atelectasis.  11-07-15: ct of head; No acute intracranial abnormality. White matter changes are suggestive for chronic small vessel ischemic disease. Right maxillary sinus disease.   NO NEW EXAMS   LABS REVIEWED: PREVIOUS    08-09-16: chol 159; ldl 109; trig 109; hdl 28  09-10-16: wbc 4.9; hgb 10.5; hct 33.2; mcv 86.6; plt 253  glucose 164; bun 9.0; creat 1.12; k+ 3.8; na++ 14; liver normal albumin 3.8 chol 200; ldl 142; trig 144; hdl 29  11-20-16: urine culture: providencia stuartii: fosfomycin 3 gm 12-12-16: urine culture: providencia stuartii: rocephin  01-29-17: wbc 5.1; hgb 10.8; hct 32.1; mcv 85.2; plt 262; glucose 128; bun 17.1; creat 1.25; k+ 4.2; na++ 144; alk phos 138; albumin 3.9  02-27-17:wbc 6.5; hgb 9.5; hct 30.4; mcv 88.2; plt 254; glucose 123; bun 21; creat 1.40; k+ 4.8; na++ 141; ca 9.1; alk phos 137; albumin 3.6 04-02-17: wbc 7.2; hgb 11.7; hct 37.8; mv 88.7; plt 285; flu: A/B: neg     NO NEW LABS   Review of Systems  Constitutional: Negative for malaise/fatigue.  HENT: Positive for congestion and sinus pain. Negative for ear pain and sore throat.        Has Shannon Garrett nasal drainage     Respiratory: Negative for cough and shortness of breath.   Cardiovascular: Negative for chest pain, palpitations and leg swelling.  Gastrointestinal: Negative for abdominal pain, constipation and heartburn.  Musculoskeletal: Negative for back pain, joint pain and myalgias.  Skin: Negative.   Neurological: Negative for dizziness.  Psychiatric/Behavioral: The patient is not nervous/anxious.     Physical Exam  Constitutional: She is oriented to person, place, and time. She appears well-developed and well-nourished. No distress.  Obese   HENT:  Has facial tenderness Has Shannon Garrett nasal drainage Has mild facial swelling present   Eyes: Conjunctivae are normal.  Neck: Neck supple. No thyromegaly present.  Cardiovascular: Normal rate, regular rhythm, normal heart sounds and intact distal pulses.  Pulmonary/Chest: Effort normal and breath sounds normal. No respiratory distress.  Abdominal: Soft. Bowel sounds are normal. She exhibits no distension. There is no tenderness.  Musculoskeletal: She exhibits no edema.  Limited movement of upper extremities  Unable to move lower extremities  Lymphadenopathy:    She has no cervical adenopathy.  Neurological: She is oriented to person, place, and time.  Skin: Skin is warm and dry. She is not diaphoretic.  Psychiatric: She has a normal mood and affect.   ASSESSMENT/ PLAN:  1. Acute frontal sinusitis: will use saline nasal spray to clean sinuses will begin mucinex twice daily for 10 days and will begin zithromax 500 mg daily for 7 days will monitor    MD is aware of resident's narcotic use and is in agreement with current plan of care. We will attempt to wean resident as apropriate   Ok Edwards NP [facility]  Contact [phone] Monday through Friday 8am- 5pm  After hours call [phone]

## 2017-05-09 ENCOUNTER — Other Ambulatory Visit: Payer: Self-pay

## 2017-05-09 MED ORDER — ZOLPIDEM TARTRATE 5 MG PO TABS: 5.0000 mg | ORAL_TABLET | Freq: Every day | ORAL | 0 refills | Status: DC

## 2017-05-09 NOTE — Telephone Encounter (Signed)
RX faxed to [facility] @ [phone], phone number [phone] 

## 2017-05-13 ENCOUNTER — Encounter: Payer: Self-pay | Admitting: Gastroenterology

## 2017-05-13 ENCOUNTER — Ambulatory Visit (INDEPENDENT_AMBULATORY_CARE_PROVIDER_SITE_OTHER): Payer: Medicare Other | Admitting: Gastroenterology

## 2017-05-13 DIAGNOSIS — K5900 Constipation, unspecified: Secondary | ICD-10-CM | POA: Diagnosis not present

## 2017-05-13 DIAGNOSIS — D649 Anemia, unspecified: Secondary | ICD-10-CM | POA: Diagnosis not present

## 2017-05-13 NOTE — Patient Instructions (Signed)
Take your Lactulose daily, not as needed.    You are scheduled for a Barium enema x-ray examination on 05/22/17 at [facility].   Be prepared to spend 2 to 2 1/2 hours at the Radiology Department  It is very important that you follow the instructions below.  Failure to follow these instructions may result in a study that is less than optimal and may lead to the necessity of repeating the examination.   Barium Enema Prep  Please purchase the following items from the laxative section of your drug store 10 oz.  Magnesium Citrate Bottle 3 Bisacodyl Tablets-Enteric coated 1 Bisacodyl Suppository   Day before exam  12:00 Lunch. This meal may include clear broth, white chicken meat sandwich (no butter, lettuce or other additives), or two hard boiled eggs, strained fruit juices, jello or gelatin ( not containing fruits or nuts).  Coffee or tea (without milk or cream), carbonated beverages.  1:00 pm Drink one full glass or more of water   3:00 pm Drink one full glass or more of water  5:00 pm  Supper.  It is very important that supper be limited to the items described here Broth, Clear jello/gelatin with no whole foods added (no red), soft drinks, gatorade, water black coffee or tea (no milk or cream), popsicles.   No red jello or popsicles.   7:00 pm drink one full glass of water  8:00 pm Drink the bottle of Magnesium Citrate (drink cold if preferred)  10:00 pm  Take three (3) Bisacodyl tablets with one full glass or more of water.   Day of exam Nothing to eat or drink after midnight. 7:00 am unwrap the foil wrapper from bisacodyl suppository and discard the foil.  While lying on your side with thigh elevated, insert the suppository into the rectum and gently push in as far as possible.  Retain the suppository for at least 15 minutes, if possible, before evacuating, even if the urge is strong. Bowel evacuation usually occurs within 15 to 60 minutes.  Patients requiring  assistance should have a bed pan, commode or help readily available.   Report to the radiology department at [facility] on 05/22/17 at9:30 am. Enter the hospital through the Outpatient Entrance on the [facility] side of the hospital

## 2017-05-13 NOTE — Progress Notes (Signed)
History of Present Illness: This is a 56 year old female referred by Gildardo Cranker, DO for the evaluation of constipation.  She has several serious comorbidities including multiple sclerosis, cardiomyopathy and chronic systolic heart failure.  She is a resident of [facility] and she is brought to our facility by EMS on a stretcher. She is DNR. Recent Hb=9.5 with a baseline Hb=10.5 over past year.  She relates worsening problems with constipation over the past few years.  Lactulose is effective and she takes as needed if no bowel movement for 5-7 days.  In between bowel movements she feels bloated and distended.  She would like to have a bowel movement more regularly. Denies weight loss, abdominal pain, diarrhea, change in stool caliber, melena, hematochezia, nausea, vomiting, dysphagia, reflux symptoms, chest pain.    Allergies  Allergen Reactions  . Sulfonamide Derivatives Hives and Shortness Of Breath  . Penicillins Hives and Itching    Has patient had a PCN reaction causing immediate rash, facial/tongue/throat swelling, SOB or lightheadedness with hypotension: unknown Has patient had a PCN reaction causing severe rash involving mucus membranes or skin necrosis: unknown Has patient had a PCN reaction that required hospitalization: unknown Has patient had a PCN reaction occurring within the last 10 years: unknown If all of the above answers are "NO", then may proceed with Cephalosporin use. Prior course of rocephin charted 05/2015   Outpatient Medications Prior to Visit  Medication Sig Dispense Refill  . acetaminophen (TYLENOL) 325 MG tablet Take 650 mg by mouth 3 (three) times daily.    Marland Kitchen albuterol (PROVENTIL) (2.5 MG/3ML) 0.083% nebulizer solution Take 3 mLs (2.5 mg total) by nebulization every 2 (two) hours as needed for wheezing. 75 mL 12  . amantadine (SYMMETREL) 100 MG capsule Take 100 mg by mouth daily.     . baclofen (LIORESAL) 20 MG tablet Take 1 tablet (20 mg total)  by mouth 3 (three) times daily as needed for muscle spasms. 30 each 0  . carvedilol (COREG) 25 MG tablet Take 1 tablet (25 mg total) by mouth 2 (two) times daily with a meal.    . Cholecalciferol (VITAMIN D-3 PO) Take 1,000 Int'l Units/day by mouth at bedtime.     Marland Kitchen CRANBERRY PO Take 100 mg by mouth at bedtime.     . DULoxetine (CYMBALTA) 60 MG capsule Take 60 mg by mouth daily.    . famotidine (PEPCID) 20 MG tablet Take 1 tablet (20 mg total) by mouth 2 (two) times daily.    . fluticasone (FLONASE) 50 MCG/ACT nasal spray Place 2 sprays into both nostrils every evening.    . gabapentin (NEURONTIN) 100 MG capsule Give 7 tablets (700 mg) by mouth two times daily and 3 tablets (300 mg) by mouth at bedtime    . hydrocortisone cream 0.5 % Apply to cheeks topically two times a day for dry areas, avoid eyes    . Lactase (LACTAID PO) Take 1 tablet by mouth at bedtime.     . Lactulose 20 GM/30ML SOLN Take 30 mLs by mouth daily as needed.     . latanoprost (XALATAN) 0.005 % ophthalmic solution Place 1 drop into both eyes at bedtime.    Marland Kitchen loratadine (CLARITIN) 10 MG tablet Take 10 mg by mouth daily.    . magnesium hydroxide (MILK OF MAGNESIA) 400 MG/5ML suspension Take 30 mLs by mouth every 8 (eight) hours as needed for mild constipation or moderate constipation.    . Menthol, Topical Analgesic, (BIOFREEZE)  4 % GEL Apply to left shoulder topically four times a day for pain    . mirabegron ER (MYRBETRIQ) 50 MG TB24 tablet Take 50 mg by mouth at bedtime.     . phenazopyridine (PYRIDIUM) 100 MG tablet Take 100 mg by mouth every 8 (eight) hours as needed for pain.    . potassium chloride (KLOR-CON) 20 MEQ packet Take 20 mEq by mouth daily.     Marland Kitchen Propylene Glycol (SYSTANE BALANCE) 0.6 % SOLN Instill 1 drop in both eyes two times a day for dry eyes    . senna-docusate (SENOKOT S) 8.6-50 MG tablet Take 2 tablets by mouth 2 (two) times daily.     . simethicone (MYLICON) 80 MG chewable tablet Chew 80 mg by mouth  every 6 (six) hours as needed for flatulence.    Marland Kitchen spironolactone (ALDACTONE) 25 MG tablet Take 25 mg by mouth daily. For CHF    . torsemide (DEMADEX) 20 MG tablet Take 20 mg by mouth daily.    . tamsulosin (FLOMAX) 0.4 MG CAPS capsule Take 0.4 mg by mouth daily. For incontinence    . zolpidem (AMBIEN) 5 MG tablet Take 1 tablet (5 mg total) at bedtime by mouth. 30 tablet 0   No facility-administered medications prior to visit.    Past Medical History:  Diagnosis Date  . Cardiomyopathy ([place])    a.  Echo 04/29/12: Mild LVH, EF 20-25%, mild AI, moderate MR, moderate LAE, mild RAE, mild RVE, moderate TR, PASP 51, small pericardial effusion;   b. probably non-ischemic given multiple chemo-Tx agents used for MS and global LV dysfn on echo  . Chronic systolic heart failure ([place])   . Depression   . Glaucoma   . Hypertension   . MS (multiple sclerosis) ([place])    a. Dx'd late 20's. b. Tx with Novantrone, Tysabri, Copaxone previously.   Past Surgical History:  Procedure Laterality Date  . ABLATION     uterine  . CESAREAN SECTION     Social History   Socioeconomic History  . Marital status: Single    Spouse name: None  . Number of children: None  . Years of education: None  . Highest education level: None  Social Needs  . Financial resource strain: None  . Food insecurity - worry: None  . Food insecurity - inability: None  . Transportation needs - medical: None  . Transportation needs - non-medical: None  Occupational History  . None  Tobacco Use  . Smoking status: Former Smoker    Packs/day: 0.50    Years: 5.00    Pack years: 2.50  . Smokeless tobacco: Never Used  Substance and Sexual Activity  . Alcohol use: No  . Drug use: No  . Sexual activity: Not Currently  Other Topics Concern  . None  Social History Narrative  . None   Family History  Problem Relation Age of Onset  . Hypertension Mother   . Cancer Mother        breast   . Cancer Father        prostate  .  Multiple sclerosis Sister   . Heart attack Neg Hx       Review of Systems: Pertinent positive and negative review of systems were noted in the above HPI section. All other review of systems were otherwise negative.   Physical Exam: General: Well developed, well nourished, no acute distress Head: Normocephalic and atraumatic Eyes:  sclerae anicteric, EOMI Ears: Normal auditory acuity Mouth: No deformity  or lesions Neck: Supple, no masses or thyromegaly Lungs: Clear throughout to auscultation Heart: Regular rate and rhythm; no murmurs, rubs or bruits Abdomen: Soft, non tender and non distended. No masses, hepatosplenomegaly or hernias noted. Normal Bowel sounds Rectal: Unable to perform as patient is not ambulatory and is on a very narrow stretcher - unsafe to turn her on her side Musculoskeletal: Symmetrical with no gross deformities  Skin: No lesions on visible extremities Pulses:  Normal pulses noted Extremities: No clubbing, cyanosis, edema or deformities noted Neurological: Unable to move lower extremities, alert oriented x 4 Cervical Nodes:  No significant cervical adenopathy Inguinal Nodes: No significant inguinal adenopathy Psychological:  Alert and cooperative. Normal mood and affect  Assessment and Recommendations:  1. Constipation.  Anemia, unspecified. Continue Senokot 2 twice daily.  Increase frequency of lactulose to every day or every other day for a complete bowel movement daily/every other day.  MOM daily as needed if no bowel movement daily/every other day with lactulose.  We discussed colonoscopy to further evaluate however she is at a higher risk for colonoscopy and sedation due to her MS, cardiomyopathy and systolic heart failure.  After further discussion she was agreeable to proceed with an air-contrast barium enema which will avoid the risk of anesthesia.   cc: Gildardo Cranker, DO [place], [place] [id number]

## 2017-05-20 DIAGNOSIS — N390 Urinary tract infection, site not specified: Secondary | ICD-10-CM | POA: Diagnosis not present

## 2017-05-20 DIAGNOSIS — R319 Hematuria, unspecified: Secondary | ICD-10-CM | POA: Diagnosis not present

## 2017-05-20 DIAGNOSIS — Z79899 Other long term (current) drug therapy: Secondary | ICD-10-CM | POA: Diagnosis not present

## 2017-05-20 DIAGNOSIS — I10 Essential (primary) hypertension: Secondary | ICD-10-CM | POA: Diagnosis not present

## 2017-05-22 ENCOUNTER — Ambulatory Visit: Payer: Self-pay | Admitting: Obstetrics & Gynecology

## 2017-05-22 ENCOUNTER — Ambulatory Visit (HOSPITAL_COMMUNITY): Payer: Medicare Other

## 2017-05-24 DIAGNOSIS — J0110 Acute frontal sinusitis, unspecified: Secondary | ICD-10-CM | POA: Insufficient documentation

## 2017-05-29 ENCOUNTER — Ambulatory Visit: Payer: Self-pay | Admitting: Obstetrics & Gynecology

## 2017-05-30 ENCOUNTER — Encounter: Payer: Self-pay | Admitting: Adult Health

## 2017-05-30 ENCOUNTER — Non-Acute Institutional Stay (SKILLED_NURSING_FACILITY): Payer: Medicare Other | Admitting: Adult Health

## 2017-05-30 DIAGNOSIS — F329 Major depressive disorder, single episode, unspecified: Secondary | ICD-10-CM | POA: Diagnosis not present

## 2017-05-30 DIAGNOSIS — K219 Gastro-esophageal reflux disease without esophagitis: Secondary | ICD-10-CM

## 2017-05-30 DIAGNOSIS — I110 Hypertensive heart disease with heart failure: Secondary | ICD-10-CM | POA: Diagnosis not present

## 2017-05-30 DIAGNOSIS — N319 Neuromuscular dysfunction of bladder, unspecified: Secondary | ICD-10-CM

## 2017-05-30 DIAGNOSIS — F32A Depression, unspecified: Secondary | ICD-10-CM

## 2017-05-30 DIAGNOSIS — I5042 Chronic combined systolic (congestive) and diastolic (congestive) heart failure: Secondary | ICD-10-CM

## 2017-05-30 NOTE — Progress Notes (Signed)
Location:   [facility] Room Number: [place] of Service:  SNF (31)   CODE STATUS: DNR  Allergies  Allergen Reactions  . Sulfonamide Derivatives Hives and Shortness Of Breath  . Penicillins Hives and Itching    Has patient had a PCN reaction causing immediate rash, facial/tongue/throat swelling, SOB or lightheadedness with hypotension: unknown Has patient had a PCN reaction causing severe rash involving mucus membranes or skin necrosis: unknown Has patient had a PCN reaction that required hospitalization: unknown Has patient had a PCN reaction occurring within the last 10 years: unknown If all of the above answers are "NO", then may proceed with Cephalosporin use. Prior course of rocephin charted 05/2015    Chief Complaint  Patient presents with  . Medical Management of Chronic Issues    Depression; hypertension; neurogenic bladder; gerd    HPI:  She is a 56 year old long term resident of this facility being seen for the management of her chronic illnesses: hypertensive heart disease with chf; gerd without esophagitis; depression; and neurogenic bladder. She denies any urine retention and has not required I/o cath. She denies feelings of depression present. Denies heart burn; headaches. There are no nursing concerns at this time.   Past Medical History:  Diagnosis Date  . Cardiomyopathy ([place])    a.  Echo 04/29/12: Mild LVH, EF 20-25%, mild AI, moderate MR, moderate LAE, mild RAE, mild RVE, moderate TR, PASP 51, small pericardial effusion;   b. probably non-ischemic given multiple chemo-Tx agents used for MS and global LV dysfn on echo  . Chronic systolic heart failure ([place])   . Depression   . Glaucoma   . Hypertension   . MS (multiple sclerosis) ([place])    a. Dx'd late 20's. b. Tx with Novantrone, Tysabri, Copaxone previously.    Past Surgical History:  Procedure Laterality Date  . ABLATION     uterine  . CESAREAN SECTION      Social History    Socioeconomic History  . Marital status: Single    Spouse name: Not on file  . Number of children: Not on file  . Years of education: Not on file  . Highest education level: Not on file  Social Needs  . Financial resource strain: Not on file  . Food insecurity - worry: Not on file  . Food insecurity - inability: Not on file  . Transportation needs - medical: Not on file  . Transportation needs - non-medical: Not on file  Occupational History  . Not on file  Tobacco Use  . Smoking status: Former Smoker    Packs/day: 0.50    Years: 5.00    Pack years: 2.50  . Smokeless tobacco: Never Used  Substance and Sexual Activity  . Alcohol use: No  . Drug use: No  . Sexual activity: Not Currently  Other Topics Concern  . Not on file  Social History Narrative  . Not on file   Family History  Problem Relation Age of Onset  . Hypertension Mother   . Cancer Mother        breast   . Cancer Father        prostate  . Multiple sclerosis Sister   . Heart attack Neg Hx       VITAL SIGNS BP 135/82   Pulse 80   Temp 97.6 F (36.4 C)   Resp 18   Ht 5' 5" (1.651 m)   Wt 240 lb 8 oz (109.1 kg)  BMI 40.02 kg/m   Outpatient Encounter Medications as of 05/30/2017  Medication Sig  . acetaminophen (TYLENOL) 325 MG tablet Take 650 mg by mouth 3 (three) times daily.  Marland Kitchen albuterol (PROVENTIL) (2.5 MG/3ML) 0.083% nebulizer solution Take 3 mLs (2.5 mg total) by nebulization every 2 (two) hours as needed for wheezing.  Marland Kitchen amantadine (SYMMETREL) 100 MG capsule Take 100 mg by mouth daily.   . baclofen (LIORESAL) 20 MG tablet Take 1 tablet (20 mg total) by mouth 3 (three) times daily as needed for muscle spasms.  . carvedilol (COREG) 25 MG tablet Take 1 tablet (25 mg total) by mouth 2 (two) times daily with a meal.  . Cholecalciferol (VITAMIN D-3 PO) Take 1,000 Int'l Units/day by mouth at bedtime.   . ciprofloxacin (CIPRO) 500 MG tablet Take 500 mg by mouth 2 (two) times daily.  Marland Kitchen CRANBERRY  PO Take 100 mg by mouth at bedtime.   . DULoxetine (CYMBALTA) 60 MG capsule Take 60 mg by mouth daily.  . famotidine (PEPCID) 20 MG tablet Take 1 tablet (20 mg total) by mouth 2 (two) times daily.  . fluticasone (FLONASE) 50 MCG/ACT nasal spray Place 2 sprays into both nostrils every evening.  . gabapentin (NEURONTIN) 100 MG capsule Give 7 tablets (700 mg) by mouth two times daily and 3 tablets (300 mg) by mouth at bedtime  . hydrocortisone cream 0.5 % Apply to cheeks topically two times a day for dry areas, avoid eyes  . Lactase (LACTAID PO) Take 1 tablet by mouth at bedtime.   . Lactulose 20 GM/30ML SOLN Take 30 mLs by mouth daily as needed.   . latanoprost (XALATAN) 0.005 % ophthalmic solution Place 1 drop into both eyes at bedtime.  Marland Kitchen loratadine (CLARITIN) 10 MG tablet Take 10 mg by mouth daily.  . magnesium hydroxide (MILK OF MAGNESIA) 400 MG/5ML suspension Take 30 mLs by mouth every 8 (eight) hours as needed for mild constipation or moderate constipation.  . Menthol, Topical Analgesic, (BIOFREEZE) 4 % GEL Apply to left shoulder topically four times a day for pain  . mirabegron ER (MYRBETRIQ) 50 MG TB24 tablet Take 50 mg by mouth at bedtime.   . phenazopyridine (PYRIDIUM) 100 MG tablet Take 100 mg by mouth every 8 (eight) hours as needed for pain.  . potassium chloride (KLOR-CON) 20 MEQ packet Take 20 mEq by mouth daily.   . Probiotic CAPS Take 1 capsule by mouth 2 (two) times daily.  Marland Kitchen Propylene Glycol (SYSTANE BALANCE) 0.6 % SOLN Instill 1 drop in both eyes two times a day for dry eyes  . senna-docusate (SENOKOT S) 8.6-50 MG tablet Take 2 tablets by mouth 2 (two) times daily.   . simethicone (MYLICON) 80 MG chewable tablet Chew 80 mg by mouth every 6 (six) hours as needed for flatulence.  Marland Kitchen spironolactone (ALDACTONE) 25 MG tablet Take 25 mg by mouth daily. For CHF  . tamsulosin (FLOMAX) 0.4 MG CAPS capsule Take 0.4 mg by mouth daily. For incontinence  . torsemide (DEMADEX) 20 MG tablet  Take 20 mg by mouth daily.  Marland Kitchen zolpidem (AMBIEN) 5 MG tablet Take 1 tablet (5 mg total) at bedtime by mouth.   No facility-administered encounter medications on file as of 05/30/2017.      SIGNIFICANT DIAGNOSTIC EXAMS  PREVIOUS   10-08-14: MRI brain: No significant change in diffuse white matter lesions consistent with patient's history of multiple sclerosis. None of these areas demonstrate abnormal restriction or enhancement as may be seen with areas  of acute demyelination. Global atrophy. 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 Right parotid 1 cm lesion does not have typical appearance of an intra parotid lymph node and therefore primary parotid lesion is a possibility. ENT consultation may be considered for further delineation.  10-08-14: mri of cervical; spine: Scattered new areas of altered signal intensity (compared to 2007) throughout the cervical cord as detailed above consistent with patient's history of multiple sclerosis. None of these areas demonstrate enhancement as may be seen with areas of active demyelination. Cervical spondylotic changes as detailed above most prominent C5-6 level.  01-12-15: 2-d echo: Left ventricle: The cavity size was normal. There was mild concentric hypertrophy. Systolic function was normal. The estimated ejection fraction was in the range of 60% to 65%. Wall motion was normal; there were no regional wall motion abnormalities. Doppler parameters are consistent with abnormal left ventricular relaxation (grade 1 diastolic dysfunction). - Aortic valve: There was mild regurgitation. - Mitral valve: Calcified annulus. There was mild regurgitation.  07-14-15: EEG: This EEG is consistent with a mild generalized non-specific cerebral dysfunction(encephalopathy).   07-20-15: chest x-ray; Interval extubation. Decreasing lung volumes with increasing bibasilar opacities, likely atelectasis.  11-07-15: ct of  head; No acute intracranial abnormality. White matter changes are suggestive for chronic small vessel ischemic disease. Right maxillary sinus disease.   NO NEW EXAMS   LABS REVIEWED: PREVIOUS    08-09-16: chol 159; ldl 109; trig 109; hdl 28  09-10-16: wbc 4.9; hgb 10.5; hct 33.2; mcv 86.6; plt 253  glucose 164; bun 9.0; creat 1.12; k+ 3.8; na++ 14; liver normal albumin 3.8 chol 200; ldl 142; trig 144; hdl 29  11-20-16: urine culture: providencia stuartii: fosfomycin 3 gm 12-12-16: urine culture: providencia stuartii: rocephin  01-29-17: wbc 5.1; hgb 10.8; hct 32.1; mcv 85.2; plt 262; glucose 128; bun 17.1; creat 1.25; k+ 4.2; na++ 144; alk phos 138; albumin 3.9  02-27-17:wbc 6.5; hgb 9.5; hct 30.4; mcv 88.2; plt 254; glucose 123; bun 21; creat 1.40; k+ 4.8; na++ 141; ca 9.1; alk phos 137; albumin 3.6 04-02-17: wbc 7.2; hgb 11.7; hct 37.8; mv 88.7; plt 285; flu: A/B: neg     TODAY:   05-20-17: e-coli: cipro   Review of Systems  Constitutional: Negative for malaise/fatigue.  Respiratory: Negative for cough and shortness of breath.   Cardiovascular: Negative for chest pain, palpitations and leg swelling.  Gastrointestinal: Negative for abdominal pain, constipation and heartburn.  Musculoskeletal: Negative for back pain, joint pain and myalgias.  Skin: Negative.   Neurological: Negative for dizziness.  Psychiatric/Behavioral: The patient is not nervous/anxious.      Physical Exam  Constitutional: She is oriented to person, place, and time. She appears well-developed and well-nourished. No distress.  Obese   Neck: No thyromegaly present.  Cardiovascular: Regular rhythm, normal heart sounds and intact distal pulses.  No murmur heard. Pulmonary/Chest: Effort normal and breath sounds normal. No respiratory distress.  Abdominal: Soft. Bowel sounds are normal. She exhibits no distension. There is no tenderness.  Musculoskeletal: She exhibits no edema.  Limited movement of upper extremities   Unable to move lower extremities   Lymphadenopathy:    She has no cervical adenopathy.  Neurological: She is alert and oriented to person, place, and time.  Skin: Skin is warm and dry. She is not diaphoretic.  Psychiatric: She has a normal mood and affect.      ASSESSMENT/ PLAN:  TODAY:  1 Depression: is presently stable  taking cymbalta 60 mg daily; which also helps with her pain management.  ambien 5  mg nightly   2. Hypertension b/p135/82 : is stable  will continue  coreg 25 mg twice daily and will monitor   3. Neurogenic bladder: no change in status  takes flomax 0.4 mg daily  myrbetriq 50 mg daily  Does not require I/o cath at this time.   4. Jerrye Bushy: is stable  will continue pepcid 20 mg twice daily   PREVIOUS    5. Glaucoma:  Is stable will continue xalatan to both eyes.    6. MS: is slowly declining   Has been  followed by  neurology;  Has paraplegia.will continue amantadine 100 mg daily baclofen 20 mg tid prn  for muscle spasms;   Saw neurology on 09-26-16   7. Chronic pain: is stable:   will continue  neurontin 700 mg twice  Daily and 300 mg nightly; takes cymbalta 60 mg daily   tylenol 650 mg three times daily routinely and is using biofreeze to shoulder four times daily   8. Chronic combined systolic and diastolic heart failure:EF is 60-65%(01-12-15)    is stable will continue demadex 20  mg daily; with k+ 20 meq daily; will continue aldactone 25 mg daily;    9. Constipation:  Is without change; will continue senna s twice daily and will make lactulose  30 cc daily and will monitor    Will check cbc; cmp lipids   MD is aware of resident's narcotic use and is in agreement with current plan of care. We will attempt to wean resident as apropriate     Ok Edwards NP [facility]  Contact [phone] Monday through Friday 8am- 5pm  After hours call [phone]

## 2017-05-31 DIAGNOSIS — D649 Anemia, unspecified: Secondary | ICD-10-CM | POA: Diagnosis not present

## 2017-05-31 DIAGNOSIS — I10 Essential (primary) hypertension: Secondary | ICD-10-CM | POA: Diagnosis not present

## 2017-05-31 DIAGNOSIS — E039 Hypothyroidism, unspecified: Secondary | ICD-10-CM | POA: Diagnosis not present

## 2017-05-31 DIAGNOSIS — E785 Hyperlipidemia, unspecified: Secondary | ICD-10-CM | POA: Diagnosis not present

## 2017-05-31 LAB — BASIC METABOLIC PANEL
BUN: 28 — AB (ref 4–21)
CREATININE: 1.6 — AB (ref 0.5–1.1)
GLUCOSE: 123
Potassium: 4 (ref 3.4–5.3)
SODIUM: 141 (ref 137–147)

## 2017-05-31 LAB — LIPID PANEL
Cholesterol: 204 — AB (ref 0–200)
HDL: 31 — AB (ref 35–70)
LDL CALC: 142
Triglycerides: 155 (ref 40–160)

## 2017-05-31 LAB — HEPATIC FUNCTION PANEL
ALK PHOS: 145 — AB (ref 25–125)
ALT: 12 (ref 7–35)
AST: 16 (ref 13–35)
BILIRUBIN, TOTAL: 0.2

## 2017-05-31 LAB — CBC AND DIFFERENTIAL
HCT: 31 — AB (ref 36–46)
Hemoglobin: 10.1 — AB (ref 12.0–16.0)
NEUTROS ABS: 4
Platelets: 292 (ref 150–399)
WBC: 5.9

## 2017-06-03 ENCOUNTER — Non-Acute Institutional Stay (SKILLED_NURSING_FACILITY): Payer: Medicare Other | Admitting: Adult Health

## 2017-06-03 ENCOUNTER — Encounter: Payer: Self-pay | Admitting: Adult Health

## 2017-06-03 DIAGNOSIS — D649 Anemia, unspecified: Secondary | ICD-10-CM | POA: Diagnosis not present

## 2017-06-03 DIAGNOSIS — I10 Essential (primary) hypertension: Secondary | ICD-10-CM | POA: Diagnosis not present

## 2017-06-03 DIAGNOSIS — G894 Chronic pain syndrome: Secondary | ICD-10-CM

## 2017-06-03 DIAGNOSIS — E785 Hyperlipidemia, unspecified: Secondary | ICD-10-CM | POA: Diagnosis not present

## 2017-06-03 DIAGNOSIS — R748 Abnormal levels of other serum enzymes: Secondary | ICD-10-CM | POA: Diagnosis not present

## 2017-06-03 DIAGNOSIS — E039 Hypothyroidism, unspecified: Secondary | ICD-10-CM | POA: Diagnosis not present

## 2017-06-03 DIAGNOSIS — K5909 Other constipation: Secondary | ICD-10-CM

## 2017-06-03 LAB — CBC AND DIFFERENTIAL
HCT: 32 — AB (ref 36–46)
HEMOGLOBIN: 10.5 — AB (ref 12.0–16.0)
NEUTROS ABS: 3
Platelets: 287 (ref 150–399)
WBC: 5

## 2017-06-03 NOTE — Progress Notes (Signed)
Location:   [place] Room Number: [place] of Service:  SNF (31)   CODE STATUS: DNR  Allergies  Allergen Reactions  . Sulfonamide Derivatives Hives and Shortness Of Breath  . Penicillins Hives and Itching    Has patient had a PCN reaction causing immediate rash, facial/tongue/throat swelling, SOB or lightheadedness with hypotension: unknown Has patient had a PCN reaction causing severe rash involving mucus membranes or skin necrosis: unknown Has patient had a PCN reaction that required hospitalization: unknown Has patient had a PCN reaction occurring within the last 10 years: unknown If all of the above answers are "NO", then may proceed with Cephalosporin use. Prior course of rocephin charted 05/2015    Chief Complaint  Patient presents with  . Acute Visit    Pain Management    HPI:  She is complaining of shoulder pain. States that the current rub she is using is not effective and would like a different one used. She continues to struggle with constipation. We did discuss her elevated liver enzymes. She will need further lab work done and possibly further testing as well. She does verbalize understanding of up coming testing. She denies any nausea or vomiting. There are no reports of fevers present. There are no nursing concerns at this time.  Her alk phos level is elevated currently at 145. Although slight it has been elevated for several months.    Past Medical History:  Diagnosis Date  . Cardiomyopathy ([place])    a.  Echo 04/29/12: Mild LVH, EF 20-25%, mild AI, moderate MR, moderate LAE, mild RAE, mild RVE, moderate TR, PASP 51, small pericardial effusion;   b. probably non-ischemic given multiple chemo-Tx agents used for MS and global LV dysfn on echo  . Chronic systolic heart failure ([place])   . Depression   . Glaucoma   . Hypertension   . MS (multiple sclerosis) ([place])    a. Dx'd late 20's. b. Tx with Novantrone, Tysabri, Copaxone previously.    Past  Surgical History:  Procedure Laterality Date  . ABLATION     uterine  . CESAREAN SECTION      Social History   Socioeconomic History  . Marital status: Single    Spouse name: Not on file  . Number of children: Not on file  . Years of education: Not on file  . Highest education level: Not on file  Social Needs  . Financial resource strain: Not on file  . Food insecurity - worry: Not on file  . Food insecurity - inability: Not on file  . Transportation needs - medical: Not on file  . Transportation needs - non-medical: Not on file  Occupational History  . Not on file  Tobacco Use  . Smoking status: Former Smoker    Packs/day: 0.50    Years: 5.00    Pack years: 2.50  . Smokeless tobacco: Never Used  Substance and Sexual Activity  . Alcohol use: No  . Drug use: No  . Sexual activity: Not Currently  Other Topics Concern  . Not on file  Social History Narrative  . Not on file   Family History  Problem Relation Age of Onset  . Hypertension Mother   . Cancer Mother        breast   . Cancer Father        prostate  . Multiple sclerosis Sister   . Heart attack Neg Hx       VITAL SIGNS BP (!) 142/84  Pulse 72   Temp 98.7 F (37.1 C)   Resp 20   Ht '5\' 5"'$  (1.651 m)   Wt 240 lb 8 oz (109.1 kg)   BMI 40.02 kg/m   Outpatient Encounter Medications as of 06/03/2017  Medication Sig  . acetaminophen (TYLENOL) 325 MG tablet Take 650 mg by mouth 3 (three) times daily.  Marland Kitchen albuterol (PROVENTIL) (2.5 MG/3ML) 0.083% nebulizer solution Take 3 mLs (2.5 mg total) by nebulization every 2 (two) hours as needed for wheezing.  Marland Kitchen amantadine (SYMMETREL) 100 MG capsule Take 100 mg by mouth daily.   . baclofen (LIORESAL) 20 MG tablet Take 1 tablet (20 mg total) by mouth 3 (three) times daily as needed for muscle spasms.  . carvedilol (COREG) 25 MG tablet Take 1 tablet (25 mg total) by mouth 2 (two) times daily with a meal.  . Cholecalciferol (VITAMIN D-3 PO) Take 1,000 Int'l Units/day  by mouth at bedtime.   Marland Kitchen CRANBERRY PO Take 100 mg by mouth at bedtime.   . DULoxetine (CYMBALTA) 60 MG capsule Take 60 mg by mouth daily.  . famotidine (PEPCID) 20 MG tablet Take 1 tablet (20 mg total) by mouth 2 (two) times daily.  . fluticasone (FLONASE) 50 MCG/ACT nasal spray Place 2 sprays into both nostrils every evening.  . gabapentin (NEURONTIN) 100 MG capsule Give 7 tablets (700 mg) by mouth two times daily and 3 tablets (300 mg) by mouth at bedtime  . hydrocortisone cream 0.5 % Apply to cheeks topically two times a day for dry areas, avoid eyes  . Lactase (LACTAID PO) Take 1 tablet by mouth at bedtime.   . Lactulose 20 GM/30ML SOLN Take 30 mLs by mouth daily as needed.   . latanoprost (XALATAN) 0.005 % ophthalmic solution Place 1 drop into both eyes at bedtime.  Marland Kitchen loratadine (CLARITIN) 10 MG tablet Take 10 mg by mouth daily.  . magnesium hydroxide (MILK OF MAGNESIA) 400 MG/5ML suspension Take 30 mLs by mouth every 8 (eight) hours as needed for mild constipation or moderate constipation.  . Menthol, Topical Analgesic, (BIOFREEZE) 4 % GEL Apply to left shoulder topically four times a day for pain  . mirabegron ER (MYRBETRIQ) 50 MG TB24 tablet Take 50 mg by mouth at bedtime.   . phenazopyridine (PYRIDIUM) 100 MG tablet Take 100 mg by mouth every 8 (eight) hours as needed for pain.  . potassium chloride (KLOR-CON) 20 MEQ packet Take 20 mEq by mouth daily.   . Probiotic CAPS Take 1 capsule by mouth 2 (two) times daily.  Marland Kitchen Propylene Glycol (SYSTANE BALANCE) 0.6 % SOLN Instill 1 drop in both eyes two times a day for dry eyes  . senna-docusate (SENOKOT S) 8.6-50 MG tablet Take 2 tablets by mouth 2 (two) times daily.   . simethicone (MYLICON) 80 MG chewable tablet Chew 80 mg by mouth every 6 (six) hours as needed for flatulence.  Marland Kitchen spironolactone (ALDACTONE) 25 MG tablet Take 25 mg by mouth daily. For CHF  . tamsulosin (FLOMAX) 0.4 MG CAPS capsule Take 0.4 mg by mouth daily. For incontinence  .  torsemide (DEMADEX) 20 MG tablet Take 20 mg by mouth daily.  Marland Kitchen zolpidem (AMBIEN) 5 MG tablet Take 1 tablet (5 mg total) at bedtime by mouth.   No facility-administered encounter medications on file as of 06/03/2017.      SIGNIFICANT DIAGNOSTIC EXAMS  PREVIOUS   10-08-14: MRI brain: No significant change in diffuse white matter lesions consistent with patient's history of multiple sclerosis.  None of these areas demonstrate abnormal restriction or enhancement as may be seen with areas of acute demyelination. Global atrophy. 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 Right parotid 1 cm lesion does not have typical appearance of an intra parotid lymph node and therefore primary parotid lesion is a possibility. ENT consultation may be considered for further delineation.  10-08-14: mri of cervical; spine: Scattered new areas of altered signal intensity (compared to 2007) throughout the cervical cord as detailed above consistent with patient's history of multiple sclerosis. None of these areas demonstrate enhancement as may be seen with areas of active demyelination. Cervical spondylotic changes as detailed above most prominent C5-6 level.  01-12-15: 2-d echo: Left ventricle: The cavity size was normal. There was mild concentric hypertrophy. Systolic function was normal. The estimated ejection fraction was in the range of 60% to 65%. Wall motion was normal; there were no regional wall motion abnormalities. Doppler parameters are consistent with abnormal left ventricular relaxation (grade 1 diastolic dysfunction). - Aortic valve: There was mild regurgitation. - Mitral valve: Calcified annulus. There was mild regurgitation.  07-14-15: EEG: This EEG is consistent with a mild generalized non-specific cerebral dysfunction(encephalopathy).   07-20-15: chest x-ray; Interval extubation. Decreasing lung volumes with increasing bibasilar opacities,  likely atelectasis.  11-07-15: ct of head; No acute intracranial abnormality. White matter changes are suggestive for chronic small vessel ischemic disease. Right maxillary sinus disease.   NO NEW EXAMS   LABS REVIEWED: PREVIOUS    08-09-16: chol 159; ldl 109; trig 109; hdl 28  09-10-16: wbc 4.9; hgb 10.5; hct 33.2; mcv 86.6; plt 253  glucose 164; bun 9.0; creat 1.12; k+ 3.8; na++ 14; liver normal albumin 3.8 chol 200; ldl 142; trig 144; hdl 29  11-20-16: urine culture: providencia stuartii: fosfomycin 3 gm 12-12-16: urine culture: providencia stuartii: rocephin  01-29-17: wbc 5.1; hgb 10.8; hct 32.1; mcv 85.2; plt 262; glucose 128; bun 17.1; creat 1.25; k+ 4.2; na++ 144; alk phos 138; albumin 3.9  02-27-17:wbc 6.5; hgb 9.5; hct 30.4; mcv 88.2; plt 254; glucose 123; bun 21; creat 1.40; k+ 4.8; na++ 141; ca 9.1; alk phos 137; albumin 3.6 04-02-17: wbc 7.2; hgb 11.7; hct 37.8; mv 88.7; plt 285; flu: A/B: neg    05-20-17: e-coli: cipro  TODAY:   05-31-17: wbc 5.9; hgb 10.2; hct 30.6; mcv 84.0; plt 292; glucose 123; bun 27.5; creat 1.57; k+ 4.0; na++141; ca 9.2; alk phos 145; albumin 4.0; chol 204; ldl 142; trig 155; hdl 33.     Review of Systems  Constitutional: Negative for fever and malaise/fatigue.  Respiratory: Negative for cough and shortness of breath.   Cardiovascular: Negative for chest pain, palpitations and leg swelling.  Gastrointestinal: Positive for constipation. Negative for abdominal pain and heartburn.  Musculoskeletal: Negative for back pain, joint pain and myalgias.  Skin: Negative.   Neurological: Negative for dizziness.  Psychiatric/Behavioral: The patient is not nervous/anxious.      Physical Exam  Constitutional: She is oriented to person, place, and time. She appears well-developed and well-nourished. No distress.  Neck: No thyromegaly present.  Cardiovascular: Normal rate, regular rhythm, normal heart sounds and intact distal pulses.  Pulmonary/Chest: Effort normal  and breath sounds normal. No respiratory distress.  Abdominal: Soft. Bowel sounds are normal. She exhibits no distension. There is no tenderness.  Musculoskeletal: She exhibits no edema.  Limited movement of upper extremities  Unable to move lower extremities  Lymphadenopathy:    She has no cervical adenopathy.  Neurological: She is alert and oriented to person, place, and time.  Skin: Skin is warm and dry. She is not diaphoretic.  Psychiatric: She has a normal mood and affect.     ASSESSMENT/ PLAN:  TODAY:   1.  Chronic constipation 2.  Dyslipidemia 3. Elevated alk phos 4. Chronic pain syndrome  Will check GGT and hgb a1c Will begin lipitor 10 mg nightly  Will recheck lipids and liver function in 6 weeks Will being linzess 72 mcg daily Will use bengay to both shoulders twice daily    MD is aware of resident's narcotic use and is in agreement with current plan of care. We will attempt to wean resident as apropriate   Ok Edwards NP [facility]  Contact [phone] Monday through Friday 8am- 5pm  After hours call [phone]

## 2017-06-04 DIAGNOSIS — E119 Type 2 diabetes mellitus without complications: Secondary | ICD-10-CM | POA: Diagnosis not present

## 2017-06-04 DIAGNOSIS — Z79899 Other long term (current) drug therapy: Secondary | ICD-10-CM | POA: Diagnosis not present

## 2017-06-04 DIAGNOSIS — R531 Weakness: Secondary | ICD-10-CM | POA: Diagnosis not present

## 2017-06-04 DIAGNOSIS — G35 Multiple sclerosis: Secondary | ICD-10-CM | POA: Diagnosis not present

## 2017-06-04 DIAGNOSIS — R293 Abnormal posture: Secondary | ICD-10-CM | POA: Diagnosis not present

## 2017-06-04 LAB — HEMOGLOBIN A1C: Hemoglobin A1C: 5.7

## 2017-06-05 DIAGNOSIS — R531 Weakness: Secondary | ICD-10-CM | POA: Diagnosis not present

## 2017-06-05 DIAGNOSIS — R293 Abnormal posture: Secondary | ICD-10-CM | POA: Diagnosis not present

## 2017-06-05 DIAGNOSIS — G35 Multiple sclerosis: Secondary | ICD-10-CM | POA: Diagnosis not present

## 2017-06-06 DIAGNOSIS — G35 Multiple sclerosis: Secondary | ICD-10-CM | POA: Diagnosis not present

## 2017-06-06 DIAGNOSIS — R293 Abnormal posture: Secondary | ICD-10-CM | POA: Diagnosis not present

## 2017-06-06 DIAGNOSIS — R531 Weakness: Secondary | ICD-10-CM | POA: Diagnosis not present

## 2017-06-07 DIAGNOSIS — R531 Weakness: Secondary | ICD-10-CM | POA: Diagnosis not present

## 2017-06-07 DIAGNOSIS — R293 Abnormal posture: Secondary | ICD-10-CM | POA: Diagnosis not present

## 2017-06-07 DIAGNOSIS — G35 Multiple sclerosis: Secondary | ICD-10-CM | POA: Diagnosis not present

## 2017-06-10 DIAGNOSIS — R293 Abnormal posture: Secondary | ICD-10-CM | POA: Diagnosis not present

## 2017-06-10 DIAGNOSIS — G35 Multiple sclerosis: Secondary | ICD-10-CM | POA: Diagnosis not present

## 2017-06-10 DIAGNOSIS — R531 Weakness: Secondary | ICD-10-CM | POA: Diagnosis not present

## 2017-06-11 DIAGNOSIS — G35 Multiple sclerosis: Secondary | ICD-10-CM | POA: Diagnosis not present

## 2017-06-11 DIAGNOSIS — R531 Weakness: Secondary | ICD-10-CM | POA: Diagnosis not present

## 2017-06-11 DIAGNOSIS — R293 Abnormal posture: Secondary | ICD-10-CM | POA: Diagnosis not present

## 2017-06-12 ENCOUNTER — Other Ambulatory Visit: Payer: Self-pay

## 2017-06-12 DIAGNOSIS — G35 Multiple sclerosis: Secondary | ICD-10-CM | POA: Diagnosis not present

## 2017-06-12 DIAGNOSIS — R531 Weakness: Secondary | ICD-10-CM | POA: Diagnosis not present

## 2017-06-12 DIAGNOSIS — R293 Abnormal posture: Secondary | ICD-10-CM | POA: Diagnosis not present

## 2017-06-12 MED ORDER — ZOLPIDEM TARTRATE 5 MG PO TABS: 5.0000 mg | ORAL_TABLET | Freq: Every day | ORAL | 0 refills | Status: DC

## 2017-06-12 NOTE — Telephone Encounter (Signed)
RX faxed to [facility] @ [phone], phone number [phone] 

## 2017-06-13 DIAGNOSIS — R748 Abnormal levels of other serum enzymes: Secondary | ICD-10-CM | POA: Insufficient documentation

## 2017-06-13 DIAGNOSIS — E785 Hyperlipidemia, unspecified: Secondary | ICD-10-CM | POA: Insufficient documentation

## 2017-06-13 DIAGNOSIS — R531 Weakness: Secondary | ICD-10-CM | POA: Diagnosis not present

## 2017-06-13 DIAGNOSIS — G35 Multiple sclerosis: Secondary | ICD-10-CM | POA: Diagnosis not present

## 2017-06-13 DIAGNOSIS — R293 Abnormal posture: Secondary | ICD-10-CM | POA: Diagnosis not present

## 2017-06-14 DIAGNOSIS — M6249 Contracture of muscle, multiple sites: Secondary | ICD-10-CM | POA: Diagnosis not present

## 2017-06-14 DIAGNOSIS — G35 Multiple sclerosis: Secondary | ICD-10-CM | POA: Diagnosis not present

## 2017-06-14 DIAGNOSIS — M79605 Pain in left leg: Secondary | ICD-10-CM | POA: Diagnosis not present

## 2017-06-14 DIAGNOSIS — M79604 Pain in right leg: Secondary | ICD-10-CM | POA: Diagnosis not present

## 2017-06-14 DIAGNOSIS — R531 Weakness: Secondary | ICD-10-CM | POA: Diagnosis not present

## 2017-06-14 DIAGNOSIS — R293 Abnormal posture: Secondary | ICD-10-CM | POA: Diagnosis not present

## 2017-06-14 DIAGNOSIS — R2689 Other abnormalities of gait and mobility: Secondary | ICD-10-CM | POA: Diagnosis not present

## 2017-06-16 DIAGNOSIS — R293 Abnormal posture: Secondary | ICD-10-CM | POA: Diagnosis not present

## 2017-06-16 DIAGNOSIS — R531 Weakness: Secondary | ICD-10-CM | POA: Diagnosis not present

## 2017-06-16 DIAGNOSIS — G35 Multiple sclerosis: Secondary | ICD-10-CM | POA: Diagnosis not present

## 2017-06-17 DIAGNOSIS — R293 Abnormal posture: Secondary | ICD-10-CM | POA: Diagnosis not present

## 2017-06-17 DIAGNOSIS — G35 Multiple sclerosis: Secondary | ICD-10-CM | POA: Diagnosis not present

## 2017-06-17 DIAGNOSIS — M6249 Contracture of muscle, multiple sites: Secondary | ICD-10-CM | POA: Diagnosis not present

## 2017-06-17 DIAGNOSIS — R2689 Other abnormalities of gait and mobility: Secondary | ICD-10-CM | POA: Diagnosis not present

## 2017-06-17 DIAGNOSIS — M79605 Pain in left leg: Secondary | ICD-10-CM | POA: Diagnosis not present

## 2017-06-17 DIAGNOSIS — M79604 Pain in right leg: Secondary | ICD-10-CM | POA: Diagnosis not present

## 2017-06-17 DIAGNOSIS — R531 Weakness: Secondary | ICD-10-CM | POA: Diagnosis not present

## 2017-06-18 DIAGNOSIS — R293 Abnormal posture: Secondary | ICD-10-CM | POA: Diagnosis not present

## 2017-06-18 DIAGNOSIS — G35 Multiple sclerosis: Secondary | ICD-10-CM | POA: Diagnosis not present

## 2017-06-18 DIAGNOSIS — R531 Weakness: Secondary | ICD-10-CM | POA: Diagnosis not present

## 2017-06-19 DIAGNOSIS — R293 Abnormal posture: Secondary | ICD-10-CM | POA: Diagnosis not present

## 2017-06-19 DIAGNOSIS — R531 Weakness: Secondary | ICD-10-CM | POA: Diagnosis not present

## 2017-06-19 DIAGNOSIS — G35 Multiple sclerosis: Secondary | ICD-10-CM | POA: Diagnosis not present

## 2017-06-20 DIAGNOSIS — R293 Abnormal posture: Secondary | ICD-10-CM | POA: Diagnosis not present

## 2017-06-20 DIAGNOSIS — M79605 Pain in left leg: Secondary | ICD-10-CM | POA: Diagnosis not present

## 2017-06-20 DIAGNOSIS — M6249 Contracture of muscle, multiple sites: Secondary | ICD-10-CM | POA: Diagnosis not present

## 2017-06-20 DIAGNOSIS — R2689 Other abnormalities of gait and mobility: Secondary | ICD-10-CM | POA: Diagnosis not present

## 2017-06-20 DIAGNOSIS — R531 Weakness: Secondary | ICD-10-CM | POA: Diagnosis not present

## 2017-06-20 DIAGNOSIS — M79604 Pain in right leg: Secondary | ICD-10-CM | POA: Diagnosis not present

## 2017-06-20 DIAGNOSIS — G35 Multiple sclerosis: Secondary | ICD-10-CM | POA: Diagnosis not present

## 2017-06-21 DIAGNOSIS — G35 Multiple sclerosis: Secondary | ICD-10-CM | POA: Diagnosis not present

## 2017-06-21 DIAGNOSIS — R293 Abnormal posture: Secondary | ICD-10-CM | POA: Diagnosis not present

## 2017-06-21 DIAGNOSIS — R531 Weakness: Secondary | ICD-10-CM | POA: Diagnosis not present

## 2017-06-23 DIAGNOSIS — G35 Multiple sclerosis: Secondary | ICD-10-CM | POA: Diagnosis not present

## 2017-06-23 DIAGNOSIS — R531 Weakness: Secondary | ICD-10-CM | POA: Diagnosis not present

## 2017-06-23 DIAGNOSIS — R293 Abnormal posture: Secondary | ICD-10-CM | POA: Diagnosis not present

## 2017-06-24 DIAGNOSIS — G35 Multiple sclerosis: Secondary | ICD-10-CM | POA: Diagnosis not present

## 2017-06-24 DIAGNOSIS — R293 Abnormal posture: Secondary | ICD-10-CM | POA: Diagnosis not present

## 2017-06-24 DIAGNOSIS — R531 Weakness: Secondary | ICD-10-CM | POA: Diagnosis not present

## 2017-06-26 ENCOUNTER — Non-Acute Institutional Stay (SKILLED_NURSING_FACILITY): Payer: Medicare Other | Admitting: Adult Health

## 2017-06-26 ENCOUNTER — Encounter: Payer: Self-pay | Admitting: Adult Health

## 2017-06-26 DIAGNOSIS — M79605 Pain in left leg: Secondary | ICD-10-CM | POA: Diagnosis not present

## 2017-06-26 DIAGNOSIS — K5909 Other constipation: Secondary | ICD-10-CM

## 2017-06-26 DIAGNOSIS — R293 Abnormal posture: Secondary | ICD-10-CM | POA: Diagnosis not present

## 2017-06-26 DIAGNOSIS — R531 Weakness: Secondary | ICD-10-CM | POA: Diagnosis not present

## 2017-06-26 DIAGNOSIS — M79604 Pain in right leg: Secondary | ICD-10-CM | POA: Diagnosis not present

## 2017-06-26 DIAGNOSIS — R2689 Other abnormalities of gait and mobility: Secondary | ICD-10-CM | POA: Diagnosis not present

## 2017-06-26 DIAGNOSIS — G35 Multiple sclerosis: Secondary | ICD-10-CM | POA: Diagnosis not present

## 2017-06-26 DIAGNOSIS — M6249 Contracture of muscle, multiple sites: Secondary | ICD-10-CM | POA: Diagnosis not present

## 2017-06-26 NOTE — Progress Notes (Signed)
Location:   [facility] Room Number: [place] of Service:  SNF (31)   CODE STATUS: DNR  Allergies  Allergen Reactions  . Sulfonamide Derivatives Hives and Shortness Of Breath  . Penicillins Hives and Itching    Has patient had a PCN reaction causing immediate rash, facial/tongue/throat swelling, SOB or lightheadedness with hypotension: unknown Has patient had a PCN reaction causing severe rash involving mucus membranes or skin necrosis: unknown Has patient had a PCN reaction that required hospitalization: unknown Has patient had a PCN reaction occurring within the last 10 years: unknown If all of the above answers are "NO", then may proceed with Cephalosporin use. Prior course of rocephin charted 05/2015    Chief Complaint  Patient presents with  . Acute Visit    Constipation    HPI:  She continues to struggle with constipation. She tells me that she is going more than 3-4 days without a bowel movement. She feels as though her current regimen is not effective. She denies any nausea or vomiting. There are no reports of any changes in appetite and no fevers.    Past Medical History:  Diagnosis Date  . Cardiomyopathy ([place])    a.  Echo 04/29/12: Mild LVH, EF 20-25%, mild AI, moderate MR, moderate LAE, mild RAE, mild RVE, moderate TR, PASP 51, small pericardial effusion;   b. probably non-ischemic given multiple chemo-Tx agents used for MS and global LV dysfn on echo  . Chronic systolic heart failure ([place])   . Depression   . Glaucoma   . Hypertension   . MS (multiple sclerosis) ([place])    a. Dx'd late 20's. b. Tx with Novantrone, Tysabri, Copaxone previously.    Past Surgical History:  Procedure Laterality Date  . ABLATION     uterine  . CESAREAN SECTION      Social History   Socioeconomic History  . Marital status: Single    Spouse name: Not on file  . Number of children: Not on file  . Years of education: Not on file  . Highest education level: Not  on file  Social Needs  . Financial resource strain: Not on file  . Food insecurity - worry: Not on file  . Food insecurity - inability: Not on file  . Transportation needs - medical: Not on file  . Transportation needs - non-medical: Not on file  Occupational History  . Not on file  Tobacco Use  . Smoking status: Former Smoker    Packs/day: 0.50    Years: 5.00    Pack years: 2.50  . Smokeless tobacco: Never Used  Substance and Sexual Activity  . Alcohol use: No  . Drug use: No  . Sexual activity: Not Currently  Other Topics Concern  . Not on file  Social History Narrative  . Not on file   Family History  Problem Relation Age of Onset  . Hypertension Mother   . Cancer Mother        breast   . Cancer Father        prostate  . Multiple sclerosis Sister   . Heart attack Neg Hx       VITAL SIGNS BP 110/64   Pulse 74   Temp (!) 97.3 F (36.3 C)   Resp 18   Ht '5\' 5"'$  (1.651 m)   Wt 241 lb (109.3 kg)   SpO2 95%   BMI 40.10 kg/m   Outpatient Encounter Medications as of 06/26/2017  Medication Sig  .  acetaminophen (TYLENOL) 325 MG tablet Take 650 mg by mouth 3 (three) times daily.  Marland Kitchen albuterol (PROVENTIL) (2.5 MG/3ML) 0.083% nebulizer solution Take 3 mLs (2.5 mg total) by nebulization every 2 (two) hours as needed for wheezing.  Marland Kitchen amantadine (SYMMETREL) 100 MG capsule Take 100 mg by mouth daily.   Marland Kitchen atorvastatin (LIPITOR) 10 MG tablet Take 10 mg by mouth at bedtime.  . Baclofen 5 MG TABS Take 1 tablet by mouth 3 (three) times daily. Hold if lethargic or confused  . carvedilol (COREG) 25 MG tablet Take 1 tablet (25 mg total) by mouth 2 (two) times daily with a meal.  . Cholecalciferol (VITAMIN D-3 PO) Take 1,000 Int'l Units/day by mouth at bedtime.   Marland Kitchen CRANBERRY PO Take 100 mg by mouth at bedtime.   . DULoxetine (CYMBALTA) 60 MG capsule Take 60 mg by mouth daily.  . famotidine (PEPCID) 20 MG tablet Take 1 tablet (20 mg total) by mouth 2 (two) times daily.  .  fluticasone (FLONASE) 50 MCG/ACT nasal spray Place 2 sprays into both nostrils every evening.  Marland Kitchen GABAPENTIN PO Give 1000 mg by mouth two times daily.  . hydrocortisone cream 0.5 % Apply to cheeks topically two times a day for dry areas, avoid eyes  . Lactase (LACTAID PO) Take 1 tablet by mouth at bedtime.   . Lactulose 20 GM/30ML SOLN Take 30 mLs by mouth daily as needed.   . latanoprost (XALATAN) 0.005 % ophthalmic solution Place 1 drop into both eyes at bedtime.  Marland Kitchen linaclotide (LINZESS) 72 MCG capsule Take 72 mcg by mouth daily before breakfast.  . loratadine (CLARITIN) 10 MG tablet Take 10 mg by mouth daily.  . magnesium hydroxide (MILK OF MAGNESIA) 400 MG/5ML suspension Take 30 mLs by mouth every 8 (eight) hours as needed for mild constipation or moderate constipation.  . Menthol, Topical Analgesic, (BIOFREEZE) 4 % GEL Apply to left shoulder topically four times a day for pain  . mirabegron ER (MYRBETRIQ) 50 MG TB24 tablet Take 50 mg by mouth at bedtime.   . phenazopyridine (PYRIDIUM) 100 MG tablet Take 100 mg by mouth every 8 (eight) hours as needed for pain.  . potassium chloride (KLOR-CON) 20 MEQ packet Take 20 mEq by mouth daily.   Marland Kitchen Propylene Glycol (SYSTANE BALANCE) 0.6 % SOLN Instill 1 drop in both eyes two times a day for dry eyes  . senna-docusate (SENOKOT S) 8.6-50 MG tablet Take 2 tablets by mouth 2 (two) times daily.   . simethicone (MYLICON) 80 MG chewable tablet Chew 80 mg by mouth every 6 (six) hours as needed for flatulence.  Marland Kitchen spironolactone (ALDACTONE) 25 MG tablet Take 25 mg by mouth daily. For CHF  . tamsulosin (FLOMAX) 0.4 MG CAPS capsule Take 0.4 mg by mouth daily. For incontinence  . torsemide (DEMADEX) 20 MG tablet Take 20 mg by mouth daily.  Marland Kitchen zolpidem (AMBIEN) 5 MG tablet Take 1 tablet (5 mg total) by mouth at bedtime.  . [DISCONTINUED] baclofen (LIORESAL) 20 MG tablet Take 1 tablet (20 mg total) by mouth 3 (three) times daily as needed for muscle spasms. (Patient  not taking: Reported on 06/26/2017)  . [DISCONTINUED] Probiotic CAPS Take 1 capsule by mouth 2 (two) times daily.   No facility-administered encounter medications on file as of 06/26/2017.      SIGNIFICANT DIAGNOSTIC EXAMS  PREVIOUS   10-08-14: MRI brain: No significant change in diffuse white matter lesions consistent with patient's history of multiple sclerosis. None of these areas  demonstrate abnormal restriction or enhancement as may be seen with areas of acute demyelination. Global atrophy. 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 Right parotid 1 cm lesion does not have typical appearance of an intra parotid lymph node and therefore primary parotid lesion is a possibility. ENT consultation may be considered for further delineation.  10-08-14: mri of cervical; spine: Scattered new areas of altered signal intensity (compared to 2007) throughout the cervical cord as detailed above consistent with patient's history of multiple sclerosis. None of these areas demonstrate enhancement as may be seen with areas of active demyelination. Cervical spondylotic changes as detailed above most prominent C5-6 level.  01-12-15: 2-d echo: Left ventricle: The cavity size was normal. There was mild concentric hypertrophy. Systolic function was normal. The estimated ejection fraction was in the range of 60% to 65%. Wall motion was normal; there were no regional wall motion abnormalities. Doppler parameters are consistent with abnormal left ventricular relaxation (grade 1 diastolic dysfunction). - Aortic valve: There was mild regurgitation. - Mitral valve: Calcified annulus. There was mild regurgitation.  07-14-15: EEG: This EEG is consistent with a mild generalized non-specific cerebral dysfunction(encephalopathy).   07-20-15: chest x-ray; Interval extubation. Decreasing lung volumes with increasing bibasilar opacities, likely atelectasis.  11-07-15:  ct of head; No acute intracranial abnormality. White matter changes are suggestive for chronic small vessel ischemic disease. Right maxillary sinus disease.   NO NEW EXAMS   LABS REVIEWED: PREVIOUS    08-09-16: chol 159; ldl 109; trig 109; hdl 28  09-10-16: wbc 4.9; hgb 10.5; hct 33.2; mcv 86.6; plt 253  glucose 164; bun 9.0; creat 1.12; k+ 3.8; na++ 14; liver normal albumin 3.8 chol 200; ldl 142; trig 144; hdl 29  11-20-16: urine culture: providencia stuartii: fosfomycin 3 gm 12-12-16: urine culture: providencia stuartii: rocephin  01-29-17: wbc 5.1; hgb 10.8; hct 32.1; mcv 85.2; plt 262; glucose 128; bun 17.1; creat 1.25; k+ 4.2; na++ 144; alk phos 138; albumin 3.9  02-27-17:wbc 6.5; hgb 9.5; hct 30.4; mcv 88.2; plt 254; glucose 123; bun 21; creat 1.40; k+ 4.8; na++ 141; ca 9.1; alk phos 137; albumin 3.6 04-02-17: wbc 7.2; hgb 11.7; hct 37.8; mv 88.7; plt 285; flu: A/B: neg    05-20-17: e-coli: cipro 05-31-17: wbc 5.9; hgb 10.2; hct 30.6; mcv 84.0; plt 292; glucose 123; bun 27.5; creat 1.57; k+ 4.0; na++141; ca 9.2; alk phos 145; albumin 4.0; chol 204; ldl 142; trig 155; hdl 33.  NO NEW LABS    Review of Systems  Constitutional: Negative for malaise/fatigue.  Respiratory: Negative for cough and shortness of breath.   Cardiovascular: Negative for chest pain, palpitations and leg swelling.  Gastrointestinal: Positive for constipation. Negative for abdominal pain, heartburn and nausea.  Musculoskeletal: Negative for back pain, joint pain and myalgias.  Skin: Negative.   Neurological: Negative for dizziness.  Psychiatric/Behavioral: The patient is not nervous/anxious.       Physical Exam  Constitutional: She is oriented to person, place, and time. She appears well-developed and well-nourished. No distress.  Obese   Neck: No thyromegaly present.  Cardiovascular: Normal rate, regular rhythm, normal heart sounds and intact distal pulses.  Pulmonary/Chest: Effort normal and breath sounds  normal. No respiratory distress.  Abdominal: Soft. Bowel sounds are normal. She exhibits no distension. There is no tenderness.  Musculoskeletal: She exhibits no edema.  Limited movement of upper extremities  Unable to move lower extremities  Lymphadenopathy:    She has no cervical adenopathy.  Neurological: She is alert and oriented to person, place, and time.  Skin: Skin is warm and dry. She is not diaphoretic.  Psychiatric: She has a normal mood and affect.    ASSESSMENT/ PLAN:  TODAY:   1.  Chronic constipation  Will increase linzess to 145 mcg daily and will give her a fleets enema now   MD is aware of resident's narcotic use and is in agreement with current plan of care. We will attempt to wean resident as apropriate     Ok Edwards NP [facility]  Contact [phone] Monday through Friday 8am- 5pm  After hours call [phone]

## 2017-06-27 DIAGNOSIS — R531 Weakness: Secondary | ICD-10-CM | POA: Diagnosis not present

## 2017-06-27 DIAGNOSIS — G35 Multiple sclerosis: Secondary | ICD-10-CM | POA: Diagnosis not present

## 2017-06-27 DIAGNOSIS — R293 Abnormal posture: Secondary | ICD-10-CM | POA: Diagnosis not present

## 2017-06-28 DIAGNOSIS — G35 Multiple sclerosis: Secondary | ICD-10-CM | POA: Diagnosis not present

## 2017-06-28 DIAGNOSIS — R293 Abnormal posture: Secondary | ICD-10-CM | POA: Diagnosis not present

## 2017-06-28 DIAGNOSIS — R531 Weakness: Secondary | ICD-10-CM | POA: Diagnosis not present

## 2017-06-29 DIAGNOSIS — G35 Multiple sclerosis: Secondary | ICD-10-CM | POA: Diagnosis not present

## 2017-06-29 DIAGNOSIS — R531 Weakness: Secondary | ICD-10-CM | POA: Diagnosis not present

## 2017-06-29 DIAGNOSIS — R293 Abnormal posture: Secondary | ICD-10-CM | POA: Diagnosis not present

## 2017-07-01 ENCOUNTER — Encounter: Payer: Self-pay | Admitting: Adult Health

## 2017-07-01 ENCOUNTER — Non-Acute Institutional Stay (SKILLED_NURSING_FACILITY): Payer: Medicare Other | Admitting: Adult Health

## 2017-07-01 DIAGNOSIS — G35 Multiple sclerosis: Secondary | ICD-10-CM

## 2017-07-01 DIAGNOSIS — I5042 Chronic combined systolic (congestive) and diastolic (congestive) heart failure: Secondary | ICD-10-CM | POA: Diagnosis not present

## 2017-07-01 DIAGNOSIS — G894 Chronic pain syndrome: Secondary | ICD-10-CM | POA: Diagnosis not present

## 2017-07-01 DIAGNOSIS — R293 Abnormal posture: Secondary | ICD-10-CM | POA: Diagnosis not present

## 2017-07-01 DIAGNOSIS — R531 Weakness: Secondary | ICD-10-CM | POA: Diagnosis not present

## 2017-07-01 DIAGNOSIS — H401130 Primary open-angle glaucoma, bilateral, stage unspecified: Secondary | ICD-10-CM | POA: Diagnosis not present

## 2017-07-01 NOTE — Progress Notes (Signed)
Location:   [facility] Room Number: [place] of Service:  SNF (31)   CODE STATUS: DNR  Allergies  Allergen Reactions  . Sulfonamide Derivatives Hives and Shortness Of Breath  . Penicillins Hives and Itching    Has patient had a PCN reaction causing immediate rash, facial/tongue/throat swelling, SOB or lightheadedness with hypotension: unknown Has patient had a PCN reaction causing severe rash involving mucus membranes or skin necrosis: unknown Has patient had a PCN reaction that required hospitalization: unknown Has patient had a PCN reaction occurring within the last 10 years: unknown If all of the above answers are "NO", then may proceed with Cephalosporin use. Prior course of rocephin charted 05/2015    Chief Complaint  Patient presents with  . Medical Management of Chronic Issues    Glaucoma; MS; chronic pain; chf    HPI:  She is a long term resident of this facility being seen for the management of her chronic illnesses: glaucoma; MS: chronic pain; chf. She denies any chest pain; no back pain; no increased in weakness. There are no reports of any changes in appetite. There are no nursing concerns at this time.   Past Medical History:  Diagnosis Date  . Cardiomyopathy ([place])    a.  Echo 04/29/12: Mild LVH, EF 20-25%, mild AI, moderate MR, moderate LAE, mild RAE, mild RVE, moderate TR, PASP 51, small pericardial effusion;   b. probably non-ischemic given multiple chemo-Tx agents used for MS and global LV dysfn on echo  . Chronic systolic heart failure ([place])   . Depression   . Glaucoma   . Hypertension   . MS (multiple sclerosis) ([place])    a. Dx'd late 20's. b. Tx with Novantrone, Tysabri, Copaxone previously.    Past Surgical History:  Procedure Laterality Date  . ABLATION     uterine  . CESAREAN SECTION      Social History   Socioeconomic History  . Marital status: Single    Spouse name: Not on file  . Number of children: Not on file  . Years  of education: Not on file  . Highest education level: Not on file  Social Needs  . Financial resource strain: Not on file  . Food insecurity - worry: Not on file  . Food insecurity - inability: Not on file  . Transportation needs - medical: Not on file  . Transportation needs - non-medical: Not on file  Occupational History  . Not on file  Tobacco Use  . Smoking status: Former Smoker    Packs/day: 0.50    Years: 5.00    Pack years: 2.50  . Smokeless tobacco: Never Used  Substance and Sexual Activity  . Alcohol use: No  . Drug use: No  . Sexual activity: Not Currently  Other Topics Concern  . Not on file  Social History Narrative  . Not on file   Family History  Problem Relation Age of Onset  . Hypertension Mother   . Cancer Mother        breast   . Cancer Father        prostate  . Multiple sclerosis Sister   . Heart attack Neg Hx       VITAL SIGNS BP 110/62   Pulse 72   Temp (!) 97.2 F (36.2 C)   Resp 18   Ht '5\' 5"'$  (1.651 m)   Wt 241 lb (109.3 kg)   SpO2 95%   BMI 40.10 kg/m   Outpatient Encounter  Medications as of 07/01/2017  Medication Sig  . acetaminophen (TYLENOL) 325 MG tablet Take 650 mg by mouth 3 (three) times daily.  Marland Kitchen albuterol (PROVENTIL) (2.5 MG/3ML) 0.083% nebulizer solution Take 3 mLs (2.5 mg total) by nebulization every 2 (two) hours as needed for wheezing.  Marland Kitchen amantadine (SYMMETREL) 100 MG capsule Take 100 mg by mouth daily.   Marland Kitchen atorvastatin (LIPITOR) 10 MG tablet Take 10 mg by mouth at bedtime.  . Baclofen 5 MG TABS Take 1 tablet by mouth 3 (three) times daily. Hold if lethargic or confused  . carvedilol (COREG) 25 MG tablet Take 1 tablet (25 mg total) by mouth 2 (two) times daily with a meal.  . Cholecalciferol (VITAMIN D-3 PO) Take 1,000 Int'l Units/day by mouth at bedtime.   Marland Kitchen CRANBERRY PO Take 100 mg by mouth at bedtime.   . DULoxetine (CYMBALTA) 60 MG capsule Take 60 mg by mouth daily.  . famotidine (PEPCID) 20 MG tablet Take 1 tablet  (20 mg total) by mouth 2 (two) times daily.  . fluticasone (FLONASE) 50 MCG/ACT nasal spray Place 2 sprays into both nostrils every evening.  Marland Kitchen GABAPENTIN PO Give 1000 mg by mouth two times daily.  . hydrocortisone cream 0.5 % Apply to cheeks topically two times a day for dry areas, avoid eyes  . Lactase (LACTAID PO) Take 1 tablet by mouth at bedtime.   . Lactulose 20 GM/30ML SOLN Take 30 mLs by mouth daily as needed.   . latanoprost (XALATAN) 0.005 % ophthalmic solution Place 1 drop into both eyes at bedtime.  Marland Kitchen linaclotide (LINZESS) 145 MCG CAPS capsule Take 145 mcg by mouth daily before breakfast.   . loratadine (CLARITIN) 10 MG tablet Take 10 mg by mouth daily.  . magnesium hydroxide (MILK OF MAGNESIA) 400 MG/5ML suspension Take 30 mLs by mouth every 8 (eight) hours as needed for mild constipation or moderate constipation.  . Menthol, Topical Analgesic, (BIOFREEZE) 4 % GEL Apply to left shoulder topically four times a day for pain  . mirabegron ER (MYRBETRIQ) 50 MG TB24 tablet Take 50 mg by mouth at bedtime.   . phenazopyridine (PYRIDIUM) 100 MG tablet Take 100 mg by mouth every 8 (eight) hours as needed for pain.  . potassium chloride (KLOR-CON) 20 MEQ packet Take 20 mEq by mouth daily.   Marland Kitchen Propylene Glycol (SYSTANE BALANCE) 0.6 % SOLN Instill 1 drop in both eyes two times a day for dry eyes  . senna-docusate (SENOKOT S) 8.6-50 MG tablet Take 2 tablets by mouth 2 (two) times daily.   . simethicone (MYLICON) 80 MG chewable tablet Chew 80 mg by mouth every 6 (six) hours as needed for flatulence.  Marland Kitchen spironolactone (ALDACTONE) 25 MG tablet Take 25 mg by mouth daily. For CHF  . tamsulosin (FLOMAX) 0.4 MG CAPS capsule Take 0.4 mg by mouth daily. For incontinence  . torsemide (DEMADEX) 20 MG tablet Take 20 mg by mouth daily.  Marland Kitchen zolpidem (AMBIEN) 5 MG tablet Take 1 tablet (5 mg total) by mouth at bedtime.   No facility-administered encounter medications on file as of 07/01/2017.       SIGNIFICANT DIAGNOSTIC EXAMS  PREVIOUS   10-08-14: MRI brain: No significant change in diffuse white matter lesions consistent with patient's history of multiple sclerosis. None of these areas demonstrate abnormal restriction or enhancement as may be seen with areas of acute demyelination. Global atrophy. Decrease in size right pituitary mass now measuring 1.2 x 0.5 x 0.9 cm versus prior 1.2 x  0.7 x 1.1 cm. Right parotid 1 cm lesion does not have typical appearance of an intra parotid lymph node and therefore primary parotid lesion is a possibility. ENT consultation may be considered for further delineation.  10-08-14: mri of cervical; spine: Scattered new areas of altered signal intensity (compared to 2007) throughout the cervical cord as detailed above consistent with patient's history of multiple sclerosis. None of these areas demonstrate enhancement as may be seen with areas of active demyelination. Cervical spondylotic changes as detailed above most prominent C5-6 level.  01-12-15: 2-d echo: Left ventricle: The cavity size was normal. There was mild concentric hypertrophy. Systolic function was normal. The estimated ejection fraction was in the range of 60% to 65%. Wall motion was normal; there were no regional wall motion abnormalities. Doppler parameters are consistent with abnormal left ventricular relaxation (grade 1 diastolic dysfunction). - Aortic valve: There was mild regurgitation. - Mitral valve: Calcified annulus. There was mild regurgitation.  07-14-15: EEG: This EEG is consistent with a mild generalized non-specific cerebral dysfunction(encephalopathy).   07-20-15: chest x-ray; Interval extubation. Decreasing lung volumes with increasing bibasilar opacities, likely atelectasis.  11-07-15: ct of head; No acute intracranial abnormality. White matter changes are suggestive for chronic small vessel ischemic disease. Right maxillary sinus disease.   NO NEW EXAMS   LABS  REVIEWED: PREVIOUS    08-09-16: chol 159; ldl 109; trig 109; hdl 28  09-10-16: wbc 4.9; hgb 10.5; hct 33.2; mcv 86.6; plt 253  glucose 164; bun 9.0; creat 1.12; k+ 3.8; na++ 14; liver normal albumin 3.8 chol 200; ldl 142; trig 144; hdl 29  11-20-16: urine culture: providencia stuartii: fosfomycin 3 gm 12-12-16: urine culture: providencia stuartii: rocephin  01-29-17: wbc 5.1; hgb 10.8; hct 32.1; mcv 85.2; plt 262; glucose 128; bun 17.1; creat 1.25; k+ 4.2; na++ 144; alk phos 138; albumin 3.9  02-27-17:wbc 6.5; hgb 9.5; hct 30.4; mcv 88.2; plt 254; glucose 123; bun 21; creat 1.40; k+ 4.8; na++ 141; ca 9.1; alk phos 137; albumin 3.6 04-02-17: wbc 7.2; hgb 11.7; hct 37.8; mv 88.7; plt 285; flu: A/B: neg    05-20-17: e-coli: cipro 05-31-17: wbc 5.9; hgb 10.2; hct 30.6; mcv 84.0; plt 292; glucose 123; bun 27.5; creat 1.57; k+ 4.0; na++141; ca 9.2; alk phos 145; albumin 4.0; chol 204; ldl 142; trig 155; hdl 33.  NO NEW LABS    Review of Systems  Constitutional: Negative for malaise/fatigue.  Respiratory: Negative for cough and shortness of breath.   Cardiovascular: Negative for chest pain, palpitations and leg swelling.  Gastrointestinal: Negative for abdominal pain, constipation and heartburn.  Musculoskeletal: Negative for back pain, joint pain and myalgias.  Skin: Negative.   Neurological: Negative for dizziness.  Psychiatric/Behavioral: The patient is not nervous/anxious.     Physical Exam  Constitutional: She is oriented to person, place, and time. She appears well-developed and well-nourished. No distress.  Obese   Neck: No thyromegaly present.  Cardiovascular: Normal rate, regular rhythm, normal heart sounds and intact distal pulses.  Pulmonary/Chest: Effort normal and breath sounds normal. No respiratory distress.  Abdominal: Soft. Bowel sounds are normal. She exhibits no distension. There is no tenderness.  Musculoskeletal: She exhibits no edema.  Limited movement of upper extremities   Unable to move lower extremities    Lymphadenopathy:    She has no cervical adenopathy.  Neurological: She is alert and oriented to person, place, and time.  Skin: Skin is warm and dry. She is not diaphoretic.  Psychiatric: She has a normal  mood and affect.    ASSESSMENT/ PLAN:  TODAY:   1. Glaucoma:  Is stable will continue xalatan to both eyes.    2. MS: is slowly declining   Has been  followed by  neurology;  Has paraplegia.will continue amantadine 100 mg daily baclofen 5 mg three times  for muscle spasms;   Has seen neurology on 09-26-16   3. Chronic pain: is stable:   will continue  neurontin 1000 mg twice daily; takes cymbalta 60 mg daily   tylenol 650 mg three times daily routinely and is using biofreeze to left shoulder four times daily   4. Chronic combined systolic and diastolic heart failure:EF is 60-65%(01-12-15)    is stable will continue demadex 20  mg daily; with k+ 20 meq daily; will continue aldactone 25 mg daily;     PREVIOUS    5. Constipation:  Is without change; will continue senna s twice daily and linzess 145 mcg daily   6. Dyslipidemia: stable will continue lipitor 10 mg daily  7. Allergic rhinitis: stable will continue claritin 10 mg daily and flonase daily   8. Depression: is presently stable  taking cymbalta 60 mg daily; which also helps with her pain management and  ambien 5  mg nightly for sleep   9 Hypertension b/p110/62 : is stable  will continue  coreg 25 mg twice daily and will monitor   10. Neurogenic bladder: no change in status  takes flomax 0.4 mg daily  myrbetriq 50 mg daily  Does not require I/o cath at this time.   34. Gerd: is stable  will continue pepcid 20 mg twice daily    MD is aware of resident's narcotic use and is in agreement with current plan of care. We will attempt to wean resident as apropriate   Ok Edwards NP [facility]  Contact [phone] Monday through Friday 8am- 5pm  After hours call [phone]

## 2017-07-13 LAB — HEPATIC FUNCTION PANEL
ALT: 12 (ref 7–35)
AST: 17 (ref 13–35)
Alkaline Phosphatase: 156 — AB (ref 25–125)
Bilirubin, Total: 0.3

## 2017-07-13 LAB — LIPID PANEL
CHOLESTEROL: 158 (ref 0–200)
HDL: 31 — AB (ref 35–70)
LDL Cholesterol: 94
TRIGLYCERIDES: 163 — AB (ref 40–160)

## 2017-08-27 LAB — CBC AND DIFFERENTIAL
HCT: 31 — AB (ref 36–46)
HEMATOCRIT: 31 — AB (ref 36–46)
HEMOGLOBIN: 10 — AB (ref 12.0–16.0)
Hemoglobin: 10 — AB (ref 12.0–16.0)
NEUTROS ABS: 4
Neutrophils Absolute: 4
Platelets: 291 (ref 150–399)
Platelets: 291 (ref 150–399)
WBC: 7.2
WBC: 7.2

## 2017-08-27 LAB — BASIC METABOLIC PANEL
BUN: 14 (ref 4–21)
Creatinine: 1.1 (ref <=1.1)
GLUCOSE: 173
POTASSIUM: 4.1 (ref 3.4–5.3)
SODIUM: 145 (ref 137–147)

## 2017-08-27 LAB — HEPATIC FUNCTION PANEL
ALK PHOS: 140 — AB (ref 25–125)
ALT: 15 (ref 7–35)
AST: 14 (ref 13–35)
BILIRUBIN, TOTAL: 0.2

## 2017-08-29 ENCOUNTER — Encounter: Payer: Self-pay | Admitting: Internal Medicine

## 2017-08-29 ENCOUNTER — Non-Acute Institutional Stay (SKILLED_NURSING_FACILITY): Payer: Medicare Other | Admitting: Internal Medicine

## 2017-08-29 DIAGNOSIS — R062 Wheezing: Secondary | ICD-10-CM

## 2017-08-29 DIAGNOSIS — G8220 Paraplegia, unspecified: Secondary | ICD-10-CM

## 2017-08-29 DIAGNOSIS — G894 Chronic pain syndrome: Secondary | ICD-10-CM | POA: Diagnosis not present

## 2017-08-29 DIAGNOSIS — G35 Multiple sclerosis: Secondary | ICD-10-CM

## 2017-08-29 DIAGNOSIS — I5042 Chronic combined systolic (congestive) and diastolic (congestive) heart failure: Secondary | ICD-10-CM | POA: Diagnosis not present

## 2017-08-29 DIAGNOSIS — G35D Multiple sclerosis, unspecified: Secondary | ICD-10-CM

## 2017-08-29 NOTE — Progress Notes (Signed)
Location:  [place] Room Number: [place] of Service:  SNF [phone]) Provider:  [place], [place], DO  Patient Care Team: Gildardo Cranker, DO as PCP - [facility] (Internal [facility]) [facility], [place] ([place])  Extended Emergency Contact Information Primary Emergency Contact: [place], [place] [address] [place] Phone: [phone] Relation: Sister Secondary Emergency Contact: Jefm Bryant, [place] Phone: [phone] Mobile Phone: [phone] Relation: Friend  Code Status:  DNR Goals of care: Advanced Directive information Advanced Directives 08/29/2017  Does Patient Have a Medical Advance Directive? Yes  Type of Advance Directive Out of facility DNR (pink MOST or yellow form)  Does patient want to make changes to medical advance directive? No - Patient declined  Copy of [place] in Chart? -  Would patient like information on creating a medical advance directive? -  Pre-existing out of facility DNR order (yellow form or pink MOST form) Pink MOST form placed in chart (order not valid for inpatient use)     Chief Complaint  Patient presents with  . Medical Management of Chronic Issues    Resident is being seen for a routine visit. - [facility]    HPI:  Pt is a 57 y.o. female long term resident seen today for medical management of chronic diseases.  She has chest congestion and wheezing. No new SOB or CP. No f/c. Appetite ok most days. No recent falls. Sleeps well.   Glaucoma - stable on xalatan gtts to both eyes.    MS with paraplegia - slowly declining;  Last seen by neurology Dr Felecia Shelling 09/26/16; takes amantadine 100 mg dail;y baclofen 5 mg three times for muscle spasms;    Chronic pain syndrome - stable on neurontin 1000 mg twice daily; cymbalta 60 mg daily; tylenol 650 mg three times daily; topical biofreeze to left  shoulder four times daily   Chronic combined systolic and diastolic heart failure - EF 60-65%(01-12-15)  Stable on demadex 20  mg daily with k+ 20 meq daily; aldactone 25 mg daily   Constipation - stable on senna s twice daily; linzess 145 mcg daily   Dyslipidemia - stable on lipitor 10 mg daily  Allergic rhinitis - stable on claritin 10 mg daily and flonase daily   Depression/insomnia - mood stable on cymbalta 60 mg daily; ambien 5  mg nightly for sleep   HTN - stable on coreg 25 mg twice daily  Neurogenic bladder - stable on flomax 0.4 mg daily;  myrbetriq 50 mg daily  Does not require I/o cath at this time.   GERD - stable on pepcid 20 mg twice daily   Past Medical History:  Diagnosis Date  . Cardiomyopathy ([place])    a.  Echo 04/29/12: Mild LVH, EF 20-25%, mild AI, moderate MR, moderate LAE, mild RAE, mild RVE, moderate TR, PASP 51, small pericardial effusion;   b. probably non-ischemic given multiple chemo-Tx agents used for MS and global LV dysfn on echo  . Chronic systolic heart failure ([place])   . Depression   . Glaucoma   . Hypertension   . MS (multiple sclerosis) ([place])    a. Dx'd late 20's. b. Tx with Novantrone, Tysabri, Copaxone previously.   Past Surgical History:  Procedure Laterality Date  . ABLATION     uterine  . CESAREAN  SECTION      Allergies  Allergen Reactions  . Sulfonamide Derivatives Hives and Shortness Of Breath  . Penicillins Hives and Itching    Has patient had a PCN reaction causing immediate rash, facial/tongue/throat swelling, SOB or lightheadedness with hypotension: unknown Has patient had a PCN reaction causing severe rash involving mucus membranes or skin necrosis: unknown Has patient had a PCN reaction that required hospitalization: unknown Has patient had a PCN reaction occurring within the last 10 years: unknown If all of the above answers are "NO", then may proceed with Cephalosporin use. Prior course of rocephin charted 05/2015     Outpatient Encounter Medications as of 08/29/2017  Medication Sig  . acetaminophen (TYLENOL) 325 MG tablet Take 650 mg by mouth 3 (three) times daily.  Marland Kitchen albuterol (PROVENTIL) (2.5 MG/3ML) 0.083% nebulizer solution Take 3 mLs (2.5 mg total) by nebulization every 2 (two) hours as needed for wheezing.  Marland Kitchen amantadine (SYMMETREL) 100 MG capsule Take 100 mg by mouth daily.   Marland Kitchen atorvastatin (LIPITOR) 10 MG tablet Take 10 mg by mouth at bedtime.  . Baclofen 5 MG TABS Take 1 tablet by mouth 3 (three) times daily. Hold if lethargic or confused  . carvedilol (COREG) 25 MG tablet Take 1 tablet (25 mg total) by mouth 2 (two) times daily with a meal.  . Chlorpheniramine-DM (CORICIDIN HBP COUGH/COLD PO) Take 1 tablet by mouth 3 (three) times daily. For nasal congestion and cough.  . Cholecalciferol (VITAMIN D-3 PO) Take 1,000 Int'l Units/day by mouth at bedtime.   Marland Kitchen CRANBERRY PO Take 100 mg by mouth at bedtime.   . DULoxetine (CYMBALTA) 60 MG capsule Take 60 mg by mouth daily.  . famotidine (PEPCID) 20 MG tablet Take 1 tablet (20 mg total) by mouth 2 (two) times daily.  . fluticasone (FLONASE) 50 MCG/ACT nasal spray Place 2 sprays into both nostrils every evening.  Marland Kitchen GABAPENTIN PO Give 1000 mg by mouth two times daily.  . hydrocortisone cream 0.5 % Apply to cheeks topically two times a day for dry areas, avoid eyes  . Lactase (LACTAID PO) Take 1 tablet by mouth at bedtime.   . Lactulose 20 GM/30ML SOLN Take 30 mLs by mouth daily as needed.   . latanoprost (XALATAN) 0.005 % ophthalmic solution Place 1 drop into both eyes at bedtime.  Marland Kitchen linaclotide (LINZESS) 145 MCG CAPS capsule Take 145 mcg by mouth daily before breakfast.   . loratadine (CLARITIN) 10 MG tablet Take 10 mg by mouth daily.  . magnesium hydroxide (MILK OF MAGNESIA) 400 MG/5ML suspension Take 30 mLs by mouth every 8 (eight) hours as needed for mild constipation or moderate constipation.  . Menthol, Topical Analgesic, (BIOFREEZE) 4 % GEL Apply  to left shoulder topically four times a day for pain  . mirabegron ER (MYRBETRIQ) 50 MG TB24 tablet Take 50 mg by mouth at bedtime.   . phenazopyridine (PYRIDIUM) 100 MG tablet Take 100 mg by mouth every 8 (eight) hours as needed for pain.  . potassium chloride (KLOR-CON) 20 MEQ packet Take 20 mEq by mouth daily.   Marland Kitchen Propylene Glycol (SYSTANE BALANCE) 0.6 % SOLN Instill 1 drop in both eyes two times a day for dry eyes  . senna-docusate (SENOKOT S) 8.6-50 MG tablet Take 2 tablets by mouth 2 (two) times daily.   . simethicone (MYLICON) 80 MG chewable tablet Chew 80 mg by mouth every 6 (six) hours as needed for flatulence.  Marland Kitchen spironolactone (ALDACTONE) 25 MG tablet Take 25 mg  by mouth daily. For CHF  . tamsulosin (FLOMAX) 0.4 MG CAPS capsule Take 0.4 mg by mouth daily. For incontinence  . torsemide (DEMADEX) 20 MG tablet Take 20 mg by mouth daily.  Marland Kitchen zolpidem (AMBIEN) 5 MG tablet Take 1 tablet (5 mg total) by mouth at bedtime.   No facility-administered encounter medications on file as of 08/29/2017.     Review of Systems  HENT: Positive for congestion.   Respiratory: Positive for wheezing.   Musculoskeletal: Positive for gait problem.  Neurological: Positive for weakness.  All other systems reviewed and are negative.   Immunization History  Administered Date(s) Administered  . Influenza,inj,Quad PF,6+ Mos 08/01/2013  . Influenza-Unspecified 08/31/2014, 08/09/2015, 05/19/2016, 05/01/2017  . PPD Test 03/23/2016, 03/30/2016  . Pneumococcal Polysaccharide-23 08/01/2013  . Pneumococcal-Unspecified 08/30/2013, 07/29/2014, 08/31/2014   Pertinent  Health Maintenance Due  Topic Date Due  . PAP SMEAR  08/29/2017 (Originally 02/04/2000)  . COLONOSCOPY  08/29/2017 (Originally 11/11/2012)  . MAMMOGRAM  09/21/2018  . INFLUENZA VACCINE  Completed   Fall Risk  01/17/2017  Falls in the past year? Yes  Number falls in past yr: 1  Injury with Fall? No   Functional Status Survey:    Vitals:    08/29/17 1244  BP: 138/70  Pulse: 72  Resp: 18  Temp: 98.4 F (36.9 C)  SpO2: 98%  Weight: 240 lb (108.9 kg)  Height: 5\' 5"  (1.651 m)   Body mass index is 39.94 kg/m. Physical Exam  Constitutional: She is oriented to person, place, and time. She appears well-developed and well-nourished.  Sitting in w/c in NAD, no conversational dyspnea  HENT:  Mouth/Throat: Oropharynx is clear and moist. No oropharyngeal exudate.  MMM; no oral thrush  Eyes: Pupils are equal, round, and reactive to light. No scleral icterus.  Neck: Neck supple. Carotid bruit is not present. No tracheal deviation present. No thyromegaly present.  Cardiovascular: Normal rate, regular rhythm and intact distal pulses. Exam reveals no gallop and no friction rub.  Murmur (1/6 SEM) heard. No LE edema b/l. no calf TTP.   Pulmonary/Chest: Effort normal. No stridor. No respiratory distress. She has wheezes (b/l with prolonged expiratory phase). She has no rales.  Abdominal: Soft. Normal appearance and bowel sounds are normal. She exhibits no distension and no mass. There is no hepatomegaly. There is no tenderness. There is no rigidity, no rebound and no guarding. No hernia.  obese  Musculoskeletal: She exhibits edema.  Lymphadenopathy:    She has no cervical adenopathy.  Neurological: She is alert and oriented to person, place, and time. Gait abnormal.  B/l LE and truncal reduced strength  Skin: Skin is warm and dry. No rash noted.  Psychiatric: She has a normal mood and affect. Her behavior is normal. Judgment and thought content normal.    Labs reviewed: Recent Labs    01/29/17 02/27/17 05/31/17  NA 144 141 141  K 4.2 4.8 4.0  BUN 17 21 28*  CREATININE 1.3* 1.4* 1.6*   Recent Labs    02/27/17 05/31/17 07/13/17  AST 26 16 17   ALT 28 12 12   ALKPHOS 137* 145* 156*   Recent Labs    04/02/17 05/31/17 06/03/17  WBC 7.2 5.9 5.0  NEUTROABS 6 4 3   HGB 11.7* 10.1* 10.5*  HCT 38 31* 32*  PLT 285 292 287   Lab  Results  Component Value Date   TSH 2.87 03/03/2016   Lab Results  Component Value Date   HGBA1C 5.7 06/04/2017  Lab Results  Component Value Date   CHOL 158 07/13/2017   HDL 31 (A) 07/13/2017   LDLCALC 94 07/13/2017   TRIG 163 (A) 07/13/2017   CHOLHDL 6.3 05/01/2012    Significant Diagnostic Results in last 30 days:  No results found.  Assessment/Plan   ICD-10-CM   1. Wheezing R06.2   2. Chronic combined systolic and diastolic congestive heart failure (HCC) I50.42   3. MULTIPLE SCLEROSIS, PROGRESSIVE/RELAPSING G35   4. Chronic pain syndrome G89.4   5. Paraplegia (HCC) G82.20    Cont nebs as ordered  Cont other meds as ordered  PT/OT/ST as indicated  F/u with neurology as indicated  [facility] NP to follow  Will follow  Labs/tests ordered: none   Amran Malter S. Perlie Gold  [facility] [address] [place], [place] [address] [phone] Cell (Monday-Friday 8 AM - 5 PM) [phone] After 5 PM and follow prompts

## 2017-09-02 ENCOUNTER — Encounter: Payer: Self-pay | Admitting: Internal Medicine

## 2017-09-04 LAB — HEPATIC FUNCTION PANEL
ALT: 12 (ref 7–35)
AST: 12 — AB (ref 13–35)
Alkaline Phosphatase: 146 — AB (ref 25–125)
Bilirubin, Total: 0.2

## 2017-09-04 LAB — CBC AND DIFFERENTIAL
HCT: 32 — AB (ref 36–46)
HEMOGLOBIN: 10.6 — AB (ref 12.0–16.0)
Neutrophils Absolute: 4
PLATELETS: 343 (ref 150–399)
WBC: 7.2

## 2017-09-04 LAB — BASIC METABOLIC PANEL
BUN: 10 (ref 4–21)
CREATININE: 1.1 (ref 0.5–1.1)
GLUCOSE: 130
POTASSIUM: 3.9 (ref 3.4–5.3)
Sodium: 142 (ref 137–147)

## 2017-09-10 LAB — BASIC METABOLIC PANEL
BUN: 12 (ref 4–21)
CREATININE: 1 (ref 0.5–1.1)
GLUCOSE: 153
Potassium: 4.2 (ref 3.4–5.3)
Sodium: 143 (ref 137–147)

## 2017-09-10 LAB — CBC AND DIFFERENTIAL
HCT: 36 (ref 36–46)
Hemoglobin: 11.5 — AB (ref 12.0–16.0)
NEUTROS ABS: 3
PLATELETS: 308 (ref 150–399)
WBC: 5.8

## 2017-09-11 ENCOUNTER — Ambulatory Visit (INDEPENDENT_AMBULATORY_CARE_PROVIDER_SITE_OTHER): Payer: Medicare Other | Admitting: Cardiology

## 2017-09-11 ENCOUNTER — Encounter: Payer: Self-pay | Admitting: Cardiology

## 2017-09-11 VITALS — BP 112/76 | HR 92 | Ht 65.0 in | Wt 241.0 lb

## 2017-09-11 DIAGNOSIS — I5042 Chronic combined systolic (congestive) and diastolic (congestive) heart failure: Secondary | ICD-10-CM | POA: Diagnosis not present

## 2017-09-11 DIAGNOSIS — I5032 Chronic diastolic (congestive) heart failure: Secondary | ICD-10-CM | POA: Diagnosis not present

## 2017-09-11 NOTE — Patient Instructions (Signed)
We will schedule you for an Echocardiogram  Continue your current therapy

## 2017-09-11 NOTE — Progress Notes (Signed)
Cardiology Office Note   Date:  09/11/2017   ID:  Shannon Garrett, DOB 03-Feb-1961, MRN [id number]  PCP:  Gildardo Cranker, DO  Cardiologist:   Peter Martinique, MD   Chief Complaint  Patient presents with  . Shortness of Breath  . Congestive Heart Failure      History of Present Illness: Shannon Garrett is a 57 y.o. female who is seen at the request of Dr. Eulas Post for evaluation of CHF.  She has a  hx of multiple sclerosis, HTN and depression. She is limited by her multiple sclerosis and is wheelchair bound. Currently living at [facility].  Echo in 6/12 demonstrated normal LVF. She had been treated with Novantrone, Tysabri, Copaxone, Symmetrel and Dimethyl Fumerate for her MS. She was admitted 04/2012 with new onset systolic CHF.  Echo 04/29/12: Mild LVH, EF 20-25%, mild AI, moderate MR, moderate LAE, mild RAE, mild RVE, moderate TR, PASP 51, small pericardial effusion. Overall it has been felt that her cardiomyopathy is likely nonischemic. She has been exposed to multiple chemotherapeutic agents in the past  which can cause CHF months to years after discontinuation. Medical therapy was recommended for CHF. Ischemic evaluation was not felt to be necessary. Her EF has improved with med Rx.  Echo 3/14: mild LVH, EF 40-98%, grade 1 diastolic dysfunction, mild AI, MAC, mild LAE.  Last seen here in August 2015. Echo in July 2016 was normal.   On follow up today she states she has been gaining a lot of weight and thinks this is related to water retention. She thinks she has gained 30-40 lbs in the last year but records indicate only a 5 lb weight gain. No increase in edema. She did have some chest pain when she had excessive coughing related to bronchitis. Mentions she might need a catheter for her bladder.     Past Medical History:  Diagnosis Date  . Cardiomyopathy ([place])    a.  Echo 04/29/12: Mild LVH, EF 20-25%, mild AI, moderate MR, moderate LAE, mild RAE, mild RVE, moderate TR, PASP 51,  small pericardial effusion;   b. probably non-ischemic given multiple chemo-Tx agents used for MS and global LV dysfn on echo  . Chronic systolic heart failure ([place])   . Depression   . Glaucoma   . Hypertension   . MS (multiple sclerosis) ([place])    a. Dx'd late 20's. b. Tx with Novantrone, Tysabri, Copaxone previously.    Past Surgical History:  Procedure Laterality Date  . ABLATION     uterine  . CESAREAN SECTION       Current Outpatient Medications  Medication Sig Dispense Refill  . acetaminophen (TYLENOL) 325 MG tablet Take 650 mg by mouth 3 (three) times daily.    Marland Kitchen albuterol (PROVENTIL) (2.5 MG/3ML) 0.083% nebulizer solution Take 3 mLs (2.5 mg total) by nebulization every 2 (two) hours as needed for wheezing. 75 mL 12  . amantadine (SYMMETREL) 100 MG capsule Take 100 mg by mouth daily.     Marland Kitchen atorvastatin (LIPITOR) 10 MG tablet Take 10 mg by mouth at bedtime.    . Baclofen 5 MG TABS Take 1 tablet by mouth 3 (three) times daily. Hold if lethargic or confused    . carvedilol (COREG) 25 MG tablet Take 1 tablet (25 mg total) by mouth 2 (two) times daily with a meal.    . Cholecalciferol (VITAMIN D-3 PO) Take 1,000 Int'l Units/day by mouth at bedtime.     Marland Kitchen CRANBERRY PO Take  100 mg by mouth at bedtime.     . DULoxetine (CYMBALTA) 60 MG capsule Take 60 mg by mouth daily.    . famotidine (PEPCID) 20 MG tablet Take 1 tablet (20 mg total) by mouth 2 (two) times daily.    . fluticasone (FLONASE) 50 MCG/ACT nasal spray Place 2 sprays into both nostrils every evening.    Marland Kitchen GABAPENTIN PO Give 1000 mg by mouth two times daily.    . hydrocortisone cream 0.5 % Apply to cheeks topically two times a day for dry areas, avoid eyes    . ipratropium-albuterol (DUONEB) 0.5-2.5 (3) MG/3ML SOLN Inhale 3 mLs into the lungs every 6 (six) hours.    . Lactase (LACTAID PO) Take 1 tablet by mouth at bedtime.     . Lactulose 20 GM/30ML SOLN Take 30 mLs by mouth daily as needed.     . latanoprost (XALATAN)  0.005 % ophthalmic solution Place 1 drop into both eyes at bedtime.    Marland Kitchen linaclotide (LINZESS) 145 MCG CAPS capsule Take 145 mcg by mouth daily before breakfast.     . loratadine (CLARITIN) 10 MG tablet Take 10 mg by mouth daily.    . magnesium hydroxide (MILK OF MAGNESIA) 400 MG/5ML suspension Take 30 mLs by mouth every 8 (eight) hours as needed for mild constipation or moderate constipation.    . Menthol, Topical Analgesic, (BIOFREEZE) 4 % GEL Apply to left shoulder topically four times a day for pain    . mirabegron ER (MYRBETRIQ) 50 MG TB24 tablet Take 50 mg by mouth at bedtime.     . phenazopyridine (PYRIDIUM) 100 MG tablet Take 100 mg by mouth every 8 (eight) hours as needed for pain.    . potassium chloride (KLOR-CON) 20 MEQ packet Take 20 mEq by mouth daily.     Marland Kitchen Propylene Glycol (SYSTANE BALANCE) 0.6 % SOLN Instill 1 drop in both eyes two times a day for dry eyes    . senna-docusate (SENOKOT S) 8.6-50 MG tablet Take 2 tablets by mouth 2 (two) times daily.     . simethicone (MYLICON) 80 MG chewable tablet Chew 80 mg by mouth every 6 (six) hours as needed for flatulence.    Marland Kitchen spironolactone (ALDACTONE) 25 MG tablet Take 25 mg by mouth daily. For CHF    . tamsulosin (FLOMAX) 0.4 MG CAPS capsule Take 0.4 mg by mouth daily. For incontinence    . torsemide (DEMADEX) 20 MG tablet Take 20 mg by mouth daily.    Marland Kitchen zolpidem (AMBIEN) 5 MG tablet Take 1 tablet (5 mg total) by mouth at bedtime. 30 tablet 0   No current facility-administered medications for this visit.     Allergies:   Sulfonamide derivatives and Penicillins    Social History:  The patient  reports that she has quit smoking. She has a 2.50 pack-year smoking history. she has never used smokeless tobacco. She reports that she does not drink alcohol or use drugs.   Family History:  The patient's family history includes Cancer in her father and mother; Hypertension in her mother; Multiple sclerosis in her sister.    ROS:  Please  see the history of present illness.   Otherwise, review of systems are positive for none.   All other systems are reviewed and negative.    PHYSICAL EXAM: VS:  BP 112/76   Pulse 92   Ht 5' 5"  (1.651 m)   Wt 241 lb (109.3 kg)   BMI 40.10 kg/m  , BMI  Body mass index is 40.1 kg/m. GEN: Well nourished, obese, in no acute distress - seen in wheelchair.  HEENT: normal  Neck: no JVD, carotid bruits, or masses Cardiac: RRR; no murmurs, rubs, or gallops,no edema  Respiratory:  clear to auscultation bilaterally, normal work of breathing GI: soft, nontender, nondistended, + BS MS: no deformity or atrophy  Skin: warm and dry, no rash Neuro:  Strength and sensation are intact Psych: euthymic mood, full affect   EKG:  EKG is ordered today. The ekg ordered today demonstrates NSR with rate 92. Normal Ecg. I have personally reviewed and interpreted this study.    Recent Labs: 05/31/2017: BUN 28; Creatinine 1.6; Potassium 4.0; Sodium 141 07/13/2017: ALT 12 08/27/2017: Hemoglobin 10.0; Platelets 291    Lipid Panel    Component Value Date/Time   CHOL 158 07/13/2017   TRIG 163 (A) 07/13/2017   HDL 31 (A) 07/13/2017   CHOLHDL 6.3 05/01/2012 0500   VLDL 14 05/01/2012 0500   LDLCALC 94 07/13/2017      Wt Readings from Last 3 Encounters:  09/11/17 241 lb (109.3 kg)  08/29/17 240 lb (108.9 kg)  07/01/17 241 lb (109.3 kg)      Other studies Reviewed: Additional studies/ records that were reviewed today include:   Echo 01/12/15: Study Conclusions  - Left ventricle: The cavity size was normal. There was mild   concentric hypertrophy. Systolic function was normal. The   estimated ejection fraction was in the range of 60% to 65%. Wall   motion was normal; there were no regional wall motion   abnormalities. Doppler parameters are consistent with abnormal   left ventricular relaxation (grade 1 diastolic dysfunction). - Aortic valve: There was mild regurgitation. - Mitral valve:  Calcified annulus. There was mild regurgitation.  Impressions:  - When compared to prior echocardiogram, EF has increased.   ASSESSMENT AND PLAN:  1. Chronic diastolic CHF. Prior history of nonischemic cardiomyopathy. EF as low as 20-25% in 2012 but has since recovered. By exam she has no evidence of volume overload. She is currently on Coreg, spironolactone, and demadex. Will update Echo. If EF still normal I would continue current medication. Patient reports lab work done by primary care. If renal insufficiency is an issue could consider reduction of demadex. Patient would be in favor of this.    Current medicines are reviewed at length with the patient today.  The patient does not have concerns regarding medicines.  The following changes have been made:  no change  Labs/ tests ordered today include:   Orders Placed This Encounter  Procedures  . EKG 12-Lead  . ECHOCARDIOGRAM COMPLETE     Disposition:   TBD. If Echo is normal will see PRN.  Signed, Peter Martinique, MD  09/11/2017 2:45 PM    [facility] [address] [place], [place], [address] Phone [phone], Fax [phone]

## 2017-09-20 ENCOUNTER — Other Ambulatory Visit: Payer: Self-pay

## 2017-09-20 ENCOUNTER — Ambulatory Visit (HOSPITAL_COMMUNITY): Payer: Medicare Other | Attending: Cardiovascular Disease

## 2017-09-20 DIAGNOSIS — G35 Multiple sclerosis: Secondary | ICD-10-CM | POA: Insufficient documentation

## 2017-09-20 DIAGNOSIS — I5042 Chronic combined systolic (congestive) and diastolic (congestive) heart failure: Secondary | ICD-10-CM | POA: Diagnosis not present

## 2017-09-20 DIAGNOSIS — I110 Hypertensive heart disease with heart failure: Secondary | ICD-10-CM | POA: Insufficient documentation

## 2017-09-20 DIAGNOSIS — R0600 Dyspnea, unspecified: Secondary | ICD-10-CM | POA: Diagnosis not present

## 2017-09-20 DIAGNOSIS — I5032 Chronic diastolic (congestive) heart failure: Secondary | ICD-10-CM

## 2017-09-20 DIAGNOSIS — F329 Major depressive disorder, single episode, unspecified: Secondary | ICD-10-CM | POA: Insufficient documentation

## 2017-09-20 DIAGNOSIS — I509 Heart failure, unspecified: Secondary | ICD-10-CM | POA: Diagnosis present

## 2017-09-20 DIAGNOSIS — I351 Nonrheumatic aortic (valve) insufficiency: Secondary | ICD-10-CM | POA: Insufficient documentation

## 2017-09-20 DIAGNOSIS — I429 Cardiomyopathy, unspecified: Secondary | ICD-10-CM | POA: Insufficient documentation

## 2017-09-20 NOTE — Progress Notes (Unsigned)
Patient would not agree to the use of Definity.

## 2017-09-26 ENCOUNTER — Telehealth: Payer: Self-pay | Admitting: Cardiology

## 2017-09-26 NOTE — Telephone Encounter (Signed)
New message    Female caller : you are calling the wrong number for this patient . ( I took the number that the caller gave me off of the account)  You may want to try the other number.

## 2017-09-26 NOTE — Telephone Encounter (Signed)
   Routed to [place], [place]

## 2017-09-27 NOTE — Telephone Encounter (Signed)
Called patient no answer.[facility] for echo results.

## 2017-09-30 NOTE — Telephone Encounter (Signed)
Echo results mailed to patient

## 2017-10-21 ENCOUNTER — Non-Acute Institutional Stay (SKILLED_NURSING_FACILITY): Payer: Medicare Other | Admitting: Internal Medicine

## 2017-10-21 ENCOUNTER — Encounter: Payer: Self-pay | Admitting: Internal Medicine

## 2017-10-21 DIAGNOSIS — G35 Multiple sclerosis: Secondary | ICD-10-CM | POA: Diagnosis not present

## 2017-10-21 DIAGNOSIS — J301 Allergic rhinitis due to pollen: Secondary | ICD-10-CM | POA: Diagnosis not present

## 2017-10-21 DIAGNOSIS — G894 Chronic pain syndrome: Secondary | ICD-10-CM | POA: Diagnosis not present

## 2017-10-21 DIAGNOSIS — R062 Wheezing: Secondary | ICD-10-CM | POA: Diagnosis not present

## 2017-10-21 DIAGNOSIS — I10 Essential (primary) hypertension: Secondary | ICD-10-CM

## 2017-10-21 DIAGNOSIS — G8220 Paraplegia, unspecified: Secondary | ICD-10-CM

## 2017-10-21 NOTE — Progress Notes (Signed)
Patient ID: Shannon Garrett, female   DOB: April 07, 1961, 57 y.o.   MRN: [id number]  Location:  [facility] Room Number: 230 B Place of Service:  SNF [phone]) Provider:  [place], [place], DO  Patient Care Team: Gildardo Cranker, DO as PCP - General (Internal Medicine) Center, [place] ([place])  Extended Emergency Contact Information Primary Emergency Contact: [place], [place] [address] [place] Phone: [phone] Relation: Sister Secondary Emergency Contact: Jefm Bryant, [place] Phone: [phone] Mobile Phone: [phone] Relation: Friend  Code Status:  DNR Goals of care: Advanced Directive information Advanced Directives 10/21/2017  Does Patient Have a Medical Advance Directive? Yes  Type of Advance Directive Out of facility DNR (pink MOST or yellow form)  Does patient want to make changes to medical advance directive? No - Patient declined  Copy of [place] in Chart? -  Would patient like information on creating a medical advance directive? -  Pre-existing out of facility DNR order (yellow form or pink MOST form) Pink MOST form placed in chart (order not valid for inpatient use)     Chief Complaint  Patient presents with  . Medical Management of Chronic Issues    [facility]    HPI:  Pt is a 57 y.o. female seen today for medical management of chronic diseases.  She is c/a her breathing and would like an incentive spirometry. No CP. No cough. No f/c. Appetite ok and sleeps well most nights. No nursing issues.  Glaucoma - stable on xalatan gtts to both eyes.     MS with paraplegia - slowly declining;  Last seen by neurology Dr Felecia Shelling 09/26/16; currently stable on amantadine 100 mg dail;y baclofen 5 mg three times for muscle spasms;    Chronic pain syndrome - controlled on neurontin 1000 mg twice daily; cymbalta 60 mg daily;  tylenol 650 mg three times daily; topical biofreeze to left shoulder four times daily   Chronic combined systolic and diastolic heart failure - EF 60-65%(01-12-15); sx's managed on demadex 20  mg daily with k+ 20 meq daily; aldactone 25 mg daily   Constipation - stable on senna s twice daily; linzess 145 mcg daily   Dyslipidemia - stable on lipitor 10 mg daily; LDL 94  Allergic rhinitis - stable on claritin 10 mg daily and flonase daily   Depression/insomnia - mood stable on cymbalta 60 mg daily; ambien 5 mg nightly for sleep. She does benefit from this regimen.   HTN - controlled on coreg 25 mg twice daily  Neurogenic bladder - controlled on flomax 0.4 mg daily;  myrbetriq 50 mg daily  Does not require I/o cath at this time.   GERD - stable on pepcid 20 mg twice daily   Past Medical History:  Diagnosis Date  . Cardiomyopathy ([place])    a.  Echo 04/29/12: Mild LVH, EF 20-25%, mild AI, moderate MR, moderate LAE, mild RAE, mild RVE, moderate TR, PASP 51, small pericardial effusion;   b. probably non-ischemic given multiple chemo-Tx agents used for MS and global LV dysfn on echo  . Chronic systolic heart failure ([place])   . Depression   . Glaucoma   . Hypertension   . MS (multiple sclerosis) ([place])    a. Dx'd late 20's. b. Tx with Novantrone, Tysabri, Copaxone previously.   Past  Surgical History:  Procedure Laterality Date  . ABLATION     uterine  . CESAREAN SECTION      Allergies  Allergen Reactions  . Sulfonamide Derivatives Hives and Shortness Of Breath  . Penicillins Hives and Itching    Has patient had a PCN reaction causing immediate rash, facial/tongue/throat swelling, SOB or lightheadedness with hypotension: unknown Has patient had a PCN reaction causing severe rash involving mucus membranes or skin necrosis: unknown Has patient had a PCN reaction that required hospitalization: unknown Has patient had a PCN reaction occurring within the last 10 years: unknown If all of the  above answers are "NO", then may proceed with Cephalosporin use. Prior course of rocephin charted 05/2015    Outpatient Encounter Medications as of 10/21/2017  Medication Sig  . acetaminophen (TYLENOL) 325 MG tablet Take 650 mg by mouth 3 (three) times daily.  Marland Kitchen albuterol (PROVENTIL) (2.5 MG/3ML) 0.083% nebulizer solution Take 3 mLs (2.5 mg total) by nebulization every 2 (two) hours as needed for wheezing.  Marland Kitchen amantadine (SYMMETREL) 100 MG capsule Take 100 mg by mouth daily.   Marland Kitchen atorvastatin (LIPITOR) 10 MG tablet Take 10 mg by mouth at bedtime.  . Baclofen 5 MG TABS Take 1 tablet by mouth 3 (three) times daily. Hold if lethargic or confused  . carvedilol (COREG) 25 MG tablet Take 1 tablet (25 mg total) by mouth 2 (two) times daily with a meal.  . Cholecalciferol (VITAMIN D-3 PO) Take 1,000 Int'l Units/day by mouth at bedtime.   Marland Kitchen CRANBERRY PO Take 100 mg by mouth at bedtime.   . DULoxetine (CYMBALTA) 60 MG capsule Take 60 mg by mouth daily.  . famotidine (PEPCID) 20 MG tablet Take 1 tablet (20 mg total) by mouth 2 (two) times daily.  . fluticasone (FLONASE) 50 MCG/ACT nasal spray Place 2 sprays into both nostrils every evening.  Marland Kitchen GABAPENTIN PO Give 1000 mg by mouth two times daily.  . hydrocortisone cream 0.5 % Apply to cheeks topically two times a day for dry areas, avoid eyes  . ipratropium-albuterol (DUONEB) 0.5-2.5 (3) MG/3ML SOLN Inhale 3 mLs into the lungs every 6 (six) hours.  . Lactase (LACTAID PO) Take 1 tablet by mouth at bedtime.   . Lactulose 20 GM/30ML SOLN Take 30 mLs by mouth daily as needed.   . latanoprost (XALATAN) 0.005 % ophthalmic solution Place 1 drop into both eyes at bedtime.  Marland Kitchen linaclotide (LINZESS) 145 MCG CAPS capsule Take 145 mcg by mouth daily before breakfast.   . loratadine (CLARITIN) 10 MG tablet Take 10 mg by mouth daily.  . magnesium hydroxide (MILK OF MAGNESIA) 400 MG/5ML suspension Take 30 mLs by mouth every 8 (eight) hours as needed for mild constipation  or moderate constipation.  . Melatonin 3 MG TABS Take 1 tablet by mouth at bedtime.  . Menthol-Methyl Salicylate (MUSCLE RUB) 10-15 % CREA Apply to shoulders topically two times a day for pain  . mirabegron ER (MYRBETRIQ) 50 MG TB24 tablet Take 50 mg by mouth at bedtime.   . phenazopyridine (PYRIDIUM) 100 MG tablet Take 100 mg by mouth every 8 (eight) hours as needed for pain.  . potassium chloride (KLOR-CON) 20 MEQ packet Take 20 mEq by mouth daily.   Marland Kitchen Propylene Glycol (SYSTANE BALANCE) 0.6 % SOLN Instill 1 drop in both eyes two times a day for dry eyes  . senna-docusate (SENOKOT S) 8.6-50 MG tablet Take 2 tablets by mouth 2 (two) times daily.   . simethicone (MYLICON) 80  MG chewable tablet Chew 80 mg by mouth every 6 (six) hours as needed for flatulence.  Marland Kitchen spironolactone (ALDACTONE) 25 MG tablet Take 25 mg by mouth daily. For CHF  . tamsulosin (FLOMAX) 0.4 MG CAPS capsule Take 0.4 mg by mouth daily. For incontinence  . torsemide (DEMADEX) 20 MG tablet Take 20 mg by mouth daily.  Marland Kitchen zolpidem (AMBIEN) 5 MG tablet Take 1 tablet (5 mg total) by mouth at bedtime.  . [DISCONTINUED] Menthol, Topical Analgesic, (BIOFREEZE) 4 % GEL Apply to left shoulder topically four times a day for pain   No facility-administered encounter medications on file as of 10/21/2017.     Review of Systems  Respiratory: Positive for shortness of breath.   Musculoskeletal: Positive for arthralgias and gait problem.  Neurological: Positive for weakness.  All other systems reviewed and are negative.   Immunization History  Administered Date(s) Administered  . Influenza,inj,Quad PF,6+ Mos 08/01/2013  . Influenza-Unspecified 08/31/2014, 08/09/2015, 05/19/2016, 05/01/2017  . PPD Test 03/23/2016, 03/30/2016  . Pneumococcal Polysaccharide-23 08/01/2013  . Pneumococcal-Unspecified 08/30/2013, 07/29/2014, 08/31/2014   Pertinent  Health Maintenance Due  Topic Date Due  . INFLUENZA VACCINE  01/30/2018  . MAMMOGRAM   09/21/2018  . PAP SMEAR  Discontinued  . COLONOSCOPY  Discontinued   Fall Risk  01/17/2017  Falls in the past year? Yes  Number falls in past yr: 1  Injury with Fall? No   Functional Status Survey:    Vitals:   10/21/17 1122  BP: 114/80  Pulse: 91  Resp: 20  Temp: 97.9 F (36.6 C)  SpO2: 91%  Weight: 238 lb (108 kg)  Height: 5\' 5"  (1.651 m)   Body mass index is 39.61 kg/m. Physical Exam  Constitutional: She is oriented to person, place, and time. She appears well-developed and well-nourished.  Frail appearing in NAD, sitting up in bed; no conversational dyspnea  HENT:  Mouth/Throat: Oropharynx is clear and moist. No oropharyngeal exudate.  MMM; no oral thrush  Eyes: Pupils are equal, round, and reactive to light. No scleral icterus.  Neck: Neck supple. Carotid bruit is not present. No tracheal deviation present. No thyromegaly present.  Cardiovascular: Normal rate, regular rhythm and intact distal pulses. Exam reveals no gallop and no friction rub.  Murmur (1/6 SEM) heard. Trace LE edema b/l. No calf TTP  Pulmonary/Chest: Effort normal. No stridor. No respiratory distress. She has decreased breath sounds. She has wheezes (end expiratory). She has no rales.  Abdominal: Soft. Normal appearance and bowel sounds are normal. She exhibits no distension and no mass. There is no hepatomegaly. There is no tenderness. There is no rigidity, no rebound and no guarding. A hernia (large ventral, nonreducible, NT) is present.  obese  Musculoskeletal: She exhibits edema.  Lymphadenopathy:    She has no cervical adenopathy.  Neurological: She is alert and oriented to person, place, and time. She has normal reflexes. She displays tremor (resting).  (+) limb jerks; paraplegic  Skin: Skin is warm and dry. No rash noted.  Psychiatric: She has a normal mood and affect. Her behavior is normal. Judgment and thought content normal.    Labs reviewed: Recent Labs    01/29/17 02/27/17 05/31/17    NA 144 141 141  K 4.2 4.8 4.0  BUN 17 21 28*  CREATININE 1.3* 1.4* 1.6*   Recent Labs    02/27/17 05/31/17 07/13/17  AST 26 16 17   ALT 28 12 12   ALKPHOS 137* 145* 156*   Recent Labs  05/31/17 06/03/17 08/27/17  WBC 5.9 5.0 7.2  NEUTROABS 4 3 4   HGB 10.1* 10.5* 10.0*  HCT 31* 32* 31*  PLT 292 287 291   Lab Results  Component Value Date   TSH 2.87 03/03/2016   Lab Results  Component Value Date   HGBA1C 5.7 06/04/2017   Lab Results  Component Value Date   CHOL 158 07/13/2017   HDL 31 (A) 07/13/2017   LDLCALC 94 07/13/2017   TRIG 163 (A) 07/13/2017   CHOLHDL 6.3 05/01/2012    Significant Diagnostic Results in last 30 days:  No results found.  Assessment/Plan   ICD-10-CM   1. Wheezing R06.2   2. Allergic rhinitis due to pollen, unspecified seasonality J30.1   3. MULTIPLE SCLEROSIS, PROGRESSIVE/RELAPSING G35   4. Paraplegia (HCC) G82.20   5. Chronic pain syndrome G89.4   6. Essential hypertension I10      Incentive spirometry at bedside to use 10 times per day  Cont current meds as ordered  PT/OT/ST as indicated  F/u with specialists as scheduled  Will follow  Labs/tests ordered: none   Ronnita Paz S. Perlie Gold  [facility] [address] [place], [place] [address] [phone] Cell (Monday-Friday 8 AM - 5 PM) [phone] After 5 PM and follow prompts

## 2017-10-31 LAB — CBC AND DIFFERENTIAL
HCT: 34 — AB (ref 36–46)
HEMOGLOBIN: 11.4 — AB (ref 12.0–16.0)
NEUTROS ABS: 6
Platelets: 303 (ref 150–399)
WBC: 7.8

## 2017-10-31 LAB — BASIC METABOLIC PANEL
BUN: 12 (ref 4–21)
CREATININE: 1 (ref 0.5–1.1)
GLUCOSE: 125
POTASSIUM: 4.9 (ref 3.4–5.3)
SODIUM: 145 (ref 137–147)

## 2017-10-31 LAB — HEPATIC FUNCTION PANEL
ALK PHOS: 142 — AB (ref 25–125)
ALT: 15 (ref 7–35)
AST: 15 (ref 13–35)
Bilirubin, Total: 0.2

## 2017-12-06 ENCOUNTER — Other Ambulatory Visit: Payer: Self-pay | Admitting: Neurology

## 2017-12-09 ENCOUNTER — Non-Acute Institutional Stay (SKILLED_NURSING_FACILITY): Payer: Medicare Other | Admitting: Internal Medicine

## 2017-12-09 ENCOUNTER — Encounter: Payer: Self-pay | Admitting: Internal Medicine

## 2017-12-09 DIAGNOSIS — Z9189 Other specified personal risk factors, not elsewhere classified: Secondary | ICD-10-CM

## 2017-12-09 DIAGNOSIS — G8220 Paraplegia, unspecified: Secondary | ICD-10-CM

## 2017-12-09 DIAGNOSIS — G35D Multiple sclerosis, unspecified: Secondary | ICD-10-CM

## 2017-12-09 DIAGNOSIS — Z1159 Encounter for screening for other viral diseases: Secondary | ICD-10-CM

## 2017-12-09 DIAGNOSIS — G894 Chronic pain syndrome: Secondary | ICD-10-CM

## 2017-12-09 DIAGNOSIS — J301 Allergic rhinitis due to pollen: Secondary | ICD-10-CM

## 2017-12-09 DIAGNOSIS — Z6841 Body Mass Index (BMI) 40.0 and over, adult: Secondary | ICD-10-CM

## 2017-12-09 DIAGNOSIS — G35 Multiple sclerosis: Secondary | ICD-10-CM | POA: Diagnosis not present

## 2017-12-09 DIAGNOSIS — I5042 Chronic combined systolic (congestive) and diastolic (congestive) heart failure: Secondary | ICD-10-CM

## 2017-12-09 DIAGNOSIS — R739 Hyperglycemia, unspecified: Secondary | ICD-10-CM | POA: Diagnosis not present

## 2017-12-09 NOTE — Progress Notes (Signed)
Location:  [facility] Room Number: [place] of Service:  SNF [phone]) Provider:  [place], Narcissa, DO  Patient Care Team: Gildardo Cranker, DO as PCP - General (Internal Medicine) Center, [place] ([place])  Extended Emergency Contact Information Primary Emergency Contact: Tool, [place] [address] [place] Phone: [phone] Relation: Sister Secondary Emergency Contact: Jefm Bryant, [place] Phone: [phone] Mobile Phone: [phone] Relation: Friend  Code Status:  DNR Goals of care: Advanced Directive information Advanced Directives 12/09/2017  Does Patient Have a Medical Advance Directive? Yes  Type of Advance Directive Out of facility DNR (pink MOST or yellow form)  Does patient want to make changes to medical advance directive? No - Patient declined  Copy of [place] in Chart? -  Would patient like information on creating a medical advance directive? -  Pre-existing out of facility DNR order (yellow form or pink MOST form) Pink MOST form placed in chart (order not valid for inpatient use)     Chief Complaint  Patient presents with  . Medical Management of Chronic Issues    [facility]    HPI:  Pt is a 58 y.o. female seen today for medical management of chronic diseases.  She wonders if she can have a drink of Etoh once in a while. Discouraged pt from doing so due to current meds she is taking and possible interaction.  Glaucoma - stable on xalatan gtts to both eyes.    MS with paraplegia - slowly declining;  Last seen by neurology Dr Felecia Shelling 09/26/16; takes amantadine 100 mg dail;y baclofen 5 mg three times for muscle spasms;    Chronic pain syndrome - stable on neurontin 1000 mg twice daily; cymbalta 60 mg daily; tylenol 650 mg three times daily; topical biofreeze to left shoulder four times daily    Chronic combined systolic and diastolic heart failure - EF 60-65%(01-12-15)  Stable on demadex 20  mg daily with k+ 20 meq daily; aldactone 25 mg daily   Constipation - stable on senna s twice daily; linzess 145 mcg daily   Dyslipidemia - stable on lipitor 10 mg daily  Allergic rhinitis - stable on claritin 10 mg daily and flonase daily   Depression/insomnia - mood stable on cymbalta 60 mg daily; ambien 5  mg nightly for sleep   HTN - stable on coreg 25 mg twice daily  Neurogenic bladder - stable on flomax 0.4 mg daily;  myrbetriq 50 mg daily  Does not require I/o cath at this time.   GERD - stable on pepcid 20 mg twice daily     Past Medical History:  Diagnosis Date  . Cardiomyopathy ([place])    a.  Echo 04/29/12: Mild LVH, EF 20-25%, mild AI, moderate MR, moderate LAE, mild RAE, mild RVE, moderate TR, PASP 51, small pericardial effusion;   b. probably non-ischemic given multiple chemo-Tx agents used for MS and global LV dysfn on echo  . Chronic systolic heart failure ([place])   . Depression   . Glaucoma   . Hypertension   . MS (multiple sclerosis) ([place])    a. Dx'd late 20's. b. Tx with Novantrone, Tysabri, Copaxone previously.   Past Surgical History:  Procedure Laterality Date  . ABLATION     uterine  . CESAREAN SECTION  Allergies  Allergen Reactions  . Sulfonamide Derivatives Hives and Shortness Of Breath  . Penicillins Hives and Itching    Has patient had a PCN reaction causing immediate rash, facial/tongue/throat swelling, SOB or lightheadedness with hypotension: unknown Has patient had a PCN reaction causing severe rash involving mucus membranes or skin necrosis: unknown Has patient had a PCN reaction that required hospitalization: unknown Has patient had a PCN reaction occurring within the last 10 years: unknown If all of the above answers are "NO", then may proceed with Cephalosporin use. Prior course of rocephin charted 05/2015    Outpatient Encounter  Medications as of 12/09/2017  Medication Sig  . acetaminophen (TYLENOL) 325 MG tablet Take 650 mg by mouth 3 (three) times daily.  Marland Kitchen albuterol (PROVENTIL) (2.5 MG/3ML) 0.083% nebulizer solution Take 3 mLs (2.5 mg total) by nebulization every 2 (two) hours as needed for wheezing.  Marland Kitchen amantadine (SYMMETREL) 100 MG capsule Take 100 mg by mouth daily.   Marland Kitchen atorvastatin (LIPITOR) 10 MG tablet Take 10 mg by mouth at bedtime.  . Baclofen 5 MG TABS Take 1 tablet by mouth 4 (four) times daily. Hold if lethargic or confused   . carvedilol (COREG) 25 MG tablet Take 1 tablet (25 mg total) by mouth 2 (two) times daily with a meal.  . Cholecalciferol (VITAMIN D-3 PO) Take 1,000 Int'l Units/day by mouth at bedtime.   Marland Kitchen CRANBERRY PO Take 100 mg by mouth at bedtime.   . DULoxetine (CYMBALTA) 60 MG capsule Take 60 mg by mouth daily.  . famotidine (PEPCID) 20 MG tablet Take 1 tablet (20 mg total) by mouth 2 (two) times daily.  . fluticasone (FLONASE) 50 MCG/ACT nasal spray Place 2 sprays into both nostrils every evening.  Marland Kitchen GABAPENTIN PO Give 1000 mg by mouth two times daily.  . hydrocortisone cream 0.5 % Apply to cheeks topically two times a day for dry areas, avoid eyes  . ipratropium-albuterol (DUONEB) 0.5-2.5 (3) MG/3ML SOLN Inhale 3 mLs into the lungs every 6 (six) hours.  . Lactase (LACTAID PO) Take 1 tablet by mouth at bedtime.   . Lactulose 20 GM/30ML SOLN Take 30 mLs by mouth daily as needed.   . latanoprost (XALATAN) 0.005 % ophthalmic solution Place 1 drop into both eyes at bedtime.  Marland Kitchen linaclotide (LINZESS) 290 MCG CAPS capsule Take 290 mcg by mouth daily before breakfast.   . loratadine (CLARITIN) 10 MG tablet Take 10 mg by mouth daily.  . magnesium hydroxide (MILK OF MAGNESIA) 400 MG/5ML suspension Take 30 mLs by mouth every 8 (eight) hours as needed for mild constipation or moderate constipation.  . Melatonin 5 MG TABS Take 2 tablets by mouth at bedtime.   . Menthol-Methyl Salicylate (MUSCLE RUB)  10-15 % CREA Apply to shoulders topically two times a day for pain  . mirabegron ER (MYRBETRIQ) 50 MG TB24 tablet Take 50 mg by mouth at bedtime.   . ondansetron (ZOFRAN) 4 MG tablet Take 4 mg by mouth every 6 (six) hours as needed for nausea or vomiting.  . phenazopyridine (PYRIDIUM) 100 MG tablet Take 100 mg by mouth every 8 (eight) hours as needed for pain.  . polyethylene glycol (MIRALAX / GLYCOLAX) packet Take 17 g by mouth daily.  . potassium chloride (KLOR-CON) 20 MEQ packet Take 20 mEq by mouth daily.   Marland Kitchen Propylene Glycol (SYSTANE BALANCE) 0.6 % SOLN Instill 1 drop in both eyes two times a day for dry eyes  . senna-docusate (SENOKOT S) 8.6-50 MG tablet  Take 2 tablets by mouth 2 (two) times daily.   . simethicone (MYLICON) 80 MG chewable tablet Chew 80 mg by mouth every 6 (six) hours as needed for flatulence.  Marland Kitchen spironolactone (ALDACTONE) 25 MG tablet Take 25 mg by mouth daily. For CHF  . tamsulosin (FLOMAX) 0.4 MG CAPS capsule Take 0.4 mg by mouth daily. For incontinence  . torsemide (DEMADEX) 20 MG tablet Take 20 mg by mouth daily.  Marland Kitchen zolpidem (AMBIEN) 5 MG tablet Take 1 tablet (5 mg total) by mouth at bedtime.   No facility-administered encounter medications on file as of 12/09/2017.     Review of Systems  Respiratory: Positive for wheezing.   Musculoskeletal: Positive for arthralgias and gait problem.  Neurological: Positive for weakness.  All other systems reviewed and are negative.   Immunization History  Administered Date(s) Administered  . Influenza,inj,Quad PF,6+ Mos 08/01/2013  . Influenza-Unspecified 08/31/2014, 08/09/2015, 05/19/2016, 05/01/2017  . PPD Test 03/23/2016, 03/30/2016  . Pneumococcal Polysaccharide-23 08/01/2013  . Pneumococcal-Unspecified 08/30/2013, 07/29/2014, 08/31/2014   Pertinent  Health Maintenance Due  Topic Date Due  . INFLUENZA VACCINE  01/30/2018  . MAMMOGRAM  09/21/2018  . PAP SMEAR  Discontinued  . COLONOSCOPY  Discontinued   Fall Risk   01/17/2017  Falls in the past year? Yes  Number falls in past yr: 1  Injury with Fall? No   Functional Status Survey:    Vitals:   12/09/17 1227  BP: 120/64  Pulse: 68  Resp: 18  Temp: 97.6 F (36.4 C)  SpO2: 98%  Weight: 249 lb (112.9 kg)  Height: 5\' 5"  (1.651 m)   Body mass index is 41.44 kg/m. Physical Exam  Constitutional: She is oriented to person, place, and time. She appears well-developed and well-nourished.  Looks well sitting in w/c in NAD  HENT:  Mouth/Throat: Oropharynx is clear and moist. No oropharyngeal exudate.  MMM; no oral thrush  Eyes: Pupils are equal, round, and reactive to light. No scleral icterus.  Neck: Neck supple. Carotid bruit is not present. No tracheal deviation present. No thyromegaly present.  Cardiovascular: Normal rate, regular rhythm and intact distal pulses. Exam reveals no gallop and no friction rub.  Murmur (1/6 SEM) heard. Trace L>RLE edema. No calf TTP  Pulmonary/Chest: Effort normal. No stridor. No respiratory distress. She has wheezes ((+) scattered end-expiratory). She has no rales.  Abdominal: Soft. Normal appearance and bowel sounds are normal. She exhibits no distension and no mass. There is no hepatomegaly. There is no tenderness. There is no rigidity, no rebound and no guarding. No hernia.  obese  Musculoskeletal: She exhibits edema.  Lymphadenopathy:    She has no cervical adenopathy.  Neurological: She is alert and oriented to person, place, and time. She has normal reflexes.  paraplegia  Skin: Skin is warm and dry. No rash noted.  Psychiatric: She has a normal mood and affect. Her behavior is normal. Judgment and thought content normal.    Labs reviewed: Recent Labs    09/04/17 09/10/17 10/31/17  NA 142 143 145  K 3.9 4.2 4.9  BUN 10 12 12   CREATININE 1.1 1.0 1.0   Recent Labs    08/27/17 09/04/17 10/31/17  AST 14 12* 15  ALT 15 12 15   ALKPHOS 140* 146* 142*   Recent Labs    09/04/17 09/10/17 10/31/17  WBC  7.2 5.8 7.8  NEUTROABS 4 3 6   HGB 10.6* 11.5* 11.4*  HCT 32* 36 34*  PLT 343 308 303   Lab  Results  Component Value Date   TSH 2.87 03/03/2016   Lab Results  Component Value Date   HGBA1C 5.7 06/04/2017   Lab Results  Component Value Date   CHOL 158 07/13/2017   HDL 31 (A) 07/13/2017   LDLCALC 94 07/13/2017   TRIG 163 (A) 07/13/2017   CHOLHDL 6.3 05/01/2012    Significant Diagnostic Results in last 30 days:  No results found.  Assessment/Plan   ICD-10-CM   1. Hyperglycemia R73.9   2. Morbid obesity with BMI of 40.0-44.9, adult ([place]) E66.01    Z68.41   3. MULTIPLE SCLEROSIS, PROGRESSIVE/RELAPSING G35   4. Chronic combined systolic and diastolic congestive heart failure (HCC) I50.42   5. Chronic pain syndrome G89.4   6. Paraplegia (HCC) G82.20   7. Allergic rhinitis due to pollen, unspecified seasonality J30.1   8. Encounter for hepatitis C virus screening test for high risk patient Z11.59    Z91.89     Discussed nutrition for weight loss - she is not motivated at this time  Cont current meds as ordered  PT/OT/ST as indicated  F/u with specialists as scheduled  [facility] NP to follow  Will follow  Labs/tests ordered: hepatitis C Ab, cmp, lipid panel, a1c   Eusebio Blazejewski S. Perlie Gold  [facility] and [facility] [address] [place], [place] [address] [phone] Cell (Monday-Friday 8 AM - 5 PM) [phone] After 5 PM and follow prompts

## 2017-12-10 LAB — HM HEPATITIS C SCREENING LAB: HM Hepatitis Screen: NEGATIVE

## 2017-12-10 LAB — LIPID PANEL
Cholesterol: 183 (ref 0–200)
HDL: 31 — AB (ref 35–70)
LDL Cholesterol: 98
Triglycerides: 271 — AB (ref 40–160)

## 2017-12-10 LAB — HEPATIC FUNCTION PANEL
ALT: 16 (ref 7–35)
AST: 17 (ref 13–35)
Alkaline Phosphatase: 149 — AB (ref 25–125)
BILIRUBIN, TOTAL: 0.2

## 2017-12-10 LAB — HEMOGLOBIN A1C: HEMOGLOBIN A1C: 6.2

## 2017-12-10 LAB — BASIC METABOLIC PANEL
BUN: 14 (ref 4–21)
CREATININE: 0.9 (ref 0.5–1.1)
GLUCOSE: 165
Potassium: 3.8 (ref 3.4–5.3)
SODIUM: 143 (ref 137–147)

## 2017-12-11 ENCOUNTER — Telehealth: Payer: Self-pay | Admitting: *Deleted

## 2017-12-11 NOTE — Telephone Encounter (Signed)
LMOM both home and cell #'s for 2nd emergency contact, Aura Fey, to have pt. call for an appt. with Dr. Felecia Shelling if she still would like to be seen here for her MS/fim

## 2017-12-11 NOTE — Telephone Encounter (Signed)
Attempted to contact pt. to schedule office visit.  # busy/fim

## 2017-12-11 NOTE — Telephone Encounter (Signed)
LMOM for emergency contact, sister-Debra Flight, to call or have pt. call for an appt/fim

## 2017-12-17 NOTE — Telephone Encounter (Signed)
Spoke with Shannon Garrett and explained pt. will need appt's both with Dr. Felecia Shelling and in the infusion suite.  I have sched. f/u with Dr. Felecia Shelling for 12/26/17, 9am, arrival time 20 min. early to check in./fim

## 2017-12-17 NOTE — Telephone Encounter (Signed)
Shannon Garrett with [facility] requesting a call back at [phone]. Stating she is unsure if the pt needs to schedule an appt or an infusion-which pts thinks only infusion

## 2017-12-26 ENCOUNTER — Encounter: Payer: Self-pay | Admitting: Neurology

## 2017-12-26 ENCOUNTER — Ambulatory Visit (INDEPENDENT_AMBULATORY_CARE_PROVIDER_SITE_OTHER): Payer: Medicare Other | Admitting: Neurology

## 2017-12-26 ENCOUNTER — Other Ambulatory Visit: Payer: Self-pay

## 2017-12-26 VITALS — BP 152/90 | HR 85 | Resp 20 | Ht 65.0 in | Wt 249.0 lb

## 2017-12-26 DIAGNOSIS — N319 Neuromuscular dysfunction of bladder, unspecified: Secondary | ICD-10-CM | POA: Diagnosis not present

## 2017-12-26 DIAGNOSIS — G4700 Insomnia, unspecified: Secondary | ICD-10-CM | POA: Diagnosis not present

## 2017-12-26 DIAGNOSIS — Z79899 Other long term (current) drug therapy: Secondary | ICD-10-CM

## 2017-12-26 DIAGNOSIS — G8220 Paraplegia, unspecified: Secondary | ICD-10-CM | POA: Diagnosis not present

## 2017-12-26 DIAGNOSIS — G35 Multiple sclerosis: Secondary | ICD-10-CM

## 2017-12-26 DIAGNOSIS — F32A Depression, unspecified: Secondary | ICD-10-CM

## 2017-12-26 DIAGNOSIS — F329 Major depressive disorder, single episode, unspecified: Secondary | ICD-10-CM

## 2017-12-26 MED ORDER — BACLOFEN 10 MG PO TABS: 10.0000 mg | ORAL_TABLET | Freq: Three times a day (TID) | ORAL | 11 refills | Status: DC

## 2017-12-26 NOTE — Progress Notes (Signed)
[facility]  PATIENT: Shannon Garrett DOB: 1961-03-08  REFERRING DOCTOR OR PCP:  Gildardo Cranker SOURCE: Patient, records from EMR, MRI images on PACS.  _________________________________   HISTORICAL  CHIEF COMPLAINT:  Chief Complaint  Patient presents with  . Multiple Sclerosis    Last Ocrevus infusion was 06/18/17. Here today to plan next infusion.  She is currently living at [facility] (phone# [phone]).  They did not send a medication list with her and she does not know what medications she is taking.  I have spoken with Lennette Bihari (transporter).  He sts. .he has the med list and will bring it back to the office/fim    HISTORY OF PRESENT ILLNESS:  Shannon Garrett is a 57 year old woman with multiple sclerosis.      Update 12/26/2017: She started [facility] October 2017.  She has tolerated it well and felt much she was stronger for a couple months.  Her last infusion was about 6 months ago.   She denies any exacerbations but feels she is a little weaker.  She has good use of the right arm but is weak in the left arm and both legs.   She is wheelchair-bound.  She notes spasticity in her legs.  Baclofen has helped some.  She notes speech is softer and swallowing is sometimes off.   She sees Speech therapy.      She has had UTIs and has urinary hesitancy.  She is on Flomax, pyridium and Myrbetriq.    She gets intermittent catheterization.    We discussed a suprapubic catheter.   She has gained weight, reporting about 15 pounds over the last year   From 09/26/2016:  MS:   She had the first split dose of Ocrelizumab in October.   She tolerated it well and there were no complications.   She feels she may be a little bit stronger.   Recent bloodwork in EMR.     She is feeling weaker in her left arm and legs are about the same.    She is on Tysabri and tolerates it well. However, she has missed some doses due to a hospitalization for UTI and NH not bringing her to last  scheduled infusion.     Gait/strength/sensation: She is unable to walk and uses a wheelchair much of the day.  She cannot transfer but can assist.   Her left leg is weaker than her right.   She also has milder weakness in her hands, left > right .   She has left > right, lefg > arm spasticity.    She denies  numbness but occasionally will get some tingling in the left arm and leg.   She has spasticity, helped by baclofen a little bit.      Bladder/bowel:   She notes bladder spasms that are only partially helped by Myrbetriq. She has some retention and has some benefit from tamsulosin.  and has had more UTI's lately.   She has urinary incontinence at times and wears diapers at her current nursing home.   She has bad constipation.     Miralax has nothelped  Vision: She denies any major problem with her vision.  Fatigue/sleep: She has physical and cognitive fatigue daily.   She denies excessive sleepiness..   Her sleep onset insomnia is worse than sleep maintenance.    Ambien helped her sleep a lot more than ramelteon.  Mood/cognition: She notes depression.  She gets irritable and gets frustrated easily.    This  is worse if she sleeps poorly.   She denies anxiety.  She sometimes has crying spells.   She is on Cymbalta and tolerates it well.   She denies any major problems with cognition but feels she was able to think and process information better in the past.  Pain:  She reports pain in all 4 limbs and in her back. The pain is worse in the left arm.Percocet helped her in the past.    MS history:     She was diagnosed with multiple sclerosis in the 1980s after presenting with optic neuritis, numbness in her legs and vertigo MRI was consistent with MS. Initially she was not placed on any medication as none were approved. She had multiple exacerbations and by the 1990s was having difficulty with her gait initially she was placed on Avonex and remain on that medication for many years.   She thinks I first  saw her about 8 years ago.   She thinks she was switched to Tysabri but continued to have some progression. Therefore, Novantrone was tried.   She feels that Novantrone did slow her progression down significantly. However, this needed to be stopped after she developed congestive heart failure.  She switched her care to Dr. Jacqulynn Cadet and went back on Tysabri therapy but stopped seeing him when he moved to the [place] area about 1-1/2 years ago.       REVIEW OF SYSTEMS: Constitutional: No fevers, chills, sweats, or change in appetite.  Notes fatigue and insomnia Eyes: No visual changes, double vision, eye pain Ear, nose and throat: No hearing loss, ear pain, nasal congestion, sore throat Cardiovascular: No chest pain, palpitations Respiratory: No shortness of breath at rest or with exertion.   No wheezes GastrointestinaI: No nausea, vomiting, diarrhea, abdominal pain, fecal incontinence Genitourinary: as above Musculoskeletal: Some neck pain, back pain Integumentary: No rash, pruritus, skin lesions Neurological: as above Psychiatric: as above Endocrine: No palpitations, diaphoresis, change in appetite, change in weigh or increased thirst Hematologic/Lymphatic: No anemia, purpura, petechiae. Allergic/Immunologic: No itchy/runny eyes, nasal congestion, recent allergic reactions, rashes  ALLERGIES: Allergies  Allergen Reactions  . Sulfonamide Derivatives Hives and Shortness Of Breath  . Penicillins Hives and Itching    Has patient had a PCN reaction causing immediate rash, facial/tongue/throat swelling, SOB or lightheadedness with hypotension: unknown Has patient had a PCN reaction causing severe rash involving mucus membranes or skin necrosis: unknown Has patient had a PCN reaction that required hospitalization: unknown Has patient had a PCN reaction occurring within the last 10 years: unknown If all of the above answers are "NO", then may proceed with Cephalosporin use. Prior course of  rocephin charted 05/2015    HOME MEDICATIONS:  Current Outpatient Medications:  .  acetaminophen (TYLENOL) 325 MG tablet, Take 650 mg by mouth 3 (three) times daily., Disp: , Rfl:  .  albuterol (PROVENTIL) (2.5 MG/3ML) 0.083% nebulizer solution, Take 3 mLs (2.5 mg total) by nebulization every 2 (two) hours as needed for wheezing., Disp: 75 mL, Rfl: 12 .  amantadine (SYMMETREL) 100 MG capsule, Take 100 mg by mouth daily. , Disp: , Rfl:  .  atorvastatin (LIPITOR) 10 MG tablet, Take 10 mg by mouth at bedtime., Disp: , Rfl:  .  Baclofen 5 MG TABS, Take 1 tablet by mouth 4 (four) times daily. Hold if lethargic or confused , Disp: , Rfl:  .  carvedilol (COREG) 25 MG tablet, Take 1 tablet (25 mg total) by mouth 2 (two) times daily with a  meal., Disp: , Rfl:  .  Cholecalciferol (VITAMIN D-3 PO), Take 1,000 Int'l Units/day by mouth at bedtime. , Disp: , Rfl:  .  CRANBERRY PO, Take 100 mg by mouth at bedtime. , Disp: , Rfl:  .  DULoxetine (CYMBALTA) 60 MG capsule, Take 60 mg by mouth daily., Disp: , Rfl:  .  famotidine (PEPCID) 20 MG tablet, Take 1 tablet (20 mg total) by mouth 2 (two) times daily., Disp: , Rfl:  .  fluticasone (FLONASE) 50 MCG/ACT nasal spray, Place 2 sprays into both nostrils every evening., Disp: , Rfl:  .  GABAPENTIN PO, Give 1000 mg by mouth two times daily., Disp: , Rfl:  .  hydrocortisone cream 0.5 %, Apply to cheeks topically two times a day for dry areas, avoid eyes, Disp: , Rfl:  .  ipratropium-albuterol (DUONEB) 0.5-2.5 (3) MG/3ML SOLN, Inhale 3 mLs into the lungs every 6 (six) hours., Disp: , Rfl:  .  Lactase (LACTAID PO), Take 1 tablet by mouth at bedtime. , Disp: , Rfl:  .  Lactulose 20 GM/30ML SOLN, Take 30 mLs by mouth daily as needed. , Disp: , Rfl:  .  latanoprost (XALATAN) 0.005 % ophthalmic solution, Place 1 drop into both eyes at bedtime., Disp: , Rfl:  .  linaclotide (LINZESS) 290 MCG CAPS capsule, Take 290 mcg by mouth daily before breakfast. , Disp: , Rfl:  .   loratadine (CLARITIN) 10 MG tablet, Take 10 mg by mouth daily., Disp: , Rfl:  .  magnesium hydroxide (MILK OF MAGNESIA) 400 MG/5ML suspension, Take 30 mLs by mouth every 8 (eight) hours as needed for mild constipation or moderate constipation., Disp: , Rfl:  .  Melatonin 5 MG TABS, Take 2 tablets by mouth at bedtime. , Disp: , Rfl:  .  Menthol-Methyl Salicylate (MUSCLE RUB) 10-15 % CREA, Apply to shoulders topically two times a day for pain, Disp: , Rfl:  .  mirabegron ER (MYRBETRIQ) 50 MG TB24 tablet, Take 50 mg by mouth at bedtime. , Disp: , Rfl:  .  ondansetron (ZOFRAN) 4 MG tablet, Take 4 mg by mouth every 6 (six) hours as needed for nausea or vomiting., Disp: , Rfl:  .  phenazopyridine (PYRIDIUM) 100 MG tablet, Take 100 mg by mouth every 8 (eight) hours as needed for pain., Disp: , Rfl:  .  polyethylene glycol (MIRALAX / GLYCOLAX) packet, Take 17 g by mouth daily., Disp: , Rfl:  .  potassium chloride (KLOR-CON) 20 MEQ packet, Take 20 mEq by mouth daily. , Disp: , Rfl:  .  Propylene Glycol (SYSTANE BALANCE) 0.6 % SOLN, Instill 1 drop in both eyes two times a day for dry eyes, Disp: , Rfl:  .  senna-docusate (SENOKOT S) 8.6-50 MG tablet, Take 2 tablets by mouth 2 (two) times daily. , Disp: , Rfl:  .  simethicone (MYLICON) 80 MG chewable tablet, Chew 80 mg by mouth every 6 (six) hours as needed for flatulence., Disp: , Rfl:  .  spironolactone (ALDACTONE) 25 MG tablet, Take 25 mg by mouth daily. For CHF, Disp: , Rfl:  .  tamsulosin (FLOMAX) 0.4 MG CAPS capsule, Take 0.4 mg by mouth daily. For incontinence, Disp: , Rfl:  .  torsemide (DEMADEX) 20 MG tablet, Take 20 mg by mouth daily., Disp: , Rfl:  .  zolpidem (AMBIEN) 5 MG tablet, Take 1 tablet (5 mg total) by mouth at bedtime., Disp: 30 tablet, Rfl: 0 .  baclofen (LIORESAL) 10 MG tablet, Take 1 tablet (10 mg total)  by mouth 3 (three) times daily., Disp: 90 each, Rfl: 11  PAST MEDICAL HISTORY: Past Medical History:  Diagnosis Date  .  Cardiomyopathy ([place])    a.  Echo 04/29/12: Mild LVH, EF 20-25%, mild AI, moderate MR, moderate LAE, mild RAE, mild RVE, moderate TR, PASP 51, small pericardial effusion;   b. probably non-ischemic given multiple chemo-Tx agents used for MS and global LV dysfn on echo  . Chronic systolic heart failure ([place])   . Depression   . Glaucoma   . Hypertension   . MS (multiple sclerosis) ([place])    a. Dx'd late 20's. b. Tx with Novantrone, Tysabri, Copaxone previously.    PAST SURGICAL HISTORY: Past Surgical History:  Procedure Laterality Date  . ABLATION     uterine  . CESAREAN SECTION      FAMILY HISTORY: Family History  Problem Relation Age of Onset  . Hypertension Mother   . Cancer Mother        breast   . Cancer Father        prostate  . Multiple sclerosis Sister   . Heart attack Neg Hx     SOCIAL HISTORY:  Social History   Socioeconomic History  . Marital status: Single    Spouse name: Not on file  . Number of children: Not on file  . Years of education: Not on file  . Highest education level: Not on file  Occupational History  . Not on file  Social Needs  . Financial resource strain: Not on file  . Food insecurity:    Worry: Not on file    Inability: Not on file  . Transportation needs:    Medical: Not on file    Non-medical: Not on file  Tobacco Use  . Smoking status: Former Smoker    Packs/day: 0.50    Years: 5.00    Pack years: 2.50  . Smokeless tobacco: Never Used  Substance and Sexual Activity  . Alcohol use: No  . Drug use: No  . Sexual activity: Not Currently  Lifestyle  . Physical activity:    Days per week: Not on file    Minutes per session: Not on file  . Stress: Not on file  Relationships  . Social connections:    Talks on phone: Not on file    Gets together: Not on file    Attends religious service: Not on file    Active member of club or organization: Not on file    Attends meetings of clubs or organizations: Not on file    Relationship  status: Not on file  . Intimate partner violence:    Fear of current or ex partner: Not on file    Emotionally abused: Not on file    Physically abused: Not on file    Forced sexual activity: Not on file  Other Topics Concern  . Not on file  Social History Narrative  . Not on file     PHYSICAL EXAM  Vitals:   12/26/17 0852  BP: (!) 152/90  Pulse: 85  Resp: 20  Weight: 249 lb (112.9 kg)  Height: 5\' 5"  (1.651 m)    Body mass index is 41.44 kg/m.   General: The patient is well-developed and well-nourished and in no acute distress  Ext: Extremities show mild ankle edema.   Neurologic Exam  Mental status: The patient is alert and oriented x 3 at the time of the examination. The patient has apparent normal recent and remote memory,  with an apparently normal attention span and concentration ability.   Speech is normal.  Cranial nerves: Extraocular movements are full.  Facial strength and sensation is normal.  Trapezius strength is normal..  The tongue is midline, and the patient has symmetric elevation of the soft palate. No obvious hearing deficits are noted.  Motor:  Muscle bulk is normal.   Tone is increase in the legs greater than the arms, left worse than right. Strength is 4+/5 in the right arm, 3/5 in the left arm, 2-/5 in the right leg and 1/5 in the left leg  Sensory: Sensory testing is intact to touch and vibration sensation in all 4 extremities.  Coordination: Cerebellar testing reveals goodright finger-nose-finger.   Left finger-nose finger is poor.  She cannot do heel-to-shin.  Gait and station: Wheelchair bound   DTRs:   Deep tendon reflexes are increased in all 4 limbs. She has clonus at the ankles. There is spread at the knees.  DIAGNOSTIC DATA (LABS, IMAGING, TESTING) - I reviewed patient records, labs, notes, testing and imaging myself where available.  Lab Results  Component Value Date   WBC 7.8 10/31/2017   HGB 11.4 (A) 10/31/2017   HCT 34 (A)  10/31/2017   MCV 82 01/25/2016   PLT 303 10/31/2017      Component Value Date/Time   NA 143 12/10/2017   K 3.8 12/10/2017   CL 103 01/25/2016 1641   CO2 23 01/25/2016 1641   GLUCOSE 113 (H) 01/25/2016 1641   GLUCOSE 112 (H) 11/09/2015 0441   BUN 14 12/10/2017   CREATININE 0.9 12/10/2017   CREATININE 1.20 (H) 01/25/2016 1641   CALCIUM 9.0 01/25/2016 1641   PROT 6.8 01/25/2016 1641   ALBUMIN 3.8 01/25/2016 1641   AST 17 12/10/2017   ALT 16 12/10/2017   ALKPHOS 149 (A) 12/10/2017   BILITOT <0.2 01/25/2016 1641   GFRNONAA 52 (L) 01/25/2016 1641   GFRAA 60 01/25/2016 1641   Lab Results  Component Value Date   CHOL 183 12/10/2017   HDL 31 (A) 12/10/2017   LDLCALC 98 12/10/2017   TRIG 271 (A) 12/10/2017   CHOLHDL 6.3 05/01/2012   Lab Results  Component Value Date   HGBA1C 6.2 12/10/2017   Lab Results  Component Value Date   VITAMINB12 2,449 (H) 07/10/2015   Lab Results  Component Value Date   TSH 2.87 03/03/2016       ASSESSMENT AND PLAN  MULTIPLE SCLEROSIS, PROGRESSIVE/RELAPSING - Plan: Ambulatory referral to Urology, CBC with Differential/Platelet, Comprehensive metabolic panel, IgG, IgA, IgM  Paraplegia (HCC)  Neurogenic bladder - Plan: Ambulatory referral to Urology  Insomnia, unspecified type  High risk medication use - Plan: CBC with Differential/Platelet, Comprehensive metabolic panel, IgG, IgA, IgM  Depression, unspecified depression type   1.   We had a long discussion about Ocrevus risk and benefits.  She is concerned that she really has not seemed to have much benefit from multiple different medications over the last 5 to 10 years.  She has secondary progressive MS and did have some activity 5 to 10 years ago.  Therefore, using a disease modifying therapy is reasonable.  However, we have to balance this use out with the risk.  I discussed with her that De Nurse is a strong medicine but that its benefit is probably less for people who have more  disability and some progression in the absence of relapses.   It was studied only in people who were ambulatory.Marland Kitchen  Specifically there is a  higher risk of pneumonia.  She understands these risks and would like to continue on medication.  The possible she would prefer to get the infusion done at the rehab center rather than the office and we will look into this possibility for her. 2.   Continue other medications.   3.   Increase baclofen to 10 mg 3 times daily 4.   Refer to urology for possibility of suprapubic catheter 5.   She asked about mesenchymal stem cells from fat cells.    apparently she has gone to a seminar s to helping the virtues of liposuction derived mesenchymal stem cells for the treatment of MS and other diseases.  She plans on going to another seminar.  I discussed with her that there is no evidence at all that that type of stem cell will do anything for her MS.  I do not recommend that she go through with this.   6.   She will return in about 5-6 months or sooner if she has new or worsening neurologic symptoms.  45-minute face-to-face evaluation with greater than one half the time counseling and coordinating care, with much of that time discussing the disease modifying therapies and her stage of MS.  Richard A. Felecia Shelling, MD, PhD 7/42/5956, 3:87 AM Certified in Neurology, Clinical Neurophysiology, Sleep Medicine, Pain Medicine and Neuroimaging  [facility] [address], [place], [place] [address] [phone]

## 2017-12-27 ENCOUNTER — Telehealth: Payer: Self-pay | Admitting: *Deleted

## 2017-12-27 ENCOUNTER — Encounter: Payer: Self-pay | Admitting: *Deleted

## 2017-12-27 LAB — CBC WITH DIFFERENTIAL/PLATELET
Basophils Absolute: 0.1 10*3/uL (ref 0.0–0.2)
Basos: 1 %
EOS (ABSOLUTE): 0.3 10*3/uL (ref 0.0–0.4)
Eos: 6 %
Hematocrit: 33.3 % — ABNORMAL LOW (ref 34.0–46.6)
Hemoglobin: 10.4 g/dL — ABNORMAL LOW (ref 11.1–15.9)
Immature Grans (Abs): 0 10*3/uL (ref 0.0–0.1)
Immature Granulocytes: 0 %
Lymphocytes Absolute: 0.9 10*3/uL (ref 0.7–3.1)
Lymphs: 18 %
MCH: 27.2 pg (ref 26.6–33.0)
MCHC: 31.2 g/dL — ABNORMAL LOW (ref 31.5–35.7)
MCV: 87 fL (ref 79–97)
MONOS ABS: 0.4 10*3/uL (ref 0.1–0.9)
Monocytes: 8 %
Neutrophils Absolute: 3.5 10*3/uL (ref 1.4–7.0)
Neutrophils: 67 %
PLATELETS: 303 10*3/uL (ref 150–450)
RBC: 3.83 x10E6/uL (ref 3.77–5.28)
RDW: 15.1 % (ref 12.3–15.4)
WBC: 5.3 10*3/uL (ref 3.4–10.8)

## 2017-12-27 LAB — COMPREHENSIVE METABOLIC PANEL
ALBUMIN: 4.1 g/dL (ref 3.5–5.5)
ALK PHOS: 155 IU/L — AB (ref 39–117)
ALT: 22 IU/L (ref 0–32)
AST: 21 IU/L (ref 0–40)
Albumin/Globulin Ratio: 1.4 (ref 1.2–2.2)
BUN / CREAT RATIO: 11 (ref 9–23)
BUN: 12 mg/dL (ref 6–24)
CHLORIDE: 102 mmol/L (ref 96–106)
CO2: 24 mmol/L (ref 20–29)
Calcium: 9.4 mg/dL (ref 8.7–10.2)
Creatinine, Ser: 1.09 mg/dL — ABNORMAL HIGH (ref 0.57–1.00)
GFR calc Af Amer: 66 mL/min/{1.73_m2} (ref >59)
GFR calc non Af Amer: 57 mL/min/{1.73_m2} — ABNORMAL LOW (ref >59)
GLOBULIN, TOTAL: 2.9 g/dL (ref 1.5–4.5)
GLUCOSE: 178 mg/dL — AB (ref 65–99)
Potassium: 3.9 mmol/L (ref 3.5–5.2)
SODIUM: 145 mmol/L — AB (ref 134–144)
Total Protein: 7 g/dL (ref 6.0–8.5)

## 2017-12-27 LAB — IGG, IGA, IGM
IGA/IMMUNOGLOBULIN A, SERUM: 365 mg/dL — AB (ref 87–352)
IGG (IMMUNOGLOBIN G), SERUM: 958 mg/dL (ref 700–1600)
IgM (Immunoglobulin M), Srm: 37 mg/dL (ref 26–217)

## 2017-12-27 NOTE — Telephone Encounter (Signed)
-----   Message from Britt Bottom, MD sent at 12/27/2017 10:22 AM EDT ----- Please let the patient know that the lab work is fine.

## 2017-12-27 NOTE — Telephone Encounter (Signed)
We are unable to reach patient at numbers listed in chart.  One number had a message that "you call cannot be completed at this time" and the other number repeatedly rang busy.  Letter mailed to notify patient of her results.

## 2018-01-12 ENCOUNTER — Encounter: Payer: Self-pay | Admitting: Internal Medicine

## 2018-01-28 ENCOUNTER — Telehealth: Payer: Self-pay | Admitting: Neurology

## 2018-01-28 NOTE — Telephone Encounter (Signed)
Pt  Has spoken with [facility] and needs script for ocrevus. Please call to let her know the status.

## 2018-01-28 NOTE — Telephone Encounter (Signed)
I have printed this request and left for [facility] to check status of patient's prescription.

## 2018-02-13 ENCOUNTER — Non-Acute Institutional Stay (SKILLED_NURSING_FACILITY): Payer: Medicare Other

## 2018-02-13 DIAGNOSIS — Z0000 Encounter for general adult medical examination without abnormal findings: Secondary | ICD-10-CM

## 2018-02-13 NOTE — Progress Notes (Signed)
Subjective:   Shannon Garrett is a 57 y.o. female who presents for Medicare Annual (Subsequent) preventive examination at [facility]  Last AWV-01/17/2017    Objective:     Vitals: BP 135/82 (BP Location: Left Arm, Patient Position: Sitting)   Pulse 95   Temp 98.2 F (36.8 C) (Oral)   Ht 5\' 5"  (1.651 m)   Wt 249 lb (112.9 kg)   SpO2 95%   BMI 41.44 kg/m   Body mass index is 41.44 kg/m.  Advanced Directives 02/13/2018 12/09/2017 10/21/2017 08/29/2017 07/01/2017 06/26/2017 06/03/2017  Does Patient Have a Medical Advance Directive? Yes Yes Yes Yes Yes Yes Yes  Type of Advance Directive Out of facility DNR (pink MOST or yellow form) Out of facility DNR (pink MOST or yellow form) Out of facility DNR (pink MOST or yellow form) Out of facility DNR (pink MOST or yellow form) Out of facility DNR (pink MOST or yellow form) Out of facility DNR (pink MOST or yellow form) Out of facility DNR (pink MOST or yellow form)  Does patient want to make changes to medical advance directive? No - Patient declined No - Patient declined No - Patient declined No - Patient declined No - Patient declined No - Patient declined No - Patient declined  Copy of [place] in [place]  Would patient like information on creating a medical advance directive? - - - - - - -  Pre-existing out of facility DNR order (yellow form or pink MOST form) Yellow form placed in chart (order not valid for inpatient use) Pink MOST form placed in chart (order not valid for inpatient use) Pink MOST form placed in chart (order not valid for inpatient use) Pink MOST form placed in chart (order not valid for inpatient use) Yellow form placed in chart (order not valid for inpatient use);Pink MOST form placed in chart (order not valid for inpatient use) Yellow form placed in chart (order not valid for inpatient use);Pink MOST form placed in chart (order not valid for inpatient use) Yellow form placed in  chart (order not valid for inpatient use);Pink MOST form placed in chart (order not valid for inpatient use)    Tobacco Social History   Tobacco Use  Smoking Status Former Smoker  . Packs/day: 0.50  . Years: 5.00  . Pack years: 2.50  Smokeless Tobacco Never Used     Counseling given: Not Answered   Clinical Intake:  Pre-visit preparation completed: No  Pain : No/denies pain     Nutritional Risks: None Diabetes: No  How often do you need to have someone help you when you read instructions, pamphlets, or other written materials from your doctor or pharmacy?: 1 - Never  Interpreter Needed?: No  Information entered by :: Tyson Dense, RN  Past Medical History:  Diagnosis Date  . Cardiomyopathy ([place])    a.  Echo 04/29/12: Mild LVH, EF 20-25%, mild AI, moderate MR, moderate LAE, mild RAE, mild RVE, moderate TR, PASP 51, small pericardial effusion;   b. probably non-ischemic given multiple chemo-Tx agents used for MS and global LV dysfn on echo  . Chronic systolic heart failure ([place])   . Depression   . Glaucoma   . Hypertension   . MS (multiple sclerosis) ([place])    a. Dx'd late 20's. b. Tx with Novantrone, Tysabri, Copaxone previously.   Past Surgical History:  Procedure Laterality Date  . ABLATION     uterine  .  CESAREAN SECTION     Family History  Problem Relation Age of Onset  . Hypertension Mother   . Cancer Mother        breast   . Cancer Father        prostate  . Multiple sclerosis Sister   . Heart attack Neg Hx    Social History   Socioeconomic History  . Marital status: Single    Spouse name: Not on file  . Number of children: Not on file  . Years of education: Not on file  . Highest education level: Not on file  Occupational History  . Not on file  Social Needs  . Financial resource strain: Not hard at all  . Food insecurity:    Worry: Never true    Inability: Never true  . Transportation needs:    Medical: No    Non-medical: No  Tobacco  Use  . Smoking status: Former Smoker    Packs/day: 0.50    Years: 5.00    Pack years: 2.50  . Smokeless tobacco: Never Used  Substance and Sexual Activity  . Alcohol use: No  . Drug use: No  . Sexual activity: Not Currently  Lifestyle  . Physical activity:    Days per week: 0 days    Minutes per session: 0 min  . Stress: Only a little  Relationships  . Social connections:    Talks on phone: More than three times a week    Gets together: Twice a week    Attends religious service: Never    Active member of club or organization: No    Attends meetings of clubs or organizations: Never    Relationship status: Never married  Other Topics Concern  . Not on file  Social History Narrative  . Not on file    Outpatient Encounter Medications as of 02/13/2018  Medication Sig  . acetaminophen (TYLENOL) 325 MG tablet Take 650 mg by mouth 3 (three) times daily.  Marland Kitchen albuterol (PROVENTIL) (2.5 MG/3ML) 0.083% nebulizer solution Take 3 mLs (2.5 mg total) by nebulization every 2 (two) hours as needed for wheezing.  Marland Kitchen amantadine (SYMMETREL) 100 MG capsule Take 100 mg by mouth daily.   Marland Kitchen atorvastatin (LIPITOR) 10 MG tablet Take 10 mg by mouth at bedtime.  . baclofen (LIORESAL) 10 MG tablet Take 1 tablet (10 mg total) by mouth 3 (three) times daily.  . Baclofen 5 MG TABS Take 1 tablet by mouth 4 (four) times daily. Hold if lethargic or confused   . carvedilol (COREG) 25 MG tablet Take 1 tablet (25 mg total) by mouth 2 (two) times daily with a meal.  . Cholecalciferol (VITAMIN D-3 PO) Take 1,000 Int'l Units/day by mouth at bedtime.   Marland Kitchen CRANBERRY PO Take 100 mg by mouth at bedtime.   . DULoxetine (CYMBALTA) 60 MG capsule Take 60 mg by mouth daily.  . famotidine (PEPCID) 20 MG tablet Take 1 tablet (20 mg total) by mouth 2 (two) times daily.  . fluticasone (FLONASE) 50 MCG/ACT nasal spray Place 2 sprays into both nostrils every evening.  Marland Kitchen GABAPENTIN PO Give 1000 mg by mouth two times daily.  .  hydrocortisone cream 0.5 % Apply to cheeks topically two times a day for dry areas, avoid eyes  . ipratropium-albuterol (DUONEB) 0.5-2.5 (3) MG/3ML SOLN Inhale 3 mLs into the lungs every 6 (six) hours.  . Lactase (LACTAID PO) Take 1 tablet by mouth at bedtime.   . Lactulose 20 GM/30ML SOLN Take 30  mLs by mouth daily as needed.   . latanoprost (XALATAN) 0.005 % ophthalmic solution Place 1 drop into both eyes at bedtime.  Marland Kitchen linaclotide (LINZESS) 290 MCG CAPS capsule Take 290 mcg by mouth daily before breakfast.   . loratadine (CLARITIN) 10 MG tablet Take 10 mg by mouth daily.  . magnesium hydroxide (MILK OF MAGNESIA) 400 MG/5ML suspension Take 30 mLs by mouth every 8 (eight) hours as needed for mild constipation or moderate constipation.  . Melatonin 5 MG TABS Take 2 tablets by mouth at bedtime.   . Menthol-Methyl Salicylate (MUSCLE RUB) 10-15 % CREA Apply to shoulders topically two times a day for pain  . mirabegron ER (MYRBETRIQ) 50 MG TB24 tablet Take 50 mg by mouth at bedtime.   . ondansetron (ZOFRAN) 4 MG tablet Take 4 mg by mouth every 6 (six) hours as needed for nausea or vomiting.  . phenazopyridine (PYRIDIUM) 100 MG tablet Take 100 mg by mouth every 8 (eight) hours as needed for pain.  . polyethylene glycol (MIRALAX / GLYCOLAX) packet Take 17 g by mouth daily.  . potassium chloride (KLOR-CON) 20 MEQ packet Take 20 mEq by mouth daily.   Marland Kitchen Propylene Glycol (SYSTANE BALANCE) 0.6 % SOLN Instill 1 drop in both eyes two times a day for dry eyes  . senna-docusate (SENOKOT S) 8.6-50 MG tablet Take 2 tablets by mouth 2 (two) times daily.   . simethicone (MYLICON) 80 MG chewable tablet Chew 80 mg by mouth every 6 (six) hours as needed for flatulence.  Marland Kitchen spironolactone (ALDACTONE) 25 MG tablet Take 25 mg by mouth daily. For CHF  . tamsulosin (FLOMAX) 0.4 MG CAPS capsule Take 0.4 mg by mouth daily. For incontinence  . torsemide (DEMADEX) 20 MG tablet Take 20 mg by mouth daily.  Marland Kitchen zolpidem (AMBIEN) 5  MG tablet Take 1 tablet (5 mg total) by mouth at bedtime.   No facility-administered encounter medications on file as of 02/13/2018.     Activities of Daily Living In your present state of health, do you have any difficulty performing the following activities: 02/13/2018  Hearing? N  Vision? N  Difficulty concentrating or making decisions? N  Walking or climbing stairs? Y  Dressing or bathing? Y  Doing errands, shopping? Y  Preparing Food and eating ? Y  Using the Toilet? Y  In the past six months, have you accidently leaked urine? Y  Do you have problems with loss of bowel control? Y  Managing your Medications? Y  Managing your Finances? Y  Housekeeping or managing your Housekeeping? Y  Some recent data might be hidden    Patient Care Team: Gildardo Cranker, DO as PCP - [facility], [facility] ([place])    Assessment:   This is a routine wellness examination for Shannon Garrett.  Exercise Activities and Dietary recommendations Current Exercise Habits: The patient does not participate in regular exercise at present, Exercise limited by: orthopedic condition(s)  Goals   None     Fall Risk Fall Risk  02/13/2018 01/17/2017  Falls in the past year? No Yes  Number falls in past yr: - 1  Injury with Fall? - No   Is the patient's home free of loose throw rugs in walkways, pet beds, electrical cords, etc?   yes      Grab bars in the bathroom? yes      Handrails on the stairs?   yes      Adequate lighting?   yes  Depression  Screen PHQ 2/9 Scores 02/13/2018 01/17/2017  PHQ - 2 Score 1 6  PHQ- 9 Score - 16     Cognitive Function   Montreal Cognitive Assessment  09/27/2014  Visuospatial/ Executive (0/5) 2  Naming (0/3) 3  Attention: Read list of digits (0/2) 2  Attention: Read list of letters (0/1) 1  Attention: Serial 7 subtraction starting at 100 (0/3) 1  Language: Repeat phrase (0/2) 1  Language : Fluency (0/1) 0  Abstraction (0/2) 2   Delayed Recall (0/5) 0  Orientation (0/6) 6  Total 18  Adjusted Score (based on education) 18   6CIT Screen 02/13/2018 01/17/2017  What Year? 0 points 0 points  What month? 0 points 0 points  What time? 0 points 0 points  Count back from 20 0 points 0 points  Months in reverse 0 points 0 points  Repeat phrase 4 points 2 points  Total Score 4 2    Immunization History  Administered Date(s) Administered  . Influenza,inj,Quad PF,6+ Mos 08/01/2013  . Influenza-Unspecified 08/31/2014, 08/09/2015, 05/19/2016, 05/01/2017  . PPD Test 03/23/2016, 03/30/2016  . Pneumococcal Polysaccharide-23 08/01/2013  . Pneumococcal-Unspecified 08/30/2013, 07/29/2014, 08/31/2014    Qualifies for Shingles Vaccine? Not in past reocords  Screening Tests Health Maintenance  Topic Date Due  . INFLUENZA VACCINE  01/30/2018  . MAMMOGRAM  09/21/2018  . Hepatitis C Screening  Completed  . HIV Screening  Completed  . PAP SMEAR  Discontinued  . COLONOSCOPY  Discontinued  . TETANUS/TDAP  Discontinued    Cancer Screenings: Lung: Low Dose CT Chest recommended if Age 39-80 years, 30 pack-year currently smoking OR have quit w/in 15years. Patient does not qualify. Breast:  Up to date on Mammogram? Yes   Up to date of Bone Density/Dexa? Yes, due at age 55 Colorectal: excluded  Additional Screenings:  Hepatitis C Screening: declined     Plan:  I have personally reviewed and addressed the Medicare Annual Wellness questionnaire and have noted the following in the patient's chart:  A. Medical and social history B. Use of alcohol, tobacco or illicit drugs  C. Current medications and supplements D. Functional ability and status E.  Nutritional status F.  Physical activity G. Advance directives H. List of other physicians I.  Hospitalizations, surgeries, and ER visits in previous 12 months J.  [place] to include hearing, vision, cognitive, depression L. Referrals and appointments - none  In  addition, I have reviewed and discussed with patient certain preventive protocols, quality metrics, and best practice recommendations. A written personalized care plan for preventive services as well as general preventive health recommendations were provided to patient.  See attached scanned questionnaire for additional information.   Signed,   Tyson Dense, RN Nurse Health Advisor  Patient Concerns: Broke a tooth-dental consult ordered

## 2018-02-13 NOTE — Patient Instructions (Signed)
Shannon Garrett , Thank you for taking time to come for your Medicare Wellness Visit. I appreciate your ongoing commitment to your health goals. Please review the following plan we discussed and let me know if I can assist you in the future.   Screening recommendations/referrals: Colonoscopy excluded Mammogram up to date Bone Density due at age 55 Recommended yearly ophthalmology/optometry visit for glaucoma screening and checkup Recommended yearly dental visit for hygiene and checkup  Vaccinations: Influenza vaccine due 2019 fall season Pneumococcal vaccine due at age 59 Tdap vaccine excluded Shingles vaccine not in past records    Advanced directives: in chart  Conditions/risks identified: none  Next appointment: Dr. Eulas Post makes rounds  Preventive Care 40-64 Years, Female Preventive care refers to lifestyle choices and visits with your health care provider that can promote health and wellness. What does preventive care include?  A yearly physical exam. This is also called an annual well check.  Dental exams once or twice a year.  Routine eye exams. Ask your health care provider how often you should have your eyes checked.  Personal lifestyle choices, including:  Daily care of your teeth and gums.  Regular physical activity.  Eating a healthy diet.  Avoiding tobacco and drug use.  Limiting alcohol use.  Practicing safe sex.  Taking low-dose aspirin daily starting at age 52.  Taking vitamin and mineral supplements as recommended by your health care provider. What happens during an annual well check? The services and screenings done by your health care provider during your annual well check will depend on your age, overall health, lifestyle risk factors, and family history of disease. Counseling  Your health care provider may ask you questions about your:  Alcohol use.  Tobacco use.  Drug use.  Emotional well-being.  Home and relationship  well-being.  Sexual activity.  Eating habits.  Work and work Statistician.  Method of birth control.  Menstrual cycle.  Pregnancy history. Screening  You may have the following tests or measurements:  Height, weight, and BMI.  Blood pressure.  Lipid and cholesterol levels. These may be checked every 5 years, or more frequently if you are over 53 years old.  Skin check.  Lung cancer screening. You may have this screening every year starting at age 69 if you have a 30-pack-year history of smoking and currently smoke or have quit within the past 15 years.  Fecal occult blood test (FOBT) of the stool. You may have this test every year starting at age 60.  Flexible sigmoidoscopy or colonoscopy. You may have a sigmoidoscopy every 5 years or a colonoscopy every 10 years starting at age 63.  Hepatitis C blood test.  Hepatitis B blood test.  Sexually transmitted disease (STD) testing.  Diabetes screening. This is done by checking your blood sugar (glucose) after you have not eaten for a while (fasting). You may have this done every 1-3 years.  Mammogram. This may be done every 1-2 years. Talk to your health care provider about when you should start having regular mammograms. This may depend on whether you have a family history of breast cancer.  BRCA-related cancer screening. This may be done if you have a family history of breast, ovarian, tubal, or peritoneal cancers.  Pelvic exam and Pap test. This may be done every 3 years starting at age 17. Starting at age 45, this may be done every 5 years if you have a Pap test in combination with an HPV test.  Bone density scan. This is  done to screen for osteoporosis. You may have this scan if you are at high risk for osteoporosis. Discuss your test results, treatment options, and if necessary, the need for more tests with your health care provider. Vaccines  Your health care provider may recommend certain vaccines, such  as:  Influenza vaccine. This is recommended every year.  Tetanus, diphtheria, and acellular pertussis (Tdap, Td) vaccine. You may need a Td booster every 10 years.  Zoster vaccine. You may need this after age 8.  Pneumococcal 13-valent conjugate (PCV13) vaccine. You may need this if you have certain conditions and were not previously vaccinated.  Pneumococcal polysaccharide (PPSV23) vaccine. You may need one or two doses if you smoke cigarettes or if you have certain conditions. Talk to your health care provider about which screenings and vaccines you need and how often you need them. This information is not intended to replace advice given to you by your health care provider. Make sure you discuss any questions you have with your health care provider. Document Released: 07/15/2015 Document Revised: 03/07/2016 Document Reviewed: 04/19/2015 Elsevier Interactive Patient Education  2017 [place] Prevention in the Home Falls can cause injuries. They can happen to people of all ages. There are many things you can do to make your home safe and to help prevent falls. What can I do on the outside of my home?  Regularly fix the edges of walkways and driveways and fix any cracks.  Remove anything that might make you trip as you walk through a door, such as a raised step or threshold.  Trim any bushes or trees on the path to your home.  Use bright outdoor lighting.  Clear any walking paths of anything that might make someone trip, such as rocks or tools.  Regularly check to see if handrails are loose or broken. Make sure that both sides of any steps have handrails.  Any raised decks and porches should have guardrails on the edges.  Have any leaves, snow, or ice cleared regularly.  Use sand or salt on walking paths during winter.  Clean up any spills in your garage right away. This includes oil or grease spills. What can I do in the bathroom?  Use night  lights.  Install grab bars by the toilet and in the tub and shower. Do not use towel bars as grab bars.  Use non-skid mats or decals in the tub or shower.  If you need to sit down in the shower, use a plastic, non-slip stool.  Keep the floor dry. Clean up any water that spills on the floor as soon as it happens.  Remove soap buildup in the tub or shower regularly.  Attach bath mats securely with double-sided non-slip rug tape.  Do not have throw rugs and other things on the floor that can make you trip. What can I do in the bedroom?  Use night lights.  Make sure that you have a light by your bed that is easy to reach.  Do not use any sheets or blankets that are too big for your bed. They should not hang down onto the floor.  Have a firm chair that has side arms. You can use this for support while you get dressed.  Do not have throw rugs and other things on the floor that can make you trip. What can I do in the kitchen?  Clean up any spills right away.  Avoid walking on wet floors.  Keep items  that you use a lot in easy-to-reach places.  If you need to reach something above you, use a strong step stool that has a grab bar.  Keep electrical cords out of the way.  Do not use floor polish or wax that makes floors slippery. If you must use wax, use non-skid floor wax.  Do not have throw rugs and other things on the floor that can make you trip. What can I do with my stairs?  Do not leave any items on the stairs.  Make sure that there are handrails on both sides of the stairs and use them. Fix handrails that are broken or loose. Make sure that handrails are as long as the stairways.  Check any carpeting to make sure that it is firmly attached to the stairs. Fix any carpet that is loose or worn.  Avoid having throw rugs at the top or bottom of the stairs. If you do have throw rugs, attach them to the floor with carpet tape.  Make sure that you have a light switch at the  top of the stairs and the bottom of the stairs. If you do not have them, ask someone to add them for you. What else can I do to help prevent falls?  Wear shoes that:  Do not have high heels.  Have rubber bottoms.  Are comfortable and fit you well.  Are closed at the toe. Do not wear sandals.  If you use a stepladder:  Make sure that it is fully opened. Do not climb a closed stepladder.  Make sure that both sides of the stepladder are locked into place.  Ask someone to hold it for you, if possible.  Clearly mark and make sure that you can see:  Any grab bars or handrails.  First and last steps.  Where the edge of each step is.  Use tools that help you move around (mobility aids) if they are needed. These include:  Canes.  Walkers.  Scooters.  Crutches.  Turn on the lights when you go into a dark area. Replace any light bulbs as soon as they burn out.  Set up your furniture so you have a clear path. Avoid moving your furniture around.  If any of your floors are uneven, fix them.  If there are any pets around you, be aware of where they are.  Review your medicines with your doctor. Some medicines can make you feel dizzy. This can increase your chance of falling. Ask your doctor what other things that you can do to help prevent falls. This information is not intended to replace advice given to you by your health care provider. Make sure you discuss any questions you have with your health care provider. Document Released: 04/14/2009 Document Revised: 11/24/2015 Document Reviewed: 07/23/2014 Elsevier Interactive Patient Education  2017 [employer].

## 2018-02-17 ENCOUNTER — Other Ambulatory Visit: Payer: Self-pay | Admitting: Neurology

## 2018-02-17 ENCOUNTER — Telehealth: Payer: Self-pay | Admitting: Neurology

## 2018-02-17 DIAGNOSIS — Z79899 Other long term (current) drug therapy: Secondary | ICD-10-CM

## 2018-02-17 DIAGNOSIS — G35 Multiple sclerosis: Secondary | ICD-10-CM

## 2018-02-17 NOTE — Addendum Note (Signed)
Addended by: Inis Sizer D on: 02/17/2018 04:20 PM   Modules accepted: Orders

## 2018-02-17 NOTE — Telephone Encounter (Signed)
Shannon Garrett is here today for her ocrelizumab infusion.  She did have a few rales and mild wheezes on lung exam.   Blood work was checked 12/26/2017, IgG/IgM was normal, lymphocyte count was normal, LFTs were normal, hemoglobin was slightly reduced and creatinine was minimally elevated  She tolerated the infusion well.  We will check a CBC with differential and CMP.  If she gets any fevers or shortness of breath she will need to get a chest x-ray.

## 2018-02-18 LAB — COMPREHENSIVE METABOLIC PANEL
A/G RATIO: 1.4 (ref 1.2–2.2)
ALT: 21 IU/L (ref 0–32)
AST: 22 IU/L (ref 0–40)
Albumin: 4.3 g/dL (ref 3.5–5.5)
Alkaline Phosphatase: 187 IU/L — ABNORMAL HIGH (ref 39–117)
BUN / CREAT RATIO: 14 (ref 9–23)
BUN: 19 mg/dL (ref 6–24)
CHLORIDE: 104 mmol/L (ref 96–106)
CO2: 21 mmol/L (ref 20–29)
Calcium: 9.4 mg/dL (ref 8.7–10.2)
Creatinine, Ser: 1.34 mg/dL — ABNORMAL HIGH (ref 0.57–1.00)
GFR calc non Af Amer: 45 mL/min/{1.73_m2} — ABNORMAL LOW (ref >59)
GFR, EST AFRICAN AMERICAN: 51 mL/min/{1.73_m2} — AB (ref >59)
GLOBULIN, TOTAL: 3 g/dL (ref 1.5–4.5)
Glucose: 220 mg/dL — ABNORMAL HIGH (ref 65–99)
POTASSIUM: 4.8 mmol/L (ref 3.5–5.2)
SODIUM: 142 mmol/L (ref 134–144)
TOTAL PROTEIN: 7.3 g/dL (ref 6.0–8.5)

## 2018-02-18 LAB — BASIC METABOLIC PANEL
BUN: 19 (ref 4–21)
CREATININE: 1.1 (ref 0.5–1.1)
Glucose: 199
POTASSIUM: 4.4 (ref 3.4–5.3)
Sodium: 140 (ref 137–147)

## 2018-02-18 LAB — CBC WITH DIFFERENTIAL/PLATELET
BASOS ABS: 0 10*3/uL (ref 0.0–0.2)
BASOS: 0 %
EOS (ABSOLUTE): 0 10*3/uL (ref 0.0–0.4)
Eos: 0 %
Hematocrit: 33.8 % — ABNORMAL LOW (ref 34.0–46.6)
Hemoglobin: 11 g/dL — ABNORMAL LOW (ref 11.1–15.9)
Immature Grans (Abs): 0.1 10*3/uL (ref 0.0–0.1)
Immature Granulocytes: 1 %
LYMPHS ABS: 0.6 10*3/uL — AB (ref 0.7–3.1)
Lymphs: 6 %
MCH: 27.4 pg (ref 26.6–33.0)
MCHC: 32.5 g/dL (ref 31.5–35.7)
MCV: 84 fL (ref 79–97)
MONOS ABS: 0.3 10*3/uL (ref 0.1–0.9)
Monocytes: 3 %
NEUTROS ABS: 9.1 10*3/uL — AB (ref 1.4–7.0)
Neutrophils: 90 %
PLATELETS: 308 10*3/uL (ref 150–450)
RBC: 4.01 x10E6/uL (ref 3.77–5.28)
RDW: 17.3 % — AB (ref 12.3–15.4)
WBC: 10.1 10*3/uL (ref 3.4–10.8)

## 2018-02-18 LAB — CBC AND DIFFERENTIAL
HEMATOCRIT: 31 — AB (ref 36–46)
Hemoglobin: 10.2 — AB (ref 12.0–16.0)
Neutrophils Absolute: 9
Platelets: 301 (ref 150–399)
WBC: 10

## 2018-02-19 ENCOUNTER — Encounter: Payer: Self-pay | Admitting: Internal Medicine

## 2018-02-24 ENCOUNTER — Encounter: Payer: Self-pay | Admitting: Internal Medicine

## 2018-02-24 ENCOUNTER — Non-Acute Institutional Stay (SKILLED_NURSING_FACILITY): Payer: Medicare Other | Admitting: Internal Medicine

## 2018-02-24 DIAGNOSIS — I5042 Chronic combined systolic (congestive) and diastolic (congestive) heart failure: Secondary | ICD-10-CM | POA: Diagnosis not present

## 2018-02-24 DIAGNOSIS — G35 Multiple sclerosis: Secondary | ICD-10-CM

## 2018-02-24 DIAGNOSIS — G8220 Paraplegia, unspecified: Secondary | ICD-10-CM

## 2018-02-24 DIAGNOSIS — R635 Abnormal weight gain: Secondary | ICD-10-CM

## 2018-02-24 DIAGNOSIS — Z6841 Body Mass Index (BMI) 40.0 and over, adult: Secondary | ICD-10-CM

## 2018-02-24 DIAGNOSIS — G35D Multiple sclerosis, unspecified: Secondary | ICD-10-CM

## 2018-02-24 NOTE — Progress Notes (Signed)
Patient ID: Shannon Garrett, female   DOB: 08/30/1960, 57 y.o.   MRN: [id number]   Location:  [facility] Room Number: 230 B Place of Service:  SNF [phone]) Provider:  [address] [address], [place], DO  Patient Care Team: Gildardo Cranker, DO as PCP - General (Internal Medicine) Center, [place] ([place])  Extended Emergency Contact Information Primary Emergency Contact: [place], [place] [address] [place] Phone: [phone] Relation: Sister Secondary Emergency Contact: Jefm Bryant, [place] Phone: [phone] Mobile Phone: [phone] Relation: Friend  Code Status:  DNR Goals of care: Advanced Directive information Advanced Directives 02/24/2018  Does Patient Have a Medical Advance Directive? Yes  Type of Advance Directive Out of facility DNR (pink MOST or yellow form)  Does patient want to make changes to medical advance directive? No - Patient declined  Copy of [place] in Chart? -  Would patient like information on creating a medical advance directive? -  Pre-existing out of facility DNR order (yellow form or pink MOST form) Yellow form placed in chart (order not valid for inpatient use)     Chief Complaint  Patient presents with  . Medical Management of Chronic Issues    [facility]    HPI:  Pt is a 57 y.o. female seen today for medical management of chronic diseases.  She c/o not feeling well today but feels better than last week. Chest congestion improved with extra dose of lasix last week. No f/c. No SOB. No nursing issues. No falls. Appetite ok and sleeps well. Labs from SNF reviewed - Cr  (02/17/18 by Dr Felecia Shelling) 1.34 >>1.11. Hgb 10.2  Glaucoma - stable on xalatan gtts to both eyes.    MS with paraplegia - slowly declining;  Last seen by neurology Dr Felecia Shelling 09/26/16; takes amantadine 100 mg dail;y baclofen 5 mg three times for  muscle spasms;    Chronic pain syndrome - stable on neurontin 1000 mg twice daily; cymbalta 60 mg daily; tylenol 650 mg three times daily; topical biofreeze to left shoulder four times daily   Chronic combined systolic and diastolic heart failure - EF 55-60% (Mar 2019); Stable on demadex 20  mg daily with k+ 20 meq daily; aldactone 25 mg daily   Constipation - stable on senna s twice daily; linzess 145 mcg daily   Dyslipidemia - stable on lipitor 10 mg daily  Allergic rhinitis - stable on claritin 10 mg daily and flonase daily   Depression/insomnia - mood stable on cymbalta 60 mg daily; ambien 5  mg nightly for sleep   HTN - stable on coreg 25 mg twice daily  Neurogenic bladder - stable on flomax 0.4 mg daily;  myrbetriq 50 mg daily  Does not require I/o cath at this time.   GERD - stable on pepcid 20 mg twice daily    Past Medical History:  Diagnosis Date  . Cardiomyopathy ([place])    a.  Echo 04/29/12: Mild LVH, EF 20-25%, mild AI, moderate MR, moderate LAE, mild RAE, mild RVE, moderate TR, PASP 51, small pericardial effusion;   b. probably non-ischemic given multiple chemo-Tx agents used for MS and global LV dysfn on echo  . Chronic systolic heart failure ([place])   . Depression   . Glaucoma   . Hypertension   . MS (multiple sclerosis) ([place])  a. Dx'd late 20's. b. Tx with Novantrone, Tysabri, Copaxone previously.   Past Surgical History:  Procedure Laterality Date  . ABLATION     uterine  . CESAREAN SECTION      Allergies  Allergen Reactions  . Sulfonamide Derivatives Hives and Shortness Of Breath  . Penicillins Hives and Itching    Has patient had a PCN reaction causing immediate rash, facial/tongue/throat swelling, SOB or lightheadedness with hypotension: unknown Has patient had a PCN reaction causing severe rash involving mucus membranes or skin necrosis: unknown Has patient had a PCN reaction that required hospitalization: unknown Has patient had a PCN reaction  occurring within the last 10 years: unknown If all of the above answers are "NO", then may proceed with Cephalosporin use. Prior course of rocephin charted 05/2015    Outpatient Encounter Medications as of 02/24/2018  Medication Sig  . acetaminophen (TYLENOL) 325 MG tablet Take 650 mg by mouth 3 (three) times daily.  Marland Kitchen albuterol (PROVENTIL) (2.5 MG/3ML) 0.083% nebulizer solution Take 3 mLs (2.5 mg total) by nebulization every 2 (two) hours as needed for wheezing.  Marland Kitchen amantadine (SYMMETREL) 100 MG capsule Take 100 mg by mouth daily.   Marland Kitchen atorvastatin (LIPITOR) 10 MG tablet Take 10 mg by mouth at bedtime.  . Baclofen 5 MG TABS Take 1 tablet by mouth 4 (four) times daily. Hold if lethargic or confused   . benzocaine (HURRICAINE) 20 % GEL Use as directed 1 application in the mouth or throat 2 (two) times daily.  . carvedilol (COREG) 25 MG tablet Take 1 tablet (25 mg total) by mouth 2 (two) times daily with a meal.  . Cholecalciferol (VITAMIN D-3 PO) Take 1,000 Int'l Units/day by mouth at bedtime.   Marland Kitchen CRANBERRY PO Take 100 mg by mouth at bedtime.   . DULoxetine (CYMBALTA) 20 MG capsule Give 2 capsules (40mg ) by mouth daily  . famotidine (PEPCID) 20 MG tablet Take 1 tablet (20 mg total) by mouth 2 (two) times daily.  . ferrous sulfate 325 (65 FE) MG tablet Take 325 mg by mouth daily with breakfast.  . fluticasone (FLONASE) 50 MCG/ACT nasal spray Place 2 sprays into both nostrils every evening.  Marland Kitchen GABAPENTIN PO Give 1000 mg by mouth two times daily.  . hydrocortisone cream 0.5 % Apply to cheeks topically two times a day for dry areas, avoid eyes  . ipratropium-albuterol (DUONEB) 0.5-2.5 (3) MG/3ML SOLN Inhale 3 mLs into the lungs every 6 (six) hours.  . Lactase (LACTAID PO) Take 1 tablet by mouth at bedtime.   . Lactulose 20 GM/30ML SOLN Take 30 mLs by mouth daily as needed.   . latanoprost (XALATAN) 0.005 % ophthalmic solution Place 1 drop into both eyes at bedtime.  Marland Kitchen linaclotide (LINZESS) 290 MCG  CAPS capsule Take 290 mcg by mouth daily before breakfast.   . loratadine (CLARITIN) 10 MG tablet Take 10 mg by mouth daily.  . Melatonin 5 MG TABS Take 2 tablets by mouth at bedtime.   . Menthol-Methyl Salicylate (MUSCLE RUB) 10-15 % CREA Apply to shoulders topically two times a day for pain  . mirabegron ER (MYRBETRIQ) 50 MG TB24 tablet Take 50 mg by mouth at bedtime.   . Nutritional Supplements (NUTRITIONAL SUPPLEMENT PO) Low Concentrated Sweets Diet - Regular texture  . ondansetron (ZOFRAN) 4 MG tablet Take 4 mg by mouth every 6 (six) hours as needed for nausea or vomiting.  . pantoprazole (PROTONIX) 40 MG tablet Take 40 mg by mouth daily.  . phenazopyridine (  PYRIDIUM) 100 MG tablet Take 100 mg by mouth 2 (two) times daily.   . polyethylene glycol (MIRALAX / GLYCOLAX) packet Take 17 g by mouth daily.  . potassium chloride (KLOR-CON) 20 MEQ packet Take 20 mEq by mouth daily.   . Probiotic Product (PROBIOTIC DAILY) CAPS Take 1 capsule by mouth daily. X 14 days  . Propylene Glycol (SYSTANE BALANCE) 0.6 % SOLN Instill 1 drop in both eyes two times a day for dry eyes  . senna-docusate (SENOKOT S) 8.6-50 MG tablet Take 2 tablets by mouth 2 (two) times daily.   . simethicone (MYLICON) 80 MG chewable tablet Chew 80 mg by mouth every 6 (six) hours as needed for flatulence.  Marland Kitchen spironolactone (ALDACTONE) 25 MG tablet Take 25 mg by mouth daily. For CHF  . tamsulosin (FLOMAX) 0.4 MG CAPS capsule Take 0.4 mg by mouth daily. For incontinence  . torsemide (DEMADEX) 20 MG tablet Take 20 mg by mouth daily.  Marland Kitchen zolpidem (AMBIEN) 5 MG tablet Take 1 tablet (5 mg total) by mouth at bedtime.  . [DISCONTINUED] baclofen (LIORESAL) 10 MG tablet Take 1 tablet (10 mg total) by mouth 3 (three) times daily. (Patient not taking: Reported on 02/24/2018)  . [DISCONTINUED] magnesium hydroxide (MILK OF MAGNESIA) 400 MG/5ML suspension Take 30 mLs by mouth every 8 (eight) hours as needed for mild constipation or moderate  constipation.   No facility-administered encounter medications on file as of 02/24/2018.     Review of Systems  Respiratory: Positive for cough and chest tightness.   Musculoskeletal: Positive for arthralgias and gait problem.  All other systems reviewed and are negative.   Immunization History  Administered Date(s) Administered  . Influenza,inj,Quad PF,6+ Mos 08/01/2013  . Influenza-Unspecified 08/31/2014, 08/09/2015, 05/19/2016, 05/01/2017  . PPD Test 03/23/2016, 03/30/2016  . Pneumococcal Polysaccharide-23 08/01/2013  . Pneumococcal-Unspecified 08/30/2013, 07/29/2014, 08/31/2014   Pertinent  Health Maintenance Due  Topic Date Due  . INFLUENZA VACCINE  05/27/2018 (Originally 01/30/2018)  . MAMMOGRAM  09/21/2018  . PAP SMEAR  Discontinued  . COLONOSCOPY  Discontinued   Fall Risk  02/13/2018 01/17/2017  Falls in the past year? No Yes  Number falls in past yr: - 1  Injury with Fall? - No   Functional Status Survey:    Vitals:   02/24/18 1238  BP: 129/79  Pulse: 93  Resp: 16  Temp: (!) 97.1 F (36.2 C)  SpO2: 96%  Weight: 273 lb (123.8 kg)  Height: 5\' 5"  (1.651 m)   Body mass index is 45.43 kg/m. Physical Exam  Constitutional: She is oriented to person, place, and time. She appears well-developed and well-nourished.  Sitting in w/c in NAD  HENT:  Mouth/Throat: Oropharynx is clear and moist. No oropharyngeal exudate.  MMM; no oral thrush  Eyes: Pupils are equal, round, and reactive to light. No scleral icterus.  Neck: Neck supple. Carotid bruit is not present. No tracheal deviation present. No thyromegaly present.  Cardiovascular: Normal rate, regular rhythm and intact distal pulses. Exam reveals no gallop and no friction rub.  Murmur (1/6 SEM) heard. No LE edema b/l. no calf TTP.   Pulmonary/Chest: Effort normal. No stridor. No respiratory distress. She has decreased breath sounds (b/l at base). She has no wheezes. She has no rales.  Abdominal: Soft. Normal  appearance and bowel sounds are normal. She exhibits no distension and no mass. There is no hepatomegaly. There is no tenderness. There is no rigidity, no rebound and no guarding. A hernia (ventral) is present.  obese  Musculoskeletal: She exhibits edema.  Lymphadenopathy:    She has no cervical adenopathy.  Neurological: She is alert and oriented to person, place, and time. She has normal reflexes.  Paraplegic  Skin: Skin is warm and dry. No rash noted.  Psychiatric: She has a normal mood and affect. Her behavior is normal. Judgment and thought content normal.    Labs reviewed: Recent Labs    12/10/17 12/26/17 0945 02/17/18 1621  NA 143 145* 142  K 3.8 3.9 4.8  CL  --  102 104  CO2  --  24 21  GLUCOSE  --  178* 220*  BUN 14 12 19   CREATININE 0.9 1.09* 1.34*  CALCIUM  --  9.4 9.4   Recent Labs    12/10/17 12/26/17 0945 02/17/18 1621  AST 17 21 22   ALT 16 22 21   ALKPHOS 149* 155* 187*  BILITOT  --  <0.2 <0.2  PROT  --  7.0 7.3  ALBUMIN  --  4.1 4.3   Recent Labs    10/31/17 12/26/17 0945 02/17/18 1621  WBC 7.8 5.3 10.1  NEUTROABS 6 3.5 9.1*  HGB 11.4* 10.4* 11.0*  HCT 34* 33.3* 33.8*  MCV  --  87 84  PLT 303 303 308   Lab Results  Component Value Date   TSH 2.87 03/03/2016   Lab Results  Component Value Date   HGBA1C 6.2 12/10/2017   Lab Results  Component Value Date   CHOL 183 12/10/2017   HDL 31 (A) 12/10/2017   LDLCALC 98 12/10/2017   TRIG 271 (A) 12/10/2017   CHOLHDL 6.3 05/01/2012    Significant Diagnostic Results in last 30 days:  No results found.  Assessment/Plan   ICD-10-CM   1. Weight gain R63.5   2. Chronic combined systolic and diastolic congestive heart failure (HCC) I50.42   3. Morbid obesity with BMI of 40.0-44.9, adult ([place]) E66.01    Z68.41   4. Paraplegia (HCC) G82.20   5. MULTIPLE SCLEROSIS, PROGRESSIVE/RELAPSING G35    Cont current meds as ordered  Daily weight x 2 weeks and record  F/u with specialists as  scheduled  PT/OT/ST as ordered  [facility] NP to follow  Will follow  Labs/tests ordered: cmp   Llesenia Fogal S. Perlie Gold  [facility] [address] [place], [place] [address] [phone] Cell (Monday-Friday 8 AM - 5 PM) [phone] After 5 PM and follow prompts

## 2018-04-17 ENCOUNTER — Ambulatory Visit: Payer: Self-pay | Admitting: Gastroenterology

## 2018-04-21 ENCOUNTER — Non-Acute Institutional Stay (SKILLED_NURSING_FACILITY): Payer: Medicare Other | Admitting: Internal Medicine

## 2018-04-21 ENCOUNTER — Encounter: Payer: Self-pay | Admitting: Internal Medicine

## 2018-04-21 DIAGNOSIS — I5042 Chronic combined systolic (congestive) and diastolic (congestive) heart failure: Secondary | ICD-10-CM | POA: Diagnosis not present

## 2018-04-21 DIAGNOSIS — Z6841 Body Mass Index (BMI) 40.0 and over, adult: Secondary | ICD-10-CM

## 2018-04-21 DIAGNOSIS — R739 Hyperglycemia, unspecified: Secondary | ICD-10-CM

## 2018-04-21 DIAGNOSIS — G894 Chronic pain syndrome: Secondary | ICD-10-CM | POA: Diagnosis not present

## 2018-04-21 DIAGNOSIS — G35 Multiple sclerosis: Secondary | ICD-10-CM

## 2018-04-21 DIAGNOSIS — I10 Essential (primary) hypertension: Secondary | ICD-10-CM

## 2018-04-21 DIAGNOSIS — K439 Ventral hernia without obstruction or gangrene: Secondary | ICD-10-CM | POA: Diagnosis not present

## 2018-04-21 DIAGNOSIS — G8220 Paraplegia, unspecified: Secondary | ICD-10-CM

## 2018-04-21 NOTE — Progress Notes (Addendum)
Patient ID: Shannon Garrett, female   DOB: May 03, 1961, 57 y.o.   MRN: [id number]   Location:  [facility] Room Number: 230 B Place of Service:  SNF [phone]) Provider:  [place], [place], DO  Patient Care Team: Gildardo Cranker, DO as PCP - General (Internal Medicine) Center, [place] ([place])  Extended Emergency Contact Information Primary Emergency Contact: [place], [place] [address] [place] Phone: [phone] Relation: Sister Secondary Emergency Contact: Jefm Bryant, [place] Phone: [phone] Mobile Phone: [phone] Relation: Friend  Code Status:  DNR Goals of care: Advanced Directive information Advanced Directives 04/21/2018  Does Patient Have a Medical Advance Directive? Yes  Type of Advance Directive Out of facility DNR (pink MOST or yellow form)  Does patient want to make changes to medical advance directive? No - Patient declined  Copy of [place] in Chart? -  Would patient like information on creating a medical advance directive? -  Pre-existing out of facility DNR order (yellow form or pink MOST form) Yellow form placed in chart (order not valid for inpatient use);Pink MOST form placed in chart (order not valid for inpatient use)     Chief Complaint  Patient presents with  . Medical Management of Chronic Issues    [facility]    HPI:  Pt is a 57 y.o. female seen today for medical management of chronic diseases.  She c/o abdominal pain and wonders if she can have hernia repaired. No N/V. No recent fall.   Glaucoma - stable on xalatan gtts to both eyes.    MS with paraplegia - slowly declining;  Last seen by neurology Dr Felecia Shelling 12/26/17; takes amantadine 100 mg daily baclofen 5 mg three times for muscle spasms;    Chronic pain syndrome - stable on neurontin 1000 mg twice daily; cymbalta 60 mg daily; tylenol  650 mg three times daily; topical biofreeze to left shoulder four times daily   Chronic combined systolic and diastolic heart failure - EF 55-60% (Mar 2019); Stable on demadex 20  mg daily with k+ 20 meq daily; aldactone 25 mg daily   Constipation - stable on senna s twice daily; linzess 145 mcg daily   Dyslipidemia - stable on lipitor 10 mg daily; LDL 98  Allergic rhinitis - stable on claritin 10 mg daily and flonase daily   Depression/insomnia - mood stable on cymbalta 60 mg daily; ambien 5  mg nightly for sleep   HTN - stable on coreg 25 mg twice daily  Neurogenic bladder - stable on flomax 0.4 mg daily;  myrbetriq 50 mg daily  Does not require I/o cath at this time.   GERD - stable on pepcid 20 mg twice daily   Hyperglycemia - a1c 6.2% in June 2019  Past Medical History:  Diagnosis Date  . Cardiomyopathy ([place])    a.  Echo 04/29/12: Mild LVH, EF 20-25%, mild AI, moderate MR, moderate LAE, mild RAE, mild RVE, moderate TR, PASP 51, small pericardial effusion;   b. probably non-ischemic given multiple chemo-Tx agents used for MS and global LV dysfn on echo  . Chronic systolic heart failure ([place])   . Depression   . Glaucoma   . Hypertension   . MS (multiple sclerosis) ([place])    a. Dx'd late 20's. b. Tx with Novantrone, Tysabri, Copaxone previously.  Past Surgical History:  Procedure Laterality Date  . ABLATION     uterine  . CESAREAN SECTION      Allergies  Allergen Reactions  . Sulfonamide Derivatives Hives and Shortness Of Breath  . Penicillins Hives and Itching    Has patient had a PCN reaction causing immediate rash, facial/tongue/throat swelling, SOB or lightheadedness with hypotension: unknown Has patient had a PCN reaction causing severe rash involving mucus membranes or skin necrosis: unknown Has patient had a PCN reaction that required hospitalization: unknown Has patient had a PCN reaction occurring within the last 10 years: unknown If all of the above answers  are "NO", then may proceed with Cephalosporin use. Prior course of rocephin charted 05/2015    Outpatient Encounter Medications as of 04/21/2018  Medication Sig  . acetaminophen (TYLENOL) 325 MG tablet Take 650 mg by mouth 3 (three) times daily.  Marland Kitchen albuterol (PROVENTIL) (2.5 MG/3ML) 0.083% nebulizer solution Take 3 mLs (2.5 mg total) by nebulization every 2 (two) hours as needed for wheezing.  Marland Kitchen amantadine (SYMMETREL) 100 MG capsule Take 100 mg by mouth daily.   Marland Kitchen atorvastatin (LIPITOR) 10 MG tablet Take 10 mg by mouth at bedtime.  . Baclofen 5 MG TABS Take 1 tablet by mouth 4 (four) times daily. Hold if lethargic or confused   . benzocaine (HURRICAINE) 20 % GEL Use as directed 1 application in the mouth or throat 2 (two) times daily.  Marland Kitchen buPROPion (WELLBUTRIN) 100 MG tablet Take 100 mg by mouth daily.  . carvedilol (COREG) 25 MG tablet Take 1 tablet (25 mg total) by mouth 2 (two) times daily with a meal.  . Cholecalciferol (VITAMIN D-3 PO) Take 1,000 Int'l Units/day by mouth at bedtime.   Marland Kitchen CRANBERRY PO Take 100 mg by mouth at bedtime.   . DULOXETINE HCL PO Take 90 mg by mouth daily.   . famotidine (PEPCID) 20 MG tablet Take 1 tablet (20 mg total) by mouth 2 (two) times daily.  . ferrous sulfate 325 (65 FE) MG tablet Take 325 mg by mouth daily with breakfast.  . fluticasone (FLONASE) 50 MCG/ACT nasal spray Place 2 sprays into both nostrils every evening.  Marland Kitchen GABAPENTIN PO Give 1000 mg by mouth two times daily.  . hydrocortisone cream 0.5 % Apply to cheeks topically two times a day for dry areas, avoid eyes  . ipratropium-albuterol (DUONEB) 0.5-2.5 (3) MG/3ML SOLN Inhale 3 mLs into the lungs every 6 (six) hours.  . Lactase (LACTAID PO) Take 1 tablet by mouth at bedtime.   Marland Kitchen latanoprost (XALATAN) 0.005 % ophthalmic solution Place 1 drop into both eyes at bedtime.  Marland Kitchen linaclotide (LINZESS) 290 MCG CAPS capsule Take 290 mcg by mouth daily before breakfast.   . loratadine (CLARITIN) 10 MG tablet  Take 10 mg by mouth daily.  . Melatonin 5 MG TABS Take 2 tablets by mouth at bedtime.   . Menthol-Methyl Salicylate (MUSCLE RUB) 10-15 % CREA Apply to shoulders topically two times a day for pain  . mirabegron ER (MYRBETRIQ) 50 MG TB24 tablet Take 50 mg by mouth at bedtime.   . Nutritional Supplements (NUTRITIONAL SUPPLEMENT PO) Low Concentrated Sweets Diet - Regular texture  . ondansetron (ZOFRAN) 4 MG tablet Take 4 mg by mouth every 6 (six) hours as needed for nausea or vomiting.  . pantoprazole (PROTONIX) 40 MG tablet Take 40 mg by mouth daily.  . phenazopyridine (PYRIDIUM) 100 MG tablet Take 100 mg by mouth 2 (two) times daily.   . potassium  chloride (KLOR-CON) 20 MEQ packet Take 20 mEq by mouth daily.   . Probiotic CAPS Take 1 capsule by mouth 2 (two) times daily. X 30 days ending on 05/07/18  . Propylene Glycol (SYSTANE BALANCE) 0.6 % SOLN Instill 1 drop in both eyes two times a day for dry eyes  . senna-docusate (SENOKOT S) 8.6-50 MG tablet Take 2 tablets by mouth 2 (two) times daily.   . simethicone (MYLICON) 80 MG chewable tablet Chew 80 mg by mouth every 6 (six) hours as needed for flatulence.  Marland Kitchen spironolactone (ALDACTONE) 25 MG tablet Take 25 mg by mouth daily. For CHF  . tamsulosin (FLOMAX) 0.4 MG CAPS capsule Take 0.4 mg by mouth daily. For incontinence  . torsemide (DEMADEX) 20 MG tablet Take 20 mg by mouth daily.  Marland Kitchen zolpidem (AMBIEN) 5 MG tablet Take 1 tablet (5 mg total) by mouth at bedtime.  . [DISCONTINUED] Lactulose 20 GM/30ML SOLN Take 30 mLs by mouth daily as needed.   . [DISCONTINUED] polyethylene glycol (MIRALAX / GLYCOLAX) packet Take 17 g by mouth daily.   No facility-administered encounter medications on file as of 04/21/2018.     Review of Systems  Gastrointestinal: Positive for abdominal pain.  Musculoskeletal: Positive for arthralgias and gait problem.  All other systems reviewed and are negative.   Immunization History  Administered Date(s) Administered  .  Influenza,inj,Quad PF,6+ Mos 08/01/2013  . Influenza-Unspecified 08/31/2014, 08/09/2015, 05/19/2016, 05/01/2017, 04/21/2018  . PPD Test 03/23/2016, 03/30/2016  . Pneumococcal Polysaccharide-23 08/01/2013, 03/27/2018  . Pneumococcal-Unspecified 08/30/2013, 07/29/2014, 08/31/2014   Pertinent  Health Maintenance Due  Topic Date Due  . INFLUENZA VACCINE  05/27/2018 (Originally 01/30/2018)  . MAMMOGRAM  09/21/2018  . PAP SMEAR  Discontinued  . COLONOSCOPY  Discontinued   Fall Risk  02/13/2018 01/17/2017  Falls in the past year? No Yes  Number falls in past yr: - 1  Injury with Fall? - No   Functional Status Survey:    Vitals:   04/21/18 1306  BP: 130/70  Pulse: 74  Resp: 18  Temp: (!) 97.4 F (36.3 C)  SpO2: 96%  Weight: 263 lb 6.4 oz (119.5 kg)  Height: 5\' 5"  (1.651 m)   Body mass index is 43.83 kg/m. Physical Exam  Constitutional: She is oriented to person, place, and time. She appears well-developed and well-nourished.  Sitting in w/c in NAD  HENT:  Mouth/Throat: Oropharynx is clear and moist. No oropharyngeal exudate.  MMM; no oral thrush  Eyes: Pupils are equal, round, and reactive to light. No scleral icterus.  Neck: Neck supple. Carotid bruit is not present. No tracheal deviation present. No thyromegaly present.  Cardiovascular: Normal rate, regular rhythm and intact distal pulses. Exam reveals no gallop and no friction rub.  Murmur (1/6 SEM) heard. No LE edema b/l. no calf TTP.   Pulmonary/Chest: Effort normal. No stridor. No respiratory distress. She has decreased breath sounds (b/l at base). She has no wheezes. She has no rales. She exhibits no tenderness.  Abdominal: Soft. Normal appearance and bowel sounds are normal. She exhibits distension (full). She exhibits no mass. There is no hepatomegaly. There is tenderness (epigastric). There is no rigidity, no rebound and no guarding. A hernia (ventral; nonreducible) is present.  obese  Musculoskeletal: She exhibits  edema.  Lymphadenopathy:    She has no cervical adenopathy.  Neurological: She is alert and oriented to person, place, and time. She has normal reflexes.  Paraplegic  Skin: Skin is warm and dry. No rash noted.  Psychiatric: She has a normal mood and affect. Her behavior is normal. Judgment and thought content normal.    Labs reviewed: Recent Labs    12/26/17 0945 02/17/18 1621 02/18/18  NA 145* 142 140  K 3.9 4.8 4.4  CL 102 104  --   CO2 24 21  --   GLUCOSE 178* 220*  --   BUN 12 19 19   CREATININE 1.09* 1.34* 1.1  CALCIUM 9.4 9.4  --    Recent Labs    12/10/17 12/26/17 0945 02/17/18 1621  AST 17 21 22   ALT 16 22 21   ALKPHOS 149* 155* 187*  BILITOT  --  <0.2 <0.2  PROT  --  7.0 7.3  ALBUMIN  --  4.1 4.3   Recent Labs    12/26/17 0945 02/17/18 1621 02/18/18  WBC 5.3 10.1 10.0  NEUTROABS 3.5 9.1* 9  HGB 10.4* 11.0* 10.2*  HCT 33.3* 33.8* 31*  MCV 87 84  --   PLT 303 308 301   Lab Results  Component Value Date   TSH 2.87 03/03/2016   Lab Results  Component Value Date   HGBA1C 6.2 12/10/2017   Lab Results  Component Value Date   CHOL 183 12/10/2017   HDL 31 (A) 12/10/2017   LDLCALC 98 12/10/2017   TRIG 271 (A) 12/10/2017   CHOLHDL 6.3 05/01/2012    Significant Diagnostic Results in last 30 days:  No results found.  Assessment/Plan   ICD-10-CM   1. Ventral hernia without obstruction or gangrene K43.9   2. Chronic combined systolic and diastolic congestive heart failure (HCC) I50.42   3. MULTIPLE SCLEROSIS, PROGRESSIVE/RELAPSING G35   4. Chronic pain syndrome G89.4   5. Paraplegia (HCC) G82.20   6. Hyperglycemia R73.9   7. Essential hypertension I10   8. Morbid obesity with BMI of 40.0-44.9, adult (HCC) E66.01    Z68.41     D/w [facility] NP - abdominal hernia repair not an option as she is a poor surgical candidate due to multiple co-morbidities. Manage pain.   Cont current meds as ordered  PT/OT/ST as indicated  F/u with specialists as  scheduled  [facility] NP to follow   Will follow  Labs/tests ordered: none   Akshaya Toepfer S. Perlie Gold  [facility] [address] [place], [place] [address] [phone] Cell (Monday-Friday 8 AM - 5 PM) [phone] After 5 PM and follow prompts

## 2018-04-23 ENCOUNTER — Encounter: Payer: Self-pay | Admitting: Internal Medicine

## 2018-07-09 ENCOUNTER — Ambulatory Visit: Payer: Medicare Other | Admitting: Neurology

## 2018-07-10 ENCOUNTER — Encounter: Payer: Self-pay | Admitting: Neurology

## 2018-07-28 ENCOUNTER — Encounter: Payer: Self-pay | Admitting: *Deleted

## 2018-07-28 ENCOUNTER — Telehealth: Payer: Self-pay | Admitting: *Deleted

## 2018-07-28 NOTE — Telephone Encounter (Signed)
Sent pt letter since unable to reach by phone and no DPR on file.

## 2018-07-28 NOTE — Telephone Encounter (Signed)
Tried calling pt on both numbers listed: [phone] (W) [phone] (M) but unable to reach. No DPR on file.   We received refill request for rx Ocrevus. I spoke with intrafusion and her last infusion was 02/17/2018. She is due for next infusion: 08/20/2018. She has no pending f/u. She last saw Dr. Felecia Shelling 12/26/2017.

## 2018-08-12 ENCOUNTER — Telehealth: Payer: Self-pay

## 2018-08-12 NOTE — Telephone Encounter (Signed)
I tried calling back. Called connected but line was silent.

## 2018-08-12 NOTE — Telephone Encounter (Signed)
Nursing supervisor called trying to schedule an appointment but I didn't see anything available. Patient goes to dialysis on Monday, Wednesday and Friday, so they prefer the appointment to be on Tuesday or Thursday. Please advise.

## 2018-08-14 NOTE — Telephone Encounter (Signed)
I called Jolane back and scheduled appt for 08/26/18 at 9am, check in 830am. She verbalized understanding and appreciation for call.

## 2018-08-26 ENCOUNTER — Ambulatory Visit (INDEPENDENT_AMBULATORY_CARE_PROVIDER_SITE_OTHER): Payer: Medicare Other | Admitting: Neurology

## 2018-08-26 ENCOUNTER — Encounter: Payer: Self-pay | Admitting: Neurology

## 2018-08-26 VITALS — BP 112/77 | HR 84 | Ht 65.0 in

## 2018-08-26 DIAGNOSIS — G8220 Paraplegia, unspecified: Secondary | ICD-10-CM | POA: Diagnosis not present

## 2018-08-26 DIAGNOSIS — G4700 Insomnia, unspecified: Secondary | ICD-10-CM | POA: Diagnosis not present

## 2018-08-26 DIAGNOSIS — I63012 Cerebral infarction due to thrombosis of left vertebral artery: Secondary | ICD-10-CM

## 2018-08-26 DIAGNOSIS — N319 Neuromuscular dysfunction of bladder, unspecified: Secondary | ICD-10-CM

## 2018-08-26 DIAGNOSIS — G894 Chronic pain syndrome: Secondary | ICD-10-CM

## 2018-08-26 DIAGNOSIS — G35 Multiple sclerosis: Secondary | ICD-10-CM

## 2018-08-26 DIAGNOSIS — Z79899 Other long term (current) drug therapy: Secondary | ICD-10-CM

## 2018-08-26 DIAGNOSIS — R208 Other disturbances of skin sensation: Secondary | ICD-10-CM | POA: Insufficient documentation

## 2018-08-26 NOTE — Progress Notes (Signed)
[facility]  PATIENT: Shannon Garrett DOB: 08-Oct-1960  REFERRING DOCTOR OR PCP:  Gildardo Cranker SOURCE: Patient, records from EMR, MRI images on PACS.  _________________________________   HISTORICAL  CHIEF COMPLAINT:  Chief Complaint  Patient presents with  . Follow-up    RM 12, alone. Last seen 12/26/17. She reports bladder pain. She saw urologist last week.   . Gait Problem    Wheelchair bound  . Multiple Sclerosis    Last Ocrevus infusion 02/17/18. Was due 08/20/18     HISTORY OF PRESENT ILLNESS:  Shannon Garrett is a 57 y.o. woman with multiple sclerosis.      Update 08/26/2018: She is on Ocrevus therapy.  Her last infusion was 02/17/2018 and she is due around now for her next infusion.  She has tolerated it well.  IgG/IgM are fine.   She felt she got some benefit with stabilization initially.  She has not had any exacerbations.    She is wheelchair bound due to severe bilateral leg weakness and spasticity.    She has moderately severe left arm weakness and spasticity that is stable.   The right arm works well with near normal strength.  She is having a lot of urinary spasms and has had bladder infections.   She has seen urology and is now on Myrbetriq.   Vision is ok.     She has more fatigue but she notes more difficulty with insomnia --- both onset and maintenance.   She takes melatonin and Ambien 5 mg.   We dicussed increasing the Ambien.   Mood is ok with stable mild depression .   She has pain below the waist.    She is on gabapentin 1000 mg bid    Update 12/26/2017: She started Ocrevus October 2017.  She has tolerated it well and felt much she was stronger for a couple months.  Her last infusion was about 6 months ago.   She denies any exacerbations but feels she is a little weaker.  She has good use of the right arm but is weak in the left arm and both legs.   She is wheelchair-bound.  She notes spasticity in her legs.  Baclofen has helped some.  She notes  speech is softer and swallowing is sometimes off.   She sees Speech therapy.      She has had UTIs and has urinary hesitancy.  She is on Flomax, pyridium and Myrbetriq.    She gets intermittent catheterization.    We discussed a suprapubic catheter.   She has gained weight, reporting about 15 pounds over the last year   From 09/26/2016:  MS:   She had the first split dose of Ocrelizumab in October.   She tolerated it well and there were no complications.   She feels she may be a little bit stronger.   Recent bloodwork in EMR.     She is feeling weaker in her left arm and legs are about the same.    She is on Tysabri and tolerates it well. However, she has missed some doses due to a hospitalization for UTI and NH not bringing her to last scheduled infusion.     Gait/strength/sensation: She is unable to walk and uses a wheelchair much of the day.  She cannot transfer but can assist.   Her left leg is weaker than her right.   She also has milder weakness in her hands, left > right .   She has left >  right, lefg > arm spasticity.    She denies  numbness but occasionally will get some tingling in the left arm and leg.   She has spasticity, helped by baclofen a little bit.      Bladder/bowel:   She notes bladder spasms that are only partially helped by Myrbetriq. She has some retention and has some benefit from tamsulosin.  and has had more UTI's lately.   She has urinary incontinence at times and wears diapers at her current nursing home.   She has bad constipation.     Miralax has nothelped  Vision: She denies any major problem with her vision.  Fatigue/sleep: She has physical and cognitive fatigue daily.   She denies excessive sleepiness..   Her sleep onset insomnia is worse than sleep maintenance.    Ambien helped her sleep a lot more than ramelteon.  Mood/cognition: She notes depression.  She gets irritable and gets frustrated easily.    This is worse if she sleeps poorly.   She denies anxiety.   She sometimes has crying spells.   She is on Cymbalta and tolerates it well.   She denies any major problems with cognition but feels she was able to think and process information better in the past.  Pain:  She reports pain in all 4 limbs and in her back. The pain is worse in the left arm.Percocet helped her in the past.    MS history:     She was diagnosed with multiple sclerosis in the 1980s after presenting with optic neuritis, numbness in her legs and vertigo MRI was consistent with MS. Initially she was not placed on any medication as none were approved. She had multiple exacerbations and by the 1990s was having difficulty with her gait initially she was placed on Avonex and remain on that medication for many years.   She thinks I first saw her about 8 years ago.   She thinks she was switched to Tysabri but continued to have some progression. Therefore, Novantrone was tried.   She feels that Novantrone did slow her progression down significantly. However, this needed to be stopped after she developed congestive heart failure.  She switched her care to Dr. Jacqulynn Cadet and went back on Tysabri therapy but stopped seeing him when he moved to the [place] area about 1-1/2 years ago.       REVIEW OF SYSTEMS: Constitutional: No fevers, chills, sweats, or change in appetite.  Notes fatigue and insomnia Eyes: No visual changes, double vision, eye pain Ear, nose and throat: No hearing loss, ear pain, nasal congestion, sore throat Cardiovascular: No chest pain, palpitations Respiratory: No shortness of breath at rest or with exertion.   No wheezes GastrointestinaI: No nausea, vomiting, diarrhea, abdominal pain, fecal incontinence Genitourinary: as above Musculoskeletal: Some neck pain, back pain Integumentary: No rash, pruritus, skin lesions Neurological: as above Psychiatric: as above Endocrine: No palpitations, diaphoresis, change in appetite, change in weigh or increased  thirst Hematologic/Lymphatic: No anemia, purpura, petechiae. Allergic/Immunologic: No itchy/runny eyes, nasal congestion, recent allergic reactions, rashes  ALLERGIES: Allergies  Allergen Reactions  . Sulfonamide Derivatives Hives and Shortness Of Breath  . Penicillins Hives and Itching    Has patient had a PCN reaction causing immediate rash, facial/tongue/throat swelling, SOB or lightheadedness with hypotension: unknown Has patient had a PCN reaction causing severe rash involving mucus membranes or skin necrosis: unknown Has patient had a PCN reaction that required hospitalization: unknown Has patient had a PCN reaction occurring within the last  10 years: unknown If all of the above answers are "NO", then may proceed with Cephalosporin use. Prior course of rocephin charted 05/2015    HOME MEDICATIONS:  Current Outpatient Medications:  .  acetaminophen (TYLENOL) 325 MG tablet, Take 650 mg by mouth 3 (three) times daily., Disp: , Rfl:  .  albuterol (PROVENTIL) (2.5 MG/3ML) 0.083% nebulizer solution, Take 3 mLs (2.5 mg total) by nebulization every 2 (two) hours as needed for wheezing., Disp: 75 mL, Rfl: 12 .  amantadine (SYMMETREL) 100 MG capsule, Take 100 mg by mouth daily. , Disp: , Rfl:  .  atorvastatin (LIPITOR) 10 MG tablet, Take 10 mg by mouth at bedtime., Disp: , Rfl:  .  Baclofen 5 MG TABS, Take 1 tablet by mouth 4 (four) times daily. Hold if lethargic or confused , Disp: , Rfl:  .  benzocaine (HURRICAINE) 20 % GEL, Use as directed 1 application in the mouth or throat 2 (two) times daily., Disp: , Rfl:  .  buPROPion (WELLBUTRIN) 100 MG tablet, Take 100 mg by mouth daily., Disp: , Rfl:  .  carvedilol (COREG) 25 MG tablet, Take 1 tablet (25 mg total) by mouth 2 (two) times daily with a meal., Disp: , Rfl:  .  Cholecalciferol (VITAMIN D-3 PO), Take 1,000 Int'l Units/day by mouth at bedtime. , Disp: , Rfl:  .  CRANBERRY PO, Take 100 mg by mouth at bedtime. , Disp: , Rfl:  .   DULOXETINE HCL PO, Take 90 mg by mouth daily. , Disp: , Rfl:  .  famotidine (PEPCID) 20 MG tablet, Take 1 tablet (20 mg total) by mouth 2 (two) times daily., Disp: , Rfl:  .  ferrous sulfate 325 (65 FE) MG tablet, Take 325 mg by mouth daily with breakfast., Disp: , Rfl:  .  fluticasone (FLONASE) 50 MCG/ACT nasal spray, Place 2 sprays into both nostrils every evening., Disp: , Rfl:  .  GABAPENTIN PO, Give 1000 mg by mouth two times daily., Disp: , Rfl:  .  hydrocortisone cream 0.5 %, Apply to cheeks topically two times a day for dry areas, avoid eyes, Disp: , Rfl:  .  ipratropium-albuterol (DUONEB) 0.5-2.5 (3) MG/3ML SOLN, Inhale 3 mLs into the lungs every 6 (six) hours., Disp: , Rfl:  .  Lactase (LACTAID PO), Take 1 tablet by mouth at bedtime. , Disp: , Rfl:  .  latanoprost (XALATAN) 0.005 % ophthalmic solution, Place 1 drop into both eyes at bedtime., Disp: , Rfl:  .  linaclotide (LINZESS) 290 MCG CAPS capsule, Take 290 mcg by mouth daily before breakfast. , Disp: , Rfl:  .  loratadine (CLARITIN) 10 MG tablet, Take 10 mg by mouth daily., Disp: , Rfl:  .  Melatonin 5 MG TABS, Take 2 tablets by mouth at bedtime. , Disp: , Rfl:  .  Menthol, Topical Analgesic, (BENGAY EX), Apply topically. Apply to shoulders every 12 hours as needed for pain, Disp: , Rfl:  .  mirabegron ER (MYRBETRIQ) 50 MG TB24 tablet, Take 50 mg by mouth at bedtime. , Disp: , Rfl:  .  omeprazole (PRILOSEC) 20 MG capsule, Take 20 mg by mouth daily., Disp: , Rfl:  .  ondansetron (ZOFRAN) 4 MG tablet, Take 4 mg by mouth every 6 (six) hours as needed for nausea or vomiting., Disp: , Rfl:  .  phenazopyridine (PYRIDIUM) 100 MG tablet, Take 100 mg by mouth 2 (two) times daily. , Disp: , Rfl:  .  potassium chloride (KLOR-CON) 20 MEQ packet, Take 20  mEq by mouth daily. , Disp: , Rfl:  .  Propylene Glycol (SYSTANE BALANCE) 0.6 % SOLN, Instill 1 drop in both eyes two times a day for dry eyes, Disp: , Rfl:  .  senna-docusate (SENOKOT S) 8.6-50  MG tablet, Take 2 tablets by mouth 2 (two) times daily. , Disp: , Rfl:  .  spironolactone (ALDACTONE) 25 MG tablet, Take 25 mg by mouth daily. For CHF, Disp: , Rfl:  .  tamsulosin (FLOMAX) 0.4 MG CAPS capsule, Take 0.4 mg by mouth daily. For incontinence, Disp: , Rfl:  .  torsemide (DEMADEX) 20 MG tablet, Take 20 mg by mouth daily., Disp: , Rfl:  .  traMADol (ULTRAM) 50 MG tablet, Take 50 mg by mouth every 8 (eight) hours as needed., Disp: , Rfl:  .  Menthol-Methyl Salicylate (MUSCLE RUB) 10-15 % CREA, Apply to shoulders topically two times a day for pain, Disp: , Rfl:  .  pantoprazole (PROTONIX) 40 MG tablet, Take 40 mg by mouth daily., Disp: , Rfl:   PAST MEDICAL HISTORY: Past Medical History:  Diagnosis Date  . Cardiomyopathy ([place])    a.  Echo 04/29/12: Mild LVH, EF 20-25%, mild AI, moderate MR, moderate LAE, mild RAE, mild RVE, moderate TR, PASP 51, small pericardial effusion;   b. probably non-ischemic given multiple chemo-Tx agents used for MS and global LV dysfn on echo  . Chronic systolic heart failure ([place])   . Depression   . Glaucoma   . Hypertension   . MS (multiple sclerosis) ([place])    a. Dx'd late 20's. b. Tx with Novantrone, Tysabri, Copaxone previously.    PAST SURGICAL HISTORY: Past Surgical History:  Procedure Laterality Date  . ABLATION     uterine  . CESAREAN SECTION      FAMILY HISTORY: Family History  Problem Relation Age of Onset  . Hypertension Mother   . Cancer Mother        breast   . Cancer Father        prostate  . Multiple sclerosis Sister   . Heart attack Neg Hx     SOCIAL HISTORY:  Social History   Socioeconomic History  . Marital status: Single    Spouse name: Not on file  . Number of children: Not on file  . Years of education: Not on file  . Highest education level: Not on file  Occupational History  . Not on file  Social Needs  . Financial resource strain: Not hard at all  . Food insecurity:    Worry: Never true    Inability:  Never true  . Transportation needs:    Medical: No    Non-medical: No  Tobacco Use  . Smoking status: Former Smoker    Packs/day: 0.50    Years: 5.00    Pack years: 2.50  . Smokeless tobacco: Never Used  Substance and Sexual Activity  . Alcohol use: No  . Drug use: No  . Sexual activity: Not Currently  Lifestyle  . Physical activity:    Days per week: 0 days    Minutes per session: 0 min  . Stress: Only a little  Relationships  . Social connections:    Talks on phone: More than three times a week    Gets together: Twice a week    Attends religious service: Never    Active member of club or organization: No    Attends meetings of clubs or organizations: Never    Relationship status: Never married  .  Intimate partner violence:    Fear of current or ex partner: No    Emotionally abused: No    Physically abused: No    Forced sexual activity: No  Other Topics Concern  . Not on file  Social History Narrative  . Not on file     PHYSICAL EXAM  Vitals:   08/26/18 0913  BP: 112/77  Pulse: 84  SpO2: 95%  Height: 5\' 5"  (1.651 m)    Body mass index is 43.83 kg/m.   General: The patient is well-developed and well-nourished and in no acute distress  Ext: Extremities show mild ankle edema.   Neurologic Exam  Mental status: The patient is alert and oriented x 3 at the time of the examination. The patient has apparent normal recent and remote memory, with an apparently normal attention span and concentration ability.   Speech is normal.  Cranial nerves: Extraocular movements are full.  Facial strength and sensation was normal.  The tongue is midline, and the patient has symmetric elevation of the soft palate. No obvious hearing deficits are noted.  Motor:  Muscle bulk is normal.   Tone is increase in the legs greater than the arms, left worse than right. Strength is 4+/5 in the right arm, 3/5 in the left arm, 2-/5 in the right leg and 1/5 in the left leg.  This is  unchanged.  Sensory: Sensory testing is intact to touch and vibration sensation in all 4 extremities.  Coordination: Cerebellar testing reveals goodright finger-nose-finger.   Left finger-nose finger is poor.  She cannot do heel-to-shin.  Gait and station: Wheelchair bound   DTRs:   Deep tendon reflexes are increased in all 4 limbs.  There is clonus at the ankles.  DIAGNOSTIC DATA (LABS, IMAGING, TESTING) - I reviewed patient records, labs, notes, testing and imaging myself where available.  Lab Results  Component Value Date   WBC 10.0 02/18/2018   HGB 10.2 (A) 02/18/2018   HCT 31 (A) 02/18/2018   MCV 84 02/17/2018   PLT 301 02/18/2018      Component Value Date/Time   NA 140 02/18/2018   K 4.4 02/18/2018   CL 104 02/17/2018 1621   CO2 21 02/17/2018 1621   GLUCOSE 220 (H) 02/17/2018 1621   GLUCOSE 112 (H) 11/09/2015 0441   BUN 19 02/18/2018   CREATININE 1.1 02/18/2018   CREATININE 1.34 (H) 02/17/2018 1621   CALCIUM 9.4 02/17/2018 1621   PROT 7.3 02/17/2018 1621   ALBUMIN 4.3 02/17/2018 1621   AST 22 02/17/2018 1621   ALT 21 02/17/2018 1621   ALKPHOS 187 (H) 02/17/2018 1621   BILITOT <0.2 02/17/2018 1621   GFRNONAA 45 (L) 02/17/2018 1621   GFRAA 51 (L) 02/17/2018 1621   Lab Results  Component Value Date   CHOL 183 12/10/2017   HDL 31 (A) 12/10/2017   LDLCALC 98 12/10/2017   TRIG 271 (A) 12/10/2017   CHOLHDL 6.3 05/01/2012   Lab Results  Component Value Date   HGBA1C 6.2 12/10/2017   Lab Results  Component Value Date   VITAMINB12 2,449 (H) 07/10/2015   Lab Results  Component Value Date   TSH 2.87 03/03/2016       ASSESSMENT AND PLAN  Multiple sclerosis (HCC)  Paraplegia (HCC)  Neurogenic bladder  Insomnia, unspecified type  High risk medication use  Chronic pain syndrome  Dysesthesia   1.   Ocrevus 600 mg.   We did discuss stepping down to Aubagio as her risk of infection  may be higher on Ocrevus.   For now, she would prefer to stsy on  Ocrevus as she feels it has stabilized her.    2.   Continue other medications.  Change gabapentin to 800 mg po tid.   3.   Increase baclofen to 10 mg 3 times daily 4.   Continue to see Urology for bladder 5.   Increase Ambien to 10 mg  6.   She will return in about 5-6 months or sooner if she has new or worsening neurologic symptoms.  40 minutes face-to-face visit with greater than 50% of the time counseling and coordinating care about her secondary progressive MS, treatment options, prognosis and other issues related to her MS.  Jevon Littlepage A. Felecia Shelling, MD, PhD 5/99/7741, 4:23 AM Certified in Neurology, Clinical Neurophysiology, Sleep Medicine, Pain Medicine and Neuroimaging  [facility] [address], [place], [place] [address] [phone]

## 2018-08-27 ENCOUNTER — Telehealth: Payer: Self-pay | Admitting: *Deleted

## 2018-08-27 LAB — COMPREHENSIVE METABOLIC PANEL
A/G RATIO: 1.6 (ref 1.2–2.2)
ALBUMIN: 3.9 g/dL (ref 3.8–4.9)
ALK PHOS: 191 IU/L — AB (ref 39–117)
ALT: 30 IU/L (ref 0–32)
AST: 20 IU/L (ref 0–40)
BILIRUBIN TOTAL: 0.2 mg/dL (ref 0.0–1.2)
BUN / CREAT RATIO: 9 (ref 9–23)
BUN: 10 mg/dL (ref 6–24)
CHLORIDE: 101 mmol/L (ref 96–106)
CO2: 22 mmol/L (ref 20–29)
Calcium: 9.7 mg/dL (ref 8.7–10.2)
Creatinine, Ser: 1.17 mg/dL — ABNORMAL HIGH (ref 0.57–1.00)
GFR calc Af Amer: 61 mL/min/{1.73_m2} (ref >59)
GFR calc non Af Amer: 53 mL/min/{1.73_m2} — ABNORMAL LOW (ref >59)
GLOBULIN, TOTAL: 2.5 g/dL (ref 1.5–4.5)
GLUCOSE: 242 mg/dL — AB (ref 65–99)
POTASSIUM: 4.6 mmol/L (ref 3.5–5.2)
SODIUM: 140 mmol/L (ref 134–144)
TOTAL PROTEIN: 6.4 g/dL (ref 6.0–8.5)

## 2018-08-27 LAB — TSH: TSH: 3.18 u[IU]/mL (ref 0.450–4.500)

## 2018-08-27 LAB — CBC WITH DIFFERENTIAL/PLATELET
BASOS ABS: 0.1 10*3/uL (ref 0.0–0.2)
BASOS: 1 %
EOS (ABSOLUTE): 0.9 10*3/uL — ABNORMAL HIGH (ref 0.0–0.4)
Eos: 14 %
HEMATOCRIT: 32.2 % — AB (ref 34.0–46.6)
HEMOGLOBIN: 10.7 g/dL — AB (ref 11.1–15.9)
Immature Grans (Abs): 0 10*3/uL (ref 0.0–0.1)
Immature Granulocytes: 1 %
LYMPHS ABS: 0.9 10*3/uL (ref 0.7–3.1)
Lymphs: 14 %
MCH: 29.2 pg (ref 26.6–33.0)
MCHC: 33.2 g/dL (ref 31.5–35.7)
MCV: 88 fL (ref 79–97)
Monocytes Absolute: 0.5 10*3/uL (ref 0.1–0.9)
Monocytes: 7 %
NEUTROS ABS: 4.1 10*3/uL (ref 1.4–7.0)
Neutrophils: 63 %
Platelets: 271 10*3/uL (ref 150–450)
RBC: 3.67 x10E6/uL — ABNORMAL LOW (ref 3.77–5.28)
RDW: 14.2 % (ref 11.7–15.4)
WBC: 6.5 10*3/uL (ref 3.4–10.8)

## 2018-08-27 LAB — IGG, IGA, IGM
IGG (IMMUNOGLOBIN G), SERUM: 890 mg/dL (ref 700–1600)
IgA/Immunoglobulin A, Serum: 362 mg/dL — ABNORMAL HIGH (ref 87–352)
IgM (Immunoglobulin M), Srm: 34 mg/dL (ref 26–217)

## 2018-08-27 NOTE — Telephone Encounter (Signed)
-----   Message from Britt Bottom, MD sent at 08/27/2018  2:24 PM EST ----- Please let the patient know that most lab work is ok.    Sugar was elevated from her DM, kidney function was slightly reduced and she had a mild anemia

## 2018-08-27 NOTE — Telephone Encounter (Signed)
[facility] and spoke with pt nurse about results. Faxed results to them at [phone]. Received fax confirmation.

## 2018-09-12 ENCOUNTER — Other Ambulatory Visit: Payer: Self-pay

## 2018-09-12 NOTE — Patient Outreach (Signed)
[facility]) Care Management  09/12/2018  Johneisha Broaden [birth year] [id number]   Medication Adherence call to Mrs. Drema Pry patient is at a home care at this time and they provide with all her medications patient is showing past due on Atorvastatin 10 mg. Mrs. Imagene Gurney is showing past due under [place].   [place] Management Direct Dial [phone]  Fax [phone] Xaivier [email or web address]

## 2018-10-09 ENCOUNTER — Telehealth: Payer: Self-pay | Admitting: *Deleted

## 2018-10-09 NOTE — Telephone Encounter (Signed)
Spoke with Terrilee Croak, RN (intrafusion). She was supposed to come today for her Ocrevus infusion however she is living at [facility]. Address: [place], [place], [place] [address]. Phone: [phone].  They are unable to have resident's leave facility right now d/t covid-19. Liana, RN called their facility and LVM for schedulers to call once pt able to leave facility and they can get her scheduled for infusion.

## 2018-10-23 ENCOUNTER — Ambulatory Visit: Payer: Medicare Other | Admitting: Neurology

## 2019-01-07 ENCOUNTER — Other Ambulatory Visit: Payer: Self-pay | Admitting: Neurology

## 2019-01-07 NOTE — Telephone Encounter (Signed)
Received refill request for pt's Ocrevus. Reached out to Liane with intrafusion. She has not heard back yet from nursing home to get infusion r/s. She will reach out today to nursing home.

## 2019-02-24 ENCOUNTER — Encounter: Payer: Self-pay | Admitting: Neurology

## 2019-02-24 ENCOUNTER — Telehealth: Payer: Self-pay | Admitting: *Deleted

## 2019-02-24 ENCOUNTER — Ambulatory Visit: Payer: Medicare Other | Admitting: Neurology

## 2019-02-24 NOTE — Telephone Encounter (Signed)
Dr. Felecia Shelling- pt no showed f/u today. She is in a facility. She was scheduled to have Ocrevus infusion last week by ambulance but showed up without help and not by ambulance. They were unable to do infusion. Dr. Felecia Shelling aware. Unable to use specialty infusion group for infusion since she is in facility.

## 2019-03-02 NOTE — Telephone Encounter (Signed)
Called [facility]. Address: [address], [place], [place] [address]. Phone: [phone]. LVM for Sheri to call office back about needing to fax Aubagio start form for pt to sign and have them fax back to [place]. Needing fax number for this.

## 2019-03-02 NOTE — Telephone Encounter (Signed)
I spoke to Ms. Jago.  I am going to switch her from [place] to [place].  She had a TB test in 2017 and liver function tests earlier this year so we are fine as well as blood work.  Please mail or fax the Aubagio form to her so she can sign it.  Then we can get her started.

## 2019-03-03 NOTE — Telephone Encounter (Signed)
Called, LVM again for facility to call.

## 2019-03-05 NOTE — Addendum Note (Signed)
Addended by: Hope Pigeon on: 03/05/2019 04:55 PM   Modules accepted: Orders

## 2019-03-05 NOTE — Telephone Encounter (Signed)
Faxed start form to fax [phone]. Marked urgent. Waiting on signed form to be sent back to [place] via fax.

## 2019-03-05 NOTE — Telephone Encounter (Signed)
Received signed start form from [place]. Faxed completed form to [employer] at [phone]. Received fax confirmation.

## 2019-03-05 NOTE — Telephone Encounter (Signed)
Called facility again. Was able to speak with [facility]. She asked I fax start form to their business office at [phone] and they will make sure pt signs.

## 2019-03-10 NOTE — Telephone Encounter (Signed)
Received fax notification from [facility] that no PA required for Aubagio. Pt does not qualify for copay assistance or one start program.

## 2019-03-11 ENCOUNTER — Other Ambulatory Visit: Payer: Self-pay | Admitting: *Deleted

## 2019-03-11 DIAGNOSIS — Z79899 Other long term (current) drug therapy: Secondary | ICD-10-CM

## 2019-03-11 DIAGNOSIS — G35 Multiple sclerosis: Secondary | ICD-10-CM

## 2019-03-11 NOTE — Telephone Encounter (Addendum)
Damya from [place] [facility] called back. Sent rx to [facility] and ready to ship medication. They spoke with [facility]. They have to speak with pt directly prior to shipping medications. Nurse tried twice to reach out to facility and has not heard back from the patient. Verified copay 0.00 for Aubagio. Advised I will try to reach out to facility/pt to give them a call. She should call [phone] and press option for nurse line.   Called and [place] at [facility] [phone]). Asked to talk with pt directly but she took message. She will have her call [facility] back and she is aware [facility] has also been reaching out to verify Aubagio rx.

## 2019-03-11 NOTE — Telephone Encounter (Signed)
Faxed lab orders to [facility], [facility], [facility] for pt to have monthly hepatic function test for 5 months after starting Aubagio. FaxWU:[id number]. Received fax confirmation.  Called and LVM for Tomma Lightning (rep for [facility]) to call back. Wanted to verify they will be sending shipment for Aubagio to [facility] for pt (their address was listed on start form that patient signed).

## 2019-03-17 NOTE — Telephone Encounter (Signed)
Called and spoke with Shannon Garrett with [facility]. They have tried calling three times and patient has not called back yet.   I called [place] at [phone] and spoke with [place] again. Asked that they help resolve this today with pt. She needs to call [facility] back at [phone] and press option for nurse line AND [facility] at [phone] to be able to [place]. They will have their social worker fax me a confirmation once this is completed. Provided them fax: [phone].

## 2019-03-24 NOTE — Telephone Encounter (Signed)
I called and spoke with Shannon Garrett again at [facility]. Advised we did not get update from Education officer, museum on whether the pt called [facility] back. She apologized and will f/u with social worker on this matter. I provided phone numbers to both again. Asked that social worker fax me update at [phone]. She verbalized understanding.

## 2019-03-30 NOTE — Telephone Encounter (Signed)
Called and spoke with Colletta [place] to let her know I have not received an update from the social worker on whether this was resolved. She placed me on hold. She is going to try and get director of nursing involved to get this issue resolved asap. I spoke with Darden Dates who was the [employer] of nursing. She confirmed pt started [place] today and received 3 boxes from [facility]. I again explained LFT's needed to be checked monthly for 5 months after starting. They stated PA aware and they have first lab draw set up already.

## 2019-04-06 ENCOUNTER — Telehealth: Payer: Self-pay | Admitting: *Deleted

## 2019-04-06 NOTE — Telephone Encounter (Signed)
Received fax from [facility], [place] phone: [phone] with labs results: Albumin 3.6, TBIL 2.4, Direct bilirubin 1.840, ALKPHOS 475, TOTAL PROTEIN 6.2, ALT 195, AST 106. Per Dr. Felecia Shelling, pt needs to stop Aubagio. They should recheck LFT's and HEPB sAG, aAB, cAB. If her LFT's are any higher, she needs to f/u with PCP. I called Joelene Millin and relayed above information. Gave [facility] phone number if she has further questions. Also faxed this update to [place] at [phone]. Received fax confirmation.

## 2019-05-10 ENCOUNTER — Inpatient Hospital Stay (HOSPITAL_COMMUNITY)
Admission: EM | Admit: 2019-05-10 | Discharge: 2019-05-18 | DRG: 871 | Disposition: A | Discharge status: Skilled Nursing Facility | Payer: Medicare Other | Source: Skilled Nursing Facility | Attending: Internal Medicine | Admitting: Internal Medicine

## 2019-05-10 ENCOUNTER — Encounter (HOSPITAL_COMMUNITY): Payer: Self-pay

## 2019-05-10 ENCOUNTER — Other Ambulatory Visit: Payer: Self-pay

## 2019-05-10 ENCOUNTER — Emergency Department (HOSPITAL_COMMUNITY): Payer: Medicare Other

## 2019-05-10 DIAGNOSIS — R627 Adult failure to thrive: Secondary | ICD-10-CM

## 2019-05-10 DIAGNOSIS — G9341 Metabolic encephalopathy: Secondary | ICD-10-CM | POA: Diagnosis present

## 2019-05-10 DIAGNOSIS — N390 Urinary tract infection, site not specified: Secondary | ICD-10-CM | POA: Diagnosis present

## 2019-05-10 DIAGNOSIS — E785 Hyperlipidemia, unspecified: Secondary | ICD-10-CM | POA: Diagnosis present

## 2019-05-10 DIAGNOSIS — Z8249 Family history of ischemic heart disease and other diseases of the circulatory system: Secondary | ICD-10-CM

## 2019-05-10 DIAGNOSIS — R7401 Elevation of levels of liver transaminase levels: Secondary | ICD-10-CM | POA: Diagnosis present

## 2019-05-10 DIAGNOSIS — A419 Sepsis, unspecified organism: Secondary | ICD-10-CM | POA: Diagnosis not present

## 2019-05-10 DIAGNOSIS — I69391 Dysphagia following cerebral infarction: Secondary | ICD-10-CM | POA: Diagnosis not present

## 2019-05-10 DIAGNOSIS — A4189 Other specified sepsis: Principal | ICD-10-CM | POA: Diagnosis present

## 2019-05-10 DIAGNOSIS — G35 Multiple sclerosis: Secondary | ICD-10-CM

## 2019-05-10 DIAGNOSIS — Z820 Family history of epilepsy and other diseases of the nervous system: Secondary | ICD-10-CM | POA: Diagnosis not present

## 2019-05-10 DIAGNOSIS — E2740 Unspecified adrenocortical insufficiency: Secondary | ICD-10-CM | POA: Diagnosis present

## 2019-05-10 DIAGNOSIS — Z20828 Contact with and (suspected) exposure to other viral communicable diseases: Secondary | ICD-10-CM

## 2019-05-10 DIAGNOSIS — E66812 Obesity, class 2: Secondary | ICD-10-CM

## 2019-05-10 DIAGNOSIS — G8220 Paraplegia, unspecified: Secondary | ICD-10-CM | POA: Diagnosis present

## 2019-05-10 DIAGNOSIS — Z515 Encounter for palliative care: Secondary | ICD-10-CM | POA: Diagnosis present

## 2019-05-10 DIAGNOSIS — J9601 Acute respiratory failure with hypoxia: Secondary | ICD-10-CM | POA: Diagnosis present

## 2019-05-10 DIAGNOSIS — Z8616 Personal history of COVID-19: Secondary | ICD-10-CM | POA: Diagnosis present

## 2019-05-10 DIAGNOSIS — K219 Gastro-esophageal reflux disease without esophagitis: Secondary | ICD-10-CM | POA: Diagnosis present

## 2019-05-10 DIAGNOSIS — H409 Unspecified glaucoma: Secondary | ICD-10-CM | POA: Diagnosis present

## 2019-05-10 DIAGNOSIS — E6609 Other obesity due to excess calories: Secondary | ICD-10-CM

## 2019-05-10 DIAGNOSIS — I9589 Other hypotension: Secondary | ICD-10-CM | POA: Diagnosis not present

## 2019-05-10 DIAGNOSIS — I959 Hypotension, unspecified: Secondary | ICD-10-CM

## 2019-05-10 DIAGNOSIS — Z882 Allergy status to sulfonamides status: Secondary | ICD-10-CM

## 2019-05-10 DIAGNOSIS — N170 Acute kidney failure with tubular necrosis: Secondary | ICD-10-CM | POA: Diagnosis present

## 2019-05-10 DIAGNOSIS — N179 Acute kidney failure, unspecified: Secondary | ICD-10-CM

## 2019-05-10 DIAGNOSIS — J1289 Other viral pneumonia: Secondary | ICD-10-CM | POA: Diagnosis present

## 2019-05-10 DIAGNOSIS — R6520 Severe sepsis without septic shock: Secondary | ICD-10-CM | POA: Diagnosis present

## 2019-05-10 DIAGNOSIS — Z803 Family history of malignant neoplasm of breast: Secondary | ICD-10-CM

## 2019-05-10 DIAGNOSIS — Z6841 Body Mass Index (BMI) 40.0 and over, adult: Secondary | ICD-10-CM | POA: Diagnosis not present

## 2019-05-10 DIAGNOSIS — E669 Obesity, unspecified: Secondary | ICD-10-CM | POA: Diagnosis present

## 2019-05-10 DIAGNOSIS — R1310 Dysphagia, unspecified: Secondary | ICD-10-CM | POA: Diagnosis present

## 2019-05-10 DIAGNOSIS — Z1612 Extended spectrum beta lactamase (ESBL) resistance: Secondary | ICD-10-CM | POA: Diagnosis present

## 2019-05-10 DIAGNOSIS — I5022 Chronic systolic (congestive) heart failure: Secondary | ICD-10-CM | POA: Diagnosis present

## 2019-05-10 DIAGNOSIS — E1165 Type 2 diabetes mellitus with hyperglycemia: Secondary | ICD-10-CM | POA: Diagnosis present

## 2019-05-10 DIAGNOSIS — Z8042 Family history of malignant neoplasm of prostate: Secondary | ICD-10-CM

## 2019-05-10 DIAGNOSIS — Z880 Allergy status to penicillin: Secondary | ICD-10-CM

## 2019-05-10 DIAGNOSIS — I110 Hypertensive heart disease with heart failure: Secondary | ICD-10-CM | POA: Diagnosis present

## 2019-05-10 DIAGNOSIS — I5042 Chronic combined systolic (congestive) and diastolic (congestive) heart failure: Secondary | ICD-10-CM | POA: Diagnosis present

## 2019-05-10 DIAGNOSIS — U071 COVID-19: Secondary | ICD-10-CM | POA: Diagnosis present

## 2019-05-10 DIAGNOSIS — I420 Dilated cardiomyopathy: Secondary | ICD-10-CM | POA: Diagnosis present

## 2019-05-10 DIAGNOSIS — Z79899 Other long term (current) drug therapy: Secondary | ICD-10-CM

## 2019-05-10 DIAGNOSIS — G894 Chronic pain syndrome: Secondary | ICD-10-CM | POA: Diagnosis present

## 2019-05-10 DIAGNOSIS — G4700 Insomnia, unspecified: Secondary | ICD-10-CM | POA: Diagnosis present

## 2019-05-10 DIAGNOSIS — Z87891 Personal history of nicotine dependence: Secondary | ICD-10-CM

## 2019-05-10 DIAGNOSIS — B961 Klebsiella pneumoniae [K. pneumoniae] as the cause of diseases classified elsewhere: Secondary | ICD-10-CM | POA: Diagnosis present

## 2019-05-10 DIAGNOSIS — Z66 Do not resuscitate: Secondary | ICD-10-CM

## 2019-05-10 DIAGNOSIS — Z993 Dependence on wheelchair: Secondary | ICD-10-CM

## 2019-05-10 DIAGNOSIS — Z7401 Bed confinement status: Secondary | ICD-10-CM

## 2019-05-10 HISTORY — DX: COVID-19: U07.1

## 2019-05-10 LAB — LACTIC ACID, PLASMA
Lactic Acid, Venous: 1.8 mmol/L (ref 0.5–1.9)
Lactic Acid, Venous: 1.2 mmol/L (ref 0.5–1.9)

## 2019-05-10 LAB — CBC
HCT: 30 % — ABNORMAL LOW (ref 36.0–46.0)
Hemoglobin: 9.9 g/dL — ABNORMAL LOW (ref 12.0–15.0)
MCH: 28.9 pg (ref 26.0–34.0)
MCHC: 33 g/dL (ref 30.0–36.0)
MCV: 87.5 fL (ref 80.0–100.0)
Platelets: 128 10*3/uL — ABNORMAL LOW (ref 150–400)
RBC: 3.43 MIL/uL — ABNORMAL LOW (ref 3.87–5.11)
RDW: 15.2 % (ref 11.5–15.5)
WBC: 10.7 10*3/uL — ABNORMAL HIGH (ref 4.0–10.5)
nRBC: 0 % (ref 0.0–0.2)

## 2019-05-10 LAB — CREATININE, SERUM
Creatinine, Ser: 1.95 mg/dL — ABNORMAL HIGH (ref 0.44–1.00)
GFR calc Af Amer: 33 mL/min — ABNORMAL LOW (ref >60)
GFR calc non Af Amer: 28 mL/min — ABNORMAL LOW (ref >60)

## 2019-05-10 LAB — CBC WITH DIFFERENTIAL/PLATELET
Abs Immature Granulocytes: 0.05 10*3/uL (ref 0.00–0.07)
Basophils Absolute: 0 10*3/uL (ref 0.0–0.1)
Basophils Relative: 0 %
Eosinophils Absolute: 0 10*3/uL (ref 0.0–0.5)
Eosinophils Relative: 0 %
HCT: 35.1 % — ABNORMAL LOW (ref 36.0–46.0)
Hemoglobin: 11.6 g/dL — ABNORMAL LOW (ref 12.0–15.0)
Immature Granulocytes: 1 %
Lymphocytes Relative: 6 %
Lymphs Abs: 0.5 10*3/uL — ABNORMAL LOW (ref 0.7–4.0)
MCH: 28.5 pg (ref 26.0–34.0)
MCHC: 33 g/dL (ref 30.0–36.0)
MCV: 86.2 fL (ref 80.0–100.0)
Monocytes Absolute: 0.5 10*3/uL (ref 0.1–1.0)
Monocytes Relative: 6 %
Neutro Abs: 6.8 10*3/uL (ref 1.7–7.7)
Neutrophils Relative %: 87 %
Platelets: 145 10*3/uL — ABNORMAL LOW (ref 150–400)
RBC: 4.07 MIL/uL (ref 3.87–5.11)
RDW: 15.1 % (ref 11.5–15.5)
WBC: 7.9 10*3/uL (ref 4.0–10.5)
nRBC: 0 % (ref 0.0–0.2)

## 2019-05-10 LAB — PROTIME-INR
INR: 1.1 (ref 0.8–1.2)
INR: 1.1 (ref 0.8–1.2)
Prothrombin Time: 13.7 seconds (ref 11.4–15.2)
Prothrombin Time: 13.9 seconds (ref 11.4–15.2)

## 2019-05-10 LAB — COMPREHENSIVE METABOLIC PANEL
ALT: 325 U/L — ABNORMAL HIGH (ref 0–44)
AST: 791 U/L — ABNORMAL HIGH (ref 15–41)
Albumin: 3.2 g/dL — ABNORMAL LOW (ref 3.5–5.0)
Alkaline Phosphatase: 462 U/L — ABNORMAL HIGH (ref 38–126)
Anion gap: 13 (ref 5–15)
BUN: 25 mg/dL — ABNORMAL HIGH (ref 6–20)
CO2: 23 mmol/L (ref 22–32)
Calcium: 7.6 mg/dL — ABNORMAL LOW (ref 8.9–10.3)
Chloride: 95 mmol/L — ABNORMAL LOW (ref 98–111)
Creatinine, Ser: 2.54 mg/dL — ABNORMAL HIGH (ref 0.44–1.00)
GFR calc Af Amer: 24 mL/min — ABNORMAL LOW (ref >60)
GFR calc non Af Amer: 20 mL/min — ABNORMAL LOW (ref >60)
Glucose, Bld: 342 mg/dL — ABNORMAL HIGH (ref 70–99)
Potassium: 4.2 mmol/L (ref 3.5–5.1)
Sodium: 131 mmol/L — ABNORMAL LOW (ref 135–145)
Total Bilirubin: 2.7 mg/dL — ABNORMAL HIGH (ref 0.3–1.2)
Total Protein: 6.7 g/dL (ref 6.5–8.1)

## 2019-05-10 LAB — TSH: TSH: 2.526 u[IU]/mL (ref 0.350–4.500)

## 2019-05-10 LAB — PROCALCITONIN: Procalcitonin: 104.13 ng/mL

## 2019-05-10 LAB — D-DIMER, QUANTITATIVE: D-Dimer, Quant: 1.92 ug/mL-FEU — ABNORMAL HIGH (ref 0.00–0.50)

## 2019-05-10 LAB — TRIGLYCERIDES: Triglycerides: 273 mg/dL — ABNORMAL HIGH (ref <150)

## 2019-05-10 LAB — FIBRINOGEN: Fibrinogen: 554 mg/dL — ABNORMAL HIGH (ref 210–475)

## 2019-05-10 LAB — LACTATE DEHYDROGENASE: LDH: 1183 U/L — ABNORMAL HIGH (ref 98–192)

## 2019-05-10 LAB — APTT: aPTT: 38 seconds — ABNORMAL HIGH (ref 24–36)

## 2019-05-10 LAB — CBG MONITORING, ED: Glucose-Capillary: 213 mg/dL — ABNORMAL HIGH (ref 70–99)

## 2019-05-10 MED ORDER — PANTOPRAZOLE SODIUM 40 MG PO TBEC
40.0000 mg | DELAYED_RELEASE_TABLET | Freq: Every day | ORAL | Status: DC
  Administered 2019-05-10 – 2019-05-18 (×9): 40 mg via ORAL
  Filled 2019-05-10 (×9): qty 1

## 2019-05-10 MED ORDER — VANCOMYCIN HCL 10 G IV SOLR: 1250.0000 mg | INTRAVENOUS | Status: DC

## 2019-05-10 MED ORDER — ACETAMINOPHEN 325 MG PO TABS
650.0000 mg | ORAL_TABLET | Freq: Once | ORAL | Status: AC
  Administered 2019-05-10: 650 mg via ORAL

## 2019-05-10 MED ORDER — VANCOMYCIN HCL 10 G IV SOLR
2000.0000 mg | Freq: Once | INTRAVENOUS | Status: AC
  Administered 2019-05-10: 16:00:00 2000 mg via INTRAVENOUS
  Filled 2019-05-10: qty 2000

## 2019-05-10 MED ORDER — SODIUM CHLORIDE 0.9 % IV SOLN
INTRAVENOUS | Status: DC
  Administered 2019-05-10: via INTRAVENOUS

## 2019-05-10 MED ORDER — ACETAMINOPHEN 325 MG PO TABS
650.0000 mg | ORAL_TABLET | Freq: Four times a day (QID) | ORAL | Status: DC | PRN
  Administered 2019-05-14 – 2019-05-15 (×2): 650 mg via ORAL
  Filled 2019-05-10: qty 2

## 2019-05-10 MED ORDER — VANCOMYCIN HCL IN DEXTROSE 1-5 GM/200ML-% IV SOLN: 1000.0000 mg | INTRAVENOUS | Status: DC

## 2019-05-10 MED ORDER — ACETAMINOPHEN 650 MG RE SUPP: 650.0000 mg | Freq: Four times a day (QID) | RECTAL | Status: DC | PRN

## 2019-05-10 MED ORDER — METRONIDAZOLE IN NACL 5-0.79 MG/ML-% IV SOLN
500.0000 mg | Freq: Once | INTRAVENOUS | Status: AC
  Administered 2019-05-10: 500 mg via INTRAVENOUS
  Filled 2019-05-10: qty 100

## 2019-05-10 MED ORDER — MELATONIN 5 MG PO TABS
2.0000 | ORAL_TABLET | Freq: Every day | ORAL | Status: DC
  Administered 2019-05-10 – 2019-05-17 (×8): 10 mg via ORAL
  Filled 2019-05-10 (×9): qty 2

## 2019-05-10 MED ORDER — SODIUM CHLORIDE 0.9 % IV SOLN
2.0000 g | Freq: Once | INTRAVENOUS | Status: DC
  Filled 2019-05-10: qty 2

## 2019-05-10 MED ORDER — SODIUM CHLORIDE 0.9 % IV BOLUS
30.0000 mL/kg | Freq: Once | INTRAVENOUS | Status: AC
  Administered 2019-05-10: 3600 mL via INTRAVENOUS

## 2019-05-10 MED ORDER — HYDROCODONE-ACETAMINOPHEN 5-325 MG PO TABS
1.0000 | ORAL_TABLET | ORAL | Status: DC | PRN
  Administered 2019-05-11: 09:00:00 1 via ORAL
  Filled 2019-05-10: qty 1

## 2019-05-10 MED ORDER — SODIUM CHLORIDE 0.9 % IV SOLN
2.0000 g | Freq: Two times a day (BID) | INTRAVENOUS | Status: DC
  Administered 2019-05-11 – 2019-05-12 (×3): 2 g via INTRAVENOUS
  Filled 2019-05-10 (×3): qty 2

## 2019-05-10 MED ORDER — MIRABEGRON ER 50 MG PO TB24
50.0000 mg | ORAL_TABLET | Freq: Every day | ORAL | Status: DC
  Administered 2019-05-11 – 2019-05-17 (×7): 50 mg via ORAL
  Filled 2019-05-10 (×8): qty 1

## 2019-05-10 MED ORDER — INSULIN ASPART 100 UNIT/ML ~~LOC~~ SOLN
0.0000 [IU] | Freq: Three times a day (TID) | SUBCUTANEOUS | Status: DC
  Administered 2019-05-11: 8 [IU] via SUBCUTANEOUS
  Administered 2019-05-11: 2 [IU] via SUBCUTANEOUS
  Administered 2019-05-11: 5 [IU] via SUBCUTANEOUS
  Administered 2019-05-12 – 2019-05-14 (×7): 8 [IU] via SUBCUTANEOUS
  Administered 2019-05-14 (×2): 3 [IU] via SUBCUTANEOUS
  Administered 2019-05-15: 13:00:00 5 [IU] via SUBCUTANEOUS
  Administered 2019-05-15: 18:00:00 11 [IU] via SUBCUTANEOUS
  Administered 2019-05-15: 09:00:00 2 [IU] via SUBCUTANEOUS
  Administered 2019-05-16: 09:00:00 8 [IU] via SUBCUTANEOUS
  Administered 2019-05-16: 12:00:00 5 [IU] via SUBCUTANEOUS
  Filled 2019-05-10: qty 0.15

## 2019-05-10 MED ORDER — DEXAMETHASONE SODIUM PHOSPHATE 10 MG/ML IJ SOLN
6.0000 mg | Freq: Every day | INTRAMUSCULAR | Status: DC
  Administered 2019-05-10: 6 mg via INTRAVENOUS
  Filled 2019-05-10: qty 1

## 2019-05-10 MED ORDER — SODIUM CHLORIDE 0.9 % IV SOLN
2.0000 g | Freq: Once | INTRAVENOUS | Status: AC
  Administered 2019-05-10: 15:00:00 2 g via INTRAVENOUS
  Filled 2019-05-10: qty 2

## 2019-05-10 MED ORDER — AMANTADINE HCL 100 MG PO CAPS
100.0000 mg | ORAL_CAPSULE | Freq: Every day | ORAL | Status: DC
  Administered 2019-05-10 – 2019-05-18 (×9): 100 mg via ORAL
  Filled 2019-05-10 (×10): qty 1

## 2019-05-10 MED ORDER — VANCOMYCIN HCL IN DEXTROSE 1-5 GM/200ML-% IV SOLN: 1000.0000 mg | Freq: Once | INTRAVENOUS | Status: DC

## 2019-05-10 MED ORDER — ACETAMINOPHEN 325 MG PO TABS
650.0000 mg | ORAL_TABLET | Freq: Once | ORAL | Status: DC | PRN
  Filled 2019-05-10 (×2): qty 2

## 2019-05-10 MED ORDER — ONDANSETRON HCL 4 MG PO TABS: 4.0000 mg | ORAL_TABLET | Freq: Four times a day (QID) | ORAL | Status: DC | PRN

## 2019-05-10 MED ORDER — VITAMIN C 500 MG PO TABS
1000.0000 mg | ORAL_TABLET | Freq: Two times a day (BID) | ORAL | Status: DC
  Administered 2019-05-10 – 2019-05-11 (×2): 1000 mg via ORAL
  Filled 2019-05-10 (×3): qty 2

## 2019-05-10 MED ORDER — ZINC SULFATE 220 (50 ZN) MG PO CAPS
220.0000 mg | ORAL_CAPSULE | Freq: Every day | ORAL | Status: DC
  Administered 2019-05-10 – 2019-05-18 (×9): 220 mg via ORAL
  Filled 2019-05-10 (×10): qty 1

## 2019-05-10 MED ORDER — HEPARIN SODIUM (PORCINE) 5000 UNIT/ML IJ SOLN
5000.0000 [IU] | Freq: Three times a day (TID) | INTRAMUSCULAR | Status: DC
  Administered 2019-05-10 – 2019-05-15 (×14): 5000 [IU] via SUBCUTANEOUS
  Filled 2019-05-10 (×15): qty 1

## 2019-05-10 MED ORDER — ONDANSETRON HCL 4 MG/2ML IJ SOLN: 4.0000 mg | Freq: Four times a day (QID) | INTRAMUSCULAR | Status: DC | PRN

## 2019-05-10 NOTE — Progress Notes (Signed)
A consult was received from an ED provider for vancomycin + aztreonam (with investigation into allergy history and change to cefepime if has tolerated cephalosporins previously)  per pharmacy dosing.  The patient's profile has been reviewed for ht/wt/allergies/indication/available labs.    Allergies  Allergen Reactions  . Sulfonamide Derivatives Hives and Shortness Of Breath  . Penicillins Hives and Itching    Has patient had a PCN reaction causing immediate rash, facial/tongue/throat swelling, SOB or lightheadedness with hypotension: unknown Has patient had a PCN reaction causing severe rash involving mucus membranes or skin necrosis: unknown Has patient had a PCN reaction that required hospitalization: unknown Has patient had a PCN reaction occurring within the last 10 years: unknown If all of the above answers are "NO", then may proceed with Cephalosporin use. Prior course of rocephin charted 05/2015    Patient has tolerated numerous doses of cephalosporins in the past. Will change aztreonam to cefepime per protocol.  A one time order has been placed for vancomycin 2000 mg IV once and cefepime 2 g IV once.    Further antibiotics/pharmacy consults should be ordered by admitting physician if indicated.                       Thank you, Lenis Noon, PharmD 05/10/2019  3:14 PM

## 2019-05-10 NOTE — Progress Notes (Addendum)
Pharmacy Antibiotic Note  Shannon Garrett is a 58 y.o. female admitted on 05/10/2019 with sepsis.  Pharmacy has been consulted for Vancomycin and Cefepime dosing. Noted penicillin allergy, but patient has tolerated numerous doses of cephalosporins in the past.   Plan: Vancomycin 2g IV x 1 given in the ED. Continue with Vancomycin 1250mg  IV q36h to start 05/11/2019 at 2000. Vancomycin levels at steady state, as indicated.  Cefepime 2g IV q12h. Monitor renal function, cultures, clinical course.   Height: 5\' 5"  (165.1 cm) Weight: 264 lb 8.8 oz (120 kg) IBW/kg (Calculated) : 57  Temp (24hrs), Avg:100.3 F (37.9 C), Min:100.3 F (37.9 C), Max:100.3 F (37.9 C)  Recent Labs  Lab 05/10/19 1452 05/10/19 1615 05/10/19 1705 05/10/19 1740 05/10/19 1956  WBC  --   --  7.9  --  10.7*  CREATININE  --  2.54*  --   --  1.95*  LATICACIDVEN 1.8  --   --  1.2  --     Estimated Creatinine Clearance: 41.8 mL/min (A) (by C-G formula based on SCr of 1.95 mg/dL (H)).    Allergies  Allergen Reactions  . Sulfonamide Derivatives Hives and Shortness Of Breath  . Penicillins Hives and Itching    Has patient had a PCN reaction causing immediate rash, facial/tongue/throat swelling, SOB or lightheadedness with hypotension: unknown Has patient had a PCN reaction causing severe rash involving mucus membranes or skin necrosis: unknown Has patient had a PCN reaction that required hospitalization: unknown Has patient had a PCN reaction occurring within the last 10 years: unknown If all of the above answers are "NO", then may proceed with Cephalosporin use. Prior course of rocephin charted 05/2015    Antimicrobials this admission: 11/8 Metronidazole x 1 11/8 Vancomycin >> 11/8 Cefepime >>  Dose adjustments this admission: --  Microbiology results: 11/8 BCx: sent 11/8 UCx: ordered  11/8 COVID: sent 11/8 HIV antibody: sent   Thank you for allowing pharmacy to be a part of this patient's  care.   Lindell Spar, PharmD, BCPS Clinical Pharmacist  05/10/2019 9:13 PM

## 2019-05-10 NOTE — ED Notes (Signed)
Admitting MD at bedside.

## 2019-05-10 NOTE — ED Triage Notes (Signed)
Patient arrived from [place]. Patient is AOx4 and non-ambulatory per [facility]. Patient has DNR and MOST Form at nurse station. Patient facility called due to patient being more lethargic than normal. Patient is on room air. Patients NP called and wanted to ensure and clarify DNR and MOST form guidelines for patient. Patient does not appear to be in any type of visual distress.

## 2019-05-10 NOTE — H&P (Addendum)
[facility] History and Physical  Shannon Garrett [id number] DOB: February 17, 1961 DOA: 05/10/2019  Referring physician: Dr. Shirlyn Goltz PCP: Gildardo Cranker, DO  Chief Complaint: Hypotension and altered mental status  HPI: Shannon Garrett is a 58 y.o. female with past medical history of advanced multiple sclerosis, she is bedbound, HTN, cardiomyopathy and neurogenic bladder with recurrent UTIs.  She lives in SNF, she was sent to the hospital because of lethargy and hypotension.  When I interviewed the patient she is awake and alert and oriented x3.  She is somewhat slow in responding and some of the history obtained from [facility] records.  Apparently there is an outbreak of COVID-19 in her nursing home, she tested positive a week ago, reportedly.  But today she was lethargic, blood pressure was low so she was sent to the hospital for further evaluation.  In the ED initial evaluation shows Vitals: She is febrile temperature 100.3, and blood pressure 78/59. Exam: Initially she was lethargic but mental status improved after initiation of IVF Labs: Creatinine is 3.5, AST and ALT 791 and 325 respectively. Imaging: CXR showed hazy airspace opacity concern for infection versus asymmetric edema. Interventions: Received IV fluids, started on cefepime in the ED.   Review of Systems:  12 point review of system is negative except for symptoms mentioned in the HPI.  Past Medical History:  Diagnosis Date  . Cardiomyopathy ([place])    a.  Echo 04/29/12: Mild LVH, EF 20-25%, mild AI, moderate MR, moderate LAE, mild RAE, mild RVE, moderate TR, PASP 51, small pericardial effusion;   b. probably non-ischemic given multiple chemo-Tx agents used for MS and global LV dysfn on echo  . Chronic systolic heart failure ([place])   . COVID-19 05/10/2019   Per facility paperwork  . Depression   . Glaucoma   . Hypertension   . MS (multiple sclerosis) ([place])    a. Dx'd late 20's. b. Tx with Novantrone, Tysabri, Copaxone  previously.   Past Surgical History:  Procedure Laterality Date  . ABLATION     uterine  . CESAREAN SECTION     Social History:   reports that she has quit smoking. She has a 2.50 pack-year smoking history. She has never used smokeless tobacco. She reports that she does not drink alcohol or use drugs.  Allergies  Allergen Reactions  . Sulfonamide Derivatives Hives and Shortness Of Breath  . Penicillins Hives and Itching    Has patient had a PCN reaction causing immediate rash, facial/tongue/throat swelling, SOB or lightheadedness with hypotension: unknown Has patient had a PCN reaction causing severe rash involving mucus membranes or skin necrosis: unknown Has patient had a PCN reaction that required hospitalization: unknown Has patient had a PCN reaction occurring within the last 10 years: unknown If all of the above answers are "NO", then may proceed with Cephalosporin use. Prior course of rocephin charted 05/2015    Family History  Problem Relation Age of Onset  . Hypertension Mother   . Cancer Mother        breast   . Cancer Father        prostate  . Multiple sclerosis Sister   . Heart attack Neg Hx      Prior to Admission medications   Medication Sig Start Date End Date Taking? Authorizing Provider  acetaminophen (TYLENOL) 325 MG tablet Take 650 mg by mouth 3 (three) times daily.    [provider]  albuterol (PROVENTIL) (2.5 MG/3ML) 0.083% nebulizer solution Take 3 mLs (2.5 mg total)  by nebulization every 2 (two) hours as needed for wheezing. 11/10/15   Regalado, Belkys A, MD  amantadine (SYMMETREL) 100 MG capsule Take 100 mg by mouth daily.     [provider]  atorvastatin (LIPITOR) 10 MG tablet Take 10 mg by mouth at bedtime.    [provider]  Baclofen 5 MG TABS Take 1 tablet by mouth 4 (four) times daily. Hold if lethargic or confused  12/04/17   [provider]  benzocaine (HURRICAINE) 20 % GEL Use as directed 1 application in the  mouth or throat 2 (two) times daily. 02/19/18   [provider]  buPROPion (WELLBUTRIN) 100 MG tablet Take 100 mg by mouth daily. 04/18/18   [provider]  carvedilol (COREG) 25 MG tablet Take 1 tablet (25 mg total) by mouth 2 (two) times daily with a meal. 07/25/15   Rai, Ripudeep K, MD  Cholecalciferol (VITAMIN D-3 PO) Take 1,000 Int'l Units/day by mouth at bedtime.     [provider]  CRANBERRY PO Take 100 mg by mouth at bedtime.     [provider]  DULOXETINE HCL PO Take 90 mg by mouth daily.  03/07/18   [provider]  famotidine (PEPCID) 20 MG tablet Take 1 tablet (20 mg total) by mouth 2 (two) times daily. 07/25/15   Rai, Vernelle Emerald, MD  ferrous sulfate 325 (65 FE) MG tablet Take 325 mg by mouth daily with breakfast. 02/20/18   [provider]  fluticasone (FLONASE) 50 MCG/ACT nasal spray Place 2 sprays into both nostrils every evening.    [provider]  GABAPENTIN PO Give 1000 mg by mouth two times daily. 06/19/17   [provider]  hydrocortisone cream 0.5 % Apply to cheeks topically two times a day for dry areas, avoid eyes    [provider]  ipratropium-albuterol (DUONEB) 0.5-2.5 (3) MG/3ML SOLN Inhale 3 mLs into the lungs every 6 (six) hours. 09/03/17   [provider]  Lactase (LACTAID PO) Take 1 tablet by mouth at bedtime.     [provider]  latanoprost (XALATAN) 0.005 % ophthalmic solution Place 1 drop into both eyes at bedtime.    [provider]  linaclotide Rolan Lipa) 290 MCG CAPS capsule Take 290 mcg by mouth daily before breakfast.  11/20/17   [provider]  loratadine (CLARITIN) 10 MG tablet Take 10 mg by mouth daily.    [provider]  Melatonin 5 MG TABS Take 2 tablets by mouth at bedtime.  11/14/17   [provider]  Menthol, Topical Analgesic, (BENGAY EX) Apply topically. Apply to shoulders every 12 hours as needed for pain    [provider]  Menthol-Methyl Salicylate (MUSCLE RUB) 10-15 % CREA Apply to shoulders topically two times a day for pain    [provider]  mirabegron ER (MYRBETRIQ) 50 MG TB24 tablet Take 50 mg by mouth at bedtime.     [provider]  omeprazole (PRILOSEC) 20 MG capsule Take 20 mg by mouth daily.    [provider]  ondansetron (ZOFRAN) 4 MG tablet Take 4 mg by mouth every 6 (six) hours as needed for nausea or vomiting. 11/01/17   [provider]  pantoprazole (PROTONIX) 40 MG tablet Take 40 mg by mouth daily. 02/20/18 05/22/18  [provider]  phenazopyridine (PYRIDIUM) 100 MG tablet Take 100 mg by mouth 2 (two) times daily.  01/12/18   [provider]  potassium chloride (KLOR-CON) 20 MEQ  packet Take 20 mEq by mouth daily.     [provider]  Propylene Glycol (SYSTANE BALANCE) 0.6 % SOLN Instill 1 drop in both eyes two times a day for dry eyes    [provider]  senna-docusate (SENOKOT S) 8.6-50 MG tablet Take 2 tablets by mouth 2 (two) times daily.     [provider]  spironolactone (ALDACTONE) 25 MG tablet Take 25 mg by mouth daily. For CHF    [provider]  tamsulosin (FLOMAX) 0.4 MG CAPS capsule Take 0.4 mg by mouth daily. For incontinence    [provider]  Teriflunomide (AUBAGIO) 14 MG TABS Take by mouth.    [provider]  torsemide (DEMADEX) 20 MG tablet Take 20 mg by mouth daily.    [provider]  traMADol (ULTRAM) 50 MG tablet Take 50 mg by mouth every 8 (eight) hours as needed.    [provider]   Physical Exam: Vitals:   05/10/19 1830 05/10/19 1900  BP: 93/60 (!) 99/52  Pulse: (!) 108 (!) 104  Resp: (!) 23 19  Temp:    SpO2: 95% 97%   Constitutional: Oriented to person, place, and time. Well-developed and well-nourished. Cooperative.  Head: Normocephalic and atraumatic.  Nose: Nose normal.  Mouth/Throat: Uvula is midline, oropharynx is  clear and moist and mucous membranes are normal.  Eyes: Conjunctivae and EOM are normal. Pupils are equal, round, and reactive to light.  Neck: Trachea normal and normal range of motion. Neck supple.  Cardiovascular: Normal rate, regular rhythm, S1 normal, S2 normal, normal heart sounds and intact distal pulses.   Pulmonary/Chest: Effort normal and breath sounds normal.  Abdominal: Soft. Bowel sounds are normal. There is no hepatosplenomegaly. There is no tenderness.  Musculoskeletal: Legs shows some wasting consistent with bedbound status Neurological: Alert and oriented to person, place, and time. Has normal strength. No cranial nerve deficit or sensory deficit.  Skin: Skin is warm, dry and intact.  Psychiatric: Has a normal mood and affect. Speech is normal and behavior is normal.   Labs on Admission:  Basic Metabolic Panel: Recent Labs  Lab 05/10/19 1615  NA 131*  K 4.2  CL 95*  CO2 23  GLUCOSE 342*  BUN 25*  CREATININE 2.54*  CALCIUM 7.6*   Liver Function Tests: Recent Labs  Lab 05/10/19 1615  AST 791*  ALT 325*  ALKPHOS 462*  BILITOT 2.7*  PROT 6.7  ALBUMIN 3.2*   No results for input(s): LIPASE, AMYLASE in the last 168 hours. No results for input(s): AMMONIA in the last 168 hours. CBC: Recent Labs  Lab 05/10/19 1705  WBC 7.9  NEUTROABS 6.8  HGB 11.6*  HCT 35.1*  MCV 86.2  PLT 145*   Cardiac Enzymes: No results for input(s): CKTOTAL, CKMB, CKMBINDEX, TROPONINI in the last 168 hours.  BNP (last 3 results) No results for input(s): BNP in the last 8760 hours.  ProBNP (last 3 results) No results for input(s): PROBNP in the last 8760 hours.  CBG: No results for input(s): GLUCAP in the last 168 hours.  Radiological Exams on Admission: Dg Chest Port 1 View  Result Date: 05/10/2019 CLINICAL DATA:  Patient arrived from [place]. Patient facility called due to patient being more lethargic than normal. H/o Covid-19. Cardiomyopathy, Chronic Systolic  Heart Failure, HTN, MS. Former smoker. EXAM: PORTABLE CHEST 1 VIEW COMPARISON:  Chest radiograph 11/07/2015 FINDINGS: Stable cardiomediastinal contours. There are subtle airspace opacities throughout the right lung. Small linear opacities at  the left base likely reflect atelectasis. No pneumothorax or large pleural effusion. No acute finding in the visualized skeleton. IMPRESSION: Asymmetric hazy airspace opacities throughout the right lung raise concern for infection versus asymmetric edema. Minimal linear opacities at the left base likely atelectasis. Electronically Signed   By: Audie Pinto M.D.   On: 05/10/2019 16:15    EKG: Independently reviewed.   Assessment/Plan Active Problems:   Sepsis ([place])   Severe sepsis POA, heart rate, RR and low blood pressure in the presence of infection. Started on IV fluids, initial blood pressure was 78/59. Started on IV antibiotics cefepime and vancomycin. Follow cultures, follow urine and blood cultures, she has history of recurrent UTIs.  Recent diagnosis COVID-19 For now she is COVID-19 PUI, per nursing staff the SNF failed to fax report. Check SARS-CoV-2, if it is positive probably can consider transfer to [facility].  Multifocal pneumonia CXR showed hazy opacity can be infectious versus asymmetric edema. Recent diagnosis of COVID-19, this is considered multifocal pneumonia. Started on dexamethasone, zinc and vitamin C. If she develops hypoxia consider remdesivir and convalescent plasma transfusion.  History of dilated cardiomyopathy The most recent 2D echo showed normal LVEF 55 to 60% Discontinue fluids in the morning if blood pressure improves. Although is not recommended for patients for COVID-19 receive IV fluids but she had low blood pressure.  Multiple sclerosis Restart home medications  Acute metabolic encephalopathy She is altered when she came in, still slow in responding but she is awake and alert. This is likely secondary to  sepsis and hypotension.  AKI Baseline creatinine is 1.1 from February 2020, presented with creatinine of 2.5. Hold nephrotoxic medications, start on IV fluids. This is likely vasomotor nephropathy secondary to sepsis.  Transaminitis AST 791 and ALT 325, this could be secondary to hypotension (shocked liver) If not improving with IV fluids, consider RUQ ultrasound.  Diabetes mellitus type 2, Uncontrolled with hyperglycemia Glucose 342 on admission no evidence of ketosis or acidosis. Placed on SSI, likely going to see elevated blood sugar as she is on steroids.   Code Status: DO NOT RESUSCITATE Family Communication: No family at bedside Disposition Plan: Likely she will go back to her SNF when she recovers.  Time spent: 70 minutes  Birdie Hopes, MD [facility] Pager [phone]

## 2019-05-10 NOTE — ED Provider Notes (Signed)
[facility] DEPT Provider Note   CSN: [id number] Arrival date & time: 05/10/19  1329     History   Chief Complaint Chief Complaint  Patient presents with  . COVID +    HPI Shannon Garrett is a 58 y.o. female with a past medical history of MS, hypertension, cardiomyopathy, CHF, paraplegia, diagnosed last week with COVID-19, who presents today for evaluation of altered mental status.  History obtained from chart review, facility staff, and patient's sister.  Facility reports that patient has been diagnosed with coronavirus and that today Shannon was unresponsive however was still able to request transport to the hospital.  Shannon comes with a MOLST form that states Shannon wants aggressive treatment without CPR or intubation.  Shannon wants a trial of IV fluids with no feeding tube, and that if Shannon does not respond to aggressive interventions then to transition to comfort care.  Shannon also has a signed DNR with the most.  Patient's sister reports that Shannon was told patient was unresponsive earlier.  Patient sister states that [facility] to [facility].    According to staff at the facility patient was febrile this morning.       HPI  Past Medical History:  Diagnosis Date  . Cardiomyopathy ([place])    a.  Echo 04/29/12: Mild LVH, EF 20-25%, mild AI, moderate MR, moderate LAE, mild RAE, mild RVE, moderate TR, PASP 51, small pericardial effusion;   b. probably non-ischemic given multiple chemo-Tx agents used for MS and global LV dysfn on echo  . Chronic systolic heart failure ([place])   . COVID-19 05/10/2019   Per facility paperwork  . Depression   . Glaucoma   . Hypertension   . MS (multiple sclerosis) ([place])    a. Dx'd late 20's. b. Tx with Novantrone, Tysabri, Copaxone previously.    Patient Active Problem List   Diagnosis Date Noted  . Dysesthesia 08/26/2018  . Dyslipidemia 06/13/2017  . Elevated alkaline phosphatase level 06/13/2017   . Acute frontal sinusitis 05/24/2017  . Influenza 04/19/2017  . Glaucoma 04/06/2017  . Allergic rhinitis due to allergen 03/07/2017  . Vaginal candidiasis 03/01/2017  . UTI (urinary tract infection) 01/02/2017  . High risk medication use 01/25/2016  . Neuropathy 11/13/2015  . GERD (gastroesophageal reflux disease) 11/13/2015  . Dysphagia as late effect of stroke 11/08/2015  . Adrenal insufficiency (Addison's disease) ([place]) 08/12/2015  . Vitamin D deficiency 06/18/2015  . Hypokalemia 06/03/2015  . Chronic anemia 05/30/2015  . Chronic pain syndrome 03/03/2015  . Essential hypertension   . Chronic constipation 11/26/2014  . Depression 11/26/2014  . Insomnia 06/03/2013  . Neurogenic bladder 06/03/2013  . Paraplegia ([place]) 09/05/2012  . Chronic combined systolic and diastolic congestive heart failure ([place]) 05/22/2012  . Obesity 11/30/2008  . Multiple sclerosis ([place]) 11/30/2008  . Hypertensive heart disease with CHF (congestive heart failure) ([place]) 11/30/2008    Past Surgical History:  Procedure Laterality Date  . ABLATION     uterine  . CESAREAN SECTION       OB History   No obstetric history on file.      Home Medications    Prior to Admission medications   Medication Sig Start Date End Date Taking? Authorizing Provider  acetaminophen (TYLENOL) 325 MG tablet Take 650 mg by mouth 3 (three) times daily.    [provider]  albuterol (PROVENTIL) (2.5 MG/3ML) 0.083% nebulizer solution Take 3 mLs (2.5 mg total) by nebulization every 2 (  two) hours as needed for wheezing. 11/10/15   Regalado, Belkys A, MD  amantadine (SYMMETREL) 100 MG capsule Take 100 mg by mouth daily.     [provider]  atorvastatin (LIPITOR) 10 MG tablet Take 10 mg by mouth at bedtime.    [provider]  Baclofen 5 MG TABS Take 1 tablet by mouth 4 (four) times daily. Hold if lethargic or confused  12/04/17   [provider]  benzocaine (HURRICAINE) 20 % GEL Use as directed  1 application in the mouth or throat 2 (two) times daily. 02/19/18   [provider]  buPROPion (WELLBUTRIN) 100 MG tablet Take 100 mg by mouth daily. 04/18/18   [provider]  carvedilol (COREG) 25 MG tablet Take 1 tablet (25 mg total) by mouth 2 (two) times daily with a meal. 07/25/15   Rai, Ripudeep K, MD  Cholecalciferol (VITAMIN D-3 PO) Take 1,000 Int'l Units/day by mouth at bedtime.     [provider]  CRANBERRY PO Take 100 mg by mouth at bedtime.     [provider]  DULOXETINE HCL PO Take 90 mg by mouth daily.  03/07/18   [provider]  famotidine (PEPCID) 20 MG tablet Take 1 tablet (20 mg total) by mouth 2 (two) times daily. 07/25/15   Rai, Vernelle Emerald, MD  ferrous sulfate 325 (65 FE) MG tablet Take 325 mg by mouth daily with breakfast. 02/20/18   [provider]  fluticasone (FLONASE) 50 MCG/ACT nasal spray Place 2 sprays into both nostrils every evening.    [provider]  GABAPENTIN PO Give 1000 mg by mouth two times daily. 06/19/17   [provider]  hydrocortisone cream 0.5 % Apply to cheeks topically two times a day for dry areas, avoid eyes    [provider]  ipratropium-albuterol (DUONEB) 0.5-2.5 (3) MG/3ML SOLN Inhale 3 mLs into the lungs every 6 (six) hours. 09/03/17   [provider]  Lactase (LACTAID PO) Take 1 tablet by mouth at bedtime.     [provider]  latanoprost (XALATAN) 0.005 % ophthalmic solution Place 1 drop into both eyes at bedtime.    [provider]  linaclotide Rolan Lipa) 290 MCG CAPS capsule Take 290 mcg by mouth daily before breakfast.  11/20/17   [provider]  loratadine (CLARITIN) 10 MG tablet Take 10 mg by mouth daily.    [provider]  Melatonin 5 MG TABS Take 2 tablets by mouth at bedtime.  11/14/17   [provider]  Menthol, Topical Analgesic, (BENGAY EX) Apply topically. Apply to shoulders every 12 hours as needed for  pain    [provider]  Menthol-Methyl Salicylate (MUSCLE RUB) 10-15 % CREA Apply to shoulders topically two times a day for pain    [provider]  mirabegron ER (MYRBETRIQ) 50 MG TB24 tablet Take 50 mg by mouth at bedtime.     [provider]  omeprazole (PRILOSEC) 20 MG capsule Take 20 mg by mouth daily.    [provider]  ondansetron (ZOFRAN) 4 MG tablet Take 4 mg by mouth every 6 (six) hours as needed for nausea or vomiting. 11/01/17   [provider]  pantoprazole (PROTONIX) 40 MG tablet Take 40 mg by mouth daily. 02/20/18 05/22/18  [provider]  phenazopyridine (PYRIDIUM) 100 MG tablet Take 100 mg by mouth 2 (two) times daily.  01/12/18   [provider]  potassium chloride (KLOR-CON) 20 MEQ packet Take 20 mEq  by mouth daily.     [provider]  Propylene Glycol (SYSTANE BALANCE) 0.6 % SOLN Instill 1 drop in both eyes two times a day for dry eyes    [provider]  senna-docusate (SENOKOT S) 8.6-50 MG tablet Take 2 tablets by mouth 2 (two) times daily.     [provider]  spironolactone (ALDACTONE) 25 MG tablet Take 25 mg by mouth daily. For CHF    [provider]  tamsulosin (FLOMAX) 0.4 MG CAPS capsule Take 0.4 mg by mouth daily. For incontinence    [provider]  Teriflunomide (AUBAGIO) 14 MG TABS Take by mouth.    [provider]  torsemide (DEMADEX) 20 MG tablet Take 20 mg by mouth daily.    [provider]  traMADol (ULTRAM) 50 MG tablet Take 50 mg by mouth every 8 (eight) hours as needed.    [provider]    Family History Family History  Problem Relation Age of Onset  . Hypertension Mother   . Cancer Mother        breast   . Cancer Father        prostate  . Multiple sclerosis Sister   . Heart attack Neg Hx     Social History Social History   Tobacco Use  . Smoking status: Former Smoker    Packs/day: 0.50    Years: 5.00     Pack years: 2.50  . Smokeless tobacco: Never Used  Substance Use Topics  . Alcohol use: No  . Drug use: No     Allergies   Sulfonamide derivatives and Penicillins   Review of Systems Review of Systems  Unable to perform ROS: Mental status change     Physical Exam Updated Vital Signs BP 90/61   Pulse (!) 109   Temp 100.3 F (37.9 C) (Oral)   Resp (!) 25   Ht '5\' 5"'$  (1.651 m)   Wt 120 kg   SpO2 97%   BMI 44.02 kg/m   Physical Exam Vitals signs and nursing note reviewed.  Constitutional:      Appearance: Shannon is well-developed. Shannon is ill-appearing.  HENT:     Head: Normocephalic and atraumatic.     Mouth/Throat:     Mouth: Mucous membranes are dry.  Eyes:     Conjunctiva/sclera: Conjunctivae normal.  Neck:     Musculoskeletal: Normal range of motion and neck supple.  Cardiovascular:     Rate and Rhythm: Regular rhythm. Tachycardia present.     Heart sounds: No murmur.  Pulmonary:     Effort: No respiratory distress.     Breath sounds: Rhonchi present.  Abdominal:     Palpations: Abdomen is soft.     Tenderness: There is no abdominal tenderness.  Skin:    General: Skin is warm and dry.  Neurological:     Mental Status: Shannon is alert.     Comments: Oriented to person, not oriented to year or place.  Psychiatric:     Comments: Unable to assess secondary to mental status change.      ED Treatments / Results  Labs (all labs ordered are listed, but only abnormal results are displayed) Labs Reviewed  APTT - Abnormal; Notable for the following components:      Result Value   aPTT 38 (*)    All other components within normal limits  D-DIMER, QUANTITATIVE (NOT AT [facility]) - Abnormal; Notable for the following components:   D-Dimer, Quant 1.92 (*)  All other components within normal limits  LACTATE DEHYDROGENASE - Abnormal; Notable for the following components:   LDH 1,183 (*)    All other components within normal limits  TRIGLYCERIDES - Abnormal; Notable  for the following components:   Triglycerides 273 (*)    All other components within normal limits  FIBRINOGEN - Abnormal; Notable for the following components:   Fibrinogen 554 (*)    All other components within normal limits  COMPREHENSIVE METABOLIC PANEL - Abnormal; Notable for the following components:   Sodium 131 (*)    Chloride 95 (*)    Glucose, Bld 342 (*)    BUN 25 (*)    Creatinine, Ser 2.54 (*)    Calcium 7.6 (*)    Albumin 3.2 (*)    AST 791 (*)    ALT 325 (*)    Alkaline Phosphatase 462 (*)    Total Bilirubin 2.7 (*)    GFR calc non Af Amer 20 (*)    GFR calc Af Amer 24 (*)    All other components within normal limits  CBC WITH DIFFERENTIAL/PLATELET - Abnormal; Notable for the following components:   Hemoglobin 11.6 (*)    HCT 35.1 (*)    Platelets 145 (*)    Lymphs Abs 0.5 (*)    All other components within normal limits  CULTURE, BLOOD (ROUTINE X 2)  CULTURE, BLOOD (ROUTINE X 2)  URINE CULTURE  SARS CORONAVIRUS 2 (TAT 6-24 HRS)  LACTIC ACID, PLASMA  PROTIME-INR  PROCALCITONIN  LACTIC ACID, PLASMA  URINALYSIS, ROUTINE W REFLEX MICROSCOPIC  CBC WITH DIFFERENTIAL/PLATELET  FERRITIN  C-REACTIVE PROTEIN    EKG None  Radiology Dg Chest Port 1 View  Result Date: 05/10/2019 CLINICAL DATA:  Patient arrived from [place]. Patient facility called due to patient being more lethargic than normal. H/o Covid-19. Cardiomyopathy, Chronic Systolic Heart Failure, HTN, MS. Former smoker. EXAM: PORTABLE CHEST 1 VIEW COMPARISON:  Chest radiograph 11/07/2015 FINDINGS: Stable cardiomediastinal contours. There are subtle airspace opacities throughout the right lung. Small linear opacities at the left base likely reflect atelectasis. No pneumothorax or large pleural effusion. No acute finding in the visualized skeleton. IMPRESSION: Asymmetric hazy airspace opacities throughout the right lung raise concern for infection versus asymmetric edema. Minimal linear opacities at  the left base likely atelectasis. Electronically Signed   By: Audie Pinto M.D.   On: 05/10/2019 16:15    Procedures .Critical Care Performed by: Lorin Glass, PA-C Authorized by: Lorin Glass, PA-C   Critical care provider statement:    Critical care time (minutes):  45   Critical care was necessary to treat or prevent imminent or life-threatening deterioration of the following conditions:  Sepsis and shock   Critical care was time spent personally by me on the following activities:  Discussions with consultants, evaluation of patient's response to treatment, examination of patient, ordering and performing treatments and interventions, ordering and review of laboratory studies, ordering and review of radiographic studies, pulse oximetry, re-evaluation of patient's condition, obtaining history from patient or surrogate and review of old charts   (including critical care time)  Medications Ordered in ED Medications  acetaminophen (TYLENOL) tablet 650 mg (has no administration in time range)  metroNIDAZOLE (FLAGYL) IVPB 500 mg (0 mg Intravenous Stopped 05/10/19 1615)  sodium chloride 0.9 % bolus 3,600 mL (0 mL/kg  120 kg Intravenous Stopped 05/10/19 1735)  vancomycin (VANCOCIN) 2,000 mg in sodium chloride 0.9 % 500 mL IVPB (2,000 mg Intravenous New Bag/Given 05/10/19 1615)  ceFEPIme (MAXIPIME) 2 g in sodium chloride 0.9 % 100 mL IVPB (0 g Intravenous Stopped 05/10/19 1615)  acetaminophen (TYLENOL) tablet 650 mg (650 mg Oral Given 05/10/19 1516)     Initial Impression / Assessment and Plan / ED Course  I have reviewed the triage vital signs and the nursing notes.  Pertinent labs & imaging results that were available during my care of the patient were reviewed by me and considered in my medical decision making (see chart for details).  Clinical Course as of May 09 1818  Nancy Fetter May 10, 2019  1430 Spoke with Neoma Laming, patient's sister whose contact information is listed in  epic.  Shannon is aware that patient is very sick and states Shannon already has an appointment with the [facility] set up.   [EH]  [place] from facility:  Reports that patient has covid, Had emesis and diarrhea.  Patient requested to go to hospital.  Shannon will fax covid result to me.     [EH]  [place] NP at the [facility] requested palliative care consult be placed.     [EH]  1704 Spoke with lab regarding CBC.  They state that they are unable to see the CBC is listed as collected since 1450.  They report that they have adequate blood, ordered add on CBC.   [EH]    Clinical Course User Index [EH] Lorin Glass, PA-C      Patient presents today for evaluation of reportedly being unresponsive at her facility however also reportedly requesting transport to the hospital.  On arrival Shannon is awake, alert and oriented to person, not to location or time.  Shannon is tachypneic and hypotensive blood pressures 78/58 and tachycardic at 120.  Shannon is also febrile with a temp of 100.3.  Code sepsis was called.  Shannon is started on broad-spectrum antibiotics.  Shannon reportedly has a positive Covid test at the facility.  I called them and asked them to fax this result to [place].  White count obtained at 7.8.  Shannon is mildly anemic with a hemoglobin of 11.6.  CMP with multiple significant derangements including AKI with a creatinine at 2.54.  Shannon has significant transaminitis with AST and ALT at 791 and 325 respectively.  Alk phos is elevated at 462.  D-dimer is elevated at 1.92, this was obtained as part of Covid risk stratification.  Fibrinogen, LDH, and PTT are elevated.  Lactic acid is not elevated at 1.8.  Procalcitonin is 104.13.  Chest x-ray shows concern for infection versus asymmetric edema.  Shannon was given 30/kg fluid bolus after which her blood pressure and mental status improved.  Shannon was reevaluated multiple times while in the emergency department under my care.  I had discussions with patient's sister, and the  NP at the facility regarding patient's wishes.  I spoke with palliative care who will see the patient tomorrow.    Patient does have MOLST form and DNR, both of which were viewed.   This patient was seen as a shared visit with Dr. Darl Householder who will speak with hospitalist for admission.    Final Clinical Impressions(s) / ED Diagnoses   Final diagnoses:  COVID-19  Hypotension, unspecified hypotension type  Transaminitis  AKI (acute kidney injury) [facility])    ED Discharge Orders    None       Ollen Gross 05/10/19 1819    Drenda Freeze, MD 05/10/19 2103

## 2019-05-11 ENCOUNTER — Other Ambulatory Visit: Payer: Self-pay

## 2019-05-11 DIAGNOSIS — I959 Hypotension, unspecified: Secondary | ICD-10-CM

## 2019-05-11 LAB — COMPREHENSIVE METABOLIC PANEL
ALT: 272 U/L — ABNORMAL HIGH (ref 0–44)
AST: 492 U/L — ABNORMAL HIGH (ref 15–41)
Albumin: 2.7 g/dL — ABNORMAL LOW (ref 3.5–5.0)
Alkaline Phosphatase: 395 U/L — ABNORMAL HIGH (ref 38–126)
Anion gap: 11 (ref 5–15)
BUN: 24 mg/dL — ABNORMAL HIGH (ref 6–20)
CO2: 22 mmol/L (ref 22–32)
Calcium: 7.3 mg/dL — ABNORMAL LOW (ref 8.9–10.3)
Chloride: 107 mmol/L (ref 98–111)
Creatinine, Ser: 1.67 mg/dL — ABNORMAL HIGH (ref 0.44–1.00)
GFR calc Af Amer: 39 mL/min — ABNORMAL LOW (ref >60)
GFR calc non Af Amer: 34 mL/min — ABNORMAL LOW (ref >60)
Glucose, Bld: 261 mg/dL — ABNORMAL HIGH (ref 70–99)
Potassium: 4 mmol/L (ref 3.5–5.1)
Sodium: 140 mmol/L (ref 135–145)
Total Bilirubin: 2.4 mg/dL — ABNORMAL HIGH (ref 0.3–1.2)
Total Protein: 6.1 g/dL — ABNORMAL LOW (ref 6.5–8.1)

## 2019-05-11 LAB — CBC
HCT: 30.5 % — ABNORMAL LOW (ref 36.0–46.0)
Hemoglobin: 9.9 g/dL — ABNORMAL LOW (ref 12.0–15.0)
MCH: 28.2 pg (ref 26.0–34.0)
MCHC: 32.5 g/dL (ref 30.0–36.0)
MCV: 86.9 fL (ref 80.0–100.0)
Platelets: 144 10*3/uL — ABNORMAL LOW (ref 150–400)
RBC: 3.51 MIL/uL — ABNORMAL LOW (ref 3.87–5.11)
RDW: 15.3 % (ref 11.5–15.5)
WBC: 9.2 10*3/uL (ref 4.0–10.5)
nRBC: 0 % (ref 0.0–0.2)

## 2019-05-11 LAB — CBG MONITORING, ED: Glucose-Capillary: 230 mg/dL — ABNORMAL HIGH (ref 70–99)

## 2019-05-11 LAB — URINALYSIS, ROUTINE W REFLEX MICROSCOPIC
Bilirubin Urine: NEGATIVE
Glucose, UA: NEGATIVE mg/dL
Ketones, ur: NEGATIVE mg/dL
Nitrite: NEGATIVE
Protein, ur: NEGATIVE mg/dL
Specific Gravity, Urine: 1.006 (ref 1.005–1.030)
pH: 5 (ref 5.0–8.0)

## 2019-05-11 LAB — HEMOGLOBIN A1C
Hgb A1c MFr Bld: 7.6 % — ABNORMAL HIGH (ref 4.8–5.6)
Mean Plasma Glucose: 171.42 mg/dL

## 2019-05-11 LAB — TYPE AND SCREEN
ABO/RH(D): A POS
Antibody Screen: NEGATIVE

## 2019-05-11 LAB — PROCALCITONIN: Procalcitonin: 66.07 ng/mL

## 2019-05-11 LAB — ABO/RH: ABO/RH(D): A POS

## 2019-05-11 LAB — FERRITIN: Ferritin: 1904 ng/mL — ABNORMAL HIGH (ref 11–307)

## 2019-05-11 LAB — GLUCOSE, CAPILLARY
Glucose-Capillary: 224 mg/dL — ABNORMAL HIGH (ref 70–99)
Glucose-Capillary: 272 mg/dL — ABNORMAL HIGH (ref 70–99)

## 2019-05-11 LAB — SARS CORONAVIRUS 2 (TAT 6-24 HRS): SARS Coronavirus 2: POSITIVE — AB

## 2019-05-11 LAB — C-REACTIVE PROTEIN: CRP: 19.6 mg/dL — ABNORMAL HIGH (ref <1.0)

## 2019-05-11 LAB — HIV ANTIBODY (ROUTINE TESTING W REFLEX): HIV Screen 4th Generation wRfx: NONREACTIVE

## 2019-05-11 MED ORDER — METHYLPREDNISOLONE SODIUM SUCC 125 MG IJ SOLR
60.0000 mg | INTRAMUSCULAR | Status: DC
  Administered 2019-05-11 – 2019-05-12 (×2): 60 mg via INTRAVENOUS
  Filled 2019-05-11 (×2): qty 2

## 2019-05-11 MED ORDER — ZINC SULFATE 220 (50 ZN) MG PO CAPS: 220.0000 mg | ORAL_CAPSULE | Freq: Every day | ORAL | Status: DC

## 2019-05-11 MED ORDER — QUERCETIN 250 MG PO TABS: 250.0000 mg | ORAL_TABLET | Freq: Two times a day (BID) | ORAL | Status: DC

## 2019-05-11 MED ORDER — BUPROPION HCL ER (XL) 150 MG PO TB24
150.0000 mg | ORAL_TABLET | Freq: Every day | ORAL | Status: DC
  Administered 2019-05-12 – 2019-05-18 (×8): 150 mg via ORAL
  Filled 2019-05-11 (×8): qty 1

## 2019-05-11 MED ORDER — VITAMIN C 500 MG PO TABS
500.0000 mg | ORAL_TABLET | Freq: Two times a day (BID) | ORAL | Status: DC
  Administered 2019-05-11 – 2019-05-18 (×14): 500 mg via ORAL
  Filled 2019-05-11 (×14): qty 1

## 2019-05-11 MED ORDER — GABAPENTIN 800 MG PO TABS
400.0000 mg | ORAL_TABLET | Freq: Two times a day (BID) | ORAL | Status: DC
  Filled 2019-05-11 (×3): qty 0.5

## 2019-05-11 MED ORDER — RISAQUAD PO CAPS
1.0000 | ORAL_CAPSULE | ORAL | Status: DC
  Administered 2019-05-12 – 2019-05-18 (×4): 1 via ORAL
  Filled 2019-05-11 (×5): qty 1

## 2019-05-11 MED ORDER — GABAPENTIN 300 MG PO CAPS
400.0000 mg | ORAL_CAPSULE | Freq: Two times a day (BID) | ORAL | Status: DC
  Administered 2019-05-11: 400 mg via ORAL
  Filled 2019-05-11 (×2): qty 1

## 2019-05-11 MED ORDER — TRAMADOL HCL 50 MG PO TABS
50.0000 mg | ORAL_TABLET | Freq: Three times a day (TID) | ORAL | Status: DC | PRN
  Administered 2019-05-11 – 2019-05-17 (×8): 50 mg via ORAL
  Filled 2019-05-11 (×8): qty 1

## 2019-05-11 MED ORDER — SODIUM CHLORIDE 0.9 % IV SOLN
INTRAVENOUS | Status: AC
  Administered 2019-05-11: 12:00:00 via INTRAVENOUS

## 2019-05-11 MED ORDER — ADULT MULTIVITAMIN W/MINERALS CH
1.0000 | ORAL_TABLET | Freq: Every day | ORAL | Status: DC
  Administered 2019-05-11 – 2019-05-18 (×8): 1 via ORAL
  Filled 2019-05-11 (×8): qty 1

## 2019-05-11 MED ORDER — B COMPLEX-C PO TABS
1.0000 | ORAL_TABLET | Freq: Every day | ORAL | Status: DC
  Administered 2019-05-11 – 2019-05-18 (×8): 1 via ORAL
  Filled 2019-05-11 (×9): qty 1

## 2019-05-11 NOTE — Progress Notes (Signed)
RN educates patient on the use of the flutter valve and incentive spirometer. Patient demonstrates the use of both instruments. Patient needs reinforcement at this time. Will continue to monitor patient.

## 2019-05-11 NOTE — Evaluation (Signed)
Clinical/Bedside Swallow Evaluation Patient Details  Name: Shannon Garrett MRN: [id number] Date of Birth: 12-May-1961  Today's Date: 05/11/2019 Time: SLP Start Time (ACUTE ONLY): [id number] SLP Stop Time (ACUTE ONLY): 1340 SLP Time Calculation (min) (ACUTE ONLY): 18 min  Past Medical History:  Past Medical History:  Diagnosis Date  . Cardiomyopathy ([place])    a.  Echo 04/29/12: Mild LVH, EF 20-25%, mild AI, moderate MR, moderate LAE, mild RAE, mild RVE, moderate TR, PASP 51, small pericardial effusion;   b. probably non-ischemic given multiple chemo-Tx agents used for MS and global LV dysfn on echo  . Chronic systolic heart failure ([place])   . COVID-19 05/10/2019   Per facility paperwork  . Depression   . Glaucoma   . Hypertension   . MS (multiple sclerosis) ([place])    a. Dx'd late 20's. b. Tx with Novantrone, Tysabri, Copaxone previously.   Past Surgical History:  Past Surgical History:  Procedure Laterality Date  . ABLATION     uterine  . CESAREAN SECTION     HPI:  Pt is a 58 yo female admitted with hypotension and AMS after testing positive for COVID-19 one week prior. She has has several BSEs in the past (most recent;y in May 2017), all of which have recommended thin liquids. Dependent on her mentation, she has required intermittent use of softer diets. PMH: MS (bedbound), GERD, HTN, glaucoma, depression, cardiomyopathy, CHF, neurogenic bladder with recurrent UTIs   Assessment / Plan / Recommendation Clinical Impression  Pt has a baseline congested cough that limits clinical assessment with POs. Her swallow otherwise appears swift, but she does not try anything more solid on her lunch tray than her magic cup. Her baseline cough persisted throughout intake regardless of consistency, with audible congestion that could not be cleared despite additional cueing for more effortful cough. She did not seem to cough immediately after swallowing, even when challenged with large, consecutive straw sips.  Pt says that her coughing has been ongoing throughout the day - not associated with POs. Attempted to leave a message with RN to see if she agrees. Will leave pt on current diet with use of aspiration precautions and careful monitoring. If coughing seems to increase during PO intake, would hold POs and notify SLP.  SLP Visit Diagnosis: Dysphagia, unspecified (R13.10)    Aspiration Risk  Moderate aspiration risk;Mild aspiration risk    Diet Recommendation Dysphagia 3 (Mech soft);Thin liquid   Liquid Administration via: Cup;Straw Medication Administration: Whole meds with puree Supervision: Staff to assist with self feeding;Full supervision/cueing for compensatory strategies Compensations: Slow rate;Small sips/bites Postural Changes: Seated upright at 90 degrees;Remain upright for at least 30 minutes after po intake    Other  Recommendations Oral Care Recommendations: Oral care BID   Follow up Recommendations (tba)      Frequency and Duration min 2x/week  2 weeks       Prognosis Prognosis for Safe Diet Advancement: Good Barriers to Reach Goals: Cognitive deficits      Swallow Study   General HPI: Pt is a 58 yo female admitted with hypotension and AMS after testing positive for COVID-19 one week prior. She has has several BSEs in the past (most recent;y in May 2017), all of which have recommended thin liquids. Dependent on her mentation, she has required intermittent use of softer diets. PMH: MS (bedbound), GERD, HTN, glaucoma, depression, cardiomyopathy, CHF, neurogenic bladder with recurrent UTIs Type of Study: Bedside Swallow Evaluation Previous Swallow Assessment: see HPI Diet Prior to this Study:  Dysphagia 3 (soft);Thin liquids Temperature Spikes Noted: Yes(100.3) Respiratory Status: Room air History of Recent Intubation: No Behavior/Cognition: Alert;Cooperative;Requires cueing Oral Care Completed by SLP: No Vision: Functional for self-feeding Self-Feeding Abilities: Needs  set up;Needs assist Patient Positioning: Upright in bed Baseline Vocal Quality: Normal Volitional Cough: Wet;Congested;Weak    Oral/Motor/Sensory Function Overall Oral Motor/Sensory Function: Within functional limits   Ice Chips Ice chips: Not tested   Thin Liquid Thin Liquid: Impaired Presentation: Self Fed;Straw Pharyngeal  Phase Impairments: Cough - Delayed    Nectar Thick Nectar Thick Liquid: Not tested   Honey Thick Honey Thick Liquid: Not tested   Puree Puree: Impaired Presentation: Self Fed;Spoon Pharyngeal Phase Impairments: Cough - Delayed   Solid     Solid: Not tested(pt declined anything more solid than puree)      Venita Sheffield Oralia Criger 05/11/2019,3:44 PM  Pollyann Glen, M.A. [place] Acute Environmental education officer [phone] Office [phone]

## 2019-05-11 NOTE — Progress Notes (Signed)
Patient transferred from [facility] to [facility], alert oriented times 4, no c/o pain at this time. Vital signs within normal range, on room air. Patient is in bed locked at lower level call light, bedside table and telephone within patient reach. Will continue to monitor patient.

## 2019-05-11 NOTE — Progress Notes (Signed)
PROGRESS NOTE                                                                                                                                                                                                             Patient Demographics:    Shannon Garrett, is a 58 y.o. female, DOB - 1960-10-09, JIZ:[id number]  Outpatient Primary MD for the patient is Gildardo Cranker, DO    LOS - 1  Admit date - 05/10/2019    Chief Complaint  Patient presents with  . COVID       Brief Narrative  Shannon Garrett is a 58 y.o. female with past medical history of advanced multiple sclerosis, she is bedbound, HTN, cardiomyopathy and neurogenic bladder with recurrent UTIs.  She lives in SNF, she was sent to the hospital because of lethargy and hypotension, further work-up showed patient had sepsis, hypotension and AKI due to COVID-19 infection and she was sent to [facility] for further care.   Subjective:    Shannon Garrett today has, No headache, No chest pain, No abdominal pain - No Nausea, No new weakness tingling or numbness, no Cough - SOB.    Assessment  & Plan :     1. Sepsis and AKI due to Acute Covid 19 Viral Pneumonitis during the ongoing 2020 Covid 19 Pandemic - she was treated with aggressive IV fluids, continue IV steroids, will add remdesivir once ALT levels are better and under 200, inflammatory markers are significantly elevated, continue with supportive care thankfully so far she is not hypoxic.  If her disease progresses will consider using Actemra or convalescent plasma.  Sister to do research and let us know about her decision. Stop all antibiotics and trend procalcitonin and blood and urine culture results.   Actemra off label use - patient was and primary decision maker Sister Neoma Laming were told that if COVID-19 pneumonitis gets worse we might potentially use Actemra off label, she denies any known history of tuberculosis or hepatitis,  understands the risks and benefits and wants to proceed with Actemra treatment if required.  Sister Neoma Laming will do some research and then tell us about her decision on Actemra use  COVID-19 Labs  Recent Labs    05/10/19 1452  DDIMER 1.92*  FERRITIN 1,904*  LDH 1,183*  CRP 19.6*    Lab Results  Component Value  Date   SARSCOV2NAA POSITIVE (A) 05/10/2019    SpO2: 100 %  Hepatic Function Latest Ref Rng & Units 05/11/2019 05/10/2019 08/26/2018  Total Protein 6.5 - 8.1 g/dL 6.1(L) 6.7 6.4  Albumin 3.5 - 5.0 g/dL 2.7(L) 3.2(L) 3.9  AST 15 - 41 U/L 492(H) 791(H) 20  ALT 0 - 44 U/L 272(H) 325(H) 30  Alk Phosphatase 38 - 126 U/L 395(H) 462(H) 191(H)  Total Bilirubin 0.3 - 1.2 mg/dL 2.4(H) 2.7(H) 0.2  Bilirubin, Direct 0.00 - 0.40 mg/dL - - -    Lab Results  Component Value Date   INR 1.1 05/10/2019   INR 1.1 05/10/2019   INR 1.27 07/04/2015    No results found for: BNP  2.  Advanced MS.  Bedbound SNF resident.  Supportive care.  3. Recurrent UTIs.  Supportive care  4.  AKI.  Due to COVID-19 infection.  Has been hydrated.  Hold diuretic for now.   5.  Transaminitis.  Due to hypotension and Covid.  Asymptomatic, treat Covid, hydrate and monitor liver profiles carefully.  LFTs are trending down.  Will trend INR as well.  6. GERD - PPI.    Condition - Extremely Guarded  Family Communication  :  Sister and main decision maker Neoma Laming on 05/11/2019 at 11:50 AM.  Code Status :  DNR  Diet :   Diet Order            DIET DYS 3 Room service appropriate? Yes; Fluid consistency: Thin  Diet effective now               Disposition Plan  :  SNF  Consults  :  None  Procedures  :     PUD Prophylaxis : PPI  DVT Prophylaxis  :   Heparin   Lab Results  Component Value Date   PLT 144 (L) 05/11/2019    Inpatient Medications  Scheduled Meds: . amantadine  100 mg Oral Daily  . b complex vitamins  1 tablet Oral Daily  . [START ON 05/12/2019] buPROPion  150 mg Oral QPC  breakfast  . gabapentin  400 mg Oral BID  . heparin  5,000 Units Subcutaneous Q8H  . insulin aspart  0-15 Units Subcutaneous TID WC  . Melatonin  2 tablet Oral QHS  . methylPREDNISolone (SOLU-MEDROL) injection  60 mg Intravenous Q24H  . mirabegron ER  50 mg Oral QHS  . Multivitamin Adult   Oral Daily  . pantoprazole  40 mg Oral Daily  . Probiotic Acidophilus  1 capsule Oral QODAY  . Quercetin  250 mg Oral BID  . vitamin C  1,000 mg Oral BID  . ascorbic acid  500 mg Oral BID  . zinc sulfate  220 mg Oral Daily  . zinc sulfate  220 mg Oral Daily   Continuous Infusions: . sodium chloride 100 mL/hr at 05/10/19 2330  . ceFEPime (MAXIPIME) IV Stopped (05/11/19 0905)   PRN Meds:.acetaminophen **OR** acetaminophen, acetaminophen, [DISCONTINUED] ondansetron **OR** ondansetron (ZOFRAN) IV  Antibiotics  :    Anti-infectives (From admission, onward)   Start     Dose/Rate Route Frequency Ordered Stop   05/11/19 2000  vancomycin (VANCOCIN) IVPB 1000 mg/200 mL premix  Status:  Discontinued     1,000 mg 200 mL/hr over 60 Minutes Intravenous Every 36 hours 05/10/19 2057 05/10/19 2112   05/11/19 2000  vancomycin (VANCOCIN) 1,250 mg in sodium chloride 0.9 % 250 mL IVPB  Status:  Discontinued     1,250 mg 166.7 mL/hr over  90 Minutes Intravenous Every 36 hours 05/10/19 2112 05/11/19 1118   05/11/19 0600  ceFEPIme (MAXIPIME) 2 g in sodium chloride 0.9 % 100 mL IVPB     2 g 200 mL/hr over 30 Minutes Intravenous Every 12 hours 05/10/19 2057     05/10/19 1445  vancomycin (VANCOCIN) 2,000 mg in sodium chloride 0.9 % 500 mL IVPB     2,000 mg 250 mL/hr over 120 Minutes Intravenous  Once 05/10/19 1438 05/10/19 2009   05/10/19 1445  ceFEPIme (MAXIPIME) 2 g in sodium chloride 0.9 % 100 mL IVPB     2 g 200 mL/hr over 30 Minutes Intravenous  Once 05/10/19 1442 05/10/19 1615   05/10/19 1415  aztreonam (AZACTAM) 2 g in sodium chloride 0.9 % 100 mL IVPB  Status:  Discontinued     2 g 200 mL/hr over 30 Minutes  Intravenous  Once 05/10/19 1414 05/10/19 1440   05/10/19 1415  metroNIDAZOLE (FLAGYL) IVPB 500 mg     500 mg 100 mL/hr over 60 Minutes Intravenous  Once 05/10/19 1414 05/10/19 1615   05/10/19 1415  vancomycin (VANCOCIN) IVPB 1000 mg/200 mL premix  Status:  Discontinued     1,000 mg 200 mL/hr over 60 Minutes Intravenous  Once 05/10/19 1414 05/10/19 1438       Time Spent in minutes  30   Lala Lund M.D on 05/11/2019 at 11:51 AM  To page go to [email or web address] - password [facility]  [facility] -  Office  [phone]   See all Orders from today for further details    Objective:   Vitals:   05/11/19 0700 05/11/19 0800 05/11/19 0906 05/11/19 1000  BP: 115/66 104/77 (!) 109/57 121/69  Pulse: 89 87  91  Resp: 19 (!) 31 (!) 28 (!) 22  Temp:    98.3 F (36.8 C)  TempSrc:    Oral  SpO2: 97% 93%  100%  Weight:      Height:        Wt Readings from Last 3 Encounters:  05/10/19 120 kg  04/21/18 119.5 kg  02/24/18 123.8 kg     Intake/Output Summary (Last 24 hours) at 05/11/2019 1151 Last data filed at 05/11/2019 0550 Gross per 24 hour  Intake 3800 ml  Output 950 ml  Net 2850 ml     Physical Exam  Awake Alert, chronic diffuse generalized weakness legs more than upper arms, no new F.N deficits, Normal affect [place].AT,PERRAL Supple Neck,No JVD, No cervical lymphadenopathy appriciated.  Symmetrical Chest wall movement, Good air movement bilaterally, CTAB RRR,No Gallops,Rubs or new Murmurs, No Parasternal Heave +ve B.Sounds, Abd Soft, No tenderness, No organomegaly appriciated, No rebound - guarding or rigidity. No Cyanosis, Clubbing or edema, No new Rash or bruise       Data Review:    CBC Recent Labs  Lab 05/10/19 1705 05/10/19 1956 05/11/19 0447  WBC 7.9 10.7* 9.2  HGB 11.6* 9.9* 9.9*  HCT 35.1* 30.0* 30.5*  PLT 145* 128* 144*  MCV 86.2 87.5 86.9  MCH 28.5 28.9 28.2  MCHC 33.0 33.0 32.5  RDW 15.1 15.2 15.3  LYMPHSABS 0.5*  --   --   MONOABS 0.5  --   --    EOSABS 0.0  --   --   BASOSABS 0.0  --   --     Chemistries  Recent Labs  Lab 05/10/19 1615 05/10/19 1956 05/11/19 0447  NA 131*  --  140  K 4.2  --  4.0  CL 95*  --  107  CO2 23  --  22  GLUCOSE 342*  --  261*  BUN 25*  --  24*  CREATININE 2.54* 1.95* 1.67*  CALCIUM 7.6*  --  7.3*  AST 791*  --  492*  ALT 325*  --  272*  ALKPHOS 462*  --  395*  BILITOT 2.7*  --  2.4*   ------------------------------------------------------------------------------------------------------------------ Recent Labs    05/10/19 1452  TRIG 273*    Lab Results  Component Value Date   HGBA1C 7.6 (H) 05/10/2019   ------------------------------------------------------------------------------------------------------------------ Recent Labs    05/10/19 1956  TSH 2.526    Cardiac Enzymes No results for input(s): CKMB, TROPONINI, MYOGLOBIN in the last 168 hours.  Invalid input(s): CK ------------------------------------------------------------------------------------------------------------------ No results found for: BNP  Micro Results Recent Results (from the past 240 hour(s))  Blood Culture (routine x 2)     Status: None (Preliminary result)   Collection Time: 05/10/19  2:52 PM   Specimen: BLOOD  Result Value Ref Range Status   Specimen Description   Final    BLOOD RIGHT FOREARM Performed at [facility] [address], [place], [place] [address]    Special Requests   Final    BOTTLES DRAWN AEROBIC AND ANAEROBIC Blood Culture results may not be optimal due to an excessive volume of blood received in culture bottles Performed at [facility] [address] [place], [place] [address]    Culture   Final    NO GROWTH < 24 HOURS Performed at [facility] [address] [place], [place] [address]    Report Status PENDING  Incomplete  Blood Culture (routine x 2)     Status: None (Preliminary result)   Collection Time: 05/10/19  2:52  PM   Specimen: BLOOD  Result Value Ref Range Status   Specimen Description   Final    BLOOD RIGHT FOREARM Performed at [facility] [address] [place], [place] [address]    Special Requests   Final    BOTTLES DRAWN AEROBIC AND ANAEROBIC Blood Culture results may not be optimal due to an excessive volume of blood received in culture bottles Performed at [facility] [address], [place], [place] [address]    Culture   Final    NO GROWTH < 24 HOURS Performed at [facility] [address], [place], [place] [address]    Report Status PENDING  Incomplete  SARS CORONAVIRUS 2 (TAT 6-24 HRS) Nasopharyngeal Nasopharyngeal Swab     Status: Abnormal   Collection Time: 05/10/19  5:40 PM   Specimen: Nasopharyngeal Swab  Result Value Ref Range Status   SARS Coronavirus 2 POSITIVE (A) NEGATIVE Final    Comment: RESULT CALLED TO, READ BACK BY AND VERIFIED WITH: Helane Rima, RN AT [phone] ON 05/11/2019 BY SAINVILUS S (NOTE) SARS-CoV-2 target nucleic acids are DETECTED. The SARS-CoV-2 RNA is generally detectable in upper and lower respiratory specimens during the acute phase of infection. Positive results are indicative of active infection with SARS-CoV-2. Clinical  correlation with patient history and other diagnostic information is necessary to determine patient infection status. Positive results do  not rule out bacterial infection or co-infection with other viruses. The expected result is Negative. Fact Sheet for Patients: SugarRoll.be Fact Sheet for Healthcare Providers: https://www.woods-mathews.com/ This test is not yet approved or cleared by the [place] FDA and  has been authorized for detection and/or diagnosis of SARS-CoV-2 by FDA under an Emergency  Use Authorization (EUA). This EUA will remain  in effect (meaning this test  can be used) for the duration of the COVID-19 declaration  under Section 564(b)(1) of the Act, 21 U.S.C. section 360bbb-3(b)(1), unless the authorization is terminated or revoked sooner. Performed at [facility], [place] [address] [place], [place] [address]     Radiology Reports Dg Chest [place] 1 View  Result Date: 05/10/2019 CLINICAL DATA:  Patient arrived from [place]. Patient facility called due to patient being more lethargic than normal. H/o Covid-19. Cardiomyopathy, Chronic Systolic Heart Failure, HTN, MS. Former smoker. EXAM: PORTABLE CHEST 1 VIEW COMPARISON:  Chest radiograph 11/07/2015 FINDINGS: Stable cardiomediastinal contours. There are subtle airspace opacities throughout the right lung. Small linear opacities at the left base likely reflect atelectasis. No pneumothorax or large pleural effusion. No acute finding in the visualized skeleton. IMPRESSION: Asymmetric hazy airspace opacities throughout the right lung raise concern for infection versus asymmetric edema. Minimal linear opacities at the left base likely atelectasis. Electronically Signed   By: Audie Pinto M.D.   On: 05/10/2019 16:15

## 2019-05-11 NOTE — ED Notes (Signed)
[facility] called to transport to [facility].

## 2019-05-11 NOTE — ED Notes (Addendum)
Per the [facility], the pt will not be transported upstairs until after shift change.

## 2019-05-11 NOTE — ED Notes (Signed)
ED TO INPATIENT HANDOFF REPORT  Name/Age/Gender Shannon Garrett 58 y.o. female  Code Status    Code Status Orders  (From admission, onward)         Start     Ordered   05/10/19 1941  Do not attempt resuscitation (DNR)  Continuous    Question Answer Comment  In the event of cardiac or respiratory ARREST Do not call a "code blue"   In the event of cardiac or respiratory ARREST Do not perform Intubation, CPR, defibrillation or ACLS   In the event of cardiac or respiratory ARREST Use medication by any route, position, wound care, and other measures to relive pain and suffering. May use oxygen, suction and manual treatment of airway obstruction as needed for comfort.      05/10/19 1940        Code Status History    Date Active Date Inactive Code Status Order ID Comments User Context   05/10/2019 1431 05/10/2019 1940 DNR [id number]  Lorin Glass, PA-C ED   11/07/2015 2300 11/10/2015 1615 DNR [id number]  Norval Morton, MD ED   07/21/2015 1338 07/25/2015 2137 DNR [id number]  Rush Farmer, MD Inpatient   07/10/2015 2024 07/21/2015 1338 Full Code [id number]  Juanito Doom, MD Inpatient   07/04/2015 1647 07/08/2015 1403 Full Code [id number]  Louellen Molder, MD Inpatient   06/08/2015 2210 06/12/2015 1610 Full Code [id number]  Theressa Millard, MD Inpatient   05/31/2015 0334 06/04/2015 1731 Full Code [id number]  Reubin Milan, MD Inpatient   01/11/2015 0242 01/17/2015 1846 Full Code [id number]  Allyne Gee, MD Inpatient   02/23/2014 2232 01/11/2015 0242 DNR [id number]  Hennie Duos, MD Outpatient   02/20/2014 0358 02/22/2014 1937 DNR [id number]  Toy Baker, MD ED   11/04/2013 1353 02/20/2014 0358 Full Code [id number]  Hennie Duos, MD Outpatient   07/31/2013 1932 08/02/2013 1650 Full Code [id number]  Allie Bossier, MD Inpatient   05/25/2013 0200 05/26/2013 1653 Full Code [id number]  Rise Patience, MD ED   09/10/2012 0211 09/11/2012 1806 Full Code [id number]  Samuella Cota, MD Inpatient   09/04/2012 1605 09/08/2012 2046 Full Code [id number]  Nita Sells, MD Inpatient   04/28/2012 2308 05/01/2012 1928 Full Code [id number]  Domingo Dimes, RN Inpatient   Advance Care Planning Activity    Advance Directive Documentation     Most Recent Value  Type of Advance Directive  Out of facility DNR (pink MOST or yellow form), Living will  Pre-existing out of facility DNR order (yellow form or pink MOST form)  -  "MOST" Form in Place?  -      Home/SNF/Other Nursing Home  Chief Complaint COVID positive  Level of Care/Admitting Diagnosis ED Disposition    ED Disposition Condition [place]: [place] [[id number]]  Level of Care: Telemetry [5]  Admit to tele based on following criteria: Other see comments  Comments: [facility]Abile BP  Covid Evaluation: Person Under Investigation (PUI)  [facility]: [id number]  Admitting Physician: Verlee Monte [[id number]]  Attending Physician: Verlee Monte [[id number]]  Estimated length of stay: past midnight tomorrow  Certification:: I certify this patient will need inpatient services for at least 2 midnights  PT Class (Do Not Modify): Inpatient [101]  PT Acc Code (Do Not Modify): Private [1]       Medical History Past Medical History:  Diagnosis Date  . Cardiomyopathy ([place])    a.  Echo 04/29/12:  Mild LVH, EF 20-25%, mild AI, moderate MR, moderate LAE, mild RAE, mild RVE, moderate TR, PASP 51, small pericardial effusion;   b. probably non-ischemic given multiple chemo-Tx agents used for MS and global LV dysfn on echo  . Chronic systolic heart failure ([place])   . COVID-19 05/10/2019   Per facility paperwork  . Depression   . Glaucoma   . Hypertension   . MS (multiple sclerosis) ([place])    a. Dx'd late 20's. b. Tx with Novantrone, Tysabri, Copaxone previously.    Allergies Allergies  Allergen Reactions  . Sulfonamide Derivatives Hives and Shortness Of Breath  . Penicillins  Hives and Itching    Has patient had a PCN reaction causing immediate rash, facial/tongue/throat swelling, SOB or lightheadedness with hypotension: unknown Has patient had a PCN reaction causing severe rash involving mucus membranes or skin necrosis: unknown Has patient had a PCN reaction that required hospitalization: unknown Has patient had a PCN reaction occurring within the last 10 years: unknown If all of the above answers are "NO", then may proceed with Cephalosporin use. Prior course of rocephin charted 05/2015    IV Location/Drains/Wounds Patient Lines/Drains/Airways Status   Active Line/Drains/Airways    Name:   Placement date:   Placement time:   Site:   Days:   Peripheral IV 05/10/19 Anterior;Right Hand   05/10/19    1332    Hand   1   Peripheral IV 05/10/19 Right;Distal Forearm   05/10/19    1456    Forearm   1   Peripheral IV 05/10/19 Right;Upper Forearm   05/10/19    1457    Forearm   1          Labs/Imaging Results for orders placed or performed during the hospital encounter of 05/10/19 (from the past 48 hour(s))  Lactic acid, plasma     Status: None   Collection Time: 05/10/19  2:52 PM  Result Value Ref Range   Lactic Acid, Venous 1.8 0.5 - 1.9 mmol/L    Comment: Performed at [facility], [place] [address] [place], [place] [address]  APTT     Status: Abnormal   Collection Time: 05/10/19  2:52 PM  Result Value Ref Range   aPTT 38 (H) 24 - 36 seconds    Comment:        IF BASELINE aPTT IS ELEVATED, SUGGEST PATIENT RISK ASSESSMENT BE USED TO DETERMINE APPROPRIATE ANTICOAGULANT THERAPY. Performed at [facility], [place] [address], [place], [place] [address]   Protime-INR     Status: None   Collection Time: 05/10/19  2:52 PM  Result Value Ref Range   Prothrombin Time 13.7 11.4 - 15.2 seconds   INR 1.1 0.8 - 1.2    Comment: (NOTE) INR goal varies based on device and disease states. Performed at [facility],  [place] [address], [place], [place] [address]   D-dimer, quantitative     Status: Abnormal   Collection Time: 05/10/19  2:52 PM  Result Value Ref Range   D-Dimer, Quant 1.92 (H) 0.00 - 0.50 ug/mL-FEU    Comment: (NOTE) At the manufacturer cut-off of 0.50 ug/mL FEU, this assay has been documented to exclude PE with a sensitivity and negative predictive value of 97 to 99%.  At this time, this assay has not been approved by the FDA to exclude DVT/VTE. Results should be correlated with clinical presentation. Performed at [facility], [place] [address], [place], [place] [address]  Procalcitonin     Status: None   Collection Time: 05/10/19  2:52 PM  Result Value Ref Range   Procalcitonin 104.13 ng/mL    Comment:        Interpretation: PCT >= 10 ng/mL: Important systemic inflammatory response, almost exclusively due to severe bacterial sepsis or septic shock. (NOTE)       Sepsis PCT Algorithm           Lower Respiratory Tract                                      Infection PCT Algorithm    ----------------------------     ----------------------------         PCT < 0.25 ng/mL                PCT < 0.10 ng/mL         Strongly encourage             Strongly discourage   discontinuation of antibiotics    initiation of antibiotics    ----------------------------     -----------------------------       PCT 0.25 - 0.50 ng/mL            PCT 0.10 - 0.25 ng/mL               OR       >80% decrease in PCT            Discourage initiation of                                            antibiotics      Encourage discontinuation           of antibiotics    ----------------------------     -----------------------------         PCT >= 0.50 ng/mL              PCT 0.26 - 0.50 ng/mL                AND       <80% decrease in PCT             Encourage initiation of                                             antibiotics       Encourage continuation           of antibiotics     ----------------------------     -----------------------------        PCT >= 0.50 ng/mL                  PCT > 0.50 ng/mL               AND         increase in PCT                  Strongly encourage  initiation of antibiotics    Strongly encourage escalation           of antibiotics                                     -----------------------------                                           PCT <= 0.25 ng/mL                                                 OR                                        > 80% decrease in PCT                                     Discontinue / Do not initiate                                             antibiotics Performed at [place] [address], [place], [place] [address]   Lactate dehydrogenase     Status: Abnormal   Collection Time: 05/10/19  2:52 PM  Result Value Ref Range   LDH 1,183 (H) 98 - 192 U/L    Comment: Performed at [facility], [place] [address], [place], [place] [address]  Ferritin     Status: Abnormal   Collection Time: 05/10/19  2:52 PM  Result Value Ref Range   Ferritin 1,904 (H) 11 - 307 ng/mL    Comment: Performed at [facility], [place] [address], [place], [place] [address]  Triglycerides     Status: Abnormal   Collection Time: 05/10/19  2:52 PM  Result Value Ref Range   Triglycerides 273 (H) <150 mg/dL    Comment: Performed at [facility], [place] [address], [place], [place] [address]  Fibrinogen     Status: Abnormal   Collection Time: 05/10/19  2:52 PM  Result Value Ref Range   Fibrinogen 554 (H) 210 - 475 mg/dL    Comment: Performed at [facility], [place] [address], [place], [place] [address]  C-reactive protein     Status: Abnormal   Collection Time: 05/10/19  2:52 PM  Result Value Ref Range   CRP 19.6 (H) <1.0 mg/dL    Comment: Performed at [facility], [place] [address],  [place], [place] [address]  Comprehensive metabolic panel     Status: Abnormal   Collection Time: 05/10/19  4:15 PM  Result Value Ref Range   Sodium 131 (L) 135 - 145 mmol/L   Potassium 4.2 3.5 - 5.1 mmol/L   Chloride 95 (L) 98 - 111 mmol/L   CO2 23 22 - 32 mmol/L   Glucose, Bld 342 (H) 70 - 99 mg/dL  BUN 25 (H) 6 - 20 mg/dL   Creatinine, Ser 2.54 (H) 0.44 - 1.00 mg/dL   Calcium 7.6 (L) 8.9 - 10.3 mg/dL   Total Protein 6.7 6.5 - 8.1 g/dL   Albumin 3.2 (L) 3.5 - 5.0 g/dL   AST 791 (H) 15 - 41 U/L   ALT 325 (H) 0 - 44 U/L   Alkaline Phosphatase 462 (H) 38 - 126 U/L   Total Bilirubin 2.7 (H) 0.3 - 1.2 mg/dL   GFR calc non Af Amer 20 (L) >60 mL/min   GFR calc Af Amer 24 (L) >60 mL/min   Anion gap 13 5 - 15    Comment: Performed at [facility], [place] [address], [place], [place] [address]  CBC with Differential     Status: Abnormal   Collection Time: 05/10/19  5:05 PM  Result Value Ref Range   WBC 7.9 4.0 - 10.5 K/uL   RBC 4.07 3.87 - 5.11 MIL/uL   Hemoglobin 11.6 (L) 12.0 - 15.0 g/dL   HCT 35.1 (L) 36.0 - 46.0 %   MCV 86.2 80.0 - 100.0 fL   MCH 28.5 26.0 - 34.0 pg   MCHC 33.0 30.0 - 36.0 g/dL   RDW 15.1 11.5 - 15.5 %   Platelets 145 (L) 150 - 400 K/uL   nRBC 0.0 0.0 - 0.2 %   Neutrophils Relative % 87 %   Neutro Abs 6.8 1.7 - 7.7 K/uL   Lymphocytes Relative 6 %   Lymphs Abs 0.5 (L) 0.7 - 4.0 K/uL   Monocytes Relative 6 %   Monocytes Absolute 0.5 0.1 - 1.0 K/uL   Eosinophils Relative 0 %   Eosinophils Absolute 0.0 0.0 - 0.5 K/uL   Basophils Relative 0 %   Basophils Absolute 0.0 0.0 - 0.1 K/uL   Immature Granulocytes 1 %   Abs Immature Granulocytes 0.05 0.00 - 0.07 K/uL    Comment: Performed at [facility], [place] [address] [place], [place] [address]  Lactic acid, plasma     Status: None   Collection Time: 05/10/19  5:40 PM  Result Value Ref Range   Lactic Acid, Venous 1.2 0.5 - 1.9 mmol/L    Comment: Performed at [facility], [place] [address], [place], [place] [address]  Hemoglobin A1c     Status: Abnormal   Collection Time: 05/10/19  7:55 PM  Result Value Ref Range   Hgb A1c MFr Bld 7.6 (H) 4.8 - 5.6 %    Comment: (NOTE) Pre diabetes:          5.7%-6.4% Diabetes:              >6.4% Glycemic control for   <7.0% adults with diabetes    Mean Plasma Glucose 171.42 mg/dL    Comment: Performed at [place] [address], [place], [place] [address]  CBC     Status: Abnormal   Collection Time: 05/10/19  7:56 PM  Result Value Ref Range   WBC 10.7 (H) 4.0 - 10.5 K/uL   RBC 3.43 (L) 3.87 - 5.11 MIL/uL   Hemoglobin 9.9 (L) 12.0 - 15.0 g/dL   HCT 30.0 (L) 36.0 - 46.0 %   MCV 87.5 80.0 - 100.0 fL   MCH 28.9 26.0 - 34.0 pg   MCHC 33.0 30.0 - 36.0 g/dL   RDW 15.2 11.5 - 15.5 %   Platelets 128 (L) 150 - 400 K/uL   nRBC 0.0 0.0 - 0.2 %  Comment: Performed at [facility], [place] [address], [place], [place] [address]  Creatinine, serum     Status: Abnormal   Collection Time: 05/10/19  7:56 PM  Result Value Ref Range   Creatinine, Ser 1.95 (H) 0.44 - 1.00 mg/dL   GFR calc non Af Amer 28 (L) >60 mL/min   GFR calc Af Amer 33 (L) >60 mL/min    Comment: Performed at [facility], [place] [address], [place], [place] [address]  HIV Antibody (routine testing w rflx)     Status: None   Collection Time: 05/10/19  7:56 PM  Result Value Ref Range   HIV Screen 4th Generation wRfx NON REACTIVE NON REACTIVE    Comment: Performed at [facility], [place] [address] [place], [place] [address]  Protime-INR     Status: None   Collection Time: 05/10/19  7:56 PM  Result Value Ref Range   Prothrombin Time 13.9 11.4 - 15.2 seconds   INR 1.1 0.8 - 1.2    Comment: (NOTE) INR goal varies based on device and disease states. Performed at [facility], [place] [address] [place], [place] [address]   TSH     Status: None   Collection Time: 05/10/19  7:56 PM   Result Value Ref Range   TSH 2.526 0.350 - 4.500 uIU/mL    Comment: Performed by a 3rd Generation assay with a functional sensitivity of <=0.01 uIU/mL. Performed at [facility], [place] [address] [place] [address], [place], [place] [address]   CBG monitoring, ED     Status: Abnormal   Collection Time: 05/10/19 11:41 PM  Result Value Ref Range   Glucose-Capillary 213 (H) 70 - 99 mg/dL  Urinalysis, Routine w reflex microscopic     Status: Abnormal   Collection Time: 05/10/19 11:50 PM  Result Value Ref Range   Color, Urine YELLOW YELLOW   APPearance HAZY (A) CLEAR   Specific Gravity, Urine 1.006 1.005 - 1.030   pH 5.0 5.0 - 8.0   Glucose, UA NEGATIVE NEGATIVE mg/dL   Hgb urine dipstick MODERATE (A) NEGATIVE   Bilirubin Urine NEGATIVE NEGATIVE   Ketones, ur NEGATIVE NEGATIVE mg/dL   Protein, ur NEGATIVE NEGATIVE mg/dL   Nitrite NEGATIVE NEGATIVE   Leukocytes,Ua LARGE (A) NEGATIVE   RBC / HPF 0-5 0 - 5 RBC/hpf   WBC, UA 11-20 0 - 5 WBC/hpf   Bacteria, UA RARE (A) NONE SEEN   Squamous Epithelial / LPF 0-5 0 - 5    Comment: Performed at [facility], [place] [address] [place], [place] [address]   Dg Chest Port 1 View  Result Date: 05/10/2019 CLINICAL DATA:  Patient arrived from [place]. Patient facility called due to patient being more lethargic than normal. H/o Covid-19. Cardiomyopathy, Chronic Systolic Heart Failure, HTN, MS. Former smoker. EXAM: PORTABLE CHEST 1 VIEW COMPARISON:  Chest radiograph 11/07/2015 FINDINGS: Stable cardiomediastinal contours. There are subtle airspace opacities throughout the right lung. Small linear opacities at the left base likely reflect atelectasis. No pneumothorax or large pleural effusion. No acute finding in the visualized skeleton. IMPRESSION: Asymmetric hazy airspace opacities throughout the right lung raise concern for infection versus asymmetric edema. Minimal linear opacities at the left base likely atelectasis. Electronically  Signed   By: Audie Pinto M.D.   On: 05/10/2019 16:15    Pending Labs Unresulted Labs (From admission, onward)    Start     Ordered   05/11/19 [id number]  Basic metabolic  panel  Daily,   R     05/10/19 1940   05/11/19 0500  CBC  Daily,   R     05/10/19 1940   05/11/19 0500  CMP in am  Daily,   R     05/10/19 1955   05/10/19 1652  SARS CORONAVIRUS 2 (TAT 6-24 HRS) Nasopharyngeal Nasopharyngeal Swab  (Asymptomatic/Tier 2 Patients Labs)  Once,   STAT    Question Answer Comment  Is this test for diagnosis or screening Diagnosis of ill patient   Symptomatic for COVID-19 as defined by CDC Yes   Date of Symptom Onset 05/03/2019   Hospitalized for COVID-19 Yes   Admitted to ICU for COVID-19 No   Previously tested for COVID-19 No   Resident in a congregate (group) care setting Yes   Employed in healthcare setting No   Pregnant No      05/10/19 1652   05/10/19 1410  CBC WITH DIFFERENTIAL  ONCE - STAT,   STAT     05/10/19 1414   05/10/19 1409  Blood Culture (routine x 2)  BLOOD CULTURE X 2,   STAT     05/10/19 1414   05/10/19 1409  Urine culture  ONCE - STAT,   STAT     05/10/19 1414          Vitals/Pain Today's Vitals   05/11/19 0300 05/11/19 0330 05/11/19 0400 05/11/19 0500  BP: 99/63 (!) 99/58 (!) 98/59 (!) 96/58  Pulse: 91 90 90 91  Resp: (!) 21 19 18  (!) 28  Temp:      TempSrc:      SpO2: 97% 97% 97% 100%  Weight:      Height:      PainSc:        Isolation Precautions Airborne and Contact precautions  Medications Medications  acetaminophen (TYLENOL) tablet 650 mg (has no administration in time range)  pantoprazole (PROTONIX) EC tablet 40 mg (40 mg Oral Given 05/10/19 2333)  mirabegron ER (MYRBETRIQ) tablet 50 mg (50 mg Oral Not Given 05/11/19 0317)  Melatonin TABS 10 mg (10 mg Oral Given 05/10/19 2333)  amantadine (SYMMETREL) capsule 100 mg (100 mg Oral Given 05/10/19 2334)  heparin injection 5,000 Units (5,000 Units Subcutaneous Given 05/10/19 2334)  0.9 %  sodium  chloride infusion ( Intravenous New Bag/Given 05/10/19 2330)  acetaminophen (TYLENOL) tablet 650 mg (has no administration in time range)    Or  acetaminophen (TYLENOL) suppository 650 mg (has no administration in time range)  HYDROcodone-acetaminophen (NORCO/VICODIN) 5-325 MG per tablet 1-2 tablet (has no administration in time range)  ondansetron (ZOFRAN) tablet 4 mg (has no administration in time range)    Or  ondansetron (ZOFRAN) injection 4 mg (has no administration in time range)  zinc sulfate capsule 220 mg (220 mg Oral Given 05/10/19 2332)  vitamin C (ASCORBIC ACID) tablet 1,000 mg (1,000 mg Oral Given 05/10/19 2332)  dexamethasone (DECADRON) injection 6 mg (6 mg Intravenous Given 05/10/19 2330)  insulin aspart (novoLOG) injection 0-15 Units (has no administration in time range)  ceFEPIme (MAXIPIME) 2 g in sodium chloride 0.9 % 100 mL IVPB (has no administration in time range)  vancomycin (VANCOCIN) 1,250 mg in sodium chloride 0.9 % 250 mL IVPB (has no administration in time range)  metroNIDAZOLE (FLAGYL) IVPB 500 mg (0 mg Intravenous Stopped 05/10/19 1615)  sodium chloride 0.9 % bolus 3,600 mL (0 mL/kg  120 kg Intravenous Stopped 05/10/19 1735)  vancomycin (VANCOCIN) 2,000 mg in sodium chloride 0.9 %  500 mL IVPB (0 mg Intravenous Stopped 05/10/19 2009)  ceFEPIme (MAXIPIME) 2 g in sodium chloride 0.9 % 100 mL IVPB (0 g Intravenous Stopped 05/10/19 1615)  acetaminophen (TYLENOL) tablet 650 mg (650 mg Oral Given 05/10/19 1516)    Mobility non-ambulatory

## 2019-05-11 NOTE — Progress Notes (Signed)
MEDICATION RELATED CONSULT NOTE - INITIAL   Pharmacy Consult for Remdesivir  Indication: COVID-19   Allergies  Allergen Reactions  . Sulfonamide Derivatives Hives and Shortness Of Breath  . Penicillins Hives and Itching    Has patient had a PCN reaction causing immediate rash, facial/tongue/throat swelling, SOB or lightheadedness with hypotension: unknown Has patient had a PCN reaction causing severe rash involving mucus membranes or skin necrosis: unknown Has patient had a PCN reaction that required hospitalization: unknown Has patient had a PCN reaction occurring within the last 10 years: unknown If all of the above answers are "NO", then may proceed with Cephalosporin use. Prior course of rocephin charted 05/2015    Patient Measurements: Height: 5\' 5"  (165.1 cm) Weight: 264 lb 8.8 oz (120 kg) IBW/kg (Calculated) : 57  Vital Signs: Temp: 98.3 F (36.8 C) (11/09 1000) Temp Source: Oral (11/09 1000) BP: 121/69 (11/09 1000) Pulse Rate: 91 (11/09 1000) Intake/Output from previous day: 11/08 0701 - 11/09 0700 In: 3800 [IV Piggyback:3800] Out: 950 [Urine:950] Intake/Output from this shift: No intake/output data recorded.  Labs: Recent Labs    05/10/19 1452 05/10/19 1615 05/10/19 1705 05/10/19 1956 05/11/19 0447  WBC  --   --  7.9 10.7* 9.2  HGB  --   --  11.6* 9.9* 9.9*  HCT  --   --  35.1* 30.0* 30.5*  PLT  --   --  145* 128* 144*  APTT 38*  --   --   --   --   CREATININE  --  2.54*  --  1.95* 1.67*  ALBUMIN  --  3.2*  --   --  2.7*  PROT  --  6.7  --   --  6.1*  AST  --  791*  --   --  492*  ALT  --  325*  --   --  272*  ALKPHOS  --  462*  --   --  395*  BILITOT  --  2.7*  --   --  2.4*   Estimated Creatinine Clearance: 48.8 mL/min (A) (by C-G formula based on SCr of 1.67 mg/dL (H)).   Microbiology: Recent Results (from the past 720 hour(s))  Blood Culture (routine x 2)     Status: None (Preliminary result)   Collection Time: 05/10/19  2:52 PM   Specimen:  BLOOD  Result Value Ref Range Status   Specimen Description   Final    BLOOD RIGHT FOREARM Performed at [facility] [address] [place], [place] [address]    Special Requests   Final    BOTTLES DRAWN AEROBIC AND ANAEROBIC Blood Culture results may not be optimal due to an excessive volume of blood received in culture bottles Performed at [facility] [address], [place], [place] [address]    Culture   Final    NO GROWTH < 24 HOURS Performed at [facility] [address], [place], [place] [address]    Report Status PENDING  Incomplete  Blood Culture (routine x 2)     Status: None (Preliminary result)   Collection Time: 05/10/19  2:52 PM   Specimen: BLOOD  Result Value Ref Range Status   Specimen Description   Final    BLOOD RIGHT FOREARM Performed at [facility] [address], [place], [place] [address]    Special Requests   Final    BOTTLES DRAWN AEROBIC AND ANAEROBIC Blood Culture results may not be optimal  due to an excessive volume of blood received in culture bottles Performed at [facility] [address] [place], [place] [address]    Culture   Final    NO GROWTH < 24 HOURS Performed at [facility] [address] [place], [place] [address]    Report Status PENDING  Incomplete  SARS CORONAVIRUS 2 (TAT 6-24 HRS) Nasopharyngeal Nasopharyngeal Swab     Status: Abnormal   Collection Time: 05/10/19  5:40 PM   Specimen: Nasopharyngeal Swab  Result Value Ref Range Status   SARS Coronavirus 2 POSITIVE (A) NEGATIVE Final    Comment: RESULT CALLED TO, READ BACK BY AND VERIFIED WITH: Helane Rima, RN AT [phone] ON 05/11/2019 BY SAINVILUS S (NOTE) SARS-CoV-2 target nucleic acids are DETECTED. The SARS-CoV-2 RNA is generally detectable in upper and lower respiratory specimens during the acute phase of infection. Positive results are indicative of active infection with  SARS-CoV-2. Clinical  correlation with patient history and other diagnostic information is necessary to determine patient infection status. Positive results do  not rule out bacterial infection or co-infection with other viruses. The expected result is Negative. Fact Sheet for Patients: SugarRoll.be Fact Sheet for Healthcare Providers: https://www.woods-mathews.com/ This test is not yet approved or cleared by the [place] FDA and  has been authorized for detection and/or diagnosis of SARS-CoV-2 by FDA under an Emergency Use Authorization (EUA). This EUA will remain  in effect (meaning this test  can be used) for the duration of the COVID-19 declaration under Section 564(b)(1) of the Act, 21 U.S.C. section 360bbb-3(b)(1), unless the authorization is terminated or revoked sooner. Performed at [facility], [place] [address] [place], [place] [address]     Medical History: Past Medical History:  Diagnosis Date  . Cardiomyopathy ([place])    a.  Echo 04/29/12: Mild LVH, EF 20-25%, mild AI, moderate MR, moderate LAE, mild RAE, mild RVE, moderate TR, PASP 51, small pericardial effusion;   b. probably non-ischemic given multiple chemo-Tx agents used for MS and global LV dysfn on echo  . Chronic systolic heart failure ([place])   . COVID-19 05/10/2019   Per facility paperwork  . Depression   . Glaucoma   . Hypertension   . MS (multiple sclerosis) ([place])    a. Dx'd late 20's. b. Tx with Novantrone, Tysabri, Copaxone previously.   Assessment: 62 YOF from SNF admitted with lethargy and hypotension. Positive for COVID-19. Pharmacy consulted to start 5-day course of Remdesivir. ALT today is greater than 5x ULN. This was discussed with MD who wants to hold dose for today   Plan:  Hold remdesivir today. F/u ALT tomorrow  Albertina Parr, PharmD., BCPS Clinical Pharmacist Clinical phone for 05/11/19 until 5pm: [phone]

## 2019-05-11 NOTE — ED Notes (Signed)
Pt will be transported upstairs once room is inspected by facilities (neg pressure)

## 2019-05-12 DIAGNOSIS — Z515 Encounter for palliative care: Secondary | ICD-10-CM

## 2019-05-12 DIAGNOSIS — R7401 Elevation of levels of liver transaminase levels: Secondary | ICD-10-CM

## 2019-05-12 DIAGNOSIS — U071 COVID-19: Secondary | ICD-10-CM

## 2019-05-12 DIAGNOSIS — R627 Adult failure to thrive: Secondary | ICD-10-CM

## 2019-05-12 DIAGNOSIS — N179 Acute kidney failure, unspecified: Secondary | ICD-10-CM

## 2019-05-12 DIAGNOSIS — A419 Sepsis, unspecified organism: Secondary | ICD-10-CM

## 2019-05-12 DIAGNOSIS — Z66 Do not resuscitate: Secondary | ICD-10-CM

## 2019-05-12 DIAGNOSIS — G35 Multiple sclerosis: Secondary | ICD-10-CM

## 2019-05-12 LAB — MAGNESIUM: Magnesium: 1.6 mg/dL — ABNORMAL LOW (ref 1.7–2.4)

## 2019-05-12 LAB — URINE CULTURE: Culture: 40000 — AB

## 2019-05-12 LAB — GLUCOSE, CAPILLARY
Glucose-Capillary: 301 mg/dL — ABNORMAL HIGH (ref 70–99)
Glucose-Capillary: 296 mg/dL — ABNORMAL HIGH (ref 70–99)
Glucose-Capillary: 285 mg/dL — ABNORMAL HIGH (ref 70–99)
Glucose-Capillary: 277 mg/dL — ABNORMAL HIGH (ref 70–99)

## 2019-05-12 LAB — CBC WITH DIFFERENTIAL/PLATELET
Abs Immature Granulocytes: 0.11 10*3/uL — ABNORMAL HIGH (ref 0.00–0.07)
Basophils Absolute: 0 10*3/uL (ref 0.0–0.1)
Basophils Relative: 0 %
Eosinophils Absolute: 0 10*3/uL (ref 0.0–0.5)
Eosinophils Relative: 0 %
HCT: 29.9 % — ABNORMAL LOW (ref 36.0–46.0)
Hemoglobin: 9.9 g/dL — ABNORMAL LOW (ref 12.0–15.0)
Immature Granulocytes: 1 %
Lymphocytes Relative: 6 %
Lymphs Abs: 0.5 10*3/uL — ABNORMAL LOW (ref 0.7–4.0)
MCH: 28.3 pg (ref 26.0–34.0)
MCHC: 33.1 g/dL (ref 30.0–36.0)
MCV: 85.4 fL (ref 80.0–100.0)
Monocytes Absolute: 0.3 10*3/uL (ref 0.1–1.0)
Monocytes Relative: 4 %
Neutro Abs: 7.4 10*3/uL (ref 1.7–7.7)
Neutrophils Relative %: 89 %
Platelets: 173 10*3/uL (ref 150–400)
RBC: 3.5 MIL/uL — ABNORMAL LOW (ref 3.87–5.11)
RDW: 15.4 % (ref 11.5–15.5)
WBC: 8.3 10*3/uL (ref 4.0–10.5)
nRBC: 0 % (ref 0.0–0.2)

## 2019-05-12 LAB — C-REACTIVE PROTEIN: CRP: 18.2 mg/dL — ABNORMAL HIGH (ref <1.0)

## 2019-05-12 LAB — COMPREHENSIVE METABOLIC PANEL
ALT: 234 U/L — ABNORMAL HIGH (ref 0–44)
AST: 266 U/L — ABNORMAL HIGH (ref 15–41)
Albumin: 2.8 g/dL — ABNORMAL LOW (ref 3.5–5.0)
Alkaline Phosphatase: 391 U/L — ABNORMAL HIGH (ref 38–126)
Anion gap: 13 (ref 5–15)
BUN: 30 mg/dL — ABNORMAL HIGH (ref 6–20)
CO2: 19 mmol/L — ABNORMAL LOW (ref 22–32)
Calcium: 7.7 mg/dL — ABNORMAL LOW (ref 8.9–10.3)
Chloride: 108 mmol/L (ref 98–111)
Creatinine, Ser: 1.62 mg/dL — ABNORMAL HIGH (ref 0.44–1.00)
GFR calc Af Amer: 41 mL/min — ABNORMAL LOW (ref >60)
GFR calc non Af Amer: 35 mL/min — ABNORMAL LOW (ref >60)
Glucose, Bld: 347 mg/dL — ABNORMAL HIGH (ref 70–99)
Potassium: 4.1 mmol/L (ref 3.5–5.1)
Sodium: 140 mmol/L (ref 135–145)
Total Bilirubin: 1.4 mg/dL — ABNORMAL HIGH (ref 0.3–1.2)
Total Protein: 6.1 g/dL — ABNORMAL LOW (ref 6.5–8.1)

## 2019-05-12 LAB — BRAIN NATRIURETIC PEPTIDE: B Natriuretic Peptide: 214 pg/mL — ABNORMAL HIGH (ref 0.0–100.0)

## 2019-05-12 LAB — PROCALCITONIN: Procalcitonin: 50.59 ng/mL

## 2019-05-12 LAB — PROTIME-INR
INR: 1 (ref 0.8–1.2)
Prothrombin Time: 13.5 seconds (ref 11.4–15.2)

## 2019-05-12 LAB — D-DIMER, QUANTITATIVE: D-Dimer, Quant: 1.94 ug/mL-FEU — ABNORMAL HIGH (ref 0.00–0.50)

## 2019-05-12 MED ORDER — ZOLPIDEM TARTRATE 5 MG PO TABS
5.0000 mg | ORAL_TABLET | Freq: Every evening | ORAL | Status: DC | PRN
  Administered 2019-05-12 – 2019-05-17 (×5): 5 mg via ORAL
  Filled 2019-05-12 (×5): qty 1

## 2019-05-12 MED ORDER — GABAPENTIN 300 MG PO CAPS: 400.0000 mg | ORAL_CAPSULE | Freq: Two times a day (BID) | ORAL | Status: DC

## 2019-05-12 MED ORDER — DEXAMETHASONE SODIUM PHOSPHATE 10 MG/ML IJ SOLN
6.0000 mg | Freq: Every day | INTRAMUSCULAR | Status: DC
  Administered 2019-05-13 – 2019-05-14 (×2): 6 mg via INTRAVENOUS
  Filled 2019-05-12 (×2): qty 1

## 2019-05-12 MED ORDER — GABAPENTIN 400 MG PO CAPS
400.0000 mg | ORAL_CAPSULE | Freq: Two times a day (BID) | ORAL | Status: DC
  Administered 2019-05-12 – 2019-05-18 (×13): 400 mg via ORAL
  Filled 2019-05-12: qty 4
  Filled 2019-05-12 (×2): qty 1
  Filled 2019-05-12: qty 4
  Filled 2019-05-12 (×2): qty 1
  Filled 2019-05-12: qty 4
  Filled 2019-05-12 (×2): qty 1
  Filled 2019-05-12: qty 4
  Filled 2019-05-12 (×4): qty 1
  Filled 2019-05-12 (×2): qty 4
  Filled 2019-05-12: qty 1
  Filled 2019-05-12: qty 4
  Filled 2019-05-12 (×4): qty 1

## 2019-05-12 MED ORDER — SODIUM CHLORIDE 0.9 % IV SOLN
1.0000 g | Freq: Three times a day (TID) | INTRAVENOUS | Status: DC
  Administered 2019-05-12 – 2019-05-18 (×17): 1 g via INTRAVENOUS
  Filled 2019-05-12 (×19): qty 1

## 2019-05-12 NOTE — Consult Note (Signed)
Consultation Note Date: 05/12/2019   Patient Name: Shannon Garrett  DOB: Aug 19, 1960  MRN: [id number]  Age / Sex: 58 y.o., female  PCP: Gildardo Cranker, DO Referring Physician: Albertine Patricia, MD  Reason for Consultation: Establishing goals of care and Psychosocial/spiritual support  HPI/Patient Profile: 58 y.o. female  admitted on 05/10/2019 with  past medical history of advanced multiple sclerosis, she is bedbound, HTN, cardiomyopathy and neurogenic bladder with recurrent UTIs.  She lives in SNF, she was sent to the hospital because of lethargy and hypotension.   History obtained from [facility] records.  Apparently there is an outbreak of COVID-19 in her nursing home, she tested positive a week ago, reportedly.  But today she was lethargic, blood pressure was low so she was sent to the hospital for further evaluation.  I was able to speak to her Sister Shannon Garrett today and she tells me that the patient has had continued physical, functional and cognitive decline over the last several years.  She also speaks to her significant "mental health history"/depression and anxiety and more specifically memory loss.   Patient and family face treatment option decisions, advanced directive decisions, and anticipatory care needs   Clinical Assessment and Goals of Care:   This NP Wadie Lessen reviewed medical records, received report from team, and then spoke to her Sister Shannon Garrett to discuss diagnosis, prognosis, GOC, EOL wishes disposition and options.  Concept of Hospice and Palliative Care were discussed  A  discussion was had today regarding advanced directives.  Concepts specific to code status, artifical feeding and hydration, continued IV antibiotics and rehospitalization was had.  The difference between a aggressive medical intervention path  and a palliative comfort care path for this patient at this time was had.   Values and goals of care important to patient and family were attempted to be elicited.   Natural trajectory and expectations at EOL were discussed.  Questions and concerns addressed.   Family encouraged to call with questions or concerns.    PMT will continue to support holistically.     Patient does not have a documented healthcare power of attorney however there is a completed MOST form in her advanced care planning documents.  This MOST form reflects patient's desire for comfort.  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  Of note patient does have a daughter, Shannon Garrett who lives out of state.  I have attempted to contact her multiple times without success.  Although her phone number is set up I cannot leave a message, voice mail is full.   Patient's sister suggests that perhaps her daughter does not have capacity to make decisions for her mom.   SUMMARY OF RECOMMENDATIONS    Code Status/Advance Care Planning:  DNR     Palliative Prophylaxis:   Aspiration, Bowel Regimen, Delirium Protocol, Frequent Pain Assessment and Oral Care  Additional Recommendations (Limitations, Scope, Preferences):  Full Scope Treatment  Psycho-social/Spiritual:  Desire for further Chaplaincy support:yes  Additional Recommendations: Education on Hospice  Prognosis:   Unable to determine   Prognosis is dependent on disease progression and patient's desire for life prolonging measures.  Discharge Planning: SNF for long term care---family does not want patient to return to [place].   Discussed with Manuela Schwartz case Freight forwarder at [employer].  Recommend outpatient community-based palliative services for this patient on discharge.     Primary Diagnoses: Present on Admission: . Sepsis ([place])   I have reviewed the medical record, interviewed the patient and  family, and examined the patient. The following aspects are pertinent.  Past Medical History:  Diagnosis Date  . Cardiomyopathy ([place])    a.  Echo 04/29/12: Mild LVH, EF 20-25%, mild AI, moderate MR, moderate LAE, mild RAE, mild RVE, moderate TR, PASP 51, small pericardial effusion;   b. probably non-ischemic given multiple chemo-Tx agents used for MS and global LV dysfn on echo  . Chronic systolic heart failure ([place])   . COVID-19 05/10/2019   Per facility paperwork  . Depression   . Glaucoma   . Hypertension   . MS (multiple sclerosis) ([place])    a. Dx'd late 20's. b. Tx with Novantrone, Tysabri, Copaxone previously.   Social History   Socioeconomic History  . Marital status: Single    Spouse name: Not on file  . Number of children: Not on file  . Years of education: Not on file  . Highest education level: Not on file  Occupational History  . Not on file  Social Needs  . Financial resource strain: Not hard at all  . Food insecurity    Worry: Never true    Inability: Never true  . Transportation needs    Medical: No    Non-medical: No  Tobacco Use  . Smoking status: Former Smoker    Packs/day: 0.50    Years: 5.00    Pack years: 2.50  . Smokeless tobacco: Never Used  Substance and Sexual Activity  . Alcohol use: No  . Drug use: No  . Sexual activity: Not Currently  Lifestyle  . Physical activity    Days per week: 0 days    Minutes per session: 0 min  . Stress: Only a little  Relationships  . Social Herbalist on phone: More than three times a week    Gets together: Twice a week    Attends religious service: Never    Active member of club or organization: No    Attends meetings of clubs or organizations: Never    Relationship status: Never married  Other Topics Concern  . Not on file  Social History Narrative  . Not on file   Family History  Problem Relation Age of Onset  . Hypertension Mother   . Cancer Mother        breast   . Cancer Father         prostate  . Multiple sclerosis Sister   . Heart attack Neg Hx    Scheduled Meds: . acidophilus  1 capsule Oral QODAY  . amantadine  100 mg Oral Daily  . B-complex with vitamin C  1 tablet Oral Daily  . buPROPion  150 mg Oral QPC breakfast  . [START ON 05/13/2019] dexamethasone (DECADRON) injection  6 mg Intravenous Daily  . gabapentin  400 mg Oral BID  . heparin  5,000 Units Subcutaneous Q8H  . insulin aspart  0-15 Units Subcutaneous TID WC  .  Melatonin  2 tablet Oral QHS  . mirabegron ER  50 mg Oral QHS  . multivitamin with minerals  1 tablet Oral Daily  . pantoprazole  40 mg Oral Daily  . ascorbic acid  500 mg Oral BID  . zinc sulfate  220 mg Oral Daily   Continuous Infusions: . meropenem (MERREM) IV     PRN Meds:.acetaminophen **OR** acetaminophen, acetaminophen, [DISCONTINUED] ondansetron **OR** ondansetron (ZOFRAN) IV, traMADol Medications Prior to Admission:  Prior to Admission medications   Medication Sig Start Date End Date Taking? Authorizing Provider  acetaminophen (TYLENOL) 325 MG tablet Take 650 mg by mouth 3 (three) times daily.   Yes [provider]  albuterol (PROVENTIL) (2.5 MG/3ML) 0.083% nebulizer solution Take 3 mLs (2.5 mg total) by nebulization every 2 (two) hours as needed for wheezing. 11/10/15  Yes Regalado, Belkys A, MD  amantadine (SYMMETREL) 100 MG capsule Take 100 mg by mouth daily.    Yes [provider]  ascorbic acid (VITAMIN C) 500 MG tablet Take 500 mg by mouth 2 (two) times daily.   Yes [provider]  b complex vitamins tablet Take 1 tablet by mouth daily.   Yes [provider]  Baclofen 5 MG TABS Take 5 mg by mouth 4 (four) times daily. Hold if lethargic or confused  12/04/17  Yes [provider]  buPROPion (WELLBUTRIN XL) 150 MG 24 hr tablet Take 150 mg by mouth daily after breakfast.   Yes [provider]  carvedilol (COREG) 25 MG tablet Take 1 tablet (25 mg total) by mouth 2 (two) times  daily with a meal. 07/25/15  Yes Rai, Ripudeep K, MD  Cholecalciferol (VITAMIN D-3 PO) Take 1,000 Units by mouth at bedtime.    Yes [provider]  CRANBERRY PO Take 100 mg by mouth at bedtime.    Yes [provider]  DULOXETINE HCL PO Take 90 mg by mouth daily.  03/07/18  Yes [provider]  famotidine (PEPCID) 20 MG tablet Take 1 tablet (20 mg total) by mouth 2 (two) times daily. 07/25/15  Yes Rai, Ripudeep K, MD  ferrous sulfate 325 (65 FE) MG tablet Take 325 mg by mouth daily with breakfast. 02/20/18  Yes [provider]  fluticasone (FLONASE) 50 MCG/ACT nasal spray Place 2 sprays into both nostrils every evening.   Yes [provider]  gabapentin (NEURONTIN) 800 MG tablet Take 800 mg by mouth 3 (three) times daily.   Yes [provider]  Lactase (LACTAID PO) Take 1 tablet by mouth at bedtime.    Yes [provider]  Lactobacillus (PROBIOTIC ACIDOPHILUS) CAPS Take 1 capsule by mouth every other day.   Yes [provider]  latanoprost (XALATAN) 0.005 % ophthalmic solution Place 1 drop into both eyes at bedtime.   Yes [provider]  linaclotide Rolan Lipa) 290 MCG CAPS capsule Take 290 mcg by mouth daily before breakfast.  11/20/17  Yes [provider]  Melatonin 5 MG TABS Take 10 mg by mouth at bedtime.  11/14/17  Yes [provider]  Multiple Vitamins-Minerals (MULTIVITAMIN ADULT PO) Take 1 tablet by mouth daily.   Yes [provider]  omeprazole (PRILOSEC) 20 MG capsule Take 20 mg by mouth daily.   Yes [provider]  ondansetron (ZOFRAN) 4 MG tablet Take 4 mg by mouth every 6 (six) hours as needed for nausea or vomiting. 11/01/17  Yes [provider]  potassium chloride (KLOR-CON) 20 MEQ packet Take 20 mEq by mouth  daily.    Yes [provider]  Quercetin 250 MG TABS Take 250 mg by mouth 2 (two) times daily.   Yes [provider]  senna-docusate (SENOKOT S)  8.6-50 MG tablet Take 2 tablets by mouth 2 (two) times daily.    Yes [provider]  spironolactone (ALDACTONE) 25 MG tablet Take 25 mg by mouth daily. For CHF   Yes [provider]  tamsulosin (FLOMAX) 0.4 MG CAPS capsule Take 0.4 mg by mouth daily. For incontinence   Yes [provider]  torsemide (DEMADEX) 20 MG tablet Take 40 mg by mouth daily.    Yes [provider]  traMADol (ULTRAM) 50 MG tablet Take 50 mg by mouth every 8 (eight) hours as needed for moderate pain or severe pain.    Yes [provider]  zinc sulfate 220 (50 Zn) MG capsule Take 220 mg by mouth daily.   Yes [provider]   Allergies  Allergen Reactions  . Sulfonamide Derivatives Hives and Shortness Of Breath  . Penicillins Hives and Itching    Has patient had a PCN reaction causing immediate rash, facial/tongue/throat swelling, SOB or lightheadedness with hypotension: unknown Has patient had a PCN reaction causing severe rash involving mucus membranes or skin necrosis: unknown Has patient had a PCN reaction that required hospitalization: unknown Has patient had a PCN reaction occurring within the last 10 years: unknown If all of the above answers are "NO", then may proceed with Cephalosporin use. Prior course of rocephin charted 05/2015   Review of Systems  Unable to perform ROS   Physical Exam  Vital Signs: BP 134/69   Pulse 90   Temp 97.8 F (36.6 C) (Axillary)   Resp 18   Ht 5\' 5"  (1.651 m)   Wt 120 kg   SpO2 95%   BMI 44.02 kg/m  Pain Scale: 0-10 POSS *See Group Information*: 1-Acceptable,Awake and alert Pain Score: 0-No pain   SpO2: SpO2: 95 % O2 Device:SpO2: 95 % O2 Flow Rate: .   IO: Intake/output summary:   Intake/Output Summary (Last 24 hours) at 05/12/2019 1421 Last data filed at 05/12/2019 1100 Gross per 24 hour  Intake 2725.59 ml  Output 1050 ml  Net 1675.59 ml    LBM:   Baseline Weight: Weight: 120 kg Most recent weight:  Weight: 120 kg     Palliative Assessment/Data:  30 % at best    The above conversation was completed via telephone due to the visitor restrictions during the COVID-19 pandemic. Thorough chart review and discussion with necessary members of the care team was completed as part of assessment. All issues were discussed and addressed but no physical exam was performed.  Time In: 1400 Time Out: 1515 Time Total: 75 minutes Greater than 50%  of this time was spent counseling and coordinating care related to the above assessment and plan.  Signed by: Wadie Lessen, NP   Please contact Palliative Medicine Team phone at [phone] for questions and concerns.  For individual provider: See Shea Evans

## 2019-05-12 NOTE — Evaluation (Signed)
Occupational Therapy Evaluation Patient Details Name: Shannon Garrett MRN: [id number] DOB: 07-Nov-1960 Today's Date: 05/12/2019    History of Present Illness Shannon Garrett is a 58 y.o. female with past medical history of advanced multiple sclerosis, she is bedbound, HTN, cardiomyopathy and neurogenic bladder with recurrent UTIs.  She lives in SNF, she was sent to the hospital because of lethargy and hypotension, further work-up showed patient had sepsis, hypotension and AKI due to COVID-19 infection   Clinical Impression   PTA, pt was living at [facility] and required assistance for bathing, dressing, and toileting and used a lift for OOB; pt was performing self feeding and grooming tasks. Pt currently requiring Mod A for grooming at bed level and Total A for dressing, bathing, toileting, and bed mobility. Pt presenting with decreased strength, cognition, functional use of LUE, and activity tolerance. Pt would benefit from further acute OT to facilitate safe dc. Recommend dc to SNF for further OT to optimize safety, independence with ADLs, and return to PLOF.   Also of note, pt with bilaterally wounds at gluteal folds. RN notified.     Follow Up Recommendations  SNF;Supervision/Assistance - 24 hour(Pt's family requesting she goes to a new facility; was at [facility])    Equipment Recommendations  Other (comment)(Defer to next venue)    Recommendations for Other Services       Precautions / Restrictions Precautions Precautions: Fall Restrictions Weight Bearing Restrictions: No      Mobility Bed Mobility Overal bed mobility: Needs Assistance Bed Mobility: Rolling Rolling: +2 for safety/equipment;+2 for physical assistance;Total assist         General bed mobility comments: Total A +2 for rolling side to side for pad placement. patient did  reach for rail using right UE, able to reach over head and assist sliding up in the bed when head was down  Transfers                  General transfer comment: requires mechanical lift for OOB    Balance                                           ADL either performed or assessed with clinical judgement   ADL Overall ADL's : Needs assistance/impaired   Eating/Feeding Details (indicate cue type and reason): Able to bring cup with straw to mouth for drinking. Requiring assistance for set up and bilateral coorindation due to poor functional use of LUE Grooming: Moderate assistance;Bed level Grooming Details (indicate cue type and reason): Optimized position in bed with HOB elevated. Pt able to use RUE to bring tooth brush to mouth with tooth paste already placed on brush. Requiring assistance for holding cup to mouth for spitting. Mod A for overall assist and bilateral tasks.                                General ADL Comments: Pt requiring Max-Total A for bathing, dressing, and toileting at bed level. Able to participate in oral care and self feeding     Vision         Perception     Praxis      Pertinent Vitals/Pain Pain Assessment: Faces Faces Pain Scale: Hurts even more Pain Location: Movements of BLEs and LUE Pain Descriptors / Indicators: Discomfort;Moaning Pain Intervention(s): Monitored during  session;Limited activity within patient's tolerance;Repositioned     Hand Dominance Right   Extremity/Trunk Assessment Upper Extremity Assessment Upper Extremity Assessment: RUE deficits/detail;LUE deficits/detail RUE Deficits / Details: Decreased strength and ROM. Able to grasp cup with straw to bring to mouth. Maintaining grasp on tooth brush for oral care.  RUE Coordination: decreased fine motor;decreased gross motor LUE Deficits / Details: increased edema and pain with movement. Poor grasp strength and ROM.  LUE: Unable to fully assess due to pain LUE Coordination: decreased gross motor;decreased fine motor   Lower Extremity Assessment Lower Extremity Assessment:  Defer to PT evaluation RLE Deficits / Details: equino varus deform of ankle, moans to attmept flexing the leg, did not gain hip an dknee flexion LLE Deficits / Details: similar to right   Cervical / Trunk Assessment Cervical / Trunk Assessment: Other exceptions Cervical / Trunk Exceptions: increased body habitus   Communication Communication Communication: Expressive difficulties(Low tone and requiring increased time)   Cognition Arousal/Alertness: Awake/alert Behavior During Therapy: Flat affect Overall Cognitive Status: Difficult to assess Area of Impairment: Orientation;Following commands;Problem solving;Awareness                 Orientation Level: Situation     Following Commands: Follows one step commands with increased time;Follows one step commands inconsistently   Awareness: Intellectual Problem Solving: Slow processing;Requires verbal cues General Comments: Oriented that she was in the hospital. Able to provide minimal infomration about PLOF. Following simple cues with increased time.    General Comments  VSS throughout on RA.     Exercises Exercises: General Upper Extremity General Exercises - Upper Extremity Digit Composite Flexion: AROM;Left;10 reps;Supine Composite Extension: AROM;Left;10 reps;Supine   Shoulder Instructions      Home Living Family/patient expects to be discharged to:: Skilled nursing facility                                        Prior Functioning/Environment Level of Independence: Needs assistance  Gait / Transfers Assistance Needed: requires mechanical lift and assistance with ADL's.   Communication / Swallowing Assistance Needed: [facility]          OT Problem List: Decreased strength;Decreased range of motion;Decreased activity tolerance;Impaired balance (sitting and/or standing);Impaired UE functional use;Pain      OT Treatment/Interventions: Self-care/ADL training;Therapeutic exercise;Energy conservation;DME  and/or AE instruction;Therapeutic activities;Patient/family education    OT Goals(Current goals can be found in the care plan section) Acute Rehab OT Goals Patient Stated Goal: "brush my teeth" OT Goal Formulation: With patient Time For Goal Achievement: 05/26/19 Potential to Achieve Goals: Good  OT Frequency: Min 1X/week   Barriers to D/C:            Co-evaluation PT/OT/SLP Co-Evaluation/Treatment: Yes Reason for Co-Treatment: For patient/therapist safety;To address functional/ADL transfers;Complexity of the patient's impairments (multi-system involvement)   OT goals addressed during session: ADL's and self-care      AM-PAC OT "6 Clicks" Daily Activity     Outcome Measure Help from another person eating meals?: A Little Help from another person taking care of personal grooming?: A Lot Help from another person toileting, which includes using toliet, bedpan, or urinal?: Total Help from another person bathing (including washing, rinsing, drying)?: Total Help from another person to put on and taking off regular upper body clothing?: Total Help from another person to put on and taking off regular lower body clothing?: Total 6 Click Score: 9  End of Session Nurse Communication: Mobility status;Other (comment)(Wound on bottom)  Activity Tolerance: Patient tolerated treatment well Patient left: in bed;with call bell/phone within reach  OT Visit Diagnosis: Unsteadiness on feet (R26.81);Other abnormalities of gait and mobility (R26.89);Muscle weakness (generalized) (M62.81);Pain Pain - Right/Left: Left Pain - part of body: Arm;Hand                Time: [id number] OT Time Calculation (min): 38 min Charges:  OT General Charges $OT Visit: 1 Visit OT Evaluation $OT Eval Moderate Complexity: 1 Mod OT Treatments $Self Care/Home Management : 8-22 mins  Hyun Marsalis MSOT, OTR/L Acute Rehab Pager: [phone] Office: [place] 05/12/2019, 1:40 PM

## 2019-05-12 NOTE — Evaluation (Signed)
Physical Therapy Evaluation Patient Details Name: Shannon Garrett MRN: [id number] DOB: 02-15-1961 Today's Date: 05/12/2019   History of Present Illness  Shannon Garrett is a 58 y.o. female with past medical history of advanced multiple sclerosis, she is bedbound, HTN, cardiomyopathy and neurogenic bladder with recurrent UTIs.  She lives in SNF, she was sent to the hospital because of lethargy and hypotension, further work-up showed patient had sepsis, hypotension and AKI due to COVID-19 infection  Clinical Impression  The patient requires total care and mechanical lift for out of bed. Unfortunately, no skilled PT needs at this time.  Noted areas of skin breakdown in folds at top of posterior thighs.   PT will sign off.    Follow Up Recommendations No PT follow up(return to long term SNF)    Equipment Recommendations  None recommended by PT    Recommendations for Other Services       Precautions / Restrictions Precautions Precautions: Fall      Mobility  Bed Mobility Overal bed mobility: Needs Assistance Bed Mobility: Rolling Rolling: +2 for safety/equipment;+2 for physical assistance;Total assist         General bed mobility comments: patient did  reach for rail using right UE, able to reach over head and assist sliding up in the bed when head was down  Transfers                 General transfer comment: requires mechanical lift for OOB  Ambulation/Gait                Stairs            Wheelchair Mobility    Modified Rankin (Stroke Patients Only)       Balance                                             Pertinent Vitals/Pain Pain Assessment: Faces Faces Pain Scale: Hurts even more Pain Location: when moving legs and arms Pain Descriptors / Indicators: Discomfort;Moaning Pain Intervention(s): Monitored during session;Limited activity within patient's tolerance    Home Living Family/patient expects to be discharged to::  Skilled nursing facility                      Prior Function Level of Independence: Needs assistance   Gait / Transfers Assistance Needed: requires mechanical lift and assistance with ADL's.           Hand Dominance        Extremity/Trunk Assessment        Lower Extremity Assessment Lower Extremity Assessment: LLE deficits/detail;RLE deficits/detail RLE Deficits / Details: equino varus deform of ankle, moans to attmept flexing the leg, did not gain hip an dknee flexion LLE Deficits / Details: similar to right       Communication      Cognition Arousal/Alertness: Awake/alert Behavior During Therapy: Flat affect Overall Cognitive Status: Difficult to assess Area of Impairment: Orientation                 Orientation Level: Situation             General Comments: oriented to hospital, answers some questions but vague,      General Comments      Exercises     Assessment/Plan    PT Assessment Patent does not need any further PT services  PT  Problem List         PT Treatment Interventions      PT Goals (Current goals can be found in the Care Plan section)  Acute Rehab PT Goals Patient Stated Goal: to sit up, move around PT Goal Formulation: All assessment and education complete, DC therapy    Frequency     Barriers to discharge        Co-evaluation               AM-PAC PT "6 Clicks" Mobility  Outcome Measure Help needed turning from your back to your side while in a flat bed without using bedrails?: Total Help needed moving from lying on your back to sitting on the side of a flat bed without using bedrails?: Total Help needed moving to and from a bed to a chair (including a wheelchair)?: Total Help needed standing up from a chair using your arms (e.g., wheelchair or bedside chair)?: Total Help needed to walk in hospital room?: Total Help needed climbing 3-5 steps with a railing? : Total 6 Click Score: 6    End of  Session   Activity Tolerance: Patient limited by pain Patient left: in bed;with call bell/phone within reach Nurse Communication: Mobility status PT Visit Diagnosis: Other symptoms and signs involving the nervous system (R29.898)    Time: [id number] PT Time Calculation (min) (ACUTE ONLY): 43 min   Charges:   PT Evaluation $PT Eval Moderate Complexity: [place]  Office [phone]   Claretha Cooper 05/12/2019, 12:07 PM

## 2019-05-12 NOTE — Progress Notes (Signed)
  Speech Language Pathology Treatment: Dysphagia  Patient Details Name: Shannon Garrett MRN: [id number] DOB: 01-Apr-1961 Today's Date: 05/12/2019 Time: [id number] SLP Time Calculation (min) (ACUTE ONLY): 8 min  Assessment / Plan / Recommendation Clinical Impression  RN at bedside reports she has not observed any coughing related to drinking, thought the pt does have a baseline cough. Also observed by SLP l pt had three trials of consecutive straw sips of water with no observation of dysphagia and cough coughing. She did cough lightly x2 during session, but it did not appear related to drinking. She is enjoying her meals and reports soft texture is appropriate. Will sign off at this time.   HPI HPI: Pt is a 58 yo female admitted with hypotension and AMS after testing positive for COVID-19 one week prior. She has has several BSEs in the past (most recent;y in May 2017), all of which have recommended thin liquids. Dependent on her mentation, she has required intermittent use of softer diets. PMH: MS (bedbound), GERD, HTN, glaucoma, depression, cardiomyopathy, CHF, neurogenic bladder with recurrent UTIs      SLP Plan          Recommendations  Diet recommendations: Dysphagia 3 (mechanical soft);Thin liquid Liquids provided via: Straw;Cup Medication Administration: Whole meds with puree Supervision: Staff to assist with self feeding Compensations: Slow rate;Small sips/bites Postural Changes and/or Swallow Maneuvers: Seated upright 90 degrees                Oral Care Recommendations: Oral care BID Follow up Recommendations: Skilled Nursing facility SLP Visit Diagnosis: Dysphagia, unspecified (R13.10)       GO               Herbie Baltimore, MA CCC-SLP  Acute Rehabilitation Services Pager [phone] Office [phone]  Lynann Beaver 05/12/2019, 12:52 PM

## 2019-05-12 NOTE — Progress Notes (Signed)
Pharmacy Antibiotic Note  Shannon Garrett is a 58 y.o. female admitted on 05/10/2019 with UTI.  Pharmacy has been consulted for meropenem dosing given urinary symptoms coupled with urine culture growing ESBL Klebsiella.  Plan: -Start meropenem 1 gm IV q 8 hours  -Monitor CBC, renal fx and clinical progress   Height: 5\' 5"  (165.1 cm) Weight: 264 lb 8.8 oz (120 kg) IBW/kg (Calculated) : 57  Temp (24hrs), Avg:98 F (36.7 C), Min:97.8 F (36.6 C), Max:98.2 F (36.8 C)  Recent Labs  Lab 05/10/19 1452 05/10/19 1615 05/10/19 1705 05/10/19 1740 05/10/19 1956 05/11/19 0447 05/12/19 0150  WBC  --   --  7.9  --  10.7* 9.2 8.3  CREATININE  --  2.54*  --   --  1.95* 1.67* 1.62*  LATICACIDVEN 1.8  --   --  1.2  --   --   --     Estimated Creatinine Clearance: 50.3 mL/min (A) (by C-G formula based on SCr of 1.62 mg/dL (H)).    Allergies  Allergen Reactions  . Sulfonamide Derivatives Hives and Shortness Of Breath  . Penicillins Hives and Itching    Has patient had a PCN reaction causing immediate rash, facial/tongue/throat swelling, SOB or lightheadedness with hypotension: unknown Has patient had a PCN reaction causing severe rash involving mucus membranes or skin necrosis: unknown Has patient had a PCN reaction that required hospitalization: unknown Has patient had a PCN reaction occurring within the last 10 years: unknown If all of the above answers are "NO", then may proceed with Cephalosporin use. Prior course of rocephin charted 05/2015     Thank you for allowing pharmacy to be a part of this patient's care.  Albertina Parr, PharmD., BCPS Clinical Pharmacist Clinical phone for 05/12/19 until 5pm: [phone]

## 2019-05-12 NOTE — Progress Notes (Signed)
[facility] Outpatient Palliative  Referral received for palliative services once pt d/c's from the hospital.  Spoke with [name], sister and [facility], she confirms interest with anything that can help her sister.  She states she is not sure if she will d/c back to [place] or not as they are currently not happy with care.  She wants to look into placement at [facility]. Advised her I could update TOC manager so they can follow up.  Will plan to follow from a palliative outpatient where ever the discharge location turns out to be.  Venia Carbon RN, BSN, [facility] Liaison (in [place]) [phone]

## 2019-05-12 NOTE — Progress Notes (Signed)
Inpatient Diabetes Program Recommendations  AACE/ADA: New Consensus Statement on Inpatient Glycemic Control   Target Ranges:  Prepandial:   less than 140 mg/dL      Peak postprandial:   less than 180 mg/dL (1-2 hours)      Critically ill patients:  140 - 180 mg/dL  Results for CHRYSANTHEMUM, LECKIE (MRN [id number]) as of 05/12/2019 09:23  Ref. Range 05/11/2019 08:52 05/11/2019 11:27 05/11/2019 16:26 05/12/2019 07:45  Glucose-Capillary Latest Ref Range: 70 - 99 mg/dL 230 (H) 224 (H) 272 (H) 296 (H)    Review of Glycemic Control  Diabetes history: DM2 Outpatient Diabetes medications: None Current orders for Inpatient glycemic control: Novolog 0-15 units TID with meals  Inpatient Diabetes Program Recommendations:   Insulin-Correction: Please consider ordering Novolog 0-5 units QHS for bedtime correction.  Insulin-Basal: If steroids are continued, may want to consider ordering Levemir 10 units Q24H.   Thank you, Barnie Alderman, RN, MSN, CDE Diabetes Coordinator Inpatient Diabetes Program [phone] (Team Pager from 8am to 5pm)

## 2019-05-12 NOTE — Progress Notes (Signed)
PROGRESS NOTE                                                                                                                                                                                                             Patient Demographics:    Shannon Garrett, is a 58 y.o. female, DOB - May 03, 1961, PJP:[id number]  Outpatient Primary MD for the patient is Gildardo Cranker, DO    LOS - 2  Admit date - 05/10/2019    Chief Complaint  Patient presents with  . COVID       Brief Narrative   Shannon Garrett is a 58 y.o. female with past medical history of advanced multiple sclerosis, she is bedbound, HTN, cardiomyopathy and neurogenic bladder with recurrent UTIs.  She lives in SNF, she was sent to the hospital because of lethargy and hypotension, further work-up showed patient had sepsis, hypotension and AKI due to COVID-19 infection and she was sent to [facility] for further care.   Subjective:    Shannon Garrett today has, No headache, No chest pain, No abdominal pain ,she denies any dyspnea, but reports a mild cough.   Assessment  & Plan :   Sepsis secondary to ESBL UTI : -Reports urinary symptoms, has positive UA, urine culture growing ESBL, she is on cefepime, I will change to imipenem,. -Follow on urine cultures. -Pralcitonin significantly elevated, but is trending down, which is reassuring   COVID-19 for pneumonia -COVID-19 positive, checks x-ray significant for T opacity secondary to COVID-19 of pneumonia. -Patient on IV Solu-Medrol, will change to IV Decadron 6 mg IV daily. -We will start on IV remdesivir, but currently will hold given ALT more than 220. -Continue to trend inflammatory markers closely, RP would be significantly elevated as well in the setting of sepsis bedside COVID-19 will continue to monitor closely. -She is not a candidate for Actemra, given no hypoxia and given her sepsis, with elevated procalcitonin,  -Was  encouraged to use incentive spirometry and flutter valve  COVID-19 Labs  Recent Labs    05/10/19 1452 05/12/19 0150  DDIMER 1.92* 1.94*  FERRITIN 1,904*  --   LDH 1,183*  --   CRP 19.6* 18.2*    Lab Results  Component Value Date   SARSCOV2NAA POSITIVE (A) 05/10/2019    SpO2: 95 %  Hepatic Function Latest Ref Rng & Units  05/12/2019 05/11/2019 05/10/2019  Total Protein 6.5 - 8.1 g/dL 6.1(L) 6.1(L) 6.7  Albumin 3.5 - 5.0 g/dL 2.8(L) 2.7(L) 3.2(L)  AST 15 - 41 U/L 266(H) 492(H) 791(H)  ALT 0 - 44 U/L 234(H) 272(H) 325(H)  Alk Phosphatase 38 - 126 U/L 391(H) 395(H) 462(H)  Total Bilirubin 0.3 - 1.2 mg/dL 1.4(H) 2.4(H) 2.7(H)  Bilirubin, Direct 0.00 - 0.40 mg/dL - - -    Lab Results  Component Value Date   INR 1.0 05/12/2019   INR 1.1 05/10/2019   INR 1.1 05/10/2019       Component Value Date/Time   BNP 214.0 (H) 05/12/2019 0150    Advanced MS.  -  Bedbound SNF resident.  Supportive care.  AKI.   -Due to COVID-19 infection.  Has been hydrated.  Blood pressure remains soft, continue to hold diuretic for now.   Transaminitis.   -To COVID-19 infection, dehydration, continue to monitor closely .  GERD  - PPI.    Condition - Extremely Guarded  Family Communication  : Will update sister today  Code Status :  DNR  Diet :   Diet Order            DIET DYS 3 Room service appropriate? Yes; Fluid consistency: Thin  Diet effective now               Disposition Plan  :  SNF  Consults  :  None  Procedures  :     PUD Prophylaxis : PPI  DVT Prophylaxis  :   Heparin   Lab Results  Component Value Date   PLT 173 05/12/2019    Inpatient Medications  Scheduled Meds: . acidophilus  1 capsule Oral QODAY  . amantadine  100 mg Oral Daily  . B-complex with vitamin C  1 tablet Oral Daily  . buPROPion  150 mg Oral QPC breakfast  . gabapentin  400 mg Oral BID  . heparin  5,000 Units Subcutaneous Q8H  . insulin aspart  0-15 Units Subcutaneous TID WC  .  Melatonin  2 tablet Oral QHS  . methylPREDNISolone (SOLU-MEDROL) injection  60 mg Intravenous Q24H  . mirabegron ER  50 mg Oral QHS  . multivitamin with minerals  1 tablet Oral Daily  . pantoprazole  40 mg Oral Daily  . ascorbic acid  500 mg Oral BID  . zinc sulfate  220 mg Oral Daily   Continuous Infusions:  PRN Meds:.acetaminophen **OR** acetaminophen, acetaminophen, [DISCONTINUED] ondansetron **OR** ondansetron (ZOFRAN) IV, traMADol  Antibiotics  :    Anti-infectives (From admission, onward)   Start     Dose/Rate Route Frequency Ordered Stop   05/11/19 2000  vancomycin (VANCOCIN) IVPB 1000 mg/200 mL premix  Status:  Discontinued     1,000 mg 200 mL/hr over 60 Minutes Intravenous Every 36 hours 05/10/19 2057 05/10/19 2112   05/11/19 2000  vancomycin (VANCOCIN) 1,250 mg in sodium chloride 0.9 % 250 mL IVPB  Status:  Discontinued     1,250 mg 166.7 mL/hr over 90 Minutes Intravenous Every 36 hours 05/10/19 2112 05/11/19 1118   05/11/19 0600  ceFEPIme (MAXIPIME) 2 g in sodium chloride 0.9 % 100 mL IVPB  Status:  Discontinued     2 g 200 mL/hr over 30 Minutes Intravenous Every 12 hours 05/10/19 2057 05/12/19 0955   05/10/19 1445  vancomycin (VANCOCIN) 2,000 mg in sodium chloride 0.9 % 500 mL IVPB     2,000 mg 250 mL/hr over 120 Minutes Intravenous  Once 05/10/19 1438  05/10/19 2009   05/10/19 1445  ceFEPIme (MAXIPIME) 2 g in sodium chloride 0.9 % 100 mL IVPB     2 g 200 mL/hr over 30 Minutes Intravenous  Once 05/10/19 1442 05/10/19 1615   05/10/19 1415  aztreonam (AZACTAM) 2 g in sodium chloride 0.9 % 100 mL IVPB  Status:  Discontinued     2 g 200 mL/hr over 30 Minutes Intravenous  Once 05/10/19 1414 05/10/19 1440   05/10/19 1415  metroNIDAZOLE (FLAGYL) IVPB 500 mg     500 mg 100 mL/hr over 60 Minutes Intravenous  Once 05/10/19 1414 05/10/19 1615   05/10/19 1415  vancomycin (VANCOCIN) IVPB 1000 mg/200 mL premix  Status:  Discontinued     1,000 mg 200 mL/hr over 60 Minutes  Intravenous  Once 05/10/19 1414 05/10/19 1438        Gemayel Mascio M.D on 05/12/2019 at 12:16 PM  To page go to [email or web address] - password [facility]  [facility] -  Office  [phone]   See all Orders from today for further details    Objective:   Vitals:   05/12/19 0400 05/12/19 0746 05/12/19 1118 05/12/19 1200  BP: 137/90 137/79 95/62 134/69  Pulse: 83 89 90   Resp: '18 18 18   '$ Temp: 97.9 F (36.6 C) 98.1 F (36.7 C) 97.8 F (36.6 C)   TempSrc:  Oral Axillary   SpO2: 93% 92% 95%   Weight:      Height:        Wt Readings from Last 3 Encounters:  05/10/19 120 kg  04/21/18 119.5 kg  02/24/18 123.8 kg     Intake/Output Summary (Last 24 hours) at 05/12/2019 1216 Last data filed at 05/12/2019 1100 Gross per 24 hour  Intake 3085.59 ml  Output 1050 ml  Net 2035.59 ml     Physical Exam  Awake Alert, Oriented X 3, communicative, patient with significant diffuse generalized weakness in all extremities, but right upper extremity more ambulatory . symmetrical Chest wall movement, Good air movement bilaterally, CTAB RRR,No Gallops,Rubs or new Murmurs, No Parasternal Heave +ve B.Sounds, Abd Soft, No tenderness, No rebound - guarding or rigidity. No Cyanosis, Clubbing or edema, No new Rash or bruise     Data Review:    CBC Recent Labs  Lab 05/10/19 1705 05/10/19 1956 05/11/19 0447 05/12/19 0150  WBC 7.9 10.7* 9.2 8.3  HGB 11.6* 9.9* 9.9* 9.9*  HCT 35.1* 30.0* 30.5* 29.9*  PLT 145* 128* 144* 173  MCV 86.2 87.5 86.9 85.4  MCH 28.5 28.9 28.2 28.3  MCHC 33.0 33.0 32.5 33.1  RDW 15.1 15.2 15.3 15.4  LYMPHSABS 0.5*  --   --  0.5*  MONOABS 0.5  --   --  0.3  EOSABS 0.0  --   --  0.0  BASOSABS 0.0  --   --  0.0    Chemistries  Recent Labs  Lab 05/10/19 1615 05/10/19 1956 05/11/19 0447 05/12/19 0150  NA 131*  --  140 140  K 4.2  --  4.0 4.1  CL 95*  --  107 108  CO2 23  --  22 19*  GLUCOSE 342*  --  261* 347*  BUN 25*  --  24* 30*  CREATININE  2.54* 1.95* 1.67* 1.62*  CALCIUM 7.6*  --  7.3* 7.7*  MG  --   --   --  1.6*  AST 791*  --  492* 266*  ALT 325*  --  272* 234*  ALKPHOS 462*  --  395* 391*  BILITOT 2.7*  --  2.4* 1.4*   ------------------------------------------------------------------------------------------------------------------ Recent Labs    05/10/19 1452  TRIG 273*    Lab Results  Component Value Date   HGBA1C 7.6 (H) 05/10/2019   ------------------------------------------------------------------------------------------------------------------ Recent Labs    05/10/19 1956  TSH 2.526    Cardiac Enzymes No results for input(s): CKMB, TROPONINI, MYOGLOBIN in the last 168 hours.  Invalid input(s): CK ------------------------------------------------------------------------------------------------------------------    Component Value Date/Time   BNP 214.0 (H) 05/12/2019 0150    Micro Results Recent Results (from the past 240 hour(s))  Blood Culture (routine x 2)     Status: None (Preliminary result)   Collection Time: 05/10/19  2:52 PM   Specimen: BLOOD  Result Value Ref Range Status   Specimen Description   Final    BLOOD RIGHT FOREARM Performed at [facility] [address], [place], [place] [address]    Special Requests   Final    BOTTLES DRAWN AEROBIC AND ANAEROBIC Blood Culture results may not be optimal due to an excessive volume of blood received in culture bottles Performed at [facility] [address] [place], [place] [address]    Culture   Final    NO GROWTH 2 DAYS Performed at [facility] [address] [place], [place] [address]    Report Status PENDING  Incomplete  Blood Culture (routine x 2)     Status: None (Preliminary result)   Collection Time: 05/10/19  2:52 PM   Specimen: BLOOD  Result Value Ref Range Status   Specimen Description   Final    BLOOD RIGHT FOREARM Performed at [facility]  [address], [place], [place] [address]    Special Requests   Final    BOTTLES DRAWN AEROBIC AND ANAEROBIC Blood Culture results may not be optimal due to an excessive volume of blood received in culture bottles Performed at [facility] [address] [place], [place] [address]    Culture   Final    NO GROWTH 2 DAYS Performed at [facility] [address], [place], [place] [address]    Report Status PENDING  Incomplete  SARS CORONAVIRUS 2 (TAT 6-24 HRS) Nasopharyngeal Nasopharyngeal Swab     Status: Abnormal   Collection Time: 05/10/19  5:40 PM   Specimen: Nasopharyngeal Swab  Result Value Ref Range Status   SARS Coronavirus 2 POSITIVE (A) NEGATIVE Final    Comment: RESULT CALLED TO, READ BACK BY AND VERIFIED WITH: Helane Rima, RN AT [phone] ON 05/11/2019 BY SAINVILUS S (NOTE) SARS-CoV-2 target nucleic acids are DETECTED. The SARS-CoV-2 RNA is generally detectable in upper and lower respiratory specimens during the acute phase of infection. Positive results are indicative of active infection with SARS-CoV-2. Clinical  correlation with patient history and other diagnostic information is necessary to determine patient infection status. Positive results do  not rule out bacterial infection or co-infection with other viruses. The expected result is Negative. Fact Sheet for Patients: SugarRoll.be Fact Sheet for Healthcare Providers: https://www.woods-mathews.com/ This test is not yet approved or cleared by the [place] FDA and  has been authorized for detection and/or diagnosis of SARS-CoV-2 by FDA under an Emergency Use Authorization (EUA). This EUA will remain  in effect (meaning this test  can be used) for the duration of the COVID-19 declaration under Section 564(b)(1) of the Act, 21 U.S.C. section 360bbb-3(b)(1), unless the authorization is terminated or revoked sooner. Performed at  [facility],  [place] [address], [place], [place] [address]   Urine culture     Status: Abnormal   Collection Time: 05/10/19 11:50 PM   Specimen: In/Out Cath Urine  Result Value Ref Range Status   Specimen Description   Final    IN/OUT CATH URINE Performed at [facility] [address] [place], [place] [address]    Special Requests   Final    NONE Performed at [facility], [place] [address], [place], [place] [address]    Culture (A)  Final    40,000 COLONIES/mL KLEBSIELLA PNEUMONIAE Confirmed Extended Spectrum Beta-Lactamase Producer (ESBL).  In bloodstream infections from ESBL organisms, carbapenems are preferred over piperacillin/tazobactam. They are shown to have a lower risk of mortality.    Report Status 05/12/2019 FINAL  Final   Organism ID, Bacteria KLEBSIELLA PNEUMONIAE (A)  Final      Susceptibility   Klebsiella pneumoniae - MIC*    AMPICILLIN >=32 RESISTANT Resistant     CEFAZOLIN >=64 RESISTANT Resistant     CEFTRIAXONE >=64 RESISTANT Resistant     CIPROFLOXACIN >=4 RESISTANT Resistant     GENTAMICIN >=16 RESISTANT Resistant     IMIPENEM <=0.25 SENSITIVE Sensitive     NITROFURANTOIN 64 INTERMEDIATE Intermediate     TRIMETH/SULFA >=320 RESISTANT Resistant     AMPICILLIN/SULBACTAM >=32 RESISTANT Resistant     PIP/TAZO 16 SENSITIVE Sensitive     Extended ESBL POSITIVE Resistant     * 40,000 COLONIES/mL KLEBSIELLA PNEUMONIAE    Radiology Reports Dg Chest Port 1 View  Result Date: 05/10/2019 CLINICAL DATA:  Patient arrived from [place]. Patient facility called due to patient being more lethargic than normal. H/o Covid-19. Cardiomyopathy, Chronic Systolic Heart Failure, HTN, MS. Former smoker. EXAM: PORTABLE CHEST 1 VIEW COMPARISON:  Chest radiograph 11/07/2015 FINDINGS: Stable cardiomediastinal contours. There are subtle airspace opacities throughout the right lung. Small linear opacities at the left base likely reflect atelectasis. No  pneumothorax or large pleural effusion. No acute finding in the visualized skeleton. IMPRESSION: Asymmetric hazy airspace opacities throughout the right lung raise concern for infection versus asymmetric edema. Minimal linear opacities at the left base likely atelectasis. Electronically Signed   By: Audie Pinto M.D.   On: 05/10/2019 16:15

## 2019-05-13 DIAGNOSIS — Z66 Do not resuscitate: Secondary | ICD-10-CM

## 2019-05-13 DIAGNOSIS — Z515 Encounter for palliative care: Secondary | ICD-10-CM

## 2019-05-13 LAB — C-REACTIVE PROTEIN: CRP: 11.1 mg/dL — ABNORMAL HIGH (ref <1.0)

## 2019-05-13 LAB — CBC WITH DIFFERENTIAL/PLATELET
Abs Immature Granulocytes: 0.35 10*3/uL — ABNORMAL HIGH (ref 0.00–0.07)
Basophils Absolute: 0 10*3/uL (ref 0.0–0.1)
Basophils Relative: 0 %
Eosinophils Absolute: 0 10*3/uL (ref 0.0–0.5)
Eosinophils Relative: 0 %
HCT: 30.6 % — ABNORMAL LOW (ref 36.0–46.0)
Hemoglobin: 10.1 g/dL — ABNORMAL LOW (ref 12.0–15.0)
Immature Granulocytes: 4 %
Lymphocytes Relative: 4 %
Lymphs Abs: 0.4 10*3/uL — ABNORMAL LOW (ref 0.7–4.0)
MCH: 28.1 pg (ref 26.0–34.0)
MCHC: 33 g/dL (ref 30.0–36.0)
MCV: 85.2 fL (ref 80.0–100.0)
Monocytes Absolute: 0.5 10*3/uL (ref 0.1–1.0)
Monocytes Relative: 5 %
Neutro Abs: 8.8 10*3/uL — ABNORMAL HIGH (ref 1.7–7.7)
Neutrophils Relative %: 87 %
Platelets: 224 10*3/uL (ref 150–400)
RBC: 3.59 MIL/uL — ABNORMAL LOW (ref 3.87–5.11)
RDW: 15.6 % — ABNORMAL HIGH (ref 11.5–15.5)
WBC: 10.1 10*3/uL (ref 4.0–10.5)
nRBC: 0.2 % (ref 0.0–0.2)

## 2019-05-13 LAB — COMPREHENSIVE METABOLIC PANEL
ALT: 166 U/L — ABNORMAL HIGH (ref 0–44)
AST: 95 U/L — ABNORMAL HIGH (ref 15–41)
Albumin: 2.5 g/dL — ABNORMAL LOW (ref 3.5–5.0)
Alkaline Phosphatase: 358 U/L — ABNORMAL HIGH (ref 38–126)
Anion gap: 13 (ref 5–15)
BUN: 30 mg/dL — ABNORMAL HIGH (ref 6–20)
CO2: 18 mmol/L — ABNORMAL LOW (ref 22–32)
Calcium: 8.6 mg/dL — ABNORMAL LOW (ref 8.9–10.3)
Chloride: 109 mmol/L (ref 98–111)
Creatinine, Ser: 1.38 mg/dL — ABNORMAL HIGH (ref 0.44–1.00)
GFR calc Af Amer: 49 mL/min — ABNORMAL LOW (ref >60)
GFR calc non Af Amer: 43 mL/min — ABNORMAL LOW (ref >60)
Glucose, Bld: 245 mg/dL — ABNORMAL HIGH (ref 70–99)
Potassium: 4 mmol/L (ref 3.5–5.1)
Sodium: 140 mmol/L (ref 135–145)
Total Bilirubin: 0.8 mg/dL (ref 0.3–1.2)
Total Protein: 6 g/dL — ABNORMAL LOW (ref 6.5–8.1)

## 2019-05-13 LAB — PROCALCITONIN: Procalcitonin: 29.35 ng/mL

## 2019-05-13 LAB — D-DIMER, QUANTITATIVE: D-Dimer, Quant: 1.35 ug/mL-FEU — ABNORMAL HIGH (ref 0.00–0.50)

## 2019-05-13 LAB — GLUCOSE, CAPILLARY
Glucose-Capillary: 260 mg/dL — ABNORMAL HIGH (ref 70–99)
Glucose-Capillary: 229 mg/dL — ABNORMAL HIGH (ref 70–99)
Glucose-Capillary: 268 mg/dL — ABNORMAL HIGH (ref 70–99)
Glucose-Capillary: 255 mg/dL — ABNORMAL HIGH (ref 70–99)
Glucose-Capillary: 166 mg/dL — ABNORMAL HIGH (ref 70–99)

## 2019-05-13 LAB — BRAIN NATRIURETIC PEPTIDE: B Natriuretic Peptide: 638 pg/mL — ABNORMAL HIGH (ref 0.0–100.0)

## 2019-05-13 LAB — MAGNESIUM: Magnesium: 1.8 mg/dL (ref 1.7–2.4)

## 2019-05-13 MED ORDER — SODIUM CHLORIDE 0.9 % IV SOLN
200.0000 mg | Freq: Once | INTRAVENOUS | Status: AC
  Administered 2019-05-13: 200 mg via INTRAVENOUS
  Filled 2019-05-13: qty 40

## 2019-05-13 MED ORDER — MAGNESIUM SULFATE 2 GM/50ML IV SOLN
2.0000 g | Freq: Once | INTRAVENOUS | Status: AC
  Administered 2019-05-13: 2 g via INTRAVENOUS
  Filled 2019-05-13: qty 50

## 2019-05-13 MED ORDER — SODIUM CHLORIDE 0.9 % IV SOLN
100.0000 mg | INTRAVENOUS | Status: DC
  Administered 2019-05-14 – 2019-05-17 (×4): 100 mg via INTRAVENOUS
  Filled 2019-05-13: qty 20
  Filled 2019-05-13 (×3): qty 100

## 2019-05-13 MED ORDER — GUAIFENESIN-DM 100-10 MG/5ML PO SYRP
5.0000 mL | ORAL_SOLUTION | ORAL | Status: DC | PRN
  Administered 2019-05-13 – 2019-05-17 (×5): 5 mL via ORAL
  Filled 2019-05-13 (×6): qty 10

## 2019-05-13 NOTE — Progress Notes (Signed)
Pt sister is called and updated, all questions answered.

## 2019-05-13 NOTE — Progress Notes (Signed)
Inpatient Diabetes Program Recommendations  AACE/ADA: New Consensus Statement on Inpatient Glycemic Control   Target Ranges:  Prepandial:   less than 140 mg/dL      Peak postprandial:   less than 180 mg/dL (1-2 hours)      Critically ill patients:  140 - 180 mg/dL   Results for PIERRA, BLANKINSHIP (MRN [id number]) as of 05/13/2019 12:28  Ref. Range 05/12/2019 07:45 05/12/2019 11:16 05/12/2019 16:46 05/12/2019 21:09 05/13/2019 07:18 05/13/2019 11:13  Glucose-Capillary Latest Ref Range: 70 - 99 mg/dL 296 (H) 285 (H) 260 (H) 277 (H) 229 (H) 268 (H)   Review of Glycemic Control  Diabetes history: DM2 Outpatient Diabetes medications: None Current orders for Inpatient glycemic control: Novolog 0-15 units TID with meals; Decadron 6 mg daily  Inpatient Diabetes Program Recommendations:   Insulin-Correction: Please consider ordering Novolog 0-5 units QHS for bedtime correction.  Insulin-Basal: If steroids are continued, may want to consider ordering Levemir 12 units Q24H.   Thanks, Barnie Alderman, RN, MSN, CDE Diabetes Coordinator Inpatient Diabetes Program [phone] (Team Pager from 8am to 5pm)

## 2019-05-13 NOTE — Progress Notes (Addendum)
PROGRESS NOTE                                                                                                                                                                                                             Patient Demographics:    Shannon Garrett, is a 58 y.o. female, DOB - October 10, 1960, [id number]  Outpatient Primary MD for the patient is Gildardo Cranker, DO   Admit date - 05/10/2019   LOS - 3  Chief Complaint  Patient presents with  . COVID       Brief Narrative: Patient is a 58 y.o. female with PMHx of multiple sclerosis (bed to wheelchair bound) HTN, neurogenic bladder with history of recurrent UTIs-resident of SNF-presented to the hospital with lethargy, hypotension-subsequently thought to have sepsis with AKI secondary to Covid 19 pneumonia and Klebsiella ESBL UTI.  See below for further details.   Subjective:    Shannon Garrett today was lying comfortably in bed-she was eating breakfast.  Claims that her cough has worsened somewhat-she feels little short of breath at times-but O2 saturation remains in the mid 90s on room air.   Assessment  & Plan :   Sepsis secondary to ESBL Klebsiella complicated UTI and [id number] pneumonia: Sepsis physiology has resolved-on meropenem.  Starting remdesivir today-as ALT is now less than 250.  Continue Decadron for a few days-inflammatory markers are downtrending.  If she continues to improve and remains on room air-suspect steroids could be discontinued soon.  Fever: afebrile  O2 requirements: On RA   COVID-19 Labs: Recent Labs    05/10/19 1452 05/12/19 0150 05/13/19 0200  DDIMER 1.92* 1.94* 1.35*  FERRITIN 1,904*  --   --   LDH 1,183*  --   --   CRP 19.6* 18.2* 11.1*    Lab Results  Component Value Date   SARSCOV2NAA POSITIVE (A) 05/10/2019     COVID-19 Medications: Steroids:11/8>> Remdesivir:11/8>> Actemra: Not given-probably contraindicated due to  sepsis from ESBL Klebsiella UTI. Convalescent Plasma: Not indicated given relative clinical stability from COVID-19. Research Studies:N/A  Other medications: Diuretics:Euvolemic-no need for lasix Antibiotics: See below  Prone/Incentive Spirometry: encouraged incentive spirometry use 3-4/hour  DVT Prophylaxis  :   Heparin   Transaminitis: Secondary to COVID-19-slowly improving-continue to follow closely while on remdesivir.  Multiple sclerosis: Appears advanced-only able to move her right upper extremity-rest of  extremities are very weak from MS.  She is bed to wheelchair bound-and has not ambulated any years.  GERD: Continue PPI  Insomnia/fatigue/chronic pain syndrome: Suspect all related to MS-continue amantadine, Neurontin  Obesity: Estimated body mass index is 44.02 kg/m as calculated from the following:   Height as of this encounter: 5\' 5"  (1.651 m).   Weight as of this encounter: 120 kg.   ABG:    Component Value Date/Time   PHART 7.420 07/10/2015 1801   PCO2ART 41.2 07/10/2015 1801   PO2ART 417.0 (H) 07/10/2015 1801   HCO3 26.7 (H) 07/10/2015 1801   TCO2 28 07/10/2015 1801   O2SAT 100.0 07/10/2015 1801    Vent Settings: N/A   Condition - Extremely Guarded  Family Communication  : Unable to leave a voicemail for sister-as mailbox full.  Will attempt to call back later.  Code Status :  DNR  Diet :  Diet Order            DIET DYS 3 Room service appropriate? Yes; Fluid consistency: Thin  Diet effective now               Disposition Plan  :  Remain hospitalized  Barriers to discharge: complete 5 days of IV Remdesivir  Consults  : Palliative care  Procedures  :  None  Antibiotics  :    Anti-infectives (From admission, onward)   Start     Dose/Rate Route Frequency Ordered Stop   05/14/19 1000  remdesivir 100 mg in sodium chloride 0.9 % 250 mL IVPB     100 mg 500 mL/hr over 30 Minutes Intravenous Every 24 hours 05/13/19 0815 05/18/19 0959   05/13/19  1000  remdesivir 200 mg in sodium chloride 0.9 % 250 mL IVPB     200 mg 500 mL/hr over 30 Minutes Intravenous Once 05/13/19 0815 05/13/19 1108   05/12/19 1330  meropenem (MERREM) 1 g in sodium chloride 0.9 % 100 mL IVPB     1 g 200 mL/hr over 30 Minutes Intravenous Every 8 hours 05/12/19 1243     05/11/19 2000  vancomycin (VANCOCIN) IVPB 1000 mg/200 mL premix  Status:  Discontinued     1,000 mg 200 mL/hr over 60 Minutes Intravenous Every 36 hours 05/10/19 2057 05/10/19 2112   05/11/19 2000  vancomycin (VANCOCIN) 1,250 mg in sodium chloride 0.9 % 250 mL IVPB  Status:  Discontinued     1,250 mg 166.7 mL/hr over 90 Minutes Intravenous Every 36 hours 05/10/19 2112 05/11/19 1118   05/11/19 0600  ceFEPIme (MAXIPIME) 2 g in sodium chloride 0.9 % 100 mL IVPB  Status:  Discontinued     2 g 200 mL/hr over 30 Minutes Intravenous Every 12 hours 05/10/19 2057 05/12/19 0955   05/10/19 1445  vancomycin (VANCOCIN) 2,000 mg in sodium chloride 0.9 % 500 mL IVPB     2,000 mg 250 mL/hr over 120 Minutes Intravenous  Once 05/10/19 1438 05/10/19 2009   05/10/19 1445  ceFEPIme (MAXIPIME) 2 g in sodium chloride 0.9 % 100 mL IVPB     2 g 200 mL/hr over 30 Minutes Intravenous  Once 05/10/19 1442 05/10/19 1615   05/10/19 1415  aztreonam (AZACTAM) 2 g in sodium chloride 0.9 % 100 mL IVPB  Status:  Discontinued     2 g 200 mL/hr over 30 Minutes Intravenous  Once 05/10/19 1414 05/10/19 1440   05/10/19 1415  metroNIDAZOLE (FLAGYL) IVPB 500 mg     500 mg 100 mL/hr over 60 Minutes  Intravenous  Once 05/10/19 1414 05/10/19 1615   05/10/19 1415  vancomycin (VANCOCIN) IVPB 1000 mg/200 mL premix  Status:  Discontinued     1,000 mg 200 mL/hr over 60 Minutes Intravenous  Once 05/10/19 1414 05/10/19 1438      Inpatient Medications  Scheduled Meds: . acidophilus  1 capsule Oral QODAY  . amantadine  100 mg Oral Daily  . B-complex with vitamin C  1 tablet Oral Daily  . buPROPion  150 mg Oral QPC breakfast  .  dexamethasone (DECADRON) injection  6 mg Intravenous Daily  . gabapentin  400 mg Oral BID  . heparin  5,000 Units Subcutaneous Q8H  . insulin aspart  0-15 Units Subcutaneous TID WC  . Melatonin  2 tablet Oral QHS  . mirabegron ER  50 mg Oral QHS  . multivitamin with minerals  1 tablet Oral Daily  . pantoprazole  40 mg Oral Daily  . ascorbic acid  500 mg Oral BID  . zinc sulfate  220 mg Oral Daily   Continuous Infusions: . meropenem (MERREM) IV 1 g (05/13/19 1331)  . [START ON 05/14/2019] remdesivir 100 mg in NS 250 mL     PRN Meds:.acetaminophen **OR** acetaminophen, acetaminophen, [DISCONTINUED] ondansetron **OR** ondansetron (ZOFRAN) IV, traMADol, zolpidem   Time Spent in minutes  25  See all Orders from today for further details   Oren Binet M.D on 05/13/2019 at 1:37 PM  To page go to [email or web address] - use universal password  [facility] -  Office  [phone]    Objective:   Vitals:   05/12/19 1604 05/12/19 1911 05/13/19 0400 05/13/19 0722  BP: 132/79 136/71 (!) 142/78 (!) 125/45  Pulse: 91 87 84 100  Resp: 18 18 18 20   Temp: 97.8 F (36.6 C) 98.2 F (36.8 C) 97.9 F (36.6 C) 97.9 F (36.6 C)  TempSrc: Axillary   Oral  SpO2: 97% 99% 97% 95%  Weight:      Height:        Wt Readings from Last 3 Encounters:  05/10/19 120 kg  04/21/18 119.5 kg  02/24/18 123.8 kg     Intake/Output Summary (Last 24 hours) at 05/13/2019 1337 Last data filed at 05/13/2019 0800 Gross per 24 hour  Intake 1160 ml  Output 600 ml  Net 560 ml     Physical Exam Gen Exam:Alert awake- HEENT:atraumatic, normocephalic Chest: B/L clear to auscultation anteriorly CVS:S1S2 regular Abdomen:soft non tender, non distended Extremities:no edema 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 Skin: no rash   Data Review:    CBC Recent Labs  Lab 05/10/19 1705 05/10/19 1956 05/11/19 0447 05/12/19 0150 05/13/19 0200  WBC 7.9 10.7* 9.2 8.3 10.1   HGB 11.6* 9.9* 9.9* 9.9* 10.1*  HCT 35.1* 30.0* 30.5* 29.9* 30.6*  PLT 145* 128* 144* 173 224  MCV 86.2 87.5 86.9 85.4 85.2  MCH 28.5 28.9 28.2 28.3 28.1  MCHC 33.0 33.0 32.5 33.1 33.0  RDW 15.1 15.2 15.3 15.4 15.6*  LYMPHSABS 0.5*  --   --  0.5* 0.4*  MONOABS 0.5  --   --  0.3 0.5  EOSABS 0.0  --   --  0.0 0.0  BASOSABS 0.0  --   --  0.0 0.0    Chemistries  Recent Labs  Lab 05/10/19 1615 05/10/19 1956 05/11/19 0447 05/12/19 0150 05/13/19 0200  NA 131*  --  140 140 140  K 4.2  --  4.0 4.1 4.0  CL 95*  --  107 108 109  CO2 23  --  22 19* 18*  GLUCOSE 342*  --  261* 347* 245*  BUN 25*  --  24* 30* 30*  CREATININE 2.54* 1.95* 1.67* 1.62* 1.38*  CALCIUM 7.6*  --  7.3* 7.7* 8.6*  MG  --   --   --  1.6* 1.8  AST 791*  --  492* 266* 95*  ALT 325*  --  272* 234* 166*  ALKPHOS 462*  --  395* 391* 358*  BILITOT 2.7*  --  2.4* 1.4* 0.8   ------------------------------------------------------------------------------------------------------------------ Recent Labs    05/10/19 1452  TRIG 273*    Lab Results  Component Value Date   HGBA1C 7.6 (H) 05/10/2019   ------------------------------------------------------------------------------------------------------------------ Recent Labs    05/10/19 1956  TSH 2.526   ------------------------------------------------------------------------------------------------------------------ Recent Labs    05/10/19 1452  FERRITIN 1,904*    Coagulation profile Recent Labs  Lab 05/10/19 1452 05/10/19 1956 05/12/19 0150  INR 1.1 1.1 1.0    Recent Labs    05/12/19 0150 05/13/19 0200  DDIMER 1.94* 1.35*    Cardiac Enzymes No results for input(s): CKMB, TROPONINI, MYOGLOBIN in the last 168 hours.  Invalid input(s): CK ------------------------------------------------------------------------------------------------------------------    Component Value Date/Time   BNP 638.0 (H) 05/13/2019 0200    Micro Results Recent  Results (from the past 240 hour(s))  Blood Culture (routine x 2)     Status: None (Preliminary result)   Collection Time: 05/10/19  2:52 PM   Specimen: BLOOD  Result Value Ref Range Status   Specimen Description   Final    BLOOD RIGHT FOREARM Performed at [facility], [place] [address] [place], [place] [address]    Special Requests   Final    BOTTLES DRAWN AEROBIC AND ANAEROBIC Blood Culture results may not be optimal due to an excessive volume of blood received in culture bottles Performed at [facility] [address], [place], [place] [address]    Culture   Final    NO GROWTH 3 DAYS Performed at [facility], [place] [address] [place], [place] [address]    Report Status PENDING  Incomplete  Blood Culture (routine x 2)     Status: None (Preliminary result)   Collection Time: 05/10/19  2:52 PM   Specimen: BLOOD  Result Value Ref Range Status   Specimen Description   Final    BLOOD RIGHT FOREARM Performed at [facility] [address], [place], [place] [address]    Special Requests   Final    BOTTLES DRAWN AEROBIC AND ANAEROBIC Blood Culture results may not be optimal due to an excessive volume of blood received in culture bottles Performed at [facility] [address] [place], [place] [address]    Culture   Final    NO GROWTH 3 DAYS Performed at [facility], [place] [address] [place], [place] [address]    Report Status PENDING  Incomplete  SARS CORONAVIRUS 2 (TAT 6-24 HRS) Nasopharyngeal Nasopharyngeal Swab     Status: Abnormal   Collection Time: 05/10/19  5:40 PM   Specimen: Nasopharyngeal Swab  Result Value Ref Range Status   SARS Coronavirus 2 POSITIVE (A) NEGATIVE Final    Comment: RESULT CALLED TO, READ BACK BY AND VERIFIED WITH: Helane Rima, RN AT [phone] ON 05/11/2019 BY SAINVILUS S (NOTE) SARS-CoV-2 target nucleic acids are DETECTED. The SARS-CoV-2 RNA is generally detectable  in upper and lower respiratory specimens  during the acute phase of infection. Positive results are indicative of active infection with SARS-CoV-2. Clinical  correlation with patient history and other diagnostic information is necessary to determine patient infection status. Positive results do  not rule out bacterial infection or co-infection with other viruses. The expected result is Negative. Fact Sheet for Patients: SugarRoll.be Fact Sheet for Healthcare Providers: https://www.woods-mathews.com/ This test is not yet approved or cleared by the [place] FDA and  has been authorized for detection and/or diagnosis of SARS-CoV-2 by FDA under an Emergency Use Authorization (EUA). This EUA will remain  in effect (meaning this test  can be used) for the duration of the COVID-19 declaration under Section 564(b)(1) of the Act, 21 U.S.C. section 360bbb-3(b)(1), unless the authorization is terminated or revoked sooner. Performed at [facility], [place] [address], [place], [place] [address]   Urine culture     Status: Abnormal   Collection Time: 05/10/19 11:50 PM   Specimen: In/Out Cath Urine  Result Value Ref Range Status   Specimen Description   Final    IN/OUT CATH URINE Performed at [facility] [address] [place], [place] [address]    Special Requests   Final    NONE Performed at [facility], [place] [address], [place], [place] [address]    Culture (A)  Final    40,000 COLONIES/mL KLEBSIELLA PNEUMONIAE Confirmed Extended Spectrum Beta-Lactamase Producer (ESBL).  In bloodstream infections from ESBL organisms, carbapenems are preferred over piperacillin/tazobactam. They are shown to have a lower risk of mortality.    Report Status 05/12/2019 FINAL  Final   Organism ID, Bacteria KLEBSIELLA PNEUMONIAE (A)  Final      Susceptibility   Klebsiella pneumoniae - MIC*    AMPICILLIN >=32 RESISTANT  Resistant     CEFAZOLIN >=64 RESISTANT Resistant     CEFTRIAXONE >=64 RESISTANT Resistant     CIPROFLOXACIN >=4 RESISTANT Resistant     GENTAMICIN >=16 RESISTANT Resistant     IMIPENEM <=0.25 SENSITIVE Sensitive     NITROFURANTOIN 64 INTERMEDIATE Intermediate     TRIMETH/SULFA >=320 RESISTANT Resistant     AMPICILLIN/SULBACTAM >=32 RESISTANT Resistant     PIP/TAZO 16 SENSITIVE Sensitive     Extended ESBL POSITIVE Resistant     * 40,000 COLONIES/mL KLEBSIELLA PNEUMONIAE    Radiology Reports Dg Chest Port 1 View  Result Date: 05/10/2019 CLINICAL DATA:  Patient arrived from [place]. Patient facility called due to patient being more lethargic than normal. H/o Covid-19. Cardiomyopathy, Chronic Systolic Heart Failure, HTN, MS. Former smoker. EXAM: PORTABLE CHEST 1 VIEW COMPARISON:  Chest radiograph 11/07/2015 FINDINGS: Stable cardiomediastinal contours. There are subtle airspace opacities throughout the right lung. Small linear opacities at the left base likely reflect atelectasis. No pneumothorax or large pleural effusion. No acute finding in the visualized skeleton. IMPRESSION: Asymmetric hazy airspace opacities throughout the right lung raise concern for infection versus asymmetric edema. Minimal linear opacities at the left base likely atelectasis. Electronically Signed   By: Audie Pinto M.D.   On: 05/10/2019 16:15

## 2019-05-13 NOTE — Progress Notes (Signed)
Pharmacy Brief Note  O:  ALT: 166 CXR: 11/8 consistent with infection vs edema SpO2: 90s % on RA  A/P:  ALT remains elevated but now below 220. Will initiate remdesivir 200 mg iv once followed by 100 mg iv daily x 4 days. Will follow ALT closely.   Napoleon Form, [facility] 05/13/19 1:33 PM

## 2019-05-14 LAB — COMPREHENSIVE METABOLIC PANEL
ALT: 123 U/L — ABNORMAL HIGH (ref 0–44)
AST: 66 U/L — ABNORMAL HIGH (ref 15–41)
Albumin: 2.7 g/dL — ABNORMAL LOW (ref 3.5–5.0)
Alkaline Phosphatase: 330 U/L — ABNORMAL HIGH (ref 38–126)
Anion gap: 11 (ref 5–15)
BUN: 27 mg/dL — ABNORMAL HIGH (ref 6–20)
CO2: 19 mmol/L — ABNORMAL LOW (ref 22–32)
Calcium: 8.9 mg/dL (ref 8.9–10.3)
Chloride: 107 mmol/L (ref 98–111)
Creatinine, Ser: 1.23 mg/dL — ABNORMAL HIGH (ref 0.44–1.00)
GFR calc Af Amer: 57 mL/min — ABNORMAL LOW (ref >60)
GFR calc non Af Amer: 49 mL/min — ABNORMAL LOW (ref >60)
Glucose, Bld: 100 mg/dL — ABNORMAL HIGH (ref 70–99)
Potassium: 4.3 mmol/L (ref 3.5–5.1)
Sodium: 137 mmol/L (ref 135–145)
Total Bilirubin: 1.2 mg/dL (ref 0.3–1.2)
Total Protein: 6.1 g/dL — ABNORMAL LOW (ref 6.5–8.1)

## 2019-05-14 LAB — CBC WITH DIFFERENTIAL/PLATELET
Abs Immature Granulocytes: 0.98 10*3/uL — ABNORMAL HIGH (ref 0.00–0.07)
Basophils Absolute: 0.2 10*3/uL — ABNORMAL HIGH (ref 0.0–0.1)
Basophils Relative: 1 %
Eosinophils Absolute: 0 10*3/uL (ref 0.0–0.5)
Eosinophils Relative: 0 %
HCT: 32.7 % — ABNORMAL LOW (ref 36.0–46.0)
Hemoglobin: 11.1 g/dL — ABNORMAL LOW (ref 12.0–15.0)
Immature Granulocytes: 9 %
Lymphocytes Relative: 8 %
Lymphs Abs: 0.8 10*3/uL (ref 0.7–4.0)
MCH: 28.2 pg (ref 26.0–34.0)
MCHC: 33.9 g/dL (ref 30.0–36.0)
MCV: 83.2 fL (ref 80.0–100.0)
Monocytes Absolute: 0.4 10*3/uL (ref 0.1–1.0)
Monocytes Relative: 4 %
Neutro Abs: 8.3 10*3/uL — ABNORMAL HIGH (ref 1.7–7.7)
Neutrophils Relative %: 78 %
Platelets: 237 10*3/uL (ref 150–400)
RBC: 3.93 MIL/uL (ref 3.87–5.11)
RDW: 15.1 % (ref 11.5–15.5)
WBC: 10.7 10*3/uL — ABNORMAL HIGH (ref 4.0–10.5)
nRBC: 2.1 % — ABNORMAL HIGH (ref 0.0–0.2)

## 2019-05-14 LAB — GLUCOSE, CAPILLARY
Glucose-Capillary: 86 mg/dL (ref 70–99)
Glucose-Capillary: 158 mg/dL — ABNORMAL HIGH (ref 70–99)
Glucose-Capillary: 256 mg/dL — ABNORMAL HIGH (ref 70–99)
Glucose-Capillary: 176 mg/dL — ABNORMAL HIGH (ref 70–99)

## 2019-05-14 LAB — MAGNESIUM: Magnesium: 2.6 mg/dL — ABNORMAL HIGH (ref 1.7–2.4)

## 2019-05-14 LAB — C-REACTIVE PROTEIN: CRP: 14.7 mg/dL — ABNORMAL HIGH (ref <1.0)

## 2019-05-14 LAB — D-DIMER, QUANTITATIVE: D-Dimer, Quant: 1.43 ug/mL-FEU — ABNORMAL HIGH (ref 0.00–0.50)

## 2019-05-14 LAB — BRAIN NATRIURETIC PEPTIDE: B Natriuretic Peptide: 697 pg/mL — ABNORMAL HIGH (ref 0.0–100.0)

## 2019-05-14 MED ORDER — FUROSEMIDE 10 MG/ML IJ SOLN
40.0000 mg | Freq: Once | INTRAMUSCULAR | Status: AC
  Administered 2019-05-14: 40 mg via INTRAVENOUS
  Filled 2019-05-14: qty 4

## 2019-05-14 NOTE — Progress Notes (Signed)
Patient transferred to bariatric bed. Encouragment and education provided on IS and flutter valve use. Patient is having some difficultly clearing secretions.

## 2019-05-14 NOTE — Progress Notes (Signed)
PROGRESS NOTE                                                                                                                                                                                                             Patient Demographics:    Shannon Garrett, is a 58 y.o. female, DOB - 1961/05/06, [id number]  Outpatient Primary MD for the patient is Gildardo Cranker, DO   Admit date - 05/10/2019   LOS - 4  Chief Complaint  Patient presents with  . COVID       Brief Narrative: Patient is a 58 y.o. female with PMHx of multiple sclerosis (bed to wheelchair bound) HTN, neurogenic bladder with history of recurrent UTIs-resident of SNF-presented to the hospital with lethargy, hypotension-subsequently thought to have sepsis with AKI secondary to Covid 19 pneumonia and Klebsiella ESBL UTI.  See below for further details.   Subjective:    Deborah Kisner today was lying comfortably in bed-she claims that her cough has improved.  Per RN-she briefly desaturated at night and required 2 L of oxygen.   Assessment  & Plan :   Sepsis secondary to ESBL Klebsiella complicated UTI and [id number] pneumonia: Sepsis physiology has resolved-remains on meropenem (day 3/7) for ESBL Klebsiella-and Remdesivir/steroids for COVID-19 pneumonia.  CRP still significantly elevated-continue to monitor call cautiously.   Fever: afebrile  O2 requirements: On RA   COVID-19 Labs: Recent Labs    05/12/19 0150 05/13/19 0200 05/14/19 0210 05/14/19 0512  DDIMER 1.94* 1.35*  --  1.43*  CRP 18.2* 11.1* 14.7*  --     Lab Results  Component Value Date   SARSCOV2NAA POSITIVE (A) 05/10/2019     COVID-19 Medications: Steroids:11/8>> Remdesivir:11/11>> Actemra: Not given-probably contraindicated due to sepsis from ESBL Klebsiella UTI. Convalescent Plasma: Not indicated given relative clinical stability from COVID-19. Research Studies:N/A  Other  medications: Diuretics: Appears relatively euvolemic-but due to worsening hypoxemia-elevated BNP-we will give 1 dose of IV Lasix and follow.  Attempt to keep in negative balance. Antibiotics: See below  Prone/Incentive Spirometry: encouraged incentive spirometry use 3-4/hour  DVT Prophylaxis  :   Heparin   Mild acute hypoxic respiratory failure: Secondary to COVID-19-only on 2 L of oxygen this morning-continue supportive care.  She seems to have worsened slightly overnight-we will continue to monitor closely-she is not a candidate for aggressive care-I spoke  with her sister over the phone this morning-if she deteriorates significantly-we will looking at hospice.  Transaminitis: Secondary to COVID-19-slowly improving-once ALT was consistently below 250-remdesivir was started on 11/11.  LFTs continue to downtrend-follow periodically.  Multiple sclerosis: Appears advanced-only able to move her right upper extremity-rest of extremities are very weak from MS.  She is bed to wheelchair bound-and has not ambulated any years.  GERD: Continue PPI  Insomnia/fatigue/chronic pain syndrome: Suspect all related to MS-continue amantadine, Neurontin  Obesity: Estimated body mass index is 44.02 kg/m as calculated from the following:   Height as of this encounter: 5\' 5"  (1.651 m).   Weight as of this encounter: 120 kg.   ABG:    Component Value Date/Time   PHART 7.420 07/10/2015 1801   PCO2ART 41.2 07/10/2015 1801   PO2ART 417.0 (H) 07/10/2015 1801   HCO3 26.7 (H) 07/10/2015 1801   TCO2 28 07/10/2015 1801   O2SAT 100.0 07/10/2015 1801    Vent Settings: N/A   Condition - Extremely Guarded  Family Communication : Spoke with patient's sister over the phone on 11/12  Code Status :  DNR  Diet :  Diet Order            DIET DYS 3 Room service appropriate? Yes; Fluid consistency: Thin  Diet effective now               Disposition Plan  :  Remain hospitalized  Barriers to discharge:  complete 5 days of IV Remdesivir  Consults  : Palliative care  Procedures  :  None  Antibiotics  :    Anti-infectives (From admission, onward)   Start     Dose/Rate Route Frequency Ordered Stop   05/14/19 1000  remdesivir 100 mg in sodium chloride 0.9 % 250 mL IVPB     100 mg 500 mL/hr over 30 Minutes Intravenous Every 24 hours 05/13/19 0815 05/18/19 0959   05/13/19 1000  remdesivir 200 mg in sodium chloride 0.9 % 250 mL IVPB     200 mg 500 mL/hr over 30 Minutes Intravenous Once 05/13/19 0815 05/13/19 1108   05/12/19 1330  meropenem (MERREM) 1 g in sodium chloride 0.9 % 100 mL IVPB     1 g 200 mL/hr over 30 Minutes Intravenous Every 8 hours 05/12/19 1243     05/11/19 2000  vancomycin (VANCOCIN) IVPB 1000 mg/200 mL premix  Status:  Discontinued     1,000 mg 200 mL/hr over 60 Minutes Intravenous Every 36 hours 05/10/19 2057 05/10/19 2112   05/11/19 2000  vancomycin (VANCOCIN) 1,250 mg in sodium chloride 0.9 % 250 mL IVPB  Status:  Discontinued     1,250 mg 166.7 mL/hr over 90 Minutes Intravenous Every 36 hours 05/10/19 2112 05/11/19 1118   05/11/19 0600  ceFEPIme (MAXIPIME) 2 g in sodium chloride 0.9 % 100 mL IVPB  Status:  Discontinued     2 g 200 mL/hr over 30 Minutes Intravenous Every 12 hours 05/10/19 2057 05/12/19 0955   05/10/19 1445  vancomycin (VANCOCIN) 2,000 mg in sodium chloride 0.9 % 500 mL IVPB     2,000 mg 250 mL/hr over 120 Minutes Intravenous  Once 05/10/19 1438 05/10/19 2009   05/10/19 1445  ceFEPIme (MAXIPIME) 2 g in sodium chloride 0.9 % 100 mL IVPB     2 g 200 mL/hr over 30 Minutes Intravenous  Once 05/10/19 1442 05/10/19 1615   05/10/19 1415  aztreonam (AZACTAM) 2 g in sodium chloride 0.9 % 100 mL IVPB  Status:  Discontinued     2 g 200 mL/hr over 30 Minutes Intravenous  Once 05/10/19 1414 05/10/19 1440   05/10/19 1415  metroNIDAZOLE (FLAGYL) IVPB 500 mg     500 mg 100 mL/hr over 60 Minutes Intravenous  Once 05/10/19 1414 05/10/19 1615   05/10/19 1415   vancomycin (VANCOCIN) IVPB 1000 mg/200 mL premix  Status:  Discontinued     1,000 mg 200 mL/hr over 60 Minutes Intravenous  Once 05/10/19 1414 05/10/19 1438      Inpatient Medications  Scheduled Meds: . acidophilus  1 capsule Oral QODAY  . amantadine  100 mg Oral Daily  . B-complex with vitamin C  1 tablet Oral Daily  . buPROPion  150 mg Oral QPC breakfast  . dexamethasone (DECADRON) injection  6 mg Intravenous Daily  . gabapentin  400 mg Oral BID  . heparin  5,000 Units Subcutaneous Q8H  . insulin aspart  0-15 Units Subcutaneous TID WC  . Melatonin  2 tablet Oral QHS  . mirabegron ER  50 mg Oral QHS  . multivitamin with minerals  1 tablet Oral Daily  . pantoprazole  40 mg Oral Daily  . ascorbic acid  500 mg Oral BID  . zinc sulfate  220 mg Oral Daily   Continuous Infusions: . meropenem (MERREM) IV 1 g (05/14/19 [id number])  . remdesivir 100 mg in NS 250 mL 100 mg (05/14/19 0858)   PRN Meds:.acetaminophen **OR** acetaminophen, acetaminophen, guaiFENesin-dextromethorphan, [DISCONTINUED] ondansetron **OR** ondansetron (ZOFRAN) IV, traMADol, zolpidem   Time Spent in minutes  25  See all Orders from today for further details   Oren Binet M.D on 05/14/2019 at 10:45 AM  To page go to [email or web address] - use universal password  [facility] -  Office  [phone]    Objective:   Vitals:   05/14/19 0800 05/14/19 0846 05/14/19 0858 05/14/19 0900  BP: (!) 141/69     Pulse: (!) 101 (!) 102 (!) 107 (!) 101  Resp:      Temp: 97.7 F (36.5 C)     TempSrc: Oral     SpO2: 95% (!) 88% (!) 86% 93%  Weight:      Height:        Wt Readings from Last 3 Encounters:  05/10/19 120 kg  04/21/18 119.5 kg  02/24/18 123.8 kg     Intake/Output Summary (Last 24 hours) at 05/14/2019 1045 Last data filed at 05/14/2019 0536 Gross per 24 hour  Intake 1164.33 ml  Output 1500 ml  Net -335.67 ml     Physical Exam Gen Exam:Alert awake-not in any distress HEENT:atraumatic,  normocephalic Chest: B/L clear to auscultation anteriorly CVS:S1S2 regular Abdomen:soft non tender, non distended Extremities:no edema Neurology: 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 Skin: no rash   Data Review:    CBC Recent Labs  Lab 05/10/19 1705 05/10/19 1956 05/11/19 0447 05/12/19 0150 05/13/19 0200 05/14/19 0512  WBC 7.9 10.7* 9.2 8.3 10.1 10.7*  HGB 11.6* 9.9* 9.9* 9.9* 10.1* 11.1*  HCT 35.1* 30.0* 30.5* 29.9* 30.6* 32.7*  PLT 145* 128* 144* 173 224 237  MCV 86.2 87.5 86.9 85.4 85.2 83.2  MCH 28.5 28.9 28.2 28.3 28.1 28.2  MCHC 33.0 33.0 32.5 33.1 33.0 33.9  RDW 15.1 15.2 15.3 15.4 15.6* 15.1  LYMPHSABS 0.5*  --   --  0.5* 0.4* 0.8  MONOABS 0.5  --   --  0.3 0.5 0.4  EOSABS 0.0  --   --  0.0 0.0 0.0  BASOSABS 0.0  --   --  0.0 0.0 0.2*    Chemistries  Recent Labs  Lab 05/10/19 1615 05/10/19 1956 05/11/19 0447 05/12/19 0150 05/13/19 0200 05/14/19 0210  NA 131*  --  140 140 140 137  K 4.2  --  4.0 4.1 4.0 4.3  CL 95*  --  107 108 109 107  CO2 23  --  22 19* 18* 19*  GLUCOSE 342*  --  261* 347* 245* 100*  BUN 25*  --  24* 30* 30* 27*  CREATININE 2.54* 1.95* 1.67* 1.62* 1.38* 1.23*  CALCIUM 7.6*  --  7.3* 7.7* 8.6* 8.9  MG  --   --   --  1.6* 1.8 2.6*  AST 791*  --  492* 266* 95* 66*  ALT 325*  --  272* 234* 166* 123*  ALKPHOS 462*  --  395* 391* 358* 330*  BILITOT 2.7*  --  2.4* 1.4* 0.8 1.2   ------------------------------------------------------------------------------------------------------------------ No results for input(s): CHOL, HDL, LDLCALC, TRIG, CHOLHDL, LDLDIRECT in the last 72 hours.  Lab Results  Component Value Date   HGBA1C 7.6 (H) 05/10/2019   ------------------------------------------------------------------------------------------------------------------ No results for input(s): TSH, T4TOTAL, T3FREE, THYROIDAB in the last 72 hours.   Invalid input(s): FREET3 ------------------------------------------------------------------------------------------------------------------ No results for input(s): VITAMINB12, FOLATE, FERRITIN, TIBC, IRON, RETICCTPCT in the last 72 hours.  Coagulation profile Recent Labs  Lab 05/10/19 1452 05/10/19 1956 05/12/19 0150  INR 1.1 1.1 1.0    Recent Labs    05/13/19 0200 05/14/19 0512  DDIMER 1.35* 1.43*    Cardiac Enzymes No results for input(s): CKMB, TROPONINI, MYOGLOBIN in the last 168 hours.  Invalid input(s): CK ------------------------------------------------------------------------------------------------------------------    Component Value Date/Time   BNP 697.0 (H) 05/14/2019 [id number]    Micro Results Recent Results (from the past 240 hour(s))  Blood Culture (routine x 2)     Status: None (Preliminary result)   Collection Time: 05/10/19  2:52 PM   Specimen: BLOOD  Result Value Ref Range Status   Specimen Description   Final    BLOOD RIGHT FOREARM Performed at [facility] [address], [place], [place] [address]    Special Requests   Final    BOTTLES DRAWN AEROBIC AND ANAEROBIC Blood Culture results may not be optimal due to an excessive volume of blood received in culture bottles Performed at [facility] [address], [place], [place] [address]    Culture   Final    NO GROWTH 4 DAYS Performed at [facility], [place] [address] [place], [place] [address]    Report Status PENDING  Incomplete  Blood Culture (routine x 2)     Status: None (Preliminary result)   Collection Time: 05/10/19  2:52 PM   Specimen: BLOOD  Result Value Ref Range Status   Specimen Description   Final    BLOOD RIGHT FOREARM Performed at [facility] [address], [place], [place] [address]    Special Requests   Final    BOTTLES DRAWN AEROBIC AND ANAEROBIC Blood Culture results may not be optimal due to an excessive  volume of blood received in culture bottles Performed at [facility] [address] [place], [place] [address]    Culture   Final    NO GROWTH 4 DAYS Performed at [facility], [place] [address] [place],  [address]  Report Status PENDING  Incomplete  SARS CORONAVIRUS 2 (TAT 6-24 HRS) Nasopharyngeal Nasopharyngeal Swab     Status: Abnormal   Collection Time: 05/10/19  5:40 PM   Specimen: Nasopharyngeal Swab  Result Value Ref Range Status   SARS Coronavirus 2 POSITIVE (A) NEGATIVE Final    Comment: RESULT CALLED TO, READ BACK BY AND VERIFIED WITH: Helane Rima, RN AT [phone] ON 05/11/2019 BY SAINVILUS S (NOTE) SARS-CoV-2 target nucleic acids are DETECTED. The SARS-CoV-2 RNA is generally detectable in upper and lower respiratory specimens during the acute phase of infection. Positive results are indicative of active infection with SARS-CoV-2. Clinical  correlation with patient history and other diagnostic information is necessary to determine patient infection status. Positive results do  not rule out bacterial infection or co-infection with other viruses. The expected result is Negative. Fact Sheet for Patients: SugarRoll.be Fact Sheet for Healthcare Providers: https://www.woods-mathews.com/ This test is not yet approved or cleared by the [place] FDA and  has been authorized for detection and/or diagnosis of SARS-CoV-2 by FDA under an Emergency Use Authorization (EUA). This EUA will remain  in effect (meaning this test  can be used) for the duration of the COVID-19 declaration under Section 564(b)(1) of the Act, 21 U.S.C. section 360bbb-3(b)(1), unless the authorization is terminated or revoked sooner. Performed at [facility], [place] [address] [place], [place] [address]   Urine culture     Status: Abnormal   Collection Time: 05/10/19 11:50 PM   Specimen: In/Out Cath Urine  Result Value Ref  Range Status   Specimen Description   Final    IN/OUT CATH URINE Performed at [facility] [address] [place], [place] [address]    Special Requests   Final    NONE Performed at [facility], [place] [address] [place], [place] [address]    Culture (A)  Final    40,000 COLONIES/mL KLEBSIELLA PNEUMONIAE Confirmed Extended Spectrum Beta-Lactamase Producer (ESBL).  In bloodstream infections from ESBL organisms, carbapenems are preferred over piperacillin/tazobactam. They are shown to have a lower risk of mortality.    Report Status 05/12/2019 FINAL  Final   Organism ID, Bacteria KLEBSIELLA PNEUMONIAE (A)  Final      Susceptibility   Klebsiella pneumoniae - MIC*    AMPICILLIN >=32 RESISTANT Resistant     CEFAZOLIN >=64 RESISTANT Resistant     CEFTRIAXONE >=64 RESISTANT Resistant     CIPROFLOXACIN >=4 RESISTANT Resistant     GENTAMICIN >=16 RESISTANT Resistant     IMIPENEM <=0.25 SENSITIVE Sensitive     NITROFURANTOIN 64 INTERMEDIATE Intermediate     TRIMETH/SULFA >=320 RESISTANT Resistant     AMPICILLIN/SULBACTAM >=32 RESISTANT Resistant     PIP/TAZO 16 SENSITIVE Sensitive     Extended ESBL POSITIVE Resistant     * 40,000 COLONIES/mL KLEBSIELLA PNEUMONIAE    Radiology Reports Dg Chest Port 1 View  Result Date: 05/10/2019 CLINICAL DATA:  Patient arrived from [place]. Patient facility called due to patient being more lethargic than normal. H/o Covid-19. Cardiomyopathy, Chronic Systolic Heart Failure, HTN, MS. Former smoker. EXAM: PORTABLE CHEST 1 VIEW COMPARISON:  Chest radiograph 11/07/2015 FINDINGS: Stable cardiomediastinal contours. There are subtle airspace opacities throughout the right lung. Small linear opacities at the left base likely reflect atelectasis. No pneumothorax or large pleural effusion. No acute finding in the visualized skeleton. IMPRESSION: Asymmetric hazy airspace opacities throughout the right lung raise concern for  infection versus asymmetric edema. Minimal linear opacities at the left  base likely atelectasis. Electronically Signed   By: Audie Pinto M.D.   On: 05/10/2019 16:15

## 2019-05-15 LAB — COMPREHENSIVE METABOLIC PANEL
ALT: 96 U/L — ABNORMAL HIGH (ref 0–44)
AST: 39 U/L (ref 15–41)
Albumin: 2.5 g/dL — ABNORMAL LOW (ref 3.5–5.0)
Alkaline Phosphatase: 304 U/L — ABNORMAL HIGH (ref 38–126)
Anion gap: 10 (ref 5–15)
BUN: 34 mg/dL — ABNORMAL HIGH (ref 6–20)
CO2: 25 mmol/L (ref 22–32)
Calcium: 9 mg/dL (ref 8.9–10.3)
Chloride: 106 mmol/L (ref 98–111)
Creatinine, Ser: 1.07 mg/dL — ABNORMAL HIGH (ref 0.44–1.00)
GFR calc Af Amer: >60 mL/min (ref >60)
GFR calc non Af Amer: 58 mL/min — ABNORMAL LOW (ref >60)
Glucose, Bld: 174 mg/dL — ABNORMAL HIGH (ref 70–99)
Potassium: 3.6 mmol/L (ref 3.5–5.1)
Sodium: 141 mmol/L (ref 135–145)
Total Bilirubin: 1.1 mg/dL (ref 0.3–1.2)
Total Protein: 6 g/dL — ABNORMAL LOW (ref 6.5–8.1)

## 2019-05-15 LAB — CBC WITH DIFFERENTIAL/PLATELET
Abs Immature Granulocytes: 1.15 10*3/uL — ABNORMAL HIGH (ref 0.00–0.07)
Basophils Absolute: 0 10*3/uL (ref 0.0–0.1)
Basophils Relative: 0 %
Eosinophils Absolute: 0 10*3/uL (ref 0.0–0.5)
Eosinophils Relative: 0 %
HCT: 30.2 % — ABNORMAL LOW (ref 36.0–46.0)
Hemoglobin: 10.2 g/dL — ABNORMAL LOW (ref 12.0–15.0)
Immature Granulocytes: 12 %
Lymphocytes Relative: 7 %
Lymphs Abs: 0.7 10*3/uL (ref 0.7–4.0)
MCH: 27.9 pg (ref 26.0–34.0)
MCHC: 33.8 g/dL (ref 30.0–36.0)
MCV: 82.5 fL (ref 80.0–100.0)
Monocytes Absolute: 0.7 10*3/uL (ref 0.1–1.0)
Monocytes Relative: 7 %
Neutro Abs: 7.4 10*3/uL (ref 1.7–7.7)
Neutrophils Relative %: 74 %
Platelets: 232 10*3/uL (ref 150–400)
RBC: 3.66 MIL/uL — ABNORMAL LOW (ref 3.87–5.11)
RDW: 15.1 % (ref 11.5–15.5)
WBC: 9.9 10*3/uL (ref 4.0–10.5)
nRBC: 0.8 % — ABNORMAL HIGH (ref 0.0–0.2)

## 2019-05-15 LAB — CULTURE, BLOOD (ROUTINE X 2)
Culture: NO GROWTH
Culture: NO GROWTH

## 2019-05-15 LAB — C-REACTIVE PROTEIN: CRP: 20.1 mg/dL — ABNORMAL HIGH (ref <1.0)

## 2019-05-15 LAB — GLUCOSE, CAPILLARY
Glucose-Capillary: 145 mg/dL — ABNORMAL HIGH (ref 70–99)
Glucose-Capillary: 232 mg/dL — ABNORMAL HIGH (ref 70–99)
Glucose-Capillary: 333 mg/dL — ABNORMAL HIGH (ref 70–99)
Glucose-Capillary: 324 mg/dL — ABNORMAL HIGH (ref 70–99)

## 2019-05-15 LAB — MAGNESIUM: Magnesium: 2.3 mg/dL (ref 1.7–2.4)

## 2019-05-15 LAB — D-DIMER, QUANTITATIVE: D-Dimer, Quant: 1.6 ug/mL-FEU — ABNORMAL HIGH (ref 0.00–0.50)

## 2019-05-15 LAB — BRAIN NATRIURETIC PEPTIDE: B Natriuretic Peptide: 682.7 pg/mL — ABNORMAL HIGH (ref 0.0–100.0)

## 2019-05-15 MED ORDER — FUROSEMIDE 10 MG/ML IJ SOLN
40.0000 mg | Freq: Once | INTRAMUSCULAR | Status: AC
  Administered 2019-05-15: 13:00:00 40 mg via INTRAVENOUS
  Filled 2019-05-15: qty 4

## 2019-05-15 MED ORDER — METHYLPREDNISOLONE SODIUM SUCC 125 MG IJ SOLR
60.0000 mg | Freq: Two times a day (BID) | INTRAMUSCULAR | Status: DC
  Administered 2019-05-15: 09:00:00 60 mg via INTRAVENOUS
  Administered 2019-05-15: 21:00:00 62.5 mg via INTRAVENOUS
  Administered 2019-05-16 (×2): 60 mg via INTRAVENOUS
  Filled 2019-05-15 (×4): qty 2

## 2019-05-15 MED ORDER — ENOXAPARIN SODIUM 60 MG/0.6ML ~~LOC~~ SOLN
0.5000 mg/kg | SUBCUTANEOUS | Status: DC
  Administered 2019-05-15 – 2019-05-18 (×3): 60 mg via SUBCUTANEOUS
  Filled 2019-05-15 (×4): qty 0.6

## 2019-05-15 NOTE — Progress Notes (Addendum)
PROGRESS NOTE                                                                                                                                                                                                             Patient Demographics:    Shannon Garrett, is a 58 y.o. female, DOB - Aug 05, 1960, [id number]  Outpatient Primary MD for the patient is Gildardo Cranker, DO   Admit date - 05/10/2019   LOS - 5  Chief Complaint  Patient presents with  . COVID       Brief Narrative: Patient is a 58 y.o. female with PMHx of multiple sclerosis (bed to wheelchair bound) HTN, neurogenic bladder with history of recurrent UTIs-resident of SNF-presented to the hospital with lethargy, hypotension-subsequently thought to have sepsis with AKI secondary to Covid 19 pneumonia and Klebsiella ESBL UTI.  See below for further details.   Subjective:    Shannon Garrett today lying comfortably in bed-O2 saturation in the high 90s on just 1-2 L of oxygen.  She denies any worsening shortness of breath compared to the past few days.   Assessment  & Plan :   Sepsis secondary to ESBL Klebsiella complicated UTI and [id number] pneumonia: Sepsis physiology has resolved-remains on meropenem (day 4/7)-and remdesivir/steroids for COVID-19.  CRP is worsening-although her oxygen requirement has not changed-we will switch to Solu-Medrol and discontinue the Decadron.   Fever: afebrile  O2 requirements: On 1-2L/m  COVID-19 Labs: Recent Labs    05/13/19 0200 05/14/19 0210 05/14/19 0512 05/15/19 0407  DDIMER 1.35*  --  1.43* 1.60*  CRP 11.1* 14.7*  --  20.1*    Lab Results  Component Value Date   SARSCOV2NAA POSITIVE (A) 05/10/2019     COVID-19 Medications: Steroids:11/8>> Remdesivir:11/11>> Actemra: Not given-probably contraindicated due to sepsis from ESBL Klebsiella UTI. Convalescent Plasma: Not indicated given relative clinical stability from  COVID-19. Research Studies:N/A  Other medications: Diuretics: Euvolemic-repeat Lasix x1 to maintain negative balance.   Antibiotics: See below  Prone/Incentive Spirometry: encouraged incentive spirometry use 3-4/hour  DVT Prophylaxis  : Lovenox  Mild acute hypoxic respiratory failure: Secondary to COVID-19 pneumonia-remains stable on just 1-2 L of oxygen.  See above.-only on 2 L of oxygen this morning-continue supportive care.   Transaminitis: Secondary to COVID-19-slowly improving-once ALT was consistently below 250-remdesivir was started on 11/11.  LFTs continue  to downtrend-follow periodically.  Multiple sclerosis: Appears advanced-only able to move her right upper extremity-rest of extremities are very weak from MS.  She is bed to wheelchair bound-and has not ambulated any years.  GERD: Continue PPI  Insomnia/fatigue/chronic pain syndrome: Suspect all related to MS-continue amantadine, Neurontin  Obesity: Estimated body mass index is 44.02 kg/m as calculated from the following:   Height as of this encounter: 5\' 5"  (1.651 m).   Weight as of this encounter: 120 kg.   ABG:    Component Value Date/Time   PHART 7.420 07/10/2015 1801   PCO2ART 41.2 07/10/2015 1801   PO2ART 417.0 (H) 07/10/2015 1801   HCO3 26.7 (H) 07/10/2015 1801   TCO2 28 07/10/2015 1801   O2SAT 100.0 07/10/2015 1801    Vent Settings: N/A   Condition - Extremely Guarded  Family Communication : Spoke with patient's sister over the phone on 11/13  Code Status :  DNR  Diet :  Diet Order            DIET DYS 3 Room service appropriate? Yes; Fluid consistency: Thin  Diet effective now               Disposition Plan  :  Remain hospitalized  Barriers to discharge: complete 5 days of IV Remdesivir  Consults  : Palliative care  Procedures  :  None  Antibiotics  :    Anti-infectives (From admission, onward)   Start     Dose/Rate Route Frequency Ordered Stop   05/14/19 1000  remdesivir 100 mg in  sodium chloride 0.9 % 250 mL IVPB     100 mg 500 mL/hr over 30 Minutes Intravenous Every 24 hours 05/13/19 0815 05/18/19 0959   05/13/19 1000  remdesivir 200 mg in sodium chloride 0.9 % 250 mL IVPB     200 mg 500 mL/hr over 30 Minutes Intravenous Once 05/13/19 0815 05/13/19 1108   05/12/19 1330  meropenem (MERREM) 1 g in sodium chloride 0.9 % 100 mL IVPB     1 g 200 mL/hr over 30 Minutes Intravenous Every 8 hours 05/12/19 1243 05/19/19 1359   05/11/19 2000  vancomycin (VANCOCIN) IVPB 1000 mg/200 mL premix  Status:  Discontinued     1,000 mg 200 mL/hr over 60 Minutes Intravenous Every 36 hours 05/10/19 2057 05/10/19 2112   05/11/19 2000  vancomycin (VANCOCIN) 1,250 mg in sodium chloride 0.9 % 250 mL IVPB  Status:  Discontinued     1,250 mg 166.7 mL/hr over 90 Minutes Intravenous Every 36 hours 05/10/19 2112 05/11/19 1118   05/11/19 0600  ceFEPIme (MAXIPIME) 2 g in sodium chloride 0.9 % 100 mL IVPB  Status:  Discontinued     2 g 200 mL/hr over 30 Minutes Intravenous Every 12 hours 05/10/19 2057 05/12/19 0955   05/10/19 1445  vancomycin (VANCOCIN) 2,000 mg in sodium chloride 0.9 % 500 mL IVPB     2,000 mg 250 mL/hr over 120 Minutes Intravenous  Once 05/10/19 1438 05/10/19 2009   05/10/19 1445  ceFEPIme (MAXIPIME) 2 g in sodium chloride 0.9 % 100 mL IVPB     2 g 200 mL/hr over 30 Minutes Intravenous  Once 05/10/19 1442 05/10/19 1615   05/10/19 1415  aztreonam (AZACTAM) 2 g in sodium chloride 0.9 % 100 mL IVPB  Status:  Discontinued     2 g 200 mL/hr over 30 Minutes Intravenous  Once 05/10/19 1414 05/10/19 1440   05/10/19 1415  metroNIDAZOLE (FLAGYL) IVPB 500 mg  500 mg 100 mL/hr over 60 Minutes Intravenous  Once 05/10/19 1414 05/10/19 1615   05/10/19 1415  vancomycin (VANCOCIN) IVPB 1000 mg/200 mL premix  Status:  Discontinued     1,000 mg 200 mL/hr over 60 Minutes Intravenous  Once 05/10/19 1414 05/10/19 1438      Inpatient Medications  Scheduled Meds: . acidophilus  1 capsule  Oral QODAY  . amantadine  100 mg Oral Daily  . B-complex with vitamin C  1 tablet Oral Daily  . buPROPion  150 mg Oral QPC breakfast  . enoxaparin (LOVENOX) injection  0.5 mg/kg Subcutaneous Q24H  . gabapentin  400 mg Oral BID  . insulin aspart  0-15 Units Subcutaneous TID WC  . Melatonin  2 tablet Oral QHS  . methylPREDNISolone (SOLU-MEDROL) injection  60 mg Intravenous Q12H  . mirabegron ER  50 mg Oral QHS  . multivitamin with minerals  1 tablet Oral Daily  . pantoprazole  40 mg Oral Daily  . ascorbic acid  500 mg Oral BID  . zinc sulfate  220 mg Oral Daily   Continuous Infusions: . meropenem (MERREM) IV 1 g (05/15/19 0553)  . remdesivir 100 mg in NS 250 mL 100 mg (05/15/19 1053)   PRN Meds:.acetaminophen **OR** acetaminophen, acetaminophen, guaiFENesin-dextromethorphan, [DISCONTINUED] ondansetron **OR** ondansetron (ZOFRAN) IV, traMADol, zolpidem   Time Spent in minutes  25  See all Orders from today for further details   Oren Binet M.D on 05/15/2019 at 10:57 AM  To page go to [email or web address] - use universal password  [facility] -  Office  [phone]    Objective:   Vitals:   05/14/19 1600 05/14/19 2000 05/15/19 0000 05/15/19 0400  BP: 129/82 (!) 142/81 131/80   Pulse: 98 97 94   Resp: 16 16 16 16   Temp: (!) 97.5 F (36.4 C) 98.3 F (36.8 C) 98.1 F (36.7 C) 98.3 F (36.8 C)  TempSrc: Axillary Oral Oral Oral  SpO2: 99% 100% 99%   Weight:      Height:        Wt Readings from Last 3 Encounters:  05/10/19 120 kg  04/21/18 119.5 kg  02/24/18 123.8 kg     Intake/Output Summary (Last 24 hours) at 05/15/2019 1057 Last data filed at 05/15/2019 0600 Gross per 24 hour  Intake 745.39 ml  Output 3500 ml  Net -2754.61 ml     Physical Exam Gen Exam:Alert awake-not in any distress HEENT:atraumatic, normocephalic Chest: B/L clear to auscultation anteriorly CVS:S1S2 regular Abdomen:soft non tender, non distended Extremities:no edema Neurology:  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   Skin: no rash  Data Review:    CBC Recent Labs  Lab 05/10/19 1705  05/11/19 0447 05/12/19 0150 05/13/19 0200 05/14/19 0512 05/15/19 0407  WBC 7.9   < > 9.2 8.3 10.1 10.7* 9.9  HGB 11.6*   < > 9.9* 9.9* 10.1* 11.1* 10.2*  HCT 35.1*   < > 30.5* 29.9* 30.6* 32.7* 30.2*  PLT 145*   < > 144* 173 224 237 232  MCV 86.2   < > 86.9 85.4 85.2 83.2 82.5  MCH 28.5   < > 28.2 28.3 28.1 28.2 27.9  MCHC 33.0   < > 32.5 33.1 33.0 33.9 33.8  RDW 15.1   < > 15.3 15.4 15.6* 15.1 15.1  LYMPHSABS 0.5*  --   --  0.5* 0.4* 0.8 0.7  MONOABS 0.5  --   --  0.3 0.5 0.4 0.7  EOSABS 0.0  --   --  0.0 0.0 0.0 0.0  BASOSABS 0.0  --   --  0.0 0.0 0.2* 0.0   < > = values in this interval not displayed.    Chemistries  Recent Labs  Lab 05/11/19 0447 05/12/19 0150 05/13/19 0200 05/14/19 0210 05/15/19 0407  NA 140 140 140 137 141  K 4.0 4.1 4.0 4.3 3.6  CL 107 108 109 107 106  CO2 22 19* 18* 19* 25  GLUCOSE 261* 347* 245* 100* 174*  BUN 24* 30* 30* 27* 34*  CREATININE 1.67* 1.62* 1.38* 1.23* 1.07*  CALCIUM 7.3* 7.7* 8.6* 8.9 9.0  MG  --  1.6* 1.8 2.6* 2.3  AST 492* 266* 95* 66* 39  ALT 272* 234* 166* 123* 96*  ALKPHOS 395* 391* 358* 330* 304*  BILITOT 2.4* 1.4* 0.8 1.2 1.1   ------------------------------------------------------------------------------------------------------------------ No results for input(s): CHOL, HDL, LDLCALC, TRIG, CHOLHDL, LDLDIRECT in the last 72 hours.  Lab Results  Component Value Date   HGBA1C 7.6 (H) 05/10/2019   ------------------------------------------------------------------------------------------------------------------ No results for input(s): TSH, T4TOTAL, T3FREE, THYROIDAB in the last 72 hours.  Invalid input(s): FREET3 ------------------------------------------------------------------------------------------------------------------ No results for  input(s): VITAMINB12, FOLATE, FERRITIN, TIBC, IRON, RETICCTPCT in the last 72 hours.  Coagulation profile Recent Labs  Lab 05/10/19 1452 05/10/19 1956 05/12/19 0150  INR 1.1 1.1 1.0    Recent Labs    05/14/19 0512 05/15/19 0407  DDIMER 1.43* 1.60*    Cardiac Enzymes No results for input(s): CKMB, TROPONINI, MYOGLOBIN in the last 168 hours.  Invalid input(s): CK ------------------------------------------------------------------------------------------------------------------    Component Value Date/Time   BNP 682.7 (H) 05/15/2019 0407    Micro Results Recent Results (from the past 240 hour(s))  Blood Culture (routine x 2)     Status: None (Preliminary result)   Collection Time: 05/10/19  2:52 PM   Specimen: BLOOD  Result Value Ref Range Status   Specimen Description   Final    BLOOD RIGHT FOREARM Performed at [facility] [address], [place], [place] [address]    Special Requests   Final    BOTTLES DRAWN AEROBIC AND ANAEROBIC Blood Culture results may not be optimal due to an excessive volume of blood received in culture bottles Performed at [facility] [address], [place], [place] [address]    Culture   Final    NO GROWTH 4 DAYS Performed at [facility], [place] [address] [place], [place] [address]    Report Status PENDING  Incomplete  Blood Culture (routine x 2)     Status: None (Preliminary result)   Collection Time: 05/10/19  2:52 PM   Specimen: BLOOD  Result Value Ref Range Status   Specimen Description   Final    BLOOD RIGHT FOREARM Performed at [facility] [address] [place], [place] [address]    Special Requests   Final    BOTTLES DRAWN AEROBIC AND ANAEROBIC Blood Culture results may not be optimal due to an excessive volume of blood received in culture bottles Performed at [facility] [address], [place], [place] [address]    Culture   Final     NO GROWTH 4 DAYS Performed at [facility], [place] [address] [place], [place] [address]    Report Status PENDING  Incomplete  SARS CORONAVIRUS 2 (TAT 6-24 HRS) Nasopharyngeal Nasopharyngeal Swab     Status: Abnormal  Collection Time: 05/10/19  5:40 PM   Specimen: Nasopharyngeal Swab  Result Value Ref Range Status   SARS Coronavirus 2 POSITIVE (A) NEGATIVE Final    Comment: RESULT CALLED TO, READ BACK BY AND VERIFIED WITH: Helane Rima, RN AT [phone] ON 05/11/2019 BY SAINVILUS S (NOTE) SARS-CoV-2 target nucleic acids are DETECTED. The SARS-CoV-2 RNA is generally detectable in upper and lower respiratory specimens during the acute phase of infection. Positive results are indicative of active infection with SARS-CoV-2. Clinical  correlation with patient history and other diagnostic information is necessary to determine patient infection status. Positive results do  not rule out bacterial infection or co-infection with other viruses. The expected result is Negative. Fact Sheet for Patients: SugarRoll.be Fact Sheet for Healthcare Providers: https://www.woods-mathews.com/ This test is not yet approved or cleared by the [place] FDA and  has been authorized for detection and/or diagnosis of SARS-CoV-2 by FDA under an Emergency Use Authorization (EUA). This EUA will remain  in effect (meaning this test  can be used) for the duration of the COVID-19 declaration under Section 564(b)(1) of the Act, 21 U.S.C. section 360bbb-3(b)(1), unless the authorization is terminated or revoked sooner. Performed at [facility], [place] [address] [place], [place] [address]   Urine culture     Status: Abnormal   Collection Time: 05/10/19 11:50 PM   Specimen: In/Out Cath Urine  Result Value Ref Range Status   Specimen Description   Final    IN/OUT CATH URINE Performed at [facility] [address] [place], [place] [address]     Special Requests   Final    NONE Performed at [facility], [place] [address] [place], [place] [address]    Culture (A)  Final    40,000 COLONIES/mL KLEBSIELLA PNEUMONIAE Confirmed Extended Spectrum Beta-Lactamase Producer (ESBL).  In bloodstream infections from ESBL organisms, carbapenems are preferred over piperacillin/tazobactam. They are shown to have a lower risk of mortality.    Report Status 05/12/2019 FINAL  Final   Organism ID, Bacteria KLEBSIELLA PNEUMONIAE (A)  Final      Susceptibility   Klebsiella pneumoniae - MIC*    AMPICILLIN >=32 RESISTANT Resistant     CEFAZOLIN >=64 RESISTANT Resistant     CEFTRIAXONE >=64 RESISTANT Resistant     CIPROFLOXACIN >=4 RESISTANT Resistant     GENTAMICIN >=16 RESISTANT Resistant     IMIPENEM <=0.25 SENSITIVE Sensitive     NITROFURANTOIN 64 INTERMEDIATE Intermediate     TRIMETH/SULFA >=320 RESISTANT Resistant     AMPICILLIN/SULBACTAM >=32 RESISTANT Resistant     PIP/TAZO 16 SENSITIVE Sensitive     Extended ESBL POSITIVE Resistant     * 40,000 COLONIES/mL KLEBSIELLA PNEUMONIAE    Radiology Reports Dg Chest Port 1 View  Result Date: 05/10/2019 CLINICAL DATA:  Patient arrived from [place]. Patient facility called due to patient being more lethargic than normal. H/o Covid-19. Cardiomyopathy, Chronic Systolic Heart Failure, HTN, MS. Former smoker. EXAM: PORTABLE CHEST 1 VIEW COMPARISON:  Chest radiograph 11/07/2015 FINDINGS: Stable cardiomediastinal contours. There are subtle airspace opacities throughout the right lung. Small linear opacities at the left base likely reflect atelectasis. No pneumothorax or large pleural effusion. No acute finding in the visualized skeleton. IMPRESSION: Asymmetric hazy airspace opacities throughout the right lung raise concern for infection versus asymmetric edema. Minimal linear opacities at the left base likely atelectasis. Electronically Signed   By: Audie Pinto M.D.   On:  05/10/2019 16:15

## 2019-05-15 NOTE — Progress Notes (Addendum)
Pharmacy Antibiotic Note  Shannon Garrett is a 58 y.o. female admitted on 05/10/2019 with UTI.  Pharmacy has been consulted for meropenem dosing given urinary symptoms coupled with urine culture growing ESBL Klebsiella.  Plan: -Continue meropenem 1 gm IV q 8 hours -LOT is 7 days per MD note.  -Monitor CBC, renal fx and clinical progress   Height: 5\' 5"  (165.1 cm) Weight: 264 lb 8.8 oz (120 kg) IBW/kg (Calculated) : 57  Temp (24hrs), Avg:98 F (36.7 C), Min:97.5 F (36.4 C), Max:98.3 F (36.8 C)  Recent Labs  Lab 05/10/19 1452  05/10/19 1740  05/11/19 0447 05/12/19 0150 05/13/19 0200 05/14/19 0210 05/14/19 0512 05/15/19 0407  WBC  --    < >  --    < > 9.2 8.3 10.1  --  10.7* 9.9  CREATININE  --    < >  --    < > 1.67* 1.62* 1.38* 1.23*  --  1.07*  LATICACIDVEN 1.8  --  1.2  --   --   --   --   --   --   --    < > = values in this interval not displayed.    Estimated Creatinine Clearance: 76.2 mL/min (A) (by C-G formula based on SCr of 1.07 mg/dL (H)).    Allergies  Allergen Reactions  . Sulfonamide Derivatives Hives and Shortness Of Breath  . Penicillins Hives and Itching    Has patient had a PCN reaction causing immediate rash, facial/tongue/throat swelling, SOB or lightheadedness with hypotension: unknown Has patient had a PCN reaction causing severe rash involving mucus membranes or skin necrosis: unknown Has patient had a PCN reaction that required hospitalization: unknown Has patient had a PCN reaction occurring within the last 10 years: unknown If all of the above answers are "NO", then may proceed with Cephalosporin use. Prior course of rocephin charted 05/2015     Bernardo Brayman A. Levada Dy, PharmD, BCPS, FNKF Clinical Pharmacist [place] Please utilize Amion for appropriate phone number to reach the unit pharmacist ([place])

## 2019-05-16 LAB — CBC
HCT: 29.9 % — ABNORMAL LOW (ref 36.0–46.0)
Hemoglobin: 10.2 g/dL — ABNORMAL LOW (ref 12.0–15.0)
MCH: 28.2 pg (ref 26.0–34.0)
MCHC: 34.1 g/dL (ref 30.0–36.0)
MCV: 82.6 fL (ref 80.0–100.0)
Platelets: 279 10*3/uL (ref 150–400)
RBC: 3.62 MIL/uL — ABNORMAL LOW (ref 3.87–5.11)
RDW: 14.9 % (ref 11.5–15.5)
WBC: 9.6 10*3/uL (ref 4.0–10.5)
nRBC: 0.2 % (ref 0.0–0.2)

## 2019-05-16 LAB — C-REACTIVE PROTEIN: CRP: 13.8 mg/dL — ABNORMAL HIGH (ref <1.0)

## 2019-05-16 LAB — COMPREHENSIVE METABOLIC PANEL
ALT: 78 U/L — ABNORMAL HIGH (ref 0–44)
AST: 27 U/L (ref 15–41)
Albumin: 2.6 g/dL — ABNORMAL LOW (ref 3.5–5.0)
Alkaline Phosphatase: 267 U/L — ABNORMAL HIGH (ref 38–126)
Anion gap: 12 (ref 5–15)
BUN: 39 mg/dL — ABNORMAL HIGH (ref 6–20)
CO2: 25 mmol/L (ref 22–32)
Calcium: 9 mg/dL (ref 8.9–10.3)
Chloride: 105 mmol/L (ref 98–111)
Creatinine, Ser: 1.04 mg/dL — ABNORMAL HIGH (ref 0.44–1.00)
GFR calc Af Amer: >60 mL/min (ref >60)
GFR calc non Af Amer: >60 mL/min (ref >60)
Glucose, Bld: 311 mg/dL — ABNORMAL HIGH (ref 70–99)
Potassium: 4.1 mmol/L (ref 3.5–5.1)
Sodium: 142 mmol/L (ref 135–145)
Total Bilirubin: 1 mg/dL (ref 0.3–1.2)
Total Protein: 6 g/dL — ABNORMAL LOW (ref 6.5–8.1)

## 2019-05-16 LAB — GLUCOSE, CAPILLARY
Glucose-Capillary: 281 mg/dL — ABNORMAL HIGH (ref 70–99)
Glucose-Capillary: 238 mg/dL — ABNORMAL HIGH (ref 70–99)
Glucose-Capillary: 359 mg/dL — ABNORMAL HIGH (ref 70–99)
Glucose-Capillary: 257 mg/dL — ABNORMAL HIGH (ref 70–99)

## 2019-05-16 LAB — D-DIMER, QUANTITATIVE: D-Dimer, Quant: 1.46 ug/mL-FEU — ABNORMAL HIGH (ref 0.00–0.50)

## 2019-05-16 LAB — FERRITIN: Ferritin: 656 ng/mL — ABNORMAL HIGH (ref 11–307)

## 2019-05-16 MED ORDER — INSULIN GLARGINE 100 UNIT/ML ~~LOC~~ SOLN
12.0000 [IU] | Freq: Every day | SUBCUTANEOUS | Status: DC
  Administered 2019-05-16 – 2019-05-17 (×2): 12 [IU] via SUBCUTANEOUS
  Filled 2019-05-16 (×2): qty 0.12

## 2019-05-16 MED ORDER — INSULIN ASPART 100 UNIT/ML ~~LOC~~ SOLN
0.0000 [IU] | Freq: Three times a day (TID) | SUBCUTANEOUS | Status: DC
  Administered 2019-05-16: 20 [IU] via SUBCUTANEOUS
  Administered 2019-05-17: 11 [IU] via SUBCUTANEOUS
  Administered 2019-05-17: 20 [IU] via SUBCUTANEOUS
  Administered 2019-05-17: 15 [IU] via SUBCUTANEOUS
  Administered 2019-05-18: 7 [IU] via SUBCUTANEOUS
  Administered 2019-05-18: 4 [IU] via SUBCUTANEOUS

## 2019-05-16 NOTE — Progress Notes (Addendum)
PROGRESS NOTE                                                                                                                                                                                                             Patient Demographics:    Shannon Garrett, is a 58 y.o. female, DOB - Dec 24, 1960, [id number]  Outpatient Primary MD for the patient is Gildardo Cranker, DO   Admit date - 05/10/2019   LOS - 6  Chief Complaint  Patient presents with  . COVID       Brief Narrative: Patient is a 58 y.o. female with PMHx of multiple sclerosis (bed to wheelchair bound) HTN, neurogenic bladder with history of recurrent UTIs-resident of SNF-presented to the hospital with lethargy, hypotension-subsequently thought to have sepsis with AKI secondary to Covid 19 pneumonia and Klebsiella ESBL UTI.  See below for further details.   Subjective:    Shannon Garrett today lying comfortably in bed-she is being weaned off the oxygen.  No major events overnight.   Assessment  & Plan :   Sepsis secondary to ESBL Klebsiella complicated UTI and [id number] pneumonia: Sepsis physiology has resolved-on meropenem (day 5/7) and combination of steroid/remdesivir for COVID-19.  CRP now downtrending-oxygen being weaned off.  Continue to follow and provide maximal supportive care.   Fever: afebrile  O2 requirements: Anywhere from 1-2 L to room air.  COVID-19 Labs: Recent Labs    05/14/19 0210 05/14/19 0512 05/15/19 0407 05/16/19 0140  DDIMER  --  1.43* 1.60* 1.46*  FERRITIN  --   --   --  656*  CRP 14.7*  --  20.1* 13.8*    Lab Results  Component Value Date   SARSCOV2NAA POSITIVE (A) 05/10/2019     COVID-19 Medications: Steroids:11/8>> Remdesivir:11/11>> Actemra: Not given-probably contraindicated due to sepsis from ESBL Klebsiella UTI. Convalescent Plasma: Not indicated given relative clinical stability from COVID-19. Research Studies:N/A   Other medications: Diuretics: Euvolemic-repeat Lasix x1-continue to maintain negative balance.    Antibiotics: See below  Prone/Incentive Spirometry: encouraged incentive spirometry use 3-4/hour  DVT Prophylaxis  : Lovenox  Mild acute hypoxic respiratory failure: Secondary to COVID-19 pneumonia-on room air this morning but suspect may still require intermittent 1-2 L of oxygen.  Continue supportive care.    Transaminitis: Secondary to COVID-19-slowly improving-once ALT was consistently below 250-remdesivir was started on  11/11.-Continue to downtrend-follow periodically.    Multiple sclerosis: Appears advanced-only able to move her right upper extremity-rest of extremities are very weak from MS.  She is bed to wheelchair bound-and has not ambulated any years.  DM-2 (A1c 7.6): CBGs remain elevated-start Lantus 12 units-change to resistant SSI.  Follow and adjust.  GERD: Continue PPI  Insomnia/fatigue/chronic pain syndrome: Suspect all related to MS-continue amantadine, Neurontin  Obesity: Estimated body mass index is 44.02 kg/m as calculated from the following:   Height as of this encounter: 5\' 5"  (1.651 m).   Weight as of this encounter: 120 kg.   ABG:    Component Value Date/Time   PHART 7.420 07/10/2015 1801   PCO2ART 41.2 07/10/2015 1801   PO2ART 417.0 (H) 07/10/2015 1801   HCO3 26.7 (H) 07/10/2015 1801   TCO2 28 07/10/2015 1801   O2SAT 100.0 07/10/2015 1801    Vent Settings: N/A   Condition - Extremely Guarded  Family Communication : Spoke with patient's sister over the phone on 11/14  Code Status :  DNR  Diet :  Diet Order            DIET DYS 3 Room service appropriate? Yes; Fluid consistency: Thin  Diet effective now               Disposition Plan  :  Remain hospitalized  Barriers to discharge: complete 5 days of IV Remdesivir  Consults  : Palliative care  Procedures  :  None  Antibiotics  :    Anti-infectives (From admission, onward)   Start      Dose/Rate Route Frequency Ordered Stop   05/14/19 1000  remdesivir 100 mg in sodium chloride 0.9 % 250 mL IVPB     100 mg 500 mL/hr over 30 Minutes Intravenous Every 24 hours 05/13/19 0815 05/18/19 0959   05/13/19 1000  remdesivir 200 mg in sodium chloride 0.9 % 250 mL IVPB     200 mg 500 mL/hr over 30 Minutes Intravenous Once 05/13/19 0815 05/13/19 1108   05/12/19 1330  meropenem (MERREM) 1 g in sodium chloride 0.9 % 100 mL IVPB     1 g 200 mL/hr over 30 Minutes Intravenous Every 8 hours 05/12/19 1243 05/19/19 1359   05/11/19 2000  vancomycin (VANCOCIN) IVPB 1000 mg/200 mL premix  Status:  Discontinued     1,000 mg 200 mL/hr over 60 Minutes Intravenous Every 36 hours 05/10/19 2057 05/10/19 2112   05/11/19 2000  vancomycin (VANCOCIN) 1,250 mg in sodium chloride 0.9 % 250 mL IVPB  Status:  Discontinued     1,250 mg 166.7 mL/hr over 90 Minutes Intravenous Every 36 hours 05/10/19 2112 05/11/19 1118   05/11/19 0600  ceFEPIme (MAXIPIME) 2 g in sodium chloride 0.9 % 100 mL IVPB  Status:  Discontinued     2 g 200 mL/hr over 30 Minutes Intravenous Every 12 hours 05/10/19 2057 05/12/19 0955   05/10/19 1445  vancomycin (VANCOCIN) 2,000 mg in sodium chloride 0.9 % 500 mL IVPB     2,000 mg 250 mL/hr over 120 Minutes Intravenous  Once 05/10/19 1438 05/10/19 2009   05/10/19 1445  ceFEPIme (MAXIPIME) 2 g in sodium chloride 0.9 % 100 mL IVPB     2 g 200 mL/hr over 30 Minutes Intravenous  Once 05/10/19 1442 05/10/19 1615   05/10/19 1415  aztreonam (AZACTAM) 2 g in sodium chloride 0.9 % 100 mL IVPB  Status:  Discontinued     2 g 200 mL/hr over 30 Minutes  Intravenous  Once 05/10/19 1414 05/10/19 1440   05/10/19 1415  metroNIDAZOLE (FLAGYL) IVPB 500 mg     500 mg 100 mL/hr over 60 Minutes Intravenous  Once 05/10/19 1414 05/10/19 1615   05/10/19 1415  vancomycin (VANCOCIN) IVPB 1000 mg/200 mL premix  Status:  Discontinued     1,000 mg 200 mL/hr over 60 Minutes Intravenous  Once 05/10/19 1414 05/10/19  1438      Inpatient Medications  Scheduled Meds: . acidophilus  1 capsule Oral QODAY  . amantadine  100 mg Oral Daily  . B-complex with vitamin C  1 tablet Oral Daily  . buPROPion  150 mg Oral QPC breakfast  . enoxaparin (LOVENOX) injection  0.5 mg/kg Subcutaneous Q24H  . gabapentin  400 mg Oral BID  . insulin aspart  0-15 Units Subcutaneous TID WC  . Melatonin  2 tablet Oral QHS  . methylPREDNISolone (SOLU-MEDROL) injection  60 mg Intravenous Q12H  . mirabegron ER  50 mg Oral QHS  . multivitamin with minerals  1 tablet Oral Daily  . pantoprazole  40 mg Oral Daily  . ascorbic acid  500 mg Oral BID  . zinc sulfate  220 mg Oral Daily   Continuous Infusions: . meropenem (MERREM) IV 1 g (05/16/19 0548)  . remdesivir 100 mg in NS 250 mL Stopped (05/16/19 1100)   PRN Meds:.acetaminophen **OR** acetaminophen, acetaminophen, guaiFENesin-dextromethorphan, [DISCONTINUED] ondansetron **OR** ondansetron (ZOFRAN) IV, traMADol, zolpidem   Time Spent in minutes  25  See all Orders from today for further details   Oren Binet M.D on 05/16/2019 at 1:22 PM  To page go to [email or web address] - use universal password  [facility] -  Office  [phone]    Objective:   Vitals:   05/16/19 0400 05/16/19 0800 05/16/19 0859 05/16/19 0901  BP: (!) 141/82   (!) 147/78  Pulse: 92     Resp:      Temp: 98.7 F (37.1 C)  97.8 F (36.6 C) 97.8 F (36.6 C)  TempSrc: Oral  Oral Oral  SpO2: 98% 100%    Weight:      Height:        Wt Readings from Last 3 Encounters:  05/10/19 120 kg  04/21/18 119.5 kg  02/24/18 123.8 kg     Intake/Output Summary (Last 24 hours) at 05/16/2019 1322 Last data filed at 05/16/2019 1127 Gross per 24 hour  Intake 535.02 ml  Output 1400 ml  Net -864.98 ml     Physical Exam Gen Exam:Alert awake-not in any distress HEENT:atraumatic, normocephalic Chest: B/L clear to auscultation anteriorly CVS:S1S2 regular Abdomen:soft non tender, non distended  Extremities:no edema Neurology: 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 but has more strength in the right upper extremity compared to rest of her limbs Skin: no rash   Data Review:    CBC Recent Labs  Lab 05/10/19 1705  05/12/19 0150 05/13/19 0200 05/14/19 0512 05/15/19 0407 05/16/19 0140  WBC 7.9   < > 8.3 10.1 10.7* 9.9 9.6  HGB 11.6*   < > 9.9* 10.1* 11.1* 10.2* 10.2*  HCT 35.1*   < > 29.9* 30.6* 32.7* 30.2* 29.9*  PLT 145*   < > 173 224 237 232 279  MCV 86.2   < > 85.4 85.2 83.2 82.5 82.6  MCH 28.5   < > 28.3 28.1 28.2 27.9 28.2  MCHC 33.0   < > 33.1 33.0 33.9 33.8 34.1  RDW 15.1   < > 15.4 15.6* 15.1 15.1 14.9  LYMPHSABS 0.5*  --  0.5* 0.4* 0.8 0.7  --   MONOABS 0.5  --  0.3 0.5 0.4 0.7  --   EOSABS 0.0  --  0.0 0.0 0.0 0.0  --   BASOSABS 0.0  --  0.0 0.0 0.2* 0.0  --    < > = values in this interval not displayed.    Chemistries  Recent Labs  Lab 05/12/19 0150 05/13/19 0200 05/14/19 0210 05/15/19 0407 05/16/19 0140  NA 140 140 137 141 142  K 4.1 4.0 4.3 3.6 4.1  CL 108 109 107 106 105  CO2 19* 18* 19* 25 25  GLUCOSE 347* 245* 100* 174* 311*  BUN 30* 30* 27* 34* 39*  CREATININE 1.62* 1.38* 1.23* 1.07* 1.04*  CALCIUM 7.7* 8.6* 8.9 9.0 9.0  MG 1.6* 1.8 2.6* 2.3  --   AST 266* 95* 66* 39 27  ALT 234* 166* 123* 96* 78*  ALKPHOS 391* 358* 330* 304* 267*  BILITOT 1.4* 0.8 1.2 1.1 1.0   ------------------------------------------------------------------------------------------------------------------ No results for input(s): CHOL, HDL, LDLCALC, TRIG, CHOLHDL, LDLDIRECT in the last 72 hours.  Lab Results  Component Value Date   HGBA1C 7.6 (H) 05/10/2019   ------------------------------------------------------------------------------------------------------------------ No results for input(s): TSH, T4TOTAL, T3FREE, THYROIDAB in the last 72 hours.  Invalid input(s): FREET3  ------------------------------------------------------------------------------------------------------------------ Recent Labs    05/16/19 0140  FERRITIN 656*    Coagulation profile Recent Labs  Lab 05/10/19 1452 05/10/19 1956 05/12/19 0150  INR 1.1 1.1 1.0    Recent Labs    05/15/19 0407 05/16/19 0140  DDIMER 1.60* 1.46*    Cardiac Enzymes No results for input(s): CKMB, TROPONINI, MYOGLOBIN in the last 168 hours.  Invalid input(s): CK ------------------------------------------------------------------------------------------------------------------    Component Value Date/Time   BNP 682.7 (H) 05/15/2019 0407    Micro Results Recent Results (from the past 240 hour(s))  Blood Culture (routine x 2)     Status: None   Collection Time: 05/10/19  2:52 PM   Specimen: BLOOD  Result Value Ref Range Status   Specimen Description   Final    BLOOD RIGHT FOREARM Performed at [facility] [address] [place], [place] [address]    Special Requests   Final    BOTTLES DRAWN AEROBIC AND ANAEROBIC Blood Culture results may not be optimal due to an excessive volume of blood received in culture bottles Performed at [facility] [address] [place], [place] [address]    Culture   Final    NO GROWTH 5 DAYS Performed at [facility], [place] [address], [place], [place] [address]    Report Status 05/15/2019 FINAL  Final  Blood Culture (routine x 2)     Status: None   Collection Time: 05/10/19  2:52 PM   Specimen: BLOOD  Result Value Ref Range Status   Specimen Description   Final    BLOOD RIGHT FOREARM Performed at [facility] [address], [place], [place] [address]    Special Requests   Final    BOTTLES DRAWN AEROBIC AND ANAEROBIC Blood Culture results may not be optimal due to an excessive volume of blood received in culture bottles Performed at [facility] [address],  [place], [place] [address]    Culture   Final    NO GROWTH 5 DAYS Performed at [facility], [place] [address], [place], [place] [address]    Report Status 05/15/2019 FINAL  Final  SARS CORONAVIRUS 2 (TAT 6-24  HRS) Nasopharyngeal Nasopharyngeal Swab     Status: Abnormal   Collection Time: 05/10/19  5:40 PM   Specimen: Nasopharyngeal Swab  Result Value Ref Range Status   SARS Coronavirus 2 POSITIVE (A) NEGATIVE Final    Comment: RESULT CALLED TO, READ BACK BY AND VERIFIED WITH: Helane Rima, RN AT [phone] ON 05/11/2019 BY SAINVILUS S (NOTE) SARS-CoV-2 target nucleic acids are DETECTED. The SARS-CoV-2 RNA is generally detectable in upper and lower respiratory specimens during the acute phase of infection. Positive results are indicative of active infection with SARS-CoV-2. Clinical  correlation with patient history and other diagnostic information is necessary to determine patient infection status. Positive results do  not rule out bacterial infection or co-infection with other viruses. The expected result is Negative. Fact Sheet for Patients: SugarRoll.be Fact Sheet for Healthcare Providers: https://www.woods-mathews.com/ This test is not yet approved or cleared by the [place] FDA and  has been authorized for detection and/or diagnosis of SARS-CoV-2 by FDA under an Emergency Use Authorization (EUA). This EUA will remain  in effect (meaning this test  can be used) for the duration of the COVID-19 declaration under Section 564(b)(1) of the Act, 21 U.S.C. section 360bbb-3(b)(1), unless the authorization is terminated or revoked sooner. Performed at [facility], [place] [address], [place], [place] [address]   Urine culture     Status: Abnormal   Collection Time: 05/10/19 11:50 PM   Specimen: In/Out Cath Urine  Result Value Ref Range Status   Specimen Description   Final    IN/OUT CATH URINE Performed at [facility] [address] [place], [place] [address]    Special Requests   Final    NONE Performed at [facility], [place] [address], [place], [place] [address]    Culture (A)  Final    40,000 COLONIES/mL KLEBSIELLA PNEUMONIAE Confirmed Extended Spectrum Beta-Lactamase Producer (ESBL).  In bloodstream infections from ESBL organisms, carbapenems are preferred over piperacillin/tazobactam. They are shown to have a lower risk of mortality.    Report Status 05/12/2019 FINAL  Final   Organism ID, Bacteria KLEBSIELLA PNEUMONIAE (A)  Final      Susceptibility   Klebsiella pneumoniae - MIC*    AMPICILLIN >=32 RESISTANT Resistant     CEFAZOLIN >=64 RESISTANT Resistant     CEFTRIAXONE >=64 RESISTANT Resistant     CIPROFLOXACIN >=4 RESISTANT Resistant     GENTAMICIN >=16 RESISTANT Resistant     IMIPENEM <=0.25 SENSITIVE Sensitive     NITROFURANTOIN 64 INTERMEDIATE Intermediate     TRIMETH/SULFA >=320 RESISTANT Resistant     AMPICILLIN/SULBACTAM >=32 RESISTANT Resistant     PIP/TAZO 16 SENSITIVE Sensitive     Extended ESBL POSITIVE Resistant     * 40,000 COLONIES/mL KLEBSIELLA PNEUMONIAE    Radiology Reports Dg Chest Port 1 View  Result Date: 05/10/2019 CLINICAL DATA:  Patient arrived from [place]. Patient facility called due to patient being more lethargic than normal. H/o Covid-19. Cardiomyopathy, Chronic Systolic Heart Failure, HTN, MS. Former smoker. EXAM: PORTABLE CHEST 1 VIEW COMPARISON:  Chest radiograph 11/07/2015 FINDINGS: Stable cardiomediastinal contours. There are subtle airspace opacities throughout the right lung. Small linear opacities at the left base likely reflect atelectasis. No pneumothorax or large pleural effusion. No acute finding in the visualized skeleton. IMPRESSION: Asymmetric hazy airspace opacities throughout the right lung raise concern for infection versus asymmetric edema. Minimal linear opacities at the left base likely atelectasis. Electronically  Signed   By: Audie Pinto  M.D.   On: 05/10/2019 16:15

## 2019-05-17 ENCOUNTER — Inpatient Hospital Stay: Payer: Self-pay

## 2019-05-17 LAB — CBC
HCT: 31.2 % — ABNORMAL LOW (ref 36.0–46.0)
Hemoglobin: 10.3 g/dL — ABNORMAL LOW (ref 12.0–15.0)
MCH: 27.8 pg (ref 26.0–34.0)
MCHC: 33 g/dL (ref 30.0–36.0)
MCV: 84.1 fL (ref 80.0–100.0)
Platelets: 290 10*3/uL (ref 150–400)
RBC: 3.71 MIL/uL — ABNORMAL LOW (ref 3.87–5.11)
RDW: 15.2 % (ref 11.5–15.5)
WBC: 9.6 10*3/uL (ref 4.0–10.5)
nRBC: 0.3 % — ABNORMAL HIGH (ref 0.0–0.2)

## 2019-05-17 LAB — COMPREHENSIVE METABOLIC PANEL
ALT: 68 U/L — ABNORMAL HIGH (ref 0–44)
AST: 33 U/L (ref 15–41)
Albumin: 2.7 g/dL — ABNORMAL LOW (ref 3.5–5.0)
Alkaline Phosphatase: 259 U/L — ABNORMAL HIGH (ref 38–126)
Anion gap: 11 (ref 5–15)
BUN: 34 mg/dL — ABNORMAL HIGH (ref 6–20)
CO2: 25 mmol/L (ref 22–32)
Calcium: 9.1 mg/dL (ref 8.9–10.3)
Chloride: 104 mmol/L (ref 98–111)
Creatinine, Ser: 0.86 mg/dL (ref 0.44–1.00)
GFR calc Af Amer: >60 mL/min (ref >60)
GFR calc non Af Amer: >60 mL/min (ref >60)
Glucose, Bld: 241 mg/dL — ABNORMAL HIGH (ref 70–99)
Potassium: 4.3 mmol/L (ref 3.5–5.1)
Sodium: 140 mmol/L (ref 135–145)
Total Bilirubin: 0.9 mg/dL (ref 0.3–1.2)
Total Protein: 6.1 g/dL — ABNORMAL LOW (ref 6.5–8.1)

## 2019-05-17 LAB — GLUCOSE, CAPILLARY
Glucose-Capillary: 263 mg/dL — ABNORMAL HIGH (ref 70–99)
Glucose-Capillary: 309 mg/dL — ABNORMAL HIGH (ref 70–99)
Glucose-Capillary: 174 mg/dL — ABNORMAL HIGH (ref 70–99)

## 2019-05-17 LAB — C-REACTIVE PROTEIN: CRP: 7.9 mg/dL — ABNORMAL HIGH (ref <1.0)

## 2019-05-17 LAB — D-DIMER, QUANTITATIVE: D-Dimer, Quant: 1 ug/mL-FEU — ABNORMAL HIGH (ref 0.00–0.50)

## 2019-05-17 LAB — FERRITIN: Ferritin: 577 ng/mL — ABNORMAL HIGH (ref 11–307)

## 2019-05-17 MED ORDER — FUROSEMIDE 10 MG/ML IJ SOLN
40.0000 mg | Freq: Once | INTRAMUSCULAR | Status: AC
  Administered 2019-05-17: 40 mg via INTRAVENOUS
  Filled 2019-05-17: qty 4

## 2019-05-17 MED ORDER — INSULIN GLARGINE 100 UNIT/ML ~~LOC~~ SOLN
16.0000 [IU] | Freq: Every day | SUBCUTANEOUS | Status: DC
  Administered 2019-05-18: 16 [IU] via SUBCUTANEOUS
  Filled 2019-05-17: qty 0.16

## 2019-05-17 MED ORDER — METHYLPREDNISOLONE SODIUM SUCC 40 MG IJ SOLR
40.0000 mg | Freq: Two times a day (BID) | INTRAMUSCULAR | Status: DC
  Administered 2019-05-17 – 2019-05-18 (×2): 40 mg via INTRAVENOUS
  Filled 2019-05-17 (×2): qty 1

## 2019-05-17 MED ORDER — INSULIN ASPART 100 UNIT/ML ~~LOC~~ SOLN
4.0000 [IU] | Freq: Three times a day (TID) | SUBCUTANEOUS | Status: DC
  Administered 2019-05-17 – 2019-05-18 (×4): 4 [IU] via SUBCUTANEOUS

## 2019-05-17 MED ORDER — SODIUM CHLORIDE 0.9 % IV SOLN
100.0000 mg | INTRAVENOUS | Status: AC
  Filled 2019-05-17: qty 20

## 2019-05-17 NOTE — Progress Notes (Signed)
Spoke with Marge re PICC order.  States PIV is running slowly at this time.  Notified PICC may not be completed today.

## 2019-05-17 NOTE — Progress Notes (Addendum)
PROGRESS NOTE                                                                                                                                                                                                             Patient Demographics:    Shannon Garrett, is a 58 y.o. female, DOB - May 11, 1961, [id number]  Outpatient Primary MD for the patient is Gildardo Cranker, DO   Admit date - 05/10/2019   LOS - 7  Chief Complaint  Patient presents with  . COVID       Brief Narrative: Patient is a 58 y.o. female with PMHx of multiple sclerosis (bed to wheelchair bound) HTN, neurogenic bladder with history of recurrent UTIs-resident of SNF-presented to the hospital with lethargy, hypotension-subsequently thought to have sepsis with AKI secondary to Covid 19 pneumonia and Klebsiella ESBL UTI.  See below for further details.   Subjective:    Shannon Garrett today lying comfortably in bed-has been weaned to room air.   Assessment  & Plan :   Sepsis secondary to ESBL Klebsiella complicated UTI and [id number] pneumonia: Sepsis physiology has resolved, on meropenem (day 6/7)-will finish her course of remdesivir today. Will slowly taper steroids starting today. Much improved.  Continue to follow and provide maximal supportive care.   Fever: afebrile  O2 requirements: Room air  COVID-19 Labs: Recent Labs    05/15/19 0407 05/16/19 0140 05/17/19 0411  DDIMER 1.60* 1.46* 1.00*  FERRITIN  --  656* 577*  CRP 20.1* 13.8* 7.9*    Lab Results  Component Value Date   SARSCOV2NAA POSITIVE (A) 05/10/2019     COVID-19 Medications: Steroids:11/8>> Remdesivir:11/11>>11/15 Actemra: Not given-probably contraindicated due to sepsis from ESBL Klebsiella UTI. Convalescent Plasma: Not indicated given relative clinical stability from COVID-19. Research Studies:N/A  Other medications: Diuretics: Euvolemic-repeat Lasix x 1 today-maintain neg  balance    Antibiotics: See below  Prone/Incentive Spirometry: encouraged incentive spirometry use 3-4/hour  DVT Prophylaxis  : Lovenox  Mild acute hypoxic respiratory failure: Secondary to COVID-19 pneumonia-has resolved-titrated down to room air today  Transaminitis: Secondary to COVID-19-slowly improving-once ALT was consistently below 250-remdesivir was started on 11/11.-Continue to downtrend-follow periodically.    Multiple sclerosis: Appears advanced-only able to move her right upper extremity-rest of extremities are very weak from MS.  She is bed to wheelchair bound-and has not ambulated any  years.  DM-2 (A1c 7.6): CBGs remain elevated-increase Lantus to 16 units, begin scheduled 4 units of Novolog with meals. Suspect as steroid being tapered down, her insulin requirement will improve.    CBG (last 3)  Recent Labs    05/16/19 1648 05/16/19 2129 05/17/19 0745  GLUCAP 359* 257* 263*    GERD: Continue PPI  Insomnia/fatigue/chronic pain syndrome: Suspect all related to MS-continue amantadine, Neurontin  Obesity: Estimated body mass index is 44.02 kg/m as calculated from the following:   Height as of this encounter: 5\' 5"  (1.651 m).   Weight as of this encounter: 120 kg.   ABG:    Component Value Date/Time   PHART 7.420 07/10/2015 1801   PCO2ART 41.2 07/10/2015 1801   PO2ART 417.0 (H) 07/10/2015 1801   HCO3 26.7 (H) 07/10/2015 1801   TCO2 28 07/10/2015 1801   O2SAT 100.0 07/10/2015 1801    Vent Settings: N/A   Condition -  Guarded  Family Communication : Spoke with patient's sister over the phone on 11/15  Code Status :  DNR  Diet :  Diet Order            DIET DYS 3 Room service appropriate? Yes; Fluid consistency: Thin  Diet effective now               Disposition Plan  :  Remain hospitalized  Barriers to discharge: complete 5 days of IV Remdesivir  Consults  : Palliative care  Procedures  :  None  Antibiotics  :    Anti-infectives (From  admission, onward)   Start     Dose/Rate Route Frequency Ordered Stop   05/14/19 1000  remdesivir 100 mg in sodium chloride 0.9 % 250 mL IVPB     100 mg 500 mL/hr over 30 Minutes Intravenous Every 24 hours 05/13/19 0815 05/18/19 0959   05/13/19 1000  remdesivir 200 mg in sodium chloride 0.9 % 250 mL IVPB     200 mg 500 mL/hr over 30 Minutes Intravenous Once 05/13/19 0815 05/13/19 1108   05/12/19 1330  meropenem (MERREM) 1 g in sodium chloride 0.9 % 100 mL IVPB     1 g 200 mL/hr over 30 Minutes Intravenous Every 8 hours 05/12/19 1243 05/19/19 1359   05/11/19 2000  vancomycin (VANCOCIN) IVPB 1000 mg/200 mL premix  Status:  Discontinued     1,000 mg 200 mL/hr over 60 Minutes Intravenous Every 36 hours 05/10/19 2057 05/10/19 2112   05/11/19 2000  vancomycin (VANCOCIN) 1,250 mg in sodium chloride 0.9 % 250 mL IVPB  Status:  Discontinued     1,250 mg 166.7 mL/hr over 90 Minutes Intravenous Every 36 hours 05/10/19 2112 05/11/19 1118   05/11/19 0600  ceFEPIme (MAXIPIME) 2 g in sodium chloride 0.9 % 100 mL IVPB  Status:  Discontinued     2 g 200 mL/hr over 30 Minutes Intravenous Every 12 hours 05/10/19 2057 05/12/19 0955   05/10/19 1445  vancomycin (VANCOCIN) 2,000 mg in sodium chloride 0.9 % 500 mL IVPB     2,000 mg 250 mL/hr over 120 Minutes Intravenous  Once 05/10/19 1438 05/10/19 2009   05/10/19 1445  ceFEPIme (MAXIPIME) 2 g in sodium chloride 0.9 % 100 mL IVPB     2 g 200 mL/hr over 30 Minutes Intravenous  Once 05/10/19 1442 05/10/19 1615   05/10/19 1415  aztreonam (AZACTAM) 2 g in sodium chloride 0.9 % 100 mL IVPB  Status:  Discontinued     2 g 200 mL/hr over  30 Minutes Intravenous  Once 05/10/19 1414 05/10/19 1440   05/10/19 1415  metroNIDAZOLE (FLAGYL) IVPB 500 mg     500 mg 100 mL/hr over 60 Minutes Intravenous  Once 05/10/19 1414 05/10/19 1615   05/10/19 1415  vancomycin (VANCOCIN) IVPB 1000 mg/200 mL premix  Status:  Discontinued     1,000 mg 200 mL/hr over 60 Minutes Intravenous   Once 05/10/19 1414 05/10/19 1438      Inpatient Medications  Scheduled Meds: . acidophilus  1 capsule Oral QODAY  . amantadine  100 mg Oral Daily  . B-complex with vitamin C  1 tablet Oral Daily  . buPROPion  150 mg Oral QPC breakfast  . enoxaparin (LOVENOX) injection  0.5 mg/kg Subcutaneous Q24H  . gabapentin  400 mg Oral BID  . insulin aspart  0-20 Units Subcutaneous TID WC  . insulin glargine  12 Units Subcutaneous Daily  . Melatonin  2 tablet Oral QHS  . methylPREDNISolone (SOLU-MEDROL) injection  60 mg Intravenous Q12H  . mirabegron ER  50 mg Oral QHS  . multivitamin with minerals  1 tablet Oral Daily  . pantoprazole  40 mg Oral Daily  . ascorbic acid  500 mg Oral BID  . zinc sulfate  220 mg Oral Daily   Continuous Infusions: . meropenem (MERREM) IV 1 g (05/17/19 0546)  . remdesivir 100 mg in NS 250 mL Stopped (05/16/19 1100)   PRN Meds:.acetaminophen **OR** acetaminophen, acetaminophen, guaiFENesin-dextromethorphan, [DISCONTINUED] ondansetron **OR** ondansetron (ZOFRAN) IV, traMADol, zolpidem   Time Spent in minutes  25  See all Orders from today for further details   Oren Binet M.D on 05/17/2019 at 10:12 AM  To page go to [email or web address] - use universal password  [facility] -  Office  [phone]    Objective:   Vitals:   05/17/19 0400 05/17/19 0541 05/17/19 0600 05/17/19 0846  BP:  128/83    Pulse: 90 90 (!) 101 98  Resp:  18  20  Temp:  97.7 F (36.5 C)  97.8 F (36.6 C)  TempSrc:  Oral  Oral  SpO2:  91% 91% 91%  Weight:      Height:        Wt Readings from Last 3 Encounters:  05/10/19 120 kg  04/21/18 119.5 kg  02/24/18 123.8 kg     Intake/Output Summary (Last 24 hours) at 05/17/2019 1012 Last data filed at 05/17/2019 0919 Gross per 24 hour  Intake 1320.48 ml  Output 1225 ml  Net 95.48 ml     Physical Exam Gen Exam:Alert awake-not in any distress HEENT:atraumatic, normocephalic Chest: B/L clear to auscultation  anteriorly CVS:S1S2 regular Abdomen:soft non tender, non distended Extremities:no edema 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 Skin: no rash   Data Review:    CBC Recent Labs  Lab 05/10/19 1705  05/12/19 0150 05/13/19 0200 05/14/19 0512 05/15/19 0407 05/16/19 0140 05/17/19 0411  WBC 7.9   < > 8.3 10.1 10.7* 9.9 9.6 9.6  HGB 11.6*   < > 9.9* 10.1* 11.1* 10.2* 10.2* 10.3*  HCT 35.1*   < > 29.9* 30.6* 32.7* 30.2* 29.9* 31.2*  PLT 145*   < > 173 224 237 232 279 290  MCV 86.2   < > 85.4 85.2 83.2 82.5 82.6 84.1  MCH 28.5   < > 28.3 28.1 28.2 27.9 28.2 27.8  MCHC 33.0   < > 33.1 33.0 33.9 33.8 34.1 33.0  RDW 15.1   < > 15.4 15.6* 15.1 15.1 14.9 15.2  LYMPHSABS 0.5*  --  0.5* 0.4* 0.8 0.7  --   --   MONOABS 0.5  --  0.3 0.5 0.4 0.7  --   --   EOSABS 0.0  --  0.0 0.0 0.0 0.0  --   --   BASOSABS 0.0  --  0.0 0.0 0.2* 0.0  --   --    < > = values in this interval not displayed.    Chemistries  Recent Labs  Lab 05/12/19 0150 05/13/19 0200 05/14/19 0210 05/15/19 0407 05/16/19 0140 05/17/19 0411  NA 140 140 137 141 142 140  K 4.1 4.0 4.3 3.6 4.1 4.3  CL 108 109 107 106 105 104  CO2 19* 18* 19* 25 25 25   GLUCOSE 347* 245* 100* 174* 311* 241*  BUN 30* 30* 27* 34* 39* 34*  CREATININE 1.62* 1.38* 1.23* 1.07* 1.04* 0.86  CALCIUM 7.7* 8.6* 8.9 9.0 9.0 9.1  MG 1.6* 1.8 2.6* 2.3  --   --   AST 266* 95* 66* 39 27 33  ALT 234* 166* 123* 96* 78* 68*  ALKPHOS 391* 358* 330* 304* 267* 259*  BILITOT 1.4* 0.8 1.2 1.1 1.0 0.9   ------------------------------------------------------------------------------------------------------------------ No results for input(s): CHOL, HDL, LDLCALC, TRIG, CHOLHDL, LDLDIRECT in the last 72 hours.  Lab Results  Component Value Date   HGBA1C 7.6 (H) 05/10/2019   ------------------------------------------------------------------------------------------------------------------ No results for input(s): TSH, T4TOTAL, T3FREE, THYROIDAB in the last 72 hours.   Invalid input(s): FREET3 ------------------------------------------------------------------------------------------------------------------ Recent Labs    05/16/19 0140 05/17/19 0411  FERRITIN 656* 577*    Coagulation profile Recent Labs  Lab 05/10/19 1452 05/10/19 1956 05/12/19 0150  INR 1.1 1.1 1.0    Recent Labs    05/16/19 0140 05/17/19 0411  DDIMER 1.46* 1.00*    Cardiac Enzymes No results for input(s): CKMB, TROPONINI, MYOGLOBIN in the last 168 hours.  Invalid input(s): CK ------------------------------------------------------------------------------------------------------------------    Component Value Date/Time   BNP 682.7 (H) 05/15/2019 0407    Micro Results Recent Results (from the past 240 hour(s))  Blood Culture (routine x 2)     Status: None   Collection Time: 05/10/19  2:52 PM   Specimen: BLOOD  Result Value Ref Range Status   Specimen Description   Final    BLOOD RIGHT FOREARM Performed at [facility] [address], [place], [place] [address]    Special Requests   Final    BOTTLES DRAWN AEROBIC AND ANAEROBIC Blood Culture results may not be optimal due to an excessive volume of blood received in culture bottles Performed at [facility] [address] [place], [place] [address]    Culture   Final    NO GROWTH 5 DAYS Performed at [facility], [place] [address], [place], [place] [address]    Report Status 05/15/2019 FINAL  Final  Blood Culture (routine x 2)     Status: None   Collection Time: 05/10/19  2:52 PM   Specimen: BLOOD  Result Value Ref Range Status   Specimen Description   Final    BLOOD RIGHT FOREARM Performed at [facility] [address] [place], [place] [address]    Special Requests   Final    BOTTLES DRAWN AEROBIC AND ANAEROBIC Blood Culture results may not be optimal due to an excessive volume of blood received in culture bottles Performed at [facility] [address], [place],  [address]    Culture   Final  NO GROWTH 5 DAYS Performed at [facility], [place] [address] [place], [place] [address]    Report Status 05/15/2019 FINAL  Final  SARS CORONAVIRUS 2 (TAT 6-24 HRS) Nasopharyngeal Nasopharyngeal Swab     Status: Abnormal   Collection Time: 05/10/19  5:40 PM   Specimen: Nasopharyngeal Swab  Result Value Ref Range Status   SARS Coronavirus 2 POSITIVE (A) NEGATIVE Final    Comment: RESULT CALLED TO, READ BACK BY AND VERIFIED WITH: Helane Rima, RN AT [phone] ON 05/11/2019 BY SAINVILUS S (NOTE) SARS-CoV-2 target nucleic acids are DETECTED. The SARS-CoV-2 RNA is generally detectable in upper and lower respiratory specimens during the acute phase of infection. Positive results are indicative of active infection with SARS-CoV-2. Clinical  correlation with patient history and other diagnostic information is necessary to determine patient infection status. Positive results do  not rule out bacterial infection or co-infection with other viruses. The expected result is Negative. Fact Sheet for Patients: SugarRoll.be Fact Sheet for Healthcare Providers: https://www.woods-mathews.com/ This test is not yet approved or cleared by the [place] FDA and  has been authorized for detection and/or diagnosis of SARS-CoV-2 by FDA under an Emergency Use Authorization (EUA). This EUA will remain  in effect (meaning this test  can be used) for the duration of the COVID-19 declaration under Section 564(b)(1) of the Act, 21 U.S.C. section 360bbb-3(b)(1), unless the authorization is terminated or revoked sooner. Performed at [facility], [place] [address] [place], [place] [address]   Urine culture     Status: Abnormal   Collection Time: 05/10/19 11:50 PM   Specimen: In/Out Cath Urine  Result Value Ref Range Status   Specimen Description   Final    IN/OUT CATH  URINE Performed at [facility] [address], [place], [place] [address]    Special Requests   Final    NONE Performed at [facility], [place] [address], [place], [place] [address]    Culture (A)  Final    40,000 COLONIES/mL KLEBSIELLA PNEUMONIAE Confirmed Extended Spectrum Beta-Lactamase Producer (ESBL).  In bloodstream infections from ESBL organisms, carbapenems are preferred over piperacillin/tazobactam. They are shown to have a lower risk of mortality.    Report Status 05/12/2019 FINAL  Final   Organism ID, Bacteria KLEBSIELLA PNEUMONIAE (A)  Final      Susceptibility   Klebsiella pneumoniae - MIC*    AMPICILLIN >=32 RESISTANT Resistant     CEFAZOLIN >=64 RESISTANT Resistant     CEFTRIAXONE >=64 RESISTANT Resistant     CIPROFLOXACIN >=4 RESISTANT Resistant     GENTAMICIN >=16 RESISTANT Resistant     IMIPENEM <=0.25 SENSITIVE Sensitive     NITROFURANTOIN 64 INTERMEDIATE Intermediate     TRIMETH/SULFA >=320 RESISTANT Resistant     AMPICILLIN/SULBACTAM >=32 RESISTANT Resistant     PIP/TAZO 16 SENSITIVE Sensitive     Extended ESBL POSITIVE Resistant     * 40,000 COLONIES/mL KLEBSIELLA PNEUMONIAE    Radiology Reports Dg Chest Port 1 View  Result Date: 05/10/2019 CLINICAL DATA:  Patient arrived from [place]. Patient facility called due to patient being more lethargic than normal. H/o Covid-19. Cardiomyopathy, Chronic Systolic Heart Failure, HTN, MS. Former smoker. EXAM: PORTABLE CHEST 1 VIEW COMPARISON:  Chest radiograph 11/07/2015 FINDINGS: Stable cardiomediastinal contours. There are subtle airspace opacities throughout the right lung. Small linear opacities at the left base likely reflect atelectasis. No pneumothorax or large pleural effusion. No acute finding in the visualized skeleton. IMPRESSION: Asymmetric  hazy airspace opacities throughout the right lung raise concern for infection versus asymmetric edema. Minimal linear opacities at  the left base likely atelectasis. Electronically Signed   By: Audie Pinto M.D.   On: 05/10/2019 16:15

## 2019-05-18 DIAGNOSIS — J1289 Other viral pneumonia: Secondary | ICD-10-CM

## 2019-05-18 LAB — COMPREHENSIVE METABOLIC PANEL
ALT: 80 U/L — ABNORMAL HIGH (ref 0–44)
AST: 51 U/L — ABNORMAL HIGH (ref 15–41)
Albumin: 2.7 g/dL — ABNORMAL LOW (ref 3.5–5.0)
Alkaline Phosphatase: 255 U/L — ABNORMAL HIGH (ref 38–126)
Anion gap: 11 (ref 5–15)
BUN: 36 mg/dL — ABNORMAL HIGH (ref 6–20)
CO2: 29 mmol/L (ref 22–32)
Calcium: 9.3 mg/dL (ref 8.9–10.3)
Chloride: 103 mmol/L (ref 98–111)
Creatinine, Ser: 0.92 mg/dL (ref 0.44–1.00)
GFR calc Af Amer: >60 mL/min (ref >60)
GFR calc non Af Amer: >60 mL/min (ref >60)
Glucose, Bld: 249 mg/dL — ABNORMAL HIGH (ref 70–99)
Potassium: 4.1 mmol/L (ref 3.5–5.1)
Sodium: 143 mmol/L (ref 135–145)
Total Bilirubin: 0.6 mg/dL (ref 0.3–1.2)
Total Protein: 6.1 g/dL — ABNORMAL LOW (ref 6.5–8.1)

## 2019-05-18 LAB — D-DIMER, QUANTITATIVE: D-Dimer, Quant: 0.92 ug/mL-FEU — ABNORMAL HIGH (ref 0.00–0.50)

## 2019-05-18 LAB — CBC
HCT: 33.4 % — ABNORMAL LOW (ref 36.0–46.0)
Hemoglobin: 11 g/dL — ABNORMAL LOW (ref 12.0–15.0)
MCH: 27.8 pg (ref 26.0–34.0)
MCHC: 32.9 g/dL (ref 30.0–36.0)
MCV: 84.6 fL (ref 80.0–100.0)
Platelets: 358 10*3/uL (ref 150–400)
RBC: 3.95 MIL/uL (ref 3.87–5.11)
RDW: 15.6 % — ABNORMAL HIGH (ref 11.5–15.5)
WBC: 10.3 10*3/uL (ref 4.0–10.5)
nRBC: 0.7 % — ABNORMAL HIGH (ref 0.0–0.2)

## 2019-05-18 LAB — FERRITIN: Ferritin: 650 ng/mL — ABNORMAL HIGH (ref 11–307)

## 2019-05-18 LAB — GLUCOSE, CAPILLARY
Glucose-Capillary: 200 mg/dL — ABNORMAL HIGH (ref 70–99)
Glucose-Capillary: 218 mg/dL — ABNORMAL HIGH (ref 70–99)

## 2019-05-18 LAB — C-REACTIVE PROTEIN: CRP: 5 mg/dL — ABNORMAL HIGH (ref <1.0)

## 2019-05-18 MED ORDER — SODIUM CHLORIDE 0.9 % IV SOLN
1.0000 g | Freq: Once | INTRAVENOUS | Status: AC
  Administered 2019-05-18: 14:00:00 1000 mg via INTRAVENOUS
  Filled 2019-05-18: qty 1

## 2019-05-18 MED ORDER — NOVOLOG FLEXPEN 100 UNIT/ML ~~LOC~~ SOPN: PEN_INJECTOR | SUBCUTANEOUS | 0 refills | Status: AC

## 2019-05-18 MED ORDER — INSULIN GLARGINE 100 UNIT/ML ~~LOC~~ SOLN: 10.0000 [IU] | Freq: Every day | SUBCUTANEOUS | 11 refills | Status: DC

## 2019-05-18 MED ORDER — PREDNISONE 10 MG PO TABS: ORAL_TABLET | ORAL | 0 refills | Status: DC

## 2019-05-18 MED ORDER — TRAMADOL HCL 50 MG PO TABS: 50.0000 mg | ORAL_TABLET | Freq: Three times a day (TID) | ORAL | 0 refills | Status: DC | PRN

## 2019-05-18 NOTE — Discharge Summary (Signed)
PATIENT DETAILS Name: Shannon Garrett Age: 58 y.o. Sex: female Date of Birth: [birth year] MRN: [id number]. Admitting Physician: Verlee Monte, MD [id number], Brayton Layman, DO  Admit Date: 05/10/2019 Discharge date: 05/18/2019  Recommendations for Outpatient Follow-up:  1. Follow up with PCP in 1-2 weeks 2. Please obtain CMP/CBC in one week 3. Repeat Chest Xray in 4-6 week  Admitted From:  SNF  Disposition: SNF   Home Health: Yes  Equipment/Devices: None  Discharge Condition: Stable  CODE STATUS: DNR  Diet recommendation:  Diet Order            Diet - low sodium heart healthy        Diet Carb Modified        DIET DYS 3 Room service appropriate? Yes; Fluid consistency: Thin  Diet effective now               Brief Summary: Patient is a 58 y.o. female with PMHx of multiple sclerosis (bed to wheelchair bound) HTN, neurogenic bladder with history of recurrent UTIs-resident of SNF-presented to the hospital with lethargy, hypotension-subsequently thought to have sepsis with AKI secondary to Covid 19 pneumonia and Klebsiella ESBL UTI.  See below for further details  Brief Hospital Course: Sepsis secondary to ESBL Klebsiella complicated UTI and [id number] pneumonia: Sepsis physiology has resolved, will complete meropenem-day 7 today.  Has finished a course of remdesivir.  Will slowly taper off steroids.  Has been on room air for the past couple of days-and significantly improved.  Stable for discharge back to SNF today.   COVID-19 Labs:  Recent Labs    05/16/19 0140 05/17/19 0411 05/18/19 0451  DDIMER 1.46* 1.00* 0.92*  FERRITIN 656* 577* 650*  CRP 13.8* 7.9* 5.0*    Lab Results  Component Value Date   SARSCOV2NAA POSITIVE (A) 05/10/2019     COVID-19 Medications: Steroids:11/8>> Remdesivir:11/11>>11/15 Actemra: Not given-probably contraindicated due to sepsis from ESBL Klebsiella UTI. Convalescent Plasma: Not indicated given relative clinical stability from  COVID-19. Research Studies:N/A  Mild acute hypoxic respiratory failure: Secondary to COVID-19 pneumonia-has resolved-titrated down to room air for the past 2 days.  Transaminitis: Secondary to COVID-19-slowly improving-once ALT was consistently below 250-remdesivir was started on 11/11.Continue to downtrend-follow periodically.    Multiple sclerosis: Appears advanced-only able to move her right upper extremity-rest of extremities are very weak from MS.  She is bed to wheelchair bound-and has not ambulated any years.  DM-2 (A1c 7.6): CBGs better controlled-they were elevated due to steroid use-we will decrease Lantus to 10 units, continue SSI-as steroid dosage being tapered down.  Please follow it SNF and optimize regimen accordingly.  May not require long-acting insulin when she completes a course of steroids.   CBG (last 3)  Recent Labs    05/17/19 1137 05/17/19 2113 05/18/19 0731  GLUCAP 309* 174* 200*   GERD: Continue PPI  Insomnia/fatigue/chronic pain syndrome: Suspect all related to MS-continue amantadine, Neurontin  Obesity: Estimated body mass index is 44.02 kg/m as calculated from the following:   Height as of this encounter: 5\' 5"  (1.651 m).   Weight as of this encounter: 120 kg.    RN pressure injury documentation: Pressure Injury 05/11/19 Buttocks Left;Posterior Stage II -  Partial thickness loss of dermis presenting as a shallow open ulcer with a red, pink wound bed without slough. stage two in the crease of left bottucks (Active)  05/11/19 1015  Location: Buttocks  Location Orientation: Left;Posterior  Staging: Stage II -  Partial thickness loss of dermis presenting as  a shallow open ulcer with a red, pink wound bed without slough.  Wound Description (Comments): stage two in the crease of left bottucks  Present on Admission: Yes     Procedures/Studies: None  Discharge Diagnoses:  Active Problems:   MS (multiple sclerosis) ([place])   Adult failure to  thrive   Sepsis [facility])   Palliative care by specialist   DNR (do not resuscitate)   Discharge Instructions:    Person Under Monitoring Name: Shannon Garrett  Location: [place] [id number]   Infection Prevention Recommendations for Individuals Confirmed to have, or Being Evaluated for, 2019 Novel Coronavirus (COVID-19) Infection Who Receive Care at Home  Individuals who are confirmed to have, or are being evaluated for, COVID-19 should follow the prevention steps below until a healthcare provider or local or state health department says they can return to normal activities.  Stay home except to get medical care You should restrict activities outside your home, except for getting medical care. Do not go to work, school, or public areas, and do not use public transportation or taxis.  Call ahead before visiting your doctor Before your medical appointment, call the healthcare provider and tell them that you have, or are being evaluated for, COVID-19 infection. This will help the healthcare providers office take steps to keep other people from getting infected. Ask your healthcare provider to call the local or state health department.  Monitor your symptoms Seek prompt medical attention if your illness is worsening (e.g., difficulty breathing). Before going to your medical appointment, call the healthcare provider and tell them that you have, or are being evaluated for, COVID-19 infection. Ask your healthcare provider to call the local or state health department.  Wear a facemask You should wear a facemask that covers your nose and mouth when you are in the same room with other people and when you visit a healthcare provider. People who live with or visit you should also wear a facemask while they are in the same room with you.  Separate yourself from other people in your home As much as possible, you should stay in a different room from other people in your  home. Also, you should use a separate bathroom, if available.  Avoid sharing household items You should not share dishes, drinking glasses, cups, eating utensils, towels, bedding, or other items with other people in your home. After using these items, you should wash them thoroughly with soap and water.  Cover your coughs and sneezes Cover your mouth and nose with a tissue when you cough or sneeze, or you can cough or sneeze into your sleeve. Throw used tissues in a lined trash can, and immediately wash your hands with soap and water for at least 20 seconds or use an alcohol-based hand rub.  Wash your [facility] your hands often and thoroughly with soap and water for at least 20 seconds. You can use an alcohol-based hand sanitizer if soap and water are not available and if your hands are not visibly dirty. Avoid touching your eyes, nose, and mouth with unwashed hands.   Prevention Steps for Caregivers and Household Members of Individuals Confirmed to have, or Being Evaluated for, COVID-19 Infection Being Cared for in the Home  If you live with, or provide care at home for, a person confirmed to have, or being evaluated for, COVID-19 infection please follow these guidelines to prevent infection:  Follow healthcare providers instructions Make sure that you understand and can help the  patient follow any healthcare provider instructions for all care.  Provide for the patients basic needs You should help the patient with basic needs in the home and provide support for getting groceries, prescriptions, and other personal needs.  Monitor the patients symptoms If they are getting sicker, call his or her medical provider and tell them that the patient has, or is being evaluated for, COVID-19 infection. This will help the healthcare providers office take steps to keep other people from getting infected. Ask the healthcare provider to call the local or state health department.  Limit the  number of people who have contact with the patient  If possible, have only one caregiver for the patient.  Other household members should stay in another home or place of residence. If this is not possible, they should stay  in another room, or be separated from the patient as much as possible. Use a separate bathroom, if available.  Restrict visitors who do not have an essential need to be in the home.  Keep older adults, very young children, and other sick people away from the patient Keep older adults, very young children, and those who have compromised immune systems or chronic health conditions away from the patient. This includes people with chronic heart, lung, or kidney conditions, diabetes, and cancer.  Ensure good ventilation Make sure that shared spaces in the home have good air flow, such as from an air conditioner or an opened window, weather permitting.  Wash your hands often  Wash your hands often and thoroughly with soap and water for at least 20 seconds. You can use an alcohol based hand sanitizer if soap and water are not available and if your hands are not visibly dirty.  Avoid touching your eyes, nose, and mouth with unwashed hands.  Use disposable paper towels to dry your hands. If not available, use dedicated cloth towels and replace them when they become wet.  Wear a facemask and gloves  Wear a disposable facemask at all times in the room and gloves when you touch or have contact with the patients blood, body fluids, and/or secretions or excretions, such as sweat, saliva, sputum, nasal mucus, vomit, urine, or feces.  Ensure the mask fits over your nose and mouth tightly, and do not touch it during use.  Throw out disposable facemasks and gloves after using them. Do not reuse.  Wash your hands immediately after removing your facemask and gloves.  If your personal clothing becomes contaminated, carefully remove clothing and launder. Wash your hands after  handling contaminated clothing.  Place all used disposable facemasks, gloves, and other waste in a lined container before disposing them with other household waste.  Remove gloves and wash your hands immediately after handling these items.  Do not share dishes, glasses, or other household items with the patient  Avoid sharing household items. You should not share dishes, drinking glasses, cups, eating utensils, towels, bedding, or other items with a patient who is confirmed to have, or being evaluated for, COVID-19 infection.  After the person uses these items, you should wash them thoroughly with soap and water.  Wash laundry thoroughly  Immediately remove and wash clothes or bedding that have blood, body fluids, and/or secretions or excretions, such as sweat, saliva, sputum, nasal mucus, vomit, urine, or feces, on them.  Wear gloves when handling laundry from the patient.  Read and follow directions on labels of laundry or clothing items and detergent. In general, wash and dry with the warmest temperatures  recommended on the label.  Clean all areas the individual has used often  Clean all touchable surfaces, such as counters, tabletops, doorknobs, bathroom fixtures, toilets, phones, keyboards, tablets, and bedside tables, every day. Also, clean any surfaces that may have blood, body fluids, and/or secretions or excretions on them.  Wear gloves when cleaning surfaces the patient has come in contact with.  Use a diluted bleach solution (e.g., dilute bleach with 1 part bleach and 10 parts water) or a household disinfectant with a label that says EPA-registered for coronaviruses. To make a bleach solution at home, add 1 tablespoon of bleach to 1 quart (4 cups) of water. For a larger supply, add  cup of bleach to 1 gallon (16 cups) of water.  Read labels of cleaning products and follow recommendations provided on product labels. Labels contain instructions for safe and effective use of the  cleaning product including precautions you should take when applying the product, such as wearing gloves or eye protection and making sure you have good ventilation during use of the product.  Remove gloves and wash hands immediately after cleaning.  Monitor yourself for signs and symptoms of illness Caregivers and household members are considered close contacts, should monitor their health, and will be asked to limit movement outside of the home to the extent possible. Follow the monitoring steps for close contacts listed on the symptom monitoring form.   ? If you have additional questions, contact your local health department or call the epidemiologist on call at [phone] (available 24/7). ? This guidance is subject to change. For the most up-to-date guidance from CDC, please refer to their website: YouBlogs.pl    Activity:  As tolerated with Full fall precautions use walker/cane & assistance as needed   Discharge Instructions    Diet - low sodium heart healthy   Complete by: As directed    Diet Carb Modified   Complete by: As directed    Discharge instructions   Complete by: As directed    Follow with Primary MD  Gildardo Cranker, DO in 1-2 weeks  Please get a complete blood count and chemistry panel checked by your Primary MD at your next visit, and again as instructed by your Primary MD.  Get Medicines reviewed and adjusted: Please take all your medications with you for your next visit with your Primary MD  Laboratory/radiological data: Please request your Primary MD to go over all hospital tests and procedure/radiological results at the follow up, please ask your Primary MD to get all Hospital records sent to his/her office.  In some cases, they will be blood work, cultures and biopsy results pending at the time of your discharge. Please request that your primary care M.D. follows up on these results.  Also  Note the following: If you experience worsening of your admission symptoms, develop shortness of breath, life threatening emergency, suicidal or homicidal thoughts you must seek medical attention immediately by calling 911 or calling your MD immediately  if symptoms less severe.  You must read complete instructions/literature along with all the possible adverse reactions/side effects for all the Medicines you take and that have been prescribed to you. Take any new Medicines after you have completely understood and accpet all the possible adverse reactions/side effects.   Do not drive when taking Pain medications or sleeping medications (Benzodaizepines)  Do not take more than prescribed Pain, Sleep and Anxiety Medications. It is not advisable to combine anxiety,sleep and pain medications without talking with your primary care practitioner  Special Instructions: If you have smoked or chewed Tobacco  in the last 2 yrs please stop smoking, stop any regular Alcohol  and or any Recreational drug use.  Wear Seat belts while driving.  Please note: You were cared for by a hospitalist during your hospital stay. Once you are discharged, your primary care physician will handle any further medical issues. Please note that NO REFILLS for any discharge medications will be authorized once you are discharged, as it is imperative that you return to your primary care physician (or establish a relationship with a primary care physician if you do not have one) for your post hospital discharge needs so that they can reassess your need for medications and monitor your lab values.   Increase activity slowly   Complete by: As directed      Allergies as of 05/18/2019      Reactions   Sulfonamide Derivatives Hives, Shortness Of Breath   Penicillins Hives, Itching   Has patient had a PCN reaction causing immediate rash, facial/tongue/throat swelling, SOB or lightheadedness with hypotension: unknown Has patient had a  PCN reaction causing severe rash involving mucus membranes or skin necrosis: unknown Has patient had a PCN reaction that required hospitalization: unknown Has patient had a PCN reaction occurring within the last 10 years: unknown If all of the above answers are "NO", then may proceed with Cephalosporin use. Prior course of rocephin charted 05/2015      Medication List    TAKE these medications   acetaminophen 325 MG tablet Commonly known as: TYLENOL Take 650 mg by mouth 3 (three) times daily.   albuterol (2.5 MG/3ML) 0.083% nebulizer solution Commonly known as: PROVENTIL Take 3 mLs (2.5 mg total) by nebulization every 2 (two) hours as needed for wheezing.   amantadine 100 MG capsule Commonly known as: SYMMETREL Take 100 mg by mouth daily.   ascorbic acid 500 MG tablet Commonly known as: VITAMIN C Take 500 mg by mouth 2 (two) times daily.   b complex vitamins tablet Take 1 tablet by mouth daily.   Baclofen 5 MG Tabs Take 5 mg by mouth 4 (four) times daily. Hold if lethargic or confused   buPROPion 150 MG 24 hr tablet Commonly known as: WELLBUTRIN XL Take 150 mg by mouth daily after breakfast.   carvedilol 25 MG tablet Commonly known as: COREG Take 1 tablet (25 mg total) by mouth 2 (two) times daily with a meal.   CRANBERRY PO Take 100 mg by mouth at bedtime.   DULOXETINE HCL PO Take 90 mg by mouth daily.   famotidine 20 MG tablet Commonly known as: PEPCID Take 1 tablet (20 mg total) by mouth 2 (two) times daily.   ferrous sulfate 325 (65 FE) MG tablet Take 325 mg by mouth daily with breakfast.   fluticasone 50 MCG/ACT nasal spray Commonly known as: FLONASE Place 2 sprays into both nostrils every evening.   gabapentin 800 MG tablet Commonly known as: NEURONTIN Take 800 mg by mouth 3 (three) times daily.   insulin glargine 100 UNIT/ML injection Commonly known as: LANTUS Inject 0.1 mLs (10 Units total) into the skin daily. Start taking on: May 19, 2019   LACTAID PO Take 1 tablet by mouth at bedtime.   latanoprost 0.005 % ophthalmic solution Commonly known as: XALATAN Place 1 drop into both eyes at bedtime.   Linzess 290 MCG Caps capsule Generic drug: linaclotide Take 290 mcg by mouth daily before breakfast.   Melatonin 5 MG Tabs  Take 10 mg by mouth at bedtime.   MULTIVITAMIN ADULT PO Take 1 tablet by mouth daily.   NovoLOG FlexPen 100 UNIT/ML FlexPen Generic drug: insulin aspart 0-15 Units, Subcutaneous, 3 times daily with meals CBG < 70: implement hypoglycemia protocol-call MD CBG 70 - 120: 0 units CBG 121 - 150: 2 units CBG 151 - 200: 3 units CBG 201 - 250: 5 units CBG 251 - 300: 8 units CBG 301 - 350: 11 units CBG 351 - 400: 15 units CBG > 400:   omeprazole 20 MG capsule Commonly known as: PRILOSEC Take 20 mg by mouth daily.   ondansetron 4 MG tablet Commonly known as: ZOFRAN Take 4 mg by mouth every 6 (six) hours as needed for nausea or vomiting.   potassium chloride 20 MEQ packet Commonly known as: KLOR-CON Take 20 mEq by mouth daily.   predniSONE 10 MG tablet Commonly known as: DELTASONE Take 40 mg daily for 1 day, 30 mg daily for 1 day, 20 mg daily for 1 days,10 mg daily for 1 day, then stop   Probiotic Acidophilus Caps Take 1 capsule by mouth every other day.   Quercetin 250 MG Tabs Take 250 mg by mouth 2 (two) times daily.   Senokot S 8.6-50 MG tablet Generic drug: senna-docusate Take 2 tablets by mouth 2 (two) times daily.   spironolactone 25 MG tablet Commonly known as: ALDACTONE Take 25 mg by mouth daily. For CHF   tamsulosin 0.4 MG Caps capsule Commonly known as: FLOMAX Take 0.4 mg by mouth daily. For incontinence   torsemide 20 MG tablet Commonly known as: DEMADEX Take 40 mg by mouth daily.   traMADol 50 MG tablet Commonly known as: ULTRAM Take 1 tablet (50 mg total) by mouth every 8 (eight) hours as needed for moderate pain or severe pain.   VITAMIN D-3 PO Take 1,000  Units by mouth at bedtime.   zinc sulfate 220 (50 Zn) MG capsule Take 220 mg by mouth daily.      Follow-up Information    Gildardo Cranker, DO. Schedule an appointment as soon as possible for a visit in 1 week(s).   Specialty: Internal Medicine Contact information: [place] [address] [phone]          Allergies  Allergen Reactions   Sulfonamide Derivatives Hives and Shortness Of Breath   Penicillins Hives and Itching    Has patient had a PCN reaction causing immediate rash, facial/tongue/throat swelling, SOB or lightheadedness with hypotension: unknown Has patient had a PCN reaction causing severe rash involving mucus membranes or skin necrosis: unknown Has patient had a PCN reaction that required hospitalization: unknown Has patient had a PCN reaction occurring within the last 10 years: unknown If all of the above answers are "NO", then may proceed with Cephalosporin use. Prior course of rocephin charted 05/2015    Consultations:   None   Other Procedures/Studies: Dg Chest Port 1 View  Result Date: 05/10/2019 CLINICAL DATA:  Patient arrived from [place]. Patient facility called due to patient being more lethargic than normal. H/o Covid-19. Cardiomyopathy, Chronic Systolic Heart Failure, HTN, MS. Former smoker. EXAM: PORTABLE CHEST 1 VIEW COMPARISON:  Chest radiograph 11/07/2015 FINDINGS: Stable cardiomediastinal contours. There are subtle airspace opacities throughout the right lung. Small linear opacities at the left base likely reflect atelectasis. No pneumothorax or large pleural effusion. No acute finding in the visualized skeleton. IMPRESSION: Asymmetric hazy airspace opacities throughout the right lung raise concern for infection versus asymmetric edema.  Minimal linear opacities at the left base likely atelectasis. Electronically Signed   By: Audie Pinto M.D.   On: 05/10/2019 16:15   [place] Ekg Site Rite  Result Date: 05/17/2019 If  Site Rite image not attached, placement could not be confirmed due to current cardiac rhythm.    TODAY-DAY OF DISCHARGE:  Subjective:   Shannon Garrett today has no headache,no chest abdominal pain,no new weakness tingling or numbness, feels much better wants to go home today.   Objective:   Blood pressure (!) 142/77, pulse 90, temperature 98 F (36.7 C), temperature source Oral, resp. rate 19, height 5\' 5"  (1.651 m), weight 120 kg, SpO2 98 %.  Intake/Output Summary (Last 24 hours) at 05/18/2019 1232 Last data filed at 05/18/2019 0600 Gross per 24 hour  Intake 1830.75 ml  Output 2625 ml  Net -794.25 ml   Filed Weights   05/10/19 1405  Weight: 120 kg    Exam: Awake Alert, Oriented *3, No new F.N deficits, Normal affect [place].AT,PERRAL Supple Neck,No JVD, No cervical lymphadenopathy appriciated.  Symmetrical Chest wall movement, Good air movement bilaterally, CTAB RRR,No Gallops,Rubs or new Murmurs, No Parasternal Heave +ve B.Sounds, Abd Soft, Non tender, No organomegaly appriciated, No rebound -guarding or rigidity. No Cyanosis, Clubbing or edema, No new Rash or bruise   PERTINENT RADIOLOGIC STUDIES: Dg Chest Port 1 View  Result Date: 05/10/2019 CLINICAL DATA:  Patient arrived from [place]. Patient facility called due to patient being more lethargic than normal. H/o Covid-19. Cardiomyopathy, Chronic Systolic Heart Failure, HTN, MS. Former smoker. EXAM: PORTABLE CHEST 1 VIEW COMPARISON:  Chest radiograph 11/07/2015 FINDINGS: Stable cardiomediastinal contours. There are subtle airspace opacities throughout the right lung. Small linear opacities at the left base likely reflect atelectasis. No pneumothorax or large pleural effusion. No acute finding in the visualized skeleton. IMPRESSION: Asymmetric hazy airspace opacities throughout the right lung raise concern for infection versus asymmetric edema. Minimal linear opacities at the left base likely atelectasis. Electronically  Signed   By: Audie Pinto M.D.   On: 05/10/2019 16:15   [place] Ekg Site Rite  Result Date: 05/17/2019 If Site Rite image not attached, placement could not be confirmed due to current cardiac rhythm.    PERTINENT LAB RESULTS: CBC: Recent Labs    05/17/19 0411 05/18/19 0451  WBC 9.6 10.3  HGB 10.3* 11.0*  HCT 31.2* 33.4*  PLT 290 358   CMET CMP     Component Value Date/Time   NA 143 05/18/2019 0451   NA 140 08/26/2018 0959   K 4.1 05/18/2019 0451   CL 103 05/18/2019 0451   CO2 29 05/18/2019 0451   GLUCOSE 249 (H) 05/18/2019 0451   BUN 36 (H) 05/18/2019 0451   BUN 10 08/26/2018 0959   CREATININE 0.92 05/18/2019 0451   CALCIUM 9.3 05/18/2019 0451   PROT 6.1 (L) 05/18/2019 0451   PROT 6.4 08/26/2018 0959   ALBUMIN 2.7 (L) 05/18/2019 0451   ALBUMIN 3.9 08/26/2018 0959   AST 51 (H) 05/18/2019 0451   ALT 80 (H) 05/18/2019 0451   ALKPHOS 255 (H) 05/18/2019 0451   BILITOT 0.6 05/18/2019 0451   BILITOT 0.2 08/26/2018 0959   GFRNONAA >60 05/18/2019 0451   GFRAA >60 05/18/2019 0451    GFR Estimated Creatinine Clearance: 88.6 mL/min (by C-G formula based on SCr of 0.92 mg/dL). No results for input(s): LIPASE, AMYLASE in the last 72 hours. No results for input(s): CKTOTAL, CKMB, CKMBINDEX, TROPONINI in the last 72 hours. Invalid input(s): POCBNP Recent  Labs    05/17/19 0411 05/18/19 0451  DDIMER 1.00* 0.92*   No results for input(s): HGBA1C in the last 72 hours. No results for input(s): CHOL, HDL, LDLCALC, TRIG, CHOLHDL, LDLDIRECT in the last 72 hours. No results for input(s): TSH, T4TOTAL, T3FREE, THYROIDAB in the last 72 hours.  Invalid input(s): FREET3 Recent Labs    05/17/19 0411 05/18/19 0451  FERRITIN 577* 650*   Coags: No results for input(s): INR in the last 72 hours.  Invalid input(s): PT Microbiology: Recent Results (from the past 240 hour(s))  Blood Culture (routine x 2)     Status: None   Collection Time: 05/10/19  2:52 PM   Specimen: BLOOD   Result Value Ref Range Status   Specimen Description   Final    BLOOD RIGHT FOREARM Performed at [facility] [address], [place], [place] [address]    Special Requests   Final    BOTTLES DRAWN AEROBIC AND ANAEROBIC Blood Culture results may not be optimal due to an excessive volume of blood received in culture bottles Performed at [facility] [address] [place], [place] [address]    Culture   Final    NO GROWTH 5 DAYS Performed at [facility], [place] [address] [place], [place] [address]    Report Status 05/15/2019 FINAL  Final  Blood Culture (routine x 2)     Status: None   Collection Time: 05/10/19  2:52 PM   Specimen: BLOOD  Result Value Ref Range Status   Specimen Description   Final    BLOOD RIGHT FOREARM Performed at [facility] [address] [place], [place] [address]    Special Requests   Final    BOTTLES DRAWN AEROBIC AND ANAEROBIC Blood Culture results may not be optimal due to an excessive volume of blood received in culture bottles Performed at [facility] [address] [place], [place] [address]    Culture   Final    NO GROWTH 5 DAYS Performed at [facility], [place] [address], [place], [place] [address]    Report Status 05/15/2019 FINAL  Final  SARS CORONAVIRUS 2 (TAT 6-24 HRS) Nasopharyngeal Nasopharyngeal Swab     Status: Abnormal   Collection Time: 05/10/19  5:40 PM   Specimen: Nasopharyngeal Swab  Result Value Ref Range Status   SARS Coronavirus 2 POSITIVE (A) NEGATIVE Final    Comment: RESULT CALLED TO, READ BACK BY AND VERIFIED WITH: Helane Rima, RN AT [phone] ON 05/11/2019 BY SAINVILUS S (NOTE) SARS-CoV-2 target nucleic acids are DETECTED. The SARS-CoV-2 RNA is generally detectable in upper and lower respiratory specimens during the acute phase of infection. Positive results are indicative of active infection with SARS-CoV-2. Clinical  correlation  with patient history and other diagnostic information is necessary to determine patient infection status. Positive results do  not rule out bacterial infection or co-infection with other viruses. The expected result is Negative. Fact Sheet for Patients: SugarRoll.be Fact Sheet for Healthcare Providers: https://www.woods-mathews.com/ This test is not yet approved or cleared by the [place] FDA and  has been authorized for detection and/or diagnosis of SARS-CoV-2 by FDA under an Emergency Use Authorization (EUA). This EUA will remain  in effect (meaning this test  can be used) for the duration of the COVID-19 declaration under Section 564(b)(1) of the Act, 21 U.S.C. section 360bbb-3(b)(1), unless the authorization is terminated or revoked sooner. Performed at [facility], [address] [place], [place] [address]   Urine culture     Status: Abnormal   Collection Time: 05/10/19 11:50 PM   Specimen: In/Out Cath Urine  Result Value Ref Range Status   Specimen Description   Final    IN/OUT CATH URINE Performed at [facility] [address], [place], [place] [address]    Special Requests   Final    NONE Performed at [facility], [place] [address] [place], [place] [address]    Culture (A)  Final    40,000 COLONIES/mL KLEBSIELLA PNEUMONIAE Confirmed Extended Spectrum Beta-Lactamase Producer (ESBL).  In bloodstream infections from ESBL organisms, carbapenems are preferred over piperacillin/tazobactam. They are shown to have a lower risk of mortality.    Report Status 05/12/2019 FINAL  Final   Organism ID, Bacteria KLEBSIELLA PNEUMONIAE (A)  Final      Susceptibility   Klebsiella pneumoniae - MIC*    AMPICILLIN >=32 RESISTANT Resistant     CEFAZOLIN >=64 RESISTANT Resistant     CEFTRIAXONE >=64 RESISTANT Resistant     CIPROFLOXACIN >=4 RESISTANT Resistant     GENTAMICIN >=16 RESISTANT Resistant      IMIPENEM <=0.25 SENSITIVE Sensitive     NITROFURANTOIN 64 INTERMEDIATE Intermediate     TRIMETH/SULFA >=320 RESISTANT Resistant     AMPICILLIN/SULBACTAM >=32 RESISTANT Resistant     PIP/TAZO 16 SENSITIVE Sensitive     Extended ESBL POSITIVE Resistant     * 40,000 COLONIES/mL KLEBSIELLA PNEUMONIAE    FURTHER DISCHARGE INSTRUCTIONS:  Get Medicines reviewed and adjusted: Please take all your medications with you for your next visit with your Primary MD  Laboratory/radiological data: Please request your Primary MD to go over all hospital tests and procedure/radiological results at the follow up, please ask your Primary MD to get all Hospital records sent to his/her office.  In some cases, they will be blood work, cultures and biopsy results pending at the time of your discharge. Please request that your primary care M.D. goes through all the records of your hospital data and follows up on these results.  Also Note the following: If you experience worsening of your admission symptoms, develop shortness of breath, life threatening emergency, suicidal or homicidal thoughts you must seek medical attention immediately by calling 911 or calling your MD immediately  if symptoms less severe.  You must read complete instructions/literature along with all the possible adverse reactions/side effects for all the Medicines you take and that have been prescribed to you. Take any new Medicines after you have completely understood and accpet all the possible adverse reactions/side effects.   Do not drive when taking Pain medications or sleeping medications (Benzodaizepines)  Do not take more than prescribed Pain, Sleep and Anxiety Medications. It is not advisable to combine anxiety,sleep and pain medications without talking with your primary care practitioner  Special Instructions: If you have smoked or chewed Tobacco  in the last 2 yrs please stop smoking, stop any regular Alcohol  and or any  Recreational drug use.  Wear Seat belts while driving.  Please note: You were cared for by a hospitalist during your hospital stay. Once you are discharged, your primary care physician will handle any further medical issues. Please note that NO REFILLS for any discharge medications will be authorized once you are discharged, as it is imperative that you return to your primary care physician (or establish a relationship with a primary care physician if you do not have one) for your post  hospital discharge needs so that they can reassess your need for medications and monitor your lab values.  Total Time spent coordinating discharge including counseling, education and face to face time equals 35 minutes.  SignedOren Binet 05/18/2019 12:32 PM

## 2019-05-18 NOTE — Progress Notes (Signed)
Pharmacy Antibiotic Note  Shannon Garrett is a 58 y.o. female admitted on 05/10/2019 with UTI.  Pharmacy has been consulted for meropenem dosing given urinary symptoms coupled with urine culture growing ESBL Klebsiella.  The patient continues on Meropenem with a stop planned for 11/17.   Plan: -Continue meropenem 1 gm IV q 8 hours -LOT is 7 days per MD note.  -Monitor CBC, renal fx and clinical progress   Height: 5\' 5"  (165.1 cm) Weight: 264 lb 8.8 oz (120 kg) IBW/kg (Calculated) : 57  Temp (24hrs), Avg:97.9 F (36.6 C), Min:97.6 F (36.4 C), Max:98.1 F (36.7 C)  Recent Labs  Lab 05/14/19 0210 05/14/19 0512 05/15/19 0407 05/16/19 0140 05/17/19 0411 05/18/19 0451  WBC  --  10.7* 9.9 9.6 9.6 10.3  CREATININE 1.23*  --  1.07* 1.04* 0.86 0.92    Estimated Creatinine Clearance: 88.6 mL/min (by C-G formula based on SCr of 0.92 mg/dL).    Allergies  Allergen Reactions  . Sulfonamide Derivatives Hives and Shortness Of Breath  . Penicillins Hives and Itching    Has patient had a PCN reaction causing immediate rash, facial/tongue/throat swelling, SOB or lightheadedness with hypotension: unknown Has patient had a PCN reaction causing severe rash involving mucus membranes or skin necrosis: unknown Has patient had a PCN reaction that required hospitalization: unknown Has patient had a PCN reaction occurring within the last 10 years: unknown If all of the above answers are "NO", then may proceed with Cephalosporin use. Prior course of rocephin charted 05/2015     Thank you for allowing pharmacy to be a part of this patient's care.  Alycia Rossetti, PharmD, BCPS Clinical Pharmacist Clinical phone for 05/18/2019: [phone] 05/18/2019 11:22 AM   **Pharmacist phone directory can now be found on [email or web address] (PW [id number]).  Listed under [place].

## 2019-05-18 NOTE — TOC Progression Note (Addendum)
Transition of Care [facility]) - Progression Note    Patient Details  Name: Shannon Garrett MRN: [id number] Date of Birth: December 12, 1960  Transition of [facility]) CM/SW Contact  Joaquin Courts, RN Phone Number: 05/18/2019, 12:47 PM  Clinical Narrative:    Patient is a long term resident at [facility], bed availability confirmed for dc today. Patient will go to Room 220, bedside RN please call report to [phone]. CM spoke with patient's sister and notified her of dc plan for today, sister is in agreement. Patient to be transported by [facility].    Expected Discharge Plan: [place] Barriers to Discharge: No Barriers Identified  Expected Discharge Plan and Services Expected Discharge Plan: [place]   Discharge Planning Services: CM Consult Post Acute Care Choice: Resumption of Svcs/PTA Provider Living arrangements for the past 2 months: [place] Expected Discharge Date: 05/18/19               DME Arranged: N/A DME Agency: NA       HH Arranged: NA HH Agency: NA         Social Determinants of Health (SDOH) Interventions    Readmission Risk Interventions No flowsheet data found.

## 2019-05-18 NOTE — Progress Notes (Addendum)
Attempted to call report, no answer, will try again later  Report called to Nurse Deandrea at facility.

## 2019-05-18 NOTE — Progress Notes (Signed)
Pt left with PTAR, all paperwork given, tramadol script sent with pt along with DNR and MOST form

## 2019-05-19 LAB — GLUCOSE, CAPILLARY: Glucose-Capillary: 340 mg/dL — ABNORMAL HIGH (ref 70–99)

## 2019-06-24 ENCOUNTER — Ambulatory Visit (INDEPENDENT_AMBULATORY_CARE_PROVIDER_SITE_OTHER): Payer: Medicare Other | Admitting: Physician Assistant

## 2019-06-24 ENCOUNTER — Other Ambulatory Visit (INDEPENDENT_AMBULATORY_CARE_PROVIDER_SITE_OTHER): Payer: Medicare Other

## 2019-06-24 ENCOUNTER — Encounter: Payer: Self-pay | Admitting: Physician Assistant

## 2019-06-24 VITALS — BP 96/60 | HR 91 | Temp 97.4°F

## 2019-06-24 DIAGNOSIS — A0472 Enterocolitis due to Clostridium difficile, not specified as recurrent: Secondary | ICD-10-CM | POA: Insufficient documentation

## 2019-06-24 DIAGNOSIS — R109 Unspecified abdominal pain: Secondary | ICD-10-CM

## 2019-06-24 DIAGNOSIS — Z8619 Personal history of other infectious and parasitic diseases: Secondary | ICD-10-CM

## 2019-06-24 DIAGNOSIS — U071 COVID-19: Secondary | ICD-10-CM

## 2019-06-24 LAB — COMPREHENSIVE METABOLIC PANEL
ALT: 521 U/L — ABNORMAL HIGH (ref 0–35)
AST: 339 U/L — ABNORMAL HIGH (ref 0–37)
Albumin: 3.2 g/dL — ABNORMAL LOW (ref 3.5–5.2)
Alkaline Phosphatase: 1018 U/L — ABNORMAL HIGH (ref 39–117)
BUN: 16 mg/dL (ref 6–23)
CO2: 23 mEq/L (ref 19–32)
Calcium: 9 mg/dL (ref 8.4–10.5)
Chloride: 103 mEq/L (ref 96–112)
Creatinine, Ser: 0.97 mg/dL (ref 0.40–1.20)
GFR: 71.72 mL/min (ref >60.00)
Glucose, Bld: 171 mg/dL — ABNORMAL HIGH (ref 70–99)
Potassium: 3.5 mEq/L (ref 3.5–5.1)
Sodium: 139 mEq/L (ref 135–145)
Total Bilirubin: 1.4 mg/dL — ABNORMAL HIGH (ref 0.2–1.2)
Total Protein: 6.9 g/dL (ref 6.0–8.3)

## 2019-06-24 LAB — CBC WITH DIFFERENTIAL/PLATELET
Basophils Absolute: 0.3 10*3/uL — ABNORMAL HIGH (ref 0.0–0.1)
Basophils Relative: 2.9 % (ref 0.0–3.0)
Eosinophils Absolute: 0.9 10*3/uL — ABNORMAL HIGH (ref 0.0–0.7)
Eosinophils Relative: 7.5 % — ABNORMAL HIGH (ref 0.0–5.0)
HCT: 27.2 % — ABNORMAL LOW (ref 36.0–46.0)
Hemoglobin: 8.9 g/dL — ABNORMAL LOW (ref 12.0–15.0)
Lymphocytes Relative: 13.7 % (ref 12.0–46.0)
Lymphs Abs: 1.6 10*3/uL (ref 0.7–4.0)
MCHC: 32.6 g/dL (ref 30.0–36.0)
MCV: 83.8 fl (ref 78.0–100.0)
Monocytes Absolute: 0.7 10*3/uL (ref 0.1–1.0)
Monocytes Relative: 6.1 % (ref 3.0–12.0)
Neutro Abs: 8.3 10*3/uL — ABNORMAL HIGH (ref 1.4–7.7)
Neutrophils Relative %: 69.8 % (ref 43.0–77.0)
Platelets: 599 10*3/uL — ABNORMAL HIGH (ref 150.0–400.0)
RBC: 3.25 Mil/uL — ABNORMAL LOW (ref 3.87–5.11)
RDW: 17.1 % — ABNORMAL HIGH (ref 11.5–15.5)
WBC: 11.8 10*3/uL — ABNORMAL HIGH (ref 4.0–10.5)

## 2019-06-24 NOTE — Progress Notes (Signed)
Subjective:    Patient ID: Shannon Garrett, female    DOB: 1960/10/18, 58 y.o.   MRN: [id number]  HPI Shannon Garrett is a pleasant 58 year old African-American female, established with Dr. Fuller Plan, who was seen here in 2018 with chronic constipation.  Patient has multiple comorbidities and is currently a resident at [facility] facility.  She has history of congestive heart failure, adult onset diabetes mellitus, Addison's disease, history of prior CVA with paraplegia, MS, chronic anemia. She was hospitalized 11/8 through 05/18/2019 with COVID-19 infection with pneumonia and also had a Klebsiella UTI. She was referred here today with complaints of abdominal pain.  Patient is not a great historian, says she has pain all the time but has been having different and worse abdominal pain over the past 2 to 3 weeks.  This is been intermittent, described as sharp and located in the lower abdomen more to the right.  She is currently being treated for C. difficile with vancomycin 125 mg p.o. 4 times daily.  I do not have any notes or labs from the nursing home available for review today, I do have a med list. Patient says she has been able to eat without difficulty, no nausea or vomiting and has not noticed any change in her abdominal pain with p.o. intake.  She has been having about 2 bowel movements per day which have been loose..  She has been unaware of fever or chills.  She is on tramadol chronically for pain, and says she is also been receiving some other pain medication through the nursing facility. She has not had any recent abdominal imaging. No labs available for review-most recent labs 05/18/2019-WBC 10.3, hemoglobin 11 hematocrit of 33.4. T bili 0.6/alk phos 255/ALT 80/AST 51, ferritin 650 and D-dimer 0.92.  This was in the setting of Covid infection.  Review of Systems Pertinent positive and negative review of systems were noted in the above HPI section.  All other review of systems was otherwise  negative.  Outpatient Encounter Medications as of 06/24/2019  Medication Sig   acetaminophen (TYLENOL) 325 MG tablet Take 650 mg by mouth 3 (three) times daily.   albuterol (PROVENTIL) (2.5 MG/3ML) 0.083% nebulizer solution Take 3 mLs (2.5 mg total) by nebulization every 2 (two) hours as needed for wheezing.   amantadine (SYMMETREL) 100 MG capsule Take 100 mg by mouth daily.    ascorbic acid (VITAMIN C) 500 MG tablet Take 500 mg by mouth 2 (two) times daily.   b complex vitamins tablet Take 1 tablet by mouth daily.   Baclofen 5 MG TABS Take 5 mg by mouth 4 (four) times daily. Hold if lethargic or confused    buPROPion (WELLBUTRIN XL) 150 MG 24 hr tablet Take 150 mg by mouth daily after breakfast.   carvedilol (COREG) 25 MG tablet Take 1 tablet (25 mg total) by mouth 2 (two) times daily with a meal.   Cholecalciferol (VITAMIN D-3 PO) Take 1,000 Units by mouth at bedtime.    CRANBERRY PO Take 100 mg by mouth at bedtime.    DULOXETINE HCL PO Take 90 mg by mouth daily.    famotidine (PEPCID) 20 MG tablet Take 1 tablet (20 mg total) by mouth 2 (two) times daily.   ferrous sulfate 325 (65 FE) MG tablet Take 325 mg by mouth daily with breakfast.   fluticasone (FLONASE) 50 MCG/ACT nasal spray Place 2 sprays into both nostrils every evening.   gabapentin (NEURONTIN) 800 MG tablet Take 800 mg by mouth 3 (  three) times daily.   insulin aspart (NOVOLOG FLEXPEN) 100 UNIT/ML FlexPen 0-15 Units, Subcutaneous, 3 times daily with meals CBG < 70: implement hypoglycemia protocol-call MD CBG 70 - 120: 0 units CBG 121 - 150: 2 units CBG 151 - 200: 3 units CBG 201 - 250: 5 units CBG 251 - 300: 8 units CBG 301 - 350: 11 units CBG 351 - 400: 15 units CBG > 400:   insulin glargine (LANTUS) 100 UNIT/ML injection Inject 0.1 mLs (10 Units total) into the skin daily.   Lactase (LACTAID PO) Take 1 tablet by mouth at bedtime.    Lactobacillus (PROBIOTIC ACIDOPHILUS) CAPS Take 1 capsule by mouth  every other day.   latanoprost (XALATAN) 0.005 % ophthalmic solution Place 1 drop into both eyes at bedtime.   linaclotide (LINZESS) 290 MCG CAPS capsule Take 290 mcg by mouth daily before breakfast.    Melatonin 5 MG TABS Take 10 mg by mouth at bedtime.    Multiple Vitamins-Minerals (MULTIVITAMIN ADULT PO) Take 1 tablet by mouth daily.   omeprazole (PRILOSEC) 20 MG capsule Take 20 mg by mouth daily.   ondansetron (ZOFRAN) 4 MG tablet Take 4 mg by mouth every 6 (six) hours as needed for nausea or vomiting.   potassium chloride (KLOR-CON) 20 MEQ packet Take 20 mEq by mouth daily.    predniSONE (DELTASONE) 10 MG tablet Take 40 mg daily for 1 day, 30 mg daily for 1 day, 20 mg daily for 1 days,10 mg daily for 1 day, then stop   Quercetin 250 MG TABS Take 250 mg by mouth 2 (two) times daily.   senna-docusate (SENOKOT S) 8.6-50 MG tablet Take 2 tablets by mouth 2 (two) times daily.    spironolactone (ALDACTONE) 25 MG tablet Take 25 mg by mouth daily. For CHF   tamsulosin (FLOMAX) 0.4 MG CAPS capsule Take 0.4 mg by mouth daily. For incontinence   torsemide (DEMADEX) 20 MG tablet Take 40 mg by mouth daily.    traMADol (ULTRAM) 50 MG tablet Take 1 tablet (50 mg total) by mouth every 8 (eight) hours as needed for moderate pain or severe pain.   vancomycin (VANCOCIN) 125 MG capsule Take 125 mg by mouth 4 (four) times daily.   zinc sulfate 220 (50 Zn) MG capsule Take 220 mg by mouth daily.   No facility-administered encounter medications on file as of 06/24/2019.   Allergies  Allergen Reactions   Sulfonamide Derivatives Hives and Shortness Of Breath   Penicillins Hives and Itching    Has patient had a PCN reaction causing immediate rash, facial/tongue/throat swelling, SOB or lightheadedness with hypotension: unknown Has patient had a PCN reaction causing severe rash involving mucus membranes or skin necrosis: unknown Has patient had a PCN reaction that required hospitalization:  unknown Has patient had a PCN reaction occurring within the last 10 years: unknown If all of the above answers are "NO", then may proceed with Cephalosporin use. Prior course of rocephin charted 05/2015   Patient Active Problem List   Diagnosis Date Noted   Enteritis due to Clostridium difficile 06/24/2019   COVID-19 virus infection 06/24/2019   Palliative care by specialist    DNR (do not resuscitate)    Sepsis ([place]) 05/10/2019   Dysesthesia 08/26/2018   Dyslipidemia 06/13/2017   Elevated alkaline phosphatase level 06/13/2017   Acute frontal sinusitis 05/24/2017   Influenza 04/19/2017   Glaucoma 04/06/2017   Allergic rhinitis due to allergen 03/07/2017   Vaginal candidiasis 03/01/2017   UTI (urinary tract  infection) 01/02/2017   High risk medication use 01/25/2016   Neuropathy 11/13/2015   GERD (gastroesophageal reflux disease) 11/13/2015   Dysphagia as late effect of stroke 11/08/2015   Adrenal insufficiency (Addison's disease) ([place]) 08/12/2015   Vitamin D deficiency 06/18/2015   Hypokalemia 06/03/2015   Chronic anemia 05/30/2015   Chronic pain syndrome 03/03/2015   Essential hypertension    Chronic constipation 11/26/2014   Depression 11/26/2014   Adult failure to thrive 07/31/2013   Insomnia 06/03/2013   Neurogenic bladder 06/03/2013   Paraplegia ([place]) 09/05/2012   Chronic combined systolic and diastolic congestive heart failure ([place]) 05/22/2012   Obesity 11/30/2008   MS (multiple sclerosis) ([place]) 11/30/2008   Hypertensive heart disease with CHF (congestive heart failure) ([place]) 11/30/2008   Social History   Socioeconomic History   Marital status: Single    Spouse name: Not on file   Number of children: Not on file   Years of education: Not on file   Highest education level: Not on file  Occupational History   Not on file  Tobacco Use   Smoking status: Former Smoker    Packs/day: 0.50    Years: 5.00    Pack years: 2.50    Smokeless tobacco: Never Used  Substance and Sexual Activity   Alcohol use: No   Drug use: No   Sexual activity: Not Currently  Other Topics Concern   Not on file  Social History Narrative   Not on file   Social Determinants of Health   Financial Resource Strain:    Difficulty of Paying Living Expenses: Not on file  Food Insecurity:    Worried About Estate manager/land agent of Food in the Last Year: Not on file   [employer] of Food in the Last Year: Not on file  Transportation Needs:    Lack of Transportation (Medical): Not on file   Lack of Transportation (Non-Medical): Not on file  Physical Activity:    Days of Exercise per Week: Not on file   Minutes of Exercise per Session: Not on file  Stress:    Feeling of Stress : Not on file  Social Connections:    Frequency of Communication with Friends and Family: Not on file   Frequency of Social Gatherings with Friends and Family: Not on file   Attends Religious Services: Not on file   Active Member of Clubs or Organizations: Not on file   Attends Archivist Meetings: Not on file   Marital Status: Not on file  Intimate Partner Violence:    Fear of Current or Ex-Partner: Not on file   Emotionally Abused: Not on file   Physically Abused: Not on file   Sexually Abused: Not on file    Ms. Groll's family history includes Cancer in her father and mother; Hypertension in her mother; Multiple sclerosis in her sister.      Objective:    Vitals:   06/24/19 1531  BP: 96/60  Pulse: 91  Temp: (!) 97.4 F (36.3 C)    Physical Exam Well-developed obese African-American female, nonambulatory, brought into office on stretcher by EMS transport- in no acute distress.   Weight 264,   HEENT; nontraumatic normocephalic, EOMI, PER R LA, sclera anicteric. Oropharynx; not examined/mask/Covid Neck; supple, no JVD Cardiovascular; regular rate and rhythm with S1-S2, no murmur rub or gallop Pulmonary; Clear  bilaterally Abdomen; soft, morbidly obese, bowel sounds hyperactive, mildly distended, she is tender in the right mid and right lower quadrant no guarding or rebound, no  palpable mass or hepatosplenomegaly,    Rectal; not done today Skin; benign exam, no jaundice rash or appreciable lesions Extremities; no clubbing cyanosis or edema skin warm and dry Neuro/Psych; alert and oriented x4,  mood and affect appropriate.  Patient is paraplegic       Assessment & Plan:   #40 58 year old African-American female with multiple serious comorbidities referred with complaints of ongoing abdominal pain over the past several weeks. Patient is currently on vancomycin for C. difficile.  She is primarily tender in the right abdomen on exam. Most patients with C. difficile will have associated abdominal pain and suspect she has an active colitis. Cannot rule out other intra-abdominal inflammatory process.  #2 recently elevated LFTs-however in setting of active COVID-19 infection November 2020 #3 morbid obesity 4.  Paraplegia 5.  History of CVA 6.  Multiple sclerosis 7.  Adult onset diabetes mellitus-insulin-dependent 8.  Addison's disease 9.  Chronic GERD 10.  History of chronic constipation 11.  Congestive heart failure most recent EF 55% 2019  Plan; Patient needs to complete 14-day course of vancomycin 125 mg p.o. 4 times daily. Have added Florastor 1 p.o. twice daily x6-week. CBC with differential, c-Met, Will schedule for CT scan of the abdomen and pelvis early next week. Further recommendations pending findings.  Brieonna Crutcher S Zuleima Haser PA-C 06/24/2019   Cc: Gildardo Cranker, DO

## 2019-06-24 NOTE — Patient Instructions (Signed)
If you are age 55 or older, your body mass index should be between 23-30. Your There is no height or weight on file to calculate BMI. If this is out of the aforementioned range listed, please consider follow up with your Primary Care Provider.  If you are age 65 or younger, your body mass index should be between 19-25. Your There is no height or weight on file to calculate BMI. If this is out of the aformentioned range listed, please consider follow up with your Primary Care Provider.   Your provider has requested that you go to the basement level for lab work before leaving today. Press "B" on the elevator. The lab is located at the first door on the left as you exit the elevator.  1. Finish Vancomycin.  2. Start Florastor probiotic twice daily for 6 weeks.  You have been scheduled for a CT scan of the abdomen and pelvis at [facility] are scheduled on Monday 06/29/19 at 9:30 am. You should arrive 15 minutes prior to your appointment time for registration. Please follow the written instructions below on the day of your exam:  WARNING: IF YOU ARE ALLERGIC TO IODINE/X-RAY DYE, PLEASE NOTIFY RADIOLOGY IMMEDIATELY AT [phone]! YOU WILL BE GIVEN A 13 HOUR PREMEDICATION PREP.  1) Do not eat or drink anything after 5:30 am (4 hours prior to your test) 2) You have been given 2 bottles of oral contrast to drink. The solution may taste better if refrigerated, but do NOT add ice or any other liquid to this solution. Shake well before drinking.    Drink 1 bottle of contrast @ 7:30 am (2 hours prior to your exam)  Drink 1 bottle of contrast @ 8:30 am (1 hour prior to your exam)  You may take any medications as prescribed with a small amount of water, if necessary. If you take any of the following medications: METFORMIN, GLUCOPHAGE, GLUCOVANCE, AVANDAMET, RIOMET, FORTAMET, [place] MET, JANUMET, GLUMETZA or METAGLIP, you MAY be asked to HOLD this medication 48 hours AFTER the exam.  The  purpose of you drinking the oral contrast is to aid in the visualization of your intestinal tract. The contrast solution may cause some diarrhea. Depending on your individual set of symptoms, you may also receive an intravenous injection of x-ray contrast/dye. Plan on being at [facility] for 30 minutes or longer, depending on the type of exam you are having performed.  This test typically takes 30-45 minutes to complete.  _________________________________________________________________

## 2019-06-25 NOTE — Progress Notes (Signed)
Reviewed and agree with management plan.  Kainoah Bartosiewicz T. Phenix Vandermeulen, MD FACG [facility]  

## 2019-06-28 NOTE — Progress Notes (Signed)
This pt is a NH pt at [facility] -please call facility ans see how pt is feeling - LFT's significantly elevated and WBC up as well, also anemia.. she is scheduled for CT on 12/28- very important she has this done - we will need to look for report ASAP

## 2019-06-29 ENCOUNTER — Ambulatory Visit (HOSPITAL_COMMUNITY)
Admission: RE | Admit: 2019-06-29 | Discharge: 2019-06-29 | Disposition: A | Discharge status: Home / Self Care | Payer: Medicare Other | Source: Ambulatory Visit | Attending: Physician Assistant | Admitting: Physician Assistant

## 2019-06-29 ENCOUNTER — Other Ambulatory Visit: Payer: Self-pay

## 2019-06-29 DIAGNOSIS — R109 Unspecified abdominal pain: Secondary | ICD-10-CM | POA: Diagnosis not present

## 2019-06-29 DIAGNOSIS — Z8619 Personal history of other infectious and parasitic diseases: Secondary | ICD-10-CM | POA: Insufficient documentation

## 2019-06-29 MED ORDER — IOHEXOL 300 MG/ML  SOLN
100.0000 mL | Freq: Once | INTRAMUSCULAR | Status: AC | PRN
  Administered 2019-06-29: 100 mL via INTRAVENOUS

## 2019-06-29 NOTE — Progress Notes (Signed)
Please call nursing home and pt and let them know the Ct scan looks good - does have some gallstones but no ductal dilation or sign of cholecystitis.  Find out how pt is feeling  Need to do labs this week - by tomorrow - CMET ,CBC,INR, acute hepatitis panel .

## 2019-07-07 NOTE — Progress Notes (Signed)
Ok, thank you

## 2019-07-18 ENCOUNTER — Inpatient Hospital Stay (HOSPITAL_COMMUNITY)
Admission: EM | Admit: 2019-07-18 | Discharge: 2019-07-23 | DRG: 854 | Disposition: A | Discharge status: Skilled Nursing Facility | Payer: Medicare (Managed Care) | Attending: Internal Medicine | Admitting: Internal Medicine

## 2019-07-18 ENCOUNTER — Encounter (HOSPITAL_COMMUNITY): Payer: Self-pay

## 2019-07-18 DIAGNOSIS — G8220 Paraplegia, unspecified: Secondary | ICD-10-CM | POA: Diagnosis present

## 2019-07-18 DIAGNOSIS — I10 Essential (primary) hypertension: Secondary | ICD-10-CM | POA: Diagnosis present

## 2019-07-18 DIAGNOSIS — E11649 Type 2 diabetes mellitus with hypoglycemia without coma: Secondary | ICD-10-CM | POA: Diagnosis not present

## 2019-07-18 DIAGNOSIS — N179 Acute kidney failure, unspecified: Secondary | ICD-10-CM | POA: Diagnosis present

## 2019-07-18 DIAGNOSIS — Z882 Allergy status to sulfonamides status: Secondary | ICD-10-CM

## 2019-07-18 DIAGNOSIS — L89152 Pressure ulcer of sacral region, stage 2: Secondary | ICD-10-CM | POA: Diagnosis present

## 2019-07-18 DIAGNOSIS — B961 Klebsiella pneumoniae [K. pneumoniae] as the cause of diseases classified elsewhere: Secondary | ICD-10-CM | POA: Diagnosis present

## 2019-07-18 DIAGNOSIS — Z7401 Bed confinement status: Secondary | ICD-10-CM

## 2019-07-18 DIAGNOSIS — Z87891 Personal history of nicotine dependence: Secondary | ICD-10-CM

## 2019-07-18 DIAGNOSIS — A419 Sepsis, unspecified organism: Secondary | ICD-10-CM | POA: Diagnosis not present

## 2019-07-18 DIAGNOSIS — I428 Other cardiomyopathies: Secondary | ICD-10-CM | POA: Diagnosis present

## 2019-07-18 DIAGNOSIS — G35 Multiple sclerosis: Secondary | ICD-10-CM | POA: Diagnosis present

## 2019-07-18 DIAGNOSIS — K819 Cholecystitis, unspecified: Secondary | ICD-10-CM | POA: Diagnosis not present

## 2019-07-18 DIAGNOSIS — K219 Gastro-esophageal reflux disease without esophagitis: Secondary | ICD-10-CM | POA: Diagnosis present

## 2019-07-18 DIAGNOSIS — Z79890 Hormone replacement therapy (postmenopausal): Secondary | ICD-10-CM

## 2019-07-18 DIAGNOSIS — Z880 Allergy status to penicillin: Secondary | ICD-10-CM

## 2019-07-18 DIAGNOSIS — Z8249 Family history of ischemic heart disease and other diseases of the circulatory system: Secondary | ICD-10-CM

## 2019-07-18 DIAGNOSIS — K567 Ileus, unspecified: Secondary | ICD-10-CM

## 2019-07-18 DIAGNOSIS — I5032 Chronic diastolic (congestive) heart failure: Secondary | ICD-10-CM | POA: Diagnosis present

## 2019-07-18 DIAGNOSIS — Z794 Long term (current) use of insulin: Secondary | ICD-10-CM

## 2019-07-18 DIAGNOSIS — N390 Urinary tract infection, site not specified: Secondary | ICD-10-CM | POA: Diagnosis present

## 2019-07-18 DIAGNOSIS — Z87440 Personal history of urinary (tract) infections: Secondary | ICD-10-CM

## 2019-07-18 DIAGNOSIS — Z820 Family history of epilepsy and other diseases of the nervous system: Secondary | ICD-10-CM

## 2019-07-18 DIAGNOSIS — I110 Hypertensive heart disease with heart failure: Secondary | ICD-10-CM | POA: Diagnosis present

## 2019-07-18 DIAGNOSIS — Z79899 Other long term (current) drug therapy: Secondary | ICD-10-CM

## 2019-07-18 DIAGNOSIS — G894 Chronic pain syndrome: Secondary | ICD-10-CM | POA: Diagnosis present

## 2019-07-18 DIAGNOSIS — Z66 Do not resuscitate: Secondary | ICD-10-CM | POA: Diagnosis present

## 2019-07-18 DIAGNOSIS — Z419 Encounter for procedure for purposes other than remedying health state, unspecified: Secondary | ICD-10-CM

## 2019-07-18 DIAGNOSIS — Z8616 Personal history of COVID-19: Secondary | ICD-10-CM | POA: Diagnosis present

## 2019-07-18 DIAGNOSIS — Z8619 Personal history of other infectious and parasitic diseases: Secondary | ICD-10-CM

## 2019-07-18 DIAGNOSIS — E109 Type 1 diabetes mellitus without complications: Secondary | ICD-10-CM | POA: Diagnosis present

## 2019-07-18 DIAGNOSIS — K8000 Calculus of gallbladder with acute cholecystitis without obstruction: Secondary | ICD-10-CM | POA: Diagnosis present

## 2019-07-18 DIAGNOSIS — Z1624 Resistance to multiple antibiotics: Secondary | ICD-10-CM

## 2019-07-18 DIAGNOSIS — I5042 Chronic combined systolic (congestive) and diastolic (congestive) heart failure: Secondary | ICD-10-CM | POA: Diagnosis present

## 2019-07-18 DIAGNOSIS — H409 Unspecified glaucoma: Secondary | ICD-10-CM | POA: Diagnosis present

## 2019-07-18 DIAGNOSIS — Z993 Dependence on wheelchair: Secondary | ICD-10-CM

## 2019-07-18 LAB — COMPREHENSIVE METABOLIC PANEL
ALT: 41 U/L (ref 0–44)
AST: 25 U/L (ref 15–41)
Albumin: 3.5 g/dL (ref 3.5–5.0)
Alkaline Phosphatase: 433 U/L — ABNORMAL HIGH (ref 38–126)
Anion gap: 11 (ref 5–15)
BUN: 14 mg/dL (ref 6–20)
CO2: 28 mmol/L (ref 22–32)
Calcium: 9.2 mg/dL (ref 8.9–10.3)
Chloride: 100 mmol/L (ref 98–111)
Creatinine, Ser: 1.16 mg/dL — ABNORMAL HIGH (ref 0.44–1.00)
GFR calc Af Amer: >60 mL/min (ref >60)
GFR calc non Af Amer: 53 mL/min — ABNORMAL LOW (ref >60)
Glucose, Bld: 163 mg/dL — ABNORMAL HIGH (ref 70–99)
Potassium: 3.7 mmol/L (ref 3.5–5.1)
Sodium: 139 mmol/L (ref 135–145)
Total Bilirubin: 0.9 mg/dL (ref 0.3–1.2)
Total Protein: 7.7 g/dL (ref 6.5–8.1)

## 2019-07-18 LAB — CBC
HCT: 35.3 % — ABNORMAL LOW (ref 36.0–46.0)
Hemoglobin: 11.4 g/dL — ABNORMAL LOW (ref 12.0–15.0)
MCH: 27.7 pg (ref 26.0–34.0)
MCHC: 32.3 g/dL (ref 30.0–36.0)
MCV: 85.7 fL (ref 80.0–100.0)
Platelets: 471 10*3/uL — ABNORMAL HIGH (ref 150–400)
RBC: 4.12 MIL/uL (ref 3.87–5.11)
RDW: 16.7 % — ABNORMAL HIGH (ref 11.5–15.5)
WBC: 13.7 10*3/uL — ABNORMAL HIGH (ref 4.0–10.5)
nRBC: 0 % (ref 0.0–0.2)

## 2019-07-18 LAB — I-STAT BETA HCG BLOOD, ED (MC, WL, AP ONLY): I-stat hCG, quantitative: <5 m[IU]/mL (ref <5)

## 2019-07-18 LAB — LIPASE, BLOOD: Lipase: 14 U/L (ref 11–51)

## 2019-07-18 MED ORDER — ONDANSETRON HCL 4 MG/2ML IJ SOLN
4.0000 mg | Freq: Once | INTRAMUSCULAR | Status: AC
  Administered 2019-07-19: 03:00:00 4 mg via INTRAVENOUS
  Filled 2019-07-18: qty 2

## 2019-07-18 MED ORDER — FENTANYL CITRATE (PF) 100 MCG/2ML IJ SOLN
50.0000 ug | Freq: Once | INTRAMUSCULAR | Status: AC
  Administered 2019-07-19: 50 ug via INTRAVENOUS
  Filled 2019-07-18: qty 2

## 2019-07-18 MED ORDER — SODIUM CHLORIDE 0.9% FLUSH
3.0000 mL | Freq: Once | INTRAVENOUS | Status: AC
  Administered 2019-07-19: 3 mL via INTRAVENOUS

## 2019-07-18 MED ORDER — SODIUM CHLORIDE 0.9 % IV BOLUS
500.0000 mL | Freq: Once | INTRAVENOUS | Status: AC
  Administered 2019-07-19: 500 mL via INTRAVENOUS

## 2019-07-18 NOTE — ED Provider Notes (Signed)
[facility] DEPT Provider Note   CSN: [id number] Arrival date & time: 07/18/19  1959   Time seen 11:26 PM  History Chief Complaint  Patient presents with  . Abdominal Pain    Shannon Garrett is a 59 y.o. female.  HPI Patient states she recently had C. difficile and states it was treated.  She states she started having right-sided abdominal pain yesterday that continued today.  She holds her hand in her right upper quadrant and states it radiates into her back.  She has had nausea without vomiting.  She had a normal bowel movement yesterday.  She denies fever.  She states movement and deep breathing makes the pain worse, nothing makes it feel better.  She states she had similar pain about 2 weeks ago.  She states she did not eat today because of the pain.  Patient is specifically asking for IV pain medication.  PCP Gildardo Cranker, DO     Past Medical History:  Diagnosis Date  . Cardiomyopathy ([place])    a.  Echo 04/29/12: Mild LVH, EF 20-25%, mild AI, moderate MR, moderate LAE, mild RAE, mild RVE, moderate TR, PASP 51, small pericardial effusion;   b. probably non-ischemic given multiple chemo-Tx agents used for MS and global LV dysfn on echo  . Chronic systolic heart failure ([place])   . COVID-19 05/10/2019   Per facility paperwork  . Depression   . Glaucoma   . Hypertension   . MS (multiple sclerosis) ([place])    a. Dx'd late 20's. b. Tx with Novantrone, Tysabri, Copaxone previously.    Patient Active Problem List   Diagnosis Date Noted  . Acute calculous cholecystitis 07/19/2019  . Enteritis due to Clostridium difficile 06/24/2019  . COVID-19 virus infection 06/24/2019  . Palliative care by specialist   . DNR (do not resuscitate)   . Sepsis ([place]) 05/10/2019  . Dysesthesia 08/26/2018  . Dyslipidemia 06/13/2017  . Elevated alkaline phosphatase level 06/13/2017  . Acute frontal sinusitis 05/24/2017  . Influenza 04/19/2017  . Glaucoma 04/06/2017  .  Allergic rhinitis due to allergen 03/07/2017  . Vaginal candidiasis 03/01/2017  . UTI (urinary tract infection) 01/02/2017  . High risk medication use 01/25/2016  . Neuropathy 11/13/2015  . GERD (gastroesophageal reflux disease) 11/13/2015  . Dysphagia as late effect of stroke 11/08/2015  . Adrenal insufficiency (Addison's disease) ([place]) 08/12/2015  . Vitamin D deficiency 06/18/2015  . Hypokalemia 06/03/2015  . Chronic anemia 05/30/2015  . Chronic pain syndrome 03/03/2015  . Essential hypertension   . Chronic constipation 11/26/2014  . Depression 11/26/2014  . Adult failure to thrive 07/31/2013  . Insomnia 06/03/2013  . Neurogenic bladder 06/03/2013  . Paraplegia ([place]) 09/05/2012  . Chronic combined systolic and diastolic congestive heart failure ([place]) 05/22/2012  . Obesity 11/30/2008  . MS (multiple sclerosis) ([place]) 11/30/2008  . Hypertensive heart disease with CHF (congestive heart failure) ([place]) 11/30/2008    Past Surgical History:  Procedure Laterality Date  . ABLATION     uterine  . CESAREAN SECTION       OB History   No obstetric history on file.     Family History  Problem Relation Age of Onset  . Hypertension Mother   . Cancer Mother        breast   . Cancer Father        prostate  . Multiple sclerosis Sister   . Heart attack Neg Hx     Social History   Tobacco Use  .  Smoking status: Former Smoker    Packs/day: 0.50    Years: 5.00    Pack years: 2.50  . Smokeless tobacco: Never Used  Substance Use Topics  . Alcohol use: No  . Drug use: No  Lives in a nursing facility  Home Medications Prior to Admission medications   Medication Sig Start Date End Date Taking? Authorizing Provider  acetaminophen (TYLENOL) 325 MG tablet Take 650 mg by mouth 3 (three) times daily.    [provider]  albuterol (PROVENTIL) (2.5 MG/3ML) 0.083% nebulizer solution Take 3 mLs (2.5 mg total) by nebulization every 2 (two) hours as needed for wheezing. 11/10/15    Regalado, Belkys A, MD  amantadine (SYMMETREL) 100 MG capsule Take 100 mg by mouth daily.     [provider]  ascorbic acid (VITAMIN C) 500 MG tablet Take 500 mg by mouth 2 (two) times daily.    [provider]  b complex vitamins tablet Take 1 tablet by mouth daily.    [provider]  Baclofen 5 MG TABS Take 5 mg by mouth 4 (four) times daily. Hold if lethargic or confused  12/04/17   [provider]  buPROPion (WELLBUTRIN XL) 150 MG 24 hr tablet Take 150 mg by mouth daily after breakfast.    [provider]  carvedilol (COREG) 25 MG tablet Take 1 tablet (25 mg total) by mouth 2 (two) times daily with a meal. 07/25/15   Rai, Ripudeep K, MD  Cholecalciferol (VITAMIN D-3 PO) Take 1,000 Units by mouth at bedtime.     [provider]  CRANBERRY PO Take 100 mg by mouth at bedtime.     [provider]  DULOXETINE HCL PO Take 90 mg by mouth daily.  03/07/18   [provider]  famotidine (PEPCID) 20 MG tablet Take 1 tablet (20 mg total) by mouth 2 (two) times daily. 07/25/15   Rai, Vernelle Emerald, MD  ferrous sulfate 325 (65 FE) MG tablet Take 325 mg by mouth daily with breakfast. 02/20/18   [provider]  fluticasone (FLONASE) 50 MCG/ACT nasal spray Place 2 sprays into both nostrils every evening.    [provider]  gabapentin (NEURONTIN) 800 MG tablet Take 800 mg by mouth 3 (three) times daily.    [provider]  insulin aspart (NOVOLOG FLEXPEN) 100 UNIT/ML FlexPen 0-15 Units, Subcutaneous, 3 times daily with meals CBG < 70: implement hypoglycemia protocol-call MD CBG 70 - 120: 0 units CBG 121 - 150: 2 units CBG 151 - 200: 3 units CBG 201 - 250: 5 units CBG 251 - 300: 8 units CBG 301 - 350: 11 units CBG 351 - 400: 15 units CBG > 400: 05/18/19   Ghimire, Henreitta Leber, MD  insulin glargine (LANTUS) 100 UNIT/ML injection Inject 0.1 mLs (10 Units total) into the skin daily. 05/19/19   Ghimire, Henreitta Leber, MD    Lactase (LACTAID PO) Take 1 tablet by mouth at bedtime.     [provider]  Lactobacillus (PROBIOTIC ACIDOPHILUS) CAPS Take 1 capsule by mouth every other day.    [provider]  latanoprost (XALATAN) 0.005 % ophthalmic solution Place 1 drop into both eyes at bedtime.    [provider]  linaclotide Rolan Lipa) 290 MCG CAPS capsule Take 290 mcg by mouth daily before breakfast.  11/20/17   [provider]  Melatonin 5 MG TABS Take 10 mg by mouth at bedtime.  11/14/17   [provider]  Multiple Vitamins-Minerals (MULTIVITAMIN ADULT  PO) Take 1 tablet by mouth daily.    [provider]  omeprazole (PRILOSEC) 20 MG capsule Take 20 mg by mouth daily.    [provider]  ondansetron (ZOFRAN) 4 MG tablet Take 4 mg by mouth every 6 (six) hours as needed for nausea or vomiting. 11/01/17   [provider]  potassium chloride (KLOR-CON) 20 MEQ packet Take 20 mEq by mouth daily.     [provider]  predniSONE (DELTASONE) 10 MG tablet Take 40 mg daily for 1 day, 30 mg daily for 1 day, 20 mg daily for 1 days,10 mg daily for 1 day, then stop 05/18/19   Ghimire, Henreitta Leber, MD  Quercetin 250 MG TABS Take 250 mg by mouth 2 (two) times daily.    [provider]  senna-docusate (SENOKOT S) 8.6-50 MG tablet Take 2 tablets by mouth 2 (two) times daily.     [provider]  spironolactone (ALDACTONE) 25 MG tablet Take 25 mg by mouth daily. For CHF    [provider]  tamsulosin (FLOMAX) 0.4 MG CAPS capsule Take 0.4 mg by mouth daily. For incontinence    [provider]  torsemide (DEMADEX) 20 MG tablet Take 40 mg by mouth daily.     [provider]  traMADol (ULTRAM) 50 MG tablet Take 1 tablet (50 mg total) by mouth every 8 (eight) hours as needed for moderate pain or severe pain. 05/18/19   Ghimire, Henreitta Leber, MD  vancomycin (VANCOCIN) 125 MG capsule Take 125 mg by mouth 4 (four) times daily.     [provider]  zinc sulfate 220 (50 Zn) MG capsule Take 220 mg by mouth daily.    [provider]    Allergies    Sulfonamide derivatives and Penicillins  Review of Systems   Review of Systems  All other systems reviewed and are negative.   Physical Exam Updated Vital Signs BP 127/74 (BP Location: Left Wrist)   Pulse (!) 126   Temp 99 F (37.2 C) (Oral)   Resp (!) 22   SpO2 98%   Physical Exam Vitals and nursing note reviewed.  Constitutional:      General: She is not in acute distress.    Appearance: Normal appearance. She is well-developed. She is obese. She is not ill-appearing or toxic-appearing.  HENT:     Head: Normocephalic and atraumatic.     Right Ear: External ear normal.     Left Ear: External ear normal.     Nose: Nose normal. No mucosal edema or rhinorrhea.     Mouth/Throat:     Dentition: No dental abscesses.     Pharynx: No uvula swelling.  Eyes:     Extraocular Movements: Extraocular movements intact.     Conjunctiva/sclera: Conjunctivae normal.     Pupils: Pupils are equal, round, and reactive to light.  Cardiovascular:     Rate and Rhythm: Normal rate and regular rhythm.     Pulses: Normal pulses.     Heart sounds: Normal heart sounds. No murmur. No friction rub. No gallop.   Pulmonary:     Effort: Pulmonary effort is normal. No respiratory distress.     Breath sounds: Normal breath sounds. No wheezing, rhonchi or rales.  Chest:     Chest wall: No tenderness or crepitus.  Abdominal:     General: Bowel sounds are normal. There is distension.     Palpations: Abdomen is soft.     Tenderness: There is generalized abdominal  tenderness. There is no guarding or rebound.     Comments: Patient is tender diffusely but she states it hurts more in the right upper quadrant.  Musculoskeletal:        General: No tenderness. Normal range of motion.     Cervical back: Full passive range of motion without pain, normal range of motion and neck  supple.     Comments: Moves all extremities well.   Skin:    General: Skin is warm and dry.     Coloration: Skin is not pale.     Findings: No erythema or rash.  Neurological:     Mental Status: She is alert and oriented to person, place, and time.     Cranial Nerves: No cranial nerve deficit.  Psychiatric:        Mood and Affect: Mood is not anxious.        Speech: Speech normal.        Behavior: Behavior normal.     ED Results / Procedures / Treatments   Labs (all labs ordered are listed, but only abnormal results are displayed) Results for orders placed or performed during the hospital encounter of 07/18/19  Lipase, blood  Result Value Ref Range   Lipase 14 11 - 51 U/L  Comprehensive metabolic panel  Result Value Ref Range   Sodium 139 135 - 145 mmol/L   Potassium 3.7 3.5 - 5.1 mmol/L   Chloride 100 98 - 111 mmol/L   CO2 28 22 - 32 mmol/L   Glucose, Bld 163 (H) 70 - 99 mg/dL   BUN 14 6 - 20 mg/dL   Creatinine, Ser 1.16 (H) 0.44 - 1.00 mg/dL   Calcium 9.2 8.9 - 10.3 mg/dL   Total Protein 7.7 6.5 - 8.1 g/dL   Albumin 3.5 3.5 - 5.0 g/dL   AST 25 15 - 41 U/L   ALT 41 0 - 44 U/L   Alkaline Phosphatase 433 (H) 38 - 126 U/L   Total Bilirubin 0.9 0.3 - 1.2 mg/dL   GFR calc non Af Amer 53 (L) >60 mL/min   GFR calc Af Amer >60 >60 mL/min   Anion gap 11 5 - 15  CBC  Result Value Ref Range   WBC 13.7 (H) 4.0 - 10.5 K/uL   RBC 4.12 3.87 - 5.11 MIL/uL   Hemoglobin 11.4 (L) 12.0 - 15.0 g/dL   HCT 35.3 (L) 36.0 - 46.0 %   MCV 85.7 80.0 - 100.0 fL   MCH 27.7 26.0 - 34.0 pg   MCHC 32.3 30.0 - 36.0 g/dL   RDW 16.7 (H) 11.5 - 15.5 %   Platelets 471 (H) 150 - 400 K/uL   nRBC 0.0 0.0 - 0.2 %  I-Stat beta hCG blood, ED  Result Value Ref Range   I-stat hCG, quantitative <5.0 <5 mIU/mL   Comment 3            Laboratory interpretation all normal except leukocytosis, mild anemia, mild renal insufficiency    EKG None  Radiology US Abdomen Limited RUQ  Result Date:  07/19/2019 CLINICAL DATA:  Right upper quadrant pain. Gallstones on recent CT. Elevated white blood cell count. EXAM: ULTRASOUND ABDOMEN LIMITED RIGHT UPPER QUADRANT COMPARISON:  Abdominal CT 06/29/2019 FINDINGS: Gallbladder: Distended with multiple intraluminal gallstones. Mild gallbladder wall thickening of 3-4 mm. No pericholecystic fluid. Positive sonographic Murphy sign noted by sonographer. Common bile duct: Diameter: 3 mm, normal. Liver: Heterogeneously increased in parenchymal echogenicity. The liver parenchyma is difficult to  penetrate. No evidence of focal lesion. Portal vein is patent on color Doppler imaging with normal direction of blood flow towards the liver. Other: None. IMPRESSION: 1. Gallstones with mild gallbladder wall thickening and positive sonographic Murphy sign. Findings are consistent with acute cholecystitis in the appropriate clinical setting. 2. No biliary dilatation. 3. Hepatic steatosis. Electronically Signed   By: Keith Rake M.D.   On: 07/19/2019 00:50     CT Abdomen Pelvis W Contrast  Result Date: 06/29/2019 CLINICAL DATA:  Right-sided abdominal pain and recent C difficile infection  IMPRESSION: Stable cholelithiasis without complicating factors. Calcified uterine fibroids. Fatty infiltration of the liver. No acute abnormality noted. Electronically Signed   By: Inez Catalina M.D.   On: 06/29/2019 10:28   Procedures Procedures (including critical care time)  Medications Ordered in ED Medications  sodium chloride flush (NS) 0.9 % injection 3 mL (has no administration in time range)  ciprofloxacin (CIPRO) IVPB 400 mg (400 mg Intravenous New Bag/Given 07/19/19 0309)  sodium chloride 0.9 % bolus 500 mL (500 mLs Intravenous New Bag/Given 07/19/19 0304)  fentaNYL (SUBLIMAZE) injection 50 mcg (50 mcg Intravenous Given 07/19/19 0224)  ondansetron (ZOFRAN) injection 4 mg (4 mg Intravenous Given 07/19/19 0306)    ED Course  I have reviewed the triage vital signs and the  nursing notes.  Pertinent labs & imaging results that were available during my care of the patient were reviewed by me and considered in my medical decision making (see chart for details).    MDM Rules/Calculators/A&P                      Review of the [place] shows no prescriptions noted in the past 2 years.  However looking at her medication list she was given tramadol in November 2020  Right upper quadrant ultrasound was done to look for inflammatory changes of the gallbladder.  She had known gallstones on a CT scan done on December 28.  Patient has penicillin allergy she was given IV Cipro for possible cholecystitis as read on her ultrasound.  I talked to patient about her diagnosis and she is agreeable to be admitted.  3:43 AM Dr. Myna Hidalgo, hospitalist will admit.   Final Clinical Impression(s) / ED Diagnoses Final diagnoses:  Cholecystitis    Rx / DC Orders Plan admission  Rolland Porter, MD, Barbette Or, MD 07/19/19 [phone]

## 2019-07-18 NOTE — ED Triage Notes (Addendum)
Pt BIB [facility] from [place] for abdominal pain. She states that it started yesterday after dinner. She has not eaten today. She states that pain is mostly on the R side. A&Ox4. Hx of MS, nonambulatory. Denies N/V/D.

## 2019-07-19 ENCOUNTER — Emergency Department (HOSPITAL_COMMUNITY): Payer: Medicare (Managed Care)

## 2019-07-19 ENCOUNTER — Inpatient Hospital Stay (HOSPITAL_COMMUNITY): Payer: Medicare (Managed Care)

## 2019-07-19 DIAGNOSIS — A419 Sepsis, unspecified organism: Principal | ICD-10-CM

## 2019-07-19 DIAGNOSIS — Z1624 Resistance to multiple antibiotics: Secondary | ICD-10-CM | POA: Diagnosis present

## 2019-07-19 DIAGNOSIS — K8000 Calculus of gallbladder with acute cholecystitis without obstruction: Secondary | ICD-10-CM | POA: Diagnosis present

## 2019-07-19 DIAGNOSIS — G894 Chronic pain syndrome: Secondary | ICD-10-CM | POA: Diagnosis present

## 2019-07-19 DIAGNOSIS — I428 Other cardiomyopathies: Secondary | ICD-10-CM | POA: Diagnosis present

## 2019-07-19 DIAGNOSIS — K819 Cholecystitis, unspecified: Secondary | ICD-10-CM | POA: Diagnosis present

## 2019-07-19 DIAGNOSIS — Z79899 Other long term (current) drug therapy: Secondary | ICD-10-CM | POA: Diagnosis not present

## 2019-07-19 DIAGNOSIS — E11649 Type 2 diabetes mellitus with hypoglycemia without coma: Secondary | ICD-10-CM | POA: Diagnosis not present

## 2019-07-19 DIAGNOSIS — Z794 Long term (current) use of insulin: Secondary | ICD-10-CM | POA: Diagnosis not present

## 2019-07-19 DIAGNOSIS — I10 Essential (primary) hypertension: Secondary | ICD-10-CM | POA: Diagnosis not present

## 2019-07-19 DIAGNOSIS — K219 Gastro-esophageal reflux disease without esophagitis: Secondary | ICD-10-CM | POA: Diagnosis present

## 2019-07-19 DIAGNOSIS — I361 Nonrheumatic tricuspid (valve) insufficiency: Secondary | ICD-10-CM | POA: Diagnosis not present

## 2019-07-19 DIAGNOSIS — I340 Nonrheumatic mitral (valve) insufficiency: Secondary | ICD-10-CM

## 2019-07-19 DIAGNOSIS — I110 Hypertensive heart disease with heart failure: Secondary | ICD-10-CM | POA: Diagnosis not present

## 2019-07-19 DIAGNOSIS — I5032 Chronic diastolic (congestive) heart failure: Secondary | ICD-10-CM | POA: Diagnosis not present

## 2019-07-19 DIAGNOSIS — G8220 Paraplegia, unspecified: Secondary | ICD-10-CM | POA: Diagnosis present

## 2019-07-19 DIAGNOSIS — Z8616 Personal history of COVID-19: Secondary | ICD-10-CM | POA: Diagnosis not present

## 2019-07-19 DIAGNOSIS — Z01810 Encounter for preprocedural cardiovascular examination: Secondary | ICD-10-CM | POA: Diagnosis not present

## 2019-07-19 DIAGNOSIS — N179 Acute kidney failure, unspecified: Secondary | ICD-10-CM | POA: Diagnosis present

## 2019-07-19 DIAGNOSIS — I5042 Chronic combined systolic (congestive) and diastolic (congestive) heart failure: Secondary | ICD-10-CM | POA: Diagnosis present

## 2019-07-19 DIAGNOSIS — E109 Type 1 diabetes mellitus without complications: Secondary | ICD-10-CM | POA: Diagnosis not present

## 2019-07-19 DIAGNOSIS — G35 Multiple sclerosis: Secondary | ICD-10-CM

## 2019-07-19 DIAGNOSIS — Z7401 Bed confinement status: Secondary | ICD-10-CM | POA: Diagnosis not present

## 2019-07-19 DIAGNOSIS — N390 Urinary tract infection, site not specified: Secondary | ICD-10-CM | POA: Diagnosis not present

## 2019-07-19 DIAGNOSIS — L89152 Pressure ulcer of sacral region, stage 2: Secondary | ICD-10-CM | POA: Diagnosis not present

## 2019-07-19 DIAGNOSIS — H409 Unspecified glaucoma: Secondary | ICD-10-CM | POA: Diagnosis present

## 2019-07-19 DIAGNOSIS — Z79890 Hormone replacement therapy (postmenopausal): Secondary | ICD-10-CM | POA: Diagnosis not present

## 2019-07-19 DIAGNOSIS — Z993 Dependence on wheelchair: Secondary | ICD-10-CM | POA: Diagnosis not present

## 2019-07-19 DIAGNOSIS — Z8619 Personal history of other infectious and parasitic diseases: Secondary | ICD-10-CM

## 2019-07-19 DIAGNOSIS — B961 Klebsiella pneumoniae [K. pneumoniae] as the cause of diseases classified elsewhere: Secondary | ICD-10-CM | POA: Diagnosis present

## 2019-07-19 DIAGNOSIS — Z66 Do not resuscitate: Secondary | ICD-10-CM | POA: Diagnosis not present

## 2019-07-19 LAB — URINALYSIS, ROUTINE W REFLEX MICROSCOPIC
Bilirubin Urine: NEGATIVE
Glucose, UA: NEGATIVE mg/dL
Hgb urine dipstick: NEGATIVE
Ketones, ur: NEGATIVE mg/dL
Nitrite: NEGATIVE
Protein, ur: NEGATIVE mg/dL
Specific Gravity, Urine: 1.016 (ref 1.005–1.030)
WBC, UA: >50 WBC/hpf — ABNORMAL HIGH (ref 0–5)
pH: 5 (ref 5.0–8.0)

## 2019-07-19 LAB — CBC WITH DIFFERENTIAL/PLATELET
Abs Immature Granulocytes: 0.12 10*3/uL — ABNORMAL HIGH (ref 0.00–0.07)
Basophils Absolute: 0.2 10*3/uL — ABNORMAL HIGH (ref 0.0–0.1)
Basophils Relative: 1 %
Eosinophils Absolute: 0.3 10*3/uL (ref 0.0–0.5)
Eosinophils Relative: 2 %
HCT: 33.9 % — ABNORMAL LOW (ref 36.0–46.0)
Hemoglobin: 11.1 g/dL — ABNORMAL LOW (ref 12.0–15.0)
Immature Granulocytes: 1 %
Lymphocytes Relative: 11 %
Lymphs Abs: 1.6 10*3/uL (ref 0.7–4.0)
MCH: 27.8 pg (ref 26.0–34.0)
MCHC: 32.7 g/dL (ref 30.0–36.0)
MCV: 85 fL (ref 80.0–100.0)
Monocytes Absolute: 1.2 10*3/uL — ABNORMAL HIGH (ref 0.1–1.0)
Monocytes Relative: 8 %
Neutro Abs: 11.7 10*3/uL — ABNORMAL HIGH (ref 1.7–7.7)
Neutrophils Relative %: 77 %
Platelets: 482 10*3/uL — ABNORMAL HIGH (ref 150–400)
RBC: 3.99 MIL/uL (ref 3.87–5.11)
RDW: 16.7 % — ABNORMAL HIGH (ref 11.5–15.5)
WBC: 15.1 10*3/uL — ABNORMAL HIGH (ref 4.0–10.5)
nRBC: 0 % (ref 0.0–0.2)

## 2019-07-19 LAB — GLUCOSE, CAPILLARY
Glucose-Capillary: 165 mg/dL — ABNORMAL HIGH (ref 70–99)
Glucose-Capillary: 169 mg/dL — ABNORMAL HIGH (ref 70–99)
Glucose-Capillary: 137 mg/dL — ABNORMAL HIGH (ref 70–99)
Glucose-Capillary: 154 mg/dL — ABNORMAL HIGH (ref 70–99)

## 2019-07-19 LAB — CBG MONITORING, ED: Glucose-Capillary: 169 mg/dL — ABNORMAL HIGH (ref 70–99)

## 2019-07-19 LAB — ECHOCARDIOGRAM COMPLETE

## 2019-07-19 MED ORDER — INSULIN ASPART 100 UNIT/ML ~~LOC~~ SOLN
0.0000 [IU] | SUBCUTANEOUS | Status: DC
  Administered 2019-07-19: 12:00:00 2 [IU] via SUBCUTANEOUS
  Administered 2019-07-19 (×2): 1 [IU] via SUBCUTANEOUS
  Administered 2019-07-19: 2 [IU] via SUBCUTANEOUS
  Administered 2019-07-20 (×2): 3 [IU] via SUBCUTANEOUS
  Administered 2019-07-20 – 2019-07-21 (×4): 1 [IU] via SUBCUTANEOUS
  Administered 2019-07-21 – 2019-07-22 (×4): 2 [IU] via SUBCUTANEOUS
  Administered 2019-07-22 – 2019-07-23 (×3): 1 [IU] via SUBCUTANEOUS
  Filled 2019-07-19: qty 0.09

## 2019-07-19 MED ORDER — HYDROMORPHONE HCL 1 MG/ML IJ SOLN
1.0000 mg | INTRAMUSCULAR | Status: DC | PRN
  Administered 2019-07-19 – 2019-07-22 (×11): 1 mg via INTRAVENOUS
  Filled 2019-07-19 (×12): qty 1

## 2019-07-19 MED ORDER — METOPROLOL TARTRATE 25 MG PO TABS
25.0000 mg | ORAL_TABLET | Freq: Two times a day (BID) | ORAL | Status: DC
  Administered 2019-07-19: 25 mg via ORAL
  Filled 2019-07-19: qty 1

## 2019-07-19 MED ORDER — ACETAMINOPHEN 500 MG PO TABS
1000.0000 mg | ORAL_TABLET | ORAL | Status: AC
  Administered 2019-07-20: 1000 mg via ORAL
  Filled 2019-07-19: qty 2

## 2019-07-19 MED ORDER — FENTANYL CITRATE (PF) 100 MCG/2ML IJ SOLN
25.0000 ug | INTRAMUSCULAR | Status: DC | PRN
  Administered 2019-07-19 (×3): 50 ug via INTRAVENOUS
  Filled 2019-07-19 (×4): qty 2

## 2019-07-19 MED ORDER — METOPROLOL TARTRATE 50 MG PO TABS
75.0000 mg | ORAL_TABLET | Freq: Two times a day (BID) | ORAL | Status: DC
  Administered 2019-07-19 – 2019-07-23 (×8): 75 mg via ORAL
  Filled 2019-07-19 (×8): qty 1

## 2019-07-19 MED ORDER — ACETAMINOPHEN 650 MG RE SUPP: 650.0000 mg | Freq: Four times a day (QID) | RECTAL | Status: DC | PRN

## 2019-07-19 MED ORDER — SODIUM CHLORIDE 0.9% FLUSH
3.0000 mL | Freq: Two times a day (BID) | INTRAVENOUS | Status: DC
  Administered 2019-07-22 – 2019-07-23 (×2): 3 mL via INTRAVENOUS

## 2019-07-19 MED ORDER — METOPROLOL TARTRATE 25 MG PO TABS
25.0000 mg | ORAL_TABLET | Freq: Once | ORAL | Status: AC
  Administered 2019-07-19: 25 mg via ORAL
  Filled 2019-07-19: qty 1

## 2019-07-19 MED ORDER — SODIUM CHLORIDE 0.9 % IV SOLN: INTRAVENOUS | Status: AC

## 2019-07-19 MED ORDER — GABAPENTIN 300 MG PO CAPS
300.0000 mg | ORAL_CAPSULE | ORAL | Status: AC
  Administered 2019-07-20: 12:00:00 300 mg via ORAL
  Filled 2019-07-19: qty 1

## 2019-07-19 MED ORDER — ONDANSETRON HCL 4 MG PO TABS: 4.0000 mg | ORAL_TABLET | Freq: Four times a day (QID) | ORAL | Status: DC | PRN

## 2019-07-19 MED ORDER — LIP MEDEX EX OINT
TOPICAL_OINTMENT | CUTANEOUS | Status: DC | PRN
  Filled 2019-07-19: qty 7

## 2019-07-19 MED ORDER — SODIUM CHLORIDE 0.9 % IV SOLN
1.0000 g | Freq: Three times a day (TID) | INTRAVENOUS | Status: DC
  Administered 2019-07-19 – 2019-07-23 (×13): 1 g via INTRAVENOUS
  Filled 2019-07-19 (×17): qty 1

## 2019-07-19 MED ORDER — CHLORHEXIDINE GLUCONATE CLOTH 2 % EX PADS
6.0000 | MEDICATED_PAD | Freq: Once | CUTANEOUS | Status: AC
  Administered 2019-07-20: 6 via TOPICAL

## 2019-07-19 MED ORDER — CIPROFLOXACIN IN D5W 400 MG/200ML IV SOLN
400.0000 mg | Freq: Once | INTRAVENOUS | Status: AC
  Administered 2019-07-19: 400 mg via INTRAVENOUS
  Filled 2019-07-19: qty 200

## 2019-07-19 MED ORDER — ACETAMINOPHEN 325 MG PO TABS
650.0000 mg | ORAL_TABLET | Freq: Four times a day (QID) | ORAL | Status: DC | PRN
  Administered 2019-07-19: 650 mg via ORAL
  Filled 2019-07-19: qty 2

## 2019-07-19 MED ORDER — METOPROLOL TARTRATE 50 MG PO TABS: 50.0000 mg | ORAL_TABLET | Freq: Two times a day (BID) | ORAL | Status: DC

## 2019-07-19 MED ORDER — METOPROLOL TARTRATE 5 MG/5ML IV SOLN
5.0000 mg | INTRAVENOUS | Status: DC | PRN
  Administered 2019-07-19 (×2): 5 mg via INTRAVENOUS
  Filled 2019-07-19 (×2): qty 5

## 2019-07-19 MED ORDER — ONDANSETRON HCL 4 MG/2ML IJ SOLN
4.0000 mg | Freq: Four times a day (QID) | INTRAMUSCULAR | Status: DC | PRN
  Administered 2019-07-19: 12:00:00 4 mg via INTRAVENOUS
  Filled 2019-07-19: qty 2

## 2019-07-19 NOTE — Progress Notes (Signed)
Pt heart rate consistently elevated in 140's. Per MD Aileen Fass, 5mg  IV Lopressor and 25mg  PO Lopressor given and EKG completed. On chart for review. Pt heart rate now in 120's, MD aware. Will continue to monitor patient.

## 2019-07-19 NOTE — H&P (View-Only) (Signed)
Shannon Garrett  09-Jan-1961 [id number]  CARE TEAM:  PCP: Gildardo Cranker, DO  Outpatient Care Team: Patient Care Team: Gildardo Cranker, DO as PCP - [facility] [place] [facility]  Inpatient Treatment Team: Treatment Team: Attending Provider: Charlynne Cousins, MD; Rounding Team: Ian Bushman, MD; Registered Nurse: Rosana Fret, RN; Technician: Lucila Maine, NT; Consulting Physician: Edison Pace, Md, MD   This patient is a 59 y.o.female who presents today for surgical evaluation at the request of Dr Aileen Fass.   Chief complaint / Reason for evaluation: Acute cholecystitis  Woman with multiple medical problems including paraplegia with multiple sclerosis, chronic diastolic heart failure.  Hospitalized in November 2020 with urinary tract infection.  ESBL.  COVID-19.  Then developed C. difficile colitis.  Oral vancomycin.  More recently had worsening right-sided abdominal pain with nausea and vomiting.  Decreased appetite.  Ultrasound suspicious for sonographic Murphy sign and gallbladder wall thickening consistent with cholecystitis.  Medical admission.  IV antibiotics.  Placed on meropenem given history of drug-resistant infections in the past.  Surgical consultation requested this morning to consider need for cholecystectomy.  Medically cleared by Dr. Olevia Bowens.  Patient notes that she has had attacks of upper abdominal pain and nausea and vomiting the past.  Usually self-limiting went away.  However this tach would not.  Came the emergency room.  Pain and nausea is less since she was admitted this morning.  Nursing at bedside.  Patient tends to be bedridden and does not mobilize much.  Only abdominal surgery has been a C-section.  She is a diabetic insulin requiring.   Assessment  Shannon Garrett  59 y.o. 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  Plan:  IV antibiotics.  She is on IV meropenem given her history of multidrug-resistant Klebsiella urinary tract infection and C. difficile.  Probably not a good idea to put back on Cipro.  Seems to be clinically better on this continue for now.  Laparoscopic cholecystectomy this admission.  We have to emergency surgery is to still do today, so most likely will postpone till tomorrow.  Decent chance of doing for poor laparoscopic approach.  Would be my partner, Dr. Kae Heller.  Medically cleared by the primary hospitalist internal medicine service  The anatomy & physiology of hepatobiliary & pancreatic function was discussed.  The pathophysiology of gallbladder dysfunction was discussed.  Natural history risks without surgery was discussed.   I feel the risks of no intervention will lead to serious problems that outweigh the operative risks; therefore, I recommended cholecystectomy to remove the pathology.  I explained laparoscopic techniques with possible need for an open approach.  Probable cholangiogram to evaluate the bilary tract was explained as well.    Risks such as bleeding, infection, diarrhea and other bowel changes, abscess, leak, injury to other organs, need for repair of tissues / organs, need for further treatment, stroke, heart attack, death, and other risks were discussed.  I noted a good likelihood this will help address the problem, but there is a chance it may not help.  Possibility that this will not correct  all abdominal symptoms was explained.  Goals of post-operative recovery were discussed as well.  We will work to minimize complications.  An educational handout further explaining the pathology and treatment  options was given as well.  Questions were answered.  The patient expresses understanding & wishes to proceed with surgery.    -VTE prophylaxis- SCDs, etc -mobilize as tolerated to help recovery.  This is limited given her multiple sclerosis and limited mobility.  50 minutes spent in review, evaluation, examination, counseling, and coordination of care.  More than 50% of that time was spent in counseling.  Adin Hector, MD, FACS, MASCRS Gastrointestinal and Minimally Invasive Surgery  [facility] [address] [place], [place] [address] [phone] Main / Paging [phone] Fax     07/19/2019      Past Medical History:  Diagnosis Date  . Cardiomyopathy ([place])    a.  Echo 04/29/12: Mild LVH, EF 20-25%, mild AI, moderate MR, moderate LAE, mild RAE, mild RVE, moderate TR, PASP 51, small pericardial effusion;   b. probably non-ischemic given multiple chemo-Tx agents used for MS and global LV dysfn on echo  . Chronic systolic heart failure ([place])   . COVID-19 05/10/2019   Per facility paperwork  . Depression   . Glaucoma   . Hypertension   . MS (multiple sclerosis) ([place])    a. Dx'd late 20's. b. Tx with Novantrone, Tysabri, Copaxone previously.    Past Surgical History:  Procedure Laterality Date  . ABLATION     uterine  . CESAREAN SECTION      Social History   Socioeconomic History  . Marital status: Single    Spouse name: Not on file  . Number of children: Not on file  . Years of education: Not on file  . Highest education level: Not on file  Occupational History  . Not on file  Tobacco Use  . Smoking status: Former Smoker    Packs/day: 0.50    Years: 5.00    Pack years: 2.50  . Smokeless tobacco: Never Used  Substance and Sexual Activity  . Alcohol use: No  . Drug use: No  . Sexual activity: Not Currently  Other Topics Concern  . Not on file  Social History Narrative  . Not on file   Social Determinants of Health    Financial Resource Strain:   . Difficulty of Paying Living Expenses: Not on file  Food Insecurity:   . Worried About Charity fundraiser in the Last Year: Not on file  . Ran Out of Food in the Last Year: Not on file  Transportation Needs:   . Lack of Transportation (Medical): Not on file  . Lack of Transportation (Non-Medical): Not on file  Physical Activity:   . Days of Exercise per Week: Not on file  . Minutes of Exercise per Session: Not on file  Stress:   . Feeling of Stress : Not on file  Social Connections:   . Frequency of Communication with Friends and Family: Not on file  . Frequency of Social Gatherings with Friends and Family: Not on file  . Attends Religious Services: Not on file  . Active Member of Clubs or Organizations: Not on file  . Attends Archivist Meetings: Not on file  . Marital Status: Not on file  Intimate Partner Violence:   . Fear of Current or Ex-Partner: Not on file  . Emotionally Abused: Not on file  . Physically Abused:  Not on file  . Sexually Abused: Not on file    Family History  Problem Relation Age of Onset  . Hypertension Mother   . Cancer Mother        breast   . Cancer Father        prostate  . Multiple sclerosis Sister   . Heart attack Neg Hx     Current Facility-Administered Medications  Medication Dose Route Frequency Provider Last Rate Last Admin  . 0.9 %  sodium chloride infusion   Intravenous Continuous Opyd, Ilene Qua, MD 90 mL/hr at 07/19/19 0820 New Bag at 07/19/19 0820  . acetaminophen (TYLENOL) tablet 650 mg  650 mg Oral Q6H PRN Opyd, Ilene Qua, MD       Or  . acetaminophen (TYLENOL) suppository 650 mg  650 mg Rectal Q6H PRN Opyd, Ilene Qua, MD      . fentaNYL (SUBLIMAZE) injection 25-50 mcg  25-50 mcg Intravenous Q2H PRN Opyd, Ilene Qua, MD   50 mcg at 07/19/19 0828  . insulin aspart (novoLOG) injection 0-9 Units  0-9 Units Subcutaneous Q4H Opyd, Ilene Qua, MD   2 Units at 07/19/19 [phone]  . meropenem (MERREM)  1 g in sodium chloride 0.9 % 100 mL IVPB  1 g Intravenous Q8H Opyd, Ilene Qua, MD   Stopped at 07/19/19 0531  . ondansetron (ZOFRAN) tablet 4 mg  4 mg Oral Q6H PRN Opyd, Ilene Qua, MD       Or  . ondansetron (ZOFRAN) injection 4 mg  4 mg Intravenous Q6H PRN Opyd, Ilene Qua, MD      . sodium chloride flush (NS) 0.9 % injection 3 mL  3 mL Intravenous Q12H Opyd, Ilene Qua, MD         Allergies  Allergen Reactions  . Sulfonamide Derivatives Hives and Shortness Of Breath  . Penicillins Hives and Itching    Has patient had a PCN reaction causing immediate rash, facial/tongue/throat swelling, SOB or lightheadedness with hypotension: unknown Has patient had a PCN reaction causing severe rash involving mucus membranes or skin necrosis: unknown Has patient had a PCN reaction that required hospitalization: unknown Has patient had a PCN reaction occurring within the last 10 years: unknown If all of the above answers are "NO", then may proceed with Cephalosporin use. Prior course of rocephin charted 05/2015    ROS:   All other systems reviewed & are negative except per HPI or as noted below: Constitutional:  No fevers, chills, sweats.  Weight stable Eyes:  No vision changes, No discharge HENT:  No sore throats, nasal drainage Lymph: No neck swelling, No bruising easily Pulmonary:  No cough, productive sputum CV: No orthopnea, PND  No exertional chest/neck/shoulder/arm pain. GI:  No personal nor family history of GI/colon cancer, inflammatory bowel disease, irritable bowel syndrome, allergy such as Celiac Sprue, dietary/dairy problems, ulcers nor gastritis.   No travel outside the country.  No changes in diet. Renal: UTIs as noted above.  Not currently., No hematuria Genital:  No drainage, bleeding, masses Musculoskeletal: Limited mobility with multiple sclerosis.  Moderate chronic hip and low back pain. Skin:  No sores or lesions.  No rashes Heme/Lymph:  No easy bleeding.  No swollen lymph  nodes Neuro: No focal weakness/numbness.  No seizures.  Paraplegic Psych: No suicidal ideation.  No hallucinations  BP (!) 158/89 (BP Location: Right Arm)   Pulse (!) 137   Temp 98.7 F (37.1 C) (Oral)   Resp 20   SpO2 97%  Physical Exam: General: Pt awake/alert/oriented x4 in no major acute distress Eyes: PERRL, normal EOM. Sclera nonicteric Neuro: CN II-XII intact w/o focal sensory/motor deficits.  Has some delayed response time and slow slurred speech.  Paraplegic Lymph: No head/neck/groin lymphadenopathy Psych:  No delerium/psychosis/paranoia HENT: Normocephalic, Mucus membranes moist.  No thrush Neck: Supple, No tracheal deviation Chest: No pain.  Good respiratory excursion. CV:  Pulses intact.  Regular rhythm Abdomen: Morbidly obese but soft.  Moderately distended.  Tenderness in right upper quadrant with Murphy sign.  Left of the abdomen is nontender.  Moderate diastases recti.  No incarcerated hernias. Gen:  No inguinal hernias.  No inguinal lymphadenopathy.   Ext:  SCDs BLE.  No significant edema.  No cyanosis Skin: No petechiae / purpurea.  No major sores Musculoskeletal: Moderate chronic hip and low back joint pain.  Fair range of motion on lower extremities   Results:   Labs: Results for orders placed or performed during the hospital encounter of 07/18/19 (from the past 48 hour(s))  Lipase, blood     Status: None   Collection Time: 07/18/19  9:51 PM  Result Value Ref Range   Lipase 14 11 - 51 U/L    Comment: Performed at [facility], [place] [address] [place], [place] [address]  Comprehensive metabolic panel     Status: Abnormal   Collection Time: 07/18/19  9:51 PM  Result Value Ref Range   Sodium 139 135 - 145 mmol/L   Potassium 3.7 3.5 - 5.1 mmol/L   Chloride 100 98 - 111 mmol/L   CO2 28 22 - 32 mmol/L   Glucose, Bld 163 (H) 70 - 99 mg/dL   BUN 14 6 - 20 mg/dL   Creatinine, Ser 1.16 (H) 0.44 - 1.00 mg/dL   Calcium 9.2 8.9 - 10.3 mg/dL    Total Protein 7.7 6.5 - 8.1 g/dL   Albumin 3.5 3.5 - 5.0 g/dL   AST 25 15 - 41 U/L   ALT 41 0 - 44 U/L   Alkaline Phosphatase 433 (H) 38 - 126 U/L   Total Bilirubin 0.9 0.3 - 1.2 mg/dL   GFR calc non Af Amer 53 (L) >60 mL/min   GFR calc Af Amer >60 >60 mL/min   Anion gap 11 5 - 15    Comment: Performed at [facility], [place] [address] [place], [place] [address]  CBC     Status: Abnormal   Collection Time: 07/18/19  9:51 PM  Result Value Ref Range   WBC 13.7 (H) 4.0 - 10.5 K/uL   RBC 4.12 3.87 - 5.11 MIL/uL   Hemoglobin 11.4 (L) 12.0 - 15.0 g/dL   HCT 35.3 (L) 36.0 - 46.0 %   MCV 85.7 80.0 - 100.0 fL   MCH 27.7 26.0 - 34.0 pg   MCHC 32.3 30.0 - 36.0 g/dL   RDW 16.7 (H) 11.5 - 15.5 %   Platelets 471 (H) 150 - 400 K/uL   nRBC 0.0 0.0 - 0.2 %    Comment: Performed at [facility], [place] [address], [place],  [address]  I-Stat beta hCG blood, ED     Status: None   Collection Time: 07/18/19 10:07 PM  Result Value Ref Range   I-stat hCG, quantitative <5.0 <5 mIU/mL   Comment 3            Comment:   GEST. AGE      CONC.  (mIU/mL)   <=1 WEEK  5 - 50     2 WEEKS       50 - 500     3 WEEKS       100 - 10,000     4 WEEKS     1,000 - 30,000        FEMALE AND NON-PREGNANT FEMALE:     LESS THAN 5 mIU/mL   CBG monitoring, ED     Status: Abnormal   Collection Time: 07/19/19  5:00 AM  Result Value Ref Range   Glucose-Capillary 169 (H) 70 - 99 mg/dL  Glucose, capillary     Status: Abnormal   Collection Time: 07/19/19  7:43 AM  Result Value Ref Range   Glucose-Capillary 165 (H) 70 - 99 mg/dL  CBC WITH DIFFERENTIAL     Status: Abnormal   Collection Time: 07/19/19  8:30 AM  Result Value Ref Range   WBC 15.1 (H) 4.0 - 10.5 K/uL   RBC 3.99 3.87 - 5.11 MIL/uL   Hemoglobin 11.1 (L) 12.0 - 15.0 g/dL   HCT 33.9 (L) 36.0 - 46.0 %   MCV 85.0 80.0 - 100.0 fL   MCH 27.8 26.0 - 34.0 pg   MCHC 32.7 30.0 - 36.0 g/dL   RDW 16.7 (H) 11.5 - 15.5 %    Platelets 482 (H) 150 - 400 K/uL   nRBC 0.0 0.0 - 0.2 %   Neutrophils Relative % 77 %   Neutro Abs 11.7 (H) 1.7 - 7.7 K/uL   Lymphocytes Relative 11 %   Lymphs Abs 1.6 0.7 - 4.0 K/uL   Monocytes Relative 8 %   Monocytes Absolute 1.2 (H) 0.1 - 1.0 K/uL   Eosinophils Relative 2 %   Eosinophils Absolute 0.3 0.0 - 0.5 K/uL   Basophils Relative 1 %   Basophils Absolute 0.2 (H) 0.0 - 0.1 K/uL   Immature Granulocytes 1 %   Abs Immature Granulocytes 0.12 (H) 0.00 - 0.07 K/uL    Comment: Performed at [facility], [place] [address], [place], [place] [address]    Imaging / Studies: CT Abdomen Pelvis W Contrast  Result Date: 06/29/2019 CLINICAL DATA:  Right-sided abdominal pain and recent C difficile infection EXAM: CT ABDOMEN AND PELVIS WITH CONTRAST TECHNIQUE: Multidetector CT imaging of the abdomen and pelvis was performed using the standard protocol following bolus administration of intravenous contrast. CONTRAST:  114mL OMNIPAQUE IOHEXOL 300 MG/ML  SOLN COMPARISON:  05/25/2013 FINDINGS: Lower chest: Mild scarring is noted in the left lung base. Stable nodule in the left base unchanged from the prior exam. Hepatobiliary: Mild fatty infiltration of the liver is noted. The gallbladder is well distended. Few small gallstones are noted within the gallbladder stable from the previous exam. Pancreas: Unremarkable. No pancreatic ductal dilatation or surrounding inflammatory changes. Spleen: Normal in size without focal abnormality. Adrenals/Urinary Tract: Adrenal glands are within normal limits bilaterally. Scattered cysts are seen within the kidneys bilaterally. Normal excretion of contrast is noted. No obstructive changes are seen. The bladder is well distended. Stomach/Bowel: Appendix is well visualized within normal limits. Scattered minimal diverticular change of the colon is noted without evidence of diverticulitis. No small bowel abnormality is seen. The stomach is decompressed.  Vascular/Lymphatic: Aortic atherosclerosis. No enlarged abdominal or pelvic lymph nodes. Reproductive: Calcified uterine fibroid is noted stable from the prior exam. Uterus is otherwise within normal limits. No adnexal mass is seen. Other: No free fluid is noted.  No herniation is seen. Musculoskeletal: Degenerative changes of the lumbar spine are  seen. Mild degenerative anterolisthesis of L4 on L5 is noted. This is stable from the prior exam. IMPRESSION: Stable cholelithiasis without complicating factors. Calcified uterine fibroids. Fatty infiltration of the liver. No acute abnormality noted. Electronically Signed   By: Inez Catalina M.D.   On: 06/29/2019 10:28   US Abdomen Limited RUQ  Result Date: 07/19/2019 CLINICAL DATA:  Right upper quadrant pain. Gallstones on recent CT. Elevated white blood cell count. EXAM: ULTRASOUND ABDOMEN LIMITED RIGHT UPPER QUADRANT COMPARISON:  Abdominal CT 06/29/2019 FINDINGS: Gallbladder: Distended with multiple intraluminal gallstones. Mild gallbladder wall thickening of 3-4 mm. No pericholecystic fluid. Positive sonographic Murphy sign noted by sonographer. Common bile duct: Diameter: 3 mm, normal. Liver: Heterogeneously increased in parenchymal echogenicity. The liver parenchyma is difficult to penetrate. No evidence of focal lesion. Portal vein is patent on color Doppler imaging with normal direction of blood flow towards the liver. Other: None. IMPRESSION: 1. Gallstones with mild gallbladder wall thickening and positive sonographic Murphy sign. Findings are consistent with acute cholecystitis in the appropriate clinical setting. 2. No biliary dilatation. 3. Hepatic steatosis. Electronically Signed   By: Keith Rake M.D.   On: 07/19/2019 00:50    Medications / Allergies: per chart  Antibiotics: Anti-infectives (From admission, onward)   Start     Dose/Rate Route Frequency Ordered Stop   07/19/19 0415  meropenem (MERREM) 1 g in sodium chloride 0.9 % 100 mL IVPB       1 g 200 mL/hr over 30 Minutes Intravenous Every 8 hours 07/19/19 0409     07/19/19 0200  ciprofloxacin (CIPRO) IVPB 400 mg     400 mg 200 mL/hr over 60 Minutes Intravenous  Once 07/19/19 0146 07/19/19 0447        Note: Portions of this report may have been transcribed using voice recognition software. Every effort was made to ensure accuracy; however, inadvertent computerized transcription errors may be present.   Any transcriptional errors that result from this process are unintentional.    Adin Hector, MD, FACS, MASCRS Gastrointestinal and Minimally Invasive Surgery  [facility] [address] [place], [place] [address] [phone] Main / Paging [phone] Fax     07/19/2019

## 2019-07-19 NOTE — H&P (Signed)
History and Physical    Shannon Garrett [id number] DOB: 13-Sep-1960 DOA: 07/18/2019  PCP: Gildardo Cranker, DO   Patient coming from: SNF   Chief Complaint: Abdominal pain, nausea   HPI: Shannon Garrett is a 59 y.o. female with medical history significant for hypertension, depression, multiple sclerosis, and chronic diastolic CHF, now presenting to the emergency department for evaluation of right-sided abdominal pain, nausea, and anorexia.  Patient reports that the current symptoms began on 07/17/2019, she has not been vomiting but has had a loss of appetite.  Symptom onset was shortly after eating dinner.  Pain is mainly in the right upper quadrant.  She felt as though she may be febrile earlier.  Denies shaking chills.  She was admitted in November 2020 with COVID-19 and sepsis secondary to ESBL UTI.  She completed a course of meropenem and remdesivir, was tapered off of steroids, and return to SNF.  Since then, she reports developing C. difficile colitis that was treated with oral vancomycin and has resolved.  She had a normal bowel movement yesterday.  ED Course: Upon arrival to the ED, patient is found to be afebrile, saturating well on room air, tachycardic in the 120s, and with stable blood pressure.  Chemistry panel is notable for creatinine 1.16, similar to priors.  CBC features a leukocytosis to 13,700 and thrombocytosis.  Patient was given 500 cc normal saline, fentanyl, Zofran, and ciprofloxacin.  Hospitalist were asked to admit.  Review of Systems:  All other systems reviewed and apart from HPI, are negative.  Past Medical History:  Diagnosis Date  . Cardiomyopathy ([place])    a.  Echo 04/29/12: Mild LVH, EF 20-25%, mild AI, moderate MR, moderate LAE, mild RAE, mild RVE, moderate TR, PASP 51, small pericardial effusion;   b. probably non-ischemic given multiple chemo-Tx agents used for MS and global LV dysfn on echo  . Chronic systolic heart failure ([place])   . COVID-19 05/10/2019   Per  facility paperwork  . Depression   . Glaucoma   . Hypertension   . MS (multiple sclerosis) ([place])    a. Dx'd late 20's. b. Tx with Novantrone, Tysabri, Copaxone previously.    Past Surgical History:  Procedure Laterality Date  . ABLATION     uterine  . CESAREAN SECTION       reports that she has quit smoking. She has a 2.50 pack-year smoking history. She has never used smokeless tobacco. She reports that she does not drink alcohol or use drugs.  Allergies  Allergen Reactions  . Sulfonamide Derivatives Hives and Shortness Of Breath  . Penicillins Hives and Itching    Has patient had a PCN reaction causing immediate rash, facial/tongue/throat swelling, SOB or lightheadedness with hypotension: unknown Has patient had a PCN reaction causing severe rash involving mucus membranes or skin necrosis: unknown Has patient had a PCN reaction that required hospitalization: unknown Has patient had a PCN reaction occurring within the last 10 years: unknown If all of the above answers are "NO", then may proceed with Cephalosporin use. Prior course of rocephin charted 05/2015    Family History  Problem Relation Age of Onset  . Hypertension Mother   . Cancer Mother        breast   . Cancer Father        prostate  . Multiple sclerosis Sister   . Heart attack Neg Hx      Prior to Admission medications   Medication Sig Start Date End Date Taking? Authorizing Provider  acetaminophen (TYLENOL) 325 MG tablet Take 650 mg by mouth 3 (three) times daily.    [provider]  albuterol (PROVENTIL) (2.5 MG/3ML) 0.083% nebulizer solution Take 3 mLs (2.5 mg total) by nebulization every 2 (two) hours as needed for wheezing. 11/10/15   Regalado, Belkys A, MD  amantadine (SYMMETREL) 100 MG capsule Take 100 mg by mouth daily.     [provider]  ascorbic acid (VITAMIN C) 500 MG tablet Take 500 mg by mouth 2 (two) times daily.    [provider]  b complex vitamins tablet Take 1  tablet by mouth daily.    [provider]  Baclofen 5 MG TABS Take 5 mg by mouth 4 (four) times daily. Hold if lethargic or confused  12/04/17   [provider]  buPROPion (WELLBUTRIN XL) 150 MG 24 hr tablet Take 150 mg by mouth daily after breakfast.    [provider]  carvedilol (COREG) 25 MG tablet Take 1 tablet (25 mg total) by mouth 2 (two) times daily with a meal. 07/25/15   Rai, Ripudeep K, MD  Cholecalciferol (VITAMIN D-3 PO) Take 1,000 Units by mouth at bedtime.     [provider]  CRANBERRY PO Take 100 mg by mouth at bedtime.     [provider]  DULOXETINE HCL PO Take 90 mg by mouth daily.  03/07/18   [provider]  famotidine (PEPCID) 20 MG tablet Take 1 tablet (20 mg total) by mouth 2 (two) times daily. 07/25/15   Rai, Vernelle Emerald, MD  ferrous sulfate 325 (65 FE) MG tablet Take 325 mg by mouth daily with breakfast. 02/20/18   [provider]  fluticasone (FLONASE) 50 MCG/ACT nasal spray Place 2 sprays into both nostrils every evening.    [provider]  gabapentin (NEURONTIN) 800 MG tablet Take 800 mg by mouth 3 (three) times daily.    [provider]  insulin aspart (NOVOLOG FLEXPEN) 100 UNIT/ML FlexPen 0-15 Units, Subcutaneous, 3 times daily with meals CBG < 70: implement hypoglycemia protocol-call MD CBG 70 - 120: 0 units CBG 121 - 150: 2 units CBG 151 - 200: 3 units CBG 201 - 250: 5 units CBG 251 - 300: 8 units CBG 301 - 350: 11 units CBG 351 - 400: 15 units CBG > 400: 05/18/19   Ghimire, Henreitta Leber, MD  insulin glargine (LANTUS) 100 UNIT/ML injection Inject 0.1 mLs (10 Units total) into the skin daily. 05/19/19   Ghimire, Henreitta Leber, MD  Lactase (LACTAID PO) Take 1 tablet by mouth at bedtime.     [provider]  Lactobacillus (PROBIOTIC ACIDOPHILUS) CAPS Take 1 capsule by mouth every other day.    [provider]  latanoprost (XALATAN) 0.005 % ophthalmic solution Place 1 drop into  both eyes at bedtime.    [provider]  linaclotide Rolan Lipa) 290 MCG CAPS capsule Take 290 mcg by mouth daily before breakfast.  11/20/17   [provider]  Melatonin 5 MG TABS Take 10 mg by mouth at bedtime.  11/14/17   [provider]  Multiple Vitamins-Minerals (MULTIVITAMIN ADULT PO) Take 1 tablet by mouth daily.    [provider]  omeprazole (PRILOSEC) 20 MG capsule Take 20 mg by mouth daily.    [provider]  ondansetron (ZOFRAN) 4 MG tablet Take 4 mg by mouth every 6 (six) hours as needed for nausea or vomiting. 11/01/17   [provider]  potassium chloride (KLOR-CON) 20 MEQ packet Take  20 mEq by mouth daily.     [provider]  predniSONE (DELTASONE) 10 MG tablet Take 40 mg daily for 1 day, 30 mg daily for 1 day, 20 mg daily for 1 days,10 mg daily for 1 day, then stop 05/18/19   Ghimire, Henreitta Leber, MD  Quercetin 250 MG TABS Take 250 mg by mouth 2 (two) times daily.    [provider]  senna-docusate (SENOKOT S) 8.6-50 MG tablet Take 2 tablets by mouth 2 (two) times daily.     [provider]  spironolactone (ALDACTONE) 25 MG tablet Take 25 mg by mouth daily. For CHF    [provider]  tamsulosin (FLOMAX) 0.4 MG CAPS capsule Take 0.4 mg by mouth daily. For incontinence    [provider]  torsemide (DEMADEX) 20 MG tablet Take 40 mg by mouth daily.     [provider]  traMADol (ULTRAM) 50 MG tablet Take 1 tablet (50 mg total) by mouth every 8 (eight) hours as needed for moderate pain or severe pain. 05/18/19   Ghimire, Henreitta Leber, MD  vancomycin (VANCOCIN) 125 MG capsule Take 125 mg by mouth 4 (four) times daily.    [provider]  zinc sulfate 220 (50 Zn) MG capsule Take 220 mg by mouth daily.    [provider]    Physical Exam: Vitals:   07/18/19 2007 07/19/19 0223  BP: 116/72 127/74  Pulse: (!) 113 (!) 126  Resp: 20 (!) 22  Temp: 99 F (37.2 C)     TempSrc: Oral   SpO2: 96% 98%    Constitutional: NAD, calm  Eyes: PERTLA, lids and conjunctivae normal ENMT: Mucous membranes are moist. Posterior pharynx clear of any exudate or lesions.   Neck: normal, supple, no masses, no thyromegaly Respiratory: no wheezing, no crackles. Normal respiratory effort. No accessory muscle use.  Cardiovascular: Rate ~120 and regular. No extremity edema.  Abdomen: tender in RUQ, no rebound pain or guarding. Bowel sounds active.  Musculoskeletal: no clubbing / cyanosis. No joint deformity upper and lower extremities.   Skin: no significant rashes, lesions, ulcers. Warm, dry, well-perfused. Neurologic: Mild dysarthria. No aphasia. Hearing grossly normal. Sensation intact. Paraplegic.  Psychiatric: Alert and answering questions appropriately. Pleasant, cooperative.     Labs on Admission: I have personally reviewed following labs and imaging studies  CBC: Recent Labs  Lab 07/18/19 2151  WBC 13.7*  HGB 11.4*  HCT 35.3*  MCV 85.7  PLT [id number]*   Basic Metabolic Panel: Recent Labs  Lab 07/18/19 2151  NA 139  K 3.7  CL 100  CO2 28  GLUCOSE 163*  BUN 14  CREATININE 1.16*  CALCIUM 9.2   GFR: CrCl cannot be calculated (Unknown ideal weight.). Liver Function Tests: Recent Labs  Lab 07/18/19 2151  AST 25  ALT 41  ALKPHOS 433*  BILITOT 0.9  PROT 7.7  ALBUMIN 3.5   Recent Labs  Lab 07/18/19 2151  LIPASE 14   No results for input(s): AMMONIA in the last 168 hours. Coagulation Profile: No results for input(s): INR, PROTIME in the last 168 hours. Cardiac Enzymes: No results for input(s): CKTOTAL, CKMB, CKMBINDEX, TROPONINI in the last 168 hours. BNP (last 3 results) No results for input(s): PROBNP in the last 8760 hours. HbA1C: No results for input(s): HGBA1C in the last 72 hours. CBG: No results for input(s): GLUCAP in the last 168 hours. Lipid Profile: No results for input(s): CHOL, HDL, LDLCALC, TRIG, CHOLHDL, LDLDIRECT in the  last 72  hours. Thyroid Function Tests: No results for input(s): TSH, T4TOTAL, FREET4, T3FREE, THYROIDAB in the last 72 hours. Anemia Panel: No results for input(s): VITAMINB12, FOLATE, FERRITIN, TIBC, IRON, RETICCTPCT in the last 72 hours. Urine analysis:    Component Value Date/Time   COLORURINE YELLOW 05/10/2019 2350   APPEARANCEUR HAZY (A) 05/10/2019 2350   LABSPEC 1.006 05/10/2019 2350   PHURINE 5.0 05/10/2019 2350   GLUCOSEU NEGATIVE 05/10/2019 2350   HGBUR MODERATE (A) 05/10/2019 2350   BILIRUBINUR NEGATIVE 05/10/2019 2350   KETONESUR NEGATIVE 05/10/2019 2350   PROTEINUR NEGATIVE 05/10/2019 2350   UROBILINOGEN 1.0 01/10/2015 2311   NITRITE NEGATIVE 05/10/2019 2350   LEUKOCYTESUR LARGE (A) 05/10/2019 2350   Sepsis Labs: @LABRCNTIP (procalcitonin:4,lacticidven:4) )No results found for this or any previous visit (from the past 240 hour(s)).   Radiological Exams on Admission: US Abdomen Limited RUQ  Result Date: 07/19/2019 CLINICAL DATA:  Right upper quadrant pain. Gallstones on recent CT. Elevated white blood cell count. EXAM: ULTRASOUND ABDOMEN LIMITED RIGHT UPPER QUADRANT COMPARISON:  Abdominal CT 06/29/2019 FINDINGS: Gallbladder: Distended with multiple intraluminal gallstones. Mild gallbladder wall thickening of 3-4 mm. No pericholecystic fluid. Positive sonographic Murphy sign noted by sonographer. Common bile duct: Diameter: 3 mm, normal. Liver: Heterogeneously increased in parenchymal echogenicity. The liver parenchyma is difficult to penetrate. No evidence of focal lesion. Portal vein is patent on color Doppler imaging with normal direction of blood flow towards the liver. Other: None. IMPRESSION: 1. Gallstones with mild gallbladder wall thickening and positive sonographic Murphy sign. Findings are consistent with acute cholecystitis in the appropriate clinical setting. 2. No biliary dilatation. 3. Hepatic steatosis. Electronically Signed   By: Keith Rake M.D.   On:  07/19/2019 00:50    Assessment/Plan   1. Acute calculous cholecystitis  - Presents with 1-2 days of right-sided abdominal pain, nausea without vomiting, anorexia, and subjective fever, and is found to have leukocytosis and RUQ US findings concerning for acute calculous cholecystitis  - She was treated with IVF, analgesia, and Cipro in ED  - She had sepsis secondary to ESBL in November; will change coverage to meropenem for now, continue pain-control, bowel-rest, supportive care    2. Insulin-dependent DM  - A1c was 7.6% in November 2020  - Continue CBG checks, insulin   3. Advanced MS  - She is nonambulatory at baseline, follows with neurology  - Continue supportive care   4. Chronic diastolic CHF  - Appears compensated  - Hold diuretics, continue gentle IVF hydration while NPO     DVT prophylaxis: SCD's  Code Status: DNR, confirmed with patient  Family Communication: Discussed with patient  Consults called: None  Admission status: Inpatient     Vianne Bulls, MD [facility] Pager [phone]  If 7PM-7AM, please contact night-coverage [email or web address] Password [email or web address]  07/19/2019, 4:10 AM

## 2019-07-19 NOTE — Progress Notes (Addendum)
   07/19/19 0745  MEWS Score  Temp 99 F (37.2 C)  BP 128/80  Pulse Rate (!) 140  Resp (!) 21  SpO2 97 %  MEWS Score  MEWS Temp 0  MEWS Systolic 0  MEWS Pulse 3  MEWS RR 1  MEWS LOC 0  MEWS Score 4  MEWS Score Color Red  MEWS Assessment  Is this an acute change? Yes  MEWS guidelines implemented *See Row Information* Red  Rapid Response Notification  Name of Rapid Response RN Notified Leonie Man, RN  Date Rapid Response Notified 07/19/19  Time Rapid Response Notified [id number]  Provider Notification  Provider Name/Title MD Aileen Fass  Date Provider Notified 07/19/19  Time Provider Notified [id number]  Notification Type Page  Notification Reason Change in status  Response See new orders  Date of Provider Response 07/19/19  Time of Provider Response 0836   Pt admitted to floor from ED. Red MEWS upon arrival. MD Aileen Fass and Sarah with RR made aware. New orders received from MD for EKG, completed and placed in chart. Pt alert, asking for pain medication. MEWS protocol initiated. Will continue to monitor patient.

## 2019-07-19 NOTE — Progress Notes (Signed)
Pt heart rate remaining elevated in 120's. Per MD, additional 5mg  Lopressor IV and 25mg  Lopressor PO given to pt, with orders to obtain additional EKG one hour post. Will continue to monitor patient.

## 2019-07-19 NOTE — Progress Notes (Signed)
Pt with minimal urine output. Bladder scanned, 263mL shown. MD Aileen Fass made aware. No new orders. Will continue to monitor patient.

## 2019-07-19 NOTE — ED Notes (Signed)
ED TO INPATIENT HANDOFF REPORT  Name/Age/Gender Shannon Garrett 59 y.o. female  Code Status    Code Status Orders  (From admission, onward)         Start     Ordered   07/19/19 0401  Do not attempt resuscitation (DNR)  Continuous    Question Answer Comment  In the event of cardiac or respiratory ARREST Do not call a "code blue"   In the event of cardiac or respiratory ARREST Do not perform Intubation, CPR, defibrillation or ACLS   In the event of cardiac or respiratory ARREST Use medication by any route, position, wound care, and other measures to relive pain and suffering. May use oxygen, suction and manual treatment of airway obstruction as needed for comfort.      07/19/19 0400        Code Status History    Date Active Date Inactive Code Status Order ID Comments User Context   05/10/2019 1940 05/18/2019 1732 DNR [id number]  Verlee Monte, MD ED   05/10/2019 1431 05/10/2019 1940 DNR [id number]  Lorin Glass, PA-C ED   11/07/2015 2300 11/10/2015 1615 DNR [id number]  Norval Morton, MD ED   07/21/2015 1338 07/25/2015 2137 DNR [id number]  Rush Farmer, MD Inpatient   07/10/2015 2024 07/21/2015 1338 Full Code [id number]  Juanito Doom, MD Inpatient   07/04/2015 1647 07/08/2015 1403 Full Code [id number]  Louellen Molder, MD Inpatient   06/08/2015 2210 06/12/2015 1610 Full Code [id number]  Theressa Millard, MD Inpatient   05/31/2015 0334 06/04/2015 1731 Full Code [id number]  Reubin Milan, MD Inpatient   01/11/2015 0242 01/17/2015 1846 Full Code [id number]  Allyne Gee, MD Inpatient   02/23/2014 2232 01/11/2015 0242 DNR [id number]  Hennie Duos, MD Outpatient   02/20/2014 0358 02/22/2014 1937 DNR [id number]  Toy Baker, MD ED   11/04/2013 1353 02/20/2014 0358 Full Code [id number]  Hennie Duos, MD Outpatient   07/31/2013 1932 08/02/2013 1650 Full Code [id number]  Allie Bossier, MD Inpatient   05/25/2013 0200 05/26/2013 1653 Full Code [id number]  Rise Patience, MD ED    09/10/2012 0211 09/11/2012 1806 Full Code [id number]  Samuella Cota, MD Inpatient   09/04/2012 1605 09/08/2012 2046 Full Code [id number]  Nita Sells, MD Inpatient   04/28/2012 2308 05/01/2012 1928 Full Code [id number]  Domingo Dimes, RN Inpatient   Advance Care Planning Activity      Home/SNF/Other Home  Chief Complaint Acute calculous cholecystitis [K80.00]  Level of Care/Admitting Diagnosis ED Disposition    ED Disposition Condition [place] Area: [facility] [[id number]]  Level of Care: Telemetry [5]  Admit to tele based on following criteria: Complex arrhythmia (Bradycardia/Tachycardia)  Covid Evaluation: Asymptomatic Screening Protocol (No Symptoms)  Diagnosis: Acute calculous cholecystitis [id number]  Admitting Physician: Vianne Bulls [id number]  Attending Physician: Vianne Bulls [id number]  Estimated length of stay: past midnight tomorrow  Certification:: I certify this patient will need inpatient services for at least 2 midnights       Medical History Past Medical History:  Diagnosis Date  . Cardiomyopathy ([place])    a.  Echo 04/29/12: Mild LVH, EF 20-25%, mild AI, moderate MR, moderate LAE, mild RAE, mild RVE, moderate TR, PASP 51, small pericardial effusion;   b. probably non-ischemic given multiple chemo-Tx agents used for MS and global LV dysfn on echo  . Chronic systolic heart failure ([place])   . COVID-19 05/10/2019   Per facility  paperwork  . Depression   . Glaucoma   . Hypertension   . MS (multiple sclerosis) ([place])    a. Dx'd late 20's. b. Tx with Novantrone, Tysabri, Copaxone previously.    Allergies Allergies  Allergen Reactions  . Sulfonamide Derivatives Hives and Shortness Of Breath  . Penicillins Hives and Itching    Has patient had a PCN reaction causing immediate rash, facial/tongue/throat swelling, SOB or lightheadedness with hypotension: unknown Has patient had a PCN reaction causing severe rash  involving mucus membranes or skin necrosis: unknown Has patient had a PCN reaction that required hospitalization: unknown Has patient had a PCN reaction occurring within the last 10 years: unknown If all of the above answers are "NO", then may proceed with Cephalosporin use. Prior course of rocephin charted 05/2015    IV Location/Drains/Wounds Patient Lines/Drains/Airways Status   Active Line/Drains/Airways    Name:   Placement date:   Placement time:   Site:   Days:   Peripheral IV 07/19/19 Right Forearm   07/19/19    0304    Forearm   less than 1   Pressure Injury 05/11/19 Buttocks Left;Posterior Stage II -  Partial thickness loss of dermis presenting as a shallow open ulcer with a red, pink wound bed without slough. stage two in the crease of left bottucks   05/11/19    1015     69          Labs/Imaging Results for orders placed or performed during the hospital encounter of 07/18/19 (from the past 48 hour(s))  Lipase, blood     Status: None   Collection Time: 07/18/19  9:51 PM  Result Value Ref Range   Lipase 14 11 - 51 U/L    Comment: Performed at [facility], [place] [address] [place], [place] [address]  Comprehensive metabolic panel     Status: Abnormal   Collection Time: 07/18/19  9:51 PM  Result Value Ref Range   Sodium 139 135 - 145 mmol/L   Potassium 3.7 3.5 - 5.1 mmol/L   Chloride 100 98 - 111 mmol/L   CO2 28 22 - 32 mmol/L   Glucose, Bld 163 (H) 70 - 99 mg/dL   BUN 14 6 - 20 mg/dL   Creatinine, Ser 1.16 (H) 0.44 - 1.00 mg/dL   Calcium 9.2 8.9 - 10.3 mg/dL   Total Protein 7.7 6.5 - 8.1 g/dL   Albumin 3.5 3.5 - 5.0 g/dL   AST 25 15 - 41 U/L   ALT 41 0 - 44 U/L   Alkaline Phosphatase 433 (H) 38 - 126 U/L   Total Bilirubin 0.9 0.3 - 1.2 mg/dL   GFR calc non Af Amer 53 (L) >60 mL/min   GFR calc Af Amer >60 >60 mL/min   Anion gap 11 5 - 15    Comment: Performed at [facility], [place] [address], [place], [place] [address]  CBC      Status: Abnormal   Collection Time: 07/18/19  9:51 PM  Result Value Ref Range   WBC 13.7 (H) 4.0 - 10.5 K/uL   RBC 4.12 3.87 - 5.11 MIL/uL   Hemoglobin 11.4 (L) 12.0 - 15.0 g/dL   HCT 35.3 (L) 36.0 - 46.0 %   MCV 85.7 80.0 - 100.0 fL   MCH 27.7 26.0 - 34.0 pg   MCHC 32.3 30.0 - 36.0 g/dL   RDW 16.7 (H) 11.5 - 15.5 %   Platelets 471 (H) 150 - 400 K/uL  nRBC 0.0 0.0 - 0.2 %    Comment: Performed at [facility], [place] [address], [place], [place] [address]  I-Stat beta hCG blood, ED     Status: None   Collection Time: 07/18/19 10:07 PM  Result Value Ref Range   I-stat hCG, quantitative <5.0 <5 mIU/mL   Comment 3            Comment:   GEST. AGE      CONC.  (mIU/mL)   <=1 WEEK        5 - 50     2 WEEKS       50 - 500     3 WEEKS       100 - 10,000     4 WEEKS     1,000 - 30,000        FEMALE AND NON-PREGNANT FEMALE:     LESS THAN 5 mIU/mL   CBG monitoring, ED     Status: Abnormal   Collection Time: 07/19/19  5:00 AM  Result Value Ref Range   Glucose-Capillary 169 (H) 70 - 99 mg/dL   US Abdomen Limited RUQ  Result Date: 07/19/2019 CLINICAL DATA:  Right upper quadrant pain. Gallstones on recent CT. Elevated white blood cell count. EXAM: ULTRASOUND ABDOMEN LIMITED RIGHT UPPER QUADRANT COMPARISON:  Abdominal CT 06/29/2019 FINDINGS: Gallbladder: Distended with multiple intraluminal gallstones. Mild gallbladder wall thickening of 3-4 mm. No pericholecystic fluid. Positive sonographic Murphy sign noted by sonographer. Common bile duct: Diameter: 3 mm, normal. Liver: Heterogeneously increased in parenchymal echogenicity. The liver parenchyma is difficult to penetrate. No evidence of focal lesion. Portal vein is patent on color Doppler imaging with normal direction of blood flow towards the liver. Other: None. IMPRESSION: 1. Gallstones with mild gallbladder wall thickening and positive sonographic Murphy sign. Findings are consistent with acute cholecystitis in the appropriate  clinical setting. 2. No biliary dilatation. 3. Hepatic steatosis. Electronically Signed   By: Keith Rake M.D.   On: 07/19/2019 00:50    Pending Labs Unresulted Labs (From admission, onward)    Start     Ordered   07/19/19 0500  CBC WITH DIFFERENTIAL  Tomorrow morning,   R     07/19/19 0400   07/18/19 2016  Urinalysis, Routine w reflex microscopic  ONCE - STAT,   STAT     07/18/19 2015          Vitals/Pain Today's Vitals   07/18/19 2007 07/18/19 2013 07/19/19 0223 07/19/19 0530  BP: 116/72  127/74 (!) 122/96  Pulse: (!) 113  (!) 126 (!) 132  Resp: 20  (!) 22 20  Temp: 99 F (37.2 C)     TempSrc: Oral     SpO2: 96%  98% 98%  PainSc:  8       Isolation Precautions No active isolations  Medications Medications  insulin aspart (novoLOG) injection 0-9 Units (0 Units Subcutaneous Not Given 07/19/19 0501)  sodium chloride flush (NS) 0.9 % injection 3 mL (has no administration in time range)  acetaminophen (TYLENOL) tablet 650 mg (has no administration in time range)    Or  acetaminophen (TYLENOL) suppository 650 mg (has no administration in time range)  ondansetron (ZOFRAN) tablet 4 mg (has no administration in time range)    Or  ondansetron (ZOFRAN) injection 4 mg (has no administration in time range)  fentaNYL (SUBLIMAZE) injection 25-50 mcg (50 mcg Intravenous Given 07/19/19 0455)  0.9 %  sodium chloride infusion (has no administration in time  range)  meropenem (MERREM) 1 g in sodium chloride 0.9 % 100 mL IVPB ( Intravenous Stopped 07/19/19 0533)  sodium chloride flush (NS) 0.9 % injection 3 mL (3 mLs Intravenous Given by Other 07/19/19 0401)  sodium chloride 0.9 % bolus 500 mL (0 mLs Intravenous Stopped 07/19/19 0353)  fentaNYL (SUBLIMAZE) injection 50 mcg (50 mcg Intravenous Given 07/19/19 0224)  ondansetron (ZOFRAN) injection 4 mg (4 mg Intravenous Given 07/19/19 0306)  ciprofloxacin (CIPRO) IVPB 400 mg (0 mg Intravenous Stopped 07/19/19 0447)    Mobility walks

## 2019-07-19 NOTE — Progress Notes (Signed)
[facility] PROGRESS NOTE    Progress Note  Shannon Garrett  LGX:[id number] DOB: [birth year] DOA: 07/18/2019 PCP: Gildardo Cranker, DO     Brief Narrative:   Shannon Garrett is an 59 y.o. female past medical history significant for hypertension multiple sclerosis, chronic diastolic heart failure presents to the emergency room right-sided abdominal pain nausea and anorexia she was recently admitted on November 2020 with COVID-19 and sepsis secondary to ESBL UTI for which she completed her treatment subsequently she developed C. difficile colitis for which she completed her treatment with oral vancomycin, she denies any diarrhea right upper quadrant ultrasound showed acute calculus cholecystitis she was started empirically on treatment.  Assessment/Plan:   Sepsis due to acute calculous cholecystitis: Abdominal ultrasound done on 07/18/2018 showed gallstone with mild gallbladder wall thickening and positive Murphy sign sonographically.  No bile duct dilation. She was started on IV fluids analgesics and meropenem. Placed n.p.o.  We will consult surgery.  Insulin-dependent diabetes mellitus type 2: With an A1c of 7.6 in November 2020 She is currently n.p.o., continue sliding scale insulin.  Advanced MS: Nonambulatory at baseline continue supportive care. Consult physical therapy.  Chronic diastolic heart failure: As she is currently n.p.o. her blood pressure seems to be stable and she is in mild acute kidney injury diuretics were held.  She was started on gentle IV fluid hydration for the next 12 hours.  Acute kidney injury: Likely prerenal azotemia in the setting of sepsis.   Start on gentle IV fluid hydration, with a baseline creatinine of 0.8, recheck a basic metabolic panel in the morning.  Stage II sacral decubitus ulcer present on admission: Consult wound care. RN Pressure Injury Documentation: Pressure Injury 05/11/19 Buttocks Left;Posterior Stage II -  Partial thickness loss of  dermis presenting as a shallow open ulcer with a red, pink wound bed without slough. stage two in the crease of left bottucks (Active)  05/11/19 1015  Location: Buttocks  Location Orientation: Left;Posterior  Staging: Stage II -  Partial thickness loss of dermis presenting as a shallow open ulcer with a red, pink wound bed without slough.  Wound Description (Comments): stage two in the crease of left bottucks  Present on Admission: Yes    Estimated body mass index is 44.02 kg/m as calculated from the following:   Height as of 05/10/19: '5\' 5"'$  (1.651 m).   Weight as of 05/10/19: 120 kg.   DVT prophylaxis: Lovenox Family Communication: None Disposition Plan/Barrier to D/C: When she has lap chole. Where the patient is from  Anticipated d/c place.  Barriers to d/c OR conditions which need to be met to effect a safe d/c.  Code Status:     Code Status Orders  (From admission, onward)         Start     Ordered   07/19/19 0401  Do not attempt resuscitation (DNR)  Continuous    Question Answer Comment  In the event of cardiac or respiratory ARREST Do not call a "code blue"   In the event of cardiac or respiratory ARREST Do not perform Intubation, CPR, defibrillation or ACLS   In the event of cardiac or respiratory ARREST Use medication by any route, position, wound care, and other measures to relive pain and suffering. May use oxygen, suction and manual treatment of airway obstruction as needed for comfort.      07/19/19 0400        Code Status History    Date Active Date Inactive Code Status Order ID  Comments User Context   05/10/2019 1940 05/18/2019 1732 DNR [id number]  Verlee Monte, MD ED   05/10/2019 1431 05/10/2019 1940 DNR [id number]  Lorin Glass, PA-C ED   11/07/2015 2300 11/10/2015 1615 DNR [id number]  Norval Morton, MD ED   07/21/2015 1338 07/25/2015 2137 DNR [id number]  Rush Farmer, MD Inpatient   07/10/2015 2024 07/21/2015 1338 Full Code [id number]  Juanito Doom,  MD Inpatient   07/04/2015 1647 07/08/2015 1403 Full Code [id number]  Louellen Molder, MD Inpatient   06/08/2015 2210 06/12/2015 1610 Full Code [id number]  Theressa Millard, MD Inpatient   05/31/2015 0334 06/04/2015 1731 Full Code [id number]  Reubin Milan, MD Inpatient   01/11/2015 0242 01/17/2015 1846 Full Code [id number]  Allyne Gee, MD Inpatient   02/23/2014 2232 01/11/2015 0242 DNR [id number]  Hennie Duos, MD Outpatient   02/20/2014 0358 02/22/2014 1937 DNR [id number]  Toy Baker, MD ED   11/04/2013 1353 02/20/2014 0358 Full Code [id number]  Hennie Duos, MD Outpatient   07/31/2013 1932 08/02/2013 1650 Full Code [id number]  Allie Bossier, MD Inpatient   05/25/2013 0200 05/26/2013 1653 Full Code [id number]  Rise Patience, MD ED   09/10/2012 0211 09/11/2012 1806 Full Code [id number]  Samuella Cota, MD Inpatient   09/04/2012 1605 09/08/2012 2046 Full Code [id number]  Nita Sells, MD Inpatient   04/28/2012 2308 05/01/2012 1928 Full Code [id number]  Domingo Dimes, RN Inpatient   Advance Care Planning Activity        IV Access:    Peripheral IV   Procedures and diagnostic studies:   US Abdomen Limited RUQ  Result Date: 07/19/2019 CLINICAL DATA:  Right upper quadrant pain. Gallstones on recent CT. Elevated white blood cell count. EXAM: ULTRASOUND ABDOMEN LIMITED RIGHT UPPER QUADRANT COMPARISON:  Abdominal CT 06/29/2019 FINDINGS: Gallbladder: Distended with multiple intraluminal gallstones. Mild gallbladder wall thickening of 3-4 mm. No pericholecystic fluid. Positive sonographic Murphy sign noted by sonographer. Common bile duct: Diameter: 3 mm, normal. Liver: Heterogeneously increased in parenchymal echogenicity. The liver parenchyma is difficult to penetrate. No evidence of focal lesion. Portal vein is patent on color Doppler imaging with normal direction of blood flow towards the liver. Other: None. IMPRESSION: 1. Gallstones with mild gallbladder wall thickening  and positive sonographic Murphy sign. Findings are consistent with acute cholecystitis in the appropriate clinical setting. 2. No biliary dilatation. 3. Hepatic steatosis. Electronically Signed   By: Keith Rake M.D.   On: 07/19/2019 00:50     Medical Consultants:    None.  Anti-Infectives:   IV meropenem  Subjective:    Shannon Garrett she relates her pain is not controlled she remains anorexic.  Objective:    Vitals:   07/18/19 2007 07/19/19 0223 07/19/19 0530 07/19/19 0615  BP: 116/72 127/74 (!) 122/96 124/80  Pulse: (!) 113 (!) 126 (!) 132 (!) 125  Resp: 20 (!) 22 20 (!) 21  Temp: 99 F (37.2 C)     TempSrc: Oral     SpO2: 96% 98% 98% 97%   SpO2: 97 %   Intake/Output Summary (Last 24 hours) at 07/19/2019 0703 Last data filed at 07/19/2019 0534 Gross per 24 hour  Intake 101.18 ml  Output --  Net 101.18 ml   There were no vitals filed for this visit.  Exam: General exam: In no acute distress. Respiratory system: Good air movement and clear to auscultation. Cardiovascular system: S1 & S2 heard, RRR. No JVD. Gastrointestinal system: Positive bowel sounds soft  with right upper quadrant tenderness with some guarding no rebound Central nervous system: Alert and oriented.  Extremities: No pedal edema. Skin: No rashes, lesions or ulcers  Data Reviewed:    Labs: Basic Metabolic Panel: Recent Labs  Lab 07/18/19 2151  NA 139  K 3.7  CL 100  CO2 28  GLUCOSE 163*  BUN 14  CREATININE 1.16*  CALCIUM 9.2   GFR CrCl cannot be calculated (Unknown ideal weight.). Liver Function Tests: Recent Labs  Lab 07/18/19 2151  AST 25  ALT 41  ALKPHOS 433*  BILITOT 0.9  PROT 7.7  ALBUMIN 3.5   Recent Labs  Lab 07/18/19 2151  LIPASE 14   No results for input(s): AMMONIA in the last 168 hours. Coagulation profile No results for input(s): INR, PROTIME in the last 168 hours. COVID-19 Labs  No results for input(s): DDIMER, FERRITIN, LDH, CRP in the last 72  hours.  Lab Results  Component Value Date   SARSCOV2NAA POSITIVE (A) 05/10/2019    CBC: Recent Labs  Lab 07/18/19 2151  WBC 13.7*  HGB 11.4*  HCT 35.3*  MCV 85.7  PLT 471*   Cardiac Enzymes: No results for input(s): CKTOTAL, CKMB, CKMBINDEX, TROPONINI in the last 168 hours. BNP (last 3 results) No results for input(s): PROBNP in the last 8760 hours. CBG: Recent Labs  Lab 07/19/19 0500  GLUCAP 169*   D-Dimer: No results for input(s): DDIMER in the last 72 hours. Hgb A1c: No results for input(s): HGBA1C in the last 72 hours. Lipid Profile: No results for input(s): CHOL, HDL, LDLCALC, TRIG, CHOLHDL, LDLDIRECT in the last 72 hours. Thyroid function studies: No results for input(s): TSH, T4TOTAL, T3FREE, THYROIDAB in the last 72 hours.  Invalid input(s): FREET3 Anemia work up: No results for input(s): VITAMINB12, FOLATE, FERRITIN, TIBC, IRON, RETICCTPCT in the last 72 hours. Sepsis Labs: Recent Labs  Lab 07/18/19 2151  WBC 13.7*   Microbiology No results found for this or any previous visit (from the past 240 hour(s)).   Medications:   . insulin aspart  0-9 Units Subcutaneous Q4H  . sodium chloride flush  3 mL Intravenous Q12H   Continuous Infusions: . sodium chloride    . meropenem (MERREM) IV Stopped (07/19/19 0531)     LOS: 0 days   Charlynne Cousins  [facility]  07/19/2019, 7:03 AM

## 2019-07-19 NOTE — Consult Note (Signed)
Shannon Garrett  [birth year] [id number]  CARE TEAM:  PCP: Gildardo Cranker, DO  Outpatient Care Team: Patient Care Team: Gildardo Cranker, DO as PCP - [facility] (Internal [facility]) [facility], [place] ([place])  Inpatient Treatment Team: Treatment Team: Attending Provider: Charlynne Cousins, MD; Rounding Team: Ian Bushman, MD; Registered Nurse: Rosana Fret, RN; Technician: Lucila Maine, NT; Consulting Physician: Edison Pace, Md, MD   This patient is a 59 y.o.female who presents today for surgical evaluation at the request of Dr Aileen Fass.   Chief complaint / Reason for evaluation: Acute cholecystitis  Woman with multiple medical problems including paraplegia with multiple sclerosis, chronic diastolic heart failure.  Hospitalized in November 2020 with urinary tract infection.  ESBL.  COVID-19.  Then developed C. difficile colitis.  Oral vancomycin.  More recently had worsening right-sided abdominal pain with nausea and vomiting.  Decreased appetite.  Ultrasound suspicious for sonographic Murphy sign and gallbladder wall thickening consistent with cholecystitis.  Medical admission.  IV antibiotics.  Placed on meropenem given history of drug-resistant infections in the past.  Surgical consultation requested this morning to consider need for cholecystectomy.  Medically cleared by Dr. Olevia Bowens.  Patient notes that she has had attacks of upper abdominal pain and nausea and vomiting the past.  Usually self-limiting went away.  However this tach would not.  Came the emergency room.  Pain and nausea is less since she was admitted this morning.  Nursing at bedside.  Patient tends to be bedridden and does not mobilize much.  Only abdominal surgery has been a C-section.  She is a diabetic insulin requiring.   Assessment  Shannon Garrett  59 y.o. 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  Plan:  IV antibiotics.  She is on IV meropenem given her history of multidrug-resistant Klebsiella urinary tract infection and C. difficile.  Probably not a good idea to put back on Cipro.  Seems to be clinically better on this continue for now.  Laparoscopic cholecystectomy this admission.  We have to emergency surgery is to still do today, so most likely will postpone till tomorrow.  Decent chance of doing for poor laparoscopic approach.  Would be my partner, Dr. Kae Heller.  Medically cleared by the primary hospitalist internal medicine service  The anatomy & physiology of hepatobiliary & pancreatic function was discussed.  The pathophysiology of gallbladder dysfunction was discussed.  Natural history risks without surgery was discussed.   I feel the risks of no intervention will lead to serious problems that outweigh the operative risks; therefore, I recommended cholecystectomy to remove the pathology.  I explained laparoscopic techniques with possible need for an open approach.  Probable cholangiogram to evaluate the bilary tract was explained as well.    Risks such as bleeding, infection, diarrhea and other bowel changes, abscess, leak, injury to other organs, need for repair of tissues / organs, need for further treatment, stroke, heart attack, death, and other risks were discussed.  I noted a good likelihood this will help address the problem, but there is a chance it may not help.  Possibility that this will not correct  all abdominal symptoms was explained.  Goals of post-operative recovery were discussed as well.  We will work to minimize complications.  An educational handout further explaining the pathology and treatment  options was given as well.  Questions were answered.  The patient expresses understanding & wishes to proceed with surgery.    -VTE prophylaxis- SCDs, etc -mobilize as tolerated to help recovery.  This is limited given her multiple sclerosis and limited mobility.  50 minutes spent in review, evaluation, examination, counseling, and coordination of care.  More than 50% of that time was spent in counseling.  Adin Hector, MD, FACS, MASCRS Gastrointestinal and Minimally Invasive Surgery  [facility] [address], [place],  [address] [phone] Main / Paging [phone] Fax     07/19/2019      Past Medical History:  Diagnosis Date  . Cardiomyopathy ([place])    a.  Echo 04/29/12: Mild LVH, EF 20-25%, mild AI, moderate MR, moderate LAE, mild RAE, mild RVE, moderate TR, PASP 51, small pericardial effusion;   b. probably non-ischemic given multiple chemo-Tx agents used for MS and global LV dysfn on echo  . Chronic systolic heart failure ([place])   . COVID-19 05/10/2019   Per facility paperwork  . Depression   . Glaucoma   . Hypertension   . MS (multiple sclerosis) ([place])    a. Dx'd late 20's. b. Tx with Novantrone, Tysabri, Copaxone previously.    Past Surgical History:  Procedure Laterality Date  . ABLATION     uterine  . CESAREAN SECTION      Social History   Socioeconomic History  . Marital status: Single    Spouse name: Not on file  . Number of children: Not on file  . Years of education: Not on file  . Highest education level: Not on file  Occupational History  . Not on file  Tobacco Use  . Smoking status: Former Smoker    Packs/day: 0.50    Years: 5.00    Pack years: 2.50  . Smokeless tobacco: Never Used  Substance and Sexual Activity  . Alcohol use: No  . Drug use: No  . Sexual activity: Not Currently  Other Topics Concern  . Not on file  Social History Narrative  . Not on file   Social Determinants of Health    Financial Resource Strain:   . Difficulty of Paying Living Expenses: Not on file  Food Insecurity:   . Worried About Charity fundraiser in the Last Year: Not on file  . Ran Out of Food in the Last Year: Not on file  Transportation Needs:   . Lack of Transportation (Medical): Not on file  . Lack of Transportation (Non-Medical): Not on file  Physical Activity:   . Days of Exercise per Week: Not on file  . Minutes of Exercise per Session: Not on file  Stress:   . Feeling of Stress : Not on file  Social Connections:   . Frequency of Communication with Friends and Family: Not on file  . Frequency of Social Gatherings with Friends and Family: Not on file  . Attends Religious Services: Not on file  . Active Member of Clubs or Organizations: Not on file  . Attends Archivist Meetings: Not on file  . Marital Status: Not on file  Intimate Partner Violence:   . Fear of Current or Ex-Partner: Not on file  . Emotionally Abused: Not on file  . Physically Abused:  Not on file  . Sexually Abused: Not on file    Family History  Problem Relation Age of Onset  . Hypertension Mother   . Cancer Mother        breast   . Cancer Father        prostate  . Multiple sclerosis Sister   . Heart attack Neg Hx     Current Facility-Administered Medications  Medication Dose Route Frequency Provider Last Rate Last Admin  . 0.9 %  sodium chloride infusion   Intravenous Continuous Opyd, Ilene Qua, MD 90 mL/hr at 07/19/19 0820 New Bag at 07/19/19 0820  . acetaminophen (TYLENOL) tablet 650 mg  650 mg Oral Q6H PRN Opyd, Ilene Qua, MD       Or  . acetaminophen (TYLENOL) suppository 650 mg  650 mg Rectal Q6H PRN Opyd, Ilene Qua, MD      . fentaNYL (SUBLIMAZE) injection 25-50 mcg  25-50 mcg Intravenous Q2H PRN Opyd, Ilene Qua, MD   50 mcg at 07/19/19 0828  . insulin aspart (novoLOG) injection 0-9 Units  0-9 Units Subcutaneous Q4H Opyd, Ilene Qua, MD   2 Units at 07/19/19 [phone]  . meropenem (MERREM)  1 g in sodium chloride 0.9 % 100 mL IVPB  1 g Intravenous Q8H Opyd, Ilene Qua, MD   Stopped at 07/19/19 0531  . ondansetron (ZOFRAN) tablet 4 mg  4 mg Oral Q6H PRN Opyd, Ilene Qua, MD       Or  . ondansetron (ZOFRAN) injection 4 mg  4 mg Intravenous Q6H PRN Opyd, Ilene Qua, MD      . sodium chloride flush (NS) 0.9 % injection 3 mL  3 mL Intravenous Q12H Opyd, Ilene Qua, MD         Allergies  Allergen Reactions  . Sulfonamide Derivatives Hives and Shortness Of Breath  . Penicillins Hives and Itching    Has patient had a PCN reaction causing immediate rash, facial/tongue/throat swelling, SOB or lightheadedness with hypotension: unknown Has patient had a PCN reaction causing severe rash involving mucus membranes or skin necrosis: unknown Has patient had a PCN reaction that required hospitalization: unknown Has patient had a PCN reaction occurring within the last 10 years: unknown If all of the above answers are "NO", then may proceed with Cephalosporin use. Prior course of rocephin charted 05/2015    ROS:   All other systems reviewed & are negative except per HPI or as noted below: Constitutional:  No fevers, chills, sweats.  Weight stable Eyes:  No vision changes, No discharge HENT:  No sore throats, nasal drainage Lymph: No neck swelling, No bruising easily Pulmonary:  No cough, productive sputum CV: No orthopnea, PND  No exertional chest/neck/shoulder/arm pain. GI:  No personal nor family history of GI/colon cancer, inflammatory bowel disease, irritable bowel syndrome, allergy such as Celiac Sprue, dietary/dairy problems, ulcers nor gastritis.   No travel outside the country.  No changes in diet. Renal: UTIs as noted above.  Not currently., No hematuria Genital:  No drainage, bleeding, masses Musculoskeletal: Limited mobility with multiple sclerosis.  Moderate chronic hip and low back pain. Skin:  No sores or lesions.  No rashes Heme/Lymph:  No easy bleeding.  No swollen lymph  nodes Neuro: No focal weakness/numbness.  No seizures.  Paraplegic Psych: No suicidal ideation.  No hallucinations  BP (!) 158/89 (BP Location: Right Arm)   Pulse (!) 137   Temp 98.7 F (37.1 C) (Oral)   Resp 20   SpO2 97%  Physical Exam: General: Pt awake/alert/oriented x4 in no major acute distress Eyes: PERRL, normal EOM. Sclera nonicteric Neuro: CN II-XII intact w/o focal sensory/motor deficits.  Has some delayed response time and slow slurred speech.  Paraplegic Lymph: No head/neck/groin lymphadenopathy Psych:  No delerium/psychosis/paranoia HENT: Normocephalic, Mucus membranes moist.  No thrush Neck: Supple, No tracheal deviation Chest: No pain.  Good respiratory excursion. CV:  Pulses intact.  Regular rhythm Abdomen: Morbidly obese but soft.  Moderately distended.  Tenderness in right upper quadrant with Murphy sign.  Left of the abdomen is nontender.  Moderate diastases recti.  No incarcerated hernias. Gen:  No inguinal hernias.  No inguinal lymphadenopathy.   Ext:  SCDs BLE.  No significant edema.  No cyanosis Skin: No petechiae / purpurea.  No major sores Musculoskeletal: Moderate chronic hip and low back joint pain.  Fair range of motion on lower extremities   Results:   Labs: Results for orders placed or performed during the hospital encounter of 07/18/19 (from the past 48 hour(s))  Lipase, blood     Status: None   Collection Time: 07/18/19  9:51 PM  Result Value Ref Range   Lipase 14 11 - 51 U/L    Comment: Performed at [facility], [place] [address], [place], [place] [address]  Comprehensive metabolic panel     Status: Abnormal   Collection Time: 07/18/19  9:51 PM  Result Value Ref Range   Sodium 139 135 - 145 mmol/L   Potassium 3.7 3.5 - 5.1 mmol/L   Chloride 100 98 - 111 mmol/L   CO2 28 22 - 32 mmol/L   Glucose, Bld 163 (H) 70 - 99 mg/dL   BUN 14 6 - 20 mg/dL   Creatinine, Ser 1.16 (H) 0.44 - 1.00 mg/dL   Calcium 9.2 8.9 - 10.3 mg/dL    Total Protein 7.7 6.5 - 8.1 g/dL   Albumin 3.5 3.5 - 5.0 g/dL   AST 25 15 - 41 U/L   ALT 41 0 - 44 U/L   Alkaline Phosphatase 433 (H) 38 - 126 U/L   Total Bilirubin 0.9 0.3 - 1.2 mg/dL   GFR calc non Af Amer 53 (L) >60 mL/min   GFR calc Af Amer >60 >60 mL/min   Anion gap 11 5 - 15    Comment: Performed at [facility], [place] [address], [place], [place] [address]  CBC     Status: Abnormal   Collection Time: 07/18/19  9:51 PM  Result Value Ref Range   WBC 13.7 (H) 4.0 - 10.5 K/uL   RBC 4.12 3.87 - 5.11 MIL/uL   Hemoglobin 11.4 (L) 12.0 - 15.0 g/dL   HCT 35.3 (L) 36.0 - 46.0 %   MCV 85.7 80.0 - 100.0 fL   MCH 27.7 26.0 - 34.0 pg   MCHC 32.3 30.0 - 36.0 g/dL   RDW 16.7 (H) 11.5 - 15.5 %   Platelets 471 (H) 150 - 400 K/uL   nRBC 0.0 0.0 - 0.2 %    Comment: Performed at [facility], [place] [address] [place], [place] [address]  I-Stat beta hCG blood, ED     Status: None   Collection Time: 07/18/19 10:07 PM  Result Value Ref Range   I-stat hCG, quantitative <5.0 <5 mIU/mL   Comment 3            Comment:   GEST. AGE      CONC.  (mIU/mL)   <=1 WEEK  5 - 50     2 WEEKS       50 - 500     3 WEEKS       100 - 10,000     4 WEEKS     1,000 - 30,000        FEMALE AND NON-PREGNANT FEMALE:     LESS THAN 5 mIU/mL   CBG monitoring, ED     Status: Abnormal   Collection Time: 07/19/19  5:00 AM  Result Value Ref Range   Glucose-Capillary 169 (H) 70 - 99 mg/dL  Glucose, capillary     Status: Abnormal   Collection Time: 07/19/19  7:43 AM  Result Value Ref Range   Glucose-Capillary 165 (H) 70 - 99 mg/dL  CBC WITH DIFFERENTIAL     Status: Abnormal   Collection Time: 07/19/19  8:30 AM  Result Value Ref Range   WBC 15.1 (H) 4.0 - 10.5 K/uL   RBC 3.99 3.87 - 5.11 MIL/uL   Hemoglobin 11.1 (L) 12.0 - 15.0 g/dL   HCT 33.9 (L) 36.0 - 46.0 %   MCV 85.0 80.0 - 100.0 fL   MCH 27.8 26.0 - 34.0 pg   MCHC 32.7 30.0 - 36.0 g/dL   RDW 16.7 (H) 11.5 - 15.5 %    Platelets 482 (H) 150 - 400 K/uL   nRBC 0.0 0.0 - 0.2 %   Neutrophils Relative % 77 %   Neutro Abs 11.7 (H) 1.7 - 7.7 K/uL   Lymphocytes Relative 11 %   Lymphs Abs 1.6 0.7 - 4.0 K/uL   Monocytes Relative 8 %   Monocytes Absolute 1.2 (H) 0.1 - 1.0 K/uL   Eosinophils Relative 2 %   Eosinophils Absolute 0.3 0.0 - 0.5 K/uL   Basophils Relative 1 %   Basophils Absolute 0.2 (H) 0.0 - 0.1 K/uL   Immature Granulocytes 1 %   Abs Immature Granulocytes 0.12 (H) 0.00 - 0.07 K/uL    Comment: Performed at [facility], [place] [address] [place], [place] [address]    Imaging / Studies: CT Abdomen Pelvis W Contrast  Result Date: 06/29/2019 CLINICAL DATA:  Right-sided abdominal pain and recent C difficile infection EXAM: CT ABDOMEN AND PELVIS WITH CONTRAST TECHNIQUE: Multidetector CT imaging of the abdomen and pelvis was performed using the standard protocol following bolus administration of intravenous contrast. CONTRAST:  177mL OMNIPAQUE IOHEXOL 300 MG/ML  SOLN COMPARISON:  05/25/2013 FINDINGS: Lower chest: Mild scarring is noted in the left lung base. Stable nodule in the left base unchanged from the prior exam. Hepatobiliary: Mild fatty infiltration of the liver is noted. The gallbladder is well distended. Few small gallstones are noted within the gallbladder stable from the previous exam. Pancreas: Unremarkable. No pancreatic ductal dilatation or surrounding inflammatory changes. Spleen: Normal in size without focal abnormality. Adrenals/Urinary Tract: Adrenal glands are within normal limits bilaterally. Scattered cysts are seen within the kidneys bilaterally. Normal excretion of contrast is noted. No obstructive changes are seen. The bladder is well distended. Stomach/Bowel: Appendix is well visualized within normal limits. Scattered minimal diverticular change of the colon is noted without evidence of diverticulitis. No small bowel abnormality is seen. The stomach is decompressed.  Vascular/Lymphatic: Aortic atherosclerosis. No enlarged abdominal or pelvic lymph nodes. Reproductive: Calcified uterine fibroid is noted stable from the prior exam. Uterus is otherwise within normal limits. No adnexal mass is seen. Other: No free fluid is noted.  No herniation is seen. Musculoskeletal: Degenerative changes of the lumbar spine are  seen. Mild degenerative anterolisthesis of L4 on L5 is noted. This is stable from the prior exam. IMPRESSION: Stable cholelithiasis without complicating factors. Calcified uterine fibroids. Fatty infiltration of the liver. No acute abnormality noted. Electronically Signed   By: Inez Catalina M.D.   On: 06/29/2019 10:28   US Abdomen Limited RUQ  Result Date: 07/19/2019 CLINICAL DATA:  Right upper quadrant pain. Gallstones on recent CT. Elevated white blood cell count. EXAM: ULTRASOUND ABDOMEN LIMITED RIGHT UPPER QUADRANT COMPARISON:  Abdominal CT 06/29/2019 FINDINGS: Gallbladder: Distended with multiple intraluminal gallstones. Mild gallbladder wall thickening of 3-4 mm. No pericholecystic fluid. Positive sonographic Murphy sign noted by sonographer. Common bile duct: Diameter: 3 mm, normal. Liver: Heterogeneously increased in parenchymal echogenicity. The liver parenchyma is difficult to penetrate. No evidence of focal lesion. Portal vein is patent on color Doppler imaging with normal direction of blood flow towards the liver. Other: None. IMPRESSION: 1. Gallstones with mild gallbladder wall thickening and positive sonographic Murphy sign. Findings are consistent with acute cholecystitis in the appropriate clinical setting. 2. No biliary dilatation. 3. Hepatic steatosis. Electronically Signed   By: Keith Rake M.D.   On: 07/19/2019 00:50    Medications / Allergies: per chart  Antibiotics: Anti-infectives (From admission, onward)   Start     Dose/Rate Route Frequency Ordered Stop   07/19/19 0415  meropenem (MERREM) 1 g in sodium chloride 0.9 % 100 mL IVPB       1 g 200 mL/hr over 30 Minutes Intravenous Every 8 hours 07/19/19 0409     07/19/19 0200  ciprofloxacin (CIPRO) IVPB 400 mg     400 mg 200 mL/hr over 60 Minutes Intravenous  Once 07/19/19 0146 07/19/19 0447        Note: Portions of this report may have been transcribed using voice recognition software. Every effort was made to ensure accuracy; however, inadvertent computerized transcription errors may be present.   Any transcriptional errors that result from this process are unintentional.    Adin Hector, MD, FACS, MASCRS Gastrointestinal and Minimally Invasive Surgery  [facility] [address] [place], [place] [address] [phone] Main / Paging [phone] Fax     07/19/2019

## 2019-07-19 NOTE — Progress Notes (Signed)
Pharmacy Antibiotic Note  Kami Maulden is a 59 y.o. female admitted on 07/18/2019 with acute cholecystitis, recent sepsis secondary to ESBL, has reported penicillin allergy, tolerated meropenem in November '20.  Pharmacy has been consulted for Meropenem dosing.  Plan: Meropenem 1gm iv q8hr     Temp (24hrs), Avg:99 F (37.2 C), Min:99 F (37.2 C), Max:99 F (37.2 C)  Recent Labs  Lab 07/18/19 2151  WBC 13.7*  CREATININE 1.16*    CrCl cannot be calculated (Unknown ideal weight.).    Allergies  Allergen Reactions  . Sulfonamide Derivatives Hives and Shortness Of Breath  . Penicillins Hives and Itching    Has patient had a PCN reaction causing immediate rash, facial/tongue/throat swelling, SOB or lightheadedness with hypotension: unknown Has patient had a PCN reaction causing severe rash involving mucus membranes or skin necrosis: unknown Has patient had a PCN reaction that required hospitalization: unknown Has patient had a PCN reaction occurring within the last 10 years: unknown If all of the above answers are "NO", then may proceed with Cephalosporin use. Prior course of rocephin charted 05/2015    Antimicrobials this admission: Meropenem 07/19/2019 >>  Dose adjustments this admission:    Thank you for allowing pharmacy to be a part of this patient's care.  Nani Skillern Crowford 07/19/2019 4:09 AM

## 2019-07-19 NOTE — Progress Notes (Signed)
  Echocardiogram 2D Echocardiogram has been performed.  Johny Chess 07/19/2019, 6:05 PM

## 2019-07-20 ENCOUNTER — Encounter (HOSPITAL_COMMUNITY): Payer: Self-pay | Admitting: Family Medicine

## 2019-07-20 ENCOUNTER — Inpatient Hospital Stay (HOSPITAL_COMMUNITY): Payer: Medicare (Managed Care) | Admitting: Certified Registered"

## 2019-07-20 ENCOUNTER — Encounter (HOSPITAL_COMMUNITY)
Admission: EM | Disposition: A | Discharge status: Skilled Nursing Facility | Payer: Self-pay | Source: Home / Self Care | Attending: Internal Medicine

## 2019-07-20 ENCOUNTER — Inpatient Hospital Stay (HOSPITAL_COMMUNITY): Payer: Medicare (Managed Care)

## 2019-07-20 DIAGNOSIS — Z01810 Encounter for preprocedural cardiovascular examination: Secondary | ICD-10-CM

## 2019-07-20 HISTORY — PX: CHOLECYSTECTOMY: SHX55

## 2019-07-20 LAB — BASIC METABOLIC PANEL
Anion gap: 8 (ref 5–15)
BUN: 13 mg/dL (ref 6–20)
CO2: 27 mmol/L (ref 22–32)
Calcium: 8.2 mg/dL — ABNORMAL LOW (ref 8.9–10.3)
Chloride: 108 mmol/L (ref 98–111)
Creatinine, Ser: 1.06 mg/dL — ABNORMAL HIGH (ref 0.44–1.00)
GFR calc Af Amer: >60 mL/min (ref >60)
GFR calc non Af Amer: 59 mL/min — ABNORMAL LOW (ref >60)
Glucose, Bld: 128 mg/dL — ABNORMAL HIGH (ref 70–99)
Potassium: 3.8 mmol/L (ref 3.5–5.1)
Sodium: 143 mmol/L (ref 135–145)

## 2019-07-20 LAB — GLUCOSE, CAPILLARY
Glucose-Capillary: 119 mg/dL — ABNORMAL HIGH (ref 70–99)
Glucose-Capillary: 129 mg/dL — ABNORMAL HIGH (ref 70–99)
Glucose-Capillary: 116 mg/dL — ABNORMAL HIGH (ref 70–99)
Glucose-Capillary: 149 mg/dL — ABNORMAL HIGH (ref 70–99)
Glucose-Capillary: 208 mg/dL — ABNORMAL HIGH (ref 70–99)
Glucose-Capillary: 243 mg/dL — ABNORMAL HIGH (ref 70–99)

## 2019-07-20 LAB — SURGICAL PCR SCREEN
MRSA, PCR: POSITIVE — AB
Staphylococcus aureus: POSITIVE — AB

## 2019-07-20 SURGERY — LAPAROSCOPIC CHOLECYSTECTOMY WITH INTRAOPERATIVE CHOLANGIOGRAM
Anesthesia: General

## 2019-07-20 MED ORDER — PROPOFOL 10 MG/ML IV BOLUS
INTRAVENOUS | Status: DC | PRN
  Administered 2019-07-20: 150 mg via INTRAVENOUS

## 2019-07-20 MED ORDER — HEPARIN SODIUM (PORCINE) 5000 UNIT/ML IJ SOLN: 5000.0000 [IU] | Freq: Three times a day (TID) | INTRAMUSCULAR | Status: DC

## 2019-07-20 MED ORDER — SUGAMMADEX SODIUM 200 MG/2ML IV SOLN
INTRAVENOUS | Status: DC | PRN
  Administered 2019-07-20: 300 mg via INTRAVENOUS

## 2019-07-20 MED ORDER — FLUCONAZOLE 150 MG PO TABS
150.0000 mg | ORAL_TABLET | Freq: Every day | ORAL | Status: AC
  Administered 2019-07-20: 150 mg via ORAL
  Filled 2019-07-20: qty 1

## 2019-07-20 MED ORDER — DEXAMETHASONE SODIUM PHOSPHATE 10 MG/ML IJ SOLN
INTRAMUSCULAR | Status: DC | PRN
  Administered 2019-07-20: 10 mg via INTRAVENOUS

## 2019-07-20 MED ORDER — ALBUTEROL SULFATE (2.5 MG/3ML) 0.083% IN NEBU: 2.5000 mg | INHALATION_SOLUTION | Freq: Four times a day (QID) | RESPIRATORY_TRACT | Status: DC | PRN

## 2019-07-20 MED ORDER — ROCURONIUM BROMIDE 10 MG/ML (PF) SYRINGE
PREFILLED_SYRINGE | INTRAVENOUS | Status: AC
  Filled 2019-07-20: qty 10

## 2019-07-20 MED ORDER — LACTATED RINGERS IV SOLN: INTRAVENOUS | Status: DC | PRN

## 2019-07-20 MED ORDER — FENTANYL CITRATE (PF) 100 MCG/2ML IJ SOLN: 25.0000 ug | INTRAMUSCULAR | Status: DC | PRN

## 2019-07-20 MED ORDER — BUPIVACAINE HCL 0.25 % IJ SOLN
INTRAMUSCULAR | Status: AC
  Filled 2019-07-20: qty 1

## 2019-07-20 MED ORDER — PROPOFOL 10 MG/ML IV BOLUS
INTRAVENOUS | Status: AC
  Filled 2019-07-20: qty 20

## 2019-07-20 MED ORDER — DEXAMETHASONE SODIUM PHOSPHATE 10 MG/ML IJ SOLN
INTRAMUSCULAR | Status: AC
  Filled 2019-07-20: qty 1

## 2019-07-20 MED ORDER — SACCHAROMYCES BOULARDII 250 MG PO CAPS
250.0000 mg | ORAL_CAPSULE | Freq: Two times a day (BID) | ORAL | Status: DC
  Administered 2019-07-20 – 2019-07-23 (×6): 250 mg via ORAL
  Filled 2019-07-20 (×6): qty 1

## 2019-07-20 MED ORDER — LIDOCAINE 2% (20 MG/ML) 5 ML SYRINGE
INTRAMUSCULAR | Status: AC
  Filled 2019-07-20: qty 5

## 2019-07-20 MED ORDER — ONDANSETRON HCL 4 MG/2ML IJ SOLN
INTRAMUSCULAR | Status: DC | PRN
  Administered 2019-07-20: 4 mg via INTRAVENOUS

## 2019-07-20 MED ORDER — SODIUM CHLORIDE 0.9 % IV SOLN
1.0000 g | Freq: Once | INTRAVENOUS | Status: AC
  Administered 2019-07-20: 1 g via INTRAVENOUS
  Filled 2019-07-20: qty 1

## 2019-07-20 MED ORDER — FENTANYL CITRATE (PF) 250 MCG/5ML IJ SOLN
INTRAMUSCULAR | Status: DC | PRN
  Administered 2019-07-20: 100 ug via INTRAVENOUS
  Administered 2019-07-20 (×2): 50 ug via INTRAVENOUS

## 2019-07-20 MED ORDER — MIDAZOLAM HCL 2 MG/2ML IJ SOLN
INTRAMUSCULAR | Status: AC
  Filled 2019-07-20: qty 2

## 2019-07-20 MED ORDER — 0.9 % SODIUM CHLORIDE (POUR BTL) OPTIME
TOPICAL | Status: DC | PRN
  Administered 2019-07-20: 1000 mL

## 2019-07-20 MED ORDER — KCL IN DEXTROSE-NACL 20-5-0.45 MEQ/L-%-% IV SOLN
INTRAVENOUS | Status: DC
  Filled 2019-07-20 (×3): qty 1000

## 2019-07-20 MED ORDER — LIDOCAINE 2% (20 MG/ML) 5 ML SYRINGE
INTRAMUSCULAR | Status: DC | PRN
  Administered 2019-07-20: 100 mg via INTRAVENOUS

## 2019-07-20 MED ORDER — CHLORHEXIDINE GLUCONATE CLOTH 2 % EX PADS
6.0000 | MEDICATED_PAD | Freq: Every day | CUTANEOUS | Status: DC
  Administered 2019-07-21 – 2019-07-23 (×3): 6 via TOPICAL

## 2019-07-20 MED ORDER — DILTIAZEM LOAD VIA INFUSION
15.0000 mg | Freq: Once | INTRAVENOUS | Status: AC
  Administered 2019-07-20: 15 mg via INTRAVENOUS
  Filled 2019-07-20: qty 15

## 2019-07-20 MED ORDER — ONDANSETRON HCL 4 MG/2ML IJ SOLN: 4.0000 mg | Freq: Once | INTRAMUSCULAR | Status: DC | PRN

## 2019-07-20 MED ORDER — SUCCINYLCHOLINE CHLORIDE 200 MG/10ML IV SOSY
PREFILLED_SYRINGE | INTRAVENOUS | Status: AC
  Filled 2019-07-20: qty 10

## 2019-07-20 MED ORDER — FENTANYL CITRATE (PF) 100 MCG/2ML IJ SOLN
INTRAMUSCULAR | Status: AC
  Filled 2019-07-20: qty 2

## 2019-07-20 MED ORDER — POTASSIUM CHLORIDE 2 MEQ/ML IV SOLN: INTRAVENOUS | Status: DC

## 2019-07-20 MED ORDER — TRAMADOL HCL 50 MG PO TABS
50.0000 mg | ORAL_TABLET | Freq: Four times a day (QID) | ORAL | Status: DC | PRN
  Administered 2019-07-20 – 2019-07-22 (×4): 50 mg via ORAL
  Filled 2019-07-20 (×4): qty 1

## 2019-07-20 MED ORDER — ROCURONIUM BROMIDE 10 MG/ML (PF) SYRINGE
PREFILLED_SYRINGE | INTRAVENOUS | Status: DC | PRN
  Administered 2019-07-20: 50 mg via INTRAVENOUS

## 2019-07-20 MED ORDER — DILTIAZEM HCL-DEXTROSE 125-5 MG/125ML-% IV SOLN (PREMIX)
5.0000 mg/h | INTRAVENOUS | Status: DC
  Administered 2019-07-20 – 2019-07-21 (×2): 5 mg/h via INTRAVENOUS
  Filled 2019-07-20 (×3): qty 125

## 2019-07-20 MED ORDER — LACTATED RINGERS IV SOLN
INTRAVENOUS | Status: AC | PRN
  Administered 2019-07-20: 1000 mL

## 2019-07-20 MED ORDER — SUGAMMADEX SODIUM 500 MG/5ML IV SOLN
INTRAVENOUS | Status: AC
  Filled 2019-07-20: qty 5

## 2019-07-20 MED ORDER — MUPIROCIN 2 % EX OINT
1.0000 "application " | TOPICAL_OINTMENT | Freq: Two times a day (BID) | CUTANEOUS | Status: DC
  Administered 2019-07-20 – 2019-07-23 (×6): 1 via NASAL
  Filled 2019-07-20 (×2): qty 22

## 2019-07-20 MED ORDER — FENTANYL CITRATE (PF) 250 MCG/5ML IJ SOLN
INTRAMUSCULAR | Status: AC
  Filled 2019-07-20: qty 5

## 2019-07-20 MED ORDER — HEPARIN SODIUM (PORCINE) 5000 UNIT/ML IJ SOLN
5000.0000 [IU] | Freq: Once | INTRAMUSCULAR | Status: AC
  Administered 2019-07-20: 5000 [IU] via SUBCUTANEOUS
  Filled 2019-07-20: qty 1

## 2019-07-20 MED ORDER — ONDANSETRON HCL 4 MG/2ML IJ SOLN
INTRAMUSCULAR | Status: AC
  Filled 2019-07-20: qty 2

## 2019-07-20 SURGICAL SUPPLY — 39 items
ADH SKN CLS APL DERMABOND .7 (GAUZE/BANDAGES/DRESSINGS) ×1
APL PRP STRL LF DISP 70% ISPRP (MISCELLANEOUS) ×1
APPLIER CLIP ROT 10 11.4 M/L (STAPLE) ×3
APR CLP MED LRG 11.4X10 (STAPLE) ×1
BAG SPEC RTRVL LRG 6X4 10 (ENDOMECHANICALS) ×1
CABLE HIGH FREQUENCY MONO STRZ (ELECTRODE) ×3 IMPLANT
CHLORAPREP W/TINT 26 (MISCELLANEOUS) ×3 IMPLANT
CLIP APPLIE ROT 10 11.4 M/L (STAPLE) ×1 IMPLANT
COVER MAYO STAND STRL (DRAPES) IMPLANT
COVER SURGICAL LIGHT HANDLE (MISCELLANEOUS) ×3 IMPLANT
COVER WAND RF STERILE (DRAPES) IMPLANT
DECANTER SPIKE VIAL GLASS SM (MISCELLANEOUS) ×3 IMPLANT
DERMABOND ADVANCED (GAUZE/BANDAGES/DRESSINGS) ×2
DERMABOND ADVANCED .7 DNX12 (GAUZE/BANDAGES/DRESSINGS) ×1 IMPLANT
DRAPE C-ARM 42X120 X-RAY (DRAPES) ×2 IMPLANT
ELECT REM PT RETURN 15FT ADLT (MISCELLANEOUS) ×3 IMPLANT
GLOVE BIO SURGEON STRL SZ 6 (GLOVE) ×3 IMPLANT
GLOVE INDICATOR 6.5 STRL GRN (GLOVE) ×3 IMPLANT
GOWN STRL REUS W/TWL LRG LVL3 (GOWN DISPOSABLE) ×3 IMPLANT
GOWN STRL REUS W/TWL XL LVL3 (GOWN DISPOSABLE) ×6 IMPLANT
GRASPER SUT TROCAR 14GX15 (MISCELLANEOUS) IMPLANT
HEMOSTAT SNOW SURGICEL 2X4 (HEMOSTASIS) IMPLANT
KIT BASIN OR (CUSTOM PROCEDURE TRAY) ×3 IMPLANT
KIT TURNOVER KIT A (KITS) IMPLANT
NDL INSUFFLATION 14GA 120MM (NEEDLE) ×1 IMPLANT
NEEDLE INSUFFLATION 14GA 120MM (NEEDLE) ×3 IMPLANT
PENCIL SMOKE EVACUATOR (MISCELLANEOUS) IMPLANT
POUCH SPECIMEN RETRIEVAL 10MM (ENDOMECHANICALS) ×3 IMPLANT
SCISSORS LAP 5X35 DISP (ENDOMECHANICALS) ×3 IMPLANT
SET CHOLANGIOGRAPH MIX (MISCELLANEOUS) IMPLANT
SET IRRIG TUBING LAPAROSCOPIC (IRRIGATION / IRRIGATOR) ×3 IMPLANT
SET TUBE SMOKE EVAC HIGH FLOW (TUBING) ×3 IMPLANT
SLEEVE XCEL OPT CAN 5 100 (ENDOMECHANICALS) ×6 IMPLANT
SUT MNCRL AB 4-0 PS2 18 (SUTURE) ×3 IMPLANT
TOWEL OR 17X26 10 PK STRL BLUE (TOWEL DISPOSABLE) ×3 IMPLANT
TOWEL OR NON WOVEN STRL DISP B (DISPOSABLE) ×2 IMPLANT
TRAY LAPAROSCOPIC (CUSTOM PROCEDURE TRAY) ×3 IMPLANT
TROCAR BLADELESS OPT 5 100 (ENDOMECHANICALS) ×3 IMPLANT
TROCAR XCEL 12X100 BLDLESS (ENDOMECHANICALS) ×3 IMPLANT

## 2019-07-20 NOTE — Progress Notes (Addendum)
[facility] PROGRESS NOTE    Progress Note  Shannon Garrett  [id number] DOB: [birth year] DOA: 07/18/2019 PCP: Gildardo Cranker, DO     Brief Narrative:   Shannon Garrett is an 59 y.o. female past medical history significant for hypertension, bedbound multiple sclerosis, chronic diastolic heart failure presents to the emergency room right-sided abdominal pain nausea and anorexia she was recently admitted on November 2020 with COVID-19 and sepsis secondary to ESBL UTI for which she completed her treatment subsequently she developed C. difficile colitis for which she completed her treatment with oral vancomycin, she denies any diarrhea right upper quadrant ultrasound showed acute calculus cholecystitis she was started empirically on treatment.  Assessment/Plan:   Sepsis due to acute calculous cholecystitis: Abdominal ultrasound on admission showed gallstones with mild gallbladder wall thickening and positive Murphy sign no bile duct dilation. She was fluid resuscitated started on IV fluids and analgesics, empirically she was started on meropenem due to history of ESBL. She was placed n.p.o. surgery was consulted they recommended lap chole.  Preop evaluation: Due to her recent sinus tachycardia and heart controlled heart rate will consult cardiology. 2D echo showed an ejection fraction 40 to 45%, left ventricular global hypokinesis with moderate elevated pulmonary pressures.  Sinus tachycardia: She became significant tachycardic with a heart rate greater than 150 on 07/19/2019 she was started on IV metoprolol and oral with fair control of her heart rate between 100-110, this morning she was started on IV diltiazem drip. We will consult cardiology.  Insulin-dependent diabetes mellitus type 2: With an A1c of 7.6 in November 2020, currently n.p.o. continue sliding scale, she was started on D5 half-normal with potassium supplement.  Advanced MS: Nonambulatory at baseline continue supportive  care. Consult physical therapy.  Chronic combined systolic diastolic heart failure: She is currently on oral metoprolol and IV metoprolol as needed.  2D echo done on 07/19/2019 with an EF of 40% with left ventricular global hypokinesia.  Acute kidney injury: Likely prerenal azotemia, resolved with IV fluid hydration her creatinine returned to baseline.  Stage II sacral decubitus ulcer present on admission: Consult wound care. RN Pressure Injury Documentation: Pressure Injury 05/11/19 Buttocks Left;Posterior Stage II -  Partial thickness loss of dermis presenting as a shallow open ulcer with a red, pink wound bed without slough. stage two in the crease of left bottucks (Active)  05/11/19 1015  Location: Buttocks  Location Orientation: Left;Posterior  Staging: Stage II -  Partial thickness loss of dermis presenting as a shallow open ulcer with a red, pink wound bed without slough.  Wound Description (Comments): stage two in the crease of left bottucks  Present on Admission: Yes    Estimated body mass index is 44.02 kg/m as calculated from the following:   Height as of 05/10/19: 5\' 5"  (1.651 m).   Weight as of 05/10/19: 120 kg.   DVT prophylaxis: Lovenox Family Communication: None Disposition Plan/Barrier to D/C: When she has lap chole.  Patient came from skilled nursing facility will probably need skilled nursing facility.  Code Status:     Code Status Orders  (From admission, onward)           Start     Ordered   07/19/19 0401  Do not attempt resuscitation (DNR)  Continuous    Question Answer Comment  In the event of cardiac or respiratory ARREST Do not call a "code blue"   In the event of cardiac or respiratory ARREST Do not perform Intubation, CPR, defibrillation or ACLS  In the event of cardiac or respiratory ARREST Use medication by any route, position, wound care, and other measures to relive pain and suffering. May use oxygen, suction and manual treatment of airway  obstruction as needed for comfort.      07/19/19 0400           Code Status History     Date Active Date Inactive Code Status Order ID Comments User Context   05/10/2019 1940 05/18/2019 1732 DNR [id number]  Verlee Monte, MD ED   05/10/2019 1431 05/10/2019 1940 DNR [id number]  Lorin Glass, PA-C ED   11/07/2015 2300 11/10/2015 1615 DNR [id number]  Norval Morton, MD ED   07/21/2015 1338 07/25/2015 2137 DNR [id number]  Rush Farmer, MD Inpatient   07/10/2015 2024 07/21/2015 1338 Full Code [id number]  Juanito Doom, MD Inpatient   07/04/2015 1647 07/08/2015 1403 Full Code [id number]  Louellen Molder, MD Inpatient   06/08/2015 2210 06/12/2015 1610 Full Code [id number]  Theressa Millard, MD Inpatient   05/31/2015 0334 06/04/2015 1731 Full Code [id number]  Reubin Milan, MD Inpatient   01/11/2015 0242 01/17/2015 1846 Full Code [id number]  Allyne Gee, MD Inpatient   02/23/2014 2232 01/11/2015 0242 DNR [id number]  Hennie Duos, MD Outpatient   02/20/2014 0358 02/22/2014 1937 DNR [id number]  Toy Baker, MD ED   11/04/2013 1353 02/20/2014 0358 Full Code [id number]  Hennie Duos, MD Outpatient   07/31/2013 1932 08/02/2013 1650 Full Code [id number]  Allie Bossier, MD Inpatient   05/25/2013 0200 05/26/2013 1653 Full Code [id number]  Rise Patience, MD ED   09/10/2012 0211 09/11/2012 1806 Full Code [id number]  Samuella Cota, MD Inpatient   09/04/2012 1605 09/08/2012 2046 Full Code [id number]  Nita Sells, MD Inpatient   04/28/2012 2308 05/01/2012 1928 Full Code [id number]  Domingo Dimes, RN Inpatient   Advance Care Planning Activity         IV Access:   Peripheral IV   Procedures and diagnostic studies:   ECHOCARDIOGRAM COMPLETE  Result Date: 07/19/2019   ECHOCARDIOGRAM LIMITED REPORT   Patient Name:   Shannon Garrett Date of Exam: 07/19/2019 Medical Rec #:  [id number]     Height:       65.0 in Accession #:    [id number]    Weight:       264.6 lb Date of Birth:   1961/06/21     BSA:          2.23 m Patient Age:    43 years      BP:           132/73 mmHg Patient Gender: F             HR:           102 bpm. Exam Location:  Inpatient  Procedure: 2D Echo STAT ECHO Indications:    chest pain 786.50  History:        Patient has prior history of Echocardiogram examinations, most                 recent 09/20/2017.  Sonographer:    Johny Chess Referring Phys: [id number] Shannon Garrett FELIZ ORTIZ  Sonographer Comments: Patient is morbidly obese. IMPRESSIONS  1. Left ventricular ejection fraction, by visual estimation, is 40 to 45%. The left ventricle has mildly decreased function. There is no increased left ventricular wall thickness.  2. Indeterminate diastolic filling due to E-A fusion.  3. The left ventricle demonstrates global hypokinesis.  4. The right ventricle has normal systolic function.The right ventricular size is normal.  5. The mitral valve is normal in structure. Mild mitral valve regurgitation.  6. The tricuspid valve was normal in structure.  7. Tricuspid valve regurgitation is mild-moderate.  8. Moderately elevated pulmonary artery systolic pressure.  9. Compared to 2016 and 2019 reports, LV systolic function has deteriorated. Side by side comparison was not available. FINDINGS  Left Ventricle: Left ventricular ejection fraction, by visual estimation, is 40 to 45%. The left ventricle has mildly decreased function. The left ventricle demonstrates global hypokinesis. The left ventricular internal cavity size was the left ventricle is normal in size. There is no increased left ventricular wall thickness. Indeterminate diastolic filling due to E-A fusion. Right Ventricle: The right ventricular size is normal. No increase in right ventricular wall thickness. Global RV systolic function is has normal systolic function. The tricuspid regurgitant velocity is 3.31 m/s, and with an assumed right atrial pressure  of 10 mmHg, the estimated right ventricular systolic pressure is  moderately elevated at 53.8 mmHg. Left Atrium: Left atrial size was normal. Right Atrium: Right atrial size was normal. Right atrial pressure is estimated at 10 mmHg. Pericardium: There is no evidence of pericardial effusion is seen. There is no evidence of pericardial effusion. Mitral Valve: The mitral valve is normal in structure. Mild mitral valve regurgitation. Tricuspid Valve: The tricuspid valve is normal in structure. Tricuspid valve regurgitation mild-moderate. Aortic Valve: The aortic valve is normal in structure. Aortic valve regurgitation is mild. Pulmonic Valve: The pulmonic valve was not well visualized. Pulmonic valve regurgitation is not visualized by color flow Doppler. Pulmonic regurgitation is not visualized by color flow Doppler. Aorta: The aortic root, ascending aorta and aortic arch are all structurally normal, with no evidence of dilitation or obstruction. Shunts: No atrial level shunt detected by color flow Doppler.  LEFT VENTRICLE PLAX 2D LVIDd:         5.05 cm LVIDs:         3.98 cm LV PW:         0.90 cm LV IVS:        0.90 cm LV SV:         52 ml LV SV Index:   21.50  RIGHT VENTRICLE RV S prime:     12.70 cm/s TAPSE (M-mode): 1.2 cm LEFT ATRIUM             Index       RIGHT ATRIUM           Index LA diam:        3.50 cm 1.57 cm/m  RA Area:     13.10 cm LA Vol (A2C):   26.4 ml 11.85 ml/m RA Volume:   33.30 ml  14.95 ml/m LA Vol (A4C):   38.0 ml 17.06 ml/m LA Biplane Vol: 33.5 ml 15.04 ml/m  AORTIC VALVE LVOT Vmax:   89.40 cm/s LVOT Vmean:  61.400 cm/s LVOT VTI:    0.133 m  AORTA Ao Root diam: 2.70 cm TRICUSPID VALVE TR Peak grad:   43.8 mmHg TR Vmax:        331.00 cm/s  SHUNTS Systemic VTI: 0.13 m  Dani Gobble Croitoru MD Electronically signed by Sanda Klein MD Signature Date/Time: 07/19/2019/7:02:08 PM    Final    US Abdomen Limited RUQ  Result Date: 07/19/2019 CLINICAL DATA:  Right upper quadrant pain. Gallstones on recent CT. Elevated white blood cell count. EXAM: ULTRASOUND  ABDOMEN LIMITED RIGHT UPPER QUADRANT  COMPARISON:  Abdominal CT 06/29/2019 FINDINGS: Gallbladder: Distended with multiple intraluminal gallstones. Mild gallbladder wall thickening of 3-4 mm. No pericholecystic fluid. Positive sonographic Murphy sign noted by sonographer. Common bile duct: Diameter: 3 mm, normal. Liver: Heterogeneously increased in parenchymal echogenicity. The liver parenchyma is difficult to penetrate. No evidence of focal lesion. Portal vein is patent on color Doppler imaging with normal direction of blood flow towards the liver. Other: None. IMPRESSION: 1. Gallstones with mild gallbladder wall thickening and positive sonographic Murphy sign. Findings are consistent with acute cholecystitis in the appropriate clinical setting. 2. No biliary dilatation. 3. Hepatic steatosis. Electronically Signed   By: Keith Rake M.D.   On: 07/19/2019 00:50     Medical Consultants:   None.  Anti-Infectives:   IV meropenem  Subjective:    Shannon Garrett relates her pain is controlled she does not have palpitations.  Objective:    Vitals:   07/19/19 1432 07/19/19 1758 07/20/19 0205 07/20/19 0441  BP: (!) 153/98 132/73 122/62 122/70  Pulse: (!) 124 (!) 103 (!) 111 (!) 115  Resp: 20 19 18 18   Temp: 98.3 F (36.8 C) 98.8 F (37.1 C) 99.8 F (37.7 C) 98.4 F (36.9 C)  TempSrc: Oral Oral Oral Oral  SpO2: 96% 95% 97% 100%   SpO2: 100 %   Intake/Output Summary (Last 24 hours) at 07/20/2019 0748 Last data filed at 07/20/2019 0200 Gross per 24 hour  Intake 1576.75 ml  Output 500 ml  Net 1076.75 ml   There were no vitals filed for this visit.  Exam: General exam: In no acute distress. Respiratory system: Good air movement and clear to auscultation. Cardiovascular system: S1 & S2 heard, RRR. No JVD. Gastrointestinal system: Positive bowel sounds soft with right upper quadrant tenderness nondistended Extremities: No pedal edema. Skin: No rashes, lesions or ulcers   Data  Reviewed:    Labs: Basic Metabolic Panel: Recent Labs  Lab 07/18/19 2151  NA 139  K 3.7  CL 100  CO2 28  GLUCOSE 163*  BUN 14  CREATININE 1.16*  CALCIUM 9.2   GFR CrCl cannot be calculated (Unknown ideal weight.). Liver Function Tests: Recent Labs  Lab 07/18/19 2151  AST 25  ALT 41  ALKPHOS 433*  BILITOT 0.9  PROT 7.7  ALBUMIN 3.5   Recent Labs  Lab 07/18/19 2151  LIPASE 14   No results for input(s): AMMONIA in the last 168 hours. Coagulation profile No results for input(s): INR, PROTIME in the last 168 hours. COVID-19 Labs  No results for input(s): DDIMER, FERRITIN, LDH, CRP in the last 72 hours.  Lab Results  Component Value Date   SARSCOV2NAA POSITIVE (A) 05/10/2019    CBC: Recent Labs  Lab 07/18/19 2151 07/19/19 0830  WBC 13.7* 15.1*  NEUTROABS  --  11.7*  HGB 11.4* 11.1*  HCT 35.3* 33.9*  MCV 85.7 85.0  PLT 471* 482*   Cardiac Enzymes: No results for input(s): CKTOTAL, CKMB, CKMBINDEX, TROPONINI in the last 168 hours. BNP (last 3 results) No results for input(s): PROBNP in the last 8760 hours. CBG: Recent Labs  Lab 07/19/19 1129 07/19/19 1642 07/19/19 2056 07/20/19 0130 07/20/19 0437  GLUCAP 169* 137* 154* 119* 129*   D-Dimer: No results for input(s): DDIMER in the last 72 hours. Hgb A1c: No results for input(s): HGBA1C in the last 72 hours. Lipid Profile: No results for input(s): CHOL, HDL, LDLCALC, TRIG, CHOLHDL, LDLDIRECT in the last 72 hours. Thyroid function studies: No results for input(s): TSH, T4TOTAL,  T3FREE, THYROIDAB in the last 72 hours.  Invalid input(s): FREET3 Anemia work up: No results for input(s): VITAMINB12, FOLATE, FERRITIN, TIBC, IRON, RETICCTPCT in the last 72 hours. Sepsis Labs: Recent Labs  Lab 07/18/19 2151 07/19/19 0830  WBC 13.7* 15.1*   Microbiology No results found for this or any previous visit (from the past 240 hour(s)).   Medications:    acetaminophen  1,000 mg Oral On Call to OR    Chlorhexidine Gluconate Cloth  6 each Topical Once   gabapentin  300 mg Oral On Call to OR   insulin aspart  0-9 Units Subcutaneous Q4H   metoprolol tartrate  75 mg Oral BID   sodium chloride flush  3 mL Intravenous Q12H   Continuous Infusions:  meropenem (MERREM) IV 1 g (07/20/19 0557)     LOS: 1 day   Charlynne Cousins  [facility]  07/20/2019, 7:48 AM

## 2019-07-20 NOTE — Consult Note (Addendum)
[place] Nurse Consult Note: Reason for Consult: Consult requested for sacrum/buttocks.  Pt is frequently incontinent of urine and has a Purewick in place to attempt to contain urine.  Lower bilat buttocks with patchy areas of red macerated partial thickness skin loss; appearance is consistent with moisture associated skin damage.  Wound type: Sacrum with stage 2 pressure injury; 2X2X.1cm, pink and moist Pressure Injury POA: Yes Dressing procedure/placement/frequency: Topical treatment orders provided for bedside nurses to perform daily as follows to protect skin; Foam dressing to buttocks/sacrum.  Change Q 3 days or PRN soiling. Called pt's sister to discuss wound appearance as requested but she did not answer; message left on her phone.  Please re-consult if further assistance is needed.  Thank-you,  Julien Girt MSN, [place], [place], [place], [place]

## 2019-07-20 NOTE — Progress Notes (Addendum)
Day of Surgery  Subjective: Feels ok today.  Hungry and ready to eat after surgery.  No  pain  ROS: See above, otherwise other systems negative  Objective: Vital signs in last 24 hours: Temp:  [98.2 F (36.8 C)-100.3 F (37.9 C)] 98.2 F (36.8 C) (01/18 0906) Pulse Rate:  [98-145] 98 (01/18 1224) Resp:  [18-20] 18 (01/18 1224) BP: (112-154)/(62-98) 119/63 (01/18 1224) SpO2:  [91 %-100 %] 98 % (01/18 1224) Last BM Date: 07/18/19  Intake/Output from previous day: 01/17 0701 - 01/18 0700 In: 1576.8 [I.V.:1376.8; IV Piggyback:200] Out: 500 [Urine:500] Intake/Output this shift: No intake/output data recorded.  PE: Heart: regular Lungs: CTAB Abd: soft, tender in RUQ, +BS, obese  Lab Results:  Recent Labs    07/18/19 2151 07/19/19 0830  WBC 13.7* 15.1*  HGB 11.4* 11.1*  HCT 35.3* 33.9*  PLT 471* 482*   BMET Recent Labs    07/18/19 2151 07/20/19 0818  NA 139 143  K 3.7 3.8  CL 100 108  CO2 28 27  GLUCOSE 163* 128*  BUN 14 13  CREATININE 1.16* 1.06*  CALCIUM 9.2 8.2*   PT/INR No results for input(s): LABPROT, INR in the last 72 hours. CMP     Component Value Date/Time   NA 143 07/20/2019 0818   NA 140 08/26/2018 0959   K 3.8 07/20/2019 0818   CL 108 07/20/2019 0818   CO2 27 07/20/2019 0818   GLUCOSE 128 (H) 07/20/2019 0818   BUN 13 07/20/2019 0818   BUN 10 08/26/2018 0959   CREATININE 1.06 (H) 07/20/2019 0818   CALCIUM 8.2 (L) 07/20/2019 0818   PROT 7.7 07/18/2019 2151   PROT 6.4 08/26/2018 0959   ALBUMIN 3.5 07/18/2019 2151   ALBUMIN 3.9 08/26/2018 0959   AST 25 07/18/2019 2151   ALT 41 07/18/2019 2151   ALKPHOS 433 (H) 07/18/2019 2151   BILITOT 0.9 07/18/2019 2151   BILITOT 0.2 08/26/2018 0959   GFRNONAA 59 (L) 07/20/2019 0818   GFRAA >60 07/20/2019 0818   Lipase     Component Value Date/Time   LIPASE 14 07/18/2019 2151       Studies/Results: ECHOCARDIOGRAM COMPLETE  Result Date: 07/19/2019   ECHOCARDIOGRAM LIMITED REPORT    Patient Name:   OFFIE LEINENBACH Date of Exam: 07/19/2019 Medical Rec #:  [id number]     Height:       65.0 in Accession #:    [id number]    Weight:       264.6 lb Date of Birth:  10-06-1960     BSA:          2.23 m Patient Age:    59 years      BP:           132/73 mmHg Patient Gender: F             HR:           102 bpm. Exam Location:  Inpatient  Procedure: 2D Echo STAT ECHO Indications:    chest pain 786.50  History:        Patient has prior history of Echocardiogram examinations, most                 recent 09/20/2017.  Sonographer:    Johny Chess Referring Phys: [id number] ABRAHAM FELIZ ORTIZ  Sonographer Comments: Patient is morbidly obese. IMPRESSIONS  1. Left ventricular ejection fraction, by visual estimation, is 40 to 45%. The left ventricle has mildly decreased function. There is  no increased left ventricular wall thickness.  2. Indeterminate diastolic filling due to E-A fusion.  3. The left ventricle demonstrates global hypokinesis.  4. The right ventricle has normal systolic function.The right ventricular size is normal.  5. The mitral valve is normal in structure. Mild mitral valve regurgitation.  6. The tricuspid valve was normal in structure.  7. Tricuspid valve regurgitation is mild-moderate.  8. Moderately elevated pulmonary artery systolic pressure.  9. Compared to 2016 and 2019 reports, LV systolic function has deteriorated. Side by side comparison was not available. FINDINGS  Left Ventricle: Left ventricular ejection fraction, by visual estimation, is 40 to 45%. The left ventricle has mildly decreased function. The left ventricle demonstrates global hypokinesis. The left ventricular internal cavity size was the left ventricle is normal in size. There is no increased left ventricular wall thickness. Indeterminate diastolic filling due to E-A fusion. Right Ventricle: The right ventricular size is normal. No increase in right ventricular wall thickness. Global RV systolic function is has normal systolic  function. The tricuspid regurgitant velocity is 3.31 m/s, and with an assumed right atrial pressure  of 10 mmHg, the estimated right ventricular systolic pressure is moderately elevated at 53.8 mmHg. Left Atrium: Left atrial size was normal. Right Atrium: Right atrial size was normal. Right atrial pressure is estimated at 10 mmHg. Pericardium: There is no evidence of pericardial effusion is seen. There is no evidence of pericardial effusion. Mitral Valve: The mitral valve is normal in structure. Mild mitral valve regurgitation. Tricuspid Valve: The tricuspid valve is normal in structure. Tricuspid valve regurgitation mild-moderate. Aortic Valve: The aortic valve is normal in structure. Aortic valve regurgitation is mild. Pulmonic Valve: The pulmonic valve was not well visualized. Pulmonic valve regurgitation is not visualized by color flow Doppler. Pulmonic regurgitation is not visualized by color flow Doppler. Aorta: The aortic root, ascending aorta and aortic arch are all structurally normal, with no evidence of dilitation or obstruction. Shunts: No atrial level shunt detected by color flow Doppler.  LEFT VENTRICLE PLAX 2D LVIDd:         5.05 cm LVIDs:         3.98 cm LV PW:         0.90 cm LV IVS:        0.90 cm LV SV:         52 ml LV SV Index:   21.50  RIGHT VENTRICLE RV S prime:     12.70 cm/s TAPSE (M-mode): 1.2 cm LEFT ATRIUM             Index       RIGHT ATRIUM           Index LA diam:        3.50 cm 1.57 cm/m  RA Area:     13.10 cm LA Vol (A2C):   26.4 ml 11.85 ml/m RA Volume:   33.30 ml  14.95 ml/m LA Vol (A4C):   38.0 ml 17.06 ml/m LA Biplane Vol: 33.5 ml 15.04 ml/m  AORTIC VALVE LVOT Vmax:   89.40 cm/s LVOT Vmean:  61.400 cm/s LVOT VTI:    0.133 m  AORTA Ao Root diam: 2.70 cm TRICUSPID VALVE TR Peak grad:   43.8 mmHg TR Vmax:        331.00 cm/s  SHUNTS Systemic VTI: 0.13 m  Dani Gobble Croitoru MD Electronically signed by Sanda Klein MD Signature Date/Time: 07/19/2019/7:02:08 PM    Final    US  Abdomen Limited RUQ  Result Date:  07/19/2019 CLINICAL DATA:  Right upper quadrant pain. Gallstones on recent CT. Elevated white blood cell count. EXAM: ULTRASOUND ABDOMEN LIMITED RIGHT UPPER QUADRANT COMPARISON:  Abdominal CT 06/29/2019 FINDINGS: Gallbladder: Distended with multiple intraluminal gallstones. Mild gallbladder wall thickening of 3-4 mm. No pericholecystic fluid. Positive sonographic Murphy sign noted by sonographer. Common bile duct: Diameter: 3 mm, normal. Liver: Heterogeneously increased in parenchymal echogenicity. The liver parenchyma is difficult to penetrate. No evidence of focal lesion. Portal vein is patent on color Doppler imaging with normal direction of blood flow towards the liver. Other: None. IMPRESSION: 1. Gallstones with mild gallbladder wall thickening and positive sonographic Murphy sign. Findings are consistent with acute cholecystitis in the appropriate clinical setting. 2. No biliary dilatation. 3. Hepatic steatosis. Electronically Signed   By: Keith Rake M.D.   On: 07/19/2019 00:50    Anti-infectives: Anti-infectives (From admission, onward)   Start     Dose/Rate Route Frequency Ordered Stop   07/20/19 1245  meropenem (MERREM) 1 g in sodium chloride 0.9 % 100 mL IVPB     1 g 200 mL/hr over 30 Minutes Intravenous  Once 07/20/19 1243     07/19/19 0415  [MAR Hold]  meropenem (MERREM) 1 g in sodium chloride 0.9 % 100 mL IVPB     (MAR Hold since Mon 07/20/2019 at 1205.Hold Reason: Transfer to a Procedural area.)   1 g 200 mL/hr over 30 Minutes Intravenous Every 8 hours 07/19/19 0409     07/19/19 0200  ciprofloxacin (CIPRO) IVPB 400 mg     400 mg 200 mL/hr over 60 Minutes Intravenous  Once 07/19/19 0146 07/19/19 0447       Assessment/Plan MS with paraplegia HTN bedbound Chronic systolic heart failure 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  Surgery has already been discussed with her and she has signed her consent for today  FEN - npo VTE - heparin ID - merrem   LOS: 1 day    [name] , [facility] Surgery 07/20/2019, 12:52 PM Please see Amion for pager number during day hours 7:00am-4:30pm or 7:00am -11:30am on weekends

## 2019-07-20 NOTE — Consult Note (Addendum)
Cardiology Consultation:   Patient ID: Shannon Garrett MRN: [id number]; DOB: [birth year]  Admit date: 07/18/2019 Date of Consult: 07/20/2019  Primary Care Provider: Gildardo Cranker, DO Primary Cardiologist: Peter Martinique, MD   Patient Profile:   Shannon Garrett is a 59 y.o. female with a hx of multiple sclerosis (bed to wheelchair bound), HTN, prior nonischemic cardiomyopathy (EF of 20-25% in 2012) with improved LVEF, recurrent UTI, SNF resident and depression who is being seen today for the evaluation of tachycardia and surgical clearance at the request of Dr. Aileen Fass.   Echo in 12/2010 demonstrated normal LVF. She had been treated with Novantrone, Tysabri, Copaxone, Symmetrel and Dimethyl Fumerate for her MS. She was admitted 04/2012 with new onset systolic CHF. Echo 04/29/12: Mild LVH, EF 20-25%, mild AI, moderate MR, moderate LAE, mild RAE, mild RVE, moderate TR, PASP 51, small pericardial effusion. Overall it has been felt that her cardiomyopathy is likely nonischemic. She has been exposed to multiple chemotherapeutic agents in the past which can cause CHF months to years after discontinuation. Medical therapy was recommended for CHF. Ischemic evaluation was not felt to be necessary. Her EF has improved with med Rx. Echo 3/14: mild LVH, EF 50-93%, grade 1 diastolic dysfunction, mild AI, MAC, mild LAE. Echo in July 2016 was normal. LVEF was 55-60% in 08/2017 when last seen by Dr. Martinique.   Most recently she was admitted 05/2019 for  Covid 19 pneumonia and Klebsiella ESBL UTI. She was discharged to SNF and has had C. Diff which was treated with vancomycin.   History of Present Illness:   Shannon Garrett presented 07/18/19 with 1 day hx of right-sided abdominal pain, nausea, and anorexia. Admitted for sepsis 2nd to acute calculus cholecystitis. Started on fluids and meropenem. Plan for laparoscopic cholecystectomy. However yesterday patient became tachycardic which resolved with PRN IV metoprolol>>then  po (uptirated) and cardizem gtt (now off). Rhythm looks like sinus tachycardia with rate related LBBB. Echo showed reduced LVEF to 40-45% from prior, intermediate DD and moderate pulmonary hypertension.   Patient reports intermittent tachycardia at facility up to 130s.  She never felt palpitation.  This been ongoing since 2019.  She denies chest pain, palpitation, dizziness or syncope.  Intermittent shortness of breath.  Home medication listed both Coreg 25 mg twice daily and metoprolol 24 5 mg twice daily.  Heart Pathway Score:     Past Medical History:  Diagnosis Date  . Cardiomyopathy ([place])    a.  Echo 04/29/12: Mild LVH, EF 20-25%, mild AI, moderate MR, moderate LAE, mild RAE, mild RVE, moderate TR, PASP 51, small pericardial effusion;   b. probably non-ischemic given multiple chemo-Tx agents used for MS and global LV dysfn on echo  . Chronic systolic heart failure ([place])   . COVID-19 05/10/2019   Per facility paperwork  . Depression   . Glaucoma   . Hypertension   . MS (multiple sclerosis) ([place])    a. Dx'd late 20's. b. Tx with Novantrone, Tysabri, Copaxone previously.    Past Surgical History:  Procedure Laterality Date  . ABLATION     uterine  . CESAREAN SECTION      Inpatient Medications: Scheduled Meds: . acetaminophen  1,000 mg Oral On Call to OR  . Chlorhexidine Gluconate Cloth  6 each Topical Once  . diltiazem  15 mg Intravenous Once  . gabapentin  300 mg Oral On Call to OR  . insulin aspart  0-9 Units Subcutaneous Q4H  . metoprolol tartrate  75 mg Oral BID  .  sodium chloride flush  3 mL Intravenous Q12H   Continuous Infusions: . dextrose 5 % and 0.45 % NaCl with KCl 20 mEq/L    . diltiazem (CARDIZEM) infusion    . meropenem (MERREM) IV 1 g (07/20/19 0557)   PRN Meds: acetaminophen **OR** acetaminophen, HYDROmorphone (DILAUDID) injection, lip balm, metoprolol tartrate, ondansetron **OR** ondansetron (ZOFRAN) IV  Allergies:    Allergies  Allergen Reactions    . Sulfonamide Derivatives Hives and Shortness Of Breath  . Penicillins Hives and Itching    Has patient had a PCN reaction causing immediate rash, facial/tongue/throat swelling, SOB or lightheadedness with hypotension: unknown Has patient had a PCN reaction causing severe rash involving mucus membranes or skin necrosis: unknown Has patient had a PCN reaction that required hospitalization: unknown Has patient had a PCN reaction occurring within the last 10 years: unknown If all of the above answers are "NO", then may proceed with Cephalosporin use. Prior course of rocephin charted 05/2015    Social History:   Social History   Socioeconomic History  . Marital status: Single    Spouse name: Not on file  . Number of children: Not on file  . Years of education: Not on file  . Highest education level: Not on file  Occupational History  . Not on file  Tobacco Use  . Smoking status: Former Smoker    Packs/day: 0.50    Years: 5.00    Pack years: 2.50  . Smokeless tobacco: Never Used  Substance and Sexual Activity  . Alcohol use: No  . Drug use: No  . Sexual activity: Not Currently  Other Topics Concern  . Not on file  Social History Narrative  . Not on file   Social Determinants of Health   Financial Resource Strain:   . Difficulty of Paying Living Expenses: Not on file  Food Insecurity:   . Worried About Charity fundraiser in the Last Year: Not on file  . Ran Out of Food in the Last Year: Not on file  Transportation Needs:   . Lack of Transportation (Medical): Not on file  . Lack of Transportation (Non-Medical): Not on file  Physical Activity:   . Days of Exercise per Week: Not on file  . Minutes of Exercise per Session: Not on file  Stress:   . Feeling of Stress : Not on file  Social Connections:   . Frequency of Communication with Friends and Family: Not on file  . Frequency of Social Gatherings with Friends and Family: Not on file  . Attends Religious Services: Not  on file  . Active Member of Clubs or Organizations: Not on file  . Attends Archivist Meetings: Not on file  . Marital Status: Not on file  Intimate Partner Violence:   . Fear of Current or Ex-Partner: Not on file  . Emotionally Abused: Not on file  . Physically Abused: Not on file  . Sexually Abused: Not on file    Family History:    Family History  Problem Relation Age of Onset  . Hypertension Mother   . Cancer Mother        breast   . Cancer Father        prostate  . Multiple sclerosis Sister   . Heart attack Neg Hx      ROS:  Please see the history of present illness.  All other ROS reviewed and negative.     Physical Exam/Data:   Vitals:   07/19/19 1432  07/19/19 1758 07/20/19 0205 07/20/19 0441  BP: (!) 153/98 132/73 122/62 122/70  Pulse: (!) 124 (!) 103 (!) 111 (!) 115  Resp: _0 Temp: 98.3 F (36.8 C) 98.8 F (37.1 C) 99.8 F (37.7 C) 98.4 F (36.9 C)  TempSrc: Oral Oral Oral Oral  SpO2: 96% 95% 97% 100%    Intake/Output Summary (Last 24 hours) at 07/20/2019 0827 Last data filed at 07/20/2019 0200 Gross per 24 hour  Intake 1576.75 ml  Output 500 ml  Net 1076.75 ml   Last 3 Weights 05/10/2019 04/21/2018 02/24/2018  Weight (lbs) 264 lb 8.8 oz 263 lb 6.4 oz 273 lb  Weight (kg) 120 kg 119.477 kg 123.832 kg     There is no height or weight on file to calculate BMI.  General: Pleasant obese female in no acute distress HEENT: normal Lymph: no adenopathy Neck: no JVD Endocrine:  No thryomegaly Vascular: No carotid bruits; FA pulses 2+ bilaterally without bruits  Cardiac:  normal S1, S2; RRR; no murmur  Lungs:  clear to auscultation bilaterally, no wheezing, rhonchi or rales  Abd: Diffuse tender to palpation Ext: no edema Musculoskeletal:  No deformities, BUE and BLE strength normal and equal Skin: warm and dry  Neuro:  CNs 2-12 intact, no focal abnormalities noted Psych:  Normal affect   EKG:  The EKG 07/20/19  was personally reviewed  and demonstrates: Sinus tachycardia at rate of 110 bpm Telemetry:  Telemetry was personally reviewed and demonstrates: Sinus rhythm/sinus tachycardia rate 90-130s, intermittent left bundle branch block  Relevant CV Studies:  Echo 07/19/19 1. Left ventricular ejection fraction, by visual estimation, is 40 to 45%. The left ventricle has mildly decreased function. There is no increased left ventricular wall thickness.  2. Indeterminate diastolic filling due to E-A fusion.  3. The left ventricle demonstrates global hypokinesis.  4. The right ventricle has normal systolic function.The right ventricular size is normal.  5. The mitral valve is normal in structure. Mild mitral valve regurgitation.  6. The tricuspid valve was normal in structure.  7. Tricuspid valve regurgitation is mild-moderate.  8. Moderately elevated pulmonary artery systolic pressure.  9. Compared to 2016 and 2019 reports, LV systolic function has deteriorated. Side by side comparison was not available.  Laboratory Data:  Chemistry Recent Labs  Lab 07/18/19 2151  NA 139  K 3.7  CL 100  CO2 28  GLUCOSE 163*  BUN 14  CREATININE 1.16*  CALCIUM 9.2  GFRNONAA 53*  GFRAA >60  ANIONGAP 11    Recent Labs  Lab 07/18/19 2151  PROT 7.7  ALBUMIN 3.5  AST 25  ALT 41  ALKPHOS 433*  BILITOT 0.9   Hematology Recent Labs  Lab 07/18/19 2151 07/19/19 0830  WBC 13.7* 15.1*  RBC 4.12 3.99  HGB 11.4* 11.1*  HCT 35.3* 33.9*  MCV 85.7 85.0  MCH 27.7 27.8  MCHC 32.3 32.7  RDW 16.7* 16.7*  PLT 471* 482*    Radiology/Studies:  ECHOCARDIOGRAM COMPLETE  Result Date: 07/19/2019   ECHOCARDIOGRAM LIMITED REPORT   Patient Name:   Shannon Garrett Date of Exam: 07/19/2019 Medical Rec #:  [id number]     Height:       65.0 in Accession #:    [id number]    Weight:       264.6 lb Date of Birth:  [birth year]     BSA:          2.23 m Patient Age:    55 years  BP:           132/73 mmHg Patient Gender: F             HR:           102  bpm. Exam Location:  Inpatient  Procedure: 2D Echo STAT ECHO Indications:    chest pain 786.50  History:        Patient has prior history of Echocardiogram examinations, most                 recent 09/20/2017.  Sonographer:    Johny Chess Referring Phys: [id number] ABRAHAM FELIZ ORTIZ  Sonographer Comments: Patient is morbidly obese. IMPRESSIONS  1. Left ventricular ejection fraction, by visual estimation, is 40 to 45%. The left ventricle has mildly decreased function. There is no increased left ventricular wall thickness.  2. Indeterminate diastolic filling due to E-A fusion.  3. The left ventricle demonstrates global hypokinesis.  4. The right ventricle has normal systolic function.The right ventricular size is normal.  5. The mitral valve is normal in structure. Mild mitral valve regurgitation.  6. The tricuspid valve was normal in structure.  7. Tricuspid valve regurgitation is mild-moderate.  8. Moderately elevated pulmonary artery systolic pressure.  9. Compared to 2016 and 2019 reports, LV systolic function has deteriorated. Side by side comparison was not available. FINDINGS  Left Ventricle: Left ventricular ejection fraction, by visual estimation, is 40 to 45%. The left ventricle has mildly decreased function. The left ventricle demonstrates global hypokinesis. The left ventricular internal cavity size was the left ventricle is normal in size. There is no increased left ventricular wall thickness. Indeterminate diastolic filling due to E-A fusion. Right Ventricle: The right ventricular size is normal. No increase in right ventricular wall thickness. Global RV systolic function is has normal systolic function. The tricuspid regurgitant velocity is 3.31 m/s, and with an assumed right atrial pressure  of 10 mmHg, the estimated right ventricular systolic pressure is moderately elevated at 53.8 mmHg. Left Atrium: Left atrial size was normal. Right Atrium: Right atrial size was normal. Right atrial pressure is  estimated at 10 mmHg. Pericardium: There is no evidence of pericardial effusion is seen. There is no evidence of pericardial effusion. Mitral Valve: The mitral valve is normal in structure. Mild mitral valve regurgitation. Tricuspid Valve: The tricuspid valve is normal in structure. Tricuspid valve regurgitation mild-moderate. Aortic Valve: The aortic valve is normal in structure. Aortic valve regurgitation is mild. Pulmonic Valve: The pulmonic valve was not well visualized. Pulmonic valve regurgitation is not visualized by color flow Doppler. Pulmonic regurgitation is not visualized by color flow Doppler. Aorta: The aortic root, ascending aorta and aortic arch are all structurally normal, with no evidence of dilitation or obstruction. Shunts: No atrial level shunt detected by color flow Doppler.  LEFT VENTRICLE PLAX 2D LVIDd:         5.05 cm LVIDs:         3.98 cm LV PW:         0.90 cm LV IVS:        0.90 cm LV SV:         52 ml LV SV Index:   21.50  RIGHT VENTRICLE RV S prime:     12.70 cm/s TAPSE (M-mode): 1.2 cm LEFT ATRIUM             Index       RIGHT ATRIUM           Index LA diam:  3.50 cm 1.57 cm/m  RA Area:     13.10 cm LA Vol (A2C):   26.4 ml 11.85 ml/m RA Volume:   33.30 ml  14.95 ml/m LA Vol (A4C):   38.0 ml 17.06 ml/m LA Biplane Vol: 33.5 ml 15.04 ml/m  AORTIC VALVE LVOT Vmax:   89.40 cm/s LVOT Vmean:  61.400 cm/s LVOT VTI:    0.133 m  AORTA Ao Root diam: 2.70 cm TRICUSPID VALVE TR Peak grad:   43.8 mmHg TR Vmax:        331.00 cm/s  SHUNTS Systemic VTI: 0.13 m  Sanda Klein MD Electronically signed by Sanda Klein MD Signature Date/Time: 07/19/2019/7:02:08 PM    Final    US Abdomen Limited RUQ  Result Date: 07/19/2019 CLINICAL DATA:  Right upper quadrant pain. Gallstones on recent CT. Elevated white blood cell count. EXAM: ULTRASOUND ABDOMEN LIMITED RIGHT UPPER QUADRANT COMPARISON:  Abdominal CT 06/29/2019 FINDINGS: Gallbladder: Distended with multiple intraluminal gallstones. Mild  gallbladder wall thickening of 3-4 mm. No pericholecystic fluid. Positive sonographic Murphy sign noted by sonographer. Common bile duct: Diameter: 3 mm, normal. Liver: Heterogeneously increased in parenchymal echogenicity. The liver parenchyma is difficult to penetrate. No evidence of focal lesion. Portal vein is patent on color Doppler imaging with normal direction of blood flow towards the liver. Other: None. IMPRESSION: 1. Gallstones with mild gallbladder wall thickening and positive sonographic Murphy sign. Findings are consistent with acute cholecystitis in the appropriate clinical setting. 2. No biliary dilatation. 3. Hepatic steatosis. Electronically Signed   By: Keith Rake M.D.   On: 07/19/2019 00:50     Assessment and Plan:   1. 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 -Echocardiogram in 2014, 2016 and 2019 showed normal LV function. -Echocardiogram this admission showed LV function of 40 to 45%.  See below. -Home Coreg 25 mg twice daily, spironolactone 25 mg daily and torsemide 20 mg daily on hold -Watch for volume overload with hydration  2.  Tachycardia with rate related left bundle branch block -Patient reports intermittent tachycardia up to 130s for greater than 1 year. Her cardiomyopathy could be rate related.  Previously her cardiomyopathy felt nonischemic due to chemotherapy.  Certainly she could have underlying CAD given left bundle branch block.  LBBB is rate related when heart rate above 130 bpm.  Discontinued IV Cardizem. -Currently rate control at 100-110 on Lopressor 75 mg twice daily. -Patient does not feel palpitation with tachycardia however reports intermittent shortness of breath at facility, none yesterday.  3.  Surgical clearance for acute calculus cholecystitis -Plan for OR today at 1:30 PM.  Patient is wheelchair-bound thus  unable to determine functional status. As above. RCRI is 10.1%.   Dr. Debara Pickett to see this morning.   For questions or updates, please contact [place] Please consult [email or web address] for contact info under     Jarrett Soho, PA  07/20/2019 8:27 AM

## 2019-07-20 NOTE — Anesthesia Procedure Notes (Signed)
Procedure Name: Intubation Date/Time: 07/20/2019 1:01 PM Performed by: Sharlette Dense, CRNA Patient Re-evaluated:Patient Re-evaluated prior to induction Oxygen Delivery Method: Circle system utilized Preoxygenation: Pre-oxygenation with 100% oxygen Induction Type: IV induction Ventilation: Mask ventilation without difficulty and Oral airway inserted - appropriate to patient size Laryngoscope Size: Glidescope and 4 Grade View: Grade I Tube type: Oral Tube size: 7.5 mm Number of attempts: 1 Airway Equipment and Method: Stylet Placement Confirmation: ETT inserted through vocal cords under direct vision,  positive ETCO2 and breath sounds checked- equal and bilateral Secured at: 22 cm Tube secured with: Tape Dental Injury: Teeth and Oropharynx as per pre-operative assessment

## 2019-07-20 NOTE — Op Note (Signed)
07/20/2019  1:49 PM  PATIENT:  Shannon Garrett  59 y.o. female  PRE-OPERATIVE DIAGNOSIS:  Calculous cholecystitis  POST-OPERATIVE DIAGNOSIS:  Severe calculous cholecystitis  PROCEDURE:  Procedure(s): DIAGNOSTIC LAPAROSCOPY  SURGEON:  Surgeon(s): [name], MD   ASSISTANTS: Johnathan Hausen, MD   ANESTHESIA:   general  DRAINS: none   LOCAL MEDICATIONS USED:  MARCAINE     SPECIMEN:  No Specimen  DISPOSITION OF SPECIMEN:  N/A  COUNTS:  YES  INDICATION FOR PROCEDURE: 59 year old female admitted on January 17 with a several day history of abdominal pain he was found to have cholecystitis based on labs and ultrasound imaging.  She has longstanding multiple sclerosis and is essentially paraplegic.  She also has diabetes mellitus and cardiac dysfunction.  She was evaluated and ultimately cleared for surgery by cardiology although felt to be at increased risk.  I saw the patient preoperatively and discussed the risk and benefits of surgery which are separately documented.  PROCEDURE: Patient received 5000 units of subcutaneous heparin in preop holding.  She was then taken to the OR for at [facility] after informed consent was obtained.  She was placed supine on the operating room table and general endotracheal anesthesia was established.  Sequential compression devices were placed.  Her abdomen was prepped and draped in the usual standard surgical fashion.  She was on broad-spectrum scheduled antibiotics.  A surgical timeout was performed.  I made a small supraumbilical incision with a #11 blade.  The fascia was grasped and lifted anteriorly and the fascia was incised.  Her fascia was very attenuated.  A pursestring suture was placed around the fascial edges with 0 Vicryl and a 12 mm on trocar was placed.  Pneumoperitoneum was established up to the patient pressure of 15 mmHg without any change in patient vital signs.  Patient was placed in reverse Trendelenburg and rotated to the  left.  A 5 mm trocar was placed in the subxiphoid position under direct visualization.  I could palpate her gallbladder on exam.  She had dense inflammation in the right upper quadrant.  The omentum was densely adhered to a large firm mass in the right upper quadrant consistent with the gallbladder location.  I could not peel the omentum off of structures.  I could barely identify the right lobe of the liver.  Since I could not even peel off the omentum to visualize the gallbladder & if felt like she had a severely inflamed, discussed with my assistant Dr. Hassell Done whether or not we should proceed.  We both felt that given the severe inflammation in the right upper quadrant and the fact that we cannot usually visualize the gallbladder she we should not proceed.  Also given how severe the inflammation was in the right upper quadrant I did not feel it was prudent for me to attempt to place a cholecystostomy tube placed.  We decided to stop the procedure and send the patient to interventional radiology for percutaneous cholecystostomy tube on Tuesday.  The peritoneum was released.  Trochars were removed.  Skin incisions were closed with a 4 Monocryl followed by application of Dermabond.  Patient was extubated and taken recovery in stable condition  PLAN OF CARE: Admit to inpatient   PATIENT DISPOSITION:  PACU - hemodynamically stable.   Delay start of Pharmacological VTE agent (>24hrs) due to surgical blood loss or risk of bleeding:  no  Leighton Ruff. Redmond Pulling, MD, FACS General, Bariatric, & Minimally Invasive Surgery [facility], [place]

## 2019-07-20 NOTE — Progress Notes (Signed)
Pt request to call Dr. And ask for diflucan she is itching in the vaginal area and the powder we use is not helping. Notified [facility]1 of request.

## 2019-07-20 NOTE — Consult Note (Addendum)
Chief Complaint: Patient was seen in consultation today for percutaneous cholecystostomy Chief Complaint  Patient presents with  . Abdominal Pain    Referring Physician(s): [place]  Supervising Physician: Daryll Brod  Patient Status: [facility] - In-pt  History of Present Illness: Shannon Garrett is a 59 y.o. female with history of cardiomyopathy, heart failure, COVID-19 in November of last year, depression, hypertension and MS with paraplegia/bedbound.  She has a history of severe calculus cholecystitis and was scheduled for cholecystectomy today but procedure had to be aborted due to gallbladder being stuck to omentum.  Request now received for percutaneous cholecystostomy.  Past Medical History:  Diagnosis Date  . Cardiomyopathy ([place])    a.  Echo 04/29/12: Mild LVH, EF 20-25%, mild AI, moderate MR, moderate LAE, mild RAE, mild RVE, moderate TR, PASP 51, small pericardial effusion;   b. probably non-ischemic given multiple chemo-Tx agents used for MS and global LV dysfn on echo  . Chronic systolic heart failure ([place])   . COVID-19 05/10/2019   Per facility paperwork  . Depression   . Glaucoma   . Hypertension   . MS (multiple sclerosis) ([place])    a. Dx'd late 20's. b. Tx with Novantrone, Tysabri, Copaxone previously.    Past Surgical History:  Procedure Laterality Date  . ABLATION     uterine  . CESAREAN SECTION      Allergies: Sulfonamide derivatives and Penicillins  Medications: Prior to Admission medications   Medication Sig Start Date End Date Taking? Authorizing Provider  acetaminophen (TYLENOL) 325 MG tablet Take 650 mg by mouth every 8 (eight) hours as needed for mild pain, moderate pain or headache.    Yes [provider]  albuterol (PROVENTIL) (2.5 MG/3ML) 0.083% nebulizer solution Take 3 mLs (2.5 mg total) by nebulization every 2 (two) hours as needed for wheezing. 11/10/15  Yes Regalado, Belkys A, MD  albuterol (VENTOLIN HFA) 108 (90 Base) MCG/ACT  inhaler Inhale 2 puffs into the lungs every 6 (six) hours as needed for wheezing or shortness of breath.  07/10/19  Yes [provider]  amantadine (SYMMETREL) 100 MG capsule Take 100 mg by mouth daily.    Yes [provider]  Amino Acids-Protein Hydrolys (FEEDING SUPPLEMENT, PRO-STAT SUGAR FREE 64,) LIQD Take 30 mLs by mouth 3 (three) times daily with meals.   Yes [provider]  ascorbic acid (VITAMIN C) 500 MG tablet Take 500 mg by mouth daily.    Yes [provider]  aspirin 325 MG EC tablet Take 325 mg by mouth daily.   Yes [provider]  Baclofen 5 MG TABS Take 5 mg by mouth 4 (four) times daily. Hold if lethargic or confused  12/04/17  Yes [provider]  buPROPion (WELLBUTRIN XL) 150 MG 24 hr tablet Take 150 mg by mouth daily after breakfast.   Yes [provider]  carvedilol (COREG) 25 MG tablet Take 1 tablet (25 mg total) by mouth 2 (two) times daily with a meal. 07/25/15  Yes Rai, Ripudeep K, MD  CRANBERRY PO Take 100 mg by mouth at bedtime.    Yes [provider]  DULoxetine (CYMBALTA) 30 MG capsule Take 90 mg by mouth daily. 07/18/19  Yes [provider]  Ensure (ENSURE) Take 237 mLs by mouth 2 (two) times daily between meals.   Yes [provider]  famotidine (PEPCID) 20 MG tablet Take 1 tablet (20 mg total) by mouth 2 (two) times daily. 07/25/15  Yes Rai, Vernelle Emerald, MD  ferrous sulfate 325 (65 FE) MG tablet Take 325 mg by mouth 2 (two) times daily with a meal.  02/20/18  Yes [provider]  fluticasone (FLONASE) 50 MCG/ACT nasal spray Place 2 sprays into both nostrils every evening.   Yes [provider]  gabapentin (NEURONTIN) 800 MG tablet Take 800 mg by mouth 3 (three) times daily.   Yes [provider]  insulin aspart (NOVOLOG FLEXPEN) 100 UNIT/ML FlexPen 0-15 Units, Subcutaneous, 3 times daily with meals CBG < 70: implement hypoglycemia protocol-call MD CBG 70 - 120:  0 units CBG 121 - 150: 2 units CBG 151 - 200: 3 units CBG 201 - 250: 5 units CBG 251 - 300: 8 units CBG 301 - 350: 11 units CBG 351 - 400: 15 units CBG > 400: 05/18/19  Yes Ghimire, Henreitta Leber, MD  insulin glargine (LANTUS) 100 UNIT/ML injection Inject 0.1 mLs (10 Units total) into the skin daily. 05/19/19  Yes Ghimire, Henreitta Leber, MD  Lactobacillus (PROBIOTIC ACIDOPHILUS) CAPS Take 1 capsule by mouth daily.    Yes [provider]  latanoprost (XALATAN) 0.005 % ophthalmic solution Place 1 drop into both eyes at bedtime.   Yes [provider]  lubiprostone (AMITIZA) 24 MCG capsule Take 24 mcg by mouth 2 (two) times daily with a meal.   Yes [provider]  Melatonin 5 MG TABS Take 10 mg by mouth at bedtime.  11/14/17  Yes [provider]  metoprolol tartrate (LOPRESSOR) 25 MG tablet Take 12.5 mg by mouth 2 (two) times daily.   Yes [provider]  Multiple Vitamins-Minerals (MULTIVITAMIN ADULT PO) Take 1 tablet by mouth daily.   Yes [provider]  omeprazole (PRILOSEC) 20 MG capsule Take 20 mg by mouth daily.   Yes [provider]  ondansetron (ZOFRAN) 4 MG tablet Take 4 mg by mouth every 6 (six) hours as needed for nausea or vomiting. 11/01/17  Yes [provider]  potassium chloride (KLOR-CON) 20 MEQ packet Take 20 mEq by mouth daily.    Yes [provider]  senna-docusate (SENOKOT S) 8.6-50 MG tablet Take 2 tablets by mouth 2 (two) times daily.    Yes [provider]  sodium phosphate Pediatric (FLEET) 3.5-9.5 GM/59ML enema Place 1 enema rectally once as needed for mild constipation, moderate constipation or severe constipation.   Yes [provider]  spironolactone (ALDACTONE) 25 MG tablet Take 25 mg by mouth daily. For CHF   Yes [provider]  tamsulosin (FLOMAX) 0.4 MG CAPS capsule Take 0.4 mg by mouth daily. For incontinence   Yes [provider]  torsemide (DEMADEX) 20 MG  tablet Take 20 mg by mouth daily.    Yes [provider]  traMADol (ULTRAM) 50 MG tablet Take 1 tablet (50 mg total) by mouth every 8 (eight) hours as needed for moderate pain or severe pain. 05/18/19  Yes Ghimire, Henreitta Leber, MD  predniSONE (DELTASONE) 10 MG tablet Take 40 mg daily for 1 day, 30 mg daily for 1 day, 20 mg daily for 1 days,10 mg daily for 1 day, then stop Patient not taking: Reported on 07/20/2019 05/18/19   Jonetta Osgood, MD     Family History  Problem Relation Age of Onset  . Hypertension Mother   . Cancer Mother        breast   . Cancer Father        prostate  . Multiple sclerosis Sister   . Heart attack Neg Hx  Social History   Socioeconomic History  . Marital status: Single    Spouse name: Not on file  . Number of children: Not on file  . Years of education: Not on file  . Highest education level: Not on file  Occupational History  . Not on file  Tobacco Use  . Smoking status: Former Smoker    Packs/day: 0.50    Years: 5.00    Pack years: 2.50  . Smokeless tobacco: Never Used  Substance and Sexual Activity  . Alcohol use: No  . Drug use: No  . Sexual activity: Not Currently  Other Topics Concern  . Not on file  Social History Narrative  . Not on file   Social Determinants of Health   Financial Resource Strain:   . Difficulty of Paying Living Expenses: Not on file  Food Insecurity:   . Worried About Charity fundraiser in the Last Year: Not on file  . Ran Out of Food in the Last Year: Not on file  Transportation Needs:   . Lack of Transportation (Medical): Not on file  . Lack of Transportation (Non-Medical): Not on file  Physical Activity:   . Days of Exercise per Week: Not on file  . Minutes of Exercise per Session: Not on file  Stress:   . Feeling of Stress : Not on file  Social Connections:   . Frequency of Communication with Friends and Family: Not on file  . Frequency of Social Gatherings with Friends and Family: Not  on file  . Attends Religious Services: Not on file  . Active Member of Clubs or Organizations: Not on file  . Attends Archivist Meetings: Not on file  . Marital Status: Not on file    ECOG Status:   Review of Systems currently denies fever, headache, chest pain, worsening dyspnea, cough, back pain, nausea, vomiting or bleeding.  Does have right upper quadrant discomfort  Vital Signs: BP (!) 145/79 (BP Location: Right Arm)   Pulse 94   Temp 97.8 F (36.6 C) (Oral)   Resp 14   SpO2 95%   Physical Exam awake, alert.  Chest clear to auscultation bilaterally.  Heart with regular rate and rhythm.  Abdomen soft, tender right upper quadrant to palpation, obese, positive bowel sounds.  Imaging: CT Abdomen Pelvis W Contrast  Result Date: 06/29/2019 CLINICAL DATA:  Right-sided abdominal pain and recent C difficile infection EXAM: CT ABDOMEN AND PELVIS WITH CONTRAST TECHNIQUE: Multidetector CT imaging of the abdomen and pelvis was performed using the standard protocol following bolus administration of intravenous contrast. CONTRAST:  133mL OMNIPAQUE IOHEXOL 300 MG/ML  SOLN COMPARISON:  05/25/2013 FINDINGS: Lower chest: Mild scarring is noted in the left lung base. Stable nodule in the left base unchanged from the prior exam. Hepatobiliary: Mild fatty infiltration of the liver is noted. The gallbladder is well distended. Few small gallstones are noted within the gallbladder stable from the previous exam. Pancreas: Unremarkable. No pancreatic ductal dilatation or surrounding inflammatory changes. Spleen: Normal in size without focal abnormality. Adrenals/Urinary Tract: Adrenal glands are within normal limits bilaterally. Scattered cysts are seen within the kidneys bilaterally. Normal excretion of contrast is noted. No obstructive changes are seen. The bladder is well distended. Stomach/Bowel: Appendix is well visualized within normal limits. Scattered minimal diverticular change of the colon  is noted without evidence of diverticulitis. No small bowel abnormality is seen. The stomach is decompressed. Vascular/Lymphatic: Aortic atherosclerosis. No enlarged abdominal or pelvic lymph nodes. Reproductive: Calcified uterine  fibroid is noted stable from the prior exam. Uterus is otherwise within normal limits. No adnexal mass is seen. Other: No free fluid is noted.  No herniation is seen. Musculoskeletal: Degenerative changes of the lumbar spine are seen. Mild degenerative anterolisthesis of L4 on L5 is noted. This is stable from the prior exam. IMPRESSION: Stable cholelithiasis without complicating factors. Calcified uterine fibroids. Fatty infiltration of the liver. No acute abnormality noted. Electronically Signed   By: Inez Catalina M.D.   On: 06/29/2019 10:28   ECHOCARDIOGRAM COMPLETE  Result Date: 07/19/2019   ECHOCARDIOGRAM LIMITED REPORT   Patient Name:   Shannon Garrett Date of Exam: 07/19/2019 Medical Rec #:  [id number]     Height:       65.0 in Accession #:    [id number]    Weight:       264.6 lb Date of Birth:  [birth year]     BSA:          2.23 m Patient Age:    44 years      BP:           132/73 mmHg Patient Gender: F             HR:           102 bpm. Exam Location:  Inpatient  Procedure: 2D Echo STAT ECHO Indications:    chest pain 786.50  History:        Patient has prior history of Echocardiogram examinations, most                 recent 09/20/2017.  Sonographer:    Johny Chess Referring Phys: [id number] ABRAHAM FELIZ ORTIZ  Sonographer Comments: Patient is morbidly obese. IMPRESSIONS  1. Left ventricular ejection fraction, by visual estimation, is 40 to 45%. The left ventricle has mildly decreased function. There is no increased left ventricular wall thickness.  2. Indeterminate diastolic filling due to E-A fusion.  3. The left ventricle demonstrates global hypokinesis.  4. The right ventricle has normal systolic function.The right ventricular size is normal.  5. The mitral valve is normal in  structure. Mild mitral valve regurgitation.  6. The tricuspid valve was normal in structure.  7. Tricuspid valve regurgitation is mild-moderate.  8. Moderately elevated pulmonary artery systolic pressure.  9. Compared to 2016 and 2019 reports, LV systolic function has deteriorated. Side by side comparison was not available. FINDINGS  Left Ventricle: Left ventricular ejection fraction, by visual estimation, is 40 to 45%. The left ventricle has mildly decreased function. The left ventricle demonstrates global hypokinesis. The left ventricular internal cavity size was the left ventricle is normal in size. There is no increased left ventricular wall thickness. Indeterminate diastolic filling due to E-A fusion. Right Ventricle: The right ventricular size is normal. No increase in right ventricular wall thickness. Global RV systolic function is has normal systolic function. The tricuspid regurgitant velocity is 3.31 m/s, and with an assumed right atrial pressure  of 10 mmHg, the estimated right ventricular systolic pressure is moderately elevated at 53.8 mmHg. Left Atrium: Left atrial size was normal. Right Atrium: Right atrial size was normal. Right atrial pressure is estimated at 10 mmHg. Pericardium: There is no evidence of pericardial effusion is seen. There is no evidence of pericardial effusion. Mitral Valve: The mitral valve is normal in structure. Mild mitral valve regurgitation. Tricuspid Valve: The tricuspid valve is normal in structure. Tricuspid valve regurgitation mild-moderate. Aortic Valve: The aortic valve is normal in structure. Aortic valve regurgitation  is mild. Pulmonic Valve: The pulmonic valve was not well visualized. Pulmonic valve regurgitation is not visualized by color flow Doppler. Pulmonic regurgitation is not visualized by color flow Doppler. Aorta: The aortic root, ascending aorta and aortic arch are all structurally normal, with no evidence of dilitation or obstruction. Shunts: No atrial  level shunt detected by color flow Doppler.  LEFT VENTRICLE PLAX 2D LVIDd:         5.05 cm LVIDs:         3.98 cm LV PW:         0.90 cm LV IVS:        0.90 cm LV SV:         52 ml LV SV Index:   21.50  RIGHT VENTRICLE RV S prime:     12.70 cm/s TAPSE (M-mode): 1.2 cm LEFT ATRIUM             Index       RIGHT ATRIUM           Index LA diam:        3.50 cm 1.57 cm/m  RA Area:     13.10 cm LA Vol (A2C):   26.4 ml 11.85 ml/m RA Volume:   33.30 ml  14.95 ml/m LA Vol (A4C):   38.0 ml 17.06 ml/m LA Biplane Vol: 33.5 ml 15.04 ml/m  AORTIC VALVE LVOT Vmax:   89.40 cm/s LVOT Vmean:  61.400 cm/s LVOT VTI:    0.133 m  AORTA Ao Root diam: 2.70 cm TRICUSPID VALVE TR Peak grad:   43.8 mmHg TR Vmax:        331.00 cm/s  SHUNTS Systemic VTI: 0.13 m  Dani Gobble Croitoru MD Electronically signed by Sanda Klein MD Signature Date/Time: 07/19/2019/7:02:08 PM    Final    US Abdomen Limited RUQ  Result Date: 07/19/2019 CLINICAL DATA:  Right upper quadrant pain. Gallstones on recent CT. Elevated white blood cell count. EXAM: ULTRASOUND ABDOMEN LIMITED RIGHT UPPER QUADRANT COMPARISON:  Abdominal CT 06/29/2019 FINDINGS: Gallbladder: Distended with multiple intraluminal gallstones. Mild gallbladder wall thickening of 3-4 mm. No pericholecystic fluid. Positive sonographic Murphy sign noted by sonographer. Common bile duct: Diameter: 3 mm, normal. Liver: Heterogeneously increased in parenchymal echogenicity. The liver parenchyma is difficult to penetrate. No evidence of focal lesion. Portal vein is patent on color Doppler imaging with normal direction of blood flow towards the liver. Other: None. IMPRESSION: 1. Gallstones with mild gallbladder wall thickening and positive sonographic Murphy sign. Findings are consistent with acute cholecystitis in the appropriate clinical setting. 2. No biliary dilatation. 3. Hepatic steatosis. Electronically Signed   By: Keith Rake M.D.   On: 07/19/2019 00:50    Labs:  CBC: Recent Labs     05/18/19 0451 06/24/19 1602 07/18/19 2151 07/19/19 0830  WBC 10.3 11.8* 13.7* 15.1*  HGB 11.0* 8.9 Repeated and verified X2.* 11.4* 11.1*  HCT 33.4* 27.2* 35.3* 33.9*  PLT 358 599.0* 471* 482*    COAGS: Recent Labs    05/10/19 1452 05/10/19 1956 05/12/19 0150  INR 1.1 1.1 1.0  APTT 38*  --   --     BMP: Recent Labs    05/17/19 0411 05/17/19 0411 05/18/19 0451 06/24/19 1602 07/18/19 2151 07/20/19 0818  NA 140   < > 143 139 139 143  K 4.3   < > 4.1 3.5 3.7 3.8  CL 104   < > 103 103 100 108  CO2 25   < > 29 23 28  27  GLUCOSE 241*   < > 249* 171* 163* 128*  BUN 34*   < > 36* 16 14 13   CALCIUM 9.1   < > 9.3 9.0 9.2 8.2*  CREATININE 0.86   < > 0.92 0.97 1.16* 1.06*  GFRNONAA >60  --  >60  --  53* 59*  GFRAA >60  --  >60  --  >60 >60   < > = values in this interval not displayed.    LIVER FUNCTION TESTS: Recent Labs    05/17/19 0411 05/18/19 0451 06/24/19 1602 07/18/19 2151  BILITOT 0.9 0.6 1.4* 0.9  AST 33 51* 339* 25  ALT 68* 80* 521* 41  ALKPHOS 259* 255* 1,018* 433*  PROT 6.1* 6.1* 6.9 7.7  ALBUMIN 2.7* 2.7* 3.2* 3.5    TUMOR MARKERS: No results for input(s): AFPTM, CEA, CA199, CHROMGRNA in the last 8760 hours.  Assessment and Plan: 59 y.o. female with history of cardiomyopathy, heart failure, COVID-19 in November of last year, depression, hypertension and MS with paraplegia/bedbound.  She has a history of severe calculus cholecystitis and was scheduled for cholecystectomy today but procedure had to be aborted due to gallbladder being stuck to omentum.  Request now received for percutaneous cholecystostomy.  Case has been reviewed by Dr. Kathlene Cote.  Details/risks of procedure, including but not limited to, internal bleeding, infection, injury to adjacent structures, need for prolonged drainage discussed with patient with her understanding and consent.  Procedure scheduled for 1/19.  Patient currently on IV meropenem.   Thank you for this interesting  consult.  I greatly enjoyed meeting Shannon Garrett and look forward to participating in their care.  A copy of this report was sent to the requesting provider on this date.  Electronically Signed: D. Rowe Robert, PA-C 07/20/2019, 4:12 PM   I spent a total of 25 minutes in face to face in clinical consultation, greater than 50% of which was counseling/coordinating care for percutaneous cholecystostomy

## 2019-07-20 NOTE — Transfer of Care (Signed)
Immediate Anesthesia Transfer of Care Note  Patient: Shannon Garrett  Procedure(s) Performed: DIAGNOSTIC LAPAROSCOPY (N/A )  Patient Location: PACU  Anesthesia Type:General  Level of Consciousness: awake and alert   Airway & Oxygen Therapy: Patient Spontanous Breathing and Patient connected to face mask oxygen  Post-op Assessment: Report given to RN and Post -op Vital signs reviewed and stable  Post vital signs: Reviewed and stable  Last Vitals:  Vitals Value Taken Time  BP    Temp    Pulse    Resp    SpO2      Last Pain:  Vitals:   07/20/19 1106  TempSrc:   PainSc: 4       Patients Stated Pain Goal: 3 ([id number])  Complications: No apparent anesthesia complications

## 2019-07-20 NOTE — Anesthesia Postprocedure Evaluation (Signed)
Anesthesia Post Note  Patient: Shannon Garrett  Procedure(s) Performed: DIAGNOSTIC LAPAROSCOPY (N/A )     Patient location during evaluation: PACU Anesthesia Type: General Level of consciousness: awake and alert and oriented Pain management: pain level controlled Vital Signs Assessment: post-procedure vital signs reviewed and stable Respiratory status: spontaneous breathing, nonlabored ventilation, respiratory function stable and patient connected to nasal cannula oxygen Cardiovascular status: blood pressure returned to baseline and stable Postop Assessment: no apparent nausea or vomiting Anesthetic complications: no    Last Vitals:  Vitals:   07/20/19 1445 07/20/19 1504  BP: 140/69 (!) 153/76  Pulse: 95 95  Resp: 10 14  Temp: 37.2 C 36.6 C  SpO2: 98% 95%    Last Pain:  Vitals:   07/20/19 1504  TempSrc: Oral  PainSc:                  Davona Kinoshita A.

## 2019-07-20 NOTE — Progress Notes (Signed)
CRITICAL VALUE ALERT  Critical Value:  Positive MRSA PCR  Date & Time Notied:  07/20/19, 1526  Provider Notified: MD Olevia Bowens  Orders Received/Actions taken: Positive MRSA order set placed.

## 2019-07-20 NOTE — Anesthesia Preprocedure Evaluation (Addendum)
Anesthesia Evaluation  Patient identified by MRN, date of birth, ID band Patient awake    Reviewed: Allergy & Precautions, NPO status , Patient's Chart, lab work & pertinent test results, reviewed documented beta blocker date and time   Airway Mallampati: II  TM Distance: >3 FB Neck ROM: Full    Dental  (+) Dental Advisory Given, Poor Dentition, Missing   Pulmonary neg pulmonary ROS, former smoker,  COVID-19 + on 05/10/2019   Pulmonary exam normal breath sounds clear to auscultation       Cardiovascular hypertension, Pt. on home beta blockers +CHF  + dysrhythmias (LBBB) Supra Ventricular Tachycardia  Rhythm:Regular Rate:Tachycardia  Echocardiogram this admission showed LV function of 40 to 45%   Neuro/Psych PSYCHIATRIC DISORDERS Depression MS negative neurological ROS     GI/Hepatic Neg liver ROS, GERD  Medicated,Gallstones    Endo/Other  diabetes, Type 1, Insulin Dependent  Renal/GU negative Renal ROS     Musculoskeletal negative musculoskeletal ROS (+)   Abdominal   Peds  Hematology  (+) Blood dyscrasia, anemia ,   Anesthesia Other Findings Day of surgery medications reviewed with the patient.  Reproductive/Obstetrics                            Anesthesia Physical Anesthesia Plan  ASA: IV  Anesthesia Plan: General   Post-op Pain Management:    Induction: Intravenous  PONV Risk Score and Plan: 4 or greater and Midazolam, Dexamethasone and Ondansetron  Airway Management Planned: Oral ETT and Video Laryngoscope Planned  Additional Equipment:   Intra-op Plan:   Post-operative Plan: Extubation in OR  Informed Consent: I have reviewed the patients History and Physical, chart, labs and discussed the procedure including the risks, benefits and alternatives for the proposed anesthesia with the patient or authorized representative who has indicated his/her understanding and  acceptance.   Patient has DNR.  Discussed DNR with patient and Suspend DNR.   Dental advisory given  Plan Discussed with: CRNA  Anesthesia Plan Comments:        Anesthesia Quick Evaluation

## 2019-07-20 NOTE — Evaluation (Signed)
Physical Therapy Evaluation Patient Details Name: Shannon Garrett MRN: [id number] DOB: 07-28-1960 Today's Date: 07/20/2019   History of Present Illness  59 y.o. female past medical history significant for hypertension, bedbound multiple sclerosis, chronic diastolic heart failure presents to the emergency room right-sided abdominal pain nausea and anorexia she was recently admitted on November 2020 with COVID-19 and sepsis secondary to ESBL UTI for which she completed her treatment subsequently she developed C. difficile colitis for which she completed her treatment with oral vancomycin, she denies any diarrhea right upper quadrant ultrasound showed acute calculus cholecystitis she was started empirically on treatment.  Plan is for lap chole today 07/20/19  Clinical Impression  Pt admitted with above diagnosis.  Pt currently with functional limitations due to the deficits listed below (see PT Problem List). Pt will benefit from skilled PT to increase their independence and safety with mobility to allow discharge to the venue listed below.  Pt reports receiving PT at facility prior to admission.  Pt also reports she is typically able to move UEs better however L UE very weak since Covid diagnosis a couple months ago.  Pt also has history of MS.     Follow Up Recommendations SNF    Equipment Recommendations  None recommended by PT    Recommendations for Other Services       Precautions / Restrictions Precautions Precautions: Fall      Mobility  Bed Mobility Overal bed mobility: Needs Assistance Bed Mobility: Rolling Rolling: Total assist;+2 for physical assistance         General bed mobility comments: pt assisted with rolling for WOC RN to assess skin, also changed linen (wet), pt only able to slightly assist with holding with R arm (left roll)  Transfers                 General transfer comment: lift at baseline  Ambulation/Gait                Stairs             Wheelchair Mobility    Modified Rankin (Stroke Patients Only)       Balance                                             Pertinent Vitals/Pain Pain Assessment: Faces Faces Pain Scale: Hurts even more Pain Location: buttocks Pain Descriptors / Indicators: Sore Pain Intervention(s): Repositioned;Monitored during session;Patient requesting pain meds-RN notified    Home Living Family/patient expects to be discharged to:: Skilled nursing facility                      Prior Function Level of Independence: Needs assistance   Gait / Transfers Assistance Needed: requires mechanical lift and assistance with ADL's.     Comments: pt reports she was receiving therapy prior to admission     Hand Dominance        Extremity/Trunk Assessment   Upper Extremity Assessment Upper Extremity Assessment: RUE deficits/detail;LUE deficits/detail RUE Deficits / Details: observed approx 3/5 strength at best LUE Deficits / Details: per pt, unable to perform active movement since Covid dx    Lower Extremity Assessment Lower Extremity Assessment: LLE deficits/detail;RLE deficits/detail(pt reports sensation intact) RLE Deficits / Details: flaccid bil LEs except trace DF LLE Deficits / Details: flaccid bil LEs except trace DF  Communication   Communication: No difficulties  Cognition Arousal/Alertness: Awake/alert Behavior During Therapy: WFL for tasks assessed/performed Overall Cognitive Status: Within Functional Limits for tasks assessed                                        General Comments      Exercises     Assessment/Plan    PT Assessment Patient needs continued PT services  PT Problem List Decreased strength;Decreased mobility;Decreased range of motion;Obesity       PT Treatment Interventions Functional mobility training;Neuromuscular re-education;Balance training;Therapeutic activities;Therapeutic  exercise;Patient/family education    PT Goals (Current goals can be found in the Care Plan section)  Acute Rehab PT Goals PT Goal Formulation: With patient Time For Goal Achievement: 08/03/19 Potential to Achieve Goals: Fair    Frequency Min 2X/week   Barriers to discharge        Co-evaluation               AM-PAC PT "6 Clicks" Mobility  Outcome Measure Help needed turning from your back to your side while in a flat bed without using bedrails?: Total Help needed moving from lying on your back to sitting on the side of a flat bed without using bedrails?: Total Help needed moving to and from a bed to a chair (including a wheelchair)?: Total Help needed standing up from a chair using your arms (e.g., wheelchair or bedside chair)?: Total Help needed to walk in hospital room?: Total Help needed climbing 3-5 steps with a railing? : Total 6 Click Score: 6    End of Session   Activity Tolerance: Patient tolerated treatment well Patient left: in bed;with call bell/phone within reach;with bed alarm set Nurse Communication: Mobility status PT Visit Diagnosis: Muscle weakness (generalized) (M62.81)    Time: 1010-1029 PT Time Calculation (min) (ACUTE ONLY): 19 min   Charges:   PT Evaluation $PT Eval Low Complexity: 1 Low        Kati PT, DPT Acute Rehabilitation Services Office: [phone]  Timberlee Roblero,KATHrine E 07/20/2019, 11:01 AM

## 2019-07-20 NOTE — Interval H&P Note (Signed)
History and Physical Interval Note:  07/20/2019 12:30 PM  Shannon Garrett  has presented today for surgery, with the diagnosis of gallstones.  The various methods of treatment have been discussed with the patient and family. After consideration of risks, benefits and other options for treatment, the patient has consented to  Procedure(s): LAPAROSCOPIC CHOLECYSTECTOMY WITH POSSIBLE INTRAOPERATIVE CHOLANGIOGRAM (N/A) as a surgical intervention.  The patient's history has been reviewed, patient examined, no change in status, stable for surgery.  I have reviewed the patient's chart and labs.  Questions were answered to the patient's satisfaction.    I believe the patient's symptoms are consistent with gallbladder disease.  We discussed gallbladder disease.   I discussed laparoscopic cholecystectomy with IOC in detail.    We discussed the risks and benefits of a laparoscopic cholecystectomy including, but not limited to bleeding, infection, injury to surrounding structures such as the intestine or liver, bile leak, retained gallstones, need to convert to an open procedure, prolonged diarrhea, blood clots such as  DVT, common bile duct injury, anesthesia risks, and possible need for additional procedures.  We discussed the typical post-operative recovery course. I explained that the likelihood of improvement of their symptoms is good.  Will give subcu heparin preop for dvt prop  [name]

## 2019-07-21 ENCOUNTER — Other Ambulatory Visit: Payer: Self-pay

## 2019-07-21 ENCOUNTER — Inpatient Hospital Stay (HOSPITAL_COMMUNITY): Payer: Medicare (Managed Care)

## 2019-07-21 HISTORY — PX: IR PERC CHOLECYSTOSTOMY: IMG2326

## 2019-07-21 LAB — COMPREHENSIVE METABOLIC PANEL
ALT: 26 U/L (ref 0–44)
AST: 16 U/L (ref 15–41)
Albumin: 2.8 g/dL — ABNORMAL LOW (ref 3.5–5.0)
Alkaline Phosphatase: 352 U/L — ABNORMAL HIGH (ref 38–126)
Anion gap: 10 (ref 5–15)
BUN: 11 mg/dL (ref 6–20)
CO2: 24 mmol/L (ref 22–32)
Calcium: 8.7 mg/dL — ABNORMAL LOW (ref 8.9–10.3)
Chloride: 109 mmol/L (ref 98–111)
Creatinine, Ser: 0.9 mg/dL (ref 0.44–1.00)
GFR calc Af Amer: >60 mL/min (ref >60)
GFR calc non Af Amer: >60 mL/min (ref >60)
Glucose, Bld: 150 mg/dL — ABNORMAL HIGH (ref 70–99)
Potassium: 4.1 mmol/L (ref 3.5–5.1)
Sodium: 143 mmol/L (ref 135–145)
Total Bilirubin: 0.5 mg/dL (ref 0.3–1.2)
Total Protein: 6.7 g/dL (ref 6.5–8.1)

## 2019-07-21 LAB — GLUCOSE, CAPILLARY
Glucose-Capillary: 128 mg/dL — ABNORMAL HIGH (ref 70–99)
Glucose-Capillary: 138 mg/dL — ABNORMAL HIGH (ref 70–99)
Glucose-Capillary: 139 mg/dL — ABNORMAL HIGH (ref 70–99)
Glucose-Capillary: 151 mg/dL — ABNORMAL HIGH (ref 70–99)
Glucose-Capillary: 160 mg/dL — ABNORMAL HIGH (ref 70–99)
Glucose-Capillary: 164 mg/dL — ABNORMAL HIGH (ref 70–99)
Glucose-Capillary: 180 mg/dL — ABNORMAL HIGH (ref 70–99)

## 2019-07-21 LAB — CBC
HCT: 31.2 % — ABNORMAL LOW (ref 36.0–46.0)
Hemoglobin: 9.8 g/dL — ABNORMAL LOW (ref 12.0–15.0)
MCH: 27.6 pg (ref 26.0–34.0)
MCHC: 31.4 g/dL (ref 30.0–36.0)
MCV: 87.9 fL (ref 80.0–100.0)
Platelets: 439 10*3/uL — ABNORMAL HIGH (ref 150–400)
RBC: 3.55 MIL/uL — ABNORMAL LOW (ref 3.87–5.11)
RDW: 16.5 % — ABNORMAL HIGH (ref 11.5–15.5)
WBC: 17.5 10*3/uL — ABNORMAL HIGH (ref 4.0–10.5)
nRBC: 0 % (ref 0.0–0.2)

## 2019-07-21 LAB — PROTIME-INR
INR: 1.2 (ref 0.8–1.2)
Prothrombin Time: 15.3 seconds — ABNORMAL HIGH (ref 11.4–15.2)

## 2019-07-21 MED ORDER — MIDAZOLAM HCL 2 MG/2ML IJ SOLN
INTRAMUSCULAR | Status: AC
  Filled 2019-07-21: qty 2

## 2019-07-21 MED ORDER — IOHEXOL 300 MG/ML  SOLN: 50.0000 mL | Freq: Once | INTRAMUSCULAR | Status: DC | PRN

## 2019-07-21 MED ORDER — MIDAZOLAM HCL 2 MG/2ML IJ SOLN
INTRAMUSCULAR | Status: AC | PRN
  Administered 2019-07-21 (×2): 1 mg via INTRAVENOUS

## 2019-07-21 MED ORDER — LIDOCAINE HCL 1 % IJ SOLN
INTRAMUSCULAR | Status: AC
  Filled 2019-07-21: qty 20

## 2019-07-21 MED ORDER — LIDOCAINE HCL (PF) 1 % IJ SOLN
INTRAMUSCULAR | Status: AC | PRN
  Administered 2019-07-21: 10 mL

## 2019-07-21 MED ORDER — FENTANYL CITRATE (PF) 100 MCG/2ML IJ SOLN
INTRAMUSCULAR | Status: AC | PRN
  Administered 2019-07-21 (×2): 50 ug via INTRAVENOUS

## 2019-07-21 MED ORDER — SODIUM CHLORIDE 0.9% FLUSH
5.0000 mL | Freq: Three times a day (TID) | INTRAVENOUS | Status: DC
  Administered 2019-07-21 – 2019-07-23 (×7): 5 mL

## 2019-07-21 MED ORDER — FENTANYL CITRATE (PF) 100 MCG/2ML IJ SOLN
INTRAMUSCULAR | Status: AC
  Filled 2019-07-21: qty 2

## 2019-07-21 MED ORDER — ENOXAPARIN SODIUM 60 MG/0.6ML ~~LOC~~ SOLN
0.5000 mg/kg | SUBCUTANEOUS | Status: DC
  Administered 2019-07-21 – 2019-07-22 (×2): 50 mg via SUBCUTANEOUS
  Filled 2019-07-21 (×2): qty 0.6

## 2019-07-21 NOTE — NC FL2 (Signed)
[place] LEVEL OF CARE SCREENING TOOL     IDENTIFICATION  Patient Name: Shannon Garrett Birthdate: 09/01/1960 Sex: female Admission Date (Current Location): 07/18/2019  [facility] and [place] Number:  Herbalist and Address:  [facility],  [address], [place]      Provider Number: [id number]  Attending Physician Name and Address:  Charlynne Cousins, MD  Relative Name and Phone Number:  Susen Ranz [phone]    Current Level of Care: Hospital Recommended Level of Care: Nursing Facility Prior Approval Number:    Date Approved/Denied:   PASRR Number:    Discharge Plan: Other (Comment)(LTC)    Current Diagnoses: Patient Active Problem List   Diagnosis Date Noted  . Acute calculous cholecystitis 07/19/2019  . Insulin dependent diabetes mellitus type IA ([place]) 07/19/2019  . History of Clostridioides difficile colitis 07/19/2019  . Multiple drug resistant organism (MDRO) Klebsiella pneumonia UTI culture positive 07/19/2019  . Enteritis due to Clostridium difficile 06/24/2019  . COVID-19 virus infection 06/24/2019  . Palliative care by specialist   . DNR (do not resuscitate)   . Sepsis ([place]) 05/10/2019  . History of COVID-19 Nov2020 05/10/2019  . Dysesthesia 08/26/2018  . Dyslipidemia 06/13/2017  . Elevated alkaline phosphatase level 06/13/2017  . Acute frontal sinusitis 05/24/2017  . Influenza 04/19/2017  . Glaucoma 04/06/2017  . Allergic rhinitis due to allergen 03/07/2017  . Vaginal candidiasis 03/01/2017  . UTI (urinary tract infection) 01/02/2017  . High risk medication use 01/25/2016  . Neuropathy 11/13/2015  . GERD (gastroesophageal reflux disease) 11/13/2015  . Dysphagia as late effect of stroke 11/08/2015  . Adrenal insufficiency (Addison's disease) ([place]) 08/12/2015  . Vitamin D deficiency 06/18/2015  . Hypokalemia 06/03/2015  . Chronic anemia 05/30/2015  . Chronic pain syndrome 03/03/2015  . Essential  hypertension   . Chronic constipation 11/26/2014  . Depression 11/26/2014  . Adult failure to thrive 07/31/2013  . Insomnia 06/03/2013  . Neurogenic bladder 06/03/2013  . Paraplegia ([place]) 09/05/2012  . Chronic diastolic CHF (congestive heart failure) ([place]) 05/22/2012  . Obesity 11/30/2008  . MS (multiple sclerosis) ([place]) 11/30/2008  . Hypertensive heart disease with CHF (congestive heart failure) ([place]) 11/30/2008    Orientation RESPIRATION BLADDER Height & Weight     Self, Time, Situation, Place  Normal Incontinent Weight: 99.5 kg Height:     BEHAVIORAL SYMPTOMS/MOOD NEUROLOGICAL BOWEL NUTRITION STATUS      Incontinent Diet(Regular)  AMBULATORY STATUS COMMUNICATION OF NEEDS Skin   Extensive Assist(Bedbound) Verbally Skin abrasions, Other (Comment)(stage 2 pressure ulcer, groin,breast moisture-nystatin powder)                       Personal Care Assistance Level of Assistance  Bathing, Feeding, Dressing Bathing Assistance: Limited assistance Feeding assistance: Limited assistance Dressing Assistance: Limited assistance     Functional Limitations Info  Sight, Hearing, Speech   Hearing Info: Adequate Speech Info: Adequate    SPECIAL CARE FACTORS FREQUENCY                       Contractures Contractures Info: Not present    Additional Factors Info  Code Status, Allergies Code Status Info: DNR Allergies Info: sulfonamides, penicillins           Current Medications (07/21/2019):  This is the current hospital active medication list Current Facility-Administered Medications  Medication Dose Route Frequency Provider Last Rate Last Admin  . acetaminophen (TYLENOL) tablet 650 mg  650 mg Oral Q6H PRN [name], MD   650 mg at 07/19/19 1259   Or  . acetaminophen (TYLENOL) suppository 650 mg  650 mg Rectal Q6H PRN [name], MD      . albuterol (PROVENTIL) (2.5 MG/3ML) 0.083% nebulizer solution 2.5 mg  2.5 mg Inhalation Q6H PRN [name], MD      .  Chlorhexidine Gluconate Cloth 2 % PADS 6 each  6 each Topical Q0600 Charlynne Cousins, MD   6 each at 07/21/19 [phone]  . dextrose 5 % and 0.45 % NaCl with KCl 20 mEq/L infusion   Intravenous Continuous [name], MD 50 mL/hr at 07/21/19 0924 New Bag at 07/21/19 [id number]  . diltiazem (CARDIZEM) 125 mg in dextrose 5% 125 mL (1 mg/mL) infusion  5-15 mg/hr Intravenous Titrated [name], MD 5 mL/hr at 07/21/19 0625 5 mg/hr at 07/21/19 0625  . enoxaparin (LOVENOX) injection 50 mg  0.5 mg/kg Subcutaneous Q24H Charlynne Cousins, MD      . HYDROmorphone (DILAUDID) injection 1 mg  1 mg Intravenous Q2H PRN [name], MD   1 mg at 07/21/19 1327  . insulin aspart (novoLOG) injection 0-9 Units  0-9 Units Subcutaneous Q4H [name], MD   1 Units at 07/21/19 1327  . iohexol (OMNIPAQUE) 300 MG/ML solution 50 mL  50 mL Other Once PRN Greggory Keen, MD      . lidocaine (XYLOCAINE) 1 % (with pres) injection           . lip balm (CARMEX) ointment   Topical PRN [name], MD   Given at 07/20/19 0208  . meropenem (MERREM) 1 g in sodium chloride 0.9 % 100 mL IVPB  1 g Intravenous Q8H [name], MD 200 mL/hr at 07/21/19 1330 1 g at 07/21/19 1330  . metoprolol tartrate (LOPRESSOR) injection 5 mg  5 mg Intravenous Q4H PRN [name], MD   5 mg at 07/19/19 1628  . metoprolol tartrate (LOPRESSOR) tablet 75 mg  75 mg Oral BID [name], MD   75 mg at 07/21/19 1058  . mupirocin ointment (BACTROBAN) 2 % 1 application  1 application Nasal BID Charlynne Cousins, MD   1 application at [phone]  . ondansetron (ZOFRAN) tablet 4 mg  4 mg Oral Q6H PRN [name], MD       Or  . ondansetron [facility]) injection 4 mg  4 mg Intravenous Q6H PRN [name], MD   4 mg at 07/19/19 1152  . saccharomyces boulardii (FLORASTOR) capsule 250 mg  250 mg Oral BID Lang Snow, FNP   250 mg at 07/21/19 1058  . sodium chloride flush (NS) 0.9 % injection 3 mL  3 mL Intravenous Q12H [name], MD      . sodium  chloride flush (NS) 0.9 % injection 5 mL  5 mL Intracatheter Q8H Greggory Keen, MD   5 mL at 07/21/19 1328  . traMADol (ULTRAM) tablet 50 mg  50 mg Oral Q6H PRN [name], MD   50 mg at 07/20/19 2128     Discharge Medications: Please see discharge summary for a list of discharge medications.  Relevant Imaging Results:  Relevant Lab Results:   Additional Information SS # [id number]  Dessa Phi, RN

## 2019-07-21 NOTE — Progress Notes (Signed)
Progress Note  Patient Name: Shannon Garrett Date of Encounter: 07/21/2019  Primary Cardiologist: Peter Martinique, MD   Subjective   No overnight tachycardia. No chest pain or dyspnea. Has abdominal pain.  Inpatient Medications    Scheduled Meds: . Chlorhexidine Gluconate Cloth  6 each Topical Q0600  . heparin injection (subcutaneous)  5,000 Units Subcutaneous Q8H  . insulin aspart  0-9 Units Subcutaneous Q4H  . metoprolol tartrate  75 mg Oral BID  . mupirocin ointment  1 application Nasal BID  . saccharomyces boulardii  250 mg Oral BID  . sodium chloride flush  3 mL Intravenous Q12H   Continuous Infusions: . dextrose 5 % and 0.45 % NaCl with KCl 20 mEq/L 50 mL/hr at 07/21/19 0924  . diltiazem (CARDIZEM) infusion 5 mg/hr (07/21/19 0625)  . meropenem (MERREM) IV 1 g (07/21/19 0626)   PRN Meds: acetaminophen **OR** acetaminophen, albuterol, HYDROmorphone (DILAUDID) injection, lip balm, metoprolol tartrate, ondansetron **OR** ondansetron (ZOFRAN) IV, traMADol   Vital Signs    Vitals:   07/20/19 1600 07/20/19 1949 07/21/19 0400 07/21/19 0752  BP: (!) 145/79 114/65 111/82   Pulse: 94 (!) 102 89   Resp: 14 18 20    Temp: 97.8 F (36.6 C) 98.3 F (36.8 C) 98.1 F (36.7 C)   TempSrc: Oral Oral Oral   SpO2: 95% 97% 98%   Weight:    99.5 kg    Intake/Output Summary (Last 24 hours) at 07/21/2019 1016 Last data filed at 07/21/2019 0900 Gross per 24 hour  Intake 870.84 ml  Output 625 ml  Net 245.84 ml   Last 3 Weights 07/21/2019 05/10/2019 04/21/2018  Weight (lbs) 219 lb 6.4 oz 264 lb 8.8 oz 263 lb 6.4 oz  Weight (kg) 99.519 kg 120 kg 119.477 kg      Telemetry    SR at 46s wth rate PVCs - Personally Reviewed  ECG    No new tracing   Physical Exam   GEN: No acute distress.   Neck: No JVD Cardiac: RRR, no murmurs, rubs, or gallops.  Respiratory: Clear to auscultation bilaterally. GI: Soft, nontender, non-distended  MS: No edema; No deformity. Neuro:   paraglegia Psych: Normal affect   Labs    High Sensitivity Troponin:  No results for input(s): TROPONINIHS in the last 720 hours.    Chemistry Recent Labs  Lab 07/18/19 2151 07/20/19 0818 07/21/19 0425  NA 139 143 143  K 3.7 3.8 4.1  CL 100 108 109  CO2 28 27 24   GLUCOSE 163* 128* 150*  BUN 14 13 11   CREATININE 1.16* 1.06* 0.90  CALCIUM 9.2 8.2* 8.7*  PROT 7.7  --  6.7  ALBUMIN 3.5  --  2.8*  AST 25  --  16  ALT 41  --  26  ALKPHOS 433*  --  352*  BILITOT 0.9  --  0.5  GFRNONAA 53* 59* >60  GFRAA >60 >60 >60  ANIONGAP 11 8 10      Hematology Recent Labs  Lab 07/18/19 2151 07/19/19 0830 07/21/19 0425  WBC 13.7* 15.1* 17.5*  RBC 4.12 3.99 3.55*  HGB 11.4* 11.1* 9.8*  HCT 35.3* 33.9* 31.2*  MCV 85.7 85.0 87.9  MCH 27.7 27.8 27.6  MCHC 32.3 32.7 31.4  RDW 16.7* 16.7* 16.5*  PLT 471* 482* 439*     Radiology    ECHOCARDIOGRAM COMPLETE  Result Date: 07/19/2019   ECHOCARDIOGRAM LIMITED REPORT   Patient Name:   ANTONIETTE GUYETT Date of Exam: 07/19/2019 Medical Rec #:  [id number]     Height:       65.0 in Accession #:    [id number]    Weight:       264.6 lb Date of Birth:  09-08-1960     BSA:          2.23 m Patient Age:    59 years      BP:           132/73 mmHg Patient Gender: F             HR:           102 bpm. Exam Location:  Inpatient  Procedure: 2D Echo STAT ECHO Indications:    chest pain 786.50  History:        Patient has prior history of Echocardiogram examinations, most                 recent 09/20/2017.  Sonographer:    Johny Chess Referring Phys: [id number] ABRAHAM FELIZ ORTIZ  Sonographer Comments: Patient is morbidly obese. IMPRESSIONS  1. Left ventricular ejection fraction, by visual estimation, is 40 to 45%. The left ventricle has mildly decreased function. There is no increased left ventricular wall thickness.  2. Indeterminate diastolic filling due to E-A fusion.  3. The left ventricle demonstrates global hypokinesis.  4. The right ventricle has normal  systolic function.The right ventricular size is normal.  5. The mitral valve is normal in structure. Mild mitral valve regurgitation.  6. The tricuspid valve was normal in structure.  7. Tricuspid valve regurgitation is mild-moderate.  8. Moderately elevated pulmonary artery systolic pressure.  9. Compared to 2016 and 2019 reports, LV systolic function has deteriorated. Side by side comparison was not available. FINDINGS  Left Ventricle: Left ventricular ejection fraction, by visual estimation, is 40 to 45%. The left ventricle has mildly decreased function. The left ventricle demonstrates global hypokinesis. The left ventricular internal cavity size was the left ventricle is normal in size. There is no increased left ventricular wall thickness. Indeterminate diastolic filling due to E-A fusion. Right Ventricle: The right ventricular size is normal. No increase in right ventricular wall thickness. Global RV systolic function is has normal systolic function. The tricuspid regurgitant velocity is 3.31 m/s, and with an assumed right atrial pressure  of 10 mmHg, the estimated right ventricular systolic pressure is moderately elevated at 53.8 mmHg. Left Atrium: Left atrial size was normal. Right Atrium: Right atrial size was normal. Right atrial pressure is estimated at 10 mmHg. Pericardium: There is no evidence of pericardial effusion is seen. There is no evidence of pericardial effusion. Mitral Valve: The mitral valve is normal in structure. Mild mitral valve regurgitation. Tricuspid Valve: The tricuspid valve is normal in structure. Tricuspid valve regurgitation mild-moderate. Aortic Valve: The aortic valve is normal in structure. Aortic valve regurgitation is mild. Pulmonic Valve: The pulmonic valve was not well visualized. Pulmonic valve regurgitation is not visualized by color flow Doppler. Pulmonic regurgitation is not visualized by color flow Doppler. Aorta: The aortic root, ascending aorta and aortic arch are all  structurally normal, with no evidence of dilitation or obstruction. Shunts: No atrial level shunt detected by color flow Doppler.  LEFT VENTRICLE PLAX 2D LVIDd:         5.05 cm LVIDs:         3.98 cm LV PW:         0.90 cm LV IVS:        0.90 cm LV SV:  52 ml LV SV Index:   21.50  RIGHT VENTRICLE RV S prime:     12.70 cm/s TAPSE (M-mode): 1.2 cm LEFT ATRIUM             Index       RIGHT ATRIUM           Index LA diam:        3.50 cm 1.57 cm/m  RA Area:     13.10 cm LA Vol (A2C):   26.4 ml 11.85 ml/m RA Volume:   33.30 ml  14.95 ml/m LA Vol (A4C):   38.0 ml 17.06 ml/m LA Biplane Vol: 33.5 ml 15.04 ml/m  AORTIC VALVE LVOT Vmax:   89.40 cm/s LVOT Vmean:  61.400 cm/s LVOT VTI:    0.133 m  AORTA Ao Root diam: 2.70 cm TRICUSPID VALVE TR Peak grad:   43.8 mmHg TR Vmax:        331.00 cm/s  SHUNTS Systemic VTI: 0.13 m  Sanda Klein MD Electronically signed by Sanda Klein MD Signature Date/Time: 07/19/2019/7:02:08 PM    Final     Cardiac Studies   Echo 07/19/19 1. Left ventricular ejection fraction, by visual estimation, is 40 to 45%. The left ventricle has mildly decreased function. There is no increased left ventricular wall thickness. 2. Indeterminate diastolic filling due to E-A fusion. 3. The left ventricle demonstrates global hypokinesis. 4. The right ventricle has normal systolic function.The right ventricular size is normal. 5. The mitral valve is normal in structure. Mild mitral valve regurgitation. 6. The tricuspid valve was normal in structure. 7. Tricuspid valve regurgitation is mild-moderate. 8. Moderately elevated pulmonary artery systolic pressure. 9. Compared to 2016 and 2019 reports, LV systolic function has deteriorated. Side by side comparison was not available.  Patient Profile     60 y.o. female with a hx of multiple sclerosis (bed to wheelchair bound), HTN, prior nonischemic cardiomyopathy (EF of 20-25% in 2012) with improved LVEF, recurrent UTI, SNF resident and  depression who is being seen  for the evaluation of tachycardia and surgical clearance at the request of Dr. Aileen Fass.    Assessment & Plan    1. 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 -Echocardiogram in 2014, 2016 and 2019 showed normal LV function. -Echocardiogram this admission showed LV function of 40 to 45%.  See below. -Home Coreg 25 mg twice daily, spironolactone 25 mg daily and torsemide 20 mg daily on hold. Currently on metoprolol.  -Volume status stable.  - stop metoprolol and resume coreg at discharge along with spironolactone and torsemide.   2.  Tachycardia with rate related left bundle branch block -Patient reports intermittent tachycardia up to 130s for greater than 1 year. Her cardiomyopathy could be rate related.  Previously her cardiomyopathy felt nonischemic due to chemotherapy.  Certainly she could have underlying CAD given left bundle branch block.  LBBB is rate related when heart rate above 130 bpm.  Discontinued IV Cardizem (not at good option due to low EF). - No overnight tachycardia. Will consider outpatient ischemic evaluation.   3.Acute calculus cholecystitis Unable to complete cholecystectomy due to gallbladder being stuck to omentum. For percutaneous chole drain today.  [facility] will sign off.   Medication Recommendations:  As above Other recommendations (labs, testing, etc): ischemic eval  Follow up as an outpatient:  Will arrange   For questions or updates, please  contact [place] Please consult [email or web address] for contact info under        SignedLeanor Kail, PA  07/21/2019, 10:16 AM

## 2019-07-21 NOTE — Procedures (Signed)
Acute calculus cholecystitis  S/p PERC CHOLECYSTOSTOMY  No comp Stable ebl min Purulent bile asp cx sent Full report in pacs

## 2019-07-21 NOTE — TOC Initial Note (Signed)
Transition of Care [facility]) - Initial/Assessment Note    Patient Details  Name: Shannon Garrett MRN: [id number] Date of Birth: [birth year]  Transition of Care [facility]) CM/SW Contact:    Dessa Phi, RN Phone Number: 07/21/2019, 2:58 PM  Clinical Narrative:d/c back to [place] following.DNR in shadow chart for MD signature.                   Expected Discharge Plan: Long Term Nursing Home Barriers to Discharge: Continued Medical Work up   Patient Goals and CMS Choice        Expected Discharge Plan and Services Expected Discharge Plan: [place]   Discharge Planning Services: CM Consult   Living arrangements for the past 2 months: (LTC)                                      Prior Living Arrangements/Services Living arrangements for the past 2 months: (LTC) Lives with:: Facility Resident Patient language and need for interpreter reviewed:: Yes Do you feel safe going back to the place where you live?: Yes      Need for Family Participation in Patient Care: No (Comment) Care giver support system in place?: Yes (comment)   Criminal Activity/Legal Involvement Pertinent to Current Situation/Hospitalization: No - Comment as needed  Activities of Daily Living      Permission Sought/Granted Permission sought to share information with : Case Manager Permission granted to share information with : Yes, Verbal Permission Granted        Permission granted to share info w Relationship: Debra sister [phone]     Emotional Assessment Appearance:: Appears stated age Attitude/Demeanor/Rapport: Gracious Affect (typically observed): Accepting Orientation: : Oriented to Self, Oriented to Place, Oriented to  Time, Oriented to Situation Alcohol / Substance Use: Not Applicable Psych Involvement: No (comment)  Admission diagnosis:  Cholecystitis [K81.9] Acute calculous cholecystitis [K80.00] Patient Active Problem List   Diagnosis Date Noted  .  Acute calculous cholecystitis 07/19/2019  . Insulin dependent diabetes mellitus type IA ([place]) 07/19/2019  . History of Clostridioides difficile colitis 07/19/2019  . Multiple drug resistant organism (MDRO) Klebsiella pneumonia UTI culture positive 07/19/2019  . Enteritis due to Clostridium difficile 06/24/2019  . COVID-19 virus infection 06/24/2019  . Palliative care by specialist   . DNR (do not resuscitate)   . Sepsis ([place]) 05/10/2019  . History of COVID-19 Nov2020 05/10/2019  . Dysesthesia 08/26/2018  . Dyslipidemia 06/13/2017  . Elevated alkaline phosphatase level 06/13/2017  . Acute frontal sinusitis 05/24/2017  . Influenza 04/19/2017  . Glaucoma 04/06/2017  . Allergic rhinitis due to allergen 03/07/2017  . Vaginal candidiasis 03/01/2017  . UTI (urinary tract infection) 01/02/2017  . High risk medication use 01/25/2016  . Neuropathy 11/13/2015  . GERD (gastroesophageal reflux disease) 11/13/2015  . Dysphagia as late effect of stroke 11/08/2015  . Adrenal insufficiency ([place]'s disease) ([place]) 08/12/2015  . Vitamin D deficiency 06/18/2015  . Hypokalemia 06/03/2015  . Chronic anemia 05/30/2015  . Chronic pain syndrome 03/03/2015  . Essential hypertension   . Chronic constipation 11/26/2014  . Depression 11/26/2014  . Adult failure to thrive 07/31/2013  . Insomnia 06/03/2013  . Neurogenic bladder 06/03/2013  . Paraplegia ([place]) 09/05/2012  . Chronic diastolic CHF (congestive heart failure) ([place]) 05/22/2012  . Obesity 11/30/2008  . MS (multiple sclerosis) ([place]) 11/30/2008  . Hypertensive heart disease with CHF (congestive heart failure) (  [place]) 11/30/2008   PCP:  Gildardo Cranker, DO Pharmacy:   [facility] All Sites - [place], [address] [place] [address] Phone: [phone] Fax: [phone]  [facility] ([place], [address] [place]  [address] Phone: [phone] Fax: [phone]     Social Determinants of Health (SDOH) Interventions    Readmission Risk Interventions No flowsheet data found.

## 2019-07-21 NOTE — Addendum Note (Signed)
Addendum  created 07/21/19 0549 by Sharlette Dense, CRNA   Charge Capture section accepted

## 2019-07-21 NOTE — Progress Notes (Signed)
[facility] PROGRESS NOTE    Progress Note  Shannon Garrett  [id number] DOB: October 07, 1960 DOA: 07/18/2019 PCP: Gildardo Cranker, DO     Brief Narrative:   Shannon Garrett is an 59 y.o. female past medical history significant for hypertension, bedbound multiple sclerosis, chronic diastolic heart failure presents to the emergency room right-sided abdominal pain nausea and anorexia she was recently admitted on November 2020 with COVID-19 and sepsis secondary to ESBL UTI for which she completed her treatment subsequently she developed C. difficile colitis for which she completed her treatment with oral vancomycin, she denies any diarrhea right upper quadrant ultrasound showed acute calculus cholecystitis she was started empirically on treatment.  Assessment/Plan:   Sepsis due to acute calculous cholecystitis: Abdominal ultrasound on admission showed gallstones with mild gallbladder wall thickening and positive Murphy sign, with no bile duct dilation. She has been fluid resuscitated, pain control with analgesics. Surgery was consulted who performed a lap chole which was unsuccessful, they recommended to consult IR for possible percutaneous biliary drain. Continue IV meropenem due to her history of ESBL history. Keep the patient n.p.o.  Sinus tachycardia: With a 2D echo done on 07/19/2019 that showed an EF of 40-45%.  Cardiology who recommended to change her metoprolol to Coreg. They recommended no further ischemic evaluation and it was okay to proceed with surgery.  Insulin-dependent diabetes mellitus type 2: With an A1c of 7.6 back in November 2020, the patient remains n.p.o., continue D5 half-normal saline with potassium supplement.  Advanced MS: Nonambulatory at baseline continue supportive care. Consult physical therapy.  Chronic combined systolic diastolic heart failure: She is currently on oral metoprolol and IV metoprolol as needed.  2D echo done on 07/19/2019 with an EF of 40%  with left ventricular global hypokinesia. Cardiology was consulted recommended oral Coreg.  We will be judicious with IV fluids.  Acute kidney injury: Likely prerenal azotemia, resolved with IV fluid hydration her creatinine returned to baseline.  Stage II sacral decubitus ulcer present on admission: Consult wound care. RN Pressure Injury Documentation: Pressure Injury 05/11/19 Buttocks Left;Posterior Stage II -  Partial thickness loss of dermis presenting as a shallow open ulcer with a red, pink wound bed without slough. stage two  (Active)  05/11/19 1015  Location: Buttocks  Location Orientation: Left;Posterior  Staging: Stage II -  Partial thickness loss of dermis presenting as a shallow open ulcer with a red, pink wound bed without slough.  Wound Description (Comments): stage two   Present on Admission: Yes    Estimated body mass index is 44.02 kg/m as calculated from the following:   Height as of 05/10/19: 5\' 5"  (1.651 m).   Weight as of 05/10/19: 120 kg.   DVT prophylaxis: Lovenox Family Communication: None Disposition Plan/Barrier to D/C: When she has lap chole.  Patient came from skilled nursing facility will probably need skilled nursing facility.  Code Status:     Code Status Orders  (From admission, onward)           Start     Ordered   07/19/19 0401  Do not attempt resuscitation (DNR)  Continuous    Question Answer Comment  In the event of cardiac or respiratory ARREST Do not call a "code blue"   In the event of cardiac or respiratory ARREST Do not perform Intubation, CPR, defibrillation or ACLS   In the event of cardiac or respiratory ARREST Use medication by any route, position, wound care, and other measures to relive pain and suffering. May use  oxygen, suction and manual treatment of airway obstruction as needed for comfort.      07/19/19 0400           Code Status History     Date Active Date Inactive Code Status Order ID Comments User Context    05/10/2019 1940 05/18/2019 1732 DNR [id number]  Verlee Monte, MD ED   05/10/2019 1431 05/10/2019 1940 DNR [id number]  Lorin Glass, PA-C ED   11/07/2015 2300 11/10/2015 1615 DNR [id number]  Norval Morton, MD ED   07/21/2015 1338 07/25/2015 2137 DNR [id number]  Rush Farmer, MD Inpatient   07/10/2015 2024 07/21/2015 1338 Full Code [id number]  Juanito Doom, MD Inpatient   07/04/2015 1647 07/08/2015 1403 Full Code [id number]  Louellen Molder, MD Inpatient   06/08/2015 2210 06/12/2015 1610 Full Code [id number]  Theressa Millard, MD Inpatient   05/31/2015 0334 06/04/2015 1731 Full Code [id number]  Reubin Milan, MD Inpatient   01/11/2015 0242 01/17/2015 1846 Full Code [id number]  Allyne Gee, MD Inpatient   02/23/2014 2232 01/11/2015 0242 DNR [id number]  Hennie Duos, MD Outpatient   02/20/2014 0358 02/22/2014 1937 DNR [id number]  Toy Baker, MD ED   11/04/2013 1353 02/20/2014 0358 Full Code [id number]  Hennie Duos, MD Outpatient   07/31/2013 1932 08/02/2013 1650 Full Code [id number]  Allie Bossier, MD Inpatient   05/25/2013 0200 05/26/2013 1653 Full Code [id number]  Rise Patience, MD ED   09/10/2012 0211 09/11/2012 1806 Full Code [id number]  Samuella Cota, MD Inpatient   09/04/2012 1605 09/08/2012 2046 Full Code [id number]  Nita Sells, MD Inpatient   04/28/2012 2308 05/01/2012 1928 Full Code [id number]  Domingo Dimes, RN Inpatient   Advance Care Planning Activity         IV Access:    Peripheral IV   Procedures and diagnostic studies:   ECHOCARDIOGRAM COMPLETE  Result Date: 07/19/2019   ECHOCARDIOGRAM LIMITED REPORT   Patient Name:   Shannon Garrett Date of Exam: 07/19/2019 Medical Rec #:  [id number]     Height:       65.0 in Accession #:    [id number]    Weight:       264.6 lb Date of Birth:  August 31, 1960     BSA:          2.23 m Patient Age:    89 years      BP:           132/73 mmHg Patient Gender: F             HR:           102 bpm. Exam Location:  Inpatient   Procedure: 2D Echo STAT ECHO Indications:    chest pain 786.50  History:        Patient has prior history of Echocardiogram examinations, most                 recent 09/20/2017.  Sonographer:    Johny Chess Referring Phys: [id number] Nickola Lenig FELIZ ORTIZ  Sonographer Comments: Patient is morbidly obese. IMPRESSIONS  1. Left ventricular ejection fraction, by visual estimation, is 40 to 45%. The left ventricle has mildly decreased function. There is no increased left ventricular wall thickness.  2. Indeterminate diastolic filling due to E-A fusion.  3. The left ventricle demonstrates global hypokinesis.  4. The right ventricle has normal systolic function.The right ventricular size is normal.  5. The mitral valve is normal in structure. Mild mitral valve  regurgitation.  6. The tricuspid valve was normal in structure.  7. Tricuspid valve regurgitation is mild-moderate.  8. Moderately elevated pulmonary artery systolic pressure.  9. Compared to 2016 and 2019 reports, LV systolic function has deteriorated. Side by side comparison was not available. FINDINGS  Left Ventricle: Left ventricular ejection fraction, by visual estimation, is 40 to 45%. The left ventricle has mildly decreased function. The left ventricle demonstrates global hypokinesis. The left ventricular internal cavity size was the left ventricle is normal in size. There is no increased left ventricular wall thickness. Indeterminate diastolic filling due to E-A fusion. Right Ventricle: The right ventricular size is normal. No increase in right ventricular wall thickness. Global RV systolic function is has normal systolic function. The tricuspid regurgitant velocity is 3.31 m/s, and with an assumed right atrial pressure  of 10 mmHg, the estimated right ventricular systolic pressure is moderately elevated at 53.8 mmHg. Left Atrium: Left atrial size was normal. Right Atrium: Right atrial size was normal. Right atrial pressure is estimated at 10 mmHg. Pericardium:  There is no evidence of pericardial effusion is seen. There is no evidence of pericardial effusion. Mitral Valve: The mitral valve is normal in structure. Mild mitral valve regurgitation. Tricuspid Valve: The tricuspid valve is normal in structure. Tricuspid valve regurgitation mild-moderate. Aortic Valve: The aortic valve is normal in structure. Aortic valve regurgitation is mild. Pulmonic Valve: The pulmonic valve was not well visualized. Pulmonic valve regurgitation is not visualized by color flow Doppler. Pulmonic regurgitation is not visualized by color flow Doppler. Aorta: The aortic root, ascending aorta and aortic arch are all structurally normal, with no evidence of dilitation or obstruction. Shunts: No atrial level shunt detected by color flow Doppler.  LEFT VENTRICLE PLAX 2D LVIDd:         5.05 cm LVIDs:         3.98 cm LV PW:         0.90 cm LV IVS:        0.90 cm LV SV:         52 ml LV SV Index:   21.50  RIGHT VENTRICLE RV S prime:     12.70 cm/s TAPSE (M-mode): 1.2 cm LEFT ATRIUM             Index       RIGHT ATRIUM           Index LA diam:        3.50 cm 1.57 cm/m  RA Area:     13.10 cm LA Vol (A2C):   26.4 ml 11.85 ml/m RA Volume:   33.30 ml  14.95 ml/m LA Vol (A4C):   38.0 ml 17.06 ml/m LA Biplane Vol: 33.5 ml 15.04 ml/m  AORTIC VALVE LVOT Vmax:   89.40 cm/s LVOT Vmean:  61.400 cm/s LVOT VTI:    0.133 m  AORTA Ao Root diam: 2.70 cm TRICUSPID VALVE TR Peak grad:   43.8 mmHg TR Vmax:        331.00 cm/s  SHUNTS Systemic VTI: 0.13 m  Dani Gobble Croitoru MD Electronically signed by Sanda Klein MD Signature Date/Time: 07/19/2019/7:02:08 PM    Final      Medical Consultants:    None.  Anti-Infectives:   IV meropenem  Subjective:    Shannon Garrett she related she wanted to go home, she relates her pain is controlled she remains anorexic.  Objective:    Vitals:   07/20/19 1523 07/20/19 1600 07/20/19 1949 07/21/19 0400  BP: (!) 144/73 Marland Kitchen)  145/79 114/65 111/82  Pulse: 94 94 (!) 102  89  Resp: 14 14 18 20   Temp: 98 F (36.7 C) 97.8 F (36.6 C) 98.3 F (36.8 C) 98.1 F (36.7 C)  TempSrc: Oral Oral Oral Oral  SpO2: 94% 95% 97% 98%   SpO2: 98 % O2 Flow Rate (L/min): 10 L/min   Intake/Output Summary (Last 24 hours) at 07/21/2019 0744 Last data filed at 07/21/2019 0400 Gross per 24 hour  Intake 870.84 ml  Output 625 ml  Net 245.84 ml   There were no vitals filed for this visit.  Exam: General exam: In no acute distress. Respiratory system: Good air movement and clear to auscultation. Cardiovascular system: S1 & S2 heard, RRR. No JVD. Gastrointestinal system: Abdomen is nondistended, soft and nontender.  Central nervous system: Alert and oriented.  Extremities: No pedal edema. Skin: No rashes, lesions or ulcers   Data Reviewed:    Labs: Basic Metabolic Panel: Recent Labs  Lab 07/18/19 2151 07/18/19 2151 07/20/19 0818 07/21/19 0425  NA 139  --  143 143  K 3.7   < > 3.8 4.1  CL 100  --  108 109  CO2 28  --  27 24  GLUCOSE 163*  --  128* 150*  BUN 14  --  13 11  CREATININE 1.16*  --  1.06* 0.90  CALCIUM 9.2  --  8.2* 8.7*   < > = values in this interval not displayed.   GFR CrCl cannot be calculated (Unknown ideal weight.). Liver Function Tests: Recent Labs  Lab 07/18/19 2151 07/21/19 0425  AST 25 16  ALT 41 26  ALKPHOS 433* 352*  BILITOT 0.9 0.5  PROT 7.7 6.7  ALBUMIN 3.5 2.8*   Recent Labs  Lab 07/18/19 2151  LIPASE 14   No results for input(s): AMMONIA in the last 168 hours. Coagulation profile Recent Labs  Lab 07/21/19 0425  INR 1.2   COVID-19 Labs  No results for input(s): DDIMER, FERRITIN, LDH, CRP in the last 72 hours.  Lab Results  Component Value Date   SARSCOV2NAA POSITIVE (A) 05/10/2019    CBC: Recent Labs  Lab 07/18/19 2151 07/19/19 0830 07/21/19 0425  WBC 13.7* 15.1* 17.5*  NEUTROABS  --  11.7*  --   HGB 11.4* 11.1* 9.8*  HCT 35.3* 33.9* 31.2*  MCV 85.7 85.0 87.9  PLT 471* 482* 439*   Cardiac  Enzymes: No results for input(s): CKTOTAL, CKMB, CKMBINDEX, TROPONINI in the last 168 hours. BNP (last 3 results) No results for input(s): PROBNP in the last 8760 hours. CBG: Recent Labs  Lab 07/20/19 1148 07/20/19 1658 07/20/19 1948 07/21/19 0020 07/21/19 0409  GLUCAP 149* 208* 243* 180* 128*   D-Dimer: No results for input(s): DDIMER in the last 72 hours. Hgb A1c: No results for input(s): HGBA1C in the last 72 hours. Lipid Profile: No results for input(s): CHOL, HDL, LDLCALC, TRIG, CHOLHDL, LDLDIRECT in the last 72 hours. Thyroid function studies: No results for input(s): TSH, T4TOTAL, T3FREE, THYROIDAB in the last 72 hours.  Invalid input(s): FREET3 Anemia work up: No results for input(s): VITAMINB12, FOLATE, FERRITIN, TIBC, IRON, RETICCTPCT in the last 72 hours. Sepsis Labs: Recent Labs  Lab 07/18/19 2151 07/19/19 0830 07/21/19 0425  WBC 13.7* 15.1* 17.5*   Microbiology Recent Results (from the past 240 hour(s))  Surgical pcr screen     Status: Abnormal   Collection Time: 07/20/19 10:45 AM   Specimen: Nasal Mucosa; Nasal Swab  Result Value Ref Range Status  MRSA, PCR POSITIVE (A) NEGATIVE Final    Comment: RESULT CALLED TO, READ BACK BY AND VERIFIED WITH: NELSON,L. RN @1529  07/20/19 BILLINGSLEY,L    Staphylococcus aureus POSITIVE (A) NEGATIVE Final    Comment: (NOTE) The Xpert SA Assay (FDA approved for NASAL specimens in patients 14 years of age and older), is one component of a comprehensive surveillance program. It is not intended to diagnose infection nor to guide or monitor treatment. Performed at [facility], [place] [address] [place], [place] [address]      Medications:   . Chlorhexidine Gluconate Cloth  6 each Topical Q0600  . heparin injection (subcutaneous)  5,000 Units Subcutaneous Q8H  . insulin aspart  0-9 Units Subcutaneous Q4H  . metoprolol tartrate  75 mg Oral BID  . mupirocin ointment  1 application Nasal BID  .  saccharomyces boulardii  250 mg Oral BID  . sodium chloride flush  3 mL Intravenous Q12H   Continuous Infusions: . dextrose 5 % and 0.45 % NaCl with KCl 20 mEq/L Stopped (07/20/19 1249)  . diltiazem (CARDIZEM) infusion 5 mg/hr (07/21/19 0625)  . meropenem (MERREM) IV 1 g (07/21/19 0626)     LOS: 2 days   Charlynne Cousins  [facility]  07/21/2019, 7:44 AM

## 2019-07-21 NOTE — Progress Notes (Signed)
[facility] Progress Note  1 Day Post-Op  Subjective: CC: abdominal pain  Patient reports RUQ abdominal pain. She was disappointed that she was unable to get gallbladder out yesterday but understands plan and reasoning. Denies nausea this AM. Having bowel function.   Objective: Vital signs in last 24 hours: Temp:  [97.8 F (36.6 C)-99.4 F (37.4 C)] 98.1 F (36.7 C) (01/19 0400) Pulse Rate:  [89-105] 89 (01/19 0400) Resp:  [10-20] 20 (01/19 0400) BP: (111-154)/(63-92) 111/82 (01/19 0400) SpO2:  [94 %-100 %] 98 % (01/19 0400) Weight:  [99.5 kg] 99.5 kg (01/19 0752) Last BM Date: 07/21/19  Intake/Output from previous day: 01/18 0701 - 01/19 0700 In: 870.8 [I.V.:670.8; IV Piggyback:200] Out: 625 [Urine:600; Blood:25] Intake/Output this shift: No intake/output data recorded.  PE: Gen:  Alert, NAD Card:  Regular rate and rhythm Pulm:  Normal effort, clear to auscultation bilaterally Abd: Soft, ttp in RUQ, non-distended, +BS, incisions C/D/I Skin: warm and dry, no rashes  Psych: A&Ox3   Lab Results:  Recent Labs    07/19/19 0830 07/21/19 0425  WBC 15.1* 17.5*  HGB 11.1* 9.8*  HCT 33.9* 31.2*  PLT 482* 439*   BMET Recent Labs    07/20/19 0818 07/21/19 0425  NA 143 143  K 3.8 4.1  CL 108 109  CO2 27 24  GLUCOSE 128* 150*  BUN 13 11  CREATININE 1.06* 0.90  CALCIUM 8.2* 8.7*   PT/INR Recent Labs    07/21/19 0425  LABPROT 15.3*  INR 1.2   CMP     Component Value Date/Time   NA 143 07/21/2019 0425   NA 140 08/26/2018 0959   K 4.1 07/21/2019 0425   CL 109 07/21/2019 0425   CO2 24 07/21/2019 0425   GLUCOSE 150 (H) 07/21/2019 0425   BUN 11 07/21/2019 0425   BUN 10 08/26/2018 0959   CREATININE 0.90 07/21/2019 0425   CALCIUM 8.7 (L) 07/21/2019 0425   PROT 6.7 07/21/2019 0425   PROT 6.4 08/26/2018 0959   ALBUMIN 2.8 (L) 07/21/2019 0425   ALBUMIN 3.9 08/26/2018 0959   AST 16 07/21/2019 0425   ALT 26 07/21/2019 0425   ALKPHOS 352 (H)  07/21/2019 0425   BILITOT 0.5 07/21/2019 0425   BILITOT 0.2 08/26/2018 0959   GFRNONAA >60 07/21/2019 0425   GFRAA >60 07/21/2019 0425   Lipase     Component Value Date/Time   LIPASE 14 07/18/2019 2151       Studies/Results: ECHOCARDIOGRAM COMPLETE  Result Date: 07/19/2019   ECHOCARDIOGRAM LIMITED REPORT   Patient Name:   BROOKIE STANFILL Date of Exam: 07/19/2019 Medical Rec #:  [id number]     Height:       65.0 in Accession #:    [id number]    Weight:       264.6 lb Date of Birth:  [birth year]     BSA:          2.23 m Patient Age:    53 years      BP:           132/73 mmHg Patient Gender: F             HR:           102 bpm. Exam Location:  Inpatient  Procedure: 2D Echo STAT ECHO Indications:    chest pain 786.50  History:        Patient has prior history of Echocardiogram examinations, most  recent 09/20/2017.  Sonographer:    Johny Chess Referring Phys: [id number] ABRAHAM FELIZ ORTIZ  Sonographer Comments: Patient is morbidly obese. IMPRESSIONS  1. Left ventricular ejection fraction, by visual estimation, is 40 to 45%. The left ventricle has mildly decreased function. There is no increased left ventricular wall thickness.  2. Indeterminate diastolic filling due to E-A fusion.  3. The left ventricle demonstrates global hypokinesis.  4. The right ventricle has normal systolic function.The right ventricular size is normal.  5. The mitral valve is normal in structure. Mild mitral valve regurgitation.  6. The tricuspid valve was normal in structure.  7. Tricuspid valve regurgitation is mild-moderate.  8. Moderately elevated pulmonary artery systolic pressure.  9. Compared to 2016 and 2019 reports, LV systolic function has deteriorated. Side by side comparison was not available. FINDINGS  Left Ventricle: Left ventricular ejection fraction, by visual estimation, is 40 to 45%. The left ventricle has mildly decreased function. The left ventricle demonstrates global hypokinesis. The left  ventricular internal cavity size was the left ventricle is normal in size. There is no increased left ventricular wall thickness. Indeterminate diastolic filling due to E-A fusion. Right Ventricle: The right ventricular size is normal. No increase in right ventricular wall thickness. Global RV systolic function is has normal systolic function. The tricuspid regurgitant velocity is 3.31 m/s, and with an assumed right atrial pressure  of 10 mmHg, the estimated right ventricular systolic pressure is moderately elevated at 53.8 mmHg. Left Atrium: Left atrial size was normal. Right Atrium: Right atrial size was normal. Right atrial pressure is estimated at 10 mmHg. Pericardium: There is no evidence of pericardial effusion is seen. There is no evidence of pericardial effusion. Mitral Valve: The mitral valve is normal in structure. Mild mitral valve regurgitation. Tricuspid Valve: The tricuspid valve is normal in structure. Tricuspid valve regurgitation mild-moderate. Aortic Valve: The aortic valve is normal in structure. Aortic valve regurgitation is mild. Pulmonic Valve: The pulmonic valve was not well visualized. Pulmonic valve regurgitation is not visualized by color flow Doppler. Pulmonic regurgitation is not visualized by color flow Doppler. Aorta: The aortic root, ascending aorta and aortic arch are all structurally normal, with no evidence of dilitation or obstruction. Shunts: No atrial level shunt detected by color flow Doppler.  LEFT VENTRICLE PLAX 2D LVIDd:         5.05 cm LVIDs:         3.98 cm LV PW:         0.90 cm LV IVS:        0.90 cm LV SV:         52 ml LV SV Index:   21.50  RIGHT VENTRICLE RV S prime:     12.70 cm/s TAPSE (M-mode): 1.2 cm LEFT ATRIUM             Index       RIGHT ATRIUM           Index LA diam:        3.50 cm 1.57 cm/m  RA Area:     13.10 cm LA Vol (A2C):   26.4 ml 11.85 ml/m RA Volume:   33.30 ml  14.95 ml/m LA Vol (A4C):   38.0 ml 17.06 ml/m LA Biplane Vol: 33.5 ml 15.04 ml/m   AORTIC VALVE LVOT Vmax:   89.40 cm/s LVOT Vmean:  61.400 cm/s LVOT VTI:    0.133 m  AORTA Ao Root diam: 2.70 cm TRICUSPID VALVE TR Peak grad:   43.8 mmHg  TR Vmax:        331.00 cm/s  SHUNTS Systemic VTI: 0.13 m  Sanda Klein MD Electronically signed by Sanda Klein MD Signature Date/Time: 07/19/2019/7:02:08 PM    Final     Anti-infectives: Anti-infectives (From admission, onward)   Start     Dose/Rate Route Frequency Ordered Stop   07/20/19 2200  fluconazole (DIFLUCAN) tablet 150 mg     150 mg Oral Daily 07/20/19 2133 07/20/19 2159   07/20/19 1245  meropenem (MERREM) 1 g in sodium chloride 0.9 % 100 mL IVPB     1 g 200 mL/hr over 30 Minutes Intravenous  Once 07/20/19 1243 07/20/19 1551   07/19/19 0415  meropenem (MERREM) 1 g in sodium chloride 0.9 % 100 mL IVPB     1 g 200 mL/hr over 30 Minutes Intravenous Every 8 hours 07/19/19 0409     07/19/19 0200  ciprofloxacin (CIPRO) IVPB 400 mg     400 mg 200 mL/hr over 60 Minutes Intravenous  Once 07/19/19 0146 07/19/19 0447       Assessment/Plan MS with paraplegia HTN bedbound Chronic systolic heart failure Cardiomyopathy with EF of 20-25%  Acute cholecystitis S/p diagnostic laparoscopy 07/20/19 Dr. Redmond Pulling - unable to complete cholecystectomy due to marked inflammation - IR was consulted for perc chole drain  - WBC 17, afebrile, LFTs trending down - ok to have clears after drain placement and advance as tolerated - will plan for interval cholecystectomy in 6-8 weeks  FEN - npo for procedure VTE - SQ heparin ID - merrem 1/17>>   LOS: 2 days    Brigid Re , [facility] 07/21/2019, 9:36 AM Please see Amion for pager number during day hours 7:00am-4:30pm

## 2019-07-22 ENCOUNTER — Inpatient Hospital Stay (HOSPITAL_COMMUNITY): Payer: Medicare (Managed Care)

## 2019-07-22 LAB — GLUCOSE, CAPILLARY
Glucose-Capillary: 85 mg/dL (ref 70–99)
Glucose-Capillary: 108 mg/dL — ABNORMAL HIGH (ref 70–99)
Glucose-Capillary: 131 mg/dL — ABNORMAL HIGH (ref 70–99)
Glucose-Capillary: 117 mg/dL — ABNORMAL HIGH (ref 70–99)
Glucose-Capillary: 130 mg/dL — ABNORMAL HIGH (ref 70–99)

## 2019-07-22 LAB — CBC
HCT: 33.3 % — ABNORMAL LOW (ref 36.0–46.0)
Hemoglobin: 10.4 g/dL — ABNORMAL LOW (ref 12.0–15.0)
MCH: 27.3 pg (ref 26.0–34.0)
MCHC: 31.2 g/dL (ref 30.0–36.0)
MCV: 87.4 fL (ref 80.0–100.0)
Platelets: 493 10*3/uL — ABNORMAL HIGH (ref 150–400)
RBC: 3.81 MIL/uL — ABNORMAL LOW (ref 3.87–5.11)
RDW: 16.6 % — ABNORMAL HIGH (ref 11.5–15.5)
WBC: 15.6 10*3/uL — ABNORMAL HIGH (ref 4.0–10.5)
nRBC: 0 % (ref 0.0–0.2)

## 2019-07-22 MED ORDER — DULOXETINE HCL 60 MG PO CPEP
90.0000 mg | ORAL_CAPSULE | Freq: Every day | ORAL | Status: DC
  Administered 2019-07-22 – 2019-07-23 (×2): 90 mg via ORAL
  Filled 2019-07-22 (×2): qty 1

## 2019-07-22 MED ORDER — BUPROPION HCL ER (XL) 150 MG PO TB24
150.0000 mg | ORAL_TABLET | Freq: Every day | ORAL | Status: DC
  Administered 2019-07-23: 150 mg via ORAL
  Filled 2019-07-22: qty 1

## 2019-07-22 MED ORDER — GABAPENTIN 400 MG PO CAPS
800.0000 mg | ORAL_CAPSULE | Freq: Three times a day (TID) | ORAL | Status: DC
  Administered 2019-07-22 – 2019-07-23 (×3): 800 mg via ORAL
  Filled 2019-07-22 (×3): qty 2

## 2019-07-22 MED ORDER — ENSURE MAX PROTEIN PO LIQD
11.0000 [oz_av] | Freq: Every day | ORAL | Status: DC
  Filled 2019-07-22 (×2): qty 330

## 2019-07-22 MED ORDER — PRO-STAT SUGAR FREE PO LIQD
30.0000 mL | Freq: Two times a day (BID) | ORAL | Status: DC
  Administered 2019-07-23: 30 mL via ORAL
  Filled 2019-07-22 (×2): qty 30

## 2019-07-22 MED ORDER — MELATONIN 5 MG PO TABS
10.0000 mg | ORAL_TABLET | Freq: Every day | ORAL | Status: DC
  Administered 2019-07-22: 10 mg via ORAL
  Filled 2019-07-22: qty 2

## 2019-07-22 MED ORDER — ADULT MULTIVITAMIN W/MINERALS CH
1.0000 | ORAL_TABLET | Freq: Every day | ORAL | Status: DC
  Administered 2019-07-22 – 2019-07-23 (×2): 1 via ORAL
  Filled 2019-07-22 (×2): qty 1

## 2019-07-22 MED ORDER — ENSURE ENLIVE PO LIQD: 237.0000 mL | Freq: Two times a day (BID) | ORAL | Status: DC

## 2019-07-22 MED ORDER — ACETAMINOPHEN 325 MG PO TABS
650.0000 mg | ORAL_TABLET | Freq: Four times a day (QID) | ORAL | Status: DC
  Administered 2019-07-22 – 2019-07-23 (×4): 650 mg via ORAL
  Filled 2019-07-22 (×4): qty 2

## 2019-07-22 MED ORDER — AMANTADINE HCL 100 MG PO CAPS
100.0000 mg | ORAL_CAPSULE | Freq: Every day | ORAL | Status: DC
  Administered 2019-07-22 – 2019-07-23 (×2): 100 mg via ORAL
  Filled 2019-07-22 (×2): qty 1

## 2019-07-22 NOTE — Progress Notes (Signed)
PHARMACY NOTE:  Pharmacy has been assisting with dosing of Meropenem for acute cholecystitis. Dosage remains stable at 1g IV q8h and need for further dosage adjustment appears unlikely at present.    Will sign off at this time.  Please reconsult if a change in clinical status warrants re-evaluation of dosage.   Lindell Spar, PharmD, BCPS Clinical Pharmacist  07/22/2019 2:38 PM

## 2019-07-22 NOTE — Progress Notes (Signed)
Referring Physician(s): Hendrum  Supervising Physician: Corrie Mckusick  Patient Status:  [name] - In-pt  Chief Complaint: Abdominal pain/cholecystitis   Subjective: Patient currently eating breakfast.  Denies nausea or vomiting.  Does have some mild discomfort at gallbladder drain insertion site.    Allergies: Sulfonamide derivatives and Penicillins  Medications: Prior to Admission medications   Medication Sig Start Date End Date Taking? Authorizing Provider  acetaminophen (TYLENOL) 325 MG tablet Take 650 mg by mouth every 8 (eight) hours as needed for mild pain, moderate pain or headache.    Yes [provider]  albuterol (PROVENTIL) (2.5 MG/3ML) 0.083% nebulizer solution Take 3 mLs (2.5 mg total) by nebulization every 2 (two) hours as needed for wheezing. 11/10/15  Yes Regalado, Belkys A, MD  albuterol (VENTOLIN HFA) 108 (90 Base) MCG/ACT inhaler Inhale 2 puffs into the lungs every 6 (six) hours as needed for wheezing or shortness of breath.  07/10/19  Yes [provider]  amantadine (SYMMETREL) 100 MG capsule Take 100 mg by mouth daily.    Yes [provider]  Amino Acids-Protein Hydrolys (FEEDING SUPPLEMENT, PRO-STAT SUGAR FREE 64,) LIQD Take 30 mLs by mouth 3 (three) times daily with meals.   Yes [provider]  ascorbic acid (VITAMIN C) 500 MG tablet Take 500 mg by mouth daily.    Yes [provider]  aspirin 325 MG EC tablet Take 325 mg by mouth daily.   Yes [provider]  Baclofen 5 MG TABS Take 5 mg by mouth 4 (four) times daily. Hold if lethargic or confused  12/04/17  Yes [provider]  buPROPion (WELLBUTRIN XL) 150 MG 24 hr tablet Take 150 mg by mouth daily after breakfast.   Yes [provider]  carvedilol (COREG) 25 MG tablet Take 1 tablet (25 mg total) by mouth 2 (two) times daily with a meal. 07/25/15  Yes Rai, Ripudeep K, MD  CRANBERRY PO Take 100 mg by mouth at bedtime.    Yes [provider]  DULoxetine (CYMBALTA) 30 MG capsule Take 90 mg by mouth daily. 07/18/19  Yes [provider]  Ensure (ENSURE) Take 237 mLs by mouth 2 (two) times daily between meals.   Yes [provider]  famotidine (PEPCID) 20 MG tablet Take 1 tablet (20 mg total) by mouth 2 (two) times daily. 07/25/15  Yes Rai, Ripudeep K, MD  ferrous sulfate 325 (65 FE) MG tablet Take 325 mg by mouth 2 (two) times daily with a meal.  02/20/18  Yes [provider]  fluticasone (FLONASE) 50 MCG/ACT nasal spray Place 2 sprays into both nostrils every evening.   Yes [provider]  gabapentin (NEURONTIN) 800 MG tablet Take 800 mg by mouth 3 (three) times daily.   Yes [provider]  insulin aspart (NOVOLOG FLEXPEN) 100 UNIT/ML FlexPen 0-15 Units, Subcutaneous, 3 times daily with meals CBG < 70: implement hypoglycemia protocol-call MD CBG 70 - 120: 0 units CBG 121 - 150: 2 units CBG 151 - 200: 3 units CBG 201 - 250: 5 units CBG 251 - 300: 8 units CBG 301 - 350: 11 units CBG 351 - 400: 15 units CBG > 400: 05/18/19  Yes Ghimire, Henreitta Leber, MD  insulin glargine (LANTUS) 100 UNIT/ML injection Inject 0.1 mLs (10 Units total) into the skin daily. 05/19/19  Yes Ghimire, Henreitta Leber, MD  Lactobacillus (PROBIOTIC ACIDOPHILUS) CAPS Take 1 capsule by mouth daily.    Yes [provider]  latanoprost (XALATAN) 0.005 %  ophthalmic solution Place 1 drop into both eyes at bedtime.   Yes [provider]  lubiprostone (AMITIZA) 24 MCG capsule Take 24 mcg by mouth 2 (two) times daily with a meal.   Yes [provider]  Melatonin 5 MG TABS Take 10 mg by mouth at bedtime.  11/14/17  Yes [provider]  metoprolol tartrate (LOPRESSOR) 25 MG tablet Take 12.5 mg by mouth 2 (two) times daily.   Yes [provider]  Multiple Vitamins-Minerals (MULTIVITAMIN ADULT PO) Take 1 tablet by mouth daily.   Yes [provider]  omeprazole (PRILOSEC) 20  MG capsule Take 20 mg by mouth daily.   Yes [provider]  ondansetron (ZOFRAN) 4 MG tablet Take 4 mg by mouth every 6 (six) hours as needed for nausea or vomiting. 11/01/17  Yes [provider]  potassium chloride (KLOR-CON) 20 MEQ packet Take 20 mEq by mouth daily.    Yes [provider]  senna-docusate (SENOKOT S) 8.6-50 MG tablet Take 2 tablets by mouth 2 (two) times daily.    Yes [provider]  sodium phosphate Pediatric (FLEET) 3.5-9.5 GM/59ML enema Place 1 enema rectally once as needed for mild constipation, moderate constipation or severe constipation.   Yes [provider]  spironolactone (ALDACTONE) 25 MG tablet Take 25 mg by mouth daily. For CHF   Yes [provider]  tamsulosin (FLOMAX) 0.4 MG CAPS capsule Take 0.4 mg by mouth daily. For incontinence   Yes [provider]  torsemide (DEMADEX) 20 MG tablet Take 20 mg by mouth daily.    Yes [provider]  traMADol (ULTRAM) 50 MG tablet Take 1 tablet (50 mg total) by mouth every 8 (eight) hours as needed for moderate pain or severe pain. 05/18/19  Yes Ghimire, Henreitta Leber, MD  predniSONE (DELTASONE) 10 MG tablet Take 40 mg daily for 1 day, 30 mg daily for 1 day, 20 mg daily for 1 days,10 mg daily for 1 day, then stop Patient not taking: Reported on 07/20/2019 05/18/19   Jonetta Osgood, MD     Vital Signs: BP (!) 141/99 (BP Location: Right Arm)   Pulse 90   Temp (!) 97.4 F (36.3 C) (Oral)   Resp 18   Ht 5\' 3"  (1.6 m)   Wt 219 lb 5.7 oz (99.5 kg)   SpO2 100%   BMI 38.86 kg/m   Physical Exam awake, alert.  Gallbladder drain intact, insertion site okay, mildly tender, output 60 cc blood-tinged golden bile, drain flushed without difficulty  Imaging: IR Perc Cholecystostomy  Result Date: 07/21/2019 INDICATION: Acute calculus cholecystitis EXAM: ULTRASOUND AND FLUOROSCOPIC PERCUTANEOUS CHOLECYSTOSTOMY MEDICATIONS: Patient is already receiving as inpatient  ANESTHESIA/SEDATION: Moderate (conscious) sedation was employed during this procedure. A total of Versed 0.0 mg and Fentanyl 100 mcg was administered intravenously. Moderate Sedation Time: 10 minutes. The patient's level of consciousness and vital signs were monitored continuously by radiology nursing throughout the procedure under my direct supervision. FLUOROSCOPY TIME:  Fluoroscopy Time: 1 minutes 12 seconds (22 mGy). COMPLICATIONS: None immediate. PROCEDURE: Informed written consent was obtained from the patient after a thorough discussion of the procedural risks, benefits and alternatives. All questions were addressed. Maximal Sterile Barrier Technique was utilized including caps, mask, sterile gowns, sterile gloves, sterile drape, hand hygiene and skin antiseptic. A timeout was performed prior to the initiation of the procedure. Previous imaging reviewed. Preliminary ultrasound. The distended gallbladder containing echogenic gallstones was localized in the mid axillary line along the subcostal  margin. Under sterile conditions and local anesthesia, percutaneous needle access performed of the distended gallbladder fundus. Needle position confirmed with ultrasound. There was return purulent bile. Sample sent culture. Guidewire advanced followed by the Accustick dilator set. Amplatz guidewire advanced followed by tract dilatation insert a 10 French drain. Drain catheter position confirmed with ultrasound and fluoroscopy. Syringe aspiration yielded 50 cc purulent bile. This decompressed gallbladder. Catheter secured with Prolene suture and a sterile dressing. Gravity drainage bag connected. Patient tolerated the procedure well. No immediate complication. IMPRESSION: Successful ultrasound and fluoroscopic percutaneous cholecystostomy. Electronically Signed   By: Jerilynn Mages.  Shick M.D.   On: 07/21/2019 14:44   DG Abd Portable 1V  Result Date: 07/22/2019 CLINICAL DATA:  Recent cholecystostomy EXAM: PORTABLE ABDOMEN - 1  VIEW COMPARISON:  None. FINDINGS: Scattered large and small bowel gas is noted. Mild retained fecal material is seen. No obstructive changes are noted. No free air is seen. Cholecystostomy tube is noted in the right upper quadrant. No bony abnormality is noted. IMPRESSION: No obstructive changes are seen. Electronically Signed   By: Inez Catalina M.D.   On: 07/22/2019 10:21   ECHOCARDIOGRAM COMPLETE  Result Date: 07/19/2019   ECHOCARDIOGRAM LIMITED REPORT   Patient Name:   Shannon Garrett Date of Exam: 07/19/2019 Medical Rec #:  [id number]     Height:       65.0 in Accession #:    [id number]    Weight:       264.6 lb Date of Birth:  [birth year]     BSA:          2.23 m Patient Age:    31 years      BP:           132/73 mmHg Patient Gender: F             HR:           102 bpm. Exam Location:  Inpatient  Procedure: 2D Echo STAT ECHO Indications:    chest pain 786.50  History:        Patient has prior history of Echocardiogram examinations, most                 recent 09/20/2017.  Sonographer:    Johny Chess Referring Phys: [id number] ABRAHAM FELIZ ORTIZ  Sonographer Comments: Patient is morbidly obese. IMPRESSIONS  1. Left ventricular ejection fraction, by visual estimation, is 40 to 45%. The left ventricle has mildly decreased function. There is no increased left ventricular wall thickness.  2. Indeterminate diastolic filling due to E-A fusion.  3. The left ventricle demonstrates global hypokinesis.  4. The right ventricle has normal systolic function.The right ventricular size is normal.  5. The mitral valve is normal in structure. Mild mitral valve regurgitation.  6. The tricuspid valve was normal in structure.  7. Tricuspid valve regurgitation is mild-moderate.  8. Moderately elevated pulmonary artery systolic pressure.  9. Compared to 2016 and 2019 reports, LV systolic function has deteriorated. Side by side comparison was not available. FINDINGS  Left Ventricle: Left ventricular ejection fraction, by visual  estimation, is 40 to 45%. The left ventricle has mildly decreased function. The left ventricle demonstrates global hypokinesis. The left ventricular internal cavity size was the left ventricle is normal in size. There is no increased left ventricular wall thickness. Indeterminate diastolic filling due to E-A fusion. Right Ventricle: The right ventricular size is normal. No increase in right ventricular wall thickness. Global RV systolic function is has normal systolic function. The  tricuspid regurgitant velocity is 3.31 m/s, and with an assumed right atrial pressure  of 10 mmHg, the estimated right ventricular systolic pressure is moderately elevated at 53.8 mmHg. Left Atrium: Left atrial size was normal. Right Atrium: Right atrial size was normal. Right atrial pressure is estimated at 10 mmHg. Pericardium: There is no evidence of pericardial effusion is seen. There is no evidence of pericardial effusion. Mitral Valve: The mitral valve is normal in structure. Mild mitral valve regurgitation. Tricuspid Valve: The tricuspid valve is normal in structure. Tricuspid valve regurgitation mild-moderate. Aortic Valve: The aortic valve is normal in structure. Aortic valve regurgitation is mild. Pulmonic Valve: The pulmonic valve was not well visualized. Pulmonic valve regurgitation is not visualized by color flow Doppler. Pulmonic regurgitation is not visualized by color flow Doppler. Aorta: The aortic root, ascending aorta and aortic arch are all structurally normal, with no evidence of dilitation or obstruction. Shunts: No atrial level shunt detected by color flow Doppler.  LEFT VENTRICLE PLAX 2D LVIDd:         5.05 cm LVIDs:         3.98 cm LV PW:         0.90 cm LV IVS:        0.90 cm LV SV:         52 ml LV SV Index:   21.50  RIGHT VENTRICLE RV S prime:     12.70 cm/s TAPSE (M-mode): 1.2 cm LEFT ATRIUM             Index       RIGHT ATRIUM           Index LA diam:        3.50 cm 1.57 cm/m  RA Area:     13.10 cm LA Vol  (A2C):   26.4 ml 11.85 ml/m RA Volume:   33.30 ml  14.95 ml/m LA Vol (A4C):   38.0 ml 17.06 ml/m LA Biplane Vol: 33.5 ml 15.04 ml/m  AORTIC VALVE LVOT Vmax:   89.40 cm/s LVOT Vmean:  61.400 cm/s LVOT VTI:    0.133 m  AORTA Ao Root diam: 2.70 cm TRICUSPID VALVE TR Peak grad:   43.8 mmHg TR Vmax:        331.00 cm/s  SHUNTS Systemic VTI: 0.13 m  Dani Gobble Croitoru MD Electronically signed by Sanda Klein MD Signature Date/Time: 07/19/2019/7:02:08 PM    Final    US Abdomen Limited RUQ  Result Date: 07/19/2019 CLINICAL DATA:  Right upper quadrant pain. Gallstones on recent CT. Elevated white blood cell count. EXAM: ULTRASOUND ABDOMEN LIMITED RIGHT UPPER QUADRANT COMPARISON:  Abdominal CT 06/29/2019 FINDINGS: Gallbladder: Distended with multiple intraluminal gallstones. Mild gallbladder wall thickening of 3-4 mm. No pericholecystic fluid. Positive sonographic Murphy sign noted by sonographer. Common bile duct: Diameter: 3 mm, normal. Liver: Heterogeneously increased in parenchymal echogenicity. The liver parenchyma is difficult to penetrate. No evidence of focal lesion. Portal vein is patent on color Doppler imaging with normal direction of blood flow towards the liver. Other: None. IMPRESSION: 1. Gallstones with mild gallbladder wall thickening and positive sonographic Murphy sign. Findings are consistent with acute cholecystitis in the appropriate clinical setting. 2. No biliary dilatation. 3. Hepatic steatosis. Electronically Signed   By: Keith Rake M.D.   On: 07/19/2019 00:50    Labs:  CBC: Recent Labs    07/18/19 2151 07/19/19 0830 07/21/19 0425 07/22/19 0333  WBC 13.7* 15.1* 17.5* 15.6*  HGB 11.4* 11.1* 9.8* 10.4*  HCT 35.3* 33.9*  31.2* 33.3*  PLT 471* 482* 439* 493*    COAGS: Recent Labs    05/10/19 1452 05/10/19 1956 05/12/19 0150 07/21/19 0425  INR 1.1 1.1 1.0 1.2  APTT 38*  --   --   --     BMP: Recent Labs    05/18/19 0451 05/18/19 0451 06/24/19 1602 07/18/19 2151  07/20/19 0818 07/21/19 0425  NA 143   < > 139 139 143 143  K 4.1   < > 3.5 3.7 3.8 4.1  CL 103   < > 103 100 108 109  CO2 29   < > 23 28 27 24   GLUCOSE 249*   < > 171* 163* 128* 150*  BUN 36*   < > 16 14 13 11   CALCIUM 9.3   < > 9.0 9.2 8.2* 8.7*  CREATININE 0.92   < > 0.97 1.16* 1.06* 0.90  GFRNONAA >60  --   --  53* 59* >60  GFRAA >60  --   --  >60 >60 >60   < > = values in this interval not displayed.    LIVER FUNCTION TESTS: Recent Labs    05/18/19 0451 06/24/19 1602 07/18/19 2151 07/21/19 0425  BILITOT 0.6 1.4* 0.9 0.5  AST 51* 339* 25 16  ALT 80* 521* 41 26  ALKPHOS 255* 1,018* 433* 352*  PROT 6.1* 6.9 7.7 6.7  ALBUMIN 2.7* 3.2* 3.5 2.8*    Assessment and Plan: Patient with history of acute calculus cholecystitis and recently aborted cholecystectomy.  Status post percutaneous cholecystostomy on 1/19; afebrile, WBC 15.6 down from 17.5, hemoglobin 10.4 up from 9.8, bile cultures pending- antbx per primary team.  Abdominal film today with no obstructive changes seen.  Patient will need to keep drain in place for at least 4 to 6 weeks(unless surgery done in interval)- per [facility] patient tentatively planned for interval cholecystectomy in a couple of months.  Continue drain irrigation and close output monitoring; at discharge drain will need to be irrigated once daily with 5 cc sterile normal saline.   Electronically Signed: D. Rowe Robert, PA-C 07/22/2019, 10:34 AM   I spent a total of 15 minutes at the the patient's bedside AND on the patient's hospital floor or unit, greater than 50% of which was counseling/coordinating care for percutaneous cholecystostomy    Patient ID: Shannon Garrett, female   DOB: 12-16-1960, 59 y.o.   MRN: [id number]

## 2019-07-22 NOTE — Progress Notes (Signed)
PROGRESS NOTE    Shannon Garrett  [id number] DOB: August 09, 1960 DOA: 07/18/2019 PCP: Gildardo Cranker, DO    Brief Narrative:  Patient is 59 year old female with chronic medical issues including hypertension, bedbound secondary to multiple sclerosis, chronic diastolic heart failure presented to the emergency room with right-sided abdominal pain, nausea and anorexia.  She recently had COVID-19 infection and was admitted to hospital in November 2020.  She also has history of ESBL UTI.  Had developed C. difficile colitis and was treated with oral vancomycin.  In the emergency room, right upper quadrant ultrasound showed acute calculus cholecystitis.  She was empirically started on antibiotics and admitted to the hospital.   Assessment & Plan:   Principal Problem:   Acute calculous cholecystitis Active Problems:   MS (multiple sclerosis) (HCC)   Chronic diastolic CHF (congestive heart failure) (HCC)   Paraplegia (HCC)   Essential hypertension   Chronic pain syndrome   GERD (gastroesophageal reflux disease)   Sepsis (HCC)   Insulin dependent diabetes mellitus type IA ([facility])   History of COVID-19 [id number]   History of Clostridioides difficile colitis   Multiple drug resistant organism (MDRO) Klebsiella pneumonia UTI culture positive  Sepsis present on admission, resolved secondary to acute calculus cholecystitis: 1/18 taken to surgery for lap chole, that was unsuccessful because of severe inflammation. 1/19, IR guided percutaneous cholecystostomy drain placed. Blood cultures negative.  Bile cultures with E. coli, final pending.  With source control, will treat with 7 days of antibiotics, lower antibiotic course because of recent history of C. difficile. Continue monitoring. As per surgical plan, patient will be discharged with oral antibiotics, anticipate next 24 to 48 hours with interval cholecystectomy in 4 to 6 weeks.  Sinus tachycardia: Echocardiogram with ejection fraction 45%.   Cardiology had seen the patient.  Currently on metoprolol with improvement.  Insulin-dependent type 2 diabetes: A1c 7.6.  Currently on insulin regimen.  Discontinue IV fluids.  Advanced multiple sclerosis with bedbound status: Nonambulatory at baseline.  Long-term nursing home resident.  Acute kidney injury: Improved.  Stage II sacral decubitus ulcer present on admission: Local wound care.   DVT prophylaxis: Lovenox subcu Code Status: DNR Family Communication: None.  Called patient's sister, unable to reach. Disposition Plan: Back to nursing home next 24 to 48 hours   Consultants:   Surgery  Interventional radiology  Procedures:   Laparoscopy, no cholecystectomy 1/18  Percutaneous nephrostomy 1/19  Antimicrobials:  Anti-infectives (From admission, onward)   Start     Dose/Rate Route Frequency Ordered Stop   07/20/19 2200  fluconazole (DIFLUCAN) tablet 150 mg     150 mg Oral Daily 07/20/19 2133 07/20/19 2159   07/20/19 1245  meropenem (MERREM) 1 g in sodium chloride 0.9 % 100 mL IVPB     1 g 200 mL/hr over 30 Minutes Intravenous  Once 07/20/19 1243 07/20/19 1551   07/19/19 0415  meropenem (MERREM) 1 g in sodium chloride 0.9 % 100 mL IVPB     1 g 200 mL/hr over 30 Minutes Intravenous Every 8 hours 07/19/19 0409     07/19/19 0200  ciprofloxacin (CIPRO) IVPB 400 mg     400 mg 200 mL/hr over 60 Minutes Intravenous  Once 07/19/19 0146 07/19/19 0447         Subjective: Patient seen and examined.  She was very emotional and distraught, would not communicate with this provider.  She says that somebody commented on her body habitus and was very upset. Nursing reported abdominal pain and using Dilaudid in  the morning. No other overnight events.  Remains afebrile.  Objective: Vitals:   07/21/19 2317 07/22/19 0432 07/22/19 1222 07/22/19 1306  BP: 122/76 (!) 141/99 (!) 159/107 126/83  Pulse: 97 90 (!) 111 (!) 107  Resp: 18 18 17    Temp: 97.9 F (36.6 C) (!) 97.4 F (36.3  C) 98.8 F (37.1 C)   TempSrc: Oral Oral Oral   SpO2:  100% 97%   Weight: 99.5 kg     Height: 5\' 3"  (1.6 m)       Intake/Output Summary (Last 24 hours) at 07/22/2019 1526 Last data filed at 07/22/2019 0509 Gross per 24 hour  Intake 240 ml  Output 610 ml  Net -370 ml   Filed Weights   07/21/19 0752 07/21/19 2317  Weight: 99.5 kg 99.5 kg    Examination:  General exam: Appears calm and comfortable, chronically sick looking.  Currently anxious and tearful. Respiratory system: Clear to auscultation. Respiratory effort normal.  No added sounds. Cardiovascular system: S1 & S2 heard, RRR.  Gastrointestinal system: Abdomen is nondistended, soft and nontender.  No localized tenderness.  Percutaneous cholecystostomy tube draining bile, freely flowing. Central nervous system: Alert and oriented. No focal neurological deficits. Skin: No rashes, lesions or ulcers Psychiatry: Judgement and insight appear normal. Mood & affect anxious.    Data Reviewed: I have personally reviewed following labs and imaging studies  CBC: Recent Labs  Lab 07/18/19 2151 07/19/19 0830 07/21/19 0425 07/22/19 0333  WBC 13.7* 15.1* 17.5* 15.6*  NEUTROABS  --  11.7*  --   --   HGB 11.4* 11.1* 9.8* 10.4*  HCT 35.3* 33.9* 31.2* 33.3*  MCV 85.7 85.0 87.9 87.4  PLT 471* 482* 439* [id number]*   Basic Metabolic Panel: Recent Labs  Lab 07/18/19 2151 07/20/19 0818 07/21/19 0425  NA 139 143 143  K 3.7 3.8 4.1  CL 100 108 109  CO2 28 27 24   GLUCOSE 163* 128* 150*  BUN 14 13 11   CREATININE 1.16* 1.06* 0.90  CALCIUM 9.2 8.2* 8.7*   GFR: Estimated Creatinine Clearance: 78.5 mL/min (by C-G formula based on SCr of 0.9 mg/dL). Liver Function Tests: Recent Labs  Lab 07/18/19 2151 07/21/19 0425  AST 25 16  ALT 41 26  ALKPHOS 433* 352*  BILITOT 0.9 0.5  PROT 7.7 6.7  ALBUMIN 3.5 2.8*   Recent Labs  Lab 07/18/19 2151  LIPASE 14   No results for input(s): AMMONIA in the last 168 hours. Coagulation  Profile: Recent Labs  Lab 07/21/19 0425  INR 1.2   Cardiac Enzymes: No results for input(s): CKTOTAL, CKMB, CKMBINDEX, TROPONINI in the last 168 hours. BNP (last 3 results) No results for input(s): PROBNP in the last 8760 hours. HbA1C: No results for input(s): HGBA1C in the last 72 hours. CBG: Recent Labs  Lab 07/21/19 2046 07/21/19 2347 07/22/19 0429 07/22/19 0749 07/22/19 1216  GLUCAP 160* 164* 85 108* 131*   Lipid Profile: No results for input(s): CHOL, HDL, LDLCALC, TRIG, CHOLHDL, LDLDIRECT in the last 72 hours. Thyroid Function Tests: No results for input(s): TSH, T4TOTAL, FREET4, T3FREE, THYROIDAB in the last 72 hours. Anemia Panel: No results for input(s): VITAMINB12, FOLATE, FERRITIN, TIBC, IRON, RETICCTPCT in the last 72 hours. Sepsis Labs: No results for input(s): PROCALCITON, LATICACIDVEN in the last 168 hours.  Recent Results (from the past 240 hour(s))  Surgical pcr screen     Status: Abnormal   Collection Time: 07/20/19 10:45 AM   Specimen: Nasal Mucosa; Nasal Swab  Result Value  Ref Range Status   MRSA, PCR POSITIVE (A) NEGATIVE Final    Comment: RESULT CALLED TO, READ BACK BY AND VERIFIED WITH: NELSON,L. RN @1529  07/20/19 BILLINGSLEY,L    Staphylococcus aureus POSITIVE (A) NEGATIVE Final    Comment: (NOTE) The Xpert SA Assay (FDA approved for NASAL specimens in patients 19 years of age and older), is one component of a comprehensive surveillance program. It is not intended to diagnose infection nor to guide or monitor treatment. Performed at [facility], [place] [address], [place], [place] [address]   Aerobic/Anaerobic Culture (surgical/deep wound)     Status: None (Preliminary result)   Collection Time: 07/21/19 12:43 PM   Specimen: Gallbladder; Abscess  Result Value Ref Range Status   Specimen Description   Final    GALL BLADDER ABSCESS Performed at [facility], [address], [place], [place] [address]    Special  Requests   Final    Normal Performed at [facility] [address] [place], [place] [address]    Gram Stain   Final    ABUNDANT WBC PRESENT, PREDOMINANTLY PMN ABUNDANT GRAM POSITIVE COCCI FEW GRAM NEGATIVE RODS    Culture   Final    MODERATE FEW ESCHERICHIA COLI SUSCEPTIBILITIES TO FOLLOW Performed at [facility], [place] [address], [place], [place] [address]    Report Status PENDING  Incomplete         Radiology Studies: IR Perc Cholecystostomy  Result Date: 07/21/2019 INDICATION: Acute calculus cholecystitis EXAM: ULTRASOUND AND FLUOROSCOPIC PERCUTANEOUS CHOLECYSTOSTOMY MEDICATIONS: Patient is already receiving as inpatient ANESTHESIA/SEDATION: Moderate (conscious) sedation was employed during this procedure. A total of Versed 0.0 mg and Fentanyl 100 mcg was administered intravenously. Moderate Sedation Time: 10 minutes. The patient's level of consciousness and vital signs were monitored continuously by radiology nursing throughout the procedure under my direct supervision. FLUOROSCOPY TIME:  Fluoroscopy Time: 1 minutes 12 seconds (22 mGy). COMPLICATIONS: None immediate. PROCEDURE: Informed written consent was obtained from the patient after a thorough discussion of the procedural risks, benefits and alternatives. All questions were addressed. Maximal Sterile Barrier Technique was utilized including caps, mask, sterile gowns, sterile gloves, sterile drape, hand hygiene and skin antiseptic. A timeout was performed prior to the initiation of the procedure. Previous imaging reviewed. Preliminary ultrasound. The distended gallbladder containing echogenic gallstones was localized in the mid axillary line along the subcostal margin. Under sterile conditions and local anesthesia, percutaneous needle access performed of the distended gallbladder fundus. Needle position confirmed with ultrasound. There was return purulent bile. Sample sent culture. Guidewire advanced  followed by the Accustick dilator set. Amplatz guidewire advanced followed by tract dilatation insert a 10 French drain. Drain catheter position confirmed with ultrasound and fluoroscopy. Syringe aspiration yielded 50 cc purulent bile. This decompressed gallbladder. Catheter secured with Prolene suture and a sterile dressing. Gravity drainage bag connected. Patient tolerated the procedure well. No immediate complication. IMPRESSION: Successful ultrasound and fluoroscopic percutaneous cholecystostomy. Electronically Signed   By: Jerilynn Mages.  Shick M.D.   On: 07/21/2019 14:44   DG Abd Portable 1V  Result Date: 07/22/2019 CLINICAL DATA:  Recent cholecystostomy EXAM: PORTABLE ABDOMEN - 1 VIEW COMPARISON:  None. FINDINGS: Scattered large and small bowel gas is noted. Mild retained fecal material is seen. No obstructive changes are noted. No free air is seen. Cholecystostomy tube is noted in the right upper quadrant. No bony abnormality is noted. IMPRESSION: No obstructive changes are seen. Electronically Signed   By: Linus Mako.D.  On: 07/22/2019 10:21        Scheduled Meds: . acetaminophen  650 mg Oral Q6H  . Chlorhexidine Gluconate Cloth  6 each Topical Q0600  . enoxaparin (LOVENOX) injection  0.5 mg/kg Subcutaneous Q24H  . feeding supplement (ENSURE ENLIVE)  237 mL Oral BID BM  . feeding supplement (PRO-STAT SUGAR FREE 64)  30 mL Oral BID  . insulin aspart  0-9 Units Subcutaneous Q4H  . metoprolol tartrate  75 mg Oral BID  . multivitamin with minerals  1 tablet Oral Daily  . mupirocin ointment  1 application Nasal BID  . Ensure Max Protein  11 oz Oral Daily  . saccharomyces boulardii  250 mg Oral BID  . sodium chloride flush  3 mL Intravenous Q12H  . sodium chloride flush  5 mL Intracatheter Q8H   Continuous Infusions: . meropenem (MERREM) IV 1 g (07/22/19 1502)     LOS: 3 days    Time spent: 25 minutes    Barb Merino, MD [facility] Pager [phone]

## 2019-07-22 NOTE — Progress Notes (Signed)
Initial Nutrition Assessment  RD working remotely.   DOCUMENTATION CODES:   Obesity unspecified  INTERVENTION:  - will order Ensure Max once/day, each supplement provides 150 kcal and 30 grams protein. - will order 30 mL Prostat BID, each supplement provides 100 kcal and 15 grams of protein. - will order daily multivitamin with minerals.   NUTRITION DIAGNOSIS:   Increased nutrient needs related to acute illness as evidenced by estimated needs.  GOAL:   Patient will meet greater than or equal to 90% of their needs  MONITOR:   PO intake, Supplement acceptance, Labs, Weight trends  REASON FOR ASSESSMENT:   Malnutrition Screening Tool  ASSESSMENT:   58 y.o. female with medical history of HTN, bedbound status, multiple sclerosis, and CHF.  She presented to the ED with R-sided abdominal pain, N/V, and poor appetite. She was recently admitted in 05/2019 with COVID-19 and sepsis secondary to UTI. She developed C.diff and has completed treatment for the same. RUQ abdominal ultrasound showed acute calculus cholecystitis.  Diet advanced from NPO to CLD on 1/18 at 1550, to FLD on 1/19 at 1338, and to Heart Healthy today at 1045. Per flor sheet documentation, she consumed 100% of dinner last night (FLD).  Per chart review, weight on 1/19 was 219 and weight on 11/8 was 164 lb. This indicates 45 lb weight loss (17% body weight) in the past 9 weeks. Significant for time frame. Will continue to monitor weight trends and PO intakes and will adjust ONS if needed.  Patient is out of the room to IR. Will monitor for adjustments to food texture is needed and/or if feeding assistance is needed given hx of advanced MS.   Per notes: - perc cholecystostomy on 1/19 - sepsis 2/2 acute calculous cholecystitis--s/p lap chole - advanced MS--non-ambulatory at baseline - AKI   Labs reviewed; CBGs: 85, 108, and 131 mg/dl, Ca: 8.7 mg/dl, alk phos elevated.  Medications reviewed; sliding scale novolog,  250 mg florastor BID.     NUTRITION - FOCUSED PHYSICAL EXAM:  unable to complete at this time.   Diet Order:   Diet Order            Diet Heart Room service appropriate? Yes; Fluid consistency: Thin  Diet effective now              EDUCATION NEEDS:   No education needs have been identified at this time  Skin:  Skin Assessment: Skin Integrity Issues: Skin Integrity Issues:: Stage II, Incisions Stage II: L buttocks Incisions: abdomen (1/18)  Last BM:  1/19  Height:   Ht Readings from Last 1 Encounters:  07/21/19 5' 3" (1.6 m)    Weight:   Wt Readings from Last 1 Encounters:  07/21/19 99.5 kg    Ideal Body Weight:  52.3 kg  BMI:  Body mass index is 38.86 kg/m.  Estimated Nutritional Needs:   Kcal:  1990-2190 kcal  Protein:  100-115 grams  Fluid:  >/= 2 L/day       , MS, RD, LDN, CNSC Inpatient Clinical Dietitian Pager # [phone] After hours/weekend pager # [phone]  

## 2019-07-22 NOTE — Progress Notes (Signed)
Patient ID: Shannon Garrett, female   DOB: December 26, 1960, 59 y.o.   MRN: [id number]    2 Days Post-Op  Subjective: Feels a little better but feels bloated.  Tolerating some of her full liquids with no nausea.  Tube in place draining.  ROS: See above, otherwise other systems negative  Objective: Vital signs in last 24 hours: Temp:  [97.4 F (36.3 C)-98.4 F (36.9 C)] 97.4 F (36.3 C) (01/20 0432) Pulse Rate:  [85-97] 90 (01/20 0432) Resp:  [14-27] 18 (01/20 0432) BP: (116-141)/(68-99) 141/99 (01/20 0432) SpO2:  [87 %-100 %] 100 % (01/20 0432) Weight:  [99.5 kg] 99.5 kg (01/19 2317) Last BM Date: 07/21/19  Intake/Output from previous day: 01/19 0701 - 01/20 0700 In: 895.8 [P.O.:360; I.V.:330.8; IV Piggyback:200] Out: 610 [Urine:550; Drains:60] Intake/Output this shift: No intake/output data recorded.  PE: Abd: soft, but palpable intestine in the central part of her abdomen.  Perc chole drain with hydrops/bloody type output.  Lab Results:  Recent Labs    07/21/19 0425 07/22/19 0333  WBC 17.5* 15.6*  HGB 9.8* 10.4*  HCT 31.2* 33.3*  PLT 439* 493*   BMET Recent Labs    07/20/19 0818 07/21/19 0425  NA 143 143  K 3.8 4.1  CL 108 109  CO2 27 24  GLUCOSE 128* 150*  BUN 13 11  CREATININE 1.06* 0.90  CALCIUM 8.2* 8.7*   PT/INR Recent Labs    07/21/19 0425  LABPROT 15.3*  INR 1.2   CMP     Component Value Date/Time   NA 143 07/21/2019 0425   NA 140 08/26/2018 0959   K 4.1 07/21/2019 0425   CL 109 07/21/2019 0425   CO2 24 07/21/2019 0425   GLUCOSE 150 (H) 07/21/2019 0425   BUN 11 07/21/2019 0425   BUN 10 08/26/2018 0959   CREATININE 0.90 07/21/2019 0425   CALCIUM 8.7 (L) 07/21/2019 0425   PROT 6.7 07/21/2019 0425   PROT 6.4 08/26/2018 0959   ALBUMIN 2.8 (L) 07/21/2019 0425   ALBUMIN 3.9 08/26/2018 0959   AST 16 07/21/2019 0425   ALT 26 07/21/2019 0425   ALKPHOS 352 (H) 07/21/2019 0425   BILITOT 0.5 07/21/2019 0425   BILITOT 0.2 08/26/2018 0959   GFRNONAA >60 07/21/2019 0425   GFRAA >60 07/21/2019 0425   Lipase     Component Value Date/Time   LIPASE 14 07/18/2019 2151       Studies/Results: IR Perc Cholecystostomy  Result Date: 07/21/2019 INDICATION: Acute calculus cholecystitis EXAM: ULTRASOUND AND FLUOROSCOPIC PERCUTANEOUS CHOLECYSTOSTOMY MEDICATIONS: Patient is already receiving as inpatient ANESTHESIA/SEDATION: Moderate (conscious) sedation was employed during this procedure. A total of Versed 0.0 mg and Fentanyl 100 mcg was administered intravenously. Moderate Sedation Time: 10 minutes. The patient's level of consciousness and vital signs were monitored continuously by radiology nursing throughout the procedure under my direct supervision. FLUOROSCOPY TIME:  Fluoroscopy Time: 1 minutes 12 seconds (22 mGy). COMPLICATIONS: None immediate. PROCEDURE: Informed written consent was obtained from the patient after a thorough discussion of the procedural risks, benefits and alternatives. All questions were addressed. Maximal Sterile Barrier Technique was utilized including caps, mask, sterile gowns, sterile gloves, sterile drape, hand hygiene and skin antiseptic. A timeout was performed prior to the initiation of the procedure. Previous imaging reviewed. Preliminary ultrasound. The distended gallbladder containing echogenic gallstones was localized in the mid axillary line along the subcostal margin. Under sterile conditions and local anesthesia, percutaneous needle access performed of the distended gallbladder fundus. Needle position confirmed with ultrasound. There  was return purulent bile. Sample sent culture. Guidewire advanced followed by the Accustick dilator set. Amplatz guidewire advanced followed by tract dilatation insert a 10 French drain. Drain catheter position confirmed with ultrasound and fluoroscopy. Syringe aspiration yielded 50 cc purulent bile. This decompressed gallbladder. Catheter secured with Prolene suture and a sterile  dressing. Gravity drainage bag connected. Patient tolerated the procedure well. No immediate complication. IMPRESSION: Successful ultrasound and fluoroscopic percutaneous cholecystostomy. Electronically Signed   By: Jerilynn Mages.  Shick M.D.   On: 07/21/2019 14:44    Anti-infectives: Anti-infectives (From admission, onward)   Start     Dose/Rate Route Frequency Ordered Stop   07/20/19 2200  fluconazole (DIFLUCAN) tablet 150 mg     150 mg Oral Daily 07/20/19 2133 07/20/19 2159   07/20/19 1245  meropenem (MERREM) 1 g in sodium chloride 0.9 % 100 mL IVPB     1 g 200 mL/hr over 30 Minutes Intravenous  Once 07/20/19 1243 07/20/19 1551   07/19/19 0415  meropenem (MERREM) 1 g in sodium chloride 0.9 % 100 mL IVPB     1 g 200 mL/hr over 30 Minutes Intravenous Every 8 hours 07/19/19 0409     07/19/19 0200  ciprofloxacin (CIPRO) IVPB 400 mg     400 mg 200 mL/hr over 60 Minutes Intravenous  Once 07/19/19 0146 07/19/19 0447       Assessment/Plan MS with paraplegia HTN bedbound Chronic systolic heart failure Cardiomyopathy with EF of 20-25%  Acute cholecystitis POD 2, S/p diagnostic laparoscopy 07/20/19 Dr. Redmond Pulling -IR perc chole drain placement on 1/19 - unable to complete cholecystectomy due to marked inflammation - IR was consulted for perc chole drain  - WBC downtrending - seems to be more distended.  Will check a plain film before advancing to a solid diet - will plan for interval cholecystectomy in 6-8 weeks  FEN -full liquids VTE -SQ heparin ID -merrem 1/17>> recommend 10 days total of abx therapy.  If WBC continues to downtrend then plan for conversion to oral abx therapy   LOS: 3 days    Shannon Garrett , [facility] Surgery 07/22/2019, 9:59 AM Please see Amion for pager number during day hours 7:00am-4:30pm or 7:00am -11:30am on weekends

## 2019-07-22 NOTE — Discharge Instructions (Signed)
Cholecystostomy, Care After This sheet gives you information about how to care for yourself after your procedure. Your health care provider may also give you more specific instructions. If you have problems or questions, contact your health care provider. What can I expect after the procedure? After your procedure, it is common to have soreness near the incision site of your drainage tube (catheter). Follow these instructions at home: Incision care   Follow instructions from your health care provider about how to take care of your incision site where the catheter was inserted. Make sure you: ? Wash your hands with soap and water before and after you change your bandage (dressing). If soap and water are not available, use hand sanitizer. ? Change your dressing as told by your health care provider.  Check the incision site every day for signs of infection. Check for: ? Redness, swelling, or pain. ? Fluid or blood. ? Warmth. ? Pus or a bad smell.  Do not take baths, swim, or use a hot tub until your health care provider approves. Ask your health care provider if you may take showers. You may only be allowed to take sponge baths. General instructions  Follow instructions from your health care provider about how to care for your catheter and collection bag at home.  Your health care provider will show you: ? How to record the amount of drainage from the catheter. ? How to flush the catheter. ? How to care for the catheter incision site.  Follow instructions from your health care provider about eating or drinking restrictions.  Take over-the-counter and prescription medicines only as told by your health care provider.  Keep all follow-up visits as told by your health care provider. This is important. Contact a health care provider if:  You have redness, swelling, or pain around the catheter incision site.  You have nausea or vomiting. Get help right away if:  Your abdominal pain  gets worse.  You feel dizzy or you faint while standing.  You have fluid or blood coming from the catheter incision site.  The area around the catheter incision site feels warm to the touch.  You have pus or a bad smell coming from the catheter incision site.  You have a fever.  You have shortness of breath.  You have a rapid heartbeat.  Your nausea or vomiting does not go away.  Your catheter becomes blocked.  Your catheter comes out of your abdomen. Summary  After your procedure, it is common to have soreness near the incision site of your drainage tube (catheter).  Wash your hands with soap and water before and after you change your bandage (dressing). Change your dressing as told by your health care provider.  Check the catheter incision site every day for signs of infection. Check for redness, swelling, pain, fluid, blood, warmth, pus, or a bad smell.  Contact your health care provider if you have nausea or vomiting, or if you have redness, swelling, or pain around your catheter incision site.  Get help right away if your abdominal pain gets worse, you feel dizzy, you have blood or fluid coming from the catheter incision site, you have a fever, or you have shortness of breath. This information is not intended to replace advice given to you by your health care provider. Make sure you discuss any questions you have with your health care provider. Document Revised: 01/13/2018 Document Reviewed: 01/13/2018 Elsevier Patient Education  2020 [employer]  

## 2019-07-23 LAB — CBC WITH DIFFERENTIAL/PLATELET
Abs Immature Granulocytes: 0.22 10*3/uL — ABNORMAL HIGH (ref 0.00–0.07)
Basophils Absolute: 0.2 10*3/uL — ABNORMAL HIGH (ref 0.0–0.1)
Basophils Relative: 2 %
Eosinophils Absolute: 0.7 10*3/uL — ABNORMAL HIGH (ref 0.0–0.5)
Eosinophils Relative: 6 %
HCT: 31.3 % — ABNORMAL LOW (ref 36.0–46.0)
Hemoglobin: 9.7 g/dL — ABNORMAL LOW (ref 12.0–15.0)
Immature Granulocytes: 2 %
Lymphocytes Relative: 17 %
Lymphs Abs: 1.9 10*3/uL (ref 0.7–4.0)
MCH: 27 pg (ref 26.0–34.0)
MCHC: 31 g/dL (ref 30.0–36.0)
MCV: 87.2 fL (ref 80.0–100.0)
Monocytes Absolute: 0.9 10*3/uL (ref 0.1–1.0)
Monocytes Relative: 8 %
Neutro Abs: 7.3 10*3/uL (ref 1.7–7.7)
Neutrophils Relative %: 65 %
Platelets: 434 10*3/uL — ABNORMAL HIGH (ref 150–400)
RBC: 3.59 MIL/uL — ABNORMAL LOW (ref 3.87–5.11)
RDW: 16.8 % — ABNORMAL HIGH (ref 11.5–15.5)
WBC: 11.2 10*3/uL — ABNORMAL HIGH (ref 4.0–10.5)
nRBC: 0.3 % — ABNORMAL HIGH (ref 0.0–0.2)

## 2019-07-23 LAB — GLUCOSE, CAPILLARY
Glucose-Capillary: 116 mg/dL — ABNORMAL HIGH (ref 70–99)
Glucose-Capillary: 126 mg/dL — ABNORMAL HIGH (ref 70–99)
Glucose-Capillary: 130 mg/dL — ABNORMAL HIGH (ref 70–99)
Glucose-Capillary: 168 mg/dL — ABNORMAL HIGH (ref 70–99)
Glucose-Capillary: 51 mg/dL — ABNORMAL LOW (ref 70–99)
Glucose-Capillary: 81 mg/dL (ref 70–99)

## 2019-07-23 LAB — BASIC METABOLIC PANEL
Anion gap: 10 (ref 5–15)
BUN: 9 mg/dL (ref 6–20)
CO2: 26 mmol/L (ref 22–32)
Calcium: 9 mg/dL (ref 8.9–10.3)
Chloride: 110 mmol/L (ref 98–111)
Creatinine, Ser: 0.83 mg/dL (ref 0.44–1.00)
GFR calc Af Amer: >60 mL/min (ref >60)
GFR calc non Af Amer: >60 mL/min (ref >60)
Glucose, Bld: 81 mg/dL (ref 70–99)
Potassium: 4 mmol/L (ref 3.5–5.1)
Sodium: 146 mmol/L — ABNORMAL HIGH (ref 135–145)

## 2019-07-23 LAB — PHOSPHORUS: Phosphorus: 3.9 mg/dL (ref 2.5–4.6)

## 2019-07-23 LAB — MAGNESIUM: Magnesium: 2.2 mg/dL (ref 1.7–2.4)

## 2019-07-23 MED ORDER — TRAMADOL HCL 50 MG PO TABS: 50.0000 mg | ORAL_TABLET | Freq: Four times a day (QID) | ORAL | 0 refills | Status: AC | PRN

## 2019-07-23 MED ORDER — CEPHALEXIN 500 MG PO CAPS: 500.0000 mg | ORAL_CAPSULE | Freq: Three times a day (TID) | ORAL | 0 refills | Status: DC

## 2019-07-23 MED ORDER — ACETAMINOPHEN 325 MG PO TABS: 650.0000 mg | ORAL_TABLET | Freq: Four times a day (QID) | ORAL | Status: DC | PRN

## 2019-07-23 MED ORDER — METOPROLOL TARTRATE 75 MG PO TABS: 75.0000 mg | ORAL_TABLET | Freq: Two times a day (BID) | ORAL | Status: DC

## 2019-07-23 MED ORDER — LINEZOLID 600 MG PO TABS: 600.0000 mg | ORAL_TABLET | Freq: Two times a day (BID) | ORAL | 0 refills | Status: AC

## 2019-07-23 MED ORDER — LINEZOLID 600 MG PO TABS
600.0000 mg | ORAL_TABLET | Freq: Two times a day (BID) | ORAL | Status: DC
  Administered 2019-07-23: 600 mg via ORAL
  Filled 2019-07-23 (×2): qty 1

## 2019-07-23 MED ORDER — SODIUM CHLORIDE 0.9 % IV SOLN: 1.0000 g | Freq: Three times a day (TID) | INTRAVENOUS | Status: AC

## 2019-07-23 NOTE — TOC Transition Note (Signed)
Transition of Care [facility]) - CM/SW Discharge Note   Patient Details  Name: Shannon Garrett MRN: [id number] Date of Birth: Nov 25, 1960  Transition of Care [facility]) CM/SW Contact:  Dessa Phi, RN Phone Number: 07/23/2019, 10:48 AM   Clinical Narrative: d/c today per attending back to [facility] rm#[id number],nurse  Call report tel#[phone]-await d/c summary will fax. Will call [facility] when patient ready.      Final next level of care: Long Term Nursing Home Barriers to Discharge: No Barriers Identified   Patient Goals and CMS Choice Patient states their goals for this hospitalization and ongoing recovery are:: go to rehab      Discharge Placement              Patient chooses bed at: Other - please specify in the comment section below:([facility]) Patient to be transferred to facility by: [facility] Name of family member notified: Debra sister-left message w/call back tel# Patient and family notified of of transfer: 07/23/19  Discharge Plan and Services   Discharge Planning Services: CM Consult                                 Social Determinants of Health (SDOH) Interventions     Readmission Risk Interventions No flowsheet data found.

## 2019-07-23 NOTE — Progress Notes (Signed)
Referring Physician(s): [name]  Supervising Physician: Markus Daft  Patient Status:  [name] - In-pt  Chief Complaint: None  Subjective:  Acute calculus cholecystitis s/p percutaneous cholecystostomy tube placement in IR 07/21/2019 by Dr. Annamaria Boots. Patient awake and alert laying in bed with no complaints at this time. Cholecystostomy tube site c/d/i.   Allergies: Sulfonamide derivatives and Penicillins  Medications: Prior to Admission medications   Medication Sig Start Date End Date Taking? Authorizing Provider  acetaminophen (TYLENOL) 325 MG tablet Take 650 mg by mouth every 8 (eight) hours as needed for mild pain, moderate pain or headache.    Yes [provider]  albuterol (PROVENTIL) (2.5 MG/3ML) 0.083% nebulizer solution Take 3 mLs (2.5 mg total) by nebulization every 2 (two) hours as needed for wheezing. 11/10/15  Yes Regalado, Belkys A, MD  albuterol (VENTOLIN HFA) 108 (90 Base) MCG/ACT inhaler Inhale 2 puffs into the lungs every 6 (six) hours as needed for wheezing or shortness of breath.  07/10/19  Yes [provider]  amantadine (SYMMETREL) 100 MG capsule Take 100 mg by mouth daily.    Yes [provider]  Amino Acids-Protein Hydrolys (FEEDING SUPPLEMENT, PRO-STAT SUGAR FREE 64,) LIQD Take 30 mLs by mouth 3 (three) times daily with meals.   Yes [provider]  ascorbic acid (VITAMIN C) 500 MG tablet Take 500 mg by mouth daily.    Yes [provider]  aspirin 325 MG EC tablet Take 325 mg by mouth daily.   Yes [provider]  Baclofen 5 MG TABS Take 5 mg by mouth 4 (four) times daily. Hold if lethargic or confused  12/04/17  Yes [provider]  buPROPion (WELLBUTRIN XL) 150 MG 24 hr tablet Take 150 mg by mouth daily after breakfast.   Yes [provider]  carvedilol (COREG) 25 MG tablet Take 1 tablet (25 mg total) by mouth 2 (two) times daily with a meal. 07/25/15  Yes Rai, Ripudeep K, MD  CRANBERRY PO Take  100 mg by mouth at bedtime.    Yes [provider]  DULoxetine (CYMBALTA) 30 MG capsule Take 90 mg by mouth daily. 07/18/19  Yes [provider]  Ensure (ENSURE) Take 237 mLs by mouth 2 (two) times daily between meals.   Yes [provider]  famotidine (PEPCID) 20 MG tablet Take 1 tablet (20 mg total) by mouth 2 (two) times daily. 07/25/15  Yes Rai, Ripudeep K, MD  ferrous sulfate 325 (65 FE) MG tablet Take 325 mg by mouth 2 (two) times daily with a meal.  02/20/18  Yes [provider]  fluticasone (FLONASE) 50 MCG/ACT nasal spray Place 2 sprays into both nostrils every evening.   Yes [provider]  gabapentin (NEURONTIN) 800 MG tablet Take 800 mg by mouth 3 (three) times daily.   Yes [provider]  insulin aspart (NOVOLOG FLEXPEN) 100 UNIT/ML FlexPen 0-15 Units, Subcutaneous, 3 times daily with meals CBG < 70: implement hypoglycemia protocol-call MD CBG 70 - 120: 0 units CBG 121 - 150: 2 units CBG 151 - 200: 3 units CBG 201 - 250: 5 units CBG 251 - 300: 8 units CBG 301 - 350: 11 units CBG 351 - 400: 15 units CBG > 400: 05/18/19  Yes Ghimire, Henreitta Leber, MD  insulin glargine (LANTUS) 100 UNIT/ML injection Inject 0.1 mLs (10 Units total) into the skin daily. 05/19/19  Yes Ghimire, Henreitta Leber, MD  Lactobacillus (PROBIOTIC ACIDOPHILUS) CAPS Take 1 capsule by mouth daily.  Yes [provider]  latanoprost (XALATAN) 0.005 % ophthalmic solution Place 1 drop into both eyes at bedtime.   Yes [provider]  lubiprostone (AMITIZA) 24 MCG capsule Take 24 mcg by mouth 2 (two) times daily with a meal.   Yes [provider]  Melatonin 5 MG TABS Take 10 mg by mouth at bedtime.  11/14/17  Yes [provider]  Multiple Vitamins-Minerals (MULTIVITAMIN ADULT PO) Take 1 tablet by mouth daily.   Yes [provider]  omeprazole (PRILOSEC) 20 MG capsule Take 20 mg by mouth daily.   Yes [provider]    ondansetron (ZOFRAN) 4 MG tablet Take 4 mg by mouth every 6 (six) hours as needed for nausea or vomiting. 11/01/17  Yes [provider]  potassium chloride (KLOR-CON) 20 MEQ packet Take 20 mEq by mouth daily.    Yes [provider]  senna-docusate (SENOKOT S) 8.6-50 MG tablet Take 2 tablets by mouth 2 (two) times daily.    Yes [provider]  sodium phosphate Pediatric (FLEET) 3.5-9.5 GM/59ML enema Place 1 enema rectally once as needed for mild constipation, moderate constipation or severe constipation.   Yes [provider]  spironolactone (ALDACTONE) 25 MG tablet Take 25 mg by mouth daily. For CHF   Yes [provider]  tamsulosin (FLOMAX) 0.4 MG CAPS capsule Take 0.4 mg by mouth daily. For incontinence   Yes [provider]  torsemide (DEMADEX) 20 MG tablet Take 20 mg by mouth daily.    Yes [provider]  linezolid (ZYVOX) 600 MG tablet Take 1 tablet (600 mg total) by mouth every 12 (twelve) hours for 5 days. 07/23/19 07/28/19  Barb Merino, MD  meropenem 1 g in sodium chloride 0.9 % 100 mL Inject 1 g into the vein every 8 (eight) hours for 5 days. 07/23/19 07/28/19  Barb Merino, MD  metoprolol tartrate 75 MG TABS Take 75 mg by mouth 2 (two) times daily. 07/23/19   Barb Merino, MD  predniSONE (DELTASONE) 10 MG tablet Take 40 mg daily for 1 day, 30 mg daily for 1 day, 20 mg daily for 1 days,10 mg daily for 1 day, then stop Patient not taking: Reported on 07/20/2019 05/18/19   Jonetta Osgood, MD  traMADol (ULTRAM) 50 MG tablet Take 1 tablet (50 mg total) by mouth every 6 (six) hours as needed for up to 5 days for moderate pain or severe pain. 07/23/19 07/28/19  Barb Merino, MD     Vital Signs: BP 122/72   Pulse 80   Temp 97.8 F (36.6 C) (Oral)   Resp 18   Ht 5\' 3"  (1.6 m)   Wt 219 lb 5.7 oz (99.5 kg)   SpO2 98%   BMI 38.86 kg/m   Physical Exam Vitals and nursing note reviewed.  Constitutional:      General: She  is not in acute distress.    Appearance: Normal appearance.  Pulmonary:     Effort: Pulmonary effort is normal. No respiratory distress.  Abdominal:     Comments: Cholecystostomy tube site without tenderness, erythema, drainage, or active bleeding; approximately 25 cc blood-tinged bile in gravity bag.  Skin:    General: Skin is warm and dry.  Neurological:     Mental Status: She is alert and oriented to person, place, and time.  Psychiatric:        Mood and Affect: Mood normal.        Behavior: Behavior normal.  Imaging: IR Perc Cholecystostomy  Result Date: 07/21/2019 INDICATION: Acute calculus cholecystitis EXAM: ULTRASOUND AND FLUOROSCOPIC PERCUTANEOUS CHOLECYSTOSTOMY MEDICATIONS: Patient is already receiving as inpatient ANESTHESIA/SEDATION: Moderate (conscious) sedation was employed during this procedure. A total of Versed 0.0 mg and Fentanyl 100 mcg was administered intravenously. Moderate Sedation Time: 10 minutes. The patient's level of consciousness and vital signs were monitored continuously by radiology nursing throughout the procedure under my direct supervision. FLUOROSCOPY TIME:  Fluoroscopy Time: 1 minutes 12 seconds (22 mGy). COMPLICATIONS: None immediate. PROCEDURE: Informed written consent was obtained from the patient after a thorough discussion of the procedural risks, benefits and alternatives. All questions were addressed. Maximal Sterile Barrier Technique was utilized including caps, mask, sterile gowns, sterile gloves, sterile drape, hand hygiene and skin antiseptic. A timeout was performed prior to the initiation of the procedure. Previous imaging reviewed. Preliminary ultrasound. The distended gallbladder containing echogenic gallstones was localized in the mid axillary line along the subcostal margin. Under sterile conditions and local anesthesia, percutaneous needle access performed of the distended gallbladder fundus. Needle position confirmed with ultrasound. There  was return purulent bile. Sample sent culture. Guidewire advanced followed by the Accustick dilator set. Amplatz guidewire advanced followed by tract dilatation insert a 10 French drain. Drain catheter position confirmed with ultrasound and fluoroscopy. Syringe aspiration yielded 50 cc purulent bile. This decompressed gallbladder. Catheter secured with Prolene suture and a sterile dressing. Gravity drainage bag connected. Patient tolerated the procedure well. No immediate complication. IMPRESSION: Successful ultrasound and fluoroscopic percutaneous cholecystostomy. Electronically Signed   By: Jerilynn Mages.  Shick M.D.   On: 07/21/2019 14:44   DG Abd Portable 1V  Result Date: 07/22/2019 CLINICAL DATA:  Recent cholecystostomy EXAM: PORTABLE ABDOMEN - 1 VIEW COMPARISON:  None. FINDINGS: Scattered large and small bowel gas is noted. Mild retained fecal material is seen. No obstructive changes are noted. No free air is seen. Cholecystostomy tube is noted in the right upper quadrant. No bony abnormality is noted. IMPRESSION: No obstructive changes are seen. Electronically Signed   By: Inez Catalina M.D.   On: 07/22/2019 10:21   ECHOCARDIOGRAM COMPLETE  Result Date: 07/19/2019   ECHOCARDIOGRAM LIMITED REPORT   Patient Name:   Shannon Garrett Date of Exam: 07/19/2019 Medical Rec #:  [id number]     Height:       65.0 in Accession #:    [id number]    Weight:       264.6 lb Date of Birth:  1961-06-02     BSA:          2.23 m Patient Age:    59 years      BP:           132/73 mmHg Patient Gender: F             HR:           102 bpm. Exam Location:  Inpatient  Procedure: 2D Echo STAT ECHO Indications:    chest pain 786.50  History:        Patient has prior history of Echocardiogram examinations, most                 recent 09/20/2017.  Sonographer:    Johny Chess Referring Phys: [id number] ABRAHAM FELIZ ORTIZ  Sonographer Comments: Patient is morbidly obese. IMPRESSIONS  1. Left ventricular ejection fraction, by visual estimation, is 40  to 45%. The left ventricle has mildly decreased function. There is no increased left ventricular wall thickness.  2. Indeterminate diastolic filling due to  E-A fusion.  3. The left ventricle demonstrates global hypokinesis.  4. The right ventricle has normal systolic function.The right ventricular size is normal.  5. The mitral valve is normal in structure. Mild mitral valve regurgitation.  6. The tricuspid valve was normal in structure.  7. Tricuspid valve regurgitation is mild-moderate.  8. Moderately elevated pulmonary artery systolic pressure.  9. Compared to 2016 and 2019 reports, LV systolic function has deteriorated. Side by side comparison was not available. FINDINGS  Left Ventricle: Left ventricular ejection fraction, by visual estimation, is 40 to 45%. The left ventricle has mildly decreased function. The left ventricle demonstrates global hypokinesis. The left ventricular internal cavity size was the left ventricle is normal in size. There is no increased left ventricular wall thickness. Indeterminate diastolic filling due to E-A fusion. Right Ventricle: The right ventricular size is normal. No increase in right ventricular wall thickness. Global RV systolic function is has normal systolic function. The tricuspid regurgitant velocity is 3.31 m/s, and with an assumed right atrial pressure  of 10 mmHg, the estimated right ventricular systolic pressure is moderately elevated at 53.8 mmHg. Left Atrium: Left atrial size was normal. Right Atrium: Right atrial size was normal. Right atrial pressure is estimated at 10 mmHg. Pericardium: There is no evidence of pericardial effusion is seen. There is no evidence of pericardial effusion. Mitral Valve: The mitral valve is normal in structure. Mild mitral valve regurgitation. Tricuspid Valve: The tricuspid valve is normal in structure. Tricuspid valve regurgitation mild-moderate. Aortic Valve: The aortic valve is normal in structure. Aortic valve regurgitation is  mild. Pulmonic Valve: The pulmonic valve was not well visualized. Pulmonic valve regurgitation is not visualized by color flow Doppler. Pulmonic regurgitation is not visualized by color flow Doppler. Aorta: The aortic root, ascending aorta and aortic arch are all structurally normal, with no evidence of dilitation or obstruction. Shunts: No atrial level shunt detected by color flow Doppler.  LEFT VENTRICLE PLAX 2D LVIDd:         5.05 cm LVIDs:         3.98 cm LV PW:         0.90 cm LV IVS:        0.90 cm LV SV:         52 ml LV SV Index:   21.50  RIGHT VENTRICLE RV S prime:     12.70 cm/s TAPSE (M-mode): 1.2 cm LEFT ATRIUM             Index       RIGHT ATRIUM           Index LA diam:        3.50 cm 1.57 cm/m  RA Area:     13.10 cm LA Vol (A2C):   26.4 ml 11.85 ml/m RA Volume:   33.30 ml  14.95 ml/m LA Vol (A4C):   38.0 ml 17.06 ml/m LA Biplane Vol: 33.5 ml 15.04 ml/m  AORTIC VALVE LVOT Vmax:   89.40 cm/s LVOT Vmean:  61.400 cm/s LVOT VTI:    0.133 m  AORTA Ao Root diam: 2.70 cm TRICUSPID VALVE TR Peak grad:   43.8 mmHg TR Vmax:        331.00 cm/s  SHUNTS Systemic VTI: 0.13 m  Sanda Klein MD Electronically signed by Sanda Klein MD Signature Date/Time: 07/19/2019/7:02:08 PM    Final     Labs:  CBC: Recent Labs    07/19/19 0830 07/21/19 0425 07/22/19 0333 07/23/19 0511  WBC 15.1* 17.5*  15.6* 11.2*  HGB 11.1* 9.8* 10.4* 9.7*  HCT 33.9* 31.2* 33.3* 31.3*  PLT 482* 439* 493* 434*    COAGS: Recent Labs    05/10/19 1452 05/10/19 1956 05/12/19 0150 07/21/19 0425  INR 1.1 1.1 1.0 1.2  APTT 38*  --   --   --     BMP: Recent Labs    07/18/19 2151 07/20/19 0818 07/21/19 0425 07/23/19 0511  NA 139 143 143 146*  K 3.7 3.8 4.1 4.0  CL 100 108 109 110  CO2 28 27 24 26   GLUCOSE 163* 128* 150* 81  BUN 14 13 11 9   CALCIUM 9.2 8.2* 8.7* 9.0  CREATININE 1.16* 1.06* 0.90 0.83  GFRNONAA 53* 59* >60 >60  GFRAA >60 >60 >60 >60    LIVER FUNCTION TESTS: Recent Labs    05/18/19 0451  06/24/19 1602 07/18/19 2151 07/21/19 0425  BILITOT 0.6 1.4* 0.9 0.5  AST 51* 339* 25 16  ALT 80* 521* 41 26  ALKPHOS 255* 1,018* 433* 352*  PROT 6.1* 6.9 7.7 6.7  ALBUMIN 2.7* 3.2* 3.5 2.8*    Assessment and Plan:  Acute calculus cholecystitis s/p percutaneous cholecystostomy tube placement in IR 07/21/2019 by Dr. Annamaria Boots. Cholecystostomy tube stable with approximately 25 cc blood-tinged bile in gravity bag (additional 45 cc output in past 24 hours per chart). Continue current drain management- continue with Qshift flushes/monitor of output. Further plans per [facility]S- appreciate and agree with management.  If patient is to be discharged, below are discharge instructions: - Flush each drain once daily with 5-10 cc NS flush. - Record output from each drain once daily. - Follow-up at [facility] for cholangiogram with possible exchange in 6 weeks- order placed to facilitate this.  IR to follow.   Electronically Signed: Earley Abide, PA-C 07/23/2019, 12:28 PM   I spent a total of 25 Minutes at the the patient's bedside AND on the patient's hospital floor or unit, greater than 50% of which was counseling/coordinating care for acute calculus cholecystitis s/p cholecystostomy tube placement.

## 2019-07-23 NOTE — Progress Notes (Signed)
Received call from pt's sister stating that someone(staff) was talking about he sisters weight. Upon speaking with the pt. She shared many different stories about the situation. Initially she said that the nurse tech came in and called her a" fat bitch".. When asked if she knew the persons name she said "Shannon Garrett" I told her that I am Shannon Garrett. She then said it was a "white girl". The tech caring for her yesterday did not fit that description. She then point out pt that was walking dow the hall.. She said that she was leaving today and that she had not thought about it anymore. She said do not worry about it. Return call to patient sister [name] at [phone]. Discussed the above with her. She was appreciative of the follow up. CN and Unit Leadership aware.

## 2019-07-23 NOTE — Progress Notes (Signed)
Pt remains asymptomatic. Ate 100% of her breakfast. Will have tech retest blood sugar

## 2019-07-23 NOTE — Discharge Summary (Signed)
Physician Discharge Summary  Shannon Garrett [id number] DOB: February 16, 1961 DOA: 07/18/2019  PCP: Gildardo Cranker, DO  Admit date: 07/18/2019 Discharge date: 07/23/2019  Admitted From: Skilled nursing facility Disposition: Skilled nursing facility  Recommendations for Outpatient Follow-up:  1. Follow up with PCP in 1-2 weeks 2. Surgery to schedule follow-up  Discharge Condition: Stable CODE STATUS: DNR Diet recommendation: Low-carb diet  Discharge summary: This is new discharge summary with updated antibiotic recommendations from culture reports. 59 year old female with multiple chronic medical issues including hypertension, bedbound secondary to multiple sclerosis, chronic diastolic heart failure presented to the emergency room with right-sided abdominal pain, nausea and anorexia.  Recently had COVID-19 infection in November 2020.  Also has history of ESBL UTI and C. difficile.  In the emergency room right upper quadrant ultrasound showed acute calculus cholecystitis, started on antibiotics and admitted to the hospital.  Treated for following conditions:  #1. Sepsis present on admission, resolved, secondary to acute calculus cholecystitis: 1/18 taken to surgery for lap chole, that was unsuccessful because of severe inflammation. 1/19, IR guided percutaneous cholecystostomy drain placed.  Draining well. Blood cultures negative.   Bile cultures with ESBL E. coli and Enterococcus.   As per surgery, patient will need 10 days of antibiotics.  No oral substitution. IV meropenem 1 g 3 times a day for 5 more days.  Patient will need a midline. P.o. Zyvox 600 mg 2 times a day for 5 days.  Patient is on tramadol and SSRI, however antibiotic use only for 5 days.  Will have close monitoring for any adverse events.  plan for interval cholecystectomy in 4 to 6 weeks. Cholecystostomy tube care as per instructions.  Flushing with 5 mL of normal saline every day.  #2 .sinus tachycardia: Echocardiogram with  ejection fraction 45%.  Cardiology had seen the patient.  Currently on metoprolol with improvement.  #3 .insulin-dependent type 2 diabetes: A1c 7.6.  Currently on insulin regimen.    She had episode of hypoglycemia early morning but was asymptomatic.  Hold long-acting insulin until patient has good oral intake. Patient is currently eating full meal.  Patient ate 100% of the breakfast.  Blood sugars spontaneously improved.  #4 .advanced multiple sclerosis with bedbound status: Nonambulatory at baseline.  Long-term nursing home resident.  #5 .acute kidney injury: Improved.  #6 .stage II sacral decubitus ulcer present on admission: Local wound care.  Patient has adequate clinical improvement.  She wants to go back to the nursing home.  Seen by surgery today and they will schedule outpatient follow-up. Called patient's sister on the number provided, could not reach.  Discharge Diagnoses:  Principal Problem:   Acute calculous cholecystitis Active Problems:   MS (multiple sclerosis) (HCC)   Chronic diastolic CHF (congestive heart failure) (HCC)   Paraplegia (HCC)   Essential hypertension   Chronic pain syndrome   GERD (gastroesophageal reflux disease)   Sepsis (HCC)   Insulin dependent diabetes mellitus type IA ([place])   History of COVID-19 [id number]   History of Clostridioides difficile colitis   Multiple drug resistant organism (MDRO) Klebsiella pneumonia UTI culture positive    Discharge Instructions  Discharge Instructions    Call MD for:  persistant nausea and vomiting   Complete by: As directed    Call MD for:  redness, tenderness, or signs of infection (pain, swelling, redness, odor or green/yellow discharge around incision site)   Complete by: As directed    Call MD for:  severe uncontrolled pain   Complete by: As directed  Call MD for:  temperature >100.4   Complete by: As directed    Diet Carb Modified   Complete by: As directed    Discharge instructions   Complete  by: As directed    Flush the drain catheter with 5 cc of NS once daily   Increase activity slowly   Complete by: As directed      Allergies as of 07/23/2019      Reactions   Sulfonamide Derivatives Hives, Shortness Of Breath   Penicillins Hives, Itching   Has patient had a PCN reaction causing immediate rash, facial/tongue/throat swelling, SOB or lightheadedness with hypotension: unknown Has patient had a PCN reaction causing severe rash involving mucus membranes or skin necrosis: unknown Has patient had a PCN reaction that required hospitalization: unknown Has patient had a PCN reaction occurring within the last 10 years: unknown If all of the above answers are "NO", then may proceed with Cephalosporin use. Prior course of rocephin charted 05/2015      Medication List    STOP taking these medications   carvedilol 25 MG tablet Commonly known as: COREG   predniSONE 10 MG tablet Commonly known as: DELTASONE     TAKE these medications   acetaminophen 325 MG tablet Commonly known as: TYLENOL Take 650 mg by mouth every 8 (eight) hours as needed for mild pain, moderate pain or headache.   albuterol (2.5 MG/3ML) 0.083% nebulizer solution Commonly known as: PROVENTIL Take 3 mLs (2.5 mg total) by nebulization every 2 (two) hours as needed for wheezing.   albuterol 108 (90 Base) MCG/ACT inhaler Commonly known as: VENTOLIN HFA Inhale 2 puffs into the lungs every 6 (six) hours as needed for wheezing or shortness of breath.   amantadine 100 MG capsule Commonly known as: SYMMETREL Take 100 mg by mouth daily.   ascorbic acid 500 MG tablet Commonly known as: VITAMIN C Take 500 mg by mouth daily.   aspirin 325 MG EC tablet Take 325 mg by mouth daily.   Baclofen 5 MG Tabs Take 5 mg by mouth 4 (four) times daily. Hold if lethargic or confused   buPROPion 150 MG 24 hr tablet Commonly known as: WELLBUTRIN XL Take 150 mg by mouth daily after breakfast.   CRANBERRY PO Take 100 mg  by mouth at bedtime.   DULoxetine 30 MG capsule Commonly known as: CYMBALTA Take 90 mg by mouth daily.   Ensure Take 237 mLs by mouth 2 (two) times daily between meals.   famotidine 20 MG tablet Commonly known as: PEPCID Take 1 tablet (20 mg total) by mouth 2 (two) times daily.   feeding supplement (PRO-STAT SUGAR FREE 64) Liqd Take 30 mLs by mouth 3 (three) times daily with meals.   ferrous sulfate 325 (65 FE) MG tablet Take 325 mg by mouth 2 (two) times daily with a meal.   fluticasone 50 MCG/ACT nasal spray Commonly known as: FLONASE Place 2 sprays into both nostrils every evening.   gabapentin 800 MG tablet Commonly known as: NEURONTIN Take 800 mg by mouth 3 (three) times daily.   insulin glargine 100 UNIT/ML injection Commonly known as: LANTUS Inject 0.1 mLs (10 Units total) into the skin daily.   latanoprost 0.005 % ophthalmic solution Commonly known as: XALATAN Place 1 drop into both eyes at bedtime.   linezolid 600 MG tablet Commonly known as: ZYVOX Take 1 tablet (600 mg total) by mouth every 12 (twelve) hours for 5 days.   lubiprostone 24 MCG capsule Commonly known as:  AMITIZA Take 24 mcg by mouth 2 (two) times daily with a meal.   Melatonin 5 MG Tabs Take 10 mg by mouth at bedtime.   meropenem 1 g in sodium chloride 0.9 % 100 mL Inject 1 g into the vein every 8 (eight) hours for 5 days.   Metoprolol Tartrate 75 MG Tabs Take 75 mg by mouth 2 (two) times daily. What changed:   medication strength  how much to take   MULTIVITAMIN ADULT PO Take 1 tablet by mouth daily.   NovoLOG FlexPen 100 UNIT/ML FlexPen Generic drug: insulin aspart 0-15 Units, Subcutaneous, 3 times daily with meals CBG < 70: implement hypoglycemia protocol-call MD CBG 70 - 120: 0 units CBG 121 - 150: 2 units CBG 151 - 200: 3 units CBG 201 - 250: 5 units CBG 251 - 300: 8 units CBG 301 - 350: 11 units CBG 351 - 400: 15 units CBG > 400:   omeprazole 20 MG  capsule Commonly known as: PRILOSEC Take 20 mg by mouth daily.   ondansetron 4 MG tablet Commonly known as: ZOFRAN Take 4 mg by mouth every 6 (six) hours as needed for nausea or vomiting.   potassium chloride 20 MEQ packet Commonly known as: KLOR-CON Take 20 mEq by mouth daily.   Probiotic Acidophilus Caps Take 1 capsule by mouth daily.   Senokot S 8.6-50 MG tablet Generic drug: senna-docusate Take 2 tablets by mouth 2 (two) times daily.   sodium phosphate Pediatric 3.5-9.5 GM/59ML enema Place 1 enema rectally once as needed for mild constipation, moderate constipation or severe constipation.   spironolactone 25 MG tablet Commonly known as: ALDACTONE Take 25 mg by mouth daily. For CHF   tamsulosin 0.4 MG Caps capsule Commonly known as: FLOMAX Take 0.4 mg by mouth daily. For incontinence   torsemide 20 MG tablet Commonly known as: DEMADEX Take 20 mg by mouth daily.   traMADol 50 MG tablet Commonly known as: ULTRAM Take 1 tablet (50 mg total) by mouth every 6 (six) hours as needed for up to 5 days for moderate pain or severe pain. What changed: when to take this      Follow-up Information    Kroeger, Lorelee Cover., PA-C. Go on 08/19/2019.   Specialty: Physician Assistant Why: @1 :45pm for hosptial follow up with Dr. Doug Sou PA Contact information: [place] [address] [phone]        Clovis Riley, MD Follow up on 08/19/2019.   Specialty: General Surgery Why: 1:30 pm, arrive by 1:00pm for paperwork and check in process Contact information: [address] [place] [address] [phone]          Allergies  Allergen Reactions  . Sulfonamide Derivatives Hives and Shortness Of Breath  . Penicillins Hives and Itching    Has patient had a PCN reaction causing immediate rash, facial/tongue/throat swelling, SOB or lightheadedness with hypotension: unknown Has patient had a PCN reaction causing severe rash  involving mucus membranes or skin necrosis: unknown Has patient had a PCN reaction that required hospitalization: unknown Has patient had a PCN reaction occurring within the last 10 years: unknown If all of the above answers are "NO", then may proceed with Cephalosporin use. Prior course of rocephin charted 05/2015    Consultations:  Interventional radiology  Surgery   Procedures/Studies: CT Abdomen Pelvis W Contrast  Result Date: 06/29/2019 CLINICAL DATA:  Right-sided abdominal pain and recent C difficile infection EXAM: CT ABDOMEN AND PELVIS WITH CONTRAST  TECHNIQUE: Multidetector CT imaging of the abdomen and pelvis was performed using the standard protocol following bolus administration of intravenous contrast. CONTRAST:  167mL OMNIPAQUE IOHEXOL 300 MG/ML  SOLN COMPARISON:  05/25/2013 FINDINGS: Lower chest: Mild scarring is noted in the left lung base. Stable nodule in the left base unchanged from the prior exam. Hepatobiliary: Mild fatty infiltration of the liver is noted. The gallbladder is well distended. Few small gallstones are noted within the gallbladder stable from the previous exam. Pancreas: Unremarkable. No pancreatic ductal dilatation or surrounding inflammatory changes. Spleen: Normal in size without focal abnormality. Adrenals/Urinary Tract: Adrenal glands are within normal limits bilaterally. Scattered cysts are seen within the kidneys bilaterally. Normal excretion of contrast is noted. No obstructive changes are seen. The bladder is well distended. Stomach/Bowel: Appendix is well visualized within normal limits. Scattered minimal diverticular change of the colon is noted without evidence of diverticulitis. No small bowel abnormality is seen. The stomach is decompressed. Vascular/Lymphatic: Aortic atherosclerosis. No enlarged abdominal or pelvic lymph nodes. Reproductive: Calcified uterine fibroid is noted stable from the prior exam. Uterus is otherwise within normal limits. No  adnexal mass is seen. Other: No free fluid is noted.  No herniation is seen. Musculoskeletal: Degenerative changes of the lumbar spine are seen. Mild degenerative anterolisthesis of L4 on L5 is noted. This is stable from the prior exam. IMPRESSION: Stable cholelithiasis without complicating factors. Calcified uterine fibroids. Fatty infiltration of the liver. No acute abnormality noted. Electronically Signed   By: Inez Catalina M.D.   On: 06/29/2019 10:28   IR Perc Cholecystostomy  Result Date: 07/21/2019 INDICATION: Acute calculus cholecystitis EXAM: ULTRASOUND AND FLUOROSCOPIC PERCUTANEOUS CHOLECYSTOSTOMY MEDICATIONS: Patient is already receiving as inpatient ANESTHESIA/SEDATION: Moderate (conscious) sedation was employed during this procedure. A total of Versed 0.0 mg and Fentanyl 100 mcg was administered intravenously. Moderate Sedation Time: 10 minutes. The patient's level of consciousness and vital signs were monitored continuously by radiology nursing throughout the procedure under my direct supervision. FLUOROSCOPY TIME:  Fluoroscopy Time: 1 minutes 12 seconds (22 mGy). COMPLICATIONS: None immediate. PROCEDURE: Informed written consent was obtained from the patient after a thorough discussion of the procedural risks, benefits and alternatives. All questions were addressed. Maximal Sterile Barrier Technique was utilized including caps, mask, sterile gowns, sterile gloves, sterile drape, hand hygiene and skin antiseptic. A timeout was performed prior to the initiation of the procedure. Previous imaging reviewed. Preliminary ultrasound. The distended gallbladder containing echogenic gallstones was localized in the mid axillary line along the subcostal margin. Under sterile conditions and local anesthesia, percutaneous needle access performed of the distended gallbladder fundus. Needle position confirmed with ultrasound. There was return purulent bile. Sample sent culture. Guidewire advanced followed by the  Accustick dilator set. Amplatz guidewire advanced followed by tract dilatation insert a 10 French drain. Drain catheter position confirmed with ultrasound and fluoroscopy. Syringe aspiration yielded 50 cc purulent bile. This decompressed gallbladder. Catheter secured with Prolene suture and a sterile dressing. Gravity drainage bag connected. Patient tolerated the procedure well. No immediate complication. IMPRESSION: Successful ultrasound and fluoroscopic percutaneous cholecystostomy. Electronically Signed   By: Jerilynn Mages.  Shick M.D.   On: 07/21/2019 14:44   DG Abd Portable 1V  Result Date: 07/22/2019 CLINICAL DATA:  Recent cholecystostomy EXAM: PORTABLE ABDOMEN - 1 VIEW COMPARISON:  None. FINDINGS: Scattered large and small bowel gas is noted. Mild retained fecal material is seen. No obstructive changes are noted. No free air is seen. Cholecystostomy tube is noted in the right upper quadrant. No bony abnormality  is noted. IMPRESSION: No obstructive changes are seen. Electronically Signed   By: Inez Catalina M.D.   On: 07/22/2019 10:21   ECHOCARDIOGRAM COMPLETE  Result Date: 07/19/2019   ECHOCARDIOGRAM LIMITED REPORT   Patient Name:   Shannon Garrett Date of Exam: 07/19/2019 Medical Rec #:  [id number]     Height:       65.0 in Accession #:    [id number]    Weight:       264.6 lb Date of Birth:  02-07-1961     BSA:          2.23 m Patient Age:    21 years      BP:           132/73 mmHg Patient Gender: F             HR:           102 bpm. Exam Location:  Inpatient  Procedure: 2D Echo STAT ECHO Indications:    chest pain 786.50  History:        Patient has prior history of Echocardiogram examinations, most                 recent 09/20/2017.  Sonographer:    Johny Chess Referring Phys: [id number] ABRAHAM FELIZ ORTIZ  Sonographer Comments: Patient is morbidly obese. IMPRESSIONS  1. Left ventricular ejection fraction, by visual estimation, is 40 to 45%. The left ventricle has mildly decreased function. There is no increased  left ventricular wall thickness.  2. Indeterminate diastolic filling due to E-A fusion.  3. The left ventricle demonstrates global hypokinesis.  4. The right ventricle has normal systolic function.The right ventricular size is normal.  5. The mitral valve is normal in structure. Mild mitral valve regurgitation.  6. The tricuspid valve was normal in structure.  7. Tricuspid valve regurgitation is mild-moderate.  8. Moderately elevated pulmonary artery systolic pressure.  9. Compared to 2016 and 2019 reports, LV systolic function has deteriorated. Side by side comparison was not available. FINDINGS  Left Ventricle: Left ventricular ejection fraction, by visual estimation, is 40 to 45%. The left ventricle has mildly decreased function. The left ventricle demonstrates global hypokinesis. The left ventricular internal cavity size was the left ventricle is normal in size. There is no increased left ventricular wall thickness. Indeterminate diastolic filling due to E-A fusion. Right Ventricle: The right ventricular size is normal. No increase in right ventricular wall thickness. Global RV systolic function is has normal systolic function. The tricuspid regurgitant velocity is 3.31 m/s, and with an assumed right atrial pressure  of 10 mmHg, the estimated right ventricular systolic pressure is moderately elevated at 53.8 mmHg. Left Atrium: Left atrial size was normal. Right Atrium: Right atrial size was normal. Right atrial pressure is estimated at 10 mmHg. Pericardium: There is no evidence of pericardial effusion is seen. There is no evidence of pericardial effusion. Mitral Valve: The mitral valve is normal in structure. Mild mitral valve regurgitation. Tricuspid Valve: The tricuspid valve is normal in structure. Tricuspid valve regurgitation mild-moderate. Aortic Valve: The aortic valve is normal in structure. Aortic valve regurgitation is mild. Pulmonic Valve: The pulmonic valve was not well visualized. Pulmonic valve  regurgitation is not visualized by color flow Doppler. Pulmonic regurgitation is not visualized by color flow Doppler. Aorta: The aortic root, ascending aorta and aortic arch are all structurally normal, with no evidence of dilitation or obstruction. Shunts: No atrial level shunt detected by color flow Doppler.  LEFT VENTRICLE PLAX  2D LVIDd:         5.05 cm LVIDs:         3.98 cm LV PW:         0.90 cm LV IVS:        0.90 cm LV SV:         52 ml LV SV Index:   21.50  RIGHT VENTRICLE RV S prime:     12.70 cm/s TAPSE (M-mode): 1.2 cm LEFT ATRIUM             Index       RIGHT ATRIUM           Index LA diam:        3.50 cm 1.57 cm/m  RA Area:     13.10 cm LA Vol (A2C):   26.4 ml 11.85 ml/m RA Volume:   33.30 ml  14.95 ml/m LA Vol (A4C):   38.0 ml 17.06 ml/m LA Biplane Vol: 33.5 ml 15.04 ml/m  AORTIC VALVE LVOT Vmax:   89.40 cm/s LVOT Vmean:  61.400 cm/s LVOT VTI:    0.133 m  AORTA Ao Root diam: 2.70 cm TRICUSPID VALVE TR Peak grad:   43.8 mmHg TR Vmax:        331.00 cm/s  SHUNTS Systemic VTI: 0.13 m  Dani Gobble Croitoru MD Electronically signed by Sanda Klein MD Signature Date/Time: 07/19/2019/7:02:08 PM    Final    US Abdomen Limited RUQ  Result Date: 07/19/2019 CLINICAL DATA:  Right upper quadrant pain. Gallstones on recent CT. Elevated white blood cell count. EXAM: ULTRASOUND ABDOMEN LIMITED RIGHT UPPER QUADRANT COMPARISON:  Abdominal CT 06/29/2019 FINDINGS: Gallbladder: Distended with multiple intraluminal gallstones. Mild gallbladder wall thickening of 3-4 mm. No pericholecystic fluid. Positive sonographic Murphy sign noted by sonographer. Common bile duct: Diameter: 3 mm, normal. Liver: Heterogeneously increased in parenchymal echogenicity. The liver parenchyma is difficult to penetrate. No evidence of focal lesion. Portal vein is patent on color Doppler imaging with normal direction of blood flow towards the liver. Other: None. IMPRESSION: 1. Gallstones with mild gallbladder wall thickening and positive  sonographic Murphy sign. Findings are consistent with acute cholecystitis in the appropriate clinical setting. 2. No biliary dilatation. 3. Hepatic steatosis. Electronically Signed   By: Keith Rake M.D.   On: 07/19/2019 00:50     Subjective: Patient seen and examined.  No overnight events.  She had some periods of confusion yesterday and that improved.  Patient says that she has some pain on her tube site.  Able to eat.  She feels more comfortable at the nursing home and she wants to go there today.   Discharge Exam: Vitals:   07/23/19 0432 07/23/19 1109  BP: 127/70 122/72  Pulse: 77 80  Resp: 18   Temp: 97.8 F (36.6 C)   SpO2: 98%    Vitals:   07/22/19 1306 07/22/19 2053 07/23/19 0432 07/23/19 1109  BP: 126/83 (!) 142/74 127/70 122/72  Pulse: (!) 107 (!) 101 77 80  Resp:  18 18   Temp:  98.4 F (36.9 C) 97.8 F (36.6 C)   TempSrc:  Oral Oral   SpO2:  94% 98%   Weight:      Height:        General: Pt is alert, awake, not in acute distress, on room air Cardiovascular: RRR, S1/S2 +, no rubs, no gallops Respiratory: CTA bilaterally, no wheezing, no rhonchi Abdominal: Soft, ND, bowel sounds +, right upper quadrant cholecystostomy tube, draining bile freely.  Minimal tenderness. Extremities: no edema, no cyanosis Patient has normal power on right upper extremity.  Left upper extremity and bilateral lower extremity has very minimal motor function 1-2/5.   The results of significant diagnostics from this hospitalization (including imaging, microbiology, ancillary and laboratory) are listed below for reference.     Microbiology: Recent Results (from the past 240 hour(s))  Surgical pcr screen     Status: Abnormal   Collection Time: 07/20/19 10:45 AM   Specimen: Nasal Mucosa; Nasal Swab  Result Value Ref Range Status   MRSA, PCR POSITIVE (A) NEGATIVE Final    Comment: RESULT CALLED TO, READ BACK BY AND VERIFIED WITH: NELSON,L. RN @1529  07/20/19 BILLINGSLEY,L     Staphylococcus aureus POSITIVE (A) NEGATIVE Final    Comment: (NOTE) The Xpert SA Assay (FDA approved for NASAL specimens in patients 53 years of age and older), is one component of a comprehensive surveillance program. It is not intended to diagnose infection nor to guide or monitor treatment. Performed at [facility], [place] [address] [place], [place] [address]   Aerobic/Anaerobic Culture (surgical/deep wound)     Status: None (Preliminary result)   Collection Time: 07/21/19 12:43 PM   Specimen: Gallbladder; Abscess  Result Value Ref Range Status   Specimen Description   Final    GALL BLADDER ABSCESS Performed at [facility], [address], [place], [place] [address]    Special Requests   Final    Normal Performed at [facility] [address], [place], [place] [address]    Gram Stain   Final    ABUNDANT WBC PRESENT, PREDOMINANTLY PMN ABUNDANT GRAM POSITIVE COCCI FEW GRAM NEGATIVE RODS    Culture   Final    MODERATE ENTEROCOCCUS FAECIUM FEW ESCHERICHIA COLI Confirmed Extended Spectrum Beta-Lactamase Producer (ESBL).  In bloodstream infections from ESBL organisms, carbapenems are preferred over piperacillin/tazobactam. They are shown to have a lower risk of mortality. HOLDING FOR POSSIBLE ANAEROBE Performed at [facility], [place] [address], [place],  [address]    Report Status PENDING  Incomplete   Organism ID, Bacteria ENTEROCOCCUS FAECIUM  Final   Organism ID, Bacteria ESCHERICHIA COLI  Final      Susceptibility   Escherichia coli - MIC*    AMPICILLIN >=32 RESISTANT Resistant     CEFAZOLIN >=64 RESISTANT Resistant     CEFEPIME >=32 RESISTANT Resistant     CEFTAZIDIME >=64 RESISTANT Resistant     CEFTRIAXONE >=64 RESISTANT Resistant     CIPROFLOXACIN >=4 RESISTANT Resistant     GENTAMICIN <=1 SENSITIVE Sensitive     IMIPENEM <=0.25 SENSITIVE Sensitive     TRIMETH/SULFA <=20 SENSITIVE Sensitive      AMPICILLIN/SULBACTAM >=32 RESISTANT Resistant     PIP/TAZO >=128 RESISTANT Resistant     * FEW ESCHERICHIA COLI   Enterococcus faecium - MIC*    AMPICILLIN >=32 RESISTANT Resistant     VANCOMYCIN <=0.5 SENSITIVE Sensitive     GENTAMICIN SYNERGY SENSITIVE Sensitive     * MODERATE ENTEROCOCCUS FAECIUM     Labs: BNP (last 3 results) Recent Labs    05/13/19 0200 05/14/19 0512 05/15/19 0407  BNP 638.0* 697.0* 123456*   Basic Metabolic Panel: Recent Labs  Lab 07/18/19 2151 07/20/19 0818 07/21/19 0425 07/23/19 0511  NA 139 143 143 146*  K 3.7 3.8 4.1 4.0  CL 100 108 109 110  CO2 28 27 24 26   GLUCOSE 163* 128* 150* 81  BUN 14 13 11  9  CREATININE 1.16* 1.06* 0.90 0.83  CALCIUM 9.2 8.2* 8.7* 9.0  MG  --   --   --  2.2  PHOS  --   --   --  3.9   Liver Function Tests: Recent Labs  Lab 07/18/19 2151 07/21/19 0425  AST 25 16  ALT 41 26  ALKPHOS 433* 352*  BILITOT 0.9 0.5  PROT 7.7 6.7  ALBUMIN 3.5 2.8*   Recent Labs  Lab 07/18/19 2151  LIPASE 14   No results for input(s): AMMONIA in the last 168 hours. CBC: Recent Labs  Lab 07/18/19 2151 07/19/19 0830 07/21/19 0425 07/22/19 0333 07/23/19 0511  WBC 13.7* 15.1* 17.5* 15.6* 11.2*  NEUTROABS  --  11.7*  --   --  7.3  HGB 11.4* 11.1* 9.8* 10.4* 9.7*  HCT 35.3* 33.9* 31.2* 33.3* 31.3*  MCV 85.7 85.0 87.9 87.4 87.2  PLT 471* 482* 439* 493* 434*   Cardiac Enzymes: No results for input(s): CKTOTAL, CKMB, CKMBINDEX, TROPONINI in the last 168 hours. BNP: Invalid input(s): POCBNP CBG: Recent Labs  Lab 07/23/19 0016 07/23/19 0428 07/23/19 0758 07/23/19 1035 07/23/19 1206  GLUCAP 130* 81 51* 116* 168*   D-Dimer No results for input(s): DDIMER in the last 72 hours. Hgb A1c No results for input(s): HGBA1C in the last 72 hours. Lipid Profile No results for input(s): CHOL, HDL, LDLCALC, TRIG, CHOLHDL, LDLDIRECT in the last 72 hours. Thyroid function studies No results for input(s): TSH, T4TOTAL, T3FREE,  THYROIDAB in the last 72 hours.  Invalid input(s): FREET3 Anemia work up No results for input(s): VITAMINB12, FOLATE, FERRITIN, TIBC, IRON, RETICCTPCT in the last 72 hours. Urinalysis    Component Value Date/Time   COLORURINE YELLOW 07/19/2019 1030   APPEARANCEUR CLOUDY (A) 07/19/2019 1030   LABSPEC 1.016 07/19/2019 1030   PHURINE 5.0 07/19/2019 1030   GLUCOSEU NEGATIVE 07/19/2019 1030   HGBUR NEGATIVE 07/19/2019 1030   BILIRUBINUR NEGATIVE 07/19/2019 1030   KETONESUR NEGATIVE 07/19/2019 1030   PROTEINUR NEGATIVE 07/19/2019 1030   UROBILINOGEN 1.0 01/10/2015 2311   NITRITE NEGATIVE 07/19/2019 1030   LEUKOCYTESUR LARGE (A) 07/19/2019 1030   Sepsis Labs Invalid input(s): PROCALCITONIN,  WBC,  LACTICIDVEN Microbiology Recent Results (from the past 240 hour(s))  Surgical pcr screen     Status: Abnormal   Collection Time: 07/20/19 10:45 AM   Specimen: Nasal Mucosa; Nasal Swab  Result Value Ref Range Status   MRSA, PCR POSITIVE (A) NEGATIVE Final    Comment: RESULT CALLED TO, READ BACK BY AND VERIFIED WITH: NELSON,L. RN @1529  07/20/19 BILLINGSLEY,L    Staphylococcus aureus POSITIVE (A) NEGATIVE Final    Comment: (NOTE) The Xpert SA Assay (FDA approved for NASAL specimens in patients 4 years of age and older), is one component of a comprehensive surveillance program. It is not intended to diagnose infection nor to guide or monitor treatment. Performed at [facility], [place] [address] [place], [place] [address]   Aerobic/Anaerobic Culture (surgical/deep wound)     Status: None (Preliminary result)   Collection Time: 07/21/19 12:43 PM   Specimen: Gallbladder; Abscess  Result Value Ref Range Status   Specimen Description   Final    GALL BLADDER ABSCESS Performed at [facility], [address] [place], [place] [address]    Special Requests   Final    Normal Performed at [facility] [address], [place], [place] [address]     Gram Stain   Final  ABUNDANT WBC PRESENT, PREDOMINANTLY PMN ABUNDANT GRAM POSITIVE COCCI FEW GRAM NEGATIVE RODS    Culture   Final    MODERATE ENTEROCOCCUS FAECIUM FEW ESCHERICHIA COLI Confirmed Extended Spectrum Beta-Lactamase Producer (ESBL).  In bloodstream infections from ESBL organisms, carbapenems are preferred over piperacillin/tazobactam. They are shown to have a lower risk of mortality. HOLDING FOR POSSIBLE ANAEROBE Performed at [facility], [place] [address], [place], [place] [address]    Report Status PENDING  Incomplete   Organism ID, Bacteria ENTEROCOCCUS FAECIUM  Final   Organism ID, Bacteria ESCHERICHIA COLI  Final      Susceptibility   Escherichia coli - MIC*    AMPICILLIN >=32 RESISTANT Resistant     CEFAZOLIN >=64 RESISTANT Resistant     CEFEPIME >=32 RESISTANT Resistant     CEFTAZIDIME >=64 RESISTANT Resistant     CEFTRIAXONE >=64 RESISTANT Resistant     CIPROFLOXACIN >=4 RESISTANT Resistant     GENTAMICIN <=1 SENSITIVE Sensitive     IMIPENEM <=0.25 SENSITIVE Sensitive     TRIMETH/SULFA <=20 SENSITIVE Sensitive     AMPICILLIN/SULBACTAM >=32 RESISTANT Resistant     PIP/TAZO >=128 RESISTANT Resistant     * FEW ESCHERICHIA COLI   Enterococcus faecium - MIC*    AMPICILLIN >=32 RESISTANT Resistant     VANCOMYCIN <=0.5 SENSITIVE Sensitive     GENTAMICIN SYNERGY SENSITIVE Sensitive     * MODERATE ENTEROCOCCUS FAECIUM     Time coordinating discharge:  45 minutes  SIGNED:   Barb Merino, MD  [facility] 07/23/2019, 12:28 PM

## 2019-07-23 NOTE — TOC Progression Note (Signed)
Transition of Care [facility]) - Progression Note    Patient Details  Name: Shannon Garrett MRN: [id number] Date of Birth: 1961-01-10  [facility] CM/SW Contact  Sharnika Binney, Juliann Pulse, RN Phone Number: 07/23/2019, 12:10 PM  Clinical Narrative: ESBL-iv abx.For long term iv abx-meropenem x5days, zyvoxx po x 5days-spoke to [facility]-LTC rep Alison-able to provide iv abx,& zyvoxx. Awaiting  Midline, & new d/c summary for return to [facility].Will call [facility] @ d/c.     Expected Discharge Plan: Long Term Nursing Home Barriers to Discharge: Continued Medical Work up  Expected Discharge Plan and Services Expected Discharge Plan: [facility]   Discharge Planning Services: CM Consult   Living arrangements for the past 2 months: (LTC) Expected Discharge Date: 07/23/19                                     Social Determinants of Health (SDOH) Interventions    Readmission Risk Interventions No flowsheet data found.

## 2019-07-23 NOTE — Discharge Summary (Signed)
Physician Discharge Summary  Shannon Garrett [id number] DOB: 1960/11/29 DOA: 07/18/2019  PCP: Gildardo Cranker, DO  Admit date: 07/18/2019 Discharge date: 07/23/2019  Admitted From: Skilled nursing facility Disposition: Skilled nursing facility  Recommendations for Outpatient Follow-up:  1. Follow up with PCP in 1-2 weeks 2. Surgery to schedule follow-up  Discharge Condition: Stable CODE STATUS: DNR Diet recommendation: Low-carb diet  Discharge summary: 59 year old female with multiple chronic medical issues including hypertension, bedbound secondary to multiple sclerosis, chronic diastolic heart failure presented to the emergency room with right-sided abdominal pain, nausea and anorexia.  Recently had COVID-19 infection in November 2020.  Also has history of ESBL UTI and C. difficile.  In the emergency room right upper quadrant ultrasound showed acute calculus cholecystitis, started on antibiotics and admitted to the hospital.  Treated for following conditions:  #1. Sepsis present on admission, resolved, secondary to acute calculus cholecystitis: 1/18 taken to surgery for lap chole, that was unsuccessful because of severe inflammation. 1/19, IR guided percutaneous cholecystostomy drain placed.  Draining well. Blood cultures negative.  Bile cultures with E. Coli.   With primary source controlled, patient received 5 days of meropenem in the hospital, will continue 5 more days of Keflex.  Previously used Rocephin. As per surgical plan, patient will be discharged with oral antibiotics, plan for interval cholecystectomy in 4 to 6 weeks. Cholecystostomy tube care as per instructions.  Flushing with 5 mL of normal saline every day.  #2 .sinus tachycardia: Echocardiogram with ejection fraction 45%.  Cardiology had seen the patient.  Currently on metoprolol with improvement.  #3 .insulin-dependent type 2 diabetes: A1c 7.6.  Currently on insulin regimen.    She had episode of hypoglycemia early  morning but was asymptomatic.  Hold long-acting insulin until patient has good oral intake. Patient is currently eating full meal.  Patient ate 100% of the breakfast.  Blood sugars spontaneously improved.  #4 .advanced multiple sclerosis with bedbound status: Nonambulatory at baseline.  Long-term nursing home resident.  #5 .acute kidney injury: Improved.  #6 .stage II sacral decubitus ulcer present on admission: Local wound care.  Patient has adequate clinical improvement.  She wants to go back to the nursing home.  Seen by surgery today and they will schedule outpatient follow-up. Called patient's sister on the number provided, could not reach.  Discharge Diagnoses:  Principal Problem:   Acute calculous cholecystitis Active Problems:   MS (multiple sclerosis) (HCC)   Chronic diastolic CHF (congestive heart failure) (HCC)   Paraplegia (HCC)   Essential hypertension   Chronic pain syndrome   GERD (gastroesophageal reflux disease)   Sepsis (HCC)   Insulin dependent diabetes mellitus type IA ([place])   History of COVID-19 [id number]   History of Clostridioides difficile colitis   Multiple drug resistant organism (MDRO) Klebsiella pneumonia UTI culture positive    Discharge Instructions  Discharge Instructions    Call MD for:  persistant nausea and vomiting   Complete by: As directed    Call MD for:  redness, tenderness, or signs of infection (pain, swelling, redness, odor or green/yellow discharge around incision site)   Complete by: As directed    Call MD for:  severe uncontrolled pain   Complete by: As directed    Call MD for:  temperature >100.4   Complete by: As directed    Diet Carb Modified   Complete by: As directed    Discharge instructions   Complete by: As directed    Flush the drain catheter with 5 cc of NS  once daily   Increase activity slowly   Complete by: As directed      Allergies as of 07/23/2019      Reactions   Sulfonamide Derivatives Hives, Shortness  Of Breath   Penicillins Hives, Itching   Has patient had a PCN reaction causing immediate rash, facial/tongue/throat swelling, SOB or lightheadedness with hypotension: unknown Has patient had a PCN reaction causing severe rash involving mucus membranes or skin necrosis: unknown Has patient had a PCN reaction that required hospitalization: unknown Has patient had a PCN reaction occurring within the last 10 years: unknown If all of the above answers are "NO", then may proceed with Cephalosporin use. Prior course of rocephin charted 05/2015      Medication List    STOP taking these medications   carvedilol 25 MG tablet Commonly known as: COREG   predniSONE 10 MG tablet Commonly known as: DELTASONE     TAKE these medications   acetaminophen 325 MG tablet Commonly known as: TYLENOL Take 650 mg by mouth every 8 (eight) hours as needed for mild pain, moderate pain or headache.   albuterol (2.5 MG/3ML) 0.083% nebulizer solution Commonly known as: PROVENTIL Take 3 mLs (2.5 mg total) by nebulization every 2 (two) hours as needed for wheezing.   albuterol 108 (90 Base) MCG/ACT inhaler Commonly known as: VENTOLIN HFA Inhale 2 puffs into the lungs every 6 (six) hours as needed for wheezing or shortness of breath.   amantadine 100 MG capsule Commonly known as: SYMMETREL Take 100 mg by mouth daily.   ascorbic acid 500 MG tablet Commonly known as: VITAMIN C Take 500 mg by mouth daily.   aspirin 325 MG EC tablet Take 325 mg by mouth daily.   Baclofen 5 MG Tabs Take 5 mg by mouth 4 (four) times daily. Hold if lethargic or confused   buPROPion 150 MG 24 hr tablet Commonly known as: WELLBUTRIN XL Take 150 mg by mouth daily after breakfast.   cephALEXin 500 MG capsule Commonly known as: KEFLEX Take 1 capsule (500 mg total) by mouth 3 (three) times daily for 5 days.   CRANBERRY PO Take 100 mg by mouth at bedtime.   DULoxetine 30 MG capsule Commonly known as: CYMBALTA Take 90 mg  by mouth daily.   Ensure Take 237 mLs by mouth 2 (two) times daily between meals.   famotidine 20 MG tablet Commonly known as: PEPCID Take 1 tablet (20 mg total) by mouth 2 (two) times daily.   feeding supplement (PRO-STAT SUGAR FREE 64) Liqd Take 30 mLs by mouth 3 (three) times daily with meals.   ferrous sulfate 325 (65 FE) MG tablet Take 325 mg by mouth 2 (two) times daily with a meal.   fluticasone 50 MCG/ACT nasal spray Commonly known as: FLONASE Place 2 sprays into both nostrils every evening.   gabapentin 800 MG tablet Commonly known as: NEURONTIN Take 800 mg by mouth 3 (three) times daily.   insulin glargine 100 UNIT/ML injection Commonly known as: LANTUS Inject 0.1 mLs (10 Units total) into the skin daily.   latanoprost 0.005 % ophthalmic solution Commonly known as: XALATAN Place 1 drop into both eyes at bedtime.   lubiprostone 24 MCG capsule Commonly known as: AMITIZA Take 24 mcg by mouth 2 (two) times daily with a meal.   Melatonin 5 MG Tabs Take 10 mg by mouth at bedtime.   Metoprolol Tartrate 75 MG Tabs Take 75 mg by mouth 2 (two) times daily. What changed:   medication  strength  how much to take   MULTIVITAMIN ADULT PO Take 1 tablet by mouth daily.   NovoLOG FlexPen 100 UNIT/ML FlexPen Generic drug: insulin aspart 0-15 Units, Subcutaneous, 3 times daily with meals CBG < 70: implement hypoglycemia protocol-call MD CBG 70 - 120: 0 units CBG 121 - 150: 2 units CBG 151 - 200: 3 units CBG 201 - 250: 5 units CBG 251 - 300: 8 units CBG 301 - 350: 11 units CBG 351 - 400: 15 units CBG > 400:   omeprazole 20 MG capsule Commonly known as: PRILOSEC Take 20 mg by mouth daily.   ondansetron 4 MG tablet Commonly known as: ZOFRAN Take 4 mg by mouth every 6 (six) hours as needed for nausea or vomiting.   potassium chloride 20 MEQ packet Commonly known as: KLOR-CON Take 20 mEq by mouth daily.   Probiotic Acidophilus Caps Take 1 capsule by mouth  daily.   Senokot S 8.6-50 MG tablet Generic drug: senna-docusate Take 2 tablets by mouth 2 (two) times daily.   sodium phosphate Pediatric 3.5-9.5 GM/59ML enema Place 1 enema rectally once as needed for mild constipation, moderate constipation or severe constipation.   spironolactone 25 MG tablet Commonly known as: ALDACTONE Take 25 mg by mouth daily. For CHF   tamsulosin 0.4 MG Caps capsule Commonly known as: FLOMAX Take 0.4 mg by mouth daily. For incontinence   torsemide 20 MG tablet Commonly known as: DEMADEX Take 20 mg by mouth daily.   traMADol 50 MG tablet Commonly known as: ULTRAM Take 1 tablet (50 mg total) by mouth every 6 (six) hours as needed for up to 5 days for moderate pain or severe pain. What changed: when to take this      Follow-up Information    Kroeger, Lorelee Cover., PA-C. Go on 08/19/2019.   Specialty: Physician Assistant Why: @1 :45pm for hosptial follow up with Dr. Doug Sou PA Contact information: [place] [address] [phone]        Clovis Riley, MD Follow up on 08/19/2019.   Specialty: General Surgery Why: 1:30 pm, arrive by 1:00pm for paperwork and check in process Contact information: [address] [place] [address] [phone]          Allergies  Allergen Reactions  . Sulfonamide Derivatives Hives and Shortness Of Breath  . Penicillins Hives and Itching    Has patient had a PCN reaction causing immediate rash, facial/tongue/throat swelling, SOB or lightheadedness with hypotension: unknown Has patient had a PCN reaction causing severe rash involving mucus membranes or skin necrosis: unknown Has patient had a PCN reaction that required hospitalization: unknown Has patient had a PCN reaction occurring within the last 10 years: unknown If all of the above answers are "NO", then may proceed with Cephalosporin use. Prior course of rocephin charted 05/2015     Consultations:  Interventional radiology  Surgery   Procedures/Studies: CT Abdomen Pelvis W Contrast  Result Date: 06/29/2019 CLINICAL DATA:  Right-sided abdominal pain and recent C difficile infection EXAM: CT ABDOMEN AND PELVIS WITH CONTRAST TECHNIQUE: Multidetector CT imaging of the abdomen and pelvis was performed using the standard protocol following bolus administration of intravenous contrast. CONTRAST:  157mL OMNIPAQUE IOHEXOL 300 MG/ML  SOLN COMPARISON:  05/25/2013 FINDINGS: Lower chest: Mild scarring is noted in the left lung base. Stable nodule in the left base unchanged from the prior exam. Hepatobiliary: Mild fatty infiltration of the liver is noted. The gallbladder is well distended. Few small  gallstones are noted within the gallbladder stable from the previous exam. Pancreas: Unremarkable. No pancreatic ductal dilatation or surrounding inflammatory changes. Spleen: Normal in size without focal abnormality. Adrenals/Urinary Tract: Adrenal glands are within normal limits bilaterally. Scattered cysts are seen within the kidneys bilaterally. Normal excretion of contrast is noted. No obstructive changes are seen. The bladder is well distended. Stomach/Bowel: Appendix is well visualized within normal limits. Scattered minimal diverticular change of the colon is noted without evidence of diverticulitis. No small bowel abnormality is seen. The stomach is decompressed. Vascular/Lymphatic: Aortic atherosclerosis. No enlarged abdominal or pelvic lymph nodes. Reproductive: Calcified uterine fibroid is noted stable from the prior exam. Uterus is otherwise within normal limits. No adnexal mass is seen. Other: No free fluid is noted.  No herniation is seen. Musculoskeletal: Degenerative changes of the lumbar spine are seen. Mild degenerative anterolisthesis of L4 on L5 is noted. This is stable from the prior exam. IMPRESSION: Stable cholelithiasis without complicating factors. Calcified uterine  fibroids. Fatty infiltration of the liver. No acute abnormality noted. Electronically Signed   By: Inez Catalina M.D.   On: 06/29/2019 10:28   IR Perc Cholecystostomy  Result Date: 07/21/2019 INDICATION: Acute calculus cholecystitis EXAM: ULTRASOUND AND FLUOROSCOPIC PERCUTANEOUS CHOLECYSTOSTOMY MEDICATIONS: Patient is already receiving as inpatient ANESTHESIA/SEDATION: Moderate (conscious) sedation was employed during this procedure. A total of Versed 0.0 mg and Fentanyl 100 mcg was administered intravenously. Moderate Sedation Time: 10 minutes. The patient's level of consciousness and vital signs were monitored continuously by radiology nursing throughout the procedure under my direct supervision. FLUOROSCOPY TIME:  Fluoroscopy Time: 1 minutes 12 seconds (22 mGy). COMPLICATIONS: None immediate. PROCEDURE: Informed written consent was obtained from the patient after a thorough discussion of the procedural risks, benefits and alternatives. All questions were addressed. Maximal Sterile Barrier Technique was utilized including caps, mask, sterile gowns, sterile gloves, sterile drape, hand hygiene and skin antiseptic. A timeout was performed prior to the initiation of the procedure. Previous imaging reviewed. Preliminary ultrasound. The distended gallbladder containing echogenic gallstones was localized in the mid axillary line along the subcostal margin. Under sterile conditions and local anesthesia, percutaneous needle access performed of the distended gallbladder fundus. Needle position confirmed with ultrasound. There was return purulent bile. Sample sent culture. Guidewire advanced followed by the Accustick dilator set. Amplatz guidewire advanced followed by tract dilatation insert a 10 French drain. Drain catheter position confirmed with ultrasound and fluoroscopy. Syringe aspiration yielded 50 cc purulent bile. This decompressed gallbladder. Catheter secured with Prolene suture and a sterile dressing. Gravity  drainage bag connected. Patient tolerated the procedure well. No immediate complication. IMPRESSION: Successful ultrasound and fluoroscopic percutaneous cholecystostomy. Electronically Signed   By: Jerilynn Mages.  Shick M.D.   On: 07/21/2019 14:44   DG Abd Portable 1V  Result Date: 07/22/2019 CLINICAL DATA:  Recent cholecystostomy EXAM: PORTABLE ABDOMEN - 1 VIEW COMPARISON:  None. FINDINGS: Scattered large and small bowel gas is noted. Mild retained fecal material is seen. No obstructive changes are noted. No free air is seen. Cholecystostomy tube is noted in the right upper quadrant. No bony abnormality is noted. IMPRESSION: No obstructive changes are seen. Electronically Signed   By: Inez Catalina M.D.   On: 07/22/2019 10:21   ECHOCARDIOGRAM COMPLETE  Result Date: 07/19/2019   ECHOCARDIOGRAM LIMITED REPORT   Patient Name:   LAMISHA LAVIGNE Date of Exam: 07/19/2019 Medical Rec #:  [id number]     Height:       65.0 in Accession #:    [id number]  Weight:       264.6 lb Date of Birth:  08-31-1960     BSA:          2.23 m Patient Age:    47 years      BP:           132/73 mmHg Patient Gender: F             HR:           102 bpm. Exam Location:  Inpatient  Procedure: 2D Echo STAT ECHO Indications:    chest pain 786.50  History:        Patient has prior history of Echocardiogram examinations, most                 recent 09/20/2017.  Sonographer:    Johny Chess Referring Phys: [id number] ABRAHAM FELIZ ORTIZ  Sonographer Comments: Patient is morbidly obese. IMPRESSIONS  1. Left ventricular ejection fraction, by visual estimation, is 40 to 45%. The left ventricle has mildly decreased function. There is no increased left ventricular wall thickness.  2. Indeterminate diastolic filling due to E-A fusion.  3. The left ventricle demonstrates global hypokinesis.  4. The right ventricle has normal systolic function.The right ventricular size is normal.  5. The mitral valve is normal in structure. Mild mitral valve regurgitation.  6. The  tricuspid valve was normal in structure.  7. Tricuspid valve regurgitation is mild-moderate.  8. Moderately elevated pulmonary artery systolic pressure.  9. Compared to 2016 and 2019 reports, LV systolic function has deteriorated. Side by side comparison was not available. FINDINGS  Left Ventricle: Left ventricular ejection fraction, by visual estimation, is 40 to 45%. The left ventricle has mildly decreased function. The left ventricle demonstrates global hypokinesis. The left ventricular internal cavity size was the left ventricle is normal in size. There is no increased left ventricular wall thickness. Indeterminate diastolic filling due to E-A fusion. Right Ventricle: The right ventricular size is normal. No increase in right ventricular wall thickness. Global RV systolic function is has normal systolic function. The tricuspid regurgitant velocity is 3.31 m/s, and with an assumed right atrial pressure  of 10 mmHg, the estimated right ventricular systolic pressure is moderately elevated at 53.8 mmHg. Left Atrium: Left atrial size was normal. Right Atrium: Right atrial size was normal. Right atrial pressure is estimated at 10 mmHg. Pericardium: There is no evidence of pericardial effusion is seen. There is no evidence of pericardial effusion. Mitral Valve: The mitral valve is normal in structure. Mild mitral valve regurgitation. Tricuspid Valve: The tricuspid valve is normal in structure. Tricuspid valve regurgitation mild-moderate. Aortic Valve: The aortic valve is normal in structure. Aortic valve regurgitation is mild. Pulmonic Valve: The pulmonic valve was not well visualized. Pulmonic valve regurgitation is not visualized by color flow Doppler. Pulmonic regurgitation is not visualized by color flow Doppler. Aorta: The aortic root, ascending aorta and aortic arch are all structurally normal, with no evidence of dilitation or obstruction. Shunts: No atrial level shunt detected by color flow Doppler.  LEFT  VENTRICLE PLAX 2D LVIDd:         5.05 cm LVIDs:         3.98 cm LV PW:         0.90 cm LV IVS:        0.90 cm LV SV:         52 ml LV SV Index:   21.50  RIGHT VENTRICLE RV S prime:  12.70 cm/s TAPSE (M-mode): 1.2 cm LEFT ATRIUM             Index       RIGHT ATRIUM           Index LA diam:        3.50 cm 1.57 cm/m  RA Area:     13.10 cm LA Vol (A2C):   26.4 ml 11.85 ml/m RA Volume:   33.30 ml  14.95 ml/m LA Vol (A4C):   38.0 ml 17.06 ml/m LA Biplane Vol: 33.5 ml 15.04 ml/m  AORTIC VALVE LVOT Vmax:   89.40 cm/s LVOT Vmean:  61.400 cm/s LVOT VTI:    0.133 m  AORTA Ao Root diam: 2.70 cm TRICUSPID VALVE TR Peak grad:   43.8 mmHg TR Vmax:        331.00 cm/s  SHUNTS Systemic VTI: 0.13 m  Sanda Klein MD Electronically signed by Sanda Klein MD Signature Date/Time: 07/19/2019/7:02:08 PM    Final    US Abdomen Limited RUQ  Result Date: 07/19/2019 CLINICAL DATA:  Right upper quadrant pain. Gallstones on recent CT. Elevated white blood cell count. EXAM: ULTRASOUND ABDOMEN LIMITED RIGHT UPPER QUADRANT COMPARISON:  Abdominal CT 06/29/2019 FINDINGS: Gallbladder: Distended with multiple intraluminal gallstones. Mild gallbladder wall thickening of 3-4 mm. No pericholecystic fluid. Positive sonographic Murphy sign noted by sonographer. Common bile duct: Diameter: 3 mm, normal. Liver: Heterogeneously increased in parenchymal echogenicity. The liver parenchyma is difficult to penetrate. No evidence of focal lesion. Portal vein is patent on color Doppler imaging with normal direction of blood flow towards the liver. Other: None. IMPRESSION: 1. Gallstones with mild gallbladder wall thickening and positive sonographic Murphy sign. Findings are consistent with acute cholecystitis in the appropriate clinical setting. 2. No biliary dilatation. 3. Hepatic steatosis. Electronically Signed   By: Keith Rake M.D.   On: 07/19/2019 00:50     Subjective: Patient seen and examined.  No overnight events.  She had some  periods of confusion yesterday and that improved.  Patient says that she has some pain on her tube site.  Able to eat.  She feels more comfortable at the nursing home and she wants to go there today.   Discharge Exam: Vitals:   07/22/19 2053 07/23/19 0432  BP: (!) 142/74 127/70  Pulse: (!) 101 77  Resp: 18 18  Temp: 98.4 F (36.9 C) 97.8 F (36.6 C)  SpO2: 94% 98%   Vitals:   07/22/19 1222 07/22/19 1306 07/22/19 2053 07/23/19 0432  BP: (!) 159/107 126/83 (!) 142/74 127/70  Pulse: (!) 111 (!) 107 (!) 101 77  Resp: 17  18 18   Temp: 98.8 F (37.1 C)  98.4 F (36.9 C) 97.8 F (36.6 C)  TempSrc: Oral  Oral Oral  SpO2: 97%  94% 98%  Weight:      Height:        General: Pt is alert, awake, not in acute distress, on room air Cardiovascular: RRR, S1/S2 +, no rubs, no gallops Respiratory: CTA bilaterally, no wheezing, no rhonchi Abdominal: Soft, ND, bowel sounds +, right upper quadrant cholecystostomy tube, draining bile freely.  Minimal tenderness. Extremities: no edema, no cyanosis Patient has normal power on right upper extremity.  Left upper extremity and bilateral lower extremity has very minimal motor function 1-2/5.   The results of significant diagnostics from this hospitalization (including imaging, microbiology, ancillary and laboratory) are listed below for reference.     Microbiology: Recent Results (from the past 240 hour(s))  Surgical  pcr screen     Status: Abnormal   Collection Time: 07/20/19 10:45 AM   Specimen: Nasal Mucosa; Nasal Swab  Result Value Ref Range Status   MRSA, PCR POSITIVE (A) NEGATIVE Final    Comment: RESULT CALLED TO, READ BACK BY AND VERIFIED WITH: NELSON,L. RN @1529  07/20/19 BILLINGSLEY,L    Staphylococcus aureus POSITIVE (A) NEGATIVE Final    Comment: (NOTE) The Xpert SA Assay (FDA approved for NASAL specimens in patients 80 years of age and older), is one component of a comprehensive surveillance program. It is not intended to diagnose  infection nor to guide or monitor treatment. Performed at [facility], [place] [address], [place], [place] [address]   Aerobic/Anaerobic Culture (surgical/deep wound)     Status: None (Preliminary result)   Collection Time: 07/21/19 12:43 PM   Specimen: Gallbladder; Abscess  Result Value Ref Range Status   Specimen Description   Final    GALL BLADDER ABSCESS Performed at [facility], [address], [place], [place] [address]    Special Requests   Final    Normal Performed at [facility] [address] [place], [place] [address]    Gram Stain   Final    ABUNDANT WBC PRESENT, PREDOMINANTLY PMN ABUNDANT GRAM POSITIVE COCCI FEW GRAM NEGATIVE RODS    Culture   Final    MODERATE FEW ESCHERICHIA COLI SUSCEPTIBILITIES TO FOLLOW Performed at [facility], [place] [address] [place], [place] [address]    Report Status PENDING  Incomplete     Labs: BNP (last 3 results) Recent Labs    05/13/19 0200 05/14/19 0512 05/15/19 0407  BNP 638.0* 697.0* 123456*   Basic Metabolic Panel: Recent Labs  Lab 07/18/19 2151 07/20/19 0818 07/21/19 0425 07/23/19 0511  NA 139 143 143 146*  K 3.7 3.8 4.1 4.0  CL 100 108 109 110  CO2 28 27 24 26   GLUCOSE 163* 128* 150* 81  BUN 14 13 11 9   CREATININE 1.16* 1.06* 0.90 0.83  CALCIUM 9.2 8.2* 8.7* 9.0  MG  --   --   --  2.2  PHOS  --   --   --  3.9   Liver Function Tests: Recent Labs  Lab 07/18/19 2151 07/21/19 0425  AST 25 16  ALT 41 26  ALKPHOS 433* 352*  BILITOT 0.9 0.5  PROT 7.7 6.7  ALBUMIN 3.5 2.8*   Recent Labs  Lab 07/18/19 2151  LIPASE 14   No results for input(s): AMMONIA in the last 168 hours. CBC: Recent Labs  Lab 07/18/19 2151 07/19/19 0830 07/21/19 0425 07/22/19 0333 07/23/19 0511  WBC 13.7* 15.1* 17.5* 15.6* 11.2*  NEUTROABS  --  11.7*  --   --  7.3  HGB 11.4* 11.1* 9.8* 10.4* 9.7*  HCT 35.3* 33.9* 31.2* 33.3* 31.3*  MCV 85.7 85.0 87.9 87.4 87.2  PLT  471* 482* 439* 493* 434*   Cardiac Enzymes: No results for input(s): CKTOTAL, CKMB, CKMBINDEX, TROPONINI in the last 168 hours. BNP: Invalid input(s): POCBNP CBG: Recent Labs  Lab 07/22/19 2048 07/23/19 0016 07/23/19 0428 07/23/19 0758 07/23/19 1035  GLUCAP 130* 130* 81 51* 116*   D-Dimer No results for input(s): DDIMER in the last 72 hours. Hgb A1c No results for input(s): HGBA1C in the last 72 hours. Lipid Profile No results for input(s): CHOL, HDL, LDLCALC, TRIG, CHOLHDL, LDLDIRECT in the last 72 hours. Thyroid function studies No results for input(s): TSH, T4TOTAL, T3FREE, THYROIDAB in  the last 72 hours.  Invalid input(s): FREET3 Anemia work up No results for input(s): VITAMINB12, FOLATE, FERRITIN, TIBC, IRON, RETICCTPCT in the last 72 hours. Urinalysis    Component Value Date/Time   COLORURINE YELLOW 07/19/2019 1030   APPEARANCEUR CLOUDY (A) 07/19/2019 1030   LABSPEC 1.016 07/19/2019 1030   PHURINE 5.0 07/19/2019 1030   GLUCOSEU NEGATIVE 07/19/2019 1030   HGBUR NEGATIVE 07/19/2019 1030   BILIRUBINUR NEGATIVE 07/19/2019 1030   KETONESUR NEGATIVE 07/19/2019 1030   PROTEINUR NEGATIVE 07/19/2019 1030   UROBILINOGEN 1.0 01/10/2015 2311   NITRITE NEGATIVE 07/19/2019 1030   LEUKOCYTESUR LARGE (A) 07/19/2019 1030   Sepsis Labs Invalid input(s): PROCALCITONIN,  WBC,  LACTICIDVEN Microbiology Recent Results (from the past 240 hour(s))  Surgical pcr screen     Status: Abnormal   Collection Time: 07/20/19 10:45 AM   Specimen: Nasal Mucosa; Nasal Swab  Result Value Ref Range Status   MRSA, PCR POSITIVE (A) NEGATIVE Final    Comment: RESULT CALLED TO, READ BACK BY AND VERIFIED WITH: NELSON,L. RN @1529  07/20/19 BILLINGSLEY,L    Staphylococcus aureus POSITIVE (A) NEGATIVE Final    Comment: (NOTE) The Xpert SA Assay (FDA approved for NASAL specimens in patients 44 years of age and older), is one component of a comprehensive surveillance program. It is not intended to  diagnose infection nor to guide or monitor treatment. Performed at [facility], [place] [address] [place], [place] [address]   Aerobic/Anaerobic Culture (surgical/deep wound)     Status: None (Preliminary result)   Collection Time: 07/21/19 12:43 PM   Specimen: Gallbladder; Abscess  Result Value Ref Range Status   Specimen Description   Final    GALL BLADDER ABSCESS Performed at [facility], [address], [place], [place] [address]    Special Requests   Final    Normal Performed at [facility] [address], [place], [place] [address]    Gram Stain   Final    ABUNDANT WBC PRESENT, PREDOMINANTLY PMN ABUNDANT GRAM POSITIVE COCCI FEW GRAM NEGATIVE RODS    Culture   Final    MODERATE FEW ESCHERICHIA COLI SUSCEPTIBILITIES TO FOLLOW Performed at [facility], [place] [address], [place], [place] [address]    Report Status PENDING  Incomplete     Time coordinating discharge:  45 minutes  SIGNED:   Barb Merino, MD  [facility] 07/23/2019, 10:43 AM

## 2019-07-23 NOTE — Progress Notes (Signed)
Yesterday evening patient was still experiencing confusion/delusions about staff "messing with her" and "carbon monoxide fumes" in her room, calling 911. (Thought to be d/t iv Dilaudid which was discontinued yesterday due to this.) Patient at that time still able to answer orientation questions correctly. Patient seems better throughout early morning today, no longer mentioning suspicions or displaying paranoia. Pt is oriented and states she is hoping to be discharged soon. Pt educated on plan of care. Will continue to monitor.

## 2019-07-23 NOTE — Progress Notes (Signed)
Blood sugar 51. Breakfast is here. Pt is asymptomatic with blood sugar.

## 2019-07-23 NOTE — Progress Notes (Signed)
Patient ID: Shannon Garrett, female   DOB: 06-Jul-1960, 59 y.o.   MRN: [id number]    3 Days Post-Op  Subjective: Confusion overnight noted. No pain this morning.   ROS: See above, otherwise other systems negative  Objective: Vital signs in last 24 hours: Temp:  [97.8 F (36.6 C)-98.8 F (37.1 C)] 97.8 F (36.6 C) (01/21 0432) Pulse Rate:  [77-111] 77 (01/21 0432) Resp:  [17-18] 18 (01/21 0432) BP: (126-159)/(70-107) 127/70 (01/21 0432) SpO2:  [94 %-98 %] 98 % (01/21 0432) Last BM Date: 07/21/19  Intake/Output from previous day: 01/20 0701 - 01/21 0700 In: 240 [P.O.:240] Out: 595 [Urine:550; Drains:45] Intake/Output this shift: No intake/output data recorded.  PE: Abd: obese, soft, vague mass remains palpable in RUQ. PCT output serosanguinous  Lab Results:  Recent Labs    07/22/19 0333 07/23/19 0511  WBC 15.6* 11.2*  HGB 10.4* 9.7*  HCT 33.3* 31.3*  PLT 493* 434*   BMET Recent Labs    07/21/19 0425 07/23/19 0511  NA 143 146*  K 4.1 4.0  CL 109 110  CO2 24 26  GLUCOSE 150* 81  BUN 11 9  CREATININE 0.90 0.83  CALCIUM 8.7* 9.0   PT/INR Recent Labs    07/21/19 0425  LABPROT 15.3*  INR 1.2   CMP     Component Value Date/Time   NA 146 (H) 07/23/2019 0511   NA 140 08/26/2018 0959   K 4.0 07/23/2019 0511   CL 110 07/23/2019 0511   CO2 26 07/23/2019 0511   GLUCOSE 81 07/23/2019 0511   BUN 9 07/23/2019 0511   BUN 10 08/26/2018 0959   CREATININE 0.83 07/23/2019 0511   CALCIUM 9.0 07/23/2019 0511   PROT 6.7 07/21/2019 0425   PROT 6.4 08/26/2018 0959   ALBUMIN 2.8 (L) 07/21/2019 0425   ALBUMIN 3.9 08/26/2018 0959   AST 16 07/21/2019 0425   ALT 26 07/21/2019 0425   ALKPHOS 352 (H) 07/21/2019 0425   BILITOT 0.5 07/21/2019 0425   BILITOT 0.2 08/26/2018 0959   GFRNONAA >60 07/23/2019 0511   GFRAA >60 07/23/2019 0511   Lipase     Component Value Date/Time   LIPASE 14 07/18/2019 2151       Studies/Results: IR Perc Cholecystostomy  Result  Date: 07/21/2019 INDICATION: Acute calculus cholecystitis EXAM: ULTRASOUND AND FLUOROSCOPIC PERCUTANEOUS CHOLECYSTOSTOMY MEDICATIONS: Patient is already receiving as inpatient ANESTHESIA/SEDATION: Moderate (conscious) sedation was employed during this procedure. A total of Versed 0.0 mg and Fentanyl 100 mcg was administered intravenously. Moderate Sedation Time: 10 minutes. The patient's level of consciousness and vital signs were monitored continuously by radiology nursing throughout the procedure under my direct supervision. FLUOROSCOPY TIME:  Fluoroscopy Time: 1 minutes 12 seconds (22 mGy). COMPLICATIONS: None immediate. PROCEDURE: Informed written consent was obtained from the patient after a thorough discussion of the procedural risks, benefits and alternatives. All questions were addressed. Maximal Sterile Barrier Technique was utilized including caps, mask, sterile gowns, sterile gloves, sterile drape, hand hygiene and skin antiseptic. A timeout was performed prior to the initiation of the procedure. Previous imaging reviewed. Preliminary ultrasound. The distended gallbladder containing echogenic gallstones was localized in the mid axillary line along the subcostal margin. Under sterile conditions and local anesthesia, percutaneous needle access performed of the distended gallbladder fundus. Needle position confirmed with ultrasound. There was return purulent bile. Sample sent culture. Guidewire advanced followed by the Accustick dilator set. Amplatz guidewire advanced followed by tract dilatation insert a 10 French drain. Drain catheter position confirmed with ultrasound  and fluoroscopy. Syringe aspiration yielded 50 cc purulent bile. This decompressed gallbladder. Catheter secured with Prolene suture and a sterile dressing. Gravity drainage bag connected. Patient tolerated the procedure well. No immediate complication. IMPRESSION: Successful ultrasound and fluoroscopic percutaneous cholecystostomy.  Electronically Signed   By: Jerilynn Mages.  Shick M.D.   On: 07/21/2019 14:44   DG Abd Portable 1V  Result Date: 07/22/2019 CLINICAL DATA:  Recent cholecystostomy EXAM: PORTABLE ABDOMEN - 1 VIEW COMPARISON:  None. FINDINGS: Scattered large and small bowel gas is noted. Mild retained fecal material is seen. No obstructive changes are noted. No free air is seen. Cholecystostomy tube is noted in the right upper quadrant. No bony abnormality is noted. IMPRESSION: No obstructive changes are seen. Electronically Signed   By: Inez Catalina M.D.   On: 07/22/2019 10:21    Anti-infectives: Anti-infectives (From admission, onward)   Start     Dose/Rate Route Frequency Ordered Stop   07/20/19 2200  fluconazole (DIFLUCAN) tablet 150 mg     150 mg Oral Daily 07/20/19 2133 07/20/19 2159   07/20/19 1245  meropenem (MERREM) 1 g in sodium chloride 0.9 % 100 mL IVPB     1 g 200 mL/hr over 30 Minutes Intravenous  Once 07/20/19 1243 07/20/19 1551   07/19/19 0415  meropenem (MERREM) 1 g in sodium chloride 0.9 % 100 mL IVPB     1 g 200 mL/hr over 30 Minutes Intravenous Every 8 hours 07/19/19 0409     07/19/19 0200  ciprofloxacin (CIPRO) IVPB 400 mg     400 mg 200 mL/hr over 60 Minutes Intravenous  Once 07/19/19 0146 07/19/19 0447       Assessment/Plan MS with paraplegia HTN bedbound Chronic systolic heart failure Cardiomyopathy with EF of 20-25%  Acute cholecystitis POD 3, S/p diagnostic laparoscopy 07/20/19 Dr. Redmond Pulling -IR perc chole drain placement on 1/19 - unable to complete cholecystectomy due to marked inflammation - IR was consulted for perc chole drain  - WBC downtrending - will tentatively plan for office follow up in 6-8 weeks to assess suitability for interval cholecystectomy.   FEN -advance as tolerated VTE -SQ heparin ID -merrem 1/17>> recommend 10 days total of abx therapy.  OK to transition to PO abx.   Dispo: per medical team. Surgical service will follow peripherally. Please call if we can  help.   LOS: 4 days    Clovis Riley , [facility] 07/23/2019, 8:02 AM Please see Amion for pager number during day hours 7:00am-4:30pm or 7:00am -11:30am on weekends

## 2019-07-26 LAB — AEROBIC/ANAEROBIC CULTURE W GRAM STAIN (SURGICAL/DEEP WOUND): Special Requests: NORMAL

## 2019-08-18 NOTE — Progress Notes (Signed)
Cardiology Office Note   Date:  08/20/2019   ID:  Paddy Panganiban, DOB 26-Sep-1960, MRN [id number]  PCP:  Gildardo Cranker, DO  Cardiologist:  Peter Martinique, MD EP: None  Chief Complaint  Patient presents with  . Hospitalization Follow-up    CHF      History of Present Illness: Shannon Garrett is a 59 y.o. female with a PMH of NICM (EF improved to 55-60% from 20-25% in 2013), HTN, DM type 2, MS (wheelchair bound), neurogenic bladder with recurrent UTI's, and depression, who presents for post-hospital follow-up.  She was initially diagnosed with acute combined CHF in 2013. Ischemic evaluation was deferred as it was felt her cardiomyopathy was 2/2 multiple chemotherapeutic agents used to manage her MS around that time. Subsequent echo's showed improvement in her EF to 55-60% in 2019, however most recent echo 07/19/19 showed EF down to 40-45%.   Since her last outpatient visit with cardiology she has had 2 admissions to the hospital. Once 05/10/2019-05/18/2019 for COVID-19 and more recently 07/18/19-07/23/19 for cholecystitis. Cardiology consulted for preoperative clearance and deemed her to be intermediate risk due to history of NICM. She underwent surgery 07/20/19, however surgery was unsuccessful due to significant inflammation in the RUQ and poor visualization of the gallbladder; instead a cholecystostomy tube was placed. Cardiology recommended discharge home on spironolactone 25mg  daily, torsemide 20mg  daily, and carvedilol 25mg  BID though it appears she was discharged home on metoprolol tartrate 75mg  BID instead.   She presents today for post-hospital follow-up. She has been doing okay since discharge from the hospital. Her cholecystostomy tube has been irritating - dealing with flushes/drainage, itching. She has not followed up with surgery yet. No complaints of chest pain, SOB, dizziness, lightheadedness, syncope, LE edema, or palpitations. She is bedbound and unable to complete 4 METs.   Past  Medical History:  Diagnosis Date  . Cardiomyopathy ([place])    a.  Echo 04/29/12: Mild LVH, EF 20-25%, mild AI, moderate MR, moderate LAE, mild RAE, mild RVE, moderate TR, PASP 51, small pericardial effusion;   b. probably non-ischemic given multiple chemo-Tx agents used for MS and global LV dysfn on echo  . Chronic systolic heart failure ([place])   . COVID-19 05/10/2019   Per facility paperwork  . Depression   . Glaucoma   . Hypertension   . MS (multiple sclerosis) ([place])    a. Dx'd late 20's. b. Tx with Novantrone, Tysabri, Copaxone previously.    Past Surgical History:  Procedure Laterality Date  . ABLATION     uterine  . CESAREAN SECTION    . CHOLECYSTECTOMY N/A 07/20/2019   Procedure: DIAGNOSTIC LAPAROSCOPY;  Surgeon: [name], MD;  Location: [facility];  Service: General;  Laterality: N/A;  . IR PERC CHOLECYSTOSTOMY  07/21/2019     Current Outpatient Medications  Medication Sig Dispense Refill  . acetaminophen (TYLENOL) 325 MG tablet Take 650 mg by mouth every 8 (eight) hours as needed for mild pain, moderate pain or headache.     . albuterol (PROVENTIL) (2.5 MG/3ML) 0.083% nebulizer solution Take 3 mLs (2.5 mg total) by nebulization every 2 (two) hours as needed for wheezing. (Patient not taking: Reported on 08/19/2019) 75 mL 12  . albuterol (VENTOLIN HFA) 108 (90 Base) MCG/ACT inhaler Inhale 2 puffs into the lungs every 6 (six) hours as needed for wheezing or shortness of breath.     Marland Kitchen amantadine (SYMMETREL) 100 MG capsule Take 100 mg by mouth daily.     . Amino Acids-Protein Hydrolys (  FEEDING SUPPLEMENT, PRO-STAT SUGAR FREE 64,) LIQD Take 30 mLs by mouth 3 (three) times daily with meals.    Marland Kitchen ascorbic acid (VITAMIN C) 500 MG tablet Take 500 mg by mouth daily.     Marland Kitchen aspirin 325 MG EC tablet Take 325 mg by mouth daily.    . Baclofen 5 MG TABS Take 5 mg by mouth 4 (four) times daily. Hold if lethargic or confused     . buPROPion (WELLBUTRIN XL) 150 MG 24 hr tablet Take 150 mg by mouth  daily after breakfast.    . CRANBERRY PO Take 100 mg by mouth at bedtime.     . DULoxetine (CYMBALTA) 30 MG capsule Take 90 mg by mouth daily.    . Ensure (ENSURE) Take 237 mLs by mouth 2 (two) times daily between meals.    . famotidine (PEPCID) 20 MG tablet Take 1 tablet (20 mg total) by mouth 2 (two) times daily.    . ferrous sulfate 325 (65 FE) MG tablet Take 325 mg by mouth 2 (two) times daily with a meal.     . fluticasone (FLONASE) 50 MCG/ACT nasal spray Place 2 sprays into both nostrils every evening.    . gabapentin (NEURONTIN) 800 MG tablet Take 800 mg by mouth 3 (three) times daily.    . insulin aspart (NOVOLOG FLEXPEN) 100 UNIT/ML FlexPen 0-15 Units, Subcutaneous, 3 times daily with meals CBG < 70: implement hypoglycemia protocol-call MD CBG 70 - 120: 0 units CBG 121 - 150: 2 units CBG 151 - 200: 3 units CBG 201 - 250: 5 units CBG 251 - 300: 8 units CBG 301 - 350: 11 units CBG 351 - 400: 15 units CBG > 400: 15 mL 0  . insulin glargine (LANTUS) 100 UNIT/ML injection Inject 0.1 mLs (10 Units total) into the skin daily. 10 mL 11  . Lactobacillus (PROBIOTIC ACIDOPHILUS) CAPS Take 1 capsule by mouth daily.     Marland Kitchen latanoprost (XALATAN) 0.005 % ophthalmic solution Place 1 drop into both eyes at bedtime.    Marland Kitchen lubiprostone (AMITIZA) 24 MCG capsule Take 24 mcg by mouth 2 (two) times daily with a meal.    . Melatonin 5 MG TABS Take 10 mg by mouth at bedtime.     . metoprolol succinate (TOPROL-XL) 100 MG 24 hr tablet Take 1 tablet (100 mg total) by mouth daily. Take with or immediately following a meal. 90 tablet 3  . metoprolol succinate (TOPROL-XL) 50 MG 24 hr tablet Take 1 tablet (50 mg total) by mouth daily. Take with or immediately following a meal. 90 tablet 3  . Multiple Vitamins-Minerals (MULTIVITAMIN ADULT PO) Take 1 tablet by mouth daily.    Marland Kitchen omeprazole (PRILOSEC) 20 MG capsule Take 20 mg by mouth daily.    . ondansetron (ZOFRAN) 4 MG tablet Take 4 mg by mouth every 6 (six) hours  as needed for nausea or vomiting.    . potassium chloride (KLOR-CON) 20 MEQ packet Take 20 mEq by mouth daily.     Marland Kitchen senna-docusate (SENOKOT S) 8.6-50 MG tablet Take 2 tablets by mouth 2 (two) times daily.     . sodium phosphate Pediatric (FLEET) 3.5-9.5 GM/59ML enema Place 1 enema rectally once as needed for mild constipation, moderate constipation or severe constipation.    Marland Kitchen spironolactone (ALDACTONE) 25 MG tablet Take 25 mg by mouth daily. For CHF    . tamsulosin (FLOMAX) 0.4 MG CAPS capsule Take 0.4 mg by mouth daily. For incontinence    .  torsemide (DEMADEX) 20 MG tablet Take 20 mg by mouth daily.      No current facility-administered medications for this visit.    Allergies:   Sulfonamide derivatives and Penicillins    Social History:  The patient  reports that she has quit smoking. She has a 2.50 pack-year smoking history. She has never used smokeless tobacco. She reports that she does not drink alcohol or use drugs.   Family History:  The patient's family history includes Cancer in her father and mother; Hypertension in her mother; Multiple sclerosis in her sister.    ROS:  Please see the history of present illness.   Otherwise, review of systems are positive for none.   All other systems are reviewed and negative.    PHYSICAL EXAM: VS:  BP 120/78   Pulse 80   Ht 5\' 2"  (1.575 m)   Wt 211 lb (95.7 kg)   SpO2 97%   BMI 38.59 kg/m  , BMI Body mass index is 38.59 kg/m. GEN: Bedbound, on stretcher after being brought in by EMS.  HEENT: sclera anicteric Neck: no JVD, carotid bruits, or masses Cardiac: RRR; no murmurs, rubs, or gallops, no edema  Respiratory:  clear to auscultation bilaterally, normal work of breathing GI: soft, nontender, nondistended, + BS MS: no deformity or atrophy Skin: warm and dry, no rash Neuro:  Strength and sensation are intact Psych: euthymic mood, full affect   EKG:  EKG is not ordered today.   Recent Labs: 05/10/2019: TSH  2.526 05/15/2019: B Natriuretic Peptide 682.7 07/21/2019: ALT 26 07/23/2019: BUN 9; Creatinine, Ser 0.83; Hemoglobin 9.7; Magnesium 2.2; Platelets 434; Potassium 4.0; Sodium 146    Lipid Panel    Component Value Date/Time   CHOL 183 12/10/2017 0000   TRIG 273 (H) 05/10/2019 1452   HDL 31 (A) 12/10/2017 0000   CHOLHDL 6.3 05/01/2012 0500   VLDL 14 05/01/2012 0500   LDLCALC 98 12/10/2017 0000      Wt Readings from Last 3 Encounters:  08/19/19 211 lb (95.7 kg)  07/21/19 219 lb 5.7 oz (99.5 kg)  05/10/19 264 lb 8.8 oz (120 kg)      Other studies Reviewed: Additional studies/ records that were reviewed today include:   Echocardiogram 07/19/19: 1. Left ventricular ejection fraction, by visual estimation, is 40 to  45%. The left ventricle has mildly decreased function. There is no  increased left ventricular wall thickness.  2. Indeterminate diastolic filling due to E-A fusion.  3. The left ventricle demonstrates global hypokinesis.  4. The right ventricle has normal systolic function.The right ventricular  size is normal.  5. The mitral valve is normal in structure. Mild mitral valve  regurgitation.  6. The tricuspid valve was normal in structure.  7. Tricuspid valve regurgitation is mild-moderate.  8. Moderately elevated pulmonary artery systolic pressure.  9. Compared to 2016 and 2019 reports, LV systolic function has  deteriorated. Side by side comparison was not available.     ASSESSMENT AND PLAN:  1. Chronic combined CHF/NICM: EF down a bit to 40-45% on last echo 07/2019, though side by side comparison of 2019 echo was not available. In the past this was attributed to prior chemotherapy. She did have COVID 05/2019 which may have contributed to recent decline. No prior ischemic evaluation. No anginal complaints, though she is bedbound. - Will check a NST to evaluate for ischemia - Will consolidate metoprolol to succinate - Continue spironolactone and  torsemide  2. HTN: BP 120/78 today -  Managed in the context of #1  3. DM type 2: A1C 7.6 05/2019; goal <7 - Continue management per PCP  4. Cholecystitis s/p cholecystostomy tube: failed lap chole due to significant inflammation. S/p perc chole drain - Continue to follow-up with surgery - she is unsure of any upcoming appointments. Encouraged her to call the surgeons office to clarify.    Current medicines are reviewed at length with the patient today.  The patient does not have concerns regarding medicines.  The following changes have been made:  As above  Labs/ tests ordered today include:   Orders Placed This Encounter  Procedures  . Myocardial Perfusion Imaging     Disposition:   FU with Dr. Ofilia Neas in 6 months  Signed, Abigail Butts, PA-C  08/20/2019 9:31 AM

## 2019-08-19 ENCOUNTER — Encounter: Payer: Self-pay | Admitting: Medical

## 2019-08-19 ENCOUNTER — Ambulatory Visit (INDEPENDENT_AMBULATORY_CARE_PROVIDER_SITE_OTHER): Payer: Medicare (Managed Care) | Admitting: Medical

## 2019-08-19 VITALS — BP 120/78 | HR 80 | Ht 62.0 in | Wt 211.0 lb

## 2019-08-19 DIAGNOSIS — E119 Type 2 diabetes mellitus without complications: Secondary | ICD-10-CM

## 2019-08-19 DIAGNOSIS — I5042 Chronic combined systolic (congestive) and diastolic (congestive) heart failure: Secondary | ICD-10-CM | POA: Diagnosis not present

## 2019-08-19 DIAGNOSIS — I10 Essential (primary) hypertension: Secondary | ICD-10-CM

## 2019-08-19 DIAGNOSIS — Z794 Long term (current) use of insulin: Secondary | ICD-10-CM | POA: Diagnosis not present

## 2019-08-19 MED ORDER — METOPROLOL SUCCINATE ER 100 MG PO TB24: 100.0000 mg | ORAL_TABLET | Freq: Every day | ORAL | 3 refills | Status: DC

## 2019-08-19 MED ORDER — METOPROLOL SUCCINATE ER 50 MG PO TB24: 50.0000 mg | ORAL_TABLET | Freq: Every day | ORAL | 3 refills | Status: DC

## 2019-08-19 NOTE — Patient Instructions (Signed)
Medication Instructions:  STOP TAKING METOPROLOL TARTRATE BEGIN TAKING METOPROLOL SUCCINATE 150MG  DAILY (1 TABLET THAT IS 100MG  AND 1 TABLET THAT IS 50MG ) *If you need a refill on your cardiac medications before your next appointment, please call your pharmacy*  Testing/Procedures: Your physician has requested that you have a lexiscan myoview. For further information please visit [email or web address]. Please follow instruction sheet, as given.  [place].  Follow-Up: At [facility], you and your health needs are our priority.  As part of our continuing mission to provide you with exceptional heart care, we have created designated Provider Care Teams.  These Care Teams include your primary Cardiologist (physician) and Advanced Practice Providers (APPs -  Physician Assistants and Nurse Practitioners) who all work together to provide you with the care you need, when you need it.  Your next appointment:   6 month(s)  The format for your next appointment:   In Person  Provider:   You may see Peter Martinique, MD or one of the following Advanced Practice Providers on your designated Care Team:    Almyra Deforest, PA-C  Fabian Sharp, PA-C or   Roby Lofts, [place]

## 2019-08-20 ENCOUNTER — Encounter: Payer: Self-pay | Admitting: Medical

## 2019-09-02 ENCOUNTER — Telehealth (HOSPITAL_COMMUNITY): Payer: Self-pay

## 2019-09-02 NOTE — Telephone Encounter (Signed)
Encounter complete. 

## 2019-09-03 ENCOUNTER — Inpatient Hospital Stay (HOSPITAL_COMMUNITY): Admit: 2019-09-03 | Payer: Medicare (Managed Care)

## 2019-09-04 ENCOUNTER — Telehealth: Payer: Self-pay | Admitting: *Deleted

## 2019-09-04 ENCOUNTER — Other Ambulatory Visit: Payer: Self-pay

## 2019-09-04 ENCOUNTER — Encounter (HOSPITAL_COMMUNITY): Payer: Self-pay

## 2019-09-04 ENCOUNTER — Ambulatory Visit (HOSPITAL_COMMUNITY)
Admission: RE | Admit: 2019-09-04 | Discharge: 2019-09-04 | Disposition: A | Discharge status: Home / Self Care | Payer: Medicare (Managed Care) | Source: Ambulatory Visit | Attending: Cardiovascular Disease | Admitting: Cardiovascular Disease

## 2019-09-04 DIAGNOSIS — I10 Essential (primary) hypertension: Secondary | ICD-10-CM

## 2019-09-04 DIAGNOSIS — I5032 Chronic diastolic (congestive) heart failure: Secondary | ICD-10-CM

## 2019-09-04 DIAGNOSIS — E119 Type 2 diabetes mellitus without complications: Secondary | ICD-10-CM

## 2019-09-04 DIAGNOSIS — I5042 Chronic combined systolic (congestive) and diastolic (congestive) heart failure: Secondary | ICD-10-CM

## 2019-09-04 NOTE — Telephone Encounter (Signed)
Appointment information and arrival time at [facility] for outpatient myoview given to EMS transport ---Appointment is 09/17/19 at 11:00 am---arrival time is 10:30 am---radiology department at [facility]

## 2019-09-04 NOTE — Telephone Encounter (Addendum)
Patient presents to office today for lexiscan, unable to get on table as patient on stretcher Order placed to have done at hospital, Staci Righter getting scheduled

## 2019-09-07 ENCOUNTER — Ambulatory Visit (HOSPITAL_COMMUNITY)
Admission: RE | Admit: 2019-09-07 | Discharge: 2019-09-07 | Disposition: A | Discharge status: Home / Self Care | Payer: Medicare (Managed Care) | Source: Ambulatory Visit | Attending: Student | Admitting: Student

## 2019-09-07 ENCOUNTER — Encounter (HOSPITAL_COMMUNITY): Payer: Self-pay

## 2019-09-07 ENCOUNTER — Other Ambulatory Visit: Payer: Self-pay

## 2019-09-07 ENCOUNTER — Other Ambulatory Visit (HOSPITAL_COMMUNITY): Payer: Self-pay | Admitting: Student

## 2019-09-07 DIAGNOSIS — B9620 Unspecified Escherichia coli [E. coli] as the cause of diseases classified elsewhere: Secondary | ICD-10-CM | POA: Insufficient documentation

## 2019-09-07 DIAGNOSIS — K819 Cholecystitis, unspecified: Secondary | ICD-10-CM

## 2019-09-07 DIAGNOSIS — Z4803 Encounter for change or removal of drains: Secondary | ICD-10-CM | POA: Diagnosis present

## 2019-09-07 DIAGNOSIS — Z1611 Resistance to penicillins: Secondary | ICD-10-CM | POA: Diagnosis not present

## 2019-09-07 DIAGNOSIS — Z434 Encounter for attention to other artificial openings of digestive tract: Secondary | ICD-10-CM | POA: Insufficient documentation

## 2019-09-07 HISTORY — PX: IR EXCHANGE BILIARY DRAIN: IMG6046

## 2019-09-07 MED ORDER — LIDOCAINE HCL 1 % IJ SOLN
INTRAMUSCULAR | Status: AC
  Filled 2019-09-07: qty 20

## 2019-09-07 MED ORDER — IOHEXOL 300 MG/ML  SOLN
50.0000 mL | Freq: Once | INTRAMUSCULAR | Status: AC | PRN
  Administered 2019-09-07: 20 mL

## 2019-09-07 NOTE — Procedures (Signed)
Interventional Radiology Procedure Note  Procedure: CHOLECYSTOSTOMY REPLACMENT  Complications: None  Estimated Blood Loss: NONE  Findings: RETRACTED OCCLUDED TUBE, successfully exchged and repositioned

## 2019-09-12 LAB — AEROBIC/ANAEROBIC CULTURE W GRAM STAIN (SURGICAL/DEEP WOUND): Special Requests: NORMAL

## 2019-09-16 ENCOUNTER — Ambulatory Visit: Payer: Self-pay | Admitting: Surgery

## 2019-09-16 ENCOUNTER — Telehealth: Payer: Self-pay | Admitting: *Deleted

## 2019-09-16 NOTE — Telephone Encounter (Signed)
   [facility] Pre-operative Risk Assessment    Request for surgical clearance:  1. What type of surgery is being performed? Laparoscopic cholecystectomy   2. When is this surgery scheduled? TBD   3. What type of clearance is required (medical clearance vs. Pharmacy clearance to hold med vs. Both)? both  4. Are there any medications that need to be held prior to surgery and how long? ASA   5. Practice name and name of physician performing surgery? [facility] ?   6. What is your office phone number [phone]   7.   What is your office fax number [phone] attn: Andreas Blower CMA  8.   Anesthesia type (None, local, MAC, general) ? general   Shannon Garrett A Olar Santini 09/16/2019, 2:11 PM  _________________________________________________________________

## 2019-09-16 NOTE — H&P (Signed)
Surgical H&P  Chief Complaint: cholecystitis  HPI: 59 year old woman returns for planning of cholecystectomy. She was admitted to the hospital in January of this year with cholecystitis. The inflammation was so significant that surgery was aborted and she underwent percutaneous cholecystostomy tube. She is no longer having any pain from her gallbladder. She did have the tube replaced about a week ago and purulent aspirate was noted. She states that the tube is aggravating. She does suffer with chronic constipation due to pain meds that she takes for her multiple sclerosis but otherwise denies GI issues, denies fevers. She did recently see her cardiologist, and noted her ejection fraction has improved to 40-45% on most recent echo. She is scheduled to have a nuclear stress test in the near future.  Allergies  Allergen Reactions  . Sulfonamide Derivatives Hives and Shortness Of Breath  . Penicillins Hives and Itching    Has patient had a PCN reaction causing immediate rash, facial/tongue/throat swelling, SOB or lightheadedness with hypotension: unknown Has patient had a PCN reaction causing severe rash involving mucus membranes or skin necrosis: unknown Has patient had a PCN reaction that required hospitalization: unknown Has patient had a PCN reaction occurring within the last 10 years: unknown If all of the above answers are "NO", then may proceed with Cephalosporin use. Prior course of rocephin charted 05/2015    Past Medical History:  Diagnosis Date  . Cardiomyopathy ([place])    a.  Echo 04/29/12: Mild LVH, EF 20-25%, mild AI, moderate MR, moderate LAE, mild RAE, mild RVE, moderate TR, PASP 51, small pericardial effusion;   b. probably non-ischemic given multiple chemo-Tx agents used for MS and global LV dysfn on echo  . Chronic systolic heart failure ([place])   . COVID-19 05/10/2019   Per facility paperwork  . Depression   . Glaucoma   . Hypertension   . MS (multiple sclerosis) ([place])     a. Dx'd late 20's. b. Tx with Novantrone, Tysabri, Copaxone previously.    Past Surgical History:  Procedure Laterality Date  . ABLATION     uterine  . CESAREAN SECTION    . CHOLECYSTECTOMY N/A 07/20/2019   Procedure: DIAGNOSTIC LAPAROSCOPY;  Surgeon: [name], MD;  Location: [facility];  Service: General;  Laterality: N/A;  . IR EXCHANGE BILIARY DRAIN  09/07/2019  . IR PERC CHOLECYSTOSTOMY  07/21/2019    Family History  Problem Relation Age of Onset  . Hypertension Mother   . Cancer Mother        breast   . Cancer Father        prostate  . Multiple sclerosis Sister   . Heart attack Neg Hx     Social History   Socioeconomic History  . Marital status: Single    Spouse name: Not on file  . Number of children: Not on file  . Years of education: Not on file  . Highest education level: Not on file  Occupational History  . Not on file  Tobacco Use  . Smoking status: Former Smoker    Packs/day: 0.50    Years: 5.00    Pack years: 2.50  . Smokeless tobacco: Never Used  Substance and Sexual Activity  . Alcohol use: No  . Drug use: No  . Sexual activity: Not Currently  Other Topics Concern  . Not on file  Social History Narrative  . Not on file   Social Determinants of Health   Financial Resource Strain:   . Difficulty of Paying Living Expenses:  Food Insecurity:   . Worried About Charity fundraiser in the Last Year:   . Arboriculturist in the Last Year:   Transportation Needs:   . Film/video editor (Medical):   Marland Kitchen Lack of Transportation (Non-Medical):   Physical Activity:   . Days of Exercise per Week:   . Minutes of Exercise per Session:   Stress:   . Feeling of Stress :   Social Connections:   . Frequency of Communication with Friends and Family:   . Frequency of Social Gatherings with Friends and Family:   . Attends Religious Services:   . Active Member of Clubs or Organizations:   . Attends Archivist Meetings:   Marland Kitchen Marital Status:      Current Outpatient Medications on File Prior to Visit  Medication Sig Dispense Refill  . acetaminophen (TYLENOL) 325 MG tablet Take 650 mg by mouth every 8 (eight) hours as needed for mild pain, moderate pain or headache.     . albuterol (PROVENTIL) (2.5 MG/3ML) 0.083% nebulizer solution Take 3 mLs (2.5 mg total) by nebulization every 2 (two) hours as needed for wheezing. (Patient not taking: Reported on 08/19/2019) 75 mL 12  . albuterol (VENTOLIN HFA) 108 (90 Base) MCG/ACT inhaler Inhale 2 puffs into the lungs every 6 (six) hours as needed for wheezing or shortness of breath.     Marland Kitchen amantadine (SYMMETREL) 100 MG capsule Take 100 mg by mouth daily.     . Amino Acids-Protein Hydrolys (FEEDING SUPPLEMENT, PRO-STAT SUGAR FREE 64,) LIQD Take 30 mLs by mouth 3 (three) times daily with meals.    Marland Kitchen ascorbic acid (VITAMIN C) 500 MG tablet Take 500 mg by mouth daily.     Marland Kitchen aspirin 325 MG EC tablet Take 325 mg by mouth daily.    . Baclofen 5 MG TABS Take 5 mg by mouth 4 (four) times daily. Hold if lethargic or confused     . buPROPion (WELLBUTRIN XL) 150 MG 24 hr tablet Take 150 mg by mouth daily after breakfast.    . CRANBERRY PO Take 100 mg by mouth at bedtime.     . DULoxetine (CYMBALTA) 30 MG capsule Take 90 mg by mouth daily.    . Ensure (ENSURE) Take 237 mLs by mouth 2 (two) times daily between meals.    . famotidine (PEPCID) 20 MG tablet Take 1 tablet (20 mg total) by mouth 2 (two) times daily.    . ferrous sulfate 325 (65 FE) MG tablet Take 325 mg by mouth 2 (two) times daily with a meal.     . fluticasone (FLONASE) 50 MCG/ACT nasal spray Place 2 sprays into both nostrils every evening.    . gabapentin (NEURONTIN) 800 MG tablet Take 800 mg by mouth 3 (three) times daily.    . insulin aspart (NOVOLOG FLEXPEN) 100 UNIT/ML FlexPen 0-15 Units, Subcutaneous, 3 times daily with meals CBG < 70: implement hypoglycemia protocol-call MD CBG 70 - 120: 0 units CBG 121 - 150: 2 units CBG 151 - 200: 3  units CBG 201 - 250: 5 units CBG 251 - 300: 8 units CBG 301 - 350: 11 units CBG 351 - 400: 15 units CBG > 400: 15 mL 0  . insulin glargine (LANTUS) 100 UNIT/ML injection Inject 0.1 mLs (10 Units total) into the skin daily. 10 mL 11  . Lactobacillus (PROBIOTIC ACIDOPHILUS) CAPS Take 1 capsule by mouth daily.     Marland Kitchen latanoprost (XALATAN) 0.005 % ophthalmic solution Place  1 drop into both eyes at bedtime.    Marland Kitchen lubiprostone (AMITIZA) 24 MCG capsule Take 24 mcg by mouth 2 (two) times daily with a meal.    . Melatonin 5 MG TABS Take 10 mg by mouth at bedtime.     . metoprolol succinate (TOPROL-XL) 100 MG 24 hr tablet Take 1 tablet (100 mg total) by mouth daily. Take with or immediately following a meal. 90 tablet 3  . metoprolol succinate (TOPROL-XL) 50 MG 24 hr tablet Take 1 tablet (50 mg total) by mouth daily. Take with or immediately following a meal. 90 tablet 3  . Multiple Vitamins-Minerals (MULTIVITAMIN ADULT PO) Take 1 tablet by mouth daily.    Marland Kitchen omeprazole (PRILOSEC) 20 MG capsule Take 20 mg by mouth daily.    . ondansetron (ZOFRAN) 4 MG tablet Take 4 mg by mouth every 6 (six) hours as needed for nausea or vomiting.    . potassium chloride (KLOR-CON) 20 MEQ packet Take 20 mEq by mouth daily.     Marland Kitchen senna-docusate (SENOKOT S) 8.6-50 MG tablet Take 2 tablets by mouth 2 (two) times daily.     . sodium phosphate Pediatric (FLEET) 3.5-9.5 GM/59ML enema Place 1 enema rectally once as needed for mild constipation, moderate constipation or severe constipation.    Marland Kitchen spironolactone (ALDACTONE) 25 MG tablet Take 25 mg by mouth daily. For CHF    . tamsulosin (FLOMAX) 0.4 MG CAPS capsule Take 0.4 mg by mouth daily. For incontinence    . torsemide (DEMADEX) 20 MG tablet Take 20 mg by mouth daily.      No current facility-administered medications on file prior to visit.    Review of Systems: a complete, 10pt review of systems was completed with pertinent positives and negatives as documented in the  HPI  Physical Exam: Vitals  Weight: 270 lb Height: 65in Body Surface Area: 2.25 m Body Mass Index: 44.93 kg/m  Temp.: 97.60F  Pulse: 85 (Regular)  P.OX: 97% (Room air) BP: 108/72 (Sitting, Left Arm, Standard)  Alert and cooperative Unlabored respirations Obese female, on a stretcher. Abdomen is soft and nontender. Drain in right flank with mostly serous output, some purulent tinge.  CBC Latest Ref Rng & Units 07/23/2019 07/22/2019 07/21/2019  WBC 4.0 - 10.5 K/uL 11.2(H) 15.6(H) 17.5(H)  Hemoglobin 12.0 - 15.0 g/dL 9.7(L) 10.4(L) 9.8(L)  Hematocrit 36.0 - 46.0 % 31.3(L) 33.3(L) 31.2(L)  Platelets 150 - 400 K/uL 434(H) 493(H) 439(H)    CMP Latest Ref Rng & Units 07/23/2019 07/21/2019 07/20/2019  Glucose 70 - 99 mg/dL 81 150(H) 128(H)  BUN 6 - 20 mg/dL 9 11 13   Creatinine 0.44 - 1.00 mg/dL 0.83 0.90 1.06(H)  Sodium 135 - 145 mmol/L 146(H) 143 143  Potassium 3.5 - 5.1 mmol/L 4.0 4.1 3.8  Chloride 98 - 111 mmol/L 110 109 108  CO2 22 - 32 mmol/L 26 24 27   Calcium 8.9 - 10.3 mg/dL 9.0 8.7(L) 8.2(L)  Total Protein 6.5 - 8.1 g/dL - 6.7 -  Total Bilirubin 0.3 - 1.2 mg/dL - 0.5 -  Alkaline Phos 38 - 126 U/L - 352(H) -  AST 15 - 41 U/L - 16 -  ALT 0 - 44 U/L - 26 -    Lab Results  Component Value Date   INR 1.2 07/21/2019   INR 1.0 05/12/2019   INR 1.1 05/10/2019    Imaging: No results found.   A/P: CHOLECYSTITIS (K81.9) Story: Status post percutaneous cholecystostomy tube in January. Tube replaced last week, with  purulent aspirate noted. I recommend proceeding with laparoscopic cholecystectomy with possible cholangiogram. Discussed risks of surgery including bleeding, pain, scarring, intraabdominal injury specifically to the common bile duct and sequelae, conversion to open surgery, failure to resolve symptoms, blood clots/ pulmonary embolus, heart attack, pneumonia, stroke, death. It is possible that the scarring and inflammation in the right upper quadrant will preclude  surgery again, or require a fenestrated cholecystectomy and surgical drain placement. We discussed that and that it entails increased risk of bile leak which would require ERCP and stent to treat. Questions welcomed and answered to patient's satisfaction. She will need cardiac clearance prior to surgery     Patient Active Problem List   Diagnosis Date Noted  . Acute calculous cholecystitis 07/19/2019  . Insulin dependent diabetes mellitus type IA ([place]) 07/19/2019  . History of Clostridioides difficile colitis 07/19/2019  . Multiple drug resistant organism (MDRO) Klebsiella pneumonia UTI culture positive 07/19/2019  . Enteritis due to Clostridium difficile 06/24/2019  . COVID-19 virus infection 06/24/2019  . Palliative care by specialist   . DNR (do not resuscitate)   . Sepsis ([place]) 05/10/2019  . History of COVID-19 Nov2020 05/10/2019  . Dysesthesia 08/26/2018  . Dyslipidemia 06/13/2017  . Elevated alkaline phosphatase level 06/13/2017  . Acute frontal sinusitis 05/24/2017  . Influenza 04/19/2017  . Glaucoma 04/06/2017  . Allergic rhinitis due to allergen 03/07/2017  . Vaginal candidiasis 03/01/2017  . UTI (urinary tract infection) 01/02/2017  . High risk medication use 01/25/2016  . Neuropathy 11/13/2015  . GERD (gastroesophageal reflux disease) 11/13/2015  . Dysphagia as late effect of stroke 11/08/2015  . Adrenal insufficiency (Addison's disease) ([place]) 08/12/2015  . Vitamin D deficiency 06/18/2015  . Hypokalemia 06/03/2015  . Chronic anemia 05/30/2015  . Chronic pain syndrome 03/03/2015  . Essential hypertension   . Chronic constipation 11/26/2014  . Depression 11/26/2014  . Adult failure to thrive 07/31/2013  . Insomnia 06/03/2013  . Neurogenic bladder 06/03/2013  . Paraplegia ([place]) 09/05/2012  . Chronic diastolic CHF (congestive heart failure) ([place]) 05/22/2012  . Obesity 11/30/2008  . MS (multiple sclerosis) ([place]) 11/30/2008  . Hypertensive heart disease with CHF  (congestive heart failure) ([place]) 11/30/2008       Romana Juniper, MD [facility]  See AMION to contact appropriate on-call provider

## 2019-09-16 NOTE — Telephone Encounter (Signed)
   Primary Cardiologist: Peter Martinique, MD  Chart reviewed as part of pre-operative protocol coverage. Hx of NICM. Reviewed most recent note. Pending stress test 3/18.   [place], [place] 09/16/2019, 2:25 PM

## 2019-09-17 ENCOUNTER — Encounter (HOSPITAL_COMMUNITY)
Admission: RE | Admit: 2019-09-17 | Discharge: 2019-09-17 | Disposition: A | Discharge status: Home / Self Care | Payer: Medicare (Managed Care) | Source: Ambulatory Visit | Attending: Cardiovascular Disease | Admitting: Cardiovascular Disease

## 2019-09-17 ENCOUNTER — Other Ambulatory Visit: Payer: Self-pay

## 2019-09-17 DIAGNOSIS — E119 Type 2 diabetes mellitus without complications: Secondary | ICD-10-CM | POA: Insufficient documentation

## 2019-09-17 DIAGNOSIS — I5032 Chronic diastolic (congestive) heart failure: Secondary | ICD-10-CM | POA: Diagnosis not present

## 2019-09-17 DIAGNOSIS — I10 Essential (primary) hypertension: Secondary | ICD-10-CM | POA: Insufficient documentation

## 2019-09-17 DIAGNOSIS — I5042 Chronic combined systolic (congestive) and diastolic (congestive) heart failure: Secondary | ICD-10-CM | POA: Insufficient documentation

## 2019-09-17 DIAGNOSIS — Z794 Long term (current) use of insulin: Secondary | ICD-10-CM | POA: Insufficient documentation

## 2019-09-17 LAB — NM MYOCAR MULTI W/SPECT W/WALL MOTION / EF
Estimated workload: 1 METS
MPHR: 164 {beats}/min
Peak HR: 118 {beats}/min
Percent HR: 71 %
Rest HR: 91 {beats}/min

## 2019-09-17 MED ORDER — REGADENOSON 0.4 MG/5ML IV SOLN
0.4000 mg | Freq: Once | INTRAVENOUS | Status: AC
  Administered 2019-09-17: 0.4 mg via INTRAVENOUS

## 2019-09-17 MED ORDER — TECHNETIUM TC 99M TETROFOSMIN IV KIT
32.0000 | PACK | Freq: Once | INTRAVENOUS | Status: AC | PRN
  Administered 2019-09-17: 32 via INTRAVENOUS

## 2019-09-17 MED ORDER — TECHNETIUM TC 99M TETROFOSMIN IV KIT
11.0000 | PACK | Freq: Once | INTRAVENOUS | Status: AC | PRN
  Administered 2019-09-17: 11 via INTRAVENOUS

## 2019-09-17 MED ORDER — REGADENOSON 0.4 MG/5ML IV SOLN
INTRAVENOUS | Status: AC
  Filled 2019-09-17: qty 5

## 2019-09-21 NOTE — Telephone Encounter (Signed)
Hi Dr. Martinique,   Ms. [place] was admitted in 07/2019 for cholecystitis. We were consulted during the admission and she was deemed intermediate risk dur to her history of NICM. She underwent surgery on 07/20/2019. Unfortunately it was unsuccessful due to significant inflammation in the RUQ and poor visualization of the gallbladder. Cholecystotomy tube was placed instead with plans for repeat surgery as outpatient. She was seen for follow-up by [place] on 08/19/2019 at which time she was doing well from a cardiac standpoint although she is bedbound. Nuclear stress test was ordered for further evaluation of ischemia given slightly reduced EF of 40-45% on Echo during admission and in anticipation for repeat. Lexiscan was performed on 09/17/2019 and showed moderate reversible defect involving the mid lateral wall and a fixed defect involving the apex and septum. Lateral, inferior, and apical hypokinesis was also noted with a dyskinetic septum. Considered intermediate risk overall. Do you recommend any additional work-up at this time (cath?) or is she OK to proceed with cholecystectomy? Also can you comment on how long she can hold Aspirin for?   Please route response back to P CV DIV PREOP.  Thank you!

## 2019-09-22 NOTE — Telephone Encounter (Signed)
   Primary Cardiologist:Peter Martinique, MD  Chart reviewed as part of pre-operative protocol coverage. Patient recently had a stress test on 09/17/2019 which showed evidence of possible ischemia. I discussed this with Dr. Martinique who feels that since this is not an emergent surgery, patient she should undergo cardiac catheterization before any additional surgery.   Pre-op covering staff: - Please call patient and notify patient of stress test results (I am not sure if she has been notified of them yet) and notify her of need for an appointment to talk about cardiac catheterization for further evaluation.  - Please contact requesting surgeon's office via preferred method (i.e, phone, fax) to inform them of need for appointment and cardiac cath prior to surgery.  Darreld Mclean, PA-C  09/22/2019, 7:37 AM

## 2019-09-22 NOTE — Telephone Encounter (Signed)
I tried to reach pt to schedule pre op appt. I did call the primary number first listed as her work number though no answer. I then called [phone] and woman answered the phone and put me into vm for the pt. I left message to please call the office to schedule appt with Dr. Martinique or APP for pre op clearance.

## 2019-09-22 NOTE — Telephone Encounter (Signed)
I think since surgery is not emergent she should undergo cardiac cath given results of Myoview study prior to surgery. Thanks  Jazen Spraggins Martinique MD, [facility]

## 2019-09-25 NOTE — Telephone Encounter (Signed)
Pt is scheduled to see Jory Sims, DNP 10/07/19 for pre op clearance. I will send notes to DNP for upcoming appt. I will remove from the pre op call back pool.

## 2019-09-25 NOTE — Telephone Encounter (Signed)
I called [phone] listed on pt's chart as work number, this number is no longer working. I then called [phone] and was told this is where the pt resides. I asked for the pt to please our office [phone] as we are needing to schedule an appt for pre op clearance.

## 2019-10-05 NOTE — Progress Notes (Deleted)
Cardiology Office Note   Date:  10/05/2019   ID:  Shannon Garrett, DOB [birth year], MRN [id number]  PCP:  Gildardo Cranker, DO  Cardiologist:   No chief complaint on file.    History of Present Illness: Shannon Garrett is a 59 y.o. female who presents for ongoing assessment and management of NICM (EF improved to 55-60% from 20-25% in 2013), HTN, DM type 2, MS (wheelchair bound), neurogenic bladder with recurrent UTI's, and depression.  She was initially diagnosed with acute combined CHF in 2013. Ischemic evaluation was deferred as it was felt her cardiomyopathy was 2/2 multiple chemotherapeutic agents used to manage her MS around that time. Subsequent echo's showed improvement in her EF to 55-60% in 2019, however most recent echo 07/19/19 showed EF down to 40-45%.   She was scheduled for a NM Stress test on last office visit for pre-operative evaluation for cholecystectomy.Stress test was abnormal . Lexiscan was performed on 09/17/2019 and showed moderate reversible defect involving the mid lateral wall and a fixed defect involving the apex and septum. Lateral, inferior, and apical hypokinesis was also noted with a dyskinetic septum. Considered intermediate risk overall.   After consultation with Dr. Martinique, she was recommended for cardiac cath. She is here to discuss this.   Past Medical History:  Diagnosis Date  . Cardiomyopathy ([place])    a.  Echo 04/29/12: Mild LVH, EF 20-25%, mild AI, moderate MR, moderate LAE, mild RAE, mild RVE, moderate TR, PASP 51, small pericardial effusion;   b. probably non-ischemic given multiple chemo-Tx agents used for MS and global LV dysfn on echo  . Chronic systolic heart failure ([place])   . COVID-19 05/10/2019   Per facility paperwork  . Depression   . Glaucoma   . Hypertension   . MS (multiple sclerosis) ([place])    a. Dx'd late 20's. b. Tx with Novantrone, Tysabri, Copaxone previously.    Past Surgical History:  Procedure Laterality Date  . ABLATION     uterine   . CESAREAN SECTION    . CHOLECYSTECTOMY N/A 07/20/2019   Procedure: DIAGNOSTIC LAPAROSCOPY;  Surgeon: [name], MD;  Location: [facility];  Service: General;  Laterality: N/A;  . IR EXCHANGE BILIARY DRAIN  09/07/2019  . IR PERC CHOLECYSTOSTOMY  07/21/2019     Current Outpatient Medications  Medication Sig Dispense Refill  . acetaminophen (TYLENOL) 325 MG tablet Take 650 mg by mouth every 8 (eight) hours as needed for mild pain, moderate pain or headache.     . albuterol (PROVENTIL) (2.5 MG/3ML) 0.083% nebulizer solution Take 3 mLs (2.5 mg total) by nebulization every 2 (two) hours as needed for wheezing. (Patient not taking: Reported on 08/19/2019) 75 mL 12  . albuterol (VENTOLIN HFA) 108 (90 Base) MCG/ACT inhaler Inhale 2 puffs into the lungs every 6 (six) hours as needed for wheezing or shortness of breath.     Marland Kitchen amantadine (SYMMETREL) 100 MG capsule Take 100 mg by mouth daily.     . Amino Acids-Protein Hydrolys (FEEDING SUPPLEMENT, PRO-STAT SUGAR FREE 64,) LIQD Take 30 mLs by mouth 3 (three) times daily with meals.    Marland Kitchen ascorbic acid (VITAMIN C) 500 MG tablet Take 500 mg by mouth daily.     Marland Kitchen aspirin 325 MG EC tablet Take 325 mg by mouth daily.    . Baclofen 5 MG TABS Take 5 mg by mouth 4 (four) times daily. Hold if lethargic or confused     . buPROPion (WELLBUTRIN XL) 150 MG 24 hr tablet Take  150 mg by mouth daily after breakfast.    . CRANBERRY PO Take 100 mg by mouth at bedtime.     . DULoxetine (CYMBALTA) 30 MG capsule Take 90 mg by mouth daily.    . Ensure (ENSURE) Take 237 mLs by mouth 2 (two) times daily between meals.    . famotidine (PEPCID) 20 MG tablet Take 1 tablet (20 mg total) by mouth 2 (two) times daily.    . ferrous sulfate 325 (65 FE) MG tablet Take 325 mg by mouth 2 (two) times daily with a meal.     . fluticasone (FLONASE) 50 MCG/ACT nasal spray Place 2 sprays into both nostrils every evening.    . gabapentin (NEURONTIN) 800 MG tablet Take 800 mg by mouth 3 (three) times  daily.    . insulin aspart (NOVOLOG FLEXPEN) 100 UNIT/ML FlexPen 0-15 Units, Subcutaneous, 3 times daily with meals CBG < 70: implement hypoglycemia protocol-call MD CBG 70 - 120: 0 units CBG 121 - 150: 2 units CBG 151 - 200: 3 units CBG 201 - 250: 5 units CBG 251 - 300: 8 units CBG 301 - 350: 11 units CBG 351 - 400: 15 units CBG > 400: 15 mL 0  . insulin glargine (LANTUS) 100 UNIT/ML injection Inject 0.1 mLs (10 Units total) into the skin daily. 10 mL 11  . Lactobacillus (PROBIOTIC ACIDOPHILUS) CAPS Take 1 capsule by mouth daily.     Marland Kitchen latanoprost (XALATAN) 0.005 % ophthalmic solution Place 1 drop into both eyes at bedtime.    Marland Kitchen lubiprostone (AMITIZA) 24 MCG capsule Take 24 mcg by mouth 2 (two) times daily with a meal.    . Melatonin 5 MG TABS Take 10 mg by mouth at bedtime.     . metoprolol succinate (TOPROL-XL) 100 MG 24 hr tablet Take 1 tablet (100 mg total) by mouth daily. Take with or immediately following a meal. 90 tablet 3  . metoprolol succinate (TOPROL-XL) 50 MG 24 hr tablet Take 1 tablet (50 mg total) by mouth daily. Take with or immediately following a meal. 90 tablet 3  . Multiple Vitamins-Minerals (MULTIVITAMIN ADULT PO) Take 1 tablet by mouth daily.    Marland Kitchen omeprazole (PRILOSEC) 20 MG capsule Take 20 mg by mouth daily.    . ondansetron (ZOFRAN) 4 MG tablet Take 4 mg by mouth every 6 (six) hours as needed for nausea or vomiting.    . potassium chloride (KLOR-CON) 20 MEQ packet Take 20 mEq by mouth daily.     Marland Kitchen senna-docusate (SENOKOT S) 8.6-50 MG tablet Take 2 tablets by mouth 2 (two) times daily.     . sodium phosphate Pediatric (FLEET) 3.5-9.5 GM/59ML enema Place 1 enema rectally once as needed for mild constipation, moderate constipation or severe constipation.    Marland Kitchen spironolactone (ALDACTONE) 25 MG tablet Take 25 mg by mouth daily. For CHF    . tamsulosin (FLOMAX) 0.4 MG CAPS capsule Take 0.4 mg by mouth daily. For incontinence    . torsemide (DEMADEX) 20 MG tablet Take 20 mg  by mouth daily.      No current facility-administered medications for this visit.    Allergies:   Sulfonamide derivatives and Penicillins    Social History:  The patient  reports that she has quit smoking. She has a 2.50 pack-year smoking history. She has never used smokeless tobacco. She reports that she does not drink alcohol or use drugs.   Family History:  The patient's family history includes Cancer in her father and  mother; Hypertension in her mother; Multiple sclerosis in her sister.    ROS: All other systems are reviewed and negative. Unless otherwise mentioned in H&P    PHYSICAL EXAM: VS:  There were no vitals taken for this visit. , BMI There is no height or weight on file to calculate BMI. GEN: Well nourished, well developed, in no acute distress HEENT: normal Neck: no JVD, carotid bruits, or masses Cardiac: ***RRR; no murmurs, rubs, or gallops,no edema  Respiratory:  Clear to auscultation bilaterally, normal work of breathing GI: soft, nontender, nondistended, + BS MS: no deformity or atrophy Skin: warm and dry, no rash Neuro:  Strength and sensation are intact Psych: euthymic mood, full affect   EKG:  EKG {ACTION; IS/IS [id number] ordered today. The ekg ordered today demonstrates ***   Recent Labs: 05/10/2019: TSH 2.526 05/15/2019: B Natriuretic Peptide 682.7 07/21/2019: ALT 26 07/23/2019: BUN 9; Creatinine, Ser 0.83; Hemoglobin 9.7; Magnesium 2.2; Platelets 434; Potassium 4.0; Sodium 146    Lipid Panel    Component Value Date/Time   CHOL 183 12/10/2017 0000   TRIG 273 (H) 05/10/2019 1452   HDL 31 (A) 12/10/2017 0000   CHOLHDL 6.3 05/01/2012 0500   VLDL 14 05/01/2012 0500   LDLCALC 98 12/10/2017 0000      Wt Readings from Last 3 Encounters:  08/19/19 211 lb (95.7 kg)  07/21/19 219 lb 5.7 oz (99.5 kg)  05/10/19 264 lb 8.8 oz (120 kg)      Other studies Reviewed: Additional studies/ records that were reviewed today include: ***. Review of the  above records demonstrates: ***   ASSESSMENT AND PLAN:  1.  ***   Current medicines are reviewed at length with the patient today.  I have spent *** dedicated to the care of this patient on the date of this encounter to include pre-visit review of records, assessment, management and diagnostic testing,with shared decision making.  Labs/ tests ordered today include: *** Phill Myron. [place] Pugh, ANP, AACC   10/05/2019 1:31 PM    [facility] [place] [address] Office [phone] Fax [phone]  Notice: This dictation was prepared with Dragon dictation along with smaller phrase technology. Any transcriptional errors that result from this process are unintentional and may not be corrected upon review.

## 2019-10-07 ENCOUNTER — Ambulatory Visit: Payer: Medicare (Managed Care) | Admitting: Adult Health

## 2019-10-22 NOTE — Progress Notes (Signed)
Cardiology Office Note   Date:  10/26/2019   ID:  Shannon Garrett, DOB [birth year], MRN [id number]  PCP:  Gildardo Cranker, DO  Cardiologist:  Dr. Martinique  No chief complaint on file.    History of Present Illness: Shannon Garrett is a 59 y.o. female who presents for pre-operative assessment and discussion for need to have cardiac cath prior to surgery per Dr. Martinique, note on 09/22/2019.    She is being followed for ongoing assessment and management of hypertension, chronic combined heart failure, most recent echocardiogram revealing ejection fraction 45%.  Other history includes insulin-dependent type 2 diabetes, advanced multiple sclerosis with bedbound status.  She was recently discharged from the hospital on 07/23/2019 for stage II sacral decubitus ulcer.  She is a resident of a skilled nursing facility.  The patient did have a prior presurgical cardiac work-up in January 2021 and was found to be an intermediate risk due to history of nonischemic cardiomyopathy.  She underwent surgery with planned cholecystectomy.  However due to significant inflammation in the right upper quadrant and poor visualization of the gallbladder a cholecystotomy tube was placed instead.  She did have a follow-up stress Myoview on 09/17/2019 revealed moderate reversible defect involving the mid lateral wall and fixed defect involving the apex and septum.  She was also noted to have lateral inferior and apical hypokinesis with a dyskinetic septum.  It was felt by Dr. Martinique before proceeding with any new surgical intervention she would require cardiac catheterization, as surgery was not emergent.  She comes today via transportation service sitting on stretcher as she is unable to walk.  She no longer has any complaints of discomfort in her abdomen at this time.  She has multiple questions concerning the need for cardiac catheterization.  She denies recurrent chest pain, she does not have any PND or orthopnea, she is not mobile.   She is medically compliant.   Past Medical History:  Diagnosis Date  . Cardiomyopathy ([place])    a.  Echo 04/29/12: Mild LVH, EF 20-25%, mild AI, moderate MR, moderate LAE, mild RAE, mild RVE, moderate TR, PASP 51, small pericardial effusion;   b. probably non-ischemic given multiple chemo-Tx agents used for MS and global LV dysfn on echo  . Chronic systolic heart failure ([place])   . COVID-19 05/10/2019   Per facility paperwork  . Depression   . Glaucoma   . Hypertension   . MS (multiple sclerosis) ([place])    a. Dx'd late 20's. b. Tx with Novantrone, Tysabri, Copaxone previously.    Past Surgical History:  Procedure Laterality Date  . ABLATION     uterine  . CESAREAN SECTION    . CHOLECYSTECTOMY N/A 07/20/2019   Procedure: DIAGNOSTIC LAPAROSCOPY;  Surgeon: [name], MD;  Location: [facility];  Service: General;  Laterality: N/A;  . IR EXCHANGE BILIARY DRAIN  09/07/2019  . IR PERC CHOLECYSTOSTOMY  07/21/2019     Current Outpatient Medications  Medication Sig Dispense Refill  . acetaminophen (TYLENOL) 325 MG tablet Take 650 mg by mouth every 8 (eight) hours as needed for mild pain, moderate pain or headache.     . albuterol (PROVENTIL) (2.5 MG/3ML) 0.083% nebulizer solution Take 3 mLs (2.5 mg total) by nebulization every 2 (two) hours as needed for wheezing. 75 mL 12  . albuterol (VENTOLIN HFA) 108 (90 Base) MCG/ACT inhaler Inhale 2 puffs into the lungs every 6 (six) hours as needed for wheezing or shortness of breath.     Marland Kitchen amantadine (SYMMETREL)  100 MG capsule Take 100 mg by mouth daily.     . Amino Acids-Protein Hydrolys (FEEDING SUPPLEMENT, PRO-STAT SUGAR FREE 64,) LIQD Take 30 mLs by mouth 3 (three) times daily with meals.    Marland Kitchen ascorbic acid (VITAMIN C) 500 MG tablet Take 500 mg by mouth daily.     Marland Kitchen aspirin 325 MG EC tablet Take 325 mg by mouth daily.    . Baclofen 5 MG TABS Take 5 mg by mouth 4 (four) times daily. Hold if lethargic or confused     . buPROPion (WELLBUTRIN XL) 150 MG  24 hr tablet Take 150 mg by mouth daily after breakfast.    . CRANBERRY PO Take 100 mg by mouth at bedtime.     . DULoxetine (CYMBALTA) 30 MG capsule Take 90 mg by mouth daily.    . Ensure (ENSURE) Take 237 mLs by mouth 2 (two) times daily between meals.    . famotidine (PEPCID) 20 MG tablet Take 1 tablet (20 mg total) by mouth 2 (two) times daily.    . ferrous sulfate 325 (65 FE) MG tablet Take 325 mg by mouth 2 (two) times daily with a meal.     . fluticasone (FLONASE) 50 MCG/ACT nasal spray Place 2 sprays into both nostrils every evening.    . gabapentin (NEURONTIN) 800 MG tablet Take 800 mg by mouth 3 (three) times daily.    . insulin aspart (NOVOLOG FLEXPEN) 100 UNIT/ML FlexPen 0-15 Units, Subcutaneous, 3 times daily with meals CBG < 70: implement hypoglycemia protocol-call MD CBG 70 - 120: 0 units CBG 121 - 150: 2 units CBG 151 - 200: 3 units CBG 201 - 250: 5 units CBG 251 - 300: 8 units CBG 301 - 350: 11 units CBG 351 - 400: 15 units CBG > 400: 15 mL 0  . insulin glargine (LANTUS) 100 UNIT/ML injection Inject 0.1 mLs (10 Units total) into the skin daily. 10 mL 11  . Lactobacillus (PROBIOTIC ACIDOPHILUS) CAPS Take 1 capsule by mouth daily.     Marland Kitchen latanoprost (XALATAN) 0.005 % ophthalmic solution Place 1 drop into both eyes at bedtime.    Marland Kitchen lubiprostone (AMITIZA) 24 MCG capsule Take 24 mcg by mouth 2 (two) times daily with a meal.    . Melatonin 5 MG TABS Take 10 mg by mouth at bedtime.     . metoprolol succinate (TOPROL-XL) 100 MG 24 hr tablet Take 1 tablet (100 mg total) by mouth daily. Take with or immediately following a meal. 90 tablet 3  . metoprolol succinate (TOPROL-XL) 50 MG 24 hr tablet Take 1 tablet (50 mg total) by mouth daily. Take with or immediately following a meal. 90 tablet 3  . Multiple Vitamins-Minerals (MULTIVITAMIN ADULT PO) Take 1 tablet by mouth daily.    Marland Kitchen omeprazole (PRILOSEC) 20 MG capsule Take 20 mg by mouth daily.    . ondansetron (ZOFRAN) 4 MG tablet Take  4 mg by mouth every 6 (six) hours as needed for nausea or vomiting.    . potassium chloride (KLOR-CON) 20 MEQ packet Take 20 mEq by mouth daily.     Marland Kitchen senna-docusate (SENOKOT S) 8.6-50 MG tablet Take 2 tablets by mouth 2 (two) times daily.     . sodium phosphate Pediatric (FLEET) 3.5-9.5 GM/59ML enema Place 1 enema rectally once as needed for mild constipation, moderate constipation or severe constipation.    Marland Kitchen spironolactone (ALDACTONE) 25 MG tablet Take 25 mg by mouth daily. For CHF    . tamsulosin (  FLOMAX) 0.4 MG CAPS capsule Take 0.4 mg by mouth daily. For incontinence    . torsemide (DEMADEX) 20 MG tablet Take 20 mg by mouth daily.      No current facility-administered medications for this visit.    Allergies:   Sulfonamide derivatives and Penicillins    Social History:  The patient  reports that she has quit smoking. She has a 2.50 pack-year smoking history. She has never used smokeless tobacco. She reports that she does not drink alcohol or use drugs.   Family History:  The patient's family history includes Cancer in her father and mother; Hypertension in her mother; Multiple sclerosis in her sister.    ROS: All other systems are reviewed and negative. Unless otherwise mentioned in H&P    PHYSICAL EXAM: VS:  BP (!) 103/59   Pulse 75  , BMI There is no height or weight on file to calculate BMI. GEN: Well nourished, well developed, in no acute distress, lying on a stretcher. HEENT: normal Neck: no JVD, carotid bruits, or masses Cardiac: RRR; no murmurs, rubs, or gallops,no edema  Respiratory:  Clear to auscultation bilaterally, normal work of breathing, unable to fully assess lung sounds posteriorly. GI: soft, nontender, nondistended, + BS MS: no deformity or atrophy significant foot drop and mild atrophy. Skin: warm and dry, no rash Neuro:  Strength and sensation are intact Psych: euthymic mood, full affect   EKG: Not completed this office visit please see prior EKG during  recent hospitalization  Recent Labs: 05/10/2019: TSH 2.526 05/15/2019: B Natriuretic Peptide 682.7 07/21/2019: ALT 26 07/23/2019: BUN 9; Creatinine, Ser 0.83; Hemoglobin 9.7; Magnesium 2.2; Platelets 434; Potassium 4.0; Sodium 146    Lipid Panel    Component Value Date/Time   CHOL 183 12/10/2017 0000   TRIG 273 (H) 05/10/2019 1452   HDL 31 (A) 12/10/2017 0000   CHOLHDL 6.3 05/01/2012 0500   VLDL 14 05/01/2012 0500   LDLCALC 98 12/10/2017 0000      Wt Readings from Last 3 Encounters:  08/19/19 211 lb (95.7 kg)  07/21/19 219 lb 5.7 oz (99.5 kg)  05/10/19 264 lb 8.8 oz (120 kg)      Other studies Reviewed: Lexiscan Myoview  09/17/2019 IMPRESSION: 1. Moderate size reversible defect is noted involving the mid lateral wall. Fixed defect is noted involving the apex and septum.  2. Lateral, inferior wall and apical hypokinesis with dyskinetic septum.  3. Left ventricular ejection fraction 47%  4. Non invasive risk stratification*: Intermediate  Echocardiogram 07/19/2019 1. Left ventricular ejection fraction, by visual estimation, is 40 to  45%. The left ventricle has mildly decreased function. There is no  increased left ventricular wall thickness.  2. Indeterminate diastolic filling due to E-A fusion.  3. The left ventricle demonstrates global hypokinesis.  4. The right ventricle has normal systolic function.The right ventricular  size is normal.  5. The mitral valve is normal in structure. Mild mitral valve  regurgitation.  6. The tricuspid valve was normal in structure.  7. Tricuspid valve regurgitation is mild-moderate.  8. Moderately elevated pulmonary artery systolic pressure.  9. Compared to 2016 and 2019 reports, LV systolic function has  deteriorated. Side by side comparison was not available.   ASSESSMENT AND PLAN:  1.  Abnormal stress Myoview: Due to abnormality and planned pending cholecystectomy, Dr. Martinique has requested this patient be seen today  and scheduled for cardiac catheterization.  I have spoken with her at length about cardiac catheterization procedure, risks and benefits, with  possibility of intervention and need to stay overnight at the discretion of cardiology.  She is not allergic to any contrast dye.  Multiple questions were asked and answered.The patient understands that risks include but are not limited to stroke (1 in 1000), death (1 in 43), kidney failure [usually temporary] (1 in 500), bleeding (1 in 200), allergic reaction [possibly serious] (1 in 200), and agrees to proceed.   She is scheduled for cardiac catheterization at 12 PM on October 29, 2019 with Dr. Ellyn Hack.   2.  Chronic combined CHF: No evidence of fluid overload on exam today.  There is no edema, JVD, or diminished lung sounds.  She will continue her current medication regimen.  She will report any weight gain or symptoms of dyspnea, PND or orthopnea.  Precath labs are being ordered to assess kidney function on diuretics.  3. Hypertension:BP is low normal.  4. Cholelithiasis: Planned cholecystectomy after cardiac cath is completed. Uncertain date, depending on results of cath.  Current medicines are reviewed at length with the patient today.  I3 have spent 45 dedicated to the care of this patient on the date of this encounter to include pre-visit review of records, assessment, management and diagnostic testing,with shared decision making.  Labs/ tests ordered today include: Left Heart Cath   Phill Myron. West Pugh, ANP, AACC   10/26/2019 10:59 AM    [address] Office [phone] Fax [phone]  Notice: This dictation was prepared with Dragon dictation along with smaller phrase technology. Any transcriptional errors that result from this process are unintentional and may not be corrected upon review.

## 2019-10-22 NOTE — H&P (View-Only) (Signed)
Cardiology Office Note   Date:  10/26/2019   ID:  Shannon Garrett, DOB Jan 27, 1961, MRN [id number]  PCP:  Gildardo Cranker, DO  Cardiologist:  Dr. Martinique  No chief complaint on file.    History of Present Illness: Shannon Garrett is a 59 y.o. female who presents for pre-operative assessment and discussion for need to have cardiac cath prior to surgery per Dr. Martinique, note on 09/22/2019.    She is being followed for ongoing assessment and management of hypertension, chronic combined heart failure, most recent echocardiogram revealing ejection fraction 45%.  Other history includes insulin-dependent type 2 diabetes, advanced multiple sclerosis with bedbound status.  She was recently discharged from the hospital on 07/23/2019 for stage II sacral decubitus ulcer.  She is a resident of a skilled nursing facility.  The patient did have a prior presurgical cardiac work-up in January 2021 and was found to be an intermediate risk due to history of nonischemic cardiomyopathy.  She underwent surgery with planned cholecystectomy.  However due to significant inflammation in the right upper quadrant and poor visualization of the gallbladder a cholecystotomy tube was placed instead.  She did have a follow-up stress Myoview on 09/17/2019 revealed moderate reversible defect involving the mid lateral wall and fixed defect involving the apex and septum.  She was also noted to have lateral inferior and apical hypokinesis with a dyskinetic septum.  It was felt by Dr. Martinique before proceeding with any new surgical intervention she would require cardiac catheterization, as surgery was not emergent.  She comes today via transportation service sitting on stretcher as she is unable to walk.  She no longer has any complaints of discomfort in her abdomen at this time.  She has multiple questions concerning the need for cardiac catheterization.  She denies recurrent chest pain, she does not have any PND or orthopnea, she is not mobile.   She is medically compliant.   Past Medical History:  Diagnosis Date  . Cardiomyopathy ([place])    a.  Echo 04/29/12: Mild LVH, EF 20-25%, mild AI, moderate MR, moderate LAE, mild RAE, mild RVE, moderate TR, PASP 51, small pericardial effusion;   b. probably non-ischemic given multiple chemo-Tx agents used for MS and global LV dysfn on echo  . Chronic systolic heart failure ([place])   . COVID-19 05/10/2019   Per facility paperwork  . Depression   . Glaucoma   . Hypertension   . MS (multiple sclerosis) ([place])    a. Dx'd late 20's. b. Tx with Novantrone, Tysabri, Copaxone previously.    Past Surgical History:  Procedure Laterality Date  . ABLATION     uterine  . CESAREAN SECTION    . CHOLECYSTECTOMY N/A 07/20/2019   Procedure: DIAGNOSTIC LAPAROSCOPY;  Surgeon: [name], MD;  Location: [facility];  Service: General;  Laterality: N/A;  . IR EXCHANGE BILIARY DRAIN  09/07/2019  . IR PERC CHOLECYSTOSTOMY  07/21/2019     Current Outpatient Medications  Medication Sig Dispense Refill  . acetaminophen (TYLENOL) 325 MG tablet Take 650 mg by mouth every 8 (eight) hours as needed for mild pain, moderate pain or headache.     . albuterol (PROVENTIL) (2.5 MG/3ML) 0.083% nebulizer solution Take 3 mLs (2.5 mg total) by nebulization every 2 (two) hours as needed for wheezing. 75 mL 12  . albuterol (VENTOLIN HFA) 108 (90 Base) MCG/ACT inhaler Inhale 2 puffs into the lungs every 6 (six) hours as needed for wheezing or shortness of breath.     Marland Kitchen amantadine (SYMMETREL)  100 MG capsule Take 100 mg by mouth daily.     . Amino Acids-Protein Hydrolys (FEEDING SUPPLEMENT, PRO-STAT SUGAR FREE 64,) LIQD Take 30 mLs by mouth 3 (three) times daily with meals.    Marland Kitchen ascorbic acid (VITAMIN C) 500 MG tablet Take 500 mg by mouth daily.     Marland Kitchen aspirin 325 MG EC tablet Take 325 mg by mouth daily.    . Baclofen 5 MG TABS Take 5 mg by mouth 4 (four) times daily. Hold if lethargic or confused     . buPROPion (WELLBUTRIN XL) 150 MG  24 hr tablet Take 150 mg by mouth daily after breakfast.    . CRANBERRY PO Take 100 mg by mouth at bedtime.     . DULoxetine (CYMBALTA) 30 MG capsule Take 90 mg by mouth daily.    . Ensure (ENSURE) Take 237 mLs by mouth 2 (two) times daily between meals.    . famotidine (PEPCID) 20 MG tablet Take 1 tablet (20 mg total) by mouth 2 (two) times daily.    . ferrous sulfate 325 (65 FE) MG tablet Take 325 mg by mouth 2 (two) times daily with a meal.     . fluticasone (FLONASE) 50 MCG/ACT nasal spray Place 2 sprays into both nostrils every evening.    . gabapentin (NEURONTIN) 800 MG tablet Take 800 mg by mouth 3 (three) times daily.    . insulin aspart (NOVOLOG FLEXPEN) 100 UNIT/ML FlexPen 0-15 Units, Subcutaneous, 3 times daily with meals CBG < 70: implement hypoglycemia protocol-call MD CBG 70 - 120: 0 units CBG 121 - 150: 2 units CBG 151 - 200: 3 units CBG 201 - 250: 5 units CBG 251 - 300: 8 units CBG 301 - 350: 11 units CBG 351 - 400: 15 units CBG > 400: 15 mL 0  . insulin glargine (LANTUS) 100 UNIT/ML injection Inject 0.1 mLs (10 Units total) into the skin daily. 10 mL 11  . Lactobacillus (PROBIOTIC ACIDOPHILUS) CAPS Take 1 capsule by mouth daily.     Marland Kitchen latanoprost (XALATAN) 0.005 % ophthalmic solution Place 1 drop into both eyes at bedtime.    Marland Kitchen lubiprostone (AMITIZA) 24 MCG capsule Take 24 mcg by mouth 2 (two) times daily with a meal.    . Melatonin 5 MG TABS Take 10 mg by mouth at bedtime.     . metoprolol succinate (TOPROL-XL) 100 MG 24 hr tablet Take 1 tablet (100 mg total) by mouth daily. Take with or immediately following a meal. 90 tablet 3  . metoprolol succinate (TOPROL-XL) 50 MG 24 hr tablet Take 1 tablet (50 mg total) by mouth daily. Take with or immediately following a meal. 90 tablet 3  . Multiple Vitamins-Minerals (MULTIVITAMIN ADULT PO) Take 1 tablet by mouth daily.    Marland Kitchen omeprazole (PRILOSEC) 20 MG capsule Take 20 mg by mouth daily.    . ondansetron (ZOFRAN) 4 MG tablet Take  4 mg by mouth every 6 (six) hours as needed for nausea or vomiting.    . potassium chloride (KLOR-CON) 20 MEQ packet Take 20 mEq by mouth daily.     Marland Kitchen senna-docusate (SENOKOT S) 8.6-50 MG tablet Take 2 tablets by mouth 2 (two) times daily.     . sodium phosphate Pediatric (FLEET) 3.5-9.5 GM/59ML enema Place 1 enema rectally once as needed for mild constipation, moderate constipation or severe constipation.    Marland Kitchen spironolactone (ALDACTONE) 25 MG tablet Take 25 mg by mouth daily. For CHF    . tamsulosin (  FLOMAX) 0.4 MG CAPS capsule Take 0.4 mg by mouth daily. For incontinence    . torsemide (DEMADEX) 20 MG tablet Take 20 mg by mouth daily.      No current facility-administered medications for this visit.    Allergies:   Sulfonamide derivatives and Penicillins    Social History:  The patient  reports that she has quit smoking. She has a 2.50 pack-year smoking history. She has never used smokeless tobacco. She reports that she does not drink alcohol or use drugs.   Family History:  The patient's family history includes Cancer in her father and mother; Hypertension in her mother; Multiple sclerosis in her sister.    ROS: All other systems are reviewed and negative. Unless otherwise mentioned in H&P    PHYSICAL EXAM: VS:  BP (!) 103/59   Pulse 75  , BMI There is no height or weight on file to calculate BMI. GEN: Well nourished, well developed, in no acute distress, lying on a stretcher. HEENT: normal Neck: no JVD, carotid bruits, or masses Cardiac: RRR; no murmurs, rubs, or gallops,no edema  Respiratory:  Clear to auscultation bilaterally, normal work of breathing, unable to fully assess lung sounds posteriorly. GI: soft, nontender, nondistended, + BS MS: no deformity or atrophy significant foot drop and mild atrophy. Skin: warm and dry, no rash Neuro:  Strength and sensation are intact Psych: euthymic mood, full affect   EKG: Not completed this office visit please see prior EKG during  recent hospitalization  Recent Labs: 05/10/2019: TSH 2.526 05/15/2019: B Natriuretic Peptide 682.7 07/21/2019: ALT 26 07/23/2019: BUN 9; Creatinine, Ser 0.83; Hemoglobin 9.7; Magnesium 2.2; Platelets 434; Potassium 4.0; Sodium 146    Lipid Panel    Component Value Date/Time   CHOL 183 12/10/2017 0000   TRIG 273 (H) 05/10/2019 1452   HDL 31 (A) 12/10/2017 0000   CHOLHDL 6.3 05/01/2012 0500   VLDL 14 05/01/2012 0500   LDLCALC 98 12/10/2017 0000      Wt Readings from Last 3 Encounters:  08/19/19 211 lb (95.7 kg)  07/21/19 219 lb 5.7 oz (99.5 kg)  05/10/19 264 lb 8.8 oz (120 kg)      Other studies Reviewed: Lexiscan Myoview  09/17/2019 IMPRESSION: 1. Moderate size reversible defect is noted involving the mid lateral wall. Fixed defect is noted involving the apex and septum.  2. Lateral, inferior wall and apical hypokinesis with dyskinetic septum.  3. Left ventricular ejection fraction 47%  4. Non invasive risk stratification*: Intermediate  Echocardiogram 07/19/2019 1. Left ventricular ejection fraction, by visual estimation, is 40 to  45%. The left ventricle has mildly decreased function. There is no  increased left ventricular wall thickness.  2. Indeterminate diastolic filling due to E-A fusion.  3. The left ventricle demonstrates global hypokinesis.  4. The right ventricle has normal systolic function.The right ventricular  size is normal.  5. The mitral valve is normal in structure. Mild mitral valve  regurgitation.  6. The tricuspid valve was normal in structure.  7. Tricuspid valve regurgitation is mild-moderate.  8. Moderately elevated pulmonary artery systolic pressure.  9. Compared to 2016 and 2019 reports, LV systolic function has  deteriorated. Side by side comparison was not available.   ASSESSMENT AND PLAN:  1.  Abnormal stress Myoview: Due to abnormality and planned pending cholecystectomy, Dr. Martinique has requested this patient be seen today  and scheduled for cardiac catheterization.  I have spoken with her at length about cardiac catheterization procedure, risks and benefits, with  possibility of intervention and need to stay overnight at the discretion of cardiology.  She is not allergic to any contrast dye.  Multiple questions were asked and answered.The patient understands that risks include but are not limited to stroke (1 in 1000), death (1 in 53), kidney failure [usually temporary] (1 in 500), bleeding (1 in 200), allergic reaction [possibly serious] (1 in 200), and agrees to proceed.   She is scheduled for cardiac catheterization at 12 PM on October 29, 2019 with Dr. Ellyn Hack.   2.  Chronic combined CHF: No evidence of fluid overload on exam today.  There is no edema, JVD, or diminished lung sounds.  She will continue her current medication regimen.  She will report any weight gain or symptoms of dyspnea, PND or orthopnea.  Precath labs are being ordered to assess kidney function on diuretics.  3. Hypertension:BP is low normal.  4. Cholelithiasis: Planned cholecystectomy after cardiac cath is completed. Uncertain date, depending on results of cath.  Current medicines are reviewed at length with the patient today.  I3 have spent 45 dedicated to the care of this patient on the date of this encounter to include pre-visit review of records, assessment, management and diagnostic testing,with shared decision making.  Labs/ tests ordered today include: Left Heart Cath   Phill Myron. West Pugh, ANP, AACC   10/26/2019 10:59 AM    [place] [address] Office [phone] Fax [phone]  Notice: This dictation was prepared with Dragon dictation along with smaller phrase technology. Any transcriptional errors that result from this process are unintentional and may not be corrected upon review.

## 2019-10-26 ENCOUNTER — Encounter: Payer: Self-pay | Admitting: Adult Health

## 2019-10-26 ENCOUNTER — Other Ambulatory Visit: Payer: Self-pay | Admitting: Adult Health

## 2019-10-26 ENCOUNTER — Ambulatory Visit (INDEPENDENT_AMBULATORY_CARE_PROVIDER_SITE_OTHER): Payer: Medicare (Managed Care) | Admitting: Adult Health

## 2019-10-26 ENCOUNTER — Other Ambulatory Visit: Payer: Self-pay

## 2019-10-26 VITALS — BP 103/59 | HR 75

## 2019-10-26 DIAGNOSIS — R9439 Abnormal result of other cardiovascular function study: Secondary | ICD-10-CM

## 2019-10-26 DIAGNOSIS — I10 Essential (primary) hypertension: Secondary | ICD-10-CM

## 2019-10-26 DIAGNOSIS — I43 Cardiomyopathy in diseases classified elsewhere: Secondary | ICD-10-CM | POA: Diagnosis not present

## 2019-10-26 LAB — BASIC METABOLIC PANEL
BUN/Creatinine Ratio: 20 (ref 9–23)
BUN: 21 mg/dL (ref 6–24)
CO2: 24 mmol/L (ref 20–29)
Calcium: 9.3 mg/dL (ref 8.7–10.2)
Chloride: 104 mmol/L (ref 96–106)
Creatinine, Ser: 1.05 mg/dL — ABNORMAL HIGH (ref 0.57–1.00)
GFR calc Af Amer: 69 mL/min/{1.73_m2} (ref >59)
GFR calc non Af Amer: 60 mL/min/{1.73_m2} (ref >59)
Glucose: 126 mg/dL — ABNORMAL HIGH (ref 65–99)
Potassium: 3.9 mmol/L (ref 3.5–5.2)
Sodium: 144 mmol/L (ref 134–144)

## 2019-10-26 LAB — CBC
Hematocrit: 36.3 % (ref 34.0–46.6)
Hemoglobin: 12 g/dL (ref 11.1–15.9)
MCH: 27.3 pg (ref 26.6–33.0)
MCHC: 33.1 g/dL (ref 31.5–35.7)
MCV: 83 fL (ref 79–97)
Platelets: 354 10*3/uL (ref 150–450)
RBC: 4.4 x10E6/uL (ref 3.77–5.28)
RDW: 16.8 % — ABNORMAL HIGH (ref 11.7–15.4)
WBC: 8.2 10*3/uL (ref 3.4–10.8)

## 2019-10-26 NOTE — Patient Instructions (Addendum)
Medication Instructions:  Continue current medications  *If you need a refill on your cardiac medications before your next appointment, please call your pharmacy*   Lab Work: Pre Op labs  If you have labs (blood work) drawn today and your tests are completely normal, you will receive your results only by: Marland Kitchen MyChart Message (if you have MyChart) OR . A paper copy in the mail If you have any lab test that is abnormal or we need to change your treatment, we will call you to review the results.   Testing/Procedures: Your physician has requested that you have a cardiac catheterization. Cardiac catheterization is used to diagnose and/or treat various heart conditions. Doctors may recommend this procedure for a number of different reasons. The most common reason is to evaluate chest pain. Chest pain can be a symptom of coronary artery disease (CAD), and cardiac catheterization can show whether plaque is narrowing or blocking your heart's arteries. This procedure is also used to evaluate the valves, as well as measure the blood flow and oxygen levels in different parts of your heart. For further information please visit [email or web address]. Please follow instruction sheet, as given.   Follow-Up: At [facility], you and your health needs are our priority.  As part of our continuing mission to provide you with exceptional heart care, we have created designated Provider Care Teams.  These Care Teams include your primary Cardiologist (physician) and Advanced Practice Providers (APPs -  Physician Assistants and Nurse Practitioners) who all work together to provide you with the care you need, when you need it.  We recommend signing up for the patient portal called "MyChart".  Sign up information is provided on this After Visit Summary.  MyChart is used to connect with patients for Virtual Visits (Telemedicine).  Patients are able to view lab/test results, encounter notes, upcoming appointments, etc.   Non-urgent messages can be sent to your provider as well.   To learn more about what you can do with MyChart, go to [email or web address].    Your next appointment:   2 week(s)  The format for your next appointment:   In Person  Provider:   Peter Martinique, MD   Other Instructions    [place] [address] Dept: [phone] Loc: [phone]  Shannon Garrett  10/26/2019  You are scheduled for a Cardiac Catheterization on Thursday, April 29 with Dr. [name].  1. Please arrive at the [facility] (Main Entrance A) at [facility]: [address] [place], [place] [address] at 10:30 AM (This time is two hours before your procedure to ensure your preparation). Free valet parking service is available.   Special note: Every effort is made to have your procedure done on time. Please understand that emergencies sometimes delay scheduled procedures.  2. Diet: Do not eat solid foods after midnight.  The patient may have clear liquids until 5am upon the day of the procedure.  3. Labs: You will need to have blood drawn on Monday, April 26 at [facility]  Open: 8am - 5pm (Lunch 12:30 - 1:30)   Phone: [phone]. You do not need to be fasting.  4. Medication instructions in preparation for your procedure:   Contrast Allergy: No  Stop taking, Torsemide (Demadex) Thursday, April 29,  HOLD Diabetic medications day of procedure  On the morning of your procedure, take your Aspirin and any morning medicines NOT listed  above.  You may use sips of water.  5. Plan for one night stay--bring personal belongings. 6. Bring a current list of your medications and current insurance cards. 7. You MUST have a responsible person to drive you home. 8. Someone MUST be with you the first 24 hours after you arrive home or your discharge will be  delayed. 9. Please wear clothes that are easy to get on and off and wear slip-on shoes.  Thank you for allowing [place] to care for you!   -- [facility]

## 2019-10-27 NOTE — Addendum Note (Signed)
Addended by: Vennie Homans on: 10/27/2019 10:06 AM   Modules accepted: Orders

## 2019-10-28 ENCOUNTER — Telehealth: Payer: Self-pay | Admitting: *Deleted

## 2019-10-28 NOTE — Telephone Encounter (Signed)
Pt is nursing facility, call [facility] and spoke with Tammy she stated the director spoke with someone and state pt will be bringing Covid test result with her in the morning for her left heart cath procedure.

## 2019-10-29 ENCOUNTER — Other Ambulatory Visit: Payer: Self-pay

## 2019-10-29 ENCOUNTER — Ambulatory Visit (HOSPITAL_COMMUNITY)
Admission: RE | Admit: 2019-10-29 | Discharge: 2019-10-29 | Disposition: A | Discharge status: Skilled Nursing Facility | Payer: Medicare (Managed Care) | Attending: Cardiology | Admitting: Cardiology

## 2019-10-29 ENCOUNTER — Encounter (HOSPITAL_COMMUNITY)
Admission: RE | Disposition: A | Discharge status: Skilled Nursing Facility | Payer: Self-pay | Source: Home / Self Care | Attending: Cardiology

## 2019-10-29 DIAGNOSIS — Z79899 Other long term (current) drug therapy: Secondary | ICD-10-CM | POA: Diagnosis not present

## 2019-10-29 DIAGNOSIS — Z880 Allergy status to penicillin: Secondary | ICD-10-CM | POA: Diagnosis not present

## 2019-10-29 DIAGNOSIS — G35 Multiple sclerosis: Secondary | ICD-10-CM | POA: Insufficient documentation

## 2019-10-29 DIAGNOSIS — I5042 Chronic combined systolic (congestive) and diastolic (congestive) heart failure: Secondary | ICD-10-CM | POA: Insufficient documentation

## 2019-10-29 DIAGNOSIS — I110 Hypertensive heart disease with heart failure: Secondary | ICD-10-CM | POA: Insufficient documentation

## 2019-10-29 DIAGNOSIS — F329 Major depressive disorder, single episode, unspecified: Secondary | ICD-10-CM | POA: Insufficient documentation

## 2019-10-29 DIAGNOSIS — K8020 Calculus of gallbladder without cholecystitis without obstruction: Secondary | ICD-10-CM | POA: Diagnosis not present

## 2019-10-29 DIAGNOSIS — R9439 Abnormal result of other cardiovascular function study: Secondary | ICD-10-CM

## 2019-10-29 DIAGNOSIS — Z794 Long term (current) use of insulin: Secondary | ICD-10-CM | POA: Insufficient documentation

## 2019-10-29 DIAGNOSIS — Z7982 Long term (current) use of aspirin: Secondary | ICD-10-CM | POA: Diagnosis not present

## 2019-10-29 DIAGNOSIS — E119 Type 2 diabetes mellitus without complications: Secondary | ICD-10-CM | POA: Diagnosis not present

## 2019-10-29 DIAGNOSIS — L89152 Pressure ulcer of sacral region, stage 2: Secondary | ICD-10-CM | POA: Diagnosis not present

## 2019-10-29 DIAGNOSIS — I420 Dilated cardiomyopathy: Secondary | ICD-10-CM | POA: Diagnosis not present

## 2019-10-29 DIAGNOSIS — Z87891 Personal history of nicotine dependence: Secondary | ICD-10-CM | POA: Diagnosis not present

## 2019-10-29 DIAGNOSIS — Z882 Allergy status to sulfonamides status: Secondary | ICD-10-CM | POA: Diagnosis not present

## 2019-10-29 DIAGNOSIS — Z01810 Encounter for preprocedural cardiovascular examination: Secondary | ICD-10-CM

## 2019-10-29 HISTORY — PX: LEFT HEART CATH AND CORONARY ANGIOGRAPHY: CATH118249

## 2019-10-29 LAB — GLUCOSE, CAPILLARY: Glucose-Capillary: 146 mg/dL — ABNORMAL HIGH (ref 70–99)

## 2019-10-29 SURGERY — LEFT HEART CATH AND CORONARY ANGIOGRAPHY
Anesthesia: LOCAL

## 2019-10-29 MED ORDER — LIDOCAINE HCL (PF) 1 % IJ SOLN
INTRAMUSCULAR | Status: AC
  Filled 2019-10-29: qty 30

## 2019-10-29 MED ORDER — SODIUM CHLORIDE 0.9 % IV SOLN: INTRAVENOUS | Status: DC

## 2019-10-29 MED ORDER — SODIUM CHLORIDE 0.9% FLUSH: 3.0000 mL | Freq: Two times a day (BID) | INTRAVENOUS | Status: DC

## 2019-10-29 MED ORDER — SODIUM CHLORIDE 0.9 % IV SOLN: 250.0000 mL | INTRAVENOUS | Status: DC | PRN

## 2019-10-29 MED ORDER — HEPARIN (PORCINE) IN NACL 1000-0.9 UT/500ML-% IV SOLN
INTRAVENOUS | Status: DC | PRN
  Administered 2019-10-29 (×2): 500 mL

## 2019-10-29 MED ORDER — HEPARIN (PORCINE) IN NACL 1000-0.9 UT/500ML-% IV SOLN
INTRAVENOUS | Status: AC
  Filled 2019-10-29: qty 500

## 2019-10-29 MED ORDER — IOHEXOL 350 MG/ML SOLN
INTRAVENOUS | Status: DC | PRN
  Administered 2019-10-29: 50 mL

## 2019-10-29 MED ORDER — ASPIRIN 81 MG PO CHEW
81.0000 mg | CHEWABLE_TABLET | ORAL | Status: AC
  Administered 2019-10-29: 12:00:00 81 mg via ORAL
  Filled 2019-10-29: qty 1

## 2019-10-29 MED ORDER — VERAPAMIL HCL 2.5 MG/ML IV SOLN
INTRAVENOUS | Status: DC | PRN
  Administered 2019-10-29: 10 mL via INTRA_ARTERIAL

## 2019-10-29 MED ORDER — FENTANYL CITRATE (PF) 100 MCG/2ML IJ SOLN
INTRAMUSCULAR | Status: DC | PRN
  Administered 2019-10-29: 25 ug via INTRAVENOUS

## 2019-10-29 MED ORDER — SODIUM CHLORIDE 0.9 % IV SOLN
INTRAVENOUS | Status: AC | PRN
  Administered 2019-10-29: 10 mL/h via INTRAVENOUS

## 2019-10-29 MED ORDER — MIDAZOLAM HCL 2 MG/2ML IJ SOLN
INTRAMUSCULAR | Status: DC | PRN
  Administered 2019-10-29: 1 mg via INTRAVENOUS

## 2019-10-29 MED ORDER — HEPARIN SODIUM (PORCINE) 1000 UNIT/ML IJ SOLN
INTRAMUSCULAR | Status: DC | PRN
  Administered 2019-10-29: 4000 [IU] via INTRAVENOUS

## 2019-10-29 MED ORDER — LIDOCAINE HCL (PF) 1 % IJ SOLN
INTRAMUSCULAR | Status: DC | PRN
  Administered 2019-10-29: 2 mL

## 2019-10-29 MED ORDER — HEPARIN SODIUM (PORCINE) 1000 UNIT/ML IJ SOLN
INTRAMUSCULAR | Status: AC
  Filled 2019-10-29: qty 1

## 2019-10-29 MED ORDER — VERAPAMIL HCL 2.5 MG/ML IV SOLN
INTRAVENOUS | Status: AC
  Filled 2019-10-29: qty 2

## 2019-10-29 MED ORDER — SODIUM CHLORIDE 0.9% FLUSH: 3.0000 mL | INTRAVENOUS | Status: DC | PRN

## 2019-10-29 MED ORDER — MIDAZOLAM HCL 2 MG/2ML IJ SOLN
INTRAMUSCULAR | Status: AC
  Filled 2019-10-29: qty 2

## 2019-10-29 MED ORDER — FENTANYL CITRATE (PF) 100 MCG/2ML IJ SOLN
INTRAMUSCULAR | Status: AC
  Filled 2019-10-29: qty 2

## 2019-10-29 SURGICAL SUPPLY — 10 items
CATH OPTITORQUE TIG 4.0 5F (CATHETERS) ×1 IMPLANT
DEVICE RAD COMP TR BAND LRG (VASCULAR PRODUCTS) ×1 IMPLANT
GLIDESHEATH SLEND SS 6F .021 (SHEATH) ×1 IMPLANT
GUIDEWIRE INQWIRE 1.5J.035X260 (WIRE) IMPLANT
INQWIRE 1.5J .035X260CM (WIRE) ×2
KIT HEART LEFT (KITS) ×2 IMPLANT
PACK CARDIAC CATHETERIZATION (CUSTOM PROCEDURE TRAY) ×2 IMPLANT
SHEATH PROBE COVER 6X72 (BAG) ×1 IMPLANT
TRANSDUCER W/STOPCOCK (MISCELLANEOUS) ×2 IMPLANT
TUBING CIL FLEX 10 FLL-RA (TUBING) ×2 IMPLANT

## 2019-10-29 NOTE — Progress Notes (Signed)
[facility] here to transport client back to [facility]; IV removed from right forearm; after client left, noted IV charted left hand; called client at [facility] and then called her nurse and she stated she will have the director of nursing remove IV; spoke to client again and she state the nurse told her someone will be in to remove IV

## 2019-10-29 NOTE — Discharge Instructions (Signed)
Radial Site Care  This sheet gives you information about how to care for yourself after your procedure. Your health care provider may also give you more specific instructions. If you have problems or questions, contact your health care provider. What can I expect after the procedure? After the procedure, it is common to have:  Bruising and tenderness at the catheter insertion area. Follow these instructions at home: Medicines  Take over-the-counter and prescription medicines only as told by your health care provider. Insertion site care  Follow instructions from your health care provider about how to take care of your insertion site. Make sure you: ? Wash your hands with soap and water before you change your bandage (dressing). If soap and water are not available, use hand sanitizer. ? Change your dressing as told by your health care provider. ? Leave stitches (sutures), skin glue, or adhesive strips in place. These skin closures may need to stay in place for 2 weeks or longer. If adhesive strip edges start to loosen and curl up, you may trim the loose edges. Do not remove adhesive strips completely unless your health care provider tells you to do that.  Check your insertion site every day for signs of infection. Check for: ? Redness, swelling, or pain. ? Fluid or blood. ? Pus or a bad smell. ? Warmth.  Do not take baths, swim, or use a hot tub until your health care provider approves.  You may shower 24-48 hours after the procedure, or as directed by your health care provider. ? Remove the dressing and gently wash the site with plain soap and water. ? Pat the area dry with a clean towel. ? Do not rub the site. That could cause bleeding.  Do not apply powder or lotion to the site. Activity   For 24 hours after the procedure, or as directed by your health care provider: ? Do not flex or bend the affected arm. ? Do not push or pull heavy objects with the affected arm. ? Do not  drive yourself home from the hospital or clinic. You may drive 24 hours after the procedure unless your health care provider tells you not to. ? Do not operate machinery or power tools.  Do not lift anything that is heavier than 10 lb (4.5 kg), or the limit that you are told, until your health care provider says that it is safe.  Ask your health care provider when it is okay to: ? Return to work or school. ? Resume usual physical activities or sports. ? Resume sexual activity. General instructions  If the catheter site starts to bleed, raise your arm and put firm pressure on the site. If the bleeding does not stop, get help right away. This is a medical emergency.  If you went home on the same day as your procedure, a responsible adult should be with you for the first 24 hours after you arrive home.  Keep all follow-up visits as told by your health care provider. This is important. Contact a health care provider if:  You have a fever.  You have redness, swelling, or yellow drainage around your insertion site. Get help right away if:  You have unusual pain at the radial site.  The catheter insertion area swells very fast.  The insertion area is bleeding, and the bleeding does not stop when you hold steady pressure on the area.  Your arm or hand becomes pale, cool, tingly, or numb. These symptoms may represent a serious problem   that is an emergency. Do not wait to see if the symptoms will go away. Get medical help right away. Call your local emergency services (911 in the [place] Do not drive yourself to the hospital. Summary  After the procedure, it is common to have bruising and tenderness at the site.  Follow instructions from your health care provider about how to take care of your radial site wound. Check the wound every day for signs of infection.  Do not lift anything that is heavier than 10 lb (4.5 kg), or the limit that you are told, until your health care provider says  that it is safe. This information is not intended to replace advice given to you by your health care provider. Make sure you discuss any questions you have with your health care provider. Document Revised: 07/24/2017 Document Reviewed: 07/24/2017 Elsevier Patient Education  2020 [employer]  

## 2019-10-29 NOTE — Interval H&P Note (Signed)
History and Physical Interval Note:  10/29/2019 12:25 PM  Shannon Garrett  has presented today for surgery, with the diagnosis of abnormal sttress test.  The various methods of treatment have been discussed with the patient and family. After consideration of risks, benefits and other options for treatment, the patient has consented to  Procedure(s): LEFT HEART CATH AND CORONARY ANGIOGRAPHY (N/A)  PERCUTANEOUS CORONARY INTERVENTION  as a surgical intervention.  The patient's history has been reviewed, patient examined, no change in status, stable for surgery.  I have reviewed the patient's chart and labs.  Questions were answered to the patient's satisfaction.     [name]

## 2019-10-29 NOTE — Progress Notes (Signed)
Discharge instructions reviewed with care taker at facility[facility]. Verbalized understanding. Will arrange for transport back to [place] later this afternoon.

## 2019-10-29 NOTE — Progress Notes (Signed)
Called client and she states they did remove IV from left hand

## 2019-10-29 NOTE — Progress Notes (Signed)
Attempted to call facility x 2 to go over last doses of medications. No answer, voicemail left.

## 2019-11-10 ENCOUNTER — Telehealth: Payer: Self-pay | Admitting: Cardiology

## 2019-11-10 NOTE — Telephone Encounter (Signed)
   Primary Cardiologist: Peter Martinique, MD  Chart reviewed as part of pre-operative protocol coverage. Patient was recently seen by Jory Sims for pre-op clearance. Cardiac catheterization was arranged due to abnormal stress test. Do not see follow-up notation of clearance. 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 Patient no-showed to post-cath follow-up visit.  Dr. Kristeen Miss, can you provide commentary on official clearance decision and aspirin? On 325mg  but not for cardiac reasons I can find. Please route response to P [facility] PREOP (the pre-op pool). Thank you.  Charlie Pitter, PA-C 11/10/2019, 2:20 PM

## 2019-11-10 NOTE — Telephone Encounter (Signed)
Definitely cleared for procedure. I don't see an indication for ASA.  Euclid Cassetta Martinique MD, [facility]

## 2019-11-10 NOTE — Telephone Encounter (Signed)
   NP Stephania Fragmin from [facility] called, she said [facility] haven't receive pt's clearance for Laparoscopic cholecystectomy.   Please advise

## 2019-11-10 NOTE — Telephone Encounter (Signed)
   Primary Cardiologist: Peter Martinique, MD  Chart revisited as part of pre-operative protocol coverage. Per Dr. Martinique, patient is cleared to proceed with procedure as requested. She is noted to be on full dose aspirin but not for cardiac reasons. Therefore, OK to hold from our standpoint. I will route this note to Dr. Doug Sou nurse Malachy Mood to suggest to patient that she discuss the long term plan for her aspirin regimen with her PCP in case they have her on it for other reasons. The patient also no-showed for her follow-up visit and so should be offered rescheduling.  Will otherwise route this bundled recommendation to requesting provider from original 08/2019 clearance via Epic fax function. Please call with questions.   Charlie Pitter, PA-C 11/10/2019, 3:28 PM

## 2019-11-25 NOTE — Telephone Encounter (Signed)
Called patient no answer.LMTC. 

## 2019-12-17 NOTE — Telephone Encounter (Signed)
Patient never returned call from 5/26.Tried calling patient at # listed in chart [phone].# not working.

## 2020-01-13 ENCOUNTER — Ambulatory Visit: Payer: Self-pay | Admitting: Surgery

## 2020-01-13 NOTE — H&P (Signed)
Surgical Evaluation  HPI: 59 year old woman returns for planning of cholecystectomy.  She was admitted to the hospital in January of this year with cholecystitis.  The inflammation was so significant that surgery was aborted and she underwent percutaneous cholecystostomy tube.  She is no longer having any pain from her gallbladder.  She did have the tube replaced about a week ago and purulent aspirate was noted.  She states that the tube is aggravating.  She does suffer with chronic constipation due to pain meds that she takes for her multiple sclerosis but otherwise denies GI issues, denies fevers.  She did recently see her cardiologist, and noted her ejection fraction has improved to 40-45% on most recent echo.  She is scheduled to have a nuclear stress test in the near future.  Allergies  Allergen Reactions   Sulfonamide Derivatives Hives and Shortness Of Breath   Penicillins Hives and Itching    Has patient had a PCN reaction causing immediate rash, facial/tongue/throat swelling, SOB or lightheadedness with hypotension: unknown Has patient had a PCN reaction causing severe rash involving mucus membranes or skin necrosis: unknown Has patient had a PCN reaction that required hospitalization: unknown Has patient had a PCN reaction occurring within the last 10 years: unknown If all of the above answers are "NO", then may proceed with Cephalosporin use. Prior course of rocephin charted 05/2015    Past Medical History:  Diagnosis Date   Cardiomyopathy [facility])    a.  Echo 04/29/12: Mild LVH, EF 20-25%, mild AI, moderate MR, moderate LAE, mild RAE, mild RVE, moderate TR, PASP 51, small pericardial effusion;   b. probably non-ischemic given multiple chemo-Tx agents used for MS and global LV dysfn on echo   Chronic systolic heart failure ([place])    COVID-19 05/10/2019   Per facility paperwork   Depression    Glaucoma    Hypertension    MS (multiple sclerosis) ([place])    a. Dx'd late 20's. b. Tx with  Novantrone, Tysabri, Copaxone previously.    Past Surgical History:  Procedure Laterality Date   ABLATION     uterine   CESAREAN SECTION     CHOLECYSTECTOMY N/A 07/20/2019   Procedure: DIAGNOSTIC LAPAROSCOPY;  Surgeon: [name], MD;  Location: [facility];  Service: General;  Laterality: N/A;   IR EXCHANGE BILIARY DRAIN  09/07/2019   IR PERC CHOLECYSTOSTOMY  07/21/2019   LEFT HEART CATH AND CORONARY ANGIOGRAPHY N/A 10/29/2019   Procedure: LEFT HEART CATH AND CORONARY ANGIOGRAPHY;  Surgeon: Leonie Man, MD;  Location: [place] [facility];  Service: Cardiovascular;  Laterality: N/A;    Family History  Problem Relation Age of Onset   Hypertension Mother    Cancer Mother        breast    Cancer Father        prostate   Multiple sclerosis Sister    Heart attack Neg Hx     Social History   Socioeconomic History   Marital status: Single    Spouse name: Not on file   Number of children: Not on file   Years of education: Not on file   Highest education level: Not on file  Occupational History   Not on file  Tobacco Use   Smoking status: Former Smoker    Packs/day: 0.50    Years: 5.00    Pack years: 2.50   Smokeless tobacco: Never Used  Scientific laboratory technician Use: Never used  Substance and Sexual Activity   Alcohol  use: No   Drug use: No   Sexual activity: Not Currently  Other Topics Concern   Not on file  Social History Narrative   Not on file   Social Determinants of Health   Financial Resource Strain:    Difficulty of Paying Living Expenses:   Food Insecurity:    Worried About Charity fundraiser in the Last Year:    Arboriculturist in the Last Year:   Transportation Needs:    Film/video editor (Medical):    Lack of Transportation (Non-Medical):   Physical Activity:    Days of Exercise per Week:    Minutes of Exercise per Session:   Stress:    Feeling of Stress :   Social Connections:    Frequency of Communication with Friends and Family:    Frequency  of Social Gatherings with Friends and Family:    Attends Religious Services:    Active Member of Clubs or Organizations:    Attends Music therapist:    Marital Status:     Current Outpatient Medications on File Prior to Visit  Medication Sig Dispense Refill   acetaminophen (TYLENOL) 325 MG tablet Take 650 mg by mouth every 8 (eight) hours as needed for mild pain, moderate pain or headache.      albuterol (PROVENTIL) (2.5 MG/3ML) 0.083% nebulizer solution Take 3 mLs (2.5 mg total) by nebulization every 2 (two) hours as needed for wheezing. 75 mL 12   albuterol (VENTOLIN HFA) 108 (90 Base) MCG/ACT inhaler Inhale 2 puffs into the lungs every 6 (six) hours as needed for wheezing or shortness of breath.      amantadine (SYMMETREL) 100 MG capsule Take 100 mg by mouth daily.      ascorbic acid (VITAMIN C) 500 MG tablet Take 500 mg by mouth daily.      aspirin 325 MG EC tablet Take 325 mg by mouth daily.     Baclofen 5 MG TABS Take 5 mg by mouth 4 (four) times daily. Hold if lethargic or confused      buPROPion (WELLBUTRIN XL) 150 MG 24 hr tablet Take 150 mg by mouth daily after breakfast.     Cranberry 400 MG TABS Take 400 mg by mouth daily.      DULoxetine HCl 60 MG CSDR Take 60 mg by mouth daily.      Ensure (ENSURE) Take 237 mLs by mouth 2 (two) times daily between meals.     famotidine (PEPCID) 20 MG tablet Take 1 tablet (20 mg total) by mouth 2 (two) times daily.     ferrous sulfate 325 (65 FE) MG tablet Take 325 mg by mouth 2 (two) times daily with a meal.      fluticasone (FLONASE) 50 MCG/ACT nasal spray Place 2 sprays into both nostrils every evening.     gabapentin (NEURONTIN) 800 MG tablet Take 800 mg by mouth 3 (three) times daily.     insulin aspart (NOVOLOG FLEXPEN) 100 UNIT/ML FlexPen 0-15 Units, Subcutaneous, 3 times daily with meals CBG < 70: implement hypoglycemia protocol-call MD CBG 70 - 120: 0 units CBG 121 - 150: 2 units CBG 151 - 200: 3 units CBG 201 - 250: 5  units CBG 251 - 300: 8 units CBG 301 - 350: 11 units CBG 351 - 400: 15 units CBG > 400: (Patient taking differently: Inject 0-15 Units into the skin 3 (three) times daily with meals. 0-15 Units, Subcutaneous, 3 times daily with meals CBG <  70: implement hypoglycemia protocol-call MD CBG 70 - 120: 0 units CBG 121 - 150: 2 units CBG 151 - 200: 3 units CBG 201 - 250: 5 units CBG 251 - 300: 8 units CBG 301 - 350: 11 units CBG 351 - 400: 15 units CBG > 400:) 15 mL 0   insulin glargine (LANTUS) 100 UNIT/ML injection Inject 0.1 mLs (10 Units total) into the skin daily. (Patient taking differently: Inject 10 Units into the skin at bedtime. ) 10 mL 11   Lactobacillus (PROBIOTIC ACIDOPHILUS) CAPS Take 1 capsule by mouth daily.      latanoprost (XALATAN) 0.005 % ophthalmic solution Place 1 drop into both eyes at bedtime.     lubiprostone (AMITIZA) 24 MCG capsule Take 24 mcg by mouth 2 (two) times daily with a meal.     Melatonin 5 MG TABS Take 10 mg by mouth at bedtime.      metoprolol succinate (TOPROL-XL) 100 MG 24 hr tablet Take 1 tablet (100 mg total) by mouth daily. Take with or immediately following a meal. (Patient taking differently: Take 150 mg by mouth daily. Take with or immediately following a meal.) 90 tablet 3   Multiple Vitamins-Minerals (MULTIVITAMIN ADULT PO) Take 1 tablet by mouth daily.     omeprazole (PRILOSEC) 20 MG capsule Take 20 mg by mouth daily.     ondansetron (ZOFRAN) 4 MG tablet Take 4 mg by mouth every 6 (six) hours as needed for nausea or vomiting.     potassium chloride (KLOR-CON) 20 MEQ packet Take 20 mEq by mouth daily.      senna-docusate (SENOKOT S) 8.6-50 MG tablet Take 2 tablets by mouth 2 (two) times daily.      simethicone (MYLICON) 267 MG chewable tablet Chew 125 mg by mouth every 6 (six) hours as needed for flatulence.     sodium phosphate Pediatric (FLEET) 3.5-9.5 GM/59ML enema Place 1 enema rectally once as needed for mild constipation, moderate constipation  or severe constipation.     spironolactone (ALDACTONE) 25 MG tablet Take 25 mg by mouth daily. For CHF     tamsulosin (FLOMAX) 0.4 MG CAPS capsule Take 0.4 mg by mouth daily. For incontinence     torsemide (DEMADEX) 20 MG tablet Take 20 mg by mouth daily.      traMADol (ULTRAM) 50 MG tablet Take 100 mg by mouth every 6 (six) hours as needed (Pain).     No current facility-administered medications on file prior to visit.    Review of Systems: a complete, 10pt review of systems was completed with pertinent positives and negatives as documented in the HPI  Physical Exam: Vitals Weight: 270 lb   Height: 65 in  Body Surface Area: 2.25 m   Body Mass Index: 44.93 kg/m   Temp.: 97.1 F    Pulse: 85 (Regular)    P.OX: 97% (Room air) BP: 108/72(Sitting, Left Arm, Standard)       Physical Exam  Alert and cooperative Unlabored respirations Obese female, on a stretcher. Abdomen is soft and nontender. Drain in right flank with mostly serous output, some purulent tinge.   CBC Latest Ref Rng & Units 10/26/2019 07/23/2019 07/22/2019  WBC 3.4 - 10.8 x10E3/uL 8.2 11.2(H) 15.6(H)  Hemoglobin 11.1 - 15.9 g/dL 12.0 9.7(L) 10.4(L)  Hematocrit 34.0 - 46.6 % 36.3 31.3(L) 33.3(L)  Platelets 150 - 450 x10E3/uL 354 434(H) 493(H)    CMP Latest Ref Rng & Units 10/26/2019 07/23/2019 07/21/2019  Glucose 65 - 99 mg/dL 126(H) 81 150(H)  BUN 6 - 24 mg/dL 21 9 11   Creatinine 0.57 - 1.00 mg/dL 1.05(H) 0.83 0.90  Sodium 134 - 144 mmol/L 144 146(H) 143  Potassium 3.5 - 5.2 mmol/L 3.9 4.0 4.1  Chloride 96 - 106 mmol/L 104 110 109  CO2 20 - 29 mmol/L 24 26 24   Calcium 8.7 - 10.2 mg/dL 9.3 9.0 8.7(L)  Total Protein 6.5 - 8.1 g/dL - - 6.7  Total Bilirubin 0.3 - 1.2 mg/dL - - 0.5  Alkaline Phos 38 - 126 U/L - - 352(H)  AST 15 - 41 U/L - - 16  ALT 0 - 44 U/L - - 26    Lab Results  Component Value Date   INR 1.2 07/21/2019   INR 1.0 05/12/2019   INR 1.1 05/10/2019    Imaging: No results found.   A/P:  CHOLECYSTITIS (K81.9) Story: Status post percutaneous cholecystostomy tube in January. Tube replaced last week, with purulent aspirate noted. I recommend proceeding with laparoscopic cholecystectomy with possible cholangiogram. Discussed risks of surgery including bleeding, pain, scarring, intraabdominal injury specifically to the common bile duct and sequelae, conversion to open surgery, failure to resolve symptoms, blood clots/ pulmonary embolus, heart attack, pneumonia, stroke, death. It is possible that the scarring and inflammation in the right upper quadrant will preclude surgery again, or require a fenestrated cholecystectomy and surgical drain placement. We discussed that and that it entails increased risk of bile leak which would require ERCP and stent to treat. Questions welcomed and answered to patient's satisfaction. She will need cardiac clearance prior to surgery  Update: cardiac clearance per Dayna Dunn PA-C, ok to hold ASA. Cardiac cath 10/29/19 normal. NICM EF 45%    Patient Active Problem List   Diagnosis Date Noted   Abnormal nuclear stress test    Preoperative cardiovascular examination    Acute calculous cholecystitis 07/19/2019   Insulin dependent diabetes mellitus type IA ([place]) 07/19/2019   History of Clostridioides difficile colitis 07/19/2019   Multiple drug resistant organism (MDRO) Klebsiella pneumonia UTI culture positive 07/19/2019   Enteritis due to Clostridium difficile 06/24/2019   COVID-19 virus infection 06/24/2019   Palliative care by specialist    DNR (do not resuscitate)    Sepsis ([place]) 05/10/2019   History of COVID-19 Nov2020 05/10/2019   Dysesthesia 08/26/2018   Dyslipidemia 06/13/2017   Elevated alkaline phosphatase level 06/13/2017   Acute frontal sinusitis 05/24/2017   Influenza 04/19/2017   Glaucoma 04/06/2017   Allergic rhinitis due to allergen 03/07/2017   Vaginal candidiasis 03/01/2017   UTI (urinary tract infection) 01/02/2017   High risk  medication use 01/25/2016   Neuropathy 11/13/2015   GERD (gastroesophageal reflux disease) 11/13/2015   Dysphagia as late effect of stroke 11/08/2015   Adrenal insufficiency (Addison's disease) ([place]) 08/12/2015   Vitamin D deficiency 06/18/2015   Hypokalemia 06/03/2015   Chronic anemia 05/30/2015   Chronic pain syndrome 03/03/2015   Essential hypertension    Dilated cardiomyopathy ([place])    Chronic constipation 11/26/2014   Depression 11/26/2014   Adult failure to thrive 07/31/2013   Insomnia 06/03/2013   Neurogenic bladder 06/03/2013   Paraplegia ([place]) 09/05/2012   Chronic diastolic CHF (congestive heart failure) ([place]) 05/22/2012   Obesity 11/30/2008   MS (multiple sclerosis) ([place]) 11/30/2008   Hypertensive heart disease with CHF (congestive heart failure) ([place]) 11/30/2008       Romana Juniper, MD [facility]  See AMION to contact appropriate on-call provider

## 2020-01-26 ENCOUNTER — Telehealth: Payer: Self-pay | Admitting: Cardiology

## 2020-01-26 NOTE — Telephone Encounter (Signed)
LVM for patient to get scheduled with [place] from recall list

## 2020-02-08 ENCOUNTER — Encounter (HOSPITAL_COMMUNITY): Payer: Self-pay | Admitting: Surgery

## 2020-02-08 NOTE — Progress Notes (Addendum)
COVID Vaccine Completed: Yes Date COVID Vaccine completed: 08/06/19 COVID vaccine manufacturer:     [place]     PCP - Dr. Gildardo Cranker Cardiologist - Dr. [place], last office visit pre op evaluation  note K. Lawrence 10/26/19 in epic  Chest x-ray - 05/10/2019 in epic EKG - 07/20/19 in epic Stress Test - 09/17/2019 ECHO - 07/19/2019 Cardiac Cath - 10/29/2019  Sleep Study -  CPAP -   Fasting Blood Sugar - 70's-80's Checks Blood Sugar __3___ times a day  Blood Thinner Instructions: N/A Aspirin Instructions: No Last Dose: still currently taking   Anesthesia review: MS, CHF,   Patient denies shortness of breath, fever, cough and chest pain at PAT appointment   Patient verbalized understanding of instructions that were given to them at the PAT appointment. Patient was also instructed that they will need to review over the PAT instructions again at home before surgery.

## 2020-02-08 NOTE — Patient Instructions (Addendum)
Preop instructions for:     Shannon Garrett Date of Birth: [birth year]                       Date of Procedure:   Friday, Aug. 13, 2021 Procedure:     Cholecystectomy Surgeon: Dr. Court Joy Facility contact:     Phone:  [place]: RN contact name/phone#: Colletta [place] Unit Manager                         and Fax #: [phone]   Transportation contact phone#: Wandra Feinstein    Time to arrive at [facility],  report to Admitting at   : 7:00 AM   Do not eat or drink past midnight the night before your procedure.(To include any tube feedings-must be discontinued)   Take these morning medications only with sips of water.(or give through gastrostomy or feeding tube). Amantadine, Bupropion, Duloxetine, Famotidine, Gabapentin, Amitza, Metoprolol, Tamsulosin, Omeprazole,    The Night Before Surgery: Take only  5 units of Lantus at bedtime.   Note: No Insulin or Diabetic meds should be given or taken the morning of the procedure!   Please send day of procedure:current med list and meds last taken that day, confirm nothing by mouth status from what time, Patient Demographic info( to include DNR status, problem list, allergies)   Bring Insurance card and picture ID Leave all jewelry and other valuables at place where living( no metal or rings to be worn) No contact lens Women-no make-up, no lotions,perfumes,powders Men-no colognes,lotions   Any questions day of procedure,call  [facility] STAY-[phone]     Sent from :[facility]                   Phone:[phone]                   Fax:[phone]   Sent by :    Harlon Flor BSN,           RN

## 2020-02-09 ENCOUNTER — Inpatient Hospital Stay (HOSPITAL_COMMUNITY): Admission: RE | Admit: 2020-02-09 | Payer: Medicare (Managed Care) | Source: Ambulatory Visit

## 2020-02-09 NOTE — Anesthesia Preprocedure Evaluation (Addendum)
Anesthesia Evaluation  Patient identified by MRN, date of birth, ID band Patient awake    Reviewed: Allergy & Precautions, NPO status , Patient's Chart, lab work & pertinent test results, reviewed documented beta blocker date and time   Airway Mallampati: III  TM Distance: >3 FB Neck ROM: Full    Dental  (+) Poor Dentition, Missing   Pulmonary former smoker,    Pulmonary exam normal breath sounds clear to auscultation       Cardiovascular hypertension, Pt. on home beta blockers +CHF  Normal cardiovascular exam Rhythm:Regular Rate:Normal  Echo 07/19/19: 1. Left ventricular ejection fraction, by visual estimation, is 40 to  45%. The left ventricle has mildly decreased function. There is no  increased left ventricular wall thickness.  2. Indeterminate diastolic filling due to E-A fusion.  3. The left ventricle demonstrates global hypokinesis.  4. The right ventricle has normal systolic function.The right ventricular  size is normal.  5. The mitral valve is normal in structure. Mild mitral valve  regurgitation.  6. The tricuspid valve was normal in structure.  7. Tricuspid valve regurgitation is mild-moderate.  8. Moderately elevated pulmonary artery systolic pressure.  9. Compared to 2016 and 2019 reports, LV systolic function has  deteriorated. Side by side comparison was not available.    Neuro/Psych PSYCHIATRIC DISORDERS Depression Multiple sclerosis     GI/Hepatic Neg liver ROS, GERD  Medicated,  Endo/Other  diabetes, Type 2, Insulin DependentMorbid obesity  Renal/GU negative Renal ROS     Musculoskeletal negative musculoskeletal ROS (+)   Abdominal   Peds  Hematology negative hematology ROS (+)   Anesthesia Other Findings   Reproductive/Obstetrics                                                           Anesthesia Evaluation  Patient identified by MRN, date of birth, ID  band Patient awake    Reviewed: Allergy & Precautions, NPO status , Patient's Chart, lab work & pertinent test results, reviewed documented beta blocker date and time   Airway Mallampati: II  TM Distance: >3 FB Neck ROM: Full    Dental  (+) Dental Advisory Given, Poor Dentition, Missing   Pulmonary neg pulmonary ROS, former smoker,  COVID-19 + on 05/10/2019   Pulmonary exam normal breath sounds clear to auscultation       Cardiovascular hypertension, Pt. on home beta blockers +CHF  + dysrhythmias (LBBB) Supra Ventricular Tachycardia  Rhythm:Regular Rate:Tachycardia  Echocardiogram this admission showed LV function of 40 to 45%   Neuro/Psych PSYCHIATRIC DISORDERS Depression MS negative neurological ROS     GI/Hepatic Neg liver ROS, GERD  Medicated,Gallstones    Endo/Other  diabetes, Type 1, Insulin Dependent  Renal/GU negative Renal ROS     Musculoskeletal negative musculoskeletal ROS (+)   Abdominal   Peds  Hematology  (+) Blood dyscrasia, anemia ,   Anesthesia Other Findings Day of surgery medications reviewed with the patient.  Reproductive/Obstetrics                            Anesthesia Physical Anesthesia Plan  ASA: IV  Anesthesia Plan: General   Post-op Pain Management:    Induction: Intravenous  PONV Risk Score and Plan: 4 or greater and Midazolam, Dexamethasone and Ondansetron  Airway Management Planned: Oral ETT and Video Laryngoscope Planned  Additional Equipment:   Intra-op Plan:   Post-operative Plan: Extubation in OR  Informed Consent: I have reviewed the patients History and Physical, chart, labs and discussed the procedure including the risks, benefits and alternatives for the proposed anesthesia with the patient or authorized representative who has indicated his/her understanding and acceptance.   Patient has DNR.  Discussed DNR with patient and Suspend DNR.   Dental advisory given  Plan  Discussed with: CRNA  Anesthesia Plan Comments:        Anesthesia Quick Evaluation                                   Anesthesia Evaluation  Patient identified by MRN, date of birth, ID band Patient awake    Reviewed: Allergy & Precautions, NPO status , Patient's Chart, lab work & pertinent test results, reviewed documented beta blocker date and time   Airway Mallampati: II  TM Distance: >3 FB Neck ROM: Full    Dental  (+) Dental Advisory Given, Poor Dentition, Missing   Pulmonary neg pulmonary ROS, former smoker,  COVID-19 + on 05/10/2019   Pulmonary exam normal breath sounds clear to auscultation       Cardiovascular hypertension, Pt. on home beta blockers +CHF  + dysrhythmias (LBBB) Supra Ventricular Tachycardia  Rhythm:Regular Rate:Tachycardia  Echocardiogram this admission showed LV function of 40 to 45%   Neuro/Psych PSYCHIATRIC DISORDERS Depression MS negative neurological ROS     GI/Hepatic Neg liver ROS, GERD  Medicated,Gallstones    Endo/Other  diabetes, Type 1, Insulin Dependent  Renal/GU negative Renal ROS     Musculoskeletal negative musculoskeletal ROS (+)   Abdominal   Peds  Hematology  (+) Blood dyscrasia, anemia ,   Anesthesia Other Findings Day of surgery medications reviewed with the patient.  Reproductive/Obstetrics                            Anesthesia Physical Anesthesia Plan  ASA: IV  Anesthesia Plan: General   Post-op Pain Management:    Induction: Intravenous  PONV Risk Score and Plan: 4 or greater and Midazolam, Dexamethasone and Ondansetron  Airway Management Planned: Oral ETT and Video Laryngoscope Planned  Additional Equipment:   Intra-op Plan:   Post-operative Plan: Extubation in OR  Informed Consent: I have reviewed the patients History and Physical, chart, labs and discussed the procedure including the risks, benefits and alternatives for the proposed anesthesia with  the patient or authorized representative who has indicated his/her understanding and acceptance.   Patient has DNR.  Discussed DNR with patient and Suspend DNR.   Dental advisory given  Plan Discussed with: CRNA  Anesthesia Plan Comments:        Anesthesia Quick Evaluation  Anesthesia Physical Anesthesia Plan  ASA: III  Anesthesia Plan: General   Post-op Pain Management:    Induction:   PONV Risk Score and Plan: 4 or greater and Midazolam, Dexamethasone, Ondansetron and Treatment may vary due to age or medical condition  Airway Management Planned: Oral ETT  Additional Equipment:   Intra-op Plan:   Post-operative Plan: Extubation in OR  Informed Consent: I have reviewed the patients History and Physical, chart, labs and discussed the procedure including the risks, benefits and alternatives for the proposed anesthesia with the patient or authorized representative who has indicated his/her understanding and acceptance.  Patient has DNR.  Discussed DNR with patient and Suspend DNR.   Dental advisory given  Plan Discussed with: CRNA  Anesthesia Plan Comments: (See PAT note, Konrad Felix, PA-C)     Anesthesia Quick Evaluation                                  Anesthesia Evaluation  Patient identified by MRN, date of birth, ID band Patient awake    Reviewed: Allergy & Precautions, NPO status , Patient's Chart, lab work & pertinent test results, reviewed documented beta blocker date and time   Airway Mallampati: II  TM Distance: >3 FB Neck ROM: Full    Dental  (+) Dental Advisory Given, Poor Dentition, Missing   Pulmonary neg pulmonary ROS, former smoker,  COVID-19 + on 05/10/2019   Pulmonary exam normal breath sounds clear to auscultation       Cardiovascular hypertension, Pt. on home beta blockers +CHF  + dysrhythmias (LBBB) Supra Ventricular Tachycardia  Rhythm:Regular Rate:Tachycardia  Echocardiogram this admission showed LV  function of 40 to 45%   Neuro/Psych PSYCHIATRIC DISORDERS Depression MS negative neurological ROS     GI/Hepatic Neg liver ROS, GERD  Medicated,Gallstones    Endo/Other  diabetes, Type 1, Insulin Dependent  Renal/GU negative Renal ROS     Musculoskeletal negative musculoskeletal ROS (+)   Abdominal   Peds  Hematology  (+) Blood dyscrasia, anemia ,   Anesthesia Other Findings Day of surgery medications reviewed with the patient.  Reproductive/Obstetrics                            Anesthesia Physical Anesthesia Plan  ASA: IV  Anesthesia Plan: General   Post-op Pain Management:    Induction: Intravenous  PONV Risk Score and Plan: 4 or greater and Midazolam, Dexamethasone and Ondansetron  Airway Management Planned: Oral ETT and Video Laryngoscope Planned  Additional Equipment:   Intra-op Plan:   Post-operative Plan: Extubation in OR  Informed Consent: I have reviewed the patients History and Physical, chart, labs and discussed the procedure including the risks, benefits and alternatives for the proposed anesthesia with the patient or authorized representative who has indicated his/her understanding and acceptance.   Patient has DNR.  Discussed DNR with patient and Suspend DNR.   Dental advisory given  Plan Discussed with: CRNA  Anesthesia Plan Comments:        Anesthesia Quick Evaluation

## 2020-02-09 NOTE — Progress Notes (Signed)
Anesthesia Chart Review   Case: [id number] Date/Time: 02/12/20 0945   Procedure: LAPAROSCOPIC CHOLECYSTECTOMY (N/A )   Anesthesia type: General   Pre-op diagnosis: CHOLECYSTITIS   Location: [facility] ROOM 02 / [facility]   Surgeons: Clovis Riley, MD      DISCUSSION:59 y.o. former smoker (2.5 pack years) with h/o HTN, MS (nonambulatory at baseline), cardiomyopathy, LBBB, DM II insulin dependent, cholecystitis scheduled for above procedure 02/12/2020 with Dr. Romana Juniper.   Per cardiology preoperative risk assessment 11/10/2019, "Chart revisited as part of pre-operative protocol coverage. Per Dr. Martinique, patient is cleared to proceed with procedure as requested. She is noted to be on full dose aspirin but not for cardiac reasons. Therefore, OK to hold from our standpoint. I will route this note to Dr. Doug Sou nurse Malachy Mood to suggest to patient that she discuss the long term plan for her aspirin regimen with her PCP in case they have her on it for other reasons. The patient also no-showed for her follow-up visit and so should be offered rescheduling."  Anticipate pt can proceed with planned procedure barring acute status change.   VS: Ht 5\' 4"  (1.626 m)   Wt 113.4 kg   BMI 42.91 kg/m   PROVIDERS: Gildardo Cranker, DO is PCP   Martinique, Peter, MD is Cardiologist  LABS: Labs reviewed: Acceptable for surgery. (all labs ordered are listed, but only abnormal results are displayed)  Labs Reviewed - No data to display   IMAGES:   EKG: 07/20/2019 Rate 110 bpm  Sinus tachycardia Otherwise normal ECG No significant change since last tracing  CV: Cardiac Cath 10/29/2019  There is mild left ventricular systolic dysfunction.  The left ventricular ejection fraction is 45-50% by visual estimate.  There is no aortic valve stenosis.  Angiographically normal coronary arteries    Angiographically normal coronary arteries with a codominant system but the RCA is relatively diminutive.  Nonischemic  Cardiomyopathy with mildly reduced EF of roughly 45%, global hypokinesis.  Minimally elevated LVEDP  Myocardial Perfusion 09/17/2019 IMPRESSION: 1. Moderate size reversible defect is noted involving the mid lateral wall. Fixed defect is noted involving the apex and septum.  2. Lateral, inferior wall and apical hypokinesis with dyskinetic septum.  3. Left ventricular ejection fraction 47%  4. Non invasive risk stratification*: Intermediate  Echo 07/19/2019 IMPRESSIONS    1. Left ventricular ejection fraction, by visual estimation, is 40 to  45%. The left ventricle has mildly decreased function. There is no  increased left ventricular wall thickness.  2. Indeterminate diastolic filling due to E-A fusion.  3. The left ventricle demonstrates global hypokinesis.  4. The right ventricle has normal systolic function.The right ventricular  size is normal.  5. The mitral valve is normal in structure. Mild mitral valve  regurgitation.  6. The tricuspid valve was normal in structure.  7. Tricuspid valve regurgitation is mild-moderate.  8. Moderately elevated pulmonary artery systolic pressure.  9. Compared to 2016 and 2019 reports, LV systolic function has  deteriorated. Side by side comparison was not available. Past Medical History:  Diagnosis Date  . Cardiomyopathy ([place])    a.  Echo 04/29/12: Mild LVH, EF 20-25%, mild AI, moderate MR, moderate LAE, mild RAE, mild RVE, moderate TR, PASP 51, small pericardial effusion;   b. probably non-ischemic given multiple chemo-Tx agents used for MS and global LV dysfn on echo  . Chronic systolic heart failure ([place])   . COVID-19 05/10/2019   Per facility paperwork  . Decubitus ulcer  stage II sacral decubitus ulcer.   . Depression   . Glaucoma   . Hypertension   . MS (multiple sclerosis) ([place])    a. Dx'd late 20's. b. Tx with Novantrone, Tysabri, Copaxone previously.    Past Surgical History:  Procedure Laterality Date  .  ABLATION     uterine  . CESAREAN SECTION    . CHOLECYSTECTOMY N/A 07/20/2019   Procedure: DIAGNOSTIC LAPAROSCOPY;  Surgeon: [name], MD;  Location: [facility];  Service: General;  Laterality: N/A;  . IR EXCHANGE BILIARY DRAIN  09/07/2019  . IR PERC CHOLECYSTOSTOMY  07/21/2019  . LEFT HEART CATH AND CORONARY ANGIOGRAPHY N/A 10/29/2019   Procedure: LEFT HEART CATH AND CORONARY ANGIOGRAPHY;  Surgeon: Leonie Man, MD;  Location: [facility];  Service: Cardiovascular;  Laterality: N/A;    MEDICATIONS: No current facility-administered medications for this encounter.   Marland Kitchen acetaminophen (TYLENOL) 325 MG tablet  . albuterol (PROVENTIL) (2.5 MG/3ML) 0.083% nebulizer solution  . albuterol (VENTOLIN HFA) 108 (90 Base) MCG/ACT inhaler  . amantadine (SYMMETREL) 100 MG capsule  . ascorbic acid (VITAMIN C) 500 MG tablet  . aspirin 325 MG EC tablet  . Baclofen 5 MG TABS  . buPROPion (WELLBUTRIN SR) 150 MG 12 hr tablet  . Cranberry 400 MG TABS  . diphenhydrAMINE (BENADRYL) 25 mg capsule  . DULoxetine HCl 60 MG CSDR  . Ensure (ENSURE)  . famotidine (PEPCID) 20 MG tablet  . ferrous sulfate 325 (65 FE) MG tablet  . fluticasone (FLONASE) 50 MCG/ACT nasal spray  . gabapentin (NEURONTIN) 800 MG tablet  . ibuprofen (ADVIL) 400 MG tablet  . insulin aspart (NOVOLOG FLEXPEN) 100 UNIT/ML FlexPen  . insulin glargine (LANTUS) 100 UNIT/ML injection  . Lactobacillus (PROBIOTIC ACIDOPHILUS) CAPS  . latanoprost (XALATAN) 0.005 % ophthalmic solution  . lubiprostone (AMITIZA) 24 MCG capsule  . Melatonin 5 MG TABS  . metoprolol succinate (TOPROL-XL) 100 MG 24 hr tablet  . Multiple Vitamins-Minerals (MULTIVITAMIN ADULT PO)  . omeprazole (PRILOSEC) 20 MG capsule  . ondansetron (ZOFRAN) 4 MG tablet  . potassium chloride (KLOR-CON) 20 MEQ packet  . senna-docusate (SENOKOT S) 8.6-50 MG tablet  . simethicone (MYLICON) 407 MG chewable tablet  . sodium phosphate Pediatric (FLEET) 3.5-9.5 GM/59ML enema  .  spironolactone (ALDACTONE) 25 MG tablet  . tamsulosin (FLOMAX) 0.4 MG CAPS capsule  . traMADol (ULTRAM) 50 MG tablet     Konrad Felix, PA-C [facility] [phone]

## 2020-02-11 MED ORDER — VANCOMYCIN HCL 1500 MG/300ML IV SOLN
1500.0000 mg | INTRAVENOUS | Status: AC
  Administered 2020-02-12: 1500 mg via INTRAVENOUS
  Filled 2020-02-11: qty 300

## 2020-02-11 MED ORDER — BUPIVACAINE LIPOSOME 1.3 % IJ SUSP
20.0000 mL | Freq: Once | INTRAMUSCULAR | Status: DC
  Filled 2020-02-11: qty 20

## 2020-02-12 ENCOUNTER — Encounter (HOSPITAL_COMMUNITY): Payer: Self-pay | Admitting: Surgery

## 2020-02-12 ENCOUNTER — Other Ambulatory Visit: Payer: Self-pay

## 2020-02-12 ENCOUNTER — Encounter (HOSPITAL_COMMUNITY)
Admission: AD | Disposition: A | Discharge status: Skilled Nursing Facility | Payer: Self-pay | Source: Home / Self Care | Attending: Surgery

## 2020-02-12 ENCOUNTER — Ambulatory Visit (HOSPITAL_COMMUNITY): Payer: Medicare (Managed Care) | Admitting: Physician Assistant

## 2020-02-12 ENCOUNTER — Inpatient Hospital Stay (HOSPITAL_COMMUNITY)
Admission: AD | Admit: 2020-02-12 | Discharge: 2020-02-20 | DRG: 417 | Disposition: A | Discharge status: Skilled Nursing Facility | Payer: Medicare (Managed Care) | Attending: Surgery | Admitting: Surgery

## 2020-02-12 DIAGNOSIS — Z95828 Presence of other vascular implants and grafts: Secondary | ICD-10-CM

## 2020-02-12 DIAGNOSIS — J309 Allergic rhinitis, unspecified: Secondary | ICD-10-CM | POA: Diagnosis present

## 2020-02-12 DIAGNOSIS — Z6841 Body Mass Index (BMI) 40.0 and over, adult: Secondary | ICD-10-CM

## 2020-02-12 DIAGNOSIS — I5032 Chronic diastolic (congestive) heart failure: Secondary | ICD-10-CM | POA: Diagnosis present

## 2020-02-12 DIAGNOSIS — G8220 Paraplegia, unspecified: Secondary | ICD-10-CM | POA: Diagnosis present

## 2020-02-12 DIAGNOSIS — Z87891 Personal history of nicotine dependence: Secondary | ICD-10-CM

## 2020-02-12 DIAGNOSIS — T8140XA Infection following a procedure, unspecified, initial encounter: Secondary | ICD-10-CM | POA: Diagnosis not present

## 2020-02-12 DIAGNOSIS — I110 Hypertensive heart disease with heart failure: Secondary | ICD-10-CM | POA: Diagnosis present

## 2020-02-12 DIAGNOSIS — Z880 Allergy status to penicillin: Secondary | ICD-10-CM

## 2020-02-12 DIAGNOSIS — Z882 Allergy status to sulfonamides status: Secondary | ICD-10-CM

## 2020-02-12 DIAGNOSIS — G35 Multiple sclerosis: Secondary | ICD-10-CM | POA: Diagnosis present

## 2020-02-12 DIAGNOSIS — J9811 Atelectasis: Secondary | ICD-10-CM | POA: Diagnosis not present

## 2020-02-12 DIAGNOSIS — Z87440 Personal history of urinary (tract) infections: Secondary | ICD-10-CM

## 2020-02-12 DIAGNOSIS — N179 Acute kidney failure, unspecified: Secondary | ICD-10-CM | POA: Diagnosis present

## 2020-02-12 DIAGNOSIS — K9186 Retained cholelithiasis following cholecystectomy: Secondary | ICD-10-CM | POA: Diagnosis present

## 2020-02-12 DIAGNOSIS — K5903 Drug induced constipation: Secondary | ICD-10-CM | POA: Diagnosis present

## 2020-02-12 DIAGNOSIS — I5042 Chronic combined systolic (congestive) and diastolic (congestive) heart failure: Secondary | ICD-10-CM | POA: Diagnosis present

## 2020-02-12 DIAGNOSIS — A411 Sepsis due to other specified staphylococcus: Secondary | ICD-10-CM | POA: Diagnosis not present

## 2020-02-12 DIAGNOSIS — Z66 Do not resuscitate: Secondary | ICD-10-CM | POA: Diagnosis present

## 2020-02-12 DIAGNOSIS — Z79899 Other long term (current) drug therapy: Secondary | ICD-10-CM

## 2020-02-12 DIAGNOSIS — Z20822 Contact with and (suspected) exposure to covid-19: Secondary | ICD-10-CM | POA: Diagnosis present

## 2020-02-12 DIAGNOSIS — E559 Vitamin D deficiency, unspecified: Secondary | ICD-10-CM | POA: Diagnosis present

## 2020-02-12 DIAGNOSIS — R1310 Dysphagia, unspecified: Secondary | ICD-10-CM | POA: Diagnosis present

## 2020-02-12 DIAGNOSIS — K831 Obstruction of bile duct: Secondary | ICD-10-CM

## 2020-02-12 DIAGNOSIS — F329 Major depressive disorder, single episode, unspecified: Secondary | ICD-10-CM | POA: Diagnosis present

## 2020-02-12 DIAGNOSIS — E271 Primary adrenocortical insufficiency: Secondary | ICD-10-CM | POA: Diagnosis present

## 2020-02-12 DIAGNOSIS — Z8616 Personal history of COVID-19: Secondary | ICD-10-CM

## 2020-02-12 DIAGNOSIS — I69391 Dysphagia following cerebral infarction: Secondary | ICD-10-CM

## 2020-02-12 DIAGNOSIS — T8112XA Postprocedural septic shock, initial encounter: Secondary | ICD-10-CM | POA: Diagnosis not present

## 2020-02-12 DIAGNOSIS — K8689 Other specified diseases of pancreas: Secondary | ICD-10-CM | POA: Diagnosis present

## 2020-02-12 DIAGNOSIS — Z7401 Bed confinement status: Secondary | ICD-10-CM

## 2020-02-12 DIAGNOSIS — K5909 Other constipation: Secondary | ICD-10-CM | POA: Diagnosis present

## 2020-02-12 DIAGNOSIS — K8309 Other cholangitis: Secondary | ICD-10-CM

## 2020-02-12 DIAGNOSIS — K660 Peritoneal adhesions (postprocedural) (postinfection): Secondary | ICD-10-CM | POA: Diagnosis present

## 2020-02-12 DIAGNOSIS — Z794 Long term (current) use of insulin: Secondary | ICD-10-CM

## 2020-02-12 DIAGNOSIS — E669 Obesity, unspecified: Secondary | ICD-10-CM | POA: Diagnosis present

## 2020-02-12 DIAGNOSIS — Z820 Family history of epilepsy and other diseases of the nervous system: Secondary | ICD-10-CM

## 2020-02-12 DIAGNOSIS — Z8619 Personal history of other infectious and parasitic diseases: Secondary | ICD-10-CM

## 2020-02-12 DIAGNOSIS — E109 Type 1 diabetes mellitus without complications: Secondary | ICD-10-CM | POA: Diagnosis present

## 2020-02-12 DIAGNOSIS — D62 Acute posthemorrhagic anemia: Secondary | ICD-10-CM | POA: Diagnosis not present

## 2020-02-12 DIAGNOSIS — K219 Gastro-esophageal reflux disease without esophagitis: Secondary | ICD-10-CM | POA: Diagnosis present

## 2020-02-12 DIAGNOSIS — Y733 Surgical instruments, materials and gastroenterology and urology devices (including sutures) associated with adverse incidents: Secondary | ICD-10-CM | POA: Diagnosis present

## 2020-02-12 DIAGNOSIS — L309 Dermatitis, unspecified: Secondary | ICD-10-CM | POA: Diagnosis present

## 2020-02-12 DIAGNOSIS — Z7982 Long term (current) use of aspirin: Secondary | ICD-10-CM

## 2020-02-12 DIAGNOSIS — Y838 Other surgical procedures as the cause of abnormal reaction of the patient, or of later complication, without mention of misadventure at the time of the procedure: Secondary | ICD-10-CM | POA: Diagnosis present

## 2020-02-12 DIAGNOSIS — G4700 Insomnia, unspecified: Secondary | ICD-10-CM | POA: Diagnosis present

## 2020-02-12 DIAGNOSIS — R509 Fever, unspecified: Secondary | ICD-10-CM

## 2020-02-12 DIAGNOSIS — K819 Cholecystitis, unspecified: Secondary | ICD-10-CM | POA: Diagnosis present

## 2020-02-12 DIAGNOSIS — K8065 Calculus of gallbladder and bile duct with chronic cholecystitis with obstruction: Principal | ICD-10-CM | POA: Diagnosis present

## 2020-02-12 DIAGNOSIS — Z8249 Family history of ischemic heart disease and other diseases of the circulatory system: Secondary | ICD-10-CM

## 2020-02-12 DIAGNOSIS — J9601 Acute respiratory failure with hypoxia: Secondary | ICD-10-CM | POA: Diagnosis not present

## 2020-02-12 DIAGNOSIS — G894 Chronic pain syndrome: Secondary | ICD-10-CM | POA: Diagnosis present

## 2020-02-12 DIAGNOSIS — I9581 Postprocedural hypotension: Secondary | ICD-10-CM | POA: Diagnosis not present

## 2020-02-12 DIAGNOSIS — T8144XA Sepsis following a procedure, initial encounter: Secondary | ICD-10-CM | POA: Diagnosis not present

## 2020-02-12 DIAGNOSIS — E875 Hyperkalemia: Secondary | ICD-10-CM | POA: Diagnosis not present

## 2020-02-12 DIAGNOSIS — G35D Multiple sclerosis, unspecified: Secondary | ICD-10-CM | POA: Diagnosis present

## 2020-02-12 DIAGNOSIS — E1065 Type 1 diabetes mellitus with hyperglycemia: Secondary | ICD-10-CM | POA: Diagnosis not present

## 2020-02-12 DIAGNOSIS — E785 Hyperlipidemia, unspecified: Secondary | ICD-10-CM | POA: Diagnosis present

## 2020-02-12 DIAGNOSIS — K859 Acute pancreatitis without necrosis or infection, unspecified: Secondary | ICD-10-CM | POA: Diagnosis not present

## 2020-02-12 DIAGNOSIS — K8050 Calculus of bile duct without cholangitis or cholecystitis without obstruction: Secondary | ICD-10-CM

## 2020-02-12 HISTORY — DX: Pressure ulcer of unspecified site, unspecified stage: L89.90

## 2020-02-12 HISTORY — PX: CHOLECYSTECTOMY: SHX55

## 2020-02-12 LAB — COMPREHENSIVE METABOLIC PANEL
ALT: 28 U/L (ref 0–44)
AST: 24 U/L (ref 15–41)
Albumin: 3.4 g/dL — ABNORMAL LOW (ref 3.5–5.0)
Alkaline Phosphatase: 226 U/L — ABNORMAL HIGH (ref 38–126)
Anion gap: 8 (ref 5–15)
BUN: 9 mg/dL (ref 6–20)
CO2: 26 mmol/L (ref 22–32)
Calcium: 8.9 mg/dL (ref 8.9–10.3)
Chloride: 107 mmol/L (ref 98–111)
Creatinine, Ser: 0.94 mg/dL (ref 0.44–1.00)
GFR calc Af Amer: >60 mL/min (ref >60)
GFR calc non Af Amer: >60 mL/min (ref >60)
Glucose, Bld: 77 mg/dL (ref 70–99)
Potassium: 4 mmol/L (ref 3.5–5.1)
Sodium: 141 mmol/L (ref 135–145)
Total Bilirubin: 0.4 mg/dL (ref 0.3–1.2)
Total Protein: 6.6 g/dL (ref 6.5–8.1)

## 2020-02-12 LAB — CBC WITH DIFFERENTIAL/PLATELET
Abs Immature Granulocytes: 0.01 10*3/uL (ref 0.00–0.07)
Basophils Absolute: 0.1 10*3/uL (ref 0.0–0.1)
Basophils Relative: 2 %
Eosinophils Absolute: 0.6 10*3/uL — ABNORMAL HIGH (ref 0.0–0.5)
Eosinophils Relative: 9 %
HCT: 37.2 % (ref 36.0–46.0)
Hemoglobin: 12.4 g/dL (ref 12.0–15.0)
Immature Granulocytes: 0 %
Lymphocytes Relative: 25 %
Lymphs Abs: 1.5 10*3/uL (ref 0.7–4.0)
MCH: 28.8 pg (ref 26.0–34.0)
MCHC: 33.3 g/dL (ref 30.0–36.0)
MCV: 86.5 fL (ref 80.0–100.0)
Monocytes Absolute: 0.4 10*3/uL (ref 0.1–1.0)
Monocytes Relative: 7 %
Neutro Abs: 3.3 10*3/uL (ref 1.7–7.7)
Neutrophils Relative %: 57 %
Platelets: 313 10*3/uL (ref 150–400)
RBC: 4.3 MIL/uL (ref 3.87–5.11)
RDW: 14.6 % (ref 11.5–15.5)
WBC: 5.9 10*3/uL (ref 4.0–10.5)
nRBC: 0 % (ref 0.0–0.2)

## 2020-02-12 LAB — GLUCOSE, CAPILLARY
Glucose-Capillary: 155 mg/dL — ABNORMAL HIGH (ref 70–99)
Glucose-Capillary: 150 mg/dL — ABNORMAL HIGH (ref 70–99)
Glucose-Capillary: 169 mg/dL — ABNORMAL HIGH (ref 70–99)

## 2020-02-12 LAB — HEMOGLOBIN A1C
Hgb A1c MFr Bld: 5.2 % (ref 4.8–5.6)
Mean Plasma Glucose: 102.54 mg/dL

## 2020-02-12 LAB — SARS CORONAVIRUS 2 BY RT PCR (HOSPITAL ORDER, PERFORMED IN ~~LOC~~ HOSPITAL LAB): SARS Coronavirus 2: NEGATIVE

## 2020-02-12 SURGERY — LAPAROSCOPIC CHOLECYSTECTOMY
Anesthesia: General

## 2020-02-12 MED ORDER — DEXAMETHASONE SODIUM PHOSPHATE 10 MG/ML IJ SOLN
INTRAMUSCULAR | Status: AC
  Filled 2020-02-12: qty 1

## 2020-02-12 MED ORDER — MIDAZOLAM HCL 2 MG/2ML IJ SOLN
INTRAMUSCULAR | Status: AC
  Filled 2020-02-12: qty 2

## 2020-02-12 MED ORDER — MELATONIN 5 MG PO TABS
10.0000 mg | ORAL_TABLET | Freq: Every day | ORAL | Status: DC
  Administered 2020-02-12 – 2020-02-19 (×7): 10 mg via ORAL
  Filled 2020-02-12 (×8): qty 2

## 2020-02-12 MED ORDER — PHENYLEPHRINE 40 MCG/ML (10ML) SYRINGE FOR IV PUSH (FOR BLOOD PRESSURE SUPPORT)
PREFILLED_SYRINGE | INTRAVENOUS | Status: DC | PRN
  Administered 2020-02-12 (×2): 120 ug via INTRAVENOUS
  Administered 2020-02-12: 160 ug via INTRAVENOUS
  Administered 2020-02-12 (×2): 120 ug via INTRAVENOUS
  Administered 2020-02-12: 80 ug via INTRAVENOUS
  Administered 2020-02-12: 120 ug via INTRAVENOUS
  Administered 2020-02-12: 160 ug via INTRAVENOUS

## 2020-02-12 MED ORDER — MIDAZOLAM HCL 2 MG/2ML IJ SOLN
INTRAMUSCULAR | Status: DC | PRN
  Administered 2020-02-12: 2 mg via INTRAVENOUS

## 2020-02-12 MED ORDER — INSULIN ASPART 100 UNIT/ML FLEXPEN: 0.0000 [IU] | PEN_INJECTOR | Freq: Three times a day (TID) | SUBCUTANEOUS | Status: DC

## 2020-02-12 MED ORDER — SUGAMMADEX SODIUM 200 MG/2ML IV SOLN
INTRAVENOUS | Status: DC | PRN
  Administered 2020-02-12: 230 mg via INTRAVENOUS

## 2020-02-12 MED ORDER — METRONIDAZOLE IN NACL 5-0.79 MG/ML-% IV SOLN
500.0000 mg | Freq: Three times a day (TID) | INTRAVENOUS | Status: AC
  Administered 2020-02-12: 16:00:00 500 mg via INTRAVENOUS
  Filled 2020-02-12: qty 100

## 2020-02-12 MED ORDER — EPHEDRINE SULFATE-NACL 50-0.9 MG/10ML-% IV SOSY
PREFILLED_SYRINGE | INTRAVENOUS | Status: DC | PRN
  Administered 2020-02-12 (×3): 10 mg via INTRAVENOUS

## 2020-02-12 MED ORDER — PHENYLEPHRINE 40 MCG/ML (10ML) SYRINGE FOR IV PUSH (FOR BLOOD PRESSURE SUPPORT)
PREFILLED_SYRINGE | INTRAVENOUS | Status: AC
  Filled 2020-02-12: qty 10

## 2020-02-12 MED ORDER — BACLOFEN 5 MG PO TABS: 5.0000 mg | ORAL_TABLET | Freq: Four times a day (QID) | ORAL | Status: DC

## 2020-02-12 MED ORDER — IBUPROFEN 200 MG PO TABS
400.0000 mg | ORAL_TABLET | Freq: Four times a day (QID) | ORAL | Status: DC | PRN
  Administered 2020-02-14: 400 mg via ORAL
  Filled 2020-02-12: qty 1

## 2020-02-12 MED ORDER — PANTOPRAZOLE SODIUM 40 MG PO TBEC
40.0000 mg | DELAYED_RELEASE_TABLET | Freq: Every day | ORAL | Status: DC
  Administered 2020-02-12 – 2020-02-20 (×9): 40 mg via ORAL
  Filled 2020-02-12 (×9): qty 1

## 2020-02-12 MED ORDER — LUBIPROSTONE 24 MCG PO CAPS
24.0000 ug | ORAL_CAPSULE | Freq: Two times a day (BID) | ORAL | Status: DC
  Administered 2020-02-12 – 2020-02-20 (×14): 24 ug via ORAL
  Filled 2020-02-12 (×17): qty 1

## 2020-02-12 MED ORDER — FENTANYL CITRATE (PF) 100 MCG/2ML IJ SOLN
INTRAMUSCULAR | Status: AC
  Filled 2020-02-12: qty 2

## 2020-02-12 MED ORDER — INSULIN GLARGINE 100 UNIT/ML ~~LOC~~ SOLN
10.0000 [IU] | Freq: Every day | SUBCUTANEOUS | Status: DC
  Administered 2020-02-12 – 2020-02-14 (×3): 10 [IU] via SUBCUTANEOUS
  Filled 2020-02-12 (×3): qty 0.1

## 2020-02-12 MED ORDER — DIPHENHYDRAMINE HCL 25 MG PO CAPS
25.0000 mg | ORAL_CAPSULE | Freq: Three times a day (TID) | ORAL | Status: DC | PRN
  Administered 2020-02-19: 25 mg via ORAL
  Filled 2020-02-12: qty 1

## 2020-02-12 MED ORDER — TRAMADOL HCL 50 MG PO TABS
100.0000 mg | ORAL_TABLET | Freq: Four times a day (QID) | ORAL | Status: DC | PRN
  Administered 2020-02-12 – 2020-02-17 (×6): 100 mg via ORAL
  Filled 2020-02-12 (×6): qty 2

## 2020-02-12 MED ORDER — LACTATED RINGERS IV SOLN: INTRAVENOUS | Status: DC

## 2020-02-12 MED ORDER — CHLORHEXIDINE GLUCONATE 0.12 % MT SOLN
15.0000 mL | Freq: Once | OROMUCOSAL | Status: AC
  Administered 2020-02-12: 15 mL via OROMUCOSAL

## 2020-02-12 MED ORDER — DOCUSATE SODIUM 100 MG PO CAPS
100.0000 mg | ORAL_CAPSULE | Freq: Two times a day (BID) | ORAL | Status: DC
  Administered 2020-02-12 – 2020-02-20 (×15): 100 mg via ORAL
  Filled 2020-02-12 (×15): qty 1

## 2020-02-12 MED ORDER — ALBUTEROL SULFATE (2.5 MG/3ML) 0.083% IN NEBU
2.5000 mg | INHALATION_SOLUTION | Freq: Four times a day (QID) | RESPIRATORY_TRACT | Status: DC | PRN
  Filled 2020-02-12: qty 3

## 2020-02-12 MED ORDER — LIDOCAINE 2% (20 MG/ML) 5 ML SYRINGE
INTRAMUSCULAR | Status: AC
  Filled 2020-02-12: qty 5

## 2020-02-12 MED ORDER — HYDRALAZINE HCL 20 MG/ML IJ SOLN
10.0000 mg | INTRAMUSCULAR | Status: DC | PRN
  Administered 2020-02-18: 10 mg via INTRAVENOUS
  Filled 2020-02-12: qty 1

## 2020-02-12 MED ORDER — DULOXETINE HCL 60 MG PO CSDR: 60.0000 mg | DELAYED_RELEASE_CAPSULE | Freq: Every day | ORAL | Status: DC

## 2020-02-12 MED ORDER — DULOXETINE HCL 60 MG PO CPEP
60.0000 mg | ORAL_CAPSULE | Freq: Every day | ORAL | Status: DC
  Administered 2020-02-12 – 2020-02-20 (×9): 60 mg via ORAL
  Filled 2020-02-12: qty 2
  Filled 2020-02-12: qty 1
  Filled 2020-02-12: qty 2
  Filled 2020-02-12: qty 1
  Filled 2020-02-12: qty 2
  Filled 2020-02-12 (×4): qty 1

## 2020-02-12 MED ORDER — ONDANSETRON HCL 4 MG/2ML IJ SOLN
INTRAMUSCULAR | Status: AC
  Filled 2020-02-12: qty 2

## 2020-02-12 MED ORDER — FENTANYL CITRATE (PF) 100 MCG/2ML IJ SOLN
INTRAMUSCULAR | Status: DC | PRN
  Administered 2020-02-12: 25 ug via INTRAVENOUS
  Administered 2020-02-12: 50 ug via INTRAVENOUS
  Administered 2020-02-12: 25 ug via INTRAVENOUS

## 2020-02-12 MED ORDER — GABAPENTIN 400 MG PO CAPS
800.0000 mg | ORAL_CAPSULE | Freq: Three times a day (TID) | ORAL | Status: DC
  Administered 2020-02-12 – 2020-02-15 (×9): 800 mg via ORAL
  Filled 2020-02-12 (×10): qty 2

## 2020-02-12 MED ORDER — FAMOTIDINE 20 MG PO TABS
20.0000 mg | ORAL_TABLET | Freq: Two times a day (BID) | ORAL | Status: DC
  Administered 2020-02-12 – 2020-02-20 (×15): 20 mg via ORAL
  Filled 2020-02-12 (×15): qty 1

## 2020-02-12 MED ORDER — BUPIVACAINE-EPINEPHRINE 0.25% -1:200000 IJ SOLN
INTRAMUSCULAR | Status: DC | PRN
  Administered 2020-02-12: 30 mL

## 2020-02-12 MED ORDER — FLEET ENEMA 7-19 GM/118ML RE ENEM: 1.0000 | ENEMA | Freq: Once | RECTAL | Status: DC | PRN

## 2020-02-12 MED ORDER — PHENYLEPHRINE HCL-NACL 10-0.9 MG/250ML-% IV SOLN
INTRAVENOUS | Status: DC | PRN
  Administered 2020-02-12: 40 ug/min via INTRAVENOUS

## 2020-02-12 MED ORDER — BUPIVACAINE LIPOSOME 1.3 % IJ SUSP
INTRAMUSCULAR | Status: DC | PRN
  Administered 2020-02-12: 20 mL

## 2020-02-12 MED ORDER — METOPROLOL SUCCINATE ER 50 MG PO TB24
150.0000 mg | ORAL_TABLET | Freq: Every day | ORAL | Status: DC
  Administered 2020-02-12 – 2020-02-14 (×3): 150 mg via ORAL
  Filled 2020-02-12 (×4): qty 3

## 2020-02-12 MED ORDER — AMANTADINE HCL 100 MG PO CAPS
100.0000 mg | ORAL_CAPSULE | Freq: Every day | ORAL | Status: DC
  Administered 2020-02-12 – 2020-02-20 (×9): 100 mg via ORAL
  Filled 2020-02-12 (×9): qty 1

## 2020-02-12 MED ORDER — LACTATED RINGERS IR SOLN
Status: DC | PRN
  Administered 2020-02-12: 2000 mL

## 2020-02-12 MED ORDER — PROPOFOL 10 MG/ML IV BOLUS
INTRAVENOUS | Status: AC
  Filled 2020-02-12: qty 20

## 2020-02-12 MED ORDER — ACETAMINOPHEN 500 MG PO TABS
1000.0000 mg | ORAL_TABLET | ORAL | Status: AC
  Administered 2020-02-12: 1000 mg via ORAL
  Filled 2020-02-12: qty 2

## 2020-02-12 MED ORDER — INDOCYANINE GREEN 25 MG IV SOLR
2.5000 mg | Freq: Once | INTRAVENOUS | Status: DC
  Filled 2020-02-12: qty 10

## 2020-02-12 MED ORDER — SIMETHICONE 80 MG PO CHEW: 80.0000 mg | CHEWABLE_TABLET | Freq: Four times a day (QID) | ORAL | Status: DC | PRN

## 2020-02-12 MED ORDER — CHLORHEXIDINE GLUCONATE 4 % EX LIQD: 60.0000 mL | Freq: Once | CUTANEOUS | Status: DC

## 2020-02-12 MED ORDER — INSULIN ASPART 100 UNIT/ML ~~LOC~~ SOLN
0.0000 [IU] | Freq: Three times a day (TID) | SUBCUTANEOUS | Status: DC
  Administered 2020-02-12 – 2020-02-17 (×5): 2 [IU] via SUBCUTANEOUS
  Administered 2020-02-18: 3 [IU] via SUBCUTANEOUS

## 2020-02-12 MED ORDER — SPIRONOLACTONE 25 MG PO TABS
25.0000 mg | ORAL_TABLET | Freq: Every day | ORAL | Status: DC
  Administered 2020-02-12 – 2020-02-15 (×4): 25 mg via ORAL
  Filled 2020-02-12 (×4): qty 1

## 2020-02-12 MED ORDER — ENOXAPARIN SODIUM 40 MG/0.4ML ~~LOC~~ SOLN
40.0000 mg | SUBCUTANEOUS | Status: DC
  Administered 2020-02-13 – 2020-02-15 (×3): 40 mg via SUBCUTANEOUS
  Filled 2020-02-12 (×3): qty 0.4

## 2020-02-12 MED ORDER — ONDANSETRON HCL 4 MG PO TABS: 4.0000 mg | ORAL_TABLET | Freq: Four times a day (QID) | ORAL | Status: DC | PRN

## 2020-02-12 MED ORDER — ONDANSETRON HCL 4 MG/2ML IJ SOLN
INTRAMUSCULAR | Status: DC | PRN
  Administered 2020-02-12: 4 mg via INTRAVENOUS

## 2020-02-12 MED ORDER — BUPIVACAINE-EPINEPHRINE (PF) 0.25% -1:200000 IJ SOLN
INTRAMUSCULAR | Status: AC
  Filled 2020-02-12: qty 30

## 2020-02-12 MED ORDER — DEXAMETHASONE SODIUM PHOSPHATE 10 MG/ML IJ SOLN
INTRAMUSCULAR | Status: DC | PRN
  Administered 2020-02-12: 4 mg via INTRAVENOUS

## 2020-02-12 MED ORDER — PHENYLEPHRINE HCL (PRESSORS) 10 MG/ML IV SOLN
INTRAVENOUS | Status: AC
  Filled 2020-02-12: qty 1

## 2020-02-12 MED ORDER — FENTANYL CITRATE (PF) 100 MCG/2ML IJ SOLN
25.0000 ug | INTRAMUSCULAR | Status: DC | PRN
  Administered 2020-02-12 (×2): 25 ug via INTRAVENOUS

## 2020-02-12 MED ORDER — ACETAMINOPHEN 500 MG PO TABS
1000.0000 mg | ORAL_TABLET | Freq: Four times a day (QID) | ORAL | Status: DC
  Administered 2020-02-12 – 2020-02-18 (×21): 1000 mg via ORAL
  Filled 2020-02-12 (×22): qty 2

## 2020-02-12 MED ORDER — PROPOFOL 10 MG/ML IV BOLUS
INTRAVENOUS | Status: DC | PRN
  Administered 2020-02-12: 130 mg via INTRAVENOUS

## 2020-02-12 MED ORDER — BACLOFEN 10 MG PO TABS
5.0000 mg | ORAL_TABLET | Freq: Four times a day (QID) | ORAL | Status: DC
  Administered 2020-02-12 – 2020-02-20 (×27): 5 mg via ORAL
  Filled 2020-02-12 (×30): qty 1

## 2020-02-12 MED ORDER — FENTANYL CITRATE (PF) 100 MCG/2ML IJ SOLN
12.5000 ug | INTRAMUSCULAR | Status: DC | PRN
  Administered 2020-02-13: 10:00:00 12.5 ug via INTRAVENOUS
  Filled 2020-02-12: qty 2

## 2020-02-12 MED ORDER — ROCURONIUM BROMIDE 10 MG/ML (PF) SYRINGE
PREFILLED_SYRINGE | INTRAVENOUS | Status: DC | PRN
  Administered 2020-02-12: 60 mg via INTRAVENOUS

## 2020-02-12 MED ORDER — BUPROPION HCL ER (SR) 150 MG PO TB12
150.0000 mg | ORAL_TABLET | Freq: Every day | ORAL | Status: DC
  Administered 2020-02-12 – 2020-02-20 (×9): 150 mg via ORAL
  Filled 2020-02-12 (×9): qty 1

## 2020-02-12 MED ORDER — EPHEDRINE 5 MG/ML INJ
INTRAVENOUS | Status: AC
  Filled 2020-02-12: qty 10

## 2020-02-12 MED ORDER — CIPROFLOXACIN IN D5W 400 MG/200ML IV SOLN
400.0000 mg | Freq: Two times a day (BID) | INTRAVENOUS | Status: AC
  Administered 2020-02-12: 400 mg via INTRAVENOUS
  Filled 2020-02-12: qty 200

## 2020-02-12 MED ORDER — METOPROLOL TARTRATE 5 MG/5ML IV SOLN: 5.0000 mg | Freq: Four times a day (QID) | INTRAVENOUS | Status: DC | PRN

## 2020-02-12 MED ORDER — ALBUTEROL SULFATE HFA 108 (90 BASE) MCG/ACT IN AERS: 2.0000 | INHALATION_SPRAY | Freq: Four times a day (QID) | RESPIRATORY_TRACT | Status: DC | PRN

## 2020-02-12 MED ORDER — ROCURONIUM BROMIDE 10 MG/ML (PF) SYRINGE
PREFILLED_SYRINGE | INTRAVENOUS | Status: AC
  Filled 2020-02-12: qty 10

## 2020-02-12 MED ORDER — LIDOCAINE 2% (20 MG/ML) 5 ML SYRINGE
INTRAMUSCULAR | Status: DC | PRN
  Administered 2020-02-12: 100 mg via INTRAVENOUS

## 2020-02-12 MED ORDER — BISACODYL 10 MG RE SUPP: 10.0000 mg | Freq: Every day | RECTAL | Status: DC | PRN

## 2020-02-12 MED ORDER — ONDANSETRON HCL 4 MG/2ML IJ SOLN: 4.0000 mg | Freq: Once | INTRAMUSCULAR | Status: DC | PRN

## 2020-02-12 MED ORDER — ORAL CARE MOUTH RINSE: 15.0000 mL | Freq: Once | OROMUCOSAL | Status: AC

## 2020-02-12 MED ORDER — METOPROLOL TARTRATE 5 MG/5ML IV SOLN
INTRAVENOUS | Status: DC | PRN
Start: 2020-02-12 — End: 2020-02-12
  Administered 2020-02-12: 1 mg via INTRAVENOUS

## 2020-02-12 MED ORDER — IBUPROFEN 400 MG PO TABS: 400.0000 mg | ORAL_TABLET | Freq: Two times a day (BID) | ORAL | Status: DC | PRN

## 2020-02-12 SURGICAL SUPPLY — 47 items
ADH SKN CLS APL DERMABOND .7 (GAUZE/BANDAGES/DRESSINGS) ×1
APL PRP STRL LF DISP 70% ISPRP (MISCELLANEOUS) ×2
APPLIER CLIP ROT 10 11.4 M/L (STAPLE) ×3
APR CLP MED LRG 11.4X10 (STAPLE) ×1
BAG SPEC RTRVL LRG 6X4 10 (ENDOMECHANICALS) ×1
CABLE HIGH FREQUENCY MONO STRZ (ELECTRODE) ×3 IMPLANT
CHLORAPREP W/TINT 26 (MISCELLANEOUS) ×5 IMPLANT
CLIP APPLIE ROT 10 11.4 M/L (STAPLE) ×1 IMPLANT
COVER MAYO STAND STRL (DRAPES) IMPLANT
COVER SURGICAL LIGHT HANDLE (MISCELLANEOUS) ×3 IMPLANT
COVER WAND RF STERILE (DRAPES) IMPLANT
DECANTER SPIKE VIAL GLASS SM (MISCELLANEOUS) ×3 IMPLANT
DERMABOND ADVANCED (GAUZE/BANDAGES/DRESSINGS) ×2
DERMABOND ADVANCED .7 DNX12 (GAUZE/BANDAGES/DRESSINGS) ×1 IMPLANT
DRAIN CHANNEL 19F RND (DRAIN) ×2 IMPLANT
DRAPE C-ARM 42X120 X-RAY (DRAPES) IMPLANT
DRSG TEGADERM 4X4.75 (GAUZE/BANDAGES/DRESSINGS) ×2 IMPLANT
ELECT REM PT RETURN 15FT ADLT (MISCELLANEOUS) ×3 IMPLANT
ENDOLOOP SUT PDS II  0 18 (SUTURE) ×6
ENDOLOOP SUT PDS II 0 18 (SUTURE) IMPLANT
EVACUATOR SILICONE 100CC (DRAIN) ×2 IMPLANT
GAUZE SPONGE 2X2 8PLY STRL LF (GAUZE/BANDAGES/DRESSINGS) IMPLANT
GLOVE BIO SURGEON STRL SZ 6 (GLOVE) ×3 IMPLANT
GLOVE INDICATOR 6.5 STRL GRN (GLOVE) ×3 IMPLANT
GOWN STRL REUS W/TWL LRG LVL3 (GOWN DISPOSABLE) ×5 IMPLANT
GOWN STRL REUS W/TWL XL LVL3 (GOWN DISPOSABLE) ×6 IMPLANT
GRASPER SUT TROCAR 14GX15 (MISCELLANEOUS) ×3 IMPLANT
HEMOSTAT SNOW SURGICEL 2X4 (HEMOSTASIS) ×4 IMPLANT
KIT BASIN OR (CUSTOM PROCEDURE TRAY) ×3 IMPLANT
KIT TURNOVER KIT A (KITS) ×2 IMPLANT
NDL INSUFFLATION 14GA 120MM (NEEDLE) ×1 IMPLANT
NEEDLE INSUFFLATION 14GA 120MM (NEEDLE) ×3 IMPLANT
PENCIL SMOKE EVACUATOR (MISCELLANEOUS) IMPLANT
POUCH SPECIMEN RETRIEVAL 10MM (ENDOMECHANICALS) ×3 IMPLANT
SCISSORS LAP 5X35 DISP (ENDOMECHANICALS) ×3 IMPLANT
SET CHOLANGIOGRAPH MIX (MISCELLANEOUS) IMPLANT
SET IRRIG TUBING LAPAROSCOPIC (IRRIGATION / IRRIGATOR) ×3 IMPLANT
SET TUBE SMOKE EVAC HIGH FLOW (TUBING) ×3 IMPLANT
SLEEVE XCEL OPT CAN 5 100 (ENDOMECHANICALS) ×8 IMPLANT
SPONGE GAUZE 2X2 STER 10/PKG (GAUZE/BANDAGES/DRESSINGS) ×2
SUT ETHILON 2 0 PS N (SUTURE) ×2 IMPLANT
SUT MNCRL AB 4-0 PS2 18 (SUTURE) ×3 IMPLANT
TOWEL OR 17X26 10 PK STRL BLUE (TOWEL DISPOSABLE) ×3 IMPLANT
TOWEL OR NON WOVEN STRL DISP B (DISPOSABLE) ×2 IMPLANT
TRAY LAPAROSCOPIC (CUSTOM PROCEDURE TRAY) ×3 IMPLANT
TROCAR BLADELESS OPT 5 100 (ENDOMECHANICALS) ×3 IMPLANT
TROCAR XCEL 12X100 BLDLESS (ENDOMECHANICALS) ×3 IMPLANT

## 2020-02-12 NOTE — Anesthesia Procedure Notes (Signed)
Procedure Name: Intubation Date/Time: 02/12/2020 9:20 AM Performed by: Niel Hummer, CRNA Pre-anesthesia Checklist: Patient identified, Emergency Drugs available, Suction available and Patient being monitored Patient Re-evaluated:Patient Re-evaluated prior to induction Oxygen Delivery Method: Circle system utilized Preoxygenation: Pre-oxygenation with 100% oxygen Induction Type: IV induction Ventilation: Mask ventilation without difficulty Laryngoscope Size: Mac and 4 Grade View: Grade I Tube type: Oral Tube size: 7.0 mm Number of attempts: 1 Airway Equipment and Method: Stylet Placement Confirmation: ETT inserted through vocal cords under direct vision,  positive ETCO2 and breath sounds checked- equal and bilateral Secured at: 24 cm Tube secured with: Tape Dental Injury: Teeth and Oropharynx as per pre-operative assessment

## 2020-02-12 NOTE — Anesthesia Postprocedure Evaluation (Signed)
Anesthesia Post Note  Patient: Shannon Garrett  Procedure(s) Performed: LAPAROSCOPIC CHOLECYSTECTOMY (N/A )     Patient location during evaluation: PACU Anesthesia Type: General Level of consciousness: awake and alert Pain management: pain level controlled Vital Signs Assessment: post-procedure vital signs reviewed and stable Respiratory status: spontaneous breathing, nonlabored ventilation, respiratory function stable and patient connected to nasal cannula oxygen Cardiovascular status: blood pressure returned to baseline and stable Postop Assessment: no apparent nausea or vomiting Anesthetic complications: no   No complications documented.  Last Vitals:  Vitals:   02/12/20 1230 02/12/20 1245  BP: (!) 158/73 (!) 164/79  Pulse: 74 73  Resp: 13 13  Temp:    SpO2: 100% 96%    Last Pain:  Vitals:   02/12/20 1245  TempSrc:   PainSc: [place]

## 2020-02-12 NOTE — Transfer of Care (Signed)
Immediate Anesthesia Transfer of Care Note  Patient: Shannon Garrett  Procedure(s) Performed: LAPAROSCOPIC CHOLECYSTECTOMY (N/A )  Patient Location: PACU  Anesthesia Type:General  Level of Consciousness: awake, alert  and oriented  Airway & Oxygen Therapy: Patient Spontanous Breathing and Patient connected to face mask oxygen  Post-op Assessment: Report given to RN and Post -op Vital signs reviewed and stable  Post vital signs: Reviewed and stable  Last Vitals:  Vitals Value Taken Time  BP    Temp    Pulse 80 02/12/20 1220  Resp 17 02/12/20 1220  SpO2 100 % 02/12/20 1220  Vitals shown include unvalidated device data.  Last Pain:  Vitals:   02/12/20 0742  TempSrc:   PainSc: 0-No pain         Complications: No complications documented.

## 2020-02-12 NOTE — H&P (Signed)
Surgical Evaluation  HPI: 59 year old woman returns for planning of cholecystectomy. She was admitted to the hospital in January of this year with cholecystitis. The inflammation was so significant that surgery was aborted and she underwent percutaneous cholecystostomy tube. She is no longer having any pain from her gallbladder. She did have the tube replaced about a week ago and purulent aspirate was noted. She states that the tube is aggravating. She does suffer with chronic constipation due to pain meds that she takes for her multiple sclerosis but otherwise denies GI issues, denies fevers. She did recently see her cardiologist, and noted her ejection fraction has improved to 40-45% on most recent echo. She is scheduled to have a nuclear stress test in the near future.       Allergies  Allergen Reactions  . Sulfonamide Derivatives Hives and Shortness Of Breath  . Penicillins Hives and Itching    Has patient had a PCN reaction causing immediate rash, facial/tongue/throat swelling, SOB or lightheadedness with hypotension: unknown Has patient had a PCN reaction causing severe rash involving mucus membranes or skin necrosis: unknown Has patient had a PCN reaction that required hospitalization: unknown Has patient had a PCN reaction occurring within the last 10 years: unknown If all of the above answers are "NO", then may proceed with Cephalosporin use. Prior course of rocephin charted 05/2015        Past Medical History:  Diagnosis Date  . Cardiomyopathy ([place])    a.  Echo 04/29/12: Mild LVH, EF 20-25%, mild AI, moderate MR, moderate LAE, mild RAE, mild RVE, moderate TR, PASP 51, small pericardial effusion;   b. probably non-ischemic given multiple chemo-Tx agents used for MS and global LV dysfn on echo  . Chronic systolic heart failure ([place])   . COVID-19 05/10/2019   Per facility paperwork  . Depression   . Glaucoma   . Hypertension   . MS (multiple sclerosis) ([place])     a. Dx'd late 20's. b. Tx with Novantrone, Tysabri, Copaxone previously.         Past Surgical History:  Procedure Laterality Date  . ABLATION     uterine  . CESAREAN SECTION    . CHOLECYSTECTOMY N/A 07/20/2019   Procedure: DIAGNOSTIC LAPAROSCOPY;  Surgeon: [name], MD;  [facility];  Service: General;  Laterality: N/A;  . IR EXCHANGE BILIARY DRAIN  09/07/2019  . IR PERC CHOLECYSTOSTOMY  07/21/2019  . LEFT HEART CATH AND CORONARY ANGIOGRAPHY N/A 10/29/2019   Procedure: LEFT HEART CATH AND CORONARY ANGIOGRAPHY;  Surgeon: Leonie Man, MD;  Location: [facility];  Service: Cardiovascular;  Laterality: N/A;         Family History  Problem Relation Age of Onset  . Hypertension Mother   . Cancer Mother        breast   . Cancer Father        prostate  . Multiple sclerosis Sister   . Heart attack Neg Hx     Social History        Socioeconomic History  . Marital status: Single    Spouse name: Not on file  . Number of children: Not on file  . Years of education: Not on file  . Highest education level: Not on file  Occupational History  . Not on file  Tobacco Use  . Smoking status: Former Smoker    Packs/day: 0.50    Years: 5.00    Pack years: 2.50  . Smokeless tobacco: Never Used  Vaping Use  . Vaping Use: Never used  Substance and Sexual Activity  . Alcohol use: No  . Drug use: No  . Sexual activity: Not Currently  Other Topics Concern  . Not on file  Social History Narrative  . Not on file   Social Determinants of Health      Financial Resource Strain:   . Difficulty of Paying Living Expenses:   Food Insecurity:   . Worried About Charity fundraiser in the Last Year:   . Arboriculturist in the Last Year:   Transportation Needs:   . Film/video editor (Medical):   Marland Kitchen Lack of Transportation (Non-Medical):   Physical Activity:   . Days of Exercise per Week:   . Minutes of Exercise per Session:    Stress:   . Feeling of Stress :   Social Connections:   . Frequency of Communication with Friends and Family:   . Frequency of Social Gatherings with Friends and Family:   . Attends Religious Services:   . Active Member of Clubs or Organizations:   . Attends Archivist Meetings:   Marland Kitchen Marital Status:           Current Outpatient Medications on File Prior to Visit  Medication Sig Dispense Refill  . acetaminophen (TYLENOL) 325 MG tablet Take 650 mg by mouth every 8 (eight) hours as needed for mild pain, moderate pain or headache.     . albuterol (PROVENTIL) (2.5 MG/3ML) 0.083% nebulizer solution Take 3 mLs (2.5 mg total) by nebulization every 2 (two) hours as needed for wheezing. 75 mL 12  . albuterol (VENTOLIN HFA) 108 (90 Base) MCG/ACT inhaler Inhale 2 puffs into the lungs every 6 (six) hours as needed for wheezing or shortness of breath.     Marland Kitchen amantadine (SYMMETREL) 100 MG capsule Take 100 mg by mouth daily.     Marland Kitchen ascorbic acid (VITAMIN C) 500 MG tablet Take 500 mg by mouth daily.     Marland Kitchen aspirin 325 MG EC tablet Take 325 mg by mouth daily.    . Baclofen 5 MG TABS Take 5 mg by mouth 4 (four) times daily. Hold if lethargic or confused     . buPROPion (WELLBUTRIN XL) 150 MG 24 hr tablet Take 150 mg by mouth daily after breakfast.    . Cranberry 400 MG TABS Take 400 mg by mouth daily.     . DULoxetine HCl 60 MG CSDR Take 60 mg by mouth daily.     . Ensure (ENSURE) Take 237 mLs by mouth 2 (two) times daily between meals.    . famotidine (PEPCID) 20 MG tablet Take 1 tablet (20 mg total) by mouth 2 (two) times daily.    . ferrous sulfate 325 (65 FE) MG tablet Take 325 mg by mouth 2 (two) times daily with a meal.     . fluticasone (FLONASE) 50 MCG/ACT nasal spray Place 2 sprays into both nostrils every evening.    . gabapentin (NEURONTIN) 800 MG tablet Take 800 mg by mouth 3 (three) times daily.    . insulin aspart (NOVOLOG FLEXPEN) 100 UNIT/ML FlexPen  0-15 Units, Subcutaneous, 3 times daily with meals CBG < 70: implement hypoglycemia protocol-call MD CBG 70 - 120: 0 units CBG 121 - 150: 2 units CBG 151 - 200: 3 units CBG 201 - 250: 5 units CBG 251 - 300: 8 units CBG 301 - 350: 11 units CBG 351 - 400: 15 units CBG > 400: (  Patient taking differently: Inject 0-15 Units into the skin 3 (three) times daily with meals. 0-15 Units, Subcutaneous, 3 times daily with meals CBG < 70: implement hypoglycemia protocol-call MD CBG 70 - 120: 0 units CBG 121 - 150: 2 units CBG 151 - 200: 3 units CBG 201 - 250: 5 units CBG 251 - 300: 8 units CBG 301 - 350: 11 units CBG 351 - 400: 15 units CBG > 400:) 15 mL 0  . insulin glargine (LANTUS) 100 UNIT/ML injection Inject 0.1 mLs (10 Units total) into the skin daily. (Patient taking differently: Inject 10 Units into the skin at bedtime. ) 10 mL 11  . Lactobacillus (PROBIOTIC ACIDOPHILUS) CAPS Take 1 capsule by mouth daily.     Marland Kitchen latanoprost (XALATAN) 0.005 % ophthalmic solution Place 1 drop into both eyes at bedtime.    Marland Kitchen lubiprostone (AMITIZA) 24 MCG capsule Take 24 mcg by mouth 2 (two) times daily with a meal.    . Melatonin 5 MG TABS Take 10 mg by mouth at bedtime.     . metoprolol succinate (TOPROL-XL) 100 MG 24 hr tablet Take 1 tablet (100 mg total) by mouth daily. Take with or immediately following a meal. (Patient taking differently: Take 150 mg by mouth daily. Take with or immediately following a meal.) 90 tablet 3  . Multiple Vitamins-Minerals (MULTIVITAMIN ADULT PO) Take 1 tablet by mouth daily.    Marland Kitchen omeprazole (PRILOSEC) 20 MG capsule Take 20 mg by mouth daily.    . ondansetron (ZOFRAN) 4 MG tablet Take 4 mg by mouth every 6 (six) hours as needed for nausea or vomiting.    . potassium chloride (KLOR-CON) 20 MEQ packet Take 20 mEq by mouth daily.     Marland Kitchen senna-docusate (SENOKOT S) 8.6-50 MG tablet Take 2 tablets by mouth 2 (two) times daily.     . simethicone (MYLICON) 962 MG  chewable tablet Chew 125 mg by mouth every 6 (six) hours as needed for flatulence.    . sodium phosphate Pediatric (FLEET) 3.5-9.5 GM/59ML enema Place 1 enema rectally once as needed for mild constipation, moderate constipation or severe constipation.    Marland Kitchen spironolactone (ALDACTONE) 25 MG tablet Take 25 mg by mouth daily. For CHF    . tamsulosin (FLOMAX) 0.4 MG CAPS capsule Take 0.4 mg by mouth daily. For incontinence    . torsemide (DEMADEX) 20 MG tablet Take 20 mg by mouth daily.     . traMADol (ULTRAM) 50 MG tablet Take 100 mg by mouth every 6 (six) hours as needed (Pain).     No current facility-administered medications on file prior to visit.    Review of Systems: a complete, 10pt review of systems was completed with pertinent positives and negatives as documented in the HPI  Physical Exam: Vitals Weight: 270 lb Height: 65in Body Surface Area: 2.25 m Body Mass Index: 44.93 kg/m  Temp.: 97.56F  Pulse: 85 (Regular)  P.OX: 97% (Room air) BP: 108/72(Sitting, Left Arm, Standard)       Physical Exam  Alert and cooperative Unlabored respirations Obese female, on a stretcher. Abdomen is soft and nontender. Drain in right flank with mostly serous output, some purulent tinge.   CBC Latest Ref Rng & Units 10/26/2019 07/23/2019 07/22/2019  WBC 3.4 - 10.8 x10E3/uL 8.2 11.2(H) 15.6(H)  Hemoglobin 11.1 - 15.9 g/dL 12.0 9.7(L) 10.4(L)  Hematocrit 34.0 - 46.6 % 36.3 31.3(L) 33.3(L)  Platelets 150 - 450 x10E3/uL 354 434(H) 493(H)    CMP Latest Ref Rng &  Units 10/26/2019 07/23/2019 07/21/2019  Glucose 65 - 99 mg/dL 126(H) 81 150(H)  BUN 6 - 24 mg/dL 21 9 11   Creatinine 0.57 - 1.00 mg/dL 1.05(H) 0.83 0.90  Sodium 134 - 144 mmol/L 144 146(H) 143  Potassium 3.5 - 5.2 mmol/L 3.9 4.0 4.1  Chloride 96 - 106 mmol/L 104 110 109  CO2 20 - 29 mmol/L 24 26 24   Calcium 8.7 - 10.2 mg/dL 9.3 9.0 8.7(L)  Total Protein 6.5 - 8.1 g/dL - - 6.7  Total Bilirubin 0.3 -  1.2 mg/dL - - 0.5  Alkaline Phos 38 - 126 U/L - - 352(H)  AST 15 - 41 U/L - - 16  ALT 0 - 44 U/L - - 26    Recent Labs       Lab Results  Component Value Date   INR 1.2 07/21/2019   INR 1.0 05/12/2019   INR 1.1 05/10/2019      Imaging: Imaging Results (Last 48 hours)  No results found.     A/P: CHOLECYSTITIS (K81.9) Story: Status post percutaneous cholecystostomy tube in January. Tube replaced last week, with purulent aspirate noted. I recommend proceeding with laparoscopic cholecystectomy with possible cholangiogram. Discussed risks of surgery including bleeding, pain, scarring, intraabdominal injury specifically to the common bile duct and sequelae, conversion to open surgery, failure to resolve symptoms, blood clots/ pulmonary embolus, heart attack, pneumonia, stroke, death. It is possible that the scarring and inflammation in the right upper quadrant will preclude surgery again, or require a fenestrated cholecystectomy and surgical drain placement. We discussed that and that it entails increased risk of bile leak which would require ERCP and stent to treat. Questions welcomed and answered to patient's satisfaction. She has had cardiac clearance prior to surgery, per Melina Copa PA-C, ok to hold ASA. Cardiac cath 10/29/19 normal. NICM EF 45%        Patient Active Problem List   Diagnosis Date Noted  . Abnormal nuclear stress test   . Preoperative cardiovascular examination   . Acute calculous cholecystitis 07/19/2019  . Insulin dependent diabetes mellitus type IA ([place]) 07/19/2019  . History of Clostridioides difficile colitis 07/19/2019  . Multiple drug resistant organism (MDRO) Klebsiella pneumonia UTI culture positive 07/19/2019  . Enteritis due to Clostridium difficile 06/24/2019  . COVID-19 virus infection 06/24/2019  . Palliative care by specialist   . DNR (do not resuscitate)   . Sepsis ([place]) 05/10/2019  . History of COVID-19 Nov2020 05/10/2019  .  Dysesthesia 08/26/2018  . Dyslipidemia 06/13/2017  . Elevated alkaline phosphatase level 06/13/2017  . Acute frontal sinusitis 05/24/2017  . Influenza 04/19/2017  . Glaucoma 04/06/2017  . Allergic rhinitis due to allergen 03/07/2017  . Vaginal candidiasis 03/01/2017  . UTI (urinary tract infection) 01/02/2017  . High risk medication use 01/25/2016  . Neuropathy 11/13/2015  . GERD (gastroesophageal reflux disease) 11/13/2015  . Dysphagia as late effect of stroke 11/08/2015  . Adrenal insufficiency (Addison's disease) ([place]) 08/12/2015  . Vitamin D deficiency 06/18/2015  . Hypokalemia 06/03/2015  . Chronic anemia 05/30/2015  . Chronic pain syndrome 03/03/2015  . Essential hypertension   . Dilated cardiomyopathy ([place])   . Chronic constipation 11/26/2014  . Depression 11/26/2014  . Adult failure to thrive 07/31/2013  . Insomnia 06/03/2013  . Neurogenic bladder 06/03/2013  . Paraplegia ([place]) 09/05/2012  . Chronic diastolic CHF (congestive heart failure) ([place]) 05/22/2012  . Obesity 11/30/2008  . MS (multiple sclerosis) ([place]) 11/30/2008  . Hypertensive heart disease with CHF (congestive heart failure) ([place])  11/30/2008       Romana Juniper, [facility], [place]

## 2020-02-12 NOTE — Op Note (Signed)
Operative Note  Shannon Garrett 59 y.o. female [id number]  02/12/2020  Surgeon: Clovis Riley MD FACS  Assistant: Alphonsa Overall MD   Procedure performed: Laparoscopic Cholecystectomy  Preop diagnosis: Ongoing cholecystitis, status post percutaneous cholecystostomy tube in January following aborted attempt at laparoscopic cholecystectomy Post-op diagnosis/intraop findings: same, ongoing severe and fibrosing inflammation of the gallbladder wall and surrounding tissues  Specimens: gallbladder  Retained items: 19 French round Blake drain in the gallbladder fossa  EBL: 725 cc  Complications: none  Description of procedure: After obtaining informed consent the patient was brought to the operating room. Antibiotics were administered. SCD's were applied. General endotracheal anesthesia was initiated and a formal time-out was performed. The abdomen was prepped and draped in the usual sterile fashion and the abdomen was entered using visiport technique in the left upper quadrant after instilling the site with local. Insufflation to 42mHg was obtained without issue and gross inspection revealed no evidence of injury from our entry or other intraabdominal abnormalities.  The patient was placed in Trendelenburg and rotated slightly to the left.  Bilateral laparoscopic assisted taps blocks were performed using Exparel mixed with quarter percent Marcaine.  Three 556mtrocars were introduced in the supraclavicular, right midclavicular and right anterior axillary lines under direct visualization and following infiltration with local. An 1124mrocar was placed in the epigastrium.   There were dense omental adhesions surrounding the drain entry site, the drain is noted to enter directly through the abdominal wall into the gallbladder and the tip of the drain comes out through the tip of the gallbladder fundus.  The gallbladder and lateral edge of the liver are also densely adherent to the anterior abdominal wall.   This was all dissected free using combination of blunt and cautery dissection until the omentum and adjacent colon, which was not involved in the adhesions fortunately, could be swept down and the gallbladder and liver were freed from the abdominal wall sufficiently to be retracted.  The drain was removed and discarded.  The gallbladder was retracted cephalad and the infundibulum was retracted laterally.  There was fibrosing inflammatory change all along the gallbladder extending down into the hepatocystic triangle and adjacent porta.  A combination of hook electrocautery and blunt dissection was utilized to clear the peritoneum from the neck and cystic duct, this was done very carefully and transparent layers and was very tedious given the degree of inflammatory change present.  We are able to isolate a small cystic artery which was divided after placing clips on either side, an additional anterior small branch was also addressed with clips.    Due to inflammatory change, it seemed that the porta was tethered along the length of the cystic duct.  We were able to circumferentially dissect the gallbladder neck safely but cannot progress safely down along with cystic duct without risking injury to the common bile duct.  At this point, we transitioned to a dome down approach and dissected the gallbladder from the liver bed until we met with the area that was previously dissected out in the hepatocystic triangle.  The gallbladder was entered during this process filling numerous large and small stones.  We were able to dissect the gallbladder off until it narrowed down slightly at the level of the gallbladder neck.  There were no other structures entering the gallbladder besides this.  A PDS Endoloop was placed across the gallbladder neck distally and the gallbladder was transected and placed in an Endo Catch bag.  We then explored the  divided proximal gallbladder neck and were able to evacuate several more stones from  the proximal gallbladder and cystic duct, and we were able to see where the lumen narrowed down to the cystic duct about 1.5 centimeters from where we had divided the gallbladder neck.  The cystic duct and remaining neck were irrigated and once we were certain that all retained stones had been evacuated, 2 PDS Endoloops were placed across the cystic duct/gallbladder neck, effectively occluding it.  All spilled stones were retrieved, hemostasis along the liver bed ensured with cautery and the right upper quadrant was copiously irrigated; the effluent was clear.  The right upper quadrant was then again inspected, there was no bile leak from the liver bed or from the ligated cystic duct.  Surgicel snow was also placed.  A 19 French round Blake drain exiting the right lateral port site was placed in the gallbladder fossa.  This is secured the skin with a 2-0 nylon and placed to bulb suction.  The gallbladder within the Endo Catch bag was removed to the epigastric trocar site.  The fascia here was closed with a 0 vicryl under direct visualization using a PMI device. The abdomen was desufflated and all trocars removed. The skin incisions were closed with subcuticular monocryl and Dermabond.  A sterile dressing is placed over the drain site.  The patient was awakened, extubated and transported to the recovery room in stable condition.    All counts were correct at the completion of the case.

## 2020-02-13 ENCOUNTER — Encounter (HOSPITAL_COMMUNITY): Payer: Self-pay | Admitting: Surgery

## 2020-02-13 LAB — CBC
HCT: 32.5 % — ABNORMAL LOW (ref 36.0–46.0)
Hemoglobin: 10.6 g/dL — ABNORMAL LOW (ref 12.0–15.0)
MCH: 28.5 pg (ref 26.0–34.0)
MCHC: 32.6 g/dL (ref 30.0–36.0)
MCV: 87.4 fL (ref 80.0–100.0)
Platelets: 313 10*3/uL (ref 150–400)
RBC: 3.72 MIL/uL — ABNORMAL LOW (ref 3.87–5.11)
RDW: 14.5 % (ref 11.5–15.5)
WBC: 11.9 10*3/uL — ABNORMAL HIGH (ref 4.0–10.5)
nRBC: 0 % (ref 0.0–0.2)

## 2020-02-13 LAB — COMPREHENSIVE METABOLIC PANEL
ALT: 54 U/L — ABNORMAL HIGH (ref 0–44)
AST: 45 U/L — ABNORMAL HIGH (ref 15–41)
Albumin: 3 g/dL — ABNORMAL LOW (ref 3.5–5.0)
Alkaline Phosphatase: 195 U/L — ABNORMAL HIGH (ref 38–126)
Anion gap: 7 (ref 5–15)
BUN: 12 mg/dL (ref 6–20)
CO2: 24 mmol/L (ref 22–32)
Calcium: 8.2 mg/dL — ABNORMAL LOW (ref 8.9–10.3)
Chloride: 104 mmol/L (ref 98–111)
Creatinine, Ser: 0.96 mg/dL (ref 0.44–1.00)
GFR calc Af Amer: >60 mL/min (ref >60)
GFR calc non Af Amer: >60 mL/min (ref >60)
Glucose, Bld: 122 mg/dL — ABNORMAL HIGH (ref 70–99)
Potassium: 3.8 mmol/L (ref 3.5–5.1)
Sodium: 135 mmol/L (ref 135–145)
Total Bilirubin: 0.6 mg/dL (ref 0.3–1.2)
Total Protein: 6 g/dL — ABNORMAL LOW (ref 6.5–8.1)

## 2020-02-13 LAB — GLUCOSE, CAPILLARY
Glucose-Capillary: 80 mg/dL (ref 70–99)
Glucose-Capillary: 95 mg/dL (ref 70–99)
Glucose-Capillary: 98 mg/dL (ref 70–99)
Glucose-Capillary: 108 mg/dL — ABNORMAL HIGH (ref 70–99)

## 2020-02-13 NOTE — Plan of Care (Signed)
  Problem: Education: Goal: Knowledge of General Education information will improve Description: Including pain rating scale, medication(s)/side effects and non-pharmacologic comfort measures Outcome: Progressing   Problem: Clinical Measurements: Goal: Respiratory complications will improve Outcome: Progressing Goal: Cardiovascular complication will be avoided Outcome: Progressing   Problem: Nutrition: Goal: Adequate nutrition will be maintained Outcome: Progressing   Problem: Elimination: Goal: Will not experience complications related to bowel motility Outcome: Progressing Goal: Will not experience complications related to urinary retention Outcome: Progressing   Problem: Pain Managment: Goal: General experience of comfort will improve Outcome: Progressing   Problem: Safety: Goal: Ability to remain free from injury will improve Outcome: Progressing

## 2020-02-13 NOTE — Progress Notes (Signed)
1 Day Post-Op   Subjective/Chief Complaint: Reports moderate pain this morning   Objective: Vital signs in last 24 hours: Temp:  [97.4 F (36.3 C)-99.8 F (37.7 C)] 99.8 F (37.7 C) (08/14 0515) Pulse Rate:  [72-98] 90 (08/14 0633) Resp:  [12-19] 16 (08/14 0515) BP: (99-175)/(48-92) 113/56 (08/14 0211) SpO2:  [96 %-100 %] 100 % (08/14 0515) Last BM Date: 02/12/20  Intake/Output from previous day: 08/13 0701 - 08/14 0700 In: 1590.1 [P.O.:240; I.V.:800; IV Piggyback:550.1] Out: 635 [Urine:450; Drains:110; Blood:75] Intake/Output this shift: No intake/output data recorded.  Exam: Awake, follows commands Abdomen soft, moderate RUQ tenderness and guarding  Lab Results:  Recent Labs    02/12/20 0800 02/13/20 0443  WBC 5.9 11.9*  HGB 12.4 10.6*  HCT 37.2 32.5*  PLT 313 313   BMET Recent Labs    02/12/20 0800 02/13/20 0443  NA 141 135  K 4.0 3.8  CL 107 104  CO2 26 24  GLUCOSE 77 122*  BUN 9 12  CREATININE 0.94 0.96  CALCIUM 8.9 8.2*   PT/INR No results for input(s): LABPROT, INR in the last 72 hours. ABG No results for input(s): PHART, HCO3 in the last 72 hours.  Invalid input(s): PCO2, PO2  Studies/Results: No results found.  Anti-infectives: Anti-infectives (From admission, onward)   Start     Dose/Rate Route Frequency Ordered Stop   02/12/20 1500  metroNIDAZOLE (FLAGYL) IVPB 500 mg        500 mg 100 mL/hr over 60 Minutes Intravenous Every 8 hours 02/12/20 1433 02/12/20 1701   02/12/20 1500  ciprofloxacin (CIPRO) IVPB 400 mg        400 mg 200 mL/hr over 60 Minutes Intravenous Every 12 hours 02/12/20 1433 02/12/20 1655   02/12/20 0600  vancomycin (VANCOREADY) IVPB 1500 mg/300 mL        1,500 mg 150 mL/hr over 120 Minutes Intravenous On call to O.R. 02/11/20 1552 02/12/20 1035      Assessment/Plan: s/p Procedure(s): LAPAROSCOPIC CHOLECYSTECTOMY (N/A)  Given pain and tenderness, will keep today and repeat her labs in the morning.  Hgb down,  LFT's minimally up  LOS: 0 days    Coralie Keens 02/13/2020

## 2020-02-14 LAB — COMPREHENSIVE METABOLIC PANEL
ALT: 42 U/L (ref 0–44)
AST: 23 U/L (ref 15–41)
Albumin: 2.7 g/dL — ABNORMAL LOW (ref 3.5–5.0)
Alkaline Phosphatase: 189 U/L — ABNORMAL HIGH (ref 38–126)
Anion gap: 7 (ref 5–15)
BUN: 13 mg/dL (ref 6–20)
CO2: 24 mmol/L (ref 22–32)
Calcium: 8.3 mg/dL — ABNORMAL LOW (ref 8.9–10.3)
Chloride: 110 mmol/L (ref 98–111)
Creatinine, Ser: 0.89 mg/dL (ref 0.44–1.00)
GFR calc Af Amer: >60 mL/min (ref >60)
GFR calc non Af Amer: >60 mL/min (ref >60)
Glucose, Bld: 68 mg/dL — ABNORMAL LOW (ref 70–99)
Potassium: 3.7 mmol/L (ref 3.5–5.1)
Sodium: 141 mmol/L (ref 135–145)
Total Bilirubin: 0.7 mg/dL (ref 0.3–1.2)
Total Protein: 5.6 g/dL — ABNORMAL LOW (ref 6.5–8.1)

## 2020-02-14 LAB — GLUCOSE, CAPILLARY
Glucose-Capillary: 141 mg/dL — ABNORMAL HIGH (ref 70–99)
Glucose-Capillary: 147 mg/dL — ABNORMAL HIGH (ref 70–99)
Glucose-Capillary: 324 mg/dL — ABNORMAL HIGH (ref 70–99)
Glucose-Capillary: 63 mg/dL — ABNORMAL LOW (ref 70–99)
Glucose-Capillary: 66 mg/dL — ABNORMAL LOW (ref 70–99)
Glucose-Capillary: 83 mg/dL (ref 70–99)
Glucose-Capillary: 90 mg/dL (ref 70–99)

## 2020-02-14 LAB — CBC
HCT: 29.1 % — ABNORMAL LOW (ref 36.0–46.0)
Hemoglobin: 9.3 g/dL — ABNORMAL LOW (ref 12.0–15.0)
MCH: 28.4 pg (ref 26.0–34.0)
MCHC: 32 g/dL (ref 30.0–36.0)
MCV: 88.7 fL (ref 80.0–100.0)
Platelets: 233 10*3/uL (ref 150–400)
RBC: 3.28 MIL/uL — ABNORMAL LOW (ref 3.87–5.11)
RDW: 14.6 % (ref 11.5–15.5)
WBC: 8.7 10*3/uL (ref 4.0–10.5)
nRBC: 0 % (ref 0.0–0.2)

## 2020-02-14 NOTE — Progress Notes (Signed)
OT Cancellation Note  Patient Details Name: Ilea Hilton MRN: [id number] DOB: [birth year]   Cancelled Treatment:    Reason Eval/Treat Not Completed: Other (comment) Ms. Keliah Harned is a 59 year old woman s/p laparoscopic choleycystectomy with past medical history significant for but not limited to MS. OT order received to evaluate patient. Patient supine in bed when therapist entered the room. Patient reports living at [facility]. Patient requires lift for transfer to chair and is total care in bed except for being able to feed herself with set up using her right hand. Patient reports receiving therapy to maintain strength in her right arm and for positioning using trapeze bar. Patient appears to be at her baseline - total care for ADLs and mobility thus OT will sign off. Recommend return to facility and resume therapy to maintain her current ability to use right upper extremity for feeding and positioning.   Azim Gillingham L Treshon Stannard 02/14/2020, 2:41 PM

## 2020-02-14 NOTE — Progress Notes (Signed)
Patient new CBG is 324. Continue to monitor.

## 2020-02-14 NOTE — Discharge Summary (Signed)
Physician Discharge Summary  Patient ID: Shannon Garrett MRN: [id number] DOB/AGE: [birth year] 59 y.o.  Admit date: 02/12/2020 Discharge date: 02/14/2020  Admission Diagnoses:  Discharge Diagnoses:  Active Problems:   Cholecystitis   Discharged Condition: good  Hospital Course: uneventful post op recovery.  Discharged home POD#2  Consults: None  Significant Diagnostic Studies:   Treatments: surgery: lap chole  Discharge Exam: Blood pressure 122/64, pulse 93, temperature 98.6 F (37 C), temperature source Oral, resp. rate 14, height 5\' 4"  (1.626 m), weight 113.4 kg, SpO2 100 %. Awake and alert CV RRR Lungs clear Abdomen soft, drain serosang/brownish, incisions clean  Disposition: Discharge disposition: 03-Skilled Nursing Facility        Allergies as of 02/14/2020      Reactions   Sulfonamide Derivatives Hives, Shortness Of Breath   Penicillins Hives, Itching   Has patient had a PCN reaction causing immediate rash, facial/tongue/throat swelling, SOB or lightheadedness with hypotension: unknown Has patient had a PCN reaction causing severe rash involving mucus membranes or skin necrosis: unknown Has patient had a PCN reaction that required hospitalization: unknown Has patient had a PCN reaction occurring within the last 10 years: unknown If all of the above answers are "NO", then may proceed with Cephalosporin use. Prior course of rocephin charted 05/2015      Medication List    TAKE these medications   acetaminophen 325 MG tablet Commonly known as: TYLENOL Take 650 mg by mouth every 8 (eight) hours as needed for mild pain, moderate pain or headache.   albuterol 108 (90 Base) MCG/ACT inhaler Commonly known as: VENTOLIN HFA Inhale 2 puffs into the lungs every 6 (six) hours as needed for wheezing or shortness of breath.   amantadine 100 MG capsule Commonly known as: SYMMETREL Take 100 mg by mouth daily.   ascorbic acid 500 MG tablet Commonly known as: VITAMIN  C Take 500 mg by mouth daily.   aspirin 325 MG EC tablet Take 325 mg by mouth daily.   Baclofen 5 MG Tabs Take 5 mg by mouth 4 (four) times daily. Hold if lethargic or confused   buPROPion 150 MG 12 hr tablet Commonly known as: WELLBUTRIN SR Take 150 mg by mouth daily.   Cranberry 400 MG Tabs Take 400 mg by mouth daily.   diphenhydrAMINE 25 mg capsule Commonly known as: BENADRYL Take 25 mg by mouth every 8 (eight) hours as needed for itching.   DULoxetine HCl 60 MG Csdr Take 60 mg by mouth daily.   Ensure Take 237 mLs by mouth 2 (two) times daily between meals.   famotidine 20 MG tablet Commonly known as: PEPCID Take 1 tablet (20 mg total) by mouth 2 (two) times daily.   ferrous sulfate 325 (65 FE) MG tablet Take 325 mg by mouth 2 (two) times daily with a meal.   fluticasone 50 MCG/ACT nasal spray Commonly known as: FLONASE Place 2 sprays into both nostrils every evening.   gabapentin 800 MG tablet Commonly known as: NEURONTIN Take 800 mg by mouth 3 (three) times daily.   ibuprofen 400 MG tablet Commonly known as: ADVIL Take 400 mg by mouth every 12 (twelve) hours as needed for moderate pain.   insulin glargine 100 UNIT/ML injection Commonly known as: LANTUS Inject 0.1 mLs (10 Units total) into the skin daily. What changed: when to take this   latanoprost 0.005 % ophthalmic solution Commonly known as: XALATAN Place 1 drop into both eyes at bedtime.   lubiprostone 24 MCG capsule Commonly known  as: AMITIZA Take 24 mcg by mouth 2 (two) times daily with a meal.   melatonin 5 MG Tabs Take 10 mg by mouth at bedtime.   metoprolol succinate 100 MG 24 hr tablet Commonly known as: TOPROL-XL Take 150 mg by mouth daily. Take with or immediately following a meal.   MULTIVITAMIN ADULT PO Take 1 tablet by mouth daily.   NovoLOG FlexPen 100 UNIT/ML FlexPen Generic drug: insulin aspart 0-15 Units, Subcutaneous, 3 times daily with meals CBG < 70: implement  hypoglycemia protocol-call MD CBG 70 - 120: 0 units CBG 121 - 150: 2 units CBG 151 - 200: 3 units CBG 201 - 250: 5 units CBG 251 - 300: 8 units CBG 301 - 350: 11 units CBG 351 - 400: 15 units CBG > 400: What changed:   how much to take  how to take this  when to take this   omeprazole 20 MG capsule Commonly known as: PRILOSEC Take 20 mg by mouth daily.   ondansetron 4 MG tablet Commonly known as: ZOFRAN Take 4 mg by mouth every 6 (six) hours as needed for nausea or vomiting.   potassium chloride 20 MEQ packet Commonly known as: KLOR-CON Take 20 mEq by mouth daily.   Probiotic Acidophilus Caps Take 1 capsule by mouth daily.   Senokot S 8.6-50 MG tablet Generic drug: senna-docusate Take 2 tablets by mouth 2 (two) times daily.   simethicone 125 MG chewable tablet Commonly known as: MYLICON Chew 481 mg by mouth every 6 (six) hours as needed for flatulence.   sodium phosphate Pediatric 3.5-9.5 GM/59ML enema Place 1 enema rectally once as needed for mild constipation, moderate constipation or severe constipation.   spironolactone 25 MG tablet Commonly known as: ALDACTONE Take 25 mg by mouth daily. For CHF   tamsulosin 0.4 MG Caps capsule Commonly known as: FLOMAX Take 0.4 mg by mouth daily. For incontinence   traMADol 50 MG tablet Commonly known as: ULTRAM Take 100 mg by mouth every 6 (six) hours as needed (Pain).       Follow-up Information    Clovis Riley, MD In 3 weeks.   Specialty: General Surgery Contact information: [place] [phone]        [facility], [place] In 1 week.   Specialty: General Surgery Why: Nurse visit for drain removal Contact information: [address] [place] [phone]              Signed: Coralie Keens 02/14/2020, 8:17 AM

## 2020-02-14 NOTE — Progress Notes (Signed)
Patient ID: Shannon Garrett, female   DOB: 1960-07-23, 59 y.o.   MRN: [id number]   Doing better today Pain controlled Tolerating diet Abdomen soft, drain serosang  Plan Discharge to facility

## 2020-02-14 NOTE — Progress Notes (Signed)
Asked to give patient breathing tx, however, HR 137 and BBS are clear.  Spoke to RN and temp is currently 102.7.  Will remain available if situation changes.

## 2020-02-14 NOTE — Progress Notes (Signed)
CBG is 66. Patient given chocolate milkshake. Will recheck CBG.

## 2020-02-14 NOTE — Progress Notes (Signed)
Patient CBG is 63. Patient given orange juice. Will recheck CBG.

## 2020-02-14 NOTE — Social Work (Signed)
TOC CSW spoke with [facility].  They asked for OT and PT to be ordered in order for pt to return.  No, COVID test is needed.  CSW will continue to follow for dc needs.  Shannon Garrett, MSW, LCSW-A                  [facility] Transitions of CareClinical Social Worker [email or web address] [phone]

## 2020-02-14 NOTE — Progress Notes (Signed)
After juice, patient CBG is 83. She is not symptomatic. Patient has breakfast ordered.

## 2020-02-14 NOTE — Progress Notes (Signed)
PT Note  Patient Details Name: Shannon Garrett MRN: [id number] DOB: Dec 29, 1960   Cancelled Treatment:     PT order received but evaluation deferred.  Pt admitted from long-term care SNF placement and has been total care with use of mechanical lift equipment for significant length of time based on RN report and notes from multiple prior admissions.  Unfortunately, pt with no skilled PT needs at this time.  Recommend pt return to prior living arrangement at long-term SNF.  PT will sign off at this time.   Fidelia Cathers 02/14/2020, 2:02 PM

## 2020-02-15 ENCOUNTER — Inpatient Hospital Stay (HOSPITAL_COMMUNITY): Payer: Medicare (Managed Care) | Admitting: Anesthesiology

## 2020-02-15 ENCOUNTER — Encounter (HOSPITAL_COMMUNITY): Payer: Self-pay | Admitting: Surgery

## 2020-02-15 ENCOUNTER — Encounter (HOSPITAL_COMMUNITY)
Admission: AD | Disposition: A | Discharge status: Skilled Nursing Facility | Payer: Self-pay | Source: Home / Self Care | Attending: Surgery

## 2020-02-15 ENCOUNTER — Inpatient Hospital Stay (HOSPITAL_COMMUNITY): Payer: Medicare (Managed Care)

## 2020-02-15 ENCOUNTER — Ambulatory Visit (HOSPITAL_COMMUNITY): Payer: Medicare (Managed Care)

## 2020-02-15 DIAGNOSIS — K8309 Other cholangitis: Secondary | ICD-10-CM | POA: Diagnosis not present

## 2020-02-15 DIAGNOSIS — R945 Abnormal results of liver function studies: Secondary | ICD-10-CM | POA: Diagnosis not present

## 2020-02-15 DIAGNOSIS — K819 Cholecystitis, unspecified: Secondary | ICD-10-CM | POA: Diagnosis not present

## 2020-02-15 DIAGNOSIS — A419 Sepsis, unspecified organism: Secondary | ICD-10-CM

## 2020-02-15 DIAGNOSIS — B957 Other staphylococcus as the cause of diseases classified elsewhere: Secondary | ICD-10-CM | POA: Diagnosis not present

## 2020-02-15 DIAGNOSIS — K8065 Calculus of gallbladder and bile duct with chronic cholecystitis with obstruction: Secondary | ICD-10-CM | POA: Diagnosis present

## 2020-02-15 DIAGNOSIS — Y733 Surgical instruments, materials and gastroenterology and urology devices (including sutures) associated with adverse incidents: Secondary | ICD-10-CM | POA: Diagnosis present

## 2020-02-15 DIAGNOSIS — R5081 Fever presenting with conditions classified elsewhere: Secondary | ICD-10-CM | POA: Diagnosis not present

## 2020-02-15 DIAGNOSIS — T8112XA Postprocedural septic shock, initial encounter: Secondary | ICD-10-CM | POA: Diagnosis not present

## 2020-02-15 DIAGNOSIS — Z6841 Body Mass Index (BMI) 40.0 and over, adult: Secondary | ICD-10-CM | POA: Diagnosis not present

## 2020-02-15 DIAGNOSIS — Y838 Other surgical procedures as the cause of abnormal reaction of the patient, or of later complication, without mention of misadventure at the time of the procedure: Secondary | ICD-10-CM | POA: Diagnosis present

## 2020-02-15 DIAGNOSIS — T8144XA Sepsis following a procedure, initial encounter: Secondary | ICD-10-CM | POA: Diagnosis not present

## 2020-02-15 DIAGNOSIS — R7881 Bacteremia: Secondary | ICD-10-CM | POA: Diagnosis not present

## 2020-02-15 DIAGNOSIS — K9186 Retained cholelithiasis following cholecystectomy: Secondary | ICD-10-CM | POA: Diagnosis present

## 2020-02-15 DIAGNOSIS — Z20822 Contact with and (suspected) exposure to covid-19: Secondary | ICD-10-CM | POA: Diagnosis present

## 2020-02-15 DIAGNOSIS — K8030 Calculus of bile duct with cholangitis, unspecified, without obstruction: Secondary | ICD-10-CM

## 2020-02-15 DIAGNOSIS — G8220 Paraplegia, unspecified: Secondary | ICD-10-CM | POA: Diagnosis present

## 2020-02-15 DIAGNOSIS — E1065 Type 1 diabetes mellitus with hyperglycemia: Secondary | ICD-10-CM | POA: Diagnosis not present

## 2020-02-15 DIAGNOSIS — E875 Hyperkalemia: Secondary | ICD-10-CM | POA: Diagnosis not present

## 2020-02-15 DIAGNOSIS — Z9049 Acquired absence of other specified parts of digestive tract: Secondary | ICD-10-CM

## 2020-02-15 DIAGNOSIS — K660 Peritoneal adhesions (postprocedural) (postinfection): Secondary | ICD-10-CM | POA: Diagnosis present

## 2020-02-15 DIAGNOSIS — A411 Sepsis due to other specified staphylococcus: Secondary | ICD-10-CM | POA: Diagnosis not present

## 2020-02-15 DIAGNOSIS — T8140XA Infection following a procedure, unspecified, initial encounter: Secondary | ICD-10-CM | POA: Diagnosis not present

## 2020-02-15 DIAGNOSIS — K831 Obstruction of bile duct: Secondary | ICD-10-CM

## 2020-02-15 DIAGNOSIS — F329 Major depressive disorder, single episode, unspecified: Secondary | ICD-10-CM | POA: Diagnosis present

## 2020-02-15 DIAGNOSIS — D62 Acute posthemorrhagic anemia: Secondary | ICD-10-CM | POA: Diagnosis not present

## 2020-02-15 DIAGNOSIS — K859 Acute pancreatitis without necrosis or infection, unspecified: Secondary | ICD-10-CM | POA: Diagnosis not present

## 2020-02-15 DIAGNOSIS — N179 Acute kidney failure, unspecified: Secondary | ICD-10-CM | POA: Diagnosis present

## 2020-02-15 DIAGNOSIS — G35 Multiple sclerosis: Secondary | ICD-10-CM | POA: Diagnosis present

## 2020-02-15 DIAGNOSIS — I5042 Chronic combined systolic (congestive) and diastolic (congestive) heart failure: Secondary | ICD-10-CM | POA: Diagnosis present

## 2020-02-15 DIAGNOSIS — J9811 Atelectasis: Secondary | ICD-10-CM | POA: Diagnosis not present

## 2020-02-15 DIAGNOSIS — J9601 Acute respiratory failure with hypoxia: Secondary | ICD-10-CM | POA: Diagnosis not present

## 2020-02-15 DIAGNOSIS — E785 Hyperlipidemia, unspecified: Secondary | ICD-10-CM | POA: Diagnosis present

## 2020-02-15 DIAGNOSIS — Z9889 Other specified postprocedural states: Secondary | ICD-10-CM | POA: Diagnosis not present

## 2020-02-15 DIAGNOSIS — E271 Primary adrenocortical insufficiency: Secondary | ICD-10-CM | POA: Diagnosis present

## 2020-02-15 DIAGNOSIS — Z66 Do not resuscitate: Secondary | ICD-10-CM | POA: Diagnosis present

## 2020-02-15 HISTORY — PX: REMOVAL OF STONES: SHX5545

## 2020-02-15 HISTORY — PX: BILIARY DILATION: SHX6850

## 2020-02-15 HISTORY — PX: SPHINCTEROTOMY: SHX5544

## 2020-02-15 HISTORY — PX: ERCP: SHX5425

## 2020-02-15 LAB — HEPATIC FUNCTION PANEL
ALT: 118 U/L — ABNORMAL HIGH (ref 0–44)
AST: 95 U/L — ABNORMAL HIGH (ref 15–41)
Albumin: 2.8 g/dL — ABNORMAL LOW (ref 3.5–5.0)
Alkaline Phosphatase: 532 U/L — ABNORMAL HIGH (ref 38–126)
Bilirubin, Direct: 2.3 mg/dL — ABNORMAL HIGH (ref 0.0–0.2)
Indirect Bilirubin: 0.6 mg/dL (ref 0.3–0.9)
Total Bilirubin: 2.9 mg/dL — ABNORMAL HIGH (ref 0.3–1.2)
Total Protein: 5.9 g/dL — ABNORMAL LOW (ref 6.5–8.1)

## 2020-02-15 LAB — CBC
HCT: 28.6 % — ABNORMAL LOW (ref 36.0–46.0)
HCT: 27.8 % — ABNORMAL LOW (ref 36.0–46.0)
Hemoglobin: 9.6 g/dL — ABNORMAL LOW (ref 12.0–15.0)
Hemoglobin: 9.2 g/dL — ABNORMAL LOW (ref 12.0–15.0)
MCH: 28.9 pg (ref 26.0–34.0)
MCH: 28.7 pg (ref 26.0–34.0)
MCHC: 33.6 g/dL (ref 30.0–36.0)
MCHC: 33.1 g/dL (ref 30.0–36.0)
MCV: 86.1 fL (ref 80.0–100.0)
MCV: 86.6 fL (ref 80.0–100.0)
Platelets: 238 10*3/uL (ref 150–400)
Platelets: 257 10*3/uL (ref 150–400)
RBC: 3.32 MIL/uL — ABNORMAL LOW (ref 3.87–5.11)
RBC: 3.21 MIL/uL — ABNORMAL LOW (ref 3.87–5.11)
RDW: 14.5 % (ref 11.5–15.5)
RDW: 14.7 % (ref 11.5–15.5)
WBC: 17.2 10*3/uL — ABNORMAL HIGH (ref 4.0–10.5)
WBC: 22.7 10*3/uL — ABNORMAL HIGH (ref 4.0–10.5)
nRBC: 0 % (ref 0.0–0.2)
nRBC: 0 % (ref 0.0–0.2)

## 2020-02-15 LAB — TYPE AND SCREEN
ABO/RH(D): A POS
Antibody Screen: NEGATIVE

## 2020-02-15 LAB — TROPONIN I (HIGH SENSITIVITY): Troponin I (High Sensitivity): 27 ng/L — ABNORMAL HIGH (ref <18)

## 2020-02-15 LAB — MRSA PCR SCREENING: MRSA by PCR: POSITIVE — AB

## 2020-02-15 LAB — COMPREHENSIVE METABOLIC PANEL
ALT: 86 U/L — ABNORMAL HIGH (ref 0–44)
AST: 46 U/L — ABNORMAL HIGH (ref 15–41)
Albumin: 2.6 g/dL — ABNORMAL LOW (ref 3.5–5.0)
Alkaline Phosphatase: 446 U/L — ABNORMAL HIGH (ref 38–126)
Anion gap: 12 (ref 5–15)
BUN: 19 mg/dL (ref 6–20)
CO2: 23 mmol/L (ref 22–32)
Calcium: 8.1 mg/dL — ABNORMAL LOW (ref 8.9–10.3)
Chloride: 110 mmol/L (ref 98–111)
Creatinine, Ser: 1.44 mg/dL — ABNORMAL HIGH (ref 0.44–1.00)
GFR calc Af Amer: 47 mL/min — ABNORMAL LOW (ref >60)
GFR calc non Af Amer: 40 mL/min — ABNORMAL LOW (ref >60)
Glucose, Bld: 93 mg/dL (ref 70–99)
Potassium: 3.6 mmol/L (ref 3.5–5.1)
Sodium: 145 mmol/L (ref 135–145)
Total Bilirubin: 3.4 mg/dL — ABNORMAL HIGH (ref 0.3–1.2)
Total Protein: 5.7 g/dL — ABNORMAL LOW (ref 6.5–8.1)

## 2020-02-15 LAB — SURGICAL PATHOLOGY

## 2020-02-15 LAB — LACTIC ACID, PLASMA: Lactic Acid, Venous: 0.9 mmol/L (ref 0.5–1.9)

## 2020-02-15 LAB — BASIC METABOLIC PANEL
Anion gap: 10 (ref 5–15)
BUN: 15 mg/dL (ref 6–20)
CO2: 24 mmol/L (ref 22–32)
Calcium: 8.3 mg/dL — ABNORMAL LOW (ref 8.9–10.3)
Chloride: 106 mmol/L (ref 98–111)
Creatinine, Ser: 1.17 mg/dL — ABNORMAL HIGH (ref 0.44–1.00)
GFR calc Af Amer: 60 mL/min — ABNORMAL LOW (ref >60)
GFR calc non Af Amer: 52 mL/min — ABNORMAL LOW (ref >60)
Glucose, Bld: 178 mg/dL — ABNORMAL HIGH (ref 70–99)
Potassium: 3.7 mmol/L (ref 3.5–5.1)
Sodium: 140 mmol/L (ref 135–145)

## 2020-02-15 LAB — GLUCOSE, CAPILLARY
Glucose-Capillary: 139 mg/dL — ABNORMAL HIGH (ref 70–99)
Glucose-Capillary: 83 mg/dL (ref 70–99)
Glucose-Capillary: 90 mg/dL (ref 70–99)

## 2020-02-15 LAB — PROTIME-INR
INR: 1.1 (ref 0.8–1.2)
Prothrombin Time: 14 seconds (ref 11.4–15.2)

## 2020-02-15 LAB — LIPASE, BLOOD: Lipase: 1808 U/L — ABNORMAL HIGH (ref 11–51)

## 2020-02-15 SURGERY — ERCP, WITH INTERVENTION IF INDICATED
Anesthesia: General

## 2020-02-15 MED ORDER — PIPERACILLIN-TAZOBACTAM 3.375 G IVPB
3.3750 g | Freq: Three times a day (TID) | INTRAVENOUS | Status: DC
  Administered 2020-02-15: 11:00:00 3.375 g via INTRAVENOUS
  Filled 2020-02-15: qty 50

## 2020-02-15 MED ORDER — LACTATED RINGERS IV BOLUS: 1000.0000 mL | Freq: Once | INTRAVENOUS | Status: DC

## 2020-02-15 MED ORDER — INDOMETHACIN 50 MG RE SUPP
RECTAL | Status: DC | PRN
  Administered 2020-02-15: 100 mg via RECTAL

## 2020-02-15 MED ORDER — INDOMETHACIN 50 MG RE SUPP
100.0000 mg | Freq: Once | RECTAL | Status: DC
  Filled 2020-02-15: qty 2

## 2020-02-15 MED ORDER — SUGAMMADEX SODIUM 200 MG/2ML IV SOLN
INTRAVENOUS | Status: DC | PRN
  Administered 2020-02-15: 453.2 mg via INTRAVENOUS

## 2020-02-15 MED ORDER — SODIUM CHLORIDE 0.9 % IV BOLUS
500.0000 mL | Freq: Once | INTRAVENOUS | Status: AC
  Administered 2020-02-15: 01:00:00 500 mL via INTRAVENOUS

## 2020-02-15 MED ORDER — PROPOFOL 10 MG/ML IV BOLUS
INTRAVENOUS | Status: AC
  Filled 2020-02-15: qty 20

## 2020-02-15 MED ORDER — PIPERACILLIN-TAZOBACTAM 3.375 G IVPB: 3.3750 g | Freq: Four times a day (QID) | INTRAVENOUS | Status: DC

## 2020-02-15 MED ORDER — DEXAMETHASONE SODIUM PHOSPHATE 10 MG/ML IJ SOLN
INTRAMUSCULAR | Status: DC | PRN
  Administered 2020-02-15: 4 mg via INTRAVENOUS

## 2020-02-15 MED ORDER — LACTATED RINGERS IV SOLN: INTRAVENOUS | Status: DC | PRN

## 2020-02-15 MED ORDER — MIDAZOLAM HCL 2 MG/2ML IJ SOLN
INTRAMUSCULAR | Status: AC
  Filled 2020-02-15: qty 2

## 2020-02-15 MED ORDER — SODIUM CHLORIDE 0.9 % IV SOLN: 250.0000 mL | INTRAVENOUS | Status: DC

## 2020-02-15 MED ORDER — LIDOCAINE 2% (20 MG/ML) 5 ML SYRINGE
INTRAMUSCULAR | Status: DC | PRN
  Administered 2020-02-15: 60 mg via INTRAVENOUS

## 2020-02-15 MED ORDER — LIP MEDEX EX OINT
TOPICAL_OINTMENT | CUTANEOUS | Status: AC
  Filled 2020-02-15: qty 7

## 2020-02-15 MED ORDER — ETOMIDATE 2 MG/ML IV SOLN
INTRAVENOUS | Status: DC | PRN
  Administered 2020-02-15: 14 mg via INTRAVENOUS

## 2020-02-15 MED ORDER — SODIUM CHLORIDE 0.9 % IV SOLN
500.0000 mg | Freq: Three times a day (TID) | INTRAVENOUS | Status: DC
  Filled 2020-02-15 (×2): qty 0.5

## 2020-02-15 MED ORDER — SODIUM CHLORIDE 0.9 % IV BOLUS
1000.0000 mL | Freq: Once | INTRAVENOUS | Status: AC
  Administered 2020-02-15: 1000 mL via INTRAVENOUS

## 2020-02-15 MED ORDER — MIDAZOLAM HCL 5 MG/5ML IJ SOLN
INTRAMUSCULAR | Status: DC | PRN
  Administered 2020-02-15: 1 mg via INTRAVENOUS

## 2020-02-15 MED ORDER — MIDAZOLAM HCL 2 MG/2ML IJ SOLN
INTRAMUSCULAR | Status: AC
  Administered 2020-02-15: 0.5 mg
  Filled 2020-02-15: qty 2

## 2020-02-15 MED ORDER — CHLORHEXIDINE GLUCONATE CLOTH 2 % EX PADS
6.0000 | MEDICATED_PAD | Freq: Every day | CUTANEOUS | Status: DC
  Administered 2020-02-15: 19:00:00 6 via TOPICAL

## 2020-02-15 MED ORDER — GLUCAGON HCL RDNA (DIAGNOSTIC) 1 MG IJ SOLR
INTRAMUSCULAR | Status: DC | PRN
  Administered 2020-02-15 (×3): .25 mg via INTRAVENOUS

## 2020-02-15 MED ORDER — SODIUM CHLORIDE 0.9 % IV BOLUS
500.0000 mL | Freq: Once | INTRAVENOUS | Status: AC
  Administered 2020-02-15: 500 mL via INTRAVENOUS

## 2020-02-15 MED ORDER — FENTANYL CITRATE (PF) 100 MCG/2ML IJ SOLN
INTRAMUSCULAR | Status: AC
  Filled 2020-02-15: qty 2

## 2020-02-15 MED ORDER — ONDANSETRON HCL 4 MG/2ML IJ SOLN
INTRAMUSCULAR | Status: DC | PRN
  Administered 2020-02-15: 4 mg via INTRAVENOUS

## 2020-02-15 MED ORDER — INDOMETHACIN 50 MG RE SUPP
RECTAL | Status: AC
  Filled 2020-02-15: qty 2

## 2020-02-15 MED ORDER — SODIUM CHLORIDE 0.9 % IV SOLN
1.0000 g | Freq: Three times a day (TID) | INTRAVENOUS | Status: DC
  Administered 2020-02-15 – 2020-02-19 (×13): 1 g via INTRAVENOUS
  Filled 2020-02-15 (×18): qty 1

## 2020-02-15 MED ORDER — PHENYLEPHRINE HCL-NACL 10-0.9 MG/250ML-% IV SOLN
25.0000 ug/min | INTRAVENOUS | Status: DC
  Administered 2020-02-15: 25 ug/min via INTRAVENOUS
  Filled 2020-02-15 (×2): qty 250

## 2020-02-15 MED ORDER — ORAL CARE MOUTH RINSE
15.0000 mL | Freq: Two times a day (BID) | OROMUCOSAL | Status: DC
  Administered 2020-02-15 – 2020-02-19 (×9): 15 mL via OROMUCOSAL

## 2020-02-15 MED ORDER — METOPROLOL SUCCINATE ER 25 MG PO TB24: 25.0000 mg | ORAL_TABLET | Freq: Two times a day (BID) | ORAL | Status: DC

## 2020-02-15 MED ORDER — NOREPINEPHRINE 4 MG/250ML-% IV SOLN
0.0000 ug/min | INTRAVENOUS | Status: DC
  Filled 2020-02-15: qty 250

## 2020-02-15 MED ORDER — GADOBUTROL 1 MMOL/ML IV SOLN
10.0000 mL | Freq: Once | INTRAVENOUS | Status: AC | PRN
  Administered 2020-02-15: 10 mL via INTRAVENOUS

## 2020-02-15 MED ORDER — ENOXAPARIN SODIUM 40 MG/0.4ML ~~LOC~~ SOLN
40.0000 mg | SUBCUTANEOUS | Status: DC
  Administered 2020-02-17 – 2020-02-20 (×4): 40 mg via SUBCUTANEOUS
  Filled 2020-02-15 (×3): qty 0.4

## 2020-02-15 MED ORDER — FENTANYL CITRATE (PF) 100 MCG/2ML IJ SOLN
INTRAMUSCULAR | Status: DC | PRN
  Administered 2020-02-15: 50 ug via INTRAVENOUS

## 2020-02-15 MED ORDER — PHENYLEPHRINE HCL-NACL 10-0.9 MG/250ML-% IV SOLN: 0.0000 ug/min | INTRAVENOUS | Status: DC

## 2020-02-15 MED ORDER — ROCURONIUM BROMIDE 10 MG/ML (PF) SYRINGE
PREFILLED_SYRINGE | INTRAVENOUS | Status: DC | PRN
  Administered 2020-02-15: 50 mg via INTRAVENOUS

## 2020-02-15 MED ORDER — SODIUM CHLORIDE 0.9 % IV SOLN
INTRAVENOUS | Status: DC | PRN
  Administered 2020-02-15: 40 mL

## 2020-02-15 MED ORDER — SODIUM CHLORIDE 0.9 % IV SOLN: INTRAVENOUS | Status: DC

## 2020-02-15 MED ORDER — GLUCAGON HCL RDNA (DIAGNOSTIC) 1 MG IJ SOLR
INTRAMUSCULAR | Status: AC
  Filled 2020-02-15: qty 1

## 2020-02-15 MED ORDER — FENTANYL CITRATE (PF) 100 MCG/2ML IJ SOLN: 25.0000 ug | INTRAMUSCULAR | Status: DC | PRN

## 2020-02-15 MED ORDER — ALBUMIN HUMAN 5 % IV SOLN
12.5000 g | Freq: Once | INTRAVENOUS | Status: AC
  Administered 2020-02-15: 12.5 g via INTRAVENOUS
  Filled 2020-02-15: qty 250

## 2020-02-15 NOTE — Progress Notes (Signed)
Pts vital signs scored pt in red mews. Temp was 102.7. pulse was 133. Scheduled tylenol had been given at 2250. Ibuprofen given at 2335. Applied ice packs to patient to help fever decrease.  Dr Ninfa Linden was notified. New orders were given.

## 2020-02-15 NOTE — Progress Notes (Signed)
MD on call notified of patients blood pressure being 87/55 after 500 cc NS bolus . Orders given.

## 2020-02-15 NOTE — TOC Progression Note (Signed)
Transition of Care [facility]) - Progression Note    Patient Details  Name: Shannon Garrett MRN: [id number] Date of Birth: 1961/01/31  Transition of [facility] CM/SW Contact  Joaquin Courts, RN Phone Number: 02/15/2020, 10:44 AM  Clinical Narrative:    Patient is a long term resident at [facility].  CM clarified and not PT/OT evals are needed for patient to return.  Patient can return when she is medically stable.  TOC will continue to follow for medical stability and coordinate dc accordingly.   Expected Discharge Plan: [place] Barriers to Discharge: Continued Medical Work up  Expected Discharge Plan and Services Expected Discharge Plan: [place]   Discharge Planning Services: CM Consult Post Acute Care Choice: Resumption of Svcs/PTA Provider   Expected Discharge Date: 02/14/20               DME Arranged: N/A DME Agency: NA       HH Arranged: NA HH Agency: NA         Social Determinants of Health (SDOH) Interventions    Readmission Risk Interventions No flowsheet data found.

## 2020-02-15 NOTE — Anesthesia Procedure Notes (Signed)
Procedure Name: Intubation Date/Time: 02/15/2020 10:54 PM Performed by: Montel Clock, CRNA Pre-anesthesia Checklist: Patient identified, Emergency Drugs available, Suction available, Patient being monitored and Timeout performed Patient Re-evaluated:Patient Re-evaluated prior to induction Oxygen Delivery Method: Circle system utilized Preoxygenation: Pre-oxygenation with 100% oxygen Induction Type: IV induction and Cricoid Pressure applied Ventilation: Mask ventilation without difficulty Laryngoscope Size: Mac and 3 Grade View: Grade II Tube type: Oral Tube size: 7.5 mm Number of attempts: 1 Airway Equipment and Method: Stylet Placement Confirmation: ETT inserted through vocal cords under direct vision,  positive ETCO2 and breath sounds checked- equal and bilateral Secured at: 21 cm Tube secured with: Tape Dental Injury: Teeth and Oropharynx as per pre-operative assessment

## 2020-02-15 NOTE — Progress Notes (Signed)
Brief GI Progress Note  Was able to get an OnCall team available for this patient. However, patient in ICU and not table. SBPs in the 60-70s/30-40s and remains tachycardic. Only single IV the ICU RN team is trying to get more access. I personally called and spoke with ICU team ELink to try and get the patient evaluated to have CVL placement and for optimization so that we can perform emergent decompression attempt of the biliary tract in the setting of progressive deteroriation in likely Cholangitis. I have written for 1L bolus and for additional IVs. I have written for the ICU service peripheral neosynephrine to be available until the ICU team can evaluate and optimize the patient. We are currently on standby. I have discussed case with Anesthesia, she is not a candidate for sedation in her current state. Meropenem pending administration. If she is not optimized over the next couple of hours, then she will likely need PBD evaluation by IR.  Justice Britain, MD [facility] Office # [phone]

## 2020-02-15 NOTE — Progress Notes (Signed)
Pharmacy Antibiotic Note  Shannon Garrett is a 59 y.o. female admitted on 02/12/2020 with IAI.  Pharmacy has been consulted for Zosyn dosing. Noted that patient has PCN allergy listed - allergy entered in 2010 and patient has tolerated 2 courses of Zosyn since in 2016 and 2017  Plan: Zosyn 3.375g IV q8 (extended interval infusion) If patient tolerates Zosyn again, will delete PCN allergy from chart  Height: 5\' 4"  (162.6 cm) Weight: 113.4 kg (250 lb) IBW/kg (Calculated) : 54.7  Temp (24hrs), Avg:99.7 F (37.6 C), Min:98.5 F (36.9 C), Max:102.7 F (39.3 C)  Recent Labs  Lab 02/12/20 0800 02/13/20 0443 02/14/20 0550 02/15/20 0505  WBC 5.9 11.9* 8.7 17.2*  CREATININE 0.94 0.96 0.89 1.17*    Estimated Creatinine Clearance: 65.5 mL/min (A) (by C-G formula based on SCr of 1.17 mg/dL (H)).    Allergies  Allergen Reactions  . Sulfonamide Derivatives Hives and Shortness Of Breath  . Penicillins Hives and Itching    Prior course of rocephin charted 05/2015 Also note has tolerated Zosyn in past 2016 and 2017     Thank you for allowing pharmacy to be a part of this patient's care.  Kara Mead 02/15/2020 9:27 AM

## 2020-02-15 NOTE — Progress Notes (Signed)
PCCM Note  Called to bedside of hemodynamically unstable patient. She has hypotension requiring pressor support and central line placement. I asked patient in the event she were to deteriorate, would she want to be placed on mechanical ventilation. She is ok with temporary ventilation. No CPR.  Assessment Septic shock secondary to cholangitis  Plan Start peripheral pressors Place CVL OK to intubate for respiratory failure if needed. No chest compressions  Full consult note to follow  Rodman Pickle, M.D. [facility] Pulmonary/Critical Care Medicine 02/15/2020 8:33 PM

## 2020-02-15 NOTE — Anesthesia Procedure Notes (Signed)
Arterial Line Insertion Start/End8/16/2021 10:35 PM, 02/15/2020 10:40 PM Performed by: Roderic Palau, MD  Patient location: Pre-op. Preanesthetic checklist: patient identified, IV checked, site marked, risks and benefits discussed, surgical consent, monitors and equipment checked, pre-op evaluation, timeout performed and anesthesia consent Lidocaine 1% used for infiltration Left, radial was placed Catheter size: 20 Fr Hand hygiene performed  and maximum sterile barriers used   Attempts: 1 Procedure performed without using ultrasound guided technique. Following insertion, dressing applied and Biopatch. Post procedure assessment: normal and unchanged  Patient tolerated the procedure well with no immediate complications.

## 2020-02-15 NOTE — Op Note (Addendum)
[facility] Patient Name: Shannon Garrett Procedure Date: 02/15/2020 MRN: [id number] Attending MD: Justice Britain , MD Date of Birth: Apr 05, 1961 CSN: [id number] Age: 59 Admit Type: Inpatient Procedure:                ERCP Indications:              Recent Pancreatitis, Bile duct stone(s), Bile duct                            stone on magnetic resonance                            cholangiopancreatography, Ascending cholangitis,                            For therapy of ascending cholangitis, Jaundice,                            Elevated liver enzymes, Postop exam: Cholecystectomy Providers:                Justice Britain, MD, Benay Pillow, RN, Carlyn Reichert, RN, Laverda Sorenson, Technician, Adair Laundry, CRNA Referring MD:             ICU Team, [facility] Medicines:                General Anesthesia, Merrem 1000 mg IV, Indomethacin                            100 mg PR, Glucagon 6.96 mg IV Complications:            No immediate complications. Estimated Blood Loss:     Estimated blood loss was minimal. Procedure:                Pre-Anesthesia Assessment:                           - Prior to the procedure, a History and Physical                            was performed, and patient medications and                            allergies were reviewed. The patient's tolerance of                            previous anesthesia was also reviewed. The risks                            and benefits of the procedure and the sedation                            options and  risks were discussed with the patient.                            All questions were answered, and informed consent                            was obtained. Prior Anticoagulants: The patient has                            taken Lovenox (enoxaparin), last dose was day of                            procedure. ASA Grade Assessment: IV - A patient                             with severe systemic disease that is a constant                            threat to life. After reviewing the risks and                            benefits, the patient was deemed in satisfactory                            condition to undergo the procedure.                           After obtaining informed consent, the scope was                            passed under direct vision. Throughout the                            procedure, the patient's blood pressure, pulse, and                            oxygen saturations were monitored continuously. The                            [id number]-Q180V ([id number]) Olympus Doudenoscope was                            introduced through the mouth, and used to inject                            contrast into and used to inject contrast into the                            bile duct. The ERCP was accomplished without                            difficulty. The patient tolerated the procedure. Scope In: Scope Out: Findings:      A scout film of the abdomen  was obtained. Surgical clips, consistent       with a previous cholecystectomy, were seen in the area of the right       upper quadrant of the abdomen.      The esophagus was successfully intubated under direct vision without       detailed examination of the pharynx, larynx, and associated structures,       and upper GI tract. The major papilla was congested, edematous,       ulcerated.      After engaging the ampulla, a short 0.035 inch Soft Jagwire was passed       into the biliary tree. The Autotome sphincterotome was passed over the       guidewire and the bile duct was then deeply cannulated. Contrast was       injected. I personally interpreted the bile duct images. Ductal flow of       contrast was adequate. Image quality was adequate. Contrast extended to       the hepatic ducts. Opacification of the entire biliary tree except for       the cystic duct and gallbladder was successful. The  entire biliary tree       was moderately dilated. The largest diameter was 16 mm. The lower third       of the main bile duct and middle third of the main bile duct contained       filling defects thought to be stones and sludge. A 6 mm biliary       sphincterotomy was made with a monofilament Autotome sphincterotome       using ERBE electrocautery. There was no post-sphincterotomy bleeding.       Dilation of the common bile duct as a sphincteroplasty with an 02-07-09 mm       balloon (to a maximum balloon size of 9 mm) dilator was successful for a       total of 4 minutes. To discover objects, the biliary tree was swept with       a retrieval balloon starting at the bifurcation. Pus was swept from the       duct. Six stones were removed. No stones remained. An occlusion       cholangiogram was performed that showed no further significant biliary       pathology.      A pancreatogram was not performed.      The duodenoscope was withdrawn from the patient. Impression:               - The major papilla appeared congested, edematous,                            ulcerated.                           - Filling defects consistent with stones and sludge                            were seen on the cholangiogram.                           - The entire biliary tree was moderately dilated.                           -  Choledocholithiasis and Cholangitis was found.                            Complete removal of stones was accomplished by                            biliary sphincterotomy and ballon sphincteroplasty                            and balloon sweep. Moderate Sedation:      Not Applicable - Patient had care per Anesthesia. Recommendation:           - The patient will be observed post-procedure,                            until all discharge criteria are met.                           - Return patient to ICU for ongoing care.                           - Advance diet as tolerated.                            - Observe patient's clinical course.                           - Continue Antibiotics for at least 7-day course or                            longer if patient found to be bacteremic.                           - Check liver enzymes (AST, ALT, alkaline                            phosphatase, bilirubin) in the morning.                           - Watch for pancreatitis, bleeding, perforation,                            and cholangitis.                           - GI Inpatient service to follow up later this                            morning.                           - This will help patient. However, if she continues                            to do poorly, then would ask IR about  drainage/aspiration of the fluid collection in the                            peri-gallbladder region to ensure not an abscess.                           - The findings and recommendations were discussed                            with the patient.                           - The findings and recommendations were discussed                            with the referring physician. Procedure Code(s):        --- Professional ---                           [id number], Endoscopic retrograde                            cholangiopancreatography (ERCP); with removal of                            calculi/debris from biliary/pancreatic duct(s) Diagnosis Code(s):        --- Professional ---                           K83.8, Other specified diseases of biliary tract                           K80.30, Calculus of bile duct with cholangitis,                            unspecified, without obstruction                           K83.09, Other cholangitis                           R17, Unspecified jaundice                           R74.8, Abnormal levels of other serum enzymes                           Z09, Encounter for follow-up examination after                            completed treatment for  conditions other than                            malignant neoplasm                           Z90.49, Acquired absence of other specified parts  of digestive tract                           R93.2, Abnormal findings on diagnostic imaging of                            liver and biliary tract CPT copyright 2019 [employer]. All rights reserved. The codes documented in this report are preliminary and upon coder review may  be revised to meet current compliance requirements. Justice Britain, MD 02/15/2020 11:48:41 PM Number of Addenda: 0

## 2020-02-15 NOTE — Progress Notes (Signed)
Spoke with Altha Harm, Nursing Coordinator with [facility] and gave update to her.

## 2020-02-15 NOTE — Consult Note (Signed)
Medical Consultation   Catrina Fellenz  NFA:[id number]  DOB: [birth year]  DOA: 02/12/2020  PCP: Gildardo Cranker, DO    Requesting physician:Micheal Maczis  Reason for consultation: Sepsis  History of Present Illness: Landis Cassaro is an 59 y.o. female with past medical history systolic congestive heart failure, depression, HTN , COVID-19 infection in 05/2019 , hypertension, multiple sclerosis who  was admitted here under general surgery service  for lap cholecystectomy.  She was admitted here in January this year with cholecystitis.  At the time cholecystectomy was not possible and she underwent percutaneous cholecystostomy tube placement.  She underwent lap chole with drain placement on 02/12/2020 and was planned for skilled nursing facility placement but she developed fever, tachycardia and hypotension overnight with elevated white cell counts, elevated lipase, elevated LFTs.  Sepsis secondary to possible cholangitis was suspected.  Started on broad-spectrum antibiotics, blood culture sent.  We were consulted for medical management.  GI also following. Patient seen and examined the bedside this afternoon.  Currently her blood pressure has improved with IV fluids, but she remains tachycardic and blood pressure still soft.  Plan is to move her to stepdown.  GI planning for ERCP.    Review of Systems:  ROS As per HPI otherwise 10 point review of systems negative.    Past Medical History: Past Medical History:  Diagnosis Date  . Cardiomyopathy ([place])    a.  Echo 04/29/12: Mild LVH, EF 20-25%, mild AI, moderate MR, moderate LAE, mild RAE, mild RVE, moderate TR, PASP 51, small pericardial effusion;   b. probably non-ischemic given multiple chemo-Tx agents used for MS and global LV dysfn on echo  . Chronic systolic heart failure ([place])   . COVID-19 05/10/2019   Per facility paperwork  . Decubitus ulcer    stage II sacral decubitus ulcer.   . Depression   . Glaucoma   .  Hypertension   . MS (multiple sclerosis) ([place])    a. Dx'd late 20's. b. Tx with Novantrone, Tysabri, Copaxone previously.    Past Surgical History: Past Surgical History:  Procedure Laterality Date  . ABLATION     uterine  . CESAREAN SECTION    . CHOLECYSTECTOMY N/A 07/20/2019   Procedure: DIAGNOSTIC LAPAROSCOPY;  Surgeon: [name], MD;  Location: [facility];  Service: General;  Laterality: N/A;  . CHOLECYSTECTOMY N/A 02/12/2020   Procedure: LAPAROSCOPIC CHOLECYSTECTOMY;  Surgeon: Clovis Riley, MD;  Location: [facility];  Service: General;  Laterality: N/A;  . IR EXCHANGE BILIARY DRAIN  09/07/2019  . IR PERC CHOLECYSTOSTOMY  07/21/2019  . LEFT HEART CATH AND CORONARY ANGIOGRAPHY N/A 10/29/2019   Procedure: LEFT HEART CATH AND CORONARY ANGIOGRAPHY;  Surgeon: Leonie Man, MD;  Location: [facility];  Service: Cardiovascular;  Laterality: N/A;     Allergies:   Allergies  Allergen Reactions  . Sulfonamide Derivatives Hives and Shortness Of Breath  . Penicillins Hives and Itching    Prior course of rocephin charted 05/2015 Also note has tolerated Zosyn in past 2016 and 2017     Social History:  reports that she has quit smoking. She has a 2.50 pack-year smoking history. She has never used smokeless tobacco. She reports that she does not drink alcohol and does not use drugs.   Family History: Family History  Problem Relation Age of Onset  . Hypertension Mother   . Cancer Mother  breast   . Cancer Father        prostate  . Multiple sclerosis Sister   . Heart attack Neg Hx      Physical Exam: Vitals:   02/15/20 1019 02/15/20 1130 02/15/20 1538 02/15/20 1608  BP:  120/62 107/63 108/60  Pulse:  88 (!) 126 (!) 124  Resp:  18 (!) 28 (!) 28  Temp:  98.6 F (37 C) (!) 102.9 F (39.4 C) 100.1 F (37.8 C)  TempSrc:  Oral Oral Oral  SpO2:  100% 100% 98%  Weight: 113.3 kg     Height: 5\' 4"  (1.626 m)       General exam: Deconditioned, debilitated, morbidly  obese, weak HEENT:PERRL,Oral mucosa moist, Ear/Nose normal on gross exam Respiratory system: Bilateral diminished air sounds on the bases  cardiovascular system: Sinus tachycardia ,no JVD, murmurs, rubs, gallops or clicks. Gastrointestinal system: Abdomen is distended, soft and has severe generalized tenderness. No organomegaly or masses felt. Normal bowel sounds heard. Central nervous system: Alert and oriented. Paraplegia. Extremities: No edema, no clubbing ,no cyanosis Skin: No rashes, lesions or ulcers,no icterus ,no pallor   Data reviewed:  I have personally reviewed following labs and imaging studies Labs:  CBC: Recent Labs  Lab 02/12/20 0800 02/13/20 0443 02/14/20 0550 02/15/20 0505  WBC 5.9 11.9* 8.7 17.2*  NEUTROABS 3.3  --   --   --   HGB 12.4 10.6* 9.3* 9.6*  HCT 37.2 32.5* 29.1* 28.6*  MCV 86.5 87.4 88.7 86.1  PLT 313 313 233 824    Basic Metabolic Panel: Recent Labs  Lab 02/12/20 0800 02/12/20 0800 02/13/20 0443 02/13/20 0443 02/14/20 0550 02/15/20 0505  NA 141  --  135  --  141 140  K 4.0   < > 3.8   < > 3.7 3.7  CL 107  --  104  --  110 106  CO2 26  --  24  --  24 24  GLUCOSE 77  --  122*  --  68* 178*  BUN 9  --  12  --  13 15  CREATININE 0.94  --  0.96  --  0.89 1.17*  CALCIUM 8.9  --  8.2*  --  8.3* 8.3*   < > = values in this interval not displayed.   GFR Estimated Creatinine Clearance: 65.4 mL/min (A) (by C-G formula based on SCr of 1.17 mg/dL (H)). Liver Function Tests: Recent Labs  Lab 02/12/20 0800 02/13/20 0443 02/14/20 0550 02/15/20 0742  AST 24 45* 23 95*  ALT 28 54* 42 118*  ALKPHOS 226* 195* 189* 532*  BILITOT 0.4 0.6 0.7 2.9*  PROT 6.6 6.0* 5.6* 5.9*  ALBUMIN 3.4* 3.0* 2.7* 2.8*   Recent Labs  Lab 02/15/20 0742  LIPASE 1,808*   No results for input(s): AMMONIA in the last 168 hours. Coagulation profile No results for input(s): INR, PROTIME in the last 168 hours.  Cardiac Enzymes: No results for input(s): CKTOTAL,  CKMB, CKMBINDEX, TROPONINI in the last 168 hours. BNP: Invalid input(s): POCBNP CBG: Recent Labs  Lab 02/14/20 1728 02/14/20 2251 02/14/20 2336 02/15/20 0748 02/15/20 1146  GLUCAP 324* 141* 147* 139* 83   D-Dimer No results for input(s): DDIMER in the last 72 hours. Hgb A1c No results for input(s): HGBA1C in the last 72 hours. Lipid Profile No results for input(s): CHOL, HDL, LDLCALC, TRIG, CHOLHDL, LDLDIRECT in the last 72 hours. Thyroid function studies No results for input(s): TSH, T4TOTAL, T3FREE, THYROIDAB in the last  72 hours.  Invalid input(s): FREET3 Anemia work up No results for input(s): VITAMINB12, FOLATE, FERRITIN, TIBC, IRON, RETICCTPCT in the last 72 hours. Urinalysis    Component Value Date/Time   COLORURINE YELLOW 07/19/2019 1030   APPEARANCEUR CLOUDY (A) 07/19/2019 1030   LABSPEC 1.016 07/19/2019 1030   PHURINE 5.0 07/19/2019 1030   GLUCOSEU NEGATIVE 07/19/2019 1030   HGBUR NEGATIVE 07/19/2019 1030   BILIRUBINUR NEGATIVE 07/19/2019 1030   KETONESUR NEGATIVE 07/19/2019 1030   PROTEINUR NEGATIVE 07/19/2019 1030   UROBILINOGEN 1.0 01/10/2015 2311   NITRITE NEGATIVE 07/19/2019 1030   LEUKOCYTESUR LARGE (A) 07/19/2019 1030     Microbiology Recent Results (from the past 240 hour(s))  SARS Coronavirus 2 by RT PCR (hospital order, performed in [place] hospital lab) Nasopharyngeal Nasopharyngeal Swab     Status: None   Collection Time: 02/12/20  7:14 AM   Specimen: Nasopharyngeal Swab  Result Value Ref Range Status   SARS Coronavirus 2 NEGATIVE NEGATIVE Final    Comment: (NOTE) SARS-CoV-2 target nucleic acids are NOT DETECTED.  The SARS-CoV-2 RNA is generally detectable in upper and lower respiratory specimens during the acute phase of infection. The lowest concentration of SARS-CoV-2 viral copies this assay can detect is 250 copies / mL. A negative result does not preclude SARS-CoV-2 infection and should not be used as the sole basis for treatment  or other patient management decisions.  A negative result may occur with improper specimen collection / handling, submission of specimen other than nasopharyngeal swab, presence of viral mutation(s) within the areas targeted by this assay, and inadequate number of viral copies (<250 copies / mL). A negative result must be combined with clinical observations, patient history, and epidemiological information.  Fact Sheet for Patients:   StrictlyIdeas.no  Fact Sheet for Healthcare Providers: BankingDealers.co.za  This test is not yet approved or  cleared by the [place] FDA and has been authorized for detection and/or diagnosis of SARS-CoV-2 by FDA under an Emergency Use Authorization (EUA).  This EUA will remain in effect (meaning this test can be used) for the duration of the COVID-19 declaration under Section 564(b)(1) of the Act, 21 U.S.C. section 360bbb-3(b)(1), unless the authorization is terminated or revoked sooner.  Performed at [facility], [place] [address], [place],  [address]        Inpatient Medications:   Scheduled Meds: . acetaminophen  1,000 mg Oral Q6H  . amantadine  100 mg Oral Daily  . baclofen  5 mg Oral QID  . buPROPion  150 mg Oral Daily  . docusate sodium  100 mg Oral BID  . DULoxetine  60 mg Oral Daily  . enoxaparin (LOVENOX) injection  40 mg Subcutaneous Q24H  . famotidine  20 mg Oral BID  . gabapentin  800 mg Oral TID  . insulin aspart  0-15 Units Subcutaneous TID WC  . lubiprostone  24 mcg Oral BID WC  . melatonin  10 mg Oral QHS  . metoprolol succinate  25 mg Oral BID  . pantoprazole  40 mg Oral Daily  . spironolactone  25 mg Oral Daily   Continuous Infusions: . sodium chloride 100 mL/hr at 02/15/20 1451  . piperacillin-tazobactam (ZOSYN)  IV 12.5 mL/hr at 02/15/20 1451     Radiological Exams on Admission: DG Chest 2 View  Result Date: 02/15/2020 CLINICAL DATA:   Fever.  Bilateral upper extremity swelling. EXAM: CHEST - 2 VIEW COMPARISON:  Chest x-ray dated May 10, 2019. FINDINGS: Stable cardiomediastinal silhouette. Normal  pulmonary vascularity. Low lung volumes. Trace right pleural effusion. No focal consolidation or pneumothorax. No acute osseous abnormality. IMPRESSION: 1. Trace right pleural effusion. Electronically Signed   By: Titus Dubin M.D.   On: 02/15/2020 08:30   MR 3D Recon At Scanner  Result Date: 02/15/2020 CLINICAL DATA:  59 year old female with history of abdominal pain. Elevated bilirubin level. Status post laparoscopic cholecystectomy. Evaluate for biliary tract obstruction (retained stone in the common bile duct). EXAM: MRI ABDOMEN WITHOUT AND WITH CONTRAST (INCLUDING MRCP) TECHNIQUE: Multiplanar multisequence MR imaging of the abdomen was performed both before and after the administration of intravenous contrast. Heavily T2-weighted images of the biliary and pancreatic ducts were obtained, and three-dimensional MRCP images were rendered by post processing. CONTRAST:  9mL GADAVIST GADOBUTROL 1 MMOL/ML IV SOLN COMPARISON:  No prior abdominal MRI. CT the abdomen and pelvis 06/29/2019. FINDINGS: Lower chest: Dependent areas of signal intensity are noted in the lower lobes of the lungs bilaterally, which may reflect areas of atelectasis or consolidation (poorly evaluated by magnetic resonance). Hepatobiliary: No suspicious cystic or solid hepatic lesions. Status post cholecystectomy. Linear signal void in the gallbladder fossa, presumably a surgical drain. The tip of this drainage catheter extends into a small postoperative fluid collection in the gallbladder fossa which measures approximately 5.4 x 1.6 cm (axial image 26 of series 3). MRCP images demonstrate mild intrahepatic biliary ductal dilatation. There is also marked dilatation of the common bile duct which measures up to 18 mm in diameter in the porta hepatis. There are numerous filling  defects within the common bile duct compatible with retained ductal stones. Pancreas: No pancreatic mass. Mild dilatation of the main pancreatic duct (4 mm). Small amount of peripancreatic increased T2 signal intensity compatible with trace amount of peripancreatic fluid/inflammation. No well-defined pancreatic or peripancreatic fluid collections. Spleen:  Unremarkable. Adrenals/Urinary Tract: Numerous subcentimeter T1 hypointense, T2 hyperintense, nonenhancing lesions in both kidneys, compatible with tiny simple cysts. No suspicious renal lesions. Bilateral adrenal glands are normal in appearance. No hydroureteronephrosis in the visualized portions of the abdomen. Stomach/Bowel: Visualized portions are unremarkable. Vascular/Lymphatic: Aortic atherosclerosis, without evidence of aneurysm in the abdominal vasculature. No lymphadenopathy noted in the abdomen. Other: Small amount of increased T2 signal intensity in the retroperitoneum adjacent to the pancreas. No significant volume of ascites. Musculoskeletal: No aggressive appearing osseous lesions are noted in the visualized portions of the skeleton. IMPRESSION: 1. Status post cholecystectomy with surgical drain in the gallbladder fossa which appears to extend into a small postoperative fluid collection which likely represents a small postoperative seroma. 2. Severely dilated common bile duct with extensive choledocholithiasis filling the common bile duct. This is associated with both mild intrahepatic biliary ductal dilatation, and mild pancreatic ductal dilatation, as well as evidence of acute pancreatitis, as detailed above. 3. Dependent areas of signal intensity in the lower lobes of the lungs bilaterally favored to reflect some postoperative atelectasis, although some degree of airspace consolidation is not entirely excluded. Electronically Signed   By: Vinnie Langton M.D.   On: 02/15/2020 14:59   MR ABDOMEN MRCP W WO CONTAST  Result Date:  02/15/2020 CLINICAL DATA:  59 year old female with history of abdominal pain. Elevated bilirubin level. Status post laparoscopic cholecystectomy. Evaluate for biliary tract obstruction (retained stone in the common bile duct). EXAM: MRI ABDOMEN WITHOUT AND WITH CONTRAST (INCLUDING MRCP) TECHNIQUE: Multiplanar multisequence MR imaging of the abdomen was performed both before and after the administration of intravenous contrast. Heavily T2-weighted images of the biliary and pancreatic ducts  were obtained, and three-dimensional MRCP images were rendered by post processing. CONTRAST:  29mL GADAVIST GADOBUTROL 1 MMOL/ML IV SOLN COMPARISON:  No prior abdominal MRI. CT the abdomen and pelvis 06/29/2019. FINDINGS: Lower chest: Dependent areas of signal intensity are noted in the lower lobes of the lungs bilaterally, which may reflect areas of atelectasis or consolidation (poorly evaluated by magnetic resonance). Hepatobiliary: No suspicious cystic or solid hepatic lesions. Status post cholecystectomy. Linear signal void in the gallbladder fossa, presumably a surgical drain. The tip of this drainage catheter extends into a small postoperative fluid collection in the gallbladder fossa which measures approximately 5.4 x 1.6 cm (axial image 26 of series 3). MRCP images demonstrate mild intrahepatic biliary ductal dilatation. There is also marked dilatation of the common bile duct which measures up to 18 mm in diameter in the porta hepatis. There are numerous filling defects within the common bile duct compatible with retained ductal stones. Pancreas: No pancreatic mass. Mild dilatation of the main pancreatic duct (4 mm). Small amount of peripancreatic increased T2 signal intensity compatible with trace amount of peripancreatic fluid/inflammation. No well-defined pancreatic or peripancreatic fluid collections. Spleen:  Unremarkable. Adrenals/Urinary Tract: Numerous subcentimeter T1 hypointense, T2 hyperintense, nonenhancing  lesions in both kidneys, compatible with tiny simple cysts. No suspicious renal lesions. Bilateral adrenal glands are normal in appearance. No hydroureteronephrosis in the visualized portions of the abdomen. Stomach/Bowel: Visualized portions are unremarkable. Vascular/Lymphatic: Aortic atherosclerosis, without evidence of aneurysm in the abdominal vasculature. No lymphadenopathy noted in the abdomen. Other: Small amount of increased T2 signal intensity in the retroperitoneum adjacent to the pancreas. No significant volume of ascites. Musculoskeletal: No aggressive appearing osseous lesions are noted in the visualized portions of the skeleton. IMPRESSION: 1. Status post cholecystectomy with surgical drain in the gallbladder fossa which appears to extend into a small postoperative fluid collection which likely represents a small postoperative seroma. 2. Severely dilated common bile duct with extensive choledocholithiasis filling the common bile duct. This is associated with both mild intrahepatic biliary ductal dilatation, and mild pancreatic ductal dilatation, as well as evidence of acute pancreatitis, as detailed above. 3. Dependent areas of signal intensity in the lower lobes of the lungs bilaterally favored to reflect some postoperative atelectasis, although some degree of airspace consolidation is not entirely excluded. Electronically Signed   By: Vinnie Langton M.D.   On: 02/15/2020 14:59    Impression/Recommendations Principal Problem:   Cholecystitis Active Problems:   Obesity   MS (multiple sclerosis) ([facility])   Chronic diastolic CHF (congestive heart failure) ([facility])   Paraplegia ([facility])   Insulin dependent diabetes mellitus type IA ([place])   Sepsis/possible cholangitis:Status post lap cholecystectomy with drain placment.  Meets sepsis criteria with  fever, hypotension, tachycardia, leukocytosis overnight.  Elevated lipase and liver enzymes.  Continue current antibiotics.  She is on meropenem, on IV  fluids.  Follow-up blood cultures.  MRCP showed severely dilated common bile duct with extensive choledocholithiasis filling the common bile duct,associated mild intrahepatic biliary ductal dilatation, and mild pancreatic ductal dilatation, as well as evidence of acute pancreatitis.  GI following. Patient was septic on last admission when she was admitted for cholecystitis, her bile cultures at that time showed ESBL E. coli and Enterococcus and she was treated with antibiotics.  She was discharged on cholecystostomy drain. Currently patient is n.p.o. Continuing monitoring CMP.  GI planning for ERCP.  Acute hypoxic respiratory failure: Currently on 2 L of oxygen per minute.  Chest x-ray showed trace right pleural effusion, continue to  attempt to wean the oxygen.This is most likely from atelectasis from recent surgery.  History of chronic systolic congestive heart failure: Echo done on 1/21 showed EF of 40 to 45%, global hypokinesis, moderately elevated pulmonary artery pressure.  She is on spironolactone  at home.  Currently she is hypotensive and needs IV fluids. Cardiac cath done on 4/21 showed nonischemic cardiomyopathy, angiographically normal coronary arteries. Her metoprolol and small lateral have been held due to hypotension.  Insulin-dependent diabetes: Monitor blood sugars.  Currently on sliding scale insulin.  Last hemoglobin A1C was 7.6.  Morbid obesity: BMI of 42.8.  Elevated troponin: Most likely this is from sepsis.  No further work-up necessary.  Normocytic anemia: Currently hemoglobin stable.  Continue to monitor  Debility/deconditioning/multiple sclerosis: Patient has history of multiple sclerosis and paraplegic, bed bound. Plan for skilled nursing facility placement, she is long-term nursing home resident.  On amantadine, baclofen  Depression: On bupropion, duloxetine.   Thank you for this consultation.  Our [facility] hospitalist team will follow the patient with you.   Time  Spent: 73 mins  Shelly Coss M.D. [facility] [phone] 02/15/2020, 4:32 PM

## 2020-02-15 NOTE — Progress Notes (Signed)
Patient MEWS red after taking vitals coming back from MRCP. Patient was already in [place] previously on night shift and was on q4 vitals. Patient tachypneic at 28 and tachycardic at 126. Patient temp elevated. Tylenol was given before these vitals were given. PA made aware. Bolus started.

## 2020-02-15 NOTE — Procedures (Signed)
Central Venous Catheter Insertion Procedure Note  Nusaiba Guallpa  [id number]  03-06-1961  Date:02/15/20  Time:9:56 PM   Provider Performing:Chanita Boden Rodman Pickle   Procedure: Insertion of Non-tunneled Central Venous [phone]) with US guidance ([id number])   Indication(s) Medication administration  Consent Risks of the procedure as well as the alternatives and risks of each were explained to the patient and/or caregiver.  Consent for the procedure was obtained and is signed in the bedside chart  Anesthesia Topical only with 1% lidocaine   Timeout Verified patient identification, verified procedure, site/side was marked, verified correct patient position, special equipment/implants available, medications/allergies/relevant history reviewed, required imaging and test results available.  Sterile Technique Maximal sterile technique including full sterile barrier drape, hand hygiene, sterile gown, sterile gloves, mask, hair covering, sterile ultrasound probe cover (if used).  Procedure Description Area of catheter insertion was cleaned with chlorhexidine and draped in sterile fashion.  With real-time ultrasound guidance a central venous catheter was placed into the left internal jugular vein. Nonpulsatile blood flow and easy flushing noted in all ports.  The catheter was sutured in place and sterile dressing applied.  Complications/Tolerance None; patient tolerated the procedure well. Chest X-ray is ordered to verify placement for internal jugular or subclavian cannulation. Reviewed and appropriate position.  Chest x-ray is not ordered for femoral cannulation.  EBL Minimal  Specimen(s) None

## 2020-02-15 NOTE — Consult Note (Signed)
NAME:  Shannon Garrett, MRN:  [id number], DOB:  02-06-1961, LOS: 0 ADMISSION DATE:  02/12/2020, CONSULTATION DATE:  02/15/20 REFERRING MD:  Dr. Justice Britain, MD, CHIEF COMPLAINT:  Septic shock  Brief History   59 year old female with recent cholecystectomy with developing septic shock secondary to cholangitis   History of present illness   Ms. Shannon Garrett is a 59 year old female with MS (nonambulatory at baseline), chronic systolic heart failure, HTN and hx of cholecystitis s/p perc cholecystostomy tube in 07/2019 due to inflammation who was admitted for elective cholecystectomy. Intra-op on 8/13, severe fibrosis and inflammation of the gallbladder and surrounding tissues noted. JP drain remained. In the last 24 hours, she became febrile and noted to have worsening LFTs, lipase and WBC. She was started on broad spectrum abx for suspected cholangitis. She developed septic shock and PCCM was consulted for pressor support and management. At bedside her SBP in the 60s. IVF bolus given. GI planning for emergent ERCP. Patient reports fevers and diffuse abdominal pain. Central line placed and started on neo gtt. BP improved to 130s.  Past Medical History  MS (nonambulatory at baseline), chronic systolic heart failure, HTN and hx of cholecystitis s/p perc cholecystostomy tube   Significant Hospital Events   8/13 Cholecystecomy  Consults:  GI PCCM  Procedures:  L CVL 8/16>  Significant Diagnostic Tests:  MRCP 8/16 - S/p cholecystectomy. Drainage catheter in post-op fluid collection measured 5.4x 1.6 cm in the gallbladder fossa, mild intrahepatic ductal dilation and common bile duct dilation with multiple filling defects in the CB, trace peripancreatic intensity with trace fluid/inflammation  CXR 08/16 - No pneumothorax. Interval placement of L IJ CVC in appropriate position  Micro Data:  BCX 8/16>  Antimicrobials:  Zosyn 8/16 Meropenem 8/16  Interim history/subjective:  As  above  Objective   Blood pressure (!) 83/52, pulse (!) 110, temperature (!) 100.9 F (38.3 C), temperature source Oral, resp. rate 18, height 5\' 4"  (1.626 m), weight 113.3 kg, SpO2 100 %.        Intake/Output Summary (Last 24 hours) at 02/15/2020 2114 Last data filed at 02/15/2020 1800 Gross per 24 hour  Intake 1423.72 ml  Output 480 ml  Net 943.72 ml   Filed Weights   02/08/20 1205 02/12/20 0715 02/15/20 1019  Weight: 113.4 kg 113.4 kg 113.3 kg   Physical Exam: General: Chronically ill-appearing, no acute distress, contractured HENT: [place], AT, OP clear, MMM Eyes: EOMI, no scleral icterus Respiratory: Clear to auscultation bilaterally.  No crackles, wheezing or rales Cardiovascular: RRR, -M/R/G, no JVD GI: BS+, soft, tender to palpation, JP drain in place with serosanguinous drainage Extremities:-Edema,-tenderness Neuro: AAO x4, CNII-XII grossly intact, paraplegia Skin: Intact, no rashes or bruising Psych: Normal mood, normal affect  Resolved Hospital Problem list     Assessment & Plan:   Septic shock secondary to cholangitis --Wean Neo for MAP goal >65. If persistently hypotensive post-ERCP, will switch to Levophed --Continue Meropenem --F/u final cultures --ERCP now --Trend LA  Choledocholithiasis/Cholangitis Gallstone Pancreatitis S/p cholecystectomy 3/41 complicated by fibrotic inflammation with JP drain in place Elevated lipase --Continue Meropenem --ERCP  Chronic systolic heart failure (DQ22-29%) Hypertension ?Adrenal insufficiency --Check cortisol --Consider stress dose steroids if continues to have pressor need  DM CBG q4h  Multiple Sclerosis  Best practice:  Diet: NPO Pain/Anxiety/Delirium protocol (if indicated): PRN fentanyl VAP protocol (if indicated): --- DVT prophylaxis: Lovenox GI prophylaxis: PPI and pepcid daily Glucose control: CBG q4h while NPO Mobility: BR Code Status:  DNR (no intubation, no chest compressions). OK to intubate if  temporary for procedure Family Communication: Updated patient at bedside 8/16. Attempted to call sister, no answer. Left callback message Disposition: Admit to ICU  Labs   CBC: Recent Labs  Lab 02/12/20 0800 02/13/20 0443 02/14/20 0550 02/15/20 0505  WBC 5.9 11.9* 8.7 17.2*  NEUTROABS 3.3  --   --   --   HGB 12.4 10.6* 9.3* 9.6*  HCT 37.2 32.5* 29.1* 28.6*  MCV 86.5 87.4 88.7 86.1  PLT 313 313 233 846    Basic Metabolic Panel: Recent Labs  Lab 02/12/20 0800 02/13/20 0443 02/14/20 0550 02/15/20 0505  NA 141 135 141 140  K 4.0 3.8 3.7 3.7  CL 107 104 110 106  CO2 26 24 24 24   GLUCOSE 77 122* 68* 178*  BUN 9 12 13 15   CREATININE 0.94 0.96 0.89 1.17*  CALCIUM 8.9 8.2* 8.3* 8.3*   GFR: Estimated Creatinine Clearance: 65.4 mL/min (A) (by C-G formula based on SCr of 1.17 mg/dL (H)). Recent Labs  Lab 02/12/20 0800 02/13/20 0443 02/14/20 0550 02/15/20 0505  WBC 5.9 11.9* 8.7 17.2*    Liver Function Tests: Recent Labs  Lab 02/12/20 0800 02/13/20 0443 02/14/20 0550 02/15/20 0742  AST 24 45* 23 95*  ALT 28 54* 42 118*  ALKPHOS 226* 195* 189* 532*  BILITOT 0.4 0.6 0.7 2.9*  PROT 6.6 6.0* 5.6* 5.9*  ALBUMIN 3.4* 3.0* 2.7* 2.8*   Recent Labs  Lab 02/15/20 0742  LIPASE 1,808*   No results for input(s): AMMONIA in the last 168 hours.  ABG    Component Value Date/Time   PHART 7.420 07/10/2015 1801   PCO2ART 41.2 07/10/2015 1801   PO2ART 417.0 (H) 07/10/2015 1801   HCO3 26.7 (H) 07/10/2015 1801   TCO2 28 07/10/2015 1801   O2SAT 100.0 07/10/2015 1801     Coagulation Profile: Recent Labs  Lab 02/15/20 1708  INR 1.1    Cardiac Enzymes: No results for input(s): CKTOTAL, CKMB, CKMBINDEX, TROPONINI in the last 168 hours.  HbA1C: Hemoglobin A1C  Date/Time Value Ref Range Status  12/10/2017 12:00 AM 6.2  Final  06/04/2017 12:00 AM 5.7  Final   Hgb A1c MFr Bld  Date/Time Value Ref Range Status  02/12/2020 08:00 AM 5.2 4.8 - 5.6 % Final    Comment:     (NOTE) Pre diabetes:          5.7%-6.4%  Diabetes:              >6.4%  Glycemic control for   <7.0% adults with diabetes   05/10/2019 07:55 PM 7.6 (H) 4.8 - 5.6 % Final    Comment:    (NOTE) Pre diabetes:          5.7%-6.4% Diabetes:              >6.4% Glycemic control for   <7.0% adults with diabetes     CBG: Recent Labs  Lab 02/14/20 2251 02/14/20 2336 02/15/20 0748 02/15/20 1146 02/15/20 1642  GLUCAP 141* 147* 139* 83 90    Review of Systems:   Review of Systems  Constitutional: Positive for fever. Negative for chills, diaphoresis, malaise/fatigue and weight loss.  HENT: Negative for congestion, ear pain and sore throat.   Respiratory: Negative for cough, hemoptysis, sputum production, shortness of breath and wheezing.   Cardiovascular: Negative for chest pain, palpitations and leg swelling.  Gastrointestinal: Positive for abdominal pain. Negative for heartburn and nausea.  Genitourinary: Negative  for frequency.  Musculoskeletal: Negative for joint pain and myalgias.  Skin: Negative for itching and rash.  Neurological: Negative for dizziness, weakness and headaches.  Endo/Heme/Allergies: Does not bruise/bleed easily.  Psychiatric/Behavioral: Negative for depression. The patient is not nervous/anxious.      Past Medical History  She,  has a past medical history of Cardiomyopathy [facility]), Chronic systolic heart failure ([place]), COVID-19 (05/10/2019), Decubitus ulcer, Depression, Glaucoma, Hypertension, and MS (multiple sclerosis) ([place]).   Surgical History    Past Surgical History:  Procedure Laterality Date  . ABLATION     uterine  . CESAREAN SECTION    . CHOLECYSTECTOMY N/A 07/20/2019   Procedure: DIAGNOSTIC LAPAROSCOPY;  Surgeon: [name], MD;  Location: [facility];  Service: General;  Laterality: N/A;  . CHOLECYSTECTOMY N/A 02/12/2020   Procedure: LAPAROSCOPIC CHOLECYSTECTOMY;  Surgeon: Clovis Riley, MD;  Location: [facility];  Service: General;   Laterality: N/A;  . IR EXCHANGE BILIARY DRAIN  09/07/2019  . IR PERC CHOLECYSTOSTOMY  07/21/2019  . LEFT HEART CATH AND CORONARY ANGIOGRAPHY N/A 10/29/2019   Procedure: LEFT HEART CATH AND CORONARY ANGIOGRAPHY;  Surgeon: Leonie Man, MD;  Location: [facility];  Service: Cardiovascular;  Laterality: N/A;     Social History   reports that she has quit smoking. She has a 2.50 pack-year smoking history. She has never used smokeless tobacco. She reports that she does not drink alcohol and does not use drugs.   Family History   Her family history includes Cancer in her father and mother; Hypertension in her mother; Multiple sclerosis in her sister. There is no history of Heart attack.   Allergies Allergies  Allergen Reactions  . Sulfonamide Derivatives Hives and Shortness Of Breath  . Penicillins Hives and Itching    Prior course of rocephin charted 05/2015 Also note has tolerated Zosyn in past 2016 and 2017     Home Medications  Prior to Admission medications   Medication Sig Start Date End Date Taking? Authorizing Provider  acetaminophen (TYLENOL) 325 MG tablet Take 650 mg by mouth every 8 (eight) hours as needed for mild pain, moderate pain or headache.    Yes [provider]  albuterol (VENTOLIN HFA) 108 (90 Base) MCG/ACT inhaler Inhale 2 puffs into the lungs every 6 (six) hours as needed for wheezing or shortness of breath.  07/10/19  Yes [provider]  amantadine (SYMMETREL) 100 MG capsule Take 100 mg by mouth daily.    Yes [provider]  ascorbic acid (VITAMIN C) 500 MG tablet Take 500 mg by mouth daily.    Yes [provider]  aspirin 325 MG EC tablet Take 325 mg by mouth daily.   Yes [provider]  Baclofen 5 MG TABS Take 5 mg by mouth 4 (four) times daily. Hold if lethargic or confused  12/04/17  Yes [provider]  buPROPion (WELLBUTRIN SR) 150 MG 12 hr tablet Take 150 mg by mouth daily. 01/23/20  Yes [provider]  Cranberry 400 MG TABS Take 400 mg by mouth daily.    Yes [provider]  diphenhydrAMINE (BENADRYL) 25 mg capsule Take 25 mg by mouth every 8 (eight) hours as needed for itching.   Yes [provider]  DULoxetine HCl 60 MG CSDR Take 60 mg by mouth daily.  07/18/19  Yes [provider]  Ensure (ENSURE) Take 237 mLs by mouth 2 (two) times daily between meals.   Yes [provider]  famotidine (PEPCID)  20 MG tablet Take 1 tablet (20 mg total) by mouth 2 (two) times daily. 07/25/15  Yes Rai, Ripudeep K, MD  ferrous sulfate 325 (65 FE) MG tablet Take 325 mg by mouth 2 (two) times daily with a meal.  02/20/18  Yes [provider]  fluticasone (FLONASE) 50 MCG/ACT nasal spray Place 2 sprays into both nostrils every evening.   Yes [provider]  gabapentin (NEURONTIN) 800 MG tablet Take 800 mg by mouth 3 (three) times daily.   Yes [provider]  ibuprofen (ADVIL) 400 MG tablet Take 400 mg by mouth every 12 (twelve) hours as needed for moderate pain.   Yes [provider]  insulin aspart (NOVOLOG FLEXPEN) 100 UNIT/ML FlexPen 0-15 Units, Subcutaneous, 3 times daily with meals CBG < 70: implement hypoglycemia protocol-call MD CBG 70 - 120: 0 units CBG 121 - 150: 2 units CBG 151 - 200: 3 units CBG 201 - 250: 5 units CBG 251 - 300: 8 units CBG 301 - 350: 11 units CBG 351 - 400: 15 units CBG > 400: Patient taking differently: Inject 0-15 Units into the skin 3 (three) times daily with meals. 0-15 Units, Subcutaneous, 3 times daily with meals CBG < 70: implement hypoglycemia protocol-call MD CBG 70 - 120: 0 units CBG 121 - 150: 2 units CBG 151 - 200: 3 units CBG 201 - 250: 5 units CBG 251 - 300: 8 units CBG 301 - 350: 11 units CBG 351 - 400: 15 units CBG > 400: 05/18/19  Yes Ghimire, Henreitta Leber, MD  insulin glargine (LANTUS) 100 UNIT/ML injection Inject 0.1 mLs (10 Units total) into the skin daily. Patient taking  differently: Inject 10 Units into the skin at bedtime.  05/19/19  Yes Ghimire, Henreitta Leber, MD  Lactobacillus (PROBIOTIC ACIDOPHILUS) CAPS Take 1 capsule by mouth daily.    Yes [provider]  latanoprost (XALATAN) 0.005 % ophthalmic solution Place 1 drop into both eyes at bedtime.   Yes [provider]  lubiprostone (AMITIZA) 24 MCG capsule Take 24 mcg by mouth 2 (two) times daily with a meal.   Yes [provider]  Melatonin 5 MG TABS Take 10 mg by mouth at bedtime.  11/14/17  Yes [provider]  metoprolol succinate (TOPROL-XL) 100 MG 24 hr tablet Take 150 mg by mouth daily. Take with or immediately following a meal.   Yes [provider]  Multiple Vitamins-Minerals (MULTIVITAMIN ADULT PO) Take 1 tablet by mouth daily.   Yes [provider]  omeprazole (PRILOSEC) 20 MG capsule Take 20 mg by mouth daily.   Yes [provider]  ondansetron (ZOFRAN) 4 MG tablet Take 4 mg by mouth every 6 (six) hours as needed for nausea or vomiting. 11/01/17  Yes [provider]  potassium chloride (KLOR-CON) 20 MEQ packet Take 20 mEq by mouth daily.    Yes [provider]  senna-docusate (SENOKOT S) 8.6-50 MG tablet Take 2 tablets by mouth 2 (two) times daily.    Yes [provider]  simethicone (MYLICON) 062 MG chewable tablet Chew 125 mg by mouth every 6 (six) hours as needed for flatulence.   Yes [provider]  sodium phosphate Pediatric (FLEET) 3.5-9.5 GM/59ML enema Place 1 enema rectally once as needed for mild constipation, moderate constipation or severe constipation.   Yes [provider]  spironolactone (ALDACTONE) 25 MG tablet Take 25 mg by mouth daily. For CHF   Yes [provider]  tamsulosin (FLOMAX) 0.4 MG  CAPS capsule Take 0.4 mg by mouth daily. For incontinence   Yes [provider]  traMADol (ULTRAM) 50 MG tablet Take 100 mg by mouth every 6 (six) hours as needed (Pain).   Yes  [provider]     Critical care time: 35 min    The patient is critically ill with multiple organ systems failure and requires high complexity decision making for assessment and support, frequent evaluation and titration of therapies, application of advanced monitoring technologies and extensive interpretation of multiple databases.  Independent Critical Care Time: 32 Minutes.   Rodman Pickle, M.D. [facility] Pulmonary/Critical Care Medicine 02/15/2020 9:16 PM   Please see Amion for pager number to reach on-call Pulmonary and Critical Care Team.

## 2020-02-15 NOTE — NC FL2 (Signed)
[place] LEVEL OF CARE SCREENING TOOL     IDENTIFICATION  Patient Name: Shannon Garrett Birthdate: 29-Jan-1961 Sex: female Admission Date (Current Location): 02/12/2020  [facility] and [place] Number:  Herbalist and Address:  [facility],  [place] [address], [place]      Provider Number: [id number]  Attending Physician Name and Address:  Clovis Riley, MD  Relative Name and Phone Number:       Current Level of Care: Hospital Recommended Level of Care: [place] Prior Approval Number:    Date Approved/Denied:   PASRR Number: [id number]  Discharge Plan: SNF    Current Diagnoses: Patient Active Problem List   Diagnosis Date Noted  . Cholecystitis 02/12/2020  . Abnormal nuclear stress test   . Preoperative cardiovascular examination   . Acute calculous cholecystitis 07/19/2019  . Insulin dependent diabetes mellitus type IA ([place]) 07/19/2019  . History of Clostridioides difficile colitis 07/19/2019  . Multiple drug resistant organism (MDRO) Klebsiella pneumonia UTI culture positive 07/19/2019  . Enteritis due to Clostridium difficile 06/24/2019  . COVID-19 virus infection 06/24/2019  . Palliative care by specialist   . DNR (do not resuscitate)   . Sepsis ([place]) 05/10/2019  . History of COVID-19 Nov2020 05/10/2019  . Dysesthesia 08/26/2018  . Dyslipidemia 06/13/2017  . Elevated alkaline phosphatase level 06/13/2017  . Acute frontal sinusitis 05/24/2017  . Influenza 04/19/2017  . Glaucoma 04/06/2017  . Allergic rhinitis due to allergen 03/07/2017  . Vaginal candidiasis 03/01/2017  . UTI (urinary tract infection) 01/02/2017  . High risk medication use 01/25/2016  . Neuropathy 11/13/2015  . GERD (gastroesophageal reflux disease) 11/13/2015  . Dysphagia as late effect of stroke 11/08/2015  . Adrenal insufficiency (Addison's disease) ([place]) 08/12/2015  . Vitamin D deficiency 06/18/2015  . Hypokalemia 06/03/2015   . Chronic anemia 05/30/2015  . Chronic pain syndrome 03/03/2015  . Essential hypertension   . Dilated cardiomyopathy ([place])   . Chronic constipation 11/26/2014  . Depression 11/26/2014  . Adult failure to thrive 07/31/2013  . Insomnia 06/03/2013  . Neurogenic bladder 06/03/2013  . Paraplegia ([place]) 09/05/2012  . Chronic diastolic CHF (congestive heart failure) ([place]) 05/22/2012  . Obesity 11/30/2008  . MS (multiple sclerosis) ([place]) 11/30/2008  . Hypertensive heart disease with CHF (congestive heart failure) ([place]) 11/30/2008    Orientation RESPIRATION BLADDER Height & Weight     Self, Time, Situation, Place  Normal Incontinent Weight: 113.4 kg Height:  5\' 4"  (162.6 cm)  BEHAVIORAL SYMPTOMS/MOOD NEUROLOGICAL BOWEL NUTRITION STATUS      Continent Diet  AMBULATORY STATUS COMMUNICATION OF NEEDS Skin   Extensive Assist Verbally Normal                       Personal Care Assistance Level of Assistance  Bathing, Dressing, Total care Bathing Assistance: Maximum assistance   Dressing Assistance: Maximum assistance Total Care Assistance: Maximum assistance   Functional Limitations Info             SPECIAL CARE FACTORS FREQUENCY                       Contractures Contractures Info: Not present    Additional Factors Info                  Current Medications (02/15/2020):  This is the current hospital active medication list Current Facility-Administered Medications  Medication Dose Route Frequency Provider Last Rate Last Admin  .  0.9 %  sodium chloride infusion   Intravenous Continuous Romana Juniper A, MD      . acetaminophen (TYLENOL) tablet 1,000 mg  1,000 mg Oral Q6H Romana Juniper A, MD   1,000 mg at 02/15/20 0903  . albuterol (PROVENTIL) (2.5 MG/3ML) 0.083% nebulizer solution 2.5 mg  2.5 mg Nebulization Q6H PRN Romana Juniper A, MD      . amantadine (SYMMETREL) capsule 100 mg  100 mg Oral Daily Romana Juniper A, MD   100 mg at 02/15/20 0904  .  baclofen (LIORESAL) tablet 5 mg  5 mg Oral QID Romana Juniper A, MD   5 mg at 02/15/20 0904  . bisacodyl (DULCOLAX) suppository 10 mg  10 mg Rectal Daily PRN Romana Juniper A, MD      . buPROPion [facility] SR) 12 hr tablet 150 mg  150 mg Oral Daily Romana Juniper A, MD   150 mg at 02/15/20 0903  . diphenhydrAMINE (BENADRYL) capsule 25 mg  25 mg Oral Q8H PRN Romana Juniper A, MD      . docusate sodium (COLACE) capsule 100 mg  100 mg Oral BID Romana Juniper A, MD   100 mg at 02/15/20 0903  . DULoxetine (CYMBALTA) DR capsule 60 mg  60 mg Oral Daily Romana Juniper A, MD   60 mg at 02/15/20 0903  . enoxaparin (LOVENOX) injection 40 mg  40 mg Subcutaneous Q24H Romana Juniper A, MD   40 mg at 02/15/20 0901  . famotidine (PEPCID) tablet 20 mg  20 mg Oral BID Romana Juniper A, MD   20 mg at 02/15/20 0903  . fentaNYL (SUBLIMAZE) injection 12.5 mcg  12.5 mcg Intravenous Q2H PRN Romana Juniper A, MD   12.5 mcg at 02/13/20 0936  . gabapentin (NEURONTIN) capsule 800 mg  800 mg Oral TID Romana Juniper A, MD   800 mg at 02/15/20 0903  . hydrALAZINE (APRESOLINE) injection 10 mg  10 mg Intravenous Q2H PRN Romana Juniper A, MD      . ibuprofen (ADVIL) tablet 400 mg  400 mg Oral Q6H PRN Romana Juniper A, MD   400 mg at 02/14/20 2337  . insulin aspart (novoLOG) injection 0-15 Units  0-15 Units Subcutaneous TID WC Romana Juniper A, MD   2 Units at 02/15/20 0900  . lubiprostone (AMITIZA) capsule 24 mcg  24 mcg Oral BID WC Clovis Riley, MD   24 mcg at 02/15/20 0902  . melatonin tablet 10 mg  10 mg Oral QHS Romana Juniper A, MD   10 mg at 02/14/20 2249  . metoprolol succinate (TOPROL-XL) 24 hr tablet 150 mg  150 mg Oral Daily Romana Juniper A, MD   150 mg at 02/14/20 1036  . metoprolol tartrate (LOPRESSOR) injection 5 mg  5 mg Intravenous Q6H PRN Romana Juniper A, MD      . ondansetron (ZOFRAN) tablet 4 mg  4 mg Oral Q6H PRN Romana Juniper A, MD      . pantoprazole (PROTONIX) EC tablet 40 mg  40 mg  Oral Daily Romana Juniper A, MD   40 mg at 02/15/20 0903  . piperacillin-tazobactam (ZOSYN) IVPB 3.375 g  3.375 g Intravenous Q8H Adrian Saran, [place]      . simethicone (MYLICON) chewable tablet 80 mg  80 mg Oral Q6H PRN Romana Juniper A, MD      . sodium phosphate (FLEET) 7-19 GM/118ML enema 1 enema  1 enema Rectal Once PRN Clovis Riley, MD      .  spironolactone (ALDACTONE) tablet 25 mg  25 mg Oral Daily Romana Juniper A, MD   25 mg at 02/15/20 0902  . traMADol (ULTRAM) tablet 100 mg  100 mg Oral Q6H PRN Clovis Riley, MD   100 mg at 02/15/20 4801     Discharge Medications: Please see discharge summary for a list of discharge medications.  Relevant Imaging Results:  Relevant Lab Results:   Additional Information SS # [id number]  Joaquin Courts, RN

## 2020-02-15 NOTE — Anesthesia Preprocedure Evaluation (Addendum)
Anesthesia Evaluation  Patient identified by MRN, date of birth, ID band Patient awake    Reviewed: Allergy & Precautions, H&P , NPO status , Patient's Chart, lab work & pertinent test results, reviewed documented beta blocker date and time   Airway Mallampati: III  TM Distance: >3 FB Neck ROM: Full    Dental no notable dental hx. (+) Partial Upper, Partial Lower, Poor Dentition, Dental Advisory Given   Pulmonary neg pulmonary ROS, former smoker,    Pulmonary exam normal breath sounds clear to auscultation       Cardiovascular hypertension, Pt. on medications and Pt. on home beta blockers +CHF   Rhythm:Regular Rate:Normal     Neuro/Psych Depression negative neurological ROS     GI/Hepatic Neg liver ROS, GERD  ,  Endo/Other  diabetesMorbid obesity  Renal/GU negative Renal ROS  negative genitourinary   Musculoskeletal   Abdominal   Peds  Hematology  (+) Blood dyscrasia, anemia ,   Anesthesia Other Findings   Reproductive/Obstetrics negative OB ROS                            Anesthesia Physical Anesthesia Plan  ASA: III and emergent  Anesthesia Plan: General   Post-op Pain Management:    Induction: Intravenous  PONV Risk Score and Plan: 4 or greater and Ondansetron, Dexamethasone and Midazolam  Airway Management Planned: Oral ETT  Additional Equipment: Arterial line  Intra-op Plan:   Post-operative Plan: Possible Post-op intubation/ventilation  Informed Consent: I have reviewed the patients History and Physical, chart, labs and discussed the procedure including the risks, benefits and alternatives for the proposed anesthesia with the patient or authorized representative who has indicated his/her understanding and acceptance.   Patient has DNR.  Discussed DNR with patient and Suspend DNR.   Dental advisory given  Plan Discussed with: CRNA  Anesthesia Plan Comments:        Anesthesia Quick Evaluation

## 2020-02-15 NOTE — Progress Notes (Signed)
RN contacted pharmacy to clarify abx orders. Spoke with on call GS and given a verbal to [facility] over Zosyn. Pharmacy notified.

## 2020-02-15 NOTE — Consult Note (Signed)
Consultation  Referring Provider: [facility]/Dr. Windle Guard  primary Care Physician:  Gildardo Cranker, DO Primary Gastroenterologist:  Dr.Stark  Reason for Consultation: Choledocholithiasis and pancreatitis  HPI: Shannon Garrett is a 59 y.o. female, with history of MS from which she is debilitated, congestive heart failure with EF 45 to 50%, depression, hypertension who was diagnosed with Covid in November 2020.  She also was treated for C. difficile around that same period of time. She was hospitalized in January 2021 with sepsis found secondary to acute cholecystitis.  She was taken to surgery but the procedure had to be aborted due to the severity of gallbladder disease and inflammation of the surrounding tissues.  She then had percutaneous drainage of the gallbladder done with plans for lap chole in 6 weeks.  She has had the percutaneous drain in since.  She was admitted on 02/12/2020 and underwent laparoscopic cholecystectomy per Dr. Windle Guard.  She was found to have ongoing severe and fibrotic inflammation of the gallbladder wall and surrounding tissues.  IOC was not done and percutaneous drain was left in. She was to be discharged yesterday however developed hypotension, and was subsequently found to have elevated LFTs and elevated lipase.  Chest x-ray done today shows trace right pleural effusion. MRI/MRCP today shows a 5.4 x 1.6 cm fluid collection in the gallbladder fossa in the area of the drain, there is mild intrahepatic ductal dilation and common bile duct is dilated to 18 mm with multiple filling defects in the CBD, also noted to have trace peripancreatic edema.  Yesterday WBC 8.7, hemoglobin 9.3 T bili 0.7/alk phos 189/AST 23/ALT 42   Labs today show WBC of 17.2, hemoglobin 9.6 hematocrit of 28.6 T bili 2.9/alk phos 532/AST 95/ALT 118 and lipase of 1808.  This afternoon, patient feeling poorly, has been febrile to 101, blood pressure soft, pulse currently 120 respirations 28.  She is getting  a fluid bolus. She complains of abdominal soreness but no severe pain.  No nausea or vomiting   On Zosyn since surgery Patient had cultures done March 2021 from the percutaneous drain which grew abundant Enterococcus and E. Coli-resistant to ampicillin Unasyn and Cipro  Also had ESBL UTI November 2021   Past Medical History:  Diagnosis Date  . Cardiomyopathy ([place])    a.  Echo 04/29/12: Mild LVH, EF 20-25%, mild AI, moderate MR, moderate LAE, mild RAE, mild RVE, moderate TR, PASP 51, small pericardial effusion;   b. probably non-ischemic given multiple chemo-Tx agents used for MS and global LV dysfn on echo  . Chronic systolic heart failure ([place])   . COVID-19 05/10/2019   Per facility paperwork  . Decubitus ulcer    stage II sacral decubitus ulcer.   . Depression   . Glaucoma   . Hypertension   . MS (multiple sclerosis) ([place])    a. Dx'd late 20's. b. Tx with Novantrone, Tysabri, Copaxone previously.    Past Surgical History:  Procedure Laterality Date  . ABLATION     uterine  . CESAREAN SECTION    . CHOLECYSTECTOMY N/A 07/20/2019   Procedure: DIAGNOSTIC LAPAROSCOPY;  Surgeon: [name], MD;  Location: [facility];  Service: General;  Laterality: N/A;  . CHOLECYSTECTOMY N/A 02/12/2020   Procedure: LAPAROSCOPIC CHOLECYSTECTOMY;  Surgeon: Clovis Riley, MD;  Location: [facility];  Service: General;  Laterality: N/A;  . IR EXCHANGE BILIARY DRAIN  09/07/2019  . IR PERC CHOLECYSTOSTOMY  07/21/2019  . LEFT HEART CATH AND CORONARY ANGIOGRAPHY N/A 10/29/2019   Procedure:  LEFT HEART CATH AND CORONARY ANGIOGRAPHY;  Surgeon: Marykay Lex, MD;  Location: [facility] INVASIVE CV LAB;  Service: Cardiovascular;  Laterality: N/A;    Prior to Admission medications   Medication Sig Start Date End Date Taking? Authorizing Provider  acetaminophen (TYLENOL) 325 MG tablet Take 650 mg by mouth every 8 (eight) hours as needed for mild pain, moderate pain or headache.    Yes [provider]  albuterol  (VENTOLIN HFA) 108 (90 Base) MCG/ACT inhaler Inhale 2 puffs into the lungs every 6 (six) hours as needed for wheezing or shortness of breath.  07/10/19  Yes [provider]  amantadine (SYMMETREL) 100 MG capsule Take 100 mg by mouth daily.    Yes [provider]  ascorbic acid (VITAMIN C) 500 MG tablet Take 500 mg by mouth daily.    Yes [provider]  aspirin 325 MG EC tablet Take 325 mg by mouth daily.   Yes [provider]  Baclofen 5 MG TABS Take 5 mg by mouth 4 (four) times daily. Hold if lethargic or confused  12/04/17  Yes [provider]  buPROPion (WELLBUTRIN SR) 150 MG 12 hr tablet Take 150 mg by mouth daily. 01/23/20  Yes [provider]  Cranberry 400 MG TABS Take 400 mg by mouth daily.    Yes [provider]  diphenhydrAMINE (BENADRYL) 25 mg capsule Take 25 mg by mouth every 8 (eight) hours as needed for itching.   Yes [provider]  DULoxetine HCl 60 MG CSDR Take 60 mg by mouth daily.  07/18/19  Yes [provider]  Ensure (ENSURE) Take 237 mLs by mouth 2 (two) times daily between meals.   Yes [provider]  famotidine (PEPCID) 20 MG tablet Take 1 tablet (20 mg total) by mouth 2 (two) times daily. 07/25/15  Yes Rai, Ripudeep K, MD  ferrous sulfate 325 (65 FE) MG tablet Take 325 mg by mouth 2 (two) times daily with a meal.  02/20/18  Yes [provider]  fluticasone (FLONASE) 50 MCG/ACT nasal spray Place 2 sprays into both nostrils every evening.   Yes [provider]  gabapentin (NEURONTIN) 800 MG tablet Take 800 mg by mouth 3 (three) times daily.   Yes [provider]  ibuprofen (ADVIL) 400 MG tablet Take 400 mg by mouth every 12 (twelve) hours as needed for moderate pain.   Yes [provider]  insulin aspart (NOVOLOG FLEXPEN) 100 UNIT/ML FlexPen 0-15 Units, Subcutaneous, 3 times daily with meals CBG < 70: implement hypoglycemia protocol-call MD CBG 70 - 120: 0  units CBG 121 - 150: 2 units CBG 151 - 200: 3 units CBG 201 - 250: 5 units CBG 251 - 300: 8 units CBG 301 - 350: 11 units CBG 351 - 400: 15 units CBG > 400: Patient taking differently: Inject 0-15 Units into the skin 3 (three) times daily with meals. 0-15 Units, Subcutaneous, 3 times daily with meals CBG < 70: implement hypoglycemia protocol-call MD CBG 70 - 120: 0 units CBG 121 - 150: 2 units CBG 151 - 200: 3 units CBG 201 - 250: 5 units CBG 251 - 300: 8 units CBG 301 - 350: 11 units CBG 351 - 400: 15 units CBG > 400: 05/18/19  Yes Ghimire, Werner Lean, MD  insulin glargine (LANTUS) 100 UNIT/ML injection Inject 0.1 mLs (10 Units total) into the skin daily. Patient taking differently: Inject 10 Units into the skin at bedtime.  05/19/19  Yes Ghimire, Hydrographic surveyor  M, MD  Lactobacillus (PROBIOTIC ACIDOPHILUS) CAPS Take 1 capsule by mouth daily.    Yes [provider]  latanoprost (XALATAN) 0.005 % ophthalmic solution Place 1 drop into both eyes at bedtime.   Yes [provider]  lubiprostone (AMITIZA) 24 MCG capsule Take 24 mcg by mouth 2 (two) times daily with a meal.   Yes [provider]  Melatonin 5 MG TABS Take 10 mg by mouth at bedtime.  11/14/17  Yes [provider]  metoprolol succinate (TOPROL-XL) 100 MG 24 hr tablet Take 150 mg by mouth daily. Take with or immediately following a meal.   Yes [provider]  Multiple Vitamins-Minerals (MULTIVITAMIN ADULT PO) Take 1 tablet by mouth daily.   Yes [provider]  omeprazole (PRILOSEC) 20 MG capsule Take 20 mg by mouth daily.   Yes [provider]  ondansetron (ZOFRAN) 4 MG tablet Take 4 mg by mouth every 6 (six) hours as needed for nausea or vomiting. 11/01/17  Yes [provider]  potassium chloride (KLOR-CON) 20 MEQ packet Take 20 mEq by mouth daily.    Yes [provider]  senna-docusate (SENOKOT S) 8.6-50 MG tablet Take 2 tablets by mouth 2 (two) times daily.     Yes [provider]  simethicone (MYLICON) 259 MG chewable tablet Chew 125 mg by mouth every 6 (six) hours as needed for flatulence.   Yes [provider]  sodium phosphate Pediatric (FLEET) 3.5-9.5 GM/59ML enema Place 1 enema rectally once as needed for mild constipation, moderate constipation or severe constipation.   Yes [provider]  spironolactone (ALDACTONE) 25 MG tablet Take 25 mg by mouth daily. For CHF   Yes [provider]  tamsulosin (FLOMAX) 0.4 MG CAPS capsule Take 0.4 mg by mouth daily. For incontinence   Yes [provider]  traMADol (ULTRAM) 50 MG tablet Take 100 mg by mouth every 6 (six) hours as needed (Pain).   Yes [provider]    Current Facility-Administered Medications  Medication Dose Route Frequency Provider Last Rate Last Admin  . 0.9 %  sodium chloride infusion   Intravenous Continuous Romana Juniper A, MD 100 mL/hr at 02/15/20 1451 Restarted at 02/15/20 1451  . acetaminophen (TYLENOL) tablet 1,000 mg  1,000 mg Oral Q6H Connor, Chelsea A, MD   1,000 mg at 02/15/20 1450  . albuterol (PROVENTIL) (2.5 MG/3ML) 0.083% nebulizer solution 2.5 mg  2.5 mg Nebulization Q6H PRN Romana Juniper A, MD      . amantadine (SYMMETREL) capsule 100 mg  100 mg Oral Daily Romana Juniper A, MD   100 mg at 02/15/20 0904  . baclofen (LIORESAL) tablet 5 mg  5 mg Oral QID Romana Juniper A, MD   5 mg at 02/15/20 1450  . bisacodyl (DULCOLAX) suppository 10 mg  10 mg Rectal Daily PRN Romana Juniper A, MD      . buPROPion [facility] SR) 12 hr tablet 150 mg  150 mg Oral Daily Romana Juniper A, MD   150 mg at 02/15/20 0903  . diphenhydrAMINE (BENADRYL) capsule 25 mg  25 mg Oral Q8H PRN Romana Juniper A, MD      . docusate sodium (COLACE) capsule 100 mg  100 mg Oral BID Romana Juniper A, MD   100 mg at 02/15/20 0903  . DULoxetine (CYMBALTA) DR capsule 60 mg  60 mg Oral Daily Romana Juniper A, MD   60 mg at 02/15/20 0903  . enoxaparin  (LOVENOX) injection 40 mg  40  mg Subcutaneous Q24H Romana Juniper A, MD   40 mg at 02/15/20 0901  . famotidine (PEPCID) tablet 20 mg  20 mg Oral BID Romana Juniper A, MD   20 mg at 02/15/20 0903  . fentaNYL (SUBLIMAZE) injection 12.5 mcg  12.5 mcg Intravenous Q2H PRN Romana Juniper A, MD   12.5 mcg at 02/13/20 0936  . gabapentin (NEURONTIN) capsule 800 mg  800 mg Oral TID Romana Juniper A, MD   800 mg at 02/15/20 1450  . hydrALAZINE (APRESOLINE) injection 10 mg  10 mg Intravenous Q2H PRN Romana Juniper A, MD      . ibuprofen (ADVIL) tablet 400 mg  400 mg Oral Q6H PRN Romana Juniper A, MD   400 mg at 02/14/20 2337  . insulin aspart (novoLOG) injection 0-15 Units  0-15 Units Subcutaneous TID WC Romana Juniper A, MD   2 Units at 02/15/20 0900  . lubiprostone (AMITIZA) capsule 24 mcg  24 mcg Oral BID WC Clovis Riley, MD   24 mcg at 02/15/20 0902  . melatonin tablet 10 mg  10 mg Oral QHS Romana Juniper A, MD   10 mg at 02/14/20 2249  . metoprolol succinate (TOPROL-XL) 24 hr tablet 150 mg  150 mg Oral Daily Romana Juniper A, MD   150 mg at 02/14/20 1036  . metoprolol tartrate (LOPRESSOR) injection 5 mg  5 mg Intravenous Q6H PRN Romana Juniper A, MD      . ondansetron (ZOFRAN) tablet 4 mg  4 mg Oral Q6H PRN Romana Juniper A, MD      . pantoprazole (PROTONIX) EC tablet 40 mg  40 mg Oral Daily Romana Juniper A, MD   40 mg at 02/15/20 0903  . piperacillin-tazobactam (ZOSYN) IVPB 3.375 g  3.375 g Intravenous Q8H Adrian Saran, RPH 12.5 mL/hr at 02/15/20 1451 Restarted at 02/15/20 1451  . simethicone (MYLICON) chewable tablet 80 mg  80 mg Oral Q6H PRN Romana Juniper A, MD      . sodium chloride 0.9 % bolus 500 mL  500 mL Intravenous Once Maczis, Michael M, PA-C      . sodium phosphate (FLEET) 7-19 GM/118ML enema 1 enema  1 enema Rectal Once PRN Romana Juniper A, MD      . spironolactone (ALDACTONE) tablet 25 mg  25 mg Oral Daily Romana Juniper A, MD   25 mg at 02/15/20 0902  . traMADol  (ULTRAM) tablet 100 mg  100 mg Oral Q6H PRN Romana Juniper A, MD   100 mg at 02/15/20 0237    Allergies as of 01/05/2020 - Review Complete 10/29/2019  Allergen Reaction Noted  . Sulfonamide derivatives Hives and Shortness Of Breath 11/30/2008  . Penicillins Hives and Itching 11/30/2008    Family History  Problem Relation Age of Onset  . Hypertension Mother   . Cancer Mother        breast   . Cancer Father        prostate  . Multiple sclerosis Sister   . Heart attack Neg Hx     Social History   Socioeconomic History  . Marital status: Single    Spouse name: Not on file  . Number of children: Not on file  . Years of education: Not on file  . Highest education level: Not on file  Occupational History  . Not on file  Tobacco Use  . Smoking status: Former Smoker    Packs/day: 0.50    Years: 5.00    Pack years: 2.50  .  Smokeless tobacco: Never Used  Vaping Use  . Vaping Use: Never used  Substance and Sexual Activity  . Alcohol use: No  . Drug use: No  . Sexual activity: Not Currently  Other Topics Concern  . Not on file  Social History Narrative  . Not on file   Social Determinants of Health   Financial Resource Strain:   . Difficulty of Paying Living Expenses:   Food Insecurity:   . Worried About Charity fundraiser in the Last Year:   . Arboriculturist in the Last Year:   Transportation Needs:   . Film/video editor (Medical):   Marland Kitchen Lack of Transportation (Non-Medical):   Physical Activity:   . Days of Exercise per Week:   . Minutes of Exercise per Session:   Stress:   . Feeling of Stress :   Social Connections:   . Frequency of Communication with Friends and Family:   . Frequency of Social Gatherings with Friends and Family:   . Attends Religious Services:   . Active Member of Clubs or Organizations:   . Attends Archivist Meetings:   Marland Kitchen Marital Status:   Intimate Partner Violence:   . Fear of Current or Ex-Partner:   . Emotionally  Abused:   Marland Kitchen Physically Abused:   . Sexually Abused:     Review of Systems: Pertinent positive and negative review of systems were noted in the above HPI section.  All other review of systems was otherwise negative.  Physical Exam: Vital signs in last 24 hours: Temp:  [98.5 F (36.9 C)-102.9 F (39.4 C)] 102.9 F (39.4 C) (08/16 1538) Pulse Rate:  [88-133] 126 (08/16 1538) Resp:  [14-28] 28 (08/16 1538) BP: (83-156)/(53-84) 107/63 (08/16 1538) SpO2:  [97 %-100 %] 100 % (08/16 1538) Weight:  [113.3 kg] 113.3 kg (08/16 1019) Last BM Date: 02/12/20 General:   Alert, acute and chronically ill-appearing African-American female pleasant and cooperative in NAD Head:  Normocephalic and atraumatic. Eyes:  Sclera clear, no icterus.   Conjunctiva pink. Ears:  Normal auditory acuity. Nose:  No deformity, discharge,  or lesions. Mouth:  No deformity or lesions.   Neck:  Supple; no masses or thyromegaly. Lungs: Tachypneic clear throughout to auscultation.   No wheezes, crackles, or rhonchi. Heart: Tacky regular rate and rhythm; no murmurs, clicks, rubs,  or gallops. Abdomen:  Soft, tender across the upper abdomen, BS active,nonpalp mass or hsm.  Percutaneous drain in the right mid quadrant, minimal pinkish cloudy drainage Rectal:  Deferred  Msk:  Symmetrical without gross deformities. . Pulses:  Normal pulses noted. Extremities:  Without clubbing or edema. Neurologic:  Alert and  oriented x4;  grossly normal neurologically. Skin:  Intact without significant lesions or rashes.. Psych:  Alert and cooperative. Normal mood and affect.  Intake/Output from previous day: 08/15 0701 - 08/16 0700 In: 992.6 [IV Piggyback:992.6] Out: 800 [Urine:800] Intake/Output this shift: Total I/O In: 414.4 [I.V.:378.3; IV Piggyback:36.1] Out: 180 [Urine:150; Drains:30]  Lab Results: Recent Labs    02/13/20 0443 02/14/20 0550 02/15/20 0505  WBC 11.9* 8.7 17.2*  HGB 10.6* 9.3* 9.6*  HCT 32.5* 29.1*  28.6*  PLT 313 233 238   BMET Recent Labs    02/13/20 0443 02/14/20 0550 02/15/20 0505  NA 135 141 140  K 3.8 3.7 3.7  CL 104 110 106  CO2 '24 24 24  '$ GLUCOSE 122* 68* 178*  BUN '12 13 15  '$ CREATININE 0.96 0.89 1.17*  CALCIUM 8.2* 8.3*  8.3*   LFT Recent Labs    02/15/20 0742  PROT 5.9*  ALBUMIN 2.8*  AST 95*  ALT 118*  ALKPHOS 532*  BILITOT 2.9*  BILIDIR 2.3*  IBILI 0.6   PT/INR No results for input(s): LABPROT, INR in the last 72 hours. Hepatitis Panel No results for input(s): HEPBSAG, HCVAB, HEPAIGM, HEPBIGM in the last 72 hours.    IMPRESSION:  #26  59 year old African-American female with history of severe cholecystitis January 2021, surgery aborted secondary to severity of gallbladder inflammation and percutaneous drain was placed which has been present since. Patient admitted 813 to undergo laparoscopic cholecystectomy.  Found to have severe chronic inflammation and fibrosis of the gallbladder wall and surrounding tissues and cholelithiasis.  No IOC done.  LFTs normal on admission  By 2 days postop complaining of increased abdominal pain and noted to be hypotensive.  Patient met SIRS criteria felt secondary to biliary pancreatitis. Patient has been on IV Zosyn. MRCP today confirms multiple common bile duct stones, and chronic fluid collection in the gallbladder fossa with percutaneous drain within this collection. Significant worsening of lab parameters today and patient has been febrile, tachycardic with soft blood pressures all consistent with sepsis secondary to cholangitis.  #2 MS 3.  Cardiomyopathy EF 45 to 50% 4.  History of Covid for which she was hospitalized November 2021 5.  History of C. difficile winter 2021 6.  ESBL UTI Winter 2021  PLAN:  N.p.o. except ice chips Complete fluid bolus then increase IV fluids to 125/h Stop Zosyn and switch to meropenem Transfer to stepdown Patient will need ERCP with stone extraction, as soon as this can be  scheduled, certainly within the next 24 hours.  Dr. Rush Landmark has seen the patient as well.  Procedure discussed in detail with the patient including indications risks and benefits. If team can be coordinated this evening, may proceed this evening. We will follow with you    Lodie Waheed PA-C 02/15/2020, 3:46 PM

## 2020-02-15 NOTE — Progress Notes (Addendum)
3 Days Post-Op  Subjective: CC: Patient with fever, tachycardia and hypotension overnight. Reports that her epigastric and RUQ abdominal pain that was presented became more severe and constant overnight. No n/v. She has some sob but no chest pain or cough. Currently on o2. Passing flatus. No BM. No urinary symptoms.   Objective: Vital signs in last 24 hours: Temp:  [98.5 F (36.9 C)-102.7 F (39.3 C)] 98.6 F (37 C) (08/16 0729) Pulse Rate:  [91-133] 91 (08/16 0909) Resp:  [14-18] 18 (08/16 0729) BP: (83-156)/(53-84) 116/56 (08/16 0909) SpO2:  [97 %-100 %] 100 % (08/16 0729) Last BM Date: 02/12/20  Intake/Output from previous day: 08/15 0701 - 08/16 0700 In: 992.6 [IV Piggyback:992.6] Out: 800 [Urine:800] Intake/Output this shift: No intake/output data recorded.  PE: Gen:  Alert, NAD, pleasant Card:  RRR Pulm:  CTA to upper lung fields. Distant at bases. Normal rate and effort. On o2.  Abd: Soft, RUQ> epigastric> right mid abdominal tenderness. No peritonitis. +BS. Incisions with glue intact appears well and are without drainage, bleeding, or signs of infection. JP drain just emptied and reported to be tea colored. Drain currently with scant sanguinous output.  Ext:  No LE edema or calf tenderness. Chronic LE contractures  Skin: no rashes noted, warm and dry   Lab Results:  Recent Labs    02/14/20 0550 02/15/20 0505  WBC 8.7 17.2*  HGB 9.3* 9.6*  HCT 29.1* 28.6*  PLT 233 238   BMET Recent Labs    02/14/20 0550 02/15/20 0505  NA 141 140  K 3.7 3.7  CL 110 106  CO2 24 24  GLUCOSE 68* 178*  BUN 13 15  CREATININE 0.89 1.17*  CALCIUM 8.3* 8.3*   PT/INR No results for input(s): LABPROT, INR in the last 72 hours. CMP     Component Value Date/Time   NA 140 02/15/2020 0505   NA 144 10/26/2019 1100   K 3.7 02/15/2020 0505   CL 106 02/15/2020 0505   CO2 24 02/15/2020 0505   GLUCOSE 178 (H) 02/15/2020 0505   BUN 15 02/15/2020 0505   BUN 21 10/26/2019  1100   CREATININE 1.17 (H) 02/15/2020 0505   CALCIUM 8.3 (L) 02/15/2020 0505   PROT 5.9 (L) 02/15/2020 0742   PROT 6.4 08/26/2018 0959   ALBUMIN 2.8 (L) 02/15/2020 0742   ALBUMIN 3.9 08/26/2018 0959   AST 95 (H) 02/15/2020 0742   ALT 118 (H) 02/15/2020 0742   ALKPHOS 532 (H) 02/15/2020 0742   BILITOT 2.9 (H) 02/15/2020 0742   BILITOT 0.2 08/26/2018 0959   GFRNONAA 52 (L) 02/15/2020 0505   GFRAA 60 (L) 02/15/2020 0505   Lipase     Component Value Date/Time   LIPASE 1,808 (H) 02/15/2020 0742       Studies/Results: DG Chest 2 View  Result Date: 02/15/2020 CLINICAL DATA:  Fever.  Bilateral upper extremity swelling. EXAM: CHEST - 2 VIEW COMPARISON:  Chest x-ray dated May 10, 2019. FINDINGS: Stable cardiomediastinal silhouette. Normal pulmonary vascularity. Low lung volumes. Trace right pleural effusion. No focal consolidation or pneumothorax. No acute osseous abnormality. IMPRESSION: 1. Trace right pleural effusion. Electronically Signed   By: Titus Dubin M.D.   On: 02/15/2020 08:30    Anti-infectives: Anti-infectives (From admission, onward)   Start     Dose/Rate Route Frequency Ordered Stop   02/15/20 1000  piperacillin-tazobactam (ZOSYN) IVPB 3.375 g     Discontinue     3.375 g 12.5 mL/hr over 240 Minutes Intravenous  Every 8 hours 02/15/20 0932     02/12/20 1500  metroNIDAZOLE (FLAGYL) IVPB 500 mg        500 mg 100 mL/hr over 60 Minutes Intravenous Every 8 hours 02/12/20 1433 02/12/20 1701   02/12/20 1500  ciprofloxacin (CIPRO) IVPB 400 mg        400 mg 200 mL/hr over 60 Minutes Intravenous Every 12 hours 02/12/20 1433 02/12/20 1655   02/12/20 0600  vancomycin (VANCOREADY) IVPB 1500 mg/300 mL        1,500 mg 150 mL/hr over 120 Minutes Intravenous On call to O.R. 02/11/20 8833 02/12/20 1035       Assessment/Plan MS HTN Hx CHF ABL Anemia - Hgb 9.6. Hyperglycemia - SSI. A1c 5.2 R pleural effusion - On o2. Wean as able LBBB - noted on workup. Hx of LBBB on  EKG 1/17. Tn 27. No CP  Hx of Acute Cholecystitis tx w/ Perc Chole tube 07/2019 S/p Laparoscopic Cholecystectomy w/ drain placement by Dr. Kae Heller 02/12/2020 - POD #3 - Patient with fever of 102.7, tachycardia and hypotension overnight - Labs w/ WBC 17.2. Lipase 1808, T bili 2.9, elevated LFT's. Will get MRCP  - Maintain drain, monitor for bilious output - NPO - Start abx  FEN - NPO, IVF VTE - SCDs, Lovenox  ID - Cirpo/Flagyl peri-op. Zosyn 8/16 >>    LOS: 0 days    Jillyn Ledger , [facility] 02/15/2020, 9:39 AM Please see Amion for pager number during day hours 7:00am-4:30pm

## 2020-02-16 ENCOUNTER — Telehealth: Payer: Self-pay

## 2020-02-16 DIAGNOSIS — K8050 Calculus of bile duct without cholangitis or cholecystitis without obstruction: Secondary | ICD-10-CM

## 2020-02-16 DIAGNOSIS — K8309 Other cholangitis: Secondary | ICD-10-CM

## 2020-02-16 DIAGNOSIS — Z9889 Other specified postprocedural states: Secondary | ICD-10-CM

## 2020-02-16 LAB — COMPREHENSIVE METABOLIC PANEL
ALT: 71 U/L — ABNORMAL HIGH (ref 0–44)
AST: 30 U/L (ref 15–41)
Albumin: 2.8 g/dL — ABNORMAL LOW (ref 3.5–5.0)
Alkaline Phosphatase: 391 U/L — ABNORMAL HIGH (ref 38–126)
Anion gap: 13 (ref 5–15)
BUN: 23 mg/dL — ABNORMAL HIGH (ref 6–20)
CO2: 21 mmol/L — ABNORMAL LOW (ref 22–32)
Calcium: 7.7 mg/dL — ABNORMAL LOW (ref 8.9–10.3)
Chloride: 110 mmol/L (ref 98–111)
Creatinine, Ser: 1.31 mg/dL — ABNORMAL HIGH (ref 0.44–1.00)
GFR calc Af Amer: 52 mL/min — ABNORMAL LOW (ref >60)
GFR calc non Af Amer: 45 mL/min — ABNORMAL LOW (ref >60)
Glucose, Bld: 102 mg/dL — ABNORMAL HIGH (ref 70–99)
Potassium: 3.8 mmol/L (ref 3.5–5.1)
Sodium: 144 mmol/L (ref 135–145)
Total Bilirubin: 2.9 mg/dL — ABNORMAL HIGH (ref 0.3–1.2)
Total Protein: 5.6 g/dL — ABNORMAL LOW (ref 6.5–8.1)

## 2020-02-16 LAB — CORTISOL: Cortisol, Plasma: 7.7 ug/dL

## 2020-02-16 LAB — BLOOD CULTURE ID PANEL (REFLEXED) - BCID2

## 2020-02-16 LAB — GLUCOSE, CAPILLARY
Glucose-Capillary: 114 mg/dL — ABNORMAL HIGH (ref 70–99)
Glucose-Capillary: 123 mg/dL — ABNORMAL HIGH (ref 70–99)
Glucose-Capillary: 126 mg/dL — ABNORMAL HIGH (ref 70–99)
Glucose-Capillary: 126 mg/dL — ABNORMAL HIGH (ref 70–99)
Glucose-Capillary: 131 mg/dL — ABNORMAL HIGH (ref 70–99)
Glucose-Capillary: 141 mg/dL — ABNORMAL HIGH (ref 70–99)
Glucose-Capillary: 141 mg/dL — ABNORMAL HIGH (ref 70–99)
Glucose-Capillary: 146 mg/dL — ABNORMAL HIGH (ref 70–99)

## 2020-02-16 LAB — URINALYSIS, ROUTINE W REFLEX MICROSCOPIC
Glucose, UA: NEGATIVE mg/dL
Ketones, ur: NEGATIVE mg/dL
Nitrite: NEGATIVE
Protein, ur: 30 mg/dL — AB
Specific Gravity, Urine: 1.019 (ref 1.005–1.030)
WBC, UA: >50 WBC/hpf — ABNORMAL HIGH (ref 0–5)
pH: 5 (ref 5.0–8.0)

## 2020-02-16 LAB — URINE CULTURE: Culture: 20000 — AB

## 2020-02-16 LAB — LIPASE, BLOOD: Lipase: 308 U/L — ABNORMAL HIGH (ref 11–51)

## 2020-02-16 LAB — CBC
HCT: 25.3 % — ABNORMAL LOW (ref 36.0–46.0)
Hemoglobin: 8.4 g/dL — ABNORMAL LOW (ref 12.0–15.0)
MCH: 28.8 pg (ref 26.0–34.0)
MCHC: 33.2 g/dL (ref 30.0–36.0)
MCV: 86.6 fL (ref 80.0–100.0)
Platelets: 204 10*3/uL (ref 150–400)
RBC: 2.92 MIL/uL — ABNORMAL LOW (ref 3.87–5.11)
RDW: 14.8 % (ref 11.5–15.5)
WBC: 16.2 10*3/uL — ABNORMAL HIGH (ref 4.0–10.5)
nRBC: 0 % (ref 0.0–0.2)

## 2020-02-16 MED ORDER — CHLORHEXIDINE GLUCONATE CLOTH 2 % EX PADS
6.0000 | MEDICATED_PAD | Freq: Every day | CUTANEOUS | Status: DC
  Administered 2020-02-17 – 2020-02-19 (×3): 6 via TOPICAL

## 2020-02-16 MED ORDER — VANCOMYCIN HCL 1250 MG/250ML IV SOLN
1250.0000 mg | Freq: Two times a day (BID) | INTRAVENOUS | Status: DC
  Administered 2020-02-17 – 2020-02-18 (×3): 1250 mg via INTRAVENOUS
  Filled 2020-02-16 (×4): qty 250

## 2020-02-16 MED ORDER — MUPIROCIN 2 % EX OINT
1.0000 "application " | TOPICAL_OINTMENT | Freq: Two times a day (BID) | CUTANEOUS | Status: AC
  Administered 2020-02-16 – 2020-02-20 (×10): 1 via NASAL
  Filled 2020-02-16 (×2): qty 22

## 2020-02-16 MED ORDER — VANCOMYCIN HCL 2000 MG/400ML IV SOLN
2000.0000 mg | Freq: Once | INTRAVENOUS | Status: AC
  Administered 2020-02-16: 2000 mg via INTRAVENOUS
  Filled 2020-02-16: qty 400

## 2020-02-16 NOTE — Progress Notes (Addendum)
PHARMACY - PHYSICIAN COMMUNICATION CRITICAL VALUE ALERT - BLOOD CULTURE IDENTIFICATION (BCID)  Shannon Garrett is an 59 y.o. female who presented to [facility] on 02/12/2020 with a chief complaint of Intra-abdominal infection  Assessment: Pancreatitis, ascending cholangitis.  Blood cultures with 2/4 bottles (both anaerobic) with Staph species, not staph aureus.  Name of physician (or Provider) Contacted: Dr. Vaughan Browner  Current antibiotics: Meropenem 1g IV q8h  Changes to prescribed antibiotics recommended:  Patient is on recommended antibiotics - No changes needed  Results for orders placed or performed during the hospital encounter of 02/12/20  Blood Culture ID Panel (Reflexed) (Collected: 02/15/2020  7:51 AM)  Result Value Ref Range   Enterococcus faecalis NOT DETECTED NOT DETECTED   Enterococcus Faecium NOT DETECTED NOT DETECTED   Listeria monocytogenes NOT DETECTED NOT DETECTED   Staphylococcus species DETECTED (A) NOT DETECTED   Staphylococcus aureus (BCID) NOT DETECTED NOT DETECTED   Staphylococcus epidermidis NOT DETECTED NOT DETECTED   Staphylococcus lugdunensis NOT DETECTED NOT DETECTED   Streptococcus species NOT DETECTED NOT DETECTED   Streptococcus agalactiae NOT DETECTED NOT DETECTED   Streptococcus pneumoniae NOT DETECTED NOT DETECTED   Streptococcus pyogenes NOT DETECTED NOT DETECTED   A.calcoaceticus-baumannii NOT DETECTED NOT DETECTED   Bacteroides fragilis NOT DETECTED NOT DETECTED   Enterobacterales NOT DETECTED NOT DETECTED   Enterobacter cloacae complex NOT DETECTED NOT DETECTED   Escherichia coli NOT DETECTED NOT DETECTED   Klebsiella aerogenes NOT DETECTED NOT DETECTED   Klebsiella oxytoca NOT DETECTED NOT DETECTED   Klebsiella pneumoniae NOT DETECTED NOT DETECTED   Proteus species NOT DETECTED NOT DETECTED   Salmonella species NOT DETECTED NOT DETECTED   Serratia marcescens NOT DETECTED NOT DETECTED   Haemophilus influenzae NOT DETECTED NOT DETECTED    Neisseria meningitidis NOT DETECTED NOT DETECTED   Pseudomonas aeruginosa NOT DETECTED NOT DETECTED   Stenotrophomonas maltophilia NOT DETECTED NOT DETECTED   Candida albicans NOT DETECTED NOT DETECTED   Candida auris NOT DETECTED NOT DETECTED   Candida glabrata NOT DETECTED NOT DETECTED   Candida krusei NOT DETECTED NOT DETECTED   Candida parapsilosis NOT DETECTED NOT DETECTED   Candida tropicalis NOT DETECTED NOT DETECTED   Cryptococcus neoformans/gattii NOT DETECTED NOT DETECTED    Gretta Arab PharmD, BCPS Clinical Pharmacist [facility] main pharmacy [phone] 02/16/2020 9:49 AM    Addendum: Per Dr. Tawanna Solo, start Vancomycin per pharmacy due to recent septic shock and await further blood culture results.  Plan: Vancomycin 2000 mg IV x1 then 1250mg  IV q12h. Measure Vanc trough at steady state as needed. Follow up renal function, culture results, and clinical course.  Gretta Arab PharmD, BCPS Clinical Pharmacist [facility] main pharmacy [phone] 02/16/2020 2:14 PM

## 2020-02-16 NOTE — Telephone Encounter (Signed)
The pt has been scheduled for follow up and will be notified at discharge.

## 2020-02-16 NOTE — Progress Notes (Signed)
eLink Physician-Brief Progress Note Patient Name: Shannon Garrett DOB: 1960/11/15 MRN: [id number]   Date of Service  02/16/2020  HPI/Events of Note  Nursing suspects UTI and requests UA.   eICU Interventions  Plan: 1. Clean catch UA with reflex microscopic now.      Intervention Category Major Interventions: Other:  Lysle Dingwall 02/16/2020, 5:40 AM

## 2020-02-16 NOTE — Care Plan (Signed)
Endo RNs at bedside to transport patient. PT sent via bed on ICU monitoring equipment.

## 2020-02-16 NOTE — Transfer of Care (Signed)
Immediate Anesthesia Transfer of Care Note  Patient: Kemora Leonor  Procedure(s) Performed: ENDOSCOPIC RETROGRADE CHOLANGIOPANCREATOGRAPHY (ERCP) (N/A ) SPHINCTEROTOMY REMOVAL OF STONES BILIARY DILATION  Patient Location: PACU  Anesthesia Type:General  Level of Consciousness: drowsy and patient cooperative  Airway & Oxygen Therapy: Patient Spontanous Breathing and Patient connected to face mask oxygen  Post-op Assessment: Report given to RN and Post -op Vital signs reviewed and stable  Post vital signs: Reviewed and stable  Last Vitals:  Vitals Value Taken Time  BP 134/71 02/16/20 0000  Temp    Pulse 92 02/16/20 0007  Resp 20 02/16/20 0007  SpO2 100 % 02/16/20 0007  Vitals shown include unvalidated device data.  Last Pain:  Vitals:   02/15/20 2212  TempSrc: Oral  PainSc: 0-No pain      Patients Stated Pain Goal: 2 (14/99/69 2493)  Complications: No complications documented.

## 2020-02-16 NOTE — Progress Notes (Signed)
Patient ID: Shannon Garrett, female   DOB: [birth year], 59 y.o.   MRN: [id number]    Progress Note   Subjective   day # 4 CC; cholangitis/ CBD stones  ERCP last pm-edematous ulcerated major papilla, dilated biliary tree with multiple stones and sludge, pus swept from the duct.  Successful complete removal of stones with biliary sphincterotomy and balloon sphincteroplasty, and balloon sweep  Central line placed prior to ERCP Meropenem IV Levophed weaned off   [facility] pend Labs pending this a.m.  Patient looking and feeling better, blood pressure stable off Levophed.  Patient denies any abdominal pain and is hungry. Afebrile overnight     Objective   Vital signs in last 24 hours: Temp:  [97.1 F (36.2 C)-102.9 F (39.4 C)] 97.5 F (36.4 C) (08/17 0820) Pulse Rate:  [84-126] 85 (08/17 0800) Resp:  [11-29] 15 (08/17 0800) BP: (69-154)/(35-76) 139/68 (08/17 0800) SpO2:  [95 %-100 %] 100 % (08/17 0800) Arterial Line BP: (104-151)/(43-69) 120/56 (08/17 0800) Weight:  [113.3 kg] 113.3 kg (08/16 1019) Last BM Date: 02/12/20 General older chronically ill-appearing African-American female in NAD Heart:  Regular rate and rhythm; no murmurs Lungs: Respirations even and unlabored, lungs CTA bilaterally Abdomen:  Soft, nontender and nondistended. Normal bowel sounds.  JP in right upper quadrant minimal drainage Extremities:  Without edema. Neurologic:  Alert and oriented,  grossly normal neurologically. Psych:  Cooperative. Normal mood and affect.  Intake/Output from previous day: 08/16 0701 - 08/17 0700 In: 1764.7 [I.V.:1512; IV Piggyback:252.8] Out: 850 [Urine:800; Drains:50] Intake/Output this shift: No intake/output data recorded.  Lab Results: Recent Labs    02/14/20 0550 02/15/20 0505 02/15/20 2115  WBC 8.7 17.2* 22.7*  HGB 9.3* 9.6* 9.2*  HCT 29.1* 28.6* 27.8*  PLT 233 238 257   BMET Recent Labs    02/14/20 0550 02/15/20 0505 02/15/20 2115  NA 141 140 145  K 3.7  3.7 3.6  CL 110 106 110  CO2 24 24 23   GLUCOSE 68* 178* 93  BUN 13 15 19   CREATININE 0.89 1.17* 1.44*  CALCIUM 8.3* 8.3* 8.1*   LFT Recent Labs    02/15/20 0742 02/15/20 0742 02/15/20 2115  PROT 5.9*   < > 5.7*  ALBUMIN 2.8*   < > 2.6*  AST 95*   < > 46*  ALT 118*   < > 86*  ALKPHOS 532*   < > 446*  BILITOT 2.9*   < > 3.4*  BILIDIR 2.3*  --   --   IBILI 0.6  --   --    < > = values in this interval not displayed.   PT/INR Recent Labs    02/15/20 1708  LABPROT 14.0  INR 1.1    Studies/Results: DG Chest 1 View  Result Date: 02/15/2020 CLINICAL DATA:  59 year old female status post central line placement. EXAM: CHEST  1 VIEW COMPARISON:  Chest radiograph dated 02/15/2020. FINDINGS: Interval placement of a left IJ central venous line with tip over central SVC. No pneumothorax. There is shallow inspiration with bibasilar atelectasis. Left mid to lower lung field streaky densities, progressed since the prior radiograph may represent atelectasis although infiltrate is not excluded. Clinical correlation is recommended. No large pleural effusion. Stable mild cardiomegaly. No acute osseous pathology. IMPRESSION: 1. Interval placement of a left IJ central venous line with tip over central SVC. No pneumothorax. 2. Left mid to lower lung field atelectasis versus infiltrate. Electronically Signed   By: Anner Crete M.D.   On: 02/15/2020  21:40   DG Chest 2 View  Result Date: 02/15/2020 CLINICAL DATA:  Fever.  Bilateral upper extremity swelling. EXAM: CHEST - 2 VIEW COMPARISON:  Chest x-ray dated May 10, 2019. FINDINGS: Stable cardiomediastinal silhouette. Normal pulmonary vascularity. Low lung volumes. Trace right pleural effusion. No focal consolidation or pneumothorax. No acute osseous abnormality. IMPRESSION: 1. Trace right pleural effusion. Electronically Signed   By: Titus Dubin M.D.   On: 02/15/2020 08:30   MR 3D Recon At Scanner  Result Date: 02/15/2020 CLINICAL DATA:   59 year old female with history of abdominal pain. Elevated bilirubin level. Status post laparoscopic cholecystectomy. Evaluate for biliary tract obstruction (retained stone in the common bile duct). EXAM: MRI ABDOMEN WITHOUT AND WITH CONTRAST (INCLUDING MRCP) TECHNIQUE: Multiplanar multisequence MR imaging of the abdomen was performed both before and after the administration of intravenous contrast. Heavily T2-weighted images of the biliary and pancreatic ducts were obtained, and three-dimensional MRCP images were rendered by post processing. CONTRAST:  42mL GADAVIST GADOBUTROL 1 MMOL/ML IV SOLN COMPARISON:  No prior abdominal MRI. CT the abdomen and pelvis 06/29/2019. FINDINGS: Lower chest: Dependent areas of signal intensity are noted in the lower lobes of the lungs bilaterally, which may reflect areas of atelectasis or consolidation (poorly evaluated by magnetic resonance). Hepatobiliary: No suspicious cystic or solid hepatic lesions. Status post cholecystectomy. Linear signal void in the gallbladder fossa, presumably a surgical drain. The tip of this drainage catheter extends into a small postoperative fluid collection in the gallbladder fossa which measures approximately 5.4 x 1.6 cm (axial image 26 of series 3). MRCP images demonstrate mild intrahepatic biliary ductal dilatation. There is also marked dilatation of the common bile duct which measures up to 18 mm in diameter in the porta hepatis. There are numerous filling defects within the common bile duct compatible with retained ductal stones. Pancreas: No pancreatic mass. Mild dilatation of the main pancreatic duct (4 mm). Small amount of peripancreatic increased T2 signal intensity compatible with trace amount of peripancreatic fluid/inflammation. No well-defined pancreatic or peripancreatic fluid collections. Spleen:  Unremarkable. Adrenals/Urinary Tract: Numerous subcentimeter T1 hypointense, T2 hyperintense, nonenhancing lesions in both kidneys,  compatible with tiny simple cysts. No suspicious renal lesions. Bilateral adrenal glands are normal in appearance. No hydroureteronephrosis in the visualized portions of the abdomen. Stomach/Bowel: Visualized portions are unremarkable. Vascular/Lymphatic: Aortic atherosclerosis, without evidence of aneurysm in the abdominal vasculature. No lymphadenopathy noted in the abdomen. Other: Small amount of increased T2 signal intensity in the retroperitoneum adjacent to the pancreas. No significant volume of ascites. Musculoskeletal: No aggressive appearing osseous lesions are noted in the visualized portions of the skeleton. IMPRESSION: 1. Status post cholecystectomy with surgical drain in the gallbladder fossa which appears to extend into a small postoperative fluid collection which likely represents a small postoperative seroma. 2. Severely dilated common bile duct with extensive choledocholithiasis filling the common bile duct. This is associated with both mild intrahepatic biliary ductal dilatation, and mild pancreatic ductal dilatation, as well as evidence of acute pancreatitis, as detailed above. 3. Dependent areas of signal intensity in the lower lobes of the lungs bilaterally favored to reflect some postoperative atelectasis, although some degree of airspace consolidation is not entirely excluded. Electronically Signed   By: Vinnie Langton M.D.   On: 02/15/2020 14:59   MR ABDOMEN MRCP W WO CONTAST  Result Date: 02/15/2020 CLINICAL DATA:  59 year old female with history of abdominal pain. Elevated bilirubin level. Status post laparoscopic cholecystectomy. Evaluate for biliary tract obstruction (retained stone in the common bile  duct). EXAM: MRI ABDOMEN WITHOUT AND WITH CONTRAST (INCLUDING MRCP) TECHNIQUE: Multiplanar multisequence MR imaging of the abdomen was performed both before and after the administration of intravenous contrast. Heavily T2-weighted images of the biliary and pancreatic ducts were  obtained, and three-dimensional MRCP images were rendered by post processing. CONTRAST:  2mL GADAVIST GADOBUTROL 1 MMOL/ML IV SOLN COMPARISON:  No prior abdominal MRI. CT the abdomen and pelvis 06/29/2019. FINDINGS: Lower chest: Dependent areas of signal intensity are noted in the lower lobes of the lungs bilaterally, which may reflect areas of atelectasis or consolidation (poorly evaluated by magnetic resonance). Hepatobiliary: No suspicious cystic or solid hepatic lesions. Status post cholecystectomy. Linear signal void in the gallbladder fossa, presumably a surgical drain. The tip of this drainage catheter extends into a small postoperative fluid collection in the gallbladder fossa which measures approximately 5.4 x 1.6 cm (axial image 26 of series 3). MRCP images demonstrate mild intrahepatic biliary ductal dilatation. There is also marked dilatation of the common bile duct which measures up to 18 mm in diameter in the porta hepatis. There are numerous filling defects within the common bile duct compatible with retained ductal stones. Pancreas: No pancreatic mass. Mild dilatation of the main pancreatic duct (4 mm). Small amount of peripancreatic increased T2 signal intensity compatible with trace amount of peripancreatic fluid/inflammation. No well-defined pancreatic or peripancreatic fluid collections. Spleen:  Unremarkable. Adrenals/Urinary Tract: Numerous subcentimeter T1 hypointense, T2 hyperintense, nonenhancing lesions in both kidneys, compatible with tiny simple cysts. No suspicious renal lesions. Bilateral adrenal glands are normal in appearance. No hydroureteronephrosis in the visualized portions of the abdomen. Stomach/Bowel: Visualized portions are unremarkable. Vascular/Lymphatic: Aortic atherosclerosis, without evidence of aneurysm in the abdominal vasculature. No lymphadenopathy noted in the abdomen. Other: Small amount of increased T2 signal intensity in the retroperitoneum adjacent to the  pancreas. No significant volume of ascites. Musculoskeletal: No aggressive appearing osseous lesions are noted in the visualized portions of the skeleton. IMPRESSION: 1. Status post cholecystectomy with surgical drain in the gallbladder fossa which appears to extend into a small postoperative fluid collection which likely represents a small postoperative seroma. 2. Severely dilated common bile duct with extensive choledocholithiasis filling the common bile duct. This is associated with both mild intrahepatic biliary ductal dilatation, and mild pancreatic ductal dilatation, as well as evidence of acute pancreatitis, as detailed above. 3. Dependent areas of signal intensity in the lower lobes of the lungs bilaterally favored to reflect some postoperative atelectasis, although some degree of airspace consolidation is not entirely excluded. Electronically Signed   By: Vinnie Langton M.D.   On: 02/15/2020 14:59       Assessment / Plan:    #11 59 year old African-American female 4 days status post laparoscopic cholecystectomy for chronic cholecystitis and cholelithiasis. Patient developed sepsis yesterday, with fever ,hypotension, abdominal pain and elevated LFTs, all consistent with cholangitis. MRCP yesterday confirmed multiple common bile duct stones and chronic fluid collection in the gallbladder fossa with percutaneous drain within the collection.  Patient overtly septic last evening, required central line placement, pressors and then eventually underwent emergent ERCP with stone extraction with multiple stones.  Duct was cleared, overt purulence from duct.  Patient is stabilized overnight feeling much better today, pressors weaned off. Now on meropenem. Blood cultures pending  Plan; start clear liquids, advance to full liquids at lunchtime. Continue meropenem-await blood cultures Any failure of continued improvement, then needs evacuation/drainage of the gallbladder fossa fluid  collection.   \  Principal Problem:   Cholangitis from CBD  obstruction s/p ERCP 02/15/2020 Active Problems:   Obesity   MS (multiple sclerosis) (HCC)   Chronic diastolic CHF (congestive heart failure) (HCC)   Paraplegia (HCC)   Insulin dependent diabetes mellitus type IA ([place])   Cholecystitis   Choledocholithiasis s/p ERCP 02/15/2020     LOS: 1 day   Yassir Enis EsterwoodPA-C  02/16/2020, 8:44 AM

## 2020-02-16 NOTE — Progress Notes (Addendum)
PCCM progress note  Patient has been weaned quickly off pressors.  She is hemodynamically stable and will be followed by [facility] PCCM will be available as needed.  Discussed with Dr. Tawanna Solo.  Marshell Garfinkel MD [facility] Please see [email or web address] for pager details.  02/16/2020, 2:22 PM

## 2020-02-16 NOTE — Care Plan (Signed)
Pt rec'd from PACU via bed. Connected to ICU monitoring equipment. 2L [place], L Radial A-line and CVL in place. Neo gtt infusing.

## 2020-02-16 NOTE — Care Plan (Signed)
Report received at change of shift from rapid response RN. Pt's BP 80's/40's and dropping. APP Sharlet Salina paged for orders. See mar. Multiple RNs attempted to place second PIV and were unsuccessful. Pt due to go from ERCP but pt unable to transport due to hemodynamic instability. Dr. Rush Landmark at bedside to assess pt and consult placed to CCM, with orders for peripheral neo. Dr. Loanne Drilling at bedside shortly after consult (approx 2015) to place TLC IJ. Placed without incident. CXR confirmed placement, verbal order from Dr. Loanne Drilling ok to use TLC.

## 2020-02-16 NOTE — Telephone Encounter (Signed)
-----   Message from Irving Copas., MD sent at 02/16/2020  4:56 PM EDT ----- Regarding: Follow-up Shannon Garrett,This patient remains hospitalized at this time.She was discharged at some point in the next week.That set up a follow-up in clinic in approximately 6 weeks.Thanks.GM

## 2020-02-16 NOTE — Progress Notes (Signed)
PROGRESS NOTE    Shannon Garrett  JQB:[id number] DOB: 1960-09-07 DOA: 02/12/2020 PCP: Shannon Cranker, DO   Brief Narrative:  Shannon Garrett is an 59 y.o. female with past medical history systolic congestive heart failure, depression, HTN , COVID-19 infection in 05/2019 , hypertension, multiple sclerosis who  was admitted here under general surgery service  for lap cholecystectomy.  She was admitted here in January this year with cholecystitis.  At the time cholecystectomy was not possible and she underwent percutaneous cholecystostomy tube placement.  She underwent lap chole with drain placement on 02/12/2020 and was planned for skilled nursing facility placement but she developed fever, tachycardia and hypotension overnight with elevated white cell counts, elevated lipase, elevated LFTs.  Sepsis secondary to possible cholangitis was suspected.  Started on broad-spectrum antibiotics, blood culture sent.  She underwent urgent ERCP by GI on 02/15/2020. Post procedure, she became hypotensive and had to be transferred to ICU and was started on Levophed. This morning she is off pressors and her blood pressure is stable.   Assessment & Plan:   Principal Problem:   Cholangitis from CBD obstruction s/p ERCP 02/15/2020 Active Problems:   Obesity   MS (multiple sclerosis) (HCC)   Chronic diastolic CHF (congestive heart failure) (HCC)   Paraplegia (HCC)   Insulin dependent diabetes mellitus type IA ([facility])   Cholecystitis   Choledocholithiasis s/p ERCP 02/15/2020  Septic shock/cholangitis/CBD stones:Status post lap cholecystectomy with drain placment.  Met septic shock criteria with  fever, hypotension, tachycardia, leukocytosis .  Elevated lipase and liver enzymes. MRCP showed severely dilated common bile duct with extensive choledocholithiasis filling the common bile duct,associated mild intrahepatic biliary ductal dilatation, and mild pancreatic ductal dilatation, as well as evidence of acute pancreatitis.   GI following. ERCP done on 02/17/2020 showed edematous ulcerated major papula, dilated biliary tree with multiple stones and sludge. Successful complete removal of the stones with biliary sphincterectomy and balloon sphincteroplasty. Patient was septic on last admission when she was admitted for cholecystitis, her bile cultures at that time showed ESBL E. coli and Enterococcus and she was treated with antibiotics.  She was discharged on cholecystostomy drain. Started on full lliquid. Blood pressure stable today. Blood cultures showed gram-positive cocci only in anaerobic bottles. Suspect contamination but will start on vancomycin and continue meropenem for now. Continuing monitoring CBC,CMP  Acute hypoxic respiratory failure: Currently on 2 L of oxygen per minute.  Chest x-ray showed trace right pleural effusion, continue to attempt to wean the oxygen.This is most likely from atelectasis from recent surgery.  History of chronic systolic congestive heart failure: Echo done on 1/21 showed EF of 40 to 45%, global hypokinesis, moderately elevated pulmonary artery pressure.  She is on spironolactone  at home.  She became  hypotensive and started on  IV fluids. Cardiac cath done on 4/21 showed nonischemic cardiomyopathy, angiographically normal coronary arteries. BP meds  have been held due to hypotension.  Insulin-dependent diabetes: Monitor blood sugars.  Currently on sliding scale insulin.  Last hemoglobin A1C was 7.6.  Morbid obesity: BMI of 42.8.  Elevated troponin: Most likely this is from sepsis.  No further work-up necessary.  Normocytic anemia: Currently hemoglobin stable.  Continue to monitor  Debility/deconditioning/multiple sclerosis: Patient has history of multiple sclerosis and paraplegic, bed bound. Plan for skilled nursing facility placement, she is long-term nursing home resident.  On amantadine, baclofen  Depression: On bupropion, duloxetine  Thank you for this  consultation.  Our [facility] hospitalist team will follow the patient with you.  DVT prophylaxis:lovenox Code Status: DNR Family Communication: None present at the bedside Status is: Inpatient   Procedures: Lap cholecystectomy, ERCP  Antimicrobials:  Anti-infectives (From admission, onward)   Start     Dose/Rate Route Frequency Ordered Stop   02/15/20 1900  meropenem (MERREM) 1 g in sodium chloride 0.9 % 100 mL IVPB     Discontinue     1 g 200 mL/hr over 30 Minutes Intravenous Every 8 hours 02/15/20 1803     02/15/20 1800  piperacillin-tazobactam (ZOSYN) IVPB 3.375 g  Status:  Discontinued        3.375 g 12.5 mL/hr over 240 Minutes Intravenous Every 6 hours 02/15/20 1701 02/15/20 1803   02/15/20 1700  meropenem (MERREM) 500 mg in sodium chloride 0.9 % 100 mL IVPB  Status:  Discontinued        500 mg 200 mL/hr over 30 Minutes Intravenous Every 8 hours 02/15/20 1656 02/15/20 1701   02/15/20 1000  piperacillin-tazobactam (ZOSYN) IVPB 3.375 g  Status:  Discontinued        3.375 g 12.5 mL/hr over 240 Minutes Intravenous Every 8 hours 02/15/20 0932 02/15/20 1656   02/12/20 1500  metroNIDAZOLE (FLAGYL) IVPB 500 mg        500 mg 100 mL/hr over 60 Minutes Intravenous Every 8 hours 02/12/20 1433 02/12/20 1701   02/12/20 1500  ciprofloxacin (CIPRO) IVPB 400 mg        400 mg 200 mL/hr over 60 Minutes Intravenous Every 12 hours 02/12/20 1433 02/12/20 1655   02/12/20 0600  vancomycin (VANCOREADY) IVPB 1500 mg/300 mL        1,500 mg 150 mL/hr over 120 Minutes Intravenous On call to O.R. 02/11/20 1610 02/12/20 1035      Subjective:  Patient seen and examined the bedside this morning. Looks much better today. Hemodynamically stable. Afebrile and blood pressure stable. Denies any abdomen pain and wants to eat something. Objective: Vitals:   02/16/20 1000 02/16/20 1100 02/16/20 1200 02/16/20 1215  BP: 126/60 (!) 159/107    Pulse: 87  87   Resp: 13 (!) 21 11   Temp:    98.3 F (36.8  C)  TempSrc:    Oral  SpO2: 100%  99%   Weight:      Height:        Intake/Output Summary (Last 24 hours) at 02/16/2020 1255 Last data filed at 02/16/2020 0600 Gross per 24 hour  Intake 1764.72 ml  Output 720 ml  Net 1044.72 ml   Filed Weights   02/08/20 1205 02/12/20 0715 02/15/20 1019  Weight: 113.4 kg 113.4 kg 113.3 kg    Examination:  General exam: weak, lying in the bed, chronically looking, obese Respiratory system: Bilateral equal air entry, normal vesicular breath sounds, no wheezes or crackles  Cardiovascular system: S1 & S2 heard, RRR. No JVD, murmurs, rubs, gallops or clicks. No pedal edema. Gastrointestinal system: Abdomen is distended, soft and nontender. No organomegaly or masses felt. Normal bowel sounds heard. Central nervous system: Alert and oriented. Paraplegia Extremities: Trace bilateral lower extremity edema, no clubbing ,no cyanosis Skin: No rashes, lesions or ulcers,no icterus ,no pallor   Data Reviewed: I have personally reviewed following labs and imaging studies  CBC: Recent Labs  Lab 02/12/20 0800 02/12/20 0800 02/13/20 0443 02/14/20 0550 02/15/20 0505 02/15/20 2115 02/16/20 1018  WBC 5.9   < > 11.9* 8.7 17.2* 22.7* 16.2*  NEUTROABS 3.3  --   --   --   --   --   --  HGB 12.4   < > 10.6* 9.3* 9.6* 9.2* 8.4*  HCT 37.2   < > 32.5* 29.1* 28.6* 27.8* 25.3*  MCV 86.5   < > 87.4 88.7 86.1 86.6 86.6  PLT 313   < > 313 233 238 257 204   < > = values in this interval not displayed.   Basic Metabolic Panel: Recent Labs  Lab 02/13/20 0443 02/14/20 0550 02/15/20 0505 02/15/20 2115 02/16/20 1018  NA 135 141 140 145 144  K 3.8 3.7 3.7 3.6 3.8  CL 104 110 106 110 110  CO2 '24 24 24 23 '$ 21*  GLUCOSE 122* 68* 178* 93 102*  BUN '12 13 15 19 '$ 23*  CREATININE 0.96 0.89 1.17* 1.44* 1.31*  CALCIUM 8.2* 8.3* 8.3* 8.1* 7.7*   GFR: Estimated Creatinine Clearance: 58.4 mL/min (A) (by C-G formula based on SCr of 1.31 mg/dL (H)). Liver Function  Tests: Recent Labs  Lab 02/13/20 0443 02/14/20 0550 02/15/20 0742 02/15/20 2115 02/16/20 1018  AST 45* 23 95* 46* 30  ALT 54* 42 118* 86* 71*  ALKPHOS 195* 189* 532* 446* 391*  BILITOT 0.6 0.7 2.9* 3.4* 2.9*  PROT 6.0* 5.6* 5.9* 5.7* 5.6*  ALBUMIN 3.0* 2.7* 2.8* 2.6* 2.8*   Recent Labs  Lab 02/15/20 0742 02/16/20 1018  LIPASE 1,808* 308*   No results for input(s): AMMONIA in the last 168 hours. Coagulation Profile: Recent Labs  Lab 02/15/20 1708  INR 1.1   Cardiac Enzymes: No results for input(s): CKTOTAL, CKMB, CKMBINDEX, TROPONINI in the last 168 hours. BNP (last 3 results) No results for input(s): PROBNP in the last 8760 hours. HbA1C: No results for input(s): HGBA1C in the last 72 hours. CBG: Recent Labs  Lab 02/16/20 0001 02/16/20 0053 02/16/20 0331 02/16/20 0736 02/16/20 1129  GLUCAP 131* 141* 126* 126* 114*   Lipid Profile: No results for input(s): CHOL, HDL, LDLCALC, TRIG, CHOLHDL, LDLDIRECT in the last 72 hours. Thyroid Function Tests: No results for input(s): TSH, T4TOTAL, FREET4, T3FREE, THYROIDAB in the last 72 hours. Anemia Panel: No results for input(s): VITAMINB12, FOLATE, FERRITIN, TIBC, IRON, RETICCTPCT in the last 72 hours. Sepsis Labs: Recent Labs  Lab 02/15/20 2115  LATICACIDVEN 0.9    Recent Results (from the past 240 hour(s))  SARS Coronavirus 2 by RT PCR (hospital order, performed in [facility] hospital lab) Nasopharyngeal Nasopharyngeal Swab     Status: None   Collection Time: 02/12/20  7:14 AM   Specimen: Nasopharyngeal Swab  Result Value Ref Range Status   SARS Coronavirus 2 NEGATIVE NEGATIVE Final    Comment: (NOTE) SARS-CoV-2 target nucleic acids are NOT DETECTED.  The SARS-CoV-2 RNA is generally detectable in upper and lower respiratory specimens during the acute phase of infection. The lowest concentration of SARS-CoV-2 viral copies this assay can detect is 250 copies / mL. A negative result does not preclude  SARS-CoV-2 infection and should not be used as the sole basis for treatment or other patient management decisions.  A negative result may occur with improper specimen collection / handling, submission of specimen other than nasopharyngeal swab, presence of viral mutation(s) within the areas targeted by this assay, and inadequate number of viral copies (<250 copies / mL). A negative result must be combined with clinical observations, patient history, and epidemiological information.  Fact Sheet for Patients:   StrictlyIdeas.no  Fact Sheet for Healthcare Providers: BankingDealers.co.za  This test is not yet approved or  cleared by the [place] FDA and has been authorized for detection  and/or diagnosis of SARS-CoV-2 by FDA under an Emergency Use Authorization (EUA).  This EUA will remain in effect (meaning this test can be used) for the duration of the COVID-19 declaration under Section 564(b)(1) of the Act, 21 U.S.C. section 360bbb-3(b)(1), unless the authorization is terminated or revoked sooner.  Performed at [facility], [place] [address] [place], [place] [address]   Culture, blood (routine x 2)     Status: None (Preliminary result)   Collection Time: 02/15/20  7:42 AM   Specimen: BLOOD  Result Value Ref Range Status   Specimen Description   Final    BLOOD RIGHT ARM Performed at [facility] [address], [place], [place] [address]    Special Requests   Final    BOTTLES DRAWN AEROBIC AND ANAEROBIC Blood Culture adequate volume Performed at [facility] [address] [place], [place] [address]    Culture  Setup Time   Final    GRAM POSITIVE COCCI ANAEROBIC BOTTLE ONLY CRITICAL VALUE NOTED.  VALUE IS CONSISTENT WITH PREVIOUSLY REPORTED AND CALLED VALUE. Performed at [facility], [place] [address] [place], [place] [address]    Culture GRAM POSITIVE COCCI   Final   Report Status PENDING  Incomplete  Culture, blood (routine x 2)     Status: None (Preliminary result)   Collection Time: 02/15/20  7:51 AM   Specimen: BLOOD  Result Value Ref Range Status   Specimen Description   Final    BLOOD RIGHT ARM LOWER Performed at [facility], [place] [address] [place], [place] [address]    Special Requests   Final    BOTTLES DRAWN AEROBIC AND ANAEROBIC Blood Culture adequate volume Performed at [facility] [address], [place], [place] [address]    Culture  Setup Time   Final    GRAM POSITIVE COCCI IN CLUSTERS ANAEROBIC BOTTLE ONLY CRITICAL RESULT CALLED TO, READ BACK BY AND VERIFIED WITH: PHARMD J. GADHIA 02/16/20 0918 FCP Performed at [facility], [place] [address] [place],  [address]    Culture GRAM POSITIVE COCCI  Final   Report Status PENDING  Incomplete  Blood Culture ID Panel (Reflexed)     Status: Abnormal   Collection Time: 02/15/20  7:51 AM  Result Value Ref Range Status   Enterococcus faecalis NOT DETECTED NOT DETECTED Final   Enterococcus Faecium NOT DETECTED NOT DETECTED Final   Listeria monocytogenes NOT DETECTED NOT DETECTED Final   Staphylococcus species DETECTED (A) NOT DETECTED Final    Comment: CRITICAL RESULT CALLED TO, READ BACK BY AND VERIFIED WITH: PHARMD J. GADHIA 02/16/20 0918 FCP    Staphylococcus aureus (BCID) NOT DETECTED NOT DETECTED Final   Staphylococcus epidermidis NOT DETECTED NOT DETECTED Final   Staphylococcus lugdunensis NOT DETECTED NOT DETECTED Final   Streptococcus species NOT DETECTED NOT DETECTED Final   Streptococcus agalactiae NOT DETECTED NOT DETECTED Final   Streptococcus pneumoniae NOT DETECTED NOT DETECTED Final   Streptococcus pyogenes NOT DETECTED NOT DETECTED Final   A.calcoaceticus-baumannii NOT DETECTED NOT DETECTED Final   Bacteroides fragilis NOT DETECTED NOT DETECTED Final   Enterobacterales NOT DETECTED NOT DETECTED Final    Enterobacter cloacae complex NOT DETECTED NOT DETECTED Final   Escherichia coli NOT DETECTED NOT DETECTED Final   Klebsiella aerogenes NOT DETECTED NOT DETECTED Final   Klebsiella oxytoca NOT DETECTED NOT DETECTED Final   Klebsiella pneumoniae NOT DETECTED NOT DETECTED Final   Proteus species NOT  DETECTED NOT DETECTED Final   Salmonella species NOT DETECTED NOT DETECTED Final   Serratia marcescens NOT DETECTED NOT DETECTED Final   Haemophilus influenzae NOT DETECTED NOT DETECTED Final   Neisseria meningitidis NOT DETECTED NOT DETECTED Final   Pseudomonas aeruginosa NOT DETECTED NOT DETECTED Final   Stenotrophomonas maltophilia NOT DETECTED NOT DETECTED Final   Candida albicans NOT DETECTED NOT DETECTED Final   Candida auris NOT DETECTED NOT DETECTED Final   Candida glabrata NOT DETECTED NOT DETECTED Final   Candida krusei NOT DETECTED NOT DETECTED Final   Candida parapsilosis NOT DETECTED NOT DETECTED Final   Candida tropicalis NOT DETECTED NOT DETECTED Final   Cryptococcus neoformans/gattii NOT DETECTED NOT DETECTED Final    Comment: Performed at [facility], [place] [address] [place], [place] [address]  MRSA PCR Screening     Status: Abnormal   Collection Time: 02/15/20  7:06 PM   Specimen: Nasal Mucosa; Nasopharyngeal  Result Value Ref Range Status   MRSA by PCR POSITIVE (A) NEGATIVE Final    Comment:        The GeneXpert MRSA Assay (FDA approved for NASAL specimens only), is one component of a comprehensive MRSA colonization surveillance program. It is not intended to diagnose MRSA infection nor to guide or monitor treatment for MRSA infections. RESULT CALLED TO, READ BACK BY AND VERIFIED WITH: RN B BROOKS AT 2317 02/15/20 CRUICKSHANK A Performed at [facility], [place] [address], [place], [place] [address]          Radiology Studies: DG Chest 1 View  Result Date: 02/15/2020 CLINICAL DATA:  59 year old female status post central line placement.  EXAM: CHEST  1 VIEW COMPARISON:  Chest radiograph dated 02/15/2020. FINDINGS: Interval placement of a left IJ central venous line with tip over central SVC. No pneumothorax. There is shallow inspiration with bibasilar atelectasis. Left mid to lower lung field streaky densities, progressed since the prior radiograph may represent atelectasis although infiltrate is not excluded. Clinical correlation is recommended. No large pleural effusion. Stable mild cardiomegaly. No acute osseous pathology. IMPRESSION: 1. Interval placement of a left IJ central venous line with tip over central SVC. No pneumothorax. 2. Left mid to lower lung field atelectasis versus infiltrate. Electronically Signed   By: Anner Crete M.D.   On: 02/15/2020 21:40   DG Chest 2 View  Result Date: 02/15/2020 CLINICAL DATA:  Fever.  Bilateral upper extremity swelling. EXAM: CHEST - 2 VIEW COMPARISON:  Chest x-ray dated May 10, 2019. FINDINGS: Stable cardiomediastinal silhouette. Normal pulmonary vascularity. Low lung volumes. Trace right pleural effusion. No focal consolidation or pneumothorax. No acute osseous abnormality. IMPRESSION: 1. Trace right pleural effusion. Electronically Signed   By: Titus Dubin M.D.   On: 02/15/2020 08:30   MR 3D Recon At Scanner  Result Date: 02/15/2020 CLINICAL DATA:  59 year old female with history of abdominal pain. Elevated bilirubin level. Status post laparoscopic cholecystectomy. Evaluate for biliary tract obstruction (retained stone in the common bile duct). EXAM: MRI ABDOMEN WITHOUT AND WITH CONTRAST (INCLUDING MRCP) TECHNIQUE: Multiplanar multisequence MR imaging of the abdomen was performed both before and after the administration of intravenous contrast. Heavily T2-weighted images of the biliary and pancreatic ducts were obtained, and three-dimensional MRCP images were rendered by post processing. CONTRAST:  45m GADAVIST GADOBUTROL 1 MMOL/ML IV SOLN COMPARISON:  No prior abdominal MRI. CT  the abdomen and pelvis 06/29/2019. FINDINGS: Lower chest: Dependent areas of signal intensity are noted in the lower lobes of the lungs  bilaterally, which may reflect areas of atelectasis or consolidation (poorly evaluated by magnetic resonance). Hepatobiliary: No suspicious cystic or solid hepatic lesions. Status post cholecystectomy. Linear signal void in the gallbladder fossa, presumably a surgical drain. The tip of this drainage catheter extends into a small postoperative fluid collection in the gallbladder fossa which measures approximately 5.4 x 1.6 cm (axial image 26 of series 3). MRCP images demonstrate mild intrahepatic biliary ductal dilatation. There is also marked dilatation of the common bile duct which measures up to 18 mm in diameter in the porta hepatis. There are numerous filling defects within the common bile duct compatible with retained ductal stones. Pancreas: No pancreatic mass. Mild dilatation of the main pancreatic duct (4 mm). Small amount of peripancreatic increased T2 signal intensity compatible with trace amount of peripancreatic fluid/inflammation. No well-defined pancreatic or peripancreatic fluid collections. Spleen:  Unremarkable. Adrenals/Urinary Tract: Numerous subcentimeter T1 hypointense, T2 hyperintense, nonenhancing lesions in both kidneys, compatible with tiny simple cysts. No suspicious renal lesions. Bilateral adrenal glands are normal in appearance. No hydroureteronephrosis in the visualized portions of the abdomen. Stomach/Bowel: Visualized portions are unremarkable. Vascular/Lymphatic: Aortic atherosclerosis, without evidence of aneurysm in the abdominal vasculature. No lymphadenopathy noted in the abdomen. Other: Small amount of increased T2 signal intensity in the retroperitoneum adjacent to the pancreas. No significant volume of ascites. Musculoskeletal: No aggressive appearing osseous lesions are noted in the visualized portions of the skeleton. IMPRESSION: 1. Status  post cholecystectomy with surgical drain in the gallbladder fossa which appears to extend into a small postoperative fluid collection which likely represents a small postoperative seroma. 2. Severely dilated common bile duct with extensive choledocholithiasis filling the common bile duct. This is associated with both mild intrahepatic biliary ductal dilatation, and mild pancreatic ductal dilatation, as well as evidence of acute pancreatitis, as detailed above. 3. Dependent areas of signal intensity in the lower lobes of the lungs bilaterally favored to reflect some postoperative atelectasis, although some degree of airspace consolidation is not entirely excluded. Electronically Signed   By: Vinnie Langton M.D.   On: 02/15/2020 14:59   DG ERCP BILIARY & PANCREATIC DUCTS  Result Date: 02/16/2020 CLINICAL DATA:  Abdominal pain, elevated bilirubin, post cholecystectomy, choledocholithiasis on MRCP EXAM: ERCP TECHNIQUE: Multiple spot images obtained with the fluoroscopic device and submitted for interpretation post-procedure. COMPARISON:  MR CP from the same day FINDINGS: Series of fluoroscopic images document endoscopic cannulation and opacification of the CBD. Filling defects are noted in the distal CBD. Subsequent balloon catheter placement and balloon sweeps documented. There is incomplete opacification of the intrahepatic biliary tree which appears decompressed centrally. Cholecystectomy clips are noted. No evidence of extravasation. IMPRESSION: Endoscopic CBD cannulation and intervention as above. These images were submitted for radiologic interpretation only. Please see the procedural report for the amount of contrast and the fluoroscopy time utilized. Electronically Signed   By: Lucrezia Europe M.D.   On: 02/16/2020 11:08   MR ABDOMEN MRCP W WO CONTAST  Result Date: 02/15/2020 CLINICAL DATA:  59 year old female with history of abdominal pain. Elevated bilirubin level. Status post laparoscopic  cholecystectomy. Evaluate for biliary tract obstruction (retained stone in the common bile duct). EXAM: MRI ABDOMEN WITHOUT AND WITH CONTRAST (INCLUDING MRCP) TECHNIQUE: Multiplanar multisequence MR imaging of the abdomen was performed both before and after the administration of intravenous contrast. Heavily T2-weighted images of the biliary and pancreatic ducts were obtained, and three-dimensional MRCP images were rendered by post processing. CONTRAST:  75m GADAVIST GADOBUTROL 1 MMOL/ML IV SOLN COMPARISON:  No prior abdominal MRI. CT the abdomen and pelvis 06/29/2019. FINDINGS: Lower chest: Dependent areas of signal intensity are noted in the lower lobes of the lungs bilaterally, which may reflect areas of atelectasis or consolidation (poorly evaluated by magnetic resonance). Hepatobiliary: No suspicious cystic or solid hepatic lesions. Status post cholecystectomy. Linear signal void in the gallbladder fossa, presumably a surgical drain. The tip of this drainage catheter extends into a small postoperative fluid collection in the gallbladder fossa which measures approximately 5.4 x 1.6 cm (axial image 26 of series 3). MRCP images demonstrate mild intrahepatic biliary ductal dilatation. There is also marked dilatation of the common bile duct which measures up to 18 mm in diameter in the porta hepatis. There are numerous filling defects within the common bile duct compatible with retained ductal stones. Pancreas: No pancreatic mass. Mild dilatation of the main pancreatic duct (4 mm). Small amount of peripancreatic increased T2 signal intensity compatible with trace amount of peripancreatic fluid/inflammation. No well-defined pancreatic or peripancreatic fluid collections. Spleen:  Unremarkable. Adrenals/Urinary Tract: Numerous subcentimeter T1 hypointense, T2 hyperintense, nonenhancing lesions in both kidneys, compatible with tiny simple cysts. No suspicious renal lesions. Bilateral adrenal glands are normal in  appearance. No hydroureteronephrosis in the visualized portions of the abdomen. Stomach/Bowel: Visualized portions are unremarkable. Vascular/Lymphatic: Aortic atherosclerosis, without evidence of aneurysm in the abdominal vasculature. No lymphadenopathy noted in the abdomen. Other: Small amount of increased T2 signal intensity in the retroperitoneum adjacent to the pancreas. No significant volume of ascites. Musculoskeletal: No aggressive appearing osseous lesions are noted in the visualized portions of the skeleton. IMPRESSION: 1. Status post cholecystectomy with surgical drain in the gallbladder fossa which appears to extend into a small postoperative fluid collection which likely represents a small postoperative seroma. 2. Severely dilated common bile duct with extensive choledocholithiasis filling the common bile duct. This is associated with both mild intrahepatic biliary ductal dilatation, and mild pancreatic ductal dilatation, as well as evidence of acute pancreatitis, as detailed above. 3. Dependent areas of signal intensity in the lower lobes of the lungs bilaterally favored to reflect some postoperative atelectasis, although some degree of airspace consolidation is not entirely excluded. Electronically Signed   By: Vinnie Langton M.D.   On: 02/15/2020 14:59        Scheduled Meds: . acetaminophen  1,000 mg Oral Q6H  . amantadine  100 mg Oral Daily  . baclofen  5 mg Oral QID  . buPROPion  150 mg Oral Daily  . Chlorhexidine Gluconate Cloth  6 each Topical Q0600  . docusate sodium  100 mg Oral BID  . DULoxetine  60 mg Oral Daily  . enoxaparin (LOVENOX) injection  40 mg Subcutaneous Q24H  . famotidine  20 mg Oral BID  . insulin aspart  0-15 Units Subcutaneous TID WC  . lubiprostone  24 mcg Oral BID WC  . mouth rinse  15 mL Mouth Rinse BID  . melatonin  10 mg Oral QHS  . mupirocin ointment  1 application Nasal BID  . pantoprazole  40 mg Oral Daily   Continuous Infusions: . sodium  chloride 75 mL/hr at 02/16/20 0758  . sodium chloride    . lactated ringers    . meropenem (MERREM) IV Stopped (02/16/20 0539)  . norepinephrine (LEVOPHED) Adult infusion    . phenylephrine (NEO-SYNEPHRINE) Adult infusion 40 mcg/min (02/15/20 2351)     LOS: 1 day    Time spent: 55 mins.More than 50% of that time was spent in counseling and/or coordination of  care.      Shelly Coss, MD [facility] P8/17/2021, 12:55 PM

## 2020-02-16 NOTE — Anesthesia Postprocedure Evaluation (Signed)
Anesthesia Post Note  Patient: Shannon Garrett  Procedure(s) Performed: ENDOSCOPIC RETROGRADE CHOLANGIOPANCREATOGRAPHY (ERCP) (N/A ) SPHINCTEROTOMY REMOVAL OF STONES BILIARY DILATION     Patient location during evaluation: PACU Anesthesia Type: General Level of consciousness: awake and alert Pain management: pain level controlled Vital Signs Assessment: post-procedure vital signs reviewed and stable Respiratory status: spontaneous breathing, nonlabored ventilation, respiratory function stable and patient connected to nasal cannula oxygen Cardiovascular status: blood pressure returned to baseline and stable Postop Assessment: no apparent nausea or vomiting Anesthetic complications: no   No complications documented.  Last Vitals:  Vitals:   02/15/20 2200 02/15/20 2212  BP: 119/70 (!) 118/59  Pulse: 94 96  Resp: (!) 24 (!) 26  Temp:  36.6 C  SpO2: 100% 95%    Last Pain:  Vitals:   02/15/20 2212  TempSrc: Oral  PainSc: 0-No pain                 Ambra Haverstick,W. EDMOND

## 2020-02-16 NOTE — Progress Notes (Addendum)
1 Day Post-Op  Subjective: CC: Events noted overnight. Transferred to ICU after developing worsening shock and require pressure support. Underwent ERCP yesterday by GI. HR and BP have improved this AM.   She reports abdominal pain has improved. She points to her suprapubic abdomen as her only area of pain. Notes dysuria. Has had a few sips of water this AM and tolerating without n/v or increased abdominal pain.   Objective: Vital signs in last 24 hours: Temp:  [97.1 F (36.2 C)-102.9 F (39.4 C)] 97.5 F (36.4 C) (08/17 0820) Pulse Rate:  [84-126] 85 (08/17 0800) Resp:  [11-29] 15 (08/17 0800) BP: (69-154)/(35-76) 139/68 (08/17 0800) SpO2:  [95 %-100 %] 100 % (08/17 0800) Arterial Line BP: (104-151)/(43-69) 120/56 (08/17 0800) Weight:  [113.3 kg] 113.3 kg (08/16 1019) Last BM Date: 02/12/20  Intake/Output from previous day: 08/16 0701 - 08/17 0700 In: 1764.7 [I.V.:1512; IV Piggyback:252.8] Out: 850 [Urine:800; Drains:50] Intake/Output this shift: No intake/output data recorded.  PE: Gen:  Alert, NAD, pleasant Card:  RRR Pulm:  CTA to upper lung fields. Distant at bases. Normal rate and effort. On o2.  Abd: Soft, RUQ and epigastric tenderness. No peritonitis. +BS. Incisions with glue intact appears well and are without drainage, bleeding, or signs of infection. JP drain w/ brown tinged serous (surgicel) with some more opaque tan material in the tubing. Ext:  No LE edema or calf tenderness. Chronic LE contractures. SCDs in place  Skin: no rashes noted, warm and dry  Lab Results:  Recent Labs    02/15/20 0505 02/15/20 2115  WBC 17.2* 22.7*  HGB 9.6* 9.2*  HCT 28.6* 27.8*  PLT 238 257   BMET Recent Labs    02/15/20 0505 02/15/20 2115  NA 140 145  K 3.7 3.6  CL 106 110  CO2 24 23  GLUCOSE 178* 93  BUN 15 19  CREATININE 1.17* 1.44*  CALCIUM 8.3* 8.1*   PT/INR Recent Labs    02/15/20 1708  LABPROT 14.0  INR 1.1   CMP     Component Value Date/Time    NA 145 02/15/2020 2115   NA 144 10/26/2019 1100   K 3.6 02/15/2020 2115   CL 110 02/15/2020 2115   CO2 23 02/15/2020 2115   GLUCOSE 93 02/15/2020 2115   BUN 19 02/15/2020 2115   BUN 21 10/26/2019 1100   CREATININE 1.44 (H) 02/15/2020 2115   CALCIUM 8.1 (L) 02/15/2020 2115   PROT 5.7 (L) 02/15/2020 2115   PROT 6.4 08/26/2018 0959   ALBUMIN 2.6 (L) 02/15/2020 2115   ALBUMIN 3.9 08/26/2018 0959   AST 46 (H) 02/15/2020 2115   ALT 86 (H) 02/15/2020 2115   ALKPHOS 446 (H) 02/15/2020 2115   BILITOT 3.4 (H) 02/15/2020 2115   BILITOT 0.2 08/26/2018 0959   GFRNONAA 40 (L) 02/15/2020 2115   GFRAA 47 (L) 02/15/2020 2115   Lipase     Component Value Date/Time   LIPASE 1,808 (H) 02/15/2020 0742       Studies/Results: DG Chest 1 View  Result Date: 02/15/2020 CLINICAL DATA:  59 year old female status post central line placement. EXAM: CHEST  1 VIEW COMPARISON:  Chest radiograph dated 02/15/2020. FINDINGS: Interval placement of a left IJ central venous line with tip over central SVC. No pneumothorax. There is shallow inspiration with bibasilar atelectasis. Left mid to lower lung field streaky densities, progressed since the prior radiograph may represent atelectasis although infiltrate is not excluded. Clinical correlation is recommended. No large pleural effusion.  Stable mild cardiomegaly. No acute osseous pathology. IMPRESSION: 1. Interval placement of a left IJ central venous line with tip over central SVC. No pneumothorax. 2. Left mid to lower lung field atelectasis versus infiltrate. Electronically Signed   By: Anner Crete M.D.   On: 02/15/2020 21:40   DG Chest 2 View  Result Date: 02/15/2020 CLINICAL DATA:  Fever.  Bilateral upper extremity swelling. EXAM: CHEST - 2 VIEW COMPARISON:  Chest x-ray dated May 10, 2019. FINDINGS: Stable cardiomediastinal silhouette. Normal pulmonary vascularity. Low lung volumes. Trace right pleural effusion. No focal consolidation or pneumothorax. No  acute osseous abnormality. IMPRESSION: 1. Trace right pleural effusion. Electronically Signed   By: Titus Dubin M.D.   On: 02/15/2020 08:30   MR 3D Recon At Scanner  Result Date: 02/15/2020 CLINICAL DATA:  59 year old female with history of abdominal pain. Elevated bilirubin level. Status post laparoscopic cholecystectomy. Evaluate for biliary tract obstruction (retained stone in the common bile duct). EXAM: MRI ABDOMEN WITHOUT AND WITH CONTRAST (INCLUDING MRCP) TECHNIQUE: Multiplanar multisequence MR imaging of the abdomen was performed both before and after the administration of intravenous contrast. Heavily T2-weighted images of the biliary and pancreatic ducts were obtained, and three-dimensional MRCP images were rendered by post processing. CONTRAST:  54mL GADAVIST GADOBUTROL 1 MMOL/ML IV SOLN COMPARISON:  No prior abdominal MRI. CT the abdomen and pelvis 06/29/2019. FINDINGS: Lower chest: Dependent areas of signal intensity are noted in the lower lobes of the lungs bilaterally, which may reflect areas of atelectasis or consolidation (poorly evaluated by magnetic resonance). Hepatobiliary: No suspicious cystic or solid hepatic lesions. Status post cholecystectomy. Linear signal void in the gallbladder fossa, presumably a surgical drain. The tip of this drainage catheter extends into a small postoperative fluid collection in the gallbladder fossa which measures approximately 5.4 x 1.6 cm (axial image 26 of series 3). MRCP images demonstrate mild intrahepatic biliary ductal dilatation. There is also marked dilatation of the common bile duct which measures up to 18 mm in diameter in the porta hepatis. There are numerous filling defects within the common bile duct compatible with retained ductal stones. Pancreas: No pancreatic mass. Mild dilatation of the main pancreatic duct (4 mm). Small amount of peripancreatic increased T2 signal intensity compatible with trace amount of peripancreatic  fluid/inflammation. No well-defined pancreatic or peripancreatic fluid collections. Spleen:  Unremarkable. Adrenals/Urinary Tract: Numerous subcentimeter T1 hypointense, T2 hyperintense, nonenhancing lesions in both kidneys, compatible with tiny simple cysts. No suspicious renal lesions. Bilateral adrenal glands are normal in appearance. No hydroureteronephrosis in the visualized portions of the abdomen. Stomach/Bowel: Visualized portions are unremarkable. Vascular/Lymphatic: Aortic atherosclerosis, without evidence of aneurysm in the abdominal vasculature. No lymphadenopathy noted in the abdomen. Other: Small amount of increased T2 signal intensity in the retroperitoneum adjacent to the pancreas. No significant volume of ascites. Musculoskeletal: No aggressive appearing osseous lesions are noted in the visualized portions of the skeleton. IMPRESSION: 1. Status post cholecystectomy with surgical drain in the gallbladder fossa which appears to extend into a small postoperative fluid collection which likely represents a small postoperative seroma. 2. Severely dilated common bile duct with extensive choledocholithiasis filling the common bile duct. This is associated with both mild intrahepatic biliary ductal dilatation, and mild pancreatic ductal dilatation, as well as evidence of acute pancreatitis, as detailed above. 3. Dependent areas of signal intensity in the lower lobes of the lungs bilaterally favored to reflect some postoperative atelectasis, although some degree of airspace consolidation is not entirely excluded. Electronically Signed   By: Quillian Quince  Entrikin M.D.   On: 02/15/2020 14:59   MR ABDOMEN MRCP W WO CONTAST  Result Date: 02/15/2020 CLINICAL DATA:  59 year old female with history of abdominal pain. Elevated bilirubin level. Status post laparoscopic cholecystectomy. Evaluate for biliary tract obstruction (retained stone in the common bile duct). EXAM: MRI ABDOMEN WITHOUT AND WITH CONTRAST (INCLUDING  MRCP) TECHNIQUE: Multiplanar multisequence MR imaging of the abdomen was performed both before and after the administration of intravenous contrast. Heavily T2-weighted images of the biliary and pancreatic ducts were obtained, and three-dimensional MRCP images were rendered by post processing. CONTRAST:  63mL GADAVIST GADOBUTROL 1 MMOL/ML IV SOLN COMPARISON:  No prior abdominal MRI. CT the abdomen and pelvis 06/29/2019. FINDINGS: Lower chest: Dependent areas of signal intensity are noted in the lower lobes of the lungs bilaterally, which may reflect areas of atelectasis or consolidation (poorly evaluated by magnetic resonance). Hepatobiliary: No suspicious cystic or solid hepatic lesions. Status post cholecystectomy. Linear signal void in the gallbladder fossa, presumably a surgical drain. The tip of this drainage catheter extends into a small postoperative fluid collection in the gallbladder fossa which measures approximately 5.4 x 1.6 cm (axial image 26 of series 3). MRCP images demonstrate mild intrahepatic biliary ductal dilatation. There is also marked dilatation of the common bile duct which measures up to 18 mm in diameter in the porta hepatis. There are numerous filling defects within the common bile duct compatible with retained ductal stones. Pancreas: No pancreatic mass. Mild dilatation of the main pancreatic duct (4 mm). Small amount of peripancreatic increased T2 signal intensity compatible with trace amount of peripancreatic fluid/inflammation. No well-defined pancreatic or peripancreatic fluid collections. Spleen:  Unremarkable. Adrenals/Urinary Tract: Numerous subcentimeter T1 hypointense, T2 hyperintense, nonenhancing lesions in both kidneys, compatible with tiny simple cysts. No suspicious renal lesions. Bilateral adrenal glands are normal in appearance. No hydroureteronephrosis in the visualized portions of the abdomen. Stomach/Bowel: Visualized portions are unremarkable. Vascular/Lymphatic:  Aortic atherosclerosis, without evidence of aneurysm in the abdominal vasculature. No lymphadenopathy noted in the abdomen. Other: Small amount of increased T2 signal intensity in the retroperitoneum adjacent to the pancreas. No significant volume of ascites. Musculoskeletal: No aggressive appearing osseous lesions are noted in the visualized portions of the skeleton. IMPRESSION: 1. Status post cholecystectomy with surgical drain in the gallbladder fossa which appears to extend into a small postoperative fluid collection which likely represents a small postoperative seroma. 2. Severely dilated common bile duct with extensive choledocholithiasis filling the common bile duct. This is associated with both mild intrahepatic biliary ductal dilatation, and mild pancreatic ductal dilatation, as well as evidence of acute pancreatitis, as detailed above. 3. Dependent areas of signal intensity in the lower lobes of the lungs bilaterally favored to reflect some postoperative atelectasis, although some degree of airspace consolidation is not entirely excluded. Electronically Signed   By: Vinnie Langton M.D.   On: 02/15/2020 14:59    Anti-infectives: Anti-infectives (From admission, onward)   Start     Dose/Rate Route Frequency Ordered Stop   02/15/20 1900  meropenem (MERREM) 1 g in sodium chloride 0.9 % 100 mL IVPB     Discontinue     1 g 200 mL/hr over 30 Minutes Intravenous Every 8 hours 02/15/20 1803     02/15/20 1800  piperacillin-tazobactam (ZOSYN) IVPB 3.375 g  Status:  Discontinued        3.375 g 12.5 mL/hr over 240 Minutes Intravenous Every 6 hours 02/15/20 1701 02/15/20 1803   02/15/20 1700  meropenem (MERREM) 500 mg in sodium  chloride 0.9 % 100 mL IVPB  Status:  Discontinued        500 mg 200 mL/hr over 30 Minutes Intravenous Every 8 hours 02/15/20 1656 02/15/20 1701   02/15/20 1000  piperacillin-tazobactam (ZOSYN) IVPB 3.375 g  Status:  Discontinued        3.375 g 12.5 mL/hr over 240 Minutes  Intravenous Every 8 hours 02/15/20 0932 02/15/20 1656   02/12/20 1500  metroNIDAZOLE (FLAGYL) IVPB 500 mg        500 mg 100 mL/hr over 60 Minutes Intravenous Every 8 hours 02/12/20 1433 02/12/20 1701   02/12/20 1500  ciprofloxacin (CIPRO) IVPB 400 mg        400 mg 200 mL/hr over 60 Minutes Intravenous Every 12 hours 02/12/20 1433 02/12/20 1655   02/12/20 0600  vancomycin (VANCOREADY) IVPB 1500 mg/300 mL        1,500 mg 150 mL/hr over 120 Minutes Intravenous On call to O.R. 02/11/20 6301 02/12/20 1035       Assessment/Plan MS HTN Hx CHF ABL Anemia - Hgb 9.2. AM labs pending.  Hyperglycemia - SSI. A1c 5.2 R pleural effusion - On o2. Wean as able LBBB - noted on workup. Hx of LBBB on EKG 1/17. Tn 27. No CP UTI - UCx pending. CCM checking cortisol level for possible adrenal insufficieny   Hx of Acute Cholecystitis tx w/ Perc Chole tube 07/2019 S/p Laparoscopic Cholecystectomy w/ drain placement by Dr. Kae Heller 02/12/2020 - POD #4 Sepsis w/ Choledocholithiasis and Cholangitis s/p ERCP 8/16 by Dr. Rush Landmark Gallstone Pancreatitis   - Appreciate the coordinated help of GI, CCM and TRH with her care - Cont abx - AM labs pending  - Restart CLD - Maintain drain - Mobilze - Pulm toilet   FEN - NPO, IVF VTE - SCDs, Lovenox  ID - Cirpo/Flagyl peri-op. Zosyn 8/16 (allergy). Merrem 8/16 >> WBC 22.7. Blood and urine cultures pending.     LOS: 1 day    Jillyn Ledger , [facility] 02/16/2020, 8:41 AM Please see Amion for pager number during day hours 7:00am-4:30pm

## 2020-02-17 ENCOUNTER — Encounter (HOSPITAL_COMMUNITY): Payer: Self-pay | Admitting: Gastroenterology

## 2020-02-17 DIAGNOSIS — B957 Other staphylococcus as the cause of diseases classified elsewhere: Secondary | ICD-10-CM

## 2020-02-17 DIAGNOSIS — R7881 Bacteremia: Secondary | ICD-10-CM

## 2020-02-17 LAB — COMPREHENSIVE METABOLIC PANEL
ALT: 69 U/L — ABNORMAL HIGH (ref 0–44)
AST: 44 U/L — ABNORMAL HIGH (ref 15–41)
Albumin: 2.5 g/dL — ABNORMAL LOW (ref 3.5–5.0)
Alkaline Phosphatase: 412 U/L — ABNORMAL HIGH (ref 38–126)
Anion gap: 8 (ref 5–15)
BUN: 22 mg/dL — ABNORMAL HIGH (ref 6–20)
CO2: 21 mmol/L — ABNORMAL LOW (ref 22–32)
Calcium: 7.7 mg/dL — ABNORMAL LOW (ref 8.9–10.3)
Chloride: 113 mmol/L — ABNORMAL HIGH (ref 98–111)
Creatinine, Ser: 1.05 mg/dL — ABNORMAL HIGH (ref 0.44–1.00)
GFR calc Af Amer: >60 mL/min (ref >60)
GFR calc non Af Amer: 59 mL/min — ABNORMAL LOW (ref >60)
Glucose, Bld: 125 mg/dL — ABNORMAL HIGH (ref 70–99)
Potassium: 3.9 mmol/L (ref 3.5–5.1)
Sodium: 142 mmol/L (ref 135–145)
Total Bilirubin: 2.5 mg/dL — ABNORMAL HIGH (ref 0.3–1.2)
Total Protein: 5.7 g/dL — ABNORMAL LOW (ref 6.5–8.1)

## 2020-02-17 LAB — LIPASE, BLOOD: Lipase: 183 U/L — ABNORMAL HIGH (ref 11–51)

## 2020-02-17 LAB — CBC
HCT: 25.5 % — ABNORMAL LOW (ref 36.0–46.0)
Hemoglobin: 8.3 g/dL — ABNORMAL LOW (ref 12.0–15.0)
MCH: 28.5 pg (ref 26.0–34.0)
MCHC: 32.5 g/dL (ref 30.0–36.0)
MCV: 87.6 fL (ref 80.0–100.0)
Platelets: 237 10*3/uL (ref 150–400)
RBC: 2.91 MIL/uL — ABNORMAL LOW (ref 3.87–5.11)
RDW: 15 % (ref 11.5–15.5)
WBC: 13 10*3/uL — ABNORMAL HIGH (ref 4.0–10.5)
nRBC: 0 % (ref 0.0–0.2)

## 2020-02-17 LAB — GLUCOSE, CAPILLARY
Glucose-Capillary: 111 mg/dL — ABNORMAL HIGH (ref 70–99)
Glucose-Capillary: 126 mg/dL — ABNORMAL HIGH (ref 70–99)
Glucose-Capillary: 102 mg/dL — ABNORMAL HIGH (ref 70–99)
Glucose-Capillary: 123 mg/dL — ABNORMAL HIGH (ref 70–99)
Glucose-Capillary: 111 mg/dL — ABNORMAL HIGH (ref 70–99)

## 2020-02-17 MED ORDER — SODIUM CHLORIDE 0.9% FLUSH: 10.0000 mL | INTRAVENOUS | Status: DC | PRN

## 2020-02-17 MED ORDER — GABAPENTIN 400 MG PO CAPS
800.0000 mg | ORAL_CAPSULE | Freq: Three times a day (TID) | ORAL | Status: DC
  Administered 2020-02-17 – 2020-02-20 (×10): 800 mg via ORAL
  Filled 2020-02-17 (×10): qty 2

## 2020-02-17 MED ORDER — SODIUM CHLORIDE 0.9% FLUSH
10.0000 mL | Freq: Two times a day (BID) | INTRAVENOUS | Status: DC
  Administered 2020-02-18: 10 mL

## 2020-02-17 MED ORDER — METOPROLOL SUCCINATE ER 50 MG PO TB24
150.0000 mg | ORAL_TABLET | Freq: Every day | ORAL | Status: DC
  Administered 2020-02-18 – 2020-02-20 (×3): 150 mg via ORAL
  Filled 2020-02-17: qty 3
  Filled 2020-02-17: qty 6
  Filled 2020-02-17: qty 3

## 2020-02-17 MED ORDER — POLYETHYLENE GLYCOL 3350 17 G PO PACK
17.0000 g | PACK | Freq: Every day | ORAL | Status: DC
  Administered 2020-02-17 – 2020-02-19 (×3): 17 g via ORAL
  Filled 2020-02-17 (×5): qty 1

## 2020-02-17 MED ORDER — SPIRONOLACTONE 25 MG PO TABS
25.0000 mg | ORAL_TABLET | Freq: Every day | ORAL | Status: DC
  Administered 2020-02-17 – 2020-02-19 (×3): 25 mg via ORAL
  Filled 2020-02-17 (×4): qty 1

## 2020-02-17 NOTE — Progress Notes (Signed)
PROGRESS NOTE    Shannon Garrett  FHL:[id number] DOB: December 05, 1960 DOA: 02/12/2020 PCP: Gildardo Cranker, DO   Brief Narrative:  Shannon Garrett is an 59 y.o. female with past medical history systolic congestive heart failure, depression, HTN , COVID-19 infection in 05/2019 , hypertension, multiple sclerosis who  was admitted here under general surgery service  for lap cholecystectomy.  She was admitted here in January this year with cholecystitis.  At the time cholecystectomy was not possible and she underwent percutaneous cholecystostomy tube placement.  She underwent lap chole with drain placement on 02/12/2020 and was planned for skilled nursing facility placement but she developed fever, tachycardia and hypotension overnight with elevated white cell counts, elevated lipase, elevated LFTs.  Sepsis secondary to possible cholangitis was suspected.  Started on broad-spectrum antibiotics, blood culture sent.  She underwent urgent ERCP by GI on 02/15/2020. Post procedure, she became hypotensive and had to be transferred to ICU and was started on Levophed.  Currently hemodynamically stable.  Assessment & Plan:   Principal Problem:   Cholangitis from CBD obstruction s/p ERCP 02/15/2020 Active Problems:   Obesity   MS (multiple sclerosis) (HCC)   Chronic diastolic CHF (congestive heart failure) (HCC)   Paraplegia (HCC)   Insulin dependent diabetes mellitus type IA ([place])   Cholecystitis   Choledocholithiasis s/p ERCP 02/15/2020  Septic shock/cholangitis/CBD stones:Status post lap cholecystectomy with drain placment.  Met septic shock criteria with  fever, hypotension, tachycardia, leukocytosis .  Elevated lipase and liver enzymes. MRCP showed severely dilated common bile duct with extensive choledocholithiasis filling the common bile duct,associated mild intrahepatic biliary ductal dilatation, and mild pancreatic ductal dilatation, as well as evidence of acute pancreatitis.  ERCP done on 02/17/2020 showed  edematous ulcerated major papula, dilated biliary tree with multiple stones and sludge. Successful complete removal of the stones with biliary sphincterectomy and balloon sphincteroplasty. Patient was septic on last admission when she was admitted for cholecystitis, her bile cultures at that time showed ESBL E. coli and Enterococcus and she was treated with antibiotics.  She was discharged on cholecystostomy drain. Blood pressure stable today but got report that she was hypotensive last night. Blood cultures showed staphylococcal hominis. On vanco and merem.Consulted ID,talked to Dr Linus Salmons who will follow. Continuing monitoring CBC,CMP  Acute hypoxic respiratory failure: Currently on 2 L of oxygen per minute.  Chest x-ray showed trace right pleural effusion, continue to attempt to wean the oxygen.This is most likely from atelectasis from recent surgery.  History of chronic systolic congestive heart failure: Echo done on 1/21 showed EF of 40 to 45%, global hypokinesis, moderately elevated pulmonary artery pressure.  She is on spironolactone  at home. Cardiac cath done on 4/21 showed nonischemic cardiomyopathy, angiographically normal coronary arteries.  Insulin-dependent diabetes: Monitor blood sugars.  Currently on sliding scale insulin.  Last hemoglobin A1C was 7.6.  Morbid obesity: BMI of 42.8.  Elevated troponin: Most likely this is from sepsis.  No further work-up necessary.  Normocytic anemia: Currently hemoglobin stable.  Continue to monitor  Debility/deconditioning/multiple sclerosis: Patient has history of multiple sclerosis and paraplegic, bed bound. Plan for skilled nursing facility placement, she is long-term nursing home resident.  On amantadine, baclofen  Depression: On bupropion, duloxetine  Hypertension:  BP stable this mrng.  Home medications on hold  Thank you for this consultation.  Our [facility] hospitalist team will follow the patient with you.          DVT  prophylaxis:lovenox Code Status: DNR Family Communication: None present at the  bedside Status is: Inpatient   Procedures: Lap cholecystectomy, ERCP  Antimicrobials:  Anti-infectives (From admission, onward)   Start     Dose/Rate Route Frequency Ordered Stop   02/17/20 0500  vancomycin (VANCOREADY) IVPB 1250 mg/250 mL     Discontinue     1,250 mg 166.7 mL/hr over 90 Minutes Intravenous Every 12 hours 02/16/20 1421     02/16/20 1430  vancomycin (VANCOREADY) IVPB 2000 mg/400 mL        2,000 mg 200 mL/hr over 120 Minutes Intravenous  Once 02/16/20 1416 02/16/20 1519   02/15/20 1900  meropenem (MERREM) 1 g in sodium chloride 0.9 % 100 mL IVPB     Discontinue     1 g 200 mL/hr over 30 Minutes Intravenous Every 8 hours 02/15/20 1803     02/15/20 1800  piperacillin-tazobactam (ZOSYN) IVPB 3.375 g  Status:  Discontinued        3.375 g 12.5 mL/hr over 240 Minutes Intravenous Every 6 hours 02/15/20 1701 02/15/20 1803   02/15/20 1700  meropenem (MERREM) 500 mg in sodium chloride 0.9 % 100 mL IVPB  Status:  Discontinued        500 mg 200 mL/hr over 30 Minutes Intravenous Every 8 hours 02/15/20 1656 02/15/20 1701   02/15/20 1000  piperacillin-tazobactam (ZOSYN) IVPB 3.375 g  Status:  Discontinued        3.375 g 12.5 mL/hr over 240 Minutes Intravenous Every 8 hours 02/15/20 0932 02/15/20 1656   02/12/20 1500  metroNIDAZOLE (FLAGYL) IVPB 500 mg        500 mg 100 mL/hr over 60 Minutes Intravenous Every 8 hours 02/12/20 1433 02/12/20 1701   02/12/20 1500  ciprofloxacin (CIPRO) IVPB 400 mg        400 mg 200 mL/hr over 60 Minutes Intravenous Every 12 hours 02/12/20 1433 02/12/20 1655   02/12/20 0600  vancomycin (VANCOREADY) IVPB 1500 mg/300 mL        1,500 mg 150 mL/hr over 120 Minutes Intravenous On call to O.R. 02/11/20 7510 02/12/20 1035      Subjective: Patient seen and examined at bedside this morning.  She was hypotensive last night and had to be started on some pressures but during my  evaluation today her blood pressure was stable.  She complains of some abdominal discomfort today.  Afebrile.  She was eating her breakfast when I saw her.    Objective: Vitals:   02/17/20 0300 02/17/20 0400 02/17/20 0600 02/17/20 0800  BP:  (!) 158/72 (!) 160/81 (!) 168/70  Pulse: 94 93 94 93  Resp: 15 11 (!) 24 14  Temp:  98.3 F (36.8 C)    TempSrc:  Axillary    SpO2: 99% 100% 100% 100%  Weight:      Height:        Intake/Output Summary (Last 24 hours) at 02/17/2020 0810 Last data filed at 02/17/2020 2585 Gross per 24 hour  Intake 2020.93 ml  Output 1080 ml  Net 940.93 ml   Filed Weights   02/08/20 1205 02/12/20 0715 02/15/20 1019  Weight: 113.4 kg 113.4 kg 113.3 kg    Examination:   General exam: Comfortable, lying on the bed, obese  Respiratory system: Bilateral diminished air sounds on the bases  cardiovascular system: S1 & S2 heard, RRR. No JVD, murmurs, rubs, gallops or clicks. Gastrointestinal system: Abdomen is distended, soft and mostly nontender. No organomegaly or masses felt. Normal bowel sounds heard.Drain on right upper quadrant Central nervous system: Alert and oriented. paraplegia  Extremities: trace bilateral lower extremity edema, no clubbing ,no cyanosis, lower extremities contractures  skin: No no icterus ,no pallor   Data Reviewed: I have personally reviewed following labs and imaging studies  CBC: Recent Labs  Lab 02/12/20 0800 02/13/20 0443 02/14/20 0550 02/15/20 0505 02/15/20 2115 02/16/20 1018 02/17/20 0206  WBC 5.9   < > 8.7 17.2* 22.7* 16.2* 13.0*  NEUTROABS 3.3  --   --   --   --   --   --   HGB 12.4   < > 9.3* 9.6* 9.2* 8.4* 8.3*  HCT 37.2   < > 29.1* 28.6* 27.8* 25.3* 25.5*  MCV 86.5   < > 88.7 86.1 86.6 86.6 87.6  PLT 313   < > 233 238 257 204 237   < > = values in this interval not displayed.   Basic Metabolic Panel: Recent Labs  Lab 02/14/20 0550 02/15/20 0505 02/15/20 2115 02/16/20 1018 02/17/20 0206  NA 141 140  145 144 142  K 3.7 3.7 3.6 3.8 3.9  CL 110 106 110 110 113*  CO2 _0 21* 21*  GLUCOSE 68* 178* 93 102* 125*  BUN _1 23* 22*  CREATININE 0.89 1.17* 1.44* 1.31* 1.05*  CALCIUM 8.3* 8.3* 8.1* 7.7* 7.7*   GFR: Estimated Creatinine Clearance: 72.9 mL/min (A) (by C-G formula based on SCr of 1.05 mg/dL (H)). Liver Function Tests: Recent Labs  Lab 02/14/20 0550 02/15/20 0742 02/15/20 2115 02/16/20 1018 02/17/20 0206  AST 23 95* 46* 30 44*  ALT 42 118* 86* 71* 69*  ALKPHOS 189* 532* 446* 391* 412*  BILITOT 0.7 2.9* 3.4* 2.9* 2.5*  PROT 5.6* 5.9* 5.7* 5.6* 5.7*  ALBUMIN 2.7* 2.8* 2.6* 2.8* 2.5*   Recent Labs  Lab 02/15/20 0742 02/16/20 1018 02/17/20 0206  LIPASE 1,808* 308* 183*   No results for input(s): AMMONIA in the last 168 hours. Coagulation Profile: Recent Labs  Lab 02/15/20 1708  INR 1.1   Cardiac Enzymes: No results for input(s): CKTOTAL, CKMB, CKMBINDEX, TROPONINI in the last 168 hours. BNP (last 3 results) No results for input(s): PROBNP in the last 8760 hours. HbA1C: No results for input(s): HGBA1C in the last 72 hours. CBG: Recent Labs  Lab 02/16/20 1129 02/16/20 1521 02/16/20 1925 02/16/20 2341 02/17/20 0738  GLUCAP 114* 146* 141* 123* 111*   Lipid Profile: No results for input(s): CHOL, HDL, LDLCALC, TRIG, CHOLHDL, LDLDIRECT in the last 72 hours. Thyroid Function Tests: No results for input(s): TSH, T4TOTAL, FREET4, T3FREE, THYROIDAB in the last 72 hours. Anemia Panel: No results for input(s): VITAMINB12, FOLATE, FERRITIN, TIBC, IRON, RETICCTPCT in the last 72 hours. Sepsis Labs: Recent Labs  Lab 02/15/20 2115  LATICACIDVEN 0.9    Recent Results (from the past 240 hour(s))  SARS Coronavirus 2 by RT PCR (hospital order, performed in [facility] hospital lab) Nasopharyngeal Nasopharyngeal Swab     Status: None   Collection Time: 02/12/20  7:14 AM   Specimen: Nasopharyngeal Swab  Result Value Ref Range Status   SARS Coronavirus 2  NEGATIVE NEGATIVE Final    Comment: (NOTE) SARS-CoV-2 target nucleic acids are NOT DETECTED.  The SARS-CoV-2 RNA is generally detectable in upper and lower respiratory specimens during the acute phase of infection. The lowest concentration of SARS-CoV-2 viral copies this assay can detect is 250 copies / mL. A negative result does not preclude SARS-CoV-2 infection and should not be used as the sole basis for treatment or other patient management decisions.  A negative result may occur with improper specimen collection / handling, submission of specimen other than nasopharyngeal swab, presence of viral mutation(s) within the areas targeted by this assay, and inadequate number of viral copies (<250 copies / mL). A negative result must be combined with clinical observations, patient history, and epidemiological information.  Fact Sheet for Patients:   StrictlyIdeas.no  Fact Sheet for Healthcare Providers: BankingDealers.co.za  This test is not yet approved or  cleared by the [place] FDA and has been authorized for detection and/or diagnosis of SARS-CoV-2 by FDA under an Emergency Use Authorization (EUA).  This EUA will remain in effect (meaning this test can be used) for the duration of the COVID-19 declaration under Section 564(b)(1) of the Act, 21 U.S.C. section 360bbb-3(b)(1), unless the authorization is terminated or revoked sooner.  Performed at [facility], [place] [address] [place], [place] [address]   Culture, blood (routine x 2)     Status: None (Preliminary result)   Collection Time: 02/15/20  7:42 AM   Specimen: BLOOD  Result Value Ref Range Status   Specimen Description   Final    BLOOD RIGHT ARM Performed at [place] [address], [place], [place] [address]    Special Requests   Final    BOTTLES DRAWN AEROBIC AND ANAEROBIC Blood Culture adequate volume Performed at  [place] [address], [place], [place] [address]    Culture  Setup Time   Final    GRAM POSITIVE COCCI ANAEROBIC BOTTLE ONLY CRITICAL VALUE NOTED.  VALUE IS CONSISTENT WITH PREVIOUSLY REPORTED AND CALLED VALUE. Performed at [facility], [place] [address], [place], [place] [address]    Culture GRAM POSITIVE COCCI  Final   Report Status PENDING  Incomplete  Culture, blood (routine x 2)     Status: None (Preliminary result)   Collection Time: 02/15/20  7:51 AM   Specimen: BLOOD  Result Value Ref Range Status   Specimen Description   Final    BLOOD RIGHT ARM LOWER Performed at [facility], [place] [address] [place], [place] [address]    Special Requests   Final    BOTTLES DRAWN AEROBIC AND ANAEROBIC Blood Culture adequate volume Performed at [place] [address], [place], [place] [address]    Culture  Setup Time   Final    GRAM POSITIVE COCCI IN CLUSTERS IN BOTH AEROBIC AND ANAEROBIC BOTTLES CRITICAL RESULT CALLED TO, READ BACK BY AND VERIFIED WITH: PHARMD J. GADHIA 02/16/20 0918 FCP Performed at [facility], [place] [address], [place], [place] [address]    Culture GRAM POSITIVE COCCI  Final   Report Status PENDING  Incomplete  Blood Culture ID Panel (Reflexed)     Status: Abnormal   Collection Time: 02/15/20  7:51 AM  Result Value Ref Range Status   Enterococcus faecalis NOT DETECTED NOT DETECTED Final   Enterococcus Faecium NOT DETECTED NOT DETECTED Final   Listeria monocytogenes NOT DETECTED NOT DETECTED Final   Staphylococcus species DETECTED (A) NOT DETECTED Final    Comment: CRITICAL RESULT CALLED TO, READ BACK BY AND VERIFIED WITH: PHARMD J. GADHIA 02/16/20 0918 FCP    Staphylococcus aureus (BCID) NOT DETECTED NOT DETECTED Final   Staphylococcus epidermidis NOT DETECTED NOT DETECTED Final   Staphylococcus lugdunensis NOT DETECTED NOT DETECTED Final   Streptococcus species NOT DETECTED NOT  DETECTED Final   Streptococcus agalactiae NOT DETECTED NOT DETECTED Final  Streptococcus pneumoniae NOT DETECTED NOT DETECTED Final   Streptococcus pyogenes NOT DETECTED NOT DETECTED Final   A.calcoaceticus-baumannii NOT DETECTED NOT DETECTED Final   Bacteroides fragilis NOT DETECTED NOT DETECTED Final   Enterobacterales NOT DETECTED NOT DETECTED Final   Enterobacter cloacae complex NOT DETECTED NOT DETECTED Final   Escherichia coli NOT DETECTED NOT DETECTED Final   Klebsiella aerogenes NOT DETECTED NOT DETECTED Final   Klebsiella oxytoca NOT DETECTED NOT DETECTED Final   Klebsiella pneumoniae NOT DETECTED NOT DETECTED Final   Proteus species NOT DETECTED NOT DETECTED Final   Salmonella species NOT DETECTED NOT DETECTED Final   Serratia marcescens NOT DETECTED NOT DETECTED Final   Haemophilus influenzae NOT DETECTED NOT DETECTED Final   Neisseria meningitidis NOT DETECTED NOT DETECTED Final   Pseudomonas aeruginosa NOT DETECTED NOT DETECTED Final   Stenotrophomonas maltophilia NOT DETECTED NOT DETECTED Final   Candida albicans NOT DETECTED NOT DETECTED Final   Candida auris NOT DETECTED NOT DETECTED Final   Candida glabrata NOT DETECTED NOT DETECTED Final   Candida krusei NOT DETECTED NOT DETECTED Final   Candida parapsilosis NOT DETECTED NOT DETECTED Final   Candida tropicalis NOT DETECTED NOT DETECTED Final   Cryptococcus neoformans/gattii NOT DETECTED NOT DETECTED Final    Comment: Performed at [facility], [address], [place], [place] [address]  Culture, Urine     Status: Abnormal   Collection Time: 02/15/20  1:51 PM   Specimen: Urine, Clean Catch  Result Value Ref Range Status   Specimen Description   Final    URINE, CLEAN CATCH Performed at [facility], [place] [address] [place], [place] [address]    Special Requests   Final    NONE Performed at [facility], [place] [address], [place], [place] [address]    Culture (A)  Final      20,000 COLONIES/mL MULTIPLE SPECIES PRESENT, SUGGEST RECOLLECTION   Report Status 02/16/2020 FINAL  Final  MRSA PCR Screening     Status: Abnormal   Collection Time: 02/15/20  7:06 PM   Specimen: Nasal Mucosa; Nasopharyngeal  Result Value Ref Range Status   MRSA by PCR POSITIVE (A) NEGATIVE Final    Comment:        The GeneXpert MRSA Assay (FDA approved for NASAL specimens only), is one component of a comprehensive MRSA colonization surveillance program. It is not intended to diagnose MRSA infection nor to guide or monitor treatment for MRSA infections. RESULT CALLED TO, READ BACK BY AND VERIFIED WITH: RN B BROOKS AT 2317 02/15/20 CRUICKSHANK A Performed at [facility], [place] [address] [place],  [address]          Radiology Studies: DG Chest 1 View  Result Date: 02/15/2020 CLINICAL DATA:  59 year old female status post central line placement. EXAM: CHEST  1 VIEW COMPARISON:  Chest radiograph dated 02/15/2020. FINDINGS: Interval placement of a left IJ central venous line with tip over central SVC. No pneumothorax. There is shallow inspiration with bibasilar atelectasis. Left mid to lower lung field streaky densities, progressed since the prior radiograph may represent atelectasis although infiltrate is not excluded. Clinical correlation is recommended. No large pleural effusion. Stable mild cardiomegaly. No acute osseous pathology. IMPRESSION: 1. Interval placement of a left IJ central venous line with tip over central SVC. No pneumothorax. 2. Left mid to lower lung field atelectasis versus infiltrate. Electronically Signed   By: Anner Crete M.D.   On: 02/15/2020 21:40   DG Chest  2 View  Result Date: 02/15/2020 CLINICAL DATA:  Fever.  Bilateral upper extremity swelling. EXAM: CHEST - 2 VIEW COMPARISON:  Chest x-ray dated May 10, 2019. FINDINGS: Stable cardiomediastinal silhouette. Normal pulmonary vascularity. Low lung volumes. Trace right  pleural effusion. No focal consolidation or pneumothorax. No acute osseous abnormality. IMPRESSION: 1. Trace right pleural effusion. Electronically Signed   By: Titus Dubin M.D.   On: 02/15/2020 08:30   MR 3D Recon At Scanner  Result Date: 02/15/2020 CLINICAL DATA:  59 year old female with history of abdominal pain. Elevated bilirubin level. Status post laparoscopic cholecystectomy. Evaluate for biliary tract obstruction (retained stone in the common bile duct). EXAM: MRI ABDOMEN WITHOUT AND WITH CONTRAST (INCLUDING MRCP) TECHNIQUE: Multiplanar multisequence MR imaging of the abdomen was performed both before and after the administration of intravenous contrast. Heavily T2-weighted images of the biliary and pancreatic ducts were obtained, and three-dimensional MRCP images were rendered by post processing. CONTRAST:  86m GADAVIST GADOBUTROL 1 MMOL/ML IV SOLN COMPARISON:  No prior abdominal MRI. CT the abdomen and pelvis 06/29/2019. FINDINGS: Lower chest: Dependent areas of signal intensity are noted in the lower lobes of the lungs bilaterally, which may reflect areas of atelectasis or consolidation (poorly evaluated by magnetic resonance). Hepatobiliary: No suspicious cystic or solid hepatic lesions. Status post cholecystectomy. Linear signal void in the gallbladder fossa, presumably a surgical drain. The tip of this drainage catheter extends into a small postoperative fluid collection in the gallbladder fossa which measures approximately 5.4 x 1.6 cm (axial image 26 of series 3). MRCP images demonstrate mild intrahepatic biliary ductal dilatation. There is also marked dilatation of the common bile duct which measures up to 18 mm in diameter in the porta hepatis. There are numerous filling defects within the common bile duct compatible with retained ductal stones. Pancreas: No pancreatic mass. Mild dilatation of the main pancreatic duct (4 mm). Small amount of peripancreatic increased T2 signal intensity  compatible with trace amount of peripancreatic fluid/inflammation. No well-defined pancreatic or peripancreatic fluid collections. Spleen:  Unremarkable. Adrenals/Urinary Tract: Numerous subcentimeter T1 hypointense, T2 hyperintense, nonenhancing lesions in both kidneys, compatible with tiny simple cysts. No suspicious renal lesions. Bilateral adrenal glands are normal in appearance. No hydroureteronephrosis in the visualized portions of the abdomen. Stomach/Bowel: Visualized portions are unremarkable. Vascular/Lymphatic: Aortic atherosclerosis, without evidence of aneurysm in the abdominal vasculature. No lymphadenopathy noted in the abdomen. Other: Small amount of increased T2 signal intensity in the retroperitoneum adjacent to the pancreas. No significant volume of ascites. Musculoskeletal: No aggressive appearing osseous lesions are noted in the visualized portions of the skeleton. IMPRESSION: 1. Status post cholecystectomy with surgical drain in the gallbladder fossa which appears to extend into a small postoperative fluid collection which likely represents a small postoperative seroma. 2. Severely dilated common bile duct with extensive choledocholithiasis filling the common bile duct. This is associated with both mild intrahepatic biliary ductal dilatation, and mild pancreatic ductal dilatation, as well as evidence of acute pancreatitis, as detailed above. 3. Dependent areas of signal intensity in the lower lobes of the lungs bilaterally favored to reflect some postoperative atelectasis, although some degree of airspace consolidation is not entirely excluded. Electronically Signed   By: DVinnie LangtonM.D.   On: 02/15/2020 14:59   DG ERCP BILIARY & PANCREATIC DUCTS  Result Date: 02/16/2020 CLINICAL DATA:  Abdominal pain, elevated bilirubin, post cholecystectomy, choledocholithiasis on MRCP EXAM: ERCP TECHNIQUE: Multiple spot images obtained with the fluoroscopic device and submitted for interpretation  post-procedure. COMPARISON:  MR CP  from the same day FINDINGS: Series of fluoroscopic images document endoscopic cannulation and opacification of the CBD. Filling defects are noted in the distal CBD. Subsequent balloon catheter placement and balloon sweeps documented. There is incomplete opacification of the intrahepatic biliary tree which appears decompressed centrally. Cholecystectomy clips are noted. No evidence of extravasation. IMPRESSION: Endoscopic CBD cannulation and intervention as above. These images were submitted for radiologic interpretation only. Please see the procedural report for the amount of contrast and the fluoroscopy time utilized. Electronically Signed   By: Lucrezia Europe M.D.   On: 02/16/2020 11:08   MR ABDOMEN MRCP W WO CONTAST  Result Date: 02/15/2020 CLINICAL DATA:  59 year old female with history of abdominal pain. Elevated bilirubin level. Status post laparoscopic cholecystectomy. Evaluate for biliary tract obstruction (retained stone in the common bile duct). EXAM: MRI ABDOMEN WITHOUT AND WITH CONTRAST (INCLUDING MRCP) TECHNIQUE: Multiplanar multisequence MR imaging of the abdomen was performed both before and after the administration of intravenous contrast. Heavily T2-weighted images of the biliary and pancreatic ducts were obtained, and three-dimensional MRCP images were rendered by post processing. CONTRAST:  18m GADAVIST GADOBUTROL 1 MMOL/ML IV SOLN COMPARISON:  No prior abdominal MRI. CT the abdomen and pelvis 06/29/2019. FINDINGS: Lower chest: Dependent areas of signal intensity are noted in the lower lobes of the lungs bilaterally, which may reflect areas of atelectasis or consolidation (poorly evaluated by magnetic resonance). Hepatobiliary: No suspicious cystic or solid hepatic lesions. Status post cholecystectomy. Linear signal void in the gallbladder fossa, presumably a surgical drain. The tip of this drainage catheter extends into a small postoperative fluid collection in  the gallbladder fossa which measures approximately 5.4 x 1.6 cm (axial image 26 of series 3). MRCP images demonstrate mild intrahepatic biliary ductal dilatation. There is also marked dilatation of the common bile duct which measures up to 18 mm in diameter in the porta hepatis. There are numerous filling defects within the common bile duct compatible with retained ductal stones. Pancreas: No pancreatic mass. Mild dilatation of the main pancreatic duct (4 mm). Small amount of peripancreatic increased T2 signal intensity compatible with trace amount of peripancreatic fluid/inflammation. No well-defined pancreatic or peripancreatic fluid collections. Spleen:  Unremarkable. Adrenals/Urinary Tract: Numerous subcentimeter T1 hypointense, T2 hyperintense, nonenhancing lesions in both kidneys, compatible with tiny simple cysts. No suspicious renal lesions. Bilateral adrenal glands are normal in appearance. No hydroureteronephrosis in the visualized portions of the abdomen. Stomach/Bowel: Visualized portions are unremarkable. Vascular/Lymphatic: Aortic atherosclerosis, without evidence of aneurysm in the abdominal vasculature. No lymphadenopathy noted in the abdomen. Other: Small amount of increased T2 signal intensity in the retroperitoneum adjacent to the pancreas. No significant volume of ascites. Musculoskeletal: No aggressive appearing osseous lesions are noted in the visualized portions of the skeleton. IMPRESSION: 1. Status post cholecystectomy with surgical drain in the gallbladder fossa which appears to extend into a small postoperative fluid collection which likely represents a small postoperative seroma. 2. Severely dilated common bile duct with extensive choledocholithiasis filling the common bile duct. This is associated with both mild intrahepatic biliary ductal dilatation, and mild pancreatic ductal dilatation, as well as evidence of acute pancreatitis, as detailed above. 3. Dependent areas of signal  intensity in the lower lobes of the lungs bilaterally favored to reflect some postoperative atelectasis, although some degree of airspace consolidation is not entirely excluded. Electronically Signed   By: DVinnie LangtonM.D.   On: 02/15/2020 14:59        Scheduled Meds: . acetaminophen  1,000 mg Oral Q6H  .  amantadine  100 mg Oral Daily  . baclofen  5 mg Oral QID  . buPROPion  150 mg Oral Daily  . Chlorhexidine Gluconate Cloth  6 each Topical Q0600  . docusate sodium  100 mg Oral BID  . DULoxetine  60 mg Oral Daily  . enoxaparin (LOVENOX) injection  40 mg Subcutaneous Q24H  . famotidine  20 mg Oral BID  . gabapentin  800 mg Oral TID  . insulin aspart  0-15 Units Subcutaneous TID WC  . lubiprostone  24 mcg Oral BID WC  . mouth rinse  15 mL Mouth Rinse BID  . melatonin  10 mg Oral QHS  . metoprolol succinate  150 mg Oral Daily  . mupirocin ointment  1 application Nasal BID  . pantoprazole  40 mg Oral Daily  . sodium chloride flush  10-40 mL Intracatheter Q12H  . spironolactone  25 mg Oral Daily   Continuous Infusions: . sodium chloride 75 mL/hr at 02/16/20 0758  . sodium chloride    . meropenem (MERREM) IV Stopped (02/17/20 8719)  . norepinephrine (LEVOPHED) Adult infusion    . phenylephrine (NEO-SYNEPHRINE) Adult infusion 40 mcg/min (02/15/20 2351)  . vancomycin Stopped (02/17/20 0530)     LOS: 2 days    Time spent: 35 mins.More than 50% of that time was spent in counseling and/or coordination of care.      Shelly Coss, MD [facility] P8/18/2021, 8:10 AM

## 2020-02-17 NOTE — Progress Notes (Signed)
2 Days Post-Op  Subjective: CC: Patient required neo overnight. Now weaned off this AM.   Patient reports epigastric abdominal pain that is worse with deep breathing. No CP or calf pain. No sob, on o2. Patient tolerating fld without increased abdominal pain or n/v. Passing flatus. Last BM 8/13.   Blood cultures positive.   Objective: Vital signs in last 24 hours: Temp:  [97.5 F (36.4 C)-98.3 F (36.8 C)] 98.3 F (36.8 C) (08/18 0400) Pulse Rate:  [85-94] 93 (08/18 0800) Resp:  [11-24] 14 (08/18 0800) BP: (126-168)/(60-110) 168/70 (08/18 0800) SpO2:  [99 %-100 %] 100 % (08/18 0800) Arterial Line BP: (91-136)/(48-64) 131/55 (08/18 0800) Last BM Date: 02/12/20  Intake/Output from previous day: 08/17 0701 - 08/18 0700 In: 2095.9 [I.V.:1700; IV Piggyback:395.9] Out: 1080 [Urine:1050; Drains:30] Intake/Output this shift: No intake/output data recorded.  PE: Gen: Alert, NAD, pleasant Card: RRR Pulm: CTA to upper lung fields. Distant at bases. Normal rate and effort. On o2. Abd: Soft,RUQ and epigastric tenderness. No peritonitis.+BS.Incisions with glue intact appears well and are without drainage, bleeding. There is some faint blanchable erythema surrounding without heat, induration, fluctuance, or drainage. Monitor incisions. JP drain w/ brown tinged serous (surgicel).  GU: Ext foley. Dark red, cloudy urine in cannister  Ext: No LE edema or calf tenderness. Chronic LE contractures. SCDs in place  Skin: no rashes noted, warm and dry  Lab Results:  Recent Labs    02/16/20 1018 02/17/20 0206  WBC 16.2* 13.0*  HGB 8.4* 8.3*  HCT 25.3* 25.5*  PLT 204 237   BMET Recent Labs    02/16/20 1018 02/17/20 0206  NA 144 142  K 3.8 3.9  CL 110 113*  CO2 21* 21*  GLUCOSE 102* 125*  BUN 23* 22*  CREATININE 1.31* 1.05*  CALCIUM 7.7* 7.7*   PT/INR Recent Labs    02/15/20 1708  LABPROT 14.0  INR 1.1   CMP     Component Value Date/Time   NA 142 02/17/2020  0206   NA 144 10/26/2019 1100   K 3.9 02/17/2020 0206   CL 113 (H) 02/17/2020 0206   CO2 21 (L) 02/17/2020 0206   GLUCOSE 125 (H) 02/17/2020 0206   BUN 22 (H) 02/17/2020 0206   BUN 21 10/26/2019 1100   CREATININE 1.05 (H) 02/17/2020 0206   CALCIUM 7.7 (L) 02/17/2020 0206   PROT 5.7 (L) 02/17/2020 0206   PROT 6.4 08/26/2018 0959   ALBUMIN 2.5 (L) 02/17/2020 0206   ALBUMIN 3.9 08/26/2018 0959   AST 44 (H) 02/17/2020 0206   ALT 69 (H) 02/17/2020 0206   ALKPHOS 412 (H) 02/17/2020 0206   BILITOT 2.5 (H) 02/17/2020 0206   BILITOT 0.2 08/26/2018 0959   GFRNONAA 59 (L) 02/17/2020 0206   GFRAA >60 02/17/2020 0206   Lipase     Component Value Date/Time   LIPASE 183 (H) 02/17/2020 0206       Studies/Results: DG Chest 1 View  Result Date: 02/15/2020 CLINICAL DATA:  59 year old female status post central line placement. EXAM: CHEST  1 VIEW COMPARISON:  Chest radiograph dated 02/15/2020. FINDINGS: Interval placement of a left IJ central venous line with tip over central SVC. No pneumothorax. There is shallow inspiration with bibasilar atelectasis. Left mid to lower lung field streaky densities, progressed since the prior radiograph may represent atelectasis although infiltrate is not excluded. Clinical correlation is recommended. No large pleural effusion. Stable mild cardiomegaly. No acute osseous pathology. IMPRESSION: 1. Interval placement of a left IJ  central venous line with tip over central SVC. No pneumothorax. 2. Left mid to lower lung field atelectasis versus infiltrate. Electronically Signed   By: Anner Crete M.D.   On: 02/15/2020 21:40   DG Chest 2 View  Result Date: 02/15/2020 CLINICAL DATA:  Fever.  Bilateral upper extremity swelling. EXAM: CHEST - 2 VIEW COMPARISON:  Chest x-ray dated May 10, 2019. FINDINGS: Stable cardiomediastinal silhouette. Normal pulmonary vascularity. Low lung volumes. Trace right pleural effusion. No focal consolidation or pneumothorax. No acute  osseous abnormality. IMPRESSION: 1. Trace right pleural effusion. Electronically Signed   By: Titus Dubin M.D.   On: 02/15/2020 08:30   MR 3D Recon At Scanner  Result Date: 02/15/2020 CLINICAL DATA:  59 year old female with history of abdominal pain. Elevated bilirubin level. Status post laparoscopic cholecystectomy. Evaluate for biliary tract obstruction (retained stone in the common bile duct). EXAM: MRI ABDOMEN WITHOUT AND WITH CONTRAST (INCLUDING MRCP) TECHNIQUE: Multiplanar multisequence MR imaging of the abdomen was performed both before and after the administration of intravenous contrast. Heavily T2-weighted images of the biliary and pancreatic ducts were obtained, and three-dimensional MRCP images were rendered by post processing. CONTRAST:  41mL GADAVIST GADOBUTROL 1 MMOL/ML IV SOLN COMPARISON:  No prior abdominal MRI. CT the abdomen and pelvis 06/29/2019. FINDINGS: Lower chest: Dependent areas of signal intensity are noted in the lower lobes of the lungs bilaterally, which may reflect areas of atelectasis or consolidation (poorly evaluated by magnetic resonance). Hepatobiliary: No suspicious cystic or solid hepatic lesions. Status post cholecystectomy. Linear signal void in the gallbladder fossa, presumably a surgical drain. The tip of this drainage catheter extends into a small postoperative fluid collection in the gallbladder fossa which measures approximately 5.4 x 1.6 cm (axial image 26 of series 3). MRCP images demonstrate mild intrahepatic biliary ductal dilatation. There is also marked dilatation of the common bile duct which measures up to 18 mm in diameter in the porta hepatis. There are numerous filling defects within the common bile duct compatible with retained ductal stones. Pancreas: No pancreatic mass. Mild dilatation of the main pancreatic duct (4 mm). Small amount of peripancreatic increased T2 signal intensity compatible with trace amount of peripancreatic fluid/inflammation. No  well-defined pancreatic or peripancreatic fluid collections. Spleen:  Unremarkable. Adrenals/Urinary Tract: Numerous subcentimeter T1 hypointense, T2 hyperintense, nonenhancing lesions in both kidneys, compatible with tiny simple cysts. No suspicious renal lesions. Bilateral adrenal glands are normal in appearance. No hydroureteronephrosis in the visualized portions of the abdomen. Stomach/Bowel: Visualized portions are unremarkable. Vascular/Lymphatic: Aortic atherosclerosis, without evidence of aneurysm in the abdominal vasculature. No lymphadenopathy noted in the abdomen. Other: Small amount of increased T2 signal intensity in the retroperitoneum adjacent to the pancreas. No significant volume of ascites. Musculoskeletal: No aggressive appearing osseous lesions are noted in the visualized portions of the skeleton. IMPRESSION: 1. Status post cholecystectomy with surgical drain in the gallbladder fossa which appears to extend into a small postoperative fluid collection which likely represents a small postoperative seroma. 2. Severely dilated common bile duct with extensive choledocholithiasis filling the common bile duct. This is associated with both mild intrahepatic biliary ductal dilatation, and mild pancreatic ductal dilatation, as well as evidence of acute pancreatitis, as detailed above. 3. Dependent areas of signal intensity in the lower lobes of the lungs bilaterally favored to reflect some postoperative atelectasis, although some degree of airspace consolidation is not entirely excluded. Electronically Signed   By: Vinnie Langton M.D.   On: 02/15/2020 14:59   DG ERCP BILIARY & PANCREATIC  DUCTS  Result Date: 02/16/2020 CLINICAL DATA:  Abdominal pain, elevated bilirubin, post cholecystectomy, choledocholithiasis on MRCP EXAM: ERCP TECHNIQUE: Multiple spot images obtained with the fluoroscopic device and submitted for interpretation post-procedure. COMPARISON:  MR CP from the same day FINDINGS: Series  of fluoroscopic images document endoscopic cannulation and opacification of the CBD. Filling defects are noted in the distal CBD. Subsequent balloon catheter placement and balloon sweeps documented. There is incomplete opacification of the intrahepatic biliary tree which appears decompressed centrally. Cholecystectomy clips are noted. No evidence of extravasation. IMPRESSION: Endoscopic CBD cannulation and intervention as above. These images were submitted for radiologic interpretation only. Please see the procedural report for the amount of contrast and the fluoroscopy time utilized. Electronically Signed   By: Lucrezia Europe M.D.   On: 02/16/2020 11:08   MR ABDOMEN MRCP W WO CONTAST  Result Date: 02/15/2020 CLINICAL DATA:  59 year old female with history of abdominal pain. Elevated bilirubin level. Status post laparoscopic cholecystectomy. Evaluate for biliary tract obstruction (retained stone in the common bile duct). EXAM: MRI ABDOMEN WITHOUT AND WITH CONTRAST (INCLUDING MRCP) TECHNIQUE: Multiplanar multisequence MR imaging of the abdomen was performed both before and after the administration of intravenous contrast. Heavily T2-weighted images of the biliary and pancreatic ducts were obtained, and three-dimensional MRCP images were rendered by post processing. CONTRAST:  57mL GADAVIST GADOBUTROL 1 MMOL/ML IV SOLN COMPARISON:  No prior abdominal MRI. CT the abdomen and pelvis 06/29/2019. FINDINGS: Lower chest: Dependent areas of signal intensity are noted in the lower lobes of the lungs bilaterally, which may reflect areas of atelectasis or consolidation (poorly evaluated by magnetic resonance). Hepatobiliary: No suspicious cystic or solid hepatic lesions. Status post cholecystectomy. Linear signal void in the gallbladder fossa, presumably a surgical drain. The tip of this drainage catheter extends into a small postoperative fluid collection in the gallbladder fossa which measures approximately 5.4 x 1.6 cm  (axial image 26 of series 3). MRCP images demonstrate mild intrahepatic biliary ductal dilatation. There is also marked dilatation of the common bile duct which measures up to 18 mm in diameter in the porta hepatis. There are numerous filling defects within the common bile duct compatible with retained ductal stones. Pancreas: No pancreatic mass. Mild dilatation of the main pancreatic duct (4 mm). Small amount of peripancreatic increased T2 signal intensity compatible with trace amount of peripancreatic fluid/inflammation. No well-defined pancreatic or peripancreatic fluid collections. Spleen:  Unremarkable. Adrenals/Urinary Tract: Numerous subcentimeter T1 hypointense, T2 hyperintense, nonenhancing lesions in both kidneys, compatible with tiny simple cysts. No suspicious renal lesions. Bilateral adrenal glands are normal in appearance. No hydroureteronephrosis in the visualized portions of the abdomen. Stomach/Bowel: Visualized portions are unremarkable. Vascular/Lymphatic: Aortic atherosclerosis, without evidence of aneurysm in the abdominal vasculature. No lymphadenopathy noted in the abdomen. Other: Small amount of increased T2 signal intensity in the retroperitoneum adjacent to the pancreas. No significant volume of ascites. Musculoskeletal: No aggressive appearing osseous lesions are noted in the visualized portions of the skeleton. IMPRESSION: 1. Status post cholecystectomy with surgical drain in the gallbladder fossa which appears to extend into a small postoperative fluid collection which likely represents a small postoperative seroma. 2. Severely dilated common bile duct with extensive choledocholithiasis filling the common bile duct. This is associated with both mild intrahepatic biliary ductal dilatation, and mild pancreatic ductal dilatation, as well as evidence of acute pancreatitis, as detailed above. 3. Dependent areas of signal intensity in the lower lobes of the lungs bilaterally favored to reflect  some postoperative atelectasis, although some  degree of airspace consolidation is not entirely excluded. Electronically Signed   By: Vinnie Langton M.D.   On: 02/15/2020 14:59    Anti-infectives: Anti-infectives (From admission, onward)   Start     Dose/Rate Route Frequency Ordered Stop   02/17/20 0500  vancomycin (VANCOREADY) IVPB 1250 mg/250 mL     Discontinue     1,250 mg 166.7 mL/hr over 90 Minutes Intravenous Every 12 hours 02/16/20 1421     02/16/20 1430  vancomycin (VANCOREADY) IVPB 2000 mg/400 mL        2,000 mg 200 mL/hr over 120 Minutes Intravenous  Once 02/16/20 1416 02/16/20 1519   02/15/20 1900  meropenem (MERREM) 1 g in sodium chloride 0.9 % 100 mL IVPB     Discontinue     1 g 200 mL/hr over 30 Minutes Intravenous Every 8 hours 02/15/20 1803     02/15/20 1800  piperacillin-tazobactam (ZOSYN) IVPB 3.375 g  Status:  Discontinued        3.375 g 12.5 mL/hr over 240 Minutes Intravenous Every 6 hours 02/15/20 1701 02/15/20 1803   02/15/20 1700  meropenem (MERREM) 500 mg in sodium chloride 0.9 % 100 mL IVPB  Status:  Discontinued        500 mg 200 mL/hr over 30 Minutes Intravenous Every 8 hours 02/15/20 1656 02/15/20 1701   02/15/20 1000  piperacillin-tazobactam (ZOSYN) IVPB 3.375 g  Status:  Discontinued        3.375 g 12.5 mL/hr over 240 Minutes Intravenous Every 8 hours 02/15/20 0932 02/15/20 1656   02/12/20 1500  metroNIDAZOLE (FLAGYL) IVPB 500 mg        500 mg 100 mL/hr over 60 Minutes Intravenous Every 8 hours 02/12/20 1433 02/12/20 1701   02/12/20 1500  ciprofloxacin (CIPRO) IVPB 400 mg        400 mg 200 mL/hr over 60 Minutes Intravenous Every 12 hours 02/12/20 1433 02/12/20 1655   02/12/20 0600  vancomycin (VANCOREADY) IVPB 1500 mg/300 mL        1,500 mg 150 mL/hr over 120 Minutes Intravenous On call to O.R. 02/11/20 9357 02/12/20 1035       Assessment/Plan MS - bedbound  HTN Hx CHF AKI - resolving, Cr 1.05 ABL Anemia - stable at 8.3 Hyperglycemia - SSI.  A1c 5.2 R pleural effusion - On o2. Wean as able. Pulm toilet  LBBB - noted on workup. Hx of LBBB on EKG 1/17. Tn 27. No CP UTI - UCx resent Cortisol levels normal   Hx of Acute Cholecystitis tx w/ Perc Chole tube 07/2019 S/p Laparoscopic Cholecystectomy w/ drain placement by Dr. Kae Heller 02/12/2020 - POD #5 Sepsis w/ Choledocholithiasis and Cholangitis s/p ERCP 8/16 by Dr. Rush Landmark Gallstone Pancreatitis   Staph Bacteremia  - Appreciate the coordinated help of GI, CCM and TRH with her care - T bili downtrending at 2.5. LFT's ~stable. Lipase 183 - Cont abx   - Adv to soft diet  - Maintain drain - Monitor incisions  - PT - Pulm toilet  - Will keep in the unit this AM given patient require pressor support overnight. If stable off pressors today, may be able to transfer to the floor this PM  FEN - Soft, IVF (54ml/hr) VTE -SCDs, Lovenox ID -Cirpo/Flagyl peri-op. Zosyn 8/16 (allergy). Merrem 8/16 >> Vanc 8/17 >> + Staph blood cx's. Urine cx's resent. WBC 13   LOS: 2 days    Jillyn Ledger , [facility] 02/17/2020, 8:11 AM Please see Amion for  pager number during day hours 7:00am-4:30pm

## 2020-02-17 NOTE — Progress Notes (Signed)
Abdomen on right side where JP drain located noted to be more distended than yesterday and fluid from site noted to be be on dressing and bed pad. Dressing changed, Maczis PA called and up to see patient. No new orders. Will continue to monitor site.

## 2020-02-17 NOTE — Consult Note (Addendum)
[facility]ious Disease       Reason for Consult: bacteremia    Referring Physician: Dr. Tawanna Solo  Principal Problem:   Cholangitis from CBD obstruction s/p ERCP 02/15/2020 Active Problems:   Obesity   MS (multiple sclerosis) (HCC)   Chronic diastolic CHF (congestive heart failure) (HCC)   Paraplegia (HCC)   Insulin dependent diabetes mellitus type IA ([place])   Cholecystitis   Choledocholithiasis s/p ERCP 02/15/2020   . acetaminophen  1,000 mg Oral Q6H  . amantadine  100 mg Oral Daily  . baclofen  5 mg Oral QID  . buPROPion  150 mg Oral Daily  . Chlorhexidine Gluconate Cloth  6 each Topical Q0600  . docusate sodium  100 mg Oral BID  . DULoxetine  60 mg Oral Daily  . enoxaparin (LOVENOX) injection  40 mg Subcutaneous Q24H  . famotidine  20 mg Oral BID  . gabapentin  800 mg Oral TID  . insulin aspart  0-15 Units Subcutaneous TID WC  . lubiprostone  24 mcg Oral BID WC  . mouth rinse  15 mL Mouth Rinse BID  . melatonin  10 mg Oral QHS  . metoprolol succinate  150 mg Oral Daily  . mupirocin ointment  1 application Nasal BID  . pantoprazole  40 mg Oral Daily  . polyethylene glycol  17 g Oral Daily  . sodium chloride flush  10-40 mL Intracatheter Q12H  . spironolactone  25 mg Oral Daily    Recommendations: Continue with vancomycin and meropenem  Will repeat blood cultures  Assessment: She has bacteremia with coag negative Staph hominis in 3/4 bottles s/p lap chole with drain placement. Also with a history of ESBL organism and cholecocholithiasis on MRCP.  No current cultures from the biliary area but previous cultures with Enterococcus and E coli.    Antibiotics: Vancomycin and meropenem  HPI: Shannon Garrett is a 59 y.o. female with a history of cholecystitis noted during hospitalization in January with aborted surgery at that time due to significant inflammation and came in now with planned lap chole.  She had recently had a tube placed and did note  purulent aspirate.  Previous cultures as above and in January with ESBL E coli.  Fever curve has trended down from 103 to afebrile now over 48 hours.  WBC peaked at 22.7 and down to 13 now.  She feels ok.  Complaint of right sided abdominal pain.     Review of Systems:  Constitutional: negative for fevers and chills Integument/breast: negative for rash Musculoskeletal: negative for myalgias and arthralgias All other systems reviewed and are negative    Past Medical History:  Diagnosis Date  . Cardiomyopathy ([place])    a.  Echo 04/29/12: Mild LVH, EF 20-25%, mild AI, moderate MR, moderate LAE, mild RAE, mild RVE, moderate TR, PASP 51, small pericardial effusion;   b. probably non-ischemic given multiple chemo-Tx agents used for MS and global LV dysfn on echo  . Chronic systolic heart failure ([place])   . COVID-19 05/10/2019   Per facility paperwork  . Decubitus ulcer    stage II sacral decubitus ulcer.   . Depression   . Glaucoma   . Hypertension   . MS (multiple sclerosis) ([place])    a. Dx'd late 20's. b. Tx with Novantrone, Tysabri, Copaxone previously.    Social History   Tobacco Use  . Smoking status: Former Smoker    Packs/day: 0.50    Years: 5.00    Pack  years: 2.50  . Smokeless tobacco: Never Used  Vaping Use  . Vaping Use: Never used  Substance Use Topics  . Alcohol use: No  . Drug use: No    Family History  Problem Relation Age of Onset  . Hypertension Mother   . Cancer Mother        breast   . Cancer Father        prostate  . Multiple sclerosis Sister   . Heart attack Neg Hx     Allergies  Allergen Reactions  . Sulfonamide Derivatives Hives and Shortness Of Breath  . Penicillins Hives and Itching    Prior course of rocephin charted 05/2015 Also note has tolerated Zosyn in past 2016 and 2017    Physical Exam: Constitutional: in no apparent distress  Vitals:   02/17/20 1100 02/17/20 1200  BP:  (!) 149/70  Pulse: 99 95  Resp: (!) 22 10  Temp:  98.3 F  (36.8 C)  SpO2: 99% 96%   EYES: anicteric Cardiovascular: Cor RRR Respiratory: clear; GI: soft, + tenderness on right side with palpation, no guarding or rebound Musculoskeletal: no pedal edema noted Skin: negatives: no rash Neuro: non-focal  Lab Results  Component Value Date   WBC 13.0 (H) 02/17/2020   HGB 8.3 (L) 02/17/2020   HCT 25.5 (L) 02/17/2020   MCV 87.6 02/17/2020   PLT 237 02/17/2020    Lab Results  Component Value Date   CREATININE 1.05 (H) 02/17/2020   BUN 22 (H) 02/17/2020   NA 142 02/17/2020   K 3.9 02/17/2020   CL 113 (H) 02/17/2020   CO2 21 (L) 02/17/2020    Lab Results  Component Value Date   ALT 69 (H) 02/17/2020   AST 44 (H) 02/17/2020   ALKPHOS 412 (H) 02/17/2020     Microbiology: Recent Results (from the past 240 hour(s))  SARS Coronavirus 2 by RT PCR (hospital order, performed in [facility] hospital lab) Nasopharyngeal Nasopharyngeal Swab     Status: None   Collection Time: 02/12/20  7:14 AM   Specimen: Nasopharyngeal Swab  Result Value Ref Range Status   SARS Coronavirus 2 NEGATIVE NEGATIVE Final    Comment: (NOTE) SARS-CoV-2 target nucleic acids are NOT DETECTED.  The SARS-CoV-2 RNA is generally detectable in upper and lower respiratory specimens during the acute phase of infection. The lowest concentration of SARS-CoV-2 viral copies this assay can detect is 250 copies / mL. A negative result does not preclude SARS-CoV-2 infection and should not be used as the sole basis for treatment or other patient management decisions.  A negative result may occur with improper specimen collection / handling, submission of specimen other than nasopharyngeal swab, presence of viral mutation(s) within the areas targeted by this assay, and inadequate number of viral copies (<250 copies / mL). A negative result must be combined with clinical observations, patient history, and epidemiological information.  Fact Sheet for Patients:     StrictlyIdeas.no  Fact Sheet for Healthcare Providers: BankingDealers.co.za  This test is not yet approved or  cleared by the [place] FDA and has been authorized for detection and/or diagnosis of SARS-CoV-2 by FDA under an Emergency Use Authorization (EUA).  This EUA will remain in effect (meaning this test can be used) for the duration of the COVID-19 declaration under Section 564(b)(1) of the Act, 21 U.S.C. section 360bbb-3(b)(1), unless the authorization is terminated or revoked sooner.  Performed at [facility], [place] [address], [place], [place] [address]  Culture, blood (routine x 2)     Status: Abnormal (Preliminary result)   Collection Time: 02/15/20  7:42 AM   Specimen: BLOOD  Result Value Ref Range Status   Specimen Description   Final    BLOOD RIGHT ARM Performed at [facility] [address], [place], [place] [address]    Special Requests   Final    BOTTLES DRAWN AEROBIC AND ANAEROBIC Blood Culture adequate volume Performed at [facility] [address], [place], [place] [address]    Culture  Setup Time   Final    GRAM POSITIVE COCCI ANAEROBIC BOTTLE ONLY CRITICAL VALUE NOTED.  VALUE IS CONSISTENT WITH PREVIOUSLY REPORTED AND CALLED VALUE. Performed at [facility], [place] [address], [place], [place] [address]    Culture STAPHYLOCOCCUS HOMINIS (A)  Final   Report Status PENDING  Incomplete  Culture, blood (routine x 2)     Status: Abnormal (Preliminary result)   Collection Time: 02/15/20  7:51 AM   Specimen: BLOOD  Result Value Ref Range Status   Specimen Description   Final    BLOOD RIGHT ARM LOWER Performed at [facility] [address], [place], [place] [address]    Special Requests   Final    BOTTLES DRAWN AEROBIC AND ANAEROBIC Blood Culture adequate volume Performed at [facility] [address], [place], [place] [address]    Culture  Setup Time   Final    GRAM POSITIVE COCCI IN CLUSTERS IN BOTH AEROBIC AND ANAEROBIC BOTTLES CRITICAL RESULT CALLED TO, READ BACK BY AND VERIFIED WITH: PHARMD J. GADHIA 02/16/20 0918 FCP    Culture (A)  Final    STAPHYLOCOCCUS HOMINIS SUSCEPTIBILITIES TO FOLLOW Performed at [facility], [place] [address], [place], [place] [address]    Report Status PENDING  Incomplete  Blood Culture ID Panel (Reflexed)     Status: Abnormal   Collection Time: 02/15/20  7:51 AM  Result Value Ref Range Status   Enterococcus faecalis NOT DETECTED NOT DETECTED Final   Enterococcus Faecium NOT DETECTED NOT DETECTED Final   Listeria monocytogenes NOT DETECTED NOT DETECTED Final   Staphylococcus species DETECTED (A) NOT DETECTED Final    Comment: CRITICAL RESULT CALLED TO, READ BACK BY AND VERIFIED WITH: PHARMD J. GADHIA 02/16/20 0918 FCP    Staphylococcus aureus (BCID) NOT DETECTED NOT DETECTED Final   Staphylococcus epidermidis NOT DETECTED NOT DETECTED Final   Staphylococcus lugdunensis NOT DETECTED NOT DETECTED Final   Streptococcus species NOT DETECTED NOT DETECTED Final   Streptococcus agalactiae NOT DETECTED NOT DETECTED Final   Streptococcus pneumoniae NOT DETECTED NOT DETECTED Final   Streptococcus pyogenes NOT DETECTED NOT DETECTED Final   A.calcoaceticus-baumannii NOT DETECTED NOT DETECTED Final   Bacteroides fragilis NOT DETECTED NOT DETECTED Final   Enterobacterales NOT DETECTED NOT DETECTED Final   Enterobacter cloacae complex NOT DETECTED NOT DETECTED Final   Escherichia coli NOT DETECTED NOT DETECTED Final   Klebsiella aerogenes NOT DETECTED NOT DETECTED Final   Klebsiella oxytoca NOT DETECTED NOT DETECTED Final   Klebsiella pneumoniae NOT DETECTED NOT DETECTED Final   Proteus species NOT DETECTED NOT DETECTED Final   Salmonella species NOT DETECTED NOT DETECTED Final   Serratia marcescens NOT DETECTED NOT DETECTED Final   Haemophilus  influenzae NOT DETECTED NOT DETECTED Final   Neisseria meningitidis NOT DETECTED NOT DETECTED Final   Pseudomonas aeruginosa NOT DETECTED NOT DETECTED Final   Stenotrophomonas maltophilia NOT DETECTED NOT  DETECTED Final   Candida albicans NOT DETECTED NOT DETECTED Final   Candida auris NOT DETECTED NOT DETECTED Final   Candida glabrata NOT DETECTED NOT DETECTED Final   Candida krusei NOT DETECTED NOT DETECTED Final   Candida parapsilosis NOT DETECTED NOT DETECTED Final   Candida tropicalis NOT DETECTED NOT DETECTED Final   Cryptococcus neoformans/gattii NOT DETECTED NOT DETECTED Final    Comment: Performed at [facility], [place] [address], [place], [place] [address]  Culture, Urine     Status: Abnormal   Collection Time: 02/15/20  1:51 PM   Specimen: Urine, Clean Catch  Result Value Ref Range Status   Specimen Description   Final    URINE, CLEAN CATCH Performed at [facility], [place] [address] [place], [place] [address]    Special Requests   Final    NONE Performed at [facility], [place] [address], [place], [place] [address]    Culture (A)  Final    20,000 COLONIES/mL MULTIPLE SPECIES PRESENT, SUGGEST RECOLLECTION   Report Status 02/16/2020 FINAL  Final  MRSA PCR Screening     Status: Abnormal   Collection Time: 02/15/20  7:06 PM   Specimen: Nasal Mucosa; Nasopharyngeal  Result Value Ref Range Status   MRSA by PCR POSITIVE (A) NEGATIVE Final    Comment:        The GeneXpert MRSA Assay (FDA approved for NASAL specimens only), is one component of a comprehensive MRSA colonization surveillance program. It is not intended to diagnose MRSA infection nor to guide or monitor treatment for MRSA infections. RESULT CALLED TO, READ BACK BY AND VERIFIED WITH: RN B BROOKS AT 2317 02/15/20 CRUICKSHANK A Performed at [facility], [place] [address], [place], [place] [address]     Caya Soberanis W Andrea Ferrer, MD [facility] [email or web address] 02/17/2020, 2:46 PM

## 2020-02-17 NOTE — TOC Progression Note (Signed)
Transition of Care [facility]) - Progression Note    Patient Details  Name: Shannon Garrett MRN: [id number] Date of Birth: Nov 30, 1960  Transition of [facility]) CM/SW Contact  Leeroy Cha, RN Phone Number: 02/17/2020, 7:56 AM  Clinical Narrative:    Patient still has a.lines in place, iv pressors on hold, iv merrem, bun22 creat 1.05.wbc-13.0 Possible transition to med surg floor today. 'COVID-19 Vaccine Information can be found at: ShippingScam.co.uk For questions related to vaccine distribution or appointments, please email [email or web address] or call [phone].  .27    Expected Discharge Plan: [place] Barriers to Discharge: Continued Medical Work up  Expected Discharge Plan and Services Expected Discharge Plan: [place]   Discharge Planning Services: CM Consult Post Acute Care Choice: Resumption of Svcs/PTA Provider   Expected Discharge Date: 02/14/20               DME Arranged: N/A DME Agency: NA       HH Arranged: NA HH Agency: NA         Social Determinants of Health (SDOH) Interventions    Readmission Risk Interventions No flowsheet data found.

## 2020-02-18 DIAGNOSIS — K8309 Other cholangitis: Secondary | ICD-10-CM

## 2020-02-18 LAB — COMPREHENSIVE METABOLIC PANEL
ALT: 95 U/L — ABNORMAL HIGH (ref 0–44)
AST: 79 U/L — ABNORMAL HIGH (ref 15–41)
Albumin: 2.2 g/dL — ABNORMAL LOW (ref 3.5–5.0)
Alkaline Phosphatase: 461 U/L — ABNORMAL HIGH (ref 38–126)
Anion gap: 5 (ref 5–15)
BUN: 15 mg/dL (ref 6–20)
CO2: 20 mmol/L — ABNORMAL LOW (ref 22–32)
Calcium: 7.9 mg/dL — ABNORMAL LOW (ref 8.9–10.3)
Chloride: 120 mmol/L — ABNORMAL HIGH (ref 98–111)
Creatinine, Ser: 0.77 mg/dL (ref 0.44–1.00)
GFR calc Af Amer: >60 mL/min (ref >60)
GFR calc non Af Amer: >60 mL/min (ref >60)
Glucose, Bld: 132 mg/dL — ABNORMAL HIGH (ref 70–99)
Potassium: 3.6 mmol/L (ref 3.5–5.1)
Sodium: 145 mmol/L (ref 135–145)
Total Bilirubin: 1.6 mg/dL — ABNORMAL HIGH (ref 0.3–1.2)
Total Protein: 5.1 g/dL — ABNORMAL LOW (ref 6.5–8.1)

## 2020-02-18 LAB — CBC WITH DIFFERENTIAL/PLATELET
Abs Immature Granulocytes: 0.1 10*3/uL — ABNORMAL HIGH (ref 0.00–0.07)
Basophils Absolute: 0 10*3/uL (ref 0.0–0.1)
Basophils Relative: 1 %
Eosinophils Absolute: 0.5 10*3/uL (ref 0.0–0.5)
Eosinophils Relative: 7 %
HCT: 25.2 % — ABNORMAL LOW (ref 36.0–46.0)
Hemoglobin: 8.3 g/dL — ABNORMAL LOW (ref 12.0–15.0)
Immature Granulocytes: 1 %
Lymphocytes Relative: 12 %
Lymphs Abs: 0.8 10*3/uL (ref 0.7–4.0)
MCH: 28.2 pg (ref 26.0–34.0)
MCHC: 32.9 g/dL (ref 30.0–36.0)
MCV: 85.7 fL (ref 80.0–100.0)
Monocytes Absolute: 0.6 10*3/uL (ref 0.1–1.0)
Monocytes Relative: 8 %
Neutro Abs: 5.2 10*3/uL (ref 1.7–7.7)
Neutrophils Relative %: 71 %
Platelets: 249 10*3/uL (ref 150–400)
RBC: 2.94 MIL/uL — ABNORMAL LOW (ref 3.87–5.11)
RDW: 15.3 % (ref 11.5–15.5)
WBC: 7.3 10*3/uL (ref 4.0–10.5)
nRBC: 0 % (ref 0.0–0.2)

## 2020-02-18 LAB — GLUCOSE, CAPILLARY
Glucose-Capillary: 105 mg/dL — ABNORMAL HIGH (ref 70–99)
Glucose-Capillary: 96 mg/dL (ref 70–99)
Glucose-Capillary: 115 mg/dL — ABNORMAL HIGH (ref 70–99)
Glucose-Capillary: 197 mg/dL — ABNORMAL HIGH (ref 70–99)
Glucose-Capillary: 134 mg/dL — ABNORMAL HIGH (ref 70–99)

## 2020-02-18 LAB — URINE CULTURE: Culture: <10000 — AB

## 2020-02-18 MED ORDER — HYDROCORTISONE 1 % EX OINT
1.0000 "application " | TOPICAL_OINTMENT | Freq: Two times a day (BID) | CUTANEOUS | Status: DC
  Filled 2020-02-18: qty 28.35

## 2020-02-18 MED ORDER — HYDROCORTISONE 1 % EX OINT
1.0000 "application " | TOPICAL_OINTMENT | Freq: Two times a day (BID) | CUTANEOUS | Status: DC
  Administered 2020-02-18 – 2020-02-20 (×5): 1 via TOPICAL
  Filled 2020-02-18: qty 28

## 2020-02-18 MED ORDER — POTASSIUM CHLORIDE 20 MEQ PO PACK
40.0000 meq | PACK | Freq: Once | ORAL | Status: AC
  Administered 2020-02-18: 13:00:00 40 meq via ORAL
  Filled 2020-02-18: qty 2

## 2020-02-18 MED ORDER — ACETAMINOPHEN 500 MG PO TABS: 1000.0000 mg | ORAL_TABLET | Freq: Four times a day (QID) | ORAL | Status: DC | PRN

## 2020-02-18 MED ORDER — TRAMADOL HCL 50 MG PO TABS
50.0000 mg | ORAL_TABLET | Freq: Four times a day (QID) | ORAL | Status: DC | PRN
  Administered 2020-02-18 – 2020-02-19 (×2): 100 mg via ORAL
  Filled 2020-02-18 (×2): qty 2

## 2020-02-18 NOTE — Progress Notes (Signed)
[facility] for Infectious Disease   Reason for visit: Follow up on bacteremia  Interval History: she feels well, is currently eating, WBC wnl, afebrile. No complaints, asking about discharge. Staph hominis pan sensitive.     Physical Exam: Constitutional:  Vitals:   02/18/20 1113 02/18/20 1353  BP: 140/75 133/78  Pulse: 95 93  Resp: 17 16  Temp: 99.1 F (37.3 C) 97.8 F (36.6 C)  SpO2: 99% 100%   patient appears in NAD Respiratory: Normal respiratory effort; CTA B Cardiovascular: RRR GI: soft  Review of Systems: Constitutional: negative for fevers and chills Gastrointestinal: negative for nausea  Lab Results  Component Value Date   WBC 7.3 02/18/2020   HGB 8.3 (L) 02/18/2020   HCT 25.2 (L) 02/18/2020   MCV 85.7 02/18/2020   PLT 249 02/18/2020    Lab Results  Component Value Date   CREATININE 0.77 02/18/2020   BUN 15 02/18/2020   NA 145 02/18/2020   K 3.6 02/18/2020   CL 120 (H) 02/18/2020   CO2 20 (L) 02/18/2020    Lab Results  Component Value Date   ALT 95 (H) 02/18/2020   AST 79 (H) 02/18/2020   ALKPHOS 461 (H) 02/18/2020     Microbiology: Recent Results (from the past 240 hour(s))  SARS Coronavirus 2 by RT PCR (hospital order, performed in [facility] hospital lab) Nasopharyngeal Nasopharyngeal Swab     Status: None   Collection Time: 02/12/20  7:14 AM   Specimen: Nasopharyngeal Swab  Result Value Ref Range Status   SARS Coronavirus 2 NEGATIVE NEGATIVE Final    Comment: (NOTE) SARS-CoV-2 target nucleic acids are NOT DETECTED.  The SARS-CoV-2 RNA is generally detectable in upper and lower respiratory specimens during the acute phase of infection. The lowest concentration of SARS-CoV-2 viral copies this assay can detect is 250 copies / mL. A negative result does not preclude SARS-CoV-2 infection and should not be used as the sole basis for treatment or other patient management decisions.  A negative result may occur with improper specimen  collection / handling, submission of specimen other than nasopharyngeal swab, presence of viral mutation(s) within the areas targeted by this assay, and inadequate number of viral copies (<250 copies / mL). A negative result must be combined with clinical observations, patient history, and epidemiological information.  Fact Sheet for Patients:   StrictlyIdeas.no  Fact Sheet for Healthcare Providers: BankingDealers.co.za  This test is not yet approved or  cleared by the [place] FDA and has been authorized for detection and/or diagnosis of SARS-CoV-2 by FDA under an Emergency Use Authorization (EUA).  This EUA will remain in effect (meaning this test can be used) for the duration of the COVID-19 declaration under Section 564(b)(1) of the Act, 21 U.S.C. section 360bbb-3(b)(1), unless the authorization is terminated or revoked sooner.  Performed at [facility], [place] [address] [place], [place] [address]   Culture, blood (routine x 2)     Status: Abnormal (Preliminary result)   Collection Time: 02/15/20  7:42 AM   Specimen: BLOOD  Result Value Ref Range Status   Specimen Description   Final    BLOOD RIGHT ARM Performed at [facility] [address], [place], [place] [address]    Special Requests   Final    BOTTLES DRAWN AEROBIC AND ANAEROBIC Blood Culture adequate volume Performed at [facility] [address], [place], [place] [address]    Culture  Setup Time  Final    GRAM POSITIVE COCCI ANAEROBIC BOTTLE ONLY CRITICAL VALUE NOTED.  VALUE IS CONSISTENT WITH PREVIOUSLY REPORTED AND CALLED VALUE.    Culture (A)  Final    STAPHYLOCOCCUS HOMINIS SUSCEPTIBILITIES TO FOLLOW Performed at [facility], [place] [address], [place], [place] [address]    Report Status PENDING  Incomplete  Culture, blood (routine x 2)     Status: Abnormal   Collection Time: 02/15/20   7:51 AM   Specimen: BLOOD  Result Value Ref Range Status   Specimen Description   Final    BLOOD RIGHT ARM LOWER Performed at [facility] [address], [place], [place] [address]    Special Requests   Final    BOTTLES DRAWN AEROBIC AND ANAEROBIC Blood Culture adequate volume Performed at [facility] [address], [place], [place] [address]    Culture  Setup Time   Final    GRAM POSITIVE COCCI IN CLUSTERS IN BOTH AEROBIC AND ANAEROBIC BOTTLES CRITICAL RESULT CALLED TO, READ BACK BY AND VERIFIED WITH: PHARMD J. GADHIA 02/16/20 0918 FCP Performed at [facility], [place] [address], [place], [place] [address]    Culture STAPHYLOCOCCUS HOMINIS (A)  Final   Report Status 02/18/2020 FINAL  Final   Organism ID, Bacteria STAPHYLOCOCCUS HOMINIS  Final      Susceptibility   Staphylococcus hominis - MIC*    CIPROFLOXACIN <=0.5 SENSITIVE Sensitive     ERYTHROMYCIN <=0.25 SENSITIVE Sensitive     GENTAMICIN <=0.5 SENSITIVE Sensitive     OXACILLIN <=0.25 SENSITIVE Sensitive     TETRACYCLINE 2 SENSITIVE Sensitive     VANCOMYCIN 1 SENSITIVE Sensitive     TRIMETH/SULFA <=10 SENSITIVE Sensitive     CLINDAMYCIN <=0.25 SENSITIVE Sensitive     RIFAMPIN <=0.5 SENSITIVE Sensitive     Inducible Clindamycin NEGATIVE Sensitive     * STAPHYLOCOCCUS HOMINIS  Blood Culture ID Panel (Reflexed)     Status: Abnormal   Collection Time: 02/15/20  7:51 AM  Result Value Ref Range Status   Enterococcus faecalis NOT DETECTED NOT DETECTED Final   Enterococcus Faecium NOT DETECTED NOT DETECTED Final   Listeria monocytogenes NOT DETECTED NOT DETECTED Final   Staphylococcus species DETECTED (A) NOT DETECTED Final    Comment: CRITICAL RESULT CALLED TO, READ BACK BY AND VERIFIED WITH: PHARMD J. GADHIA 02/16/20 0918 FCP    Staphylococcus aureus (BCID) NOT DETECTED NOT DETECTED Final   Staphylococcus epidermidis NOT DETECTED NOT DETECTED Final   Staphylococcus  lugdunensis NOT DETECTED NOT DETECTED Final   Streptococcus species NOT DETECTED NOT DETECTED Final   Streptococcus agalactiae NOT DETECTED NOT DETECTED Final   Streptococcus pneumoniae NOT DETECTED NOT DETECTED Final   Streptococcus pyogenes NOT DETECTED NOT DETECTED Final   A.calcoaceticus-baumannii NOT DETECTED NOT DETECTED Final   Bacteroides fragilis NOT DETECTED NOT DETECTED Final   Enterobacterales NOT DETECTED NOT DETECTED Final   Enterobacter cloacae complex NOT DETECTED NOT DETECTED Final   Escherichia coli NOT DETECTED NOT DETECTED Final   Klebsiella aerogenes NOT DETECTED NOT DETECTED Final   Klebsiella oxytoca NOT DETECTED NOT DETECTED Final   Klebsiella pneumoniae NOT DETECTED NOT DETECTED Final   Proteus species NOT DETECTED NOT DETECTED Final   Salmonella species NOT DETECTED NOT DETECTED Final   Serratia marcescens NOT DETECTED NOT DETECTED Final   Haemophilus influenzae NOT DETECTED NOT DETECTED Final   Neisseria meningitidis NOT DETECTED NOT DETECTED Final   Pseudomonas aeruginosa NOT DETECTED NOT DETECTED Final  Stenotrophomonas maltophilia NOT DETECTED NOT DETECTED Final   Candida albicans NOT DETECTED NOT DETECTED Final   Candida auris NOT DETECTED NOT DETECTED Final   Candida glabrata NOT DETECTED NOT DETECTED Final   Candida krusei NOT DETECTED NOT DETECTED Final   Candida parapsilosis NOT DETECTED NOT DETECTED Final   Candida tropicalis NOT DETECTED NOT DETECTED Final   Cryptococcus neoformans/gattii NOT DETECTED NOT DETECTED Final    Comment: Performed at [facility], [place] [address] [place], [place] [address]  Culture, Urine     Status: Abnormal   Collection Time: 02/15/20  1:51 PM   Specimen: Urine, Clean Catch  Result Value Ref Range Status   Specimen Description   Final    URINE, CLEAN CATCH Performed at [facility], [place] [address] [place], [place] [address]    Special Requests   Final    NONE Performed at [facility], [place] [address] [place], [place] [address]    Culture (A)  Final    20,000 COLONIES/mL MULTIPLE SPECIES PRESENT, SUGGEST RECOLLECTION   Report Status 02/16/2020 FINAL  Final  MRSA PCR Screening     Status: Abnormal   Collection Time: 02/15/20  7:06 PM   Specimen: Nasal Mucosa; Nasopharyngeal  Result Value Ref Range Status   MRSA by PCR POSITIVE (A) NEGATIVE Final    Comment:        The GeneXpert MRSA Assay (FDA approved for NASAL specimens only), is one component of a comprehensive MRSA colonization surveillance program. It is not intended to diagnose MRSA infection nor to guide or monitor treatment for MRSA infections. RESULT CALLED TO, READ BACK BY AND VERIFIED WITH: RN B BROOKS AT 2317 02/15/20 CRUICKSHANK A Performed at [facility], [place] [address] [place], [place] [address]   Culture, Urine     Status: Abnormal   Collection Time: 02/17/20  2:33 PM   Specimen: Urine, Random  Result Value Ref Range Status   Specimen Description   Final    URINE, RANDOM Performed at [facility] [address], [place], [place] [address]    Special Requests   Final    NONE Performed at [facility], [place] [address] [place], [place] [address]    Culture (A)  Final    <10,000 COLONIES/mL INSIGNIFICANT GROWTH Performed at [facility] [address] [place], [place] [address]    Report Status 02/18/2020 FINAL  Final    Impression/Plan:  1. Bacteremia - she continues to be overall improved and now on the medical floor out of the ICU.  She has not had any fever since 8/16 and WBC wnl.   Previous cultures with E coli and Enterococcus and in March was ESBL. She is s/p lap chole and then choledocholithiasis and cholangitis s/p ERCP.  At this point, she is doing well, the bacteremia is a coag negative Staph, though likely some element of multifocal organisms.  She can continue with the meropenem to cover  broadly including the Staph. With removal of the sources, she will not need prolonged antibiotics and can be discharged off of antibiotics which looks to be in a few days.   Vancomycin has been stopped  2.  Renal insufficiency - improved and today creat is wnl.  Will continue to monitor.

## 2020-02-18 NOTE — Progress Notes (Addendum)
3 Days Post-Op  Subjective: CC: Doing well. Did not require pressors overnight. Tolerating soft diet (had pizza for dinner last night) without increased abdominal pain or n/v. Having some epigastric and ruq abdominal pain still along with suprapubic discomfort and dysuria.   Objective: Vital signs in last 24 hours: Temp:  [98.3 F (36.8 C)-98.6 F (37 C)] 98.6 F (37 C) (08/19 0400) Pulse Rate:  [93-114] 105 (08/19 0400) Resp:  [10-28] 10 (08/19 0400) BP: (119-166)/(59-89) 119/89 (08/19 0400) SpO2:  [96 %-100 %] 98 % (08/19 0400) Arterial Line BP: (112-157)/(54-70) 139/70 (08/19 0400) Last BM Date: 02/12/20  Intake/Output from previous day: 08/18 0701 - 08/19 0700 In: 1161.3 [P.O.:240; I.V.:473.3; IV Piggyback:448.1] Out: 2170 [Urine:2150; Drains:20] Intake/Output this shift: No intake/output data recorded.  PE: Gen: Alert, NAD, pleasant Card: RRR Pulm: CTA to upper lung fields. Distant at bases. Normal rate and effort. On o2. Abd: Soft,RUQandepigastric tenderness. No peritonitis.+BS.Incisions with glue intact appears well and are without drainage, bleeding. There is some blanchable erythema surrounding without heat, induration, fluctuance, or drainage. See picture below. Monitor incisions. JP drainw/brown tinged serous (likely surgicel).  Ext: No LE edema or calf tenderness. Chronic LE contractures. SCDs in place Skin: no rashes noted, warm and dry      Lab Results:  Recent Labs    02/17/20 0206 02/18/20 0600  WBC 13.0* 7.3  HGB 8.3* 8.3*  HCT 25.5* 25.2*  PLT 237 249   BMET Recent Labs    02/17/20 0206 02/18/20 0600  NA 142 145  K 3.9 3.6  CL 113* 120*  CO2 21* 20*  GLUCOSE 125* 132*  BUN 22* 15  CREATININE 1.05* 0.77  CALCIUM 7.7* 7.9*   PT/INR Recent Labs    02/15/20 1708  LABPROT 14.0  INR 1.1   CMP     Component Value Date/Time   NA 145 02/18/2020 0600   NA 144 10/26/2019 1100   K 3.6 02/18/2020 0600   CL 120 (H)  02/18/2020 0600   CO2 20 (L) 02/18/2020 0600   GLUCOSE 132 (H) 02/18/2020 0600   BUN 15 02/18/2020 0600   BUN 21 10/26/2019 1100   CREATININE 0.77 02/18/2020 0600   CALCIUM 7.9 (L) 02/18/2020 0600   PROT 5.1 (L) 02/18/2020 0600   PROT 6.4 08/26/2018 0959   ALBUMIN 2.2 (L) 02/18/2020 0600   ALBUMIN 3.9 08/26/2018 0959   AST 79 (H) 02/18/2020 0600   ALT 95 (H) 02/18/2020 0600   ALKPHOS 461 (H) 02/18/2020 0600   BILITOT 1.6 (H) 02/18/2020 0600   BILITOT 0.2 08/26/2018 0959   GFRNONAA >60 02/18/2020 0600   GFRAA >60 02/18/2020 0600   Lipase     Component Value Date/Time   LIPASE 183 (H) 02/17/2020 0206       Studies/Results: No results found.  Anti-infectives: Anti-infectives (From admission, onward)   Start     Dose/Rate Route Frequency Ordered Stop   02/17/20 0500  vancomycin (VANCOREADY) IVPB 1250 mg/250 mL        1,250 mg 166.7 mL/hr over 90 Minutes Intravenous Every 12 hours 02/16/20 1421     02/16/20 1430  vancomycin (VANCOREADY) IVPB 2000 mg/400 mL        2,000 mg 200 mL/hr over 120 Minutes Intravenous  Once 02/16/20 1416 02/16/20 1519   02/15/20 1900  meropenem (MERREM) 1 g in sodium chloride 0.9 % 100 mL IVPB        1 g 200 mL/hr over 30 Minutes Intravenous Every 8 hours  02/15/20 1803     02/15/20 1800  piperacillin-tazobactam (ZOSYN) IVPB 3.375 g  Status:  Discontinued        3.375 g 12.5 mL/hr over 240 Minutes Intravenous Every 6 hours 02/15/20 1701 02/15/20 1803   02/15/20 1700  meropenem (MERREM) 500 mg in sodium chloride 0.9 % 100 mL IVPB  Status:  Discontinued        500 mg 200 mL/hr over 30 Minutes Intravenous Every 8 hours 02/15/20 1656 02/15/20 1701   02/15/20 1000  piperacillin-tazobactam (ZOSYN) IVPB 3.375 g  Status:  Discontinued        3.375 g 12.5 mL/hr over 240 Minutes Intravenous Every 8 hours 02/15/20 0932 02/15/20 1656   02/12/20 1500  metroNIDAZOLE (FLAGYL) IVPB 500 mg        500 mg 100 mL/hr over 60 Minutes Intravenous Every 8 hours  02/12/20 1433 02/12/20 1701   02/12/20 1500  ciprofloxacin (CIPRO) IVPB 400 mg        400 mg 200 mL/hr over 60 Minutes Intravenous Every 12 hours 02/12/20 1433 02/12/20 1655   02/12/20 0600  vancomycin (VANCOREADY) IVPB 1500 mg/300 mL        1,500 mg 150 mL/hr over 120 Minutes Intravenous On call to O.R. 02/11/20 5498 02/12/20 1035       Assessment/Plan MS - bedbound  HTN Hx CHF AKI - resolved ABL Anemia - stable at 8.3 Hyperglycemia - SSI. A1c 5.2 R pleural effusion - On o2. Wean as able. Pulm toilet  LBBB - noted on workup. Hx of LBBB on EKG 1/17. Tn 27. No CP UTI - UCx resent Cortisol levels normal   Hx of Acute Cholecystitis tx w/ Perc Chole tube 07/2019 S/p Laparoscopic Cholecystectomy w/ drain placement by Dr. Kae Heller 02/12/2020- POD #6 Sepsis w/ Choledocholithiasis and Cholangitis s/p ERCP 8/16 by Dr. Rush Landmark Gallstone Pancreatitis Staph Hominis Bacteremia  - Appreciate the coordinated help of GI, CCM and TRH with her care - T bili downtrending at 1.6. LFT's slightly up. Lipase downtrending. - Cont abx. Per ID. Appreciate their assistance.  - Maintain drain - Monitor incisions  - PT - Pulm toilet - Transfer out of the unit. D/c central line   FEN - Soft, IVF (20ml/hr) VTE -SCDs, Lovenox ID -Cirpo/Flagyl peri-op. Zosyn 8/16(allergy). Merrem 8/16 >> Vanc 8/17 >> + Staph hominis blood cx's. Urine cx's resent. WBC 7.3   LOS: 3 days    Jillyn Ledger , [facility] 02/18/2020, 8:15 AM Please see Amion for pager number during day hours 7:00am-4:30pm

## 2020-02-18 NOTE — Progress Notes (Signed)
PROGRESS NOTE  Shannon Garrett UDT:[id number] DOB: 03-Jan-1961 DOA: 02/12/2020 PCP: Kirt Boys, DO  Brief History   Shannon Garrett an 59 y.o.femalewith past medical history systolic congestive heart failure,depression, HTN ,COVID-19 infection in 05/2019, hypertension, multiple sclerosiswhowas admitted here under general surgery servicefor lapcholecystectomy. She was admitted here in January this year with cholecystitis. At the time cholecystectomy was not possible and she underwent percutaneous cholecystostomy tube placement. She underwent lap chole with drain placement on 02/12/2020 and was planned for skilled nursing facility placement but she developed fever, tachycardia and hypotension overnight with elevated white cell counts, elevated lipase, elevated LFTs. Sepsis secondary to possiblecholangitis was suspected. Started on broad-spectrum antibiotics, blood culture sent.  She underwent urgent ERCP by GI on 02/15/2020. Post procedure, she became hypotensive and had to be transferred to ICU and was started on Levophed. Currently hemodynamically stable.  Consultants   General surgery  Infectious disease  Gastroenterology  Procedures   ERCP  Laparascopic cholecystectomy  Antibiotics   Anti-infectives (From admission, onward)   Start     Dose/Rate Route Frequency Ordered Stop   02/17/20 0500  vancomycin (VANCOREADY) IVPB 1250 mg/250 mL  Status:  Discontinued        1,250 mg 166.7 mL/hr over 90 Minutes Intravenous Every 12 hours 02/16/20 1421 02/18/20 1341   02/16/20 1430  vancomycin (VANCOREADY) IVPB 2000 mg/400 mL        2,000 mg 200 mL/hr over 120 Minutes Intravenous  Once 02/16/20 1416 02/16/20 1519   02/15/20 1900  meropenem (MERREM) 1 g in sodium chloride 0.9 % 100 mL IVPB        1 g 200 mL/hr over 30 Minutes Intravenous Every 8 hours 02/15/20 1803 02/20/20 2359   02/15/20 1800  piperacillin-tazobactam (ZOSYN) IVPB 3.375 g  Status:  Discontinued        3.375  g 12.5 mL/hr over 240 Minutes Intravenous Every 6 hours 02/15/20 1701 02/15/20 1803   02/15/20 1700  meropenem (MERREM) 500 mg in sodium chloride 0.9 % 100 mL IVPB  Status:  Discontinued        500 mg 200 mL/hr over 30 Minutes Intravenous Every 8 hours 02/15/20 1656 02/15/20 1701   02/15/20 1000  piperacillin-tazobactam (ZOSYN) IVPB 3.375 g  Status:  Discontinued        3.375 g 12.5 mL/hr over 240 Minutes Intravenous Every 8 hours 02/15/20 0932 02/15/20 1656   02/12/20 1500  metroNIDAZOLE (FLAGYL) IVPB 500 mg        500 mg 100 mL/hr over 60 Minutes Intravenous Every 8 hours 02/12/20 1433 02/12/20 1701   02/12/20 1500  ciprofloxacin (CIPRO) IVPB 400 mg        400 mg 200 mL/hr over 60 Minutes Intravenous Every 12 hours 02/12/20 1433 02/12/20 1655   02/12/20 0600  vancomycin (VANCOREADY) IVPB 1500 mg/300 mL        1,500 mg 150 mL/hr over 120 Minutes Intravenous On call to O.R. 02/11/20 9728 02/12/20 1035         Subjective  The patient is resting comfortably. No new complaints.  Objective   Vitals:  Vitals:   02/18/20 1113 02/18/20 1353  BP: 140/75 133/78  Pulse: 95 93  Resp: 17 16  Temp: 99.1 F (37.3 C) 97.8 F (36.6 C)  SpO2: 99% 100%   Exam:  Constitutional:   The patient is awake, alert, and oriented x 3. No acute distress. Respiratory:   No increased work of breathing.  No wheezes, rales, or rhonchi  No tactile  fremitus Cardiovascular:   Regular rate and rhythm  No murmurs, ectopy, or gallups.  No lateral PMI. No thrills. Abdomen:   Abdomen is soft, non-tender, non-distended  No hernias, masses, or organomegaly  Normoactive bowel sounds.  Musculoskeletal:   No cyanosis, clubbing, or edema Skin:   No rashes, lesions, ulcers  palpation of skin: no induration or nodules Neurologic:   CN 2-12 intact  Sensation all 4 extremities intact Psychiatric:   Mental status o Mood, affect appropriate o Orientation to person, place, time    judgment and insight appear intact  I have personally reviewed the following:   Today's Data   Vitals, CBC, CMP  Micro Data   Urine culture: Insignificant growth  Blood cultures x 2 (02/15/2020) 2/2 growth of staph hominis. Pan sensitive.  Blood cultures x 2 (02/18/2020) No growth  Imaging   CXR  Cardiology Data   None  Scheduled Meds:  amantadine  100 mg Oral Daily   baclofen  5 mg Oral QID   buPROPion  150 mg Oral Daily   Chlorhexidine Gluconate Cloth  6 each Topical Q0600   docusate sodium  100 mg Oral BID   DULoxetine  60 mg Oral Daily   enoxaparin (LOVENOX) injection  40 mg Subcutaneous Q24H   famotidine  20 mg Oral BID   gabapentin  800 mg Oral TID   hydrocortisone  1 application Topical BID   insulin aspart  0-15 Units Subcutaneous TID WC   lubiprostone  24 mcg Oral BID WC   mouth rinse  15 mL Mouth Rinse BID   melatonin  10 mg Oral QHS   metoprolol succinate  150 mg Oral Daily   mupirocin ointment  1 application Nasal BID   pantoprazole  40 mg Oral Daily   polyethylene glycol  17 g Oral Daily   sodium chloride flush  10-40 mL Intracatheter Q12H   spironolactone  25 mg Oral Daily   Continuous Infusions:  sodium chloride     meropenem (MERREM) IV Stopped (02/18/20 0715)    Principal Problem:   Cholangitis from CBD obstruction s/p ERCP 02/15/2020 Active Problems:   Obesity   MS (multiple sclerosis) (HCC)   Chronic diastolic CHF (congestive heart failure) (HCC)   Paraplegia (HCC)   Insulin dependent diabetes mellitus type IA ([place])   Cholecystitis   Choledocholithiasis s/p ERCP 02/15/2020   LOS: 3 days   A & P  Septic shock/cholangitis/CBD stones:Status post lap cholecystectomywith drain placment.Met septic shock criteria withfever, hypotension, tachycardia, leukocytosis . Elevated lipase and liver enzymes.MRCP showed severely dilated common bile duct with extensive choledocholithiasis filling the common bile  duct,associated mild intrahepatic biliary ductal dilatation, and mild pancreatic ductal dilatation, as well as evidence of acute pancreatitis. ERCP done on 02/17/2020 showed edematous ulcerated major papula, dilated biliary tree with multiple stones and sludge. Successful complete removal of the stones with biliary sphincterectomy and balloon sphincteroplasty. Patient was septic on last admission when she was admitted for cholecystitis,her bile cultures at that time showed ESBL E. coli and Enterococcus and she was treated with antibiotics. She was discharged on cholecystostomy drain. Blood pressure stable today but got report that she was hypotensive last night. Blood cultures showed staphylococcal hominis. On vanco and merem.Consulted ID,talked to Dr Linus Salmons who will follow. Continuing monitoring CBC,CMP.  Acute hypoxic respiratory failure:Currently on 2 L of oxygen per minute. Chest x-ray showed trace right pleural effusion,continue to attempt to wean the oxygen.This ismost likely from atelectasis from recent surgery.  History of chronic  systolic congestive heart failure:Echo done on 1/21 showed EF of 40 to 45%, global hypokinesis,moderately elevated pulmonary artery pressure.She is on spironolactoneat home. Cardiac cath done on 4/21 showed nonischemic cardiomyopathy, angiographically normal coronary arteries.  Insulin-dependent diabetes: Monitor blood sugars. Currently on sliding scale insulin. Last hemoglobin A1C was 7.6.  Morbid obesity:BMI of 42.8.  Elevated troponin: Most likely this is from sepsis. No further work-up necessary.  Normocytic anemia:Currently hemoglobin stable. Continue to monitor  Debility/deconditioning/multiple sclerosis:Patient has history of multiple sclerosis and paraplegic,bed bound. Planfor skilled nursing facility placement, she is long-term nursing homeresident.On amantadine, baclofen  Depression: On bupropion,  duloxetine  Hypertension:  BP stable this mrng.  Home medications on hold  I have seen and examined this patient myself. I have spent 34 minutes in her evaluation and care.  DVT prophylaxis:lovenox Code Status: DNR Family Communication: None present at the bedside Disposition:  Status is: Inpatient  Remains inpatient appropriate because:IV treatments appropriate due to intensity of illness or inability to take PO   Dispo: The patient is from: Home              Anticipated d/c is to: Home              Anticipated d/c date is: 2 days              Patient currently is not medically stable to d/c.  Mame Twombly, DO [facility] Direct contact: see [email or web address]  7PM-7AM contact night coverage as above 02/18/2020, 1:23 PM  LOS: 3 days

## 2020-02-19 LAB — COMPREHENSIVE METABOLIC PANEL
ALT: 101 U/L — ABNORMAL HIGH (ref 0–44)
AST: 59 U/L — ABNORMAL HIGH (ref 15–41)
Albumin: 2.6 g/dL — ABNORMAL LOW (ref 3.5–5.0)
Alkaline Phosphatase: 641 U/L — ABNORMAL HIGH (ref 38–126)
Anion gap: 9 (ref 5–15)
BUN: 15 mg/dL (ref 6–20)
CO2: 18 mmol/L — ABNORMAL LOW (ref 22–32)
Calcium: 8.8 mg/dL — ABNORMAL LOW (ref 8.9–10.3)
Chloride: 119 mmol/L — ABNORMAL HIGH (ref 98–111)
Creatinine, Ser: 0.64 mg/dL (ref 0.44–1.00)
GFR calc Af Amer: >60 mL/min (ref >60)
GFR calc non Af Amer: >60 mL/min (ref >60)
Glucose, Bld: 87 mg/dL (ref 70–99)
Potassium: 4.2 mmol/L (ref 3.5–5.1)
Sodium: 146 mmol/L — ABNORMAL HIGH (ref 135–145)
Total Bilirubin: 1.6 mg/dL — ABNORMAL HIGH (ref 0.3–1.2)
Total Protein: 5.4 g/dL — ABNORMAL LOW (ref 6.5–8.1)

## 2020-02-19 LAB — LIPASE, BLOOD: Lipase: 41 U/L (ref 11–51)

## 2020-02-19 LAB — GLUCOSE, CAPILLARY
Glucose-Capillary: 73 mg/dL (ref 70–99)
Glucose-Capillary: 114 mg/dL — ABNORMAL HIGH (ref 70–99)
Glucose-Capillary: 103 mg/dL — ABNORMAL HIGH (ref 70–99)
Glucose-Capillary: 110 mg/dL — ABNORMAL HIGH (ref 70–99)

## 2020-02-19 LAB — CBC
HCT: 29.4 % — ABNORMAL LOW (ref 36.0–46.0)
Hemoglobin: 9.8 g/dL — ABNORMAL LOW (ref 12.0–15.0)
MCH: 28.2 pg (ref 26.0–34.0)
MCHC: 33.3 g/dL (ref 30.0–36.0)
MCV: 84.7 fL (ref 80.0–100.0)
Platelets: 292 10*3/uL (ref 150–400)
RBC: 3.47 MIL/uL — ABNORMAL LOW (ref 3.87–5.11)
RDW: 15.3 % (ref 11.5–15.5)
WBC: 8.3 10*3/uL (ref 4.0–10.5)
nRBC: 0 % (ref 0.0–0.2)

## 2020-02-19 MED ORDER — HYDROCORTISONE 1 % EX OINT: TOPICAL_OINTMENT | CUTANEOUS | 0 refills | Status: AC

## 2020-02-19 MED ORDER — DIPHENHYDRAMINE-ZINC ACETATE 2-0.1 % EX CREA: TOPICAL_CREAM | Freq: Two times a day (BID) | CUTANEOUS | 0 refills | Status: DC | PRN

## 2020-02-19 MED ORDER — METRONIDAZOLE 500 MG PO TABS: 500.0000 mg | ORAL_TABLET | Freq: Three times a day (TID) | ORAL | 0 refills | Status: AC

## 2020-02-19 MED ORDER — DIPHENHYDRAMINE-ZINC ACETATE 2-0.1 % EX CREA
TOPICAL_CREAM | Freq: Two times a day (BID) | CUTANEOUS | Status: DC | PRN
  Filled 2020-02-19: qty 28

## 2020-02-19 MED ORDER — CIPROFLOXACIN HCL 500 MG PO TABS: 500.0000 mg | ORAL_TABLET | Freq: Two times a day (BID) | ORAL | 0 refills | Status: AC

## 2020-02-19 NOTE — Progress Notes (Signed)
[place] [facility] for Infectious Disease   Reason for visit: Follow up on bacteremia  Interval History: she feels well, asking about going home.  WBC 8.3, afebrile. No associated rash.   Physical Exam: Constitutional:  Vitals:   02/19/20 0524 02/19/20 1334  BP: (!) 166/87 (!) 146/85  Pulse: 87 92  Resp: 18 18  Temp: 98.7 F (37.1 C) 98.9 F (37.2 C)  SpO2: 100% 100%   patient appears in NAD Respiratory: Normal respiratory effort; CTA B Cardiovascular: RRR GI: soft  Review of Systems: Constitutional: negative for fevers and chills Gastrointestinal: negative for nausea  Lab Results  Component Value Date   WBC 8.3 02/19/2020   HGB 9.8 (L) 02/19/2020   HCT 29.4 (L) 02/19/2020   MCV 84.7 02/19/2020   PLT 292 02/19/2020    Lab Results  Component Value Date   CREATININE 0.64 02/19/2020   BUN 15 02/19/2020   NA 146 (H) 02/19/2020   K 4.2 02/19/2020   CL 119 (H) 02/19/2020   CO2 18 (L) 02/19/2020    Lab Results  Component Value Date   ALT 101 (H) 02/19/2020   AST 59 (H) 02/19/2020   ALKPHOS 641 (H) 02/19/2020     Microbiology: Recent Results (from the past 240 hour(s))  SARS Coronavirus 2 by RT PCR (hospital order, performed in [place] hospital lab) Nasopharyngeal Nasopharyngeal Swab     Status: None   Collection Time: 02/12/20  7:14 AM   Specimen: Nasopharyngeal Swab  Result Value Ref Range Status   SARS Coronavirus 2 NEGATIVE NEGATIVE Final    Comment: (NOTE) SARS-CoV-2 target nucleic acids are NOT DETECTED.  The SARS-CoV-2 RNA is generally detectable in upper and lower respiratory specimens during the acute phase of infection. The lowest concentration of SARS-CoV-2 viral copies this assay can detect is 250 copies / mL. A negative result does not preclude SARS-CoV-2 infection and should not be used as the sole basis for treatment or other patient management decisions.  A negative result may occur with improper specimen collection / handling,  submission of specimen other than nasopharyngeal swab, presence of viral mutation(s) within the areas targeted by this assay, and inadequate number of viral copies (<250 copies / mL). A negative result must be combined with clinical observations, patient history, and epidemiological information.  Fact Sheet for Patients:   StrictlyIdeas.no  Fact Sheet for Healthcare Providers: BankingDealers.co.za  This test is not yet approved or  cleared by the [place] FDA and has been authorized for detection and/or diagnosis of SARS-CoV-2 by FDA under an Emergency Use Authorization (EUA).  This EUA will remain in effect (meaning this test can be used) for the duration of the COVID-19 declaration under Section 564(b)(1) of the Act, 21 U.S.C. section 360bbb-3(b)(1), unless the authorization is terminated or revoked sooner.  Performed at [facility], [place] [address], [place], [place] [address]   Culture, blood (routine x 2)     Status: Abnormal (Preliminary result)   Collection Time: 02/15/20  7:42 AM   Specimen: BLOOD  Result Value Ref Range Status   Specimen Description   Final    BLOOD RIGHT ARM Performed at [facility] [address] [place], [place] [address]    Special Requests   Final    BOTTLES DRAWN AEROBIC AND ANAEROBIC Blood Culture adequate volume Performed at [facility] [address], [place], [place] [address]    Culture  Setup Time   Final  GRAM POSITIVE COCCI ANAEROBIC BOTTLE ONLY CRITICAL VALUE NOTED.  VALUE IS CONSISTENT WITH PREVIOUSLY REPORTED AND CALLED VALUE.    Culture (A)  Final    STAPHYLOCOCCUS HOMINIS SUSCEPTIBILITIES TO FOLLOW Performed at [facility], [place] [address] [place], [place] [address]    Report Status PENDING  Incomplete  Culture, blood (routine x 2)     Status: Abnormal   Collection Time: 02/15/20  7:51 AM   Specimen: BLOOD   Result Value Ref Range Status   Specimen Description   Final    BLOOD RIGHT ARM LOWER Performed at [facility] [address] [place], [place] [address]    Special Requests   Final    BOTTLES DRAWN AEROBIC AND ANAEROBIC Blood Culture adequate volume Performed at [facility] [address], [place], [place] [address]    Culture  Setup Time   Final    GRAM POSITIVE COCCI IN CLUSTERS IN BOTH AEROBIC AND ANAEROBIC BOTTLES CRITICAL RESULT CALLED TO, READ BACK BY AND VERIFIED WITH: PHARMD J. GADHIA 02/16/20 0918 FCP Performed at [facility], [place] [address] [place], [place] [address]    Culture STAPHYLOCOCCUS HOMINIS (A)  Final   Report Status 02/18/2020 FINAL  Final   Organism ID, Bacteria STAPHYLOCOCCUS HOMINIS  Final      Susceptibility   Staphylococcus hominis - MIC*    CIPROFLOXACIN <=0.5 SENSITIVE Sensitive     ERYTHROMYCIN <=0.25 SENSITIVE Sensitive     GENTAMICIN <=0.5 SENSITIVE Sensitive     OXACILLIN <=0.25 SENSITIVE Sensitive     TETRACYCLINE 2 SENSITIVE Sensitive     VANCOMYCIN 1 SENSITIVE Sensitive     TRIMETH/SULFA <=10 SENSITIVE Sensitive     CLINDAMYCIN <=0.25 SENSITIVE Sensitive     RIFAMPIN <=0.5 SENSITIVE Sensitive     Inducible Clindamycin NEGATIVE Sensitive     * STAPHYLOCOCCUS HOMINIS  Blood Culture ID Panel (Reflexed)     Status: Abnormal   Collection Time: 02/15/20  7:51 AM  Result Value Ref Range Status   Enterococcus faecalis NOT DETECTED NOT DETECTED Final   Enterococcus Faecium NOT DETECTED NOT DETECTED Final   Listeria monocytogenes NOT DETECTED NOT DETECTED Final   Staphylococcus species DETECTED (A) NOT DETECTED Final    Comment: CRITICAL RESULT CALLED TO, READ BACK BY AND VERIFIED WITH: PHARMD J. GADHIA 02/16/20 0918 FCP    Staphylococcus aureus (BCID) NOT DETECTED NOT DETECTED Final   Staphylococcus epidermidis NOT DETECTED NOT DETECTED Final   Staphylococcus lugdunensis NOT DETECTED NOT DETECTED  Final   Streptococcus species NOT DETECTED NOT DETECTED Final   Streptococcus agalactiae NOT DETECTED NOT DETECTED Final   Streptococcus pneumoniae NOT DETECTED NOT DETECTED Final   Streptococcus pyogenes NOT DETECTED NOT DETECTED Final   A.calcoaceticus-baumannii NOT DETECTED NOT DETECTED Final   Bacteroides fragilis NOT DETECTED NOT DETECTED Final   Enterobacterales NOT DETECTED NOT DETECTED Final   Enterobacter cloacae complex NOT DETECTED NOT DETECTED Final   Escherichia coli NOT DETECTED NOT DETECTED Final   Klebsiella aerogenes NOT DETECTED NOT DETECTED Final   Klebsiella oxytoca NOT DETECTED NOT DETECTED Final   Klebsiella pneumoniae NOT DETECTED NOT DETECTED Final   Proteus species NOT DETECTED NOT DETECTED Final   Salmonella species NOT DETECTED NOT DETECTED Final   Serratia marcescens NOT DETECTED NOT DETECTED Final   Haemophilus influenzae NOT DETECTED NOT DETECTED Final   Neisseria meningitidis NOT DETECTED NOT DETECTED Final   Pseudomonas aeruginosa NOT DETECTED NOT DETECTED Final   Stenotrophomonas maltophilia  NOT DETECTED NOT DETECTED Final   Candida albicans NOT DETECTED NOT DETECTED Final   Candida auris NOT DETECTED NOT DETECTED Final   Candida glabrata NOT DETECTED NOT DETECTED Final   Candida krusei NOT DETECTED NOT DETECTED Final   Candida parapsilosis NOT DETECTED NOT DETECTED Final   Candida tropicalis NOT DETECTED NOT DETECTED Final   Cryptococcus neoformans/gattii NOT DETECTED NOT DETECTED Final    Comment: Performed at [facility], [place] [address] [place], [place] [address]  Culture, Urine     Status: Abnormal   Collection Time: 02/15/20  1:51 PM   Specimen: Urine, Clean Catch  Result Value Ref Range Status   Specimen Description   Final    URINE, CLEAN CATCH Performed at [facility], [place] [address], [place], [place] [address]    Special Requests   Final    NONE Performed at [facility], [place] [address], [place], [place] [address]    Culture (A)  Final    20,000 COLONIES/mL MULTIPLE SPECIES PRESENT, SUGGEST RECOLLECTION   Report Status 02/16/2020 FINAL  Final  MRSA PCR Screening     Status: Abnormal   Collection Time: 02/15/20  7:06 PM   Specimen: Nasal Mucosa; Nasopharyngeal  Result Value Ref Range Status   MRSA by PCR POSITIVE (A) NEGATIVE Final    Comment:        The GeneXpert MRSA Assay (FDA approved for NASAL specimens only), is one component of a comprehensive MRSA colonization surveillance program. It is not intended to diagnose MRSA infection nor to guide or monitor treatment for MRSA infections. RESULT CALLED TO, READ BACK BY AND VERIFIED WITH: RN B BROOKS AT 2317 02/15/20 CRUICKSHANK A Performed at [facility], [place] [address], [place], [place] [address]   Culture, Urine     Status: Abnormal   Collection Time: 02/17/20  2:33 PM   Specimen: Urine, Random  Result Value Ref Range Status   Specimen Description   Final    URINE, RANDOM Performed at [facility] [address], [place], [place] [address]    Special Requests   Final    NONE Performed at [facility], [place] [address], [place], [place] [address]    Culture (A)  Final    <10,000 COLONIES/mL INSIGNIFICANT GROWTH Performed at [facility] [address], [place], [place] [address]    Report Status 02/18/2020 FINAL  Final  Culture, blood (routine x 2)     Status: None (Preliminary result)   Collection Time: 02/18/20  7:31 AM   Specimen: BLOOD  Result Value Ref Range Status   Specimen Description   Final    BLOOD BLOOD RIGHT HAND Performed at [facility] [address], [place], [place] [address]    Special Requests   Final    BOTTLES DRAWN AEROBIC AND ANAEROBIC Blood Culture adequate volume Performed at [facility] [address] [place], [place] [address]    Culture   Final    NO GROWTH < 24  HOURS Performed at [facility] [address] [place], [place] [address]    Report Status PENDING  Incomplete  Culture, blood (routine x 2)     Status: None (Preliminary result)   Collection Time: 02/18/20  7:42 AM   Specimen: BLOOD  Result Value Ref Range Status   Specimen Description   Final    BLOOD RIGHT ARM Performed at [facility]  [place] [address] [place], [place] [address]    Special Requests   Final    BOTTLES DRAWN AEROBIC ONLY Blood Culture results may not be optimal due to an inadequate volume of blood received in culture bottles Performed at [facility] [address] [place], [place] [address]    Culture   Final    NO GROWTH < 24 HOURS Performed at [facility] [address] [place], [place] [address]    Report Status PENDING  Incomplete    Impression/Plan:  1. Bacteremia - she continues to do better and no complaints.  Her WBC is normal and no fever.  Source control after removal of the stones.  Ok to stop the meropenem tomorrow, stop date placed already.   2.  Transaminitis - overall improvement with decreasing T bili.  Alk phos a bit up today but AST and ALT not significantly changed.  Continue to monitor.   I will sign off, call with any questions or concerns.

## 2020-02-19 NOTE — Progress Notes (Signed)
PT Cancellation Note  Patient Details Name: Shannon Garrett MRN: [id number] DOB: 04-Sep-1960   Cancelled Treatment:    Reason Eval/Treat Not Completed: PT screened, no needs identified, will sign off---same as per previous not on 02/14/20, pt status has not changed and she is not a rehab candidate.   Pt admitted from long-term care SNF placement and has been total care with use of mechanical lift equipment for significant length of time based on RN report and notes from multiple prior admissions.  Unfortunately, pt with no skilled PT needs at this time.  Recommend pt return to prior living arrangement at long-term SNF.  PT will sign off at this time.   [facility] 02/19/2020, 1:24 PM

## 2020-02-19 NOTE — Plan of Care (Signed)

## 2020-02-19 NOTE — Progress Notes (Signed)
PROGRESS NOTE  Mignonne Afonso DGU:[id number] DOB: Dec 29, 1960 DOA: 02/12/2020 PCP: Gildardo Cranker, DO  Brief History   Zuleika Gallus an 59 y.o.femalewith past medical history systolic congestive heart failure,depression, HTN ,COVID-19 infection in 05/2019, hypertension, multiple sclerosiswhowas admitted here under general surgery servicefor lapcholecystectomy. She was admitted here in January this year with cholecystitis. At the time cholecystectomy was not possible and she underwent percutaneous cholecystostomy tube placement. She underwent lap chole with drain placement on 02/12/2020 and was planned for skilled nursing facility placement but she developed fever, tachycardia and hypotension overnight with elevated white cell counts, elevated lipase, elevated LFTs. Sepsis secondary to possiblecholangitis was suspected. Started on broad-spectrum antibiotics, blood culture sent.  She underwent urgent ERCP by GI on 02/15/2020. Post procedure, she became hypotensive and had to be transferred to ICU and was started on Levophed. Currently hemodynamically stable.  The patient was transferred to the floor on 02/16/2020. The patient Blood cultures drawn on 02/15/2020 have growth out Staph hominis. The patient has been evaluated by infectious disease. She is receiving IV Vancomycin and meropenem. Dr. Linus Salmons recommended continuing IV antibiotics and following repeat cultures.  Consultants   General surgery  Infectious disease  Gastroenterology  Procedures   ERCP  Laparascopic cholecystectomy  Antibiotics   Anti-infectives (From admission, onward)   Start     Dose/Rate Route Frequency Ordered Stop   02/17/20 0500  vancomycin (VANCOREADY) IVPB 1250 mg/250 mL  Status:  Discontinued        1,250 mg 166.7 mL/hr over 90 Minutes Intravenous Every 12 hours 02/16/20 1421 02/18/20 1341   02/16/20 1430  vancomycin (VANCOREADY) IVPB 2000 mg/400 mL        2,000 mg 200 mL/hr over 120 Minutes  Intravenous  Once 02/16/20 1416 02/16/20 1519   02/15/20 1900  meropenem (MERREM) 1 g in sodium chloride 0.9 % 100 mL IVPB        1 g 200 mL/hr over 30 Minutes Intravenous Every 8 hours 02/15/20 1803 02/20/20 2359   02/15/20 1800  piperacillin-tazobactam (ZOSYN) IVPB 3.375 g  Status:  Discontinued        3.375 g 12.5 mL/hr over 240 Minutes Intravenous Every 6 hours 02/15/20 1701 02/15/20 1803   02/15/20 1700  meropenem (MERREM) 500 mg in sodium chloride 0.9 % 100 mL IVPB  Status:  Discontinued        500 mg 200 mL/hr over 30 Minutes Intravenous Every 8 hours 02/15/20 1656 02/15/20 1701   02/15/20 1000  piperacillin-tazobactam (ZOSYN) IVPB 3.375 g  Status:  Discontinued        3.375 g 12.5 mL/hr over 240 Minutes Intravenous Every 8 hours 02/15/20 0932 02/15/20 1656   02/12/20 1500  metroNIDAZOLE (FLAGYL) IVPB 500 mg        500 mg 100 mL/hr over 60 Minutes Intravenous Every 8 hours 02/12/20 1433 02/12/20 1701   02/12/20 1500  ciprofloxacin (CIPRO) IVPB 400 mg        400 mg 200 mL/hr over 60 Minutes Intravenous Every 12 hours 02/12/20 1433 02/12/20 1655   02/12/20 0600  vancomycin (VANCOREADY) IVPB 1500 mg/300 mL        1,500 mg 150 mL/hr over 120 Minutes Intravenous On call to O.R. 02/11/20 9563 02/12/20 1035        Subjective  The patient is resting comfortably. No new complaints.  Objective   Vitals:  Vitals:   02/19/20 0524 02/19/20 1334  BP: (!) 166/87 (!) 146/85  Pulse: 87 92  Resp: 18 18  Temp:  98.7 F (37.1 C) 98.9 F (37.2 C)  SpO2: 100% 100%   Exam:  Constitutional:   The patient is awake, alert, and oriented x 3. No acute distress. Respiratory:   No increased work of breathing.  No wheezes, rales, or rhonchi  No tactile fremitus Cardiovascular:   Regular rate and rhythm  No murmurs, ectopy, or gallups.  No lateral PMI. No thrills. Abdomen:   Abdomen is soft, non-tender, non-distended  No hernias, masses, or organomegaly  Normoactive bowel  sounds.  Musculoskeletal:   No cyanosis, clubbing, or edema Skin:   No rashes, lesions, ulcers  palpation of skin: no induration or nodules Neurologic:   CN 2-12 intact  Sensation all 4 extremities intact Psychiatric:   Mental status o Mood, affect appropriate o Orientation to person, place, time   judgment and insight appear intact  I have personally reviewed the following:   Today's Data   Vitals, CBC, CMP  Micro Data   Urine culture: Insignificant growth  Blood cultures x 2 (02/15/2020) 2/2 growth of staph hominis. Pan sensitive.  Blood cultures x 2 (02/18/2020) No growth  Imaging   CXR  Cardiology Data   None  Scheduled Meds:  amantadine  100 mg Oral Daily   baclofen  5 mg Oral QID   buPROPion  150 mg Oral Daily   Chlorhexidine Gluconate Cloth  6 each Topical Q0600   docusate sodium  100 mg Oral BID   DULoxetine  60 mg Oral Daily   enoxaparin (LOVENOX) injection  40 mg Subcutaneous Q24H   famotidine  20 mg Oral BID   gabapentin  800 mg Oral TID   hydrocortisone  1 application Topical BID   insulin aspart  0-15 Units Subcutaneous TID WC   lubiprostone  24 mcg Oral BID WC   mouth rinse  15 mL Mouth Rinse BID   melatonin  10 mg Oral QHS   metoprolol succinate  150 mg Oral Daily   mupirocin ointment  1 application Nasal BID   pantoprazole  40 mg Oral Daily   polyethylene glycol  17 g Oral Daily   sodium chloride flush  10-40 mL Intracatheter Q12H   spironolactone  25 mg Oral Daily   Continuous Infusions:  sodium chloride     meropenem (MERREM) IV 200 mL/hr at 02/19/20 0522    Principal Problem:   Cholangitis from CBD obstruction s/p ERCP 02/15/2020 Active Problems:   Obesity   MS (multiple sclerosis) (HCC)   Chronic diastolic CHF (congestive heart failure) (HCC)   Paraplegia (HCC)   Insulin dependent diabetes mellitus type IA (HCC)   Cholecystitis   Choledocholithiasis s/p ERCP 02/15/2020   LOS: 4 days   A & P    Septic shock/cholangitis/CBD stones:Status post lap cholecystectomywith drain placment.Met septic shock criteria withfever, hypotension, tachycardia, leukocytosis . Elevated lipase and liver enzymes.MRCP showed severely dilated common bile duct with extensive choledocholithiasis filling the common bile duct,associated mild intrahepatic biliary ductal dilatation, and mild pancreatic ductal dilatation, as well as evidence of acute pancreatitis. ERCP done on 02/17/2020 showed edematous ulcerated major papula, dilated biliary tree with multiple stones and sludge. Successful complete removal of the stones with biliary sphincterectomy and balloon sphincteroplasty. Patient was septic on last admission when she was admitted for cholecystitis,her bile cultures at that time showed ESBL E. coli and Enterococcus and she was treated with antibiotics. She was discharged on cholecystostomy drain. Blood pressure stable today but got report that she was hypotensive last night. Blood cultures showed staphylococcal  hominis. On vanco and merem. Infectious disease has recommendd continuing IV antibiotics and following cultures. Continuing monitoring CBC,CMP.  Acute hypoxic respiratory failure:Currently saturating 100% on room air.Atelectasis on CXR. This ismost likely from atelectasis from recent surgery.  History of chronic systolic congestive heart failure:Echo done on 1/21 showed EF of 40 to 45%, global hypokinesis,moderately elevated pulmonary artery pressure.She is on spironolactoneat home. Cardiac cath done on 4/21 showed nonischemic cardiomyopathy, angiographically normal coronary arteries.  Insulin-dependent diabetes: Monitor blood sugars. Currently on sliding scale insulin. Last hemoglobin A1C was 7.6.  Morbid obesity:BMI of 42.8.  Elevated troponin: Most likely this is from sepsis. No further work-up necessary.  Normocytic anemia:Currently hemoglobin stable. Continue to  monitor  Debility/deconditioning/multiple sclerosis:Patient has history of multiple sclerosis and paraplegic,bed bound. Planfor skilled nursing facility placement, she is long-term nursing homeresident.On amantadine, baclofen  Depression: On bupropion, duloxetine  Hypertension:  BP stable this mrng.  Home medications on hold  I have seen and examined this patient myself. I have spent 32 minutes in her evaluation and care.  DVT prophylaxis:lovenox Code Status: DNR Family Communication: None present at the bedside Disposition:  Status is: Inpatient  Remains inpatient appropriate because:IV treatments appropriate due to intensity of illness or inability to take PO   Dispo: The patient is from: Home              Anticipated d/c is to: Home              Anticipated d/c date is: 2 days              Patient currently is not medically stable to d/c.  Tiasha Helvie, DO [facility] Direct contact: see [email or web address]  7PM-7AM contact night coverage as above 02/19/2020, 2:12 PM  LOS: 3 days

## 2020-02-19 NOTE — Care Management Important Message (Signed)
Important Message  Patient Details IM Letter given to the Patient Name: Shannon Garrett MRN: [id number] Date of Birth: Mar 31, 1961   Medicare Important Message Given:  Yes     Kerin Salen 02/19/2020, 12:00 PM

## 2020-02-19 NOTE — Progress Notes (Signed)
OT Cancellation Note  Patient Details Name: Shannon Garrett MRN: [id number] DOB: January 18, 1961   Cancelled Treatment:    Reason Eval/Treat Not Completed: OT screened, no needs identified, will sign off. Please see prior OT note from 8/15. Patient is from facility and is total A except for feeding herself and is hoyer lift for transfers. Receiving therapy for R UE maintenance for feeding/trapeze bar to help assist for positioning per patient. No acute OT needs, will sign off at this time.  Delbert Phenix OT OT pager: [place] 02/19/2020, 4:09 PM

## 2020-02-19 NOTE — Discharge Summary (Signed)
Physician Discharge Summary  Patient ID: Shannon Garrett MRN: [id number] DOB/AGE: 02-18-1961 59 y.o.  Admit date: 02/12/2020 Discharge date: 02/20/2020  Admission Diagnoses: cholecystitis  Discharge Diagnoses:  Principal Problem:   Cholangitis from CBD obstruction s/p ERCP 02/15/2020 Active Problems:   Obesity   MS (multiple sclerosis) (HCC)   Chronic diastolic CHF (congestive heart failure) (HCC)   Paraplegia (HCC)   Insulin dependent diabetes mellitus type IA ([place])   Cholecystitis   Choledocholithiasis s/p ERCP 02/15/2020   Discharged Condition: good  Hospital Course: 59yo woman s/p interval lap chole, developed septic shock 2 days later and found to have choledocholithiasis/cholangitis, emergent ERCP was performed and she has been slowly improving since. JP removed. Her liver enzymes/ alk phos have still been a little bit up but Tbili nearly normal and she is looking well. She was felt to be ready for discharge to SNF on POD 8   Consults: None  Significant Diagnostic Studies: labs: cbc, cmet  Treatments: antibiotics: Cipro, analgesia: acetaminophen and surgery: see above  Discharge Exam: Blood pressure 133/64, pulse 86, temperature 99.7 F (37.6 C), temperature source Oral, resp. rate 16, height _0  (1.626 m), weight 113.3 kg, SpO2 99 %. General appearance: alert and cooperative GI: normal findings: soft, non-tender Incision/Wound: clean, dry intact  Disposition: Discharge disposition: 03-Skilled Nursing Facility        Allergies as of 02/20/2020      Reactions   Sulfonamide Derivatives Hives, Shortness Of Breath   Penicillins Hives, Itching   Prior course of rocephin charted 05/2015 Also note has tolerated Zosyn in past 2016 and 2017      Medication List    TAKE these medications   acetaminophen 325 MG tablet Commonly known as: TYLENOL Take 650 mg by mouth every 8 (eight) hours as needed for mild pain, moderate pain or headache.   albuterol 108 (90 Base)  MCG/ACT inhaler Commonly known as: VENTOLIN HFA Inhale 2 puffs into the lungs every 6 (six) hours as needed for wheezing or shortness of breath.   amantadine 100 MG capsule Commonly known as: SYMMETREL Take 100 mg by mouth daily.   ascorbic acid 500 MG tablet Commonly known as: VITAMIN C Take 500 mg by mouth daily.   aspirin 325 MG EC tablet Take 325 mg by mouth daily.   Baclofen 5 MG Tabs Take 5 mg by mouth 4 (four) times daily. Hold if lethargic or confused   buPROPion 150 MG 12 hr tablet Commonly known as: WELLBUTRIN SR Take 150 mg by mouth daily.   ciprofloxacin 500 MG tablet Commonly known as: Cipro Take 1 tablet (500 mg total) by mouth 2 (two) times daily for 5 days.   Cranberry 400 MG Tabs Take 400 mg by mouth daily.   diphenhydrAMINE 25 mg capsule Commonly known as: BENADRYL Take 25 mg by mouth every 8 (eight) hours as needed for itching.   diphenhydrAMINE-zinc acetate cream Commonly known as: BENADRYL Apply topically 2 (two) times daily as needed for itching (to dermatitis around two upper incisions).   DULoxetine HCl 60 MG Csdr Take 60 mg by mouth daily.   Ensure Take 237 mLs by mouth 2 (two) times daily between meals.   famotidine 20 MG tablet Commonly known as: PEPCID Take 1 tablet (20 mg total) by mouth 2 (two) times daily.   ferrous sulfate 325 (65 FE) MG tablet Take 325 mg by mouth 2 (two) times daily with a meal.   fluconazole 200 MG tablet Commonly known as: DIFLUCAN Take 1  tablet (200 mg total) by mouth daily.   fluticasone 50 MCG/ACT nasal spray Commonly known as: FLONASE Place 2 sprays into both nostrils every evening.   gabapentin 800 MG tablet Commonly known as: NEURONTIN Take 800 mg by mouth 3 (three) times daily.   hydrocortisone 1 % ointment Apply to dermatitis areas around upper two incisions   ibuprofen 400 MG tablet Commonly known as: ADVIL Take 400 mg by mouth every 12 (twelve) hours as needed for moderate pain.    insulin glargine 100 UNIT/ML injection Commonly known as: LANTUS Inject 0.1 mLs (10 Units total) into the skin daily. What changed: when to take this   latanoprost 0.005 % ophthalmic solution Commonly known as: XALATAN Place 1 drop into both eyes at bedtime.   lubiprostone 24 MCG capsule Commonly known as: AMITIZA Take 24 mcg by mouth 2 (two) times daily with a meal.   melatonin 5 MG Tabs Take 10 mg by mouth at bedtime.   metoprolol succinate 100 MG 24 hr tablet Commonly known as: TOPROL-XL Take 150 mg by mouth daily. Take with or immediately following a meal.   metroNIDAZOLE 500 MG tablet Commonly known as: Flagyl Take 1 tablet (500 mg total) by mouth 3 (three) times daily for 5 days.   MULTIVITAMIN ADULT PO Take 1 tablet by mouth daily.   NovoLOG FlexPen 100 UNIT/ML FlexPen Generic drug: insulin aspart 0-15 Units, Subcutaneous, 3 times daily with meals CBG < 70: implement hypoglycemia protocol-call MD CBG 70 - 120: 0 units CBG 121 - 150: 2 units CBG 151 - 200: 3 units CBG 201 - 250: 5 units CBG 251 - 300: 8 units CBG 301 - 350: 11 units CBG 351 - 400: 15 units CBG > 400: What changed:   how much to take  how to take this  when to take this   omeprazole 20 MG capsule Commonly known as: PRILOSEC Take 20 mg by mouth daily.   ondansetron 4 MG tablet Commonly known as: ZOFRAN Take 4 mg by mouth every 6 (six) hours as needed for nausea or vomiting.   potassium chloride 20 MEQ packet Commonly known as: KLOR-CON Take 20 mEq by mouth daily.   Probiotic Acidophilus Caps Take 1 capsule by mouth daily.   Senokot S 8.6-50 MG tablet Generic drug: senna-docusate Take 2 tablets by mouth 2 (two) times daily.   simethicone 125 MG chewable tablet Commonly known as: MYLICON Chew 032 mg by mouth every 6 (six) hours as needed for flatulence.   sodium phosphate Pediatric 3.5-9.5 GM/59ML enema Place 1 enema rectally once as needed for mild constipation, moderate  constipation or severe constipation.   spironolactone 25 MG tablet Commonly known as: ALDACTONE Take 25 mg by mouth daily. For CHF   tamsulosin 0.4 MG Caps capsule Commonly known as: FLOMAX Take 0.4 mg by mouth daily. For incontinence   traMADol 50 MG tablet Commonly known as: ULTRAM Take 100 mg by mouth every 6 (six) hours as needed (Pain).       Follow-up Information    Clovis Riley, MD In 3 weeks.   Specialty: General Surgery Contact information: [address] [place] [address] [phone]               Signed: Rosario Adie 2/50/0370, 9:00 AM

## 2020-02-19 NOTE — Discharge Instructions (Signed)
   LAPAROSCOPIC SURGERY: POST OP INSTRUCTIONS  1. DIET:  Avoid fast food or heavy meals as your are more likely to get nauseated or have irregular bowels.  A low-sugar, high-fiber diet for the rest of your life is ideal.  2. Take your usually prescribed home medications unless otherwise directed.  3. PAIN CONTROL: i. Continue tylenol, tramadol as needed that you were taking before  4. Avoid getting constipated.   a. Between the surgery and the pain medications, it is common to experience some constipation.   b. Increasing fluid intake and taking a fiber supplement (such as Metamucil, Citrucel, FiberCon, MiraLax, etc) 1-2 times a day regularly will usually help prevent this problem from occurring.   c. A mild laxative (prune juice, Milk of Magnesia, MiraLax, etc) should be taken according to package directions if there are no bowel movements after 48 hours.   5. Watch out for diarrhea.   a. If you have many loose bowel movements, simplify your diet to bland foods & liquids for a few days.   b. Stop any stool softeners and decrease your fiber supplement.   c. Switching to mild anti-diarrheal medications (Kayopectate, Pepto Bismol) can help.   d. If this worsens or does not improve, please call us.  6. Wash / shower every day.  You may shower over the skin glue which is waterproof  7. Glue will flake off after about 2 weeks.  You may leave the incision open to air.  Change drain site dressing daily until it is no longer draining- once this closes you can leave the site open to air   8. ACTIVITIES as tolerated  9. FOLLOW UP in our office a. Please call [facility] at [phone] to set up an appointment to see your surgeon in the office for a follow-up appointment approximately 2-3 weeks after your surgery. b. Make sure that you call for this appointment the day you arrive home to insure a convenient appointment time.  10. IF YOU HAVE DISABILITY OR FAMILY LEAVE FORMS, BRING THEM TO THE OFFICE  FOR PROCESSING.  DO NOT GIVE THEM TO YOUR DOCTOR.   WHEN TO CALL us [phone]: 1. Poor pain control 2. Reactions / problems with new medications (rash/itching, nausea, etc)  3. Fever over 101.5 F (38.5 C) 4. Inability to urinate 5. Nausea and/or vomiting 6. Worsening swelling or bruising 7. Continued bleeding from incision. 8. Increased pain, redness, or drainage from the incision   The clinic staff is available to answer your questions during regular business hours (8:30am-5pm).  Please don't hesitate to call and ask to speak to one of our nurses for clinical concerns.   If you have a medical emergency, go to the nearest emergency room or call 911.  A surgeon from [facility] is always on call at the [facility], [place], [place], [place], [place]  [address] ? MAIN: [phone] ? TOLL FREE: [phone] ?  FAX [phone] [email or web address]

## 2020-02-19 NOTE — Progress Notes (Addendum)
4 Days Post-Op  Subjective: CC: Doing well. Pain in epigastrium and RUQ has improved and is mild this am. Tolerating her soft diet (had mac and cheese last night for dinner) without n/v or increased abdominal pain. BM yesterday. Off o2. Has some pruritis to her upper abdominal incisions.   Objective: Vital signs in last 24 hours: Temp:  [97.8 F (36.6 C)-99.1 F (37.3 C)] 98.7 F (37.1 C) (08/20 0524) Pulse Rate:  [87-101] 87 (08/20 0524) Resp:  [16-21] 18 (08/20 0524) BP: (133-166)/(62-89) 166/87 (08/20 0524) SpO2:  [99 %-100 %] 100 % (08/20 0524) Arterial Line BP: (128)/(78) 128/78 (08/19 1000) Last BM Date: 02/18/20  Intake/Output from previous day: 08/19 0701 - 08/20 0700 In: 2246.3 [P.O.:720; I.V.:766.1; IV Piggyback:760.1] Out: 930 [Urine:900; Drains:30] Intake/Output this shift: Total I/O In: 240 [P.O.:240] Out: 0   PE: Gen: Alert, NAD, pleasant Card: RRR Pulm: CTA to upper lung fields. Distant at bases. Normal rate and effort. On RA.  Abd: Soft,RUQandepigastric tenderness that is mild and improved from yesterday. No peritonitis.+BS.Incisions with glue intact appears well and are without drainage, bleeding.There is some blanchable erythema surrounding without heat, induration, fluctuance, or drainage that is stable from yesterday. New small hives notes at the base of the areas of redness. Monitor incisions.JP drainw/SS output.  GU: Urine in cannister is pale straw yellow Ext: No LE edema or calf tenderness. Chronic LE contractures. SCDs in place Skin: no rashes noted, warm and dry  Lab Results:  Recent Labs    02/18/20 0600 02/19/20 0458  WBC 7.3 8.3  HGB 8.3* 9.8*  HCT 25.2* 29.4*  PLT 249 292   BMET Recent Labs    02/18/20 0600 02/19/20 0458  NA 145 146*  K 3.6 4.2  CL 120* 119*  CO2 20* 18*  GLUCOSE 132* 87  BUN 15 15  CREATININE 0.77 0.64  CALCIUM 7.9* 8.8*   PT/INR No results for input(s): LABPROT, INR in the last 72  hours. CMP     Component Value Date/Time   NA 146 (H) 02/19/2020 0458   NA 144 10/26/2019 1100   K 4.2 02/19/2020 0458   CL 119 (H) 02/19/2020 0458   CO2 18 (L) 02/19/2020 0458   GLUCOSE 87 02/19/2020 0458   BUN 15 02/19/2020 0458   BUN 21 10/26/2019 1100   CREATININE 0.64 02/19/2020 0458   CALCIUM 8.8 (L) 02/19/2020 0458   PROT 5.4 (L) 02/19/2020 0458   PROT 6.4 08/26/2018 0959   ALBUMIN 2.6 (L) 02/19/2020 0458   ALBUMIN 3.9 08/26/2018 0959   AST 59 (H) 02/19/2020 0458   ALT 101 (H) 02/19/2020 0458   ALKPHOS 641 (H) 02/19/2020 0458   BILITOT 1.6 (H) 02/19/2020 0458   BILITOT 0.2 08/26/2018 0959   GFRNONAA >60 02/19/2020 0458   GFRAA >60 02/19/2020 0458   Lipase     Component Value Date/Time   LIPASE 41 02/19/2020 0458       Studies/Results: No results found.  Anti-infectives: Anti-infectives (From admission, onward)   Start     Dose/Rate Route Frequency Ordered Stop   02/17/20 0500  vancomycin (VANCOREADY) IVPB 1250 mg/250 mL  Status:  Discontinued        1,250 mg 166.7 mL/hr over 90 Minutes Intravenous Every 12 hours 02/16/20 1421 02/18/20 1341   02/16/20 1430  vancomycin (VANCOREADY) IVPB 2000 mg/400 mL        2,000 mg 200 mL/hr over 120 Minutes Intravenous  Once 02/16/20 1416 02/16/20 1519   02/15/20  1900  meropenem (MERREM) 1 g in sodium chloride 0.9 % 100 mL IVPB        1 g 200 mL/hr over 30 Minutes Intravenous Every 8 hours 02/15/20 1803 02/20/20 2359   02/15/20 1800  piperacillin-tazobactam (ZOSYN) IVPB 3.375 g  Status:  Discontinued        3.375 g 12.5 mL/hr over 240 Minutes Intravenous Every 6 hours 02/15/20 1701 02/15/20 1803   02/15/20 1700  meropenem (MERREM) 500 mg in sodium chloride 0.9 % 100 mL IVPB  Status:  Discontinued        500 mg 200 mL/hr over 30 Minutes Intravenous Every 8 hours 02/15/20 1656 02/15/20 1701   02/15/20 1000  piperacillin-tazobactam (ZOSYN) IVPB 3.375 g  Status:  Discontinued        3.375 g 12.5 mL/hr over 240 Minutes  Intravenous Every 8 hours 02/15/20 0932 02/15/20 1656   02/12/20 1500  metroNIDAZOLE (FLAGYL) IVPB 500 mg        500 mg 100 mL/hr over 60 Minutes Intravenous Every 8 hours 02/12/20 1433 02/12/20 1701   02/12/20 1500  ciprofloxacin (CIPRO) IVPB 400 mg        400 mg 200 mL/hr over 60 Minutes Intravenous Every 12 hours 02/12/20 1433 02/12/20 1655   02/12/20 0600  vancomycin (VANCOREADY) IVPB 1500 mg/300 mL        1,500 mg 150 mL/hr over 120 Minutes Intravenous On call to O.R. 02/11/20 1638 02/12/20 1035       Assessment/Plan MS- bedbound HTN Hx CHF AKI - resolved ABL Anemia -stable at 9.8 Hyperglycemia - SSI. A1c 5.2 R pleural effusion - Weaned off o2 Pulm toilet LBBB - noted on workup. Hx of LBBB on EKG 1/17. Tn 27. No CP Urinary symptoms - ucx negative.  Cortisol levels normal  Hx of Acute Cholecystitis tx w/ Perc Chole tube 07/2019 S/p Laparoscopic Cholecystectomy w/ drain placement by Dr. Kae Heller 02/12/2020- POD #7 Sepsis w/ Choledocholithiasis and Cholangitis s/p ERCP 8/16 by Dr. Rush Landmark Gallstone Pancreatitis Staph Hominis Bacteremia- repeat Cx's pending  - Appreciate the coordinated help of GI, CCM and TRH with her care - T bili stable. LFT's slightly up. Lipase normalized. AM labs.  - Cont abx. Per ID. Appreciate their assistance.  - Maintain drain. Suspect can be removed prior to d/c - Monitor incisions. Appears to have dermatitis. Continue hydrocortisone  - PT/OT. From long term SNF per ntoes  - Pulm toilet - Suspect that she will be ready for discharge in the next 1-2 days.   FEN -Soft VTE -SCDs, Lovenox ID -Cirpo/Flagyl peri-op. Zosyn 8/16(allergy). Vanc 8/17 - 8/19. Merrem 8/16 >> + Staph hominis blood cx's. WBC 8.3   LOS: 4 days    Jillyn Ledger , [facility] 02/19/2020, 9:44 AM Please see Amion for pager number during day hours 7:00am-4:30pm

## 2020-02-20 LAB — BASIC METABOLIC PANEL
Anion gap: 12 (ref 5–15)
BUN: 12 mg/dL (ref 6–20)
CO2: 21 mmol/L — ABNORMAL LOW (ref 22–32)
Calcium: 8.7 mg/dL — ABNORMAL LOW (ref 8.9–10.3)
Chloride: 111 mmol/L (ref 98–111)
Creatinine, Ser: 0.7 mg/dL (ref 0.44–1.00)
GFR calc Af Amer: >60 mL/min (ref >60)
GFR calc non Af Amer: >60 mL/min (ref >60)
Glucose, Bld: 101 mg/dL — ABNORMAL HIGH (ref 70–99)
Potassium: 4.6 mmol/L (ref 3.5–5.1)
Sodium: 144 mmol/L (ref 135–145)

## 2020-02-20 LAB — COMPREHENSIVE METABOLIC PANEL
ALT: 85 U/L — ABNORMAL HIGH (ref 0–44)
AST: 54 U/L — ABNORMAL HIGH (ref 15–41)
Albumin: 2.3 g/dL — ABNORMAL LOW (ref 3.5–5.0)
Alkaline Phosphatase: 681 U/L — ABNORMAL HIGH (ref 38–126)
Anion gap: 8 (ref 5–15)
BUN: 14 mg/dL (ref 6–20)
CO2: 19 mmol/L — ABNORMAL LOW (ref 22–32)
Calcium: 8.5 mg/dL — ABNORMAL LOW (ref 8.9–10.3)
Chloride: 113 mmol/L — ABNORMAL HIGH (ref 98–111)
Creatinine, Ser: 0.71 mg/dL (ref 0.44–1.00)
GFR calc Af Amer: >60 mL/min (ref >60)
GFR calc non Af Amer: >60 mL/min (ref >60)
Glucose, Bld: 91 mg/dL (ref 70–99)
Potassium: 5.7 mmol/L — ABNORMAL HIGH (ref 3.5–5.1)
Sodium: 140 mmol/L (ref 135–145)
Total Bilirubin: 1.1 mg/dL (ref 0.3–1.2)
Total Protein: 5.2 g/dL — ABNORMAL LOW (ref 6.5–8.1)

## 2020-02-20 LAB — GLUCOSE, CAPILLARY
Glucose-Capillary: 92 mg/dL (ref 70–99)
Glucose-Capillary: 97 mg/dL (ref 70–99)
Glucose-Capillary: 113 mg/dL — ABNORMAL HIGH (ref 70–99)

## 2020-02-20 LAB — POTASSIUM: Potassium: 4.6 mmol/L (ref 3.5–5.1)

## 2020-02-20 MED ORDER — SODIUM ZIRCONIUM CYCLOSILICATE 10 G PO PACK
10.0000 g | PACK | Freq: Once | ORAL | Status: AC
  Administered 2020-02-20: 10 g via ORAL
  Filled 2020-02-20: qty 1

## 2020-02-20 MED ORDER — FLUCONAZOLE 200 MG PO TABS
200.0000 mg | ORAL_TABLET | Freq: Every day | ORAL | 0 refills | Status: DC
Start: 2020-02-20 — End: 2020-05-21

## 2020-02-20 MED ORDER — FLUCONAZOLE 100 MG PO TABS
200.0000 mg | ORAL_TABLET | Freq: Every day | ORAL | Status: DC
  Administered 2020-02-20: 200 mg via ORAL
  Filled 2020-02-20: qty 2

## 2020-02-20 NOTE — Progress Notes (Signed)
CSW spoke to [name] in administration at  [place] at ph: [phone] stating the patient has been offered a bed for her return and has been accepted back to her LTC unit and that the pt can arrive on 02/20/20.  The pt's accepting doctor is SNF MD.  The room number will be 216.  The number for report is the main line at [phone] (and if unable to reach anyone please call the administrator/admin RN Carrie Mew at ph: [phone].  CSW will update RN and EDP.  Alphonse Guild. Shannon Garrett, Shannon Garrett, LCAS Clinical Social Worker Ph: [phone]

## 2020-02-20 NOTE — Progress Notes (Signed)
Assessment unchanged. Pt picked up by [facility]. Discharged to [facility]. Left via stretcher accompanied by paramedics X 2.

## 2020-02-20 NOTE — Progress Notes (Signed)
CSW sent D/C Summary and PT notes to the facility via the hub and by fax.  CSW will continue to follow for D/C needs.  Alphonse Guild. Aidyn Kellis  MSW, LCSW, LCAS, [facility] Transitions of Care Clinical Social Worker Care Coordination Department Ph: [phone]

## 2020-02-20 NOTE — Progress Notes (Signed)
CSW spoke to Dr. Kieth Brightly with [facility] who states that pt is not to be discharged until pt's potassium level is appropriate and states it was 5.7 and should preferable by at a "5".  CSW will update RN that the doctor should be updated to clear the pt for D/C at pager: [phone].  MD also states that D/C Summary from 8/20 does not need to be updated prior to D/C.  RN updated.  CSW will continue to follow for D/C needs.  Alphonse Guild. Renold Kozar  MSW, LCSW, LCAS, [facility] Transitions of Care Clinical Social Worker Care Coordination Department Ph: [phone]

## 2020-02-20 NOTE — Progress Notes (Addendum)
Report called to [facility] and given to [employer], Therapist, sports.

## 2020-02-20 NOTE — Progress Notes (Signed)
[facility] called at RN's request.  CSW will continue to follow for D/C needs.  Alphonse Guild. Skyra Crichlow  MSW, LCSW, LCAS, [facility] Transitions of Care Clinical Social Worker Care Coordination Department Ph: [phone]

## 2020-02-20 NOTE — Progress Notes (Addendum)
Assessment completed.  FL-2 (signed by MD), therapy notes and D/C summary sent via the hub.  CSW will continue to follow for D/C needs.  Alphonse Guild. Kenyan Karnes  MSW, LCSW, LCAS, [facility] Transitions of Care Clinical Social Worker Care Coordination Department Ph: [phone]

## 2020-02-20 NOTE — Progress Notes (Signed)
CSW received a call from [place] at [facility] who is the back-up for Mariann Laster in admissions who asks that pt's D/C summary be sent to the [facility] on duty at [phone].  CSW will continue to follow for D/C needs.  Alphonse Guild. Minnie Shi  MSW, LCSW, LCAS, [facility] Transitions of Care Clinical Social Worker Care Coordination Department Ph: [phone]

## 2020-02-20 NOTE — Progress Notes (Signed)
PROGRESS NOTE  Shannon Garrett YCX:[id number] DOB: Feb 05, 1961 DOA: 02/12/2020 PCP: Shannon Cranker, DO  Brief History   Shannon Garrett an 59 y.o.femalewith past medical history systolic congestive heart failure,depression, HTN ,COVID-19 infection in 05/2019, hypertension, multiple sclerosiswhowas admitted here under general surgery servicefor lapcholecystectomy. She was admitted here in January this year with cholecystitis. At the time cholecystectomy was not possible and she underwent percutaneous cholecystostomy tube placement. She underwent lap chole with drain placement on 02/12/2020 and was planned for skilled nursing facility placement but she developed fever, tachycardia and hypotension overnight with elevated white cell counts, elevated lipase, elevated LFTs. Sepsis secondary to possiblecholangitis was suspected. Started on broad-spectrum antibiotics, blood culture sent.  She underwent urgent ERCP by GI on 02/15/2020. Post procedure, she became hypotensive and had to be transferred to ICU and was started on Levophed. Currently hemodynamically stable.  The patient was transferred to the floor on 02/16/2020. The patient Blood cultures drawn on 02/15/2020 have growth out Staph hominis. The patient has been evaluated by infectious disease. She is receiving IV Vancomycin and meropenem. Dr. Linus Garrett recommended continuing IV antibiotics and following repeat cultures.  This morning the patient's potassium is increased at 5.7. She has had a dose of Lokelma and spironolactone has been discontinued. Will recheck potassium.  Consultants  . General surgery . Infectious disease . Gastroenterology  Procedures  . ERCP . Laparascopic cholecystectomy  Antibiotics   Anti-infectives (From admission, onward)   Start     Dose/Rate Route Frequency Ordered Stop   02/20/20 1000  fluconazole (DIFLUCAN) tablet 200 mg        200 mg Oral Daily 02/20/20 0853 02/23/20 0959   02/20/20 0000  fluconazole  (DIFLUCAN) 200 MG tablet        200 mg Oral Daily 02/20/20 0855     02/19/20 0000  ciprofloxacin (CIPRO) 500 MG tablet        500 mg Oral 2 times daily 02/19/20 1453 02/24/20 2359   02/19/20 0000  metroNIDAZOLE (FLAGYL) 500 MG tablet        500 mg Oral 3 times daily 02/19/20 1453 02/24/20 2359   02/17/20 0500  vancomycin (VANCOREADY) IVPB 1250 mg/250 mL  Status:  Discontinued        1,250 mg 166.7 mL/hr over 90 Minutes Intravenous Every 12 hours 02/16/20 1421 02/18/20 1341   02/16/20 1430  vancomycin (VANCOREADY) IVPB 2000 mg/400 mL        2,000 mg 200 mL/hr over 120 Minutes Intravenous  Once 02/16/20 1416 02/16/20 1519   02/15/20 1900  meropenem (MERREM) 1 g in sodium chloride 0.9 % 100 mL IVPB        1 g 200 mL/hr over 30 Minutes Intravenous Every 8 hours 02/15/20 1803 02/20/20 2359   02/15/20 1800  piperacillin-tazobactam (ZOSYN) IVPB 3.375 g  Status:  Discontinued        3.375 g 12.5 mL/hr over 240 Minutes Intravenous Every 6 hours 02/15/20 1701 02/15/20 1803   02/15/20 1700  meropenem (MERREM) 500 mg in sodium chloride 0.9 % 100 mL IVPB  Status:  Discontinued        500 mg 200 mL/hr over 30 Minutes Intravenous Every 8 hours 02/15/20 1656 02/15/20 1701   02/15/20 1000  piperacillin-tazobactam (ZOSYN) IVPB 3.375 g  Status:  Discontinued        3.375 g 12.5 mL/hr over 240 Minutes Intravenous Every 8 hours 02/15/20 0932 02/15/20 1656   02/12/20 1500  metroNIDAZOLE (FLAGYL) IVPB 500 mg  500 mg 100 mL/hr over 60 Minutes Intravenous Every 8 hours 02/12/20 1433 02/12/20 1701   02/12/20 1500  ciprofloxacin (CIPRO) IVPB 400 mg        400 mg 200 mL/hr over 60 Minutes Intravenous Every 12 hours 02/12/20 1433 02/12/20 1655   02/12/20 0600  vancomycin (VANCOREADY) IVPB 1500 mg/300 mL        1,500 mg 150 mL/hr over 120 Minutes Intravenous On call to O.R. 02/11/20 4315 02/12/20 1035     .   Subjective  The patient is resting comfortably. No new complaints.  Objective   Vitals:    Vitals:   02/20/20 0623 02/20/20 0944  BP: 133/64 (!) 147/79  Pulse: 86 90  Resp: 16 (!) 22  Temp: 99.7 F (37.6 C) 98.8 F (37.1 C)  SpO2: 99% 100%   Exam:  Constitutional:  . The patient is awake, alert, and oriented x 3. No acute distress. Respiratory:  . No increased work of breathing. . No wheezes, rales, or rhonchi . No tactile fremitus Cardiovascular:  . Regular rate and rhythm . No murmurs, ectopy, or gallups. . No lateral PMI. No thrills. Abdomen:  . Abdomen is soft, non-tender, non-distended . No hernias, masses, or organomegaly . Normoactive bowel sounds.  Musculoskeletal:  . No cyanosis, clubbing, or edema Skin:  . No rashes, lesions, ulcers . palpation of skin: no induration or nodules Neurologic:  . CN 2-12 intact . Sensation all 4 extremities intact Psychiatric:  . Mental status o Mood, affect appropriate o Orientation to person, place, time  . judgment and insight appear intact  I have personally reviewed the following:   Today's Data  . Vitals, CMP  Micro Data  . Urine culture: Insignificant growth . Blood cultures x 2 (02/15/2020) 2/2 growth of staph hominis. Pan sensitive. . Blood cultures x 2 (02/18/2020) No growth  Imaging  . CXR  Cardiology Data  . None  Scheduled Meds: . amantadine  100 mg Oral Daily  . baclofen  5 mg Oral QID  . buPROPion  150 mg Oral Daily  . Chlorhexidine Gluconate Cloth  6 each Topical Q0600  . docusate sodium  100 mg Oral BID  . DULoxetine  60 mg Oral Daily  . enoxaparin (LOVENOX) injection  40 mg Subcutaneous Q24H  . famotidine  20 mg Oral BID  . fluconazole  200 mg Oral Daily  . gabapentin  800 mg Oral TID  . hydrocortisone  1 application Topical BID  . insulin aspart  0-15 Units Subcutaneous TID WC  . lubiprostone  24 mcg Oral BID WC  . mouth rinse  15 mL Mouth Rinse BID  . melatonin  10 mg Oral QHS  . metoprolol succinate  150 mg Oral Daily  . pantoprazole  40 mg Oral Daily  . polyethylene  glycol  17 g Oral Daily  . sodium chloride flush  10-40 mL Intracatheter Q12H   Continuous Infusions: . sodium chloride    . meropenem [facility]) IV 1 g (02/19/20 2123)    Principal Problem:   Cholangitis from CBD obstruction s/p ERCP 02/15/2020 Active Problems:   Obesity   MS (multiple sclerosis) (HCC)   Chronic diastolic CHF (congestive heart failure) (HCC)   Paraplegia (HCC)   Insulin dependent diabetes mellitus type IA ([place])   Cholecystitis   Choledocholithiasis s/p ERCP 02/15/2020   LOS: 5 days   A & P  Septic shock/cholangitis/CBD stones:Status post lap cholecystectomywith drain placment.Met septic shock criteria withfever, hypotension, tachycardia, leukocytosis . Elevated  lipase and liver enzymes.MRCP showed severely dilated common bile duct with extensive choledocholithiasis filling the common bile duct,associated mild intrahepatic biliary ductal dilatation, and mild pancreatic ductal dilatation, as well as evidence of acute pancreatitis.ERCP done on 02/17/2020 showed edematous ulcerated major papula, dilated biliary tree with multiple stones and sludge. Successful complete removal of the stones with biliary sphincterectomy and balloon sphincteroplasty.Patient was septic on last admission when she was admitted for cholecystitis,her bile cultures at that time showed ESBL E. coli and Enterococcus and she was treated with antibiotics. She was discharged on cholecystostomy drain. Blood pressure stable today but got report that she was hypotensive last night. Blood cultures showed staphylococcal hominis. On vanco and merem. Infectious disease has recommendd continuing IV antibiotics and following cultures. Continuing monitoring CBC,CMP.  Hyperkalemia: The patient has been given a dose of lokelma. Will recheck chemistry. Spironolactone has been discontinued.  Acute hypoxic respiratory failure:Currently saturating 100% on room air.Atelectasis on CXR. This ismost likely from  atelectasis from recent surgery.  History of chronic systolic congestive heart failure:Echo done on 1/21 showed EF of 40 to 45%, global hypokinesis,moderately elevated pulmonary artery pressure.She is on spironolactoneat home. Cardiac cath done on 4/21 showed nonischemic cardiomyopathy, angiographically normal coronary arteries.  Insulin-dependent diabetes: Monitor blood sugars. Currently on sliding scale insulin. Last hemoglobin A1C was 7.6.  Morbid obesity:BMI of 42.8.  Elevated troponin: Most likely this is from sepsis. No further work-up necessary.  Normocytic anemia:Currently hemoglobin stable. Continue to monitor  Debility/deconditioning/multiple sclerosis:Patient has history of multiple sclerosis and paraplegic,bed bound. Planfor skilled nursing facility placement, she is long-term nursing homeresident.On amantadine, baclofen  Depression: On bupropion, duloxetine  Hypertension:  BP stable this mrng.  Home medications on hold  I have seen and examined this patient myself. I have spent 32 minutes in her evaluation and care.  DVT prophylaxis:lovenox Code Status: DNR Family Communication: None present at the bedside Disposition:  Status is: Inpatient  Remains inpatient appropriate because:IV treatments appropriate due to intensity of illness or inability to take PO   Dispo: The patient is from: Home              Anticipated d/c is to: Home              Anticipated d/c date is: 2 days              Patient currently is not medically stable to d/c.  Elya Diloreto, DO [facility] Direct contact: see [email or web address]  7PM-7AM contact night coverage as above 02/20/2020, 1:37 PM  LOS: 3 days

## 2020-02-20 NOTE — NC FL2 (Signed)
[place] LEVEL OF CARE SCREENING TOOL     IDENTIFICATION  Patient Name: Shannon Garrett Birthdate: 02-01-1961 Sex: female Admission Date (Current Location): 02/12/2020  [place] and [place] Number:  Kathleen Argue [id number] [place] and Address:  [facility],  [place], [place]      Provider Number: [id number]  Attending Physician Name and Address:  Clovis Riley, MD  Relative Name and Phone Number:       Current Level of Care: Hospital Recommended Level of Care: [place] Prior Approval Number:    Date Approved/Denied: 11/04/13 PASRR Number: [id number]  Discharge Plan: SNF    Current Diagnoses: Patient Active Problem List   Diagnosis Date Noted  . Cholangitis from CBD obstruction s/p ERCP 02/15/2020 02/16/2020  . Choledocholithiasis s/p ERCP 02/15/2020 02/16/2020  . Cholecystitis 02/12/2020  . Abnormal nuclear stress test   . Preoperative cardiovascular examination   . Acute calculous cholecystitis 07/19/2019  . Insulin dependent diabetes mellitus type IA ([place]) 07/19/2019  . History of Clostridioides difficile colitis 07/19/2019  . Multiple drug resistant organism (MDRO) Klebsiella pneumonia UTI culture positive 07/19/2019  . Enteritis due to Clostridium difficile 06/24/2019  . COVID-19 virus infection 06/24/2019  . Palliative care by specialist   . DNR (do not resuscitate)   . Sepsis ([place]) 05/10/2019  . History of COVID-19 Nov2020 05/10/2019  . Dysesthesia 08/26/2018  . Dyslipidemia 06/13/2017  . Elevated alkaline phosphatase level 06/13/2017  . Acute frontal sinusitis 05/24/2017  . Influenza 04/19/2017  . Glaucoma 04/06/2017  . Allergic rhinitis due to allergen 03/07/2017  . Vaginal candidiasis 03/01/2017  . UTI (urinary tract infection) 01/02/2017  . High risk medication use 01/25/2016  . Neuropathy 11/13/2015  . GERD (gastroesophageal reflux disease) 11/13/2015  . Dysphagia as late effect of stroke  11/08/2015  . Adrenal insufficiency (Addison's disease) ([place]) 08/12/2015  . Vitamin D deficiency 06/18/2015  . Hypokalemia 06/03/2015  . Chronic anemia 05/30/2015  . Chronic pain syndrome 03/03/2015  . Essential hypertension   . Dilated cardiomyopathy ([place])   . Chronic constipation 11/26/2014  . Depression 11/26/2014  . Adult failure to thrive 07/31/2013  . Insomnia 06/03/2013  . Neurogenic bladder 06/03/2013  . Paraplegia ([place]) 09/05/2012  . Chronic diastolic CHF (congestive heart failure) ([place]) 05/22/2012  . Obesity 11/30/2008  . MS (multiple sclerosis) ([place]) 11/30/2008  . Hypertensive heart disease with CHF (congestive heart failure) ([place]) 11/30/2008    Orientation RESPIRATION BLADDER Height & Weight     Self, Time, Situation, Place  Normal Incontinent Weight: 249 lb 12.5 oz (113.3 kg) Height:  5\' 4"  (162.6 cm)  BEHAVIORAL SYMPTOMS/MOOD NEUROLOGICAL BOWEL NUTRITION STATUS      Incontinent Diet (Pt is carb-modified)  AMBULATORY STATUS COMMUNICATION OF NEEDS Skin   Total Care Verbally  (Pt has a moisture-related skin issue on her sacram w/dressing)                       Personal Care Assistance Level of Assistance  Bathing, Dressing, Total care Bathing Assistance: Maximum assistance   Dressing Assistance: Maximum assistance Total Care Assistance: Maximum assistance   Functional Limitations Info    Sight Info: Adequate        SPECIAL CARE FACTORS FREQUENCY                       Contractures Contractures Info: Not present    Additional Factors Info  Code Status, Allergies Code Status Info: DNR Allergies Info:  Sulfonamide Derivatives, Penicillins           Current Medications (02/20/2020):  This is the current hospital active medication list Current Facility-Administered Medications  Medication Dose Route Frequency Provider Last Rate Last Admin  . 0.9 %  sodium chloride infusion  250 mL Intravenous Continuous Maczis, Barth Kirks, PA-C      .  acetaminophen (TYLENOL) tablet 1,000 mg  1,000 mg Oral Q6H PRN Romana Juniper A, MD      . albuterol (PROVENTIL) (2.5 MG/3ML) 0.083% nebulizer solution 2.5 mg  2.5 mg Nebulization Q6H PRN Maczis, Barth Kirks, PA-C      . amantadine (SYMMETREL) capsule 100 mg  100 mg Oral Daily Jillyn Ledger, PA-C   100 mg at 02/20/20 1202  . baclofen (LIORESAL) tablet 5 mg  5 mg Oral QID Jillyn Ledger, PA-C   5 mg at 02/20/20 1147  . bisacodyl (DULCOLAX) suppository 10 mg  10 mg Rectal Daily PRN Jillyn Ledger, PA-C      . buPROPion [facility] SR) 12 hr tablet 150 mg  150 mg Oral Daily Jillyn Ledger, PA-C   150 mg at 02/20/20 1148  . Chlorhexidine Gluconate Cloth 2 % PADS 6 each  6 each Topical Q0600 Jillyn Ledger, PA-C   6 each at 02/19/20 0800  . diphenhydrAMINE (BENADRYL) capsule 25 mg  25 mg Oral Q8H PRN Jillyn Ledger, PA-C   25 mg at 02/19/20 2351  . diphenhydrAMINE-zinc acetate (BENADRYL) 2-0.1 % cream   Topical BID PRN Romana Juniper A, MD      . docusate sodium (COLACE) capsule 100 mg  100 mg Oral BID Jillyn Ledger, PA-C   100 mg at 02/20/20 1149  . DULoxetine (CYMBALTA) DR capsule 60 mg  60 mg Oral Daily Jillyn Ledger, PA-C   60 mg at 02/20/20 1146  . enoxaparin (LOVENOX) injection 40 mg  40 mg Subcutaneous Q24H Jillyn Ledger, PA-C   40 mg at 02/20/20 1200  . famotidine (PEPCID) tablet 20 mg  20 mg Oral BID Jillyn Ledger, PA-C   20 mg at 02/20/20 1148  . fluconazole (DIFLUCAN) tablet 200 mg  200 mg Oral Daily Leighton Ruff, MD   811 mg at 02/20/20 1145  . gabapentin (NEURONTIN) capsule 800 mg  800 mg Oral TID Jillyn Ledger, PA-C   800 mg at 02/20/20 1146  . hydrALAZINE (APRESOLINE) injection 10 mg  10 mg Intravenous Q2H PRN Jillyn Ledger, PA-C   10 mg at 02/18/20 0943  . hydrocortisone 1 % ointment 1 application  1 application Topical BID Minda Ditto, RPH   1 application at 91/47/82 1150  . insulin aspart (novoLOG) injection 0-15 Units  0-15 Units  Subcutaneous TID WC Jillyn Ledger, PA-C   3 Units at 02/18/20 1807  . lubiprostone (AMITIZA) capsule 24 mcg  24 mcg Oral BID WC Jillyn Ledger, PA-C   24 mcg at 02/20/20 0941  . MEDLINE mouth rinse  15 mL Mouth Rinse BID Jillyn Ledger, PA-C   15 mL at 02/19/20 2122  . melatonin tablet 10 mg  10 mg Oral QHS Jillyn Ledger, PA-C   10 mg at 02/19/20 2119  . meropenem (MERREM) 1 g in sodium chloride 0.9 % 100 mL IVPB  1 g Intravenous Q8H Thayer Headings, MD 200 mL/hr at 02/19/20 2123 1 g at 02/19/20 2123  . metoprolol succinate (TOPROL-XL) 24 hr tablet 150 mg  150 mg Oral Daily Maczis,  Barth Kirks, PA-C   150 mg at 02/20/20 1146  . ondansetron (ZOFRAN) tablet 4 mg  4 mg Oral Q6H PRN Maczis, Barth Kirks, PA-C      . pantoprazole (PROTONIX) EC tablet 40 mg  40 mg Oral Daily Jillyn Ledger, PA-C   40 mg at 02/20/20 1148  . polyethylene glycol (MIRALAX / GLYCOLAX) packet 17 g  17 g Oral Daily Jillyn Ledger, PA-C   17 g at 02/19/20 1100  . simethicone (MYLICON) chewable tablet 80 mg  80 mg Oral Q6H PRN Maczis, Barth Kirks, PA-C      . sodium chloride flush (NS) 0.9 % injection 10-40 mL  10-40 mL Intracatheter Q12H Jillyn Ledger, PA-C   10 mL at 02/18/20 0917  . sodium chloride flush (NS) 0.9 % injection 10-40 mL  10-40 mL Intracatheter PRN Maczis, Barth Kirks, PA-C      . sodium phosphate (FLEET) 7-19 GM/118ML enema 1 enema  1 enema Rectal Once PRN Maczis, Barth Kirks, PA-C      . traMADol Veatrice Bourbon) tablet 50-100 mg  50-100 mg Oral Q6H PRN Clovis Riley, MD   100 mg at 02/19/20 6712     Discharge Medications: Please see discharge summary for a list of discharge medications.  Relevant Imaging Results:  Relevant Lab Results:   Additional Information [id number]  Alphonse Guild Axten Pascucci, LCSW

## 2020-02-20 NOTE — TOC Transition Note (Signed)
Transition of Care [facility]) - CM/SW Discharge Note   Patient Details  Name: Shannon Garrett MRN: [id number] Date of Birth: [birth year]  [facility] CM/SW Contact:  Claudine Mouton, LCSW Phone Number: 02/20/2020, 1:45 PM   Clinical Narrative:   CSW spoke to Dr. Kieth Brightly with [facility] who states that pt is not to be discharged until pt's potassium level is appropriate and states it was 5.7 and should preferable by at a "5".  CSW will update RN that the doctor should be updated to clear the pt for D/C at pager: [phone].  MD also states that D/C Summary from 8/20 does not need to be updated prior to D/C.  CSW spoke to [place] in administration at  [place] at ph: [phone] stating the patient has been offered a bed for her return and has been accepted back to her LTC unit and that the pt can arrive on 02/20/20.  The pt's accepting doctor is SNF MD.    Final next level of care: Skilled Nursing Facility Barriers to Discharge: No Barriers Identified   Patient Goals and CMS Choice Patient states their goals for this hospitalization and ongoing recovery are:: Pt to return to pt's LTC SNF      Discharge Placement   Existing PASRR number confirmed : 02/20/20            Patient to be transferred to facility by: [place] Name of family member notified: Pt's family contact info was outdated and CSW was unable to reach anyone. Patient and family notified of of transfer: 02/20/20  Discharge Plan and Services   Discharge Planning Services: CM Consult Post Acute Care Choice: Resumption of Svcs/PTA Provider          DME Arranged: N/A DME Agency: NA       HH Arranged: NA HH Agency: NA        Social Determinants of Health (SDOH) Interventions     Readmission Risk Interventions No flowsheet data found.

## 2020-02-20 NOTE — Progress Notes (Addendum)
Weekend Float CSW received a handoff from the 1st shift [facility] Weekend RN CM that pt is ready for D/C to [place] where pt resides for LTC.  CSW contacted Florentina Jenny in admissions with [place] at ph: [phone] and the main desk at ph:  ,and left a HIPPA-compliant message requesting a call back.  CSW also called the sister facility to request admissions there contact [facility].  CSW spoke to [place] who provided a room # and a number for report (see following note).  CSW will continue to follow for D/C needs.  Alphonse Guild. Mcgwire Dasaro  MSW, LCSW, LCAS, CCS Transitions of Care Clinical Social Worker Care Coordination Department Ph: [phone]     .

## 2020-02-22 ENCOUNTER — Encounter: Payer: Self-pay | Admitting: Infectious Disease

## 2020-02-22 ENCOUNTER — Telehealth: Payer: Self-pay | Admitting: Infectious Disease

## 2020-02-22 LAB — CULTURE, BLOOD (ROUTINE X 2)
Special Requests: ADEQUATE
Special Requests: ADEQUATE

## 2020-02-22 NOTE — Telephone Encounter (Signed)
Microbiology called to let me know that BOTH cultures were OX R and that the organisms were the same  I was calling to bring the pt back to the hospital when microbiology called me back to clarify that they were actually slightly different by erythromycin S so both are contaminants  NO NEED for her to come back to the hospital  I HAD left message for sister to bring her back and then another one saysing she did not need to come back

## 2020-02-23 LAB — CULTURE, BLOOD (ROUTINE X 2)
Culture: NO GROWTH
Culture: NO GROWTH
Special Requests: ADEQUATE

## 2020-04-01 ENCOUNTER — Telehealth: Payer: Medicare (Managed Care) | Admitting: Physician Assistant

## 2020-04-01 NOTE — Progress Notes (Unsigned)
{Choose 1 Note Type (Video or Telephone):[phone]}    Date:  04/01/2020   ID:  Shannon Garrett, DOB 1961/01/07, MRN [id number] The patient was identified using 2 identifiers.  {Patient [phone]::"Home"} {Provider Location:[phone]::"Home Office"}  PCP:  Gildardo Cranker, DO  Cardiologist:  Peter Martinique, MD *** Electrophysiologist:  None   Evaluation Performed:  {Choose Visit Type:[phone]::"Follow-Up Visit"}  Chief Complaint:  ***  History of Present Illness:    Shannon Garrett is a 59 y.o. female with past medical history of hypertension, chronic combined heart failure, insulin-dependent DM 2, multiple sclerosis with bedbound status and depression.  Echocardiogram obtained in January 2021 showed EF 40 to 45%, global hypokinesis, mild MR, mild to moderate TR, moderately elevated PA systolic pressure.  Myoview performed in March 2021 revealed moderate reversible defect involving the mid lateral wall and a fixed defect involving the apex and the septum.  As part of her preoperative clearance, it was recommended she proceed with cardiac catheterization.  Cardiac catheterization performed on 10/29/2019 showed EF 45 to 50%, no aortic valve stenosis, angiographically normal coronary arteries with minimally elevated LVEDP.  She ultimately underwent laparoscopic cholecystectomy on 02/12/2020.  The patient {does/does not:200015} have symptoms concerning for COVID-19 infection (fever, chills, cough, or new shortness of breath).    Past Medical History:  Diagnosis Date  . Cardiomyopathy ([place])    a.  Echo 04/29/12: Mild LVH, EF 20-25%, mild AI, moderate MR, moderate LAE, mild RAE, mild RVE, moderate TR, PASP 51, small pericardial effusion;   b. probably non-ischemic given multiple chemo-Tx agents used for MS and global LV dysfn on echo  . Chronic systolic heart failure ([place])   . COVID-19 05/10/2019   Per facility paperwork  . Decubitus ulcer    stage II sacral decubitus ulcer.   .  Depression   . Glaucoma   . Hypertension   . MS (multiple sclerosis) ([place])    a. Dx'd late 20's. b. Tx with Novantrone, Tysabri, Copaxone previously.   Past Surgical History:  Procedure Laterality Date  . ABLATION     uterine  . BILIARY DILATION  02/15/2020   Procedure: BILIARY DILATION;  Surgeon: Rush Landmark Telford Nab., MD;  Location: [facility];  Service: Gastroenterology;;  . CESAREAN SECTION    . CHOLECYSTECTOMY N/A 07/20/2019   Procedure: DIAGNOSTIC LAPAROSCOPY;  Surgeon: [name], MD;  Location: [facility];  Service: General;  Laterality: N/A;  . CHOLECYSTECTOMY N/A 02/12/2020   Procedure: LAPAROSCOPIC CHOLECYSTECTOMY;  Surgeon: Clovis Riley, MD;  Location: [facility];  Service: General;  Laterality: N/A;  . ERCP N/A 02/15/2020   Procedure: ENDOSCOPIC RETROGRADE CHOLANGIOPANCREATOGRAPHY (ERCP);  Surgeon: Irving Copas., MD;  Location: [facility];  Service: Gastroenterology;  Laterality: N/A;  . IR EXCHANGE BILIARY DRAIN  09/07/2019  . IR PERC CHOLECYSTOSTOMY  07/21/2019  . LEFT HEART CATH AND CORONARY ANGIOGRAPHY N/A 10/29/2019   Procedure: LEFT HEART CATH AND CORONARY ANGIOGRAPHY;  Surgeon: Leonie Man, MD;  Location: [facility];  Service: Cardiovascular;  Laterality: N/A;  . REMOVAL OF STONES  02/15/2020   Procedure: REMOVAL OF STONES;  Surgeon: Rush Landmark Telford Nab., MD;  Location: [facility];  Service: Gastroenterology;;  . Joan Mayans  02/15/2020   Procedure: Joan Mayans;  Surgeon: Irving Copas., MD;  Location: [facility];  Service: Gastroenterology;;     No outpatient medications have been marked as taking for the 04/01/20 encounter (Appointment) with Almyra Deforest, [place].     Allergies:   Sulfonamide derivatives and Penicillins   Social History  Tobacco Use  . Smoking status: Former Smoker    Packs/day: 0.50    Years: 5.00    Pack years: 2.50  . Smokeless tobacco: Never Used  Vaping Use  . Vaping Use: Never used  Substance Use  Topics  . Alcohol use: No  . Drug use: No     Family Hx: The patient's family history includes Cancer in her father and mother; Hypertension in her mother; Multiple sclerosis in her sister. There is no history of Heart attack.  ROS:   Please see the history of present illness.    *** All other systems reviewed and are negative.   Prior CV studies:   The following studies were reviewed today:  ***  Labs/Other Tests and Data Reviewed:    EKG:  {EKG/Telemetry Strips Reviewed:[phone]}  Recent Labs: 05/10/2019: TSH 2.526 05/15/2019: B Natriuretic Peptide 682.7 07/23/2019: Magnesium 2.2 02/19/2020: Hemoglobin 9.8; Platelets 292 02/20/2020: ALT 85; BUN 12; Creatinine, Ser 0.70; Potassium 4.6; Sodium 144   Recent Lipid Panel Lab Results  Component Value Date/Time   CHOL 183 12/10/2017 12:00 AM   TRIG 273 (H) 05/10/2019 02:52 PM   HDL 31 (A) 12/10/2017 12:00 AM   CHOLHDL 6.3 05/01/2012 05:00 AM   LDLCALC 98 12/10/2017 12:00 AM    Wt Readings from Last 3 Encounters:  02/15/20 249 lb 12.5 oz (113.3 kg)  10/29/19 200 lb (90.7 kg)  08/19/19 211 lb (95.7 kg)     Objective:    Vital Signs:  There were no vitals taken for this visit.   {[facility] Virtual Exam (Optional):[phone]::"VITAL SIGNS:  reviewed"}  ASSESSMENT & PLAN:    1. ***  COVID-19 Education: The signs and symptoms of COVID-19 were discussed with the patient and how to seek care for testing (follow up with PCP or arrange E-visit).  ***The importance of social distancing was discussed today.  Time:   Today, I have spent *** minutes with the patient with telehealth technology discussing the above problems.     Medication Adjustments/Labs and Tests Ordered: Current medicines are reviewed at length with the patient today.  Concerns regarding medicines are outlined above.   Tests Ordered: No orders of the defined types were placed in this encounter.   Medication Changes: No orders of the defined types  were placed in this encounter.   Follow Up:  {F/U Format:[id number]} {follow up:15908}  Hilbert Corrigan, PA  04/01/2020 10:30 AM    [facility]

## 2020-04-12 ENCOUNTER — Ambulatory Visit: Payer: Medicare (Managed Care) | Admitting: Gastroenterology

## 2020-04-28 ENCOUNTER — Ambulatory Visit: Payer: Medicare (Managed Care) | Admitting: Gastroenterology

## 2020-05-19 ENCOUNTER — Encounter (HOSPITAL_COMMUNITY): Payer: Self-pay

## 2020-05-19 ENCOUNTER — Emergency Department (HOSPITAL_COMMUNITY): Payer: Medicare (Managed Care)

## 2020-05-19 ENCOUNTER — Other Ambulatory Visit: Payer: Self-pay

## 2020-05-19 ENCOUNTER — Inpatient Hospital Stay (HOSPITAL_COMMUNITY)
Admission: EM | Admit: 2020-05-19 | Discharge: 2020-05-21 | DRG: 309 | Disposition: A | Discharge status: Skilled Nursing Facility | Payer: Medicare (Managed Care) | Source: Skilled Nursing Facility | Attending: Internal Medicine | Admitting: Internal Medicine

## 2020-05-19 DIAGNOSIS — I110 Hypertensive heart disease with heart failure: Secondary | ICD-10-CM | POA: Diagnosis present

## 2020-05-19 DIAGNOSIS — E109 Type 1 diabetes mellitus without complications: Secondary | ICD-10-CM | POA: Diagnosis present

## 2020-05-19 DIAGNOSIS — I447 Left bundle-branch block, unspecified: Secondary | ICD-10-CM | POA: Diagnosis present

## 2020-05-19 DIAGNOSIS — T383X6A Underdosing of insulin and oral hypoglycemic [antidiabetic] drugs, initial encounter: Secondary | ICD-10-CM | POA: Diagnosis present

## 2020-05-19 DIAGNOSIS — I5032 Chronic diastolic (congestive) heart failure: Secondary | ICD-10-CM | POA: Diagnosis present

## 2020-05-19 DIAGNOSIS — Z20822 Contact with and (suspected) exposure to covid-19: Secondary | ICD-10-CM | POA: Diagnosis present

## 2020-05-19 DIAGNOSIS — Z8616 Personal history of COVID-19: Secondary | ICD-10-CM

## 2020-05-19 DIAGNOSIS — T500X6A Underdosing of mineralocorticoids and their antagonists, initial encounter: Secondary | ICD-10-CM | POA: Diagnosis present

## 2020-05-19 DIAGNOSIS — F32A Depression, unspecified: Secondary | ICD-10-CM | POA: Diagnosis present

## 2020-05-19 DIAGNOSIS — T496X6A Underdosing of otorhinolaryngological drugs and preparations, initial encounter: Secondary | ICD-10-CM | POA: Diagnosis present

## 2020-05-19 DIAGNOSIS — Z9049 Acquired absence of other specified parts of digestive tract: Secondary | ICD-10-CM

## 2020-05-19 DIAGNOSIS — R079 Chest pain, unspecified: Secondary | ICD-10-CM

## 2020-05-19 DIAGNOSIS — Z820 Family history of epilepsy and other diseases of the nervous system: Secondary | ICD-10-CM

## 2020-05-19 DIAGNOSIS — Z66 Do not resuscitate: Secondary | ICD-10-CM | POA: Diagnosis present

## 2020-05-19 DIAGNOSIS — I10 Essential (primary) hypertension: Secondary | ICD-10-CM | POA: Diagnosis present

## 2020-05-19 DIAGNOSIS — I5022 Chronic systolic (congestive) heart failure: Secondary | ICD-10-CM | POA: Diagnosis present

## 2020-05-19 DIAGNOSIS — T470X6A Underdosing of histamine H2-receptor blockers, initial encounter: Secondary | ICD-10-CM | POA: Diagnosis present

## 2020-05-19 DIAGNOSIS — E271 Primary adrenocortical insufficiency: Secondary | ICD-10-CM | POA: Diagnosis present

## 2020-05-19 DIAGNOSIS — Z880 Allergy status to penicillin: Secondary | ICD-10-CM

## 2020-05-19 DIAGNOSIS — I471 Supraventricular tachycardia: Principal | ICD-10-CM | POA: Diagnosis present

## 2020-05-19 DIAGNOSIS — Z79899 Other long term (current) drug therapy: Secondary | ICD-10-CM

## 2020-05-19 DIAGNOSIS — I959 Hypotension, unspecified: Secondary | ICD-10-CM | POA: Diagnosis present

## 2020-05-19 DIAGNOSIS — T446X6A Underdosing of alpha-adrenoreceptor antagonists, initial encounter: Secondary | ICD-10-CM | POA: Diagnosis present

## 2020-05-19 DIAGNOSIS — T428X6A Underdosing of antiparkinsonism drugs and other central muscle-tone depressants, initial encounter: Secondary | ICD-10-CM | POA: Diagnosis present

## 2020-05-19 DIAGNOSIS — R6510 Systemic inflammatory response syndrome (SIRS) of non-infectious origin without acute organ dysfunction: Secondary | ICD-10-CM | POA: Diagnosis present

## 2020-05-19 DIAGNOSIS — G35 Multiple sclerosis: Secondary | ICD-10-CM | POA: Diagnosis present

## 2020-05-19 DIAGNOSIS — T426X6A Underdosing of other antiepileptic and sedative-hypnotic drugs, initial encounter: Secondary | ICD-10-CM | POA: Diagnosis present

## 2020-05-19 DIAGNOSIS — Z8042 Family history of malignant neoplasm of prostate: Secondary | ICD-10-CM

## 2020-05-19 DIAGNOSIS — G894 Chronic pain syndrome: Secondary | ICD-10-CM | POA: Diagnosis present

## 2020-05-19 DIAGNOSIS — Z794 Long term (current) use of insulin: Secondary | ICD-10-CM

## 2020-05-19 DIAGNOSIS — K5900 Constipation, unspecified: Secondary | ICD-10-CM | POA: Diagnosis present

## 2020-05-19 DIAGNOSIS — T472X6A Underdosing of stimulant laxatives, initial encounter: Secondary | ICD-10-CM | POA: Diagnosis present

## 2020-05-19 DIAGNOSIS — Z8249 Family history of ischemic heart disease and other diseases of the circulatory system: Secondary | ICD-10-CM

## 2020-05-19 DIAGNOSIS — Z6841 Body Mass Index (BMI) 40.0 and over, adult: Secondary | ICD-10-CM

## 2020-05-19 DIAGNOSIS — K219 Gastro-esophageal reflux disease without esophagitis: Secondary | ICD-10-CM | POA: Diagnosis present

## 2020-05-19 DIAGNOSIS — I2510 Atherosclerotic heart disease of native coronary artery without angina pectoris: Secondary | ICD-10-CM | POA: Diagnosis present

## 2020-05-19 DIAGNOSIS — R9431 Abnormal electrocardiogram [ECG] [EKG]: Secondary | ICD-10-CM | POA: Diagnosis not present

## 2020-05-19 DIAGNOSIS — T503X6A Underdosing of electrolytic, caloric and water-balance agents, initial encounter: Secondary | ICD-10-CM | POA: Diagnosis present

## 2020-05-19 DIAGNOSIS — R627 Adult failure to thrive: Secondary | ICD-10-CM | POA: Diagnosis present

## 2020-05-19 DIAGNOSIS — Z91128 Patient's intentional underdosing of medication regimen for other reason: Secondary | ICD-10-CM

## 2020-05-19 DIAGNOSIS — Z803 Family history of malignant neoplasm of breast: Secondary | ICD-10-CM

## 2020-05-19 DIAGNOSIS — T447X6A Underdosing of beta-adrenoreceptor antagonists, initial encounter: Secondary | ICD-10-CM | POA: Diagnosis present

## 2020-05-19 DIAGNOSIS — Z882 Allergy status to sulfonamides status: Secondary | ICD-10-CM

## 2020-05-19 DIAGNOSIS — I420 Dilated cardiomyopathy: Secondary | ICD-10-CM | POA: Diagnosis present

## 2020-05-19 DIAGNOSIS — H409 Unspecified glaucoma: Secondary | ICD-10-CM | POA: Diagnosis present

## 2020-05-19 LAB — CBC WITH DIFFERENTIAL/PLATELET
Abs Immature Granulocytes: 0.01 10*3/uL (ref 0.00–0.07)
Basophils Absolute: 0.1 10*3/uL (ref 0.0–0.1)
Basophils Relative: 2 %
Eosinophils Absolute: 0.5 10*3/uL (ref 0.0–0.5)
Eosinophils Relative: 8 %
HCT: 42.2 % (ref 36.0–46.0)
Hemoglobin: 13.9 g/dL (ref 12.0–15.0)
Immature Granulocytes: 0 %
Lymphocytes Relative: 35 %
Lymphs Abs: 2.5 10*3/uL (ref 0.7–4.0)
MCH: 28.1 pg (ref 26.0–34.0)
MCHC: 32.9 g/dL (ref 30.0–36.0)
MCV: 85.4 fL (ref 80.0–100.0)
Monocytes Absolute: 0.5 10*3/uL (ref 0.1–1.0)
Monocytes Relative: 6 %
Neutro Abs: 3.5 10*3/uL (ref 1.7–7.7)
Neutrophils Relative %: 49 %
Platelets: 369 10*3/uL (ref 150–400)
RBC: 4.94 MIL/uL (ref 3.87–5.11)
RDW: 16.3 % — ABNORMAL HIGH (ref 11.5–15.5)
WBC: 7.1 10*3/uL (ref 4.0–10.5)
nRBC: 0 % (ref 0.0–0.2)

## 2020-05-19 LAB — LIPASE, BLOOD: Lipase: 22 U/L (ref 11–51)

## 2020-05-19 LAB — COMPREHENSIVE METABOLIC PANEL
ALT: 35 U/L (ref 0–44)
AST: 37 U/L (ref 15–41)
Albumin: 4.2 g/dL (ref 3.5–5.0)
Alkaline Phosphatase: 342 U/L — ABNORMAL HIGH (ref 38–126)
Anion gap: 13 (ref 5–15)
BUN: 11 mg/dL (ref 6–20)
CO2: 25 mmol/L (ref 22–32)
Calcium: 9.5 mg/dL (ref 8.9–10.3)
Chloride: 107 mmol/L (ref 98–111)
Creatinine, Ser: 0.9 mg/dL (ref 0.44–1.00)
GFR, Estimated: >60 mL/min (ref >60)
Glucose, Bld: 123 mg/dL — ABNORMAL HIGH (ref 70–99)
Potassium: 4.4 mmol/L (ref 3.5–5.1)
Sodium: 145 mmol/L (ref 135–145)
Total Bilirubin: 0.4 mg/dL (ref 0.3–1.2)
Total Protein: 8 g/dL (ref 6.5–8.1)

## 2020-05-19 LAB — TROPONIN I (HIGH SENSITIVITY)
Troponin I (High Sensitivity): 14 ng/L (ref <18)
Troponin I (High Sensitivity): 35 ng/L — ABNORMAL HIGH (ref <18)

## 2020-05-19 MED ORDER — SODIUM CHLORIDE 0.9 % IV BOLUS
500.0000 mL | Freq: Once | INTRAVENOUS | Status: AC
  Administered 2020-05-19: 500 mL via INTRAVENOUS

## 2020-05-19 MED ORDER — IOHEXOL 350 MG/ML SOLN
100.0000 mL | Freq: Once | INTRAVENOUS | Status: AC | PRN
  Administered 2020-05-19: 100 mL via INTRAVENOUS

## 2020-05-19 NOTE — ED Triage Notes (Signed)
Pt resides at [facility], patient been complaining of weakness and chest discomfort today. 1 hour PTA patient became diaphoretic. Recent gallbladder surgery

## 2020-05-19 NOTE — ED Provider Notes (Signed)
[facility] EMERGENCY DEPARTMENT Provider Note  CSN: [id number] Arrival date & time: 05/19/20 1812    History Chief Complaint  Patient presents with  . Chest Pain    HPI  Shannon Garrett is a 59 y.o. female with history of MS with spastic paraplegia also recently had biliary obstruction and is now living at [facility] s/p cholecystectomy. She was brought to the ED today via EMS with reports of chest pain, palpitations and SOB at her facility. She became diaphoretic en route. Patient has otherwise been in her usual state of health.    Past Medical History:  Diagnosis Date  . Cardiomyopathy ([place])    a.  Echo 04/29/12: Mild LVH, EF 20-25%, mild AI, moderate MR, moderate LAE, mild RAE, mild RVE, moderate TR, PASP 51, small pericardial effusion;   b. probably non-ischemic given multiple chemo-Tx agents used for MS and global LV dysfn on echo  . Chronic systolic heart failure ([place])   . COVID-19 05/10/2019   Per facility paperwork  . Decubitus ulcer    stage II sacral decubitus ulcer.   . Depression   . Glaucoma   . Hypertension   . MS (multiple sclerosis) ([place])    a. Dx'd late 20's. b. Tx with Novantrone, Tysabri, Copaxone previously.    Past Surgical History:  Procedure Laterality Date  . ABLATION     uterine  . BILIARY DILATION  02/15/2020   Procedure: BILIARY DILATION;  Surgeon: Rush Landmark Telford Nab., MD;  Location: [facility];  Service: Gastroenterology;;  . CESAREAN SECTION    . CHOLECYSTECTOMY N/A 07/20/2019   Procedure: DIAGNOSTIC LAPAROSCOPY;  Surgeon: [name], MD;  Location: [facility];  Service: General;  Laterality: N/A;  . CHOLECYSTECTOMY N/A 02/12/2020   Procedure: LAPAROSCOPIC CHOLECYSTECTOMY;  Surgeon: Clovis Riley, MD;  Location: [facility];  Service: General;  Laterality: N/A;  . ERCP N/A 02/15/2020   Procedure: ENDOSCOPIC RETROGRADE CHOLANGIOPANCREATOGRAPHY (ERCP);  Surgeon: Irving Copas., MD;  Location: [facility];  Service: Gastroenterology;   Laterality: N/A;  . IR EXCHANGE BILIARY DRAIN  09/07/2019  . IR PERC CHOLECYSTOSTOMY  07/21/2019  . LEFT HEART CATH AND CORONARY ANGIOGRAPHY N/A 10/29/2019   Procedure: LEFT HEART CATH AND CORONARY ANGIOGRAPHY;  Surgeon: Leonie Man, MD;  Location: [facility];  Service: Cardiovascular;  Laterality: N/A;  . REMOVAL OF STONES  02/15/2020   Procedure: REMOVAL OF STONES;  Surgeon: Rush Landmark Telford Nab., MD;  Location: [facility];  Service: Gastroenterology;;  . Joan Mayans  02/15/2020   Procedure: Joan Mayans;  Surgeon: Mansouraty, Telford Nab., MD;  Location: [facility];  Service: Gastroenterology;;    Family History  Problem Relation Age of Onset  . Hypertension Mother   . Cancer Mother        breast   . Cancer Father        prostate  . Multiple sclerosis Sister   . Heart attack Neg Hx     Social History   Tobacco Use  . Smoking status: Former Smoker    Packs/day: 0.50    Years: 5.00    Pack years: 2.50  . Smokeless tobacco: Never Used  Vaping Use  . Vaping Use: Never used  Substance Use Topics  . Alcohol use: No  . Drug use: No     Home Medications Prior to Admission medications   Medication Sig Start Date End Date Taking? Authorizing Provider  acetaminophen (TYLENOL) 325 MG tablet Take 650 mg by mouth every 8 (eight) hours as needed for mild  pain, moderate pain or headache.    Yes [provider]  albuterol (PROVENTIL) (2.5 MG/3ML) 0.083% nebulizer solution Take 2.5 mg by nebulization every 6 (six) hours as needed for wheezing or shortness of breath.   Yes [provider]  albuterol (VENTOLIN HFA) 108 (90 Base) MCG/ACT inhaler Inhale 2 puffs into the lungs every 6 (six) hours as needed for wheezing or shortness of breath.  07/10/19  Yes [provider]  amantadine (SYMMETREL) 100 MG capsule Take 100 mg by mouth daily.    Yes [provider]  ascorbic acid (VITAMIN C) 500 MG tablet Take 500 mg by mouth daily.    Yes  [provider]  aspirin 325 MG EC tablet Take 325 mg by mouth daily.   Yes [provider]  Baclofen 5 MG TABS Take 5 mg by mouth 4 (four) times daily. Hold if lethargic or confused  12/04/17  Yes [provider]  buPROPion (WELLBUTRIN SR) 150 MG 12 hr tablet Take 150 mg by mouth daily. 01/23/20  Yes [provider]  Cranberry 400 MG TABS Take 400 mg by mouth daily.    Yes [provider]  diphenhydrAMINE (BENADRYL) 2 % cream Apply 1 application topically every 12 (twelve) hours as needed for itching.   Yes [provider]  diphenhydrAMINE (BENADRYL) 25 mg capsule Take 25 mg by mouth every 6 (six) hours as needed for itching or allergies (sob).    Yes [provider]  DULoxetine (CYMBALTA) 60 MG capsule Take 60 mg by mouth daily. 03/29/20  Yes [provider]  Ensure (ENSURE) Take 237 mLs by mouth 2 (two) times daily between meals.   Yes [provider]  famotidine (PEPCID) 20 MG tablet Take 1 tablet (20 mg total) by mouth 2 (two) times daily. 07/25/15  Yes Rai, Ripudeep K, MD  ferrous sulfate 325 (65 FE) MG tablet Take 325 mg by mouth 2 (two) times daily with a meal.  02/20/18  Yes [provider]  fluticasone (FLONASE) 50 MCG/ACT nasal spray Place 2 sprays into both nostrils every evening.   Yes [provider]  gabapentin (NEURONTIN) 800 MG tablet Take 800 mg by mouth 3 (three) times daily.   Yes [provider]  hydrocortisone cream 1 % Apply 1 application topically every 12 (twelve) hours as needed for itching.   Yes [provider]  insulin aspart (NOVOLOG FLEXPEN) 100 UNIT/ML FlexPen 0-15 Units, Subcutaneous, 3 times daily with meals CBG < 70: implement hypoglycemia protocol-call MD CBG 70 - 120: 0 units CBG 121 - 150: 2 units CBG 151 - 200: 3 units CBG 201 - 250: 5 units CBG 251 - 300: 8 units CBG 301 - 350: 11 units CBG 351 - 400: 15 units CBG > 400: Patient taking  differently: Inject 0-15 Units into the skin 3 (three) times daily with meals. 0-15 Units, Subcutaneous, 3 times daily with meals CBG < 70: implement hypoglycemia protocol-call MD CBG 0- 120: 0 units CBG 121 - 150: 2 units CBG 151 - 200: 3 units CBG 201 - 250: 5 units CBG 251 - 300: 8 units CBG 301 - 400: 15 units CBG >400, CALL MD 05/18/19  Yes Ghimire, Henreitta Leber, MD  insulin glargine (LANTUS) 100 UNIT/ML injection Inject 0.1 mLs (10 Units total) into the skin daily. Patient taking differently: Inject 10 Units into the skin at bedtime.  05/19/19  Yes Ghimire, Henreitta Leber, MD  LANTUS SOLOSTAR 100 UNIT/ML Solostar Pen Inject 10 Units  into the skin at bedtime.  04/23/20  Yes [provider]  latanoprost (XALATAN) 0.005 % ophthalmic solution Place 1 drop into both eyes at bedtime.   Yes [provider]  lubiprostone (AMITIZA) 24 MCG capsule Take 24 mcg by mouth 2 (two) times daily with a meal.   Yes [provider]  Melatonin 5 MG TABS Take 10 mg by mouth at bedtime.  11/14/17  Yes [provider]  metoprolol succinate (TOPROL-XL) 100 MG 24 hr tablet Take 150 mg by mouth daily. Take with or immediately following a meal.   Yes [provider]  Multiple Vitamins-Minerals (MULTIVITAMIN ADULT PO) Take 1 tablet by mouth daily.   Yes [provider]  omeprazole (PRILOSEC) 20 MG capsule Take 20 mg by mouth daily.   Yes [provider]  ondansetron (ZOFRAN) 4 MG tablet Take 4 mg by mouth every 6 (six) hours as needed for nausea or vomiting. 11/01/17  Yes [provider]  Potassium Chloride ER 20 MEQ TBCR Take 1 tablet by mouth daily. 05/17/20  Yes [provider]  Probiotic Product (PROBIOTIC PO) Take 1 capsule by mouth daily.   Yes [provider]  senna-docusate (SENOKOT S) 8.6-50 MG tablet Take 2 tablets by mouth 2 (two) times daily.    Yes [provider]  simethicone (MYLICON) 106 MG chewable tablet Chew 125  mg by mouth every 6 (six) hours as needed for flatulence.   Yes [provider]  spironolactone (ALDACTONE) 25 MG tablet Take 25 mg by mouth daily. 05/02/20  Yes [provider]  tamsulosin (FLOMAX) 0.4 MG CAPS capsule Take 0.4 mg by mouth daily. For incontinence   Yes [provider]  traMADol (ULTRAM) 50 MG tablet Take 100 mg by mouth every 6 (six) hours as needed (Pain).   Yes [provider]  diphenhydrAMINE-zinc acetate (BENADRYL) cream Apply topically 2 (two) times daily as needed for itching (to dermatitis around two upper incisions). Patient not taking: Reported on 05/19/2020 02/19/20   Clovis Riley, MD  fluconazole (DIFLUCAN) 200 MG tablet Take 1 tablet (200 mg total) by mouth daily. Patient not taking: Reported on 05/19/2020 2/69/48   Leighton Ruff, MD     Allergies    Sulfonamide derivatives and Penicillins   Review of Systems   Review of Systems A comprehensive review of systems was completed and negative except as noted in HPI.    Physical Exam BP (!) 160/95   Pulse (!) 119   Temp 98.3 F (36.8 C) (Oral)   Resp 20   Ht 5\' 4"  (1.626 m)   Wt 113 kg   SpO2 99%   BMI 42.76 kg/m   Physical Exam Vitals and nursing note reviewed.  Constitutional:      Appearance: Normal appearance. She is diaphoretic.  HENT:     Head: Normocephalic and atraumatic.     Nose: Nose normal.     Mouth/Throat:     Mouth: Mucous membranes are moist.  Eyes:     Extraocular Movements: Extraocular movements intact.     Conjunctiva/sclera: Conjunctivae normal.  Cardiovascular:     Rate and Rhythm: Tachycardia present.  Pulmonary:     Effort: Pulmonary effort is normal.     Breath sounds: Normal breath sounds.  Abdominal:     General: Abdomen is flat.     Palpations: Abdomen is soft.     Tenderness: There is no abdominal tenderness.  Musculoskeletal:        General: No  swelling.     Cervical back: Neck supple.  Skin:    General: Skin is warm.    Neurological:     Mental Status: She is alert.     Comments: Spastic paraplegia, contractures of extremities  Psychiatric:        Mood and Affect: Mood normal.      ED Results / Procedures / Treatments   Labs (all labs ordered are listed, but only abnormal results are displayed) Labs Reviewed  COMPREHENSIVE METABOLIC PANEL - Abnormal; Notable for the following components:      Result Value   Glucose, Bld 123 (*)    Alkaline Phosphatase 342 (*)    All other components within normal limits  CBC WITH DIFFERENTIAL/PLATELET - Abnormal; Notable for the following components:   RDW 16.3 (*)    All other components within normal limits  TROPONIN I (HIGH SENSITIVITY) - Abnormal; Notable for the following components:   Troponin I (High Sensitivity) 35 (*)    All other components within normal limits  RESP PANEL BY RT-PCR (FLU A&B, COVID) ARPGX2  LIPASE, BLOOD  URINALYSIS, ROUTINE W REFLEX MICROSCOPIC  TROPONIN I (HIGH SENSITIVITY)    EKG EKG Interpretation  Date/Time:  Thursday May 19 2020 18:17:58 EST Ventricular Rate:  135 PR Interval:    QRS Duration: 150 QT Interval:  359 QTC Calculation: 539 R Axis:   -54 Text Interpretation: Sinus or ectopic atrial tachycardia Left bundle branch block Artifact in lead(s) III aVL Since last tracing Left bundle branch block NOW PRESENT Rate faster Confirmed by Calvert Cantor [phone]) on 05/19/2020 7:01:23 PM   Radiology CT Angio Chest PE W/Cm &/Or Wo Cm  Result Date: 05/19/2020 CLINICAL DATA:  Complaining of weakness and chest discomfort EXAM: CT ANGIOGRAPHY CHEST WITH CONTRAST TECHNIQUE: Multidetector CT imaging of the chest was performed using the standard protocol during bolus administration of intravenous contrast. Multiplanar CT image reconstructions and MIPs were obtained to evaluate the vascular anatomy. CONTRAST:  182mL OMNIPAQUE IOHEXOL 350 MG/ML SOLN COMPARISON:  None. FINDINGS: Cardiovascular: There is a optimal  opacification of the pulmonary arteries. There is no central,segmental, or subsegmental filling defects within the pulmonary arteries. There is mild cardiomegaly present. No pericardial effusion or thickening. No evidence right heart strain. There is normal three-vessel brachiocephalic anatomy without proximal stenosis. Scattered mild aortic atherosclerosis seen. Mediastinum/Nodes: No hilar, mediastinal, or axillary adenopathy. Thyroid gland, trachea, and esophagus demonstrate no significant findings. Lungs/Pleura: Streaky atelectasis seen at both lung bases. No pleural effusion or pneumothorax. No airspace consolidation. Upper Abdomen: No acute abnormalities present in the visualized portions of the upper abdomen. Musculoskeletal: No chest wall abnormality. No acute or significant osseous findings. Review of the MIP images confirms the above findings. IMPRESSION: No central, segmental, or subsegmental pulmonary embolism. Mild cardiomegaly. No other acute intrathoracic pathology to explain the patient's symptoms. Electronically Signed   By: Prudencio Pair M.D.   On: 05/19/2020 22:42   DG Chest Port 1 View  Result Date: 05/19/2020 CLINICAL DATA:  Chest pain, shortness of breath EXAM: PORTABLE CHEST 1 VIEW COMPARISON:  02/15/2020 FINDINGS: Heart is borderline in size. Lungs clear. No effusions or edema. No acute bony abnormality. IMPRESSION: No active disease. Electronically Signed   By: Rolm Baptise M.D.   On: 05/19/2020 19:14    Procedures Procedures  Medications Ordered in the ED Medications  sodium chloride 0.9 % bolus 500 mL (500 mLs Intravenous New Bag/Given 05/19/20 2156)  iohexol (OMNIPAQUE) 350 MG/ML injection 100 mL (100 mLs Intravenous Contrast  Given 05/19/20 2158)     MDM Rules/Calculators/A&P MDM Initial EKG with LBBB, new from August. Discussed with Dr. Gwenlyn Found, Cardiologist on call for STEMI, who has reviewed heart cath she had in April this year with clean coronaries. Does not recommend  activating Code Stemi, recommends further ED evaluation for underlying etiology.  ED Course  I have reviewed the triage vital signs and the nursing notes.  Pertinent labs & imaging results that were available during my care of the patient were reviewed by me and considered in my medical decision making (see chart for details).  Clinical Course as of May 19 2310  Thu May 19, 2020  1943 CBC normal, CMP and Trop neg. Lipase neg. CXR is clear. HR improving.    [CS]  2130 Second Trop pending, will discuss admission with Hospitalist. Still mildly tachycardic, has history of EF but appears clinically dry, will give small volume fluid bolus to see if that helps.    [CS]  2136 Spoke with Dr. Jonelle Sidle, who will see the patient for admission, requests a CTA to rule out PE given continue tachycardia of unknown cause. Patient not currently having CP and not hypoxic.    [CS]    Clinical Course User Index [CS] Truddie Hidden, MD    Final Clinical Impression(s) / ED Diagnoses Final diagnoses:  Chest pain, unspecified type  Left bundle branch block    Rx / DC Orders ED Discharge Orders    None       Truddie Hidden, MD 05/19/20 2312

## 2020-05-19 NOTE — H&P (Signed)
History and Physical   Shannon Garrett ONG:[id number] DOB: [birth year] DOA: 05/19/2020  Referring MD/NP/PA: Dr. Karle Starch  PCP: Gildardo Cranker, DO   Patient coming from: [facility] skilled facility  Chief Complaint: Weakness and chest discomfort  HPI: Shannon Garrett is a 59 y.o. female with medical history significant of coronary artery disease, history of cardiomyopathy with EF of 20 to 25%, history of Covid pneumonia last year, chronic systolic dysfunction, history of sacral decubitus ulcer, diabetes, hypertension, multiple sclerosis and recent cardiac catheterization in April of this year with clean coronaries who is DNR.  Patient resides in a skilled facility and was brought in today with weakness and chest discomfort.  Her enzymes and EKG did not show evidence of acute MI.  Evaluated for possible decompensation of her cardiac status.  Cardiology consulted by ER and suggest noncardiac issue due to recent clean coronaries.  Patient has remained persistently tachycardic heart rates in the 130s.  She appears to be having supraventricular tachycardia.  CT angiogram shows no evidence of PE.  At this point patient is being admitted was SVT probably due to SIRS.  No evidence of acute infection.  Urinalysis currently pending..  ED Course: Temperature 98.3 blood pressure 174/109 pulse 133 respirate of 36 oxygen sat 93% room air.  CBC and chemistry entirely within normal.  Troponin initially 14 seconds is 35.  Glucose 123.  Chest x-ray and CT angiogram of the chest both within normal.  EKG shows sinus tachycardia with new left bundle branch block compared with previous EKG.  Patient being admitted for evaluation and treatment.  Review of Systems: As per HPI otherwise 10 point review of systems negative.    Past Medical History:  Diagnosis Date  . Cardiomyopathy ([place])    a.  Echo 04/29/12: Mild LVH, EF 20-25%, mild AI, moderate MR, moderate LAE, mild RAE, mild RVE, moderate TR, PASP 51, small  pericardial effusion;   b. probably non-ischemic given multiple chemo-Tx agents used for MS and global LV dysfn on echo  . Chronic systolic heart failure ([place])   . COVID-19 05/10/2019   Per facility paperwork  . Decubitus ulcer    stage II sacral decubitus ulcer.   . Depression   . Glaucoma   . Hypertension   . MS (multiple sclerosis) ([place])    a. Dx'd late 20's. b. Tx with Novantrone, Tysabri, Copaxone previously.    Past Surgical History:  Procedure Laterality Date  . ABLATION     uterine  . BILIARY DILATION  02/15/2020   Procedure: BILIARY DILATION;  Surgeon: Rush Landmark Telford Nab., MD;  Location: [facility];  Service: Gastroenterology;;  . CESAREAN SECTION    . CHOLECYSTECTOMY N/A 07/20/2019   Procedure: DIAGNOSTIC LAPAROSCOPY;  Surgeon: [name], MD;  Location: [facility];  Service: General;  Laterality: N/A;  . CHOLECYSTECTOMY N/A 02/12/2020   Procedure: LAPAROSCOPIC CHOLECYSTECTOMY;  Surgeon: Clovis Riley, MD;  Location: [facility];  Service: General;  Laterality: N/A;  . ERCP N/A 02/15/2020   Procedure: ENDOSCOPIC RETROGRADE CHOLANGIOPANCREATOGRAPHY (ERCP);  Surgeon: Irving Copas., MD;  Location: [facility];  Service: Gastroenterology;  Laterality: N/A;  . IR EXCHANGE BILIARY DRAIN  09/07/2019  . IR PERC CHOLECYSTOSTOMY  07/21/2019  . LEFT HEART CATH AND CORONARY ANGIOGRAPHY N/A 10/29/2019   Procedure: LEFT HEART CATH AND CORONARY ANGIOGRAPHY;  Surgeon: Leonie Man, MD;  Location: [facility];  Service: Cardiovascular;  Laterality: N/A;  . REMOVAL OF STONES  02/15/2020   Procedure: REMOVAL OF STONES;  Surgeon:  Mansouraty, Telford Nab., MD;  Location: [facility];  Service: Gastroenterology;;  . Joan Mayans  02/15/2020   Procedure: Joan Mayans;  Surgeon: Mansouraty, Telford Nab., MD;  Location: [facility];  Service: Gastroenterology;;     reports that she has quit smoking. She has a 2.50 pack-year smoking history. She has never used smokeless  tobacco. She reports that she does not drink alcohol and does not use drugs.  Allergies  Allergen Reactions  . Sulfonamide Derivatives Hives and Shortness Of Breath  . Penicillins Hives and Itching    Prior course of rocephin charted 05/2015 Also note has tolerated Zosyn in past 2016 and 2017    Family History  Problem Relation Age of Onset  . Hypertension Mother   . Cancer Mother        breast   . Cancer Father        prostate  . Multiple sclerosis Sister   . Heart attack Neg Hx      Prior to Admission medications   Medication Sig Start Date End Date Taking? Authorizing Provider  acetaminophen (TYLENOL) 325 MG tablet Take 650 mg by mouth every 8 (eight) hours as needed for mild pain, moderate pain or headache.    Yes [provider]  albuterol (PROVENTIL) (2.5 MG/3ML) 0.083% nebulizer solution Take 2.5 mg by nebulization every 6 (six) hours as needed for wheezing or shortness of breath.   Yes [provider]  albuterol (VENTOLIN HFA) 108 (90 Base) MCG/ACT inhaler Inhale 2 puffs into the lungs every 6 (six) hours as needed for wheezing or shortness of breath.  07/10/19  Yes [provider]  amantadine (SYMMETREL) 100 MG capsule Take 100 mg by mouth daily.    Yes [provider]  ascorbic acid (VITAMIN C) 500 MG tablet Take 500 mg by mouth daily.    Yes [provider]  aspirin 325 MG EC tablet Take 325 mg by mouth daily.   Yes [provider]  Baclofen 5 MG TABS Take 5 mg by mouth 4 (four) times daily. Hold if lethargic or confused  12/04/17  Yes [provider]  buPROPion (WELLBUTRIN SR) 150 MG 12 hr tablet Take 150 mg by mouth daily. 01/23/20  Yes [provider]  Cranberry 400 MG TABS Take 400 mg by mouth daily.    Yes [provider]  diphenhydrAMINE (BENADRYL) 2 % cream Apply 1 application topically every 12 (twelve) hours as needed for itching.   Yes [provider]  diphenhydrAMINE (BENADRYL)  25 mg capsule Take 25 mg by mouth every 6 (six) hours as needed for itching or allergies (sob).    Yes [provider]  DULoxetine (CYMBALTA) 60 MG capsule Take 60 mg by mouth daily. 03/29/20  Yes [provider]  Ensure (ENSURE) Take 237 mLs by mouth 2 (two) times daily between meals.   Yes [provider]  famotidine (PEPCID) 20 MG tablet Take 1 tablet (20 mg total) by mouth 2 (two) times daily. 07/25/15  Yes Rai, Ripudeep K, MD  ferrous sulfate 325 (65 FE) MG tablet Take 325 mg by mouth 2 (two) times daily with a meal.  02/20/18  Yes [provider]  fluticasone (FLONASE) 50 MCG/ACT nasal spray Place 2 sprays into both nostrils every evening.   Yes [provider]  gabapentin (NEURONTIN) 800 MG tablet Take 800 mg by mouth 3 (three) times daily.   Yes [provider]  hydrocortisone cream 1 % Apply 1 application topically every 12 (twelve) hours  as needed for itching.   Yes [provider]  insulin aspart (NOVOLOG FLEXPEN) 100 UNIT/ML FlexPen 0-15 Units, Subcutaneous, 3 times daily with meals CBG < 70: implement hypoglycemia protocol-call MD CBG 70 - 120: 0 units CBG 121 - 150: 2 units CBG 151 - 200: 3 units CBG 201 - 250: 5 units CBG 251 - 300: 8 units CBG 301 - 350: 11 units CBG 351 - 400: 15 units CBG > 400: Patient taking differently: Inject 0-15 Units into the skin 3 (three) times daily with meals. 0-15 Units, Subcutaneous, 3 times daily with meals CBG < 70: implement hypoglycemia protocol-call MD CBG 0- 120: 0 units CBG 121 - 150: 2 units CBG 151 - 200: 3 units CBG 201 - 250: 5 units CBG 251 - 300: 8 units CBG 301 - 400: 15 units CBG >400, CALL MD 05/18/19  Yes Ghimire, Henreitta Leber, MD  insulin glargine (LANTUS) 100 UNIT/ML injection Inject 0.1 mLs (10 Units total) into the skin daily. Patient taking differently: Inject 10 Units into the skin at bedtime.  05/19/19  Yes Ghimire, Henreitta Leber, MD  LANTUS SOLOSTAR 100 UNIT/ML  Solostar Pen Inject 10 Units into the skin at bedtime.  04/23/20  Yes [provider]  latanoprost (XALATAN) 0.005 % ophthalmic solution Place 1 drop into both eyes at bedtime.   Yes [provider]  lubiprostone (AMITIZA) 24 MCG capsule Take 24 mcg by mouth 2 (two) times daily with a meal.   Yes [provider]  Melatonin 5 MG TABS Take 10 mg by mouth at bedtime.  11/14/17  Yes [provider]  metoprolol succinate (TOPROL-XL) 100 MG 24 hr tablet Take 150 mg by mouth daily. Take with or immediately following a meal.   Yes [provider]  Multiple Vitamins-Minerals (MULTIVITAMIN ADULT PO) Take 1 tablet by mouth daily.   Yes [provider]  omeprazole (PRILOSEC) 20 MG capsule Take 20 mg by mouth daily.   Yes [provider]  ondansetron (ZOFRAN) 4 MG tablet Take 4 mg by mouth every 6 (six) hours as needed for nausea or vomiting. 11/01/17  Yes [provider]  Potassium Chloride ER 20 MEQ TBCR Take 1 tablet by mouth daily. 05/17/20  Yes [provider]  Probiotic Product (PROBIOTIC PO) Take 1 capsule by mouth daily.   Yes [provider]  senna-docusate (SENOKOT S) 8.6-50 MG tablet Take 2 tablets by mouth 2 (two) times daily.    Yes [provider]  simethicone (MYLICON) 749 MG chewable tablet Chew 125 mg by mouth every 6 (six) hours as needed for flatulence.   Yes [provider]  spironolactone (ALDACTONE) 25 MG tablet Take 25 mg by mouth daily. 05/02/20  Yes [provider]  tamsulosin (FLOMAX) 0.4 MG CAPS capsule Take 0.4 mg by mouth daily. For incontinence   Yes [provider]  traMADol (ULTRAM) 50 MG tablet Take 100 mg by mouth every 6 (six) hours as needed (Pain).   Yes [provider]  diphenhydrAMINE-zinc acetate (BENADRYL) cream Apply topically 2 (two) times daily as needed for itching (to dermatitis around two upper incisions). Patient not taking: Reported on  05/19/2020 02/19/20   Clovis Riley, MD  fluconazole (DIFLUCAN) 200 MG tablet Take 1 tablet (200 mg total) by mouth daily. Patient not taking: Reported on 05/19/2020 4/49/67   Leighton Ruff, MD    Physical Exam: Vitals:   05/19/20 2045 05/19/20 2100 05/19/20 2130 05/19/20 2232  BP:  (!) 171/104 Marland Kitchen)  174/109 (!) 151/100  Pulse: (!) 120 (!) 128 (!) 133 (!) 120  Resp: (!) 30 (!) 28 (!) 22 20  Temp:      TempSrc:      SpO2: 100% 99% 99% 98%  Weight:      Height:          Constitutional: Chronically ill looking, bedbound status Vitals:   05/19/20 2045 05/19/20 2100 05/19/20 2130 05/19/20 2232  BP:  (!) 171/104 (!) 174/109 (!) 151/100  Pulse: (!) 120 (!) 128 (!) 133 (!) 120  Resp: (!) 30 (!) 28 (!) 22 20  Temp:      TempSrc:      SpO2: 100% 99% 99% 98%  Weight:      Height:       Eyes: PERRL, lids and conjunctivae normal ENMT: Mucous membranes are dry posterior pharynx clear of any exudate or lesions.Normal dentition.  Neck: normal, supple, no masses, no thyromegaly Respiratory: clear to auscultation bilaterally, no wheezing, no crackles. Normal respiratory effort. No accessory muscle use.  Cardiovascular: Sinus tachycardia,, grade 3 out of 5 systolic murmur/ rubs / gallops. No extremity edema. 2+ pedal pulses. No carotid bruits.  Abdomen: no tenderness, no masses palpated. No hepatosplenomegaly. Bowel sounds positive.  Musculoskeletal: no clubbing / cyanosis. No joint deformity upper and lower extremities.  Marked muscle wasting and global weakness  skin: no rashes, lesions, stage II sacral decubitus ulcer. No induration Neurologic: CN 2-12 grossly intact. Sensation intact, DTR normal. Strength 5/5 in all 4.  Psychiatric: Normal judgment and insight. Alert and oriented x 3. Normal mood.     Labs on Admission: I have personally reviewed following labs and imaging studies  CBC: Recent Labs  Lab 05/19/20 1844  WBC 7.1  NEUTROABS 3.5  HGB 13.9  HCT 42.2  MCV 85.4    PLT 128   Basic Metabolic Panel: Recent Labs  Lab 05/19/20 1844  NA 145  K 4.4  CL 107  CO2 25  GLUCOSE 123*  BUN 11  CREATININE 0.90  CALCIUM 9.5   GFR: Estimated Creatinine Clearance: 84.9 mL/min (by C-G formula based on SCr of 0.9 mg/dL). Liver Function Tests: Recent Labs  Lab 05/19/20 1844  AST 37  ALT 35  ALKPHOS 342*  BILITOT 0.4  PROT 8.0  ALBUMIN 4.2   Recent Labs  Lab 05/19/20 1844  LIPASE 22   No results for input(s): AMMONIA in the last 168 hours. Coagulation Profile: No results for input(s): INR, PROTIME in the last 168 hours. Cardiac Enzymes: No results for input(s): CKTOTAL, CKMB, CKMBINDEX, TROPONINI in the last 168 hours. BNP (last 3 results) No results for input(s): PROBNP in the last 8760 hours. HbA1C: No results for input(s): HGBA1C in the last 72 hours. CBG: No results for input(s): GLUCAP in the last 168 hours. Lipid Profile: No results for input(s): CHOL, HDL, LDLCALC, TRIG, CHOLHDL, LDLDIRECT in the last 72 hours. Thyroid Function Tests: No results for input(s): TSH, T4TOTAL, FREET4, T3FREE, THYROIDAB in the last 72 hours. Anemia Panel: No results for input(s): VITAMINB12, FOLATE, FERRITIN, TIBC, IRON, RETICCTPCT in the last 72 hours. Urine analysis:    Component Value Date/Time   COLORURINE AMBER (A) 02/16/2020 0535   APPEARANCEUR CLOUDY (A) 02/16/2020 0535   LABSPEC 1.019 02/16/2020 0535   PHURINE 5.0 02/16/2020 0535   GLUCOSEU NEGATIVE 02/16/2020 0535   HGBUR SMALL (A) 02/16/2020 0535   BILIRUBINUR SMALL (A) 02/16/2020 0535   KETONESUR NEGATIVE 02/16/2020 0535   PROTEINUR 30 (A) 02/16/2020 0535  UROBILINOGEN 1.0 01/10/2015 2311   NITRITE NEGATIVE 02/16/2020 0535   LEUKOCYTESUR MODERATE (A) 02/16/2020 0535   Sepsis Labs: @LABRCNTIP (procalcitonin:4,lacticidven:4) )No results found for this or any previous visit (from the past 240 hour(s)).   Radiological Exams on Admission: CT Angio Chest PE W/Cm &/Or Wo Cm  Result  Date: 05/19/2020 CLINICAL DATA:  Complaining of weakness and chest discomfort EXAM: CT ANGIOGRAPHY CHEST WITH CONTRAST TECHNIQUE: Multidetector CT imaging of the chest was performed using the standard protocol during bolus administration of intravenous contrast. Multiplanar CT image reconstructions and MIPs were obtained to evaluate the vascular anatomy. CONTRAST:  137mL OMNIPAQUE IOHEXOL 350 MG/ML SOLN COMPARISON:  None. FINDINGS: Cardiovascular: There is a optimal opacification of the pulmonary arteries. There is no central,segmental, or subsegmental filling defects within the pulmonary arteries. There is mild cardiomegaly present. No pericardial effusion or thickening. No evidence right heart strain. There is normal three-vessel brachiocephalic anatomy without proximal stenosis. Scattered mild aortic atherosclerosis seen. Mediastinum/Nodes: No hilar, mediastinal, or axillary adenopathy. Thyroid gland, trachea, and esophagus demonstrate no significant findings. Lungs/Pleura: Streaky atelectasis seen at both lung bases. No pleural effusion or pneumothorax. No airspace consolidation. Upper Abdomen: No acute abnormalities present in the visualized portions of the upper abdomen. Musculoskeletal: No chest wall abnormality. No acute or significant osseous findings. Review of the MIP images confirms the above findings. IMPRESSION: No central, segmental, or subsegmental pulmonary embolism. Mild cardiomegaly. No other acute intrathoracic pathology to explain the patient's symptoms. Electronically Signed   By: Prudencio Pair M.D.   On: 05/19/2020 22:42   DG Chest Port 1 View  Result Date: 05/19/2020 CLINICAL DATA:  Chest pain, shortness of breath EXAM: PORTABLE CHEST 1 VIEW COMPARISON:  02/15/2020 FINDINGS: Heart is borderline in size. Lungs clear. No effusions or edema. No acute bony abnormality. IMPRESSION: No active disease. Electronically Signed   By: Rolm Baptise M.D.   On: 05/19/2020 19:14    EKG: Independently  reviewed.  It shows sinus tachycardia with a rate in the 130s.  New left bundle branch block which was not present on previous EKG.  Assessment/Plan Principal Problem:   SVT (supraventricular tachycardia) ([facility]) Active Problems:   MS (multiple sclerosis) ([facility])   Chronic diastolic CHF (congestive heart failure) (HCC)   Adult failure to thrive   SIRS (systemic inflammatory response syndrome) ([facility])   Dilated cardiomyopathy ([facility])   Essential hypertension   Chronic pain syndrome   Adrenal insufficiency (Addison's disease) (H[facility])   GERD (gastroesophageal reflux disease)   Insulin dependent diabetes mellitus type IA ([place])     #1 sinus tachycardia with new left bundle branch block: Patient is EKG discussed with cardiology by ER.  Supportive care only.  Patient not on any beta-blocker or rate control medications.  She is hypotensive.  We will give a liter of fluids but careful due to low EF.  Initial metoprolol 25 mg twice daily.  Admit the patient and monitor.  Last echocardiogram was in January of this year.  We will repeat one now.  #2 multiple sclerosis: Has been on treatment since her early 44s.  No change in treatment now.  #3 history of Addison's disease: Patient actually hypotensive.  Monitor closely  #4 chronic systolic dysfunction: Due to dilated cardiomyopathy.  Continue as above.  #5 diabetes: Continue with sliding scale insulin.  Also on long-term Lantus from home.  #6 SIRS: Patient meets the criteria.  No evidence of infection at this point.  Continue monitoring.  #7 adult failure to thrive: Patient is currently  DNR.  Continue.  #8 essential hypertension: Blood pressure markedly elevated.  Continue close monitoring and continue home regimen plus metoprolol.     DVT prophylaxis: Lovenox Code Status: DNR Family Communication: No family at bedside Disposition Plan: Home Consults called: None Admission status: Inpatient  Severity of Illness: The appropriate patient status  for this patient is INPATIENT. Inpatient status is judged to be reasonable and necessary in order to provide the required intensity of service to ensure the patient's safety. The patient's presenting symptoms, physical exam findings, and initial radiographic and laboratory data in the context of their chronic comorbidities is felt to place them at high risk for further clinical deterioration. Furthermore, it is not anticipated that the patient will be medically stable for discharge from the hospital within 2 midnights of admission. The following factors support the patient status of inpatient.   " The patient's presenting symptoms include weakness. " The worrisome physical exam findings include sinus tachycardia. " The initial radiographic and laboratory data are worrisome because of SIRS. " The chronic co-morbidities include multiple sclerosis.   * I certify that at the point of admission it is my clinical judgment that the patient will require inpatient hospital care spanning beyond 2 midnights from the point of admission due to high intensity of service, high risk for further deterioration and high frequency of surveillance required.Barbette Merino MD [facility] Pager [phone]  If 7PM-7AM, please contact night-coverage [email or web address] Password [email or web address]  05/19/2020, 11:01 PM

## 2020-05-19 NOTE — ED Notes (Signed)
Patient to radiology department for CTA

## 2020-05-20 ENCOUNTER — Inpatient Hospital Stay (HOSPITAL_COMMUNITY): Payer: Medicare (Managed Care)

## 2020-05-20 DIAGNOSIS — R9431 Abnormal electrocardiogram [ECG] [EKG]: Secondary | ICD-10-CM | POA: Diagnosis not present

## 2020-05-20 DIAGNOSIS — I10 Essential (primary) hypertension: Secondary | ICD-10-CM

## 2020-05-20 DIAGNOSIS — E271 Primary adrenocortical insufficiency: Secondary | ICD-10-CM

## 2020-05-20 DIAGNOSIS — I5032 Chronic diastolic (congestive) heart failure: Secondary | ICD-10-CM

## 2020-05-20 DIAGNOSIS — R079 Chest pain, unspecified: Secondary | ICD-10-CM

## 2020-05-20 LAB — CBC
HCT: 37.2 % (ref 36.0–46.0)
HCT: 37.3 % (ref 36.0–46.0)
Hemoglobin: 12.1 g/dL (ref 12.0–15.0)
Hemoglobin: 12.2 g/dL (ref 12.0–15.0)
MCH: 27.5 pg (ref 26.0–34.0)
MCH: 27.7 pg (ref 26.0–34.0)
MCHC: 32.5 g/dL (ref 30.0–36.0)
MCHC: 32.7 g/dL (ref 30.0–36.0)
MCV: 84.5 fL (ref 80.0–100.0)
MCV: 84.8 fL (ref 80.0–100.0)
Platelets: 300 10*3/uL (ref 150–400)
Platelets: 301 10*3/uL (ref 150–400)
RBC: 4.4 MIL/uL (ref 3.87–5.11)
RBC: 4.4 MIL/uL (ref 3.87–5.11)
RDW: 16.4 % — ABNORMAL HIGH (ref 11.5–15.5)
RDW: 16.4 % — ABNORMAL HIGH (ref 11.5–15.5)
WBC: 6.5 10*3/uL (ref 4.0–10.5)
WBC: 6.7 10*3/uL (ref 4.0–10.5)
nRBC: 0 % (ref 0.0–0.2)
nRBC: 0 % (ref 0.0–0.2)

## 2020-05-20 LAB — COMPREHENSIVE METABOLIC PANEL
ALT: 32 U/L (ref 0–44)
AST: 32 U/L (ref 15–41)
Albumin: 3.6 g/dL (ref 3.5–5.0)
Alkaline Phosphatase: 286 U/L — ABNORMAL HIGH (ref 38–126)
Anion gap: 12 (ref 5–15)
BUN: 10 mg/dL (ref 6–20)
CO2: 23 mmol/L (ref 22–32)
Calcium: 9 mg/dL (ref 8.9–10.3)
Chloride: 109 mmol/L (ref 98–111)
Creatinine, Ser: 0.83 mg/dL (ref 0.44–1.00)
GFR, Estimated: >60 mL/min (ref >60)
Glucose, Bld: 183 mg/dL — ABNORMAL HIGH (ref 70–99)
Potassium: 3.1 mmol/L — ABNORMAL LOW (ref 3.5–5.1)
Sodium: 144 mmol/L (ref 135–145)
Total Bilirubin: 0.5 mg/dL (ref 0.3–1.2)
Total Protein: 6.8 g/dL (ref 6.5–8.1)

## 2020-05-20 LAB — ECHOCARDIOGRAM COMPLETE
Height: 64 in
P 1/2 time: 377 msec
S' Lateral: 4.3 cm
Weight: 2927.71 oz

## 2020-05-20 LAB — GLUCOSE, CAPILLARY
Glucose-Capillary: 119 mg/dL — ABNORMAL HIGH (ref 70–99)
Glucose-Capillary: 147 mg/dL — ABNORMAL HIGH (ref 70–99)
Glucose-Capillary: 100 mg/dL — ABNORMAL HIGH (ref 70–99)
Glucose-Capillary: 100 mg/dL — ABNORMAL HIGH (ref 70–99)
Glucose-Capillary: 159 mg/dL — ABNORMAL HIGH (ref 70–99)

## 2020-05-20 LAB — CREATININE, SERUM
Creatinine, Ser: 0.83 mg/dL (ref 0.44–1.00)
GFR, Estimated: >60 mL/min (ref >60)

## 2020-05-20 LAB — MRSA PCR SCREENING: MRSA by PCR: NEGATIVE

## 2020-05-20 LAB — HIV ANTIBODY (ROUTINE TESTING W REFLEX): HIV Screen 4th Generation wRfx: NONREACTIVE

## 2020-05-20 LAB — RESP PANEL BY RT-PCR (FLU A&B, COVID) ARPGX2
Influenza A by PCR: NEGATIVE
Influenza B by PCR: NEGATIVE
SARS Coronavirus 2 by RT PCR: NEGATIVE

## 2020-05-20 LAB — HEMOGLOBIN A1C
Hgb A1c MFr Bld: 5.3 % (ref 4.8–5.6)
Mean Plasma Glucose: 105.41 mg/dL

## 2020-05-20 MED ORDER — LABETALOL HCL 5 MG/ML IV SOLN
5.0000 mg | INTRAVENOUS | Status: DC | PRN
  Filled 2020-05-20: qty 4

## 2020-05-20 MED ORDER — PANTOPRAZOLE SODIUM 40 MG PO TBEC
40.0000 mg | DELAYED_RELEASE_TABLET | Freq: Every day | ORAL | Status: DC
  Administered 2020-05-20 – 2020-05-21 (×2): 40 mg via ORAL
  Filled 2020-05-20 (×2): qty 1

## 2020-05-20 MED ORDER — INSULIN ASPART 100 UNIT/ML ~~LOC~~ SOLN
0.0000 [IU] | Freq: Three times a day (TID) | SUBCUTANEOUS | Status: DC
  Administered 2020-05-20: 2 [IU] via SUBCUTANEOUS

## 2020-05-20 MED ORDER — DIPHENHYDRAMINE HCL 2 % EX CREA: 1.0000 "application " | TOPICAL_CREAM | Freq: Two times a day (BID) | CUTANEOUS | Status: DC | PRN

## 2020-05-20 MED ORDER — SPIRONOLACTONE 25 MG PO TABS
25.0000 mg | ORAL_TABLET | Freq: Every day | ORAL | Status: DC
  Administered 2020-05-20 – 2020-05-21 (×2): 25 mg via ORAL
  Filled 2020-05-20 (×2): qty 1

## 2020-05-20 MED ORDER — ASPIRIN EC 325 MG PO TBEC
325.0000 mg | DELAYED_RELEASE_TABLET | Freq: Every day | ORAL | Status: DC
  Administered 2020-05-20 – 2020-05-21 (×2): 325 mg via ORAL
  Filled 2020-05-20 (×2): qty 1

## 2020-05-20 MED ORDER — INSULIN GLARGINE 100 UNIT/ML ~~LOC~~ SOLN
10.0000 [IU] | Freq: Every day | SUBCUTANEOUS | Status: DC
  Administered 2020-05-20: 10 [IU] via SUBCUTANEOUS
  Filled 2020-05-20 (×2): qty 0.1

## 2020-05-20 MED ORDER — METOPROLOL SUCCINATE ER 50 MG PO TB24: 150.0000 mg | ORAL_TABLET | Freq: Every day | ORAL | Status: DC

## 2020-05-20 MED ORDER — ASCORBIC ACID 500 MG PO TABS
500.0000 mg | ORAL_TABLET | Freq: Every day | ORAL | Status: DC
  Administered 2020-05-20 – 2020-05-21 (×2): 500 mg via ORAL
  Filled 2020-05-20 (×2): qty 1

## 2020-05-20 MED ORDER — FAMOTIDINE 20 MG PO TABS
20.0000 mg | ORAL_TABLET | Freq: Two times a day (BID) | ORAL | Status: DC
  Administered 2020-05-20 – 2020-05-21 (×4): 20 mg via ORAL
  Filled 2020-05-20 (×4): qty 1

## 2020-05-20 MED ORDER — GABAPENTIN 400 MG PO CAPS
800.0000 mg | ORAL_CAPSULE | Freq: Three times a day (TID) | ORAL | Status: DC
  Administered 2020-05-20 – 2020-05-21 (×5): 800 mg via ORAL
  Filled 2020-05-20 (×5): qty 2

## 2020-05-20 MED ORDER — DIPHENHYDRAMINE HCL 25 MG PO CAPS: 25.0000 mg | ORAL_CAPSULE | Freq: Four times a day (QID) | ORAL | Status: DC | PRN

## 2020-05-20 MED ORDER — ALBUTEROL SULFATE HFA 108 (90 BASE) MCG/ACT IN AERS: 2.0000 | INHALATION_SPRAY | Freq: Four times a day (QID) | RESPIRATORY_TRACT | Status: DC | PRN

## 2020-05-20 MED ORDER — ENOXAPARIN SODIUM 40 MG/0.4ML ~~LOC~~ SOLN
40.0000 mg | SUBCUTANEOUS | Status: DC
  Administered 2020-05-20 – 2020-05-21 (×2): 40 mg via SUBCUTANEOUS
  Filled 2020-05-20 (×2): qty 0.4

## 2020-05-20 MED ORDER — SIMETHICONE 80 MG PO CHEW: 80.0000 mg | CHEWABLE_TABLET | Freq: Four times a day (QID) | ORAL | Status: DC | PRN

## 2020-05-20 MED ORDER — ADULT MULTIVITAMIN W/MINERALS CH
1.0000 | ORAL_TABLET | Freq: Every day | ORAL | Status: DC
  Administered 2020-05-20 – 2020-05-21 (×2): 1 via ORAL
  Filled 2020-05-20 (×2): qty 1

## 2020-05-20 MED ORDER — POTASSIUM CHLORIDE CRYS ER 20 MEQ PO TBCR
20.0000 meq | EXTENDED_RELEASE_TABLET | Freq: Every day | ORAL | Status: DC
  Administered 2020-05-20 – 2020-05-21 (×2): 20 meq via ORAL
  Filled 2020-05-20 (×2): qty 1

## 2020-05-20 MED ORDER — BUPROPION HCL ER (SR) 150 MG PO TB12
150.0000 mg | ORAL_TABLET | Freq: Every day | ORAL | Status: DC
  Administered 2020-05-20 – 2020-05-21 (×2): 150 mg via ORAL
  Filled 2020-05-20 (×2): qty 1

## 2020-05-20 MED ORDER — ACETAMINOPHEN 325 MG PO TABS: 650.0000 mg | ORAL_TABLET | Freq: Three times a day (TID) | ORAL | Status: DC | PRN

## 2020-05-20 MED ORDER — POTASSIUM CHLORIDE CRYS ER 20 MEQ PO TBCR
60.0000 meq | EXTENDED_RELEASE_TABLET | Freq: Once | ORAL | Status: AC
  Administered 2020-05-20: 60 meq via ORAL
  Filled 2020-05-20: qty 3

## 2020-05-20 MED ORDER — DULOXETINE HCL 60 MG PO CPEP
60.0000 mg | ORAL_CAPSULE | Freq: Every day | ORAL | Status: DC
  Administered 2020-05-20 – 2020-05-21 (×2): 60 mg via ORAL
  Filled 2020-05-20 (×2): qty 1

## 2020-05-20 MED ORDER — ACETAMINOPHEN 325 MG PO TABS: 650.0000 mg | ORAL_TABLET | Freq: Four times a day (QID) | ORAL | Status: DC | PRN

## 2020-05-20 MED ORDER — LATANOPROST 0.005 % OP SOLN
1.0000 [drp] | Freq: Every day | OPHTHALMIC | Status: DC
  Administered 2020-05-20: 1 [drp] via OPHTHALMIC
  Filled 2020-05-20: qty 2.5

## 2020-05-20 MED ORDER — TAMSULOSIN HCL 0.4 MG PO CAPS
0.4000 mg | ORAL_CAPSULE | Freq: Every day | ORAL | Status: DC
  Administered 2020-05-20 – 2020-05-21 (×2): 0.4 mg via ORAL
  Filled 2020-05-20 (×2): qty 1

## 2020-05-20 MED ORDER — ONDANSETRON HCL 4 MG PO TABS: 4.0000 mg | ORAL_TABLET | Freq: Four times a day (QID) | ORAL | Status: DC | PRN

## 2020-05-20 MED ORDER — BACLOFEN 5 MG HALF TABLET
5.0000 mg | ORAL_TABLET | Freq: Four times a day (QID) | ORAL | Status: DC
  Administered 2020-05-20: 5 mg via ORAL
  Filled 2020-05-20 (×3): qty 1

## 2020-05-20 MED ORDER — TRAMADOL HCL 50 MG PO TABS
100.0000 mg | ORAL_TABLET | Freq: Four times a day (QID) | ORAL | Status: DC | PRN
  Administered 2020-05-20: 100 mg via ORAL
  Filled 2020-05-20: qty 2

## 2020-05-20 MED ORDER — SODIUM CHLORIDE 0.9 % IV SOLN: INTRAVENOUS | Status: DC

## 2020-05-20 MED ORDER — FLUTICASONE PROPIONATE 50 MCG/ACT NA SUSP
2.0000 | Freq: Every evening | NASAL | Status: DC
  Administered 2020-05-20: 2 via NASAL
  Filled 2020-05-20 (×2): qty 16

## 2020-05-20 MED ORDER — ALBUTEROL SULFATE (2.5 MG/3ML) 0.083% IN NEBU: 2.5000 mg | INHALATION_SOLUTION | Freq: Four times a day (QID) | RESPIRATORY_TRACT | Status: DC | PRN

## 2020-05-20 MED ORDER — AMANTADINE HCL 100 MG PO CAPS
100.0000 mg | ORAL_CAPSULE | Freq: Every day | ORAL | Status: DC
  Administered 2020-05-20 – 2020-05-21 (×2): 100 mg via ORAL
  Filled 2020-05-20 (×2): qty 1

## 2020-05-20 MED ORDER — HYDROCORTISONE 1 % EX CREA: 1.0000 "application " | TOPICAL_CREAM | Freq: Two times a day (BID) | CUTANEOUS | Status: DC | PRN

## 2020-05-20 MED ORDER — SENNOSIDES-DOCUSATE SODIUM 8.6-50 MG PO TABS
2.0000 | ORAL_TABLET | Freq: Two times a day (BID) | ORAL | Status: DC
  Administered 2020-05-20 – 2020-05-21 (×3): 2 via ORAL
  Filled 2020-05-20 (×3): qty 2

## 2020-05-20 MED ORDER — INSULIN ASPART 100 UNIT/ML ~~LOC~~ SOLN: 0.0000 [IU] | Freq: Every day | SUBCUTANEOUS | Status: DC

## 2020-05-20 MED ORDER — BACLOFEN 10 MG PO TABS
5.0000 mg | ORAL_TABLET | Freq: Four times a day (QID) | ORAL | Status: DC
  Administered 2020-05-20 – 2020-05-21 (×5): 5 mg via ORAL
  Filled 2020-05-20 (×6): qty 1

## 2020-05-20 MED ORDER — ENSURE ENLIVE PO LIQD
237.0000 mL | Freq: Two times a day (BID) | ORAL | Status: DC
  Administered 2020-05-20 – 2020-05-21 (×4): 237 mL via ORAL

## 2020-05-20 MED ORDER — ONDANSETRON HCL 4 MG/2ML IJ SOLN: 4.0000 mg | Freq: Four times a day (QID) | INTRAMUSCULAR | Status: DC | PRN

## 2020-05-20 MED ORDER — FERROUS SULFATE 325 (65 FE) MG PO TABS
325.0000 mg | ORAL_TABLET | Freq: Two times a day (BID) | ORAL | Status: DC
  Administered 2020-05-20 – 2020-05-21 (×3): 325 mg via ORAL
  Filled 2020-05-20 (×3): qty 1

## 2020-05-20 MED ORDER — MELATONIN 5 MG PO TABS
10.0000 mg | ORAL_TABLET | Freq: Every day | ORAL | Status: DC
  Administered 2020-05-20: 10 mg via ORAL
  Filled 2020-05-20: qty 2

## 2020-05-20 MED ORDER — RISAQUAD PO CAPS
1.0000 | ORAL_CAPSULE | Freq: Every day | ORAL | Status: DC
  Administered 2020-05-20 – 2020-05-21 (×2): 1 via ORAL
  Filled 2020-05-20 (×2): qty 1

## 2020-05-20 MED ORDER — LUBIPROSTONE 24 MCG PO CAPS
24.0000 ug | ORAL_CAPSULE | Freq: Two times a day (BID) | ORAL | Status: DC
  Administered 2020-05-20 – 2020-05-21 (×3): 24 ug via ORAL
  Filled 2020-05-20 (×5): qty 1

## 2020-05-20 MED ORDER — CRANBERRY 400 MG PO TABS: 400.0000 mg | ORAL_TABLET | Freq: Every day | ORAL | Status: DC

## 2020-05-20 MED ORDER — ACETAMINOPHEN 650 MG RE SUPP: 650.0000 mg | Freq: Four times a day (QID) | RECTAL | Status: DC | PRN

## 2020-05-20 MED ORDER — MAGNESIUM CITRATE PO SOLN
1.0000 | Freq: Once | ORAL | Status: AC
  Administered 2020-05-20: 1 via ORAL
  Filled 2020-05-20: qty 296

## 2020-05-20 MED ORDER — METOPROLOL TARTRATE 25 MG PO TABS
25.0000 mg | ORAL_TABLET | Freq: Two times a day (BID) | ORAL | Status: DC
  Administered 2020-05-20 – 2020-05-21 (×4): 25 mg via ORAL
  Filled 2020-05-20 (×4): qty 1

## 2020-05-20 NOTE — Progress Notes (Deleted)
   05/20/20 0043  Assess: MEWS Score  Temp 99.8 F (37.7 C)  BP (!) 160/105  Pulse Rate (!) 123  Resp (!) 22  SpO2 100 %  O2 Device Room Air  Assess: MEWS Score  MEWS Temp 0  MEWS Systolic 0  MEWS Pulse 2  MEWS RR 1  MEWS LOC 0  MEWS Score 3  MEWS Score Color Yellow  Assess: if the MEWS score is Yellow or Red  Were vital signs taken at a resting state? Yes  Focused Assessment No change from prior assessment  Early Detection of Sepsis Score *See Row Information* Low  MEWS guidelines implemented *See Row Information* No, previously yellow, continue vital signs every 4 hours  Notify: Charge Nurse/RN  Name of Charge Nurse/RN Notified Chloe Addison

## 2020-05-20 NOTE — Progress Notes (Signed)
PHARMACIST - PHYSICIAN ORDER COMMUNICATION  CONCERNING: P&T Medication Policy on Herbal Medications  DESCRIPTION:  This patient's order for:  Cranberry tabs  has been noted.  This product(s) is classified as an "herbal" or natural product. Due to a lack of definitive safety studies or FDA approval, nonstandard manufacturing practices, plus the potential risk of unknown drug-drug interactions while on inpatient medications, the Pharmacy and Therapeutics Committee does not permit the use of "herbal" or natural products of this type within [facility].   ACTION TAKEN: The pharmacy department is unable to verify this order at this time and your patient has been informed of this safety policy. Please reevaluate patient's clinical condition at discharge and address if the herbal or natural product(s) should be resumed at that time.   Dolly Rias RPh 05/20/2020, 2:24 AM

## 2020-05-20 NOTE — Progress Notes (Signed)
  Echocardiogram 2D Echocardiogram has been performed.  Jennette Dubin 05/20/2020, 1:45 PM

## 2020-05-20 NOTE — Plan of Care (Signed)

## 2020-05-20 NOTE — NC FL2 (Signed)
[place] LEVEL OF CARE SCREENING TOOL     IDENTIFICATION  Patient Name: Shannon Garrett Birthdate: [birth year] Sex: female Admission Date (Current Location): 05/19/2020  [place] and [place] Number:  Kathleen Argue [id number] [place] and Address:  [facility],  [address], [place]      Provider Number: [id number]  Attending Physician Name and Address:  Donne Hazel, MD  Relative Name and Phone Number:  Venola Castello sister [phone]    Current Level of Care: Hospital Recommended Level of Care: Nursing Facility Prior Approval Number:    Date Approved/Denied:   PASRR Number:    Discharge Plan: Other (Comment) (LTC)    Current Diagnoses: Patient Active Problem List   Diagnosis Date Noted  . SVT (supraventricular tachycardia) ([place]) 05/19/2020  . Cholangitis from CBD obstruction s/p ERCP 02/15/2020 02/16/2020  . Choledocholithiasis s/p ERCP 02/15/2020 02/16/2020  . Cholecystitis 02/12/2020  . Abnormal nuclear stress test   . Preoperative cardiovascular examination   . Acute calculous cholecystitis 07/19/2019  . Insulin dependent diabetes mellitus type IA ([place]) 07/19/2019  . History of Clostridioides difficile colitis 07/19/2019  . Multiple drug resistant organism (MDRO) Klebsiella pneumonia UTI culture positive 07/19/2019  . Enteritis due to Clostridium difficile 06/24/2019  . COVID-19 virus infection 06/24/2019  . Palliative care by specialist   . DNR (do not resuscitate)   . Sepsis ([place]) 05/10/2019  . History of COVID-19 Nov2020 05/10/2019  . Dysesthesia 08/26/2018  . Dyslipidemia 06/13/2017  . Elevated alkaline phosphatase level 06/13/2017  . Acute frontal sinusitis 05/24/2017  . Influenza 04/19/2017  . Glaucoma 04/06/2017  . Allergic rhinitis due to allergen 03/07/2017  . Vaginal candidiasis 03/01/2017  . UTI (urinary tract infection) 01/02/2017  . High risk medication use 01/25/2016  . Neuropathy 11/13/2015  . GERD  (gastroesophageal reflux disease) 11/13/2015  . Dysphagia as late effect of stroke 11/08/2015  . Adrenal insufficiency (Addison's disease) ([place]) 08/12/2015  . Vitamin D deficiency 06/18/2015  . Hypokalemia 06/03/2015  . Chronic anemia 05/30/2015  . Chronic pain syndrome 03/03/2015  . Essential hypertension   . SIRS (systemic inflammatory response syndrome) ([place]) 01/11/2015  . Dilated cardiomyopathy ([place])   . Chronic constipation 11/26/2014  . Depression 11/26/2014  . Adult failure to thrive 07/31/2013  . Insomnia 06/03/2013  . Neurogenic bladder 06/03/2013  . Paraplegia ([place]) 09/05/2012  . Chronic diastolic CHF (congestive heart failure) ([place]) 05/22/2012  . Obesity 11/30/2008  . MS (multiple sclerosis) ([place]) 11/30/2008  . Hypertensive heart disease with CHF (congestive heart failure) ([place]) 11/30/2008    Orientation RESPIRATION BLADDER Height & Weight     Self, Situation, Time  Normal Incontinent Weight: 83 kg Height:  5\' 4"  (162.6 cm)  BEHAVIORAL SYMPTOMS/MOOD NEUROLOGICAL BOWEL NUTRITION STATUS      Incontinent Diet  AMBULATORY STATUS COMMUNICATION OF NEEDS Skin   Total Care (w/c bound;Hoyer lift) Verbally PU Stage and Appropriate Care (Sacral Decubitus)                       Personal Care Assistance Level of Assistance  Dressing, Bathing, Feeding, Total care Bathing Assistance: Maximum assistance Feeding assistance: Independent Dressing Assistance: Maximum assistance Total Care Assistance: Maximum assistance   Functional Limitations Info  Sight, Hearing, Speech Sight Info: Adequate Hearing Info: Adequate Speech Info: Adequate    SPECIAL CARE FACTORS FREQUENCY                       Contractures Contractures  Info: Present    Additional Factors Info  Code Status, Allergies, Psychotropic, Insulin Sliding Scale Code Status Info:  (DNR) Allergies Info:  (Sulfonamides Derivatives;Penicillins) Psychotropic Info:  (See MAR) Insulin Sliding Scale Info:   (SSI)       Current Medications (05/20/2020):  This is the current hospital active medication list Current Facility-Administered Medications  Medication Dose Route Frequency Provider Last Rate Last Admin  . 0.9 %  sodium chloride infusion   Intravenous Continuous Elwyn Reach, MD 150 mL/hr at 05/20/20 0505 New Bag at 05/20/20 0505  . acetaminophen (TYLENOL) tablet 650 mg  650 mg Oral Q6H PRN Elwyn Reach, MD       Or  . acetaminophen (TYLENOL) suppository 650 mg  650 mg Rectal Q6H PRN Elwyn Reach, MD      . acetaminophen (TYLENOL) tablet 650 mg  650 mg Oral Q8H PRN Gala Romney L, MD      . acidophilus (RISAQUAD) capsule 1 capsule  1 capsule Oral Daily Elwyn Reach, MD   1 capsule at 05/20/20 0924  . albuterol (PROVENTIL) (2.5 MG/3ML) 0.083% nebulizer solution 2.5 mg  2.5 mg Nebulization Q6H PRN Gala Romney L, MD      . amantadine (SYMMETREL) capsule 100 mg  100 mg Oral Daily Gala Romney L, MD   100 mg at 05/20/20 0959  . ascorbic acid (VITAMIN C) tablet 500 mg  500 mg Oral Daily Elwyn Reach, MD   500 mg at 05/20/20 0924  . aspirin EC tablet 325 mg  325 mg Oral Daily Elwyn Reach, MD   325 mg at 05/20/20 0924  . baclofen (LIORESAL) tablet 5 mg  5 mg Oral QID Donne Hazel, MD   5 mg at 05/20/20 1820  . buPROPion (WELLBUTRIN SR) 12 hr tablet 150 mg  150 mg Oral Daily Elwyn Reach, MD   150 mg at 05/20/20 0924  . diphenhydrAMINE (BENADRYL) 2 % cream 1 application  1 application Topical A19F PRN Gala Romney L, MD      . diphenhydrAMINE (BENADRYL) capsule 25 mg  25 mg Oral Q6H PRN Jonelle Sidle, Mohammad L, MD      . DULoxetine (CYMBALTA) DR capsule 60 mg  60 mg Oral Daily Elwyn Reach, MD   60 mg at 05/20/20 0924  . enoxaparin (LOVENOX) injection 40 mg  40 mg Subcutaneous Q24H Gala Romney L, MD   40 mg at 05/20/20 0522  . famotidine (PEPCID) tablet 20 mg  20 mg Oral BID Elwyn Reach, MD   20 mg at 05/20/20 0924  . feeding supplement  (ENSURE ENLIVE / ENSURE PLUS) liquid 237 mL  237 mL Oral BID BM Garba, Mohammad L, MD   237 mL at 05/20/20 1515  . ferrous sulfate tablet 325 mg  325 mg Oral BID WC Elwyn Reach, MD   325 mg at 05/20/20 1821  . fluticasone (FLONASE) 50 MCG/ACT nasal spray 2 spray  2 spray Each Nare QPM Garba, Mohammad L, MD      . gabapentin (NEURONTIN) capsule 800 mg  800 mg Oral TID Gala Romney L, MD   800 mg at 05/20/20 1516  . hydrocortisone cream 1 % 1 application  1 application Topical X90W PRN Garba, Mohammad L, MD      . insulin aspart (novoLOG) injection 0-15 Units  0-15 Units Subcutaneous TID WC Elwyn Reach, MD   2 Units at 05/20/20 0900  . insulin aspart (novoLOG) injection 0-5 Units  0-5 Units Subcutaneous QHS Garba, Mohammad L, MD      . insulin glargine (LANTUS) injection 10 Units  10 Units Subcutaneous QHS Jonelle Sidle, Mohammad L, MD      . labetalol (NORMODYNE) injection 5 mg  5 mg Intravenous Q2H PRN Donne Hazel, MD      . latanoprost (XALATAN) 0.005 % ophthalmic solution 1 drop  1 drop Both Eyes QHS Garba, Mohammad L, MD      . lubiprostone (AMITIZA) capsule 24 mcg  24 mcg Oral BID WC Elwyn Reach, MD   24 mcg at 05/20/20 1821  . melatonin tablet 10 mg  10 mg Oral QHS Garba, Mohammad L, MD      . metoprolol tartrate (LOPRESSOR) tablet 25 mg  25 mg Oral BID Elwyn Reach, MD   25 mg at 05/20/20 0924  . multivitamin with minerals tablet 1 tablet  1 tablet Oral Daily Elwyn Reach, MD   1 tablet at 05/20/20 0924  . ondansetron (ZOFRAN) tablet 4 mg  4 mg Oral Q6H PRN Elwyn Reach, MD       Or  . ondansetron (ZOFRAN) injection 4 mg  4 mg Intravenous Q6H PRN Garba, Mohammad L, MD      . ondansetron (ZOFRAN) tablet 4 mg  4 mg Oral Q6H PRN Gala Romney L, MD      . pantoprazole (PROTONIX) EC tablet 40 mg  40 mg Oral Daily Elwyn Reach, MD   40 mg at 05/20/20 0924  . potassium chloride SA (KLOR-CON) CR tablet 20 mEq  20 mEq Oral Daily Elwyn Reach, MD   20 mEq  at 05/20/20 0924  . senna-docusate (Senokot-S) tablet 2 tablet  2 tablet Oral BID Elwyn Reach, MD   2 tablet at 05/20/20 0923  . simethicone (MYLICON) chewable tablet 80 mg  80 mg Oral QID PRN Elwyn Reach, MD      . spironolactone (ALDACTONE) tablet 25 mg  25 mg Oral Daily Elwyn Reach, MD   25 mg at 05/20/20 0924  . tamsulosin (FLOMAX) capsule 0.4 mg  0.4 mg Oral Daily Gala Romney L, MD   0.4 mg at 05/20/20 0924  . traMADol (ULTRAM) tablet 100 mg  100 mg Oral Q6H PRN Elwyn Reach, MD   100 mg at 05/20/20 1821     Discharge Medications: Please see discharge summary for a list of discharge medications.  Relevant Imaging Results:  Relevant Lab Results:   Additional Information SS #[id number]  Dessa Phi, RN

## 2020-05-20 NOTE — Progress Notes (Addendum)
PROGRESS NOTE    Shannon Garrett  DUK:[id number] DOB: 1960-08-23 DOA: 05/19/2020 PCP: Gildardo Cranker, DO    Brief Narrative:   59 y.o. female with medical history significant of coronary artery disease, history of cardiomyopathy with EF of 20 to 25%, history of Covid pneumonia last year, chronic systolic dysfunction, history of sacral decubitus ulcer, diabetes, hypertension, multiple sclerosis and recent cardiac catheterization in April of this year with clean coronaries who is DNR.  Patient resides in a skilled facility and was brought in today with weakness and chest discomfort.  Her enzymes and EKG did not show evidence of acute MI.  Evaluated for possible decompensation of her cardiac status.  Cardiology consulted by ER and suggest noncardiac issue due to recent clean coronaries.  Patient has remained persistently tachycardic heart rates in the 130s.  She appears to be having supraventricular tachycardia.  CT angiogram shows no evidence of PE  Assessment & Plan:   Principal Problem:   SVT (supraventricular tachycardia) (HCC) Active Problems:   MS (multiple sclerosis) (HCC)   Chronic diastolic CHF (congestive heart failure) (HCC)   Adult failure to thrive   SIRS (systemic inflammatory response syndrome) (HCC)   Dilated cardiomyopathy (HCC)   Essential hypertension   Chronic pain syndrome   Adrenal insufficiency (Addison's disease) (HCC)   GERD (gastroesophageal reflux disease)   Insulin dependent diabetes mellitus type IA ([place])   1. Sinus tach 1. Presenting HR in the 120-130's 2. On further questioning, pt admits to not taking her meds recently secondary to feeling "tired of taking" her multiple meds 3. Beta blocker resumed with resumption of rate control 4. Continued on tele 5. Repeat bmet in AM 2. MS 1. Cont current regimen 3. Hx Addison's disease 1. Seems to be stable at this time 4. Chronic Systolic CHF 1. Appears euvolemic this AM 2. Repeat 2d echo performed and  reviewed. Findings of 35-40% with global hypokinesis, similar to prior 2d echo from 1/21  5. DM2 1. Glucose trends stable and controlled 2. Cont on home lantus with SSI 6. Constipation 1. Reports no BM in over one week, if not longer 2. Small insignificant result noted with Mg citrate 3. Given trial of soap suds enema 7. HTN 1. BP stable at present 2. Cont home med as tolerated 8. Medication noncompliance 1. Pt had admitted to recently not taking her medications since she has become "tired" of taking so many meds 2. Pt is aware of importance of her medications and states she will be more adherent in the future 9. Hypokalemia 1. Will replace 2. Repeat bmet in AM  DVT prophylaxis: Lovenox subq Code Status: DNR Family Communication: Pt in room, family not at bedside  Status is: Inpatient  Remains inpatient appropriate because:Unsafe d/c plan and Inpatient level of care appropriate due to severity of illness   Dispo: The patient is from: SNF              Anticipated d/c is to: SNF              Anticipated d/c date is: 1 day              Patient currently is not medically stable to d/c.   Consultants:     Procedures:     Antimicrobials: Anti-infectives (From admission, onward)   None       Subjective: Reports feeling constipated. No BM in over one week  Objective: Vitals:   05/20/20 0445 05/20/20 0728 05/20/20 0900 05/20/20 1207  BP: Marland Kitchen)  155/96 (!) 161/111  138/87  Pulse: (!) 128 (!) 115  97  Resp: (!) 24 (!) 24 (!) 24 (!) 24  Temp: 99.2 F (37.3 C) 98.1 F (36.7 C)  (!) 97.5 F (36.4 C)  TempSrc: Oral Oral  Oral  SpO2: 100% 100%  100%  Weight:      Height:        Intake/Output Summary (Last 24 hours) at 05/20/2020 1719 Last data filed at 05/20/2020 0641 Gross per 24 hour  Intake 439.73 ml  Output --  Net 439.73 ml   Filed Weights   05/19/20 1821 05/20/20 0105  Weight: 113 kg 83 kg    Examination:  General exam: Appears calm and comfortable   Respiratory system: Clear to auscultation. Respiratory effort normal. Cardiovascular system: S1 & S2 heard, Regular Gastrointestinal system: Abdomen distended, decreased BS Central nervous system: Alert and oriented. No focal neurological deficits. Extremities: Symmetric 5 x 5 power. Skin: No rashes, lesions Psychiatry: Judgement and insight appear normal. Mood & affect appropriate.   Data Reviewed: I have personally reviewed following labs and imaging studies  CBC: Recent Labs  Lab 05/19/20 1844 05/20/20 0628  WBC 7.1 6.5  6.7  NEUTROABS 3.5  --   HGB 13.9 12.1  12.2  HCT 42.2 37.2  37.3  MCV 85.4 84.5  84.8  PLT 369 301  144   Basic Metabolic Panel: Recent Labs  Lab 05/19/20 1844 05/20/20 0628  NA 145 144  K 4.4 3.1*  CL 107 109  CO2 25 23  GLUCOSE 123* 183*  BUN 11 10  CREATININE 0.90 0.83  0.83  CALCIUM 9.5 9.0   GFR: Estimated Creatinine Clearance: 77.9 mL/min (by C-G formula based on SCr of 0.83 mg/dL). Liver Function Tests: Recent Labs  Lab 05/19/20 1844 05/20/20 0628  AST 37 32  ALT 35 32  ALKPHOS 342* 286*  BILITOT 0.4 0.5  PROT 8.0 6.8  ALBUMIN 4.2 3.6   Recent Labs  Lab 05/19/20 1844  LIPASE 22   No results for input(s): AMMONIA in the last 168 hours. Coagulation Profile: No results for input(s): INR, PROTIME in the last 168 hours. Cardiac Enzymes: No results for input(s): CKTOTAL, CKMB, CKMBINDEX, TROPONINI in the last 168 hours. BNP (last 3 results) No results for input(s): PROBNP in the last 8760 hours. HbA1C: Recent Labs    05/20/20 0628  HGBA1C 5.3   CBG: Recent Labs  Lab 05/20/20 0131 05/20/20 0724 05/20/20 1203 05/20/20 1601  GLUCAP 119* 147* 100* 100*   Lipid Profile: No results for input(s): CHOL, HDL, LDLCALC, TRIG, CHOLHDL, LDLDIRECT in the last 72 hours. Thyroid Function Tests: No results for input(s): TSH, T4TOTAL, FREET4, T3FREE, THYROIDAB in the last 72 hours. Anemia Panel: No results for input(s):  VITAMINB12, FOLATE, FERRITIN, TIBC, IRON, RETICCTPCT in the last 72 hours. Sepsis Labs: No results for input(s): PROCALCITON, LATICACIDVEN in the last 168 hours.  Recent Results (from the past 240 hour(s))  Resp Panel by RT-PCR (Flu A&B, Covid) Nasopharyngeal Swab     Status: None   Collection Time: 05/19/20 10:50 PM   Specimen: Nasopharyngeal Swab; Nasopharyngeal(NP) swabs in vial transport medium  Result Value Ref Range Status   SARS Coronavirus 2 by RT PCR NEGATIVE NEGATIVE Final    Comment: (NOTE) SARS-CoV-2 target nucleic acids are NOT DETECTED.  The SARS-CoV-2 RNA is generally detectable in upper respiratory specimens during the acute phase of infection. The lowest concentration of SARS-CoV-2 viral copies this assay can detect is 138 copies/mL. A  negative result does not preclude SARS-Cov-2 infection and should not be used as the sole basis for treatment or other patient management decisions. A negative result may occur with  improper specimen collection/handling, submission of specimen other than nasopharyngeal swab, presence of viral mutation(s) within the areas targeted by this assay, and inadequate number of viral copies(<138 copies/mL). A negative result must be combined with clinical observations, patient history, and epidemiological information. The expected result is Negative.  Fact Sheet for Patients:  EntrepreneurPulse.com.au  Fact Sheet for Healthcare Providers:  IncredibleEmployment.be  This test is no t yet approved or cleared by the [place] FDA and  has been authorized for detection and/or diagnosis of SARS-CoV-2 by FDA under an Emergency Use Authorization (EUA). This EUA will remain  in effect (meaning this test can be used) for the duration of the COVID-19 declaration under Section 564(b)(1) of the Act, 21 U.S.C.section 360bbb-3(b)(1), unless the authorization is terminated  or revoked sooner.       Influenza A  by PCR NEGATIVE NEGATIVE Final   Influenza B by PCR NEGATIVE NEGATIVE Final    Comment: (NOTE) The Xpert Xpress SARS-CoV-2/FLU/RSV plus assay is intended as an aid in the diagnosis of influenza from Nasopharyngeal swab specimens and should not be used as a sole basis for treatment. Nasal washings and aspirates are unacceptable for Xpert Xpress SARS-CoV-2/FLU/RSV testing.  Fact Sheet for Patients: EntrepreneurPulse.com.au  Fact Sheet for Healthcare Providers: IncredibleEmployment.be  This test is not yet approved or cleared by the [place] FDA and has been authorized for detection and/or diagnosis of SARS-CoV-2 by FDA under an Emergency Use Authorization (EUA). This EUA will remain in effect (meaning this test can be used) for the duration of the COVID-19 declaration under Section 564(b)(1) of the Act, 21 U.S.C. section 360bbb-3(b)(1), unless the authorization is terminated or revoked.  Performed at [facility], [place] [address] [place], [place] [address]   MRSA PCR Screening     Status: None   Collection Time: 05/20/20  2:08 AM   Specimen: Nasal Mucosa; Nasopharyngeal  Result Value Ref Range Status   MRSA by PCR NEGATIVE NEGATIVE Final    Comment:        The GeneXpert MRSA Assay (FDA approved for NASAL specimens only), is one component of a comprehensive MRSA colonization surveillance program. It is not intended to diagnose MRSA infection nor to guide or monitor treatment for MRSA infections. Performed at [facility], [place] [address] [place], [place] [address]      Radiology Studies: CT Angio Chest PE W/Cm &/Or Wo Cm  Result Date: 05/19/2020 CLINICAL DATA:  Complaining of weakness and chest discomfort EXAM: CT ANGIOGRAPHY CHEST WITH CONTRAST TECHNIQUE: Multidetector CT imaging of the chest was performed using the standard protocol during bolus administration of intravenous contrast.  Multiplanar CT image reconstructions and MIPs were obtained to evaluate the vascular anatomy. CONTRAST:  172mL OMNIPAQUE IOHEXOL 350 MG/ML SOLN COMPARISON:  None. FINDINGS: Cardiovascular: There is a optimal opacification of the pulmonary arteries. There is no central,segmental, or subsegmental filling defects within the pulmonary arteries. There is mild cardiomegaly present. No pericardial effusion or thickening. No evidence right heart strain. There is normal three-vessel brachiocephalic anatomy without proximal stenosis. Scattered mild aortic atherosclerosis seen. Mediastinum/Nodes: No hilar, mediastinal, or axillary adenopathy. Thyroid gland, trachea, and esophagus demonstrate no significant findings. Lungs/Pleura: Streaky atelectasis seen at both lung bases. No pleural effusion or pneumothorax. No airspace consolidation. Upper Abdomen: No acute abnormalities present in the visualized  portions of the upper abdomen. Musculoskeletal: No chest wall abnormality. No acute or significant osseous findings. Review of the MIP images confirms the above findings. IMPRESSION: No central, segmental, or subsegmental pulmonary embolism. Mild cardiomegaly. No other acute intrathoracic pathology to explain the patient's symptoms. Electronically Signed   By: Prudencio Pair M.D.   On: 05/19/2020 22:42   DG Chest Port 1 View  Result Date: 05/19/2020 CLINICAL DATA:  Chest pain, shortness of breath EXAM: PORTABLE CHEST 1 VIEW COMPARISON:  02/15/2020 FINDINGS: Heart is borderline in size. Lungs clear. No effusions or edema. No acute bony abnormality. IMPRESSION: No active disease. Electronically Signed   By: Rolm Baptise M.D.   On: 05/19/2020 19:14   ECHOCARDIOGRAM COMPLETE  Result Date: 05/20/2020    ECHOCARDIOGRAM REPORT   Patient Name:   DAYANI WINBUSH Date of Exam: 05/20/2020 Medical Rec #:  [id number]     Height:       64.0 in Accession #:    [id number]    Weight:       183.0 lb Date of Birth:  [birth year]     BSA:           1.884 m Patient Age:    16 years      BP:           138/87 mmHg Patient Gender: F             HR:           100 bpm. Exam Location:  Inpatient Procedure: 2D Echo Indications:    Abnormal ECG R94.31  History:        Patient has prior history of Echocardiogram examinations, most                 recent 07/19/2019.  Sonographer:    Mikki Santee RDCS (AE) Referring Phys: [place]  1. Left ventricular ejection fraction, by estimation, is 35 to 40%. The left ventricle has moderately decreased function. The left ventricle demonstrates global hypokinesis. Indeterminate diastolic filling due to E-A fusion.  2. Right ventricular systolic function is normal. The right ventricular size is normal. Tricuspid regurgitation signal is inadequate for assessing PA pressure.  3. The mitral valve is grossly normal. Trivial mitral valve regurgitation. No evidence of mitral stenosis.  4. The aortic valve is tricuspid. Aortic valve regurgitation is mild. No aortic stenosis is present.  5. The inferior vena cava is normal in size with greater than 50% respiratory variability, suggesting right atrial pressure of 3 mmHg. Comparison(s): Changes from prior study are noted. EF roughly the same, but LBBB makes it appear to be 35-40%. Mild AI remains. Pulmonary pressures have improved. FINDINGS  Left Ventricle: Left ventricular ejection fraction, by estimation, is 35 to 40%. The left ventricle has moderately decreased function. The left ventricle demonstrates global hypokinesis. The left ventricular internal cavity size was normal in size. There is no left ventricular hypertrophy. Abnormal (paradoxical) septal motion, consistent with left bundle branch block. Indeterminate diastolic filling due to E-A fusion. Right Ventricle: The right ventricular size is normal. No increase in right ventricular wall thickness. Right ventricular systolic function is normal. Tricuspid regurgitation signal is inadequate for assessing PA  pressure. Left Atrium: Left atrial size was normal in size. Right Atrium: Right atrial size was normal in size. Pericardium: Trivial pericardial effusion is present. Mitral Valve: The mitral valve is grossly normal. Trivial mitral valve regurgitation. No evidence of mitral valve stenosis. Tricuspid Valve: The tricuspid valve is grossly normal.  Tricuspid valve regurgitation is not demonstrated. No evidence of tricuspid stenosis. Aortic Valve: The aortic valve is tricuspid. Aortic valve regurgitation is mild. Aortic regurgitation PHT measures 377 msec. No aortic stenosis is present. Pulmonic Valve: The pulmonic valve was grossly normal. Pulmonic valve regurgitation is not visualized. No evidence of pulmonic stenosis. Aorta: The aortic root and ascending aorta are structurally normal, with no evidence of dilitation. Venous: The right upper pulmonary vein is normal. The inferior vena cava is normal in size with greater than 50% respiratory variability, suggesting right atrial pressure of 3 mmHg. IAS/Shunts: The atrial septum is grossly normal.  LEFT VENTRICLE PLAX 2D LVIDd:         5.10 cm  Diastology LVIDs:         4.30 cm  LV e' medial:  8.27 cm/s LV PW:         0.80 cm  LV e' lateral: 6.09 cm/s LV IVS:        0.60 cm LVOT diam:     2.40 cm LVOT Area:     4.52 cm  LEFT ATRIUM           Index LA diam:      3.70 cm 1.96 cm/m LA Vol (A4C): 55.3 ml 29.36 ml/m  AORTIC VALVE AI PHT:      377 msec  AORTA Ao Root diam: 3.20 cm  SHUNTS Systemic Diam: 2.40 cm Eleonore Chiquito MD Electronically signed by Eleonore Chiquito MD Signature Date/Time: 05/20/2020/3:50:36 PM    Final     Scheduled Meds: . acidophilus  1 capsule Oral Daily  . amantadine  100 mg Oral Daily  . ascorbic acid  500 mg Oral Daily  . aspirin  325 mg Oral Daily  . baclofen  5 mg Oral QID  . buPROPion  150 mg Oral Daily  . DULoxetine  60 mg Oral Daily  . enoxaparin (LOVENOX) injection  40 mg Subcutaneous Q24H  . famotidine  20 mg Oral BID  . feeding  supplement  237 mL Oral BID BM  . ferrous sulfate  325 mg Oral BID WC  . fluticasone  2 spray Each Nare QPM  . gabapentin  800 mg Oral TID  . insulin aspart  0-15 Units Subcutaneous TID WC  . insulin aspart  0-5 Units Subcutaneous QHS  . insulin glargine  10 Units Subcutaneous QHS  . latanoprost  1 drop Both Eyes QHS  . lubiprostone  24 mcg Oral BID WC  . melatonin  10 mg Oral QHS  . metoprolol tartrate  25 mg Oral BID  . multivitamin with minerals  1 tablet Oral Daily  . pantoprazole  40 mg Oral Daily  . potassium chloride SA  20 mEq Oral Daily  . senna-docusate  2 tablet Oral BID  . spironolactone  25 mg Oral Daily  . tamsulosin  0.4 mg Oral Daily   Continuous Infusions: . sodium chloride 150 mL/hr at 05/20/20 0505     LOS: 1 day   Marylu Lund, MD [facility] Pager On Amion  If 7PM-7AM, please contact night-coverage 05/20/2020, 5:19 PM

## 2020-05-20 NOTE — Progress Notes (Signed)
   05/20/20 0043  Assess: MEWS Score  Temp 99.8 F (37.7 C)  BP (!) 160/105  Pulse Rate (!) 123  Resp (!) 22  SpO2 100 %  O2 Device Room Air  Assess: MEWS Score  MEWS Temp 0  MEWS Systolic 0  MEWS Pulse 2  MEWS RR 1  MEWS LOC 0  MEWS Score 3  MEWS Score Color Yellow  Assess: if the MEWS score is Yellow or Red  Were vital signs taken at a resting state? Yes  Focused Assessment No change from prior assessment  Early Detection of Sepsis Score *See Row Information* Low  MEWS guidelines implemented *See Row Information* No, previously yellow, continue vital signs every 4 hours  Notify: Charge Nurse/RN  Name of Charge Nurse/RN Notified Chloe Addison   Date Charge Nurse/RN Notified 05/20/20  Time Charge Nurse/RN Notified [phone]

## 2020-05-21 DIAGNOSIS — R627 Adult failure to thrive: Secondary | ICD-10-CM

## 2020-05-21 LAB — COMPREHENSIVE METABOLIC PANEL
ALT: 35 U/L (ref 0–44)
AST: 31 U/L (ref 15–41)
Albumin: 3.2 g/dL — ABNORMAL LOW (ref 3.5–5.0)
Alkaline Phosphatase: 249 U/L — ABNORMAL HIGH (ref 38–126)
Anion gap: 7 (ref 5–15)
BUN: 7 mg/dL (ref 6–20)
CO2: 24 mmol/L (ref 22–32)
Calcium: 8.4 mg/dL — ABNORMAL LOW (ref 8.9–10.3)
Chloride: 115 mmol/L — ABNORMAL HIGH (ref 98–111)
Creatinine, Ser: 0.83 mg/dL (ref 0.44–1.00)
GFR, Estimated: >60 mL/min (ref >60)
Glucose, Bld: 91 mg/dL (ref 70–99)
Potassium: 4.2 mmol/L (ref 3.5–5.1)
Sodium: 146 mmol/L — ABNORMAL HIGH (ref 135–145)
Total Bilirubin: 0.3 mg/dL (ref 0.3–1.2)
Total Protein: 5.8 g/dL — ABNORMAL LOW (ref 6.5–8.1)

## 2020-05-21 LAB — GLUCOSE, CAPILLARY
Glucose-Capillary: 76 mg/dL (ref 70–99)
Glucose-Capillary: 90 mg/dL (ref 70–99)

## 2020-05-21 LAB — MAGNESIUM: Magnesium: 2.3 mg/dL (ref 1.7–2.4)

## 2020-05-21 MED ORDER — POLYETHYLENE GLYCOL 3350 17 G PO PACK: 17.0000 g | PACK | Freq: Every day | ORAL | 0 refills | Status: DC

## 2020-05-21 MED ORDER — PROSOURCE PLUS PO LIQD: 30.0000 mL | Freq: Every day | ORAL | Status: DC

## 2020-05-21 NOTE — Discharge Summary (Signed)
Physician Discharge Summary  Shannon Garrett PJK:[id number] DOB: Oct 27, 1960 DOA: 05/19/2020  PCP: Gildardo Cranker, DO  Admit date: 05/19/2020 Discharge date: 05/21/2020  Admitted From: [facility] Disposition:  [facility]  Recommendations for Outpatient Follow-up:  1. Follow up with PCP in 1-2 weeks 2. Please ensure regular bowel movements:  Discharge Condition:Improved CODE STATUS:DNR Diet recommendation: Heart healthy   Brief/Interim Summary: 59 y.o.femalewith medical history significant ofcoronary artery disease, history of cardiomyopathy with EF of 20 to 25%, history of Covid pneumonia last year, chronic systolic dysfunction, history of sacral decubitus ulcer, diabetes, hypertension, multiple sclerosis and recent cardiac catheterization in April of this year with clean coronaries who is DNR. Patient resides in a skilled facility and was brought in today with weakness and chest discomfort. Herenzymes and EKG did not show evidence of acute MI. Evaluated for possible decompensation of her cardiac status. Cardiology consulted by ER and suggest noncardiac issue due to recent clean coronaries. Patient has remained persistently tachycardic heart rates in the 130s. She appears to be having supraventricular tachycardia. CT angiogram shows no evidence of PE  Discharge Diagnoses:  Principal Problem:   SVT (supraventricular tachycardia) (HCC) Active Problems:   MS (multiple sclerosis) (HCC)   Chronic diastolic CHF (congestive heart failure) (HCC)   Adult failure to thrive   SIRS (systemic inflammatory response syndrome) (HCC)   Dilated cardiomyopathy (HCC)   Essential hypertension   Chronic pain syndrome   Adrenal insufficiency (Addison's disease) (HCC)   GERD (gastroesophageal reflux disease)   Insulin dependent diabetes mellitus type IA ([place])   1. Sinus tach 1. Presenting HR in the 120-130's 2. On further questioning, pt admits to not taking her meds recently  secondary to feeling "tired of taking" her multiple meds 3. Beta blocker resumed with resumption of rate control 2. MS 1. Cont current regimen 3. Hx Addison's disease 1. Seems to be stable at this time 4. Chronic Systolic CHF 1. Appears euvolemic this AM 2. Repeat 2d echo performed and reviewed. Findings of 35-40% with global hypokinesis, similar to prior 2d echo from 1/21  5. DM2 1. Glucose trends stable and controlled 2. Cont on home lantus with SSI 6. Constipation 1. Reports no BM in over one week, if not longer 2. VERY good results following Mg citrate and soap suds enema 3. Recommend daily miralax and stool softener and to ensure patient has REGULAR BOWEL MOVEMENTS moving forward 7. HTN 1. BP stable at present 2. Cont home med as tolerated 8. Medication noncompliance 1. Pt had admitted to recently not taking her medications since she has become "tired" of taking so many meds 2. Pt is aware of importance of her medications and states she will be more adherent in the future 9. Hypokalemia 1. Replaced  Discharge Instructions   Allergies as of 05/21/2020      Reactions   Sulfonamide Derivatives Hives, Shortness Of Breath   Penicillins Hives, Itching   Prior course of rocephin charted 05/2015 Also note has tolerated Zosyn in past 2016 and 2017      Medication List    STOP taking these medications   diphenhydrAMINE-zinc acetate cream Commonly known as: BENADRYL   fluconazole 200 MG tablet Commonly known as: DIFLUCAN     TAKE these medications   acetaminophen 325 MG tablet Commonly known as: TYLENOL Take 650 mg by mouth every 8 (eight) hours as needed for mild pain, moderate pain or headache.   albuterol (2.5 MG/3ML) 0.083% nebulizer solution Commonly known as: PROVENTIL Take 2.5 mg by  nebulization every 6 (six) hours as needed for wheezing or shortness of breath.   albuterol 108 (90 Base) MCG/ACT inhaler Commonly known as: VENTOLIN HFA Inhale 2 puffs into the  lungs every 6 (six) hours as needed for wheezing or shortness of breath.   amantadine 100 MG capsule Commonly known as: SYMMETREL Take 100 mg by mouth daily.   ascorbic acid 500 MG tablet Commonly known as: VITAMIN C Take 500 mg by mouth daily.   aspirin 325 MG EC tablet Take 325 mg by mouth daily.   Baclofen 5 MG Tabs Take 5 mg by mouth 4 (four) times daily. Hold if lethargic or confused   buPROPion 150 MG 12 hr tablet Commonly known as: WELLBUTRIN SR Take 150 mg by mouth daily.   Cranberry 400 MG Tabs Take 400 mg by mouth daily.   diphenhydrAMINE 2 % cream Commonly known as: BENADRYL Apply 1 application topically every 12 (twelve) hours as needed for itching.   diphenhydrAMINE 25 mg capsule Commonly known as: BENADRYL Take 25 mg by mouth every 6 (six) hours as needed for itching or allergies (sob).   DULoxetine 60 MG capsule Commonly known as: CYMBALTA Take 60 mg by mouth daily.   Ensure Take 237 mLs by mouth 2 (two) times daily between meals.   famotidine 20 MG tablet Commonly known as: PEPCID Take 1 tablet (20 mg total) by mouth 2 (two) times daily.   ferrous sulfate 325 (65 FE) MG tablet Take 325 mg by mouth 2 (two) times daily with a meal.   fluticasone 50 MCG/ACT nasal spray Commonly known as: FLONASE Place 2 sprays into both nostrils every evening.   gabapentin 800 MG tablet Commonly known as: NEURONTIN Take 800 mg by mouth 3 (three) times daily.   hydrocortisone cream 1 % Apply 1 application topically every 12 (twelve) hours as needed for itching.   insulin glargine 100 UNIT/ML injection Commonly known as: LANTUS Inject 0.1 mLs (10 Units total) into the skin daily. What changed: when to take this   Lantus SoloStar 100 UNIT/ML Solostar Pen Generic drug: insulin glargine Inject 10 Units into the skin at bedtime. What changed: Another medication with the same name was changed. Make sure you understand how and when to take each.   latanoprost  0.005 % ophthalmic solution Commonly known as: XALATAN Place 1 drop into both eyes at bedtime.   lubiprostone 24 MCG capsule Commonly known as: AMITIZA Take 24 mcg by mouth 2 (two) times daily with a meal.   melatonin 5 MG Tabs Take 10 mg by mouth at bedtime.   metoprolol succinate 100 MG 24 hr tablet Commonly known as: TOPROL-XL Take 150 mg by mouth daily. Take with or immediately following a meal.   MULTIVITAMIN ADULT PO Take 1 tablet by mouth daily.   NovoLOG FlexPen 100 UNIT/ML FlexPen Generic drug: insulin aspart 0-15 Units, Subcutaneous, 3 times daily with meals CBG < 70: implement hypoglycemia protocol-call MD CBG 70 - 120: 0 units CBG 121 - 150: 2 units CBG 151 - 200: 3 units CBG 201 - 250: 5 units CBG 251 - 300: 8 units CBG 301 - 350: 11 units CBG 351 - 400: 15 units CBG > 400: What changed:   how much to take  how to take this  when to take this  additional instructions   omeprazole 20 MG capsule Commonly known as: PRILOSEC Take 20 mg by mouth daily.   ondansetron 4 MG tablet Commonly known as: ZOFRAN Take 4 mg  by mouth every 6 (six) hours as needed for nausea or vomiting.   polyethylene glycol 17 g packet Commonly known as: MiraLax Mix-In Pax Take 17 g by mouth daily.   Potassium Chloride ER 20 MEQ Tbcr Take 1 tablet by mouth daily.   PROBIOTIC PO Take 1 capsule by mouth daily.   Senokot S 8.6-50 MG tablet Generic drug: senna-docusate Take 2 tablets by mouth 2 (two) times daily.   simethicone 125 MG chewable tablet Commonly known as: MYLICON Chew 426 mg by mouth every 6 (six) hours as needed for flatulence.   spironolactone 25 MG tablet Commonly known as: ALDACTONE Take 25 mg by mouth daily.   tamsulosin 0.4 MG Caps capsule Commonly known as: FLOMAX Take 0.4 mg by mouth daily. For incontinence   traMADol 50 MG tablet Commonly known as: ULTRAM Take 100 mg by mouth every 6 (six) hours as needed (Pain).       Allergies   Allergen Reactions  . Sulfonamide Derivatives Hives and Shortness Of Breath  . Penicillins Hives and Itching    Prior course of rocephin charted 05/2015 Also note has tolerated Zosyn in past 2016 and 2017   Procedures/Studies: CT Angio Chest PE W/Cm &/Or Wo Cm  Result Date: 05/19/2020 CLINICAL DATA:  Complaining of weakness and chest discomfort EXAM: CT ANGIOGRAPHY CHEST WITH CONTRAST TECHNIQUE: Multidetector CT imaging of the chest was performed using the standard protocol during bolus administration of intravenous contrast. Multiplanar CT image reconstructions and MIPs were obtained to evaluate the vascular anatomy. CONTRAST:  121mL OMNIPAQUE IOHEXOL 350 MG/ML SOLN COMPARISON:  None. FINDINGS: Cardiovascular: There is a optimal opacification of the pulmonary arteries. There is no central,segmental, or subsegmental filling defects within the pulmonary arteries. There is mild cardiomegaly present. No pericardial effusion or thickening. No evidence right heart strain. There is normal three-vessel brachiocephalic anatomy without proximal stenosis. Scattered mild aortic atherosclerosis seen. Mediastinum/Nodes: No hilar, mediastinal, or axillary adenopathy. Thyroid gland, trachea, and esophagus demonstrate no significant findings. Lungs/Pleura: Streaky atelectasis seen at both lung bases. No pleural effusion or pneumothorax. No airspace consolidation. Upper Abdomen: No acute abnormalities present in the visualized portions of the upper abdomen. Musculoskeletal: No chest wall abnormality. No acute or significant osseous findings. Review of the MIP images confirms the above findings. IMPRESSION: No central, segmental, or subsegmental pulmonary embolism. Mild cardiomegaly. No other acute intrathoracic pathology to explain the patient's symptoms. Electronically Signed   By: Prudencio Pair M.D.   On: 05/19/2020 22:42   DG Chest Port 1 View  Result Date: 05/19/2020 CLINICAL DATA:  Chest pain, shortness of  breath EXAM: PORTABLE CHEST 1 VIEW COMPARISON:  02/15/2020 FINDINGS: Heart is borderline in size. Lungs clear. No effusions or edema. No acute bony abnormality. IMPRESSION: No active disease. Electronically Signed   By: Rolm Baptise M.D.   On: 05/19/2020 19:14   ECHOCARDIOGRAM COMPLETE  Result Date: 05/20/2020    ECHOCARDIOGRAM REPORT   Patient Name:   Shannon Garrett Date of Exam: 05/20/2020 Medical Rec #:  [id number]     Height:       64.0 in Accession #:    [id number]    Weight:       183.0 lb Date of Birth:  06-03-1961     BSA:          1.884 m Patient Age:    88 years      BP:           138/87 mmHg Patient Gender: F  HR:           100 bpm. Exam Location:  Inpatient Procedure: 2D Echo Indications:    Abnormal ECG R94.31  History:        Patient has prior history of Echocardiogram examinations, most                 recent 07/19/2019.  Sonographer:    Mikki Santee RDCS (AE) Referring Phys: [place]  1. Left ventricular ejection fraction, by estimation, is 35 to 40%. The left ventricle has moderately decreased function. The left ventricle demonstrates global hypokinesis. Indeterminate diastolic filling due to E-A fusion.  2. Right ventricular systolic function is normal. The right ventricular size is normal. Tricuspid regurgitation signal is inadequate for assessing PA pressure.  3. The mitral valve is grossly normal. Trivial mitral valve regurgitation. No evidence of mitral stenosis.  4. The aortic valve is tricuspid. Aortic valve regurgitation is mild. No aortic stenosis is present.  5. The inferior vena cava is normal in size with greater than 50% respiratory variability, suggesting right atrial pressure of 3 mmHg. Comparison(s): Changes from prior study are noted. EF roughly the same, but LBBB makes it appear to be 35-40%. Mild AI remains. Pulmonary pressures have improved. FINDINGS  Left Ventricle: Left ventricular ejection fraction, by estimation, is 35 to 40%. The left  ventricle has moderately decreased function. The left ventricle demonstrates global hypokinesis. The left ventricular internal cavity size was normal in size. There is no left ventricular hypertrophy. Abnormal (paradoxical) septal motion, consistent with left bundle branch block. Indeterminate diastolic filling due to E-A fusion. Right Ventricle: The right ventricular size is normal. No increase in right ventricular wall thickness. Right ventricular systolic function is normal. Tricuspid regurgitation signal is inadequate for assessing PA pressure. Left Atrium: Left atrial size was normal in size. Right Atrium: Right atrial size was normal in size. Pericardium: Trivial pericardial effusion is present. Mitral Valve: The mitral valve is grossly normal. Trivial mitral valve regurgitation. No evidence of mitral valve stenosis. Tricuspid Valve: The tricuspid valve is grossly normal. Tricuspid valve regurgitation is not demonstrated. No evidence of tricuspid stenosis. Aortic Valve: The aortic valve is tricuspid. Aortic valve regurgitation is mild. Aortic regurgitation PHT measures 377 msec. No aortic stenosis is present. Pulmonic Valve: The pulmonic valve was grossly normal. Pulmonic valve regurgitation is not visualized. No evidence of pulmonic stenosis. Aorta: The aortic root and ascending aorta are structurally normal, with no evidence of dilitation. Venous: The right upper pulmonary vein is normal. The inferior vena cava is normal in size with greater than 50% respiratory variability, suggesting right atrial pressure of 3 mmHg. IAS/Shunts: The atrial septum is grossly normal.  LEFT VENTRICLE PLAX 2D LVIDd:         5.10 cm  Diastology LVIDs:         4.30 cm  LV e' medial:  8.27 cm/s LV PW:         0.80 cm  LV e' lateral: 6.09 cm/s LV IVS:        0.60 cm LVOT diam:     2.40 cm LVOT Area:     4.52 cm  LEFT ATRIUM           Index LA diam:      3.70 cm 1.96 cm/m LA Vol (A4C): 55.3 ml 29.36 ml/m  AORTIC VALVE AI PHT:       377 msec  AORTA Ao Root diam: 3.20 cm  SHUNTS Systemic Diam: 2.40 cm  Eleonore Chiquito MD Electronically signed by Eleonore Chiquito MD Signature Date/Time: 05/20/2020/3:50:36 PM    Final     Subjective: Eager to return to facility  Discharge Exam: Vitals:   05/20/20 2037 05/21/20 1442  BP: 120/66 (!) 130/59  Pulse: 98 85  Resp:  16  Temp: 98.2 F (36.8 C) 98.8 F (37.1 C)  SpO2: 99% 100%   Vitals:   05/20/20 0900 05/20/20 1207 05/20/20 2037 05/21/20 1442  BP:  138/87 120/66 (!) 130/59  Pulse:  97 98 85  Resp: (!) 24 (!) 24  16  Temp:  (!) 97.5 F (36.4 C) 98.2 F (36.8 C) 98.8 F (37.1 C)  TempSrc:  Oral Oral Oral  SpO2:  100% 99% 100%  Weight:      Height:        General: Pt is alert, awake, not in acute distress Cardiovascular: RRR, S1/S2 +, no rubs, no gallops Respiratory: CTA bilaterally, no wheezing, no rhonchi Abdominal: Soft, NT, ND, bowel sounds + Extremities: no edema, no cyanosis   The results of significant diagnostics from this hospitalization (including imaging, microbiology, ancillary and laboratory) are listed below for reference.     Microbiology: Recent Results (from the past 240 hour(s))  Resp Panel by RT-PCR (Flu A&B, Covid) Nasopharyngeal Swab     Status: None   Collection Time: 05/19/20 10:50 PM   Specimen: Nasopharyngeal Swab; Nasopharyngeal(NP) swabs in vial transport medium  Result Value Ref Range Status   SARS Coronavirus 2 by RT PCR NEGATIVE NEGATIVE Final    Comment: (NOTE) SARS-CoV-2 target nucleic acids are NOT DETECTED.  The SARS-CoV-2 RNA is generally detectable in upper respiratory specimens during the acute phase of infection. The lowest concentration of SARS-CoV-2 viral copies this assay can detect is 138 copies/mL. A negative result does not preclude SARS-Cov-2 infection and should not be used as the sole basis for treatment or other patient management decisions. A negative result may occur with  improper specimen  collection/handling, submission of specimen other than nasopharyngeal swab, presence of viral mutation(s) within the areas targeted by this assay, and inadequate number of viral copies(<138 copies/mL). A negative result must be combined with clinical observations, patient history, and epidemiological information. The expected result is Negative.  Fact Sheet for Patients:  EntrepreneurPulse.com.au  Fact Sheet for Healthcare Providers:  IncredibleEmployment.be  This test is no t yet approved or cleared by the [place] FDA and  has been authorized for detection and/or diagnosis of SARS-CoV-2 by FDA under an Emergency Use Authorization (EUA). This EUA will remain  in effect (meaning this test can be used) for the duration of the COVID-19 declaration under Section 564(b)(1) of the Act, 21 U.S.C.section 360bbb-3(b)(1), unless the authorization is terminated  or revoked sooner.       Influenza A by PCR NEGATIVE NEGATIVE Final   Influenza B by PCR NEGATIVE NEGATIVE Final    Comment: (NOTE) The Xpert Xpress SARS-CoV-2/FLU/RSV plus assay is intended as an aid in the diagnosis of influenza from Nasopharyngeal swab specimens and should not be used as a sole basis for treatment. Nasal washings and aspirates are unacceptable for Xpert Xpress SARS-CoV-2/FLU/RSV testing.  Fact Sheet for Patients: EntrepreneurPulse.com.au  Fact Sheet for Healthcare Providers: IncredibleEmployment.be  This test is not yet approved or cleared by the [place] FDA and has been authorized for detection and/or diagnosis of SARS-CoV-2 by FDA under an Emergency Use Authorization (EUA). This EUA will remain in effect (meaning this test can be used) for the duration of  the COVID-19 declaration under Section 564(b)(1) of the Act, 21 U.S.C. section 360bbb-3(b)(1), unless the authorization is terminated or revoked.  Performed at [facility], [place] [address], [place], [place] [address]   MRSA PCR Screening     Status: None   Collection Time: 05/20/20  2:08 AM   Specimen: Nasal Mucosa; Nasopharyngeal  Result Value Ref Range Status   MRSA by PCR NEGATIVE NEGATIVE Final    Comment:        The GeneXpert MRSA Assay (FDA approved for NASAL specimens only), is one component of a comprehensive MRSA colonization surveillance program. It is not intended to diagnose MRSA infection nor to guide or monitor treatment for MRSA infections. Performed at [facility], [place] [address], [place], [place] [address]      Labs: BNP (last 3 results) No results for input(s): BNP in the last 8760 hours. Basic Metabolic Panel: Recent Labs  Lab 05/19/20 1844 05/20/20 0628 05/21/20 0841  NA 145 144 146*  K 4.4 3.1* 4.2  CL 107 109 115*  CO2 25 23 24   GLUCOSE 123* 183* 91  BUN 11 10 7   CREATININE 0.90 0.83  0.83 0.83  CALCIUM 9.5 9.0 8.4*  MG  --   --  2.3   Liver Function Tests: Recent Labs  Lab 05/19/20 1844 05/20/20 0628 05/21/20 0841  AST 37 32 31  ALT 35 32 35  ALKPHOS 342* 286* 249*  BILITOT 0.4 0.5 0.3  PROT 8.0 6.8 5.8*  ALBUMIN 4.2 3.6 3.2*   Recent Labs  Lab 05/19/20 1844  LIPASE 22   No results for input(s): AMMONIA in the last 168 hours. CBC: Recent Labs  Lab 05/19/20 1844 05/20/20 0628  WBC 7.1 6.5  6.7  NEUTROABS 3.5  --   HGB 13.9 12.1  12.2  HCT 42.2 37.2  37.3  MCV 85.4 84.5  84.8  PLT 369 301  300   Cardiac Enzymes: No results for input(s): CKTOTAL, CKMB, CKMBINDEX, TROPONINI in the last 168 hours. BNP: Invalid input(s): POCBNP CBG: Recent Labs  Lab 05/20/20 1203 05/20/20 1601 05/20/20 2221 05/21/20 0824 05/21/20 1159  GLUCAP 100* 100* 159* 76 90   D-Dimer No results for input(s): DDIMER in the last 72 hours. Hgb A1c Recent Labs    05/20/20 0628  HGBA1C 5.3   Lipid Profile No results for input(s): CHOL, HDL, LDLCALC, TRIG,  CHOLHDL, LDLDIRECT in the last 72 hours. Thyroid function studies No results for input(s): TSH, T4TOTAL, T3FREE, THYROIDAB in the last 72 hours.  Invalid input(s): FREET3 Anemia work up No results for input(s): VITAMINB12, FOLATE, FERRITIN, TIBC, IRON, RETICCTPCT in the last 72 hours. Urinalysis    Component Value Date/Time   COLORURINE AMBER (A) 02/16/2020 0535   APPEARANCEUR CLOUDY (A) 02/16/2020 0535   LABSPEC 1.019 02/16/2020 0535   PHURINE 5.0 02/16/2020 0535   GLUCOSEU NEGATIVE 02/16/2020 0535   HGBUR SMALL (A) 02/16/2020 0535   BILIRUBINUR SMALL (A) 02/16/2020 0535   KETONESUR NEGATIVE 02/16/2020 0535   PROTEINUR 30 (A) 02/16/2020 0535   UROBILINOGEN 1.0 01/10/2015 2311   NITRITE NEGATIVE 02/16/2020 0535   LEUKOCYTESUR MODERATE (A) 02/16/2020 0535   Sepsis Labs Invalid input(s): PROCALCITONIN,  WBC,  LACTICIDVEN Microbiology Recent Results (from the past 240 hour(s))  Resp Panel by RT-PCR (Flu A&B, Covid) Nasopharyngeal Swab     Status: None   Collection Time: 05/19/20 10:50 PM   Specimen: Nasopharyngeal Swab; Nasopharyngeal(NP) swabs in vial transport medium  Result Value Ref  Range Status   SARS Coronavirus 2 by RT PCR NEGATIVE NEGATIVE Final    Comment: (NOTE) SARS-CoV-2 target nucleic acids are NOT DETECTED.  The SARS-CoV-2 RNA is generally detectable in upper respiratory specimens during the acute phase of infection. The lowest concentration of SARS-CoV-2 viral copies this assay can detect is 138 copies/mL. A negative result does not preclude SARS-Cov-2 infection and should not be used as the sole basis for treatment or other patient management decisions. A negative result may occur with  improper specimen collection/handling, submission of specimen other than nasopharyngeal swab, presence of viral mutation(s) within the areas targeted by this assay, and inadequate number of viral copies(<138 copies/mL). A negative result must be combined with clinical  observations, patient history, and epidemiological information. The expected result is Negative.  Fact Sheet for Patients:  EntrepreneurPulse.com.au  Fact Sheet for Healthcare Providers:  IncredibleEmployment.be  This test is no t yet approved or cleared by the [place] FDA and  has been authorized for detection and/or diagnosis of SARS-CoV-2 by FDA under an Emergency Use Authorization (EUA). This EUA will remain  in effect (meaning this test can be used) for the duration of the COVID-19 declaration under Section 564(b)(1) of the Act, 21 U.S.C.section 360bbb-3(b)(1), unless the authorization is terminated  or revoked sooner.       Influenza A by PCR NEGATIVE NEGATIVE Final   Influenza B by PCR NEGATIVE NEGATIVE Final    Comment: (NOTE) The Xpert Xpress SARS-CoV-2/FLU/RSV plus assay is intended as an aid in the diagnosis of influenza from Nasopharyngeal swab specimens and should not be used as a sole basis for treatment. Nasal washings and aspirates are unacceptable for Xpert Xpress SARS-CoV-2/FLU/RSV testing.  Fact Sheet for Patients: EntrepreneurPulse.com.au  Fact Sheet for Healthcare Providers: IncredibleEmployment.be  This test is not yet approved or cleared by the [place] FDA and has been authorized for detection and/or diagnosis of SARS-CoV-2 by FDA under an Emergency Use Authorization (EUA). This EUA will remain in effect (meaning this test can be used) for the duration of the COVID-19 declaration under Section 564(b)(1) of the Act, 21 U.S.C. section 360bbb-3(b)(1), unless the authorization is terminated or revoked.  Performed at [facility], [place] [address], [place], [place] [address]   MRSA PCR Screening     Status: None   Collection Time: 05/20/20  2:08 AM   Specimen: Nasal Mucosa; Nasopharyngeal  Result Value Ref Range Status   MRSA by PCR NEGATIVE NEGATIVE  Final    Comment:        The GeneXpert MRSA Assay (FDA approved for NASAL specimens only), is one component of a comprehensive MRSA colonization surveillance program. It is not intended to diagnose MRSA infection nor to guide or monitor treatment for MRSA infections. Performed at [facility], [place] [address], [place], [place] [address]    Time spent: 30 min  SIGNED:   Marylu Lund, MD  [facility] 05/21/2020, 2:55 PM  If 7PM-7AM, please contact night-coverage

## 2020-05-21 NOTE — Progress Notes (Signed)
Report called to [facility]

## 2020-05-21 NOTE — Progress Notes (Signed)
Initial Nutrition Assessment  RD working remotely.   DOCUMENTATION CODES:   Obesity unspecified  INTERVENTION:  - continue Ensure Enlive BID, each supplement provides 350 kcal and 20 grams of protein. - will order 30 ml Prosource Plus once/day, each supplement provides 100 kcal and 15 grams protein.  - will complete NFPE at follow-up.   NUTRITION DIAGNOSIS:   Increased nutrient needs related to acute illness as evidenced by estimated needs.  GOAL:   Patient will meet greater than or equal to 90% of their needs  MONITOR:   PO intake, Supplement acceptance, Labs, Weight trends  REASON FOR ASSESSMENT:   Malnutrition Screening Tool    ASSESSMENT:   59 y.o. female with medical history of CAD, cardiomyopathy, COVID PNA, CHF, sacral decubitus ulcer, DM, HTN, multiple sclerosis, and recent cardiac catheterization in April. She resides at [facility] and presented to the ED due to weakness and chest discomfort. CT angiogram shows no evidence of PE.  No intakes documented since admission. Ensure ordered BID yesterday and patient has accepted both bottles offered to her so far.   Unable to reach patient by phone.  Weight yesterday was documented as 183 lb and on 11/18 as 249 lb. Weight yesterday is an outliner compared to weights over the past 2 years. The two most recently recorded weights PTA were on 02/15/20 when she weighed 249 lb and on 10/29/19 she weighed 199 lb.  Per notes: - sinus tach - hx of MS and Addison's disease - constipation - medication non-compliance d/t patient reporting she has not been taking meds recently because she is tired of taking so many - hypokalemia--repletion ordered    Labs reviewed; CBG: 76 mg/dl, Na: 146 mmol/l, Cl: 115 mmol/l, Ca: 8.4 mg/dl, Alk Phos elevated. Medications reviewed; 1 capsule risaquad/day, 500 mg ascorbic acid/day, 20 mg oral pepcid/day, 325 mg ferrous sulfate BID, sliding scale novolog, 10 units lantus/day, 10 mg melatonin/night, 1  tablet multivitamin with minerals/day, 20 mEq Klor-Con/day, 2 tablets senokot BID, 25 mg aldactone/day. IVF; NS @ 150 ml/hr.     NUTRITION - FOCUSED PHYSICAL EXAM:  unable to complete at this time.   Diet Order:   Diet Order            Diet heart healthy/carb modified Room service appropriate? Yes; Fluid consistency: Thin  Diet effective now                 EDUCATION NEEDS:   No education needs have been identified at this time  Skin:  Skin Assessment: Reviewed RN Assessment  Last BM:  11/20 (type 7)  Height:   Ht Readings from Last 1 Encounters:  05/20/20 _0  (1.626 m)    Weight:   Wt Readings from Last 1 Encounters:  05/20/20 83 kg     Estimated Nutritional Needs:  Kcal:  1670-1910 kcal Protein:  80-90 grams Fluid:  >/= 1.7 L/day      Jarome Matin, MS, RD, LDN, CNSC Inpatient Clinical Dietitian RD pager # available in [facility]  After hours/weekend pager # available in [facility]

## 2020-05-21 NOTE — TOC Transition Note (Signed)
Transition of Care [facility]) - CM/SW Discharge Note   Patient Details  Name: Shannon Garrett MRN: [id number] Date of Birth: 1960-12-11  Transition of Care [facility] CM/SW Contact:  Trish Mage, LCSW Phone Number: 05/21/2020, 11:31 AM   Clinical Narrative:   Patient who is LTC resident at [facility] is stable for transfer back today.  Spoke to RN at [facility] who confirmed that we can send [name] today.  PTAR arranged. FL2, D/C summary posted on HUB.  Nursing, please call report to [phone]. TOC sign off.    Final next level of care: Skilled Nursing Facility Barriers to Discharge: No Barriers Identified   Patient Goals and CMS Choice        Discharge Placement                       Discharge Plan and Services                                     Social Determinants of Health (SDOH) Interventions     Readmission Risk Interventions No flowsheet data found.

## 2020-06-09 ENCOUNTER — Encounter: Payer: Medicare (Managed Care) | Admitting: Physician Assistant

## 2020-06-11 NOTE — Progress Notes (Signed)
This encounter was created in error - please disregard.

## 2020-09-29 ENCOUNTER — Telehealth: Payer: Self-pay | Admitting: Cardiology

## 2020-09-29 NOTE — Telephone Encounter (Signed)
Primary Cardiologist:Peter Martinique, MD  Chart reviewed as part of pre-operative protocol coverage. Because of Shannon Garrett's past medical history and time since last visit, he/she will require a follow-up visit in order to better assess preoperative cardiovascular risk.  Pre-op covering staff: - Please schedule appointment and call patient to inform them. - Please contact requesting surgeon's office via preferred method (i.e, phone, fax) to inform them of need for appointment prior to surgery.  If applicable, this message will also be routed to pharmacy pool and/or primary cardiologist for input on holding anticoagulant/antiplatelet agent as requested below so that this information is available at time of patient's appointment.   Deberah Pelton, NP  09/29/2020, 3:22 PM

## 2020-09-29 NOTE — Telephone Encounter (Signed)
      [facility] Pre-operative Risk Assessment    HEARTCARE STAFF: - Please ensure there is not already an duplicate clearance open for this procedure. - Under Visit Info/Reason for Call, type in Other and utilize the format Clearance MM/DD/YY or Clearance TBD. Do not use dashes or single digits. - If request is for dental extraction, please clarify the # of teeth to be extracted.  Request for surgical clearance:  1. What type of surgery is being performed? Superapubic catheter  2. When is this surgery scheduled? 10/10/20  3. What type of clearance is required (medical clearance vs. Pharmacy clearance to hold med vs. Both)? Medical  4. Are there any medications that need to be held prior to surgery and how long? Any necessary meds (if any) pt needs to help prior procedure   5. Practice name and name of physician performing surgery? Dr. Lenn Sink - surgeon ; Dr. Marion Downer - Anesthesiolohgist  6. What is the office phone number? [phone]   7.   What is the office fax number? [phone] ATTN: Juliann Pulse  8.   Anesthesia type (None, local, MAC, general) ? Not sure   Shannon Garrett 09/29/2020, 3:06 PM  _________________________________________________________________   (provider comments below)

## 2020-09-29 NOTE — Telephone Encounter (Signed)
Tried one more time to reach pt to confirm her appt for pre op clearance, see previous notes. I will try her cell # as well.   I tried to reach pt on what is listed as her cell # though a woman names Jasmine answered as with the name of a business. I apologized for the call.

## 2020-09-29 NOTE — Telephone Encounter (Signed)
Left message for the pt that she will need a pre op appt for clearance. I had an opening come up for tomorrow 09/30/20 @ 2:45 with Jory Sims, DNP. I took the liberty to schedule the pt as all of our providers are extremely booked up. The office does our best to not delay any pt's procedure or surgery. Left message for the pt to please call the office to confirm her appt for tomorrow or change the appt if need too, though we may not not have anything available before her procedure date of 10/10/20.

## 2020-09-30 ENCOUNTER — Ambulatory Visit: Payer: Medicare (Managed Care) | Admitting: Adult Health

## 2020-09-30 NOTE — Progress Notes (Deleted)
Cardiology Office Note   Date:  09/30/2020   ID:  Madden Piazza, DOB 10-22-1960, MRN [id number]  PCP:  No primary care provider on file.  Cardiologist:  No chief complaint on file.    History of Present Illness: Shannon Garrett is a 60 y.o. female who presents for ongoing assessment and management of hypertension, chronic combined heart failure, most recent echocardiogram revealing ejection fraction 45%.  Other history includes insulin-dependent type 2 diabetes, advanced multiple sclerosis with bedbound status.  She was recently discharged from the hospital on 07/23/2019 for stage II sacral decubitus ulcer.  She is a resident of a skilled nursing facility.  She presents today for pre-operative evaluation to have placement of a suprapubic catheter on 10/10/2020 by Dr. Lenn Sink..     Past Medical History:  Diagnosis Date  . Cardiomyopathy ([place])    a.  Echo 04/29/12: Mild LVH, EF 20-25%, mild AI, moderate MR, moderate LAE, mild RAE, mild RVE, moderate TR, PASP 51, small pericardial effusion;   b. probably non-ischemic given multiple chemo-Tx agents used for MS and global LV dysfn on echo  . Chronic systolic heart failure ([place])   . COVID-19 05/10/2019   Per facility paperwork  . Decubitus ulcer    stage II sacral decubitus ulcer.   . Depression   . Glaucoma   . Hypertension   . MS (multiple sclerosis) ([place])    a. Dx'd late 20's. b. Tx with Novantrone, Tysabri, Copaxone previously.    Past Surgical History:  Procedure Laterality Date  . ABLATION     uterine  . BILIARY DILATION  02/15/2020   Procedure: BILIARY DILATION;  Surgeon: Rush Landmark Telford Nab., MD;  Location: [facility];  Service: Gastroenterology;;  . CESAREAN SECTION    . CHOLECYSTECTOMY N/A 07/20/2019   Procedure: DIAGNOSTIC LAPAROSCOPY;  Surgeon: [name], MD;  Location: [facility];  Service: General;  Laterality: N/A;  . CHOLECYSTECTOMY N/A 02/12/2020   Procedure: LAPAROSCOPIC CHOLECYSTECTOMY;  Surgeon: Clovis Riley, MD;  Location: [facility];  Service: General;  Laterality: N/A;  . ERCP N/A 02/15/2020   Procedure: ENDOSCOPIC RETROGRADE CHOLANGIOPANCREATOGRAPHY (ERCP);  Surgeon: Irving Copas., MD;  Location: [facility];  Service: Gastroenterology;  Laterality: N/A;  . IR EXCHANGE BILIARY DRAIN  09/07/2019  . IR PERC CHOLECYSTOSTOMY  07/21/2019  . LEFT HEART CATH AND CORONARY ANGIOGRAPHY N/A 10/29/2019   Procedure: LEFT HEART CATH AND CORONARY ANGIOGRAPHY;  Surgeon: Leonie Man, MD;  Location: [facility];  Service: Cardiovascular;  Laterality: N/A;  . REMOVAL OF STONES  02/15/2020   Procedure: REMOVAL OF STONES;  Surgeon: Rush Landmark Telford Nab., MD;  Location: [facility];  Service: Gastroenterology;;  . Joan Mayans  02/15/2020   Procedure: Joan Mayans;  Surgeon: Rush Landmark Telford Nab., MD;  Location: [facility];  Service: Gastroenterology;;     Current Outpatient Medications  Medication Sig Dispense Refill  . acetaminophen (TYLENOL) 325 MG tablet Take 650 mg by mouth every 8 (eight) hours as needed for mild pain, moderate pain or headache.     . albuterol (PROVENTIL) (2.5 MG/3ML) 0.083% nebulizer solution Take 2.5 mg by nebulization every 6 (six) hours as needed for wheezing or shortness of breath.    Marland Kitchen albuterol (VENTOLIN HFA) 108 (90 Base) MCG/ACT inhaler Inhale 2 puffs into the lungs every 6 (six) hours as needed for wheezing or shortness of breath.     Marland Kitchen amantadine (SYMMETREL) 100 MG capsule Take 100 mg by mouth daily.     Marland Kitchen ascorbic acid (VITAMIN C)  500 MG tablet Take 500 mg by mouth daily.     Marland Kitchen aspirin 325 MG EC tablet Take 325 mg by mouth daily.    . Baclofen 5 MG TABS Take 5 mg by mouth 4 (four) times daily. Hold if lethargic or confused     . buPROPion (WELLBUTRIN SR) 150 MG 12 hr tablet Take 150 mg by mouth daily.    . Cranberry 400 MG TABS Take 400 mg by mouth daily.     . diphenhydrAMINE (BENADRYL) 2 % cream Apply 1 application topically every 12 (twelve)  hours as needed for itching.    . diphenhydrAMINE (BENADRYL) 25 mg capsule Take 25 mg by mouth every 6 (six) hours as needed for itching or allergies (sob).     . DULoxetine (CYMBALTA) 60 MG capsule Take 60 mg by mouth daily.    . Ensure (ENSURE) Take 237 mLs by mouth 2 (two) times daily between meals.    . famotidine (PEPCID) 20 MG tablet Take 1 tablet (20 mg total) by mouth 2 (two) times daily.    . ferrous sulfate 325 (65 FE) MG tablet Take 325 mg by mouth 2 (two) times daily with a meal.     . fluticasone (FLONASE) 50 MCG/ACT nasal spray Place 2 sprays into both nostrils every evening.    . gabapentin (NEURONTIN) 800 MG tablet Take 800 mg by mouth 3 (three) times daily.    . hydrocortisone cream 1 % Apply 1 application topically every 12 (twelve) hours as needed for itching.    . insulin aspart (NOVOLOG FLEXPEN) 100 UNIT/ML FlexPen 0-15 Units, Subcutaneous, 3 times daily with meals CBG < 70: implement hypoglycemia protocol-call MD CBG 70 - 120: 0 units CBG 121 - 150: 2 units CBG 151 - 200: 3 units CBG 201 - 250: 5 units CBG 251 - 300: 8 units CBG 301 - 350: 11 units CBG 351 - 400: 15 units CBG > 400: (Patient taking differently: Inject 0-15 Units into the skin 3 (three) times daily with meals. 0-15 Units, Subcutaneous, 3 times daily with meals CBG < 70: implement hypoglycemia protocol-call MD CBG 0- 120: 0 units CBG 121 - 150: 2 units CBG 151 - 200: 3 units CBG 201 - 250: 5 units CBG 251 - 300: 8 units CBG 301 - 400: 15 units CBG >400, CALL MD) 15 mL 0  . insulin glargine (LANTUS) 100 UNIT/ML injection Inject 0.1 mLs (10 Units total) into the skin daily. (Patient taking differently: Inject 10 Units into the skin at bedtime. ) 10 mL 11  . LANTUS SOLOSTAR 100 UNIT/ML Solostar Pen Inject 10 Units into the skin at bedtime.     Marland Kitchen latanoprost (XALATAN) 0.005 % ophthalmic solution Place 1 drop into both eyes at bedtime.    Marland Kitchen lubiprostone (AMITIZA) 24 MCG capsule Take 24 mcg by mouth 2  (two) times daily with a meal.    . Melatonin 5 MG TABS Take 10 mg by mouth at bedtime.     . metoprolol succinate (TOPROL-XL) 100 MG 24 hr tablet Take 150 mg by mouth daily. Take with or immediately following a meal.    . Multiple Vitamins-Minerals (MULTIVITAMIN ADULT PO) Take 1 tablet by mouth daily.    Marland Kitchen omeprazole (PRILOSEC) 20 MG capsule Take 20 mg by mouth daily.    . ondansetron (ZOFRAN) 4 MG tablet Take 4 mg by mouth every 6 (six) hours as needed for nausea or vomiting.    . polyethylene glycol (MIRALAX MIX-IN PAX) 17  g packet Take 17 g by mouth daily. 14 each 0  . Potassium Chloride ER 20 MEQ TBCR Take 1 tablet by mouth daily.    . Probiotic Product (PROBIOTIC PO) Take 1 capsule by mouth daily.    Marland Kitchen senna-docusate (SENOKOT S) 8.6-50 MG tablet Take 2 tablets by mouth 2 (two) times daily.     . simethicone (MYLICON) 884 MG chewable tablet Chew 125 mg by mouth every 6 (six) hours as needed for flatulence.    Marland Kitchen spironolactone (ALDACTONE) 25 MG tablet Take 25 mg by mouth daily.    . tamsulosin (FLOMAX) 0.4 MG CAPS capsule Take 0.4 mg by mouth daily. For incontinence    . traMADol (ULTRAM) 50 MG tablet Take 100 mg by mouth every 6 (six) hours as needed (Pain).     No current facility-administered medications for this visit.    Allergies:   Sulfonamide derivatives and Penicillins    Social History:  The patient  reports that she has quit smoking. She has a 2.50 pack-year smoking history. She has never used smokeless tobacco. She reports that she does not drink alcohol and does not use drugs.   Family History:  The patient's family history includes Cancer in her father and mother; Hypertension in her mother; Multiple sclerosis in her sister.    ROS: All other systems are reviewed and negative. Unless otherwise mentioned in H&P    PHYSICAL EXAM: VS:  There were no vitals taken for this visit. , BMI There is no height or weight on file to calculate BMI. GEN: Well nourished, well  developed, in no acute distress HEENT: normal Neck: no JVD, carotid bruits, or masses Cardiac: ***RRR; no murmurs, rubs, or gallops,no edema  Respiratory:  Clear to auscultation bilaterally, normal work of breathing GI: soft, nontender, nondistended, + BS MS: no deformity or atrophy Skin: warm and dry, no rash Neuro:  Strength and sensation are intact Psych: euthymic mood, full affect   EKG:  EKG {ACTION; IS/IS [id number]} ordered today. The ekg ordered today demonstrates ***   Recent Labs: 05/20/2020: Hemoglobin 12.1; Hemoglobin 12.2; Platelets 301; Platelets 300 05/21/2020: ALT 35; BUN 7; Creatinine, Ser 0.83; Magnesium 2.3; Potassium 4.2; Sodium 146    Lipid Panel    Component Value Date/Time   CHOL 183 12/10/2017 0000   TRIG 273 (H) 05/10/2019 1452   HDL 31 (A) 12/10/2017 0000   CHOLHDL 6.3 05/01/2012 0500   VLDL 14 05/01/2012 0500   LDLCALC 98 12/10/2017 0000      Wt Readings from Last 3 Encounters:  05/20/20 182 lb 15.7 oz (83 kg)  02/15/20 249 lb 12.5 oz (113.3 kg)  10/29/19 200 lb (90.7 kg)      Other studies Reviewed: [facility] 10/29/2019    There is mild left ventricular systolic dysfunction.  The left ventricular ejection fraction is 45-50% by visual estimate.  There is no aortic valve stenosis.  Angiographically normal coronary arteries    Angiographically normal coronary arteries with a codominant system but the RCA is relatively diminutive.  Nonischemic Cardiomyopathy with mildly reduced EF of roughly 45%, global hypokinesis.  Minimally elevated LVEDP  Echocardiogram 07/21/2019 . Left ventricular ejection fraction, by estimation, is 35 to 40%. The  left ventricle has moderately decreased function. The left ventricle  demonstrates global hypokinesis. Indeterminate diastolic filling due to  E-A fusion.  2. Right ventricular systolic function is normal. The right ventricular  size is normal. Tricuspid regurgitation signal is inadequate for  assessing  PA pressure.  3.  The mitral valve is grossly normal. Trivial mitral valve  regurgitation. No evidence of mitral stenosis.  4. The aortic valve is tricuspid. Aortic valve regurgitation is mild. No  aortic stenosis is present.  5. The inferior vena cava is normal in size with greater than 50%  respiratory variability, suggesting right atrial pressure of 3 mmHg.   Comparison(s): Changes from prior study are noted. EF roughly the same,  but LBBB makes it appear to be 35-40%. Mild AI remains. Pulmonary  pressures have improved.   ASSESSMENT AND PLAN:  1.  ***   Current medicines are reviewed at length with the patient today.  I have spent *** dedicated to the care of this patient on the date of this encounter to include pre-visit review of records, assessment, management and diagnostic testing,with shared decision making.  Labs/ tests ordered today include: *** Phill Myron. West Pugh, ANP, AACC   09/30/2020 2:17 PM    [address] Office [phone] Fax [phone]  Notice: This dictation was prepared with Dragon dictation along with smaller phrase technology. Any transcriptional errors that result from this process are unintentional and may not be corrected upon review.

## 2020-09-30 NOTE — Telephone Encounter (Signed)
Tried to reach pt one more time to confirm pre op appt for today. See previous notes.

## 2020-10-05 NOTE — Telephone Encounter (Signed)
   Juliann Pulse called back she said pt is in [place] at [place] [facility], she gave number [phone] to reach pt.

## 2020-10-05 NOTE — Telephone Encounter (Signed)
Pre-op covering staff, can you please notify requesting office that patient requires an office visit prior to surgery? Looks like we tried to contact her to inform her of this and even scheduled her a visit but were unable to get a hold of her. Can you also please try to contact patient again and arrange visit?  Will remove from pre-op pool.  Thank you!

## 2020-10-06 NOTE — Telephone Encounter (Signed)
Patient is scheduled to see Coletta Memos, NP on 11/03/20. Will call the requesting office to make them aware.

## 2020-10-06 NOTE — Telephone Encounter (Signed)
Called the requesting office and left a voice message for the surgery coordinator with the details of the patient appointment and time.

## 2020-11-01 NOTE — Progress Notes (Signed)
Cardiology Clinic Note   Patient Name: Shannon Garrett Date of Encounter: 11/03/2020  Primary Care Provider:  Pcp, No Primary Cardiologist:  Peter Martinique, MD  Patient Profile    Shannon Garrett 60 year old female presents to the clinic today for follow-up evaluation of her chronic diastolic CHF and preoperative cardiac evaluation.  Past Medical History    Past Medical History:  Diagnosis Date  . Cardiomyopathy ([place])    a.  Echo 04/29/12: Mild LVH, EF 20-25%, mild AI, moderate MR, moderate LAE, mild RAE, mild RVE, moderate TR, PASP 51, small pericardial effusion;   b. probably non-ischemic given multiple chemo-Tx agents used for MS and global LV dysfn on echo  . Chronic systolic heart failure ([place])   . COVID-19 05/10/2019   Per facility paperwork  . Decubitus ulcer    stage II sacral decubitus ulcer.   . Depression   . Glaucoma   . Hypertension   . MS (multiple sclerosis) ([place])    a. Dx'd late 20's. b. Tx with Novantrone, Tysabri, Copaxone previously.   Past Surgical History:  Procedure Laterality Date  . ABLATION     uterine  . BILIARY DILATION  02/15/2020   Procedure: BILIARY DILATION;  Surgeon: Rush Landmark Telford Nab., MD;  Location: [facility];  Service: Gastroenterology;;  . CESAREAN SECTION    . CHOLECYSTECTOMY N/A 07/20/2019   Procedure: DIAGNOSTIC LAPAROSCOPY;  Surgeon: [name], MD;  Location: [facility];  Service: General;  Laterality: N/A;  . CHOLECYSTECTOMY N/A 02/12/2020   Procedure: LAPAROSCOPIC CHOLECYSTECTOMY;  Surgeon: Clovis Riley, MD;  Location: [facility];  Service: General;  Laterality: N/A;  . ERCP N/A 02/15/2020   Procedure: ENDOSCOPIC RETROGRADE CHOLANGIOPANCREATOGRAPHY (ERCP);  Surgeon: Irving Copas., MD;  Location: [facility];  Service: Gastroenterology;  Laterality: N/A;  . IR EXCHANGE BILIARY DRAIN  09/07/2019  . IR PERC CHOLECYSTOSTOMY  07/21/2019  . LEFT HEART CATH AND CORONARY ANGIOGRAPHY N/A 10/29/2019   Procedure: LEFT HEART CATH AND  CORONARY ANGIOGRAPHY;  Surgeon: Leonie Man, MD;  Location: [facility];  Service: Cardiovascular;  Laterality: N/A;  . REMOVAL OF STONES  02/15/2020   Procedure: REMOVAL OF STONES;  Surgeon: Rush Landmark Telford Nab., MD;  Location: [facility];  Service: Gastroenterology;;  . Joan Mayans  02/15/2020   Procedure: Joan Mayans;  Surgeon: Irving Copas., MD;  Location: [facility];  Service: Gastroenterology;;    Allergies  Allergies  Allergen Reactions  . Sulfonamide Derivatives Hives and Shortness Of Breath  . Penicillins Hives and Itching    Prior course of rocephin charted 05/2015 Also note has tolerated Zosyn in past 2016 and 2017    History of Present Illness    Shannon Garrett has a PMH of chronic diastolic CHF, dilated cardiomyopathy, essential hypertension, SVT, GERD, adrenal insufficiency, obesity, insomnia, depression, hypokalemia, dyslipidemia, COVID-19 infection, and abnormal stress test.  She was seen by Jory Sims, DNP 10/26/2019.  During that time she was seen for preoperative cardiac evaluation.  She had been discharged from the hospital 07/23/2019 for stage II sacral decubitus ulcer.  She was a resident of skilled nursing facility.  She had previously been seen and evaluated for presurgical cardiac work-up 1/21 and was found to be intermediate risk due to nonischemic cardiomyopathy.  She did undergo surgery for her planned cholecystectomy.  However, due to significant inflammation in the right upper quadrant and poor visualization of the gallbladder a cholecystostomy tube was inserted.  She underwent nuclear stress test 09/17/2019 which showed moderate reversible defect involving mid  lateral wall and fixed defect involving the apex and septum.  It was felt by Dr. Martinique that before proceeding with any surgical interventions a cardiac catheterization for further evaluation was needed.  She underwent cardiac catheterization 10/29/2019 which showed mild left  systolic dysfunction EF 03-47%, no aortic valve stenosis, normal coronary arteries and minimally elevated LVEDP.  She presents the clinic today for follow-up evaluation and preoperative cardiac evaluation.  She states she feels well today.  She continues to be a [facility] at [facility].  We reviewed her cardiac catheterization and previous echocardiogram.  She denies any recent episodes of chest pain.  She reports that she has occasional very brief episodes of palpitations that last for seconds at a time dissipate without intervention.  We reviewed her need for suprapubic catheter placement.  She reports that she does require frequent antibiotics due to frequent UTIs.  We reviewed her RCRI risk.  She expresses understanding and wishes to proceed with the procedure.  I will continue her current medication regimen, send her preoperative cardiac evaluation form to urology, and have her follow-up in 6 months.  Today she denies chest pain, shortness of breath, lower extremity edema, fatigue, palpitations, melena, hematuria, hemoptysis, diaphoresis, weakness, presyncope, syncope, orthopnea, and PND.   Home Medications    Prior to Admission medications   Medication Sig Start Date End Date Taking? Authorizing Provider  acetaminophen (TYLENOL) 325 MG tablet Take 650 mg by mouth every 8 (eight) hours as needed for mild pain, moderate pain or headache.     [provider]  albuterol (PROVENTIL) (2.5 MG/3ML) 0.083% nebulizer solution Take 2.5 mg by nebulization every 6 (six) hours as needed for wheezing or shortness of breath.    [provider]  albuterol (VENTOLIN HFA) 108 (90 Base) MCG/ACT inhaler Inhale 2 puffs into the lungs every 6 (six) hours as needed for wheezing or shortness of breath.  07/10/19   [provider]  amantadine (SYMMETREL) 100 MG capsule Take 100 mg by mouth daily.     [provider]  ascorbic acid (VITAMIN C) 500 MG tablet Take 500 mg by mouth daily.      [provider]  aspirin 325 MG EC tablet Take 325 mg by mouth daily.    [provider]  Baclofen 5 MG TABS Take 5 mg by mouth 4 (four) times daily. Hold if lethargic or confused  12/04/17   [provider]  buPROPion (WELLBUTRIN SR) 150 MG 12 hr tablet Take 150 mg by mouth daily. 01/23/20   [provider]  Cranberry 400 MG TABS Take 400 mg by mouth daily.     [provider]  diphenhydrAMINE (BENADRYL) 2 % cream Apply 1 application topically every 12 (twelve) hours as needed for itching.    [provider]  diphenhydrAMINE (BENADRYL) 25 mg capsule Take 25 mg by mouth every 6 (six) hours as needed for itching or allergies (sob).     [provider]  DULoxetine (CYMBALTA) 60 MG capsule Take 60 mg by mouth daily. 03/29/20   [provider]  Ensure (ENSURE) Take 237 mLs by mouth 2 (two) times daily between meals.    [provider]  famotidine (PEPCID) 20 MG tablet Take 1 tablet (20 mg total) by mouth 2 (two) times daily. 07/25/15   Rai, Vernelle Emerald, MD  ferrous sulfate 325 (65 FE) MG tablet Take 325 mg by mouth 2 (two) times daily with a meal.  02/20/18   [provider]  fluticasone (FLONASE) 50 MCG/ACT nasal spray Place 2 sprays into both nostrils every evening.    [provider]  gabapentin (NEURONTIN) 800 MG tablet Take 800 mg by mouth 3 (three) times daily.    [provider]  hydrocortisone cream 1 % Apply 1 application topically every 12 (twelve) hours as needed for itching.    [provider]  insulin aspart (NOVOLOG FLEXPEN) 100 UNIT/ML FlexPen 0-15 Units, Subcutaneous, 3 times daily with meals CBG < 70: implement hypoglycemia protocol-call MD CBG 70 - 120: 0 units CBG 121 - 150: 2 units CBG 151 - 200: 3 units CBG 201 - 250: 5 units CBG 251 - 300: 8 units CBG 301 - 350: 11 units CBG 351 - 400: 15 units CBG > 400: Patient taking differently: Inject 0-15 Units into the skin  3 (three) times daily with meals. 0-15 Units, Subcutaneous, 3 times daily with meals CBG < 70: implement hypoglycemia protocol-call MD CBG 0- 120: 0 units CBG 121 - 150: 2 units CBG 151 - 200: 3 units CBG 201 - 250: 5 units CBG 251 - 300: 8 units CBG 301 - 400: 15 units CBG >400, CALL MD 05/18/19   Jonetta Osgood, MD  insulin glargine (LANTUS) 100 UNIT/ML injection Inject 0.1 mLs (10 Units total) into the skin daily. Patient taking differently: Inject 10 Units into the skin at bedtime.  05/19/19   Ghimire, Henreitta Leber, MD  LANTUS SOLOSTAR 100 UNIT/ML Solostar Pen Inject 10 Units into the skin at bedtime.  04/23/20   [provider]  latanoprost (XALATAN) 0.005 % ophthalmic solution Place 1 drop into both eyes at bedtime.    [provider]  lubiprostone (AMITIZA) 24 MCG capsule Take 24 mcg by mouth 2 (two) times daily with a meal.    [provider]  Melatonin 5 MG TABS Take 10 mg by mouth at bedtime.  11/14/17   [provider]  metoprolol succinate (TOPROL-XL) 100 MG 24 hr tablet Take 150 mg by mouth daily. Take with or immediately following a meal.    [provider]  Multiple Vitamins-Minerals (MULTIVITAMIN ADULT PO) Take 1 tablet by mouth daily.    [provider]  omeprazole (PRILOSEC) 20 MG capsule Take 20 mg by mouth daily.    [provider]  ondansetron (ZOFRAN) 4 MG tablet Take 4 mg by mouth every 6 (six) hours as needed for nausea or vomiting. 11/01/17   [provider]  polyethylene glycol (MIRALAX MIX-IN PAX) 17 g packet Take 17 g by mouth daily. 05/21/20   Donne Hazel, MD  Potassium Chloride ER 20 MEQ TBCR Take 1 tablet by mouth daily. 05/17/20   [provider]  Probiotic Product (PROBIOTIC PO) Take 1 capsule by mouth daily.    [provider]  senna-docusate (SENOKOT S) 8.6-50 MG tablet Take 2 tablets by mouth 2 (two) times daily.     [provider]  simethicone (MYLICON) 0000000  MG chewable tablet Chew 125 mg by mouth every 6 (six) hours as needed for flatulence.    [provider]  spironolactone (ALDACTONE) 25 MG tablet Take 25 mg by mouth daily. 05/02/20   [provider]  tamsulosin (FLOMAX) 0.4 MG CAPS capsule Take 0.4 mg by mouth daily. For incontinence    [provider]  traMADol (ULTRAM) 50 MG tablet Take 100 mg by mouth every 6 (six) hours as needed (Pain).    [provider]    Family History  Family History  Problem Relation Age of Onset  . Hypertension Mother   . Cancer Mother        breast   . Cancer Father        prostate  . Multiple sclerosis Sister   . Heart attack Neg Hx    She indicated that her mother is alive. She indicated that her father is deceased. She indicated that the status of her sister is unknown. She indicated that the status of her neg hx is unknown.  Social History    Social History   Socioeconomic History  . Marital status: Single    Spouse name: Not on file  . Number of children: Not on file  . Years of education: Not on file  . Highest education level: Not on file  Occupational History  . Not on file  Tobacco Use  . Smoking status: Former Smoker    Packs/day: 0.50    Years: 5.00    Pack years: 2.50  . Smokeless tobacco: Never Used  Vaping Use  . Vaping Use: Never used  Substance and Sexual Activity  . Alcohol use: No  . Drug use: No  . Sexual activity: Not Currently  Other Topics Concern  . Not on file  Social History Narrative  . Not on file   Social Determinants of Health   Financial Resource Strain: Not on file  Food Insecurity: Not on file  Transportation Needs: Not on file  Physical Activity: Not on file  Stress: Not on file  Social Connections: Not on file  Intimate Partner Violence: Not on file     Review of Systems    General:  No chills, fever, night sweats or weight changes.  Cardiovascular:  No chest pain, dyspnea on exertion, edema, orthopnea,  palpitations, paroxysmal nocturnal dyspnea. Dermatological: No rash, lesions/masses Respiratory: No cough, dyspnea Urologic: No hematuria, dysuria Abdominal:   No nausea, vomiting, diarrhea, bright red blood per rectum, melena, or hematemesis Neurologic:  No visual changes, wkns, changes in mental status. All other systems reviewed and are otherwise negative except as noted above.  Physical Exam    VS:  BP 110/80 (BP Location: Right Arm, Patient Position: Sitting, Cuff Size: Large)   Pulse 74   Ht 5\' 5"  (1.651 m)   SpO2 97%   BMI 30.45 kg/m  , BMI Body mass index is 30.45 kg/m. GEN: Well nourished, well developed, in no acute distress. HEENT: normal. Neck: Supple, no JVD, carotid bruits, or masses. Cardiac: RRR, no murmurs, rubs, or gallops. No clubbing, cyanosis, edema.  Radials/DP/PT 2+ and equal bilaterally.  Respiratory:  Respirations regular and unlabored, clear to auscultation bilaterally. GI: Soft, nontender, nondistended, BS + x 4. MS: no deformity or atrophy. Skin: warm and dry, no rash. Neuro:  Strength and sensation are intact. Psych: Normal affect.  Accessory Clinical Findings    Recent Labs: 05/20/2020: Hemoglobin 12.1; Hemoglobin 12.2; Platelets 301; Platelets 300 05/21/2020: ALT 35; BUN 7; Creatinine, Ser 0.83; Magnesium 2.3; Potassium 4.2; Sodium 146   Recent Lipid Panel    Component Value Date/Time   CHOL 183 12/10/2017 0000   TRIG 273 (H) 05/10/2019 1452   HDL 31 (A) 12/10/2017 0000   CHOLHDL 6.3 05/01/2012 0500   VLDL 14 05/01/2012 0500   LDLCALC 98 12/10/2017 0000    ECG personally reviewed by me today-normal sinus rhythm moderate voltage criteria for LVH nonspecific T wave abnormality prolonged QT 75 bpm- No acute changes  Echocardiogram 05/20/2020 IMPRESSIONS    1.  Left ventricular ejection fraction, by estimation, is 35 to 40%. The  left ventricle has moderately decreased function. The left ventricle  demonstrates global hypokinesis.  Indeterminate diastolic filling due to  E-A fusion.  2. Right ventricular systolic function is normal. The right ventricular  size is normal. Tricuspid regurgitation signal is inadequate for assessing  PA pressure.  3. The mitral valve is grossly normal. Trivial mitral valve  regurgitation. No evidence of mitral stenosis.  4. The aortic valve is tricuspid. Aortic valve regurgitation is mild. No  aortic stenosis is present.  5. The inferior vena cava is normal in size with greater than 50%  respiratory variability, suggesting right atrial pressure of 3 mmHg.   Comparison(s): Changes from prior study are noted. EF roughly the same,  but LBBB makes it appear to be 35-40%. Mild AI remains. Pulmonary  pressures have improved.   Cardiac catheterization 10/29/2019  There is mild left ventricular systolic dysfunction.  The left ventricular ejection fraction is 45-50% by visual estimate.  There is no aortic valve stenosis.  Angiographically normal coronary arteries    Angiographically normal coronary arteries with a codominant system but the RCA is relatively diminutive.  Nonischemic Cardiomyopathy with mildly reduced EF of roughly 45%, global hypokinesis.  Minimally elevated LVEDP    [name], MD Diagnostic Dominance: Co-dominant    Intervention    Assessment & Plan   1.  Chronic combined systolic and diastolic CHF-euvolemic today.  No increased DOE or activity intolerance.  Remains sedentary.  Echocardiogram 05/20/2020 showed LVEF 35-40% and intermediate diastolic filling pressures.  Cardiac catheterization 4/21 showed EF 45-50% and normal coronary arteries. Continue spironolactone, metoprolol Heart healthy low-sodium diet-salty 6 given Increase physical activity as tolerated  Essential hypertension-BP today 110/80.  Well-controlled at home. Continue metoprolol, spironolactone Heart healthy low-sodium diet-salty 6 given Increase physical activity as  tolerated   Preoperative cardiac evaluation-suprapubic catheter placement, Dr. Dwana Melena   Primary Cardiologist: Peter Martinique, MD  Chart reviewed as part of pre-operative protocol coverage. Given past medical history and time since last visit, based on ACC/AHA guidelines, Shannon Garrett would be at acceptable risk for the planned procedure without further cardiovascular testing.   Her RCRI is a class IV risk, 11% risk of major cardiac event.  Patient was advised that if she develops new symptoms prior to surgery to contact our office to arrange a follow-up appointment.  He verbalized understanding.  I will route this recommendation to the requesting party via Epic fax function and remove from pre-op pool.  Please call with questions.   Disposition: Follow-up with Dr. Martinique in 6 months.   Jossie Ng. Colleen Donahoe NP-C    11/03/2020, 11:40 AM [place] [address] Office [phone] Fax [phone]  Notice: This dictation was prepared with Dragon dictation along with smaller phrase technology. Any transcriptional errors that result from this process are unintentional and may not be corrected upon review.  I spent 14 minutes examining this patient, reviewing medications, and using patient centered shared decision making involving her cardiac care.  Prior to her visit I spent greater than 20 minutes reviewing her past medical history,  medications, and prior cardiac tests.

## 2020-11-03 ENCOUNTER — Other Ambulatory Visit: Payer: Self-pay

## 2020-11-03 ENCOUNTER — Ambulatory Visit (INDEPENDENT_AMBULATORY_CARE_PROVIDER_SITE_OTHER): Payer: 59 | Admitting: General Practice

## 2020-11-03 ENCOUNTER — Encounter: Payer: Self-pay | Admitting: General Practice

## 2020-11-03 VITALS — BP 110/80 | HR 74 | Ht 65.0 in

## 2020-11-03 DIAGNOSIS — I5042 Chronic combined systolic (congestive) and diastolic (congestive) heart failure: Secondary | ICD-10-CM

## 2020-11-03 DIAGNOSIS — I10 Essential (primary) hypertension: Secondary | ICD-10-CM

## 2020-11-03 DIAGNOSIS — Z01810 Encounter for preprocedural cardiovascular examination: Secondary | ICD-10-CM | POA: Diagnosis not present

## 2020-11-03 NOTE — Patient Instructions (Signed)
Medication Instructions:  The current medical regimen is effective;  continue present plan and medications as directed. Please refer to the Current Medication list given to you today.  *If you need a refill on your cardiac medications before your next appointment, please call your pharmacy*  Lab Work:   Testing/Procedures:  NONE    NONE  Special Instructions CLEARED FOR SURGERY  Follow-Up: Your next appointment:  6 month(s) In Person with Peter Martinique, MD ONLY  Please call our office 2 months in advance to schedule this appointment   At [facility], you and your health needs are our priority.  As part of our continuing mission to provide you with exceptional heart care, we have created designated Provider Care Teams.  These Care Teams include your primary Cardiologist (physician) and Advanced Practice Providers (APPs -  Physician Assistants and Nurse Practitioners) who all work together to provide you with the care you need, when you need it.  We recommend signing up for the patient portal called "MyChart".  Sign up information is provided on this After Visit Summary.  MyChart is used to connect with patients for Virtual Visits (Telemedicine).  Patients are able to view lab/test results, encounter notes, upcoming appointments, etc.  Non-urgent messages can be sent to your provider as well.   To learn more about what you can do with MyChart, go to [email or web address].

## 2021-03-19 ENCOUNTER — Emergency Department (HOSPITAL_BASED_OUTPATIENT_CLINIC_OR_DEPARTMENT_OTHER)
Admission: EM | Admit: 2021-03-19 | Discharge: 2021-03-20 | Disposition: A | Discharge status: Home / Self Care | Payer: 59 | Attending: Emergency Medicine | Admitting: Emergency Medicine

## 2021-03-19 ENCOUNTER — Other Ambulatory Visit: Payer: Self-pay

## 2021-03-19 ENCOUNTER — Encounter (HOSPITAL_BASED_OUTPATIENT_CLINIC_OR_DEPARTMENT_OTHER): Payer: Self-pay

## 2021-03-19 DIAGNOSIS — E109 Type 1 diabetes mellitus without complications: Secondary | ICD-10-CM | POA: Insufficient documentation

## 2021-03-19 DIAGNOSIS — Z7982 Long term (current) use of aspirin: Secondary | ICD-10-CM | POA: Diagnosis not present

## 2021-03-19 DIAGNOSIS — Z79899 Other long term (current) drug therapy: Secondary | ICD-10-CM | POA: Insufficient documentation

## 2021-03-19 DIAGNOSIS — I110 Hypertensive heart disease with heart failure: Secondary | ICD-10-CM | POA: Diagnosis not present

## 2021-03-19 DIAGNOSIS — Y846 Urinary catheterization as the cause of abnormal reaction of the patient, or of later complication, without mention of misadventure at the time of the procedure: Secondary | ICD-10-CM | POA: Insufficient documentation

## 2021-03-19 DIAGNOSIS — I5042 Chronic combined systolic (congestive) and diastolic (congestive) heart failure: Secondary | ICD-10-CM | POA: Diagnosis not present

## 2021-03-19 DIAGNOSIS — T83510A Infection and inflammatory reaction due to cystostomy catheter, initial encounter: Secondary | ICD-10-CM | POA: Diagnosis present

## 2021-03-19 DIAGNOSIS — Z87891 Personal history of nicotine dependence: Secondary | ICD-10-CM | POA: Diagnosis not present

## 2021-03-19 DIAGNOSIS — Z794 Long term (current) use of insulin: Secondary | ICD-10-CM | POA: Diagnosis not present

## 2021-03-19 DIAGNOSIS — N390 Urinary tract infection, site not specified: Secondary | ICD-10-CM

## 2021-03-19 DIAGNOSIS — Z8616 Personal history of COVID-19: Secondary | ICD-10-CM | POA: Diagnosis not present

## 2021-03-19 LAB — COMPREHENSIVE METABOLIC PANEL
ALT: 24 U/L (ref 0–44)
AST: 20 U/L (ref 15–41)
Albumin: 3.6 g/dL (ref 3.5–5.0)
Alkaline Phosphatase: 192 U/L — ABNORMAL HIGH (ref 38–126)
Anion gap: 8 (ref 5–15)
BUN: 16 mg/dL (ref 6–20)
CO2: 26 mmol/L (ref 22–32)
Calcium: 8.9 mg/dL (ref 8.9–10.3)
Chloride: 107 mmol/L (ref 98–111)
Creatinine, Ser: 0.87 mg/dL (ref 0.44–1.00)
GFR, Estimated: >60 mL/min (ref >60)
Glucose, Bld: 140 mg/dL — ABNORMAL HIGH (ref 70–99)
Potassium: 4 mmol/L (ref 3.5–5.1)
Sodium: 141 mmol/L (ref 135–145)
Total Bilirubin: 0.2 mg/dL — ABNORMAL LOW (ref 0.3–1.2)
Total Protein: 6.3 g/dL — ABNORMAL LOW (ref 6.5–8.1)

## 2021-03-19 LAB — URINALYSIS, ROUTINE W REFLEX MICROSCOPIC
Bilirubin Urine: NEGATIVE
Glucose, UA: NEGATIVE mg/dL
Hgb urine dipstick: NEGATIVE
Ketones, ur: NEGATIVE mg/dL
Nitrite: NEGATIVE
Protein, ur: 100 mg/dL — AB
Specific Gravity, Urine: <1.005 — ABNORMAL LOW (ref 1.005–1.030)
pH: >9 — ABNORMAL HIGH (ref 5.0–8.0)

## 2021-03-19 LAB — URINALYSIS, MICROSCOPIC (REFLEX)

## 2021-03-19 LAB — CBC WITH DIFFERENTIAL/PLATELET
Abs Immature Granulocytes: 0.01 10*3/uL (ref 0.00–0.07)
Basophils Absolute: 0.1 10*3/uL (ref 0.0–0.1)
Basophils Relative: 2 %
Eosinophils Absolute: 0.6 10*3/uL — ABNORMAL HIGH (ref 0.0–0.5)
Eosinophils Relative: 9 %
HCT: 34.9 % — ABNORMAL LOW (ref 36.0–46.0)
Hemoglobin: 11.3 g/dL — ABNORMAL LOW (ref 12.0–15.0)
Immature Granulocytes: 0 %
Lymphocytes Relative: 27 %
Lymphs Abs: 1.8 10*3/uL (ref 0.7–4.0)
MCH: 28.4 pg (ref 26.0–34.0)
MCHC: 32.4 g/dL (ref 30.0–36.0)
MCV: 87.7 fL (ref 80.0–100.0)
Monocytes Absolute: 0.5 10*3/uL (ref 0.1–1.0)
Monocytes Relative: 8 %
Neutro Abs: 3.5 10*3/uL (ref 1.7–7.7)
Neutrophils Relative %: 54 %
Platelets: 223 10*3/uL (ref 150–400)
RBC: 3.98 MIL/uL (ref 3.87–5.11)
RDW: 14.3 % (ref 11.5–15.5)
WBC: 6.5 10*3/uL (ref 4.0–10.5)
nRBC: 0 % (ref 0.0–0.2)

## 2021-03-19 LAB — LIPASE, BLOOD: Lipase: 17 U/L (ref 11–51)

## 2021-03-19 MED ORDER — CEPHALEXIN 500 MG PO CAPS: 500.0000 mg | ORAL_CAPSULE | Freq: Four times a day (QID) | ORAL | 0 refills | Status: DC

## 2021-03-19 MED ORDER — SODIUM CHLORIDE 0.9 % IV SOLN
1.0000 g | Freq: Once | INTRAVENOUS | Status: AC
  Administered 2021-03-19: 1 g via INTRAVENOUS
  Filled 2021-03-19: qty 10

## 2021-03-19 MED ORDER — ACETAMINOPHEN 500 MG PO TABS
1000.0000 mg | ORAL_TABLET | Freq: Once | ORAL | Status: AC
  Administered 2021-03-19: 1000 mg via ORAL
  Filled 2021-03-19: qty 2

## 2021-03-19 NOTE — ED Triage Notes (Signed)
From skilled nursing facility with urinary ostomy catheter that is not draining

## 2021-03-19 NOTE — ED Notes (Signed)
Attempt to call report.

## 2021-03-19 NOTE — Discharge Instructions (Addendum)
Your symptom is likely due to underlying urinary tract infection.  Please take antibiotic as prescribed.  Follow-up with your urologist for further care.

## 2021-03-19 NOTE — ED Provider Notes (Signed)
[facility] EMERGENCY DEPT Provider Note   CSN: [id number] Arrival date & time: 03/19/21  1351     History Chief Complaint  Patient presents with   Urinary ostomy malfunction    Shannon Garrett is a 60 y.o. female.  The history is provided by the patient and medical records. No language interpreter was used.   60 year old female with multiple comorbidities including MS, CHF, paraplegia, hypertension, chronic pain, presenting due to concerns of her urostomy.  Patient states she had a suprapubic catheter replaced 2 weeks ago by urologist.  Since then she has had pain to the site as well as having some malodorous smell.  Abdominal pain is achy, moderate in severity, persistent.  No fever or chills no runny nose sneezing or coughing no nausea vomiting or diarrhea.  She believes she may need to have her suprapubic Foley catheter exchange and she worries for current infection.  No specific treatment tried.  Past Medical History:  Diagnosis Date   Cardiomyopathy [facility])    a.  Echo 04/29/12: Mild LVH, EF 20-25%, mild AI, moderate MR, moderate LAE, mild RAE, mild RVE, moderate TR, PASP 51, small pericardial effusion;   b. probably non-ischemic given multiple chemo-Tx agents used for MS and global LV dysfn on echo   Chronic systolic heart failure ([place])    COVID-19 05/10/2019   Per facility paperwork   Decubitus ulcer    stage II sacral decubitus ulcer.    Depression    Glaucoma    Hypertension    MS (multiple sclerosis) ([place])    a. Dx'd late 20's. b. Tx with Novantrone, Tysabri, Copaxone previously.    Patient Active Problem List   Diagnosis Date Noted   SVT (supraventricular tachycardia) ([place]) 05/19/2020   Cholangitis from CBD obstruction s/p ERCP 02/15/2020 02/16/2020   Choledocholithiasis s/p ERCP 02/15/2020 02/16/2020   Cholecystitis 02/12/2020   Abnormal nuclear stress test    Preoperative cardiovascular examination    Acute calculous cholecystitis 07/19/2019    Insulin dependent diabetes mellitus type IA ([place]) 07/19/2019   History of Clostridioides difficile colitis 07/19/2019   Multiple drug resistant organism (MDRO) Klebsiella pneumonia UTI culture positive 07/19/2019   Enteritis due to Clostridium difficile 06/24/2019   COVID-19 virus infection 06/24/2019   Palliative care by specialist    DNR (do not resuscitate)    Sepsis ([place]) 05/10/2019   History of COVID-19 Nov2020 05/10/2019   Dysesthesia 08/26/2018   Dyslipidemia 06/13/2017   Elevated alkaline phosphatase level 06/13/2017   Acute frontal sinusitis 05/24/2017   Influenza 04/19/2017   Glaucoma 04/06/2017   Allergic rhinitis due to allergen 03/07/2017   Vaginal candidiasis 03/01/2017   UTI (urinary tract infection) 01/02/2017   High risk medication use 01/25/2016   Neuropathy 11/13/2015   GERD (gastroesophageal reflux disease) 11/13/2015   Dysphagia as late effect of stroke 11/08/2015   Adrenal insufficiency (Addison's disease) ([employer]) 08/12/2015   Vitamin D deficiency 06/18/2015   Hypokalemia 06/03/2015   Chronic anemia 05/30/2015   Chronic pain syndrome 03/03/2015   Essential hypertension    SIRS (systemic inflammatory response syndrome) ([place]) 01/11/2015   Dilated cardiomyopathy ([place])    Chronic constipation 11/26/2014   Depression 11/26/2014   Adult failure to thrive 07/31/2013   Insomnia 06/03/2013   Neurogenic bladder 06/03/2013   Paraplegia ([place]) 09/05/2012   Chronic diastolic CHF (congestive heart failure) ([place]) 05/22/2012   Obesity 11/30/2008   MS (multiple sclerosis) ([place]) 11/30/2008   Hypertensive heart disease with CHF (congestive heart failure) ([place]) 11/30/2008  Past Surgical History:  Procedure Laterality Date   ABLATION     uterine   BILIARY DILATION  02/15/2020   Procedure: BILIARY DILATION;  Surgeon: Rush Landmark Telford Nab., MD;  Location: [facility];  Service: Gastroenterology;;   CESAREAN SECTION     CHOLECYSTECTOMY N/A 07/20/2019   Procedure:  DIAGNOSTIC LAPAROSCOPY;  Surgeon: [name], MD;  Location: [facility];  Service: General;  Laterality: N/A;   CHOLECYSTECTOMY N/A 02/12/2020   Procedure: LAPAROSCOPIC CHOLECYSTECTOMY;  Surgeon: Clovis Riley, MD;  Location: [facility];  Service: General;  Laterality: N/A;   ERCP N/A 02/15/2020   Procedure: ENDOSCOPIC RETROGRADE CHOLANGIOPANCREATOGRAPHY (ERCP);  Surgeon: Irving Copas., MD;  Location: [facility];  Service: Gastroenterology;  Laterality: N/A;   IR EXCHANGE BILIARY DRAIN  09/07/2019   IR PERC CHOLECYSTOSTOMY  07/21/2019   LEFT HEART CATH AND CORONARY ANGIOGRAPHY N/A 10/29/2019   Procedure: LEFT HEART CATH AND CORONARY ANGIOGRAPHY;  Surgeon: Leonie Man, MD;  Location: [facility];  Service: Cardiovascular;  Laterality: N/A;   REMOVAL OF STONES  02/15/2020   Procedure: REMOVAL OF STONES;  Surgeon: Rush Landmark Telford Nab., MD;  Location: [facility];  Service: Gastroenterology;;   Joan Mayans  02/15/2020   Procedure: Joan Mayans;  Surgeon: Rush Landmark Telford Nab., MD;  Location: [facility];  Service: Gastroenterology;;     OB History   No obstetric history on file.     Family History  Problem Relation Age of Onset   Hypertension Mother    Cancer Mother        breast    Cancer Father        prostate   Multiple sclerosis Sister    Heart attack Neg Hx     Social History   Tobacco Use   Smoking status: Former    Packs/day: 0.50    Years: 5.00    Pack years: 2.50    Types: Cigarettes   Smokeless tobacco: Never  Vaping Use   Vaping Use: Never used  Substance Use Topics   Alcohol use: No   Drug use: No    Home Medications Prior to Admission medications   Medication Sig Start Date End Date Taking? Authorizing Provider  acetaminophen (TYLENOL) 325 MG tablet Take 650 mg by mouth every 8 (eight) hours as needed for mild pain, moderate pain or headache.     [provider]  albuterol (PROVENTIL) (2.5 MG/3ML) 0.083% nebulizer solution  Take 2.5 mg by nebulization every 6 (six) hours as needed for wheezing or shortness of breath.    [provider]  albuterol (VENTOLIN HFA) 108 (90 Base) MCG/ACT inhaler Inhale 2 puffs into the lungs every 6 (six) hours as needed for wheezing or shortness of breath.  07/10/19   [provider]  amantadine (SYMMETREL) 100 MG capsule Take 100 mg by mouth daily.     [provider]  ascorbic acid (VITAMIN C) 500 MG tablet Take 500 mg by mouth daily.     [provider]  aspirin 325 MG EC tablet Take 325 mg by mouth daily.    [provider]  Baclofen 5 MG TABS Take 5 mg by mouth 4 (four) times daily. Hold if lethargic or confused 12/04/17   [provider]  buPROPion (WELLBUTRIN SR) 150 MG 12 hr tablet Take 150 mg by mouth daily. 01/23/20   [provider]  Cranberry 400 MG TABS Take 400 mg by mouth daily.     [provider]  diphenhydrAMINE (BENADRYL) 2 %  cream Apply 1 application topically every 12 (twelve) hours as needed for itching.    [provider]  diphenhydrAMINE (BENADRYL) 25 mg capsule Take 25 mg by mouth every 6 (six) hours as needed for itching or allergies (sob).     [provider]  DULoxetine (CYMBALTA) 60 MG capsule Take 60 mg by mouth daily. 03/29/20   [provider]  Ensure (ENSURE) Take 237 mLs by mouth 2 (two) times daily between meals.    [provider]  famotidine (PEPCID) 20 MG tablet Take 1 tablet (20 mg total) by mouth 2 (two) times daily. 07/25/15   Rai, Vernelle Emerald, MD  ferrous sulfate 325 (65 FE) MG tablet Take 325 mg by mouth 2 (two) times daily with a meal.  02/20/18   [provider]  fluticasone (FLONASE) 50 MCG/ACT nasal spray Place 2 sprays into both nostrils every evening.    [provider]  gabapentin (NEURONTIN) 800 MG tablet Take 800 mg by mouth 3 (three) times daily.    [provider]  hydrocortisone cream 1 % Apply 1 application  topically every 12 (twelve) hours as needed for itching.    [provider]  insulin aspart (NOVOLOG FLEXPEN) 100 UNIT/ML FlexPen 0-15 Units, Subcutaneous, 3 times daily with meals CBG < 70: implement hypoglycemia protocol-call MD CBG 70 - 120: 0 units CBG 121 - 150: 2 units CBG 151 - 200: 3 units CBG 201 - 250: 5 units CBG 251 - 300: 8 units CBG 301 - 350: 11 units CBG 351 - 400: 15 units CBG > 400: Patient taking differently: Inject 0-15 Units into the skin 3 (three) times daily with meals. 0-15 Units, Subcutaneous, 3 times daily with meals CBG < 70: implement hypoglycemia protocol-call MD CBG 0- 120: 0 units CBG 121 - 150: 2 units CBG 151 - 200: 3 units CBG 201 - 250: 5 units CBG 251 - 300: 8 units CBG 301 - 400: 15 units CBG >400, CALL MD 05/18/19   Jonetta Osgood, MD  insulin glargine (LANTUS) 100 UNIT/ML injection Inject 0.1 mLs (10 Units total) into the skin daily. Patient not taking: Reported on 11/03/2020 05/19/19   Jonetta Osgood, MD  LANTUS SOLOSTAR 100 UNIT/ML Solostar Pen Inject 10 Units into the skin at bedtime.  Patient not taking: Reported on 11/03/2020 04/23/20   [provider]  latanoprost (XALATAN) 0.005 % ophthalmic solution Place 1 drop into both eyes at bedtime.    [provider]  linaclotide (LINZESS) 290 MCG CAPS capsule Take 290 mcg by mouth daily before breakfast.    [provider]  lubiprostone (AMITIZA) 24 MCG capsule Take 24 mcg by mouth 2 (two) times daily with a meal. Patient not taking: Reported on 11/03/2020    [provider]  Melatonin 5 MG TABS Take 10 mg by mouth at bedtime.  11/14/17   [provider]  metoprolol succinate (TOPROL-XL) 100 MG 24 hr tablet Take 150 mg by mouth daily. Take with or immediately following a meal.    [provider]  Multiple Vitamins-Minerals (MULTIVITAMIN ADULT PO) Take 1 tablet by mouth daily.    [provider]  omeprazole (PRILOSEC) 20 MG  capsule Take 20 mg by mouth daily.    [provider]  ondansetron (ZOFRAN) 4 MG tablet Take 4 mg by mouth every 6 (six) hours as needed for nausea or vomiting. Patient not taking: Reported on 11/03/2020 11/01/17   [provider]  polyethylene glycol (MIRALAX MIX-IN  PAX) 17 g packet Take 17 g by mouth daily. 05/21/20   Donne Hazel, MD  Potassium Chloride ER 20 MEQ TBCR Take 1 tablet by mouth daily. 05/17/20   [provider]  Probiotic Product (PROBIOTIC PO) Take 1 capsule by mouth daily. Patient not taking: Reported on 11/03/2020    [provider]  senna-docusate (SENOKOT-S) 8.6-50 MG tablet Take 2 tablets by mouth 2 (two) times daily.  Patient not taking: Reported on 11/03/2020    [provider]  simethicone (MYLICON) 0000000 MG chewable tablet Chew 125 mg by mouth every 6 (six) hours as needed for flatulence. Patient not taking: Reported on 11/03/2020    [provider]  spironolactone (ALDACTONE) 25 MG tablet Take 25 mg by mouth daily. 05/02/20   [provider]  tamsulosin (FLOMAX) 0.4 MG CAPS capsule Take 0.4 mg by mouth daily. For incontinence    [provider]  traMADol (ULTRAM) 50 MG tablet Take 100 mg by mouth every 6 (six) hours as needed (Pain). Patient not taking: Reported on 11/03/2020    [provider]    Allergies    Sulfonamide derivatives and Penicillins  Review of Systems   Review of Systems  All other systems reviewed and are negative.  Physical Exam Updated Vital Signs There were no vitals taken for this visit.  Physical Exam Vitals and nursing note reviewed.  Constitutional:      General: She is not in acute distress.    Appearance: She is well-developed. She is obese.     Comments: He is morbidly obese, laying in bed appears to be in no acute discomfort.  HENT:     Head: Atraumatic.  Eyes:     Conjunctiva/sclera: Conjunctivae normal.  Cardiovascular:     Rate and Rhythm: Normal rate  and regular rhythm.     Pulses: Normal pulses.     Heart sounds: Normal heart sounds.  Pulmonary:     Effort: Pulmonary effort is normal.  Abdominal:     Palpations: Abdomen is soft.     Tenderness: There is no abdominal tenderness.  Musculoskeletal:     Cervical back: Neck supple.  Skin:    Findings: No rash.     Comments: Suprapubic catheter in place with mild surrounding maceration of the skin but no erythema or purulent discharge.  It is mildly tender to palpation.  Neurological:     Mental Status: She is alert. Mental status is at baseline.  Psychiatric:        Mood and Affect: Mood normal.    ED Results / Procedures / Treatments   Labs (all labs ordered are listed, but only abnormal results are displayed) Labs Reviewed  CBC WITH DIFFERENTIAL/PLATELET - Abnormal; Notable for the following components:      Result Value   Hemoglobin 11.3 (*)    HCT 34.9 (*)    Eosinophils Absolute 0.6 (*)    All other components within normal limits  COMPREHENSIVE METABOLIC PANEL - Abnormal; Notable for the following components:   Glucose, Bld 140 (*)    Total Protein 6.3 (*)    Alkaline Phosphatase 192 (*)    Total Bilirubin 0.2 (*)    All other components within normal limits  URINALYSIS, ROUTINE W REFLEX MICROSCOPIC - Abnormal; Notable for the following components:   Specific Gravity, Urine <1.005 (*)    pH >9.0 (*)    Protein, ur 100 (*)    Leukocytes,Ua LARGE (*)    All other components within  normal limits  URINALYSIS, MICROSCOPIC (REFLEX) - Abnormal; Notable for the following components:   Bacteria, UA MANY (*)    All other components within normal limits  URINE CULTURE  LIPASE, BLOOD    EKG None  Radiology No results found.  Procedures Procedures   Medications Ordered in ED Medications  acetaminophen (TYLENOL) tablet 1,000 mg (1,000 mg Oral Given 03/19/21 1447)  cefTRIAXone (ROCEPHIN) 1 g in sodium chloride 0.9 % 100 mL IVPB (1 g Intravenous New Bag/Given 03/19/21  1647)    ED Course  I have reviewed the triage vital signs and the nursing notes.  Pertinent labs & imaging results that were available during my care of the patient were reviewed by me and considered in my medical decision making (see chart for details).    MDM Rules/Calculators/A&P                           BP (!) 125/59 (BP Location: Right Arm)   Pulse 68   Temp 98.6 F (37 C) (Oral)   Resp 16   Ht '5\' 2"'$  (1.575 m)   Wt 83.9 kg   SpO2 99%   BMI 33.84 kg/m   Final Clinical Impression(s) / ED Diagnoses Final diagnoses:  Urinary tract infection associated with cystostomy catheter, initial encounter ([place])    Rx / DC Orders ED Discharge Orders          Ordered    cephALEXin (KEFLEX) 500 MG capsule  4 times daily        03/19/21 1740           Patient voiced concern for potential UTI because she noticed malodorous urine as well as pain to her suprapubic catheter site.  Patient overall at her baseline.  Labs obtained and are reassuring.  UA shows large leukocyte Estrace with many bacteria.  Since patient complaining of urinary discomfort and malodorous, will treat for UTI.  Her Foley catheter is free-flowing and no evidence of Foley obstruction.   Domenic Moras, PA-C 03/19/21 1742    Pattricia Boss, MD 03/20/21 [phone]

## 2021-03-19 NOTE — ED Notes (Signed)
Attempt to call report  No answer on unit.

## 2021-03-19 NOTE — ED Notes (Signed)
Suprapubic catheter cleansed around site with normal saline then dried.  4x4 drain sponges applied around site.  Adult diaper changed for large amount of urine.

## 2021-03-19 NOTE — ED Notes (Signed)
Attempt to call report to [facility].  No answer

## 2021-03-20 DIAGNOSIS — T83510A Infection and inflammatory reaction due to cystostomy catheter, initial encounter: Secondary | ICD-10-CM | POA: Diagnosis not present

## 2021-03-20 LAB — URINE CULTURE

## 2021-03-20 MED ORDER — TRAMADOL HCL 50 MG PO TABS
50.0000 mg | ORAL_TABLET | Freq: Once | ORAL | Status: AC
  Administered 2021-03-20: 50 mg via ORAL
  Filled 2021-03-20: qty 1

## 2021-05-06 ENCOUNTER — Emergency Department (HOSPITAL_COMMUNITY)
Admission: EM | Admit: 2021-05-06 | Discharge: 2021-05-07 | Disposition: A | Discharge status: Home / Self Care | Payer: 59 | Attending: Emergency Medicine | Admitting: Emergency Medicine

## 2021-05-06 ENCOUNTER — Other Ambulatory Visit: Payer: Self-pay

## 2021-05-06 ENCOUNTER — Encounter (HOSPITAL_COMMUNITY): Payer: Self-pay

## 2021-05-06 DIAGNOSIS — N390 Urinary tract infection, site not specified: Secondary | ICD-10-CM | POA: Diagnosis present

## 2021-05-06 DIAGNOSIS — E119 Type 2 diabetes mellitus without complications: Secondary | ICD-10-CM | POA: Diagnosis not present

## 2021-05-06 DIAGNOSIS — I5042 Chronic combined systolic (congestive) and diastolic (congestive) heart failure: Secondary | ICD-10-CM | POA: Diagnosis not present

## 2021-05-06 DIAGNOSIS — T83518A Infection and inflammatory reaction due to other urinary catheter, initial encounter: Secondary | ICD-10-CM | POA: Insufficient documentation

## 2021-05-06 DIAGNOSIS — Z87891 Personal history of nicotine dependence: Secondary | ICD-10-CM | POA: Insufficient documentation

## 2021-05-06 DIAGNOSIS — Z794 Long term (current) use of insulin: Secondary | ICD-10-CM | POA: Diagnosis not present

## 2021-05-06 DIAGNOSIS — Z7982 Long term (current) use of aspirin: Secondary | ICD-10-CM | POA: Insufficient documentation

## 2021-05-06 DIAGNOSIS — Z79899 Other long term (current) drug therapy: Secondary | ICD-10-CM | POA: Insufficient documentation

## 2021-05-06 DIAGNOSIS — X58XXXA Exposure to other specified factors, initial encounter: Secondary | ICD-10-CM | POA: Diagnosis not present

## 2021-05-06 DIAGNOSIS — T83010A Breakdown (mechanical) of cystostomy catheter, initial encounter: Secondary | ICD-10-CM

## 2021-05-06 DIAGNOSIS — Z7984 Long term (current) use of oral hypoglycemic drugs: Secondary | ICD-10-CM | POA: Diagnosis not present

## 2021-05-06 DIAGNOSIS — I110 Hypertensive heart disease with heart failure: Secondary | ICD-10-CM | POA: Diagnosis not present

## 2021-05-06 DIAGNOSIS — Z8616 Personal history of COVID-19: Secondary | ICD-10-CM | POA: Insufficient documentation

## 2021-05-06 LAB — URINALYSIS, ROUTINE W REFLEX MICROSCOPIC
Bilirubin Urine: NEGATIVE
Glucose, UA: NEGATIVE mg/dL
Ketones, ur: NEGATIVE mg/dL
Nitrite: NEGATIVE
Protein, ur: 30 mg/dL — AB
Specific Gravity, Urine: 1.014 (ref 1.005–1.030)
WBC, UA: >50 WBC/hpf — ABNORMAL HIGH (ref 0–5)
pH: 6 (ref 5.0–8.0)

## 2021-05-06 MED ORDER — ACETAMINOPHEN 325 MG PO TABS: 650.0000 mg | ORAL_TABLET | Freq: Once | ORAL | Status: DC

## 2021-05-06 MED ORDER — CEPHALEXIN 250 MG PO CAPS: 250.0000 mg | ORAL_CAPSULE | Freq: Three times a day (TID) | ORAL | 0 refills | Status: AC

## 2021-05-06 MED ORDER — CEFTRIAXONE SODIUM 1 G IJ SOLR
1.0000 g | Freq: Once | INTRAMUSCULAR | Status: AC
  Administered 2021-05-07: 1 g via INTRAMUSCULAR
  Filled 2021-05-06: qty 10

## 2021-05-06 MED ORDER — SODIUM CHLORIDE 0.9 % IV SOLN: 1.0000 g | Freq: Once | INTRAVENOUS | Status: DC

## 2021-05-06 NOTE — ED Provider Notes (Signed)
[facility] DEPT Provider Note   CSN: [id number] Arrival date & time: 05/06/21  1924     History Chief Complaint  Patient presents with   Urinary Retention    Shannon Garrett is a 60 y.o. female.  Patient has past medical history of MS, depression, cardiomyopathy, hypertension, CHF, history of CVA, paraplegia, obesity, history of neurogenic bladder requiring suprapubic catheter.  Patient presents to the emergency department from her facility, [facility] with complaint of clogged suprapubic catheter.  Staff at the facility noted a foul smell from urine.  Patient states that staff is noticed there has not been any urine output for 3 days.  She has however been urinating through her urethra.  She states that she has a history of chronic bladder infections and urinary retention that led to her having a suprapubic catheter but she is still able to urinate sometimes but has difficulty emptying her entire bladder.  She denies any suprapubic pain, fever, chills, flank pain, abdominal pain, nausea, vomiting, chest pain, shortness of breath.  HPI     Past Medical History:  Diagnosis Date   Cardiomyopathy ([place])    a.  Echo 04/29/12: Mild LVH, EF 20-25%, mild AI, moderate MR, moderate LAE, mild RAE, mild RVE, moderate TR, PASP 51, small pericardial effusion;   b. probably non-ischemic given multiple chemo-Tx agents used for MS and global LV dysfn on echo   Chronic systolic heart failure ([place])    COVID-19 05/10/2019   Per facility paperwork   Decubitus ulcer    stage II sacral decubitus ulcer.    Depression    Glaucoma    Hypertension    MS (multiple sclerosis) ([place])    a. Dx'd late 20's. b. Tx with Novantrone, Tysabri, Copaxone previously.    Patient Active Problem List   Diagnosis Date Noted   SVT (supraventricular tachycardia) ([place]) 05/19/2020   Cholangitis from CBD obstruction s/p ERCP 02/15/2020 02/16/2020   Choledocholithiasis s/p ERCP 02/15/2020  02/16/2020   Cholecystitis 02/12/2020   Abnormal nuclear stress test    Preoperative cardiovascular examination    Acute calculous cholecystitis 07/19/2019   Insulin dependent diabetes mellitus type IA ([place]) 07/19/2019   History of Clostridioides difficile colitis 07/19/2019   Multiple drug resistant organism (MDRO) Klebsiella pneumonia UTI culture positive 07/19/2019   Enteritis due to Clostridium difficile 06/24/2019   COVID-19 virus infection 06/24/2019   Palliative care by specialist    DNR (do not resuscitate)    Sepsis ([place]) 05/10/2019   History of COVID-19 Nov2020 05/10/2019   Dysesthesia 08/26/2018   Dyslipidemia 06/13/2017   Elevated alkaline phosphatase level 06/13/2017   Acute frontal sinusitis 05/24/2017   Influenza 04/19/2017   Glaucoma 04/06/2017   Allergic rhinitis due to allergen 03/07/2017   Vaginal candidiasis 03/01/2017   UTI (urinary tract infection) 01/02/2017   High risk medication use 01/25/2016   Neuropathy 11/13/2015   GERD (gastroesophageal reflux disease) 11/13/2015   Dysphagia as late effect of stroke 11/08/2015   Adrenal insufficiency (Addison's disease) ([place]) 08/12/2015   Vitamin D deficiency 06/18/2015   Hypokalemia 06/03/2015   Chronic anemia 05/30/2015   Chronic pain syndrome 03/03/2015   Essential hypertension    SIRS (systemic inflammatory response syndrome) ([place]) 01/11/2015   Dilated cardiomyopathy ([place])    Chronic constipation 11/26/2014   Depression 11/26/2014   Adult failure to thrive 07/31/2013   Insomnia 06/03/2013   Neurogenic bladder 06/03/2013   Paraplegia ([place]) 09/05/2012   Chronic diastolic CHF (congestive heart failure) ([place]) 05/22/2012  Obesity 11/30/2008   MS (multiple sclerosis) ([place]) 11/30/2008   Hypertensive heart disease with CHF (congestive heart failure) ([place]) 11/30/2008    Past Surgical History:  Procedure Laterality Date   ABLATION     uterine   BILIARY DILATION  02/15/2020   Procedure: BILIARY DILATION;   Surgeon: Rush Landmark Telford Nab., MD;  Location: [facility];  Service: Gastroenterology;;   CESAREAN SECTION     CHOLECYSTECTOMY N/A 07/20/2019   Procedure: DIAGNOSTIC LAPAROSCOPY;  Surgeon: [name], MD;  Location: [facility];  Service: General;  Laterality: N/A;   CHOLECYSTECTOMY N/A 02/12/2020   Procedure: LAPAROSCOPIC CHOLECYSTECTOMY;  Surgeon: Clovis Riley, MD;  Location: [facility];  Service: General;  Laterality: N/A;   ERCP N/A 02/15/2020   Procedure: ENDOSCOPIC RETROGRADE CHOLANGIOPANCREATOGRAPHY (ERCP);  Surgeon: Irving Copas., MD;  Location: [facility];  Service: Gastroenterology;  Laterality: N/A;   IR EXCHANGE BILIARY DRAIN  09/07/2019   IR PERC CHOLECYSTOSTOMY  07/21/2019   LEFT HEART CATH AND CORONARY ANGIOGRAPHY N/A 10/29/2019   Procedure: LEFT HEART CATH AND CORONARY ANGIOGRAPHY;  Surgeon: Leonie Man, MD;  Location: [facility];  Service: Cardiovascular;  Laterality: N/A;   REMOVAL OF STONES  02/15/2020   Procedure: REMOVAL OF STONES;  Surgeon: Rush Landmark Telford Nab., MD;  Location: [facility];  Service: Gastroenterology;;   Joan Mayans  02/15/2020   Procedure: Joan Mayans;  Surgeon: Rush Landmark Telford Nab., MD;  Location: [facility];  Service: Gastroenterology;;     OB History   No obstetric history on file.     Family History  Problem Relation Age of Onset   Hypertension Mother    Cancer Mother        breast    Cancer Father        prostate   Multiple sclerosis Sister    Heart attack Neg Hx     Social History   Tobacco Use   Smoking status: Former    Packs/day: 0.50    Years: 5.00    Pack years: 2.50    Types: Cigarettes   Smokeless tobacco: Never  Vaping Use   Vaping Use: Never used  Substance Use Topics   Alcohol use: No   Drug use: No    Home Medications Prior to Admission medications   Medication Sig Start Date End Date Taking? Authorizing Provider  cephALEXin (KEFLEX) 250 MG capsule Take 1 capsule (250 mg  total) by mouth 3 (three) times daily for 5 days. 05/06/21 05/11/21 Yes Cambryn Charters, Adora Fridge, PA-C  acetaminophen (TYLENOL) 325 MG tablet Take 650 mg by mouth every 8 (eight) hours as needed for mild pain, moderate pain or headache.     [provider]  albuterol (PROVENTIL) (2.5 MG/3ML) 0.083% nebulizer solution Take 2.5 mg by nebulization every 6 (six) hours as needed for wheezing or shortness of breath.    [provider]  albuterol (VENTOLIN HFA) 108 (90 Base) MCG/ACT inhaler Inhale 2 puffs into the lungs every 6 (six) hours as needed for wheezing or shortness of breath.  07/10/19   [provider]  amantadine (SYMMETREL) 100 MG capsule Take 100 mg by mouth daily.     [provider]  ascorbic acid (VITAMIN C) 500 MG tablet Take 500 mg by mouth daily.     [provider]  aspirin 325 MG EC tablet Take 325 mg by mouth daily.    [provider]  Baclofen 5 MG TABS Take 5 mg by mouth 4 (four) times daily. Hold if lethargic  or confused 12/04/17   [provider]  buPROPion (WELLBUTRIN SR) 150 MG 12 hr tablet Take 150 mg by mouth daily. 01/23/20   [provider]  Cranberry 400 MG TABS Take 400 mg by mouth daily.     [provider]  diphenhydrAMINE (BENADRYL) 2 % cream Apply 1 application topically every 12 (twelve) hours as needed for itching.    [provider]  diphenhydrAMINE (BENADRYL) 25 mg capsule Take 25 mg by mouth every 6 (six) hours as needed for itching or allergies (sob).     [provider]  DULoxetine (CYMBALTA) 60 MG capsule Take 60 mg by mouth daily. 03/29/20   [provider]  Ensure (ENSURE) Take 237 mLs by mouth 2 (two) times daily between meals.    [provider]  famotidine (PEPCID) 20 MG tablet Take 1 tablet (20 mg total) by mouth 2 (two) times daily. 07/25/15   Rai, Vernelle Emerald, MD  ferrous sulfate 325 (65 FE) MG tablet Take 325 mg by mouth 2 (two) times daily with a meal.   02/20/18   [provider]  fluticasone (FLONASE) 50 MCG/ACT nasal spray Place 2 sprays into both nostrils every evening.    [provider]  gabapentin (NEURONTIN) 800 MG tablet Take 800 mg by mouth 3 (three) times daily.    [provider]  hydrocortisone cream 1 % Apply 1 application topically every 12 (twelve) hours as needed for itching.    [provider]  insulin aspart (NOVOLOG FLEXPEN) 100 UNIT/ML FlexPen 0-15 Units, Subcutaneous, 3 times daily with meals CBG < 70: implement hypoglycemia protocol-call MD CBG 70 - 120: 0 units CBG 121 - 150: 2 units CBG 151 - 200: 3 units CBG 201 - 250: 5 units CBG 251 - 300: 8 units CBG 301 - 350: 11 units CBG 351 - 400: 15 units CBG > 400: Patient taking differently: Inject 0-15 Units into the skin 3 (three) times daily with meals. 0-15 Units, Subcutaneous, 3 times daily with meals CBG < 70: implement hypoglycemia protocol-call MD CBG 0- 120: 0 units CBG 121 - 150: 2 units CBG 151 - 200: 3 units CBG 201 - 250: 5 units CBG 251 - 300: 8 units CBG 301 - 400: 15 units CBG >400, CALL MD 05/18/19   Jonetta Osgood, MD  insulin glargine (LANTUS) 100 UNIT/ML injection Inject 0.1 mLs (10 Units total) into the skin daily. Patient not taking: Reported on 11/03/2020 05/19/19   Jonetta Osgood, MD  LANTUS SOLOSTAR 100 UNIT/ML Solostar Pen Inject 10 Units into the skin at bedtime.  Patient not taking: Reported on 11/03/2020 04/23/20   [provider]  latanoprost (XALATAN) 0.005 % ophthalmic solution Place 1 drop into both eyes at bedtime.    [provider]  linaclotide (LINZESS) 290 MCG CAPS capsule Take 290 mcg by mouth daily before breakfast.    [provider]  lubiprostone (AMITIZA) 24 MCG capsule Take 24 mcg by mouth 2 (two) times daily with a meal. Patient not taking: Reported on 11/03/2020    [provider]  Melatonin 5 MG TABS Take 10 mg by mouth at bedtime.  11/14/17    [provider]  metoprolol succinate (TOPROL-XL) 100 MG 24 hr tablet Take 150 mg by mouth daily. Take with or immediately following a meal.    [provider]  Multiple Vitamins-Minerals (MULTIVITAMIN ADULT PO) Take 1 tablet by mouth daily.    [provider]  omeprazole (PRILOSEC) 20  MG capsule Take 20 mg by mouth daily.    [provider]  ondansetron (ZOFRAN) 4 MG tablet Take 4 mg by mouth every 6 (six) hours as needed for nausea or vomiting. Patient not taking: Reported on 11/03/2020 11/01/17   [provider]  polyethylene glycol (MIRALAX MIX-IN PAX) 17 g packet Take 17 g by mouth daily. 05/21/20   Donne Hazel, MD  Potassium Chloride ER 20 MEQ TBCR Take 1 tablet by mouth daily. 05/17/20   [provider]  Probiotic Product (PROBIOTIC PO) Take 1 capsule by mouth daily. Patient not taking: Reported on 11/03/2020    [provider]  senna-docusate (SENOKOT-S) 8.6-50 MG tablet Take 2 tablets by mouth 2 (two) times daily.  Patient not taking: Reported on 11/03/2020    [provider]  simethicone (MYLICON) 528 MG chewable tablet Chew 125 mg by mouth every 6 (six) hours as needed for flatulence. Patient not taking: Reported on 11/03/2020    [provider]  spironolactone (ALDACTONE) 25 MG tablet Take 25 mg by mouth daily. 05/02/20   [provider]  tamsulosin (FLOMAX) 0.4 MG CAPS capsule Take 0.4 mg by mouth daily. For incontinence    [provider]  traMADol (ULTRAM) 50 MG tablet Take 100 mg by mouth every 6 (six) hours as needed (Pain). Patient not taking: Reported on 11/03/2020    [provider]    Allergies    Sulfonamide derivatives and Penicillins  Review of Systems   Review of Systems  Constitutional:  Negative for chills and fever.  HENT:  Negative for congestion, rhinorrhea and sore throat.   Eyes:  Negative for visual disturbance.  Respiratory:  Negative for cough, chest  tightness and shortness of breath.   Cardiovascular:  Negative for chest pain, palpitations and leg swelling.  Gastrointestinal:  Negative for abdominal pain, blood in stool, constipation, diarrhea, nausea and vomiting.  Genitourinary:  Positive for decreased urine volume. Negative for dysuria, flank pain, hematuria and pelvic pain.  Musculoskeletal:  Negative for back pain.  Skin:  Negative for rash and wound.  Neurological:  Negative for dizziness, syncope, weakness, light-headedness and headaches.  Psychiatric/Behavioral:  Negative for confusion.   All other systems reviewed and are negative.  Physical Exam Updated Vital Signs BP (!) 142/66   Pulse 70   Temp 99.1 F (37.3 C) (Oral)   Resp (!) 9   Ht 5\' 3"  (1.6 m)   Wt 86.2 kg   SpO2 98%   BMI 33.66 kg/m   Physical Exam Vitals and nursing note reviewed.  Constitutional:      General: She is not in acute distress.    Appearance: Normal appearance. She is well-developed. She is obese. She is ill-appearing. She is not toxic-appearing or diaphoretic.     Comments: Chronically ill-appearing  HENT:     Head: Normocephalic and atraumatic.     Nose: No nasal deformity.     Mouth/Throat:     Lips: Pink. No lesions.  Eyes:     General: Gaze aligned appropriately. No scleral icterus.       Right eye: No discharge.        Left eye: No discharge.     Conjunctiva/sclera: Conjunctivae normal.     Right eye: Right conjunctiva is not injected. No exudate or hemorrhage.    Left eye: Left conjunctiva is not injected. No exudate or hemorrhage. Pulmonary:     Effort: Pulmonary effort is normal. No respiratory distress.  Abdominal:  General: Abdomen is flat. There is no distension.     Palpations: Abdomen is soft. There is no mass.     Tenderness: There is no abdominal tenderness. There is no right CVA tenderness, left CVA tenderness, guarding or rebound.     Hernia: No hernia is present.     Comments: Abdominal exam is soft, nontender  to palpation.  No suprapubic tenderness to palpation.  Suprapubic catheter in place.  Surrounding site with no erythema or signs of infection.  Genitourinary:    Comments: Patient is wearing briefs with evidence of urine.  No obvious blood noted.  Foul smell noted. Suprapubic catheter in place with no urine in bag. Skin:    General: Skin is warm and dry.  Neurological:     Mental Status: She is alert and oriented to person, place, and time.  Psychiatric:        Mood and Affect: Mood normal.        Speech: Speech normal.        Behavior: Behavior normal. Behavior is cooperative.    ED Results / Procedures / Treatments   Labs (all labs ordered are listed, but only abnormal results are displayed) Labs Reviewed  URINALYSIS, ROUTINE W REFLEX MICROSCOPIC - Abnormal; Notable for the following components:      Result Value   APPearance HAZY (*)    Hgb urine dipstick MODERATE (*)    Protein, ur 30 (*)    Leukocytes,Ua LARGE (*)    WBC, UA >50 (*)    Bacteria, UA MANY (*)    All other components within normal limits  URINE CULTURE    EKG None  Radiology No results found.  Procedures Procedures   Medications Ordered in ED Medications  acetaminophen (TYLENOL) tablet 650 mg (has no administration in time range)  cefTRIAXone (ROCEPHIN) injection 1 g (has no administration in time range)    ED Course  I have reviewed the triage vital signs and the nursing notes.  Pertinent labs & imaging results that were available during my care of the patient were reviewed by me and considered in my medical decision making (see chart for details).  Clinical Course as of 05/06/21 2350  Sat May 06, 2021  2217 Suprapubic catheter replaced and urinalysis collected. [GL]    Clinical Course User Index [GL] Haidar Muse, Adora Fridge, PA-C   MDM Rules/Calculators/A&P                          This is a 60 y.o. female who presents to the ED with clogged suprapubic catheter.  Catheter has been clogged for  about 3 days now.  Patient is still been able to urinate through her urethra normally, however foul smell was noted.  Vitals labile.  Patient is afebrile normal vitals.  Patient is chronically ill-appearing and in no acute distress.  I attempted to flush catheter with no success.  I did see that patient has been urinating on her briefs.  Will obtain bladder ultrasound to make sure she is not retaining urine.  Also obtain urinalysis to evaluate for infection.  Suprapubic catheter was exchanged with a 16 French lumen catheter and new bag replaced.  Urinalysis notable for large amount of bacteria.  Reviewed previous urine cultures where she had multiple bacteria that was not specified.  She was previously been treated on Rocephin and sent home on Keflex so we will try the same regimen again this has worked for her in  the past.  We will give 1 dose of Rocephin prior to discharge.  We will send her home with oral antibiotics to treat UTI. Return precautions if symptoms worsen.   Portions of this note were generated with [employer]. Dictation errors may occur despite best attempts at proofreading.   Final Clinical Impression(s) / ED Diagnoses Final diagnoses:  Suprapubic catheter dysfunction, initial encounter [facility])  Urinary tract infection without hematuria, site unspecified    Rx / DC Orders ED Discharge Orders          Ordered    cephALEXin (KEFLEX) 250 MG capsule  3 times daily        05/06/21 2349             Adolphus Birchwood, PA-C 05/06/21 2350    Valarie Merino, MD 05/07/21 2311

## 2021-05-06 NOTE — Discharge Instructions (Signed)
You have been diagnosed with a UTI. Please take all of your antibiotics until finished!   You may develop abdominal discomfort or diarrhea from the antibiotic.  You may help offset this with probiotics which you can buy or get in yogurt. Do not eat  or take the probiotics until 2 hours after your antibiotic.

## 2021-05-06 NOTE — ED Triage Notes (Signed)
Pt to ED via [facility] from [facility] with c/o clogged suprapubic catheter.  Staff also noted foul smell from urine.  Pt denies pain.

## 2021-05-07 DIAGNOSIS — N390 Urinary tract infection, site not specified: Secondary | ICD-10-CM | POA: Diagnosis not present

## 2021-05-07 MED ORDER — LIDOCAINE HCL 1 % IJ SOLN
INTRAMUSCULAR | Status: AC
  Filled 2021-05-07: qty 20

## 2021-05-09 LAB — URINE CULTURE: Culture: >=100000 — AB

## 2021-05-10 ENCOUNTER — Telehealth: Payer: Self-pay | Admitting: Emergency Medicine

## 2021-05-10 NOTE — Telephone Encounter (Signed)
Post ED Visit - Positive Culture Follow-up: Successful Patient Follow-Up  Culture assessed and recommendations reviewed by:  []  Elenor Quinones, Pharm.D. []  Heide Guile, Pharm.D., BCPS AQ-ID []  Parks Neptune, Pharm.D., BCPS []  Alycia Rossetti, Pharm.D., BCPS []  [place], Pharm.D., BCPS, AAHIVP []  Legrand Como, Pharm.D., BCPS, AAHIVP []  Salome Arnt, PharmD, BCPS []  Johnnette Gourd, PharmD, BCPS []  Hughes Better, PharmD, BCPS []  Leeroy Cha, PharmD Zenaida Deed PharmD  Positive urine culture  []  Patient discharged without antimicrobial prescription and treatment is now indicated []  Organism is resistant to prescribed ED discharge antimicrobial []  Patient with positive blood cultures  Changes discussed with ED provider: Lavenia Atlas DO New antibiotic prescription   recommend stopping antibiotics Faxed to [facility] @ [phone] per order  attention: Bridgett     Hazle Nordmann 05/10/2021, 9:00 AM

## 2021-05-14 ENCOUNTER — Emergency Department (HOSPITAL_COMMUNITY): Payer: 59

## 2021-05-14 ENCOUNTER — Other Ambulatory Visit: Payer: Self-pay

## 2021-05-14 ENCOUNTER — Emergency Department (HOSPITAL_COMMUNITY)
Admission: EM | Admit: 2021-05-14 | Discharge: 2021-05-14 | Disposition: A | Discharge status: Skilled Nursing Facility | Payer: 59 | Attending: Emergency Medicine | Admitting: Emergency Medicine

## 2021-05-14 DIAGNOSIS — Z794 Long term (current) use of insulin: Secondary | ICD-10-CM | POA: Insufficient documentation

## 2021-05-14 DIAGNOSIS — R519 Headache, unspecified: Secondary | ICD-10-CM | POA: Insufficient documentation

## 2021-05-14 DIAGNOSIS — E109 Type 1 diabetes mellitus without complications: Secondary | ICD-10-CM | POA: Insufficient documentation

## 2021-05-14 DIAGNOSIS — I110 Hypertensive heart disease with heart failure: Secondary | ICD-10-CM | POA: Diagnosis not present

## 2021-05-14 DIAGNOSIS — W19XXXA Unspecified fall, initial encounter: Secondary | ICD-10-CM

## 2021-05-14 DIAGNOSIS — Z87891 Personal history of nicotine dependence: Secondary | ICD-10-CM | POA: Insufficient documentation

## 2021-05-14 DIAGNOSIS — Z79899 Other long term (current) drug therapy: Secondary | ICD-10-CM | POA: Insufficient documentation

## 2021-05-14 DIAGNOSIS — Z8616 Personal history of COVID-19: Secondary | ICD-10-CM | POA: Diagnosis not present

## 2021-05-14 DIAGNOSIS — I5022 Chronic systolic (congestive) heart failure: Secondary | ICD-10-CM | POA: Diagnosis not present

## 2021-05-14 DIAGNOSIS — M542 Cervicalgia: Secondary | ICD-10-CM | POA: Diagnosis not present

## 2021-05-14 DIAGNOSIS — S299XXA Unspecified injury of thorax, initial encounter: Secondary | ICD-10-CM | POA: Diagnosis present

## 2021-05-14 DIAGNOSIS — S2241XA Multiple fractures of ribs, right side, initial encounter for closed fracture: Secondary | ICD-10-CM

## 2021-05-14 DIAGNOSIS — S42111A Displaced fracture of body of scapula, right shoulder, initial encounter for closed fracture: Secondary | ICD-10-CM | POA: Diagnosis not present

## 2021-05-14 DIAGNOSIS — S4991XA Unspecified injury of right shoulder and upper arm, initial encounter: Secondary | ICD-10-CM | POA: Diagnosis not present

## 2021-05-14 MED ORDER — OXYCODONE-ACETAMINOPHEN 5-325 MG PO TABS: 1.0000 | ORAL_TABLET | Freq: Four times a day (QID) | ORAL | 0 refills | Status: DC | PRN

## 2021-05-14 MED ORDER — HYDROCODONE-ACETAMINOPHEN 5-325 MG PO TABS
1.0000 | ORAL_TABLET | Freq: Once | ORAL | Status: AC
  Administered 2021-05-14: 1 via ORAL
  Filled 2021-05-14: qty 1

## 2021-05-14 MED ORDER — OXYCODONE-ACETAMINOPHEN 5-325 MG PO TABS
1.0000 | ORAL_TABLET | Freq: Once | ORAL | Status: AC
  Administered 2021-05-14: 1 via ORAL
  Filled 2021-05-14: qty 1

## 2021-05-14 NOTE — ED Notes (Signed)
Patient back from CT.

## 2021-05-14 NOTE — ED Provider Notes (Signed)
[facility] DEPT Provider Note   CSN: [id number] Arrival date & time: 05/14/21  1125     History Chief Complaint  Patient presents with   Fall   Shoulder Injury    Shannon Garrett is a 60 y.o. female.  Patient is a 60 year old female with a history of MS with significant involvement causing her to be bedbound, cardiomyopathy, CHF, hypertension who is presenting today from her facility after they dropped her out of the [facility] lift.  She reports that they were moving her when they dropped her onto her right side onto the floor.  She has severe sharp searing pain 9 out of 10 in her right shoulder which does not radiate is worse anytime she tries to move it and she reports she did hit her head and has a mild right-sided headache and some neck pain.  She denies any pain in her legs, chest or abdomen.  She does not take any anticoagulation.  They gave her tramadol at the facility with minimal improvement in the pain.  The history is provided by the patient and the EMS personnel.  Fall  Shoulder Injury      Past Medical History:  Diagnosis Date   Cardiomyopathy [facility])    a.  Echo 04/29/12: Mild LVH, EF 20-25%, mild AI, moderate MR, moderate LAE, mild RAE, mild RVE, moderate TR, PASP 51, small pericardial effusion;   b. probably non-ischemic given multiple chemo-Tx agents used for MS and global LV dysfn on echo   Chronic systolic heart failure ([place])    COVID-19 05/10/2019   Per facility paperwork   Decubitus ulcer    stage II sacral decubitus ulcer.    Depression    Glaucoma    Hypertension    MS (multiple sclerosis) ([place])    a. Dx'd late 20's. b. Tx with Novantrone, Tysabri, Copaxone previously.    Patient Active Problem List   Diagnosis Date Noted   SVT (supraventricular tachycardia) ([place]) 05/19/2020   Cholangitis from CBD obstruction s/p ERCP 02/15/2020 02/16/2020   Choledocholithiasis s/p ERCP 02/15/2020 02/16/2020   Cholecystitis 02/12/2020    Abnormal nuclear stress test    Preoperative cardiovascular examination    Acute calculous cholecystitis 07/19/2019   Insulin dependent diabetes mellitus type IA ([place]) 07/19/2019   History of Clostridioides difficile colitis 07/19/2019   Multiple drug resistant organism (MDRO) Klebsiella pneumonia UTI culture positive 07/19/2019   Enteritis due to Clostridium difficile 06/24/2019   COVID-19 virus infection 06/24/2019   Palliative care by specialist    DNR (do not resuscitate)    Sepsis ([place]) 05/10/2019   History of COVID-19 Nov2020 05/10/2019   Dysesthesia 08/26/2018   Dyslipidemia 06/13/2017   Elevated alkaline phosphatase level 06/13/2017   Acute frontal sinusitis 05/24/2017   Influenza 04/19/2017   Glaucoma 04/06/2017   Allergic rhinitis due to allergen 03/07/2017   Vaginal candidiasis 03/01/2017   UTI (urinary tract infection) 01/02/2017   High risk medication use 01/25/2016   Neuropathy 11/13/2015   GERD (gastroesophageal reflux disease) 11/13/2015   Dysphagia as late effect of stroke 11/08/2015   Adrenal insufficiency (Addison's disease) ([place]) 08/12/2015   Vitamin D deficiency 06/18/2015   Hypokalemia 06/03/2015   Chronic anemia 05/30/2015   Chronic pain syndrome 03/03/2015   Essential hypertension    SIRS (systemic inflammatory response syndrome) ([place]) 01/11/2015   Dilated cardiomyopathy ([place])    Chronic constipation 11/26/2014   Depression 11/26/2014   Adult failure to thrive 07/31/2013   Insomnia 06/03/2013   Neurogenic bladder  06/03/2013   Paraplegia ([place]) 09/05/2012   Chronic diastolic CHF (congestive heart failure) ([place]) 05/22/2012   Obesity 11/30/2008   MS (multiple sclerosis) ([place]) 11/30/2008   Hypertensive heart disease with CHF (congestive heart failure) ([place]) 11/30/2008    Past Surgical History:  Procedure Laterality Date   ABLATION     uterine   BILIARY DILATION  02/15/2020   Procedure: BILIARY DILATION;  Surgeon: Irving Copas., MD;   Location: [facility];  Service: Gastroenterology;;   CESAREAN SECTION     CHOLECYSTECTOMY N/A 07/20/2019   Procedure: DIAGNOSTIC LAPAROSCOPY;  Surgeon: [name], MD;  Location: [facility];  Service: General;  Laterality: N/A;   CHOLECYSTECTOMY N/A 02/12/2020   Procedure: LAPAROSCOPIC CHOLECYSTECTOMY;  Surgeon: Clovis Riley, MD;  Location: [facility];  Service: General;  Laterality: N/A;   ERCP N/A 02/15/2020   Procedure: ENDOSCOPIC RETROGRADE CHOLANGIOPANCREATOGRAPHY (ERCP);  Surgeon: Irving Copas., MD;  Location: [facility];  Service: Gastroenterology;  Laterality: N/A;   IR EXCHANGE BILIARY DRAIN  09/07/2019   IR PERC CHOLECYSTOSTOMY  07/21/2019   LEFT HEART CATH AND CORONARY ANGIOGRAPHY N/A 10/29/2019   Procedure: LEFT HEART CATH AND CORONARY ANGIOGRAPHY;  Surgeon: Leonie Man, MD;  Location: [facility];  Service: Cardiovascular;  Laterality: N/A;   REMOVAL OF STONES  02/15/2020   Procedure: REMOVAL OF STONES;  Surgeon: Rush Landmark Telford Nab., MD;  Location: [facility];  Service: Gastroenterology;;   Joan Mayans  02/15/2020   Procedure: Joan Mayans;  Surgeon: Rush Landmark Telford Nab., MD;  Location: [facility];  Service: Gastroenterology;;     OB History   No obstetric history on file.     Family History  Problem Relation Age of Onset   Hypertension Mother    Cancer Mother        breast    Cancer Father        prostate   Multiple sclerosis Sister    Heart attack Neg Hx     Social History   Tobacco Use   Smoking status: Former    Packs/day: 0.50    Years: 5.00    Pack years: 2.50    Types: Cigarettes   Smokeless tobacco: Never  Vaping Use   Vaping Use: Never used  Substance Use Topics   Alcohol use: No   Drug use: No    Home Medications Prior to Admission medications   Medication Sig Start Date End Date Taking? Authorizing Provider  acetaminophen (TYLENOL) 325 MG tablet Take 650 mg by mouth every 8 (eight) hours as needed for mild  pain, moderate pain or headache.    Yes [provider]  albuterol (PROVENTIL) (2.5 MG/3ML) 0.083% nebulizer solution Take 2.5 mg by nebulization every 6 (six) hours as needed for wheezing or shortness of breath.   Yes [provider]  albuterol (VENTOLIN HFA) 108 (90 Base) MCG/ACT inhaler Inhale 2 puffs into the lungs every 6 (six) hours as needed for wheezing or shortness of breath.  07/10/19  Yes [provider]  amantadine (SYMMETREL) 100 MG capsule Take 100 mg by mouth daily.    Yes [provider]  ascorbic acid (VITAMIN C) 500 MG tablet Take 500 mg by mouth daily.    Yes [provider]  aspirin 325 MG EC tablet Take 325 mg by mouth daily.   Yes [provider]  azelastine (ASTELIN) 0.1 % nasal spray Place 2 sprays into both nostrils 2 (two) times daily.   Yes [provider]  AZO-CRANBERRY PO Take  2 capsules by mouth daily.   Yes [provider]  baclofen (LIORESAL) 10 MG tablet Take 5 mg by mouth 4 (four) times daily. Hold if lethargic or confused   Yes [provider]  bisacodyl (DULCOLAX) 10 MG suppository Place 10 mg rectally daily as needed for moderate constipation. If no results for MOM   Yes [provider]  buPROPion (WELLBUTRIN XL) 150 MG 24 hr tablet Take 150 mg by mouth daily.   Yes [provider]  DULoxetine (CYMBALTA) 30 MG capsule Take 30 mg by mouth daily.   Yes [provider]  Ensure (ENSURE) Take 237 mLs by mouth 3 (three) times daily.   Yes [provider]  ferrous sulfate 325 (65 FE) MG tablet Take 325 mg by mouth 2 (two) times daily with a meal.  02/20/18  Yes [provider]  fluticasone (FLONASE) 50 MCG/ACT nasal spray Place 2 sprays into both nostrils at bedtime.   Yes [provider]  gabapentin (NEURONTIN) 800 MG tablet Take 800 mg by mouth 3 (three) times daily.   Yes [provider]  insulin aspart (NOVOLOG FLEXPEN) 100  UNIT/ML FlexPen 0-15 Units, Subcutaneous, 3 times daily with meals CBG < 70: implement hypoglycemia protocol-call MD CBG 70 - 120: 0 units CBG 121 - 150: 2 units CBG 151 - 200: 3 units CBG 201 - 250: 5 units CBG 251 - 300: 8 units CBG 301 - 350: 11 units CBG 351 - 400: 15 units CBG > 400: Patient taking differently: Inject 0-15 Units into the skin 3 (three) times daily with meals. 0-15 Units, Subcutaneous, 3 times daily with meals per sliding scale CBG < 70: implement hypoglycemia protocol-call MD CBG 0- 120: 0 units CBG 121 - 150: 2 units CBG 151 - 200: 3 units CBG 201 - 250: 5 units CBG 251 - 300: 8 units CBG 301 - 350: 11 units CBG 351 - 400: 15 units CBG >400, CALL MD 05/18/19  Yes Ghimire, Henreitta Leber, MD  LANTUS SOLOSTAR 100 UNIT/ML Solostar Pen Inject 5 Units into the skin daily. 04/23/20  Yes [provider]  latanoprost (XALATAN) 0.005 % ophthalmic solution Place 1 drop into both eyes at bedtime.   Yes [provider]  linaclotide (LINZESS) 290 MCG CAPS capsule Take 290 mcg by mouth daily before breakfast.   Yes [provider]  Melatonin 5 MG TABS Take 10 mg by mouth at bedtime.  11/14/17  Yes [provider]  metoprolol succinate (TOPROL-XL) 100 MG 24 hr tablet Take 150 mg by mouth daily. Take with or immediately following a meal.   Yes [provider]  Multiple Vitamins-Minerals (MULTIVITAMIN ADULT PO) Take 1 tablet by mouth daily.   Yes [provider]  ondansetron (ZOFRAN) 4 MG tablet Take 4 mg by mouth every 6 (six) hours as needed for nausea or vomiting. 11/01/17  Yes [provider]  polyethylene glycol (MIRALAX MIX-IN PAX) 17 g packet Take 17 g by mouth daily. 05/21/20  Yes Donne Hazel, MD  Potassium Chloride ER 20 MEQ TBCR Take 1 tablet by mouth daily. 05/17/20  Yes [provider]  Probiotic Product (PROBIOTIC PO) Take 1 capsule by mouth 2 (two) times daily.   Yes [provider]   senna-docusate (SENOKOT-S) 8.6-50 MG tablet Take 2 tablets by mouth 2 (two) times daily.   Yes [provider]  simethicone (MYLICON) 494 MG chewable tablet Chew 125 mg by mouth every 6 (six) hours as needed for  flatulence.   Yes [provider]  spironolactone (ALDACTONE) 25 MG tablet Take 25 mg by mouth daily. 05/02/20  Yes [provider]  traMADol (ULTRAM) 50 MG tablet Take 100 mg by mouth every 6 (six) hours as needed (Pain).   Yes [provider]  cefTRIAXone (ROCEPHIN) 1 g injection Inject 1 g into the muscle once. Patient not taking: No sig reported    [provider]  cephALEXin (KEFLEX) 250 MG capsule Take 250 mg by mouth 3 (three) times daily. For 5 days Patient not taking: No sig reported    [provider]    Allergies    Sulfonamide derivatives and Penicillins  Review of Systems   Review of Systems  All other systems reviewed and are negative.  Physical Exam Updated Vital Signs BP (!) 155/76   Pulse 73   Temp 98.7 F (37.1 C) (Oral)   Resp 16   SpO2 99%   Physical Exam Vitals and nursing note reviewed.  Constitutional:      General: She is not in acute distress.    Appearance: Normal appearance.  HENT:     Head: Normocephalic.     Mouth/Throat:     Mouth: Mucous membranes are moist.  Eyes:     Extraocular Movements: Extraocular movements intact.     Pupils: Pupils are equal, round, and reactive to light.  Cardiovascular:     Rate and Rhythm: Normal rate and regular rhythm.  Pulmonary:     Effort: Pulmonary effort is normal. No respiratory distress.     Breath sounds: Normal breath sounds.  Abdominal:     General: Abdomen is flat.     Palpations: Abdomen is soft.  Musculoskeletal:        General: Tenderness present.     Right shoulder: Tenderness and bony tenderness present. Decreased range of motion. Normal pulse.     Right elbow: Normal.     Right wrist: Normal.     Cervical back: Neck supple.  Tenderness present. Spinous process tenderness and muscular tenderness present.  Neurological:     Mental Status: She is alert and oriented to person, place, and time. Mental status is at baseline.     Comments: Paralysis and contractures noted and left upper extremity, weakness in the right upper extremity with some mobility and bilateral lower extremity weakness and contracture  Psychiatric:        Mood and Affect: Mood normal.    ED Results / Procedures / Treatments   Labs (all labs ordered are listed, but only abnormal results are displayed) Labs Reviewed - No data to display  EKG None  Radiology DG Shoulder Right  Result Date: 05/14/2021 CLINICAL DATA:  Provided history: Pain after being dropped. EXAM: RIGHT SHOULDER - 2+ VIEW COMPARISON:  CT chest 05/19/2020. FINDINGS: Mildly displaced acute fracture of the scapula at the base of the glenoid without clear extension into the glenoid. Apparent subtle lucency through the posterior aspect of the humeral neck, which may reflect overlapping osteophyte, however, an acute fracture of the proximal humerus cannot be excluded. There are at least 3 acute fractures of upper to mid right sided ribs (some nondisplaced and some displaced). IMPRESSION: Mildly displaced acute fracture of the scapula at the base of the glenoid, without clear extension into the glenoid. Additionally, there is apparent subtle lucency through the posterior aspect of the humeral neck, which may reflect overlapping osteophyte. However, an acute fracture of the proximal humerus cannot be excluded and a CT of  the right shoulder is recommended for further evaluation. There are least three fractured right-sided ribs (some displaced and some nondisplaced). Electronically Signed   By: Kellie Simmering D.O.   On: 05/14/2021 12:43   CT Head Wo Contrast  Result Date: 05/14/2021 CLINICAL DATA:  Head trauma, moderate/severe. Neck trauma, midline tenderness. Additional history provided:  Patient reports headache and neck pain status post fall, history of multiple sclerosis. EXAM: CT HEAD WITHOUT CONTRAST CT CERVICAL SPINE WITHOUT CONTRAST TECHNIQUE: Multidetector CT imaging of the head and cervical spine was performed following the standard protocol without intravenous contrast. Multiplanar CT image reconstructions of the cervical spine were also generated. COMPARISON:  Prior head CT examinations 11/07/2015 and earlier. Brain MRI 07/11/2015. Cervical spine MRI 10/08/2014. FINDINGS: CT HEAD FINDINGS Brain: Mildly motion degraded exam. Mild generalized cerebral and cerebellar atrophy. Patchy and ill-defined hypoattenuation within the cerebral white matter, progressed from the head CT of 11/07/2015. The 1.2 cm right-sided pituitary lesion demonstrated on the prior brain MRI of 07/11/2015 is poorly reassessed on this noncontrast head CT. There is no acute intracranial hemorrhage. No demarcated cortical infarct. No extra-axial fluid collection. No midline shift. Vascular: No hyperdense vessel.  Atherosclerotic calcifications. Skull: Normal. Negative for fracture or focal lesion. Sinuses/Orbits: Visualized orbits show no acute finding. No significant paranasal sinus disease. CT CERVICAL SPINE FINDINGS Alignment: Mild reversal of the expected cervical lordosis. Chronic 2 mm trace C7-T1 grade 1 anterolisthesis. Partially imaged thoracic levocurvature. Skull base and vertebrae: The basion-dental and atlanto-dental intervals are maintained.No evidence of acute fracture to the cervical spine. Soft tissues and spinal canal: No prevertebral fluid or swelling. No visible canal hematoma. Disc levels: Cervical spondylosis with multilevel disc space narrowing, disc bulges/central disc protrusions, posterior disc osteophytes, endplate spurring and uncovertebral hypertrophy. No appreciable high-grade spinal canal stenosis. Multilevel bony neural foraminal narrowing. Multilevel ventral osteophytes. Upper chest: No  consolidation within the imaged lung apices. No visible pneumothorax. IMPRESSION: CT head: 1. Mildly motion degraded exam. 2. No evidence of acute intracranial abnormality. 3. The 1.2 cm lesion within the right pituitary gland demonstrated on the prior brain MRI of 07/11/2015 is poorly reassessed on this non-contrast head CT. 4. Cerebral white matter disease, compatible with the provided clinical history of multiple sclerosis and progressed from the prior head CT of 05/14/2021. 5. Mild generalized parenchymal atrophy. CT cervical spine: 1. No evidence of acute fracture to the cervical spine. 2. Reversal of the expected cervical lordosis. 3. Chronic 2 mm C7-T1 grade 1 anterolisthesis. 4. Cervical spondylosis, as described. Electronically Signed   By: Kellie Simmering D.O.   On: 05/14/2021 12:50   CT Chest Wo Contrast  Result Date: 05/14/2021 CLINICAL DATA:  Chest trauma, mod-severe EXAM: CT CHEST WITHOUT CONTRAST TECHNIQUE: Multidetector CT imaging of the chest was performed following the standard protocol without IV contrast. COMPARISON:  May 19, 2020 FINDINGS: Cardiovascular: Heart is mildly enlarged. Mild atherosclerotic calcifications of the aorta. No pericardial effusion. Mediastinum/Nodes: No axillary or mediastinal adenopathy. Visualized thyroid is unremarkable. Lungs/Pleura: No pleural effusion or pneumothorax. Minimal dependent atelectasis. Upper Abdomen: Status post cholecystectomy with mild dilation of the common bile duct likely due to post cholecystectomy state. No acute abnormality. Musculoskeletal: There is a comminuted fracture of the RIGHT scapula; please see separately dictated report regarding CT shoulder findings. There is a nondisplaced fracture of the RIGHT anterior second rib, RIGHT lateral third rib, RIGHT lateral fourth rib. There is a mildly displaced fracture of the RIGHT lateral fifth rib IMPRESSION: 1. There are rib fractures involving  the second through fifth RIGHT ribs. No  pneumothorax or pleural effusion. 2. Comminuted RIGHT scapular fracture. Please see separately dictated report regarding RIGHT shoulder findings. Aortic Atherosclerosis (ICD10-I70.0). Electronically Signed   By: Valentino Saxon M.D.   On: 05/14/2021 14:39   CT Cervical Spine Wo Contrast  Result Date: 05/14/2021 CLINICAL DATA:  Head trauma, moderate/severe. Neck trauma, midline tenderness. Additional history provided: Patient reports headache and neck pain status post fall, history of multiple sclerosis. EXAM: CT HEAD WITHOUT CONTRAST CT CERVICAL SPINE WITHOUT CONTRAST TECHNIQUE: Multidetector CT imaging of the head and cervical spine was performed following the standard protocol without intravenous contrast. Multiplanar CT image reconstructions of the cervical spine were also generated. COMPARISON:  Prior head CT examinations 11/07/2015 and earlier. Brain MRI 07/11/2015. Cervical spine MRI 10/08/2014. FINDINGS: CT HEAD FINDINGS Brain: Mildly motion degraded exam. Mild generalized cerebral and cerebellar atrophy. Patchy and ill-defined hypoattenuation within the cerebral white matter, progressed from the head CT of 11/07/2015. The 1.2 cm right-sided pituitary lesion demonstrated on the prior brain MRI of 07/11/2015 is poorly reassessed on this noncontrast head CT. There is no acute intracranial hemorrhage. No demarcated cortical infarct. No extra-axial fluid collection. No midline shift. Vascular: No hyperdense vessel.  Atherosclerotic calcifications. Skull: Normal. Negative for fracture or focal lesion. Sinuses/Orbits: Visualized orbits show no acute finding. No significant paranasal sinus disease. CT CERVICAL SPINE FINDINGS Alignment: Mild reversal of the expected cervical lordosis. Chronic 2 mm trace C7-T1 grade 1 anterolisthesis. Partially imaged thoracic levocurvature. Skull base and vertebrae: The basion-dental and atlanto-dental intervals are maintained.No evidence of acute fracture to the cervical  spine. Soft tissues and spinal canal: No prevertebral fluid or swelling. No visible canal hematoma. Disc levels: Cervical spondylosis with multilevel disc space narrowing, disc bulges/central disc protrusions, posterior disc osteophytes, endplate spurring and uncovertebral hypertrophy. No appreciable high-grade spinal canal stenosis. Multilevel bony neural foraminal narrowing. Multilevel ventral osteophytes. Upper chest: No consolidation within the imaged lung apices. No visible pneumothorax. IMPRESSION: CT head: 1. Mildly motion degraded exam. 2. No evidence of acute intracranial abnormality. 3. The 1.2 cm lesion within the right pituitary gland demonstrated on the prior brain MRI of 07/11/2015 is poorly reassessed on this non-contrast head CT. 4. Cerebral white matter disease, compatible with the provided clinical history of multiple sclerosis and progressed from the prior head CT of 05/14/2021. 5. Mild generalized parenchymal atrophy. CT cervical spine: 1. No evidence of acute fracture to the cervical spine. 2. Reversal of the expected cervical lordosis. 3. Chronic 2 mm C7-T1 grade 1 anterolisthesis. 4. Cervical spondylosis, as described. Electronically Signed   By: Kellie Simmering D.O.   On: 05/14/2021 12:50   CT Shoulder Right Wo Contrast  Result Date: 05/14/2021 CLINICAL DATA:  Fracture, shoulder EXAM: CT OF THE UPPER RIGHT EXTREMITY WITHOUT CONTRAST TECHNIQUE: Multidetector CT imaging of the upper right extremity was performed according to the standard protocol. COMPARISON:  Same day radiograph FINDINGS: Bones/Joint/Cartilage There is a comminuted displaced scapular body fracture. There is full width displacement anteriorly up to 1.0 cm. There is a nondisplaced right lateral third rib fracture and minimally displaced right lateral fourth and fifth rib fractures. Moderate to severe glenohumeral osteoarthritis with bulky osteophyte formation. There is no proximal humerus fracture. Ligaments Suboptimally  assessed by CT. Muscles and Tendons No significant muscle atrophy. No intramuscular collection on noncontrast CT. Soft tissues Adjacent soft tissue swelling. No focal fluid collection on noncontrast CT. IMPRESSION: Comminuted and displaced scapular body fracture. Nondisplaced right lateral third rib fracture and  minimally displaced right lateral fourth and fifth rib fractures. Moderate-severe glenohumeral osteoarthritis with bulky osteophyte formation. Electronically Signed   By: Maurine Simmering M.D.   On: 05/14/2021 14:38    Procedures Procedures   Medications Ordered in ED Medications  HYDROcodone-acetaminophen (NORCO/VICODIN) 5-325 MG per tablet 1 tablet (1 tablet Oral Given 05/14/21 1233)    ED Course  I have reviewed the triage vital signs and the nursing notes.  Pertinent labs & imaging results that were available during my care of the patient were reviewed by me and considered in my medical decision making (see chart for details).    MDM Rules/Calculators/A&P                           Patient presenting today after she was dropped at her facility.  Her biggest complaint is of right shoulder pain.  She is also having a headache and some mild neck pain as her head did hit the floor.  She denies any pain in her lower extremities.  Patient is bedbound due to severe MS.  She otherwise was feeling normal today before they dropped her.  She received tramadol with only minimal improvement in her pain.  She was given further pain medication.  Imaging is pending.  Patient is not anticoagulated at this time.  2:45 PM Patient's right shoulder film shows concern for scapular fracture as well as rib fractures.  Head CT and cervical images are negative for acute injury.  CT to further evaluate was done which showed a comminuted and displaced scapular body fracture nondisplaced right lateral third rib fracture and minutes minimally displaced right lateral fourth and fifth rib fractures.  Humerus appears  intact.  Patient sats remain 99% on room air.  Will discuss with orthopedics.  3:15 PM Spoke with Dr. Thea Alken who recommends a sling for comfort and f/u in 1-2 weeks.  Findings discussed with the patient.  She was given additional pain control.  Will be transferred back to her facility.  MDM   Amount and/or Complexity of Data Reviewed Tests in the radiology section of CPT: ordered and reviewed Independent visualization of images, tracings, or specimens: yes     Final Clinical Impression(s) / ED Diagnoses Final diagnoses:  Fall, initial encounter  Closed fracture of multiple ribs of right side, initial encounter  Closed displaced fracture of body of right scapula, initial encounter    Rx / DC Orders ED Discharge Orders          Ordered    oxyCODONE-acetaminophen (PERCOCET/ROXICET) 5-325 MG tablet  Every 6 hours PRN        05/14/21 1509             Blanchie Dessert, MD 05/14/21 1516

## 2021-05-14 NOTE — Discharge Instructions (Signed)
Today you were found to have a broken shoulder blade and 4 rib fractures.  This is from where they dropped you on the floor.  You need to wear the sling for comfort.  There is nothing surgical that needs to be done about these broken bones but we just have to give them time to heal.  Take the pain medication as needed but do not take Percocet and tramadol at the same time.

## 2021-05-14 NOTE — Progress Notes (Signed)
Reviewed case with Dr. 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  Plan for nonsurgical management.  She has advanced MS, is wheelchair-bound, and unfortunately this is her primary functional upper extremity.  She can use her fingers wrist and hand, and can even use her shoulder if she can tolerate, but I doubt she will be able to do much.  Plan for nonsurgical management and follow-up in the office in 1 to 2 weeks with me.  Marchia Bond, MD

## 2021-05-14 NOTE — ED Triage Notes (Signed)
Patient BIBA from [place]. Per EMS patient was in [place] lift at a height of 4-5 ft in the air and fell out of the sling. Patient fell on R shoulder and hit head. Patient denies loss of consciousness.  Per EMS no palpable deformity in R shoulder was felt. Patient has L sided paralysis d/t several strokes.  AoX4 BP: 122/90 HR: 77 SPO2: 97 on RA CBG: 128

## 2022-01-05 ENCOUNTER — Encounter (HOSPITAL_COMMUNITY): Payer: Self-pay | Admitting: Emergency Medicine

## 2022-01-05 ENCOUNTER — Emergency Department (HOSPITAL_COMMUNITY)
Admission: EM | Admit: 2022-01-05 | Discharge: 2022-01-05 | Disposition: A | Discharge status: Skilled Nursing Facility | Payer: 59 | Attending: Emergency Medicine | Admitting: Emergency Medicine

## 2022-01-05 ENCOUNTER — Emergency Department (HOSPITAL_COMMUNITY): Payer: 59

## 2022-01-05 ENCOUNTER — Other Ambulatory Visit: Payer: Self-pay

## 2022-01-05 DIAGNOSIS — T83198A Other mechanical complication of other urinary devices and implants, initial encounter: Secondary | ICD-10-CM | POA: Diagnosis present

## 2022-01-05 DIAGNOSIS — Y732 Prosthetic and other implants, materials and accessory gastroenterology and urology devices associated with adverse incidents: Secondary | ICD-10-CM | POA: Insufficient documentation

## 2022-01-05 DIAGNOSIS — N3000 Acute cystitis without hematuria: Secondary | ICD-10-CM

## 2022-01-05 DIAGNOSIS — Z79899 Other long term (current) drug therapy: Secondary | ICD-10-CM | POA: Insufficient documentation

## 2022-01-05 LAB — CBC WITH DIFFERENTIAL/PLATELET
Abs Immature Granulocytes: 0.02 10*3/uL (ref 0.00–0.07)
Basophils Absolute: 0.1 10*3/uL (ref 0.0–0.1)
Basophils Relative: 1 %
Eosinophils Absolute: 0.5 10*3/uL (ref 0.0–0.5)
Eosinophils Relative: 7 %
HCT: 37.6 % (ref 36.0–46.0)
Hemoglobin: 12.3 g/dL (ref 12.0–15.0)
Immature Granulocytes: 0 %
Lymphocytes Relative: 19 %
Lymphs Abs: 1.4 10*3/uL (ref 0.7–4.0)
MCH: 28.5 pg (ref 26.0–34.0)
MCHC: 32.7 g/dL (ref 30.0–36.0)
MCV: 87 fL (ref 80.0–100.0)
Monocytes Absolute: 0.5 10*3/uL (ref 0.1–1.0)
Monocytes Relative: 6 %
Neutro Abs: 4.7 10*3/uL (ref 1.7–7.7)
Neutrophils Relative %: 67 %
Platelets: 279 10*3/uL (ref 150–400)
RBC: 4.32 MIL/uL (ref 3.87–5.11)
RDW: 14.4 % (ref 11.5–15.5)
WBC: 7.2 10*3/uL (ref 4.0–10.5)
nRBC: 0 % (ref 0.0–0.2)

## 2022-01-05 LAB — URINALYSIS, ROUTINE W REFLEX MICROSCOPIC
Bilirubin Urine: NEGATIVE
Glucose, UA: NEGATIVE mg/dL
Hgb urine dipstick: NEGATIVE
Ketones, ur: NEGATIVE mg/dL
Nitrite: POSITIVE — AB
Protein, ur: 30 mg/dL — AB
Specific Gravity, Urine: 1.011 (ref 1.005–1.030)
pH: 9 — ABNORMAL HIGH (ref 5.0–8.0)

## 2022-01-05 LAB — BASIC METABOLIC PANEL
Anion gap: 7 (ref 5–15)
BUN: 22 mg/dL — ABNORMAL HIGH (ref 6–20)
CO2: 26 mmol/L (ref 22–32)
Calcium: 9.5 mg/dL (ref 8.9–10.3)
Chloride: 111 mmol/L (ref 98–111)
Creatinine, Ser: 0.96 mg/dL (ref 0.44–1.00)
GFR, Estimated: >60 mL/min (ref >60)
Glucose, Bld: 151 mg/dL — ABNORMAL HIGH (ref 70–99)
Potassium: 4.3 mmol/L (ref 3.5–5.1)
Sodium: 144 mmol/L (ref 135–145)

## 2022-01-05 LAB — CBG MONITORING, ED: Glucose-Capillary: 91 mg/dL (ref 70–99)

## 2022-01-05 MED ORDER — LACTATED RINGERS IV SOLN: INTRAVENOUS | Status: DC

## 2022-01-05 MED ORDER — TRAMADOL HCL 50 MG PO TABS
50.0000 mg | ORAL_TABLET | Freq: Once | ORAL | Status: AC
  Administered 2022-01-05: 50 mg via ORAL
  Filled 2022-01-05: qty 1

## 2022-01-05 MED ORDER — CEFDINIR 300 MG PO CAPS: 300.0000 mg | ORAL_CAPSULE | Freq: Two times a day (BID) | ORAL | 0 refills | Status: AC

## 2022-01-05 NOTE — ED Triage Notes (Signed)
Pt arrives via [facility] for suprapubic tube dislodgement from [place]. Pt states that she feels like it has been dislodged for greater than one week, however urine is noted in tube and bag. Pt is bed bound at baseline.

## 2022-01-05 NOTE — ED Provider Notes (Signed)
[facility] DEPT Provider Note   CSN: [id number] Arrival date & time: 01/05/22  1134     History  Chief Complaint  Patient presents with   Suprapubic Tube Problem    Rayna Brenner is a 61 y.o. female.  61 year old female who presents with concern for leaking suprapubic catheter.  Patient has had leaking around her skin site.  Still has good flow into her external bag.  She also notes that she has had urine into her brief.  Denies any fever or chills.  Symptoms have been present for the past 5 days or so.       Home Medications Prior to Admission medications   Medication Sig Start Date End Date Taking? Authorizing Provider  acetaminophen (TYLENOL) 325 MG tablet Take 650 mg by mouth every 8 (eight) hours as needed for mild pain, moderate pain or headache.     [provider]  albuterol (PROVENTIL) (2.5 MG/3ML) 0.083% nebulizer solution Take 2.5 mg by nebulization every 6 (six) hours as needed for wheezing or shortness of breath.    [provider]  albuterol (VENTOLIN HFA) 108 (90 Base) MCG/ACT inhaler Inhale 2 puffs into the lungs every 6 (six) hours as needed for wheezing or shortness of breath.  07/10/19   [provider]  amantadine (SYMMETREL) 100 MG capsule Take 100 mg by mouth daily.     [provider]  ascorbic acid (VITAMIN C) 500 MG tablet Take 500 mg by mouth daily.     [provider]  aspirin 325 MG EC tablet Take 325 mg by mouth daily.    [provider]  azelastine (ASTELIN) 0.1 % nasal spray Place 2 sprays into both nostrils 2 (two) times daily.    [provider]  AZO-CRANBERRY PO Take 2 capsules by mouth daily.    [provider]  baclofen (LIORESAL) 10 MG tablet Take 5 mg by mouth 4 (four) times daily. Hold if lethargic or confused    [provider]  bisacodyl (DULCOLAX) 10 MG suppository Place 10 mg rectally daily as needed for moderate constipation. If no  results for MOM    [provider]  buPROPion (WELLBUTRIN XL) 150 MG 24 hr tablet Take 150 mg by mouth daily.    [provider]  cefTRIAXone (ROCEPHIN) 1 g injection Inject 1 g into the muscle once. Patient not taking: No sig reported    [provider]  cephALEXin (KEFLEX) 250 MG capsule Take 250 mg by mouth 3 (three) times daily. For 5 days Patient not taking: No sig reported    [provider]  DULoxetine (CYMBALTA) 30 MG capsule Take 30 mg by mouth daily.    [provider]  Ensure (ENSURE) Take 237 mLs by mouth 3 (three) times daily.    [provider]  ferrous sulfate 325 (65 FE) MG tablet Take 325 mg by mouth 2 (two) times daily with a meal.  02/20/18   [provider]  fluticasone (FLONASE) 50 MCG/ACT nasal spray Place 2 sprays into both nostrils at bedtime.    [provider]  gabapentin (NEURONTIN) 800 MG tablet Take 800 mg by mouth 3 (three) times daily.    [provider]  insulin aspart (NOVOLOG FLEXPEN) 100 UNIT/ML FlexPen 0-15 Units, Subcutaneous, 3 times daily with meals CBG < 70: implement hypoglycemia protocol-call MD CBG 70 - 120: 0 units CBG 121 - 150: 2 units CBG 151 - 200: 3 units CBG 201 - 250: 5  units CBG 251 - 300: 8 units CBG 301 - 350: 11 units CBG 351 - 400: 15 units CBG > 400: Patient taking differently: Inject 0-15 Units into the skin 3 (three) times daily with meals. 0-15 Units, Subcutaneous, 3 times daily with meals per sliding scale CBG < 70: implement hypoglycemia protocol-call MD CBG 0- 120: 0 units CBG 121 - 150: 2 units CBG 151 - 200: 3 units CBG 201 - 250: 5 units CBG 251 - 300: 8 units CBG 301 - 350: 11 units CBG 351 - 400: 15 units CBG >400, CALL MD 05/18/19   Jonetta Osgood, MD  LANTUS SOLOSTAR 100 UNIT/ML Solostar Pen Inject 5 Units into the skin daily. 04/23/20   [provider]  latanoprost (XALATAN) 0.005 % ophthalmic solution Place 1 drop into  both eyes at bedtime.    [provider]  linaclotide (LINZESS) 290 MCG CAPS capsule Take 290 mcg by mouth daily before breakfast.    [provider]  Melatonin 5 MG TABS Take 10 mg by mouth at bedtime.  11/14/17   [provider]  metoprolol succinate (TOPROL-XL) 100 MG 24 hr tablet Take 150 mg by mouth daily. Take with or immediately following a meal.    [provider]  Multiple Vitamins-Minerals (MULTIVITAMIN ADULT PO) Take 1 tablet by mouth daily.    [provider]  ondansetron (ZOFRAN) 4 MG tablet Take 4 mg by mouth every 6 (six) hours as needed for nausea or vomiting. 11/01/17   [provider]  oxyCODONE-acetaminophen (PERCOCET/ROXICET) 5-325 MG tablet Take 1 tablet by mouth every 6 (six) hours as needed for severe pain. 05/14/21   Blanchie Dessert, MD  polyethylene glycol (MIRALAX MIX-IN PAX) 17 g packet Take 17 g by mouth daily. 05/21/20   Donne Hazel, MD  Potassium Chloride ER 20 MEQ TBCR Take 1 tablet by mouth daily. 05/17/20   [provider]  Probiotic Product (PROBIOTIC PO) Take 1 capsule by mouth 2 (two) times daily.    [provider]  senna-docusate (SENOKOT-S) 8.6-50 MG tablet Take 2 tablets by mouth 2 (two) times daily.    [provider]  simethicone (MYLICON) 664 MG chewable tablet Chew 125 mg by mouth every 6 (six) hours as needed for flatulence.    [provider]  spironolactone (ALDACTONE) 25 MG tablet Take 25 mg by mouth daily. 05/02/20   [provider]  traMADol (ULTRAM) 50 MG tablet Take 100 mg by mouth every 6 (six) hours as needed (Pain).    [provider]      Allergies    Sulfonamide derivatives and Penicillins    Review of Systems   Review of Systems  All other systems reviewed and are negative.   Physical Exam Updated Vital Signs BP (!) 156/94   Pulse 97   Temp 98 F (36.7 C) (Oral)   Resp 18   SpO2 99%  Physical Exam Vitals and nursing  note reviewed.  Constitutional:      General: She is not in acute distress.    Appearance: Normal appearance. She is well-developed. She is not toxic-appearing.  HENT:     Head: Normocephalic and atraumatic.  Eyes:     General: Lids are normal.     Conjunctiva/sclera: Conjunctivae normal.     Pupils: Pupils are equal, round, and reactive to light.  Neck:     Thyroid: No thyroid mass.     Trachea: No tracheal deviation.  Cardiovascular:  Rate and Rhythm: Normal rate and regular rhythm.     Heart sounds: Normal heart sounds. No murmur heard.    No gallop.  Pulmonary:     Effort: Pulmonary effort is normal. No respiratory distress.     Breath sounds: Normal breath sounds. No stridor. No decreased breath sounds, wheezing, rhonchi or rales.  Abdominal:     General: There is no distension.     Palpations: Abdomen is soft.     Tenderness: There is no abdominal tenderness. There is no rebound.    Musculoskeletal:        General: No tenderness. Normal range of motion.     Cervical back: Normal range of motion and neck supple.  Skin:    General: Skin is warm and dry.     Findings: No abrasion or rash.  Neurological:     Mental Status: She is alert and oriented to person, place, and time. Mental status is at baseline.     GCS: GCS eye subscore is 4. GCS verbal subscore is 5. GCS motor subscore is 6.     Cranial Nerves: Cranial nerves 2-12 are intact. No cranial nerve deficit.     Comments: At baseline  Psychiatric:        Attention and Perception: Attention normal.        Speech: Speech normal.        Behavior: Behavior normal.     ED Results / Procedures / Treatments   Labs (all labs ordered are listed, but only abnormal results are displayed) Labs Reviewed  CBC WITH DIFFERENTIAL/PLATELET  BASIC METABOLIC PANEL  URINALYSIS, ROUTINE W REFLEX MICROSCOPIC    EKG None  Radiology No results found.  Procedures Procedures    Medications Ordered in ED Medications   lactated ringers infusion (has no administration in time range)    ED Course/ Medical Decision Making/ A&P                           Medical Decision Making Amount and/or Complexity of Data Reviewed Labs: ordered. Radiology: ordered.  Risk Prescription drug management.   Patient CBC shows normal white blood cell count.  Electrolytes are normal.  Patient's urinalysis does show infection.  Abdominal CT per my interpretation shows good placement of her suprapubic tube.  It also confirms UTI seen in her urinalysis.  Plan will be to place patient on antibiotics and send her back to the facility.        Final Clinical Impression(s) / ED Diagnoses Final diagnoses:  None    Rx / DC Orders ED Discharge Orders     None         Lacretia Leigh, MD 01/05/22 [phone]

## 2022-01-05 NOTE — ED Notes (Signed)
Called [facility] to transport this patient back home to [facility], per [place], Therapist, sports.

## 2022-01-05 NOTE — Discharge Instructions (Addendum)
Your catheter is working appropriately at this time and is draining urine.  Follow-up with your doctor as needed

## 2022-04-30 ENCOUNTER — Other Ambulatory Visit: Payer: Self-pay | Admitting: Internal Medicine

## 2022-04-30 ENCOUNTER — Other Ambulatory Visit (HOSPITAL_COMMUNITY): Payer: Self-pay | Admitting: Internal Medicine

## 2022-04-30 DIAGNOSIS — R918 Other nonspecific abnormal finding of lung field: Secondary | ICD-10-CM

## 2022-05-01 ENCOUNTER — Other Ambulatory Visit: Payer: Self-pay

## 2022-05-01 ENCOUNTER — Emergency Department (HOSPITAL_COMMUNITY)
Admission: EM | Admit: 2022-05-01 | Discharge: 2022-05-02 | Disposition: A | Discharge status: Home / Self Care | Payer: 59 | Attending: Emergency Medicine | Admitting: Emergency Medicine

## 2022-05-01 ENCOUNTER — Other Ambulatory Visit (HOSPITAL_COMMUNITY): Payer: Self-pay | Admitting: Internal Medicine

## 2022-05-01 ENCOUNTER — Emergency Department (HOSPITAL_COMMUNITY): Payer: 59

## 2022-05-01 ENCOUNTER — Encounter (HOSPITAL_COMMUNITY): Payer: Self-pay | Admitting: Emergency Medicine

## 2022-05-01 DIAGNOSIS — Z7982 Long term (current) use of aspirin: Secondary | ICD-10-CM | POA: Insufficient documentation

## 2022-05-01 DIAGNOSIS — I509 Heart failure, unspecified: Secondary | ICD-10-CM | POA: Insufficient documentation

## 2022-05-01 DIAGNOSIS — R0602 Shortness of breath: Secondary | ICD-10-CM | POA: Insufficient documentation

## 2022-05-01 DIAGNOSIS — Z794 Long term (current) use of insulin: Secondary | ICD-10-CM | POA: Diagnosis not present

## 2022-05-01 DIAGNOSIS — R600 Localized edema: Secondary | ICD-10-CM | POA: Insufficient documentation

## 2022-05-01 DIAGNOSIS — I110 Hypertensive heart disease with heart failure: Secondary | ICD-10-CM | POA: Insufficient documentation

## 2022-05-01 DIAGNOSIS — R918 Other nonspecific abnormal finding of lung field: Secondary | ICD-10-CM

## 2022-05-01 DIAGNOSIS — Z79899 Other long term (current) drug therapy: Secondary | ICD-10-CM | POA: Insufficient documentation

## 2022-05-01 LAB — CBC WITH DIFFERENTIAL/PLATELET
Abs Immature Granulocytes: 0.02 10*3/uL (ref 0.00–0.07)
Basophils Absolute: 0.1 10*3/uL (ref 0.0–0.1)
Basophils Relative: 1 %
Eosinophils Absolute: 0.5 10*3/uL (ref 0.0–0.5)
Eosinophils Relative: 10 %
HCT: 35.2 % — ABNORMAL LOW (ref 36.0–46.0)
Hemoglobin: 11.2 g/dL — ABNORMAL LOW (ref 12.0–15.0)
Immature Granulocytes: 0 %
Lymphocytes Relative: 30 %
Lymphs Abs: 1.6 10*3/uL (ref 0.7–4.0)
MCH: 28.9 pg (ref 26.0–34.0)
MCHC: 31.8 g/dL (ref 30.0–36.0)
MCV: 91 fL (ref 80.0–100.0)
Monocytes Absolute: 0.4 10*3/uL (ref 0.1–1.0)
Monocytes Relative: 7 %
Neutro Abs: 2.7 10*3/uL (ref 1.7–7.7)
Neutrophils Relative %: 52 %
Platelets: 243 10*3/uL (ref 150–400)
RBC: 3.87 MIL/uL (ref 3.87–5.11)
RDW: 14.3 % (ref 11.5–15.5)
WBC: 5.2 10*3/uL (ref 4.0–10.5)
nRBC: 0 % (ref 0.0–0.2)

## 2022-05-01 LAB — BASIC METABOLIC PANEL
Anion gap: 7 (ref 5–15)
BUN: 14 mg/dL (ref 6–20)
CO2: 29 mmol/L (ref 22–32)
Calcium: 8.7 mg/dL — ABNORMAL LOW (ref 8.9–10.3)
Chloride: 110 mmol/L (ref 98–111)
Creatinine, Ser: 1.02 mg/dL — ABNORMAL HIGH (ref 0.44–1.00)
GFR, Estimated: >60 mL/min (ref >60)
Glucose, Bld: 115 mg/dL — ABNORMAL HIGH (ref 70–99)
Potassium: 4.3 mmol/L (ref 3.5–5.1)
Sodium: 146 mmol/L — ABNORMAL HIGH (ref 135–145)

## 2022-05-01 LAB — TROPONIN I (HIGH SENSITIVITY): Troponin I (High Sensitivity): 15 ng/L (ref <18)

## 2022-05-01 LAB — D-DIMER, QUANTITATIVE: D-Dimer, Quant: 2.03 ug/mL-FEU — ABNORMAL HIGH (ref 0.00–0.50)

## 2022-05-01 LAB — BRAIN NATRIURETIC PEPTIDE: B Natriuretic Peptide: 1272.9 pg/mL — ABNORMAL HIGH (ref 0.0–100.0)

## 2022-05-01 MED ORDER — DOXYCYCLINE HYCLATE 100 MG PO TABS
100.0000 mg | ORAL_TABLET | Freq: Once | ORAL | Status: AC
  Administered 2022-05-01: 100 mg via ORAL
  Filled 2022-05-01: qty 1

## 2022-05-01 MED ORDER — IOHEXOL 350 MG/ML SOLN
100.0000 mL | Freq: Once | INTRAVENOUS | Status: AC | PRN
  Administered 2022-05-01: 100 mL via INTRAVENOUS

## 2022-05-01 MED ORDER — ALBUTEROL SULFATE HFA 108 (90 BASE) MCG/ACT IN AERS
2.0000 | INHALATION_SPRAY | RESPIRATORY_TRACT | Status: DC | PRN
  Administered 2022-05-01: 2 via RESPIRATORY_TRACT
  Filled 2022-05-01 (×2): qty 6.7

## 2022-05-01 MED ORDER — DOXYCYCLINE HYCLATE 100 MG PO CAPS: 100.0000 mg | ORAL_CAPSULE | Freq: Two times a day (BID) | ORAL | 0 refills | Status: DC

## 2022-05-01 MED ORDER — FUROSEMIDE 10 MG/ML IJ SOLN
20.0000 mg | Freq: Once | INTRAMUSCULAR | Status: AC
  Administered 2022-05-01: 20 mg via INTRAVENOUS
  Filled 2022-05-01: qty 4

## 2022-05-01 NOTE — ED Provider Notes (Signed)
Signout from Dr. Maylon Peppers.  61 year old female with history of MS paraplegia CHF and recent sleeve found to have pulmonary nodules and new oxygen requirement here with increased shortness of breath.  She is pending a CT PE study.  Disposition per results of work-up. Physical Exam  BP 123/81 (BP Location: Left Arm)   Pulse 90   Temp 98.1 F (36.7 C) (Oral)   Resp 18   SpO2 96%   Physical Exam  Procedures  Procedures  ED Course / MDM   Clinical Course as of 05/01/22 1718  Tue May 01, 2022  1715 Patient signed out to Dr. Melina Copa in stable condition pending CTPE. [VK]    Clinical Course User Index [VK] Kemper Durie, DO   Medical Decision Making Amount and/or Complexity of Data Reviewed Labs: ordered. Radiology: ordered.  Risk Prescription drug management.   Patient CT does not show any large PE.  They also do not show any pulmonary nodules or anything that sounds suspicious for cancer.  She does have some mosaic pattern question asthma versus bronchiectasis, small effusions.  I reviewed this all with the patient.  She said she feels well enough to go home.  She is on her baseline oxygen.  We will give her a dose of diuretic here and start her on some antibiotics.  It also sounds like she should follow-up with pulmonology.  We will put in a referral.       Hayden Rasmussen, MD 05/01/22 1910

## 2022-05-01 NOTE — Discharge Instructions (Signed)
You are seen in the emergency department for evaluation of shortness of breath.  You had a CAT scan that did not show any evidence of blood clot.  There was also no clear evidence of any lung cancer and nodules.  He did had some findings that will need follow-up with a lung specialist and we have put a referral in for the lung doctor.  We also starting you on some antibiotics.  You did have some element of heart failure so it will be important for you to continue a low-salt diet and watch your fluid intake.  Return to the emergency department if any worsening or concerning symptoms.

## 2022-05-01 NOTE — ED Notes (Signed)
Pt in bed, pt states that she doesn't need an inhaler at this time.

## 2022-05-01 NOTE — ED Provider Notes (Signed)
[facility] DEPT Provider Note   CSN: [id number] Arrival date & time: 05/01/22  1404     History  Chief Complaint  Patient presents with   Shortness of Breath    Shannon Garrett is a 61 y.o. female.  Patient is a 61 year old female with past medical history of MS with paraplegia, CHF, hypertension presenting to the emergency department with shortness of breath.  Patient states over the last 3 weeks she has had increasing shortness of breath as well as night sweats.  She states that she was started on home oxygen by her primary doctor and had an x-ray that showed 2 nodules in her lung suspicious for cancer.  She states that she has not had any biopsy or further work-up but would like a second opinion.  She denies any chest pain.  She states that she has had lower extremity swelling and feels "bloated" all over.  She denies any fevers or cough, runny nose or sore throat, nausea, vomiting or diarrhea.  She denies any history of blood clots, recent hospitalizations or surgeries, recent long travels in the car plane or blood thinner use.  The history is provided by the patient.  Shortness of Breath      Home Medications Prior to Admission medications   Medication Sig Start Date End Date Taking? Authorizing Provider  acetaminophen (TYLENOL) 325 MG tablet Take 650 mg by mouth every 8 (eight) hours as needed (pain, fever > 100.5).   Yes [provider]  amantadine (SYMMETREL) 100 MG capsule Take 100 mg by mouth in the morning.   Yes [provider]  ascorbic acid (VITAMIN C) 500 MG tablet Take 500 mg by mouth in the morning.   Yes [provider]  aspirin 325 MG EC tablet Take 325 mg by mouth in the morning.   Yes [provider]  AZO-CRANBERRY PO Take 2 each by mouth in the morning.   Yes [provider]  baclofen (LIORESAL) 10 MG tablet Take 10 mg by mouth 3 (three) times daily. Hold if lethargic or confused   Yes  [provider]  buPROPion (WELLBUTRIN XL) 150 MG 24 hr tablet Take 150 mg by mouth in the morning.   Yes [provider]  docusate sodium (COLACE) 100 MG capsule Take 100 mg by mouth 2 (two) times daily.   Yes [provider]  DULoxetine (CYMBALTA) 20 MG capsule Take 60 mg by mouth in the morning.   Yes [provider]  ferrous sulfate 325 (65 FE) MG tablet Take 325 mg by mouth 2 (two) times daily with a meal.  02/20/18  Yes [provider]  fluticasone (FLONASE) 50 MCG/ACT nasal spray Place 1 spray into both nostrils at bedtime.   Yes [provider]  furosemide (LASIX) 20 MG tablet Take 20 mg by mouth in the morning.   Yes [provider]  gabapentin (NEURONTIN) 800 MG tablet Take 800 mg by mouth 3 (three) times daily.   Yes [provider]  insulin aspart (NOVOLOG FLEXPEN) 100 UNIT/ML FlexPen 0-15 Units, Subcutaneous, 3 times daily with meals CBG < 70: implement hypoglycemia protocol-call MD CBG 70 - 120: 0 units CBG 121 - 150: 2 units CBG 151 - 200: 3 units CBG 201 - 250: 5 units CBG 251 - 300: 8 units CBG 301 - 350: 11 units CBG 351 - 400: 15 units CBG > 400: Patient taking differently: Inject 0-15 Units into the skin 3 (three) times daily with  meals. 0-15 Units, Subcutaneous, 3 times daily with meals per sliding scale : CBG < 70: implement hypoglycemia protocol-call MD CBG 0 - 120 : 0 units CBG 121 - 150: 2 units CBG 151 - 200: 3 units CBG 201 - 250: 5 units CBG 251 - 300: 8 units CBG 301 - 350: 11 units CBG 351 - 400: 15 units CBG >400, CALL MD 05/18/19  Yes Ghimire, Henreitta Leber, MD  latanoprost (XALATAN) 0.005 % ophthalmic solution Place 1 drop into both eyes at bedtime.   Yes [provider]  linaclotide (LINZESS) 290 MCG CAPS capsule Take 290 mcg by mouth daily before breakfast.   Yes [provider]  Melatonin 5 MG TABS Take 10 mg by mouth at bedtime.  11/14/17  Yes [provider]   metoprolol succinate (TOPROL-XL) 100 MG 24 hr tablet Take 150 mg by mouth in the morning.   Yes [provider]  Multiple Vitamins-Minerals (MULTIVITAMIN ADULT PO) Take 1 tablet by mouth in the morning.   Yes [provider]  NONFORMULARY OR COMPOUNDED ITEM Apply 1 application  topically 3 (three) times daily. A&D/Balmex/Lidocaine 1:1:1. Apply to vulva 3 times daily.   Yes [provider]  nystatin (MYCOSTATIN/NYSTOP) powder Apply 1 Application topically 2 (two) times daily. To vagina, armpits   Yes [provider]  polyethylene glycol (MIRALAX MIX-IN PAX) 17 g packet Take 17 g by mouth daily. Patient taking differently: Take 17 g by mouth in the morning. 05/21/20  Yes Donne Hazel, MD  Potassium Chloride ER 20 MEQ TBCR Take 20 mEq by mouth in the morning. 05/17/20  Yes [provider]  Probiotic Product (PROBIOTIC PO) Take 1 capsule by mouth 2 (two) times daily.   Yes [provider]  senna-docusate (SENOKOT-S) 8.6-50 MG tablet Take 2 tablets by mouth 2 (two) times daily.   Yes [provider]  simvastatin (ZOCOR) 10 MG tablet Take 10 mg by mouth at bedtime.   Yes [provider]  spironolactone (ALDACTONE) 25 MG tablet Take 25 mg by mouth in the morning. 05/02/20  Yes [provider]  traMADol (ULTRAM) 50 MG tablet Take 100 mg by mouth 2 (two) times daily as needed (pain).   Yes [provider]  albuterol (PROVENTIL) (2.5 MG/3ML) 0.083% nebulizer solution Take 2.5 mg by nebulization every 6 (six) hours as needed for wheezing or shortness of breath.    [provider]  albuterol (VENTOLIN HFA) 108 (90 Base) MCG/ACT inhaler Inhale 2 puffs into the lungs every 6 (six) hours as needed for wheezing or shortness of breath.  07/10/19   [provider]  azelastine (ASTELIN) 0.1 % nasal spray Place 2 sprays into both nostrils 2 (two) times daily.    [provider]  bisacodyl (DULCOLAX) 10  MG suppository Place 10 mg rectally daily as needed for moderate constipation. If no results for MOM    [provider]  cefTRIAXone (ROCEPHIN) 1 g injection Inject 1 g into the muscle once. Patient not taking: No sig reported    [provider]  cephALEXin (KEFLEX) 250 MG capsule Take 250 mg by mouth 3 (three) times daily. For 5 days Patient not taking: No sig reported    [provider]  Ensure (ENSURE) Take 237 mLs by mouth 3 (three) times daily.    [provider]  LANTUS SOLOSTAR 100 UNIT/ML Solostar Pen Inject 5 Units into the skin daily. 04/23/20   [provider]  ondansetron (ZOFRAN) 4 MG  tablet Take 4 mg by mouth every 6 (six) hours as needed for nausea or vomiting. 11/01/17   [provider]  oxyCODONE-acetaminophen (PERCOCET/ROXICET) 5-325 MG tablet Take 1 tablet by mouth every 6 (six) hours as needed for severe pain. 05/14/21   Blanchie Dessert, MD  simethicone (MYLICON) 993 MG chewable tablet Chew 125 mg by mouth every 6 (six) hours as needed for flatulence.    [provider]      Allergies    Sulfonamide derivatives and Penicillins    Review of Systems   Review of Systems  Respiratory:  Positive for shortness of breath.     Physical Exam Updated Vital Signs BP 123/81 (BP Location: Left Arm)   Pulse 90   Temp 98.1 F (36.7 C) (Oral)   Resp 18   SpO2 96%  Physical Exam Vitals and nursing note reviewed.  Constitutional:      General: She is not in acute distress.    Appearance: She is well-developed. She is obese.  HENT:     Head: Normocephalic and atraumatic.     Mouth/Throat:     Mouth: Mucous membranes are moist.  Eyes:     Extraocular Movements: Extraocular movements intact.  Cardiovascular:     Rate and Rhythm: Normal rate and regular rhythm.     Pulses: Normal pulses.     Heart sounds: Normal heart sounds.  Pulmonary:     Effort: Pulmonary effort is normal.  Abdominal:     Palpations:  Abdomen is soft.     Tenderness: There is no abdominal tenderness.  Musculoskeletal:     Cervical back: Normal range of motion and neck supple.     Right lower leg: Edema (1+ to mid shin) present.     Left lower leg: Edema (1+ to mid shin) present.     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  Neurological:     General: No focal deficit present.     Mental Status: She is alert and oriented to person, place, and time.  Psychiatric:        Mood and Affect: Mood normal.        Behavior: Behavior normal.     ED Results / Procedures / Treatments   Labs (all labs ordered are listed, but only abnormal results are displayed) Labs Reviewed  BRAIN NATRIURETIC PEPTIDE - Abnormal; Notable for the following components:      Result Value   B Natriuretic Peptide 1,272.9 (*)    All other components within normal limits  BASIC METABOLIC PANEL - Abnormal; Notable for the following components:   Sodium 146 (*)    Glucose, Bld 115 (*)    Creatinine, Ser 1.02 (*)    Calcium 8.7 (*)    All other components within normal limits  CBC WITH DIFFERENTIAL/PLATELET - Abnormal; Notable for the following components:   Hemoglobin 11.2 (*)    HCT 35.2 (*)    All other components within normal limits  D-DIMER, QUANTITATIVE - Abnormal; Notable for the following components:   D-Dimer, Quant 2.03 (*)    All other components within normal limits  TROPONIN I (HIGH SENSITIVITY)  TROPONIN I (HIGH SENSITIVITY)    EKG None  Radiology DG Chest 2 View  Result Date: 05/01/2022 CLINICAL DATA:  SHOB EXAM: CHEST - 2 VIEW COMPARISON:  Chest x-ray May 19, 2020. FINDINGS: Left basilar opacities. No visible pleural effusions or pneumothorax. Enlarged cardiac silhouette.  Pulmonary vascular congestion. IMPRESSION: 1. Left basilar atelectasis and/or consolidation. 2. Cardiomegaly and pulmonary vascular congestion. Electronically Signed   By: Margaretha Sheffield  M.D.   On: 05/01/2022 14:35    Procedures Procedures    Medications Ordered in ED Medications  albuterol (VENTOLIN HFA) 108 (90 Base) MCG/ACT inhaler 2 puff (has no administration in time range)    ED Course/ Medical Decision Making/ A&P Clinical Course as of 05/01/22 Regino Ramirez May 01, 2022  1715 Patient signed out to Dr. Melina Copa in stable condition pending CTPE. [VK]    Clinical Course User Index [VK] Kemper Durie, DO                           Medical Decision Making This patient presents to the ED with chief complaint(s) of shortness of breath with pertinent past medical history of MS with paraplegia, CHF and possibly newly diagnosed lung cancer which further complicates the presenting complaint. The complaint involves an extensive differential diagnosis and also carries with it a high risk of complications and morbidity.    The differential diagnosis includes lung cancer, pneumonia, PE, ACS, arrhythmia, anemia, CHF exacerbation, pleural effusion, pulmonary edema, pneumothorax  Additional history obtained: Additional history obtained from N/A Records reviewed N/A  ED Course and Reassessment: Patient was initially evaluated by triage and had chest x-ray performed that showed no obvious pulmonary nodule or explanation for her shortness of breath.  She does appear mildly volume overloaded and will have labs including BNP and troponin and EKG performed.  The patient is paraplegic and nonambulatory at baseline increasing her risk for PE, she has not had formed cancer, so D-dimer will be performed to evaluate for possible PE.  Independent labs interpretation:  The following labs were independently interpreted: Elevated D-dimer, BNP pending  Independent visualization of imaging: - I independently visualized the following imaging with scope of interpretation limited to determining acute life threatening conditions related to emergency care: Chest x-ray, which revealed no acute  disease     Amount and/or Complexity of Data Reviewed Labs: ordered. Radiology: ordered.  Risk Prescription drug management.          Final Clinical Impression(s) / ED Diagnoses Final diagnoses:  None    Rx / DC Orders ED Discharge Orders     None         Kemper Durie, DO 05/01/22 1717

## 2022-05-01 NOTE — ED Triage Notes (Signed)
BIBA Per EMS: Pt comes from [place] w/ c/o  recently diagnoses w/ lung CA. Reports increased SHOB over the past week. Pt reports she wants a 2nd opinion on her lung CA diagnosis & wants to know why she is more [facility]

## 2022-05-02 DIAGNOSIS — R0602 Shortness of breath: Secondary | ICD-10-CM | POA: Diagnosis not present

## 2022-05-02 MED ORDER — OXYCODONE-ACETAMINOPHEN 5-325 MG PO TABS
1.0000 | ORAL_TABLET | Freq: Once | ORAL | Status: AC
  Administered 2022-05-02: 1 via ORAL
  Filled 2022-05-02: qty 1

## 2022-05-10 ENCOUNTER — Ambulatory Visit (HOSPITAL_COMMUNITY)
Admission: RE | Admit: 2022-05-10 | Discharge: 2022-05-10 | Disposition: A | Discharge status: Home / Self Care | Payer: 59 | Source: Ambulatory Visit | Attending: Internal Medicine | Admitting: Internal Medicine

## 2022-05-10 DIAGNOSIS — R918 Other nonspecific abnormal finding of lung field: Secondary | ICD-10-CM | POA: Insufficient documentation

## 2022-05-10 MED ORDER — IOHEXOL 350 MG/ML SOLN
75.0000 mL | Freq: Once | INTRAVENOUS | Status: AC | PRN
  Administered 2022-05-10: 75 mL via INTRAVENOUS

## 2022-05-10 MED ORDER — IOHEXOL 350 MG/ML SOLN: 50.0000 mL | Freq: Once | INTRAVENOUS | Status: DC | PRN

## 2022-07-01 ENCOUNTER — Emergency Department (HOSPITAL_COMMUNITY): Payer: 59

## 2022-07-01 ENCOUNTER — Inpatient Hospital Stay (HOSPITAL_COMMUNITY)
Admission: EM | Admit: 2022-07-01 | Discharge: 2022-07-06 | DRG: 291 | Disposition: A | Discharge status: Skilled Nursing Facility | Payer: 59 | Source: Skilled Nursing Facility | Attending: Internal Medicine | Admitting: Internal Medicine

## 2022-07-01 ENCOUNTER — Encounter (HOSPITAL_COMMUNITY): Payer: Self-pay | Admitting: Internal Medicine

## 2022-07-01 ENCOUNTER — Other Ambulatory Visit: Payer: Self-pay

## 2022-07-01 DIAGNOSIS — Z9049 Acquired absence of other specified parts of digestive tract: Secondary | ICD-10-CM

## 2022-07-01 DIAGNOSIS — I10 Essential (primary) hypertension: Secondary | ICD-10-CM | POA: Diagnosis present

## 2022-07-01 DIAGNOSIS — R0602 Shortness of breath: Secondary | ICD-10-CM | POA: Diagnosis not present

## 2022-07-01 DIAGNOSIS — I5021 Acute systolic (congestive) heart failure: Secondary | ICD-10-CM | POA: Diagnosis not present

## 2022-07-01 DIAGNOSIS — E2740 Unspecified adrenocortical insufficiency: Secondary | ICD-10-CM | POA: Diagnosis present

## 2022-07-01 DIAGNOSIS — Z7401 Bed confinement status: Secondary | ICD-10-CM

## 2022-07-01 DIAGNOSIS — Z1152 Encounter for screening for COVID-19: Secondary | ICD-10-CM | POA: Diagnosis not present

## 2022-07-01 DIAGNOSIS — D638 Anemia in other chronic diseases classified elsewhere: Secondary | ICD-10-CM | POA: Diagnosis present

## 2022-07-01 DIAGNOSIS — J301 Allergic rhinitis due to pollen: Secondary | ICD-10-CM

## 2022-07-01 DIAGNOSIS — G9341 Metabolic encephalopathy: Secondary | ICD-10-CM | POA: Diagnosis present

## 2022-07-01 DIAGNOSIS — I700 Atherosclerosis of aorta: Secondary | ICD-10-CM | POA: Diagnosis present

## 2022-07-01 DIAGNOSIS — Z880 Allergy status to penicillin: Secondary | ICD-10-CM

## 2022-07-01 DIAGNOSIS — Z66 Do not resuscitate: Secondary | ICD-10-CM | POA: Diagnosis present

## 2022-07-01 DIAGNOSIS — I5043 Acute on chronic combined systolic (congestive) and diastolic (congestive) heart failure: Secondary | ICD-10-CM | POA: Diagnosis present

## 2022-07-01 DIAGNOSIS — I2489 Other forms of acute ischemic heart disease: Secondary | ICD-10-CM | POA: Diagnosis present

## 2022-07-01 DIAGNOSIS — Z79899 Other long term (current) drug therapy: Secondary | ICD-10-CM

## 2022-07-01 DIAGNOSIS — R609 Edema, unspecified: Secondary | ICD-10-CM | POA: Diagnosis not present

## 2022-07-01 DIAGNOSIS — G8929 Other chronic pain: Secondary | ICD-10-CM | POA: Diagnosis present

## 2022-07-01 DIAGNOSIS — F32A Depression, unspecified: Secondary | ICD-10-CM | POA: Diagnosis present

## 2022-07-01 DIAGNOSIS — F419 Anxiety disorder, unspecified: Secondary | ICD-10-CM | POA: Diagnosis present

## 2022-07-01 DIAGNOSIS — Z6836 Body mass index (BMI) 36.0-36.9, adult: Secondary | ICD-10-CM

## 2022-07-01 DIAGNOSIS — N319 Neuromuscular dysfunction of bladder, unspecified: Secondary | ICD-10-CM | POA: Diagnosis present

## 2022-07-01 DIAGNOSIS — I428 Other cardiomyopathies: Secondary | ICD-10-CM | POA: Diagnosis present

## 2022-07-01 DIAGNOSIS — I5023 Acute on chronic systolic (congestive) heart failure: Secondary | ICD-10-CM | POA: Diagnosis not present

## 2022-07-01 DIAGNOSIS — I959 Hypotension, unspecified: Secondary | ICD-10-CM | POA: Diagnosis not present

## 2022-07-01 DIAGNOSIS — J309 Allergic rhinitis, unspecified: Secondary | ICD-10-CM | POA: Diagnosis present

## 2022-07-01 DIAGNOSIS — I110 Hypertensive heart disease with heart failure: Secondary | ICD-10-CM | POA: Diagnosis not present

## 2022-07-01 DIAGNOSIS — G35 Multiple sclerosis: Secondary | ICD-10-CM | POA: Diagnosis present

## 2022-07-01 DIAGNOSIS — R7989 Other specified abnormal findings of blood chemistry: Secondary | ICD-10-CM

## 2022-07-01 DIAGNOSIS — Z820 Family history of epilepsy and other diseases of the nervous system: Secondary | ICD-10-CM

## 2022-07-01 DIAGNOSIS — Z87891 Personal history of nicotine dependence: Secondary | ICD-10-CM

## 2022-07-01 DIAGNOSIS — I5A Non-ischemic myocardial injury (non-traumatic): Secondary | ICD-10-CM | POA: Diagnosis present

## 2022-07-01 DIAGNOSIS — E119 Type 2 diabetes mellitus without complications: Secondary | ICD-10-CM

## 2022-07-01 DIAGNOSIS — I2729 Other secondary pulmonary hypertension: Secondary | ICD-10-CM | POA: Diagnosis present

## 2022-07-01 DIAGNOSIS — Z8249 Family history of ischemic heart disease and other diseases of the circulatory system: Secondary | ICD-10-CM

## 2022-07-01 DIAGNOSIS — J9601 Acute respiratory failure with hypoxia: Secondary | ICD-10-CM | POA: Diagnosis not present

## 2022-07-01 DIAGNOSIS — Z794 Long term (current) use of insulin: Secondary | ICD-10-CM

## 2022-07-01 DIAGNOSIS — Z1624 Resistance to multiple antibiotics: Secondary | ICD-10-CM | POA: Diagnosis present

## 2022-07-01 DIAGNOSIS — H409 Unspecified glaucoma: Secondary | ICD-10-CM | POA: Diagnosis present

## 2022-07-01 DIAGNOSIS — Z993 Dependence on wheelchair: Secondary | ICD-10-CM

## 2022-07-01 DIAGNOSIS — Z8616 Personal history of COVID-19: Secondary | ICD-10-CM | POA: Diagnosis not present

## 2022-07-01 DIAGNOSIS — E669 Obesity, unspecified: Secondary | ICD-10-CM | POA: Diagnosis present

## 2022-07-01 DIAGNOSIS — Z87440 Personal history of urinary (tract) infections: Secondary | ICD-10-CM

## 2022-07-01 DIAGNOSIS — I447 Left bundle-branch block, unspecified: Secondary | ICD-10-CM | POA: Diagnosis present

## 2022-07-01 DIAGNOSIS — Z882 Allergy status to sulfonamides status: Secondary | ICD-10-CM

## 2022-07-01 HISTORY — DX: Unspecified adrenocortical insufficiency: E27.40

## 2022-07-01 HISTORY — DX: Supraventricular tachycardia, unspecified: I47.10

## 2022-07-01 HISTORY — DX: Type 2 diabetes mellitus without complications: E11.9

## 2022-07-01 HISTORY — DX: Atherosclerosis of aorta: I70.0

## 2022-07-01 HISTORY — DX: Anemia in other chronic diseases classified elsewhere: D63.8

## 2022-07-01 HISTORY — DX: Other cardiomyopathies: I42.8

## 2022-07-01 HISTORY — DX: Unspecified glaucoma: H40.9

## 2022-07-01 HISTORY — DX: Unspecified systolic (congestive) heart failure: I50.20

## 2022-07-01 LAB — COMPREHENSIVE METABOLIC PANEL
ALT: 17 U/L (ref 0–44)
AST: 17 U/L (ref 15–41)
Albumin: 2.8 g/dL — ABNORMAL LOW (ref 3.5–5.0)
Alkaline Phosphatase: 126 U/L (ref 38–126)
Anion gap: 5 (ref 5–15)
BUN: 10 mg/dL (ref 6–20)
CO2: 32 mmol/L (ref 22–32)
Calcium: 8.4 mg/dL — ABNORMAL LOW (ref 8.9–10.3)
Chloride: 108 mmol/L (ref 98–111)
Creatinine, Ser: 0.87 mg/dL (ref 0.44–1.00)
GFR, Estimated: >60 mL/min (ref >60)
Glucose, Bld: 110 mg/dL — ABNORMAL HIGH (ref 70–99)
Potassium: 4.1 mmol/L (ref 3.5–5.1)
Sodium: 145 mmol/L (ref 135–145)
Total Bilirubin: 0.5 mg/dL (ref 0.3–1.2)
Total Protein: 5.7 g/dL — ABNORMAL LOW (ref 6.5–8.1)

## 2022-07-01 LAB — CBC WITH DIFFERENTIAL/PLATELET
Abs Immature Granulocytes: 0.02 10*3/uL (ref 0.00–0.07)
Basophils Absolute: 0.1 10*3/uL (ref 0.0–0.1)
Basophils Relative: 1 %
Eosinophils Absolute: 0.6 10*3/uL — ABNORMAL HIGH (ref 0.0–0.5)
Eosinophils Relative: 8 %
HCT: 33 % — ABNORMAL LOW (ref 36.0–46.0)
Hemoglobin: 10.8 g/dL — ABNORMAL LOW (ref 12.0–15.0)
Immature Granulocytes: 0 %
Lymphocytes Relative: 24 %
Lymphs Abs: 1.6 10*3/uL (ref 0.7–4.0)
MCH: 28.7 pg (ref 26.0–34.0)
MCHC: 32.7 g/dL (ref 30.0–36.0)
MCV: 87.8 fL (ref 80.0–100.0)
Monocytes Absolute: 0.5 10*3/uL (ref 0.1–1.0)
Monocytes Relative: 8 %
Neutro Abs: 3.9 10*3/uL (ref 1.7–7.7)
Neutrophils Relative %: 59 %
Platelets: 288 10*3/uL (ref 150–400)
RBC: 3.76 MIL/uL — ABNORMAL LOW (ref 3.87–5.11)
RDW: 16.3 % — ABNORMAL HIGH (ref 11.5–15.5)
WBC: 6.7 10*3/uL (ref 4.0–10.5)
nRBC: 0 % (ref 0.0–0.2)

## 2022-07-01 LAB — TROPONIN I (HIGH SENSITIVITY)
Troponin I (High Sensitivity): 18 ng/L — ABNORMAL HIGH (ref <18)
Troponin I (High Sensitivity): 19 ng/L — ABNORMAL HIGH (ref <18)

## 2022-07-01 LAB — I-STAT VENOUS BLOOD GAS, ED
Acid-Base Excess: 5 mmol/L — ABNORMAL HIGH (ref 0.0–2.0)
Bicarbonate: 32.7 mmol/L — ABNORMAL HIGH (ref 20.0–28.0)
Calcium, Ion: 1.16 mmol/L (ref 1.15–1.40)
HCT: 33 % — ABNORMAL LOW (ref 36.0–46.0)
Hemoglobin: 11.2 g/dL — ABNORMAL LOW (ref 12.0–15.0)
O2 Saturation: 99 %
Potassium: 4 mmol/L (ref 3.5–5.1)
Sodium: 147 mmol/L — ABNORMAL HIGH (ref 135–145)
TCO2: 35 mmol/L — ABNORMAL HIGH (ref 22–32)
pCO2, Ven: 61.6 mmHg — ABNORMAL HIGH (ref 44–60)
pH, Ven: 7.333 (ref 7.25–7.43)
pO2, Ven: 164 mmHg — ABNORMAL HIGH (ref 32–45)

## 2022-07-01 LAB — PROCALCITONIN: Procalcitonin: <0.1 ng/mL

## 2022-07-01 LAB — MAGNESIUM: Magnesium: 1.8 mg/dL (ref 1.7–2.4)

## 2022-07-01 LAB — CBG MONITORING, ED: Glucose-Capillary: 121 mg/dL — ABNORMAL HIGH (ref 70–99)

## 2022-07-01 LAB — BRAIN NATRIURETIC PEPTIDE: B Natriuretic Peptide: 3102.7 pg/mL — ABNORMAL HIGH (ref 0.0–100.0)

## 2022-07-01 MED ORDER — LINACLOTIDE 145 MCG PO CAPS
290.0000 ug | ORAL_CAPSULE | Freq: Every day | ORAL | Status: DC
  Administered 2022-07-02 – 2022-07-06 (×5): 290 ug via ORAL
  Filled 2022-07-01 (×5): qty 2

## 2022-07-01 MED ORDER — GABAPENTIN 400 MG PO CAPS
800.0000 mg | ORAL_CAPSULE | Freq: Three times a day (TID) | ORAL | Status: DC
  Administered 2022-07-02 – 2022-07-04 (×7): 800 mg via ORAL
  Filled 2022-07-01 (×7): qty 2

## 2022-07-01 MED ORDER — METOPROLOL SUCCINATE ER 50 MG PO TB24
75.0000 mg | ORAL_TABLET | Freq: Every day | ORAL | Status: DC
  Administered 2022-07-02 – 2022-07-06 (×5): 75 mg via ORAL
  Filled 2022-07-01 (×2): qty 1
  Filled 2022-07-01: qty 3
  Filled 2022-07-01 (×2): qty 1

## 2022-07-01 MED ORDER — MELATONIN 3 MG PO TABS
3.0000 mg | ORAL_TABLET | Freq: Every evening | ORAL | Status: DC | PRN
  Administered 2022-07-02 – 2022-07-03 (×2): 3 mg via ORAL
  Filled 2022-07-01 (×2): qty 1

## 2022-07-01 MED ORDER — INSULIN ASPART 100 UNIT/ML IJ SOLN
0.0000 [IU] | Freq: Three times a day (TID) | INTRAMUSCULAR | Status: DC
  Administered 2022-07-04 (×2): 1 [IU] via SUBCUTANEOUS
  Administered 2022-07-05: 2 [IU] via SUBCUTANEOUS
  Administered 2022-07-06: 1 [IU] via SUBCUTANEOUS

## 2022-07-01 MED ORDER — SENNOSIDES-DOCUSATE SODIUM 8.6-50 MG PO TABS
2.0000 | ORAL_TABLET | Freq: Two times a day (BID) | ORAL | Status: DC
  Administered 2022-07-02 – 2022-07-06 (×7): 2 via ORAL
  Filled 2022-07-01 (×8): qty 2

## 2022-07-01 MED ORDER — LATANOPROST 0.005 % OP SOLN
1.0000 [drp] | Freq: Every day | OPHTHALMIC | Status: DC
  Administered 2022-07-04 – 2022-07-05 (×2): 1 [drp] via OPHTHALMIC
  Filled 2022-07-01 (×2): qty 2.5

## 2022-07-01 MED ORDER — FUROSEMIDE 10 MG/ML IJ SOLN
40.0000 mg | Freq: Two times a day (BID) | INTRAMUSCULAR | Status: DC
  Administered 2022-07-02 – 2022-07-05 (×8): 40 mg via INTRAVENOUS
  Filled 2022-07-01 (×9): qty 4

## 2022-07-01 MED ORDER — POTASSIUM CHLORIDE CRYS ER 20 MEQ PO TBCR
20.0000 meq | EXTENDED_RELEASE_TABLET | Freq: Every day | ORAL | Status: DC
  Administered 2022-07-02 – 2022-07-06 (×5): 20 meq via ORAL
  Filled 2022-07-01 (×5): qty 1

## 2022-07-01 MED ORDER — SIMVASTATIN 20 MG PO TABS
10.0000 mg | ORAL_TABLET | Freq: Every day | ORAL | Status: DC
  Administered 2022-07-02 – 2022-07-05 (×5): 10 mg via ORAL
  Filled 2022-07-01 (×5): qty 1

## 2022-07-01 MED ORDER — ACETAMINOPHEN 325 MG PO TABS
650.0000 mg | ORAL_TABLET | Freq: Four times a day (QID) | ORAL | Status: DC | PRN
  Administered 2022-07-02: 650 mg via ORAL
  Filled 2022-07-01: qty 2

## 2022-07-01 MED ORDER — DOCUSATE SODIUM 100 MG PO CAPS
100.0000 mg | ORAL_CAPSULE | Freq: Two times a day (BID) | ORAL | Status: DC
  Administered 2022-07-02 – 2022-07-06 (×8): 100 mg via ORAL
  Filled 2022-07-01 (×9): qty 1

## 2022-07-01 MED ORDER — ASPIRIN 325 MG PO TBEC
325.0000 mg | DELAYED_RELEASE_TABLET | Freq: Every day | ORAL | Status: DC
  Administered 2022-07-02 – 2022-07-05 (×4): 325 mg via ORAL
  Filled 2022-07-01 (×4): qty 1

## 2022-07-01 MED ORDER — FERROUS SULFATE 325 (65 FE) MG PO TABS
325.0000 mg | ORAL_TABLET | Freq: Two times a day (BID) | ORAL | Status: DC
  Administered 2022-07-02 – 2022-07-06 (×9): 325 mg via ORAL
  Filled 2022-07-01 (×9): qty 1

## 2022-07-01 MED ORDER — BACLOFEN 10 MG PO TABS
10.0000 mg | ORAL_TABLET | Freq: Three times a day (TID) | ORAL | Status: DC
  Administered 2022-07-02 – 2022-07-06 (×13): 10 mg via ORAL
  Filled 2022-07-01 (×14): qty 1

## 2022-07-01 MED ORDER — FUROSEMIDE 10 MG/ML IJ SOLN
40.0000 mg | Freq: Once | INTRAMUSCULAR | Status: AC
  Administered 2022-07-01: 40 mg via INTRAVENOUS
  Filled 2022-07-01: qty 4

## 2022-07-01 MED ORDER — ACETAMINOPHEN 650 MG RE SUPP: 650.0000 mg | Freq: Four times a day (QID) | RECTAL | Status: DC | PRN

## 2022-07-01 MED ORDER — ALBUTEROL SULFATE (2.5 MG/3ML) 0.083% IN NEBU
2.5000 mg | INHALATION_SOLUTION | Freq: Four times a day (QID) | RESPIRATORY_TRACT | Status: DC | PRN
  Administered 2022-07-02: 2.5 mg via RESPIRATORY_TRACT
  Filled 2022-07-01: qty 3

## 2022-07-01 MED ORDER — ACETAMINOPHEN 500 MG PO TABS
1000.0000 mg | ORAL_TABLET | Freq: Once | ORAL | Status: AC
  Administered 2022-07-01: 1000 mg via ORAL
  Filled 2022-07-01: qty 2

## 2022-07-01 MED ORDER — POLYETHYLENE GLYCOL 3350 17 G PO PACK: 17.0000 g | PACK | Freq: Every day | ORAL | Status: DC | PRN

## 2022-07-01 MED ORDER — AMANTADINE HCL 100 MG PO CAPS
100.0000 mg | ORAL_CAPSULE | Freq: Every day | ORAL | Status: DC
  Administered 2022-07-02 – 2022-07-06 (×5): 100 mg via ORAL
  Filled 2022-07-01 (×5): qty 1

## 2022-07-01 MED ORDER — TRAMADOL HCL 50 MG PO TABS
100.0000 mg | ORAL_TABLET | Freq: Two times a day (BID) | ORAL | Status: DC | PRN
  Administered 2022-07-02 – 2022-07-05 (×4): 100 mg via ORAL
  Filled 2022-07-01 (×4): qty 2

## 2022-07-01 MED ORDER — DULOXETINE HCL 60 MG PO CPEP
60.0000 mg | ORAL_CAPSULE | Freq: Every day | ORAL | Status: DC
  Administered 2022-07-02 – 2022-07-06 (×5): 60 mg via ORAL
  Filled 2022-07-01 (×4): qty 1
  Filled 2022-07-01: qty 2

## 2022-07-01 MED ORDER — BUPROPION HCL ER (XL) 150 MG PO TB24
150.0000 mg | ORAL_TABLET | Freq: Every day | ORAL | Status: DC
  Administered 2022-07-02 – 2022-07-06 (×5): 150 mg via ORAL
  Filled 2022-07-01 (×6): qty 1

## 2022-07-01 MED ORDER — FLUTICASONE PROPIONATE 50 MCG/ACT NA SUSP
1.0000 | Freq: Every day | NASAL | Status: DC
  Filled 2022-07-01 (×2): qty 16

## 2022-07-01 NOTE — ED Provider Notes (Signed)
[facility] Provider Note   CSN: [id number] Arrival date & time: 07/01/22  1550     History Chief Complaint  Patient presents with   Shortness of Breath   Fatigue    HPI Shannon Garrett is a 61 y.o. female presenting for cough, shortness of breath, fatigue, malaise, dyspnea.  She has an extensive medical history including MS bedbound at baseline lives at a skilled nursing facility.  Also has heart failure.  She has been taking her Lasix but endorses decreased urine output.  No known sick contacts otherwise without fevers.  Endorses some intermittent cough with frothy secretions.   Patient's recorded medical, surgical, social, medication list and allergies were reviewed in the Snapshot window as part of the initial history.   Review of Systems   Review of Systems  Constitutional:  Negative for chills and fever.  HENT:  Negative for ear pain and sore throat.   Eyes:  Negative for pain and visual disturbance.  Respiratory:  Positive for cough and shortness of breath.   Cardiovascular:  Negative for chest pain and palpitations.  Gastrointestinal:  Negative for abdominal pain and vomiting.  Genitourinary:  Negative for dysuria and hematuria.  Musculoskeletal:  Negative for arthralgias and back pain.  Skin:  Negative for color change and rash.  Neurological:  Negative for seizures and syncope.  All other systems reviewed and are negative.   Physical Exam Updated Vital Signs BP 122/64   Pulse 79   Temp 97.7 F (36.5 C) (Oral)   Resp (!) 25   Ht '5\' 5"'$  (1.651 m)   Wt 99.8 kg   SpO2 100%   BMI 36.61 kg/m  Physical Exam Vitals and nursing note reviewed.  Constitutional:      General: She is not in acute distress.    Appearance: She is well-developed.  HENT:     Head: Normocephalic and atraumatic.  Eyes:     Conjunctiva/sclera: Conjunctivae normal.  Cardiovascular:     Rate and Rhythm: Normal rate and regular rhythm.     Heart  sounds: No murmur heard. Pulmonary:     Effort: Pulmonary effort is normal. No respiratory distress.     Breath sounds: Rhonchi and rales present.  Abdominal:     General: There is no distension.     Palpations: Abdomen is soft.     Tenderness: There is no abdominal tenderness. There is no right CVA tenderness or left CVA tenderness.  Musculoskeletal:        General: No swelling or tenderness. Normal range of motion.     Cervical back: Neck supple.  Skin:    General: Skin is warm and dry.  Neurological:     General: No focal deficit present.     Mental Status: She is alert and oriented to person, place, and time. Mental status is at baseline.     Cranial Nerves: No cranial nerve deficit.      ED Course/ Medical Decision Making/ A&P    Procedures Procedures   Medications Ordered in ED Medications  acetaminophen (TYLENOL) tablet 650 mg (has no administration in time range)    Or  acetaminophen (TYLENOL) suppository 650 mg (has no administration in time range)  melatonin tablet 3 mg (has no administration in time range)  furosemide (LASIX) injection 40 mg (has no administration in time range)  metoprolol succinate (TOPROL-XL) 24 hr tablet 75 mg (has no administration in time range)  insulin aspart (novoLOG) injection 0-9 Units (has no administration  in time range)  albuterol (PROVENTIL) (2.5 MG/3ML) 0.083% nebulizer solution 2.5 mg (has no administration in time range)  amantadine (SYMMETREL) capsule 100 mg (has no administration in time range)  aspirin EC tablet 325 mg (has no administration in time range)  baclofen (LIORESAL) tablet 10 mg (has no administration in time range)  buPROPion (WELLBUTRIN XL) 24 hr tablet 150 mg (has no administration in time range)  docusate sodium (COLACE) capsule 100 mg (has no administration in time range)  DULoxetine (CYMBALTA) DR capsule 60 mg (has no administration in time range)  ferrous sulfate tablet 325 mg (has no administration in time  range)  fluticasone (FLONASE) 50 MCG/ACT nasal spray 1 spray (has no administration in time range)  gabapentin (NEURONTIN) tablet 800 mg (has no administration in time range)  latanoprost (XALATAN) 0.005 % ophthalmic solution 1 drop (has no administration in time range)  linaclotide (LINZESS) capsule 290 mcg (has no administration in time range)  polyethylene glycol (MIRALAX / GLYCOLAX) packet 17 g (has no administration in time range)  Potassium Chloride ER TBCR 20 mEq (has no administration in time range)  senna-docusate (Senokot-S) tablet 2 tablet (has no administration in time range)  simvastatin (ZOCOR) tablet 10 mg (has no administration in time range)  traMADol (ULTRAM) tablet 100 mg (has no administration in time range)  furosemide (LASIX) injection 40 mg (40 mg Intravenous Given 07/01/22 2050)  acetaminophen (TYLENOL) tablet 1,000 mg (1,000 mg Oral Given 07/01/22 2114)    Medical Decision Making:    Shannon Garrett is a 61 y.o. female who presented to the ED today with cough and SOB detailed above.     Patient's presentation is complicated by their history of multiple comorbid medical problems.  Patient placed on continuous vitals and telemetry monitoring while in ED which was reviewed periodically.   Complete initial physical exam performed, notably the patient  was hemodynamically stable in no acute distress.  She is tachypneic on 2 L nasal cannula which is up from her baseline of room air.      Reviewed and confirmed nursing documentation for past medical history, family history, social history.    Initial Assessment:   With the patient's presentation of edema, shortness of breath, most likely diagnosis is heart failure exacerbation. Other diagnoses were considered including (but not limited to) MS exacerbation, COPD, pneumonia, pneumothorax. These are considered less likely due to history of present illness and physical exam findings.   This is most consistent with an acute  life/limb threatening illness complicated by underlying chronic conditions.  Initial Plan:  Blood gas to evaluate for hypercapnia Screening labs including CBC and Metabolic panel to evaluate for infectious or metabolic etiology of disease.  Urinalysis with reflex culture ordered to evaluate for UTI or relevant urologic/nephrologic pathology.  CXR to evaluate for structural/infectious intrathoracic pathology.  Troponin/BNP/EKG to evaluate for cardiac pathology. Objective evaluation as below reviewed with plan for close reassessment  Initial Study Results:   Laboratory  All laboratory results reviewed without evidence of clinically relevant pathology.   Exceptions include: BNP elevation to 3000  EKG EKG was reviewed independently. Rate, rhythm, axis, intervals all examined and without medically relevant abnormality. ST segments without concerns for elevations.    Radiology  All images reviewed independently. Agree with radiology report at this time.   DG Chest Portable 1 View  Result Date: 07/01/2022 CLINICAL DATA:  Shortness of breath EXAM: PORTABLE CHEST 1 VIEW COMPARISON:  05/01/2022 FINDINGS: Cardiomegaly with vascular congestion, mild edema and probable pleural  effusions. Basilar consolidations. No pneumothorax IMPRESSION: Cardiomegaly with vascular congestion, mild edema and probable pleural effusions. Basilar consolidations may be due to atelectasis or pneumonia. Electronically Signed   By: Donavan Foil M.D.   On: 07/01/2022 16:59     Consults:  Case discussed with on-call cardiology fellow.  Stated that cardiology can follow this consultation but given the overlap clinically with other conditions that they would recommend medical admission.  Discussed with [facility] hospitalist agreed with need for admission.   Final Assessment and Plan:   Patient's history of present illness and physical exam findings are most consistent with heart failure exacerbation.  Patient diuresed in  emergency department with 40 mg IV diuresis Lasix and reassess with some appropriate response. Disposition:   Based on the above findings, I believe this patient is stable for admission.    Patient/family educated about specific findings on our evaluation and explained exact reasons for admission.  Patient/family educated about clinical situation and time was allowed to answer questions.   Admission team communicated with and agreed with need for admission. Patient admitted. Patient ready to move at this time.     Emergency Department Medication Summary:   Medications  acetaminophen (TYLENOL) tablet 650 mg (has no administration in time range)    Or  acetaminophen (TYLENOL) suppository 650 mg (has no administration in time range)  melatonin tablet 3 mg (has no administration in time range)  furosemide (LASIX) injection 40 mg (has no administration in time range)  metoprolol succinate (TOPROL-XL) 24 hr tablet 75 mg (has no administration in time range)  insulin aspart (novoLOG) injection 0-9 Units (has no administration in time range)  albuterol (PROVENTIL) (2.5 MG/3ML) 0.083% nebulizer solution 2.5 mg (has no administration in time range)  amantadine (SYMMETREL) capsule 100 mg (has no administration in time range)  aspirin EC tablet 325 mg (has no administration in time range)  baclofen (LIORESAL) tablet 10 mg (has no administration in time range)  buPROPion (WELLBUTRIN XL) 24 hr tablet 150 mg (has no administration in time range)  docusate sodium (COLACE) capsule 100 mg (has no administration in time range)  DULoxetine (CYMBALTA) DR capsule 60 mg (has no administration in time range)  ferrous sulfate tablet 325 mg (has no administration in time range)  fluticasone (FLONASE) 50 MCG/ACT nasal spray 1 spray (has no administration in time range)  gabapentin (NEURONTIN) tablet 800 mg (has no administration in time range)  latanoprost (XALATAN) 0.005 % ophthalmic solution 1 drop (has no  administration in time range)  linaclotide (LINZESS) capsule 290 mcg (has no administration in time range)  polyethylene glycol (MIRALAX / GLYCOLAX) packet 17 g (has no administration in time range)  Potassium Chloride ER TBCR 20 mEq (has no administration in time range)  senna-docusate (Senokot-S) tablet 2 tablet (has no administration in time range)  simvastatin (ZOCOR) tablet 10 mg (has no administration in time range)  traMADol (ULTRAM) tablet 100 mg (has no administration in time range)  furosemide (LASIX) injection 40 mg (40 mg Intravenous Given 07/01/22 2050)  acetaminophen (TYLENOL) tablet 1,000 mg (1,000 mg Oral Given 07/01/22 2114)         Clinical Impression:  1. Shortness of breath      Admit   Final Clinical Impression(s) / ED Diagnoses Final diagnoses:  Shortness of breath    Rx / DC Orders ED Discharge Orders     None         Tretha Sciara, MD 07/01/22 2239

## 2022-07-01 NOTE — ED Triage Notes (Signed)
PT BIB EMS from Nursing facility for SOB and Lethargy x 1 day. Per EMS pt is on 2.5l o2 at all times and unable to keep sats up. Pt placed on NRB at 15l via by EMS, crackles heard in bases of lunch and diaphoresis. Pt A&O x 4 during triage.

## 2022-07-01 NOTE — ED Notes (Signed)
IV team at bedside 

## 2022-07-01 NOTE — H&P (Signed)
History and Physical      Shannon Garrett PIR:[id number] DOB: [birth year] DOA: 07/01/2022  PCP: Pcp, No (will further assess) Patient coming from: SNF   I have personally briefly reviewed patient's old medical records in [place]  Chief Complaint: sob  HPI: Shannon Garrett is a 61 y.o. female with medical history significant for chronic systolic heart failure, MS, essential hypertension, anemia of chronic disease with baseline hemoglobin 11-12, type 2 diabetes mellitus, who is admitted to [facility] [place] on 07/01/2022 with acute on chronic systolic heart failure after presenting from snf to [facility] ED complaining of shortness of breath.   The patient reports 3 to 4 days of progressive shortness of breath associated with worsening edema in the bilateral lower extremities as well as orthopnea.  She confirms no known baseline supplemental oxygen requirements.  No associated any recent chest pain, palpitations, diaphoresis, dizziness, presyncope, or syncope.  She also denies any recent subjective fever, chills, rigors, or generalized myalgias.  Notes recent mild, nonproductive cough.  Denies any hemoptysis, new lower extremity erythema, or new lower extremity calf tenderness.  Reports good compliance with her outpatient diuretic regimen which includes Lasix 20 mg p.o. daily as well as daily spironolactone, in the context of history of chronic systolic heart failure.  She notes brief prior smoking history, having smoked approximately half pack per day for 5 years before completely quitting smoking greater than 10 years ago.  No known history of underlying obstructive lung disease.  Most recent echocardiogram was performed in November 2021 and was notable for LVEF 35 to 40%, indeterminate diastolic parameters, normal right ventricular systolic function, trivial mitral regurgitation and mild aortic regurgitation.  She also has a history of multiple sclerosis, acknowledges that she is now largely  bedbound as a consequence of this, residing at [facility].      ED Course:  Vital signs in the ED were notable for the following: Afebrile; heart rate 63-01; systolic blood pressures in the 120s to 130s; respiratory rate 17-28; initial oxygen saturation 88% on room air, socially improving into the mid to high 90s on 2 L nasal cannula.  Labs were notable for the following: CMP notable for potassium 4.1, creatinine 0.87, calcium, just for mild hypoalbuminemia noted to be 9.4, albumin 2.8, otherwise liver enzymes within normal limits.  BNP 3100 compared to most recent prior value of 1300 on 05/01/2022; initial high-sensitivity troponin I 18, with repeat trending up slightly to 19.  CBC notable for blood cell count 6010 with neutrophilic predominance, hemoglobin 10.8 compared to 11.2 on 05/01/2022, with presenting hemoglobin associated normocytic/normochromic properties.  VBG result currently pending.  Per my interpretation, EKG in ED demonstrated the following: In comparison to most recent prior EKG from 05/04/2022, presenting EKG shows sinus rhythm with known left bundle branch block, heart rate 77, new T wave inversion in lateral leads, and no evidence of ST changes, including no evidence of ST elevation.  Imaging and additional notable ED work-up: 1 view chest x-ray, per formal radiology read, in comparison to most recent prior chest x-ray from 05/01/2022, shows interval increase in pulmonary vascular congestion, interval development of mild pulmonary edema, as well as interval development of suspected small bilateral pleural effusions as well as bibasilar airspace opacities, favoring atelectasis versus infiltrate, in the absence of any evidence of pneumothorax.  I d/w EDP, Dr. Oswald Hillock, who discussed patient's case/imaging with the on-call cardiology fellow, Dr. Chalmers Cater, Who, in the context of the patient's multiple comorbidities, recommended admission to the hospitalist service  for further evaluation  management of acute on chronic systolic heart failure, including additional IV diuresis, cardiology will formally consult, with additional recommendations pending at this time.  While in the ED, the following were administered: 40 mg IV x 1.  Acetaminophen 1 g p.o. x 1.  Subsequently, the patient was admitted for further evaluation management acute on chronic systolic heart failure associated with acute hypoxic respiratory distress.     Review of Systems: As per HPI otherwise 10 point review of systems negative.   Past Medical History:  Diagnosis Date   Cardiomyopathy [facility])    a.  Echo 04/29/12: Mild LVH, EF 20-25%, mild AI, moderate MR, moderate LAE, mild RAE, mild RVE, moderate TR, PASP 51, small pericardial effusion;   b. probably non-ischemic given multiple chemo-Tx agents used for MS and global LV dysfn on echo   Chronic systolic heart failure ([place])    COVID-19 05/10/2019   Per facility paperwork   Decubitus ulcer    stage II sacral decubitus ulcer.    Depression    Glaucoma    Hypertension    MS (multiple sclerosis) ([place])    a. Dx'd late 20's. b. Tx with Novantrone, Tysabri, Copaxone previously.    Past Surgical History:  Procedure Laterality Date   ABLATION     uterine   BILIARY DILATION  02/15/2020   Procedure: BILIARY DILATION;  Surgeon: Rush Landmark Telford Nab., MD;  Location: [facility];  Service: Gastroenterology;;   CESAREAN SECTION     CHOLECYSTECTOMY N/A 07/20/2019   Procedure: DIAGNOSTIC LAPAROSCOPY;  Surgeon: [name], MD;  Location: [facility];  Service: General;  Laterality: N/A;   CHOLECYSTECTOMY N/A 02/12/2020   Procedure: LAPAROSCOPIC CHOLECYSTECTOMY;  Surgeon: Clovis Riley, MD;  Location: [facility];  Service: General;  Laterality: N/A;   ERCP N/A 02/15/2020   Procedure: ENDOSCOPIC RETROGRADE CHOLANGIOPANCREATOGRAPHY (ERCP);  Surgeon: Irving Copas., MD;  Location: [facility];  Service: Gastroenterology;  Laterality: N/A;   IR EXCHANGE BILIARY  DRAIN  09/07/2019   IR PERC CHOLECYSTOSTOMY  07/21/2019   LEFT HEART CATH AND CORONARY ANGIOGRAPHY N/A 10/29/2019   Procedure: LEFT HEART CATH AND CORONARY ANGIOGRAPHY;  Surgeon: Leonie Man, MD;  Location: [facility];  Service: Cardiovascular;  Laterality: N/A;   REMOVAL OF STONES  02/15/2020   Procedure: REMOVAL OF STONES;  Surgeon: Rush Landmark Telford Nab., MD;  Location: [facility];  Service: Gastroenterology;;   Joan Mayans  02/15/2020   Procedure: Joan Mayans;  Surgeon: Mansouraty, Telford Nab., MD;  Location: [facility];  Service: Gastroenterology;;    Social History:  reports that she has quit smoking. She has a 2.50 pack-year smoking history. She has never used smokeless tobacco. She reports that she does not drink alcohol and does not use drugs.   Allergies  Allergen Reactions   Sulfonamide Derivatives Hives and Shortness Of Breath   Penicillins Hives and Itching    Prior course of rocephin charted 05/2015 Also note has tolerated Zosyn in past 2016 and 2017    Family History  Problem Relation Age of Onset   Hypertension Mother    Cancer Mother        breast    Cancer Father        prostate   Multiple sclerosis Sister    Heart attack Neg Hx     Family history reviewed and not pertinent    Prior to Admission medications   Medication Sig Start Date End Date Taking? Authorizing Provider  acetaminophen (TYLENOL) 325 MG tablet  Take 650 mg by mouth every 8 (eight) hours as needed (pain, fever > 100.5).    [provider]  albuterol (PROVENTIL) (2.5 MG/3ML) 0.083% nebulizer solution Take 2.5 mg by nebulization every 6 (six) hours as needed for wheezing or shortness of breath.    [provider]  albuterol (VENTOLIN HFA) 108 (90 Base) MCG/ACT inhaler Inhale 2 puffs into the lungs every 6 (six) hours as needed for wheezing or shortness of breath.  07/10/19   [provider]  amantadine (SYMMETREL) 100 MG capsule Take 100 mg by mouth in  the morning.    [provider]  ascorbic acid (VITAMIN C) 500 MG tablet Take 500 mg by mouth in the morning.    [provider]  aspirin 325 MG EC tablet Take 325 mg by mouth in the morning.    [provider]  azelastine (ASTELIN) 0.1 % nasal spray Place 2 sprays into both nostrils 2 (two) times daily.    [provider]  AZO-CRANBERRY PO Take 2 each by mouth in the morning.    [provider]  baclofen (LIORESAL) 10 MG tablet Take 10 mg by mouth 3 (three) times daily. Hold if lethargic or confused    [provider]  bisacodyl (DULCOLAX) 10 MG suppository Place 10 mg rectally daily as needed for moderate constipation. If no results for MOM    [provider]  buPROPion (WELLBUTRIN XL) 150 MG 24 hr tablet Take 150 mg by mouth in the morning.    [provider]  cefTRIAXone (ROCEPHIN) 1 g injection Inject 1 g into the muscle once. Patient not taking: No sig reported    [provider]  cephALEXin (KEFLEX) 250 MG capsule Take 250 mg by mouth 3 (three) times daily. For 5 days Patient not taking: No sig reported    [provider]  docusate sodium (COLACE) 100 MG capsule Take 100 mg by mouth 2 (two) times daily.    [provider]  doxycycline (VIBRAMYCIN) 100 MG capsule Take 1 capsule (100 mg total) by mouth 2 (two) times daily. 05/01/22   Hayden Rasmussen, MD  DULoxetine (CYMBALTA) 20 MG capsule Take 60 mg by mouth in the morning.    [provider]  Ensure (ENSURE) Take 237 mLs by mouth 3 (three) times daily.    [provider]  ferrous sulfate 325 (65 FE) MG tablet Take 325 mg by mouth 2 (two) times daily with a meal.  02/20/18   [provider]  fluticasone (FLONASE) 50 MCG/ACT nasal spray Place 1 spray into both nostrils at bedtime.    [provider]  furosemide (LASIX) 20 MG tablet Take 20 mg by mouth in the morning.    [provider]  gabapentin  (NEURONTIN) 800 MG tablet Take 800 mg by mouth 3 (three) times daily.    [provider]  insulin aspart (NOVOLOG FLEXPEN) 100 UNIT/ML FlexPen 0-15 Units, Subcutaneous, 3 times daily with meals CBG < 70: implement hypoglycemia protocol-call MD CBG 70 - 120: 0 units CBG 121 - 150: 2 units CBG 151 - 200: 3 units CBG 201 - 250: 5 units CBG 251 - 300: 8 units CBG 301 - 350: 11 units CBG 351 - 400: 15 units CBG > 400: Patient taking differently: Inject 0-15 Units into the skin 3 (three) times daily with meals. 0-15 Units, Subcutaneous, 3 times daily with meals per sliding scale : CBG < 70: implement hypoglycemia protocol-call MD CBG 0 -  120 : 0 units CBG 121 - 150: 2 units CBG 151 - 200: 3 units CBG 201 - 250: 5 units CBG 251 - 300: 8 units CBG 301 - 350: 11 units CBG 351 - 400: 15 units CBG >400, CALL MD 05/18/19   Jonetta Osgood, MD  LANTUS SOLOSTAR 100 UNIT/ML Solostar Pen Inject 5 Units into the skin daily. 04/23/20   [provider]  latanoprost (XALATAN) 0.005 % ophthalmic solution Place 1 drop into both eyes at bedtime.    [provider]  linaclotide (LINZESS) 290 MCG CAPS capsule Take 290 mcg by mouth daily before breakfast.    [provider]  Melatonin 5 MG TABS Take 10 mg by mouth at bedtime.  11/14/17   [provider]  metoprolol succinate (TOPROL-XL) 100 MG 24 hr tablet Take 150 mg by mouth in the morning.    [provider]  Multiple Vitamins-Minerals (MULTIVITAMIN ADULT PO) Take 1 tablet by mouth in the morning.    [provider]  NONFORMULARY OR COMPOUNDED ITEM Apply 1 application  topically 3 (three) times daily. A&D/Balmex/Lidocaine 1:1:1. Apply to vulva 3 times daily.    [provider]  nystatin (MYCOSTATIN/NYSTOP) powder Apply 1 Application topically 2 (two) times daily. To vagina, armpits    [provider]  ondansetron (ZOFRAN) 4 MG tablet Take 4 mg by mouth every 6 (six) hours as  needed for nausea or vomiting. 11/01/17   [provider]  oxyCODONE-acetaminophen (PERCOCET/ROXICET) 5-325 MG tablet Take 1 tablet by mouth every 6 (six) hours as needed for severe pain. 05/14/21   Blanchie Dessert, MD  polyethylene glycol (MIRALAX MIX-IN PAX) 17 g packet Take 17 g by mouth daily. Patient taking differently: Take 17 g by mouth in the morning. 05/21/20   Donne Hazel, MD  Potassium Chloride ER 20 MEQ TBCR Take 20 mEq by mouth in the morning. 05/17/20   [provider]  Probiotic Product (PROBIOTIC PO) Take 1 capsule by mouth 2 (two) times daily.    [provider]  senna-docusate (SENOKOT-S) 8.6-50 MG tablet Take 2 tablets by mouth 2 (two) times daily.    [provider]  simethicone (MYLICON) 299 MG chewable tablet Chew 125 mg by mouth every 6 (six) hours as needed for flatulence.    [provider]  simvastatin (ZOCOR) 10 MG tablet Take 10 mg by mouth at bedtime.    [provider]  spironolactone (ALDACTONE) 25 MG tablet Take 25 mg by mouth in the morning. 05/02/20   [provider]  traMADol (ULTRAM) 50 MG tablet Take 100 mg by mouth 2 (two) times daily as needed (pain).    [provider]     Objective    Physical Exam: Vitals:   07/01/22 2015 07/01/22 2030 07/01/22 2045 07/01/22 2100  BP: 133/72 124/78 127/81 127/70  Pulse: 80 77 79 79  Resp: (!) 24 (!) 28 (!) 23 (!) 28  Temp:      TempSrc:      SpO2: 100% 100% 100% 100%  Weight:      Height:        General: appears to be stated age; alert, oriented; mildly increased wob noted;  Skin: warm, dry, no rash Head:  AT/[place] Mouth:  Oral mucosa membranes appear moist, normal dentition Neck: supple; trachea midline Heart:  RRR; did not appreciate any M/R/G Lungs: CTAB, did not appreciate any wheezes, rales, or rhonchi Abdomen: + BS; soft, ND, NT Vascular: 2+ pedal pulses  b/l; 2+ radial pulses b/l Extremities: 1-2+ edema in b/l LE's noted; no  erythema     Labs on Admission: I have personally reviewed following labs and imaging studies  CBC: Recent Labs  Lab 07/01/22 1616 07/01/22 1926  WBC 6.7  --   NEUTROABS 3.9  --   HGB 10.8* 11.2*  HCT 33.0* 33.0*  MCV 87.8  --   PLT 288  --    Basic Metabolic Panel: Recent Labs  Lab 07/01/22 1616 07/01/22 1926  NA 145 147*  K 4.1 4.0  CL 108  --   CO2 32  --   GLUCOSE 110*  --   BUN 10  --   CREATININE 0.87  --   CALCIUM 8.4*  --    GFR: Estimated Creatinine Clearance: 81.4 mL/min (by C-G formula based on SCr of 0.87 mg/dL). Liver Function Tests: Recent Labs  Lab 07/01/22 1616  AST 17  ALT 17  ALKPHOS 126  BILITOT 0.5  PROT 5.7*  ALBUMIN 2.8*   No results for input(s): "LIPASE", "AMYLASE" in the last 168 hours. No results for input(s): "AMMONIA" in the last 168 hours. Coagulation Profile: No results for input(s): "INR", "PROTIME" in the last 168 hours. Cardiac Enzymes: No results for input(s): "CKTOTAL", "CKMB", "CKMBINDEX", "TROPONINI" in the last 168 hours. BNP (last 3 results) No results for input(s): "PROBNP" in the last 8760 hours. HbA1C: No results for input(s): "HGBA1C" in the last 72 hours. CBG: Recent Labs  Lab 07/01/22 1604  GLUCAP 121*   Lipid Profile: No results for input(s): "CHOL", "HDL", "LDLCALC", "TRIG", "CHOLHDL", "LDLDIRECT" in the last 72 hours. Thyroid Function Tests: No results for input(s): "TSH", "T4TOTAL", "FREET4", "T3FREE", "THYROIDAB" in the last 72 hours. Anemia Panel: No results for input(s): "VITAMINB12", "FOLATE", "FERRITIN", "TIBC", "IRON", "RETICCTPCT" in the last 72 hours. Urine analysis:    Component Value Date/Time   COLORURINE YELLOW (A) 01/05/2022 1249   APPEARANCEUR CLOUDY (A) 01/05/2022 1249   LABSPEC 1.011 01/05/2022 1249   PHURINE 9.0 (H) 01/05/2022 1249   GLUCOSEU NEGATIVE 01/05/2022 1249   HGBUR NEGATIVE 01/05/2022 1249   BILIRUBINUR NEGATIVE 01/05/2022 1249   KETONESUR NEGATIVE 01/05/2022 1249    PROTEINUR 30 (A) 01/05/2022 1249   UROBILINOGEN 1.0 01/10/2015 2311   NITRITE POSITIVE (A) 01/05/2022 1249   LEUKOCYTESUR LARGE (A) 01/05/2022 1249    Radiological Exams on Admission: DG Chest Portable 1 View  Result Date: 07/01/2022 CLINICAL DATA:  Shortness of breath EXAM: PORTABLE CHEST 1 VIEW COMPARISON:  05/01/2022 FINDINGS: Cardiomegaly with vascular congestion, mild edema and probable pleural effusions. Basilar consolidations. No pneumothorax IMPRESSION: Cardiomegaly with vascular congestion, mild edema and probable pleural effusions. Basilar consolidations may be due to atelectasis or pneumonia. Electronically Signed   By: Donavan Foil M.D.   On: 07/01/2022 16:59      Assessment/Plan   Principal Problem:   Acute on chronic systolic heart failure (HCC) Active Problems:   MS (multiple sclerosis) (HCC)   Depression   Essential hypertension   Acute hypoxic respiratory failure (HCC)   Allergic rhinitis   Elevated troponin   Anemia of chronic disease   DM2 (diabetes mellitus, type 2) (HCC)      #) Acute on chronic systolic heart failure: dx of acute decompensation on the basis of presenting 3-4 days of progressive shortness of breath associate with orthopnea, worsening peripheral edema, with presentation notable for increased work of breathing, acute hypoxic respiratory distress associated to supplemental oxygen requirement, interval increase in BNP, with chest x-ray  showing interval development of worsening pulmonary vascular congestion, interval development of pulmonary edema as well as suspected small bilateral pleural effusions.  Additionally, chest x-ray demonstrates bibasilar airspace opacities, favoring atelectasis.  However, We will add on procalcitonin level to further assess for any contribution from bacterial pneumonia given for lack predominance associate with presenting CBC with differential.  This is in the context of a known history of chronic systolic heart  failure, with most recent echocardiogram performed in November 2021, demonstrating LVEF 35 to 40%, with additional details as conveyed above. Etiology leading to presenting acutely decompensated heart failure not entirely clear at this time. I suspect that mildly elevated initial troponin is a consequence of underlying acutely decompensated heart failure as opposed to representing ACS causing presenting acute heart failure exacerbation, particularly in the absence of any recent CP, and with presenting EKG showing nonspecific T wave changes in lateral leads, but otherwise no overt evidence of acute ischemic changes, including no evidence of ST elevation.  Likely additional contribution towards mildly elevated troponin from diminished oxygen delivery capacity as consequence of presenting acute hypoxic respiratory distress, as further detailed below. However, will continue to evaluate with further trending of troponin and close monitoring on tele.   Patient conveys good compliance with home diuretic therapy, which consists of Lasix 20 mg p.o. daily as well as daily spironolactone, as well as good compliance with their additional home cardiac medications, which include metoprolol succinate.  In the setting of acutely decompensated heart failure, will transiently reduce dose of beta-blocker to half.  Of note, she is not currently on an ACE inhibitor or ARB.  Of note, patient received Lasix 40 mg IV x 1 while in the ED today. Presentation warrants additional IV diuresis, as further detailed below, with close monitoring of ensuing renal function, electrolytes, and volume status.  Additionally, as it has been over 2 years since most recent echocardiogram, will repeat echo in the morning.  EDP d/w on-call cards, as above, with plan for ensuing consult and additional cards recs to follow, as above.     Plan: monitor strict I's & O's and daily weights. Monitor on telemetry. Repeat CMP in the morning, including for  monitoring trend of potassium, bicarbonate, and renal function in response to interval diuresis efforts.  Continue outpatient potassium chloride 20 mill equivalents p.o. daily.  Add-on serum magnesium level, and repeat this level in the AM. Close monitoring of ensuing blood pressure response to diuresis efforts, including to help guide need for improvement in afterload reduction in order to optimize cardiac output.  Reduce of metoprolol succinate to 75 mg p.o. daily, as above.  Trend troponin. Lasix 40 mg IV twice daily.  Cardiology to consult, as above.  Echo in morning.  Add on procalcitonin level.          #) Acute hypoxic respiratory distress: in the context of acute respiratory symptoms and no known baseline supplemental O2 requirements, presenting O2 sat in the high 80s on room air, subs improving into the mid to high 90s on 2 L nasal cannula, thereby meeting criteria for acute hypoxic respiratory distress as opposed to acute hypoxic respiratory failure at this time. Appears to be on basis of acute on chronic systolic heart failure, evaluation for underlying bacterial pneumonia given presence of bibasilar airspace opacities, as above, although these findings appear to favor atelectasis.  No known chronic underlying pulmonary conditions.    Suspect that mildly elevated troponin is on the basis of supply demand mismatch resulting from  the decrease in oxygen delivery capacity that is a/w presenting acute hypoxic respiratory distress as well as stemming from consequence of acute on chronic systolic heart failure as opposed to representing a type I process due to acute plaque rupture, as further detailed above.   Overall, ACS is felt to be less likely relative to type 2 supply demand mismatch, as above. Clinically, presentation is less suggestive of acute PE at this time.    Plan: further evaluation/management of presenting acute on chronic systolic heart failure, as above.  Add on procalcitonin  level.  Monitor on telemetry. CMP/CBC in the AM. Check serum Mg and Phos levels.  Follow-up for result blood gas. incentive spirometry 210 2 suspected contribution from bibasilar atelectasis.  Trend troponin.  Echo in the morning.              #) Essential Hypertension: documented h/o such, with outpatient antihypertensive regimen including Lasix, spironolactone, metoprolol succinate.  SBP's in the ED today: In the 120s to 130s mmHg.  Will closely monitor ensuing blood pressure in response to plan for additional IV diuresis, in setting of acute on chronic systolic HF. In this setting, will also reduce home BB dose for now, as quantified above.   Plan: Close monitoring of subsequent BP via routine VS. will oral Lasix and spironolactone in the setting of plan for IV Lasix as above.  Reducing beta-blocker dose by half, as quantified above.           #) Multiple sclerosis: Documented history of such, for which she is now largely bedbound.  On scheduled baclofen as an outpatient.  Plan: Continue outpatient scheduled baclofen.  Fall precautions.           #) Depression: documented h/o such. On both Wellbutrin and Cymbalta as outpatient.    Plan:  continue home Wellbutrin and Cymbalta.              #) Anemia of chronic disease: Documented history of such while also on daily oral iron supplementation, a/w with baseline hgb range 11-12, with presenting hgb slightly lower than his baseline range, likely as a consequence of hemodilutional influences in the setting of acute on chronic systolic heart failure, as above.  No clinical evidence to suggest active bleed at this time.  Of note, is on daily full dose aspirin at home in the absence of formal anticoagulation.   Plan: Repeat CBC in the morning.  Continue daily oral iron supplementation.            #) Allergic Rhinitis: documented h/o such, on scheduled intranasal Flonase as outpatient.    Plan: cont  home Flonase.             #) Type 2 Diabetes Mellitus: documented history of such. Home insulin regimen: Lantus 5 units subcu daily as well as sliding scale NovoLog 3 times daily with meals. Home oral hypoglycemic agents: None. presenting blood sugar: 110. Most recent A1c noted to be 5.3% in November 2021.  For now, we will hold her very low-dose basal insulin, and closely monitor ensuing cultures, including tomorrow morning's fasting value to determine need for inclusion of basal insulin during this hospitalization.  Plan: accuchecks QAC and HS with low dose SSI.  Hold home Lantus for now, as above.  Add on hemoglobin A1c level.       DVT prophylaxis: SCD's   Code Status: DNR/DNI (consistent with previous hospitalizations CODE STATUS documentation as well as active/on file MOST form) Family Communication: none Disposition  Plan: Per Rounding Team Consults called: EDP d/w on-call cardiology, Dr. Chalmers Cater, with plan for considering cardiology consultation, as further detailed above.;  Admission status: Inpatient     I SPENT GREATER THAN 75  MINUTES IN CLINICAL CARE TIME/MEDICAL DECISION-MAKING IN COMPLETING THIS ADMISSION.      [place] DO [facility]  From [place]   07/01/2022, 9:59 PM

## 2022-07-02 ENCOUNTER — Inpatient Hospital Stay (HOSPITAL_COMMUNITY): Payer: 59

## 2022-07-02 ENCOUNTER — Encounter (HOSPITAL_COMMUNITY): Payer: Self-pay | Admitting: Internal Medicine

## 2022-07-02 DIAGNOSIS — I5023 Acute on chronic systolic (congestive) heart failure: Secondary | ICD-10-CM | POA: Diagnosis not present

## 2022-07-02 DIAGNOSIS — R7989 Other specified abnormal findings of blood chemistry: Secondary | ICD-10-CM | POA: Diagnosis not present

## 2022-07-02 DIAGNOSIS — R0602 Shortness of breath: Secondary | ICD-10-CM

## 2022-07-02 DIAGNOSIS — I10 Essential (primary) hypertension: Secondary | ICD-10-CM | POA: Diagnosis not present

## 2022-07-02 DIAGNOSIS — R609 Edema, unspecified: Secondary | ICD-10-CM

## 2022-07-02 DIAGNOSIS — I5021 Acute systolic (congestive) heart failure: Secondary | ICD-10-CM | POA: Diagnosis not present

## 2022-07-02 DIAGNOSIS — I5043 Acute on chronic combined systolic (congestive) and diastolic (congestive) heart failure: Secondary | ICD-10-CM | POA: Diagnosis not present

## 2022-07-02 LAB — COMPREHENSIVE METABOLIC PANEL
ALT: 17 U/L (ref 0–44)
AST: 16 U/L (ref 15–41)
Albumin: 2.9 g/dL — ABNORMAL LOW (ref 3.5–5.0)
Alkaline Phosphatase: 128 U/L — ABNORMAL HIGH (ref 38–126)
Anion gap: 6 (ref 5–15)
BUN: 10 mg/dL (ref 6–20)
CO2: 30 mmol/L (ref 22–32)
Calcium: 8.4 mg/dL — ABNORMAL LOW (ref 8.9–10.3)
Chloride: 108 mmol/L (ref 98–111)
Creatinine, Ser: 0.8 mg/dL (ref 0.44–1.00)
GFR, Estimated: >60 mL/min (ref >60)
Glucose, Bld: 122 mg/dL — ABNORMAL HIGH (ref 70–99)
Potassium: 3.5 mmol/L (ref 3.5–5.1)
Sodium: 144 mmol/L (ref 135–145)
Total Bilirubin: 0.4 mg/dL (ref 0.3–1.2)
Total Protein: 5.9 g/dL — ABNORMAL LOW (ref 6.5–8.1)

## 2022-07-02 LAB — CBC WITH DIFFERENTIAL/PLATELET
Abs Immature Granulocytes: 0.02 10*3/uL (ref 0.00–0.07)
Basophils Absolute: 0.1 10*3/uL (ref 0.0–0.1)
Basophils Relative: 2 %
Eosinophils Absolute: 0.6 10*3/uL — ABNORMAL HIGH (ref 0.0–0.5)
Eosinophils Relative: 9 %
HCT: 33.8 % — ABNORMAL LOW (ref 36.0–46.0)
Hemoglobin: 10.9 g/dL — ABNORMAL LOW (ref 12.0–15.0)
Immature Granulocytes: 0 %
Lymphocytes Relative: 28 %
Lymphs Abs: 1.9 10*3/uL (ref 0.7–4.0)
MCH: 28.2 pg (ref 26.0–34.0)
MCHC: 32.2 g/dL (ref 30.0–36.0)
MCV: 87.3 fL (ref 80.0–100.0)
Monocytes Absolute: 0.7 10*3/uL (ref 0.1–1.0)
Monocytes Relative: 10 %
Neutro Abs: 3.5 10*3/uL (ref 1.7–7.7)
Neutrophils Relative %: 51 %
Platelets: 269 10*3/uL (ref 150–400)
RBC: 3.87 MIL/uL (ref 3.87–5.11)
RDW: 16.2 % — ABNORMAL HIGH (ref 11.5–15.5)
WBC: 6.8 10*3/uL (ref 4.0–10.5)
nRBC: 0 % (ref 0.0–0.2)

## 2022-07-02 LAB — ECHOCARDIOGRAM COMPLETE
AR max vel: 1.64 cm2
AV Area VTI: 1.6 cm2
AV Area mean vel: 1.22 cm2
AV Mean grad: 4 mmHg
AV Peak grad: 7.2 mmHg
Ao pk vel: 1.34 m/s
Height: 65 in
MV M vel: 4.74 m/s
MV Peak grad: 89.9 mmHg
Radius: 0.5 cm
S' Lateral: 5 cm
Weight: 3520 oz

## 2022-07-02 LAB — PHOSPHORUS: Phosphorus: 3.5 mg/dL (ref 2.5–4.6)

## 2022-07-02 LAB — GLUCOSE, CAPILLARY
Glucose-Capillary: 130 mg/dL — ABNORMAL HIGH (ref 70–99)
Glucose-Capillary: 172 mg/dL — ABNORMAL HIGH (ref 70–99)

## 2022-07-02 LAB — RESP PANEL BY RT-PCR (RSV, FLU A&B, COVID)  RVPGX2
Influenza A by PCR: NEGATIVE
Influenza B by PCR: NEGATIVE
Resp Syncytial Virus by PCR: NEGATIVE
SARS Coronavirus 2 by RT PCR: NEGATIVE

## 2022-07-02 LAB — TROPONIN I (HIGH SENSITIVITY): Troponin I (High Sensitivity): 20 ng/L — ABNORMAL HIGH (ref <18)

## 2022-07-02 LAB — CBG MONITORING, ED
Glucose-Capillary: 167 mg/dL — ABNORMAL HIGH (ref 70–99)
Glucose-Capillary: 89 mg/dL (ref 70–99)
Glucose-Capillary: 103 mg/dL — ABNORMAL HIGH (ref 70–99)

## 2022-07-02 LAB — LACTIC ACID, PLASMA: Lactic Acid, Venous: 1.3 mmol/L (ref 0.5–1.9)

## 2022-07-02 LAB — MAGNESIUM: Magnesium: 1.7 mg/dL (ref 1.7–2.4)

## 2022-07-02 MED ORDER — PERFLUTREN LIPID MICROSPHERE
1.0000 mL | INTRAVENOUS | Status: AC | PRN
  Administered 2022-07-02: 2 mL via INTRAVENOUS

## 2022-07-02 MED ORDER — SPIRONOLACTONE 25 MG PO TABS
25.0000 mg | ORAL_TABLET | Freq: Every day | ORAL | Status: DC
  Administered 2022-07-02 – 2022-07-06 (×4): 25 mg via ORAL
  Filled 2022-07-02 (×5): qty 1

## 2022-07-02 MED ORDER — LOSARTAN POTASSIUM 25 MG PO TABS
25.0000 mg | ORAL_TABLET | Freq: Every day | ORAL | Status: DC
  Administered 2022-07-02: 25 mg via ORAL
  Filled 2022-07-02 (×2): qty 1

## 2022-07-02 NOTE — ED Notes (Signed)
Patient given graham crackers and sprite per request.

## 2022-07-02 NOTE — Progress Notes (Signed)
VASCULAR LAB    Left upper extremity venous duplex has been performed.  See CV proc for preliminary results.   Carlton Sweaney, RVT 07/02/2022, 10:35 AM

## 2022-07-02 NOTE — ED Notes (Signed)
ED TO INPATIENT HANDOFF REPORT  ED Nurse Name and Phone #: [phone]  S Name/Age/Gender Shannon Garrett 62 y.o. female Room/Bed: 012C/012C  Code Status   Code Status: DNR  Home/SNF/Other Nursing Home Patient oriented to: self, place, time, and situation Is this baseline? Yes   Triage Complete: Triage complete  Chief Complaint Acute on chronic systolic heart failure ([place]) [I50.23]  Triage Note PT BIB EMS from Nursing facility for SOB and Lethargy x 1 day. Per EMS pt is on 2.5l o2 at all times and unable to keep sats up. Pt placed on NRB at 15l via by EMS, crackles heard in bases of lunch and diaphoresis. Pt A&O x 4 during triage.    Allergies Allergies  Allergen Reactions   Sulfonamide Derivatives Hives and Shortness Of Breath   Penicillins Hives and Itching    Prior course of rocephin charted 05/2015 Also note has tolerated Zosyn in past 2016 and 2017    Level of Care/Admitting Diagnosis ED Disposition     ED Disposition  Admit   Condition  --   [place]: [facility] [[id number]]  Level of Care: Telemetry Cardiac [[id number]]  May admit patient to [facility] or [facility] if equivalent level of care is available:: No  Covid Evaluation: Asymptomatic - no recent exposure (last 10 days) testing not required  Diagnosis: Acute on chronic systolic heart failure ([place]) [428.23.ICD-9-CM]  Admitting Physician: Rhetta Mura [[id number]]  Attending Physician: Rhetta Mura [[id number]]  Certification:: I certify this patient will need inpatient services for at least 2 midnights  Estimated Length of Stay: 2          B Medical/Surgery History Past Medical History:  Diagnosis Date   Adrenal insufficiency ([place])    Anemia of chronic disease    Aortic atherosclerosis ([place])    COVID-19 05/10/2019   Per facility paperwork   Decubitus ulcer    stage II sacral decubitus ulcer.    Depression    DM2 (diabetes mellitus, type 2) ([place])    Glaucoma     Glaucoma    HFrEF (heart failure with reduced ejection fraction) ([place])    TTE 04/2012: EF 20-25 // TTE 08/2012: EF 50-55 // Cath 10/19/19: Normal Cors // TTE 05/20/20: EF 35-40, trivial MR, mild AI   Hypertension    MS (multiple sclerosis) ([place])    a. Dx'd late 20's. b. Tx with Novantrone, Tysabri, Copaxone previously.   NICM (nonischemic cardiomyopathy) ([place])    cath 10/2019: normal cors // probably due to multiple chemo-Tx agents used for MS   SVT (supraventricular tachycardia)    Past Surgical History:  Procedure Laterality Date   ABLATION     uterine   BILIARY DILATION  02/15/2020   Procedure: BILIARY DILATION;  Surgeon: Rush Landmark Telford Nab., MD;  Location: [facility];  Service: Gastroenterology;;   CESAREAN SECTION     CHOLECYSTECTOMY N/A 07/20/2019   Procedure: DIAGNOSTIC LAPAROSCOPY;  Surgeon: [name], MD;  Location: [facility];  Service: General;  Laterality: N/A;   CHOLECYSTECTOMY N/A 02/12/2020   Procedure: LAPAROSCOPIC CHOLECYSTECTOMY;  Surgeon: Clovis Riley, MD;  Location: [facility];  Service: General;  Laterality: N/A;   ERCP N/A 02/15/2020   Procedure: ENDOSCOPIC RETROGRADE CHOLANGIOPANCREATOGRAPHY (ERCP);  Surgeon: Irving Copas., MD;  Location: [facility];  Service: Gastroenterology;  Laterality: N/A;   IR EXCHANGE BILIARY DRAIN  09/07/2019   IR PERC CHOLECYSTOSTOMY  07/21/2019   LEFT HEART CATH AND CORONARY ANGIOGRAPHY N/A 10/29/2019  Procedure: LEFT HEART CATH AND CORONARY ANGIOGRAPHY;  Surgeon: Leonie Man, MD;  Location: [facility];  Service: Cardiovascular;  Laterality: N/A;   REMOVAL OF STONES  02/15/2020   Procedure: REMOVAL OF STONES;  Surgeon: Rush Landmark Telford Nab., MD;  Location: [facility];  Service: Gastroenterology;;   Joan Mayans  02/15/2020   Procedure: Joan Mayans;  Surgeon: Mansouraty, Telford Nab., MD;  Location: [facility];  Service: Gastroenterology;;     A IV Location/Drains/Wounds Patient Lines/Drains/Airways  Status     Active Line/Drains/Airways     Name Placement date Placement time Site Days   Peripheral IV 07/01/22 22 G 2.5" Anterior;Right;Lateral Forearm 07/01/22  1914  Forearm  1   Closed System Drain 1 Right LLQ Bulb (JP) 19 Fr. 02/15/20  0000  LLQ  868   Suprapubic Catheter 16 Fr. 05/06/21  2135  --  422   Incision - 2 Ports Abdomen Left;Upper 2: Mid;Lower 07/20/19  1320  -- 1078   Incision - 5 Ports Abdomen Upper;Umbilicus Upper;Mid Left;Upper Left;Lower Right;Mid 02/12/20  1157  -- 871   Pressure Injury 05/11/19 Buttocks Left;Posterior Stage II -  Partial thickness loss of dermis presenting as a shallow open ulcer with a red, pink wound bed without slough. stage two  05/11/19  1015  -- 1148   Pressure Injury 07/02/22 Sacrum Unstageable - Full thickness tissue loss in which the base of the injury is covered by slough (yellow, tan, gray, green or brown) and/or eschar (tan, brown or black) in the wound bed. 07/02/22  0756  -- less than 1            Intake/Output Last 24 hours  Intake/Output Summary (Last 24 hours) at 07/02/2022 1359 Last data filed at 07/02/2022 1103 Gross per 24 hour  Intake 600 ml  Output 3000 ml  Net -2400 ml    Labs/Imaging Results for orders placed or performed during the hospital encounter of 07/01/22 (from the past 48 hour(s))  CBG monitoring, ED     Status: Abnormal   Collection Time: 07/01/22  4:04 PM  Result Value Ref Range   Glucose-Capillary 121 (H) 70 - 99 mg/dL    Comment: Glucose reference range applies only to samples taken after fasting for at least 8 hours.  CBC with Differential     Status: Abnormal   Collection Time: 07/01/22  4:16 PM  Result Value Ref Range   WBC 6.7 4.0 - 10.5 K/uL   RBC 3.76 (L) 3.87 - 5.11 MIL/uL   Hemoglobin 10.8 (L) 12.0 - 15.0 g/dL   HCT 33.0 (L) 36.0 - 46.0 %   MCV 87.8 80.0 - 100.0 fL   MCH 28.7 26.0 - 34.0 pg   MCHC 32.7 30.0 - 36.0 g/dL   RDW 16.3 (H) 11.5 - 15.5 %   Platelets 288 150 - 400 K/uL   nRBC 0.0  0.0 - 0.2 %   Neutrophils Relative % 59 %   Neutro Abs 3.9 1.7 - 7.7 K/uL   Lymphocytes Relative 24 %   Lymphs Abs 1.6 0.7 - 4.0 K/uL   Monocytes Relative 8 %   Monocytes Absolute 0.5 0.1 - 1.0 K/uL   Eosinophils Relative 8 %   Eosinophils Absolute 0.6 (H) 0.0 - 0.5 K/uL   Basophils Relative 1 %   Basophils Absolute 0.1 0.0 - 0.1 K/uL   Immature Granulocytes 0 %   Abs Immature Granulocytes 0.02 0.00 - 0.07 K/uL    Comment: Performed at [facility], [address] [place], [place] [address]  Comprehensive metabolic panel     Status: Abnormal   Collection Time: 07/01/22  4:16 PM  Result Value Ref Range   Sodium 145 135 - 145 mmol/L   Potassium 4.1 3.5 - 5.1 mmol/L   Chloride 108 98 - 111 mmol/L   CO2 32 22 - 32 mmol/L   Glucose, Bld 110 (H) 70 - 99 mg/dL    Comment: Glucose reference range applies only to samples taken after fasting for at least 8 hours.   BUN 10 6 - 20 mg/dL   Creatinine, Ser 0.87 0.44 - 1.00 mg/dL   Calcium 8.4 (L) 8.9 - 10.3 mg/dL   Total Protein 5.7 (L) 6.5 - 8.1 g/dL   Albumin 2.8 (L) 3.5 - 5.0 g/dL   AST 17 15 - 41 U/L   ALT 17 0 - 44 U/L   Alkaline Phosphatase 126 38 - 126 U/L   Total Bilirubin 0.5 0.3 - 1.2 mg/dL   GFR, Estimated >60 >60 mL/min    Comment: (NOTE) Calculated using the CKD-EPI Creatinine Equation (2021)    Anion gap 5 5 - 15    Comment: Performed at [facility], [place] [address] [place], [place] [address]  Troponin I (High Sensitivity)     Status: Abnormal   Collection Time: 07/01/22  4:16 PM  Result Value Ref Range   Troponin I (High Sensitivity) 18 (H) <18 ng/L    Comment: (NOTE) Elevated high sensitivity troponin I (hsTnI) values and significant  changes across serial measurements may suggest ACS but many other  chronic and acute conditions are known to elevate hsTnI results.  Refer to the "Links" section for chest pain algorithms and additional  guidance. Performed at [facility], [place] [address],  [place], [place] [address]   Brain natriuretic peptide     Status: Abnormal   Collection Time: 07/01/22  4:17 PM  Result Value Ref Range   B Natriuretic Peptide 3,102.7 (H) 0.0 - 100.0 pg/mL    Comment: Performed at [facility] [address] [place], [place] [address]  Troponin I (High Sensitivity)     Status: Abnormal   Collection Time: 07/01/22  6:17 PM  Result Value Ref Range   Troponin I (High Sensitivity) 19 (H) <18 ng/L    Comment: (NOTE) Elevated high sensitivity troponin I (hsTnI) values and significant  changes across serial measurements may suggest ACS but many other  chronic and acute conditions are known to elevate hsTnI results.  Refer to the "Links" section for chest pain algorithms and additional  guidance. Performed at [facility], [place] [address] [place], [place] [address]   Magnesium     Status: None   Collection Time: 07/01/22  6:17 PM  Result Value Ref Range   Magnesium 1.8 1.7 - 2.4 mg/dL    Comment: Performed at [facility] [address] [place], [place] [address]  Procalcitonin - Baseline     Status: None   Collection Time: 07/01/22  6:17 PM  Result Value Ref Range   Procalcitonin <0.10 ng/mL    Comment:        Interpretation: PCT (Procalcitonin) <= 0.5 ng/mL: Systemic infection (sepsis) is not likely. Local bacterial infection is possible. (NOTE)       Sepsis PCT Algorithm           Lower Respiratory Tract  Infection PCT Algorithm    ----------------------------     ----------------------------         PCT < 0.25 ng/mL                PCT < 0.10 ng/mL          Strongly encourage             Strongly discourage   discontinuation of antibiotics    initiation of antibiotics    ----------------------------     -----------------------------       PCT 0.25 - 0.50 ng/mL            PCT 0.10 - 0.25 ng/mL               OR       >80% decrease in PCT            Discourage initiation of                                             antibiotics      Encourage discontinuation           of antibiotics    ----------------------------     -----------------------------         PCT >= 0.50 ng/mL              PCT 0.26 - 0.50 ng/mL               AND        <80% decrease in PCT             Encourage initiation of                                             antibiotics       Encourage continuation           of antibiotics    ----------------------------     -----------------------------        PCT >= 0.50 ng/mL                  PCT > 0.50 ng/mL               AND         increase in PCT                  Strongly encourage                                      initiation of antibiotics    Strongly encourage escalation           of antibiotics                                     -----------------------------                                           PCT <= 0.25 ng/mL  OR                                        > 80% decrease in PCT                                      Discontinue / Do not initiate                                             antibiotics  Performed at [facility], [place] [address] [place], [place] [address]   I-Stat venous blood gas, ED     Status: Abnormal   Collection Time: 07/01/22  7:26 PM  Result Value Ref Range   pH, Ven 7.333 7.25 - 7.43   pCO2, Ven 61.6 (H) 44 - 60 mmHg   pO2, Ven 164 (H) 32 - 45 mmHg   Bicarbonate 32.7 (H) 20.0 - 28.0 mmol/L   TCO2 35 (H) 22 - 32 mmol/L   O2 Saturation 99 %   Acid-Base Excess 5.0 (H) 0.0 - 2.0 mmol/L   Sodium 147 (H) 135 - 145 mmol/L   Potassium 4.0 3.5 - 5.1 mmol/L   Calcium, Ion 1.16 1.15 - 1.40 mmol/L   HCT 33.0 (L) 36.0 - 46.0 %   Hemoglobin 11.2 (L) 12.0 - 15.0 g/dL   Sample type VENOUS   CBC with Differential/Platelet     Status: Abnormal   Collection Time: 07/02/22  5:00 AM  Result Value Ref Range   WBC 6.8 4.0 - 10.5 K/uL   RBC 3.87 3.87 - 5.11 MIL/uL   Hemoglobin 10.9  (L) 12.0 - 15.0 g/dL   HCT 33.8 (L) 36.0 - 46.0 %   MCV 87.3 80.0 - 100.0 fL   MCH 28.2 26.0 - 34.0 pg   MCHC 32.2 30.0 - 36.0 g/dL   RDW 16.2 (H) 11.5 - 15.5 %   Platelets 269 150 - 400 K/uL   nRBC 0.0 0.0 - 0.2 %   Neutrophils Relative % 51 %   Neutro Abs 3.5 1.7 - 7.7 K/uL   Lymphocytes Relative 28 %   Lymphs Abs 1.9 0.7 - 4.0 K/uL   Monocytes Relative 10 %   Monocytes Absolute 0.7 0.1 - 1.0 K/uL   Eosinophils Relative 9 %   Eosinophils Absolute 0.6 (H) 0.0 - 0.5 K/uL   Basophils Relative 2 %   Basophils Absolute 0.1 0.0 - 0.1 K/uL   Immature Granulocytes 0 %   Abs Immature Granulocytes 0.02 0.00 - 0.07 K/uL    Comment: Performed at [facility], [place] [address] [place], [place] [address]  Comprehensive metabolic panel     Status: Abnormal   Collection Time: 07/02/22  5:00 AM  Result Value Ref Range   Sodium 144 135 - 145 mmol/L   Potassium 3.5 3.5 - 5.1 mmol/L   Chloride 108 98 - 111 mmol/L   CO2 30 22 - 32 mmol/L   Glucose, Bld 122 (H) 70 - 99 mg/dL    Comment: Glucose reference range applies only to samples taken after fasting for at least 8 hours.   BUN 10 6 - 20 mg/dL   Creatinine, Ser 0.80 0.44 -  1.00 mg/dL   Calcium 8.4 (L) 8.9 - 10.3 mg/dL   Total Protein 5.9 (L) 6.5 - 8.1 g/dL   Albumin 2.9 (L) 3.5 - 5.0 g/dL   AST 16 15 - 41 U/L   ALT 17 0 - 44 U/L   Alkaline Phosphatase 128 (H) 38 - 126 U/L   Total Bilirubin 0.4 0.3 - 1.2 mg/dL   GFR, Estimated >60 >60 mL/min    Comment: (NOTE) Calculated using the CKD-EPI Creatinine Equation (2021)    Anion gap 6 5 - 15    Comment: Performed at [facility] [address] [place], [place] [address]  Magnesium     Status: None   Collection Time: 07/02/22  5:00 AM  Result Value Ref Range   Magnesium 1.7 1.7 - 2.4 mg/dL    Comment: Performed at [facility], [place] [address] [place], [place] [address]  Phosphorus     Status: None   Collection Time: 07/02/22  5:00 AM  Result Value Ref Range    Phosphorus 3.5 2.5 - 4.6 mg/dL    Comment: Performed at [facility] [address], [place], [place] [address]  Troponin I (High Sensitivity)     Status: Abnormal   Collection Time: 07/02/22  5:00 AM  Result Value Ref Range   Troponin I (High Sensitivity) 20 (H) <18 ng/L    Comment: (NOTE) Elevated high sensitivity troponin I (hsTnI) values and significant  changes across serial measurements may suggest ACS but many other  chronic and acute conditions are known to elevate hsTnI results.  Refer to the "Links" section for chest pain algorithms and additional  guidance. Performed at [facility], [place] [address] [place], [place] [address]   CBG monitoring, ED     Status: Abnormal   Collection Time: 07/02/22  5:36 AM  Result Value Ref Range   Glucose-Capillary 167 (H) 70 - 99 mg/dL    Comment: Glucose reference range applies only to samples taken after fasting for at least 8 hours.  CBG monitoring, ED     Status: None   Collection Time: 07/02/22  7:30 AM  Result Value Ref Range   Glucose-Capillary 89 70 - 99 mg/dL    Comment: Glucose reference range applies only to samples taken after fasting for at least 8 hours.  CBG monitoring, ED     Status: Abnormal   Collection Time: 07/02/22 11:22 AM  Result Value Ref Range   Glucose-Capillary 103 (H) 70 - 99 mg/dL    Comment: Glucose reference range applies only to samples taken after fasting for at least 8 hours.  Lactic acid, plasma     Status: None   Collection Time: 07/02/22 11:37 AM  Result Value Ref Range   Lactic Acid, Venous 1.3 0.5 - 1.9 mmol/L    Comment: Performed at [facility] [address], [place],  [address]   VAS [place] LOWER EXTREMITY VENOUS (DVT)  Result Date: 07/02/2022  Lower Venous DVT Study Patient Name:  Shannon Garrett  Date of Exam:   07/02/2022 Medical Rec #: [id number]      Accession #:    [id number] Date of Birth: [birth year]      Patient Gender: F Patient Age:   10 years Exam Location:  [facility] Procedure:      VAS [place] LOWER EXTREMITY VENOUS (DVT) Referring Phys: A POWELL JR --------------------------------------------------------------------------------  Indications: Edema, and SOB.  Risk Factors: Heart failure, EF 20-25%.  Limitations: Body habitus and MS, reduced EF, slow breathing. Comparison Study: Prior negative bilateral LEV done 10/14/13 Performing Technologist: Sharion Dove RVS  Examination Guidelines: A complete evaluation includes B-mode imaging, spectral Doppler, color Doppler, and power Doppler as needed of all accessible portions of each vessel. Bilateral testing is considered an integral part of a complete examination. Limited examinations for reoccurring indications may be performed as noted. The reflux portion of the exam is performed with the patient in reverse Trendelenburg.  +---------+---------------+---------+-----------+----------+-------------------+ RIGHT    CompressibilityPhasicitySpontaneityPropertiesThrombus Aging      +---------+---------------+---------+-----------+----------+-------------------+ CFV      Full                                                             +---------+---------------+---------+-----------+----------+-------------------+ SFJ      Full                                                             +---------+---------------+---------+-----------+----------+-------------------+ FV Prox  Full                                                             +---------+---------------+---------+-----------+----------+-------------------+ FV Mid   Full           Yes      Yes                                      +---------+---------------+---------+-----------+----------+-------------------+ FV DistalFull                                                             +---------+---------------+---------+-----------+----------+-------------------+ PFV      Full                                                              +---------+---------------+---------+-----------+----------+-------------------+ POP      Full           Yes      Yes                                      +---------+---------------+---------+-----------+----------+-------------------+ PTV  Not well visualized +---------+---------------+---------+-----------+----------+-------------------+ PERO                                                  Not well visualized +---------+---------------+---------+-----------+----------+-------------------+   +---------+---------------+---------+-----------+----------+-------------------+ LEFT     CompressibilityPhasicitySpontaneityPropertiesThrombus Aging      +---------+---------------+---------+-----------+----------+-------------------+ CFV      Full                                                             +---------+---------------+---------+-----------+----------+-------------------+ SFJ      Full                                                             +---------+---------------+---------+-----------+----------+-------------------+ FV Prox  Full                                                             +---------+---------------+---------+-----------+----------+-------------------+ FV Mid   Full                                                             +---------+---------------+---------+-----------+----------+-------------------+ FV DistalFull                                                             +---------+---------------+---------+-----------+----------+-------------------+ POP      Full                                                             +---------+---------------+---------+-----------+----------+-------------------+ PTV                                                   Not well visualized  +---------+---------------+---------+-----------+----------+-------------------+ PERO                                                  Not well visualized +---------+---------------+---------+-----------+----------+-------------------+     Summary: RIGHT: - There is no evidence of deep vein thrombosis in the lower  extremity. However, portions of this examination were limited- see technologist comments above.  - No cystic structure found in the popliteal fossa.  LEFT: - There is no evidence of deep vein thrombosis in the lower extremity. However, portions of this examination were limited- see technologist comments above.  - No cystic structure found in the popliteal fossa.  *See table(s) above for measurements and observations.    Preliminary    VAS [place] UPPER EXTREMITY VENOUS DUPLEX  Result Date: 07/02/2022 UPPER VENOUS STUDY  Patient Name:  Shannon Garrett  Date of Exam:   07/02/2022 Medical Rec #: [id number]      Accession #:    [id number] Date of Birth: [birth year]      Patient Gender: F Patient Age:   66 years Exam [facility]:  [facility] Procedure:      VAS [place] UPPER EXTREMITY VENOUS DUPLEX Referring Phys: A POWELL JR --------------------------------------------------------------------------------  Indications: Edema Risk Factors: MS, Heart failure. Limitations: Body habitus, MS. Comparison Study: No prior study on file Performing Technologist: Sharion Dove RVS  Examination Guidelines: A complete evaluation includes B-mode imaging, spectral Doppler, color Doppler, and power Doppler as needed of all accessible portions of each vessel. Bilateral testing is considered an integral part of a complete examination. Limited examinations for reoccurring indications may be performed as noted.  Left Findings: +----------+------------+---------+-----------+----------+---------------+ LEFT      CompressiblePhasicitySpontaneousProperties    Summary      +----------+------------+---------+-----------+----------+---------------+ IJV           Full       Yes       Yes                              +----------+------------+---------+-----------+----------+---------------+ Subclavian    Full                                                  +----------+------------+---------+-----------+----------+---------------+ Axillary                 Yes       Yes                              +----------+------------+---------+-----------+----------+---------------+ Brachial                                            patent by color +----------+------------+---------+-----------+----------+---------------+ Radial                                              Not visualized  +----------+------------+---------+-----------+----------+---------------+ Ulnar                                               Not visualized  +----------+------------+---------+-----------+----------+---------------+ Cephalic  patent by color +----------+------------+---------+-----------+----------+---------------+ Basilic                                             Not visualized  +----------+------------+---------+-----------+----------+---------------+  Summary:  Left: No obvious evidence of deep vein thrombosis or superficial vein thrombosis in the visualized veins of the upper extremity.  *See table(s) above for measurements and observations.    Preliminary    ECHOCARDIOGRAM COMPLETE  Result Date: 07/02/2022    ECHOCARDIOGRAM REPORT   Patient Name:   Shannon Garrett Date of Exam: 07/02/2022 Medical Rec #:  [id number]     Height:       65.0 in Accession #:    [id number]    Weight:       220.0 lb Date of Birth:  1960-12-11     BSA:          2.060 m Patient Age:    62 years      BP:           133/71 mmHg Patient Gender: F             HR:           87 bpm. Exam Location:  [facility] Procedure: 2D Echo, Cardiac  Doppler, Color Doppler and Intracardiac            Opacification Agent Indications:    CHF-acute systolic  History:        Patient has prior history of Echocardiogram examinations, most                 recent 05/20/2020. CHF; Risk Factors:Diabetes, Hypertension,                 Dyslipidemia and Former Smoker. Multiple sclerosis. Acute                 hypoxic respiratory therapy. Elevated troponin. GERD.  Sonographer:    Clayton Lefort RDCS (AE) Referring Phys: Rhetta Mura  Sonographer Comments: Patient is obese. Image acquisition challenging due to patient body habitus. Limited mobility. IMPRESSIONS  1. Left ventricular ejection fraction, by estimation, is 20 to 25%. The left ventricle has severely decreased function. The left ventricle demonstrates global hypokinesis. The left ventricular internal cavity size was severely dilated. There is mild left ventricular hypertrophy. Left ventricular diastolic parameters are indeterminate.  2. Right ventricular systolic function was not well visualized. The right ventricular size is not well visualized. Poor RV visualization but appears grossly normal size and systolic function. There is severely elevated pulmonary artery systolic pressure. The estimated right ventricular systolic pressure is 50.3 mmHg.  3. Left atrial size was moderately dilated.  4. The mitral valve is abnormal. Moderate to severe mitral valve regurgitation. Appears functional  5. Tricuspid valve regurgitation is moderate.  6. The aortic valve was not well visualized. Aortic valve regurgitation is trivial. No aortic stenosis is present.  7. The inferior vena cava is dilated in size with <50% respiratory variability, suggesting right atrial pressure of 15 mmHg. FINDINGS  Left Ventricle: Left ventricular ejection fraction, by estimation, is 20 to 25%. The left ventricle has severely decreased function. The left ventricle demonstrates global hypokinesis. The left ventricular internal cavity size was  severely dilated. There is mild left ventricular hypertrophy. Left ventricular diastolic parameters are indeterminate. Right Ventricle: The right ventricular size is not well visualized. Right vetricular wall thickness was not  well visualized. Right ventricular systolic function was not well visualized. There is severely elevated pulmonary artery systolic pressure. The tricuspid regurgitant velocity is 3.96 m/s, and with an assumed right atrial pressure of 15 mmHg, the estimated right ventricular systolic pressure is 09.3 mmHg. Left Atrium: Left atrial size was moderately dilated. Right Atrium: Right atrial size was normal in size. Pericardium: There is no evidence of pericardial effusion. Mitral Valve: The mitral valve is abnormal. Moderate to severe mitral valve regurgitation. Tricuspid Valve: The tricuspid valve is normal in structure. Tricuspid valve regurgitation is moderate. Aortic Valve: The aortic valve was not well visualized. Aortic valve regurgitation is trivial. No aortic stenosis is present. Aortic valve mean gradient measures 4.0 mmHg. Aortic valve peak gradient measures 7.2 mmHg. Aortic valve area, by VTI measures 1.60 cm. Pulmonic Valve: The pulmonic valve was not well visualized. Pulmonic valve regurgitation is not visualized. Aorta: The aortic root is normal in size and structure. Venous: The inferior vena cava is dilated in size with less than 50% respiratory variability, suggesting right atrial pressure of 15 mmHg. IAS/Shunts: The interatrial septum was not well visualized.  LEFT VENTRICLE PLAX 2D LVIDd:         5.70 cm LVIDs:         5.00 cm LV PW:         1.30 cm LV IVS:        0.80 cm LVOT diam:     1.80 cm LV SV:         30 LV SV Index:   14 LVOT Area:     2.54 cm  RIGHT VENTRICLE             IVC RV Basal diam:  3.40 cm     IVC diam: 2.20 cm RV S prime:     11.10 cm/s TAPSE (M-mode): 1.9 cm LEFT ATRIUM             Index        RIGHT ATRIUM           Index LA diam:        4.70 cm 2.28 cm/m    RA Area:     17.10 cm LA Vol (A2C):   81.6 ml 39.61 ml/m  RA Volume:   50.00 ml  24.27 ml/m LA Vol (A4C):   83.4 ml 40.49 ml/m LA Biplane Vol: 85.1 ml 41.31 ml/m  AORTIC VALVE AV Area (Vmax):    1.64 cm AV Area (Vmean):   1.22 cm AV Area (VTI):     1.60 cm AV Vmax:           134.00 cm/s AV Vmean:          92.400 cm/s AV VTI:            0.185 m AV Peak Grad:      7.2 mmHg AV Mean Grad:      4.0 mmHg LVOT Vmax:         86.60 cm/s LVOT Vmean:        44.200 cm/s LVOT VTI:          0.116 m LVOT/AV VTI ratio: 0.63  AORTA Ao Root diam: 3.00 cm MR Peak grad:    89.9 mmHg    TRICUSPID VALVE MR Mean grad:    52.0 mmHg    TR Peak grad:   62.7 mmHg MR Vmax:         474.00 cm/s  TR Vmax:  396.00 cm/s MR Vmean:        333.0 cm/s MR PISA:         1.57 cm     SHUNTS MR PISA Eff ROA: 13 mm       Systemic VTI:  0.12 m MR PISA Radius:  0.50 cm      Systemic Diam: 1.80 cm Oswaldo Milian MD Electronically signed by Oswaldo Milian MD Signature Date/Time: 07/02/2022/10:36:02 AM    Final    DG Chest Portable 1 View  Result Date: 07/01/2022 CLINICAL DATA:  Shortness of breath EXAM: PORTABLE CHEST 1 VIEW COMPARISON:  05/01/2022 FINDINGS: Cardiomegaly with vascular congestion, mild edema and probable pleural effusions. Basilar consolidations. No pneumothorax IMPRESSION: Cardiomegaly with vascular congestion, mild edema and probable pleural effusions. Basilar consolidations may be due to atelectasis or pneumonia. Electronically Signed   By: Donavan Foil M.D.   On: 07/01/2022 16:59    Pending Labs Unresulted Labs (From admission, onward)     Start     Ordered   07/03/22 2956  Basic metabolic panel  Tomorrow morning,   R       Question:  Specimen collection method  Answer:  IV Team=IV Team collect   07/02/22 0921   07/03/22 0500  CBC  Tomorrow morning,   R       Question:  Specimen collection method  Answer:  IV Team=IV Team collect   07/02/22 0951   07/03/22 0500  Magnesium  Tomorrow morning,   R        Question:  Specimen collection method  Answer:  IV Team=IV Team collect   07/02/22 0951   07/03/22 0500  Phosphorus  Tomorrow morning,   R       Question:  Specimen collection method  Answer:  IV Team=IV Team collect   07/02/22 0951   07/03/22 0500  Hepatic function panel  Tomorrow morning,   R       Question:  Specimen collection method  Answer:  IV Team=IV Team collect   07/02/22 0951   07/01/22 2156  Hemoglobin A1c  Add-on,   AD       Comments: To assess prior glycemic control   Question:  Specimen collection method  Answer:  IV Team=IV Team collect   07/01/22 2155   07/01/22 1616  Blood gas, venous  Once,   R        07/01/22 1617            Vitals/Pain Today's Vitals   07/02/22 0900 07/02/22 1045 07/02/22 1100 07/02/22 1200  BP: (!) 143/61 136/65 127/82 (!) 141/70  Pulse: 81 88 92 89  Resp: (!) 22 (!) 21 (!) 26 (!) 30  Temp:    98 F (36.7 C)  TempSrc:      SpO2: 100% 100% 100% 100%  Weight:      Height:      PainSc:        Isolation Precautions No active isolations  Medications Medications  acetaminophen (TYLENOL) tablet 650 mg (650 mg Oral Given 07/02/22 0734)    Or  acetaminophen (TYLENOL) suppository 650 mg ( Rectal See Alternative 07/02/22 0734)  melatonin tablet 3 mg (has no administration in time range)  furosemide (LASIX) injection 40 mg (40 mg Intravenous Given 07/02/22 0731)  metoprolol succinate (TOPROL-XL) 24 hr tablet 75 mg (75 mg Oral Given 07/02/22 1041)  insulin aspart (novoLOG) injection 0-9 Units ( Subcutaneous Not Given 07/02/22 1124)  albuterol (PROVENTIL) (2.5 MG/3ML) 0.083% nebulizer solution 2.5 mg (  2.5 mg Nebulization Given 07/02/22 0737)  amantadine (SYMMETREL) capsule 100 mg (100 mg Oral Given 07/02/22 1123)  aspirin EC tablet 325 mg (325 mg Oral Given 07/02/22 1042)  baclofen (LIORESAL) tablet 10 mg (10 mg Oral Given 07/02/22 1045)  buPROPion (WELLBUTRIN XL) 24 hr tablet 150 mg (150 mg Oral Given 07/02/22 1042)  docusate sodium (COLACE) capsule 100  mg (100 mg Oral Given 07/02/22 1042)  DULoxetine (CYMBALTA) DR capsule 60 mg (60 mg Oral Given 07/02/22 1041)  ferrous sulfate tablet 325 mg (325 mg Oral Given 07/02/22 0731)  fluticasone (FLONASE) 50 MCG/ACT nasal spray 1 spray (1 spray Each Nare Patient Refused/Not Given 07/02/22 0032)  gabapentin (NEURONTIN) capsule 800 mg (800 mg Oral Given 07/02/22 1042)  latanoprost (XALATAN) 0.005 % ophthalmic solution 1 drop (1 drop Both Eyes Patient Refused/Not Given 07/02/22 0033)  linaclotide (LINZESS) capsule 290 mcg (290 mcg Oral Given 07/02/22 0731)  polyethylene glycol (MIRALAX / GLYCOLAX) packet 17 g (has no administration in time range)  potassium chloride SA (KLOR-CON M) CR tablet 20 mEq (20 mEq Oral Given 07/02/22 1041)  senna-docusate (Senokot-S) tablet 2 tablet (2 tablets Oral Given 07/02/22 1041)  simvastatin (ZOCOR) tablet 10 mg (10 mg Oral Given 07/02/22 0024)  traMADol (ULTRAM) tablet 100 mg (100 mg Oral Given 07/02/22 0735)  spironolactone (ALDACTONE) tablet 25 mg (25 mg Oral Given 07/02/22 1045)  losartan (COZAAR) tablet 25 mg (25 mg Oral Given 07/02/22 1045)  perflutren lipid microspheres (DEFINITY) IV suspension (2 mLs Intravenous Given 07/02/22 1007)  furosemide (LASIX) injection 40 mg (40 mg Intravenous Given 07/01/22 2050)  acetaminophen (TYLENOL) tablet 1,000 mg (1,000 mg Oral Given 07/01/22 2114)    Mobility non-ambulatory Moderate fall risk   Focused Assessments Pulmonary Assessment Handoff:  Lung sounds: Bilateral Breath Sounds: Rhonchi, Coarse crackles O2 Device: Nasal Cannula O2 Flow Rate (L/min): 3 L/min    R Recommendations: See Admitting Provider Note  Report given to:   Additional Notes:

## 2022-07-02 NOTE — Consult Note (Addendum)
Cardiology Consultation   Patient ID: Shannon Garrett MRN: [id number]; DOB: [birth year]  Admit date: 07/01/2022 Date of Consult: 07/02/2022  PCP:  Merryl Hacker No   [facility] Providers Cardiologist:  Peter Martinique, MD        Patient Profile:   Shannon Garrett is a 62 y.o. female with a hx of (HFrEF) heart failure with reduced ejection fraction secondary to non-ischemic cardiomyopathy, hypertension, diabetes mellitus, SVT, multiple sclerosis, adrenal insufficiency, depression, glaucoma, GERD, obesity, aortic atherosclerosis, anemia of chronic disease, intermittent left bundle branch block who is being seen 07/02/2022 for the evaluation of decompensated heart failure at the request of Dr Velia Meyer.  History of Present Illness:   Shannon Garrett has a history of HFrEF secondary to nonischemic cardiomyopathy.  Cardiac catheterization in April 2021 demonstrated normal coronary arteries.  TTE 05/20/2020: EF 35-40, global HK, normal RVSF, trivial MR, mild AI, RAP 3.  She was last seen in clinic 11/03/2020 prior to gallbladder surgery.  She presented the emergency room last night with progressive shortness of breath, orthopnea and lower extremity edema.  Data: K 3.5, Creatinine 0.8, ALT 17, Total Protein 5.9, Alb 2.9, Magnesium 1.7, Hgb 10.9 hsTroponin 18>>19>>20 BNP 3107.7  CXR: + CHF CKG - NSR, HR 77, Left Bundle Branch Block   She was given a dose of IV Lasix in the emergency room.  Cardiology is asked to further evaluate.   Shannon Garrett is a resident of [facility]. She is wheelchair bound due to her Shannon. She notes increasing weight (~20+ lbs) over the past several mos. She notes worsening shortness of breath with minimal activity. She uses O2 at night. She notes orthopnea and reports sleeping sitting up. She has done this for a long time now. She has not had chest pain. She was told that she was found unresponsive at her facility several days ago. She had apparently vomited. Details are not clear  about this.   Past Medical History:  Diagnosis Date   Adrenal insufficiency ([place])    Anemia of chronic disease    Aortic atherosclerosis ([place])    COVID-19 05/10/2019   Per facility paperwork   Decubitus ulcer    stage II sacral decubitus ulcer.    Depression    DM2 (diabetes mellitus, type 2) ([place])    Glaucoma    Glaucoma    HFrEF (heart failure with reduced ejection fraction) ([place])    TTE 04/2012: EF 20-25 // TTE 08/2012: EF 50-55 // Cath 10/19/19: Normal Cors // TTE 05/20/20: EF 35-40, trivial MR, mild AI   Hypertension    Shannon (multiple sclerosis) ([place])    a. Dx'd late 20's. b. Tx with Novantrone, Tysabri, Copaxone previously.   NICM (nonischemic cardiomyopathy) ([place])    cath 10/2019: normal cors // probably due to multiple chemo-Tx agents used for Shannon   SVT (supraventricular tachycardia)     Past Surgical History:  Procedure Laterality Date   ABLATION     uterine   BILIARY DILATION  02/15/2020   Procedure: BILIARY DILATION;  Surgeon: Rush Landmark Telford Nab., MD;  Location: [facility];  Service: Gastroenterology;;   CESAREAN SECTION     CHOLECYSTECTOMY N/A 07/20/2019   Procedure: DIAGNOSTIC LAPAROSCOPY;  Surgeon: [name], MD;  Location: [facility];  Service: General;  Laterality: N/A;   CHOLECYSTECTOMY N/A 02/12/2020   Procedure: LAPAROSCOPIC CHOLECYSTECTOMY;  Surgeon: Clovis Riley, MD;  Location: [facility];  Service: General;  Laterality: N/A;   ERCP N/A 02/15/2020   Procedure: ENDOSCOPIC RETROGRADE CHOLANGIOPANCREATOGRAPHY (ERCP);  Surgeon: Irving Copas., MD;  Location: [facility];  Service: Gastroenterology;  Laterality: N/A;   IR EXCHANGE BILIARY DRAIN  09/07/2019   IR PERC CHOLECYSTOSTOMY  07/21/2019   LEFT HEART CATH AND CORONARY ANGIOGRAPHY N/A 10/29/2019   Procedure: LEFT HEART CATH AND CORONARY ANGIOGRAPHY;  Surgeon: Leonie Man, MD;  Location: [facility];  Service: Cardiovascular;  Laterality: N/A;   REMOVAL OF STONES  02/15/2020   Procedure:  REMOVAL OF STONES;  Surgeon: Rush Landmark Telford Nab., MD;  Location: [facility];  Service: Gastroenterology;;   Joan Mayans  02/15/2020   Procedure: Joan Mayans;  Surgeon: Mansouraty, Telford Nab., MD;  Location: [facility];  Service: Gastroenterology;;     Home Medications:  Prior to Admission medications   Medication Sig Start Date End Date Taking? Authorizing Provider  acetaminophen (TYLENOL) 325 MG tablet Take 650 mg by mouth every 8 (eight) hours as needed (pain, fever > 100.5).    [provider]  albuterol (PROVENTIL) (2.5 MG/3ML) 0.083% nebulizer solution Take 2.5 mg by nebulization every 6 (six) hours as needed for wheezing or shortness of breath.    [provider]  albuterol (VENTOLIN HFA) 108 (90 Base) MCG/ACT inhaler Inhale 2 puffs into the lungs every 6 (six) hours as needed for wheezing or shortness of breath.  07/10/19   [provider]  amantadine (SYMMETREL) 100 MG capsule Take 100 mg by mouth in the morning.    [provider]  ascorbic acid (VITAMIN C) 500 MG tablet Take 500 mg by mouth in the morning.    [provider]  aspirin 325 MG EC tablet Take 325 mg by mouth in the morning.    [provider]  azelastine (ASTELIN) 0.1 % nasal spray Place 2 sprays into both nostrils 2 (two) times daily.    [provider]  AZO-CRANBERRY PO Take 2 each by mouth in the morning.    [provider]  baclofen (LIORESAL) 10 MG tablet Take 10 mg by mouth 3 (three) times daily. Hold if lethargic or confused    [provider]  bisacodyl (DULCOLAX) 10 MG suppository Place 10 mg rectally daily as needed for moderate constipation. If no results for MOM    [provider]  buPROPion (WELLBUTRIN XL) 150 MG 24 hr tablet Take 150 mg by mouth in the morning.    [provider]  cefTRIAXone (ROCEPHIN) 1 g injection Inject 1 g into the muscle once. Patient not taking: No sig reported    [provider]  cephALEXin (KEFLEX) 250 MG capsule Take 250 mg by mouth 3 (three) times daily. For 5 days Patient not taking: No sig reported    [provider]  docusate sodium (COLACE) 100 MG capsule Take 100 mg by mouth 2 (two) times daily.    [provider]  doxycycline (VIBRAMYCIN) 100 MG capsule Take 1 capsule (100 mg total) by mouth 2 (two) times daily. 05/01/22   Hayden Rasmussen, MD  DULoxetine (CYMBALTA) 20 MG capsule Take 60 mg by mouth in the morning.    [provider]  Ensure (ENSURE) Take 237 mLs by mouth 3 (three) times daily.    [provider]  ferrous sulfate 325 (65 FE) MG tablet Take 325 mg by mouth 2 (two) times daily with a meal.  02/20/18   [provider]  fluticasone (FLONASE) 50 MCG/ACT nasal spray Place 1 spray into both nostrils at bedtime.    [provider]  furosemide (LASIX)  20 MG tablet Take 20 mg by mouth in the morning.    [provider]  gabapentin (NEURONTIN) 800 MG tablet Take 800 mg by mouth 3 (three) times daily.    [provider]  insulin aspart (NOVOLOG FLEXPEN) 100 UNIT/ML FlexPen 0-15 Units, Subcutaneous, 3 times daily with meals CBG < 70: implement hypoglycemia protocol-call MD CBG 70 - 120: 0 units CBG 121 - 150: 2 units CBG 151 - 200: 3 units CBG 201 - 250: 5 units CBG 251 - 300: 8 units CBG 301 - 350: 11 units CBG 351 - 400: 15 units CBG > 400: Patient taking differently: Inject 0-15 Units into the skin 3 (three) times daily with meals. 0-15 Units, Subcutaneous, 3 times daily with meals per sliding scale : CBG < 70: implement hypoglycemia protocol-call MD CBG 0 - 120 : 0 units CBG 121 - 150: 2 units CBG 151 - 200: 3 units CBG 201 - 250: 5 units CBG 251 - 300: 8 units CBG 301 - 350: 11 units CBG 351 - 400: 15 units CBG >400, CALL MD 05/18/19   Jonetta Osgood, MD  LANTUS SOLOSTAR 100 UNIT/ML Solostar Pen Inject 5 Units into the skin daily. 04/23/20   [provider]  latanoprost (XALATAN) 0.005 % ophthalmic solution Place 1 drop into both eyes at bedtime.    [provider]  linaclotide (LINZESS) 290 MCG CAPS capsule Take 290 mcg by mouth daily before breakfast.    [provider]  Melatonin 5 MG TABS Take 10 mg by mouth at bedtime.  11/14/17   [provider]  metoprolol succinate (TOPROL-XL) 100 MG 24 hr tablet Take 150 mg by mouth in the morning.    [provider]  Multiple Vitamins-Minerals (MULTIVITAMIN ADULT PO) Take 1 tablet by mouth in the morning.    [provider]  NONFORMULARY OR COMPOUNDED ITEM Apply 1 application  topically 3 (three) times daily. A&D/Balmex/Lidocaine 1:1:1. Apply to vulva 3 times daily.    [provider]  nystatin (MYCOSTATIN/NYSTOP) powder Apply 1 Application topically 2 (two) times daily. To vagina, armpits    [provider]  ondansetron (ZOFRAN) 4 MG tablet Take 4 mg by mouth every 6 (six) hours as needed for nausea or vomiting. 11/01/17   [provider]  oxyCODONE-acetaminophen (PERCOCET/ROXICET) 5-325 MG tablet Take 1 tablet by mouth every 6 (six) hours as needed for severe pain. 05/14/21   Blanchie Dessert, MD  polyethylene glycol (MIRALAX MIX-IN PAX) 17 g packet Take 17 g by mouth daily. Patient taking differently: Take 17 g by mouth in the morning. 05/21/20   Donne Hazel, MD  Potassium Chloride ER 20 MEQ TBCR Take 20 mEq by mouth in the morning. 05/17/20   [provider]  Probiotic Product (PROBIOTIC PO) Take 1 capsule by mouth 2 (two) times daily.    [provider]  senna-docusate (SENOKOT-S) 8.6-50 MG tablet Take 2 tablets by mouth 2 (two) times daily.    [provider]  simethicone (MYLICON) 683 MG chewable tablet Chew 125 mg by mouth every 6 (six) hours as needed for flatulence.    [provider]  simvastatin (ZOCOR) 10 MG tablet Take 10 mg by mouth at bedtime.    [provider]   spironolactone (ALDACTONE) 25 MG tablet Take 25 mg by mouth in the morning. 05/02/20   [provider]  traMADol (ULTRAM) 50 MG tablet Take 100 mg by mouth 2 (two) times daily as needed (pain).  [provider]    Inpatient Medications: Scheduled Meds:  amantadine  100 mg Oral Daily   aspirin EC  325 mg Oral Daily   baclofen  10 mg Oral TID   buPROPion  150 mg Oral Daily   docusate sodium  100 mg Oral BID   DULoxetine  60 mg Oral Daily   ferrous sulfate  325 mg Oral BID WC   fluticasone  1 spray Each Nare QHS   furosemide  40 mg Intravenous BID   gabapentin  800 mg Oral TID   insulin aspart  0-9 Units Subcutaneous TID WC   latanoprost  1 drop Both Eyes QHS   linaclotide  290 mcg Oral QAC breakfast   losartan  25 mg Oral Daily   metoprolol succinate  75 mg Oral Daily   potassium chloride SA  20 mEq Oral Daily   senna-docusate  2 tablet Oral BID   simvastatin  10 mg Oral QHS   spironolactone  25 mg Oral Daily   Continuous Infusions:  PRN Meds: acetaminophen **OR** acetaminophen, albuterol, melatonin, polyethylene glycol, traMADol  Allergies:    Allergies  Allergen Reactions   Sulfonamide Derivatives Hives and Shortness Of Breath   Penicillins Hives and Itching    Prior course of rocephin charted 05/2015 Also note has tolerated Zosyn in past 2016 and 2017    Social History:   Social History   Socioeconomic History   Marital status: Single    Spouse name: Not on file   Number of children: Not on file   Years of education: Not on file   Highest education level: Not on file  Occupational History   Not on file  Tobacco Use   Smoking status: Former    Packs/day: 0.50    Years: 5.00    Total pack years: 2.50    Types: Cigarettes   Smokeless tobacco: Never  Vaping Use   Vaping Use: Never used  Substance and Sexual Activity   Alcohol use: No   Drug use: No   Sexual activity: Not Currently  Other Topics Concern   Not on file  Social History  Narrative   Not on file   Social Determinants of Health   Financial Resource Strain: Low Risk  (02/13/2018)   Overall Financial Resource Strain (CARDIA)    Difficulty of Paying Living Expenses: Not hard at all  Food Insecurity: No Food Insecurity (02/13/2018)   Hunger Vital Sign    Worried About Running Out of Food in the Last Year: Never true    Ran Out of Food in the Last Year: Never true  Transportation Needs: No Transportation Needs (02/13/2018)   PRAPARE - Hydrologist (Medical): No    Lack of Transportation (Non-Medical): No  Physical Activity: Inactive (02/13/2018)   Exercise Vital Sign    Days of Exercise per Week: 0 days    Minutes of Exercise per Session: 0 min  Stress: No Stress Concern Present (02/13/2018)   [place]    Feeling of Stress : Only a little  Social Connections: Moderately Isolated (02/13/2018)   Social Connection and Isolation Panel [NHANES]    Frequency of Communication with Friends and Family: More than three times a week    Frequency of Social Gatherings with Friends and Family: Twice a week    Attends Religious Services: Never    Marine scientist or Organizations: No    Attends Club or  Organization Meetings: Never    Marital Status: Never married  Intimate Partner Violence: Not At Risk (02/13/2018)   Humiliation, Afraid, Rape, and Kick questionnaire    Fear of Current or Ex-Partner: No    Emotionally Abused: No    Physically Abused: No    Sexually Abused: No    Family History:    Family History  Problem Relation Age of Onset   Hypertension Mother    Cancer Mother        breast    Cancer Father        prostate   Multiple sclerosis Sister    Heart attack Neg Hx      ROS:  Please see the history of present illness.  +cough No melena, hematochezia All other ROS reviewed and negative.     Physical Exam/Data:   Vitals:   07/02/22 0630  07/02/22 0700 07/02/22 0730 07/02/22 0739  BP: 132/72 132/64 133/71   Pulse: 75 74 79   Resp: (!) 21 (!) 21 (!) 23   Temp:    97.7 F (36.5 C)  TempSrc:    Oral  SpO2: 100% 100% 100%   Weight:      Height:        Intake/Output Summary (Last 24 hours) at 07/02/2022 0921 Last data filed at 07/02/2022 0031 Gross per 24 hour  Intake 240 ml  Output 1400 ml  Net -1160 ml      07/01/2022    4:18 PM 05/06/2021    7:38 PM 03/19/2021    2:00 PM  Last 3 Weights  Weight (lbs) 220 lb 190 lb 185 lb  Weight (kg) 99.791 kg 86.183 kg 83.915 kg     Body mass index is 36.61 kg/m.  General:  chronically ill appearing, in no acute distress  HEENT: normal Neck: it is difficult to appreciate JVD Vascular: radial pulse regular; no pulsus alternans noted Cardiac:  regular rate and rhythm, no obvious murmur Lungs: bilateral rales anteriorly Abd: soft Ext: 1+ bilateral pitting/non-pitting edema Musculoskeletal:  No deformities  Skin: warm and dry  Neuro:  lower extremity weakness noted (due to multiple sclerosis) Psych:  Normal affect   EKG:  The EKG was personally reviewed and demonstrates:  see above Telemetry:  Telemetry was personally reviewed and demonstrates:  normal sinus rhythm     Laboratory Data:  High Sensitivity Troponin:   Recent Labs  Lab 07/01/22 1616 07/01/22 1817 07/02/22 0500  TROPONINIHS 18* 19* 20*     Chemistry Recent Labs  Lab 07/01/22 1616 07/01/22 1817 07/01/22 1926 07/02/22 0500  NA 145  --  147* 144  K 4.1  --  4.0 3.5  CL 108  --   --  108  CO2 32  --   --  30  GLUCOSE 110*  --   --  122*  BUN 10  --   --  10  CREATININE 0.87  --   --  0.80  CALCIUM 8.4*  --   --  8.4*  MG  --  1.8  --  1.7  GFRNONAA >60  --   --  >60  ANIONGAP 5  --   --  6    Recent Labs  Lab 07/01/22 1616 07/02/22 0500  PROT 5.7* 5.9*  ALBUMIN 2.8* 2.9*  AST 17 16  ALT 17 17  ALKPHOS 126 128*  BILITOT 0.5 0.4   Lipids No results for input(s): "CHOL", "TRIG", "HDL",  "LABVLDL", "LDLCALC", "CHOLHDL" in the last 168 hours.  Hematology Recent Labs  Lab 07/01/22 1616 07/01/22 1926 07/02/22 0500  WBC 6.7  --  6.8  RBC 3.76*  --  3.87  HGB 10.8* 11.2* 10.9*  HCT 33.0* 33.0* 33.8*  MCV 87.8  --  87.3  MCH 28.7  --  28.2  MCHC 32.7  --  32.2  RDW 16.3*  --  16.2*  PLT 288  --  269   Thyroid No results for input(s): "TSH", "FREET4" in the last 168 hours.  BNP Recent Labs  Lab 07/01/22 1617  BNP 3,102.7*    DDimer No results for input(s): "DDIMER" in the last 168 hours.   Radiology/Studies:  DG Chest Portable 1 View  Result Date: 07/01/2022 CLINICAL DATA:  Shortness of breath EXAM: PORTABLE CHEST 1 VIEW COMPARISON:  05/01/2022 FINDINGS: Cardiomegaly with vascular congestion, mild edema and probable pleural effusions. Basilar consolidations. No pneumothorax IMPRESSION: Cardiomegaly with vascular congestion, mild edema and probable pleural effusions. Basilar consolidations may be due to atelectasis or pneumonia. Electronically Signed   By: Donavan Foil M.D.   On: 07/01/2022 16:59     Assessment and Plan:   1. Acute on chronic (HFrEF) heart failure with reduced ejection fraction  EF in 2021 was 35-40. Cath in 2021 showed normal coronary arteries. She has a Non-ischemic cardiomyopathy. She is currently on Toprol 150, Spironolactone 25, Lasix 20 and K+ 20 at home. She is fairly sedentary due to her Shannon and she has an indwelling catheter. I do not think she is a good candidate for SGLT2 inhibitors. She is not on an ACE/ARB/ARNI. She did express concerns about potential side effects to this in the past. Reviewed her chart. She was seen by Dr. Haroldine Laws in 2014 and notes indicate she refused Lisinopril at that time.  - Agree with Lasix 40 IV twice daily - Echocardiogram is pending - Add Losartan 25 mg once daily  - Continue Toprol (dose reduced to 1/2 due to a/c CHF) >> adjust back to 150 prior to DC - Spironolactone held >> will resume Spironolactone 25  mg once daily  - Will follow   2. Elevated Troponin Minimal elevation of hsTrop without delta. She has a hx of normal cors on cath in 2021. Elevated Troponin c/w demand ischemia. No further workup is needed.  3. Hypertension  BP controlled.  4. Aortic atherosclerosis Continue ASA, statin. Unless there is a reason for her to be on ASA 325 mg, ok to change to ASA 81 mg once daily.     Risk Assessment/Risk Scores:        New York Heart Association (NYHA) Functional Class NYHA Class IV    For questions or updates, please contact [place] Please consult [email or web address] for contact info under    Signed, Richardson Dopp, PA-C  07/02/2022 9:21 AM   Patient seen and examined.  Agree with above documentation.  Shannon Garrett is a 62 year old female with a history of heart failure with reduced ejection fraction due to nonischemic cardiomyopathy, hypertension, diabetes, SVT, multiple sclerosis, anemia, left bundle branch block who we are consulted by Dr Velia Meyer for evaluation of heart failure.  Most recent echocardiogram was in November 2021 and showed EF 35 to 40%.  Cardiac catheterization 10/2019 showed normal coronary arteries.  She has not been following with cardiology, last seen 10/2020.  Patient reports she has gained over 20 pounds over the last few months.  She is wheelchair-bound due to her Shannon.  She reports has been having worsening dyspnea.  On presentation  to the ED, initial vital signs notable for BP 97/72 (most recent BP 133/71), pulse 72, SpO2 100% on 3 L.  Labs notable for creatinine 0.87, potassium 4.1, sodium 145, albumin 2.8, BNP 3100, troponin 18 > 19, procalcitonin negative, hemoglobin 10.8.  Chest x-ray shows cardiomegaly with vascular congestion, mild edema, and probable pleural effusions.  EKG shows sinus rhythm, rate 77, left bundle branch block.  Telemetry shows normal sinus rhythm with rate 70s to 80s.  Echocardiogram shows EF 20 to 25%, global hypokinesis, severe LV  dilatation, grossly normal RV systolic function, severely elevated RVSP (77 mmHg), moderate to severe mitral regurgitation (functional), moderate tricuspid regurgitation.  On exam, patient is alert and oriented, regular rate and rhythm, 2/6 systolic murmur, rhonchi, 1+ edema, + JVD.  Presentation consistent with decompensated heart failure.  Echocardiogram shows EF is severely reduced.  No evidence of cardiogenic shock at this time, she is normotensive and mentating appropriately, normal renal function and had 1.4L out with IV Lasix overnight.  Check lactate.  Continue diuresis with IV Lasix 40 mg twice daily.  Will add losartan 25 mg daily.  Continue spironolactone, will reduce home toprol dose.  Likely not a good SGLT2 inhibitor candidate given indwelling catheter and history of UTIs.    Donato Heinz, MD

## 2022-07-02 NOTE — Progress Notes (Signed)
VASCULAR LAB    Bilateral lower extremity venous duplex has been performed.  See CV proc for preliminary results.   Joyceann Kruser, RVT 07/02/2022, 10:35 AM

## 2022-07-02 NOTE — Progress Notes (Addendum)
PROGRESS NOTE    Shannon Garrett  PXT:[id number] DOB: 03/27/1961 DOA: 07/01/2022 PCP: Pcp, No  Chief Complaint  Patient presents with   Shortness of Breath   Fatigue    Brief Narrative:  Shannon Garrett is Shannon Garrett 62 y.o. female with medical history significant for chronic systolic heart failure, MS, essential hypertension, anemia of chronic disease with baseline hemoglobin 11-12, type 2 diabetes mellitus, who is admitted to [facility] on 07/01/2022 with acute on chronic systolic heart failure after presenting from sn[facility] to [facility] ED complaining of shortness of breath.    She's been admitted with HF exacerbation.  Assessment & Plan:   Principal Problem:   Acute on chronic systolic heart failure (HCC) Active Problems:   MS (multiple sclerosis) (HCC)   Depression   Essential hypertension   Acute hypoxic respiratory failure (HCC)   Allergic rhinitis   Elevated troponin   Anemia of chronic disease   DM2 (diabetes mellitus, type 2) (HCC)  Acute hypoxic respiratory failure  Due to HF exacerbation below Currently requiring 5 L [place] She's coughing up Axil Copeman lot of phlegm, would consider CT chest Wean as tolerated  Acute on chronic systolic heart failure  Ef 35-40% in 2021 CXR with vascular congestion, mild edema, pleural effusions Continue lasix 40 mg BID Metoprolol at 75 mg daily (lower than home dose) and spironolactone.  She's been started on losartan. Repeat echo pending Appreciate cardiology recommendations Follow [place] of edematous LE and LUE   Acute Metabolic Encephalopathy This was likely related to her respiratory failure Seems resolved at this time I'll need to discuss with caregivers for collateral as her description isn't particularly helpful  MS She notes she can't feed herself since she was unresponsive I think she'd benefit from MRI brain Will let her diurese as I don't think she'd currently tolerate an MRI Amantadine, baclofen  Hypertension Metoprolol, spironolactone,  losartan Baclofen   Type 2 Diabetes SSI A1c pending gabapentin  Anemia Trend  W/u as indicated  Depression  Anxiety Wellbutrin, cymbalta  Chronic Pain Cymbalta, gabapentin, tramadol     DVT prophylaxis: SCD Code Status: DNR Family Communication: none Disposition:   Status is: Inpatient Remains inpatient appropriate because: continued need for diuresis, additional w/u   Consultants:  cardiology  Procedures:  none  Antimicrobials:  Anti-infectives (From admission, onward)    None       Subjective: No complaints  Objective: Vitals:   07/02/22 0630 07/02/22 0700 07/02/22 0730 07/02/22 0739  BP: 132/72 132/64 133/71   Pulse: 75 74 79   Resp: (!) 21 (!) 21 (!) 23   Temp:    97.7 F (36.5 C)  TempSrc:    Oral  SpO2: 100% 100% 100%   Weight:      Height:        Intake/Output Summary (Last 24 hours) at 07/02/2022 0931 Last data filed at 07/02/2022 0031 Gross per 24 hour  Intake 240 ml  Output 1400 ml  Net -1160 ml   Filed Weights   07/01/22 1618  Weight: 99.8 kg    Examination:  General: No acute distress. Cardiovascular: RRR Lungs: anteriorly with diffuse coarse breath sounds, increased WOB on 5 L [place] Abdomen: Soft, nontender, nondistended Neurological: Alert and oriented 3. Moves all extremities 4. Cranial nerves II through XII grossly intact. Extremities: bilateral LE edema, LUE edema   Data Reviewed: I have personally reviewed following labs and imaging studies  CBC: Recent Labs  Lab 07/01/22 1616 07/01/22 1926 07/02/22 0500  WBC 6.7  --  6.8  NEUTROABS 3.9  --  3.5  HGB 10.8* 11.2* 10.9*  HCT 33.0* 33.0* 33.8*  MCV 87.8  --  87.3  PLT 288  --  314    Basic Metabolic Panel: Recent Labs  Lab 07/01/22 1616 07/01/22 1817 07/01/22 1926 07/02/22 0500  NA 145  --  147* 144  K 4.1  --  4.0 3.5  CL 108  --   --  108  CO2 32  --   --  30  GLUCOSE 110*  --   --  122*  BUN 10  --   --  10  CREATININE 0.87  --   --  0.80   CALCIUM 8.4*  --   --  8.4*  MG  --  1.8  --  1.7  PHOS  --   --   --  3.5    GFR: Estimated Creatinine Clearance: 88.6 mL/min (by C-G formula based on SCr of 0.8 mg/dL).  Liver Function Tests: Recent Labs  Lab 07/01/22 1616 07/02/22 0500  AST 17 16  ALT 17 17  ALKPHOS 126 128*  BILITOT 0.5 0.4  PROT 5.7* 5.9*  ALBUMIN 2.8* 2.9*    CBG: Recent Labs  Lab 07/01/22 1604 07/02/22 0536 07/02/22 0730  GLUCAP 121* 167* 89     No results found for this or any previous visit (from the past 240 hour(s)).       Radiology Studies: DG Chest Portable 1 View  Result Date: 07/01/2022 CLINICAL DATA:  Shortness of breath EXAM: PORTABLE CHEST 1 VIEW COMPARISON:  05/01/2022 FINDINGS: Cardiomegaly with vascular congestion, mild edema and probable pleural effusions. Basilar consolidations. No pneumothorax IMPRESSION: Cardiomegaly with vascular congestion, mild edema and probable pleural effusions. Basilar consolidations may be due to atelectasis or pneumonia. Electronically Signed   By: Donavan Foil M.D.   On: 07/01/2022 16:59        Scheduled Meds:  amantadine  100 mg Oral Daily   aspirin EC  325 mg Oral Daily   baclofen  10 mg Oral TID   buPROPion  150 mg Oral Daily   docusate sodium  100 mg Oral BID   DULoxetine  60 mg Oral Daily   ferrous sulfate  325 mg Oral BID WC   fluticasone  1 spray Each Nare QHS   furosemide  40 mg Intravenous BID   gabapentin  800 mg Oral TID   insulin aspart  0-9 Units Subcutaneous TID WC   latanoprost  1 drop Both Eyes QHS   linaclotide  290 mcg Oral QAC breakfast   losartan  25 mg Oral Daily   metoprolol succinate  75 mg Oral Daily   potassium chloride SA  20 mEq Oral Daily   senna-docusate  2 tablet Oral BID   simvastatin  10 mg Oral QHS   spironolactone  25 mg Oral Daily   Continuous Infusions:   LOS: 1 day    Time spent: over 30 min    Fayrene Helper, MD [facility]   To contact the attending provider between  7A-7P or the covering provider during after hours 7P-7A, please log into the web site [email or web address] and access using universal [place] password for that web site. If you do not have the password, please call the hospital operator.  07/02/2022, 9:31 AM

## 2022-07-03 DIAGNOSIS — I5043 Acute on chronic combined systolic (congestive) and diastolic (congestive) heart failure: Secondary | ICD-10-CM | POA: Diagnosis not present

## 2022-07-03 LAB — HEPATIC FUNCTION PANEL
ALT: 15 U/L (ref 0–44)
AST: 16 U/L (ref 15–41)
Albumin: 2.9 g/dL — ABNORMAL LOW (ref 3.5–5.0)
Alkaline Phosphatase: 137 U/L — ABNORMAL HIGH (ref 38–126)
Bilirubin, Direct: 0.1 mg/dL (ref 0.0–0.2)
Indirect Bilirubin: 0.3 mg/dL (ref 0.3–0.9)
Total Bilirubin: 0.4 mg/dL (ref 0.3–1.2)
Total Protein: 5.8 g/dL — ABNORMAL LOW (ref 6.5–8.1)

## 2022-07-03 LAB — URINALYSIS, ROUTINE W REFLEX MICROSCOPIC
Bilirubin Urine: NEGATIVE
Glucose, UA: NEGATIVE mg/dL
Ketones, ur: NEGATIVE mg/dL
Nitrite: NEGATIVE
Protein, ur: NEGATIVE mg/dL
RBC / HPF: >50 RBC/hpf — ABNORMAL HIGH (ref 0–5)
Specific Gravity, Urine: 1.009 (ref 1.005–1.030)
WBC, UA: >50 WBC/hpf — ABNORMAL HIGH (ref 0–5)
pH: 7 (ref 5.0–8.0)

## 2022-07-03 LAB — GLUCOSE, CAPILLARY
Glucose-Capillary: 91 mg/dL (ref 70–99)
Glucose-Capillary: 113 mg/dL — ABNORMAL HIGH (ref 70–99)
Glucose-Capillary: 87 mg/dL (ref 70–99)
Glucose-Capillary: 190 mg/dL — ABNORMAL HIGH (ref 70–99)

## 2022-07-03 LAB — BASIC METABOLIC PANEL
Anion gap: 11 (ref 5–15)
BUN: 10 mg/dL (ref 6–20)
CO2: 32 mmol/L (ref 22–32)
Calcium: 8.5 mg/dL — ABNORMAL LOW (ref 8.9–10.3)
Chloride: 101 mmol/L (ref 98–111)
Creatinine, Ser: 0.92 mg/dL (ref 0.44–1.00)
GFR, Estimated: >60 mL/min (ref >60)
Glucose, Bld: 114 mg/dL — ABNORMAL HIGH (ref 70–99)
Potassium: 3.4 mmol/L — ABNORMAL LOW (ref 3.5–5.1)
Sodium: 144 mmol/L (ref 135–145)

## 2022-07-03 LAB — CBC
HCT: 33.1 % — ABNORMAL LOW (ref 36.0–46.0)
Hemoglobin: 10.9 g/dL — ABNORMAL LOW (ref 12.0–15.0)
MCH: 28.1 pg (ref 26.0–34.0)
MCHC: 32.9 g/dL (ref 30.0–36.0)
MCV: 85.3 fL (ref 80.0–100.0)
Platelets: 249 10*3/uL (ref 150–400)
RBC: 3.88 MIL/uL (ref 3.87–5.11)
RDW: 16.3 % — ABNORMAL HIGH (ref 11.5–15.5)
WBC: 5.6 10*3/uL (ref 4.0–10.5)
nRBC: 0 % (ref 0.0–0.2)

## 2022-07-03 LAB — HEMOGLOBIN A1C
Hgb A1c MFr Bld: 5.8 % — ABNORMAL HIGH (ref 4.8–5.6)
Mean Plasma Glucose: 120 mg/dL

## 2022-07-03 LAB — PHOSPHORUS: Phosphorus: 3 mg/dL (ref 2.5–4.6)

## 2022-07-03 LAB — MAGNESIUM: Magnesium: 1.7 mg/dL (ref 1.7–2.4)

## 2022-07-03 MED ORDER — POTASSIUM CHLORIDE CRYS ER 20 MEQ PO TBCR: 40.0000 meq | EXTENDED_RELEASE_TABLET | ORAL | Status: DC

## 2022-07-03 MED ORDER — DM-GUAIFENESIN ER 30-600 MG PO TB12
1.0000 | ORAL_TABLET | Freq: Two times a day (BID) | ORAL | Status: DC
  Administered 2022-07-03 – 2022-07-06 (×7): 1 via ORAL
  Filled 2022-07-03 (×7): qty 1

## 2022-07-03 MED ORDER — LIDOCAINE HCL URETHRAL/MUCOSAL 2 % EX GEL
1.0000 | Freq: Once | CUTANEOUS | Status: AC
  Administered 2022-07-03: 1
  Filled 2022-07-03: qty 6

## 2022-07-03 MED ORDER — LOSARTAN POTASSIUM 25 MG PO TABS
12.5000 mg | ORAL_TABLET | Freq: Every day | ORAL | Status: DC
  Administered 2022-07-04 – 2022-07-06 (×3): 12.5 mg via ORAL
  Filled 2022-07-03 (×3): qty 1

## 2022-07-03 MED ORDER — POTASSIUM CHLORIDE CRYS ER 20 MEQ PO TBCR
40.0000 meq | EXTENDED_RELEASE_TABLET | Freq: Once | ORAL | Status: AC
  Administered 2022-07-03: 40 meq via ORAL
  Filled 2022-07-03: qty 2

## 2022-07-03 MED ORDER — MAGNESIUM OXIDE -MG SUPPLEMENT 400 (240 MG) MG PO TABS
400.0000 mg | ORAL_TABLET | Freq: Every day | ORAL | Status: DC
  Administered 2022-07-03 – 2022-07-06 (×4): 400 mg via ORAL
  Filled 2022-07-03 (×4): qty 1

## 2022-07-03 NOTE — Progress Notes (Addendum)
Rounding Note    Patient Name: Shannon Garrett Date of Encounter: 07/03/2022  [place] Cardiologist: Peter Martinique, MD   Subjective   Patient reports improvement in breathing today. She denies chest pain or palpitations. Primary concern for her today is urinary pain. She cites frequent UTIs and wonders if she has one.  Inpatient Medications    Scheduled Meds:  amantadine  100 mg Oral Daily   aspirin EC  325 mg Oral Daily   baclofen  10 mg Oral TID   buPROPion  150 mg Oral Daily   docusate sodium  100 mg Oral BID   DULoxetine  60 mg Oral Daily   ferrous sulfate  325 mg Oral BID WC   fluticasone  1 spray Each Nare QHS   furosemide  40 mg Intravenous BID   gabapentin  800 mg Oral TID   insulin aspart  0-9 Units Subcutaneous TID WC   latanoprost  1 drop Both Eyes QHS   linaclotide  290 mcg Oral QAC breakfast   losartan  25 mg Oral Daily   magnesium oxide  400 mg Oral Daily   metoprolol succinate  75 mg Oral Daily   potassium chloride SA  20 mEq Oral Daily   senna-docusate  2 tablet Oral BID   simvastatin  10 mg Oral QHS   spironolactone  25 mg Oral Daily   Continuous Infusions:  PRN Meds: acetaminophen **OR** acetaminophen, albuterol, melatonin, polyethylene glycol, traMADol   Vital Signs    Vitals:   07/02/22 1445 07/02/22 1600 07/02/22 1935 07/03/22 0428  BP: (!) 123/58 (!) 106/42 (!) 96/46 93/67  Pulse: 80 79 75 75  Resp: (!) '23 20 19 18  '$ Temp:   98.2 F (36.8 C) 98.4 F (36.9 C)  TempSrc:  Oral Oral Oral  SpO2: 100% 100% 97% 100%  Weight:    102 kg  Height:        Intake/Output Summary (Last 24 hours) at 07/03/2022 0910 Last data filed at 07/03/2022 0840 Gross per 24 hour  Intake 600 ml  Output 3925 ml  Net -3325 ml      07/03/2022    4:28 AM 07/01/2022    4:18 PM 05/06/2021    7:38 PM  Last 3 Weights  Weight (lbs) 224 lb 13.9 oz 220 lb 190 lb  Weight (kg) 102 kg 99.791 kg 86.183 kg      Telemetry    No telemetry available - Personally  Reviewed  ECG    No new tracing  Physical Exam   GEN: No acute distress.  Chronically ill appearing. Neck: No JVD Cardiac: RRR, no murmurs, rubs, or gallops.  Respiratory: Bibasilar rhonchi. Upper airway secretions noted. GI: Soft, nontender, non-distended  MS: 2+ bilateral lower extremity edema. No deformity. Neuro:  Nonfocal  Psych: Normal affect   Labs    High Sensitivity Troponin:   Recent Labs  Lab 07/01/22 1616 07/01/22 1817 07/02/22 0500  TROPONINIHS 18* 19* 20*     Chemistry Recent Labs  Lab 07/01/22 1616 07/01/22 1817 07/01/22 1926 07/02/22 0500 07/03/22 0056  NA 145  --  147* 144 144  K 4.1  --  4.0 3.5 3.4*  CL 108  --   --  108 101  CO2 32  --   --  30 32  GLUCOSE 110*  --   --  122* 114*  BUN 10  --   --  10 10  CREATININE 0.87  --   --  0.80 0.92  CALCIUM 8.4*  --   --  8.4* 8.5*  MG  --  1.8  --  1.7 1.7  PROT 5.7*  --   --  5.9* 5.8*  ALBUMIN 2.8*  --   --  2.9* 2.9*  AST 17  --   --  16 16  ALT 17  --   --  17 15  ALKPHOS 126  --   --  128* 137*  BILITOT 0.5  --   --  0.4 0.4  GFRNONAA >60  --   --  >60 >60  ANIONGAP 5  --   --  6 11    Lipids No results for input(s): "CHOL", "TRIG", "HDL", "LABVLDL", "LDLCALC", "CHOLHDL" in the last 168 hours.  Hematology Recent Labs  Lab 07/01/22 1616 07/01/22 1926 07/02/22 0500 07/03/22 0056  WBC 6.7  --  6.8 5.6  RBC 3.76*  --  3.87 3.88  HGB 10.8* 11.2* 10.9* 10.9*  HCT 33.0* 33.0* 33.8* 33.1*  MCV 87.8  --  87.3 85.3  MCH 28.7  --  28.2 28.1  MCHC 32.7  --  32.2 32.9  RDW 16.3*  --  16.2* 16.3*  PLT 288  --  269 249   Thyroid No results for input(s): "TSH", "FREET4" in the last 168 hours.  BNP Recent Labs  Lab 07/01/22 1617  BNP 3,102.7*    DDimer No results for input(s): "DDIMER" in the last 168 hours.   Radiology    MR BRAIN WO CONTRAST  Result Date: 07/03/2022 CLINICAL DATA:  Multiple sclerosis.  Right upper extremity weakness. EXAM: MRI HEAD WITHOUT CONTRAST TECHNIQUE:  Multiplanar, multiecho pulse sequences of the brain and surrounding structures were obtained without intravenous contrast. COMPARISON:  Brain MRI 07/11/2015 FINDINGS: Brain: No acute infarction, hemorrhage, hydrocephalus, extra-axial collection or mass lesion. There are numerous white matter lesions in unchanged distribution since 07/11/2015. There are no new lesions. No chronic microhemorrhage. Unchanged brain volume. Vascular: Normal flow voids. Skull and upper cervical spine: Normal marrow signal. Sinuses/Orbits: Negative. Other: None. IMPRESSION: Unchanged distribution of numerous white matter lesions in a pattern consistent with multiple sclerosis. No new lesions. No acute abnormality. Electronically Signed   By: Ulyses Jarred M.D.   On: 07/03/2022 00:18   VAS [place] LOWER EXTREMITY VENOUS (DVT)  Result Date: 07/02/2022  Lower Venous DVT Study Patient Name:  KERRA GUILFOIL  Date of Exam:   07/02/2022 Medical Rec #: [id number]      Accession #:    [id number] Date of Birth: [birth year]      Patient Gender: F Patient Age:   62 years Exam Location:  [facility] Procedure:      VAS [place] LOWER EXTREMITY VENOUS (DVT) Referring Phys: A POWELL JR --------------------------------------------------------------------------------  Indications: Edema, and SOB.  Risk Factors: Heart failure, EF 20-25%. Limitations: Body habitus and MS, reduced EF, slow breathing. Comparison Study: Prior negative bilateral LEV done 10/14/13 Performing Technologist: Sharion Dove RVS  Examination Guidelines: A complete evaluation includes B-mode imaging, spectral Doppler, color Doppler, and power Doppler as needed of all accessible portions of each vessel. Bilateral testing is considered an integral part of a complete examination. Limited examinations for reoccurring indications may be performed as noted. The reflux portion of the exam is performed with the patient in reverse Trendelenburg.   +---------+---------------+---------+-----------+----------+-------------------+ RIGHT    CompressibilityPhasicitySpontaneityPropertiesThrombus Aging      +---------+---------------+---------+-----------+----------+-------------------+ CFV      Full                                                             +---------+---------------+---------+-----------+----------+-------------------+  SFJ      Full                                                             +---------+---------------+---------+-----------+----------+-------------------+ FV Prox  Full                                                             +---------+---------------+---------+-----------+----------+-------------------+ FV Mid   Full           Yes      Yes                                      +---------+---------------+---------+-----------+----------+-------------------+ FV DistalFull                                                             +---------+---------------+---------+-----------+----------+-------------------+ PFV      Full                                                             +---------+---------------+---------+-----------+----------+-------------------+ POP      Full           Yes      Yes                                      +---------+---------------+---------+-----------+----------+-------------------+ PTV                                                   Not well visualized +---------+---------------+---------+-----------+----------+-------------------+ PERO                                                  Not well visualized +---------+---------------+---------+-----------+----------+-------------------+   +---------+---------------+---------+-----------+----------+-------------------+ LEFT     CompressibilityPhasicitySpontaneityPropertiesThrombus Aging      +---------+---------------+---------+-----------+----------+-------------------+  CFV      Full                                                             +---------+---------------+---------+-----------+----------+-------------------+ SFJ      Full                                                             +---------+---------------+---------+-----------+----------+-------------------+  FV Prox  Full                                                             +---------+---------------+---------+-----------+----------+-------------------+ FV Mid   Full                                                             +---------+---------------+---------+-----------+----------+-------------------+ FV DistalFull                                                             +---------+---------------+---------+-----------+----------+-------------------+ POP      Full                                                             +---------+---------------+---------+-----------+----------+-------------------+ PTV                                                   Not well visualized +---------+---------------+---------+-----------+----------+-------------------+ PERO                                                  Not well visualized +---------+---------------+---------+-----------+----------+-------------------+     Summary: RIGHT: - There is no evidence of deep vein thrombosis in the lower extremity. However, portions of this examination were limited- see technologist comments above.  - No cystic structure found in the popliteal fossa.  LEFT: - There is no evidence of deep vein thrombosis in the lower extremity. However, portions of this examination were limited- see technologist comments above.  - No cystic structure found in the popliteal fossa.  *See table(s) above for measurements and observations.    Preliminary    VAS [place] UPPER EXTREMITY VENOUS DUPLEX  Result Date: 07/02/2022 UPPER VENOUS STUDY  Patient Name:  STAISHA WINIARSKI  Date of Exam:    07/02/2022 Medical Rec #: [id number]      Accession #:    [id number] Date of Birth: 08-Apr-1961      Patient Gender: F Patient Age:   24 years Exam Location:  [facility] Procedure:      VAS [place] UPPER EXTREMITY VENOUS DUPLEX Referring Phys: A POWELL JR --------------------------------------------------------------------------------  Indications: Edema Risk Factors: MS, Heart failure. Limitations: Body habitus, MS. Comparison Study: No prior study on file Performing Technologist: Sharion Dove RVS  Examination Guidelines: A complete evaluation includes B-mode imaging, spectral Doppler, color Doppler, and power Doppler as needed of all accessible portions of each vessel. Bilateral testing is considered an  integral part of a complete examination. Limited examinations for reoccurring indications may be performed as noted.  Left Findings: +----------+------------+---------+-----------+----------+---------------+ LEFT      CompressiblePhasicitySpontaneousProperties    Summary     +----------+------------+---------+-----------+----------+---------------+ IJV           Full       Yes       Yes                              +----------+------------+---------+-----------+----------+---------------+ Subclavian    Full                                                  +----------+------------+---------+-----------+----------+---------------+ Axillary                 Yes       Yes                              +----------+------------+---------+-----------+----------+---------------+ Brachial                                            patent by color +----------+------------+---------+-----------+----------+---------------+ Radial                                              Not visualized  +----------+------------+---------+-----------+----------+---------------+ Ulnar                                               Not visualized   +----------+------------+---------+-----------+----------+---------------+ Cephalic                                            patent by color +----------+------------+---------+-----------+----------+---------------+ Basilic                                             Not visualized  +----------+------------+---------+-----------+----------+---------------+  Summary:  Left: No obvious evidence of deep vein thrombosis or superficial vein thrombosis in the visualized veins of the upper extremity.  *See table(s) above for measurements and observations.    Preliminary    ECHOCARDIOGRAM COMPLETE  Result Date: 07/02/2022    ECHOCARDIOGRAM REPORT   Patient Name:   Charity Tessier Date of Exam: 07/02/2022 Medical Rec #:  [id number]     Height:       65.0 in Accession #:    [id number]    Weight:       220.0 lb Date of Birth:  1961/02/28     BSA:          2.060 m Patient Age:    43 years      BP:           133/71 mmHg Patient Gender: F  HR:           87 bpm. Exam Location:  [place] Procedure: 2D Echo, Cardiac Doppler, Color Doppler and Intracardiac            Opacification Agent Indications:    CHF-acute systolic  History:        Patient has prior history of Echocardiogram examinations, most                 recent 05/20/2020. CHF; Risk Factors:Diabetes, Hypertension,                 Dyslipidemia and Former Smoker. Multiple sclerosis. Acute                 hypoxic respiratory therapy. Elevated troponin. GERD.  Sonographer:    Clayton Lefort RDCS (AE) Referring Phys: Rhetta Mura  Sonographer Comments: Patient is obese. Image acquisition challenging due to patient body habitus. Limited mobility. IMPRESSIONS  1. Left ventricular ejection fraction, by estimation, is 20 to 25%. The left ventricle has severely decreased function. The left ventricle demonstrates global hypokinesis. The left ventricular internal cavity size was severely dilated. There is mild left ventricular hypertrophy. Left ventricular  diastolic parameters are indeterminate.  2. Right ventricular systolic function was not well visualized. The right ventricular size is not well visualized. Poor RV visualization but appears grossly normal size and systolic function. There is severely elevated pulmonary artery systolic pressure. The estimated right ventricular systolic pressure is 37.1 mmHg.  3. Left atrial size was moderately dilated.  4. The mitral valve is abnormal. Moderate to severe mitral valve regurgitation. Appears functional  5. Tricuspid valve regurgitation is moderate.  6. The aortic valve was not well visualized. Aortic valve regurgitation is trivial. No aortic stenosis is present.  7. The inferior vena cava is dilated in size with <50% respiratory variability, suggesting right atrial pressure of 15 mmHg. FINDINGS  Left Ventricle: Left ventricular ejection fraction, by estimation, is 20 to 25%. The left ventricle has severely decreased function. The left ventricle demonstrates global hypokinesis. The left ventricular internal cavity size was severely dilated. There is mild left ventricular hypertrophy. Left ventricular diastolic parameters are indeterminate. Right Ventricle: The right ventricular size is not well visualized. Right vetricular wall thickness was not well visualized. Right ventricular systolic function was not well visualized. There is severely elevated pulmonary artery systolic pressure. The tricuspid regurgitant velocity is 3.96 m/s, and with an assumed right atrial pressure of 15 mmHg, the estimated right ventricular systolic pressure is 06.2 mmHg. Left Atrium: Left atrial size was moderately dilated. Right Atrium: Right atrial size was normal in size. Pericardium: There is no evidence of pericardial effusion. Mitral Valve: The mitral valve is abnormal. Moderate to severe mitral valve regurgitation. Tricuspid Valve: The tricuspid valve is normal in structure. Tricuspid valve regurgitation is moderate. Aortic Valve: The  aortic valve was not well visualized. Aortic valve regurgitation is trivial. No aortic stenosis is present. Aortic valve mean gradient measures 4.0 mmHg. Aortic valve peak gradient measures 7.2 mmHg. Aortic valve area, by VTI measures 1.60 cm. Pulmonic Valve: The pulmonic valve was not well visualized. Pulmonic valve regurgitation is not visualized. Aorta: The aortic root is normal in size and structure. Venous: The inferior vena cava is dilated in size with less than 50% respiratory variability, suggesting right atrial pressure of 15 mmHg. IAS/Shunts: The interatrial septum was not well visualized.  LEFT VENTRICLE PLAX 2D LVIDd:         5.70 cm LVIDs:  5.00 cm LV PW:         1.30 cm LV IVS:        0.80 cm LVOT diam:     1.80 cm LV SV:         30 LV SV Index:   14 LVOT Area:     2.54 cm  RIGHT VENTRICLE             IVC RV Basal diam:  3.40 cm     IVC diam: 2.20 cm RV S prime:     11.10 cm/s TAPSE (M-mode): 1.9 cm LEFT ATRIUM             Index        RIGHT ATRIUM           Index LA diam:        4.70 cm 2.28 cm/m   RA Area:     17.10 cm LA Vol (A2C):   81.6 ml 39.61 ml/m  RA Volume:   50.00 ml  24.27 ml/m LA Vol (A4C):   83.4 ml 40.49 ml/m LA Biplane Vol: 85.1 ml 41.31 ml/m  AORTIC VALVE AV Area (Vmax):    1.64 cm AV Area (Vmean):   1.22 cm AV Area (VTI):     1.60 cm AV Vmax:           134.00 cm/s AV Vmean:          92.400 cm/s AV VTI:            0.185 m AV Peak Grad:      7.2 mmHg AV Mean Grad:      4.0 mmHg LVOT Vmax:         86.60 cm/s LVOT Vmean:        44.200 cm/s LVOT VTI:          0.116 m LVOT/AV VTI ratio: 0.63  AORTA Ao Root diam: 3.00 cm MR Peak grad:    89.9 mmHg    TRICUSPID VALVE MR Mean grad:    52.0 mmHg    TR Peak grad:   62.7 mmHg MR Vmax:         474.00 cm/s  TR Vmax:        396.00 cm/s MR Vmean:        333.0 cm/s MR PISA:         1.57 cm     SHUNTS MR PISA Eff ROA: 13 mm       Systemic VTI:  0.12 m MR PISA Radius:  0.50 cm      Systemic Diam: 1.80 cm Oswaldo Milian MD  Electronically signed by Oswaldo Milian MD Signature Date/Time: 07/02/2022/10:36:02 AM    Final    DG Chest Portable 1 View  Result Date: 07/01/2022 CLINICAL DATA:  Shortness of breath EXAM: PORTABLE CHEST 1 VIEW COMPARISON:  05/01/2022 FINDINGS: Cardiomegaly with vascular congestion, mild edema and probable pleural effusions. Basilar consolidations. No pneumothorax IMPRESSION: Cardiomegaly with vascular congestion, mild edema and probable pleural effusions. Basilar consolidations may be due to atelectasis or pneumonia. Electronically Signed   By: Donavan Foil M.D.   On: 07/01/2022 16:59    Cardiac Studies   07/02/22 TTE  IMPRESSIONS     1. Left ventricular ejection fraction, by estimation, is 20 to 25%. The  left ventricle has severely decreased function. The left ventricle  demonstrates global hypokinesis. The left ventricular internal cavity size  was severely dilated. There is mild  left ventricular hypertrophy. Left ventricular diastolic parameters  are  indeterminate.   2. Right ventricular systolic function was not well visualized. The right  ventricular size is not well visualized. Poor RV visualization but appears  grossly normal size and systolic function. There is severely elevated  pulmonary artery systolic  pressure. The estimated right ventricular systolic pressure is 52.8 mmHg.   3. Left atrial size was moderately dilated.   4. The mitral valve is abnormal. Moderate to severe mitral valve  regurgitation. Appears functional   5. Tricuspid valve regurgitation is moderate.   6. The aortic valve was not well visualized. Aortic valve regurgitation  is trivial. No aortic stenosis is present.   7. The inferior vena cava is dilated in size with <50% respiratory  variability, suggesting right atrial pressure of 15 mmHg.   FINDINGS   Left Ventricle: Left ventricular ejection fraction, by estimation, is 20  to 25%. The left ventricle has severely decreased function. The left   ventricle demonstrates global hypokinesis. The left ventricular internal  cavity size was severely dilated.  There is mild left ventricular hypertrophy. Left ventricular diastolic  parameters are indeterminate.   Right Ventricle: The right ventricular size is not well visualized. Right  vetricular wall thickness was not well visualized. Right ventricular  systolic function was not well visualized. There is severely elevated  pulmonary artery systolic pressure. The  tricuspid regurgitant velocity is 3.96 m/s, and with an assumed right  atrial pressure of 15 mmHg, the estimated right ventricular systolic  pressure is 41.3 mmHg.   Left Atrium: Left atrial size was moderately dilated.   Right Atrium: Right atrial size was normal in size.   Pericardium: There is no evidence of pericardial effusion.   Mitral Valve: The mitral valve is abnormal. Moderate to severe mitral  valve regurgitation.   Tricuspid Valve: The tricuspid valve is normal in structure. Tricuspid  valve regurgitation is moderate.   Aortic Valve: The aortic valve was not well visualized. Aortic valve  regurgitation is trivial. No aortic stenosis is present. Aortic valve mean  gradient measures 4.0 mmHg. Aortic valve peak gradient measures 7.2 mmHg.  Aortic valve area, by VTI measures  1.60 cm.   Pulmonic Valve: The pulmonic valve was not well visualized. Pulmonic valve  regurgitation is not visualized.   Aorta: The aortic root is normal in size and structure.   Venous: The inferior vena cava is dilated in size with less than 50%  respiratory variability, suggesting right atrial pressure of 15 mmHg.   IAS/Shunts: The interatrial septum was not well visualized.   Patient Profile   Jull Harral is a 62 y.o. female with a hx of (HFrEF) heart failure with reduced ejection fraction secondary to non-ischemic cardiomyopathy, hypertension, diabetes mellitus, SVT, multiple sclerosis, adrenal insufficiency, depression,  glaucoma, GERD, obesity, aortic atherosclerosis, anemia of chronic disease, intermittent left bundle branch block who is being seen 07/02/2022 for the evaluation of decompensated heart failure at the request of Dr Velia Meyer.   Assessment & Plan    Acute on Chronic heart failure with reduced ejection fraction (HFrEF) Non-ischemic cardiomyopathy  Patient admitted on 07/02/22 with symptoms of acute HF. Reports 20 lbs weight gain. LVEF 35-40% in 2021 and LHC with no CAD.  LVEF on repeat echo this admission shows decrease to 20-25%, global hypokinesis, severe LV dilatation, grossly normal RV systolic function, severely elevated RVSP (77 mmHg), moderate to severe mitral regurgitation (functional), moderate tricuspid regurgitation. BNP elevated to 3102.7. Lactate 1.3.  Patient now net negative 4.5 L following IV lasix '40mg'$  x3  and has improved respiratory status from admission. Continue IV diuresis today. Bibasilar rhonchi noted with 2+ bilateral lower extremity edema. Continue to closely monitor I/O and weight. Daily fluid intake should be limited to <1575m. Continue GDMT: Spironolactone '25mg'$  Losartan 12.'5mg'$  (decreased with hypotension) Toprol '75mg'$  (reduced in the setting of acute exacerbation)  Concern for dysphagia  Patient with longstanding MS has upper airway secretion sounds noted on lung physical exam. Would consider speech evaluation.   Hypertension  Patient's blood pressure well controlled with HF GDMT. Losartan added this admission, will cut back to 12.'5mg'$  tomorrow with BP in the high 924Msystolic.  Elevated troponin  Patient with troponin mildly elevated but flat (18, 19, 20) in the setting of acute CHF. Consistent with demand ischemia. No chest pain today.  Aortic atherosclerosis  Patient on ASA, statin. Appears that she has been taking '325mg'$  ASA daily, decrease to '81mg'$ .   Per primary team  MS DM Urinary pain  For questions or updates, please contact [facility] consult [email or web address] for contact info under        Signed, ELily Kocher PA-C  07/03/2022, 9:10 AM    Personally seen and examined. Agree with above.  62year old with complex medical history previously been seen by advanced heart failure team with ejection fraction of 20 to 25% nonischemic cardiomyopathy with multiple sclerosis.  Improved breathing.  Continues to diurese.  Some upper airway noise detected.  Question swallow eval given her multiple sclerosis.  Could she be aspirating.  Continue with IV furosemide 40 mg twice daily with low-dose losartan decreased to 12.5 daily.  She is also on spironolactone 25 and Toprol 75.  Hypotension noted.  Elevated troponin flat 18-20, compatible with chronic myocardial injury in the setting of acute heart failure.  We will continue to follow.  MCandee Furbish MD

## 2022-07-03 NOTE — Progress Notes (Signed)
PROGRESS NOTE    Shannon Garrett  OZD:[id number] DOB: 1960-07-26 DOA: 07/01/2022 PCP: Pcp, No  Chief Complaint  Patient presents with   Shortness of Breath   Fatigue    Brief Narrative:  Shannon Garrett is Shannon Garrett 62 y.o. female with medical history significant for chronic systolic heart failure, MS, essential hypertension, anemia of chronic disease with baseline hemoglobin 11-12, type 2 diabetes mellitus, who is admitted to [facility] on 07/01/2022 with acute on chronic systolic heart failure after presenting from snf to [facility] ED complaining of shortness of breath.    She's been admitted with HF exacerbation.  Cards is following.   Assessment & Plan:   Principal Problem:   Acute on chronic combined systolic and diastolic CHF (congestive heart failure) (HCC) Active Problems:   MS (multiple sclerosis) (HCC)   Depression   Essential hypertension   Acute hypoxic respiratory failure (HCC)   Allergic rhinitis   Elevated troponin   Anemia of chronic disease   DM2 (diabetes mellitus, type 2) (HCC)  Acute hypoxic respiratory failure  Due to HF exacerbation below O2 needs improved, currently on RA Will hold off on additional chest imaging at this time given improvement with lasix  Acute on chronic systolic heart failure  Ef 35-40% in 2021 CXR with vascular congestion, mild edema, pleural effusions Continue lasix 40 mg BID Metoprolol at 75 mg daily (lower than home dose) and spironolactone.  She's been started on losartan. Repeat echo with EF 20-25%, severely elevated PASP, IVC dilated with <50% resp variability Appreciate cardiology recommendations  Follow [place] of edematous LE and LUE -> negative for DVT  Dysuria Follow UA/culture (change suprapubic and take new culture off new foley) She has hx multidrug resistant infections  Acute Metabolic Encephalopathy This was likely related to her respiratory failure Seems resolved at this time (she told me she was "unresponsive", but on  discussing with family, this was actually more confusion)  MS She notes she can't feed herself since she was unresponsive MRI brain with unchanged white matter lesions Amantadine, baclofen  Hypertension Metoprolol, spironolactone, losartan Baclofen   Type 2 Diabetes SSI A1c 5.8 gabapentin  Anemia Trend  W/u as indicated  Depression  Anxiety Wellbutrin, cymbalta  Chronic Pain Cymbalta, gabapentin, tramadol     DVT prophylaxis: SCD Code Status: DNR Family Communication: none Disposition:   Status is: Inpatient Remains inpatient appropriate because: continued need for diuresis, additional w/u   Consultants:  cardiology  Procedures:  none  Antimicrobials:  Anti-infectives (From admission, onward)    None       Subjective: C/o dysuria  Objective: Vitals:   07/02/22 1600 07/02/22 1935 07/03/22 0428 07/03/22 1109  BP: (!) 106/42 (!) 96/46 93/67 (!) 114/49  Pulse: 79 75 75 72  Resp: '20 19 18 18  '$ Temp:  98.2 F (36.8 C) 98.4 F (36.9 C) 98.8 F (37.1 C)  TempSrc: Oral Oral Oral Oral  SpO2: 100% 97% 100% 96%  Weight:   102 kg   Height:        Intake/Output Summary (Last 24 hours) at 07/03/2022 1627 Last data filed at 07/03/2022 1419 Gross per 24 hour  Intake 1714 ml  Output 4250 ml  Net -2536 ml   Filed Weights   07/01/22 1618 07/03/22 0428  Weight: 99.8 kg 102 kg    Examination:  General: No acute distress. Cardiovascular: RRR Lungs: unlabored Abdomen: suprapubic catheter, no abdominal TTP Neurological: alert and oriented, moving RUE better today Extremities: continued LE edema and LUE  edema   Data Reviewed: I have personally reviewed following labs and imaging studies  CBC: Recent Labs  Lab 07/01/22 1616 07/01/22 1926 07/02/22 0500 07/03/22 0056  WBC 6.7  --  6.8 5.6  NEUTROABS 3.9  --  3.5  --   HGB 10.8* 11.2* 10.9* 10.9*  HCT 33.0* 33.0* 33.8* 33.1*  MCV 87.8  --  87.3 85.3  PLT 288  --  269 678    Basic Metabolic  Panel: Recent Labs  Lab 07/01/22 1616 07/01/22 1817 07/01/22 1926 07/02/22 0500 07/03/22 0056  NA 145  --  147* 144 144  K 4.1  --  4.0 3.5 3.4*  CL 108  --   --  108 101  CO2 32  --   --  30 32  GLUCOSE 110*  --   --  122* 114*  BUN 10  --   --  10 10  CREATININE 0.87  --   --  0.80 0.92  CALCIUM 8.4*  --   --  8.4* 8.5*  MG  --  1.8  --  1.7 1.7  PHOS  --   --   --  3.5 3.0    GFR: Estimated Creatinine Clearance: 78 mL/min (by C-G formula based on SCr of 0.92 mg/dL).  Liver Function Tests: Recent Labs  Lab 07/01/22 1616 07/02/22 0500 07/03/22 0056  AST '17 16 16  '$ ALT '17 17 15  '$ ALKPHOS 126 128* 137*  BILITOT 0.5 0.4 0.4  PROT 5.7* 5.9* 5.8*  ALBUMIN 2.8* 2.9* 2.9*    CBG: Recent Labs  Lab 07/02/22 1631 07/02/22 2100 07/03/22 0611 07/03/22 1106 07/03/22 1622  GLUCAP 130* 172* 91 113* 87     Recent Results (from the past 240 hour(s))  Resp panel by RT-PCR (RSV, Flu Laird Runnion&B, Covid) Anterior Nasal Swab     Status: None   Collection Time: 07/02/22  7:40 PM   Specimen: Anterior Nasal Swab  Result Value Ref Range Status   SARS Coronavirus 2 by RT PCR NEGATIVE NEGATIVE Final    Comment: (NOTE) SARS-CoV-2 target nucleic acids are NOT DETECTED.  The SARS-CoV-2 RNA is generally detectable in upper respiratory specimens during the acute phase of infection. The lowest concentration of SARS-CoV-2 viral copies this assay can detect is 138 copies/mL. Elverta Dimiceli negative result does not preclude SARS-Cov-2 infection and should not be used as the sole basis for treatment or other patient management decisions. Kortny Lirette negative result may occur with  improper specimen collection/handling, submission of specimen other than nasopharyngeal swab, presence of viral mutation(s) within the areas targeted by this assay, and inadequate number of viral copies(<138 copies/mL). Misael Mcgaha negative result must be combined with clinical observations, patient history, and epidemiological information. The  expected result is Negative.  Fact Sheet for Patients:  EntrepreneurPulse.com.au  Fact Sheet for Healthcare Providers:  IncredibleEmployment.be  This test is no t yet approved or cleared by the [place] FDA and  has been authorized for detection and/or diagnosis of SARS-CoV-2 by FDA under an Emergency Use Authorization (EUA). This EUA will remain  in effect (meaning this test can be used) for the duration of the COVID-19 declaration under Section 564(b)(1) of the Act, 21 U.S.C.section 360bbb-3(b)(1), unless the authorization is terminated  or revoked sooner.       Influenza Gaia Gullikson by PCR NEGATIVE NEGATIVE Final   Influenza B by PCR NEGATIVE NEGATIVE Final    Comment: (NOTE) The Xpert Xpress SARS-CoV-2/FLU/RSV plus assay is intended as an aid in the diagnosis  of influenza from Nasopharyngeal swab specimens and should not be used as Danyella Mcginty sole basis for treatment. Nasal washings and aspirates are unacceptable for Xpert Xpress SARS-CoV-2/FLU/RSV testing.  Fact Sheet for Patients: EntrepreneurPulse.com.au  Fact Sheet for Healthcare Providers: IncredibleEmployment.be  This test is not yet approved or cleared by the [place] FDA and has been authorized for detection and/or diagnosis of SARS-CoV-2 by FDA under an Emergency Use Authorization (EUA). This EUA will remain in effect (meaning this test can be used) for the duration of the COVID-19 declaration under Section 564(b)(1) of the Act, 21 U.S.C. section 360bbb-3(b)(1), unless the authorization is terminated or revoked.     Resp Syncytial Virus by PCR NEGATIVE NEGATIVE Final    Comment: (NOTE) Fact Sheet for Patients: EntrepreneurPulse.com.au  Fact Sheet for Healthcare Providers: IncredibleEmployment.be  This test is not yet approved or cleared by the [place] FDA and has been authorized for detection and/or  diagnosis of SARS-CoV-2 by FDA under an Emergency Use Authorization (EUA). This EUA will remain in effect (meaning this test can be used) for the duration of the COVID-19 declaration under Section 564(b)(1) of the Act, 21 U.S.C. section 360bbb-3(b)(1), unless the authorization is terminated or revoked.  Performed at [facility], [facility] [address] [place], [place] [address]          Radiology Studies: VAS [place] UPPER EXTREMITY VENOUS DUPLEX  Result Date: 07/03/2022 UPPER VENOUS STUDY  Patient Name:  SHAWNTEE MAINWARING  Date of Exam:   07/02/2022 Medical Rec #: [id number]      Accession #:    [id number] Date of Birth: 1961-02-27      Patient Gender: F Patient Age:   20 years Exam Location:  [facility] Procedure:      VAS [place] UPPER EXTREMITY VENOUS DUPLEX Referring Phys: Britain Anagnos POWELL JR --------------------------------------------------------------------------------  Indications: Edema Risk Factors: MS, Heart failure. Limitations: Body habitus, MS. Comparison Study: No prior study on file Performing Technologist: Sharion Dove RVS  Examination Guidelines: Kishon Garriga complete evaluation includes B-mode imaging, spectral Doppler, color Doppler, and power Doppler as needed of all accessible portions of each vessel. Bilateral testing is considered an integral part of Tyrihanna Wingert complete examination. Limited examinations for reoccurring indications may be performed as noted.  Left Findings: +----------+------------+---------+-----------+----------+---------------+ LEFT      CompressiblePhasicitySpontaneousProperties    Summary     +----------+------------+---------+-----------+----------+---------------+ IJV           Full       Yes       Yes                              +----------+------------+---------+-----------+----------+---------------+ Subclavian    Full                                                  +----------+------------+---------+-----------+----------+---------------+ Axillary                  Yes       Yes                              +----------+------------+---------+-----------+----------+---------------+ Brachial  patent by color +----------+------------+---------+-----------+----------+---------------+ Radial                                              Not visualized  +----------+------------+---------+-----------+----------+---------------+ Ulnar                                               Not visualized  +----------+------------+---------+-----------+----------+---------------+ Cephalic                                            patent by color +----------+------------+---------+-----------+----------+---------------+ Basilic                                             Not visualized  +----------+------------+---------+-----------+----------+---------------+  Summary:  Left: No obvious evidence of deep vein thrombosis or superficial vein thrombosis in the visualized veins of the upper extremity.  *See table(s) above for measurements and observations.  Diagnosing physician: Deitra Mayo MD Electronically signed by Deitra Mayo MD on 07/03/2022 at 3:49:59 PM.    Final    VAS [place] LOWER EXTREMITY VENOUS (DVT)  Result Date: 07/03/2022  Lower Venous DVT Study Patient Name:  KATALENA MALVEAUX  Date of Exam:   07/02/2022 Medical Rec #: [id number]      Accession #:    [id number] Date of Birth: 12-25-1960      Patient Gender: F Patient Age:   8 years Exam Location:  [facility] Procedure:      VAS [place] LOWER EXTREMITY VENOUS (DVT) Referring Phys: Syleena Mchan POWELL JR --------------------------------------------------------------------------------  Indications: Edema, and SOB.  Risk Factors: Heart failure, EF 20-25%. Limitations: Body habitus and MS, reduced EF, slow breathing. Comparison Study: Prior negative bilateral LEV done 10/14/13 Performing Technologist: Sharion Dove RVS  Examination Guidelines: Jalene Demo complete  evaluation includes B-mode imaging, spectral Doppler, color Doppler, and power Doppler as needed of all accessible portions of each vessel. Bilateral testing is considered an integral part of Carnita Golob complete examination. Limited examinations for reoccurring indications may be performed as noted. The reflux portion of the exam is performed with the patient in reverse Trendelenburg.  +---------+---------------+---------+-----------+----------+-------------------+ RIGHT    CompressibilityPhasicitySpontaneityPropertiesThrombus Aging      +---------+---------------+---------+-----------+----------+-------------------+ CFV      Full                                                             +---------+---------------+---------+-----------+----------+-------------------+ SFJ      Full                                                             +---------+---------------+---------+-----------+----------+-------------------+ FV Prox  Full                                                             +---------+---------------+---------+-----------+----------+-------------------+  FV Mid   Full           Yes      Yes                                      +---------+---------------+---------+-----------+----------+-------------------+ FV DistalFull                                                             +---------+---------------+---------+-----------+----------+-------------------+ PFV      Full                                                             +---------+---------------+---------+-----------+----------+-------------------+ POP      Full           Yes      Yes                                      +---------+---------------+---------+-----------+----------+-------------------+ PTV                                                   Not well visualized +---------+---------------+---------+-----------+----------+-------------------+ PERO                                                   Not well visualized +---------+---------------+---------+-----------+----------+-------------------+   +---------+---------------+---------+-----------+----------+-------------------+ LEFT     CompressibilityPhasicitySpontaneityPropertiesThrombus Aging      +---------+---------------+---------+-----------+----------+-------------------+ CFV      Full                                                             +---------+---------------+---------+-----------+----------+-------------------+ SFJ      Full                                                             +---------+---------------+---------+-----------+----------+-------------------+ FV Prox  Full                                                             +---------+---------------+---------+-----------+----------+-------------------+ FV Mid   Full                                                             +---------+---------------+---------+-----------+----------+-------------------+  FV DistalFull                                                             +---------+---------------+---------+-----------+----------+-------------------+ POP      Full                                                             +---------+---------------+---------+-----------+----------+-------------------+ PTV                                                   Not well visualized +---------+---------------+---------+-----------+----------+-------------------+ PERO                                                  Not well visualized +---------+---------------+---------+-----------+----------+-------------------+     Summary: RIGHT: - There is no evidence of deep vein thrombosis in the lower extremity. However, portions of this examination were limited- see technologist comments above.  - No cystic structure found in the popliteal fossa.  LEFT: - There is no evidence of deep vein  thrombosis in the lower extremity. However, portions of this examination were limited- see technologist comments above.  - No cystic structure found in the popliteal fossa.  *See table(s) above for measurements and observations. Electronically signed by Deitra Mayo MD on 07/03/2022 at 3:47:36 PM.    Final    MR BRAIN WO CONTRAST  Result Date: 07/03/2022 CLINICAL DATA:  Multiple sclerosis.  Right upper extremity weakness. EXAM: MRI HEAD WITHOUT CONTRAST TECHNIQUE: Multiplanar, multiecho pulse sequences of the brain and surrounding structures were obtained without intravenous contrast. COMPARISON:  Brain MRI 07/11/2015 FINDINGS: Brain: No acute infarction, hemorrhage, hydrocephalus, extra-axial collection or mass lesion. There are numerous white matter lesions in unchanged distribution since 07/11/2015. There are no new lesions. No chronic microhemorrhage. Unchanged brain volume. Vascular: Normal flow voids. Skull and upper cervical spine: Normal marrow signal. Sinuses/Orbits: Negative. Other: None. IMPRESSION: Unchanged distribution of numerous white matter lesions in Devanshi Califf pattern consistent with multiple sclerosis. No new lesions. No acute abnormality. Electronically Signed   By: Ulyses Jarred M.D.   On: 07/03/2022 00:18   ECHOCARDIOGRAM COMPLETE  Result Date: 07/02/2022    ECHOCARDIOGRAM REPORT   Patient Name:   Tarrie Mcmichen Date of Exam: 07/02/2022 Medical Rec #:  [id number]     Height:       65.0 in Accession #:    [id number]    Weight:       220.0 lb Date of Birth:  [birth year]     BSA:          2.060 m Patient Age:    13 years      BP:           133/71 mmHg Patient Gender: F             HR:  87 bpm. Exam Location:  [place] Procedure: 2D Echo, Cardiac Doppler, Color Doppler and Intracardiac            Opacification Agent Indications:    CHF-acute systolic  History:        Patient has prior history of Echocardiogram examinations, most                 recent 05/20/2020. CHF; Risk  Factors:Diabetes, Hypertension,                 Dyslipidemia and Former Smoker. Multiple sclerosis. Acute                 hypoxic respiratory therapy. Elevated troponin. GERD.  Sonographer:    Clayton Lefort RDCS (AE) Referring Phys: Rhetta Mura  Sonographer Comments: Patient is obese. Image acquisition challenging due to patient body habitus. Limited mobility. IMPRESSIONS  1. Left ventricular ejection fraction, by estimation, is 20 to 25%. The left ventricle has severely decreased function. The left ventricle demonstrates global hypokinesis. The left ventricular internal cavity size was severely dilated. There is mild left ventricular hypertrophy. Left ventricular diastolic parameters are indeterminate.  2. Right ventricular systolic function was not well visualized. The right ventricular size is not well visualized. Poor RV visualization but appears grossly normal size and systolic function. There is severely elevated pulmonary artery systolic pressure. The estimated right ventricular systolic pressure is 03.4 mmHg.  3. Left atrial size was moderately dilated.  4. The mitral valve is abnormal. Moderate to severe mitral valve regurgitation. Appears functional  5. Tricuspid valve regurgitation is moderate.  6. The aortic valve was not well visualized. Aortic valve regurgitation is trivial. No aortic stenosis is present.  7. The inferior vena cava is dilated in size with <50% respiratory variability, suggesting right atrial pressure of 15 mmHg. FINDINGS  Left Ventricle: Left ventricular ejection fraction, by estimation, is 20 to 25%. The left ventricle has severely decreased function. The left ventricle demonstrates global hypokinesis. The left ventricular internal cavity size was severely dilated. There is mild left ventricular hypertrophy. Left ventricular diastolic parameters are indeterminate. Right Ventricle: The right ventricular size is not well visualized. Right vetricular wall thickness was not well  visualized. Right ventricular systolic function was not well visualized. There is severely elevated pulmonary artery systolic pressure. The tricuspid regurgitant velocity is 3.96 m/s, and with an assumed right atrial pressure of 15 mmHg, the estimated right ventricular systolic pressure is 74.2 mmHg. Left Atrium: Left atrial size was moderately dilated. Right Atrium: Right atrial size was normal in size. Pericardium: There is no evidence of pericardial effusion. Mitral Valve: The mitral valve is abnormal. Moderate to severe mitral valve regurgitation. Tricuspid Valve: The tricuspid valve is normal in structure. Tricuspid valve regurgitation is moderate. Aortic Valve: The aortic valve was not well visualized. Aortic valve regurgitation is trivial. No aortic stenosis is present. Aortic valve mean gradient measures 4.0 mmHg. Aortic valve peak gradient measures 7.2 mmHg. Aortic valve area, by VTI measures 1.60 cm. Pulmonic Valve: The pulmonic valve was not well visualized. Pulmonic valve regurgitation is not visualized. Aorta: The aortic root is normal in size and structure. Venous: The inferior vena cava is dilated in size with less than 50% respiratory variability, suggesting right atrial pressure of 15 mmHg. IAS/Shunts: The interatrial septum was not well visualized.  LEFT VENTRICLE PLAX 2D LVIDd:         5.70 cm LVIDs:         5.00 cm LV PW:  1.30 cm LV IVS:        0.80 cm LVOT diam:     1.80 cm LV SV:         30 LV SV Index:   14 LVOT Area:     2.54 cm  RIGHT VENTRICLE             IVC RV Basal diam:  3.40 cm     IVC diam: 2.20 cm RV S prime:     11.10 cm/s TAPSE (M-mode): 1.9 cm LEFT ATRIUM             Index        RIGHT ATRIUM           Index LA diam:        4.70 cm 2.28 cm/m   RA Area:     17.10 cm LA Vol (A2C):   81.6 ml 39.61 ml/m  RA Volume:   50.00 ml  24.27 ml/m LA Vol (A4C):   83.4 ml 40.49 ml/m LA Biplane Vol: 85.1 ml 41.31 ml/m  AORTIC VALVE AV Area (Vmax):    1.64 cm AV Area (Vmean):    1.22 cm AV Area (VTI):     1.60 cm AV Vmax:           134.00 cm/s AV Vmean:          92.400 cm/s AV VTI:            0.185 m AV Peak Grad:      7.2 mmHg AV Mean Grad:      4.0 mmHg LVOT Vmax:         86.60 cm/s LVOT Vmean:        44.200 cm/s LVOT VTI:          0.116 m LVOT/AV VTI ratio: 0.63  AORTA Ao Root diam: 3.00 cm MR Peak grad:    89.9 mmHg    TRICUSPID VALVE MR Mean grad:    52.0 mmHg    TR Peak grad:   62.7 mmHg MR Vmax:         474.00 cm/s  TR Vmax:        396.00 cm/s MR Vmean:        333.0 cm/s MR PISA:         1.57 cm     SHUNTS MR PISA Eff ROA: 13 mm       Systemic VTI:  0.12 m MR PISA Radius:  0.50 cm      Systemic Diam: 1.80 cm Oswaldo Milian MD Electronically signed by Oswaldo Milian MD Signature Date/Time: 07/02/2022/10:36:02 AM    Final    DG Chest Portable 1 View  Result Date: 07/01/2022 CLINICAL DATA:  Shortness of breath EXAM: PORTABLE CHEST 1 VIEW COMPARISON:  05/01/2022 FINDINGS: Cardiomegaly with vascular congestion, mild edema and probable pleural effusions. Basilar consolidations. No pneumothorax IMPRESSION: Cardiomegaly with vascular congestion, mild edema and probable pleural effusions. Basilar consolidations may be due to atelectasis or pneumonia. Electronically Signed   By: Donavan Foil M.D.   On: 07/01/2022 16:59        Scheduled Meds:  amantadine  100 mg Oral Daily   aspirin EC  325 mg Oral Daily   baclofen  10 mg Oral TID   buPROPion  150 mg Oral Daily   dextromethorphan-guaiFENesin  1 tablet Oral BID   docusate sodium  100 mg Oral BID   DULoxetine  60 mg Oral Daily   ferrous sulfate  325 mg Oral  BID WC   fluticasone  1 spray Each Nare QHS   furosemide  40 mg Intravenous BID   gabapentin  800 mg Oral TID   insulin aspart  0-9 Units Subcutaneous TID WC   latanoprost  1 drop Both Eyes QHS   linaclotide  290 mcg Oral QAC breakfast   [START ON 07/04/2022] losartan  12.5 mg Oral Daily   magnesium oxide  400 mg Oral Daily   metoprolol succinate  75 mg  Oral Daily   potassium chloride SA  20 mEq Oral Daily   senna-docusate  2 tablet Oral BID   simvastatin  10 mg Oral QHS   spironolactone  25 mg Oral Daily   Continuous Infusions:   LOS: 2 days    Time spent: over 30 min    Fayrene Helper, MD [facility]   To contact the attending provider between 7A-7P or the covering provider during after hours 7P-7A, please log into the web site [email or web address] and access using universal [place] password for that web site. If you do not have the password, please call the hospital operator.  07/03/2022, 4:27 PM

## 2022-07-03 NOTE — Progress Notes (Signed)
Heart Failure Navigator Progress Note  Assessed for Heart & Vascular TOC clinic readiness.  Patient wheel chair bound from Multiple Sclerosis, .   Navigator available for reassessment of patient.   Earnestine Leys, BSN, Clinical cytogeneticist Only

## 2022-07-03 NOTE — Evaluation (Signed)
Physical Therapy Evaluation - One -time  Patient Details Name: Shannon Garrett MRN: [id number] DOB: [birth year] Today's Date: 07/03/2022  History of Present Illness  62 yo female presents to ED on 12/31 from SNF with ShOB and lethargy requiring 15L in ED. PMH: MS (bedlevel), GERD, HTN, glaucoma, depression, cardiomyopathy, CHF, neurogenic bladder with recurrent UTIs  Clinical Impression   Pt presents with baseline level of weakness, pt is total assist for transfer OOB at SNF and states most of her goals at this point are UB bathing, fine motor control, and pressure relief. PT assisted pt with positioning for pulmonary health and pressure relief, attempted rolling but with total assist +1 unable to do more than clear buttocks for roll. PT recommending return to SNF, pt expressing interest in restarting therapies there. PT to sign off given baseline level of functioning.         Recommendations for follow up therapy are one component of a multi-disciplinary discharge planning process, led by the attending physician.  Recommendations may be updated based on patient status, additional functional criteria and insurance authorization.  Follow Up Recommendations Skilled nursing-short term rehab (<3 hours/day) Can patient physically be transported by private vehicle: No    Assistance Recommended at Discharge Frequent or constant Supervision/Assistance  Patient can return home with the following  Two people to help with walking and/or transfers;Two people to help with bathing/dressing/bathroom    Equipment Recommendations None recommended by PT  Recommendations for Other Services       Functional Status Assessment Patient has had a recent decline in their functional status and/or demonstrates limited ability to make significant improvements in function in a reasonable and predictable amount of time     Precautions / Restrictions Precautions Precautions: Fall Restrictions Weight Bearing  Restrictions: No      Mobility  Bed Mobility Overal bed mobility: Needs Assistance Bed Mobility: Rolling Rolling: Total assist         General bed mobility comments: attempted roll to L, total assist for minimal weight shift only. Wil require +2-3 for completion of roll    Transfers                        Ambulation/Gait                  Stairs            Wheelchair Mobility    Modified Rankin (Stroke Patients Only)       Balance                                             Pertinent Vitals/Pain Pain Assessment Pain Assessment: Faces Faces Pain Scale: Hurts little more Pain Location: back, LUE (swelling) Pain Descriptors / Indicators: Sore, Discomfort Pain Intervention(s): Limited activity within patient's tolerance, Monitored during session, Repositioned    Home Living Family/patient expects to be discharged to:: Skilled nursing facility                        Prior Function Prior Level of Function : Needs assist             Mobility Comments: pt hoyer lifts OOB ADLs Comments: total care     Hand Dominance   Dominant Hand: Right    Extremity/Trunk Assessment   Upper Extremity Assessment Upper Extremity Assessment:  Defer to OT evaluation    Lower Extremity Assessment Lower Extremity Assessment: LLE deficits/detail;RLE deficits/detail RLE Deficits / Details: quad contraction only LLE Deficits / Details: foot drop, 0/5 LLE    Cervical / Trunk Assessment Cervical / Trunk Assessment: Other exceptions Cervical / Trunk Exceptions: unable to assess, pt is bedlevel  Communication   Communication: No difficulties;Other (comment) (soft spoken at times)  Cognition Arousal/Alertness: Awake/alert Behavior During Therapy: WFL for tasks assessed/performed Overall Cognitive Status: Within Functional Limits for tasks assessed                                          General Comments  General comments (skin integrity, edema, etc.): on RA, SPO2 96-98%. Pericare assist given pt with smear of BM. pt positioned in chair position with LUE propped in elevation given significant swelling. prevalon boots donned    Exercises     Assessment/Plan    PT Assessment All further PT needs can be met in the next venue of care  PT Problem List Decreased strength;Decreased mobility;Impaired tone;Decreased activity tolerance;Decreased range of motion;Decreased balance;Pain;Cardiopulmonary status limiting activity       PT Treatment Interventions      PT Goals (Current goals can be found in the Care Plan section)  Acute Rehab PT Goals PT Goal Formulation: With patient Time For Goal Achievement: 07/03/22 Potential to Achieve Goals: Fair    Frequency       Co-evaluation               AM-PAC PT "6 Clicks" Mobility  Outcome Measure Help needed turning from your back to your side while in a flat bed without using bedrails?: Total Help needed moving from lying on your back to sitting on the side of a flat bed without using bedrails?: Total Help needed moving to and from a bed to a chair (including a wheelchair)?: Total Help needed standing up from a chair using your arms (e.g., wheelchair or bedside chair)?: Total Help needed to walk in hospital room?: Total Help needed climbing 3-5 steps with a railing? : Total 6 Click Score: 6    End of Session   Activity Tolerance: Patient limited by fatigue Patient left: in bed;with call bell/phone within reach;with bed alarm set Nurse Communication: Mobility status PT Visit Diagnosis: Muscle weakness (generalized) (M62.81)    Time: 2080-2233 PT Time Calculation (min) (ACUTE ONLY): 25 min   Charges:   PT Evaluation $PT Eval Low Complexity: 1 Low          Stacie Glaze, PT DPT Acute Rehabilitation Services Pager [phone]  Office [phone]   Roxine Caddy E Ruffin Pyo 07/03/2022, 5:17 PM

## 2022-07-03 NOTE — Progress Notes (Signed)
CSW met with pt at bedside. Pt disclosed she is interested dc to another facility. Pt reported that she doesn't feel that she's being properly cared for at her current SNF. CSW informed pt that she can also discuss this with the social worker at [facility]. CSW completed fl2 and faxed referrals out to multiple SNFs informing them pt will also need to be LTC.    Beckey Rutter, MSW, LCSWA, LCASA Transitions of Care  Clinical Social Worker I

## 2022-07-04 DIAGNOSIS — I5043 Acute on chronic combined systolic (congestive) and diastolic (congestive) heart failure: Secondary | ICD-10-CM | POA: Diagnosis not present

## 2022-07-04 LAB — GLUCOSE, CAPILLARY
Glucose-Capillary: 102 mg/dL — ABNORMAL HIGH (ref 70–99)
Glucose-Capillary: 129 mg/dL — ABNORMAL HIGH (ref 70–99)
Glucose-Capillary: 127 mg/dL — ABNORMAL HIGH (ref 70–99)
Glucose-Capillary: 117 mg/dL — ABNORMAL HIGH (ref 70–99)

## 2022-07-04 LAB — BASIC METABOLIC PANEL
Anion gap: 12 (ref 5–15)
BUN: 9 mg/dL (ref 6–20)
CO2: 28 mmol/L (ref 22–32)
Calcium: 8.3 mg/dL — ABNORMAL LOW (ref 8.9–10.3)
Chloride: 98 mmol/L (ref 98–111)
Creatinine, Ser: 0.67 mg/dL (ref 0.44–1.00)
GFR, Estimated: >60 mL/min (ref >60)
Glucose, Bld: 143 mg/dL — ABNORMAL HIGH (ref 70–99)
Potassium: 3.7 mmol/L (ref 3.5–5.1)
Sodium: 138 mmol/L (ref 135–145)

## 2022-07-04 LAB — CBC
HCT: 34 % — ABNORMAL LOW (ref 36.0–46.0)
Hemoglobin: 10.8 g/dL — ABNORMAL LOW (ref 12.0–15.0)
MCH: 27.6 pg (ref 26.0–34.0)
MCHC: 31.8 g/dL (ref 30.0–36.0)
MCV: 87 fL (ref 80.0–100.0)
Platelets: 249 10*3/uL (ref 150–400)
RBC: 3.91 MIL/uL (ref 3.87–5.11)
RDW: 16.3 % — ABNORMAL HIGH (ref 11.5–15.5)
WBC: 5.8 10*3/uL (ref 4.0–10.5)
nRBC: 0 % (ref 0.0–0.2)

## 2022-07-04 LAB — MAGNESIUM: Magnesium: 1.6 mg/dL — ABNORMAL LOW (ref 1.7–2.4)

## 2022-07-04 LAB — PHOSPHORUS: Phosphorus: 2.3 mg/dL — ABNORMAL LOW (ref 2.5–4.6)

## 2022-07-04 MED ORDER — MAGNESIUM SULFATE 2 GM/50ML IV SOLN
2.0000 g | Freq: Once | INTRAVENOUS | Status: AC
  Administered 2022-07-04: 2 g via INTRAVENOUS
  Filled 2022-07-04: qty 50

## 2022-07-04 MED ORDER — ENOXAPARIN SODIUM 40 MG/0.4ML IJ SOSY
40.0000 mg | PREFILLED_SYRINGE | INTRAMUSCULAR | Status: DC
  Administered 2022-07-04 – 2022-07-05 (×2): 40 mg via SUBCUTANEOUS
  Filled 2022-07-04 (×2): qty 0.4

## 2022-07-04 MED ORDER — IPRATROPIUM-ALBUTEROL 0.5-2.5 (3) MG/3ML IN SOLN
3.0000 mL | Freq: Three times a day (TID) | RESPIRATORY_TRACT | Status: DC
  Administered 2022-07-04 – 2022-07-05 (×2): 3 mL via RESPIRATORY_TRACT
  Filled 2022-07-04 (×3): qty 3

## 2022-07-04 MED ORDER — POTASSIUM CHLORIDE CRYS ER 20 MEQ PO TBCR
40.0000 meq | EXTENDED_RELEASE_TABLET | Freq: Once | ORAL | Status: AC
  Administered 2022-07-04: 40 meq via ORAL
  Filled 2022-07-04: qty 2

## 2022-07-04 MED ORDER — GABAPENTIN 300 MG PO CAPS
600.0000 mg | ORAL_CAPSULE | Freq: Three times a day (TID) | ORAL | Status: DC
  Administered 2022-07-04 – 2022-07-06 (×6): 600 mg via ORAL
  Filled 2022-07-04 (×6): qty 2

## 2022-07-04 NOTE — Progress Notes (Addendum)
Rounding Note    Patient Name: Shannon Garrett Date of Encounter: 07/04/2022  [place] Cardiologist: Peter Martinique, MD   Subjective   Patient reports that she feels better today and has noticed decrease in swelling/edema. She continues to intermittently feel short of breath with upper airway mucus/secretions. Denies chest pain or palpitations.  Inpatient Medications    Scheduled Meds:  amantadine  100 mg Oral Daily   aspirin EC  325 mg Oral Daily   baclofen  10 mg Oral TID   buPROPion  150 mg Oral Daily   dextromethorphan-guaiFENesin  1 tablet Oral BID   docusate sodium  100 mg Oral BID   DULoxetine  60 mg Oral Daily   ferrous sulfate  325 mg Oral BID WC   fluticasone  1 spray Each Nare QHS   furosemide  40 mg Intravenous BID   gabapentin  800 mg Oral TID   insulin aspart  0-9 Units Subcutaneous TID WC   latanoprost  1 drop Both Eyes QHS   linaclotide  290 mcg Oral QAC breakfast   losartan  12.5 mg Oral Daily   magnesium oxide  400 mg Oral Daily   metoprolol succinate  75 mg Oral Daily   potassium chloride SA  20 mEq Oral Daily   senna-docusate  2 tablet Oral BID   simvastatin  10 mg Oral QHS   spironolactone  25 mg Oral Daily   Continuous Infusions:  PRN Meds: acetaminophen **OR** acetaminophen, albuterol, melatonin, polyethylene glycol, traMADol   Vital Signs    Vitals:   07/04/22 0420 07/04/22 0421 07/04/22 0711 07/04/22 0817  BP: (!) 102/50  (!) 95/51 116/68  Pulse:   79 88  Resp:   18   Temp: 98.7 F (37.1 C)  98.1 F (36.7 C)   TempSrc: Oral  Oral   SpO2:   100%   Weight:  102.1 kg    Height:        Intake/Output Summary (Last 24 hours) at 07/04/2022 0917 Last data filed at 07/04/2022 0800 Gross per 24 hour  Intake 1832 ml  Output 3725 ml  Net -1893 ml      07/04/2022    4:21 AM 07/03/2022    4:28 AM 07/01/2022    4:18 PM  Last 3 Weights  Weight (lbs) 225 lb 1.4 oz 224 lb 13.9 oz 220 lb  Weight (kg) 102.1 kg 102 kg 99.791 kg       Telemetry    Sinus rhythm - Personally Reviewed  ECG    No new tracing  Physical Exam   GEN: No acute distress.   Neck: No JVD Cardiac: RRR, no murmurs, rubs, or gallops.  Respiratory: Upper airway rhonchi GI: Soft, nontender, non-distended  MS: 1+ bilateral lower extremity edema. Improved from yesterday. Left arm swollen, reported as chronic by patient. Chronic MSK weakness 2/2 MS. Neuro:  Nonfocal  Psych: Normal affect   Labs    High Sensitivity Troponin:   Recent Labs  Lab 07/01/22 1616 07/01/22 1817 07/02/22 0500  TROPONINIHS 18* 19* 20*     Chemistry Recent Labs  Lab 07/01/22 1616 07/01/22 1817 07/02/22 0500 07/03/22 0056 07/04/22 0037  NA 145   < > 144 144 138  K 4.1   < > 3.5 3.4* 3.7  CL 108  --  108 101 98  CO2 32  --  30 32 28  GLUCOSE 110*  --  122* 114* 143*  BUN 10  --  10 10  9  CREATININE 0.87  --  0.80 0.92 0.67  CALCIUM 8.4*  --  8.4* 8.5* 8.3*  MG  --    < > 1.7 1.7 1.6*  PROT 5.7*  --  5.9* 5.8*  --   ALBUMIN 2.8*  --  2.9* 2.9*  --   AST 17  --  16 16  --   ALT 17  --  17 15  --   ALKPHOS 126  --  128* 137*  --   BILITOT 0.5  --  0.4 0.4  --   GFRNONAA >60  --  >60 >60 >60  ANIONGAP 5  --  '6 11 12   '$ < > = values in this interval not displayed.    Lipids No results for input(s): "CHOL", "TRIG", "HDL", "LABVLDL", "LDLCALC", "CHOLHDL" in the last 168 hours.  Hematology Recent Labs  Lab 07/02/22 0500 07/03/22 0056 07/04/22 0037  WBC 6.8 5.6 5.8  RBC 3.87 3.88 3.91  HGB 10.9* 10.9* 10.8*  HCT 33.8* 33.1* 34.0*  MCV 87.3 85.3 87.0  MCH 28.2 28.1 27.6  MCHC 32.2 32.9 31.8  RDW 16.2* 16.3* 16.3*  PLT 269 249 249   Thyroid No results for input(s): "TSH", "FREET4" in the last 168 hours.  BNP Recent Labs  Lab 07/01/22 1617  BNP 3,102.7*    DDimer No results for input(s): "DDIMER" in the last 168 hours.   Radiology    VAS [place] UPPER EXTREMITY VENOUS DUPLEX  Result Date: 07/03/2022 UPPER VENOUS STUDY  Patient Name:  KYREN VAUX  Date of Exam:   07/02/2022 Medical Rec #: [id number]      Accession #:    [id number] Date of Birth: 1961-01-21      Patient Gender: F Patient Age:   67 years Exam Location:  [facility] Procedure:      VAS [place] UPPER EXTREMITY VENOUS DUPLEX Referring Phys: A POWELL JR --------------------------------------------------------------------------------  Indications: Edema Risk Factors: MS, Heart failure. Limitations: Body habitus, MS. Comparison Study: No prior study on file Performing Technologist: Sharion Dove RVS  Examination Guidelines: A complete evaluation includes B-mode imaging, spectral Doppler, color Doppler, and power Doppler as needed of all accessible portions of each vessel. Bilateral testing is considered an integral part of a complete examination. Limited examinations for reoccurring indications may be performed as noted.  Left Findings: +----------+------------+---------+-----------+----------+---------------+ LEFT      CompressiblePhasicitySpontaneousProperties    Summary     +----------+------------+---------+-----------+----------+---------------+ IJV           Full       Yes       Yes                              +----------+------------+---------+-----------+----------+---------------+ Subclavian    Full                                                  +----------+------------+---------+-----------+----------+---------------+ Axillary                 Yes       Yes                              +----------+------------+---------+-----------+----------+---------------+ Brachial  patent by color +----------+------------+---------+-----------+----------+---------------+ Radial                                              Not visualized  +----------+------------+---------+-----------+----------+---------------+ Ulnar                                               Not visualized   +----------+------------+---------+-----------+----------+---------------+ Cephalic                                            patent by color +----------+------------+---------+-----------+----------+---------------+ Basilic                                             Not visualized  +----------+------------+---------+-----------+----------+---------------+  Summary:  Left: No obvious evidence of deep vein thrombosis or superficial vein thrombosis in the visualized veins of the upper extremity.  *See table(s) above for measurements and observations.  Diagnosing physician: Deitra Mayo MD Electronically signed by Deitra Mayo MD on 07/03/2022 at 3:49:59 PM.    Final    VAS [place] LOWER EXTREMITY VENOUS (DVT)  Result Date: 07/03/2022  Lower Venous DVT Study Patient Name:  TEYLA SKIDGEL  Date of Exam:   07/02/2022 Medical Rec #: [id number]      Accession #:    [id number] Date of Birth: April 23, 1961      Patient Gender: F Patient Age:   61 years Exam Location:  [facility] Procedure:      VAS [place] LOWER EXTREMITY VENOUS (DVT) Referring Phys: A POWELL JR --------------------------------------------------------------------------------  Indications: Edema, and SOB.  Risk Factors: Heart failure, EF 20-25%. Limitations: Body habitus and MS, reduced EF, slow breathing. Comparison Study: Prior negative bilateral LEV done 10/14/13 Performing Technologist: Sharion Dove RVS  Examination Guidelines: A complete evaluation includes B-mode imaging, spectral Doppler, color Doppler, and power Doppler as needed of all accessible portions of each vessel. Bilateral testing is considered an integral part of a complete examination. Limited examinations for reoccurring indications may be performed as noted. The reflux portion of the exam is performed with the patient in reverse Trendelenburg.  +---------+---------------+---------+-----------+----------+-------------------+ RIGHT     CompressibilityPhasicitySpontaneityPropertiesThrombus Aging      +---------+---------------+---------+-----------+----------+-------------------+ CFV      Full                                                             +---------+---------------+---------+-----------+----------+-------------------+ SFJ      Full                                                             +---------+---------------+---------+-----------+----------+-------------------+ FV Prox  Full                                                             +---------+---------------+---------+-----------+----------+-------------------+  FV Mid   Full           Yes      Yes                                      +---------+---------------+---------+-----------+----------+-------------------+ FV DistalFull                                                             +---------+---------------+---------+-----------+----------+-------------------+ PFV      Full                                                             +---------+---------------+---------+-----------+----------+-------------------+ POP      Full           Yes      Yes                                      +---------+---------------+---------+-----------+----------+-------------------+ PTV                                                   Not well visualized +---------+---------------+---------+-----------+----------+-------------------+ PERO                                                  Not well visualized +---------+---------------+---------+-----------+----------+-------------------+   +---------+---------------+---------+-----------+----------+-------------------+ LEFT     CompressibilityPhasicitySpontaneityPropertiesThrombus Aging      +---------+---------------+---------+-----------+----------+-------------------+ CFV      Full                                                              +---------+---------------+---------+-----------+----------+-------------------+ SFJ      Full                                                             +---------+---------------+---------+-----------+----------+-------------------+ FV Prox  Full                                                             +---------+---------------+---------+-----------+----------+-------------------+ FV Mid   Full                                                             +---------+---------------+---------+-----------+----------+-------------------+  FV DistalFull                                                             +---------+---------------+---------+-----------+----------+-------------------+ POP      Full                                                             +---------+---------------+---------+-----------+----------+-------------------+ PTV                                                   Not well visualized +---------+---------------+---------+-----------+----------+-------------------+ PERO                                                  Not well visualized +---------+---------------+---------+-----------+----------+-------------------+     Summary: RIGHT: - There is no evidence of deep vein thrombosis in the lower extremity. However, portions of this examination were limited- see technologist comments above.  - No cystic structure found in the popliteal fossa.  LEFT: - There is no evidence of deep vein thrombosis in the lower extremity. However, portions of this examination were limited- see technologist comments above.  - No cystic structure found in the popliteal fossa.  *See table(s) above for measurements and observations. Electronically signed by Deitra Mayo MD on 07/03/2022 at 3:47:36 PM.    Final    MR BRAIN WO CONTRAST  Result Date: 07/03/2022 CLINICAL DATA:  Multiple sclerosis.  Right upper extremity weakness. EXAM: MRI HEAD WITHOUT  CONTRAST TECHNIQUE: Multiplanar, multiecho pulse sequences of the brain and surrounding structures were obtained without intravenous contrast. COMPARISON:  Brain MRI 07/11/2015 FINDINGS: Brain: No acute infarction, hemorrhage, hydrocephalus, extra-axial collection or mass lesion. There are numerous white matter lesions in unchanged distribution since 07/11/2015. There are no new lesions. No chronic microhemorrhage. Unchanged brain volume. Vascular: Normal flow voids. Skull and upper cervical spine: Normal marrow signal. Sinuses/Orbits: Negative. Other: None. IMPRESSION: Unchanged distribution of numerous white matter lesions in a pattern consistent with multiple sclerosis. No new lesions. No acute abnormality. Electronically Signed   By: Ulyses Jarred M.D.   On: 07/03/2022 00:18   ECHOCARDIOGRAM COMPLETE  Result Date: 07/02/2022    ECHOCARDIOGRAM REPORT   Patient Name:   Eliyah Mcshea Date of Exam: 07/02/2022 Medical Rec #:  [id number]     Height:       65.0 in Accession #:    [id number]    Weight:       220.0 lb Date of Birth:  1960/07/03     BSA:          2.060 m Patient Age:    89 years      BP:           133/71 mmHg Patient Gender: F             HR:  87 bpm. Exam Location:  [place] Procedure: 2D Echo, Cardiac Doppler, Color Doppler and Intracardiac            Opacification Agent Indications:    CHF-acute systolic  History:        Patient has prior history of Echocardiogram examinations, most                 recent 05/20/2020. CHF; Risk Factors:Diabetes, Hypertension,                 Dyslipidemia and Former Smoker. Multiple sclerosis. Acute                 hypoxic respiratory therapy. Elevated troponin. GERD.  Sonographer:    Clayton Lefort RDCS (AE) Referring Phys: Rhetta Mura  Sonographer Comments: Patient is obese. Image acquisition challenging due to patient body habitus. Limited mobility. IMPRESSIONS  1. Left ventricular ejection fraction, by estimation, is 20 to 25%. The left ventricle has  severely decreased function. The left ventricle demonstrates global hypokinesis. The left ventricular internal cavity size was severely dilated. There is mild left ventricular hypertrophy. Left ventricular diastolic parameters are indeterminate.  2. Right ventricular systolic function was not well visualized. The right ventricular size is not well visualized. Poor RV visualization but appears grossly normal size and systolic function. There is severely elevated pulmonary artery systolic pressure. The estimated right ventricular systolic pressure is 86.5 mmHg.  3. Left atrial size was moderately dilated.  4. The mitral valve is abnormal. Moderate to severe mitral valve regurgitation. Appears functional  5. Tricuspid valve regurgitation is moderate.  6. The aortic valve was not well visualized. Aortic valve regurgitation is trivial. No aortic stenosis is present.  7. The inferior vena cava is dilated in size with <50% respiratory variability, suggesting right atrial pressure of 15 mmHg. FINDINGS  Left Ventricle: Left ventricular ejection fraction, by estimation, is 20 to 25%. The left ventricle has severely decreased function. The left ventricle demonstrates global hypokinesis. The left ventricular internal cavity size was severely dilated. There is mild left ventricular hypertrophy. Left ventricular diastolic parameters are indeterminate. Right Ventricle: The right ventricular size is not well visualized. Right vetricular wall thickness was not well visualized. Right ventricular systolic function was not well visualized. There is severely elevated pulmonary artery systolic pressure. The tricuspid regurgitant velocity is 3.96 m/s, and with an assumed right atrial pressure of 15 mmHg, the estimated right ventricular systolic pressure is 78.4 mmHg. Left Atrium: Left atrial size was moderately dilated. Right Atrium: Right atrial size was normal in size. Pericardium: There is no evidence of pericardial effusion. Mitral  Valve: The mitral valve is abnormal. Moderate to severe mitral valve regurgitation. Tricuspid Valve: The tricuspid valve is normal in structure. Tricuspid valve regurgitation is moderate. Aortic Valve: The aortic valve was not well visualized. Aortic valve regurgitation is trivial. No aortic stenosis is present. Aortic valve mean gradient measures 4.0 mmHg. Aortic valve peak gradient measures 7.2 mmHg. Aortic valve area, by VTI measures 1.60 cm. Pulmonic Valve: The pulmonic valve was not well visualized. Pulmonic valve regurgitation is not visualized. Aorta: The aortic root is normal in size and structure. Venous: The inferior vena cava is dilated in size with less than 50% respiratory variability, suggesting right atrial pressure of 15 mmHg. IAS/Shunts: The interatrial septum was not well visualized.  LEFT VENTRICLE PLAX 2D LVIDd:         5.70 cm LVIDs:         5.00 cm LV PW:  1.30 cm LV IVS:        0.80 cm LVOT diam:     1.80 cm LV SV:         30 LV SV Index:   14 LVOT Area:     2.54 cm  RIGHT VENTRICLE             IVC RV Basal diam:  3.40 cm     IVC diam: 2.20 cm RV S prime:     11.10 cm/s TAPSE (M-mode): 1.9 cm LEFT ATRIUM             Index        RIGHT ATRIUM           Index LA diam:        4.70 cm 2.28 cm/m   RA Area:     17.10 cm LA Vol (A2C):   81.6 ml 39.61 ml/m  RA Volume:   50.00 ml  24.27 ml/m LA Vol (A4C):   83.4 ml 40.49 ml/m LA Biplane Vol: 85.1 ml 41.31 ml/m  AORTIC VALVE AV Area (Vmax):    1.64 cm AV Area (Vmean):   1.22 cm AV Area (VTI):     1.60 cm AV Vmax:           134.00 cm/s AV Vmean:          92.400 cm/s AV VTI:            0.185 m AV Peak Grad:      7.2 mmHg AV Mean Grad:      4.0 mmHg LVOT Vmax:         86.60 cm/s LVOT Vmean:        44.200 cm/s LVOT VTI:          0.116 m LVOT/AV VTI ratio: 0.63  AORTA Ao Root diam: 3.00 cm MR Peak grad:    89.9 mmHg    TRICUSPID VALVE MR Mean grad:    52.0 mmHg    TR Peak grad:   62.7 mmHg MR Vmax:         474.00 cm/s  TR Vmax:         396.00 cm/s MR Vmean:        333.0 cm/s MR PISA:         1.57 cm     SHUNTS MR PISA Eff ROA: 13 mm       Systemic VTI:  0.12 m MR PISA Radius:  0.50 cm      Systemic Diam: 1.80 cm Oswaldo Milian MD Electronically signed by Oswaldo Milian MD Signature Date/Time: 07/02/2022/10:36:02 AM    Final     Cardiac Studies   07/02/22 TTE   IMPRESSIONS     1. Left ventricular ejection fraction, by estimation, is 20 to 25%. The  left ventricle has severely decreased function. The left ventricle  demonstrates global hypokinesis. The left ventricular internal cavity size  was severely dilated. There is mild  left ventricular hypertrophy. Left ventricular diastolic parameters are  indeterminate.   2. Right ventricular systolic function was not well visualized. The right  ventricular size is not well visualized. Poor RV visualization but appears  grossly normal size and systolic function. There is severely elevated  pulmonary artery systolic  pressure. The estimated right ventricular systolic pressure is 29.4 mmHg.   3. Left atrial size was moderately dilated.   4. The mitral valve is abnormal. Moderate to severe mitral valve  regurgitation. Appears functional   5. Tricuspid valve  regurgitation is moderate.   6. The aortic valve was not well visualized. Aortic valve regurgitation  is trivial. No aortic stenosis is present.   7. The inferior vena cava is dilated in size with <50% respiratory  variability, suggesting right atrial pressure of 15 mmHg.   FINDINGS   Left Ventricle: Left ventricular ejection fraction, by estimation, is 20  to 25%. The left ventricle has severely decreased function. The left  ventricle demonstrates global hypokinesis. The left ventricular internal  cavity size was severely dilated.  There is mild left ventricular hypertrophy. Left ventricular diastolic  parameters are indeterminate.   Right Ventricle: The right ventricular size is not well visualized. Right   vetricular wall thickness was not well visualized. Right ventricular  systolic function was not well visualized. There is severely elevated  pulmonary artery systolic pressure. The  tricuspid regurgitant velocity is 3.96 m/s, and with an assumed right  atrial pressure of 15 mmHg, the estimated right ventricular systolic  pressure is 16.3 mmHg.   Left Atrium: Left atrial size was moderately dilated.   Right Atrium: Right atrial size was normal in size.   Pericardium: There is no evidence of pericardial effusion.   Mitral Valve: The mitral valve is abnormal. Moderate to severe mitral  valve regurgitation.   Tricuspid Valve: The tricuspid valve is normal in structure. Tricuspid  valve regurgitation is moderate.   Aortic Valve: The aortic valve was not well visualized. Aortic valve  regurgitation is trivial. No aortic stenosis is present. Aortic valve mean  gradient measures 4.0 mmHg. Aortic valve peak gradient measures 7.2 mmHg.  Aortic valve area, by VTI measures  1.60 cm.   Pulmonic Valve: The pulmonic valve was not well visualized. Pulmonic valve  regurgitation is not visualized.   Aorta: The aortic root is normal in size and structure.   Venous: The inferior vena cava is dilated in size with less than 50%  respiratory variability, suggesting right atrial pressure of 15 mmHg.   IAS/Shunts: The interatrial septum was not well visualized.   Patient Profile     Emmajo Bennette is a 62 y.o. female with a hx of (HFrEF) heart failure with reduced ejection fraction secondary to non-ischemic cardiomyopathy, hypertension, diabetes mellitus, SVT, multiple sclerosis, adrenal insufficiency, depression, glaucoma, GERD, obesity, aortic atherosclerosis, anemia of chronic disease, intermittent left bundle branch block who is being seen 07/02/2022 for the evaluation of decompensated heart failure at the request of Dr Velia Meyer.    Assessment & Plan    Acute on Chronic heart failure with reduced  ejection fraction (HFrEF) Non-ischemic cardiomyopathy   Patient admitted on 07/02/22 with symptoms of acute HF. Reports 20 lbs weight gain. LVEF 35-40% in 2021 and LHC with no CAD.  LVEF on repeat echo this admission shows decrease to 20-25%, global hypokinesis, severe LV dilatation, grossly normal RV systolic function, severely elevated RVSP (77 mmHg), moderate to severe mitral regurgitation (functional), moderate tricuspid regurgitation. BNP elevated to 3102.7. Lactate 1.3.   Patient now net negative 6.4 L following IV lasix '40mg'$  x5 and has improved respiratory status from admission. Continue IV diuresis today. Improved air movement noted with 1+ bilateral lower extremity edema. Continue to closely monitor I/O and weight. Daily fluid intake should be limited to <1515m. Continue GDMT: Spironolactone '25mg'$  Losartan 12.'5mg'$  (decreased with hypotension) Toprol '75mg'$  (reduced in the setting of acute exacerbation) [facility]  Concern for dysphagia   Patient with longstanding MS continues to have upper airway secretion sounds noted on lung physical  exam. Would consider speech evaluation.    Hypertension   Patient's blood pressure well controlled with HF GDMT. Losartan added this admission, reduced to 12.'5mg'$  with low-normal BP.   Elevated troponin   Patient with troponin mildly elevated but flat (18, 19, 20) in the setting of acute CHF. Consistent with demand ischemia. No chest pain today.   Aortic atherosclerosis   Patient on ASA, statin. Appears that she has been taking '325mg'$  ASA daily, decrease to '81mg'$ .   LUE swelling  Upper extremity doppler negative for DVT.   Per primary team   MS DM Urinary pain     For questions or updates, please contact [place] Please consult [email or web address] for contact info under        Signed, Lily Kocher, PA-C  07/04/2022, 9:17 AM    Personally seen and examined. Agree with above.  62 year old DNR patient with  nonischemic cardiomyopathy EF in the 20 to 25% range.  Continue with goal-directed medical therapy which includes spironolactone 25, losartan 12.5 Toprol 75.  It would be challenging to utilize [place] given her hypotension.  We are continuing with IV Lasix 40 mg twice daily.  Lower extremity edema is improved.  Breathing is improved.  Negative DVT left upper extremity. Myocardial injury in the setting of heart failure noted.  Troponin mildly elevated and flat. Concerned for potential dysphagia given her multiple sclerosis.  Upper airway noises noted may have challenges clearing secretions. Secondary pulmonary hypertension noted with estimated pressures of 77 mmHg.  Moderate to severe mitral regurgitation functional also noted.  Continue with aggressive medical therapy.  Candee Furbish, MD

## 2022-07-04 NOTE — Evaluation (Signed)
Occupational Therapy Evaluation Patient Details Name: Shannon Garrett MRN: [id number] DOB: 1961-03-07 Today's Date: 07/04/2022   History of Present Illness 62 yo female presents to ED on 12/31 from SNF with SOB and lethargy requiring 15L in ED. PMH: MS (bedlevel), GERD, HTN, glaucoma, depression, cardiomyopathy, CHF, neurogenic bladder with recurrent UTIs   Clinical Impression   This 62 yo female admitted with above presents to acute OT with PLOF of needing A for all basic ADLs and stated she would like to be able to feed herself, bath her UB, and increase use of RUE. Her LUE is edematous and hand tends to stay in contracted pattern (but you can easily get it open--resting hand splint has been ordered to try) due to MS. She reports her LUE has only been edematous for the last 2 months, there is noted a rather large bulge just distal to elbow when forearm in supinated (MD made aware of both of these via secure chat). Pt currently is able to wash her face with washcloth once washcloth placed in her hand and get hand to mouth (reports she has a built up spoon and fork at SNF). Pt will continue to benefit from acute OT with follow up OT at SNF to address her desire for bathing, grooming, and use of RUE. We will continue to follow.      Recommendations for follow up therapy are one component of a multi-disciplinary discharge planning process, led by the attending physician.  Recommendations may be updated based on patient status, additional functional criteria and insurance authorization.   Follow Up Recommendations   (long-term institutional care with OT follow up for self feeding, Bil UE ROM, and UBD.)     Assistance Recommended at Discharge Frequent or constant Supervision/Assistance  Patient can return home with the following Two people to help with walking and/or transfers;Two people to help with bathing/dressing/bathroom;Assistance with cooking/housework;Assistance with feeding    Functional  Status Assessment  Patient has had a recent decline in their functional status and demonstrates the ability to make significant improvements in function in a reasonable and predictable amount of time.  Equipment Recommendations  None recommended by OT       Precautions / Restrictions Precautions Precautions: Fall Restrictions Weight Bearing Restrictions: No      Mobility Bed Mobility Overal bed mobility: Needs Assistance Bed Mobility: Rolling Rolling: Total assist, +2 for physical assistance                     ADL either performed or assessed with clinical judgement   ADL Overall ADL's : Needs assistance/impaired Eating/Feeding: Minimal assistance;Bed level Eating/Feeding Details (indicate cue type and reason): if set up properly Grooming: Wash/dry face;Set up;Bed level   Upper Body Bathing: Maximal assistance;Bed level   Lower Body Bathing: Total assistance;Bed level   Upper Body Dressing : Total assistance;Bed level   Lower Body Dressing: Total assistance;Bed level                       Vision Patient Visual Report: No change from baseline              Pertinent Vitals/Pain Pain Assessment Pain Assessment: Faces Faces Pain Scale: Hurts a little bit Pain Location: LUE with PROM Pain Descriptors / Indicators: Sore, Discomfort Pain Intervention(s): Limited activity within patient's tolerance, Monitored during session, Repositioned     Hand Dominance Right   Extremity/Trunk Assessment Upper Extremity Assessment Upper Extremity Assessment: RUE deficits/detail;LUE deficits/detail RUE Deficits /  Details: WFL overall from wrist distally with with some limits in shoulder movements due to "broken shoulder" earlier this year when she was dropped out of hoyer lift at the facility, 5th digit with limited AROM at PIP RUE Coordination: decreased gross motor LUE Deficits / Details: Presents plegic (MS) and swollen (started 2 months ago per pt)--note bulge  just distal to elbow when arm is supinated--this was reported to attending MD Broadus John) per secure chat today(07/04/2022). Reports pain for PROM finger extension and elbow extension. Trace movement finger extension/flexion and elbow flexion. Rest PROM decreased ROM LUE Coordination: decreased fine motor;decreased gross motor           Communication Communication Communication: No difficulties   Cognition Arousal/Alertness: Awake/alert Behavior During Therapy: WFL for tasks assessed/performed Overall Cognitive Status: Within Functional Limits for tasks assessed                                                  Home Living Family/patient expects to be discharged to:: Skilled nursing facility                                        Prior Functioning/Environment Prior Level of Function : Needs assist             Mobility Comments: pt hoyer lifts OOB ADLs Comments: total care        OT Problem List: Decreased strength;Decreased range of motion;Impaired UE functional use;Impaired balance (sitting and/or standing);Obesity;Increased edema;Pain;Decreased coordination      OT Treatment/Interventions: Self-care/ADL training;Splinting;Therapeutic exercise;Therapeutic activities;DME and/or AE instruction;Patient/family education    OT Goals(Current goals can be found in the care plan section) Acute Rehab OT Goals Patient Stated Goal: to be able to feed self, have more use of Bil UEs, and do UBB OT Goal Formulation: With patient Time For Goal Achievement: 07/18/22 Potential to Achieve Goals: Good  OT Frequency: Min 2X/week       AM-PAC OT "6 Clicks" Daily Activity     Outcome Measure Help from another person eating meals?: A Little (if set up properly and has finger foods) Help from another person taking care of personal grooming?: A Lot Help from another person toileting, which includes using toliet, bedpan, or urinal?: Total Help from another  person bathing (including washing, rinsing, drying)?: A Lot Help from another person to put on and taking off regular upper body clothing?: Total Help from another person to put on and taking off regular lower body clothing?: Total 6 Click Score: 10   End of Session Equipment Utilized During Treatment: Oxygen (2 liters)  Activity Tolerance: Patient tolerated treatment well Patient left: in bed;with bed alarm set (in chair position)  OT Visit Diagnosis: Other abnormalities of gait and mobility (R26.89);Muscle weakness (generalized) (M62.81);Pain Pain - Right/Left: Left Pain - part of body: Arm                Time: 0350-0938 OT Time Calculation (min): 33 min Charges:  OT General Charges $OT Visit: 1 Visit OT Evaluation $OT Eval Moderate Complexity: 1 Mod OT Treatments $Self Care/Home Management : 8-22 mins  Golden Circle, OTR/L [facility] Re[facility] [place] [phone] Office [phone]    Almon Register 07/04/2022, 10:16 AM

## 2022-07-04 NOTE — Evaluation (Signed)
Clinical/Bedside Swallow Evaluation Patient Details  Name: Shannon Garrett MRN: [id number] Date of Birth: May 19, 1961  Today's Date: 07/04/2022 Time: SLP Start Time (ACUTE ONLY): 7353 SLP Stop Time (ACUTE ONLY): 1004 SLP Time Calculation (min) (ACUTE ONLY): 13 min  Past Medical History:  Past Medical History:  Diagnosis Date   Adrenal insufficiency ([place])    Anemia of chronic disease    Aortic atherosclerosis ([place])    COVID-19 05/10/2019   Per facility paperwork   Decubitus ulcer    stage II sacral decubitus ulcer.    Depression    DM2 (diabetes mellitus, type 2) ([place])    Glaucoma    Glaucoma    HFrEF (heart failure with reduced ejection fraction) ([place])    TTE 04/2012: EF 20-25 // TTE 08/2012: EF 50-55 // Cath 10/19/19: Normal Cors // TTE 05/20/20: EF 35-40, trivial MR, mild AI   Hypertension    MS (multiple sclerosis) ([place])    a. Dx'd late 20's. b. Tx with Novantrone, Tysabri, Copaxone previously.   NICM (nonischemic cardiomyopathy) ([place])    cath 10/2019: normal cors // probably due to multiple chemo-Tx agents used for MS   SVT (supraventricular tachycardia)    Past Surgical History:  Past Surgical History:  Procedure Laterality Date   ABLATION     uterine   BILIARY DILATION  02/15/2020   Procedure: BILIARY DILATION;  Surgeon: Rush Landmark Telford Nab., MD;  Location: [facility];  Service: Gastroenterology;;   CESAREAN SECTION     CHOLECYSTECTOMY N/A 07/20/2019   Procedure: DIAGNOSTIC LAPAROSCOPY;  Surgeon: [name], MD;  Location: [facility];  Service: General;  Laterality: N/A;   CHOLECYSTECTOMY N/A 02/12/2020   Procedure: LAPAROSCOPIC CHOLECYSTECTOMY;  Surgeon: Clovis Riley, MD;  Location: [facility];  Service: General;  Laterality: N/A;   ERCP N/A 02/15/2020   Procedure: ENDOSCOPIC RETROGRADE CHOLANGIOPANCREATOGRAPHY (ERCP);  Surgeon: Irving Copas., MD;  Location: [facility];  Service: Gastroenterology;  Laterality: N/A;   IR EXCHANGE BILIARY DRAIN  09/07/2019   IR  PERC CHOLECYSTOSTOMY  07/21/2019   LEFT HEART CATH AND CORONARY ANGIOGRAPHY N/A 10/29/2019   Procedure: LEFT HEART CATH AND CORONARY ANGIOGRAPHY;  Surgeon: Leonie Man, MD;  Location: [facility];  Service: Cardiovascular;  Laterality: N/A;   REMOVAL OF STONES  02/15/2020   Procedure: REMOVAL OF STONES;  Surgeon: Rush Landmark Telford Nab., MD;  Location: [facility];  Service: Gastroenterology;;   Joan Mayans  02/15/2020   Procedure: Joan Mayans;  Surgeon: Mansouraty, Telford Nab., MD;  Location: [facility];  Service: Gastroenterology;;   HPI:  62 yo female presents to ED on 12/31 from SNF with ShOB and lethargy requiring 15L in ED. Has had several clinical swallow evaluations in the past, the last documented was at [facility] November 2020, at which time a dysphagia 3 diet with thin liquids was recommended. PMH: MS (bedlevel), GERD, HTN, glaucoma, depression, cardiomyopathy, CHF, neurogenic bladder with recurrent UTIs.    Assessment / Plan / Recommendation  Clinical Impression  Pt participated in clinical swallow assessment.  Oral mechanism exam was normal; missing upper dentition. She states chewing meat is difficult. She demonstrated thorough mastication of crackers, the appearance of a brisk swallow response, no s/s of aspiration with any consistences. Recommend pt continue on current diet; will request chopped meats per her preference. No dysphagia identified. SLP service will sign off. SLP Visit Diagnosis: Dysphagia, unspecified (R13.10)    Aspiration Risk  No limitations    Diet Recommendation   Regular solids, thin liquids  Medication Administration:  Whole meds with liquid    Other  Recommendations Oral Care Recommendations: Oral care BID    Recommendations for follow up therapy are one component of a multi-disciplinary discharge planning process, led by the attending physician.  Recommendations may be updated based on patient status, additional functional criteria and  insurance authorization.  Follow up Recommendations No SLP follow up        Swallow Study   General Date of Onset: 07/02/22 HPI: 62 yo female presents to ED on 12/31 from SNF with ShOB and lethargy requiring 15L in ED. Has had several clinical swallow evaluations in the past, the last documented was at [facility] November 2020, at which time a dysphagia 3 diet with thin liquids was recommended. PMH: MS (bedlevel), GERD, HTN, glaucoma, depression, cardiomyopathy, CHF, neurogenic bladder with recurrent UTIs. Type of Study: Bedside Swallow Evaluation Previous Swallow Assessment: see HPI Diet Prior to this Study: Regular;Thin liquids Temperature Spikes Noted: No Respiratory Status: Nasal cannula History of Recent Intubation: No Behavior/Cognition: Alert;Cooperative Oral Cavity Assessment: Within Functional Limits Oral Care Completed by SLP: Recent completion by staff Oral Cavity - Dentition: Missing dentition Vision: Functional for self-feeding Self-Feeding Abilities: Needs assist;Able to feed self Patient Positioning: Upright in bed Baseline Vocal Quality: Normal Volitional Cough: Strong Volitional Swallow: Able to elicit    Oral/Motor/Sensory Function Overall Oral Motor/Sensory Function: Within functional limits   Ice Chips Ice chips: Within functional limits   Thin Liquid Thin Liquid: Within functional limits    Nectar Thick Nectar Thick Liquid: Not tested   Honey Thick Honey Thick Liquid: Not tested   Puree Puree: Within functional limits   Solid     Solid: Within functional limits      Juan Quam Laurice 07/04/2022,10:54 AM   Estill Bamberg L. Tivis Ringer, MA CCC/SLP Clinical Specialist - [place] Office number [phone]

## 2022-07-04 NOTE — Plan of Care (Signed)
  Problem: Education: Goal: Knowledge of General Education information will improve Description: Including pain rating scale, medication(s)/side effects and non-pharmacologic comfort measures Outcome: Progressing   Problem: Health Behavior/Discharge Planning: Goal: Ability to manage health-related needs will improve Outcome: Progressing   Problem: Clinical Measurements: Goal: Ability to maintain clinical measurements within normal limits will improve Outcome: Progressing Goal: Will remain free from infection Outcome: Progressing Goal: Diagnostic test results will improve Outcome: Progressing   Problem: Nutrition: Goal: Adequate nutrition will be maintained Outcome: Progressing   Problem: Coping: Goal: Level of anxiety will decrease Outcome: Progressing   Problem: Elimination: Goal: Will not experience complications related to bowel motility Outcome: Progressing Goal: Will not experience complications related to urinary retention Outcome: Progressing   Problem: Pain Managment: Goal: General experience of comfort will improve Outcome: Progressing   Problem: Clinical Measurements: Goal: Respiratory complications will improve Outcome: Not Progressing Goal: Cardiovascular complication will be avoided Outcome: Not Progressing   Problem: Activity: Goal: Risk for activity intolerance will decrease Outcome: Not Progressing

## 2022-07-04 NOTE — Progress Notes (Signed)
Orthopedic Tech Progress Note Patient Details:  Shannon Garrett [birth year] [id number]  Called in order to [facility] for RESTING HAND SPLINT   Patient ID: Shannon Garrett, female   DOB: March 12, 1961, 62 y.o.   MRN: [id number]  Shannon Garrett 07/04/2022, 1:43 PM

## 2022-07-04 NOTE — Progress Notes (Signed)
[facility] notified CSW that pt can return and that they have gotten insurance auth.  Beckey Rutter, MSW, LCSWA, LCASA Transitions of Care  Clinical Social Worker I

## 2022-07-04 NOTE — Progress Notes (Addendum)
PROGRESS NOTE    Belem Hintze  TMA:[id number] DOB: 05-28-1961 DOA: 07/01/2022 PCP: Pcp, No  62 y.o. female with history of chronic systolic CHF, EF 25% pulmonary hypertension, advanced multiple sclerosis, bed/wheelchair bound, SNF resident, essential hypertension, anemia of chronic disease, type 2 diabetes mellitus,  admitted to [facility] on 07/01/2022 with acute on chronic systolic heart failure after presented from snf to [facility] ED w/ shortness of breath.    -Echo with EF down to 20-25%, severely elevated PA pressures, RV could not be assessed  Subjective: -Feels congested, upper airway cough  Assessment and Plan:  Acute on chronic systolic CHF History of nonischemic cardiomyopathy -Echo previously with EF 35-40%, LHC 2021 with no CAD -Now EF down to 20-25% global hypokinesis, normal RV function, severely elevated PA pressures -Diuresing on IV Lasix, she is 6.4 L negative, agree with continuing diuretics today -Continue Aldactone, losartan, Toprol -Poor candidate for SGLT 2i with advanced MS and bedbound state, suprapubic catheter -Monitor urine output, BMP in a.m.  Acute hypoxic respiratory failure -Secondary to above, wean off O2 as tolerated -Add nebs, pulmonary toilet, suction PRN  Neurogenic bladder, suprapubic catheter -Suprapubic catheter exchanged 1/2 -Some dysuria reported to previous MD by patient 2 days ago, UA pending -Clinically do not suspect infection at this time, no fever or leukocytosis, monitor clinically  Advanced multiple sclerosis Polypharmacy with increased daytime drowsiness -Followed by Dr. Richardean Chimera, she is bed/wheelchair bound, SNF resident -Continue amantadine, baclofen -Decrease gabapentin dose for excess daytime drowsiness  Type 2 diabetes mellitus -CBGs are stable, A1c is 5.8, sliding scale insulin  Chronic pain -Continue Cymbalta, tramadol, decreased gabapentin dose  Anxiety/depression -Continue Cymbalta,  Wellbutrin  Hypomagnesemia -Will replace   DVT prophylaxis: Add Lovenox  Code Status: DNR Family Communication: None present Disposition Plan: SNF in 48hours  Consultants: Cards   Procedures:   Antimicrobials:    Objective: Vitals:   07/04/22 0420 07/04/22 0421 07/04/22 0711 07/04/22 0817  BP: (!) 102/50  (!) 95/51 116/68  Pulse:   79 88  Resp:   18   Temp: 98.7 F (37.1 C)  98.1 F (36.7 C)   TempSrc: Oral  Oral   SpO2:   100%   Weight:  102.1 kg    Height:        Intake/Output Summary (Last 24 hours) at 07/04/2022 1200 Last data filed at 07/04/2022 0800 Gross per 24 hour  Intake 832 ml  Output 2175 ml  Net -1343 ml   Filed Weights   07/01/22 1618 07/03/22 0428 07/04/22 0421  Weight: 99.8 kg 102 kg 102.1 kg    Examination:  General exam: Obese chronically ill female laying up in bed, somnolent but easily arousable, oriented to self place and partly to time HEENT: Neck obese unable to assess JVD CVS: S1-S2, regular rhythm Lungs: Scattered rhonchi, decreased at the bases Abdomen: Soft, nontender, bowel sounds present Extremities: 1+ edema, both lower legs and Profore boots  Psychiatry: Flat affect  Data Reviewed:   CBC: Recent Labs  Lab 07/01/22 1616 07/01/22 1926 07/02/22 0500 07/03/22 0056 07/04/22 0037  WBC 6.7  --  6.8 5.6 5.8  NEUTROABS 3.9  --  3.5  --   --   HGB 10.8* 11.2* 10.9* 10.9* 10.8*  HCT 33.0* 33.0* 33.8* 33.1* 34.0*  MCV 87.8  --  87.3 85.3 87.0  PLT 288  --  269 249 638   Basic Metabolic Panel: Recent Labs  Lab 07/01/22 1616 07/01/22 1817 07/01/22 1926 07/02/22 0500 07/03/22  0056 07/04/22 0037  NA 145  --  147* 144 144 138  K 4.1  --  4.0 3.5 3.4* 3.7  CL 108  --   --  108 101 98  CO2 32  --   --  30 32 28  GLUCOSE 110*  --   --  122* 114* 143*  BUN 10  --   --  '10 10 9  '$ CREATININE 0.87  --   --  0.80 0.92 0.67  CALCIUM 8.4*  --   --  8.4* 8.5* 8.3*  MG  --  1.8  --  1.7 1.7 1.6*  PHOS  --   --   --  3.5 3.0 2.3*    GFR: Estimated Creatinine Clearance: 89.6 mL/min (by C-G formula based on SCr of 0.67 mg/dL). Liver Function Tests: Recent Labs  Lab 07/01/22 1616 07/02/22 0500 07/03/22 0056  AST '17 16 16  '$ ALT '17 17 15  '$ ALKPHOS 126 128* 137*  BILITOT 0.5 0.4 0.4  PROT 5.7* 5.9* 5.8*  ALBUMIN 2.8* 2.9* 2.9*   No results for input(s): "LIPASE", "AMYLASE" in the last 168 hours. No results for input(s): "AMMONIA" in the last 168 hours. Coagulation Profile: No results for input(s): "INR", "PROTIME" in the last 168 hours. Cardiac Enzymes: No results for input(s): "CKTOTAL", "CKMB", "CKMBINDEX", "TROPONINI" in the last 168 hours. BNP (last 3 results) No results for input(s): "PROBNP" in the last 8760 hours. HbA1C: Recent Labs    07/01/22 1617  HGBA1C 5.8*   CBG: Recent Labs  Lab 07/03/22 1106 07/03/22 1622 07/03/22 2111 07/04/22 0617 07/04/22 1111  GLUCAP 113* 87 190* 102* 129*   Lipid Profile: No results for input(s): "CHOL", "HDL", "LDLCALC", "TRIG", "CHOLHDL", "LDLDIRECT" in the last 72 hours. Thyroid Function Tests: No results for input(s): "TSH", "T4TOTAL", "FREET4", "T3FREE", "THYROIDAB" in the last 72 hours. Anemia Panel: No results for input(s): "VITAMINB12", "FOLATE", "FERRITIN", "TIBC", "IRON", "RETICCTPCT" in the last 72 hours. Urine analysis:    Component Value Date/Time   COLORURINE YELLOW 07/03/2022 1353   APPEARANCEUR HAZY (A) 07/03/2022 1353   LABSPEC 1.009 07/03/2022 1353   PHURINE 7.0 07/03/2022 1353   GLUCOSEU NEGATIVE 07/03/2022 1353   HGBUR MODERATE (A) 07/03/2022 1353   BILIRUBINUR NEGATIVE 07/03/2022 1353   KETONESUR NEGATIVE 07/03/2022 1353   PROTEINUR NEGATIVE 07/03/2022 1353   UROBILINOGEN 1.0 01/10/2015 2311   NITRITE NEGATIVE 07/03/2022 1353   LEUKOCYTESUR LARGE (A) 07/03/2022 1353   Sepsis Labs: '@LABRCNTIP'$ (procalcitonin:4,lacticidven:4)  ) Recent Results (from the past 240 hour(s))  Resp panel by RT-PCR (RSV, Flu A&B, Covid) Anterior Nasal  Swab     Status: None   Collection Time: 07/02/22  7:40 PM   Specimen: Anterior Nasal Swab  Result Value Ref Range Status   SARS Coronavirus 2 by RT PCR NEGATIVE NEGATIVE Final    Comment: (NOTE) SARS-CoV-2 target nucleic acids are NOT DETECTED.  The SARS-CoV-2 RNA is generally detectable in upper respiratory specimens during the acute phase of infection. The lowest concentration of SARS-CoV-2 viral copies this assay can detect is 138 copies/mL. A negative result does not preclude SARS-Cov-2 infection and should not be used as the sole basis for treatment or other patient management decisions. A negative result may occur with  improper specimen collection/handling, submission of specimen other than nasopharyngeal swab, presence of viral mutation(s) within the areas targeted by this assay, and inadequate number of viral copies(<138 copies/mL). A negative result must be combined with clinical observations, patient history, and epidemiological information. The expected  result is Negative.  Fact Sheet for Patients:  EntrepreneurPulse.com.au  Fact Sheet for Healthcare Providers:  IncredibleEmployment.be  This test is no t yet approved or cleared by the [place] FDA and  has been authorized for detection and/or diagnosis of SARS-CoV-2 by FDA under an Emergency Use Authorization (EUA). This EUA will remain  in effect (meaning this test can be used) for the duration of the COVID-19 declaration under Section 564(b)(1) of the Act, 21 U.S.C.section 360bbb-3(b)(1), unless the authorization is terminated  or revoked sooner.       Influenza A by PCR NEGATIVE NEGATIVE Final   Influenza B by PCR NEGATIVE NEGATIVE Final    Comment: (NOTE) The Xpert Xpress SARS-CoV-2/FLU/RSV plus assay is intended as an aid in the diagnosis of influenza from Nasopharyngeal swab specimens and should not be used as a sole basis for treatment. Nasal washings and aspirates  are unacceptable for Xpert Xpress SARS-CoV-2/FLU/RSV testing.  Fact Sheet for Patients: EntrepreneurPulse.com.au  Fact Sheet for Healthcare Providers: IncredibleEmployment.be  This test is not yet approved or cleared by the [place] FDA and has been authorized for detection and/or diagnosis of SARS-CoV-2 by FDA under an Emergency Use Authorization (EUA). This EUA will remain in effect (meaning this test can be used) for the duration of the COVID-19 declaration under Section 564(b)(1) of the Act, 21 U.S.C. section 360bbb-3(b)(1), unless the authorization is terminated or revoked.     Resp Syncytial Virus by PCR NEGATIVE NEGATIVE Final    Comment: (NOTE) Fact Sheet for Patients: EntrepreneurPulse.com.au  Fact Sheet for Healthcare Providers: IncredibleEmployment.be  This test is not yet approved or cleared by the [place] FDA and has been authorized for detection and/or diagnosis of SARS-CoV-2 by FDA under an Emergency Use Authorization (EUA). This EUA will remain in effect (meaning this test can be used) for the duration of the COVID-19 declaration under Section 564(b)(1) of the Act, 21 U.S.C. section 360bbb-3(b)(1), unless the authorization is terminated or revoked.  Performed at [facility], [place] [address] [place],  [address]      Radiology Studies: MR BRAIN WO CONTRAST  Result Date: 07/03/2022 CLINICAL DATA:  Multiple sclerosis.  Right upper extremity weakness. EXAM: MRI HEAD WITHOUT CONTRAST TECHNIQUE: Multiplanar, multiecho pulse sequences of the brain and surrounding structures were obtained without intravenous contrast. COMPARISON:  Brain MRI 07/11/2015 FINDINGS: Brain: No acute infarction, hemorrhage, hydrocephalus, extra-axial collection or mass lesion. There are numerous white matter lesions in unchanged distribution since 07/11/2015. There are no new lesions. No chronic  microhemorrhage. Unchanged brain volume. Vascular: Normal flow voids. Skull and upper cervical spine: Normal marrow signal. Sinuses/Orbits: Negative. Other: None. IMPRESSION: Unchanged distribution of numerous white matter lesions in a pattern consistent with multiple sclerosis. No new lesions. No acute abnormality. Electronically Signed   By: Ulyses Jarred M.D.   On: 07/03/2022 00:18     Scheduled Meds:  amantadine  100 mg Oral Daily   aspirin EC  325 mg Oral Daily   baclofen  10 mg Oral TID   buPROPion  150 mg Oral Daily   dextromethorphan-guaiFENesin  1 tablet Oral BID   docusate sodium  100 mg Oral BID   DULoxetine  60 mg Oral Daily   ferrous sulfate  325 mg Oral BID WC   fluticasone  1 spray Each Nare QHS   furosemide  40 mg Intravenous BID   gabapentin  800 mg Oral TID   insulin aspart  0-9 Units Subcutaneous TID WC   ipratropium-albuterol  3 mL Nebulization TID   latanoprost  1 drop Both Eyes QHS   linaclotide  290 mcg Oral QAC breakfast   losartan  12.5 mg Oral Daily   magnesium oxide  400 mg Oral Daily   metoprolol succinate  75 mg Oral Daily   potassium chloride SA  20 mEq Oral Daily   potassium chloride  40 mEq Oral Once   senna-docusate  2 tablet Oral BID   simvastatin  10 mg Oral QHS   spironolactone  25 mg Oral Daily   Continuous Infusions:   LOS: 3 days    Time spent: 22mn  PDomenic Polite MD [facility]   07/04/2022, 12:00 PM

## 2022-07-05 DIAGNOSIS — I5043 Acute on chronic combined systolic (congestive) and diastolic (congestive) heart failure: Secondary | ICD-10-CM | POA: Diagnosis not present

## 2022-07-05 LAB — BASIC METABOLIC PANEL
Anion gap: 9 (ref 5–15)
BUN: 7 mg/dL (ref 6–20)
CO2: 33 mmol/L — ABNORMAL HIGH (ref 22–32)
Calcium: 8.4 mg/dL — ABNORMAL LOW (ref 8.9–10.3)
Chloride: 100 mmol/L (ref 98–111)
Creatinine, Ser: 0.76 mg/dL (ref 0.44–1.00)
GFR, Estimated: >60 mL/min (ref >60)
Glucose, Bld: 79 mg/dL (ref 70–99)
Potassium: 4.2 mmol/L (ref 3.5–5.1)
Sodium: 142 mmol/L (ref 135–145)

## 2022-07-05 LAB — GLUCOSE, CAPILLARY
Glucose-Capillary: 100 mg/dL — ABNORMAL HIGH (ref 70–99)
Glucose-Capillary: 175 mg/dL — ABNORMAL HIGH (ref 70–99)
Glucose-Capillary: 94 mg/dL (ref 70–99)
Glucose-Capillary: 133 mg/dL — ABNORMAL HIGH (ref 70–99)

## 2022-07-05 MED ORDER — ASPIRIN 81 MG PO TBEC
81.0000 mg | DELAYED_RELEASE_TABLET | Freq: Every day | ORAL | Status: DC
  Administered 2022-07-06: 81 mg via ORAL
  Filled 2022-07-05: qty 1

## 2022-07-05 NOTE — Progress Notes (Signed)
Occupational Therapy Treatment Patient Details Name: Shannon Garrett MRN: [id number] DOB: [birth year] Today's Date: 07/05/2022   History of present illness 62 yo female presents to ED on 12/31 from SNF with SOB and lethargy requiring 15L in ED. PMH: MS (bedlevel), GERD, HTN, glaucoma, depression, cardiomyopathy, CHF, neurogenic bladder with recurrent UTIs   OT comments  L resting hand splint in room. Adjusted fit and instructed pt in donning and doffing so she can direct caregivers. Pt reports comfort, but when splint removed after 15 minutes, straps left significant indentations in pt's skin, removed. Pt continues to have excessive fluid. Educated pt in splint care and provided written handout for SNF staff. RN notified to hold splint use for now.    Recommendations for follow up therapy are one component of a multi-disciplinary discharge planning process, led by the attending physician.  Recommendations may be updated based on patient status, additional functional criteria and insurance authorization.    Follow Up Recommendations  Skilled nursing-short term rehab (<3 hours/day)     Assistance Recommended at Discharge Frequent or constant Supervision/Assistance  Patient can return home with the following  Two people to help with walking and/or transfers;Two people to help with bathing/dressing/bathroom;Assistance with cooking/housework;Assistance with feeding   Equipment Recommendations  None recommended by OT    Recommendations for Other Services      Precautions / Restrictions Precautions Precautions: Fall Restrictions Weight Bearing Restrictions: No       Mobility Bed Mobility               General bed mobility comments: total assist to reposition    Transfers                         Balance                                           ADL either performed or assessed with clinical judgement   ADL                                               Extremity/Trunk Assessment Upper Extremity Assessment LUE Deficits / Details: performed PROM all areas            Vision       Perception     Praxis      Cognition Arousal/Alertness: Awake/alert Behavior During Therapy: WFL for tasks assessed/performed Overall Cognitive Status: History of cognitive impairments - at baseline                                 General Comments: likely baseline        Exercises      Shoulder Instructions       General Comments prefab L resting hand splint adjusted and fitted to patient, but with her edema, did not leave it on pt as straps were leaving indentations, provided written care instructions, but did not establish a wearing schedule    Pertinent Vitals/ Pain       Pain Assessment Pain Assessment: No/denies pain  Home Living  Prior Functioning/Environment              Frequency  Min 2X/week        Progress Toward Goals  OT Goals(current goals can now be found in the care plan section)  Progress towards OT goals: Progressing toward goals  Acute Rehab OT Goals OT Goal Formulation: With patient Time For Goal Achievement: 07/18/22 Potential to Achieve Goals: Good  Plan Discharge plan remains appropriate    Co-evaluation                 AM-PAC OT "6 Clicks" Daily Activity     Outcome Measure   Help from another person eating meals?: A Little Help from another person taking care of personal grooming?: A Lot Help from another person toileting, which includes using toliet, bedpan, or urinal?: Total Help from another person bathing (including washing, rinsing, drying)?: A Lot Help from another person to put on and taking off regular upper body clothing?: Total Help from another person to put on and taking off regular lower body clothing?: Total 6 Click Score: 10    End of Session    OT Visit Diagnosis:  Muscle weakness (generalized) (M62.81);Other symptoms and signs involving cognitive function   Activity Tolerance Patient tolerated treatment well   Patient Left in bed;with bed alarm set;with nursing/sitter in room   Nurse Communication Other (comment) (aware resting hand splint in room, but not to use until edema decreases)        Time: 6950-7225 OT Time Calculation (min): 23 min  Charges: OT General Charges $OT Visit: 1 Visit OT Treatments $Therapeutic Activity: 23-37 mins  Cleta Alberts, OTR/L Acute Rehabilitation Services Office: [phone]   Malka So 07/05/2022, 9:32 AM

## 2022-07-05 NOTE — Progress Notes (Signed)
PROGRESS NOTE    Shannon Garrett  XVQ:[id number] DOB: [birth year] DOA: 07/01/2022 PCP: Pcp, No  62 y.o. female with history of chronic systolic CHF, EF 09% pulmonary hypertension, advanced multiple sclerosis, bed/wheelchair bound, SNF resident, essential hypertension, anemia of chronic disease, type 2 diabetes mellitus,  admitted to [facility] on 07/01/2022 with acute on chronic systolic heart failure after presented from snf to [facility] ED w/ shortness of breath.    -Echo with EF down to 20-25%, severely elevated PA pressures, RV could not be assessed  Subjective: -Feels better overall, breathing is improving, swelling going down, still has some cough  Assessment and Plan:  Acute on chronic systolic CHF History of nonischemic cardiomyopathy -Echo previously with EF 35-40%, LHC 2021 with no CAD -Now EF down to 20-25% global hypokinesis, normal RV function, severely elevated PA pressures -Improving on diuretics, she is 8.8 L negative, continue IV Lasix 1 more day -Continue Aldactone, losartan, Toprol -Poor candidate for SGLT 2i with advanced MS and bedbound state, suprapubic catheter -BMP in a.m. -Discharge planning, back to SNF soon, would benefit from palliative care eval at facility, very poor functional status  Acute hypoxic respiratory failure -Secondary to above, wean off O2 as tolerated -Continue nebs, pulmonary toilet, suction PRN  Neurogenic bladder, suprapubic catheter -Suprapubic catheter exchanged 1/2 -Some dysuria reported to previous MD by patient 2 days ago, UA pending -Clinically do not suspect infection at this time, no fever or leukocytosis, monitor clinically  Advanced multiple sclerosis Polypharmacy with increased daytime drowsiness -Followed by Dr. Richardean Chimera, she is bed/wheelchair bound, SNF resident -Continue amantadine, baclofen -Decreased gabapentin dose for excess daytime drowsiness  Type 2 diabetes mellitus -CBGs are stable, A1c is 5.8, sliding  scale insulin  Chronic pain -Continue Cymbalta, tramadol, decreased gabapentin dose  Anxiety/depression -Continue Cymbalta, Wellbutrin  Hypomagnesemia -Replaced   DVT prophylaxis:  Lovenox  Code Status: DNR Family Communication: None present Disposition Plan: SNF in 1 to 2 days  Consultants: Cards   Procedures:   Antimicrobials:    Objective: Vitals:   07/05/22 0008 07/05/22 0526 07/05/22 0725 07/05/22 1046  BP: 105/69 110/63 124/75 115/66  Pulse:  82 87 82  Resp:  (!) '22 20 14  '$ Temp: 98 F (36.7 C) 98.7 F (37.1 C) 98.4 F (36.9 C)   TempSrc: Oral Oral Oral   SpO2:  98% 96% 92%  Weight:  102.1 kg    Height:        Intake/Output Summary (Last 24 hours) at 07/05/2022 1216 Last data filed at 07/05/2022 1146 Gross per 24 hour  Intake 1480 ml  Output 3300 ml  Net -1820 ml   Filed Weights   07/03/22 0428 07/04/22 0421 07/05/22 0526  Weight: 102 kg 102.1 kg 102.1 kg    Examination:  General exam: Obese chronically ill female sitting up in bed, oriented x3 HEENT: Neck obese unable to assess JVD CVs: S1-S2, regular rhythm Lungs: Scattered rhonchi, decreased breath sounds to bases Abdomen: Soft, nontender, bowel sounds present Extremities: 1+ edema, PRAFO boots on Psychiatry: Flat affect  Data Reviewed:   CBC: Recent Labs  Lab 07/01/22 1616 07/01/22 1926 07/02/22 0500 07/03/22 0056 07/04/22 0037  WBC 6.7  --  6.8 5.6 5.8  NEUTROABS 3.9  --  3.5  --   --   HGB 10.8* 11.2* 10.9* 10.9* 10.8*  HCT 33.0* 33.0* 33.8* 33.1* 34.0*  MCV 87.8  --  87.3 85.3 87.0  PLT 288  --  269 249 326   Basic Metabolic  Panel: Recent Labs  Lab 07/01/22 1616 07/01/22 1817 07/01/22 1926 07/02/22 0500 07/03/22 0056 07/04/22 0037 07/05/22 0042  NA 145  --  147* 144 144 138 142  K 4.1  --  4.0 3.5 3.4* 3.7 4.2  CL 108  --   --  108 101 98 100  CO2 32  --   --  30 32 28 33*  GLUCOSE 110*  --   --  122* 114* 143* 79  BUN 10  --   --  '10 10 9 7  '$ CREATININE 0.87  --    --  0.80 0.92 0.67 0.76  CALCIUM 8.4*  --   --  8.4* 8.5* 8.3* 8.4*  MG  --  1.8  --  1.7 1.7 1.6*  --   PHOS  --   --   --  3.5 3.0 2.3*  --    GFR: Estimated Creatinine Clearance: 89.6 mL/min (by C-G formula based on SCr of 0.76 mg/dL). Liver Function Tests: Recent Labs  Lab 07/01/22 1616 07/02/22 0500 07/03/22 0056  AST '17 16 16  '$ ALT '17 17 15  '$ ALKPHOS 126 128* 137*  BILITOT 0.5 0.4 0.4  PROT 5.7* 5.9* 5.8*  ALBUMIN 2.8* 2.9* 2.9*   No results for input(s): "LIPASE", "AMYLASE" in the last 168 hours. No results for input(s): "AMMONIA" in the last 168 hours. Coagulation Profile: No results for input(s): "INR", "PROTIME" in the last 168 hours. Cardiac Enzymes: No results for input(s): "CKTOTAL", "CKMB", "CKMBINDEX", "TROPONINI" in the last 168 hours. BNP (last 3 results) No results for input(s): "PROBNP" in the last 8760 hours. HbA1C: No results for input(s): "HGBA1C" in the last 72 hours.  CBG: Recent Labs  Lab 07/04/22 1111 07/04/22 1551 07/04/22 2059 07/05/22 0602 07/05/22 1044  GLUCAP 129* 127* 117* 100* 175*   Lipid Profile: No results for input(s): "CHOL", "HDL", "LDLCALC", "TRIG", "CHOLHDL", "LDLDIRECT" in the last 72 hours. Thyroid Function Tests: No results for input(s): "TSH", "T4TOTAL", "FREET4", "T3FREE", "THYROIDAB" in the last 72 hours. Anemia Panel: No results for input(s): "VITAMINB12", "FOLATE", "FERRITIN", "TIBC", "IRON", "RETICCTPCT" in the last 72 hours. Urine analysis:    Component Value Date/Time   COLORURINE YELLOW 07/03/2022 1353   APPEARANCEUR HAZY (A) 07/03/2022 1353   LABSPEC 1.009 07/03/2022 1353   PHURINE 7.0 07/03/2022 1353   GLUCOSEU NEGATIVE 07/03/2022 1353   HGBUR MODERATE (A) 07/03/2022 1353   BILIRUBINUR NEGATIVE 07/03/2022 1353   KETONESUR NEGATIVE 07/03/2022 1353   PROTEINUR NEGATIVE 07/03/2022 1353   UROBILINOGEN 1.0 01/10/2015 2311   NITRITE NEGATIVE 07/03/2022 1353   LEUKOCYTESUR LARGE (A) 07/03/2022 1353   Sepsis  Labs: '@LABRCNTIP'$ (procalcitonin:4,lacticidven:4)  ) Recent Results (from the past 240 hour(s))  Resp panel by RT-PCR (RSV, Flu A&B, Covid) Anterior Nasal Swab     Status: None   Collection Time: 07/02/22  7:40 PM   Specimen: Anterior Nasal Swab  Result Value Ref Range Status   SARS Coronavirus 2 by RT PCR NEGATIVE NEGATIVE Final    Comment: (NOTE) SARS-CoV-2 target nucleic acids are NOT DETECTED.  The SARS-CoV-2 RNA is generally detectable in upper respiratory specimens during the acute phase of infection. The lowest concentration of SARS-CoV-2 viral copies this assay can detect is 138 copies/mL. A negative result does not preclude SARS-Cov-2 infection and should not be used as the sole basis for treatment or other patient management decisions. A negative result may occur with  improper specimen collection/handling, submission of specimen other than nasopharyngeal swab, presence of viral mutation(s) within  the areas targeted by this assay, and inadequate number of viral copies(<138 copies/mL). A negative result must be combined with clinical observations, patient history, and epidemiological information. The expected result is Negative.  Fact Sheet for Patients:  EntrepreneurPulse.com.au  Fact Sheet for Healthcare Providers:  IncredibleEmployment.be  This test is no t yet approved or cleared by the [place] FDA and  has been authorized for detection and/or diagnosis of SARS-CoV-2 by FDA under an Emergency Use Authorization (EUA). This EUA will remain  in effect (meaning this test can be used) for the duration of the COVID-19 declaration under Section 564(b)(1) of the Act, 21 U.S.C.section 360bbb-3(b)(1), unless the authorization is terminated  or revoked sooner.       Influenza A by PCR NEGATIVE NEGATIVE Final   Influenza B by PCR NEGATIVE NEGATIVE Final    Comment: (NOTE) The Xpert Xpress SARS-CoV-2/FLU/RSV plus assay is intended as  an aid in the diagnosis of influenza from Nasopharyngeal swab specimens and should not be used as a sole basis for treatment. Nasal washings and aspirates are unacceptable for Xpert Xpress SARS-CoV-2/FLU/RSV testing.  Fact Sheet for Patients: EntrepreneurPulse.com.au  Fact Sheet for Healthcare Providers: IncredibleEmployment.be  This test is not yet approved or cleared by the [place] FDA and has been authorized for detection and/or diagnosis of SARS-CoV-2 by FDA under an Emergency Use Authorization (EUA). This EUA will remain in effect (meaning this test can be used) for the duration of the COVID-19 declaration under Section 564(b)(1) of the Act, 21 U.S.C. section 360bbb-3(b)(1), unless the authorization is terminated or revoked.     Resp Syncytial Virus by PCR NEGATIVE NEGATIVE Final    Comment: (NOTE) Fact Sheet for Patients: EntrepreneurPulse.com.au  Fact Sheet for Healthcare Providers: IncredibleEmployment.be  This test is not yet approved or cleared by the [place] FDA and has been authorized for detection and/or diagnosis of SARS-CoV-2 by FDA under an Emergency Use Authorization (EUA). This EUA will remain in effect (meaning this test can be used) for the duration of the COVID-19 declaration under Section 564(b)(1) of the Act, 21 U.S.C. section 360bbb-3(b)(1), unless the authorization is terminated or revoked.  Performed at [facility], [place] [address], [place], [place] [address]      Radiology Studies: No results found.   Scheduled Meds:  amantadine  100 mg Oral Daily   [START ON 07/06/2022] aspirin EC  81 mg Oral Daily   baclofen  10 mg Oral TID   buPROPion  150 mg Oral Daily   dextromethorphan-guaiFENesin  1 tablet Oral BID   docusate sodium  100 mg Oral BID   DULoxetine  60 mg Oral Daily   enoxaparin (LOVENOX) injection  40 mg Subcutaneous Q24H   ferrous sulfate  325  mg Oral BID WC   fluticasone  1 spray Each Nare QHS   furosemide  40 mg Intravenous BID   gabapentin  600 mg Oral TID   insulin aspart  0-9 Units Subcutaneous TID WC   ipratropium-albuterol  3 mL Nebulization TID   latanoprost  1 drop Both Eyes QHS   linaclotide  290 mcg Oral QAC breakfast   losartan  12.5 mg Oral Daily   magnesium oxide  400 mg Oral Daily   metoprolol succinate  75 mg Oral Daily   potassium chloride SA  20 mEq Oral Daily   senna-docusate  2 tablet Oral BID   simvastatin  10 mg Oral QHS   spironolactone  25 mg Oral Daily   Continuous  Infusions:   LOS: 4 days    Time spent: 55mn  PDomenic Polite MD [facility]   07/05/2022, 12:16 PM

## 2022-07-05 NOTE — Progress Notes (Addendum)
Rounding Note    Patient Name: Shannon Garrett Date of Encounter: 07/05/2022  [place] Cardiologist: Peter Martinique, MD   Subjective   Patient continues to endorse improved respiratory status. Denies chest pain or palpitations this morning. She expresses concern this morning that she has sleep apnea, says that she chronically wakes up feeling short of breath and says that she was previously scheduled to have a sleep study.   Inpatient Medications    Scheduled Meds:  amantadine  100 mg Oral Daily   aspirin EC  325 mg Oral Daily   baclofen  10 mg Oral TID   buPROPion  150 mg Oral Daily   dextromethorphan-guaiFENesin  1 tablet Oral BID   docusate sodium  100 mg Oral BID   DULoxetine  60 mg Oral Daily   enoxaparin (LOVENOX) injection  40 mg Subcutaneous Q24H   ferrous sulfate  325 mg Oral BID WC   fluticasone  1 spray Each Nare QHS   furosemide  40 mg Intravenous BID   gabapentin  600 mg Oral TID   insulin aspart  0-9 Units Subcutaneous TID WC   ipratropium-albuterol  3 mL Nebulization TID   latanoprost  1 drop Both Eyes QHS   linaclotide  290 mcg Oral QAC breakfast   losartan  12.5 mg Oral Daily   magnesium oxide  400 mg Oral Daily   metoprolol succinate  75 mg Oral Daily   potassium chloride SA  20 mEq Oral Daily   senna-docusate  2 tablet Oral BID   simvastatin  10 mg Oral QHS   spironolactone  25 mg Oral Daily   Continuous Infusions:  PRN Meds: acetaminophen **OR** acetaminophen, albuterol, melatonin, polyethylene glycol, traMADol   Vital Signs    Vitals:   07/04/22 1951 07/05/22 0008 07/05/22 0526 07/05/22 0725  BP: (!) 103/57 105/69 110/63 124/75  Pulse:   82 87  Resp:   (!) 22 20  Temp: 98.8 F (37.1 C) 98 F (36.7 C) 98.7 F (37.1 C) 98.4 F (36.9 C)  TempSrc: Oral Oral Oral Oral  SpO2:   98% 96%  Weight:   102.1 kg   Height:        Intake/Output Summary (Last 24 hours) at 07/05/2022 0934 Last data filed at 07/05/2022 0800 Gross per 24 hour   Intake 1480 ml  Output 3335 ml  Net -1855 ml      07/05/2022    5:26 AM 07/04/2022    4:21 AM 07/03/2022    4:28 AM  Last 3 Weights  Weight (lbs) 225 lb 1.4 oz 225 lb 1.4 oz 224 lb 13.9 oz  Weight (kg) 102.1 kg 102.1 kg 102 kg      Telemetry   Sinus rhythm  ECG    No new tracing  Physical Exam   GEN: No acute distress.   Neck: No JVD Cardiac: RRR, no murmurs, rubs, or gallops.  Respiratory: Clear to auscultation bilaterally. GI: Soft, nontender, non-distended  MS: 2+ bilateral lower extremity edema. No deformity. Chronically swollen LUE. Neuro:  Nonfocal  Psych: Normal affect   Labs    High Sensitivity Troponin:   Recent Labs  Lab 07/01/22 1616 07/01/22 1817 07/02/22 0500  TROPONINIHS 18* 19* 20*     Chemistry Recent Labs  Lab 07/01/22 1616 07/01/22 1817 07/02/22 0500 07/03/22 0056 07/04/22 0037 07/05/22 0042  NA 145   < > 144 144 138 142  K 4.1   < > 3.5 3.4* 3.7 4.2  CL  108  --  108 101 98 100  CO2 32  --  30 32 28 33*  GLUCOSE 110*  --  122* 114* 143* 79  BUN 10  --  '10 10 9 7  '$ CREATININE 0.87  --  0.80 0.92 0.67 0.76  CALCIUM 8.4*  --  8.4* 8.5* 8.3* 8.4*  MG  --    < > 1.7 1.7 1.6*  --   PROT 5.7*  --  5.9* 5.8*  --   --   ALBUMIN 2.8*  --  2.9* 2.9*  --   --   AST 17  --  16 16  --   --   ALT 17  --  17 15  --   --   ALKPHOS 126  --  128* 137*  --   --   BILITOT 0.5  --  0.4 0.4  --   --   GFRNONAA >60  --  >60 >60 >60 >60  ANIONGAP 5  --  '6 11 12 9   '$ < > = values in this interval not displayed.    Lipids No results for input(s): "CHOL", "TRIG", "HDL", "LABVLDL", "LDLCALC", "CHOLHDL" in the last 168 hours.  Hematology Recent Labs  Lab 07/02/22 0500 07/03/22 0056 07/04/22 0037  WBC 6.8 5.6 5.8  RBC 3.87 3.88 3.91  HGB 10.9* 10.9* 10.8*  HCT 33.8* 33.1* 34.0*  MCV 87.3 85.3 87.0  MCH 28.2 28.1 27.6  MCHC 32.2 32.9 31.8  RDW 16.2* 16.3* 16.3*  PLT 269 249 249   Thyroid No results for input(s): "TSH", "FREET4" in the last 168  hours.  BNP Recent Labs  Lab 07/01/22 1617  BNP 3,102.7*    DDimer No results for input(s): "DDIMER" in the last 168 hours.   Radiology    No results found.  Cardiac Studies   07/02/22 TTE   IMPRESSIONS     1. Left ventricular ejection fraction, by estimation, is 20 to 25%. The  left ventricle has severely decreased function. The left ventricle  demonstrates global hypokinesis. The left ventricular internal cavity size  was severely dilated. There is mild  left ventricular hypertrophy. Left ventricular diastolic parameters are  indeterminate.   2. Right ventricular systolic function was not well visualized. The right  ventricular size is not well visualized. Poor RV visualization but appears  grossly normal size and systolic function. There is severely elevated  pulmonary artery systolic  pressure. The estimated right ventricular systolic pressure is 54.6 mmHg.   3. Left atrial size was moderately dilated.   4. The mitral valve is abnormal. Moderate to severe mitral valve  regurgitation. Appears functional   5. Tricuspid valve regurgitation is moderate.   6. The aortic valve was not well visualized. Aortic valve regurgitation  is trivial. No aortic stenosis is present.   7. The inferior vena cava is dilated in size with <50% respiratory  variability, suggesting right atrial pressure of 15 mmHg.   FINDINGS   Left Ventricle: Left ventricular ejection fraction, by estimation, is 20  to 25%. The left ventricle has severely decreased function. The left  ventricle demonstrates global hypokinesis. The left ventricular internal  cavity size was severely dilated.  There is mild left ventricular hypertrophy. Left ventricular diastolic  parameters are indeterminate.   Right Ventricle: The right ventricular size is not well visualized. Right  vetricular wall thickness was not well visualized. Right ventricular  systolic function was not well visualized. There is severely elevated   pulmonary artery  systolic pressure. The  tricuspid regurgitant velocity is 3.96 m/s, and with an assumed right  atrial pressure of 15 mmHg, the estimated right ventricular systolic  pressure is 28.7 mmHg.   Left Atrium: Left atrial size was moderately dilated.   Right Atrium: Right atrial size was normal in size.   Pericardium: There is no evidence of pericardial effusion.   Mitral Valve: The mitral valve is abnormal. Moderate to severe mitral  valve regurgitation.   Tricuspid Valve: The tricuspid valve is normal in structure. Tricuspid  valve regurgitation is moderate.   Aortic Valve: The aortic valve was not well visualized. Aortic valve  regurgitation is trivial. No aortic stenosis is present. Aortic valve mean  gradient measures 4.0 mmHg. Aortic valve peak gradient measures 7.2 mmHg.  Aortic valve area, by VTI measures  1.60 cm.   Pulmonic Valve: The pulmonic valve was not well visualized. Pulmonic valve  regurgitation is not visualized.   Aorta: The aortic root is normal in size and structure.   Venous: The inferior vena cava is dilated in size with less than 50%  respiratory variability, suggesting right atrial pressure of 15 mmHg.   IAS/Shunts: The interatrial septum was not well visualized.   Patient Profile     Shannon Garrett is a 62 y.o. female with a hx of (HFrEF) heart failure with reduced ejection fraction secondary to non-ischemic cardiomyopathy, hypertension, diabetes mellitus, SVT, multiple sclerosis, adrenal insufficiency, depression, glaucoma, GERD, obesity, aortic atherosclerosis, anemia of chronic disease, intermittent left bundle branch block who is being seen 07/02/2022 for the evaluation of decompensated heart failure at the request of Dr Velia Meyer.  Assessment & Plan    Acute on Chronic heart failure with reduced ejection fraction (HFrEF) Non-ischemic cardiomyopathy   Patient admitted on 07/02/22 with symptoms of acute HF. Reports 20 lbs weight gain.  LVEF 35-40% in 2021 and LHC with no CAD.  LVEF on repeat echo this admission shows decrease to 20-25%, global hypokinesis, severe LV dilatation, grossly normal RV systolic function, severely elevated RVSP (77 mmHg), moderate to severe mitral regurgitation (functional), moderate tricuspid regurgitation. BNP elevated to 3102.7. Lactate 1.3.   Patient now net negative 8.23 L following IV lasix '40mg'$  x6 and has improved respiratory status from admission. Continue IV diuresis today with stable creatinine. Improved air movement noted with stable bilateral lower extremity edema. Continue to closely monitor I/O and weight. Daily fluid intake should be limited to <1567m. Continue GDMT: Spironolactone '25mg'$  Losartan 12.'5mg'$  (decreased with hypotension) Toprol '75mg'$  (reduced in the setting of acute exacerbation) Could consider future transition to Entresto if BP will tolerate No SGLT2 with chronic foley   Concern for dysphagia   Patient s/p SLP eval on 1/3, found with brisk swallow response and no evidence of aspiration.   Hypertension   Patient's blood pressure well controlled with HF GDMT. Losartan added this admission, reduced to 12.'5mg'$  with low-normal BP.   Elevated troponin   Patient with troponin mildly elevated but flat (18, 19, 20) in the setting of acute CHF. Consistent with demand ischemia. No chest pain today.   Aortic atherosclerosis   Patient on ASA, statin. Appears that she has been taking '325mg'$  ASA daily, decrease to '81mg'$ .    LUE swelling   Upper extremity doppler negative for DVT.   Per primary team   MS DM Urinary pain     For questions or updates, please contact [place]quonock BridgePlease consult [email or web address] for contact info under        Signed, [employer]  Marlou Porch, MD  07/05/2022, 9:34 AM    Personally seen and examined. Agree with above.  Continue with IV diuresis Lasix 40 mg twice daily. Will go ahead and change aspirin from 325-81 to minimize bleeding risk. Blood  pressure remains well-controlled on limited goal-directed medical therapy as above.  Troponin is flat 18-20 compatible with myocardial injury in the setting of heart failure.  Candee Furbish, MD

## 2022-07-06 ENCOUNTER — Institutional Professional Consult (permissible substitution): Payer: 59 | Admitting: Pulmonary Disease

## 2022-07-06 DIAGNOSIS — I5043 Acute on chronic combined systolic (congestive) and diastolic (congestive) heart failure: Secondary | ICD-10-CM | POA: Diagnosis not present

## 2022-07-06 LAB — BASIC METABOLIC PANEL
Anion gap: 12 (ref 5–15)
BUN: 9 mg/dL (ref 6–20)
CO2: 31 mmol/L (ref 22–32)
Calcium: 9.1 mg/dL (ref 8.9–10.3)
Chloride: 100 mmol/L (ref 98–111)
Creatinine, Ser: 0.77 mg/dL (ref 0.44–1.00)
GFR, Estimated: >60 mL/min (ref >60)
Glucose, Bld: 116 mg/dL — ABNORMAL HIGH (ref 70–99)
Potassium: 4 mmol/L (ref 3.5–5.1)
Sodium: 143 mmol/L (ref 135–145)

## 2022-07-06 LAB — GLUCOSE, CAPILLARY
Glucose-Capillary: 132 mg/dL — ABNORMAL HIGH (ref 70–99)
Glucose-Capillary: 144 mg/dL — ABNORMAL HIGH (ref 70–99)

## 2022-07-06 MED ORDER — BENZONATATE 200 MG PO CAPS: 200.0000 mg | ORAL_CAPSULE | Freq: Three times a day (TID) | ORAL | 0 refills | Status: AC

## 2022-07-06 MED ORDER — GABAPENTIN 600 MG PO TABS: 600.0000 mg | ORAL_TABLET | Freq: Three times a day (TID) | ORAL | Status: DC

## 2022-07-06 MED ORDER — METOPROLOL SUCCINATE ER 50 MG PO TB24: 75.0000 mg | ORAL_TABLET | Freq: Every morning | ORAL | Status: DC

## 2022-07-06 MED ORDER — MAGNESIUM OXIDE -MG SUPPLEMENT 400 (240 MG) MG PO TABS: 400.0000 mg | ORAL_TABLET | Freq: Every day | ORAL | Status: DC

## 2022-07-06 MED ORDER — FUROSEMIDE 40 MG PO TABS: 40.0000 mg | ORAL_TABLET | Freq: Two times a day (BID) | ORAL | Status: DC

## 2022-07-06 MED ORDER — FUROSEMIDE 40 MG PO TABS
40.0000 mg | ORAL_TABLET | Freq: Two times a day (BID) | ORAL | Status: DC
  Administered 2022-07-06: 40 mg via ORAL
  Filled 2022-07-06: qty 1

## 2022-07-06 MED ORDER — TRAMADOL HCL 50 MG PO TABS: 100.0000 mg | ORAL_TABLET | Freq: Two times a day (BID) | ORAL | 0 refills | Status: DC | PRN

## 2022-07-06 MED ORDER — DM-GUAIFENESIN ER 30-600 MG PO TB12: 1.0000 | ORAL_TABLET | Freq: Two times a day (BID) | ORAL | 0 refills | Status: AC

## 2022-07-06 MED ORDER — LOSARTAN POTASSIUM 25 MG PO TABS: 25.0000 mg | ORAL_TABLET | Freq: Every day | ORAL | Status: DC

## 2022-07-06 MED ORDER — ASPIRIN 81 MG PO TBEC: 81.0000 mg | DELAYED_RELEASE_TABLET | Freq: Every day | ORAL | 0 refills | Status: DC

## 2022-07-06 MED ORDER — BENZONATATE 100 MG PO CAPS
200.0000 mg | ORAL_CAPSULE | Freq: Three times a day (TID) | ORAL | Status: DC
  Administered 2022-07-06: 200 mg via ORAL
  Filled 2022-07-06: qty 2

## 2022-07-06 NOTE — Discharge Summary (Addendum)
Physician Discharge Summary  Parminder Trapani ZOX:[id number] DOB: March 27, 1961 DOA: 07/01/2022  PCP: Pcp, No  Admit date: 07/01/2022 Discharge date: 07/06/2022  Time spent: 45 minutes  Recommendations for Outpatient Follow-up:  Cardiology Dr. Martinique in 2 to 3 weeks Neurology Dr. Arlice Colt in 1 month PCP in 1 week,  consider palliative consult for goals of care Routine suprapubic catheter care Please use additional Lasix as needed for weight gain of 3 pounds in 1 day or 5 pounds in 1 week  Discharge Diagnoses:  Principal Problem:   Acute on chronic combined systolic and diastolic CHF (congestive heart failure) ([place]) Advanced multiple sclerosis Bed/wheelchair bound   MS (multiple sclerosis) ([place])   Depression   Essential hypertension   Acute hypoxic respiratory failure (HCC)   Allergic rhinitis   Elevated troponin   Anemia of chronic disease   DM2 (diabetes mellitus, type 2) ([place])   Discharge Condition: Improved  Diet recommendation: Low-sodium, diabetic  Filed Weights   07/04/22 0421 07/05/22 0526 07/06/22 0533  Weight: 102.1 kg 102.1 kg 96.4 kg    History of present illness:  62 y.o. female with history of chronic systolic CHF, EF 81% pulmonary hypertension, advanced multiple sclerosis, bed/wheelchair bound, SNF resident, essential hypertension, anemia of chronic disease, type 2 diabetes mellitus,  admitted to [facility] on 07/01/2022 with acute on chronic systolic heart failure after presented from snf to [facility] ED w/ shortness of breath.    -Echo with EF down to 20-25%, severely elevated PA pressures, RV could not be assessed  Hospital Course:   Acute on chronic systolic CHF History of nonischemic cardiomyopathy -Echo previously with EF 35-40%, LHC 2021 with no CAD -Now EF down to 20-25% global hypokinesis, normal RV function, severely elevated PA pressures -Improving on diuretics, 11 L negative, transitioned to oral Lasix 40 Mg twice daily today -Continue  Aldactone, losartan, Toprol -Poor candidate for SGLT 2i with advanced MS and bedbound state, suprapubic catheter -Discharge planning, back to SNF, would benefit from palliative care eval at facility, very poor functional status   Acute hypoxic respiratory failure -Secondary to above, weaned off O2  -Continue nebs, pulmonary toilet, suction PRN   Neurogenic bladder, suprapubic catheter -Suprapubic catheter exchanged 1/2 -Clinically do not suspect infection at this time, no fever or leukocytosis   Advanced multiple sclerosis Polypharmacy with increased daytime drowsiness -Followed by Dr. Richardean Chimera, she is bed/wheelchair bound, SNF resident -Continue amantadine, baclofen -Decreased gabapentin dose for excess daytime drowsiness   Type 2 diabetes mellitus -CBGs are stable, A1c is 5.8, sliding scale insulin   Chronic pain -Continue Cymbalta, tramadol, decreased gabapentin dose   Anxiety/depression -Continue Cymbalta, Wellbutrin   Hypomagnesemia -Replaced  Discharge Exam: Vitals:   07/06/22 0718 07/06/22 1105  BP: 132/76 134/81  Pulse: 96 90  Resp: 16 20  Temp: 98 F (36.7 C)   SpO2: 94% 99%   General exam: Obese chronically ill female sitting up in bed, oriented x3 HEENT: Neck obese unable to assess JVD CVs: S1-S2, regular rhythm Lungs: Scattered rhonchi, decreased breath sounds to bases Abdomen: Soft, nontender, bowel sounds present Extremities: trace edema, PRAFO boots on Psychiatry: Flat affect Neuro: paraplegic  Discharge Instructions   Discharge Instructions     Diet - low sodium heart healthy   Complete by: As directed    Diet Carb Modified   Complete by: As directed    Discharge wound care:   Complete by: As directed    Routine   Increase activity slowly   Complete  by: As directed       Allergies as of 07/06/2022       Reactions   Sulfonamide Derivatives Hives, Shortness Of Breath   Penicillins Hives, Itching   Prior course of rocephin  charted 05/2015 Also note has tolerated Zosyn in past 2016 and 2017        Medication List     STOP taking these medications    azelastine 0.1 % nasal spray Commonly known as: ASTELIN   cefTRIAXone 1 g injection Commonly known as: ROCEPHIN   cephALEXin 250 MG capsule Commonly known as: KEFLEX   docusate sodium 100 MG capsule Commonly known as: COLACE   doxycycline 100 MG capsule Commonly known as: VIBRAMYCIN   Lantus SoloStar 100 UNIT/ML Solostar Pen Generic drug: insulin glargine   oxyCODONE-acetaminophen 5-325 MG tablet Commonly known as: PERCOCET/ROXICET       TAKE these medications    acetaminophen 325 MG tablet Commonly known as: TYLENOL Take 650 mg by mouth every 8 (eight) hours as needed (pain, fever > 100.5).   albuterol (2.5 MG/3ML) 0.083% nebulizer solution Commonly known as: PROVENTIL Take 2.5 mg by nebulization every 6 (six) hours as needed for wheezing or shortness of breath.   albuterol 108 (90 Base) MCG/ACT inhaler Commonly known as: VENTOLIN HFA Inhale 2 puffs into the lungs every 6 (six) hours as needed for wheezing or shortness of breath.   amantadine 100 MG capsule Commonly known as: SYMMETREL Take 100 mg by mouth in the morning.   ascorbic acid 500 MG tablet Commonly known as: VITAMIN C Take 500 mg by mouth in the morning.   aspirin EC 81 MG tablet Take 1 tablet (81 mg total) by mouth daily. What changed:  medication strength how much to take when to take this   AZO-CRANBERRY PO Take 2 each by mouth in the morning.   baclofen 10 MG tablet Commonly known as: LIORESAL Take 10 mg by mouth 3 (three) times daily. Hold if lethargic or confused   benzonatate 200 MG capsule Commonly known as: TESSALON Take 1 capsule (200 mg total) by mouth 3 (three) times daily for 3 days.   bisacodyl 10 MG suppository Commonly known as: DULCOLAX Place 10 mg rectally daily as needed for moderate constipation. If no results for MOM   buPROPion  150 MG 24 hr tablet Commonly known as: WELLBUTRIN XL Take 150 mg by mouth in the morning.   dextromethorphan-guaiFENesin 30-600 MG 12hr tablet Commonly known as: MUCINEX DM Take 1 tablet by mouth 2 (two) times daily for 3 days.   DULoxetine 20 MG capsule Commonly known as: CYMBALTA Take 60 mg by mouth in the morning.   Ensure Take 237 mLs by mouth 3 (three) times daily.   ferrous sulfate 325 (65 FE) MG tablet Take 325 mg by mouth 2 (two) times daily with a meal.   fluticasone 50 MCG/ACT nasal spray Commonly known as: FLONASE Place 1 spray into both nostrils at bedtime.   furosemide 40 MG tablet Commonly known as: LASIX Take 1 tablet (40 mg total) by mouth 2 (two) times daily. What changed:  medication strength how much to take when to take this   gabapentin 600 MG tablet Commonly known as: NEURONTIN Take 1 tablet (600 mg total) by mouth 3 (three) times daily. What changed:  medication strength how much to take   ipratropium-albuterol 0.5-2.5 (3) MG/3ML Soln Commonly known as: DUONEB Inhale 3 mLs into the lungs every 8 (eight) hours as needed (wheezing, shortness of  breath).   latanoprost 0.005 % ophthalmic solution Commonly known as: XALATAN Place 1 drop into both eyes at bedtime.   linaclotide 290 MCG Caps capsule Commonly known as: LINZESS Take 290 mcg by mouth daily before breakfast.   losartan 25 MG tablet Commonly known as: COZAAR Take 1 tablet (25 mg total) by mouth daily. Start taking on: July 07, 2022   magnesium oxide 400 (240 Mg) MG tablet Commonly known as: MAG-OX Take 1 tablet (400 mg total) by mouth daily. Start taking on: July 07, 2022   melatonin 5 MG Tabs Take 10 mg by mouth at bedtime.   metoprolol succinate 50 MG 24 hr tablet Commonly known as: TOPROL-XL Take 1.5 tablets (75 mg total) by mouth in the morning. What changed:  medication strength how much to take   MULTIVITAMIN ADULT PO Take 1 tablet by mouth in the morning.    NONFORMULARY OR COMPOUNDED ITEM Apply 1 application  topically 3 (three) times daily. A&D/Balmex/Lidocaine 1:1:1. Apply to vulva 3 times daily.   NovoLOG FlexPen 100 UNIT/ML FlexPen Generic drug: insulin aspart 0-15 Units, Subcutaneous, 3 times daily with meals CBG < 70: implement hypoglycemia protocol-call MD CBG 70 - 120: 0 units CBG 121 - 150: 2 units CBG 151 - 200: 3 units CBG 201 - 250: 5 units CBG 251 - 300: 8 units CBG 301 - 350: 11 units CBG 351 - 400: 15 units CBG > 400: What changed:  how much to take how to take this when to take this additional instructions   nystatin powder Commonly known as: MYCOSTATIN/NYSTOP Apply 1 Application topically 2 (two) times daily. To vagina, armpits   ondansetron 4 MG tablet Commonly known as: ZOFRAN Take 4 mg by mouth every 6 (six) hours as needed for nausea or vomiting.   OXYGEN Inhale 2-4 L into the lungs as needed (O2 sats <93%).   polyethylene glycol 17 g packet Commonly known as: MiraLax Mix-In Pax Take 17 g by mouth daily. What changed: when to take this   Potassium Chloride ER 20 MEQ Tbcr Take 20 mEq by mouth in the morning.   PROBIOTIC PO Take 1 capsule by mouth 2 (two) times daily.   senna-docusate 8.6-50 MG tablet Commonly known as: Senokot-S Take 2 tablets by mouth 2 (two) times daily.   simethicone 125 MG chewable tablet Commonly known as: MYLICON Chew 767 mg by mouth every 6 (six) hours as needed for flatulence.   simvastatin 10 MG tablet Commonly known as: ZOCOR Take 10 mg by mouth at bedtime.   spironolactone 25 MG tablet Commonly known as: ALDACTONE Take 25 mg by mouth in the morning.   traMADol 50 MG tablet Commonly known as: ULTRAM Take 2 tablets (100 mg total) by mouth 2 (two) times daily as needed (pain).               Discharge Care Instructions  (From admission, onward)           Start     Ordered   07/06/22 0000  Discharge wound care:       Comments: Routine   07/06/22  1121           Allergies  Allergen Reactions   Sulfonamide Derivatives Hives and Shortness Of Breath   Penicillins Hives and Itching    Prior course of rocephin charted 05/2015 Also note has tolerated Zosyn in past 2016 and 2017      The results of significant diagnostics from this hospitalization (including imaging, microbiology,  ancillary and laboratory) are listed below for reference.    Significant Diagnostic Studies: VAS [place] UPPER EXTREMITY VENOUS DUPLEX  Result Date: 07/03/2022 UPPER VENOUS STUDY  Patient Name:  OLUWANIFEMI PETITTI  Date of Exam:   07/02/2022 Medical Rec #: [id number]      Accession #:    [id number] Date of Birth: 11/09/1960      Patient Gender: F Patient Age:   3 years Exam Location:  [facility] Procedure:      VAS [place] UPPER EXTREMITY VENOUS DUPLEX Referring Phys: A POWELL JR --------------------------------------------------------------------------------  Indications: Edema Risk Factors: MS, Heart failure. Limitations: Body habitus, MS. Comparison Study: No prior study on file Performing Technologist: Sharion Dove RVS  Examination Guidelines: A complete evaluation includes B-mode imaging, spectral Doppler, color Doppler, and power Doppler as needed of all accessible portions of each vessel. Bilateral testing is considered an integral part of a complete examination. Limited examinations for reoccurring indications may be performed as noted.  Left Findings: +----------+------------+---------+-----------+----------+---------------+ LEFT      CompressiblePhasicitySpontaneousProperties    Summary     +----------+------------+---------+-----------+----------+---------------+ IJV           Full       Yes       Yes                              +----------+------------+---------+-----------+----------+---------------+ Subclavian    Full                                                  +----------+------------+---------+-----------+----------+---------------+  Axillary                 Yes       Yes                              +----------+------------+---------+-----------+----------+---------------+ Brachial                                            patent by color +----------+------------+---------+-----------+----------+---------------+ Radial                                              Not visualized  +----------+------------+---------+-----------+----------+---------------+ Ulnar                                               Not visualized  +----------+------------+---------+-----------+----------+---------------+ Cephalic                                            patent by color +----------+------------+---------+-----------+----------+---------------+ Basilic                                             Not visualized  +----------+------------+---------+-----------+----------+---------------+  Summary:  Left: No obvious evidence of deep vein thrombosis or superficial vein thrombosis in the visualized veins of the upper extremity.  *See table(s) above for measurements and observations.  Diagnosing physician: Deitra Mayo MD Electronically signed by Deitra Mayo MD on 07/03/2022 at 3:49:59 PM.    Final    VAS [place] LOWER EXTREMITY VENOUS (DVT)  Result Date: 07/03/2022  Lower Venous DVT Study Patient Name:  RAYVIN ABID  Date of Exam:   07/02/2022 Medical Rec #: [id number]      Accession #:    [id number] Date of Birth: 04-15-1961      Patient Gender: F Patient Age:   61 years Exam Location:  [facility] Procedure:      VAS [place] LOWER EXTREMITY VENOUS (DVT) Referring Phys: A POWELL JR --------------------------------------------------------------------------------  Indications: Edema, and SOB.  Risk Factors: Heart failure, EF 20-25%. Limitations: Body habitus and MS, reduced EF, slow breathing. Comparison Study: Prior negative bilateral LEV done 10/14/13 Performing Technologist: Sharion Dove RVS  Examination  Guidelines: A complete evaluation includes B-mode imaging, spectral Doppler, color Doppler, and power Doppler as needed of all accessible portions of each vessel. Bilateral testing is considered an integral part of a complete examination. Limited examinations for reoccurring indications may be performed as noted. The reflux portion of the exam is performed with the patient in reverse Trendelenburg.  +---------+---------------+---------+-----------+----------+-------------------+ RIGHT    CompressibilityPhasicitySpontaneityPropertiesThrombus Aging      +---------+---------------+---------+-----------+----------+-------------------+ CFV      Full                                                             +---------+---------------+---------+-----------+----------+-------------------+ SFJ      Full                                                             +---------+---------------+---------+-----------+----------+-------------------+ FV Prox  Full                                                             +---------+---------------+---------+-----------+----------+-------------------+ FV Mid   Full           Yes      Yes                                      +---------+---------------+---------+-----------+----------+-------------------+ FV DistalFull                                                             +---------+---------------+---------+-----------+----------+-------------------+ PFV      Full                                                             +---------+---------------+---------+-----------+----------+-------------------+  POP      Full           Yes      Yes                                      +---------+---------------+---------+-----------+----------+-------------------+ PTV                                                   Not well visualized +---------+---------------+---------+-----------+----------+-------------------+ PERO                                                   Not well visualized +---------+---------------+---------+-----------+----------+-------------------+   +---------+---------------+---------+-----------+----------+-------------------+ LEFT     CompressibilityPhasicitySpontaneityPropertiesThrombus Aging      +---------+---------------+---------+-----------+----------+-------------------+ CFV      Full                                                             +---------+---------------+---------+-----------+----------+-------------------+ SFJ      Full                                                             +---------+---------------+---------+-----------+----------+-------------------+ FV Prox  Full                                                             +---------+---------------+---------+-----------+----------+-------------------+ FV Mid   Full                                                             +---------+---------------+---------+-----------+----------+-------------------+ FV DistalFull                                                             +---------+---------------+---------+-----------+----------+-------------------+ POP      Full                                                             +---------+---------------+---------+-----------+----------+-------------------+ PTV  Not well visualized +---------+---------------+---------+-----------+----------+-------------------+ PERO                                                  Not well visualized +---------+---------------+---------+-----------+----------+-------------------+     Summary: RIGHT: - There is no evidence of deep vein thrombosis in the lower extremity. However, portions of this examination were limited- see technologist comments above.  - No cystic structure found in the popliteal fossa.  LEFT: - There is no  evidence of deep vein thrombosis in the lower extremity. However, portions of this examination were limited- see technologist comments above.  - No cystic structure found in the popliteal fossa.  *See table(s) above for measurements and observations. Electronically signed by Deitra Mayo MD on 07/03/2022 at 3:47:36 PM.    Final    MR BRAIN WO CONTRAST  Result Date: 07/03/2022 CLINICAL DATA:  Multiple sclerosis.  Right upper extremity weakness. EXAM: MRI HEAD WITHOUT CONTRAST TECHNIQUE: Multiplanar, multiecho pulse sequences of the brain and surrounding structures were obtained without intravenous contrast. COMPARISON:  Brain MRI 07/11/2015 FINDINGS: Brain: No acute infarction, hemorrhage, hydrocephalus, extra-axial collection or mass lesion. There are numerous white matter lesions in unchanged distribution since 07/11/2015. There are no new lesions. No chronic microhemorrhage. Unchanged brain volume. Vascular: Normal flow voids. Skull and upper cervical spine: Normal marrow signal. Sinuses/Orbits: Negative. Other: None. IMPRESSION: Unchanged distribution of numerous white matter lesions in a pattern consistent with multiple sclerosis. No new lesions. No acute abnormality. Electronically Signed   By: Ulyses Jarred M.D.   On: 07/03/2022 00:18   ECHOCARDIOGRAM COMPLETE  Result Date: 07/02/2022    ECHOCARDIOGRAM REPORT   Patient Name:   Madine Sarr Date of Exam: 07/02/2022 Medical Rec #:  [id number]     Height:       65.0 in Accession #:    [id number]    Weight:       220.0 lb Date of Birth:  20-Jan-1961     BSA:          2.060 m Patient Age:    52 years      BP:           133/71 mmHg Patient Gender: F             HR:           87 bpm. Exam Location:  [place] Procedure: 2D Echo, Cardiac Doppler, Color Doppler and Intracardiac            Opacification Agent Indications:    CHF-acute systolic  History:        Patient has prior history of Echocardiogram examinations, most                 recent 05/20/2020. CHF;  Risk Factors:Diabetes, Hypertension,                 Dyslipidemia and Former Smoker. Multiple sclerosis. Acute                 hypoxic respiratory therapy. Elevated troponin. GERD.  Sonographer:    Clayton Lefort RDCS (AE) Referring Phys: Rhetta Mura  Sonographer Comments: Patient is obese. Image acquisition challenging due to patient body habitus. Limited mobility. IMPRESSIONS  1. Left ventricular ejection fraction, by estimation, is 20 to 25%. The left ventricle has severely decreased function. The left ventricle demonstrates global hypokinesis. The  left ventricular internal cavity size was severely dilated. There is mild left ventricular hypertrophy. Left ventricular diastolic parameters are indeterminate.  2. Right ventricular systolic function was not well visualized. The right ventricular size is not well visualized. Poor RV visualization but appears grossly normal size and systolic function. There is severely elevated pulmonary artery systolic pressure. The estimated right ventricular systolic pressure is 98.9 mmHg.  3. Left atrial size was moderately dilated.  4. The mitral valve is abnormal. Moderate to severe mitral valve regurgitation. Appears functional  5. Tricuspid valve regurgitation is moderate.  6. The aortic valve was not well visualized. Aortic valve regurgitation is trivial. No aortic stenosis is present.  7. The inferior vena cava is dilated in size with <50% respiratory variability, suggesting right atrial pressure of 15 mmHg. FINDINGS  Left Ventricle: Left ventricular ejection fraction, by estimation, is 20 to 25%. The left ventricle has severely decreased function. The left ventricle demonstrates global hypokinesis. The left ventricular internal cavity size was severely dilated. There is mild left ventricular hypertrophy. Left ventricular diastolic parameters are indeterminate. Right Ventricle: The right ventricular size is not well visualized. Right vetricular wall thickness was not well  visualized. Right ventricular systolic function was not well visualized. There is severely elevated pulmonary artery systolic pressure. The tricuspid regurgitant velocity is 3.96 m/s, and with an assumed right atrial pressure of 15 mmHg, the estimated right ventricular systolic pressure is 21.1 mmHg. Left Atrium: Left atrial size was moderately dilated. Right Atrium: Right atrial size was normal in size. Pericardium: There is no evidence of pericardial effusion. Mitral Valve: The mitral valve is abnormal. Moderate to severe mitral valve regurgitation. Tricuspid Valve: The tricuspid valve is normal in structure. Tricuspid valve regurgitation is moderate. Aortic Valve: The aortic valve was not well visualized. Aortic valve regurgitation is trivial. No aortic stenosis is present. Aortic valve mean gradient measures 4.0 mmHg. Aortic valve peak gradient measures 7.2 mmHg. Aortic valve area, by VTI measures 1.60 cm. Pulmonic Valve: The pulmonic valve was not well visualized. Pulmonic valve regurgitation is not visualized. Aorta: The aortic root is normal in size and structure. Venous: The inferior vena cava is dilated in size with less than 50% respiratory variability, suggesting right atrial pressure of 15 mmHg. IAS/Shunts: The interatrial septum was not well visualized.  LEFT VENTRICLE PLAX 2D LVIDd:         5.70 cm LVIDs:         5.00 cm LV PW:         1.30 cm LV IVS:        0.80 cm LVOT diam:     1.80 cm LV SV:         30 LV SV Index:   14 LVOT Area:     2.54 cm  RIGHT VENTRICLE             IVC RV Basal diam:  3.40 cm     IVC diam: 2.20 cm RV S prime:     11.10 cm/s TAPSE (M-mode): 1.9 cm LEFT ATRIUM             Index        RIGHT ATRIUM           Index LA diam:        4.70 cm 2.28 cm/m   RA Area:     17.10 cm LA Vol (A2C):   81.6 ml 39.61 ml/m  RA Volume:   50.00 ml  24.27 ml/m LA Vol (A4C):  83.4 ml 40.49 ml/m LA Biplane Vol: 85.1 ml 41.31 ml/m  AORTIC VALVE AV Area (Vmax):    1.64 cm AV Area (Vmean):    1.22 cm AV Area (VTI):     1.60 cm AV Vmax:           134.00 cm/s AV Vmean:          92.400 cm/s AV VTI:            0.185 m AV Peak Grad:      7.2 mmHg AV Mean Grad:      4.0 mmHg LVOT Vmax:         86.60 cm/s LVOT Vmean:        44.200 cm/s LVOT VTI:          0.116 m LVOT/AV VTI ratio: 0.63  AORTA Ao Root diam: 3.00 cm MR Peak grad:    89.9 mmHg    TRICUSPID VALVE MR Mean grad:    52.0 mmHg    TR Peak grad:   62.7 mmHg MR Vmax:         474.00 cm/s  TR Vmax:        396.00 cm/s MR Vmean:        333.0 cm/s MR PISA:         1.57 cm     SHUNTS MR PISA Eff ROA: 13 mm       Systemic VTI:  0.12 m MR PISA Radius:  0.50 cm      Systemic Diam: 1.80 cm Oswaldo Milian MD Electronically signed by Oswaldo Milian MD Signature Date/Time: 07/02/2022/10:36:02 AM    Final    DG Chest Portable 1 View  Result Date: 07/01/2022 CLINICAL DATA:  Shortness of breath EXAM: PORTABLE CHEST 1 VIEW COMPARISON:  05/01/2022 FINDINGS: Cardiomegaly with vascular congestion, mild edema and probable pleural effusions. Basilar consolidations. No pneumothorax IMPRESSION: Cardiomegaly with vascular congestion, mild edema and probable pleural effusions. Basilar consolidations may be due to atelectasis or pneumonia. Electronically Signed   By: Donavan Foil M.D.   On: 07/01/2022 16:59    Microbiology: Recent Results (from the past 240 hour(s))  Resp panel by RT-PCR (RSV, Flu A&B, Covid) Anterior Nasal Swab     Status: None   Collection Time: 07/02/22  7:40 PM   Specimen: Anterior Nasal Swab  Result Value Ref Range Status   SARS Coronavirus 2 by RT PCR NEGATIVE NEGATIVE Final    Comment: (NOTE) SARS-CoV-2 target nucleic acids are NOT DETECTED.  The SARS-CoV-2 RNA is generally detectable in upper respiratory specimens during the acute phase of infection. The lowest concentration of SARS-CoV-2 viral copies this assay can detect is 138 copies/mL. A negative result does not preclude SARS-Cov-2 infection and should not be used as  the sole basis for treatment or other patient management decisions. A negative result may occur with  improper specimen collection/handling, submission of specimen other than nasopharyngeal swab, presence of viral mutation(s) within the areas targeted by this assay, and inadequate number of viral copies(<138 copies/mL). A negative result must be combined with clinical observations, patient history, and epidemiological information. The expected result is Negative.  Fact Sheet for Patients:  EntrepreneurPulse.com.au  Fact Sheet for Healthcare Providers:  IncredibleEmployment.be  This test is no t yet approved or cleared by the [place] FDA and  has been authorized for detection and/or diagnosis of SARS-CoV-2 by FDA under an Emergency Use Authorization (EUA). This EUA will remain  in effect (meaning this test can be used) for  the duration of the COVID-19 declaration under Section 564(b)(1) of the Act, 21 U.S.C.section 360bbb-3(b)(1), unless the authorization is terminated  or revoked sooner.       Influenza A by PCR NEGATIVE NEGATIVE Final   Influenza B by PCR NEGATIVE NEGATIVE Final    Comment: (NOTE) The Xpert Xpress SARS-CoV-2/FLU/RSV plus assay is intended as an aid in the diagnosis of influenza from Nasopharyngeal swab specimens and should not be used as a sole basis for treatment. Nasal washings and aspirates are unacceptable for Xpert Xpress SARS-CoV-2/FLU/RSV testing.  Fact Sheet for Patients: EntrepreneurPulse.com.au  Fact Sheet for Healthcare Providers: IncredibleEmployment.be  This test is not yet approved or cleared by the [place] FDA and has been authorized for detection and/or diagnosis of SARS-CoV-2 by FDA under an Emergency Use Authorization (EUA). This EUA will remain in effect (meaning this test can be used) for the duration of the COVID-19 declaration under Section 564(b)(1) of  the Act, 21 U.S.C. section 360bbb-3(b)(1), unless the authorization is terminated or revoked.     Resp Syncytial Virus by PCR NEGATIVE NEGATIVE Final    Comment: (NOTE) Fact Sheet for Patients: EntrepreneurPulse.com.au  Fact Sheet for Healthcare Providers: IncredibleEmployment.be  This test is not yet approved or cleared by the [place] FDA and has been authorized for detection and/or diagnosis of SARS-CoV-2 by FDA under an Emergency Use Authorization (EUA). This EUA will remain in effect (meaning this test can be used) for the duration of the COVID-19 declaration under Section 564(b)(1) of the Act, 21 U.S.C. section 360bbb-3(b)(1), unless the authorization is terminated or revoked.  Performed at [facility], [place] [address] [place], [place] [address]      Labs: Basic Metabolic Panel: Recent Labs  Lab 07/01/22 1817 07/01/22 1926 07/02/22 0500 07/03/22 0056 07/04/22 0037 07/05/22 0042 07/06/22 0142  NA  --    < > 144 144 138 142 143  K  --    < > 3.5 3.4* 3.7 4.2 4.0  CL  --   --  108 101 98 100 100  CO2  --   --  30 32 28 33* 31  GLUCOSE  --   --  122* 114* 143* 79 116*  BUN  --   --  '10 10 9 7 9  '$ CREATININE  --   --  0.80 0.92 0.67 0.76 0.77  CALCIUM  --   --  8.4* 8.5* 8.3* 8.4* 9.1  MG 1.8  --  1.7 1.7 1.6*  --   --   PHOS  --   --  3.5 3.0 2.3*  --   --    < > = values in this interval not displayed.   Liver Function Tests: Recent Labs  Lab 07/01/22 1616 07/02/22 0500 07/03/22 0056  AST '17 16 16  '$ ALT '17 17 15  '$ ALKPHOS 126 128* 137*  BILITOT 0.5 0.4 0.4  PROT 5.7* 5.9* 5.8*  ALBUMIN 2.8* 2.9* 2.9*   No results for input(s): "LIPASE", "AMYLASE" in the last 168 hours. No results for input(s): "AMMONIA" in the last 168 hours. CBC: Recent Labs  Lab 07/01/22 1616 07/01/22 1926 07/02/22 0500 07/03/22 0056 07/04/22 0037  WBC 6.7  --  6.8 5.6 5.8  NEUTROABS 3.9  --  3.5  --   --   HGB 10.8* 11.2* 10.9*  10.9* 10.8*  HCT 33.0* 33.0* 33.8* 33.1* 34.0*  MCV 87.8  --  87.3 85.3 87.0  PLT 288  --  269 249 249  Cardiac Enzymes: No results for input(s): "CKTOTAL", "CKMB", "CKMBINDEX", "TROPONINI" in the last 168 hours. BNP: BNP (last 3 results) Recent Labs    05/01/22 1610 07/01/22 1617  BNP 1,272.9* 3,102.7*    ProBNP (last 3 results) No results for input(s): "PROBNP" in the last 8760 hours.  CBG: Recent Labs  Lab 07/05/22 1044 07/05/22 1607 07/05/22 2116 07/06/22 0626 07/06/22 1103  GLUCAP 175* 94 133* 132* 144*       Signed:  Domenic Polite MD.  [facility] 07/06/2022, 11:54 AM

## 2022-07-06 NOTE — TOC Transition Note (Signed)
Transition of Care [facility]) - CM/SW Discharge Note   Patient Details  Name: Shannon Garrett MRN: [id number] Date of Birth: 1961-03-24  Transition of Care [facility]) CM/SW Contact:  Bjorn Pippin, LCSW Phone Number: 07/06/2022, 2:43 PM  Clinical Narrative:     Patient will DC to: [facility]  Anticipated DC date: 07/06/2022 Family notified: Yes, Debra (sister) Transport by: Corey Harold   Per MD patient ready for DC to [facility]. RN, patient, patient's family, and facility notified of DC. Discharge Summary and FL2 sent to facility. RN to call report prior to discharge [phone]. DC packet on chart. Ambulance transport requested for patient.   CSW will sign off for now as social work intervention is no longer needed. Please consult [place] again if new needs arise.     Final next level of care: [place] Barriers to Discharge: No Barriers Identified   Patient Goals and CMS Choice      Discharge Placement                Patient chooses bed at:  [facility]) Patient to be transferred to facility by: [place] Name of family member notified: Debra(sister) Patient and family notified of of transfer: 07/06/22  Discharge Plan and Services Additional resources added to the After Visit Summary for                                       Social Determinants of Health (SDOH) Interventions SDOH Screenings   Food Insecurity: No Food Insecurity (07/02/2022)  Transportation Needs: No Transportation Needs (07/02/2022)  Utilities: Not At Risk (07/02/2022)  Financial Resource Strain: Low Risk  (02/13/2018)  Physical Activity: Inactive (02/13/2018)  Social Connections: Moderately Isolated (02/13/2018)  Stress: No Stress Concern Present (02/13/2018)  Tobacco Use: Medium Risk (07/02/2022)     Readmission Risk Interventions     No data to display            Beckey Rutter, MSW, LCSWA, LCASA Transitions of Care  Clinical Social Worker I

## 2022-07-06 NOTE — Progress Notes (Deleted)
Synopsis: Referred in *** for ***  Subjective:   PATIENT ID: Shannon Garrett GENDER: female DOB: Apr 14, 1961, MRN: [id number]   HPI  No chief complaint on file.   ***  Past Medical History:  Diagnosis Date   Adrenal insufficiency ([place])    Anemia of chronic disease    Aortic atherosclerosis ([place])    COVID-19 05/10/2019   Per facility paperwork   Decubitus ulcer    stage II sacral decubitus ulcer.    Depression    DM2 (diabetes mellitus, type 2) ([place])    Glaucoma    Glaucoma    HFrEF (heart failure with reduced ejection fraction) ([place])    TTE 04/2012: EF 20-25 // TTE 08/2012: EF 50-55 // Cath 10/19/19: Normal Cors // TTE 05/20/20: EF 35-40, trivial MR, mild AI   Hypertension    MS (multiple sclerosis) ([place])    a. Dx'd late 20's. b. Tx with Novantrone, Tysabri, Copaxone previously.   NICM (nonischemic cardiomyopathy) ([place])    cath 10/2019: normal cors // probably due to multiple chemo-Tx agents used for MS   SVT (supraventricular tachycardia)      Family History  Problem Relation Age of Onset   Hypertension Mother    Cancer Mother        breast    Cancer Father        prostate   Multiple sclerosis Sister    Heart attack Neg Hx      Social History   Socioeconomic History   Marital status: Single    Spouse name: Not on file   Number of children: Not on file   Years of education: Not on file   Highest education level: Not on file  Occupational History   Not on file  Tobacco Use   Smoking status: Former    Packs/day: 0.50    Years: 5.00    Total pack years: 2.50    Types: Cigarettes   Smokeless tobacco: Never  Vaping Use   Vaping Use: Never used  Substance and Sexual Activity   Alcohol use: No   Drug use: No   Sexual activity: Not Currently  Other Topics Concern   Not on file  Social History Narrative   Not on file   Social Determinants of Health   Financial Resource Strain: Low Risk  (02/13/2018)   Overall Financial Resource Strain (CARDIA)     Difficulty of Paying Living Expenses: Not hard at all  Food Insecurity: No Food Insecurity (07/02/2022)   Hunger Vital Sign    Worried About Running Out of Food in the Last Year: Never true    Ran Out of Food in the Last Year: Never true  Transportation Needs: No Transportation Needs (07/02/2022)   PRAPARE - Hydrologist (Medical): No    Lack of Transportation (Non-Medical): No  Physical Activity: Inactive (02/13/2018)   Exercise Vital Sign    Days of Exercise per Week: 0 days    Minutes of Exercise per Session: 0 min  Stress: No Stress Concern Present (02/13/2018)   [place]    Feeling of Stress : Only a little  Social Connections: Moderately Isolated (02/13/2018)   Social Connection and Isolation Panel [NHANES]    Frequency of Communication with Friends and Family: More than three times a week    Frequency of Social Gatherings with Friends and Family: Twice a week    Attends Religious Services: Never    Active  Member of Clubs or Organizations: No    Attends Archivist Meetings: Never    Marital Status: Never married  Intimate Partner Violence: Not At Risk (07/02/2022)   Humiliation, Afraid, Rape, and Kick questionnaire    Fear of Current or Ex-Partner: No    Emotionally Abused: No    Physically Abused: No    Sexually Abused: No     Allergies  Allergen Reactions   Sulfonamide Derivatives Hives and Shortness Of Breath   Penicillins Hives and Itching    Prior course of rocephin charted 05/2015 Also note has tolerated Zosyn in past 2016 and 2017     Facility-Administered Medications Prior to Visit  Medication Dose Route Frequency Provider Last Rate Last Admin   acetaminophen (TYLENOL) tablet 650 mg  650 mg Oral Q6H PRN Howerter, Justin B, DO   650 mg at 07/02/22 0734   Or   acetaminophen (TYLENOL) suppository 650 mg  650 mg Rectal Q6H PRN Howerter, Justin B, DO       albuterol  (PROVENTIL) (2.5 MG/3ML) 0.083% nebulizer solution 2.5 mg  2.5 mg Nebulization Q6H PRN Howerter, Justin B, DO   2.5 mg at 07/02/22 0737   amantadine (SYMMETREL) capsule 100 mg  100 mg Oral Daily Howerter, Justin B, DO   100 mg at 07/05/22 9629   aspirin EC tablet 81 mg  81 mg Oral Daily Jerline Pain, MD       baclofen (LIORESAL) tablet 10 mg  10 mg Oral TID Howerter, Justin B, DO   10 mg at 07/05/22 2127   buPROPion (WELLBUTRIN XL) 24 hr tablet 150 mg  150 mg Oral Daily Howerter, Justin B, DO   150 mg at 07/05/22 0920   dextromethorphan-guaiFENesin ([place] DM) 30-600 MG per 12 hr tablet 1 tablet  1 tablet Oral BID Elodia Florence., MD   1 tablet at 07/05/22 2127   docusate sodium (COLACE) capsule 100 mg  100 mg Oral BID Howerter, Justin B, DO   100 mg at 07/05/22 2127   DULoxetine (CYMBALTA) DR capsule 60 mg  60 mg Oral Daily Howerter, Justin B, DO   60 mg at 07/05/22 0920   enoxaparin (LOVENOX) injection 40 mg  40 mg Subcutaneous Q24H Domenic Polite, MD   40 mg at 07/05/22 1229   ferrous sulfate tablet 325 mg  325 mg Oral BID WC Howerter, Justin B, DO   325 mg at 07/05/22 1656   fluticasone (FLONASE) 50 MCG/ACT nasal spray 1 spray  1 spray Each Nare QHS Howerter, Justin B, DO       furosemide (LASIX) injection 40 mg  40 mg Intravenous BID Howerter, Justin B, DO   40 mg at 07/05/22 1657   gabapentin (NEURONTIN) capsule 600 mg  600 mg Oral TID Domenic Polite, MD   600 mg at 07/05/22 2127   insulin aspart (novoLOG) injection 0-9 Units  0-9 Units Subcutaneous TID WC Howerter, Justin B, DO   1 Units at 07/06/22 0641   latanoprost (XALATAN) 0.005 % ophthalmic solution 1 drop  1 drop Both Eyes QHS Howerter, Justin B, DO   1 drop at 07/05/22 2126   linaclotide (LINZESS) capsule 290 mcg  290 mcg Oral QAC breakfast Howerter, Justin B, DO   290 mcg at 07/05/22 0919   losartan (COZAAR) tablet 12.5 mg  12.5 mg Oral Daily Lily Kocher, PA-C   12.5 mg at 07/05/22 0919   magnesium oxide (MAG-OX)  tablet 400 mg  400 mg Oral Daily  Elodia Florence., MD   400 mg at 07/05/22 0920   melatonin tablet 3 mg  3 mg Oral QHS PRN Howerter, Justin B, DO   3 mg at 07/03/22 2221   metoprolol succinate (TOPROL-XL) 24 hr tablet 75 mg  75 mg Oral Daily Elodia Florence., MD   75 mg at 07/05/22 0919   polyethylene glycol (MIRALAX / GLYCOLAX) packet 17 g  17 g Oral Daily PRN Howerter, Justin B, DO       potassium chloride SA (KLOR-CON M) CR tablet 20 mEq  20 mEq Oral Daily Howerter, Justin B, DO   20 mEq at 07/05/22 0919   senna-docusate (Senokot-S) tablet 2 tablet  2 tablet Oral BID Howerter, Justin B, DO   2 tablet at 07/05/22 2127   simvastatin (ZOCOR) tablet 10 mg  10 mg Oral QHS Howerter, Justin B, DO   10 mg at 07/05/22 2127   spironolactone (ALDACTONE) tablet 25 mg  25 mg Oral Daily Richardson Dopp T, PA-C   25 mg at 07/05/22 0920   traMADol (ULTRAM) tablet 100 mg  100 mg Oral BID PRN Howerter, Justin B, DO   100 mg at 07/05/22 6568   Outpatient Medications Prior to Visit  Medication Sig Dispense Refill   acetaminophen (TYLENOL) 325 MG tablet Take 650 mg by mouth every 8 (eight) hours as needed (pain, fever > 100.5).     albuterol (PROVENTIL) (2.5 MG/3ML) 0.083% nebulizer solution Take 2.5 mg by nebulization every 6 (six) hours as needed for wheezing or shortness of breath.     albuterol (VENTOLIN HFA) 108 (90 Base) MCG/ACT inhaler Inhale 2 puffs into the lungs every 6 (six) hours as needed for wheezing or shortness of breath.      amantadine (SYMMETREL) 100 MG capsule Take 100 mg by mouth in the morning.     ascorbic acid (VITAMIN C) 500 MG tablet Take 500 mg by mouth in the morning.     aspirin 325 MG EC tablet Take 325 mg by mouth in the morning.     azelastine (ASTELIN) 0.1 % nasal spray Place 2 sprays into both nostrils 2 (two) times daily. (Patient not taking: Reported on 07/02/2022)     AZO-CRANBERRY PO Take 2 each by mouth in the morning.     baclofen (LIORESAL) 10 MG tablet Take 10 mg  by mouth 3 (three) times daily. Hold if lethargic or confused     bisacodyl (DULCOLAX) 10 MG suppository Place 10 mg rectally daily as needed for moderate constipation. If no results for MOM     buPROPion (WELLBUTRIN XL) 150 MG 24 hr tablet Take 150 mg by mouth in the morning.     cefTRIAXone (ROCEPHIN) 1 g injection Inject 1 g into the muscle once. (Patient not taking: No sig reported)     cephALEXin (KEFLEX) 250 MG capsule Take 250 mg by mouth 3 (three) times daily. For 5 days (Patient not taking: No sig reported)     docusate sodium (COLACE) 100 MG capsule Take 100 mg by mouth 2 (two) times daily.     doxycycline (VIBRAMYCIN) 100 MG capsule Take 1 capsule (100 mg total) by mouth 2 (two) times daily. (Patient not taking: Reported on 07/02/2022) 20 capsule 0   DULoxetine (CYMBALTA) 20 MG capsule Take 60 mg by mouth in the morning.     Ensure (ENSURE) Take 237 mLs by mouth 3 (three) times daily. (Patient not taking: Reported on 07/02/2022)     ferrous sulfate  325 (65 FE) MG tablet Take 325 mg by mouth 2 (two) times daily with a meal.      fluticasone (FLONASE) 50 MCG/ACT nasal spray Place 1 spray into both nostrils at bedtime.     furosemide (LASIX) 20 MG tablet Take 30 mg by mouth in the morning.     gabapentin (NEURONTIN) 800 MG tablet Take 800 mg by mouth 3 (three) times daily.     insulin aspart (NOVOLOG FLEXPEN) 100 UNIT/ML FlexPen 0-15 Units, Subcutaneous, 3 times daily with meals CBG < 70: implement hypoglycemia protocol-call MD CBG 70 - 120: 0 units CBG 121 - 150: 2 units CBG 151 - 200: 3 units CBG 201 - 250: 5 units CBG 251 - 300: 8 units CBG 301 - 350: 11 units CBG 351 - 400: 15 units CBG > 400: (Patient taking differently: Inject 0-15 Units into the skin 3 (three) times daily with meals. 0-15 Units, Subcutaneous, 3 times daily with meals per sliding scale : CBG < 70: implement hypoglycemia protocol-call MD CBG 0 - 120 : 0 units CBG 121 - 150: 2 units CBG 151 - 200: 3 units CBG 201 -  250: 5 units CBG 251 - 300: 8 units CBG 301 - 350: 11 units CBG 351 - 400: 15 units CBG >400, CALL MD) 15 mL 0   ipratropium-albuterol (DUONEB) 0.5-2.5 (3) MG/3ML SOLN Inhale 3 mLs into the lungs every 8 (eight) hours as needed (wheezing, shortness of breath).     LANTUS SOLOSTAR 100 UNIT/ML Solostar Pen Inject 5 Units into the skin daily. (Patient not taking: Reported on 07/02/2022)     latanoprost (XALATAN) 0.005 % ophthalmic solution Place 1 drop into both eyes at bedtime.     linaclotide (LINZESS) 290 MCG CAPS capsule Take 290 mcg by mouth daily before breakfast.     Melatonin 5 MG TABS Take 10 mg by mouth at bedtime.      metoprolol succinate (TOPROL-XL) 100 MG 24 hr tablet Take 150 mg by mouth in the morning.     Multiple Vitamins-Minerals (MULTIVITAMIN ADULT PO) Take 1 tablet by mouth in the morning.     NONFORMULARY OR COMPOUNDED ITEM Apply 1 application  topically 3 (three) times daily. A&D/Balmex/Lidocaine 1:1:1. Apply to vulva 3 times daily.     nystatin (MYCOSTATIN/NYSTOP) powder Apply 1 Application topically 2 (two) times daily. To vagina, armpits     ondansetron (ZOFRAN) 4 MG tablet Take 4 mg by mouth every 6 (six) hours as needed for nausea or vomiting.     oxyCODONE-acetaminophen (PERCOCET/ROXICET) 5-325 MG tablet Take 1 tablet by mouth every 6 (six) hours as needed for severe pain. 15 tablet 0   OXYGEN Inhale 2-4 L into the lungs as needed (O2 sats <93%).     polyethylene glycol (MIRALAX MIX-IN PAX) 17 g packet Take 17 g by mouth daily. (Patient taking differently: Take 17 g by mouth in the morning.) 14 each 0   Potassium Chloride ER 20 MEQ TBCR Take 20 mEq by mouth in the morning.     Probiotic Product (PROBIOTIC PO) Take 1 capsule by mouth 2 (two) times daily.     senna-docusate (SENOKOT-S) 8.6-50 MG tablet Take 2 tablets by mouth 2 (two) times daily.     simethicone (MYLICON) 194 MG chewable tablet Chew 125 mg by mouth every 6 (six) hours as needed for flatulence.      simvastatin (ZOCOR) 10 MG tablet Take 10 mg by mouth at bedtime.     spironolactone (ALDACTONE) 25  MG tablet Take 25 mg by mouth in the morning.     traMADol (ULTRAM) 50 MG tablet Take 100 mg by mouth 2 (two) times daily as needed (pain).      ROS    Objective:  Physical Exam   There were no vitals filed for this visit.  ***  CBC    Component Value Date/Time   WBC 5.8 07/04/2022 0037   RBC 3.91 07/04/2022 0037   HGB 10.8 (L) 07/04/2022 0037   HGB 12.0 10/26/2019 1100   HCT 34.0 (L) 07/04/2022 0037   HCT 36.3 10/26/2019 1100   PLT 249 07/04/2022 0037   PLT 354 10/26/2019 1100   MCV 87.0 07/04/2022 0037   MCV 83 10/26/2019 1100   MCH 27.6 07/04/2022 0037   MCHC 31.8 07/04/2022 0037   RDW 16.3 (H) 07/04/2022 0037   RDW 16.8 (H) 10/26/2019 1100   LYMPHSABS 1.9 07/02/2022 0500   LYMPHSABS 0.9 08/26/2018 0959   MONOABS 0.7 07/02/2022 0500   EOSABS 0.6 (H) 07/02/2022 0500   EOSABS 0.9 (H) 08/26/2018 0959   BASOSABS 0.1 07/02/2022 0500   BASOSABS 0.1 08/26/2018 0959     Chest imaging:  PFT:  Labs:  Path:  Echo:  Heart Catheterization:       Assessment & Plan:   No diagnosis found.  Discussion: ***  Immunizations: Immunization History  Administered Date(s) Administered   Influenza,inj,Quad PF,6+ Mos 08/01/2013   Influenza-Unspecified 08/31/2014, 08/09/2015, 05/19/2016, 05/01/2017, 04/21/2018   PPD Test 03/23/2016, 03/30/2016   Pneumococcal Polysaccharide-23 08/01/2013, 03/27/2018   Pneumococcal-Unspecified 08/30/2013, 07/29/2014, 08/31/2014    No current facility-administered medications for this visit. No current outpatient medications on file.  Facility-Administered Medications Ordered in Other Visits:    acetaminophen (TYLENOL) tablet 650 mg, 650 mg, Oral, Q6H PRN, 650 mg at 07/02/22 0734 **OR** acetaminophen (TYLENOL) suppository 650 mg, 650 mg, Rectal, Q6H PRN, Howerter, Justin B, DO   albuterol (PROVENTIL) (2.5 MG/3ML) 0.083% nebulizer  solution 2.5 mg, 2.5 mg, Nebulization, Q6H PRN, Howerter, Justin B, DO, 2.5 mg at 07/02/22 0737   amantadine (SYMMETREL) capsule 100 mg, 100 mg, Oral, Daily, Howerter, Justin B, DO, 100 mg at 07/05/22 7893   aspirin EC tablet 81 mg, 81 mg, Oral, Daily, Skains, Mark C, MD   baclofen (LIORESAL) tablet 10 mg, 10 mg, Oral, TID, Howerter, Justin B, DO, 10 mg at 07/05/22 2127   buPROPion (WELLBUTRIN XL) 24 hr tablet 150 mg, 150 mg, Oral, Daily, Howerter, Justin B, DO, 150 mg at 07/05/22 0920   dextromethorphan-guaiFENesin ([place] DM) 30-600 MG per 12 hr tablet 1 tablet, 1 tablet, Oral, BID, Elodia Florence., MD, 1 tablet at 07/05/22 2127   docusate sodium (COLACE) capsule 100 mg, 100 mg, Oral, BID, Howerter, Justin B, DO, 100 mg at 07/05/22 2127   DULoxetine (CYMBALTA) DR capsule 60 mg, 60 mg, Oral, Daily, Howerter, Justin B, DO, 60 mg at 07/05/22 0920   enoxaparin (LOVENOX) injection 40 mg, 40 mg, Subcutaneous, Q24H, Domenic Polite, MD, 40 mg at 07/05/22 1229   ferrous sulfate tablet 325 mg, 325 mg, Oral, BID WC, Howerter, Justin B, DO, 325 mg at 07/05/22 1656   fluticasone (FLONASE) 50 MCG/ACT nasal spray 1 spray, 1 spray, Each Nare, QHS, Howerter, Justin B, DO   furosemide (LASIX) injection 40 mg, 40 mg, Intravenous, BID, Howerter, Justin B, DO, 40 mg at 07/05/22 1657   gabapentin (NEURONTIN) capsule 600 mg, 600 mg, Oral, TID, Domenic Polite, MD, 600 mg at 07/05/22 2127   insulin  aspart (novoLOG) injection 0-9 Units, 0-9 Units, Subcutaneous, TID WC, Howerter, Justin B, DO, 1 Units at 07/06/22 0641   latanoprost (XALATAN) 0.005 % ophthalmic solution 1 drop, 1 drop, Both Eyes, QHS, Howerter, Justin B, DO, 1 drop at 07/05/22 2126   linaclotide (LINZESS) capsule 290 mcg, 290 mcg, Oral, QAC breakfast, Howerter, Justin B, DO, 290 mcg at 07/05/22 0919   losartan (COZAAR) tablet 12.5 mg, 12.5 mg, Oral, Daily, Lily Kocher, PA-C, 12.5 mg at 07/05/22 0919   magnesium oxide (MAG-OX) tablet 400 mg, 400  mg, Oral, Daily, Elodia Florence., MD, 400 mg at 07/05/22 0920   melatonin tablet 3 mg, 3 mg, Oral, QHS PRN, Howerter, Justin B, DO, 3 mg at 07/03/22 2221   metoprolol succinate (TOPROL-XL) 24 hr tablet 75 mg, 75 mg, Oral, Daily, Elodia Florence., MD, 75 mg at 07/05/22 0919   polyethylene glycol (MIRALAX / GLYCOLAX) packet 17 g, 17 g, Oral, Daily PRN, Howerter, Justin B, DO   potassium chloride SA (KLOR-CON M) CR tablet 20 mEq, 20 mEq, Oral, Daily, Howerter, Justin B, DO, 20 mEq at 07/05/22 0919   senna-docusate (Senokot-S) tablet 2 tablet, 2 tablet, Oral, BID, Howerter, Justin B, DO, 2 tablet at 07/05/22 2127   simvastatin (ZOCOR) tablet 10 mg, 10 mg, Oral, QHS, Howerter, Justin B, DO, 10 mg at 07/05/22 2127   spironolactone (ALDACTONE) tablet 25 mg, 25 mg, Oral, Daily, Weaver, Scott T, PA-C, 25 mg at 07/05/22 0920   traMADol (ULTRAM) tablet 100 mg, 100 mg, Oral, BID PRN, Howerter, Justin B, DO, 100 mg at 07/05/22 [phone]

## 2022-07-06 NOTE — Progress Notes (Addendum)
Rounding Note    Patient Name: Shannon Garrett Date of Encounter: 07/06/2022  [place] Cardiologist: Peter Martinique, MD   Subjective   Patient is very sleepy/lethargic appearing this morning. She denies chest pain, shortness of breath and says that she is "ready to go home."  Inpatient Medications    Scheduled Meds:  amantadine  100 mg Oral Daily   aspirin EC  81 mg Oral Daily   baclofen  10 mg Oral TID   benzonatate  200 mg Oral TID   buPROPion  150 mg Oral Daily   dextromethorphan-guaiFENesin  1 tablet Oral BID   docusate sodium  100 mg Oral BID   DULoxetine  60 mg Oral Daily   enoxaparin (LOVENOX) injection  40 mg Subcutaneous Q24H   ferrous sulfate  325 mg Oral BID WC   fluticasone  1 spray Each Nare QHS   furosemide  40 mg Intravenous BID   gabapentin  600 mg Oral TID   insulin aspart  0-9 Units Subcutaneous TID WC   latanoprost  1 drop Both Eyes QHS   linaclotide  290 mcg Oral QAC breakfast   losartan  12.5 mg Oral Daily   magnesium oxide  400 mg Oral Daily   metoprolol succinate  75 mg Oral Daily   potassium chloride SA  20 mEq Oral Daily   senna-docusate  2 tablet Oral BID   simvastatin  10 mg Oral QHS   spironolactone  25 mg Oral Daily   Continuous Infusions:  PRN Meds: acetaminophen **OR** acetaminophen, albuterol, melatonin, polyethylene glycol, traMADol   Vital Signs    Vitals:   07/05/22 1424 07/05/22 1956 07/06/22 0533 07/06/22 0718  BP:  124/67 125/87 132/76  Pulse:    96  Resp:    16  Temp:  98.4 F (36.9 C) 97.8 F (36.6 C) 98 F (36.7 C)  TempSrc:  Oral Oral Oral  SpO2: 96%   94%  Weight:   96.4 kg   Height:        Intake/Output Summary (Last 24 hours) at 07/06/2022 0859 Last data filed at 07/06/2022 0800 Gross per 24 hour  Intake 600 ml  Output 2700 ml  Net -2100 ml      07/06/2022    5:33 AM 07/05/2022    5:26 AM 07/04/2022    4:21 AM  Last 3 Weights  Weight (lbs) 212 lb 8.4 oz 225 lb 1.4 oz 225 lb 1.4 oz  Weight (kg)  96.4 kg 102.1 kg 102.1 kg      Telemetry    Sinus rhythm, baseline widened QRS with LBBB - Personally Reviewed  ECG    No new tracing  Physical Exam   GEN: No acute distress. Sleepy on exam today. Neck: No JVD Cardiac: RRR, no murmurs, rubs, or gallops.  Respiratory: Upper airway rhonchi. Otherwise clear to auscultation bilaterally. GI: Soft, nontender, non-distended  MS: Trace to 1+ bilateral lower extremity edema. L>R; No deformity. All extremities are slightly cool to touch today. Stable/chronic LUE swelling. Neuro:  Nonfocal  Psych: Normal affect   Labs    High Sensitivity Troponin:   Recent Labs  Lab 07/01/22 1616 07/01/22 1817 07/02/22 0500  TROPONINIHS 18* 19* 20*     Chemistry Recent Labs  Lab 07/01/22 1616 07/01/22 1817 07/02/22 0500 07/03/22 0056 07/04/22 0037 07/05/22 0042 07/06/22 0142  NA 145   < > 144 144 138 142 143  K 4.1   < > 3.5 3.4* 3.7 4.2 4.0  CL 108  --  108 101 98 100 100  CO2 32  --  30 32 28 33* 31  GLUCOSE 110*  --  122* 114* 143* 79 116*  BUN 10  --  '10 10 9 7 9  '$ CREATININE 0.87  --  0.80 0.92 0.67 0.76 0.77  CALCIUM 8.4*  --  8.4* 8.5* 8.3* 8.4* 9.1  MG  --    < > 1.7 1.7 1.6*  --   --   PROT 5.7*  --  5.9* 5.8*  --   --   --   ALBUMIN 2.8*  --  2.9* 2.9*  --   --   --   AST 17  --  16 16  --   --   --   ALT 17  --  17 15  --   --   --   ALKPHOS 126  --  128* 137*  --   --   --   BILITOT 0.5  --  0.4 0.4  --   --   --   GFRNONAA >60  --  >60 >60 >60 >60 >60  ANIONGAP 5  --  '6 11 12 9 12   '$ < > = values in this interval not displayed.    Lipids No results for input(s): "CHOL", "TRIG", "HDL", "LABVLDL", "LDLCALC", "CHOLHDL" in the last 168 hours.  Hematology Recent Labs  Lab 07/02/22 0500 07/03/22 0056 07/04/22 0037  WBC 6.8 5.6 5.8  RBC 3.87 3.88 3.91  HGB 10.9* 10.9* 10.8*  HCT 33.8* 33.1* 34.0*  MCV 87.3 85.3 87.0  MCH 28.2 28.1 27.6  MCHC 32.2 32.9 31.8  RDW 16.2* 16.3* 16.3*  PLT 269 249 249   Thyroid No  results for input(s): "TSH", "FREET4" in the last 168 hours.  BNP Recent Labs  Lab 07/01/22 1617  BNP 3,102.7*    DDimer No results for input(s): "DDIMER" in the last 168 hours.   Radiology    No results found.  Cardiac Studies   07/02/22 TTE   IMPRESSIONS     1. Left ventricular ejection fraction, by estimation, is 20 to 25%. The  left ventricle has severely decreased function. The left ventricle  demonstrates global hypokinesis. The left ventricular internal cavity size  was severely dilated. There is mild  left ventricular hypertrophy. Left ventricular diastolic parameters are  indeterminate.   2. Right ventricular systolic function was not well visualized. The right  ventricular size is not well visualized. Poor RV visualization but appears  grossly normal size and systolic function. There is severely elevated  pulmonary artery systolic  pressure. The estimated right ventricular systolic pressure is 05.3 mmHg.   3. Left atrial size was moderately dilated.   4. The mitral valve is abnormal. Moderate to severe mitral valve  regurgitation. Appears functional   5. Tricuspid valve regurgitation is moderate.   6. The aortic valve was not well visualized. Aortic valve regurgitation  is trivial. No aortic stenosis is present.   7. The inferior vena cava is dilated in size with <50% respiratory  variability, suggesting right atrial pressure of 15 mmHg.   FINDINGS   Left Ventricle: Left ventricular ejection fraction, by estimation, is 20  to 25%. The left ventricle has severely decreased function. The left  ventricle demonstrates global hypokinesis. The left ventricular internal  cavity size was severely dilated.  There is mild left ventricular hypertrophy. Left ventricular diastolic  parameters are indeterminate.   Right Ventricle: The right ventricular size  is not well visualized. Right  vetricular wall thickness was not well visualized. Right ventricular  systolic  function was not well visualized. There is severely elevated  pulmonary artery systolic pressure. The  tricuspid regurgitant velocity is 3.96 m/s, and with an assumed right  atrial pressure of 15 mmHg, the estimated right ventricular systolic  pressure is 07.6 mmHg.   Left Atrium: Left atrial size was moderately dilated.   Right Atrium: Right atrial size was normal in size.   Pericardium: There is no evidence of pericardial effusion.   Mitral Valve: The mitral valve is abnormal. Moderate to severe mitral  valve regurgitation.   Tricuspid Valve: The tricuspid valve is normal in structure. Tricuspid  valve regurgitation is moderate.   Aortic Valve: The aortic valve was not well visualized. Aortic valve  regurgitation is trivial. No aortic stenosis is present. Aortic valve mean  gradient measures 4.0 mmHg. Aortic valve peak gradient measures 7.2 mmHg.  Aortic valve area, by VTI measures  1.60 cm.   Pulmonic Valve: The pulmonic valve was not well visualized. Pulmonic valve  regurgitation is not visualized.   Aorta: The aortic root is normal in size and structure.   Venous: The inferior vena cava is dilated in size with less than 50%  respiratory variability, suggesting right atrial pressure of 15 mmHg.   IAS/Shunts: The interatrial septum was not well visualized.   Patient Profile     Shannon Garrett is a 62 y.o. female with a hx of (HFrEF) heart failure with reduced ejection fraction secondary to non-ischemic cardiomyopathy, hypertension, diabetes mellitus, SVT, multiple sclerosis, adrenal insufficiency, depression, glaucoma, GERD, obesity, aortic atherosclerosis, anemia of chronic disease, intermittent left bundle branch block who is being seen 07/02/2022 for the evaluation of decompensated heart failure at the request of Dr Velia Meyer.   Assessment & Plan    Acute on Chronic heart failure with reduced ejection fraction (HFrEF) Non-ischemic cardiomyopathy   Patient admitted on  07/02/22 with symptoms of acute HF. Reports 20 lbs weight gain. LVEF 35-40% in 2021 and LHC with no CAD.  LVEF on repeat echo this admission shows decrease to 20-25%, global hypokinesis, severe LV dilatation, grossly normal RV systolic function, severely elevated RVSP (77 mmHg), moderate to severe mitral regurgitation (functional), moderate tricuspid regurgitation. BNP elevated to 3102.7. Lactate 1.3.   Patient now net negative 10.33 L following IV lasix '40mg'$  x6 and has improved respiratory status from admission.  Okay to transition to oral diuretic today, Lasix '40mg'$  BID. She is likely to require some degree of PRN dose increases. Improved air movement noted with slowly improving bilateral lower extremity edema. Continue to closely monitor I/O and weight. Daily fluid intake should be limited to <2076m. Outpatient cardiology follow up arranged. Continue GDMT: Spironolactone '25mg'$  Losartan 12.'5mg'$  (decreased with hypotension earlier this admission) Toprol '75mg'$  (reduced in the setting of acute exacerbation) Now that BP has improved, would strongly consider initiation of Entresto. No SGLT2 with chronic foley    Concern for dysphagia   Patient s/p SLP eval on 1/3, found with brisk swallow response and no evidence of aspiration.   Hypertension   Patient's blood pressure well controlled with HF GDMT. Losartan added this admission, reduced to 12.'5mg'$  with low-normal BP. BP now improving. Could consider Entresto.   Elevated troponin   Patient with troponin mildly elevated but flat (18, 19, 20) in the setting of acute CHF. Consistent with demand ischemia. No chest pain today.   Aortic atherosclerosis   Patient on ASA, statin. Appears that  she has been taking '325mg'$  ASA daily, decrease to '81mg'$ .    LUE swelling   Upper extremity doppler negative for DVT.   Per primary team   MS DM Urinary pain     For questions or updates, please contact [place] Please consult [email or web address] for  contact info under        Signed, Lily Kocher, PA-C  07/06/2022, 8:59 AM    Personally seen and examined. Agree with above.  62 year old with nonischemic cardiomyopathy EF 25%.  She states that she is ready to go home.  No significant shortness of breath currently.  Lets continue with her current medication strategy.  As an outpatient, can always consider Entresto.  Continue with losartan at this time.  P.o. Lasix as above makes sense.  I am comfortable with her being discharged at this time from a cardiology perspective.  We will establish follow-up.  Please let us know if we can be of further assistance.  Candee Furbish, MD

## 2022-07-06 NOTE — Care Management Important Message (Signed)
Important Message  Patient Details  Name: Shannon Garrett MRN: [id number] Date of Birth: 05/17/1961   Medicare Important Message Given:  Yes     Shelda Altes 07/06/2022, 8:48 AM

## 2022-07-09 NOTE — Progress Notes (Unsigned)
[facility]  PATIENT: Shannon Garrett DOB: June 16, 1961  REFERRING DOCTOR OR PCP:  Gildardo Cranker SOURCE: Patient, records from EMR, MRI images on PACS.  _________________________________   HISTORICAL  CHIEF COMPLAINT:  No chief complaint on file.   HISTORY OF PRESENT ILLNESS:  I had the pleasure of seeing your patient, Shannon Garrett, at [facility] for neurologic referral regarding her multiple sclerosis.  I last saw her at [facility] neurolog[facility] about 4 years ago.  MS history:   She was diagnosed with multiple sclerosis in the 1980s after presenting with optic neuritis, numbness in her legs and vertigo MRI was consistent with MS. Initially she was not placed on any medication as none were approved. She had multiple exacerbations and by the 1990s was having difficulty with her gait initially she was placed on Avonex and remain on that medication for many years.   She thinks I first saw her about 8 years ago.   She thinks she was switched to Tysabri but continued to have some progression. Therefore, Novantrone was tried.   She feels that Novantrone did slow her progression down significantly. However, this needed to be stopped after she developed congestive heart failure.  She switched her care to Dr. Jacqulynn Cadet and went back on Tysabri therapy   She started Ocrevus in 2017 and switched to Clay in 2020 due to Covid-19 risks but stopped due to elevated LFT.   I last saw her 08/2018.    Gait/strength/sensation: She is unable to walk and has been bedbound for about a year. When I last saw her, she was able to take some steps with a walker and transferred easily however, she did spend most of her time in her electric chair. He has weakness in all extremities, worse on the left associated with spasticity that is also worse on the left.  She denies much numbness and occasionally will get some tingling in the left arm and leg.  Bladder:   She notes bladder spasms  that are only partially helped by oxybutynin. She has urinary incontinence. She wears diapers at her current nursing home.  Vision: She denies any major problem with her vision.  Fatigue/sleep: She feels tired most days. She has difficulty falling asleep and staying asleep. The sleep onset insomnia is worse than the sleep maintenance.  Mood/cognition: She notes depression but no anxiety. She gets frustrated easily. She denies any major problems with cognition but feels she was able to think and process information better in the past.  Pain:  She reports pain in all 4 limbs and in her back. The pain is worse in the left arm. Percocet helped mildly.  I personally reviewed MRI images of the brain dated 10/08/2014 and 09/06/2014 and MRI cervical spine 10/08/2014.    I concur with the official readings.  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     REVIEW OF SYSTEMS: Constitutional: No fevers, chills, sweats, or change in appetite.  Notes fatigue and insomnia Eyes: No visual changes, double vision, eye pain Ear, nose and throat: No hearing loss, ear pain, nasal congestion, sore throat Cardiovascular: No chest pain, palpitations Respiratory:  No shortness of breath at rest or with exertion.   No wheezes GastrointestinaI: No nausea, vomiting, diarrhea, abdominal pain, fecal incontinence Genitourinary:  as above Musculoskeletal:  Some neck pain, back pain Integumentary: No rash, pruritus, skin lesions Neurological: as above Psychiatric: as  above Endocrine: No palpitations, diaphoresis, change in appetite, change in weigh or increased thirst Hematologic/Lymphatic:  No anemia, purpura, petechiae. Allergic/Immunologic: No itchy/runny eyes, nasal congestion, recent allergic reactions, rashes  ALLERGIES: Allergies  Allergen Reactions   Sulfonamide Derivatives Hives and Shortness Of Breath   Penicillins Hives and  Itching    Prior course of rocephin charted 05/2015 Also note has tolerated Zosyn in past 2016 and 2017    HOME MEDICATIONS:  Current Outpatient Medications:    acetaminophen (TYLENOL) 325 MG tablet, Take 650 mg by mouth every 8 (eight) hours as needed (pain, fever > 100.5)., Disp: , Rfl:    albuterol (PROVENTIL) (2.5 MG/3ML) 0.083% nebulizer solution, Take 2.5 mg by nebulization every 6 (six) hours as needed for wheezing or shortness of breath., Disp: , Rfl:    albuterol (VENTOLIN HFA) 108 (90 Base) MCG/ACT inhaler, Inhale 2 puffs into the lungs every 6 (six) hours as needed for wheezing or shortness of breath. , Disp: , Rfl:    amantadine (SYMMETREL) 100 MG capsule, Take 100 mg by mouth in the morning., Disp: , Rfl:    ascorbic acid (VITAMIN C) 500 MG tablet, Take 500 mg by mouth in the morning., Disp: , Rfl:    aspirin EC 81 MG tablet, Take 1 tablet (81 mg total) by mouth daily., Disp: 30 tablet, Rfl: 0   AZO-CRANBERRY PO, Take 2 each by mouth in the morning., Disp: , Rfl:    baclofen (LIORESAL) 10 MG tablet, Take 10 mg by mouth 3 (three) times daily. Hold if lethargic or confused, Disp: , Rfl:    benzonatate (TESSALON) 200 MG capsule, Take 1 capsule (200 mg total) by mouth 3 (three) times daily for 3 days., Disp: 9 capsule, Rfl: 0   bisacodyl (DULCOLAX) 10 MG suppository, Place 10 mg rectally daily as needed for moderate constipation. If no results for MOM, Disp: , Rfl:    buPROPion (WELLBUTRIN XL) 150 MG 24 hr tablet, Take 150 mg by mouth in the morning., Disp: , Rfl:    dextromethorphan-guaiFENesin (MUCINEX DM) 30-600 MG 12hr tablet, Take 1 tablet by mouth 2 (two) times daily for 3 days., Disp: 6 tablet, Rfl: 0   DULoxetine (CYMBALTA) 20 MG capsule, Take 60 mg by mouth in the morning., Disp: , Rfl:    Ensure (ENSURE), Take 237 mLs by mouth 3 (three) times daily. (Patient not taking: Reported on 07/02/2022), Disp: , Rfl:    ferrous sulfate 325 (65 FE) MG tablet, Take 325 mg by mouth 2 (two)  times daily with a meal. , Disp: , Rfl:    fluticasone (FLONASE) 50 MCG/ACT nasal spray, Place 1 spray into both nostrils at bedtime., Disp: , Rfl:    furosemide (LASIX) 40 MG tablet, Take 1 tablet (40 mg total) by mouth 2 (two) times daily., Disp: , Rfl:    gabapentin (NEURONTIN) 600 MG tablet, Take 1 tablet (600 mg total) by mouth 3 (three) times daily., Disp: , Rfl:    insulin aspart (NOVOLOG FLEXPEN) 100 UNIT/ML FlexPen, 0-15 Units, Subcutaneous, 3 times daily with meals CBG < 70: implement hypoglycemia protocol-call MD CBG 70 - 120: 0 units CBG 121 - 150: 2 units CBG 151 - 200: 3 units CBG 201 - 250: 5 units CBG 251 - 300: 8 units CBG 301 - 350: 11 units CBG 351 - 400: 15 units CBG > 400: (Patient taking differently: Inject 0-15 Units into the skin 3 (three) times daily with meals. 0-15 Units, Subcutaneous, 3 times daily with meals  per sliding scale : CBG < 70: implement hypoglycemia protocol-call MD CBG 0 - 120 : 0 units CBG 121 - 150: 2 units CBG 151 - 200: 3 units CBG 201 - 250: 5 units CBG 251 - 300: 8 units CBG 301 - 350: 11 units CBG 351 - 400: 15 units CBG >400, CALL MD), Disp: 15 mL, Rfl: 0   ipratropium-albuterol (DUONEB) 0.5-2.5 (3) MG/3ML SOLN, Inhale 3 mLs into the lungs every 8 (eight) hours as needed (wheezing, shortness of breath)., Disp: , Rfl:    latanoprost (XALATAN) 0.005 % ophthalmic solution, Place 1 drop into both eyes at bedtime., Disp: , Rfl:    linaclotide (LINZESS) 290 MCG CAPS capsule, Take 290 mcg by mouth daily before breakfast., Disp: , Rfl:    losartan (COZAAR) 25 MG tablet, Take 1 tablet (25 mg total) by mouth daily., Disp: , Rfl:    magnesium oxide (MAG-OX) 400 (240 Mg) MG tablet, Take 1 tablet (400 mg total) by mouth daily., Disp: , Rfl:    Melatonin 5 MG TABS, Take 10 mg by mouth at bedtime. , Disp: , Rfl:    metoprolol succinate (TOPROL-XL) 50 MG 24 hr tablet, Take 1.5 tablets (75 mg total) by mouth in the morning., Disp: , Rfl:    Multiple Vitamins-Minerals  (MULTIVITAMIN ADULT PO), Take 1 tablet by mouth in the morning., Disp: , Rfl:    NONFORMULARY OR COMPOUNDED ITEM, Apply 1 application  topically 3 (three) times daily. A&D/Balmex/Lidocaine 1:1:1. Apply to vulva 3 times daily., Disp: , Rfl:    nystatin (MYCOSTATIN/NYSTOP) powder, Apply 1 Application topically 2 (two) times daily. To vagina, armpits, Disp: , Rfl:    ondansetron (ZOFRAN) 4 MG tablet, Take 4 mg by mouth every 6 (six) hours as needed for nausea or vomiting., Disp: , Rfl:    OXYGEN, Inhale 2-4 L into the lungs as needed (O2 sats <93%)., Disp: , Rfl:    polyethylene glycol (MIRALAX MIX-IN PAX) 17 g packet, Take 17 g by mouth daily. (Patient taking differently: Take 17 g by mouth in the morning.), Disp: 14 each, Rfl: 0   Potassium Chloride ER 20 MEQ TBCR, Take 20 mEq by mouth in the morning., Disp: , Rfl:    Probiotic Product (PROBIOTIC PO), Take 1 capsule by mouth 2 (two) times daily., Disp: , Rfl:    senna-docusate (SENOKOT-S) 8.6-50 MG tablet, Take 2 tablets by mouth 2 (two) times daily., Disp: , Rfl:    simethicone (MYLICON) 376 MG chewable tablet, Chew 125 mg by mouth every 6 (six) hours as needed for flatulence., Disp: , Rfl:    simvastatin (ZOCOR) 10 MG tablet, Take 10 mg by mouth at bedtime., Disp: , Rfl:    spironolactone (ALDACTONE) 25 MG tablet, Take 25 mg by mouth in the morning., Disp: , Rfl:    traMADol (ULTRAM) 50 MG tablet, Take 2 tablets (100 mg total) by mouth 2 (two) times daily as needed (pain)., Disp: 10 tablet, Rfl: 0  PAST MEDICAL HISTORY: Past Medical History:  Diagnosis Date   Adrenal insufficiency ([place])    Anemia of chronic disease    Aortic atherosclerosis ([place])    COVID-19 05/10/2019   Per facility paperwork   Decubitus ulcer    stage II sacral decubitus ulcer.    Depression    DM2 (diabetes mellitus, type 2) ([place])    Glaucoma    Glaucoma    HFrEF (heart failure with reduced ejection fraction) ([place])    TTE 04/2012: EF 20-25 // TTE 08/2012:  EF 50-55 //  Cath 10/19/19: Normal Cors // TTE 05/20/20: EF 35-40, trivial MR, mild AI   Hypertension    MS (multiple sclerosis) ([place])    a. Dx'd late 20's. b. Tx with Novantrone, Tysabri, Copaxone previously.   NICM (nonischemic cardiomyopathy) ([place])    cath 10/2019: normal cors // probably due to multiple chemo-Tx agents used for MS   SVT (supraventricular tachycardia)     PAST SURGICAL HISTORY: Past Surgical History:  Procedure Laterality Date   ABLATION     uterine   BILIARY DILATION  02/15/2020   Procedure: BILIARY DILATION;  Surgeon: Rush Landmark Telford Nab., MD;  Location: [facility];  Service: Gastroenterology;;   CESAREAN SECTION     CHOLECYSTECTOMY N/A 07/20/2019   Procedure: DIAGNOSTIC LAPAROSCOPY;  Surgeon: [name], MD;  Location: [facility];  Service: General;  Laterality: N/A;   CHOLECYSTECTOMY N/A 02/12/2020   Procedure: LAPAROSCOPIC CHOLECYSTECTOMY;  Surgeon: Clovis Riley, MD;  Location: [facility];  Service: General;  Laterality: N/A;   ERCP N/A 02/15/2020   Procedure: ENDOSCOPIC RETROGRADE CHOLANGIOPANCREATOGRAPHY (ERCP);  Surgeon: Irving Copas., MD;  Location: [facility];  Service: Gastroenterology;  Laterality: N/A;   IR EXCHANGE BILIARY DRAIN  09/07/2019   IR PERC CHOLECYSTOSTOMY  07/21/2019   LEFT HEART CATH AND CORONARY ANGIOGRAPHY N/A 10/29/2019   Procedure: LEFT HEART CATH AND CORONARY ANGIOGRAPHY;  Surgeon: Leonie Man, MD;  Location: [facility];  Service: Cardiovascular;  Laterality: N/A;   REMOVAL OF STONES  02/15/2020   Procedure: REMOVAL OF STONES;  Surgeon: Rush Landmark Telford Nab., MD;  Location: [facility];  Service: Gastroenterology;;   Joan Mayans  02/15/2020   Procedure: Joan Mayans;  Surgeon: Mansouraty, Telford Nab., MD;  Location: [facility];  Service: Gastroenterology;;    FAMILY HISTORY: Family History  Problem Relation Age of Onset   Hypertension Mother    Cancer Mother        breast    Cancer Father        prostate    Multiple sclerosis Sister    Heart attack Neg Hx     SOCIAL HISTORY:  Social History   Socioeconomic History   Marital status: Single    Spouse name: Not on file   Number of children: Not on file   Years of education: Not on file   Highest education level: Not on file  Occupational History   Not on file  Tobacco Use   Smoking status: Former    Packs/day: 0.50    Years: 5.00    Total pack years: 2.50    Types: Cigarettes   Smokeless tobacco: Never  Vaping Use   Vaping Use: Never used  Substance and Sexual Activity   Alcohol use: No   Drug use: No   Sexual activity: Not Currently  Other Topics Concern   Not on file  Social History Narrative   Not on file   Social Determinants of Health   Financial Resource Strain: Low Risk  (02/13/2018)   Overall Financial Resource Strain (CARDIA)    Difficulty of Paying Living Expenses: Not hard at all  Food Insecurity: No Food Insecurity (07/02/2022)   Hunger Vital Sign    Worried About Running Out of Food in the Last Year: Never true    Ran Out of Food in the Last Year: Never true  Transportation Needs: No Transportation Needs (07/02/2022)   PRAPARE - Hydrologist (Medical): No    Lack of Transportation (Non-Medical): No  Physical Activity: Inactive (02/13/2018)   Exercise Vital Sign    Days of Exercise per Week: 0 days    Minutes of Exercise per Session: 0 min  Stress: No Stress Concern Present (02/13/2018)   [place]    Feeling of Stress : Only a little  Social Connections: Moderately Isolated (02/13/2018)   Social Connection and Isolation Panel [NHANES]    Frequency of Communication with Friends and Family: More than three times a week    Frequency of Social Gatherings with Friends and Family: Twice a week    Attends Religious Services: Never    Marine scientist or Organizations: No    Attends Archivist Meetings:  Never    Marital Status: Never married  Intimate Partner Violence: Not At Risk (07/02/2022)   Humiliation, Afraid, Rape, and Kick questionnaire    Fear of Current or Ex-Partner: No    Emotionally Abused: No    Physically Abused: No    Sexually Abused: No     PHYSICAL EXAM  There were no vitals filed for this visit.   There is no height or weight on file to calculate BMI.   General: The patient is well-developed and well-nourished and in no acute distress  Eyes:  Funduscopic exam shows normal optic discs and retinal vessels.  Neck: The neck is supple, no carotid bruits are noted.  The neck is nontender.  Cardiovascular: The heart has a regular rate and rhythm with a normal S1 and S2. There were no murmurs, gallops or rubs.    Skin: Extremities show minimal ankle edema.    Neurologic Exam  Mental status: The patient is alert and oriented x 3 at the time of the examination. The patient has apparent normal recent and remote memory, with an apparently normal attention span and concentration ability.   Speech is normal.  Cranial nerves: Extraocular movements are full. Pupils are equal, round, and reactive to light and accomodation.  Visual fields are full.  Facial symmetry is present. There is good facial sensation to soft touch bilaterally.Facial strength is normal.  Trapezius and sternocleidomastoid strength is normal. No dysarthria is noted.  The tongue is midline, and the patient has symmetric elevation of the soft palate. No obvious hearing deficits are noted.  Motor:  Muscle bulk is normal.   Tone is increase in the legs greater than the arms, left worse than right. Ankle is 4+/5 in the right arm, 4 minus/5 in the left arm, 2/5 in the right leg and 2 minus/5 in the left leg..   Sensory: Sensory testing is intact to touch and vibration sensation in all 4 extremities.  Coordination: Cerebellar testing reveals good left and poor right finger-nose-finger bilaterally.  Gait and  station: she is bedridden.   Reflexes: Deep tendon reflexes are increased, left more than right.    DIAGNOSTIC DATA (LABS, IMAGING, TESTING) - I reviewed patient records, labs, notes, testing and imaging myself where available.  Lab Results  Component Value Date   WBC 5.8 07/04/2022   HGB 10.8 (L) 07/04/2022   HCT 34.0 (L) 07/04/2022   MCV 87.0 07/04/2022   PLT 249 07/04/2022      Component Value Date/Time   NA 143 07/06/2022 0142   NA 144 10/26/2019 1100   K 4.0 07/06/2022 0142   CL 100 07/06/2022 0142   CO2 31 07/06/2022 0142   GLUCOSE 116 (H) 07/06/2022 0142   BUN 9 07/06/2022 0142  BUN 21 10/26/2019 1100   CREATININE 0.77 07/06/2022 0142   CALCIUM 9.1 07/06/2022 0142   PROT 5.8 (L) 07/03/2022 0056   PROT 6.4 08/26/2018 0959   ALBUMIN 2.9 (L) 07/03/2022 0056   ALBUMIN 3.9 08/26/2018 0959   AST 16 07/03/2022 0056   ALT 15 07/03/2022 0056   ALKPHOS 137 (H) 07/03/2022 0056   BILITOT 0.4 07/03/2022 0056   BILITOT 0.2 08/26/2018 0959   GFRNONAA >60 07/06/2022 0142   GFRAA >60 02/20/2020 1415   Lab Results  Component Value Date   CHOL 183 12/10/2017   HDL 31 (A) 12/10/2017   LDLCALC 98 12/10/2017   TRIG 273 (H) 05/10/2019   CHOLHDL 6.3 05/01/2012   Lab Results  Component Value Date   HGBA1C 5.8 (H) 07/01/2022   Lab Results  Component Value Date   VITAMINB12 2,449 (H) 07/10/2015   Lab Results  Component Value Date   TSH 2.526 05/10/2019       ASSESSMENT AND PLAN  No diagnosis found.   In summary, Cordell Coke is a 53 year oldwoman with a relapsing form of secondary progressive MS with multiple neurologic impairments as detailed above. I had her sign a service request form and we will try to get her back on Tysabri therapy. To help with her spasticity and pain, I will increase the baclofen to 20 mg by mouth 3 times a day and increase her Percocet pills from 7.5 to 10. Additionally, she has a lot of insomnia and I will increase the Ambien to 10 mg.  I'll ask her nursing home to get her out of bed to a Geri chair daily.  She will return in about 4 months or sooner if she has new or worsening neurologic symptoms.   Genecis Veley A. Felecia Shelling, MD, PhD 01/05/7208, 4:70 PM Certified in Neurology, Clinical Neurophysiology, Sleep Medicine, Pain Medicine and Neuroimaging  [facility] [address] [place], [place] [address] [phone]

## 2022-07-12 ENCOUNTER — Encounter: Payer: Self-pay | Admitting: Neurology

## 2022-07-12 ENCOUNTER — Ambulatory Visit (INDEPENDENT_AMBULATORY_CARE_PROVIDER_SITE_OTHER): Payer: 59 | Admitting: Neurology

## 2022-07-12 VITALS — BP 125/76 | HR 79

## 2022-07-12 DIAGNOSIS — G8250 Quadriplegia, unspecified: Secondary | ICD-10-CM | POA: Diagnosis not present

## 2022-07-12 DIAGNOSIS — I5032 Chronic diastolic (congestive) heart failure: Secondary | ICD-10-CM | POA: Diagnosis not present

## 2022-07-12 DIAGNOSIS — N319 Neuromuscular dysfunction of bladder, unspecified: Secondary | ICD-10-CM

## 2022-07-12 DIAGNOSIS — G35 Multiple sclerosis: Secondary | ICD-10-CM | POA: Diagnosis not present

## 2022-07-12 DIAGNOSIS — G894 Chronic pain syndrome: Secondary | ICD-10-CM

## 2022-07-16 NOTE — Progress Notes (Signed)
Cardiology Clinic Note   Patient Name: Shannon Garrett Date of Encounter: 07/18/2022  Primary Care Provider:  Pcp, No Primary Cardiologist:  Peter Martinique, MD  Patient Profile    Shannon Garrett 62 year old female presents to the clinic today for follow-up evaluation of her chronic diastolic CHF and dilated cardiomyopathy.  Past Medical History    Past Medical History:  Diagnosis Date   Adrenal insufficiency ([place])    Anemia of chronic disease    Aortic atherosclerosis ([place])    COVID-19 05/10/2019   Per facility paperwork   Decubitus ulcer    stage II sacral decubitus ulcer.    Depression    DM2 (diabetes mellitus, type 2) ([place])    Glaucoma    Glaucoma    HFrEF (heart failure with reduced ejection fraction) ([place])    TTE 04/2012: EF 20-25 // TTE 08/2012: EF 50-55 // Cath 10/19/19: Normal Cors // TTE 05/20/20: EF 35-40, trivial MR, mild AI   Hypertension    MS (multiple sclerosis) ([place])    a. Dx'd late 20's. b. Tx with Novantrone, Tysabri, Copaxone previously.   NICM (nonischemic cardiomyopathy) ([place])    cath 10/2019: normal cors // probably due to multiple chemo-Tx agents used for MS   SVT (supraventricular tachycardia)    Past Surgical History:  Procedure Laterality Date   ABLATION     uterine   BILIARY DILATION  02/15/2020   Procedure: BILIARY DILATION;  Surgeon: Rush Landmark Telford Nab., MD;  Location: [facility];  Service: Gastroenterology;;   CESAREAN SECTION     CHOLECYSTECTOMY N/A 07/20/2019   Procedure: DIAGNOSTIC LAPAROSCOPY;  Surgeon: [name], MD;  Location: [facility];  Service: General;  Laterality: N/A;   CHOLECYSTECTOMY N/A 02/12/2020   Procedure: LAPAROSCOPIC CHOLECYSTECTOMY;  Surgeon: Clovis Riley, MD;  Location: [facility];  Service: General;  Laterality: N/A;   ERCP N/A 02/15/2020   Procedure: ENDOSCOPIC RETROGRADE CHOLANGIOPANCREATOGRAPHY (ERCP);  Surgeon: Irving Copas., MD;  Location: [facility];  Service: Gastroenterology;  Laterality: N/A;    IR EXCHANGE BILIARY DRAIN  09/07/2019   IR PERC CHOLECYSTOSTOMY  07/21/2019   LEFT HEART CATH AND CORONARY ANGIOGRAPHY N/A 10/29/2019   Procedure: LEFT HEART CATH AND CORONARY ANGIOGRAPHY;  Surgeon: Leonie Man, MD;  Location: [facility];  Service: Cardiovascular;  Laterality: N/A;   REMOVAL OF STONES  02/15/2020   Procedure: REMOVAL OF STONES;  Surgeon: Irving Copas., MD;  Location: [facility];  Service: Gastroenterology;;   Joan Mayans  02/15/2020   Procedure: Joan Mayans;  Surgeon: Irving Copas., MD;  Location: [facility];  Service: Gastroenterology;;    Allergies  Allergies  Allergen Reactions   Sulfonamide Derivatives Hives and Shortness Of Breath   Penicillins Hives and Itching    Prior course of rocephin charted 05/2015 Also note has tolerated Zosyn in past 2016 and 2017    History of Present Illness    [name] has a PMH of hypertensive heart disease, chronic diastolic CHF, dilated cardiomyopathy, essential hypertension, SVT, cholecystitis, GERD, chronic constipation, adrenal insufficiency (Addison's disease), type 2 diabetes, obesity, pulmonary hypertension, advanced multiple sclerosis, bed/wheelchair bound and chronic pain syndrome.  She is a resident of skilled nursing.  Echocardiogram, 07/02/2022, showed an EF of 20-25%, severely elevated PA pressures and her RV could not be assessed.  She presented to the emergency department on 07/01/2022 and was discharged on 07/06/2022.  She was noted to have acute on chronic systolic heart failure.  She was short of breath.  She was  diuresed 11 L and transition to oral furosemide 40 mg twice daily.  Her spironolactone, losartan and metoprolol were continued.  She felt to be a poor candidate for SGLT2 with advanced MS and suprapubic catheter.  Appointment made for discharge back to SNF and recommendation was made for palliative care eval at facility.  She was noted to have poor functional status.  She  presents to the clinic today for follow-up evaluation and states she feels okay today.  She continues on 2 L of oxygen nasal cannula.  She is saturating at 99%.  Her blood pressure today is 132/86.  We reviewed her recent emergency room visit and hospitalization.  She expressed understanding.  She presents with a facility aide.  I reviewed her medication list.  She is currently taking furosemide 20 mg twice daily and has a as needed dose of 30 mg for CHF symptoms.  She is limited in her mobility due to her MS.  She is wheelchair-bound.  She appears well compensated and stable from a cardiac standpoint.  I will continue her current medication regimen, encouraged daily weights, ordered a BMP and we will plan follow-up in 3 to 4 months.  Today she denies chest pain, shortness of breath, lower extremity edema, fatigue, palpitations, melena, hematuria, hemoptysis, diaphoresis, weakness, presyncope, syncope, orthopnea, and PND.    Home Medications    Prior to Admission medications   Medication Sig Start Date End Date Taking? Authorizing Provider  acetaminophen (TYLENOL) 325 MG tablet Take 650 mg by mouth every 8 (eight) hours as needed (pain, fever > 100.5).    [provider]  albuterol (PROVENTIL) (2.5 MG/3ML) 0.083% nebulizer solution Take 2.5 mg by nebulization every 6 (six) hours as needed for wheezing or shortness of breath.    [provider]  albuterol (VENTOLIN HFA) 108 (90 Base) MCG/ACT inhaler Inhale 2 puffs into the lungs every 6 (six) hours as needed for wheezing or shortness of breath.  07/10/19   [provider]  amantadine (SYMMETREL) 100 MG capsule Take 100 mg by mouth in the morning.    [provider]  ascorbic acid (VITAMIN C) 500 MG tablet Take 500 mg by mouth in the morning.    [provider]  aspirin EC 81 MG tablet Take 1 tablet (81 mg total) by mouth daily. 07/06/22   Domenic Polite, MD  AZO-CRANBERRY PO Take 2 each by mouth in the  morning.    [provider]  baclofen (LIORESAL) 10 MG tablet Take 10 mg by mouth 3 (three) times daily. Hold if lethargic or confused    [provider]  bisacodyl (DULCOLAX) 10 MG suppository Place 10 mg rectally daily as needed for moderate constipation. If no results for MOM    [provider]  buPROPion (WELLBUTRIN XL) 150 MG 24 hr tablet Take 150 mg by mouth in the morning.    [provider]  DULoxetine (CYMBALTA) 20 MG capsule Take 60 mg by mouth in the morning.    [provider]  Ensure (ENSURE) Take 237 mLs by mouth 3 (three) times daily.    [provider]  ferrous sulfate 325 (65 FE) MG tablet Take 325 mg by mouth 2 (two) times daily with a meal.  02/20/18   [provider]  fluticasone (FLONASE) 50 MCG/ACT nasal spray Place 1 spray into both nostrils at bedtime.    [provider]  furosemide (LASIX) 40 MG tablet Take 1 tablet (40 mg total) by mouth 2 (two)  times daily. 07/06/22   Domenic Polite, MD  gabapentin (NEURONTIN) 600 MG tablet Take 1 tablet (600 mg total) by mouth 3 (three) times daily. 07/06/22   Domenic Polite, MD  insulin aspart (NOVOLOG FLEXPEN) 100 UNIT/ML FlexPen 0-15 Units, Subcutaneous, 3 times daily with meals CBG < 70: implement hypoglycemia protocol-call MD CBG 70 - 120: 0 units CBG 121 - 150: 2 units CBG 151 - 200: 3 units CBG 201 - 250: 5 units CBG 251 - 300: 8 units CBG 301 - 350: 11 units CBG 351 - 400: 15 units CBG > 400: Patient taking differently: Inject 0-15 Units into the skin 3 (three) times daily with meals. 0-15 Units, Subcutaneous, 3 times daily with meals per sliding scale : CBG < 70: implement hypoglycemia protocol-call MD CBG 0 - 120 : 0 units CBG 121 - 150: 2 units CBG 151 - 200: 3 units CBG 201 - 250: 5 units CBG 251 - 300: 8 units CBG 301 - 350: 11 units CBG 351 - 400: 15 units CBG >400, CALL MD 05/18/19   Jonetta Osgood, MD  ipratropium-albuterol (DUONEB)  0.5-2.5 (3) MG/3ML SOLN Inhale 3 mLs into the lungs every 8 (eight) hours as needed (wheezing, shortness of breath).    [provider]  latanoprost (XALATAN) 0.005 % ophthalmic solution Place 1 drop into both eyes at bedtime.    [provider]  linaclotide (LINZESS) 290 MCG CAPS capsule Take 290 mcg by mouth daily before breakfast.    [provider]  losartan (COZAAR) 25 MG tablet Take 1 tablet (25 mg total) by mouth daily. 07/07/22   Domenic Polite, MD  magnesium oxide (MAG-OX) 400 (240 Mg) MG tablet Take 1 tablet (400 mg total) by mouth daily. 07/07/22   Domenic Polite, MD  Melatonin 5 MG TABS Take 10 mg by mouth at bedtime.  11/14/17   [provider]  metoprolol succinate (TOPROL-XL) 50 MG 24 hr tablet Take 1.5 tablets (75 mg total) by mouth in the morning. 07/06/22   Domenic Polite, MD  Multiple Vitamins-Minerals (MULTIVITAMIN ADULT PO) Take 1 tablet by mouth in the morning.    [provider]  NONFORMULARY OR COMPOUNDED ITEM Apply 1 application  topically 3 (three) times daily. A&D/Balmex/Lidocaine 1:1:1. Apply to vulva 3 times daily.    [provider]  nystatin (MYCOSTATIN/NYSTOP) powder Apply 1 Application topically 2 (two) times daily. To vagina, armpits    [provider]  ondansetron (ZOFRAN) 4 MG tablet Take 4 mg by mouth every 6 (six) hours as needed for nausea or vomiting. 11/01/17   [provider]  OXYGEN Inhale 2-4 L into the lungs as needed (O2 sats <93%).    [provider]  polyethylene glycol (MIRALAX MIX-IN PAX) 17 g packet Take 17 g by mouth daily. Patient taking differently: Take 17 g by mouth in the morning. 05/21/20   Donne Hazel, MD  Potassium Chloride ER 20 MEQ TBCR Take 20 mEq by mouth in the morning. 05/17/20   [provider]  Probiotic Product (PROBIOTIC PO) Take 1 capsule by mouth 2 (two) times daily.    [provider]  senna-docusate (SENOKOT-S) 8.6-50 MG tablet Take  2 tablets by mouth 2 (two) times daily.    [provider]  simethicone (MYLICON) 409 MG chewable tablet Chew 125 mg by mouth every 6 (six) hours as needed for flatulence.    [provider]  simvastatin (ZOCOR) 10 MG tablet Take 10 mg by mouth at bedtime.  [provider]  spironolactone (ALDACTONE) 25 MG tablet Take 25 mg by mouth in the morning. 05/02/20   [provider]  traMADol (ULTRAM) 50 MG tablet Take 2 tablets (100 mg total) by mouth 2 (two) times daily as needed (pain). 07/06/22   Domenic Polite, MD    Family History    Family History  Problem Relation Age of Onset   Hypertension Mother    Cancer Mother        breast    Cancer Father        prostate   Multiple sclerosis Sister    Heart attack Neg Hx    She indicated that her mother is alive. She indicated that her father is deceased. She indicated that the status of her sister is unknown. She indicated that the status of her neg hx is unknown.  Social History    Social History   Socioeconomic History   Marital status: Single    Spouse name: Not on file   Number of children: Not on file   Years of education: Not on file   Highest education level: Not on file  Occupational History   Not on file  Tobacco Use   Smoking status: Former    Packs/day: 0.50    Years: 5.00    Total pack years: 2.50    Types: Cigarettes   Smokeless tobacco: Never  Vaping Use   Vaping Use: Never used  Substance and Sexual Activity   Alcohol use: No   Drug use: No   Sexual activity: Not Currently  Other Topics Concern   Not on file  Social History Narrative   Not on file   Social Determinants of Health   Financial Resource Strain: Low Risk  (02/13/2018)   Overall Financial Resource Strain (CARDIA)    Difficulty of Paying Living Expenses: Not hard at all  Food Insecurity: No Food Insecurity (07/02/2022)   Hunger Vital Sign    Worried About Running Out of Food in the Last Year: Never true    [place] in the Last Year: Never true  Transportation Needs: No Transportation Needs (07/02/2022)   PRAPARE - Hydrologist (Medical): No    Lack of Transportation (Non-Medical): No  Physical Activity: Inactive (02/13/2018)   Exercise Vital Sign    Days of Exercise per Week: 0 days    Minutes of Exercise per Session: 0 min  Stress: No Stress Concern Present (02/13/2018)   [place]    Feeling of Stress : Only a little  Social Connections: Moderately Isolated (02/13/2018)   Social Connection and Isolation Panel [NHANES]    Frequency of Communication with Friends and Family: More than three times a week    Frequency of Social Gatherings with Friends and Family: Twice a week    Attends Religious Services: Never    Marine scientist or Organizations: No    Attends Archivist Meetings: Never    Marital Status: Never married  Intimate Partner Violence: Not At Risk (07/02/2022)   Humiliation, Afraid, Rape, and Kick questionnaire    Fear of Current or Ex-Partner: No    Emotionally Abused: No    Physically Abused: No    Sexually Abused: No     Review of Systems    General:  No chills, fever, night sweats or weight changes.  Cardiovascular:  No chest pain, dyspnea on exertion, edema, orthopnea, palpitations, paroxysmal  nocturnal dyspnea. Dermatological: No rash, lesions/masses Respiratory: No cough, dyspnea Urologic: No hematuria, dysuria Abdominal:   No nausea, vomiting, diarrhea, bright red blood per rectum, melena, or hematemesis Neurologic:  No visual changes, wkns, changes in mental status. All other systems reviewed and are otherwise negative except as noted above.  Physical Exam    VS:  BP 132/86 (BP Location: Right Arm, Patient Position: Sitting, Cuff Size: Normal) Comment (BP Location): forearm  Pulse 64   Ht '5\' 5"'$  (1.651 m)   SpO2 99%   BMI 35.37 kg/m  , BMI Body mass  index is 35.37 kg/m. GEN: Well nourished, well developed, in no acute distress. HEENT: normal. Neck: Supple, no JVD, carotid bruits, or masses. Cardiac: RRR, no murmurs, rubs, or gallops. No clubbing, cyanosis, bilateral lower extremity generalized nonpitting edema.  Radials/DP/PT 2+ and equal bilaterally.  Respiratory:  Respirations regular and unlabored, clear to auscultation bilaterally. GI: Soft, nontender, nondistended, BS + x 4. MS: no deformity or atrophy. Skin: warm and dry, no rash. Neuro:  Strength and sensation are intact. Psych: Normal affect.  Accessory Clinical Findings    Recent Labs: 07/01/2022: B Natriuretic Peptide 3,102.7 07/03/2022: ALT 15 07/04/2022: Hemoglobin 10.8; Magnesium 1.6; Platelets 249 07/06/2022: BUN 9; Creatinine, Ser 0.77; Potassium 4.0; Sodium 143   Recent Lipid Panel    Component Value Date/Time   CHOL 183 12/10/2017 0000   TRIG 273 (H) 05/10/2019 1452   HDL 31 (A) 12/10/2017 0000   CHOLHDL 6.3 05/01/2012 0500   VLDL 14 05/01/2012 0500   LDLCALC 98 12/10/2017 0000         ECG personally reviewed by me today-none today.  Echocardiogram 07/02/2022  IMPRESSIONS     1. Left ventricular ejection fraction, by estimation, is 20 to 25%. The  left ventricle has severely decreased function. The left ventricle  demonstrates global hypokinesis. The left ventricular internal cavity size  was severely dilated. There is mild  left ventricular hypertrophy. Left ventricular diastolic parameters are  indeterminate.   2. Right ventricular systolic function was not well visualized. The right  ventricular size is not well visualized. Poor RV visualization but appears  grossly normal size and systolic function. There is severely elevated  pulmonary artery systolic  pressure. The estimated right ventricular systolic pressure is 41.9 mmHg.   3. Left atrial size was moderately dilated.   4. The mitral valve is abnormal. Moderate to severe mitral valve   regurgitation. Appears functional   5. Tricuspid valve regurgitation is moderate.   6. The aortic valve was not well visualized. Aortic valve regurgitation  is trivial. No aortic stenosis is present.   7. The inferior vena cava is dilated in size with <50% respiratory  variability, suggesting right atrial pressure of 15 mmHg.   FINDINGS   Left Ventricle: Left ventricular ejection fraction, by estimation, is 20  to 25%. The left ventricle has severely decreased function. The left  ventricle demonstrates global hypokinesis. The left ventricular internal  cavity size was severely dilated.  There is mild left ventricular hypertrophy. Left ventricular diastolic  parameters are indeterminate.   Right Ventricle: The right ventricular size is not well visualized. Right  vetricular wall thickness was not well visualized. Right ventricular  systolic function was not well visualized. There is severely elevated  pulmonary artery systolic pressure. The  tricuspid regurgitant velocity is 3.96 m/s, and with an assumed right  atrial pressure of 15 mmHg, the estimated right ventricular systolic  pressure is 37.9 mmHg.   Left Atrium:  Left atrial size was moderately dilated.   Right Atrium: Right atrial size was normal in size.   Pericardium: There is no evidence of pericardial effusion.   Mitral Valve: The mitral valve is abnormal. Moderate to severe mitral  valve regurgitation.   Tricuspid Valve: The tricuspid valve is normal in structure. Tricuspid  valve regurgitation is moderate.   Aortic Valve: The aortic valve was not well visualized. Aortic valve  regurgitation is trivial. No aortic stenosis is present. Aortic valve mean  gradient measures 4.0 mmHg. Aortic valve peak gradient measures 7.2 mmHg.  Aortic valve area, by VTI measures  1.60 cm.   Pulmonic Valve: The pulmonic valve was not well visualized. Pulmonic valve  regurgitation is not visualized.   Aorta: The aortic root is  normal in size and structure.   Venous: The inferior vena cava is dilated in size with less than 50%  respiratory variability, suggesting right atrial pressure of 15 mmHg.   IAS/Shunts: The interatrial septum was not well visualized.    Assessment & Plan   1.  Chronic systolic CHF, nonischemic cardiomyopathy-no increased work of breathing today.  Does not appear to be fluid volume overloaded.  Echocardiogram 07/02/2022 showed a reduced EF of 20-25% with global hypokinesis and normal RV function with severely elevated PA pressure.  She was admitted to the hospital and diuresed around 11 L.  At discharge palliative consult was recommended. Continue spironolactone, losartan, metoprolol, furosemide Heart healthy low-sodium diet Daily weights-contact office with a weight increase of 2 to 3 pounds overnight or 5 pounds in 1 week Order BMP  DM type 1A-glucose 116 on 07/06/2022. Continue insulin Heart healthy low-sodium carb modified diet  Chronic pain-appears comfortable on exam today. Continue Cymbalta tramadol, gabapentin  Multiple sclerosis-wheelchair/bedbound, SNF resident. Continue current medical therapy Continue SNF care  Disposition: Follow-up with Dr. Martinique or me in 3-4 months.   Jossie Ng. Avarae Zwart NP-C     07/18/2022, 12:03 PM [address] Office [phone] Fax [phone]    I spent 14 minutes examining this patient, reviewing medications, and using patient centered shared decision making involving her cardiac care.  Prior to her visit I spent greater than 20 minutes reviewing her past medical history,  medications, and prior cardiac tests.

## 2022-07-18 ENCOUNTER — Encounter: Payer: Self-pay | Admitting: General Practice

## 2022-07-18 ENCOUNTER — Ambulatory Visit: Payer: 59 | Attending: General Practice | Admitting: General Practice

## 2022-07-18 VITALS — BP 132/86 | HR 64 | Ht 65.0 in

## 2022-07-18 DIAGNOSIS — I428 Other cardiomyopathies: Secondary | ICD-10-CM

## 2022-07-18 DIAGNOSIS — I5042 Chronic combined systolic (congestive) and diastolic (congestive) heart failure: Secondary | ICD-10-CM | POA: Diagnosis not present

## 2022-07-18 DIAGNOSIS — E109 Type 1 diabetes mellitus without complications: Secondary | ICD-10-CM

## 2022-07-18 DIAGNOSIS — G35 Multiple sclerosis: Secondary | ICD-10-CM

## 2022-07-18 DIAGNOSIS — I10 Essential (primary) hypertension: Secondary | ICD-10-CM

## 2022-07-18 DIAGNOSIS — G894 Chronic pain syndrome: Secondary | ICD-10-CM

## 2022-07-18 LAB — BASIC METABOLIC PANEL
BUN/Creatinine Ratio: 14 (ref 9–23)
BUN: 10 mg/dL (ref 6–24)
CO2: 29 mmol/L (ref 20–29)
Calcium: 9 mg/dL (ref 8.7–10.2)
Chloride: 104 mmol/L (ref 96–106)
Creatinine, Ser: 0.74 mg/dL (ref 0.57–1.00)
Glucose: 101 mg/dL — ABNORMAL HIGH (ref 70–99)
Potassium: 4.4 mmol/L (ref 3.5–5.2)
Sodium: 145 mmol/L — ABNORMAL HIGH (ref 134–144)
eGFR: 93 mL/min/{1.73_m2} (ref >59)

## 2022-07-18 MED ORDER — FUROSEMIDE 20 MG PO TABS: 20.0000 mg | ORAL_TABLET | Freq: Two times a day (BID) | ORAL | 6 refills | Status: AC

## 2022-07-18 NOTE — Patient Instructions (Signed)
Medication Instructions:  TAKE LASIX '20MG'$  BID, MAY TAKE '30MG'$  PRN SWELLING OR WEIGHT GAIN *If you need a refill on your cardiac medications before your next appointment, please call your pharmacy*  Lab Work: BMET TODAY If you have labs (blood work) drawn today and your tests are completely normal, you will receive your results only by: [place] (if you have MyChart) OR  A paper copy in the mail If you have any lab test that is abnormal or we need to change your treatment, we will call you to review the results.  Testing/Procedures: NONE  Follow-Up: At [facility], you and your health needs are our priority.  As part of our continuing mission to provide you with exceptional heart care, we have created designated Provider Care Teams.  These Care Teams include your primary Cardiologist (physician) and Advanced Practice Providers (APPs -  Physician Assistants and Nurse Practitioners) who all work together to provide you with the care you need, when you need it.  We recommend signing up for the patient portal called "MyChart".  Sign up information is provided on this After Visit Summary.  MyChart is used to connect with patients for Virtual Visits (Telemedicine).  Patients are able to view lab/test results, encounter notes, upcoming appointments, etc.  Non-urgent messages can be sent to your provider as well.   To learn more about what you can do with MyChart, go to [email or web address].    Your next appointment:   3-4 month(s)  Provider:   Peter Martinique, MD    Other Instructions PLEASE WEIGH DAILY

## 2022-07-31 ENCOUNTER — Ambulatory Visit: Admitting: Pulmonary Disease

## 2022-07-31 ENCOUNTER — Encounter: Payer: Self-pay | Admitting: Pulmonary Disease

## 2022-07-31 VITALS — BP 116/74 | HR 68

## 2022-07-31 DIAGNOSIS — R0602 Shortness of breath: Secondary | ICD-10-CM | POA: Diagnosis not present

## 2022-07-31 DIAGNOSIS — J984 Other disorders of lung: Secondary | ICD-10-CM

## 2022-07-31 DIAGNOSIS — I5043 Acute on chronic combined systolic (congestive) and diastolic (congestive) heart failure: Secondary | ICD-10-CM | POA: Diagnosis not present

## 2022-07-31 MED ORDER — BUDESONIDE 0.25 MG/2ML IN SUSP: 0.2500 mg | Freq: Two times a day (BID) | RESPIRATORY_TRACT | 12 refills | Status: AC

## 2022-07-31 NOTE — Progress Notes (Signed)
Synopsis: Referred in January 2024 for shortness of breath   Subjective:   PATIENT ID: Shannon Garrett GENDER: female DOB: [birth year], MRN: [id number]  HPI  Chief Complaint  Patient presents with   Consult    Referred from facility for SOB. Currently on 2L of O2.    Shannon Garrett is a 62 year old woman, former smoker with HFrEF, multiple sclerosis, hypertension, DMII and adrenal insufficiency who is referred to pulmonary clinic for shortness of breath.   She was admitted 1/1 to 1/5 for heart failure exacerbation and acute hypoxemic respiratory failure. She was weaned off oxygen at the time of discharge. She is currently using supplemental oxygen at her facility at 2L day/night.  She complains of thick phlegm and is having trouble coughing it up. She is receiving nebulizer treatments at the facility about 1 time per time. She has an incentive spirometer and flutter valve at home but is not using them.   She is wheelchair bound due to her MS. She is eating regular consistency diet with the assistance of aids. Denies any trouble with swallowing.   Past Medical History:  Diagnosis Date   Adrenal insufficiency ([place])    Anemia of chronic disease    Aortic atherosclerosis ([place])    COVID-19 05/10/2019   Per facility paperwork   Decubitus ulcer    stage II sacral decubitus ulcer.    Depression    DM2 (diabetes mellitus, type 2) ([place])    Glaucoma    Glaucoma    HFrEF (heart failure with reduced ejection fraction) ([place])    TTE 04/2012: EF 20-25 // TTE 08/2012: EF 50-55 // Cath 10/19/19: Normal Cors // TTE 05/20/20: EF 35-40, trivial MR, mild AI   Hypertension    MS (multiple sclerosis) ([place])    a. Dx'd late 20's. b. Tx with Novantrone, Tysabri, Copaxone previously.   NICM (nonischemic cardiomyopathy) ([place])    cath 10/2019: normal cors // probably due to multiple chemo-Tx agents used for MS   SVT (supraventricular tachycardia)      Family History  Problem Relation Age of Onset    Hypertension Mother    Cancer Mother        breast    Cancer Father        prostate   Multiple sclerosis Sister    Heart attack Neg Hx      Social History   Socioeconomic History   Marital status: Single    Spouse name: Not on file   Number of children: Not on file   Years of education: Not on file   Highest education level: Not on file  Occupational History   Not on file  Tobacco Use   Smoking status: Former    Packs/day: 0.50    Years: 5.00    Total pack years: 2.50    Types: Cigarettes   Smokeless tobacco: Never  Vaping Use   Vaping Use: Never used  Substance and Sexual Activity   Alcohol use: No   Drug use: No   Sexual activity: Not Currently  Other Topics Concern   Not on file  Social History Narrative   Not on file   Social Determinants of Health   Financial Resource Strain: Low Risk  (02/13/2018)   Overall Financial Resource Strain (CARDIA)    Difficulty of Paying Living Expenses: Not hard at all  Food Insecurity: No Food Insecurity (07/02/2022)   Hunger Vital Sign    Worried About Running Out of Food in the Last Year:  Never true    Ran Out of Food in the Last Year: Never true  Transportation Needs: No Transportation Needs (07/02/2022)   PRAPARE - Hydrologist (Medical): No    Lack of Transportation (Non-Medical): No  Physical Activity: Inactive (02/13/2018)   Exercise Vital Sign    Days of Exercise per Week: 0 days    Minutes of Exercise per Session: 0 min  Stress: No Stress Concern Present (02/13/2018)   [place]    Feeling of Stress : Only a little  Social Connections: Moderately Isolated (02/13/2018)   Social Connection and Isolation Panel [NHANES]    Frequency of Communication with Friends and Family: More than three times a week    Frequency of Social Gatherings with Friends and Family: Twice a week    Attends Religious Services: Never    Corporate treasurer or Organizations: No    Attends Archivist Meetings: Never    Marital Status: Never married  Intimate Partner Violence: Not At Risk (07/02/2022)   Humiliation, Afraid, Rape, and Kick questionnaire    Fear of Current or Ex-Partner: No    Emotionally Abused: No    Physically Abused: No    Sexually Abused: No     Allergies  Allergen Reactions   Sulfonamide Derivatives Hives and Shortness Of Breath   Penicillins Hives and Itching    Prior course of rocephin charted 05/2015 Also note has tolerated Zosyn in past 2016 and 2017     Outpatient Medications Prior to Visit  Medication Sig Dispense Refill   acetaminophen (TYLENOL) 325 MG tablet Take 650 mg by mouth every 8 (eight) hours as needed (pain, fever > 100.5).     albuterol (PROVENTIL) (2.5 MG/3ML) 0.083% nebulizer solution Take 2.5 mg by nebulization every 6 (six) hours as needed for wheezing or shortness of breath.     albuterol (VENTOLIN HFA) 108 (90 Base) MCG/ACT inhaler Inhale 2 puffs into the lungs every 6 (six) hours as needed for wheezing or shortness of breath.      amantadine (SYMMETREL) 100 MG capsule Take 100 mg by mouth in the morning.     ascorbic acid (VITAMIN C) 500 MG tablet Take 500 mg by mouth in the morning.     aspirin EC 81 MG tablet Take 1 tablet (81 mg total) by mouth daily. 30 tablet 0   AZO-CRANBERRY PO Take 2 each by mouth in the morning.     baclofen (LIORESAL) 10 MG tablet Take 10 mg by mouth 3 (three) times daily. Hold if lethargic or confused     bisacodyl (DULCOLAX) 10 MG suppository Place 10 mg rectally daily as needed for moderate constipation. If no results for MOM     buPROPion (WELLBUTRIN XL) 150 MG 24 hr tablet Take 150 mg by mouth in the morning.     DULoxetine (CYMBALTA) 20 MG capsule Take 60 mg by mouth in the morning.     Ensure (ENSURE) Take 237 mLs by mouth 3 (three) times daily.     ferrous sulfate 325 (65 FE) MG tablet Take 325 mg by mouth 2 (two) times daily with a meal.       fluticasone (FLONASE) 50 MCG/ACT nasal spray Place 1 spray into both nostrils at bedtime.     furosemide (LASIX) 20 MG tablet Take 1 tablet (20 mg total) by mouth 2 (two) times daily. MAY TAKE ADDITIONAL '30MG'$  PRN WEIGHT GAIN OR SWELLING  90 tablet 6   gabapentin (NEURONTIN) 600 MG tablet Take 1 tablet (600 mg total) by mouth 3 (three) times daily.     insulin aspart (NOVOLOG FLEXPEN) 100 UNIT/ML FlexPen 0-15 Units, Subcutaneous, 3 times daily with meals CBG < 70: implement hypoglycemia protocol-call MD CBG 70 - 120: 0 units CBG 121 - 150: 2 units CBG 151 - 200: 3 units CBG 201 - 250: 5 units CBG 251 - 300: 8 units CBG 301 - 350: 11 units CBG 351 - 400: 15 units CBG > 400: (Patient taking differently: Inject 0-15 Units into the skin 3 (three) times daily with meals. 0-15 Units, Subcutaneous, 3 times daily with meals per sliding scale : CBG < 70: implement hypoglycemia protocol-call MD CBG 0 - 120 : 0 units CBG 121 - 150: 2 units CBG 151 - 200: 3 units CBG 201 - 250: 5 units CBG 251 - 300: 8 units CBG 301 - 350: 11 units CBG 351 - 400: 15 units CBG >400, CALL MD) 15 mL 0   ipratropium-albuterol (DUONEB) 0.5-2.5 (3) MG/3ML SOLN Inhale 3 mLs into the lungs every 8 (eight) hours as needed (wheezing, shortness of breath).     latanoprost (XALATAN) 0.005 % ophthalmic solution Place 1 drop into both eyes at bedtime.     linaclotide (LINZESS) 290 MCG CAPS capsule Take 290 mcg by mouth daily before breakfast.     losartan (COZAAR) 25 MG tablet Take 1 tablet (25 mg total) by mouth daily.     magnesium oxide (MAG-OX) 400 (240 Mg) MG tablet Take 1 tablet (400 mg total) by mouth daily.     Melatonin 5 MG TABS Take 10 mg by mouth at bedtime.      metoprolol succinate (TOPROL-XL) 50 MG 24 hr tablet Take 1.5 tablets (75 mg total) by mouth in the morning. (Patient taking differently: Take 100 mg by mouth in the morning.)     Multiple Vitamins-Minerals (MULTIVITAMIN ADULT PO) Take 1 tablet by mouth in the  morning.     NONFORMULARY OR COMPOUNDED ITEM Apply 1 application  topically 3 (three) times daily. A&D/Balmex/Lidocaine 1:1:1. Apply to vulva 3 times daily.     nystatin (MYCOSTATIN/NYSTOP) powder Apply 1 Application topically 2 (two) times daily. To vagina, armpits     ondansetron (ZOFRAN) 4 MG tablet Take 4 mg by mouth every 6 (six) hours as needed for nausea or vomiting.     OXYGEN Inhale 2-4 L into the lungs as needed (O2 sats <93%).     polyethylene glycol (MIRALAX MIX-IN PAX) 17 g packet Take 17 g by mouth daily. (Patient taking differently: Take 17 g by mouth in the morning.) 14 each 0   Potassium Chloride ER 20 MEQ TBCR Take 20 mEq by mouth in the morning.     Probiotic Product (PROBIOTIC PO) Take 1 capsule by mouth 2 (two) times daily.     senna-docusate (SENOKOT-S) 8.6-50 MG tablet Take 2 tablets by mouth 2 (two) times daily.     simethicone (MYLICON) 607 MG chewable tablet Chew 125 mg by mouth every 6 (six) hours as needed for flatulence.     simvastatin (ZOCOR) 10 MG tablet Take 10 mg by mouth at bedtime.     spironolactone (ALDACTONE) 25 MG tablet Take 25 mg by mouth in the morning.     traMADol (ULTRAM) 50 MG tablet Take 2 tablets (100 mg total) by mouth 2 (two) times daily as needed (pain). (Patient taking differently: Take 100 mg by mouth at bedtime.)  10 tablet 0   No facility-administered medications prior to visit.    Review of Systems  Constitutional:  Negative for chills, fever, malaise/fatigue and weight loss.  HENT:  Negative for congestion, sinus pain and sore throat.   Eyes: Negative.   Respiratory:  Positive for cough, sputum production, shortness of breath and wheezing. Negative for hemoptysis.   Cardiovascular:  Positive for leg swelling. Negative for chest pain, palpitations, orthopnea and claudication.  Gastrointestinal:  Negative for abdominal pain, heartburn, nausea and vomiting.  Genitourinary: Negative.   Musculoskeletal:  Negative for joint pain and  myalgias.  Skin:  Negative for rash.  Neurological:  Negative for weakness.  Endo/Heme/Allergies: Negative.   Psychiatric/Behavioral: Negative.     Objective:   Vitals:   07/31/22 1017  BP: 116/74  Pulse: 68  SpO2: 100%   Physical Exam Constitutional:      General: She is not in acute distress.    Appearance: She is not ill-appearing.  HENT:     Head: Normocephalic and atraumatic.  Eyes:     General: No scleral icterus.    Conjunctiva/sclera: Conjunctivae normal.     Pupils: Pupils are equal, round, and reactive to light.  Cardiovascular:     Rate and Rhythm: Normal rate and regular rhythm.     Pulses: Normal pulses.     Heart sounds: Normal heart sounds. No murmur heard. Pulmonary:     Effort: Pulmonary effort is normal.     Breath sounds: Normal breath sounds. No wheezing, rhonchi or rales.  Abdominal:     General: Bowel sounds are normal.     Palpations: Abdomen is soft.  Musculoskeletal:     Right lower leg: No edema.     Left lower leg: No edema.  Lymphadenopathy:     Cervical: No cervical adenopathy.  Skin:    General: Skin is warm and dry.  Neurological:     General: No focal deficit present.     Mental Status: She is alert.  Psychiatric:        Mood and Affect: Mood normal.        Behavior: Behavior normal.        Thought Content: Thought content normal.        Judgment: Judgment normal.    CBC    Component Value Date/Time   WBC 5.8 07/04/2022 0037   RBC 3.91 07/04/2022 0037   HGB 10.8 (L) 07/04/2022 0037   HGB 12.0 10/26/2019 1100   HCT 34.0 (L) 07/04/2022 0037   HCT 36.3 10/26/2019 1100   PLT 249 07/04/2022 0037   PLT 354 10/26/2019 1100   MCV 87.0 07/04/2022 0037   MCV 83 10/26/2019 1100   MCH 27.6 07/04/2022 0037   MCHC 31.8 07/04/2022 0037   RDW 16.3 (H) 07/04/2022 0037   RDW 16.8 (H) 10/26/2019 1100   LYMPHSABS 1.9 07/02/2022 0500   LYMPHSABS 0.9 08/26/2018 0959   MONOABS 0.7 07/02/2022 0500   EOSABS 0.6 (H) 07/02/2022 0500    EOSABS 0.9 (H) 08/26/2018 0959   BASOSABS 0.1 07/02/2022 0500   BASOSABS 0.1 08/26/2018 0959   Chest imaging: CT Chest 05/10/22 1. No pulmonary nodules. 2. Similar small left and slightly increased trace right pleural effusions. Mild bibasilar interlobular septal thickening, likely reflecting mild pulmonary edema. 3. Reflux of contrast material into the hepatic veins suggests a degree of right heart dysfunction. Partially imaged mild bilateral body wall edema, suggestive of volume overload/third-spacing. 4. Mildly dilated main pulmonary artery, which can be seen  in the setting of pulmonary arterial hypertension. 5. Diffuse mosaic attenuation, likely due to air trapping, which can be seen in the setting of small airways disease. 6. Slightly increased size of a 12 mm right pretracheal lymph node, previously 9 mm. This is nonspecific but favored to be reactive. 7. Aortic Atherosclerosis  PFT:     No data to display          Labs:  Path:  Echo 07/02/22: LV EF 20-25%. RV size appears normal along with function. LA moderate dilated. Moderate to severe mitral valve regurgitation.   Heart Catheterization:  Assessment & Plan:   Shortness of breath  Acute on chronic combined systolic and diastolic CHF (congestive heart failure) (HCC) - Plan: Pulse oximetry, overnight  Small airways disease  Discussion: Shannon Garrett is a 62 year old woman, former smoker with HFrEF, multiple sclerosis, hypertension, DMII and adrenal insufficiency who is referred to pulmonary clinic for shortness of breath.   Her dyspnea is mostly related to heart failure and deconditioning. She has findings of small airways disease on CT Chest scan with symptoms of cough and wheezing.   She is to use duonebs q6hrs during the day and start budesonide 0.'25mg'$ /mL twice daily. She is to use incentive spirometer 2-3 times per day and use her flutter valve twice daily after nebs.   She is 98% on room air today. We  will check an overnight oximetry test to determine if she needs nocturnal oxygen. Given her arm weakness, I do not recommend evaluating her for nocturnal PAP therapy as she does not appear strong enough to pull mask off if needed.   Follow up in 4-8 weeks to monitor how she is doing on nebs.  Freda Jackson, MD [facility] Office: [phone]   Current Outpatient Medications:    acetaminophen (TYLENOL) 325 MG tablet, Take 650 mg by mouth every 8 (eight) hours as needed (pain, fever > 100.5)., Disp: , Rfl:    albuterol (PROVENTIL) (2.5 MG/3ML) 0.083% nebulizer solution, Take 2.5 mg by nebulization every 6 (six) hours as needed for wheezing or shortness of breath., Disp: , Rfl:    albuterol (VENTOLIN HFA) 108 (90 Base) MCG/ACT inhaler, Inhale 2 puffs into the lungs every 6 (six) hours as needed for wheezing or shortness of breath. , Disp: , Rfl:    amantadine (SYMMETREL) 100 MG capsule, Take 100 mg by mouth in the morning., Disp: , Rfl:    ascorbic acid (VITAMIN C) 500 MG tablet, Take 500 mg by mouth in the morning., Disp: , Rfl:    aspirin EC 81 MG tablet, Take 1 tablet (81 mg total) by mouth daily., Disp: 30 tablet, Rfl: 0   AZO-CRANBERRY PO, Take 2 each by mouth in the morning., Disp: , Rfl:    baclofen (LIORESAL) 10 MG tablet, Take 10 mg by mouth 3 (three) times daily. Hold if lethargic or confused, Disp: , Rfl:    bisacodyl (DULCOLAX) 10 MG suppository, Place 10 mg rectally daily as needed for moderate constipation. If no results for MOM, Disp: , Rfl:    budesonide (PULMICORT) 0.25 MG/2ML nebulizer solution, Take 2 mLs (0.25 mg total) by nebulization 2 (two) times daily., Disp: 60 mL, Rfl: 12   buPROPion (WELLBUTRIN XL) 150 MG 24 hr tablet, Take 150 mg by mouth in the morning., Disp: , Rfl:    DULoxetine (CYMBALTA) 20 MG capsule, Take 60 mg by mouth in the morning., Disp: , Rfl:    Ensure (ENSURE), Take 606  mLs by mouth 3 (three) times daily., Disp: , Rfl:    ferrous  sulfate 325 (65 FE) MG tablet, Take 325 mg by mouth 2 (two) times daily with a meal. , Disp: , Rfl:    fluticasone (FLONASE) 50 MCG/ACT nasal spray, Place 1 spray into both nostrils at bedtime., Disp: , Rfl:    furosemide (LASIX) 20 MG tablet, Take 1 tablet (20 mg total) by mouth 2 (two) times daily. MAY TAKE ADDITIONAL '30MG'$  PRN WEIGHT GAIN OR SWELLING, Disp: 90 tablet, Rfl: 6   gabapentin (NEURONTIN) 600 MG tablet, Take 1 tablet (600 mg total) by mouth 3 (three) times daily., Disp: , Rfl:    insulin aspart (NOVOLOG FLEXPEN) 100 UNIT/ML FlexPen, 0-15 Units, Subcutaneous, 3 times daily with meals CBG < 70: implement hypoglycemia protocol-call MD CBG 70 - 120: 0 units CBG 121 - 150: 2 units CBG 151 - 200: 3 units CBG 201 - 250: 5 units CBG 251 - 300: 8 units CBG 301 - 350: 11 units CBG 351 - 400: 15 units CBG > 400: (Patient taking differently: Inject 0-15 Units into the skin 3 (three) times daily with meals. 0-15 Units, Subcutaneous, 3 times daily with meals per sliding scale : CBG < 70: implement hypoglycemia protocol-call MD CBG 0 - 120 : 0 units CBG 121 - 150: 2 units CBG 151 - 200: 3 units CBG 201 - 250: 5 units CBG 251 - 300: 8 units CBG 301 - 350: 11 units CBG 351 - 400: 15 units CBG >400, CALL MD), Disp: 15 mL, Rfl: 0   ipratropium-albuterol (DUONEB) 0.5-2.5 (3) MG/3ML SOLN, Inhale 3 mLs into the lungs every 8 (eight) hours as needed (wheezing, shortness of breath)., Disp: , Rfl:    latanoprost (XALATAN) 0.005 % ophthalmic solution, Place 1 drop into both eyes at bedtime., Disp: , Rfl:    linaclotide (LINZESS) 290 MCG CAPS capsule, Take 290 mcg by mouth daily before breakfast., Disp: , Rfl:    losartan (COZAAR) 25 MG tablet, Take 1 tablet (25 mg total) by mouth daily., Disp: , Rfl:    magnesium oxide (MAG-OX) 400 (240 Mg) MG tablet, Take 1 tablet (400 mg total) by mouth daily., Disp: , Rfl:    Melatonin 5 MG TABS, Take 10 mg by mouth at bedtime. , Disp: , Rfl:    metoprolol succinate (TOPROL-XL) 50  MG 24 hr tablet, Take 1.5 tablets (75 mg total) by mouth in the morning. (Patient taking differently: Take 100 mg by mouth in the morning.), Disp: , Rfl:    Multiple Vitamins-Minerals (MULTIVITAMIN ADULT PO), Take 1 tablet by mouth in the morning., Disp: , Rfl:    NONFORMULARY OR COMPOUNDED ITEM, Apply 1 application  topically 3 (three) times daily. A&D/Balmex/Lidocaine 1:1:1. Apply to vulva 3 times daily., Disp: , Rfl:    nystatin (MYCOSTATIN/NYSTOP) powder, Apply 1 Application topically 2 (two) times daily. To vagina, armpits, Disp: , Rfl:    ondansetron (ZOFRAN) 4 MG tablet, Take 4 mg by mouth every 6 (six) hours as needed for nausea or vomiting., Disp: , Rfl:    OXYGEN, Inhale 2-4 L into the lungs as needed (O2 sats <93%)., Disp: , Rfl:    polyethylene glycol (MIRALAX MIX-IN PAX) 17 g packet, Take 17 g by mouth daily. (Patient taking differently: Take 17 g by mouth in the morning.), Disp: 14 each, Rfl: 0   Potassium Chloride ER 20 MEQ TBCR, Take 20 mEq by mouth in the morning., Disp: , Rfl:    Probiotic  Product (PROBIOTIC PO), Take 1 capsule by mouth 2 (two) times daily., Disp: , Rfl:    senna-docusate (SENOKOT-S) 8.6-50 MG tablet, Take 2 tablets by mouth 2 (two) times daily., Disp: , Rfl:    simethicone (MYLICON) 696 MG chewable tablet, Chew 125 mg by mouth every 6 (six) hours as needed for flatulence., Disp: , Rfl:    simvastatin (ZOCOR) 10 MG tablet, Take 10 mg by mouth at bedtime., Disp: , Rfl:    spironolactone (ALDACTONE) 25 MG tablet, Take 25 mg by mouth in the morning., Disp: , Rfl:    traMADol (ULTRAM) 50 MG tablet, Take 2 tablets (100 mg total) by mouth 2 (two) times daily as needed (pain). (Patient taking differently: Take 100 mg by mouth at bedtime.), Disp: 10 tablet, Rfl: 0

## 2022-07-31 NOTE — Patient Instructions (Addendum)
Nebulizer Regimen: Use duoneb nebulizer treatment 2-3 times per day, every 6 hours  Start budesonide 0.'25mg'$  nebulizer treatment twice daily  Recommend using your incentive spirometer 2-3 times per day  Recommend using your flutter valve 2 times per day after morning and evening nebulizer treatments to help cough out mucous.   Follow up in 1-2 months

## 2022-09-06 ENCOUNTER — Ambulatory Visit: Payer: 59 | Admitting: Pulmonary Disease

## 2022-09-14 ENCOUNTER — Emergency Department (HOSPITAL_COMMUNITY)

## 2022-09-14 ENCOUNTER — Encounter (HOSPITAL_COMMUNITY): Payer: Self-pay

## 2022-09-14 ENCOUNTER — Other Ambulatory Visit: Payer: Self-pay

## 2022-09-14 ENCOUNTER — Inpatient Hospital Stay (HOSPITAL_COMMUNITY)
Admission: EM | Admit: 2022-09-14 | Discharge: 2022-09-18 | DRG: 699 | Disposition: A | Discharge status: Skilled Nursing Facility | Source: Skilled Nursing Facility | Attending: Family Medicine | Admitting: Family Medicine

## 2022-09-14 DIAGNOSIS — F419 Anxiety disorder, unspecified: Secondary | ICD-10-CM | POA: Diagnosis present

## 2022-09-14 DIAGNOSIS — G35 Multiple sclerosis: Secondary | ICD-10-CM | POA: Diagnosis present

## 2022-09-14 DIAGNOSIS — D638 Anemia in other chronic diseases classified elsewhere: Secondary | ICD-10-CM | POA: Diagnosis present

## 2022-09-14 DIAGNOSIS — Z8249 Family history of ischemic heart disease and other diseases of the circulatory system: Secondary | ICD-10-CM

## 2022-09-14 DIAGNOSIS — N12 Tubulo-interstitial nephritis, not specified as acute or chronic: Secondary | ICD-10-CM | POA: Diagnosis present

## 2022-09-14 DIAGNOSIS — Z79899 Other long term (current) drug therapy: Secondary | ICD-10-CM

## 2022-09-14 DIAGNOSIS — E669 Obesity, unspecified: Secondary | ICD-10-CM | POA: Diagnosis present

## 2022-09-14 DIAGNOSIS — I959 Hypotension, unspecified: Secondary | ICD-10-CM | POA: Diagnosis not present

## 2022-09-14 DIAGNOSIS — T83511A Infection and inflammatory reaction due to indwelling urethral catheter, initial encounter: Secondary | ICD-10-CM | POA: Diagnosis present

## 2022-09-14 DIAGNOSIS — Z7982 Long term (current) use of aspirin: Secondary | ICD-10-CM

## 2022-09-14 DIAGNOSIS — E119 Type 2 diabetes mellitus without complications: Secondary | ICD-10-CM

## 2022-09-14 DIAGNOSIS — G35D Multiple sclerosis, unspecified: Secondary | ICD-10-CM | POA: Diagnosis present

## 2022-09-14 DIAGNOSIS — Z8616 Personal history of COVID-19: Secondary | ICD-10-CM

## 2022-09-14 DIAGNOSIS — Z515 Encounter for palliative care: Secondary | ICD-10-CM | POA: Diagnosis not present

## 2022-09-14 DIAGNOSIS — F32A Depression, unspecified: Secondary | ICD-10-CM | POA: Diagnosis present

## 2022-09-14 DIAGNOSIS — Z794 Long term (current) use of insulin: Secondary | ICD-10-CM

## 2022-09-14 DIAGNOSIS — Z6832 Body mass index (BMI) 32.0-32.9, adult: Secondary | ICD-10-CM

## 2022-09-14 DIAGNOSIS — Z1624 Resistance to multiple antibiotics: Secondary | ICD-10-CM | POA: Diagnosis present

## 2022-09-14 DIAGNOSIS — N319 Neuromuscular dysfunction of bladder, unspecified: Secondary | ICD-10-CM | POA: Diagnosis present

## 2022-09-14 DIAGNOSIS — N390 Urinary tract infection, site not specified: Secondary | ICD-10-CM | POA: Diagnosis present

## 2022-09-14 DIAGNOSIS — G8220 Paraplegia, unspecified: Secondary | ICD-10-CM | POA: Diagnosis present

## 2022-09-14 DIAGNOSIS — Z66 Do not resuscitate: Secondary | ICD-10-CM | POA: Diagnosis present

## 2022-09-14 DIAGNOSIS — E11649 Type 2 diabetes mellitus with hypoglycemia without coma: Secondary | ICD-10-CM | POA: Diagnosis present

## 2022-09-14 DIAGNOSIS — T83038A Leakage of other indwelling urethral catheter, initial encounter: Secondary | ICD-10-CM | POA: Diagnosis present

## 2022-09-14 DIAGNOSIS — G894 Chronic pain syndrome: Secondary | ICD-10-CM | POA: Diagnosis present

## 2022-09-14 DIAGNOSIS — I110 Hypertensive heart disease with heart failure: Secondary | ICD-10-CM | POA: Diagnosis present

## 2022-09-14 DIAGNOSIS — E2740 Unspecified adrenocortical insufficiency: Secondary | ICD-10-CM | POA: Diagnosis present

## 2022-09-14 DIAGNOSIS — I5022 Chronic systolic (congestive) heart failure: Secondary | ICD-10-CM | POA: Diagnosis present

## 2022-09-14 DIAGNOSIS — I428 Other cardiomyopathies: Secondary | ICD-10-CM | POA: Diagnosis present

## 2022-09-14 DIAGNOSIS — Z7401 Bed confinement status: Secondary | ICD-10-CM

## 2022-09-14 DIAGNOSIS — I272 Pulmonary hypertension, unspecified: Secondary | ICD-10-CM | POA: Diagnosis present

## 2022-09-14 DIAGNOSIS — K5900 Constipation, unspecified: Secondary | ICD-10-CM | POA: Diagnosis present

## 2022-09-14 DIAGNOSIS — Z820 Family history of epilepsy and other diseases of the nervous system: Secondary | ICD-10-CM

## 2022-09-14 DIAGNOSIS — R197 Diarrhea, unspecified: Secondary | ICD-10-CM | POA: Diagnosis not present

## 2022-09-14 DIAGNOSIS — I69391 Dysphagia following cerebral infarction: Secondary | ICD-10-CM | POA: Diagnosis not present

## 2022-09-14 DIAGNOSIS — R1084 Generalized abdominal pain: Principal | ICD-10-CM

## 2022-09-14 DIAGNOSIS — Z87891 Personal history of nicotine dependence: Secondary | ICD-10-CM | POA: Diagnosis not present

## 2022-09-14 DIAGNOSIS — Y846 Urinary catheterization as the cause of abnormal reaction of the patient, or of later complication, without mention of misadventure at the time of the procedure: Secondary | ICD-10-CM | POA: Diagnosis present

## 2022-09-14 DIAGNOSIS — T83010A Breakdown (mechanical) of cystostomy catheter, initial encounter: Secondary | ICD-10-CM

## 2022-09-14 DIAGNOSIS — R109 Unspecified abdominal pain: Secondary | ICD-10-CM

## 2022-09-14 LAB — URINALYSIS, ROUTINE W REFLEX MICROSCOPIC
Bilirubin Urine: NEGATIVE
Glucose, UA: NEGATIVE mg/dL
Ketones, ur: NEGATIVE mg/dL
Nitrite: NEGATIVE
Protein, ur: 30 mg/dL — AB
RBC / HPF: >50 RBC/hpf (ref 0–5)
Specific Gravity, Urine: 1.01 (ref 1.005–1.030)
WBC, UA: >50 WBC/hpf (ref 0–5)
pH: 7 (ref 5.0–8.0)

## 2022-09-14 LAB — CBC WITH DIFFERENTIAL/PLATELET
Abs Immature Granulocytes: 0.01 10*3/uL (ref 0.00–0.07)
Basophils Absolute: 0.1 10*3/uL (ref 0.0–0.1)
Basophils Relative: 2 %
Eosinophils Absolute: 0.5 10*3/uL (ref 0.0–0.5)
Eosinophils Relative: 9 %
HCT: 40.1 % (ref 36.0–46.0)
Hemoglobin: 12.8 g/dL (ref 12.0–15.0)
Immature Granulocytes: 0 %
Lymphocytes Relative: 28 %
Lymphs Abs: 1.6 10*3/uL (ref 0.7–4.0)
MCH: 27.9 pg (ref 26.0–34.0)
MCHC: 31.9 g/dL (ref 30.0–36.0)
MCV: 87.6 fL (ref 80.0–100.0)
Monocytes Absolute: 0.3 10*3/uL (ref 0.1–1.0)
Monocytes Relative: 6 %
Neutro Abs: 3.3 10*3/uL (ref 1.7–7.7)
Neutrophils Relative %: 55 %
Platelets: 243 10*3/uL (ref 150–400)
RBC: 4.58 MIL/uL (ref 3.87–5.11)
RDW: 15.9 % — ABNORMAL HIGH (ref 11.5–15.5)
WBC: 5.9 10*3/uL (ref 4.0–10.5)
nRBC: 0 % (ref 0.0–0.2)

## 2022-09-14 LAB — CBC
HCT: 39.6 % (ref 36.0–46.0)
Hemoglobin: 12.8 g/dL (ref 12.0–15.0)
MCH: 28.1 pg (ref 26.0–34.0)
MCHC: 32.3 g/dL (ref 30.0–36.0)
MCV: 87 fL (ref 80.0–100.0)
Platelets: 257 10*3/uL (ref 150–400)
RBC: 4.55 MIL/uL (ref 3.87–5.11)
RDW: 15.9 % — ABNORMAL HIGH (ref 11.5–15.5)
WBC: 5.3 10*3/uL (ref 4.0–10.5)
nRBC: 0 % (ref 0.0–0.2)

## 2022-09-14 LAB — COMPREHENSIVE METABOLIC PANEL
ALT: 18 U/L (ref 0–44)
AST: 21 U/L (ref 15–41)
Albumin: 4.1 g/dL (ref 3.5–5.0)
Alkaline Phosphatase: 144 U/L — ABNORMAL HIGH (ref 38–126)
Anion gap: 11 (ref 5–15)
BUN: 19 mg/dL (ref 6–20)
CO2: 30 mmol/L (ref 22–32)
Calcium: 9.2 mg/dL (ref 8.9–10.3)
Chloride: 100 mmol/L (ref 98–111)
Creatinine, Ser: 0.84 mg/dL (ref 0.44–1.00)
GFR, Estimated: >60 mL/min (ref >60)
Glucose, Bld: 82 mg/dL (ref 70–99)
Potassium: 4 mmol/L (ref 3.5–5.1)
Sodium: 141 mmol/L (ref 135–145)
Total Bilirubin: 0.5 mg/dL (ref 0.3–1.2)
Total Protein: 7.8 g/dL (ref 6.5–8.1)

## 2022-09-14 LAB — GLUCOSE, CAPILLARY: Glucose-Capillary: 73 mg/dL (ref 70–99)

## 2022-09-14 LAB — PROTIME-INR
INR: 1.1 (ref 0.8–1.2)
Prothrombin Time: 13.7 seconds (ref 11.4–15.2)

## 2022-09-14 LAB — LIPASE, BLOOD: Lipase: 26 U/L (ref 11–51)

## 2022-09-14 LAB — CREATININE, SERUM
Creatinine, Ser: 0.8 mg/dL (ref 0.44–1.00)
GFR, Estimated: >60 mL/min (ref >60)

## 2022-09-14 LAB — LACTIC ACID, PLASMA: Lactic Acid, Venous: 1.6 mmol/L (ref 0.5–1.9)

## 2022-09-14 MED ORDER — TRAZODONE HCL 50 MG PO TABS
25.0000 mg | ORAL_TABLET | Freq: Every evening | ORAL | Status: DC | PRN
  Filled 2022-09-14: qty 1

## 2022-09-14 MED ORDER — FLEET ENEMA 7-19 GM/118ML RE ENEM: 1.0000 | ENEMA | Freq: Once | RECTAL | Status: DC

## 2022-09-14 MED ORDER — SODIUM CHLORIDE 0.9 % IV SOLN
1.0000 g | Freq: Three times a day (TID) | INTRAVENOUS | Status: DC
  Administered 2022-09-14 – 2022-09-17 (×9): 1 g via INTRAVENOUS
  Filled 2022-09-14 (×11): qty 20

## 2022-09-14 MED ORDER — SIMVASTATIN 10 MG PO TABS
10.0000 mg | ORAL_TABLET | Freq: Every day | ORAL | Status: DC
  Administered 2022-09-14 – 2022-09-17 (×4): 10 mg via ORAL
  Filled 2022-09-14 (×4): qty 1

## 2022-09-14 MED ORDER — SODIUM CHLORIDE 0.9 % IV SOLN
1.0000 g | Freq: Once | INTRAVENOUS | Status: AC
  Administered 2022-09-14: 1 g via INTRAVENOUS
  Filled 2022-09-14: qty 20

## 2022-09-14 MED ORDER — DULOXETINE HCL 30 MG PO CPEP
60.0000 mg | ORAL_CAPSULE | Freq: Every day | ORAL | Status: DC
  Administered 2022-09-15 – 2022-09-18 (×4): 60 mg via ORAL
  Filled 2022-09-14 (×4): qty 2

## 2022-09-14 MED ORDER — IPRATROPIUM-ALBUTEROL 0.5-2.5 (3) MG/3ML IN SOLN: 3.0000 mL | Freq: Three times a day (TID) | RESPIRATORY_TRACT | Status: DC | PRN

## 2022-09-14 MED ORDER — BUDESONIDE 0.25 MG/2ML IN SUSP: 0.2500 mg | Freq: Two times a day (BID) | RESPIRATORY_TRACT | Status: DC

## 2022-09-14 MED ORDER — MAGNESIUM OXIDE -MG SUPPLEMENT 400 (240 MG) MG PO TABS
400.0000 mg | ORAL_TABLET | Freq: Every day | ORAL | Status: DC
  Administered 2022-09-15 – 2022-09-18 (×4): 400 mg via ORAL
  Filled 2022-09-14 (×4): qty 1

## 2022-09-14 MED ORDER — FUROSEMIDE 40 MG PO TABS
20.0000 mg | ORAL_TABLET | Freq: Two times a day (BID) | ORAL | Status: DC
  Administered 2022-09-14 – 2022-09-16 (×4): 20 mg via ORAL
  Filled 2022-09-14 (×4): qty 1

## 2022-09-14 MED ORDER — SENNOSIDES-DOCUSATE SODIUM 8.6-50 MG PO TABS
2.0000 | ORAL_TABLET | Freq: Two times a day (BID) | ORAL | Status: DC
  Administered 2022-09-14 – 2022-09-17 (×5): 2 via ORAL
  Filled 2022-09-14 (×5): qty 2

## 2022-09-14 MED ORDER — BUPROPION HCL ER (XL) 150 MG PO TB24
150.0000 mg | ORAL_TABLET | Freq: Every day | ORAL | Status: DC
  Administered 2022-09-15 – 2022-09-18 (×4): 150 mg via ORAL
  Filled 2022-09-14 (×4): qty 1

## 2022-09-14 MED ORDER — ENOXAPARIN SODIUM 40 MG/0.4ML IJ SOSY
40.0000 mg | PREFILLED_SYRINGE | INTRAMUSCULAR | Status: DC
  Administered 2022-09-14 – 2022-09-17 (×4): 40 mg via SUBCUTANEOUS
  Filled 2022-09-14 (×4): qty 0.4

## 2022-09-14 MED ORDER — LACTATED RINGERS IV BOLUS (SEPSIS): 1000.0000 mL | Freq: Once | INTRAVENOUS | Status: DC

## 2022-09-14 MED ORDER — AMANTADINE HCL 100 MG PO CAPS
100.0000 mg | ORAL_CAPSULE | Freq: Every day | ORAL | Status: DC
  Administered 2022-09-15 – 2022-09-18 (×4): 100 mg via ORAL
  Filled 2022-09-14 (×4): qty 1

## 2022-09-14 MED ORDER — LACTATED RINGERS IV SOLN
150.0000 mL/h | INTRAVENOUS | Status: DC
  Administered 2022-09-14 – 2022-09-15 (×2): 150 mL/h via INTRAVENOUS

## 2022-09-14 MED ORDER — LINACLOTIDE 145 MCG PO CAPS
290.0000 ug | ORAL_CAPSULE | Freq: Every day | ORAL | Status: DC
  Administered 2022-09-15 – 2022-09-18 (×4): 290 ug via ORAL
  Filled 2022-09-14 (×4): qty 2

## 2022-09-14 MED ORDER — LATANOPROST 0.005 % OP SOLN
1.0000 [drp] | Freq: Every day | OPHTHALMIC | Status: DC
  Administered 2022-09-14 – 2022-09-17 (×4): 1 [drp] via OPHTHALMIC
  Filled 2022-09-14: qty 2.5

## 2022-09-14 MED ORDER — OXYCODONE HCL 5 MG PO TABS
5.0000 mg | ORAL_TABLET | ORAL | Status: DC | PRN
  Administered 2022-09-15 – 2022-09-18 (×5): 5 mg via ORAL
  Filled 2022-09-14 (×6): qty 1

## 2022-09-14 MED ORDER — TRAMADOL HCL 50 MG PO TABS
100.0000 mg | ORAL_TABLET | Freq: Every day | ORAL | Status: DC
  Administered 2022-09-14 – 2022-09-17 (×4): 100 mg via ORAL
  Filled 2022-09-14 (×4): qty 2

## 2022-09-14 MED ORDER — ASPIRIN 81 MG PO TBEC: 81.0000 mg | DELAYED_RELEASE_TABLET | Freq: Every day | ORAL | Status: DC

## 2022-09-14 MED ORDER — METRONIDAZOLE 500 MG/100ML IV SOLN: 500.0000 mg | Freq: Two times a day (BID) | INTRAVENOUS | Status: DC

## 2022-09-14 MED ORDER — DOCUSATE SODIUM 100 MG PO CAPS
100.0000 mg | ORAL_CAPSULE | Freq: Two times a day (BID) | ORAL | Status: DC
  Administered 2022-09-14 – 2022-09-17 (×5): 100 mg via ORAL
  Filled 2022-09-14 (×5): qty 1

## 2022-09-14 MED ORDER — SODIUM CHLORIDE 0.9 % IV SOLN: 2.0000 g | INTRAVENOUS | Status: DC

## 2022-09-14 MED ORDER — IOHEXOL 300 MG/ML  SOLN
100.0000 mL | Freq: Once | INTRAMUSCULAR | Status: AC | PRN
  Administered 2022-09-14: 100 mL via INTRAVENOUS

## 2022-09-14 MED ORDER — FLUTICASONE PROPIONATE 50 MCG/ACT NA SUSP
1.0000 | Freq: Every day | NASAL | Status: DC
  Administered 2022-09-14 – 2022-09-17 (×2): 1 via NASAL
  Filled 2022-09-14: qty 16

## 2022-09-14 MED ORDER — ONDANSETRON HCL 4 MG/2ML IJ SOLN: 4.0000 mg | Freq: Four times a day (QID) | INTRAMUSCULAR | Status: DC | PRN

## 2022-09-14 MED ORDER — GABAPENTIN 300 MG PO CAPS
600.0000 mg | ORAL_CAPSULE | Freq: Three times a day (TID) | ORAL | Status: DC
  Administered 2022-09-14 – 2022-09-18 (×11): 600 mg via ORAL
  Filled 2022-09-14 (×11): qty 2

## 2022-09-14 MED ORDER — LOSARTAN POTASSIUM 25 MG PO TABS
25.0000 mg | ORAL_TABLET | Freq: Every day | ORAL | Status: DC
  Administered 2022-09-15 – 2022-09-16 (×2): 25 mg via ORAL
  Filled 2022-09-14 (×2): qty 1

## 2022-09-14 MED ORDER — ACETAMINOPHEN 325 MG PO TABS
650.0000 mg | ORAL_TABLET | Freq: Three times a day (TID) | ORAL | Status: DC | PRN
  Administered 2022-09-16 (×2): 650 mg via ORAL
  Filled 2022-09-14 (×2): qty 2

## 2022-09-14 MED ORDER — ALBUTEROL SULFATE (2.5 MG/3ML) 0.083% IN NEBU: 2.5000 mg | INHALATION_SOLUTION | Freq: Four times a day (QID) | RESPIRATORY_TRACT | Status: DC | PRN

## 2022-09-14 MED ORDER — OXYCODONE-ACETAMINOPHEN 5-325 MG PO TABS
1.0000 | ORAL_TABLET | Freq: Once | ORAL | Status: AC
  Administered 2022-09-14: 1 via ORAL
  Filled 2022-09-14: qty 1

## 2022-09-14 MED ORDER — POTASSIUM CHLORIDE CRYS ER 10 MEQ PO TBCR
20.0000 meq | EXTENDED_RELEASE_TABLET | Freq: Every day | ORAL | Status: DC
  Administered 2022-09-15 – 2022-09-18 (×4): 20 meq via ORAL
  Filled 2022-09-14 (×4): qty 2

## 2022-09-14 MED ORDER — POLYETHYLENE GLYCOL 3350 17 G PO PACK
17.0000 g | PACK | Freq: Two times a day (BID) | ORAL | Status: DC
  Administered 2022-09-14 – 2022-09-15 (×2): 17 g via ORAL
  Filled 2022-09-14 (×2): qty 1

## 2022-09-14 MED ORDER — NYSTATIN 100000 UNIT/GM EX POWD
1.0000 | Freq: Two times a day (BID) | CUTANEOUS | Status: DC
  Administered 2022-09-14 – 2022-09-18 (×8): 1 via TOPICAL
  Filled 2022-09-14: qty 15

## 2022-09-14 MED ORDER — BACLOFEN 10 MG PO TABS
10.0000 mg | ORAL_TABLET | Freq: Three times a day (TID) | ORAL | Status: DC
  Administered 2022-09-14 – 2022-09-18 (×11): 10 mg via ORAL
  Filled 2022-09-14 (×11): qty 1

## 2022-09-14 MED ORDER — ONDANSETRON HCL 4 MG PO TABS: 4.0000 mg | ORAL_TABLET | Freq: Four times a day (QID) | ORAL | Status: DC | PRN

## 2022-09-14 NOTE — Progress Notes (Signed)
PHARMACY NOTE:  ANTIMICROBIAL RENAL DOSAGE ADJUSTMENT  Current antimicrobial regimen includes a mismatch between antimicrobial dosage and estimated renal function.  As per policy approved by the Pharmacy & Therapeutics and Medical Executive Committees, the antimicrobial dosage will be adjusted accordingly.  Current antimicrobial dosage:  meropenem 1 gm IV q12  Indication: r/o ESBL pyelo/UTI  Renal Function:  Estimated Creatinine Clearance: 79.7 mL/min (by C-G formula based on SCr of 0.84 mg/dL). []      On intermittent HD, scheduled: []      On CRRT    Antimicrobial dosage has been changed to:  Meropenem 1 gm IV q8h  Thank you for allowing pharmacy to be a part of this patient's care.  Eudelia Bunch, Pharm.D Use secure chat for questions 09/14/2022 4:26 PM

## 2022-09-14 NOTE — ED Notes (Signed)
ED TO INPATIENT HANDOFF REPORT  Name/Age/Gender Shannon Garrett 62 y.o. female  Code Status    Code Status Orders  (From admission, onward)           Start     Ordered   09/14/22 1616  Do not attempt resuscitation (DNR)  Continuous       Question Answer Comment  If patient has no pulse and is not breathing Do Not Attempt Resuscitation   If patient has a pulse and/or is breathing: Medical Treatment Goals COMFORT MEASURES: Keep clean/warm/dry, use medication by any route; positioning, wound care and other measures to relieve pain/suffering; use oxygen, suction/manual treatment of airway obstruction for comfort; do not transfer unless for comfort needs.   Consent: Discussion documented in EHR or advanced directives reviewed      09/14/22 1624           Code Status History     Date Active Date Inactive Code Status Order ID Comments User Context   07/01/2022 2127 07/06/2022 2121 DNR [id number]  Rhetta Mura, DO ED   05/20/2020 0159 05/21/2020 2214 DNR [id number]  Elwyn Reach, MD Inpatient   02/15/2020 1900 02/20/2020 2240 DNR [id number]  Donia Ast, RN Inpatient   02/12/2020 1433 02/15/2020 1900 Full Code [id number]  Clovis Riley, MD Inpatient   07/19/2019 0401 07/24/2019 0024 DNR [id number]  Vianne Bulls, MD ED   05/10/2019 1940 05/18/2019 1732 DNR [id number]  Verlee Monte, MD ED   05/10/2019 1431 05/10/2019 1940 DNR [id number]  Lorin Glass, PA-C ED   11/07/2015 2300 11/10/2015 1615 DNR [id number]  Norval Morton, MD ED   07/21/2015 1338 07/25/2015 2137 DNR [id number]  Rush Farmer, MD Inpatient   07/10/2015 2024 07/21/2015 1338 Full Code [id number]  Juanito Doom, MD Inpatient   07/04/2015 1647 07/08/2015 1403 Full Code [id number]  Louellen Molder, MD Inpatient   06/08/2015 2210 06/12/2015 1610 Full Code [id number]  Theressa Millard, MD Inpatient   05/31/2015 0334 06/04/2015 1731 Full Code [id number]  Reubin Milan, MD Inpatient   01/11/2015 0242 01/17/2015  1846 Full Code [id number]  Allyne Gee, MD Inpatient   02/23/2014 2232 01/11/2015 0242 DNR [id number]  Hennie Duos, MD Outpatient   02/20/2014 0358 02/22/2014 1937 DNR [id number]  Toy Baker, MD ED   11/04/2013 1353 02/20/2014 0358 Full Code [id number]  Hennie Duos, MD Outpatient   07/31/2013 1932 08/02/2013 1650 Full Code [id number]  Allie Bossier, MD Inpatient   05/25/2013 0200 05/26/2013 1653 Full Code [id number]  Rise Patience, MD ED   09/10/2012 0211 09/11/2012 1806 Full Code [id number]  Samuella Cota, MD Inpatient   09/04/2012 1605 09/08/2012 2046 Full Code [id number]  Nita Sells, MD Inpatient   04/28/2012 2308 05/01/2012 1928 Full Code [id number]  Domingo Dimes, RN Inpatient       Home/SNF/Other Home  Chief Complaint UTI (urinary tract infection) due to urinary indwelling Foley catheter ([place]) [id number], N39.0]  Level of Care/Admitting Diagnosis ED Disposition     ED Disposition  Admit   Condition  --   [place]: [facility] [[id number]]  Level of Care: Med-Surg [16]  May admit patient to [facility] or [facility] if equivalent level of care is available:: Yes  Covid Evaluation: Asymptomatic - no recent exposure (last 10 days) testing not required  Diagnosis: UTI (urinary tract infection) due to urinary indwelling Foley catheter [facility]) [id number]  Admitting Physician: Hollice Gong, Curlene Labrum [id number]  Attending Physician: Hollice Gong, MIR Kari.[place] [id number]  Certification:: I certify this patient will need inpatient services for at least 2 midnights  Estimated Length of Stay: 4          Medical History Past Medical History:  Diagnosis Date   Adrenal insufficiency ([place])    Anemia of chronic disease    Aortic atherosclerosis ([place])    COVID-19 05/10/2019   Per facility paperwork   Decubitus ulcer    stage II sacral decubitus ulcer.    Depression    DM2 (diabetes mellitus, type 2) ([place])    Glaucoma    Glaucoma    HFrEF  (heart failure with reduced ejection fraction) ([place])    TTE 04/2012: EF 20-25 // TTE 08/2012: EF 50-55 // Cath 10/19/19: Normal Cors // TTE 05/20/20: EF 35-40, trivial MR, mild AI   Hypertension    MS (multiple sclerosis) ([place])    a. Dx'd late 20's. b. Tx with Novantrone, Tysabri, Copaxone previously.   NICM (nonischemic cardiomyopathy) ([place])    cath 10/2019: normal cors // probably due to multiple chemo-Tx agents used for MS   SVT (supraventricular tachycardia)     Allergies Allergies  Allergen Reactions   Sulfonamide Derivatives Hives and Shortness Of Breath    IV Location/Drains/Wounds Patient Lines/Drains/Airways Status     Active Line/Drains/Airways     Name Placement date Placement time Site Days   Peripheral IV 09/14/22 22 G Anterior;Right;Upper Arm 09/14/22  1058  Arm  less than 1   Closed System Drain 1 Right LLQ Bulb (JP) 19 Fr. 02/15/20  0000  LLQ  942   Suprapubic Catheter Latex 16 Fr. 07/03/22  1229  Latex  73   Incision - 2 Ports Abdomen Left;Upper 2: Mid;Lower 07/20/19  1320  -- 1152   Incision - 5 Ports Abdomen Upper;Umbilicus Upper;Mid Left;Upper Left;Lower Right;Mid 02/12/20  1157  -- 945   Pressure Injury 05/11/19 Buttocks Left;Posterior Stage II -  Partial thickness loss of dermis presenting as a shallow open ulcer with a red, pink wound bed without slough. stage two  05/11/19  1015  -- 1222   Pressure Injury 07/02/22 Sacrum Unstageable - Full thickness tissue loss in which the base of the injury is covered by slough (yellow, tan, gray, green or brown) and/or eschar (tan, brown or black) in the wound bed. 07/02/22  0756  -- 74            Labs/Imaging Results for orders placed or performed during the hospital encounter of 09/14/22 (from the past 48 hour(s))  CBC with Differential     Status: Abnormal   Collection Time: 09/14/22 10:30 AM  Result Value Ref Range   WBC 5.9 4.0 - 10.5 K/uL   RBC 4.58 3.87 - 5.11 MIL/uL   Hemoglobin 12.8 12.0 - 15.0 g/dL   HCT  40.1 36.0 - 46.0 %   MCV 87.6 80.0 - 100.0 fL   MCH 27.9 26.0 - 34.0 pg   MCHC 31.9 30.0 - 36.0 g/dL   RDW 15.9 (H) 11.5 - 15.5 %   Platelets 243 150 - 400 K/uL   nRBC 0.0 0.0 - 0.2 %   Neutrophils Relative % 55 %   Neutro Abs 3.3 1.7 - 7.7 K/uL   Lymphocytes Relative 28 %   Lymphs Abs 1.6 0.7 - 4.0 K/uL   Monocytes Relative 6 %   Monocytes Absolute 0.3 0.1 - 1.0 K/uL   Eosinophils Relative 9 %   Eosinophils Absolute 0.5 0.0 - 0.5  K/uL   Basophils Relative 2 %   Basophils Absolute 0.1 0.0 - 0.1 K/uL   Immature Granulocytes 0 %   Abs Immature Granulocytes 0.01 0.00 - 0.07 K/uL    Comment: Performed at [facility], [place] [address] [place], [place] [address]  Comprehensive metabolic panel     Status: Abnormal   Collection Time: 09/14/22 10:30 AM  Result Value Ref Range   Sodium 141 135 - 145 mmol/L   Potassium 4.0 3.5 - 5.1 mmol/L   Chloride 100 98 - 111 mmol/L   CO2 30 22 - 32 mmol/L   Glucose, Bld 82 70 - 99 mg/dL    Comment: Glucose reference range applies only to samples taken after fasting for at least 8 hours.   BUN 19 6 - 20 mg/dL   Creatinine, Ser 0.84 0.44 - 1.00 mg/dL   Calcium 9.2 8.9 - 10.3 mg/dL   Total Protein 7.8 6.5 - 8.1 g/dL   Albumin 4.1 3.5 - 5.0 g/dL   AST 21 15 - 41 U/L   ALT 18 0 - 44 U/L   Alkaline Phosphatase 144 (H) 38 - 126 U/L   Total Bilirubin 0.5 0.3 - 1.2 mg/dL   GFR, Estimated >60 >60 mL/min    Comment: (NOTE) Calculated using the CKD-EPI Creatinine Equation (2021)    Anion gap 11 5 - 15    Comment: Performed at [facility], [place] [address], [place], [place] [address]  Lipase, blood     Status: None   Collection Time: 09/14/22 10:30 AM  Result Value Ref Range   Lipase 26 11 - 51 U/L    Comment: Performed at [facility], [place] [address], [place], [place] [address]  Lactic acid, plasma     Status: None   Collection Time: 09/14/22 10:35 AM  Result Value Ref Range   Lactic Acid,  Venous 1.6 0.5 - 1.9 mmol/L    Comment: Performed at [facility], [place] [address] [place], [place] [address]  Urinalysis, Routine w reflex microscopic -Urine, Suprapubic     Status: Abnormal   Collection Time: 09/14/22 11:16 AM  Result Value Ref Range   Color, Urine YELLOW YELLOW   APPearance CLOUDY (A) CLEAR   Specific Gravity, Urine 1.010 1.005 - 1.030   pH 7.0 5.0 - 8.0   Glucose, UA NEGATIVE NEGATIVE mg/dL   Hgb urine dipstick LARGE (A) NEGATIVE   Bilirubin Urine NEGATIVE NEGATIVE   Ketones, ur NEGATIVE NEGATIVE mg/dL   Protein, ur 30 (A) NEGATIVE mg/dL   Nitrite NEGATIVE NEGATIVE   Leukocytes,Ua LARGE (A) NEGATIVE   RBC / HPF >50 0 - 5 RBC/hpf   WBC, UA >50 0 - 5 WBC/hpf   Bacteria, UA MANY (A) NONE SEEN   Squamous Epithelial / HPF 6-10 0 - 5 /HPF   WBC Clumps PRESENT     Comment: Performed at [facility], [place] [address], [place], [place] [address]   CT ABDOMEN PELVIS W CONTRAST  Result Date: 09/14/2022 CLINICAL DATA:  Bowel obstruction suspected Not proving, dysuria, suprapubic catheter not working with urine leaking around it. Concern for UTI, [place], or bowel obstruction EXAM: CT ABDOMEN AND PELVIS WITH CONTRAST TECHNIQUE: Multidetector CT imaging of the abdomen and pelvis was performed using the standard protocol following bolus administration of intravenous contrast. RADIATION DOSE REDUCTION: This exam was performed according to the departmental dose-optimization program which includes automated exposure control, adjustment of the mA and/or kV according  to patient size and/or use of iterative reconstruction technique. CONTRAST:  135mL OMNIPAQUE IOHEXOL 300 MG/ML  SOLN COMPARISON:  CT AP, 01/05/2022 and 06/21/2019 FINDINGS: Lower chest: Cardiomegaly, with LEFT ventricular enlargement. Trace LEFT basilar and para-aortic atelectasis. Hepatobiliary: No focal liver abnormality is seen. Status post cholecystectomy. Mild biliary ductal distention,  likely physiologic. No overt biliary dilatation. Pancreas: No pancreatic ductal dilatation or surrounding inflammatory changes. Spleen: Normal in size without focal abnormality. Adrenals/Urinary Tract: Adrenal glands are unremarkable. Small (sub-1 cm) hypodense renal cortical hypodensities, likely small cysts. Kidneys are normal, without renal calculi or hydronephrosis. Mild relative urinary bladder wall thickening for degree of distention. No pericystic stranding. Stomach/Bowel: Stomach is within normal limits. Appendix appears normal. Mild diverticulosis of distal transverse colon. Rectal distention with stool. No perirectal stranding. No evidence of bowel wall thickening, distention, or inflammatory changes. Vascular/Lymphatic: Aortic atherosclerosis without aneurysmal dilatation. No enlarged abdominal or pelvic lymph nodes. Reproductive: Large degenerated and calcified fibroid. Adnexa are unremarkable. Other: Laxity of the rectus sheath. Small fat-containing RIGHT inguinal hernia versus cord lipoma. No abdominopelvic ascites. Musculoskeletal: No acute or significant osseous findings. No follow-up is indicated for incidental findings, unless specifically mentioned. IMPRESSION: 1. No acute abdominopelvic process. 2. Rectal distention with stool, correlate for potential impaction. No overt findings to suggest stercoral colitis. 3. Suprapubic catheter within decompressed urinary bladder, with mild bladder wall thickening. Correlate with urinalysis. 4. Colonic diverticulosis and Aortic Atherosclerosis (ICD10-I70.0). Additional incidental, chronic and senescent findings as above. Electronically Signed   By: Michaelle Birks M.D.   On: 09/14/2022 13:40    Pending Labs Unresulted Labs (From admission, onward)     Start     Ordered   09/21/22 0500  Creatinine, serum  (enoxaparin (LOVENOX)    CrCl >/= 30 ml/min)  Weekly,   R     Comments: while on enoxaparin therapy    09/14/22 1624   09/15/22 [id number]  Basic metabolic  panel  Tomorrow morning,   R        09/14/22 1624   09/15/22 0500  CBC  Tomorrow morning,   R        09/14/22 1624   09/14/22 1616  HIV Antibody (routine testing w rflx)  (HIV Antibody (Routine testing w reflex) panel)  Once,   R        09/14/22 1624   09/14/22 1616  Protime-INR  ONCE - STAT,   STAT        09/14/22 1624   09/14/22 1616  CBC  (enoxaparin (LOVENOX)    CrCl >/= 30 ml/min)  Once,   R       Comments: Baseline for enoxaparin therapy IF NOT ALREADY DRAWN.  Notify MD if PLT < 100 K.    09/14/22 1624   09/14/22 1616  Creatinine, serum  (enoxaparin (LOVENOX)    CrCl >/= 30 ml/min)  Once,   R       Comments: Baseline for enoxaparin therapy IF NOT ALREADY DRAWN.    09/14/22 1624   09/14/22 1004  Urine Culture  Once,   URGENT       Question:  Indication  Answer:  Suprapubic pain   09/14/22 1005   09/14/22 1004  Blood culture (routine x 2)  BLOOD CULTURE X 2,   R (with STAT occurrences)      09/14/22 1005            Vitals/Pain Today's Vitals   09/14/22 1230 09/14/22 1338 09/14/22 1619 09/14/22 1630  BP: (!) 132/94  123/73 117/82  Pulse: 87  78 79  Resp: 17  17   Temp:  98.5 F (36.9 C) 97.9 F (36.6 C)   TempSrc:  Oral Oral   SpO2: 100%  100% 100%  Weight:      Height:      PainSc:        Isolation Precautions No active isolations  Medications Medications  meropenem (MERREM) 1 g in sodium chloride 0.9 % 100 mL IVPB (has no administration in time range)  sodium phosphate (FLEET) 7-19 GM/118ML enema 1 enema (has no administration in time range)  acetaminophen (TYLENOL) tablet 650 mg (has no administration in time range)  aspirin EC tablet 81 mg (has no administration in time range)  traMADol (ULTRAM) tablet 100 mg (has no administration in time range)  furosemide (LASIX) tablet 20 mg (has no administration in time range)  losartan (COZAAR) tablet 25 mg (has no administration in time range)  simvastatin (ZOCOR) tablet 10 mg (has no administration in time  range)  buPROPion (WELLBUTRIN XL) 24 hr tablet 150 mg (has no administration in time range)  DULoxetine (CYMBALTA) DR capsule 60 mg (has no administration in time range)  linaclotide (LINZESS) capsule 290 mcg (has no administration in time range)  senna-docusate (Senokot-S) tablet 2 tablet (has no administration in time range)  amantadine (SYMMETREL) capsule 100 mg (has no administration in time range)  baclofen (LIORESAL) tablet 10 mg (has no administration in time range)  gabapentin (NEURONTIN) tablet 600 mg (has no administration in time range)  Potassium Chloride ER TBCR 20 mEq (has no administration in time range)  magnesium oxide (MAG-OX) tablet 400 mg (has no administration in time range)  budesonide (PULMICORT) nebulizer solution 0.25 mg (has no administration in time range)  fluticasone (FLONASE) 50 MCG/ACT nasal spray 1 spray (has no administration in time range)  ipratropium-albuterol (DUONEB) 0.5-2.5 (3) MG/3ML nebulizer solution 3 mL (has no administration in time range)  latanoprost (XALATAN) 0.005 % ophthalmic solution 1 drop (has no administration in time range)  nystatin (MYCOSTATIN/NYSTOP) topical powder 1 Application (has no administration in time range)  lactated ringers infusion (has no administration in time range)  enoxaparin (LOVENOX) injection 40 mg (has no administration in time range)  oxyCODONE (Oxy IR/ROXICODONE) immediate release tablet 5 mg (has no administration in time range)  traZODone (DESYREL) tablet 25 mg (has no administration in time range)  docusate sodium (COLACE) capsule 100 mg (has no administration in time range)  polyethylene glycol (MIRALAX / GLYCOLAX) packet 17 g (has no administration in time range)  ondansetron (ZOFRAN) tablet 4 mg (has no administration in time range)    Or  ondansetron (ZOFRAN) injection 4 mg (has no administration in time range)  meropenem (MERREM) 1 g in sodium chloride 0.9 % 100 mL IVPB (0 g Intravenous Stopped 09/14/22  1303)  iohexol (OMNIPAQUE) 300 MG/ML solution 100 mL (100 mLs Intravenous Contrast Given 09/14/22 1259)  oxyCODONE-acetaminophen (PERCOCET/ROXICET) 5-325 MG per tablet 1 tablet (1 tablet Oral Given 09/14/22 1441)    Mobility non-ambulatory

## 2022-09-14 NOTE — Progress Notes (Addendum)
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   [facility] Liaison to follow pt during this hospitalization.    Please do not hesitate to outreach [facility] with any questions.     Gar Ponto, RN  RN PRN/Hospital Liaison  [phone]

## 2022-09-14 NOTE — ED Notes (Signed)
Suprapubic catheter changed by Dr Sherry Ruffing. Assisted him with procedure. Patient tolerated well, urine noted to the catheter. Sent specimen for UA and culture.

## 2022-09-14 NOTE — ED Provider Notes (Signed)
[place] EMERGENCY DEPARTMENT AT [facility] Provider Note   CSN: [id number] Arrival date & time: 09/14/22  [id number]     History  Chief Complaint  Patient presents with   Abdominal Pain   Urinary Catheter Problem    Shannon Garrett is a 62 y.o. female.  The history is provided by the patient and medical records. No language interpreter was used.  Abdominal Pain Pain location:  Generalized Pain quality: aching, burning and cramping   Pain radiates to:  Does not radiate Pain severity:  Moderate Onset quality:  Gradual Duration:  2 weeks Timing:  Constant Progression:  Waxing and waning Chronicity:  Recurrent Relieved by:  Nothing Worsened by:  Nothing Associated symptoms: chills, constipation, dysuria and fatigue   Associated symptoms: no chest pain, no cough, no diarrhea, no fever, no nausea, no shortness of breath, no vaginal bleeding, no vaginal discharge and no vomiting        Home Medications Prior to Admission medications   Medication Sig Start Date End Date Taking? Authorizing Provider  acetaminophen (TYLENOL) 325 MG tablet Take 650 mg by mouth every 8 (eight) hours as needed (pain, fever > 100.5).    [provider]  albuterol (PROVENTIL) (2.5 MG/3ML) 0.083% nebulizer solution Take 2.5 mg by nebulization every 6 (six) hours as needed for wheezing or shortness of breath.    [provider]  albuterol (VENTOLIN HFA) 108 (90 Base) MCG/ACT inhaler Inhale 2 puffs into the lungs every 6 (six) hours as needed for wheezing or shortness of breath.  07/10/19   [provider]  amantadine (SYMMETREL) 100 MG capsule Take 100 mg by mouth in the morning.    [provider]  ascorbic acid (VITAMIN C) 500 MG tablet Take 500 mg by mouth in the morning.    [provider]  aspirin EC 81 MG tablet Take 1 tablet (81 mg total) by mouth daily. 07/06/22   Domenic Polite, MD  AZO-CRANBERRY PO Take 2 each by mouth in the morning.     [provider]  baclofen (LIORESAL) 10 MG tablet Take 10 mg by mouth 3 (three) times daily. Hold if lethargic or confused    [provider]  bisacodyl (DULCOLAX) 10 MG suppository Place 10 mg rectally daily as needed for moderate constipation. If no results for MOM    [provider]  budesonide (PULMICORT) 0.25 MG/2ML nebulizer solution Take 2 mLs (0.25 mg total) by nebulization 2 (two) times daily. 07/31/22   Freddi Starr, MD  buPROPion (WELLBUTRIN XL) 150 MG 24 hr tablet Take 150 mg by mouth in the morning.    [provider]  DULoxetine (CYMBALTA) 20 MG capsule Take 60 mg by mouth in the morning.    [provider]  Ensure (ENSURE) Take 237 mLs by mouth 3 (three) times daily.    [provider]  ferrous sulfate 325 (65 FE) MG tablet Take 325 mg by mouth 2 (two) times daily with a meal.  02/20/18   [provider]  fluticasone (FLONASE) 50 MCG/ACT nasal spray Place 1 spray into both nostrils at bedtime.    [provider]  furosemide (LASIX) 20 MG tablet Take 1 tablet (20 mg total) by mouth 2 (two) times daily. MAY TAKE ADDITIONAL 30MG  PRN WEIGHT GAIN OR SWELLING 07/18/22   Deberah Pelton, NP  gabapentin (NEURONTIN) 600 MG tablet Take 1 tablet (600 mg total) by mouth 3 (three) times daily. 07/06/22   Domenic Polite, MD  insulin aspart (NOVOLOG FLEXPEN) 100 UNIT/ML FlexPen 0-15 Units, Subcutaneous, 3 times daily with meals CBG < 70: implement hypoglycemia protocol-call MD CBG 70 - 120: 0 units CBG 121 - 150: 2 units CBG 151 - 200: 3 units CBG 201 - 250: 5 units CBG 251 - 300: 8 units CBG 301 - 350: 11 units CBG 351 - 400: 15 units CBG > 400: Patient taking differently: Inject 0-15 Units into the skin 3 (three) times daily with meals. 0-15 Units, Subcutaneous, 3 times daily with meals per sliding scale : CBG < 70: implement hypoglycemia protocol-call MD CBG 0 - 120 : 0 units CBG 121 - 150: 2 units CBG 151 - 200: 3  units CBG 201 - 250: 5 units CBG 251 - 300: 8 units CBG 301 - 350: 11 units CBG 351 - 400: 15 units CBG >400, CALL MD 05/18/19   Jonetta Osgood, MD  ipratropium-albuterol (DUONEB) 0.5-2.5 (3) MG/3ML SOLN Inhale 3 mLs into the lungs every 8 (eight) hours as needed (wheezing, shortness of breath).    [provider]  latanoprost (XALATAN) 0.005 % ophthalmic solution Place 1 drop into both eyes at bedtime.    [provider]  linaclotide (LINZESS) 290 MCG CAPS capsule Take 290 mcg by mouth daily before breakfast.    [provider]  losartan (COZAAR) 25 MG tablet Take 1 tablet (25 mg total) by mouth daily. 07/07/22   Domenic Polite, MD  magnesium oxide (MAG-OX) 400 (240 Mg) MG tablet Take 1 tablet (400 mg total) by mouth daily. 07/07/22   Domenic Polite, MD  Melatonin 5 MG TABS Take 10 mg by mouth at bedtime.  11/14/17   [provider]  metoprolol succinate (TOPROL-XL) 50 MG 24 hr tablet Take 1.5 tablets (75 mg total) by mouth in the morning. Patient taking differently: Take 100 mg by mouth in the morning. 07/06/22   Domenic Polite, MD  Multiple Vitamins-Minerals (MULTIVITAMIN ADULT PO) Take 1 tablet by mouth in the morning.    [provider]  NONFORMULARY OR COMPOUNDED ITEM Apply 1 application  topically 3 (three) times daily. A&D/Balmex/Lidocaine 1:1:1. Apply to vulva 3 times daily.    [provider]  nystatin (MYCOSTATIN/NYSTOP) powder Apply 1 Application topically 2 (two) times daily. To vagina, armpits    [provider]  ondansetron (ZOFRAN) 4 MG tablet Take 4 mg by mouth every 6 (six) hours as needed for nausea or vomiting. 11/01/17   [provider]  OXYGEN Inhale 2-4 L into the lungs as needed (O2 sats <93%).    [provider]  polyethylene glycol (MIRALAX MIX-IN PAX) 17 g packet Take 17 g by mouth daily. Patient taking differently: Take 17 g by mouth in the morning. 05/21/20   Donne Hazel, MD  Potassium  Chloride ER 20 MEQ TBCR Take 20 mEq by mouth in the morning. 05/17/20   [provider]  Probiotic Product (PROBIOTIC PO) Take 1 capsule by mouth 2 (two) times daily.    [provider]  senna-docusate (SENOKOT-S) 8.6-50 MG tablet Take 2 tablets by mouth 2 (two) times daily.    [provider]  simethicone (MYLICON) 0000000 MG chewable tablet Chew 125 mg by mouth every 6 (six) hours as needed for flatulence.    [provider]  simvastatin (ZOCOR) 10 MG tablet Take 10 mg by mouth at bedtime.    [provider]  spironolactone (ALDACTONE) 25 MG tablet Take 25 mg by mouth in the morning. 05/02/20  [provider]  traMADol (ULTRAM) 50 MG tablet Take 2 tablets (100 mg total) by mouth 2 (two) times daily as needed (pain). Patient taking differently: Take 100 mg by mouth at bedtime. 07/06/22   Domenic Polite, MD      Allergies    Sulfonamide derivatives and Penicillins    Review of Systems   Review of Systems  Constitutional:  Positive for chills and fatigue. Negative for fever.  HENT:  Negative for congestion.   Respiratory:  Negative for cough, chest tightness, shortness of breath and wheezing.   Cardiovascular:  Negative for chest pain, palpitations and leg swelling.  Gastrointestinal:  Positive for abdominal pain and constipation. Negative for diarrhea, nausea and vomiting.  Genitourinary:  Positive for difficulty urinating and dysuria. Negative for vaginal bleeding and vaginal discharge.  Musculoskeletal:  Positive for back pain. Negative for neck pain and neck stiffness.  Skin:  Negative for rash and wound.  Neurological:  Negative for headaches.  Psychiatric/Behavioral:  Negative for agitation.     Physical Exam Updated Vital Signs BP (!) 152/99 (BP Location: Right Arm)   Pulse 95   Temp 98.2 F (36.8 C) (Oral)   Resp 16   Ht 5\' 5"  (1.651 m)   Wt 89.4 kg   SpO2 100%   BMI 32.78 kg/m  Physical Exam Vitals and nursing note  reviewed.  Constitutional:      General: She is not in acute distress.    Appearance: She is well-developed. She is not ill-appearing, toxic-appearing or diaphoretic.  HENT:     Head: Normocephalic and atraumatic.     Mouth/Throat:     Mouth: Mucous membranes are moist.  Eyes:     Extraocular Movements: Extraocular movements intact.     Conjunctiva/sclera: Conjunctivae normal.  Cardiovascular:     Rate and Rhythm: Normal rate and regular rhythm.     Heart sounds: No murmur heard. Pulmonary:     Effort: Pulmonary effort is normal. No respiratory distress.     Breath sounds: Normal breath sounds. No wheezing, rhonchi or rales.  Chest:     Chest wall: No tenderness.  Abdominal:     General: Abdomen is flat. Bowel sounds are normal. There is no distension.     Palpations: Abdomen is soft.     Tenderness: There is generalized abdominal tenderness. There is no guarding or rebound.    Musculoskeletal:        General: No swelling.     Cervical back: Neck supple.  Skin:    General: Skin is warm and dry.     Capillary Refill: Capillary refill takes less than 2 seconds.     Findings: No rash.  Neurological:     Mental Status: She is alert.  Psychiatric:        Mood and Affect: Mood normal.     ED Results / Procedures / Treatments   Labs (all labs ordered are listed, but only abnormal results are displayed) Labs Reviewed  URINALYSIS, ROUTINE W REFLEX MICROSCOPIC - Abnormal; Notable for the following components:      Result Value   APPearance CLOUDY (*)    Hgb urine dipstick LARGE (*)    Protein, ur 30 (*)    Leukocytes,Ua LARGE (*)    Bacteria, UA MANY (*)    All other components within normal limits  CBC WITH DIFFERENTIAL/PLATELET - Abnormal; Notable for the following components:   RDW 15.9 (*)    All other components within normal limits  COMPREHENSIVE METABOLIC  PANEL - Abnormal; Notable for the following components:   Alkaline Phosphatase 144 (*)    All other components  within normal limits  URINE CULTURE  CULTURE, BLOOD (ROUTINE X 2)  CULTURE, BLOOD (ROUTINE X 2)  LACTIC ACID, PLASMA  LIPASE, BLOOD    EKG None  Radiology CT ABDOMEN PELVIS W CONTRAST  Result Date: 09/14/2022 CLINICAL DATA:  Bowel obstruction suspected Not proving, dysuria, suprapubic catheter not working with urine leaking around it. Concern for UTI, [place], or bowel obstruction EXAM: CT ABDOMEN AND PELVIS WITH CONTRAST TECHNIQUE: Multidetector CT imaging of the abdomen and pelvis was performed using the standard protocol following bolus administration of intravenous contrast. RADIATION DOSE REDUCTION: This exam was performed according to the departmental dose-optimization program which includes automated exposure control, adjustment of the mA and/or kV according to patient size and/or use of iterative reconstruction technique. CONTRAST:  152mL OMNIPAQUE IOHEXOL 300 MG/ML  SOLN COMPARISON:  CT AP, 01/05/2022 and 06/21/2019 FINDINGS: Lower chest: Cardiomegaly, with LEFT ventricular enlargement. Trace LEFT basilar and para-aortic atelectasis. Hepatobiliary: No focal liver abnormality is seen. Status post cholecystectomy. Mild biliary ductal distention, likely physiologic. No overt biliary dilatation. Pancreas: No pancreatic ductal dilatation or surrounding inflammatory changes. Spleen: Normal in size without focal abnormality. Adrenals/Urinary Tract: Adrenal glands are unremarkable. Small (sub-1 cm) hypodense renal cortical hypodensities, likely small cysts. Kidneys are normal, without renal calculi or hydronephrosis. Mild relative urinary bladder wall thickening for degree of distention. No pericystic stranding. Stomach/Bowel: Stomach is within normal limits. Appendix appears normal. Mild diverticulosis of distal transverse colon. Rectal distention with stool. No perirectal stranding. No evidence of bowel wall thickening, distention, or inflammatory changes. Vascular/Lymphatic: Aortic atherosclerosis  without aneurysmal dilatation. No enlarged abdominal or pelvic lymph nodes. Reproductive: Large degenerated and calcified fibroid. Adnexa are unremarkable. Other: Laxity of the rectus sheath. Small fat-containing RIGHT inguinal hernia versus cord lipoma. No abdominopelvic ascites. Musculoskeletal: No acute or significant osseous findings. No follow-up is indicated for incidental findings, unless specifically mentioned. IMPRESSION: 1. No acute abdominopelvic process. 2. Rectal distention with stool, correlate for potential impaction. No overt findings to suggest stercoral colitis. 3. Suprapubic catheter within decompressed urinary bladder, with mild bladder wall thickening. Correlate with urinalysis. 4. Colonic diverticulosis and Aortic Atherosclerosis (ICD10-I70.0). Additional incidental, chronic and senescent findings as above. Electronically Signed   By: Michaelle Birks M.D.   On: 09/14/2022 13:40    Procedures SUPRAPUBIC TUBE PLACEMENT  Date/Time: 09/14/2022 11:26 AM  Performed by: Courtney Paris, MD Authorized by: Courtney Paris, MD   Consent:    Consent obtained:  Verbal   Consent given by:  Patient   Risks, benefits, and alternatives were discussed: yes     Risks discussed:  Infection and pain   Alternatives discussed:  No treatment Universal protocol:    Immediately prior to procedure, a time out was called: yes     Patient identity confirmed:  Verbally with patient Sedation:    Sedation type:  None Anesthesia:    Anesthesia method:  None Procedure details:    Complexity:  Simple   Catheter type:  Foley   Catheter size:  16 Fr   Number of attempts:  1   Urine characteristics:  Cloudy and foul-smelling Post-procedure details:    Procedure completion:  Tolerated     CRITICAL CARE Performed by: Gwenyth Allegra Nicholes Hibler Total critical care time: 20 minutes Critical care time was exclusive of separately billable procedures and treating other patients. Critical care was  necessary to treat or  prevent imminent or life-threatening deterioration. Critical care was time spent personally by me on the following activities: development of treatment plan with patient and/or surrogate as well as nursing, discussions with consultants, evaluation of patient's response to treatment, examination of patient, obtaining history from patient or surrogate, ordering and performing treatments and interventions, ordering and review of laboratory studies, ordering and review of radiographic studies, pulse oximetry and re-evaluation of patient's condition. Medications Ordered in ED Medications  oxyCODONE-acetaminophen (PERCOCET/ROXICET) 5-325 MG per tablet 1 tablet (has no administration in time range)  meropenem (MERREM) 1 g in sodium chloride 0.9 % 100 mL IVPB (1 g Intravenous New Bag/Given 09/14/22 1233)  iohexol (OMNIPAQUE) 300 MG/ML solution 100 mL (100 mLs Intravenous Contrast Given 09/14/22 1259)    ED Course/ Medical Decision Making/ A&P                             Medical Decision Making Amount and/or Complexity of Data Reviewed Labs: ordered. Radiology: ordered.  Risk Prescription drug management. Decision regarding hospitalization.    Shannon Garrett is a 62 y.o. female with a past medical history significant for MS with neurogenic bladder and chronic she review of catheter use, CHF, paraplegia, Addison's disease, previous cholecystitis status postcholecystectomy, SVT, hypertension, depression, and report of recurrent urinary tract infections who presents with urinary catheter problem, dysuria, foul-smelling urine, decreased bowel movement, and abdominal pain.  According to patient, she was told several weeks ago she had a UTI but it was resistant so they did not give her antibiotics.  She reports they are waiting on a culture but she never heard the results of this.  She reports she is continue to have abdominal pain and problems with the catheter.  She reports that a week  ago was exchanged and once again it is now not draining through the catheter but she is having leakage of urine from around the catheter site on her abdomen as well as from her vagina.  She reports it is burning and it does have a foul smell.  She denies fevers or chills but reports does not get them when she gets sick.  She has had some pain going to her flanks bilaterally is concerned about this being the infection.  She denies nausea and vomiting and denies any URI symptoms such as congestion or cough.  She reports no chronic shortness of breath related to her heart failure.  Denies trauma.  Reports has not a bowel movement in some time and feels her abdomen is distended and painful.  On exam, lungs were clear.  Chest nontender.  Abdomen was diffusely tender.  Suprapubic catheter was examined and catheter does not appear to be draining.  There is old appearing sediment inside the catheter tubing.  Otherwise patient resting comfortably and is not hypoxic or hypotensive.  She is afebrile.  We will exchange her suprapubic catheter as I suspect it is clogged and not functioning again.  Once we exchange, we will get a urinalysis and the culture.  Previous culture was examined and she does appear to have some resistant bacteria in the past.  Given her symptoms I am concerned about possible early pyelonephritis with the pain going more to her flanks as well as the diffuse abdominal pain.  We will get a CT scan to rule out obstruction given the constipation side of things as well.  If there is no obstruction, will consider enema.  Patient is not reporting rectal  pain however.  Anticipate reassessment after workup to determine disposition but if patient indeed has a resistant UTI with continued and worsening symptoms, patient may require admission for IV antibiotics depending on what pharmacy says.  11:22 AM Suprapubic catheter was exchanged without difficulty and urine was draining through the catheter and into  the bag.  Will get a clean sample for urinalysis.  Will wait for labs to return and then will discuss disposition with pharmacy.  Will wait for CT imaging as well as abdomen pelvis.  2:29 PM CT scan does not show obstruction.  It does show constipation and stool ball.  Will offer enema.  Patient requested a pain pill and she can eat and drink now.  Will give this.  CT scan did show evidence of thickened bladder wall which fits with the UTI we discovered.  I spoke to pharmacy who recommended IV antibiotics and admission given the sensitivities.  Patient received dose IV antibiotics and will call for admission for further UTI/early pyelonephritis treatment.          Final Clinical Impression(s) / ED Diagnoses Final diagnoses:  Generalized abdominal pain  Bilateral flank pain  Pyelonephritis  Suprapubic catheter dysfunction, initial encounter ([place])  Urinary tract infection without hematuria, site unspecified     Clinical Impression: 1. Generalized abdominal pain   2. Bilateral flank pain   3. Pyelonephritis   4. Suprapubic catheter dysfunction, initial encounter ([place])   5. Urinary tract infection without hematuria, site unspecified     Disposition: Admit  This note was prepared with assistance of Dragon voice recognition software. Occasional wrong-word or sound-a-like substitutions may have occurred due to the inherent limitations of voice recognition software.     Glenard Keesling, Gwenyth Allegra, MD 09/14/22 [phone]

## 2022-09-14 NOTE — H&P (Addendum)
History and Physical  Shannon Garrett [id number] DOB: 31-Dec-1960 DOA: 09/14/2022   PCP: Pcp, No   Patient coming from:   Chief Complaint: Abd Pain and Suprapubic Catheter Malfunction   HPI: Shannon Garrett is a 62 y.o. female with medical history significant for multiple sclerosis, non-insulin-dependent type 2 diabetes, heart failure with reduced ejection fraction, bedbound status who is a resident of a local nursing home on hospice care admitted to the hospital with catheter associated UTI.  The patient has had multiple UTIs in the past, she has catheter associated UTI, it was discovered several weeks ago according to the patient.  She has been waiting to initiate antibiotic therapy.  In the meantime, over the last few days, her been difficulty with her suprapubic catheter, with clogging and urine leaking around the site.  She came to the ER for evaluation for this reason.  ED Course: Here in the emergency department, her lab work and vital signs are normal.  Urine catheter was exchanged by the ER provider, and a clean sample was obtained.  This is consistent with possible urinary tract infection.  Based on the patient previous cultures, she was started on IV meropenem.  She is stable and comfortable and has no complaints.  She wants to go home, but was explained to her that we need to wait for culture data before we are able to discharge her from the hospital.  Review of Systems: Please see HPI for pertinent positives and negatives. A complete 10 system review of systems are otherwise negative.  Past Medical History:  Diagnosis Date   Adrenal insufficiency ([place])    Anemia of chronic disease    Aortic atherosclerosis ([place])    COVID-19 05/10/2019   Per facility paperwork   Decubitus ulcer    stage II sacral decubitus ulcer.    Depression    DM2 (diabetes mellitus, type 2) ([place])    Glaucoma    Glaucoma    HFrEF (heart failure with reduced ejection fraction) ([facility])    TTE 04/2012: EF  20-25 // TTE 08/2012: EF 50-55 // Cath 10/19/19: Normal Cors // TTE 05/20/20: EF 35-40, trivial MR, mild AI   Hypertension    MS (multiple sclerosis) ([place])    a. Dx'd late 20's. b. Tx with Novantrone, Tysabri, Copaxone previously.   NICM (nonischemic cardiomyopathy) ([place])    cath 10/2019: normal cors // probably due to multiple chemo-Tx agents used for MS   SVT (supraventricular tachycardia)    Past Surgical History:  Procedure Laterality Date   ABLATION     uterine   BILIARY DILATION  02/15/2020   Procedure: BILIARY DILATION;  Surgeon: Rush Landmark Telford Nab., MD;  Location: [facility];  Service: Gastroenterology;;   CESAREAN SECTION     CHOLECYSTECTOMY N/A 07/20/2019   Procedure: DIAGNOSTIC LAPAROSCOPY;  Surgeon: [name], MD;  Location: [facility];  Service: General;  Laterality: N/A;   CHOLECYSTECTOMY N/A 02/12/2020   Procedure: LAPAROSCOPIC CHOLECYSTECTOMY;  Surgeon: Clovis Riley, MD;  Location: [facility];  Service: General;  Laterality: N/A;   ERCP N/A 02/15/2020   Procedure: ENDOSCOPIC RETROGRADE CHOLANGIOPANCREATOGRAPHY (ERCP);  Surgeon: Irving Copas., MD;  Location: [facility];  Service: Gastroenterology;  Laterality: N/A;   IR EXCHANGE BILIARY DRAIN  09/07/2019   IR PERC CHOLECYSTOSTOMY  07/21/2019   LEFT HEART CATH AND CORONARY ANGIOGRAPHY N/A 10/29/2019   Procedure: LEFT HEART CATH AND CORONARY ANGIOGRAPHY;  Surgeon: Leonie Man, MD;  Location: [facility];  Service: Cardiovascular;  Laterality: N/A;  REMOVAL OF STONES  02/15/2020   Procedure: REMOVAL OF STONES;  Surgeon: Rush Landmark Telford Nab., MD;  Location: [facility];  Service: Gastroenterology;;   Joan Mayans  02/15/2020   Procedure: Joan Mayans;  Surgeon: Mansouraty, Telford Nab., MD;  Location: [facility];  Service: Gastroenterology;;    Social History:  reports that she has quit smoking. Her smoking use included cigarettes. She has a 2.50 pack-year smoking history. She has never used  smokeless tobacco. She reports that she does not drink alcohol and does not use drugs.   Allergies  Allergen Reactions   Sulfonamide Derivatives Hives and Shortness Of Breath    Family History  Problem Relation Age of Onset   Hypertension Mother    Cancer Mother        breast    Cancer Father        prostate   Multiple sclerosis Sister    Heart attack Neg Hx      Prior to Admission medications   Medication Sig Start Date End Date Taking? Authorizing Provider  acetaminophen (TYLENOL) 325 MG tablet Take 650 mg by mouth every 8 (eight) hours as needed (pain, fever > 100.5).    [provider]  albuterol (PROVENTIL) (2.5 MG/3ML) 0.083% nebulizer solution Take 2.5 mg by nebulization every 6 (six) hours as needed for wheezing or shortness of breath.    [provider]  albuterol (VENTOLIN HFA) 108 (90 Base) MCG/ACT inhaler Inhale 2 puffs into the lungs every 6 (six) hours as needed for wheezing or shortness of breath.  07/10/19   [provider]  amantadine (SYMMETREL) 100 MG capsule Take 100 mg by mouth in the morning.    [provider]  ascorbic acid (VITAMIN C) 500 MG tablet Take 500 mg by mouth in the morning.    [provider]  aspirin EC 81 MG tablet Take 1 tablet (81 mg total) by mouth daily. 07/06/22   Domenic Polite, MD  AZO-CRANBERRY PO Take 2 each by mouth in the morning.    [provider]  baclofen (LIORESAL) 10 MG tablet Take 10 mg by mouth 3 (three) times daily. Hold if lethargic or confused    [provider]  bisacodyl (DULCOLAX) 10 MG suppository Place 10 mg rectally daily as needed for moderate constipation. If no results for MOM    [provider]  budesonide (PULMICORT) 0.25 MG/2ML nebulizer solution Take 2 mLs (0.25 mg total) by nebulization 2 (two) times daily. 07/31/22   Freddi Starr, MD  buPROPion (WELLBUTRIN XL) 150 MG 24 hr tablet Take 150 mg by mouth in the morning.    [provider]  DULoxetine (CYMBALTA) 20 MG capsule Take 60 mg by mouth in the morning.    [provider]  Ensure (ENSURE) Take 237 mLs by mouth 3 (three) times daily.    [provider]  ferrous sulfate 325 (65 FE) MG tablet Take 325 mg by mouth 2 (two) times daily with a meal.  02/20/18   [provider]  fluticasone (FLONASE) 50 MCG/ACT nasal spray Place 1 spray into both nostrils at bedtime.    [provider]  furosemide (LASIX) 20 MG tablet Take 1 tablet (20 mg total) by mouth 2 (two) times daily. MAY TAKE ADDITIONAL 30MG  PRN WEIGHT GAIN OR SWELLING 07/18/22   Deberah Pelton, NP  gabapentin (NEURONTIN) 600 MG tablet Take 1 tablet (600 mg total) by mouth 3 (three) times daily. 07/06/22   Domenic Polite, MD  insulin aspart (  NOVOLOG FLEXPEN) 100 UNIT/ML FlexPen 0-15 Units, Subcutaneous, 3 times daily with meals CBG < 70: implement hypoglycemia protocol-call MD CBG 70 - 120: 0 units CBG 121 - 150: 2 units CBG 151 - 200: 3 units CBG 201 - 250: 5 units CBG 251 - 300: 8 units CBG 301 - 350: 11 units CBG 351 - 400: 15 units CBG > 400: Patient taking differently: Inject 0-15 Units into the skin 3 (three) times daily with meals. 0-15 Units, Subcutaneous, 3 times daily with meals per sliding scale : CBG < 70: implement hypoglycemia protocol-call MD CBG 0 - 120 : 0 units CBG 121 - 150: 2 units CBG 151 - 200: 3 units CBG 201 - 250: 5 units CBG 251 - 300: 8 units CBG 301 - 350: 11 units CBG 351 - 400: 15 units CBG >400, CALL MD 05/18/19   Jonetta Osgood, MD  ipratropium-albuterol (DUONEB) 0.5-2.5 (3) MG/3ML SOLN Inhale 3 mLs into the lungs every 8 (eight) hours as needed (wheezing, shortness of breath).    [provider]  latanoprost (XALATAN) 0.005 % ophthalmic solution Place 1 drop into both eyes at bedtime.    [provider]  linaclotide (LINZESS) 290 MCG CAPS capsule Take 290 mcg by mouth daily before breakfast.    [provider]  losartan (COZAAR) 25 MG tablet Take 1 tablet (25 mg total) by mouth daily. 07/07/22   Domenic Polite, MD  magnesium oxide (MAG-OX) 400 (240 Mg) MG tablet Take 1 tablet (400 mg total) by mouth daily. 07/07/22   Domenic Polite, MD  Melatonin 5 MG TABS Take 10 mg by mouth at bedtime.  11/14/17   [provider]  metoprolol succinate (TOPROL-XL) 50 MG 24 hr tablet Take 1.5 tablets (75 mg total) by mouth in the morning. Patient taking differently: Take 100 mg by mouth in the morning. 07/06/22   Domenic Polite, MD  Multiple Vitamins-Minerals (MULTIVITAMIN ADULT PO) Take 1 tablet by mouth in the morning.    [provider]  NONFORMULARY OR COMPOUNDED ITEM Apply 1 application  topically 3 (three) times daily. A&D/Balmex/Lidocaine 1:1:1. Apply to vulva 3 times daily.    [provider]  nystatin (MYCOSTATIN/NYSTOP) powder Apply 1 Application topically 2 (two) times daily. To vagina, armpits    [provider]  ondansetron (ZOFRAN) 4 MG tablet Take 4 mg by mouth every 6 (six) hours as needed for nausea or vomiting. 11/01/17   [provider]  OXYGEN Inhale 2-4 L into the lungs as needed (O2 sats <93%).    [provider]  polyethylene glycol (MIRALAX MIX-IN PAX) 17 g packet Take 17 g by mouth daily. Patient taking differently: Take 17 g by mouth in the morning. 05/21/20   Donne Hazel, MD  Potassium Chloride ER 20 MEQ TBCR Take 20 mEq by mouth in the morning. 05/17/20   [provider]  Probiotic Product (PROBIOTIC PO) Take 1 capsule by mouth 2 (two) times daily.    [provider]  senna-docusate (SENOKOT-S) 8.6-50 MG tablet Take 2 tablets by mouth 2 (two) times daily.    [provider]  simethicone (MYLICON) 0000000 MG chewable tablet Chew 125 mg by mouth every 6 (six) hours as needed for flatulence.    [provider]  simvastatin (ZOCOR) 10 MG tablet Take 10 mg by mouth at bedtime.    [provider]  spironolactone (ALDACTONE) 25 MG tablet Take 25 mg by mouth in the morning. 05/02/20   [provider]  traMADol (ULTRAM) 50 MG tablet Take 2 tablets (100 mg total) by mouth 2 (two) times daily as needed (pain). Patient taking differently: Take 100 mg by mouth at bedtime. 07/06/22   Domenic Polite, MD    Physical Exam: BP 123/73 (BP Location: Right Arm)   Pulse 78   Temp 97.9 F (36.6 C) (Oral)   Resp 17   Ht 5\' 5"  (1.651 m)   Wt 89.4 kg   SpO2 100%   BMI 32.78 kg/m   General:  Alert, oriented, calm, in no acute distress, pleasant female appearing slightly older than her stated age sitting in bed watching TV and eating some peanut butter with 1 hand Eyes: EOMI, clear conjuctivae, white sclerea Neck: supple, no masses, trachea mildline  Cardiovascular: RRR, no murmurs or rubs, no peripheral edema  Respiratory: clear to auscultation bilaterally, no wheezes, no crackles  Abdomen: soft, nontender, nondistended, normal bowel tones heard  Skin: dry, no rashes  Musculoskeletal: no joint effusions, normal range of motion  Psychiatric: appropriate affect, normal speech  Neurologic: Paraplegia, speech is clear, alert and oriented x 4.         Labs on Admission:  Basic Metabolic Panel: Recent Labs  Lab 09/14/22 1030  NA 141  K 4.0  CL 100  CO2 30  GLUCOSE 82  BUN 19  CREATININE 0.84  CALCIUM 9.2   Liver Function Tests: Recent Labs  Lab 09/14/22 1030  AST 21  ALT 18  ALKPHOS 144*  BILITOT 0.5  PROT 7.8  ALBUMIN 4.1   Recent Labs  Lab 09/14/22 1030  LIPASE 26   No results for input(s): "AMMONIA" in the last 168 hours. CBC: Recent Labs  Lab 09/14/22 1030  WBC 5.9  NEUTROABS 3.3  HGB 12.8  HCT 40.1  MCV 87.6  PLT 243   Cardiac Enzymes: No results for input(s): "CKTOTAL", "CKMB", "CKMBINDEX", "TROPONINI" in the last 168 hours.  BNP (last 3 results) Recent Labs    05/01/22 1610 07/01/22 1617  BNP 1,272.9* 3,102.7*    ProBNP (last 3  results) No results for input(s): "PROBNP" in the last 8760 hours.  CBG: No results for input(s): "GLUCAP" in the last 168 hours.  Radiological Exams on Admission: CT ABDOMEN PELVIS W CONTRAST  Result Date: 09/14/2022 CLINICAL DATA:  Bowel obstruction suspected Not proving, dysuria, suprapubic catheter not working with urine leaking around it. Concern for UTI, [place], or bowel obstruction EXAM: CT ABDOMEN AND PELVIS WITH CONTRAST TECHNIQUE: Multidetector CT imaging of the abdomen and pelvis was performed using the standard protocol following bolus administration of intravenous contrast. RADIATION DOSE REDUCTION: This exam was performed according to the departmental dose-optimization program which includes automated exposure control, adjustment of the mA and/or kV according to patient size and/or use of iterative reconstruction technique. CONTRAST:  182mL OMNIPAQUE IOHEXOL 300 MG/ML  SOLN COMPARISON:  CT AP, 01/05/2022 and 06/21/2019 FINDINGS: Lower chest: Cardiomegaly, with LEFT ventricular enlargement. Trace LEFT basilar and para-aortic atelectasis. Hepatobiliary: No focal liver abnormality is seen. Status post cholecystectomy. Mild biliary ductal distention, likely physiologic. No overt biliary dilatation. Pancreas: No pancreatic ductal dilatation or surrounding inflammatory changes. Spleen: Normal in size without focal abnormality. Adrenals/Urinary Tract: Adrenal glands are unremarkable. Small (sub-1 cm) hypodense renal cortical hypodensities, likely small cysts. Kidneys are normal, without renal calculi or hydronephrosis. Mild relative urinary bladder wall thickening for degree of distention. No pericystic stranding. Stomach/Bowel: Stomach is within normal limits. Appendix appears normal. Mild diverticulosis of distal transverse colon. Rectal distention  with stool. No perirectal stranding. No evidence of bowel wall thickening, distention, or inflammatory changes. Vascular/Lymphatic: Aortic atherosclerosis  without aneurysmal dilatation. No enlarged abdominal or pelvic lymph nodes. Reproductive: Large degenerated and calcified fibroid. Adnexa are unremarkable. Other: Laxity of the rectus sheath. Small fat-containing RIGHT inguinal hernia versus cord lipoma. No abdominopelvic ascites. Musculoskeletal: No acute or significant osseous findings. No follow-up is indicated for incidental findings, unless specifically mentioned. IMPRESSION: 1. No acute abdominopelvic process. 2. Rectal distention with stool, correlate for potential impaction. No overt findings to suggest stercoral colitis. 3. Suprapubic catheter within decompressed urinary bladder, with mild bladder wall thickening. Correlate with urinalysis. 4. Colonic diverticulosis and Aortic Atherosclerosis (ICD10-I70.0). Additional incidental, chronic and senescent findings as above. Electronically Signed   By: Michaelle Birks M.D.   On: 09/14/2022 13:40    Assessment/Plan Principal Problem:   UTI (urinary tract infection) due to urinary indwelling Foley catheter ([place]) -this is probably the source of her waxing and waning abdominal pain, could be caused by her malfunctioning suprapubic catheter. -Inpatient admission -Initiate empiric meropenem, await urine culture -Foley catheter has been replaced, ER provider is concerned that the size of her pannus may be compressing the tube Active Problems:   Obesity -BMI [id number], complicating all aspects of care   MS (multiple sclerosis) ([place])   Hypertensive heart disease with CHF (congestive heart failure) ([facility])   Paraplegia ([place])   Chronic pain syndrome   Dysphagia as late effect of stroke   DM2 (diabetes mellitus, type 2) ([place])  Diabetic diet when eating, patient's medications from home will be ordered as appropriate once reconciled by pharmacy staff.  DVT prophylaxis: Lovenox  Code Status: DNR, patient was noted to be on hospice as well.  Family Communication: None present  Disposition Plan: Presumably back to  nursing home on hospice when medically ready.  Consults called: None  Admission status: Inpatient  Time spent: 45 minutes  Maquita Sandoval Neva Seat MD [facility] Pager [phone]  If 7PM-7AM, please contact night-coverage [email or web address] Password [facility]  09/14/2022, 4:25 PM

## 2022-09-14 NOTE — ED Notes (Signed)
Patient has wet brief. Changed patient and placed new chux pads and brief on patient. Patient tolerated well.

## 2022-09-14 NOTE — Progress Notes (Signed)
[facility] ED WA21 - [facility]) - Hospitalized Hospice Patient Note:   Shannon Garrett is a hospice patient with an ACC terminal diagnosis of hypertensive heart disease with heart failure. Patient is a resident of [facility] whose staff initiated a call to EMS after patient complaints of abdominal pain and leaking suprapubic catheter. ACC was notified after patient was transported to the [facility] ED for evaluation. Patient is being admitted to the hospital with a UTI. Per Dr. Allie Dimmer Monguilod of [facility], this is a related admission.    Visited Shannon Garrett in [facility] ED, patient was alert, supine in bed with no visitors at side in NAD. Patient reported discomfort of a 6 on a scale of 10 and a thirst which was reported to the [facility] ED staff after the visit. Patient also stated she wanted to go back to her facility on hospice after having the catheter "fixed" and pain managed. Per EPIC notes, Dr. Sherry Ruffing changed the Suprapubic catheter in the ED with patient tolerating procedure well and a specimen for UA/Culture was taken. Plan is for patient to admit to receive IV antibiotics due to a history of UTI with resistant bacteria in the past.    Patient is appropriate for inpatient level of care due to the need for skilled assessment and care needed to treatment UTI with IV antibiotics.   V/S:  Temp 97.9, HR 79, RR 17, BP 117/82, 100% sats on Room Air. I&O: None recorded in ED. Abnormal lab work: [employer] 144  Diagnostics:  CT of Abd/Pelvis noted Rectal distention with stool, correlate for potential impaction. And Suprapubic catheter within decompressed urinary bladder, with mild bladder wall thickening - Correlate with urinalysis.  IVs/PRNs:  Percocet/Roxicent 5-325 mg tab adm 1430 on 09/14/22.  Merrem 1g [place] 100 ml IVPB adm IV Q8hr (first dose at 1233 on 09/14/22).  IVF of LR at 146ml/hr started at 1630 on 09/14/22    EPIC MD Problem list: Principal Problem:  UTI (urinary tract infection) due to urinary  indwelling Foley catheter ([place]) -this is probably the source of her waxing and waning abdominal pain, could be caused by her malfunctioning suprapubic catheter.-Inpatient admission. -Initiate empiric meropenem, await urine culture. -Foley catheter has been replaced, ER provider is concerned that the size of her pannus may be compressing the tube  Active Problems:   Obesity -BMI [id number], complicating all aspects of care   MS (multiple sclerosis) ([place])   Hypertensive heart disease with CHF (congestive heart failure) ([facility])   Paraplegia ([place])   Chronic pain syndrome   Dysphagia as late effect of stroke   DM2 (diabetes mellitus, type 2) ([place])  Diabetic diet when eating, patient's medications from home will be ordered as appropriate once reconciled by pharmacy staff. DVT prophylaxis: Lovenox      D/C planning- Ongoing assessment of needs - Plan is to discharge back to [facility]) with support of hospice once medically stable.  Goals of Care: Clear - Patient is a DNR and confirmed wishes to continue with the support of hospice upon discharge.  Communication with IDT- [facility] notified via Voicemail of admission, [place] team updated on patient status. Communication with [facility] - Reached out to patient daughter unsuccessfully via telephone leaving voicemail message.   Please use [facility] for all ACC patient's transportation needs.   Please call with any hospice related questions/concerns,   Gar Ponto, RN [place] (in [place]) [phone]

## 2022-09-14 NOTE — ED Triage Notes (Signed)
Patient brought in by EMS due to abdominal pain and issues with suprapubic catheter. Reports that patient was diagnosed with UTI a few weeks ago. Pt states that she was never treated for the UTI. EMS also reports leaking around the suprapubic cath and has urine coming from urethra as well. Pt reports abdominal pain as well.

## 2022-09-15 DIAGNOSIS — N390 Urinary tract infection, site not specified: Secondary | ICD-10-CM | POA: Diagnosis not present

## 2022-09-15 DIAGNOSIS — T83511A Infection and inflammatory reaction due to indwelling urethral catheter, initial encounter: Secondary | ICD-10-CM

## 2022-09-15 LAB — CBC
HCT: 38 % (ref 36.0–46.0)
Hemoglobin: 12.1 g/dL (ref 12.0–15.0)
MCH: 28.3 pg (ref 26.0–34.0)
MCHC: 31.8 g/dL (ref 30.0–36.0)
MCV: 89 fL (ref 80.0–100.0)
Platelets: 135 10*3/uL — ABNORMAL LOW (ref 150–400)
RBC: 4.27 MIL/uL (ref 3.87–5.11)
RDW: 16.2 % — ABNORMAL HIGH (ref 11.5–15.5)
WBC: 5.9 10*3/uL (ref 4.0–10.5)
nRBC: 0 % (ref 0.0–0.2)

## 2022-09-15 LAB — GLUCOSE, CAPILLARY
Glucose-Capillary: 81 mg/dL (ref 70–99)
Glucose-Capillary: 165 mg/dL — ABNORMAL HIGH (ref 70–99)

## 2022-09-15 LAB — HIV ANTIBODY (ROUTINE TESTING W REFLEX): HIV Screen 4th Generation wRfx: NONREACTIVE

## 2022-09-15 LAB — MRSA NEXT GEN BY PCR, NASAL: MRSA by PCR Next Gen: DETECTED — AB

## 2022-09-15 MED ORDER — STERILE WATER FOR IRRIGATION IR SOLN
30.0000 mL | Freq: Every day | Status: DC
  Administered 2022-09-15 – 2022-09-17 (×3): 30 mL

## 2022-09-15 MED ORDER — BUDESONIDE 0.25 MG/2ML IN SUSP
0.2500 mg | Freq: Two times a day (BID) | RESPIRATORY_TRACT | Status: DC
  Filled 2022-09-15 (×5): qty 2

## 2022-09-15 MED ORDER — MUPIROCIN 2 % EX OINT
1.0000 | TOPICAL_OINTMENT | Freq: Two times a day (BID) | CUTANEOUS | Status: DC
  Administered 2022-09-15 – 2022-09-18 (×7): 1 via NASAL
  Filled 2022-09-15 (×3): qty 22

## 2022-09-15 MED ORDER — MELATONIN 5 MG PO TABS
10.0000 mg | ORAL_TABLET | Freq: Every day | ORAL | Status: DC
  Administered 2022-09-15 – 2022-09-17 (×3): 10 mg via ORAL
  Filled 2022-09-15 (×3): qty 2

## 2022-09-15 MED ORDER — FLORANEX PO PACK
1.0000 g | PACK | Freq: Two times a day (BID) | ORAL | Status: DC
  Administered 2022-09-16 – 2022-09-18 (×5): 1 g via ORAL
  Filled 2022-09-15 (×5): qty 1

## 2022-09-15 MED ORDER — DEXTROSE 50 % IV SOLN: 12.5000 g | INTRAVENOUS | Status: AC

## 2022-09-15 MED ORDER — POLYETHYLENE GLYCOL 3350 17 G PO PACK
17.0000 g | PACK | Freq: Every day | ORAL | Status: DC
  Administered 2022-09-17 – 2022-09-18 (×2): 17 g via ORAL
  Filled 2022-09-15 (×3): qty 1

## 2022-09-15 MED ORDER — SORBITOL 70 % SOLN
300.0000 mL | TOPICAL_OIL | Freq: Once | ORAL | Status: AC
  Administered 2022-09-15: 300 mL via RECTAL
  Filled 2022-09-15: qty 90

## 2022-09-15 MED ORDER — METOPROLOL SUCCINATE ER 50 MG PO TB24
100.0000 mg | ORAL_TABLET | Freq: Every day | ORAL | Status: DC
  Administered 2022-09-16: 100 mg via ORAL
  Filled 2022-09-15: qty 2

## 2022-09-15 MED ORDER — ASPIRIN 325 MG PO TBEC
325.0000 mg | DELAYED_RELEASE_TABLET | Freq: Every day | ORAL | Status: DC
  Administered 2022-09-15 – 2022-09-18 (×4): 325 mg via ORAL
  Filled 2022-09-15 (×4): qty 1

## 2022-09-15 MED ORDER — CHLORHEXIDINE GLUCONATE CLOTH 2 % EX PADS
6.0000 | MEDICATED_PAD | Freq: Every day | CUTANEOUS | Status: DC
  Administered 2022-09-15 – 2022-09-18 (×4): 6 via TOPICAL

## 2022-09-15 MED ORDER — SIMETHICONE 80 MG PO CHEW: 125.0000 mg | CHEWABLE_TABLET | Freq: Four times a day (QID) | ORAL | Status: DC | PRN

## 2022-09-15 MED ORDER — SIMETHICONE 80 MG PO CHEW: 120.0000 mg | CHEWABLE_TABLET | Freq: Four times a day (QID) | ORAL | Status: DC | PRN

## 2022-09-15 MED ORDER — PROBIOTIC 250 MG PO CAPS: ORAL_CAPSULE | Freq: Two times a day (BID) | ORAL | Status: DC

## 2022-09-15 NOTE — Progress Notes (Addendum)
[facility] - [facility]) - Hospitalized Hospice Patient Note:   Shannon Garrett is a hospice patient with an ACC terminal diagnosis of hypertensive heart disease with heart failure. Patient is a resident of [facility] whose staff initiated a call to EMS after patient complaints of abdominal pain and leaking suprapubic catheter. ACC was notified after patient was transported to the [facility] ED for evaluation. Patient is being admitted to the hospital with a UTI. Per Dr. Allie Dimmer Monguilod of [facility], this is a related admission.    Visited Shannon Garrett at bedside.She is alert, sitting in bed with breakfast tray 100% eaten in front of her.  No visitors present. Patient reported discomfort of a 6 on a scale of 10  which was reported to her bedside RN, Caryl Pina.    Patient is appropriate for inpatient level of care due to the need for skilled assessment and care needed to treatment UTI with IV antibiotics.  VS: T 98.1, 133/86, HR 84, RR 18, Sats 100% on room air  I/O: 1265.08/1498  Abnormal labs:  RDW: 16.2 (H) Platelets: 135 (L)  Diagnostics: None new  IV/PRN: LR @ 150 cc/hr, Meropenem 1G IV Q 8 hours, Flagyl 500 mg IV x 1, oxycodone 5 mg IR po x 2  Problem List: Suprapubic catheter malfunction in the setting of MDRO organism - Suprapubic catheter exchanged 3/15 and cultures obtained subsequently, follow - On discharge can use Azo cranberry urinary tract - Will need scheduled changes every 1 to 2 months or as per hospice who follows her at [facility]   HFrEF EF 25% with pulmonary hypertension on hospice - Treat the treatable with losartan 25, Lasix 20 twice daily for comfort - Resume metoprolol XL 100 daily - Holding Aldactone 25 at this time - Resume aspirin 325, continue oxygen 2 to 4 L   Severe constipation - Milk of magnesium enema given with very good result - Stool ball came out   Advanced multiple sclerosis since 2022 Chronic bedbound state - Continue Symmetrel 100  a.m., baclofen 10 4 times daily, gabapentin 600 3 times daily   DM TY 2 - Continue SSI, gabapentin 600 3 times daily - CBGs ranging 70-100 patient eating some - Placed hypoglycemic protocol   Encounter for palliative care -Patient is on hospice[facility] has seen the patient - Patient will resume hospice services on return to [facility] contact: Updated daughter Cheslyn by phone who was appreciative.  IDT: Updated  Goals of care: Clear, return to her facility with the support of hospice when medically cleared.  Medication list and Transfer summary placed on patients hardback chart.  Should patient need ambulance transport at discharge, please call [facility] as we contract with them for our active hospice patients.  Please call with any hospice related questions or concerns.  Thank you, Margaretmary Eddy, BSN, RN [facility] Liaison [phone]

## 2022-09-15 NOTE — TOC Progression Note (Signed)
Transition of Care [facility]) - Progression Note    Patient Details  Name: Shannon Garrett MRN: [id number] Date of Birth: [birth year]  [facility]) CM/SW Contact  Henrietta Dine, RN Phone Number: 09/15/2022, 12:44 PM  Clinical Narrative:    [facility] consult for SNF; pt ? From [facility]; LVM for [place] at facility; attempted to verify residence and level of care; awaiting return call.        Expected Discharge Plan and Services                                               Social Determinants of Health (SDOH) Interventions SDOH Screenings   Food Insecurity: No Food Insecurity (09/14/2022)  Housing: Low Risk  (09/14/2022)  Transportation Needs: No Transportation Needs (09/14/2022)  Utilities: Not At Risk (09/14/2022)  Financial Resource Strain: Low Risk  (02/13/2018)  Physical Activity: Inactive (02/13/2018)  Social Connections: Moderately Isolated (02/13/2018)  Stress: No Stress Concern Present (02/13/2018)  Tobacco Use: Medium Risk (09/14/2022)    Readmission Risk Interventions     No data to display

## 2022-09-15 NOTE — Progress Notes (Signed)
PROGRESS NOTE   Shannon Garrett  [id number] DOB: Jan 23, 1961 DOA: 09/14/2022 PCP: Pcp, No  Brief Narrative:   62 year old black female HFrEF EF 25% + pulmonary HTN--on hospice currently secondary to this Advanced multiple sclerosis with polypharmacy in the past and neurogenic bladder-has suprapubic catheter Chronic pain syndrome with comorbid anxiety/depression  Recent hospitalization 12/31 through 07/06/2022 with acute exacerbation of chronic systolic heart failure with--was discharged back to her facility [place] after that hospitalization on 2 L of oxygen She is not a candidate for nocturnal CPAP because of weakness and manipulating devices per Dr. August Albino note 07/31/2022  Patient brought to [facility] ED by EMS from [place] with abdominal pain/suprapubic catheter malfunction-apparently had never been treated for UTI--suprapubic exchanged 3/8-dysuria present Suprapubic catheter exchanged CT abdomen pelvis = constipation stool ball  IV antibiotics given [merrem]  Hospital-Problem based course  Suprapubic catheter malfunction in the setting of MDRO organism - Suprapubic catheter exchanged 3/15 and cultures obtained subsequently, follow - On discharge can use Azo cranberry urinary tract - Will need scheduled changes every 1 to 2 months or as per hospice who follows her at [facility]  HFrEF EF 25% with pulmonary hypertension on hospice - Treat the treatable with losartan 25, Lasix 20 twice daily for comfort - Resume metoprolol XL 100 daily - Holding Aldactone 25 at this time - Resume aspirin 325, continue oxygen 2 to 4 L  Severe constipation - Milk of magnesium enema given with very good result - Stool ball came out  Advanced multiple sclerosis since 2022 Chronic bedbound state - Continue Symmetrel 100 a.m., baclofen 10 4 times daily, gabapentin 600 3 times daily  DM TY 2 - Continue SSI, gabapentin 600 3 times daily - CBGs ranging 70-100 patient eating some - Placed  hypoglycemic protocol  Encounter for palliative care -Patient is on hospice-[facility] has seen the patient - Patient will resume hospice services on return to [facility]   DVT prophylaxis: Lovenox Code Status: DNR Family Communication: None present Disposition:  Status is: Inpatient Remains inpatient appropriate because: needs Rx of MDRO organism     Subjective: Awake coherent no distress Tells me he feels a little bit better-relates history of being unable to get results No fever chills nausea vomiting  Objective: Vitals:   09/14/22 1700 09/14/22 1948 09/15/22 0037 09/15/22 0521  BP: 103/66 111/72 124/76 133/86  Pulse: 75 74 76 84  Resp:  18 18 18   Temp:  97.7 F (36.5 C) 98 F (36.7 C) 98.1 F (36.7 C)  TempSrc:  Oral Oral Oral  SpO2: 97% 100% 100% 100%  Weight:      Height:        Intake/Output Summary (Last 24 hours) at 09/15/2022 0843 Last data filed at 09/15/2022 0700 Gross per 24 hour  Intake 1265.21 ml  Output 1500 ml  Net -234.79 ml   Filed Weights   09/14/22 0928  Weight: 89.4 kg    Examination:  EOMI NCAT no focal deficit no icterus no pallor no wheeze no rales no rhonchi CTAB no added sound Abdomen distended, suprapubic catheter down and pannus No lower extremity edema Power is limited to lower extremities Patient has some contractures of upper fingers  Data Reviewed: personally reviewed   CBC    Component Value Date/Time   WBC 5.9 09/15/2022 0801   RBC 4.27 09/15/2022 0801   HGB 12.1 09/15/2022 0801   HGB 12.0 10/26/2019 1100   HCT 38.0 09/15/2022 0801   HCT 36.3 10/26/2019 1100  PLT 135 (L) 09/15/2022 0801   PLT 354 10/26/2019 1100   MCV 89.0 09/15/2022 0801   MCV 83 10/26/2019 1100   MCH 28.3 09/15/2022 0801   MCHC 31.8 09/15/2022 0801   RDW 16.2 (H) 09/15/2022 0801   RDW 16.8 (H) 10/26/2019 1100   LYMPHSABS 1.6 09/14/2022 1030   LYMPHSABS 0.9 08/26/2018 0959   MONOABS 0.3 09/14/2022 1030   EOSABS 0.5  09/14/2022 1030   EOSABS 0.9 (H) 08/26/2018 0959   BASOSABS 0.1 09/14/2022 1030   BASOSABS 0.1 08/26/2018 0959      Latest Ref Rng & Units 09/14/2022    6:00 PM 09/14/2022   10:30 AM 07/18/2022   11:52 AM  CMP  Glucose 70 - 99 mg/dL  82  101   BUN 6 - 20 mg/dL  19  10   Creatinine 0.44 - 1.00 mg/dL 0.80  0.84  0.74   Sodium 135 - 145 mmol/L  141  145   Potassium 3.5 - 5.1 mmol/L  4.0  4.4   Chloride 98 - 111 mmol/L  100  104   CO2 22 - 32 mmol/L  30  29   Calcium 8.9 - 10.3 mg/dL  9.2  9.0   Total Protein 6.5 - 8.1 g/dL  7.8    Total Bilirubin 0.3 - 1.2 mg/dL  0.5    Alkaline Phos 38 - 126 U/L  144    AST 15 - 41 U/L  21    ALT 0 - 44 U/L  18       Radiology Studies: CT ABDOMEN PELVIS W CONTRAST  Result Date: 09/14/2022 CLINICAL DATA:  Bowel obstruction suspected Not proving, dysuria, suprapubic catheter not working with urine leaking around it. Concern for UTI, [place], or bowel obstruction EXAM: CT ABDOMEN AND PELVIS WITH CONTRAST TECHNIQUE: Multidetector CT imaging of the abdomen and pelvis was performed using the standard protocol following bolus administration of intravenous contrast. RADIATION DOSE REDUCTION: This exam was performed according to the departmental dose-optimization program which includes automated exposure control, adjustment of the mA and/or kV according to patient size and/or use of iterative reconstruction technique. CONTRAST:  134mL OMNIPAQUE IOHEXOL 300 MG/ML  SOLN COMPARISON:  CT AP, 01/05/2022 and 06/21/2019 FINDINGS: Lower chest: Cardiomegaly, with LEFT ventricular enlargement. Trace LEFT basilar and para-aortic atelectasis. Hepatobiliary: No focal liver abnormality is seen. Status post cholecystectomy. Mild biliary ductal distention, likely physiologic. No overt biliary dilatation. Pancreas: No pancreatic ductal dilatation or surrounding inflammatory changes. Spleen: Normal in size without focal abnormality. Adrenals/Urinary Tract: Adrenal glands are  unremarkable. Small (sub-1 cm) hypodense renal cortical hypodensities, likely small cysts. Kidneys are normal, without renal calculi or hydronephrosis. Mild relative urinary bladder wall thickening for degree of distention. No pericystic stranding. Stomach/Bowel: Stomach is within normal limits. Appendix appears normal. Mild diverticulosis of distal transverse colon. Rectal distention with stool. No perirectal stranding. No evidence of bowel wall thickening, distention, or inflammatory changes. Vascular/Lymphatic: Aortic atherosclerosis without aneurysmal dilatation. No enlarged abdominal or pelvic lymph nodes. Reproductive: Large degenerated and calcified fibroid. Adnexa are unremarkable. Other: Laxity of the rectus sheath. Small fat-containing RIGHT inguinal hernia versus cord lipoma. No abdominopelvic ascites. Musculoskeletal: No acute or significant osseous findings. No follow-up is indicated for incidental findings, unless specifically mentioned. IMPRESSION: 1. No acute abdominopelvic process. 2. Rectal distention with stool, correlate for potential impaction. No overt findings to suggest stercoral colitis. 3. Suprapubic catheter within decompressed urinary bladder, with mild bladder wall thickening. Correlate with urinalysis. 4. Colonic diverticulosis and Aortic Atherosclerosis (  ICD10-I70.0). Additional incidental, chronic and senescent findings as above. Electronically Signed   By: Michaelle Birks M.D.   On: 09/14/2022 13:40     Scheduled Meds:  amantadine  100 mg Oral Q breakfast   baclofen  10 mg Oral TID   budesonide  0.25 mg Nebulization BID   buPROPion  150 mg Oral Q breakfast   Chlorhexidine Gluconate Cloth  6 each Topical Q0600   docusate sodium  100 mg Oral BID   DULoxetine  60 mg Oral Q breakfast   enoxaparin (LOVENOX) injection  40 mg Subcutaneous Q24H   fluticasone  1 spray Each Nare QHS   furosemide  20 mg Oral BID   gabapentin  600 mg Oral TID   latanoprost  1 drop Both Eyes QHS    linaclotide  290 mcg Oral QAC breakfast   losartan  25 mg Oral Daily   magnesium oxide  400 mg Oral Daily   mupirocin ointment  1 Application Nasal BID   nystatin  1 Application Topical BID   polyethylene glycol  17 g Oral BID   potassium chloride  20 mEq Oral Q breakfast   senna-docusate  2 tablet Oral BID   simvastatin  10 mg Oral QHS   sodium phosphate  1 enema Rectal Once   traMADol  100 mg Oral QHS   Continuous Infusions:  lactated ringers 150 mL/hr (09/15/22 0421)   meropenem (MERREM) IV 1 g (09/15/22 0434)     LOS: 1 day   Time spent: [place], MD [facility] To contact the attending provider between 7A-7P or the covering provider during after hours 7P-7A, please log into the web site [email or web address] and access using universal [place] password for that web site. If you do not have the password, please call the hospital operator.  09/15/2022, 8:43 AM

## 2022-09-16 DIAGNOSIS — T83511A Infection and inflammatory reaction due to indwelling urethral catheter, initial encounter: Secondary | ICD-10-CM | POA: Diagnosis not present

## 2022-09-16 DIAGNOSIS — N390 Urinary tract infection, site not specified: Secondary | ICD-10-CM | POA: Diagnosis not present

## 2022-09-16 LAB — BASIC METABOLIC PANEL
Anion gap: 7 (ref 5–15)
BUN: 15 mg/dL (ref 6–20)
CO2: 26 mmol/L (ref 22–32)
Calcium: 8.5 mg/dL — ABNORMAL LOW (ref 8.9–10.3)
Chloride: 106 mmol/L (ref 98–111)
Creatinine, Ser: 0.72 mg/dL (ref 0.44–1.00)
GFR, Estimated: >60 mL/min (ref >60)
Glucose, Bld: 90 mg/dL (ref 70–99)
Potassium: 3.7 mmol/L (ref 3.5–5.1)
Sodium: 139 mmol/L (ref 135–145)

## 2022-09-16 LAB — LACTIC ACID, PLASMA
Lactic Acid, Venous: 1.1 mmol/L (ref 0.5–1.9)
Lactic Acid, Venous: 1.3 mmol/L (ref 0.5–1.9)

## 2022-09-16 LAB — GLUCOSE, CAPILLARY
Glucose-Capillary: 110 mg/dL — ABNORMAL HIGH (ref 70–99)
Glucose-Capillary: 111 mg/dL — ABNORMAL HIGH (ref 70–99)
Glucose-Capillary: 112 mg/dL — ABNORMAL HIGH (ref 70–99)
Glucose-Capillary: 154 mg/dL — ABNORMAL HIGH (ref 70–99)
Glucose-Capillary: 87 mg/dL (ref 70–99)
Glucose-Capillary: 98 mg/dL (ref 70–99)

## 2022-09-16 MED ORDER — SODIUM CHLORIDE 0.9 % IV SOLN: INTRAVENOUS | Status: DC

## 2022-09-16 MED ORDER — SODIUM CHLORIDE 0.9 % IV BOLUS
500.0000 mL | Freq: Once | INTRAVENOUS | Status: AC
  Administered 2022-09-16: 500 mL via INTRAVENOUS

## 2022-09-16 MED ORDER — METOPROLOL SUCCINATE ER 25 MG PO TB24
12.5000 mg | ORAL_TABLET | Freq: Every day | ORAL | Status: DC
  Administered 2022-09-17 – 2022-09-18 (×2): 12.5 mg via ORAL
  Filled 2022-09-16 (×2): qty 1

## 2022-09-16 NOTE — Consult Note (Signed)
[facility] Nurse Consult Note: Reason for Consult: "healed pressure injury" and moisture Discussed with bedside nurse  Patient from SNF with advanced MS, bedbound, Hospice patient Wound type: no open wounds Pressure Injury POA: NA No moisture issues, reddened buttocks Dressing procedure/placement/frequency: Prophylactic silicone foam dressing to healed PI, skin will be at risk for repeat break down  [place], [place], [place]

## 2022-09-16 NOTE — Progress Notes (Signed)
  [id number] - Manufacturing engineer [facility]) - Hospitalized Hospice Patient Note:   Shannon Garrett is a hospice patient with an [facility] terminal diagnosis of hypertensive heart disease with heart failure. Patient is a resident of [facility] whose staff initiated a call to EMS after patient complaints of abdominal pain and leaking suprapubic catheter. [facility] was notified after patient was transported to the [facility] ED for evaluation. Patient is being admitted to the hospital with a UTI. Per Dr. Allie Dimmer Mong[place]od of [facility], this is a related admission.   Visited with Shannon Garrett at bedside. She was drowsy but awakened easily to verbal stimuli. She states she is feeling much better today and feels like her UTI is gone. She denies any pain at this time. She shares she ate 100% of her breakfast of bagel and cereal. She is anxious to leave the hospital and return to her facility.  Patient is appropriate for inpatient level of care due to the need for skilled assessment and care and treatment of UTI with IV antibiotics.   VS: 98.5 oral, 106/70, 94, 18, 100% on room air  I/O: 1188/1900  Abnormal Labs: Calcium: 8.5 (L)  Diagnostics: None new  IV/PRN: Meropenem 1 G IV TID, Tylenol 650 mg X 1  Problem List: Hypotension, not distributive shock - Had several loose stools likely precipitating this, lactic acid is 1.1 therefore this is not infectious - Discontinue purgatives - Status post 1 L bolus fluid responsive more awake alert continues saline 50 cc/h   Suprapubic catheter malfunction in the setting of MDRO organism - Suprapubic catheter exchanged 3/15 and cultures obtained subsequently, follow - Remains on meropenem for broad coverage-urine culture growing Proteus Morganella and Klebsiella and E. coli-patient has a very oddlty placed suprapubic catheter--follows in [place] with her urologist there, does probably need consideration for replacement of this in a different site - On discharge can use Azo  cranberry urinary tract - Will need scheduled changes every 2 weeks as per prior protocol -Watch platelet count in a.m. 3/18-has dropped since admission?  Dilutional   HFrEF EF 25% with pulmonary hypertension on hospice - now on Metoprolol XL 12.5--stop lasix, losartan - Holding Aldactone 25 at this time - Resume aspirin 325, continue oxygen 2 to 4 L   Severe constipation - Milk of magnesium enema given with very good result - Stool ball came out -Stop all laxatives as patient became hypotensive in the setting of multiple antihypertensives and had excessive stool   Advanced multiple sclerosis since 2022 Chronic bedbound state - Continue Symmetrel 100 a.m., baclofen 10 4 times daily, gabapentin 600 3 times daily   DM TY 2 - Continue SSI, gabapentin 600 3 times daily - CBGs ranging 87-150 patient eating some - Placed hypoglycemic protocol   Encounter for palliative care - Patient is on hospice-[facility] liaison has seen the patient - Patient will resume hospice services on return to [place]: Attempted to call daughter Shannon Garrett X 3 but call could not be completed Youth worker issue)  IDT: Updated  Goals of care: Clear, return to her facility with the support of hospice when medically cleared.    Should patient need ambulance transport at discharge, please call [facility] as we contract with them for our active hospice patients.   Please call with any hospice related questions or concerns.   Thank you, Shannon Garrett, BSN, RN [facility] Liaison [phone]

## 2022-09-16 NOTE — Progress Notes (Signed)
PROGRESS NOTE   Shannon Garrett  [id number] DOB: 10/17/1960 DOA: 09/14/2022 PCP: Pcp, No  Brief Narrative:   62 year old black female HFrEF EF 25% + pulmonary HTN--on hospice currently secondary to this Advanced multiple sclerosis with polypharmacy in the past and neurogenic bladder-has suprapubic catheter Chronic pain syndrome with comorbid anxiety/depression  Recent hospitalization 12/31 through 07/06/2022 with acute exacerbation of chronic systolic heart failure with--was discharged back to her facility [place] after that hospitalization on 2 L of oxygen She is not a candidate for nocturnal CPAP because of weakness and manipulating devices per Dr. August Albino note 07/31/2022  Patient brought to [facility] ED by EMS from [place] with abdominal pain/suprapubic catheter malfunction-apparently had never been treated for UTI--suprapubic exchanged 3/8-dysuria present Suprapubic catheter exchanged CT abdomen pelvis = constipation stool ball  IV antibiotics given [merrem]  3/17-hypotensive?  2/2 infection versus profuse diarrhea  Hospital-Problem based course  Hypotension, not distributive shock - Had several loose stools likely precipitating this, lactic acid is 1.1 therefore this is not infectious - Discontinue purgatives - Status post 1 L bolus fluid responsive more awake alert continues saline 50 cc/h  Suprapubic catheter malfunction in the setting of MDRO organism - Suprapubic catheter exchanged 3/15 and cultures obtained subsequently, follow - Remains on meropenem for broad coverage-urine culture growing Proteus Morganella and Klebsiella and E. coli-patient has a very oddlty placed suprapubic catheter--follows in [place] with her urologist there, does probably need consideration for replacement of this in a different site - On discharge can use Azo cranberry urinary tract - Will need scheduled changes every 2 weeks as per prior protocol -Watch platelet count in a.m. 3/18-has  dropped since admission?  Dilutional  HFrEF EF 25% with pulmonary hypertension on hospice - now on Metoprolol XL 12.5--stop lasix, losartan - Holding Aldactone 25 at this time - Resume aspirin 325, continue oxygen 2 to 4 L  Severe constipation - Milk of magnesium enema given with very good result - Stool ball came out -Stop all laxatives as patient became hypotensive in the setting of multiple antihypertensives and had excessive stool  Advanced multiple sclerosis since 2022 Chronic bedbound state - Continue Symmetrel 100 a.m., baclofen 10 4 times daily, gabapentin 600 3 times daily  DM TY 2 - Continue SSI, gabapentin 600 3 times daily - CBGs ranging 87-150 patient eating some - Placed hypoglycemic protocol  Encounter for palliative care - Patient is on hospice-[facility] lia[facility] has seen the patient - Patient will resume hospice services on return to [facility]   DVT prophylaxis: Lovenox Code Status: DNR Family Communication: None Disposition:  Status is: Inpatient Remains inpatient appropriate because: needs Rx of MDRO organism  Subjective:  Seen earlier this morning but then patient became hypotensive around 1 PM Responsive to fluids more awake alert after bolus of fluid Nursing reports had several stools "What should I do about my blood pressure meds"-explained to patient that as she is on hospice for her heart failure issues, we will discuss this in the grand scheme of things in the next several days with her hospice liaison  Objective: Vitals:   09/16/22 1332 09/16/22 1338 09/16/22 1430 09/16/22 1504  BP: (!) 88/55 90/60 (!) 100/47 (!) 109/59  Pulse: 79   78  Resp: 16   20  Temp: 98.2 F (36.8 C)   97.7 F (36.5 C)  TempSrc: Oral   Oral  SpO2: 100%   100%  Weight:      Height:        Intake/Output  Summary (Last 24 hours) at 09/16/2022 1647 Last data filed at 09/16/2022 1624 Gross per 24 hour  Intake 120 ml  Output 1800 ml  Net -1680 ml    Filed  Weights   09/14/22 0928  Weight: 89.4 kg    Examination:  Awake.  No distress No icterus no pallor Chest is clear anterolaterally Abdomen soft no rebound no guarding suprapubic catheter in place below the pannus She has bilateral contractures of her upper extremities She has some weakness bilaterally in lower extremities  Data Reviewed: personally reviewed   CBC    Component Value Date/Time   WBC 5.9 09/15/2022 0801   RBC 4.27 09/15/2022 0801   HGB 12.1 09/15/2022 0801   HGB 12.0 10/26/2019 1100   HCT 38.0 09/15/2022 0801   HCT 36.3 10/26/2019 1100   PLT 135 (L) 09/15/2022 0801   PLT 354 10/26/2019 1100   MCV 89.0 09/15/2022 0801   MCV 83 10/26/2019 1100   MCH 28.3 09/15/2022 0801   MCHC 31.8 09/15/2022 0801   RDW 16.2 (H) 09/15/2022 0801   RDW 16.8 (H) 10/26/2019 1100   LYMPHSABS 1.6 09/14/2022 1030   LYMPHSABS 0.9 08/26/2018 0959   MONOABS 0.3 09/14/2022 1030   EOSABS 0.5 09/14/2022 1030   EOSABS 0.9 (H) 08/26/2018 0959   BASOSABS 0.1 09/14/2022 1030   BASOSABS 0.1 08/26/2018 0959      Latest Ref Rng & Units 09/16/2022    8:46 AM 09/14/2022    6:00 PM 09/14/2022   10:30 AM  CMP  Glucose 70 - 99 mg/dL 90   82   BUN 6 - 20 mg/dL 15   19   Creatinine 0.44 - 1.00 mg/dL 0.72  0.80  0.84   Sodium 135 - 145 mmol/L 139   141   Potassium 3.5 - 5.1 mmol/L 3.7   4.0   Chloride 98 - 111 mmol/L 106   100   CO2 22 - 32 mmol/L 26   30   Calcium 8.9 - 10.3 mg/dL 8.5   9.2   Total Protein 6.5 - 8.1 g/dL   7.8   Total Bilirubin 0.3 - 1.2 mg/dL   0.5   Alkaline Phos 38 - 126 U/L   144   AST 15 - 41 U/L   21   ALT 0 - 44 U/L   18      Radiology Studies: No results found.   Scheduled Meds:  amantadine  100 mg Oral Q breakfast   aspirin EC  325 mg Oral Daily   baclofen  10 mg Oral TID   budesonide  0.25 mg Nebulization BID   buPROPion  150 mg Oral Q breakfast   Chlorhexidine Gluconate Cloth  6 each Topical Q0600   docusate sodium  100 mg Oral BID   DULoxetine  60  mg Oral Q breakfast   enoxaparin (LOVENOX) injection  40 mg Subcutaneous Q24H   fluticasone  1 spray Each Nare QHS   gabapentin  600 mg Oral TID   lactobacillus  1 g Oral BID   latanoprost  1 drop Both Eyes QHS   linaclotide  290 mcg Oral QAC breakfast   magnesium oxide  400 mg Oral Daily   melatonin  10 mg Oral QHS   [START ON 09/17/2022] metoprolol succinate  12.5 mg Oral Q breakfast   mupirocin ointment  1 Application Nasal BID   nystatin  1 Application Topical BID   polyethylene glycol  17 g Oral Daily   potassium  chloride  20 mEq Oral Q breakfast   senna-docusate  2 tablet Oral BID   simvastatin  10 mg Oral QHS   sodium phosphate  1 enema Rectal Once   sterile water for irrigation  30 mL Irrigation QHS   traMADol  100 mg Oral QHS   Continuous Infusions:  sodium chloride     meropenem (MERREM) IV 1 g (09/16/22 1153)     LOS: 2 days   Time spent: 67  Nita Sells, MD [facility] To contact the attending provider between 7A-7P or the covering provider during after hours 7P-7A, please log into the web site [email or web address] and access using universal [place] password for that web site. If you do not have the password, please call the hospital operator.  09/16/2022, 4:47 PM

## 2022-09-16 NOTE — Progress Notes (Signed)
BP 100/47 (MAP 62) after first 500cc bolus. A second 500cc bolus of NS started per verbal order from MD [facility].

## 2022-09-17 DIAGNOSIS — T83511A Infection and inflammatory reaction due to indwelling urethral catheter, initial encounter: Secondary | ICD-10-CM | POA: Diagnosis not present

## 2022-09-17 DIAGNOSIS — N390 Urinary tract infection, site not specified: Secondary | ICD-10-CM | POA: Diagnosis not present

## 2022-09-17 LAB — CBC
HCT: 35.4 % — ABNORMAL LOW (ref 36.0–46.0)
Hemoglobin: 11.3 g/dL — ABNORMAL LOW (ref 12.0–15.0)
MCH: 28.4 pg (ref 26.0–34.0)
MCHC: 31.9 g/dL (ref 30.0–36.0)
MCV: 88.9 fL (ref 80.0–100.0)
Platelets: 189 10*3/uL (ref 150–400)
RBC: 3.98 MIL/uL (ref 3.87–5.11)
RDW: 15.9 % — ABNORMAL HIGH (ref 11.5–15.5)
WBC: 6.3 10*3/uL (ref 4.0–10.5)
nRBC: 0 % (ref 0.0–0.2)

## 2022-09-17 LAB — COMPREHENSIVE METABOLIC PANEL
ALT: 18 U/L (ref 0–44)
AST: 19 U/L (ref 15–41)
Albumin: 3.4 g/dL — ABNORMAL LOW (ref 3.5–5.0)
Alkaline Phosphatase: 116 U/L (ref 38–126)
Anion gap: 6 (ref 5–15)
BUN: 14 mg/dL (ref 6–20)
CO2: 28 mmol/L (ref 22–32)
Calcium: 8.7 mg/dL — ABNORMAL LOW (ref 8.9–10.3)
Chloride: 107 mmol/L (ref 98–111)
Creatinine, Ser: 0.65 mg/dL (ref 0.44–1.00)
GFR, Estimated: >60 mL/min (ref >60)
Glucose, Bld: 74 mg/dL (ref 70–99)
Potassium: 4.1 mmol/L (ref 3.5–5.1)
Sodium: 141 mmol/L (ref 135–145)
Total Bilirubin: 0.5 mg/dL (ref 0.3–1.2)
Total Protein: 6.7 g/dL (ref 6.5–8.1)

## 2022-09-17 LAB — GLUCOSE, CAPILLARY
Glucose-Capillary: 117 mg/dL — ABNORMAL HIGH (ref 70–99)
Glucose-Capillary: 157 mg/dL — ABNORMAL HIGH (ref 70–99)
Glucose-Capillary: 159 mg/dL — ABNORMAL HIGH (ref 70–99)
Glucose-Capillary: 168 mg/dL — ABNORMAL HIGH (ref 70–99)
Glucose-Capillary: 72 mg/dL (ref 70–99)
Glucose-Capillary: 76 mg/dL (ref 70–99)
Glucose-Capillary: 90 mg/dL (ref 70–99)

## 2022-09-17 NOTE — Progress Notes (Signed)
PROGRESS NOTE   Shannon Garrett  [id number] DOB: 1961-06-23 DOA: 09/14/2022 PCP: Pcp, No  Brief Narrative:   62 year old black female HFrEF EF 25% + pulmonary HTN--on hospice currently secondary to this Advanced multiple sclerosis with polypharmacy in the past and neurogenic bladder-has suprapubic catheter Chronic pain syndrome with comorbid anxiety/depression  Recent hospitalization 12/31 through 07/06/2022 with acute exacerbation of chronic systolic heart failure with--was discharged back to her facility [place] after that hospitalization on 2 L of oxygen She is not a candidate for nocturnal CPAP because of weakness and manipulating devices per Dr. August Albino note 07/31/2022  Patient brought to [facility] ED by EMS from [place] with abdominal pain/suprapubic catheter malfunction-apparently had never been treated for UTI--suprapubic exchanged 3/8-dysuria present Suprapubic catheter exchanged CT abdomen pelvis = constipation stool ball  IV antibiotics given [merrem]  3/17-hypotensive?  2/2 infection versus profuse diarrhea  Hospital-Problem based course  Hypotension, not distributive shock - Only 1 stool today not hypotensive any longer - Discontinue purgatives - DC IVF 3/18  Suprapubic catheter malfunction in the setting of MDRO organism - Suprapubic catheter exchanged 3/15 and cultures obtained subsequently, follow -? Colonized given multiple organisms-did receive 4 days of meropenem-discontinue antibiotics and observe - On discharge resume Azo cranberry urinary tract - Will need scheduled changes every 2 weeks as per prior protocol - Platelet counts have rebounded  HFrEF EF 25% with pulmonary hypertension on hospice - now on Metoprolol XL 12.5--stop lasix, losartan - Holding Aldactone 25 at this time - Resume aspirin 325, continue oxygen 2 to 4 L  Severe constipation - Milk of magnesium enema given earlier in stay-Stool ball came out -Stopped Colace MOM senna 2/2  hypotension and profuse diarrhea causing hypotension-can continue Linzess and if needed MiraLAX   Advanced multiple sclerosis since 2022 Chronic bedbound state - Continue Symmetrel 100 a.m., baclofen 10 4 times daily, gabapentin 600 3 times daily  DM TY 2 - Continue SSI, gabapentin 600 3 times daily - CBGs ranging 78-160 patient eating some - Placed hypoglycemic protocol  Encounter for palliative care - Patient is on hospice[facility] lia[facility] has seen the patient - Patient will resume hospice services on return to [facility]   DVT prophylaxis: Lovenox Code Status: DNR Family Communication: None Disposition:  Status is: Inpatient Remains inpatient appropriate because:   Seems to be stabilizing-likely can discharge to facility 3/19  Subjective:  Looks better-more coherent responsive No fever no chills No chest pain  Objective: Vitals:   09/16/22 1504 09/16/22 1930 09/17/22 0418 09/17/22 1329  BP: (!) 109/59 (!) 101/55 110/84 (!) 122/59  Pulse: 78 71 69 75  Resp: 20 18 18 12   Temp: 97.7 F (36.5 C) 97.6 F (36.4 C) 97.8 F (36.6 C) 98.1 F (36.7 C)  TempSrc: Oral Oral Oral Oral  SpO2: 100% 100% 100% 100%  Weight:      Height:        Intake/Output Summary (Last 24 hours) at 09/17/2022 1521 Last data filed at 09/17/2022 1300 Gross per 24 hour  Intake 1923.59 ml  Output 950 ml  Net 973.59 ml    Filed Weights   09/14/22 0928  Weight: 89.4 kg    Examination:  Awake no distress no icterus No icterus no pallor Patient has clawing of the left upper arm hand Able to move right upper extremity fairly well Power limited in lower extremities 3/5 Abdomen is soft no rebound suprapubic catheter in place in pannus of abdomen No lower extremity edema Skin not examined  Data  Reviewed: personally reviewed   CBC    Component Value Date/Time   WBC 6.3 09/17/2022 0858   RBC 3.98 09/17/2022 0858   HGB 11.3 (L) 09/17/2022 0858   HGB 12.0 10/26/2019 1100   HCT  35.4 (L) 09/17/2022 0858   HCT 36.3 10/26/2019 1100   PLT 189 09/17/2022 0858   PLT 354 10/26/2019 1100   MCV 88.9 09/17/2022 0858   MCV 83 10/26/2019 1100   MCH 28.4 09/17/2022 0858   MCHC 31.9 09/17/2022 0858   RDW 15.9 (H) 09/17/2022 0858   RDW 16.8 (H) 10/26/2019 1100   LYMPHSABS 1.6 09/14/2022 1030   LYMPHSABS 0.9 08/26/2018 0959   MONOABS 0.3 09/14/2022 1030   EOSABS 0.5 09/14/2022 1030   EOSABS 0.9 (H) 08/26/2018 0959   BASOSABS 0.1 09/14/2022 1030   BASOSABS 0.1 08/26/2018 0959      Latest Ref Rng & Units 09/17/2022    8:58 AM 09/16/2022    8:46 AM 09/14/2022    6:00 PM  CMP  Glucose 70 - 99 mg/dL 74  90    BUN 6 - 20 mg/dL 14  15    Creatinine 0.44 - 1.00 mg/dL 0.65  0.72  0.80   Sodium 135 - 145 mmol/L 141  139    Potassium 3.5 - 5.1 mmol/L 4.1  3.7    Chloride 98 - 111 mmol/L 107  106    CO2 22 - 32 mmol/L 28  26    Calcium 8.9 - 10.3 mg/dL 8.7  8.5    Total Protein 6.5 - 8.1 g/dL 6.7     Total Bilirubin 0.3 - 1.2 mg/dL 0.5     Alkaline Phos 38 - 126 U/L 116     AST 15 - 41 U/L 19     ALT 0 - 44 U/L 18        Radiology Studies: No results found.   Scheduled Meds:  amantadine  100 mg Oral Q breakfast   aspirin EC  325 mg Oral Daily   baclofen  10 mg Oral TID   budesonide  0.25 mg Nebulization BID   buPROPion  150 mg Oral Q breakfast   Chlorhexidine Gluconate Cloth  6 each Topical Q0600   docusate sodium  100 mg Oral BID   DULoxetine  60 mg Oral Q breakfast   enoxaparin (LOVENOX) injection  40 mg Subcutaneous Q24H   fluticasone  1 spray Each Nare QHS   gabapentin  600 mg Oral TID   lactobacillus  1 g Oral BID   latanoprost  1 drop Both Eyes QHS   linaclotide  290 mcg Oral QAC breakfast   magnesium oxide  400 mg Oral Daily   melatonin  10 mg Oral QHS   metoprolol succinate  12.5 mg Oral Q breakfast   mupirocin ointment  1 Application Nasal BID   nystatin  1 Application Topical BID   polyethylene glycol  17 g Oral Daily   potassium chloride  20 mEq  Oral Q breakfast   senna-docusate  2 tablet Oral BID   simvastatin  10 mg Oral QHS   sodium phosphate  1 enema Rectal Once   sterile water for irrigation  30 mL Irrigation QHS   traMADol  100 mg Oral QHS   Continuous Infusions:  sodium chloride 50 mL/hr at 09/17/22 0801     LOS: 3 days   Time spent: [place], MD [facility] To contact the attending provider between 7A-7P or the covering  provider during after hours 7P-7A, please log into the web site [email or web address] and access using universal [place] password for that web site. If you do not have the password, please call the hospital operator.  09/17/2022, 3:21 PM

## 2022-09-17 NOTE — TOC Progression Note (Signed)
Transition of Care [facility]) - Progression Note    Patient Details  Name: Shannon Garrett MRN: [id number] Date of Birth: 06-01-1961  Transition of Care [facility]) CM/SW [place], [place] Phone Number: 09/17/2022, 3:48 PM  Clinical Narrative:     CSW confirmed with [facility] that pt is LTC there.   CSW met with pt and confirmed she is from [facility] and agreeable to return at [place]. She is active with [facility]. Fl2 completed and sent in hub.    Expected Discharge Plan: [place] Barriers to Discharge: Continued Medical Work up  Expected Discharge Plan and [place] arrangements for the past 2 months: [place]                                       Social Determinants of Health (SDOH) Interventions SDOH Screenings   Food Insecurity: No Food Insecurity (09/14/2022)  Housing: Low Risk  (09/14/2022)  Transportation Needs: No Transportation Needs (09/14/2022)  Utilities: Not At Risk (09/14/2022)  Financial Resource Strain: Low Risk  (02/13/2018)  Physical Activity: Inactive (02/13/2018)  Social Connections: Moderately Isolated (02/13/2018)  Stress: No Stress Concern Present (02/13/2018)  Tobacco Use: Medium Risk (09/14/2022)    Readmission Risk Interventions     No data to display

## 2022-09-17 NOTE — Progress Notes (Signed)
[place] [address] [facility]) Hospitalized Hospice patient visit  Shannon Garrett is a hospice patient with an ACC terminal diagnosis of hypertensive heart disease with heart failure. Patient is a resident of [facility] whose staff initiated a call to EMS after patient complaints of abdominal pain and leaking suprapubic catheter. ACC was notified after patient was transported to the [facility] ED for evaluation. Patient is being admitted to the hospital with a UTI. Per Dr. Allie Dimmer [place] of [facility], this is a related admission.  Patient reports to feeling better today overall. Exchanged report with TOC and current plan is for discharge tomorrow back to her facility and patient is happy with this plan. She will get one last day of IV AB today prior to discharge.   Patient remains inpatient appropriate due to need for IVAB.  Vital Signs- 98.1/79/17     122/59     100% room air I&O- 2252/1100 Abnormal Labs- Ca+ 8.7, Albumin 3.4, Hgb 11.3, Hct 35.4  Diagnostics-  None New  IV/PRN Meds- NS @ 50cc/H IV, Meropenem 1 gram q8H IV  Problem List- Hypotension, not distributive shock - Only 1 stool today not hypotensive any longer - Discontinue purgatives - DC IVF 3/18   Suprapubic catheter malfunction in the setting of MDRO organism - Suprapubic catheter exchanged 3/15 and cultures obtained subsequently, follow -? Colonized given multiple organisms-did receive 4 days of meropenem-discontinue antibiotics and observe - On discharge resume Azo cranberry urinary tract - Will need scheduled changes every 2 weeks as per prior protocol - Platelet counts have rebounded   HFrEF EF 25% with pulmonary hypertension on hospice - now on Metoprolol XL 12.5--stop lasix, losartan - Holding Aldactone 25 at this time - Resume aspirin 325, continue oxygen 2 to 4 L  Discharge Planning- Plan for discharge back to facility tomorrow.  Family Contact- Unable to reach daughter  IDT: updated Goals of Care:  Clear, patient wants treatment for UTI and then to return to facility  Thank you for the opportunity to participate in this patient's care, please don't hesitate to call for any hospice related questions or concerns.  Please use [facility] at discharge as we contract with them for our current hospice patients.  Jhonnie Garner BSN, [employer] [phone]

## 2022-09-17 NOTE — NC FL2 (Signed)
[place] MEDICAID FL2 LEVEL OF CARE FORM     IDENTIFICATION  Patient Name: Shannon Garrett Birthdate: Nov 03, 1960 Sex: female Admission Date (Current Location): 09/14/2022  [facility] and [place] Number:  Herbalist and Address:  [facility],  [place], [place]      Provider Number: [id number]  Attending Physician Name and Address:  Nita Sells, MD  Relative Name and Phone Number:  [place], [place] (Sister) [phone] [facility] Phone)    Current Level of Care: Hospital Recommended Level of Care: [place] Prior Approval Number:    Date Approved/Denied:   PASRR Number:    Discharge Plan: SNF    Current Diagnoses: Patient Active Problem List   Diagnosis Date Noted   UTI (urinary tract infection) due to urinary indwelling Foley catheter ([place]) 09/14/2022   Acute on chronic combined systolic and diastolic CHF (congestive heart failure) ([place]) 07/01/2022   Elevated troponin 07/01/2022   Anemia of chronic disease 07/01/2022   DM2 (diabetes mellitus, type 2) ([place]) 07/01/2022   SVT (supraventricular tachycardia) 05/19/2020   Cholangitis from CBD obstruction s/p ERCP 02/15/2020 02/16/2020   Choledocholithiasis s/p ERCP 02/15/2020 02/16/2020   Cholecystitis 02/12/2020   Abnormal nuclear stress test    Preoperative cardiovascular examination    Acute calculous cholecystitis 07/19/2019   Insulin dependent diabetes mellitus type IA ([place]) 07/19/2019   History of Clostridioides difficile colitis 07/19/2019   Multiple drug resistant organism (MDRO) Klebsiella pneumonia UTI culture positive 07/19/2019   Enteritis due to Clostridium difficile 06/24/2019   COVID-19 virus infection 06/24/2019   Palliative care by specialist    DNR (do not resuscitate)    Sepsis ([place]) 05/10/2019   History of COVID-19 Nov2020 05/10/2019   Dysesthesia 08/26/2018   Dyslipidemia 06/13/2017   Elevated alkaline phosphatase level 06/13/2017   Acute  frontal sinusitis 05/24/2017   Influenza 04/19/2017   Glaucoma 04/06/2017   Allergic rhinitis 03/07/2017   Vaginal candidiasis 03/01/2017   UTI (urinary tract infection) 01/02/2017   High risk medication use 01/25/2016   Neuropathy 11/13/2015   GERD (gastroesophageal reflux disease) 11/13/2015   Dysphagia as late effect of stroke 11/08/2015   Adrenal insufficiency (Addison's disease) ([place]) 08/12/2015   Acute hypoxic respiratory failure (HCC)    Vitamin D deficiency 06/18/2015   Hypokalemia 06/03/2015   Chronic anemia 05/30/2015   Chronic pain syndrome 03/03/2015   Essential hypertension    SIRS (systemic inflammatory response syndrome) ([place]) 01/11/2015   Dilated cardiomyopathy ([place])    Chronic constipation 11/26/2014   Depression 11/26/2014   Adult failure to thrive 07/31/2013   Insomnia 06/03/2013   Neurogenic bladder 06/03/2013   Paraplegia ([place]) 09/05/2012   Chronic diastolic CHF (congestive heart failure) ([place]) 05/22/2012   Obesity 11/30/2008   MS (multiple sclerosis) ([place]) 11/30/2008   Hypertensive heart disease with CHF (congestive heart failure) ([place]) 11/30/2008    Orientation RESPIRATION BLADDER Height & Weight     Self, Situation, Time, Place  Normal Continent Weight: 197 lb (89.4 kg) Height:  5\' 5"  (165.1 cm)  BEHAVIORAL SYMPTOMS/MOOD NEUROLOGICAL BOWEL NUTRITION STATUS      Incontinent Diet (see d/c summary)  AMBULATORY STATUS COMMUNICATION OF NEEDS Skin   Extensive Assist Verbally PU Stage and Appropriate Care (Ischial tuberosity, left, medial)                       Personal Care Assistance Level of Assistance  Feeding, Bathing, Dressing Bathing Assistance: Limited assistance Feeding assistance: Independent Dressing Assistance: Limited assistance  Functional Limitations Info  Sight, Hearing, Speech Sight Info: Adequate Hearing Info: Adequate Speech Info: Adequate    SPECIAL CARE FACTORS FREQUENCY  PT (By licensed PT), OT (By licensed OT)      PT Frequency: 5x/week OT Frequency: 5x/week            Contractures Contractures Info: Not present    Additional Factors Info  Code Status, Allergies Code Status Info: DNR Allergies Info: Sulfonamide Derivatives, penicillins           Current Medications (09/17/2022):  This is the current hospital active medication list Current Facility-Administered Medications  Medication Dose Route Frequency Provider Last Rate Last Admin   acetaminophen (TYLENOL) tablet 650 mg  650 mg Oral Q8H PRN Hollice Gong, Mir M, MD   650 mg at 09/16/22 1853   amantadine (SYMMETREL) capsule 100 mg  100 mg Oral Q breakfast Hollice Gong, Mir M, MD   100 mg at 09/17/22 0800   aspirin EC tablet 325 mg  325 mg Oral Daily Nita Sells, MD   325 mg at 09/17/22 0955   baclofen (LIORESAL) tablet 10 mg  10 mg Oral TID Lucillie Garfinkel, MD   10 mg at 09/17/22 1551   budesonide (PULMICORT) nebulizer solution 0.25 mg  0.25 mg Nebulization BID Hollice Gong, Mir M, MD       buPROPion (WELLBUTRIN XL) 24 hr tablet 150 mg  150 mg Oral Q breakfast Hollice Gong, Mir M, MD   150 mg at 09/17/22 0800   Chlorhexidine Gluconate Cloth 2 % PADS 6 each  6 each Topical Q0600 Lucillie Garfinkel, MD   6 each at 09/17/22 0459   DULoxetine (CYMBALTA) DR capsule 60 mg  60 mg Oral Q breakfast Hollice Gong, Mir M, MD   60 mg at 09/17/22 0801   enoxaparin (LOVENOX) injection 40 mg  40 mg Subcutaneous Q24H Hollice Gong, Mir M, MD   40 mg at 09/16/22 2208   fluticasone (FLONASE) 50 MCG/ACT nasal spray 1 spray  1 spray Each Nare QHS Hollice Gong, Mir M, MD   1 spray at 09/14/22 2137   gabapentin (NEURONTIN) capsule 600 mg  600 mg Oral TID Hollice Gong, Mir M, MD   600 mg at 09/17/22 1551   ipratropium-albuterol (DUONEB) 0.5-2.5 (3) MG/3ML nebulizer solution 3 mL  3 mL Inhalation Q8H PRN Hollice Gong, Mir M, MD       lactobacillus (FLORANEX/LACTINEX) granules 1 g  1 g Oral BID Nita Sells, MD   1 g at 09/17/22 1253   latanoprost (XALATAN) 0.005 %  ophthalmic solution 1 drop  1 drop Both Eyes QHS Hollice Gong, Mir M, MD   1 drop at 09/16/22 2207   linaclotide (LINZESS) capsule 290 mcg  290 mcg Oral QAC breakfast Hollice Gong, Mir M, MD   290 mcg at 09/17/22 0801   magnesium oxide (MAG-OX) tablet 400 mg  400 mg Oral Daily Hollice Gong, Mir M, MD   400 mg at 09/17/22 [id number]   melatonin tablet 10 mg  10 mg Oral QHS Nita Sells, MD   10 mg at 09/16/22 2206   metoprolol succinate (TOPROL-XL) 24 hr tablet 12.5 mg  12.5 mg Oral Q breakfast Nita Sells, MD   12.5 mg at 09/17/22 0800   mupirocin ointment (BACTROBAN) 2 % 1 Application  1 Application Nasal BID Lucillie Garfinkel, MD   1 Application at [id number]   nystatin (MYCOSTATIN/NYSTOP) topical powder 1 Application  1 Application Topical BID Lucillie Garfinkel, MD   1 Application at [id number]   ondansetron (  ZOFRAN) tablet 4 mg  4 mg Oral Q6H PRN Hollice Gong, Mir M, MD       Or   ondansetron [facility]) injection 4 mg  4 mg Intravenous Q6H PRN Hollice Gong, Mir M, MD       oxyCODONE (Oxy IR/ROXICODONE) immediate release tablet 5 mg  5 mg Oral Q4H PRN Hollice Gong, Mir M, MD   5 mg at 09/16/22 1537   polyethylene glycol (MIRALAX / GLYCOLAX) packet 17 g  17 g Oral Daily Nita Sells, MD   17 g at 09/17/22 0954   potassium chloride (KLOR-CON M) CR tablet 20 mEq  20 mEq Oral Q breakfast Hollice Gong, Mir M, MD   20 mEq at 09/17/22 0800   simethicone (MYLICON) chewable tablet 120 mg  120 mg Oral Q6H PRN Nita Sells, MD       simvastatin (ZOCOR) tablet 10 mg  10 mg Oral QHS Hollice Gong, Mir M, MD   10 mg at 09/16/22 2206   sterile water for irrigation for irrigation 30 mL  30 mL Irrigation QHS Nita Sells, MD   30 mL at 09/16/22 2207   traMADol (ULTRAM) tablet 100 mg  100 mg Oral QHS Hollice Gong, Mir M, MD   100 mg at 09/16/22 2206   traZODone (DESYREL) tablet 25 mg  25 mg Oral QHS PRN Lucillie Garfinkel, MD         Discharge Medications: Please see discharge summary for a  list of discharge medications.  Relevant Imaging Results:  Relevant Lab Results:   Additional Information [place], [place]

## 2022-09-17 NOTE — Progress Notes (Signed)
Orthopedic Tech Progress Note Patient Details:  Shannon Garrett 1961-04-29 [id number]  Patient ID: Shannon Garrett, female   DOB: Jul 29, 1960, 61 y.o.   MRN: [id number] Order placed with [facility] for resting hand splint. Vernona Rieger 09/17/2022, 2:43 PM

## 2022-09-18 DIAGNOSIS — T83511A Infection and inflammatory reaction due to indwelling urethral catheter, initial encounter: Secondary | ICD-10-CM | POA: Diagnosis not present

## 2022-09-18 DIAGNOSIS — N390 Urinary tract infection, site not specified: Secondary | ICD-10-CM | POA: Diagnosis not present

## 2022-09-18 LAB — GLUCOSE, CAPILLARY
Glucose-Capillary: 71 mg/dL (ref 70–99)
Glucose-Capillary: 107 mg/dL — ABNORMAL HIGH (ref 70–99)
Glucose-Capillary: 212 mg/dL — ABNORMAL HIGH (ref 70–99)

## 2022-09-18 LAB — COMPREHENSIVE METABOLIC PANEL
ALT: 16 U/L (ref 0–44)
AST: 20 U/L (ref 15–41)
Albumin: 3.2 g/dL — ABNORMAL LOW (ref 3.5–5.0)
Alkaline Phosphatase: 113 U/L (ref 38–126)
Anion gap: 10 (ref 5–15)
BUN: 14 mg/dL (ref 6–20)
CO2: 25 mmol/L (ref 22–32)
Calcium: 8.6 mg/dL — ABNORMAL LOW (ref 8.9–10.3)
Chloride: 106 mmol/L (ref 98–111)
Creatinine, Ser: 0.73 mg/dL (ref 0.44–1.00)
GFR, Estimated: >60 mL/min (ref >60)
Glucose, Bld: 73 mg/dL (ref 70–99)
Potassium: 4.2 mmol/L (ref 3.5–5.1)
Sodium: 141 mmol/L (ref 135–145)
Total Bilirubin: 0.3 mg/dL (ref 0.3–1.2)
Total Protein: 6.4 g/dL — ABNORMAL LOW (ref 6.5–8.1)

## 2022-09-18 LAB — CBC
HCT: 35.4 % — ABNORMAL LOW (ref 36.0–46.0)
Hemoglobin: 11.6 g/dL — ABNORMAL LOW (ref 12.0–15.0)
MCH: 28.4 pg (ref 26.0–34.0)
MCHC: 32.8 g/dL (ref 30.0–36.0)
MCV: 86.8 fL (ref 80.0–100.0)
Platelets: 181 10*3/uL (ref 150–400)
RBC: 4.08 MIL/uL (ref 3.87–5.11)
RDW: 15.8 % — ABNORMAL HIGH (ref 11.5–15.5)
WBC: 6 10*3/uL (ref 4.0–10.5)
nRBC: 0 % (ref 0.0–0.2)

## 2022-09-18 MED ORDER — TRAMADOL HCL 50 MG PO TABS: 100.0000 mg | ORAL_TABLET | Freq: Two times a day (BID) | ORAL | 0 refills | Status: AC | PRN

## 2022-09-18 MED ORDER — BACLOFEN 10 MG PO TABS: 10.0000 mg | ORAL_TABLET | Freq: Four times a day (QID) | ORAL | 0 refills | Status: AC

## 2022-09-18 MED ORDER — FLEET ENEMA 7-19 GM/118ML RE ENEM: 1.0000 | ENEMA | RECTAL | 0 refills | Status: AC

## 2022-09-18 MED ORDER — GABAPENTIN 600 MG PO TABS: 600.0000 mg | ORAL_TABLET | Freq: Three times a day (TID) | ORAL | 0 refills | Status: AC

## 2022-09-18 MED ORDER — METOPROLOL SUCCINATE ER 25 MG PO TB24: 12.5000 mg | ORAL_TABLET | Freq: Every day | ORAL | 0 refills | Status: AC

## 2022-09-18 MED ORDER — POLYETHYLENE GLYCOL 3350 17 G PO PACK: 17.0000 g | PACK | Freq: Every day | ORAL | 0 refills | Status: AC

## 2022-09-18 MED ORDER — DULOXETINE HCL 20 MG PO CPEP: 60.0000 mg | ORAL_CAPSULE | Freq: Every morning | ORAL | 0 refills | Status: AC

## 2022-09-18 MED ORDER — BUPROPION HCL ER (XL) 150 MG PO TB24: 150.0000 mg | ORAL_TABLET | Freq: Every morning | ORAL | 0 refills | Status: AC

## 2022-09-18 MED ORDER — MELATONIN 5 MG PO TABS: 5.0000 mg | ORAL_TABLET | Freq: Every day | ORAL | 0 refills | Status: AC

## 2022-09-18 NOTE — Progress Notes (Signed)
Pt has been refusing her Pulmicort breathing tx for days.

## 2022-09-18 NOTE — TOC Progression Note (Signed)
Transition of Care [facility]) - Progression Note    Patient Details  Name: Sophina Oberoi MRN: [id number] Date of Birth: 06-20-1961  [facility] CM/SW Contact  Purcell Mouton, RN Phone Number: 09/18/2022, 1:56 PM  Clinical Narrative:     Spoke with [place], Pt may [facility] to [facility]. [facility].   Expected Discharge Plan: Skilled Nursing Facility Barriers to Discharge: Continued Medical Work up  Expected Discharge Plan and [place] arrangements for the past 2 months: [place] Expected Discharge Date: 09/18/22                                     Social Determinants of Health (SDOH) Interventions SDOH Screenings   Food Insecurity: No Food Insecurity (09/14/2022)  Housing: Low Risk  (09/14/2022)  Transportation Needs: No Transportation Needs (09/14/2022)  Utilities: Not At Risk (09/14/2022)  Financial Resource Strain: Low Risk  (02/13/2018)  Physical Activity: Inactive (02/13/2018)  Social Connections: Moderately Isolated (02/13/2018)  Stress: No Stress Concern Present (02/13/2018)  Tobacco Use: Medium Risk (09/14/2022)    Readmission Risk Interventions     No data to display

## 2022-09-18 NOTE — Discharge Summary (Signed)
Physician Discharge Summary  Shannon Garrett [id number] DOB: 1961-03-21 DOA: 09/14/2022  PCP: Pcp, No  Admit date: 09/14/2022 Discharge date: 09/18/2022  Time spent: 47 minutes  Recommendations for Outpatient Follow-up:  [facility] should follow-up with patient in the outpatient setting at [facility] where the patient resides Look at [facility] please for numerous med changes inclusive of changes to diuretics etc. Mandatory MiraLAX, mandatory senna DAILY-patient has MS cannot pass stool regularly and if she does not pass stool at least every 3 days, mandatory fleets enema should be given--if patient has diarrhea for more than 24 hours, hold all laxatives and reassess subsequent day Requires Chem-12 CBC in about 1 to 2 weeks Suggest re-referral back to her urologist in [place] who placed her suprapubic catheter-suprapubic catheter is malpositioned,--- this does need to be replaced every 2 weeks---- can easily get clogged as it is under her pannus-May be worthwhile to at least have a discussion with him with regards to remedial measures however patient understands  that she is not a good candidate for anesthesia given her MS, EF of 25%  Discharge Diagnoses:  MAIN problem for hospitalization   Suprapubic malfunction Severe constipation status post enema Hypotension secondary to resultant diarrhea Advanced multiple sclerosis since 2022 with chronic bedbound state, suprapubic catheter-dependent on all IADLs and ADLs Mild hypoglycemia Palliative care patient  Please see below for itemized issues addressed in [place]- refer to other progress notes for clarity if needed  Discharge Condition: Guarded  Diet recommendation: Diabetic  Filed Weights   09/14/22 0928  Weight: 89.4 kg    History of present illness:  62 year old black female HFrEF EF 25% + pulmonary HTN--on hospice currently secondary to this Advanced multiple sclerosis with polypharmacy in the past and 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 August Albino note 07/31/2022   Patient brought to [facility] ED by EMS from [facility] house with abdominal pain/suprapubic catheter malfunction-apparently had never been treated for UTI--suprapubic exchanged 3/8-dysuria present Suprapubic catheter exchanged CT abdomen pelvis = constipation stool ball  IV antibiotics given [merrem]   3/17-hypotensive?  2/2 infection versus profuse diarrhea  Hospital Course:  Hypotension developed during hospital stay, was not since consistent with not distributive shock - Only 1 stool today not hypotensive any longer - Discontinue purgatives for now - DC IVF 3/18   Suprapubic catheter malfunction in the setting of MDRO organism - Suprapubic catheter exchanged 3/15 and cultures obtained  -? Colonized given multiple organisms-did receive 4 days of meropenem-discontinue antibiotics --stabilized for discharge after 4 days therapy - On discharge resume Azo cranberry urinary tract - Will need scheduled changes every 2 weeks as per prior protocol - Platelet counts have rebounded   HFrEF EF 25% with pulmonary hypertension on hospice - now on Metoprolol XL 12.5--stop lasix, losartan Aldactone this hospitalization - As needed Lasix only as per orders - Resume aspirin 325, continue oxygen 2 to 4 L   Severe constipation - Milk of magnesium enema given earlier in stay-Stool ball came out -Stopped Colace MOM senna Linzess simethicone 2/2 hypotension and profuse diarrhea causing hypotension -On discharge placed on daily MiraLAX and daily senna .  If no stool every 3 days needs an enema    Advanced multiple  sclerosis since 2022 Chronic bedbound state - Continue Symmetrel 100 a.m., baclofen 10 4 times daily, gabapentin 600 3 times daily   Depression - Continue bupropion 150 XL, Cymbalta 60 daily, as well as trazodone 25 at bedtime as needed sleep  DM TY 2 - Continue SSI, gabapentin 600 3 times daily - CBGs moderately well-controlled during hospital stay---resuming home regimen of sliding scale coverage on discharge, - Placed hypoglycemic protocol   Encounter for palliative care - Patient is on hospice-[facility] liaison has seen the patient - Patient will resume hospice services on return to [facility]  Sacral decubiti present prior to admission -Follow wound care instructions-needs to be turned at least every 2-3 hours  Discharge Exam: Vitals:   09/18/22 0610 09/18/22 0837  BP: 113/64 118/66  Pulse: 88 90  Resp: 15   Temp: 97.9 F (36.6 C)   SpO2: 100%     Subj on day of d/c   Patient awake coherent pleasant sitting up no fevers no chills  General Exam on discharge  EOMI NCAT no focal deficit no icterus no pallor Neck thick Mallampati 4 S1-S2 no murmur sinus on exam Abdomen is obese nontender no HSM Suprapubic catheter is under her pannus No lower extremity edema-did not examine sacrum today   Discharge Instructions   Discharge Instructions     Diet - low sodium heart healthy   Complete by: As directed    Discharge wound care:   Complete by: As directed    Foam dressing  Every 3 days     Comments: Silicone foam dressing to protect healed ischial tuberosity wound, change every 3 days. Assess under dressings each shift for any acute changes in the wounds.   Increase activity slowly   Complete by: As directed       Allergies as of 09/18/2022       Reactions   Sulfonamide Derivatives Hives, Shortness Of Breath   Penicillins Hives, Itching, Other (See Comments)   Prior course of rocephin charted 05/2015 Also note has tolerated Zosyn in  past 2016 and 2017 "Allergic," per [facility]        Medication List     STOP taking these medications    acetaminophen 325 MG tablet Commonly known as: TYLENOL   bisacodyl 10 MG suppository Commonly known as: DULCOLAX   linaclotide 290 MCG Caps capsule Commonly known as: LINZESS   losartan 25 MG tablet Commonly known as: COZAAR   magnesium oxide 400 (240 Mg) MG tablet Commonly known as: MAG-OX   MULTIVITAMIN ADULT PO   simethicone 125 MG chewable tablet Commonly known as: MYLICON   spironolactone 25 MG tablet Commonly known as: ALDACTONE       TAKE these medications    albuterol (2.5 MG/3ML) 0.083% nebulizer solution Commonly known as: PROVENTIL Take 2.5 mg by nebulization every 6 (six) hours as needed for wheezing or shortness of breath. What changed: Another medication with the same name was removed. Continue taking this medication, and follow the directions you see here.   amantadine 100 MG capsule Commonly known as: SYMMETREL Take 100 mg by mouth in the morning.   ascorbic acid 500 MG tablet Commonly known as: VITAMIN C Take 500 mg by mouth in the morning.   aspirin EC 325 MG tablet Take 325 mg by mouth in the morning. What changed: Another medication with the same name was removed. Continue taking this medication, and follow the directions you see here.   AZO Cranberry Urinary Tract 250-60 MG Caps Generic  drug: Cranberry-Vitamin C Take 2 capsules by mouth in the morning.   baclofen 10 MG tablet Commonly known as: LIORESAL Take 1 tablet (10 mg total) by mouth 4 (four) times daily.   budesonide 0.25 MG/2ML nebulizer solution Commonly known as: Pulmicort Take 2 mLs (0.25 mg total) by nebulization 2 (two) times daily.   buPROPion 150 MG 24 hr tablet Commonly known as: WELLBUTRIN XL Take 1 tablet (150 mg total) by mouth in the morning.   DULoxetine 20 MG capsule Commonly known as: CYMBALTA Take 3 capsules (60 mg total) by mouth in the morning.    ferrous sulfate 325 (65 FE) MG tablet Take 325 mg by mouth 2 (two) times daily with a meal.   fluticasone 50 MCG/ACT nasal spray Commonly known as: FLONASE Place 2 sprays into both nostrils at bedtime.   furosemide 20 MG tablet Commonly known as: LASIX Take 1 tablet (20 mg total) by mouth 2 (two) times daily. MAY TAKE ADDITIONAL 30MG  PRN WEIGHT GAIN OR SWELLING What changed:  how much to take when to take this additional instructions   gabapentin 600 MG tablet Commonly known as: NEURONTIN Take 1 tablet (600 mg total) by mouth 3 (three) times daily for 9 days. What changed: when to take this   HumaLOG KwikPen 100 UNIT/ML KwikPen Generic drug: insulin lispro Inject 2-15 Units into the skin See admin instructions. Inject 2-15 units into the skin three times before meals and at bedtime, per sliding scale: BGL 121-150 = 2 units; 151-200 = 3 units; 201-250 = 5 units; 251-300 = 8 units; 301-350 = 11 units; 351-400 = 15 units   ipratropium-albuterol 0.5-2.5 (3) MG/3ML Soln Commonly known as: DUONEB Inhale 3 mLs into the lungs every 8 (eight) hours as needed (wheezing or shortness of breath).   latanoprost 0.005 % ophthalmic solution Commonly known as: XALATAN Place 1 drop into both eyes at bedtime.   melatonin 5 MG Tabs Take 1 tablet (5 mg total) by mouth at bedtime. What changed: how much to take   metoprolol succinate 25 MG 24 hr tablet Commonly known as: TOPROL-XL Take 0.5 tablets (12.5 mg total) by mouth daily with breakfast. Start taking on: September 19, 2022 What changed:  medication strength how much to take additional instructions Another medication with the same name was removed. Continue taking this medication, and follow the directions you see here.   NONFORMULARY OR COMPOUNDED ITEM Apply 1 application  topically 3 (three) times daily. A&D/Balmex/Lidocaine 1:1:1. Apply to vulva 3 times daily.   NovoLOG FlexPen 100 UNIT/ML FlexPen Generic drug: insulin aspart 0-15  Units, Subcutaneous, 3 times daily with meals CBG < 70: implement hypoglycemia protocol-call MD CBG 70 - 120: 0 units CBG 121 - 150: 2 units CBG 151 - 200: 3 units CBG 201 - 250: 5 units CBG 251 - 300: 8 units CBG 301 - 350: 11 units CBG 351 - 400: 15 units CBG > 400:   nystatin powder Commonly known as: MYCOSTATIN/NYSTOP Apply 1 Application topically See admin instructions. Apply to vagina and armpits 2 times a day   ondansetron 4 MG tablet Commonly known as: ZOFRAN Take 4 mg by mouth every 6 (six) hours as needed for nausea or vomiting.   OXYGEN Inhale 2-4 L/min into the lungs as needed (for shortness of breath and to keep sats at 93% or greater).   polyethylene glycol 17 g packet Commonly known as: MiraLax Mix-In Pax Take 17 g by mouth daily. What changed: when to take this  Potassium Chloride ER 20 MEQ Tbcr Take 20 mEq by mouth in the morning.   PROBIOTIC PO Take 1 capsule by mouth 2 (two) times daily.   senna-docusate 8.6-50 MG tablet Commonly known as: Senokot-S Take 2 tablets by mouth 2 (two) times daily.   simvastatin 10 MG tablet Commonly known as: ZOCOR Take 10 mg by mouth at bedtime.   sodium phosphate 7-19 GM/118ML Enem Place 133 mLs (1 enema total) rectally every 3 (three) days.   sterile water for irrigation Irrigate with 30 mLs as directed See admin instructions. Flush suprapubic catheter with 30 ml's sterile water every night   traMADol 50 MG tablet Commonly known as: ULTRAM Take 2 tablets (100 mg total) by mouth 2 (two) times daily as needed for up to 10 days (pain). What changed:  when to take this reasons to take this               Discharge Care Instructions  (From admission, onward)           Start     Ordered   09/18/22 0000  Discharge wound care:       Comments: Foam dressing  Every 3 days     Comments: Silicone foam dressing to protect healed ischial tuberosity wound, change every 3 days. Assess under dressings each  shift for any acute changes in the wounds.   09/18/22 1148           Allergies  Allergen Reactions   Sulfonamide Derivatives Hives and Shortness Of Breath   Penicillins Hives, Itching and Other (See Comments)    Prior course of rocephin charted 05/2015 Also note has tolerated Zosyn in past 2016 and 2017 "Allergic," per [facility]      The results of significant diagnostics from this hospitalization (including imaging, microbiology, ancillary and laboratory) are listed below for reference.    Significant Diagnostic Studies: CT ABDOMEN PELVIS W CONTRAST  Result Date: 09/14/2022 CLINICAL DATA:  Bowel obstruction suspected Not proving, dysuria, suprapubic catheter not working with urine leaking around it. Concern for UTI, [place], or bowel obstruction EXAM: CT ABDOMEN AND PELVIS WITH CONTRAST TECHNIQUE: Multidetector CT imaging of the abdomen and pelvis was performed using the standard protocol following bolus administration of intravenous contrast. RADIATION DOSE REDUCTION: This exam was performed according to the departmental dose-optimization program which includes automated exposure control, adjustment of the mA and/or kV according to patient size and/or use of iterative reconstruction technique. CONTRAST:  162mL OMNIPAQUE IOHEXOL 300 MG/ML  SOLN COMPARISON:  CT AP, 01/05/2022 and 06/21/2019 FINDINGS: Lower chest: Cardiomegaly, with LEFT ventricular enlargement. Trace LEFT basilar and para-aortic atelectasis. Hepatobiliary: No focal liver abnormality is seen. Status post cholecystectomy. Mild biliary ductal distention, likely physiologic. No overt biliary dilatation. Pancreas: No pancreatic ductal dilatation or surrounding inflammatory changes. Spleen: Normal in size without focal abnormality. Adrenals/Urinary Tract: Adrenal glands are unremarkable. Small (sub-1 cm) hypodense renal cortical hypodensities, likely small cysts. Kidneys are normal, without renal calculi or hydronephrosis. Mild relative  urinary bladder wall thickening for degree of distention. No pericystic stranding. Stomach/Bowel: Stomach is within normal limits. Appendix appears normal. Mild diverticulosis of distal transverse colon. Rectal distention with stool. No perirectal stranding. No evidence of bowel wall thickening, distention, or inflammatory changes. Vascular/Lymphatic: Aortic atherosclerosis without aneurysmal dilatation. No enlarged abdominal or pelvic lymph nodes. Reproductive: Large degenerated and calcified fibroid. Adnexa are unremarkable. Other: Laxity of the rectus sheath. Small fat-containing RIGHT inguinal hernia versus cord lipoma. No abdominopelvic ascites. Musculoskeletal: No acute or significant  osseous findings. No follow-up is indicated for incidental findings, unless specifically mentioned. IMPRESSION: 1. No acute abdominopelvic process. 2. Rectal distention with stool, correlate for potential impaction. No overt findings to suggest stercoral colitis. 3. Suprapubic catheter within decompressed urinary bladder, with mild bladder wall thickening. Correlate with urinalysis. 4. Colonic diverticulosis and Aortic Atherosclerosis (ICD10-I70.0). Additional incidental, chronic and senescent findings as above. Electronically Signed   By: Michaelle Birks M.D.   On: 09/14/2022 13:40    Microbiology: Recent Results (from the past 240 hour(s))  Blood culture (routine x 2)     Status: None (Preliminary result)   Collection Time: 09/14/22 10:30 AM   Specimen: BLOOD  Result Value Ref Range Status   Specimen Description   Final    BLOOD LEFT ANTECUBITAL Performed at [facility] [address] [place], [place] [address]    Special Requests   Final    BOTTLES DRAWN AEROBIC AND ANAEROBIC Blood Culture adequate volume Performed at [facility] [address] [place], [place] [address]    Culture   Final    NO GROWTH 4 DAYS Performed at [facility], [place] [address],  [place], [place] [address]    Report Status PENDING  Incomplete  Blood culture (routine x 2)     Status: None (Preliminary result)   Collection Time: 09/14/22 10:50 AM   Specimen: BLOOD  Result Value Ref Range Status   Specimen Description   Final    BLOOD SITE NOT SPECIFIED Performed at [facility] [address], [place], [place] [address]    Special Requests   Final    BOTTLES DRAWN AEROBIC AND ANAEROBIC Blood Culture adequate volume Performed at [facility] [address], [place], [place] [address]    Culture   Final    NO GROWTH 4 DAYS Performed at [facility], [place] [address] [place], [place] [address]    Report Status PENDING  Incomplete  Urine Culture     Status: Abnormal (Preliminary result)   Collection Time: 09/14/22 11:16 AM   Specimen: Urine, Suprapubic  Result Value Ref Range Status   Specimen Description   Final    URINE, SUPRAPUBIC Performed at [facility] [address] [place], [place] [address]    Special Requests   Final    NONE Performed at [facility], [place] [address] [place], [place] [address]    Culture (A)  Final    >=100,000 COLONIES/mL PROTEUS MIRABILIS >=100,000 COLONIES/mL MORGANELLA MORGANII 40,000 COLONIES/mL KLEBSIELLA PNEUMONIAE >=100,000 COLONIES/mL ESCHERICHIA COLI SUSCEPTIBILITIES TO FOLLOW Performed at [facility], [place] [address], [place], [place] [address]    Report Status PENDING  Incomplete   Organism ID, Bacteria PROTEUS MIRABILIS (A)  Final   Organism ID, Bacteria MORGANELLA MORGANII (A)  Final   Organism ID, Bacteria KLEBSIELLA PNEUMONIAE (A)  Final      Susceptibility   Klebsiella pneumoniae - MIC*    AMPICILLIN RESISTANT Resistant     CEFAZOLIN <=4 SENSITIVE Sensitive     CEFEPIME <=0.12 SENSITIVE Sensitive     CEFTRIAXONE <=0.25 SENSITIVE Sensitive     CIPROFLOXACIN <=0.25 SENSITIVE Sensitive     GENTAMICIN <=1 SENSITIVE  Sensitive     IMIPENEM <=0.25 SENSITIVE Sensitive     NITROFURANTOIN 64 INTERMEDIATE Intermediate     TRIMETH/SULFA <=20 SENSITIVE Sensitive     AMPICILLIN/SULBACTAM 4 SENSITIVE Sensitive     PIP/TAZO <=4 SENSITIVE Sensitive     *  40,000 COLONIES/mL KLEBSIELLA PNEUMONIAE   Morganella morganii - MIC*    AMPICILLIN >=32 RESISTANT Resistant     CIPROFLOXACIN <=0.25 SENSITIVE Sensitive     GENTAMICIN <=1 SENSITIVE Sensitive     IMIPENEM 1 SENSITIVE Sensitive     NITROFURANTOIN 128 RESISTANT Resistant     TRIMETH/SULFA >=320 RESISTANT Resistant     AMPICILLIN/SULBACTAM >=32 RESISTANT Resistant     PIP/TAZO <=4 SENSITIVE Sensitive     * >=100,000 COLONIES/mL MORGANELLA MORGANII   Proteus mirabilis - MIC*    AMPICILLIN >=32 RESISTANT Resistant     CEFAZOLIN >=64 RESISTANT Resistant     CEFEPIME 0.25 SENSITIVE Sensitive     CEFTRIAXONE 1 SENSITIVE Sensitive     CIPROFLOXACIN <=0.25 SENSITIVE Sensitive     GENTAMICIN <=1 SENSITIVE Sensitive     IMIPENEM 4 SENSITIVE Sensitive     NITROFURANTOIN 128 RESISTANT Resistant     TRIMETH/SULFA <=20 SENSITIVE Sensitive     AMPICILLIN/SULBACTAM >=32 RESISTANT Resistant     PIP/TAZO <=4 SENSITIVE Sensitive     * >=100,000 COLONIES/mL PROTEUS MIRABILIS  MRSA Next Gen by PCR, Nasal     Status: Abnormal   Collection Time: 09/14/22 11:59 PM   Specimen: Nasal Mucosa; Nasal Swab  Result Value Ref Range Status   MRSA by PCR Next Gen DETECTED (A) NOT DETECTED Final    Comment: CRITICAL RESULT CALLED TO, READ BACK BY AND VERIFIED WITH: Carmin Muskrat RN @ [phone] 09/15/22. GILBERTL (NOTE) The GeneXpert MRSA Assay (FDA approved for NASAL specimens only), is one component of a comprehensive MRSA colonization surveillance program. It is not intended to diagnose MRSA infection nor to guide or monitor treatment for MRSA infections. Test performance is not FDA approved in patients less than 63 years old. Performed at [facility], [place] [address], [place], [place] [address]      Labs: Basic Metabolic Panel: Recent Labs  Lab 09/14/22 1030 09/14/22 1800 09/16/22 0846 09/17/22 0858 09/18/22 0607  NA 141  --  139 141 141  K 4.0  --  3.7 4.1 4.2  CL 100  --  106 107 106  CO2 30  --  26 28 25   GLUCOSE 82  --  90 74 73  BUN 19  --  15 14 14   CREATININE 0.84 0.80 0.72 0.65 0.73  CALCIUM 9.2  --  8.5* 8.7* 8.6*   Liver Function Tests: Recent Labs  Lab 09/14/22 1030 09/17/22 0858 09/18/22 0607  AST 21 19 20   ALT 18 18 16   ALKPHOS 144* 116 113  BILITOT 0.5 0.5 0.3  PROT 7.8 6.7 6.4*  ALBUMIN 4.1 3.4* 3.2*   Recent Labs  Lab 09/14/22 1030  LIPASE 26   No results for input(s): "AMMONIA" in the last 168 hours. CBC: Recent Labs  Lab 09/14/22 1030 09/14/22 1800 09/15/22 0801 09/17/22 0858 09/18/22 0607  WBC 5.9 5.3 5.9 6.3 6.0  NEUTROABS 3.3  --   --   --   --   HGB 12.8 12.8 12.1 11.3* 11.6*  HCT 40.1 39.6 38.0 35.4* 35.4*  MCV 87.6 87.0 89.0 88.9 86.8  PLT 243 257 135* 189 181   Cardiac Enzymes: No results for input(s): "CKTOTAL", "CKMB", "CKMBINDEX", "TROPONINI" in the last 168 hours. BNP: BNP (last 3 results) Recent Labs    05/01/22 1610 07/01/22 1617  BNP 1,272.9* 3,102.7*    ProBNP (last 3 results) No results for input(s): "PROBNP" in the last 8760 hours.  CBG: Recent Labs  Lab 09/17/22  2113 09/17/22 2342 09/18/22 0604 09/18/22 0803 09/18/22 1137  GLUCAP 159* 90 71 107* 212*       Signed:  Nita Sells MD   [facility] 09/18/2022, 11:48 AM

## 2022-09-18 NOTE — Progress Notes (Signed)
Nursing report called and given to accepting nurse at [facility]. Patient awaiting [facility] for transport. Patient called her daughter to inform her that she is being transported back to [facility].

## 2022-09-19 LAB — CULTURE, BLOOD (ROUTINE X 2)
Culture: NO GROWTH
Culture: NO GROWTH
Special Requests: ADEQUATE
Special Requests: ADEQUATE

## 2022-09-19 LAB — URINE CULTURE: Culture: >=100000 — AB

## 2022-12-01 DEATH — deceased

## 2023-07-04 ENCOUNTER — Telehealth: Payer: Self-pay | Admitting: Neurology

## 2023-07-04 NOTE — Telephone Encounter (Signed)
 Sympathy card placed on Dr Bonnita Hollow desk.

## 2023-07-04 NOTE — Telephone Encounter (Signed)
 Pt passed away 11/15/22.

## 2023-07-11 ENCOUNTER — Ambulatory Visit: Payer: 59 | Admitting: Neurology
